# Patient Record
Sex: Male | Born: 1935
Health system: Southern US, Community
[De-identification: ages and names within clinical notes are randomized; demographics above are authoritative.]

## PROBLEM LIST (undated history)

## (undated) DIAGNOSIS — T8859XA Other complications of anesthesia, initial encounter: Secondary | ICD-10-CM

## (undated) DIAGNOSIS — R338 Other retention of urine: Secondary | ICD-10-CM

## (undated) DIAGNOSIS — G629 Polyneuropathy, unspecified: Secondary | ICD-10-CM

## (undated) DIAGNOSIS — N401 Enlarged prostate with lower urinary tract symptoms: Secondary | ICD-10-CM

## (undated) DIAGNOSIS — K259 Gastric ulcer, unspecified as acute or chronic, without hemorrhage or perforation: Secondary | ICD-10-CM

## (undated) DIAGNOSIS — C801 Malignant (primary) neoplasm, unspecified: Secondary | ICD-10-CM

## (undated) DIAGNOSIS — D649 Anemia, unspecified: Secondary | ICD-10-CM

## (undated) DIAGNOSIS — J449 Chronic obstructive pulmonary disease, unspecified: Secondary | ICD-10-CM

## (undated) DIAGNOSIS — I729 Aneurysm of unspecified site: Secondary | ICD-10-CM

## (undated) DIAGNOSIS — M1990 Unspecified osteoarthritis, unspecified site: Secondary | ICD-10-CM

## (undated) DIAGNOSIS — G2581 Restless legs syndrome: Secondary | ICD-10-CM

## (undated) DIAGNOSIS — D126 Benign neoplasm of colon, unspecified: Secondary | ICD-10-CM

## (undated) DIAGNOSIS — K222 Esophageal obstruction: Secondary | ICD-10-CM

## (undated) DIAGNOSIS — K219 Gastro-esophageal reflux disease without esophagitis: Secondary | ICD-10-CM

## (undated) DIAGNOSIS — K2270 Barrett's esophagus without dysplasia: Secondary | ICD-10-CM

## (undated) DIAGNOSIS — D352 Benign neoplasm of pituitary gland: Secondary | ICD-10-CM

## (undated) DIAGNOSIS — F419 Anxiety disorder, unspecified: Secondary | ICD-10-CM

## (undated) DIAGNOSIS — K5790 Diverticulosis of intestine, part unspecified, without perforation or abscess without bleeding: Secondary | ICD-10-CM

## (undated) DIAGNOSIS — K449 Diaphragmatic hernia without obstruction or gangrene: Secondary | ICD-10-CM

## (undated) DIAGNOSIS — K922 Gastrointestinal hemorrhage, unspecified: Secondary | ICD-10-CM

## (undated) DIAGNOSIS — T4145XA Adverse effect of unspecified anesthetic, initial encounter: Secondary | ICD-10-CM

## (undated) DIAGNOSIS — E538 Deficiency of other specified B group vitamins: Secondary | ICD-10-CM

## (undated) DIAGNOSIS — F329 Major depressive disorder, single episode, unspecified: Secondary | ICD-10-CM

## (undated) DIAGNOSIS — I4891 Unspecified atrial fibrillation: Secondary | ICD-10-CM

## (undated) DIAGNOSIS — E039 Hypothyroidism, unspecified: Secondary | ICD-10-CM

## (undated) DIAGNOSIS — I2510 Atherosclerotic heart disease of native coronary artery without angina pectoris: Secondary | ICD-10-CM

## (undated) DIAGNOSIS — J45909 Unspecified asthma, uncomplicated: Secondary | ICD-10-CM

## (undated) DIAGNOSIS — F32A Depression, unspecified: Secondary | ICD-10-CM

## (undated) DIAGNOSIS — R269 Unspecified abnormalities of gait and mobility: Secondary | ICD-10-CM

## (undated) DIAGNOSIS — H269 Unspecified cataract: Secondary | ICD-10-CM

## (undated) DIAGNOSIS — M810 Age-related osteoporosis without current pathological fracture: Secondary | ICD-10-CM

## (undated) HISTORY — PX: PROSTATE SURGERY: SHX751

## (undated) HISTORY — DX: Major depressive disorder, single episode, unspecified: F32.9

## (undated) HISTORY — DX: Benign neoplasm of colon, unspecified: D12.6

## (undated) HISTORY — DX: Diaphragmatic hernia without obstruction or gangrene: K44.9

## (undated) HISTORY — DX: Aneurysm of unspecified site: I72.9

## (undated) HISTORY — DX: Atherosclerotic heart disease of native coronary artery without angina pectoris: I25.10

## (undated) HISTORY — PX: JOINT REPLACEMENT: SHX530

## (undated) HISTORY — DX: Gastric ulcer, unspecified as acute or chronic, without hemorrhage or perforation: K25.9

## (undated) HISTORY — PX: CORONARY ANGIOPLASTY: SHX604

## (undated) HISTORY — DX: Restless legs syndrome: G25.81

## (undated) HISTORY — DX: Age-related osteoporosis without current pathological fracture: M81.0

## (undated) HISTORY — DX: Depression, unspecified: F32.A

## (undated) HISTORY — DX: Barrett's esophagus without dysplasia: K22.70

## (undated) HISTORY — DX: Unspecified cataract: H26.9

## (undated) HISTORY — PX: COLONOSCOPY: SHX174

## (undated) HISTORY — DX: Deficiency of other specified B group vitamins: E53.8

## (undated) HISTORY — DX: Anxiety disorder, unspecified: F41.9

## (undated) HISTORY — DX: Benign neoplasm of pituitary gland: D35.2

## (undated) HISTORY — PX: OTHER SURGICAL HISTORY: SHX169

## (undated) HISTORY — PX: HERNIA REPAIR: SHX51

## (undated) HISTORY — DX: Unspecified asthma, uncomplicated: J45.909

## (undated) HISTORY — DX: Unspecified abnormalities of gait and mobility: R26.9

## (undated) HISTORY — PX: POLYPECTOMY: SHX149

## (undated) HISTORY — DX: Diverticulosis of intestine, part unspecified, without perforation or abscess without bleeding: K57.90

## (undated) HISTORY — PX: FOOT SURGERY: SHX648

## (undated) HISTORY — DX: Esophageal obstruction: K22.2

---

## 1998-05-30 ENCOUNTER — Encounter: Payer: Self-pay | Admitting: Emergency Medicine

## 1998-05-30 ENCOUNTER — Emergency Department (HOSPITAL_COMMUNITY): Admission: EM | Admit: 1998-05-30 | Discharge: 1998-05-30 | Payer: Self-pay | Admitting: Emergency Medicine

## 2000-06-28 ENCOUNTER — Encounter: Payer: Self-pay | Admitting: Orthopedic Surgery

## 2000-06-28 ENCOUNTER — Inpatient Hospital Stay (HOSPITAL_COMMUNITY): Admission: RE | Admit: 2000-06-28 | Discharge: 2000-07-01 | Payer: Self-pay | Admitting: Orthopedic Surgery

## 2000-08-09 ENCOUNTER — Ambulatory Visit (HOSPITAL_COMMUNITY): Admission: RE | Admit: 2000-08-09 | Discharge: 2000-08-09 | Payer: Self-pay | Admitting: Orthopedic Surgery

## 2001-10-17 ENCOUNTER — Ambulatory Visit (HOSPITAL_COMMUNITY): Admission: RE | Admit: 2001-10-17 | Discharge: 2001-10-17 | Payer: Self-pay | Admitting: Family Medicine

## 2001-10-17 ENCOUNTER — Encounter: Payer: Self-pay | Admitting: Family Medicine

## 2001-10-19 ENCOUNTER — Encounter: Payer: Self-pay | Admitting: Family Medicine

## 2001-10-19 ENCOUNTER — Ambulatory Visit (HOSPITAL_COMMUNITY): Admission: RE | Admit: 2001-10-19 | Discharge: 2001-10-19 | Payer: Self-pay | Admitting: Family Medicine

## 2002-08-21 ENCOUNTER — Encounter: Payer: Self-pay | Admitting: Gastroenterology

## 2002-08-21 ENCOUNTER — Ambulatory Visit (HOSPITAL_COMMUNITY): Admission: RE | Admit: 2002-08-21 | Discharge: 2002-08-21 | Payer: Self-pay | Admitting: Gastroenterology

## 2004-06-03 ENCOUNTER — Emergency Department (HOSPITAL_COMMUNITY): Admission: EM | Admit: 2004-06-03 | Discharge: 2004-06-03 | Payer: Self-pay | Admitting: Emergency Medicine

## 2004-06-17 ENCOUNTER — Ambulatory Visit: Payer: Self-pay | Admitting: Cardiology

## 2005-01-13 ENCOUNTER — Ambulatory Visit: Payer: Self-pay | Admitting: Gastroenterology

## 2005-03-02 ENCOUNTER — Ambulatory Visit: Payer: Self-pay | Admitting: Cardiology

## 2005-03-04 ENCOUNTER — Ambulatory Visit: Payer: Self-pay

## 2005-03-16 ENCOUNTER — Ambulatory Visit (HOSPITAL_COMMUNITY): Admission: RE | Admit: 2005-03-16 | Discharge: 2005-03-17 | Payer: Self-pay | Admitting: General Surgery

## 2005-03-16 ENCOUNTER — Encounter (INDEPENDENT_AMBULATORY_CARE_PROVIDER_SITE_OTHER): Payer: Self-pay | Admitting: Specialist

## 2005-05-24 ENCOUNTER — Ambulatory Visit: Payer: Self-pay | Admitting: Gastroenterology

## 2005-05-29 LAB — HM COLONOSCOPY

## 2005-05-30 ENCOUNTER — Encounter (INDEPENDENT_AMBULATORY_CARE_PROVIDER_SITE_OTHER): Payer: Self-pay | Admitting: *Deleted

## 2005-05-30 ENCOUNTER — Ambulatory Visit: Payer: Self-pay | Admitting: Gastroenterology

## 2005-07-07 ENCOUNTER — Ambulatory Visit: Payer: Self-pay | Admitting: Gastroenterology

## 2005-08-03 ENCOUNTER — Emergency Department (HOSPITAL_COMMUNITY): Admission: EM | Admit: 2005-08-03 | Discharge: 2005-08-03 | Payer: Self-pay | Admitting: Emergency Medicine

## 2005-08-04 ENCOUNTER — Inpatient Hospital Stay (HOSPITAL_COMMUNITY): Admission: EM | Admit: 2005-08-04 | Discharge: 2005-08-05 | Payer: Self-pay | Admitting: Emergency Medicine

## 2005-08-09 ENCOUNTER — Ambulatory Visit: Payer: Self-pay | Admitting: Internal Medicine

## 2006-02-28 HISTORY — PX: ABDOMINAL HERNIA REPAIR: SHX539

## 2006-03-08 ENCOUNTER — Ambulatory Visit: Payer: Self-pay | Admitting: Cardiology

## 2006-03-20 ENCOUNTER — Ambulatory Visit: Payer: Self-pay

## 2006-03-31 DIAGNOSIS — I2510 Atherosclerotic heart disease of native coronary artery without angina pectoris: Secondary | ICD-10-CM

## 2006-03-31 HISTORY — PX: CORONARY STENT PLACEMENT: SHX1402

## 2006-03-31 HISTORY — DX: Atherosclerotic heart disease of native coronary artery without angina pectoris: I25.10

## 2006-04-25 ENCOUNTER — Ambulatory Visit: Payer: Self-pay | Admitting: Cardiology

## 2006-04-25 ENCOUNTER — Inpatient Hospital Stay (HOSPITAL_COMMUNITY): Admission: EM | Admit: 2006-04-25 | Discharge: 2006-04-26 | Payer: Self-pay | Admitting: Emergency Medicine

## 2006-05-07 ENCOUNTER — Inpatient Hospital Stay (HOSPITAL_COMMUNITY): Admission: EM | Admit: 2006-05-07 | Discharge: 2006-05-08 | Payer: Self-pay | Admitting: Emergency Medicine

## 2006-05-07 ENCOUNTER — Ambulatory Visit: Payer: Self-pay | Admitting: Cardiology

## 2006-05-16 ENCOUNTER — Ambulatory Visit: Payer: Self-pay | Admitting: Gastroenterology

## 2006-05-17 ENCOUNTER — Ambulatory Visit: Payer: Self-pay | Admitting: Gastroenterology

## 2006-05-18 ENCOUNTER — Ambulatory Visit: Payer: Self-pay | Admitting: Cardiology

## 2006-05-19 ENCOUNTER — Ambulatory Visit: Admission: RE | Admit: 2006-05-19 | Discharge: 2006-05-19 | Payer: Self-pay | Admitting: Gastroenterology

## 2006-07-12 ENCOUNTER — Ambulatory Visit: Payer: Self-pay | Admitting: Cardiology

## 2006-09-05 ENCOUNTER — Ambulatory Visit: Payer: Self-pay | Admitting: Internal Medicine

## 2006-09-05 LAB — CONVERTED CEMR LAB
Basophils Absolute: 0.1 10*3/uL (ref 0.0–0.1)
Basophils Relative: 0.9 % (ref 0.0–1.0)
Eosinophils Absolute: 0.1 10*3/uL (ref 0.0–0.6)
Eosinophils Relative: 1.7 % (ref 0.0–5.0)
HCT: 36.2 % — ABNORMAL LOW (ref 39.0–52.0)
Hemoglobin: 12.3 g/dL — ABNORMAL LOW (ref 13.0–17.0)
Lymphocytes Relative: 21.6 % (ref 12.0–46.0)
MCHC: 34.1 g/dL (ref 30.0–36.0)
MCV: 94.5 fL (ref 78.0–100.0)
Monocytes Absolute: 0.7 10*3/uL (ref 0.2–0.7)
Monocytes Relative: 12.1 % — ABNORMAL HIGH (ref 3.0–11.0)
Neutro Abs: 3.9 10*3/uL (ref 1.4–7.7)
Neutrophils Relative %: 63.7 % (ref 43.0–77.0)
Platelets: 197 10*3/uL (ref 150–400)
RBC: 3.82 M/uL — ABNORMAL LOW (ref 4.22–5.81)
RDW: 13.3 % (ref 11.5–14.6)
WBC: 6.1 10*3/uL (ref 4.5–10.5)

## 2006-09-27 ENCOUNTER — Ambulatory Visit: Payer: Self-pay | Admitting: Internal Medicine

## 2006-10-11 ENCOUNTER — Ambulatory Visit: Payer: Self-pay | Admitting: Cardiology

## 2006-11-06 ENCOUNTER — Ambulatory Visit: Payer: Self-pay | Admitting: Internal Medicine

## 2007-01-18 ENCOUNTER — Ambulatory Visit: Payer: Self-pay | Admitting: Cardiology

## 2007-01-22 ENCOUNTER — Ambulatory Visit: Payer: Self-pay | Admitting: Cardiology

## 2007-01-22 LAB — CONVERTED CEMR LAB
BUN: 10 mg/dL (ref 6–23)
Basophils Absolute: 0.1 10*3/uL (ref 0.0–0.1)
Basophils Relative: 1.1 % — ABNORMAL HIGH (ref 0.0–1.0)
CO2: 27 meq/L (ref 19–32)
Calcium: 9 mg/dL (ref 8.4–10.5)
Chloride: 106 meq/L (ref 96–112)
Creatinine, Ser: 1 mg/dL (ref 0.4–1.5)
Eosinophils Absolute: 0.1 10*3/uL (ref 0.0–0.6)
Eosinophils Relative: 1.9 % (ref 0.0–5.0)
GFR calc Af Amer: 94 mL/min
GFR calc non Af Amer: 78 mL/min
Glucose, Bld: 157 mg/dL — ABNORMAL HIGH (ref 70–99)
HCT: 36.1 % — ABNORMAL LOW (ref 39.0–52.0)
Hemoglobin: 12.3 g/dL — ABNORMAL LOW (ref 13.0–17.0)
INR: 0.9 (ref 0.8–1.0)
Lymphocytes Relative: 21.9 % (ref 12.0–46.0)
MCHC: 34.1 g/dL (ref 30.0–36.0)
MCV: 94.5 fL (ref 78.0–100.0)
Monocytes Absolute: 0.5 10*3/uL (ref 0.2–0.7)
Monocytes Relative: 7.6 % (ref 3.0–11.0)
Neutro Abs: 4.5 10*3/uL (ref 1.4–7.7)
Neutrophils Relative %: 67.5 % (ref 43.0–77.0)
Platelets: 204 10*3/uL (ref 150–400)
Potassium: 3.9 meq/L (ref 3.5–5.1)
Prothrombin Time: 11.2 s (ref 10.9–13.3)
RBC: 3.82 M/uL — ABNORMAL LOW (ref 4.22–5.81)
RDW: 13.6 % (ref 11.5–14.6)
Sodium: 138 meq/L (ref 135–145)
WBC: 6.7 10*3/uL (ref 4.5–10.5)
aPTT: 31.4 s — ABNORMAL HIGH (ref 21.7–29.8)

## 2007-01-29 ENCOUNTER — Ambulatory Visit: Payer: Self-pay | Admitting: Internal Medicine

## 2007-01-29 ENCOUNTER — Inpatient Hospital Stay (HOSPITAL_BASED_OUTPATIENT_CLINIC_OR_DEPARTMENT_OTHER): Admission: RE | Admit: 2007-01-29 | Discharge: 2007-01-29 | Payer: Self-pay | Admitting: Cardiology

## 2007-01-29 HISTORY — PX: CARDIAC CATHETERIZATION: SHX172

## 2007-02-05 ENCOUNTER — Telehealth (INDEPENDENT_AMBULATORY_CARE_PROVIDER_SITE_OTHER): Payer: Self-pay | Admitting: *Deleted

## 2007-02-15 ENCOUNTER — Ambulatory Visit: Payer: Self-pay | Admitting: Cardiology

## 2007-05-10 ENCOUNTER — Encounter: Payer: Self-pay | Admitting: Internal Medicine

## 2007-05-16 ENCOUNTER — Ambulatory Visit: Payer: Self-pay | Admitting: Cardiology

## 2007-05-16 ENCOUNTER — Encounter: Payer: Self-pay | Admitting: Internal Medicine

## 2007-05-16 ENCOUNTER — Ambulatory Visit (HOSPITAL_COMMUNITY): Admission: RE | Admit: 2007-05-16 | Discharge: 2007-05-16 | Payer: Self-pay | Admitting: Family Medicine

## 2007-05-18 ENCOUNTER — Emergency Department (HOSPITAL_COMMUNITY): Admission: EM | Admit: 2007-05-18 | Discharge: 2007-05-18 | Payer: Self-pay | Admitting: Emergency Medicine

## 2007-05-22 ENCOUNTER — Encounter: Payer: Self-pay | Admitting: Endocrinology

## 2007-06-05 ENCOUNTER — Ambulatory Visit: Payer: Self-pay | Admitting: Gastroenterology

## 2007-06-07 ENCOUNTER — Encounter: Payer: Self-pay | Admitting: Endocrinology

## 2007-06-14 ENCOUNTER — Encounter: Admission: RE | Admit: 2007-06-14 | Discharge: 2007-06-14 | Payer: Self-pay | Admitting: Neurosurgery

## 2007-06-22 DIAGNOSIS — E785 Hyperlipidemia, unspecified: Secondary | ICD-10-CM | POA: Insufficient documentation

## 2007-06-22 DIAGNOSIS — J449 Chronic obstructive pulmonary disease, unspecified: Secondary | ICD-10-CM | POA: Insufficient documentation

## 2007-06-22 DIAGNOSIS — I2510 Atherosclerotic heart disease of native coronary artery without angina pectoris: Secondary | ICD-10-CM | POA: Insufficient documentation

## 2007-06-22 DIAGNOSIS — J4489 Other specified chronic obstructive pulmonary disease: Secondary | ICD-10-CM | POA: Insufficient documentation

## 2007-06-25 ENCOUNTER — Ambulatory Visit: Payer: Self-pay | Admitting: Endocrinology

## 2007-06-25 DIAGNOSIS — E039 Hypothyroidism, unspecified: Secondary | ICD-10-CM | POA: Insufficient documentation

## 2007-06-25 DIAGNOSIS — D353 Benign neoplasm of craniopharyngeal duct: Secondary | ICD-10-CM

## 2007-06-25 DIAGNOSIS — D352 Benign neoplasm of pituitary gland: Secondary | ICD-10-CM | POA: Insufficient documentation

## 2007-07-04 ENCOUNTER — Encounter: Payer: Self-pay | Admitting: Endocrinology

## 2007-07-05 ENCOUNTER — Encounter: Payer: Self-pay | Admitting: Gastroenterology

## 2007-07-05 ENCOUNTER — Encounter: Payer: Self-pay | Admitting: Internal Medicine

## 2007-07-11 ENCOUNTER — Encounter: Admission: RE | Admit: 2007-07-11 | Discharge: 2007-07-11 | Payer: Self-pay | Admitting: Surgery

## 2007-07-12 ENCOUNTER — Ambulatory Visit: Payer: Self-pay

## 2007-07-19 ENCOUNTER — Telehealth (INDEPENDENT_AMBULATORY_CARE_PROVIDER_SITE_OTHER): Payer: Self-pay | Admitting: *Deleted

## 2007-07-25 ENCOUNTER — Ambulatory Visit: Payer: Self-pay | Admitting: Internal Medicine

## 2007-07-26 ENCOUNTER — Ambulatory Visit: Payer: Self-pay | Admitting: Endocrinology

## 2007-07-26 LAB — CONVERTED CEMR LAB
Cortisol, Plasma: 1.5 ug/dL
Prolactin: 35.3 ng/mL
TSH: 1.38 microintl units/mL (ref 0.35–5.50)

## 2007-07-30 ENCOUNTER — Telehealth: Payer: Self-pay | Admitting: Endocrinology

## 2007-08-01 ENCOUNTER — Ambulatory Visit: Payer: Self-pay | Admitting: Cardiology

## 2007-08-06 ENCOUNTER — Telehealth: Payer: Self-pay | Admitting: Gastroenterology

## 2007-08-15 ENCOUNTER — Ambulatory Visit: Payer: Self-pay | Admitting: Internal Medicine

## 2007-08-15 DIAGNOSIS — R0602 Shortness of breath: Secondary | ICD-10-CM | POA: Insufficient documentation

## 2007-08-21 ENCOUNTER — Telehealth: Payer: Self-pay | Admitting: Internal Medicine

## 2007-09-04 ENCOUNTER — Ambulatory Visit: Admission: RE | Admit: 2007-09-04 | Discharge: 2007-09-04 | Payer: Self-pay | Admitting: Surgery

## 2007-09-10 ENCOUNTER — Ambulatory Visit: Payer: Self-pay | Admitting: Cardiology

## 2007-09-10 ENCOUNTER — Inpatient Hospital Stay (HOSPITAL_COMMUNITY): Admission: EM | Admit: 2007-09-10 | Discharge: 2007-09-12 | Payer: Self-pay | Admitting: Emergency Medicine

## 2007-09-10 ENCOUNTER — Ambulatory Visit: Payer: Self-pay | Admitting: Internal Medicine

## 2007-09-11 ENCOUNTER — Encounter: Payer: Self-pay | Admitting: Cardiology

## 2007-09-14 ENCOUNTER — Ambulatory Visit: Payer: Self-pay | Admitting: Cardiology

## 2007-10-15 ENCOUNTER — Telehealth: Payer: Self-pay | Admitting: Gastroenterology

## 2007-10-16 ENCOUNTER — Ambulatory Visit: Payer: Self-pay | Admitting: Cardiology

## 2007-10-24 ENCOUNTER — Ambulatory Visit: Payer: Self-pay | Admitting: Endocrinology

## 2007-10-24 DIAGNOSIS — I9589 Other hypotension: Secondary | ICD-10-CM | POA: Insufficient documentation

## 2007-10-24 DIAGNOSIS — E221 Hyperprolactinemia: Secondary | ICD-10-CM | POA: Insufficient documentation

## 2007-10-24 LAB — CONVERTED CEMR LAB
BUN: 10 mg/dL (ref 6–23)
CO2: 26 meq/L (ref 19–32)
Calcium: 8.5 mg/dL (ref 8.4–10.5)
Chloride: 114 meq/L — ABNORMAL HIGH (ref 96–112)
Cortisol, Plasma: 24 ug/dL
Cortisol, Plasma: 8.1 ug/dL
Creatinine, Ser: 1 mg/dL (ref 0.4–1.5)
Free T4: 0.9 ng/dL (ref 0.6–1.6)
GFR calc Af Amer: 94 mL/min
GFR calc non Af Amer: 78 mL/min
Glucose, Bld: 108 mg/dL — ABNORMAL HIGH (ref 70–99)
Potassium: 3.8 meq/L (ref 3.5–5.1)
Prolactin: 10.3 ng/mL
Sodium: 141 meq/L (ref 135–145)
TSH: 1.81 microintl units/mL (ref 0.35–5.50)

## 2007-10-29 ENCOUNTER — Encounter: Admission: RE | Admit: 2007-10-29 | Discharge: 2007-10-29 | Payer: Self-pay | Admitting: Endocrinology

## 2007-11-08 ENCOUNTER — Encounter: Admission: RE | Admit: 2007-11-08 | Discharge: 2008-01-09 | Payer: Self-pay | Admitting: Family Medicine

## 2007-11-22 ENCOUNTER — Ambulatory Visit: Payer: Self-pay | Admitting: Internal Medicine

## 2007-11-23 ENCOUNTER — Encounter: Payer: Self-pay | Admitting: Internal Medicine

## 2007-11-23 ENCOUNTER — Encounter: Payer: Self-pay | Admitting: Endocrinology

## 2007-11-27 ENCOUNTER — Encounter: Admission: RE | Admit: 2007-11-27 | Discharge: 2007-11-27 | Payer: Self-pay | Admitting: Family Medicine

## 2008-01-04 ENCOUNTER — Encounter (INDEPENDENT_AMBULATORY_CARE_PROVIDER_SITE_OTHER): Payer: Self-pay | Admitting: Surgery

## 2008-01-04 ENCOUNTER — Inpatient Hospital Stay (HOSPITAL_COMMUNITY): Admission: RE | Admit: 2008-01-04 | Discharge: 2008-01-07 | Payer: Self-pay | Admitting: Surgery

## 2008-01-04 HISTORY — PX: HIATAL HERNIA REPAIR: SHX195

## 2008-01-05 ENCOUNTER — Encounter (INDEPENDENT_AMBULATORY_CARE_PROVIDER_SITE_OTHER): Payer: Self-pay | Admitting: *Deleted

## 2008-01-08 ENCOUNTER — Inpatient Hospital Stay (HOSPITAL_COMMUNITY): Admission: EM | Admit: 2008-01-08 | Discharge: 2008-01-10 | Payer: Self-pay | Admitting: *Deleted

## 2008-01-31 ENCOUNTER — Encounter: Payer: Self-pay | Admitting: Gastroenterology

## 2008-02-06 ENCOUNTER — Ambulatory Visit: Payer: Self-pay | Admitting: Cardiology

## 2008-03-21 ENCOUNTER — Encounter: Admission: RE | Admit: 2008-03-21 | Discharge: 2008-03-21 | Payer: Self-pay | Admitting: Neurosurgery

## 2008-04-17 ENCOUNTER — Encounter: Payer: Self-pay | Admitting: Gastroenterology

## 2008-04-17 ENCOUNTER — Encounter: Payer: Self-pay | Admitting: Internal Medicine

## 2008-05-19 ENCOUNTER — Ambulatory Visit: Payer: Self-pay | Admitting: Internal Medicine

## 2008-07-02 ENCOUNTER — Telehealth: Payer: Self-pay | Admitting: Endocrinology

## 2008-07-17 DIAGNOSIS — D126 Benign neoplasm of colon, unspecified: Secondary | ICD-10-CM

## 2008-07-17 DIAGNOSIS — N400 Enlarged prostate without lower urinary tract symptoms: Secondary | ICD-10-CM | POA: Insufficient documentation

## 2008-07-17 DIAGNOSIS — D649 Anemia, unspecified: Secondary | ICD-10-CM | POA: Insufficient documentation

## 2008-07-17 DIAGNOSIS — K2100 Gastro-esophageal reflux disease with esophagitis, without bleeding: Secondary | ICD-10-CM | POA: Insufficient documentation

## 2008-07-17 DIAGNOSIS — M1990 Unspecified osteoarthritis, unspecified site: Secondary | ICD-10-CM | POA: Insufficient documentation

## 2008-07-17 DIAGNOSIS — K5730 Diverticulosis of large intestine without perforation or abscess without bleeding: Secondary | ICD-10-CM | POA: Insufficient documentation

## 2008-07-17 DIAGNOSIS — M810 Age-related osteoporosis without current pathological fracture: Secondary | ICD-10-CM | POA: Insufficient documentation

## 2008-07-17 DIAGNOSIS — I959 Hypotension, unspecified: Secondary | ICD-10-CM | POA: Insufficient documentation

## 2008-07-17 DIAGNOSIS — D539 Nutritional anemia, unspecified: Secondary | ICD-10-CM | POA: Insufficient documentation

## 2008-07-17 HISTORY — DX: Benign neoplasm of colon, unspecified: D12.6

## 2008-08-05 ENCOUNTER — Telehealth: Payer: Self-pay | Admitting: Endocrinology

## 2008-08-20 ENCOUNTER — Ambulatory Visit: Payer: Self-pay | Admitting: Cardiology

## 2008-08-21 ENCOUNTER — Encounter: Payer: Self-pay | Admitting: Endocrinology

## 2008-08-21 ENCOUNTER — Encounter: Payer: Self-pay | Admitting: Internal Medicine

## 2008-09-05 ENCOUNTER — Telehealth: Payer: Self-pay | Admitting: Endocrinology

## 2008-09-06 ENCOUNTER — Encounter: Admission: RE | Admit: 2008-09-06 | Discharge: 2008-09-06 | Payer: Self-pay | Admitting: Neurosurgery

## 2008-09-11 ENCOUNTER — Ambulatory Visit: Payer: Self-pay | Admitting: Endocrinology

## 2008-09-12 LAB — CONVERTED CEMR LAB
Free T4: 0.9 ng/dL (ref 0.6–1.6)
Prolactin: 10.8 ng/mL
TSH: 1.61 microintl units/mL (ref 0.35–5.50)

## 2008-10-06 ENCOUNTER — Telehealth: Payer: Self-pay | Admitting: Endocrinology

## 2008-10-22 ENCOUNTER — Telehealth: Payer: Self-pay | Admitting: Internal Medicine

## 2008-12-22 ENCOUNTER — Telehealth: Payer: Self-pay | Admitting: Internal Medicine

## 2009-01-01 ENCOUNTER — Encounter: Payer: Self-pay | Admitting: Endocrinology

## 2009-03-03 ENCOUNTER — Telehealth: Payer: Self-pay | Admitting: Endocrinology

## 2009-06-25 ENCOUNTER — Ambulatory Visit: Payer: Self-pay | Admitting: Internal Medicine

## 2009-08-07 ENCOUNTER — Ambulatory Visit: Payer: Self-pay | Admitting: Cardiology

## 2009-08-07 ENCOUNTER — Inpatient Hospital Stay (HOSPITAL_COMMUNITY): Admission: EM | Admit: 2009-08-07 | Discharge: 2009-08-15 | Payer: Self-pay | Admitting: Emergency Medicine

## 2009-08-07 ENCOUNTER — Ambulatory Visit: Payer: Self-pay | Admitting: Gastroenterology

## 2009-08-08 ENCOUNTER — Encounter: Payer: Self-pay | Admitting: Gastroenterology

## 2009-08-09 ENCOUNTER — Encounter: Payer: Self-pay | Admitting: Gastroenterology

## 2009-08-09 DIAGNOSIS — K922 Gastrointestinal hemorrhage, unspecified: Secondary | ICD-10-CM

## 2009-08-09 HISTORY — DX: Gastrointestinal hemorrhage, unspecified: K92.2

## 2009-08-10 ENCOUNTER — Encounter: Payer: Self-pay | Admitting: Cardiology

## 2009-08-10 ENCOUNTER — Encounter: Payer: Self-pay | Admitting: Internal Medicine

## 2009-08-14 ENCOUNTER — Encounter: Payer: Self-pay | Admitting: Internal Medicine

## 2009-08-25 ENCOUNTER — Ambulatory Visit: Payer: Self-pay | Admitting: Gastroenterology

## 2009-08-25 DIAGNOSIS — K219 Gastro-esophageal reflux disease without esophagitis: Secondary | ICD-10-CM | POA: Insufficient documentation

## 2009-08-25 DIAGNOSIS — K921 Melena: Secondary | ICD-10-CM | POA: Insufficient documentation

## 2009-08-26 LAB — CONVERTED CEMR LAB
Basophils Absolute: 0 10*3/uL (ref 0.0–0.1)
Basophils Relative: 0.7 % (ref 0.0–3.0)
Eosinophils Absolute: 0.1 10*3/uL (ref 0.0–0.7)
Eosinophils Relative: 2 % (ref 0.0–5.0)
Ferritin: 42.8 ng/mL (ref 22.0–322.0)
Folate: 7.9 ng/mL
HCT: 31.9 % — ABNORMAL LOW (ref 39.0–52.0)
Hemoglobin: 10.7 g/dL — ABNORMAL LOW (ref 13.0–17.0)
Iron: 72 ug/dL (ref 42–165)
Lymphocytes Relative: 21.3 % (ref 12.0–46.0)
Lymphs Abs: 1.6 10*3/uL (ref 0.7–4.0)
MCHC: 33.7 g/dL (ref 30.0–36.0)
MCV: 90.7 fL (ref 78.0–100.0)
Monocytes Absolute: 0.9 10*3/uL (ref 0.1–1.0)
Monocytes Relative: 12.4 % — ABNORMAL HIGH (ref 3.0–12.0)
Neutro Abs: 4.7 10*3/uL (ref 1.4–7.7)
Neutrophils Relative %: 63.6 % (ref 43.0–77.0)
Platelets: 313 10*3/uL (ref 150.0–400.0)
RBC: 3.51 M/uL — ABNORMAL LOW (ref 4.22–5.81)
RDW: 18.2 % — ABNORMAL HIGH (ref 11.5–14.6)
Saturation Ratios: 22 % (ref 20.0–50.0)
Transferrin: 233.3 mg/dL (ref 212.0–360.0)
Vitamin B-12: 495 pg/mL (ref 211–911)
WBC: 7.4 10*3/uL (ref 4.5–10.5)

## 2009-09-01 ENCOUNTER — Encounter: Payer: Self-pay | Admitting: Gastroenterology

## 2009-09-01 ENCOUNTER — Telehealth: Payer: Self-pay | Admitting: Endocrinology

## 2009-09-01 ENCOUNTER — Encounter: Payer: Self-pay | Admitting: Cardiology

## 2009-09-01 ENCOUNTER — Telehealth: Payer: Self-pay | Admitting: Gastroenterology

## 2009-09-04 ENCOUNTER — Telehealth: Payer: Self-pay | Admitting: Endocrinology

## 2009-09-15 ENCOUNTER — Inpatient Hospital Stay (HOSPITAL_COMMUNITY): Admission: AD | Admit: 2009-09-15 | Discharge: 2009-09-19 | Payer: Self-pay | Admitting: Endocrinology

## 2009-09-15 ENCOUNTER — Ambulatory Visit: Payer: Self-pay | Admitting: Endocrinology

## 2009-09-15 DIAGNOSIS — R509 Fever, unspecified: Secondary | ICD-10-CM | POA: Insufficient documentation

## 2009-09-22 ENCOUNTER — Encounter: Payer: Self-pay | Admitting: Cardiology

## 2009-10-07 ENCOUNTER — Ambulatory Visit: Payer: Self-pay | Admitting: Cardiology

## 2009-10-08 ENCOUNTER — Encounter: Admission: RE | Admit: 2009-10-08 | Discharge: 2009-10-08 | Payer: Self-pay | Admitting: Neurosurgery

## 2009-10-25 ENCOUNTER — Ambulatory Visit: Payer: Self-pay | Admitting: Gastroenterology

## 2009-11-09 ENCOUNTER — Telehealth: Payer: Self-pay | Admitting: Endocrinology

## 2009-11-09 ENCOUNTER — Encounter: Payer: Self-pay | Admitting: Gastroenterology

## 2009-11-17 ENCOUNTER — Ambulatory Visit: Payer: Self-pay | Admitting: Gastroenterology

## 2009-11-17 DIAGNOSIS — K922 Gastrointestinal hemorrhage, unspecified: Secondary | ICD-10-CM | POA: Insufficient documentation

## 2009-12-07 ENCOUNTER — Encounter: Payer: Self-pay | Admitting: Gastroenterology

## 2009-12-07 ENCOUNTER — Encounter: Payer: Self-pay | Admitting: Cardiology

## 2009-12-14 ENCOUNTER — Telehealth: Payer: Self-pay | Admitting: Endocrinology

## 2009-12-15 ENCOUNTER — Ambulatory Visit: Payer: Self-pay | Admitting: Endocrinology

## 2009-12-15 LAB — CONVERTED CEMR LAB: Prolactin: 10.8 ng/mL (ref 2.1–17.1)

## 2009-12-29 LAB — HM DEXA SCAN

## 2010-01-20 ENCOUNTER — Ambulatory Visit: Payer: Self-pay | Admitting: Cardiology

## 2010-02-04 ENCOUNTER — Inpatient Hospital Stay (HOSPITAL_COMMUNITY): Admission: EM | Admit: 2010-02-04 | Discharge: 2009-10-30 | Payer: Self-pay | Admitting: Emergency Medicine

## 2010-03-22 ENCOUNTER — Encounter: Payer: Self-pay | Admitting: Family Medicine

## 2010-03-30 NOTE — Procedures (Signed)
Summary: Upper Endoscopy  Patient: [name] Note: All result statuses are Final unless otherwise noted.  Tests: (1) Upper Endoscopy (EGD)   EGD Upper Endoscopy       DONE     [facility]     [address]     [place], [place]  [address]           ENDOSCOPY PROCEDURE REPORT           PATIENT:  [name], [name]  MR#:  [id number]     BIRTHDATE:  25-Sep-1932  GENDER:     ENDOSCOPIST:  Rachael Fee, MD     PROCEDURE DATE:  10/25/2009     PROCEDURE:  EGD for control of bleeding     ASA CLASS:  Class III     INDICATIONS:  hematemsis, recent 7 unit UGI bleed (June) from     distal esophagus ulcer though possibly due to Alendronate     MEDICATIONS:  Fentanyl 50 mcg IV, Versed 5 mg IV     TOPICAL ANESTHETIC:  Cetacaine Spray     DESCRIPTION OF PROCEDURE:   After the risks benefits and     alternatives of the procedure were thoroughly explained, informed     consent was obtained.  The  endoscope was introduced through the     mouth and advanced to the second portion of the duodenum, without     limitations.  The instrument was slowly withdrawn as the mucosa     was fully examined.           <<PROCEDUREIMAGES>>           Just proximal to GE junction there was an oozing, visible vessel.     The previously noted ulcer in distal esophagus has healed. The     vessel was treated with placement of two endoclips (see image2,     image3, and image4). There was a large amount of fresh blood and     blood clot in his stomach    Retroflexed views revealed not done.     The scope was then withdrawn from the patient and the procedure     completed.     COMPLICATIONS:  None           ENDOSCOPIC IMPRESSION:     Oozing visible vessel just above GE junction.  Previously noted     ulcer has healed, this vessel was treated with two endoclips.           RECOMMENDATIONS:     BID PPI IV for now, then PO for at least two more months, then     probably OK to decrease to once  daily)     Observe in ICU for rebleeding           _____________________________     Rachael Fee, MD           cc: Sheryn Bison, MD           n.     eSIGNED:   Rachael Fee at 10/25/2009 03:44 PM           Biagio Borg, [id number]  Note: An exclamation mark (!) indicates a result that was not dispersed into the flowsheet. Document Creation Date: 10/25/2009 4:16 PM _______________________________________________________________________  (1) Order result status: Final Collection or observation date-time: 10/25/2009 15:37 Requested date-time:  Receipt date-time:  Reported date-time:  Referring Physician:   Ordering Physician: Rob Bunting [phone]) Specimen Source:  Source: Launa Grill Order Number: [id number] Lab site:

## 2010-03-30 NOTE — Procedures (Signed)
Summary: Upper Endoscopy  Patient: [name] Note: All result statuses are Final unless otherwise noted.  Tests: (1) Upper Endoscopy (EGD)   EGD Upper Endoscopy       DONE      [facility]     [address]     [place], [place]  [address]           ENDOSCOPY PROCEDURE REPORT           PATIENT:  [name], [name]  MR#:  [id number]     BIRTHDATE:  05/05/1932, 76 yrs. old  GENDER:  male           ENDOSCOPIST:  Barbette Hair. Arlyce Dice, MD     Referred by:           PROCEDURE DATE:  08/08/2009     PROCEDURE:  EGD with biopsy     ASA CLASS:  Class II     INDICATIONS:  melena           MEDICATIONS:   Fentanyl 25 mcg IV, Versed 3 mg IV     TOPICAL ANESTHETIC:  Cetacaine Spray           DESCRIPTION OF PROCEDURE:   After the risks benefits and     alternatives of the procedure were thoroughly explained, informed     consent was obtained.  The EG-[id number] ([id number]) endoscope was     introduced through the mouth and advanced to the third portion of     the duodenum, without limitations.  The instrument was slowly     withdrawn as the mucosa was fully examined.     <<PROCEDUREIMAGES>>           A stricture was found at the gastroesophageal junction (see     image001). Early esophageal stricture  Esophagitis was found at     the gastroesophageal junction. Erosive esohagitis at GE junction.     No fresh or old blood. There was prominent mucosa on the gastric     side that was biopsied (see image007, image006, and image008).     other findings in the antrum (see image005). pancreatic rest     Otherwise the examination was normal.    Retroflexed views revealed     no abnormalities.    The scope was then withdrawn from the patient     and the procedure completed.           COMPLICATIONS:  None           ENDOSCOPIC IMPRESSION:     1) Stricture at the gastroesophageal junction     2) Esophagitis at the gastroesophageal junction     3) Other findings in the antrum - pancreatic  rest     4) Otherwise normal examination           GI bleeding probably secondary to erosive esophagitis     RECOMMENDATIONS:     1) continue PPI; OK to continue plavix     2) Await biopsy results     3) if GI bleeding persists, t/c colonoscopy           REPEAT EXAM:  No           ______________________________     Barbette Hair. Arlyce Dice, MD           CC:  Rollene Rotunda, MD           n.     Rosalie Doctor:   Barbette Hair. Roxene Alviar at 08/08/2009  09:58 AM           [place], [id number]  Note: An exclamation mark (!) indicates a result that was not dispersed into the flowsheet. Document Creation Date: 08/08/2009 9:59 AM _______________________________________________________________________  (1) Order result status: Final Collection or observation date-time: 08/08/2009 09:53 Requested date-time:  Receipt date-time:  Reported date-time:  Referring Physician:   Ordering Physician: Melvia Heaps [phone]) Specimen Source:  Source: Launa Grill Order Number: [id number] Lab site:

## 2010-03-30 NOTE — Assessment & Plan Note (Signed)
Summary: [facility]  Medications Added MUCINEX 600 MG XR12H-TAB (GUAIFENESIN) 1 by mouth daily      Allergies Added:   Visit Type:  Follow-up Primary Provider:  Rudi Heap, MD  CC:  CAD.  History of Present Illness: The patient presents for evaluation of his known coronary disease. He's been off of Plavix secondary to bleeding birth related to a Mallory-Weiss tear and since I saw him a visible esophageal vessel which needed to be clipped. He is slowly recovering from all of this. He is participating in rehabilitation. He has occasional episodes of feeling unsteady on his feet. He is not describing any chest pressure, neck or arm discomfort. He is not noticing any palpitations. He does have some more anxiety and insomnia and thinks he is a little more confused during the day then he has been previously.  Current Medications (verified): 1)  Singulair 10 Mg  Tabs (Montelukast Sodium) .... Take 1 By Mouth Once Daily As Needed 2)  Pravachol 40 Mg  Tabs (Pravastatin Sodium) .... Take 1 By Mouth Qd 3)  Nitroglycerin 0.4 Mg  Subl (Nitroglycerin) .... Use Prn 4)  Aspirin 81 Mg  Tbec (Aspirin) .... Take 1 By Mouth Qd 5)  Synthroid 50 Mcg  Tabs (Levothyroxine Sodium) .... Qd 6)  Proventil Hfa 108 (90 Base) Mcg/act Aers (Albuterol Sulfate) .... 2 Puffs Four Times A Day As Needed 7)  Fish Oil 1200 Mg Caps (Omega-3 Fatty Acids) .... Take 1 By Mouth Once Daily 8)  Vitamin D 1000 Unit Tabs (Cholecalciferol) .... Take 2 By Mouth Once Daily 9)  Meclizine Hcl 25 Mg Tabs (Meclizine Hcl) .Marland Kitchen.. 1 Two Times A Day As Needed Vertigo 10)  Docusate Sodium 100 Mg Caps (Docusate Sodium) .... Take 1 By Mouth Once Daily 11)  Pantoprazole Sodium 40 Mg Tbec (Pantoprazole Sodium) .... Take One By Mouth Once Daily 12)  Iron 325 (65 Fe) Mg Tabs (Ferrous Sulfate) .... Take One By Mouth Two Times A Day 13)  Multivitamins   Tabs (Multiple Vitamin) .Marland Kitchen.. 1 By Mouth Daily 14)  Bromocriptine Mesylate 2.5 Mg Tabs  (Bromocriptine Mesylate) .Marland Kitchen.. 1 Tab At Bedtime 15)  Mucinex 600 Mg Xr12h-Tab (Guaifenesin) .Marland Kitchen.. 1 By Mouth Daily  Allergies (verified): 1)  ! Pcn  Past History:  Past Medical History: HYPOTENSION (ICD-458.9) DYSPNEA (ICD-786.05) HYPERLIPIDEMIA (ICD-272.4) CORONARY ARTERY DISEASE (ICD-414.00) BENIGN PROSTATIC HYPERTROPHY, HX OF (ICD-V13.8) DIVERTICULOSIS, MILD (ICD-562.10) OSTEOPOROSIS (ICD-733.00) DEGENERATIVE JOINT DISEASE (ICD-715.90) ESOPHAGITIS (ICD-530.10) ANEMIA (ICD-285.9) (GI bleed, MW tear and clipping of visible vessel 2011) COLONIC POLYPS (ICD-211.3) HYPERPROLACTINEMIA (ICD-253.1) OTHER SPECIFIED HYPOTENSION (ICD-458.8) HYPOTHYROIDISM (ICD-244.9) PITUITARY ADENOMA (ICD-227.3) COPD (ICD-496) Bonchiectasis RLL- CT 2009  Past Surgical History: Reviewed history from 10/07/2009 and no changes required. Inguinal herniorrhaphy PTCA/stent LEFT & RIGHT FOOT RECONSTRUCTION Hiatal hernia repair 2009  Review of Systems       As stated in the HPI and negative for all other systems.   Vital Signs:  Patient profile:   77 year old male Height:      67 inches Weight:      169 pounds BMI:     26.56 Pulse rate:   67 / minute Resp:     16 per minute BP sitting:   98 / 62  (right arm)  Vitals Entered By: [name] Coy, CNA (January 20, 2010 1:08 PM)  Physical Exam  General:  Well developed, well nourished, in no acute distress. Head:  Normocephalic and atraumatic. Eyes:  PERRLA/EOM intact; conjunctiva and lids normal. Neck:  Neck supple,  no JVD. No masses, thyromegaly or abnormal cervical nodes. Chest Wall:  no deformities Lungs:  Clear bilaterally to auscultation and percussion. Abdomen:  Bowel sounds positive; abdomen soft and non-tender without masses, organomegaly, or hernias noted. No hepatosplenomegaly. Msk:  Back normal, normal gait. Muscle strength and tone normal. Extremities:  No clubbing or cyanosis. Neurologic:  Alert and oriented x 3. Skin:  Intact  without lesions or rashes. Cervical Nodes:  no significant adenopathy Psych:  Normal affect.   Detailed Cardiovascular Exam  Neck    Carotids: Carotids full and equal bilaterally without bruits.      Neck Veins: Normal, no JVD.    Heart    Inspection: no deformities or lifts noted.      Palpation: normal PMI with no thrills palpable.      Auscultation: regular rate and rhythm, S1, S2 without murmurs, rubs, gallops, or clicks.    Vascular    Abdominal Aorta: no palpable masses, pulsations, or audible bruits.      Femoral Pulses: normal femoral pulses bilaterally.      Pedal Pulses: normal pedal pulses bilaterally.      Radial Pulses: normal radial pulses bilaterally.      Peripheral Circulation: no clubbing, cyanosis, or edema noted with normal capillary refill.     EKG  Procedure date:  01/20/2010  Findings:      Sinus rhythm, rate 68, axis within normal limits, intervals within normal limits, no acute ST-T wave changes  Impression & Recommendations:  Problem # 1:  CORONARY ARTERY DISEASE (ICD-414.00) He is having no ongoing symptoms. I think it is most prudent to continue to hold Plavix given his recent GI bleeds. Orders: EKG w/ Interpretation (93000)  Problem # 2:  HYPOTENSION (ICD-458.9) He continues to have a low blood pressure but no overt for this. At this point no change in therapy is indicated.  Problem # 3:  HYPERLIPIDEMIA (ICD-272.4) Per Dr. Christell Constant with a goal LDL less than 100 and HDL greater than 40.  Patient Instructions: 1)  Your physician recommends that you schedule a follow-up appointment in: 6 month with Dr Antoine Poche in [place] 2)  Your physician recommends that you continue on your current medications as directed. Please refer to the Current Medication list given to you today.

## 2010-03-30 NOTE — Progress Notes (Signed)
  Phone Note Refill Request Message from:  Fax from Pharmacy on August 05, 2008 8:42 AM  Refills Requested: Medication #1:  SYNTHROID 50 MCG  TABS qd      Prescriptions: SYNTHROID 50 MCG  TABS (LEVOTHYROXINE SODIUM) qd  #30 x 0   Entered and Authorized by:   Ami Bullins CMA   Signed by:   Ami Bullins CMA on 08/05/2008   Method used:   Faxed to ...       [facility]       [address]       [place], [place]  [address]       Ph: [phone] or [phone]       Fax: [phone]   RxID:   [id number]

## 2010-03-30 NOTE — Assessment & Plan Note (Signed)
Summary: 2 MTH FU-$50-STC   Vital Signs:  Patient Profile:   77 Years Old Male Weight:      170.6 pounds Temp:     97.0 degrees F oral Pulse rate:   76 / minute BP sitting:   124 / 72  (right arm) Cuff size:   regular  Pt. in pain?   no  Vitals Entered By: Orlan Leavens (October 24, 2007 9:28 AM)                  PCP:  d Hayden Rasmussen  Chief Complaint:  2 MONTH FOLLOW-UP.  History of Present Illness: pt says he sometimes takes only 1/2 of his synthroid, due to nausea. takes parlodel as rx'ed. has been noted by dr hochrein to have hypotension.  his diary at home shows systolic to be 90-113.  pt says this has been present x 40 years, but dr hochrein wants to exclude endocrine cause.     Current Allergies: No known allergies   Past Medical History:    Reviewed history from 07/26/2007 and no changes required:       upcoming esophageal surgery       HYPOTHYROIDISM (ICD-244.9)       PITUITARY ADENOMA (ICD-227.3)       HYPERLIPIDEMIA (ICD-272.4)       CORONARY ARTERY DISEASE (ICD-414.00)       COPD (ICD-496)                   Review of Systems  The patient denies syncope.         has slight dizziness   Physical Exam  General:     well developed, well nourished, in no acute distress Msk:     gait is normal Additional Exam:     cortisol increased from 8 to 24, with cosyntropin  tsh, free t4, and prolactin are all normal    Impression & Recommendations:  Problem # 1:  HYPERPROLACTINEMIA (ICD-253.1)  Problem # 2:  OTHER SPECIFIED HYPOTENSION (ICD-458.8) endocrine cause is excluded  Problem # 3:  PITUITARY ADENOMA (ICD-227.3) ? has this resolved with normalization of the prolactin  Other Orders: TLB-TSH (Thyroid Stimulating Hormone) (84443-TSH) TLB-T4 (Thyrox), Free [id number])   Patient Instructions: 1)  i told pt that his nausea is most likely due to parlodel, not synthroid. 2)  please make every effort to take both meds 3)  recheck mri 4)   ret 6 mos   ]  Medication Administration  Injection # 1:    Medication: Cosyntropin Inj.    Diagnosis: OTHER SPECIFIED HYPOTENSION (ICD-458.8)    Route: IM    Site: R deltoid    Exp Date: 04/2009    Lot #: [id number]    Mfr: amphastar    Patient tolerated injection without complications    Given by: Orlan Leavens (October 24, 2007 10:38 AM)  Orders Added: 1)  TLB-Cortisol [82533-CORT] 2)  Cosyntropin Inj. [J0835] 3)  Admin of Therapeutic Inj  intramuscular or subcutaneous [96372] 4)  TLB-BMP (Basic Metabolic Panel-BMET) [80048-METABOL] 5)  TLB-TSH (Thyroid Stimulating Hormone) [84443-TSH] 6)  TLB-T4 (Thyrox), Free [14782-NF6O] 7)  TLB-Prolactin [84146-PROL] 8)  Radiology Referral [Radiology] 9)  Est. Patient Level IV [13086]

## 2010-03-30 NOTE — Consult Note (Signed)
Summary: [facility]  [facility]   Imported By: Briant Cedar 07/18/2007 10:24:28  _____________________________________________________________________  External Attachment:    Type:   Image     Comment:   External Document

## 2010-03-30 NOTE — Progress Notes (Signed)
  Phone Note Refill Request Message from:  Fax from Pharmacy on September 05, 2008 10:11 AM  Initial call taken by: Ami Bullins CMA,  September 05, 2008 10:11 AM  Follow-up for Phone Call        Do you want to refill this pt's Levothyroxine. Please advise Follow-up by: Ami Bullins CMA,  September 05, 2008 10:12 AM  Additional Follow-up for Phone Call Additional follow up Details #1::        refill x 1 please advise pt to make appt Additional Follow-up by: Minus Breeding MD,  September 05, 2008 10:23 AM      Prescriptions: SYNTHROID 50 MCG  TABS (LEVOTHYROXINE SODIUM) qd  #30 x 0   Entered and Authorized by:   Ami Bullins CMA   Signed by:   Ami Bullins CMA on 09/05/2008   Method used:   Faxed to ...       Hospital doctor (retail)       [address]       [place], [place]  [address]       Ph: [phone] or [phone]       Fax: [phone]   RxID:   [id number]

## 2010-03-30 NOTE — Letter (Signed)
Summary: [facility]   Imported By: Esmeralda Links D'jimraou 05/02/2008 15:00:52  _____________________________________________________________________  External Attachment:    Type:   Image     Comment:   External Document

## 2010-03-30 NOTE — Progress Notes (Signed)
  Phone Note Refill Request Message from:  Fax from Pharmacy on October 06, 2008 11:11 AM  Refills Requested: Medication #1:  SYNTHROID 50 MCG  TABS qd Initial call taken by: Ami Bullins CMA,  October 06, 2008 11:11 AM    Prescriptions: SYNTHROID 50 MCG  TABS (LEVOTHYROXINE SODIUM) qd  #30 x 11   Entered and Authorized by:   Ami Bullins CMA   Signed by:   Ami Bullins CMA on 10/06/2008   Method used:   Faxed to ...       Hospital doctor (retail)       [address]       [place], [place]  [address]       Ph: [phone] or [phone]       Fax: [phone]   RxID:   [id number] SYNTHROID 50 MCG  TABS (LEVOTHYROXINE SODIUM) qd  #30 x 11   Entered and Authorized by:   Ami Bullins CMA   Signed by:   Bill Salinas CMA on 10/06/2008   Method used:   Print then Give to Patient   RxID:   [id number]

## 2010-03-30 NOTE — Assessment & Plan Note (Signed)
Summary: FU PER [name]  STC   Vital Signs:  Patient profile:   77 year old male Height:      67 inches (170.18 cm) Weight:      167.25 pounds (76.02 kg) BMI:     26.29 O2 Sat:      94 % on Room air Temp:     97.0 degrees F (36.11 degrees C) oral Pulse rate:   73 / minute BP sitting:   112 / 64  (left arm) Cuff size:   regular  Vitals Entered By: Brenton Grills MA (December 15, 2009 1:50 PM)  O2 Flow:  Room air CC: Follow-up visit/aj Is Patient Diabetic? No   Primary Provider:  Rudi Heap, MD  CC:  Follow-up visit/aj.  History of Present Illness: the status of at least 3 ongoing medical problems is addressed today: pituitary adenoma:  no headache hyperprolactinemia:  he reports intermittent lightheadedness, and assoc nausea. hypothyroidism:  denies weight gain  Current Medications (verified): 1)  Singulair 10 Mg  Tabs (Montelukast Sodium) .... Take 1 By Mouth Once Daily As Needed 2)  Pravachol 40 Mg  Tabs (Pravastatin Sodium) .... Take 1 By Mouth Qd 3)  Nitroglycerin 0.4 Mg  Subl (Nitroglycerin) .... Use Prn 4)  Aspirin 81 Mg  Tbec (Aspirin) .... Take 1 By Mouth Qd 5)  Synthroid 50 Mcg  Tabs (Levothyroxine Sodium) .... Qd 6)  Parlodel 5 Mg  Caps (Bromocriptine Mesylate) .... Qhs 7)  Proventil Hfa 108 (90 Base) Mcg/act Aers (Albuterol Sulfate) .... 2 Puffs Four Times A Day As Needed 8)  Fish Oil 1200 Mg Caps (Omega-3 Fatty Acids) .... Take 1 By Mouth Once Daily 9)  Vitamin D 1000 Unit Tabs (Cholecalciferol) .... Take 2 By Mouth Once Daily 10)  Meclizine Hcl 25 Mg Tabs (Meclizine Hcl) .Marland Kitchen.. 1 Two Times A Day As Needed Vertigo 11)  Docusate Sodium 100 Mg Caps (Docusate Sodium) .... Take 1 By Mouth Once Daily 12)  Pantoprazole Sodium 40 Mg Tbec (Pantoprazole Sodium) .... Take One By Mouth Once Daily 13)  Iron 325 (65 Fe) Mg Tabs (Ferrous Sulfate) .... Take One By Mouth Two Times A Day 14)  Multivitamins   Tabs (Multiple Vitamin) .Marland Kitchen.. 1 By Mouth Daily  Allergies  (verified): 1)  ! Pcn  Past History:  Past Medical History: HYPOTENSION (ICD-458.9) DYSPNEA (ICD-786.05) HYPERLIPIDEMIA (ICD-272.4) CORONARY ARTERY DISEASE (ICD-414.00) BENIGN PROSTATIC HYPERTROPHY, HX OF (ICD-V13.8) DIVERTICULOSIS, MILD (ICD-562.10) OSTEOPOROSIS (ICD-733.00) DEGENERATIVE JOINT DISEASE (ICD-715.90) ESOPHAGITIS (ICD-530.10) ANEMIA (ICD-285.9) COLONIC POLYPS (ICD-211.3) HYPERPROLACTINEMIA (ICD-253.1) OTHER SPECIFIED HYPOTENSION (ICD-458.8) HYPOTHYROIDISM (ICD-244.9) PITUITARY ADENOMA (ICD-227.3) COPD (ICD-496) Bonchiectasis RLL- CT 2009  Social History: Reviewed history from 08/08/2007 and no changes required. married 2 children retiired never smoked no etoh  Physical Exam  General:  normal appearance.   Neck:  Supple without thyroid enlargement or tenderness.  Msk:  gait is normal and steady Additional Exam:  outside test results are reviewed:  mri brain (aug, 2011) Pituitary tumor on the left is stable and is most compatible with a macroadenoma.  It measures 16 x 11 mm.  No new findings.  tft (oct,2011)=normal  today: Prolactin                 10.8 ng/mL     Impression & Recommendations:  Problem # 1:  HYPERPROLACTINEMIA (ICD-253.1) prob due to stalk compression, as the size of the tumpr is out of proportion to the elev prolactin.    Problem # 2:  PITUITARY ADENOMA (ICD-227.3) Assessment: Unchanged  Problem # 3:  HYPOTHYROIDISM (ICD-244.9) well-replaced  Problem # 4:  dizziness and nausea, prob due to parlodel  Medications Added to Medication List This Visit: 1)  Bromocriptine Mesylate 2.5 Mg Tabs (Bromocriptine mesylate) .Marland Kitchen.. 1 tab at bedtime  Other Orders: T-Prolactin (62703-50093) Est. Patient Level IV (81829)  Patient Instructions: 1)  same levothyroxine 2)  Please schedule a follow-up appointment in 1 year. 3)  blood tests are being ordered for you today.  please call [phone] to hear your test results. 4)  (update: i  left message on phone-tree:  reduce parlodel to 2.5 mg at bedtime). Prescriptions: BROMOCRIPTINE MESYLATE 2.5 MG TABS (BROMOCRIPTINE MESYLATE) 1 tab at bedtime  #30 x 11   Entered and Authorized by:   Minus Breeding MD   Signed by:   Minus Breeding MD on 12/16/2009   Method used:   Faxed to ...       Hospital doctor (retail)       [address]       [place], [place]  [address]       Ph: [phone] or [phone]       Fax: [phone]   RxID:   [id number]    Orders Added: 1)  T-Prolactin [45809-98338] 2)  Est. Patient Level IV [25053]

## 2010-03-30 NOTE — Consult Note (Signed)
Summary: [facility] Note/[facility]  [facility]   Imported By: Stephannie Li 09/19/2007 15:58:27  _____________________________________________________________________  External Attachment:    Type:   Image     Comment:   External Document

## 2010-03-30 NOTE — Assessment & Plan Note (Signed)
Summary: surgery clearance/lmr   Visit Type:  Follow-up PCP:  d [name]  Chief Complaint:  surgery clearance.  History of Present Illness: Current Problems:  HYPOTHYROIDISM (ICD-244.9) PITUITARY ADENOMA (ICD-227.3) HYPERLIPIDEMIA (ICD-272.4) CORONARY ARTERY DISEASE (ICD-414.00) COPD (ICD-496)  [name] returns for follow-up of his history of asthma/COPD.  He is a never smoker.  He is being evaluated by Dr. Luretha Murphy for possible surgical repair of a large hiatal hernia.  He had had a left pleural effusion, which resolved.  He had a stent placed by Dr. Antoine Poche in February for coronary disease. Pituitary adenoma is being treated medically by Dr. Everardo All and followed for neurosurgery by Dr. Channing Mutters.  He has left parasternal angina, which is stable and also some sternal esophageal pain.  Sometimes he has difficulty telling these apart.Marland Kitchen  He notices dyspnea on stairs and steps.  In recent weeks he has awakened a few times feeling short of breath and gasping.  His wife says he does not snore much.  He has a tank of oxygen at home that somebody gave him.  Every day he expects to cough a little phlegm.  This did not change much after he was given a Z-Pak.  Pulmonary function testing in July of 2008 showed mild obstruction without response to bronchodilator.  FEV1/FVC was 0.63.  Marland Kitchen  There was hyperinflation and air trapping.  Diffusion was mildly reduced at 79% of predicted.  Chest x-ray on March 12 at [facility] showed COPD with no active disease.  Heart was at the upper limit of normal.  A chest CT scan with contrast on March 18 showed mild bronchiectatic change at the right lung base with some mucus plugging, a moderately large hiatal hernia, atherosclerosis.       Current Allergies (reviewed today): No known allergies   Past Medical History:    Reviewed history from 06/25/2007 and no changes required:       COPD       Coronary artery disease  Hyperlipidemia       upcoming esophageal surgery       Pituitary adenoma  Past Surgical History:    Reviewed history from 06/22/2007 and no changes required:       Inguinal herniorrhaphy       PTCA/stent       LEFT & RIGHT FOOT RECONSTRUCTION     Review of Systems      See HPI   Vital Signs:  Patient Profile:   77 Years Old Male Weight:      178.50 pounds O2 Sat:      97 % O2 treatment:    Room Air Pulse rate:   80 / minute BP sitting:   120 / 72  (left arm) Cuff size:   regular  Vitals Entered By: Reynaldo Minium CMA (Jul 25, 2007 9:36 AM)             Comments Medications reviewed with patient Reynaldo Minium CMA  Jul 25, 2007 9:36 AM      Physical Exam  General:     normal appearance and healthy appearing.   Eyes:     PERRLA/EOM intact; conjunctiva and sclera clear Ears:     TMs intact and clear with normal canals Nose:     no deformity, discharge, inflammation, or lesions Mouth:     no deformity or lesions Neck:     no JVD.   Lungs:     breath sounds are coarser in the right lower  lobe, but without rhonchi, dullness or wheeze. Heart:     regular rate and rhythm, S1, S2 without murmurs, rubs, gallops, or clicks Extremities:     no clubbing, cyanosis, edema, or deformity noted Cervical Nodes:     no significant adenopathy Axillary Nodes:     no significant adenopathy     Problem # 1:  COPD (ICD-496)  Mild exertional dyspnea.  Pulmonary function testing in July of 2008 had shown an FEV1/FVC of 0.63.Marland Kitchen  Chest symptoms include discomforts attributed to angina and to his hernia.Some coarseness of breath sounds in the right base is consistent with bronchiectasis on CT scan without active process.  I will try to update his pulmonary function tests.  Meanwhile, from a pulmonary standpoint,  he is stable for necessary surgery with general anesthesia.  The following medications were removed from the medication list:    Ipratropium-albuterol 0.5-2.5 (3) Mg/44ml  Soln (Ipratropium-albuterol) ..... Use prn    Dexamethasone 1 Mg Tabs (Dexamethasone) .Marland Kitchen... 1 dose at 10 pm the night before blood test  His updated medication list for this problem includes:    Spiriva Handihaler 18 Mcg Caps (Tiotropium bromide monohydrate) ..... Use as directed    Singulair 10 Mg Tabs (Montelukast sodium) .Marland Kitchen... Take 1 by mouth qd    Patient Instructions: 1)  Please schedule a follow-up appointment in 4 months. 2)  Schedule  PFT   ]

## 2010-03-30 NOTE — Progress Notes (Signed)
Summary: REFLUX   Phone Note Call from Patient Call back at Home Phone [phone]   Call For: DR Krisi Azua Reason for Call: Talk to Nurse Summary of Call: HIATEL HERNIA REPAIR 09-12-07. IN MEANTIME HAVING A LOT OF REFLUX AND WONDERS IF THERE IS ANY ADVICE WE CAN OFFER TO HELP UNTIL SURGERY. CHART REQ FROM CF Initial call taken by: Leanor Kail [facility],  August 06, 2007 9:07 AM  Follow-up for Phone Call        alot of regurgitation everytime he eats, and he stopped carafate, btu has since restarted it. and noticed a difference since restarting. went over the reflux diet and will mail them to the pt. Follow-up by: Harlow Mares CMA,  August 06, 2007 9:34 AM

## 2010-03-30 NOTE — Assessment & Plan Note (Signed)
Summary: FU Natale Milch   Vital Signs:  Patient profile:   77 year old male Height:      67 inches (170.18 cm) Weight:      170 pounds (77.27 kg) BMI:     26.72 O2 Sat:      96 % on Room air Temp:     98.6 degrees F (37.00 degrees C) oral Pulse rate:   101 / minute BP sitting:   128 / 70  (left arm) Cuff size:   regular  Vitals Entered By: Brenton Grills MA (September 15, 2009 10:19 AM)  O2 Flow:  Room air CC: F/U appt/pt states he had a fever last night around 101.4/aj   Primary Provider:  Rudi Heap, MD  CC:  F/U appt/pt states he had a fever last night around 101.4/aj.  History of Present Illness: pt states few days of slight prod-quality cough, and now has associated fever x 1 day.  he has seen [place] and [place] office within the past few days, but the fever is new since then. he was found on surveillance to have mrsa. he was in the hospital few weeks ago with uti.  Current Medications (verified): 1)  Singulair 10 Mg  Tabs (Montelukast Sodium) .... Take 1 By Mouth Once Daily As Needed 2)  Plavix 75 Mg  Tabs (Clopidogrel Bisulfate) .Marland Kitchen.. 1 Once Daily 3)  Pravachol 40 Mg  Tabs (Pravastatin Sodium) .... Take 1 By Mouth Qd 4)  Hyoscyamine Sulfate 0.125 Mg  Tabs (Hyoscyamine Sulfate) .... Use Prn 5)  Nitroglycerin 0.4 Mg  Subl (Nitroglycerin) .... Use Prn 6)  Aspirin 81 Mg  Tbec (Aspirin) .... Take 1 By Mouth Qd 7)  Synthroid 50 Mcg  Tabs (Levothyroxine Sodium) .... Qd 8)  Parlodel 5 Mg  Caps (Bromocriptine Mesylate) .... Qhs 9)  Proventil Hfa 108 (90 Base) Mcg/act Aers (Albuterol Sulfate) .... 2 Puffs Four Times A Day As Needed 10)  Fish Oil 1200 Mg Caps (Omega-3 Fatty Acids) .... Take 1 By Mouth Once Daily 11)  Vitamin D 1000 Unit Tabs (Cholecalciferol) .... Take 2 By Mouth Once Daily 12)  Meclizine Hcl 25 Mg Tabs (Meclizine Hcl) .Marland Kitchen.. 1 Two Times A Day As Needed Vertigo 13)  Docusate Sodium 100 Mg Caps (Docusate Sodium) .... Take 1 By Mouth Once Daily 14)  Pantoprazole Sodium 40  Mg Tbec (Pantoprazole Sodium) .... Take 1 Tablet Qd  Allergies (verified): 1)  ! Pcn  Past History:  Past Medical History: Last updated: 07/17/2008 HYPOTENSION (ICD-458.9) DYSPNEA (ICD-786.05) HYPERLIPIDEMIA (ICD-272.4) CORONARY ARTERY DISEASE (ICD-414.00) BENIGN PROSTATIC HYPERTROPHY, HX OF (ICD-V13.8) DIVERTICULOSIS, MILD (ICD-562.10) OSTEOPOROSIS (ICD-733.00) DEGENERATIVE JOINT DISEASE (ICD-715.90) ESOPHAGITIS (ICD-530.10) ANEMIA (ICD-285.9) COLONIC POLYPS (ICD-211.3) HYPERPROLACTINEMIA (ICD-253.1) OTHER SPECIFIED HYPOTENSION (ICD-458.8) HYPOTHYROIDISM (ICD-244.9) PITUITARY ADENOMA (ICD-227.3) COPD (ICD-496) Bonchiectasis RLL- CT 2009  Review of Systems  The patient denies syncope, dyspnea on exertion, abdominal pain, melena, hematochezia, severe indigestion/heartburn, and hematuria.         he had n/v x 2 yesterday.  denies earache.  Physical Exam  General:  no distress  Head:  head: no deformity eyes: no periorbital swelling, no proptosis external nose and ears are normal mouth: no lesion seen Ears:  TM's intact and clear with normal canals with grossly normal hearing.   Neck:  supple Lungs:  Clear to auscultation bilaterally. Normal respiratory effort.  Heart:  Regular rate and rhythm without murmurs or gallops noted. Normal S1,S2.   Abdomen:  abdomen is soft, nontender.  no hepatosplenomegaly.   not  distended.  no hernia  Msk:  muscle bulk and strength are grossly normal.  no obvious joint swelling.  gait is unsteady  Extremities:  1+ right pedal edema and 1+ left pedal edema.   Neurologic:  cn 2-12 grossly intact.   readily moves all 4's.   sensation is intact to touch on all 4's Skin:  normal texture and temp.  no rash.  not diaphoretic  Cervical Nodes:  No significant adenopathy.  Psych:  Alert and cooperative; normal mood and affect; normal attention span and concentration.     Impression & Recommendations:  Problem # 1:  FEVER UNSPECIFIED  (ICD-780.60) Assessment New uncertain etiology  Problem # 2:  cough ? related to #1  Problem # 3:  h/o mrsa pt says noted on surveillance 1 month ago  Problem # 4:  CORONARY ARTERY DISEASE (ICD-414.00) no evidence this is active now  Medications Added to Medication List This Visit: 1)  Singulair 10 Mg Tabs (Montelukast sodium) .... Take 1 by mouth once daily as needed  Other Orders: Est. Patient Level IV (81191)  Patient Instructions: 1)  admit telemetry 2)  labs, including blood cultures 3)  avelox for now. 4)  we discussed code status.  pt requests full code, but would not want to be started or maintained on artificial life-support measures if there was not a reasonable chance of recovery.

## 2010-03-30 NOTE — Assessment & Plan Note (Signed)
Summary: NEW ENDO CONSULT/DR ROY/MEDICARE AARP /PROLACTINOMA/NWS   Vital Signs:  Patient Profile:   77 Years Old Male Weight:      178.0 pounds Temp:     97.1 degrees F oral Pulse rate:   88 / minute BP sitting:   118 / 65  (right arm) Cuff size:   regular  Vitals Entered By: Orlan Leavens (June 25, 2007 9:26 AM)                 PCP:  d Hayden Rasmussen  Chief Complaint:  NEW ENDO/REFERRED BY DR ROY/ PROLACTINOMA.  History of Present Illness: was recently eval for neck pain, with mri of c-spine.  was incidentally noted to have pituitary mass.  feels well except for few years of mild intermittent dizziness, with no assoc sxs. was also noted by dr Christell Constant to have elev tsh    Current Allergies: No known allergies   Past Medical History:    Reviewed history from 06/22/2007 and no changes required:       COPD       Coronary artery disease       Hyperlipidemia       upcoming esophageal surgery   Family History:    Reviewed history and no changes required:       neg for pituitary dz  Social History:    Reviewed history and no changes required:       married       retiired    Review of Systems  The patient denies syncope, unusual weight change, vision loss, decreased hearing, and fever.         denies headache, gynecomastia, galactorrhea, polyuria, erectile dysfunction   Physical Exam  General:     well developed, well nourished, in no acute distress Head:     normocephalic  Eyes:     no gross abnormality of the eyes.  no periorbital swelling, and no scleral icterus  Ears:     external ears are normal  Nose:     no gross abnormality of the nose  Neck:     no masses, thyromegaly, or abnormal cervical nodes Breasts:     no gynecomastia.  no galactorrhea Lungs:     clear to auscultation.  no respiratory distress  Heart:     regular rate and rhythm, S1, S2 without murmurs, rubs, gallops, or clicks Abdomen:     abdomen soft and non-tender without  masses, organomegaly, or hernias noted  no striae. Genitalia:     testes slightly small and soft Msk:     no acromegalic features Neurologic:     no focal deficits, CN II-XII grossly intact Skin:     normal hair distribution Psych:     alert and cooperative; normal mood and affect; normal attention span and concentration Additional Exam:     Perform Date: 16Apr09 MRI HEAD WITHOUT AND WITH CONTRAST   1.  Inferiorly-directed sellar mass appears be based within the   pituitary.  This measures maximally 11.3 mm.  It is most compatible   with a pituitary microadenoma.   2.  Mild generalized atrophy, likely within normal limits for age  fsh=2.6 lh=3.1 tsh=5.3 prolactin=139.7    Impression & Recommendations:  Problem # 1:  PITUITARY ADENOMA (ICD-227.3) (prolactin-producing) His updated medication list for this problem includes:    Dexamethasone 1 Mg Tabs (Dexamethasone) .Marland Kitchen... 1 dose at 10 pm the night before blood test    Synthroid 50 Mcg Tabs (Levothyroxine sodium) ..... Qd  Orders: Consultation Level IV (57846)   Problem # 2:  dizziness, very unlikely related to #1  Problem # 3:  HYPOTHYROIDISM (ICD-244.9) mild--this is not abnormal enough to significantly affect #1 His updated medication list for this problem includes:    Synthroid 50 Mcg Tabs (Levothyroxine sodium) ..... Qd   Medications Added to Medication List This Visit: 1)  Nitroglycerin 0.4 Mg Subl (Nitroglycerin) .... Use prn 2)  Carafate 1 Gm/9ml Susp (Sucralfate) .... Take 2 teaspoon qid 3)  Aspirin 81 Mg Tbec (Aspirin) .... Take 1 by mouth qd 4)  Dexamethasone 1 Mg Tabs (Dexamethasone) .Marland Kitchen.. 1 dose at 10 pm the night before blood test 5)  Synthroid 50 Mcg Tabs (Levothyroxine sodium) .... Qd 6)  Parlodel 2.5 Mg Tabs (Bromocriptine mesylate) .... Qhs   Patient Instructions: 1)  synthroid 50/d 2)  parlodel 2.5 qhs 3)  overnight dex test and other labs, upon ret 30d 4)  no contraindication to upcoming  esophageal surgery 5)  cc drs roy, moore, patterson, martin    Prescriptions: SYNTHROID 50 MCG  TABS (LEVOTHYROXINE SODIUM) qd  #30 x 11   Entered and Authorized by:   Minus Breeding MD   Signed by:   Minus Breeding MD on 06/25/2007   Method used:   Print then Give to Patient   RxID:   [id number] PARLODEL 2.5 MG  TABS (BROMOCRIPTINE MESYLATE) qhs  #30 x 11   Entered and Authorized by:   Minus Breeding MD   Signed by:   Minus Breeding MD on 06/25/2007   Method used:   Print then Give to Patient   RxID:   [id number] DEXAMETHASONE 1 MG  TABS (DEXAMETHASONE) 1 dose at 10 pm the night before blood test  #1 x 0   Entered and Authorized by:   Minus Breeding MD   Signed by:   Minus Breeding MD on 06/25/2007   Method used:   Print then Give to Patient   RxID:   [id number]  ]  Appended Document: NEW ENDO CONSULT/DR ROY/MEDICARE AARP /PROLACTINOMA/NWS FAXED NOTES TO DR ROY @ [id number], DR MOORE @ [phone], DR PATTERSON @ [phone]/LMB

## 2010-03-30 NOTE — Progress Notes (Signed)
  Phone Note Refill Request Message from:  Fax from Pharmacy on March 03, 2009 9:35 AM  Refills Requested: Medication #1:  PARLODEL 5 MG  CAPS qhs   Dosage confirmed as above?Dosage Confirmed Initial call taken by: Josph Macho CMA,  March 03, 2009 9:35 AM    Prescriptions: PARLODEL 5 MG  CAPS (BROMOCRIPTINE MESYLATE) qhs  #30 x 4   Entered by:   Josph Macho CMA   Authorized by:   Minus Breeding MD   Signed by:   Josph Macho CMA on 03/03/2009   Method used:   Faxed to ...       [facility]       [address]       [place], [place]  [address]       Ph: [phone] or [phone]       Fax: [phone]   RxID:   [id number]

## 2010-03-30 NOTE — Assessment & Plan Note (Signed)
Summary: hosp f/u bleeding esophageal ulcer...em    History of Present Illness Visit Type: Follow-up Visit Primary GI MD: Sheryn Bison MD FACP FAGA Primary Provider: Rudi Heap, MD Chief Complaint: Post hospital esophageal bleed, patient feeling better and has not  noticed any blood History of Present Illness:   Patient and acute upper GI bleed from esophageal ulceration. He had an endoscopic clipping of a visible vessel by Dr. Arlyce Dice. Followup endoscopy showed uneventful healing of his ulceration. He had a massive upper GI bleed associated with aspirin Plavix use. It was felt that his esophageal ulceration was related to Actonel which he uses weekly for the last several years. He is status post fundoplication surgery a year ago for chronic GERD. He has had no acid reflux symptoms or dysphagia before his recent hospitalization.  He currently is fairly asymptomatic and denies any cardiovascular, pulmonary, or gastrointestinal symptoms. He's been off of Plavix for several weeks and is using Protonix 40 mg twice a day. He does take aspirin 81 mg a day. Since his hospitalization, he has had mild constipation which is relieved with MiraLax. He is on oral iron replacement therapy.   GI Review of Systems    Reports bloating.      Denies abdominal pain, acid reflux, belching, chest pain, dysphagia with liquids, dysphagia with solids, heartburn, loss of appetite, nausea, vomiting, vomiting blood, weight loss, and  weight gain.      Reports change in bowel habits and  constipation.     Denies anal fissure, black tarry stools, diarrhea, diverticulosis, fecal incontinence, heme positive stool, hemorrhoids, irritable bowel syndrome, jaundice, light color stool, liver problems, rectal bleeding, and  rectal pain.    Current Medications (verified): 1)  Singulair 10 Mg  Tabs (Montelukast Sodium) .... Take 1 By Mouth Qd 2)  Plavix 75 Mg  Tabs (Clopidogrel Bisulfate) .... Hold 3)  Pravachol 40 Mg  Tabs  (Pravastatin Sodium) .... Take 1 By Mouth Qd 4)  Hyoscyamine Sulfate 0.125 Mg  Tabs (Hyoscyamine Sulfate) .... Use Prn 5)  Nitroglycerin 0.4 Mg  Subl (Nitroglycerin) .... Use Prn 6)  Aspirin 81 Mg  Tbec (Aspirin) .... Take 1 By Mouth Qd 7)  Synthroid 50 Mcg  Tabs (Levothyroxine Sodium) .... Qd 8)  Parlodel 5 Mg  Caps (Bromocriptine Mesylate) .... Qhs 9)  Proventil Hfa 108 (90 Base) Mcg/act Aers (Albuterol Sulfate) .... 2 Puffs Four Times A Day As Needed 10)  Fish Oil 1200 Mg Caps (Omega-3 Fatty Acids) .... Take 1 By Mouth Once Daily 11)  Vitamin D 1000 Unit Tabs (Cholecalciferol) .... Take 2 By Mouth Once Daily 12)  Meclizine Hcl 25 Mg Tabs (Meclizine Hcl) .Marland Kitchen.. 1 Two Times A Day As Needed Vertigo 13)  Docusate Sodium 100 Mg Caps (Docusate Sodium) .... Take 1 By Mouth Once Daily 14)  Pantoprazole Sodium 40 Mg Tbec (Pantoprazole Sodium) .... Take 1 Tablet By Mouth Two Times A Day  Allergies (verified): 1)  ! Pcn  Past History:  Past medical, surgical, family and social histories (including risk factors) reviewed for relevance to current acute and chronic problems.  Past Medical History: Reviewed history from 07/17/2008 and no changes required. HYPOTENSION (ICD-458.9) DYSPNEA (ICD-786.05) HYPERLIPIDEMIA (ICD-272.4) CORONARY ARTERY DISEASE (ICD-414.00) BENIGN PROSTATIC HYPERTROPHY, HX OF (ICD-V13.8) DIVERTICULOSIS, MILD (ICD-562.10) OSTEOPOROSIS (ICD-733.00) DEGENERATIVE JOINT DISEASE (ICD-715.90) ESOPHAGITIS (ICD-530.10) ANEMIA (ICD-285.9) COLONIC POLYPS (ICD-211.3) HYPERPROLACTINEMIA (ICD-253.1) OTHER SPECIFIED HYPOTENSION (ICD-458.8) HYPOTHYROIDISM (ICD-244.9) PITUITARY ADENOMA (ICD-227.3) COPD (ICD-496) Bonchiectasis RLL- CT 2009  Past Surgical History: Reviewed history from 05/19/2008 and  no changes required. Inguinal herniorrhaphy PTCA/stent LEFT & RIGHT FOOT RECONSTRUCTION hiatal hernia repair 2009  Family History: Reviewed history from 08/08/2007 and no changes  required. neg for pituitary dz mother died age 15 from phlebitis father died age 31 from heart failure 8  siblings  alive age 12,80,89,92 1 died age 7 heart failure 3 died age 42,76,90 from heart problems  Social History: Reviewed history from 08/08/2007 and no changes required. married 2 children retiired never smoked no etoh  Review of Systems       The patient complains of arthritis/joint pain, swelling of feet/legs, and urination changes/pain.  The patient denies allergy/sinus, anemia, anxiety-new, back pain, blood in urine, breast changes/lumps, change in vision, confusion, cough, coughing up blood, depression-new, fainting, fatigue, fever, headaches-new, hearing problems, heart murmur, heart rhythm changes, itching, menstrual pain, muscle pains/cramps, night sweats, nosebleeds, pregnancy symptoms, shortness of breath, skin rash, sleeping problems, sore throat, swollen lymph glands, thirst - excessive , urination - excessive , urine leakage, vision changes, and voice change.    Vital Signs:  Patient profile:   77 year old male Height:      67 inches Weight:      166.25 pounds BMI:     26.13 Pulse rate:   64 / minute Pulse rhythm:   regular BP sitting:   104 / 70  (left arm) Cuff size:   regular  Vitals Entered By: June McMurray CMA Duncan Dull) (August 25, 2009 3:36 PM)  Physical Exam  General:  Well developed, well nourished, no acute distress.healthy appearing.   Head:  Normocephalic and atraumatic. Eyes:  PERRLA, no icterus.exam deferred to patient's ophthalmologist.   Lungs:  Clear throughout to auscultation. Heart:  Regular rate and rhythm; no murmurs, rubs,  or bruits.irregular rhythm: and heart murmur systolic:.   Abdomen:  Soft, nontender and nondistended. No masses, hepatosplenomegaly or hernias noted. Normal bowel sounds. Extremities:  No clubbing, cyanosis, edema or deformities noted. Neurologic:  Alert and  oriented x4;  grossly normal neurologically. Psych:   Alert and cooperative. Normal mood and affect.   Impression & Recommendations:  Problem # 1:  BLOOD IN STOOL-MELENA (ICD-578.1) Assessment Improved I agree that Actonel most likely causes esophageal ulceration, and we will continue to hold this medication. He is to continue on Protonix 40 mg a day for another month, CBC and anemia profile ordered today. His hemoglobin is stable, I will restart his Plavix therapy per his previous cardiac stenting. He has no symptoms of hypovolemia or active GI bleeding at this time. As mentioned previously, is status post fundoplication surgery per Dr. Luretha Murphy. He has no acid reflux or dysphagia problems at this time.  Problem # 2:  ESOPHAGEAL REFLUX (ICD-530.81) Assessment: Improved Continue Protonix 40 mg a day for another month empirically. Orders: TLB-CBC Platelet - w/Differential (85025-CBCD) TLB-B12, Serum-Total ONLY (45409-W11) TLB-Ferritin (82728-FER) TLB-Folic Acid (Folate) (82746-FOL) TLB-IBC Pnl (Iron/FE;Transferrin) (83550-IBC)  Problem # 3:  CORONARY ARTERY DISEASE (ICD-414.00) Assessment: Unchanged Continue multiple cardiac medications per Dr. Rudi Heap.  Problem # 4:  DIVERTICULOSIS, MILD (ICD-562.10) Assessment: Comment Only  Problem # 5:  ANEMIA (ICD-285.9) Assessment: Improved Continue iron therapy with adjustments as per his clinical course. TLB-CBC Platelet - w/Differential (85025-CBCD) TLB-B12, Serum-Total ONLY (91478-G95) TLB-Ferritin (82728-FER) TLB-Folic Acid (Folate) (82746-FOL) TLB-IBC Pnl (Iron/FE;Transferrin) (83550-IBC)  Patient Instructions: 1)  Please go to the basement for lab work. 2)  Please continue current medications.  3)  The medication list was reviewed and reconciled.  All changed / newly prescribed  medications were explained.  A complete medication list was provided to the patient / caregiver. 4)  Copy sent to : Dr. Rudi Heap 5)  Please continue current medications.

## 2010-03-30 NOTE — Assessment & Plan Note (Signed)
Summary: follow up/ mbw   PCP:  d Hayden Rasmussen  Chief Complaint:  follow up visit .  History of Present Illness: 77 year old man returning for follow-up of COPD.  Complicating problems of coronary disease and pituitary adenoma.  3/09-CT bronchiectasis right lower lobe. Hospitalized in July for hypotension and medications were changed.  Had to put off hiatal hernia surgery, now scheduled for November 6. often wakes at night with phlegm in his throat.  Mucus is usually gray, sometimes a tiny speck of blood.  Notices a band like discomfort around the lower rib cage or upper abdomen.  MRI of the lower spine is pending.     Prior Medications Reviewed Using: List Brought by Patient  Updated Prior Medication List: NEXIUM 40 MG CPDR (ESOMEPRAZOLE MAGNESIUM) Take 1 capsule by mouth twice a day SPIRIVA HANDIHALER 18 MCG  CAPS (TIOTROPIUM BROMIDE MONOHYDRATE) USE AS DIRECTED SINGULAIR 10 MG  TABS (MONTELUKAST SODIUM) TAKE 1 by mouth QD PLAVIX 75 MG  TABS (CLOPIDOGREL BISULFATE) TAKE 1 by mouth QD PRAVACHOL 40 MG  TABS (PRAVASTATIN SODIUM) TAKE 1 by mouth QD ACTONEL 150 MG  TABS (RISEDRONATE SODIUM) TAKE 1 by mouth Q MONTH HYOSCYAMINE SULFATE 0.125 MG  TABS (HYOSCYAMINE SULFATE) USE PRN NITROGLYCERIN 0.4 MG  SUBL (NITROGLYCERIN) USE PRN ASPIRIN 81 MG  TBEC (ASPIRIN) TAKE 1 by mouth QD SYNTHROID 50 MCG  TABS (LEVOTHYROXINE SODIUM) qd PARLODEL 5 MG  CAPS (BROMOCRIPTINE MESYLATE) qhs OMEPRAZOLE 20 MG  CPDR (OMEPRAZOLE) one by mouth two times a day  Current Allergies (reviewed today): No known allergies   Past Medical History:    Reviewed history from 07/26/2007 and no changes required:       upcoming esophageal surgery- hiatal hernia 2009       HYPOTHYROIDISM (ICD-244.9a       PITUITARY ADENOMA (ICD-227.3)       HYPERLIPIDEMIA (ICD-272.4)       CORONARY ARTERY DISEASE (ICD-414.00)       COPD (ICD-496)       Bonchiectasis RLL- CT 2009                Past Surgical History:  Reviewed history from 06/22/2007 and no changes required:       Inguinal herniorrhaphy       PTCA/stent       LEFT & RIGHT FOOT RECONSTRUCTION   Social History:    Reviewed history from 08/08/2007 and no changes required:       married       2 children       retiired       never smoked       no etoh    Review of Systems      See HPI   Vital Signs:  Patient Profile:   77 Years Old Male Weight:      170 pounds O2 Sat:      98 % O2 treatment:    Room Air Pulse rate:   85 / minute BP sitting:   100 / 60  (left arm) Cuff size:   regular  Vitals Entered By: [name]             Comments Medications reviewed with patient [name])  November 22, 2007 9:21 AM      Physical Exam  Mouth:     white exudate.   Additional Exam:     General: A/Ox3; pleasant and cooperative, NAD, SKIN: no rash, lesions NODES: no lymphadenopathy HEENT: [place]/AT, EOM- WNL, Conjuctivae-  clear, PERRLA, TM-WNL, Nose- clear, Throat- clear and wnl NECK: Supple w/ fair ROM, JVD- none, normal carotid impulses w/o bruits Thyroid- normal to palpation CHEST: Clear to P&A, very clear, talkative HEART: RRR, no m/g/r heard ABDOMEN: Soft and nl; nml bowel sounds; no organomegaly or masses noted AVW:UJWJ, nl pulses, no edema  NEURO: Grossly intact to observation         Problem # 1:  COPD (ICD-496) COPD with bronchiectasis, currently stable. Continue reflux precautions. His updated medication list for this problem includes:    Spiriva Handihaler 18 Mcg Caps (Tiotropium bromide monohydrate) ..... Use as directed    Singulair 10 Mg Tabs (Montelukast sodium) .Marland Kitchen... Take 1 by mouth qd    Proventil Hfa 108 (90 Base) Mcg/act Aers (Albuterol sulfate) .Marland Kitchen... 2 puffs four times a day as needed   Medications Added to Medication List This Visit: 1)  Proventil Hfa 108 (90 Base) Mcg/act Aers (Albuterol sulfate) .... 2 puffs four times a day as needed  Complete Medication List: 1)   Nexium 40 Mg Cpdr (Esomeprazole magnesium) .... Take 1 capsule by mouth twice a day 2)  Spiriva Handihaler 18 Mcg Caps (Tiotropium bromide monohydrate) .... Use as directed 3)  Singulair 10 Mg Tabs (Montelukast sodium) .... Take 1 by mouth qd 4)  Plavix 75 Mg Tabs (Clopidogrel bisulfate) .... Take 1 by mouth qd 5)  Pravachol 40 Mg Tabs (Pravastatin sodium) .... Take 1 by mouth qd 6)  Actonel 150 Mg Tabs (Risedronate sodium) .... Take 1 by mouth q month 7)  Hyoscyamine Sulfate 0.125 Mg Tabs (Hyoscyamine sulfate) .... Use prn 8)  Nitroglycerin 0.4 Mg Subl (Nitroglycerin) .... Use prn 9)  Aspirin 81 Mg Tbec (Aspirin) .... Take 1 by mouth qd 10)  Synthroid 50 Mcg Tabs (Levothyroxine sodium) .... Qd 11)  Parlodel 5 Mg Caps (Bromocriptine mesylate) .... Qhs 12)  Omeprazole 20 Mg Cpdr (Omeprazole) .... One by mouth two times a day 13)  Proventil Hfa 108 (90 Base) Mcg/act Aers (Albuterol sulfate) .... 2 puffs four times a day as needed   Patient Instructions: 1)  Please schedule a follow-up appointment in 6 months. 2)  Flu vax   ]   Flu Vaccine Consent Questions     Do you have a history of severe allergic reactions to this vaccine? no    Any prior history of allergic reactions to egg and/or gelatin? no    Do you have a sensitivity to the preservative Thimersol? no    Do you have a past history of Guillan-Barre Syndrome? no    Do you currently have an acute febrile illness? no    Have you ever had a severe reaction to latex? no    Vaccine information given and explained to patient? yes    Are you currently pregnant? no    Lot Number:[id number]   Site Given  Left Deltoid IM   Given by Sheral Flow CMA  November 22, 2007 10:05 AM

## 2010-03-30 NOTE — Procedures (Signed)
Summary: Upper Endoscopy  Patient: [name] Note: All result statuses are Final unless otherwise noted.  Tests: (1) Upper Endoscopy (EGD)   EGD Upper Endoscopy       DONE     [facility]     [address]     [place], [place]  [address]           ENDOSCOPY PROCEDURE REPORT           PATIENT:  [name], [name]  MR#:  [id number]     BIRTHDATE:  02/21/1933, 76 yrs. old  GENDER:  male           ENDOSCOPIST:  Barbette Hair. Arlyce Dice, MD     Referred by:           PROCEDURE DATE:  08/09/2009     PROCEDURE:  EGD for control of bleeding     ASA CLASS:  Class IV     INDICATIONS:  melena           MEDICATIONS:   Fentanyl 25 mcg IV, Versed 3.5 mg IV     TOPICAL ANESTHETIC:  Cetacaine Spray           DESCRIPTION OF PROCEDURE:   After the risks benefits and     alternatives of the procedure were thoroughly explained, informed     consent was obtained.  The  endoscope was introduced through the     mouth and advanced to the third portion of the duodenum, without     limitations.  The instrument was slowly withdrawn as the mucosa     was fully examined.     <<PROCEDUREIMAGES>>           An ulcer was found in the distal esophagus. Ulcer in area of     previous erosion just proximal to GE junction with actively     bleeding visible vessel.     Vessel was initially clipped with 2 endoclips which subsequently     fell off. 10cc 1:10,000 epinephrine was injected into base of     ulcer and surrounding area with hemostasis achieved. 2 clips were     then placed on ulcer (see image1, image4, and image5).  A     stricture was found at the gastroesophageal junction.  Blood was     found in the body and the antrum of the stomach. Large amount of     old blood and clot pooled along greater curvature.    Retroflexed     views revealed no abnormalities.    The scope was then withdrawn     from the patient and the procedure completed.           COMPLICATIONS:  None        ENDOSCOPIC IMPRESSION:Active bleeding from visible vessel, distal     esophageal ulcer, successfully treated with epinephrine and     endoclips     Esophageal     stricture           RECOMMENDATIONS:PPI drip           REPEAT EXAM:  No           ______________________________     Barbette Hair. Arlyce Dice, MD           CC:  Rollene Rotunda, MD           n.     Rosalie Doctor:   Barbette Hair. Eliseo Withers at 08/09/2009 10:26 PM  [place], [place], [phone]  Note: An exclamation mark (!) indicates a result that was not dispersed into the flowsheet. Document Creation Date: 08/09/2009 10:28 PM _______________________________________________________________________  (1) Order result status: Final Collection or observation date-time: 08/09/2009 22:12 Requested date-time:  Receipt date-time:  Reported date-time:  Referring Physician:   Ordering Physician: Melvia Heaps [phone]) Specimen Source:  Source: Launa Grill Order Number: [id number] Lab site:

## 2010-03-30 NOTE — Assessment & Plan Note (Signed)
Summary: PER PT 1 MTH FU-$50-STC   Vital Signs:  Patient Profile:   77 Years Old Male Weight:      176.8 pounds Temp:     97.2 degrees F oral Pulse rate:   83 / minute BP sitting:   104 / 70  (left arm) Cuff size:   regular  Vitals Entered By: Orlan Leavens (Jul 26, 2007 8:02 AM)                 PCP:  d Hayden Rasmussen  Chief Complaint:  1 month follow-up.  History of Present Illness: feels no different on synthroid 50/d on parlodel 2.5/d, has no dizziness or vomiting     Current Allergies: No known allergies   Past Medical History:    Reviewed history from 06/25/2007 and no changes required:       upcoming esophageal surgery       HYPOTHYROIDISM (ICD-244.9)       PITUITARY ADENOMA (ICD-227.3)       HYPERLIPIDEMIA (ICD-272.4)       CORONARY ARTERY DISEASE (ICD-414.00)       COPD (ICD-496)                   Review of Systems  The patient denies syncope.     Physical Exam  General:     well developed, well nourished, in no acute distress Neck:     no masses, thyromegaly, or abnormal cervical nodes Msk:     no deformity or scoliosis noted with normal posture and gait Additional Exam:     PROLACTIN                 35.3 ng/mL CORTISOL                  1.5 ug/dL FastTSH                   1.38 uIU/mL         Impression & Recommendations:  Problem # 1:  HYPOTHYROIDISM (ICD-244.9)  His updated medication list for this problem includes:    Synthroid 50 Mcg Tabs (Levothyroxine sodium) ..... Qd  Orders: TLB-TSH (Thyroid Stimulating Hormone) (84443-TSH) Est. Patient Level III (91478)   Problem # 2:  PITUITARY ADENOMA (ICD-227.3) probably prolactin-producing The following medications were removed from the medication list:    Dexamethasone 1 Mg Tabs (Dexamethasone) .Marland Kitchen... 1 tablet, at 10 pm, the night before blood test  His updated medication list for this problem includes:    Synthroid 50 Mcg Tabs (Levothyroxine sodium) .....  Qd  Orders: TLB-Cortisol (82533-CORT) TLB-Prolactin (84146-PROL)   Medications Added to Medication List This Visit: 1)  Dexamethasone 1 Mg Tabs (Dexamethasone) .Marland Kitchen.. 1 tablet, at 10 pm, the night before blood test 2)  Parlodel 5 Mg Caps (Bromocriptine mesylate) .... Qhs   Patient Instructions: 1)  this does not contraindicate esophageal surgery 2)  cc dr m Daphine Deutscher 3)  increase parlodel to 5/d 4)  ret 3 mos   Prescriptions: DEXAMETHASONE 1 MG  TABS (DEXAMETHASONE) 1 tablet, at 10 PM, the night before blood test  #1 x 0   Entered and Authorized by:   Minus Breeding MD   Signed by:   Minus Breeding MD on 07/26/2007   Method used:   Print then Give to Patient   RxID:   [id number]  ]  Appended Document: PER PT 1 MTH FU-$50-STC        Current Allergies: No known allergies  Family History:    neg for pituitary dz    mother died age 51 from phlebitis    father died age 79 from heart failure    8  siblings     alive age 71,80,89,92    1 died age 26 heart failure    3 died age 33,76,90 from heart problems  Social History:    married    2 children    retiired    never smoked    no etoh         ]

## 2010-03-30 NOTE — Assessment & Plan Note (Signed)
Summary: HOSP FU/YF    History of Present Illness Visit Type: Follow-up Visit Primary GI MD: Sheryn Bison MD FACP FAGA Primary Provider: Rudi Heap, MD Chief Complaint: Patient here to follow up after GI bleed, post EGD. Pt states that he is feeling better but still passing hard stools and denies any other GI complaints  History of Present Illness:   This Patient Was Rehospitalized for an exacerbation of his COPD. After inhaler session, he had nausea and vomiting and hematemesis and had an endoscopic confirmed Mallory-Weiss tear that required Endo Clip performance by Dr. Christella Hartigan. He currently is off Plavix for his cardiovascular issues and is on twice a day pantoprazole. He is 2 years status post fundoplication. Recent hemoglobin was 10.8 and Dr. Dorinda Hill  Moore's office, and the patient is on oral iron therapy and is to start aspirin in mid-October. He currently denies reflux symptoms, dysphagia, abdominal pain, melena, or any cardiopulmonary symptoms.   GI Review of Systems      Denies abdominal pain, acid reflux, belching, bloating, chest pain, dysphagia with liquids, dysphagia with solids, heartburn, loss of appetite, nausea, vomiting, vomiting blood, weight loss, and  weight gain.        Denies anal fissure, black tarry stools, change in bowel habit, constipation, diarrhea, diverticulosis, fecal incontinence, heme positive stool, hemorrhoids, irritable bowel syndrome, jaundice, light color stool, liver problems, rectal bleeding, and  rectal pain.    Current Medications (verified): 1)  Singulair 10 Mg  Tabs (Montelukast Sodium) .... Take 1 By Mouth Once Daily As Needed 2)  Pravachol 40 Mg  Tabs (Pravastatin Sodium) .... Take 1 By Mouth Qd 3)  Nitroglycerin 0.4 Mg  Subl (Nitroglycerin) .... Use Prn 4)  Aspirin 81 Mg  Tbec (Aspirin) .... Take 1 By Mouth Qd 5)  Synthroid 50 Mcg  Tabs (Levothyroxine Sodium) .... Qd 6)  Parlodel 5 Mg  Caps (Bromocriptine Mesylate) .... Qhs 7)  Proventil  Hfa 108 (90 Base) Mcg/act Aers (Albuterol Sulfate) .... 2 Puffs Four Times A Day As Needed 8)  Fish Oil 1200 Mg Caps (Omega-3 Fatty Acids) .... Take 1 By Mouth Once Daily 9)  Vitamin D 1000 Unit Tabs (Cholecalciferol) .... Take 2 By Mouth Once Daily 10)  Meclizine Hcl 25 Mg Tabs (Meclizine Hcl) .Marland Kitchen.. 1 Two Times A Day As Needed Vertigo 11)  Docusate Sodium 100 Mg Caps (Docusate Sodium) .... Take 1 By Mouth Once Daily 12)  Pantoprazole Sodium 40 Mg Tbec (Pantoprazole Sodium) .... Take 1 Tablet Two Times A Day 13)  Iron 325 (65 Fe) Mg Tabs (Ferrous Sulfate) .... Take One By Mouth Two Times A Day 14)  Multivitamins   Tabs (Multiple Vitamin) .Marland Kitchen.. 1 By Mouth Daily  Allergies (verified): 1)  ! Pcn  Past History:  Past medical, surgical, family and social histories (including risk factors) reviewed for relevance to current acute and chronic problems.  Past Medical History: Reviewed history from 07/17/2008 and no changes required. HYPOTENSION (ICD-458.9) DYSPNEA (ICD-786.05) HYPERLIPIDEMIA (ICD-272.4) CORONARY ARTERY DISEASE (ICD-414.00) BENIGN PROSTATIC HYPERTROPHY, HX OF (ICD-V13.8) DIVERTICULOSIS, MILD (ICD-562.10) OSTEOPOROSIS (ICD-733.00) DEGENERATIVE JOINT DISEASE (ICD-715.90) ESOPHAGITIS (ICD-530.10) ANEMIA (ICD-285.9) COLONIC POLYPS (ICD-211.3) HYPERPROLACTINEMIA (ICD-253.1) OTHER SPECIFIED HYPOTENSION (ICD-458.8) HYPOTHYROIDISM (ICD-244.9) PITUITARY ADENOMA (ICD-227.3) COPD (ICD-496) Bonchiectasis RLL- CT 2009  Past Surgical History: Reviewed history from 10/07/2009 and no changes required. Inguinal herniorrhaphy PTCA/stent LEFT & RIGHT FOOT RECONSTRUCTION Hiatal hernia repair 2009  Family History: Reviewed history from 08/08/2007 and no changes required. neg for pituitary dz mother died age 44 from phlebitis father  died age 29 from heart failure 8  siblings  alive age 65,80,89,92 1 died age 63 heart failure 3 died age 68,76,90 from heart problems  Social  History: Reviewed history from 08/08/2007 and no changes required. married 2 children retiired never smoked no etoh  Review of Systems       The patient complains of arthritis/joint pain, cough, fatigue, muscle pains/cramps, and voice change.  The patient denies allergy/sinus, anemia, anxiety-new, back pain, blood in urine, breast changes/lumps, change in vision, confusion, coughing up blood, depression-new, fainting, fever, headaches-new, hearing problems, heart murmur, heart rhythm changes, itching, night sweats, nosebleeds, shortness of breath, skin rash, sleeping problems, sore throat, swelling of feet/legs, swollen lymph glands, thirst - excessive, urination - excessive, urination changes/pain, urine leakage, and vision changes.    Vital Signs:  Patient profile:   77 year old male Height:      67 inches Weight:      168.4 pounds BMI:     26.47 Pulse rate:   80 / minute Pulse rhythm:   regular BP sitting:   110 / 62  (left arm) Cuff size:   regular  Vitals Entered By: Harlow Mares CMA Duncan Dull) (November 17, 2009 11:37 AM)  Physical Exam  General:  Well developed, well nourished, no acute distress.healthy appearing.   Head:  Normocephalic and atraumatic. Eyes:  PERRLA, no icterus.exam deferred to patient's ophthalmologist.   Lungs:  Clear throughout to auscultation. Heart:  Regular rate and rhythm; no murmurs, rubs,  or bruits. Abdomen:  Soft, nontender and nondistended. No masses, hepatosplenomegaly or hernias noted. Normal bowel sounds. Psych:  Alert and cooperative. Normal mood and affect.   Impression & Recommendations:  Problem # 1:  GI BLEED (ICD-578.9) Assessment Improved continue daily pantoprazole with p.r.n. GI followup as needed. I see no need at this time for repeat endoscopic exam. His CBC and iron levels are followed by primary care. Cardiology apparently has elected not to restart Plavix but to leave this patient on regular low-dose aspirin  therapy.  Problem # 2:  BLOOD IN STOOL-MELENA (ICD-578.1) Assessment: Improved  Problem # 3:  CORONARY ARTERY DISEASE (ICD-414.00) Assessment: Unchanged  Problem # 4:  COPD (ICD-496) Assessment: Unchanged  Patient Instructions: 1)  Copy sent to : Rudi Heap, MD 2)  Decrease you Protonix to once a day. 3)  Please continue current medications.  4)  Please schedule a follow-up appointment as needed.  5)  The medication list was reviewed and reconciled.  All changed / newly prescribed medications were explained.  A complete medication list was provided to the patient / caregiver.

## 2010-03-30 NOTE — Assessment & Plan Note (Signed)
Summary: FU---$50--STC   Vital Signs:  Patient profile:   77 year old male Height:      67 inches Weight:      160 pounds BMI:     25.15 Temp:     97.0 degrees F oral Pulse rate:   77 / minute BP sitting:   112 / 58  (left arm) Cuff size:   regular  Vitals Entered By: Bill Salinas CMA (September 11, 2008 12:59 PM) CC: follow-up visit/ pt is also taking VD 1000 units daily/ ab   Primary Provider:  d Hayden Rasmussen  CC:  follow-up visit/ pt is also taking VD 1000 units daily/ ab.  History of Present Illness: pt states he feels well in general.  specifically, he denies nausea.  he has slight dizziness. he takes synthroid as rx'ed. he denies headache or loss of vision.  Current Medications (verified): 1)  Singulair 10 Mg  Tabs (Montelukast Sodium) .... Take 1 By Mouth Qd 2)  Plavix 75 Mg  Tabs (Clopidogrel Bisulfate) .... Take 1 By Mouth Qd 3)  Pravachol 40 Mg  Tabs (Pravastatin Sodium) .... Take 1 By Mouth Qd 4)  Actonel 150 Mg  Tabs (Risedronate Sodium) .... Take 1 By Mouth Q Month 5)  Hyoscyamine Sulfate 0.125 Mg  Tabs (Hyoscyamine Sulfate) .... Use Prn 6)  Nitroglycerin 0.4 Mg  Subl (Nitroglycerin) .... Use Prn 7)  Aspirin 81 Mg  Tbec (Aspirin) .... Take 1 By Mouth Qd 8)  Synthroid 50 Mcg  Tabs (Levothyroxine Sodium) .... Qd 9)  Parlodel 5 Mg  Caps (Bromocriptine Mesylate) .... Qhs 10)  Proventil Hfa 108 (90 Base) Mcg/act Aers (Albuterol Sulfate) .... 2 Puffs Four Times A Day As Needed  Allergies (verified): 1)  ! Pcn  Past History:  Past Medical History: Last updated: 05/19/2008 HYPOTHYROIDISM (ICD-244.9a PITUITARY ADENOMA (ICD-227.3) treated wih bromocriptine and monitiored HYPERLIPIDEMIA (ICD-272.4) CORONARY ARTERY DISEASE (ICD-414.00)-stents COPD (ICD-496) Bonchiectasis RLL- CT 2009  Review of Systems  The patient denies weight loss and weight gain.    Physical Exam  General:  normal appearance.   Neck:  no masses, thyromegaly, or abnormal cervical  nodes Additional Exam:  MRI HEAD WITHOUT AND WITH CONTRAST Pituitary macroadenoma the left is unchanged measuring 12 x 16 mm.  FastTSH                   1.61 uIU/mL                 0.35-5.50 Prolactin                 10.8 ng/mL  Free T4                   0.9 ng/dL    Impression & Recommendations:  Problem # 1:  HYPERPROLACTINEMIA (ICD-253.1) well-controlled  Problem # 2:  HYPOTHYROIDISM (ICD-244.9) well-replaced  Problem # 3:  PITUITARY ADENOMA (ICD-227.3) causing #1 by stalk compression, if they are related at all.  Other Orders: TLB-TSH (Thyroid Stimulating Hormone) (84443-TSH) TLB-Prolactin (84146-PROL) TLB-T4 (Thyrox), Free [phone]) Est. Patient Level IV (11914)  Patient Instructions: 1)  return 1 year 2)  same medications 3)  tests are being ordered for you today.  a few days after the test(s), please call [phone] to hear your test results.  this is very important to do because the results may change the instructions you see here

## 2010-03-30 NOTE — Progress Notes (Signed)
Summary: NEED RESULTS of PFT  Phone Note Call from Patient Call back at [phone]   Caller: Patient Call For: [name] Summary of Call: CALLING FOR RESULTS OF PFT Initial call taken by: Rickard Patience,  August 21, 2007 8:43 AM  Follow-up for Phone Call        pt requesting pft results. please advise.  Cyndia Diver LPN  August 21, 2007 10:26 AM   Additional Follow-up for Phone Call Additional follow up Details #1::        PFT discussed with him.Copies sent to Dr. Daphine Deutscher. Additional Follow-up by: Waymon Budge MD,  August 22, 2007 4:20 PM

## 2010-03-30 NOTE — Progress Notes (Signed)
Summary: please fax lab orders   Phone Note Call from Patient Call back at Home Phone [phone]   Caller: Patient Call For: Jarold Motto Reason for Call: Talk to Nurse Summary of Call: Patient wants to know if we can fax lab orders to [facility]. Dr Christell Constant so that he can have it done there for his repeat labs [phone] Initial call taken by: Tawni Levy,  September 01, 2009 11:05 AM  Follow-up for Phone Call        Order faxed to Dr. Kathi Der office.  Pt notified.  Will have cbc drawn last week of July. Follow-up by: Ashok Cordia RN,  September 02, 2009 11:09 AM

## 2010-03-30 NOTE — Letter (Signed)
Summary: Hiatal Hernia/[facility]  Hiatal Hernia/[facility]   Imported By: Sherian Rein 05/07/2008 14:45:19  _____________________________________________________________________  External Attachment:    Type:   Image     Comment:   External Document

## 2010-03-30 NOTE — Procedures (Signed)
Summary: Colonoscopy   Colonoscopy  Procedure date:  05/30/2005  Findings:      Location:  [facility].    Procedures Next Due Date:    Colonoscopy: 05/2010 Patient Name: Tremayne, Sheldon MRN: [id number] Procedure Procedures: Colonoscopy CPT: 40981.  Personnel: Endoscopist: Vania Rea. Jarold Motto, MD.  Exam Location: Exam performed in Outpatient Clinic. Outpatient  Patient Consent: Procedure, Alternatives, Risks and Benefits discussed, consent obtained, from patient. Consent was obtained by the RN.  Indications Symptoms: Hematochezia.  Surveillance of: Adenomatous Polyp(s).  History  Current Medications: Patient is not currently taking Coumadin.  Pre-Exam Physical: Performed May 30, 2005. Cardio-pulmonary exam, Rectal exam, Abdominal exam, Extremity exam, Mental status exam WNL.  Comments: Pt. history reviewed/updated, physical exam performed prior to initiation of sedation?yes Exam Exam: Extent of exam reached: Cecum, extent intended: Cecum.  The cecum was identified by appendiceal orifice and IC valve. Patient position: on left side. Duration of exam: 20 minutes. Colon retroflexion performed. Images taken. ASA Classification: II. Tolerance: excellent.  Monitoring: Pulse and BP monitoring, Oximetry used. Supplemental O2 given. at 2 Liters.  Colon Prep Used Golytely for colon prep. Prep results: good.  Sedation Meds: Patient assessed and found to be appropriate for moderate (conscious) sedation. Fentanyl 50 mcg. given IV. Versed 7 mg. given IV.  Instrument(s): CF 140L. Serial [id number].  Findings - DIVERTICULOSIS: Descending Colon to Sigmoid Colon. Not bleeding. ICD9: Diverticulosis, Colon: 562.10.  - MULTIPLE POLYPS: Cecum to Hepatic Flexure. minimum size 2 mm, maximum size 3 mm. Procedure:  snare with cautery, removed, Polyp not retrieved, ICD9: Colon Polyps: 211.3. Comments: One cold biopsy sent foe exam.  - NORMAL EXAM: Cecum to Rectum. Not  Seen: AVM's. Tumors. Melanosis. Crohn's. Hemorrhoids.   Assessment  Diagnoses: 211.3: Colon Polyps.  562.10: Diverticulosis, Colon.   Events  Unplanned Interventions: No intervention was required.  Plans  Post Exam Instructions: No aspirin or non-steroidal containing medications: 2 weeks.  Medication Plan: Await pathology. Referring provider to order medications.  Patient Education: Patient given standard instructions for: Diverticulosis. Patient instructed to get routine colonoscopy every 5 years.  Disposition: After procedure patient sent to recovery. After recovery patient sent home.  Scheduling/Referral: Await pathology to schedule patient. EGD, to [facility]. Jarold Motto, MD, on May 30, 2005.   RUT= NEG   CC: Rudi Heap, MD  This report was created from the original endoscopy report, which was reviewed and signed by the above listed endoscopist.

## 2010-03-30 NOTE — Progress Notes (Signed)
Summary: rx refill req  Phone Note Refill Request Message from:  Fax from Pharmacy on November 09, 2009 1:19 PM  Refills Requested: Medication #1:  PARLODEL 5 MG  CAPS qhs   Dosage confirmed as above?Dosage Confirmed   Last Refilled: 10/05/2009  Method Requested: Fax to Local Pharmacy Initial call taken by: Brenton Grills MA,  November 09, 2009 1:20 PM    Prescriptions: PARLODEL 5 MG  CAPS (BROMOCRIPTINE MESYLATE) qhs  #30 x 2   Entered by:   Brenton Grills MA   Authorized by:   Minus Breeding MD   Signed by:   Brenton Grills MA on 11/09/2009   Method used:   Faxed to ...       Hospital doctor (retail)       [address]       [place], [place]  [address]       Ph: [phone] or [phone]       Fax: [phone]   RxID:   [id number]

## 2010-03-30 NOTE — Procedures (Signed)
Summary: Upper Endoscopy  Patient: [name] Note: All result statuses are Final unless otherwise noted.  Tests: (1) Upper Endoscopy (EGD)   EGD Upper Endoscopy       DONE     [facility]     [address]     [place], [place]  [address]           ENDOSCOPY PROCEDURE REPORT           PATIENT:  [name], [name]  MR#:  [id number]     BIRTHDATE:  May 07, 1932, 76 yrs. old  GENDER:  male           ENDOSCOPIST:  Iva Boop, MD, [facility]           PROCEDURE DATE:  08/14/2009     PROCEDURE:  EGD, diagnostic     ASA CLASS:  Class III     INDICATIONS:  follow-up of esophageal ulcer with prior bleeding     and ? of recurrent bleeding           MEDICATIONS:   Fentanyl 25 mcg IV, Versed 4 mg IV     TOPICAL ANESTHETIC:           DESCRIPTION OF PROCEDURE:   After the risks benefits and     alternatives of the procedure were thoroughly explained, informed     consent was obtained.  The EG-2990i ([id number]) endoscope was     introduced through the mouth and advanced to the second portion of     the duodenum, without limitations.  The instrument was slowly     withdrawn as the mucosa was fully examined.     <<PROCEDUREIMAGES>>           An ulcer was found in the distal esophagus. small ulcer with clip,     clean-based and not bleeding. Findings also demonstrate prior     fundoplication with small hiatal hernia effect.  Otherwise the     examination was normal.    Retroflexed views also revealed prior     fundoplication.    The scope was then withdrawn from the patient     and the procedure completed. Rectal exam showed dark brown stool,     no melena.           COMPLICATIONS:  None           ENDOSCOPIC IMPRESSION:     1) Ulcer in the distal esophagus - clipped and healing. Do not     think this is still bleeding.     2) S/p fundoplication     3) Otherwise normal examination     RECOMMENDATIONS:     Do not restart Alendronate as I think it may have caused the  ulcer.     Should be able to restart Plavix in 1-2 weeks.     Restart ASA now due to prior drug-eluting stent.     Stay on bid PPI until follow-up with GI.     Transfuse again to improve Hgb given cardiac problems.           REPEAT EXAM:  In for as needed.           Iva Boop, MD, [facility]           CC:  Rudi Heap, MD           n.     Rosalie Doctor:   Iva Boop at 08/14/2009 02:30 PM  [place], [place], [id number]  Note: An exclamation mark (!) indicates a result that was not dispersed into the flowsheet. Document Creation Date: 08/14/2009 2:31 PM _______________________________________________________________________  (1) Order result status: Final Collection or observation date-time: 08/14/2009 14:10 Requested date-time:  Receipt date-time:  Reported date-time:  Referring Physician:   Ordering Physician: Stan Head [phone]) Specimen Source:  Source: Launa Grill Order Number: [id number] Lab site:

## 2010-03-30 NOTE — Progress Notes (Signed)
Summary: synthroid  Phone Note Refill Request Message from:  Fax from Pharmacy on November 09, 2009 4:13 PM  Refills Requested: Medication #1:  SYNTHROID 50 MCG  TABS qd   Dosage confirmed as above?Dosage Confirmed   Last Refilled: 09/28/2009  Method Requested: Fax to Local Pharmacy Initial call taken by: Brenton Grills MA,  November 09, 2009 4:14 PM    Prescriptions: SYNTHROID 50 MCG  TABS (LEVOTHYROXINE SODIUM) qd  #30 x 3   Entered by:   Brenton Grills MA   Authorized by:   Minus Breeding MD   Signed by:   Brenton Grills MA on 11/09/2009   Method used:   Faxed to ...       Hospital doctor (retail)       [address]       [place], [place]  [address]       Ph: [phone] or [phone]       Fax: [phone]   RxID:   [id number]

## 2010-03-30 NOTE — Progress Notes (Signed)
Summary:  parlodel  ---- Converted from flag ---- ---- 07/27/2007 4:39 PM, Minus Breeding MD wrote: please fax rx for parlodel to pharmacy #30 and 11 rf's ------------------------------  Called medication into [facility] @ [phone] spoke with Stew/LMB

## 2010-03-30 NOTE — Letter (Signed)
Summary: Hiatal hernia repair/[facility]  Hiatal hernia repair/[facility]   Imported By: Lester [place] Draft 02/15/2008 11:20:14  _____________________________________________________________________  External Attachment:    Type:   Image     Comment:   External Document

## 2010-03-30 NOTE — Assessment & Plan Note (Signed)
Summary: [facility]  Medications Added FERRETTS 325 (106 FE) MG TABS (FERROUS FUMARATE) 2 by mouth daily MULTIVITAMINS   TABS (MULTIPLE VITAMIN) 1 by mouth daily      Allergies Added:   Visit Type:  Follow-up Primary Provider:  Rudi Heap, MD  CC:  CAD.  History of Present Illness: The patient presents for followup. Since I last saw him he has been hospitalized with an apparent pneumonia. He had no cardiac complications during hospitalization. Since going home he spell a week. He suffered from continued vertigo area and he has yet to restart rehabilitation which he was doing prior to his recent hospitalizations. He is not describing any acute cardiovascular symptoms such as chest pain, neck or arm discomfort. He is not describing any palpitations, presyncope or syncope. He's had no new shortness of breath, PND or orthopnea. He denies any fevers chills or cough.  Current Medications (verified): 1)  Singulair 10 Mg  Tabs (Montelukast Sodium) .... Take 1 By Mouth Once Daily As Needed 2)  Plavix 75 Mg  Tabs (Clopidogrel Bisulfate) .Marland Kitchen.. 1 Once Daily 3)  Pravachol 40 Mg  Tabs (Pravastatin Sodium) .... Take 1 By Mouth Qd 4)  Nitroglycerin 0.4 Mg  Subl (Nitroglycerin) .... Use Prn 5)  Aspirin 81 Mg  Tbec (Aspirin) .... Take 1 By Mouth Qd 6)  Synthroid 50 Mcg  Tabs (Levothyroxine Sodium) .... Qd 7)  Parlodel 5 Mg  Caps (Bromocriptine Mesylate) .... Qhs 8)  Proventil Hfa 108 (90 Base) Mcg/act Aers (Albuterol Sulfate) .... 2 Puffs Four Times A Day As Needed 9)  Fish Oil 1200 Mg Caps (Omega-3 Fatty Acids) .... Take 1 By Mouth Once Daily 10)  Vitamin D 1000 Unit Tabs (Cholecalciferol) .... Take 2 By Mouth Once Daily 11)  Meclizine Hcl 25 Mg Tabs (Meclizine Hcl) .Marland Kitchen.. 1 Two Times A Day As Needed Vertigo 12)  Docusate Sodium 100 Mg Caps (Docusate Sodium) .... Take 1 By Mouth Once Daily 13)  Pantoprazole Sodium 40 Mg Tbec (Pantoprazole Sodium) .... Take 1 Tablet Qd 14)  Ferretts 325 (106 Fe)  Mg Tabs (Ferrous Fumarate) .... 2 By Mouth Daily 15)  Multivitamins   Tabs (Multiple Vitamin) .Marland Kitchen.. 1 By Mouth Daily  Allergies (verified): 1)  ! Pcn  Past History:  Past Medical History: Reviewed history from 07/17/2008 and no changes required. HYPOTENSION (ICD-458.9) DYSPNEA (ICD-786.05) HYPERLIPIDEMIA (ICD-272.4) CORONARY ARTERY DISEASE (ICD-414.00) BENIGN PROSTATIC HYPERTROPHY, HX OF (ICD-V13.8) DIVERTICULOSIS, MILD (ICD-562.10) OSTEOPOROSIS (ICD-733.00) DEGENERATIVE JOINT DISEASE (ICD-715.90) ESOPHAGITIS (ICD-530.10) ANEMIA (ICD-285.9) COLONIC POLYPS (ICD-211.3) HYPERPROLACTINEMIA (ICD-253.1) OTHER SPECIFIED HYPOTENSION (ICD-458.8) HYPOTHYROIDISM (ICD-244.9) PITUITARY ADENOMA (ICD-227.3) COPD (ICD-496) Bonchiectasis RLL- CT 2009  Past Surgical History: Inguinal herniorrhaphy PTCA/stent LEFT & RIGHT FOOT RECONSTRUCTION Hiatal hernia repair 2009  Review of Systems       Vertigo.  Otherwise as stated in the history of present illness negative for all other systems.  Vital Signs:  Patient profile:   77 year old male Height:      67 inches Weight:      163 pounds Pulse rate:   79 / minute Resp:     16 per minute BP sitting:   116 / 78  (right arm)  Vitals Entered By: [name] Coy, CNA (October 07, 2009 1:40 PM)  Physical Exam  General:  Well developed, well nourished, in no acute distress. Head:  normocephalic and atraumatic Eyes:  PERRLA/EOM intact; conjunctiva and lids normal. Neck:  Neck supple, no JVD. No masses, thyromegaly or abnormal cervical nodes. Chest Wall:  no  deformities or breast masses noted Lungs:  Clear bilaterally to auscultation and percussion. Heart:  Non-displaced PMI, chest non-tender; regular rate and rhythm, S1, S2 without murmurs, rubs or gallops. Carotid upstroke normal, no bruit. Normal abdominal aortic size, no bruits. Femorals normal pulses, no bruits. Pedals normal pulses. No edema, no varicosities. Abdomen:  Bowel sounds positive;  abdomen soft and non-tender without masses, organomegaly, or hernias noted. No hepatosplenomegaly. Msk:  Back normal, normal gait. Muscle strength and tone normal. Extremities:  No clubbing or cyanosis. Neurologic:  Alert and oriented x 3. Psych:  Normal affect.   EKG  Procedure date:  10/07/2009  Findings:      Sinus rhythm, rate 96 axis within normal limits, intervals within normal limits, no acute ST-T wave changes.  Impression & Recommendations:  Problem # 1:  CORONARY ARTERY DISEASE (ICD-414.00) The patient has had no new symptoms. He will continue with risk reduction. No further testing is planned at this point. Orders: EKG w/ Interpretation (93000)  Problem # 2:  HYPOTENSION (ICD-458.9) His blood pressure seems to be stable. He will continue the meds as listed.  Problem # 3:  HYPERLIPIDEMIA (ICD-272.4) I will defer to his primary MD. His updated medication list for this problem includes:    Pravachol 40 Mg Tabs (Pravastatin sodium) .Marland Kitchen... Take 1 by mouth qd  Patient Instructions: 1)  Your physician recommends that you schedule a follow-up appointment in: 3 months with Dr Antoine Poche in [place] 2)  Your physician recommends that you continue on your current medications as directed. Please refer to the Current Medication list given to you today.

## 2010-03-30 NOTE — Progress Notes (Signed)
Summary: OV due  Phone Note Outgoing Call   Call placed by: Brenton Grills MA,  September 04, 2009 3:59 PM Details for Reason: OV due Details of Action Taken: P Summary of Call: per MD, pt due for FU OV, spoke with pt's spouse to schedule appt  Follow-up for Phone Call        Appointment scheduled 09/15/09 10:15am Follow-up by: Brenton Grills MA,  September 04, 2009 4:02 PM

## 2010-03-30 NOTE — Progress Notes (Signed)
Summary: SAMPLES  Medications Added NEXIUM 40 MG CPDR (ESOMEPRAZOLE MAGNESIUM) Take 1 capsule by mouth twice a day       Phone Note Call from Patient   Caller: Patient Call For: YOUNG Summary of Call: PT WANTS SAMPLES OF SPIRIVA OR ADVAIR. CHART READS TO CHART CONTROL 02/05/07 PATIENT'S CHART HAS BEEN REQUESTED Initial call taken by: Tivis Ringer,  February 05, 2007 2:59 PM  Follow-up for Phone Call        Pt aware that we do not provide samples unless pt is here for an ov.  Offered to call in rx, pt declined, already has rx Follow-up by: Vernie Murders,  February 05, 2007 4:25 PM    New/Updated Medications: NEXIUM 40 MG CPDR (ESOMEPRAZOLE MAGNESIUM) Take 1 capsule by mouth twice a day

## 2010-03-30 NOTE — Progress Notes (Signed)
  Phone Note Refill Request Message from:  Fax from Pharmacy on Jul 02, 2008 1:29 PM  Refills Requested: Medication #1:  SYNTHROID 50 MCG  TABS qd Initial call taken by: Ami Bullins CMA,  Jul 02, 2008 1:30 PM      Prescriptions: SYNTHROID 50 MCG  TABS (LEVOTHYROXINE SODIUM) qd  #30 x 0   Entered by:   Ami Bullins CMA   Authorized by:   Minus Breeding MD   Signed by:   Bill Salinas CMA on 07/02/2008   Method used:   Faxed to ...       Hospital doctor (retail)       [address]       [place], [place]  [address]       Ph: [phone] or [phone]       Fax: [phone]   RxID:   [id number]

## 2010-03-30 NOTE — Progress Notes (Signed)
Summary: samples   Phone Note Call from Patient Call back at Home Phone [phone]   Caller: Patient Call For: Cael Worth Reason for Call: Talk to Nurse Details for Reason: samples Summary of Call: pt was sch for HIATAL HERNIA but pt had to CX because he was in ICU. pt is in the donut hole with his ins. Would like to know if there is any samples of  NEXIUM or  KAPIDEX samples on either one would be great Initial call taken by: Guadlupe Spanish [facility],  October 15, 2007 11:37 AM  Follow-up for Phone Call        nexium samples up front, pt to pick up. he will resch his hiatal hernia surgery when cleared by Dr. [name], Dr. Antoine Poche. Follow-up by: Harlow Mares CMA,  October 15, 2007 1:10 PM

## 2010-03-30 NOTE — Assessment & Plan Note (Signed)
Summary: 1 YEAR//MBW   Primary Provider/Referring Provider:  d Hayden Rasmussen  CC:  Yearly follow up visit.  History of Present Illness: 11/22/07-  77 year old man returning for follow-up of COPD.  Complicating problems of coronary disease and pituitary adenoma.  3/09-CT bronchiectasis right lower lobe. Hospitalized in July for hypotension and medications were changed.  Had to put off hiatal hernia surgery, now scheduled for November 6. often wakes at night with phlegm in his throat.  Mucus is usually gray, sometimes a tiny speck of blood.  Notices a band like discomfort around the lower rib cage or upper abdomen.  MRI of the lower spine is pending.  05/19/08- COPD, Dyspnea Successful lap hiatal hernia repair. Sees much improvement in lungs with little need for respiratory meds. Wants refill singulair in case needed. Does have a Proventil HFA but wants to keep that available as well.  Some morning phlegm, but much less. No routine wheeze. Some chest congestion if he leans over much. Residual mild hoarseness since the surgery.  June 25, 2009- COPD, dyspnea......................................Marland Kitchenwife here Breathing continues much better since hiatal hernia repair. No longer as sensitive to cool air. Muggy heat still bothers. He still notes cough and wheeze, but also better than it used to be. Having some vertigo with position change.. Denies major respiratory or heart problems, but notes occasional substernal "feeling" with exertion.    Current Medications (verified): 1)  Singulair 10 Mg  Tabs (Montelukast Sodium) .... Take 1 By Mouth Qd 2)  Plavix 75 Mg  Tabs (Clopidogrel Bisulfate) .... Take 1 By Mouth Qd 3)  Pravachol 40 Mg  Tabs (Pravastatin Sodium) .... Take 1 By Mouth Qd 4)  Actonel 150 Mg  Tabs (Risedronate Sodium) .... Take 1 By Mouth Q Month 5)  Hyoscyamine Sulfate 0.125 Mg  Tabs (Hyoscyamine Sulfate) .... Use Prn 6)  Nitroglycerin 0.4 Mg  Subl (Nitroglycerin) .... Use Prn 7)   Aspirin 81 Mg  Tbec (Aspirin) .... Take 1 By Mouth Qd 8)  Synthroid 50 Mcg  Tabs (Levothyroxine Sodium) .... Qd 9)  Parlodel 5 Mg  Caps (Bromocriptine Mesylate) .... Qhs 10)  Proventil Hfa 108 (90 Base) Mcg/act Aers (Albuterol Sulfate) .... 2 Puffs Four Times A Day As Needed 11)  Fish Oil 1200 Mg Caps (Omega-3 Fatty Acids) .... Take 1 By Mouth Once Daily 12)  Vitamin D 1000 Unit Tabs (Cholecalciferol) .... Take 2 By Mouth Once Daily  Allergies (verified): 1)  ! Pcn  Past History:  Past Medical History: Last updated: 07/17/2008 HYPOTENSION (ICD-458.9) DYSPNEA (ICD-786.05) HYPERLIPIDEMIA (ICD-272.4) CORONARY ARTERY DISEASE (ICD-414.00) BENIGN PROSTATIC HYPERTROPHY, HX OF (ICD-V13.8) DIVERTICULOSIS, MILD (ICD-562.10) OSTEOPOROSIS (ICD-733.00) DEGENERATIVE JOINT DISEASE (ICD-715.90) ESOPHAGITIS (ICD-530.10) ANEMIA (ICD-285.9) COLONIC POLYPS (ICD-211.3) HYPERPROLACTINEMIA (ICD-253.1) OTHER SPECIFIED HYPOTENSION (ICD-458.8) HYPOTHYROIDISM (ICD-244.9) PITUITARY ADENOMA (ICD-227.3) COPD (ICD-496) Bonchiectasis RLL- CT 2009  Past Surgical History: Last updated: 05/19/2008 Inguinal herniorrhaphy PTCA/stent LEFT & RIGHT FOOT RECONSTRUCTION hiatal hernia repair 2009  Family History: Last updated: 08/08/2007 neg for pituitary dz mother died age 67 from phlebitis father died age 64 from heart failure 8  siblings  alive age 33,80,89,92 1 died age 2 heart failure 3 died age 33,76,90 from heart problems  Social History: Last updated: 08/08/2007 married 2 children retiired never smoked no etoh  Risk Factors: Smoking Status: never (06/22/2007)  Review of Systems      See HPI       The patient complains of dyspnea on exertion.  The patient denies anorexia, fever, weight loss, weight gain, vision loss, decreased  hearing, hoarseness, chest pain, syncope, peripheral edema, prolonged cough, headaches, hemoptysis, abdominal pain, and severe indigestion/heartburn.    Vital  Signs:  Patient profile:   77 year old male Height:      67 inches Weight:      169.13 pounds BMI:     26.59 O2 Sat:      99 % on Room air Pulse rate:   72 / minute BP sitting:   120 / 78  (left arm) Cuff size:   regular  Vitals Entered By: Reynaldo Minium CMA (June 25, 2009 9:56 AM)  O2 Flow:  Room air  Physical Exam  Additional Exam:  General: A/Ox3; pleasant and cooperative, NAD, SKIN: no rash, lesions NODES: no lymphadenopathy HEENT: [place]/AT, EOM- WNL, Conjuctivae- clear, PERRLA, TM-WNL, Nose- clear, Throat- clear and wnl, Mallampati  II NECK: Supple w/ fair ROM, JVD- none, normal carotid impulses w/o bruits  CHEST: Clear with quiet breathing HEART: RRR, no m/g/r heard ABDOMEN:  YTK:ZSWF, nl pulses, no edema  NEURO: Grossly intact to observation, no nystagmus      Impression & Recommendations:  Problem # 1:  COPD (ICD-496) Clinically stable. We discussed indication for PFT. We Mindy't need to change meds. He feels Singulair helps enough to use, especially in pollen season.  Problem # 2:  CORONARY ARTERY DISEASE (ICD-414.00) We talked ab out his substernal "feeling"- very vague and nonspecific. His updated medication list for this problem includes:    Plavix 75 Mg Tabs (Clopidogrel bisulfate) .Marland Kitchen... Take 1 by mouth qd    Nitroglycerin 0.4 Mg Subl (Nitroglycerin) ..... Use prn    Aspirin 81 Mg Tbec (Aspirin) .Marland Kitchen... Take 1 by mouth qd  Medications Added to Medication List This Visit: 1)  Fish Oil 1200 Mg Caps (Omega-3 fatty acids) .... Take 1 by mouth once daily 2)  Vitamin D 1000 Unit Tabs (Cholecalciferol) .... Take 2 by mouth once daily 3)  Meclizine Hcl 25 Mg Tabs (Meclizine hcl) .Marland Kitchen.. 1 two times a day as needed vertigo  Other Orders: Est. Patient Level III (09323)  Patient Instructions: 1)  Please schedule a follow-up appointment in 1 year. 2)  continue present meds

## 2010-03-30 NOTE — Progress Notes (Signed)
Summary: surgery clearance  Phone Note Call from Patient   Caller: Patient Call For: young Summary of Call: pt scheduled to have surgery 7/15. req surgery clearance.(surgery will be w/ dr Baxter Kail [facility]). please call pt when this has been done.  Initial call taken by: Tivis Ringer,  Jul 19, 2007 2:22 PM  Follow-up for Phone Call        ov with cdy on 5-27 at 9:40 to get surgery clearance. Follow-up by: Vernie Murders,  Jul 19, 2007 2:37 PM

## 2010-03-30 NOTE — Progress Notes (Signed)
  Phone Note Refill Request Message from:  Fax from Pharmacy on December 22, 2008 3:06 PM  Refills Requested: Medication #1:  PARLODEL 5 MG  CAPS qhs Initial call taken by: Robin ewing CMA,  December 22, 2008 3:06 PM    Prescriptions: PARLODEL 5 MG  CAPS (BROMOCRIPTINE MESYLATE) qhs  #30 x 1   Entered by:   Ami Bullins CMA   Authorized by:   Corwin Levins MD   Signed by:   Bill Salinas CMA on 12/22/2008   Method used:   Faxed to ...       Hospital doctor (retail)       [address]       [place], [place]  [address]       Ph: [phone] or [phone]       Fax: [phone]   RxID:   [id number]

## 2010-03-30 NOTE — Progress Notes (Signed)
Summary: OV due  ---- Converted from flag ---- ---- 12/13/2009 12:25 PM, Minus Breeding MD wrote: [name] is due ------------------------------  Phone Note Outgoing Call   Call placed by: Brenton Grills MA,  December 14, 2009 11:31 AM Call placed to: Patient Summary of Call: Per Flag from MD, OV is due.  Follow-up for Phone Call        Appointment scheduled 12/15/2009 2:00pm Follow-up by: Brenton Grills MA,  December 14, 2009 11:31 AM

## 2010-03-30 NOTE — Miscellaneous (Signed)
Summary: Orders Update pft charges  Clinical Lists Changes  Orders: Added new Service order of Carbon Monoxide diffusing w/capacity (94720) - Signed Added new Service order of Lung Volumes (94240) - Signed Added new Service order of Spirometry (Pre & Post) (94060) - Signed 

## 2010-03-30 NOTE — Miscellaneous (Signed)
  Clinical Lists Changes  Observations: Added new observation of CT SCAN: Satisfactory opacification of the pulmonary arteries   noted, and there is no evidence of pulmonary emboli. No evidence of   thoracic aortic aneurysm or dissection.  No evidence of hilar   mediastinal masses.  No adenopathy identified elsewhere in the   thorax.  A small hiatal hernia again noted.    Right lower lobe airspace disease is seen as well as fluid and   right lower lobe bronchi.  This is consistent with pneumonia.   There is mild atelectasis in the left lower lobe.  No evidence of   pleural effusion.    Review of the MIP images confirms the above findings.    IMPRESSION:    1.  No evidence of pulmonary embolism.   2.  Right lower lobe consolidation, consistent with pneumonia. Post-   treatment  radiographic followup recommended to confirm resolution.  (09/15/2009 10:53)      CT Scan  Procedure date:  09/15/2009  Findings:      Satisfactory opacification of the pulmonary arteries   noted, and there is no evidence of pulmonary emboli. No evidence of   thoracic aortic aneurysm or dissection.  No evidence of hilar   mediastinal masses.  No adenopathy identified elsewhere in the   thorax.  A small hiatal hernia again noted.    Right lower lobe airspace disease is seen as well as fluid and   right lower lobe bronchi.  This is consistent with pneumonia.   There is mild atelectasis in the left lower lobe.  No evidence of   pleural effusion.    Review of the MIP images confirms the above findings.    IMPRESSION:    1.  No evidence of pulmonary embolism.   2.  Right lower lobe consolidation, consistent with pneumonia. Post-   treatment  radiographic followup recommended to confirm resolution.

## 2010-03-30 NOTE — Progress Notes (Signed)
  Phone Note Refill Request Message from:  Pharmacy on October 22, 2008 2:48 PM  Refills Requested: Medication #1:  PARLODEL 5 MG  CAPS qhs Initial call taken by: Ami Bullins CMA,  October 22, 2008 2:49 PM  Follow-up for Phone Call        can this be refilled? Please Advise Follow-up by: Ami Bullins CMA,  October 22, 2008 2:49 PM  Additional Follow-up for Phone Call Additional follow up Details #1::        done hardcopy to LIM side B - dahlia  Additional Follow-up by: Corwin Levins MD,  October 22, 2008 6:15 PM    Additional Follow-up for Phone Call Additional follow up Details #2::    called pt and made aware ready for pick up Follow-up by: Windell Norfolk,  October 23, 2008 10:42 AM  Prescriptions: PARLODEL 5 MG  CAPS (BROMOCRIPTINE MESYLATE) qhs  #30 x 1   Entered and Authorized by:   Bill Salinas CMA   Signed by:   Corwin Levins MD on 10/22/2008   Method used:   Print then Give to Patient   RxID:   [id number]

## 2010-03-30 NOTE — Assessment & Plan Note (Signed)
Summary: 6 min walk   Nurse Visit   Vital Signs:  Patient Profile:   77 Years Old Male Pulse rate:   74 / minute BP sitting:   96 / 66                 Prior Medications: NEXIUM 40 MG CPDR (ESOMEPRAZOLE MAGNESIUM) Take 1 capsule by mouth twice a day SPIRIVA HANDIHALER 18 MCG  CAPS (TIOTROPIUM BROMIDE MONOHYDRATE) USE AS DIRECTED SINGULAIR 10 MG  TABS (MONTELUKAST SODIUM) TAKE 1 by mouth QD PLAVIX 75 MG  TABS (CLOPIDOGREL BISULFATE) TAKE 1 by mouth QD PRAVACHOL 40 MG  TABS (PRAVASTATIN SODIUM) TAKE 1 by mouth QD DOXAZOSIN MESYLATE 8 MG  TABS (DOXAZOSIN MESYLATE) TAKE 1 by mouth QD ACTONEL 150 MG  TABS (RISEDRONATE SODIUM) TAKE 1 by mouth Q MONTH ISOSORBIDE DINITRATE 30 MG  TABS (ISOSORBIDE DINITRATE) TAKE 1 by mouth by mouth QD HYOSCYAMINE SULFATE 0.125 MG  TABS (HYOSCYAMINE SULFATE) USE PRN NITROGLYCERIN 0.4 MG  SUBL (NITROGLYCERIN) USE PRN CARAFATE 1 GM/10ML  SUSP (SUCRALFATE) TAKE 2 TEASPOON QID ASPIRIN 81 MG  TBEC (ASPIRIN) TAKE 1 by mouth QD SYNTHROID 50 MCG  TABS (LEVOTHYROXINE SODIUM) qd PARLODEL 5 MG  CAPS (BROMOCRIPTINE MESYLATE) qhs Current Allergies: No known allergies     Orders Added: 1)  Pulmonary Stress (6 min walk) [place]    ]  Six Minute Walk Test Medications taken before test(dose and time): bromocritine 5mg , spiriva, nexium 40mg , mucinex, isosorbide 15mg  at 0745 Supplemental oxygen during the test: No  Lap counter(place a tick mark inside a square for each lap completed) lap 1 complete  lap 2 complete   lap 3 complete   lap 4 complete  lap 5 complete  lap 6 complete  lap 7 complete   lap 8 complete    Baseline  BP sitting: 96/ 66 Heart rate: 74 Dyspnea ( Borg scale) 1 Fatigue (Borg scale) 1 SPO2 97  End Of Test  BP sitting: 108/ 62 Heart rate: 92 Dyspnea ( Borg scale) 3 Fatigue (Borg scale) 2 SPO2 97  2 Minutes post  BP sitting: 122/ 66 Heart rate: 78 SPO2 98  Stopped or paused before six minutes? No Other symptoms at  end of exercise: Dizziness  Interpretation: Number of laps  8 X 48 meters =   384 meters+ final partial lap: 13 meters =    397 meters   Total distance walked in six minutes: 397 meters  Tech ID: Boone Master CNA (August 15, 2007 2:11 PM) Tech Comments pt completed test with no rest breaks and c/o slight dizziness, resolved with rest.

## 2010-03-30 NOTE — Assessment & Plan Note (Signed)
Summary: FOLLOW UP/ MBW   Primary Provider/Referring Provider:  d Hayden Rasmussen  CC:  follow up visit-"breathing good as ever"; not using inhalers since ?Hital hernia repair 10-09; not as many problems with congestion since either. Pt states he feels fine without using any inhalers. .  History of Present Illness: Current Problems:  HYPERPROLACTINEMIA (ICD-253.1) OTHER SPECIFIED HYPOTENSION (ICD-458.8) DYSPNEA (ICD-786.05) HYPOTHYROIDISM (ICD-244.9) PITUITARY ADENOMA (ICD-227.3) HYPERLIPIDEMIA (ICD-272.4) CORONARY ARTERY DISEASE (ICD-414.00) COPD (ICD-496)   11/21/08-  49 year old man returning for follow-up of COPD.  Complicating problems of coronary disease and pituitary adenoma.  3/09-CT bronchiectasis right lower lobe. Hospitalized in July for hypotension and medications were changed.  Had to put off hiatal hernia surgery, now scheduled for November 6. often wakes at night with phlegm in his throat.  Mucus is usually gray, sometimes a tiny speck of blood.  Notices a band like discomfort around the lower rib cage or upper abdomen.  MRI of the lower spine is pending.  05/19/08- COPD, Dyspnea Successful lap hiatal hernia repair. Sees much improvement in lungs with little need for respiratory meds. Wants refill singulair in case needed. Does have a Proventil HFA but wants to keep that available as well.  Some morning phlegm, but much less. No routine wheeze. Some chest congestion if he leans over much. Residual mild hoarseness since the surgery.    Problems Prior to Update: 1)  Hyperprolactinemia  (ICD-253.1) 2)  Other Specified Hypotension  (ICD-458.8) 3)  Dyspnea  (ICD-786.05) 4)  Hypothyroidism  (ICD-244.9) 5)  Pituitary Adenoma  (ICD-227.3) 6)  Hyperlipidemia  (ICD-272.4) 7)  Coronary Artery Disease  (ICD-414.00) 8)  COPD  (ICD-496)  Current Medications (verified): 1)  Singulair 10 Mg  Tabs (Montelukast Sodium) .... Take 1 By Mouth Qd 2)  Plavix 75 Mg  Tabs (Clopidogrel  Bisulfate) .... Take 1 By Mouth Qd 3)  Pravachol 40 Mg  Tabs (Pravastatin Sodium) .... Take 1 By Mouth Qd 4)  Actonel 150 Mg  Tabs (Risedronate Sodium) .... Take 1 By Mouth Q Month 5)  Hyoscyamine Sulfate 0.125 Mg  Tabs (Hyoscyamine Sulfate) .... Use Prn 6)  Nitroglycerin 0.4 Mg  Subl (Nitroglycerin) .... Use Prn 7)  Aspirin 81 Mg  Tbec (Aspirin) .... Take 1 By Mouth Qd 8)  Synthroid 50 Mcg  Tabs (Levothyroxine Sodium) .... Qd 9)  Parlodel 5 Mg  Caps (Bromocriptine Mesylate) .... Qhs 10)  Proventil Hfa 108 (90 Base) Mcg/act Aers (Albuterol Sulfate) .... 2 Puffs Four Times A Day As Needed  Allergies (verified): 1)  ! Pcn  Past History:  Family History:    neg for pituitary dz    mother died age 74 from phlebitis    father died age 60 from heart failure    8  siblings     alive age 58,80,89,92    1 died age 5 heart failure    3 died age 23,76,90 from heart problems     (08/08/2007)  Social History:    married    2 children    retiired    never smoked    no etoh (08/08/2007)  Risk Factors:    Alcohol Use: N/A    >5 drinks/d w/in last 3 months: N/A    Caffeine Use: N/A    Diet: N/A    Exercise: N/A  Risk Factors:    Smoking Status: never (06/22/2007)    Packs/Day: N/A    Cigars/wk: N/A    Pipe Use/wk: N/A    Cans of tobacco/wk: N/A  Passive Smoke Exposure: N/A  Past Medical History:    HYPOTHYROIDISM (ICD-244.9a    PITUITARY ADENOMA (ICD-227.3) treated wih bromocriptine and monitiored    HYPERLIPIDEMIA (ICD-272.4)    CORONARY ARTERY DISEASE (ICD-414.00)-stents    COPD (ICD-496)    Bonchiectasis RLL- CT 2009  Past Surgical History:    Inguinal herniorrhaphy    PTCA/stent    LEFT & RIGHT FOOT RECONSTRUCTION    hiatal hernia repair 2009  Social History:    Reviewed history from 08/08/2007 and no changes required:       married       2 children       retiired       never smoked       no etoh  Review of Systems      See HPI       The patient complains  of shortness of breath with activity.  The patient denies productive cough, coughing up blood, chest pain, irregular heartbeats, acid heartburn, indigestion, loss of appetite, weight change, abdominal pain, difficulty swallowing, sore throat, headaches, nasal congestion/difficulty breathing through nose, itching, depression, joint stiffness or pain, rash, change in color of mucus, and fever.    Vital Signs:  Patient profile:   77 year old male Height:      67 inches Weight:      158.38 pounds BMI:     24.90 Pulse rate:   60 / minute BP sitting:   108 / 56  (left arm) Cuff size:   regular  Vitals Entered By: Reynaldo Minium CMA (May 19, 2008 11:08 AM)  O2 Sat on room air at rest %:  97 CC: follow up visit-"breathing good as ever"; not using inhalers since ?Hital hernia repair 10-09; not as many problems with congestion since either. Pt states he feels fine without using any inhalers.  Comments Medications reviewed with patient Reynaldo Minium CMA  May 19, 2008 11:08 AM    Physical Exam  Additional Exam:  General: A/Ox3; pleasant and cooperative, NAD, SKIN: no rash, lesions NODES: no lymphadenopathy HEENT: [place]/AT, EOM- WNL, Conjuctivae- clear, PERRLA, TM-WNL, Nose- clear, Throat- clear and wnl NECK: Supple w/ fair ROM, JVD- none, normal carotid impulses w/o bruits  CHEST: Clear with quiet breathing but rattling congestion with laughter HEART: RRR, no m/g/r heard ABDOMEN:  AVW:UJWJ, nl pulses, no edema  NEURO: Grossly intact to observation      Pulmonary Function Test Height (in.): 67 Gender: Male  Impression & Recommendations:  Problem # 1:  COPD (ICD-496) COPD with chronic bronchits. Has symptomatically improved as reflux/ aspiration, and the bulk of his hiatal hernia, have been resolved by his hernia repair surgery.Will will keep singulair and Proventil available.  Complete Medication List: 1)  Singulair 10 Mg Tabs (Montelukast sodium) .... Take 1 by mouth qd 2)  Plavix  75 Mg Tabs (Clopidogrel bisulfate) .... Take 1 by mouth qd 3)  Pravachol 40 Mg Tabs (Pravastatin sodium) .... Take 1 by mouth qd 4)  Actonel 150 Mg Tabs (Risedronate sodium) .... Take 1 by mouth q month 5)  Hyoscyamine Sulfate 0.125 Mg Tabs (Hyoscyamine sulfate) .... Use prn 6)  Nitroglycerin 0.4 Mg Subl (Nitroglycerin) .... Use prn 7)  Aspirin 81 Mg Tbec (Aspirin) .... Take 1 by mouth qd 8)  Synthroid 50 Mcg Tabs (Levothyroxine sodium) .... Qd 9)  Parlodel 5 Mg Caps (Bromocriptine mesylate) .... Qhs 10)  Proventil Hfa 108 (90 Base) Mcg/act Aers (Albuterol sulfate) .... 2 puffs four times a day as needed  Other Orders: Est. Patient Level III ([address])  Patient Instructions: 1)  Please schedule a follow-up appointment in 1 year. 2)  Scripts for Proventil and for Singulair 3)  Please call as needed 4)  I hope your wife does well. Prescriptions: PROVENTIL HFA 108 (90 BASE) MCG/ACT AERS (ALBUTEROL SULFATE) 2 puffs four times a day as needed  #1 x prn   Entered and Authorized by:   Waymon Budge MD   Signed by:   Waymon Budge MD on 05/19/2008   Method used:   Print then Give to Patient   RxID:   [id number] SINGULAIR 10 MG  TABS (MONTELUKAST SODIUM) TAKE 1 by mouth QD  #30 x prn   Entered and Authorized by:   Waymon Budge MD   Signed by:   Waymon Budge MD on 05/19/2008   Method used:   Print then Give to Patient   RxID:   [id number]

## 2010-03-30 NOTE — Progress Notes (Signed)
  Phone Note Refill Request Message from:  Fax from Pharmacy  Refills Requested: Medication #1:  PARLODEL 5 MG  CAPS qhs   Dosage confirmed as above?Dosage Confirmed    Prescriptions: PARLODEL 5 MG  CAPS (BROMOCRIPTINE MESYLATE) qhs  #30 x 1   Entered by:   Brenton Grills MA   Authorized by:   Minus Breeding MD   Signed by:   Brenton Grills MA on 09/01/2009   Method used:   Faxed to ...       [facility]       [address]       [place], [place]  [address]       Ph: [phone] or [phone]       Fax: [phone]   RxID:   [id number]

## 2010-03-30 NOTE — Consult Note (Signed)
Summary: [facility]  [facility]   Imported By: Maryln Gottron 07/03/2007 14:55:47  _____________________________________________________________________  External Attachment:    Type:   Image     Comment:   External Document

## 2010-03-30 NOTE — Consult Note (Signed)
Summary: Consultation Report/[name]  Consultation Report/[name]   Imported By: Lester [place] 07/27/2007 11:42:03  _____________________________________________________________________  External Attachment:    Type:   Image     Comment:   External Document

## 2010-05-07 ENCOUNTER — Encounter: Payer: Self-pay | Admitting: Gastroenterology

## 2010-05-13 LAB — BASIC METABOLIC PANEL
BUN: 7 mg/dL (ref 6–23)
CO2: 24 mEq/L (ref 19–32)
Calcium: 8.2 mg/dL — ABNORMAL LOW (ref 8.4–10.5)
Chloride: 110 mEq/L (ref 96–112)
Creatinine, Ser: 0.85 mg/dL (ref 0.4–1.5)
GFR calc Af Amer: >60 mL/min (ref >60)
GFR calc non Af Amer: >60 mL/min (ref >60)
Glucose, Bld: 95 mg/dL (ref 70–99)
Potassium: 3.8 mEq/L (ref 3.5–5.1)
Sodium: 139 mEq/L (ref 135–145)

## 2010-05-13 LAB — CBC
HCT: 26.8 % — ABNORMAL LOW (ref 39.0–52.0)
Hemoglobin: 9.1 g/dL — ABNORMAL LOW (ref 13.0–17.0)
MCH: 30.2 pg (ref 26.0–34.0)
MCHC: 34 g/dL (ref 30.0–36.0)
MCV: 89 fL (ref 78.0–100.0)
Platelets: 175 10*3/uL (ref 150–400)
RBC: 3.01 MIL/uL — ABNORMAL LOW (ref 4.22–5.81)
RDW: 15.8 % — ABNORMAL HIGH (ref 11.5–15.5)
WBC: 9 10*3/uL (ref 4.0–10.5)

## 2010-05-13 LAB — VANCOMYCIN, TROUGH: Vancomycin Tr: 17 ug/mL (ref 10.0–20.0)

## 2010-05-14 LAB — CULTURE, BLOOD (ROUTINE X 2)
Culture: NO GROWTH
Culture: NO GROWTH

## 2010-05-14 LAB — CBC
HCT: 22.7 % — ABNORMAL LOW (ref 39.0–52.0)
HCT: 23.6 % — ABNORMAL LOW (ref 39.0–52.0)
HCT: 25.7 % — ABNORMAL LOW (ref 39.0–52.0)
HCT: 26.1 % — ABNORMAL LOW (ref 39.0–52.0)
HCT: 26.6 % — ABNORMAL LOW (ref 39.0–52.0)
HCT: 29.4 % — ABNORMAL LOW (ref 39.0–52.0)
HCT: 29.5 % — ABNORMAL LOW (ref 39.0–52.0)
HCT: 33.6 % — ABNORMAL LOW (ref 39.0–52.0)
Hemoglobin: 10.1 g/dL — ABNORMAL LOW (ref 13.0–17.0)
Hemoglobin: 11.3 g/dL — ABNORMAL LOW (ref 13.0–17.0)
Hemoglobin: 7.8 g/dL — ABNORMAL LOW (ref 13.0–17.0)
Hemoglobin: 8.1 g/dL — ABNORMAL LOW (ref 13.0–17.0)
Hemoglobin: 8.6 g/dL — ABNORMAL LOW (ref 13.0–17.0)
Hemoglobin: 9 g/dL — ABNORMAL LOW (ref 13.0–17.0)
Hemoglobin: 9.1 g/dL — ABNORMAL LOW (ref 13.0–17.0)
Hemoglobin: 9.8 g/dL — ABNORMAL LOW (ref 13.0–17.0)
MCH: 29.4 pg (ref 26.0–34.0)
MCH: 29.9 pg (ref 26.0–34.0)
MCH: 30.1 pg (ref 26.0–34.0)
MCH: 30.2 pg (ref 26.0–34.0)
MCH: 30.3 pg (ref 26.0–34.0)
MCH: 30.4 pg (ref 26.0–34.0)
MCH: 30.5 pg (ref 26.0–34.0)
MCH: 30.6 pg (ref 26.0–34.0)
MCHC: 33.2 g/dL (ref 30.0–36.0)
MCHC: 33.5 g/dL (ref 30.0–36.0)
MCHC: 33.6 g/dL (ref 30.0–36.0)
MCHC: 34.2 g/dL (ref 30.0–36.0)
MCHC: 34.3 g/dL (ref 30.0–36.0)
MCHC: 34.4 g/dL (ref 30.0–36.0)
MCHC: 34.4 g/dL (ref 30.0–36.0)
MCHC: 34.5 g/dL (ref 30.0–36.0)
MCV: 86.1 fL (ref 78.0–100.0)
MCV: 87.1 fL (ref 78.0–100.0)
MCV: 88 fL (ref 78.0–100.0)
MCV: 88.3 fL (ref 78.0–100.0)
MCV: 88.5 fL (ref 78.0–100.0)
MCV: 89.9 fL (ref 78.0–100.0)
MCV: 91.1 fL (ref 78.0–100.0)
MCV: 91.3 fL (ref 78.0–100.0)
Platelets: 119 10*3/uL — ABNORMAL LOW (ref 150–400)
Platelets: 130 10*3/uL — ABNORMAL LOW (ref 150–400)
Platelets: 137 10*3/uL — ABNORMAL LOW (ref 150–400)
Platelets: 139 10*3/uL — ABNORMAL LOW (ref 150–400)
Platelets: 146 10*3/uL — ABNORMAL LOW (ref 150–400)
Platelets: 186 10*3/uL (ref 150–400)
Platelets: 193 10*3/uL (ref 150–400)
Platelets: 207 10*3/uL (ref 150–400)
RBC: 2.57 MIL/uL — ABNORMAL LOW (ref 4.22–5.81)
RBC: 2.71 MIL/uL — ABNORMAL LOW (ref 4.22–5.81)
RBC: 2.86 MIL/uL — ABNORMAL LOW (ref 4.22–5.81)
RBC: 2.95 MIL/uL — ABNORMAL LOW (ref 4.22–5.81)
RBC: 3.09 MIL/uL — ABNORMAL LOW (ref 4.22–5.81)
RBC: 3.23 MIL/uL — ABNORMAL LOW (ref 4.22–5.81)
RBC: 3.34 MIL/uL — ABNORMAL LOW (ref 4.22–5.81)
RBC: 3.69 MIL/uL — ABNORMAL LOW (ref 4.22–5.81)
RDW: 15.8 % — ABNORMAL HIGH (ref 11.5–15.5)
RDW: 15.8 % — ABNORMAL HIGH (ref 11.5–15.5)
RDW: 15.9 % — ABNORMAL HIGH (ref 11.5–15.5)
RDW: 15.9 % — ABNORMAL HIGH (ref 11.5–15.5)
RDW: 16 % — ABNORMAL HIGH (ref 11.5–15.5)
RDW: 16.2 % — ABNORMAL HIGH (ref 11.5–15.5)
RDW: 16.3 % — ABNORMAL HIGH (ref 11.5–15.5)
RDW: 16.4 % — ABNORMAL HIGH (ref 11.5–15.5)
WBC: 10.3 10*3/uL (ref 4.0–10.5)
WBC: 11.3 10*3/uL — ABNORMAL HIGH (ref 4.0–10.5)
WBC: 11.7 10*3/uL — ABNORMAL HIGH (ref 4.0–10.5)
WBC: 12.2 10*3/uL — ABNORMAL HIGH (ref 4.0–10.5)
WBC: 13.5 10*3/uL — ABNORMAL HIGH (ref 4.0–10.5)
WBC: 7.7 10*3/uL (ref 4.0–10.5)
WBC: 8.3 10*3/uL (ref 4.0–10.5)
WBC: 9 10*3/uL (ref 4.0–10.5)

## 2010-05-14 LAB — CARDIAC PANEL(CRET KIN+CKTOT+MB+TROPI)
CK, MB: 1.8 ng/mL (ref 0.3–4.0)
CK, MB: 2.1 ng/mL (ref 0.3–4.0)
Relative Index: INVALID (ref 0.0–2.5)
Relative Index: INVALID (ref 0.0–2.5)
Total CK: 39 U/L (ref 7–232)
Total CK: 41 U/L (ref 7–232)
Troponin I: 0.01 ng/mL (ref 0.00–0.06)
Troponin I: 0.01 ng/mL (ref 0.00–0.06)

## 2010-05-14 LAB — BASIC METABOLIC PANEL
BUN: 11 mg/dL (ref 6–23)
BUN: 19 mg/dL (ref 6–23)
BUN: 37 mg/dL — ABNORMAL HIGH (ref 6–23)
CO2: 21 mEq/L (ref 19–32)
CO2: 22 mEq/L (ref 19–32)
CO2: 22 mEq/L (ref 19–32)
Calcium: 7.7 mg/dL — ABNORMAL LOW (ref 8.4–10.5)
Calcium: 8 mg/dL — ABNORMAL LOW (ref 8.4–10.5)
Calcium: 8.7 mg/dL (ref 8.4–10.5)
Chloride: 110 mEq/L (ref 96–112)
Chloride: 110 mEq/L (ref 96–112)
Chloride: 111 mEq/L (ref 96–112)
Creatinine, Ser: 0.83 mg/dL (ref 0.4–1.5)
Creatinine, Ser: 0.88 mg/dL (ref 0.4–1.5)
Creatinine, Ser: 0.9 mg/dL (ref 0.4–1.5)
GFR calc Af Amer: >60 mL/min (ref >60)
GFR calc Af Amer: >60 mL/min (ref >60)
GFR calc Af Amer: >60 mL/min (ref >60)
GFR calc non Af Amer: >60 mL/min (ref >60)
GFR calc non Af Amer: >60 mL/min (ref >60)
GFR calc non Af Amer: >60 mL/min (ref >60)
Glucose, Bld: 84 mg/dL (ref 70–99)
Glucose, Bld: 93 mg/dL (ref 70–99)
Glucose, Bld: 98 mg/dL (ref 70–99)
Potassium: 3.5 mEq/L (ref 3.5–5.1)
Potassium: 3.8 mEq/L (ref 3.5–5.1)
Potassium: 4.5 mEq/L (ref 3.5–5.1)
Sodium: 134 mEq/L — ABNORMAL LOW (ref 135–145)
Sodium: 135 mEq/L (ref 135–145)
Sodium: 138 mEq/L (ref 135–145)

## 2010-05-14 LAB — URINALYSIS, ROUTINE W REFLEX MICROSCOPIC
Bilirubin Urine: NEGATIVE
Bilirubin Urine: NEGATIVE
Glucose, UA: NEGATIVE mg/dL
Glucose, UA: NEGATIVE mg/dL
Hgb urine dipstick: NEGATIVE
Hgb urine dipstick: NEGATIVE
Ketones, ur: 15 mg/dL — AB
Ketones, ur: 15 mg/dL — AB
Nitrite: NEGATIVE
Nitrite: NEGATIVE
Protein, ur: NEGATIVE mg/dL
Protein, ur: NEGATIVE mg/dL
Specific Gravity, Urine: 1.02 (ref 1.005–1.030)
Specific Gravity, Urine: 1.031 — ABNORMAL HIGH (ref 1.005–1.030)
Urobilinogen, UA: 0.2 mg/dL (ref 0.0–1.0)
Urobilinogen, UA: 0.2 mg/dL (ref 0.0–1.0)
pH: 5.5 (ref 5.0–8.0)
pH: 6 (ref 5.0–8.0)

## 2010-05-14 LAB — CROSSMATCH
ABO/RH(D): O POS
Antibody Screen: NEGATIVE

## 2010-05-14 LAB — COMPREHENSIVE METABOLIC PANEL
ALT: 13 U/L (ref 0–53)
AST: 24 U/L (ref 0–37)
Albumin: 2.7 g/dL — ABNORMAL LOW (ref 3.5–5.2)
Alkaline Phosphatase: 42 U/L (ref 39–117)
BUN: 48 mg/dL — ABNORMAL HIGH (ref 6–23)
CO2: 18 mEq/L — ABNORMAL LOW (ref 19–32)
Calcium: 8.1 mg/dL — ABNORMAL LOW (ref 8.4–10.5)
Chloride: 116 mEq/L — ABNORMAL HIGH (ref 96–112)
Creatinine, Ser: 0.98 mg/dL (ref 0.4–1.5)
GFR calc Af Amer: >60 mL/min (ref >60)
GFR calc non Af Amer: >60 mL/min (ref >60)
Glucose, Bld: 168 mg/dL — ABNORMAL HIGH (ref 70–99)
Potassium: 4.5 mEq/L (ref 3.5–5.1)
Sodium: 139 mEq/L (ref 135–145)
Total Bilirubin: 0.3 mg/dL (ref 0.3–1.2)
Total Protein: 5.3 g/dL — ABNORMAL LOW (ref 6.0–8.3)

## 2010-05-14 LAB — HEPATIC FUNCTION PANEL
ALT: 16 U/L (ref 0–53)
AST: 25 U/L (ref 0–37)
Albumin: 3 g/dL — ABNORMAL LOW (ref 3.5–5.2)
Alkaline Phosphatase: 55 U/L (ref 39–117)
Bilirubin, Direct: <0.1 mg/dL (ref 0.0–0.3)
Total Bilirubin: 0.5 mg/dL (ref 0.3–1.2)
Total Protein: 5.7 g/dL — ABNORMAL LOW (ref 6.0–8.3)

## 2010-05-14 LAB — LIPASE, BLOOD: Lipase: 25 U/L (ref 11–59)

## 2010-05-14 LAB — DIFFERENTIAL
Basophils Absolute: 0.1 10*3/uL (ref 0.0–0.1)
Basophils Absolute: 0.1 10*3/uL (ref 0.0–0.1)
Basophils Absolute: 0.1 10*3/uL (ref 0.0–0.1)
Basophils Absolute: 0.1 10*3/uL (ref 0.0–0.1)
Basophils Relative: 1 % (ref 0–1)
Basophils Relative: 1 % (ref 0–1)
Basophils Relative: 1 % (ref 0–1)
Basophils Relative: 1 % (ref 0–1)
Eosinophils Absolute: 0.1 10*3/uL (ref 0.0–0.7)
Eosinophils Absolute: 0.1 10*3/uL (ref 0.0–0.7)
Eosinophils Absolute: 0.4 10*3/uL (ref 0.0–0.7)
Eosinophils Absolute: 0.4 10*3/uL (ref 0.0–0.7)
Eosinophils Relative: 1 % (ref 0–5)
Eosinophils Relative: 1 % (ref 0–5)
Eosinophils Relative: 4 % (ref 0–5)
Eosinophils Relative: 5 % (ref 0–5)
Lymphocytes Relative: 11 % — ABNORMAL LOW (ref 12–46)
Lymphocytes Relative: 13 % (ref 12–46)
Lymphocytes Relative: 14 % (ref 12–46)
Lymphocytes Relative: 22 % (ref 12–46)
Lymphs Abs: 1.1 10*3/uL (ref 0.7–4.0)
Lymphs Abs: 1.2 10*3/uL (ref 0.7–4.0)
Lymphs Abs: 1.5 10*3/uL (ref 0.7–4.0)
Lymphs Abs: 1.7 10*3/uL (ref 0.7–4.0)
Monocytes Absolute: 0.9 10*3/uL (ref 0.1–1.0)
Monocytes Absolute: 0.9 10*3/uL (ref 0.1–1.0)
Monocytes Absolute: 1.1 10*3/uL — ABNORMAL HIGH (ref 0.1–1.0)
Monocytes Absolute: 1.2 10*3/uL — ABNORMAL HIGH (ref 0.1–1.0)
Monocytes Relative: 10 % (ref 3–12)
Monocytes Relative: 10 % (ref 3–12)
Monocytes Relative: 11 % (ref 3–12)
Monocytes Relative: 12 % (ref 3–12)
Neutro Abs: 5 10*3/uL (ref 1.7–7.7)
Neutro Abs: 5.7 10*3/uL (ref 1.7–7.7)
Neutro Abs: 7.7 10*3/uL (ref 1.7–7.7)
Neutro Abs: 9.3 10*3/uL — ABNORMAL HIGH (ref 1.7–7.7)
Neutrophils Relative %: 64 % (ref 43–77)
Neutrophils Relative %: 69 % (ref 43–77)
Neutrophils Relative %: 74 % (ref 43–77)
Neutrophils Relative %: 76 % (ref 43–77)

## 2010-05-14 LAB — CK TOTAL AND CKMB (NOT AT ARMC)
CK, MB: 2.1 ng/mL (ref 0.3–4.0)
Relative Index: INVALID (ref 0.0–2.5)
Total CK: 50 U/L (ref 7–232)

## 2010-05-14 LAB — HEMOGLOBIN AND HEMATOCRIT, BLOOD
HCT: 29.6 % — ABNORMAL LOW (ref 39.0–52.0)
HCT: 30.5 % — ABNORMAL LOW (ref 39.0–52.0)
Hemoglobin: 10 g/dL — ABNORMAL LOW (ref 13.0–17.0)
Hemoglobin: 10.3 g/dL — ABNORMAL LOW (ref 13.0–17.0)

## 2010-05-14 LAB — FERRITIN: Ferritin: 32 ng/mL (ref 22–322)

## 2010-05-14 LAB — TROPONIN I: Troponin I: 0.01 ng/mL (ref 0.00–0.06)

## 2010-05-14 LAB — POCT I-STAT, CHEM 8
BUN: 43 mg/dL — ABNORMAL HIGH (ref 6–23)
Calcium, Ion: 1.12 mmol/L (ref 1.12–1.32)
Chloride: 111 mEq/L (ref 96–112)
Creatinine, Ser: 0.7 mg/dL (ref 0.4–1.5)
Glucose, Bld: 99 mg/dL (ref 70–99)
HCT: 29 % — ABNORMAL LOW (ref 39.0–52.0)
Hemoglobin: 9.9 g/dL — ABNORMAL LOW (ref 13.0–17.0)
Potassium: 4.1 mEq/L (ref 3.5–5.1)
Sodium: 140 mEq/L (ref 135–145)
TCO2: 19 mmol/L (ref 0–100)

## 2010-05-14 LAB — POCT CARDIAC MARKERS
CKMB, poc: <1 ng/mL — ABNORMAL LOW (ref 1.0–8.0)
Myoglobin, poc: 40.7 ng/mL (ref 12–200)
Troponin i, poc: <0.05 ng/mL (ref 0.00–0.09)

## 2010-05-14 LAB — IRON AND TIBC
Iron: 107 ug/dL (ref 42–135)
Saturation Ratios: 42 % (ref 20–55)
TIBC: 253 ug/dL (ref 215–435)
UIBC: 146 ug/dL

## 2010-05-14 LAB — RETICULOCYTES
RBC.: 3.16 MIL/uL — ABNORMAL LOW (ref 4.22–5.81)
Retic Count, Absolute: 22.1 10*3/uL (ref 19.0–186.0)
Retic Ct Pct: 0.7 % (ref 0.4–3.1)

## 2010-05-14 LAB — MRSA PCR SCREENING: MRSA by PCR: NEGATIVE

## 2010-05-14 LAB — FOLATE: Folate: 13.6 ng/mL

## 2010-05-14 LAB — MAGNESIUM
Magnesium: 1.6 mg/dL (ref 1.5–2.5)
Magnesium: 2 mg/dL (ref 1.5–2.5)

## 2010-05-14 LAB — PREPARE RBC (CROSSMATCH)

## 2010-05-14 LAB — TSH: TSH: 1.078 u[IU]/mL (ref 0.350–4.500)

## 2010-05-14 LAB — VITAMIN B12: Vitamin B-12: 366 pg/mL (ref 211–911)

## 2010-05-14 LAB — PHOSPHORUS: Phosphorus: 3 mg/dL (ref 2.3–4.6)

## 2010-05-15 LAB — BASIC METABOLIC PANEL
BUN: 10 mg/dL (ref 6–23)
BUN: 7 mg/dL (ref 6–23)
BUN: 7 mg/dL (ref 6–23)
BUN: 9 mg/dL (ref 6–23)
CO2: 20 mEq/L (ref 19–32)
CO2: 21 mEq/L (ref 19–32)
CO2: 22 mEq/L (ref 19–32)
CO2: 24 mEq/L (ref 19–32)
Calcium: 7.3 mg/dL — ABNORMAL LOW (ref 8.4–10.5)
Calcium: 7.8 mg/dL — ABNORMAL LOW (ref 8.4–10.5)
Calcium: 8.1 mg/dL — ABNORMAL LOW (ref 8.4–10.5)
Calcium: 8.2 mg/dL — ABNORMAL LOW (ref 8.4–10.5)
Chloride: 111 mEq/L (ref 96–112)
Chloride: 111 mEq/L (ref 96–112)
Chloride: 112 mEq/L (ref 96–112)
Chloride: 112 mEq/L (ref 96–112)
Creatinine, Ser: 0.79 mg/dL (ref 0.4–1.5)
Creatinine, Ser: 0.88 mg/dL (ref 0.4–1.5)
Creatinine, Ser: 0.92 mg/dL (ref 0.4–1.5)
Creatinine, Ser: 0.96 mg/dL (ref 0.4–1.5)
GFR calc Af Amer: >60 mL/min (ref >60)
GFR calc Af Amer: >60 mL/min (ref >60)
GFR calc Af Amer: >60 mL/min (ref >60)
GFR calc Af Amer: >60 mL/min (ref >60)
GFR calc non Af Amer: >60 mL/min (ref >60)
GFR calc non Af Amer: >60 mL/min (ref >60)
GFR calc non Af Amer: >60 mL/min (ref >60)
GFR calc non Af Amer: >60 mL/min (ref >60)
Glucose, Bld: 89 mg/dL (ref 70–99)
Glucose, Bld: 91 mg/dL (ref 70–99)
Glucose, Bld: 92 mg/dL (ref 70–99)
Glucose, Bld: 96 mg/dL (ref 70–99)
Potassium: 3.5 mEq/L (ref 3.5–5.1)
Potassium: 3.7 mEq/L (ref 3.5–5.1)
Potassium: 3.7 mEq/L (ref 3.5–5.1)
Potassium: 3.8 mEq/L (ref 3.5–5.1)
Sodium: 136 mEq/L (ref 135–145)
Sodium: 137 mEq/L (ref 135–145)
Sodium: 138 mEq/L (ref 135–145)
Sodium: 140 mEq/L (ref 135–145)

## 2010-05-15 LAB — DIFFERENTIAL
Basophils Absolute: 0 10*3/uL (ref 0.0–0.1)
Basophils Relative: 1 % (ref 0–1)
Eosinophils Absolute: 0.3 10*3/uL (ref 0.0–0.7)
Eosinophils Relative: 4 % (ref 0–5)
Lymphocytes Relative: 14 % (ref 12–46)
Lymphs Abs: 1 10*3/uL (ref 0.7–4.0)
Monocytes Absolute: 0.7 10*3/uL (ref 0.1–1.0)
Monocytes Relative: 10 % (ref 3–12)
Neutro Abs: 5.1 10*3/uL (ref 1.7–7.7)
Neutrophils Relative %: 71 % (ref 43–77)

## 2010-05-15 LAB — URINE CULTURE
Colony Count: NO GROWTH
Culture: NO GROWTH
Special Requests: NEGATIVE

## 2010-05-15 LAB — CBC
HCT: 28.5 % — ABNORMAL LOW (ref 39.0–52.0)
HCT: 29 % — ABNORMAL LOW (ref 39.0–52.0)
HCT: 29.8 % — ABNORMAL LOW (ref 39.0–52.0)
HCT: 30.1 % — ABNORMAL LOW (ref 39.0–52.0)
HCT: 33.6 % — ABNORMAL LOW (ref 39.0–52.0)
Hemoglobin: 10 g/dL — ABNORMAL LOW (ref 13.0–17.0)
Hemoglobin: 10.3 g/dL — ABNORMAL LOW (ref 13.0–17.0)
Hemoglobin: 11.3 g/dL — ABNORMAL LOW (ref 13.0–17.0)
Hemoglobin: 9.7 g/dL — ABNORMAL LOW (ref 13.0–17.0)
Hemoglobin: 9.8 g/dL — ABNORMAL LOW (ref 13.0–17.0)
MCH: 30.8 pg (ref 26.0–34.0)
MCH: 30.9 pg (ref 26.0–34.0)
MCH: 31 pg (ref 26.0–34.0)
MCH: 31.3 pg (ref 26.0–34.0)
MCH: 31.5 pg (ref 26.0–34.0)
MCHC: 33.7 g/dL (ref 30.0–36.0)
MCHC: 33.7 g/dL (ref 30.0–36.0)
MCHC: 33.7 g/dL (ref 30.0–36.0)
MCHC: 34.1 g/dL (ref 30.0–36.0)
MCHC: 34.2 g/dL (ref 30.0–36.0)
MCV: 90.2 fL (ref 78.0–100.0)
MCV: 91.4 fL (ref 78.0–100.0)
MCV: 91.9 fL (ref 78.0–100.0)
MCV: 92.3 fL (ref 78.0–100.0)
MCV: 92.9 fL (ref 78.0–100.0)
Platelets: 151 10*3/uL (ref 150–400)
Platelets: 155 10*3/uL (ref 150–400)
Platelets: 170 10*3/uL (ref 150–400)
Platelets: 184 10*3/uL (ref 150–400)
Platelets: 198 10*3/uL (ref 150–400)
RBC: 3.09 MIL/uL — ABNORMAL LOW (ref 4.22–5.81)
RBC: 3.13 MIL/uL — ABNORMAL LOW (ref 4.22–5.81)
RBC: 3.24 MIL/uL — ABNORMAL LOW (ref 4.22–5.81)
RBC: 3.34 MIL/uL — ABNORMAL LOW (ref 4.22–5.81)
RBC: 3.68 MIL/uL — ABNORMAL LOW (ref 4.22–5.81)
RDW: 18.1 % — ABNORMAL HIGH (ref 11.5–15.5)
RDW: 18.2 % — ABNORMAL HIGH (ref 11.5–15.5)
RDW: 18.2 % — ABNORMAL HIGH (ref 11.5–15.5)
RDW: 18.4 % — ABNORMAL HIGH (ref 11.5–15.5)
RDW: 18.4 % — ABNORMAL HIGH (ref 11.5–15.5)
WBC: 12.6 10*3/uL — ABNORMAL HIGH (ref 4.0–10.5)
WBC: 6.8 10*3/uL (ref 4.0–10.5)
WBC: 7.1 10*3/uL (ref 4.0–10.5)
WBC: 7.3 10*3/uL (ref 4.0–10.5)
WBC: 9.5 10*3/uL (ref 4.0–10.5)

## 2010-05-15 LAB — URINALYSIS, ROUTINE W REFLEX MICROSCOPIC
Bilirubin Urine: NEGATIVE
Glucose, UA: NEGATIVE mg/dL
Hgb urine dipstick: NEGATIVE
Nitrite: NEGATIVE
Protein, ur: NEGATIVE mg/dL
Specific Gravity, Urine: 1.02 (ref 1.005–1.030)
Urobilinogen, UA: 1 mg/dL (ref 0.0–1.0)
pH: 8 (ref 5.0–8.0)

## 2010-05-15 LAB — D-DIMER, QUANTITATIVE: D-Dimer, Quant: 0.7 ug/mL-FEU — ABNORMAL HIGH (ref 0.00–0.48)

## 2010-05-15 LAB — CULTURE, BLOOD (ROUTINE X 2)
Culture: NO GROWTH
Culture: NO GROWTH

## 2010-05-15 LAB — COMPREHENSIVE METABOLIC PANEL
ALT: 13 U/L (ref 0–53)
AST: 24 U/L (ref 0–37)
Albumin: 3.3 g/dL — ABNORMAL LOW (ref 3.5–5.2)
Alkaline Phosphatase: 67 U/L (ref 39–117)
BUN: 9 mg/dL (ref 6–23)
CO2: 18 mEq/L — ABNORMAL LOW (ref 19–32)
Calcium: 8.5 mg/dL (ref 8.4–10.5)
Chloride: 103 mEq/L (ref 96–112)
Creatinine, Ser: 1.04 mg/dL (ref 0.4–1.5)
GFR calc Af Amer: >60 mL/min (ref >60)
GFR calc non Af Amer: >60 mL/min (ref >60)
Glucose, Bld: 94 mg/dL (ref 70–99)
Potassium: 3.7 mEq/L (ref 3.5–5.1)
Sodium: 134 mEq/L — ABNORMAL LOW (ref 135–145)
Total Bilirubin: 0.9 mg/dL (ref 0.3–1.2)
Total Protein: 6.6 g/dL (ref 6.0–8.3)

## 2010-05-15 LAB — PROCALCITONIN: Procalcitonin: <0.5 ng/mL

## 2010-05-15 LAB — EXPECTORATED SPUTUM ASSESSMENT W REFEX TO RESP CULTURE

## 2010-05-15 LAB — HEMOCCULT GUIAC POC 1CARD (OFFICE): Fecal Occult Bld: NEGATIVE

## 2010-05-15 LAB — EXPECTORATED SPUTUM ASSESSMENT W GRAM STAIN, RFLX TO RESP C

## 2010-05-15 LAB — MRSA PCR SCREENING: MRSA by PCR: NEGATIVE

## 2010-05-15 LAB — LACTIC ACID, PLASMA: Lactic Acid, Venous: 1.2 mmol/L (ref 0.5–2.2)

## 2010-05-15 LAB — TSH: TSH: 1.484 u[IU]/mL (ref 0.350–4.500)

## 2010-05-16 LAB — CBC
HCT: 22.6 % — ABNORMAL LOW (ref 39.0–52.0)
HCT: 22.9 % — ABNORMAL LOW (ref 39.0–52.0)
HCT: 22.9 % — ABNORMAL LOW (ref 39.0–52.0)
HCT: 23.6 % — ABNORMAL LOW (ref 39.0–52.0)
HCT: 27.7 % — ABNORMAL LOW (ref 39.0–52.0)
Hemoglobin: 7.8 g/dL — ABNORMAL LOW (ref 13.0–17.0)
Hemoglobin: 7.9 g/dL — ABNORMAL LOW (ref 13.0–17.0)
Hemoglobin: 8 g/dL — ABNORMAL LOW (ref 13.0–17.0)
Hemoglobin: 8 g/dL — ABNORMAL LOW (ref 13.0–17.0)
Hemoglobin: 9.6 g/dL — ABNORMAL LOW (ref 13.0–17.0)
MCHC: 33.8 g/dL (ref 30.0–36.0)
MCHC: 34.4 g/dL (ref 30.0–36.0)
MCHC: 34.7 g/dL (ref 30.0–36.0)
MCHC: 34.8 g/dL (ref 30.0–36.0)
MCHC: 34.8 g/dL (ref 30.0–36.0)
MCV: 93.1 fL (ref 78.0–100.0)
MCV: 93.1 fL (ref 78.0–100.0)
MCV: 94.1 fL (ref 78.0–100.0)
MCV: 94.7 fL (ref 78.0–100.0)
MCV: 94.8 fL (ref 78.0–100.0)
Platelets: 137 10*3/uL — ABNORMAL LOW (ref 150–400)
Platelets: 145 10*3/uL — ABNORMAL LOW (ref 150–400)
Platelets: 153 10*3/uL (ref 150–400)
Platelets: 189 10*3/uL (ref 150–400)
Platelets: 200 10*3/uL (ref 150–400)
RBC: 2.39 MIL/uL — ABNORMAL LOW (ref 4.22–5.81)
RBC: 2.43 MIL/uL — ABNORMAL LOW (ref 4.22–5.81)
RBC: 2.47 MIL/uL — ABNORMAL LOW (ref 4.22–5.81)
RBC: 2.49 MIL/uL — ABNORMAL LOW (ref 4.22–5.81)
RBC: 2.97 MIL/uL — ABNORMAL LOW (ref 4.22–5.81)
RDW: 17.6 % — ABNORMAL HIGH (ref 11.5–15.5)
RDW: 17.7 % — ABNORMAL HIGH (ref 11.5–15.5)
RDW: 17.7 % — ABNORMAL HIGH (ref 11.5–15.5)
RDW: 18.4 % — ABNORMAL HIGH (ref 11.5–15.5)
RDW: 19.1 % — ABNORMAL HIGH (ref 11.5–15.5)
WBC: 12.7 10*3/uL — ABNORMAL HIGH (ref 4.0–10.5)
WBC: 12.9 10*3/uL — ABNORMAL HIGH (ref 4.0–10.5)
WBC: 8.5 10*3/uL (ref 4.0–10.5)
WBC: 8.9 10*3/uL (ref 4.0–10.5)
WBC: 9.7 10*3/uL (ref 4.0–10.5)

## 2010-05-16 LAB — BASIC METABOLIC PANEL
BUN: 10 mg/dL (ref 6–23)
BUN: 14 mg/dL (ref 6–23)
CO2: 24 mEq/L (ref 19–32)
CO2: 26 mEq/L (ref 19–32)
Calcium: 7.4 mg/dL — ABNORMAL LOW (ref 8.4–10.5)
Calcium: 7.7 mg/dL — ABNORMAL LOW (ref 8.4–10.5)
Chloride: 106 mEq/L (ref 96–112)
Chloride: 108 mEq/L (ref 96–112)
Creatinine, Ser: 0.87 mg/dL (ref 0.4–1.5)
Creatinine, Ser: 0.97 mg/dL (ref 0.4–1.5)
GFR calc Af Amer: >60 mL/min (ref >60)
GFR calc Af Amer: >60 mL/min (ref >60)
GFR calc non Af Amer: >60 mL/min (ref >60)
GFR calc non Af Amer: >60 mL/min (ref >60)
Glucose, Bld: 95 mg/dL (ref 70–99)
Glucose, Bld: 98 mg/dL (ref 70–99)
Potassium: 3.3 mEq/L — ABNORMAL LOW (ref 3.5–5.1)
Potassium: 3.6 mEq/L (ref 3.5–5.1)
Sodium: 135 mEq/L (ref 135–145)
Sodium: 137 mEq/L (ref 135–145)

## 2010-05-16 LAB — PREPARE RBC (CROSSMATCH)

## 2010-05-16 LAB — HEMOGLOBIN: Hemoglobin: 8.8 g/dL — ABNORMAL LOW (ref 13.0–17.0)

## 2010-05-17 LAB — DIFFERENTIAL
Basophils Absolute: 0 10*3/uL (ref 0.0–0.1)
Basophils Absolute: 0 10*3/uL (ref 0.0–0.1)
Basophils Absolute: 0.1 10*3/uL (ref 0.0–0.1)
Basophils Relative: 0 % (ref 0–1)
Basophils Relative: 1 % (ref 0–1)
Basophils Relative: 1 % (ref 0–1)
Eosinophils Absolute: 0 10*3/uL (ref 0.0–0.7)
Eosinophils Absolute: 0.1 10*3/uL (ref 0.0–0.7)
Eosinophils Absolute: 0.2 10*3/uL (ref 0.0–0.7)
Eosinophils Relative: 0 % (ref 0–5)
Eosinophils Relative: 1 % (ref 0–5)
Eosinophils Relative: 3 % (ref 0–5)
Lymphocytes Relative: 11 % — ABNORMAL LOW (ref 12–46)
Lymphocytes Relative: 29 % (ref 12–46)
Lymphocytes Relative: 5 % — ABNORMAL LOW (ref 12–46)
Lymphs Abs: 0.9 10*3/uL (ref 0.7–4.0)
Lymphs Abs: 0.9 10*3/uL (ref 0.7–4.0)
Lymphs Abs: 1.9 10*3/uL (ref 0.7–4.0)
Monocytes Absolute: 0.7 10*3/uL (ref 0.1–1.0)
Monocytes Absolute: 0.8 10*3/uL (ref 0.1–1.0)
Monocytes Absolute: 1.3 10*3/uL — ABNORMAL HIGH (ref 0.1–1.0)
Monocytes Relative: 11 % (ref 3–12)
Monocytes Relative: 7 % (ref 3–12)
Monocytes Relative: 9 % (ref 3–12)
Neutro Abs: 15.1 10*3/uL — ABNORMAL HIGH (ref 1.7–7.7)
Neutro Abs: 3.8 10*3/uL (ref 1.7–7.7)
Neutro Abs: 6.8 10*3/uL (ref 1.7–7.7)
Neutrophils Relative %: 56 % (ref 43–77)
Neutrophils Relative %: 79 % — ABNORMAL HIGH (ref 43–77)
Neutrophils Relative %: 87 % — ABNORMAL HIGH (ref 43–77)

## 2010-05-17 LAB — CARDIAC PANEL(CRET KIN+CKTOT+MB+TROPI)
CK, MB: 1.3 ng/mL (ref 0.3–4.0)
CK, MB: 1.4 ng/mL (ref 0.3–4.0)
CK, MB: 1.5 ng/mL (ref 0.3–4.0)
Relative Index: INVALID (ref 0.0–2.5)
Relative Index: INVALID (ref 0.0–2.5)
Relative Index: INVALID (ref 0.0–2.5)
Total CK: 54 U/L (ref 7–232)
Total CK: 66 U/L (ref 7–232)
Total CK: 68 U/L (ref 7–232)
Troponin I: 0.01 ng/mL (ref 0.00–0.06)
Troponin I: <0.01 ng/mL (ref 0.00–0.06)
Troponin I: <0.01 ng/mL (ref 0.00–0.06)

## 2010-05-17 LAB — CBC
HCT: 20.9 % — ABNORMAL LOW (ref 39.0–52.0)
HCT: 21.5 % — ABNORMAL LOW (ref 39.0–52.0)
HCT: 21.7 % — ABNORMAL LOW (ref 39.0–52.0)
HCT: 23.3 % — ABNORMAL LOW (ref 39.0–52.0)
HCT: 25 % — ABNORMAL LOW (ref 39.0–52.0)
HCT: 25.3 % — ABNORMAL LOW (ref 39.0–52.0)
HCT: 25.4 % — ABNORMAL LOW (ref 39.0–52.0)
HCT: 26.4 % — ABNORMAL LOW (ref 39.0–52.0)
HCT: 26.5 % — ABNORMAL LOW (ref 39.0–52.0)
HCT: 27 % — ABNORMAL LOW (ref 39.0–52.0)
HCT: 29.9 % — ABNORMAL LOW (ref 39.0–52.0)
HCT: 30.2 % — ABNORMAL LOW (ref 39.0–52.0)
Hemoglobin: 10.1 g/dL — ABNORMAL LOW (ref 13.0–17.0)
Hemoglobin: 10.3 g/dL — ABNORMAL LOW (ref 13.0–17.0)
Hemoglobin: 7.1 g/dL — ABNORMAL LOW (ref 13.0–17.0)
Hemoglobin: 7.5 g/dL — ABNORMAL LOW (ref 13.0–17.0)
Hemoglobin: 7.6 g/dL — ABNORMAL LOW (ref 13.0–17.0)
Hemoglobin: 8.1 g/dL — ABNORMAL LOW (ref 13.0–17.0)
Hemoglobin: 8.6 g/dL — ABNORMAL LOW (ref 13.0–17.0)
Hemoglobin: 8.6 g/dL — ABNORMAL LOW (ref 13.0–17.0)
Hemoglobin: 8.8 g/dL — ABNORMAL LOW (ref 13.0–17.0)
Hemoglobin: 9 g/dL — ABNORMAL LOW (ref 13.0–17.0)
Hemoglobin: 9.2 g/dL — ABNORMAL LOW (ref 13.0–17.0)
Hemoglobin: 9.2 g/dL — ABNORMAL LOW (ref 13.0–17.0)
MCHC: 34 g/dL (ref 30.0–36.0)
MCHC: 34 g/dL (ref 30.0–36.0)
MCHC: 34.1 g/dL (ref 30.0–36.0)
MCHC: 34.1 g/dL (ref 30.0–36.0)
MCHC: 34.1 g/dL (ref 30.0–36.0)
MCHC: 34.2 g/dL (ref 30.0–36.0)
MCHC: 34.3 g/dL (ref 30.0–36.0)
MCHC: 34.5 g/dL (ref 30.0–36.0)
MCHC: 34.5 g/dL (ref 30.0–36.0)
MCHC: 34.6 g/dL (ref 30.0–36.0)
MCHC: 34.8 g/dL (ref 30.0–36.0)
MCHC: 35.4 g/dL (ref 30.0–36.0)
MCV: 92 fL (ref 78.0–100.0)
MCV: 94.1 fL (ref 78.0–100.0)
MCV: 94.6 fL (ref 78.0–100.0)
MCV: 94.7 fL (ref 78.0–100.0)
MCV: 94.9 fL (ref 78.0–100.0)
MCV: 95.4 fL (ref 78.0–100.0)
MCV: 95.9 fL (ref 78.0–100.0)
MCV: 95.9 fL (ref 78.0–100.0)
MCV: 96 fL (ref 78.0–100.0)
MCV: 96.4 fL (ref 78.0–100.0)
MCV: 96.7 fL (ref 78.0–100.0)
MCV: 96.8 fL (ref 78.0–100.0)
Platelets: 141 10*3/uL — ABNORMAL LOW (ref 150–400)
Platelets: 144 10*3/uL — ABNORMAL LOW (ref 150–400)
Platelets: 154 10*3/uL (ref 150–400)
Platelets: 159 10*3/uL (ref 150–400)
Platelets: 159 10*3/uL (ref 150–400)
Platelets: 162 10*3/uL (ref 150–400)
Platelets: 166 10*3/uL (ref 150–400)
Platelets: 170 10*3/uL (ref 150–400)
Platelets: 173 10*3/uL (ref 150–400)
Platelets: 173 10*3/uL (ref 150–400)
Platelets: 174 10*3/uL (ref 150–400)
Platelets: 188 10*3/uL (ref 150–400)
RBC: 2.18 MIL/uL — ABNORMAL LOW (ref 4.22–5.81)
RBC: 2.29 MIL/uL — ABNORMAL LOW (ref 4.22–5.81)
RBC: 2.34 MIL/uL — ABNORMAL LOW (ref 4.22–5.81)
RBC: 2.46 MIL/uL — ABNORMAL LOW (ref 4.22–5.81)
RBC: 2.61 MIL/uL — ABNORMAL LOW (ref 4.22–5.81)
RBC: 2.63 MIL/uL — ABNORMAL LOW (ref 4.22–5.81)
RBC: 2.65 MIL/uL — ABNORMAL LOW (ref 4.22–5.81)
RBC: 2.75 MIL/uL — ABNORMAL LOW (ref 4.22–5.81)
RBC: 2.81 MIL/uL — ABNORMAL LOW (ref 4.22–5.81)
RBC: 2.85 MIL/uL — ABNORMAL LOW (ref 4.22–5.81)
RBC: 3.08 MIL/uL — ABNORMAL LOW (ref 4.22–5.81)
RBC: 3.15 MIL/uL — ABNORMAL LOW (ref 4.22–5.81)
RDW: 14.3 % (ref 11.5–15.5)
RDW: 14.3 % (ref 11.5–15.5)
RDW: 14.4 % (ref 11.5–15.5)
RDW: 14.5 % (ref 11.5–15.5)
RDW: 14.6 % (ref 11.5–15.5)
RDW: 14.6 % (ref 11.5–15.5)
RDW: 14.7 % (ref 11.5–15.5)
RDW: 14.8 % (ref 11.5–15.5)
RDW: 17.8 % — ABNORMAL HIGH (ref 11.5–15.5)
RDW: 18.5 % — ABNORMAL HIGH (ref 11.5–15.5)
RDW: 18.5 % — ABNORMAL HIGH (ref 11.5–15.5)
RDW: 18.6 % — ABNORMAL HIGH (ref 11.5–15.5)
WBC: 13.8 10*3/uL — ABNORMAL HIGH (ref 4.0–10.5)
WBC: 17.3 10*3/uL — ABNORMAL HIGH (ref 4.0–10.5)
WBC: 17.6 10*3/uL — ABNORMAL HIGH (ref 4.0–10.5)
WBC: 17.9 10*3/uL — ABNORMAL HIGH (ref 4.0–10.5)
WBC: 19.1 10*3/uL — ABNORMAL HIGH (ref 4.0–10.5)
WBC: 5.7 10*3/uL (ref 4.0–10.5)
WBC: 6.2 10*3/uL (ref 4.0–10.5)
WBC: 6.7 10*3/uL (ref 4.0–10.5)
WBC: 7.6 10*3/uL (ref 4.0–10.5)
WBC: 8.5 10*3/uL (ref 4.0–10.5)
WBC: 8.8 10*3/uL (ref 4.0–10.5)
WBC: 8.8 10*3/uL (ref 4.0–10.5)

## 2010-05-17 LAB — COMPREHENSIVE METABOLIC PANEL
ALT: 12 U/L (ref 0–53)
ALT: 12 U/L (ref 0–53)
AST: 18 U/L (ref 0–37)
AST: 20 U/L (ref 0–37)
Albumin: 2.2 g/dL — ABNORMAL LOW (ref 3.5–5.2)
Albumin: 2.7 g/dL — ABNORMAL LOW (ref 3.5–5.2)
Alkaline Phosphatase: 48 U/L (ref 39–117)
Alkaline Phosphatase: 51 U/L (ref 39–117)
BUN: 25 mg/dL — ABNORMAL HIGH (ref 6–23)
BUN: 33 mg/dL — ABNORMAL HIGH (ref 6–23)
CO2: 19 mEq/L (ref 19–32)
CO2: 25 mEq/L (ref 19–32)
Calcium: 6.7 mg/dL — ABNORMAL LOW (ref 8.4–10.5)
Calcium: 8.2 mg/dL — ABNORMAL LOW (ref 8.4–10.5)
Chloride: 111 mEq/L (ref 96–112)
Chloride: 113 mEq/L — ABNORMAL HIGH (ref 96–112)
Creatinine, Ser: 0.89 mg/dL (ref 0.4–1.5)
Creatinine, Ser: 1.01 mg/dL (ref 0.4–1.5)
GFR calc Af Amer: >60 mL/min (ref >60)
GFR calc Af Amer: >60 mL/min (ref >60)
GFR calc non Af Amer: >60 mL/min (ref >60)
GFR calc non Af Amer: >60 mL/min (ref >60)
Glucose, Bld: 148 mg/dL — ABNORMAL HIGH (ref 70–99)
Glucose, Bld: 83 mg/dL (ref 70–99)
Potassium: 3.8 mEq/L (ref 3.5–5.1)
Potassium: 3.9 mEq/L (ref 3.5–5.1)
Sodium: 136 mEq/L (ref 135–145)
Sodium: 141 mEq/L (ref 135–145)
Total Bilirubin: 0.3 mg/dL (ref 0.3–1.2)
Total Bilirubin: 0.5 mg/dL (ref 0.3–1.2)
Total Protein: 4.3 g/dL — ABNORMAL LOW (ref 6.0–8.3)
Total Protein: 5.2 g/dL — ABNORMAL LOW (ref 6.0–8.3)

## 2010-05-17 LAB — LIPID PANEL
Cholesterol: 102 mg/dL (ref 0–200)
HDL: 33 mg/dL — ABNORMAL LOW (ref >39)
LDL Cholesterol: 53 mg/dL (ref 0–99)
Total CHOL/HDL Ratio: 3.1 RATIO
Triglycerides: 80 mg/dL (ref <150)
VLDL: 16 mg/dL (ref 0–40)

## 2010-05-17 LAB — URINALYSIS, ROUTINE W REFLEX MICROSCOPIC
Bilirubin Urine: NEGATIVE
Glucose, UA: NEGATIVE mg/dL
Hgb urine dipstick: NEGATIVE
Ketones, ur: NEGATIVE mg/dL
Nitrite: NEGATIVE
Protein, ur: NEGATIVE mg/dL
Specific Gravity, Urine: 1.028 (ref 1.005–1.030)
Urobilinogen, UA: 1 mg/dL (ref 0.0–1.0)
pH: 6 (ref 5.0–8.0)

## 2010-05-17 LAB — CROSSMATCH
ABO/RH(D): O POS
Antibody Screen: NEGATIVE

## 2010-05-17 LAB — BASIC METABOLIC PANEL
BUN: 20 mg/dL (ref 6–23)
BUN: 26 mg/dL — ABNORMAL HIGH (ref 6–23)
BUN: 27 mg/dL — ABNORMAL HIGH (ref 6–23)
CO2: 22 mEq/L (ref 19–32)
CO2: 24 mEq/L (ref 19–32)
CO2: 28 mEq/L (ref 19–32)
Calcium: 7.8 mg/dL — ABNORMAL LOW (ref 8.4–10.5)
Calcium: 8.2 mg/dL — ABNORMAL LOW (ref 8.4–10.5)
Calcium: 8.5 mg/dL (ref 8.4–10.5)
Chloride: 106 mEq/L (ref 96–112)
Chloride: 110 mEq/L (ref 96–112)
Chloride: 112 mEq/L (ref 96–112)
Creatinine, Ser: 0.92 mg/dL (ref 0.4–1.5)
Creatinine, Ser: 0.97 mg/dL (ref 0.4–1.5)
Creatinine, Ser: 0.99 mg/dL (ref 0.4–1.5)
GFR calc Af Amer: >60 mL/min (ref >60)
GFR calc Af Amer: >60 mL/min (ref >60)
GFR calc Af Amer: >60 mL/min (ref >60)
GFR calc non Af Amer: >60 mL/min (ref >60)
GFR calc non Af Amer: >60 mL/min (ref >60)
GFR calc non Af Amer: >60 mL/min (ref >60)
Glucose, Bld: 106 mg/dL — ABNORMAL HIGH (ref 70–99)
Glucose, Bld: 121 mg/dL — ABNORMAL HIGH (ref 70–99)
Glucose, Bld: 99 mg/dL (ref 70–99)
Potassium: 3.8 mEq/L (ref 3.5–5.1)
Potassium: 4 mEq/L (ref 3.5–5.1)
Potassium: 4.2 mEq/L (ref 3.5–5.1)
Sodium: 137 mEq/L (ref 135–145)
Sodium: 137 mEq/L (ref 135–145)
Sodium: 139 mEq/L (ref 135–145)

## 2010-05-17 LAB — PROTIME-INR
INR: 1.02 (ref 0.00–1.49)
INR: 1.26 (ref 0.00–1.49)
Prothrombin Time: 13.3 seconds (ref 11.6–15.2)
Prothrombin Time: 15.7 seconds — ABNORMAL HIGH (ref 11.6–15.2)

## 2010-05-17 LAB — TYPE AND SCREEN
ABO/RH(D): O POS
Antibody Screen: NEGATIVE

## 2010-05-17 LAB — TSH: TSH: 1.6 u[IU]/mL (ref 0.350–4.500)

## 2010-05-17 LAB — HEMOCCULT GUIAC POC 1CARD (OFFICE): Fecal Occult Bld: POSITIVE

## 2010-05-17 LAB — URINALYSIS, MICROSCOPIC ONLY
Bilirubin Urine: NEGATIVE
Glucose, UA: NEGATIVE mg/dL
Ketones, ur: NEGATIVE mg/dL
Leukocytes, UA: NEGATIVE
Nitrite: NEGATIVE
Protein, ur: NEGATIVE mg/dL
Specific Gravity, Urine: 1.015 (ref 1.005–1.030)
Urobilinogen, UA: 1 mg/dL (ref 0.0–1.0)
pH: 5 (ref 5.0–8.0)

## 2010-05-17 LAB — LACTIC ACID, PLASMA: Lactic Acid, Venous: 4.4 mmol/L — ABNORMAL HIGH (ref 0.5–2.2)

## 2010-05-17 LAB — POCT CARDIAC MARKERS
CKMB, poc: <1 ng/mL — ABNORMAL LOW (ref 1.0–8.0)
Myoglobin, poc: 53 ng/mL (ref 12–200)
Troponin i, poc: <0.05 ng/mL (ref 0.00–0.09)

## 2010-05-17 LAB — PREPARE RBC (CROSSMATCH)

## 2010-05-17 LAB — APTT: aPTT: 28 seconds (ref 24–37)

## 2010-05-17 LAB — GLUCOSE, CAPILLARY: Glucose-Capillary: 201 mg/dL — ABNORMAL HIGH (ref 70–99)

## 2010-05-17 LAB — ABO/RH: ABO/RH(D): O POS

## 2010-05-17 LAB — CORTISOL: Cortisol, Plasma: 17.6 ug/dL

## 2010-05-17 LAB — MRSA PCR SCREENING: MRSA by PCR: POSITIVE — AB

## 2010-05-24 ENCOUNTER — Encounter: Payer: Self-pay | Admitting: Endocrinology

## 2010-06-21 ENCOUNTER — Emergency Department (HOSPITAL_COMMUNITY): Payer: Medicare Other

## 2010-06-21 ENCOUNTER — Inpatient Hospital Stay (HOSPITAL_COMMUNITY)
Admission: EM | Admit: 2010-06-21 | Discharge: 2010-06-25 | DRG: 195 | Disposition: A | Discharge status: Home Health | Payer: Medicare Other | Attending: Internal Medicine | Admitting: Internal Medicine

## 2010-06-21 DIAGNOSIS — K219 Gastro-esophageal reflux disease without esophagitis: Secondary | ICD-10-CM | POA: Diagnosis present

## 2010-06-21 DIAGNOSIS — J4489 Other specified chronic obstructive pulmonary disease: Secondary | ICD-10-CM | POA: Diagnosis present

## 2010-06-21 DIAGNOSIS — E039 Hypothyroidism, unspecified: Secondary | ICD-10-CM | POA: Diagnosis present

## 2010-06-21 DIAGNOSIS — Z9861 Coronary angioplasty status: Secondary | ICD-10-CM

## 2010-06-21 DIAGNOSIS — D649 Anemia, unspecified: Secondary | ICD-10-CM | POA: Diagnosis present

## 2010-06-21 DIAGNOSIS — I2510 Atherosclerotic heart disease of native coronary artery without angina pectoris: Secondary | ICD-10-CM | POA: Diagnosis present

## 2010-06-21 DIAGNOSIS — Z79899 Other long term (current) drug therapy: Secondary | ICD-10-CM

## 2010-06-21 DIAGNOSIS — D497 Neoplasm of unspecified behavior of endocrine glands and other parts of nervous system: Secondary | ICD-10-CM | POA: Diagnosis present

## 2010-06-21 DIAGNOSIS — N400 Enlarged prostate without lower urinary tract symptoms: Secondary | ICD-10-CM | POA: Diagnosis present

## 2010-06-21 DIAGNOSIS — D72829 Elevated white blood cell count, unspecified: Secondary | ICD-10-CM | POA: Diagnosis present

## 2010-06-21 DIAGNOSIS — T380X5A Adverse effect of glucocorticoids and synthetic analogues, initial encounter: Secondary | ICD-10-CM | POA: Diagnosis present

## 2010-06-21 DIAGNOSIS — J449 Chronic obstructive pulmonary disease, unspecified: Secondary | ICD-10-CM | POA: Diagnosis present

## 2010-06-21 DIAGNOSIS — K5900 Constipation, unspecified: Secondary | ICD-10-CM | POA: Diagnosis present

## 2010-06-21 DIAGNOSIS — Z7982 Long term (current) use of aspirin: Secondary | ICD-10-CM

## 2010-06-21 DIAGNOSIS — J189 Pneumonia, unspecified organism: Principal | ICD-10-CM | POA: Diagnosis present

## 2010-06-21 LAB — URINALYSIS, ROUTINE W REFLEX MICROSCOPIC
Bilirubin Urine: NEGATIVE
Glucose, UA: NEGATIVE mg/dL
Ketones, ur: NEGATIVE mg/dL
Leukocytes, UA: NEGATIVE
Nitrite: NEGATIVE
Protein, ur: NEGATIVE mg/dL
Specific Gravity, Urine: 1.018 (ref 1.005–1.030)
Urobilinogen, UA: 1 mg/dL (ref 0.0–1.0)
pH: 5.5 (ref 5.0–8.0)

## 2010-06-21 LAB — POCT I-STAT, CHEM 8
BUN: 9 mg/dL (ref 6–23)
Calcium, Ion: 1.13 mmol/L (ref 1.12–1.32)
Chloride: 104 mEq/L (ref 96–112)
Creatinine, Ser: 1.1 mg/dL (ref 0.4–1.5)
Glucose, Bld: 103 mg/dL — ABNORMAL HIGH (ref 70–99)
HCT: 37 % — ABNORMAL LOW (ref 39.0–52.0)
Hemoglobin: 12.6 g/dL — ABNORMAL LOW (ref 13.0–17.0)
Potassium: 3.8 mEq/L (ref 3.5–5.1)
Sodium: 139 mEq/L (ref 135–145)
TCO2: 26 mmol/L (ref 0–100)

## 2010-06-21 LAB — URINE MICROSCOPIC-ADD ON

## 2010-06-21 LAB — MRSA PCR SCREENING: MRSA by PCR: NEGATIVE

## 2010-06-22 LAB — CBC
HCT: 33.9 % — ABNORMAL LOW (ref 39.0–52.0)
Hemoglobin: 11.6 g/dL — ABNORMAL LOW (ref 13.0–17.0)
MCH: 32 pg (ref 26.0–34.0)
MCHC: 34.2 g/dL (ref 30.0–36.0)
MCV: 93.4 fL (ref 78.0–100.0)
Platelets: 201 10*3/uL (ref 150–400)
RBC: 3.63 MIL/uL — ABNORMAL LOW (ref 4.22–5.81)
RDW: 13.7 % (ref 11.5–15.5)
WBC: 17.1 10*3/uL — ABNORMAL HIGH (ref 4.0–10.5)

## 2010-06-22 LAB — URINE CULTURE
Colony Count: NO GROWTH
Culture  Setup Time: 201204232030
Culture: NO GROWTH

## 2010-06-22 LAB — LEGIONELLA ANTIGEN, URINE: Legionella Antigen, Urine: NEGATIVE

## 2010-06-22 LAB — INFLUENZA PANEL BY PCR (TYPE A & B)
H1N1 flu by pcr: NOT DETECTED
Influenza A By PCR: NEGATIVE
Influenza B By PCR: NEGATIVE

## 2010-06-22 LAB — BASIC METABOLIC PANEL
BUN: 9 mg/dL (ref 6–23)
CO2: 24 mEq/L (ref 19–32)
Calcium: 8.5 mg/dL (ref 8.4–10.5)
Chloride: 105 mEq/L (ref 96–112)
Creatinine, Ser: 0.98 mg/dL (ref 0.4–1.5)
GFR calc Af Amer: >60 mL/min (ref >60)
GFR calc non Af Amer: >60 mL/min (ref >60)
Glucose, Bld: 132 mg/dL — ABNORMAL HIGH (ref 70–99)
Potassium: 3.6 mEq/L (ref 3.5–5.1)
Sodium: 136 mEq/L (ref 135–145)

## 2010-06-22 LAB — GLUCOSE, CAPILLARY: Glucose-Capillary: 92 mg/dL (ref 70–99)

## 2010-06-23 ENCOUNTER — Encounter: Payer: Self-pay | Admitting: Family Medicine

## 2010-06-23 ENCOUNTER — Inpatient Hospital Stay (HOSPITAL_COMMUNITY): Payer: Medicare Other

## 2010-06-23 LAB — BASIC METABOLIC PANEL
BUN: 11 mg/dL (ref 6–23)
CO2: 22 mEq/L (ref 19–32)
Calcium: 8.8 mg/dL (ref 8.4–10.5)
Chloride: 103 mEq/L (ref 96–112)
Creatinine, Ser: 0.93 mg/dL (ref 0.4–1.5)
GFR calc Af Amer: >60 mL/min (ref >60)
GFR calc non Af Amer: >60 mL/min (ref >60)
Glucose, Bld: 151 mg/dL — ABNORMAL HIGH (ref 70–99)
Potassium: 4.1 mEq/L (ref 3.5–5.1)
Sodium: 135 mEq/L (ref 135–145)

## 2010-06-23 LAB — CBC
HCT: 33.3 % — ABNORMAL LOW (ref 39.0–52.0)
Hemoglobin: 11.2 g/dL — ABNORMAL LOW (ref 13.0–17.0)
MCH: 31.1 pg (ref 26.0–34.0)
MCHC: 33.6 g/dL (ref 30.0–36.0)
MCV: 92.5 fL (ref 78.0–100.0)
Platelets: 215 10*3/uL (ref 150–400)
RBC: 3.6 MIL/uL — ABNORMAL LOW (ref 4.22–5.81)
RDW: 13.5 % (ref 11.5–15.5)
WBC: 14.5 10*3/uL — ABNORMAL HIGH (ref 4.0–10.5)

## 2010-06-24 LAB — BASIC METABOLIC PANEL
BUN: 12 mg/dL (ref 6–23)
CO2: 24 mEq/L (ref 19–32)
Calcium: 9 mg/dL (ref 8.4–10.5)
Chloride: 104 mEq/L (ref 96–112)
Creatinine, Ser: 0.99 mg/dL (ref 0.4–1.5)
GFR calc Af Amer: >60 mL/min (ref >60)
GFR calc non Af Amer: >60 mL/min (ref >60)
Glucose, Bld: 104 mg/dL — ABNORMAL HIGH (ref 70–99)
Potassium: 4 mEq/L (ref 3.5–5.1)
Sodium: 139 mEq/L (ref 135–145)

## 2010-06-24 LAB — CBC
HCT: 31.3 % — ABNORMAL LOW (ref 39.0–52.0)
Hemoglobin: 10.8 g/dL — ABNORMAL LOW (ref 13.0–17.0)
MCH: 32 pg (ref 26.0–34.0)
MCHC: 34.5 g/dL (ref 30.0–36.0)
MCV: 92.6 fL (ref 78.0–100.0)
Platelets: 211 10*3/uL (ref 150–400)
RBC: 3.38 MIL/uL — ABNORMAL LOW (ref 4.22–5.81)
RDW: 13.5 % (ref 11.5–15.5)
WBC: 18.1 10*3/uL — ABNORMAL HIGH (ref 4.0–10.5)

## 2010-06-30 NOTE — H&P (Signed)
[name], [name] NO.:  [id number]  MEDICAL RECORD NO.:  [id number]           PATIENT TYPE:  E  LOCATION:  [facility]                         FACILITY:  [facility]  PHYSICIAN:  Brendia Sacks, MD    DATE OF BIRTH:  1933-01-19  DATE OF ADMISSION:  06/21/2010 DATE OF DISCHARGE:                             HISTORY & PHYSICAL   REFERRING PHYSICIAN:  Zadie Rhine, MD  PRIMARY CARE PHYSICIAN:  [name], MD  PRIMARY PULMONOLOGIST:  Rennis Chris. [name], MD, FCCP, FACP  CHIEF COMPLAINT:  Shortness of breath.  HISTORY OF PRESENT ILLNESS:  This is a 77 year old male who presents to the emergency room today with fever, increasing shortness of breath, and productive cough.  The patient first noticed symptoms 5 days ago when he became quite short of breath after splitting wood in his yard.  Of note he normally does get quite short of breath with exertion but this was worse than usual and taken several hours to recuperate.  He had no pain. Over the next couple of days, he continued to have nonspecific symptoms but yesterday developed a fever up to 102.2, increasing shortness of breath, and a productive cough.  He saw his primary care physician in the office today where he had chest x-ray done which showed bilateral pneumonia and he was noted to have a leukocytosis and he was sent to the emergency room for further evaluation and consideration of admission.  In the emergency room, he is afebrile but is noted to be short of breath.  His chest x-ray suggest developing pneumonia.  He has been referred for admission.  The patient has a history of pneumonia.  He has had multiple bouts approximately within the last year.  REVIEW OF SYSTEMS:  Negative for changes to his vision, sore throat, rash, muscle aches, chest pain, nausea, vomiting, abdominal pain.  He did have one loose stool yesterday which is out of character for him. He had urinary frequency last night which is  not usual for him.  PAST MEDICAL HISTORY: 1. Severe GI bleed secondary to an esophageal erosion requiring 7     units of packed red blood cells.  This occurred in June 2011. 2. Coronary artery disease with stent placement.  No history of MI. 3. Hypothyroidism. 4. Pituitary tumor.  He is currently on bromocriptine. 5. GERD. 6. COPD. 7. BPH.  PAST SURGICAL HISTORY: 1. Nissen fundoplication. 2. Abdominal hernia repair. 3. .Bilateral feet surgery.  ALLERGIES:  PENICILLIN, ZETIA, and ZOCOR.  He currently apparently is on pravastatin.  Penicillin simply caused him to become lightheaded but he had no rash, hives, or anaphylaxis.  FAMILY HISTORY:  Brother died of an aortic aneurysm, father died of heart failure, and sister died of heart failure.  MEDICATIONS:  Full medical reconciliation is pending but include: 1. Spiriva 1 inhalation daily. 2. Symbicort 250/50, 2 puffs b.i.d. 3. Albuterol rescue inhaler. 4. Oxygen as needed. 5. Aspirin 8l mg p.o. daily. 6. Levothyroxine 50 mcg p.o. daily. 7. Mucinex. 8. Singulair. 9. Bromocriptine 25 mg p.o. daily. 10.Protonix 40 mg p.o. daily.  PHYSICAL EXAMINATION:  VITAL SIGNS:  Blood pressure 132/80, pulse 102, respirations reviewed, temperature is 98.0, sat 96%. GENERAL:  Well-developed, well-nourished man who appears to be mildly to moderately ill. HEENT:  Head appears to be normal.  Eyes: Sclerae clear.  He wears glasses.  Pupils equal, round, and reactive to light.  Lids, irises, and conjunctivae appear unremarkable.  ENT: Hearing is grossly normal. Lips, gums, and tongue appear unremarkable.  Dentition is in fair repair. NECK:  Supple.  No lymphadenopathy or masses.  No thyromegaly. CHEST:  Has few wheezes, decreased breath sounds.  No frank rhonchi are heard.  He is mildly tachypneic and dyspneic.  He is able to speak in full sentences but does need to take a break in between more than few sentences.  He is in no acute distress at  this point. CARDIOVASCULAR:  Tachycardic, regular rhythm.  No murmur, rub, or gallop.  No lower extremity edema. ABDOMEN:  Soft, nontender, nondistended.  No masses are appreciated. SKIN:  Normal without rash or induration.  It is nontender to palpation. EXTREMITIES:  Strength in the upper and lower extremities appears to be grossly normal and symmetric. PSYCHIATRIC:  Grossly normal affect.  Speech is fluent and appropriate, grossly normal mood.  IMAGING:  Chest x-ray developing airspace disease versus atelectasis.  PERTINENT LABORATORY STUDIES:  Basic I-STAT panel is essentially unremarkable.  Hemoglobin/hematocrit here noted to be 12.6 and 37. Review of the blood work sent from Dr. Kathi Der office is notable for white blood cell count of 12.4, hemoglobin 12.6 overnight study, and platelet count 212,000.  Assays dated today.  ASSESSMENT/PLAN:  A 77 year old male who presents with cough, fever, and shortness of breath. 1. Community-acquired pneumonia.  Based on the patient's history,     clinical presentation, and exam, this appears to be community-     acquired pneumonia.  He will be admitted to the medical floor as he    is nontoxic at this point but he given his overall clinical     appearance, he would benefit from IV antibiotics, we will place him     on Levaquin. 2. History of chronic obstructive pulmonary disease with intermittent     oxygen use.  Continue oxygen supplementation as needed.  We will     continue his Symbicort and nebulizer treatments at this time.     Consider restarting Spiriva in 1-2 days.  I do not think this     represents a chronic obstructive pulmonary disease exacerbation at     this point. 3. Hypothyroidism.  This appears to be stable.  Continue Synthroid. 4. History of pituitary tumor.  He will continue bromocriptine. 5. History of GERD.  Continue Protonix. 6. History of coronary artery disease.  This appears to be stable.     Continue aspirin.  He is  presumably not on a beta blocker secondary     to his chronic obstructive pulmonary disease.  His last     echocardiogram on files from July 2009 at which point he had a left     ventricular ejection fraction estimated at 60%.  Some diastolic     function was thought to be possible. 7. Rather the patient wishes to be full code.     Brendia Sacks, MD     DG/MEDQ  D:  06/21/2010  T:  06/21/2010  Job:  [id number]  cc:   [name], M.D. [place] D. [name], MD, [facility], FACP  Electronically Signed by Brendia Sacks  on 06/30/2010 10:00:21 PM

## 2010-07-05 ENCOUNTER — Ambulatory Visit (INDEPENDENT_AMBULATORY_CARE_PROVIDER_SITE_OTHER): Payer: Medicare Other | Admitting: Internal Medicine

## 2010-07-05 ENCOUNTER — Encounter: Payer: Self-pay | Admitting: Internal Medicine

## 2010-07-05 VITALS — BP 100/68 | HR 63 | Temp 97.1°F | Ht 67.0 in | Wt 167.0 lb

## 2010-07-05 DIAGNOSIS — J449 Chronic obstructive pulmonary disease, unspecified: Secondary | ICD-10-CM

## 2010-07-05 MED ORDER — FAMOTIDINE 20 MG PO TABS: ORAL_TABLET | ORAL | Status: DC

## 2010-07-05 MED ORDER — BUDESONIDE-FORMOTEROL FUMARATE 160-4.5 MCG/ACT IN AERO: INHALATION_SPRAY | RESPIRATORY_TRACT | Status: DC

## 2010-07-05 MED ORDER — PANTOPRAZOLE SODIUM 40 MG PO TBEC: 40.0000 mg | DELAYED_RELEASE_TABLET | Freq: Every day | ORAL | Status: DC

## 2010-07-05 NOTE — Assessment & Plan Note (Signed)
The differential diagnosis of difficult to control airways disorders is extensive with no quick and easy answers but easy to remember because it consists of 12 A's,  Two Bs and one C: 1. Adherence is always the initial "prime suspect" and is a multilayered concern that requires a "trust but verify" approach in every patient - starting with knowing how to use medications, especially inhalers, correctly, keeping up with refills and understanding the fundamental difference between maintenance and prns vs those medications only taken for a very short course and then stopped and not refilled.  The proper method of use, as well as anticipated side effects, of this metered-dose inhaler are discussed and demonstrated to the patient. Improved to 50% 2. Acid reflux disease, with the greater proportion of pulmonary patients with no overt heartburn symptoms, and no easy way to treat non-acid reflux  Rec add Pepcid at bedtime and reviewed diet 3. Ace inhibitor use,  >not relevant here  4. Active sinus dz, best addressed by a sinus ct > next step 5. Active smoking,  Usually sureptitious in this setting> >not relevant here  6. Allergic diseases, usually with a hx dating back to childhood and more of a challenge in younger patients 7. Aspiration, a perennial problem in the elderly or other patients at risk > high risk here so watch diet as per ST recs and no fish oil. 8. 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 Alpha one Antitrypsin deficiency, a must screen in patients with chronic airflow obstruction syndromes out of proportion to smoking history. 10. Adverse effect of inhalers, especially DPI's and especially with poor inhaler technique 11 Anxiety, always a diagnosis of exclusion 12 Acquaintance of or Actual healthcare provider ( which can be very challenging) Two B's Bronchiectasis and Beta blocker effects > reported on previous ct but not on most recent;  Not on eyedrops  or other BB One C Congestive heart failure, nicely ruled out now with BNP level of < 100 when symptomatic

## 2010-07-05 NOTE — Patient Instructions (Signed)
Continue protonix 40 mg  Take 30-60 min before first meal of the day and Pepcid 20 mg at bedtime   GERD (REFLUX)  is an extremely common cause of respiratory symptoms, many times with no significant heartburn at all.    It can be treated with medication, but also with lifestyle changes including avoidance of late meals, excessive alcohol, smoking cessation, and avoid fatty foods, chocolate, peppermint, colas, red wine, and acidic juices such as orange juice.  NO MINT OR MENTHOL PRODUCTS SO NO COUGH DROPS  USE SUGARLESS CANDY INSTEAD (jolley ranchers or Stover's)  NO OIL BASED VITAMINS (No fish oil)  Try symbicort 160 Take 2 puffs first thing in am and then another 2 puffs about 12 hours later (ok to use the lower strength until used up)  Work on inhaler technique:  relax and gently blow all the way out then take a nice smooth deep breath back in, triggering the inhaler at same time you start breathing in.  Hold for up to 5 seconds if you can.  Rinse and gargle with water when done   If your mouth or throat starts to bother you,   I suggest you time the inhaler to your dental care and after using the inhaler(s) brush teeth and tongue with a baking soda containing toothpaste and when you rinse this out, gargle with it first to see if this helps your mouth and throat.     For cough use mucinex 600 mg 2 every 12 hours if needed   Call if your condition worsens call right away and let us know   Otherwise we will see you in a month here.

## 2010-07-05 NOTE — Progress Notes (Signed)
  Subjective:    Patient ID: [name], male    DOB: Jun 08, 1932, 77 y.o.   MRN: [id number]  HPI  5 yowm quit smoking completely around 1980 (relatively light) with cough and short of breath eval by  Dr [name]  Then [name] dx of copd/ bronchiectasis, no pft's on file.   07/05/2010 ov cc recurrent pna sine June 2011 cc not back to baseline in terms of activities he enjoyed in May, for example  Could do some yarwork then and rarely  Needed saba and no need for any maint rx,   but not now on symbicort and spriva for sob and still frequent tightness generalized front more than back,  Equal both sides  low grade fever and mucus gets thick and yellow> admit to [name] April 24-27 by [facility] dx of ? Pna.   No sob at rest.  Sleeping ok without nocturnal  or early am exac of resp c/o's or need for noct saba.   Pt denies any significant sore throat, dysphagia, itching, sneezing,  nasal congestion or excess/ purulent secretions,  fever, chills, sweats, unintended wt loss, pleuritic or exertional cp, hempoptysis, orthopnea pnd or leg swelling.    Also denies any obvious fluctuation of symptoms with weather or environmental changes or other aggravating or alleviating factors.   Past Medical History:   HYPOTENSION (ICD-458.9)  DYSPNEA (ICD-786.05)  HYPERLIPIDEMIA (ICD-272.4)  CORONARY ARTERY DISEASE (ICD-414.00)  BENIGN PROSTATIC HYPERTROPHY, HX OF (ICD-V13.8)  DIVERTICULOSIS, MILD (ICD-562.10)  OSTEOPOROSIS (ICD-733.00)  DEGENERATIVE JOINT DISEASE (ICD-715.90)  ESOPHAGITIS (ICD-530.10)  ANEMIA (ICD-285.9)  COLONIC POLYPS (ICD-211.3)  HYPERPROLACTINEMIA (ICD-253.1)  OTHER SPECIFIED HYPOTENSION (ICD-458.8)  HYPOTHYROIDISM (ICD-244.9)  PITUITARY ADENOMA (ICD-227.3)  COPD (ICD-496)  Bonchiectasis RLL- CT 2009   Past Surgical History:  Inguinal herniorrhaphy  PTCA/stent  LEFT & RIGHT FOOT RECONSTRUCTION  hiatal hernia repair 2009   Family History:   neg for pituitary dz  mother died age 28 from  phlebitis  father died age 52 from heart failure  8 siblings  alive age 16,80,89,92  1 died age 20 heart failure  3 died age 9,76,90 from heart problems   Social History:  married  2 children  retiired  never smoked  no etoh     Review of Systems     Objective:   Physical Exam Pleasant mod hoarse amb wm nad wt 167 07/05/2010  HEENT mild turbinate edema.  Oropharynx no thrush or excess pnd or cobblestoning.  No JVD or cervical adenopathy. Mild accessory muscle hypertrophy. Trachea midline, nl thryroid. Chest was hyperinflated by percussion with diminished breath sounds and moderate increased exp time with bilteral sonorous rhonchi. Hoover sign positive at mid inspiration. Regular rate and rhythm without murmur gallop or rub or increase P2 or edema.  Abd: no hsm, nl excursion. Ext warm without cyanosis or clubbing.       Ct 10/25/2010 No evidence of pulmonary embolus.  4.4 cm ascending thoracic aortic aneurysm.  Debris/mucus plugging the right lower lobe bronchi with associated  airspace disease in the right lower lobe.   Assessment & Plan:

## 2010-07-11 NOTE — Discharge Summary (Signed)
[name], [name]               ACCOUNT NO.:  [id number]  MEDICAL RECORD NO.:  [id number]           PATIENT TYPE:  I  LOCATION:  5506                         FACILITY:  [facility]  PHYSICIAN:  Osvaldo Shipper, MD     DATE OF BIRTH:  Jul 04, 1932  DATE OF ADMISSION:  06/21/2010 DATE OF DISCHARGE:  06/25/2010                              DISCHARGE SUMMARY   PRIMARY CARE PHYSICIAN:  [name], MD in [place].  No consultations obtained during this admission.  Imaging studies done include chest x-ray on June 21, 2010, which showed bibasilar atelectasis versus developing airspace disease.  He also had a swallow function, which did not show any dysphagia.  DISCHARGE DIAGNOSES: 1. Community-acquired pneumonia, improving. 2. History of chronic obstructive pulmonary disease, stable, on     tapering dose of steroids. 3. History of gastroesophageal reflux disease, on proton pump     inhibitor. 4. History of pituitary tumor, on bromocriptine. 5. Constipation, on laxatives. 6. Hypothyroidism, on Synthroid.  BRIEF HOSPITAL COURSE:  Briefly, this is a 77 year old Caucasian male who presented to the hospital with complaints of shortness of breath. He was found to have x-ray suggestive of pneumonia.  The patient was put on antibiotics and was admitted to the hospital.  He has done well in the last 2-3 days.  He has not required any oxygen.  His breathing is better.  He has been ambulating with no difficulties.  His Levaquin was changed over to p.o.  Swallow evaluation was done because of concern for aspiration and no aspiration was detected.  He has been seen by GI in the past and has had endoscopy because he does get a feeling sometimes that a pill get stuck in his esophagus.  I have asked him to follow up with Dr. Jarold Motto as needed.  He also tells me that this is his fourth episode of pneumonia over the last 1 year.  Reason for this is not entirely clear.  He is followed by Dr.  Jetty Duhamel and I have encouraged him to follow up with him once again.  He had leukocytosis during this admission, which is probably from steroids.  He has not had any fevers.  Rest of his medical issues are quite stable.  He was started on tapering doses of steroids, which will be continued in the home.  He has had constipation.  He was given a suppository this morning and is waiting to have a bowel movement.  I have told him that he can take his MiraLax twice daily until he has a bowel movement.  I have also given a prescription for Fleet Enema.  On the day of discharge, the patient is feeling well.  He denies any nausea/vomiting.  He is passing gas from below, but just has not had a bowel movement.  Denies any abdominal pain.  His breathing is much better.  His vital signs show that he is afebrile.  His heart rate is 92, respiratory rate is 18, blood pressure 119/74, he is saturating 94% on room air.  His lungs are clear to auscultation bilaterally.  No wheezing, rales, or rhonchi.  His abdomen is soft.  Overall, the patient is stable for discharge.  DISCHARGE MEDICATIONS: 1. Fleet Enema daily as needed for constipation. 2. Mucinex 2 tablets p.o. b.i.d. 3. Levaquin 750 mg p.o. daily for 4 more days. 4. Prednisone taper. 5. MiraLax 17 g p.o. b.i.d. as needed for constipation. 6. Albuterol inhaler 2 puffs every 4 hours as needed for shortness of     breath. 7. Aspirin 81 mg p.o. daily at bedtime. 8. Acetaminophen 325 mg every 4 hours as needed for aches and pains. 9. Bromocriptine 5 mg daily at bedtime. 10.Clear Eyes eyedrops over the counter 4 times daily as needed. 11.Docusate 100 mg over the counter daily at bedtime. 12.Ferrous sulfate 40 mg p.o. daily. 13.Levothyroxine 50 mcg p.o. daily. 14.Multivitamins 1 tablet p.o. daily. 15.Meclizine 25 mg p.o. b.i.d. as needed for dizziness. 16.Nitroglycerin sublingually as needed for chest pain. 17.Omega-3 one capsule  daily. 18.Pravachol 40 mg daily at bedtime. 19.Protonix 40 mg p.o. daily. 20.Singulair 10 mg p.o. daily as needed for allergies. 21.Spiriva 18 mcg inhaled daily. 22.Symbicort 80/4.5 two puffs inhaled b.i.d. 23.Vitamin D3 over the counter 1 tablet daily.  FOLLOWUP:  With Dr. Rudi Heap in 1 week, with his other specialists as discussed earlier.  DIET:  Heart-healthy.  PHYSICAL ACTIVITY:  As before.  PT at home will be provided by home health.  TOTAL TIME ON THIS DISCHARGE ENCOUNTER:  35 minutes.     Osvaldo Shipper, MD     GK/MEDQ  D:  06/25/2010  T:  06/26/2010  Job:  [id number]  cc:   [name], M.D. Clinton D. [name], MD, [facility], FACP  Electronically Signed by Osvaldo Shipper MD on 07/11/2010 09:54:17 PM

## 2010-07-13 NOTE — Assessment & Plan Note (Signed)
[facility]                            CARDIOLOGY OFFICE NOTE   QUINLAN, VOLLMER                        MRN:          [id number]  DATE:08/20/2008                            DOB:          1932-03-13    PRIMARY CARE PHYSICIAN:  [name], MD   REASON FOR PRESENTATION:  Evaluate the patient with coronary artery  disease.   HISTORY OF PRESENT ILLNESS:  The patient presents for followup of the  above.  Since I last saw him, he has done well.  He has been active  doing such things as working in his yard and [employer].  With this,  he denies any chest discomfort, neck or arm discomfort.  He has had no  palpitations, presyncope or syncope.  He denies any PND or orthopnea.   He has had no problems with low blood pressure which was a problem  recently.   PAST MEDICAL HISTORY:  Coronary artery disease (Taxus stenting to his  LAD in 2008.  He has nonobstructive disease in 3 vessels.  He has a  posterolateral with 90% stenosis, but this is a small vessel managed  medically.  Last stress perfusion study was May 2009 demonstrating an EF  of 68% with no ischemia or infarct), diverticulosis with recurrent GI  bleeding, chronic obstructive pulmonary disease, benign prostatic  hypertrophy, anemia, colonic polyps, esophagitis, prostatic hypertrophy,  degenerative joint disease, osteoporosis, history of pituitary tumor,  hypothyroidism, and hiatal hernia.   ALLERGIES:  PENICILLIN.   MEDICATIONS:  1. Calcium.  2. Bromocriptine 5 mg daily.  3. Vitamin D.  4. Multivitamin.  5. Levothyroxine 50 mcg daily.  6. Fish oil.  7. Actonel 150 mg monthly.  8. Aspirin 81 mg daily.  9. Pravastatin 40 mg daily.  10.Plavix 75 mg daily.  11.Mucinex.   REVIEW OF SYSTEMS:  As stated in the HPI and otherwise negative for  other systems.   PHYSICAL EXAMINATION:  GENERAL:  The patient is in no distress.  VITAL SIGNS:  Blood pressure 106/64, heart rate 62 and  regular, weight  158 pounds, body mass index 24.  HEENT:  Eyelids are unremarkable, pupils are equal, round and reactive  to light, fundi not visualized, oral mucosa unremarkable.  NECK:  No jugular venous distention at 45 degrees.  Carotid upstroke  brisk and symmetric.  No bruits, no thyromegaly.  LYMPHATICS:  No adenopathy.  LUNGS:  Clear to auscultation bilaterally.  CHEST:  Unremarkable.  HEART:  PMI not displaced or sustained, S1 and S2 within normal limits,  no S3, no S4, no clicks, no rubs, no murmurs.  ABDOMEN:  Flat, positive  bowel sounds.  Normal in frequency and pitch.  No bruits, no rebound, no  guarding, no midline pulsatile mass, no organomegaly.  SKIN:  No rashes, no nodules.  EXTREMITIES:  2+ pulse, no edema.   EKG; sinus rhythm, rate 63, axis within normal limits, intervals within  normal limits, no acute ST-T wave changes.   ASSESSMENT AND PLAN:  1. Coronary artery disease.  The patient is having no new symptoms.  No further cardiovascular testing is suggested.  He will continue      with the medications as listed.  2. Hypotension.  He is not having any problems with this.  He is      tolerating the medication regimen as listed and will continue this.      3.  Dyslipidemia.  I reviewed the lipids done in Dr. Kathi Der      office.  He has excellent control of this and management.  He will      continue on the meds as listed.  3. Followup.  I will see him back in 1 year or sooner if needed.      Rollene Rotunda, MD, [facility]  Electronically Signed    JH/MedQ  DD: 08/20/2008  DT: 08/21/2008  Job #: [id number]   cc:   [name], M.D.

## 2010-07-13 NOTE — Cardiovascular Report (Signed)
NAME:  [name], [name]               ACCOUNT NO.:  [id number]   MEDICAL RECORD NO.:  [id number]          PATIENT TYPE:  OIB   LOCATION:  NA                           FACILITY:  [facility]   PHYSICIAN:  Bevelyn Buckles. Bensimhon, MDDATE OF BIRTH:  1932-08-17   DATE OF PROCEDURE:  01/29/2007  DATE OF DISCHARGE:                            CARDIAC CATHETERIZATION   CARDIOLOGIST:  Rollene Rotunda, MD, [facility]   PRIMARY CARE PHYSICIAN:  [name], M.D.   INTERVAL HISTORY:  Mr. Gwyn is a delightful 77 year old male with a  history of coronary artery disease, hypertension and hyperlipidemia.  He  underwent stenting of his LAD by Dr. Excell Seltzer in August 2008.  He has  known right coronary artery disease, but this is a small vessel and has  been treated medically.  He presents today for follow-up catheterization  as part of the Taxus Perseus research protocol.   PROCEDURES PERFORMED:  1. Selective coronary angiography.  2. Left heart catheterization   DESCRIPTION OF PROCEDURE:  The risks and indication of the procedure  were explained.  Consent was signed and placed on the chart.  The right  groin area was prepped and draped in routine sterile fashion,  anesthetized with 1% local lidocaine.  A JL-5 was used to inject the  left coronary system and 3-D RC was used to inject the right coronary  system as well as cross the aortic valve.  The first 2 pictures were pre-  specified angles by the study protocol.  He was also given nitroglycerin  as part of this protocol.  The remainder of the angles were done to  assess for clinical indications.   1. Central aortic pressure 117/67 with mean of 88.  2. LV 106/4 with an EDP of 8.  3. There was no aortic stenosis.   LEFT MAIN:  Short and was heavily calcified.  There was a 30% ostial  stenosis.   LEFT ANTERIOR DESCENDING ARTERY:  A long vessel coursing to the apex.  It gave off 2 moderate-sized diagonals in the proximal to mid portion.  In the very  proximal LAD there was a 40-50% smooth plaque.  This area  was calcified.  After that there was stent in the proximal portion,  which was widely patent.  There was some mild to moderate ostial disease  in the second diagonal.  Otherwise, the LAD was free of significant  stenosis.   LEFT CIRCUMFLEX.  Appeared to be a large codominant vessel.  It gave off  a small ramus branch, a large OM1 and a large PDA.  There was 80% lesion  in the proximal portion of the circumflex, a 60% lesion in the  midportion of the OM; the PDA was free of critical disease.   RIGHT CORONARY ARTERY:  A small codominant vessel.  It gave off a small  PDA and a very tiny posterolateral.  In the proximal portion of the RCA  there was a 50-60% lesion, in the midportion there was a 60-70% lesion,  in the ostium of the PDA there was a 90% lesion.  This was an  approximately 1.5-2.0 mm vessel which pumped up slightly with  nitroglycerin.   ASSESSMENT:  1. Two-vessel coronary artery disease with widely patent left anterior      descending stent.  2. The right coronary artery was small with a high-grade posterior      descending lesion; which I suspect is too small of a vessel to do      anything about percutaneously.  I do wonder about the lesion in the      midsection of the right coronary artery.   PLAN/DISCUSSION:  I will review his catheterization films with Dr.  Excell Seltzer and Dr. Juanda Chance.  I will continue medical management for now.      Bevelyn Buckles. Bensimhon, MD  Electronically Signed     DRB/MEDQ  D:  01/29/2007  T:  01/29/2007  Job:  [id number]   cc:   Rollene Rotunda, MD, [facility]  [name], M.D.

## 2010-07-13 NOTE — Discharge Summary (Signed)
NAMEZORAWAR, STROLLO               ACCOUNT NO.:  [id number]   MEDICAL RECORD NO.:  [id number]          PATIENT TYPE:  INP   LOCATION:  2019                         FACILITY:  [facility]   PHYSICIAN:  Madolyn Frieze. Jens Som, MD, FACCDATE OF BIRTH:  [birth year]   DATE OF ADMISSION:  09/10/2007  DATE OF DISCHARGE:  09/12/2007                         DISCHARGE SUMMARY - REFERRING   DISCHARGE DIAGNOSES:  1. Hypotension related to medications.  2. History of pituitary tumor but with normal cortisol levels.  3. History as listed below in Past Medical History.   DISCHARGING PHYSICIAN:  Dr. Jens Som.   PHYSICIANS:  Dr. Antoine Poche, his primary cardiologist; Dr. Everardo All, his  endocrinologist; and Dr. Christell Constant with [facility],  his primary care physician.   SUMMARY OF HISTORY:  Mr. Holzmann is a 77 year old male who presented  to the emergency room with presyncope.  He states that he has noticed  his blood pressure has been in the 80s.  At the barbershop he felt weak  and short of breath, shaky, pale, and diaphoretic.  He felt like he was  blacking out, but he did not lose consciousness.  His wife gave him a  sublingual nitroglycerin and oxygen.  EMS was called and after about a  liter and a half of fluid his blood pressure was again still variable  between 85 and 107 systolically.  He is admitted for further evaluation.   PAST MEDICAL HISTORY:  Notable for coronary artery disease with drug-  eluting stent in the __________ study, pituitary tumor, preserved LV  function, hypertension, hyperlipidemia, DJD, osteoporosis, COPD, anemia,  BPH, hypothyroidism, GERD, colon polyps, diverticular bleed remotely.   LABORATORIES:  Chest x-ray on the 13th did not show any active disease.  Admission weight was 78.8 kg.  At the time of discharge his blood  pressure was 99/64.  Orthostatic blood pressures at the time of  discharge showed a lying blood pressure 93/58, sitting 107/66, standing  90/64, heart rate lying 54, sitting 58, standing 72.  Admission CBC  showed H&H of 10.6 and 31.13, normal indices, platelets 175, WBC 7.3.  Prior to discharge H&H was 11.3 and 33.2, normal indices, platelets 189,  WBC 6.3.  PTT 29, PT 140, PT 14.7.  Sodium 140, potassium 3.8, BUN 10,  creatinine 1.02, glucose 80.  Normal LFTs.  CK-MBs, relative indexes,  and troponins were within normal limits x3.  TSH was 1.674 with a free  T4 1.14 and free T3 of 2.6.  Cortisol levels on the 13th were 24.4 at  1433 and 4.2 at 1854 and on the 14th at 7:16 11.3.   Echocardiogram on the 14th showed EF of 60%, no evidence of wall motion  abnormalities, right ventricular enlargement.  EKG showed normal sinus  rhythm.  It was reported in the emergency room that he has some  junctional bradycardia.   HOSPITAL COURSE:  The patient was admitted by Theodore Demark, PA-C and  Dr. Bonnee Quin.  [place] medicine consult was also obtained given the  degree of his hypertension, hypotension, and history of pituitary tumor.  By the 14th he was feeling  better.  Blood pressure had improved to  92/50.  Dr. Jens Som noted the transit junctional rhythm on telemetry,  but felt that his symptoms were more compatible with hypotension.  Case  manager assisted with potential discharge needs.  Cortisol levels were  obtained and were essentially unremarkable.  Echocardiogram was  performed and also unremarkable.  By the 15th Dr. Jens Som and internal  medicine felt that he could be discharged home.  His Cardura and Imdur  were discontinued.  Dr. Jens Som prescribed Flomax to take the place of  Cardura.   DISPOSITION:  The patient is discharged home on September 12, 2007.  Asked to  maintain low-sodium, heart-healthy diet.  Wound care is not applicable.  Activities are not restricted.  He was advised not to take his Imdur  (isosorbide) and Cardura (doxazosin).   NEW MEDICATIONS:  1. Flomax 0.4 mg p.o. daily.  He was advised to  continue Levothyroxine 50 mcg daily.  Spiriva 18 mcg daily.  Mucinex 600 mg b.i.d.  Nexium 40 mg or Capadex 60 mg in the morning and Nexium 40 mg in the  evening.  Aspirin 81 mg daily.  Actonel 150 mg monthly.  Bromocriptine 5 mg q.h.s.  Plavix 75 mg daily.  Advair and albuterol as previously.  Nitroglycerin as needed.  He was advised to continue his Citrucel, fish oil, iron, vitamin B12,  hyoscyamine, and Pravachol as previously.   He was asked to bring all medicine bottles and a blood pressure diary to  all appointments for follow-up.  Our office will call him with an event  monitor for further evaluation of any junctional rhythms.  He was asked  to follow up with Dr. Christell Constant in regards to his prostate and also  switching his Cardura to Flomax.  He will keep his appointment as  scheduled with Dr. Everardo All and will see Dr. Antoine Poche after the event  monitor on October 16, 2007 at 1:45.   Discharge time approximately 50 minutes.      Joellyn Rued, PA-C      Madolyn Frieze Jens Som, MD, [facility]  Electronically Signed    EW/MEDQ  D:  09/12/2007  T:  09/12/2007  Job:  [id number]   cc:   Rollene Rotunda, MD, [facility]  Sean A. Everardo All, MD  Christell Constant, M.D.

## 2010-07-13 NOTE — Assessment & Plan Note (Signed)
[facility]                             PULMONARY OFFICE NOTE   EDDISON, SEARLS                        MRN:          [id number]  DATE:11/06/2006                            DOB:          [birth year]    PROBLEM:  1. Asthma with chronic obstructive pulmonary disease.  2. Esophageal reflux.  3. Coronary disease.  4. Dyslipidemia.  5. Hypertension.   HISTORY:  Since last year chest discomfort was evaluated by Dr.  Antoine Poche, wife followed up on a previous question by noting that he does  little snoring and has little evident apnea.  He had had a small left  pleural effusion on chest x-ray in March when he was being evaluated for  chest pain.  He feels he can always cough up a little phlegm, usually  gray in color, especially if he bends over.  He notices wheeze in the  morning before he gets up, Advair seems to trigger cough. He feels  [place] his home near  [place].   MEDICATIONS:  Are charted and reviewed without significant change noted.   OBJECTIVE:  Weight 174 pounds, blood pressure 122/64, pulse 67, room air  saturation 97%.  There is a little rough upper airway quality and throat  clearing with cough but no rales or rhonchi.  Heart sounds are regular  without murmur.  There is no dullness or pleural rub.   Pulmonary function tests from July 30 showed mild obstructive disease,  mainly in small airways where there was insignificant response to  bronchodilator.  He showed hyperinflation with air trapping/increased  residual volume.  Diffusion was loudly reduced.   IMPRESSION:  Asthmatic bronchitis/chronic obstructive pulmonary disease.   PLAN:  1. Try off of Mucinex.  2. Chest x-ray to followup previous left effusion.  3. Try sample Spiriva once daily.  4. Schedule return 3 months, earlier p.r.n.     Clinton D. [name], MD, [facility], FACP  Electronically Signed    CDY/MedQ  DD: 11/06/2006   DT: 11/07/2006  Job #: [id number]   cc:   [name], M.D.

## 2010-07-13 NOTE — Assessment & Plan Note (Signed)
[facility]                             PULMONARY OFFICE NOTE   [name], [name]                     MRN:          [id number]  DATE:09/05/2006                            DOB:          15-Nov-1932    PULMONARY CONSULT   CARDIOLOGIST:  Dr. Antoine Poche.   PROBLEM:  A 77 year old gentleman referred through the courtesy of Dr.  Christell Constant in pulmonary consultation because of asthma and COPD. I had worked  with this gentleman at the old office many years ago and more recently  he had worked with the late Dr. Lowanda Foster.  He has had a history of  chronic lung disease attributed to chronic fixed asthma. He notices  shortness of breath with temperature extremes, strong odors, seasonal  pollen.  He notices cough and phlegm especially with exertion and easy  exertional dyspnea on a long term basis without any dramatic change  recently.  He expects to cough phlegm, usually gray or yellow and never  bloody.  He says he can ride his exercycle at 20 mph for 10 minutes and  he only very occasionally uses his albuterol rescue inhaler.  He wears a  dust mask doing yard work and had tried Advair in the past.   MEDICATION:  1. Aspirin 81 mg.  2. Plavix 75 mg.  3. Advair 250/50.  4. Pravastatin 40 mg.  5. Doxazosin 4 mg.  6. Nexium 40 mg.  7. Guaifenex 600 mg b.i.d.  8. Actonel.  9. Citracal with vitamin D.  10.Fish oil.  11.Vitamin B12.  12.P.r.n. use of an albuterol inhaler.  13.Occasional use of Singulair.  14.He has nitroglycerin available.   DRUG INTOLERANT:  PENICILLIN.   REVIEW OF SYSTEMS:  He will have to leave a restaurant if there are  smokers because it tightens him.  Productive morning cough, low back  pains, acid indigestion. He has noted easy ecchymotic bruising of his  thighs taking aspirin and Plavix.  Wife tells him he snores and stops  breathing.  She is concerned about sleep apnea.   PAST HISTORY:  Coronary artery disease with stent and  ejection fraction  60%, no myocardial infarction.  Asthma with chronic obstructive  pulmonary disease, anemia, diverticular bleed in 2006, chronic polyps,  esophageal reflux, prostatic hypertrophy, degenerative joint disease,  osteoporosis, hiatal hernia.  He has had at least one pneumonia and has  had Pneumococcal vaccine once. He does get flu vaccine.  Foot surgeries  and limited comfort with walking because of foot pain.   SOCIAL HISTORY:  Never smoked, no alcohol, married. 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   FAMILY HISTORY:  Sister with asthma, several with heart disease.   OBJECTIVE:  Weight 173 pounds.  BP 126/64.  Pulse 70.  Room air  saturation 97%, 5 feet 7 inches tall. Medium build.  SKIN:  Ecchymoses on both thighs.  Palate spacing 2/4, has own teeth, voice quality is normal.  No stridor, no neck vein distention.  No peripheral edema, cyanosis or clubbing.  Breath sounds are quiet with no  rales, rhonchi, dullness or wheeze.  HEART:  Sounds are regular with no murmur heard.   He mentions that best peak flow is around 350.   IMPRESSION:  1. Asthma with chronic obstructive pulmonary disease, probably on the      basis of chronic fixed asthma.  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  2. Esophageal reflux/gastroesophageal reflux disease.  3. Coronary artery disease.   RADIOLOGY:  I have got a report from April 05, 2006. Portable chest  describing bibasilar scarring or atelectasis and small left effusion at  that time.   PLAN:  1. CBC is done to rule out anemia as a basis for dyspnea since he has      had a history of prior GI bleed.  2. Reflux precautions were reemphasized.  3. He will continue present meds.  4. We discussed the possibility of a sleep study because he has      symptoms suggestive of sleep apnea and his wife has expressed some      concern.  He was reserved  and noncommittal about this, so I gave      him a patient education booklet on sleep apnea for him      to review.  5. Schedule return in 1 month, earlier p.r.n.  I appreciate the chance      to see him.     Clinton D. [name], MD, [facility], FACP  Electronically Signed    CDY/MedQ  DD: 09/07/2006  DT: 09/08/2006  Job #: [id number]   cc:   [name], M.D.

## 2010-07-13 NOTE — Consult Note (Signed)
[name], ZOLL               ACCOUNT NO.:  [id number]   MEDICAL RECORD NO.:  [id number]          PATIENT TYPE:  INP   LOCATION:  2912                         FACILITY:  [facility]   PHYSICIAN:  Valerie A. Felicity Coyer, MDDATE OF BIRTH:  [birth year]   DATE OF CONSULTATION:  09/10/2007  DATE OF DISCHARGE:                                 CONSULTATION   PRIMARY CARE PHYSICIAN:  [name], MD   ENDOCRINOLOGIST:  Cleophas Dunker Everardo All, MD   NEUROSURGEONPayton Doughty, MD   CARDIOLOGIST:  Rollene Rotunda, MD, [facility]   PULMONARY:  Rennis Chris. [name], MD, [facility], FACP   SURGERY:  Dr. Daphine Deutscher.   I have been requested by Dr. Riley Kill of [facility] Cardiology to evaluate  [name] today who is being admitted for hypotension in the setting  of known pituitary adenoma.  [name] is a 77 year old pleasant  white gentleman with multiple chronic medical issues including coronary  artery disease status post stent to the LAD in February 2008 as well as  COPD.  Earlier this spring, he had an incidentally-diagnosed pituitary  adenoma on cervical MRI, which was undergoing evaluation by Dr. Channing Mutters of  Neurosurgery.  Outpatient workup has shown this pituitary tumor to be  prolactin stimulating, consistent with a prolactinoma, and no other  clear endocrine deficiencies related to this tumor have been noted with  laboratory workup.  However, today while he was having a haircut at 8:30  this morning sitting in the barber's chair he developed sudden onset of  diaphoresis, shaking, and tremor associated with nausea and a dizzy  feeling but had no chest pain.  He took a nitroglycerin and said he had  progression of his same symptoms without relief and therefore called  911.  He had no loss of consciousness and no previous symptoms of near-  syncope, lightheadedness, dizziness, chest pain, nausea, or vomiting.  Due to his history of coronary artery disease, he was transferred to  [facility] where he has been evaluated  and admitted by [facility]iology.  No other headache, no vision changes, no falls, currently feels well,  and accordingly jokes he should be going home rather than being  admitted.  He also notes his outpatient physicians at this time have  opted for medical management of this pituitary adenoma given his other  medical illnesses and no benefit to removal of the tumor in the absence  of other anatomic deficiencies.   Past medical history is significant for:  1. Pituitary adenoma diagnosed by MRI, April 2009, as noted above,      associated with elevated prolactin level and mildly depressed TSH.  2. Dyslipidemia.  3. History of coronary artery disease status post stent to the LAD in      February 2008.  4. COPD, uses oxygen, nasal cannula, p.r.n. under the direction of Dr.      Jetty Duhamel.  5. History of GERD with esophagitis and hiatal hernia, was to have      undergone hiatal hernia repair electively, September 14, 2007, by Dr.      Daphine Deutscher.  6.  Anemia.  7. BPH.  8. History of lower GI bleed related to diverticulosis.   PAST SURGICAL HISTORY:  Bilateral foot reconstruction due to falling  arches.  He has had abdominal hernia surgery repair and a Taxus stent to  the LAD in February 2008 as noted.   FAMILY HISTORY:  Mother died at age 43 due to phlebitis following  childbirth, and father died at age 44 due to issues related to  congestive heart failure.  He also has had 3 sisters who passed from  heart trouble and a brother due to an MI and one sister due to stroke.   SOCIAL HISTORY:  He is married.  He is retired from Technical sales engineer.  He does not smoke, does not drink, and does not use illicit  drugs.  He has two children alive and well.   MEDICATIONS:  Per Centricity include:  1. Nexium 40 mg b.i.d.  2. Spiriva 18 mcg inhaler daily.  3. Singulair 10 mg daily.  4. Plavix 75 mg daily.  5. Pravachol 40 mg daily.  6. Doxazosin 8 mg daily.  7. Actonel 150 mg monthly.   8. Imdur 30 mg daily.  9. Hyoscyamine 0.125 mg p.r.n. spasm.  10.Nitroglycerin sublingual p.r.n.  11.Carafate 1 g q.i.d.  12.Aspirin 81 mg daily.  13.Synthroid 50 mcg daily.  14.Parlodel 5 mg nightly, begun Jul 26, 2007, increased from 2.5 mg      priorly prescribed in April.   ALLERGIES:  PENICILLIN.   REVIEW OF SYSTEMS:  CONSTITUTIONAL:  Positive for diaphoresis.  CARDIOVASCULAR:  Denies chest pain.  RESPIRATORY:  Previous shortness of  breath this morning which is currently resolved.  No cough or sputum.  ENT:  Shows no sore throat or difficulty swallowing.  GI:  Positive  nausea.  Positive reflux.  No current abdominal pain; chest as noted  above.  Last bowel movement this morning reportedly normal.  NEUROLOGICAL:  Denies headache, denies numbness, or vision changes.  SKIN:  No rashes, no lesions.  HEMATOLOGIC:  He does note a bruise to  the right inner thigh.  PSYCHIATRIC:  No history of depression or  anxiety.  Please see HPI above for other details.   PHYSICAL EXAMINATION:  VITAL SIGNS:  Blood pressure 95/55, pulse of 73,  respirations 18, temperature of 98.9, and oxygen saturations 98% on 2 L  nasal cannula.  GENERAL:  He is an awake, alert, pleasant, elderly white gentleman whose  appearance is appropriate to stated age and is in no acute distress.  EYES:  PERRL.  Sclerae are clear.  There is no conjunctival injection or  drainage.  ENT:  Shows moist mucous membranes and grossly normal hearing.  Oropharynx is clear.  NECK:  Supple.  There are no masses and no thyroid nodules or  appreciable thyromegaly.  CARDIOVASCULAR:  He has normal S1 and S2, regular rate and rhythm.  There is no bilateral lower extremity edema.  No appreciable JVD.  LUNGS:  A few coarse upper rhonchi bilaterally, anterior breath sounds  with no increased work of breathing or distress.  GI:  Soft abdomen which is nontender and nondistended.  Good bowel  sounds throughout.  No masses.  No appreciable  hepatosplenomegaly.  PSYCH:  Awake, alert and oriented x3 with a pleasant mood and normal  affect.  SKIN:  Shows no rashes.  Normal hair distribution in the legs.  No  ulcerations appreciated.  NEUROLOGICAL:  He is moving all extremities spontaneously with cranial  nerves II-XII symmetrically intact.  Speech is clear.  Gait not tested.   DATA:  Laboratory data reviewed from the ER and outpatient records show  a prolactin level of 35.3 one month after taking bromocriptine, previous  prolactin level of 139; also on Jul 26, 2007, showed cortisol of 1.5  following the night prior a single dose of dexamethasone 10 mg per EMR  records; TSH normal at 1.38, previously recorded at 5.3 in April; also  in April normal [facility] and [facility] levels.  Today, CBC shows hemoglobin of 10.6  with a normal white count of 7.3 and normal platelets of 175.  Point-of-  care cardiac enzymes are negative x1.  Coags normal with INR equivalent  of 1.1.  Basic metabolic panel also with normal electrolytes,  specifically normal sodium of 141, normal potassium of 4.1, normal BUN  of 11, creatinine of 1.1, and glucose of 90.  Chest x-ray shows no  active disease.  EKG reviewed, no acute ischemic changes.   ASSESSMENT/PLAN:  1. Hypotension with near syncopal symptoms.  The patient is currently      admitted by Cardiology to rule out cardiac causes.  We have been      asked to consult to rule out adrenal insufficiency in the setting      of known pituitary tumor.  The patient has no symptoms of sepsis or      shock, and I doubt adrenal insufficiency given previous normal      evaluation of adrenal function reviewed by phone with his      endocrinologist, Dr. Everardo All, following the dexamethasone      administration.  The patient also has normal sodium, normal      potassium, normal BUN and creatinine, as well as normal calcium and      no hypoglycemia.  He has had no previous nausea or vomiting      symptoms prior to today's  onset, for which he is currently being      admitted.  We will, however, check a random cortisol level as well      as fasting in the a.m. and consider performing an ACTH stimulation      test if cortisol levels are abnormally low.  At this time, there is      no clinical indication for stress steroids given low clinical      suspicion for adrenal crisis.  We will keep a differential and      follow with treatment as needed.  Suspect hypotension may have been      exacerbated by nitroglycerin administered by the patient prior to      admission for the setting and concern of possible anginal symptoms.  2. History of coronary artery disease.  Management is as per      Cardiology, the admitting team.  We will continue aspirin and      Plavix, monitor in Step-down Unit as noted, with further workup to      be determined depending on cardiac enzymes and telemetry and next      24 hours of results.  3. History of chronic obstructive pulmonary disease, oxygen p.r.n.,      currently stable, status post preoperative clearance by his      pulmonologist, Dr. [name].  We will continue his Spiriva and      Singulair without acute exacerbation noted.  4. History of anemia of chronic disease.  Hemoglobin appears stable,      with previous evaluation, is  likely related in part to chronic      gastrointestinal loss, monitor while hospitalized for any evidence      of acute blood loss.  5. History of gastroesophageal reflux disease with esophagitis and      hiatal hernia.  Continue the patient's proton pump inhibitor twice      daily and Carafate but expect surgical delay for hiatal hernia      repair until Cardiac clears.  6. Dyslipidemia.  Continue home statin.  7. Pituitary adenoma.  Note normal TSH, which decreased after empiric      administration of Synthroid as prescribed by endocrinologist, Dr.      Everardo All.  Thyroid function tests have been checked and are pending      at this time.  We will plan  to continue home dose of Synthroid as      he has been on for the last 3 months.  Recheck      prolactin level as his Parlodel was increased last at the end of      May, and we will recheck the levels for further dosing but continue      same at this time.  Please see problem #1 for further details.   I appreciate this consult and referral and see orders regarding further  details.      Valerie A. Felicity Coyer, MD  Electronically Signed     VAL/MEDQ  D:  09/10/2007  T:  09/11/2007  Job:  [id number]

## 2010-07-13 NOTE — Assessment & Plan Note (Signed)
[facility]                            CARDIOLOGY OFFICE NOTE   [name], [name]                        MRN:          [id number]  DATE:02/15/2007                            DOB:          [birth year]    PRIMARY:  Dr. Christell Constant.   REASON FOR PRESENTATION:  Evaluate the patient with coronary artery  disease status post cardiac catheterization.   HISTORY OF PRESENT ILLNESS:  The patient is 77 years old.  He was  recently cathed with followup in the PERSEUS trial.  The results of that  catheterization are described below.  He has done well.  He has had no  chest discomfort, neck or arm discomfort.  He has had no palpitations,  pre-syncope, or syncope.  He had some bruising with his leg, but this  has all resolved.   PAST MEDICAL HISTORY:  Coronary artery disease (status post stenting of  his LAD by Dr. Excell Seltzer in 2008.  Catheterization earlier this month  demonstrated LAD had widely patent stent.  The right coronary artery had  a high-grade PDA lesion, which was a small vessel).  Well-preserved  ejection fraction.  Asthma.  Chronic obstructive pulmonary disease.  Anemia.  Diverticular bleed June 2006.  Colonic polyps.  Gastroesophageal reflux disease.  Esophagitis.  Prostatic hypertrophy.  Degenerative joint disease.  Osteoporosis.   ALLERGIES:  PENICILLIN.   MEDICATIONS:  1. Aspirin 81 mg daily.  2. Plavix 75 mg daily.  3. Advair 250/50 daily.  4. Pravastatin 40 mg daily.  5. Doxazosin 4 mg daily.  6. Nexium 40 mg b.i.d.  7. Guaifenex 600 mg b.i.d.  8. Actonel.  9. Citrucel.  10.Multivitamin.  11.Fish oil.   REVIEW OF SYSTEMS:  As stated in the HPI and otherwise negative for  other systems.   PHYSICAL EXAMINATION:  The patient is in no distress.  Blood pressure 118/72, heart rate 90 and regular.  HEENT:  Eyelids unremarkable.  Pupils are equal, round, and reactive to  light and accommodation.  Fundi are not visualized.  NECK:  No jugular  venous distension at 45 degrees, carotid upstroke  brisk and symmetric, no bruits, thyromegaly.  LYMPHATICS:  No adenopathy.  LUNGS:  Clear to auscultation bilaterally.  CHEST:  Unremarkable.  HEART:  PMI not displaced or sustained, S1 and S2 within normal limits,  no S3, no S4, no clicks, rubs, murmurs.  ABDOMEN:  Flat, positive bowel sounds, normal in frequency and pitch, no  bruits, rebound, guarding.  No midline pulsatile mass, hepatomegaly,  splenomegaly.  SKIN:  No rashes, no nodules.  EXTREMITIES:  With 2+ pulses throughout, no edema.   EKG shows sinus rhythm, rate 77, axis within normal limits.  RSR prime  V1 and V2.  No acute ST-T wave change.   ASSESSMENT AND PLAN:  1. Coronary artery disease.  The patient had his catheterization as      small vessel disease and nonobstructive large vessel disease.  At      this point, we are going to continue to manage him medically.  He      is in the  TAXUS PERSEUS study.  He will be followed in this trial.  2. Dyslipidemia.  He is having his lipid profile followed by Dr. Christell Constant      with a goal LDL less than 100 and HDL greater than 40.  3. Hypertension.  Blood pressure is well controlled and he will      continue the medications as listed.  4. Followup.  Will see him back in 6 months or sooner if needed.     Rollene Rotunda, MD, [facility]  Electronically Signed    JH/MedQ  DD: 02/15/2007  DT: 02/15/2007  Job #: [id number]   cc:   [name], M.D.

## 2010-07-13 NOTE — Assessment & Plan Note (Signed)
[facility]ALTHCARE                            CARDIOLOGY OFFICE NOTE   [name], [name]                        MRN:          [id number]  DATE:10/11/2006                            DOB:          15-Jan-1933    PRIMARY CARE PHYSICIAN:  Dr. Rudi Heap.   REASON FOR PRESENTATION:  Evaluate patient with coronary disease and  chest discomfort.   HISTORY OF PRESENT ILLNESS:  Patient presents for evaluation of chest  discomfort that he had about 6 weeks ago. This was not a scheduled  followup. However, he had an episode of discomfort and called EMS. They  checked him out and found his vital signs to be ok. EKGs x2 apparently  did not demonstrate any acute changes. He did not actually take  sublingual nitroglycerine. However, the pain dissipated after a couple  of hours. He is not sure whether this is reflux or related to his  coronary disease. He can not tell the difference. It is the same as the  pain he had back in March when he had catheterization demonstrating no  new high grade lesions requiring intervention. He has since not had any  discomfort. He continues to do yard work. He does get fatigued. However,  he also does not sleep well at night. He has not had any new shortness  of breath, though he has some chronic dyspnea with moderate exertion. He  is not having PND or orthopnea. He has not been having palpitations, pre  syncope, or syncope. He has been seen by Dr. [name] recently and is  undergoing evaluation for his dyspnea.   PAST MEDICAL HISTORY:  Coronary artery disease (catheterization February  2008, demonstrated LAD 85% mid stenosis, the right coronary artery at  70% to 80% stenosis in the small caliber PDA. The ejection fraction was  60%. He had a TAXUS stent in LAD. Catheterization again in March  demonstrated the stent to be widely patent. He had a well preserved  ejection fraction.), asthma, chronic obstructive pulmonary disease,  anemia,  diverticula bleed June 2006, colonic polyps, gastroesophageal  reflux disease/esophagitis, prostatic hypertrophy, degenerative joint  disease, osteoporosis.   ALLERGIES:  PENICILLIN.   MEDICATIONS:  1. Aspirin 81 mg daily.  2. Plavix 75 mg daily.  3. Advair 250/50 daily.  4. Pravastatin 40 mg daily.  5. __________ mg daily.  6. Nexium 40 mg b.i.d.  7. Guaifenesin.  8. Actonel.  9. Citrucel.  10.Multivitamin.  11.Fish oil.  12.B12.   REVIEW OF SYSTEMS:  As stated in the HPI and otherwise negative for  other systems.   PHYSICAL EXAMINATION:  Patient is in no distress. Blood pressure 110/68,  heart rate 74 and regular.  HEENT: Eye lids unremarkable, pupils equal, round, and reactive to  light. Fundi not visualized. Oral mucosa unremarkable.  NECK: No jugular venous distension at 45 degrees. Carotid upstroke brisk  and symmetric. No bruits or thyromegaly.  LYMPHATICS: No cervical, axillary, inguinal adenopathy.  LUNGS: Clear to auscultation bilaterally.  BACK: No costovertebral angle tenderness.  CHEST: Unremarkable.  HEART: PMI not displaced or sustained. S1 and S2  within normal limits.  No S3. No S4, clicks, rubs, murmurs.  ABDOMEN: Flat, positive bowel sounds, normal to frequency and pitch. No  bruits, rebound, guarding. No midline pulsatile mass. No hepatomegaly or  splenomegaly.  SKIN: No rashes. No nodules.  EXTREMITIES: 2 + pulses throughout. No edema.  NEURO: Oriented to person, place, and time. Cranial nerves II-XII  grossly intact. Motor grossly intact.   EKG: Sinus rhythm, rate 74, axis within normal limits, intervals within  normal limits. Early transition lead V2. No acute ST-T wave changes.   ASSESSMENT/PLAN:  1. Chest discomfort, the patient's chest discomfort was similar to the      pain he had at the time of his catheterization in March when he had      a patent stent and only small vessel disease. He has not had any of      that discomfort since then.  At this point I do not think further      invasive or non evasive testing would be helpful. We did discuss      the fact that he should take nitroglycerine, which for some reason      he did not do when he had chest pain before. He has had meds      prescribed by Dr. Jarold Motto for possible gastrointestinal      complaints. He should take this as well. He is going to try this if      he has another episode. He also knows that he should call 911 with      any persistent discomfort or recurrent discomfort. He will continue      with secondary risk reduction. I will also continue the Plavix      indefinitely.  2. Dyslipidemia per Dr. Christell Constant. He did have a little bit of leg      discomfort but I encouraged him to continue the pravastatin. The      goal being an LDL less than 100 and an HDL in the 40s.  3. Hypertension, blood pressure is well controlled and he will      continue the medications as listed.  4. Follow up, I will see him back in about 6 months or sooner if he      has any problems.    Rollene Rotunda, MD, [facility]  Electronically Signed   JH/MedQ  DD: 10/11/2006  DT: 10/12/2006  Job #: [id number]   cc:   [name], M.D.

## 2010-07-13 NOTE — Assessment & Plan Note (Signed)
[facility]                            CARDIOLOGY OFFICE NOTE   [name], [name]                        MRN:          [id number]  DATE:07/12/2006                            DOB:          1932-08-13    PRIMARY CARE PHYSICIAN:  Dr. Rudi Heap.   REASON FOR PRESENTATION:  A patient with coronary disease and chest  pain.   HISTORY OF PRESENT ILLNESS:  This patient has coronary disease as  described below.  The last visit he was having some chest discomfort.  However, he has now completed a gastrointestinal workup.  He is said to  have severe reflux and esophagitis.  Since I last talked to him, he has  had no further chest discomfort.  He has had no neck or arm discomfort.  He has had no palpitation, pre-syncope or syncope.  He exercises, and he  brought a list of his speeds on his exercise bike and his heart rates.  Based on this, I have given him specific instructions about how high to  raise his heart rate and at what level he should pedal.   PAST MEDICAL HISTORY:  Coronary artery disease (catheterization in  February with a left anterior descending 85% mid-stenosis, the right  coronary artery at 70 to 80% stenosis, in the posterior descending  artery there a small caliber vessel.  The ejection fraction was 60%.  The LAD had Taxus stent.  He had recatheterization in March and had a  patent stent), well-preserved ejection fraction, asthma, chronic  obstructive pulmonary disease, anemia, diverticular bleed June 2006,  chronic polyps, gastroesophageal reflux disease/esophagitis, prostatic  hypertrophy, degenerative joint disease, osteoporosis, hiatal hernia.   ALLERGIES:  PENICILLIN.   CURRENT MEDICATIONS:  1. Aspirin 325 mg daily.  2. Plavix 75 mg daily.  3. Advair 250/50 daily.  4. Prevacid 40 mg daily.  5. Doxazosin 4 mg daily.  6. Nexium 40 mg b.i.d.  7. Guaifenesin.  8. Actonel.  9. Citracal.  10.Fish oil.  11.Multivitamin.  12.Iron.   REVIEW OF SYSTEMS:  As stated in the HPI otherwise negative for other  systems.   PHYSICAL EXAMINATION:  The patient is in no distress.  Blood pressure is  120/72, heart rate 57 and regular, weight 173 pounds, body mass index  27.  NECK:  No jugular venous distention, 45 degrees.  Carotid upstrokes  brisk and symmetric.  No bruits, no thyromegaly.  LUNGS:  Clear to  auscultation bilaterally.  HEART:  PMI not displaced or sustained.  S1, S2 within normal limits.  No S3, no S4, no clicks, no rubs, no murmurs.  ABDOMEN:  Flat, positive bowel sounds.  Normal in frequency and pitch,  no bruits, no rebound, no guarding, no midline pulsatile masses, no  organomegaly.  SKIN:  No rashes, no nodules.  EXTREMITIES:  2+ pulses, no edema.   ASSESSMENT AND PLAN:  1. Coronary disease.  He is having no new symptoms.  No further      cardiovascular testing is suggested.  We discussed secondary risk      reduction to include exercise as described  above.  He will remain      on his Plavix.  Because of his gastrointestinal problems, he can      reduce his aspirin to 81 mg a day.  2. Dyslipidemia per Dr. Christell Constant.  He will remain on the pravastatin.      The goal is an LDL of less than 100 and HDL in the 40s.  3. Chronic joint pain, osteoarthritis and osteopenia.  The patient      wants to have surgery on his foot.  We have discussed this.  He      will wait at least a year after his intervention to consider this      elective surgery.  4. Followup.  Will see him back in six months or sooner if needed.     Rollene Rotunda, MD, [facility]     JH/MedQ  DD: 07/12/2006  DT: 07/12/2006  Job #: [id number]   cc:   [name], M.D.

## 2010-07-13 NOTE — H&P (Signed)
NAMEOSWALDO, CUETO               ACCOUNT NO.:  [id number]   MEDICAL RECORD NO.:  [id number]          PATIENT TYPE:  OBV   LOCATION:  1312                         FACILITY:  [facility]   PHYSICIAN:  Almond Lint, MD       DATE OF BIRTH:  1932-03-13   DATE OF ADMISSION:  01/08/2008  DATE OF DISCHARGE:                              HISTORY & PHYSICAL   REFERRING PHYSICIAN:  Chriss Driver, M.D., emergency department.   CHIEF COMPLAINT:  Nausea.   HISTORY OF PRESENT ILLNESS:  Mr. Nephew is a 77 year old male with  multiple medical problems who underwent hiatal hernia repair and Nissen  fundoplication on January 04, 2008.  He was discharged yesterday morning  and was doing quite well throughout the day yesterday until he awoke  about 12:30 a.m. with severe retching over and over again.  He also has  been complaining of gas pain with passing significantly more flatus than  normal.  He has been unable to go back to sleep and since coming to the  emergency department has had only one episode of retching.   He denies chest pain or shortness of breath.  His review of systems is  otherwise negative x11 systems.   PAST MEDICAL HISTORY:  1. COPD.  2. Coronary artery disease.  3. Reflux.  4. Hiatal hernia.  5. Hyperlipidemia.  6. Hypothyroidism.  7. Vertigo.  8. Pituitary tumor with normal cortisol levels.   PAST SURGICAL HISTORY:  1. Laparoscopic hiatal hernia and Nissen fundoplication on November 6,      as described above.  2. Bilateral foot reconstructive surgery.  3. Lower abdominal hernia surgery by Dr. Zachery Dakins.   FAMILY HISTORY:  Coronary artery disease.   SOCIAL HISTORY:  He is accompanied by his spouse with whom he lives.   DRUG ALLERGIES:  PENICILLIN.   MEDICATIONS:  1. Aspirin.  2. Cardura.  3. Isosorbide mononitrate.  4. Omeprazole.  5. Plavix.  6. Spiriva.  7. Levothyroxine.  8. Pravastatin.  9. Albuterol.  10.Vitamin B12.  11.Calcium.  12.Actonel.  13.Hyoscyamine.  14.Oxygen.   PHYSICAL EXAMINATION:  Temperature 98, pulse 84, blood pressure 90/53,  respiratory rate 18, 96% on room air.  GENERAL:  He is alert and oriented x3.  Wearing his oxygen in no acute  distress.  HEENT:  Normocephalic and atraumatic.  CHEST:  Lungs are clear to auscultation bilaterally.  No wheezes, rales,  or rhonchi.  HEART:  Normal S1 and S2.  Regular rate and rhythm.  ABDOMEN:  Abdomen is slightly distended.  He has minimal tenderness near  several of his incisions, otherwise nontender.  There is no rebound or  guarding.  There is no erythema or drainage from any of his wounds.  EXTREMITIES:  Warm and well perfused.  SKIN:  No rashes.   LABS:  White count is 7.5, hematocrit 36.1.  Coags are normal.  Electrolytes are essentially within normal limits with a slightly  elevated glucose and slightly decreased CO2 at 21.   Plain films demonstrated a liver with free air over the right liver and  air throughout the colon.  IMPRESSION:  Mr. Hackman is a 77 year old male postoperative from a  hiatal hernia repair and Nissen with significant retching.  His retching  has significantly improved, but given the severity of it, we will admit  him for observation.  He will need aggressive antiemetics.  We will give  him a suppository to have a bowel movement.  Also, we will make sure  that he is hydrated.  He will receive pain control with oxycodone  elixir.  I will discuss the patient with Dr. Daphine Deutscher in the morning to  see if he wants to evaluate the integrity of the wrap with further  imaging.      Almond Lint, MD  Electronically Signed     FB/MEDQ  D:  01/08/2008  T:  01/08/2008  Job:  [id number]

## 2010-07-13 NOTE — Assessment & Plan Note (Signed)
[facility]                            CARDIOLOGY OFFICE NOTE   COLVIN, BLATT                        MRN:          [id number]  DATE:02/06/2008                            DOB:          10/23/1932    PRIMARY CARE PHYSICIAN:  [name], M.D.   REASON FOR PRESENTATION:  This patient with coronary artery disease and  hypotension.   HISTORY OF PRESENT ILLNESS:  The patient presents for followup of the  above.  Since I last saw him, he has done relatively well.  He actually  had hiatal hernia surgery by Dr. Daphine Deutscher and had no significant problems  with this.  He has lost a lot of weight because of the discomfort before  and limitations afterwards.  However, he is starting to eat regular food  now.  He is still not back to full strength.  However, he has had no  substernal chest pressure, neck or arm discomfort.  He has had no  palpitations, presyncope, or syncope.  Denies any PND or orthopnea.  He  is starting to get back to little exercising.  He has had no problems  with low blood pressure as far as he knows.  He stopped taking his blood  pressure as frequently as he was.   PAST MEDICAL HISTORY:  1. Coronary artery disease (see the October 16, 2007, note for      details).  2. Well-preserved ejection fraction.  3. Hypotension.  4. Diverticulosis.  5. Recurrent GI bleeding.  6. Chronic obstructive pulmonary disease.  7. Benign prostatic hypertrophy.  8. Anemia.  9. Chronic polyps.  10.Esophagitis.  11.Degenerative joint disease.  12.Osteoporosis.  13.History of pituitary tumor.  14.Hypothyroidism.  15.Hiatal hernia, now status post repair.   ALLERGIES:  PENICILLIN.   MEDICATIONS:  1. Bromocriptine 5 mg daily.  2. Vitamin D.  3. Multivitamin.  4. Levothyroxine 50 mcg daily.  5. Fish oil.  6. Actonel 150 mg monthly.  7. Aspirin 81 mg daily.  8. Prilosec 40 mg daily.  9. Plavix 75 mg daily.  10.Mucinex.  11.Spiriva.   REVIEW  OF SYSTEMS:  As states in the HPI and otherwise negative for  other systems.   PHYSICAL EXAMINATION:  GENERAL:  The patient is in no distress.  VITAL SIGNS:  Blood pressure 96/56, heart rate 56 and regular, weight  156 pounds, body mass index 24.  NECK:  No jugular venous distension at 45 degrees, carotid upstroke  brisk and symmetrical.  No bruits.  No thyromegaly.  LYMPHATICS:  No cervical, axillary, or inguinal adenopathy.  LUNGS:  Clear to auscultation bilaterally.  CHEST:  Unremarkable.  HEART:  PMI not displaced or sustained.  S1 and S2 are within normal  limits.  No S3, no S4.  No clicks, no rubs, no murmurs.  ABDOMEN:  Flat, positive bowel sounds.  Normal in frequency and pitch.  No bruits, no rebound, no guarding.  No midline pulsatile mass.  No  organomegaly.  EXTREMITIES:  2+ pulses, no edema.   EKG, sinus rhythm, rate 56, axis within normal limits, intervals within  normal limits, no acute ST-T wave changes.   ASSESSMENT AND PLAN:  1. Hypotension.  The patient is not particular bothered by this.  His      blood pressure today in the 90s.  As long as he is not symptomatic      with this, I suggest no change to his medical regimen.  2. Coronary artery disease.  He will continue secondary risk      reduction.  3. Followup.  I will see him back in about 6 months given the low      blood pressure.     Rollene Rotunda, MD, [facility]  Electronically Signed    JH/MedQ  DD: 02/06/2008  DT: 02/07/2008  Job #: [id number]   cc:   [name], M.D.

## 2010-07-13 NOTE — Assessment & Plan Note (Signed)
[facility]                            CARDIOLOGY OFFICE NOTE   TORRENCE, HAMMACK                        MRN:          [id number]  DATE:01/18/2007                            DOB:          [birth year]    PRIMARY CARE PHYSICIAN:  Dr. Rudi Heap   REASON FOR PRESENTATION:  Evaluation patient with coronary disease.   HISTORY OF PRESENT ILLNESS:  The patient presents for followup.  He is  being followed in the research trial (Taxus Peruses).  He has been doing  well since I last saw him.  He has been exercising in [place].  He  does walking and does not develop any chest pain, neck or arm  discomfort.  He has had no palpitations, presyncope or syncope.  He has  had no PND or orthopnea.   He did have one episode of burning chest discomfort about 3-4 weeks ago.  This was similar to what was described in the August note and is  indistinguishable from his GI complaints.  He has not otherwise been  bothered by this.   PAST MEDICAL HISTORY:  1. Coronary artery disease (see the 10/11/2006 note for details.  He      had a Taxus stent to the LAD. Catheterization in March demonstrated      the percent to be widely patent.  Well preserved ejection      fraction).  2. Asthma.  3. Chronic obstructive pulmonary disease.  4. Anemia.  5. Diverticular bleed June, 2006, colonic polyps, gastroesophageal      reflux disease/esophagitis.  6. Prostatic hypertrophy.  7. Degenerative joint disease, osteoporosis.   ALLERGIES:  PENICILLIN.   CURRENT MEDICATIONS:  Aspirin 81 mg daily, Plavix 75 mg daily, Advair  250/50 daily, Pravastatin 40 mg daily, doxazosin 4 mg daily, Nexium 40  mg b.i.d., Guaifenex, Actonel, Citrucel, multivitamins, B-12.   REVIEW OF SYSTEMS:  As stated in the HPI and otherwise negative for  other systems.   PHYSICAL EXAMINATION:  The patient is in no distress.  Blood pressure  120/78, heart rate 82 and regular. Weight 175 pounds, body mass  index  27.  HEENT:  Eyes unremarkable, pupils are equal, round and reactive to  light, fundi not visualized.  NECK:  No jugular venous distension at 45 degrees, carotid upstroke  brisk and symmetric, no bruits, thyromegaly.  LUNGS:  Clear to auscultation bilaterally.  CHEST:  Unremarkable.  HEART:  The PMI is not displaced or sustained, S1 and S2 within normal  limits. No S3, no S4, no clicks, rubs, no murmurs.  ABDOMEN:  Flat, positive bowel sounds, normal in frequency and pitch, no  bruits, rebound, guarding.  No midline pulsatile mass, no hepatomegaly.  SKIN:  No rashes, no nodules.  EXTREMITIES:  Two + pulses, no edema.  NEURO:  Grossly intact.   EKG:  Sinus rhythm, rate 82, axis within normal limits, early transition  lead V2, no acute ST, T wave changes.   ASSESSMENT AND PLAN:  1. Coronary disease.  The patient is having no ongoing symptoms.  No  further cardiovascular testing is suggested. Continue with risk      reduction.  2. Dyslipidemia per Dr. Christell Constant. Goal:  LDL less than 100 and HDL      greater than 40.  3. Hypertension. Blood pressure is well controlled and he will      continue the medications as listed.  4. Followup - I will see him back in 6 months or sooner if needed.     Rollene Rotunda, MD, [facility]  Electronically Signed    JH/MedQ  DD: 01/18/2007  DT: 01/18/2007  Job #: [id number]   cc:   [name], M.D.

## 2010-07-13 NOTE — Discharge Summary (Signed)
NAMEJACADEN, FORBUSH               ACCOUNT NO.:  [id number]   MEDICAL RECORD NO.:  [id number]          PATIENT TYPE:  INP   LOCATION:  1539                         FACILITY:  [facility]   PHYSICIAN:  Thornton Park. Daphine Deutscher, MD  DATE OF BIRTH:  [birth year]   DATE OF ADMISSION:  01/04/2008  DATE OF DISCHARGE:  01/07/2008                               DISCHARGE SUMMARY   ADMITTING DIAGNOSIS:  Large type 3 mixed hiatal hernia.   DISCHARGE DIAGNOSIS:  Large type 3 mixed hiatal hernia.   PROCEDURE:  Laparoscopic takedown of hiatal hernia, repair of diaphragm,  Nissen fundoplication over #56 lighted bougie with endoscopy and  excision of apparent GIST of fundus.   DESCRIPTION OF PROCEDURE:  Raheim Beutler is a 77 year old white male who  came in with the above-mentioned diagnosis.  He underwent this procedure  on Friday, January 04, 2008, from which he did well.  He had an upper GI  which showed a good intact wrap and no evidence of leak.  He was  advanced to clear liquids and was voiding fine with stable vital signs  and lab.  He is ready for discharge and was given dietary instruction.  He will be followed in the office in 3-4 weeks.  The pathology report on  the GIST tumor is still pending and will be managed on an outpatient  basis.   DISCHARGE MEDICATIONS:  Lortab elixir and Phenergan suppositories.      Thornton Park Daphine Deutscher, MD  Electronically Signed     MBM/MEDQ  D:  01/07/2008  T:  01/07/2008  Job:  [id number]   cc:   [name], M.D.  Fax: [phone]   Vania Rea. Jarold Motto, MD, FACG, FACP, FAGA  [address]  [place]  [place] [address]   Clinton D. [name], MD, FCCP, FACP  [facility]  [address]  [place]  [place] [address]

## 2010-07-13 NOTE — Assessment & Plan Note (Signed)
[facility]                            CARDIOLOGY OFFICE NOTE   [name], [name]                        MRN:          [id number]  DATE:10/16/2007                            DOB:          1933/01/09    PRIMARY CARE PHYSICIAN:  [name], MD.   REASON FOR PRESENTATION:  Evaluate the patient with recent  hospitalization for dizziness and hypotension.   HISTORY OF PRESENT ILLNESS:  The patient was hospitalized on September 10, 2007, with an episode of dizziness.  When he presented, he was found to  be somewhat hypotensive.  His initial blood pressure was 79/51.  During  that hospitalization, he did have an echocardiogram, which demonstrated  an EF of 60%.  There were no other significant abnormalities.  He ruled  out for a myocardial infarction.  He did have his cortisol checked,  because of his pituitary problems.  This was within normal limits as was  his thyroid profile.  Prolactin level was normal.  The patient had his  Imdur held.  He was also taken off of his doxazosin for benign prostatic  hypertrophy.   Since going home, he has worn an event monitor.  There were several  transmissions.  I am not sure, how many of these were symptomatic.  However, all of them were sinus rhythm.  There were no arrhythmias  noted.   The patient now returns for followup.  He has had no further severe  episodes.  He has had no presyncope or syncope.  He has had a couple of  light episodes of dizziness.  He is not describing any orthostatic  symptoms.  He has not been having any chest discomfort, neck or arm  discomfort.  He has not been having any PND or orthopnea.  He has  remained relatively active.   PAST MEDICAL HISTORY:  Coronary artery disease (See the August 01, 2007,  note for details.  Status post Taxus stenting to his LAD in 2008.  He  has nonobstructive disease in 3 vessels.  He does have a posterolateral  with 90% stenosis but it was a small vessel  managed medically.  This  catheterization was in December 2008.  He did have a followup stress  perfusion study in May 2009 demonstrating an EF of 68%, no ischemia or  infarct).  Diverticulosis with recurrent GI bleeding, chronic  obstructive pulmonary disease, benign prostatic hypertrophy, anemia,  colonic polyps, esophagitis, prostatic hypertrophy, degenerative joint  disease, osteoporosis, history of a pituitary tumor, hypothyroidism, and  hiatal hernia.   ALLERGIES:  PENICILLIN.   MEDICATIONS:  1. Vitamin D.  2. Multivitamin.  3. Levothyroxine 50 mcg daily, (he is not taking the full dose every      day).  4. Bromocriptine 2.5 mg daily.  5. Fish oil.  6. Actonel.  7. Aspirin 81 mg daily.  8. Prevacid 40 mg daily.  9. Plavix 75 mg daily.  10.Nexium 40 mg every other day.  11.Mucinex.  12.Spiriva 18 mcg daily.   REVIEW OF SYSTEMS:  As stated in the HPI and otherwise negative  for  other systems.   PHYSICAL EXAMINATION:  GENERAL:  The patient is in no distress.  VITAL SIGNS:  Blood pressure 110/66, heart rate 71 and regular, weight  168 pounds, and body mass index is 25.  HEENT:  Eyelids are unremarkable; pupils equal, round, and reactive to  light; fundi not visualized; oral mucosa unremarkable.  NECK:  No jugular venous distention at 45 degrees; carotid upstroke  brisk and symmetrical; no bruits, no thyromegaly.  LYMPHATICS:  No cervical, axillary, or inguinal adenopathy.  LUNGS:  Clear to auscultation bilaterally.  BACK:  No costovertebral angle tenderness.  CHEST:  Unremarkable.  HEART:  PMI not displaced or sustained; S1 and S2 within normal limits;  no S3, no S4; no clicks, no rubs, no murmurs.  ABDOMEN:  Flat; positive bowel sounds; normal in frequency and pitch; no  bruits, no rebound, no guarding or midline pulsatile mass; no  hepatomegaly, no splenomegaly.  SKIN:  No rashes, no nodules.  EXTREMITIES:  Pulses 2+ throughout; no edema, cyanosis, or clubbing.   NEURO:  Oriented to person, place, and time; cranial nerves II-XII  grossly intact; motor grossly intact.   ASSESSMENT AND PLAN:  1. Hypotension.  The patient had hypotension as described.  His blood      pressure had been creeping down.  He has been off of his nitrates      and doxazosin.  He does bring a blood pressure diary today and his      blood pressure is better than it was.  It is in the 100 range, but      it still dips down.  I did not see any 70s or 80s systolic.  He did      not have any obvious endocrinologic etiology to this.  However, I      have asked him to take the blood pressure diary to Dr. Everardo All who      was following him for his pituitary problems.  Those labs were okay      in the hospital, he may want to review these.  2. Coronary artery disease.  He does have some occasional discomfort      in his chest that he thinks it is similar to reflux.  He has had      nothing similar to previous angina.  He has had a catheterization      and now a stress perfusion study this year.  There has been no      evidence of active ischemia.  He will continue with secondary risk      reduction.  No further cardiovascular testing is suggested.  3. Hypothyroidism as above.  4. Dyslipidemia per Dr. Christell Constant with the goal LDL less than 100 and HDL      greater than 40.  5. Preoperative clearance.  The patient would be at acceptable risk      from a cardiovascular standpoint for laparoscopic hernia repair,      and we would like to get this rescheduled with Dr. Daphine Deutscher.  I think      there would be some risk of hypotension, and they may want to check      with Dr. Everardo All about any further suggestions.  This would need to      be watched carefully during surgery.  6. Followup.  I will see him back in about 4 months or sooner, if      needed.      Rollene Rotunda, MD, [facility]  Electronically Signed    JH/MedQ  DD: 10/16/2007  DT: 10/17/2007  Job #: [id number]   cc:   Gregary Signs A. Everardo All,  MD  Thornton Park Daphine Deutscher, MD

## 2010-07-13 NOTE — Op Note (Signed)
NAMEKEONDRICK, DILKS               ACCOUNT NO.:  [id number]   MEDICAL RECORD NO.:  [id number]          PATIENT TYPE:  INP   LOCATION:  0001                         FACILITY:  [facility]   PHYSICIAN:  Thornton Park. Daphine Deutscher, MD  DATE OF BIRTH:  [birth year]   DATE OF PROCEDURE:  01/04/2008  DATE OF DISCHARGE:                               OPERATIVE REPORT   PREOPERATIVE INDICATIONS:  [name] is a 77 year old gentleman  with a large hiatal hernia, managed medically for a while.  He had been  to the emergency room with chest pain and with a negative cardiac  workup.  He has pressure sensations and possibly some aspiration reflux  induced aspiration which maybe has activated his COPD.  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  Informed consent was obtained  regarding laparoscopic as well as open repair of hiatal hernia and  possibly with Nissen fundoplication or gastropexy.   PREOPERATIVE DIAGNOSES:  Type 3 mixed hiatal hernia.   POSTOPERATIVE DIAGNOSIS:  Type 3 mixed hiatal hernia with incidental  finding of a gist tumor along the fundus.   PROCEDURE:  Laparoscopic takedown of hiatal hernia, upper endoscopy (Dr.  Colin Benton), repair of hiatal hernia with three pledgeted sutures posteriorly  and a Nissen fundoplication performed over a #56 lighted bougie.  Excision of just tumor of the fundus.   SURGEON:  Dr. Luretha Murphy   ASSISTANT:  Dr. Colin Benton   ANESTHESIA:  General endotracheal.   ESTIMATED BLOOD LOSS:  Minimal.   DESCRIPTION OF PROCEDURE:  This 77 year old white male was taken to room  1.  He was prepped with chlorhexidine Chloraprep and draped in a sterile  fashion.  Access to the abdomen was gained to the left upper quadrant  using OptiVu 0 degree technique and this was done without difficulty.  Abdomen was insufflated.  Once entered, I put standard trocar placements  and focused my attention to the foregut.  With a Nathanson retractor  beneath the left lateral  segment,  I exposed a fairly large hiatus  with  a portion of the stomach above the diaphragm.  The hiatus was exposed.  We then incised the gastrohepatic window, took this up and took down  along the right crus of the sac and carried this anteriorly, and  resected the sac.  On the fundus side, I went ahead and started down on  the short gastrics and began going up and then went down a little bit  lower and, with serendipity, saw a little nodule that looked like an NG  tube displacing the fundus.  On further view, this was a mass and this  was appreciated and it was along the greater curvature.  We put a stitch  through this base and with a silk suture in place I held up and then  stapled beneath it with two applications of a Covidien 6 cm stapler,  using a blue load.  No bleeding was seen.  This was put in a bag and  brought out and sent for frozen section, which Dr. Luisa Hart felt this was  a gist tumor with adequate margin resection.   I then continued up the greater curvature, taking that down, and went up  into the chest and freeing the stomach and then I mobilized the distal  esophagus.  When I did that, Dr. Colin Benton then went up and endoscoped the  patient.  There were some insufflation problems just getting  more  pressure, but we got adequate pressure to see the esophagogastric  junction and this appeared to be below the diaphragm with a good margin.  The hiatus was then repaired posteriorly with interrupted sutures using  pledgets.  He had good tissue that I got purchase of and after three  such sutures there appeared to be good  closure of the hiatus  posteriorly.  The stomach was sufficiently mobile and we were able to  pass the lighted 56 bougie easily.  With that in place, the stomach  wrapped around easily and completely occupied the diaphragmatic opening.  I did not feel there was a patch needed or any further closure of the  diaphragm.   A Nissen fundoplication was then  performed with a 56 lighted bougie in  place with three sutures, the top and bottom sutures held in place with  tie knot and the middle suture tied.  I failed to mention in the hiatus  closure that the three sutures with pledgets were secured with tie  knots.  There was no evidence of bleeding.  Everything looked good.  I  injected some Tisseel beneath the wrap.  In doing the wrap, I got good  bites to try to obliterate the space anteriorly to prevent any kind of  slippage, but also inserted some Tisseel..  The wounds were inspected as  I withdrew the cystoscope.  I injected all port sites with Marcaine and  I closed the 10 and one 12  trocar sites with 0 Vicryl.  4-0 Vicryl was  used to approximate the skin, along with Benzoin and Steri-Strips.  The  patient tolerated procedure well and was taken to recovery room in  satisfactory condition.      Thornton Park Daphine Deutscher, MD  Electronically Signed     MBM/MEDQ  D:  01/04/2008  T:  01/04/2008  Job:  [id number]   cc:   Vania Rea. Jarold Motto, MD, FACG, FACP, FAGA  [address]  [place]  [place] [address]   Clinton D. [name], MD, FCCP, FACP  [facility]  [address]  [place]  [place] [address]   [name], M.D.  Fax: [phone]   Dr. Lindaann Pascal

## 2010-07-13 NOTE — H&P (Signed)
[name], [name]               ACCOUNT NO.:  [id number]   MEDICAL RECORD NO.:  [id number]          PATIENT TYPE:  INP   LOCATION:  2912                         FACILITY:  [facility]   PHYSICIAN:  [name]. Allen Kill, MD, FACCDATE OF BIRTH:  04-30-1932   DATE OF ADMISSION:  09/10/2007  DATE OF DISCHARGE:                              HISTORY & PHYSICAL   PRIMARY CARE PHYSICIAN:  [name], M.D. at [facility].   PRIMARY ENDOCRINOLOGIST:  [name]. Allen All, MD.   PRIMARY PULMONOLOGIST:  Allen Duhamel, MD, FCCP, FACP.   PRIMARY CARDIOLOGIST:  Allen Rotunda, MD, [facility].   CHIEF COMPLAINT:  Weakness and presyncope.   HISTORY OF PRESENT ILLNESS:  [name] is a 77 year old male with  known coronary artery disease.  He has been noted to have a systolic  blood pressures in the 80s recently, but has denied symptoms.  Today, he  was at the barber shop in his usual state of health and had sudden onset  of presyncope.  He denies dizziness.  He stated he felt weak and short  of breath.  He felt he was shaky and pale as well as diaphoretic.  He  did not have pain.  He felt like he was blacking out, but stated he did  not completely lose consciousness.  His wife gave him a sublingual  nitroglycerin and some O2.  EMS was called and they gave him IV fluids.  In the emergency room after about a liter and a half, his systolic blood  pressure is variable, between 85 systolic and 107 systolic.   Today, [name] had taken his usual morning medications, which  include Imdur 30 mg half a tablet, Mucinex, and Nexium as well as baby  aspirin, but had not eaten.  Of note, when he was seen by Dr. Antoine [name]  on August 01, 2007, his blood pressure was 88 systolic in the  anesthesiologist's office.  Last Thursday, his blood pressure was 80/56.  No medication changes were made because he was asymptomatic.  Currently,  he is resting comfortably and denies symptoms, although, he  does  complain of some dizziness when changing position.   PAST MEDICAL HISTORY:  1. Taxus DES as part of pursue study in February of 2008, to the LAD.  2. Pituitary tumor, approximately 12 mm and consistent with pituitary      microadenoma per the MRI.  3. Status post cardiac catheterization in December of 2008, showing      left main 30%, LAD 40 to 50%, stent patent, first diagonal mild-      moderate disease, circumflex 80%, OM 60%, RCA 60%, PDA 90%, and      medical therapy recommended.  4. History of preserved left ventricular function with an EF of 60%      and cath in February of 2008.  5. Hypertension.  6. Hyperlipidemia.  7. Family history of coronary artery disease.  8. Degenerative joint disease and osteoporosis.  9. COPD.  10.Anemia.  11.BPH.  12.Hypothyroidism.  13.Gastroesophageal reflux disease/hiatal hernia.  14.History of colon polyps.  15.History of  diverticular bleed remotely.   SURGICAL HISTORY:  He is status post cardiac catheterizations as well as  bilateral foot surgery, right hernia repair, EGD, and colonoscopy.   ALLERGIES:  PENICILLIN.   CURRENT MEDICATIONS:  1. Imdur 30 mg, 1 tablet daily.  2. Spiriva daily.  3. Mucinex 600 mg b.i.d.  4. Nexium 40 mg or Capadex 60 mg q.a.m. with Nexium 40 mg q.p.m.  5. Plavix 75 mg is on hold for planned Nissen fundoplication on September 12, 2007.  6. Aspirin 81 mg daily.  7. Actonel once a month.  8. Doxazosin 4 mg q.h.s.  9. Levothyroxine 50 mcg daily.  10.Bromocriptine 5 mg daily.   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  He used to smoke occasionally and  smokes a pipe, but quit 25 years ago.  He has no history of alcohol or  drug abuse.   FAMILY HISTORY:  His mother died at age 57 of childbirth complications  when the patient was 35-week-old.  His father died at age 74 with a  history of heart failure possibly coronary artery  disease and he has 1  brother with coronary artery disease.   REVIEW OF SYSTEMS:  His p.o. intake has been relatively poor lately  because of the reflux symptoms and symptoms from the hiatal hernia.  His  weight is decreased approximately 6-8 pounds in the last few weeks.  He  has chronic dyspnea on exertion, which he feels is slightly worse this  year.  He also has some daily coughing and wheezing, which is worse in  the evening.  The presyncope today with his first episode.  He has  chronic urinary frequency because of his prostate.  He also notes some  nocturia.  He denies dysuria, but states his urine has been a darker  color than usual lately.  He has occasional orthostatic dizziness, more,  during the night.  He had some chronic arthralgias, but these are not  worse than usual.  He has not noted any hematemesis or melena.  Full 14-  point review of systems is otherwise negative.   PHYSICAL EXAMINATION:  VITAL SIGNS:  Temperature is 96.9, blood pressure  initially was 79/51, now 95/55 with heart rate of 57, respiratory 20,  and O2 saturation 98% on 2 liters.  Lying blood pressure 87/86, heart  rate 52, sitting blood pressure 94/57, with a heart rate of 62 and  standing blood pressure of 95/55 with a heart rate of 73.  GENERAL:  He is a well-developed, well-nourished white male in no acute  distress at rest.  HEENT:  Normal.  NECK:  There is no lymphadenopathy, thyromegaly, bruit, or JVD noted.  CVA:  His heart is regular in rate and rhythm with an S1 and S2 and no  significant murmur, rub, or gallop is noted.  Distal pulses are intact  in [name] 4 extremities and no femoral bruits are appreciated.  LUNGS:  He has a few rhonchi and rales, but the rhonchi is clear with  cough.  SKIN:  No rashes or lesions are noted.  ABDOMEN:  Soft and nontender with active bowel sounds and no  hepatosplenomegaly.  EXTREMITIES:  There is no cyanosis, clubbing, or edema noted.  MUSCULOSKELETAL:   There is no joint deformity or effusions and no spine  or CVA tenderness.  NEUROLOGIC:  He is alert and oriented.  Cranial nerves  2 through 12  grossly intact.   Chest x-ray, no acute disease.   EKG, sinus rhythm rate 60 with no acute ischemic changes.   LABORATORY VALUES:  Hemoglobin 10.6, hematocrit 31.3, WBC 7.3, and  platelets 175, sodium 141, potassium 4.1, chloride 108, BUN 11,  creatinine 1.1, glucose 90, point-of-care markers negative x1, and INR  1.1.   IMPRESSION:  1. Hypotension:  This is possibly multifactorial.  Consideration is      given to the pituitary tumor as well as his history of      hypothyroidism in conjunction with the Imdur and doxazosin.  We      will admit him to the cardiac care unit for close monitoring.  We      will check a random serum cortisol level as well as a TSH and free      T3 and T4.  Dr. Felicity Coyer is on call for Dr. Everardo [name] and has been      requested to see him.  Further evaluation and treatment will depend      on his response to fluid hydration and the results of the above      testing.  2. History of coronary artery disease:  He is currently having no      ischemic symptoms and will be continued on aspirin.  We will follow      his symptoms closely while he is off the Imdur.  3. Anemia:  This is not a new problem.  His hemoglobin is      approximately what it was when it was checked in March of 2009.      Further evaluation of this will be up to primary care.  4. Hiatal hernia/gastroesophageal reflux disease:  Dr. Ermalene Searing office      has been made aware that the patient is being admitted to the      hospital and may not be appropriate for surgery in 2 days.  5. Chronic obstructive pulmonary disease:  Mr. Dorian will be      continued on his home medications.      Theodore Demark, PA-C      [name]. Allen Kill, MD, [facility]  Electronically Signed    RB/MEDQ  D:  09/10/2007  T:  09/11/2007  Job:  [id number]

## 2010-07-13 NOTE — Assessment & Plan Note (Signed)
HEALTHCARE                         GASTROENTEROLOGY OFFICE NOTE   [name], [name]                        MRN:          [id number]  DATE:06/05/2007                            DOB:          30-Aug-1932    [name] is a 77 year old white male that I followed for many years  with chronic GERD and known large hiatal hernia confirmed by recent  endoscopy and upper GI series in the last couple of years.  He has been  managed with twice a day PPI therapy but continues to have episodes of  reflux and esophageal spasm which responded in the past to p.r.n.  nitroglycerin and Levsin.   He has recently been in the emergency room with increase in chest pain,  has had negative cardiac evaluations.  He is referred for a repeat GI  evaluation.  He denies any hepatobiliary complaints, his appetite is  good and his weight is stable, and he had a negative abdominal  ultrasound last year.  He denies dysphagia, significant abdominal pain  or bowel irregularity, melena or hematochezia.  Most of his problems are  recurrent.  Left precordial chest pain several times a day without  typical reflux symptoms.   PAST MEDICAL HISTORY:  Remarkable for:  1. Diverticulosis with recurrent low GI bleeding.  2. Chronic obstructive pulmonary disease.  3. Nonobstructive coronary disease.  4. BPH.  5. Degenerative joint disease.   MEDICATIONS:  He is on a multitude of medication listed  and reviewed in  his chart which include:  1. Nexium 40 mg in the morning.  2. Kapidex 60 mg at night.  3. Aspirin 81 mg a day.  4. Monthly Actonel.  5. Also...  On a variety of inhalers and pulmonary medicines and also Isorbid 30 mg  a day, as well as bought a lot of vitamin and mineral supplementations.   Does have a history of PENICILLIN allergy.   LAB AND ER REVIEW:  [name] was recently in the emergency room at [facility] and had a CT of the head and chest which showed a 13 mm  pituitary adenoma.  He has an appointment to see Dr. Channing Mutters, in  neurosurgical consultation.  His wife relates that his TSH and LH levels  are elevated, although I do not have these labs for review.  Other x-  rays have shown moderate spondylosis and facet disease of the cervical  spine, COPD changes in his lungs, and also on CT scan a large hiatal  hernia and diffuse vascular atherogenesis.   PERTINENT LABORATORY DATA:  Shows normal CBC and metabolic profile.   EXAM:  He is a healthy-appearing white male in no distress appearing his  stated age.  He weighs  177 pounds.  Blood pressure is 106/70 and pulse  was 80 and regular.  I could not appreciate stigmata of chronic liver  disease.  CHEST:  Clear and I could not appreciate wheezes or rhonchi.  He was in  a regular rhythm without murmurs, gallops or rubs.  ABDOMEN:  Show no organomegaly, masses or tenderness.  Bowel sounds were  normal.  Mental status was clear.  Rectal exam deferred.  PERIPHERAL:  Extremities were unremarkable.   ASSESSMENT:  1. Chest pain probably related to large hiatal hernia with perhaps an      element of mechanical twisting or obstruction in his hiatal hernia.      This was confirmed by CT scan, endoscopy, and also barium swallow      performed on May 19, 2006, at [facility].  2. Constipation, predominate irritable bowel syndrome and      diverticulosis.  3. Coronary artery disease with previous coronary artery stents.  4. Rather marked chronic obstructive lung disease with asthmatic      bronchitis.  5. History of benign prostatic hypertrophy.  6. History of degenerative arthritis.   RECOMMENDATIONS:  1. Reflux regime reviewed, will continue twice a day PPI therapy with      p.r.n. Carafate suspension use.  2. Patient has neurosurgical work up pending.  3. Despitehis multiple medical problems, I think he probably will need      hiatal hernia repair and this can be performed laparoscopically  by      Dr. Daphine Deutscher.  4. Continue  with other multiple  medications per multiple physicians.     Vania Rea. Jarold Motto, MD, [facility], FAGA  Electronically Signed    DRP/MedQ  DD: 06/05/2007  DT: 06/05/2007  Job #: [id number]   cc:   Payton Doughty, M.D.  Thornton Park Daphine Deutscher, MD  [name], M.D.  Rollene Rotunda, MD, [facility]

## 2010-07-13 NOTE — Assessment & Plan Note (Signed)
[facility] HEALTHCARE                            CARDIOLOGY OFFICE NOTE   CREGG, JUTTE                        MRN:          [id number]  DATE:05/16/2007                            DOB:          August 05, 1932    PRIMARY CARE PHYSICIAN:  [name], M.D.   REASON FOR PRESENTATION:  Evaluate patient with coronary disease and  chest pain.   HISTORY OF PRESENT ILLNESS:  The patient presents for evaluation of  chest discomfort.  He was referred back by Dr. Christell Constant.  He has been  getting the discomfort now every several days.  It happens if he becomes  emotionally stressed.  He may have some left sternal discomfort.  It is  just a very small area in the substernal.  It is an aching discomfort.  It happened when he was upset about his daughter's water heater  recently.  He also has had little bit stinging discomfort in a small  area left of his sternum.  He is not sure that this is similar to his  previous angina.  Certainly not as severe as the burning he had at the  time of catheterization in the past.  He did have a follow-up  catheterization in December of 2008 that demonstrated a patent stent in  his LAD, but he did have small vessel disease.  He has had some  shortness of breath, for instance, once when he was recently using a  chainsaw.  Otherwise, he is able to do some things like exercise at  [facility] without necessarily bringing on symptoms.  He did one-time  have some chest discomfort recently while walking on a treadmill.  This  was very mild and it did not make him stop what he was doing.  One-time  he took a  hyoscyamine with this chest discomfort when he had some  emotional stress.  The discomfort went away.  He is not taking any  nitroglycerin.   PAST MEDICAL HISTORY:  1. Coronary artery disease (status post stenting of his LAD by Dr.      Excell Seltzer in 2008).  2. Catheterization in December 2008 demonstrated the LAD to be widely      patent.   He had a preserved ejection fraction.  He had a right      coronary artery with a high-grade PDA lesion and a small vessel.  3. Asthma.  4. Chronic obstructive pulmonary disease.  5. Anemia.  6. Diverticular bleed in June 2006.  7. Colonic polyps.  8. Gastroesophageal reflux disease.  9. Esophagitis.  10.Prostatic hypertrophy.  11.Degenerative joint disease.  12.Osteoporosis.   ALLERGIES:  PENICILLIN.   MEDICATIONS:  1. Aspirin 81 mg daily.  2. Plavix 75 mg daily.  3. Pravastatin 40 mg daily.  4. Doxazosin 4 mg daily.  5. Nexium 40 mg b.i.d.  6. Guaifenex 16 mg b.i.d.  7. Citracal.  8. Multivitamin.  9. Fish oil.  10.Spiriva.  11.Actonel.  12.Coenzyme Q.   REVIEW OF SYSTEMS:  As stated in the HPI and otherwise negative for  other systems.   PHYSICAL EXAMINATION:  GENERAL:  The patient is in no distress.  VITAL SIGNS:  Blood pressure 100/58, heart rate 72 and regular, weight  182 pounds, body mass index 29.  HEENT:  Eyelids are unremarkable.  Pupils equal, round, reactive to  light.  Fundi not visualized.  Oral mucosa unremarkable.  NECK:  No jugular distention at 45 degrees, carotid upstroke brisk and  symmetrical.  No bruits, thyromegaly.  LYMPHATICS:  No cervical, axillary, inguinal adenopathy.  LUNGS:  Clear to auscultation bilaterally.  BACK:  No costovertebral angle tenderness.  CHEST:  Unremarkable.  HEART:  PMI not displaced or sustained.  S1-S2 within normal limits.  No  S3-S4, no clicks, rubs, murmurs.  ABDOMEN:  Flat, positive bowel sounds normal in frequency and pitch, no  bruits, rebound, guarding or midline pulsatile mass, hepatomegaly.  SKIN:  No rash.  UPPER EXTREMITIES:  Pulses 2+, no edema.   STUDIES:  EKG sinus rhythm, rate 72, axis within normal limits, early  transition lead V2, intervals within normal limits, no acute ST-wave  changes.   ASSESSMENT/PLAN:  1. Chest pain.  The patient is having some chest pain.  It is unclear      whether  this is GI I or could be related to his coronaries.  If it      is cardiac, it is probably related to the small vessels, and I      would manage this medically.  I do not think further study with a      stress perfusion study or cardiac catheterization is warranted.  At      this point, I will add Imdur 30 mg daily.  He is to let me know if      he has any increasing symptoms.  He is also to see Dr. Eloise Harman to      evaluate possible GI etiologies.  2. COPD, asthma.  He is to have evaluation of this as directed by Dr.      Christell Constant as he has had some mild shortness of breath.  3. Dyslipidemia is followed by Dr. Christell Constant.  The goals:  LDL less than      100, HDL greater than 40.  4. Hypertension.  Blood pressure is well-controlled and he will      continue the medications as listed.  5. Degenerative joint disease.  Apparently, he is getting an MRI      because he is having some      neck discomfort.  6. Follow-up.  I will see him back in about 3 months or sooner if he      has any issues.     Rollene Rotunda, MD, [facility]  Electronically Signed    JH/MedQ  DD: 05/16/2007  DT: 05/16/2007  Job #: [id number]   cc:   [name], M.D.

## 2010-07-13 NOTE — Assessment & Plan Note (Signed)
[facility]                            CARDIOLOGY OFFICE NOTE   DEQUINCY, BORN                        MRN:          [id number]  DATE:08/01/2007                            DOB:          1932/08/10    PRIMARY CARE PHYSICIAN:  [name], M.D.   REASON FOR PRESENTATION:  Coronary disease and chest pain.   HISTORY OF PRESENT ILLNESS:  The patient returns for followup of the  above.  He is 77 years old.  He is actually being seen now by Dr. Wenda Low and is being considered for laparoscopic repair of hiatal hernia.  This is planned for July.  He does get discomfort in his chest and  feelings of  dysphasia.  He has also had other chest discomfort.  He had  an episode of chest discomfort after weeding.  This was substernal and  similar to previous angina.  This was about 3 weeks ago.  He actually  called the paramedics.  He wore some oxygen when this happened.  Since  that time he has had a stress perfusion study in our office that  demonstrated an EF of 68% with no NSAID, no evidence of ischemia or  infarct.  Since that time the patient has had no severe chest  discomfort.  He sometimes gets short of breath at rest.  He thinks it is  related to taking his half dose of Imdur.  He does not, however,  describe PND or orthopnea.  He denies any  palpitation, presyncope or  syncope.  His chest pain is not reproducible but has been somewhat  sporadic.  He is able to do some activity without bringing on any of his  symptoms.   PAST MEDICAL HISTORY:  1. Coronary artery disease.  Taxus stenting to his LAD in February      2008.  Most recent catheterization, December 2008, demonstrated      left main 30% stenosis.  The LAD had 40-50% stenosis in the      proximal portion, was a stent following this which was widely      patent.  The circumflex had an 80% proximal stenosis.  There was a      60% lesion in the mid portion of the OM.  PDA was free of  critical      disease.  The right coronary artery was nondominant.  There was      proximal 56% stenosis and mid 60-70% stenosis.  The  posterolateral      had 90% stenosis.  This was all felt to be small vessel and was      managed medically.  2. Diverticulosis with recurrent lower GI bleeding.  3. Chronic obstructive pulmonary disease.  4. Benign prostatic hypertrophy.  5. Anemia.  6. Colonic polyps.  7. Esophagitis.  8. Prostatic hypertrophy.  9. Degenerative joint disease.  10.Osteoporosis.  11.Pituitary chemotherapy being followed by Dr. Everardo All.  12.Hypothyroidism.   ALLERGIES:  PENICILLIN.   MEDICATIONS:  1. Spiriva 18 mcg daily.  2. Mucinex.  3. Nexium 40 mg every other day.  4. Plavix 75 mg daily.  5. Pravastatin 40 mg daily.  6. Aspirin 81 mg daily.  7. Actonel.  8. Doxazosin.  9. Isosorbide 15 mg daily.  10.Citracal.  11.Fish oil.  12.Vitamin D.  13.Coenzyme Q.  14.Kapidex 60 mg every other day.  15.Bromocriptine.  16  Levothyroxine 50 mcg daily.   REVIEW OF SYSTEMS:  Review of systems as stated in HPI; otherwise for  other systems.   PHYSICAL EXAMINATION:  GENERAL:  The patient is in no distress.  VITAL SIGNS:  Blood pressure 88/70, heart rate 69 and irregular, weight  178 pounds, body mass index 27.  HEENT:  Pupils equal and reactive.  Fundi not visualized.  Oral mucosa  normal.  NECK:  No jugular distention at 45 degrees.  Carotid upstroke brisk and  symmetrical.  No bruits.  No thyromegaly.  LYMPHATICS:  No cervical, axillary, inguinal adenopathy.  LUNGS:  Clear to auscultation bilaterally.  BACK:  No costovertebral mass.  CHEST:  Normal.  HEART:  PMI not displaced or sustained.  S1 and S2 within normal limits.  No S3, S4.  No clicks, rubs, murmurs.  ABDOMEN:  Flat.  Positive bowel sounds, normal in frequency and pitch.  No bruits, rebound or guarding or midline pulsatile mass.  No  hepatomegaly.  No splenomegaly.  SKIN:  No rashes, no nodules.   EXTREMITIES:  2+ pulses throughout.  No edema, cyanosis or clubbing.  NEURO:  Oriented to place, time.  Cranial nerves II-XII grossly intact.  Motor grossly intact.   The EKG shows sinus rhythm, rate 69, axis within normal limits, early  transition lead V2, no acute ST-wave changes.   ASSESSMENT/PLAN:  1. Coronary disease.  The patient has coronary disease as described.      Small-vessel disease.  We are managing him medically.  He had a      negative stress perfusion study last month.  He may be getting some      angina.  However, it is difficult to sort out how many of his      symptoms may be related to his GI problems as well.  For now he is      going to continue the medicines as listed and take sublingual      nitroglycerin if he has any significant chest discomfort.  He is at      acceptable risk for the planned procedure as described below.  2. Preoperative clearance.  The patient will be acceptable risk for      surgery based on ACC/AHA guidelines.  No further cardiovascular      testing is suggested.  He should the medications as listed.  He can      discontinue his Plavix 45 days prior to this.  I would like him to      restart it when possible after surgery.  I would like him to      continue at least an 81 mg aspirin through surgery unless this is      absolutely contraindicated.  3. Dyslipidemia.  The patient will continue with medications as      listed.  His lipids are followed by Dr. Christell Constant.  Goals are LDL less      than 100 and HDL greater than 40.  4. Hypotension.  His blood pressure is running a little low which has      been unusual.  Will keep an eye on this.  He is really not on any  blood pressure medications, and I do not know whether this could be      related to his pituitary problems.  He is not particularly      symptomatic from this at this point.  The scope above under on      physical exam put make sure that the EKG.     Rollene Rotunda, MD, [facility]   Electronically Signed    JH/MedQ  DD: 08/01/2007  DT: 08/01/2007  Job #: [id number]   cc:   [name], M.D.  [place] Daphine Deutscher, MD

## 2010-07-16 NOTE — Op Note (Signed)
[name], [name]               ACCOUNT NO.:  [id number]   MEDICAL RECORD NO.:  [id number]          PATIENT TYPE:  AMB   LOCATION:  DAY                          FACILITY:  [facility]   PHYSICIAN:  Anselm Pancoast. Weatherly, M.D.DATE OF BIRTH:  November 11, 1932   DATE OF PROCEDURE:  03/16/2005  DATE OF DISCHARGE:                                 OPERATIVE REPORT   PREOPERATIVE DIAGNOSES:  Right inguinal hernia.   POSTOPERATIVE DIAGNOSES:  Right inguinal hernia, indirect.   OPERATION:  Right inguinal herniorrhaphy with mesh.   ANESTHESIA:  Local anesthesia with sedation.   HISTORY:  [name] is a 77 year old male who was referred to me by the  [facility] group where he had seen one of Dr. Roe Coombs Moore's  associates, Lorin Picket Long actually had seen him, and he had a right inguinal  hernia that was quite symptomatic after he was hanging some doors. He does  odd carpenter jobs and on exam, the bulge is not that large unless he would  stand up and strain real hard and then it would prolapse out a hernia that  very likely is indirect. He is 25 and has a history of difficulty voiding  and is on medications for __________ .  I recommended that we repair his  hernia with a local and sedation but keep him overnight since he lives in  [place]  if he were having difficulty voiding and the difficult trip back.  He is here today for the planned procedure. His hernia is not out today, it  was marked. He said yesterday he was quite symptomatic, but then as long as  he kind of lays down and does not do anything it is not bothersome. His  laboratory studies were normal preoperatively with a white count of 6100 and  his  BUN is 11 with a creatinine __________ .  The patient was taken to the  operative suite. The right groin was shaved and prepped with Betadine  solution and then draped in a sterile manner. The incision area was  filtrated with a mixture of 0.5% Xylocaine with 0.25 Marcaine with  adrenalin, all total about 40 mL of solution used. The ilioinguinal nerve  area was anesthetized with about 10 mL using a  floor 22 gauge needle. Sharp  dissection in the skin and subcutaneous tissue, he has got a little bit of  excessive fatty tissue and then the superficial veins were clamped, divided  and ligated with interrupted sutures of 4-0 Vicryl. The external oblique  aponeurosis was opened, the ilioinguinal nerve was identified and protected  and the cord structures were elevated with a Penrose. He has got a kind of a  weakened floor but this is obviously an indirect hernia and I dissected out  a hernia sac about 4 inches in length that is fairly thin, recent onset and  divided it after a high sac ligation with 2-0 Vicryl and placed a second tie  just distal. The patient has a lot of kind of fatty tissue that was  prolapsed down out and this was reduced back up into the  abdomen and then  the floor was closed with a running 2-0 Prolene sort of a modified  __________  taking the two ends back and this reinforces the internal ring.  Next a piece of mesh shaped like a sail slit laterally was used to reinforce  the floor. The iliohypogajstric nerves were elevated and the mesh was placed  under it and hopefully did not have any excessive tension with the mesh as  they kind of go a little bit more superiorly. Starting at the symphysis  pubis suturing the mesh with a running 2-0 Prolene and then the two tails  sutured together laterally. The internal ring was checked and did not appear  to be tight where the mesh goes around the internal ring and then the cord  structures were placed back into their normal position. The nerves were  lying comfortably and the external oblique was closed with a running 3-0  Vicryl, Scarpa's fascia closed with interrupted 3-0 Vicryl, 4-0 Dexon  subcuticular, Benzoin and Steri-Strips on the skin. The patient was awake at  the  completion of surgery and will  spend the night at the short stay and  hopefully be able to be released in the morning. I will see him before he is  released in the morning and hopefully he will not have to be catheterized.  If he would have difficulty voiding will probably just leave a Foley and  take  it out in about 3 days.           ______________________________  Anselm Pancoast. Zachery Dakins, M.D.     WJW/MEDQ  D:  03/16/2005  T:  03/16/2005  Job:  [id number]   cc:   [name], M.D.  Fax: [phone]

## 2010-07-16 NOTE — H&P (Signed)
[name], CONELY NO.:  [id number]   MEDICAL RECORD NO.:  [id number]          PATIENT TYPE:  EMS   LOCATION:  MAJO                         FACILITY:  [facility]   PHYSICIAN:  Salvadore Farber, MD  DATE OF BIRTH:  02/13/1933   DATE OF ADMISSION:  04/25/2006  DATE OF DISCHARGE:                              HISTORY & PHYSICAL   PRIMARY CARE PHYSICIAN:  [name], M.D. in [place], [place].   CARDIOLOGIST:  Rollene Rotunda, MD, [facility]   CHIEF COMPLAINT:  Chest pain.   HISTORY OF PRESENT ILLNESS:  [name] is a 77 year old male with a  remote history of cardiac catheterization that showed nonobstructive  disease.  He was recently seen in routine followup by Dr. Antoine Poche but  mentioned some chest pain.  For this he had an adenosine Myoview which  showed no ischemia and an EF of 67%.  He was seen by Dr. Christell Constant as well  after this.  He had two episodes of chest pain that woke him in the  night.  They each resolved in about 15 minutes without any medical  therapy.  The third episode of chest pain woke him at approximately 3  o'clock this morning.  He had been told by Dr. Christell Constant to call 9-1-1 and  go to the emergency room if he had recurrent symptoms, so he did.  His  chest pain lasted about 45 minutes.  It resolved by the time EMS  arrived, so no nitroglycerin was given.  He did not try any medications  except aspirin.  He also reports exertional chest pain which will  started in about 2 minutes on his exercise bike or while walking to the  mailbox.  He notes that this has started in the last few weeks.  It  resolves with rest.  He is currently symptom-free.   PAST MEDICAL HISTORY:  1. Status post cardiac catheterization in 1989 with no significant      disease.  2. Status post Myoview in 2004 that showed a possible reversible      inferior defect but was a low risk study with an EF of 61%.  3. Asthma/COPD/chronic bronchitis.  4. History of anemia.  5. History of diverticular bleed in June of 2007.  6. History of colon polyps.  7. History of gastroesophageal reflux disease symptoms.  8. History of BPH.  9. History of DJD.  10.History of osteoporosis.  11.Family history of coronary artery disease.   SURGICAL HISTORY:  1. Status post remote cardiac catheterization.  2. Colonoscopy.  3. EGD.  4. Right foot surgery x2.  5. Left foot surgery.  6. Right inguinal hernia repair.   ALLERGIES:  PENICILLIN.   CURRENT MEDICATIONS:  1. Advair 250/50, one puff daily.  2. Albuterol MDI p.r.n. (uses rarely).  3. Singulair p.r.n.  4. Guaifenex 600 mg b.i.d.  5. Doxazosin 4 mg a day.  6. Actonel every week.  7. Nexium 40 mg or Prilosec 20 mg b.i.d.  8. Citracal plus D daily.  9. Fish oil 1000 mg daily.  10.Aspirin 81 mg a day.  11.Multivitamin and iron supplement daily.   SOCIAL HISTORY:  He lives in [place] with his wife.  He is the  retired Paramedic for [employer].  He has no history of EtOH or  drug abuse.  He has smoked a pipe rarely in the remote past but has not  used tobacco in greater than 25 years.   FAMILY HISTORY:  His mother died at age 69 from childbirth complications  when the patient was 74 weeks old.  His father died at age 49 with a  history of congestive heart failure and described hardening of the  arteries.  He has one brother that is alive but had coronary artery  disease first diagnosed in his 5s, and he had one brother that died of  sudden death while horseback riding.  The patient was told it was an  aneurysm but no autopsy was done.  He had two sisters that died of heart  failure, but they were both in their 87s.   REVIEW OF SYSTEMS:  He has chronic dyspnea on exertion but does not feel  this is changed recently.  He coughs occasionally and occasionally  coughs up a whitish sputum.  He rarely wheezes.  He has chronic  arthralgias and joint pains and got a cortisone shot and is left  foot  yesterday.  His chest pain at times has been felt to be reflux, and  because of this, he is on the b.i.d. proton pump inhibitors.  He has no  recent fevers or chills and no melena since June.  Review of systems is  otherwise negative.   PHYSICAL EXAMINATION:  VITAL SIGNS:  Temperature is 97.5, blood pressure  118/74, pulse 62, respiratory rate 16, O2 saturation 99% on room air.  GENERAL:  He is a well-developed elderly white male in no acute  distress.  HEENT:  Normocephalic and atraumatic with extraocular movements intact.  Sclera clear.  Nares without discharge.  NECK:  There is no lymphadenopathy, thyromegaly or JVD noted.  CV:  His heart is regular in rate and rhythm with an S1 and S2, and no  significant murmur, rub or gallop is appreciated.  His distal pulses are  2+ in all four extremities, and no femoral bruits are noted.  LUNGS:  Essentially clear to auscultation bilaterally.  SKIN:  No rashes or lesions are noted.  ABDOMEN:  Soft and nontender with active bowel sounds.  EXTREMITIES:  There is no cyanosis, clubbing or edema noted.  MUSCULOSKELETAL:  There is no joint deformity or effusions and no spine  or CVA tenderness.  NEUROLOGIC:  He is alert and oriented.  Cranial nerves II-XII grossly  intact.   DIAGNOSTIC DATA:  Chest x-ray:  He has an elevated right hemidiaphragm  and a minimal left effusion but no acute disease.   EKG is sinus rhythm, rate 64, with no acute ischemic changes, but his T  waves in lead III and aVF were inverted in an ECG dated April of 2006  and are now upright.   LABORATORY VALUES:  INR is 1.0, PTT 32.  Hemoglobin 12.2, hematocrit  35.9, WBC 6.5, platelets 200.  Sodium 139, potassium 3.7, chloride 110,  BUN 13, creatinine 0.9, glucose 91.  CK-MB 129/2.5 with a troponin I of  0.01.   IMPRESSION:  Chest pain.  Symptoms are concerning for unstable anginal  pain.  He will be admitted to the hospital.  We will cycle cardiac enzymes.  He will  be continued on  aspirin with the addition of heparin  and nitroglycerin to be used if he has recurrent symptoms.  Cardiac  catheterization will be performed.  Further evaluation and treatment  will depend on the results of the catheterization.  He will be continued  on twice a day Nexium for his reflux symptoms and stools will be  guaiac'd.  He will also be continued on Advair but will increase it to  twice a day for better control of his symptoms.      Theodore Demark, PA-C      Salvadore Farber, MD  Electronically Signed    RB/MEDQ  D:  04/25/2006  T:  04/25/2006  Job:  [id number]   cc:   [name], M.D.

## 2010-07-16 NOTE — Discharge Summary (Signed)
[place]  Patient:    [name], [name]                      MRN: [id number] Adm. Date:  [id number] Disc. Date: [id number] Attending:  Marlowe Kays Page Dictator:   Ralene Bathe, P.A.                           Discharge Summary  ADMISSION DIAGNOSIS: 1. Painful, fixed plantar deformity, right ankle and foot. 2. Chronic obstructive pulmonary disease. 3. Chronic bronchitis.  DISCHARGE DIAGNOSES: 1. Painful, fixed plantar deformity, right ankle and foot. 2. Chronic obstructive pulmonary disease. 3. Chronic bronchitis. 4. Status post triple arthrodesis, right foot and ankle. 5. Postoperative urinary retention.  OPERATIONS:  Triple arthrodesis, right foot and ankle, with autograft bone graft and allograft.  SURGEON:  Illene Labrador. [place], M.D.  ASSISTANT:  Dorie Rank, P.A.-C.  ANESTHESIA:  Spinal.  DRAINS:  Two drains placed intraoperatively.  CONSULTS:  None.  BRIEF HISTORY:  Mr. Tangeman is a 77 year old male who has had painful deformity to the right foot and ankle.  It is significantly affecting his activities of daily living and having significant pain.  At this time, it is felt that ankle/foot triple arthrodesis fusion was indicated.  Risks and benefits were discussed with the patient and he was in agreement to proceed.  HOSPITAL COURSE:  The patient was admitted and underwent the above-named procedure and tolerated this well.  All appropriate IV antibiotics and analgesics were provided.  A PCA was utilized postoperatively and he was able to be weaned to p.o. by postoperative day #2.  He did have some urinary retention requiring Foley placement.  He had a trial of Urecholine and by postoperative day #3, was able to void on his own.  Drains that were placed intraoperatively, were removed on postoperative day #2, Jun 30, 2000.  I changed his splint this time, myself and Orthotec, Penrose drains were removed, pins and wound looked  good at that time.  On Jul 01, 2000, the patient was doing extremely well.  He was voiding without difficulty.  The pain was controlled on p.o. analgesics and he was afebrile and his splint was dry, and he was neurovascularly intact.  At this time, he was stable for discharge to home.  LABORATORY DATA:  Shows hemoglobin 11.9 preoperatively.  Routine chemistries preoperatively were within normal limits.  Urinalysis normal.  X-rays show postoperative ankle fusion in good alignment.  Chest x-ray and EKG was not found that the time of dictation.  CONDITION ON DISCHARGE:  Stable, improved.  DISCHARGE MEDICATIONS/PLANS:  The patient is being discharged to home, nonweightbearing to the operative extremity.  Follow-up in our office two weeks postoperatively.  Call for a time.  Keep ankle elevated, splint clean and dry.  Prescriptions are given for Mepergan Fortis 1-2 q.4-6h. p.r.n. for severe pain, or Darvocet-N 100 one q.4-6h. for mild to moderate pain.  Call for any significant problems.  Resume home medications, home diet. DD: 08/22/00 TD:  08/22/00 Job: [id number]

## 2010-07-16 NOTE — Op Note (Signed)
[facility]  Patient:    [name], [name]                      MRN: [id number] Proc. Date: 06/28/00 Adm. Date:  [id number] Attending:  Marlowe Kays Page                           Operative Report  PREOPERATIVE DIAGNOSIS:  Painful planovalgus deformity, right foot and ankle.  POSTOPERATIVE DIAGNOSIS:  Painful planovalgus deformity, right foot and ankle.  OPERATION PERFORMED:  Triple arthrodesis, right foot and ankle.  SURGEON:  Dr. Simonne Come.  SURGEON:  Dorie Rank, P.A.-C.  ANESTHESIA:  General.  PATHOLOGY AND JUSTIFICATION FOR PROCEDURE:  He had a painful valgus foot with weightbearing. Standing x-ray demonstrated basically no opening up of the tibiotalar joint. He had significant arthritic changes at the talonavicular joint and it was felt that triple arthrodesis was indicated to solve his problem.  DESCRIPTION OF PROCEDURE:  Prophylactic antibiotics, satisfactory general anesthesia, pneumatic tourniquet, right lower leg from mid calf to toes to prepped with Duraprep and draped in a sterile field. I started out with a sinus tarsi incision and released the origin of the extensor brevis muscles and reflected them distally. The sinus tarsi was cleared of soft tissue. There was a good bit of fibrotic material present in the subtalar joint and the calcaneal cuboid joint. The calcaneal cuboid joint was identified with the spinal needle and the C-arm confirming what appeared to be the anatomic situation at surgery. I then removed the prominent vertical portion of the os calcis with an osteotome. All bone was saved for use later. I then went to the calcaneal cuboid joint where the cartilaginous endpoints were removed with osteotome, curette and rongeur. I then went down to the subtalar joint and removed all articular cartilage. There was very little motion in the subtalar joint. He had a significant deformity of the talonavicular joint with  the navicula curved around the head of the talus with a good bit of bony overgrowth and this necessitated my making a second incision medially and once again in utilized the C-arm to tentatively identify the talonavicular joint. Anatomically, I felt that we confirmed the location and after opening the joint with a 15 knife blade and removing fibrous tissue with a small rongeur, I used the osteotome and curettes once again to remove the hooked portion of the navicula around the head of the talus and the distal portion of the head of the talus. This gave me a view of the extremely concave articular surface of the navicula which I then removed in a squared off fashion with the same instruments until I could get a good apposition against the squared off portion of the talus. I then returned to the subtalar joint and removed all the remaining articular cartilage. We then moved the foot in what I felt was the most desirable corrected position and placed two Steinmann pins under direct visualization medially and laterally into the talus. We used C-arm to monitor pin placement. This could leave a moderate gap at the talonavicular joint which I then tacked meticulously with local autogenous bone that we had saved during the case. I also impacted a little of the same at the calcaneocuboid joint. The subtalar joint, I packed slightly with the remaining autogenous bone and the rest with bank ______ cancellous bone which I tightly impacted with the impactor. I then closed the medial wound  over a 1/4 Penrose drain with interrupted 3-0 Vicryl and subcutaneous tissue and interrupted 4-0 nylon on the skin. Laterally the same closure. At this point, I released the tourniquet with 2 hours and 28 minutes having elapsed which gave me time to get into the bone graft laterally with optimal visualization. A 3-0 Vicryl sutures were then placed on the tips of the Penrose drains for easier retrieval later. The pins  were bent and cut. Betadine Adaptic and a voluminous dry sterile dressing and short leg splint cast was applied. He was given 30 mg of Toradol IV. The patient tolerated the procedure well and was taken to the recovery room in satisfactory condition with no known complications. DD:  06/28/00 TD:  06/29/00 Job: [id number]

## 2010-07-16 NOTE — Cardiovascular Report (Signed)
[name], [name]               ACCOUNT NO.:  [id number]   MEDICAL RECORD NO.:  [id number]          PATIENT TYPE:  INP   LOCATION:  2807                         FACILITY:  [facility]   PHYSICIAN:  Bruce R. Juanda Chance, MD, FACCDATE OF BIRTH:  Feb 09, 1933   DATE OF PROCEDURE:  04/25/2006  DATE OF DISCHARGE:                            CARDIAC CATHETERIZATION   HISTORY:  [name] is a 77 year old and has no prior history of  known heart disease.  He was evaluated for chest pain with a stress test  in January which showed no ischemia.  He was admitted early this morning  with chest pain consistent with unstable angina and scheduled for  evaluation angiography.   PROCEDURE:  The procedure was performed by femoral artery with arterial  sheath and 6-French preformed coronary catheters.  A front wall arterial  puncture was performed and Omnipaque contrast was used.  At completion  of diagnostic study, we made decision proceed with intervention on the  lesion in the proximal LAD.   The patient was given Angiomax bolus and infusion.  He had previously  been given aspirin.  He was given 600 mg of Plavix and 20 mg of Pepcid.  He was enrolled in the Perseus Trial which randomizes him to the Taxus  Express stent or the next generation Taxus stent.   We used a 6-French Q-4 guiding catheter with side holes and a Prowater  wire.  We crossed the lesion with the wire without difficulty.  We  predilated the lesion with a 2.5 x 20-mm Maverick performing one  inflation up to 8 atmospheres for 30 seconds.  We then deployed a 3.0 x  20 mm TAXUS Perseus stent deploying this with one inflation of 14  atmospheres for 30 seconds.  We then post dilated the stent with a 3.25  x 15 mm Quantum Maverick performing two inflations up to 16 atmospheres  for 30 seconds.  We then dilated the very proximal aspect of the stent  with a 3.5 x 8-mm Quantum Maverick performing one inflation up to 14  atmospheres for 30  seconds.  There was slight slow flow down the vessel,  and we gave 600 mg of intracoronary verapamil and nitroglycerin and then  performed final diagnostic studies through the guiding catheter.   At the end of the procedure, the patient developed shivering but had no  rash and no fever.  This resolved shortly after we took him off the  table.   RESULTS:  Left main coronary artery was free of significant disease.   Left anterior descending artery gave rise to a moderately large and two  small diagonal branches and two septal perforators.  After the second  diagonal branch, there was a 95% stenosis which was focal but there was  segmental disease over about 18 mm.  The distal vessel was free of major  obstruction.   The circumflex artery gave rise to a ramus branch, a small marginal  branch, a larger marginal branch and posterolateral branch.  These  vessels were free of significant disease.   The right coronary artery was a  small vessel that gave rise to a small  posterior branch and small posterolateral branch.  There was 70-80%  narrowing in the very proximal portion of the posterior descending  branch.  This with a small caliber vessel that appeared to be only about  1.5 mm.   The left ventriculogram performed the RAO projection showed good wall  motion with no areas of hypokinesis.  The estimated ejection fraction of  60%.   Following stenting of the lesion in the proximal LAD stenosis improved  from 95% to 0%.   The aortic pressure was 133/69 with mean of 95 and left ventricle  pressure 133/7.   CONCLUSION:  1. Coronary artery disease with 95% stenosis in the proximal LAD, no      significant obstruction of circumflex artery, 70-80% stenosis in a      small posterior descending branch of the right coronary artery and      normal left ventricular function with an estimated fraction of 60%.  2. Successful PCI of the lesion in the proximal LAD using a TAXUS      Perseus  stent with improvement in narrowing from 95% to 0%.   DISPOSITION:  The patient to return __________  for further observation.  As part of Perseus Trial, will plan follow-up angiography at 9-12  months.  If the patient remains stable, he may be a candidate for  discharge in the morning.      Bruce Elvera Lennox Juanda Chance, MD, [facility]  Electronically Signed     BRB/MEDQ  D:  04/25/2006  T:  04/26/2006  Job:  [id number]   cc:   [name], M.D.  Rollene Rotunda, MD, [facility] [place] Rotan, Fayrene Fearing  Cardiopulmonary Laboratory

## 2010-07-16 NOTE — Cardiovascular Report (Signed)
NAMEVINCENT, STREATER               ACCOUNT NO.:  [id number]   MEDICAL RECORD NO.:  [id number]          PATIENT TYPE:  INP   LOCATION:  2021                         FACILITY:  [facility]   PHYSICIAN:  Veverly Fells. Excell Seltzer, MD  DATE OF BIRTH:  [birth year]   DATE OF PROCEDURE:  05/08/2006  DATE OF DISCHARGE:                            CARDIAC CATHETERIZATION   PROCEDURE:  Selective coronary angiography.   INDICATIONS:  [name] is a very nice 77 year old gentleman who  underwent recent percutaneous coronary intervention with a drug-eluting  stent in the proximal LAD on April 25, 2006.  At that point, he  presented with unstable angina and was found to have very high-grade LAD  disease.  He was treated successfully with a drug-eluting stent in the  Perseus trial.  He had an uneventful post procedure course, until the  day of admission when he developed substernal chest pain that he was  unable to distinguish from his anginal equivalent.  He was referred for  repeat cardiac catheterization in the setting of his concerning  symptoms.   PROCEDURAL DETAILS:  The risks and indications of the procedure were  explained to the patient.  Informed consent was obtained.  The right  groin was prepped, draped and anesthetized with 1% lidocaine.  Using  modified Seldinger technique, a 6-French sheath was placed in the right  femoral artery without difficulty.  Selective coronary angiography was  performed using standard 6-French preformed Judkins' catheters.  All  catheter exchanges were performed over a guidewire.  At the conclusion  of the case, the patient was transferred to the holding area in stable  condition.   FINDINGS:  The left main coronary artery is free of significant  angiographic disease.  There is minor nonobstructive plaque present.  The left main trifurcates into the LAD, a small intermediate branch in  the left circumflex.   The LAD is a large-caliber vessel that courses down  to the left  ventricular apex.  There are two septal perforator branches and two  medium-caliber diagonal branches in the proximal portion that vessel.  There is a widely patent stent present in the proximal LAD, without any  evidence of restenosis, and there is no significant disease on either  edge of the stent.  The second diagonal branch has minor ostial disease,  as it arises from the p.o. mid-portion of the stent.  There is TIMI III  flow in that vessel, and the degree stenosis is clearly less than 50%.   The ramus intermedius is a small-caliber vessel that has no significant  angiographic disease.   The left circumflex is a large caliber in its proximal portion.  It  gives off a first OM branch and a left posterolateral branch.  There is  no significant disease in the left circumflex system.   The right coronary artery is small.  It gives off a small posterior  descending branch.  The distal right coronary artery has a 70% lesion,  and it is a small vessel through that region.  There is no other  significant disease in the right coronary artery.  CONCLUSIONS:  A two-vessel coronary artery disease with a widely patent  stent in the LAD and stable disease in a small right coronary artery.   RECOMMENDATIONS:  I suspect Mr. Delrossi's pain was a non-cardiac  related.  He should continue on medical therapy.  He appears to have had  a very good result from his stenting procedure of the LAD, with no  problems seen on this study.  We will plan on same-day discharge after  he recovers.      Veverly Fells. Excell Seltzer, MD  Electronically Signed     MDC/MEDQ  D:  05/08/2006  T:  05/08/2006  Job:  [id number]   cc:   Rollene Rotunda, MD, [facility]  [name], M.D.

## 2010-07-16 NOTE — Discharge Summary (Signed)
[name], [name]               ACCOUNT NO.:  [id number]   MEDICAL RECORD NO.:  [id number]          PATIENT TYPE:  INP   LOCATION:  1312                         FACILITY:  [facility]   PHYSICIAN:  Thornton Park. Daphine Deutscher, MD  DATE OF BIRTH:  1932/08/29   DATE OF ADMISSION:  01/08/2008  DATE OF DISCHARGE:  01/10/2008                               DISCHARGE SUMMARY   ADMITTING DIAGNOSIS:  Retching and vomiting after Nissen fundoplication.   HISTORY:  [name] was discharged home on after recent lap Nissen  done on September 12, 2007.  He bounced back after he took some bromocriptine  and had nausea and retching.  He was seen in the emergency room by Dr.  Donell Beers, who admitted him to be on the safe side.  A Gastrografin swallow  looked satisfactory with no obstruction or leak, or disruption of his  Nissen fundoplication.  He was started on a diet of liquids which he  tolerated.  As soon as he got confidence, he was ready for discharge on  January 10, 2008.  He is doing well.  Follow up in the office in 2 to 3  weeks.      Thornton Park Daphine Deutscher, MD  Electronically Signed     MBM/MEDQ  D:  02/07/2008  T:  02/07/2008  Job:  [id number]

## 2010-07-16 NOTE — Discharge Summary (Signed)
NAMEDOLORES, MCGOVERN               ACCOUNT NO.:  [id number]   MEDICAL RECORD NO.:  [id number]          PATIENT TYPE:  INP   LOCATION:  6526                         FACILITY:  [facility]   PHYSICIAN:  Rollene Rotunda, MD, FACCDATE OF BIRTH:  November 27, 1932   DATE OF ADMISSION:  04/25/2006  DATE OF DISCHARGE:  04/26/2006                         DISCHARGE SUMMARY - REFERRING   DISCHARGE DIAGNOSES:  1. Acute coronary syndrome, false negative Adenosine Myoview.  2. Coronary artery disease status post drug eluting stent to the left      anterior descending.  3. Perseus trial.  4. History as noted below.   PROCEDURES PERFORMED:  Cardiac catheterization with Perseus Taxus  stenting to the proximal left anterior descending by Dr. Juanda Chance on  04/25/06.   SUMMARY OF HISTORY:  This is a 77 year old white male who has had  nocturnal episodes of chest discomfort that resolve in approximately 15  minutes without medical therapy.  A third episode awoke him at 3 o'clock  on the morning of admission.  He was told by Dr. Christell Constant to call 911 for  reoccurring symptoms thus he did.  This episode lasted approximately 45  minutes rather than the usual 15 minutes.  At time EMS arrived his  discomfort had resolved.  He did take some aspirin.  He also reports  exertional atypical chest discomfort that starts about two minutes into  his exercise bike or walking.  He recently had an Adenosine Myoview that  was negative for ischemia, showed ejection fraction of 67%.   PAST MEDICAL HISTORY:  Notable for catheterization in 1989 without  significant disease.  Myoview in 2004 showed an ejection fraction of  61%, possible reversible inferior defect that felt to be low risk.  Asthma, chronic obstructive pulmonary disease, chronic bronchitis,  anemia, diverticular bleed in June of 2007, colon polyps, GERD, benign  prostatic hypertrophy, degenerative joint disease, osteoporosis.   ALLERGIES:  LIPITOR intolerance secondary to  myalgias.  PENICILLIN  allergy.   LABORATORY DATA:  Chest x-ray on the 26th showed elevation along the  medial aspect of the left hemidiaphragm and probable left pleural  effusion.  Admission weight was 167 pounds.  Admission hemoglobin and  hematocrit was 12.2 and 35.9, normal indices, platelets 200, white blood  cells 6.5.  PTT 32, PT 13.2.  CK, MB and troponin I within normal limits  x4.  I-stat showed a sodium of 139, potassium 3.7, BUN 13, creatinine  0.9, glucose 110.  Prior to discharge on the 27th, sodium was 137,  potassium 3.8. BUN 10, creatinine 0.9, normal LFTs.  Fasting lipids on  the 27th showed a total cholesterol 165, triglycerides 56, HDL 43, LDL  111.  TSH was 1.978.  EKG showed sinus bradycardia, normal axis,  nonspecific ST-T wave changes.   HOSPITAL COURSE:  The patient was admitted to [facility] and  continued on his home medications.  Cardiac catheterization was  performed on 04/25/06 by Dr. Juanda Chance to further evaluate his continued  symptoms despite negative stress test.  Catheterization showed a 95%  proximal left anterior descending, a 70 to 80% PLA that was  noted to be  very small, ejection fraction 60%.  The patient was placed in a Perseus  study utilizing a Taxus stent, reduced the LAD lesion from 95% to 0%  without difficulty.  Post catheterization Dr. Juanda Chance noted a shivering  reaction at the end of the procedure however he did not have any fever  or progressive reaction to medications or possible contrast allergy.  By  the 27th he was ambulating without difficulty.  After being evaluated by  Dr. Antoine Poche it was felt that the patient could be discharged home.  He  was placed on Pravastatin for his hyperlipidemia.   DISPOSITION:  Wound care and activities are guided per discharge  supplemental sheet.  He will be called in approximately nine months in  regards to the Perseus study.  He will need to take aspirin 325 mg daily  and Plavix 75 mg daily  for at least 12 months given the Perseus four  course trial.  New prescriptions include nitroglycerin 0.4 as needed,  Pravastatin 40 mg nightly.  He was asked to continue Advair 250/50  daily, Albuterol MDI p.r.n., Singulair p.r.n., Guaifenex 600 mg b.i.d.,  Doxazosin 4 mg daily, atenolol weekly, Nexium 40 mg or Prilosec 20 mg  b.i.d., Citracal plus D daily, fish oil 1000 mg daily, multivitamin with  iron daily.  He will follow-up with Dr. Antoine Poche in [place] on  05/09/06 at 9:15.  There are not any [name] appointments available until  late April or early May.  He is asked to follow-up with Dr. Christell Constant as  needed.  In approximately six to eight weeks he will need blood work for  fasting lipids and LFTs since Pravastatin was started.   Discharge time was 40 minutes.      Joellyn Rued, PA-C      Rollene Rotunda, MD, [facility]  Electronically Signed    EW/MEDQ  D:  04/26/2006  T:  04/26/2006  Job:  [id number]   cc:   [name], M.D.

## 2010-07-16 NOTE — Assessment & Plan Note (Signed)
[facility]                            CARDIOLOGY OFFICE NOTE   KERNEY, HOPFENSPERGER                        MRN:          [id number]  DATE:05/18/2006                            DOB:          1932/05/29    REASON FOR PRESENTATION:  Evaluate patient with chest pain and recent  stenting.   HISTORY OF PRESENT ILLNESS:  The patient was initially admitted in  February with chest pain and unstable angina.  He subsequently underwent  a cardiac catheterization demonstrating that the LAD had 85% mid  stenosis.  The right coronary artery had a 70% to 80% narrowing in the  proximal portion of the PDA, which was a small caliber vessel.  The EF  was 60%.  He had stenting of the LAD with a TAXUS stent.  He did well  with this, but had recurrent chest discomfort on March 8th.  He was  admitted.  He had a cardiac catheterization demonstrating a widely  patent stent, and no new disease.  He was then referred to  Gastroenterology.  He has seen them, and has had an EGD, and apparently  is to have a barium swallow tomorrow.  He has had none of the chest  discomfort that he had prompting his admissions.  He was given a  prescription for Levsin if he does have any recurrent chest discomfort.  He is a little bit leery of taking any nitroglycerin because he had  blood pressure drop with that.  He denies any new shortness of breath,  and has had no PND or orthopnea.  He has had no palpitations,  presyncope, or syncope.   PAST MEDICAL HISTORY:  1. Coronary artery disease as described.  2. Asthma.  3. Chronic obstructive pulmonary disease.  4. Anemia.  5. Diverticular bleed June 2007.  6. Chronic polyps.  7. Gastroesophageal reflux disease.  8. Benign prostatic hypertrophy.  9. Degenerative joint disease.  10.Osteoporosis.  11.Hiatal hernia.   ALLERGIES:  PENICILLIN.   MEDICATIONS:  1. Advair 250/50 b.i.d.  2. Singulair 10 daily.  3. Mucinex 600 mg b.i.d.  4.  Doxazosin.  5. Actonel 35 mg weekly.  6. Nexium.  7. Citracal.  8. Fish oil.  9. Plavix 75 mg daily.  10.Pravastatin 40 mg q.p.m.  11.Aspirin 325 mg daily.   REVIEW OF SYSTEMS:  As stated in the HPI, and otherwise negative for  other systems.   PHYSICAL EXAMINATION:  Patient is in no distress.  Blood pressure 136/76.  Heart rate is 63 and regular.  Weight 173  pounds.  Body mass index 27.  HEENT:  Eyes unremarkable.  Pupils equal, round, and reactive to light.  Fundi not visualized. Oral mucosa unremarkable.  NECK:  No jugular venous distention.  Wave form within normal limits.  Carotid upstroke brisk and symmetric.  No bruits.  No thyromegaly.  LYMPHATICS:  No cervical, axillary, or inguinal adenopathy.  LUNGS:  Clear to auscultation bilaterally.  BACK:  No costovertebral angle tenderness.  CHEST:  Unremarkable.  HEART:  PMI not displaced or sustained.  S1 and S2 within normal limits.  No S3.  No S4.  No clicks.  No rubs.  No murmurs.  ABDOMEN:  Flat.  Positive bowel sounds.  Normal in frequency and pitch.  No bruits.  No rebound.  No guarding.  No midline pulsatile mass.  No  organomegaly.  SKIN:  No rashes.  No nodules.  EXTREMITIES:  Two plus pulses.  No edema.  EKG:  Sinus rhythm.  Rate 63.  Axis within normal limits.  Intervals  within normal limits.  No acute ST wave changes.   1. Coronary artery disease.  Patient is having no symptoms.  He will      continue with secondary risk reduction.  He is going to take the      Levsin if he has any chest discomfort.  He can also take 1/2 of his      sublingual nitroglycerin if the Levsin does not work.  With any      symptoms that do not go away easily, he should call 911.  He will      continue his Plavix, and this should not be discontinued for at      least a year, and not without my review.  2. We will see him back in about 2 months in [place].     Rollene Rotunda, MD, [facility]  Electronically Signed    JH/MedQ  DD:  05/18/2006  DT: 05/18/2006  Job #: [id number]   cc:   [name], M.D.

## 2010-07-16 NOTE — H&P (Signed)
[facility]  Patient:    [name], [name]                     MRN: [id number] Adm. Date:  06/28/00 Attending:  Fayrene Fearing P. Aplington, M.D. Dictator:   Druscilla Brownie. Shela Nevin, P.A. CC:         Monica Becton, M.D., [facility], [place], [place] [address]                         History and Physical  DATE OF BIRTH: May 09, 1932  CHIEF COMPLAINT: "Pain and problems in my right foot and ankle."  HISTORY OF PRESENT ILLNESS: This patient is a 77 year old white male who has been seen by [place] for continuing problems concerning his right foot.  The main problem is with his right foot when he moves or tries to turn.  He has difficulty now weightbearing on this foot, specifically when climbing stairs or stepping out of the shower.  His overall activity is markedly diminished due to the pain in his right foot and ankle.  He has had injections in this foot with cortisone as well as trying corrective shoes.  Unfortunately, this has not given him any lasting relief in his pain and discomfort.  It was decided the patient needed surgical intervention at this time and he is being admitted for triple arthrodesis of the right ankle and foot and possible right iliac crest bone graft.  The patient has had a fusion of his left foot in 1994 by Dr. Sherry Ruffing, a podiatrist in [place], [place].  He had some relief with that but his main problem is his right foot.  PAST MEDICAL HISTORY:  1. Chronic bronchitis and COPD.  2. Some recent anemia.  CURRENT MEDICATIONS:  1. Azmacort inhaler and other inhalers.  2. Lantex.  3. Foradil.  PAST SURGICAL HISTORY: Left foot reconstruction in 1994.  ALLERGIES: No known drug allergies.  SOCIAL HISTORY: The patient neither smokes nor drinks.  He is married.  FAMILY HISTORY: Noncontributory.  REVIEW OF SYSTEMS: CNS: No seizure disorder, paralysis, or double vision. RESPIRATORY: The patient has  shortness of breath, most specifically when he is leaning over or if he rests his chest against anything to work at arms length. No hemoptysis.  CARDIOVASCULAR: No chest pain, no angina, no orthopnea. GASTROINTESTINAL: No nausea, vomiting, melena, or bloody stools. GENITOURINARY: No discharge, dysuria, or hematuria.  MUSCULOSKELETAL: Primarily as in present illness.  PHYSICAL EXAMINATION:  GENERAL: Alert, cooperative, and friendly 77 year old white male, who is accompanied by his wife.  VITAL SIGNS: Blood pressure 120/78, pulse 70, respirations 12.  HEENT: Normoactive.  PERRLA.  Oropharynx clear.  CHEST: Clear to auscultation.  No rhonchi, no rales, no wheezes.  HEART: Regular rate and rhythm.  No murmurs heard.  ABDOMEN: Soft, nontender.  Liver and spleen not felt.  GU/RECTAL: Examinations not done, not pertinent to present illness.  EXTREMITIES: The patient has a fixed valgus deformity of the left ankle and foot.  He is unable to move the ankle and foot.  He has pain with range of motion with the ankle, but pain is primarily in the forefoot area.  ADMISSION DIAGNOSES:  1. Painful fixed plano deformity, right ankle and foot.  2. Chronic obstructive pulmonary disease.  3. Chronic bronchitis.  4. Recent anemia.  PLAN: The patient will undergo triple arthrodesis of the right foot and ankle with possible right iliac crest bone  graft.  The patient has been seen preoperatively by Dr. [name] of [facility] and has been cleared for the surgery.  He has had pulmonary work-up. DD:  06/21/00 TD:  06/22/00 Job: [id number]

## 2010-07-16 NOTE — H&P (Signed)
NAMEBECKAM, [name]               ACCOUNT NO.:  [id number]   MEDICAL RECORD NO.:  [id number]          PATIENT TYPE:  INP   LOCATION:  2021                         FACILITY:  [facility]   PHYSICIAN:  Reginia Forts, MD     DATE OF BIRTH:  [birth year]   DATE OF ADMISSION:  05/06/2006  DATE OF DISCHARGE:                              HISTORY & PHYSICAL   CHIEF COMPLAINT:  Chest pain.   HISTORY OF PRESENT ILLNESS:  [name] is a 77 year old Caucasian  male with a history of coronary artery disease status post PCTI to the  LAD on April 25, 2006.  He presents with substernal chest pain and  upper epigastric pain.  The patient was eating supper and developed  chest pressure with no associated shortness of breath or diaphoresis.  Is unable to discern a difference in the chest pain quality tonight  versus his chest pain one week ago.  He states that the chest pain was  much more tense this time, and subsequently he took one nitroglycerin  which dropped his blood pressure.  EMS was called.  Upon arrival, they  noted bulging in the epigastric region which upon palpation was quite  tender and exacerbated his pain.  The patient was taken to the emergency  room where the chest pain was relieved with 1 mg of morphine IV.  The  patient is currently chest pain free.  He denies any orthopnea or  paroxysmal nocturnal dyspnea.   PAST MEDICAL HISTORY:  1. Coronary artery disease.  Cardiac catheterization in 1999      demonstrated no significant disease.      a.     Myoview in 2004 showed an EF of 61% with possible reversible       defect inferiorly and felt to be low risk.      b.     Nuclear stress test in January 2008 was negative for       reducible ischemia.      c.     Cardiac catheterization April 25, 2006 demonstrated 95%       proximal LAD lesion.  The patient was enrolled in the Perseus       trial study and received a Taxus stent.  A small PLA with 70 to       80% lesion was noted.   Ejection fraction was 60%.  2. Asthma.  3. COPD.  4. Chronic bronchitis.  5. Anemia.  6. Diverticular bleed in June 2007.  7. Colonic polyps.  8. GERD.  9. Benign prostatic hypertrophy.  10.Degenerative joint disease.  11.Osteoporosis.  12.Hiatal hernia.   PAST SURGICAL HISTORY:  1. Right inguinal hernia repair.  2. Left foot surgery.  3. Right foot surgery x2.   ALLERGIES:  1. PENICILLIN.  2. POSSIBLE CONTRAST ALLERGY.  3. LIPITOR INTOLERANCE DUE TO MYALGIAS.   MEDICATIONS:  1. Aspirin 325 mg once a day.  2. Plavix 75 mg once a day.  3. Nitroglycerin sublingual as needed.  4. Pravastatin 40 mg at night.  5. Advair 250/50 mg 1 puff a day.  6. Albuterol MDI  p.r.n.  7. Singulair p.r.n.  8. Guaifenex 600 mg twice a day.  9. Doxazosin 4 mg once a day.  10.Atenolol weekly.  11.Nexium 40 mg twice a day.  12.Citracal plus vitamin D.  13.Fish oil 1000 mg daily.  14.Multivitamins plus iron once a day.   SOCIAL HISTORY:  The patient lives in [place] with his wife.  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  Denies any alcohol or drug use.   Allen HISTORY:  Notable for a mother who passed away at age 18  secondary to complications of childbirth when the patient was 34  old.  Father expired at age 58 from CHF and myocardial infarction.  One  brother had coronary disease in his 59s, one brother had a sudden death  while horseback riding.  Two sisters expired in their 90s secondary to  congestive heart failure.   REVIEW OF SYSTEMS:  Notable for chronic dyspnea on exertion and chronic  foot pain.  The rest of the full review of systems was reviewed and  negative.   PHYSICAL EXAMINATION:  Temperature 97.2, pulse 112/72, respiratory rate  62, blood pressure 18.  In general, the patient is awake, alert, and oriented x3, in no acute  distress.  HEENT:  Normocephalic and atraumatic.  Pupils equal and  round and  reactive to light.  Extraocular movements are intact.  NECK:  No JVD, no carotid bruits.  CARDIOVASCULAR:  Regular rhythm, normal rate, no murmurs, rubs, or  gallops.  LUNGS:  Clear to auscultation bilaterally with some upper airway noises,  no wheezing.  ABDOMEN:  Positive bowel sounds, soft, nontender, nondistended.  EXTREMITIES:  No cyanosis, clubbing, or edema, no bruits.  2+ distal  pulses and 2+ femoral pulses.  MUSCULOSKELETAL:  No joint deformity, no effusions.  NEUROLOGIC:  Cranial Nerves II-XII grossly intact, no focal  musculoskeletal or sensory deficits.   Chest x-ray demonstrates bibasilar scarring, no atelectasis, small left  effusion.   EKG demonstrates normal sinus rhythm with a heart rate of 62, normal  axis, and no ST, T wave changes.   Potassium 3.9, BUN 18, creatinine is 1.1, troponin is less than 0.05, CK  is 181, MB of less than 1.  Hemoglobin is 12.9.   ASSESSMENT AND PLAN:  This is a 77 year old Caucasian male with recent  PTCI to the LAD one week ago now with recurrent chest pain.  1. Chest pain. We will rule the patient out for myocardial infarction      and heparinized.  The symptoms are more suggestive of a hiatal      hernia.  We will continue the patient's home medications.  2. Hypertension.  The patient has gotten stable on his home      medications.   DISPOSITION:  We will rule the patient out and determine if any further  testing is required.  The patient advised to continue his Nexium b.i.d.      Reginia Forts, MD  Electronically Signed     RA/MEDQ  D:  05/07/2006  T:  05/07/2006  Job:  [id number]

## 2010-07-16 NOTE — Op Note (Signed)
[facility]  Patient:    [name], REASONS                      MRN: [id number] Proc. Date: 08/09/00 Adm. Date:  [id number] Attending:  Marlowe Kays Page                           Operative Report  PREOPERATIVE DIAGNOSIS:  Status post triple arthrodesis right foot and ankle with two retained Steinmann pins.  POSTOPERATIVE DIAGNOSIS:  Status post triple arthrodesis right foot and ankle with two retained Steinmann pins.  OPERATION:  Removal of two Steinmann pins right foot and ankle with application of short leg fiberglass cast.  SURGEON:  James P. Aplington, M.D.  ASSISTANT:  Nurse.  ANESTHESIA:  Spinal.  PATHOLOGY AND JUSTIFICATION FOR PROCEDURE:  Original surgery was roughly six weeks ago on Jun 28, 2000.  He has had one postop follow-up visit at which time I placed him in his first short leg fiberglass cast, and x-rays looked good. He is here at this time for the above-mentioned surgery and beginning weightbearing.  DESCRIPTION OF PROCEDURE:  Satisfactory spinal anesthesia.  The cast was then bivalved and removed.  The two pins demonstrated no evidence for infection. They were removed with pliers and Bacitracin ointment was placed over the pin tract sites followed by sterile 4 x 4s, then placed him in a well-molded short leg fiberglass cast.  He tolerated the procedure well and was taken to the recovery room in satisfactory condition with no known complications. DD:  08/09/00 TD:  08/09/00 Job: [id number]

## 2010-07-16 NOTE — Discharge Summary (Signed)
NAMEVASHON, [name]               ACCOUNT NO.:  [id number]   MEDICAL RECORD NO.:  [id number]          PATIENT TYPE:  INP   LOCATION:  2852                         FACILITY:  [facility]   PHYSICIAN:  Veverly Fells. Excell Seltzer, MD  DATE OF BIRTH:  30-Sep-1932   DATE OF ADMISSION:  05/06/2006  DATE OF DISCHARGE:  05/08/2006                               DISCHARGE SUMMARY   PRIMARY CARDIOLOGIST:  Dr. Rollene Rotunda.   PRIMARY CARE PHYSICIAN:  [name], M.D.   REASON FOR ADMISSION:  Chest pain.   DISCHARGE DIAGNOSES:  1. Noncardiac chest pain.      a.     Cardiac catheterization this admission with stable anatomy -       please see below.  2. Coronary artery disease.      a.     Status post Taxus drug-eluting stent (Perseus trial) to the       proximal left anterior descending April 25, 2006.  3. Cardiac catheterization this admission revealing patent stent in      the left anterior descending and a 70% distal right coronary artery      stenosis.  4. History of nonischemic nuclear study January 2008.  5. Good left ventricular function.  6. Asthma.  7. Chronic obstructive pulmonary disease.  8. History of anemia.  9. History of diverticular bleed June 2007.  10.History of colon polyps.  11.Gastroesophageal reflux disease.  12.Benign prostatic hypertrophy.  13.Degenerative joint disease.  14.Osteoporosis.  15.Hiatal hernia.   PROCEDURES PERFORMED THIS ADMISSION:  1. Cardiac catheterization by Dr. Tonny Bollman May 08, 2006.      Please see this dictated note for complete details.  Briefly, the      proximal LAD was widely patent.  A second diagonal branch with      minor ostial disease clearly less than 50%.  Ramus intermedius free      of angiographic disease.  The circumflex with no significant      disease.  RCA with a 70% lesion in the distal vessel.   HISTORY:  [name] is a 77 year old male patient with a history of  coronary disease status post Taxus drug-eluting  stent April 25, 2006  who presented to the emergency room with substernal chest pain and upper  epigastric pain.  This developed after eating supper without associated  shortness of breath or diaphoresis.  He was unable to discern the  difference in the chest pain quality between his current symptoms and  the chest pain he had prior to his intervention.  He was admitted for  further evaluation and treatment.  He was maintained on aspirin, Plavix,  and heparin therapy.  He ruled out for myocardial infarction by enzymes.  Dr. Daleen Squibb saw the patient in followup and had a long discussion with him  and his wife.  They eventually agreed to proceed with re-look cardiac  catheterization.  The patient was set up for cardiac catheterization the  morning of May 08, 2006.  This was done by Dr. Excell Seltzer.  The patient  tolerated the procedure well without any complications.  Given  the  findings on cardiac catheterization with stable anatomy in the RCA and a  widely patent stent in the LAD, Dr. Excell Seltzer felt that medical therapy  could be continued.  He felt that the patient was stable enough for  discharge to home as long as his groin remained stable post  catheterization.   LABORATORY AND ANCILLARY DATA:  White count 6100, hemoglobin 11.6,  hematocrit 34, platelet count 216,000.  His hemoglobin is stable with  that obtained on February 27 at 11.5.  This admission, his INR was 1.1,  sodium 137, potassium 3.8, glucose 117, BUN 14, creatinine 1.01, calcium  8.5, CK-MB negative x2, troponin-I negative x2.   Chest x-ray:  Bibasilar scarring, atelectasis, possible small left  effusion.   DISCHARGE MEDICATIONS:  1. Aspirin 325 mg daily.  2. Plavix 75 mg daily.  3. Advair 250/50 daily.  4. Albuterol as needed.  5. Singulair 10 mg daily as needed.  6. Pravastatin 40 mg q.h.s.  7. Doxazosin 4 mg daily.  8. Nexium 40 mg daily.  9. Guaifenex 60 mg b.i.d.  10.Actonel weekly.  11.Citracal Plus B as  directed.  12.Fish oil 1000 mg daily.  13.Multivitamin as taken previously.  14.Nitroglycerin p.r.n. chest pain.   He may return to work on or after May 15, 2006.   ACTIVITY:  He is to increase his activity slowly.  No lifting, driving,  or sexual activity for the next three days.   DIET:  Low-fat, low-sodium diet.   WOUND CARE:  He should call office in [facility] for any groin swelling,  bleeding, or bruising , or fever.   FOLLOWUP:  1. He had been arranged to see Dr. Antoine Poche tomorrow, May 09, 2006.      Since he is being discharged from the hospital today, we have      changed that to May 18, 2006 at 9: 15 a.m. in our [place]      office.  Again, there are no appointments available until April or      May in the [place] office.  Mr. Broyles will be followed up in      the [place] office in the future.  2. The patient has been advised to see Dr. Christell Constant in the next one to      two weeks.  He has been advised to call for an appointment.  He      should follow up regarding his noncardiac chest pain to rule out      other causes.   TOTAL PHYSICIAN CARE TIME:  Greater than 30 minutes on this discharge.      Tereso Newcomer, PA-C      Veverly Fells. Excell Seltzer, MD  Electronically Signed    SW/MEDQ  D:  05/08/2006  T:  05/08/2006  Job:  [id number]

## 2010-07-16 NOTE — Assessment & Plan Note (Signed)
[facility]                            CARDIOLOGY OFFICE NOTE   PACO, CISLO                        MRN:          [id number]  DATE:03/08/2006                            DOB:          1932-05-14    PRIMARY CARE PHYSICIAN:  [name], M.D.   REASON FOR PRESENTATION:  Evaluate patient with chest pain.   HISTORY OF PRESENT ILLNESS:  The patient presents for followup.  He has  had chest discomfort that is different from his previous GI pain.  He  says it is slightly more intense.  It is in a different position but is  still substernal.  He feels like somebody is standing on him. This has  happened at rest.  He has had some of this while unloading the  dishwasher recently.  It lasted for several minutes.  There was no  radiation to his jaw or to his arms.  There was no associated nausea,  vomiting, or diaphoresis.  He has not had any of the palpitations that  bothered him previously, and he has had no PND or orthopnea.  He did get  short of breath recently while using a chain saw, but has not noticed  any resting shortness of breath, no PND or orthopnea.  He was, however,  concerned with these episodes because they are different from previous  and again different from his GI complaints.   PAST MEDICAL HISTORY:  1. Chronic obstructive pulmonary disease.  2. Nonobstructive coronary artery disease.  3. Benign prostatic hypertrophy.  4. Asthma.  5. Arthritis.  6. Osteoporosis.  7. Left and right foot surgeries.   ALLERGIES:  GENERAL ANESTHESIA  and PENICILLIN.   MEDICATIONS:  Advair, Singulair, Mucinex, doxazosin, Actonel, Nexium,  Citracal, fish oil, aspirin, multivitamins, iron.   REVIEW OF SYSTEMS:  As stated in the HPI, otherwise negative for other  systems.   PHYSICAL EXAMINATION:  VITAL SIGNS: Blood pressure 112/66, heart rate 67  and regular. Weight 177 pounds, body mass index 27.  HEENT:  Eyelids unremarkable.  Pupils equal,  round, and reactive to  light.  Fundi not visualized.  Oral mucosa unremarkable.  NECK: No jugular venous distention.  Waveform within normal limits.  Carotid upstrokes brisk and symmetric.  No bruits, thyromegaly.  LYMPHATICS: No cervical, axillary, or inguinal adenopathy.  LUNGS: Clear to auscultation bilaterally.  BACK: No costovertebral angle tenderness.  CHEST: Unremarkable.  HEART: PMI not displaced or sustained.  S1 and S2 within normal limits.  No S3, no S4, no murmurs.  ABDOMEN: Soft, positive bowel sounds normal in frequency and pitch. No  bruits, rebound, guarding, midline pulsatile mass, hepatomegaly, or  splenomegaly.  SKIN: No rashes, no nodules.  EXTREMITIES: 2+ pulses throughout.  No edema, clubbing, or cyanosis.  NEUROLOGIC: Oriented to person, place, and time.  Cranial nerves II-XII  grossly intact.  Motor grossly intact.   EKG: Sinus rhythm, rate 67, axis within normal limits, intervals within  normal limits, no acute ST-T wave changes.   ASSESSMENT AND PLAN:  1. Chest discomfort: The patient's chest discomfort is different from  previous.  There are some symptoms consistent with unstable angina.      Given this the pretest probability of obstructive coronary disease      is somewhat moderate.  He needs stress perfusion study.  He would      not be able to walk on a treadmill because of foot problems.      Therefore, he will have an adenosine Cardiolite.  Despite his      asthma, he has tolerated these in the past without problems.  2. Risk reduction per Dr. Christell Constant.  3. Followup:  I will see him back in one year or sooner based on the      results of the stress perfusion study.     Rollene Rotunda, MD, [facility]  Electronically Signed    JH/MedQ  DD: 03/08/2006  DT: 03/08/2006  Job #: [id number]   cc:   [name], M.D.

## 2010-07-16 NOTE — Assessment & Plan Note (Signed)
[name]                         GASTROENTEROLOGY OFFICE NOTE   DESI, ROWE                        MRN:          [id number]  DATE:05/16/2006                            DOB:          1932/05/29    [name] is an 77 year old male that has known coronary artery  disease, who had recent stent placement by Dr. Juanda Chance.  Postoperatively  he had a recurrence of his pain and was recently recatheterized and was  found that his stents were functioning normally.  This was all done on  April 25, 2006.  The patient describes the recent episode as rather  severe, sharp, subxyphoid pain with mild nausea, but no emesis or  specific hepatobiliary complaints.  He was on omeprazole 20 mg a day, is  now taking Nexium 40 mg in the morning and omeprazole 20 mg at bedtime.  Had resolution of his symptoms.  Denies dysphagia or odynophagia, but  does have a history of peptic strictures of his esophagus with grade C  erosive esophagitis in April 2007 at the time of endoscopy.  I can not  see where he has had previous ultrasound exam.   He currently is doing well and denies any cardiovascular or pulmonary  complaints.  Appetite is good and his weight is stable.   He is on a variety of different medications listed and reviewed in his  chart, they include:  1. Plavix 75 mg a day.  2. Aspirin 325 mg a day.   HISTORY OF MEDICAL PROBLEMS:  Include COPD, BPH, asthma, degenerative  arthritis, and osteoporosis.  He denies use or abuse of NSAIDs or  Celebrex at this time.  IN THE PAST, HAS HAD REACTION TO PENICILLIN.   EXAMINATION:  Shows him to be awake and alert, in no acute distress.  Can not appreciate stigmata of chronic liver disease.  His chest is  entirely clear to percussion and auscultation.  He appears to be in a  regular rhythm without murmurs, gallops, or rubs at this time.  I can  not appreciate hepatosplenomegaly, abdominal masses, or tenderness.  Bowel sounds were normal.   ASSESSMENT:  I am concerned that Mr. Hence may have an element of a  paraesophageal hernia per the rather acute and severe nature of his  pain.  Other consideration would be an atypical presentation of  cholelithiasis.  As mentioned previously, he does have a large hiatal  hernia with known erosive esophagitis.   RECOMMENDATIONS:  1. Continue Nexium 40 mg twice a day 30 minutes before meals.  2. Outpatient ultrasound and endoscopic exam.  3. Continue other multiple medications as listed in his chart.     Vania Rea. Jarold Motto, MD, [facility], FAGA  Electronically Signed    DRP/MedQ  DD: 05/16/2006  DT: 05/16/2006  Job #: [id number]   cc:   Everardo Beals. Juanda Chance, MD, [facility]  [name], M.D.  Dr. ___

## 2010-07-16 NOTE — H&P (Signed)
NAMEANES, RIGEL               ACCOUNT NO.:  [id number]   MEDICAL RECORD NO.:  [id number]          PATIENT TYPE:  INP   LOCATION:  0104                         FACILITY:  [facility]   PHYSICIAN:  Wilhemina Bonito. Marina Goodell, M.D. [facility] OF BIRTH:  1932/11/16   DATE OF ADMISSION:  08/04/2005  DATE OF DISCHARGE:                                HISTORY & PHYSICAL   CHIEF COMPLAINT:  Rectal bleeding.   HISTORY:  [name] is a pleasant 77 year old white male seen in the  emergency room yesterday, August 03, 2005, with rectal bleeding.  He was felt  to be having low-grade diverticular bleeding at that time.  His hemoglobin  was 12.3.  His stool was brown and heme-positive.  He was allowed discharge  to home to rest and instructed to return for recurrent bleeding.  He did  have a bowel movement at about 11 o'clock last night, which was bloody but a  small amount, and then again this morning had another bowel movement which  he describes as bloody and smaller in amount than his initial bleed  yesterday.  He says he feels a little bit lightheaded but otherwise has no  complaints.  He has no abdominal pain or cramping.  No nausea, vomiting,  etc.  He is seen in the emergency room and admitted at this time with a  diverticular bleed for supportive management.   The patient did have colonoscopy June 24, 2005, with left colon tics and  had multiple small polyps.  EGD at that same time showed a 6 cm hiatal  hernia and gastritis.   Laboratory studies were pending at this time.   MEDICATIONS:  1.  Mucinex 600 mg two p.o. b.i.d.  2.  Cardura 4 mg one p.o. daily.  3.  Advair 250/50 mcg twice daily.  4.  Albuterol inhaler p.r.n..  5.  Doxazosin 4 mg daily.  6.  Aspirin 81 mg daily.  7.  Currently holding Caltrate supplement daily and Actonel 35 mg weekly.   ALLERGIES:  PENICILLIN.   PAST HISTORY:  Pertinent for COPD, asthma, coronary artery disease, BPH,  arthritis, osteoporosis and colon polyps and  diverticulosis.   FAMILY HISTORY:  Noncontributory.   SOCIAL HISTORY:  The patient is married, retired, no tobacco, no EtOH.   REVIEW OF SYSTEMS:  CARDIOVASCULAR, PULMONARY:  GENITOURINARY: Negative.  NEUROLOGIC:  Negative.  GASTROINTESTINAL: As above.   PHYSICAL EXAMINATION:  GENERAL:  A well-developed white male in no acute  distress.  VITAL SIGNS:  blood pressure 120/74, pulse is 62, respirations 20,  saturation is 96 on room air, temperature 97.3.  HEENT: Nontraumatic, normocephalic.  EOMI, PERRLA, Sclerae anicteric.  CARDIOVASCULAR:  Regular rate and rhythm with S1 and S2, no murmur, rub or  gallop.  PULMONARY:  Clear to A&P.  ABDOMEN:  Soft and nontender.  There is no mass or hepatosplenomegaly.  Bowel sounds are active.  RECTAL:  Rectal exam is not repeated.  Stool was brown and mucoid and 4+  positive for blood yesterday.  EXTREMITIES:  Without clubbing, cyanosis or edema.   IMPRESSION:  1.  A  77 year old white male with a diverticular bleed, slow, persistent.  2.  History of colon polyps.  3.  Chronic obstructive pulmonary disease and asthma.  4.  Nonobstructive coronary artery disease.  5.  Benign prostatic hypertrophy.  6.  Gastroesophageal reflux disease.  7.  Degenerative joint disease.   PLAN:  The patient is admitted to the service of Dr. Yancey Flemings for  hydration, bowel rest, H&H, transfusions as indicated.  Will hold his  aspirin and hypertensives for the time being and will plan to manage him  conservatively given very recent colonoscopy.      Amy Esterwood, P.A.-C. [facility]    ______________________________  Wilhemina Bonito. Marina Goodell, M.D. [facility]    AE/MEDQ  D:  08/04/2005  T:  08/04/2005  Job:  [id number]

## 2010-07-16 NOTE — Discharge Summary (Signed)
[name], [name]               ACCOUNT NO.:  [id number]   MEDICAL RECORD NO.:  [id number]          PATIENT TYPE:  INP   LOCATION:  1609                         FACILITY:  [facility]   PHYSICIAN:  Wilhemina Bonito. Marina Goodell, M.D. [facility] OF BIRTH:  November 10, 1932   DATE OF ADMISSION:  08/04/2005  DATE OF DISCHARGE:  08/05/2005                                 DISCHARGE SUMMARY   ADMITTING DIAGNOSES:  73.  77 year old white male with a diverticular bleed with hematochezia.  2.  History of colon polyps and diverticulosis.  3.  Chronic obstructive pulmonary disease.  4.  Asthma.  5.  Nonobstructive coronary artery disease.  6.  Benign prostatic hypertrophy.  7.  Gastroesophageal reflux disease.  8.  Degenerative joint disease.   DISCHARGE DIAGNOSES:  1.  Resolved slow stuttering diverticular bleed.  2.  Mild anemia secondary to above.  3.  Other diagnoses as listed above.   CONSULTATIONS:  None.   PROCEDURES:  None.   BRIEF HISTORY:  [name] is a very nice 77 year old white male known to  Dr. Jarold Motto, primary patient of Dr. Vernon Prey, who was seen in the  emergency room yesterday, August 03, 2005, with rectal bleeding.  He was felt  to be having a very low grade diverticular bleed at that time and had a  hemoglobin of 12.3.  Stool was noted to be brownish mucoid in appearance and  not grossly bloody though heme-positive. He was allowed discharge to home to  rest with plans for outpatient follow-up and a follow-up H&H.  He was asked  to return to the emergency room for evidence of recurrent bleeding.  He  apparently had a bowel movement last evening about 11:00 p.m. which was  bloody though a small amount and then this morning had a second bowel  movements, again bloody but small in volume.  He says he feels a little bit  light headed.  He has no complaints of pain, cramping, nausea, vomiting,  etc. He came back to the emergency room, was seen and evaluated, found to be  hemodynamically  stable, and admitted for observation with a slow  diverticular hemorrhage for supportive management.   LABORATORY STUDIES:  On admission, hemoglobin 12.6, hematocrit of 37.2, WBC  of 6.7, platelets of 277.  On June 6, protime was 13, INR of 1.  Electrolytes within normal limits.  Glucose 105.  Liver function studies  normal.  Albumin of 3.2.  Serial H&H were obtained, on June 7, hemoglobin  was 12.3, hematocrit of 36.3, and follow-up on June 8, hemoglobin 11.6,  hematocrit of 34.2.   HOSPITAL COURSE:  The patient was admitted to the service of Dr. Yancey Flemings.  He was placed at bowel rest and on IV fluids and serial H&H.  Fortunately,  he remained quite stable and did not manifest any further bleeding after  admission to the hospital.  The following morning, he was feeling well.  We  advanced his diet and he had one bowel movement which was normal appearing  without any obvious heme.  It was felt that his bleed had resolved  and he  was allowed discharge to home that afternoon with plans to follow up with a  hemoglobin and hematocrit through Dr. Kathi Der office within the next one  week and to follow up with GI on a p.r.n. basis.  He was advised to follow a  full liquid diet for 24 hours and then advance to regular thereafter.  He is  to limit his activities over the next week with no strenuous exercise or  heavy lifting.  He was asked to hold his aspirin for the next two weeks and  the use Tylenol as needed for pain.  Other medications were the same as on  admission including Mucinex 600, 2 p.o. b.i.d., Cardura 4 mg daily, Advair  250/50 b.i.d., albuterol inhalers p.r.n., and Caltrate supplements daily,  Actonel 35 mg weekly.   CONDITION ON DISCHARGE:  Stable and improved.      Amy Esterwood, P.A.-C. [facility]    ______________________________  Wilhemina Bonito. Marina Goodell, M.D. [facility]    AE/MEDQ  D:  08/05/2005  T:  08/06/2005  Job:  [id number]   cc:   [name], M.D.  Fax: [phone]

## 2010-07-22 ENCOUNTER — Emergency Department (HOSPITAL_COMMUNITY): Payer: Medicare Other

## 2010-07-22 ENCOUNTER — Emergency Department (HOSPITAL_COMMUNITY)
Admission: EM | Admit: 2010-07-22 | Discharge: 2010-07-22 | Disposition: A | Discharge status: Home / Self Care | Payer: Medicare Other | Attending: Emergency Medicine | Admitting: Emergency Medicine

## 2010-07-22 DIAGNOSIS — J449 Chronic obstructive pulmonary disease, unspecified: Secondary | ICD-10-CM | POA: Insufficient documentation

## 2010-07-22 DIAGNOSIS — I2510 Atherosclerotic heart disease of native coronary artery without angina pectoris: Secondary | ICD-10-CM | POA: Insufficient documentation

## 2010-07-22 DIAGNOSIS — N289 Disorder of kidney and ureter, unspecified: Secondary | ICD-10-CM | POA: Insufficient documentation

## 2010-07-22 DIAGNOSIS — I209 Angina pectoris, unspecified: Secondary | ICD-10-CM | POA: Insufficient documentation

## 2010-07-22 DIAGNOSIS — Y92009 Unspecified place in unspecified non-institutional (private) residence as the place of occurrence of the external cause: Secondary | ICD-10-CM | POA: Insufficient documentation

## 2010-07-22 DIAGNOSIS — R079 Chest pain, unspecified: Secondary | ICD-10-CM | POA: Insufficient documentation

## 2010-07-22 DIAGNOSIS — S0083XA Contusion of other part of head, initial encounter: Secondary | ICD-10-CM | POA: Insufficient documentation

## 2010-07-22 DIAGNOSIS — S20219A Contusion of unspecified front wall of thorax, initial encounter: Secondary | ICD-10-CM | POA: Insufficient documentation

## 2010-07-22 DIAGNOSIS — S0003XA Contusion of scalp, initial encounter: Secondary | ICD-10-CM | POA: Insufficient documentation

## 2010-07-22 DIAGNOSIS — J4489 Other specified chronic obstructive pulmonary disease: Secondary | ICD-10-CM | POA: Insufficient documentation

## 2010-07-22 DIAGNOSIS — IMO0002 Reserved for concepts with insufficient information to code with codable children: Secondary | ICD-10-CM | POA: Insufficient documentation

## 2010-07-22 DIAGNOSIS — K4090 Unilateral inguinal hernia, without obstruction or gangrene, not specified as recurrent: Secondary | ICD-10-CM | POA: Insufficient documentation

## 2010-07-22 DIAGNOSIS — S2239XA Fracture of one rib, unspecified side, initial encounter for closed fracture: Secondary | ICD-10-CM | POA: Insufficient documentation

## 2010-07-22 DIAGNOSIS — W208XXA Other cause of strike by thrown, projected or falling object, initial encounter: Secondary | ICD-10-CM | POA: Insufficient documentation

## 2010-07-22 LAB — DIFFERENTIAL
Basophils Absolute: 0.1 10*3/uL (ref 0.0–0.1)
Basophils Relative: 1 % (ref 0–1)
Eosinophils Absolute: 0.1 10*3/uL (ref 0.0–0.7)
Eosinophils Relative: 1 % (ref 0–5)
Lymphocytes Relative: 9 % — ABNORMAL LOW (ref 12–46)
Lymphs Abs: 0.8 10*3/uL (ref 0.7–4.0)
Monocytes Absolute: 0.9 10*3/uL (ref 0.1–1.0)
Monocytes Relative: 10 % (ref 3–12)
Neutro Abs: 7.3 10*3/uL (ref 1.7–7.7)
Neutrophils Relative %: 79 % — ABNORMAL HIGH (ref 43–77)

## 2010-07-22 LAB — CBC
HCT: 39.1 % (ref 39.0–52.0)
Hemoglobin: 13.2 g/dL (ref 13.0–17.0)
MCH: 31.6 pg (ref 26.0–34.0)
MCHC: 33.8 g/dL (ref 30.0–36.0)
MCV: 93.5 fL (ref 78.0–100.0)
Platelets: 195 10*3/uL (ref 150–400)
RBC: 4.18 MIL/uL — ABNORMAL LOW (ref 4.22–5.81)
RDW: 14.1 % (ref 11.5–15.5)
WBC: 9.2 10*3/uL (ref 4.0–10.5)

## 2010-07-22 LAB — APTT: aPTT: 34 seconds (ref 24–37)

## 2010-07-22 LAB — BASIC METABOLIC PANEL
BUN: 13 mg/dL (ref 6–23)
CO2: 25 mEq/L (ref 19–32)
Calcium: 9.4 mg/dL (ref 8.4–10.5)
Chloride: 103 mEq/L (ref 96–112)
Creatinine, Ser: 0.86 mg/dL (ref 0.4–1.5)
GFR calc Af Amer: >60 mL/min (ref >60)
GFR calc non Af Amer: >60 mL/min (ref >60)
Glucose, Bld: 92 mg/dL (ref 70–99)
Potassium: 4.1 mEq/L (ref 3.5–5.1)
Sodium: 138 mEq/L (ref 135–145)

## 2010-07-22 LAB — PROTIME-INR
INR: 0.96 (ref 0.00–1.49)
Prothrombin Time: 13 seconds (ref 11.6–15.2)

## 2010-07-22 MED ORDER — IOHEXOL 300 MG/ML  SOLN
100.0000 mL | Freq: Once | INTRAMUSCULAR | Status: AC | PRN
  Administered 2010-07-22: 100 mL via INTRAVENOUS

## 2010-07-23 ENCOUNTER — Telehealth: Payer: Self-pay | Admitting: Internal Medicine

## 2010-07-23 NOTE — Telephone Encounter (Signed)
Spoke with pt. 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 He had cxr and was advised had several cracked ribs. I advised that I will let MW know this, and that as long as he is feeling up to it, he should try his best to keep appt 08/24/10. Pt states that he intends to keep appt. Will forward to MW as Lorain Childes.

## 2010-07-24 NOTE — Telephone Encounter (Signed)
aware

## 2010-07-28 ENCOUNTER — Encounter: Payer: Self-pay | Admitting: Cardiology

## 2010-07-28 ENCOUNTER — Ambulatory Visit (INDEPENDENT_AMBULATORY_CARE_PROVIDER_SITE_OTHER): Payer: Medicare Other | Admitting: Cardiology

## 2010-07-28 DIAGNOSIS — E785 Hyperlipidemia, unspecified: Secondary | ICD-10-CM

## 2010-07-28 DIAGNOSIS — I2510 Atherosclerotic heart disease of native coronary artery without angina pectoris: Secondary | ICD-10-CM

## 2010-07-28 DIAGNOSIS — K922 Gastrointestinal hemorrhage, unspecified: Secondary | ICD-10-CM

## 2010-07-28 NOTE — Assessment & Plan Note (Signed)
He has not had a catheterization or stress testing since 2008. I will wait for him to recover from his recent rib fractures but would plan a stress test after the next 6 month visit. Until then he will continue with risk reduction.

## 2010-07-28 NOTE — Progress Notes (Signed)
HPI The patient presents for followup of his known coronary disease. When I last saw him he had had some bleeding from a Mallory-Weiss tear and Plavix and stopped. He has had no further problems with this. He continues to have no cardiovascular symptoms and he denies any chest pressure, neck or arm discomfort. He has had no new shortness of breath, PND or orthopnea. He has had no palpitations, presyncope or syncope. He has had recurrent pneumonias and unfortunately an accident with rib fractures.  Allergies  Allergen Reactions  . Penicillins   . Zetia (Ezetimibe)   . Zocor (Simvastatin) Other (See Comments)    h/a    Current Outpatient Prescriptions  Medication Sig Dispense Refill  . aspirin 81 MG tablet Take 81 mg by mouth daily.        . B Complex-C-Folic Acid (MULTIVITAMIN, STRESS FORMULA) tablet Take 1 tablet by mouth daily.        . bromocriptine (PARLODEL) 2.5 MG tablet Take 2.5 mg by mouth daily.        . budesonide-formoterol (SYMBICORT) 160-4.5 MCG/ACT inhaler Take 2 puffs first thing in am and then another 2 puffs about 12 hours later.     1 Inhaler  12  . famotidine (PEPCID) 20 MG tablet One at bedtime  30 tablet  11  . guaiFENesin (MUCINEX) 600 MG 12 hr tablet Take 1,200 mg by mouth 2 (two) times daily.        . hyoscyamine (LEVSIN SL) 0.125 MG SL tablet Place 0.125 mg under the tongue every 4 (four) hours as needed.        . iron polysaccharides (NIFEREX 60) 40-20 MG capsule Take 1 capsule by mouth daily with breakfast.        . levothyroxine (SYNTHROID, LEVOTHROID) 50 MCG tablet Take 50 mcg by mouth daily.        . montelukast (SINGULAIR) 10 MG tablet Take 10 mg by mouth daily as needed.        . nitroGLYCERIN (NITROSTAT) 0.4 MG SL tablet Place 0.4 mg under the tongue every 5 (five) minutes as needed.        . pantoprazole (PROTONIX) 40 MG tablet Take 1 tablet (40 mg total) by mouth daily.  30 tablet  2  . pravastatin (PRAVACHOL) 40 MG tablet Take 40 mg by mouth daily.          Marland Kitchen tiotropium (SPIRIVA) 18 MCG inhalation capsule Place 18 mcg into inhaler and inhale daily.          Past Medical History  Diagnosis Date  . Emphysema   . CAD (coronary artery disease)     left main had  a 20% stenosis.  Left anterior descending had 40-50% proximal   stenosis.  Left circumflex had an 80% proximal stenosis.  The  first obtuse marginal artery had a 60% mid stenosis.  The  right  coronary artery  had  a 50-60% proximal stenosis with a 60-70%  mid stenosis  The patient was managed medically.     Past Surgical History  Procedure Date  . Coronary stent placement   . Hiatal hernia repair   . Foot surgery     bilateral foot reconstruciton  . Abdominal hernia repair     ROS: Balance problems, recent pneumonias, fractured ribs.  Otherwise as stated in the HPI and negative for all other systems.  PHYSICAL EXAM BP 122/72  Pulse 76  Resp 18  Ht 5\' 7"  (1.702 m)  Wt 173 lb (78.472  kg)  BMI 27.10 kg/m2 GENERAL:  Well appearing HEENT:  Pupils equal round and reactive, fundi not visualized, oral mucosa unremarkable NECK:  No jugular venous distention, waveform within normal limits, carotid upstroke brisk and symmetric, no bruits, no thyromegaly LYMPHATICS:  No cervical, inguinal adenopathy LUNGS:  Clear to auscultation bilaterally BACK:  No CVA tenderness CHEST:  Unremarkable HEART:  PMI not displaced or sustained,S1 and S2 within normal limits, no S3, no S4, no clicks, no rubs, no murmurs ABD:  Flat, positive bowel sounds normal in frequency in pitch, no bruits, no rebound, no guarding, no midline pulsatile mass, no hepatomegaly, no splenomegaly EXT:  2 plus pulses upper, decreased DP/PT bilateral, mild edema, no cyanosis no clubbing SKIN:  No rashes no nodules, bruises from recent injury. NEURO:  Cranial nerves II through XII grossly intact, motor grossly intact throughout PSYCH:  Cognitively intact, oriented to person place and time   EKG:  Sinus rhythm, rate 84, axis  within normal limits, intervals within normal limits, no acute ST-T wave changes.  (06/21/10)   ASSESSMENT AND PLAN

## 2010-07-28 NOTE — Patient Instructions (Signed)
Please continue current medications Follow up with Dr Rollene Rotunda in [place] in 6 months

## 2010-07-28 NOTE — Assessment & Plan Note (Signed)
I will defer to Monica Becton, MD who watches this closely.

## 2010-07-29 ENCOUNTER — Encounter: Payer: Self-pay | Admitting: Cardiology

## 2010-08-24 ENCOUNTER — Encounter: Payer: Self-pay | Admitting: Internal Medicine

## 2010-08-24 ENCOUNTER — Ambulatory Visit (INDEPENDENT_AMBULATORY_CARE_PROVIDER_SITE_OTHER): Payer: Medicare Other | Admitting: Internal Medicine

## 2010-08-24 VITALS — BP 120/70 | HR 72 | Temp 97.6°F | Ht 67.0 in | Wt 168.0 lb

## 2010-08-24 DIAGNOSIS — J449 Chronic obstructive pulmonary disease, unspecified: Secondary | ICD-10-CM | POA: Insufficient documentation

## 2010-08-24 MED ORDER — BUDESONIDE-FORMOTEROL FUMARATE 160-4.5 MCG/ACT IN AERO: INHALATION_SPRAY | RESPIRATORY_TRACT | Status: DC

## 2010-08-24 MED ORDER — DOXYCYCLINE HYCLATE 100 MG PO TABS: ORAL_TABLET | ORAL | Status: DC

## 2010-08-24 NOTE — Assessment & Plan Note (Signed)
Doing much better on symbicort 160  2 bid and has mastered hfa.  Given pft's are more c/w either very mild copd or copd/ab he probably does not need spiriva at this point    Each maintenance medication was reviewed in detail including most importantly the difference between maintenance and as needed and under what circumstances the prns are to be used.  Please see instructions for details which were reviewed in writing and the patient given a copy.  The proper method of use, as well as anticipated side effects, of this metered-dose inhaler are discussed and demonstrated to the patient. Improved to 95% with coaching.   See instructions for specific recommendations which were reviewed directly with the patient who was given a copy with highlighter outlining the key components.

## 2010-08-24 NOTE — Progress Notes (Signed)
Subjective:    Patient ID: [name], male    DOB: 09-Jun-1932, 77 y.o.   MRN: [id number]  HPI  85 yowm quit smoking completely around 1980 (relatively light) with cough and short of breath eval by  Dr Sheppard Penton  Then [name] dx of copd/ bronchiectasis, no pft's on file.   07/05/2010 ov cc recurrent pna sine June 2011 cc not back to baseline in terms of activities he enjoyed in May, for example  Could do some yarwork then and rarely  Needed saba and no need for any maint rx,   but on  spriva for sob and still frequent tightness generalized front more than back,  Equal both sides  low grade fever and mucus gets thick and yellow> admit to [facility] April 24-27 by [facility] dx of ? Pna.   No sob at rest.   Continue protonix 40 mg  Take 30-60 min before first meal of the day and Pepcid 20 mg at bedtime   GERD (REFLUX) diet  Try symbicort 160 Take 2 puffs bid and work on hfa technique  08/24/2010 ov/Lancer Thurner/[name] cc breathing and cough better despite L rib fx 07/22/10. Main concern is how to prevent future exacerbations.  No limiting sob but not as active since quit smoking. Doing so much better    Sleeping ok without nocturnal  or early am exac of resp c/o's or need for noct saba. Pt denies any significant sore throat, dysphagia, itching, sneezing,  nasal congestion or excess/ purulent secretions,  fever, chills, sweats, unintended wt loss, pleuritic or exertional cp, hempoptysis, orthopnea pnd or leg swelling.    Also denies any obvious fluctuation of symptoms with weather or environmental changes or other aggravating or alleviating factors.       Past Medical History:  HYPERLIPIDEMIA (ICD-272.4)  CORONARY ARTERY DISEASE (ICD-414.00)  BENIGN PROSTATIC HYPERTROPHY, HX OF (ICD-V13.8)  DIVERTICULOSIS, MILD (ICD-562.10)  OSTEOPOROSIS (ICD-733.00)      - Took alendronate x 3 years, stopped due to GI effects DEGENERATIVE JOINT DISEASE (ICD-715.90)  ESOPHAGITIS (ICD-530.10)  ANEMIA (ICD-285.9)  COLONIC  POLYPS (ICD-211.3)  HYPERPROLACTINEMIA (ICD-253.1)  HYPOTHYROIDISM (ICD-244.9)  PITUITARY ADENOMA (ICD-227.3)  COPD (ICD-496)     - 08/24/2010 95% HFA ? Bonchiectasis RLL- CT 2009 > not verified on CT 07/22/10   Past Surgical History:  Inguinal herniorrhaphy  PTCA/stent  LEFT & RIGHT FOOT RECONSTRUCTION  hiatal hernia repair 2009   Family History:   neg for pituitary dz  mother died age 8 from phlebitis  father died age 33 from heart failure  8 siblings  alive age 15,80,89,92  1 died age 25 heart failure  3 died age 63,76,90 from heart problems   Social History:  married  2 children  retiired  never smoked  no etoh     Review of Systems     Objective:   Physical Exam Pleasant amb wm nad wt 167 07/05/2010  > 168 08/24/2010  HEENT mild turbinate edema.  Oropharynx no thrush or excess pnd or cobblestoning.  No JVD or cervical adenopathy. Mild accessory muscle hypertrophy. Trachea midline, nl thryroid. Chest was hyperinflated by percussion with diminished breath sounds and moderate increased exp time with bilteral sonorous rhonchi. Hoover sign positive at mid inspiration. Regular rate and rhythm without murmur gallop or rub or increase P2 or edema.  Abd: no hsm, nl excursion. Ext warm without cyanosis or clubbing.        CT Chest  07/22/10  1. Nondisplaced fracture of the left seventh rib.  No pleural  effusion or pneumothorax.  2. No evidence of mediastinal injury.  3. Chronic lung disease with possible mildly increased  peribronchial inflammation or atelectasis in both lower lobes.   Assessment & Plan:

## 2010-08-24 NOTE — Patient Instructions (Addendum)
Ok to stop spiriva unless it reduces your desired activity performance (it's like high octane fuel for your lungs so your can be all you can be)  Continue symbicort 160 Take 2 puffs first thing in am and then another 2 puffs about 12 hours later.    In event of recurrent signs of infection:  Change in sputum, fever, cough, short of breath or sore throat go ahead and take a course of doxycycline - if this doesn't help we need to see you in [place] (Lannis Lichtenwalner/ [name])    If you are satisfied with your treatment plan let your doctor know and he/she can either refill your medications or you can return here when your prescription runs out.     If in any way you are not 100% satisfied,  please tell us.  If 100% better, tell your friends!

## 2010-08-30 ENCOUNTER — Encounter: Payer: Self-pay | Admitting: Cardiology

## 2010-11-22 LAB — CBC
HCT: 31.6 — ABNORMAL LOW
Hemoglobin: 10.8 — ABNORMAL LOW
MCHC: 34.1
MCV: 92.7
Platelets: 194
RBC: 3.41 — ABNORMAL LOW
RDW: 14.7
WBC: 6.8

## 2010-11-22 LAB — COMPREHENSIVE METABOLIC PANEL
ALT: 16
AST: 26
Albumin: 2.8 — ABNORMAL LOW
Alkaline Phosphatase: 66
BUN: 11
CO2: 26
Calcium: 8.4
Chloride: 107
Creatinine, Ser: 0.95
GFR calc Af Amer: >60
GFR calc non Af Amer: >60
Glucose, Bld: 89
Potassium: 3.8
Sodium: 139
Total Bilirubin: 0.5
Total Protein: 5.4 — ABNORMAL LOW

## 2010-11-22 LAB — URINALYSIS, ROUTINE W REFLEX MICROSCOPIC
Bilirubin Urine: NEGATIVE
Glucose, UA: NEGATIVE
Hgb urine dipstick: NEGATIVE
Ketones, ur: NEGATIVE
Nitrite: NEGATIVE
Protein, ur: NEGATIVE
Specific Gravity, Urine: 1.007
Urobilinogen, UA: 0.2
pH: 7

## 2010-11-22 LAB — POCT CARDIAC MARKERS
CKMB, poc: <1 — ABNORMAL LOW
Myoglobin, poc: 72.6
Operator id: 133351
Troponin i, poc: <0.05

## 2010-11-22 LAB — DIFFERENTIAL
Basophils Absolute: 0.1
Basophils Relative: 1
Eosinophils Absolute: 0.2
Eosinophils Relative: 3
Lymphocytes Relative: 27
Lymphs Abs: 1.8
Monocytes Absolute: 0.8
Monocytes Relative: 12
Neutro Abs: 4
Neutrophils Relative %: 58

## 2010-11-22 LAB — LIPASE, BLOOD: Lipase: 20

## 2010-11-24 ENCOUNTER — Encounter: Payer: Self-pay | Admitting: Gastroenterology

## 2010-11-25 LAB — DIFFERENTIAL
Basophils Absolute: 0
Basophils Relative: 0
Eosinophils Absolute: 0.1
Eosinophils Relative: 1
Lymphocytes Relative: 13
Lymphs Abs: 1
Monocytes Absolute: 0.6
Monocytes Relative: 8
Neutro Abs: 5.8
Neutrophils Relative %: 78 — ABNORMAL HIGH

## 2010-11-25 LAB — POCT CARDIAC MARKERS
CKMB, poc: <1 — ABNORMAL LOW
Myoglobin, poc: 68.4
Operator id: 272551
Troponin i, poc: <0.05

## 2010-11-25 LAB — COMPREHENSIVE METABOLIC PANEL
ALT: 15
AST: 16
Albumin: 2.8 — ABNORMAL LOW
Alkaline Phosphatase: 50
BUN: 10
CO2: 22
Calcium: 8 — ABNORMAL LOW
Chloride: 113 — ABNORMAL HIGH
Creatinine, Ser: 1.02
GFR calc Af Amer: >60
GFR calc non Af Amer: >60
Glucose, Bld: 80
Potassium: 3.8
Sodium: 140
Total Bilirubin: 0.8
Total Protein: 5.3 — ABNORMAL LOW

## 2010-11-25 LAB — CORTISOL
Cortisol, Plasma: 11.3
Cortisol, Plasma: 24.4
Cortisol, Plasma: 4.2

## 2010-11-25 LAB — TROPONIN I: Troponin I: <0.01

## 2010-11-25 LAB — CBC
HCT: 31.3 — ABNORMAL LOW
HCT: 31.5 — ABNORMAL LOW
Hemoglobin: 10.6 — ABNORMAL LOW
Hemoglobin: 10.6 — ABNORMAL LOW
MCHC: 33.6
MCHC: 33.8
MCV: 92.3
MCV: 93.3
Platelets: 175
Platelets: 180
RBC: 3.38 — ABNORMAL LOW
RBC: 3.39 — ABNORMAL LOW
RDW: 14.7
RDW: 14.8
WBC: 7.3
WBC: 7.4

## 2010-11-25 LAB — POCT I-STAT, CHEM 8
BUN: 11
Calcium, Ion: 1.12
Chloride: 108
Creatinine, Ser: 1.1
Glucose, Bld: 90
HCT: 32 — ABNORMAL LOW
Hemoglobin: 10.9 — ABNORMAL LOW
Potassium: 4.1
Sodium: 141
TCO2: 20

## 2010-11-25 LAB — CK TOTAL AND CKMB (NOT AT ARMC)
CK, MB: 1
Relative Index: INVALID
Total CK: 64

## 2010-11-25 LAB — BASIC METABOLIC PANEL
BUN: 9
CO2: 25
Calcium: 8.9
Chloride: 107
Creatinine, Ser: 0.95
GFR calc Af Amer: >60
GFR calc non Af Amer: >60
Glucose, Bld: 92
Potassium: 3.9
Sodium: 137

## 2010-11-25 LAB — CARDIAC PANEL(CRET KIN+CKTOT+MB+TROPI)
CK, MB: 1.4
CK, MB: 1.4
Relative Index: INVALID
Relative Index: INVALID
Total CK: 69
Total CK: 80
Troponin I: <0.01
Troponin I: <0.01

## 2010-11-25 LAB — PROTIME-INR
INR: 1.1
Prothrombin Time: 14.7

## 2010-11-25 LAB — TSH: TSH: 1.674

## 2010-11-25 LAB — APTT: aPTT: 29

## 2010-11-25 LAB — HEMOGLOBIN AND HEMATOCRIT, BLOOD
HCT: 35.5 — ABNORMAL LOW
Hemoglobin: 11.9 — ABNORMAL LOW

## 2010-11-25 LAB — T4, FREE: Free T4: 1.14

## 2010-11-25 LAB — T3, FREE: T3, Free: 2.6 (ref 2.3–4.2)

## 2010-11-26 LAB — BASIC METABOLIC PANEL
BUN: 10
CO2: 25
Calcium: 8.4
Chloride: 113 — ABNORMAL HIGH
Creatinine, Ser: 1.15
GFR calc Af Amer: >60
GFR calc non Af Amer: >60
Glucose, Bld: 89
Potassium: 3.9
Sodium: 142

## 2010-11-26 LAB — CBC
HCT: 33.2 — ABNORMAL LOW
Hemoglobin: 11.3 — ABNORMAL LOW
MCHC: 33.9
MCV: 93.2
Platelets: 189
RBC: 3.57 — ABNORMAL LOW
RDW: 14.7
WBC: 6.3

## 2010-11-26 LAB — PROLACTIN: Prolactin: 4.3 (ref 2.1–17.1)

## 2010-11-30 LAB — CBC
HCT: 32.4 — ABNORMAL LOW
HCT: 35.9 — ABNORMAL LOW
HCT: 36.1 — ABNORMAL LOW
HCT: 36.8 — ABNORMAL LOW
HCT: 37.1 — ABNORMAL LOW
Hemoglobin: 10.9 — ABNORMAL LOW
Hemoglobin: 12.1 — ABNORMAL LOW
Hemoglobin: 12.1 — ABNORMAL LOW
Hemoglobin: 12.3 — ABNORMAL LOW
Hemoglobin: 12.5 — ABNORMAL LOW
MCHC: 33.4
MCHC: 33.4
MCHC: 33.6
MCHC: 33.7
MCHC: 33.8
MCV: 93.2
MCV: 93.4
MCV: 94
MCV: 94.1
MCV: 94.4
Platelets: 184
Platelets: 187
Platelets: 196
Platelets: 198
Platelets: 211
RBC: 3.47 — ABNORMAL LOW
RBC: 3.82 — ABNORMAL LOW
RBC: 3.84 — ABNORMAL LOW
RBC: 3.93 — ABNORMAL LOW
RBC: 3.95 — ABNORMAL LOW
RDW: 14.5
RDW: 14.7
RDW: 14.8
RDW: 15
RDW: 15
WBC: 10.5
WBC: 11.7 — ABNORMAL HIGH
WBC: 6.2
WBC: 8.2
WBC: 8.4

## 2010-11-30 LAB — BASIC METABOLIC PANEL
BUN: 10
BUN: 9
CO2: 21
CO2: 26
Calcium: 7.9 — ABNORMAL LOW
Calcium: 8.8
Chloride: 108
Chloride: 109
Creatinine, Ser: 0.82
Creatinine, Ser: 0.99
GFR calc Af Amer: >60
GFR calc Af Amer: >60
GFR calc non Af Amer: >60
GFR calc non Af Amer: >60
Glucose, Bld: 111 — ABNORMAL HIGH
Glucose, Bld: 98
Potassium: 4
Potassium: 4
Sodium: 137
Sodium: 140

## 2010-11-30 LAB — DIFFERENTIAL
Basophils Absolute: 0
Basophils Absolute: 0.1
Basophils Relative: 1
Basophils Relative: 1
Eosinophils Absolute: 0.2
Eosinophils Absolute: 0.2
Eosinophils Relative: 2
Eosinophils Relative: 2
Lymphocytes Relative: 15
Lymphocytes Relative: 8 — ABNORMAL LOW
Lymphs Abs: 0.8
Lymphs Abs: 1.3
Monocytes Absolute: 0.9
Monocytes Absolute: 1
Monocytes Relative: 12
Monocytes Relative: 8
Neutro Abs: 5.9
Neutro Abs: 8.6 — ABNORMAL HIGH
Neutrophils Relative %: 70
Neutrophils Relative %: 82 — ABNORMAL HIGH

## 2010-11-30 LAB — URINALYSIS, ROUTINE W REFLEX MICROSCOPIC
Glucose, UA: NEGATIVE
Ketones, ur: >80 — AB
Leukocytes, UA: NEGATIVE
Nitrite: NEGATIVE
Protein, ur: 30 — AB
Specific Gravity, Urine: 1.03
Urobilinogen, UA: 2 — ABNORMAL HIGH
pH: 6.5

## 2010-11-30 LAB — COMPREHENSIVE METABOLIC PANEL
ALT: 62 — ABNORMAL HIGH
AST: 42 — ABNORMAL HIGH
Albumin: 2.8 — ABNORMAL LOW
Alkaline Phosphatase: 69
BUN: 7
CO2: 21
Calcium: 8.5
Chloride: 108
Creatinine, Ser: 0.93
GFR calc Af Amer: >60
GFR calc non Af Amer: >60
Glucose, Bld: 128 — ABNORMAL HIGH
Potassium: 3.7
Sodium: 138
Total Bilirubin: 1.2
Total Protein: 6.1

## 2010-11-30 LAB — URINE MICROSCOPIC-ADD ON

## 2010-11-30 LAB — LIPASE, BLOOD: Lipase: 11

## 2010-11-30 LAB — APTT: aPTT: 37

## 2010-11-30 LAB — MAGNESIUM: Magnesium: 2.1

## 2010-11-30 LAB — PROTIME-INR
INR: 1.1
Prothrombin Time: 14.5

## 2010-11-30 LAB — PHOSPHORUS: Phosphorus: 3.2

## 2010-12-06 LAB — POCT I-STAT GLUCOSE
Glucose, Bld: 86
Operator id: 133881

## 2010-12-20 ENCOUNTER — Encounter: Payer: Self-pay | Admitting: Gastroenterology

## 2010-12-20 ENCOUNTER — Ambulatory Visit (AMBULATORY_SURGERY_CENTER): Payer: Medicare Other

## 2010-12-20 VITALS — Ht 67.0 in | Wt 160.0 lb

## 2010-12-20 DIAGNOSIS — Z1211 Encounter for screening for malignant neoplasm of colon: Secondary | ICD-10-CM

## 2010-12-20 MED ORDER — PEG-KCL-NACL-NASULF-NA ASC-C 100 G PO SOLR: 1.0000 | Freq: Once | ORAL | Status: DC

## 2010-12-21 ENCOUNTER — Ambulatory Visit: Payer: Medicare Other | Attending: Orthopedic Surgery | Admitting: Physical Therapy

## 2010-12-21 DIAGNOSIS — M25659 Stiffness of unspecified hip, not elsewhere classified: Secondary | ICD-10-CM | POA: Insufficient documentation

## 2010-12-21 DIAGNOSIS — R5381 Other malaise: Secondary | ICD-10-CM | POA: Insufficient documentation

## 2010-12-21 DIAGNOSIS — IMO0001 Reserved for inherently not codable concepts without codable children: Secondary | ICD-10-CM | POA: Insufficient documentation

## 2010-12-21 DIAGNOSIS — M25559 Pain in unspecified hip: Secondary | ICD-10-CM | POA: Insufficient documentation

## 2010-12-23 ENCOUNTER — Other Ambulatory Visit: Payer: Self-pay | Admitting: Endocrinology

## 2010-12-24 ENCOUNTER — Ambulatory Visit: Payer: Medicare Other | Admitting: *Deleted

## 2011-01-03 ENCOUNTER — Encounter: Payer: Self-pay | Admitting: Gastroenterology

## 2011-01-03 ENCOUNTER — Ambulatory Visit (AMBULATORY_SURGERY_CENTER): Payer: Medicare Other | Admitting: Gastroenterology

## 2011-01-03 DIAGNOSIS — K5730 Diverticulosis of large intestine without perforation or abscess without bleeding: Secondary | ICD-10-CM

## 2011-01-03 DIAGNOSIS — Z1211 Encounter for screening for malignant neoplasm of colon: Secondary | ICD-10-CM

## 2011-01-03 DIAGNOSIS — Z860100 Personal history of colon polyps, unspecified: Secondary | ICD-10-CM

## 2011-01-03 MED ORDER — SODIUM CHLORIDE 0.9 % IV SOLN: 500.0000 mL | INTRAVENOUS | Status: DC

## 2011-01-03 NOTE — Patient Instructions (Signed)
Please refer to the blue and neon green sheets for instructions regarding diet and activity for the rest of today.  Handouts on diverticulosis and a high fiber diet given. Resume previous medications. 

## 2011-01-04 ENCOUNTER — Telehealth: Payer: Self-pay

## 2011-01-04 NOTE — Telephone Encounter (Signed)

## 2011-03-15 ENCOUNTER — Other Ambulatory Visit: Payer: Self-pay | Admitting: Endocrinology

## 2011-03-23 ENCOUNTER — Ambulatory Visit: Payer: Medicare Other | Attending: Family Medicine | Admitting: Physical Therapy

## 2011-03-23 DIAGNOSIS — IMO0001 Reserved for inherently not codable concepts without codable children: Secondary | ICD-10-CM | POA: Insufficient documentation

## 2011-03-23 DIAGNOSIS — R42 Dizziness and giddiness: Secondary | ICD-10-CM | POA: Insufficient documentation

## 2011-03-23 DIAGNOSIS — R5381 Other malaise: Secondary | ICD-10-CM | POA: Insufficient documentation

## 2011-03-23 DIAGNOSIS — M25559 Pain in unspecified hip: Secondary | ICD-10-CM | POA: Insufficient documentation

## 2011-03-24 ENCOUNTER — Ambulatory Visit: Payer: Medicare Other | Admitting: Physical Therapy

## 2011-03-28 ENCOUNTER — Ambulatory Visit: Payer: Medicare Other | Admitting: Physical Therapy

## 2011-03-31 ENCOUNTER — Ambulatory Visit: Payer: Medicare Other | Admitting: Physical Therapy

## 2011-04-04 ENCOUNTER — Ambulatory Visit: Payer: Medicare Other | Attending: Family Medicine | Admitting: Physical Therapy

## 2011-04-04 DIAGNOSIS — M25559 Pain in unspecified hip: Secondary | ICD-10-CM | POA: Insufficient documentation

## 2011-04-04 DIAGNOSIS — IMO0001 Reserved for inherently not codable concepts without codable children: Secondary | ICD-10-CM | POA: Insufficient documentation

## 2011-04-04 DIAGNOSIS — R5381 Other malaise: Secondary | ICD-10-CM | POA: Insufficient documentation

## 2011-04-04 DIAGNOSIS — R42 Dizziness and giddiness: Secondary | ICD-10-CM | POA: Insufficient documentation

## 2011-04-05 ENCOUNTER — Encounter: Payer: Self-pay | Admitting: Internal Medicine

## 2011-04-05 ENCOUNTER — Ambulatory Visit: Payer: Medicare Other | Admitting: Internal Medicine

## 2011-04-05 ENCOUNTER — Ambulatory Visit (INDEPENDENT_AMBULATORY_CARE_PROVIDER_SITE_OTHER): Payer: Medicare Other | Admitting: Internal Medicine

## 2011-04-05 VITALS — BP 112/80 | HR 67 | Temp 97.1°F | Ht 67.0 in | Wt 174.0 lb

## 2011-04-05 DIAGNOSIS — J449 Chronic obstructive pulmonary disease, unspecified: Secondary | ICD-10-CM

## 2011-04-05 MED ORDER — ALBUTEROL SULFATE HFA 108 (90 BASE) MCG/ACT IN AERS: 2.0000 | INHALATION_SPRAY | Freq: Four times a day (QID) | RESPIRATORY_TRACT | Status: DC | PRN

## 2011-04-05 NOTE — Progress Notes (Signed)
Subjective:    Patient ID: [name], male    DOB: 1932/10/22   MRN: [id number]    Brief patient profile:  78 yowm quit smoking completely around 1980 (relatively light) with cough and short of breath eval by  Dr Sheppard Penton  Then [name] dx of copd/ cb  pft's showed fev1 116% 02/2010 though ratio 66 c/w GOLD I severity.   07/05/2010 ov cc recurrent pna sine June 2011 cc not back to baseline in terms of activities he enjoyed in May, for example  Could do some yardwork  and rarely  Needed saba and no need for any maint rx,   but on  spriva for sob and still frequent tightness generalized front more than back,  Equal both sides  low grade fever and mucus gets thick and yellow> admit to [facility] April 24-27 by [facility] dx of ? Pna.   No sob at rest.   Continue protonix 40 mg  Take 30-60 min before first meal of the day and Pepcid 20 mg at bedtime  GERD (REFLUX) diet  Try symbicort 160 Take 2 puffs bid and work on hfa technique  08/24/2010 ov/[name]/[name] cc breathing and cough better despite L rib fx 07/22/10. Main concern is how to prevent future exacerbations.  No limiting sob but not as active since quit smoking. Doing so much better overall rec  Ok to stop spiriva unless it reduces your desired activity performance (it's like high octane fuel for your lungs so your can be all you can be)  Continue symbicort 160 Take 2 puffs first thing in am and then another 2 puffs about 12 hours later.    In event of recurrent signs of infection:  Change in sputum, fever, cough, short of breath or sore throat go ahead and take a course of doxycycline - if this doesn't help we need to see you in [place] ([name]/ [name]) F/u prn   04/05/2011 f/u ov/[name] reports one use of doxy cc breathing/ coughing "the best it's been in years" on ppi qam and prn pepcid at hs and symbicort 160 2 qam and 2qhs prn and no need for albuterol.   Sleeping ok without nocturnal  or early am exac of resp c/o's or need for noct saba. Pt  denies any significant sore throat, dysphagia, itching, sneezing,  nasal congestion or excess/ purulent secretions,  fever, chills, sweats, unintended wt loss, pleuritic or exertional cp, hempoptysis, orthopnea pnd or leg swelling.    Also denies any obvious fluctuation of symptoms with weather or environmental changes or other aggravating or alleviating factors.      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 Bonchiectasis RLL- CT 2009 > not verified on CT 07/22/10   Past Surgical History:  Inguinal herniorrhaphy  PTCA/stent  LEFT & RIGHT FOOT RECONSTRUCTION  hiatal hernia repair 2009   Family History:  neg for pituitary dz  mother died age 22 from phlebitis  father died age 40 from heart failure  8 siblings  alive age 10,80,89,92  1 died age 22 heart failure  3 died age 60,76,90 from  heart problems    Social History:  married  2 children  retiired  never smoked  no etoh         Objective:   Physical Exam Pleasant amb wm nad wt 167 07/05/2010  > 168 08/24/2010 > 04/05/2011 174 HEENT mild turbinate edema.  Oropharynx no thrush or excess pnd or cobblestoning.  No JVD or cervical adenopathy. Mild accessory muscle hypertrophy. Trachea midline, nl thryroid. Chest was hyperinflated by percussion with diminished breath sounds and moderate increased exp time with bilteral sonorous rhonchi. Hoover sign positive at mid inspiration. Regular rate and rhythm without murmur gallop or rub or increase P2 or edema.  Abd: no hsm, nl  excursion. Ext warm without cyanosis or clubbing.        CT Chest  07/22/10  1. Nondisplaced fracture of the left seventh rib. No pleural  effusion or pneumothorax.  2. No evidence of mediastinal injury.  3. Chronic lung disease with possible mildly increased  peribronchial inflammation or atelectasis in both lower lobes.   Assessment & Plan:

## 2011-04-05 NOTE — Patient Instructions (Signed)
No change in medications needed   If you are satisfied with your treatment plan let your doctor know and he/she can either refill your medications or you can return here when your prescription runs out.     If in any way you are not 100% satisfied,  please tell us.  If 100% better, tell your friends!   Pulmonary follow up can be as needed

## 2011-04-05 NOTE — Assessment & Plan Note (Signed)
-   Spirometry 03/30/2010 FEV1  2.86 (116%) ratio 66     - HFA 95% 08/24/2010   So he has very minimal copd with CB features but no bronchiectasis on ct 07/18/10 and much improved on symbicort used to stabilize airways reactivity and promote better mucociliary function.    Each maintenance medication was reviewed in detail including most importantly the difference between maintenance and as needed and under what circumstances the prns are to be used.  Please see instructions for details which were reviewed in writing and the patient given a copy.

## 2011-04-08 ENCOUNTER — Encounter: Payer: Medicare Other | Admitting: *Deleted

## 2011-04-11 ENCOUNTER — Ambulatory Visit: Payer: Medicare Other | Admitting: Physical Therapy

## 2011-04-15 ENCOUNTER — Ambulatory Visit: Payer: Medicare Other | Admitting: Physical Therapy

## 2011-04-18 ENCOUNTER — Ambulatory Visit: Payer: Medicare Other | Admitting: Physical Therapy

## 2011-04-19 ENCOUNTER — Other Ambulatory Visit: Payer: Self-pay | Admitting: Endocrinology

## 2011-04-22 ENCOUNTER — Ambulatory Visit: Payer: Medicare Other | Admitting: Physical Therapy

## 2011-05-25 ENCOUNTER — Other Ambulatory Visit: Payer: Self-pay | Admitting: Endocrinology

## 2011-07-11 ENCOUNTER — Encounter (HOSPITAL_COMMUNITY): Payer: Self-pay | Admitting: Pharmacy Technician

## 2011-07-12 ENCOUNTER — Ambulatory Visit (INDEPENDENT_AMBULATORY_CARE_PROVIDER_SITE_OTHER): Payer: Medicare Other | Admitting: Internal Medicine

## 2011-07-12 ENCOUNTER — Encounter: Payer: Self-pay | Admitting: Internal Medicine

## 2011-07-12 VITALS — BP 104/64 | HR 62 | Temp 97.6°F | Ht 66.0 in | Wt 176.2 lb

## 2011-07-12 DIAGNOSIS — J449 Chronic obstructive pulmonary disease, unspecified: Secondary | ICD-10-CM

## 2011-07-12 NOTE — Progress Notes (Signed)
Subjective:    Patient ID: [name], male    DOB: 08/13/1932   MRN: [id number]    Brief patient profile:  78 yowm quit smoking completely around 1980 (relatively light) with cough and short of breath eval by  [name]  Then [name] dx of copd/ cb  pft's showed fev1 116% 02/2010 though ratio 66 c/w GOLD I severity.   07/05/2010 ov cc recurrent pna sine June 2011 cc not back to baseline in terms of activities he enjoyed in May, for example  Could do some yardwork  and rarely  Needed saba and no need for any maint rx,   but on  spriva for sob and still frequent tightness generalized front more than back,  Equal both sides  low grade fever and mucus gets thick and yellow> admit to [facility] April 24-27 by [facility] dx of ? Pna.   No sob at rest.   Continue protonix 40 mg  Take 30-60 min before first meal of the day and Pepcid 20 mg at bedtime GERD (REFLUX) diet Try symbicort 160 Take 2 puffs bid and work on hfa technique  08/24/2010 ov/[name]/[name] cc breathing and cough better despite L rib fx 07/22/10. Main concern is how to prevent future exacerbations.  No limiting sob but not as active since quit smoking. Doing so much better overall rec  Ok to stop spiriva unless it reduces your desired activity performance (it's like high octane fuel for your lungs so your can be all you can be) Continue symbicort 160 Take 2 puffs first thing in am and then another 2 puffs about 12 hours later.    In event of recurrent signs of infection:  Change in sputum, fever, cough, short of breath or sore throat go ahead and take a course of doxycycline - if this doesn't help we need to see you in [name] ([name]/ [name]) F/u prn   04/05/2011 f/u ov/[name] reports one use of doxy cc breathing/ coughing "the best it's been in years" on ppi qam and prn pepcid at hs and symbicort 160 2 qam and 2qhs prn and no need for albuterol. rec No change in rx F/u prn   07/12/2011 f/u ov/[name] cc dyspnea and cough "the best they've been in  years"  here for preop eval for L hip on 23rd May by [name],  used 2 rounds of abx since last ov for purulent sputum which cleared  and symbicort 2 bid and one mucinex and generally sleeping well,  But since pollen season increased sub wheezing so started singulair > resolved. No daytime need for saba at all at this point  Sleeping ok without nocturnal  or early am exacerbation  of respiratory  c/o's or need for noct saba. Also denies any obvious fluctuation of symptoms with weather or environmental changes or other aggravating or alleviating factors except as outlined above.  ROS  At present neg for  any significant sore throat, dysphagia, dental problems, itching, sneezing,  nasal congestion or excess/ purulent secretions, ear ache,   fever, chills, sweats, unintended wt loss, pleuritic or exertional cp, hemoptysis, palpitations, orthopnea pnd or leg swelling.  Also denies presyncope, palpitations, heartburn, abdominal pain, anorexia, nausea, vomiting, diarrhea  or change in bowel or urinary habits, change in stools or urine, dysuria,hematuria,  rash, arthralgias, visual complaints, headache, numbness weakness or ataxia or problems with walking or coordination. No noted change in mood/affect or memory.         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 Bonchiectasis RLL- CT 2009 > not verified on CT 07/22/10   Past Surgical History:  Inguinal herniorrhaphy  PTCA/stent  LEFT & RIGHT FOOT RECONSTRUCTION  hiatal hernia repair 2009    Family History:  neg for pituitary dz  mother died age 76 from phlebitis  father died age 75 from heart failure  8 siblings  alive age 71,80,89,92  1 died age 72 heart failure  3 died age 75,76,90 from heart problems    Social History:  married  2 children  retiired  never smoked  no etoh         Objective:   Physical Exam [facility] nad wt 167 07/05/2010  > 168 08/24/2010 > 04/05/2011 174> 07/12/2011  176 HEENT mild turbinate edema.  Oropharynx no thrush or excess pnd or cobblestoning.  No JVD or cervical adenopathy. Min accessory muscle hypertrophy. Trachea midline, nl thryroid. Chest was min hyperinflated by percussion with diminished breath sounds and moderate increased exp time with min exp bilteral sonorous rhonchi. Hoover sign positive at end inspiration. Regular rate and rhythm without murmur gallop or rub or increase P2 or edema.  Abd: no hsm, nl excursion. Ext warm without cyanosis or clubbing.        CT Chest  07/22/10  1. Nondisplaced fracture of the left seventh rib. No pleural  effusion or pneumothorax.  2. No evidence of mediastinal injury.  3. Chronic lung disease with possible mildly increased  peribronchial inflammation or atelectasis in both lower lobes.   Assessment & Plan:

## 2011-07-12 NOTE — Patient Instructions (Signed)
You are cleared for surgery - we will see you post operatively as needed  Between now and then be sure to continue the singulair each day and Pepcid each night.

## 2011-07-12 NOTE — Assessment & Plan Note (Signed)
-   Spirometry 03/30/2010 FEV1  2.86 (116%) ratio 66     - HFA 95% 07/12/2011   GOLD I copd with freq flares of bronchitis controlled well with symbicort and prn singulair during his allergy season with no need for saba  The proper method of use, as well as anticipated side effects, of a metered-dose inhaler are discussed and demonstrated to the patient. Improved effectiveness after extensive coaching during this visit to a level of approximately  95%  Discussed in detail all the  indications, usual  risks and alternatives  relative to the benefits with patient who agrees to proceed with hip surgery with a relatively low risk of complications assuming no flare in symptoms between now and then > should use singulair and hs pepcid consistently though in interim.  Key is early mobilization post op with min sedation and ok to use duoneb qid periop, pccm svc to see prn

## 2011-07-13 ENCOUNTER — Ambulatory Visit (INDEPENDENT_AMBULATORY_CARE_PROVIDER_SITE_OTHER): Payer: Medicare Other | Admitting: Physician Assistant

## 2011-07-13 ENCOUNTER — Telehealth: Payer: Self-pay | Admitting: Gastroenterology

## 2011-07-13 ENCOUNTER — Encounter: Payer: Self-pay | Admitting: Physician Assistant

## 2011-07-13 ENCOUNTER — Ambulatory Visit: Payer: Medicare Other | Admitting: Physician Assistant

## 2011-07-13 VITALS — BP 122/76 | HR 66 | Ht 66.0 in | Wt 174.0 lb

## 2011-07-13 DIAGNOSIS — J449 Chronic obstructive pulmonary disease, unspecified: Secondary | ICD-10-CM

## 2011-07-13 DIAGNOSIS — E785 Hyperlipidemia, unspecified: Secondary | ICD-10-CM

## 2011-07-13 DIAGNOSIS — Z01818 Encounter for other preprocedural examination: Secondary | ICD-10-CM

## 2011-07-13 DIAGNOSIS — I2510 Atherosclerotic heart disease of native coronary artery without angina pectoris: Secondary | ICD-10-CM

## 2011-07-13 NOTE — Progress Notes (Signed)
HPI:  This is a 78 year old white male patient who is here today for cardiac clearance prior to undergoing left hip replacement by Dr. Vernie Murders on 07/21/11. He has a history of coronary artery disease status post drug-eluting stent to the LAD in 2008. Followup catheter a year later as part of the study showed widely patent stent. The RCA had a high grade PDA lesion which was a small vessel. He had a well-preserved ejection fraction. He developed to GI bleed that resulted in stopping his Plavix and Actonel. A. Dr. Antoine Poche in May 2012 at which time he wanted to do a stress test but held off because the patient had broken 5 ribs.  The patient states he's had 3 episodes of mild chest pressure that occurred at rest and eased very quickly spontaneously. These episodes occurred a week apart and at rest. They did feel similar to when he had his angina after his stent placement. He has chronic dyspnea on exertion secondary to COPD. He has not had exertional chest pressure. He denies dizziness, palpitations, or presyncope.The last episode occurred 3 weeks ago. It's hard for him to distinguish between his hiatal hernia and angina.   Allergies:  -- Penicillins   -- Zetia (Ezetimibe)   -- Zocor (Simvastatin) -- Other (See Comments)   --  h/a  Current Outpatient Prescriptions on File Prior to Visit: albuterol (PROAIR HFA) 108 (90 BASE) MCG/ACT inhaler, Inhale 2 puffs into the lungs every 6 (six) hours as needed for wheezing., Disp: , Rfl:  aspirin 81 MG tablet, Take 81 mg by mouth at bedtime. , Disp: , Rfl:  bromocriptine (PARLODEL) 2.5 MG tablet, Take 2.5 mg by mouth at bedtime., Disp: , Rfl:  budesonide-formoterol (SYMBICORT) 160-4.5 MCG/ACT inhaler, Take 2 puffs first thing in am and then another 2 puffs about 12 hours later. , Disp: 1 Inhaler, Rfl: 12 cholecalciferol (VITAMIN D) 1000 UNITS tablet, Take 1,000 Units by mouth 2 (two) times daily., Disp: , Rfl:  famotidine (PEPCID) 20 MG tablet, Take 20  mg by mouth at bedtime as needed. One at bedtime, Disp: , Rfl:  guaiFENesin (MUCINEX) 600 MG 12 hr tablet, Take 600 mg by mouth. QD-BID PRN, Disp: , Rfl:  hyoscyamine (LEVSIN SL) 0.125 MG SL tablet, Place 0.125 mg under the tongue every 4 (four) hours as needed. For stomach cramps, Disp: , Rfl:  iron polysaccharides (NIFEREX 60) 40-20 MG capsule, Take 1 capsule by mouth daily with breakfast.  , Disp: , Rfl:  levothyroxine (SYNTHROID, LEVOTHROID) 50 MCG tablet, Take 50 mcg by mouth daily with breakfast., Disp: , Rfl:  montelukast (SINGULAIR) 10 MG tablet, Take 10 mg by mouth daily as needed. For allergies, Disp: , Rfl:  Multiple Vitamin (MULITIVITAMIN WITH MINERALS) TABS, Take 1 tablet by mouth every evening., Disp: , Rfl:  nitroGLYCERIN (NITROSTAT) 0.4 MG SL tablet, Place 0.4 mg under the tongue every 5 (five) minutes as needed.  , Disp: , Rfl:  pantoprazole (PROTONIX) 40 MG tablet, Take 40 mg by mouth daily with breakfast., Disp: , Rfl:  pravastatin (PRAVACHOL) 40 MG tablet, Take 20 mg by mouth daily. , Disp: , Rfl:     Past Medical History:   Emphysema                                                    CAD (coronary artery  disease)                                  Comment:left main had  a 20% stenosis.  Left anterior               descending had 40-50% proximal   stenosis.                Left circumflex had an 80% proximal stenosis.                The  first obtuse marginal artery had a 60% mid              stenosis.  The  right  coronary artery  had  a               50-60% proximal stenosis with a 60-70%  mid               stenosis  The patient was managed medically.   Past Surgical History:   CORONARY STENT PLACEMENT                                     HIATAL HERNIA REPAIR                                         FOOT SURGERY                                                   Comment:bilateral foot reconstruciton   ABDOMINAL HERNIA REPAIR                                     Review of  patient's family history indicates:   Heart disease                  Sister                   Heart failure                  Father                   Colon cancer                   Neg Hx                   Social History   Marital Status: Married             Spouse Name:                      Years of Education:                 Number of children:             Occupational History Occupation          Psychiatric nurse  retired                                   Social History Main Topics   Smoking Status: Never Smoker                     Smokeless Status: Never Used                       Alcohol Use: No             Drug Use: No             Sexual Activity: Not on file        Other Topics            Concern   None on file  Social History Narrative   None on file    ROS: See HPI Eyes: Negative Ears:Negative for hearing loss, tinnitus Cardiovascular: Negative for palpitations,irregular heartbeat, near-syncope, orthopnea, paroxysmal nocturnal dyspnea and syncope,edema, claudication, cyanosis,.  Respiratory:   Chronic dyspnea on exertion secondary COPD,Negative for cough, hemoptysis, sleep disturbances due to breathing, sputum production and wheezing.   Endocrine: Negative for cold intolerance and heat intolerance.  Hematologic/Lymphatic: Negative for adenopathy and bleeding problem. Does not bruise/bleed easily.  Musculoskeletal: left hip pain awaiting surgery.   Gastrointestinal:history of Mallory-Weiss tear and bleed, history of hilar hernia and indigestion Negative for nausea, vomiting, abdominal pain, diarrhea, constipation.   Neurological: Patient is frequently off balance and has a tendency to fall. He denies dizziness.  Allergic/Immunologic: Negative for environmental allergies.   PHYSICAL EXAM: Well-nournished, in no acute distress. Neck: No JVD, HJR, Bruit, or thyroid enlargement  Lungs: No tachypnea, clear without wheezing, rales, or  rhonchi  Cardiovascular: RRR, PMI not displaced,positive S4-1 2/6 systolic murmur at the left sternal border, no bruit, thrill, or heave.  Abdomen: BS normal. Soft without organomegaly, masses, lesions or tenderness.  Extremities: without cyanosis, clubbing or edema. Good distal pulses bilateral  SKin: Warm, no lesions or rashes   Musculoskeletal: No deformities  Neuro: no focal signs  BP 122/76  Pulse 66  Ht 5\' 6"  (1.676 m)  Wt 174 lb (78.926 kg)  BMI 28.08 kg/m2   ZOX:WRUEAV sinus rhythm,, Normal EKG

## 2011-07-13 NOTE — Assessment & Plan Note (Signed)
Followed by Dr. Sherene Sires. Patient to have spinal anesthesia.

## 2011-07-13 NOTE — Telephone Encounter (Signed)
[place], [place] with Dr Charlann Boxer called to discuss pt's surgery.  Pt is having a TOTAL HIP ARTHROPLASTY on 07/21/11. Pt had a serious GI Bleed d/t ulcer in 2011. Also hx of Fundoplication in 2009 and diverticulosis. Matt wants your input on anticoagulation post arthroplasty d/t GI hx, please. 1) Xarelto for 2 weeks 2) ASA for 4 weeks 3) Lovenox for 2 weeks. Please advise thanks.

## 2011-07-13 NOTE — Patient Instructions (Addendum)
Your physician has requested that you have a lexiscan myoview. For further information please visit [email or web address] Please follow instruction sheet, as given.  Your physician recommends that you continue on your current medications as directed. Please refer to the Current Medication list given to you today.  Your physician recommends that you schedule a follow-up appointment in: 4 MONTHS with Dr Antoine Poche

## 2011-07-13 NOTE — Telephone Encounter (Signed)
He needs to remain on Protonix indefinitely. Anticoagulation protocol per his cardiology her primary care physicians. His [place] that he does not have any GI bleeding at this time.

## 2011-07-13 NOTE — Assessment & Plan Note (Signed)
Patient has history of coronary artery disease status post drug-eluting stent to the LAD in 2008 with residual high grade PDA lesion which was a small vessel. Followup One year later showed patent stent. Normal LV function. Patient had 3 episodes of chest pressure in the past 2 months that occurred at rest. He said it was similar to when he had his angina in the past. It is difficult for him to discern between angina and his hiatal hernia. For this reason we will schedule him for a Lexiscan before clearing him for surgery.

## 2011-07-13 NOTE — Assessment & Plan Note (Signed)
Followed by Dr. Moore. 

## 2011-07-13 NOTE — Assessment & Plan Note (Signed)
Patient has history of coronary artery disease status post drug-eluting stent to the LAD in 2008 with residual high grade PDA lesion which was a small vessel. Followup One year later showed patent stent. Normal LV function. Patient had 3 episodes of chest pressure in the past 2 months that occurred at rest. He said it was similar to when he had his angina in the past. It is difficult for him to discern between angina and his hiatal hernia. For this reason we will schedule him for a Lexiscan before clearing him for surgery. 

## 2011-07-14 ENCOUNTER — Ambulatory Visit (HOSPITAL_COMMUNITY): Payer: Medicare Other | Attending: Cardiology | Admitting: Radiology

## 2011-07-14 ENCOUNTER — Other Ambulatory Visit: Payer: Self-pay | Admitting: Endocrinology

## 2011-07-14 VITALS — BP 126/78 | HR 63 | Ht 66.0 in | Wt 170.0 lb

## 2011-07-14 DIAGNOSIS — Z01810 Encounter for preprocedural cardiovascular examination: Secondary | ICD-10-CM | POA: Insufficient documentation

## 2011-07-14 DIAGNOSIS — R0989 Other specified symptoms and signs involving the circulatory and respiratory systems: Secondary | ICD-10-CM | POA: Insufficient documentation

## 2011-07-14 DIAGNOSIS — R079 Chest pain, unspecified: Secondary | ICD-10-CM

## 2011-07-14 DIAGNOSIS — Z8249 Family history of ischemic heart disease and other diseases of the circulatory system: Secondary | ICD-10-CM | POA: Insufficient documentation

## 2011-07-14 DIAGNOSIS — R0789 Other chest pain: Secondary | ICD-10-CM | POA: Insufficient documentation

## 2011-07-14 DIAGNOSIS — E785 Hyperlipidemia, unspecified: Secondary | ICD-10-CM | POA: Insufficient documentation

## 2011-07-14 DIAGNOSIS — R0602 Shortness of breath: Secondary | ICD-10-CM

## 2011-07-14 DIAGNOSIS — I2510 Atherosclerotic heart disease of native coronary artery without angina pectoris: Secondary | ICD-10-CM

## 2011-07-14 DIAGNOSIS — R0609 Other forms of dyspnea: Secondary | ICD-10-CM | POA: Insufficient documentation

## 2011-07-14 MED ORDER — TECHNETIUM TC 99M TETROFOSMIN IV KIT
32.8000 | PACK | Freq: Once | INTRAVENOUS | Status: AC | PRN
  Administered 2011-07-14: 32.8 via INTRAVENOUS

## 2011-07-14 MED ORDER — TECHNETIUM TC 99M TETROFOSMIN IV KIT
10.8000 | PACK | Freq: Once | INTRAVENOUS | Status: AC | PRN
  Administered 2011-07-14: 11 via INTRAVENOUS

## 2011-07-14 MED ORDER — REGADENOSON 0.4 MG/5ML IV SOLN
0.4000 mg | Freq: Once | INTRAVENOUS | Status: AC
  Administered 2011-07-14: 0.4 mg via INTRAVENOUS

## 2011-07-14 NOTE — Progress Notes (Signed)
[facility] SITE 3 NUCLEAR MED [address] [place] [address] [phone]  Cardiology Nuclear Med Study  [name] is a 78 y.o. male     MRN : [id number]     DOB: [birth year]  Procedure Date: 07/14/2011  Nuclear Med Background Indication for Stress Test:  Evaluation for Ischemia and Pending Clearance for (L) THR by Dr. Durene Romans on 07/21/11 History:  1/08 MWU:XLKGMW, EF=67%; 3/08 Stent-LAD with residual; '09 Echo:EF=60% Cardiac Risk Factors: Family History - CAD and Lipids  Symptoms:  Chest Pressure.  (last date of chest discomfort was about 2-3 weeks ago) and DOE   Nuclear Pre-Procedure Caffeine/Decaff Intake:  None NPO After: 630pm   Lungs:  Minimal rhonchi, no wheezing. O2 Sat: 98% on room air. IV 0.9% NS with Angio Cath:  20g  IV Site: R Antecubital  IV Started by:  Bonnita Levan, RN  Chest Size (in):  42 Cup Size: n/a  Height: 5\' 6"  (1.676 m)  Weight:  170 lb (77.111 kg)  BMI:  Body mass index is 27.44 kg/(m^2). Tech Comments:  N/A    Nuclear Med Study 1 or 2 day study: 1 day  Stress Test Type:  Lexiscan  Reading MD: Marca Ancona, MD  Order Authorizing Provider:  Rollene Rotunda, MD  Resting Radionuclide: Technetium 29m Tetrofosmin  Resting Radionuclide Dose: 10.8 mCi   Stress Radionuclide:  Technetium 57m Tetrofosmin  Stress Radionuclide Dose: 32.8 mCi           Stress Protocol Rest HR: 63 Stress HR: 92  Rest BP: 126/78 Stress BP: 127/92  Exercise Time (min): n/a METS: n/a   Predicted Max HR: 144 bpm % Max HR: 63.89 bpm Rate Pressure Product: 10272   Dose of Adenosine (mg):  n/a Dose of Lexiscan: 0.4 mg  Dose of Atropine (mg): n/a Dose of Dobutamine: n/a mcg/kg/min (at max HR)  Stress Test Technologist: Smiley Houseman, CMA-N  Nuclear Technologist:  Domenic Polite, CNMT     Rest Procedure:  Myocardial perfusion imaging was performed at rest 45 minutes following the intravenous administration of Technetium 91m Tetrofosmin.  Rest  ECG: Small inferior Q-wave, occasional PAC.  Stress Procedure:  The patient received IV Lexiscan 0.4 mg over 15-seconds.  Technetium 65m Tetrofosmin injected at 30-seconds.  There were no significant changes with Lexiscan bolus.  There were nonspecific T-wave changes late in recovery.  Quantitative spect images were obtained after a 45 minute delay.  Stress ECG: No significant change from baseline ECG  QPS Raw Data Images:  Normal; no motion artifact; normal heart/lung ratio. Stress Images:  Normal homogeneous uptake in all areas of the myocardium. Rest Images:  Normal homogeneous uptake in all areas of the myocardium. Subtraction (SDS):  There is no evidence of scar or ischemia. Transient Ischemic Dilatation (Normal <1.22):  0.98 Lung/Heart Ratio (Normal <0.45):  0.25  Quantitative Gated Spect Images QGS EDV:  69 ml QGS ESV:  19 ml  Impression Exercise Capacity:  Lexiscan with no exercise. BP Response:  Normal blood pressure response. Clinical Symptoms:  Lightheaded. ECG Impression:  No significant ST segment change suggestive of ischemia. Comparison with Prior Nuclear Study: No significant change from previous study  Overall Impression:  Normal stress nuclear study.  LV Ejection Fraction: 72%.  LV Wall Motion:  NL LV Function; NL Wall Motion  [employer]

## 2011-07-15 NOTE — H&P (Signed)
[name], NIN               ACCOUNT NO.:  [id number]  MEDICAL RECORD NO.:  [id number]  LOCATION:  [id number]                     FACILITY:  [facility]  PHYSICIAN:  Madlyn Frankel. Charlann Boxer, M.D.  DATE OF BIRTH:  1932-04-29  DATE OF ADMISSION:  07/14/2011 DATE OF DISCHARGE:                             HISTORY & PHYSICAL   ADMITTING DIAGNOSIS:  End-stage osteoarthritis, left hip.  HISTORY OF PRESENT ILLNESS:  This is a 78 year old gentleman with a history of end-stage osteoarthritis of the left hip, who is now scheduled for total hip arthroplasty by anterior approach to the left hip.  Surgery risks, benefits, and aftercare were discussed in detail with the patient.  Questions invited and answered.  Note that his medical doctor is Dr. Rudi Heap, his cardiologist is Dr. Antoine Poche, his pulmonologist is Dr. Sherene Sires, and his GI doctor is Dr. Jarold Motto.  His past medical history is complicated by a stent placement in the heart with history of MI; also with COPD and chronic breathing difficulties; and history of GI bleed, requiring transfusions.  We will check with Dr. Jarold Motto to see which DVT prophylaxis would be most appropriate with his history of GI bleed.  He is unsure whether he will be able to go home after surgery as his wife is disabled.  He will probably wind up going for a short stay at a nursing facility.  He is not given his home medications today.  He is not a candidate for tranexamic acid or dexamethasone, and will not receive either at surgery.  PAST MEDICAL HISTORY:  Drug allergy to PENICILLIN with dizziness, lightheadedness, and syncope; and OXYCODONE with nausea and vomiting.  CURRENT MEDICATIONS: 1. Baby aspirin 81 mg daily. 2. Bromocriptine 2.5 mg q.p.m. 3. Doxycycline 100 mg p.r.n. pulmonary infection. 4. Iron 65 mg q.a.m. 5. Levothyroxine 50 mg q.a.m. 6. Mucinex 600 mg b.i.d. 7. Multivitamin daily. 8. Pantoprazole 40 mg q.a.m. 9. Nitroglycerin 0.4 mg  p.r.n. 10.Hyoscyamine p.r.n. 11.Pravastatin 20 mg q.p.m. 12.Singulair 10 mg p.r.n. 13.Symbicort 160/4.5 two puffs q.a.m. and p.m. 14.Vitamin D 3000 units q.a.m. and p.m.  PREVIOUS SURGERIES:  Include reconstructive foot and ankle surgery, abdominal hernia repair, hiatal hernia repair, cataracts both eyes, and surgery for GI bleed.  SERIOUS MEDICAL ILLNESSES:  Include vertigo, anxiety, depression, COPD, asthma, coronary artery disease with heart stents, hiatal hernia with previous surgery, reflux disease and Barrett esophagitis, BPH, and hypothyroidism.  FAMILY HISTORY:  Positive for congestive failure, phlebitis, aneurysms, and heart disease.  SOCIAL HISTORY:  The patient is married.  He is retired.  He does not smoke and does not drink, and again is probably going to go to a nursing facility postoperatively for a short stay.  REVIEW OF SYSTEMS:  CENTRAL NERVOUS SYSTEM:  Positive for fatigue and minor memory loss.  PULMONARY:  Positive for exertional shortness of breath, cough, and wheezing with COPD.  CARDIOVASCULAR:  Positive for history of coronary artery disease with stents.  GI:  Positive for constipation and heartburn.  GU:  Positive for BPH and positive nocturia.  MUSCULOSKELETAL:  Positive as in HPI.  PHYSICAL EXAMINATION:  GENERAL:  This is a well-developed, well- nourished gentleman, in no acute distress. VITAL SIGNS:  Blood pressure 135/84,  pulse 74 and regular, respirations 16. HEENT:  Head normocephalic.  Nose patent.  Ears patent.  Pupils equal, round, and reactive to light.  Throat without injection. NECK:  Supple without adenopathy.  Carotids 2+ without bruits. CHEST:  Clear to auscultation.  No rales or rhonchi.  Respirations 16. HEART:  Regular rate and rhythm at 74 beats per minute without murmur. ABDOMEN:  Soft with active bowel sounds.  No masses or organomegaly. NEUROLOGIC:  The patient alert and oriented to time, place, and person. Cranial nerves II  through XII grossly intact. EXTREMITIES:  Shows the left hip with a 5 degree flexion contracture, further flexion at 90 degrees, external rotation of 25 degrees, and internal rotation to 0; all with pain.  Sensation and circulation are intact.  ASSESSMENT:  End-stage osteoarthritis, left hip.  PLAN:  Total hip arthroplasty by anterior approach to left hip pending cardiology and pulmonology clearances.     Jaquelyn Bitter. Tamikia Chowning, P.A.   ______________________________ Madlyn Frankel Charlann Boxer, M.D.    SJC/MEDQ  D:  07/13/2011  T:  07/15/2011  Job:  [id number]

## 2011-07-18 ENCOUNTER — Encounter (HOSPITAL_COMMUNITY)
Admission: RE | Admit: 2011-07-18 | Discharge: 2011-07-18 | Disposition: A | Discharge status: Home / Self Care | Payer: Medicare Other | Source: Ambulatory Visit | Attending: Orthopedic Surgery | Admitting: Orthopedic Surgery

## 2011-07-18 ENCOUNTER — Encounter (HOSPITAL_COMMUNITY): Payer: Self-pay

## 2011-07-18 ENCOUNTER — Telehealth: Payer: Self-pay | Admitting: Cardiology

## 2011-07-18 HISTORY — DX: Hypothyroidism, unspecified: E03.9

## 2011-07-18 HISTORY — DX: Anemia, unspecified: D64.9

## 2011-07-18 HISTORY — DX: Unspecified osteoarthritis, unspecified site: M19.90

## 2011-07-18 HISTORY — DX: Gastro-esophageal reflux disease without esophagitis: K21.9

## 2011-07-18 HISTORY — DX: Other complications of anesthesia, initial encounter: T88.59XA

## 2011-07-18 HISTORY — DX: Chronic obstructive pulmonary disease, unspecified: J44.9

## 2011-07-18 HISTORY — DX: Adverse effect of unspecified anesthetic, initial encounter: T41.45XA

## 2011-07-18 LAB — DIFFERENTIAL
Basophils Absolute: 0.1 10*3/uL (ref 0.0–0.1)
Basophils Relative: 1 % (ref 0–1)
Eosinophils Absolute: 0.1 10*3/uL (ref 0.0–0.7)
Eosinophils Relative: 2 % (ref 0–5)
Lymphocytes Relative: 21 % (ref 12–46)
Lymphs Abs: 1.4 10*3/uL (ref 0.7–4.0)
Monocytes Absolute: 0.9 10*3/uL (ref 0.1–1.0)
Monocytes Relative: 14 % — ABNORMAL HIGH (ref 3–12)
Neutro Abs: 4.3 10*3/uL (ref 1.7–7.7)
Neutrophils Relative %: 63 % (ref 43–77)

## 2011-07-18 LAB — CBC
HCT: 39.2 % (ref 39.0–52.0)
Hemoglobin: 13.1 g/dL (ref 13.0–17.0)
MCH: 31.1 pg (ref 26.0–34.0)
MCHC: 33.4 g/dL (ref 30.0–36.0)
MCV: 93.1 fL (ref 78.0–100.0)
Platelets: 236 10*3/uL (ref 150–400)
RBC: 4.21 MIL/uL — ABNORMAL LOW (ref 4.22–5.81)
RDW: 14 % (ref 11.5–15.5)
WBC: 6.8 10*3/uL (ref 4.0–10.5)

## 2011-07-18 LAB — PROTIME-INR
INR: 0.97 (ref 0.00–1.49)
Prothrombin Time: 13.1 seconds (ref 11.6–15.2)

## 2011-07-18 LAB — URINALYSIS, ROUTINE W REFLEX MICROSCOPIC
Bilirubin Urine: NEGATIVE
Glucose, UA: NEGATIVE mg/dL
Hgb urine dipstick: NEGATIVE
Ketones, ur: NEGATIVE mg/dL
Leukocytes, UA: NEGATIVE
Nitrite: NEGATIVE
Protein, ur: NEGATIVE mg/dL
Specific Gravity, Urine: 1.014 (ref 1.005–1.030)
Urobilinogen, UA: 0.2 mg/dL (ref 0.0–1.0)
pH: 6 (ref 5.0–8.0)

## 2011-07-18 LAB — COMPREHENSIVE METABOLIC PANEL
ALT: 13 U/L (ref 0–53)
AST: 20 U/L (ref 0–37)
Albumin: 3.5 g/dL (ref 3.5–5.2)
Alkaline Phosphatase: 88 U/L (ref 39–117)
BUN: 12 mg/dL (ref 6–23)
CO2: 27 mEq/L (ref 19–32)
Calcium: 9.2 mg/dL (ref 8.4–10.5)
Chloride: 106 mEq/L (ref 96–112)
Creatinine, Ser: 0.93 mg/dL (ref 0.50–1.35)
GFR calc Af Amer: >90 mL/min (ref >90)
GFR calc non Af Amer: 80 mL/min — ABNORMAL LOW (ref >90)
Glucose, Bld: 82 mg/dL (ref 70–99)
Potassium: 4 mEq/L (ref 3.5–5.1)
Sodium: 140 mEq/L (ref 135–145)
Total Bilirubin: 0.3 mg/dL (ref 0.3–1.2)
Total Protein: 6.7 g/dL (ref 6.0–8.3)

## 2011-07-18 LAB — SURGICAL PCR SCREEN
MRSA, PCR: NEGATIVE
Staphylococcus aureus: NEGATIVE

## 2011-07-18 LAB — APTT: aPTT: 37 seconds (ref 24–37)

## 2011-07-18 NOTE — Telephone Encounter (Signed)
Pt has been cleared s/p normal nuc study.  Results and clearance forwarded to Dr Charlann Boxer and Dr Christell Constant.

## 2011-07-18 NOTE — Telephone Encounter (Signed)
New problem:  [er Clydie Braun calling, checking on status of cardiac clearance surgery on 5/23- hip replacement.

## 2011-07-18 NOTE — Patient Instructions (Addendum)
20 [name]  07/18/2011   Your procedure is scheduled on:  07-21-2011  Report to [facility] at 0530  AM.  Call this number if you have problems the morning of surgery: [phone]   Remember:   Do not eat food or drink liquids:After Midnight.  .  Take these medicines the morning of surgery with A SIP OF WATER: albuterol inhaler if needed and bring inhaler, symbicort, levothryoxine, pantaprazole  Do not wear jewelry or make up.  Do not wear lotions, powders, or perfumes.Do not wear deodorant.    Do not bring valuables to the hospital.  Contacts, dentures or bridgework may not be worn into surgery.  Leave suitcase in the car. After surgery it may be brought to your room.  For patients admitted to the hospital, checkout time is 11:00 AM the day of discharge.     Special Instructions: CHG Shower Use Special Wash: 1/2 bottle night before surgery and 1/2 bottle morning of surgery.neck down avoid private area   Please read over the following fact sheets that you were given: MRSA Information, blood fact sheet , in centive spirimeter fact sheet, singulair if needed  Cain S[facility] pre op nurse phone number [phone], call if needed

## 2011-07-18 NOTE — Telephone Encounter (Signed)
Pt had recent normal nuclear study.  Surgical clearance paperwork given to Dr Antoine Poche to sign off on.

## 2011-07-18 NOTE — Pre-Procedure Instructions (Signed)
lov note dr wert  Pulmonary clearance 07-12-2011 epic ekg 07-13-2011 epic Stress test 07-14-2011 epic Chest ct 07-22-2010 epic

## 2011-07-19 ENCOUNTER — Ambulatory Visit: Payer: Medicare Other | Admitting: Internal Medicine

## 2011-07-19 NOTE — Pre-Procedure Instructions (Signed)
Cardiac clearance note dr Antoine Poche 07-18-2011 on chart

## 2011-07-21 ENCOUNTER — Encounter (HOSPITAL_COMMUNITY)
Admission: RE | Disposition: A | Discharge status: Skilled Nursing Facility | Payer: Self-pay | Source: Ambulatory Visit | Attending: Orthopedic Surgery

## 2011-07-21 ENCOUNTER — Ambulatory Visit (HOSPITAL_COMMUNITY): Payer: Medicare Other

## 2011-07-21 ENCOUNTER — Ambulatory Visit (HOSPITAL_COMMUNITY): Payer: Medicare Other | Admitting: Anesthesiology

## 2011-07-21 ENCOUNTER — Encounter (HOSPITAL_COMMUNITY): Payer: Self-pay | Admitting: *Deleted

## 2011-07-21 ENCOUNTER — Encounter (HOSPITAL_COMMUNITY): Payer: Self-pay | Admitting: Anesthesiology

## 2011-07-21 ENCOUNTER — Inpatient Hospital Stay (HOSPITAL_COMMUNITY)
Admission: RE | Admit: 2011-07-21 | Discharge: 2011-07-26 | DRG: 470 | Disposition: A | Discharge status: Skilled Nursing Facility | Payer: Medicare Other | Source: Ambulatory Visit | Attending: Orthopedic Surgery | Admitting: Orthopedic Surgery

## 2011-07-21 DIAGNOSIS — F3289 Other specified depressive episodes: Secondary | ICD-10-CM | POA: Diagnosis present

## 2011-07-21 DIAGNOSIS — R339 Retention of urine, unspecified: Secondary | ICD-10-CM | POA: Diagnosis not present

## 2011-07-21 DIAGNOSIS — M169 Osteoarthritis of hip, unspecified: Principal | ICD-10-CM | POA: Diagnosis present

## 2011-07-21 DIAGNOSIS — F329 Major depressive disorder, single episode, unspecified: Secondary | ICD-10-CM | POA: Diagnosis present

## 2011-07-21 DIAGNOSIS — E039 Hypothyroidism, unspecified: Secondary | ICD-10-CM | POA: Diagnosis present

## 2011-07-21 DIAGNOSIS — Z96649 Presence of unspecified artificial hip joint: Secondary | ICD-10-CM

## 2011-07-21 DIAGNOSIS — F411 Generalized anxiety disorder: Secondary | ICD-10-CM | POA: Diagnosis present

## 2011-07-21 DIAGNOSIS — K219 Gastro-esophageal reflux disease without esophagitis: Secondary | ICD-10-CM | POA: Diagnosis present

## 2011-07-21 DIAGNOSIS — J4489 Other specified chronic obstructive pulmonary disease: Secondary | ICD-10-CM | POA: Diagnosis present

## 2011-07-21 DIAGNOSIS — N400 Enlarged prostate without lower urinary tract symptoms: Secondary | ICD-10-CM | POA: Diagnosis present

## 2011-07-21 DIAGNOSIS — I2510 Atherosclerotic heart disease of native coronary artery without angina pectoris: Secondary | ICD-10-CM | POA: Diagnosis present

## 2011-07-21 DIAGNOSIS — R42 Dizziness and giddiness: Secondary | ICD-10-CM | POA: Diagnosis not present

## 2011-07-21 DIAGNOSIS — J449 Chronic obstructive pulmonary disease, unspecified: Secondary | ICD-10-CM | POA: Diagnosis present

## 2011-07-21 DIAGNOSIS — M1610 Unilateral primary osteoarthritis, unspecified hip: Principal | ICD-10-CM | POA: Diagnosis present

## 2011-07-21 DIAGNOSIS — Z9861 Coronary angioplasty status: Secondary | ICD-10-CM

## 2011-07-21 DIAGNOSIS — E871 Hypo-osmolality and hyponatremia: Secondary | ICD-10-CM | POA: Diagnosis not present

## 2011-07-21 HISTORY — PX: TOTAL HIP ARTHROPLASTY: SHX124

## 2011-07-21 SURGERY — ARTHROPLASTY, HIP, TOTAL, ANTERIOR APPROACH
Anesthesia: Spinal | Site: Hip | Laterality: Left | Wound class: Clean

## 2011-07-21 MED ORDER — SODIUM CHLORIDE 0.9 % IV SOLN
100.0000 mL/h | INTRAVENOUS | Status: AC
  Administered 2011-07-21 – 2011-07-22 (×2): 100 mL/h via INTRAVENOUS
  Filled 2011-07-21 (×8): qty 1000

## 2011-07-21 MED ORDER — RIVAROXABAN 10 MG PO TABS
10.0000 mg | ORAL_TABLET | Freq: Every day | ORAL | Status: DC
  Administered 2011-07-22 – 2011-07-26 (×5): 10 mg via ORAL
  Filled 2011-07-21 (×5): qty 1

## 2011-07-21 MED ORDER — WARFARIN SODIUM 7.5 MG PO TABS
7.5000 mg | ORAL_TABLET | Freq: Once | ORAL | Status: DC
  Filled 2011-07-21: qty 1

## 2011-07-21 MED ORDER — LACTATED RINGERS IV SOLN: INTRAVENOUS | Status: DC

## 2011-07-21 MED ORDER — ZOLPIDEM TARTRATE 5 MG PO TABS: 5.0000 mg | ORAL_TABLET | Freq: Every evening | ORAL | Status: DC | PRN

## 2011-07-21 MED ORDER — HETASTARCH-ELECTROLYTES 6 % IV SOLN
INTRAVENOUS | Status: DC | PRN
  Administered 2011-07-21: 08:00:00 via INTRAVENOUS

## 2011-07-21 MED ORDER — GUAIFENESIN ER 600 MG PO TB12
600.0000 mg | ORAL_TABLET | Freq: Two times a day (BID) | ORAL | Status: DC | PRN
  Filled 2011-07-21: qty 1

## 2011-07-21 MED ORDER — MONTELUKAST SODIUM 10 MG PO TABS
10.0000 mg | ORAL_TABLET | Freq: Every day | ORAL | Status: DC | PRN
  Filled 2011-07-21: qty 1

## 2011-07-21 MED ORDER — ACETAMINOPHEN 650 MG RE SUPP: 650.0000 mg | Freq: Four times a day (QID) | RECTAL | Status: DC | PRN

## 2011-07-21 MED ORDER — BISACODYL 5 MG PO TBEC
5.0000 mg | DELAYED_RELEASE_TABLET | Freq: Every day | ORAL | Status: DC | PRN
  Administered 2011-07-25: 5 mg via ORAL
  Filled 2011-07-21: qty 1

## 2011-07-21 MED ORDER — WARFARIN - PHARMACIST DOSING INPATIENT: Freq: Every day | Status: DC

## 2011-07-21 MED ORDER — PHENOL 1.4 % MT LIQD
1.0000 | OROMUCOSAL | Status: DC | PRN
  Filled 2011-07-21: qty 177

## 2011-07-21 MED ORDER — FAMOTIDINE 20 MG PO TABS
20.0000 mg | ORAL_TABLET | Freq: Every evening | ORAL | Status: DC | PRN
  Administered 2011-07-21: 20 mg via ORAL
  Filled 2011-07-21 (×2): qty 1

## 2011-07-21 MED ORDER — BUDESONIDE-FORMOTEROL FUMARATE 160-4.5 MCG/ACT IN AERO
2.0000 | INHALATION_SPRAY | Freq: Two times a day (BID) | RESPIRATORY_TRACT | Status: DC
  Administered 2011-07-21 – 2011-07-26 (×10): 2 via RESPIRATORY_TRACT
  Filled 2011-07-21: qty 6

## 2011-07-21 MED ORDER — CLINDAMYCIN PHOSPHATE 600 MG/50ML IV SOLN
600.0000 mg | Freq: Four times a day (QID) | INTRAVENOUS | Status: AC
  Administered 2011-07-21 – 2011-07-22 (×3): 600 mg via INTRAVENOUS
  Filled 2011-07-21 (×3): qty 50

## 2011-07-21 MED ORDER — FLEET ENEMA 7-19 GM/118ML RE ENEM: 1.0000 | ENEMA | Freq: Once | RECTAL | Status: AC | PRN

## 2011-07-21 MED ORDER — 0.9 % SODIUM CHLORIDE (POUR BTL) OPTIME
TOPICAL | Status: DC | PRN
  Administered 2011-07-21: 1000 mL

## 2011-07-21 MED ORDER — FERROUS SULFATE 325 (65 FE) MG PO TABS
325.0000 mg | ORAL_TABLET | Freq: Three times a day (TID) | ORAL | Status: DC
  Administered 2011-07-21 – 2011-07-26 (×13): 325 mg via ORAL
  Filled 2011-07-21 (×17): qty 1

## 2011-07-21 MED ORDER — LEVOTHYROXINE SODIUM 50 MCG PO TABS
50.0000 ug | ORAL_TABLET | Freq: Every day | ORAL | Status: DC
  Administered 2011-07-22 – 2011-07-26 (×5): 50 ug via ORAL
  Filled 2011-07-21 (×6): qty 1

## 2011-07-21 MED ORDER — DOCUSATE SODIUM 100 MG PO CAPS
100.0000 mg | ORAL_CAPSULE | Freq: Two times a day (BID) | ORAL | Status: DC
  Administered 2011-07-21 – 2011-07-26 (×10): 100 mg via ORAL

## 2011-07-21 MED ORDER — CLINDAMYCIN PHOSPHATE 600 MG/50ML IV SOLN
600.0000 mg | INTRAVENOUS | Status: AC
  Administered 2011-07-21: 600 mg via INTRAVENOUS

## 2011-07-21 MED ORDER — PANTOPRAZOLE SODIUM 40 MG PO TBEC
40.0000 mg | DELAYED_RELEASE_TABLET | Freq: Every day | ORAL | Status: DC
  Administered 2011-07-22 – 2011-07-25 (×4): 40 mg via ORAL
  Filled 2011-07-21 (×5): qty 1

## 2011-07-21 MED ORDER — ALUM & MAG HYDROXIDE-SIMETH 200-200-20 MG/5ML PO SUSP: 30.0000 mL | ORAL | Status: DC | PRN

## 2011-07-21 MED ORDER — WARFARIN VIDEO: Freq: Once | Status: DC

## 2011-07-21 MED ORDER — PROPOFOL 10 MG/ML IV BOLUS
INTRAVENOUS | Status: DC | PRN
  Administered 2011-07-21: 20 mg via INTRAVENOUS

## 2011-07-21 MED ORDER — METHOCARBAMOL 100 MG/ML IJ SOLN
500.0000 mg | Freq: Four times a day (QID) | INTRAVENOUS | Status: DC | PRN
  Administered 2011-07-21: 500 mg via INTRAVENOUS
  Filled 2011-07-21 (×2): qty 5

## 2011-07-21 MED ORDER — METOCLOPRAMIDE HCL 5 MG/ML IJ SOLN: 5.0000 mg | Freq: Three times a day (TID) | INTRAMUSCULAR | Status: DC | PRN

## 2011-07-21 MED ORDER — ONDANSETRON HCL 4 MG/2ML IJ SOLN
4.0000 mg | Freq: Four times a day (QID) | INTRAMUSCULAR | Status: DC | PRN
  Administered 2011-07-21 – 2011-07-23 (×3): 4 mg via INTRAVENOUS
  Filled 2011-07-21 (×3): qty 2

## 2011-07-21 MED ORDER — NITROGLYCERIN 0.4 MG SL SUBL: 0.4000 mg | SUBLINGUAL_TABLET | SUBLINGUAL | Status: DC | PRN

## 2011-07-21 MED ORDER — ACETAMINOPHEN 325 MG PO TABS
650.0000 mg | ORAL_TABLET | Freq: Four times a day (QID) | ORAL | Status: DC | PRN
  Filled 2011-07-21: qty 2

## 2011-07-21 MED ORDER — LACTATED RINGERS IV SOLN
INTRAVENOUS | Status: DC | PRN
  Administered 2011-07-21 (×2): via INTRAVENOUS

## 2011-07-21 MED ORDER — PROPOFOL 10 MG/ML IV EMUL
INTRAVENOUS | Status: DC | PRN
  Administered 2011-07-21: 140 ug/kg/min via INTRAVENOUS

## 2011-07-21 MED ORDER — HYDROMORPHONE HCL PF 1 MG/ML IJ SOLN
0.5000 mg | INTRAMUSCULAR | Status: DC | PRN
  Administered 2011-07-21 (×2): 0.5 mg via INTRAVENOUS
  Filled 2011-07-21 (×3): qty 1

## 2011-07-21 MED ORDER — BUPIVACAINE IN DEXTROSE 0.75-8.25 % IT SOLN
INTRATHECAL | Status: DC | PRN
  Administered 2011-07-21: 10 mg via INTRATHECAL

## 2011-07-21 MED ORDER — MIDAZOLAM HCL 5 MG/5ML IJ SOLN
INTRAMUSCULAR | Status: DC | PRN
  Administered 2011-07-21: 0.5 mg via INTRAVENOUS

## 2011-07-21 MED ORDER — DIPHENHYDRAMINE HCL 25 MG PO CAPS
25.0000 mg | ORAL_CAPSULE | Freq: Four times a day (QID) | ORAL | Status: DC | PRN
  Filled 2011-07-21: qty 1

## 2011-07-21 MED ORDER — MENTHOL 3 MG MT LOZG
1.0000 | LOZENGE | OROMUCOSAL | Status: DC | PRN
  Filled 2011-07-21: qty 9

## 2011-07-21 MED ORDER — POLYETHYLENE GLYCOL 3350 17 G PO PACK
17.0000 g | PACK | Freq: Two times a day (BID) | ORAL | Status: DC
  Administered 2011-07-21 – 2011-07-26 (×9): 17 g via ORAL

## 2011-07-21 MED ORDER — METOCLOPRAMIDE HCL 10 MG PO TABS
5.0000 mg | ORAL_TABLET | Freq: Three times a day (TID) | ORAL | Status: DC | PRN
  Filled 2011-07-21: qty 1

## 2011-07-21 MED ORDER — HYOSCYAMINE SULFATE 0.125 MG SL SUBL
0.1250 mg | SUBLINGUAL_TABLET | SUBLINGUAL | Status: DC | PRN
  Filled 2011-07-21: qty 1

## 2011-07-21 MED ORDER — HYDROMORPHONE HCL PF 1 MG/ML IJ SOLN: 0.2500 mg | INTRAMUSCULAR | Status: DC | PRN

## 2011-07-21 MED ORDER — NON FORMULARY: 40.0000 mg | Freq: Every day | Status: DC

## 2011-07-21 MED ORDER — ONDANSETRON HCL 4 MG PO TABS
4.0000 mg | ORAL_TABLET | Freq: Four times a day (QID) | ORAL | Status: DC | PRN
  Administered 2011-07-22 – 2011-07-26 (×5): 4 mg via ORAL
  Filled 2011-07-21 (×5): qty 1

## 2011-07-21 MED ORDER — ENOXAPARIN SODIUM 40 MG/0.4ML ~~LOC~~ SOLN
40.0000 mg | SUBCUTANEOUS | Status: DC
  Filled 2011-07-21: qty 0.4

## 2011-07-21 MED ORDER — TRAMADOL HCL 50 MG PO TABS
50.0000 mg | ORAL_TABLET | Freq: Four times a day (QID) | ORAL | Status: DC
  Administered 2011-07-21: 50 mg via ORAL
  Administered 2011-07-21 – 2011-07-22 (×2): 100 mg via ORAL
  Filled 2011-07-21 (×6): qty 2
  Filled 2011-07-21: qty 1

## 2011-07-21 MED ORDER — BROMOCRIPTINE MESYLATE 2.5 MG PO TABS
2.5000 mg | ORAL_TABLET | Freq: Every day | ORAL | Status: DC
  Administered 2011-07-21 – 2011-07-25 (×5): 2.5 mg via ORAL
  Filled 2011-07-21 (×6): qty 1

## 2011-07-21 MED ORDER — ATORVASTATIN CALCIUM 10 MG PO TABS
10.0000 mg | ORAL_TABLET | Freq: Every day | ORAL | Status: DC
  Administered 2011-07-21 – 2011-07-25 (×5): 10 mg via ORAL
  Filled 2011-07-21 (×6): qty 1

## 2011-07-21 MED ORDER — METHOCARBAMOL 500 MG PO TABS
500.0000 mg | ORAL_TABLET | Freq: Four times a day (QID) | ORAL | Status: DC | PRN
  Administered 2011-07-22 – 2011-07-26 (×6): 500 mg via ORAL
  Filled 2011-07-21 (×7): qty 1

## 2011-07-21 MED ORDER — ALBUTEROL SULFATE HFA 108 (90 BASE) MCG/ACT IN AERS
2.0000 | INHALATION_SPRAY | Freq: Four times a day (QID) | RESPIRATORY_TRACT | Status: DC | PRN
  Filled 2011-07-21: qty 6.7

## 2011-07-21 MED ORDER — COUMADIN BOOK
Freq: Once | Status: AC
  Administered 2011-07-21: 19:00:00
  Filled 2011-07-21: qty 1

## 2011-07-21 MED ORDER — EPHEDRINE SULFATE 50 MG/ML IJ SOLN
INTRAMUSCULAR | Status: DC | PRN
  Administered 2011-07-21 (×2): 5 mg via INTRAVENOUS

## 2011-07-21 MED ORDER — WARFARIN SODIUM 7.5 MG PO TABS: 7.5000 mg | ORAL_TABLET | Freq: Once | ORAL | Status: DC

## 2011-07-21 SURGICAL SUPPLY — 40 items
BAG ZIPLOCK 12X15 (MISCELLANEOUS) ×4 IMPLANT
BLADE SAW SGTL 18X1.27X75 (BLADE) ×2 IMPLANT
CELLS DAT CNTRL 66122 CELL SVR (MISCELLANEOUS) ×1 IMPLANT
CLOTH BEACON ORANGE TIMEOUT ST (SAFETY) ×2 IMPLANT
DERMABOND ADVANCED (GAUZE/BANDAGES/DRESSINGS) ×1
DERMABOND ADVANCED .7 DNX12 (GAUZE/BANDAGES/DRESSINGS) ×1 IMPLANT
DRAPE C-ARM 42X72 X-RAY (DRAPES) ×2 IMPLANT
DRAPE STERI IOBAN 125X83 (DRAPES) ×2 IMPLANT
DRAPE U-SHAPE 47X51 STRL (DRAPES) ×6 IMPLANT
DRSG AQUACEL AG ADV 3.5X10 (GAUZE/BANDAGES/DRESSINGS) ×2 IMPLANT
DRSG TEGADERM 4X4.75 (GAUZE/BANDAGES/DRESSINGS) ×2 IMPLANT
DURAPREP 26ML APPLICATOR (WOUND CARE) ×2 IMPLANT
ELECT BLADE TIP CTD 4 INCH (ELECTRODE) ×2 IMPLANT
ELECT REM PT RETURN 9FT ADLT (ELECTROSURGICAL) ×2
ELECTRODE REM PT RTRN 9FT ADLT (ELECTROSURGICAL) ×1 IMPLANT
EVACUATOR 1/8 PVC DRAIN (DRAIN) ×2 IMPLANT
FACESHIELD LNG OPTICON STERILE (SAFETY) ×8 IMPLANT
GAUZE SPONGE 2X2 8PLY STRL LF (GAUZE/BANDAGES/DRESSINGS) ×1 IMPLANT
GLOVE BIOGEL PI IND STRL 7.5 (GLOVE) ×1 IMPLANT
GLOVE BIOGEL PI IND STRL 8 (GLOVE) ×1 IMPLANT
GLOVE BIOGEL PI INDICATOR 7.5 (GLOVE) ×1
GLOVE BIOGEL PI INDICATOR 8 (GLOVE) ×1
GLOVE ECLIPSE 8.0 STRL XLNG CF (GLOVE) ×2 IMPLANT
GLOVE ORTHO TXT STRL SZ7.5 (GLOVE) ×4 IMPLANT
GOWN BRE IMP PREV XXLGXLNG (GOWN DISPOSABLE) ×4 IMPLANT
GOWN STRL NON-REIN LRG LVL3 (GOWN DISPOSABLE) ×2 IMPLANT
KIT BASIN OR (CUSTOM PROCEDURE TRAY) ×2 IMPLANT
PACK TOTAL JOINT (CUSTOM PROCEDURE TRAY) ×2 IMPLANT
PADDING CAST COTTON 6X4 STRL (CAST SUPPLIES) ×2 IMPLANT
RTRCTR WOUND ALEXIS 18CM MED (MISCELLANEOUS) ×2
SPONGE GAUZE 2X2 STER 10/PKG (GAUZE/BANDAGES/DRESSINGS) ×1
SUCTION FRAZIER 12FR DISP (SUCTIONS) ×2 IMPLANT
SUT MNCRL AB 4-0 PS2 18 (SUTURE) ×2 IMPLANT
SUT V-LOC BARB 180 2/0GR9 GS23 (SUTURE) ×2
SUT VIC AB 1 CT1 36 (SUTURE) ×8 IMPLANT
SUT VIC AB 2-0 CT1 27 (SUTURE) ×2
SUT VIC AB 2-0 CT1 TAPERPNT 27 (SUTURE) ×2 IMPLANT
SUTURE V-LC BRB 180 2/0GR9GS23 (SUTURE) ×1 IMPLANT
TOWEL OR 17X26 10 PK STRL BLUE (TOWEL DISPOSABLE) ×4 IMPLANT
TRAY FOLEY CATH 14FRSI W/METER (CATHETERS) ×2 IMPLANT

## 2011-07-21 NOTE — Plan of Care (Signed)
Problem: Consults Goal: Diagnosis- Total Joint Replacement Outcome: Completed/Met Date Met:  07/21/11 Primary Total Hip

## 2011-07-21 NOTE — Progress Notes (Addendum)
ANTICOAGULATION CONSULT NOTE - Initial Consult  Pharmacy Consult for: Coumadin Indication: VTE prophylaxis s/p L THA  Allergies  Allergen Reactions  . Lortab (Hydrocodone-Acetaminophen) Nausea And Vomiting  . Penicillins Other (See Comments)    Light headed  . Zetia (Ezetimibe)     Muscle weakness  . Zocor (Simvastatin) Other (See Comments)    Nausea, muscle weakness    Patient Measurements:   Vital Signs: Temp: 97.5 F (36.4 C) (05/23 1121) BP: 109/71 mmHg (05/23 1121) Pulse Rate: 67  (05/23 1121)  Labs: No results found for this basename: HGB:2,HCT:3,PLT:3,APTT:3,LABPROT:3,INR:3,HEPARINUNFRC:3,CREATININE:3,CKTOTAL:3,CKMB:3,TROPONINI:3 in the last 72 hours  The CrCl is unknown because both a height and weight (above a minimum accepted value) are required for this calculation.   Medical History: Past Medical History  Diagnosis Date  . Emphysema   . CAD (coronary artery disease)     left main had  a 20% stenosis.  Left anterior descending had 40-50% proximal   stenosis.  Left circumflex had an 80% proximal stenosis.  The  first obtuse marginal artery had a 60% mid stenosis.  The  right  coronary artery  had  a 50-60% proximal stenosis with a 60-70%  mid stenosis  The patient was managed medically.   Marland Kitchen COPD (chronic obstructive pulmonary disease)   . Hypothyroidism   . Anemia   . Blood transfusion june 2011, 5 units given    july 2011 some units given  . Enlarged prostate     elevated psa recently  . GERD (gastroesophageal reflux disease)   . Arthritis   . Complication of anesthesia     likes spinal due to copd  . Dizziness     occasional    Medications:  Scheduled:    . atorvastatin  10 mg Oral QHS  . bromocriptine  2.5 mg Oral QHS  . budesonide-formoterol  2 puff Inhalation BID  . clindamycin (CLEOCIN) IV  600 mg Intravenous 60 min Pre-Op  . clindamycin (CLEOCIN) IV  600 mg Intravenous Q6H  . docusate sodium  100 mg Oral BID  . enoxaparin  40 mg  Subcutaneous Q24H  . ferrous sulfate  325 mg Oral TID PC  . levothyroxine  50 mcg Oral QAC breakfast  . pantoprazole  40 mg Oral Q1200  . polyethylene glycol  17 g Oral BID  . traMADol  50-100 mg Oral Q6H  . DISCONTD: NON FORMULARY 40 mg  40 mg Oral Daily   Infusions:    . lactated ringers    . sodium chloride 0.9 % 1,000 mL with potassium chloride 10 mEq infusion     PRN: acetaminophen, acetaminophen, albuterol, alum & mag hydroxide-simeth, bisacodyl, diphenhydrAMINE, famotidine, guaiFENesin, HYDROmorphone (DILAUDID) injection, hyoscyamine, menthol-cetylpyridinium, methocarbamol (ROBAXIN) IV, methocarbamol, metoCLOPramide (REGLAN) injection, metoCLOPramide, montelukast, nitroGLYCERIN, ondansetron (ZOFRAN) IV, ondansetron, phenol, sodium phosphate, zolpidem, DISCONTD: 0.9 % irrigation (POUR BTL) DISCONTD:  HYDROmorphone (DILAUDID) injection  Assessment:  78 yo M starting coumadin for vte prophylaxis s/p L THA  Baseline INR/CBC WNL  Also on lovenox 40 mg daily until INR > /= 1.8  Goal of Therapy:  INR 2-3 Monitor platelets by anticoagulation protocol: Yes   Plan:  1.) Coumadin 7.5 mg po x 1 at 2000 2.) Dailly PT/INR 3.) Coumadin book/video/education 4.) F/U D/c lovenox when INR therapeutic   Chesley Veasey, Loma Messing PharmD 12:54 PM 07/21/2011

## 2011-07-21 NOTE — H&P (View-Only) (Signed)
NAME:  [name], [name]               ACCOUNT NO.:  [id number]  MEDICAL RECORD NO.:  [id number]  LOCATION:  [id number]                     FACILITY:  [facility]  PHYSICIAN:  Matthew D. Olin, M.D.  DATE OF BIRTH:  12/26/1932  DATE OF ADMISSION:  07/14/2011 DATE OF DISCHARGE:                             HISTORY & PHYSICAL   ADMITTING DIAGNOSIS:  End-stage osteoarthritis, left hip.  HISTORY OF PRESENT ILLNESS:  This is a 78-year-old gentleman with a history of end-stage osteoarthritis of the left hip, who is now scheduled for total hip arthroplasty by anterior approach to the left hip.  Surgery risks, benefits, and aftercare were discussed in detail with the patient.  Questions invited and answered.  Note that his medical doctor is Dr. Donald Moore, his cardiologist is Dr. Hochrein, his pulmonologist is Dr. Wert, and his GI doctor is Dr. Patterson.  His past medical history is complicated by a stent placement in the heart with history of MI; also with COPD and chronic breathing difficulties; and history of GI bleed, requiring transfusions.  We will check with Dr. Patterson to see which DVT prophylaxis would be most appropriate with his history of GI bleed.  He is unsure whether he will be able to go home after surgery as his wife is disabled.  He will probably wind up going for a short stay at a nursing facility.  He is not given his home medications today.  He is not a candidate for tranexamic acid or dexamethasone, and will not receive either at surgery.  PAST MEDICAL HISTORY:  Drug allergy to PENICILLIN with dizziness, lightheadedness, and syncope; and OXYCODONE with nausea and vomiting.  CURRENT MEDICATIONS: 1. Baby aspirin 81 mg daily. 2. Bromocriptine 2.5 mg q.p.m. 3. Doxycycline 100 mg p.r.n. pulmonary infection. 4. Iron 65 mg q.a.m. 5. Levothyroxine 50 mg q.a.m. 6. Mucinex 600 mg b.i.d. 7. Multivitamin daily. 8. Pantoprazole 40 mg q.a.m. 9. Nitroglycerin 0.4 mg  p.r.n. 10.Hyoscyamine p.r.n. 11.Pravastatin 20 mg q.p.m. 12.Singulair 10 mg p.r.n. 13.Symbicort 160/4.5 two puffs q.a.m. and p.m. 14.Vitamin D 3000 units q.a.m. and p.m.  PREVIOUS SURGERIES:  Include reconstructive foot and ankle surgery, abdominal hernia repair, hiatal hernia repair, cataracts both eyes, and surgery for GI bleed.  SERIOUS MEDICAL ILLNESSES:  Include vertigo, anxiety, depression, COPD, asthma, coronary artery disease with heart stents, hiatal hernia with previous surgery, reflux disease and Barrett esophagitis, BPH, and hypothyroidism.  FAMILY HISTORY:  Positive for congestive failure, phlebitis, aneurysms, and heart disease.  SOCIAL HISTORY:  The patient is married.  He is retired.  He does not smoke and does not drink, and again is probably going to go to a nursing facility postoperatively for a short stay.  REVIEW OF SYSTEMS:  CENTRAL NERVOUS SYSTEM:  Positive for fatigue and minor memory loss.  PULMONARY:  Positive for exertional shortness of breath, cough, and wheezing with COPD.  CARDIOVASCULAR:  Positive for history of coronary artery disease with stents.  GI:  Positive for constipation and heartburn.  GU:  Positive for BPH and positive nocturia.  MUSCULOSKELETAL:  Positive as in HPI.  PHYSICAL EXAMINATION:  GENERAL:  This is a well-developed, well- nourished gentleman, in no acute distress. VITAL SIGNS:  Blood pressure 135/84,   pulse 74 and regular, respirations 16. HEENT:  Head normocephalic.  Nose patent.  Ears patent.  Pupils equal, round, and reactive to light.  Throat without injection. NECK:  Supple without adenopathy.  Carotids 2+ without bruits. CHEST:  Clear to auscultation.  No rales or rhonchi.  Respirations 16. HEART:  Regular rate and rhythm at 74 beats per minute without murmur. ABDOMEN:  Soft with active bowel sounds.  No masses or organomegaly. NEUROLOGIC:  The patient alert and oriented to time, place, and person. Cranial nerves II  through XII grossly intact. EXTREMITIES:  Shows the left hip with a 5 degree flexion contracture, further flexion at 90 degrees, external rotation of 25 degrees, and internal rotation to 0; all with pain.  Sensation and circulation are intact.  ASSESSMENT:  End-stage osteoarthritis, left hip.  PLAN:  Total hip arthroplasty by anterior approach to left hip pending cardiology and pulmonology clearances.     Saleemah Mollenhauer J. Dawn Convery, P.A.   ______________________________ Matthew D. Olin, M.D.    SJC/MEDQ  D:  07/13/2011  T:  07/15/2011  Job:  [id number] 

## 2011-07-21 NOTE — Anesthesia Postprocedure Evaluation (Signed)
  Anesthesia Post-op Note  Patient: [name]  Procedure(s) Performed: Procedure(s) (LRB): TOTAL HIP ARTHROPLASTY ANTERIOR APPROACH (Left)  Patient Location: PACU  Anesthesia Type: Spinal  Level of Consciousness: oriented and sedated  Airway and Oxygen Therapy: Patient Spontanous Breathing and Patient connected to nasal cannula oxygen  Post-op Pain: none  Post-op Assessment: Post-op Vital signs reviewed, Patient's Cardiovascular Status Stable, Respiratory Function Stable and Patent Airway  Post-op Vital Signs: stable  Complications: No apparent anesthesia complications

## 2011-07-21 NOTE — Anesthesia Preprocedure Evaluation (Signed)
Anesthesia Evaluation  Patient identified by MRN, date of birth, ID band Patient awake    Reviewed: Allergy & Precautions, H&P , NPO status , Patient's Chart, lab work & pertinent test results, reviewed documented beta blocker date and time   Airway Mallampati: II TM Distance: >3 FB Neck ROM: Full    Dental  (+) Teeth Intact and Dental Advisory Given   Pulmonary COPD COPD inhaler,  breath sounds clear to auscultation        Cardiovascular + CAD Rhythm:Regular Rate:Normal  CAD, s/p PTCA w/ stent 2008 Currently asymptomatic Given CV clearance   Neuro/Psych negative neurological ROS  negative psych ROS   GI/Hepatic negative GI ROS, Neg liver ROS, GERD-  Medicated,  Endo/Other  Hypothyroidism Thyroid replacement  Renal/GU negative Renal ROS  negative genitourinary   Musculoskeletal   Abdominal   Peds negative pediatric ROS (+)  Hematology negative hematology ROS (+)   Anesthesia Other Findings   Reproductive/Obstetrics negative OB ROS                           Anesthesia Physical Anesthesia Plan  ASA: III  Anesthesia Plan: Spinal   Post-op Pain Management:    Induction: Intravenous  Airway Management Planned: Mask  Additional Equipment:   Intra-op Plan:   Post-operative Plan:   Informed Consent: I have reviewed the patients History and Physical, chart, labs and discussed the procedure including the risks, benefits and alternatives for the proposed anesthesia with the patient or authorized representative who has indicated his/her understanding and acceptance.   Dental advisory given  Plan Discussed with: CRNA and Surgeon  Anesthesia Plan Comments:         Anesthesia Quick Evaluation

## 2011-07-21 NOTE — Progress Notes (Signed)
Utilization review completed.  

## 2011-07-21 NOTE — Transfer of Care (Signed)
Immediate Anesthesia Transfer of Care Note  Patient: [name]  Procedure(s) Performed: Procedure(s) (LRB): TOTAL HIP ARTHROPLASTY ANTERIOR APPROACH (Left)  Patient Location: PACU  Anesthesia Type: Spinal  Level of Consciousness: awake, alert , oriented and patient cooperative  Airway & Oxygen Therapy: Patient Spontanous Breathing and Patient connected to face mask oxygen  Post-op Assessment: Report given to PACU RN and Post -op Vital signs reviewed and stable  Post vital signs: Reviewed and stable  Complications: No apparent anesthesia complications

## 2011-07-21 NOTE — Anesthesia Procedure Notes (Signed)
Spinal  Patient location during procedure: OR Start time: 07/21/2011 7:47 AM End time: 07/21/2011 7:57 AM Staffing Anesthesiologist: Lestine Box B Performed by: anesthesiologist  Preanesthetic Checklist Completed: patient identified, site marked, surgical consent, pre-op evaluation, timeout performed, IV checked, risks and benefits discussed and monitors and equipment checked Spinal Block Patient position: sitting Prep: Betadine and site prepped and draped Patient monitoring: heart rate, cardiac monitor, continuous pulse ox and blood pressure Approach: right paramedian Location: L3-4 Injection technique: single-shot Needle Needle type: Quincke  Needle gauge: 25 G Needle length: 10 cm Needle insertion depth: 4 cm Assessment Sensory level: T6 Additional Notes [phone]

## 2011-07-21 NOTE — Interval H&P Note (Signed)
History and Physical Interval Note:  07/21/2011 7:37 AM  [name]  has presented today for surgery, with the diagnosis of left hip osteoarthritis  The various methods of treatment have been discussed with the patient and family. After consideration of risks, benefits and other options for treatment, the patient has consented to  Procedure(s) (LRB):LEFT TOTAL HIP ARTHROPLASTY ANTERIOR APPROACH (Left) as a surgical intervention .  The patients' history has been reviewed, patient examined, no change in status, stable for surgery.  I have reviewed the patients' chart and labs.  Questions were answered to the patient's satisfaction.     [name]

## 2011-07-21 NOTE — Op Note (Signed)
NAME:  [name]                ACCOUNT NO.: [id number]      MEDICAL RECORD NO.: [id number]      FACILITY: [facility]      PHYSICIAN:  Durene Romans D  DATE OF BIRTH:  02-15-1933     DATE OF PROCEDURE:  07/21/2011                                 OPERATIVE REPORT         PREOPERATIVE DIAGNOSIS: Left  hip osteoarthritis.      POSTOPERATIVE DIAGNOSIS:  Left hip osteoarthritis.      PROCEDURE:  Left total hip replacement through an anterior approach   utilizing DePuy THR system, component size 54mm pinnacle cup, a size 36+4 neutral   Altrex liner, a size 7Hi Tri Lock stem with a 36+5 metal   ball.      SURGEON:  Madlyn Frankel. Charlann Boxer, M.D.      ASSISTANT:  Lanney Gins, PA      ANESTHESIA:  Spinal.      SPECIMENS:  None.      COMPLICATIONS:  None.      BLOOD LOSS:  300 cc     DRAINS:  One Hemovac.      INDICATION OF THE PROCEDURE:  [name] is a 78 y.o. male who had   presented to office for evaluation of left hip pain.  Radiographs revealed   progressive degenerative changes with bone-on-bone   articulation to the  hip joint.  The patient had painful limited range of   motion significantly affecting their overall quality of life.  The patient was failing to    respond to conservative measures, and at this point was ready   to proceed with more definitive measures.  The patient has noted progressive   degenerative changes in his hip, progressive problems and dysfunction   with regarding the hip prior to surgery.  Consent was obtained for   benefit of pain relief.  Specific risk of infection, DVT, component   failure, dislocation, need for revision surgery, as well discussion of   the anterior versus posterior approach were reviewed.  Consent was   obtained for benefit of anterior pain relief through an anterior   approach.      PROCEDURE IN DETAIL:  The patient was brought to operative theater.   Once adequate anesthesia, preoperative antibiotics, 600mg  Clindamycin  administered.   The patient was positioned supine on the OSI Hanna table.  Once adequate   padding of boney process was carried out, we had predraped out the hip, and  used fluoroscopy to confirm orientation of the pelvis and position.      The left hip was then prepped and draped from proximal iliac crest to   mid thigh with shower curtain technique.      Time-out was performed identifying the patient, planned procedure, and   extremity.     An incision was then made 2 cm distal and lateral to the   anterior superior iliac spine extending over the orientation of the   tensor fascia lata muscle and sharp dissection was carried down to the   fascia of the muscle and protractor placed in the soft tissues.      The fascia was then incised.  The muscle belly was identified and swept   laterally and retractor  placed along the superior neck.  Following   cauterization of the circumflex vessels and removing some pericapsular   fat, a second cobra retractor was placed on the inferior neck.  A third   retractor was placed on the anterior acetabulum after elevating the   anterior rectus.  A L-capsulotomy was along the line of the   superior neck to the trochanteric fossa, then extended proximally and   distally.  Tag sutures were placed and the retractors were then placed   intracapsular.  We then identified the trochanteric fossa and   orientation of my neck cut, confirmed this radiographically   and then made a neck osteotomy with the femur on traction.  The femoral   head was removed without difficulty or complication.  Traction was let   off and retractors were placed posterior and anterior around the   acetabulum.      The labrum and foveal tissue were debrided.  I began reaming with a 49mm   reamer and reamed up to 53mm reamer with good bony bed preparation and a 54   cup was chosen.  The final 54mm Pinnacle cup was then impacted under fluoroscopy  to confirm the depth of penetration and  orientation with respect to   abduction.  A screw was placed followed by the hole eliminator.  The final   36+4 Altrex liner was impacted with good visualized rim fit.  The cup was positioned anatomically within the acetabular portion of the pelvis.      At this point, the femur was rolled at 80 degrees.  Further capsule was   released off the inferior aspect of the femoral neck.  I then   released the superior capsule proximally.  The hook was placed laterally   along the femur and elevated manually and held in position with the bed   hook.  The leg was then extended and adducted with the leg rolled to 100   degrees of external rotation.  Once the proximal femur was fully   exposed, I used a box osteotome to set orientation.  I then began   broaching with the starting chili pepper broach and passed this by hand and then broached up to 7.  With the 7 broach in place I chose a high offset neck and did a trial reduction.  The offset was appropriate, leg lengths   appeared to be equal, confirmed radiographically.   Given these findings, I went ahead and dislocated the hip, repositioned all   retractors and positioned the right hip in the extended and abducted position.  The final 7 Hi Tri Lock stem was   chosen and it was impacted down to the level of neck cut.  Based on this   and the trial reduction, a 36+5 metal ball was chosen and   impacted onto a clean and dry trunnion, and the hip was reduced.  The   hip had been irrigated throughout the case again at this point.  I did   reapproximate the superior capsular leaflet to the anterior leaflet   using #1 Vicryl, placed a medium Hemovac drain deep.  The fascia of the   tensor fascia lata muscle was then reapproximated using #1 Vicryl.  The   remaining wound was closed with 2-0 Vicryl and running 4-0 Monocryl.   The hip was cleaned, dried, and dressed sterilely using Dermabond and   Aquacel dressing.  Drain site dressed separately.  She was  then brought   to  recovery room in stable condition tolerating the procedure well.    Lanney Gins, PA-C was present for the entirety of the case involved from   preoperative positioning, perioperative retractor management, general   facilitation of the case, as well as primary wound closure as assistant.            Madlyn Frankel Charlann Boxer, M.D.            MDO/MEDQ  D:  12/21/2010  T:  12/21/2010  Job:  [id number]      Electronically Signed by Durene Romans M.D. on 12/27/2010 09:15:38 AM

## 2011-07-22 ENCOUNTER — Encounter (HOSPITAL_COMMUNITY): Payer: Self-pay | Admitting: Orthopedic Surgery

## 2011-07-22 LAB — CBC
HCT: 27 % — ABNORMAL LOW (ref 39.0–52.0)
Hemoglobin: 9.1 g/dL — ABNORMAL LOW (ref 13.0–17.0)
MCH: 31.5 pg (ref 26.0–34.0)
MCHC: 33.7 g/dL (ref 30.0–36.0)
MCV: 93.4 fL (ref 78.0–100.0)
Platelets: 159 10*3/uL (ref 150–400)
RBC: 2.89 MIL/uL — ABNORMAL LOW (ref 4.22–5.81)
RDW: 13.9 % (ref 11.5–15.5)
WBC: 10 10*3/uL (ref 4.0–10.5)

## 2011-07-22 LAB — PROTIME-INR
INR: 1.21 (ref 0.00–1.49)
Prothrombin Time: 15.6 seconds — ABNORMAL HIGH (ref 11.6–15.2)

## 2011-07-22 LAB — BASIC METABOLIC PANEL
BUN: 12 mg/dL (ref 6–23)
CO2: 23 mEq/L (ref 19–32)
Calcium: 7.9 mg/dL — ABNORMAL LOW (ref 8.4–10.5)
Chloride: 103 mEq/L (ref 96–112)
Creatinine, Ser: 0.82 mg/dL (ref 0.50–1.35)
GFR calc Af Amer: >90 mL/min (ref >90)
GFR calc non Af Amer: 84 mL/min — ABNORMAL LOW (ref >90)
Glucose, Bld: 125 mg/dL — ABNORMAL HIGH (ref 70–99)
Potassium: 4 mEq/L (ref 3.5–5.1)
Sodium: 134 mEq/L — ABNORMAL LOW (ref 135–145)

## 2011-07-22 MED ORDER — ASPIRIN 81 MG PO TABS: 81.0000 mg | ORAL_TABLET | Freq: Every day | ORAL | Status: DC

## 2011-07-22 MED ORDER — FERROUS SULFATE 325 (65 FE) MG PO TABS: 325.0000 mg | ORAL_TABLET | Freq: Three times a day (TID) | ORAL | Status: DC

## 2011-07-22 MED ORDER — TRAMADOL HCL 50 MG PO TABS
50.0000 mg | ORAL_TABLET | Freq: Four times a day (QID) | ORAL | Status: DC | PRN
  Administered 2011-07-22 – 2011-07-26 (×10): 100 mg via ORAL
  Filled 2011-07-22 (×9): qty 2

## 2011-07-22 MED ORDER — ACETAMINOPHEN 325 MG PO TABS: 650.0000 mg | ORAL_TABLET | Freq: Four times a day (QID) | ORAL | Status: AC | PRN

## 2011-07-22 MED ORDER — RIVAROXABAN 10 MG PO TABS: 10.0000 mg | ORAL_TABLET | Freq: Every day | ORAL | Status: DC

## 2011-07-22 MED ORDER — DIPHENHYDRAMINE HCL 25 MG PO CAPS: 25.0000 mg | ORAL_CAPSULE | Freq: Four times a day (QID) | ORAL | Status: DC | PRN

## 2011-07-22 MED ORDER — POLYETHYLENE GLYCOL 3350 17 G PO PACK: 17.0000 g | PACK | Freq: Two times a day (BID) | ORAL | Status: AC

## 2011-07-22 MED ORDER — METHOCARBAMOL 500 MG PO TABS: 500.0000 mg | ORAL_TABLET | Freq: Four times a day (QID) | ORAL | Status: AC | PRN

## 2011-07-22 MED ORDER — DSS 100 MG PO CAPS: 100.0000 mg | ORAL_CAPSULE | Freq: Two times a day (BID) | ORAL | Status: AC

## 2011-07-22 MED ORDER — METHYLPREDNISOLONE ACETATE 80 MG/ML IJ SUSP
80.0000 mg | Freq: Once | INTRAMUSCULAR | Status: DC
  Filled 2011-07-22: qty 1

## 2011-07-22 MED ORDER — TRAMADOL HCL 50 MG PO TABS: 50.0000 mg | ORAL_TABLET | Freq: Four times a day (QID) | ORAL | Status: AC | PRN

## 2011-07-22 MED ORDER — LIDOCAINE HCL (PF) 1 % IJ SOLN
5.0000 mL | Freq: Once | INTRAMUSCULAR | Status: DC
  Filled 2011-07-22: qty 5

## 2011-07-22 NOTE — Evaluation (Signed)
Occupational Therapy Evaluation Patient Details Name: BUCKY GRIGG MRN: [id number] DOB: 05-Apr-1932 Today's Date: 07/22/2011 Time: 4098-1191 OT Time Calculation (min): 25 min  OT Assessment / Plan / Recommendation Clinical Impression  Pt is a 78 year old man recovering from L THA.  Pt also has hx of extensive surgery to feet and has experienced L knee pain this hospitalization.  Pt has minimal physical assistance at home and will need to be at a supervision to mod I level.  Will follow acutely.  No equipment needs.  Recommend HHOT vs SNF depending on progress.    OT Assessment  Patient needs continued OT Services    Follow Up Recommendations  Home health OT;Supervision/Assistance - 24 hour    Barriers to Discharge Decreased caregiver support    Equipment Recommendations  None recommended by OT    Recommendations for Other Services    Frequency  Min 2X/week    Precautions / Restrictions Precautions Precautions: Fall Restrictions Weight Bearing Restrictions: No Other Position/Activity Restrictions: WBAT   Pertinent Vitals/Pain     ADL  Eating/Feeding: Performed;Independent Where Assessed - Eating/Feeding: Chair Grooming: Simulated;Set up Where Assessed - Grooming: Unsupported sitting Upper Body Bathing: Simulated;Set up Where Assessed - Upper Body Bathing: Unsupported sitting Lower Body Bathing: Simulated;Maximal assistance Where Assessed - Lower Body Bathing: Unsupported standing;Supported sit to stand Upper Body Dressing: Simulated;Set up Where Assessed - Upper Body Dressing: Unsupported sitting Lower Body Dressing: Performed;Maximal assistance Where Assessed - Lower Body Dressing: Unsupported sitting;Sopported sit to stand Toilet Transfer: Simulated;Moderate assistance Toilet Transfer Method: Stand pivot;Other (comment) (with RW) Equipment Used: Reacher;Long-handled sponge;Long-handled shoe horn;Sock aid;Rolling walker;Gait belt Transfers/Ambulation Related to ADLs:  Did not ambulate with pt in absence of second person. ADL Comments: Pt with pain in L hip and knee.  Had cortisone injection to L knee and notes improvement in pain.  Unable to access feet for ADL.    OT Diagnosis: Generalized weakness;Acute pain  OT Problem List: Decreased strength;Impaired balance (sitting and/or standing);Decreased knowledge of use of DME or AE;Pain OT Treatment Interventions: Self-care/ADL training;DME and/or AE instruction;Patient/family education   OT Goals Acute Rehab OT Goals OT Goal Formulation: With patient Potential to Achieve Goals: Good ADL Goals Pt Will Perform Grooming: with supervision;Standing at sink;Unsupported ADL Goal: Grooming - Progress: Goal set today Pt Will Perform Lower Body Bathing: with supervision;with adaptive equipment;Sit to stand from bed ADL Goal: Lower Body Bathing - Progress: Goal set today Pt Will Perform Lower Body Dressing: with supervision;with adaptive equipment;Sit to stand from bed ADL Goal: Lower Body Dressing - Progress: Goal set today Pt Will Transfer to Toilet: with supervision;3-in-1;Other (comment);Ambulation (over toilet) ADL Goal: Toilet Transfer - Progress: Goal set today Pt Will Perform Tub/Shower Transfer: Shower transfer;Grab bars;Shower seat with back;Ambulation;with supervision ADL Goal: Tub/Shower Transfer - Progress: Goal set today  Visit Information  Last OT Received On: 07/22/11 Assistance Needed: +2    Subjective Data  Subjective: "My wife has a bad back." Patient Stated Goal: Home as independent as possible.   Prior Functioning  Home Living Lives With: Spouse Available Help at Discharge: Family Type of Home: House Home Access: Ramped entrance Home Layout: One level Bathroom Shower/Tub: Other (comment) (walk in tub/shower combo with built in seat) Bathroom Toilet: Standard Bathroom Accessibility: Yes How Accessible: Accessible via walker Home Adaptive Equipment: Walker - rolling;Bedside  commode/3-in-1;Grab bars in shower;Long-handled shoehorn;Long-handled sponge;Reacher;Other (comment);Sock aid (built in Information systems manager) Prior Function Level of Independence: Independent Able to Take Stairs?: Yes Driving: Yes Vocation: Retired Musician:  No difficulties Dominant Hand: Right    Cognition  Overall Cognitive Status: Appears within functional limits for tasks assessed/performed Arousal/Alertness: Awake/alert Orientation Level: Appears intact for tasks assessed Behavior During Session: [facility] for tasks performed Cognition - Other Comments: pt not always as descriptive about home situation and equipment as wife     Extremity/Trunk Assessment Right Upper Extremity Assessment RUE ROM/Strength/Tone: Within functional levels Left Upper Extremity Assessment LUE ROM/Strength/Tone: Within functional levels   Mobility Bed Mobility Bed Mobility: Supine to Sit Supine to Sit: 3: Mod assist Details for Bed Mobility Assistance: pt already in chair upon OTs arrival Transfers Sit to Stand: 3: Mod assist;With upper extremity assist;From chair/3-in-1 Stand to Sit: 4: Min assist;With armrests;To chair/3-in-1 Details for Transfer Assistance: verbal cues for technique/hand placement   Exercise    Balance    End of Session OT - End of Session Activity Tolerance: Patient tolerated treatment well Patient left: in chair;with call bell/phone within reach;with family/visitor present   Evern Bio 07/22/2011, 3:01 PM [phone]

## 2011-07-22 NOTE — Progress Notes (Signed)
1645  Patient foley d/c 1010 has not voided yet, bladder scan shows 257, no distention, encouraged patient to drink fluids, will continue to monitor.  Sharrell Ku RN

## 2011-07-22 NOTE — Progress Notes (Signed)
Subjective: 1 Day Post-Op Procedure(s) (LRB): TOTAL HIP ARTHROPLASTY ANTERIOR APPROACH (Left)   Patient reports pain as moderate. C/O left knee pain more than hip pain. He states that he has known OA in the left knee and believes that it is wearing out. Otherwise no events throughout the night.  Objective:   VITALS:   Filed Vitals:   07/22/11 0515  BP: 101/62  Pulse: 75  Temp: 98.3 F (36.8 C)  Resp: 16    Neurovascular intact Dorsiflexion/Plantar flexion intact Incision: dressing C/D/I No cellulitis present Compartment soft  LABS  Basename 07/22/11 0410  HGB 9.1*  HCT 27.0*  WBC 10.0  PLT 159     Basename 07/22/11 0410  NA 134*  K 4.0  BUN 12  CREATININE 0.82  GLUCOSE 125*     Assessment/Plan: 1 Day Post-Op Procedure(s) (LRB): TOTAL HIP ARTHROPLASTY ANTERIOR APPROACH (Left)   HV drain d/c'ed Foley cath d/c'ed Advance diet Up with therapy D/C IV fluids Discharge home with home health tomorrow, if he continues to do well with PT.  Various options are discussed with the patient. Risks, benefits and expectations were discussed with the patient. Patient understand the risks, benefits and expectations and wishes to proceed with a left knee steroid injection.  After cleaning the area and injection of 80mg  Depo-Medrol and 5 cc of lidocaine preformed under sterile technique. This was done without difficulty and tolerated well. We will ice the left knee to help with pain and inflammation  [name]. Terral Cooks   PAC  07/22/2011, 9:36 AM

## 2011-07-22 NOTE — Progress Notes (Signed)
Clinical Social Work Department CLINICAL SOCIAL WORK PLACEMENT NOTE 07/22/2011  Patient:  [name], [name]  Account Number:  [id number] Admit date:  07/21/2011  Clinical Social Worker:  Cori Razor, LCSW  Date/time:  07/22/2011 02:21 PM  Clinical Social Work is seeking post-discharge placement for this patient at the following level of care:   SKILLED NURSING   (*CSW will update this form in Epic as items are completed)   07/22/2011  Patient/family provided with [facility] Department of Clinical Social Work's list of facilities offering this level of care within the geographic area requested by the patient (or if unable, by the patient's family).  07/22/2011  Patient/family informed of their freedom to choose among providers that offer the needed level of care, that participate in Medicare, Medicaid or managed care program needed by the patient, have an available bed and are willing to accept the patient.  07/22/2011  Patient/family informed of [facility]' ownership interest in [facility], as well as of the fact that they are under no obligation to receive care at this facility.  PASARR submitted to EDS on 07/22/2011 PASARR number received from EDS on   FL2 transmitted to all facilities in geographic area requested by pt/family on   FL2 transmitted to all facilities within larger geographic area on   Patient informed that his/her managed care company has contracts with or will negotiate with  certain facilities, including the following:     Patient/family informed of bed offers received:   Patient chooses bed at  Physician recommends and patient chooses bed at    Patient to be transferred to  on   Patient to be transferred to facility by   The following physician request were entered in Epic:   Additional Comments:  Cori Razor  LCSW  [phone]

## 2011-07-22 NOTE — Progress Notes (Signed)
Patient has still yet to void. Shows no s/sx of distention or feeling uncomfortable. I&O cath done and 200cc of urine drained from bladder. Encouraged patient to continue to drinks plenty of fluids. Will continue to monitor patient.

## 2011-07-22 NOTE — Progress Notes (Signed)
Physical Therapy Treatment Patient Details Name: [name] MRN: [id number] DOB: [birth year] Today's Date: 07/22/2011 Time: 1191-4782 PT Time Calculation (min): 20 min  PT Assessment / Plan / Recommendation Comments on Treatment Session  Decreased c/o dizziness with BP supine 118/74; sit 119/69; and stand 121/73    Follow Up Recommendations  Home health PT;Skilled nursing facility    Barriers to Discharge        Equipment Recommendations  None recommended by OT    Recommendations for Other Services OT consult  Frequency 7X/week   Plan Discharge plan remains appropriate    Precautions / Restrictions Precautions Precautions: Fall Restrictions Weight Bearing Restrictions: No Other Position/Activity Restrictions: WBAT   Pertinent Vitals/Pain     Mobility  Bed Mobility Bed Mobility: Sit to Supine Supine to Sit: 3: Mod assist Sit to Supine: 3: Mod assist Details for Bed Mobility Assistance: Cues for sequence and for use of R LE to self assist Transfers Transfers: Sit to Stand;Stand to Sit Sit to Stand: 3: Mod assist;With upper extremity assist;From chair/3-in-1 Stand to Sit: 4: Min assist;To chair/3-in-1;To bed;With upper extremity assist Details for Transfer Assistance: verbal cues for technique/hand placement Ambulation/Gait Ambulation/Gait Assistance: 3: Mod assist Ambulation/Gait: Patient Percentage: 70% Ambulation Distance (Feet): 5 Feet Assistive device: Rolling walker Ambulation/Gait Assistance Details: cues for posture, position from RW, sequence  Gait Pattern: Step-to pattern    Exercises     PT Diagnosis:    PT Problem List:   PT Treatment Interventions:     PT Goals Acute Rehab PT Goals PT Goal Formulation: With patient Time For Goal Achievement: 07/26/11 Potential to Achieve Goals: Good Pt will go Supine/Side to Sit: with supervision PT Goal: Supine/Side to Sit - Progress: Progressing toward goal Pt will go Sit to Supine/Side: with  supervision PT Goal: Sit to Supine/Side - Progress: Progressing toward goal Pt will go Sit to Stand: with supervision PT Goal: Sit to Stand - Progress: Progressing toward goal Pt will go Stand to Sit: with supervision PT Goal: Stand to Sit - Progress: Progressing toward goal Pt will Ambulate: 51 - 150 feet;with supervision;with rolling walker PT Goal: Ambulate - Progress: Progressing toward goal  Visit Information  Last PT Received On: 07/22/11 Assistance Needed: +1    Subjective Data  Subjective: My knee feels better since Dr Charlann Boxer gave me a shot earlier Patient Stated Goal: Resume previous lifestyle with decreased pain   Cognition  Overall Cognitive Status: Appears within functional limits for tasks assessed/performed Arousal/Alertness: Awake/alert Orientation Level: Appears intact for tasks assessed Behavior During Session: [facility] for tasks performed Cognition - Other Comments: pt not always as descriptive about home situation and equipment as wife     Balance     End of Session PT - End of Session Equipment Utilized During Treatment: Gait belt Activity Tolerance: Patient limited by fatigue Patient left: in bed;with call bell/phone within reach;with family/visitor present Nurse Communication: Mobility status    [name] 07/22/2011, 4:06 PM

## 2011-07-22 NOTE — Progress Notes (Signed)
Physical Therapy Treatment Patient Details Name: [name] MRN: [id number] DOB: 1932-04-17 Today's Date: 07/22/2011 Time: 7829-5621 PT Time Calculation (min): 24 min  PT Assessment / Plan / Recommendation Comments on Treatment Session  Decreased c/o dizziness with BP supine 118/74; sit 119/69; and stand 121/73    Follow Up Recommendations  Home health PT;Skilled nursing facility    Barriers to Discharge        Equipment Recommendations  None recommended by PT    Recommendations for Other Services OT consult  Frequency 7X/week   Plan Discharge plan remains appropriate    Precautions / Restrictions Restrictions Other Position/Activity Restrictions: WBAT   Pertinent Vitals/Pain 7/10 hip pain with activity - RN aware    Mobility  Bed Mobility Bed Mobility: Supine to Sit Supine to Sit: 3: Mod assist Details for Bed Mobility Assistance: cues for sequence and to self-assist with UEs and R LE Transfers Transfers: Sit to Stand;Stand to Sit Sit to Stand: 3: Mod assist Stand to Sit: 3: Mod assist Details for Transfer Assistance: cues for use of UEs and for LE management Ambulation/Gait Ambulation/Gait Assistance: 1: +2 Total assist Ambulation/Gait: Patient Percentage: 70% Ambulation Distance (Feet): 15 Feet Assistive device: Rolling walker Ambulation/Gait Assistance Details: cues for posture, sequence, position from RW, Gait Pattern: Step-to pattern    Exercises     PT Diagnosis:    PT Problem List:   PT Treatment Interventions:     PT Goals Acute Rehab PT Goals PT Goal Formulation: With patient Time For Goal Achievement: 07/26/11 Potential to Achieve Goals: Good Pt will go Supine/Side to Sit: with supervision PT Goal: Supine/Side to Sit - Progress: Progressing toward goal Pt will go Sit to Supine/Side: with supervision PT Goal: Sit to Supine/Side - Progress: Progressing toward goal Pt will go Sit to Stand: with supervision PT Goal: Sit to Stand - Progress:  Progressing toward goal Pt will go Stand to Sit: with supervision PT Goal: Stand to Sit - Progress: Progressing toward goal Pt will Ambulate: 51 - 150 feet;with supervision;with rolling walker PT Goal: Ambulate - Progress: Progressing toward goal  Visit Information  Last PT Received On: 07/22/11 Assistance Needed: +2    Subjective Data  Subjective: My knee feels better since Dr Charlann Boxer gave me a shot earlier Patient Stated Goal: Resume previous lifestyle with decreased pain   Cognition  Overall Cognitive Status: Appears within functional limits for tasks assessed/performed Arousal/Alertness: Awake/alert Orientation Level: Appears intact for tasks assessed Behavior During Session: [facility] for tasks performed    Balance     End of Session PT - End of Session Patient left: in chair;with call bell/phone within reach;with family/visitor present Nurse Communication: Mobility status    [name] 07/22/2011, 2:09 PM

## 2011-07-22 NOTE — Progress Notes (Signed)
Clinical Social Work Department BRIEF PSYCHOSOCIAL ASSESSMENT 07/22/2011  Patient:  [name], [name]     Account Number:  [id number]     Admit date:  07/21/2011  Clinical Social Worker:  Candie Chroman  Date/Time:  07/22/2011 02:07 PM  Referred by:  Physician  Date Referred:  07/22/2011 Referred for  SNF Placement   Other Referral:   Interview type:  Patient Other interview type:    PSYCHOSOCIAL DATA Living Status:  WIFE Admitted from facility:   Level of care:   Primary support name:  Lysbeth Galas Primary support relationship to patient:  SPOUSE Degree of support available:    CURRENT CONCERNS Current Concerns  Post-Acute Placement   Other Concerns:    SOCIAL WORK ASSESSMENT / PLAN Pt is a 78 yr old gentleman living at home prior to hospitalization. Met with pt/spouse to assist with d/c planning. Pt is willing to consider ST SNF placement. Search has been [place] in [place]. Bed offers will be provided as received.   Assessment/plan status:  Psychosocial Support/Ongoing Assessment of Needs Other assessment/ plan:   Information/referral to community resources:    PATIENT'S/FAMILY'S RESPONSE TO PLAN OF CARE: Pt would like to go home but will consider ST rehab if needed.     Cori Razor  LCSW  [phone]

## 2011-07-22 NOTE — Evaluation (Signed)
Physical Therapy Evaluation Patient Details Name: [name] MRN: [id number] DOB: 06-06-1932 Today's Date: 07/22/2011 Time: 0812-0850 PT Time Calculation (min): 38 min  PT Assessment / Plan / Recommendation Clinical Impression  Pt with L THR presents with decreased L LE strength/ROM, nausea, dizziness and limited functional mobility    PT Assessment  Patient needs continued PT services    Follow Up Recommendations  Home health PT;Skilled nursing facility (dependent of acute stay progress and level of assist at home)    Barriers to Discharge Decreased caregiver support      lEquipment Recommendations  None recommended by PT    Recommendations for Other Services OT consult   Frequency 7X/week    Precautions / Restrictions Restrictions Weight Bearing Restrictions: No Other Position/Activity Restrictions: WBAT   Pertinent Vitals/Pain 4/10      Mobility  Bed Mobility Bed Mobility: Supine to Sit Supine to Sit: 3: Mod assist Details for Bed Mobility Assistance: cues for sequence and to self-assist with UEs and R LE Transfers Transfers: Sit to Stand;Stand to Sit Sit to Stand: 3: Mod assist Stand to Sit: 3: Mod assist Details for Transfer Assistance: cues for use of UEs and for LE management Ambulation/Gait Ambulation/Gait Assistance: 1: +2 Total assist Ambulation/Gait: Patient Percentage: 70% Ambulation Distance (Feet): 5 Feet Assistive device: Rolling walker Ambulation/Gait Assistance Details: cues for posture, sequence, and position from RW - ltd by c/o nausea/daizziness Gait Pattern: Step-to pattern    Exercises Total Joint Exercises Ankle Circles/Pumps: AROM;10 reps;Supine;Both Quad Sets: AROM;10 reps;Supine;Both Heel Slides: AAROM;10 reps;Left;Supine Hip ABduction/ADduction: AAROM;10 reps;Left;Supine   PT Diagnosis: Difficulty walking  PT Problem List: Decreased strength;Decreased range of motion;Decreased mobility;Decreased knowledge of use of DME;Pain PT  Treatment Interventions: DME instruction;Gait training;Stair training;Functional mobility training;Therapeutic activities;Therapeutic exercise;Patient/family education   PT Goals Acute Rehab PT Goals PT Goal Formulation: With patient Time For Goal Achievement: 07/26/11 Potential to Achieve Goals: Good Pt will go Supine/Side to Sit: with supervision PT Goal: Supine/Side to Sit - Progress: Goal set today Pt will go Sit to Supine/Side: with supervision PT Goal: Sit to Supine/Side - Progress: Goal set today Pt will go Sit to Stand: with supervision PT Goal: Sit to Stand - Progress: Goal set today Pt will go Stand to Sit: with supervision PT Goal: Stand to Sit - Progress: Goal set today Pt will Ambulate: 51 - 150 feet;with supervision;with rolling walker PT Goal: Ambulate - Progress: Goal set today  Visit Information  Last PT Received On: 07/22/11 Assistance Needed: +2    Subjective Data  Subjective: My wife uses a RW and has many health issues so can not help me much but I still hope to go home Patient Stated Goal: Resume previous lifestyle with decreased pain   Prior Functioning  Home Living Lives With: Spouse Available Help at Discharge: Family Type of Home: House Home Access: Ramped entrance Home Layout: One level Home Adaptive Equipment: Walker - rolling Prior Function Level of Independence: Independent Able to Take Stairs?: Yes Driving: Yes Communication Communication: No difficulties    Cognition  Overall Cognitive Status: Appears within functional limits for tasks assessed/performed Arousal/Alertness: Awake/alert Orientation Level: Appears intact for tasks assessed Behavior During Session: [facility] for tasks performed    Extremity/Trunk Assessment Right Upper Extremity Assessment RUE ROM/Strength/Tone: Within functional levels Left Upper Extremity Assessment LUE ROM/Strength/Tone: Within functional levels Right Lower Extremity Assessment RLE ROM/Strength/Tone: Within  functional levels Left Lower Extremity Assessment LLE ROM/Strength/Tone: Deficits LLE ROM/Strength/Tone Deficits: limited to 75 * hip flex and 15* abd  2* discomfort   Balance    End of Session PT - End of Session Equipment Utilized During Treatment: Gait belt Activity Tolerance: Other (comment) (ltd by nausea) Patient left: in chair;with call bell/phone within reach;with family/visitor present Nurse Communication: Mobility status   [name] 07/22/2011, 1:00 PM

## 2011-07-23 LAB — CBC
HCT: 24.5 % — ABNORMAL LOW (ref 39.0–52.0)
Hemoglobin: 8.3 g/dL — ABNORMAL LOW (ref 13.0–17.0)
MCH: 31.3 pg (ref 26.0–34.0)
MCHC: 33.9 g/dL (ref 30.0–36.0)
MCV: 92.5 fL (ref 78.0–100.0)
Platelets: 165 10*3/uL (ref 150–400)
RBC: 2.65 MIL/uL — ABNORMAL LOW (ref 4.22–5.81)
RDW: 14.1 % (ref 11.5–15.5)
WBC: 17.2 10*3/uL — ABNORMAL HIGH (ref 4.0–10.5)

## 2011-07-23 LAB — URINALYSIS, ROUTINE W REFLEX MICROSCOPIC
Bilirubin Urine: NEGATIVE
Glucose, UA: NEGATIVE mg/dL
Ketones, ur: NEGATIVE mg/dL
Leukocytes, UA: NEGATIVE
Nitrite: NEGATIVE
Protein, ur: NEGATIVE mg/dL
Specific Gravity, Urine: 1.014 (ref 1.005–1.030)
Urobilinogen, UA: 1 mg/dL (ref 0.0–1.0)
pH: 6 (ref 5.0–8.0)

## 2011-07-23 LAB — BASIC METABOLIC PANEL
BUN: 12 mg/dL (ref 6–23)
CO2: 21 mEq/L (ref 19–32)
Calcium: 7.9 mg/dL — ABNORMAL LOW (ref 8.4–10.5)
Chloride: 97 mEq/L (ref 96–112)
Creatinine, Ser: 0.9 mg/dL (ref 0.50–1.35)
GFR calc Af Amer: >90 mL/min (ref >90)
GFR calc non Af Amer: 81 mL/min — ABNORMAL LOW (ref >90)
Glucose, Bld: 135 mg/dL — ABNORMAL HIGH (ref 70–99)
Potassium: 4.2 mEq/L (ref 3.5–5.1)
Sodium: 128 mEq/L — ABNORMAL LOW (ref 135–145)

## 2011-07-23 LAB — URINE MICROSCOPIC-ADD ON

## 2011-07-23 LAB — PROTIME-INR
INR: 1.34 (ref 0.00–1.49)
Prothrombin Time: 16.8 seconds — ABNORMAL HIGH (ref 11.6–15.2)

## 2011-07-23 MED ORDER — SODIUM CHLORIDE 0.9 % IV SOLN
INTRAVENOUS | Status: DC
  Administered 2011-07-23: 1000 mL via INTRAVENOUS
  Administered 2011-07-23 – 2011-07-25 (×4): via INTRAVENOUS

## 2011-07-23 NOTE — Progress Notes (Signed)
Physical Therapy Treatment Patient Details Name: [name] MRN: [id number] DOB: [birth year] Today's Date: 07/23/2011 Time: 1203-1223 PT Time Calculation (min): 20 min  PT Assessment / Plan / Recommendation Comments on Treatment Session  BP 130/77 with c/o dizziness and nausea    Follow Up Recommendations  Home health PT;Skilled nursing facility    Barriers to Discharge        Equipment Recommendations  None recommended by OT    Recommendations for Other Services OT consult  Frequency 7X/week   Plan Discharge plan remains appropriate    Precautions / Restrictions Precautions Precautions: Fall Restrictions Weight Bearing Restrictions: No Other Position/Activity Restrictions: WBAT   Pertinent Vitals/Pain 4/10    Mobility  Bed Mobility Bed Mobility: Supine to Sit Supine to Sit: 4: Min assist Details for Bed Mobility Assistance: Cues for sequence and for use of R LE to self assist Transfers Transfers: Sit to Stand;Stand to Sit Sit to Stand: 4: Min assist Stand to Sit: 4: Min assist Details for Transfer Assistance: verbal cues for technique/hand placement Ambulation/Gait Ambulation/Gait Assistance: 4: Min assist;2: Max Environmental consultant (Feet): 21 Feet Assistive device: Rolling walker Ambulation/Gait Assistance Details: cues for position from RW and posture; several short rests 2* nausea Gait Pattern: Step-to pattern    Exercises     PT Diagnosis:    PT Problem List:   PT Treatment Interventions:     PT Goals Acute Rehab PT Goals PT Goal Formulation: With patient Time For Goal Achievement: 07/26/11 Potential to Achieve Goals: Good Pt will go Supine/Side to Sit: with supervision PT Goal: Supine/Side to Sit - Progress: Progressing toward goal Pt will go Sit to Supine/Side: with supervision PT Goal: Sit to Supine/Side - Progress: Progressing toward goal Pt will go Sit to Stand: with supervision PT Goal: Sit to Stand - Progress: Progressing toward  goal Pt will go Stand to Sit: with supervision PT Goal: Stand to Sit - Progress: Progressing toward goal Pt will Ambulate: 51 - 150 feet;with supervision;with rolling walker PT Goal: Ambulate - Progress: Progressing toward goal  Visit Information  Last PT Received On: 07/23/11 Assistance Needed:  (for saftey 2* pt dizziness/nausea with OOB activity) PT/OT Co-Evaluation/Treatment: Yes    Subjective Data  Subjective: I think I need to go to rehab  Patient Stated Goal: Resume previous lifestyle with decreased pain   Cognition  Overall Cognitive Status: Appears within functional limits for tasks assessed/performed Arousal/Alertness: Awake/alert Orientation Level: Appears intact for tasks assessed Behavior During Session: [facility] for tasks performed    Balance     End of Session PT - End of Session Activity Tolerance: Other (comment) (ltd by nausea) Patient left: in chair;with call bell/phone within reach Nurse Communication: Mobility status    Andrzej Scully 07/23/2011, 1:22 PM

## 2011-07-23 NOTE — Progress Notes (Signed)
Patient has yet to void. Bladder scan done at 0313 and showed  336cc. Patient stated that he felt like he needed to urinate and attempted to go by himself. Patient was unsuccessful and second I&O cath done with 250cc of urine produced. Will pass on in report  to notify rounding MD of patient's issue, since he is possibly scheduled for D/C today.

## 2011-07-23 NOTE — Progress Notes (Signed)
Occupational Therapy Treatment Patient Details Name: [name] MRN: [id number] DOB: [birth year] Today's Date: 07/23/2011 Time: 4098-1191 OT Time Calculation (min): 26 min  OT Assessment / Plan / Recommendation Comments on Treatment Session  Pt limited by nausea and dizziness but is still progressing with functional mobility    Follow Up Recommendations  Skilled nursing facility--pt states he is now planning SNF as wife cant help at home.   Barriers to Discharge       Equipment Recommendations  None recommended by OT    Recommendations for Other Services    Frequency Min 2X/week   Plan      Precautions / Restrictions Precautions Precautions: Fall Restrictions Weight Bearing Restrictions: No Other Position/Activity Restrictions: WBAT        ADL  Toilet Transfer: Simulated;Minimal assistance;Other (comment) (2nd person with chair for safety due to nausea and dizziness) Toilet Transfer Method: Other (comment) (see PT ambulation note. Then chair pulled up behind pt) ADL Comments: pt limited by nausea and dizziness. Dizziness improved after sitting EOB but the nausea persisted and got worse as he continued to move. Chair pulled up after ambulation. Didnt practice hands on with AE but demonstrated/reviewed AE further with pt.     OT Diagnosis:    OT Problem List:   OT Treatment Interventions:     OT Goals ADL Goals ADL Goal: Toilet Transfer - Progress: Other (comment) (progressing with level of assist and distance but limited overall due to nausea and dizziness)  Visit Information  Last OT Received On: 07/23/11 Assistance Needed: +2 (+2 for safety due to dizziness/nausea) PT/OT Co-Evaluation/Treatment: Yes    Subjective Data  Subjective: my wife cant help me at home Patient Stated Goal: states he wants to go to rehab so he can be more independent   Prior Functioning       Cognition  Overall Cognitive Status: Appears within functional limits for tasks  assessed/performed Arousal/Alertness: Awake/alert Orientation Level: Appears intact for tasks assessed Behavior During Session: [facility] for tasks performed    Mobility Bed Mobility Bed Mobility: Supine to Sit Supine to Sit: 4: Min assist Details for Bed Mobility Assistance: Cues for sequence and for use of R LE to self assist Transfers Sit to Stand: 4: Min assist Stand to Sit: 4: Min assist Details for Transfer Assistance: verbal cues for technique/hand placement   Exercises    Balance    End of Session OT - End of Session Activity Tolerance: Patient limited by pain;Other (comment) (nausea and dizziness) Patient left: in chair;with call bell/phone within reach   Lennox Laity [phone] 07/23/2011, 1:40 PM

## 2011-07-23 NOTE — Progress Notes (Signed)
Subjective: 2 Days Post-Op Procedure(s) (LRB): TOTAL HIP ARTHROPLASTY ANTERIOR APPROACH (Left) Patient reports pain as mild.  Difficulty urinating.  Straight cath x 2.  No N/V.  Objective: Vital signs in last 24 hours: Temp:  [97.8 F (36.6 C)-98.9 F (37.2 C)] 98.9 F (37.2 C) (05/25 0458) Pulse Rate:  [89-100] 96  (05/25 0458) Resp:  [16-18] 16  (05/25 0458) BP: (110-122)/(69-74) 122/72 mmHg (05/25 0458) SpO2:  [91 %-98 %] 91 % (05/25 0940) FiO2 (%):  [21 %] 21 % (05/24 2115)  Intake/Output from previous day: 05/24 0701 - 05/25 0700 In: 1096.7 [P.O.:720; I.V.:376.7] Out: 1000 [Urine:1000] Intake/Output this shift: Total I/O In: 240 [P.O.:240] Out: -    Basename 07/23/11 0438 07/22/11 0410  HGB 8.3* 9.1*    Basename 07/23/11 0438 07/22/11 0410  WBC 17.2* 10.0  RBC 2.65* 2.89*  HCT 24.5* 27.0*  PLT 165 159    Basename 07/23/11 0438 07/22/11 0410  NA 128* 134*  K 4.2 4.0  CL 97 103  CO2 21 23  BUN 12 12  CREATININE 0.90 0.82  GLUCOSE 135* 125*  CALCIUM 7.9* 7.9*    Basename 07/23/11 0438 07/22/11 0410  LABPT -- --  INR 1.34 1.21    Wound dressed and dry.  Assessment/Plan: 2 Days Post-Op Procedure(s) (LRB): TOTAL HIP ARTHROPLASTY ANTERIOR APPROACH (Left) Continue PT.  Will send UA and place foley.  Increase fluids and check labs again tomorrow.  Toni Arthurs 07/23/2011, 10:06 AM

## 2011-07-23 NOTE — Progress Notes (Signed)
Physical Therapy Treatment Patient Details Name: [name] MRN: [id number] DOB: [birth year] Today's Date: 07/23/2011 Time: 7829-5621 PT Time Calculation (min): 22 min  PT Assessment / Plan / Recommendation Comments on Treatment Session  BP 130/77 with c/o dizziness and nausea    Follow Up Recommendations  Home health PT;Skilled nursing facility    Barriers to Discharge        Equipment Recommendations  None recommended by OT    Recommendations for Other Services OT consult  Frequency 7X/week   Plan Discharge plan remains appropriate    Precautions / Restrictions Precautions Precautions: Fall Restrictions Weight Bearing Restrictions: No Other Position/Activity Restrictions: WBAT   Pertinent Vitals/Pain 3/10    Mobility  Bed Mobility Bed Mobility: Sit to Supine Supine to Sit: 4: Min assist Sit to Supine: 4: Min assist Details for Bed Mobility Assistance: Cues for sequence and for use of R LE to self assist Transfers Transfers: Sit to Stand;Stand to Sit Sit to Stand: 4: Min assist Stand to Sit: 4: Min assist Details for Transfer Assistance: verbal cues for technique/hand placement Ambulation/Gait Ambulation/Gait Assistance: 4: Min assist;2: Max Environmental consultant (Feet): 21 Feet Assistive device: Rolling walker Ambulation/Gait Assistance Details: cues for position from RW and posture; several short rests 2* nausea Gait Pattern: Step-to pattern    Exercises Total Joint Exercises Ankle Circles/Pumps: AROM;20 reps;Supine;Both Quad Sets: AROM;20 reps;Both;Supine Gluteal Sets: AROM;10 reps;Supine;Both Heel Slides: AAROM;20 reps;Left;Supine Hip ABduction/ADduction: AAROM;20 reps;Supine;Left   PT Diagnosis:    PT Problem List:   PT Treatment Interventions:     PT Goals Acute Rehab PT Goals PT Goal Formulation: With patient Time For Goal Achievement: 07/26/11 Potential to Achieve Goals: Good Pt will go Supine/Side to Sit: with supervision PT Goal:  Supine/Side to Sit - Progress: Progressing toward goal Pt will go Sit to Supine/Side: with supervision PT Goal: Sit to Supine/Side - Progress: Progressing toward goal Pt will go Sit to Stand: with supervision PT Goal: Sit to Stand - Progress: Progressing toward goal Pt will go Stand to Sit: with supervision PT Goal: Stand to Sit - Progress: Progressing toward goal Pt will Ambulate: 51 - 150 feet;with supervision;with rolling walker PT Goal: Ambulate - Progress: Progressing toward goal  Visit Information  Last PT Received On: 07/23/11 Assistance Needed: +1 PT/OT Co-Evaluation/Treatment: Yes    Subjective Data  Subjective: I'm still getting nauseous when I move around Patient Stated Goal: Resume previous lifestyle with decreased pain   Cognition  Overall Cognitive Status: Appears within functional limits for tasks assessed/performed Arousal/Alertness: Awake/alert Orientation Level: Appears intact for tasks assessed Behavior During Session: [facility] for tasks performed    Balance     End of Session PT - End of Session Activity Tolerance: Other (comment) (pt limited by nausea with OOB activity) Patient left: in bed;with call bell/phone within reach;with family/visitor present Nurse Communication: Mobility status    [name] 07/23/2011, 2:41 PM

## 2011-07-24 LAB — CBC
HCT: 19.1 % — ABNORMAL LOW (ref 39.0–52.0)
Hemoglobin: 6.4 g/dL — CL (ref 13.0–17.0)
MCH: 31.2 pg (ref 26.0–34.0)
MCHC: 33.5 g/dL (ref 30.0–36.0)
MCV: 93.2 fL (ref 78.0–100.0)
Platelets: 137 10*3/uL — ABNORMAL LOW (ref 150–400)
RBC: 2.05 MIL/uL — ABNORMAL LOW (ref 4.22–5.81)
RDW: 14.2 % (ref 11.5–15.5)
WBC: 10.7 10*3/uL — ABNORMAL HIGH (ref 4.0–10.5)

## 2011-07-24 LAB — BASIC METABOLIC PANEL
BUN: 10 mg/dL (ref 6–23)
CO2: 23 mEq/L (ref 19–32)
Calcium: 7.1 mg/dL — ABNORMAL LOW (ref 8.4–10.5)
Chloride: 102 mEq/L (ref 96–112)
Creatinine, Ser: 0.83 mg/dL (ref 0.50–1.35)
GFR calc Af Amer: >90 mL/min (ref >90)
GFR calc non Af Amer: 83 mL/min — ABNORMAL LOW (ref >90)
Glucose, Bld: 97 mg/dL (ref 70–99)
Potassium: 4.1 mEq/L (ref 3.5–5.1)
Sodium: 129 mEq/L — ABNORMAL LOW (ref 135–145)

## 2011-07-24 LAB — PROTIME-INR
INR: 1.51 — ABNORMAL HIGH (ref 0.00–1.49)
Prothrombin Time: 18.5 seconds — ABNORMAL HIGH (ref 11.6–15.2)

## 2011-07-24 LAB — PREPARE RBC (CROSSMATCH)

## 2011-07-24 MED ORDER — DIPHENHYDRAMINE HCL 25 MG PO CAPS
25.0000 mg | ORAL_CAPSULE | Freq: Once | ORAL | Status: AC
  Administered 2011-07-24: 25 mg via ORAL

## 2011-07-24 MED ORDER — ACETAMINOPHEN 325 MG PO TABS
650.0000 mg | ORAL_TABLET | ORAL | Status: AC
  Administered 2011-07-24: 650 mg via ORAL

## 2011-07-24 NOTE — Progress Notes (Signed)
CSW spoke with Pt about SNF placement. Pt stated her would like to go to [facility].   [facility] did give a bed offer.  CSW confirmed Pt could be discharged on Mon. If medically stable.   CSW notified nurse of SNF acceptance.   CSW notified Pt of confirmation and called facility back to confirm.   Pt awaiting d/c plan.  No further needs at this time.  CSW weekday to f/u.  Leron Croak, LCSWA [employer] [phone]

## 2011-07-24 NOTE — Progress Notes (Signed)
Subjective: 3 Days Post-Op Procedure(s) (LRB): TOTAL HIP ARTHROPLASTY ANTERIOR APPROACH (Left) Patient reports pain as mild.  Reports no chest pain or SOB. Positive for lightheadedness. Tolerating diet well. Denies nosebleeds or reflux.    Objective: Vital signs in last 24 hours: Temp:  [97.7 F (36.5 C)-99.2 F (37.3 C)] 98.8 F (37.1 C) (05/26 0510) Pulse Rate:  [100-105] 100  (05/26 0510) Resp:  [17-20] 17  (05/26 0510) BP: (101-141)/(58-86) 101/58 mmHg (05/26 0510) SpO2:  [91 %-97 %] 92 % (05/26 0510)  Intake/Output from previous day: 05/25 0701 - 05/26 0700 In: 3196.3 [P.O.:840; I.V.:2356.3] Out: 1300 [Urine:1300] Intake/Output this shift:     Basename 07/24/11 0409 07/23/11 0438 07/22/11 0410  HGB 6.4* 8.3* 9.1*    Basename 07/24/11 0409 07/23/11 0438  WBC 10.7* 17.2*  RBC 2.05* 2.65*  HCT 19.1* 24.5*  PLT 137* 165    Basename 07/24/11 0409 07/23/11 0438  NA 129* 128*  K 4.1 4.2  CL 102 97  CO2 23 21  BUN 10 12  CREATININE 0.83 0.90  GLUCOSE 97 135*  CALCIUM 7.1* 7.9*    Basename 07/24/11 0409 07/23/11 0438  LABPT -- --  INR 1.51* 1.34    Neurovascular intact Intact pulses distally Dorsiflexion/Plantar flexion intact Compartment soft left thigh and lower leg Dressing clean dry and intact  Assessment/Plan: 3 Days Post-Op Procedure(s) (LRB): TOTAL HIP ARTHROPLASTY ANTERIOR APPROACH (Left) Continue foley due to blood transfusion; will continue until blood transfusion complete D/c after 2 units complete OOB this afternoon (after 2 units blood ) with PT Possible d/c to SNF Monday Will check labs in AM  Harout Scheurich W. 07/24/2011, 8:52 AM

## 2011-07-24 NOTE — Progress Notes (Signed)
CRITICAL VALUE ALERT  Critical value received:  6.4 Hgb  Date of notification:  07/24/11  Time of notification:  0510  Critical value read back:yes  Nurse who received alert:  Shari Prows   MD notified (1st page):  Dr. Victorino Dike paged several times 0530, 0550 ,0605, 0616, 0623 with no answer  Time of first page:   MD notified (2nd page):  Time of second page:  Responding MD:  Hewit   Time MD responded:  0630    New order for 2 Units PRBCs to be transfused.

## 2011-07-24 NOTE — Progress Notes (Signed)
Physical Therapy Treatment Patient Details Name: NORI POLAND MRN: [id number] DOB: 1932/04/07 Today's Date: 07/24/2011 Time: 0102-7253 PT Time Calculation (min): 16 min  PT Assessment / Plan / Recommendation Comments on Treatment Session       Follow Up Recommendations  Home health PT;Skilled nursing facility    Barriers to Discharge        Equipment Recommendations  None recommended by OT    Recommendations for Other Services OT consult  Frequency 7X/week   Plan Discharge plan remains appropriate    Precautions / Restrictions Precautions Precautions: Fall Restrictions Weight Bearing Restrictions: No Other Position/Activity Restrictions: WBAT   Pertinent Vitals/Pain 1/10 at rest; premedicated; ice pack supplied    Mobility  Bed Mobility Bed Mobility:  (OOB deferred 2* Hgb@6 .4 with blood transfusion being initiat)    Exercises Total Joint Exercises Ankle Circles/Pumps: AROM;20 reps;Supine;Both Gluteal Sets: AROM;10 reps;Supine;Both Short Arc Quad: AAROM;Both;20 reps;Supine Heel Slides: AAROM;20 reps;Left;Supine Hip ABduction/ADduction: AAROM;20 reps;Supine;Left   PT Diagnosis:    PT Problem List:   PT Treatment Interventions:     PT Goals Acute Rehab PT Goals PT Goal Formulation: With patient Time For Goal Achievement: 07/26/11 Potential to Achieve Goals: Good Pt will go Supine/Side to Sit: with supervision  Visit Information  Last PT Received On: 07/24/11 Assistance Needed: +1 PT/OT Co-Evaluation/Treatment: Yes    Subjective Data  Subjective: I feel better than they say I am doing - they're going to give me blood Patient Stated Goal: Resume previous lifestyle with decreased pain   Cognition  Overall Cognitive Status: Appears within functional limits for tasks assessed/performed Arousal/Alertness: Awake/alert Orientation Level: Appears intact for tasks assessed Behavior During Session: [facility] for tasks performed    Balance     End of Session PT -  End of Session Activity Tolerance: Patient tolerated treatment well Patient left: in bed;with call bell/phone within reach;with nursing in room Nurse Communication: Mobility status    Saliyah Gillin 07/24/2011, 11:02 AM

## 2011-07-24 NOTE — Progress Notes (Signed)
Physical Therapy Treatment Patient Details Name: [name] MRN: [id number] DOB: May 15, 1932 Today's Date: 07/24/2011 Time: 0865-7846 PT Time Calculation (min): 20 min  PT Assessment / Plan / Recommendation Comments on Treatment Session  Marked improvement in activity tolerance following one unit RBC    Follow Up Recommendations  Home health PT;Skilled nursing facility    Barriers to Discharge        Equipment Recommendations  None recommended by OT    Recommendations for Other Services OT consult  Frequency 7X/week   Plan Discharge plan remains appropriate    Precautions / Restrictions Precautions Precautions: Fall Restrictions Weight Bearing Restrictions: No Other Position/Activity Restrictions: WBAT   Pertinent Vitals/Pain Min c/o pain    Mobility  Bed Mobility Bed Mobility: Supine to Sit Sit to Supine: 4: Min assist Details for Bed Mobility Assistance: Cues for sequence and for use of R LE to self assist Transfers Transfers: Sit to Stand;Stand to Sit Sit to Stand: 4: Min assist Stand to Sit: 4: Min assist Details for Transfer Assistance: verbal cues for technique/hand placement Ambulation/Gait Ambulation/Gait Assistance: 4: Min assist Ambulation Distance (Feet): 37 Feet Assistive device: Rolling walker Ambulation/Gait Assistance Details: min cues for sequence, stride length  Gait Pattern: Step-to pattern    Exercises     PT Diagnosis:    PT Problem List:   PT Treatment Interventions:     PT Goals Acute Rehab PT Goals PT Goal Formulation: With patient Time For Goal Achievement: 07/26/11 Potential to Achieve Goals: Good Pt will go Supine/Side to Sit: with supervision PT Goal: Supine/Side to Sit - Progress: Progressing toward goal Pt will go Sit to Supine/Side: with supervision PT Goal: Sit to Supine/Side - Progress: Progressing toward goal Pt will go Sit to Stand: with supervision PT Goal: Sit to Stand - Progress: Progressing toward goal Pt will  go Stand to Sit: with supervision PT Goal: Stand to Sit - Progress: Progressing toward goal Pt will Ambulate: 51 - 150 feet;with supervision;with rolling walker PT Goal: Ambulate - Progress: Progressing toward goal  Visit Information  Last PT Received On: 07/24/11 Assistance Needed: +1    Subjective Data  Subjective: I feel less nauseous than I have (on walking) Patient Stated Goal: Resume previous lifestyle with decreased pain   Cognition  Overall Cognitive Status: Appears within functional limits for tasks assessed/performed Arousal/Alertness: Awake/alert Orientation Level: Appears intact for tasks assessed Behavior During Session: [facility] for tasks performed    Balance     End of Session PT - End of Session Activity Tolerance: Patient tolerated treatment well Patient left: in chair;with call bell/phone within reach;with family/visitor present Nurse Communication: Mobility status    Ardis Fullwood 07/24/2011, 1:04 PM

## 2011-07-25 LAB — PROTIME-INR
INR: 1.19 (ref 0.00–1.49)
Prothrombin Time: 15.4 seconds — ABNORMAL HIGH (ref 11.6–15.2)

## 2011-07-25 LAB — DIFFERENTIAL
Basophils Absolute: 0.1 10*3/uL (ref 0.0–0.1)
Basophils Relative: 1 % (ref 0–1)
Eosinophils Absolute: 0.1 10*3/uL (ref 0.0–0.7)
Eosinophils Relative: 1 % (ref 0–5)
Lymphocytes Relative: 8 % — ABNORMAL LOW (ref 12–46)
Lymphs Abs: 1 10*3/uL (ref 0.7–4.0)
Monocytes Absolute: 1.7 10*3/uL — ABNORMAL HIGH (ref 0.1–1.0)
Monocytes Relative: 14 % — ABNORMAL HIGH (ref 3–12)
Neutro Abs: 9.2 10*3/uL — ABNORMAL HIGH (ref 1.7–7.7)
Neutrophils Relative %: 76 % (ref 43–77)

## 2011-07-25 LAB — CBC
HCT: 23.8 % — ABNORMAL LOW (ref 39.0–52.0)
Hemoglobin: 8.3 g/dL — ABNORMAL LOW (ref 13.0–17.0)
MCH: 31.2 pg (ref 26.0–34.0)
MCHC: 34.9 g/dL (ref 30.0–36.0)
MCV: 89.5 fL (ref 78.0–100.0)
Platelets: 163 10*3/uL (ref 150–400)
RBC: 2.66 MIL/uL — ABNORMAL LOW (ref 4.22–5.81)
RDW: 16.1 % — ABNORMAL HIGH (ref 11.5–15.5)
WBC: 12.1 10*3/uL — ABNORMAL HIGH (ref 4.0–10.5)

## 2011-07-25 LAB — TYPE AND SCREEN
ABO/RH(D): O POS
Antibody Screen: NEGATIVE
Unit division: 0
Unit division: 0

## 2011-07-25 MED ORDER — DOXYCYCLINE HYCLATE 50 MG PO CAPS
50.0000 mg | ORAL_CAPSULE | Freq: Two times a day (BID) | ORAL | Status: DC
  Filled 2011-07-25 (×2): qty 1

## 2011-07-25 MED ORDER — BISACODYL 10 MG RE SUPP
10.0000 mg | Freq: Every day | RECTAL | Status: DC | PRN
  Administered 2011-07-25: 10 mg via RECTAL
  Filled 2011-07-25: qty 1

## 2011-07-25 MED ORDER — DOXYCYCLINE HYCLATE 100 MG PO TABS
100.0000 mg | ORAL_TABLET | Freq: Two times a day (BID) | ORAL | Status: DC
  Administered 2011-07-25 – 2011-07-26 (×3): 100 mg via ORAL
  Filled 2011-07-25 (×4): qty 1

## 2011-07-25 NOTE — Plan of Care (Signed)
Problem: Discharge Progression Outcomes Goal: Barriers To Progression Addressed/Resolved Incision to l hip , reddened, l upper and lower leg edematous. serous drainage to l hip pt started on doxycycline

## 2011-07-25 NOTE — Progress Notes (Signed)
Subjective: 4 Days Post-Op Procedure(s) (LRB): TOTAL HIP ARTHROPLASTY ANTERIOR APPROACH (Left) Patient reports pain as mild.  Denies any dizzines chest pain, SOB. Reports swelling of penis today but no dysuria. Voiding well. Positive flatus.  Objective: Vital signs in last 24 hours: Temp:  [98 F (36.7 C)-99.1 F (37.3 C)] 99.1 F (37.3 C) (05/27 0501) Pulse Rate:  [85-98] 96  (05/27 0501) Resp:  [16-24] 16  (05/27 0501) BP: (93-117)/(57-78) 108/65 mmHg (05/27 0501) SpO2:  [84 %-95 %] 91 % (05/27 0507)  Intake/Output from previous day: 05/26 0701 - 05/27 0700 In: 3077.5 [P.O.:600; I.V.:1850; Blood:627.5] Out: 775 [Urine:775] Intake/Output this shift: Total I/O In: 240 [P.O.:240] Out: 200 [Urine:200]   Basename 07/25/11 0410 07/24/11 0409 07/23/11 0438  HGB 8.3* 6.4* 8.3*    Basename 07/25/11 0410 07/24/11 0409  WBC 12.1* 10.7*  RBC 2.66* 2.05*  HCT 23.8* 19.1*  PLT 163 137*    Basename 07/24/11 0409 07/23/11 0438  NA 129* 128*  K 4.1 4.2  CL 102 97  CO2 23 21  BUN 10 12  CREATININE 0.83 0.90  GLUCOSE 97 135*  CALCIUM 7.1* 7.9*    Basename 07/25/11 0410 07/24/11 0409  LABPT -- --  INR 1.19 1.51*   Left hip slight cellulitis Incision site with scant serous drainage. Tape blisters left hip Compartment soft left hip and calf. Hip slight tenderness left calf non tender. Left foot NVI Penis no abnormal warmth no cellulitis , yet has slight edema.    Assessment/Plan: 4 Days Post-Op Procedure(s) (LRB): TOTAL HIP ARTHROPLASTY ANTERIOR APPROACH (Left) HGB improve s/p 2 units of PRBC no symptoms of ABLA secondary to surgery Hyponatremia encourage patient to drink sodas KVO IV fluids Will start patient on antibiotic for slight erythema of left hip Will hold d/c to SNF   Chelise Hanger W. 07/25/2011, 10:42 AM

## 2011-07-25 NOTE — Progress Notes (Signed)
Pt not ready for discharge today.  Plan for discharge to [facility] when medically stable.    Fleet Contras (coverage for [facility]) [phone]

## 2011-07-25 NOTE — Progress Notes (Signed)
Physical Therapy Treatment Patient Details Name: [name] MRN: [id number] DOB: May 27, 1932 Today's Date: 07/25/2011 Time: 1206-1223 PT Time Calculation (min): 17 min  PT Assessment / Plan / Recommendation Comments on Treatment Session  Continued improvement in activity tolerance    Follow Up Recommendations  Home health PT;Skilled nursing facility    Barriers to Discharge        Equipment Recommendations  None recommended by OT    Recommendations for Other Services OT consult  Frequency 7X/week   Plan Discharge plan remains appropriate    Precautions / Restrictions Precautions Precautions: Fall Restrictions Weight Bearing Restrictions: No Other Position/Activity Restrictions: WBAT   Pertinent Vitals/Pain 4/10    Mobility  Bed Mobility Bed Mobility: Sit to Supine Sit to Supine: 4: Min assist Details for Bed Mobility Assistance: Cues for sequence and for use of R LE to self assist Transfers Transfers: Sit to Stand;Stand to Sit Sit to Stand: 4: Min assist Stand to Sit: 4: Min assist Ambulation/Gait Ambulation/Gait Assistance: 4: Min assist Ambulation/Gait: Patient Percentage: 70% Ambulation Distance (Feet): 54 Feet Assistive device: Rolling walker Ambulation/Gait Assistance Details: cues for posture, position from RW and stride length Gait Pattern: Step-to pattern    Exercises     PT Diagnosis:    PT Problem List:   PT Treatment Interventions:     PT Goals Acute Rehab PT Goals Time For Goal Achievement: 07/26/11 Potential to Achieve Goals: Good Pt will go Supine/Side to Sit: with supervision PT Goal: Supine/Side to Sit - Progress: Progressing toward goal Pt will go Sit to Supine/Side: with supervision PT Goal: Sit to Supine/Side - Progress: Progressing toward goal Pt will go Sit to Stand: with supervision PT Goal: Sit to Stand - Progress: Progressing toward goal Pt will go Stand to Sit: with supervision PT Goal: Stand to Sit - Progress: Progressing  toward goal Pt will Ambulate: 51 - 150 feet;with supervision;with rolling walker PT Goal: Ambulate - Progress: Progressing toward goal  Visit Information  Last PT Received On: 07/25/11 Assistance Needed: +1    Subjective Data  Subjective: C/o dizziness on return to bed Patient Stated Goal: Resume previous lifestyle with decreased pain   Cognition  Overall Cognitive Status: Appears within functional limits for tasks assessed/performed Arousal/Alertness: Awake/alert Orientation Level: Appears intact for tasks assessed Behavior During Session: [facility] for tasks performed    Balance     End of Session PT - End of Session Equipment Utilized During Treatment: Gait belt Activity Tolerance: Patient tolerated treatment well Patient left: with call bell/phone within reach;in bed Nurse Communication: Mobility status    [name] 07/25/2011, 12:50 PM

## 2011-07-25 NOTE — Progress Notes (Signed)
Physical Therapy Treatment Patient Details Name: [name] MRN: [id number] DOB: August 21, 1932 Today's Date: 07/25/2011 Time: 7829-5621 PT Time Calculation (min): 26 min  PT Assessment / Plan / Recommendation Comments on Treatment Session  Continued improvement in activity tolerance    Follow Up Recommendations  Home health PT;Skilled nursing facility    Barriers to Discharge        Equipment Recommendations  None recommended by OT    Recommendations for Other Services OT consult  Frequency 7X/week   Plan Discharge plan remains appropriate    Precautions / Restrictions Precautions Precautions: Fall Restrictions Weight Bearing Restrictions: No Other Position/Activity Restrictions: WBAT   Pertinent Vitals/Pain 5/10; premedicated; ice pack provided    Mobility  Bed Mobility Bed Mobility: Supine to Sit Supine to Sit: 5: Supervision Transfers Transfers: Sit to Stand;Stand to Sit Sit to Stand: 4: Min assist Stand to Sit: 4: Min assist Details for Transfer Assistance: verbal cues for technique/hand placement Ambulation/Gait Ambulation/Gait Assistance: 4: Min Environmental consultant (Feet): 45 Feet Assistive device: Rolling walker Ambulation/Gait Assistance Details: cues for posture and position from RW Gait Pattern: Step-to pattern    Exercises Total Joint Exercises Ankle Circles/Pumps: AROM;20 reps;Supine;Both Quad Sets: AROM;20 reps;Both;Supine Gluteal Sets: AROM;Supine;Both;15 reps [name];Both;20 reps;Supine Heel Slides: AAROM;20 reps;Left;Supine   PT Diagnosis:    PT Problem List:   PT Treatment Interventions:     PT Goals Acute Rehab PT Goals PT Goal Formulation: With patient Time For Goal Achievement: 07/26/11 Potential to Achieve Goals: Good Pt will go Supine/Side to Sit: with supervision PT Goal: Supine/Side to Sit - Progress: Progressing toward goal Pt will go Sit to Supine/Side: with supervision PT Goal: Sit to Supine/Side -  Progress: Progressing toward goal Pt will go Sit to Stand: with supervision PT Goal: Sit to Stand - Progress: Progressing toward goal Pt will go Stand to Sit: with supervision PT Goal: Stand to Sit - Progress: Progressing toward goal Pt will Ambulate: 51 - 150 feet;with supervision;with rolling walker PT Goal: Ambulate - Progress: Progressing toward goal  Visit Information  Last PT Received On: 07/25/11 Assistance Needed: +1    Subjective Data  Subjective: I'm doing better Patient Stated Goal: Resume previous lifestyle with decreased pain   Cognition  Overall Cognitive Status: Appears within functional limits for tasks assessed/performed Arousal/Alertness: Awake/alert Orientation Level: Appears intact for tasks assessed Behavior During Session: [facility] for tasks performed    Balance     End of Session PT - End of Session Equipment Utilized During Treatment: Gait belt Activity Tolerance: Patient tolerated treatment well Patient left: in chair;with call bell/phone within reach;with family/visitor present Nurse Communication: Mobility status    [name] 07/25/2011, 10:24 AM

## 2011-07-26 LAB — PROTIME-INR
INR: 1.26 (ref 0.00–1.49)
Prothrombin Time: 16.1 seconds — ABNORMAL HIGH (ref 11.6–15.2)

## 2011-07-26 MED ORDER — DOXYCYCLINE HYCLATE 100 MG PO TABS: 100.0000 mg | ORAL_TABLET | Freq: Two times a day (BID) | ORAL | Status: AC

## 2011-07-26 NOTE — Progress Notes (Signed)
Clinical Social Work Department CLINICAL SOCIAL WORK PLACEMENT NOTE 07/26/2011  Patient:  [name], [name]  Account Number:  [id number] Admit date:  07/21/2011  Clinical Social Worker:  Cori Razor, LCSW  Date/time:  07/22/2011 02:21 PM  Clinical Social Work is seeking post-discharge placement for this patient at the following level of care:   SKILLED NURSING   (*CSW will update this form in Epic as items are completed)   07/22/2011  Patient/family provided with [facility] Department of Clinical Social Work's list of facilities offering this level of care within the geographic area requested by the patient (or if unable, by the patient's family).  07/22/2011  Patient/family informed of their freedom to choose among providers that offer the needed level of care, that participate in Medicare, Medicaid or managed care program needed by the patient, have an available bed and are willing to accept the patient.  07/22/2011  Patient/family informed of [facility]' ownership interest in [facility], as well as of the fact that they are under no obligation to receive care at this facility.  PASARR submitted to EDS on 07/22/2011 PASARR number received from [employer]DS on 07/22/2011  FL2 transmitted to all facilities in geographic area requested by pt/family on  07/22/2011 FL2 transmitted to all facilities within larger geographic area on   Patient informed that his/her managed care company has contracts with or will negotiate with  certain facilities, including the following:     Patient/family informed of bed offers received:  07/22/2011 Patient chooses bed at [place], [facility] Physician recommends and patient chooses bed at    Patient to be transferred to [place], [facility] on  07/26/2011 Patient to be transferred to facility by [facility]  The following physician request were entered in Epic:   Additional Comments:  Cori Razor  LCSW  [phone]

## 2011-07-26 NOTE — Discharge Summary (Signed)
Physician Discharge Summary  Patient ID: [name] MRN: [id number] DOB/AGE: 04/24/1932 78 y.o.  Admit date: 07/21/2011 Discharge date: 07/26/2011  Procedures:  Procedure(s) (LRB): TOTAL HIP ARTHROPLASTY ANTERIOR APPROACH (Left)  Attending Physician:  Dr. Durene Romans   Admission Diagnoses: End-stage osteoarthritis, left hip  Discharge Diagnoses:  Principal Problem:  *S/P left THA, AA Vertigo  Anxiety  Depression  COPD  Asthma  Coronary artery disease with heart stents  Hiatal hernia with previous surgery  Reflux disease  Barrett esophagitis  BPH  Hypothyroidism   HPI: This is a 78 year old gentleman with a history of end-stage osteoarthritis of the left hip, who is now scheduled for total hip arthroplasty by anterior approach to the left hip. Surgery risks, benefits, and aftercare were discussed in detail with the patient. Questions invited and answered. Note that his medical doctor is Dr. Rudi Heap, his cardiologist is Dr. Antoine Poche, his pulmonologist is Dr. Sherene Sires, and his GI doctor is Dr. Jarold Motto. His past medical history is complicated by a stent placement in the heart with history of MI; also with COPD and chronic breathing difficulties; and history of GI bleed, requiring transfusions. We will check with Dr. Jarold Motto to see which DVT prophylaxis would be most appropriate with his history of GI bleed. He is unsure whether he will be able to go home after surgery as his wife is disabled. He will probably wind up going for a short stay at a nursing facility. He is not given his home medications today. He is not a candidate for tranexamic acid or dexamethasone, and will not receive either at surgery.   PCP: Rudi Heap, MD, MD   Discharged Condition: good  Hospital Course:  Patient underwent the above stated procedure on 07/21/2011. Patient tolerated the procedure well and brought to the recovery room in good condition and subsequently to the floor.  POD #1  BP:  101/62 ; Pulse: 75 ; Temp: 98.3 F (36.8 C) ; Resp: 16  Pt's foley was removed, as well as the hemovac drain removed. IV was changed to a saline lock.  Patient reports pain as moderate. C/O left knee pain more than hip pain. He states that he has known OA in the left knee and believes that it is wearing out. Otherwise no events throughout the night.  Neurovascular intact, dorsiflexion/plantar flexion intact, incision: dressing C/D/I, no cellulitis present and compartment soft.   LABS  Basename  07/22/11 0410   HGB  9.1   HCT  27.0     POD #2  BP: 122/72 ; Pulse: 96 ; Temp: 98.9 F (37.2 C) ; Resp: 16  Patient reports pain as mild. Difficulty urinating. Straight cath x 2. No N/V. Wound dressed and dry.  Continue PT. Will send UA and place foley. Increase fluids and check labs again tomorrow.   LABS  Basename  07/23/11 0438   HGB  8.3   HCT  24.5    POD #3  BP: 101/58 ; Pulse: 100 ; Temp: 98.8 F (37.1 C) ; Resp: 17  Patient reports pain as mild. Reports no chest pain or SOB. Positive for lightheadedness. Tolerating diet well. Denies nosebleeds or reflux.  Neurovascular intact, dorsiflexion/plantar flexion intact, incision: dressing C/D/I, no cellulitis present and compartment soft.   LABS  Basename  07/24/11 0409   HGB  6.4   HCT  19.1    POD #4  BP: 101/58 ; Pulse: 100 ; Temp: 98.8 F (37.1 C) ; Resp: 17  Patient reports pain as  mild. Denies any dizzines chest pain, SOB. Reports swelling of penis today but no dysuria. Voiding well. Positive flatus.  Left hip slight cellulitis, incision site with scant serous drainage. Tape blisters left hip. Compartment soft left hip and calf. Hip slight tenderness left calf non tender. Left foot NVI. Penis no abnormal warmth no cellulitis , yet has slight edema.   LABS  Basename  07/25/11 0410   HGB  8.3   HCT  23.8    POD #5  BP: 112/64 ; Pulse: 97 ; Temp: 98.6 F (37 C) ; Resp: 21  Patient reports pain as mild, pain well controlled. Do  to some redness around the incision he was placed on doxycycline. Some blisters, 2 posterior and 1 anterior approximately 1 inch from the incision. Currently have non stick dressing on these. Per the nurse redness in the leg has gone down as well as the swelling. He had a bowel movement and has been urinating without difficulty, after having a few in and out caths during the weekend. He feels that he has greatly improved and ready to be discharged.  Neurovascular intact, dorsiflexion/plantar flexion intact, incision: dressing C/D/I, no cellulitis present and compartment soft.   LABS   No new labs    Discharge Exam:  General appearance: alert, cooperative and no distress  Extremities: Homans sign is negative, no sign of DVT, no tenderness in the calves or thighs and no ulcers, gangrene or trophic changes Some blisters around the incision.   Disposition: Skilled Nursing Facility with follow up in 2 weeks  Follow-up Information    Follow up with OLIN,Thatiana Renbarger D in 2 weeks.   Contact information:   [facility] [address], [address] [place] [address] [phone]          Discharge Orders    Future Orders Please Complete By Expires   Diet - low sodium heart healthy      Call MD / Call 911      Comments:   If you experience chest pain or shortness of breath, CALL 911 and be transported to the hospital emergency room.  If you develope a fever above 101 F, pus (white drainage) or increased drainage or redness at the wound, or calf pain, call your surgeon's office.   Constipation Prevention      Comments:   Drink plenty of fluids.  Prune juice may be helpful.  You may use a stool softener, such as Colace (over the counter) 100 mg twice a day.  Use MiraLax (over the counter) for constipation as needed.   Increase activity slowly as tolerated      Driving restrictions      Comments:   No driving for 4 weeks   TED hose      Comments:   Use stockings  (TED hose) for 2 weeks on both leg(s).  You may remove them at night for sleeping.   Change dressing      Comments:   Daily dressing changes with 4x4 guaze and tape. Keep the area dry and clean.  Xeroform dressing for blistered areas.   Discharge instructions      Comments:   Daily dressing changes with 4x4 gauze and tape. Keep the area dry and clean until follow up. Xeroform dressing for blistered areas.  Follow up in 2 weeks at [facility]. Call with any questions or concerns.        Discharge Medication List as of 07/26/2011  8:22 AM  START taking these medications   Details  acetaminophen (TYLENOL) 325 MG tablet Take 2 tablets (650 mg total) by mouth every 6 (six) hours as needed (or Fever >/= 101)., Starting 07/22/2011, Until Sat 07/21/12, No Print    diphenhydrAMINE (BENADRYL) 25 mg capsule Take 1 capsule (25 mg total) by mouth every 6 (six) hours as needed for itching, allergies or sleep., Starting 07/22/2011, Until Mon 08/01/11, No Print    docusate sodium 100 MG CAPS Take 100 mg by mouth 2 (two) times daily., Starting 07/22/2011, Until Mon 08/01/11, No Print    doxycycline (VIBRA-TABS) 100 MG tablet Take 1 tablet (100 mg total) by mouth every 12 (twelve) hours., Starting 07/26/2011, Until Fri 08/05/11, No Print    ferrous sulfate 325 (65 FE) MG tablet Take 1 tablet (325 mg total) by mouth 3 (three) times daily after meals., Starting 07/22/2011, Until Sat 07/21/12, No Print    methocarbamol (ROBAXIN) 500 MG tablet Take 1 tablet (500 mg total) by mouth every 6 (six) hours as needed (muscle spasms)., Starting 07/22/2011, Until Mon 08/01/11, Print    polyethylene glycol (MIRALAX / GLYCOLAX) packet Take 17 g by mouth 2 (two) times daily., Starting 07/22/2011, Until Mon 07/25/11, No Print    rivaroxaban (XARELTO) 10 MG TABS tablet Take 1 tablet (10 mg total) by mouth daily., Starting 07/22/2011, Until Discontinued, Print    traMADol (ULTRAM) 50 MG tablet Take 1-2 tablets (50-100 mg  total) by mouth every 6 (six) hours as needed for pain., Starting 07/22/2011, Until Mon 08/01/11, Print      CONTINUE these medications which have CHANGED   Details  aspirin 81 MG tablet Take 1 tablet (81 mg total) by mouth at bedtime., Starting 07/22/2011, Until Discontinued, No Print      CONTINUE these medications which have NOT CHANGED   Details  albuterol (PROAIR HFA) 108 (90 BASE) MCG/ACT inhaler Inhale 2 puffs into the lungs every 6 (six) hours as needed for wheezing., Starting 04/05/2011, Until Wed 04/04/12, No Print    budesonide-formoterol (SYMBICORT) 160-4.5 MCG/ACT inhaler Take 2 puffs first thing in am and then another 2 puffs about 12 hours later.   , Normal    cholecalciferol (VITAMIN D) 1000 UNITS tablet Take 1,000 Units by mouth 2 (two) times daily., Until Discontinued, Historical Med    famotidine (PEPCID) 20 MG tablet Take 20 mg by mouth at bedtime as needed. One at bedtime, Starting 07/05/2010, Until Thu 08/11/11, Historical Med    guaiFENesin (MUCINEX) 600 MG 12 hr tablet Take 600 mg by mouth. QD-BID PRN, Until Discontinued, Historical Med    hyoscyamine (LEVSIN SL) 0.125 MG SL tablet Place 0.125 mg under the tongue every 4 (four) hours as needed. For stomach cramps, Until Discontinued, Historical Med    montelukast (SINGULAIR) 10 MG tablet Take 10 mg by mouth daily as needed. For allergies, Until Discontinued, Historical Med    Multiple Vitamin (MULITIVITAMIN WITH MINERALS) TABS Take 1 tablet by mouth every evening., Until Discontinued, Historical Med    nitroGLYCERIN (NITROSTAT) 0.4 MG SL tablet Place 0.4 mg under the tongue every 5 (five) minutes as needed.  , Until Discontinued, Historical Med    pantoprazole (PROTONIX) 40 MG tablet Take 40 mg by mouth daily with breakfast., Starting 07/05/2010, Until Thu 08/11/11, Historical Med    PARLODEL 2.5 MG tablet TAKE ONE TABLET AT BEDTIME, Normal    pravastatin (PRAVACHOL) 40 MG tablet Take 20 mg by mouth at bedtime. , Until  Discontinued, Historical Med    SYNTHROID  50 MCG tablet TAKE 1 TABLET DAILY, Normal      STOP taking these medications     doxycycline (DORYX) 100 MG DR capsule Comments:  Reason for Stopping:       iron polysaccharides (NIFEREX 60) 40-20 MG capsule Comments:  Reason for Stopping:            Signed:  Anastasio Auerbach. Cynthya Yam PAC   07/26/2011, 7:39 AM

## 2011-07-26 NOTE — Progress Notes (Signed)
Patient OOB in reclining chair at this time. No c/o pain voiced and he is aware that he will be discharging to [facility] SNF today. Report called in and given to [facility] Byrd Hesselbach. Patient has been given discharge instructions and education, awaiting arrival of transportation.

## 2011-07-26 NOTE — Progress Notes (Signed)
Subjective: 5 Days Post-Op Procedure(s) (LRB): TOTAL HIP ARTHROPLASTY ANTERIOR APPROACH (Left)   Patient reports pain as mild, pain well controlled. Do to some redness around the incision he was placed on doxycycline. Some blisters, 2 posterior and 1 anterior approximately 1 inch from the incision. Currently have non stick dressing on these. Per the nurse redness in the leg has gone down as well as the swelling. He had a bowel movement and has been urinating without difficulty, after having a few in and out caths during the weekend. He feels that he has greatly improved and ready to be discharged.  Objective:   VITALS:   Filed Vitals:   07/26/11 0625  BP: 112/64  Pulse: 97  Temp: 98.6 F (37 C)  Resp: 21    Neurovascular intact Dorsiflexion/Plantar flexion intact Incision: scant drainage No cellulitis present Compartment soft  LABS  Basename 07/25/11 0410 07/24/11 0409  HGB 8.3* 6.4*  HCT 23.8* 19.1*  WBC 12.1* 10.7*  PLT 163 137*     Basename 07/24/11 0409  NA 129*  K 4.1  BUN 10  CREATININE 0.83  GLUCOSE 97     Assessment/Plan: 5 Days Post-Op Procedure(s) (LRB): TOTAL HIP ARTHROPLASTY ANTERIOR APPROACH (Left)   Up with therapy Discharge to SNF Follow up in 2 weeks at [facility].  Follow-up Information    Follow up with OLIN,Nyra Anspaugh D in 2 weeks.   Contact information:   [facility] [address], [address] [place] [address] [phone]          Anastasio Auerbach. Makaia Rappa   PAC  07/26/2011, 7:28 AM

## 2011-07-26 NOTE — Care Management Note (Signed)
    Page 1 of 2   07/26/2011     12:16:19 PM   CARE MANAGEMENT NOTE 07/26/2011  Patient:  ESPN, ZEMAN   Account Number:  [id number]  Date Initiated:  07/22/2011  Documentation initiated by:  Colleen Can  Subjective/Objective Assessment:   dx left hip osteoarthritis; anterior hip replacment     Action/Plan:   Home vs SNF  Pt wants home but is consudering SNF  comncurrently requiring 2+ ASSIST  CM WILL FOLLOW PROGRESS   Anticipated DC Date:  07/24/2011   Anticipated DC Plan:  SKILLED NURSING FACILITY  In-house referral  Clinical Social Worker      DC Planning Services  CM consult      [facility] Choice  NA   Choice offered to / List presented to:  NA   DME arranged  NA      DME agency  NA     HH arranged  NA      HH agency  NA   Status of service:  Completed, signed off Medicare Important Message given?  NO (If response is "NO", the following Medicare IM given date fields will be blank) Date Medicare IM given:   Date Additional Medicare IM given:    Discharge Disposition:  SKILLED NURSING FACILITY  Per UR Regulation:    If discussed at Long Length of Stay Meetings, dates discussed:    Comments:

## 2011-07-26 NOTE — Progress Notes (Signed)
Discharge summary sent to payer through [facility]  

## 2011-07-26 NOTE — Progress Notes (Signed)
Physician Discharge Summary  Patient ID: [name] MRN: [id number] DOB/AGE: 04/24/1932 78 y.o.  Admit date: 07/21/2011 Discharge date: 07/26/2011  Procedures:  Procedure(s) (LRB): TOTAL HIP ARTHROPLASTY ANTERIOR APPROACH (Left)  Attending Physician:  Dr. Durene [name]   Admission Diagnoses: End-stage osteoarthritis, left hip  Discharge Diagnoses:  Principal Problem:  *S/P left THA, AA Vertigo Anxiety Depression COPD Asthma Coronary artery disease with heart stents Hiatal hernia with previous surgery Reflux disease Barrett esophagitis BPH Hypothyroidism   HPI: This is a 78 year old gentleman with a history of end-stage osteoarthritis of the left hip, who is now scheduled for total hip arthroplasty by anterior approach to the left hip. Surgery risks, benefits, and aftercare were discussed in detail with the patient. Questions invited and answered. Note that his medical doctor is Dr. Rudi [name], his cardiologist is Dr. Antoine [name], his pulmonologist is Dr. Sherene [name], and his GI doctor is Dr. Jarold [name]. His past medical history is complicated by a stent placement in the heart with history of MI; also with COPD and chronic breathing difficulties; and history of GI bleed, requiring transfusions. We will check with Dr. Jarold [name] to see which DVT prophylaxis would be most appropriate with his history of GI bleed. He is unsure whether he will be able to go home after surgery as his wife is disabled. He will probably wind up going for a short stay at a nursing facility. He is not given his home medications today. He is not a candidate for tranexamic acid or dexamethasone, and will not receive either at surgery.  PCP: Allen Heap, MD, MD   Discharged Condition: good  Hospital Course:  Patient underwent the above stated procedure on 07/21/2011. Patient tolerated the procedure well and brought to the recovery room in good condition and subsequently to the floor.  POD #1 BP: 101/62 ; Pulse:  75 ; Temp: 98.3 F (36.8 C) ; Resp: 16  Pt's foley was removed, as well as the hemovac drain removed. IV was changed to a saline lock. Patient reports pain as moderate. C/O left knee pain more than hip pain. He states that he has known OA in the left knee and believes that it is wearing out. Otherwise no events throughout the night. Neurovascular intact, dorsiflexion/plantar flexion intact, incision: dressing C/[name]/I, no cellulitis present and compartment soft.   LABS  Basename  07/22/11 0410   HGB  9.1  HCT  27.0   POD #2  BP: 122/72 ; Pulse: 96 ; Temp: 98.9 F (37.2 C) ; Resp: 16  Patient reports pain as mild. Difficulty urinating. Straight cath x 2. No N/V. Wound dressed and dry. Continue PT. Will send UA and place foley. Increase fluids and check labs again tomorrow.  LABS  Basename  07/23/11 0438  HGB  8.3  HCT  24.5   POD #3  BP: 101/58 ; Pulse: 100 ; Temp: 98.8 F (37.1 C) ; Resp: 17 Patient reports pain as mild. Reports no chest pain or SOB. Positive for lightheadedness. Tolerating diet well. Denies nosebleeds or reflux. Neurovascular intact, dorsiflexion/plantar flexion intact, incision: dressing C/[name]/I, no cellulitis present and compartment soft.  LABS  Basename  07/24/11 0409  HGB  6.4  HCT  19.1   POD #4  BP: 101/58 ; Pulse: 100 ; Temp: 98.8 F (37.1 C) ; Resp: 17 Patient reports pain as mild. Denies any dizzines chest pain, SOB. Reports swelling of penis today but no dysuria. Voiding well. Positive flatus. Left hip slight cellulitis, incision site with scant serous drainage.  Tape blisters left hip. Compartment soft left hip and calf. Hip slight tenderness left calf non tender. Left foot NVI. Penis no abnormal warmth no cellulitis , yet has slight edema.  LABS  Basename  07/25/11 0410  HGB  8.3  HCT  23.8   POD #5  BP: 112/64 ; Pulse: 97 ; Temp: 98.6 F (37 C) ; Resp: 21  Patient reports pain as mild, pain well controlled. Do to some redness around the incision he  was placed on doxycycline. Some blisters, 2 posterior and 1 anterior approximately 1 inch from the incision. Currently have non stick dressing on these. Per the nurse redness in the leg has gone down as well as the swelling. He had a bowel movement and has been urinating without difficulty, after having a few in and out caths during the weekend. He feels that he has greatly improved and ready to be discharged. Neurovascular intact, dorsiflexion/plantar flexion intact, incision: dressing C/[name]/I, no cellulitis present and compartment soft.   LABS   No new labs   Discharge Exam: General appearance: alert, cooperative and no distress Extremities: Homans sign is negative, no sign of DVT, no tenderness in the calves or thighs and no ulcers, gangrene or trophic changes Some blisters around the incision.  Disposition: SNF  with follow up in 2 weeks  Follow-up Information    Follow up with OLIN,[name] in 2 weeks.   Contact information:   [facility] [address] [place] [address] [phone]          Discharge Orders    Future Orders Please Complete By Expires   Diet - low sodium heart healthy      Call MD / Call 911      Comments:   If you experience chest pain or shortness of breath, CALL 911 and be transported to the hospital emergency room.  If you develope a fever above 101 F, pus (white drainage) or increased drainage or redness at the wound, or calf pain, call your surgeon's office.   Constipation Prevention      Comments:   Drink plenty of fluids.  Prune juice may be helpful.  You may use a stool softener, such as Colace (over the counter) 100 mg twice a day.  Use MiraLax (over the counter) for constipation as needed.   Increase activity slowly as tolerated      Driving restrictions      Comments:   No driving for 4 weeks   TED hose      Comments:   Use stockings (TED hose) for 2 weeks on both leg(s).  You may remove them at  night for sleeping.   Change dressing      Comments:   Daily dressing changes with 4x4 guaze and tape. Keep the area dry and clean.  Xeroform dressing for blistered areas.   Discharge instructions      Comments:   Daily dressing changes with 4x4 gauze and tape. Keep the area dry and clean until follow up. Xeroform dressing for blistered areas.  Follow up in 2 weeks at [facility]. Call with any questions or concerns.        Current Discharge Medication List    START taking these medications   Details  acetaminophen (TYLENOL) 325 MG tablet Take 2 tablets (650 mg total) by mouth every 6 (six) hours as needed (or Fever >/= 101).    diphenhydrAMINE (BENADRYL) 25 mg capsule Take 1 capsule (25 mg  total) by mouth every 6 (six) hours as needed for itching, allergies or sleep.    docusate sodium 100 MG CAPS Take 100 mg by mouth 2 (two) times daily.    doxycycline (VIBRA-TABS) 100 MG tablet Take 1 tablet (100 mg total) by mouth every 12 (twelve) hours. Qty: 28 tablet, Refills: 0    ferrous sulfate 325 (65 FE) MG tablet Take 1 tablet (325 mg total) by mouth 3 (three) times daily after meals.    methocarbamol (ROBAXIN) 500 MG tablet Take 1 tablet (500 mg total) by mouth every 6 (six) hours as needed (muscle spasms). Qty: 50 tablet, Refills: 0    polyethylene glycol (MIRALAX / GLYCOLAX) packet Take 17 g by mouth 2 (two) times daily.    rivaroxaban (XARELTO) 10 MG TABS tablet Take 1 tablet (10 mg total) by mouth daily. Qty: 12 tablet, Refills: 0    traMADol (ULTRAM) 50 MG tablet Take 1-2 tablets (50-100 mg total) by mouth every 6 (six) hours as needed for pain. Qty: 100 tablet, Refills: 0      CONTINUE these medications which have CHANGED   Details  aspirin 81 MG tablet Take 1 tablet (81 mg total) by mouth at bedtime. Qty: 30 tablet   Comments: Resume after finishing the Xarelto      CONTINUE these medications which have NOT CHANGED   Details  albuterol (PROAIR HFA)  108 (90 BASE) MCG/ACT inhaler Inhale 2 puffs into the lungs every 6 (six) hours as needed for wheezing.    budesonide-formoterol (SYMBICORT) 160-4.5 MCG/ACT inhaler Take 2 puffs first thing in am and then another 2 puffs about 12 hours later.    Qty: 1 Inhaler, Refills: 12    cholecalciferol (VITAMIN [name]) 1000 UNITS tablet Take 1,000 Units by mouth 2 (two) times daily.    famotidine (PEPCID) 20 MG tablet Take 20 mg by mouth at bedtime as needed. One at bedtime    guaiFENesin (MUCINEX) 600 MG 12 hr tablet Take 600 mg by mouth. QD-BID PRN    hyoscyamine (LEVSIN SL) 0.125 MG SL tablet Place 0.125 mg under the tongue every 4 (four) hours as needed. For stomach cramps    montelukast (SINGULAIR) 10 MG tablet Take 10 mg by mouth daily as needed. For allergies    Multiple Vitamin (MULITIVITAMIN WITH MINERALS) TABS Take 1 tablet by mouth every evening.    nitroGLYCERIN (NITROSTAT) 0.4 MG SL tablet Place 0.4 mg under the tongue every 5 (five) minutes as needed.      pantoprazole (PROTONIX) 40 MG tablet Take 40 mg by mouth daily with breakfast.    PARLODEL 2.5 MG tablet TAKE ONE TABLET AT BEDTIME Qty: 30 each, Refills: 0   Comments: Pt needs F/U OV with MD for additional refills.    pravastatin (PRAVACHOL) 40 MG tablet Take 20 mg by mouth at bedtime.     SYNTHROID 50 MCG tablet TAKE 1 TABLET DAILY Qty: 30 each, Refills: 0   Comments: Pt needs F/U OV with MD for additional refills.      STOP taking these medications     doxycycline (DORYX) 100 MG DR capsule Comments:  Reason for Stopping:       iron polysaccharides (NIFEREX 60) 40-20 MG capsule Comments:  Reason for Stopping:            Signed: Anastasio Auerbach. Temiloluwa Recchia   PAC  07/26/2011, 7:39 AM

## 2011-07-26 NOTE — Discharge Instructions (Signed)
Total Hip Replacement  Care After  Please read the instructions outlined below. Refer to these instructions for the next few weeks. These discharge instructions provide you with general information on caring for yourself after surgery. Your caregiver may also give you more detailed instructions. If you have any problems or questions after discharge, please call your caregiver.  HOME CARE INSTRUCTIONS     It will be normal to be sore for a couple weeks following surgery. See your caregiver if this seems to be getting worse rather than better.    Only take over-the-counter or prescription medicines for pain, discomfort, or fever as directed by your caregiver.    Use showers for bathing, until seen or as instructed.    Change dressings if necessary or as directed.    You may resume normal diet and activities as directed or allowed.    Avoid lifting until you are instructed otherwise.    Make an appointment to see your caregiver for stitch or staple removal when instructed.    You may use ice on your incision for 15 to 20 minutes 3 to 4 times per day for the first two days.    Use a raised toilet seat and avoid sitting in low chairs as instructed.    Use crutches or a walker as instructed.   SEEK MEDICAL CARE IF:   There is redness, swelling, or increasing pain or tenderness in the wound.    There is pus or unusual drainage coming from the wound.    There is a foul smell coming from the wound or dressing.    The wound breaks open (the edges do not stay together) after sutures or staples have been removed.    You have any other questions or concerns.   SEEK IMMEDIATE MEDICAL CARE IF:     You have a fever.    You develop swelling of your calf or leg or there is shortness of breath, difficulty breathing, or chest pain.    You develop a rash.    You develop any reaction or side effects to medications given.    You develop increasing pain in your hip.   MAKE SURE YOU:   Understand these instructions.     Will watch your condition.    Will get help right away if you are not doing well or get worse.   Document Released: 09/03/2004 Document Revised: 02/03/2011 Document Reviewed: 12/17/2008  [employer] Patient Information 2012 [employer].

## 2011-08-22 ENCOUNTER — Other Ambulatory Visit: Payer: Self-pay | Admitting: Endocrinology

## 2011-08-24 ENCOUNTER — Other Ambulatory Visit: Payer: Self-pay | Admitting: Endocrinology

## 2011-09-08 ENCOUNTER — Other Ambulatory Visit: Payer: Self-pay | Admitting: Internal Medicine

## 2011-09-08 ENCOUNTER — Other Ambulatory Visit: Payer: Self-pay | Admitting: Endocrinology

## 2011-09-09 ENCOUNTER — Other Ambulatory Visit: Payer: Self-pay

## 2011-09-09 MED ORDER — LEVOTHYROXINE SODIUM 50 MCG PO TABS: 50.0000 ug | ORAL_TABLET | Freq: Every day | ORAL | Status: DC

## 2011-09-12 ENCOUNTER — Telehealth: Payer: Self-pay | Admitting: Internal Medicine

## 2011-09-12 ENCOUNTER — Other Ambulatory Visit: Payer: Self-pay | Admitting: Internal Medicine

## 2011-09-12 MED ORDER — DOXYCYCLINE HYCLATE 100 MG PO TABS: 100.0000 mg | ORAL_TABLET | Freq: Two times a day (BID) | ORAL | Status: DC

## 2011-09-12 NOTE — Telephone Encounter (Signed)
Rx for doxy was sent- accidentally sent without any rf so I called back and spoke with pharmacist and gave verbal order for the 5 rf. Spoke with pt and notified that the rx was sent. He verbalized understanding and states nothing further needed.

## 2011-09-12 NOTE — Telephone Encounter (Signed)
Spoke with pt. He states that when he first saw MW he was given rx for doxy to have on hand prn and he had to use last fill a few wks ago. He states would like to have another rx for this called in to keep on hand. Please advise thanks!

## 2011-09-12 NOTE — Telephone Encounter (Signed)
Ok to refill x 5   @ 100 mg bid x 10d each

## 2011-11-01 ENCOUNTER — Other Ambulatory Visit: Payer: Self-pay | Admitting: Endocrinology

## 2011-11-07 ENCOUNTER — Ambulatory Visit (INDEPENDENT_AMBULATORY_CARE_PROVIDER_SITE_OTHER): Payer: Medicare Other | Admitting: Endocrinology

## 2011-11-07 ENCOUNTER — Encounter: Payer: Self-pay | Admitting: Endocrinology

## 2011-11-07 ENCOUNTER — Other Ambulatory Visit: Payer: Medicare Other

## 2011-11-07 VITALS — BP 122/72 | HR 71 | Temp 96.6°F | Ht 67.0 in | Wt 165.0 lb

## 2011-11-07 DIAGNOSIS — E229 Hyperfunction of pituitary gland, unspecified: Secondary | ICD-10-CM

## 2011-11-07 LAB — PROLACTIN: Prolactin: 179.2 ng/mL — ABNORMAL HIGH (ref 2.1–17.1)

## 2011-11-07 NOTE — Progress Notes (Signed)
Subjective:    Patient ID: [name], male    DOB: 02-22-1933, 78 y.o.   MRN: [id number]  HPI the status of at least 3 ongoing medical problems is addressed today: pituitary macroadenoma (found incidentally on mri of the c-spine in 2009; he sees Dr Channing Mutters for this, and will have a f/u mri soon):  no headache.   hyperprolactinemia:  he still reports intermittent lightheadedness, despite reduction of the parlodel in 2011.  assoc nausea has resolved since then. He says he often takes just 1/2 of a parlodel qhs    hypothyroidism:  denies weight change.   Past Medical History  Diagnosis Date  . Emphysema   . CAD (coronary artery disease)     left main had  a 20% stenosis.  Left anterior descending had 40-50% proximal   stenosis.  Left circumflex had an 80% proximal stenosis.  The  first obtuse marginal artery had a 60% mid stenosis.  The  right  coronary artery  had  a 50-60% proximal stenosis with a 60-70%  mid stenosis  The patient was managed medically.   Marland Kitchen COPD (chronic obstructive pulmonary disease)   . Hypothyroidism   . Anemia   . Blood transfusion june 2011, 5 units given    july 2011 some units given  . Enlarged prostate     elevated psa recently  . GERD (gastroesophageal reflux disease)   . Arthritis   . Complication of anesthesia     likes spinal due to copd  . Dizziness     occasional    Past Surgical History  Procedure Date  . Coronary stent placement 5 yrs ago  . Hiatal hernia repair 01-04-2008  . Foot surgery 1994 left, 2002 right foot    bilateral foot reconstruciton  . Abdominal hernia repair  2008  . Cataract extraction both eyes   . Total hip arthroplasty 07/21/2011    Procedure: TOTAL HIP ARTHROPLASTY ANTERIOR APPROACH;  Surgeon: [name], MD;  Location: [facility];  Service: Orthopedics;  Laterality: Left;    History   Social History  . Marital Status: Married    Spouse Name: N/A    Number of Children: N/A  . Years of Education: N/A   Occupational  History  . retired    Social History Main Topics  . Smoking status: Never Smoker   . Smokeless tobacco: Never Used  . Alcohol Use: No  . Drug Use: Yes    Special: Codeine  . Sexually Active: Not on file   Other Topics Concern  . Not on file   Social History Narrative  . No narrative on file    Current Outpatient Prescriptions on File Prior to Visit  Medication Sig Dispense Refill  . acetaminophen (TYLENOL) 325 MG tablet Take 2 tablets (650 mg total) by mouth every 6 (six) hours as needed (or Fever >/= 101).      Marland Kitchen albuterol (PROAIR HFA) 108 (90 BASE) MCG/ACT inhaler Inhale 2 puffs into the lungs every 6 (six) hours as needed for wheezing.      Marland Kitchen aspirin 81 MG tablet Take 1 tablet (81 mg total) by mouth at bedtime.  30 tablet    . budesonide-formoterol (SYMBICORT) 160-4.5 MCG/ACT inhaler Take 2 puffs first thing in am and then another 2 puffs about 12 hours later.     1 Inhaler  12  . cholecalciferol (VITAMIN D) 1000 UNITS tablet Take 1,000 Units by mouth 2 (two) times daily.      Marland Kitchen  ferrous sulfate 325 (65 FE) MG tablet Take 1 tablet (325 mg total) by mouth 3 (three) times daily after meals.      Marland Kitchen guaiFENesin (MUCINEX) 600 MG 12 hr tablet Take 600 mg by mouth. QD-BID PRN      . hyoscyamine (LEVSIN SL) 0.125 MG SL tablet Place 0.125 mg under the tongue every 4 (four) hours as needed. For stomach cramps      . levothyroxine (SYNTHROID) 50 MCG tablet Take 1 tablet (50 mcg total) by mouth daily.  30 tablet  0  . montelukast (SINGULAIR) 10 MG tablet Take 10 mg by mouth daily as needed. For allergies      . Multiple Vitamin (MULITIVITAMIN WITH MINERALS) TABS Take 1 tablet by mouth every evening.      . pravastatin (PRAVACHOL) 40 MG tablet Take 20 mg by mouth at bedtime.       . nitroGLYCERIN (NITROSTAT) 0.4 MG SL tablet Place 1 tablet (0.4 mg total) under the tongue every 5 (five) minutes as needed.  25 tablet  11  . pantoprazole (PROTONIX) 40 MG tablet Take 40 mg by mouth daily with  breakfast.      . PARLODEL 2.5 MG tablet TAKE ONE TABLET AT BEDTIME  30 each  5    Allergies  Allergen Reactions  . Lortab (Hydrocodone-Acetaminophen) Nausea And Vomiting  . Penicillins Other (See Comments)    Light headed  . Zetia (Ezetimibe)     Muscle weakness  . Zocor (Simvastatin) Other (See Comments)    Nausea, muscle weakness    Family History  Problem Relation Age of Onset  . Heart disease Sister   . Heart failure Father   . Colon cancer Neg Hx     BP 122/72  Pulse 71  Temp 96.6 F (35.9 C) (Oral)  Ht 5\' 7"  (1.702 m)  Wt 165 lb (74.844 kg)  BMI 25.84 kg/m2  SpO2 95%  Review of Systems Denies LOC and visual loss.      Objective:   Physical Exam VITAL SIGNS:  See vs page GENERAL: no distress Gait is slow and unsteady BREASTS: no gynecomastia   (i reviewed TFT done at dr moore's office in oct, 2012--normal)    Assessment & Plan:  Hypothyroidism, well-replaced Hyperprolactinemia, worse Pituitary adenoma, followed by neurosurg Dizziness.   This limits parlodel rx

## 2011-11-07 NOTE — Patient Instructions (Addendum)
blood tests are being requested for you today.  You will receive a letter with results. You can continue having annual thyroid blood tests with dr Christell Constant. it is critically important to prevent falling down (keep floor areas well-lit, dry, and free of loose objects.  If you have a cane, walker, or wheelchair, you should use it, even for short trips around the house.  Also, try not to rush).  Please return in 1 year. (update: pt is advised to continue same parlodel, pending the MRI)

## 2011-11-08 ENCOUNTER — Encounter: Payer: Self-pay | Admitting: Endocrinology

## 2011-11-08 ENCOUNTER — Other Ambulatory Visit: Payer: Self-pay | Admitting: Endocrinology

## 2011-11-09 ENCOUNTER — Encounter: Payer: Self-pay | Admitting: Cardiology

## 2011-11-09 ENCOUNTER — Ambulatory Visit (INDEPENDENT_AMBULATORY_CARE_PROVIDER_SITE_OTHER): Payer: Medicare Other | Admitting: Cardiology

## 2011-11-09 VITALS — BP 100/60 | HR 63 | Ht 67.0 in | Wt 166.0 lb

## 2011-11-09 DIAGNOSIS — I9589 Other hypotension: Secondary | ICD-10-CM

## 2011-11-09 DIAGNOSIS — E785 Hyperlipidemia, unspecified: Secondary | ICD-10-CM

## 2011-11-09 DIAGNOSIS — I2510 Atherosclerotic heart disease of native coronary artery without angina pectoris: Secondary | ICD-10-CM

## 2011-11-09 DIAGNOSIS — I959 Hypotension, unspecified: Secondary | ICD-10-CM

## 2011-11-09 MED ORDER — NITROGLYCERIN 0.4 MG SL SUBL: 0.4000 mg | SUBLINGUAL_TABLET | SUBLINGUAL | Status: DC | PRN

## 2011-11-09 NOTE — Patient Instructions (Addendum)
The current medical regimen is effective;  continue present plan and medications.  Follow up in 1 year with Dr Hochrein.  You will receive a letter in the mail 2 months before you are due.  Please call us when you receive this letter to schedule your follow up appointment.  

## 2011-11-09 NOTE — Progress Notes (Signed)
HPI The patient presents for followup of his known coronary disease. Since I last saw him he had LAD arthroplasty. This was done at your stress perfusion study demonstrating no evidence of ischemia or infarct.  Since the procedure he has finished physical therapy. He spent some time in rehabilitation. He has some occasional fleeting chest pain but this has been stable. He's not having any chest pressure, neck or arm discomfort. He's had no new palpitations, presyncope or syncope. He has had no PND or orthopnea. He is fatigue and disappointed with this level of recovery. He has had some very mild lightheadedness occasionally but is not describing orthostasis.  Allergies  Allergen Reactions  . Lortab (Hydrocodone-Acetaminophen) Nausea And Vomiting  . Penicillins Other (See Comments)    Light headed  . Zetia (Ezetimibe)     Muscle weakness  . Zocor (Simvastatin) Other (See Comments)    Nausea, muscle weakness    Current Outpatient Prescriptions  Medication Sig Dispense Refill  . acetaminophen (TYLENOL) 325 MG tablet Take 2 tablets (650 mg total) by mouth every 6 (six) hours as needed (or Fever >/= 101).      Marland Kitchen albuterol (PROAIR HFA) 108 (90 BASE) MCG/ACT inhaler Inhale 2 puffs into the lungs every 6 (six) hours as needed for wheezing.      Marland Kitchen aspirin 81 MG tablet Take 1 tablet (81 mg total) by mouth at bedtime.  30 tablet    . budesonide-formoterol (SYMBICORT) 160-4.5 MCG/ACT inhaler Take 2 puffs first thing in am and then another 2 puffs about 12 hours later.     1 Inhaler  12  . cholecalciferol (VITAMIN D) 1000 UNITS tablet Take 1,000 Units by mouth 2 (two) times daily.      Marland Kitchen doxycycline (VIBRA-TABS) 100 MG tablet Take 100 mg by mouth as needed.      . ferrous sulfate 325 (65 FE) MG tablet Take 1 tablet (325 mg total) by mouth 3 (three) times daily after meals.      Marland Kitchen guaiFENesin (MUCINEX) 600 MG 12 hr tablet Take 600 mg by mouth. QD-BID PRN      . hyoscyamine (LEVSIN SL) 0.125 MG SL  tablet Place 0.125 mg under the tongue every 4 (four) hours as needed. For stomach cramps      . levothyroxine (SYNTHROID) 50 MCG tablet Take 1 tablet (50 mcg total) by mouth daily.  30 tablet  0  . montelukast (SINGULAIR) 10 MG tablet Take 10 mg by mouth daily as needed. For allergies      . Multiple Vitamin (MULITIVITAMIN WITH MINERALS) TABS Take 1 tablet by mouth every evening.      . nitroGLYCERIN (NITROSTAT) 0.4 MG SL tablet Place 0.4 mg under the tongue every 5 (five) minutes as needed.        . pantoprazole (PROTONIX) 40 MG tablet Take 40 mg by mouth daily with breakfast.      . PARLODEL 2.5 MG tablet TAKE ONE TABLET AT BEDTIME  30 each  5  . pravastatin (PRAVACHOL) 40 MG tablet Take 20 mg by mouth at bedtime.       . rivaroxaban (XARELTO) 10 MG TABS tablet Take 1 tablet (10 mg total) by mouth daily.  12 tablet  0    Past Medical History  Diagnosis Date  . Emphysema   . CAD (coronary artery disease)     left main had  a 20% stenosis.  Left anterior descending had 40-50% proximal   stenosis.  Left circumflex had an  80% proximal stenosis.  The  first obtuse marginal artery had a 60% mid stenosis.  The  right  coronary artery  had  a 50-60% proximal stenosis with a 60-70%  mid stenosis  The patient was managed medically.   Marland Kitchen COPD (chronic obstructive pulmonary disease)   . Hypothyroidism   . Anemia   . Blood transfusion june 2011, 5 units given    july 2011 some units given  . Enlarged prostate     elevated psa recently  . GERD (gastroesophageal reflux disease)   . Arthritis   . Complication of anesthesia     likes spinal due to copd  . Dizziness     occasional    Past Surgical History  Procedure Date  . Coronary stent placement 5 yrs ago  . Hiatal hernia repair 01-04-2008  . Foot surgery 1994 left, 2002 right foot    bilateral foot reconstruciton  . Abdominal hernia repair  2008  . Cataract extraction both eyes   . Total hip arthroplasty 07/21/2011    Procedure: TOTAL HIP  ARTHROPLASTY ANTERIOR APPROACH;  Surgeon: [name], MD;  Location: [facility];  Service: Orthopedics;  Laterality: Left;    ROS: Balance problems, recent pneumonias, fractured ribs.  Otherwise as stated in the HPI and negative for all other systems.  PHYSICAL EXAM BP 100/60  Pulse 63  Ht 5\' 7"  (1.702 m)  Wt 166 lb (75.297 kg)  BMI 26.00 kg/m2 GENERAL:  Well appearing HEENT:  Pupils equal round and reactive, fundi not visualized, oral mucosa unremarkable NECK:  No jugular venous distention, waveform within normal limits, carotid upstroke brisk and symmetric, no bruits, no thyromegaly LYMPHATICS:  No cervical, inguinal adenopathy LUNGS:  Clear to auscultation bilaterally BACK:  No CVA tenderness CHEST:  Unremarkable HEART:  PMI not displaced or sustained,S1 and S2 within normal limits, no S3, no S4, no clicks, no rubs, no murmurs ABD:  Flat, positive bowel sounds normal in frequency in pitch, no bruits, no rebound, no guarding, no midline pulsatile mass, no hepatomegaly, no splenomegaly EXT:  2 plus pulses upper, decreased DP/PT bilateral, mild edema, no cyanosis no clubbing   EKG:  Sinus rhythm, rate 63, axis within normal limits, intervals within normal limits, no acute ST-T wave changes.  11/09/2011   ASSESSMENT AND PLAN  CORONARY ARTERY DISEASE -  The patient has no new sypmtoms.  No further cardiovascular testing is indicated.  We will continue with aggressive risk reduction and meds as listed.  HYPERLIPIDEMIA -  I will defer to Monica Becton, MD who watches this closely.  FATIGUE - He is disappointed with his recovery from previous surgeries. However, I Zamier't see any obvious reversible cause for this. He's not anemic. For followup of his thyroid to Dr. Christell Constant.

## 2011-11-10 ENCOUNTER — Telehealth: Payer: Self-pay | Admitting: *Deleted

## 2011-11-10 NOTE — Telephone Encounter (Signed)
Called pt to inform of lab results, pt informed (letter also mailed to pt). 

## 2011-11-14 ENCOUNTER — Other Ambulatory Visit: Payer: Self-pay | Admitting: Endocrinology

## 2011-12-12 ENCOUNTER — Other Ambulatory Visit: Payer: Self-pay | Admitting: Internal Medicine

## 2012-03-15 ENCOUNTER — Other Ambulatory Visit: Payer: Self-pay | Admitting: Dermatology

## 2012-05-01 ENCOUNTER — Encounter: Payer: Self-pay | Admitting: Cardiology

## 2012-05-08 ENCOUNTER — Other Ambulatory Visit: Payer: Self-pay | Admitting: Dermatology

## 2012-05-10 ENCOUNTER — Telehealth: Payer: Self-pay | Admitting: Gastroenterology

## 2012-05-10 NOTE — Telephone Encounter (Signed)
Rec'd from [facility] forward 1 page to Dr.Patterson

## 2012-05-17 ENCOUNTER — Telehealth: Payer: Self-pay | Admitting: Family Medicine

## 2012-05-17 NOTE — Telephone Encounter (Signed)
Wants hemocult results

## 2012-05-17 NOTE — Telephone Encounter (Signed)
Pt notified of results

## 2012-07-31 ENCOUNTER — Other Ambulatory Visit (HOSPITAL_COMMUNITY): Payer: Self-pay | Admitting: Orthopaedic Surgery

## 2012-08-07 ENCOUNTER — Ambulatory Visit: Payer: Medicare Other | Admitting: Cardiology

## 2012-08-09 ENCOUNTER — Telehealth: Payer: Self-pay | Admitting: Family Medicine

## 2012-08-09 ENCOUNTER — Encounter: Payer: Self-pay | Admitting: *Deleted

## 2012-08-20 NOTE — Telephone Encounter (Signed)
appt rescheduled.

## 2012-08-22 ENCOUNTER — Encounter: Payer: Self-pay | Admitting: Cardiology

## 2012-08-22 ENCOUNTER — Ambulatory Visit (INDEPENDENT_AMBULATORY_CARE_PROVIDER_SITE_OTHER): Payer: Medicare Other | Admitting: Cardiology

## 2012-08-22 VITALS — BP 114/67 | HR 68 | Ht 67.0 in | Wt 169.6 lb

## 2012-08-22 DIAGNOSIS — I2510 Atherosclerotic heart disease of native coronary artery without angina pectoris: Secondary | ICD-10-CM

## 2012-08-22 NOTE — Patient Instructions (Addendum)
The current medical regimen is effective;  continue present plan and medications.  Follow up in 1 year with Dr Hochrein.  You will receive a letter in the mail 2 months before you are due.  Please call us when you receive this letter to schedule your follow up appointment.  

## 2012-08-22 NOTE — Progress Notes (Signed)
HPI The patient presents for followup of his known coronary disease. He is going to have right hip surgery in July.  Last year he had left hip surgery. It took him a while to recover from this. Prior to that I have seen him in he had a negative stress perfusion study. He had no cardiac complications with that procedure. Since then he's had no new symptoms. He does occasionally get some chest discomfort but it is not with activity and it is a stable pattern unchanged from symptoms he described with a stress test last year. He denies any palpitations, presyncope or syncope. He has had no PND or orthopnea. He has had no weight gain or edema.  Allergies  Allergen Reactions  . Lortab (Hydrocodone-Acetaminophen) Nausea And Vomiting  . Penicillins Other (See Comments)    Light headed  . Zetia (Ezetimibe)     Muscle weakness  . Zocor (Simvastatin) Other (See Comments)    Nausea, muscle weakness    Current Outpatient Prescriptions  Medication Sig Dispense Refill  . aspirin 81 MG tablet Take 1 tablet (81 mg total) by mouth at bedtime.  30 tablet    . cholecalciferol (VITAMIN D) 1000 UNITS tablet Take 1,000 Units by mouth 2 (two) times daily.      Marland Kitchen doxycycline (VIBRA-TABS) 100 MG tablet Take 100 mg by mouth as needed.      . ferrous sulfate 325 (65 FE) MG tablet Take 1 tablet (325 mg total) by mouth 3 (three) times daily after meals.      Marland Kitchen guaiFENesin (MUCINEX) 600 MG 12 hr tablet Take 600 mg by mouth. QD-BID PRN      . hyoscyamine (LEVSIN SL) 0.125 MG SL tablet Place 0.125 mg under the tongue every 4 (four) hours as needed. For stomach cramps      . montelukast (SINGULAIR) 10 MG tablet Take 10 mg by mouth daily as needed. For allergies      . Multiple Vitamin (MULITIVITAMIN WITH MINERALS) TABS Take 1 tablet by mouth every evening.      . nitroGLYCERIN (NITROSTAT) 0.4 MG SL tablet Place 1 tablet (0.4 mg total) under the tongue every 5 (five) minutes as needed.  25 tablet  11  . PARLODEL 2.5 MG  tablet TAKE ONE TABLET AT BEDTIME  30 each  5  . pravastatin (PRAVACHOL) 40 MG tablet Take 20 mg by mouth at bedtime.       . SYMBICORT 160-4.5 MCG/ACT inhaler 2 PUFFS EVERY 12 HOURS AS DIRECTED  10.2 g  5  . SYNTHROID 50 MCG tablet TAKE 1 TABLET DAILY  30 tablet  5  . albuterol (PROAIR HFA) 108 (90 BASE) MCG/ACT inhaler Inhale 2 puffs into the lungs every 6 (six) hours as needed for wheezing.      . pantoprazole (PROTONIX) 40 MG tablet Take 40 mg by mouth daily with breakfast.       No current facility-administered medications for this visit.    Past Medical History  Diagnosis Date  . Emphysema   . CAD (coronary artery disease)     left main had  a 20% stenosis.  Left anterior descending had 40-50% proximal   stenosis.  Left circumflex had an 80% proximal stenosis.  The  first obtuse marginal artery had a 60% mid stenosis.  The  right  coronary artery  had  a 50-60% proximal stenosis with a 60-70%  mid stenosis  The patient was managed medically.   Marland Kitchen COPD (chronic obstructive pulmonary disease)   .  Hypothyroidism   . Anemia   . Blood transfusion june 2011, 5 units given    july 2011 some units given  . Enlarged prostate     elevated psa recently  . GERD (gastroesophageal reflux disease)   . Arthritis   . Complication of anesthesia     likes spinal due to copd  . Dizziness     occasional    Past Surgical History  Procedure Laterality Date  . Coronary stent placement  5 yrs ago  . Hiatal hernia repair  01-04-2008  . Foot surgery  1994 left, 2002 right foot    bilateral foot reconstruciton  . Abdominal hernia repair   2008  . Cataract extraction both eyes    . Total hip arthroplasty  07/21/2011    Procedure: TOTAL HIP ARTHROPLASTY ANTERIOR APPROACH;  Surgeon: [name], MD;  Location: [facility];  Service: Orthopedics;  Laterality: Left;    ROS: Otherwise as stated in the HPI and negative for all other systems.  PHYSICAL EXAM BP 114/67  Pulse 68  Ht 5\' 7"  (1.702 m)  Wt  169 lb 9.6 oz (76.93 kg)  BMI 26.56 kg/m2 GENERAL:  Well appearing HEENT:  Pupils equal round and reactive, fundi not visualized, oral mucosa unremarkable NECK:  No jugular venous distention, waveform within normal limits, carotid upstroke brisk and symmetric, no bruits, no thyromegaly LYMPHATICS:  No cervical, inguinal adenopathy LUNGS:  Clear to auscultation bilaterally BACK:  No CVA tenderness CHEST:  Unremarkable HEART:  PMI not displaced or sustained,S1 and S2 within normal limits, no S3, no S4, no clicks, no rubs, no murmurs ABD:  Flat, positive bowel sounds normal in frequency in pitch, no bruits, no rebound, no guarding, no midline pulsatile mass, no hepatomegaly, no splenomegaly EXT:  2 plus pulses upper, decreased DP/PT bilateral, mild edema, no cyanosis no clubbing   ASSESSMENT AND PLAN  PREOP - The patient has had no new symptoms. Therefore, based on ACC/AHA guidelines, the patient would be at acceptable risk for the planned procedure without further cardiovascular testing.  CORONARY ARTERY DISEASE -  The patient has no new sypmtoms.  No further cardiovascular testing is indicated.  We will continue with aggressive risk reduction and meds as listed.  HYPERLIPIDEMIA -  This is followed by Monica Becton, MD who watches this closely.

## 2012-08-28 ENCOUNTER — Encounter (HOSPITAL_COMMUNITY): Payer: Self-pay | Admitting: Pharmacy Technician

## 2012-09-03 ENCOUNTER — Ambulatory Visit: Payer: Self-pay | Admitting: Family Medicine

## 2012-09-03 ENCOUNTER — Ambulatory Visit (HOSPITAL_COMMUNITY)
Admission: RE | Admit: 2012-09-03 | Discharge: 2012-09-03 | Disposition: A | Discharge status: Home / Self Care | Payer: Medicare Other | Source: Ambulatory Visit | Attending: Orthopaedic Surgery | Admitting: Orthopaedic Surgery

## 2012-09-03 ENCOUNTER — Encounter (HOSPITAL_COMMUNITY)
Admission: RE | Admit: 2012-09-03 | Discharge: 2012-09-03 | Disposition: A | Discharge status: Home / Self Care | Payer: Medicare Other | Source: Ambulatory Visit | Attending: Orthopaedic Surgery | Admitting: Orthopaedic Surgery

## 2012-09-03 ENCOUNTER — Encounter (HOSPITAL_COMMUNITY): Payer: Self-pay

## 2012-09-03 DIAGNOSIS — Z01818 Encounter for other preprocedural examination: Secondary | ICD-10-CM | POA: Insufficient documentation

## 2012-09-03 DIAGNOSIS — Z01812 Encounter for preprocedural laboratory examination: Secondary | ICD-10-CM | POA: Insufficient documentation

## 2012-09-03 DIAGNOSIS — Z01810 Encounter for preprocedural cardiovascular examination: Secondary | ICD-10-CM | POA: Insufficient documentation

## 2012-09-03 DIAGNOSIS — J438 Other emphysema: Secondary | ICD-10-CM | POA: Insufficient documentation

## 2012-09-03 DIAGNOSIS — Z4789 Encounter for other orthopedic aftercare: Secondary | ICD-10-CM | POA: Insufficient documentation

## 2012-09-03 LAB — CBC
HCT: 38.2 % — ABNORMAL LOW (ref 39.0–52.0)
Hemoglobin: 13.1 g/dL (ref 13.0–17.0)
MCH: 32.1 pg (ref 26.0–34.0)
MCHC: 34.3 g/dL (ref 30.0–36.0)
MCV: 93.6 fL (ref 78.0–100.0)
Platelets: 227 10*3/uL (ref 150–400)
RBC: 4.08 MIL/uL — ABNORMAL LOW (ref 4.22–5.81)
RDW: 14.3 % (ref 11.5–15.5)
WBC: 7 10*3/uL (ref 4.0–10.5)

## 2012-09-03 LAB — URINALYSIS, ROUTINE W REFLEX MICROSCOPIC
Bilirubin Urine: NEGATIVE
Glucose, UA: NEGATIVE mg/dL
Hgb urine dipstick: NEGATIVE
Ketones, ur: NEGATIVE mg/dL
Leukocytes, UA: NEGATIVE
Nitrite: NEGATIVE
Protein, ur: NEGATIVE mg/dL
Specific Gravity, Urine: 1.014 (ref 1.005–1.030)
Urobilinogen, UA: 0.2 mg/dL (ref 0.0–1.0)
pH: 5 (ref 5.0–8.0)

## 2012-09-03 LAB — BASIC METABOLIC PANEL
BUN: 13 mg/dL (ref 6–23)
CO2: 26 mEq/L (ref 19–32)
Calcium: 9.3 mg/dL (ref 8.4–10.5)
Chloride: 105 mEq/L (ref 96–112)
Creatinine, Ser: 0.93 mg/dL (ref 0.50–1.35)
GFR calc Af Amer: >90 mL/min (ref >90)
GFR calc non Af Amer: 79 mL/min — ABNORMAL LOW (ref >90)
Glucose, Bld: 98 mg/dL (ref 70–99)
Potassium: 3.9 mEq/L (ref 3.5–5.1)
Sodium: 139 mEq/L (ref 135–145)

## 2012-09-03 LAB — PROTIME-INR
INR: 0.95 (ref 0.00–1.49)
Prothrombin Time: 12.5 seconds (ref 11.6–15.2)

## 2012-09-03 LAB — APTT: aPTT: 34 seconds (ref 24–37)

## 2012-09-03 LAB — SURGICAL PCR SCREEN
MRSA, PCR: NEGATIVE
Staphylococcus aureus: NEGATIVE

## 2012-09-03 NOTE — Progress Notes (Signed)
09/03/12 1124  OBSTRUCTIVE SLEEP APNEA  Have you ever been diagnosed with sleep apnea through a sleep study? No  Do you snore loudly (loud enough to be heard through closed doors)?  1  Do you often feel tired, fatigued, or sleepy during the daytime? 1  Has anyone observed you stop breathing during your sleep? 1  Do you have, or are you being treated for high blood pressure? 0  BMI more than 35 kg/m2? 0  Age over 77 years old? 1  Neck circumference greater than 40 cm/18 inches? 0  Gender: 1  Obstructive Sleep Apnea Score 5  Score 4 or greater  Results sent to PCP

## 2012-09-03 NOTE — Patient Instructions (Signed)
20      Your procedure is scheduled on:   Friday 09/07/2012  Report to [facility] at  12 noon.  Call this number if you have problems the morning of surgery: [phone]   Remember:             IF YOU USE CPAP,BRING MASK AND TUBING AM OF SURGERY!   Do not eat food AFTER MIDNIGHT! MAY HAVE CLEAR LIQUIDS FROM MIDNIGHT UP UNTIL 0825 AM THEN NOTHING UNTIL AFTER SURGERY!  Take these medicines the morning of surgery with A SIP OF WATER: Symbicort inhaler,Pantoprazole   Do not bring valuables to the hospital. [facility] IS NOT RESPONSIBLE  FOR ANY BELONGINGS OR VALUABLES.  Wynelle Fanny suitcase in the car. After surgery it may be brought to your room.  For patients admitted to the hospital, checkout time is 11:00 AM the day of              Discharge.    DO NOT WEAR JEWELRY , MAKE-UP, LOTIONS,POWDERS,PERFUMES!             WOMEN -DO NOT SHAVE LEGS OR UNDERARMS 12 HRS. BEFORE SURGERY!               MEN MAY SHAVE AS USUAL!             CONTACTS,DENTURES OR BRIDGEWORK, FALSE EYELASHES MAY NOT BE WORN INTO SURGERY!                                           Patients discharged the day of surgery will not be allowed to drive home.If going home the same day of surgery, must have someone stay with you first 24 hrs.at home and arrange for someone to drive you home from the Hospital.                        YOUR DRIVER IS: Emelda Fear -spouse when discharged   Special Instructions:             Please read over the following fact sheets that you were given:             1. [facility] PREPARING FOR SURGERY SHEET              2.MRSA INFORMATION              3.[facility]                                        Telford Nab.Terryn Rosenkranz,RN,BSN     [phone]                FAILURE TO FOLLOW THESE INSTRUCTIONS MAY RESULT IN CANCELLATION OF YOUR SURGERY!               Patient Signature:___________________________

## 2012-09-03 NOTE — Progress Notes (Signed)
Chest xray fro 08/06/2010 from [facility] Medicine and office note from 05/01/2012 all on chart.

## 2012-09-07 ENCOUNTER — Inpatient Hospital Stay (HOSPITAL_COMMUNITY): Payer: Medicare Other

## 2012-09-07 ENCOUNTER — Inpatient Hospital Stay (HOSPITAL_COMMUNITY): Payer: Medicare Other | Admitting: Anesthesiology

## 2012-09-07 ENCOUNTER — Encounter (HOSPITAL_COMMUNITY): Payer: Self-pay | Admitting: Anesthesiology

## 2012-09-07 ENCOUNTER — Encounter (HOSPITAL_COMMUNITY)
Admission: RE | Disposition: A | Discharge status: Home Health | Payer: Self-pay | Source: Ambulatory Visit | Attending: Orthopaedic Surgery

## 2012-09-07 ENCOUNTER — Inpatient Hospital Stay (HOSPITAL_COMMUNITY)
Admission: RE | Admit: 2012-09-07 | Discharge: 2012-09-11 | DRG: 470 | Disposition: A | Discharge status: Home Health | Payer: Medicare Other | Source: Ambulatory Visit | Attending: Orthopaedic Surgery | Admitting: Orthopaedic Surgery

## 2012-09-07 ENCOUNTER — Encounter (HOSPITAL_COMMUNITY): Payer: Self-pay

## 2012-09-07 DIAGNOSIS — I2510 Atherosclerotic heart disease of native coronary artery without angina pectoris: Secondary | ICD-10-CM | POA: Diagnosis present

## 2012-09-07 DIAGNOSIS — E039 Hypothyroidism, unspecified: Secondary | ICD-10-CM | POA: Diagnosis present

## 2012-09-07 DIAGNOSIS — K219 Gastro-esophageal reflux disease without esophagitis: Secondary | ICD-10-CM | POA: Diagnosis present

## 2012-09-07 DIAGNOSIS — M169 Osteoarthritis of hip, unspecified: Principal | ICD-10-CM | POA: Diagnosis present

## 2012-09-07 DIAGNOSIS — J438 Other emphysema: Secondary | ICD-10-CM | POA: Diagnosis present

## 2012-09-07 DIAGNOSIS — M1610 Unilateral primary osteoarthritis, unspecified hip: Principal | ICD-10-CM | POA: Diagnosis present

## 2012-09-07 DIAGNOSIS — D649 Anemia, unspecified: Secondary | ICD-10-CM | POA: Diagnosis not present

## 2012-09-07 DIAGNOSIS — N4889 Other specified disorders of penis: Secondary | ICD-10-CM | POA: Diagnosis not present

## 2012-09-07 DIAGNOSIS — R339 Retention of urine, unspecified: Secondary | ICD-10-CM | POA: Diagnosis not present

## 2012-09-07 HISTORY — PX: TOTAL HIP ARTHROPLASTY: SHX124

## 2012-09-07 LAB — TYPE AND SCREEN
ABO/RH(D): O POS
Antibody Screen: POSITIVE
DAT, IgG: NEGATIVE
PT AG Type: NEGATIVE

## 2012-09-07 SURGERY — ARTHROPLASTY, HIP, TOTAL, ANTERIOR APPROACH
Anesthesia: Spinal | Site: Hip | Laterality: Right | Wound class: Clean

## 2012-09-07 MED ORDER — ONDANSETRON HCL 4 MG PO TABS
4.0000 mg | ORAL_TABLET | Freq: Four times a day (QID) | ORAL | Status: DC | PRN
  Administered 2012-09-10: 4 mg via ORAL
  Filled 2012-09-07: qty 1

## 2012-09-07 MED ORDER — ASPIRIN EC 325 MG PO TBEC
325.0000 mg | DELAYED_RELEASE_TABLET | Freq: Two times a day (BID) | ORAL | Status: DC
  Administered 2012-09-08 – 2012-09-11 (×7): 325 mg via ORAL
  Filled 2012-09-07 (×9): qty 1

## 2012-09-07 MED ORDER — 0.9 % SODIUM CHLORIDE (POUR BTL) OPTIME
TOPICAL | Status: DC | PRN
  Administered 2012-09-07: 1000 mL

## 2012-09-07 MED ORDER — LACTATED RINGERS IV SOLN
INTRAVENOUS | Status: DC
  Administered 2012-09-07: 1000 mL via INTRAVENOUS
  Administered 2012-09-07 (×2): via INTRAVENOUS

## 2012-09-07 MED ORDER — POLYETHYLENE GLYCOL 3350 17 G PO PACK: 17.0000 g | PACK | Freq: Every day | ORAL | Status: DC | PRN

## 2012-09-07 MED ORDER — PHENOL 1.4 % MT LIQD
1.0000 | OROMUCOSAL | Status: DC | PRN
  Filled 2012-09-07: qty 177

## 2012-09-07 MED ORDER — PROPOFOL INFUSION 10 MG/ML OPTIME
INTRAVENOUS | Status: DC | PRN
  Administered 2012-09-07: 100 ug/kg/min via INTRAVENOUS

## 2012-09-07 MED ORDER — ACETAMINOPHEN 325 MG PO TABS: 650.0000 mg | ORAL_TABLET | Freq: Four times a day (QID) | ORAL | Status: DC | PRN

## 2012-09-07 MED ORDER — FENTANYL CITRATE 0.05 MG/ML IJ SOLN
INTRAMUSCULAR | Status: DC | PRN
  Administered 2012-09-07: 50 ug via INTRAVENOUS
  Administered 2012-09-07 (×2): 25 ug via INTRAVENOUS

## 2012-09-07 MED ORDER — GLYCOPYRROLATE 0.2 MG/ML IJ SOLN
INTRAMUSCULAR | Status: DC | PRN
  Administered 2012-09-07: 0.2 mg via INTRAVENOUS

## 2012-09-07 MED ORDER — METOCLOPRAMIDE HCL 10 MG PO TABS
5.0000 mg | ORAL_TABLET | Freq: Three times a day (TID) | ORAL | Status: DC | PRN
  Administered 2012-09-10: 10 mg via ORAL
  Filled 2012-09-07: qty 1

## 2012-09-07 MED ORDER — METOCLOPRAMIDE HCL 5 MG/ML IJ SOLN
5.0000 mg | Freq: Three times a day (TID) | INTRAMUSCULAR | Status: DC | PRN
  Administered 2012-09-07: 10 mg via INTRAVENOUS
  Filled 2012-09-07: qty 2

## 2012-09-07 MED ORDER — FERROUS SULFATE 325 (65 FE) MG PO TABS
325.0000 mg | ORAL_TABLET | Freq: Three times a day (TID) | ORAL | Status: DC
  Administered 2012-09-08 – 2012-09-11 (×10): 325 mg via ORAL
  Filled 2012-09-07 (×14): qty 1

## 2012-09-07 MED ORDER — ACETAMINOPHEN 650 MG RE SUPP: 650.0000 mg | Freq: Four times a day (QID) | RECTAL | Status: DC | PRN

## 2012-09-07 MED ORDER — LEVOTHYROXINE SODIUM 50 MCG PO TABS
50.0000 ug | ORAL_TABLET | Freq: Every day | ORAL | Status: DC
  Administered 2012-09-08 – 2012-09-10 (×3): 50 ug via ORAL
  Filled 2012-09-07 (×5): qty 1

## 2012-09-07 MED ORDER — PANTOPRAZOLE SODIUM 40 MG PO TBEC
40.0000 mg | DELAYED_RELEASE_TABLET | Freq: Every day | ORAL | Status: DC
  Administered 2012-09-08 – 2012-09-11 (×4): 40 mg via ORAL
  Filled 2012-09-07 (×7): qty 1

## 2012-09-07 MED ORDER — LIDOCAINE HCL (CARDIAC) 20 MG/ML IV SOLN
INTRAVENOUS | Status: DC | PRN
  Administered 2012-09-07: 50 mg via INTRAVENOUS

## 2012-09-07 MED ORDER — DEXTROSE 5 % IV SOLN
500.0000 mg | Freq: Four times a day (QID) | INTRAVENOUS | Status: DC | PRN
  Administered 2012-09-08: 500 mg via INTRAVENOUS
  Filled 2012-09-07 (×2): qty 5

## 2012-09-07 MED ORDER — CLINDAMYCIN PHOSPHATE 600 MG/50ML IV SOLN
600.0000 mg | Freq: Four times a day (QID) | INTRAVENOUS | Status: AC
  Administered 2012-09-07 – 2012-09-08 (×2): 600 mg via INTRAVENOUS
  Filled 2012-09-07 (×2): qty 50

## 2012-09-07 MED ORDER — PROPOFOL 10 MG/ML IV BOLUS
INTRAVENOUS | Status: DC | PRN
  Administered 2012-09-07 (×2): 30 mg via INTRAVENOUS

## 2012-09-07 MED ORDER — PHENYLEPHRINE HCL 10 MG/ML IJ SOLN
10.0000 mg | INTRAVENOUS | Status: DC | PRN
  Administered 2012-09-07: 25 ug/min via INTRAVENOUS

## 2012-09-07 MED ORDER — TRAMADOL HCL 50 MG PO TABS: 100.0000 mg | ORAL_TABLET | Freq: Four times a day (QID) | ORAL | Status: DC | PRN

## 2012-09-07 MED ORDER — ZOLPIDEM TARTRATE 5 MG PO TABS: 5.0000 mg | ORAL_TABLET | Freq: Every evening | ORAL | Status: DC | PRN

## 2012-09-07 MED ORDER — PHENYLEPHRINE HCL 10 MG/ML IJ SOLN
INTRAMUSCULAR | Status: DC | PRN
  Administered 2012-09-07 (×5): 80 ug via INTRAVENOUS

## 2012-09-07 MED ORDER — ONDANSETRON HCL 4 MG/2ML IJ SOLN
4.0000 mg | Freq: Four times a day (QID) | INTRAMUSCULAR | Status: DC | PRN
  Administered 2012-09-08 (×2): 4 mg via INTRAVENOUS
  Filled 2012-09-07 (×2): qty 2

## 2012-09-07 MED ORDER — ADULT MULTIVITAMIN W/MINERALS CH
1.0000 | ORAL_TABLET | Freq: Every evening | ORAL | Status: DC
  Administered 2012-09-08 – 2012-09-10 (×3): 1 via ORAL
  Filled 2012-09-07 (×5): qty 1

## 2012-09-07 MED ORDER — DOCUSATE SODIUM 100 MG PO CAPS
100.0000 mg | ORAL_CAPSULE | Freq: Two times a day (BID) | ORAL | Status: DC
  Administered 2012-09-08 – 2012-09-11 (×7): 100 mg via ORAL

## 2012-09-07 MED ORDER — MEPERIDINE HCL 50 MG/ML IJ SOLN: 6.2500 mg | INTRAMUSCULAR | Status: DC | PRN

## 2012-09-07 MED ORDER — BROMOCRIPTINE MESYLATE 2.5 MG PO TABS
2.5000 mg | ORAL_TABLET | Freq: Every day | ORAL | Status: DC
Start: 2012-09-07 — End: 2012-09-11
  Administered 2012-09-08 – 2012-09-10 (×3): 2.5 mg via ORAL
  Filled 2012-09-07: qty 1

## 2012-09-07 MED ORDER — HYDROMORPHONE HCL PF 1 MG/ML IJ SOLN
1.0000 mg | INTRAMUSCULAR | Status: DC | PRN
  Administered 2012-09-07 – 2012-09-08 (×3): 1 mg via INTRAVENOUS
  Filled 2012-09-07 (×3): qty 1

## 2012-09-07 MED ORDER — VANCOMYCIN HCL IN DEXTROSE 1-5 GM/200ML-% IV SOLN
1000.0000 mg | INTRAVENOUS | Status: AC
  Administered 2012-09-07: 1000 mg via INTRAVENOUS

## 2012-09-07 MED ORDER — BUDESONIDE-FORMOTEROL FUMARATE 160-4.5 MCG/ACT IN AERO
2.0000 | INHALATION_SPRAY | Freq: Two times a day (BID) | RESPIRATORY_TRACT | Status: DC
  Administered 2012-09-07 – 2012-09-11 (×8): 2 via RESPIRATORY_TRACT
  Filled 2012-09-07: qty 6

## 2012-09-07 MED ORDER — DIPHENHYDRAMINE HCL 12.5 MG/5ML PO ELIX: 12.5000 mg | ORAL_SOLUTION | ORAL | Status: DC | PRN

## 2012-09-07 MED ORDER — ALBUTEROL SULFATE HFA 108 (90 BASE) MCG/ACT IN AERS
2.0000 | INHALATION_SPRAY | Freq: Four times a day (QID) | RESPIRATORY_TRACT | Status: DC | PRN
  Filled 2012-09-07: qty 6.7

## 2012-09-07 MED ORDER — BISACODYL 5 MG PO TBEC: 5.0000 mg | DELAYED_RELEASE_TABLET | Freq: Every day | ORAL | Status: DC | PRN

## 2012-09-07 MED ORDER — BUPIVACAINE HCL (PF) 0.5 % IJ SOLN
INTRAMUSCULAR | Status: AC
  Filled 2012-09-07: qty 30

## 2012-09-07 MED ORDER — FENTANYL CITRATE 0.05 MG/ML IJ SOLN: 25.0000 ug | INTRAMUSCULAR | Status: DC | PRN

## 2012-09-07 MED ORDER — OXYCODONE HCL 5 MG PO TABS
5.0000 mg | ORAL_TABLET | ORAL | Status: DC | PRN
  Administered 2012-09-08 – 2012-09-10 (×9): 10 mg via ORAL
  Administered 2012-09-11: 5 mg via ORAL
  Administered 2012-09-11: 10 mg via ORAL
  Filled 2012-09-07 (×6): qty 2
  Filled 2012-09-07: qty 1
  Filled 2012-09-07 (×5): qty 2
  Filled 2012-09-07: qty 1

## 2012-09-07 MED ORDER — PROMETHAZINE HCL 25 MG/ML IJ SOLN: 6.2500 mg | INTRAMUSCULAR | Status: DC | PRN

## 2012-09-07 MED ORDER — BUPIVACAINE IN DEXTROSE 0.75-8.25 % IT SOLN
INTRATHECAL | Status: DC | PRN
  Administered 2012-09-07: 3 mL via INTRATHECAL

## 2012-09-07 MED ORDER — METHOCARBAMOL 500 MG PO TABS
500.0000 mg | ORAL_TABLET | Freq: Four times a day (QID) | ORAL | Status: DC | PRN
  Administered 2012-09-09 – 2012-09-10 (×3): 500 mg via ORAL
  Filled 2012-09-07 (×5): qty 1

## 2012-09-07 MED ORDER — MENTHOL 3 MG MT LOZG
1.0000 | LOZENGE | OROMUCOSAL | Status: DC | PRN
  Filled 2012-09-07 (×4): qty 9

## 2012-09-07 MED ORDER — ALBUTEROL SULFATE HFA 108 (90 BASE) MCG/ACT IN AERS
INHALATION_SPRAY | RESPIRATORY_TRACT | Status: AC
  Filled 2012-09-07: qty 6.7

## 2012-09-07 MED ORDER — BROMOCRIPTINE MESYLATE 2.5 MG PO TABS
2.5000 mg | ORAL_TABLET | Freq: Every day | ORAL | Status: DC
  Filled 2012-09-07: qty 1

## 2012-09-07 MED ORDER — TRANEXAMIC ACID 100 MG/ML IV SOLN
1000.0000 mg | INTRAVENOUS | Status: DC
  Filled 2012-09-07: qty 10

## 2012-09-07 MED ORDER — NITROGLYCERIN 0.4 MG SL SUBL: 0.4000 mg | SUBLINGUAL_TABLET | SUBLINGUAL | Status: DC | PRN

## 2012-09-07 MED ORDER — SODIUM CHLORIDE 0.9 % IV SOLN
INTRAVENOUS | Status: DC
  Administered 2012-09-07 – 2012-09-08 (×2): via INTRAVENOUS

## 2012-09-07 SURGICAL SUPPLY — 37 items
BAG ZIPLOCK 12X15 (MISCELLANEOUS) ×4 IMPLANT
BLADE SAW SGTL 18X1.27X75 (BLADE) ×2 IMPLANT
CAPT HIP PF MOP ×2 IMPLANT
CELLS DAT CNTRL 66122 CELL SVR (MISCELLANEOUS) ×1 IMPLANT
CLOTH BEACON ORANGE TIMEOUT ST (SAFETY) ×2 IMPLANT
DERMABOND ADVANCED (GAUZE/BANDAGES/DRESSINGS)
DERMABOND ADVANCED .7 DNX12 (GAUZE/BANDAGES/DRESSINGS) IMPLANT
DRAPE C-ARM 42X120 X-RAY (DRAPES) ×2 IMPLANT
DRAPE STERI IOBAN 125X83 (DRAPES) ×2 IMPLANT
DRAPE U-SHAPE 47X51 STRL (DRAPES) ×6 IMPLANT
DRSG AQUACEL AG ADV 3.5X10 (GAUZE/BANDAGES/DRESSINGS) IMPLANT
DURAPREP 26ML APPLICATOR (WOUND CARE) ×2 IMPLANT
ELECT BLADE TIP CTD 4 INCH (ELECTRODE) ×2 IMPLANT
ELECT REM PT RETURN 9FT ADLT (ELECTROSURGICAL) ×2
ELECTRODE REM PT RTRN 9FT ADLT (ELECTROSURGICAL) ×1 IMPLANT
FACESHIELD LNG OPTICON STERILE (SAFETY) ×8 IMPLANT
GLOVE BIO SURGEON STRL SZ7.5 (GLOVE) ×2 IMPLANT
GLOVE BIOGEL PI IND STRL 8 (GLOVE) ×2 IMPLANT
GLOVE BIOGEL PI INDICATOR 8 (GLOVE) ×2
GLOVE ECLIPSE 8.0 STRL XLNG CF (GLOVE) ×4 IMPLANT
GOWN STRL REIN XL XLG (GOWN DISPOSABLE) ×4 IMPLANT
HANDPIECE INTERPULSE COAX TIP (DISPOSABLE) ×1
KIT BASIN OR (CUSTOM PROCEDURE TRAY) ×2 IMPLANT
PACK TOTAL JOINT (CUSTOM PROCEDURE TRAY) ×2 IMPLANT
PADDING CAST COTTON 6X4 STRL (CAST SUPPLIES) IMPLANT
RTRCTR WOUND ALEXIS 18CM MED (MISCELLANEOUS) ×2
SET HNDPC FAN SPRY TIP SCT (DISPOSABLE) ×1 IMPLANT
SUT ETHIBOND NAB CT1 #1 30IN (SUTURE) ×2 IMPLANT
SUT ETHILON 3 0 PS 1 (SUTURE) ×2 IMPLANT
SUT MNCRL AB 4-0 PS2 18 (SUTURE) ×2 IMPLANT
SUT VIC AB 0 CT1 36 (SUTURE) ×2 IMPLANT
SUT VIC AB 1 CT1 36 (SUTURE) ×4 IMPLANT
SUT VIC AB 2-0 CT1 27 (SUTURE) ×2
SUT VIC AB 2-0 CT1 TAPERPNT 27 (SUTURE) ×2 IMPLANT
TOWEL OR 17X26 10 PK STRL BLUE (TOWEL DISPOSABLE) ×2 IMPLANT
TOWEL OR NON WOVEN STRL DISP B (DISPOSABLE) ×2 IMPLANT
TRAY FOLEY CATH 14FRSI W/METER (CATHETERS) ×2 IMPLANT

## 2012-09-07 NOTE — Anesthesia Procedure Notes (Addendum)
Spinal   Spinal  Patient location during procedure: OR Staffing Anesthesiologist: Phillips Grout Performed by: anesthesiologist  Preanesthetic Checklist Completed: patient identified, site marked, surgical consent, pre-op evaluation, timeout performed, IV checked, risks and benefits discussed and monitors and equipment checked Spinal Block Patient position: sitting Prep: Betadine Patient monitoring: heart rate, continuous pulse ox and blood pressure Approach: right paramedian Location: L2-3 Injection technique: single-shot Needle Needle type: Spinocan  Needle gauge: 22 G Needle length: 9 cm Additional Notes Expiration date of kit checked and confirmed. Patient tolerated procedure well, without complications.

## 2012-09-07 NOTE — Anesthesia Preprocedure Evaluation (Signed)
Anesthesia Evaluation  Patient identified by MRN, date of birth, ID band Patient awake    Reviewed: Allergy & Precautions, H&P , NPO status , Patient's Chart, lab work & pertinent test results, reviewed documented beta blocker date and time   Airway Mallampati: II TM Distance: >3 FB Neck ROM: Full    Dental  (+) Teeth Intact and Dental Advisory Given   Pulmonary COPD COPD inhaler,  breath sounds clear to auscultation        Cardiovascular + CAD Rhythm:Regular Rate:Normal  CAD, s/p PTCA w/ stent 2008 Currently asymptomatic left main had  a 20% stenosis.  Left anterior descending had 40-50% proximal   stenosis.  Left circumflex had an 80% proximal stenosis.  The  first obtuse marginal artery had a 60% mid stenosis.  The  right  coronary artery  had  a 50-60% proximal stenosis with a 60-70%  mid stenosis  The patient was managed medically.     Neuro/Psych negative neurological ROS  negative psych ROS   GI/Hepatic negative GI ROS, Neg liver ROS, GERD-  Medicated,  Endo/Other  Hypothyroidism Thyroid replacement  Renal/GU negative Renal ROS  negative genitourinary   Musculoskeletal   Abdominal   Peds negative pediatric ROS (+)  Hematology negative hematology ROS (+)   Anesthesia Other Findings   Reproductive/Obstetrics negative OB ROS                           Anesthesia Physical  Anesthesia Plan  ASA: III  Anesthesia Plan: Spinal   Post-op Pain Management:    Induction: Intravenous  Airway Management Planned: Mask  Additional Equipment:   Intra-op Plan:   Post-operative Plan:   Informed Consent: I have reviewed the patients History and Physical, chart, labs and discussed the procedure including the risks, benefits and alternatives for the proposed anesthesia with the patient or authorized representative who has indicated his/her understanding and acceptance.   Dental advisory  given  Plan Discussed with: CRNA and Surgeon  Anesthesia Plan Comments:         Anesthesia Quick Evaluation

## 2012-09-07 NOTE — Anesthesia Postprocedure Evaluation (Signed)
  Anesthesia Post-op Note  Patient: [name]  Procedure(s) Performed: Procedure(s) (LRB): RIGHT TOTAL HIP ARTHROPLASTY ANTERIOR APPROACH (Right)  Patient Location: PACU  Anesthesia Type: Spinal  Level of Consciousness: awake and alert   Airway and Oxygen Therapy: Patient Spontanous Breathing  Post-op Pain: mild  Post-op Assessment: Post-op Vital signs reviewed, Patient's Cardiovascular Status Stable, Respiratory Function Stable, Patent Airway and No signs of Nausea or vomiting  Last Vitals:  Filed Vitals:   09/07/12 1815  BP: 111/64  Pulse: 73  Temp:   Resp: 18    Post-op Vital Signs: stable   Complications: No apparent anesthesia complications

## 2012-09-07 NOTE — Brief Op Note (Signed)
09/07/2012  5:33 PM  PATIENT:  [name]  79 y.o. male  PRE-OPERATIVE DIAGNOSIS:  Severe osteoarthritis right hip  POST-OPERATIVE DIAGNOSIS:  Severe osteoarthritis right hip  PROCEDURE:  Procedure(s): RIGHT TOTAL HIP ARTHROPLASTY ANTERIOR APPROACH (Right)  SURGEON:  Surgeon(s) and Role:    * Kathryne Hitch, MD - Primary  PHYSICIAN ASSISTANT: Rexene Edison, PA-C   ANESTHESIA:   spinal  EBL:  Total I/O In: 2000 [I.V.:2000] Out: 725 [Urine:300; Blood:425]  BLOOD ADMINISTERED:none  DRAINS: none   LOCAL MEDICATIONS USED:  NONE  SPECIMEN:  No Specimen  DISPOSITION OF SPECIMEN:  N/A  COUNTS:  YES  TOURNIQUET:  * No tourniquets in log *  DICTATION: .Other Dictation: Dictation Number [id number]  PLAN OF CARE: Admit to inpatient   PATIENT DISPOSITION:  PACU - hemodynamically stable.   Delay start of Pharmacological VTE agent (>24hrs) due to surgical blood loss or risk of bleeding: no

## 2012-09-07 NOTE — Transfer of Care (Signed)
Immediate Anesthesia Transfer of Care Note  Patient: [name]  Procedure(s) Performed: Procedure(s): RIGHT TOTAL HIP ARTHROPLASTY ANTERIOR APPROACH (Right)  Patient Location: PACU  Anesthesia Type:Spinal  Level of Consciousness: awake, alert , oriented and patient cooperative  Airway & Oxygen Therapy: Patient Spontanous Breathing and Patient connected to face mask oxygen  Post-op Assessment: Report given to PACU RN and Post -op Vital signs reviewed and stable  Post vital signs: stable  Complications: No apparent anesthesia complications spinal level  T10

## 2012-09-07 NOTE — Plan of Care (Signed)
Problem: Consults Goal: Diagnosis- Total Joint Replacement Right anterior total hip     

## 2012-09-07 NOTE — H&P (Signed)
TOTAL HIP ADMISSION H&P  Patient is admitted for right total hip arthroplasty.  Subjective:  Chief Complaint: right hip pain  HPI: [name], 79 y.o. male, has a history of pain and functional disability in the right hip(s) due to arthritis and patient has failed non-surgical conservative treatments for greater than 12 weeks to include NSAID's and/or analgesics, use of assistive devices and activity modification.  Onset of symptoms was gradual starting 2 years ago with gradually worsening course since that time.The patient noted no past surgery on the right hip(s).  Patient currently rates pain in the right hip at 8 out of 10 with activity. Patient has night pain, worsening of pain with activity and weight bearing, trendelenberg gait, pain that interfers with activities of daily living and pain with passive range of motion. Patient has evidence of subchondral cysts, subchondral sclerosis, periarticular osteophytes and joint space narrowing by imaging studies. This condition presents safety issues increasing the risk of falls..  There is no current active infection.  Patient Active Problem List   Diagnosis Date Noted  . Arthritis pain of hip 09/07/2012  . S/P left THA, AA 07/21/2011  . Preoperative clearance 07/13/2011  . Special screening for malignant neoplasms, colon 01/03/2011  . Personal history of colonic polyps 01/03/2011  . COPD (chronic obstructive pulmonary disease) 08/24/2010  . GI BLEED 11/17/2009  . ESOPHAGEAL REFLUX 08/25/2009  . BLOOD IN STOOL-MELENA 08/25/2009  . COLONIC POLYPS 07/17/2008  . ANEMIA 07/17/2008  . HYPOTENSION 07/17/2008  . ESOPHAGITIS 07/17/2008  . DIVERTICULOSIS, MILD 07/17/2008  . DEGENERATIVE JOINT DISEASE 07/17/2008  . OSTEOPOROSIS 07/17/2008  . BENIGN PROSTATIC HYPERTROPHY, HX OF 07/17/2008  . HYPERPROLACTINEMIA 10/24/2007  . OTHER SPECIFIED HYPOTENSION 10/24/2007  . PITUITARY ADENOMA 06/25/2007  . HYPOTHYROIDISM 06/25/2007  . HYPERLIPIDEMIA  06/22/2007  . CORONARY ARTERY DISEASE 06/22/2007   Past Medical History  Diagnosis Date  . Emphysema   . CAD (coronary artery disease)     left main had  a 20% stenosis.  Left anterior descending had 40-50% proximal   stenosis.  Left circumflex had an 80% proximal stenosis.  The  first obtuse marginal artery had a 60% mid stenosis.  The  right  coronary artery  had  a 50-60% proximal stenosis with a 60-70%  mid stenosis  The patient was managed medically.   Marland Kitchen COPD (chronic obstructive pulmonary disease)   . Hypothyroidism   . Anemia   . Blood transfusion june 2011, 5 units given    july 2011 some units given  . Enlarged prostate     elevated psa recently  . GERD (gastroesophageal reflux disease)   . Arthritis   . Complication of anesthesia     likes spinal due to copd  . Dizziness     occasional  . Shortness of breath     with exertion    Past Surgical History  Procedure Laterality Date  . Coronary stent placement  5 yrs ago  . Hiatal hernia repair  01-04-2008  . Foot surgery  1994 left, 2002 right foot    bilateral foot reconstruciton  . Abdominal hernia repair   2008  . Cataract extraction both eyes    . Total hip arthroplasty  07/21/2011    Procedure: TOTAL HIP ARTHROPLASTY ANTERIOR APPROACH;  Surgeon: [name], MD;  Location: [facility];  Service: Orthopedics;  Laterality: Left;  . Coronary angioplasty      Prescriptions prior to admission  Medication Sig Dispense Refill  . aspirin EC 81  MG tablet Take 81 mg by mouth daily.      . bromocriptine (PARLODEL) 2.5 MG tablet Take 2.5 mg by mouth at bedtime.      . budesonide-formoterol (SYMBICORT) 160-4.5 MCG/ACT inhaler Inhale 2 puffs into the lungs 2 (two) times daily.      . cholecalciferol (VITAMIN D) 1000 UNITS tablet Take 1,000 Units by mouth 2 (two) times daily.      . ferrous sulfate 325 (65 FE) MG tablet Take 325 mg by mouth daily with breakfast.      . guaiFENesin (MUCINEX) 600 MG 12 hr tablet Take 600 mg by mouth  daily.       . hyoscyamine (LEVSIN SL) 0.125 MG SL tablet Place 0.125 mg under the tongue every 4 (four) hours as needed. For stomach cramps      . levothyroxine (SYNTHROID, LEVOTHROID) 50 MCG tablet Take 50 mcg by mouth at bedtime.      . montelukast (SINGULAIR) 10 MG tablet Take 10 mg by mouth daily as needed. For allergies      . Multiple Vitamin (MULITIVITAMIN WITH MINERALS) TABS Take 1 tablet by mouth every evening.      . pantoprazole (PROTONIX) 40 MG tablet Take 40 mg by mouth daily with breakfast.      . albuterol (PROAIR HFA) 108 (90 BASE) MCG/ACT inhaler Inhale 2 puffs into the lungs every 6 (six) hours as needed for wheezing.      Marland Kitchen doxycycline (VIBRA-TABS) 100 MG tablet Take 100 mg by mouth as needed.      . methocarbamol (ROBAXIN) 500 MG tablet Take 500 mg by mouth 4 (four) times daily as needed (pain).      . nitroGLYCERIN (NITROSTAT) 0.4 MG SL tablet Place 1 tablet (0.4 mg total) under the tongue every 5 (five) minutes as needed.  25 tablet  11  . pravastatin (PRAVACHOL) 40 MG tablet Take 20 mg by mouth every other day.       . traMADol (ULTRAM) 50 MG tablet Take 50 mg by mouth every 6 (six) hours as needed for pain.       Allergies  Allergen Reactions  . Lortab (Hydrocodone-Acetaminophen) Nausea And Vomiting  . Penicillins Other (See Comments)    Light headed  . Zetia (Ezetimibe)     Muscle weakness  . Zocor (Simvastatin) Other (See Comments)    Nausea, muscle weakness    History  Substance Use Topics  . Smoking status: Never Smoker   . Smokeless tobacco: Never Used  . Alcohol Use: No    Family History  Problem Relation Age of Onset  . Heart disease Sister   . Heart failure Father   . Colon cancer Neg Hx      Review of Systems  Musculoskeletal: Positive for joint pain.  All other systems reviewed and are negative.    Objective:  Physical Exam  Constitutional: He is oriented to person, place, and time. He appears well-developed and well-nourished.  HENT:   Head: Normocephalic and atraumatic.  Eyes: EOM are normal. Pupils are equal, round, and reactive to light.  Neck: Normal range of motion. Neck supple.  Cardiovascular: Normal rate and regular rhythm.   Respiratory: Effort normal and breath sounds normal.  GI: Soft. Bowel sounds are normal.  Musculoskeletal:       Right hip: He exhibits decreased strength and bony tenderness.  Neurological: He is alert and oriented to person, place, and time.  Skin: Skin is warm and dry.  Psychiatric: He has a  normal mood and affect.    Vital signs in last 24 hours: Temp:  [97.5 F (36.4 C)] 97.5 F (36.4 C) (07/11 1104) Pulse Rate:  [55] 55 (07/11 1104) Resp:  [18] 18 (07/11 1104) BP: (126)/(78) 126/78 mmHg (07/11 1104) SpO2:  [99 %] 99 % (07/11 1104)  Labs:   Estimated body mass index is 25.99 kg/(m^2) as calculated from the following:   Height as of 09/03/12: 5\' 7"  (1.702 m).   Weight as of 11/09/11: 75.297 kg (166 lb).   Imaging Review Plain radiographs demonstrate severe degenerative joint disease of the right hip(s). The bone quality appears to be good for age and reported activity level.  Assessment/Plan:  End stage arthritis, right hip(s)  The patient history, physical examination, clinical judgement of the provider and imaging studies are consistent with end stage degenerative joint disease of the right hip(s) and total hip arthroplasty is deemed medically necessary. The treatment options including medical management, injection therapy, arthroscopy and arthroplasty were discussed at length. The risks and benefits of total hip arthroplasty were presented and reviewed. The risks due to aseptic loosening, infection, stiffness, dislocation/subluxation,  thromboembolic complications and other imponderables were discussed.  The patient acknowledged the explanation, agreed to proceed with the plan and consent was signed. Patient is being admitted for inpatient treatment for surgery, pain control,  PT, OT, prophylactic antibiotics, VTE prophylaxis, progressive ambulation and ADL's and discharge planning.The patient is planning to be discharged home with home health services

## 2012-09-08 LAB — CBC
HCT: 28.6 % — ABNORMAL LOW (ref 39.0–52.0)
Hemoglobin: 9.7 g/dL — ABNORMAL LOW (ref 13.0–17.0)
MCH: 31.9 pg (ref 26.0–34.0)
MCHC: 33.9 g/dL (ref 30.0–36.0)
MCV: 94.1 fL (ref 78.0–100.0)
Platelets: 179 10*3/uL (ref 150–400)
RBC: 3.04 MIL/uL — ABNORMAL LOW (ref 4.22–5.81)
RDW: 14.3 % (ref 11.5–15.5)
WBC: 13.1 10*3/uL — ABNORMAL HIGH (ref 4.0–10.5)

## 2012-09-08 LAB — BASIC METABOLIC PANEL
BUN: 11 mg/dL (ref 6–23)
CO2: 25 mEq/L (ref 19–32)
Calcium: 8.1 mg/dL — ABNORMAL LOW (ref 8.4–10.5)
Chloride: 102 mEq/L (ref 96–112)
Creatinine, Ser: 0.93 mg/dL (ref 0.50–1.35)
GFR calc Af Amer: >90 mL/min (ref >90)
GFR calc non Af Amer: 78 mL/min — ABNORMAL LOW (ref >90)
Glucose, Bld: 173 mg/dL — ABNORMAL HIGH (ref 70–99)
Potassium: 4.3 mEq/L (ref 3.5–5.1)
Sodium: 135 mEq/L (ref 135–145)

## 2012-09-08 NOTE — Progress Notes (Addendum)
Subjective: 1 Day Post-Op Procedure(s) (LRB): RIGHT TOTAL HIP ARTHROPLASTY ANTERIOR APPROACH (Right) Patient reports pain as mild.    Objective: Vital signs in last 24 hours: Temp:  [97.4 F (36.3 C)-98.7 F (37.1 C)] 98.6 F (37 C) (07/12 0511) Pulse Rate:  [55-96] 91 (07/12 0511) Resp:  [16-20] 20 (07/12 0758) BP: (95-126)/(59-79) 103/69 mmHg (07/12 0511) SpO2:  [95 %-100 %] 95 % (07/12 0904) Weight:  [71.668 kg (158 lb)] 71.668 kg (158 lb) (07/11 2047)  Intake/Output from previous day: 07/11 0701 - 07/12 0700 In: 3900 [I.V.:3750; IV Piggyback:150] Out: 1300 [Urine:875; Blood:425] Intake/Output this shift: Total I/O In: 100 [P.O.:100] Out: -    Recent Labs  09/08/12 0521  HGB 9.7*    Recent Labs  09/08/12 0521  WBC 13.1*  RBC 3.04*  HCT 28.6*  PLT 179    Recent Labs  09/08/12 0521  NA 135  K 4.3  CL 102  CO2 25  BUN 11  CREATININE 0.93  GLUCOSE 173*  CALCIUM 8.1*   No results found for this basename: LABPT, INR,  in the last 72 hours  Neurologically intact  Assessment/Plan: 1 Day Post-Op Procedure(s) (LRB): RIGHT TOTAL HIP ARTHROPLASTY ANTERIOR APPROACH (Right) Up with therapy  Had NandV after pain meds.   Tiauna Whisnant C 09/08/2012, 9:16 AM

## 2012-09-08 NOTE — Evaluation (Signed)
Physical Therapy Evaluation Patient Details Name: [name] MRN: [id number] DOB: 1933/01/04 Today's Date: 09/08/2012 Time: 4098-1191 PT Time Calculation (min): 35 min  PT Assessment / Plan / Recommendation History of Present Illness     Clinical Impression  Pt s/p R THR presents with decreased R LE strength/ROM, post op pain and premorbid ambulatory  instability limiting functional mobility.  Pt plans d/c home with HHPT follow up and assist of three grandchildren.    PT Assessment  Patient needs continued PT services    Follow Up Recommendations  Home health PT    Does the patient have the potential to tolerate intense rehabilitation      Barriers to Discharge        Equipment Recommendations  None recommended by PT    Recommendations for Other Services OT consult   Frequency 7X/week    Precautions / Restrictions Precautions Precautions: Fall Precaution Comments: Pt with hx of falls at home.  States his feet Caide't keep up with his mind Restrictions Weight Bearing Restrictions: No Other Position/Activity Restrictions: WBAT   Pertinent Vitals/Pain 3/10; premed, ice pack provided      Mobility  Bed Mobility Bed Mobility: Supine to Sit Supine to Sit: 3: Mod assist Details for Bed Mobility Assistance: cues for sequence and roll to L side - assist with R LE and to bring trunk upright Transfers Transfers: Sit to Stand;Stand to Sit Sit to Stand: 1: +2 Total assist Sit to Stand: Patient Percentage: 70% Stand to Sit: 1: +2 Total assist;To chair/3-in-1;With armrests Stand to Sit: Patient Percentage: 70% Details for Transfer Assistance: cues for LE management and use of UEs to self assist Ambulation/Gait Ambulation/Gait Assistance: 1: +2 Total assist Ambulation/Gait: Patient Percentage: 70% Ambulation Distance (Feet): 9 Feet Assistive device: Rolling walker Ambulation/Gait Assistance Details: cues for posture, sequence, position from RW and stride length Gait  Pattern: Step-to pattern;Decreased step length - right;Decreased step length - left General Gait Details: ltd by onset of nausea    Exercises Total Joint Exercises Ankle Circles/Pumps: AROM;Supine;Both;15 reps Quad Sets: AROM;Both;10 reps;Supine Heel Slides: AAROM;15 reps;Supine;Right Hip ABduction/ADduction: AAROM;Right;15 reps;Supine   PT Diagnosis: Difficulty walking  PT Problem List: Decreased strength;Decreased range of motion;Decreased activity tolerance;Decreased balance;Decreased mobility;Decreased knowledge of use of DME;Pain;Obesity PT Treatment Interventions: DME instruction;Gait training;Functional mobility training;Therapeutic activities;Therapeutic exercise;Patient/family education     PT Goals(Current goals can be found in the care plan section) Acute Rehab PT Goals Patient Stated Goal: Home to resume previous lifestyle with decreased pain PT Goal Formulation: With patient Potential to Achieve Goals: Good  Visit Information  Last PT Received On: 09/08/12 Assistance Needed: +2       Prior Functioning  Home Living Family/patient expects to be discharged to:: Private residence Living Arrangements: Spouse/significant other Available Help at Discharge: Family Type of Home: House Home Access: Ramped entrance Home Layout: One level Home Equipment: Environmental consultant - 2 wheels Prior Function Level of Independence: Independent with assistive device(s) Comments: Pt with hx of falls Communication Communication: No difficulties    Cognition  Cognition Arousal/Alertness: Awake/alert Behavior During Therapy: WFL for tasks assessed/performed Overall Cognitive Status: Within Functional Limits for tasks assessed    Extremity/Trunk Assessment Upper Extremity Assessment Upper Extremity Assessment: Overall WFL for tasks assessed Lower Extremity Assessment Lower Extremity Assessment: RLE deficits/detail RLE Deficits / Details: hip strength 2+/5 with AAROM at hip to 90 flex and 20  abd   Balance    End of Session PT - End of Session Equipment Utilized During Treatment: Gait belt  Activity Tolerance: Patient tolerated treatment well Patient left: in chair;with call bell/phone within reach;with family/visitor present Nurse Communication: Mobility status  GP     Raphel Stickles 09/08/2012, 2:20 PM

## 2012-09-08 NOTE — Progress Notes (Signed)
Physical Therapy Treatment Patient Details Name: KUTTER SCHNEPF MRN: [id number] DOB: 10-27-1932 Today's Date: 09/08/2012 Time: 4098-1191 PT Time Calculation (min): 24 min  PT Assessment / Plan / Recommendation  PT Comments     Follow Up Recommendations  Home health PT     Does the patient have the potential to tolerate intense rehabilitation     Barriers to Discharge        Equipment Recommendations  None recommended by PT    Recommendations for Other Services OT consult  Frequency 7X/week   Progress towards PT Goals Progress towards PT goals: Progressing toward goals  Plan Current plan remains appropriate    Precautions / Restrictions Precautions Precautions: Fall Precaution Comments: Pt with hx of falls at home.  States his feet Verdie't keep up with his mind Restrictions Weight Bearing Restrictions: No Other Position/Activity Restrictions: WBAT   Pertinent Vitals/Pain 4/10; premed; RN providing MEds for nausea    Mobility  Bed Mobility Bed Mobility: Not assessed Sit to Supine: 3: Mod assist Details for Bed Mobility Assistance: cues for sequence and use of L LE to self assist Transfers Transfers: Sit to Stand;Stand to Sit Sit to Stand: 4: Min assist;With upper extremity assist;From chair/3-in-1 Stand to Sit: 4: Min assist;With upper extremity assist;To chair/3-in-1 Details for Transfer Assistance: cues for hand placement Ambulation/Gait Ambulation/Gait Assistance: 4: Min assist;3: Mod assist Ambulation Distance (Feet): 27 Feet Assistive device: Rolling walker Ambulation/Gait Assistance Details: cues for posture, stride length, position from RW Gait Pattern: Step-to pattern;Decreased step length - right;Decreased step length - left General Gait Details: ltd by onset of nausea    Exercises     PT Diagnosis:    PT Problem List:   PT Treatment Interventions:     PT Goals (current goals can now be found in the care plan section) Acute Rehab PT Goals Patient  Stated Goal: Return home and regain independence PT Goal Formulation: With patient Potential to Achieve Goals: Good  Visit Information  Last PT Received On: 09/08/12 Assistance Needed: +1 History of Present Illness: 79 y.o. male with h/o Lt. anterior THA, admitted for  Rt. THA direct anterior approach    Subjective Data  Subjective: I think that was far enough, I am getting nauseous Patient Stated Goal: Return home and regain independence   Cognition  Cognition Arousal/Alertness: Awake/alert Behavior During Therapy: WFL for tasks assessed/performed Overall Cognitive Status: Within Functional Limits for tasks assessed    Balance     End of Session PT - End of Session Equipment Utilized During Treatment: Gait belt Activity Tolerance: Other (comment) (N&V) Patient left: in chair;with call bell/phone within reach;with family/visitor present Nurse Communication: Mobility status;Other (comment) (nausea)   GP     Jamelle Goldston 09/08/2012, 4:52 PM

## 2012-09-08 NOTE — Evaluation (Signed)
Occupational Therapy Evaluation Patient Details Name: [name] MRN: [id number] DOB: [birth year] Today's Date: 09/08/2012 Time: 4098-1191 OT Time Calculation (min): 40 min  OT Assessment / Plan / Recommendation History of present illness 79 y.o. male with h/o Lt. anterior THA, admitted for  Rt. THA direct anterior approach   Clinical Impression   Pt is s/p THA resulting in the deficits listed below (see OT Problem List). 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  Pt will benefit from skilled OT to increase their safety and independence with ADL and functional mobility for ADL to facilitate discharge to venue listed below.  Pt will likely need to use AE to allow him to regain independence with BADLs     OT Assessment  Patient needs continued OT Services    Follow Up Recommendations  Home health OT;Supervision/Assistance - 24 hour    Barriers to Discharge      Equipment Recommendations  None recommended by OT    Recommendations for Other Services    Frequency  Min 2X/week    Precautions / Restrictions Precautions Precautions: Fall Precaution Comments: Pt with hx of falls at home.  States his feet Yassine't keep up with his mind Restrictions Weight Bearing Restrictions: No Other Position/Activity Restrictions: WBAT   Pertinent Vitals/Pain     ADL  Eating/Feeding: Independent Where Assessed - Eating/Feeding: Chair Grooming: Wash/dry hands;Wash/dry face;Teeth care;Brushing hair;Set up Where Assessed - Grooming: Supported sitting Upper Body Bathing: Set up Where Assessed - Upper Body Bathing: Supported sitting Lower Body Bathing: Moderate assistance Where Assessed - Lower Body Bathing: Supported sit to stand Upper Body Dressing: Set up Where Assessed - Upper Body Dressing: Unsupported sitting Lower Body Dressing: Maximal assistance Where Assessed - Lower Body Dressing:  Supported sit to stand Toilet Transfer: Minimal assistance Toilet Transfer Method: Sit to stand;Stand pivot Toilet Transfer Equipment: Raised toilet seat with arms (or 3-in-1 over toilet);Bedside commode Toileting - Clothing Manipulation and Hygiene: Moderate assistance Where Assessed - Toileting Clothing Manipulation and Hygiene: Standing Equipment Used: Rolling walker Transfers/Ambulation Related to ADLs: min a ADL Comments: Pt unable to access feet, only able to reach just past knees.  Anticipate that pt may need AE for LB ADLs - he reports he has at home    OT Diagnosis: Generalized weakness;Acute pain  OT Problem List: Decreased strength;Decreased activity tolerance;Decreased knowledge of use of DME or AE;Pain OT Treatment Interventions: Self-care/ADL training;DME and/or AE instruction;Therapeutic activities;Patient/family education   OT Goals(Current goals can be found in the care plan section) Acute Rehab OT Goals Patient Stated Goal: Return home and regain independence OT Goal Formulation: With patient/family Time For Goal Achievement: 09/15/12 Potential to Achieve Goals: Good ADL Goals Pt Will Perform Grooming: with supervision;standing Pt Will Perform Lower Body Bathing: with supervision;sit to/from stand;with adaptive equipment Pt Will Perform Lower Body Dressing: with supervision;sit to/from stand;with adaptive equipment Pt Will Transfer to Toilet: with supervision;regular height toilet;bedside commode;ambulating Pt Will Perform Toileting - Clothing Manipulation and hygiene: with supervision;sit to/from stand Pt Will Perform Tub/Shower Transfer: Tub transfer;Shower transfer;with supervision;rolling walker;shower seat;ambulating  Visit Information  Last OT Received On: 09/08/12 Assistance Needed: +1 History of Present Illness: 79 y.o. male with h/o Lt. anterior THA, admitted for  Rt. 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  However, he has 3 grandsons that will assist at needed Prior Function Level of Independence: Independent with assistive device(s) Comments: Pt with hx of falls - the last was 3 wks ago, and 2 not long before that Communication Communication: No difficulties         Vision/Perception     Cognition  Cognition Arousal/Alertness: Awake/alert Behavior During Therapy: WFL for tasks assessed/performed Overall Cognitive Status: Within Functional Limits for tasks assessed    Extremity/Trunk Assessment Upper Extremity Assessment Upper Extremity Assessment: Overall WFL for tasks assessed Lower Extremity Assessment Lower Extremity Assessment: Defer to PT evaluation RLE Deficits / Details: hip strength 2+/5 with AAROM at hip to 90 flex and 20 abd     Mobility Bed Mobility Bed Mobility: Not assessed Supine to Sit: 3: Mod assist Details for Bed Mobility Assistance: cues for sequence and roll to L side - assist with R LE and to bring trunk upright Transfers Transfers: Sit to Stand;Stand to Sit Sit to Stand: 4: Min assist;With upper extremity assist;From chair/3-in-1 Sit to Stand: Patient Percentage: 70% Stand to Sit: 4: Min assist;With upper extremity assist;To chair/3-in-1 Stand to Sit: Patient Percentage: 70% Details for Transfer Assistance: cues for hand placement     Exercise Total Joint Exercises Ankle Circles/Pumps: AROM;Supine;Both;15 reps Quad Sets: AROM;Both;10 reps;Supine Heel Slides: AAROM;15 reps;Supine;Right Hip ABduction/ADduction: AAROM;Right;15 reps;Supine   Balance     End of  Session OT - End of Session Equipment Utilized During Treatment: Rolling walker Activity Tolerance: Patient tolerated treatment well Patient left: in chair;with call bell/phone within reach;with family/visitor present  GO     Latreshia Beauchaine, Ursula Alert M 09/08/2012, 3:32 PM

## 2012-09-08 NOTE — Op Note (Signed)
NAMEBRYLER, [name] NO.:  [id number]  MEDICAL RECORD NO.:  [id number]  LOCATION:  1602                         FACILITY:  [facility]  PHYSICIAN:  Vanita Panda. Magnus Ivan, M.D.DATE OF BIRTH:  20-Aug-1932  DATE OF PROCEDURE:  09/07/2012 DATE OF DISCHARGE:                              OPERATIVE REPORT   PREOPERATIVE DIAGNOSES:  Severe end-stage arthritis and degenerative joint disease, right hip.  POSTOPERATIVE DIAGNOSES:  Severe end-stage arthritis and degenerative joint disease, right hip.  PROCEDURE:  Right total hip arthroplasty direct anterior approach.  IMPLANTS:  DePuy Sector Gription acetabular component size 34, size 36+ 4 neutral polyethylene liner, size 7 Tri-Lock femoral component with high offset, size 36- 2 metal hip ball.  SURGEON:  Vanita Panda. Magnus Ivan, M.D.  ASSISTANT:  Richardean Canal, P.A.  ANESTHESIA:  Spinal.  ANTIBIOTICS:  1 g of vancomycin.  BLOOD LOSS:  500 mL.  COMPLICATIONS:  None.  INDICATIONS:  [name] is a 79 year old gentleman with severe end- stage arthritis involving his right hip.  A little over a year ago, he underwent a successful left total hip arthroplasty, now presents to have the right one done.  He understands fully the risks and benefits of surgery.  He has daily pain with severe end-stage arthritis of his hip with decreased mobility.  Given the pain he is having, he wishes to proceed with a direct anterior hip replacement.  PROCEDURE DESCRIPTION:  After informed consent was obtained, appropriate right hip was marked.  He was brought to the operating room and set up on a stretcher so that a spinal anesthesia could be obtained.  He was then laid in a supine position.  A Foley catheter was placed and then he had traction boots applied to them.  He was next placed supine on the HANA fracture table with the perineal post in place and both legs in inline skeletal traction.  We prepped his operative right hip  with DuraPrep and sterile drapes.  Time-out was called, he was identified as the correct patient, correct right hip.  We then made an incision posterior and inferior to the anterior superior iliac spine and carried this obliquely down the leg.  I dissected down to the tensor fascia lata muscle and then the tensor fascia was divided longitudinally.  I then proceeded with a direct anterior approach to the hip.  A Cobra retractor was placed around the lateral neck and up underneath the rectus femoris, a medial cobra retractor was placed.  I cauterized the lateral femoral circumflex vessels.  I then opened the hip capsule in a L-type format and found a large hip effusion.  I then placed the Cobra retractors within the hip capsule.  I made my femoral neck cut just proximal to the lesser trochanter using an oscillating saw and completed this with an osteotome.  I used a corkscrew guide to remove the femoral head, and then passed off the femoral head.  I cleaned the acetabulum debris and placed a bent Hohmann medially and a Cobra retractor laterally and cleaned remnants of acetabular labrum.  I then began reaming from a size 42 reamer in 2 mm increments all the way up to size 54  with all reamers placed under direct visualization and last 2 reamers also placed under direct fluoroscopy so we obtained our depth at reamer inclination and anteversion.  Once I was pleased with this, I placed a real DePuy Sector Gription acetabular component size 54, and the real 36+ 4 neutral polyethylene liner.  Attention was then turned to the femur.  With the leg externally rotated to 90 degrees, extended and adducted, I used a medial retractor medially and Hohmann retractor behind the greater trochanter and released the lateral joint capsule.  I then used a box cutting osteotome down the femoral canal and a rongeur to lateralize.  I began broaching using the trial like broaching system from a size 0 up to a size  7.  The size 7 was a tight fit.  It was loose at first and I tried an 8, but then it became a tight fit when I was countersunk and used the calcar planer.  I then trialed the offset neck with a 36- 2 hip ball because it looked long, reduced this in acetabulum, it was stable with internal rotation with minimal shuck and his leg-lengths were measured and near equal under direct fluoroscopy.  I then dislocated the hip and removed the trial components.  We placed the real Tri-Lock size 7 with high offset followed by the real 36- 2 metal hip ball and reduced it back into the acetabulum stable.  We then copiously irrigated the soft tissue with normal saline solution using pulsatile lavage, and cauterized the bleeding vessels with electrocautery.  We closed the hip capsule with interrupted #1 Ethibond suture followed by a running #1 Vicryl in the tensor fascia, 0-Vicryl in the deep tissue, 2-0 Vicryl in the subcutaneous tissue, 4-0 Monocryl subcuticular stitch, and Dermabond on the skin.  A well-padded sterile dressing was applied.  He was then taken off the HANA table in the recovery room in stable condition.  All final counts were correct.  There were no complications noted.  Of note, Allen Basque, PA-C was present during the entire case.  His presence was crucial for retraction, implant positioning, even traction on the table, and closure of the wound.     Vanita Panda. Magnus Ivan, M.D.     CYB/MEDQ  D:  09/07/2012  T:  09/08/2012  Job:  [id number]

## 2012-09-09 LAB — CBC
HCT: 22.5 % — ABNORMAL LOW (ref 39.0–52.0)
Hemoglobin: 7.9 g/dL — ABNORMAL LOW (ref 13.0–17.0)
MCH: 32.6 pg (ref 26.0–34.0)
MCHC: 35.1 g/dL (ref 30.0–36.0)
MCV: 93 fL (ref 78.0–100.0)
Platelets: 132 10*3/uL — ABNORMAL LOW (ref 150–400)
RBC: 2.42 MIL/uL — ABNORMAL LOW (ref 4.22–5.81)
RDW: 14.3 % (ref 11.5–15.5)
WBC: 10.2 10*3/uL (ref 4.0–10.5)

## 2012-09-09 LAB — PREPARE RBC (CROSSMATCH)

## 2012-09-09 MED ORDER — FUROSEMIDE 10 MG/ML IJ SOLN
20.0000 mg | Freq: Once | INTRAMUSCULAR | Status: DC
  Filled 2012-09-09: qty 2

## 2012-09-09 NOTE — Progress Notes (Signed)
Occupational Therapy Treatment Patient Details Name: TALEN POSER MRN: [id number] DOB: [birth year] Today's Date: 09/09/2012 Time: 4098-1191 OT Time Calculation (min): 23 min  OT Assessment / Plan / Recommendation  OT comments    Follow Up Recommendations  Home health OT;Supervision/Assistance - 24 hour    Barriers to Discharge       Equipment Recommendations  None recommended by OT    Recommendations for Other Services    Frequency Min 2X/week   Progress towards OT Goals    Plan Discharge plan remains appropriate    Precautions / Restrictions Precautions Precautions: Fall Precaution Comments: h/ falls at home Restrictions Weight Bearing Restrictions: No Other Position/Activity Restrictions: WBAT   Pertinent Vitals/Pain     ADL  Eating/Feeding: Independent Where Assessed - Eating/Feeding: Bed level Grooming: Teeth care;Set up Where Assessed - Grooming: Supine, head of bed up Lower Body Bathing: Simulated;Minimal assistance Where Assessed - Lower Body Bathing: Supported sit to stand Lower Body Dressing: Moderate assistance;Performed Where Assessed - Lower Body Dressing: Supported sit to stand Equipment Used: Long-handled shoe horn;Long-handled sponge;Reacher;Rolling walker;Sock aid Transfers/Ambulation Related to ADLs: Min A overall with bed mobility, sit to stand, side steps along EOB using RW. ADL Comments: Patient has AE at home. Practiced using sponge to simulate LB bathing sitting EOB. Doffed L sock with reacher and donned L sock with sock aid with min A overall. Pt's HR was 109-119 during tx.      OT Goals(current goals can now be found in the care plan section)    Visit Information  Last OT Received On: 09/09/12 Assistance Needed: +1    Cognition  Cognition Arousal/Alertness: Awake/alert Behavior During Therapy: WFL for tasks assessed/performed Overall Cognitive Status: Within Functional Limits for tasks assessed    Mobility  Bed Mobility Supine to  Sit: 4: Min assist Sit to Supine: 4: Min assist Details for Bed Mobility Assistance: cues for sequence and use of L LE to self assist    End of Session OT - End of Session Equipment Utilized During Treatment: Rolling walker Activity Tolerance: Patient tolerated treatment well Patient left: in bed;with call bell/phone within reach  GO     Latera Mclin A 09/09/2012, 9:17 AM

## 2012-09-09 NOTE — Progress Notes (Signed)
Subjective: 2 Days Post-Op Procedure(s) (LRB): RIGHT TOTAL HIP ARTHROPLASTY ANTERIOR APPROACH (Right) Patient reports pain as mild.    Objective: Vital signs in last 24 hours: Temp:  [97.6 F (36.4 C)-99.1 F (37.3 C)] 98.7 F (37.1 C) (07/13 0455) Pulse Rate:  [92-104] 92 (07/13 0455) Resp:  [16-20] 20 (07/13 0743) BP: (111-119)/(68-76) 119/76 mmHg (07/13 0455) SpO2:  [93 %-100 %] 96 % (07/13 0904)  Intake/Output from previous day: 07/12 0701 - 07/13 0700 In: 1966.3 [P.O.:460; I.V.:1506.3] Out: 800 [Urine:800] Intake/Output this shift:     Recent Labs  09/08/12 0521 09/09/12 0455  HGB 9.7* 7.9*    Recent Labs  09/08/12 0521 09/09/12 0455  WBC 13.1* 10.2  RBC 3.04* 2.42*  HCT 28.6* 22.5*  PLT 179 132*    Recent Labs  09/08/12 0521  NA 135  K 4.3  CL 102  CO2 25  BUN 11  CREATININE 0.93  GLUCOSE 173*  CALCIUM 8.1*   No results found for this basename: LABPT, INR,  in the last 72 hours  Neurologically intact  Assessment/Plan: 2 Days Post-Op Procedure(s) (LRB): RIGHT TOTAL HIP ARTHROPLASTY ANTERIOR APPROACH (Right) Up with therapy  ,   HAS POST OP URINARY RETENTION HAD TO BE CATHETERIZED YESTERDAY. NO BOWEL MOVEMENT YET. D/C iv  YATES,MARK C 09/09/2012, 9:22 AM

## 2012-09-09 NOTE — Progress Notes (Signed)
Physical Therapy Treatment Patient Details Name: [name] MRN: [id number] DOB: 12-15-1932 Today's Date: 09/09/2012 Time: 7829-5621 PT Time Calculation (min): 24 min  PT Assessment / Plan / Recommendation  PT Comments   Progressing well with mobility.   Follow Up Recommendations  Home health PT     Does the patient have the potential to tolerate intense rehabilitation     Barriers to Discharge        Equipment Recommendations  None recommended by PT    Recommendations for Other Services OT consult  Frequency 7X/week   Progress towards PT Goals Progress towards PT goals: Progressing toward goals  Plan Current plan remains appropriate    Precautions / Restrictions Precautions Precautions: Fall Precaution Comments: h/ falls at home Restrictions Weight Bearing Restrictions: No Other Position/Activity Restrictions: WBAT   Pertinent Vitals/Pain 5/10 L hip with activity. RN notified of need for pain meds    Mobility  Bed Mobility Bed Mobility: Sit to Supine Sit to Supine: 4: Min assist Details for Bed Mobility Assistance: assist for LEs onto bed.  Transfers Transfers: Sit to Stand;Stand to Sit Sit to Stand: 4: Min assist;From chair/3-in-1 Stand to Sit: 4: Min guard;To bed Details for Transfer Assistance: Assist to rise, stabilize.Cues for hand placement Ambulation/Gait Ambulation/Gait Assistance: 4: Min guard Ambulation Distance (Feet): 135 Feet Assistive device: Rolling walker Ambulation/Gait Assistance Details: slow gait speed. VCs posture, step length.  Gait Pattern: Step-to pattern;Narrow base of support;Trunk flexed;Decreased stride length    Exercises Total Joint Exercises Ankle Circles/Pumps: AROM;Both;10 reps;Supine Quad Sets: AROM;Both;10 reps;Supine Heel Slides: AAROM;Left;10 reps;Supine Hip ABduction/ADduction: AAROM;Left;10 reps;Supine   PT Diagnosis:    PT Problem List:   PT Treatment Interventions:     PT Goals (current goals can now be found  in the care plan section)    Visit Information  Last PT Received On: 09/09/12 Assistance Needed: +1 History of Present Illness: 79 y.o. male with h/o Lt. anterior THA, admitted for  Rt. THA direct anterior approach    Subjective Data      Cognition  Cognition Arousal/Alertness: Awake/alert Behavior During Therapy: WFL for tasks assessed/performed Overall Cognitive Status: Within Functional Limits for tasks assessed    Balance     End of Session PT - End of Session Activity Tolerance: Patient tolerated treatment well Patient left: in bed;with call bell/phone within reach   GP     [name], MPT Pager: [phone]

## 2012-09-09 NOTE — Progress Notes (Signed)
Pt attempted void. Unable to urinate but not in discomfort. Bladder scan= 157 mL. Encouraged pt to increase PO intake. IVF currently running at 75/hr. Will closely monitor and attempt again within 2 hours. Haddie Bruhl [facility] [employer], RN 1:10 AM 09/09/2012

## 2012-09-09 NOTE — Progress Notes (Signed)
Physical Therapy Treatment Patient Details Name: TREVONN HALLUM MRN: [id number] DOB: 1933-02-24 Today's Date: 09/09/2012 Time: 4098-1191 PT Time Calculation (min): 27 min  PT Assessment / Plan / Recommendation  PT Comments   Progressing with mobility.   Follow Up Recommendations  Home health PT     Does the patient have the potential to tolerate intense rehabilitation     Barriers to Discharge        Equipment Recommendations  None recommended by PT    Recommendations for Other Services OT consult  Frequency 7X/week   Progress towards PT Goals Progress towards PT goals: Progressing toward goals  Plan Current plan remains appropriate    Precautions / Restrictions Precautions Precautions: Fall Precaution Comments: h/ falls at home Restrictions Weight Bearing Restrictions: No Other Position/Activity Restrictions: WBAT   Pertinent Vitals/Pain 7/10 R hip    Mobility  Bed Mobility Bed Mobility: Supine to Sit Supine to Sit: 4: Min assist;HOB elevated;With rails Sit to Supine: 4: Min assist Details for Bed Mobility Assistance: cues for sequence and use of L LE to self assist. Increased time.  Transfers Transfers: Sit to Stand;Stand to Sit Sit to Stand: 4: Min assist;From bed;From elevated surface;With upper extremity assist Stand to Sit: 4: Min assist;To chair/3-in-1;With armrests;With upper extremity assist Details for Transfer Assistance: Assist to rise, stabilize, control descent. Cues for hand placement Ambulation/Gait Ambulation/Gait Assistance: 4: Min assist Ambulation Distance (Feet): 100 Feet Assistive device: Rolling walker Ambulation/Gait Assistance Details: Slow gait speed. VCs for step length, position fron RW, posture. 2 brief standing rest breaks due to dyspnea 2/4 Gait Pattern: Step-to pattern;Decreased step length - right;Decreased step length - left;Antalgic;Narrow base of support    Exercises     PT Diagnosis:    PT Problem List:   PT Treatment  Interventions:     PT Goals (current goals can now be found in the care plan section)    Visit Information  Last PT Received On: 09/09/12 Assistance Needed: +1 History of Present Illness: 79 y.o. male with h/o Lt. anterior THA, admitted for  Rt. THA direct anterior approach    Subjective Data      Cognition  Cognition Arousal/Alertness: Awake/alert Behavior During Therapy: WFL for tasks assessed/performed Overall Cognitive Status: Within Functional Limits for tasks assessed    Balance     End of Session PT - End of Session Activity Tolerance: Patient tolerated treatment well Patient left: in chair;with call bell/phone within reach   GP     [name], MPT Pager: [phone]

## 2012-09-09 NOTE — Progress Notes (Signed)
Pt unable to void. Bladder scanner showing negligible amounts, but pt feels the need to void and his bladder is distended. I&O cath = 400cc. Willmer Fellers, [employer]

## 2012-09-09 NOTE — Progress Notes (Signed)
Patient ID: [name], male   DOB: 03-11-1932, 79 y.o.   MRN: [id number] Will transfuse 2 units of blood due to low H&H combined with low urine output.

## 2012-09-10 LAB — TYPE AND SCREEN
ABO/RH(D): O POS
Antibody Screen: POSITIVE
Donor AG Type: NEGATIVE
Donor AG Type: NEGATIVE
Unit division: 0
Unit division: 0

## 2012-09-10 LAB — CBC
HCT: 28.1 % — ABNORMAL LOW (ref 39.0–52.0)
Hemoglobin: 9.7 g/dL — ABNORMAL LOW (ref 13.0–17.0)
MCH: 31 pg (ref 26.0–34.0)
MCHC: 34.5 g/dL (ref 30.0–36.0)
MCV: 89.8 fL (ref 78.0–100.0)
Platelets: 143 10*3/uL — ABNORMAL LOW (ref 150–400)
RBC: 3.13 MIL/uL — ABNORMAL LOW (ref 4.22–5.81)
RDW: 16 % — ABNORMAL HIGH (ref 11.5–15.5)
WBC: 13.7 10*3/uL — ABNORMAL HIGH (ref 4.0–10.5)

## 2012-09-10 MED ORDER — TAMSULOSIN HCL 0.4 MG PO CAPS
0.4000 mg | ORAL_CAPSULE | Freq: Every day | ORAL | Status: DC
  Administered 2012-09-10 – 2012-09-11 (×2): 0.4 mg via ORAL
  Filled 2012-09-10 (×2): qty 1

## 2012-09-10 MED ORDER — CIPROFLOXACIN HCL 500 MG PO TABS
500.0000 mg | ORAL_TABLET | Freq: Two times a day (BID) | ORAL | Status: DC
  Administered 2012-09-10 – 2012-09-11 (×3): 500 mg via ORAL
  Filled 2012-09-10 (×6): qty 1

## 2012-09-10 NOTE — Progress Notes (Signed)
Patient's IV began leaking blood, with approximately 40 mL remaining in bag. IV site could not be saved. Removed IV, tubing intact. Amyrie Illingworth [facility]der [employer], [place] 12:37 AM 09/10/2012

## 2012-09-10 NOTE — Progress Notes (Signed)
Physical Therapy Treatment Patient Details Name: [name] MRN: [id number] DOB: [birth year] Today's Date: 09/10/2012 Time: 4098-1191 PT Time Calculation (min): 20 min  PT Assessment / Plan / Recommendation  PT Comments   Progressing with mobility. Plan is for d/c home tomorrow.   Follow Up Recommendations  Home health PT     Does the patient have the potential to tolerate intense rehabilitation     Barriers to Discharge        Equipment Recommendations  None recommended by PT    Recommendations for Other Services OT consult  Frequency 7X/week   Progress towards PT Goals Progress towards PT goals: Progressing toward goals  Plan Current plan remains appropriate    Precautions / Restrictions Precautions Precautions: Fall Precaution Comments: h/ falls at home Restrictions Weight Bearing Restrictions: No Other Position/Activity Restrictions: WBAT   Pertinent Vitals/Pain 5/10 R hip. RN notified of need for pain meds    Mobility  Bed Mobility Bed Mobility: Supine to Sit;Sit to Supine Supine to Sit: 4: Min guard;HOB elevated;With rails Sit to Supine: 4: Min assist Details for Bed Mobility Assistance: assist for LEs onto bed.  Transfers Transfers: Sit to Stand;Stand to Sit Sit to Stand: 4: Min guard;From bed Stand to Sit: 4: Min guard;To bed Details for Transfer Assistance: min verbal cues for hand placement.  Ambulation/Gait Ambulation/Gait Assistance: 4: Min guard Ambulation Distance (Feet): 135 Feet Assistive device: Rolling walker Ambulation/Gait Assistance Details: slow gait speed. encouraged longer steps.  Gait Pattern: Step-to pattern;Decreased stride length;Decreased step length - right;Decreased step length - left;Trunk flexed;Narrow base of support    Exercises Total Joint Exercises Ankle Circles/Pumps: AROM;Both;15 reps;Supine Quad Sets: AROM;Both;10 reps;Supine Heel Slides: AAROM;Right;10 reps;Supine Hip ABduction/ADduction: AAROM;Right;10 reps;Supine    PT Diagnosis:    PT Problem List:   PT Treatment Interventions:     PT Goals (current goals can now be found in the care plan section)    Visit Information  Last PT Received On: 09/10/12 Assistance Needed: +1 History of Present Illness: 79 y.o. male with h/o Lt. anterior THA, admitted for  Rt. THA direct anterior approach    Subjective Data      Cognition  Cognition Arousal/Alertness: Awake/[name] Behavior During Therapy: WFL for tasks assessed/performed Overall Cognitive Status: Within Functional Limits for tasks assessed    Balance     End of Session PT - End of Session Activity Tolerance: Patient tolerated treatment well Patient left: in bed;with call bell/phone within reach Nurse Communication: Patient requests pain meds   GP     [name], MPT Pager: [phone]

## 2012-09-10 NOTE — Progress Notes (Signed)
Patient without spontaneous void. Bladder scan shows 249 mL at 0015. Pt with no discomfort, distension. Encouraged increased PO intake, and will continue to monitor and treat as necessary. [name] [place], [place] 09/10/2012 12:49 AM

## 2012-09-10 NOTE — Progress Notes (Signed)
Subjective: 3 Days Post-Op Procedure(s) (LRB): RIGHT TOTAL HIP ARTHROPLASTY ANTERIOR APPROACH (Right) Patient reports pain as mild.  Had to put in foley due to multiple attempts to in-and-out cath.  Now with some slight discomfort and swelling at penis.  WBC also up to 13.  Objective: Vital signs in last 24 hours: Temp:  [97.4 F (36.3 C)-99.2 F (37.3 C)] 99.2 F (37.3 C) (07/14 0505) Pulse Rate:  [90-108] 102 (07/14 0505) Resp:  [18-24] 20 (07/14 0505) BP: (105-145)/(53-82) 138/82 mmHg (07/14 0505) SpO2:  [94 %-99 %] 94 % (07/14 0505)  Intake/Output from previous day: 07/13 0701 - 07/14 0700 In: 1079.2 [P.O.:180; I.V.:250; Blood:649.2] Out: 750 [Urine:750] Intake/Output this shift:     Recent Labs  09/08/12 0521 09/09/12 0455 09/10/12 0447  HGB 9.7* 7.9* 9.7*    Recent Labs  09/09/12 0455 09/10/12 0447  WBC 10.2 13.7*  RBC 2.42* 3.13*  HCT 22.5* 28.1*  PLT 132* 143*    Recent Labs  09/08/12 0521  NA 135  K 4.3  CL 102  CO2 25  BUN 11  CREATININE 0.93  GLUCOSE 173*  CALCIUM 8.1*   No results found for this basename: LABPT, INR,  in the last 72 hours  Sensation intact distally Intact pulses distally Dorsiflexion/Plantar flexion intact Incision: no drainage Compartment soft  Assessment/Plan: 3 Days Post-Op Procedure(s) (LRB): RIGHT TOTAL HIP ARTHROPLASTY ANTERIOR APPROACH (Right) Up with therapy Plan for discharge tomorrow Start flomax Start Cipro due to multiple foley caths, penis swelling, and increase in [facility]  BLACKMAN,CHRISTOPHER Y 09/10/2012, 7:37 AM

## 2012-09-10 NOTE — Progress Notes (Signed)
Physical Therapy Treatment Patient Details Name: [name] MRN: [id number] DOB: Nov 02, 1932 Today's Date: 09/10/2012 Time: 1400-1430 PT Time Calculation (min): 30 min  PT Assessment / Plan / Recommendation  PT Comments   Progressing with mobility. Plan is for possible d/c home tomorrow   Follow Up Recommendations  Home health PT     Does the patient have the potential to tolerate intense rehabilitation     Barriers to Discharge        Equipment Recommendations  None recommended by PT    Recommendations for Other Services OT consult  Frequency 7X/week   Progress towards PT Goals Progress towards PT goals: Progressing toward goals  Plan Current plan remains appropriate    Precautions / Restrictions Precautions Precautions: Fall Precaution Comments: h/ falls at home Restrictions Weight Bearing Restrictions: No Other Position/Activity Restrictions: WBAT   Pertinent Vitals/Pain 5/10 R hip    Mobility  Bed Mobility Bed Mobility: Supine to Sit;Sit to Supine Supine to Sit: HOB elevated;With rails;4: Min assist Sit to Supine: 4: Min assist Details for Bed Mobility Assistance: assist for LEs off/onto bed.  Transfers Transfers: Sit to Stand;Stand to Sit Sit to Stand: 4: Min guard;From bed Stand to Sit: 4: Min guard;To bed Details for Transfer Assistance: min verbal cues for hand placement.  Ambulation/Gait Ambulation/Gait Assistance: 4: Min guard Ambulation Distance (Feet): 140 Feet Assistive device: Rolling walker Ambulation/Gait Assistance Details: slow gait speed.  Gait Pattern: Step-to pattern;Decreased stride length;Decreased step length - right;Decreased step length - left;Trunk flexed;Narrow base of support    Exercises Total Joint Exercises Ankle Circles/Pumps: AROM;Both;15 reps;Supine Quad Sets: AROM;Both;10 reps;Supine Heel Slides: AAROM;Right;10 reps;Supine Hip ABduction/ADduction: AAROM;Right;10 reps;Supine   PT Diagnosis:    PT Problem List:   PT  Treatment Interventions:     PT Goals (current goals can now be found in the care plan section)    Visit Information  Last PT Received On: 09/10/12 Assistance Needed: +1 History of Present Illness: 79 y.o. male with h/o Lt. anterior THA, admitted for  Rt. THA direct anterior approach    Subjective Data      Cognition  Cognition Arousal/Alertness: Awake/alert Behavior During Therapy: WFL for tasks assessed/performed Overall Cognitive Status: Within Functional Limits for tasks assessed    Balance     End of Session PT - End of Session Activity Tolerance: Patient tolerated treatment well Patient left: in bed;with call bell/phone within reach;with family/visitor present   GP     [name], MPT Pager: [phone]

## 2012-09-10 NOTE — Progress Notes (Signed)
Occupational Therapy Treatment Patient Details Name: [name] MRN: [id number] DOB: 1932/12/21 Today's Date: 09/10/2012 Time: 7829-5621 OT Time Calculation (min): 34 min  OT Assessment / Plan / Recommendation  OT comments  Pt limited by some nausea and pain this visit. Tolerated up for toilet transfer and then to recliner. Pt states grandchildren will rotate staying with pt to help PRN.   Follow Up Recommendations  Home health OT;Supervision/Assistance - 24 hour    Barriers to Discharge       Equipment Recommendations  None recommended by OT    Recommendations for Other Services    Frequency Min 2X/week   Progress towards OT Goals Progress towards OT goals: Progressing toward goals  Plan Discharge plan remains appropriate    Precautions / Restrictions Precautions Precautions: Fall Precaution Comments: h/ falls at home Restrictions Weight Bearing Restrictions: No Other Position/Activity Restrictions: WBAT   Pertinent Vitals/Pain 3/10 in R  Hip; reposition, ice    ADL  Toilet Transfer: Performed;Minimal assistance Toilet Transfer Method: Other (comment) (with walker into bathroom) Toilet Transfer Equipment: Raised toilet seat with arms (or 3-in-1 over toilet) Toileting - Clothing Manipulation and Hygiene: Simulated;Minimal assistance Where Assessed - Toileting Clothing Manipulation and Hygiene: Sit to stand from 3-in-1 or toilet Equipment Used: Rolling walker ADL Comments: Pt reports some nausea at start of session but as session progressed, stated pain was more limiting for him than nausea. Pt has all AE except LHS. Educated pt on where to obtain sponge if desired. Pt states he is familiar with how to use all AE and declines practicing with it right now due to discomfort. Repositioned in chair with ice to rest. Pt has a 3in1 and also a higher toilet but only a vanity beside. Discussed use of 3in1 initially for safety and pt states he can get grandchildren to set up 3in1  over toilet Pt does well with hand held supports on both sides of chair.     OT Diagnosis:    OT Problem List:   OT Treatment Interventions:     OT Goals(current goals can now be found in the care plan section) Acute Rehab OT Goals Patient Stated Goal: Return home and regain independence  Visit Information  Last OT Received On: 09/10/12 Assistance Needed: +1 History of Present Illness: 79 y.o. male with h/o Lt. anterior THA, admitted for  Rt. THA direct anterior approach    Subjective Data      Prior Functioning       Cognition  Cognition Arousal/Alertness: Awake/alert Behavior During Therapy: WFL for tasks assessed/performed Overall Cognitive Status: Within Functional Limits for tasks assessed    Mobility  Bed Mobility Supine to Sit: 4: Min assist;HOB elevated Transfers Transfers: Sit to Stand;Stand to Sit Sit to Stand: 4: Min guard;With upper extremity assist;From bed;From chair/3-in-1 Stand to Sit: With upper extremity assist;To chair/3-in-1;4: Min assist Details for Transfer Assistance: min verbal cues for hand placement. min assist to find potty chair armrest to sit.    Exercises      Balance     End of Session OT - End of Session Activity Tolerance: Patient tolerated treatment well Patient left: in chair;with call bell/phone within reach  GO     Lennox Laity [phone] 09/10/2012, 11:07 AM

## 2012-09-11 ENCOUNTER — Ambulatory Visit: Payer: Self-pay | Admitting: Family Medicine

## 2012-09-11 ENCOUNTER — Encounter (HOSPITAL_COMMUNITY): Payer: Self-pay | Admitting: Orthopaedic Surgery

## 2012-09-11 ENCOUNTER — Other Ambulatory Visit: Payer: Self-pay | Admitting: Endocrinology

## 2012-09-11 MED ORDER — TAMSULOSIN HCL 0.4 MG PO CAPS: 0.4000 mg | ORAL_CAPSULE | Freq: Every day | ORAL | Status: DC

## 2012-09-11 MED ORDER — ASPIRIN 325 MG PO TBEC: 325.0000 mg | DELAYED_RELEASE_TABLET | Freq: Two times a day (BID) | ORAL | Status: DC

## 2012-09-11 MED ORDER — TRAMADOL HCL 50 MG PO TABS: 100.0000 mg | ORAL_TABLET | Freq: Four times a day (QID) | ORAL | Status: DC | PRN

## 2012-09-11 MED ORDER — CIPROFLOXACIN HCL 500 MG PO TABS: 500.0000 mg | ORAL_TABLET | Freq: Two times a day (BID) | ORAL | Status: DC

## 2012-09-11 NOTE — Progress Notes (Signed)
Patient unable to void on own after 2nd attempt. C/o discomfort with abd distension. Bladder scan result 546cc. Po fluids have been pushed without effectiveness in voiding. Unable to insert 14 french foley catheter re: could not insert pass blockage. Has history of enlarged prostate. Dr. August Saucer returned page to insert coude foley.

## 2012-09-11 NOTE — Progress Notes (Signed)
Placed 41F Coude cath in patient without any problems.  Yellow urine returned.  Will continue to monitor patient.

## 2012-09-11 NOTE — Discharge Summary (Signed)
Patient ID: [name] MRN: [id number] DOB/AGE: [birth year] 79 y.o.  Admit date: 09/07/2012 Discharge date: 09/11/2012  Admission Diagnoses:  Principal Problem:   Arthritis pain of hip   Discharge Diagnoses:  Same  Past Medical History  Diagnosis Date  . Emphysema   . CAD (coronary artery disease)     left main had  a 20% stenosis.  Left anterior descending had 40-50% proximal   stenosis.  Left circumflex had an 80% proximal stenosis.  The  first obtuse marginal artery had a 60% mid stenosis.  The  right  coronary artery  had  a 50-60% proximal stenosis with a 60-70%  mid stenosis  The patient was managed medically.   Marland Kitchen COPD (chronic obstructive pulmonary disease)   . Hypothyroidism   . Anemia   . Blood transfusion june 2011, 5 units given    july 2011 some units given  . Enlarged prostate     elevated psa recently  . GERD (gastroesophageal reflux disease)   . Arthritis   . Complication of anesthesia     likes spinal due to copd  . Dizziness     occasional  . Shortness of breath     with exertion    Surgeries: Procedure(s): RIGHT TOTAL HIP ARTHROPLASTY ANTERIOR APPROACH on 09/07/2012   Consultants:    Discharged Condition: Improved  Hospital Course: SHIMSHON NARULA is an 79 y.o. male who was admitted 09/07/2012 for operative treatment ofArthritis pain of hip. Patient has severe unremitting pain that affects sleep, daily activities, and work/hobbies. After pre-op clearance the patient was taken to the operating room on 09/07/2012 and underwent  Procedure(s): RIGHT TOTAL HIP ARTHROPLASTY ANTERIOR APPROACH.    Patient was given perioperative antibiotics: Anti-infectives   Start     Dose/Rate Route Frequency Ordered Stop   09/11/12 0000  ciprofloxacin (CIPRO) 500 MG tablet     500 mg Oral 2 times daily 09/11/12 0616     09/10/12 0800  ciprofloxacin (CIPRO) tablet 500 mg     500 mg Oral 2 times daily 09/10/12 0736     09/07/12 2100  clindamycin (CLEOCIN) IVPB 600 mg      600 mg 100 mL/hr over 30 Minutes Intravenous Every 6 hours 09/07/12 1847 09/08/12 0300   09/07/12 1105  vancomycin (VANCOCIN) IVPB 1000 mg/200 mL premix     1,000 mg 200 mL/hr over 60 Minutes Intravenous On call to O.R. 09/07/12 1105 09/07/12 1458       Patient was given sequential compression devices, early ambulation, and chemoprophylaxis to prevent DVT.  Patient benefited maximally from hospital stay and there were no complications.    Recent vital signs: Patient Vitals for the past 24 hrs:  BP Temp Temp src Pulse Resp SpO2  09/11/12 0405 117/71 mmHg 98.7 F (37.1 C) Oral 87 20 92 %  09/10/12 2139 110/66 mmHg 97.3 F (36.3 C) Oral 96 18 96 %  09/10/12 2032 - - - - - 95 %  09/10/12 1600 - - - - 20 -  09/10/12 1400 111/56 mmHg 99.6 F (37.6 C) Oral 95 18 93 %  09/10/12 1121 - - - - 18 -  09/10/12 0809 - - - - - 94 %  09/10/12 0800 - - - - 18 -     Recent laboratory studies:  Recent Labs  09/09/12 0455 09/10/12 0447  WBC 10.2 13.7*  HGB 7.9* 9.7*  HCT 22.5* 28.1*  PLT 132* 143*     Discharge Medications:  Medication List    STOP taking these medications       doxycycline 100 MG tablet  Commonly known as:  VIBRA-TABS      TAKE these medications       albuterol 108 (90 BASE) MCG/ACT inhaler  Commonly known as:  PROAIR HFA  Inhale 2 puffs into the lungs every 6 (six) hours as needed for wheezing.     aspirin 325 MG EC tablet  Take 1 tablet (325 mg total) by mouth 2 (two) times daily after a meal.     bromocriptine 2.5 MG tablet  Commonly known as:  PARLODEL  Take 2.5 mg by mouth at bedtime.     budesonide-formoterol 160-4.5 MCG/ACT inhaler  Commonly known as:  SYMBICORT  Inhale 2 puffs into the lungs 2 (two) times daily.     cholecalciferol 1000 UNITS tablet  Commonly known as:  VITAMIN D  Take 1,000 Units by mouth 2 (two) times daily.     ciprofloxacin 500 MG tablet  Commonly known as:  CIPRO  Take 1 tablet (500 mg total) by mouth 2 (two)  times daily.     ferrous sulfate 325 (65 FE) MG tablet  Take 325 mg by mouth daily with breakfast.     guaiFENesin 600 MG 12 hr tablet  Commonly known as:  MUCINEX  Take 600 mg by mouth daily.     hyoscyamine 0.125 MG SL tablet  Commonly known as:  LEVSIN SL  Place 0.125 mg under the tongue every 4 (four) hours as needed. For stomach cramps     levothyroxine 50 MCG tablet  Commonly known as:  SYNTHROID, LEVOTHROID  Take 50 mcg by mouth at bedtime.     methocarbamol 500 MG tablet  Commonly known as:  ROBAXIN  Take 500 mg by mouth 4 (four) times daily as needed (pain).     montelukast 10 MG tablet  Commonly known as:  SINGULAIR  Take 10 mg by mouth daily as needed. For allergies     multivitamin with minerals Tabs  Take 1 tablet by mouth every evening.     nitroGLYCERIN 0.4 MG SL tablet  Commonly known as:  NITROSTAT  Place 1 tablet (0.4 mg total) under the tongue every 5 (five) minutes as needed.     pantoprazole 40 MG tablet  Commonly known as:  PROTONIX  Take 40 mg by mouth daily with breakfast.     pravastatin 40 MG tablet  Commonly known as:  PRAVACHOL  Take 20 mg by mouth every other day.     traMADol 50 MG tablet  Commonly known as:  ULTRAM  Take 2 tablets (100 mg total) by mouth every 6 (six) hours as needed.        Diagnostic Studies: Dg Chest 2 View  09/03/2012   *RADIOLOGY REPORT*  Clinical Data: Preop for right total hip arthroplasty.  CHEST - 2 VIEW  Comparison: Chest and rib films 04/25/2012.  Findings: The heart size is normal.  The lungs are clear.  Chronic elevation of the posterior left hemidiaphragm is again noted. Emphysematous changes are evident.  A remote superior endplate compression fracture at T12 is stable.  Remote left-sided rib fractures are again noted.  IMPRESSION:  1.  No acute cardiopulmonary disease or significant interval change. 2.  Emphysema.   Original Report Authenticated By: Marin Roberts, M.D.   Dg Hip Complete  Right  09/07/2012   *RADIOLOGY REPORT*  Clinical Data: Right hip replacement.  DG C-ARM 61-120 MIN -  [facility], RIGHT HIP - COMPLETE 2+ VIEW  Comparison: Single view of the pelvis 07/21/2011.  Findings: Single fluoroscopic spot view of the right hip is provided.  A new right total hip arthroplasty is in place. Surgical wound is noted.  The device appears located and no fracture is identified.  IMPRESSION: Right total hip replacement.   Original Report Authenticated By: Holley Dexter, M.D.   Dg Pelvis Portable  09/07/2012   *RADIOLOGY REPORT*  Clinical Data: Status post hip replacement.  PORTABLE PELVIS  Comparison: Single view of the pelvis 07/21/2011.  Findings: The patient has a new right total hip arthroplasty.  The device is located.  There is no fracture.  Gas in the soft tissues from surgery is noted.  Left hip replacement is again seen.  IMPRESSION: Right total hip replacement without complication.  No acute finding.   Original Report Authenticated By: Holley Dexter, M.D.   Dg C-arm 61-120 Min-no Report  09/07/2012   *RADIOLOGY REPORT*  Clinical Data: Right hip replacement.  DG C-ARM 61-120 MIN - [facility], RIGHT HIP - COMPLETE 2+ VIEW  Comparison: Single view of the pelvis 07/21/2011.  Findings: Single fluoroscopic spot view of the right hip is provided.  A new right total hip arthroplasty is in place. Surgical wound is noted.  The device appears located and no fracture is identified.  IMPRESSION: Right total hip replacement.   Original Report Authenticated By: Holley Dexter, M.D.    Disposition:  To home      Discharge Orders   Future Appointments Provider Department Dept Phone   09/26/2012 10:00 AM [name], MD [facility] FAMILY MEDICINE [phone]   11/12/2012 10:00 AM Romero Belling, MD [facility] PRIMARY CARE ENDOCRINOLOGY [phone]   Future Orders Complete By Expires     Call MD / Call 911  As directed     Comments:      If you experience chest pain or  shortness of breath, CALL 911 and be transported to the hospital emergency room.  If you develope a fever above 101 F, pus (white drainage) or increased drainage or redness at the wound, or calf pain, call your surgeon's office.    Constipation Prevention  As directed     Comments:      Drink plenty of fluids.  Prune juice may be helpful.  You may use a stool softener, such as Colace (over the counter) 100 mg twice a day.  Use MiraLax (over the counter) for constipation as needed.    Diet - low sodium heart healthy  As directed     Discharge instructions  As directed     Comments:      Increase your activities as comfort allows. Leave your current dressing in place on your right hip incision until this coming Friday. You can get your current dressing wet in the shower; then on Friday, remove your dressing and getting your actual incision wet daily. Expect right thigh, leg, and foot swelling.    Discharge patient  As directed     Discharge wound care:  As directed     Comments:      Keep dressing clean dry and intact until Friday then remove dressing and shower. Apply clean dressing after showering    Increase activity slowly as tolerated  As directed     Weight bearing as tolerated  As directed        Follow-up Information   Follow up with Kathryne Hitch, MD. Schedule an appointment as soon  as possible for a visit in 2 weeks.   Contact information:   [address] [place] [address] [phone]        Signed: Kathryne Hitch 09/11/2012, 7:26 AM

## 2012-09-11 NOTE — Progress Notes (Signed)
Utilization review completed.  

## 2012-09-11 NOTE — Progress Notes (Signed)
Physical Therapy Treatment Patient Details Name: [name] MRN: [id number] DOB: [birth year] Today's Date: 09/11/2012 Time: 1191-4782 PT Time Calculation (min): 24 min  PT Assessment / Plan / Recommendation  PT Comments   Continuing to progress well. Plan is for d/c home today. Completed all education. Ready for d/c home from PT standpoint  Follow Up Recommendations  Home health PT     Does the patient have the potential to tolerate intense rehabilitation     Barriers to Discharge        Equipment Recommendations  None recommended by PT    Recommendations for Other Services OT consult  Frequency 7X/week   Progress towards PT Goals Progress towards PT goals: Progressing toward goals  Plan Current plan remains appropriate    Precautions / Restrictions Precautions Precautions: Fall Precaution Comments: h/ falls at home Restrictions Weight Bearing Restrictions: No Other Position/Activity Restrictions: WBAT   Pertinent Vitals/Pain 5/10 R hip    Mobility  Bed Mobility Bed Mobility: Supine to Sit Supine to Sit: HOB elevated;With rails;4: Min guard Transfers Transfers: Sit to Stand;Stand to Sit Sit to Stand: 4: Min guard;From bed Stand to Sit: 4: Min guard;To chair/3-in-1;With armrests Ambulation/Gait Ambulation/Gait Assistance: 4: Min guard Ambulation Distance (Feet): 137 Feet Assistive device: Rolling walker Gait Pattern: Step-to pattern;Trunk flexed;Decreased stride length    Exercises Total Joint Exercises Ankle Circles/Pumps: AROM;Both;Supine Quad Sets: AROM;Both;Supine Heel Slides: AAROM;10 reps;Supine;Right Hip ABduction/ADduction: AAROM;Right;10 reps;Supine   PT Diagnosis:    PT Problem List:   PT Treatment Interventions:     PT Goals (current goals can now be found in the care plan section) Acute Rehab PT Goals Patient Stated Goal: Return home and regain independence  Visit Information  Last PT Received On: 09/11/12 Assistance Needed: +1 History  of Present Illness: 79 y.o. male with h/o Lt. anterior THA, admitted for  Rt. THA direct anterior approach    Subjective Data  Patient Stated Goal: Return home and regain independence   Cognition  Cognition Arousal/Alertness: Awake/alert Behavior During Therapy: WFL for tasks assessed/performed Overall Cognitive Status: Within Functional Limits for tasks assessed    Balance  Balance Balance Assessed: Yes Dynamic Standing Balance Dynamic Standing - Level of Assistance: 4: Min assist (min guard)  End of Session PT - End of Session Equipment Utilized During Treatment: Gait belt Activity Tolerance: Patient tolerated treatment well Patient left: in bed;with call bell/phone within reach;with family/visitor present   GP     [name], MPT Pager: [phone]

## 2012-09-11 NOTE — Progress Notes (Signed)
Subjective: 4 Days Post-Op Procedure(s) (LRB): RIGHT TOTAL HIP ARTHROPLASTY ANTERIOR APPROACH (Right) Patient reports pain as mild.  Still some problems with voiding.  Objective: Vital signs in last 24 hours: Temp:  [97.3 F (36.3 C)-99.6 F (37.6 C)] 98.7 F (37.1 C) (07/15 0405) Pulse Rate:  [87-96] 87 (07/15 0405) Resp:  [18-20] 20 (07/15 0405) BP: (110-117)/(56-71) 117/71 mmHg (07/15 0405) SpO2:  [92 %-96 %] 92 % (07/15 0405)  Intake/Output from previous day: 07/14 0701 - 07/15 0700 In: 720 [P.O.:720] Out: 1020 [Urine:1020] Intake/Output this shift:     Recent Labs  09/09/12 0455 09/10/12 0447  HGB 7.9* 9.7*    Recent Labs  09/09/12 0455 09/10/12 0447  WBC 10.2 13.7*  RBC 2.42* 3.13*  HCT 22.5* 28.1*  PLT 132* 143*   No results found for this basename: NA, K, CL, CO2, BUN, CREATININE, GLUCOSE, CALCIUM,  in the last 72 hours No results found for this basename: LABPT, INR,  in the last 72 hours  Sensation intact distally Intact pulses distally Dorsiflexion/Plantar flexion intact Incision: dressing C/D/I  Assessment/Plan: 4 Days Post-Op Procedure(s) (LRB): RIGHT TOTAL HIP ARTHROPLASTY ANTERIOR APPROACH (Right) Discharge home with home health  Kathryne Hitch 09/11/2012, 7:20 AM

## 2012-09-11 NOTE — Progress Notes (Signed)
Discharge teaching done with patients wife for catheter and foley care, patient discharged with coude to leg bag, and foley bag with instructions for sleep.   Sharrell Ku RN

## 2012-09-11 NOTE — Progress Notes (Signed)
Occupational Therapy Treatment Patient Details Name: [name] MRN: [id number] DOB: [birth year] Today's Date: 09/11/2012 Time: 6295-2841 OT Time Calculation (min): 17 min  OT Assessment / Plan / Recommendation  OT comments  Pt did well with AE practice. Will benefit from [facility] to progress ADL independence and for tub transfer training.   Follow Up Recommendations  Supervision/Assistance - 24 hour;Home health OT    Barriers to Discharge       Equipment Recommendations  None recommended by OT    Recommendations for Other Services    Frequency Min 2X/week   Progress towards OT Goals Progress towards OT goals: Progressing toward goals  Plan Discharge plan remains appropriate    Precautions / Restrictions Precautions Precautions: Fall Precaution Comments: h/ falls at home Restrictions Weight Bearing Restrictions: No Other Position/Activity Restrictions: WBAT        ADL  Upper Body Dressing:  (pt performed) Lower Body Dressing: Performed;Moderate assistance (see notes below) Where Assessed - Lower Body Dressing: Supported sit to stand ADL Comments: Pt's wife and grandson present for session. Lucila Maine will be able to assist at home with ADL. Pt practiced with AE for LB ADL. He doesnt have a long handle sponge or shoe horn but educated pt and family on where to obtain one. Discussed use of 3in1 over toilet for safety and gradually work toward higher  toilet as he gets stronger. Discussed use of RW at all times including to stand and pull up clothing. Pt did well with AE but was limited by catheter present when trying to thread pants over feet. He also had some difficulty getting R foot in shoe (may not have loosened velcro straps enough). Overall he feels comfortable with AE and states he just needs to practice more with it. Recommend HHOT to increase independence with ADL and address walk in tub transfer. Pt verbalizes understanding to wait for [facility] to assess before transferring into  shower on his own with family.      OT Diagnosis:    OT Problem List:   OT Treatment Interventions:     OT Goals(current goals can now be found in the care plan section) Acute Rehab OT Goals Patient Stated Goal: Return home and regain independence  Visit Information  Last OT Received On: 09/11/12 Assistance Needed: +1 History of Present Illness: 79 y.o. male with h/o Lt. anterior THA, admitted for  Rt. THA direct anterior approach    Subjective Data      Prior Functioning       Cognition  Cognition Arousal/Alertness: Awake/alert Behavior During Therapy: WFL for tasks assessed/performed Overall Cognitive Status: Within Functional Limits for tasks assessed    Mobility  Transfers Transfers: Sit to Stand;Stand to Sit Sit to Stand: 4: Min guard;With upper extremity assist;From chair/3-in-1 Stand to Sit: 4: Min guard;With upper extremity assist;To chair/3-in-1    Exercises      Balance Balance Balance Assessed: Yes Dynamic Standing Balance Dynamic Standing - Level of Assistance: 4: Min assist (min guard)   End of Session OT - End of Session Equipment Utilized During Treatment: Other (comment);Rolling walker (AE) Activity Tolerance: Patient tolerated treatment well Patient left: in chair;with call bell/phone within reach;with family/visitor present  GO     Lennox Laity [phone] 09/11/2012, 11:06 AM

## 2012-09-14 ENCOUNTER — Telehealth: Payer: Self-pay | Admitting: Internal Medicine

## 2012-09-14 MED ORDER — PREDNISONE 10 MG PO TABS: ORAL_TABLET | ORAL | Status: DC

## 2012-09-14 NOTE — Telephone Encounter (Signed)
Pt is aware of MW recs. Prednisone will be sent to his pharmacy. He will call back next week to set up ROV with MW.

## 2012-09-14 NOTE — Telephone Encounter (Signed)
cipro is strongler than than doxy, should not take both If not improving rec Prednisone 10 mg take  4 each am x 2 days,   2 each am x 2 days,  1 each am x 2 days and stop then ov

## 2012-09-14 NOTE — Telephone Encounter (Signed)
Last OV 07-11-11, COPD pt. I spoke with the pt and his spouse and the pt had hip surgery x 1 week ago so is unable to come in at this time. Pt states he ahs developed a productive cough with yellow phlegm, chest congestion, and wheezing. He has a Education officer, environmental that comes to his home sicne surgery and she states she heard the wheezing when she evaluated his lungs today. He is using an incentive spirometry. Pt states last night he realized that he had not been taking his protonix or mucinex since surgery so started back on both of those last night and has been able to cough up more congestion. The pt is currently taking Cipro for a UTI issue after surgery so he wanted to know would this be enough for his lung issues as well? He has a prescription on hand for doxycycline given to him by Dr. Sherene Sires to have on hand in case of an exacerbation. The pt wants to know should he continue the cipro, take doxy, take both? Please advise. Carron Curie, CMA Pt is aware he needs to make an appt as soon as he is able.  Allergies  Allergen Reactions  . Lortab (Hydrocodone-Acetaminophen) Nausea And Vomiting  . Penicillins Other (See Comments)    Light headed  . Zetia (Ezetimibe)     Muscle weakness  . Zocor (Simvastatin) Other (See Comments)    Nausea, muscle weakness

## 2012-09-17 ENCOUNTER — Other Ambulatory Visit (HOSPITAL_COMMUNITY): Payer: Self-pay | Admitting: Orthopaedic Surgery

## 2012-09-17 ENCOUNTER — Ambulatory Visit (HOSPITAL_COMMUNITY)
Admission: RE | Admit: 2012-09-17 | Discharge: 2012-09-17 | Disposition: A | Discharge status: Home / Self Care | Payer: Medicare Other | Source: Ambulatory Visit | Attending: Orthopaedic Surgery | Admitting: Orthopaedic Surgery

## 2012-09-17 DIAGNOSIS — J449 Chronic obstructive pulmonary disease, unspecified: Secondary | ICD-10-CM

## 2012-09-17 DIAGNOSIS — M7989 Other specified soft tissue disorders: Secondary | ICD-10-CM

## 2012-09-17 DIAGNOSIS — M25569 Pain in unspecified knee: Secondary | ICD-10-CM | POA: Insufficient documentation

## 2012-09-17 DIAGNOSIS — M25561 Pain in right knee: Secondary | ICD-10-CM

## 2012-09-17 DIAGNOSIS — J4489 Other specified chronic obstructive pulmonary disease: Secondary | ICD-10-CM | POA: Insufficient documentation

## 2012-09-17 DIAGNOSIS — D126 Benign neoplasm of colon, unspecified: Secondary | ICD-10-CM | POA: Insufficient documentation

## 2012-09-17 DIAGNOSIS — M79609 Pain in unspecified limb: Secondary | ICD-10-CM

## 2012-09-17 NOTE — Progress Notes (Signed)
Right lower extremity venous duplex completed.  Right:  No evidence of DVT, superficial thrombosis, or Baker's cyst.  Left:  Negative for DVT in the common femoral vein.  

## 2012-09-20 ENCOUNTER — Encounter (HOSPITAL_COMMUNITY): Payer: Self-pay | Admitting: *Deleted

## 2012-09-20 ENCOUNTER — Emergency Department (HOSPITAL_COMMUNITY)
Admission: EM | Admit: 2012-09-20 | Discharge: 2012-09-20 | Disposition: A | Discharge status: Home / Self Care | Payer: Medicare Other | Attending: Emergency Medicine | Admitting: Emergency Medicine

## 2012-09-20 ENCOUNTER — Emergency Department (HOSPITAL_COMMUNITY): Payer: Medicare Other

## 2012-09-20 DIAGNOSIS — Z79899 Other long term (current) drug therapy: Secondary | ICD-10-CM | POA: Insufficient documentation

## 2012-09-20 DIAGNOSIS — Z87448 Personal history of other diseases of urinary system: Secondary | ICD-10-CM | POA: Insufficient documentation

## 2012-09-20 DIAGNOSIS — E039 Hypothyroidism, unspecified: Secondary | ICD-10-CM | POA: Insufficient documentation

## 2012-09-20 DIAGNOSIS — R0789 Other chest pain: Secondary | ICD-10-CM | POA: Insufficient documentation

## 2012-09-20 DIAGNOSIS — Z880 Allergy status to penicillin: Secondary | ICD-10-CM | POA: Insufficient documentation

## 2012-09-20 DIAGNOSIS — R0600 Dyspnea, unspecified: Secondary | ICD-10-CM

## 2012-09-20 DIAGNOSIS — M129 Arthropathy, unspecified: Secondary | ICD-10-CM | POA: Insufficient documentation

## 2012-09-20 DIAGNOSIS — Z8709 Personal history of other diseases of the respiratory system: Secondary | ICD-10-CM | POA: Insufficient documentation

## 2012-09-20 DIAGNOSIS — Z9861 Coronary angioplasty status: Secondary | ICD-10-CM | POA: Insufficient documentation

## 2012-09-20 DIAGNOSIS — R059 Cough, unspecified: Secondary | ICD-10-CM | POA: Insufficient documentation

## 2012-09-20 DIAGNOSIS — I2510 Atherosclerotic heart disease of native coronary artery without angina pectoris: Secondary | ICD-10-CM | POA: Insufficient documentation

## 2012-09-20 DIAGNOSIS — Z9889 Other specified postprocedural states: Secondary | ICD-10-CM | POA: Insufficient documentation

## 2012-09-20 DIAGNOSIS — Z7982 Long term (current) use of aspirin: Secondary | ICD-10-CM | POA: Insufficient documentation

## 2012-09-20 DIAGNOSIS — K219 Gastro-esophageal reflux disease without esophagitis: Secondary | ICD-10-CM | POA: Insufficient documentation

## 2012-09-20 DIAGNOSIS — D649 Anemia, unspecified: Secondary | ICD-10-CM | POA: Insufficient documentation

## 2012-09-20 DIAGNOSIS — J441 Chronic obstructive pulmonary disease with (acute) exacerbation: Secondary | ICD-10-CM | POA: Insufficient documentation

## 2012-09-20 DIAGNOSIS — R05 Cough: Secondary | ICD-10-CM | POA: Insufficient documentation

## 2012-09-20 LAB — CBC
HCT: 33.1 % — ABNORMAL LOW (ref 39.0–52.0)
Hemoglobin: 10.8 g/dL — ABNORMAL LOW (ref 13.0–17.0)
MCH: 30.9 pg (ref 26.0–34.0)
MCHC: 32.6 g/dL (ref 30.0–36.0)
MCV: 94.6 fL (ref 78.0–100.0)
Platelets: 377 10*3/uL (ref 150–400)
RBC: 3.5 MIL/uL — ABNORMAL LOW (ref 4.22–5.81)
RDW: 16.6 % — ABNORMAL HIGH (ref 11.5–15.5)
WBC: 18.1 10*3/uL — ABNORMAL HIGH (ref 4.0–10.5)

## 2012-09-20 LAB — COMPREHENSIVE METABOLIC PANEL
ALT: 21 U/L (ref 0–53)
AST: 29 U/L (ref 0–37)
Albumin: 3 g/dL — ABNORMAL LOW (ref 3.5–5.2)
Alkaline Phosphatase: 96 U/L (ref 39–117)
BUN: 12 mg/dL (ref 6–23)
CO2: 25 mEq/L (ref 19–32)
Calcium: 8.8 mg/dL (ref 8.4–10.5)
Chloride: 100 mEq/L (ref 96–112)
Creatinine, Ser: 0.87 mg/dL (ref 0.50–1.35)
GFR calc Af Amer: >90 mL/min (ref >90)
GFR calc non Af Amer: 81 mL/min — ABNORMAL LOW (ref >90)
Glucose, Bld: 94 mg/dL (ref 70–99)
Potassium: 4 mEq/L (ref 3.5–5.1)
Sodium: 135 mEq/L (ref 135–145)
Total Bilirubin: 0.8 mg/dL (ref 0.3–1.2)
Total Protein: 6.4 g/dL (ref 6.0–8.3)

## 2012-09-20 LAB — URINALYSIS, ROUTINE W REFLEX MICROSCOPIC
Bilirubin Urine: NEGATIVE
Glucose, UA: NEGATIVE mg/dL
Hgb urine dipstick: NEGATIVE
Ketones, ur: NEGATIVE mg/dL
Leukocytes, UA: NEGATIVE
Nitrite: NEGATIVE
Protein, ur: NEGATIVE mg/dL
Specific Gravity, Urine: 1.015 (ref 1.005–1.030)
Urobilinogen, UA: 1 mg/dL (ref 0.0–1.0)
pH: 6.5 (ref 5.0–8.0)

## 2012-09-20 LAB — PRO B NATRIURETIC PEPTIDE: Pro B Natriuretic peptide (BNP): 567.3 pg/mL — ABNORMAL HIGH (ref 0–450)

## 2012-09-20 LAB — TROPONIN I: Troponin I: <0.3 ng/mL (ref <0.30)

## 2012-09-20 MED ORDER — IOHEXOL 350 MG/ML SOLN
100.0000 mL | Freq: Once | INTRAVENOUS | Status: AC | PRN
  Administered 2012-09-20: 100 mL via INTRAVENOUS

## 2012-09-20 NOTE — ED Notes (Signed)
Pt became short of breath while standing to urinate (pt insists on standing); Pt's HR elevated to 115, respirations increased to 30's and pulse-ox decreased to high 80's--> increased oxygen to 3L--> Dr. Gordy Savers.

## 2012-09-20 NOTE — ED Notes (Signed)
Dressed pt's wound after cleansing with NS.  Used Xeroform (as Dr. Eliberto Ivory PA instructed) on pt's wound covered with two 4x4's with curlex.

## 2012-09-20 NOTE — ED Provider Notes (Addendum)
CSN: [id number]     Arrival date & time 09/20/12  1437 History     First MD Initiated Contact with Patient 09/20/12 1529     Chief Complaint  Patient presents with  . Leg Swelling   (Consider location/radiation/quality/duration/timing/severity/associated sxs/prior Treatment) HPI Complains of shortness of breath andchest tightness onset approximately 10 days ago accompanied by bilateral leg swelling since he had right hip replacement July 11. Dyspnea and chest tightness with lying supine and improved with sitting upright. He presently feels well. Shortness of breath and chest is minimal at present. Symptoms accompanied by slight cough. Patient sent here from Dr. Eliberto Ivory office earlier today where he had a blister drained from his right calf. No other treatment prior to coming here. Past Medical History  Diagnosis Date  . Emphysema   . CAD (coronary artery disease)     left main had  a 20% stenosis.  Left anterior descending had 40-50% proximal   stenosis.  Left circumflex had an 80% proximal stenosis.  The  first obtuse marginal artery had a 60% mid stenosis.  The  right  coronary artery  had  a 50-60% proximal stenosis with a 60-70%  mid stenosis  The patient was managed medically.   Marland Kitchen COPD (chronic obstructive pulmonary disease)   . Hypothyroidism   . Anemia   . Blood transfusion june 2011, 5 units given    july 2011 some units given  . Enlarged prostate     elevated psa recently  . GERD (gastroesophageal reflux disease)   . Arthritis   . Complication of anesthesia     likes spinal due to copd  . Dizziness     occasional  . Shortness of breath     with exertion   Past Surgical History  Procedure Laterality Date  . Coronary stent placement  5 yrs ago  . Hiatal hernia repair  01-04-2008  . Foot surgery  1994 left, 2002 right foot    bilateral foot reconstruciton  . Abdominal hernia repair   2008  . Cataract extraction both eyes    . Total hip arthroplasty  07/21/2011     Procedure: TOTAL HIP ARTHROPLASTY ANTERIOR APPROACH;  Surgeon: [name], MD;  Location: [facility];  Service: Orthopedics;  Laterality: Left;  . Coronary angioplasty    . Total hip arthroplasty Right 09/07/2012    Procedure: RIGHT TOTAL HIP ARTHROPLASTY ANTERIOR APPROACH;  Surgeon: Kathryne Hitch, MD;  Location: [facility];  Service: Orthopedics;  Laterality: Right;   Family History  Problem Relation Age of Onset  . Heart disease Sister   . Heart failure Father   . Colon cancer Neg Hx    History  Substance Use Topics  . Smoking status: Never Smoker   . Smokeless tobacco: Never Used  . Alcohol Use: No    Review of Systems  Respiratory: Positive for cough, chest tightness and shortness of breath.   Cardiovascular: Positive for leg swelling.  Skin: Positive for wound.  All other systems reviewed and are negative.    Allergies  Lortab; Penicillins; Zetia; and Zocor  Home Medications   Current Outpatient Rx  Name  Route  Sig  Dispense  Refill  . albuterol (PROVENTIL HFA;VENTOLIN HFA) 108 (90 BASE) MCG/ACT inhaler   Inhalation   Inhale 2 puffs into the lungs every 6 (six) hours as needed for wheezing or shortness of breath.         Marland Kitchen aspirin EC 325 MG EC tablet   Oral  Take 1 tablet (325 mg total) by mouth 2 (two) times daily after a meal.   50 tablet   0   . bromocriptine (PARLODEL) 2.5 MG tablet   Oral   Take 2.5 mg by mouth at bedtime.         . budesonide-formoterol (SYMBICORT) 160-4.5 MCG/ACT inhaler   Inhalation   Inhale 2 puffs into the lungs 2 (two) times daily.         . cholecalciferol (VITAMIN D) 1000 UNITS tablet   Oral   Take 1,000 Units by mouth 2 (two) times daily.         . ferrous sulfate 325 (65 FE) MG tablet   Oral   Take 325 mg by mouth daily.          Marland Kitchen guaiFENesin (MUCINEX) 600 MG 12 hr tablet   Oral   Take 600 mg by mouth 2 (two) times daily.          Marland Kitchen levothyroxine (SYNTHROID, LEVOTHROID) 50 MCG tablet   Oral   Take  50 mcg by mouth every morning.          . methocarbamol (ROBAXIN) 500 MG tablet   Oral   Take 500 mg by mouth 4 (four) times daily as needed (pain).         . montelukast (SINGULAIR) 10 MG tablet   Oral   Take 10 mg by mouth daily as needed (for allergies).          . Multiple Vitamin (MULITIVITAMIN WITH MINERALS) TABS   Oral   Take 1 tablet by mouth every evening.         . nitroGLYCERIN (NITROSTAT) 0.4 MG SL tablet   Sublingual   Place 0.4 mg under the tongue every 5 (five) minutes as needed for chest pain.         . pantoprazole (PROTONIX) 40 MG tablet   Oral   Take 40 mg by mouth every morning.         . tamsulosin (FLOMAX) 0.4 MG CAPS   Oral   Take 1 capsule (0.4 mg total) by mouth daily.   10 capsule   0   . traMADol (ULTRAM) 50 MG tablet   Oral   Take 50 mg by mouth every 6 (six) hours as needed for pain.         . ciprofloxacin (CIPRO) 500 MG tablet   Oral   Take 1 tablet (500 mg total) by mouth 2 (two) times daily.   10 tablet   0    BP 143/68  Pulse 101  Temp(Src) 98.3 F (36.8 C) (Oral)  Resp 23  SpO2 99% Physical Exam  Nursing note and vitals reviewed. Constitutional: He is oriented to person, place, and time. He appears well-developed and well-nourished.  HENT:  Head: Normocephalic and atraumatic.  Eyes: Conjunctivae are normal. Pupils are equal, round, and reactive to light.  Neck: Neck supple. No tracheal deviation present. No thyromegaly present.  Mild JVD  Cardiovascular: Normal rate and regular rhythm.   No murmur heard. Pulmonary/Chest: Effort normal.  Diffuse rhonchi. Speaks in paragraphs no respiratory distress  Abdominal: Soft. Bowel sounds are normal. He exhibits no distension. There is no tenderness.  Musculoskeletal: Normal range of motion. He exhibits edema. He exhibits no tenderness.  Right lower extremity there is a baseball size flattened ecchymosis at the mid calf. Stapled well-appearing surgical and hip.  Ecchymotic at the right buttoc and lower back Bilateral lower extremities with 3+ pretibial  pitting edema.  Lymphadenopathy:    He has no cervical adenopathy.  Neurological: He is alert and oriented to person, place, and time. Coordination normal.  Skin: Skin is warm and dry. No rash noted.  Psychiatric: He has a normal mood and affect.    ED Course   Procedures (including critical care time)  Labs Reviewed  CBC - Abnormal; Notable for the following:    WBC 18.1 (*)    RBC 3.50 (*)    Hemoglobin 10.8 (*)    HCT 33.1 (*)    RDW 16.6 (*)    All other components within normal limits  PRO B NATRIURETIC PEPTIDE  TROPONIN I  COMPREHENSIVE METABOLIC PANEL  URINALYSIS, ROUTINE W REFLEX MICROSCOPIC   No results found. No diagnosis found.  Date: 09/20/2012  Rate: 90  Rhythm: normal sinus rhythm  QRS Axis: normal  Intervals: normal  ST/T Wave abnormalities: nonspecific ST/T changes  Conduction Disutrbances:none  Narrative Interpretation:   Old EKG Reviewed: Rate increased from 09/03/2012 otherwise no significant change interpreted by me 7:30 PM patient is resting comfortably. No respiratory distress. He feels radial home.  Results for orders placed during the hospital encounter of 09/20/12  PRO B NATRIURETIC PEPTIDE      Result Value Range   Pro B Natriuretic peptide (BNP) 567.3 (*) 0 - 450 pg/mL  TROPONIN I      Result Value Range   Troponin I <0.30  <0.30 ng/mL  CBC      Result Value Range   WBC 18.1 (*) 4.0 - 10.5 K/uL   RBC 3.50 (*) 4.22 - 5.81 MIL/uL   Hemoglobin 10.8 (*) 13.0 - 17.0 g/dL   HCT 16.1 (*) 09.6 - 04.5 %   MCV 94.6  78.0 - 100.0 fL   MCH 30.9  26.0 - 34.0 pg   MCHC 32.6  30.0 - 36.0 g/dL   RDW 40.9 (*) 81.1 - 91.4 %   Platelets 377  150 - 400 K/uL  COMPREHENSIVE METABOLIC PANEL      Result Value Range   Sodium 135  135 - 145 mEq/L   Potassium 4.0  3.5 - 5.1 mEq/L   Chloride 100  96 - 112 mEq/L   CO2 25  19 - 32 mEq/L   Glucose, Bld 94  70 - 99  mg/dL   BUN 12  6 - 23 mg/dL   Creatinine, Ser 7.82  0.50 - 1.35 mg/dL   Calcium 8.8  8.4 - 95.6 mg/dL   Total Protein 6.4  6.0 - 8.3 g/dL   Albumin 3.0 (*) 3.5 - 5.2 g/dL   AST 29  0 - 37 U/L   ALT 21  0 - 53 U/L   Alkaline Phosphatase 96  39 - 117 U/L   Total Bilirubin 0.8  0.3 - 1.2 mg/dL   GFR calc non Af Amer 81 (*) >90 mL/min   GFR calc Af Amer >90  >90 mL/min  URINALYSIS, ROUTINE W REFLEX MICROSCOPIC      Result Value Range   Color, Urine YELLOW  YELLOW   APPearance CLEAR  CLEAR   Specific Gravity, Urine 1.015  1.005 - 1.030   pH 6.5  5.0 - 8.0   Glucose, UA NEGATIVE  NEGATIVE mg/dL   Hgb urine dipstick NEGATIVE  NEGATIVE   Bilirubin Urine NEGATIVE  NEGATIVE   Ketones, ur NEGATIVE  NEGATIVE mg/dL   Protein, ur NEGATIVE  NEGATIVE mg/dL   Urobilinogen, UA 1.0  0.0 - 1.0 mg/dL  Nitrite NEGATIVE  NEGATIVE   Leukocytes, UA NEGATIVE  NEGATIVE   Dg Chest 2 View  09/20/2012   *RADIOLOGY REPORT*  Clinical Data: Leg swelling  CHEST - 2 VIEW  Comparison: 09/03/2012  Findings: Heart size is normal.  There is no pleural effusion or edema.  There is asymmetric elevation of the left hemidiaphragm. Coarsened interstitial markings are noted bilaterally and appear similar to previous exam.  There are chronic left posterior rib deformities which appears stable from previous exam.  IMPRESSION:  1.  No acute cardiopulmonary abnormalities. 2.  Chronic interstitial coarsening is similar to previous exam.   Original Report Authenticated By: Signa Kell, M.D.   Dg Chest 2 View  09/03/2012   *RADIOLOGY REPORT*  Clinical Data: Preop for right total hip arthroplasty.  CHEST - 2 VIEW  Comparison: Chest and rib films 04/25/2012.  Findings: The heart size is normal.  The lungs are clear.  Chronic elevation of the posterior left hemidiaphragm is again noted. Emphysematous changes are evident.  A remote superior endplate compression fracture at T12 is stable.  Remote left-sided rib fractures are again noted.   IMPRESSION:  1.  No acute cardiopulmonary disease or significant interval change. 2.  Emphysema.   Original Report Authenticated By: Marin Roberts, M.D.   Dg Hip Complete Right  09/07/2012   *RADIOLOGY REPORT*  Clinical Data: Right hip replacement.  DG C-ARM 61-120 MIN - [facility]PT [facility], RIGHT HIP - COMPLETE 2+ VIEW  Comparison: Single view of the pelvis 07/21/2011.  Findings: Single fluoroscopic spot view of the right hip is provided.  A new right total hip arthroplasty is in place. Surgical wound is noted.  The device appears located and no fracture is identified.  IMPRESSION: Right total hip replacement.   Original Report Authenticated By: Holley Dexter, M.D.   Ct Angio Chest Pe W/cm &/or Wo Cm  09/20/2012   *RADIOLOGY REPORT*  Clinical Data: Right hip surgery (09/07/2012), now with chest tightness and lower extremity swelling, evaluate for pulmonary embolism  CT ANGIOGRAPHY CHEST  Technique:  Multidetector CT imaging of the chest using the standard protocol during bolus administration of intravenous contrast. Multiplanar reconstructed images including MIPs were obtained and reviewed to evaluate the vascular anatomy.  Contrast: OMNIPAQUE IOHEXOL 350 MG/ML SOLN  Comparison: Chest CT - 07/22/2010; Chest radiograph - 09/20/2012  Vascular Findings:  There is suboptimal opacification of the pulmonary arterial system with the main pulmonary artery measuring 206 HU.  There are no discrete filling defects within the bilateral pulmonary arterial tree to the level of the bilateral segmental pulmonary arteries. Evaluation the distal subsegmental pulmonary arteries is degraded secondary to suboptimal vessel opacification.  Normal caliber of the main pulmonary artery.  Normal heart size.  Coronary artery calcifications.  No pericardial effusion.  There is grossly unchanged mild fusiform aneurysmal dilatation of the ascending thoracic aorta measuring approximately 45 mm in greatest oblique short axis axial  dimension (image 44, series four).  The aorta appears to taper to a near normal caliber at the level of the aortic arch.  Conventional configuration of the aortic arch.  Scattered atherosclerotic plaque within a slightly tortuous but normal caliber descending thoracic aorta.  No definite thoracic aortic dissection or periaortic stranding.  -------------------------------------------------------  Nonvascular findings:  There has been interval worsening of previously noted bronchial wall thickening and mucoid impaction within multiple basilar segmental and subsegmental bronchi in the bilateral lower lobes. These findings are associated with scattered tree by nodular opacities, most conspicuous in the left  lower lobe.  There is minimal subsegmental atelectasis within the medial aspect the right lower lobe.  The remaining central airways are patent.  No pleural effusion or pneumothorax. No definite evidence of edema. No mediastinal, hilar or axillary lymphadenopathy.  Early arterial phase evaluation of the upper abdomen demonstrates postsurgical change of the gastric fundus.  No acute or aggressive osseous abnormalities.  Old/healed left sided posterior rib fractures.  IMPRESSION:  1.  Negative for pulmonary embolism to the level of the bilateral subsegmental pulmonary arteries. 2.  Grossly unchanged fusiform aneurysmal dilatation of the ascending thoracic aorta measuring approximately 45 mm in diameter, similar to the 06/2010 examination. 3.  Coronary artery calcifications. 4.  Interval worsening of previously noted bilateral lower lobe bronchial wall thickening and mucoid impaction worrisome for continued aspiration.  Further evaluation with formal speech swallow examination may be performed as clinically indicated.   Original Report Authenticated By: Tacey Ruiz, MD   Dg Pelvis Portable  09/07/2012   *RADIOLOGY REPORT*  Clinical Data: Status post hip replacement.  PORTABLE PELVIS  Comparison: Single view of the  pelvis 07/21/2011.  Findings: The patient has a new right total hip arthroplasty.  The device is located.  There is no fracture.  Gas in the soft tissues from surgery is noted.  Left hip replacement is again seen.  IMPRESSION: Right total hip replacement without complication.  No acute finding.   Original Report Authenticated By: Holley Dexter, M.D.   Dg C-arm 61-120 Min-no Report  09/07/2012   *RADIOLOGY REPORT*  Clinical Data: Right hip replacement.  DG C-ARM 61-120 MIN - [facility], RIGHT HIP - COMPLETE 2+ VIEW  Comparison: Single view of the pelvis 07/21/2011.  Findings: Single fluoroscopic spot view of the right hip is provided.  A new right total hip arthroplasty is in place. Surgical wound is noted.  The device appears located and no fracture is identified.  IMPRESSION: Right total hip replacement.   Original Report Authenticated By: Holley Dexter, M.D.    MDM  No evidence of pulmonary embolism or congestive heart failure. Patient encouraged to use albuterol inhaler 2 puffs every 4 hours as needed for shortness of breath. Return if a more than 4 hours. He has a plan with Dr.Moore on 09/26/2012. He should request a swallowing study to rule out chronic aspiration.  Dx dyspnea  Doug Sou, MD 09/20/12 8119  Doug Sou, MD 09/23/12 1352

## 2012-09-20 NOTE — ED Notes (Signed)
Patient transported to CT 

## 2012-09-20 NOTE — ED Notes (Signed)
Patient transported to X-ray 

## 2012-09-20 NOTE — ED Notes (Signed)
Pt had right hip replacement surgery July 11. Has since developed chest tightness, bilateral leg edema, has gained weight since surgery, and has drained abscess to right calf which is wrapped in dressing. Pt sent from pcp to be evaluated for possible CHF

## 2012-09-20 NOTE — ED Notes (Signed)
Dr. Shela Commons requested removal of leg bandage to see wound.  Did as instructed and placed sterile gauze over wound so that Hospitalist can view as well if needed.  Will rebandage afterwards.

## 2012-09-24 ENCOUNTER — Encounter: Payer: Self-pay | Admitting: Internal Medicine

## 2012-09-24 ENCOUNTER — Ambulatory Visit (INDEPENDENT_AMBULATORY_CARE_PROVIDER_SITE_OTHER): Payer: Medicare Other | Admitting: Internal Medicine

## 2012-09-24 VITALS — BP 122/80 | HR 112 | Temp 97.8°F | Ht 66.0 in | Wt 169.0 lb

## 2012-09-24 DIAGNOSIS — J449 Chronic obstructive pulmonary disease, unspecified: Secondary | ICD-10-CM

## 2012-09-24 NOTE — Progress Notes (Signed)
Subjective:    Patient ID: [name], male    DOB: [birth year]   MRN: [id number]    Brief patient profile:  80   yowm quit smoking completely around 1980 (relatively light) with cough and short of breath eval by  Dr Sheppard Penton  Then [name] dx of copd/ cb  pft's showed fev1 116% 02/2010 though ratio 66 c/w GOLD I severity.   07/05/2010 ov cc recurrent pna's since June 2011 cc not back to baseline in terms of activities he enjoyed in May, for example  Could do some yardwork  and rarely  Needed saba and no need for any maint rx,   but on  spriva for sob and still frequent tightness generalized front more than back,  Equal both sides  low grade fever and mucus gets thick and yellow> admit to [facility] April 24-27 by [facility] dx of ? Pna.   No sob at rest.   Continue protonix 40 mg  Take 30-60 min before first meal of the day and Pepcid 20 mg at bedtime GERD (REFLUX) diet Try symbicort 160 Take 2 puffs bid and work on hfa technique  08/24/2010 ov/[facility]/[name] cc breathing and cough better despite L rib fx 07/22/10. Main concern is how to prevent future exacerbations.  No limiting sob but not as active since quit smoking. Doing so much better overall rec  Ok to stop spiriva unless it reduces your desired activity performance (it's like high octane fuel for your lungs so your can be all you can be) Continue symbicort 160 Take 2 puffs first thing in am and then another 2 puffs about 12 hours later.    In event of recurrent signs of infection:  Change in sputum, fever, cough, short of breath or sore throat go ahead and take a course of doxycycline - if this doesn't help we need to see you in [place] ([facility]/ [name]) F/u prn   04/05/2011 f/u ov/[facility] reports one use of doxy cc breathing/ coughing "the best it's been in years" on ppi qam and prn pepcid at hs and symbicort 160 2 qam and 2qhs prn and no need for albuterol. rec No change in rx F/u prn   07/12/2011 f/u ov/[facility] cc dyspnea and cough "the best they've  been in years"  here for preop eval for L hip on 23rd May by Charlann Boxer,  used 2 rounds of abx since last ov for purulent sputum which cleared  and symbicort 2 bid and one mucinex and generally sleeping well,  But since pollen season increased sub wheezing so started singulair > resolved.  rec Ok for surgery  Between now and then be sure to continue the singulair each day and Pepcid each night.    09/24/2012 f/u ov/[facility] doxy 4/10  Chief Complaint  Patient presents with  . Follow-up    Pt states that overall his breathing is doing okay- he had hip replaced-rt on 09/07/12 and afterwards had some respiratory symptoms that are now clearing up taking doxycycline.Still has occ prod cough with light yellow sputum.    no singulair or pepcid at present, not on symbicort either, misunderstood instructions/ contingencies Was takking albuterol up to every 4 hours and now down to twice daily with relief of cough and sob   No obvious daytime variabilty or assoc   chest tightness, subjective wheeze overt sinus or hb symptoms. No unusual exp hx or h/o childhood pna/ asthma or knowledge of premature birth.    Sleeping ok without nocturnal  or  early am exacerbation  of respiratory  c/o's or need for noct saba. Also denies any obvious fluctuation of symptoms with weather or environmental changes or other aggravating or alleviating factors except as outlined above.  Current Medications, Allergies, Past Medical History, Past Surgical History, Family History, and Social History were reviewed in [employer] record.  ROS  The following are not active complaints unless bolded sore throat, dysphagia, dental problems, itching, sneezing,  nasal congestion or excess/ purulent secretions, ear ache,   fever, chills, sweats, unintended wt loss, pleuritic or exertional cp, hemoptysis,  orthopnea pnd or leg swelling, presyncope, palpitations, heartburn, abdominal pain, anorexia, nausea, vomiting, diarrhea  or  change in bowel or urinary habits, change in stools or urine, dysuria,hematuria,  rash, arthralgias, visual complaints, headache, numbness weakness or ataxia or problems with walking  Improving s/p hip surgery, or coordination,  change in mood/affect or memory.             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 Bonchiectasis RLL- CT 2009 > not verified on CT 07/22/10   Past Surgical History:  Inguinal herniorrhaphy  PTCA/stent  LEFT & RIGHT FOOT RECONSTRUCTION  hiatal hernia repair 2009   Family History:  neg for pituitary dz  mother died age 25 from phlebitis  father died age 40 from heart failure  8 siblings  alive age 44,80,89,92  1 died age 43 heart failure  3 died age 17,76,90 from heart problems    Social History:  married  2 children  retiired  never smoked  no etoh         Objective:   Physical Exam [place] amb wm nad wt 167 07/05/2010  > 168 08/24/2010 > 04/05/2011 174> 07/12/2011  176> 09/24/2012  169  HEENT mild turbinate edema.  Oropharynx no thrush or excess pnd or cobblestoning.  No JVD or cervical adenopathy. Min accessory muscle hypertrophy. Trachea midline, nl thryroid. Chest was min hyperinflated by percussion with diminished breath sounds and moderate increased exp time with min exp bilteral sonorous rhonchi. Hoover sign positive at end inspiration. Regular rate and rhythm without murmur gallop or rub or increase P2 or edema.  Abd: no hsm, nl excursion. Ext warm without cyanosis or clubbing.         CTa chest 09/20/12  1. Negative for pulmonary embolism to the level of the bilateral  subsegmental pulmonary arteries.  2. Grossly unchanged fusiform aneurysmal dilatation of the  ascending thoracic aorta measuring approximately 45 mm in diameter,  similar to the 06/2010 examination.  3. Coronary artery calcifications.  4. Interval worsening of previously noted bilateral lower lobe  bronchial wall thickening and mucoid impaction worrisome for  continued aspiration. Further evaluation with formal speech  swallow examination may be performed as clinically indicated. .   Assessment & Plan:

## 2012-09-24 NOTE — Patient Instructions (Addendum)
Plan A = automatic = symbicort 160 2puffs each am and protonix 40 mg Take 30-60 min before first meal of the day    For cough and congestion, mucinex dose = 1200 mg every 12 hours and add pain killer to the mucinex if needed  For nasty mucus > doxy x 10 days  For any respiratory symptom flare, take max dose of symbiocort 160 Take 2 puffs first thing in am and then another 2 puffs about 12 hours later.    and singulair and pepcid 20 mg at bedtime.   Only use your albuterol as a rescue medication to be used if you can't catch your breath by resting or doing a relaxed purse lip breathing pattern. The less you use it, the better it will work when you need it. Ok to use up to 2 puffs every 4 hours

## 2012-09-25 NOTE — Assessment & Plan Note (Addendum)
-   Spirometry 03/30/2010 FEV1  2.86 (116%) ratio 66    DDX of  difficult airways managment all start with A and  include Adherence, Ace Inhibitors, Acid Reflux, Active Sinus Disease, Alpha 1 Antitripsin deficiency, Anxiety masquerading as Airways dz,  ABPA,  allergy(esp in young), Aspiration (esp in elderly), Adverse effects of DPI,  Active smokers, plus two Bs  = Bronchiectasis and Beta blocker use..and one C= CHF  Adherence is always the initial "prime suspect" and is a multilayered concern that requires a "trust but verify" approach in every patient - starting with knowing how to use medications, especially inhalers, correctly, keeping up with refills and understanding the fundamental difference between maintenance and prns vs those medications only taken for a very short course and then stopped and not refilled. The proper method of use, as well as anticipated side effects, of a metered-dose inhaler are discussed and demonstrated to the patient. Improved effectiveness after extensive coaching during this visit to a level of approximately  95%   I had an extended discussion with the patient today lasting 15 to 20 minutes of a 25 minute visit on the following issues:  Because his dz is so mild he needs to understand how the difference between baseline maint recs and contingency planning    Each maintenance medication was reviewed in detail including most importantly the difference between maintenance and as needed and under what circumstances the prns are to be used.  Please see instructions for details which were reviewed in writing and the patient given a copy.

## 2012-09-26 ENCOUNTER — Encounter: Payer: Self-pay | Admitting: Family Medicine

## 2012-09-26 ENCOUNTER — Ambulatory Visit (INDEPENDENT_AMBULATORY_CARE_PROVIDER_SITE_OTHER): Payer: Medicare Other | Admitting: Family Medicine

## 2012-09-26 VITALS — BP 135/74 | HR 89 | Temp 97.2°F | Ht 66.5 in | Wt 168.0 lb

## 2012-09-26 DIAGNOSIS — E559 Vitamin D deficiency, unspecified: Secondary | ICD-10-CM

## 2012-09-26 DIAGNOSIS — I959 Hypotension, unspecified: Secondary | ICD-10-CM

## 2012-09-26 DIAGNOSIS — E785 Hyperlipidemia, unspecified: Secondary | ICD-10-CM

## 2012-09-26 DIAGNOSIS — R5383 Other fatigue: Secondary | ICD-10-CM

## 2012-09-26 DIAGNOSIS — J4489 Other specified chronic obstructive pulmonary disease: Secondary | ICD-10-CM

## 2012-09-26 DIAGNOSIS — K219 Gastro-esophageal reflux disease without esophagitis: Secondary | ICD-10-CM

## 2012-09-26 DIAGNOSIS — D353 Benign neoplasm of craniopharyngeal duct: Secondary | ICD-10-CM

## 2012-09-26 DIAGNOSIS — D649 Anemia, unspecified: Secondary | ICD-10-CM

## 2012-09-26 DIAGNOSIS — I7090 Unspecified atherosclerosis: Secondary | ICD-10-CM

## 2012-09-26 DIAGNOSIS — D352 Benign neoplasm of pituitary gland: Secondary | ICD-10-CM

## 2012-09-26 DIAGNOSIS — N400 Enlarged prostate without lower urinary tract symptoms: Secondary | ICD-10-CM

## 2012-09-26 DIAGNOSIS — R5381 Other malaise: Secondary | ICD-10-CM

## 2012-09-26 DIAGNOSIS — Z96649 Presence of unspecified artificial hip joint: Secondary | ICD-10-CM

## 2012-09-26 DIAGNOSIS — J449 Chronic obstructive pulmonary disease, unspecified: Secondary | ICD-10-CM

## 2012-09-26 DIAGNOSIS — Z87898 Personal history of other specified conditions: Secondary | ICD-10-CM

## 2012-09-26 DIAGNOSIS — I2510 Atherosclerotic heart disease of native coronary artery without angina pectoris: Secondary | ICD-10-CM

## 2012-09-26 DIAGNOSIS — E039 Hypothyroidism, unspecified: Secondary | ICD-10-CM

## 2012-09-26 LAB — POCT CBC
Granulocyte percent: 82.9 %G — AB (ref 37–80)
HCT, POC: 31.4 % — AB (ref 43.5–53.7)
Hemoglobin: 10.5 g/dL — AB (ref 14.1–18.1)
Lymph, poc: 1.1 (ref 0.6–3.4)
MCH, POC: 31.4 pg — AB (ref 27–31.2)
MCHC: 33.6 g/dL (ref 31.8–35.4)
MCV: 93.5 fL (ref 80–97)
MPV: 7.2 fL (ref 0–99.8)
POC Granulocyte: 8.9 — AB (ref 2–6.9)
POC LYMPH PERCENT: 10.4 %L (ref 10–50)
Platelet Count, POC: 304 10*3/uL (ref 142–424)
RBC: 3.4 M/uL — AB (ref 4.69–6.13)
RDW, POC: 16.8 %
WBC: 10.7 10*3/uL — AB (ref 4.6–10.2)

## 2012-09-26 NOTE — Progress Notes (Signed)
Subjective:    Patient ID: [name], male    DOB: [birth year], 79 y.o.   MRN: [id number]  HPI Patient returns to clinic today for followup of chronic medical problems. He is status post right hip replacement. He has had a recent visit with his pulmonologist. Please see this note in the record. He has a diagnosis of COPD. He also has a history of reflux esophagitis. His chronic medical conditions include hyperlipidemia hypothyroidism BPH and anemia. Complications from the surgery were urinary retention secondary to BPH, constipation and increased respiratory congestion. He is on Flomax from the urologist. He also has a followup visit scheduled with him.   Review of Systems  Constitutional: Positive for fatigue.  HENT: Positive for congestion and postnasal drip. Negative for ear pain and sore throat.   Eyes: Negative.   Respiratory: Positive for cough (prod), choking, shortness of breath and wheezing.   Cardiovascular: Positive for leg swelling. Negative for chest pain and palpitations.  Gastrointestinal: Positive for nausea and constipation. Negative for abdominal pain and blood in stool.  Endocrine: Negative.   Genitourinary: Positive for frequency. Negative for dysuria.  Musculoskeletal: Negative for myalgias, back pain and arthralgias.  Skin: Positive for wound (R lower leg, seeing Dr Rayburn Ma).  Allergic/Immunologic: Positive for environmental allergies.  Neurological: Positive for dizziness and weakness. Negative for headaches.  Psychiatric/Behavioral: Positive for sleep disturbance. The patient is nervous/anxious (increased due to health issues).        Objective:   Physical Exam BP 135/74  Pulse 89  Temp(Src) 97.2 F (36.2 C)  Ht 5' 6.5" (1.689 m)  Wt 168 lb (76.204 kg)  BMI 26.71 kg/m2  The patient appeared somewhat stressed following his recent surgery and complications but normally developed, alert and oriented to time and place. Speech, behavior and judgement appear  normal. Vital signs as documented.  Head exam is unremarkable. No scleral icterus or pallor noted. Nasal passages were clear with no sign of any bleed. Mouth and throat were normal . Neck is without jugular venous distension, thyromegally, or carotid bruits. Carotid upstrokes are brisk bilaterally. No cervical adenopathy. Lungs are clear anteriorly and posteriorly to auscultation. Normal respiratory effort. With deep breathing there was a dry cough. There were no wheezes today. Cardiac exam reveals regular rate and rhythm at 96 per minute. First and second heart sounds normal.  No murmurs, rubs or gallops.  Abdominal exam reveals normal bowl sounds, no masses, no organomegaly and no aortic enlargement. No inguinal adenopathy. Extremities have edema especially on the right side. This is wrapped because of a blister or ulcer that developed after the surgery. This is being followed by home health. There is some bruising around the right knee. The dressing is in place over the incision scar of the right hip. He has fairly good mobility considering the recent surgery. Skin without pallor or jaundice.  Warm and dry, without rash. Neurologic exam reveals normal deep tendon reflexes and normal sensation.  Labs will be drawn today.        Assessment & Plan:  1. Hyperlipemia - Hepatic function panel; Standing - NMR, lipoprofile; Standing  2. Hypothyroid  3. ASCVD (arteriosclerotic cardiovascular disease)  4. GERD (gastroesophageal reflux disease)  5. COPD (chronic obstructive pulmonary disease)GOLD I  6. BPH (benign prostatic hypertrophy)  7. Vitamin D deficiency - Vitamin D 25 hydroxy; Standing  8. Anemia - POCT CBC; Standing  9. Fatigue - BMP8+EGFR; Standing  10. HYPOTENSION  11. S/P left and right THA, AA  12. PITUITARY ADENOMA  13. BENIGN PROSTATIC HYPERTROPHY, HX OF  Patient Instructions  Fall precautions discussed Continue current meds and therapeutic lifestyle  changes  Be patient with the process of recovery Drink plenty of fluids Use respiratory medicines as directed by the pulmonologist Practice deep breathing  Nyra Capes MD

## 2012-09-26 NOTE — Addendum Note (Signed)
Addended by: Prescott Gum on: 09/26/2012 11:36 AM   Modules accepted: Orders

## 2012-09-26 NOTE — Patient Instructions (Addendum)
Fall precautions discussed Continue current meds and therapeutic lifestyle changes 

## 2012-10-02 ENCOUNTER — Other Ambulatory Visit: Payer: Self-pay | Admitting: Endocrinology

## 2012-10-02 ENCOUNTER — Other Ambulatory Visit: Payer: Self-pay | Admitting: *Deleted

## 2012-10-03 ENCOUNTER — Telehealth: Payer: Self-pay | Admitting: Family Medicine

## 2012-10-03 LAB — BMP8+EGFR
BUN/Creatinine Ratio: 14 (ref 10–22)
BUN: 12 mg/dL (ref 8–27)
CO2: 22 mmol/L (ref 18–29)
Calcium: 8.7 mg/dL (ref 8.6–10.2)
Chloride: 101 mmol/L (ref 97–108)
Creatinine, Ser: 0.87 mg/dL (ref 0.76–1.27)
GFR calc Af Amer: 96 mL/min/{1.73_m2} (ref >59)
GFR calc non Af Amer: 83 mL/min/{1.73_m2} (ref >59)
Glucose: 85 mg/dL (ref 65–99)
Potassium: 4.8 mmol/L (ref 3.5–5.2)
Sodium: 135 mmol/L (ref 134–144)

## 2012-10-03 LAB — NMR, LIPOPROFILE
Cholesterol: 195 mg/dL (ref <200)
HDL Cholesterol by NMR: 57 mg/dL (ref >=40)
HDL Particle Number: 26.5 umol/L — ABNORMAL LOW (ref >=30.5)
LDL Particle Number: 1310 nmol/L — ABNORMAL HIGH (ref <1000)
LDL Size: 21 nm (ref >20.5)
LDLC SERPL CALC-MCNC: 120 mg/dL — ABNORMAL HIGH (ref <100)
LP-IR Score: <25 (ref <=45)
Small LDL Particle Number: 137 nmol/L (ref <=527)
Triglycerides by NMR: 88 mg/dL (ref <150)

## 2012-10-03 LAB — HEPATIC FUNCTION PANEL
ALT: 12 IU/L (ref 0–44)
AST: 17 IU/L (ref 0–40)
Albumin: 3.8 g/dL (ref 3.5–4.8)
Alkaline Phosphatase: 121 IU/L — ABNORMAL HIGH (ref 39–117)
Bilirubin, Direct: 0.23 mg/dL (ref 0.00–0.40)
Total Bilirubin: 0.8 mg/dL (ref 0.0–1.2)
Total Protein: 6.4 g/dL (ref 6.0–8.5)

## 2012-10-03 LAB — VITAMIN D 25 HYDROXY (VIT D DEFICIENCY, FRACTURES): Vit D, 25-Hydroxy: 26.1 ng/mL — ABNORMAL LOW (ref 30.0–100.0)

## 2012-10-04 ENCOUNTER — Other Ambulatory Visit: Payer: Self-pay | Admitting: Endocrinology

## 2012-10-04 ENCOUNTER — Other Ambulatory Visit: Payer: Self-pay | Admitting: *Deleted

## 2012-10-04 MED ORDER — LEVOTHYROXINE SODIUM 50 MCG PO TABS: 50.0000 ug | ORAL_TABLET | Freq: Every morning | ORAL | Status: DC

## 2012-10-09 ENCOUNTER — Ambulatory Visit (INDEPENDENT_AMBULATORY_CARE_PROVIDER_SITE_OTHER): Payer: Medicare Other

## 2012-10-09 ENCOUNTER — Ambulatory Visit (INDEPENDENT_AMBULATORY_CARE_PROVIDER_SITE_OTHER): Payer: Medicare Other | Admitting: Family Medicine

## 2012-10-09 VITALS — BP 135/80 | HR 101 | Temp 97.1°F

## 2012-10-09 DIAGNOSIS — R509 Fever, unspecified: Secondary | ICD-10-CM

## 2012-10-09 DIAGNOSIS — R05 Cough: Secondary | ICD-10-CM

## 2012-10-09 DIAGNOSIS — R0602 Shortness of breath: Secondary | ICD-10-CM

## 2012-10-09 DIAGNOSIS — R059 Cough, unspecified: Secondary | ICD-10-CM

## 2012-10-09 LAB — POCT CBC
Granulocyte percent: 67 %G (ref 37–80)
HCT, POC: 33.7 % — AB (ref 43.5–53.7)
Hemoglobin: 11.3 g/dL — AB (ref 14.1–18.1)
Lymph, poc: 1.3 (ref 0.6–3.4)
MCH, POC: 31.5 pg — AB (ref 27–31.2)
MCHC: 33.4 g/dL (ref 31.8–35.4)
MCV: 94.4 fL (ref 80–97)
MPV: 7 fL (ref 0–99.8)
POC Granulocyte: 4.5 (ref 2–6.9)
POC LYMPH PERCENT: 19.6 %L (ref 10–50)
Platelet Count, POC: 263 10*3/uL (ref 142–424)
RBC: 3.6 M/uL — AB (ref 4.69–6.13)
RDW, POC: 16.2 %
WBC: 6.7 10*3/uL (ref 4.6–10.2)

## 2012-10-09 MED ORDER — LEVOFLOXACIN 500 MG PO TABS: 500.0000 mg | ORAL_TABLET | Freq: Every day | ORAL | Status: DC

## 2012-10-09 MED ORDER — PREDNISONE 50 MG PO TABS: ORAL_TABLET | ORAL | Status: DC

## 2012-10-09 NOTE — Progress Notes (Signed)
  Subjective:    Patient ID: [name], male    DOB: May 21, 1932, 79 y.o.   MRN: [id number]  HPI Presents today with chief complaint recurrent cough. Patient states that he had a right hip replacement about 5 weeks ago. Patient states that since this point he's had persistent cough and shortness of breath. Patient with a baseline history of COPD. Has taken doxycycline for symptoms with minimal improvement. Patient is also been evaluated in the ER for symptoms. Has been ruled out for heart failure as well as PE as a source of symptoms. Has had some subjective fevers at home as well as productive cough and wheezing. Cough does seem to be worse with eating at times. Baseline history of GERD status post Nissen. No CP currently.    Review of Systems  All other systems reviewed and are negative.       Objective:   Physical Exam  Constitutional:  In wheelchair, NAD  HENT:  Head: Normocephalic and atraumatic.  Eyes: Conjunctivae are normal. Pupils are equal, round, and reactive to light.  Neck: Normal range of motion. Neck supple.  Cardiovascular: Normal rate and regular rhythm.   Pulmonary/Chest: Effort normal.  Diffuse faint rales and wheezes  Abdominal: Soft. Bowel sounds are normal.  Musculoskeletal: Normal range of motion.  Neurological: He is alert.  Skin: Skin is warm.    [facility] reading (PRIMARY) by  Dr. Alvester Morin  Preminarily negative for any focal infiltrate, though with mild emphysematous changes.                                          Assessment & Plan:  Cough - Plan: POCT CBC, DG Chest 2 View, predniSONE (DELTASONE) 50 MG tablet, levofloxacin (LEVAQUIN) 500 MG tablet  Fever, unspecified - Plan: POCT CBC, DG Chest 2 View  SOB (shortness of breath) - Plan: POCT CBC, DG Chest 2 View, predniSONE (DELTASONE) 50 MG tablet, levofloxacin (LEVAQUIN) 500 MG tablet   Suspect this is a subacute COPD exacerbation. Will place patient on course of prednisone and  Levaquin. Plan for followup with pulmonology next 1-2 days. Discussed with family that if symptoms worsen to go to the ER for further evaluation. Family would like to avoid  hospital if at all possible. Again discussed respiratory red flags. Followup as needed.

## 2012-10-10 ENCOUNTER — Telehealth: Payer: Self-pay | Admitting: Internal Medicine

## 2012-10-10 NOTE — Telephone Encounter (Signed)
I spoke with [name]. He saw his PCP yesterday for cough w/ yellow phlem, chest congestion. He was started on prednisone 50 mg QD x 5 days and Levaquin 500 mg QD x 7 days. He had a CXR/labs as well (all in [facility]). [name] stated he started this yesterday and could tell he was feeling some better. He stated he feels like he is on the right track right now with treatment. He stated he will call us for appt if he does not get better. This is FYI for MW.

## 2012-10-12 NOTE — Telephone Encounter (Signed)
Please advise if msg can be closed, thank yoU! 

## 2012-10-16 ENCOUNTER — Telehealth: Payer: Self-pay | Admitting: Family Medicine

## 2012-10-16 NOTE — Telephone Encounter (Signed)
Called about labs.

## 2012-10-18 ENCOUNTER — Other Ambulatory Visit: Payer: Self-pay | Admitting: *Deleted

## 2012-10-18 DIAGNOSIS — R2681 Unsteadiness on feet: Secondary | ICD-10-CM

## 2012-10-22 ENCOUNTER — Ambulatory Visit: Payer: Medicare Other | Attending: Orthopaedic Surgery | Admitting: Physical Therapy

## 2012-10-22 DIAGNOSIS — M25559 Pain in unspecified hip: Secondary | ICD-10-CM | POA: Insufficient documentation

## 2012-10-22 DIAGNOSIS — IMO0001 Reserved for inherently not codable concepts without codable children: Secondary | ICD-10-CM | POA: Insufficient documentation

## 2012-10-22 DIAGNOSIS — R269 Unspecified abnormalities of gait and mobility: Secondary | ICD-10-CM | POA: Insufficient documentation

## 2012-10-22 DIAGNOSIS — R5381 Other malaise: Secondary | ICD-10-CM | POA: Insufficient documentation

## 2012-10-23 ENCOUNTER — Telehealth: Payer: Self-pay | Admitting: *Deleted

## 2012-10-23 NOTE — Telephone Encounter (Signed)
Pt still having some congestion Wants refill on antibiotic (Levaquin) and Prednisone pls advise

## 2012-10-23 NOTE — Telephone Encounter (Signed)
Pt started Doxycycline yesterday He is feeling some better today He will call back on Thursday AM if not a lot better

## 2012-10-23 NOTE — Telephone Encounter (Signed)
I need more detail about the congestion. Is it head congestion, chest congestion is he running a fever, is there purulent sputum?? We did the patient finished the Levaquin?

## 2012-10-24 ENCOUNTER — Telehealth: Payer: Self-pay | Admitting: Family Medicine

## 2012-10-24 NOTE — Telephone Encounter (Signed)
Appointment made for tomorrow to follow-up on congestion

## 2012-10-25 ENCOUNTER — Encounter: Payer: Self-pay | Admitting: Family Medicine

## 2012-10-25 ENCOUNTER — Ambulatory Visit (INDEPENDENT_AMBULATORY_CARE_PROVIDER_SITE_OTHER): Payer: Medicare Other | Admitting: Family Medicine

## 2012-10-25 ENCOUNTER — Other Ambulatory Visit: Payer: Self-pay | Admitting: Family Medicine

## 2012-10-25 ENCOUNTER — Ambulatory Visit: Payer: Medicare Other | Admitting: *Deleted

## 2012-10-25 ENCOUNTER — Ambulatory Visit (INDEPENDENT_AMBULATORY_CARE_PROVIDER_SITE_OTHER): Payer: Medicare Other

## 2012-10-25 VITALS — BP 110/62 | HR 105 | Temp 97.3°F | Ht 66.5 in | Wt 159.0 lb

## 2012-10-25 DIAGNOSIS — S7000XA Contusion of unspecified hip, initial encounter: Secondary | ICD-10-CM

## 2012-10-25 DIAGNOSIS — R0989 Other specified symptoms and signs involving the circulatory and respiratory systems: Secondary | ICD-10-CM

## 2012-10-25 DIAGNOSIS — J449 Chronic obstructive pulmonary disease, unspecified: Secondary | ICD-10-CM

## 2012-10-25 DIAGNOSIS — S7001XA Contusion of right hip, initial encounter: Secondary | ICD-10-CM

## 2012-10-25 LAB — POCT CBC
Granulocyte percent: 73.5 %G (ref 37–80)
HCT, POC: 35.7 % — AB (ref 43.5–53.7)
Hemoglobin: 11.7 g/dL — AB (ref 14.1–18.1)
Lymph, poc: 1.4 (ref 0.6–3.4)
MCH, POC: 30.8 pg (ref 27–31.2)
MCHC: 32.9 g/dL (ref 31.8–35.4)
MCV: 93.7 fL (ref 80–97)
MPV: 7.6 fL (ref 0–99.8)
POC Granulocyte: 6.3 (ref 2–6.9)
POC LYMPH PERCENT: 16.4 %L (ref 10–50)
Platelet Count, POC: 212 10*3/uL (ref 142–424)
RBC: 3.8 M/uL — AB (ref 4.69–6.13)
RDW, POC: 16 %
WBC: 8.6 10*3/uL (ref 4.6–10.2)

## 2012-10-25 MED ORDER — METHYLPREDNISOLONE ACETATE 80 MG/ML IJ SUSP
40.0000 mg | Freq: Once | INTRAMUSCULAR | Status: AC
  Administered 2012-10-25: 40 mg via INTRAMUSCULAR

## 2012-10-25 NOTE — Telephone Encounter (Signed)
Called to f/u on patient and he has an appointment for today.

## 2012-10-25 NOTE — Patient Instructions (Addendum)
Continue inhalers of Symbicort and albuterol BE sure and rinse mouth after use of Symbicort Drink plenty of fluids Take Mucinex twice daily Take medication as directed We will arrange an appointment with Dr. Sherene Sires in about 10 days for followup

## 2012-10-25 NOTE — Progress Notes (Signed)
  Subjective:    Patient ID: [name], male    DOB: 09-07-1932, 79 y.o.   MRN: [id number]  HPI Patient continues to have episodes of cough and congestion. He was on Cipro and discharged from the hospital with a round of prednisone. More recently he took a round of Levaquin with prednisone. 3 days ago he started some doxycycline at home. He has had less sputum production over the last 3 days in the color is no longer yellow but clearer.   Review of Systems  Constitutional: Positive for fatigue. Negative for fever, activity change and appetite change.  HENT: Positive for congestion and voice change. Negative for sinus pressure.   Eyes: Negative for pain, redness and itching.  Respiratory: Positive for cough (occasionally, yellow) and shortness of breath.   Cardiovascular: Negative for chest pain, palpitations and leg swelling.  Gastrointestinal: Negative for nausea, vomiting, diarrhea, constipation and blood in stool.  Genitourinary: Negative for dysuria, urgency, frequency and hematuria.  Neurological: Negative for dizziness, tremors, numbness and headaches.  Psychiatric/Behavioral: Positive for decreased concentration ("vaugueness", intermitently). Negative for confusion and sleep disturbance. The patient is not nervous/anxious.        Objective:   Physical Exam BP 110/62  Pulse 105  Temp(Src) 97.3 F (36.3 C) (Oral)  Ht 5' 6.5" (1.689 m)  Wt 159 lb (72.122 kg)  BMI 25.28 kg/m2  The patient appeared well nourished and normally developed considering he is status post right hip replacement, alert and oriented to time and place. Speech, behavior and judgement appear normal. Vital signs as documented.  Head exam is unremarkable. No scleral icterus or pallor noted. Ears nose and throat were  Neck is without jugular venous distension, thyromegally, or carotid bruits. Carotid upstrokes are brisk bilaterally. No cervical adenopathy. Lungs are clear anteriorly and posteriorly to  auscultation. Normal respiratory effort. There were no wheezes or rales. Cardiac exam reveals regular rate and rhythm at 96 per minute. First and second heart sounds normal.  No murmurs, rubs or gallops.  Extremities are nonedematous. The right hip appears to have a hematoma. There 2 areas of redness. Maybe slight warmth over this hematoma . Skin without pallor or jaundice.  Warm and dry, without rash. There is a healing circular area of skin where there was previously a bullous vesical. This is over the right lower leg.        [facility] reading (PRIMARY) by  Dr. Graceanna Theissen::CXR --degenerative changes of L-spine no significant lung findings                                    Assessment & Plan:  1. Chest congestion - POCT CBC - DG Chest 2 View -Take doxycycline twice daily for a total of 10 days -Depo-Medrol 40 mg IM -Prednisone 10 taper #20 8 days  2. COPD mixed type -Continue Symbicort and albuterol as doing  3 Hematoma of right hip, initial encounter  Patient Instructions  Continue inhalers of Symbicort and albuterol BE sure and rinse mouth after use of Symbicort Drink plenty of fluids Take Mucinex twice daily Take medication as directed We will arrange an appointment with Dr. Sherene Sires in about 10 days for followup   Nyra Capes MD

## 2012-10-30 ENCOUNTER — Ambulatory Visit: Payer: Medicare Other | Attending: Orthopaedic Surgery | Admitting: Physical Therapy

## 2012-10-30 DIAGNOSIS — R5381 Other malaise: Secondary | ICD-10-CM | POA: Insufficient documentation

## 2012-10-30 DIAGNOSIS — IMO0001 Reserved for inherently not codable concepts without codable children: Secondary | ICD-10-CM | POA: Insufficient documentation

## 2012-10-30 DIAGNOSIS — M25559 Pain in unspecified hip: Secondary | ICD-10-CM | POA: Insufficient documentation

## 2012-10-30 DIAGNOSIS — R269 Unspecified abnormalities of gait and mobility: Secondary | ICD-10-CM | POA: Insufficient documentation

## 2012-11-01 ENCOUNTER — Ambulatory Visit: Payer: Medicare Other | Admitting: Physical Therapy

## 2012-11-12 ENCOUNTER — Encounter: Payer: Self-pay | Admitting: Endocrinology

## 2012-11-12 ENCOUNTER — Ambulatory Visit (INDEPENDENT_AMBULATORY_CARE_PROVIDER_SITE_OTHER): Payer: Medicare Other | Admitting: Endocrinology

## 2012-11-12 VITALS — BP 128/78 | HR 78 | Ht 67.0 in | Wt 160.0 lb

## 2012-11-12 DIAGNOSIS — E039 Hypothyroidism, unspecified: Secondary | ICD-10-CM

## 2012-11-12 DIAGNOSIS — E229 Hyperfunction of pituitary gland, unspecified: Secondary | ICD-10-CM

## 2012-11-12 LAB — TSH: TSH: 2.33 u[IU]/mL (ref 0.35–5.50)

## 2012-11-12 LAB — T4, FREE: Free T4: 0.84 ng/dL (ref 0.60–1.60)

## 2012-11-12 NOTE — Progress Notes (Signed)
Subjective:    Patient ID: [name], male    DOB: 03-13-1932, 79 y.o.   MRN: [id number]  HPI the status of at least 3 ongoing medical problems is addressed today: pituitary macroadenoma (found incidentally on mri of the c-spine in 2009; he sees Dr Channing Mutters for this--he has f/u appt there in 2 days):  no headache.   hyperprolactinemia:  he still reports intermittent lightheadedness, despite reduction of the parlodel in 2011.  assoc nausea has resolved since then. He says he often takes just 1/2 of a parlodel qhs    hypothyroidism:  denies weight change.   Past Medical History  Diagnosis Date  . Emphysema   . CAD (coronary artery disease)     left main had  a 20% stenosis.  Left anterior descending had 40-50% proximal   stenosis.  Left circumflex had an 80% proximal stenosis.  The  first obtuse marginal artery had a 60% mid stenosis.  The  right  coronary artery  had  a 50-60% proximal stenosis with a 60-70%  mid stenosis  The patient was managed medically.   Marland Kitchen COPD (chronic obstructive pulmonary disease)   . Hypothyroidism   . Anemia   . Blood transfusion june 2011, 5 units given    july 2011 some units given  . Enlarged prostate     elevated psa recently  . GERD (gastroesophageal reflux disease)   . Arthritis   . Complication of anesthesia     likes spinal due to copd  . Dizziness     occasional  . Shortness of breath     with exertion    Past Surgical History  Procedure Laterality Date  . Coronary stent placement  5 yrs ago  . Hiatal hernia repair  01-04-2008  . Foot surgery  1994 left, 2002 right foot    bilateral foot reconstruciton  . Abdominal hernia repair   2008  . Cataract extraction both eyes    . Total hip arthroplasty  07/21/2011    Procedure: TOTAL HIP ARTHROPLASTY ANTERIOR APPROACH;  Surgeon: [name], MD;  Location: [facility];  Service: Orthopedics;  Laterality: Left;  . Coronary angioplasty    . Total hip arthroplasty Right 09/07/2012    Procedure: RIGHT  TOTAL HIP ARTHROPLASTY ANTERIOR APPROACH;  Surgeon: Kathryne Hitch, MD;  Location: [facility];  Service: Orthopedics;  Laterality: Right;    History   Social History  . Marital Status: Married    Spouse Name: N/A    Number of Children: N/A  . Years of Education: N/A   Occupational History  . retired    Social History Main Topics  . Smoking status: Never Smoker   . Smokeless tobacco: Never Used  . Alcohol Use: No  . Drug Use: Yes    Special: Codeine  . Sexual Activity: Not on file   Other Topics Concern  . Not on file   Social History Narrative  . No narrative on file    Current Outpatient Prescriptions on File Prior to Visit  Medication Sig Dispense Refill  . albuterol (PROVENTIL HFA;VENTOLIN HFA) 108 (90 BASE) MCG/ACT inhaler Inhale 2 puffs into the lungs every 6 (six) hours as needed for wheezing or shortness of breath.      Marland Kitchen aspirin EC 81 MG tablet Take 81 mg by mouth daily.      . bromocriptine (PARLODEL) 2.5 MG tablet Take 2.5 mg by mouth at bedtime.      . budesonide-formoterol (SYMBICORT) 160-4.5 MCG/ACT  inhaler Inhale 2 puffs into the lungs 2 (two) times daily.      . cholecalciferol (VITAMIN D) 1000 UNITS tablet Take 1,000 Units by mouth 2 (two) times daily.      Marland Kitchen doxycycline (VIBRA-TABS) 100 MG tablet TAKE  (1)  TABLET TWICE A DAY.  20 tablet  0  . ferrous sulfate 325 (65 FE) MG tablet Take 325 mg by mouth daily.       Marland Kitchen guaiFENesin (MUCINEX) 600 MG 12 hr tablet Take 600 mg by mouth 2 (two) times daily.       Marland Kitchen levothyroxine (SYNTHROID, LEVOTHROID) 50 MCG tablet TAKE 1 TABLET DAILY  30 tablet  1  . levothyroxine (SYNTHROID, LEVOTHROID) 50 MCG tablet Take 1 tablet (50 mcg total) by mouth every morning.  30 tablet  1  . methocarbamol (ROBAXIN) 500 MG tablet Take 500 mg by mouth 4 (four) times daily as needed (pain).      . montelukast (SINGULAIR) 10 MG tablet Take 10 mg by mouth daily as needed (for allergies).       . nitroGLYCERIN (NITROSTAT) 0.4 MG SL  tablet Place 0.4 mg under the tongue every 5 (five) minutes as needed for chest pain.      . pantoprazole (PROTONIX) 40 MG tablet Take 40 mg by mouth every morning.      . tamsulosin (FLOMAX) 0.4 MG CAPS Take 1 capsule (0.4 mg total) by mouth daily.  10 capsule  0  . traMADol (ULTRAM) 50 MG tablet Take 50 mg by mouth every 6 (six) hours as needed for pain.       No current facility-administered medications on file prior to visit.   Allergies  Allergen Reactions  . Lortab [Hydrocodone-Acetaminophen] Nausea And Vomiting  . Penicillins Other (See Comments)    Light headed  . Zetia [Ezetimibe]     Muscle weakness  . Zocor [Simvastatin] Other (See Comments)    Nausea, muscle weakness   Family History  Problem Relation Age of Onset  . Heart disease Sister   . Heart failure Father   . Colon cancer Neg Hx    BP 128/78  Pulse 78  Ht 5\' 7"  (1.702 m)  Wt 160 lb (72.576 kg)  BMI 25.05 kg/m2  SpO2 97%  Review of Systems Denies LOC and visual loss.    Objective:   Physical Exam VITAL SIGNS:  See vs page GENERAL: no distress Gait: slow but steady, with a walker.  NECK: There is no palpable thyroid enlargement.  No thyroid nodule is palpable.  No palpable lymphadenopathy at the anterior neck.   outside test results are reviewed (MRI, 2013) 12x14x9 mm left sided pituitary adenoma Lab Results  Component Value Date   TSH 2.33 11/12/2012      Assessment & Plan:  Hypothyroidism, well-replaced. Hyperprolactinemia, on rx.   Pituitary adenoma, followed by neurosurg Dizziness.  There are several possible causes, including parlodel.  The risks and benefits of this med have to be weighed.

## 2012-11-12 NOTE — Patient Instructions (Addendum)
blood tests are being requested for you today.  We'll contact you with results. Please return in 1 year. Please sign a release of information, for your last MRI report.

## 2012-11-13 LAB — PROLACTIN: Prolactin: 59.4 ng/mL — ABNORMAL HIGH (ref 2.1–17.1)

## 2012-11-13 NOTE — Addendum Note (Signed)
Addended by: Gwenith Daily on: 11/13/2012 04:54 PM   Modules accepted: Orders

## 2012-11-29 ENCOUNTER — Telehealth: Payer: Self-pay

## 2012-11-29 NOTE — Telephone Encounter (Signed)
FYI pt left voicemail to let you know he stopped taking Bromocriptine and he feels better

## 2012-11-29 NOTE — Telephone Encounter (Signed)
Noted, thank you

## 2012-12-03 ENCOUNTER — Ambulatory Visit (INDEPENDENT_AMBULATORY_CARE_PROVIDER_SITE_OTHER): Payer: Medicare Other

## 2012-12-03 DIAGNOSIS — Z23 Encounter for immunization: Secondary | ICD-10-CM

## 2012-12-27 ENCOUNTER — Ambulatory Visit: Payer: Medicare Other | Admitting: Family Medicine

## 2012-12-28 ENCOUNTER — Ambulatory Visit (INDEPENDENT_AMBULATORY_CARE_PROVIDER_SITE_OTHER): Payer: Medicare Other | Admitting: Urology

## 2012-12-28 DIAGNOSIS — N401 Enlarged prostate with lower urinary tract symptoms: Secondary | ICD-10-CM

## 2012-12-28 DIAGNOSIS — N138 Other obstructive and reflux uropathy: Secondary | ICD-10-CM

## 2012-12-31 ENCOUNTER — Encounter: Payer: Self-pay | Admitting: Family Medicine

## 2012-12-31 ENCOUNTER — Ambulatory Visit (INDEPENDENT_AMBULATORY_CARE_PROVIDER_SITE_OTHER): Payer: Medicare Other | Admitting: Family Medicine

## 2012-12-31 VITALS — BP 132/81 | HR 75 | Temp 97.0°F | Ht 67.0 in | Wt 167.0 lb

## 2012-12-31 DIAGNOSIS — J209 Acute bronchitis, unspecified: Secondary | ICD-10-CM

## 2012-12-31 DIAGNOSIS — J449 Chronic obstructive pulmonary disease, unspecified: Secondary | ICD-10-CM

## 2012-12-31 DIAGNOSIS — R0989 Other specified symptoms and signs involving the circulatory and respiratory systems: Secondary | ICD-10-CM

## 2012-12-31 DIAGNOSIS — I959 Hypotension, unspecified: Secondary | ICD-10-CM

## 2012-12-31 DIAGNOSIS — I2510 Atherosclerotic heart disease of native coronary artery without angina pectoris: Secondary | ICD-10-CM

## 2012-12-31 DIAGNOSIS — Z87898 Personal history of other specified conditions: Secondary | ICD-10-CM

## 2012-12-31 DIAGNOSIS — R05 Cough: Secondary | ICD-10-CM

## 2012-12-31 DIAGNOSIS — E559 Vitamin D deficiency, unspecified: Secondary | ICD-10-CM

## 2012-12-31 DIAGNOSIS — E785 Hyperlipidemia, unspecified: Secondary | ICD-10-CM

## 2012-12-31 DIAGNOSIS — R059 Cough, unspecified: Secondary | ICD-10-CM

## 2012-12-31 DIAGNOSIS — N400 Enlarged prostate without lower urinary tract symptoms: Secondary | ICD-10-CM

## 2012-12-31 DIAGNOSIS — E039 Hypothyroidism, unspecified: Secondary | ICD-10-CM

## 2012-12-31 LAB — POCT CBC
Granulocyte percent: 78 %G (ref 37–80)
HCT, POC: 42.3 % — AB (ref 43.5–53.7)
Hemoglobin: 13.6 g/dL — AB (ref 14.1–18.1)
Lymph, poc: 1.5 (ref 0.6–3.4)
MCH, POC: 29.8 pg (ref 27–31.2)
MCHC: 32.2 g/dL (ref 31.8–35.4)
MCV: 92.6 fL (ref 80–97)
MPV: 7.7 fL (ref 0–99.8)
POC Granulocyte: 7.6 — AB (ref 2–6.9)
POC LYMPH PERCENT: 15.7 %L (ref 10–50)
Platelet Count, POC: 215 10*3/uL (ref 142–424)
RBC: 4.6 M/uL — AB (ref 4.69–6.13)
RDW, POC: 15.5 %
WBC: 9.8 10*3/uL (ref 4.6–10.2)

## 2012-12-31 LAB — POCT URINALYSIS DIPSTICK
Bilirubin, UA: NEGATIVE
Glucose, UA: NEGATIVE
Ketones, UA: NEGATIVE
Leukocytes, UA: NEGATIVE
Nitrite, UA: NEGATIVE
Protein, UA: 100
Spec Grav, UA: 1.01
Urobilinogen, UA: NEGATIVE
pH, UA: 7

## 2012-12-31 LAB — POCT UA - MICROSCOPIC ONLY
Casts, Ur, LPF, POC: NEGATIVE
Crystals, Ur, HPF, POC: NEGATIVE
Mucus, UA: NEGATIVE
Yeast, UA: NEGATIVE

## 2012-12-31 NOTE — Patient Instructions (Addendum)
Continue current medications. Continue good therapeutic lifestyle changes.  Fall precautions discussed with patient. Follow up as planned and earlier as needed. Return to clinic in a couple weeks and get Prevnar Return to clinic in 2 weeks for followup Increased Mucinex 2 twice daily with a large glass of water

## 2012-12-31 NOTE — Addendum Note (Signed)
Addended by: Prescott Gum on: 12/31/2012 09:05 AM   Modules accepted: Orders

## 2012-12-31 NOTE — Progress Notes (Signed)
Subjective:    Patient ID: [name], male    DOB: [birth year], 79 y.o.   MRN: [id number]  HPI Pt here for follow up and management of chronic medical problems. Patient comes in today for routine followup. As of note he had a right hip replacement this summer. Today he complains of increased chest congestion and yellow sputum. He is followed regularly by the endocrinologist and the pulmonologist. Patient started a prescription for doxycycline 100 twice daily 4 days ago. He is only taking one Mucinex daily.     Patient Active Problem List   Diagnosis Date Noted  . S/P left and ritght THA, AA 07/21/2011  . Special screening for malignant neoplasms, colon 01/03/2011  . Personal history of colonic polyps 01/03/2011  . COPD GOLD I 08/24/2010  . GI BLEED 11/17/2009  . ESOPHAGEAL REFLUX 08/25/2009  . BLOOD IN STOOL-MELENA 08/25/2009  . COLONIC POLYPS 07/17/2008  . ANEMIA 07/17/2008  . HYPOTENSION 07/17/2008  . ESOPHAGITIS 07/17/2008  . DIVERTICULOSIS, MILD 07/17/2008  . DEGENERATIVE JOINT DISEASE 07/17/2008  . OSTEOPOROSIS 07/17/2008  . BENIGN PROSTATIC HYPERTROPHY, HX OF 07/17/2008  . HYPERPROLACTINEMIA 10/24/2007  . PITUITARY ADENOMA 06/25/2007  . HYPOTHYROIDISM 06/25/2007  . HYPERLIPIDEMIA 06/22/2007  . CORONARY ARTERY DISEASE 06/22/2007   Outpatient Encounter Prescriptions as of 12/31/2012  Medication Sig  . albuterol (PROVENTIL HFA;VENTOLIN HFA) 108 (90 BASE) MCG/ACT inhaler Inhale 2 puffs into the lungs every 6 (six) hours as needed for wheezing or shortness of breath.  Marland Kitchen aspirin EC 81 MG tablet Take 81 mg by mouth daily.  . budesonide-formoterol (SYMBICORT) 160-4.5 MCG/ACT inhaler Inhale 2 puffs into the lungs 2 (two) times daily.  . cholecalciferol (VITAMIN D) 1000 UNITS tablet Take 1,000 Units by mouth 2 (two) times daily.  Marland Kitchen doxycycline (VIBRA-TABS) 100 MG tablet TAKE  (1)  TABLET TWICE A DAY.  . ferrous sulfate 325 (65 FE) MG tablet Take 325 mg by mouth daily.   Marland Kitchen  guaiFENesin (MUCINEX) 600 MG 12 hr tablet Take 600 mg by mouth 2 (two) times daily.   Marland Kitchen levothyroxine (SYNTHROID, LEVOTHROID) 50 MCG tablet TAKE 1 TABLET DAILY  . montelukast (SINGULAIR) 10 MG tablet Take 10 mg by mouth daily as needed (for allergies).   . nitroGLYCERIN (NITROSTAT) 0.4 MG SL tablet Place 0.4 mg under the tongue every 5 (five) minutes as needed for chest pain.  . pantoprazole (PROTONIX) 40 MG tablet Take 40 mg by mouth every morning.  . tamsulosin (FLOMAX) 0.4 MG CAPS Take 1 capsule (0.4 mg total) by mouth daily.  . traMADol (ULTRAM) 50 MG tablet Take 50 mg by mouth every 6 (six) hours as needed for pain.  . [DISCONTINUED] levothyroxine (SYNTHROID, LEVOTHROID) 50 MCG tablet Take 1 tablet (50 mcg total) by mouth every morning.  . [DISCONTINUED] methocarbamol (ROBAXIN) 500 MG tablet Take 500 mg by mouth 4 (four) times daily as needed (pain).    Review of Systems  Constitutional: Negative.  Negative for fever.  HENT: Positive for congestion and postnasal drip.   Eyes: Negative.   Respiratory: Positive for cough (yellow). Negative for shortness of breath and wheezing.   Cardiovascular: Negative.   Gastrointestinal: Negative.   Endocrine: Negative.   Genitourinary: Negative.   Musculoskeletal: Negative.   Skin: Negative.   Allergic/Immunologic: Negative.   Neurological: Positive for light-headedness (with quick movements).  Hematological: Negative.   Psychiatric/Behavioral: Negative.        Objective:   Physical Exam  Nursing note and vitals reviewed. Constitutional: He is oriented  to person, place, and time. He appears well-developed and well-nourished.  For his age and his color is good  HENT:  Head: Normocephalic and atraumatic.  Right Ear: External ear normal.  Left Ear: External ear normal.  Mouth/Throat: Oropharynx is clear and moist. No oropharyngeal exudate.  Slight nasal congestion bilateral  Eyes: Conjunctivae and EOM are normal. Pupils are equal, round,  and reactive to light. Right eye exhibits no discharge. Left eye exhibits no discharge. No scleral icterus.  Neck: Normal range of motion. Neck supple. No tracheal deviation present. No thyromegaly present.  The bruits in the neck  Cardiovascular: Normal rate, regular rhythm, normal heart sounds and intact distal pulses.  Exam reveals no gallop and no friction rub.   No murmur heard. At 72 per minute  Pulmonary/Chest: Effort normal and breath sounds normal. No respiratory distress. He has no wheezes. He has no rales. He exhibits no tenderness.  Slight congestion with coughing  Abdominal: Soft. Bowel sounds are normal. He exhibits no mass. There is no tenderness. There is no rebound and no guarding.  Genitourinary: Rectum normal and penis normal.  Prostate is slightly enlarged left greater than right. There were no rectal masses. Testicles were normal and there is no inguinal hernia.  Musculoskeletal: Normal range of motion. He exhibits no edema and no tenderness.  Lymphadenopathy:    He has no cervical adenopathy.  Neurological: He is alert and oriented to person, place, and time. He has normal reflexes. No cranial nerve deficit.  Skin: Skin is warm and dry. No rash noted. No erythema. No pallor.  Skin color is good.  Psychiatric: He has a normal mood and affect. His behavior is normal. Judgment and thought content normal.   BP 132/81  Pulse 75  Temp(Src) 97 F (36.1 C) (Oral)  Ht 5\' 7"  (1.702 m)  Wt 167 lb (75.751 kg)  BMI 26.15 kg/m2        Assessment & Plan:   1. Acute bronchitis   2. COPD GOLD I   3. CORONARY ARTERY DISEASE   4. BENIGN PROSTATIC HYPERTROPHY, HX OF   5. HYPERLIPIDEMIA   6. HYPOTHYROIDISM   7. HYPOTENSION   8. Vitamin D deficiency   9. BPH (benign prostatic hypertrophy)   10. Cough   11. Chest congestion    Orders Placed This Encounter  Procedures  . NMR, lipoprofile  . BMP8+EGFR  . Hepatic function panel  . Vit D  25 hydroxy (rtn osteoporosis  monitoring)  . PSA, total and free  . POCT CBC  . POCT UA - Microscopic Only  . POCT urinalysis dipstick   No orders of the defined types were placed in this encounter.   Patient Instructions  Continue current medications. Continue good therapeutic lifestyle changes.  Fall precautions discussed with patient. Follow up as planned and earlier as needed. Return to clinic in a couple weeks and get Prevnar Return to clinic in 2 weeks for followup Increased Mucinex 2 twice daily with a large glass of water     Nyra Capes MD

## 2013-01-02 LAB — HEPATIC FUNCTION PANEL
ALT: 15 IU/L (ref 0–44)
AST: 24 IU/L (ref 0–40)
Albumin: 3.8 g/dL (ref 3.5–4.8)
Alkaline Phosphatase: 98 IU/L (ref 39–117)
Bilirubin, Direct: 0.09 mg/dL (ref 0.00–0.40)
Total Bilirubin: 0.4 mg/dL (ref 0.0–1.2)
Total Protein: 6.6 g/dL (ref 6.0–8.5)

## 2013-01-02 LAB — BMP8+EGFR
BUN/Creatinine Ratio: 20 (ref 10–22)
BUN: 17 mg/dL (ref 8–27)
CO2: 21 mmol/L (ref 18–29)
Calcium: 9 mg/dL (ref 8.6–10.2)
Chloride: 102 mmol/L (ref 97–108)
Creatinine, Ser: 0.86 mg/dL (ref 0.76–1.27)
GFR calc Af Amer: 96 mL/min/{1.73_m2} (ref >59)
GFR calc non Af Amer: 83 mL/min/{1.73_m2} (ref >59)
Glucose: 86 mg/dL (ref 65–99)
Potassium: 4.4 mmol/L (ref 3.5–5.2)
Sodium: 138 mmol/L (ref 134–144)

## 2013-01-02 LAB — NMR, LIPOPROFILE
Cholesterol: 195 mg/dL (ref <200)
HDL Cholesterol by NMR: 55 mg/dL (ref >=40)
HDL Particle Number: 29.3 umol/L — ABNORMAL LOW (ref >=30.5)
LDL Particle Number: 1683 nmol/L — ABNORMAL HIGH (ref <1000)
LDL Size: 21.4 nm (ref >20.5)
LDLC SERPL CALC-MCNC: 123 mg/dL — ABNORMAL HIGH (ref <100)
LP-IR Score: <25 (ref <=45)
Small LDL Particle Number: 141 nmol/L (ref <=527)
Triglycerides by NMR: 85 mg/dL (ref <150)

## 2013-01-02 LAB — URINE CULTURE: Organism ID, Bacteria: NO GROWTH

## 2013-01-02 LAB — PSA, TOTAL AND FREE
PSA, Free Pct: 12.3 %
PSA, Free: 0.64 ng/mL
PSA: 5.2 ng/mL — ABNORMAL HIGH (ref 0.0–4.0)

## 2013-01-02 LAB — VITAMIN D 25 HYDROXY (VIT D DEFICIENCY, FRACTURES): Vit D, 25-Hydroxy: 32.8 ng/mL (ref 30.0–100.0)

## 2013-01-03 ENCOUNTER — Other Ambulatory Visit: Payer: Self-pay | Admitting: Family Medicine

## 2013-01-04 NOTE — Telephone Encounter (Signed)
Patient last seen 12/31/12  DWM

## 2013-01-09 ENCOUNTER — Ambulatory Visit (INDEPENDENT_AMBULATORY_CARE_PROVIDER_SITE_OTHER): Payer: Medicare Other | Admitting: Family Medicine

## 2013-01-09 ENCOUNTER — Encounter: Payer: Self-pay | Admitting: Family Medicine

## 2013-01-09 VITALS — BP 137/85 | HR 60 | Temp 97.8°F | Ht 67.0 in | Wt 168.0 lb

## 2013-01-09 DIAGNOSIS — R0989 Other specified symptoms and signs involving the circulatory and respiratory systems: Secondary | ICD-10-CM

## 2013-01-09 DIAGNOSIS — R972 Elevated prostate specific antigen [PSA]: Secondary | ICD-10-CM

## 2013-01-09 DIAGNOSIS — N400 Enlarged prostate without lower urinary tract symptoms: Secondary | ICD-10-CM

## 2013-01-09 DIAGNOSIS — Z23 Encounter for immunization: Secondary | ICD-10-CM

## 2013-01-09 DIAGNOSIS — J209 Acute bronchitis, unspecified: Secondary | ICD-10-CM

## 2013-01-09 NOTE — Progress Notes (Signed)
Subjective:    Patient ID: [name], male    DOB: [birth year], 79 y.o.   MRN: [id number]  HPI Patient here today for 10 day recheck of recent cough. Patient comes in today for recheck of cough and congestion. He is feeling better though still having some sputum production. As of note the patient also had an elevated PSA and a recent visit with Dr. Annabell Howells, and it may be unaware of his elevated PSA. It will be discussed with the patient today to come back in 2 -3 weeks and repeat the PSA.     Patient Active Problem List   Diagnosis Date Noted  . S/P left and ritght THA, AA 07/21/2011  . Special screening for malignant neoplasms, colon 01/03/2011  . Personal history of colonic polyps 01/03/2011  . COPD GOLD I 08/24/2010  . GI BLEED 11/17/2009  . ESOPHAGEAL REFLUX 08/25/2009  . BLOOD IN STOOL-MELENA 08/25/2009  . COLONIC POLYPS 07/17/2008  . ANEMIA 07/17/2008  . HYPOTENSION 07/17/2008  . ESOPHAGITIS 07/17/2008  . DIVERTICULOSIS, MILD 07/17/2008  . DEGENERATIVE JOINT DISEASE 07/17/2008  . OSTEOPOROSIS 07/17/2008  . BENIGN PROSTATIC HYPERTROPHY, HX OF 07/17/2008  . HYPERPROLACTINEMIA 10/24/2007  . PITUITARY ADENOMA 06/25/2007  . HYPOTHYROIDISM 06/25/2007  . HYPERLIPIDEMIA 06/22/2007  . CORONARY ARTERY DISEASE 06/22/2007   Outpatient Encounter Prescriptions as of 01/09/2013  Medication Sig  . albuterol (PROVENTIL HFA;VENTOLIN HFA) 108 (90 BASE) MCG/ACT inhaler Inhale 2 puffs into the lungs every 6 (six) hours as needed for wheezing or shortness of breath.  Marland Kitchen aspirin EC 81 MG tablet Take 81 mg by mouth daily.  . budesonide-formoterol (SYMBICORT) 160-4.5 MCG/ACT inhaler Inhale 2 puffs into the lungs 2 (two) times daily.  . cholecalciferol (VITAMIN D) 1000 UNITS tablet Take 1,000 Units by mouth 2 (two) times daily.  . ferrous sulfate 325 (65 FE) MG tablet Take 325 mg by mouth daily.   Marland Kitchen guaiFENesin (MUCINEX) 600 MG 12 hr tablet Take 600 mg by mouth 2 (two) times daily.   Marland Kitchen  levothyroxine (SYNTHROID, LEVOTHROID) 50 MCG tablet TAKE 1 TABLET DAILY  . montelukast (SINGULAIR) 10 MG tablet Take 10 mg by mouth daily as needed (for allergies).   . nitroGLYCERIN (NITROSTAT) 0.4 MG SL tablet Place 0.4 mg under the tongue every 5 (five) minutes as needed for chest pain.  . pantoprazole (PROTONIX) 40 MG tablet Take 40 mg by mouth every morning.  . tamsulosin (FLOMAX) 0.4 MG CAPS Take 1 capsule (0.4 mg total) by mouth daily.  . traMADol (ULTRAM) 50 MG tablet Take 50 mg by mouth every 6 (six) hours as needed for pain.  . [DISCONTINUED] doxycycline (VIBRA-TABS) 100 MG tablet TAKE  (1)  TABLET TWICE A DAY.    Review of Systems  Constitutional: Negative.   HENT: Positive for congestion (yellow-tint) and postnasal drip.   Eyes: Negative.   Respiratory: Positive for cough (much better/ still some yellow-tint congesetion).   Cardiovascular: Negative.   Gastrointestinal: Negative.   Endocrine: Negative.   Genitourinary: Negative.   Musculoskeletal: Negative.   Skin: Negative.   Allergic/Immunologic: Negative.   Neurological: Negative.   Hematological: Negative.   Psychiatric/Behavioral: Negative.        Objective:   Physical Exam  Nursing note and vitals reviewed. Constitutional: He is oriented to person, place, and time. He appears well-developed and well-nourished. No distress.  HENT:  Head: Normocephalic.  Right Ear: External ear normal.  Left Ear: External ear normal.  Nose: Nose normal.  Mouth/Throat: Oropharynx is clear and  moist. No oropharyngeal exudate.  Slight nasal congestion  Eyes: Conjunctivae are normal. Right eye exhibits no discharge. Left eye exhibits no discharge. No scleral icterus.  Neck: Normal range of motion. Neck supple. No thyromegaly present.  No carotid bruits  Cardiovascular: Normal rate, regular rhythm and normal heart sounds.   No murmur heard. At 72 per minute  Pulmonary/Chest: Effort normal. No respiratory distress. He has no  wheezes. He has no rales.  Minimal congestion bilaterally with deep breathing  Abdominal: Soft. Bowel sounds are normal. He exhibits no mass. There is no tenderness. There is no guarding.  Musculoskeletal: Normal range of motion. He exhibits no edema.  Neurological: He is alert and oriented to person, place, and time. He has normal reflexes.  Skin: Skin is warm and dry. No rash noted.  Psychiatric: He has a normal mood and affect. His behavior is normal. Judgment and thought content normal.   BP 137/85  Pulse 60  Temp(Src) 97.8 F (36.6 C) (Oral)  Ht 5\' 7"  (1.702 m)  Wt 168 lb (76.204 kg)  BMI 26.31 kg/m2        Assessment & Plan:   1. Chest congestion   2. Need for prophylactic vaccination against Streptococcus pneumoniae (pneumococcus)   3. Acute bronchitis   4. Elevated PSA   5. BPH (benign prostatic hypertrophy)    Orders Placed This Encounter  Procedures  . Pneumococcal conjugate vaccine 13-valent  . PSA, total and free    Standing Status: Future     Number of Occurrences:      Standing Expiration Date: 01/09/2014   Patient Instructions  Continue current medications. Continue good therapeutic lifestyle changes which include good diet and exercise. Fall precautions discussed with patient.  Pneumococcal Vaccine, Polyvalent suspension for injection What is this medicine? PNEUMOCOCCAL VACCINE, POLYVALENT (NEU mo KOK al vak SEEN, pol ee VEY luhnt) is a vaccine to prevent pneumococcus bacteria infection. These bacteria are a major cause of ear infections, 'Strep throat' infections, and serious pneumonia, meningitis, or blood infections worldwide. These vaccines help the body to produce antibodies (protective substances) that help your body defend against these bacteria. This vaccine is recommended for infants and young children. This vaccine will not treat an infection. This medicine may be used for other purposes; ask your health care provider or pharmacist if you have  questions. COMMON BRAND NAME(S): Prevnar 13 , Prevnar What should I tell my health care provider before I take this medicine? They need to know if you have any of these conditions: -bleeding problems -fever -immune system problems -low platelet count in the blood -seizures -an unusual or allergic reaction to pneumococcal vaccine, diphtheria toxoid, other vaccines, latex, other medicines, foods, dyes, or preservatives -pregnant or trying to get pregnant -breast-feeding How should I use this medicine? This vaccine is for injection into a muscle. It is given by a health care professional. A copy of Vaccine Information Statements will be given before each vaccination. Read this sheet carefully each time. The sheet may change frequently. Talk to your pediatrician regarding the use of this medicine in children. While this drug may be prescribed for children as young as 67 weeks old for selected conditions, precautions do apply. Overdosage: If you think you have taken too much of this medicine contact a poison control center or emergency room at once. NOTE: This medicine is only for you. Do not share this medicine with others. What if I miss a dose? It is important not to miss your dose. Call  your doctor or health care professional if you are unable to keep an appointment. What may interact with this medicine? -medicines for cancer chemotherapy -medicines that suppress your immune function -medicines that treat or prevent blood clots like warfarin, enoxaparin, and dalteparin -steroid medicines like prednisone or cortisone This list may not describe all possible interactions. Give your health care provider a list of all the medicines, herbs, non-prescription drugs, or dietary supplements you use. Also tell them if you smoke, drink alcohol, or use illegal drugs. Some items may interact with your medicine. What should I watch for while using this medicine? Mild fever and pain should go away in 3 days  or less. Report any unusual symptoms to your doctor or health care professional. What side effects may I notice from receiving this medicine? Side effects that you should report to your doctor or health care professional as soon as possible: -allergic reactions like skin rash, itching or hives, swelling of the face, lips, or tongue -breathing problems -confused -fever over 102 degrees F -pain, tingling, numbness in the hands or feet -seizures -unusual bleeding or bruising -unusual muscle weakness Side effects that usually do not require medical attention (report to your doctor or health care professional if they continue or are bothersome): -aches and pains -diarrhea -fever of 102 degrees F or less -headache -irritable -loss of appetite -pain, tender at site where injected -trouble sleeping This list may not describe all possible side effects. Call your doctor for medical advice about side effects. You may report side effects to FDA at 1-800-FDA-1088. Where should I keep my medicine? This does not apply. This vaccine is given in a clinic, pharmacy, doctor's office, or other health care setting and will not be stored at home. NOTE: This sheet is a summary. It may not cover all possible information. If you have questions about this medicine, talk to your doctor, pharmacist, or health care provider.  2014, Elsevier/Gold Standard. (2008-04-29 10:17:22)  Continue current medications Continue inhalers Continue to drink plenty of fluids and avoid environmental irritations Ghazi't put yourself at risk for falling Continue to use the cool mist humidifier Drink plenty of fluids Use Mucinex maximum strength, blue and white in color, over-the-counter one twice daily for cough and congestion Practice taking good deep breaths Return to clinic in 2-3 weeks and get PSA repeated Use saline nose spray or consider using the Nettie pot for irrigation of the sinuses    Nyra Capes MD

## 2013-01-09 NOTE — Patient Instructions (Addendum)
Continue current medications. Continue good therapeutic lifestyle changes which include good diet and exercise. Fall precautions discussed with patient.  Pneumococcal Vaccine, Polyvalent suspension for injection What is this medicine? PNEUMOCOCCAL VACCINE, POLYVALENT (NEU mo KOK al vak SEEN, pol ee VEY luhnt) is a vaccine to prevent pneumococcus bacteria infection. These bacteria are a major cause of ear infections, 'Strep throat' infections, and serious pneumonia, meningitis, or blood infections worldwide. These vaccines help the body to produce antibodies (protective substances) that help your body defend against these bacteria. This vaccine is recommended for infants and young children. This vaccine will not treat an infection. This medicine may be used for other purposes; ask your health care provider or pharmacist if you have questions. COMMON BRAND NAME(S): Prevnar 13 , Prevnar What should I tell my health care provider before I take this medicine? They need to know if you have any of these conditions: -bleeding problems -fever -immune system problems -low platelet count in the blood -seizures -an unusual or allergic reaction to pneumococcal vaccine, diphtheria toxoid, other vaccines, latex, other medicines, foods, dyes, or preservatives -pregnant or trying to get pregnant -breast-feeding How should I use this medicine? This vaccine is for injection into a muscle. It is given by a health care professional. A copy of Vaccine Information Statements will be given before each vaccination. Read this sheet carefully each time. The sheet may change frequently. Talk to your pediatrician regarding the use of this medicine in children. While this drug may be prescribed for children as young as 37 weeks old for selected conditions, precautions do apply. Overdosage: If you think you have taken too much of this medicine contact a poison control center or emergency room at once. NOTE: This medicine is  only for you. Do not share this medicine with others. What if I miss a dose? It is important not to miss your dose. Call your doctor or health care professional if you are unable to keep an appointment. What may interact with this medicine? -medicines for cancer chemotherapy -medicines that suppress your immune function -medicines that treat or prevent blood clots like warfarin, enoxaparin, and dalteparin -steroid medicines like prednisone or cortisone This list may not describe all possible interactions. Give your health care provider a list of all the medicines, herbs, non-prescription drugs, or dietary supplements you use. Also tell them if you smoke, drink alcohol, or use illegal drugs. Some items may interact with your medicine. What should I watch for while using this medicine? Mild fever and pain should go away in 3 days or less. Report any unusual symptoms to your doctor or health care professional. What side effects may I notice from receiving this medicine? Side effects that you should report to your doctor or health care professional as soon as possible: -allergic reactions like skin rash, itching or hives, swelling of the face, lips, or tongue -breathing problems -confused -fever over 102 degrees F -pain, tingling, numbness in the hands or feet -seizures -unusual bleeding or bruising -unusual muscle weakness Side effects that usually do not require medical attention (report to your doctor or health care professional if they continue or are bothersome): -aches and pains -diarrhea -fever of 102 degrees F or less -headache -irritable -loss of appetite -pain, tender at site where injected -trouble sleeping This list may not describe all possible side effects. Call your doctor for medical advice about side effects. You may report side effects to FDA at 1-800-FDA-1088. Where should I keep my medicine? This does not apply. This  vaccine is given in a clinic, pharmacy, doctor's  office, or other health care setting and will not be stored at home. NOTE: This sheet is a summary. It may not cover all possible information. If you have questions about this medicine, talk to your doctor, pharmacist, or health care provider.  2014, [employer]vier/Gold Standard. (2008-04-29 10:17:22)  Continue current medications Continue inhalers Continue to drink plenty of fluids and avoid environmental irritations Zahki't put yourself at risk for falling Continue to use the cool mist humidifier Drink plenty of fluids Use Mucinex maximum strength, blue and white in color, over-the-counter one twice daily for cough and congestion Practice taking good deep breaths Return to clinic in 2-3 weeks and get PSA repeated Use saline nose spray or consider using the Nettie pot for irrigation of the sinuses

## 2013-01-10 ENCOUNTER — Other Ambulatory Visit: Payer: Self-pay | Admitting: Endocrinology

## 2013-02-04 ENCOUNTER — Encounter: Payer: Self-pay | Admitting: Endocrinology

## 2013-02-05 ENCOUNTER — Encounter: Payer: Self-pay | Admitting: Endocrinology

## 2013-02-07 ENCOUNTER — Encounter: Payer: Self-pay | Admitting: Endocrinology

## 2013-02-07 ENCOUNTER — Other Ambulatory Visit (INDEPENDENT_AMBULATORY_CARE_PROVIDER_SITE_OTHER): Payer: Medicare Other

## 2013-02-07 DIAGNOSIS — R972 Elevated prostate specific antigen [PSA]: Secondary | ICD-10-CM

## 2013-02-07 NOTE — Progress Notes (Signed)
Pt came in for labs only 

## 2013-02-08 LAB — PSA, TOTAL AND FREE
PSA, Free Pct: 12.6 %
PSA, Free: 0.67 ng/mL
PSA: 5.3 ng/mL — ABNORMAL HIGH (ref 0.0–4.0)

## 2013-02-19 ENCOUNTER — Other Ambulatory Visit: Payer: Self-pay | Admitting: Internal Medicine

## 2013-02-25 ENCOUNTER — Encounter: Payer: Self-pay | Admitting: Internal Medicine

## 2013-02-25 ENCOUNTER — Ambulatory Visit (INDEPENDENT_AMBULATORY_CARE_PROVIDER_SITE_OTHER): Payer: Medicare Other | Admitting: Internal Medicine

## 2013-02-25 VITALS — BP 134/68 | HR 72 | Temp 97.4°F | Ht 67.0 in | Wt 175.0 lb

## 2013-02-25 DIAGNOSIS — J449 Chronic obstructive pulmonary disease, unspecified: Secondary | ICD-10-CM

## 2013-02-25 NOTE — Patient Instructions (Addendum)
Whenever you aren't doing 100% better take symbicort 160 Take 2 puffs first thing in am and then another 2 puffs about 12 hours later.  And use the pepcid 20 mg one at a time and if that's not working, add back singulair   If you are satisfied with your treatment plan let your doctor know and he/she can either refill your medications or you can return here when your prescription runs out.     If in any way you are not 100% satisfied,  please tell us.  If 100% better, tell your friends!

## 2013-02-25 NOTE — Progress Notes (Signed)
Subjective:    Patient ID: [name], male    DOB: Mar 18, 1932   MRN: [id number]    Brief patient profile:  79   yowm quit smoking completely around 1980 (relatively light) with cough and short of breath eval by  Dr Sheppard Penton  Then [name] dx of copd/ cb  pft's showed fev1 116% 02/2010 though ratio 66 c/w GOLD I severity.   History of Present Illness  07/05/2010 ov cc recurrent pna's since June 2011 cc not back to baseline in terms of activities he enjoyed in May, for example  Could do some yardwork  and rarely  Needed saba and no need for any maint rx,   but on  spriva for sob and still frequent tightness generalized front more than back,  Equal both sides  low grade fever and mucus gets thick and yellow> admit to [facility] April 24-27 by [facility] dx of ? Pna.   No sob at rest.   Continue protonix 40 mg  Take 30-60 min before first meal of the day and Pepcid 20 mg at bedtime GERD (REFLUX) diet Try symbicort 160 Take 2 puffs bid and work on hfa technique  08/24/2010 ov/[name] cc breathing and cough better despite L rib fx 07/22/10. Main concern is how to prevent future exacerbations.  No limiting sob but not as active since quit smoking. Doing so much better overall rec  Ok to stop spiriva unless it reduces your desired activity performance (it's like high octane fuel for your lungs so your can be all you can be) Continue symbicort 160 Take 2 puffs first thing in am and then another 2 puffs about 12 hours later.    In event of recurrent signs of infection:  Change in sputum, fever, cough, short of breath or sore throat go ahead and take a course of doxycycline - if this doesn't help we need to see you in [place] ([facility]/ [name]) F/u prn       07/12/2011 f/u ov/[facility] cc dyspnea and cough "the best they've been in years"  here for preop eval for L hip on 23rd May by Charlann Boxer,  used 2 rounds of abx since last ov for purulent sputum which cleared  and symbicort 2 bid and one mucinex and generally sleeping  well,  But since pollen season increased sub wheezing so started singulair > resolved.  rec Ok for surgery  Between now and then be sure to continue the singulair each day and Pepcid each night.    09/24/2012 f/u ov/[facility] doxy 4/10  Chief Complaint  Patient presents with  . Follow-up    Pt states that overall his breathing is doing okay- he had hip replaced-rt on 09/07/12 and afterwards had some respiratory symptoms that are now clearing up taking doxycycline.Still has occ prod cough with light yellow sputum.    no singulair or pepcid at present, not on symbicort either, misunderstood instructions/ contingencies Was taking albuterol up to every 4 hours and now down to twice daily with relief of cough and sob  rec Plan A = automatic = symbicort 160 2puffs each am and protonix 40 mg Take 30-60 min before first meal of the day  For cough and congestion, mucinex dose = 1200 mg every 12 hours and add pain killer to the mucinex if needed For nasty mucus > doxy x 10 days For any respiratory symptom flare, take max dose of symbiocort 160 Take 2 puffs first thing in am and then another 2 puffs about 12  hours later.  and singulair and pepcid 20 mg at bedtime.  Only use your albuterol as a rescue medication to be used if you can't catch your breath      02/25/2013 f/u ov/[facility] re: copd GOLD I  Chief Complaint  Patient presents with  . Follow-up    Pt states his breathing is stable.  Pt c/o chest congestion when laying down, SOB with activity and bending over.   No need for saba as long as taking symbiocort and only using it in am  Congestion at hs not using either symbicort or pepcid as part of the action plan as prev recommended No discolored sputum. Activities not limited by sob  No obvious daytime variabilty or assoc   chest tightness, subjective wheeze overt sinus or hb symptoms. No unusual exp hx or h/o childhood pna/ asthma or knowledge of premature birth.    Sleeping ok without nocturnal   or early am exacerbation  of respiratory  c/o's or need for noct saba. Also denies any obvious fluctuation of symptoms with weather or environmental changes or other aggravating or alleviating factors except as outlined above.  Current Medications, Allergies, Past Medical History, Past Surgical History, Family History, and Social History were reviewed in [employer] record.  ROS  The following are not active complaints unless bolded sore throat, dysphagia, dental problems, itching, sneezing,  nasal congestion or excess/ purulent secretions, ear ache,   fever, chills, sweats, unintended wt loss, pleuritic or exertional cp, hemoptysis,  orthopnea pnd or leg swelling, presyncope, palpitations, heartburn, abdominal pain, anorexia, nausea, vomiting, diarrhea  or change in bowel or urinary habits, change in stools or urine, dysuria,hematuria,  rash, arthralgias, visual complaints, headache, numbness weakness or ataxia or problems with walking  Improving s/p hip surgery, or coordination,  change in mood/affect or memory.             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 Bonchiectasis RLL- CT 2009 > not verified on CT 07/22/10   Past Surgical History:  Inguinal herniorrhaphy  PTCA/stent  LEFT & RIGHT FOOT RECONSTRUCTION  hiatal hernia repair 2009   Family History:  neg for pituitary dz  mother died age 11 from phlebitis  father died age 45 from heart failure  8 siblings  alive age 64,80,89,92  1 died  age 62 heart failure  3 died age 53,76,90 from heart problems    Social History:  married  2 children  retiired  never smoked  no etoh         Objective:   Physical Exam Pleasant amb wm nad wt 167 07/05/2010  > 168 08/24/2010 > 04/05/2011 174> 07/12/2011  176> 09/24/2012  169 > 02/25/2013  175  HEENT mild turbinate edema.  Oropharynx no thrush or excess pnd or cobblestoning.  No JVD or cervical adenopathy. Min accessory muscle hypertrophy. Trachea midline, nl thryroid. Chest was min hyperinflated by percussion with diminished breath sounds and moderate increased exp time with min exp bilteral sonorous rhonchi. Hoover sign positive at end inspiration. Regular rate and rhythm without murmur gallop or rub  or increase P2 or edema.  Abd: no hsm, nl excursion. Ext warm without cyanosis or clubbing.        CTa chest 09/20/12  1. Negative for pulmonary embolism to the level of the bilateral  subsegmental pulmonary arteries.  2. Grossly unchanged fusiform aneurysmal dilatation of the  ascending thoracic aorta measuring approximately 45 mm in diameter,  similar to the 06/2010 examination.  3. Coronary artery calcifications.  4. Interval worsening of previously noted bilateral lower lobe  bronchial wall thickening and mucoid impaction worrisome for  continued aspiration. Further evaluation with formal speech  swallow examination may be performed as clinically indicated. .   Assessment & Plan:

## 2013-02-26 ENCOUNTER — Other Ambulatory Visit: Payer: Self-pay | Admitting: Internal Medicine

## 2013-02-26 NOTE — Assessment & Plan Note (Addendum)
-   Spirometry 03/30/2010 FEV1  2.86 (116%) ratio 66   Noct symptoms suggest poor control.  DDX of  difficult airways managment all start with A and  include Adherence, Ace Inhibitors, Acid Reflux, Active Sinus Disease, Alpha 1 Antitripsin deficiency, Anxiety masquerading as Airways dz,  ABPA,  allergy(esp in young), Aspiration (esp in elderly), Adverse effects of DPI,  Active smokers, plus two Bs  = Bronchiectasis and Beta blocker use..and one C= CHF   Adherence is always the initial "prime suspect" and is a multilayered concern that requires a "trust but verify" approach in every patient - starting with knowing how to use medications, especially inhalers, correctly, keeping up with refills and understanding the fundamental difference between maintenance and prns vs those medications only taken for a very short course and then stopped and not refilled.  - The proper method of use, as well as anticipated side effects, of a metered-dose inhaler are discussed and demonstrated to the patient. Improved effectiveness after extensive coaching during this visit to a level of approximately  95% from baseline of 75% - needs to remember to use max dose of symbicort for any resp flare (noct cough would count)  ? Acid (or non-acid) GERD > always difficult to exclude as up to 75% of pts in some series report no assoc GI/ Heartburn symptoms> rec noct    acid suppression and diet restrictions/ reviewed and instructions given in writing.

## 2013-03-01 ENCOUNTER — Telehealth: Payer: Self-pay | Admitting: *Deleted

## 2013-03-01 NOTE — Telephone Encounter (Signed)
error 

## 2013-03-02 ENCOUNTER — Encounter: Payer: Self-pay | Admitting: Family Medicine

## 2013-03-21 ENCOUNTER — Ambulatory Visit (INDEPENDENT_AMBULATORY_CARE_PROVIDER_SITE_OTHER): Payer: Medicare Other | Admitting: Family Medicine

## 2013-03-21 ENCOUNTER — Encounter: Payer: Self-pay | Admitting: Family Medicine

## 2013-03-21 VITALS — BP 111/71 | HR 97 | Temp 98.4°F | Ht 67.0 in | Wt 172.0 lb

## 2013-03-21 DIAGNOSIS — N508 Other specified disorders of male genital organs: Secondary | ICD-10-CM

## 2013-03-21 DIAGNOSIS — N5089 Other specified disorders of the male genital organs: Secondary | ICD-10-CM

## 2013-03-21 MED ORDER — CIPROFLOXACIN HCL 250 MG PO TABS: 250.0000 mg | ORAL_TABLET | Freq: Two times a day (BID) | ORAL | Status: DC

## 2013-03-21 NOTE — Patient Instructions (Signed)
Scrotal Masses  Scrotal swelling is common in men of all ages. Common types of testicular masses include:    Hydrocele. The most common benign testicular mass in an adult. Hydroceles are generally soft and painless collections of fluid in the scrotal sac. These can rapidly change size as the fluid enters or leaves. Hydroceles can be associated with an underlying cancer of the testicle.   Spermatoceles. Generally soft and painless cyst-like masses in the scrotum that contain fluid, usually above the testicle. They can rapidly change size as the fluid enters or leaves. They are more prominent while standing or exercising. Sometimes, spermatoceles may cause a sensation of heaviness or a dull ache.   Orchitis. Inflammation of the testicle. It is painful and may be associated with a fever or symptoms of a urinary tract infection, including frequent and painful urination. It is common in males who have the mumps.   Varicocele. An enlargement of the veins that drain the testicles. Varicoceles usually occur on the left side of the scrotum. This condition can increase the risk of infertility. Varicocele is sometimes more prominent while standing or exercising. Sometimes, varicoceles may cause a sensation of heaviness or a dull ache.   Inguinal hernia. A bulge caused by a portion of intestine protruding into the scrotum through a weak area in the abdominal muscles. Hernias may or may not be painful. They are soft and usually enlarge with coughing or straining.   Torsion of the testis. This can cause a testicular mass that develops quickly and is associated with tenderness or fever, or both. It is caused by a twisting of the testicle within the sac. It also reduces the blood supply and can destroy the testis if not treated quickly with surgery.   Epididymitis. Inflammation of the epididymis (a structure attached above and behind the testicle), usually caused by a urinary tract infection or a sexually transmitted  infection. This generally shows up as testicular discomfort and swelling and may include pain during urination. It is frequently associated with a testicle infection.   Testicular appendages. Remnants of tissue on the testis present since birth. A testicular appendage can twist on its blood supply and cause pain. In most cases, this is seen as a blue dot on the scrotum.   Hematocele. A collection of blood between the layers of the sac inside the scrotum. It usually is caused by trauma to the scrotum.   Sebaceous cysts. These can be a swelling in the skin of the scrotum and are usually painless.   Cancer (carcinoma) of the skin of the scrotum. It can cause scrotal swelling, but this is rare.  Document Released: 08/21/2002 Document Revised: 10/17/2012 Document Reviewed: 08/06/2012  [employer] Patient Information 2014 [employer].

## 2013-03-21 NOTE — Progress Notes (Signed)
   Subjective:    Patient ID: [name], male    DOB: September 24, 1932, 80 y.o.   MRN: [id number]  HPI This 80 y.o. male presents for evaluation of left testicular mass and pain. He has had this for a week now.   Review of Systems C/o left testicular mass and pain No chest pain, SOB, HA, dizziness, vision change, N/V, diarrhea, constipation, dysuria, urinary urgency or frequency, myalgias, arthralgias or rash.     Objective:   Physical Exam Vital signs noted  Well developed well nourished male.  HEENT - Head atraumatic Normocephalic                Eyes - PERRLA, Conjuctiva - clear Sclera- Clear EOMI                Ears - EAC's Wnl TM's Wnl Gross Hearing WNL Respiratory - Lungs CTA bilateral Cardiac - RRR S1 and S2 without murmur GU - Left testicle with mass measuring approx 2 cm and tender.       Assessment & Plan:  Testicular mass - Plan: ciprofloxacin (CIPRO) 250 MG tablet, US Scrotum Cipro 250mg  one po bid x 14 days. Wear jock strap  Lysbeth Penner FNP

## 2013-03-22 ENCOUNTER — Telehealth: Payer: Self-pay | Admitting: Family Medicine

## 2013-03-25 ENCOUNTER — Telehealth: Payer: Self-pay | Admitting: Family Medicine

## 2013-03-25 ENCOUNTER — Other Ambulatory Visit: Payer: Self-pay | Admitting: Family Medicine

## 2013-03-25 ENCOUNTER — Encounter: Payer: Self-pay | Admitting: Family Medicine

## 2013-03-25 DIAGNOSIS — N5089 Other specified disorders of the male genital organs: Secondary | ICD-10-CM

## 2013-03-25 NOTE — Telephone Encounter (Signed)
Patient got in touch with bill through mychart and had this taken care of

## 2013-03-25 NOTE — Telephone Encounter (Signed)
Patient got in touch with mychart and talked to bill

## 2013-03-26 ENCOUNTER — Encounter: Payer: Self-pay | Admitting: Family Medicine

## 2013-03-28 ENCOUNTER — Other Ambulatory Visit: Payer: Self-pay | Admitting: Family Medicine

## 2013-03-31 ENCOUNTER — Encounter: Payer: Self-pay | Admitting: Family Medicine

## 2013-04-18 ENCOUNTER — Emergency Department (HOSPITAL_COMMUNITY): Payer: Medicare Other

## 2013-04-18 ENCOUNTER — Encounter (HOSPITAL_COMMUNITY): Payer: Self-pay | Admitting: Emergency Medicine

## 2013-04-18 ENCOUNTER — Inpatient Hospital Stay (HOSPITAL_COMMUNITY)
Admission: EM | Admit: 2013-04-18 | Discharge: 2013-04-23 | DRG: 380 | Disposition: A | Discharge status: Home / Self Care | Payer: Medicare Other | Attending: Internal Medicine | Admitting: Internal Medicine

## 2013-04-18 DIAGNOSIS — D126 Benign neoplasm of colon, unspecified: Secondary | ICD-10-CM

## 2013-04-18 DIAGNOSIS — Z8249 Family history of ischemic heart disease and other diseases of the circulatory system: Secondary | ICD-10-CM

## 2013-04-18 DIAGNOSIS — Z8719 Personal history of other diseases of the digestive system: Secondary | ICD-10-CM | POA: Diagnosis present

## 2013-04-18 DIAGNOSIS — K2211 Ulcer of esophagus with bleeding: Secondary | ICD-10-CM

## 2013-04-18 DIAGNOSIS — Z79899 Other long term (current) drug therapy: Secondary | ICD-10-CM

## 2013-04-18 DIAGNOSIS — D72829 Elevated white blood cell count, unspecified: Secondary | ICD-10-CM | POA: Diagnosis present

## 2013-04-18 DIAGNOSIS — K449 Diaphragmatic hernia without obstruction or gangrene: Secondary | ICD-10-CM | POA: Diagnosis present

## 2013-04-18 DIAGNOSIS — D539 Nutritional anemia, unspecified: Secondary | ICD-10-CM | POA: Diagnosis present

## 2013-04-18 DIAGNOSIS — Z96649 Presence of unspecified artificial hip joint: Secondary | ICD-10-CM

## 2013-04-18 DIAGNOSIS — Z860100 Personal history of colon polyps, unspecified: Secondary | ICD-10-CM

## 2013-04-18 DIAGNOSIS — Z880 Allergy status to penicillin: Secondary | ICD-10-CM

## 2013-04-18 DIAGNOSIS — E039 Hypothyroidism, unspecified: Secondary | ICD-10-CM | POA: Diagnosis present

## 2013-04-18 DIAGNOSIS — D649 Anemia, unspecified: Secondary | ICD-10-CM

## 2013-04-18 DIAGNOSIS — R0600 Dyspnea, unspecified: Secondary | ICD-10-CM

## 2013-04-18 DIAGNOSIS — Z7982 Long term (current) use of aspirin: Secondary | ICD-10-CM

## 2013-04-18 DIAGNOSIS — D62 Acute posthemorrhagic anemia: Secondary | ICD-10-CM | POA: Diagnosis present

## 2013-04-18 DIAGNOSIS — J438 Other emphysema: Secondary | ICD-10-CM | POA: Diagnosis present

## 2013-04-18 DIAGNOSIS — N5089 Other specified disorders of the male genital organs: Secondary | ICD-10-CM | POA: Diagnosis present

## 2013-04-18 DIAGNOSIS — K921 Melena: Secondary | ICD-10-CM

## 2013-04-18 DIAGNOSIS — D352 Benign neoplasm of pituitary gland: Secondary | ICD-10-CM

## 2013-04-18 DIAGNOSIS — I2510 Atherosclerotic heart disease of native coronary artery without angina pectoris: Secondary | ICD-10-CM | POA: Diagnosis present

## 2013-04-18 DIAGNOSIS — I959 Hypotension, unspecified: Secondary | ICD-10-CM | POA: Diagnosis present

## 2013-04-18 DIAGNOSIS — K219 Gastro-esophageal reflux disease without esophagitis: Secondary | ICD-10-CM | POA: Diagnosis present

## 2013-04-18 DIAGNOSIS — Z888 Allergy status to other drugs, medicaments and biological substances status: Secondary | ICD-10-CM

## 2013-04-18 DIAGNOSIS — M129 Arthropathy, unspecified: Secondary | ICD-10-CM | POA: Diagnosis present

## 2013-04-18 DIAGNOSIS — K922 Gastrointestinal hemorrhage, unspecified: Secondary | ICD-10-CM | POA: Diagnosis present

## 2013-04-18 DIAGNOSIS — D353 Benign neoplasm of craniopharyngeal duct: Secondary | ICD-10-CM

## 2013-04-18 DIAGNOSIS — J9600 Acute respiratory failure, unspecified whether with hypoxia or hypercapnia: Secondary | ICD-10-CM | POA: Diagnosis present

## 2013-04-18 DIAGNOSIS — R0609 Other forms of dyspnea: Secondary | ICD-10-CM

## 2013-04-18 LAB — URINALYSIS, ROUTINE W REFLEX MICROSCOPIC
Bilirubin Urine: NEGATIVE
Glucose, UA: NEGATIVE mg/dL
Ketones, ur: NEGATIVE mg/dL
Leukocytes, UA: NEGATIVE
Nitrite: NEGATIVE
Protein, ur: NEGATIVE mg/dL
Specific Gravity, Urine: 1.022 (ref 1.005–1.030)
Urobilinogen, UA: 0.2 mg/dL (ref 0.0–1.0)
pH: 5 (ref 5.0–8.0)

## 2013-04-18 LAB — CBC WITH DIFFERENTIAL/PLATELET
Basophils Absolute: 0.1 10*3/uL (ref 0.0–0.1)
Basophils Relative: 1 % (ref 0–1)
Eosinophils Absolute: 0 10*3/uL (ref 0.0–0.7)
Eosinophils Relative: 0 % (ref 0–5)
HCT: 23.4 % — ABNORMAL LOW (ref 39.0–52.0)
Hemoglobin: 7.6 g/dL — ABNORMAL LOW (ref 13.0–17.0)
Lymphocytes Relative: 8 % — ABNORMAL LOW (ref 12–46)
Lymphs Abs: 0.9 10*3/uL (ref 0.7–4.0)
MCH: 31.7 pg (ref 26.0–34.0)
MCHC: 32.5 g/dL (ref 30.0–36.0)
MCV: 97.5 fL (ref 78.0–100.0)
Monocytes Absolute: 1.2 10*3/uL — ABNORMAL HIGH (ref 0.1–1.0)
Monocytes Relative: 10 % (ref 3–12)
Neutro Abs: 9.5 10*3/uL — ABNORMAL HIGH (ref 1.7–7.7)
Neutrophils Relative %: 81 % — ABNORMAL HIGH (ref 43–77)
Platelets: 248 10*3/uL (ref 150–400)
RBC: 2.4 MIL/uL — ABNORMAL LOW (ref 4.22–5.81)
RDW: 16.2 % — ABNORMAL HIGH (ref 11.5–15.5)
WBC: 11.7 10*3/uL — ABNORMAL HIGH (ref 4.0–10.5)

## 2013-04-18 LAB — BASIC METABOLIC PANEL
BUN: 36 mg/dL — ABNORMAL HIGH (ref 6–23)
CO2: 21 mEq/L (ref 19–32)
Calcium: 8.2 mg/dL — ABNORMAL LOW (ref 8.4–10.5)
Chloride: 105 mEq/L (ref 96–112)
Creatinine, Ser: 0.82 mg/dL (ref 0.50–1.35)
GFR calc Af Amer: >90 mL/min (ref >90)
GFR calc non Af Amer: 83 mL/min — ABNORMAL LOW (ref >90)
Glucose, Bld: 128 mg/dL — ABNORMAL HIGH (ref 70–99)
Potassium: 5 mEq/L (ref 3.7–5.3)
Sodium: 137 mEq/L (ref 137–147)

## 2013-04-18 LAB — URINE MICROSCOPIC-ADD ON

## 2013-04-18 LAB — POC OCCULT BLOOD, ED: Fecal Occult Bld: NEGATIVE

## 2013-04-18 LAB — OCCULT BLOOD X 1 CARD TO LAB, STOOL: Fecal Occult Bld: POSITIVE — AB

## 2013-04-18 LAB — PREPARE RBC (CROSSMATCH)

## 2013-04-18 LAB — I-STAT TROPONIN, ED: Troponin i, poc: 0 ng/mL (ref 0.00–0.08)

## 2013-04-18 LAB — APTT: aPTT: 33 seconds (ref 24–37)

## 2013-04-18 LAB — PRO B NATRIURETIC PEPTIDE: Pro B Natriuretic peptide (BNP): 261.9 pg/mL (ref 0–450)

## 2013-04-18 LAB — PROTIME-INR
INR: 1.2 (ref 0.00–1.49)
Prothrombin Time: 14.9 seconds (ref 11.6–15.2)

## 2013-04-18 LAB — MRSA PCR SCREENING: MRSA by PCR: NEGATIVE

## 2013-04-18 MED ORDER — IPRATROPIUM-ALBUTEROL 0.5-2.5 (3) MG/3ML IN SOLN
3.0000 mL | Freq: Once | RESPIRATORY_TRACT | Status: AC
  Administered 2013-04-18: 3 mL via RESPIRATORY_TRACT
  Filled 2013-04-18: qty 3

## 2013-04-18 MED ORDER — ASPIRIN 81 MG PO CHEW
324.0000 mg | CHEWABLE_TABLET | Freq: Once | ORAL | Status: AC
  Administered 2013-04-18: 324 mg via ORAL
  Filled 2013-04-18: qty 4

## 2013-04-18 MED ORDER — MONTELUKAST SODIUM 10 MG PO TABS
10.0000 mg | ORAL_TABLET | Freq: Every day | ORAL | Status: DC | PRN
  Filled 2013-04-18: qty 1

## 2013-04-18 MED ORDER — SODIUM CHLORIDE 0.9 % IV BOLUS (SEPSIS)
500.0000 mL | Freq: Once | INTRAVENOUS | Status: AC
  Administered 2013-04-18: 500 mL via INTRAVENOUS

## 2013-04-18 MED ORDER — LEVOTHYROXINE SODIUM 50 MCG PO TABS
50.0000 ug | ORAL_TABLET | Freq: Every day | ORAL | Status: DC
  Administered 2013-04-19 – 2013-04-23 (×5): 50 ug via ORAL
  Filled 2013-04-18 (×6): qty 1

## 2013-04-18 MED ORDER — FERROUS SULFATE 325 (65 FE) MG PO TABS
325.0000 mg | ORAL_TABLET | Freq: Every day | ORAL | Status: DC
  Administered 2013-04-18 – 2013-04-23 (×6): 325 mg via ORAL
  Filled 2013-04-18 (×6): qty 1

## 2013-04-18 MED ORDER — BROMOCRIPTINE MESYLATE 2.5 MG PO TABS
2.5000 mg | ORAL_TABLET | Freq: Every day | ORAL | Status: DC
  Administered 2013-04-18 – 2013-04-22 (×5): 2.5 mg via ORAL
  Filled 2013-04-18 (×6): qty 1

## 2013-04-18 MED ORDER — BUDESONIDE-FORMOTEROL FUMARATE 160-4.5 MCG/ACT IN AERO
2.0000 | INHALATION_SPRAY | Freq: Two times a day (BID) | RESPIRATORY_TRACT | Status: DC
  Administered 2013-04-18 – 2013-04-23 (×9): 2 via RESPIRATORY_TRACT
  Filled 2013-04-18: qty 6

## 2013-04-18 MED ORDER — TRAMADOL HCL 50 MG PO TABS
50.0000 mg | ORAL_TABLET | Freq: Four times a day (QID) | ORAL | Status: DC | PRN
  Filled 2013-04-18: qty 1

## 2013-04-18 MED ORDER — PANTOPRAZOLE SODIUM 40 MG IV SOLR
40.0000 mg | Freq: Two times a day (BID) | INTRAVENOUS | Status: DC
  Administered 2013-04-18 – 2013-04-23 (×10): 40 mg via INTRAVENOUS
  Filled 2013-04-18 (×11): qty 40

## 2013-04-18 MED ORDER — MAGNESIUM OXIDE 400 (241.3 MG) MG PO TABS
400.0000 mg | ORAL_TABLET | Freq: Every day | ORAL | Status: DC
  Administered 2013-04-19 – 2013-04-23 (×5): 400 mg via ORAL
  Filled 2013-04-18 (×5): qty 1

## 2013-04-18 MED ORDER — TAMSULOSIN HCL 0.4 MG PO CAPS
0.4000 mg | ORAL_CAPSULE | Freq: Every day | ORAL | Status: DC
  Administered 2013-04-18 – 2013-04-23 (×6): 0.4 mg via ORAL
  Filled 2013-04-18 (×6): qty 1

## 2013-04-18 MED ORDER — SODIUM CHLORIDE 0.9 % IJ SOLN
3.0000 mL | Freq: Two times a day (BID) | INTRAMUSCULAR | Status: DC
  Administered 2013-04-19 – 2013-04-23 (×8): 3 mL via INTRAVENOUS

## 2013-04-18 MED ORDER — ONDANSETRON HCL 4 MG/2ML IJ SOLN: 4.0000 mg | Freq: Four times a day (QID) | INTRAMUSCULAR | Status: DC | PRN

## 2013-04-18 MED ORDER — NAPHAZOLINE HCL 0.1 % OP SOLN
1.0000 [drp] | Freq: Four times a day (QID) | OPHTHALMIC | Status: DC | PRN
  Filled 2013-04-18: qty 15

## 2013-04-18 MED ORDER — SODIUM CHLORIDE 0.9 % IV BOLUS (SEPSIS)
1000.0000 mL | Freq: Once | INTRAVENOUS | Status: AC
  Administered 2013-04-18: 1000 mL via INTRAVENOUS

## 2013-04-18 MED ORDER — NITROGLYCERIN 0.4 MG SL SUBL: 0.4000 mg | SUBLINGUAL_TABLET | SUBLINGUAL | Status: DC | PRN

## 2013-04-18 MED ORDER — ONDANSETRON HCL 4 MG PO TABS
4.0000 mg | ORAL_TABLET | Freq: Four times a day (QID) | ORAL | Status: DC | PRN
  Administered 2013-04-18: 4 mg via ORAL
  Filled 2013-04-18: qty 1

## 2013-04-18 NOTE — ED Provider Notes (Signed)
TIME SEEN: 2:05 PM  CHIEF COMPLAINT: Shortness of breath, nausea  HPI: Patient is a 80 year old male with a history of COPD, coronary artery disease status post stent, chronic anemia on iron, hypothyroidism who presents to the emergency department with shortness of breath with exertion that started this morning and nausea. He denies any chest pain or chest discomfort. He is also had some dizziness. Patient denies any wheezing, cough or fever. He denies any prior history of PE or DVT.  He reports that his symptoms were similar to his prior symptoms of coronary artery disease. He is also concerned about pulmonary embolus as he has had intermittent leg swelling in the past but has had negative CT of his chest and lower extremity Dopplers.  PCP is Dr. Laurance Flatten with [facility]ologist is Dr. Percival Spanish  ROS: See HPI Constitutional: no fever  Eyes: no drainage  ENT: no runny nose   Cardiovascular:  no chest pain  Resp: SOB  GI: no vomiting GU: no dysuria Integumentary: no rash  Allergy: no hives  Musculoskeletal: no leg swelling  Neurological: no slurred speech ROS otherwise negative  PAST MEDICAL HISTORY/PAST SURGICAL HISTORY:  Past Medical History  Diagnosis Date  . Emphysema   . CAD (coronary artery disease)     left main had  a 20% stenosis.  Left anterior descending had 40-50% proximal   stenosis.  Left circumflex had an 80% proximal stenosis.  The  first obtuse marginal artery had a 60% mid stenosis.  The  right  coronary artery  had  a 50-60% proximal stenosis with a 60-70%  mid stenosis  The patient was managed medically.   Marland Kitchen COPD (chronic obstructive pulmonary disease)   . Hypothyroidism   . Anemia   . Blood transfusion june 2011, 5 units given    july 2011 some units given  . Enlarged prostate     elevated psa recently  . GERD (gastroesophageal reflux disease)   . Arthritis   . Complication of anesthesia     likes spinal due to copd  . Dizziness     occasional   . Shortness of breath     with exertion    MEDICATIONS:  Prior to Admission medications   Medication Sig Start Date End Date Taking? Authorizing Provider  albuterol (PROVENTIL HFA;VENTOLIN HFA) 108 (90 BASE) MCG/ACT inhaler Inhale 2 puffs into the lungs every 6 (six) hours as needed for wheezing or shortness of breath.    Historical Provider, MD  aspirin EC 81 MG tablet Take 81 mg by mouth daily.    Historical Provider, MD  bromocriptine (PARLODEL) 2.5 MG tablet 1 po qhs    Historical Provider, MD  budesonide-formoterol (SYMBICORT) 160-4.5 MCG/ACT inhaler Inhale 2 puffs into the lungs 2 (two) times daily.    Historical Provider, MD  cholecalciferol (VITAMIN D) 1000 UNITS tablet Take 1,000 Units by mouth 2 (two) times daily.    Historical Provider, MD  ciprofloxacin (CIPRO) 250 MG tablet Take 1 tablet (250 mg total) by mouth 2 (two) times daily. 03/21/13   Lysbeth Penner, FNP  ferrous sulfate 325 (65 FE) MG tablet Take 325 mg by mouth daily.     Historical Provider, MD  guaiFENesin (MUCINEX) 600 MG 12 hr tablet Take 600 mg by mouth 2 (two) times daily.     Historical Provider, MD  levothyroxine (SYNTHROID, LEVOTHROID) 50 MCG tablet TAKE 1 TABLET DAILY 10/04/12   Renato Shin, MD  montelukast (SINGULAIR) 10 MG tablet Take 10 mg  by mouth daily as needed (for allergies).     Historical Provider, MD  nitroGLYCERIN (NITROSTAT) 0.4 MG SL tablet Place 0.4 mg under the tongue every 5 (five) minutes as needed for chest pain.    Historical Provider, MD  pantoprazole (PROTONIX) 40 MG tablet TAKE  (1)  TABLET TWICE A DAY. 03/28/13   Chipper Herb, MD  SYMBICORT 160-4.5 MCG/ACT inhaler 2 PUFFS EVERY 12 HOURS AS DIRECTED 02/26/13   Tanda Rockers, MD  tamsulosin (FLOMAX) 0.4 MG CAPS Take 1 capsule (0.4 mg total) by mouth daily. 09/11/12   Mcarthur Rossetti, MD  traMADol (ULTRAM) 50 MG tablet Take 50 mg by mouth every 6 (six) hours as needed for pain.    Historical Provider, MD    ALLERGIES:  Allergies   Allergen Reactions  . Lortab [Hydrocodone-Acetaminophen] Nausea And Vomiting  . Penicillins Other (See Comments)    Light headed  . Zetia [Ezetimibe]     Muscle weakness  . Zocor [Simvastatin] Other (See Comments)    Nausea, muscle weakness    SOCIAL HISTORY:  History  Substance Use Topics  . Smoking status: Never Smoker   . Smokeless tobacco: Never Used  . Alcohol Use: No    FAMILY HISTORY: Family History  Problem Relation Age of Onset  . Heart disease Sister   . Heart failure Father   . Colon cancer Neg Hx     EXAM: BP 113/67  Pulse 114  Temp(Src) 97.6 F (36.4 C) (Oral)  Resp 30  SpO2 95% CONSTITUTIONAL: Alert and oriented and responds appropriately to questions. Well-appearing; well-nourished, elderly HEAD: Normocephalic EYES: Conjunctivae clear, PERRL ENT: normal nose; no rhinorrhea; moist mucous membranes; pharynx without lesions noted NECK: Supple, no meningismus, no LAD  CARD: Regular and tachycardic; S1 and S2 appreciated; no murmurs, no clicks, no rubs, no gallops RESP: Normal chest excursion without splinting; patient is tachypneic, breath sounds clear and equal bilaterally; no wheezes, no rhonchi, no rales, slightly diminished at his bases bilaterally ABD/GI: Normal bowel sounds; non-distended; soft, non-tender, no rebound, no guarding RECTAL:  Normal tone, no gross blood, stool is dark but this is chronic per patient due to taking iron chronically BACK:  The back appears normal and is non-tender to palpation, there is no CVA tenderness EXT: Normal ROM in all joints; non-tender to palpation; no edema; normal capillary refill; no cyanosis    SKIN: Normal color for age and race; warm NEURO: Moves all extremities equally PSYCH: The patient's mood and manner are appropriate. Grooming and personal hygiene are appropriate.  MEDICAL DECISION MAKING: Patient here with tachycardia, tachypnea and shortness of breath with exertion. Per EMS, patient was initially  mildly hypoxic. He is now on room air the emergency department with sats of 95-97%. He is however tachycardic and tachypneic. He does have slightly diminished breath sounds at his bases but no wheezing or rhonchi or rales appreciated. Concern for possible pulmonary embolus, pneumonia, ACS. We'll obtain cardiac labs, chest x-ray and CT of his chest. Will give duo neb. Patient will need admission.  ED PROGRESS: Patient with episode of hypotension. His hemoglobin is 7.6. His blood pressure is improved after IV fluids. Have consented for blood transfusion. Will give 3 units pRBCs. His abdominal exam is benign. He reports he has had 2 episodes of GI bleed in the past and is followed by [facility].  Last colonoscopy was 2-3 years ago.  Given we have a cause for his symptoms, I feel we can hold on CT  chest at this time. His Hemoccult is negative. Patient will need admission. Will discuss with hospitalist.   EKG Interpretation    Date/Time:  Thursday April 18 2013 13:58:53 EST Ventricular Rate:  112 PR Interval:  134 QRS Duration: 85 QT Interval:  334 QTC Calculation: 456 R Axis:   59 Text Interpretation:  Sinus tachycardia RSR' in V1 or V2, right VCD or RVH No significant change since last tracing Confirmed by WARD  DO, KRISTEN ([id number]) on 04/18/2013 2:04:24 PM               [place], DO 04/19/13 1418

## 2013-04-18 NOTE — H&P (Signed)
[facility] History and Physical  [name] NOM:[id number] DOB: 08-16-1932 DOA: 04/18/2013  Referring physician: ED physician PCP: Redge Gainer, MD   Chief Complaint: shortness of breath   HPI:  80 year old male with a history of COPD, coronary artery disease status post stenting, chronic anemia on iron, hypothyroidism who presented to the emergency department with main concern of several days duration of progressively worsening shortness of breath, mostly exertional but now present at rest as well, associated with nausea, poor oral intake, malaise, dizziness. He denies chest pain, no fevers, chills, no abdominal or urinary concerns, no recent sicknesses/hospitalizations, no sick contacts/exposures, no focal neurological symptoms.  In ED, pt stable but found to have initial oxygen saturation in high 80's which quickle improved to mid 90's with oxygen. Hg noted to be 7.6. [facility] asked to admit to telemetry bed for further evaluation. 3 U of PRBC ordered in ED for transfusion.   Assessment and Plan: Active Problems: Acute hypoxic respiratory failure - most likely secondary to acute blood loss anemia of unclear etiology - will admit to telemetry unit - check FOBT, anemia panel - proceed with transfusion and consider GI consult based on the above test results - CBC in AM Acute blood loss anemia  - unclear etiology - check FOBT, anemia panel - last colonoscopy by Dr. Philip Aspen in 2012 with diverticulosis and otherwise normal colonoscopy - hold all heparin products due to high risk bleed - place on SCD's for DVT prophylaxis  Hypotension - secondary to acute blood loss anemia - proceed with blood transfusion and monitor on telemetry  Leukocytosis - appears to be secondary to stress reaction - no signs of an infectious etiology - will ask for UA - CXR unremarkable for acute events - CBC in AM   Hypothyroidism - will check TSH and continue home regimen with synthroid   Code  Status: Full Family Communication: Pt at bedside Disposition Plan: Admit to telemetry bed   Review of Systems:  Constitutional: Negative for fever, chills. Negative for diaphoresis.  HENT: Negative for hearing loss, ear pain, nosebleeds, congestion, sore throat, neck pain, tinnitus and ear discharge.   Eyes: Negative for blurred vision, double vision, photophobia, pain, discharge and redness.  Respiratory: Negative for wheezing and stridor.   Cardiovascular: Negative for chest pain, palpitations, orthopnea, claudication.  Gastrointestinal: Negative for heartburn, constipation, blood in stool and melena.  Genitourinary: Negative for dysuria, urgency, frequency, hematuria and flank pain.  Musculoskeletal: Negative for myalgias, back pain, joint pain and falls.  Skin: Negative for itching and rash.  Neurological: Negative for tingling, tremors, sensory change, speech change, focal weakness, loss of consciousness and headaches.  Endo/Heme/Allergies: Negative for environmental allergies and polydipsia. Does not bruise/bleed easily.  Psychiatric/Behavioral: Negative for suicidal ideas. The patient is not nervous/anxious.      Past Medical History  Diagnosis Date  . Emphysema   . CAD (coronary artery disease)     left main had  a 20% stenosis.  Left anterior descending had 40-50% proximal   stenosis.  Left circumflex had an 80% proximal stenosis.  The  first obtuse marginal artery had a 60% mid stenosis.  The  right  coronary artery  had  a 50-60% proximal stenosis with a 60-70%  mid stenosis  The patient was managed medically.   Marland Kitchen COPD (chronic obstructive pulmonary disease)   . Hypothyroidism   . Anemia   . Blood transfusion june 2011, 5 units given    july 2011 some units given  .  Enlarged prostate     elevated psa recently  . GERD (gastroesophageal reflux disease)   . Arthritis   . Complication of anesthesia     likes spinal due to copd  . Dizziness     occasional  . Shortness of  breath     with exertion  . Mass, scrotum     L scrotum - Sees Dr. Jeffie Pollock    Past Surgical History  Procedure Laterality Date  . Coronary stent placement  5 yrs ago  . Hiatal hernia repair  01-04-2008  . Foot surgery  1994 left, 2002 right foot    bilateral foot reconstruciton  . Abdominal hernia repair   2008  . Cataract extraction both eyes    . Total hip arthroplasty  07/21/2011    Procedure: TOTAL HIP ARTHROPLASTY ANTERIOR APPROACH;  Surgeon: Mauri Pole, MD;  Location: [facility];  Service: Orthopedics;  Laterality: Left;  . Coronary angioplasty    . Total hip arthroplasty Right 09/07/2012    Procedure: RIGHT TOTAL HIP ARTHROPLASTY ANTERIOR APPROACH;  Surgeon: Mcarthur Rossetti, MD;  Location: [facility];  Service: Orthopedics;  Laterality: Right;  . Foot surgery      reconstruction of both feet    Social History:  reports that he has never smoked. He has never used smokeless tobacco. He reports that he uses illicit drugs (Codeine). He reports that he does not drink alcohol.  Allergies  Allergen Reactions  . Lortab [Hydrocodone-Acetaminophen] Nausea And Vomiting  . Penicillins Other (See Comments)    Light headed  . Zetia [Ezetimibe] Other (See Comments)    Muscle weakness  . Zocor [Simvastatin] Nausea Only and Other (See Comments)    muscle weakness    Family History  Problem Relation Age of Onset  . Heart disease Sister   . Heart failure Father   . Colon cancer Neg Hx     Prior to Admission medications   Medication Sig Start Date End Date Taking? Authorizing Provider  albuterol (PROVENTIL HFA;VENTOLIN HFA) 108 (90 BASE) MCG/ACT inhaler Inhale 2 puffs into the lungs every 6 (six) hours as needed for wheezing or shortness of breath.   Yes Historical Provider, MD  aspirin EC 81 MG tablet Take 81 mg by mouth at bedtime.    Yes Historical Provider, MD  bromocriptine (PARLODEL) 2.5 MG tablet Take 2.5 mg by mouth at bedtime.    Yes Historical Provider, MD   budesonide-formoterol (SYMBICORT) 160-4.5 MCG/ACT inhaler Inhale 2 puffs into the lungs 2 (two) times daily.   Yes Historical Provider, MD  cholecalciferol (VITAMIN D) 1000 UNITS tablet Take 1,000 Units by mouth 2 (two) times daily.   Yes Historical Provider, MD  ferrous sulfate 325 (65 FE) MG tablet Take 325 mg by mouth daily.    Yes Historical Provider, MD  guaiFENesin (MUCINEX) 600 MG 12 hr tablet Take 600 mg by mouth 2 (two) times daily.    Yes Historical Provider, MD  levothyroxine (SYNTHROID, LEVOTHROID) 50 MCG tablet Take 50 mcg by mouth daily before breakfast.   Yes Historical Provider, MD  magnesium oxide (MAG-OX) 400 MG tablet Take 400 mg by mouth daily.   Yes Historical Provider, MD  montelukast (SINGULAIR) 10 MG tablet Take 10 mg by mouth daily as needed (for allergies).    Yes Historical Provider, MD  nitroGLYCERIN (NITROSTAT) 0.4 MG SL tablet Place 0.4 mg under the tongue every 5 (five) minutes as needed for chest pain.   Yes Historical Provider, MD  pantoprazole ([place])  40 MG tablet Take 40 mg by mouth daily.   Yes Historical Provider, MD  tamsulosin (FLOMAX) 0.4 MG CAPS Take 1 capsule (0.4 mg total) by mouth daily. 09/11/12  Yes Mcarthur Rossetti, MD  tetrahydrozoline (VISINE) 0.05 % ophthalmic solution Place 1 drop into both eyes as needed (For dry eyes).   Yes Historical Provider, MD  traMADol (ULTRAM) 50 MG tablet Take 50 mg by mouth every 6 (six) hours as needed for pain.   Yes Historical Provider, MD  vitamin E (VITAMIN E) 400 UNIT capsule Take 400 Units by mouth daily.   Yes Historical Provider, MD    Physical Exam: Filed Vitals:   04/18/13 1355 04/18/13 1359 04/18/13 1516  BP:  113/67 84/49  Pulse:  114 101  Temp:  97.6 F (36.4 C)   TempSrc:  Oral   Resp:  30 31  SpO2: 99% 95% 97%    Physical Exam  Constitutional: Appears well-developed and well-nourished. No distress.  HENT: Normocephalic. External right and left ear normal. Oropharynx is clear and  moist.  Eyes: Conjunctivae and EOM are normal. PERRLA, no scleral icterus.  Neck: Normal ROM. Neck supple. No JVD. No tracheal deviation. No thyromegaly.  CVS: RRR, S1/S2 +, no murmurs, no gallops, no carotid bruit.  Pulmonary: Effort and breath sounds normal, no stridor, rhonchi, wheezes, rales.  Abdominal: Soft. BS +,  no distension, tenderness, rebound or guarding.  Musculoskeletal: Normal range of motion. No edema and no tenderness.  Lymphadenopathy: No lymphadenopathy noted, cervical, inguinal. Neuro: Alert. Normal reflexes, muscle tone coordination. No cranial nerve deficit. Skin: Skin is warm and dry. No rash noted. Not diaphoretic. No erythema. No pallor.  Psychiatric: Normal mood and affect. Behavior, judgment, thought content normal.   Labs on Admission:  Basic Metabolic Panel:  Recent Labs Lab 04/18/13 1421  NA 137  K 5.0  CL 105  CO2 21  GLUCOSE 128*  BUN 36*  CREATININE 0.82  CALCIUM 8.2*   CBC:  Recent Labs Lab 04/18/13 1421  WBC 11.7*  NEUTROABS 9.5*  HGB 7.6*  HCT 23.4*  MCV 97.5  PLT 248   Radiological Exams on Admission: Dg Chest Port 1 View   04/18/2013   No active disease.     EKG: Normal sinus rhythm, no ST/T wave changes  Faye Ramsay, MD  [facility] Pager [phone]  If 7PM-7AM, please contact night-coverage [email or web address] Password [facility] 04/18/2013, 4:18 PM

## 2013-04-18 NOTE — ED Notes (Signed)
Bed: WA08 Expected date:  Expected time:  Means of arrival:  Comments: EMS- SOB, Hx of COPD

## 2013-04-18 NOTE — Progress Notes (Signed)
Utilization Review completed.  Caedmon Louque RN CM  

## 2013-04-18 NOTE — ED Notes (Signed)
Per [facility], Pt, from home, c/o SOB w/ exertion and slight dizziness starting this morning.  Denies pain.  Hx of COPD and emphysema.  Pt was 93% RA and 99% 4L .

## 2013-04-19 ENCOUNTER — Inpatient Hospital Stay (HOSPITAL_COMMUNITY): Payer: Medicare Other

## 2013-04-19 DIAGNOSIS — J9600 Acute respiratory failure, unspecified whether with hypoxia or hypercapnia: Secondary | ICD-10-CM

## 2013-04-19 LAB — CBC
HCT: 26.2 % — ABNORMAL LOW (ref 39.0–52.0)
Hemoglobin: 8.7 g/dL — ABNORMAL LOW (ref 13.0–17.0)
MCH: 30.5 pg (ref 26.0–34.0)
MCHC: 33.2 g/dL (ref 30.0–36.0)
MCV: 91.9 fL (ref 78.0–100.0)
Platelets: 162 10*3/uL (ref 150–400)
RBC: 2.85 MIL/uL — ABNORMAL LOW (ref 4.22–5.81)
RDW: 17.4 % — ABNORMAL HIGH (ref 11.5–15.5)
WBC: 9.7 10*3/uL (ref 4.0–10.5)

## 2013-04-19 LAB — BASIC METABOLIC PANEL
BUN: 24 mg/dL — ABNORMAL HIGH (ref 6–23)
CO2: 21 mEq/L (ref 19–32)
Calcium: 8.2 mg/dL — ABNORMAL LOW (ref 8.4–10.5)
Chloride: 107 mEq/L (ref 96–112)
Creatinine, Ser: 0.88 mg/dL (ref 0.50–1.35)
GFR calc Af Amer: >90 mL/min (ref >90)
GFR calc non Af Amer: 80 mL/min — ABNORMAL LOW (ref >90)
Glucose, Bld: 97 mg/dL (ref 70–99)
Potassium: 4 mEq/L (ref 3.7–5.3)
Sodium: 139 mEq/L (ref 137–147)

## 2013-04-19 LAB — IRON AND TIBC
Iron: 71 ug/dL (ref 42–135)
Saturation Ratios: 32 % (ref 20–55)
TIBC: 224 ug/dL (ref 215–435)
UIBC: 153 ug/dL (ref 125–400)

## 2013-04-19 LAB — FERRITIN: Ferritin: 68 ng/mL (ref 22–322)

## 2013-04-19 LAB — VITAMIN B12: Vitamin B-12: 239 pg/mL (ref 211–911)

## 2013-04-19 LAB — FOLATE: Folate: 16.7 ng/mL

## 2013-04-19 LAB — RETICULOCYTES
RBC.: 2.86 MIL/uL — ABNORMAL LOW (ref 4.22–5.81)
Retic Count, Absolute: 80.1 10*3/uL (ref 19.0–186.0)
Retic Ct Pct: 2.8 % (ref 0.4–3.1)

## 2013-04-19 LAB — TSH: TSH: 6.788 u[IU]/mL — ABNORMAL HIGH (ref 0.350–4.500)

## 2013-04-19 NOTE — Consult Note (Signed)
GI Attending Note   Chart was reviewed and patient was examined. X-rays and lab were reviewed.    I agree with management and plans.  Sandy Salaam. Deatra Ina, M.D., [facility] Cell [phone]

## 2013-04-19 NOTE — Consult Note (Signed)
Chart the and patient examined.  Full note to follow.  Patient appears to have an upper GI bleed as evidence by elevated BUN, Hemoccult positive stool, recently having passed a stool  with bright red blood, and anemia.  Dyspnea on exertion and shortness of breath are reflective of worsening anemia.  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  I suspect that he has recurrent peptic disease with bleeding. Plan to proceed with upper endoscopy in a.m (he ate lunch).  Recommend twice a day PPI therapy and maintain blood counts between 8 and 9.

## 2013-04-19 NOTE — Progress Notes (Signed)
[facility] PROGRESS NOTE  [name] ZOX:[id number] DOB: October 07, 1932 DOA: 04/18/2013 PCP: Redge Gainer, MD Brief HPI: 80 year old male with a history of COPD, coronary artery disease status post stenting, chronic anemia on iron, hypothyroidism who presented to the emergency department with main concern of several days duration of progressively worsening shortness of breath, mostly exertional but now present at rest as well, associated with nausea, poor oral intake, malaise, dizziness. He denies chest pain, no fevers, chills, no abdominal or urinary concerns, no recent sicknesses/hospitalizations, no sick contacts/exposures, no focal neurological symptoms.  In ED, pt stable but found to have initial oxygen saturation in high 80's which quickle improved to mid 90's with oxygen. Hg noted to be 7.6. [facility] asked to admit to telemetry bed for further evaluation. 3 U of PRBC ordered in ED for transfusion.  Assessment/Plan: Shortness of breath on exertion:  - most likely secondary to acute blood loss anemia of unclear etiology  - proceed with transfusion and consider GI consult based on the above test results  - CBC in AM  Acute blood loss anemia  - has a h/o esophageal ruupture 4 years ago.  - s/p 3units of prbc transfusion,. - GI consulted.  - last colonoscopy by Dr. Philip Aspen in 2012 with diverticulosis and otherwise normal colonoscopy  - hold all heparin products due to high risk bleed  - place on SCD's for DVT prophylaxis  Hypotension  - secondary to acute blood loss anemia  - proceed with blood transfusion and monitor on telemetry  Leukocytosis  Probably reactive. Resolved.   Hypothyroidism  - will check TSH and continue home regimen with synthroid   Code Status: Full  Family Communication: Pt at bedside  Disposition Plan: Admit to telemetry bed     Consultants:  GI consult from [place].   Procedures:  3 units of prbc  transfusion  Antibiotics:  none  HPI/Subjective: Comfortable.  No sob.  Pain and discomfort in the scrotum   Objective: Filed Vitals:   04/19/13 0607  BP: 101/49  Pulse: 73  Temp: 98.3 F (36.8 C)  Resp: 18    Intake/Output Summary (Last 24 hours) at 04/19/13 1205 Last data filed at 04/19/13 1000  Gross per 24 hour  Intake   1450 ml  Output   1776 ml  Net   -326 ml   Filed Weights   04/18/13 1910 04/19/13 0645  Weight: 78.5 kg (173 lb 1 oz) 78.4 kg (172 lb 13.5 oz)    Exam:   General:  Alert afebrile comforatable  Cardiovascular: s1s2  Respiratory: ctab  Abdomen: soft NT nd bs+  Musculoskeletal: trace pedal edema.   Data Reviewed: Basic Metabolic Panel:  Recent Labs Lab 04/18/13 1421 04/19/13 1015  NA 137 139  K 5.0 4.0  CL 105 107  CO2 21 21  GLUCOSE 128* 97  BUN 36* 24*  CREATININE 0.82 0.88  CALCIUM 8.2* 8.2*   Liver Function Tests: No results found for this basename: AST, ALT, ALKPHOS, BILITOT, PROT, ALBUMIN,  in the last 168 hours No results found for this basename: LIPASE, AMYLASE,  in the last 168 hours No results found for this basename: AMMONIA,  in the last 168 hours CBC:  Recent Labs Lab 04/18/13 1421 04/19/13 1015  WBC 11.7* 9.7  NEUTROABS 9.5*  --   HGB 7.6* 8.7*  HCT 23.4* 26.2*  MCV 97.5 91.9  PLT 248 162   Cardiac Enzymes: No results found for this basename: CKTOTAL, CKMB, CKMBINDEX, TROPONINI,  in  the last 168 hours BNP (last 3 results)  Recent Labs  09/20/12 1500 04/18/13 1421  PROBNP 567.3* 261.9   CBG: No results found for this basename: GLUCAP,  in the last 168 hours  Recent Results (from the past 240 hour(s))  MRSA PCR SCREENING     Status: None   Collection Time    04/18/13  8:26 PM      Result Value Ref Range Status   MRSA by PCR NEGATIVE  NEGATIVE Final   Comment:            The GeneXpert MRSA Assay (FDA     approved for NASAL specimens     only), is one component of a     comprehensive MRSA  colonization     surveillance program. It is not     intended to diagnose MRSA     infection nor to guide or     monitor treatment for     MRSA infections.     Studies: Dg Chest Port 1 View  04/18/2013   CLINICAL DATA:  Shortness of breath.  Weakness.  COPD.  EXAM: PORTABLE CHEST - 1 VIEW  COMPARISON:  10/25/2012  FINDINGS: The heart size and mediastinal contours are within normal limits. Both lungs are clear. No evidence of pneumothorax or pleural effusion. Several bold left rib fracture deformities noted.  IMPRESSION: No active disease.   Electronically Signed   By: Earle Gell M.D.   On: 04/18/2013 16:04    Scheduled Meds: . bromocriptine  2.5 mg Oral QHS  . budesonide-formoterol  2 puff Inhalation BID  . ferrous sulfate  325 mg Oral Daily  . levothyroxine  50 mcg Oral QAC breakfast  . magnesium oxide  400 mg Oral Daily  . pantoprazole (PROTONIX) IV  40 mg Intravenous Q12H  . sodium chloride  3 mL Intravenous Q12H  . tamsulosin  0.4 mg Oral Daily   Continuous Infusions:   Active Problems:   Acute respiratory failure    Time spent: 35 min    [facility] Pager [phone] 7PM-7AM, please contact night-coverage at [email or web address], password [facility] 04/19/2013, 12:05 PM  LOS: 1 day

## 2013-04-19 NOTE — Consult Note (Signed)
Consultation  Referring Provider: [facility]   Primary Care Physician:  Redge Gainer, MD Primary Gastroenterologist: Verl Blalock, MD        Reason for Consultation:   Anemia, heme positive stool          HPI:   [name] is a 80 y.o. male with multiple medical problems. His GI history of pertinent for a Mallory-Weiss tear, GERD / fundoplication, upper GI bleed secondary to esophageal ulcer with visible vessel (2011), diverticulosis, and adenomatous colon polyps. Patient last seen by [place] in 2012.  [name] presented to ED yesterday with SOB / dizziness. Hgb was 7.6. Difficult to know baseline hgb as it has bounced around drastically over the last   Few years. After 3 units of blood hgb only up to 8.7 however. Stools dark on iron but he just had a BM with visible blood (first one he has seen). Patient takes 1/2 Protonix daily at home why has adequately controlled "indigestion". Over the last few weeks he has noticed mid upper abdominal discomfort despite PPI. No nausea. He takes a baby asa, no other NSAIDS.   Past Medical History  Diagnosis Date  . Emphysema   . CAD (coronary artery disease)     left main had  a 20% stenosis.  Left anterior descending had 40-50% proximal   stenosis.  Left circumflex had an 80% proximal stenosis.  The  first obtuse marginal artery had a 60% mid stenosis.  The  right  coronary artery  had  a 50-60% proximal stenosis with a 60-70%  mid stenosis  The patient was managed medically.   Marland Kitchen COPD (chronic obstructive pulmonary disease)   . Hypothyroidism   . Anemia   . Blood transfusion june 2011, 5 units given    july 2011 some units given  . Enlarged prostate     elevated psa recently  . GERD (gastroesophageal reflux disease)   . Arthritis   . Complication of anesthesia     likes spinal due to copd  . Dizziness     occasional  . Shortness of breath     with exertion  . Mass, scrotum     L scrotum - Sees Dr. Jeffie Pollock    Past Surgical  History  Procedure Laterality Date  . Coronary stent placement  5 yrs ago  . Hiatal hernia repair  01-04-2008  . Foot surgery  1994 left, 2002 right foot    bilateral foot reconstruciton  . Abdominal hernia repair   2008  . Cataract extraction both eyes    . Total hip arthroplasty  07/21/2011    Procedure: TOTAL HIP ARTHROPLASTY ANTERIOR APPROACH;  Surgeon: Mauri Pole, MD;  Location: [facility];  Service: Orthopedics;  Laterality: Left;  . Coronary angioplasty    . Total hip arthroplasty Right 09/07/2012    Procedure: RIGHT TOTAL HIP ARTHROPLASTY ANTERIOR APPROACH;  Surgeon: Mcarthur Rossetti, MD;  Location: [facility];  Service: Orthopedics;  Laterality: Right;  . Foot surgery      reconstruction of both feet    Family History  Problem Relation Age of Onset  . Heart disease Sister   . Heart failure Father   . Colon cancer Neg Hx      History  Substance Use Topics  . Smoking status: Never Smoker   . Smokeless tobacco: Never Used  . Alcohol Use: No    Prior to Admission medications   Medication Sig Start Date End Date Taking? Authorizing  Provider  albuterol (PROVENTIL HFA;VENTOLIN HFA) 108 (90 BASE) MCG/ACT inhaler Inhale 2 puffs into the lungs every 6 (six) hours as needed for wheezing or shortness of breath.   Yes Historical Provider, MD  aspirin EC 81 MG tablet Take 81 mg by mouth at bedtime.    Yes Historical Provider, MD  bromocriptine (PARLODEL) 2.5 MG tablet Take 2.5 mg by mouth at bedtime.    Yes Historical Provider, MD  budesonide-formoterol (SYMBICORT) 160-4.5 MCG/ACT inhaler Inhale 2 puffs into the lungs 2 (two) times daily.   Yes Historical Provider, MD  cholecalciferol (VITAMIN D) 1000 UNITS tablet Take 1,000 Units by mouth 2 (two) times daily.   Yes Historical Provider, MD  ferrous sulfate 325 (65 FE) MG tablet Take 325 mg by mouth daily.    Yes Historical Provider, MD  guaiFENesin (MUCINEX) 600 MG 12 hr tablet Take 600 mg by mouth 2 (two) times daily.    Yes  Historical Provider, MD  levothyroxine (SYNTHROID, LEVOTHROID) 50 MCG tablet Take 50 mcg by mouth daily before breakfast.   Yes Historical Provider, MD  magnesium oxide (MAG-OX) 400 MG tablet Take 400 mg by mouth daily.   Yes Historical Provider, MD  montelukast (SINGULAIR) 10 MG tablet Take 10 mg by mouth daily as needed (for allergies).    Yes Historical Provider, MD  nitroGLYCERIN (NITROSTAT) 0.4 MG SL tablet Place 0.4 mg under the tongue every 5 (five) minutes as needed for chest pain.   Yes Historical Provider, MD  pantoprazole (PROTONIX) 40 MG tablet Take 40 mg by mouth daily.   Yes Historical Provider, MD  tamsulosin (FLOMAX) 0.4 MG CAPS Take 1 capsule (0.4 mg total) by mouth daily. 09/11/12  Yes Kathryne Hitch, MD  tetrahydrozoline (VISINE) 0.05 % ophthalmic solution Place 1 drop into both eyes as needed (For dry eyes).   Yes Historical Provider, MD  traMADol (ULTRAM) 50 MG tablet Take 50 mg by mouth every 6 (six) hours as needed for pain.   Yes Historical Provider, MD  vitamin E (VITAMIN E) 400 UNIT capsule Take 400 Units by mouth daily.   Yes Historical Provider, MD    Current Facility-Administered Medications  Medication Dose Route Frequency Provider Last Rate Last Dose  . bromocriptine (PARLODEL) tablet 2.5 mg  2.5 mg Oral QHS Dorothea Ogle, MD   2.5 mg at 04/18/13 2141  . budesonide-formoterol (SYMBICORT) 160-4.5 MCG/ACT inhaler 2 puff  2 puff Inhalation BID Dorothea Ogle, MD   2 puff at 04/19/13 0914  . ferrous sulfate tablet 325 mg  325 mg Oral Daily Dorothea Ogle, MD   325 mg at 04/19/13 1003  . levothyroxine (SYNTHROID, LEVOTHROID) tablet 50 mcg  50 mcg Oral QAC breakfast Dorothea Ogle, MD   50 mcg at 04/19/13 1003  . magnesium oxide (MAG-OX) tablet 400 mg  400 mg Oral Daily Dorothea Ogle, MD   400 mg at 04/19/13 1003  . montelukast (SINGULAIR) tablet 10 mg  10 mg Oral Daily PRN Dorothea Ogle, MD      . naphazoline (NAPHCON) 0.1 % ophthalmic solution 1 drop  1 drop Both  Eyes QID PRN Dorothea Ogle, MD      . nitroGLYCERIN (NITROSTAT) SL tablet 0.4 mg  0.4 mg Sublingual Q5 min PRN Dorothea Ogle, MD      . ondansetron [facility]) tablet 4 mg  4 mg Oral Q6H PRN Dorothea Ogle, MD   4 mg at 04/18/13 2337   Or  .  ondansetron (ZOFRAN) injection 4 mg  4 mg Intravenous Q6H PRN Theodis Blaze, MD      . pantoprazole (PROTONIX) injection 40 mg  40 mg Intravenous Q12H Theodis Blaze, MD   40 mg at 04/19/13 1003  . sodium chloride 0.9 % injection 3 mL  3 mL Intravenous Q12H Theodis Blaze, MD      . tamsulosin [facility]) capsule 0.4 mg  0.4 mg Oral Daily Theodis Blaze, MD   0.4 mg at 04/19/13 1003  . traMADol (ULTRAM) tablet 50 mg  50 mg Oral Q6H PRN Theodis Blaze, MD        Allergies as of 04/18/2013 - Review Complete 04/18/2013  Allergen Reaction Noted  . Lortab [hydrocodone-acetaminophen] Nausea And Vomiting 07/18/2011  . Penicillins Other (See Comments)   . Zetia [ezetimibe] Other (See Comments) 06/23/2010  . Zocor [simvastatin] Nausea Only and Other (See Comments) 06/23/2010   Review of Systems:    Positive for scrotal swelling. Positive for lower extremity swelling. All other systems reviewed and negative except where noted in HPI.   Physical Exam:  Vital signs in last 24 hours: Temp:  [97.6 F (36.4 C)-98.5 F (36.9 C)] 98.3 F (36.8 C) (02/20 0607) Pulse Rate:  [73-117] 73 (02/20 0607) Resp:  [18-31] 18 (02/20 0607) BP: (84-126)/(47-67) 101/49 mmHg (02/20 0607) SpO2:  [95 %-100 %] 98 % (02/20 0914) Weight:  [172 lb 13.5 oz (78.4 kg)-173 lb 1 oz (78.5 kg)] 172 lb 13.5 oz (78.4 kg) (02/20 0645) Last BM Date: 04/18/13 General:   Pleasant white male in NAD Head:  Normocephalic and atraumatic. Eyes:   No icterus.   Conjunctiva pink. Ears:  Normal auditory acuity. Neck:  Supple; no masses felt Lungs:  Respirations even and unlabored. Lungs clear to auscultation bilaterally.   No wheezes, crackles, or rhonchi.  Heart:  Regular rate and rhythm. Abdomen:  Soft,  nondistended, nontender. Normal bowel sounds. No appreciable masses or hepatomegaly.   Msk:  Symmetrical without gross deformities.  Extremities:  Without edema. Neurologic:  Alert and  oriented x4;  grossly normal neurologically. Skin:  Intact without significant lesions or rashes. Cervical Nodes:  No significant cervical adenopathy. Psych:  Alert and cooperative. Normal affect.  LAB RESULTS:  Recent Labs  04/18/13 1421 04/19/13 1015  WBC 11.7* 9.7  HGB 7.6* 8.7*  HCT 23.4* 26.2*  PLT 248 162   BMET  Recent Labs  04/18/13 1421 04/19/13 1015  NA 137 139  K 5.0 4.0  CL 105 107  CO2 21 21  GLUCOSE 128* 97  BUN 36* 24*  CREATININE 0.82 0.88  CALCIUM 8.2* 8.2*   PT/INR  Recent Labs  04/18/13 1605  LABPROT 14.9  INR 1.20    STUDIES: Dg Chest Port 1 View  04/18/2013   CLINICAL DATA:  Shortness of breath.  Weakness.  COPD.  EXAM: PORTABLE CHEST - 1 VIEW  COMPARISON:  10/25/2012  FINDINGS: The heart size and mediastinal contours are within normal limits. Both lungs are clear. No evidence of pneumothorax or pleural effusion. Several bold left rib fracture deformities noted.  IMPRESSION: No active disease.   Electronically Signed   By: Earle Gell M.D.   On: 04/18/2013 16:04     PREVIOUS ENDOSCOPIES:            Surveillance colonoscopy Nov 2012. Findings: diverticulosis. See HPI regarding  EGDs   Impression / Plan:   78. 80 year old male with GIB / Normocytic anemia. Stools dark on  iron at home but just had BM with visible blood (first one). BUN elevated.Rule out erosive disease, PUD. For further evaluation patient will be scheduled for am EGD.  Continue BID PPI.   2. Hailey anemia. Difficult to know baseline given wide fluctuations of the years. Presenting hgb 7.6, now only 8.7 post 3 units of blood. Continue to monitor hgb, transfuse additional blood for hgb less than 8.   3. Hx of adenomatous colon polyps, up to date on screening.   4. Scrotal swelling, going to  ultrasound today  Thanks   LOS: 1 day   Tye Savoy  04/19/2013, 1:32 PM

## 2013-04-20 ENCOUNTER — Encounter (HOSPITAL_COMMUNITY)
Admission: EM | Disposition: A | Discharge status: Home / Self Care | Payer: Self-pay | Source: Home / Self Care | Attending: Internal Medicine

## 2013-04-20 ENCOUNTER — Encounter (HOSPITAL_COMMUNITY): Payer: Self-pay | Admitting: *Deleted

## 2013-04-20 DIAGNOSIS — K2211 Ulcer of esophagus with bleeding: Principal | ICD-10-CM

## 2013-04-20 DIAGNOSIS — D649 Anemia, unspecified: Secondary | ICD-10-CM

## 2013-04-20 DIAGNOSIS — Z8719 Personal history of other diseases of the digestive system: Secondary | ICD-10-CM | POA: Diagnosis present

## 2013-04-20 HISTORY — PX: ESOPHAGOGASTRODUODENOSCOPY: SHX5428

## 2013-04-20 LAB — TYPE AND SCREEN
ABO/RH(D): O POS
Antibody Screen: POSITIVE
DAT, IgG: NEGATIVE
Donor AG Type: NEGATIVE
Donor AG Type: NEGATIVE
Donor AG Type: NEGATIVE
Unit division: 0
Unit division: 0
Unit division: 0

## 2013-04-20 LAB — HEMOGLOBIN AND HEMATOCRIT, BLOOD
HCT: 25.9 % — ABNORMAL LOW (ref 39.0–52.0)
Hemoglobin: 8.8 g/dL — ABNORMAL LOW (ref 13.0–17.0)

## 2013-04-20 SURGERY — EGD (ESOPHAGOGASTRODUODENOSCOPY)
Anesthesia: Moderate Sedation

## 2013-04-20 MED ORDER — DIPHENHYDRAMINE HCL 50 MG/ML IJ SOLN
INTRAMUSCULAR | Status: AC
  Filled 2013-04-20: qty 1

## 2013-04-20 MED ORDER — MIDAZOLAM HCL 10 MG/2ML IJ SOLN
INTRAMUSCULAR | Status: DC | PRN
  Administered 2013-04-20 (×2): 2 mg via INTRAVENOUS

## 2013-04-20 MED ORDER — BUTAMBEN-TETRACAINE-BENZOCAINE 2-2-14 % EX AERO
INHALATION_SPRAY | CUTANEOUS | Status: DC | PRN
  Administered 2013-04-20: 2 via TOPICAL

## 2013-04-20 MED ORDER — SODIUM CHLORIDE 0.9 % IV SOLN
INTRAVENOUS | Status: DC
  Administered 2013-04-20: 500 mL via INTRAVENOUS

## 2013-04-20 MED ORDER — FENTANYL CITRATE 0.05 MG/ML IJ SOLN
INTRAMUSCULAR | Status: AC
  Filled 2013-04-20: qty 2

## 2013-04-20 MED ORDER — FENTANYL CITRATE 0.05 MG/ML IJ SOLN
INTRAMUSCULAR | Status: DC | PRN
  Administered 2013-04-20 (×2): 25 ug via INTRAVENOUS

## 2013-04-20 MED ORDER — MIDAZOLAM HCL 10 MG/2ML IJ SOLN
INTRAMUSCULAR | Status: AC
  Filled 2013-04-20: qty 2

## 2013-04-20 NOTE — Progress Notes (Signed)
PT NOTE  Attempted PT eval. Pt out of room for procedure. Will attempt to check back later if schedule permits. Thanks. Weston Anna, PT [phone]

## 2013-04-20 NOTE — Op Note (Signed)
[facility] [place], [address]   ENDOSCOPY PROCEDURE REPORT  PATIENT: [name], [name]  MR#: [id number] BIRTHDATE: [birth year] , 80  yrs. old GENDER: Male ENDOSCOPIST: Inda Castle, MD REFERRED BY:  Redge Gainer, M.D. PROCEDURE DATE:  04/20/2013 PROCEDURE:  EGD, diagnostic ASA CLASS:     Class III INDICATIONS: MEDICATIONS: These medications were titrated to patient response per physician's verbal order, Versed 4 mg IV, and Fentanyl 50 mcg IV TOPICAL ANESTHETIC: Cetacaine Spray  DESCRIPTION OF PROCEDURE: After the risks benefits and alternatives of the procedure were thoroughly explained, informed consent was obtained.  The Pentax Gastroscope [id number] endoscope was introduced through the mouth and advanced to the third portion of the duodenum. Without limitations.  The instrument was slowly withdrawn as the mucosa was fully examined.      At the GE junction there was a large clean-based ulcer that was partially circumferential.  There was slight oozing at the fringes of the ulcer.  Surrounding mucosa was erythematous but otherwise normal. A 3 cm sliding hiatal hernia was present.   The remainder of the upper endoscopy exam was otherwise normal.  Retroflexed views revealed no abnormalities.     The scope was then withdrawn from the patient and the procedure completed.  COMPLICATIONS: There were no complications. ENDOSCOPIC IMPRESSION: 1.   acute esophageal ulcer  RECOMMENDATIONS: twice a day PPI therapy for 2 weeks and then daily REPEAT EXAM:  eSigned:  Inda Castle, MD 04/20/2013 9:46 AM   CC:

## 2013-04-20 NOTE — H&P (View-Only) (Signed)
Chart the and patient examined.  Full note to follow.  Patient appears to have an upper GI bleed as evidence by elevated BUN, Hemoccult positive stool, recently having passed a stool  with bright red blood, and anemia.  Dyspnea on exertion and shortness of breath are reflective of worsening anemia.  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  I suspect that he has recurrent peptic disease with bleeding. Plan to proceed with upper endoscopy in a.m (he ate lunch).  Recommend twice a day PPI therapy and maintain blood counts between 8 and 9. 

## 2013-04-20 NOTE — Progress Notes (Signed)
Upper endoscopy demonstrated a large ulcer at the GE junction. Patient will require maintenance PPI therapy, full-strength, after 2 weeks of twice a day Protonix.  He should avoid NSAIDs.

## 2013-04-20 NOTE — Interval H&P Note (Signed)
History and Physical Interval Note:  04/20/2013 9:24 AM  [name]  has presented today for surgery, with the diagnosis of GI bleed  The various methods of treatment have been discussed with the patient and family. After consideration of risks, benefits and other options for treatment, the patient has consented to  Procedure(s): ESOPHAGOGASTRODUODENOSCOPY (EGD) (N/A) as a surgical intervention .  The patient's history has been reviewed, patient examined, no change in status, stable for surgery.  I have reviewed the patient's chart and labs.  Questions were answered to the patient's satisfaction.    The recent H&P (dated *04/19/13**) was reviewed, the patient was examined and there is no change in the patients condition since that H&P was completed.   Erskine Emery  04/20/2013, 9:24 AM    Erskine Emery

## 2013-04-20 NOTE — Progress Notes (Signed)
[facility] PROGRESS NOTE  [name] STM:[id number] DOB: Sep 02, 1932 DOA: 04/18/2013 PCP: Redge Gainer, MD Brief HPI: 80 year old male with a history of COPD, coronary artery disease status post stenting, chronic anemia on iron, hypothyroidism who presented to the emergency department with main concern of several days duration of progressively worsening shortness of breath, mostly exertional but now present at rest as well, associated with nausea, poor oral intake, malaise, dizziness. He denies chest pain, no fevers, chills, no abdominal or urinary concerns, no recent sicknesses/hospitalizations, no sick contacts/exposures, no focal neurological symptoms.  In ED, pt stable but found to have initial oxygen saturation in high 80's which quickle improved to mid 90's with oxygen. Hg noted to be 7.6. [facility] asked to admit to telemetry bed for further evaluation. 3 U of PRBC ordered in ED for transfusion.  Assessment/Plan: Shortness of breath on exertion:  - most likely secondary to acute blood loss anemia. - CBC in AM  Acute blood loss anemia  - has a h/o esophageal ruupture 4 years ago.  - s/p 3units of prbc transfusion,. - GI consulted. And he underwent EGD on 2/21, he was found to have ulcer,recommended sPPI Twice a day for 2 weeks followed by one week.  - last colonoscopy by Dr. Philip Aspen in 2012 with diverticulosis and otherwise normal colonoscopy  - hold all heparin products due to high risk bleed  - place on SCD's for DVT prophylaxis  Hypotension  - secondary to acute blood loss anemia  - proceed with blood transfusion and monitor on telemetry  Leukocytosis  Probably reactive. Resolved.   Hypothyroidism  - will check TSH and continue home regimen with synthroid   Code Status: Full  Family Communication: Pt at bedside  Disposition Plan: Admit to telemetry bed     Consultants:  GI consult from [facility].   Procedures:  3 units of prbc  transfusion  Antibiotics:  none  HPI/Subjective: Comfortable.  No sob.  Pain and discomfort in the scrotum  US SCROTUM ORDERED.   Objective: Filed Vitals:   04/20/13 1254  BP: 121/64  Pulse: 63  Temp: 97.3 F (36.3 C)  Resp: 20    Intake/Output Summary (Last 24 hours) at 04/20/13 1910 Last data filed at 04/20/13 1020  Gross per 24 hour  Intake    100 ml  Output    900 ml  Net   -800 ml   Filed Weights   04/18/13 1910 04/19/13 0645 04/20/13 0520  Weight: 78.5 kg (173 lb 1 oz) 78.4 kg (172 lb 13.5 oz) 75.025 kg (165 lb 6.4 oz)    Exam:   General:  Alert afebrile comforatable  Cardiovascular: s1s2  Respiratory: ctab  Abdomen: soft NT nd bs+  Musculoskeletal: trace pedal edema.   Data Reviewed: Basic Metabolic Panel:  Recent Labs Lab 04/18/13 1421 04/19/13 1015  NA 137 139  K 5.0 4.0  CL 105 107  CO2 21 21  GLUCOSE 128* 97  BUN 36* 24*  CREATININE 0.82 0.88  CALCIUM 8.2* 8.2*   Liver Function Tests: No results found for this basename: AST, ALT, ALKPHOS, BILITOT, PROT, ALBUMIN,  in the last 168 hours No results found for this basename: LIPASE, AMYLASE,  in the last 168 hours No results found for this basename: AMMONIA,  in the last 168 hours CBC:  Recent Labs Lab 04/18/13 1421 04/19/13 1015 04/20/13 1117  WBC 11.7* 9.7  --   NEUTROABS 9.5*  --   --   HGB 7.6* 8.7* 8.8*  HCT 23.4* 26.2* 25.9*  MCV 97.5 91.9  --   PLT 248 162  --    Cardiac Enzymes: No results found for this basename: CKTOTAL, CKMB, CKMBINDEX, TROPONINI,  in the last 168 hours BNP (last 3 results)  Recent Labs  09/20/12 1500 04/18/13 1421  PROBNP 567.3* 261.9   CBG: No results found for this basename: GLUCAP,  in the last 168 hours  Recent Results (from the past 240 hour(s))  MRSA PCR SCREENING     Status: None   Collection Time    04/18/13  8:26 PM      Result Value Ref Range Status   MRSA by PCR NEGATIVE  NEGATIVE Final   Comment:            The GeneXpert  MRSA Assay (FDA     approved for NASAL specimens     only), is one component of a     comprehensive MRSA colonization     surveillance program. It is not     intended to diagnose MRSA     infection nor to guide or     monitor treatment for     MRSA infections.     Studies: US Scrotum  04/19/2013   CLINICAL DATA:  Possible scrotal mass.  EXAM: ULTRASOUND OF SCROTUM  TECHNIQUE: Complete ultrasound examination of the testicles, epididymis, and other scrotal structures was performed.  COMPARISON:  None.  FINDINGS: Right testicle  Measurements: Approximately 4.0 x 2.7 x 3.0 cm. Normal parenchymal echotexture without mass or microlithiasis. Normal color Doppler flow without evidence of hyperemia.  Left testicle  Measurements: Approximately 4.3 x 1.7 x 2.8 cm. Normal parenchymal echotexture without mass or microlithiasis. Normal color Doppler flow without evidence of hyperemia.  Right epididymis: Normal in size containing an approximate 0.9 x 1.2 x 1.0 cm cyst or spermatocele. No evidence of hyperemia on color Doppler evaluation.  Left epididymis: Approximate 2.0 x 1.4 x 1.3 cm hypoechoic mass with internal color Doppler flow adjacent to or arising from the right epididymal head. No evidence of hyperemia involving the epididymis on color Doppler evaluation.  Hydrocele:  Small bilateral hydroceles.  Varicocele:  Left varicocele.  IMPRESSION: 1. Approximate 2 cm mass arising from or adjacent to the right epididymal head. Differential diagnosis might include a fibrous pseudotumor related to prior inflammatory disease, an adenomatoid tumor, or a granulomatous mass which can be seen in patients with sarcoidosis. Urology consultation may be helpful. 2. Normal-appearing testicles. 3. Approximate 1 cm cyst or spermatocele in the right epididymal head. 4. Left varicocele.  Small bilateral hydroceles.   Electronically Signed   By: Evangeline Dakin M.D.   On: 04/19/2013 15:06    Scheduled Meds: . bromocriptine  2.5  mg Oral QHS  . budesonide-formoterol  2 puff Inhalation BID  . ferrous sulfate  325 mg Oral Daily  . levothyroxine  50 mcg Oral QAC breakfast  . magnesium oxide  400 mg Oral Daily  . pantoprazole (PROTONIX) IV  40 mg Intravenous Q12H  . sodium chloride  3 mL Intravenous Q12H  . tamsulosin  0.4 mg Oral Daily   Continuous Infusions:   Active Problems:   Acute respiratory failure   Esophageal ulcer with bleeding    Time spent: 35 min    [facility] Pager [phone] 7PM-7AM, please contact night-coverage at [email or web address], password [facility] 04/20/2013, 7:10 PM  LOS: 2 days

## 2013-04-20 NOTE — Evaluation (Signed)
Physical Therapy Evaluation Patient Details Name: [name] MRN: [id number] DOB: 09-Aug-1932 Today's Date: 04/20/2013 Time: 5277-8242 PT Time Calculation (min): 16 min  PT Assessment / Plan / Recommendation History of Present Illness  80 yo male admitted with UGIB, acute resp failure, scrotal swelling. Hx of bil THA   Clinical Impression  On eval, pt required supervision level assist for mobility-able to ambulate ~150 feet with walker. No LOB. Tolerated activity well. No follow up PT needs at this time.     PT Assessment  Patient needs continued PT services    Follow Up Recommendations  No PT follow up    Does the patient have the potential to tolerate intense rehabilitation      Barriers to Discharge        Equipment Recommendations  None recommended by PT    Recommendations for Other Services     Frequency Min 3X/week    Precautions / Restrictions Precautions Precautions: None Restrictions Weight Bearing Restrictions: No   Pertinent Vitals/Pain No c/o pain      Mobility  Bed Mobility Overal bed mobility: Modified Independent Transfers Overall transfer level: Modified independent Ambulation/Gait Ambulation/Gait assistance: Supervision Ambulation Distance (Feet): 150 Feet Assistive device: Rolling walker (2 wheeled) Gait Pattern/deviations: Decreased step length - left;Decreased step length - right;Trunk flexed;Decreased stride length General Gait Details: slow gait speed. No LOB.     Exercises     PT Diagnosis: Generalized weakness  PT Problem List: Decreased mobility PT Treatment Interventions: DME instruction;Gait training;Functional mobility training;Therapeutic exercise;Therapeutic activities;Patient/family education     PT Goals(Current goals can be found in the care plan section) Acute Rehab PT Goals Patient Stated Goal: home soon PT Goal Formulation: With patient/family Time For Goal Achievement: 05/04/13 Potential to Achieve Goals:  Good  Visit Information  Last PT Received On: 04/20/13 Assistance Needed: +1 History of Present Illness: 80 yo male admitted with UGIB, acute resp failure, scrotal swelling. Hx of bil THA        Prior Functioning  Home Living Family/patient expects to be discharged to:: Private residence Living Arrangements: Spouse/significant other Available Help at Discharge: Family Type of Home: House Home Access: [place]: One [place]: Environmental consultant - 2 wheels;Cane - single point;Grab bars - tub/shower;Adaptive equipment Additional Comments: Pt informs OT that he assists wife with IADLs, and needs to be as independent at discharge.  However, he has 3 grandsons that will assist at needed Prior Function Level of Independence: Independent with assistive device(s) Comments: Pt with hx of falls - the last was 3 wks ago, and 2 not long before that Communication Communication: No difficulties    Cognition  Cognition Arousal/Alertness: Awake/alert Behavior During Therapy: WFL for tasks assessed/performed Overall Cognitive Status: Within Functional Limits for tasks assessed    Extremity/Trunk Assessment Upper Extremity Assessment Upper Extremity Assessment: Overall WFL for tasks assessed Lower Extremity Assessment Lower Extremity Assessment: Generalized weakness   Balance    End of Session PT - End of Session Equipment Utilized During Treatment: Gait belt Activity Tolerance: Patient tolerated treatment well Patient left: in bed;with call bell/phone within reach;with bed alarm set;with family/visitor present  GP     Weston Anna, MPT Pager: [phone]

## 2013-04-21 LAB — CBC
HCT: 24.9 % — ABNORMAL LOW (ref 39.0–52.0)
Hemoglobin: 8.2 g/dL — ABNORMAL LOW (ref 13.0–17.0)
MCH: 30.8 pg (ref 26.0–34.0)
MCHC: 32.9 g/dL (ref 30.0–36.0)
MCV: 93.6 fL (ref 78.0–100.0)
Platelets: 158 10*3/uL (ref 150–400)
RBC: 2.66 MIL/uL — ABNORMAL LOW (ref 4.22–5.81)
RDW: 17.3 % — ABNORMAL HIGH (ref 11.5–15.5)
WBC: 7.2 10*3/uL (ref 4.0–10.5)

## 2013-04-21 LAB — BASIC METABOLIC PANEL
BUN: 11 mg/dL (ref 6–23)
CO2: 23 mEq/L (ref 19–32)
Calcium: 8.1 mg/dL — ABNORMAL LOW (ref 8.4–10.5)
Chloride: 107 mEq/L (ref 96–112)
Creatinine, Ser: 0.87 mg/dL (ref 0.50–1.35)
GFR calc Af Amer: >90 mL/min (ref >90)
GFR calc non Af Amer: 81 mL/min — ABNORMAL LOW (ref >90)
Glucose, Bld: 90 mg/dL (ref 70–99)
Potassium: 3.9 mEq/L (ref 3.7–5.3)
Sodium: 141 mEq/L (ref 137–147)

## 2013-04-21 LAB — T4, FREE: Free T4: 0.88 ng/dL (ref 0.80–1.80)

## 2013-04-21 NOTE — Progress Notes (Signed)
Progress Note   Subjective  *Passed a blood-tinged stool yesterday.*So far no stools today.*   Objective  Vital signs in last 24 hours: Temp:  [97.3 F (36.3 C)-97.8 F (36.6 C)] 97.8 F (36.6 C) (02/22 0349) Pulse Rate:  [63-79] 67 (02/22 0349) Resp:  [18-20] 20 (02/22 0349) BP: (100-121)/(48-65) 101/48 mmHg (02/22 0349) SpO2:  [96 %-100 %] 98 % (02/22 0349) Weight:  [164 lb 3.9 oz (74.5 kg)] 164 lb 3.9 oz (74.5 kg) (02/22 0349) Last BM Date: 04/20/13 General:   Alert,  Well-developed,  white male in NAD Heart:  Regular rate and rhythm; no murmurs Abdomen:  Soft, nontender and nondistended. Normal bowel sounds, without guarding, and without rebound.   Extremities:  Without edema. Neurologic:  Alert and  oriented x4;  grossly normal neurologically. Psych:  Alert and cooperative. Normal mood and affect.  Intake/Output from previous day: 02/21 0701 - 02/22 0700 In: 100 [I.V.:100] Out: 250 [Urine:250] Intake/Output this shift: Total I/O In: 200 [P.O.:200] Out: 250 [Urine:250]  Lab Results:  Recent Labs  04/18/13 1421 04/19/13 1015 04/20/13 1117 04/21/13 0406  WBC 11.7* 9.7  --  7.2  HGB 7.6* 8.7* 8.8* 8.2*  HCT 23.4* 26.2* 25.9* 24.9*  PLT 248 162  --  158   BMET  Recent Labs  04/18/13 1421 04/19/13 1015 04/21/13 0406  NA 137 139 141  K 5.0 4.0 3.9  CL 105 107 107  CO2 21 21 23   GLUCOSE 128* 97 90  BUN 36* 24* 11  CREATININE 0.82 0.88 0.87  CALCIUM 8.2* 8.2* 8.1*   LFT No results found for this basename: PROT, ALBUMIN, AST, ALT, ALKPHOS, BILITOT, BILIDIR, IBILI,  in the last 72 hours PT/INR  Recent Labs  04/18/13 1605  LABPROT 14.9  INR 1.20   Hepatitis Panel No results found for this basename: HEPBSAG, HCVAB, HEPAIGM, HEPBIGM,  in the last 72 hours  Studies/Results: US Scrotum  04/19/2013   CLINICAL DATA:  Possible scrotal mass.  EXAM: ULTRASOUND OF SCROTUM  TECHNIQUE: Complete ultrasound examination of the testicles, epididymis,  and other scrotal structures was performed.  COMPARISON:  None.  FINDINGS: Right testicle  Measurements: Approximately 4.0 x 2.7 x 3.0 cm. Normal parenchymal echotexture without mass or microlithiasis. Normal color Doppler flow without evidence of hyperemia.  Left testicle  Measurements: Approximately 4.3 x 1.7 x 2.8 cm. Normal parenchymal echotexture without mass or microlithiasis. Normal color Doppler flow without evidence of hyperemia.  Right epididymis: Normal in size containing an approximate 0.9 x 1.2 x 1.0 cm cyst or spermatocele. No evidence of hyperemia on color Doppler evaluation.  Left epididymis: Approximate 2.0 x 1.4 x 1.3 cm hypoechoic mass with internal color Doppler flow adjacent to or arising from the right epididymal head. No evidence of hyperemia involving the epididymis on color Doppler evaluation.  Hydrocele:  Small bilateral hydroceles.  Varicocele:  Left varicocele.  IMPRESSION: 1. Approximate 2 cm mass arising from or adjacent to the right epididymal head. Differential diagnosis might include a fibrous pseudotumor related to prior inflammatory disease, an adenomatoid tumor, or a granulomatous mass which can be seen in patients with sarcoidosis. Urology consultation may be helpful. 2. Normal-appearing testicles. 3. Approximate 1 cm cyst or spermatocele in the right epididymal head. 4. Left varicocele.  Small bilateral hydroceles.   Electronically Signed   By: Evangeline Dakin M.D.   On: 04/19/2013 15:06      Assessment & Plan  1.  acute upper GI bleeding secondary to  esophageal ulcer.  Although he sees small amounts of reddish blood with stools I Akshith't think he is actively bleeding.  Plan to continue high-dose PPI therapy.  2.  anemia. Difficult to know baseline given wide fluctuations of the years. Presenting hgb 7.6, now only 8.7 post 3 units of blood. Continue to monitor hgb, transfuse additional blood for hgb less than 8.  3. Hx of adenomatous colon polyps, up to date on  screening.  ** Active Problems:   HYPOTHYROIDISM   ANEMIA   GI BLEED   Acute respiratory failure   Esophageal ulcer with bleeding     LOS: 3 days   Erskine Emery  04/21/2013, 10:25 AM

## 2013-04-21 NOTE — Progress Notes (Signed)
[facility] PROGRESS NOTE  [name] PJA:[id number] DOB: Jul 28, 1932 DOA: 04/18/2013 PCP: Redge Gainer, MD Brief HPI: 80 year old male with a history of COPD, coronary artery disease status post stenting, chronic anemia on iron, hypothyroidism who presented to the emergency department with main concern of several days duration of progressively worsening shortness of breath, mostly exertional but now present at rest as well, associated with nausea, poor oral intake, malaise, dizziness. He denies chest pain, no fevers, chills, no abdominal or urinary concerns, no recent sicknesses/hospitalizations, no sick contacts/exposures, no focal neurological symptoms.  In ED, pt stable but found to have initial oxygen saturation in high 80's which quickle improved to mid 90's with oxygen. Hg noted to be 7.6. [facility] asked to admit to telemetry bed for further evaluation. 3 U of PRBC ordered in ED for transfusion.  Assessment/Plan: Shortness of breath on exertion:  - most likely secondary to acute blood loss anemia. - CBC in AM  Acute blood loss anemia  - has a h/o esophageal ruupture 4 years ago.  - s/p 3units of prbc transfusion,. - GI consulted. And he underwent EGD on 2/21, he was found to have ulcer,recommended sPPI Twice a day for 2 weeks followed by one week.  - last colonoscopy by Dr. Philip Aspen in 2012 with diverticulosis and otherwise normal colonoscopy  - hold all heparin products due to high risk bleed  - place on SCD's for DVT prophylaxis  Hypotension  - secondary to acute blood loss anemia  - resolved with blood transfusions.  Leukocytosis  Probably reactive. Resolved.   Hypothyroidism  -  continue home regimen with synthroid   Code Status: Full  Family Communication: Pt at bedside  Disposition Plan: Admit to telemetry bed     Consultants:  GI consult from [place].   Procedures:  3 units of prbc transfusion  Antibiotics:  none  HPI/Subjective: Comfortable.  No  sob.  Pain and discomfort in the scrotum  US SCROTUM ORDERED.   Objective: Filed Vitals:   04/21/13 1345  BP: 103/54  Pulse: 97  Temp: 97.4 F (36.3 C)  Resp: 18    Intake/Output Summary (Last 24 hours) at 04/21/13 1612 Last data filed at 04/21/13 0813  Gross per 24 hour  Intake    200 ml  Output    500 ml  Net   -300 ml   Filed Weights   04/19/13 0645 04/20/13 0520 04/21/13 0349  Weight: 78.4 kg (172 lb 13.5 oz) 75.025 kg (165 lb 6.4 oz) 74.5 kg (164 lb 3.9 oz)    Exam:   General:  Alert afebrile comforatable  Cardiovascular: s1s2  Respiratory: ctab  Abdomen: soft NT nd bs+  Musculoskeletal: trace pedal edema.   Data Reviewed: Basic Metabolic Panel:  Recent Labs Lab 04/18/13 1421 04/19/13 1015 04/21/13 0406  NA 137 139 141  K 5.0 4.0 3.9  CL 105 107 107  CO2 21 21 23   GLUCOSE 128* 97 90  BUN 36* 24* 11  CREATININE 0.82 0.88 0.87  CALCIUM 8.2* 8.2* 8.1*   Liver Function Tests: No results found for this basename: AST, ALT, ALKPHOS, BILITOT, PROT, ALBUMIN,  in the last 168 hours No results found for this basename: LIPASE, AMYLASE,  in the last 168 hours No results found for this basename: AMMONIA,  in the last 168 hours CBC:  Recent Labs Lab 04/18/13 1421 04/19/13 1015 04/20/13 1117 04/21/13 0406  WBC 11.7* 9.7  --  7.2  NEUTROABS 9.5*  --   --   --  HGB 7.6* 8.7* 8.8* 8.2*  HCT 23.4* 26.2* 25.9* 24.9*  MCV 97.5 91.9  --  93.6  PLT 248 162  --  158   Cardiac Enzymes: No results found for this basename: CKTOTAL, CKMB, CKMBINDEX, TROPONINI,  in the last 168 hours BNP (last 3 results)  Recent Labs  09/20/12 1500 04/18/13 1421  PROBNP 567.3* 261.9   CBG: No results found for this basename: GLUCAP,  in the last 168 hours  Recent Results (from the past 240 hour(s))  MRSA PCR SCREENING     Status: None   Collection Time    04/18/13  8:26 PM      Result Value Ref Range Status   MRSA by PCR NEGATIVE  NEGATIVE Final   Comment:             The GeneXpert MRSA Assay (FDA     approved for NASAL specimens     only), is one component of a     comprehensive MRSA colonization     surveillance program. It is not     intended to diagnose MRSA     infection nor to guide or     monitor treatment for     MRSA infections.     Studies: No results found.  Scheduled Meds: . bromocriptine  2.5 mg Oral QHS  . budesonide-formoterol  2 puff Inhalation BID  . ferrous sulfate  325 mg Oral Daily  . levothyroxine  50 mcg Oral QAC breakfast  . magnesium oxide  400 mg Oral Daily  . pantoprazole (PROTONIX) IV  40 mg Intravenous Q12H  . sodium chloride  3 mL Intravenous Q12H  . tamsulosin  0.4 mg Oral Daily   Continuous Infusions:   Active Problems:   HYPOTHYROIDISM   ANEMIA   GI BLEED   Acute respiratory failure   Esophageal ulcer with bleeding    Time spent: 35 min    [facility] Pager [phone] 7PM-7AM, please contact night-coverage at [email or web address], password [facility] 04/21/2013, 4:12 PM  LOS: 3 days

## 2013-04-22 ENCOUNTER — Encounter (HOSPITAL_COMMUNITY): Payer: Self-pay | Admitting: Gastroenterology

## 2013-04-22 ENCOUNTER — Ambulatory Visit: Payer: Medicare Other | Admitting: Family Medicine

## 2013-04-22 DIAGNOSIS — K921 Melena: Secondary | ICD-10-CM

## 2013-04-22 LAB — CBC
HCT: 25 % — ABNORMAL LOW (ref 39.0–52.0)
Hemoglobin: 8.1 g/dL — ABNORMAL LOW (ref 13.0–17.0)
MCH: 30.6 pg (ref 26.0–34.0)
MCHC: 32.4 g/dL (ref 30.0–36.0)
MCV: 94.3 fL (ref 78.0–100.0)
Platelets: 180 10*3/uL (ref 150–400)
RBC: 2.65 MIL/uL — ABNORMAL LOW (ref 4.22–5.81)
RDW: 17.1 % — ABNORMAL HIGH (ref 11.5–15.5)
WBC: 7.8 10*3/uL (ref 4.0–10.5)

## 2013-04-22 LAB — PREPARE RBC (CROSSMATCH)

## 2013-04-22 MED ORDER — POLYETHYLENE GLYCOL 3350 17 G PO PACK: 17.0000 g | PACK | Freq: Every day | ORAL | Status: DC

## 2013-04-22 MED ORDER — PANTOPRAZOLE SODIUM 40 MG PO TBEC: 40.0000 mg | DELAYED_RELEASE_TABLET | Freq: Two times a day (BID) | ORAL | Status: DC

## 2013-04-22 MED ORDER — PANTOPRAZOLE SODIUM 40 MG PO TBEC: 40.0000 mg | DELAYED_RELEASE_TABLET | Freq: Every day | ORAL | Status: DC

## 2013-04-22 MED ORDER — POLYETHYLENE GLYCOL 3350 17 G PO PACK
17.0000 g | PACK | Freq: Every day | ORAL | Status: DC
  Administered 2013-04-22 – 2013-04-23 (×2): 17 g via ORAL
  Filled 2013-04-22 (×2): qty 1

## 2013-04-22 NOTE — Discharge Summary (Addendum)
Physician Discharge Summary  [name] [id number] DOB: 21-Mar-1932 DOA: 04/18/2013  PCP: Redge Gainer, MD  Admit date: 04/18/2013 Discharge date: 04/22/2013  Time spent: 30 minutes  Recommendations for Outpatient Follow-up:  1. Follow up with gi as recommeded 2. Follow up with urology tomorrow as scheduled 3. follo wup with PCP in one week.   Discharge Diagnoses:  Active Problems:   HYPOTHYROIDISM   ANEMIA   GI BLEED   Acute respiratory failure   Esophageal ulcer with bleeding   Discharge Condition: improved.   Diet recommendation: regular  Filed Weights   04/20/13 0520 04/21/13 0349 04/22/13 0629  Weight: 75.025 kg (165 lb 6.4 oz) 74.5 kg (164 lb 3.9 oz) 74.7 kg (164 lb 10.9 oz)    History of present illness:  80 year old male with a history of COPD, coronary artery disease status post stenting, chronic anemia on iron, hypothyroidism who presented to the emergency department with main concern of several days duration of progressively worsening shortness of breath, mostly exertional but now present at rest as well, associated with nausea, poor oral intake, malaise, dizziness. He denies chest pain, no fevers, chills, no abdominal or urinary concerns, no recent sicknesses/hospitalizations, no sick contacts/exposures, no focal neurological symptoms.  In ED, pt stable but found to have initial oxygen saturation in high 80's which quickle improved to mid 90's with oxygen. Hg noted to be 7.6. TRH asked to admit to telemetry bed for further evaluation. 3 U of PRBC ordered in ED for transfusion. Gi consulted and he underwent EGD was found to have ulcer and was started on PPI.    Hospital Course:  Shortness of breath on exertion:  - most likely secondary to acute blood loss anemia.  Resolved with blood transfusion.  Acute blood loss anemia  - has a h/o esophageal ruupture 4 years ago.  - s/p 3units of prbc transfusion,.  - GI consulted. And he underwent EGD on 2/21, he was  found to have ulcer,recommended sPPI Twice a day for 6 weeks followed by one week.  - last colonoscopy by Dr. Philip Aspen in 2012 with diverticulosis and otherwise normal colonoscopy  - outpatient follow up with GI as recommended.  Hypotension  - secondary to acute blood loss anemia  - resolved with blood transfusions.  Leukocytosis  Probably reactive. Resolved.  Hypothyroidism  - continue home regimen with synthroid   Scrotal discomfort: US of the scrotum ordered and an outpatient appointment for tomorrow scheduled with Dr Jeffie Pollock.    Consultants:  GI consult from [place].  Procedures:  3 units of prbc transfusion   Discharge Exam: Filed Vitals:   04/22/13 0629  BP: 105/59  Pulse: 73  Temp: 98.2 F (36.8 C)  Resp: 18     General: Alert afebrile comforatable Cardiovascular: s1s2 Respiratory: ctab Abdomen: soft NT nd bs+ Musculoskeletal: trace pedal edema.   Discharge Instructions     Medication List    STOP taking these medications       aspirin EC 81 MG tablet      TAKE these medications       albuterol 108 (90 BASE) MCG/ACT inhaler  Commonly known as:  PROVENTIL HFA;VENTOLIN HFA  Inhale 2 puffs into the lungs every 6 (six) hours as needed for wheezing or shortness of breath.     bromocriptine 2.5 MG tablet  Commonly known as:  PARLODEL  Take 2.5 mg by mouth at bedtime.     budesonide-formoterol 160-4.5 MCG/ACT inhaler  Commonly known as:  SYMBICORT  Inhale  2 puffs into the lungs 2 (two) times daily.     cholecalciferol 1000 UNITS tablet  Commonly known as:  VITAMIN D  Take 1,000 Units by mouth 2 (two) times daily.     ferrous sulfate 325 (65 FE) MG tablet  Take 325 mg by mouth daily.     guaiFENesin 600 MG 12 hr tablet  Commonly known as:  MUCINEX  Take 600 mg by mouth 2 (two) times daily.     levothyroxine 50 MCG tablet  Commonly known as:  SYNTHROID, LEVOTHROID  Take 50 mcg by mouth daily before breakfast.     magnesium oxide 400 MG tablet   Commonly known as:  MAG-OX  Take 400 mg by mouth daily.     montelukast 10 MG tablet  Commonly known as:  SINGULAIR  Take 10 mg by mouth daily as needed (for allergies).     nitroGLYCERIN 0.4 MG SL tablet  Commonly known as:  NITROSTAT  Place 0.4 mg under the tongue every 5 (five) minutes as needed for chest pain.     pantoprazole 40 MG tablet  Commonly known as:  PROTONIX  Take 1 tablet (40 mg total) by mouth 2 (two) times daily.     pantoprazole 40 MG tablet  Commonly known as:  PROTONIX  Take 1 tablet (40 mg total) by mouth daily.  Start taking on:  05/07/2013     tamsulosin 0.4 MG Caps capsule  Commonly known as:  FLOMAX  Take 1 capsule (0.4 mg total) by mouth daily.     traMADol 50 MG tablet  Commonly known as:  ULTRAM  Take 50 mg by mouth every 6 (six) hours as needed for pain.     VISINE 0.05 % ophthalmic solution  Generic drug:  tetrahydrozoline  Place 1 drop into both eyes as needed (For dry eyes).     vitamin E 400 UNIT capsule  Generic drug:  vitamin E  Take 400 Units by mouth daily.       Allergies  Allergen Reactions  . Lortab [Hydrocodone-Acetaminophen] Nausea And Vomiting  . Penicillins Other (See Comments)    Light headed  . Zetia [Ezetimibe] Other (See Comments)    Muscle weakness  . Zocor [Simvastatin] Nausea Only and Other (See Comments)    muscle weakness      The results of significant diagnostics from this hospitalization (including imaging, microbiology, ancillary and laboratory) are listed below for reference.    Significant Diagnostic Studies: US Scrotum  04/19/2013   CLINICAL DATA:  Possible scrotal mass.  EXAM: ULTRASOUND OF SCROTUM  TECHNIQUE: Complete ultrasound examination of the testicles, epididymis, and other scrotal structures was performed.  COMPARISON:  None.  FINDINGS: Right testicle  Measurements: Approximately 4.0 x 2.7 x 3.0 cm. Normal parenchymal echotexture without mass or microlithiasis. Normal color Doppler flow  without evidence of hyperemia.  Left testicle  Measurements: Approximately 4.3 x 1.7 x 2.8 cm. Normal parenchymal echotexture without mass or microlithiasis. Normal color Doppler flow without evidence of hyperemia.  Right epididymis: Normal in size containing an approximate 0.9 x 1.2 x 1.0 cm cyst or spermatocele. No evidence of hyperemia on color Doppler evaluation.  Left epididymis: Approximate 2.0 x 1.4 x 1.3 cm hypoechoic mass with internal color Doppler flow adjacent to or arising from the right epididymal head. No evidence of hyperemia involving the epididymis on color Doppler evaluation.  Hydrocele:  Small bilateral hydroceles.  Varicocele:  Left varicocele.  IMPRESSION: 1. Approximate 2 cm mass arising from or  adjacent to the right epididymal head. Differential diagnosis might include a fibrous pseudotumor related to prior inflammatory disease, an adenomatoid tumor, or a granulomatous mass which can be seen in patients with sarcoidosis. Urology consultation may be helpful. 2. Normal-appearing testicles. 3. Approximate 1 cm cyst or spermatocele in the right epididymal head. 4. Left varicocele.  Small bilateral hydroceles.   Electronically Signed   By: Evangeline Dakin M.D.   On: 04/19/2013 15:06   Dg Chest Port 1 View  04/18/2013   CLINICAL DATA:  Shortness of breath.  Weakness.  COPD.  EXAM: PORTABLE CHEST - 1 VIEW  COMPARISON:  10/25/2012  FINDINGS: The heart size and mediastinal contours are within normal limits. Both lungs are clear. No evidence of pneumothorax or pleural effusion. Several bold left rib fracture deformities noted.  IMPRESSION: No active disease.   Electronically Signed   By: Earle Gell M.D.   On: 04/18/2013 16:04    Microbiology: Recent Results (from the past 240 hour(s))  MRSA PCR SCREENING     Status: None   Collection Time    04/18/13  8:26 PM      Result Value Ref Range Status   MRSA by PCR NEGATIVE  NEGATIVE Final   Comment:            The GeneXpert MRSA Assay (FDA      approved for NASAL specimens     only), is one component of a     comprehensive MRSA colonization     surveillance program. It is not     intended to diagnose MRSA     infection nor to guide or     monitor treatment for     MRSA infections.     Labs: Basic Metabolic Panel:  Recent Labs Lab 04/18/13 1421 04/19/13 1015 04/21/13 0406  NA 137 139 141  K 5.0 4.0 3.9  CL 105 107 107  CO2 21 21 23   GLUCOSE 128* 97 90  BUN 36* 24* 11  CREATININE 0.82 0.88 0.87  CALCIUM 8.2* 8.2* 8.1*   Liver Function Tests: No results found for this basename: AST, ALT, ALKPHOS, BILITOT, PROT, ALBUMIN,  in the last 168 hours No results found for this basename: LIPASE, AMYLASE,  in the last 168 hours No results found for this basename: AMMONIA,  in the last 168 hours CBC:  Recent Labs Lab 04/18/13 1421 04/19/13 1015 04/20/13 1117 04/21/13 0406 04/22/13 0424  WBC 11.7* 9.7  --  7.2 7.8  NEUTROABS 9.5*  --   --   --   --   HGB 7.6* 8.7* 8.8* 8.2* 8.1*  HCT 23.4* 26.2* 25.9* 24.9* 25.0*  MCV 97.5 91.9  --  93.6 94.3  PLT 248 162  --  158 180   Cardiac Enzymes: No results found for this basename: CKTOTAL, CKMB, CKMBINDEX, TROPONINI,  in the last 168 hours BNP: BNP (last 3 results)  Recent Labs  09/20/12 1500 04/18/13 1421  PROBNP 567.3* 261.9   CBG: No results found for this basename: GLUCAP,  in the last 168 hours     Signed:  Josey Dettmann  [facility] 04/22/2013, 8:47 AM

## 2013-04-22 NOTE — Progress Notes (Signed)
[facility] Progress Note  Subjective:  Had a BM last night with some maroon colored blood mixed with stool.  No BM yet today; he is asking for something to help him move his bowels.  Eating a regular diet.  He wants the bed alarm to be turned off so he can transfer from bed to chair on his own.  Objective:  Vital signs in last 24 hours: Temp:  [97.4 F (36.3 C)-98.2 F (36.8 C)] 98.2 F (36.8 C) (02/23 0629) Pulse Rate:  [73-97] 73 (02/23 0629) Resp:  [18] 18 (02/23 0629) BP: (103-132)/(54-70) 105/59 mmHg (02/23 0629) SpO2:  [94 %-97 %] 94 % (02/23 0629) Weight:  [164 lb 10.9 oz (74.7 kg)] 164 lb 10.9 oz (74.7 kg) (02/23 0629) Last BM Date: 04/21/13 General:  Alert, elderly white male, in NAD Heart:  Regular rate and rhythm; no murmurs Pulm:  CTAB.  No W/R/R. Abdomen:  Soft, non-distended. Normal bowel sounds.  Non-tender. Extremities:  Without edema. Neurologic:  Alert and  oriented x4;  grossly normal neurologically. Psych:  Alert and cooperative. Normal mood and affect.  Intake/Output from previous day: 02/22 0701 - 02/23 0700 In: 563 [P.O.:560; I.V.:3] Out: 800 [Urine:800] Intake/Output this shift: Total I/O In: 240 [P.O.:240] Out: 175 [Urine:175]  Lab Results:  Recent Labs  04/19/13 1015 04/20/13 1117 04/21/13 0406 04/22/13 0424  WBC 9.7  --  7.2 7.8  HGB 8.7* 8.8* 8.2* 8.1*  HCT 26.2* 25.9* 24.9* 25.0*  PLT 162  --  158 180   BMET  Recent Labs  04/19/13 1015 04/21/13 0406  NA 139 141  K 4.0 3.9  CL 107 107  CO2 21 23  GLUCOSE 97 90  BUN 24* 11  CREATININE 0.88 0.87  CALCIUM 8.2* 8.1*   Assessment / Plan: 1.  Esophageal ulcer with clean base at the GE junction as seen on EGD 2/21. 2.  Acute upper GI bleeding secondary to #1.   3.  Normocytic anemia. Difficult to know baseline given wide fluctuations over the years. Presenting hgb 7.6 grams, now only 8.1 grams post 3 units of PRBC's. 4. Hx of adenomatous colon polyps, up to date on  screening.   -Monitor Hgb; transfuse for Hgb <8 grams.  May want to give another unit prior to discharge. -Avoid NSAID's. -Continue Pantoprazole 40 mg BID for 8 weeks then once daily indefinitely. -Will give dose of Miralax today.    LOS: 4 days   ZEHR, JESSICA D.  04/22/2013, 8:10 AM  Pager number [phone]    Attending physician's note   I have taken an interval history, reviewed the chart and examined the patient. I agree with the Advanced Practitioner's note, impression and recommendations. PPI BID for 8 weeks then PPI daily forever for GERD, esophageal ulcer. Avoid NSAIDs. Monitor Hb/Hct for another 1-2 days. GI available if needed. GI signing off.   Pricilla Riffle. Fuller Plan, MD [facility]

## 2013-04-22 NOTE — Progress Notes (Signed)
PT Cancellation Note  Patient Details Name: [name] MRN: [id number] DOB: 1932-10-31   Cancelled Treatment:    Reason Eval/Treat Not Completed: Pt receiving blood. Family reports pt walked around unit 2 laps earlier. Will check back another day.    Weston Anna, MPT Pager: [phone]

## 2013-04-22 NOTE — Progress Notes (Signed)
[facility] PROGRESS NOTE  [name] ZOX:[id number] DOB: [birth year] DOA: 04/18/2013 PCP: Redge Gainer, MD Brief HPI: 80 year old male with a history of COPD, coronary artery disease status post stenting, chronic anemia on iron, hypothyroidism who presented to the emergency department with main concern of several days duration of progressively worsening shortness of breath, mostly exertional but now present at rest as well, associated with nausea, poor oral intake, malaise, dizziness. He denies chest pain, no fevers, chills, no abdominal or urinary concerns, no recent sicknesses/hospitalizations, no sick contacts/exposures, no focal neurological symptoms.  In ED, pt stable but found to have initial oxygen saturation in high 80's which quickle improved to mid 90's with oxygen. Hg noted to be 7.6. [facility] asked to admit to telemetry bed for further evaluation. 3 U of PRBC ordered in ED for transfusion.  Assessment/Plan: Shortness of breath on exertion:  - most likely secondary to acute blood loss anemia. - CBC in AM  Acute blood loss anemia  - has a h/o esophageal ruupture 4 years ago.  - s/p 3units of prbc transfusion,. - GI consulted. And he underwent EGD on 2/21, he was found to have ulcer,recommended sPPI Twice a day for 2 weeks followed by one week.  - last colonoscopy by Dr. Philip Aspen in 2012 with diverticulosis and otherwise normal colonoscopy  - hold all heparin products due to high risk bleed  - place on SCD's for DVT prophylaxis  Hypotension  - secondary to acute blood loss anemia  - resolved with blood transfusions.  Leukocytosis  Probably reactive. Resolved.   Hypothyroidism  -  continue home regimen with synthroid   Code Status: Full  Family Communication: Pt at bedside  Disposition Plan: Admit to telemetry bed     Consultants:  GI consult from [place].   Procedures:  3 units of prbc transfusion  Antibiotics:  none  HPI/Subjective: Comfortable.  No  sob.  Pain and discomfort in the scrotum  US SCROTUM ORDERED.   Objective: Filed Vitals:   04/22/13 1620  BP: 125/68  Pulse: 80  Temp: 97.7 F (36.5 C)  Resp: 16    Intake/Output Summary (Last 24 hours) at 04/22/13 1836 Last data filed at 04/22/13 1620  Gross per 24 hour  Intake    956 ml  Output    725 ml  Net    231 ml   Filed Weights   04/20/13 0520 04/21/13 0349 04/22/13 0629  Weight: 75.025 kg (165 lb 6.4 oz) 74.5 kg (164 lb 3.9 oz) 74.7 kg (164 lb 10.9 oz)    Exam:   General:  Alert afebrile comforatable  Cardiovascular: s1s2  Respiratory: ctab  Abdomen: soft NT nd bs+  Musculoskeletal: trace pedal edema.   Data Reviewed: Basic Metabolic Panel:  Recent Labs Lab 04/18/13 1421 04/19/13 1015 04/21/13 0406  NA 137 139 141  K 5.0 4.0 3.9  CL 105 107 107  CO2 21 21 23   GLUCOSE 128* 97 90  BUN 36* 24* 11  CREATININE 0.82 0.88 0.87  CALCIUM 8.2* 8.2* 8.1*   Liver Function Tests: No results found for this basename: AST, ALT, ALKPHOS, BILITOT, PROT, ALBUMIN,  in the last 168 hours No results found for this basename: LIPASE, AMYLASE,  in the last 168 hours No results found for this basename: AMMONIA,  in the last 168 hours CBC:  Recent Labs Lab 04/18/13 1421 04/19/13 1015 04/20/13 1117 04/21/13 0406 04/22/13 0424  WBC 11.7* 9.7  --  7.2 7.8  NEUTROABS 9.5*  --   --   --   --  HGB 7.6* 8.7* 8.8* 8.2* 8.1*  HCT 23.4* 26.2* 25.9* 24.9* 25.0*  MCV 97.5 91.9  --  93.6 94.3  PLT 248 162  --  158 180   Cardiac Enzymes: No results found for this basename: CKTOTAL, CKMB, CKMBINDEX, TROPONINI,  in the last 168 hours BNP (last 3 results)  Recent Labs  09/20/12 1500 04/18/13 1421  PROBNP 567.3* 261.9   CBG: No results found for this basename: GLUCAP,  in the last 168 hours  Recent Results (from the past 240 hour(s))  MRSA PCR SCREENING     Status: None   Collection Time    04/18/13  8:26 PM      Result Value Ref Range Status   MRSA by PCR  NEGATIVE  NEGATIVE Final   Comment:            The GeneXpert MRSA Assay (FDA     approved for NASAL specimens     only), is one component of a     comprehensive MRSA colonization     surveillance program. It is not     intended to diagnose MRSA     infection nor to guide or     monitor treatment for     MRSA infections.     Studies: No results found.  Scheduled Meds: . bromocriptine  2.5 mg Oral QHS  . budesonide-formoterol  2 puff Inhalation BID  . ferrous sulfate  325 mg Oral Daily  . levothyroxine  50 mcg Oral QAC breakfast  . magnesium oxide  400 mg Oral Daily  . pantoprazole (PROTONIX) IV  40 mg Intravenous Q12H  . polyethylene glycol  17 g Oral Daily  . sodium chloride  3 mL Intravenous Q12H  . tamsulosin  0.4 mg Oral Daily   Continuous Infusions:   Active Problems:   HYPOTHYROIDISM   ANEMIA   GI BLEED   Acute respiratory failure   Esophageal ulcer with bleeding    Time spent: 35 min    [facility] Pager [phone] 7PM-7AM, please contact night-coverage at [email or web address], password [facility] 04/22/2013, 6:36 PM  LOS: 4 days

## 2013-04-23 ENCOUNTER — Encounter: Payer: Self-pay | Admitting: Family Medicine

## 2013-04-23 LAB — TYPE AND SCREEN
ABO/RH(D): O POS
Antibody Screen: POSITIVE
DAT, IgG: NEGATIVE
Donor AG Type: NEGATIVE
Unit division: 0

## 2013-04-23 LAB — HEMOGLOBIN AND HEMATOCRIT, BLOOD
HCT: 31.3 % — ABNORMAL LOW (ref 39.0–52.0)
Hemoglobin: 10.4 g/dL — ABNORMAL LOW (ref 13.0–17.0)

## 2013-04-23 MED ORDER — MAGNESIUM HYDROXIDE 400 MG/5ML PO SUSP
30.0000 mL | Freq: Once | ORAL | Status: AC
  Administered 2013-04-23: 30 mL via ORAL
  Filled 2013-04-23: qty 30

## 2013-04-24 NOTE — Telephone Encounter (Signed)
appt scheduled with dr. Laurance Flatten at 2:30

## 2013-04-29 ENCOUNTER — Encounter: Payer: Self-pay | Admitting: Family Medicine

## 2013-04-29 ENCOUNTER — Ambulatory Visit (INDEPENDENT_AMBULATORY_CARE_PROVIDER_SITE_OTHER): Payer: Medicare Other | Admitting: Family Medicine

## 2013-04-29 VITALS — BP 131/79 | HR 78 | Temp 96.6°F | Ht 67.0 in | Wt 172.0 lb

## 2013-04-29 DIAGNOSIS — R531 Weakness: Secondary | ICD-10-CM

## 2013-04-29 DIAGNOSIS — D62 Acute posthemorrhagic anemia: Secondary | ICD-10-CM

## 2013-04-29 DIAGNOSIS — R0602 Shortness of breath: Secondary | ICD-10-CM

## 2013-04-29 DIAGNOSIS — R5383 Other fatigue: Secondary | ICD-10-CM

## 2013-04-29 DIAGNOSIS — R5381 Other malaise: Secondary | ICD-10-CM

## 2013-04-29 DIAGNOSIS — Z8719 Personal history of other diseases of the digestive system: Secondary | ICD-10-CM

## 2013-04-29 LAB — POCT CBC
Granulocyte percent: 70.9 %G (ref 37–80)
HCT, POC: 36.4 % — AB (ref 43.5–53.7)
Hemoglobin: 11.4 g/dL — AB (ref 14.1–18.1)
Lymph, poc: 1.4 (ref 0.6–3.4)
MCH, POC: 29.2 pg (ref 27–31.2)
MCHC: 31.3 g/dL — AB (ref 31.8–35.4)
MCV: 93.4 fL (ref 80–97)
MPV: 7.6 fL (ref 0–99.8)
POC Granulocyte: 5 (ref 2–6.9)
POC LYMPH PERCENT: 20 %L (ref 10–50)
Platelet Count, POC: 326 10*3/uL (ref 142–424)
RBC: 3.9 M/uL — AB (ref 4.69–6.13)
RDW, POC: 17 %
WBC: 7.1 10*3/uL (ref 4.6–10.2)

## 2013-04-29 MED ORDER — TAMSULOSIN HCL 0.4 MG PO CAPS: 0.4000 mg | ORAL_CAPSULE | Freq: Every day | ORAL | Status: DC

## 2013-04-29 NOTE — Progress Notes (Signed)
Subjective:    Patient ID: [name], male    DOB: Jan 15, 1933, 80 y.o.   MRN: [id number]  HPI Patient here today for hospital follow up from Gi bleed. Patient comes in today for follow up of this acute GI bleed. He does complain of some dizziness and weakness and fatigue. According to the endoscopy note it was an esophageal ulcer. He will get a CBC BMP and LFTs today. Patient comes in to the visit today with his wife. He has a positive attitude his color is good and he is pleasant     Patient Active Problem List   Diagnosis Date Noted  . Esophageal ulcer with bleeding 04/20/2013  . Acute respiratory failure 04/18/2013  . S/P left and ritght THA, AA 07/21/2011  . Special screening for malignant neoplasms, colon 01/03/2011  . Personal history of colonic polyps 01/03/2011  . COPD GOLD I 08/24/2010  . GI BLEED 11/17/2009  . ESOPHAGEAL REFLUX 08/25/2009  . BLOOD IN STOOL-MELENA 08/25/2009  . COLONIC POLYPS 07/17/2008  . ANEMIA 07/17/2008  . HYPOTENSION 07/17/2008  . ESOPHAGITIS 07/17/2008  . DIVERTICULOSIS, MILD 07/17/2008  . DEGENERATIVE JOINT DISEASE 07/17/2008  . OSTEOPOROSIS 07/17/2008  . BENIGN PROSTATIC HYPERTROPHY, HX OF 07/17/2008  . HYPERPROLACTINEMIA 10/24/2007  . PITUITARY ADENOMA 06/25/2007  . HYPOTHYROIDISM 06/25/2007  . HYPERLIPIDEMIA 06/22/2007  . CORONARY ARTERY DISEASE 06/22/2007   Outpatient Encounter Prescriptions as of 04/29/2013  Medication Sig  . albuterol (PROVENTIL HFA;VENTOLIN HFA) 108 (90 BASE) MCG/ACT inhaler Inhale 2 puffs into the lungs every 6 (six) hours as needed for wheezing or shortness of breath.  . bromocriptine (PARLODEL) 2.5 MG tablet Take 2.5 mg by mouth at bedtime.   . budesonide-formoterol (SYMBICORT) 160-4.5 MCG/ACT inhaler Inhale 2 puffs into the lungs 2 (two) times daily.  . cholecalciferol (VITAMIN D) 1000 UNITS tablet Take 1,000 Units by mouth 2 (two) times daily.  . ferrous sulfate 325 (65 FE) MG tablet Take 325 mg by mouth  daily.   Marland Kitchen guaiFENesin (MUCINEX) 600 MG 12 hr tablet Take 600 mg by mouth 2 (two) times daily.   Marland Kitchen levothyroxine (SYNTHROID, LEVOTHROID) 50 MCG tablet Take 50 mcg by mouth daily before breakfast.  . magnesium oxide (MAG-OX) 400 MG tablet Take 400 mg by mouth daily.  . montelukast (SINGULAIR) 10 MG tablet Take 10 mg by mouth daily as needed (for allergies).   . nitroGLYCERIN (NITROSTAT) 0.4 MG SL tablet Place 0.4 mg under the tongue every 5 (five) minutes as needed for chest pain.  . pantoprazole (PROTONIX) 40 MG tablet Take 1 tablet (40 mg total) by mouth 2 (two) times daily.  Derrill Memo ON 06/21/2013] pantoprazole (PROTONIX) 40 MG tablet Take 1 tablet (40 mg total) by mouth daily.  . polyethylene glycol (MIRALAX / GLYCOLAX) packet Take 17 g by mouth daily.  . tamsulosin (FLOMAX) 0.4 MG CAPS Take 1 capsule (0.4 mg total) by mouth daily.  Marland Kitchen tetrahydrozoline (VISINE) 0.05 % ophthalmic solution Place 1 drop into both eyes as needed (For dry eyes).  . traMADol (ULTRAM) 50 MG tablet Take 50 mg by mouth every 6 (six) hours as needed for pain.  . vitamin E (VITAMIN E) 400 UNIT capsule Take 400 Units by mouth daily.    Review of Systems  Constitutional: Positive for fatigue.  HENT: Negative.   Eyes: Negative.   Respiratory: Negative.   Cardiovascular: Negative.   Gastrointestinal: Negative.   Endocrine: Negative.   Genitourinary: Negative.   Musculoskeletal: Negative.   Skin: Negative.  Allergic/Immunologic: Negative.   Neurological: Positive for weakness and light-headedness.  Hematological: Negative.   Psychiatric/Behavioral: Negative.        Objective:   Physical Exam  Nursing note and vitals reviewed. Constitutional: He is oriented to person, place, and time. He appears well-developed and well-nourished. No distress.  HENT:  Head: Normocephalic and atraumatic.  Right Ear: External ear normal.  Left Ear: External ear normal.  Mouth/Throat: Oropharynx is clear and moist. No  oropharyngeal exudate.  Eyes: Conjunctivae and EOM are normal. Pupils are equal, round, and reactive to light. Right eye exhibits no discharge. Left eye exhibits no discharge. No scleral icterus.  Neck: Normal range of motion. Neck supple. No thyromegaly present.  Cardiovascular: Normal rate, regular rhythm and normal heart sounds.  Exam reveals no gallop and no friction rub.   No murmur heard. 72 per minute  Pulmonary/Chest: Effort normal and breath sounds normal. No respiratory distress. He has no wheezes. He has no rales.  Abdominal: Soft. Bowel sounds are normal. He exhibits no mass. There is no tenderness. There is no rebound and no guarding.  Musculoskeletal: Normal range of motion. He exhibits no edema and no tenderness.  A callus was noted on the medial aspect of the left heel  Lymphadenopathy:    He has no cervical adenopathy.  Neurological: He is alert and oriented to person, place, and time. He has normal reflexes. No cranial nerve deficit.  Skin: Skin is warm and dry. No rash noted. No erythema. No pallor.  Psychiatric: He has a normal mood and affect. His behavior is normal. Judgment and thought content normal.   BP 131/79  Pulse 78  Temp(Src) 96.6 F (35.9 C) (Oral)  Ht $R'5\' 7"'gw$  (1.702 m)  Wt 172 lb (78.019 kg)  BMI 26.93 kg/m2 Results for orders placed in visit on 04/29/13  POCT CBC      Result Value Ref Range   WBC 7.1  4.6 - 10.2 K/uL   Lymph, poc 1.4  0.6 - 3.4   POC LYMPH PERCENT 20.0  10 - 50 %L   POC Granulocyte 5.0  2 - 6.9   Granulocyte percent 70.9  37 - 80 %G   RBC 3.9 (*) 4.69 - 6.13 M/uL   Hemoglobin 11.4 (*) 14.1 - 18.1 g/dL   HCT, POC 36.4 (*) 43.5 - 53.7 %   MCV 93.4  80 - 97 fL   MCH, POC 29.2  27 - 31.2 pg   MCHC 31.3 (*) 31.8 - 35.4 g/dL   RDW, POC 17.0     Platelet Count, POC 326.0  142 - 424 K/uL   MPV 7.6  0 - 99.8 fL          Assessment & Plan:  1. H/O: GI bleed - POCT CBC - BMP8+EGFR - Hepatic function panel  2. Acute blood loss  anemia  3. Shortness of breath  4. Weakness Patient Instructions  Remain off of aspirin Continue Protonix twice daily Follow up with gastroenterologist as planned Repeat CBC in 2 weeks   Arrie Senate MD

## 2013-04-29 NOTE — Patient Instructions (Signed)
Remain off of aspirin Continue Protonix twice daily Follow up with gastroenterologist as planned Repeat CBC in 2 weeks

## 2013-04-30 ENCOUNTER — Encounter: Payer: Self-pay | Admitting: Internal Medicine

## 2013-04-30 ENCOUNTER — Encounter: Payer: Self-pay | Admitting: Family Medicine

## 2013-04-30 ENCOUNTER — Other Ambulatory Visit: Payer: Self-pay | Admitting: Endocrinology

## 2013-04-30 LAB — HEPATIC FUNCTION PANEL
ALT: 10 IU/L (ref 0–44)
AST: 20 IU/L (ref 0–40)
Albumin: 3.7 g/dL (ref 3.5–4.8)
Alkaline Phosphatase: 98 IU/L (ref 39–117)
Bilirubin, Direct: 0.11 mg/dL (ref 0.00–0.40)
Total Bilirubin: 0.3 mg/dL (ref 0.0–1.2)
Total Protein: 6.3 g/dL (ref 6.0–8.5)

## 2013-04-30 LAB — BMP8+EGFR
BUN/Creatinine Ratio: 12 (ref 10–22)
BUN: 11 mg/dL (ref 8–27)
CO2: 21 mmol/L (ref 18–29)
Calcium: 8.9 mg/dL (ref 8.6–10.2)
Chloride: 105 mmol/L (ref 97–108)
Creatinine, Ser: 0.95 mg/dL (ref 0.76–1.27)
GFR calc Af Amer: 88 mL/min/{1.73_m2} (ref >59)
GFR calc non Af Amer: 76 mL/min/{1.73_m2} (ref >59)
Glucose: 97 mg/dL (ref 65–99)
Potassium: 4.3 mmol/L (ref 3.5–5.2)
Sodium: 141 mmol/L (ref 134–144)

## 2013-04-30 NOTE — Addendum Note (Signed)
Addended by: Zannie Cove on: 04/30/2013 10:01 AM   Modules accepted: Orders

## 2013-05-09 ENCOUNTER — Encounter: Payer: Self-pay | Admitting: Family Medicine

## 2013-05-10 ENCOUNTER — Encounter: Payer: Self-pay | Admitting: *Deleted

## 2013-05-13 ENCOUNTER — Other Ambulatory Visit (INDEPENDENT_AMBULATORY_CARE_PROVIDER_SITE_OTHER): Payer: Medicare Other

## 2013-05-13 DIAGNOSIS — D62 Acute posthemorrhagic anemia: Secondary | ICD-10-CM

## 2013-05-13 LAB — POCT CBC
Granulocyte percent: 64.3 %G (ref 37–80)
HCT, POC: 37.9 % — AB (ref 43.5–53.7)
Hemoglobin: 11.6 g/dL — AB (ref 14.1–18.1)
Lymph, poc: 1.5 (ref 0.6–3.4)
MCH, POC: 28.4 pg (ref 27–31.2)
MCHC: 30.6 g/dL — AB (ref 31.8–35.4)
MCV: 92.8 fL (ref 80–97)
MPV: 7.5 fL (ref 0–99.8)
POC Granulocyte: 4.4 (ref 2–6.9)
POC LYMPH PERCENT: 22.1 %L (ref 10–50)
Platelet Count, POC: 249 10*3/uL (ref 142–424)
RBC: 4.1 M/uL — AB (ref 4.69–6.13)
RDW, POC: 15.9 %
WBC: 6.9 10*3/uL (ref 4.6–10.2)

## 2013-05-13 NOTE — Progress Notes (Signed)
Pt came in for labs only 

## 2013-05-28 ENCOUNTER — Encounter: Payer: Self-pay | Admitting: Family Medicine

## 2013-05-28 ENCOUNTER — Ambulatory Visit (INDEPENDENT_AMBULATORY_CARE_PROVIDER_SITE_OTHER): Payer: Medicare Other | Admitting: Family Medicine

## 2013-05-28 VITALS — BP 140/80 | HR 63 | Temp 96.5°F | Ht 67.0 in | Wt 174.0 lb

## 2013-05-28 DIAGNOSIS — Z862 Personal history of diseases of the blood and blood-forming organs and certain disorders involving the immune mechanism: Secondary | ICD-10-CM

## 2013-05-28 DIAGNOSIS — I7090 Unspecified atherosclerosis: Secondary | ICD-10-CM

## 2013-05-28 DIAGNOSIS — I2510 Atherosclerotic heart disease of native coronary artery without angina pectoris: Secondary | ICD-10-CM

## 2013-05-28 DIAGNOSIS — J449 Chronic obstructive pulmonary disease, unspecified: Secondary | ICD-10-CM

## 2013-05-28 DIAGNOSIS — K219 Gastro-esophageal reflux disease without esophagitis: Secondary | ICD-10-CM

## 2013-05-28 DIAGNOSIS — Z87898 Personal history of other specified conditions: Secondary | ICD-10-CM

## 2013-05-28 DIAGNOSIS — D649 Anemia, unspecified: Secondary | ICD-10-CM

## 2013-05-28 DIAGNOSIS — Z8639 Personal history of other endocrine, nutritional and metabolic disease: Secondary | ICD-10-CM

## 2013-05-28 LAB — POCT CBC
Granulocyte percent: 68.8 %G (ref 37–80)
HCT, POC: 38.6 % — AB (ref 43.5–53.7)
Hemoglobin: 12 g/dL — AB (ref 14.1–18.1)
Lymph, poc: 1.5 (ref 0.6–3.4)
MCH, POC: 28.9 pg (ref 27–31.2)
MCHC: 31.2 g/dL — AB (ref 31.8–35.4)
MCV: 92.8 fL (ref 80–97)
MPV: 7.4 fL (ref 0–99.8)
POC Granulocyte: 4.8 (ref 2–6.9)
POC LYMPH PERCENT: 20.9 %L (ref 10–50)
Platelet Count, POC: 240 10*3/uL (ref 142–424)
RBC: 4.15 M/uL — AB (ref 4.69–6.13)
RDW, POC: 15.9 %
WBC: 7 10*3/uL (ref 4.6–10.2)

## 2013-05-28 NOTE — Patient Instructions (Addendum)
Continue iron daily Continue protonix twice daily Discuss these medications with the gastroenterologist Keep appointment with the urologist and the neurosurgeon

## 2013-05-28 NOTE — Progress Notes (Signed)
Subjective:    Patient ID: [name], male    DOB: September 29, 1932, 80 y.o.   MRN: [id number]  HPI Patient here today for 4 week follow up on anemia, SOB, and weakness. He is alert and cooperative and feeling better. He has visits scheduled with the gastroenterologist and the neurosurgeon.      Patient Active Problem List   Diagnosis Date Noted  . Esophageal ulcer with bleeding 04/20/2013  . Acute respiratory failure 04/18/2013  . S/P left and ritght THA, AA 07/21/2011  . Special screening for malignant neoplasms, colon 01/03/2011  . Personal history of colonic polyps 01/03/2011  . COPD GOLD I 08/24/2010  . GI BLEED 11/17/2009  . ESOPHAGEAL REFLUX 08/25/2009  . BLOOD IN STOOL-MELENA 08/25/2009  . COLONIC POLYPS 07/17/2008  . ANEMIA 07/17/2008  . HYPOTENSION 07/17/2008  . ESOPHAGITIS 07/17/2008  . DIVERTICULOSIS, MILD 07/17/2008  . DEGENERATIVE JOINT DISEASE 07/17/2008  . OSTEOPOROSIS 07/17/2008  . BENIGN PROSTATIC HYPERTROPHY, HX OF 07/17/2008  . HYPERPROLACTINEMIA 10/24/2007  . PITUITARY ADENOMA 06/25/2007  . HYPOTHYROIDISM 06/25/2007  . HYPERLIPIDEMIA 06/22/2007  . CORONARY ARTERY DISEASE 06/22/2007   Outpatient Encounter Prescriptions as of 05/28/2013  Medication Sig  . albuterol (PROVENTIL HFA;VENTOLIN HFA) 108 (90 BASE) MCG/ACT inhaler Inhale 2 puffs into the lungs every 6 (six) hours as needed for wheezing or shortness of breath.  . bromocriptine (PARLODEL) 2.5 MG tablet TAKE ONE TABLET AT BEDTIME  . budesonide-formoterol (SYMBICORT) 160-4.5 MCG/ACT inhaler Inhale 2 puffs into the lungs 2 (two) times daily.  . cholecalciferol (VITAMIN D) 1000 UNITS tablet Take 1,000 Units by mouth 2 (two) times daily.  . ferrous sulfate 325 (65 FE) MG tablet Take 325 mg by mouth daily.   Marland Kitchen guaiFENesin (MUCINEX) 600 MG 12 hr tablet Take 600 mg by mouth 2 (two) times daily.   Marland Kitchen levothyroxine (SYNTHROID, LEVOTHROID) 50 MCG tablet Take 50 mcg by mouth daily before breakfast.  .  magnesium oxide (MAG-OX) 400 MG tablet Take 400 mg by mouth daily.  . montelukast (SINGULAIR) 10 MG tablet Take 10 mg by mouth daily as needed (for allergies).   . nitroGLYCERIN (NITROSTAT) 0.4 MG SL tablet Place 0.4 mg under the tongue every 5 (five) minutes as needed for chest pain.  Derrill Memo ON 06/21/2013] pantoprazole (PROTONIX) 40 MG tablet Take 1 tablet (40 mg total) by mouth daily.  . polyethylene glycol (MIRALAX / GLYCOLAX) packet Take 17 g by mouth daily.  . tamsulosin (FLOMAX) 0.4 MG CAPS capsule Take 1 capsule (0.4 mg total) by mouth daily.  Marland Kitchen tetrahydrozoline (VISINE) 0.05 % ophthalmic solution Place 1 drop into both eyes as needed (For dry eyes).  . traMADol (ULTRAM) 50 MG tablet Take 50 mg by mouth every 6 (six) hours as needed for pain.  . vitamin E (VITAMIN E) 400 UNIT capsule Take 400 Units by mouth daily.  . [DISCONTINUED] bromocriptine (PARLODEL) 2.5 MG tablet Take 2.5 mg by mouth at bedtime.   . [DISCONTINUED] pantoprazole (PROTONIX) 40 MG tablet Take 1 tablet (40 mg total) by mouth 2 (two) times daily.    Review of Systems  Constitutional: Negative.   HENT: Negative.   Eyes: Negative.   Respiratory: Negative.   Cardiovascular: Negative.   Gastrointestinal: Negative.   Endocrine: Negative.   Genitourinary: Negative.   Musculoskeletal: Negative.        Off/on tingling and redness in left hand and wrist  Skin: Negative.   Allergic/Immunologic: Negative.   Neurological: Negative.   Hematological: Negative.  Psychiatric/Behavioral: Negative.        Objective:   Physical Exam  Nursing note and vitals reviewed. Constitutional: He is oriented to person, place, and time. He appears well-developed and well-nourished. No distress.  HENT:  Head: Normocephalic and atraumatic.  Right Ear: External ear normal.  Left Ear: External ear normal.  Nose: Nose normal.  Mouth/Throat: Oropharynx is clear and moist. No oropharyngeal exudate.  Eyes: Conjunctivae and EOM are  normal. Pupils are equal, round, and reactive to light. Right eye exhibits no discharge. Left eye exhibits no discharge. No scleral icterus.  Neck: Normal range of motion. Neck supple. No thyromegaly present.  Cardiovascular: Normal rate, regular rhythm and normal heart sounds.   No murmur heard. At 72 per minute  Pulmonary/Chest: Effort normal and breath sounds normal. No respiratory distress. He has no wheezes. He has no rales. He exhibits no tenderness.  Abdominal: Soft. Bowel sounds are normal. He exhibits no mass. There is no tenderness. There is no rebound and no guarding.  Genitourinary: Penis normal.  Left supero-testicular mass that has been followed by the urologist continues to get smaller per patient  Musculoskeletal: Normal range of motion. He exhibits no edema.  Lymphadenopathy:    He has no cervical adenopathy.  Neurological: He is alert and oriented to person, place, and time.  Skin: Skin is warm and dry. No rash noted.  Psychiatric: He has a normal mood and affect. His behavior is normal. Judgment and thought content normal.   BP 140/80  Pulse 63  Temp(Src) 96.5 F (35.8 C) (Oral)  Ht 5\' 7"  (1.702 m)  Wt 174 lb (78.926 kg)  BMI 27.25 kg/m2 Results for orders placed in visit on 05/28/13  POCT CBC      Result Value Ref Range   WBC 7.0  4.6 - 10.2 K/uL   Lymph, poc 1.5  0.6 - 3.4   POC LYMPH PERCENT 20.9  10 - 50 %L   POC Granulocyte 4.8  2 - 6.9   Granulocyte percent 68.8  37 - 80 %G   RBC 4.15 (*) 4.69 - 6.13 M/uL   Hemoglobin 12.0 (*) 14.1 - 18.1 g/dL   HCT, POC 38.6 (*) 43.5 - 53.7 %   MCV 92.8  80 - 97 fL   MCH, POC 28.9  27 - 31.2 pg   MCHC 31.2 (*) 31.8 - 35.4 g/dL   RDW, POC 15.9     Platelet Count, POC 240.0  142 - 424 K/uL   MPV 7.4  0 - 99.8 fL           Assessment & Plan:  1. Anemia - POCT CBC  2. History of pituitary tumor  3. ASCVD (arteriosclerotic cardiovascular disease)  4. GERD (gastroesophageal reflux disease)  5. COPD (chronic  obstructive pulmonary disease) Patient Instructions  Continue iron daily Continue protonix twice daily Discuss these medications with the gastroenterologist Keep appointment with the urologist and the neurosurgeon   Arrie Senate MD

## 2013-05-30 ENCOUNTER — Encounter: Payer: Self-pay | Admitting: Family Medicine

## 2013-05-31 ENCOUNTER — Encounter: Payer: Self-pay | Admitting: Internal Medicine

## 2013-05-31 ENCOUNTER — Encounter: Payer: Self-pay | Admitting: Endocrinology

## 2013-06-04 ENCOUNTER — Other Ambulatory Visit: Payer: Self-pay | Admitting: Family Medicine

## 2013-06-07 ENCOUNTER — Ambulatory Visit: Payer: Medicare Other | Admitting: Internal Medicine

## 2013-06-07 ENCOUNTER — Ambulatory Visit (INDEPENDENT_AMBULATORY_CARE_PROVIDER_SITE_OTHER): Payer: Medicare Other | Admitting: Neurology

## 2013-06-07 ENCOUNTER — Encounter: Payer: Self-pay | Admitting: Neurology

## 2013-06-07 VITALS — BP 121/73 | HR 67 | Ht 66.5 in | Wt 174.0 lb

## 2013-06-07 DIAGNOSIS — R269 Unspecified abnormalities of gait and mobility: Secondary | ICD-10-CM

## 2013-06-07 DIAGNOSIS — D353 Benign neoplasm of craniopharyngeal duct: Principal | ICD-10-CM

## 2013-06-07 DIAGNOSIS — D352 Benign neoplasm of pituitary gland: Secondary | ICD-10-CM

## 2013-06-07 HISTORY — DX: Unspecified abnormalities of gait and mobility: R26.9

## 2013-06-07 NOTE — Progress Notes (Signed)
Reason for visit: Gait disorder  [name] is a 80 y.o. male  History of present illness:  [name] is a 80 year old right-handed white male with a history of a pituitary macroadenoma that was found incidentally following a MRI evaluation of the cervical spine. The patient has slightly elevated prolactin levels, and he has been on low-dose Parlodel as a treatment for this. The patient indicates that he has had some issues with balance that have been present for 3-4 years. The patient been using a cane for that period of time. The patient indicates that the walking has gradually worsened over time, and serial evaluations of the brain had shown a mild enlargement of the ventricular space. The patient reports a history of vertigo, but he denies any issues with this at this time. The patient may have some occasional nausea. The patient has noted that he is shuffling his feet, and he has difficulty with turns, and he has better balance with walking when he is going straight. The patient has fallen on occasion, usually inside the house when he is not using his cane. The memory has also been failing slightly over the last several years. The patient benign prostate enlargement, and he is on Flomax for this. The patient has had some occasional urinary incontinence without sensation of the bladder. The patient has had some neck and low back discomfort, and he has some numbness into the left hand. The patient has restless leg syndrome in both legs that may wake him up at night with jerking and some tingly sensations going down both legs. The patient does have some right shoulder discomfort. He has had bilateral total hip replacements in the past. The patient has been seen by Dr. Carloyn Manner, and some concerns for normal pressure hydrocephalus have come up, and the patient is referred to this office for further evaluation. The actual scan is not available to me on disc.  Past Medical History  Diagnosis Date    . Emphysema   . CAD (coronary artery disease)     left main had  a 20% stenosis.  Left anterior descending had 40-50% proximal   stenosis.  Left circumflex had an 80% proximal stenosis.  The  first obtuse marginal artery had a 60% mid stenosis.  The  right  coronary artery  had  a 50-60% proximal stenosis with a 60-70%  mid stenosis  The patient was managed medically.   Marland Kitchen COPD (chronic obstructive pulmonary disease)   . Hypothyroidism   . Anemia   . Blood transfusion june 2011, 5 units given    july 2011 some units given  . Enlarged prostate     elevated psa recently  . GERD (gastroesophageal reflux disease)   . Arthritis   . Complication of anesthesia     likes spinal due to copd  . Dizziness     occasional  . Shortness of breath     with exertion  . Mass, scrotum     L scrotum - Sees Dr. Jeffie Pollock  . Abnormality of gait 06/07/2013  . Pituitary macroadenoma   . Restless legs syndrome (RLS)     Past Surgical History  Procedure Laterality Date  . Coronary stent placement  5 yrs ago  . Hiatal hernia repair  01-04-2008  . Foot surgery  1994 left, 2002 right foot    bilateral foot reconstruciton  . Abdominal hernia repair   2008  . Cataract extraction both eyes    . Total hip  arthroplasty  07/21/2011    Procedure: TOTAL HIP ARTHROPLASTY ANTERIOR APPROACH;  Surgeon: Mauri Pole, MD;  Location: [facility];  Service: Orthopedics;  Laterality: Left;  . Coronary angioplasty    . Total hip arthroplasty Right 09/07/2012    Procedure: RIGHT TOTAL HIP ARTHROPLASTY ANTERIOR APPROACH;  Surgeon: Mcarthur Rossetti, MD;  Location: [facility];  Service: Orthopedics;  Laterality: Right;  . Foot surgery      reconstruction of both feet  . Esophagogastroduodenoscopy N/A 04/20/2013    Procedure: ESOPHAGOGASTRODUODENOSCOPY (EGD);  Surgeon: Inda Castle, MD;  Location: [facility];  Service: Endoscopy;  Laterality: N/A;    Family History  Problem Relation Age of Onset  . Heart disease Sister   .  Heart failure Father   . Colon cancer Neg Hx     Social history:  reports that he has never smoked. He has never used smokeless tobacco. He reports that he does not drink alcohol or use illicit drugs.  Medications:  Current Outpatient Prescriptions on File Prior to Visit  Medication Sig Dispense Refill  . albuterol (PROVENTIL HFA;VENTOLIN HFA) 108 (90 BASE) MCG/ACT inhaler Inhale 2 puffs into the lungs every 6 (six) hours as needed for wheezing or shortness of breath.      . bromocriptine (PARLODEL) 2.5 MG tablet TAKE ONE TABLET AT BEDTIME  30 tablet  3  . budesonide-formoterol (SYMBICORT) 160-4.5 MCG/ACT inhaler Inhale 2 puffs into the lungs 2 (two) times daily.      . cholecalciferol (VITAMIN D) 1000 UNITS tablet Take 1,000 Units by mouth 2 (two) times daily.      . ferrous sulfate 325 (65 FE) MG tablet Take 325 mg by mouth daily.       Marland Kitchen guaiFENesin (MUCINEX) 600 MG 12 hr tablet Take 600 mg by mouth 2 (two) times daily.       Marland Kitchen levothyroxine (SYNTHROID, LEVOTHROID) 50 MCG tablet Take 50 mcg by mouth daily before breakfast.      . magnesium oxide (MAG-OX) 400 MG tablet Take 400 mg by mouth daily.      . montelukast (SINGULAIR) 10 MG tablet TAKE 1 TABLET DAILY  30 tablet  3  . nitroGLYCERIN (NITROSTAT) 0.4 MG SL tablet Place 0.4 mg under the tongue every 5 (five) minutes as needed for chest pain.      Derrill Memo ON 06/21/2013] pantoprazole (PROTONIX) 40 MG tablet Take 1 tablet (40 mg total) by mouth daily.  30 tablet  1  . polyethylene glycol (MIRALAX / GLYCOLAX) packet Take 17 g by mouth daily.  14 each  0  . tamsulosin (FLOMAX) 0.4 MG CAPS capsule Take 1 capsule (0.4 mg total) by mouth daily.  30 capsule  6  . tetrahydrozoline (VISINE) 0.05 % ophthalmic solution Place 1 drop into both eyes as needed (For dry eyes).      . traMADol (ULTRAM) 50 MG tablet Take 50 mg by mouth every 6 (six) hours as needed for pain.      . vitamin E (VITAMIN E) 400 UNIT capsule Take 400 Units by mouth daily.         No current facility-administered medications on file prior to visit.      Allergies  Allergen Reactions  . Lortab [Hydrocodone-Acetaminophen] Nausea And Vomiting  . Penicillins Other (See Comments)    Light headed  . Zetia [Ezetimibe] Other (See Comments)    Muscle weakness  . Zocor [Simvastatin] Nausea Only and Other (See Comments)    muscle weakness  ROS:  Out of a complete 14 system review of symptoms, the patient complains only of the following symptoms, and all other reviewed systems are negative.  Fatigue Swelling in the legs Ringing in the ears, stinging sensations Shortness of breath, cough, wheezing, snoring Constipation Urination problems, incontinence Anemia, easy bruising, easy bleeding Joint pain, joint swelling, muscle cramps Memory loss, confusion, numbness, weakness, dizziness Depression, anxiety, decreased energy Snoring, restless legs  Blood pressure 121/73, pulse 67, height 5' 6.5" (1.689 m), weight 174 lb (78.926 kg).  Physical Exam  General: The patient is alert and cooperative at the time of the examination.  Eyes: Pupils are equal, round, and reactive to light. Discs are flat bilaterally.  Neck: The neck is supple, no carotid bruits are noted.  Respiratory: The respiratory examination is clear.  Cardiovascular: The cardiovascular examination reveals a regular rate and rhythm, no obvious murmurs or rubs are noted.  Skin: Extremities are without significant edema.  Neurologic Exam  Mental status: The Mini-Mental status examination done today shows a total score 29/30.  Cranial nerves: Facial symmetry is present. There is good sensation of the face to pinprick and soft touch bilaterally. The strength of the facial muscles and the muscles to head turning and shoulder shrug are normal bilaterally. Speech is well enunciated, no aphasia or dysarthria is noted. Extraocular movements are full. Visual fields are full. The tongue is midline, and  the patient has symmetric elevation of the soft palate. No obvious hearing deficits are noted.  Motor: The motor testing reveals 5 over 5 strength of all 4 extremities. Good symmetric motor tone is noted throughout.  Sensory: Sensory testing is intact to pinprick, soft touch, vibration sensation, and position sense on all 4 extremities. No definite stocking pattern pinprick sensory deficit is noted on the right, present on the left two thirds the way up the leg below the knee. No evidence of extinction is noted.  Coordination: Cerebellar testing reveals good finger-nose-finger and heel-to-shin bilaterally.  Gait and station: Gait is associated with short shuffling steps. Tandem gait is unsteady, but the patient is able to perform this. Romberg is negative. No drift is seen. A timed walking test with six 20 foot laps was performed in 56 seconds.  Reflexes: Deep tendon reflexes are symmetric, but are brisk bilaterally. Reflexes in the lower extremities are much more brisk than the arms, with crossed adductor reflexes seen. Toes are downgoing bilaterally.   Assessment/Plan:  One. Gait disorder  2. Mild memory disorder  3. Urinary incontinence  The patient appears to have a gait disorder associated with very brisk reflexes on all fours, particularly the lower extremities. The patient will need to be evaluated for a cervical myelopathy, and MRI of the cervical spine will be done. I will try to obtain the discs of the CT and MRI brain evaluations that have been done previously and reviewed these studies. If no spinal cord compression is noted, the patient may be considered for lumbar puncture and he will be reevaluated for any benefit from the spinal fluid removal. The patient will followup through this office in the future at some point. Blood work will be done today.  Jill Alexanders MD 06/08/2013 1:23 PM  [facility] [address] [place], [place]  [address]  Phone [phone] Fax [phone]

## 2013-06-07 NOTE — Patient Instructions (Signed)

## 2013-06-08 ENCOUNTER — Encounter: Payer: Self-pay | Admitting: Family Medicine

## 2013-06-10 ENCOUNTER — Telehealth: Payer: Self-pay | Admitting: Neurology

## 2013-06-10 NOTE — Telephone Encounter (Signed)
I called the patient, left a message, and then called the wife. The methylmalonic acid level is elevated, will have the patient come in for a vitamin B12 injection, and then he is to go to the oral tablets, 1000 mcg daily.

## 2013-06-11 ENCOUNTER — Telehealth: Payer: Self-pay | Admitting: Neurology

## 2013-06-11 NOTE — Telephone Encounter (Signed)
Patient calling to state that Dr. Jannifer Franklin said that a nurse would call him to get scheduled for a B12 shot and he was following up on this. Please call and advise patient.

## 2013-06-11 NOTE — Telephone Encounter (Signed)
Spoke to patient and he will come on 06-13-13 for B12 injection.

## 2013-06-11 NOTE — Telephone Encounter (Signed)
I have received the disc for the MRI of the brain. This does show some mild cortical atrophy, and some enlargement of the lateral ventricles and rounding of the ventricles, possibly a mild increase over the last 2 years. We will check MRI evaluation of the cervical spine, and if this is unremarkable, consider lumbar puncture evaluation.

## 2013-06-11 NOTE — Telephone Encounter (Signed)
Gave information to Dr. Tobey Grim nurse Butch Penny, RN

## 2013-06-12 LAB — RPR: RPR: NONREACTIVE

## 2013-06-12 LAB — COPPER, SERUM: Copper: 96 ug/dL (ref 72–166)

## 2013-06-12 LAB — METHYLMALONIC ACID, SERUM: Methylmalonic Acid: 576 nmol/L — ABNORMAL HIGH (ref 0–378)

## 2013-06-13 ENCOUNTER — Ambulatory Visit (INDEPENDENT_AMBULATORY_CARE_PROVIDER_SITE_OTHER): Payer: Medicare Other | Admitting: Neurology

## 2013-06-13 DIAGNOSIS — E538 Deficiency of other specified B group vitamins: Secondary | ICD-10-CM

## 2013-06-13 MED ORDER — CYANOCOBALAMIN 1000 MCG/ML IJ SOLN: 1000.0000 ug | Freq: Once | INTRAMUSCULAR | Status: DC

## 2013-06-13 NOTE — Progress Notes (Signed)
Patient here for B12 injection.  Under aseptic technique Cyanocobalamin 1067mcg/1ml given IM in right deltoid.  Tolerated well.  Band-Aid applied.

## 2013-06-13 NOTE — Patient Instructions (Signed)
Patient will start on oral B12 1016mcg.

## 2013-06-15 ENCOUNTER — Ambulatory Visit
Admission: RE | Admit: 2013-06-15 | Discharge: 2013-06-15 | Disposition: A | Discharge status: Home / Self Care | Payer: Medicare Other | Source: Ambulatory Visit | Attending: Neurology | Admitting: Neurology

## 2013-06-15 DIAGNOSIS — R269 Unspecified abnormalities of gait and mobility: Secondary | ICD-10-CM

## 2013-06-15 DIAGNOSIS — D352 Benign neoplasm of pituitary gland: Secondary | ICD-10-CM

## 2013-06-15 DIAGNOSIS — D353 Benign neoplasm of craniopharyngeal duct: Principal | ICD-10-CM

## 2013-06-17 ENCOUNTER — Telehealth: Payer: Self-pay | Admitting: Neurology

## 2013-06-17 DIAGNOSIS — G912 (Idiopathic) normal pressure hydrocephalus: Secondary | ICD-10-CM

## 2013-06-17 DIAGNOSIS — R269 Unspecified abnormalities of gait and mobility: Secondary | ICD-10-CM

## 2013-06-17 NOTE — Telephone Encounter (Signed)
I called the patient. The MRI the cervical spine does not show evidence of spinal cord compression. I discussed the possibility of performing lumbar puncture and reevaluated walking afterwards. I'll try to get this set up to eliminate the possibility of normal pressure hydrocephalus.   MRI cervical spine 06/17/2013:  IMPRESSION: Abnormal MR scan cervical spine showing prominent changes of spondylosis with disc osteophyte protrusions throughout but without significant compression.

## 2013-06-18 ENCOUNTER — Telehealth: Payer: Self-pay | Admitting: *Deleted

## 2013-06-18 NOTE — Telephone Encounter (Signed)
Patient was informed that he is to call our office and let us know when lp is scheduled. Dr. Jannifer Franklin needs to schedule quick visit same day.

## 2013-06-20 ENCOUNTER — Encounter: Payer: Self-pay | Admitting: Neurology

## 2013-06-20 ENCOUNTER — Other Ambulatory Visit: Payer: Self-pay | Admitting: Neurology

## 2013-06-20 DIAGNOSIS — G932 Benign intracranial hypertension: Secondary | ICD-10-CM

## 2013-06-20 NOTE — Telephone Encounter (Signed)
Patient calling back to state that his LP at [facility] has been scheduled for 07/25/13 at 10:15 AM.

## 2013-06-20 NOTE — Telephone Encounter (Signed)
I see that appt is 06-25-13 at 1030.  Needs appt  after this LP to evaluate gait. (same day after he has done).

## 2013-06-25 ENCOUNTER — Encounter: Payer: Self-pay | Admitting: Neurology

## 2013-06-25 ENCOUNTER — Other Ambulatory Visit: Payer: Self-pay | Admitting: Neurology

## 2013-06-25 ENCOUNTER — Ambulatory Visit (INDEPENDENT_AMBULATORY_CARE_PROVIDER_SITE_OTHER): Payer: Medicare Other | Admitting: Neurology

## 2013-06-25 ENCOUNTER — Ambulatory Visit
Admission: RE | Admit: 2013-06-25 | Discharge: 2013-06-25 | Disposition: A | Discharge status: Home / Self Care | Payer: Medicare Other | Source: Ambulatory Visit | Attending: Neurology | Admitting: Neurology

## 2013-06-25 VITALS — BP 141/88 | HR 78 | Wt 174.0 lb

## 2013-06-25 VITALS — BP 126/72 | HR 66

## 2013-06-25 DIAGNOSIS — D352 Benign neoplasm of pituitary gland: Secondary | ICD-10-CM

## 2013-06-25 DIAGNOSIS — D353 Benign neoplasm of craniopharyngeal duct: Secondary | ICD-10-CM

## 2013-06-25 DIAGNOSIS — R269 Unspecified abnormalities of gait and mobility: Secondary | ICD-10-CM

## 2013-06-25 DIAGNOSIS — G932 Benign intracranial hypertension: Secondary | ICD-10-CM

## 2013-06-25 NOTE — Discharge Instructions (Signed)
Lumbar Puncture Discharge Instructions ° °1. Go home and rest quietly for the next 24 hours.  It is important to lie flat for the next 24 hours.  Get up only to go to the restroom.  You may lie in the bed or on a couch on your back, your stomach, your left side or your right side.  You may have one pillow under your head.  You may have pillows between your knees while you are on your side or under your knees while you are on your back. ° °2. DO NOT drive today.  Recline the seat as far back as it will go, while still wearing your seat belt, on the way home. ° °3. You may get up to go to the bathroom as needed.  You may sit up for 10 minutes to eat.  You may resume your normal diet and medications unless otherwise indicated.  Drink lots of extra fluids today and tomorrow. ° °4. The incidence of headache, nausea, or vomiting is about 5% (one in 20 patients).  If you develop a headache, lie flat and drink plenty of fluids until the headache goes away.  Caffeinated beverages may be helpful.  If you develop severe nausea and vomiting or a headache that does not go away with flat bed rest, call [phone]. ° °5. You may resume normal activities after your 24 hours of bed rest is over; however, do not exert yourself strongly or do any heavy lifting tomorrow. ° °6. Call your physician for a follow-up appointment.  ° °7. If you have any questions or if complications develop after you arrive home, please call [phone]. ° °Discharge instructions have been explained to the patient.  The patient, or the person responsible for the patient, fully understands these instructions. °

## 2013-06-25 NOTE — Progress Notes (Signed)
Reason for visit: Gait disorder  [name] is an 80 y.o. male  History of present illness:  [name] is a 80 year old right-handed white male with a history of a gait disorder that has been present for the last 4 or 5 years. The patient has been noted to have pituitary tumor, but also has some ventriculomegaly. The patient reports a history of several concussions that have occurred in the past. The patient has undergone a lumbar puncture today, and he returns following this evaluation. The opening pressure was 11 cm. Spinal fluid analysis is pending. The patient comes in for reevaluation following the lumbar puncture. The patient was found to have a low normal vitamin B12 level, but the methylmalonic acid level was elevated. The patient was given a B12 injection, and he is now on oral B12 supplementation. The patient indicates that he feels better following this.  Past Medical History  Diagnosis Date  . Emphysema   . CAD (coronary artery disease)     left main had  a 20% stenosis.  Left anterior descending had 40-50% proximal   stenosis.  Left circumflex had an 80% proximal stenosis.  The  first obtuse marginal artery had a 60% mid stenosis.  The  right  coronary artery  had  a 50-60% proximal stenosis with a 60-70%  mid stenosis  The patient was managed medically.   Marland Kitchen COPD (chronic obstructive pulmonary disease)   . Hypothyroidism   . Anemia   . Blood transfusion june 2011, 5 units given    july 2011 some units given  . Enlarged prostate     elevated psa recently  . GERD (gastroesophageal reflux disease)   . Arthritis   . Complication of anesthesia     likes spinal due to copd  . Dizziness     occasional  . Shortness of breath     with exertion  . Mass, scrotum     L scrotum - Sees Dr. Jeffie Pollock  . Abnormality of gait 06/07/2013  . Pituitary macroadenoma   . Restless legs syndrome (RLS)     Past Surgical History  Procedure Laterality Date  . Coronary stent placement   5 yrs ago  . Hiatal hernia repair  01-04-2008  . Foot surgery  1994 left, 2002 right foot    bilateral foot reconstruciton  . Abdominal hernia repair   2008  . Cataract extraction both eyes    . Total hip arthroplasty  07/21/2011    Procedure: TOTAL HIP ARTHROPLASTY ANTERIOR APPROACH;  Surgeon: Mauri Pole, MD;  Location: [facility];  Service: Orthopedics;  Laterality: Left;  . Coronary angioplasty    . Total hip arthroplasty Right 09/07/2012    Procedure: RIGHT TOTAL HIP ARTHROPLASTY ANTERIOR APPROACH;  Surgeon: Mcarthur Rossetti, MD;  Location: [facility];  Service: Orthopedics;  Laterality: Right;  . Foot surgery      reconstruction of both feet  . Esophagogastroduodenoscopy N/A 04/20/2013    Procedure: ESOPHAGOGASTRODUODENOSCOPY (EGD);  Surgeon: Inda Castle, MD;  Location: [facility];  Service: Endoscopy;  Laterality: N/A;    Family History  Problem Relation Age of Onset  . Heart disease Sister   . Heart failure Father   . Colon cancer Neg Hx     Social history:  reports that he has never smoked. He has never used smokeless tobacco. He reports that he does not drink alcohol or use illicit drugs.    Allergies  Allergen Reactions  . Lortab [Hydrocodone-Acetaminophen]  Nausea And Vomiting  . Penicillins Other (See Comments)    Light headed  . Zetia [Ezetimibe] Other (See Comments)    Muscle weakness  . Zocor [Simvastatin] Nausea Only and Other (See Comments)    muscle weakness    Medications:  Current Outpatient Prescriptions on File Prior to Visit  Medication Sig Dispense Refill  . albuterol (PROVENTIL HFA;VENTOLIN HFA) 108 (90 BASE) MCG/ACT inhaler Inhale 2 puffs into the lungs every 6 (six) hours as needed for wheezing or shortness of breath.      . bromocriptine (PARLODEL) 2.5 MG tablet TAKE ONE TABLET AT BEDTIME  30 tablet  3  . budesonide-formoterol (SYMBICORT) 160-4.5 MCG/ACT inhaler Inhale 2 puffs into the lungs 2 (two) times daily.      . cholecalciferol  (VITAMIN D) 1000 UNITS tablet Take 1,000 Units by mouth 2 (two) times daily.      . ferrous sulfate 325 (65 FE) MG tablet Take 325 mg by mouth daily.       Marland Kitchen guaiFENesin (MUCINEX) 600 MG 12 hr tablet Take 600 mg by mouth 2 (two) times daily.       Marland Kitchen levothyroxine (SYNTHROID, LEVOTHROID) 50 MCG tablet Take 50 mcg by mouth daily before breakfast.      . magnesium oxide (MAG-OX) 400 MG tablet Take 400 mg by mouth daily.      . montelukast (SINGULAIR) 10 MG tablet TAKE 1 TABLET DAILY  30 tablet  3  . nitroGLYCERIN (NITROSTAT) 0.4 MG SL tablet Place 0.4 mg under the tongue every 5 (five) minutes as needed for chest pain.      . pantoprazole (PROTONIX) 40 MG tablet Take 1 tablet (40 mg total) by mouth daily.  30 tablet  1  . polyethylene glycol (MIRALAX / GLYCOLAX) packet Take 17 g by mouth daily.  14 each  0  . tamsulosin (FLOMAX) 0.4 MG CAPS capsule Take 1 capsule (0.4 mg total) by mouth daily.  30 capsule  6  . tetrahydrozoline (VISINE) 0.05 % ophthalmic solution Place 1 drop into both eyes as needed (For dry eyes).      . traMADol (ULTRAM) 50 MG tablet Take 50 mg by mouth every 6 (six) hours as needed for pain.      . vitamin B-12 (CYANOCOBALAMIN) 1000 MCG tablet Take 1,000 mcg by mouth daily.      . vitamin E (VITAMIN E) 400 UNIT capsule Take 400 Units by mouth daily.       Current Facility-Administered Medications on File Prior to Visit  Medication Dose Route Frequency Provider Last Rate Last Dose  . cyanocobalamin ((VITAMIN B-12)) injection 1,000 mcg  1,000 mcg Intramuscular Once Kathrynn Ducking, MD        ROS:  Out of a complete 14 system review of symptoms, the patient complains only of the following symptoms, and all other reviewed systems are negative.  Gait disorder  Blood pressure 141/88, pulse 78, weight 174 lb (78.926 kg).  Physical Exam  General: The patient is alert and cooperative at the time of the examination.  Skin: No significant peripheral edema is  noted.   Neurologic Exam  Mental status: The patient is oriented x 3. Mini-Mental status examination done today shows a total score of 30/30.  Cranial nerves: Facial symmetry is present. Speech reveals no aphasia or dysarthria. Speech appears to be slightly dysphonic. Extraocular movements are full. Visual fields are full. Lid lag is noted. Good animation of the face is seen.  Motor: The patient has good  strength in all 4 extremities.  Sensory examination: Soft touch sensation is symmetric on the face, arms, legs.  Coordination: The patient has good finger-nose-finger and heel-to-shin bilaterally.  Gait and station: The patient has a shuffling type gait, the patient is slightly stooped. The patient has arm swing bilaterally, but is somewhat decreased bilaterally. A timed walk test of 20 feet, 6 laps, was performed in 45 seconds.  Reflexes: Deep tendon reflexes are symmetric.   Assessment/Plan:  1. Gait disorder, possible normal pressure hydrocephalus  The patient does have some features of parkinsonism on examination. The patient has good facial animation, but he does have bilateral lid lag, mild dysphonia, and slight decrease in arm swing with a shuffling gait. The patient does have a stooped posture. However, the patient has undergone a lumbar puncture, and it appears that he has improved with his ability to ambulate. The patient went from taking 56 seconds to walk 6 laps of 20 feet to taking 45 seconds. The Mini-Mental status examination and 29/30 to 30/30. The patient will contact me after a 3 or 4 days to go over his progress. We may consider a referral back to Dr. Carloyn Manner in the future.  Jill Alexanders MD 06/25/2013 6:48 PM  [facility] [address] [place], [place] [address]  Phone [phone] Fax [phone]

## 2013-06-25 NOTE — Patient Instructions (Signed)

## 2013-06-25 NOTE — Progress Notes (Signed)
Discharge instructions explained to pt. Blood drawn to be sent with spinal fluid from right [facility], site unremarkable and pt tolerated well.

## 2013-06-26 ENCOUNTER — Encounter: Payer: Self-pay | Admitting: Neurology

## 2013-06-27 ENCOUNTER — Encounter: Payer: Self-pay | Admitting: Neurology

## 2013-06-27 LAB — CSF PANEL 1
Glucose, CSF: 51 mg/dL (ref 43–76)
RBC Count, CSF: 0 cu mm
Total Protein, CSF: 43 mg/dL (ref 15–45)
Tube #: 4
VDRL Quant, CSF: NONREACTIVE
WBC, CSF: 0 cu mm (ref 0–5)

## 2013-06-27 LAB — ANGIOTENSIN CONVERTING ENZYME, CSF: ACE, CSF: 4 U/L (ref <=15)

## 2013-06-28 ENCOUNTER — Telehealth: Payer: Self-pay | Admitting: Neurology

## 2013-06-28 DIAGNOSIS — G912 (Idiopathic) normal pressure hydrocephalus: Secondary | ICD-10-CM

## 2013-06-28 NOTE — Telephone Encounter (Signed)
I called patient. He indicates that he seemed to improve initially with the lumbar puncture, but within the last day or 2, his ability to ambulate has declined. It appears that the lumbar puncture did improve his functional ability. We will consider a neurosurgical referral. I am awaiting the discs for the scans that were done previously. [name] indicates that Dr. Carloyn Manner does not perform the VP shunt procedures.

## 2013-07-01 ENCOUNTER — Encounter: Payer: Self-pay | Admitting: Neurology

## 2013-07-03 ENCOUNTER — Telehealth: Payer: Self-pay | Admitting: Neurology

## 2013-07-03 ENCOUNTER — Encounter: Payer: Self-pay | Admitting: Neurology

## 2013-07-03 LAB — B. BURGDORFI ANTIBODIES, CSF: Lyme Ab: NEGATIVE

## 2013-07-03 NOTE — Telephone Encounter (Signed)
I called patient. I have reviewed the MRI the brain, and I do not believe that the findings are consistent with normal pressure hydrocephalus. The patient does have some atrophy, and slight enlargement of the ventricles associated with this, but the ventricular size is consistent with the degree of atrophy. On clinical examination, there have been some features of parkinsonism, apply to follow the patient for several months before pursuing a neurosurgical consultation. We will call and cancel the neurosurgery referral.

## 2013-07-04 ENCOUNTER — Encounter: Payer: Self-pay | Admitting: Family Medicine

## 2013-07-04 ENCOUNTER — Encounter: Payer: Self-pay | Admitting: Neurology

## 2013-07-04 ENCOUNTER — Ambulatory Visit (INDEPENDENT_AMBULATORY_CARE_PROVIDER_SITE_OTHER): Payer: Medicare Other | Admitting: Family Medicine

## 2013-07-04 VITALS — BP 113/72 | HR 71 | Temp 97.6°F | Ht 67.0 in | Wt 176.0 lb

## 2013-07-04 DIAGNOSIS — Z8639 Personal history of other endocrine, nutritional and metabolic disease: Secondary | ICD-10-CM

## 2013-07-04 DIAGNOSIS — Z862 Personal history of diseases of the blood and blood-forming organs and certain disorders involving the immune mechanism: Secondary | ICD-10-CM

## 2013-07-04 DIAGNOSIS — Z87898 Personal history of other specified conditions: Secondary | ICD-10-CM

## 2013-07-04 DIAGNOSIS — Z8719 Personal history of other diseases of the digestive system: Secondary | ICD-10-CM

## 2013-07-04 DIAGNOSIS — G912 (Idiopathic) normal pressure hydrocephalus: Secondary | ICD-10-CM

## 2013-07-04 DIAGNOSIS — D649 Anemia, unspecified: Secondary | ICD-10-CM

## 2013-07-04 LAB — POCT CBC
Granulocyte percent: 72.4 %G (ref 37–80)
HCT, POC: 40.9 % — AB (ref 43.5–53.7)
Hemoglobin: 13.2 g/dL — AB (ref 14.1–18.1)
Lymph, poc: 1.2 (ref 0.6–3.4)
MCH, POC: 29.8 pg (ref 27–31.2)
MCHC: 32.3 g/dL (ref 31.8–35.4)
MCV: 92.3 fL (ref 80–97)
MPV: 7.6 fL (ref 0–99.8)
POC Granulocyte: 5.6 (ref 2–6.9)
POC LYMPH PERCENT: 15.6 %L (ref 10–50)
Platelet Count, POC: 216 10*3/uL (ref 142–424)
RBC: 4.4 M/uL — AB (ref 4.69–6.13)
RDW, POC: 15.2 %
WBC: 7.7 10*3/uL (ref 4.6–10.2)

## 2013-07-04 NOTE — Progress Notes (Signed)
Subjective:    Patient ID: [name], male    DOB: Feb 04, 1933, 80 y.o.   MRN: [id number]  HPI Patient here today for 5 week recheck of SOB and anemia. He is accompanied today by his wife. The patient is doing well. He has more energy and is less short of breath. His PSA today is 95%. The patient is in good spirits. He has been followed by Dr. Calvert Cantor for a pituitary tumor and is taking bromocriptine for this. He was subsequently referred to Dr. Jannifer Franklin thinking that he might have normal pressure hydrocephalus. This condition has continued to be followed by Dr. Jannifer Franklin. He may need a shot but that decision has not been made at this time. He also has borderline low B12 levels and is taking the 12 pills over-the-counter.        Patient Active Problem List   Diagnosis Date Noted  . Abnormality of gait 06/07/2013  . Esophageal ulcer with bleeding 04/20/2013  . Acute respiratory failure 04/18/2013  . S/P left and ritght THA, AA 07/21/2011  . Special screening for malignant neoplasms, colon 01/03/2011  . Personal history of colonic polyps 01/03/2011  . COPD GOLD I 08/24/2010  . GI BLEED 11/17/2009  . ESOPHAGEAL REFLUX 08/25/2009  . BLOOD IN STOOL-MELENA 08/25/2009  . COLONIC POLYPS 07/17/2008  . ANEMIA 07/17/2008  . HYPOTENSION 07/17/2008  . ESOPHAGITIS 07/17/2008  . DIVERTICULOSIS, MILD 07/17/2008  . DEGENERATIVE JOINT DISEASE 07/17/2008  . OSTEOPOROSIS 07/17/2008  . BENIGN PROSTATIC HYPERTROPHY, HX OF 07/17/2008  . HYPERPROLACTINEMIA 10/24/2007  . PITUITARY ADENOMA 06/25/2007  . HYPOTHYROIDISM 06/25/2007  . HYPERLIPIDEMIA 06/22/2007  . CORONARY ARTERY DISEASE 06/22/2007   Outpatient Encounter Prescriptions as of 07/04/2013  Medication Sig  . albuterol (PROVENTIL HFA;VENTOLIN HFA) 108 (90 BASE) MCG/ACT inhaler Inhale 2 puffs into the lungs every 6 (six) hours as needed for wheezing or shortness of breath.  . bromocriptine (PARLODEL) 2.5 MG tablet TAKE ONE TABLET AT BEDTIME  .  budesonide-formoterol (SYMBICORT) 160-4.5 MCG/ACT inhaler Inhale 2 puffs into the lungs 2 (two) times daily.  . cholecalciferol (VITAMIN D) 1000 UNITS tablet Take 1,000 Units by mouth 2 (two) times daily.  . ferrous sulfate 325 (65 FE) MG tablet Take 325 mg by mouth daily.   Marland Kitchen guaiFENesin (MUCINEX) 600 MG 12 hr tablet Take 600 mg by mouth 2 (two) times daily.   Marland Kitchen levothyroxine (SYNTHROID, LEVOTHROID) 50 MCG tablet Take 50 mcg by mouth daily before breakfast.  . magnesium oxide (MAG-OX) 400 MG tablet Take 400 mg by mouth daily.  . montelukast (SINGULAIR) 10 MG tablet TAKE 1 TABLET DAILY  . nitroGLYCERIN (NITROSTAT) 0.4 MG SL tablet Place 0.4 mg under the tongue every 5 (five) minutes as needed for chest pain.  . pantoprazole (PROTONIX) 40 MG tablet Take 1 tablet (40 mg total) by mouth daily.  . polyethylene glycol (MIRALAX / GLYCOLAX) packet Take 17 g by mouth daily.  . tamsulosin (FLOMAX) 0.4 MG CAPS capsule Take 1 capsule (0.4 mg total) by mouth daily.  Marland Kitchen tetrahydrozoline (VISINE) 0.05 % ophthalmic solution Place 1 drop into both eyes as needed (For dry eyes).  . traMADol (ULTRAM) 50 MG tablet Take 50 mg by mouth every 6 (six) hours as needed for pain.  . vitamin B-12 (CYANOCOBALAMIN) 1000 MCG tablet Take 1,000 mcg by mouth daily.  . vitamin E (VITAMIN E) 400 UNIT capsule Take 400 Units by mouth daily.    Review of Systems  Constitutional: Negative.  Fatigue: more energy.  HENT: Negative.   Eyes: Negative.   Respiratory: Negative.  Shortness of breath: better.   Cardiovascular: Negative.   Gastrointestinal: Negative.   Endocrine: Negative.   Genitourinary: Negative.   Musculoskeletal: Negative.   Skin: Negative.   Allergic/Immunologic: Negative.   Neurological: Negative.   Hematological: Negative.   Psychiatric/Behavioral: Negative.        Objective:   Physical Exam  Nursing note and vitals reviewed. Constitutional: He is oriented to person, place, and time. He appears  well-developed and well-nourished. No distress.  HENT:  Head: Normocephalic and atraumatic.  Right Ear: External ear normal.  Left Ear: External ear normal.  Nose: Nose normal.  Mouth/Throat: Oropharynx is clear and moist. No oropharyngeal exudate.  Eyes: Conjunctivae and EOM are normal. Pupils are equal, round, and reactive to light. Right eye exhibits no discharge. Left eye exhibits no discharge. No scleral icterus.  Neck: Normal range of motion. Neck supple. No thyromegaly present.  No carotid bruits  Cardiovascular: Normal rate, regular rhythm, normal heart sounds and intact distal pulses.  Exam reveals no gallop and no friction rub.   No murmur heard. At 72 per minute  Pulmonary/Chest: Effort normal and breath sounds normal. No respiratory distress. He has no wheezes. He has no rales. He exhibits no tenderness.  Abdominal: Soft. Bowel sounds are normal. He exhibits no mass. There is no tenderness. There is no rebound and no guarding.  Musculoskeletal: Normal range of motion. He exhibits no edema and no tenderness.  Lymphadenopathy:    He has no cervical adenopathy.  Neurological: He is alert and oriented to person, place, and time. He has normal reflexes. No cranial nerve deficit.  Skin: Skin is warm and dry. No rash noted.  Psychiatric: He has a normal mood and affect. His behavior is normal. Judgment and thought content normal.   BP 113/72  Pulse 71  Temp(Src) 97.6 F (36.4 C) (Oral)  Ht 5\' 7"  (1.702 m)  Wt 176 lb (79.833 kg)  BMI 27.56 kg/m2 Results for orders placed in visit on 07/04/13  POCT CBC      Result Value Ref Range   WBC 7.7  4.6 - 10.2 K/uL   Lymph, poc 1.2  0.6 - 3.4   POC LYMPH PERCENT 15.6  10 - 50 %L   POC Granulocyte 5.6  2 - 6.9   Granulocyte percent 72.4  37 - 80 %G   RBC 4.4 (*) 4.69 - 6.13 M/uL   Hemoglobin 13.2 (*) 14.1 - 18.1 g/dL   HCT, POC 40.9 (*) 43.5 - 53.7 %   MCV 92.3  80 - 97 fL   MCH, POC 29.8  27 - 31.2 pg   MCHC 32.3  31.8 - 35.4 g/dL    RDW, POC 15.2     Platelet Count, POC 216.0  142 - 424 K/uL   MPV 7.6  0 - 99.8 fL   The patient was made aware of the CBC results before he left the office      Assessment & Plan:  1. Anemia - POCT CBC  2. H/O: pituitary tumor  3. Normal pressure hydrocephalus -Continued followup with neurology  4. History of GI bleed Patient Instructions  Continue to exercise as much as possible. Do not climb Keep followup visit with Dr. Jannifer Franklin We will call you with the results of the lab work from today as soon as it is available.    Arrie Senate MD

## 2013-07-04 NOTE — Patient Instructions (Signed)
Continue to exercise as much as possible. Do not climb Keep followup visit with Dr. Jannifer Franklin We will call you with the results of the lab work from today as soon as it is available.

## 2013-07-15 ENCOUNTER — Encounter: Payer: Self-pay | Admitting: Endocrinology

## 2013-07-16 ENCOUNTER — Encounter: Payer: Self-pay | Admitting: Family Medicine

## 2013-07-17 ENCOUNTER — Encounter: Payer: Self-pay | Admitting: Internal Medicine

## 2013-07-19 ENCOUNTER — Encounter: Payer: Self-pay | Admitting: Internal Medicine

## 2013-07-19 ENCOUNTER — Other Ambulatory Visit: Payer: Self-pay | Admitting: Family Medicine

## 2013-07-19 ENCOUNTER — Ambulatory Visit (INDEPENDENT_AMBULATORY_CARE_PROVIDER_SITE_OTHER): Payer: Medicare Other | Admitting: Internal Medicine

## 2013-07-19 VITALS — BP 126/62 | HR 64 | Ht 67.0 in | Wt 176.0 lb

## 2013-07-19 DIAGNOSIS — K2210 Ulcer of esophagus without bleeding: Secondary | ICD-10-CM

## 2013-07-19 DIAGNOSIS — K219 Gastro-esophageal reflux disease without esophagitis: Secondary | ICD-10-CM

## 2013-07-19 DIAGNOSIS — K5900 Constipation, unspecified: Secondary | ICD-10-CM

## 2013-07-19 DIAGNOSIS — E538 Deficiency of other specified B group vitamins: Secondary | ICD-10-CM

## 2013-07-19 DIAGNOSIS — D649 Anemia, unspecified: Secondary | ICD-10-CM

## 2013-07-19 MED ORDER — PANTOPRAZOLE SODIUM 40 MG PO TBEC: 40.0000 mg | DELAYED_RELEASE_TABLET | Freq: Every day | ORAL | Status: DC

## 2013-07-19 MED ORDER — CYANOCOBALAMIN 500 MCG/0.1ML NA SOLN: 1.0000 ug | NASAL | Status: DC

## 2013-07-19 NOTE — Patient Instructions (Signed)
We have sent the following medications to your pharmacy for you to pick up at your convenience: take protonix daily, nascobal 1 spray in a nostral weekly. Continue taking oral iron  You can take famotidine 20 mg at night as needed for break though heartburn, this can be purchased over the counter Discontinue taking a stool softner  Follow up in 3 months

## 2013-07-19 NOTE — Progress Notes (Signed)
Patient ID: [name], male   DOB: 26-Dec-1932, 80 y.o.   MRN: [id number] HPI: [name] is a 80 yo male with PMH of esophageal ulcer seen on several occasions over the last several years with history of upper GI bleed, history of CAD, COPD, pituitary tumor on bromocriptine, and fairly recent diagnosis of normal pressure hydrocephalus who is seen in hospital followup. He is here today with his wife. He was previously followed by Dr. Verl Blalock prior to his retirement and this is his first visit with me. He was hospitalized in February 2015 with melena and acute anemia. Upper endoscopy was performed on 04/20/2013 by Dr. Deatra Ina which revealed a 3 cm hiatus hernia and the GE junction esophageal ulceration. The exam was otherwise normal. The patient has had a prior Nissen fundoplication approximately 2008. He recently had hemoglobin checked by his primary care and this was 13.2 on 07/04/2013. MCV 92.3. He has a history of upper GI bleed in May and June of 2011 and upper endoscopy was performed at that time by Dr. Leroy Kennedy and Dr. Ardis Hughs. The esophageal ulceration was present at that time and in May he had a visible vessel which was oozing requiring clipping. There was hypothesis that the ulcer might be secondary to bisphosphonate therapy. Bisphosphonate therapy has since been discontinued.  Recently he reports that he is feeling better. His energy level has improved. He has not seen black or tarry stools. His stools have remained dark as he is on iron replacement therapy. He does take pantoprazole 40 mg twice daily and he questions today whether or not this medication could be worsening history osteoporosis or put him at risk for infection. He denies breakthrough heartburn or reflux symptoms on this medication. He denies dysphagia or odynophagia. No weight loss.  Past Medical History  Diagnosis Date  . Emphysema   . CAD (coronary artery disease)     left main had  a 20% stenosis.  Left anterior  descending had 40-50% proximal   stenosis.  Left circumflex had an 80% proximal stenosis.  The  first obtuse marginal artery had a 60% mid stenosis.  The  right  coronary artery  had  a 50-60% proximal stenosis with a 60-70%  mid stenosis  The patient was managed medically.   Marland Kitchen COPD (chronic obstructive pulmonary disease)   . Hypothyroidism   . Anemia   . Blood transfusion june 2011, 5 units given    july 2011 some units given  . Enlarged prostate     elevated psa recently  . GERD (gastroesophageal reflux disease)   . Arthritis   . Complication of anesthesia     likes spinal due to copd  . Dizziness     occasional  . Shortness of breath     with exertion  . Mass, scrotum     L scrotum - Sees Dr. Jeffie Pollock  . Abnormality of gait 06/07/2013  . Pituitary macroadenoma   . Restless legs syndrome (RLS)     Past Surgical History  Procedure Laterality Date  . Coronary stent placement  5 yrs ago  . Hiatal hernia repair  01-04-2008  . Foot surgery  1994 left, 2002 right foot    bilateral foot reconstruciton  . Abdominal hernia repair   2008  . Cataract extraction both eyes    . Total hip arthroplasty  07/21/2011    Procedure: TOTAL HIP ARTHROPLASTY ANTERIOR APPROACH;  Surgeon: Mauri Pole, MD;  Location: [facility];  Service: Orthopedics;  Laterality: Left;  . Coronary angioplasty    . Total hip arthroplasty Right 09/07/2012    Procedure: RIGHT TOTAL HIP ARTHROPLASTY ANTERIOR APPROACH;  Surgeon: Mcarthur Rossetti, MD;  Location: [facility];  Service: Orthopedics;  Laterality: Right;  . Foot surgery      reconstruction of both feet  . Esophagogastroduodenoscopy N/A 04/20/2013    Procedure: ESOPHAGOGASTRODUODENOSCOPY (EGD);  Surgeon: Inda Castle, MD;  Location: [facility];  Service: Endoscopy;  Laterality: N/A;    Current Outpatient Prescriptions  Medication Sig Dispense Refill  . albuterol (PROVENTIL HFA;VENTOLIN HFA) 108 (90 BASE) MCG/ACT inhaler Inhale 2 puffs into the lungs every 6  (six) hours as needed for wheezing or shortness of breath.      . bromocriptine (PARLODEL) 2.5 MG tablet TAKE ONE TABLET AT BEDTIME  30 tablet  3  . budesonide-formoterol (SYMBICORT) 160-4.5 MCG/ACT inhaler Inhale 2 puffs into the lungs 2 (two) times daily.      . cholecalciferol (VITAMIN D) 1000 UNITS tablet Take 1,000 Units by mouth 2 (two) times daily.      . ferrous sulfate 325 (65 FE) MG tablet Take 325 mg by mouth daily.       Marland Kitchen guaiFENesin (MUCINEX) 600 MG 12 hr tablet Take 600 mg by mouth 2 (two) times daily.       Marland Kitchen levothyroxine (SYNTHROID, LEVOTHROID) 50 MCG tablet Take 50 mcg by mouth daily before breakfast.      . magnesium oxide (MAG-OX) 400 MG tablet Take 400 mg by mouth daily.      . montelukast (SINGULAIR) 10 MG tablet TAKE 1 TABLET DAILY  30 tablet  3  . nitroGLYCERIN (NITROSTAT) 0.4 MG SL tablet Place 0.4 mg under the tongue every 5 (five) minutes as needed for chest pain.      . pantoprazole (PROTONIX) 40 MG tablet Take 1 tablet (40 mg total) by mouth daily.  30 tablet  3  . polyethylene glycol (MIRALAX / GLYCOLAX) packet Take 17 g by mouth daily.  14 each  0  . tamsulosin (FLOMAX) 0.4 MG CAPS capsule Take 1 capsule (0.4 mg total) by mouth daily.  30 capsule  6  . tetrahydrozoline (VISINE) 0.05 % ophthalmic solution Place 1 drop into both eyes as needed (For dry eyes).      . traMADol (ULTRAM) 50 MG tablet Take 50 mg by mouth every 6 (six) hours as needed for pain.      . vitamin E (VITAMIN E) 400 UNIT capsule Take 400 Units by mouth daily.      . Cyanocobalamin (NASCOBAL) 500 MCG/0.1ML SOLN Place 0.01 mLs (50 mcg total) into the nose once a week.  1 Bottle  2   Current Facility-Administered Medications  Medication Dose Route Frequency Provider Last Rate Last Dose  . cyanocobalamin ((VITAMIN B-12)) injection 1,000 mcg  1,000 mcg Intramuscular Once Kathrynn Ducking, MD        Allergies  Allergen Reactions  . Lortab [Hydrocodone-Acetaminophen] Nausea And Vomiting  .  Penicillins Other (See Comments)    Light headed  . Zetia [Ezetimibe] Other (See Comments)    Muscle weakness  . Zocor [Simvastatin] Nausea Only and Other (See Comments)    muscle weakness    Family History  Problem Relation Age of Onset  . Heart disease Sister   . Heart failure Father   . Colon cancer Neg Hx     History  Substance Use Topics  . Smoking status: Never Smoker   . Smokeless tobacco: Never  Used  . Alcohol Use: No    ROS: As per history of present illness, otherwise negative  BP 126/62  Pulse 64  Ht 5\' 7"  (1.702 m)  Wt 176 lb (79.833 kg)  BMI 27.56 kg/m2 Constitutional: Well-developed and well-nourished. No distress. HEENT: Normocephalic and atraumatic. Oropharynx is clear and moist. No oropharyngeal exudate. Conjunctivae are normal.  No scleral icterus. Neck: Neck supple. Trachea midline. Cardiovascular: Normal rate, regular rhythm and intact distal pulses.  Pulmonary/chest: Effort normal and distant breath sounds normal. No wheezing, rales or rhonchi. Abdominal: Soft, nontender, nondistended. Bowel sounds active throughout.  Extremities: no clubbing, cyanosis, or edema Neurological: Alert and oriented to person place and time. Skin: Skin is warm and dry. No rashes noted. Psychiatric: Normal mood and affect. Behavior is normal.  RELEVANT LABS AND IMAGING: CBC    Component Value Date/Time   WBC 7.7 07/04/2013 1205   WBC 7.8 04/22/2013 0424   RBC 4.4* 07/04/2013 1205   RBC 2.65* 04/22/2013 0424   RBC 2.86* 04/19/2013 1015   HGB 13.2* 07/04/2013 1205   HGB 10.4* 04/23/2013 1004   HCT 40.9* 07/04/2013 1205   HCT 31.3* 04/23/2013 1004   PLT 180 04/22/2013 0424   MCV 92.3 07/04/2013 1205   MCV 94.3 04/22/2013 0424   MCH 29.8 07/04/2013 1205   MCH 30.6 04/22/2013 0424   MCHC 32.3 07/04/2013 1205   MCHC 32.4 04/22/2013 0424   RDW 17.1* 04/22/2013 0424   LYMPHSABS 0.9 04/18/2013 1421   MONOABS 1.2* 04/18/2013 1421   EOSABS 0.0 04/18/2013 1421   BASOSABS 0.1 04/18/2013 1421     CMP     Component Value Date/Time   NA 141 04/29/2013 1443   NA 141 04/21/2013 0406   K 4.3 04/29/2013 1443   CL 105 04/29/2013 1443   CO2 21 04/29/2013 1443   GLUCOSE 97 04/29/2013 1443   GLUCOSE 90 04/21/2013 0406   BUN 11 04/29/2013 1443   BUN 11 04/21/2013 0406   CREATININE 0.95 04/29/2013 1443   CALCIUM 8.9 04/29/2013 1443   PROT 6.3 04/29/2013 1443   PROT 6.4 09/20/2012 1500   ALBUMIN 3.0* 09/20/2012 1500   AST 20 04/29/2013 1443   ALT 10 04/29/2013 1443   ALKPHOS 98 04/29/2013 1443   BILITOT 0.3 04/29/2013 1443   GFRNONAA 76 04/29/2013 1443   GFRAA 88 04/29/2013 1443   Colonoscopy November 2012, Dr. Sharlett Iles -- diverticulosis without polyps. History of adenoma.  ASSESSMENT/PLAN: 80 yo male with PMH of esophageal ulcer seen on several occasions over the last several years with history of upper GI bleed, history of CAD, COPD, pituitary tumor on bromocriptine, and fairly recent diagnosis of normal pressure hydrocephalus who is seen in hospital followup.  1.  Distal esophageal ulceration/hx of GERD/hx of Nissen/upper GI bleeding with anemia -- fortunately he's had no further evidence of bleeding since hospitalization in February 2015. His hemoglobin has improved nicely with iron replacement therapy. My concern is that this ulcer is chronic and nonhealing and bleeds intermittently. He has been seen both in 2011 at EGD and recently in 2015.  There has been no evidence of malignancy by its appearance at any time which is reassuring. I question whether this ulcer could be chronic and nonhealing due to his history of Nissen fundoplication and possibly compromised blood flow to this area. I have discussed repeating the upper endoscopy with him to check for healing but he would like to hold off if at all possible. With this in mind I would  like to see him back in 3 months time to see how he is feeling, ensure he has had no further bleeding, monitor his blood counts, and rediscuss endoscopy. Regarding his PPI, I do feel  it is okay to reduce pantoprazole to 40 mg once daily. Given his chronic ulceration history of bleeding I'm hesitant to stop PPI altogether. He can use famotidine 20 mg in the evenings as needed for breakthrough heartburn symptoms. He has osteoporosis and we discussed PPIs and osteoporosis. He has been exposed to a bisphosphonates and has received what sounds like zoledronic acid so hopefully his fracture risk is as controlled as possible. He understands and agrees with this plan. He asked about possible revision of Nissen fundoplication, but understands that this would be a surgery and may be more involved. I would want to document truly that the ulcer is nonhealing for any consideration of surgery. We can discuss this again when he returns to clinic.   2.  B12 def -- I recommended IM B12 monthly rather than oral B12. Nasal B12 is also an option if not too expensive.  3.  Constipation -- we'll start MiraLax 17 g daily. He likely will be oh to stop stool softener once she is established on this therapy. Constipation likely exacerbated by oral iron.  4.  Anemia -- monitor hemoglobin closely and continue oral iron replacement

## 2013-07-21 ENCOUNTER — Encounter: Payer: Self-pay | Admitting: Internal Medicine

## 2013-07-21 ENCOUNTER — Encounter: Payer: Self-pay | Admitting: Family Medicine

## 2013-07-23 ENCOUNTER — Telehealth: Payer: Self-pay | Admitting: Gastroenterology

## 2013-07-23 NOTE — Telephone Encounter (Signed)
Message copied by Annabell Sabal on Tue Jul 23, 2013  8:53 AM ------      Message from: Jerene Bears      Created: Fri Jul 19, 2013  4:37 PM       pls let pt know and proceed with monthly IM B12      ----- Message -----         From: Annabell Sabal, CMA         Sent: 07/19/2013   2:21 PM           To: Jerene Bears, MD            Pt's insurance will not cover Nascobal, and looks like injection will be accepted.       Please advise      rgds       ------

## 2013-07-24 ENCOUNTER — Telehealth: Payer: Self-pay | Admitting: Gastroenterology

## 2013-07-24 NOTE — Telephone Encounter (Signed)
Message copied by Annabell Sabal on Wed Jul 24, 2013  8:19 AM ------      Message from: Jerene Bears      Created: Tue Jul 23, 2013  3:10 PM       He can get B12 IM through Dr.Moore his PCP, this will likely be easier for him      JMP      ----- Message -----         From: Annabell Sabal, CMA         Sent: 07/23/2013   8:53 AM           To: Jerene Bears, MD            When would you like him to start? I will set up his appointment when I talk to him.       ----- Message -----         From: Jerene Bears, MD         Sent: 07/19/2013   4:37 PM           To: Yani Lal A Scarlet Abad, CMA            pls let pt know and proceed with monthly IM B12      ----- Message -----         From: Annabell Sabal, CMA         Sent: 07/19/2013   2:21 PM           To: Jerene Bears, MD            Pt's insurance will not cover Nascobal, and looks like injection will be accepted.       Please advise      rgds                   ------

## 2013-07-24 NOTE — Telephone Encounter (Signed)
Spoke to pt. Let him know the nasal B-12 (Nascobal) will not be covered by his insurance, but the injections will be and he can set those up with his PCP. Pt verbalized understanding and said he had a feeling that may be the case and laready sent a message to his PCP.

## 2013-07-29 ENCOUNTER — Encounter: Payer: Self-pay | Admitting: Family Medicine

## 2013-08-27 ENCOUNTER — Encounter: Payer: Self-pay | Admitting: Family Medicine

## 2013-08-27 ENCOUNTER — Ambulatory Visit (INDEPENDENT_AMBULATORY_CARE_PROVIDER_SITE_OTHER): Payer: Medicare Other | Admitting: Family Medicine

## 2013-08-27 VITALS — BP 96/59 | HR 72 | Temp 96.2°F | Ht 67.0 in | Wt 174.0 lb

## 2013-08-27 DIAGNOSIS — E559 Vitamin D deficiency, unspecified: Secondary | ICD-10-CM

## 2013-08-27 DIAGNOSIS — Z87898 Personal history of other specified conditions: Secondary | ICD-10-CM

## 2013-08-27 DIAGNOSIS — D649 Anemia, unspecified: Secondary | ICD-10-CM

## 2013-08-27 DIAGNOSIS — E039 Hypothyroidism, unspecified: Secondary | ICD-10-CM

## 2013-08-27 DIAGNOSIS — E539 Vitamin B deficiency, unspecified: Secondary | ICD-10-CM

## 2013-08-27 DIAGNOSIS — R5383 Other fatigue: Secondary | ICD-10-CM

## 2013-08-27 DIAGNOSIS — I2510 Atherosclerotic heart disease of native coronary artery without angina pectoris: Secondary | ICD-10-CM

## 2013-08-27 DIAGNOSIS — N3941 Urge incontinence: Secondary | ICD-10-CM

## 2013-08-27 DIAGNOSIS — R5381 Other malaise: Secondary | ICD-10-CM

## 2013-08-27 DIAGNOSIS — E785 Hyperlipidemia, unspecified: Secondary | ICD-10-CM

## 2013-08-27 NOTE — Progress Notes (Signed)
Subjective:    Patient ID: [name], male    DOB: [birth year], 80 y.o.   MRN: [id number]  HPI Pt here for follow up and management of chronic medical problems. The patient does complain of some fatigue and off balance feeling. He also complains of some congestion in the evening. The patient does complain of some slight urinary incontinence especially at night time described as just a few dribbles. He has seen a gastroenterologist and does have B12 deficiency.        Patient Active Problem List   Diagnosis Date Noted  . Abnormality of gait 06/07/2013  . Esophageal ulcer with bleeding 04/20/2013  . Acute respiratory failure 04/18/2013  . S/P left and ritght THA, AA 07/21/2011  . Special screening for malignant neoplasms, colon 01/03/2011  . Personal history of colonic polyps 01/03/2011  . COPD GOLD I 08/24/2010  . GI BLEED 11/17/2009  . ESOPHAGEAL REFLUX 08/25/2009  . BLOOD IN STOOL-MELENA 08/25/2009  . COLONIC POLYPS 07/17/2008  . ANEMIA 07/17/2008  . HYPOTENSION 07/17/2008  . ESOPHAGITIS 07/17/2008  . DIVERTICULOSIS, MILD 07/17/2008  . DEGENERATIVE JOINT DISEASE 07/17/2008  . OSTEOPOROSIS 07/17/2008  . BENIGN PROSTATIC HYPERTROPHY, HX OF 07/17/2008  . HYPERPROLACTINEMIA 10/24/2007  . PITUITARY ADENOMA 06/25/2007  . HYPOTHYROIDISM 06/25/2007  . HYPERLIPIDEMIA 06/22/2007  . CORONARY ARTERY DISEASE 06/22/2007   Outpatient Encounter Prescriptions as of 08/27/2013  Medication Sig  . albuterol (PROVENTIL HFA;VENTOLIN HFA) 108 (90 BASE) MCG/ACT inhaler Inhale 2 puffs into the lungs every 6 (six) hours as needed for wheezing or shortness of breath.  . bromocriptine (PARLODEL) 2.5 MG tablet TAKE ONE TABLET AT BEDTIME  . budesonide-formoterol (SYMBICORT) 160-4.5 MCG/ACT inhaler Inhale 2 puffs into the lungs 2 (two) times daily.  . cholecalciferol (VITAMIN D) 1000 UNITS tablet Take 1,000 Units by mouth 2 (two) times daily.  . Cyanocobalamin (NASCOBAL) 500 MCG/0.1ML SOLN Place  0.01 mLs (50 mcg total) into the nose once a week.  . ferrous sulfate 325 (65 FE) MG tablet Take 325 mg by mouth daily.   Marland Kitchen guaiFENesin (MUCINEX) 600 MG 12 hr tablet Take 600 mg by mouth 2 (two) times daily.   Marland Kitchen levothyroxine (SYNTHROID, LEVOTHROID) 50 MCG tablet Take 50 mcg by mouth daily before breakfast.  . magnesium oxide (MAG-OX) 400 MG tablet Take 400 mg by mouth daily.  . montelukast (SINGULAIR) 10 MG tablet TAKE 1 TABLET DAILY  . nitroGLYCERIN (NITROSTAT) 0.4 MG SL tablet Place 0.4 mg under the tongue every 5 (five) minutes as needed for chest pain.  . pantoprazole (PROTONIX) 40 MG tablet Take 1 tablet (40 mg total) by mouth daily.  . polyethylene glycol (MIRALAX / GLYCOLAX) packet Take 17 g by mouth daily.  . tamsulosin (FLOMAX) 0.4 MG CAPS capsule Take 1 capsule (0.4 mg total) by mouth daily.  Marland Kitchen tetrahydrozoline (VISINE) 0.05 % ophthalmic solution Place 1 drop into both eyes as needed (For dry eyes).  . vitamin E (VITAMIN E) 400 UNIT capsule Take 400 Units by mouth daily.  . traMADol (ULTRAM) 50 MG tablet Take 50 mg by mouth every 6 (six) hours as needed for pain.  . [DISCONTINUED] pantoprazole (PROTONIX) 40 MG tablet TAKE (1) TABLET TWICE A DAY.    Review of Systems  Constitutional: Positive for fatigue.  HENT: Positive for congestion (during the night).   Eyes: Negative.   Respiratory: Negative.   Cardiovascular: Negative.   Gastrointestinal: Negative.   Endocrine: Negative.   Genitourinary: Negative.   Musculoskeletal: Negative.   Skin:  Negative.   Allergic/Immunologic: Negative.   Neurological: Negative.        "Off- balance"- is on-going  Hematological: Negative.   Psychiatric/Behavioral: Negative.        Objective:   Physical Exam  Nursing note and vitals reviewed. Constitutional: He is oriented to person, place, and time. He appears well-developed and well-nourished. No distress.  HENT:  Head: Normocephalic and atraumatic.  Right Ear: External ear normal.    Left Ear: External ear normal.  Mouth/Throat: Oropharynx is clear and moist. No oropharyngeal exudate.  Minimal nasal congestion bilateral  Eyes: Conjunctivae and EOM are normal. Pupils are equal, round, and reactive to light. Right eye exhibits no discharge. Left eye exhibits no discharge. No scleral icterus.  Neck: Normal range of motion. Neck supple. No thyromegaly present.  Cardiovascular: Normal rate, regular rhythm, normal heart sounds and intact distal pulses.   No murmur heard. At 72 per minute  Pulmonary/Chest: Effort normal and breath sounds normal. No respiratory distress. He has no wheezes. He has no rales.  Minimal congestion with deep breathing, cleared with coughing.  Abdominal: Soft. Bowel sounds are normal. He exhibits no mass. There is no tenderness. There is no rebound and no guarding.  Genitourinary: Rectum normal.  Musculoskeletal: Normal range of motion. He exhibits no edema.  Lymphadenopathy:    He has no cervical adenopathy.  Neurological: He is alert and oriented to person, place, and time. He has normal reflexes. No cranial nerve deficit.  Reflexes lower extremity. Brisk bilaterally  Skin: Skin is warm and dry. No rash noted. No erythema. No pallor.  Psychiatric: He has a normal mood and affect. His behavior is normal. Judgment and thought content normal.   BP 96/59  Pulse 72  Temp(Src) 96.2 F (35.7 C) (Oral)  Ht _0  (1.702 m)  Wt 174 lb (78.926 kg)  BMI 27.25 kg/m2        Assessment & Plan:  1. CORONARY ARTERY DISEASE - POCT CBC; Future - BMP8+EGFR; Future - Hepatic function panel; Future  2. HYPERLIPIDEMIA - POCT CBC; Future - NMR, lipoprofile; Future  3. HYPOTHYROIDISM - POCT CBC; Future - Thyroid Panel With TSH; Future  4. ANEMIA - POCT CBC; Future  5. BENIGN PROSTATIC HYPERTROPHY, HX OF - POCT CBC; Future  6. Vitamin D deficiency - Vit D  25 hydroxy (rtn osteoporosis monitoring); Future  7. Vitamin B deficiency  8. Urge  incontinence of urine  9. Other fatigue  Patient Instructions                       Medicare Annual Wellness Visit  [facility] and the medical providers at [facility] strive to bring you the best medical care.  In doing so we not only want to address your current medical conditions and concerns but also to detect new conditions early and prevent illness, disease and health-related problems.    Medicare offers a yearly Wellness Visit which allows our clinical staff to assess your need for preventative services including immunizations, lifestyle education, counseling to decrease risk of preventable diseases and screening for fall risk and other medical concerns.    This visit is provided free of charge (no copay) for all Medicare recipients. The clinical pharmacists at [facility] have begun to conduct these Wellness Visits which will also include a thorough review of all your medications.    As you primary medical provider recommend that you make an appointment for your Annual Wellness  Visit if you have not done so already this year.  You may set up this appointment before you leave today or you may call back ([phone]) and schedule an appointment.  Please make sure when you call that you mention that you are scheduling your Annual Wellness Visit with the clinical pharmacist so that the appointment may be made for the proper length of time.     Continue current medications. Continue good therapeutic lifestyle changes which include good diet and exercise. Fall precautions discussed with patient. If an FOBT was given today- please return it to our front desk. If you are over 53 years old - you may need Prevnar 23 or the adult Pneumonia vaccine.  Drink plenty of water Monitor blood pressures more closely Take your Mucinex regularly   Arrie Senate MD

## 2013-08-27 NOTE — Patient Instructions (Addendum)
Medicare Annual Wellness Visit  [facility] and the medical providers at [facility] strive to bring you the best medical care.  In doing so we not only want to address your current medical conditions and concerns but also to detect new conditions early and prevent illness, disease and health-related problems.    Medicare offers a yearly Wellness Visit which allows our clinical staff to assess your need for preventative services including immunizations, lifestyle education, counseling to decrease risk of preventable diseases and screening for fall risk and other medical concerns.    This visit is provided free of charge (no copay) for all Medicare recipients. The clinical pharmacists at [facility] have begun to conduct these Wellness Visits which will also include a thorough review of all your medications.    As you primary medical provider recommend that you make an appointment for your Annual Wellness Visit if you have not done so already this year.  You may set up this appointment before you leave today or you may call back ([phone]) and schedule an appointment.  Please make sure when you call that you mention that you are scheduling your Annual Wellness Visit with the clinical pharmacist so that the appointment may be made for the proper length of time.     Continue current medications. Continue good therapeutic lifestyle changes which include good diet and exercise. Fall precautions discussed with patient. If an FOBT was given today- please return it to our front desk. If you are over 58 years old - you may need Prevnar 29 or the adult Pneumonia vaccine.  Drink plenty of water Monitor blood pressures more closely Take your Mucinex regularly

## 2013-08-27 NOTE — Addendum Note (Signed)
Addended by: Zannie Cove on: 08/27/2013 10:11 AM   Modules accepted: Orders

## 2013-08-29 ENCOUNTER — Other Ambulatory Visit (INDEPENDENT_AMBULATORY_CARE_PROVIDER_SITE_OTHER): Payer: Medicare Other

## 2013-08-29 DIAGNOSIS — D649 Anemia, unspecified: Secondary | ICD-10-CM

## 2013-08-29 DIAGNOSIS — E039 Hypothyroidism, unspecified: Secondary | ICD-10-CM

## 2013-08-29 DIAGNOSIS — R5383 Other fatigue: Secondary | ICD-10-CM

## 2013-08-29 DIAGNOSIS — E539 Vitamin B deficiency, unspecified: Secondary | ICD-10-CM

## 2013-08-29 DIAGNOSIS — E559 Vitamin D deficiency, unspecified: Secondary | ICD-10-CM

## 2013-08-29 DIAGNOSIS — N3941 Urge incontinence: Secondary | ICD-10-CM

## 2013-08-29 DIAGNOSIS — Z87898 Personal history of other specified conditions: Secondary | ICD-10-CM

## 2013-08-29 DIAGNOSIS — E785 Hyperlipidemia, unspecified: Secondary | ICD-10-CM

## 2013-08-29 LAB — POCT CBC
Granulocyte percent: 72.1 %G (ref 37–80)
HCT, POC: 40.6 % — AB (ref 43.5–53.7)
Hemoglobin: 13.3 g/dL — AB (ref 14.1–18.1)
Lymph, poc: 1.4 (ref 0.6–3.4)
MCH, POC: 30.1 pg (ref 27–31.2)
MCHC: 32.7 g/dL (ref 31.8–35.4)
MCV: 91.9 fL (ref 80–97)
MPV: 7.7 fL (ref 0–99.8)
POC Granulocyte: 5.7 (ref 2–6.9)
POC LYMPH PERCENT: 17.6 %L (ref 10–50)
Platelet Count, POC: 186 10*3/uL (ref 142–424)
RBC: 4.4 M/uL — AB (ref 4.69–6.13)
RDW, POC: 15.5 %
WBC: 7.9 10*3/uL (ref 4.6–10.2)

## 2013-09-01 LAB — THYROID PANEL WITH TSH
Free Thyroxine Index: 2.1 (ref 1.2–4.9)
T3 Uptake Ratio: 31 % (ref 24–39)
T4, Total: 6.8 ug/dL (ref 4.5–12.0)
TSH: 3.31 u[IU]/mL (ref 0.450–4.500)

## 2013-09-01 LAB — BMP8+EGFR
BUN/Creatinine Ratio: 12 (ref 10–22)
BUN: 12 mg/dL (ref 8–27)
CO2: 22 mmol/L (ref 18–29)
Calcium: 9.2 mg/dL (ref 8.6–10.2)
Chloride: 105 mmol/L (ref 97–108)
Creatinine, Ser: 1.03 mg/dL (ref 0.76–1.27)
GFR calc Af Amer: 80 mL/min/{1.73_m2} (ref >59)
GFR calc non Af Amer: 69 mL/min/{1.73_m2} (ref >59)
Glucose: 81 mg/dL (ref 65–99)
Potassium: 4.5 mmol/L (ref 3.5–5.2)
Sodium: 140 mmol/L (ref 134–144)

## 2013-09-01 LAB — NMR, LIPOPROFILE
Cholesterol: 187 mg/dL (ref 100–199)
HDL Cholesterol by NMR: 52 mg/dL (ref >39)
HDL Particle Number: 28.8 umol/L — ABNORMAL LOW (ref >=30.5)
LDL Particle Number: 1221 nmol/L — ABNORMAL HIGH (ref <1000)
LDL Size: 21.8 nm (ref >20.5)
LDLC SERPL CALC-MCNC: 121 mg/dL — ABNORMAL HIGH (ref 0–99)
LP-IR Score: <25 (ref <=45)
Small LDL Particle Number: 213 nmol/L (ref <=527)
Triglycerides by NMR: 71 mg/dL (ref 0–149)

## 2013-09-01 LAB — HEPATIC FUNCTION PANEL
ALT: 11 IU/L (ref 0–44)
AST: 21 IU/L (ref 0–40)
Albumin: 3.9 g/dL (ref 3.5–4.8)
Alkaline Phosphatase: 85 IU/L (ref 39–117)
Bilirubin, Direct: 0.15 mg/dL (ref 0.00–0.40)
Total Bilirubin: 0.6 mg/dL (ref 0.0–1.2)
Total Protein: 6.7 g/dL (ref 6.0–8.5)

## 2013-09-01 LAB — TESTOSTERONE,FREE AND TOTAL
Testosterone, Free: 2.8 pg/mL — ABNORMAL LOW (ref 6.6–18.1)
Testosterone: 317 ng/dL — ABNORMAL LOW (ref 348–1197)

## 2013-09-01 LAB — VITAMIN B12: Vitamin B-12: 604 pg/mL (ref 211–946)

## 2013-09-01 LAB — VITAMIN D 25 HYDROXY (VIT D DEFICIENCY, FRACTURES): Vit D, 25-Hydroxy: 31.5 ng/mL (ref 30.0–100.0)

## 2013-09-10 ENCOUNTER — Other Ambulatory Visit: Payer: Self-pay | Admitting: Endocrinology

## 2013-09-11 ENCOUNTER — Encounter: Payer: Self-pay | Admitting: Family Medicine

## 2013-09-11 ENCOUNTER — Ambulatory Visit (INDEPENDENT_AMBULATORY_CARE_PROVIDER_SITE_OTHER): Payer: Medicare Other | Admitting: Cardiology

## 2013-09-11 ENCOUNTER — Encounter: Payer: Self-pay | Admitting: Cardiology

## 2013-09-11 VITALS — BP 116/69 | HR 88 | Ht 67.0 in | Wt 170.0 lb

## 2013-09-11 DIAGNOSIS — I2510 Atherosclerotic heart disease of native coronary artery without angina pectoris: Secondary | ICD-10-CM

## 2013-09-11 NOTE — Patient Instructions (Signed)
The current medical regimen is effective;  continue present plan and medications.  Follow up in 1 year with Dr Hochrein.  You will receive a letter in the mail 2 months before you are due.  Please call us when you receive this letter to schedule your follow up appointment.  

## 2013-09-11 NOTE — Progress Notes (Signed)
HPI The patient presents for followup of his known coronary disease. He had a negative stress perfusion study prior to hip surgery in 2013. Since then he's had no new symptoms. The patient denies any new symptoms such as chest discomfort, neck or arm discomfort. There has been no new shortness of breath, PND or orthopnea. There have been no reported palpitations, presyncope or syncope.  He tries to remain active but is limited by some unsteadiness.  He has had a recent GI bleeding and I reviewed these records. There were no cardiac events even though his hemoglobin 7. He also is being followed for a pituitary tumor.  Allergies  Allergen Reactions  . Lortab [Hydrocodone-Acetaminophen] Nausea And Vomiting  . Penicillins Other (See Comments)    Light headed  . Zetia [Ezetimibe] Other (See Comments)    Muscle weakness  . Zocor [Simvastatin] Nausea Only and Other (See Comments)    muscle weakness    Current Outpatient Prescriptions  Medication Sig Dispense Refill  . albuterol (PROVENTIL HFA;VENTOLIN HFA) 108 (90 BASE) MCG/ACT inhaler Inhale 2 puffs into the lungs every 6 (six) hours as needed for wheezing or shortness of breath.      Marland Kitchen aspirin EC 81 MG tablet Take 81 mg by mouth daily.      . bromocriptine (PARLODEL) 2.5 MG tablet TAKE ONE TABLET AT BEDTIME  30 tablet  3  . budesonide-formoterol (SYMBICORT) 160-4.5 MCG/ACT inhaler Inhale 2 puffs into the lungs 2 (two) times daily.      . cholecalciferol (VITAMIN D) 1000 UNITS tablet Take 1,000 Units by mouth 2 (two) times daily.      . Cyanocobalamin (NASCOBAL) 500 MCG/0.1ML SOLN Place 0.01 mLs (50 mcg total) into the nose once a week.  1 Bottle  2  . ferrous sulfate 325 (65 FE) MG tablet Take 325 mg by mouth daily.       Marland Kitchen levothyroxine (SYNTHROID, LEVOTHROID) 50 MCG tablet TAKE 1 TABLET IN THE MORNING  30 tablet  3  . magnesium oxide (MAG-OX) 400 MG tablet Take 400 mg by mouth daily.      . montelukast (SINGULAIR) 10 MG tablet TAKE 1 TABLET  DAILY  30 tablet  3  . nitroGLYCERIN (NITROSTAT) 0.4 MG SL tablet Place 0.4 mg under the tongue every 5 (five) minutes as needed for chest pain.      . pantoprazole (PROTONIX) 40 MG tablet Take 1 tablet (40 mg total) by mouth daily.  30 tablet  3  . polyethylene glycol (MIRALAX / GLYCOLAX) packet Take 17 g by mouth daily.  14 each  0  . pravastatin (PRAVACHOL) 20 MG tablet Take 20 mg by mouth daily.      . tamsulosin (FLOMAX) 0.4 MG CAPS capsule Take 1 capsule (0.4 mg total) by mouth daily.  30 capsule  6  . tetrahydrozoline (VISINE) 0.05 % ophthalmic solution Place 1 drop into both eyes as needed (For dry eyes).      . traMADol (ULTRAM) 50 MG tablet Take 50 mg by mouth every 6 (six) hours as needed for pain.      . vitamin E (VITAMIN E) 400 UNIT capsule Take 400 Units by mouth daily.      Marland Kitchen guaiFENesin (MUCINEX) 600 MG 12 hr tablet Take 600 mg by mouth 2 (two) times daily.        Current Facility-Administered Medications  Medication Dose Route Frequency Provider Last Rate Last Dose  . cyanocobalamin ((VITAMIN B-12)) injection 1,000 mcg  1,000 mcg Intramuscular  Once Kathrynn Ducking, MD        Past Medical History  Diagnosis Date  . Emphysema   . CAD (coronary artery disease)     left main had  a 20% stenosis.  Left anterior descending had 40-50% proximal   stenosis.  Left circumflex had an 80% proximal stenosis.  The  first obtuse marginal artery had a 60% mid stenosis.  The  right  coronary artery  had  a 50-60% proximal stenosis with a 60-70%  mid stenosis  The patient was managed medically.   Marland Kitchen COPD (chronic obstructive pulmonary disease)   . Hypothyroidism   . Anemia   . Blood transfusion june 2011, 5 units given    july 2011 some units given  . Enlarged prostate     elevated psa recently  . GERD (gastroesophageal reflux disease)   . Arthritis   . Complication of anesthesia     likes spinal due to copd  . Dizziness     occasional  . Shortness of breath     with exertion  .  Mass, scrotum     L scrotum - Sees Dr. Jeffie Pollock  . Abnormality of gait 06/07/2013  . Pituitary macroadenoma   . Restless legs syndrome (RLS)     Past Surgical History  Procedure Laterality Date  . Coronary stent placement  5 yrs ago  . Hiatal hernia repair  01-04-2008  . Foot surgery  1994 left, 2002 right foot    bilateral foot reconstruciton  . Abdominal hernia repair   2008  . Cataract extraction both eyes    . Total hip arthroplasty  07/21/2011    Procedure: TOTAL HIP ARTHROPLASTY ANTERIOR APPROACH;  Surgeon: Mauri Pole, MD;  Location: [facility];  Service: Orthopedics;  Laterality: Left;  . Coronary angioplasty    . Total hip arthroplasty Right 09/07/2012    Procedure: RIGHT TOTAL HIP ARTHROPLASTY ANTERIOR APPROACH;  Surgeon: Mcarthur Rossetti, MD;  Location: [facility];  Service: Orthopedics;  Laterality: Right;  . Foot surgery      reconstruction of both feet  . Esophagogastroduodenoscopy N/A 04/20/2013    Procedure: ESOPHAGOGASTRODUODENOSCOPY (EGD);  Surgeon: Inda Castle, MD;  Location: [facility];  Service: Endoscopy;  Laterality: N/A;    ROS: Otherwise as stated in the HPI and negative for all other systems.  PHYSICAL EXAM BP 116/69  Pulse 88  Ht 5\' 7"  (1.702 m)  Wt 170 lb (77.111 kg)  BMI 26.62 kg/m2 GENERAL:  Well appearing HEENT:  Pupils equal round and reactive, fundi not visualized, oral mucosa unremarkable NECK:  No jugular venous distention, waveform within normal limits, carotid upstroke brisk and symmetric, no bruits, no thyromegaly LYMPHATICS:  No cervical, inguinal adenopathy LUNGS:  Clear to auscultation bilaterally BACK:  No CVA tenderness CHEST:  Unremarkable HEART:  PMI not displaced or sustained,S1 and S2 within normal limits, no S3, no S4, no clicks, no rubs, no murmurs ABD:  Flat, positive bowel sounds normal in frequency in pitch, no bruits, no rebound, no guarding, no midline pulsatile mass, no hepatomegaly, no splenomegaly EXT:  2 plus pulses  upper, decreased DP/PT bilateral, mild edema, no cyanosis no clubbing  EKG:  Sinus rhythm, incomplete right bundle branch block, rate 73, axis within normal limits, intervals within normal limits, no acute ST-T wave changes. 09/11/2013  ASSESSMENT AND PLAN  CORONARY ARTERY DISEASE -  The patient has no new sypmtoms.  No further cardiovascular testing is indicated.  We will continue with aggressive  risk reduction and meds as listed.  HYPERLIPIDEMIA -  This is followed by Clois Dupes, MD who watches this closely.

## 2013-09-12 ENCOUNTER — Encounter: Payer: Self-pay | Admitting: Family Medicine

## 2013-09-13 ENCOUNTER — Other Ambulatory Visit: Payer: Self-pay | Admitting: Pharmacist

## 2013-09-13 DIAGNOSIS — M810 Age-related osteoporosis without current pathological fracture: Secondary | ICD-10-CM

## 2013-09-14 ENCOUNTER — Encounter: Payer: Self-pay | Admitting: Neurology

## 2013-09-14 ENCOUNTER — Encounter: Payer: Self-pay | Admitting: Family Medicine

## 2013-09-16 ENCOUNTER — Encounter: Payer: Self-pay | Admitting: Family Medicine

## 2013-09-16 ENCOUNTER — Other Ambulatory Visit (INDEPENDENT_AMBULATORY_CARE_PROVIDER_SITE_OTHER): Payer: Medicare Other

## 2013-09-16 DIAGNOSIS — R7989 Other specified abnormal findings of blood chemistry: Secondary | ICD-10-CM

## 2013-09-16 DIAGNOSIS — E291 Testicular hypofunction: Secondary | ICD-10-CM

## 2013-09-16 NOTE — Progress Notes (Signed)
Pt came in for lab  only 

## 2013-09-17 LAB — TESTOSTERONE,FREE AND TOTAL
Testosterone, Free: 6.2 pg/mL — ABNORMAL LOW (ref 6.6–18.1)
Testosterone: 307 ng/dL — ABNORMAL LOW (ref 348–1197)

## 2013-09-28 ENCOUNTER — Encounter: Payer: Self-pay | Admitting: Family Medicine

## 2013-10-02 ENCOUNTER — Ambulatory Visit (INDEPENDENT_AMBULATORY_CARE_PROVIDER_SITE_OTHER): Payer: Medicare Other

## 2013-10-02 ENCOUNTER — Ambulatory Visit (INDEPENDENT_AMBULATORY_CARE_PROVIDER_SITE_OTHER): Payer: Medicare Other | Admitting: Pharmacist

## 2013-10-02 ENCOUNTER — Encounter: Payer: Self-pay | Admitting: Pharmacist

## 2013-10-02 VITALS — Ht 66.5 in | Wt 171.0 lb

## 2013-10-02 DIAGNOSIS — M8080XD Other osteoporosis with current pathological fracture, unspecified site, subsequent encounter for fracture with routine healing: Secondary | ICD-10-CM

## 2013-10-02 DIAGNOSIS — M810 Age-related osteoporosis without current pathological fracture: Secondary | ICD-10-CM

## 2013-10-02 NOTE — Progress Notes (Signed)
Patient ID: [name], male   DOB: 1933/01/09, 80 y.o.   MRN: [id number] [facility] Current Height: Height: 5' 6.5" (168.9 cm)      Current Weight: Weight: 171 lb (77.565 kg)       Ethnicity:Caucasian    HPI: Patient with history of osteoporosis in both hips, though due to bilateral hip replacement we are unable to complete DEXA of hips today but past values show T-Scores less than -2.5.  Back Pain?  Yes       Kyphosis?  Yes Prior fracture?  Yes - ribs multiple times Med(s) for Osteoporosis/Osteopenia:  Reclast - last was 12/15/2011 Med(s) previously tried for Osteoporosis/Osteopenia:  actonel - discontinued during a hospitalization in 2013 doe to gastrointestinal bleed                                                             PMH: Steroid Use?  No Thyroid med?  Yes History of cancer?  No History of digestive disorders (ie Crohn's)?  Yes Current or previous eating disorders?  No Last Vitamin D Result:  31.5 Last GFR Result:  53   FH/SH: Family history of osteoporosis?  No Parent with history of hip fracture?  No Family history of breast cancer?  No Exercise?  Yes - lifts weights some at home  Smoking?  No Alcohol?  No    Calcium Assessment Calcium Intake  # of servings/day  Calcium mg  Milk (8 oz) 1  x  300  = 300mg   Yogurt (4 oz) 0 x  200 = 0  Cheese (1 oz) 1 x  200 = 200mg   Other Calcium sources   250mg   Ca supplement 0 = 0   Estimated calcium intake per day 750mg     DEXA Results Date of Test T-Score for AP Spine L1-L4 T-Score for Total Left Hip T-Score for Total Right Hip  10/02/2013 -1.3 -- --  01/20/2010 -1.7 -2.0 -2.0  11/28/2007 -1.7 -2.1 -2.1       **T-Score for neck of left hip in 2003 was -2.9  FRAX 10 year estimate: Total FX risk:  14%(consider medication if >/= 20%) Hip FX risk:  5%  (consider medication if >/= 3%)  Assessment: Osteoporosis of hip / osteopenia in spine  Recommendations: 1.  Restart Reclast inject yearly - will get  BMET adn then refer to [facility] for infusion 2.  recommend calcium 1200mg  daily through supplementation or diet.  3.  recommend weight bearing exercise - 30 minutes at least 4 days per week.   4.  Counseled and educated about fall risk and prevention.  Recheck DEXA:  2 years  Time spent counseling patient:  20 minutes  Cherre Robins, PharmD, CPP

## 2013-10-03 ENCOUNTER — Encounter: Payer: Self-pay | Admitting: Pharmacist

## 2013-10-03 ENCOUNTER — Encounter: Payer: Self-pay | Admitting: Family Medicine

## 2013-10-03 LAB — BMP8+EGFR
BUN/Creatinine Ratio: 14 (ref 10–22)
BUN: 14 mg/dL (ref 8–27)
CO2: 21 mmol/L (ref 18–29)
Calcium: 8.7 mg/dL (ref 8.6–10.2)
Chloride: 100 mmol/L (ref 97–108)
Creatinine, Ser: 1.02 mg/dL (ref 0.76–1.27)
GFR calc Af Amer: 80 mL/min/{1.73_m2} (ref >59)
GFR calc non Af Amer: 70 mL/min/{1.73_m2} (ref >59)
Glucose: 91 mg/dL (ref 65–99)
Potassium: 4.4 mmol/L (ref 3.5–5.2)
Sodium: 136 mmol/L (ref 134–144)

## 2013-10-03 LAB — PHOSPHORUS: Phosphorus: 3.6 mg/dL (ref 2.5–4.5)

## 2013-10-03 LAB — MAGNESIUM: Magnesium: 2.1 mg/dL (ref 1.6–2.6)

## 2013-10-10 ENCOUNTER — Encounter: Payer: Self-pay | Admitting: Family Medicine

## 2013-10-10 NOTE — Discharge Instructions (Signed)

## 2013-10-13 ENCOUNTER — Encounter: Payer: Self-pay | Admitting: Family Medicine

## 2013-10-14 ENCOUNTER — Encounter: Payer: Self-pay | Admitting: Family Medicine

## 2013-10-15 ENCOUNTER — Encounter (HOSPITAL_COMMUNITY)
Admission: RE | Admit: 2013-10-15 | Discharge: 2013-10-15 | Disposition: A | Discharge status: Home / Self Care | Payer: Medicare Other | Source: Ambulatory Visit | Attending: Family Medicine | Admitting: Family Medicine

## 2013-10-15 DIAGNOSIS — M810 Age-related osteoporosis without current pathological fracture: Secondary | ICD-10-CM | POA: Diagnosis present

## 2013-10-15 MED ORDER — SODIUM CHLORIDE 0.9 % IV SOLN
INTRAVENOUS | Status: DC
  Administered 2013-10-15: 200 mL via INTRAVENOUS

## 2013-10-15 MED ORDER — ZOLEDRONIC ACID 5 MG/100ML IV SOLN
5.0000 mg | Freq: Once | INTRAVENOUS | Status: AC
  Administered 2013-10-15: 5 mg via INTRAVENOUS
  Filled 2013-10-15: qty 100

## 2013-10-16 ENCOUNTER — Encounter: Payer: Self-pay | Admitting: Gastroenterology

## 2013-10-18 ENCOUNTER — Other Ambulatory Visit: Payer: Self-pay | Admitting: Endocrinology

## 2013-10-21 ENCOUNTER — Other Ambulatory Visit: Payer: Self-pay

## 2013-10-28 ENCOUNTER — Ambulatory Visit (INDEPENDENT_AMBULATORY_CARE_PROVIDER_SITE_OTHER): Payer: Medicare Other | Admitting: Neurology

## 2013-10-28 ENCOUNTER — Encounter: Payer: Self-pay | Admitting: Neurology

## 2013-10-28 VITALS — BP 124/73 | HR 73 | Wt 177.5 lb

## 2013-10-28 DIAGNOSIS — R269 Unspecified abnormalities of gait and mobility: Secondary | ICD-10-CM

## 2013-10-28 NOTE — Patient Instructions (Signed)

## 2013-10-28 NOTE — Progress Notes (Signed)
Reason for visit: Gait disorder  [name] is an 80 y.o. male  History of present illness:  [name] is a 80 year old right-handed white male with a history of a gait disorder. The patient has had some mild ventriculomegaly, but upon review of the MRI of the brain, it appeared to be consistent with the degree of atrophy that he has. He does have a pituitary macroadenoma. The patient has undergone a lumbar puncture, and it appeared that he may have gained some benefit with his speed of walking following the procedure. The patient has good and bad days with walking, and he may have episodes of cognitive clouding. Over the last 2 or 3 weeks, he believes that his walking and his cognitive abilities have significantly improved. He denies any recent falls. He may use a cane for ambulation. The patient returns to this office for further evaluation.  Past Medical History  Diagnosis Date  . Emphysema   . CAD (coronary artery disease)     left main had  a 20% stenosis.  Left anterior descending had 40-50% proximal   stenosis.  Left circumflex had an 80% proximal stenosis.  The  first obtuse marginal artery had a 60% mid stenosis.  The  right  coronary artery  had  a 50-60% proximal stenosis with a 60-70%  mid stenosis  The patient was managed medically.   Marland Kitchen COPD (chronic obstructive pulmonary disease)   . Hypothyroidism   . Anemia   . Blood transfusion june 2011, 5 units given    july 2011 some units given  . Enlarged prostate     elevated psa recently  . GERD (gastroesophageal reflux disease)   . Arthritis   . Complication of anesthesia     likes spinal due to copd  . Dizziness     occasional  . Shortness of breath     with exertion  . Mass, scrotum     L scrotum - Sees Dr. Jeffie Pollock  . Abnormality of gait 06/07/2013  . Pituitary macroadenoma   . Restless legs syndrome (RLS)     Past Surgical History  Procedure Laterality Date  . Coronary stent placement  5 yrs ago  . Hiatal  hernia repair  01-04-2008  . Foot surgery  1994 left, 2002 right foot    bilateral foot reconstruciton  . Abdominal hernia repair   2008  . Cataract extraction both eyes    . Total hip arthroplasty  07/21/2011    Procedure: TOTAL HIP ARTHROPLASTY ANTERIOR APPROACH;  Surgeon: Mauri Pole, MD;  Location: [facility];  Service: Orthopedics;  Laterality: Left;  . Coronary angioplasty    . Total hip arthroplasty Right 09/07/2012    Procedure: RIGHT TOTAL HIP ARTHROPLASTY ANTERIOR APPROACH;  Surgeon: Mcarthur Rossetti, MD;  Location: [facility];  Service: Orthopedics;  Laterality: Right;  . Foot surgery      reconstruction of both feet  . Esophagogastroduodenoscopy N/A 04/20/2013    Procedure: ESOPHAGOGASTRODUODENOSCOPY (EGD);  Surgeon: Inda Castle, MD;  Location: [facility];  Service: Endoscopy;  Laterality: N/A;    Family History  Problem Relation Age of Onset  . Heart disease Sister   . Heart failure Father   . Colon cancer Neg Hx     Social history:  reports that he has never smoked. He has never used smokeless tobacco. He reports that he does not drink alcohol or use illicit drugs.    Allergies  Allergen Reactions  . Lortab [Hydrocodone-Acetaminophen]  Nausea And Vomiting  . Penicillins Other (See Comments)    Light headed  . Zetia [Ezetimibe] Other (See Comments)    Muscle weakness  . Zocor [Simvastatin] Nausea Only and Other (See Comments)    muscle weakness    Medications:  Current Outpatient Prescriptions on File Prior to Visit  Medication Sig Dispense Refill  . albuterol (PROVENTIL HFA;VENTOLIN HFA) 108 (90 BASE) MCG/ACT inhaler Inhale 2 puffs into the lungs every 6 (six) hours as needed for wheezing or shortness of breath.      Marland Kitchen aspirin EC 81 MG tablet Take 81 mg by mouth daily.      . bromocriptine (PARLODEL) 2.5 MG tablet TAKE ONE TABLET AT BEDTIME  30 tablet  0  . budesonide-formoterol (SYMBICORT) 160-4.5 MCG/ACT inhaler Inhale 2 puffs into the lungs 2 (two) times  daily.      . calcium carbonate (OS-CAL) 600 MG TABS tablet Take 600 mg by mouth 2 (two) times daily with a meal.      . cholecalciferol (VITAMIN D) 1000 UNITS tablet Take 1,000 Units by mouth 2 (two) times daily.      . ferrous sulfate 325 (65 FE) MG tablet Take 325 mg by mouth daily.       Marland Kitchen guaiFENesin (MUCINEX) 600 MG 12 hr tablet Take 600 mg by mouth 2 (two) times daily.       Marland Kitchen levothyroxine (SYNTHROID, LEVOTHROID) 50 MCG tablet TAKE 1 TABLET IN THE MORNING  30 tablet  3  . magnesium oxide (MAG-OX) 400 MG tablet Take 400 mg by mouth daily.      . montelukast (SINGULAIR) 10 MG tablet TAKE 1 TABLET DAILY  30 tablet  3  . nitroGLYCERIN (NITROSTAT) 0.4 MG SL tablet Place 0.4 mg under the tongue every 5 (five) minutes as needed for chest pain.      . pantoprazole (PROTONIX) 40 MG tablet Take 1 tablet (40 mg total) by mouth daily.  30 tablet  3  . polyethylene glycol (MIRALAX / GLYCOLAX) packet Take 17 g by mouth daily.  14 each  0  . pravastatin (PRAVACHOL) 20 MG tablet Take 20 mg by mouth daily.      . tamsulosin (FLOMAX) 0.4 MG CAPS capsule Take 1 capsule (0.4 mg total) by mouth daily.  30 capsule  6  . tetrahydrozoline (VISINE) 0.05 % ophthalmic solution Place 1 drop into both eyes as needed (For dry eyes).      . traMADol (ULTRAM) 50 MG tablet Take 50 mg by mouth every 6 (six) hours as needed for pain.      . vitamin E (VITAMIN E) 400 UNIT capsule Take 400 Units by mouth daily.       Current Facility-Administered Medications on File Prior to Visit  Medication Dose Route Frequency Provider Last Rate Last Dose  . cyanocobalamin ((VITAMIN B-12)) injection 1,000 mcg  1,000 mcg Intramuscular Once Kathrynn Ducking, MD        ROS:  Out of a complete 14 system review of symptoms, the patient complains only of the following symptoms, and all other reviewed systems are negative.  Ringing in the ears Shortness of breath Frequent waking Urinary difficulty Back pain, walking  difficulties Bruising easily Memory loss, numbness, weakness Depression, anxiety  Blood pressure 124/73, pulse 73, weight 177 lb 8 oz (80.513 kg).  Physical Exam  General: The patient is alert and cooperative at the time of the examination.  Skin: No significant peripheral edema is noted.   Neurologic Exam  Mental status: The patient is oriented x 3.  Cranial nerves: Facial symmetry is present. Speech is normal, no aphasia or dysarthria is noted. Extraocular movements are full. Visual fields are full. Mid lag is seen. Good facial animation is noted.  Motor: The patient has good strength in all 4 extremities.  Sensory examination: Soft sensation is symmetric on the face, arms, and legs.  Coordination: The patient has good finger-nose-finger and heel-to-shin bilaterally.  Gait and station: The patient has the ability to stand with arms crossed from a seated position. Once up, the patient appears to take good stride, has good arm swing. Tandem gait is unsteady.  Romberg is negative. No drift is seen.  Reflexes: Deep tendon reflexes are symmetric.   Assessment/Plan:  One. Gait disorder  2. Pituitary macroadenoma  3. Mild vitamin B12 deficiency  The patient appears to be quite stable with his ability to walk. The patient does have some lid lag on clinical examination, but he has good animation of the face, and he does have bilateral arm swing with walking. There are no definite features of Parkinson's disease. The patient does have some variation in his functional level from day to day, but overall, he is doing well. We will continue to watch the patient on a conservative basis. He is to remain active, he indicates that he does exercise on a regular basis. He will followup in about 4 months.  Jill Alexanders MD 10/28/2013 7:27 PM  [facility] [address] [place],  [address]  Phone [phone] Fax [phone]

## 2013-10-31 ENCOUNTER — Telehealth: Payer: Self-pay | Admitting: *Deleted

## 2013-10-31 MED ORDER — DOXYCYCLINE HYCLATE 100 MG PO TABS: 100.0000 mg | ORAL_TABLET | Freq: Two times a day (BID) | ORAL | Status: DC

## 2013-10-31 NOTE — Telephone Encounter (Signed)
Doxycycline 100 mg twice daily with food for 2 weeks, #28 Please call patient and let him know

## 2013-10-31 NOTE — Telephone Encounter (Signed)
Pt states that he saw [facility] yesterday and that when he mentioned that he had had multiple tick bites - he recommended him to call PCP. He has had 1 bite on 10/03/13 and one bite on 10/24/13.  No fever, or other symptoms - other than redness at the bite site.  Do you feel he needs a round of Doxy?

## 2013-10-31 NOTE — Telephone Encounter (Signed)
Patient aware and rx sent into pharmacy 

## 2013-11-01 ENCOUNTER — Encounter: Payer: Self-pay | Admitting: Family Medicine

## 2013-11-05 ENCOUNTER — Telehealth: Payer: Self-pay | Admitting: *Deleted

## 2013-11-05 NOTE — Telephone Encounter (Signed)
Error

## 2013-11-06 ENCOUNTER — Encounter: Payer: Self-pay | Admitting: Endocrinology

## 2013-11-06 ENCOUNTER — Ambulatory Visit (INDEPENDENT_AMBULATORY_CARE_PROVIDER_SITE_OTHER): Payer: Medicare Other | Admitting: Endocrinology

## 2013-11-06 VITALS — BP 118/80 | HR 76 | Temp 97.7°F | Ht 66.5 in | Wt 177.0 lb

## 2013-11-06 DIAGNOSIS — D352 Benign neoplasm of pituitary gland: Secondary | ICD-10-CM

## 2013-11-06 DIAGNOSIS — D353 Benign neoplasm of craniopharyngeal duct: Principal | ICD-10-CM

## 2013-11-06 NOTE — Patient Instructions (Signed)
blood tests are being requested for you today.  We'll contact you with results.   Please return in 1 year.   

## 2013-11-06 NOTE — Progress Notes (Signed)
Subjective:    Patient ID: [name], male    DOB: June 27, 1932, 80 y.o.   MRN: [id number]  HPI the status of at least 3 ongoing medical problems is addressed today: pituitary macroadenoma (found incidentally on mri of the c-spine in 2009; he sees Dr Carloyn Manner for this--he has f/u appt there in 2 days):  no headache.   hyperprolactinemia:  he seldom has lightheadedness.  He says he often takes just 1/2 of a parlodel qhs    hypothyroidism:  denies weight change.   Past Medical History  Diagnosis Date  . Emphysema   . CAD (coronary artery disease)     left main had  a 20% stenosis.  Left anterior descending had 40-50% proximal   stenosis.  Left circumflex had an 80% proximal stenosis.  The  first obtuse marginal artery had a 60% mid stenosis.  The  right  coronary artery  had  a 50-60% proximal stenosis with a 60-70%  mid stenosis  The patient was managed medically.   Marland Kitchen COPD (chronic obstructive pulmonary disease)   . Hypothyroidism   . Anemia   . Blood transfusion june 2011, 5 units given    july 2011 some units given  . Enlarged prostate     elevated psa recently  . GERD (gastroesophageal reflux disease)   . Arthritis   . Complication of anesthesia     likes spinal due to copd  . Dizziness     occasional  . Shortness of breath     with exertion  . Mass, scrotum     L scrotum - Sees Dr. Jeffie Pollock  . Abnormality of gait 06/07/2013  . Pituitary macroadenoma   . Restless legs syndrome (RLS)     Past Surgical History  Procedure Laterality Date  . Coronary stent placement  5 yrs ago  . Hiatal hernia repair  01-04-2008  . Foot surgery  1994 left, 2002 right foot    bilateral foot reconstruciton  . Abdominal hernia repair   2008  . Cataract extraction both eyes    . Total hip arthroplasty  07/21/2011    Procedure: TOTAL HIP ARTHROPLASTY ANTERIOR APPROACH;  Surgeon: Mauri Pole, MD;  Location: [facility];  Service: Orthopedics;  Laterality: Left;  . Coronary angioplasty    . Total hip  arthroplasty Right 09/07/2012    Procedure: RIGHT TOTAL HIP ARTHROPLASTY ANTERIOR APPROACH;  Surgeon: Mcarthur Rossetti, MD;  Location: [facility];  Service: Orthopedics;  Laterality: Right;  . Foot surgery      reconstruction of both feet  . Esophagogastroduodenoscopy N/A 04/20/2013    Procedure: ESOPHAGOGASTRODUODENOSCOPY (EGD);  Surgeon: Inda Castle, MD;  Location: [facility];  Service: Endoscopy;  Laterality: N/A;    History   Social History  . Marital Status: Married    Spouse Name: N/A    Number of Children: 2  . Years of Education: hs   Occupational History  . retired    Social History Main Topics  . Smoking status: Never Smoker   . Smokeless tobacco: Never Used  . Alcohol Use: No  . Drug Use: No  . Sexual Activity: Not on file   Other Topics Concern  . Not on file   Social History Narrative  . No narrative on file    Current Outpatient Prescriptions on File Prior to Visit  Medication Sig Dispense Refill  . albuterol (PROVENTIL HFA;VENTOLIN HFA) 108 (90 BASE) MCG/ACT inhaler Inhale 2 puffs into the lungs every 6 (six)  hours as needed for wheezing or shortness of breath.      . budesonide-formoterol (SYMBICORT) 160-4.5 MCG/ACT inhaler Inhale 2 puffs into the lungs 2 (two) times daily.      . calcium carbonate (OS-CAL) 600 MG TABS tablet Take 600 mg by mouth 2 (two) times daily with a meal.      . cholecalciferol (VITAMIN D) 1000 UNITS tablet Take 1,000 Units by mouth 2 (two) times daily.      Marland Kitchen doxycycline (VIBRA-TABS) 100 MG tablet Take 1 tablet (100 mg total) by mouth 2 (two) times daily. Take with food  28 tablet  0  . ferrous sulfate 325 (65 FE) MG tablet Take 325 mg by mouth daily.       Marland Kitchen guaiFENesin (MUCINEX) 600 MG 12 hr tablet Take 600 mg by mouth 2 (two) times daily.       Marland Kitchen levothyroxine (SYNTHROID, LEVOTHROID) 50 MCG tablet TAKE 1 TABLET IN THE MORNING  30 tablet  3  . magnesium oxide (MAG-OX) 400 MG tablet Take 400 mg by mouth daily.      .  montelukast (SINGULAIR) 10 MG tablet TAKE 1 TABLET DAILY  30 tablet  3  . nitroGLYCERIN (NITROSTAT) 0.4 MG SL tablet Place 0.4 mg under the tongue every 5 (five) minutes as needed for chest pain.      . pantoprazole (PROTONIX) 40 MG tablet Take 1 tablet (40 mg total) by mouth daily.  30 tablet  3  . polyethylene glycol (MIRALAX / GLYCOLAX) packet Take 17 g by mouth daily.  14 each  0  . pravastatin (PRAVACHOL) 20 MG tablet Take 20 mg by mouth daily.      . tamsulosin (FLOMAX) 0.4 MG CAPS capsule Take 1 capsule (0.4 mg total) by mouth daily.  30 capsule  6  . tetrahydrozoline (VISINE) 0.05 % ophthalmic solution Place 1 drop into both eyes as needed (For dry eyes).      . traMADol (ULTRAM) 50 MG tablet Take 50 mg by mouth every 6 (six) hours as needed for pain.      . vitamin E (VITAMIN E) 400 UNIT capsule Take 400 Units by mouth daily.      Marland Kitchen aspirin EC 81 MG tablet Take 81 mg by mouth daily.       Current Facility-Administered Medications on File Prior to Visit  Medication Dose Route Frequency Provider Last Rate Last Dose  . cyanocobalamin ((VITAMIN B-12)) injection 1,000 mcg  1,000 mcg Intramuscular Once Kathrynn Ducking, MD        Allergies  Allergen Reactions  . Lortab [Hydrocodone-Acetaminophen] Nausea And Vomiting  . Penicillins Other (See Comments)    Light headed  . Zetia [Ezetimibe] Other (See Comments)    Muscle weakness  . Zocor [Simvastatin] Nausea Only and Other (See Comments)    muscle weakness    Family History  Problem Relation Age of Onset  . Heart disease Sister   . Heart failure Father   . Colon cancer Neg Hx     BP 118/80  Pulse 76  Temp(Src) 97.7 F (36.5 C) (Oral)  Ht 5' 6.5" (1.689 m)  Wt 177 lb (80.287 kg)  BMI 28.14 kg/m2  SpO2 91%  Review of Systems Denies n/v/weight change    Objective:   Physical Exam Vital signs: see vs page Gen: elderly, frail, no distress NECK: There is no palpable thyroid enlargement.  No thyroid nodule is palpable.  No  palpable lymphadenopathy at the anterior neck.  Lab Results  Component Value Date   TSH 3.310 08/29/2013   T4TOTAL 6.8 08/29/2013  prolactin=164  (i reviewed 2014 MRI report)    Assessment & Plan:  Pituitary adenoma, slightly enlarged, but the change is very slow.  Hyperprolactinemia, moderate exacerbation Dizziness, due to parlodel, in the past.  Despite this, he should try to increase the parlodel, due to the increasing size of the tumor. Hypothyroidism: well-replaced.  Patient is advised the following: Patient Instructions  blood tests are being requested for you today.  We'll contact you with results. Please return in 1 year.    (i have sent a prescription to your pharmacy, to double the parlodel)

## 2013-11-07 ENCOUNTER — Encounter: Payer: Self-pay | Admitting: Family Medicine

## 2013-11-07 LAB — PROLACTIN: Prolactin: 163.6 ng/mL — ABNORMAL HIGH (ref 2.1–17.1)

## 2013-11-07 MED ORDER — BROMOCRIPTINE MESYLATE 2.5 MG PO TABS: 2.5000 mg | ORAL_TABLET | Freq: Two times a day (BID) | ORAL | Status: DC

## 2013-11-25 ENCOUNTER — Ambulatory Visit (INDEPENDENT_AMBULATORY_CARE_PROVIDER_SITE_OTHER): Payer: Medicare Other

## 2013-11-25 DIAGNOSIS — Z23 Encounter for immunization: Secondary | ICD-10-CM

## 2013-11-29 ENCOUNTER — Encounter: Payer: Self-pay | Admitting: Endocrinology

## 2013-12-06 ENCOUNTER — Encounter: Payer: Self-pay | Admitting: Endocrinology

## 2013-12-09 ENCOUNTER — Encounter: Payer: Self-pay | Admitting: Internal Medicine

## 2013-12-18 ENCOUNTER — Other Ambulatory Visit: Payer: Medicare Other

## 2013-12-18 ENCOUNTER — Ambulatory Visit: Payer: Medicare Other

## 2013-12-19 ENCOUNTER — Ambulatory Visit (INDEPENDENT_AMBULATORY_CARE_PROVIDER_SITE_OTHER): Payer: Medicare Other | Admitting: Internal Medicine

## 2013-12-19 ENCOUNTER — Encounter: Payer: Self-pay | Admitting: Internal Medicine

## 2013-12-19 ENCOUNTER — Other Ambulatory Visit (INDEPENDENT_AMBULATORY_CARE_PROVIDER_SITE_OTHER): Payer: Medicare Other

## 2013-12-19 VITALS — BP 110/70 | HR 71 | Ht 67.0 in | Wt 177.2 lb

## 2013-12-19 DIAGNOSIS — D649 Anemia, unspecified: Secondary | ICD-10-CM

## 2013-12-19 DIAGNOSIS — K2210 Ulcer of esophagus without bleeding: Secondary | ICD-10-CM

## 2013-12-19 DIAGNOSIS — K449 Diaphragmatic hernia without obstruction or gangrene: Secondary | ICD-10-CM

## 2013-12-19 DIAGNOSIS — K219 Gastro-esophageal reflux disease without esophagitis: Secondary | ICD-10-CM

## 2013-12-19 LAB — CBC WITH DIFFERENTIAL/PLATELET
Basophils Absolute: 0.1 10*3/uL (ref 0.0–0.1)
Basophils Relative: 0.7 % (ref 0.0–3.0)
Eosinophils Absolute: 0.2 10*3/uL (ref 0.0–0.7)
Eosinophils Relative: 2.2 % (ref 0.0–5.0)
HCT: 39.6 % (ref 39.0–52.0)
Hemoglobin: 13.2 g/dL (ref 13.0–17.0)
Lymphocytes Relative: 15.2 % (ref 12.0–46.0)
Lymphs Abs: 1.2 10*3/uL (ref 0.7–4.0)
MCHC: 33.4 g/dL (ref 30.0–36.0)
MCV: 94.8 fl (ref 78.0–100.0)
Monocytes Absolute: 1.1 10*3/uL — ABNORMAL HIGH (ref 0.1–1.0)
Monocytes Relative: 14.1 % — ABNORMAL HIGH (ref 3.0–12.0)
Neutro Abs: 5.4 10*3/uL (ref 1.4–7.7)
Neutrophils Relative %: 67.8 % (ref 43.0–77.0)
Platelets: 204 10*3/uL (ref 150.0–400.0)
RBC: 4.18 Mil/uL — ABNORMAL LOW (ref 4.22–5.81)
RDW: 15 % (ref 11.5–15.5)
WBC: 8 10*3/uL (ref 4.0–10.5)

## 2013-12-19 MED ORDER — SUCRALFATE 1 GM/10ML PO SUSP: ORAL | Status: DC

## 2013-12-19 NOTE — Patient Instructions (Signed)
  Your physician has requested that you go to the basement for the following lab work before leaving today:  [place] have been scheduled for an endoscopy. Please follow written instructions given to you at your visit today. If you use inhalers (even only as needed), please bring them with you on the day of your procedure.  We have sent the following medications to your pharmacy for you to pick up at your convenience: Carafate  Continue taking your Pantoprazole

## 2013-12-19 NOTE — Progress Notes (Signed)
Subjective:    Patient ID: [name], male    DOB: [birth year], 80 y.o.   MRN: [id number]  HPI [name] is a 80 year old male with a past medical history of GERD with esophageal ulceration, history of upper GI bleed from esophageal ulceration, history of hiatal hernia status post repair with recurrent hiatal hernia, pituitary tumor on bromocriptine, normal pressure hydrocephalus, history of CAD, COPD who is seen in followup. He is here today with his wife. We'll exchange several e-mails and overall he is feeling well. He is still having days where he has more indigestion than others. He is taking pantoprazole 40 mg once daily and on some days he will take an additional 20 mg dose. He is also using a combination of apple cider vinegar with honey and water which she says helps with his indigestion. He is swallowing food and liquid without trouble though occasionally pills are slow to go down he says. His indigestion can lead to some epigastric discomfort but this is not a constant problem. Very occasionally he will feel a very focal tenderness to the left of his sternum just below his nipple. This comes and goes initially he wondered if this was angina but he has not used nitroglycerin for this pain. Overall he thinks his pain is better. He has recently increased his bromocriptine because of high prolactin levels. He is using MiraLax 17 g about every other day and this has resulted in much improved constipation. He denies rectal bleeding or melena. He is interested in getting his hemoglobin checked again today.  History of upper GI bleed in May and June of 2011 -- esophageal ulceration with visible vessel seen. last colonoscopy 01/03/2011, left-sided diverticulosis, no polyps or cancers.  His wife, also my patient, has been dealing with upper abdominal pain and nausea. We're working this up and she has an upper endoscopy scheduled to evaluate abnormal GI imaging. They are both concerned about  what might be found.  Review of Systems As per history of present illness, otherwise negative  Current Medications, Allergies, Past Medical History, Past Surgical History, Family History and Social History were reviewed in [employer] record.     Objective:   Physical Exam BP 110/70  Pulse 71  Ht 5\' 7"  (1.702 m)  Wt 177 lb 3.2 oz (80.377 kg)  BMI 27.75 kg/m2  SpO2 97% Constitutional: Well-developed and well-nourished. No distress.  HEENT: Normocephalic and atraumatic. Oropharynx is clear and moist. No oropharyngeal exudate. Conjunctivae are normal. No scleral icterus.  Neck: Neck supple. Trachea midline.  Cardiovascular: Normal rate, regular rhythm and intact distal pulses.  Pulmonary/chest: Effort normal and distant breath sounds normal. No wheezing, rales or rhonchi.  Abdominal: Soft, nontender, nondistended. Bowel sounds active throughout.  Extremities: no clubbing, cyanosis, or edema  Neurological: Alert and oriented to person place and time.  Skin: Skin is warm and dry. No rashes noted.  Psychiatric: Normal mood and affect. Behavior is normal.  CBC    Component Value Date/Time   WBC 8.0 12/19/2013 1136   WBC 7.9 08/29/2013 0919   RBC 4.18* 12/19/2013 1136   RBC 4.4* 08/29/2013 0919   RBC 2.86* 04/19/2013 1015   HGB 13.2 12/19/2013 1136   HGB 13.3* 08/29/2013 0919   HCT 39.6 12/19/2013 1136   HCT 40.6* 08/29/2013 0919   PLT 204.0 12/19/2013 1136   MCV 94.8 12/19/2013 1136   MCV 91.9 08/29/2013 0919   MCH 30.1 08/29/2013 0919   MCH 30.6 04/22/2013 0424  MCHC 33.4 12/19/2013 1136   MCHC 32.7 08/29/2013 0919   RDW 15.0 12/19/2013 1136   LYMPHSABS 1.2 12/19/2013 1136   MONOABS 1.1* 12/19/2013 1136   EOSABS 0.2 12/19/2013 1136   BASOSABS 0.1 12/19/2013 1136    Iron/TIBC/Ferritin/ %Sat    Component Value Date/Time   IRON 71 04/19/2013 1015   TIBC 224 04/19/2013 1015   FERRITIN 68 04/19/2013 1015   IRONPCTSAT 32 04/19/2013 1015        Assessment &  Plan:  80 year old male with a past medical history of GERD with esophageal ulceration, history of upper GI bleed from esophageal ulceration, history of hiatal hernia status post repair with recurrent hiatal hernia, pituitary tumor on bromocriptine, normal pressure hydrocephalus, history of CAD, COPD who is seen in followup.   1. GERD/ chronic lower esophageal ulcer/history of hiatus hernia status post repair with small recurrent hernia/history of upper GI bleed -- fortunately hemoglobin has been very stable. He is still having some indigestion and heartburn symptoms. This is a spike pantoprazole 40 mg daily. Given the chronicity of his ulcer, I have recommended repeating upper endoscopy to evaluate the lower esophagus. We discussed the risks and benefits of upper endoscopy and he is agreeable to proceed. He will continue pantoprazole 40 mg once daily. If the ulcer is still there we will discuss escalation of acid suppression therapy. I am adding liquid Carafate before meals and at bedtime which he can use on an as-needed basis.  2. Constipation -- improved with MiraLax. Continue MiraLax 17 g every other day  3. Anemia -- stable and no worse which is reassuring.  4. B12 deficiency -- continue monthly IM B12  5. Pituitary tumor -- closely followed by endocrine on bromocriptine  He has followup with Dr. Laurance [name] his PCP in the next several weeks.

## 2013-12-20 ENCOUNTER — Encounter: Payer: Self-pay | Admitting: Internal Medicine

## 2013-12-25 ENCOUNTER — Encounter: Payer: Self-pay | Admitting: Internal Medicine

## 2013-12-29 HISTORY — PX: UPPER GASTROINTESTINAL ENDOSCOPY: SHX188

## 2013-12-31 ENCOUNTER — Ambulatory Visit (INDEPENDENT_AMBULATORY_CARE_PROVIDER_SITE_OTHER): Payer: Medicare Other | Admitting: Family Medicine

## 2013-12-31 ENCOUNTER — Ambulatory Visit (INDEPENDENT_AMBULATORY_CARE_PROVIDER_SITE_OTHER): Payer: Medicare Other

## 2013-12-31 ENCOUNTER — Encounter: Payer: Self-pay | Admitting: Family Medicine

## 2013-12-31 VITALS — BP 106/69 | HR 63 | Temp 98.4°F | Ht 67.0 in | Wt 176.0 lb

## 2013-12-31 DIAGNOSIS — M542 Cervicalgia: Secondary | ICD-10-CM

## 2013-12-31 DIAGNOSIS — E785 Hyperlipidemia, unspecified: Secondary | ICD-10-CM

## 2013-12-31 DIAGNOSIS — E538 Deficiency of other specified B group vitamins: Secondary | ICD-10-CM

## 2013-12-31 DIAGNOSIS — E039 Hypothyroidism, unspecified: Secondary | ICD-10-CM

## 2013-12-31 DIAGNOSIS — K219 Gastro-esophageal reflux disease without esophagitis: Secondary | ICD-10-CM

## 2013-12-31 DIAGNOSIS — N400 Enlarged prostate without lower urinary tract symptoms: Secondary | ICD-10-CM

## 2013-12-31 DIAGNOSIS — E559 Vitamin D deficiency, unspecified: Secondary | ICD-10-CM

## 2013-12-31 DIAGNOSIS — G629 Polyneuropathy, unspecified: Secondary | ICD-10-CM

## 2013-12-31 DIAGNOSIS — I2510 Atherosclerotic heart disease of native coronary artery without angina pectoris: Secondary | ICD-10-CM

## 2013-12-31 LAB — POCT CBC
Granulocyte percent: 79 %G (ref 37–80)
HCT, POC: 39.2 % — AB (ref 43.5–53.7)
Hemoglobin: 13 g/dL — AB (ref 14.1–18.1)
Lymph, poc: 1.1 (ref 0.6–3.4)
MCH, POC: 31.3 pg — AB (ref 27–31.2)
MCHC: 33.1 g/dL (ref 31.8–35.4)
MCV: 94.7 fL (ref 80–97)
MPV: 8.6 fL (ref 0–99.8)
POC Granulocyte: 5.8 (ref 2–6.9)
POC LYMPH PERCENT: 15.3 %L (ref 10–50)
Platelet Count, POC: 198 10*3/uL (ref 142–424)
RBC: 4.1 M/uL — AB (ref 4.69–6.13)
RDW, POC: 14.7 %
WBC: 7.3 10*3/uL (ref 4.6–10.2)

## 2013-12-31 LAB — POCT UA - MICROSCOPIC ONLY
Bacteria, U Microscopic: NEGATIVE
Casts, Ur, LPF, POC: NEGATIVE
Crystals, Ur, HPF, POC: NEGATIVE
Mucus, UA: NEGATIVE
RBC, urine, microscopic: NEGATIVE
WBC, Ur, HPF, POC: NEGATIVE
Yeast, UA: NEGATIVE

## 2013-12-31 LAB — POCT URINALYSIS DIPSTICK
Bilirubin, UA: NEGATIVE
Blood, UA: NEGATIVE
Glucose, UA: NEGATIVE
Ketones, UA: NEGATIVE
Leukocytes, UA: NEGATIVE
Nitrite, UA: NEGATIVE
Protein, UA: NEGATIVE
Spec Grav, UA: 1.01
Urobilinogen, UA: NEGATIVE
pH, UA: 7.5

## 2013-12-31 NOTE — Progress Notes (Signed)
Subjective:    Patient ID: [name], male    DOB: Jan 24, 1933, 80 y.o.   MRN: [id number]  HPI Pt here for follow up and management of chronic medical problems. Please see the review of systems. The patient is complaining of left-sided headache and neck pain. Is also complaining of left shoulder pain and some right jaw pain. He doesn't need any medications refill. He is due to get lab work today. He has seen the urologist recently.        Patient Active Problem List   Diagnosis Date Noted  . Abnormality of gait 06/07/2013  . Esophageal ulcer with bleeding 04/20/2013  . Acute respiratory failure 04/18/2013  . S/P left and ritght THA, AA 07/21/2011  . Special screening for malignant neoplasms, colon 01/03/2011  . Personal history of colonic polyps 01/03/2011  . COPD GOLD I 08/24/2010  . GI BLEED 11/17/2009  . ESOPHAGEAL REFLUX 08/25/2009  . BLOOD IN STOOL-MELENA 08/25/2009  . COLONIC POLYPS 07/17/2008  . ANEMIA 07/17/2008  . HYPOTENSION 07/17/2008  . ESOPHAGITIS 07/17/2008  . DIVERTICULOSIS, MILD 07/17/2008  . DEGENERATIVE JOINT DISEASE 07/17/2008  . OSTEOPOROSIS 07/17/2008  . BENIGN PROSTATIC HYPERTROPHY, HX OF 07/17/2008  . HYPERPROLACTINEMIA 10/24/2007  . PITUITARY ADENOMA 06/25/2007  . HYPOTHYROIDISM 06/25/2007  . HYPERLIPIDEMIA 06/22/2007  . CORONARY ARTERY DISEASE 06/22/2007   Outpatient Encounter Prescriptions as of 12/31/2013  Medication Sig  . albuterol (PROVENTIL HFA;VENTOLIN HFA) 108 (90 BASE) MCG/ACT inhaler Inhale 2 puffs into the lungs every 6 (six) hours as needed for wheezing or shortness of breath.  . bromocriptine (PARLODEL) 2.5 MG tablet Take 1 tablet (2.5 mg total) by mouth 2 (two) times daily.  . budesonide-formoterol (SYMBICORT) 160-4.5 MCG/ACT inhaler Inhale 2 puffs into the lungs 2 (two) times daily.  . calcium carbonate (OS-CAL) 600 MG TABS tablet Take 600 mg by mouth 2 (two) times daily with a meal.  . cholecalciferol (VITAMIN D) 1000 UNITS  tablet Take 1,000 Units by mouth 2 (two) times daily.  . cyanocobalamin 100 MCG tablet Take 100 mcg by mouth daily.  . ferrous sulfate 325 (65 FE) MG tablet Take 325 mg by mouth daily.   Marland Kitchen guaiFENesin (MUCINEX) 600 MG 12 hr tablet Take 600 mg by mouth 2 (two) times daily.   Marland Kitchen levothyroxine (SYNTHROID, LEVOTHROID) 50 MCG tablet TAKE 1 TABLET IN THE MORNING  . magnesium oxide (MAG-OX) 400 MG tablet Take 400 mg by mouth daily.  . montelukast (SINGULAIR) 10 MG tablet TAKE 1 TABLET DAILY  . pantoprazole (PROTONIX) 40 MG tablet Take 1 tablet (40 mg total) by mouth daily.  . polyethylene glycol (MIRALAX / GLYCOLAX) packet Take 17 g by mouth daily.  . sucralfate (CARAFATE) 1 GM/10ML suspension Take one dose before meals and at bedtime (4 times a day)  . tamsulosin (FLOMAX) 0.4 MG CAPS capsule Take 1 capsule (0.4 mg total) by mouth daily.  Marland Kitchen tetrahydrozoline (VISINE) 0.05 % ophthalmic solution Place 1 drop into both eyes as needed (For dry eyes).  . traMADol (ULTRAM) 50 MG tablet Take 50 mg by mouth every 6 (six) hours as needed for pain.  . vitamin E (VITAMIN E) 400 UNIT capsule Take 400 Units by mouth daily.  . [DISCONTINUED] pravastatin (PRAVACHOL) 20 MG tablet Take 20 mg by mouth daily.  . nitroGLYCERIN (NITROSTAT) 0.4 MG SL tablet Place 0.4 mg under the tongue every 5 (five) minutes as needed for chest pain.  . [DISCONTINUED] doxycycline (VIBRA-TABS) 100 MG tablet Take 1 tablet (100 mg  total) by mouth 2 (two) times daily. Take with food  . [DISCONTINUED] cyanocobalamin ((VITAMIN B-12)) injection 1,000 mcg     Review of Systems  Constitutional: Negative.   HENT:       Right side jaw pain at times  Eyes: Negative.   Respiratory: Negative.   Cardiovascular: Negative.   Gastrointestinal: Negative.   Endocrine: Negative.   Genitourinary: Negative.   Musculoskeletal: Positive for arthralgias (left shoulder/ collar bone area pain).       Left side of neck pains at times  Skin: Negative.     Allergic/Immunologic: Negative.   Neurological: Positive for headaches (left sided HA's at times).  Hematological: Negative.   Psychiatric/Behavioral: Negative.        Objective:   Physical Exam  Constitutional: He is oriented to person, place, and time. He appears well-developed and well-nourished. No distress.  The patient is alert cooperative and in very with it intelligence Wise for his age.  HENT:  Head: Normocephalic and atraumatic.  Right Ear: External ear normal.  Left Ear: External ear normal.  Mouth/Throat: Oropharynx is clear and moist. No oropharyngeal exudate.  Nasal congestion bilaterally  Eyes: Conjunctivae and EOM are normal. Pupils are equal, round, and reactive to light. Right eye exhibits no discharge. Left eye exhibits no discharge. No scleral icterus.  Neck: Normal range of motion. Neck supple. No thyromegaly present.  No carotid bruits  Cardiovascular: Normal rate, regular rhythm, normal heart sounds and intact distal pulses.  Exam reveals no gallop and no friction rub.   No murmur heard. At 72/m  Pulmonary/Chest: Effort normal and breath sounds normal. No respiratory distress. He has no wheezes. He has no rales. He exhibits no tenderness.  Minimal congestion with coughing. No wheezing.No axillary adenopathy  Abdominal: Soft. Bowel sounds are normal. He exhibits no mass. There is no tenderness. There is no rebound and no guarding.  Genitourinary: Rectum normal and penis normal.  The prostate is minimally enlarged. There are no prostate masses. There is no rectal masses. There is no inguinal hernia palpated. The external genitalia were within normal limits.  Musculoskeletal: Normal range of motion. He exhibits no edema or tenderness.  Lymphadenopathy:    He has no cervical adenopathy.  Neurological: He is alert and oriented to person, place, and time. He has normal reflexes. No cranial nerve deficit.  Upper and lower extremity reflexes were brisk and active and  equal bilaterally  Skin: Skin is warm and dry. No rash noted. No erythema. No pallor.  Psychiatric: He has a normal mood and affect. His behavior is normal. Judgment and thought content normal.  Nursing note and vitals reviewed.  BP 106/69 mmHg  Pulse 63  Temp(Src) 98.4 F (36.9 C) (Oral)  Ht $R'5\' 7"'bf$  (1.702 m)  Wt 176 lb (79.833 kg)  BMI 27.56 kg/m2  [facility] reading (PRIMARY) by  Dr.Moore-C-spine-- significant degenerative changes in cervical spine with spurring                                       Assessment & Plan:  1. Vitamin D deficiency - POCT CBC - Vit D  25 hydroxy (rtn osteoporosis monitoring)  2. Gastroesophageal reflux disease, esophagitis presence not specified - POCT CBC  3. ASCVD (arteriosclerotic cardiovascular disease) - POCT CBC - BMP8+EGFR - Hepatic function panel  4. BPH (benign prostatic hypertrophy) - POCT CBC - PSA, total and free - POCT UA -  Microscopic Only - POCT urinalysis dipstick  5. Hyperlipemia - POCT CBC - BMP8+EGFR - Hepatic function panel - NMR, lipoprofile  6. Hypothyroidism, unspecified hypothyroidism type - POCT CBC  7. Vitamin B 12 deficiency - Vitamin B12  8. Neck pain, bilateral - DG Cervical Spine Complete; Future  9. Neuropathy   Patient Instructions                       Medicare Annual Wellness Visit  [facility] and the medical providers at [facility] strive to bring you the best medical care.  In doing so we not only want to address your current medical conditions and concerns but also to detect new conditions early and prevent illness, disease and health-related problems.    Medicare offers a yearly Wellness Visit which allows our clinical staff to assess your need for preventative services including immunizations, lifestyle education, counseling to decrease risk of preventable diseases and screening for fall risk and other medical concerns.    This visit is provided free of charge (no  copay) for all Medicare recipients. The clinical pharmacists at [facility] have begun to conduct these Wellness Visits which will also include a thorough review of all your medications.    As you primary medical provider recommend that you make an appointment for your Annual Wellness Visit if you have not done so already this year.  You may set up this appointment before you leave today or you may call back ([phone]) and schedule an appointment.  Please make sure when you call that you mention that you are scheduling your Annual Wellness Visit with the clinical pharmacist so that the appointment may be made for the proper length of time.     Continue current medications. Continue good therapeutic lifestyle changes which include good diet and exercise. Fall precautions discussed with patient. If an FOBT was given today- please return it to our front desk. If you are over 66 years old - you may need Prevnar 70 or the adult Pneumonia vaccine.  Flu Shots will be available at our office starting mid- September. Please call and schedule a FLU CLINIC APPOINTMENT.   Please use your head and making decisions about what you should do and shouldn't do, and Theodus't put yourself at risk of hurting your spine and Ewen't put herself at risk for falling Continue to drink plenty of fluids Take Tylenol as needed for pain Use traction for your neck as you have been doing Use warm wet compresses   Arrie Senate MD

## 2013-12-31 NOTE — Patient Instructions (Addendum)
Medicare Annual Wellness Visit  [facility] and the medical providers at [facility] strive to bring you the best medical care.  In doing so we not only want to address your current medical conditions and concerns but also to detect new conditions early and prevent illness, disease and health-related problems.    Medicare offers a yearly Wellness Visit which allows our clinical staff to assess your need for preventative services including immunizations, lifestyle education, counseling to decrease risk of preventable diseases and screening for fall risk and other medical concerns.    This visit is provided free of charge (no copay) for all Medicare recipients. The clinical pharmacists at [facility] have begun to conduct these Wellness Visits which will also include a thorough review of all your medications.    As you primary medical provider recommend that you make an appointment for your Annual Wellness Visit if you have not done so already this year.  You may set up this appointment before you leave today or you may call back ([phone]) and schedule an appointment.  Please make sure when you call that you mention that you are scheduling your Annual Wellness Visit with the clinical pharmacist so that the appointment may be made for the proper length of time.     Continue current medications. Continue good therapeutic lifestyle changes which include good diet and exercise. Fall precautions discussed with patient. If an FOBT was given today- please return it to our front desk. If you are over 36 years old - you may need Prevnar 31 or the adult Pneumonia vaccine.  Flu Shots will be available at our office starting mid- September. Please call and schedule a FLU CLINIC APPOINTMENT.   Please use your head and making decisions about what you should do and shouldn't do, and Legrande't put yourself at risk of hurting your spine and Blanca't put  herself at risk for falling Continue to drink plenty of fluids Take Tylenol as needed for pain Use traction for your neck as you have been doing Use warm wet compresses

## 2014-01-01 LAB — NMR, LIPOPROFILE
Cholesterol: 193 mg/dL (ref 100–199)
HDL Cholesterol by NMR: 48 mg/dL (ref >39)
HDL Particle Number: 26.2 umol/L — ABNORMAL LOW (ref >=30.5)
LDL Particle Number: 1320 nmol/L — ABNORMAL HIGH (ref <1000)
LDL Size: 21.6 nm (ref >20.5)
LDL-C: 128 mg/dL — ABNORMAL HIGH (ref 0–99)
LP-IR Score: 26 (ref <=45)
Small LDL Particle Number: 272 nmol/L (ref <=527)
Triglycerides by NMR: 84 mg/dL (ref 0–149)

## 2014-01-01 LAB — VITAMIN D 25 HYDROXY (VIT D DEFICIENCY, FRACTURES): Vit D, 25-Hydroxy: 34.6 ng/mL (ref 30.0–100.0)

## 2014-01-01 LAB — HEPATIC FUNCTION PANEL
ALT: 14 IU/L (ref 0–44)
AST: 21 IU/L (ref 0–40)
Albumin: 3.6 g/dL (ref 3.5–4.8)
Alkaline Phosphatase: 73 IU/L (ref 39–117)
Bilirubin, Direct: 0.12 mg/dL (ref 0.00–0.40)
Total Bilirubin: 0.5 mg/dL (ref 0.0–1.2)
Total Protein: 6.3 g/dL (ref 6.0–8.5)

## 2014-01-01 LAB — BMP8+EGFR
BUN/Creatinine Ratio: 9 — ABNORMAL LOW (ref 10–22)
BUN: 10 mg/dL (ref 8–27)
CO2: 23 mmol/L (ref 18–29)
Calcium: 8.7 mg/dL (ref 8.6–10.2)
Chloride: 104 mmol/L (ref 97–108)
Creatinine, Ser: 1.07 mg/dL (ref 0.76–1.27)
GFR calc Af Amer: 76 mL/min/{1.73_m2} (ref >59)
GFR calc non Af Amer: 66 mL/min/{1.73_m2} (ref >59)
Glucose: 91 mg/dL (ref 65–99)
Potassium: 4.2 mmol/L (ref 3.5–5.2)
Sodium: 140 mmol/L (ref 134–144)

## 2014-01-01 LAB — PSA, TOTAL AND FREE
PSA, Free Pct: 11.5 %
PSA, Free: 0.61 ng/mL
PSA: 5.3 ng/mL — ABNORMAL HIGH (ref 0.0–4.0)

## 2014-01-01 LAB — VITAMIN B12: Vitamin B-12: 1118 pg/mL — ABNORMAL HIGH (ref 211–946)

## 2014-01-02 ENCOUNTER — Encounter: Payer: Self-pay | Admitting: Family Medicine

## 2014-01-02 ENCOUNTER — Telehealth: Payer: Self-pay

## 2014-01-02 NOTE — Telephone Encounter (Signed)
-----   Message from Chipper Herb, MD sent at 12/31/2013  3:03 PM EST ----- As per radiology report---please give findings with impression--diffuse degenerative disc disease with multilevel neural foraminal narrowing

## 2014-01-02 NOTE — Telephone Encounter (Signed)
Pt aware of cervical spine x-ray results

## 2014-01-02 NOTE — Telephone Encounter (Signed)
[facility] to call x-ray

## 2014-01-15 ENCOUNTER — Encounter: Payer: Self-pay | Admitting: Internal Medicine

## 2014-01-27 ENCOUNTER — Ambulatory Visit (AMBULATORY_SURGERY_CENTER): Payer: Medicare Other | Admitting: Internal Medicine

## 2014-01-27 ENCOUNTER — Encounter: Payer: Self-pay | Admitting: Internal Medicine

## 2014-01-27 VITALS — BP 142/83 | HR 67 | Temp 96.7°F | Resp 27 | Ht 67.0 in | Wt 176.0 lb

## 2014-01-27 DIAGNOSIS — K2100 Gastro-esophageal reflux disease with esophagitis, without bleeding: Secondary | ICD-10-CM

## 2014-01-27 DIAGNOSIS — K222 Esophageal obstruction: Secondary | ICD-10-CM

## 2014-01-27 DIAGNOSIS — K219 Gastro-esophageal reflux disease without esophagitis: Secondary | ICD-10-CM

## 2014-01-27 MED ORDER — SODIUM CHLORIDE 0.9 % IV SOLN: 500.0000 mL | INTRAVENOUS | Status: DC

## 2014-01-27 MED ORDER — PANTOPRAZOLE SODIUM 40 MG PO TBEC: 40.0000 mg | DELAYED_RELEASE_TABLET | Freq: Two times a day (BID) | ORAL | Status: DC

## 2014-01-27 NOTE — Op Note (Signed)
[facility]  [address] [place], [id number]   ENDOSCOPY PROCEDURE REPORT  PATIENT: Allen, [name]  MR#: [id number] BIRTHDATE: 1932-10-06 , 81  yrs. old GENDER: male ENDOSCOPIST: Jerene Bears, MD PROCEDURE DATE:  01/27/2014 PROCEDURE:  EGD w/ biopsy ASA CLASS:     Class III INDICATIONS:  heartburn, dysphagia, and history of esophagitis with esophageal ulceration. MEDICATIONS: Monitored anesthesia care and Propofol 100 mg IV, lidocaine 100 mg IV TOPICAL ANESTHETIC: none  DESCRIPTION OF PROCEDURE: After the risks benefits and alternatives of the procedure were thoroughly explained, informed consent was obtained.  The [id number] endoscope was introduced through the mouth and advanced to the second portion of the duodenum , Without limitations.  The instrument was slowly withdrawn as the mucosa was fully examined.   ESOPHAGUS: There was a ulcerated stricture 34 cm from the incisors. This area was narrowed and required mild pressure to traverse with the standard adult upper endoscope. The stricture is sitting above a hiatal hernia.  There was a superficial mucosal tear caused by the scope passage.  Multiple biopsies of the stricture was performed using cold forceps to exclude dysplasia.  No definite Barrett's change seen.  STOMACH: A 4 cm hiatal hernia was noted.   A patch of erosive gastritis (inflammation) was found on the greater curvature of the gastric body.  Multiple biopsies were performed using cold forceps.   DUODENUM: The duodenal mucosa showed no abnormalities in the bulb and 2nd part of the duodenum.  Retroflexed views revealed hiatal hernia.     The scope was then withdrawn from the patient and the procedure completed.  COMPLICATIONS: There were no immediate complications.    ENDOSCOPIC IMPRESSION: 1.   There was a stricture with ulceration 34 cm from the incisors; multiple biopsies were performed 2.   4 cm hiatal hernia 3.    Erosive gastritis (inflammation) was found on the greater curvature of the gastric body; multiple biopsies were performed 4.   The duodenal mucosa showed no abnormalities in the bulb and 2nd part of the duodenum  RECOMMENDATIONS: 1.  Increase pantoprazole to 40 mg twice daily 2.  Await pathology results 3.  Liquid Carafate (sucralfate) 1 gram before meals and at bedtime 4.  Soft diet  eSigned:  Jerene Bears, MD 2014-01-27 [phone]    CC:The Patient and Redge Gainer, MD  PATIENT NAME:  Allen, [name] MR#: [id number]

## 2014-01-27 NOTE — Progress Notes (Signed)
Report to PACU, RN, vss, BBS= Clear.  

## 2014-01-27 NOTE — Progress Notes (Signed)
Called to room to assist during endoscopic procedure.  Patient ID and intended procedure confirmed with present staff. Received instructions for my participation in the procedure from the performing physician.  

## 2014-01-27 NOTE — Patient Instructions (Addendum)
Discharge instructions given. Handouts on hiatal hernia and gastritis. Biopsies taken. Resume medications. YOU HAD AN ENDOSCOPIC PROCEDURE TODAY AT [facility]: Refer to the procedure report that was given to you for any specific questions about what was found during the examination.  If the procedure report does not answer your questions, please call your gastroenterologist to clarify.  If you requested that your care partner not be given the details of your procedure findings, then the procedure report has been included in a sealed envelope for you to review at your convenience later.  YOU SHOULD EXPECT: Some feelings of bloating in the abdomen. Passage of more gas than usual.  Walking can help get rid of the air that was put into your GI tract during the procedure and reduce the bloating. If you had a lower endoscopy (such as a colonoscopy or flexible sigmoidoscopy) you may notice spotting of blood in your stool or on the toilet paper. If you underwent a bowel prep for your procedure, then you may not have a normal bowel movement for a few days.  DIET: Your first meal following the procedure should be a light meal and then it is ok to progress to your normal diet.  A half-sandwich or bowl of soup is an example of a good first meal.  Heavy or fried foods are harder to digest and may make you feel nauseous or bloated.  Likewise meals heavy in dairy and vegetables can cause extra gas to form and this can also increase the bloating.  Drink plenty of fluids but you should avoid alcoholic beverages for 24 hours.  ACTIVITY: Your care partner should take you home directly after the procedure.  You should plan to take it easy, moving slowly for the rest of the day.  You can resume normal activity the day after the procedure however you should NOT DRIVE or use heavy machinery for 24 hours (because of the sedation medicines used during the test).    SYMPTOMS TO REPORT IMMEDIATELY: A  gastroenterologist can be reached at any hour.  During normal business hours, 8:30 AM to 5:00 PM Monday through Friday, call [phone].  After hours and on weekends, please call the GI answering service at [phone] who will take a message and have the physician on call contact you.   Following upper endoscopy (EGD)  Vomiting of blood or coffee ground material  New chest pain or pain under the shoulder blades  Painful or persistently difficult swallowing  New shortness of breath  Fever of 100F or higher  Black, tarry-looking stools  FOLLOW UP: If any biopsies were taken you will be contacted by phone or by letter within the next 1-3 weeks.  Call your gastroenterologist if you have not heard about the biopsies in 3 weeks.  Our staff will call the home number listed on your records the next business day following your procedure to check on you and address any questions or concerns that you may have at that time regarding the information given to you following your procedure. This is a courtesy call and so if there is no answer at the home number and we have not heard from you through the emergency physician on call, we will assume that you have returned to your regular daily activities without incident.  SIGNATURES/CONFIDENTIALITY: You and/or your care partner have signed paperwork which will be entered into your electronic medical record.  These signatures attest to the fact that that the information above on  your After Visit Summary has been reviewed and is understood.  Full responsibility of the confidentiality of this discharge information lies with you and/or your care-partner.

## 2014-01-28 ENCOUNTER — Encounter: Payer: Self-pay | Admitting: Neurology

## 2014-01-28 ENCOUNTER — Telehealth: Payer: Self-pay | Admitting: *Deleted

## 2014-01-28 ENCOUNTER — Encounter: Payer: Self-pay | Admitting: Family Medicine

## 2014-01-28 NOTE — Telephone Encounter (Signed)
  Follow up Call-  Call back number 01/27/2014  Post procedure Call Back phone  # [phone] hm  Permission to leave phone message Yes     Patient questions:  Do you have a fever, pain , or abdominal swelling? No. Pain Score  0 *  Have you tolerated food without any problems? Yes.    Have you been able to return to your normal activities? Yes.    Do you have any questions about your discharge instructions: Diet   No. Medications  No. Follow up visit  No.  Do you have questions or concerns about your Care? Yes.    Actions: * If pain score is 4 or above: No action needed, pain <4.  Pt. States he had headache and chills las night.  Denies any fever.  He ate last night without any difficulty.  States he had a "coughing spell" last evening, no blood observed from this.  Advised I'd let Dr. Hilarie Fredrickson know.  Encouraged him to call us back if he develops fever, increased difficulty swallowing, or notes any blood with coughing.

## 2014-01-28 NOTE — Telephone Encounter (Signed)
Agree with your assessment. Please have him monitor for fever, chills, or coughing and if this is present he will need chest x-ray and be seen by [place] or PCP

## 2014-01-29 ENCOUNTER — Encounter: Payer: Self-pay | Admitting: Neurology

## 2014-01-29 NOTE — Telephone Encounter (Signed)
Spoke with pt.  He stated he has no problem relating to procedure 11/30.

## 2014-02-03 ENCOUNTER — Encounter: Payer: Self-pay | Admitting: Family Medicine

## 2014-02-03 ENCOUNTER — Ambulatory Visit (INDEPENDENT_AMBULATORY_CARE_PROVIDER_SITE_OTHER): Payer: Medicare Other | Admitting: Family Medicine

## 2014-02-03 ENCOUNTER — Encounter: Payer: Self-pay | Admitting: Internal Medicine

## 2014-02-03 VITALS — BP 108/66 | HR 65 | Temp 96.9°F | Ht 67.0 in | Wt 178.4 lb

## 2014-02-03 DIAGNOSIS — M79672 Pain in left foot: Secondary | ICD-10-CM

## 2014-02-03 DIAGNOSIS — M2142 Flat foot [pes planus] (acquired), left foot: Secondary | ICD-10-CM

## 2014-02-03 DIAGNOSIS — M79671 Pain in right foot: Secondary | ICD-10-CM

## 2014-02-03 DIAGNOSIS — M2141 Flat foot [pes planus] (acquired), right foot: Secondary | ICD-10-CM

## 2014-02-03 NOTE — Progress Notes (Signed)
Subjective:    Patient ID: [name], male    DOB: 01-Aug-1932, 80 y.o.   MRN: [id number]  HPI Pt is here today for evaluation for orthotics.  Pt had major reconstruction bilaterally, left foot in 1994 and right foot was done in 2004 by Dr Applington. He has had orthotics from the past and they're beginning to wear out. He is having increasing pain and more problems with walking because of the need for more orthotics.   Review of Systems Patient Active Problem List   Diagnosis Date Noted  . Neck pain, bilateral 12/31/2013  . Neuropathy 12/31/2013  . Vitamin B 12 deficiency 12/31/2013  . Abnormality of gait 06/07/2013  . Esophageal ulcer with bleeding 04/20/2013  . S/P left and ritght THA, AA 07/21/2011  . Special screening for malignant neoplasms, colon 01/03/2011  . Personal history of colonic polyps 01/03/2011  . COPD GOLD I 08/24/2010  . GI BLEED 11/17/2009  . ESOPHAGEAL REFLUX 08/25/2009  . BLOOD IN STOOL-MELENA 08/25/2009  . COLONIC POLYPS 07/17/2008  . ANEMIA 07/17/2008  . HYPOTENSION 07/17/2008  . ESOPHAGITIS 07/17/2008  . DIVERTICULOSIS, MILD 07/17/2008  . DEGENERATIVE JOINT DISEASE 07/17/2008  . OSTEOPOROSIS 07/17/2008  . BENIGN PROSTATIC HYPERTROPHY, HX OF 07/17/2008  . HYPERPROLACTINEMIA 10/24/2007  . PITUITARY ADENOMA 06/25/2007  . HYPOTHYROIDISM 06/25/2007  . HYPERLIPIDEMIA 06/22/2007  . CORONARY ARTERY DISEASE 06/22/2007   Outpatient Encounter Prescriptions as of 02/03/2014  Medication Sig  . albuterol (PROVENTIL HFA;VENTOLIN HFA) 108 (90 BASE) MCG/ACT inhaler Inhale 2 puffs into the lungs every 6 (six) hours as needed for wheezing or shortness of breath.  . bromocriptine (PARLODEL) 2.5 MG tablet Take 1 tablet (2.5 mg total) by mouth 2 (two) times daily.  . budesonide-formoterol (SYMBICORT) 160-4.5 MCG/ACT inhaler Inhale 2 puffs into the lungs 2 (two) times daily.  . calcium carbonate (OS-CAL) 600 MG TABS tablet Take 600 mg by mouth 2 (two) times daily  with a meal.  . cholecalciferol (VITAMIN D) 1000 UNITS tablet Take 1,000 Units by mouth 2 (two) times daily.  . cyanocobalamin 100 MCG tablet Take 100 mcg by mouth daily.  . ferrous sulfate 325 (65 FE) MG tablet Take 325 mg by mouth daily.   Marland Kitchen guaiFENesin (MUCINEX) 600 MG 12 hr tablet Take 600 mg by mouth 2 (two) times daily.   Marland Kitchen levothyroxine (SYNTHROID, LEVOTHROID) 50 MCG tablet TAKE 1 TABLET IN THE MORNING  . magnesium oxide (MAG-OX) 400 MG tablet Take 400 mg by mouth daily.  . montelukast (SINGULAIR) 10 MG tablet TAKE 1 TABLET DAILY  . nitroGLYCERIN (NITROSTAT) 0.4 MG SL tablet Place 0.4 mg under the tongue every 5 (five) minutes as needed for chest pain.  . pantoprazole (PROTONIX) 40 MG tablet Take 1 tablet (40 mg total) by mouth 2 (two) times daily before a meal.  . polyethylene glycol (MIRALAX / GLYCOLAX) packet Take 17 g by mouth daily.  . sucralfate (CARAFATE) 1 GM/10ML suspension Take one dose before meals and at bedtime (4 times a day)  . tamsulosin (FLOMAX) 0.4 MG CAPS capsule Take 1 capsule (0.4 mg total) by mouth daily.  Marland Kitchen tetrahydrozoline (VISINE) 0.05 % ophthalmic solution Place 1 drop into both eyes as needed (For dry eyes).  . traMADol (ULTRAM) 50 MG tablet Take 50 mg by mouth every 6 (six) hours as needed for pain.  . vitamin E (VITAMIN E) 400 UNIT capsule Take 400 Units by mouth daily.      Objective:   Physical Exam  Constitutional: He  is oriented to person, place, and time. He appears well-developed and well-nourished. No distress.  Musculoskeletal: Normal range of motion. He exhibits tenderness. He exhibits no edema.  There are various tender spaces on both feet with palpation. He has good pulses bilaterally and sensation is intact bilaterally. He has lost his arch support in both feet and has begun to develop a callus in the posterior plantar aspect of the left heel.  Neurological: He is alert and oriented to person, place, and time.  Skin: Skin is warm and dry. No  rash noted.  Psychiatric: He has a normal mood and affect. His behavior is normal. Judgment and thought content normal.  Nursing note and vitals reviewed.  BP 108/66 mmHg  Pulse 65  Temp(Src) 96.9 F (36.1 C) (Oral)  Ht 5\' 7"  (1.702 m)  Wt 178 lb 6.4 oz (80.922 kg)  BMI 27.93 kg/m2        Assessment & Plan:  1. Bilateral foot pain  2. Pes planus of both feet   Patient Instructions  New orthotics for both feet   Arrie Senate MD

## 2014-02-03 NOTE — Patient Instructions (Signed)
New orthotics for both feet

## 2014-02-04 ENCOUNTER — Encounter: Payer: Self-pay | Admitting: Internal Medicine

## 2014-02-07 ENCOUNTER — Encounter: Payer: Self-pay | Admitting: Internal Medicine

## 2014-02-07 ENCOUNTER — Other Ambulatory Visit: Payer: Self-pay

## 2014-02-07 MED ORDER — SUCRALFATE 1 G PO TABS: 1.0000 g | ORAL_TABLET | Freq: Three times a day (TID) | ORAL | Status: DC

## 2014-02-10 ENCOUNTER — Other Ambulatory Visit: Payer: Self-pay | Admitting: Endocrinology

## 2014-02-12 ENCOUNTER — Encounter: Payer: Self-pay | Admitting: Internal Medicine

## 2014-02-13 ENCOUNTER — Encounter: Payer: Self-pay | Admitting: Neurology

## 2014-02-13 ENCOUNTER — Encounter: Payer: Self-pay | Admitting: Internal Medicine

## 2014-02-13 ENCOUNTER — Encounter: Payer: Self-pay | Admitting: Family Medicine

## 2014-03-04 ENCOUNTER — Encounter: Payer: Self-pay | Admitting: Family Medicine

## 2014-03-04 ENCOUNTER — Encounter: Payer: Self-pay | Admitting: Neurology

## 2014-03-04 DIAGNOSIS — M25512 Pain in left shoulder: Secondary | ICD-10-CM

## 2014-03-05 ENCOUNTER — Other Ambulatory Visit (INDEPENDENT_AMBULATORY_CARE_PROVIDER_SITE_OTHER): Payer: Medicare Other

## 2014-03-05 DIAGNOSIS — M25512 Pain in left shoulder: Secondary | ICD-10-CM

## 2014-03-10 ENCOUNTER — Other Ambulatory Visit: Payer: Self-pay | Admitting: Family Medicine

## 2014-03-10 DIAGNOSIS — M542 Cervicalgia: Secondary | ICD-10-CM | POA: Diagnosis not present

## 2014-03-10 DIAGNOSIS — M25512 Pain in left shoulder: Secondary | ICD-10-CM | POA: Diagnosis not present

## 2014-03-11 ENCOUNTER — Encounter: Payer: Self-pay | Admitting: Neurology

## 2014-03-11 ENCOUNTER — Ambulatory Visit (INDEPENDENT_AMBULATORY_CARE_PROVIDER_SITE_OTHER): Payer: Medicare Other | Admitting: Neurology

## 2014-03-11 VITALS — BP 130/81 | HR 59 | Temp 97.0°F | Ht 67.0 in | Wt 176.0 lb

## 2014-03-11 DIAGNOSIS — D352 Benign neoplasm of pituitary gland: Secondary | ICD-10-CM | POA: Diagnosis not present

## 2014-03-11 DIAGNOSIS — R269 Unspecified abnormalities of gait and mobility: Secondary | ICD-10-CM

## 2014-03-11 DIAGNOSIS — D353 Benign neoplasm of craniopharyngeal duct: Secondary | ICD-10-CM

## 2014-03-11 NOTE — Progress Notes (Signed)
Reason for visit: gait disorder  [name] is an 81 y.o. male  History of present illness:  [name] is a 81 year old right-handed white male with a history of a gait disorder. The patient has a pituitary macroadenoma that is a prolactin secreting tumor. The patient is on bromocriptine for this. He continues to have good days and bad days with balance. If he is standing still, he indicates that his balance may be more difficult to maintain. He does fairly well when he walks. He does better when he looks at the ground when he walks. He uses a cane for ambulation. He has not had any falls since last seen. He believes that his cognitive status and his walking abilities have remained stable. He may have periods of time when he feels "cloudy headed". He generally feels better cognitively in the morning. He has had some crepitus in the neck, and x-rays of shown some cervical arthritis. He recently had an injection in the left shoulder for arthritis. The patient reports some intermittent numbness and tingling in the left hand, usually noticeable when driving a car. He returns to the office today for an evaluation. He was told that his prolactin level significant increased in September 2015. His bromocriptine dose has been increased, but he cannot tolerate a higher dose well because of some problems with drowsiness and cognitive side effects.  Past Medical History  Diagnosis Date  . Emphysema   . CAD (coronary artery disease)     left main had  a 20% stenosis.  Left anterior descending had 40-50% proximal   stenosis.  Left circumflex had an 80% proximal stenosis.  The  first obtuse marginal artery had a 60% mid stenosis.  The  right  coronary artery  had  a 50-60% proximal stenosis with a 60-70%  mid stenosis  The patient was managed medically.   Marland Kitchen COPD (chronic obstructive pulmonary disease)   . Hypothyroidism   . Anemia   . Blood transfusion june 2011, 5 units given    july 2011 some  units given  . Enlarged prostate     elevated psa recently  . GERD (gastroesophageal reflux disease)   . Arthritis   . Complication of anesthesia     likes spinal due to copd  . Dizziness     occasional  . Shortness of breath     with exertion  . Mass, scrotum     L scrotum - Sees Dr. Jeffie Pollock  . Abnormality of gait 06/07/2013  . Pituitary macroadenoma   . Restless legs syndrome (RLS)   . Blood transfusion without reported diagnosis   . Allergy   . Asthma   . Cataract     bilateral removal of cateracts  . Depression   . Emphysema of lung   . Oxygen deficiency     has o2 at home if needed    Past Surgical History  Procedure Laterality Date  . Coronary stent placement  5 yrs ago  . Hiatal hernia repair  01-04-2008  . Foot surgery  1994 left, 2002 right foot    bilateral foot reconstruciton  . Abdominal hernia repair   2008  . Cataract extraction both eyes    . Total hip arthroplasty  07/21/2011    Procedure: TOTAL HIP ARTHROPLASTY ANTERIOR APPROACH;  Surgeon: Mauri Pole, MD;  Location: [facility];  Service: Orthopedics;  Laterality: Left;  . Coronary angioplasty    . Total hip arthroplasty Right 09/07/2012  Procedure: RIGHT TOTAL HIP ARTHROPLASTY ANTERIOR APPROACH;  Surgeon: Mcarthur Rossetti, MD;  Location: [facility];  Service: Orthopedics;  Laterality: Right;  . Foot surgery      reconstruction of both feet  . Esophagogastroduodenoscopy N/A 04/20/2013    Procedure: ESOPHAGOGASTRODUODENOSCOPY (EGD);  Surgeon: Inda Castle, MD;  Location: [facility];  Service: Endoscopy;  Laterality: N/A;  . Colonoscopy    . Upper gastrointestinal endoscopy      Family History  Problem Relation Age of Onset  . Heart disease Sister   . Heart failure Father   . Colon cancer Neg Hx   . Esophageal cancer Neg Hx   . Rectal cancer Neg Hx   . Stomach cancer Neg Hx     Social history:  reports that he has never smoked. He has never used smokeless tobacco. He reports that he does not  drink alcohol or use illicit drugs.    Allergies  Allergen Reactions  . Lortab [Hydrocodone-Acetaminophen] Nausea And Vomiting  . Penicillins Other (See Comments)    Light headed  . Zetia [Ezetimibe] Other (See Comments)    Muscle weakness  . Zocor [Simvastatin] Nausea Only and Other (See Comments)    muscle weakness    Medications:  Current Outpatient Prescriptions on File Prior to Visit  Medication Sig Dispense Refill  . albuterol (PROVENTIL HFA;VENTOLIN HFA) 108 (90 BASE) MCG/ACT inhaler Inhale 2 puffs into the lungs every 6 (six) hours as needed for wheezing or shortness of breath.    . bromocriptine (PARLODEL) 2.5 MG tablet Take 1 tablet (2.5 mg total) by mouth 2 (two) times daily. 60 tablet 11  . budesonide-formoterol (SYMBICORT) 160-4.5 MCG/ACT inhaler Inhale 2 puffs into the lungs 2 (two) times daily.    . calcium carbonate (OS-CAL) 600 MG TABS tablet Take 600 mg by mouth 2 (two) times daily with a meal.    . cholecalciferol (VITAMIN D) 1000 UNITS tablet Take 1,000 Units by mouth 2 (two) times daily.    . cyanocobalamin 100 MCG tablet Take 100 mcg by mouth daily.    . ferrous sulfate 325 (65 FE) MG tablet Take 325 mg by mouth daily.     Marland Kitchen guaiFENesin (MUCINEX) 600 MG 12 hr tablet Take 600 mg by mouth 2 (two) times daily.     Marland Kitchen levothyroxine (SYNTHROID, LEVOTHROID) 50 MCG tablet TAKE 1 TABLET IN THE MORNING 30 tablet 2  . magnesium oxide (MAG-OX) 400 MG tablet Take 400 mg by mouth daily.    . montelukast (SINGULAIR) 10 MG tablet TAKE 1 TABLET DAILY 30 tablet 3  . nitroGLYCERIN (NITROSTAT) 0.4 MG SL tablet Place 0.4 mg under the tongue every 5 (five) minutes as needed for chest pain.    . pantoprazole (PROTONIX) 40 MG tablet Take 1 tablet (40 mg total) by mouth 2 (two) times daily before a meal. 60 tablet 3  . polyethylene glycol (MIRALAX / GLYCOLAX) packet Take 17 g by mouth daily. 14 each 0  . sucralfate (CARAFATE) 1 G tablet Take 1 tablet (1 g total) by mouth 4 (four) times  daily -  with meals and at bedtime. 120 tablet 2  . tamsulosin (FLOMAX) 0.4 MG CAPS capsule Take 1 capsule (0.4 mg total) by mouth daily. 30 capsule 6  . tetrahydrozoline (VISINE) 0.05 % ophthalmic solution Place 1 drop into both eyes as needed (For dry eyes).    . traMADol (ULTRAM) 50 MG tablet Take 50 mg by mouth every 6 (six) hours as needed for pain.    Marland Kitchen  vitamin E (VITAMIN E) 400 UNIT capsule Take 400 Units by mouth daily.    Marland Kitchen doxycycline (VIBRA-TABS) 100 MG tablet TAKE (1) TABLET TWICE A DAY. 20 tablet 1   No current facility-administered medications on file prior to visit.    ROS:  Out of a complete 14 system review of symptoms, the patient complains only of the following symptoms, and all other reviewed systems are negative.  Easy bruising Joint pain Memory loss, confusion, numbness, dizziness Depression, anxiety, decreased energy, disinterest in activities  Blood pressure 130/81, pulse 59, temperature 97 F (36.1 C), temperature source Oral, height 5\' 7"  (1.702 m), weight 176 lb (79.833 kg).  Physical Exam  General: The patient is alert and cooperative at the time of the examination.  Skin: No significant peripheral edema is noted.   Neurologic Exam  Mental status: The patient is oriented x 3.  Cranial nerves: Facial symmetry is present. Speech is normal, no aphasia or dysarthria is noted. Extraocular movements are full. Visual fields are full.  Motor: The patient has good strength in all 4 extremities.  Sensory examination: soft touch sensation is symmetric on the face, arms, and legs.  Coordination: The patient has good finger-nose-finger and heel-to-shin bilaterally. Tinel sign is positive at the wrists bilaterally.  Gait and station: The patient has a minimally wide-based gait, the patient walks well with good stride with a cane. The patient demonstrates good turns. Tandem gait is unsteady.Romberg is negative. No drift is seen.  Reflexes: Deep tendon reflexes  are symmetric, but reflexes in the lower extremities are quite brisk.   Assessment/Plan:  1. Gait disorder  2. Pituitary macroadenoma, prolactin secreting  The patient is stable neurologically at this point. He will be sent for MRI evaluation of the brain to reevaluate the size of the pituitary adenoma. The patient has had a significant increase in the prolactin level recently. He likely has left sided carpal tunnel syndrome, but the patient does not wish to pursue evaluation for this at this time.  Jill Alexanders MD 03/11/2014 8:27 PM  [facility] [address] [place], [place] [address]  Phone [phone] Fax [phone]

## 2014-03-11 NOTE — Patient Instructions (Signed)

## 2014-03-17 ENCOUNTER — Encounter: Payer: Self-pay | Admitting: Family Medicine

## 2014-03-17 NOTE — Telephone Encounter (Signed)
Spoke with pt regarding blistered area  Area is improving He will come in for appt if sxs worsen

## 2014-03-22 ENCOUNTER — Inpatient Hospital Stay: Admission: RE | Admit: 2014-03-22 | Payer: Self-pay | Source: Ambulatory Visit

## 2014-03-27 ENCOUNTER — Encounter: Payer: Self-pay | Admitting: Neurology

## 2014-03-27 ENCOUNTER — Ambulatory Visit
Admission: RE | Admit: 2014-03-27 | Discharge: 2014-03-27 | Disposition: A | Discharge status: Home / Self Care | Payer: Medicare Other | Source: Ambulatory Visit | Attending: Neurology | Admitting: Neurology

## 2014-03-27 DIAGNOSIS — D352 Benign neoplasm of pituitary gland: Secondary | ICD-10-CM | POA: Diagnosis not present

## 2014-03-27 DIAGNOSIS — D353 Benign neoplasm of craniopharyngeal duct: Secondary | ICD-10-CM

## 2014-03-27 DIAGNOSIS — R269 Unspecified abnormalities of gait and mobility: Secondary | ICD-10-CM

## 2014-03-27 MED ORDER — GADOBENATE DIMEGLUMINE 529 MG/ML IV SOLN
8.0000 mL | Freq: Once | INTRAVENOUS | Status: AC | PRN
  Administered 2014-03-27: 8 mL via INTRAVENOUS

## 2014-03-28 ENCOUNTER — Telehealth: Payer: Self-pay | Admitting: Neurology

## 2014-03-28 NOTE — Telephone Encounter (Signed)
I called the patient. MRI scan shows no increase in size of the pituitary macroadenoma. I discussed this with the patient.   MRI brain/pituitary gland 03/28/2014:  IMPRESSION:  Abnormal MRI brain and pituitary (with and without) demonstrating: 1. Slightly hypoenhancing pituitary mass, slightly extending towards the left side, measuring 14x17x67mm (APxtransxSI). The mass extends towards the sphenoid sinus, and slightly into the left cavernous sinus. 2. No acute findings.  3. No significant change from MRI on 11/28/12.

## 2014-03-29 ENCOUNTER — Inpatient Hospital Stay: Admission: RE | Admit: 2014-03-29 | Payer: Self-pay | Source: Ambulatory Visit

## 2014-03-30 ENCOUNTER — Encounter: Payer: Self-pay | Admitting: Neurology

## 2014-04-02 ENCOUNTER — Other Ambulatory Visit: Payer: Self-pay | Admitting: Internal Medicine

## 2014-04-04 ENCOUNTER — Other Ambulatory Visit: Payer: Self-pay | Admitting: *Deleted

## 2014-04-04 MED ORDER — BUDESONIDE-FORMOTEROL FUMARATE 160-4.5 MCG/ACT IN AERO: 2.0000 | INHALATION_SPRAY | Freq: Two times a day (BID) | RESPIRATORY_TRACT | Status: DC

## 2014-04-11 ENCOUNTER — Encounter: Payer: Self-pay | Admitting: Internal Medicine

## 2014-04-15 ENCOUNTER — Other Ambulatory Visit: Payer: Self-pay | Admitting: Family Medicine

## 2014-04-15 NOTE — Telephone Encounter (Signed)
Hi Mr. Brassell Thanks for your message Is Mrs. Vanatta using the Reglan (metoclopramide) on a scheduled basis? And if so what dose? Will request labs to be done close to your home and be in touch with results  Best, Berenice Primas, Please order CBC, CMP, lipase to be performed at [facility] in [place] if possible Thanks

## 2014-04-22 ENCOUNTER — Encounter: Payer: Self-pay | Admitting: Internal Medicine

## 2014-04-23 NOTE — Telephone Encounter (Signed)
Mr. Lassen, I think this warrants an office visit. I'm going to bring her into see one of our advanced practitioner's on a day when I am in the office due to the lack of availability to see me directly You should hear from my office soon  Vaughan Basta, please schedule her to see Amy on a day when I am in clinic

## 2014-04-23 NOTE — Telephone Encounter (Signed)
Just to confirm receipt of initial message to [facility]

## 2014-04-27 ENCOUNTER — Encounter: Payer: Self-pay | Admitting: Family Medicine

## 2014-05-05 DIAGNOSIS — M25512 Pain in left shoulder: Secondary | ICD-10-CM | POA: Diagnosis not present

## 2014-05-05 DIAGNOSIS — M542 Cervicalgia: Secondary | ICD-10-CM | POA: Diagnosis not present

## 2014-05-15 ENCOUNTER — Ambulatory Visit (INDEPENDENT_AMBULATORY_CARE_PROVIDER_SITE_OTHER): Payer: Medicare Other | Admitting: Family Medicine

## 2014-05-15 ENCOUNTER — Encounter: Payer: Self-pay | Admitting: Family Medicine

## 2014-05-15 VITALS — BP 138/85 | HR 67 | Temp 96.6°F | Ht 67.0 in | Wt 177.0 lb

## 2014-05-15 DIAGNOSIS — K219 Gastro-esophageal reflux disease without esophagitis: Secondary | ICD-10-CM

## 2014-05-15 DIAGNOSIS — N400 Enlarged prostate without lower urinary tract symptoms: Secondary | ICD-10-CM

## 2014-05-15 DIAGNOSIS — I2510 Atherosclerotic heart disease of native coronary artery without angina pectoris: Secondary | ICD-10-CM

## 2014-05-15 DIAGNOSIS — Z8639 Personal history of other endocrine, nutritional and metabolic disease: Secondary | ICD-10-CM | POA: Diagnosis not present

## 2014-05-15 DIAGNOSIS — E559 Vitamin D deficiency, unspecified: Secondary | ICD-10-CM | POA: Diagnosis not present

## 2014-05-15 DIAGNOSIS — E039 Hypothyroidism, unspecified: Secondary | ICD-10-CM | POA: Diagnosis not present

## 2014-05-15 DIAGNOSIS — E785 Hyperlipidemia, unspecified: Secondary | ICD-10-CM | POA: Diagnosis not present

## 2014-05-15 DIAGNOSIS — Z87898 Personal history of other specified conditions: Secondary | ICD-10-CM

## 2014-05-15 LAB — POCT CBC
Granulocyte percent: 73.8 %G (ref 37–80)
HCT, POC: 40.9 % — AB (ref 43.5–53.7)
Hemoglobin: 13 g/dL — AB (ref 14.1–18.1)
Lymph, poc: 1.3 (ref 0.6–3.4)
MCH, POC: 30.5 pg (ref 27–31.2)
MCHC: 31.8 g/dL (ref 31.8–35.4)
MCV: 95.9 fL (ref 80–97)
MPV: 8.3 fL (ref 0–99.8)
POC Granulocyte: 5 (ref 2–6.9)
POC LYMPH PERCENT: 18.8 %L (ref 10–50)
Platelet Count, POC: 206 10*3/uL (ref 142–424)
RBC: 4.27 M/uL — AB (ref 4.69–6.13)
RDW, POC: 14.3 %
WBC: 6.8 10*3/uL (ref 4.6–10.2)

## 2014-05-15 MED ORDER — TAMSULOSIN HCL 0.4 MG PO CAPS: ORAL_CAPSULE | ORAL | Status: DC

## 2014-05-15 MED ORDER — BROMOCRIPTINE MESYLATE 2.5 MG PO TABS: 2.5000 mg | ORAL_TABLET | Freq: Two times a day (BID) | ORAL | Status: DC

## 2014-05-15 NOTE — Patient Instructions (Addendum)
Medicare Annual Wellness Visit  [facility] and the medical providers at [facility] strive to bring you the best medical care.  In doing so we not only want to address your current medical conditions and concerns but also to detect new conditions early and prevent illness, disease and health-related problems.    Medicare offers a yearly Wellness Visit which allows our clinical staff to assess your need for preventative services including immunizations, lifestyle education, counseling to decrease risk of preventable diseases and screening for fall risk and other medical concerns.    This visit is provided free of charge (no copay) for all Medicare recipients. The clinical pharmacists at [facility] have begun to conduct these Wellness Visits which will also include a thorough review of all your medications.    As you primary medical provider recommend that you make an appointment for your Annual Wellness Visit if you have not done so already this year.  You may set up this appointment before you leave today or you may call back [phone]) and schedule an appointment.  Please make sure when you call that you mention that you are scheduling your Annual Wellness Visit with the clinical pharmacist so that the appointment may be made for the proper length of time.     Continue current medications. Continue good therapeutic lifestyle changes which include good diet and exercise. Fall precautions discussed with patient. If an FOBT was given today- please return it to our front desk. If you are over 89 years old - you may need Prevnar 41 or the adult Pneumonia vaccine.  Flu Shots are still available at our office. If you still haven't had one please call to set up a nurse visit to get one.   After your visit with [place] today you will receive a survey in the mail or online from [employer] regarding your care with [place]. Please take a moment to  fill this out. Your feedback is very important to [place] as you can help [place] better understand your patient needs as well as improve your experience and satisfaction. WE CARE ABOUT YOU!!!   The patient should continue to take his medicines regularly and to get out of the house as much as possible with his wife so that he does not go stir crazy in the house all the time. He is encouraged to talk to me if he feels like he needs an abdominal depressant. He needs to drink plenty of fluids and avoid caffeine and avoid NSAIDs He should follow-up with cardiology and gastroenterology as planned

## 2014-05-15 NOTE — Progress Notes (Signed)
Subjective:    Patient ID: [name], male    DOB: 1932-09-18, 81 y.o.   MRN: [id number]  HPI Pt here for follow up and management of chronic medical problems which includes hypothyroid and hyperlipidemia. He is taking medications regularly. The patient is doing well today. We talked about his depression and being a caregiver and he currently is doing fine with this and has to take one day at a time and does not wish to have any medication for this. He denies chest pain shortness of breath heartburn with medication and he denies any black tarry stools. He has not had any falls or injuries.         Patient Active Problem List   Diagnosis Date Noted  . Neck pain, bilateral 12/31/2013  . Neuropathy 12/31/2013  . Vitamin B 12 deficiency 12/31/2013  . Abnormality of gait 06/07/2013  . Esophageal ulcer with bleeding 04/20/2013  . S/P left and ritght THA, AA 07/21/2011  . Special screening for malignant neoplasms, colon 01/03/2011  . Personal history of colonic polyps 01/03/2011  . COPD GOLD I 08/24/2010  . GI BLEED 11/17/2009  . ESOPHAGEAL REFLUX 08/25/2009  . BLOOD IN STOOL-MELENA 08/25/2009  . COLONIC POLYPS 07/17/2008  . ANEMIA 07/17/2008  . HYPOTENSION 07/17/2008  . ESOPHAGITIS 07/17/2008  . DIVERTICULOSIS, MILD 07/17/2008  . DEGENERATIVE JOINT DISEASE 07/17/2008  . OSTEOPOROSIS 07/17/2008  . BENIGN PROSTATIC HYPERTROPHY, HX OF 07/17/2008  . HYPERPROLACTINEMIA 10/24/2007  . PITUITARY ADENOMA 06/25/2007  . HYPOTHYROIDISM 06/25/2007  . HYPERLIPIDEMIA 06/22/2007  . CORONARY ARTERY DISEASE 06/22/2007   Outpatient Encounter Prescriptions as of 05/15/2014  Medication Sig  . albuterol (PROVENTIL HFA;VENTOLIN HFA) 108 (90 BASE) MCG/ACT inhaler Inhale 2 puffs into the lungs every 6 (six) hours as needed for wheezing or shortness of breath.  . bromocriptine (PARLODEL) 2.5 MG tablet Take 1 tablet (2.5 mg total) by mouth 2 (two) times daily.  . budesonide-formoterol (SYMBICORT)  160-4.5 MCG/ACT inhaler Inhale 2 puffs into the lungs 2 (two) times daily.  . calcium carbonate (OS-CAL) 600 MG TABS tablet Take 600 mg by mouth 2 (two) times daily with a meal.  . cholecalciferol (VITAMIN D) 1000 UNITS tablet Take 1,000 Units by mouth 2 (two) times daily.  . cyanocobalamin 100 MCG tablet Take 100 mcg by mouth daily.  Marland Kitchen doxycycline (VIBRA-TABS) 100 MG tablet TAKE (1) TABLET TWICE A DAY.  . ferrous sulfate 325 (65 FE) MG tablet Take 325 mg by mouth daily.   Marland Kitchen guaiFENesin (MUCINEX) 600 MG 12 hr tablet Take 600 mg by mouth 2 (two) times daily.   Marland Kitchen levothyroxine (SYNTHROID, LEVOTHROID) 50 MCG tablet TAKE 1 TABLET IN THE MORNING  . magnesium oxide (MAG-OX) 400 MG tablet Take 400 mg by mouth daily.  . montelukast (SINGULAIR) 10 MG tablet TAKE 1 TABLET DAILY  . nitroGLYCERIN (NITROSTAT) 0.4 MG SL tablet Place 0.4 mg under the tongue every 5 (five) minutes as needed for chest pain.  . pantoprazole (PROTONIX) 40 MG tablet Take 1 tablet (40 mg total) by mouth 2 (two) times daily before a meal.  . polyethylene glycol (MIRALAX / GLYCOLAX) packet Take 17 g by mouth daily.  . sucralfate (CARAFATE) 1 G tablet Take 1 tablet (1 g total) by mouth 4 (four) times daily -  with meals and at bedtime.  . tamsulosin (FLOMAX) 0.4 MG CAPS capsule Take 1 capsule (0.4 mg total) by mouth daily.  Marland Kitchen tetrahydrozoline (VISINE) 0.05 % ophthalmic solution Place 1 drop into both eyes  as needed (For dry eyes).  . traMADol (ULTRAM) 50 MG tablet Take 50 mg by mouth every 6 (six) hours as needed for pain.  . vitamin E (VITAMIN E) 400 UNIT capsule Take 400 Units by mouth daily.    Review of Systems  Constitutional: Negative.   HENT: Negative.   Eyes: Negative.   Respiratory: Negative.   Cardiovascular: Negative.   Gastrointestinal: Negative.   Endocrine: Negative.   Genitourinary: Negative.   Musculoskeletal: Negative.   Skin: Negative.   Allergic/Immunologic: Negative.   Neurological: Negative.     Hematological: Negative.   Psychiatric/Behavioral: Negative.        Objective:   Physical Exam  Constitutional: He is oriented to person, place, and time. He appears well-developed and well-nourished. No distress.  HENT:  Head: Normocephalic and atraumatic.  Right Ear: External ear normal.  Left Ear: External ear normal.  Nose: Nose normal.  Mouth/Throat: Oropharynx is clear and moist. No oropharyngeal exudate.  Eyes: Conjunctivae and EOM are normal. Pupils are equal, round, and reactive to light. Right eye exhibits no discharge. Left eye exhibits no discharge. No scleral icterus.  Neck: Normal range of motion. Neck supple. No thyromegaly present.  Cardiovascular: Normal rate, regular rhythm, normal heart sounds and intact distal pulses.   No murmur heard. At 60/m  Pulmonary/Chest: Effort normal and breath sounds normal. No respiratory distress. He has no wheezes. He has no rales. He exhibits no tenderness.  Lungs are actually very clear today.  Abdominal: Soft. Bowel sounds are normal. He exhibits no mass. There is no tenderness. There is no rebound and no guarding.  Nontender without masses or organ enlargement  Musculoskeletal: Normal range of motion. He exhibits no edema or tenderness.  The patient has flat feet and this is secondary to multiple surgeries on his feet in the past.  Lymphadenopathy:    He has no cervical adenopathy.  Neurological: He is alert and oriented to person, place, and time. He has normal reflexes. No cranial nerve deficit.  Skin: Skin is warm and dry. No rash noted. No erythema. No pallor.  Psychiatric: He has a normal mood and affect. His behavior is normal. Judgment and thought content normal.  Nursing note and vitals reviewed.  BP 138/85 mmHg  Pulse 67  Temp(Src) 96.6 F (35.9 C) (Oral)  Ht $R'5\' 7"'oK$  (1.702 m)  Wt 177 lb (80.287 kg)  BMI 27.72 kg/m2        Assessment & Plan:   1. Vitamin D deficiency -The patient should continue with his  current vitamin D pending results of lab work being drawn today. - POCT CBC - Vit D  25 hydroxy (rtn osteoporosis monitoring)  2. Gastroesophageal reflux disease, esophagitis presence not specified -The patient should continue with regular follow-up with the gastroenterologist and on his current PPI therapy as directed - POCT CBC - Hepatic function panel  3. ASCVD (arteriosclerotic cardiovascular disease) -He should continue to follow-up with cardiology as needed - POCT CBC - BMP8+EGFR - Hepatic function panel - NMR, lipoprofile  4. BPH (benign prostatic hypertrophy) -Continue follow-up with urology - POCT CBC  5. Hyperlipemia -Continue current aggressive therapeutic lifestyle changes with diet and exercise - POCT CBC - NMR, lipoprofile  6. Hypothyroidism, unspecified hypothyroidism type -Any change in therapy will be secondary to results of lab work today - POCT CBC - Thyroid Panel With TSH -Follow-up with neurosurgery  7. H/O: pituitary tumor -Follow up with neurosurgery - Prolactin - POCT CBC  Meds ordered this encounter  Medications  . bromocriptine (PARLODEL) 2.5 MG tablet    Sig: Take 1 tablet (2.5 mg total) by mouth 2 (two) times daily.    Dispense:  60 tablet    Refill:  6  . tamsulosin (FLOMAX) 0.4 MG CAPS capsule    Sig: Take 1 capsule (0.4 mg total) by mouth daily.    Dispense:  30 capsule    Refill:  6   Patient Instructions                       Medicare Annual Wellness Visit  [facility] and the medical providers at [facility] strive to bring you the best medical care.  In doing so we not only want to address your current medical conditions and concerns but also to detect new conditions early and prevent illness, disease and health-related problems.    Medicare offers a yearly Wellness Visit which allows our clinical staff to assess your need for preventative services including immunizations, lifestyle education, counseling to  decrease risk of preventable diseases and screening for fall risk and other medical concerns.    This visit is provided free of charge (no copay) for all Medicare recipients. The clinical pharmacists at [facility] have begun to conduct these Wellness Visits which will also include a thorough review of all your medications.    As you primary medical provider recommend that you make an appointment for your Annual Wellness Visit if you have not done so already this year.  You may set up this appointment before you leave today or you may call back ([phone]) and schedule an appointment.  Please make sure when you call that you mention that you are scheduling your Annual Wellness Visit with the clinical pharmacist so that the appointment may be made for the proper length of time.     Continue current medications. Continue good therapeutic lifestyle changes which include good diet and exercise. Fall precautions discussed with patient. If an FOBT was given today- please return it to our front desk. If you are over 33 years old - you may need Prevnar 66 or the adult Pneumonia vaccine.  Flu Shots are still available at our office. If you still haven't had one please call to set up a nurse visit to get one.   After your visit with [place] today you will receive a survey in the mail or online from [employer] regarding your care with [place]. Please take a moment to fill this out. Your feedback is very important to [place] as you can help [place] better understand your patient needs as well as improve your experience and satisfaction. WE CARE ABOUT YOU!!!   The patient should continue to take his medicines regularly and to get out of the house as much as possible with his wife so that he does not go stir crazy in the house all the time. He is encouraged to talk to me if he feels like he needs an abdominal depressant. He needs to drink plenty of fluids and avoid caffeine and avoid NSAIDs He should  follow-up with cardiology and gastroenterology as planned   Arrie Senate MD

## 2014-05-16 LAB — BMP8+EGFR
BUN/Creatinine Ratio: 13 (ref 10–22)
BUN: 13 mg/dL (ref 8–27)
CO2: 22 mmol/L (ref 18–29)
Calcium: 8.7 mg/dL (ref 8.6–10.2)
Chloride: 108 mmol/L (ref 97–108)
Creatinine, Ser: 1 mg/dL (ref 0.76–1.27)
GFR calc Af Amer: 82 mL/min/{1.73_m2} (ref >59)
GFR calc non Af Amer: 71 mL/min/{1.73_m2} (ref >59)
Glucose: 85 mg/dL (ref 65–99)
Potassium: 4.6 mmol/L (ref 3.5–5.2)
Sodium: 143 mmol/L (ref 134–144)

## 2014-05-16 LAB — NMR, LIPOPROFILE
Cholesterol: 164 mg/dL (ref 100–199)
HDL Cholesterol by NMR: 54 mg/dL (ref >39)
HDL Particle Number: 31.9 umol/L (ref >=30.5)
LDL Particle Number: 1232 nmol/L — ABNORMAL HIGH (ref <1000)
LDL Size: 21.3 nm (ref >20.5)
LDL-C: 99 mg/dL (ref 0–99)
LP-IR Score: <25 (ref <=45)
Small LDL Particle Number: 348 nmol/L (ref <=527)
Triglycerides by NMR: 57 mg/dL (ref 0–149)

## 2014-05-16 LAB — HEPATIC FUNCTION PANEL
ALT: 15 IU/L (ref 0–44)
AST: 21 IU/L (ref 0–40)
Albumin: 3.9 g/dL (ref 3.5–4.8)
Alkaline Phosphatase: 77 IU/L (ref 39–117)
Bilirubin Total: 0.5 mg/dL (ref 0.0–1.2)
Bilirubin, Direct: 0.13 mg/dL (ref 0.00–0.40)
Total Protein: 6.3 g/dL (ref 6.0–8.5)

## 2014-05-16 LAB — THYROID PANEL WITH TSH
Free Thyroxine Index: 2.1 (ref 1.2–4.9)
T3 Uptake Ratio: 32 % (ref 24–39)
T4, Total: 6.6 ug/dL (ref 4.5–12.0)
TSH: 3.49 u[IU]/mL (ref 0.450–4.500)

## 2014-05-16 LAB — PROLACTIN: Prolactin: 84.3 ng/mL — ABNORMAL HIGH (ref 4.0–15.2)

## 2014-05-16 LAB — VITAMIN D 25 HYDROXY (VIT D DEFICIENCY, FRACTURES): Vit D, 25-Hydroxy: 36 ng/mL (ref 30.0–100.0)

## 2014-05-19 ENCOUNTER — Other Ambulatory Visit: Payer: Medicare Other

## 2014-05-19 ENCOUNTER — Ambulatory Visit: Payer: Medicare Other | Attending: Orthopaedic Surgery | Admitting: Physical Therapy

## 2014-05-19 DIAGNOSIS — M436 Torticollis: Secondary | ICD-10-CM | POA: Insufficient documentation

## 2014-05-19 DIAGNOSIS — Z1212 Encounter for screening for malignant neoplasm of rectum: Secondary | ICD-10-CM

## 2014-05-19 DIAGNOSIS — M542 Cervicalgia: Secondary | ICD-10-CM | POA: Diagnosis not present

## 2014-05-19 NOTE — Therapy (Signed)
[name] [place], [place], [address] Phone: [phone]   Fax:  [phone]  Physical Therapy Evaluation  Patient Details  Name: [name] MRN: [id number] Date of Birth: [birth year] Referring Provider:  Mcarthur Rossetti*  Encounter Date: 05/19/2014      PT End of Session - 05/19/14 1107    Visit Number 1   Number of Visits 12   PT Start Time 2542   PT Stop Time 1124   PT Time Calculation (min) 42 min   Activity Tolerance Patient tolerated treatment well   Behavior During Therapy [facility] for tasks assessed/performed      Past Medical History  Diagnosis Date  . Emphysema   . CAD (coronary artery disease)     left main had  a 20% stenosis.  Left anterior descending had 40-50% proximal   stenosis.  Left circumflex had an 80% proximal stenosis.  The  first obtuse marginal artery had a 60% mid stenosis.  The  right  coronary artery  had  a 50-60% proximal stenosis with a 60-70%  mid stenosis  The patient was managed medically.   Marland Kitchen COPD (chronic obstructive pulmonary disease)   . Hypothyroidism   . Anemia   . Blood transfusion june 2011, 5 units given    july 2011 some units given  . Enlarged prostate     elevated psa recently  . GERD (gastroesophageal reflux disease)   . Arthritis   . Complication of anesthesia     likes spinal due to copd  . Dizziness     occasional  . Shortness of breath     with exertion  . Mass, scrotum     L scrotum - Sees Dr. Jeffie Pollock  . Abnormality of gait 06/07/2013  . Pituitary macroadenoma   . Restless legs syndrome (RLS)   . Blood transfusion without reported diagnosis   . Allergy   . Asthma   . Cataract     bilateral removal of cateracts  . Depression   . Emphysema of lung   . Oxygen deficiency     has o2 at home if needed    Past Surgical History  Procedure Laterality Date  . Coronary stent placement  5 yrs ago  . Hiatal hernia repair  01-04-2008  . Foot surgery   1994 left, 2002 right foot    bilateral foot reconstruciton  . Abdominal hernia repair   2008  . Cataract extraction both eyes    . Total hip arthroplasty  07/21/2011    Procedure: TOTAL HIP ARTHROPLASTY ANTERIOR APPROACH;  Surgeon: Mauri Pole, MD;  Location: [facility];  Service: Orthopedics;  Laterality: Left;  . Coronary angioplasty    . Total hip arthroplasty Right 09/07/2012    Procedure: RIGHT TOTAL HIP ARTHROPLASTY ANTERIOR APPROACH;  Surgeon: Mcarthur Rossetti, MD;  Location: [facility];  Service: Orthopedics;  Laterality: Right;  . Foot surgery      reconstruction of both feet  . Esophagogastroduodenoscopy N/A 04/20/2013    Procedure: ESOPHAGOGASTRODUODENOSCOPY (EGD);  Surgeon: Inda Castle, MD;  Location: [facility];  Service: Endoscopy;  Laterality: N/A;  . Colonoscopy    . Upper gastrointestinal endoscopy      There were no vitals filed for this visit.  Visit Diagnosis:  Neck pain - Plan: PT plan of care cert/re-cert      Subjective Assessment - 05/19/14 1207    Patient Stated Goals Get out of pain and use left UE better.  [facility] PT Assessment - 06/18/2014 0001    Assessment   Medical Diagnosis Left sided neck pain/left shoulder pain.   Onset Date --  9 months.   Precautions   Precautions None   Balance Screen   Has the patient fallen in the past 6 months Yes   How many times? --  2----uses a cane now.   Has the patient had a decrease in activity level because of a fear of falling?  Yes   Is the patient reluctant to leave their home because of a fear of falling?  Yes   ROM / Strength   AROM / PROM / Strength AROM;Strength   AROM   Overall AROM Comments Bilateral active cervical rotation= 40 degrees and bilateral active shoulder range of motion is equal.   Strength   Overall Strength Comments Left shoulder ER strength= 4-/5.   Palpation   Palpation --  Tender left ACJ.  Pain "in" neck.   Special Tests    Special Tests --  UE DTR's normal.                    [facility] Adult PT Treatment/Exercise - 2014/06/18 0001    Modalities   Modalities Traction   Traction   Type of Traction Cervical   Min (lbs) 5   Max (lbs) 14   Hold Time 99   Rest Time 5   Time 15                  PT Short Term Goals - 2014-06-18 1117    PT SHORT TERM GOAL #1   Title Ind with initial HEP.   Time 1   Period Weeks   Status New           PT Long Term Goals - 2014/06/18 1118    PT LONG TERM GOAL #1   Title Ind with advanced HEP.   Time 6   Period Weeks   Status New   PT LONG TERM GOAL #2   Title Bilateral active cervical rotation= 60 degrees   Time 6   Period Weeks   Status New   PT LONG TERM GOAL #3   Title Perform ADL's with pain not > 3/10   Time 6   Period Weeks   Status New   PT LONG TERM GOAL #4   Title Left shoulder strength= 5/5.   Time 6   Period Weeks   Status New               Plan - 06/18/14 06/27/04    PT Next Visit Plan Int cervical traction @ 15# x 10-12 minutes; left shoulder ER strengthening.  STW/M.          G-Codes - 06-18-2014 1209-06-27    Functional Assessment Tool Used FOTO   Functional Limitation Mobility: Walking and moving around   Mobility: Walking and Moving Around Current Status [phone]) At least 40 percent but less than 60 percent impaired, limited or restricted   Mobility: Walking and Moving Around Goal Status [phone]) At least 20 percent but less than 40 percent impaired, limited or restricted       Problem List Patient Active Problem List   Diagnosis Date Noted  . Neck pain, bilateral 12/31/2013  . Neuropathy 12/31/2013  . Vitamin B 12 deficiency 12/31/2013  . Abnormality of gait 06/07/2013  . Esophageal ulcer with bleeding 04/20/2013  . S/P left and ritght THA, AA 07/21/2011  . Special screening for malignant neoplasms,  colon 01/03/2011  . Personal history of colonic polyps 01/03/2011  . COPD GOLD I 08/24/2010  . GI BLEED 11/17/2009  . ESOPHAGEAL REFLUX 08/25/2009  .  BLOOD IN STOOL-MELENA 08/25/2009  . COLONIC POLYPS 07/17/2008  . ANEMIA 07/17/2008  . HYPOTENSION 07/17/2008  . ESOPHAGITIS 07/17/2008  . DIVERTICULOSIS, MILD 07/17/2008  . DEGENERATIVE JOINT DISEASE 07/17/2008  . OSTEOPOROSIS 07/17/2008  . BENIGN PROSTATIC HYPERTROPHY, HX OF 07/17/2008  . HYPERPROLACTINEMIA 10/24/2007  . PITUITARY ADENOMA 06/25/2007  . HYPOTHYROIDISM 06/25/2007  . HYPERLIPIDEMIA 06/22/2007  . CORONARY ARTERY DISEASE 06/22/2007    APPLEGATE, [place]  MPT 05/19/2014, 12:17 PM  [facility] [address] [place], [place], [address] Phone: [phone]   Fax:  [phone]

## 2014-05-22 ENCOUNTER — Ambulatory Visit: Payer: Medicare Other | Admitting: Physical Therapy

## 2014-05-22 DIAGNOSIS — M436 Torticollis: Secondary | ICD-10-CM | POA: Diagnosis not present

## 2014-05-22 DIAGNOSIS — M542 Cervicalgia: Secondary | ICD-10-CM | POA: Diagnosis not present

## 2014-05-22 LAB — FECAL OCCULT BLOOD, IMMUNOCHEMICAL: Fecal Occult Bld: NEGATIVE

## 2014-05-22 NOTE — Patient Instructions (Signed)
Instructed patient in chin tucks and cervical extension exercise.

## 2014-05-22 NOTE — Therapy (Signed)
[name] [place], [place], [address] Phone: [phone]   Fax:  [phone]  Physical Therapy Treatment  Patient Details  Name: [name] MRN: [id number] Date of Birth: 12/20/1932 Referring Provider:  Chipper Herb, MD  Encounter Date: 05/22/2014      PT End of Session - 05/22/14 1725    Visit Number 2   PT Start Time 0450   PT Stop Time 0526   PT Time Calculation (min) 36 min      Past Medical History  Diagnosis Date  . Emphysema   . CAD (coronary artery disease)     left main had  a 20% stenosis.  Left anterior descending had 40-50% proximal   stenosis.  Left circumflex had an 80% proximal stenosis.  The  first obtuse marginal artery had a 60% mid stenosis.  The  right  coronary artery  had  a 50-60% proximal stenosis with a 60-70%  mid stenosis  The patient was managed medically.   Marland Kitchen COPD (chronic obstructive pulmonary disease)   . Hypothyroidism   . Anemia   . Blood transfusion june 2011, 5 units given    july 2011 some units given  . Enlarged prostate     elevated psa recently  . GERD (gastroesophageal reflux disease)   . Arthritis   . Complication of anesthesia     likes spinal due to copd  . Dizziness     occasional  . Shortness of breath     with exertion  . Mass, scrotum     L scrotum - Sees Dr. Jeffie Pollock  . Abnormality of gait 06/07/2013  . Pituitary macroadenoma   . Restless legs syndrome (RLS)   . Blood transfusion without reported diagnosis   . Allergy   . Asthma   . Cataract     bilateral removal of cateracts  . Depression   . Emphysema of lung   . Oxygen deficiency     has o2 at home if needed    Past Surgical History  Procedure Laterality Date  . Coronary stent placement  5 yrs ago  . Hiatal hernia repair  01-04-2008  . Foot surgery  1994 left, 2002 right foot    bilateral foot reconstruciton  . Abdominal hernia repair   2008  . Cataract extraction both eyes    . Total hip  arthroplasty  07/21/2011    Procedure: TOTAL HIP ARTHROPLASTY ANTERIOR APPROACH;  Surgeon: Mauri Pole, MD;  Location: [facility];  Service: Orthopedics;  Laterality: Left;  . Coronary angioplasty    . Total hip arthroplasty Right 09/07/2012    Procedure: RIGHT TOTAL HIP ARTHROPLASTY ANTERIOR APPROACH;  Surgeon: Mcarthur Rossetti, MD;  Location: [facility];  Service: Orthopedics;  Laterality: Right;  . Foot surgery      reconstruction of both feet  . Esophagogastroduodenoscopy N/A 04/20/2013    Procedure: ESOPHAGOGASTRODUODENOSCOPY (EGD);  Surgeon: Inda Castle, MD;  Location: [facility];  Service: Endoscopy;  Laterality: N/A;  . Colonoscopy    . Upper gastrointestinal endoscopy      There were no vitals filed for this visit.  Visit Diagnosis:  Neck pain  Stiff neck      Subjective Assessment - 05/22/14 1729    Symptoms No new complaints.  Did traction at home yesterday.   Patient Stated Goals Get out of pain and use left UE better.  [facility] Adult PT Treatment/Exercise - 05/22/14 0001    Traction   Type of Traction Cervical   Min (lbs) 5   Max (lbs) 16   Hold Time 99   Rest Time 5   Time 11   Manual Therapy   Manual Therapy Other (comment)   Other Manual Therapy In supine gentle cervical STW and cervical ROM 13 minutes.                PT Education - 05/22/14 1731    Education provided Yes   Person(s) Educated Patient   Methods Explanation;Demonstration   Comprehension Verbalized understanding          PT Short Term Goals - 05/19/14 1117    PT SHORT TERM GOAL #1   Title Ind with initial HEP.   Time 1   Period Weeks   Status New           PT Long Term Goals - 05/19/14 1118    PT LONG TERM GOAL #1   Title Ind with advanced HEP.   Time 6   Period Weeks   Status New   PT LONG TERM GOAL #2   Title Bilateral active cervical rotation= 60 degrees   Time 6   Period Weeks   Status New   PT LONG TERM GOAL #3    Title Perform ADL's with pain not > 3/10   Time 6   Period Weeks   Status New   PT LONG TERM GOAL #4   Title Left shoulder strength= 5/5.   Time 6   Period Weeks   Status New               Plan - 05/22/14 1732    PT Next Visit Plan Int cervical traction @ 18# x 10-12 minutes; left shoulder ER strengthening.  STW/M.   Consulted and Agree with Plan of Care Patient        Problem List Patient Active Problem List   Diagnosis Date Noted  . Neck pain, bilateral 12/31/2013  . Neuropathy 12/31/2013  . Vitamin B 12 deficiency 12/31/2013  . Abnormality of gait 06/07/2013  . Esophageal ulcer with bleeding 04/20/2013  . S/P left and ritght THA, AA 07/21/2011  . Special screening for malignant neoplasms, colon 01/03/2011  . Personal history of colonic polyps 01/03/2011  . COPD GOLD I 08/24/2010  . GI BLEED 11/17/2009  . ESOPHAGEAL REFLUX 08/25/2009  . BLOOD IN STOOL-MELENA 08/25/2009  . COLONIC POLYPS 07/17/2008  . ANEMIA 07/17/2008  . HYPOTENSION 07/17/2008  . ESOPHAGITIS 07/17/2008  . DIVERTICULOSIS, MILD 07/17/2008  . DEGENERATIVE JOINT DISEASE 07/17/2008  . OSTEOPOROSIS 07/17/2008  . BENIGN PROSTATIC HYPERTROPHY, HX OF 07/17/2008  . HYPERPROLACTINEMIA 10/24/2007  . PITUITARY ADENOMA 06/25/2007  . HYPOTHYROIDISM 06/25/2007  . HYPERLIPIDEMIA 06/22/2007  . CORONARY ARTERY DISEASE 06/22/2007    Arcenia Scarbro, [place] MPT 05/22/2014, 5:34 PM  [facility] [address] [place], [place], [address] Phone: [phone]   Fax:  [phone]

## 2014-05-26 ENCOUNTER — Other Ambulatory Visit: Payer: Self-pay | Admitting: Endocrinology

## 2014-05-28 ENCOUNTER — Ambulatory Visit: Payer: Medicare Other | Admitting: Physical Therapy

## 2014-05-28 ENCOUNTER — Encounter: Payer: Self-pay | Admitting: Physical Therapy

## 2014-05-28 DIAGNOSIS — M542 Cervicalgia: Secondary | ICD-10-CM

## 2014-05-28 DIAGNOSIS — M436 Torticollis: Secondary | ICD-10-CM

## 2014-05-28 NOTE — Therapy (Signed)
[name] [place], [place], [address] Phone: [phone]   Fax:  [phone]  Physical Therapy Treatment  Patient Details  Name: [name] MRN: [id number] Date of Birth: 04/02/1932 Referring Provider:  Chipper Herb, MD  Encounter Date: 05/28/2014      PT End of Session - 05/28/14 1139    Visit Number 3   Number of Visits 12   PT Start Time 2876   PT Stop Time 1155   PT Time Calculation (min) 37 min   Activity Tolerance Patient tolerated treatment well   Behavior During Therapy [facility] for tasks assessed/performed      Past Medical History  Diagnosis Date  . Emphysema   . CAD (coronary artery disease)     left main had  a 20% stenosis.  Left anterior descending had 40-50% proximal   stenosis.  Left circumflex had an 80% proximal stenosis.  The  first obtuse marginal artery had a 60% mid stenosis.  The  right  coronary artery  had  a 50-60% proximal stenosis with a 60-70%  mid stenosis  The patient was managed medically.   Marland Kitchen COPD (chronic obstructive pulmonary disease)   . Hypothyroidism   . Anemia   . Blood transfusion june 2011, 5 units given    july 2011 some units given  . Enlarged prostate     elevated psa recently  . GERD (gastroesophageal reflux disease)   . Arthritis   . Complication of anesthesia     likes spinal due to copd  . Dizziness     occasional  . Shortness of breath     with exertion  . Mass, scrotum     L scrotum - Sees Dr. Jeffie Pollock  . Abnormality of gait 06/07/2013  . Pituitary macroadenoma   . Restless legs syndrome (RLS)   . Blood transfusion without reported diagnosis   . Allergy   . Asthma   . Cataract     bilateral removal of cateracts  . Depression   . Emphysema of lung   . Oxygen deficiency     has o2 at home if needed    Past Surgical History  Procedure Laterality Date  . Coronary stent placement  5 yrs ago  . Hiatal hernia repair  01-04-2008  . Foot surgery  1994  left, 2002 right foot    bilateral foot reconstruciton  . Abdominal hernia repair   2008  . Cataract extraction both eyes    . Total hip arthroplasty  07/21/2011    Procedure: TOTAL HIP ARTHROPLASTY ANTERIOR APPROACH;  Surgeon: Mauri Pole, MD;  Location: [facility];  Service: Orthopedics;  Laterality: Left;  . Coronary angioplasty    . Total hip arthroplasty Right 09/07/2012    Procedure: RIGHT TOTAL HIP ARTHROPLASTY ANTERIOR APPROACH;  Surgeon: Mcarthur Rossetti, MD;  Location: [facility];  Service: Orthopedics;  Laterality: Right;  . Foot surgery      reconstruction of both feet  . Esophagogastroduodenoscopy N/A 04/20/2013    Procedure: ESOPHAGOGASTRODUODENOSCOPY (EGD);  Surgeon: Inda Castle, MD;  Location: [facility];  Service: Endoscopy;  Laterality: N/A;  . Colonoscopy    . Upper gastrointestinal endoscopy      There were no vitals filed for this visit.  Visit Diagnosis:  Neck pain  Stiff neck      Subjective Assessment - 05/28/14 1120    Symptoms did good after last tx   Currently in Pain? Yes   Pain Score  3    Pain Location Neck   Pain Orientation Left   Aggravating Factors  activity   Pain Relieving Factors rest                       [facility] Adult PT Treatment/Exercise - 05/28/14 0001    Traction   Type of Traction Cervical   Min (lbs) 5   Max (lbs) 18   Hold Time 99   Rest Time 5   Time 10   Manual Therapy   Manual Therapy Myofascial release   Myofascial Release gentle STW to c-cpine paraspinals                PT Education - 05/28/14 1125    Education provided Yes   Education Details HEP posture/ROM   Person(s) Educated Patient   Methods Explanation;Demonstration;Handout   Comprehension Verbalized understanding;Returned demonstration          PT Short Term Goals - 05/28/14 1143    PT SHORT TERM GOAL #1   Title Ind with initial HEP.   Time 1   Period Weeks   Status Achieved           PT Long Term Goals - 05/19/14  1118    PT LONG TERM GOAL #1   Title Ind with advanced HEP.   Time 6   Period Weeks   Status New   PT LONG TERM GOAL #2   Title Bilateral active cervical rotation= 60 degrees   Time 6   Period Weeks   Status New   PT LONG TERM GOAL #3   Title Perform ADL's with pain not > 3/10   Time 6   Period Weeks   Status New   PT LONG TERM GOAL #4   Title Left shoulder strength= 5/5.   Time 6   Period Weeks   Status New               Plan - 05/28/14 1140    Clinical Impression Statement pt tolerated tx well with no pain increase. gave pt HEP for posture and ROM today. pt has forward head posture and is aware of ex's to help improve symptoms.met STG #1 other golas ongoing.   PT Next Visit Plan Int cervical traction per MPT and left shoulder ER strengthening/STW   Consulted and Agree with Plan of Care Patient        Problem List Patient Active Problem List   Diagnosis Date Noted  . Neck pain, bilateral 12/31/2013  . Neuropathy 12/31/2013  . Vitamin B 12 deficiency 12/31/2013  . Abnormality of gait 06/07/2013  . Esophageal ulcer with bleeding 04/20/2013  . S/P left and ritght THA, AA 07/21/2011  . Special screening for malignant neoplasms, colon 01/03/2011  . Personal history of colonic polyps 01/03/2011  . COPD GOLD I 08/24/2010  . GI BLEED 11/17/2009  . ESOPHAGEAL REFLUX 08/25/2009  . BLOOD IN STOOL-MELENA 08/25/2009  . COLONIC POLYPS 07/17/2008  . ANEMIA 07/17/2008  . HYPOTENSION 07/17/2008  . ESOPHAGITIS 07/17/2008  . DIVERTICULOSIS, MILD 07/17/2008  . DEGENERATIVE JOINT DISEASE 07/17/2008  . OSTEOPOROSIS 07/17/2008  . BENIGN PROSTATIC HYPERTROPHY, HX OF 07/17/2008  . HYPERPROLACTINEMIA 10/24/2007  . PITUITARY ADENOMA 06/25/2007  . HYPOTHYROIDISM 06/25/2007  . HYPERLIPIDEMIA 06/22/2007  . CORONARY ARTERY DISEASE 06/22/2007    Phillips Climes, PTA 05/28/2014, 11:55 AM  [facility] [address] [place], [place], [address] Phone: [phone]   Fax:  [phone]

## 2014-05-28 NOTE — Patient Instructions (Signed)
AROM: Neck Rotation   Turn head slowly to look over one shoulder, then the other. Hold each position _10___ seconds. Repeat _5___ times per set. Do __2__ sets per session. Do _2-3___ sessions per day.  http://orth.exer.us/294   Copyright  VHI. All rights reserved.  AROM: Lateral Neck Flexion   Slowly tilt head toward one shoulder, then the other. Hold each position _10___ seconds. Repeat __5__ times per set. Do __2__ sets per session. Do __2-3__ sessions per day.  http://orth.exer.us/296   Copyright  VHI. All rights reserved.  Stretch Break - Chin Tuck   Looking straight forward, tuck chin and hold __10__ seconds. Relax and return to starting position. Repeat __5-10__ times every _3-4___ hours.  Copyright  VHI. All rights reserved.  Stretch Break - Chest and Shoulder Stretch   Maintaining erect posture, draw shoulders back while bringing elbows back and inward. Return to starting position. Repeat __10-20__ times every _3-4___ hours.  Copyright  VHI. All rights reserved.    

## 2014-05-30 ENCOUNTER — Encounter: Payer: Self-pay | Admitting: Physical Therapy

## 2014-05-30 ENCOUNTER — Ambulatory Visit: Payer: Medicare Other | Attending: Orthopaedic Surgery | Admitting: Physical Therapy

## 2014-05-30 DIAGNOSIS — M542 Cervicalgia: Secondary | ICD-10-CM | POA: Diagnosis not present

## 2014-05-30 DIAGNOSIS — M436 Torticollis: Secondary | ICD-10-CM | POA: Insufficient documentation

## 2014-05-30 NOTE — Therapy (Signed)
[name] [place], [place], [address] Phone: [phone]   Fax:  [phone]  Physical Therapy Treatment  Patient Details  Name: [name] MRN: [id number] Date of Birth: [birth year] Referring Provider:  Chipper Herb, MD  Encounter Date: 05/30/2014      PT End of Session - 05/30/14 1144    Visit Number 4   Number of Visits 12   PT Start Time 5465   PT Stop Time 1156   PT Time Calculation (min) 42 min   Activity Tolerance Patient tolerated treatment well   Behavior During Therapy [facility] for tasks assessed/performed      Past Medical History  Diagnosis Date  . Emphysema   . CAD (coronary artery disease)     left main had  a 20% stenosis.  Left anterior descending had 40-50% proximal   stenosis.  Left circumflex had an 80% proximal stenosis.  The  first obtuse marginal artery had a 60% mid stenosis.  The  right  coronary artery  had  a 50-60% proximal stenosis with a 60-70%  mid stenosis  The patient was managed medically.   Marland Kitchen COPD (chronic obstructive pulmonary disease)   . Hypothyroidism   . Anemia   . Blood transfusion june 2011, 5 units given    july 2011 some units given  . Enlarged prostate     elevated psa recently  . GERD (gastroesophageal reflux disease)   . Arthritis   . Complication of anesthesia     likes spinal due to copd  . Dizziness     occasional  . Shortness of breath     with exertion  . Mass, scrotum     L scrotum - Sees Dr. Jeffie Pollock  . Abnormality of gait 06/07/2013  . Pituitary macroadenoma   . Restless legs syndrome (RLS)   . Blood transfusion without reported diagnosis   . Allergy   . Asthma   . Cataract     bilateral removal of cateracts  . Depression   . Emphysema of lung   . Oxygen deficiency     has o2 at home if needed    Past Surgical History  Procedure Laterality Date  . Coronary stent placement  5 yrs ago  . Hiatal hernia repair  01-04-2008  . Foot surgery  1994  left, 2002 right foot    bilateral foot reconstruciton  . Abdominal hernia repair   2008  . Cataract extraction both eyes    . Total hip arthroplasty  07/21/2011    Procedure: TOTAL HIP ARTHROPLASTY ANTERIOR APPROACH;  Surgeon: Mauri Pole, MD;  Location: [facility];  Service: Orthopedics;  Laterality: Left;  . Coronary angioplasty    . Total hip arthroplasty Right 09/07/2012    Procedure: RIGHT TOTAL HIP ARTHROPLASTY ANTERIOR APPROACH;  Surgeon: Mcarthur Rossetti, MD;  Location: [facility];  Service: Orthopedics;  Laterality: Right;  . Foot surgery      reconstruction of both feet  . Esophagogastroduodenoscopy N/A 04/20/2013    Procedure: ESOPHAGOGASTRODUODENOSCOPY (EGD);  Surgeon: Inda Castle, MD;  Location: [facility];  Service: Endoscopy;  Laterality: N/A;  . Colonoscopy    . Upper gastrointestinal endoscopy      There were no vitals filed for this visit.  Visit Diagnosis:  Neck pain  Stiff neck      Subjective Assessment - 05/30/14 1123    Symptoms did good after last tx   Patient Stated Goals Get out of pain and  use left UE better.   Currently in Pain? Yes   Pain Score 2    Pain Location Neck   Pain Orientation Left   Pain Descriptors / Indicators Constant   Pain Type Chronic pain   Pain Onset More than a month ago   Aggravating Factors  activity   Pain Relieving Factors rest                       [facility] Adult PT Treatment/Exercise - 05/30/14 0001    Exercises   Exercises Shoulder   Shoulder Exercises: Supine   Theraband Level (Shoulder External Rotation) Level 1 (Yellow)  3x10   External Rotation Weight (lbs) 1# 3 x 10 with UE support   Traction   Type of Traction Cervical   Min (lbs) 5   Max (lbs) 18   Hold Time 99   Rest Time 5   Time 10   Manual Therapy   Manual Therapy Myofascial release   Myofascial Release gentle STW to c-cpine paraspinals                  PT Short Term Goals - 05/28/14 1143    PT SHORT TERM GOAL #1    Title Ind with initial HEP.   Time 1   Period Weeks   Status Achieved           PT Long Term Goals - 05/19/14 1118    PT LONG TERM GOAL #1   Title Ind with advanced HEP.   Time 6   Period Weeks   Status New   PT LONG TERM GOAL #2   Title Bilateral active cervical rotation= 60 degrees   Time 6   Period Weeks   Status New   PT LONG TERM GOAL #3   Title Perform ADL's with pain not > 3/10   Time 6   Period Weeks   Status New   PT LONG TERM GOAL #4   Title Left shoulder strength= 5/5.   Time 6   Period Weeks   Status New               Plan - 05/30/14 1146    Clinical Impression Statement pt tolerated tx well today with traction and left shoulder ER strength. goals ongoing.   PT Treatment/Interventions Electrical Stimulation;Traction;Manual techniques;Therapeutic exercise   PT Next Visit Plan Int cervical traction per MPT and left shoulder ER strengthening/STW   Consulted and Agree with Plan of Care Patient        Problem List Patient Active Problem List   Diagnosis Date Noted  . Neck pain, bilateral 12/31/2013  . Neuropathy 12/31/2013  . Vitamin B 12 deficiency 12/31/2013  . Abnormality of gait 06/07/2013  . Esophageal ulcer with bleeding 04/20/2013  . S/P left and ritght THA, AA 07/21/2011  . Special screening for malignant neoplasms, colon 01/03/2011  . Personal history of colonic polyps 01/03/2011  . COPD GOLD I 08/24/2010  . GI BLEED 11/17/2009  . ESOPHAGEAL REFLUX 08/25/2009  . BLOOD IN STOOL-MELENA 08/25/2009  . COLONIC POLYPS 07/17/2008  . ANEMIA 07/17/2008  . HYPOTENSION 07/17/2008  . ESOPHAGITIS 07/17/2008  . DIVERTICULOSIS, MILD 07/17/2008  . DEGENERATIVE JOINT DISEASE 07/17/2008  . OSTEOPOROSIS 07/17/2008  . BENIGN PROSTATIC HYPERTROPHY, HX OF 07/17/2008  . HYPERPROLACTINEMIA 10/24/2007  . PITUITARY ADENOMA 06/25/2007  . HYPOTHYROIDISM 06/25/2007  . HYPERLIPIDEMIA 06/22/2007  . CORONARY ARTERY DISEASE 06/22/2007    [name],  [name], PTA 05/30/2014, 11:56 AM  [name] [place], [place], [address] Phone: [phone]   Fax:  [phone]

## 2014-06-01 ENCOUNTER — Encounter: Payer: Self-pay | Admitting: Endocrinology

## 2014-06-03 ENCOUNTER — Ambulatory Visit: Payer: Medicare Other | Admitting: *Deleted

## 2014-06-03 ENCOUNTER — Encounter: Payer: Self-pay | Admitting: *Deleted

## 2014-06-03 DIAGNOSIS — M542 Cervicalgia: Secondary | ICD-10-CM | POA: Diagnosis not present

## 2014-06-03 DIAGNOSIS — M436 Torticollis: Secondary | ICD-10-CM | POA: Diagnosis not present

## 2014-06-03 NOTE — Therapy (Signed)
[name] [place], [place], [address] Phone: [phone]   Fax:  [phone]  Physical Therapy Treatment  Patient Details  Name: [name] MRN: [id number] Date of Birth: Feb 09, 1933 Referring Provider:  Chipper Herb, MD  Encounter Date: 06/03/2014      PT End of Session - 06/03/14 0853    Visit Number 5   Number of Visits 12   PT Start Time 0815   PT Stop Time 0905   PT Time Calculation (min) 50 min      Past Medical History  Diagnosis Date  . Emphysema   . CAD (coronary artery disease)     left main had  a 20% stenosis.  Left anterior descending had 40-50% proximal   stenosis.  Left circumflex had an 80% proximal stenosis.  The  first obtuse marginal artery had a 60% mid stenosis.  The  right  coronary artery  had  a 50-60% proximal stenosis with a 60-70%  mid stenosis  The patient was managed medically.   Marland Kitchen COPD (chronic obstructive pulmonary disease)   . Hypothyroidism   . Anemia   . Blood transfusion june 2011, 5 units given    july 2011 some units given  . Enlarged prostate     elevated psa recently  . GERD (gastroesophageal reflux disease)   . Arthritis   . Complication of anesthesia     likes spinal due to copd  . Dizziness     occasional  . Shortness of breath     with exertion  . Mass, scrotum     L scrotum - Sees Dr. Jeffie Pollock  . Abnormality of gait 06/07/2013  . Pituitary macroadenoma   . Restless legs syndrome (RLS)   . Blood transfusion without reported diagnosis   . Allergy   . Asthma   . Cataract     bilateral removal of cateracts  . Depression   . Emphysema of lung   . Oxygen deficiency     has o2 at home if needed    Past Surgical History  Procedure Laterality Date  . Coronary stent placement  5 yrs ago  . Hiatal hernia repair  01-04-2008  . Foot surgery  1994 left, 2002 right foot    bilateral foot reconstruciton  . Abdominal hernia repair   2008  . Cataract extraction both  eyes    . Total hip arthroplasty  07/21/2011    Procedure: TOTAL HIP ARTHROPLASTY ANTERIOR APPROACH;  Surgeon: Mauri Pole, MD;  Location: [facility];  Service: Orthopedics;  Laterality: Left;  . Coronary angioplasty    . Total hip arthroplasty Right 09/07/2012    Procedure: RIGHT TOTAL HIP ARTHROPLASTY ANTERIOR APPROACH;  Surgeon: Mcarthur Rossetti, MD;  Location: [facility];  Service: Orthopedics;  Laterality: Right;  . Foot surgery      reconstruction of both feet  . Esophagogastroduodenoscopy N/A 04/20/2013    Procedure: ESOPHAGOGASTRODUODENOSCOPY (EGD);  Surgeon: Inda Castle, MD;  Location: [facility];  Service: Endoscopy;  Laterality: N/A;  . Colonoscopy    . Upper gastrointestinal endoscopy      There were no vitals filed for this visit.  Visit Diagnosis:  Neck pain  Stiff neck      Subjective Assessment - 06/03/14 0818    Subjective LT shldr very sore after last Rx   Patient Stated Goals Get out of pain and use left UE better.   Currently in Pain? Yes   Pain Score 3  Pain Location Neck   Pain Orientation Left   Pain Descriptors / Indicators Constant   Pain Type Chronic pain   Pain Onset More than a month ago   Pain Frequency Constant   Aggravating Factors  activity   Pain Relieving Factors rest in sitting   Pain Score 3   Pain Location Shoulder   Pain Orientation Left   Pain Descriptors / Indicators Aching;Constant   Pain Type Chronic pain   Pain Frequency Constant                       [facility] Adult PT Treatment/Exercise - 06/03/14 0001    Traction   Type of Traction Cervical   Min (lbs) 5   Max (lbs) 18   Hold Time 99   Rest Time 5   Time 10   Manual Therapy   Manual Therapy Myofascial release   Myofascial Release IASTM to RT shldr,shldr blade area,Utrap, Levator and into cerv paras in sitting   Other Manual Therapy TPR to LT Utrap and Levator Scap with pt sitting                  PT Short Term Goals - 05/28/14 1143     PT SHORT TERM GOAL #1   Title Ind with initial HEP.   Time 1   Period Weeks   Status Achieved           PT Long Term Goals - 05/19/14 1118    PT LONG TERM GOAL #1   Title Ind with advanced HEP.   Time 6   Period Weeks   Status New   PT LONG TERM GOAL #2   Title Bilateral active cervical rotation= 60 degrees   Time 6   Period Weeks   Status New   PT LONG TERM GOAL #3   Title Perform ADL's with pain not > 3/10   Time 6   Period Weeks   Status New   PT LONG TERM GOAL #4   Title Left shoulder strength= 5/5.   Time 6   Period Weeks   Status New               Plan - 06/03/14 0102    Clinical Impression Statement Pt did good with todays Rx, but continues to have episodes of sharp pain in LT shldr with ADLs. NO shldr exs today due to pain.  FOTO done today   PT Treatment/Interventions Electrical Stimulation;Traction;Manual techniques;Therapeutic exercise   PT Next Visit Plan Int cervical traction per MPT and left shoulder ER strengthening/STW.  FOTO 10 visit        Problem List Patient Active Problem List   Diagnosis Date Noted  . Neck pain, bilateral 12/31/2013  . Neuropathy 12/31/2013  . Vitamin B 12 deficiency 12/31/2013  . Abnormality of gait 06/07/2013  . Esophageal ulcer with bleeding 04/20/2013  . S/P left and ritght THA, AA 07/21/2011  . Special screening for malignant neoplasms, colon 01/03/2011  . Personal history of colonic polyps 01/03/2011  . COPD GOLD I 08/24/2010  . GI BLEED 11/17/2009  . ESOPHAGEAL REFLUX 08/25/2009  . BLOOD IN STOOL-MELENA 08/25/2009  . COLONIC POLYPS 07/17/2008  . ANEMIA 07/17/2008  . HYPOTENSION 07/17/2008  . ESOPHAGITIS 07/17/2008  . DIVERTICULOSIS, MILD 07/17/2008  . DEGENERATIVE JOINT DISEASE 07/17/2008  . OSTEOPOROSIS 07/17/2008  . BENIGN PROSTATIC HYPERTROPHY, HX OF 07/17/2008  . HYPERPROLACTINEMIA 10/24/2007  . PITUITARY ADENOMA 06/25/2007  . HYPOTHYROIDISM 06/25/2007  . HYPERLIPIDEMIA  06/22/2007  .  CORONARY ARTERY DISEASE 06/22/2007    Azaiah Licciardi,CHRIS,PTA 06/03/2014, 9:08 AM  [name] [address] [place], [place], [address] Phone: [phone]   Fax:  [phone]

## 2014-06-06 ENCOUNTER — Encounter: Payer: Self-pay | Admitting: *Deleted

## 2014-06-06 ENCOUNTER — Ambulatory Visit: Payer: Medicare Other | Admitting: *Deleted

## 2014-06-06 ENCOUNTER — Other Ambulatory Visit: Payer: Self-pay | Admitting: *Deleted

## 2014-06-06 DIAGNOSIS — M436 Torticollis: Secondary | ICD-10-CM

## 2014-06-06 DIAGNOSIS — M542 Cervicalgia: Secondary | ICD-10-CM | POA: Diagnosis not present

## 2014-06-06 MED ORDER — BUDESONIDE-FORMOTEROL FUMARATE 160-4.5 MCG/ACT IN AERO: 2.0000 | INHALATION_SPRAY | Freq: Two times a day (BID) | RESPIRATORY_TRACT | Status: DC

## 2014-06-06 NOTE — Therapy (Signed)
[name] [place], [place], [address] Phone: [phone]   Fax:  [phone]  Physical Therapy Treatment  Patient Details  Name: [name] MRN: [id number] Date of Birth: [birth year] Referring Provider:  Chipper Herb, MD  Encounter Date: 06/06/2014      PT End of Session - 06/06/14 1233    Visit Number 6   Number of Visits 12   PT Start Time 5701   PT Stop Time 7793   PT Time Calculation (min) 49 min      Past Medical History  Diagnosis Date  . Emphysema   . CAD (coronary artery disease)     left main had  a 20% stenosis.  Left anterior descending had 40-50% proximal   stenosis.  Left circumflex had an 80% proximal stenosis.  The  first obtuse marginal artery had a 60% mid stenosis.  The  right  coronary artery  had  a 50-60% proximal stenosis with a 60-70%  mid stenosis  The patient was managed medically.   Marland Kitchen COPD (chronic obstructive pulmonary disease)   . Hypothyroidism   . Anemia   . Blood transfusion june 2011, 5 units given    july 2011 some units given  . Enlarged prostate     elevated psa recently  . GERD (gastroesophageal reflux disease)   . Arthritis   . Complication of anesthesia     likes spinal due to copd  . Dizziness     occasional  . Shortness of breath     with exertion  . Mass, scrotum     L scrotum - Sees Dr. Jeffie Pollock  . Abnormality of gait 06/07/2013  . Pituitary macroadenoma   . Restless legs syndrome (RLS)   . Blood transfusion without reported diagnosis   . Allergy   . Asthma   . Cataract     bilateral removal of cateracts  . Depression   . Emphysema of lung   . Oxygen deficiency     has o2 at home if needed    Past Surgical History  Procedure Laterality Date  . Coronary stent placement  5 yrs ago  . Hiatal hernia repair  01-04-2008  . Foot surgery  1994 left, 2002 right foot    bilateral foot reconstruciton  . Abdominal hernia repair   2008  . Cataract extraction both  eyes    . Total hip arthroplasty  07/21/2011    Procedure: TOTAL HIP ARTHROPLASTY ANTERIOR APPROACH;  Surgeon: Mauri Pole, MD;  Location: [facility];  Service: Orthopedics;  Laterality: Left;  . Coronary angioplasty    . Total hip arthroplasty Right 09/07/2012    Procedure: RIGHT TOTAL HIP ARTHROPLASTY ANTERIOR APPROACH;  Surgeon: Mcarthur Rossetti, MD;  Location: [facility];  Service: Orthopedics;  Laterality: Right;  . Foot surgery      reconstruction of both feet  . Esophagogastroduodenoscopy N/A 04/20/2013    Procedure: ESOPHAGOGASTRODUODENOSCOPY (EGD);  Surgeon: Inda Castle, MD;  Location: [facility];  Service: Endoscopy;  Laterality: N/A;  . Colonoscopy    . Upper gastrointestinal endoscopy      There were no vitals filed for this visit.  Visit Diagnosis:  Neck pain  Stiff neck      Subjective Assessment - 06/06/14 1130    Subjective LT shldr and neck are sore   Patient Stated Goals Get out of pain and use left UE better.   Currently in Pain? Yes   Pain Score 3  Pain Orientation Left   Pain Descriptors / Indicators Constant   Pain Type Chronic pain   Pain Onset More than a month ago   Pain Frequency Constant   Aggravating Factors  activity   Pain Relieving Factors rest in sitting            [facility] PT Assessment - 06/06/14 0001    AROM   Overall AROM Comments active cervical rotation= 40 degrees to LT and 45 degrees to RT and bilateral active shoulder range of motion is equal.                   [facility] Adult PT Treatment/Exercise - 06/06/14 0001    Posture/Postural Control   Posture Comments Reviewed standing and sitting postures   Exercises   Exercises Neck   Neck Exercises: Standing   Neck Retraction 10 reps;3 secs   Neck Exercises: Supine   Neck Retraction 10 reps;3 secs   Shoulder Exercises: Supine   Other Supine Exercises scapular retraction and Pec stretches for postue correction   Shoulder Exercises: Standing   Other Standing  Exercises Posture stretches for Ant. stretch and scapular depression and retraction   Other Standing Exercises ER isometrics LT shldr   Traction   Type of Traction Cervical   Min (lbs) 5   Max (lbs) 18   Hold Time 99   Rest Time 5   Time 15   Manual Therapy   Manual Therapy Myofascial release   Myofascial Release to Pec major and minor f/b passive stretching to both sides supine   Neck Exercises: Stretches   Chest Stretch 5 reps   Chest Stretch Limitations LT shldr pain                  PT Short Term Goals - 05/28/14 1143    PT SHORT TERM GOAL #1   Title Ind with initial HEP.   Time 1   Period Weeks   Status Achieved           PT Long Term Goals - 05/19/14 1118    PT LONG TERM GOAL #1   Title Ind with advanced HEP.   Time 6   Period Weeks   Status New   PT LONG TERM GOAL #2   Title Bilateral active cervical rotation= 60 degrees   Time 6   Period Weeks   Status New   PT LONG TERM GOAL #3   Title Perform ADL's with pain not > 3/10   Time 6   Period Weeks   Status New   PT LONG TERM GOAL #4   Title Left shoulder strength= 5/5.   Time 6   Period Weeks   Status New               Plan - 06/06/14 1233    Clinical Impression Statement Pt did fair today. Better understanding of postue and it's importance after today's session. Goals are ongoing at this time   PT Treatment/Interventions Electrical Stimulation;Traction;Manual techniques;Therapeutic exercise   PT Next Visit Plan Int cervical traction per MPT and left shoulder ER strengthening/STW.  posture stretches FOTO 10 visit        Problem List Patient Active Problem List   Diagnosis Date Noted  . Neck pain, bilateral 12/31/2013  . Neuropathy 12/31/2013  . Vitamin B 12 deficiency 12/31/2013  . Abnormality of gait 06/07/2013  . Esophageal ulcer with bleeding 04/20/2013  . S/P left and ritght THA, AA 07/21/2011  . Special screening  for malignant neoplasms, colon 01/03/2011  . Personal  history of colonic polyps 01/03/2011  . COPD GOLD I 08/24/2010  . GI BLEED 11/17/2009  . ESOPHAGEAL REFLUX 08/25/2009  . BLOOD IN STOOL-MELENA 08/25/2009  . COLONIC POLYPS 07/17/2008  . ANEMIA 07/17/2008  . HYPOTENSION 07/17/2008  . ESOPHAGITIS 07/17/2008  . DIVERTICULOSIS, MILD 07/17/2008  . DEGENERATIVE JOINT DISEASE 07/17/2008  . OSTEOPOROSIS 07/17/2008  . BENIGN PROSTATIC HYPERTROPHY, HX OF 07/17/2008  . HYPERPROLACTINEMIA 10/24/2007  . PITUITARY ADENOMA 06/25/2007  . HYPOTHYROIDISM 06/25/2007  . HYPERLIPIDEMIA 06/22/2007  . CORONARY ARTERY DISEASE 06/22/2007    RAMSEUR,CHRIS,PTA 06/06/2014, 12:40 PM  [name] [place], [place], [address] Phone: [phone]   Fax:  [phone]

## 2014-06-09 ENCOUNTER — Encounter: Payer: Self-pay | Admitting: Family Medicine

## 2014-06-12 ENCOUNTER — Ambulatory Visit: Payer: Medicare Other | Admitting: Physical Therapy

## 2014-06-12 DIAGNOSIS — M436 Torticollis: Secondary | ICD-10-CM | POA: Diagnosis not present

## 2014-06-12 DIAGNOSIS — M542 Cervicalgia: Secondary | ICD-10-CM

## 2014-06-12 NOTE — Therapy (Signed)
[name] [place], [place], [address] Phone: [phone]   Fax:  [phone]  Physical Therapy Treatment  Patient Details  Name: [name] MRN: [id number] Date of Birth: May 14, 1932 Referring Provider:  Chipper Herb, MD  Encounter Date: 06/12/2014      PT End of Session - 06/12/14 1142    Visit Number 7   Number of Visits 12   Date for PT Re-Evaluation 07/10/14   PT Start Time 1117   PT Stop Time 1151   PT Time Calculation (min) 34 min   Activity Tolerance Patient tolerated treatment well      Past Medical History  Diagnosis Date  . Emphysema   . CAD (coronary artery disease)     left main had  a 20% stenosis.  Left anterior descending had 40-50% proximal   stenosis.  Left circumflex had an 80% proximal stenosis.  The  first obtuse marginal artery had a 60% mid stenosis.  The  right  coronary artery  had  a 50-60% proximal stenosis with a 60-70%  mid stenosis  The patient was managed medically.   Marland Kitchen COPD (chronic obstructive pulmonary disease)   . Hypothyroidism   . Anemia   . Blood transfusion june 2011, 5 units given    july 2011 some units given  . Enlarged prostate     elevated psa recently  . GERD (gastroesophageal reflux disease)   . Arthritis   . Complication of anesthesia     likes spinal due to copd  . Dizziness     occasional  . Shortness of breath     with exertion  . Mass, scrotum     L scrotum - Sees Dr. Jeffie Pollock  . Abnormality of gait 06/07/2013  . Pituitary macroadenoma   . Restless legs syndrome (RLS)   . Blood transfusion without reported diagnosis   . Allergy   . Asthma   . Cataract     bilateral removal of cateracts  . Depression   . Emphysema of lung   . Oxygen deficiency     has o2 at home if needed    Past Surgical History  Procedure Laterality Date  . Coronary stent placement  5 yrs ago  . Hiatal hernia repair  01-04-2008  . Foot surgery  1994 left, 2002 right foot   bilateral foot reconstruciton  . Abdominal hernia repair   2008  . Cataract extraction both eyes    . Total hip arthroplasty  07/21/2011    Procedure: TOTAL HIP ARTHROPLASTY ANTERIOR APPROACH;  Surgeon: Mauri Pole, MD;  Location: [facility];  Service: Orthopedics;  Laterality: Left;  . Coronary angioplasty    . Total hip arthroplasty Right 09/07/2012    Procedure: RIGHT TOTAL HIP ARTHROPLASTY ANTERIOR APPROACH;  Surgeon: Mcarthur Rossetti, MD;  Location: [facility];  Service: Orthopedics;  Laterality: Right;  . Foot surgery      reconstruction of both feet  . Esophagogastroduodenoscopy N/A 04/20/2013    Procedure: ESOPHAGOGASTRODUODENOSCOPY (EGD);  Surgeon: Inda Castle, MD;  Location: [facility];  Service: Endoscopy;  Laterality: N/A;  . Colonoscopy    . Upper gastrointestinal endoscopy      There were no vitals filed for this visit.  Visit Diagnosis:  Neck pain  Stiff neck      Subjective Assessment - 06/12/14 1140    Subjective Neck is defintely doing better.   Pain Score 3    Pain Location Neck   Pain  Orientation Left   Pain Descriptors / Indicators Constant   Pain Type Chronic pain   Pain Onset More than a month ago   Pain Frequency Intermittent                       [facility] Adult PT Treatment/Exercise - 06/12/14 1141    Traction   Type of Traction Cervical   Min (lbs) 5   Max (lbs) 19   Hold Time 99   Rest Time 5   Time 12   Manual Therapy   Manual Therapy --  MFR and cervical ROM x 8 minutes.                  PT Short Term Goals - 05/28/14 1143    PT SHORT TERM GOAL #1   Title Ind with initial HEP.   Time 1   Period Weeks   Status Achieved           PT Long Term Goals - 05/19/14 1118    PT LONG TERM GOAL #1   Title Ind with advanced HEP.   Time 6   Period Weeks   Status New   PT LONG TERM GOAL #2   Title Bilateral active cervical rotation= 60 degrees   Time 6   Period Weeks   Status New   PT LONG TERM GOAL #3    Title Perform ADL's with pain not > 3/10   Time 6   Period Weeks   Status New   PT LONG TERM GOAL #4   Title Left shoulder strength= 5/5.   Time 6   Period Weeks   Status New               Problem List Patient Active Problem List   Diagnosis Date Noted  . Neck pain, bilateral 12/31/2013  . Neuropathy 12/31/2013  . Vitamin B 12 deficiency 12/31/2013  . Abnormality of gait 06/07/2013  . Esophageal ulcer with bleeding 04/20/2013  . S/P left and ritght THA, AA 07/21/2011  . Special screening for malignant neoplasms, colon 01/03/2011  . Personal history of colonic polyps 01/03/2011  . COPD GOLD I 08/24/2010  . GI BLEED 11/17/2009  . ESOPHAGEAL REFLUX 08/25/2009  . BLOOD IN STOOL-MELENA 08/25/2009  . COLONIC POLYPS 07/17/2008  . ANEMIA 07/17/2008  . HYPOTENSION 07/17/2008  . ESOPHAGITIS 07/17/2008  . DIVERTICULOSIS, MILD 07/17/2008  . DEGENERATIVE JOINT DISEASE 07/17/2008  . OSTEOPOROSIS 07/17/2008  . BENIGN PROSTATIC HYPERTROPHY, HX OF 07/17/2008  . HYPERPROLACTINEMIA 10/24/2007  . PITUITARY ADENOMA 06/25/2007  . HYPOTHYROIDISM 06/25/2007  . HYPERLIPIDEMIA 06/22/2007  . CORONARY ARTERY DISEASE 06/22/2007    APPLEGATE, [place] MPT 06/12/2014, 11:51 AM  [facility] [address] [place], [place], [address] Phone: [phone]   Fax:  [phone]

## 2014-06-19 ENCOUNTER — Ambulatory Visit: Payer: Medicare Other | Admitting: Physical Therapy

## 2014-06-19 DIAGNOSIS — M542 Cervicalgia: Secondary | ICD-10-CM | POA: Diagnosis not present

## 2014-06-19 DIAGNOSIS — M436 Torticollis: Secondary | ICD-10-CM | POA: Diagnosis not present

## 2014-06-19 NOTE — Therapy (Signed)
[name] [place], [place], [address] Phone: [phone]   Fax:  [phone]  Physical Therapy Treatment  Patient Details  Name: [name] MRN: [id number] Date of Birth: 08/16/1932 Referring Provider:  Chipper Herb, MD  Encounter Date: 06/19/2014      PT End of Session - 06/19/14 1527    Visit Number 8   Number of Visits 12   Date for PT Re-Evaluation 07/10/14   PT Start Time 0488   PT Stop Time 1607   PT Time Calculation (min) 52 min   Activity Tolerance Patient tolerated treatment well   Behavior During Therapy [facility] for tasks assessed/performed      Past Medical History  Diagnosis Date  . Emphysema   . CAD (coronary artery disease)     left main had  a 20% stenosis.  Left anterior descending had 40-50% proximal   stenosis.  Left circumflex had an 80% proximal stenosis.  The  first obtuse marginal artery had a 60% mid stenosis.  The  right  coronary artery  had  a 50-60% proximal stenosis with a 60-70%  mid stenosis  The patient was managed medically.   Marland Kitchen COPD (chronic obstructive pulmonary disease)   . Hypothyroidism   . Anemia   . Blood transfusion june 2011, 5 units given    july 2011 some units given  . Enlarged prostate     elevated psa recently  . GERD (gastroesophageal reflux disease)   . Arthritis   . Complication of anesthesia     likes spinal due to copd  . Dizziness     occasional  . Shortness of breath     with exertion  . Mass, scrotum     L scrotum - Sees Dr. Jeffie Pollock  . Abnormality of gait 06/07/2013  . Pituitary macroadenoma   . Restless legs syndrome (RLS)   . Blood transfusion without reported diagnosis   . Allergy   . Asthma   . Cataract     bilateral removal of cateracts  . Depression   . Emphysema of lung   . Oxygen deficiency     has o2 at home if needed    Past Surgical History  Procedure Laterality Date  . Coronary stent placement  5 yrs ago  . Hiatal hernia repair   01-04-2008  . Foot surgery  1994 left, 2002 right foot    bilateral foot reconstruciton  . Abdominal hernia repair   2008  . Cataract extraction both eyes    . Total hip arthroplasty  07/21/2011    Procedure: TOTAL HIP ARTHROPLASTY ANTERIOR APPROACH;  Surgeon: Mauri Pole, MD;  Location: [facility];  Service: Orthopedics;  Laterality: Left;  . Coronary angioplasty    . Total hip arthroplasty Right 09/07/2012    Procedure: RIGHT TOTAL HIP ARTHROPLASTY ANTERIOR APPROACH;  Surgeon: Mcarthur Rossetti, MD;  Location: [facility];  Service: Orthopedics;  Laterality: Right;  . Foot surgery      reconstruction of both feet  . Esophagogastroduodenoscopy N/A 04/20/2013    Procedure: ESOPHAGOGASTRODUODENOSCOPY (EGD);  Surgeon: Inda Castle, MD;  Location: [facility];  Service: Endoscopy;  Laterality: N/A;  . Colonoscopy    . Upper gastrointestinal endoscopy      There were no vitals filed for this visit.  Visit Diagnosis:  Neck pain  Stiff neck      Subjective Assessment - 06/19/14 1555    Subjective Patient states that he feels like he is  doing the best he has in a while. Would like to make today's visit his last based on his self reported improvement. Reaching and driving has become much easier.   Patient Stated Goals Get out of pain and use left UE better.   Currently in Pain? No/denies            [facility] PT Assessment - Jul 17, 2014 0001    Assessment   Medical Diagnosis Left sided neck pain/left shoulder pain.   AROM   Overall AROM Comments AROM bilateral cervical rotation= 40 deg   Strength   Overall Strength Within functional limits for tasks performed   Overall Strength Comments L shoulder flex 5/5, L shoulder abduct 5/5                     [facility] Adult PT Treatment/Exercise - Jul 17, 2014 0001    Modalities   Modalities Traction   Traction   Type of Traction Cervical   Min (lbs) 5   Max (lbs) 19   Hold Time 99   Rest Time 5   Time 12   Manual Therapy   Manual  Therapy Myofascial release   Myofascial Release STW to bilateral upper trapezius region to decrease tightness                  PT Short Term Goals - 07/17/14 1557    PT SHORT TERM GOAL #1   Title Ind with initial HEP.   Time 1   Period Weeks   Status Achieved           PT Long Term Goals - 07/17/2014 1520    PT LONG TERM GOAL #1   Title Ind with advanced HEP.   Time 6   Period Weeks   Status Not Met   PT LONG TERM GOAL #2   Title Bilateral active cervical rotation= 60 degrees   Time 6   Period Weeks   Status Not Met   PT LONG TERM GOAL #3   Title Perform ADL's with pain not > 3/10   Time 6   Period Weeks   Status Achieved   PT LONG TERM GOAL #4   Title Left shoulder strength= 5/5.   Time 6   Period Weeks   Status Achieved               Plan - Jul 17, 2014 1557    Clinical Impression Statement Patient did well today during today's treatment. Patient has achieved LT goal #4 and #5. Denied pain following traction. Tightness noted along bilateral upper trapezius. L shoulder flexion MMT 5/5 as well as L shoulder abduction MMT 5/5.   PT Treatment/Interventions Electrical Stimulation;Traction;Manual techniques;Therapeutic exercise   PT Next Visit Plan Communicate to MPT regarding patient's discharge   Consulted and Agree with Plan of Care Patient          G-Codes - 2014/07/17 1759    Functional Assessment Tool Used Clinical Judgement.   Functional Limitation Mobility: Walking and moving around   Mobility: Walking and Moving Around Current Status [phone]) At least 20 percent but less than 40 percent impaired, limited or restricted   Mobility: Walking and Moving Around Goal Status [phone]) At least 20 percent but less than 40 percent impaired, limited or restricted   Mobility: Walking and Moving Around Discharge Status [phone]) At least 20 percent but less than 40 percent impaired, limited or restricted      Problem List Patient Active Problem List    Diagnosis Date  Noted  . Neck pain, bilateral 12/31/2013  . Neuropathy 12/31/2013  . Vitamin B 12 deficiency 12/31/2013  . Abnormality of gait 06/07/2013  . Esophageal ulcer with bleeding 04/20/2013  . S/P left and ritght THA, AA 07/21/2011  . Special screening for malignant neoplasms, colon 01/03/2011  . Personal history of colonic polyps 01/03/2011  . COPD GOLD I 08/24/2010  . GI BLEED 11/17/2009  . ESOPHAGEAL REFLUX 08/25/2009  . BLOOD IN STOOL-MELENA 08/25/2009  . COLONIC POLYPS 07/17/2008  . ANEMIA 07/17/2008  . HYPOTENSION 07/17/2008  . ESOPHAGITIS 07/17/2008  . DIVERTICULOSIS, MILD 07/17/2008  . DEGENERATIVE JOINT DISEASE 07/17/2008  . OSTEOPOROSIS 07/17/2008  . BENIGN PROSTATIC HYPERTROPHY, HX OF 07/17/2008  . HYPERPROLACTINEMIA 10/24/2007  . PITUITARY ADENOMA 06/25/2007  . HYPOTHYROIDISM 06/25/2007  . HYPERLIPIDEMIA 06/22/2007  . CORONARY ARTERY DISEASE 06/22/2007    PHYSICAL THERAPY DISCHARGE SUMMARY  Visits from Start of Care: 8  Current functional level related to goals / functional outcomes: Please see above.   Remaining deficits: Limitation of cervical ROM but patient vey pleased overall.   Education / Equipment: HEP. Plan: Patient agrees to discharge.  Patient goals were partially met. Patient is being discharged due to being pleased with the current functional level.  ?????      APP[place], [place] MPT 06/19/2014, 6:01 PM  [facility] [address] [place], [place], [address] Phone: [phone]   Fax:  [phone]

## 2014-06-19 NOTE — Therapy (Signed)
[name] [place], [place], [address] Phone: [phone]   Fax:  [phone]  Physical Therapy Treatment  Patient Details  Name: [name] MRN: [id number] Date of Birth: 08/16/1932 Referring Provider:  Chipper Herb, MD  Encounter Date: 06/19/2014      PT End of Session - 06/19/14 1527    Visit Number 8   Number of Visits 12   Date for PT Re-Evaluation 07/10/14   PT Start Time 0488   PT Stop Time 1607   PT Time Calculation (min) 52 min   Activity Tolerance Patient tolerated treatment well   Behavior During Therapy [facility] for tasks assessed/performed      Past Medical History  Diagnosis Date  . Emphysema   . CAD (coronary artery disease)     left main had  a 20% stenosis.  Left anterior descending had 40-50% proximal   stenosis.  Left circumflex had an 80% proximal stenosis.  The  first obtuse marginal artery had a 60% mid stenosis.  The  right  coronary artery  had  a 50-60% proximal stenosis with a 60-70%  mid stenosis  The patient was managed medically.   Marland Kitchen COPD (chronic obstructive pulmonary disease)   . Hypothyroidism   . Anemia   . Blood transfusion june 2011, 5 units given    july 2011 some units given  . Enlarged prostate     elevated psa recently  . GERD (gastroesophageal reflux disease)   . Arthritis   . Complication of anesthesia     likes spinal due to copd  . Dizziness     occasional  . Shortness of breath     with exertion  . Mass, scrotum     L scrotum - Sees Dr. Jeffie Pollock  . Abnormality of gait 06/07/2013  . Pituitary macroadenoma   . Restless legs syndrome (RLS)   . Blood transfusion without reported diagnosis   . Allergy   . Asthma   . Cataract     bilateral removal of cateracts  . Depression   . Emphysema of lung   . Oxygen deficiency     has o2 at home if needed    Past Surgical History  Procedure Laterality Date  . Coronary stent placement  5 yrs ago  . Hiatal hernia repair   01-04-2008  . Foot surgery  1994 left, 2002 right foot    bilateral foot reconstruciton  . Abdominal hernia repair   2008  . Cataract extraction both eyes    . Total hip arthroplasty  07/21/2011    Procedure: TOTAL HIP ARTHROPLASTY ANTERIOR APPROACH;  Surgeon: Mauri Pole, MD;  Location: [facility];  Service: Orthopedics;  Laterality: Left;  . Coronary angioplasty    . Total hip arthroplasty Right 09/07/2012    Procedure: RIGHT TOTAL HIP ARTHROPLASTY ANTERIOR APPROACH;  Surgeon: Mcarthur Rossetti, MD;  Location: [facility];  Service: Orthopedics;  Laterality: Right;  . Foot surgery      reconstruction of both feet  . Esophagogastroduodenoscopy N/A 04/20/2013    Procedure: ESOPHAGOGASTRODUODENOSCOPY (EGD);  Surgeon: Inda Castle, MD;  Location: [facility];  Service: Endoscopy;  Laterality: N/A;  . Colonoscopy    . Upper gastrointestinal endoscopy      There were no vitals filed for this visit.  Visit Diagnosis:  Neck pain  Stiff neck      Subjective Assessment - 06/19/14 1555    Subjective Patient states that he feels like he is  doing the best he has in a while. Would like to make today's visit his last based on his self reported improvement. Reaching and driving has become much easier.   Patient Stated Goals Get out of pain and use left UE better.   Currently in Pain? No/denies            [facility] PT Assessment - 06/19/14 0001    Assessment   Medical Diagnosis Left sided neck pain/left shoulder pain.   AROM   Overall AROM Comments AROM bilateral cervical rotation= 40 deg   Strength   Overall Strength Within functional limits for tasks performed   Overall Strength Comments L shoulder flex 5/5, L shoulder abduct 5/5                     [facility] Adult PT Treatment/Exercise - 06/19/14 0001    Modalities   Modalities Traction   Traction   Type of Traction Cervical   Min (lbs) 5   Max (lbs) 19   Hold Time 99   Rest Time 5   Time 12   Manual Therapy   Manual  Therapy Myofascial release   Myofascial Release STW to bilateral upper trapezius region to decrease tightness                  PT Short Term Goals - 06/19/14 1557    PT SHORT TERM GOAL #1   Title Ind with initial HEP.   Time 1   Period Weeks   Status Achieved           PT Long Term Goals - 06/19/14 1520    PT LONG TERM GOAL #1   Title Ind with advanced HEP.   Time 6   Period Weeks   Status Not Met   PT LONG TERM GOAL #2   Title Bilateral active cervical rotation= 60 degrees   Time 6   Period Weeks   Status Not Met   PT LONG TERM GOAL #3   Title Perform ADL's with pain not > 3/10   Time 6   Period Weeks   Status Achieved   PT LONG TERM GOAL #4   Title Left shoulder strength= 5/5.   Time 6   Period Weeks   Status Achieved               Plan - 06/19/14 1557    Clinical Impression Statement Patient did well today during today's treatment. Patient has achieved LT goal #4 and #5. Denied pain following traction. Tightness noted along bilateral upper trapezius. L shoulder flexion MMT 5/5 as well as L shoulder abduction MMT 5/5.   PT Treatment/Interventions Electrical Stimulation;Traction;Manual techniques;Therapeutic exercise   PT Next Visit Plan Communicate to MPT regarding patient's discharge   Consulted and Agree with Plan of Care Patient        Problem List Patient Active Problem List   Diagnosis Date Noted  . Neck pain, bilateral 12/31/2013  . Neuropathy 12/31/2013  . Vitamin B 12 deficiency 12/31/2013  . Abnormality of gait 06/07/2013  . Esophageal ulcer with bleeding 04/20/2013  . S/P left and ritght THA, AA 07/21/2011  . Special screening for malignant neoplasms, colon 01/03/2011  . Personal history of colonic polyps 01/03/2011  . COPD GOLD I 08/24/2010  . GI BLEED 11/17/2009  . ESOPHAGEAL REFLUX 08/25/2009  . BLOOD IN STOOL-MELENA 08/25/2009  . COLONIC POLYPS 07/17/2008  . ANEMIA 07/17/2008  . HYPOTENSION 07/17/2008  . ESOPHAGITIS  07/17/2008  .  DIVERTICULOSIS, MILD 07/17/2008  . DEGENERATIVE JOINT DISEASE 07/17/2008  . OSTEOPOROSIS 07/17/2008  . BENIGN PROSTATIC HYPERTROPHY, HX OF 07/17/2008  . HYPERPROLACTINEMIA 10/24/2007  . PITUITARY ADENOMA 06/25/2007  . HYPOTHYROIDISM 06/25/2007  . HYPERLIPIDEMIA 06/22/2007  . CORONARY ARTERY DISEASE 06/22/2007    Wynelle Fanny, PTA 06/19/2014, 5:06 PM  [name] [address] [place], [place], [address] Phone: [phone]   Fax:  [phone]

## 2014-06-25 DIAGNOSIS — M542 Cervicalgia: Secondary | ICD-10-CM | POA: Diagnosis not present

## 2014-06-25 DIAGNOSIS — M25512 Pain in left shoulder: Secondary | ICD-10-CM | POA: Diagnosis not present

## 2014-07-04 DIAGNOSIS — H35443 Age-related reticular degeneration of retina, bilateral: Secondary | ICD-10-CM | POA: Diagnosis not present

## 2014-07-21 ENCOUNTER — Encounter: Payer: Self-pay | Admitting: Family Medicine

## 2014-07-24 DIAGNOSIS — M7061 Trochanteric bursitis, right hip: Secondary | ICD-10-CM | POA: Diagnosis not present

## 2014-07-24 DIAGNOSIS — M25551 Pain in right hip: Secondary | ICD-10-CM | POA: Diagnosis not present

## 2014-08-03 ENCOUNTER — Encounter: Payer: Self-pay | Admitting: Family Medicine

## 2014-09-02 ENCOUNTER — Encounter: Payer: Self-pay | Admitting: Internal Medicine

## 2014-09-02 ENCOUNTER — Other Ambulatory Visit: Payer: Self-pay | Admitting: Endocrinology

## 2014-09-07 ENCOUNTER — Encounter: Payer: Self-pay | Admitting: Family Medicine

## 2014-09-08 ENCOUNTER — Ambulatory Visit (INDEPENDENT_AMBULATORY_CARE_PROVIDER_SITE_OTHER): Payer: Medicare Other | Admitting: Family

## 2014-09-08 ENCOUNTER — Encounter: Payer: Self-pay | Admitting: Family

## 2014-09-08 VITALS — BP 118/80 | Temp 96.0°F | Ht 67.0 in | Wt 178.0 lb

## 2014-09-08 DIAGNOSIS — S41101A Unspecified open wound of right upper arm, initial encounter: Secondary | ICD-10-CM

## 2014-09-08 DIAGNOSIS — S41111A Laceration without foreign body of right upper arm, initial encounter: Secondary | ICD-10-CM

## 2014-09-08 NOTE — Patient Instructions (Signed)
Skin Tear Care  A skin tear is a wound in which the top layer of skin has peeled off. This is a common problem with aging because the skin becomes thinner and more fragile as a person gets older. In addition, some medicines, such as oral corticosteroids, can lead to skin thinning if taken for long periods of time.   A skin tear is often repaired with tape or skin adhesive strips. This keeps the skin that has been peeled off in contact with the healthier skin beneath. Depending on the location of the wound, a bandage (dressing) may be applied over the tape or skin adhesive strips. Sometimes, during the healing process, the skin turns black and dies. Even when this happens, the torn skin acts as a good dressing until the skin underneath gets healthier and repairs itself.  HOME CARE INSTRUCTIONS   · Change dressings once per day or as directed by your caregiver.  ¨ Gently clean the skin tear and the area around the tear using saline solution or mild soap and water.  ¨ Do not rub the injured skin dry. Let the area air dry.  ¨ Apply petroleum jelly or an antibiotic cream or ointment to keep the tear moist. This will help the wound heal. Do not allow a scab to form.  ¨ If the dressing sticks before the next dressing change, moisten it with warm soapy water and gently remove it.  · Protect the injured skin until it has healed.  · Only take over-the-counter or prescription medicines as directed by your caregiver.  · Take showers or baths using warm soapy water. Apply a new dressing after the shower or bath.  · Keep all follow-up appointments as directed by your caregiver.    SEEK IMMEDIATE MEDICAL CARE IF:   · You have redness, swelling, or increasing pain in the skin tear.  · You have pus coming from the skin tear.  · You have chills.  · You have a red streak that goes away from the skin tear.  · You have a bad smell coming from the tear or dressing.  · You have a fever or persistent symptoms for more than 2-3 days.  · You  have a fever and your symptoms suddenly get worse.  MAKE SURE YOU:  · Understand these instructions.  · Will watch this condition.  · Will get help right away if your child is not doing well or gets worse.  Document Released: 11/09/2000 Document Revised: 11/09/2011 Document Reviewed: 08/29/2011  Exit[employer]® Patient Information ©2015 [employer]. This information is not intended to replace advice given to you by your health care provider. Make sure you discuss any questions you have with your health care provider.

## 2014-09-08 NOTE — Progress Notes (Signed)
   Subjective:    Patient ID: [name], male    DOB: March 12, 1932, 81 y.o.   MRN: [id number]  HPI Pt presents to the office today for a skin tear. Pt states he hit it on Saturday morning on a "storm door". Pt states it "bleed a lot on Saturday", but has now improved. Pt states he was dressing it BID and applying a xeroform occlusive gauze and states it has helped a "great deal". Pt denies any aspirin or warfarin use. Pt denies any headache, palpitations, SOB, or edema at this time.     Review of Systems  Constitutional: Negative.   HENT: Negative.   Respiratory: Negative.   Cardiovascular: Negative.   Gastrointestinal: Negative.   Endocrine: Negative.   Genitourinary: Negative.   Musculoskeletal: Negative.   Neurological: Negative.   Hematological: Negative.   Psychiatric/Behavioral: Negative.   All other systems reviewed and are negative.      Objective:   Physical Exam  Constitutional: He is oriented to person, place, and time. He appears well-developed and well-nourished. No distress.  HENT:  Head: Normocephalic.  Eyes: Pupils are equal, round, and reactive to light. Right eye exhibits no discharge. Left eye exhibits no discharge.  Neck: Normal range of motion. Neck supple. No thyromegaly present.  Cardiovascular: Normal rate, regular rhythm, normal heart sounds and intact distal pulses.   No murmur heard. Pulmonary/Chest: Effort normal and breath sounds normal. No respiratory distress. He has no wheezes.  Abdominal: Soft. Bowel sounds are normal. He exhibits no distension. There is no tenderness.  Musculoskeletal: Normal range of motion. He exhibits no edema or tenderness.  Neurological: He is alert and oriented to person, place, and time. He has normal reflexes. No cranial nerve deficit.  Skin: Skin is warm and dry. No rash noted. No erythema.  Skin tear on right forearm with slight ecchymosis   Psychiatric: He has a normal mood and affect. His behavior is normal.  Judgment and thought content normal.  Vitals reviewed.   BP 118/80 mmHg  Temp(Src) 96 F (35.6 C) (Oral)  Ht 5\' 7"  (1.702 m)  Wt 178 lb (80.74 kg)  BMI 27.87 kg/m2       Assessment & Plan:  1. Skin tear of right upper arm without complication, initial encounter -Continue changing dressing BID -S/S of infection discussed -RTO as needed  Evelina Dun, FNP

## 2014-09-08 NOTE — Telephone Encounter (Signed)
Patient called and appointment given for today at 11:25 with [facility].

## 2014-09-16 ENCOUNTER — Ambulatory Visit (INDEPENDENT_AMBULATORY_CARE_PROVIDER_SITE_OTHER): Payer: Medicare Other | Admitting: Neurology

## 2014-09-16 ENCOUNTER — Encounter: Payer: Self-pay | Admitting: Neurology

## 2014-09-16 VITALS — BP 120/77 | HR 75 | Ht 67.0 in | Wt 177.4 lb

## 2014-09-16 DIAGNOSIS — D352 Benign neoplasm of pituitary gland: Secondary | ICD-10-CM | POA: Diagnosis not present

## 2014-09-16 DIAGNOSIS — R269 Unspecified abnormalities of gait and mobility: Secondary | ICD-10-CM | POA: Diagnosis not present

## 2014-09-16 DIAGNOSIS — D353 Benign neoplasm of craniopharyngeal duct: Secondary | ICD-10-CM

## 2014-09-16 NOTE — Patient Instructions (Signed)

## 2014-09-16 NOTE — Progress Notes (Signed)
Reason for visit: Gait disorder  [name] is an 81 y.o. male  History of present illness:  [name] is an 81 year old right-handed white male with a history of a prolactin secreting pituitary macroadenoma. The patient has a history of a chronic gait disorder, but he believes that this issue has been stable over time. The patient does not use any assistive device inside the house, he will use a cane outside the house. He has not had any recent falls. The patient does have some episodes of feeling "foggy headed" at times, but he is not sure that this may be related to his medications or not. The patient has had bilateral total hip replacements and he has had ankle surgeries bilaterally. The patient believes that this may be a factor in his balance. He will shuffle his feet at times, and he may stumble. He returns for further evaluation. A recent MRI of the brain and pituitary gland shows no enlargement of the tumor. His endocrinologist treats him with bromocriptine.  Past Medical History  Diagnosis Date  . Emphysema   . CAD (coronary artery disease)     left main had  a 20% stenosis.  Left anterior descending had 40-50% proximal   stenosis.  Left circumflex had an 80% proximal stenosis.  The  first obtuse marginal artery had a 60% mid stenosis.  The  right  coronary artery  had  a 50-60% proximal stenosis with a 60-70%  mid stenosis  The patient was managed medically.   Marland Kitchen COPD (chronic obstructive pulmonary disease)   . Hypothyroidism   . Anemia   . Blood transfusion june 2011, 5 units given    july 2011 some units given  . Enlarged prostate     elevated psa recently  . GERD (gastroesophageal reflux disease)   . Arthritis   . Complication of anesthesia     likes spinal due to copd  . Dizziness     occasional  . Shortness of breath     with exertion  . Mass, scrotum     L scrotum - Sees Dr. Jeffie Pollock  . Abnormality of gait 06/07/2013  . Pituitary macroadenoma   . Restless  legs syndrome (RLS)   . Blood transfusion without reported diagnosis   . Allergy   . Asthma   . Cataract     bilateral removal of cateracts  . Depression   . Emphysema of lung   . Oxygen deficiency     has o2 at home if needed    Past Surgical History  Procedure Laterality Date  . Coronary stent placement  5 yrs ago  . Hiatal hernia repair  01-04-2008  . Foot surgery  1994 left, 2002 right foot    bilateral foot reconstruciton  . Abdominal hernia repair   2008  . Cataract extraction both eyes    . Total hip arthroplasty  07/21/2011    Procedure: TOTAL HIP ARTHROPLASTY ANTERIOR APPROACH;  Surgeon: Mauri Pole, MD;  Location: [facility];  Service: Orthopedics;  Laterality: Left;  . Coronary angioplasty    . Total hip arthroplasty Right 09/07/2012    Procedure: RIGHT TOTAL HIP ARTHROPLASTY ANTERIOR APPROACH;  Surgeon: Mcarthur Rossetti, MD;  Location: [facility];  Service: Orthopedics;  Laterality: Right;  . Foot surgery      reconstruction of both feet  . Esophagogastroduodenoscopy N/A 04/20/2013    Procedure: ESOPHAGOGASTRODUODENOSCOPY (EGD);  Surgeon: Inda Castle, MD;  Location: [facility];  Service: Endoscopy;  Laterality: N/A;  . Colonoscopy    . Upper gastrointestinal endoscopy      Family History  Problem Relation Age of Onset  . Heart disease Sister   . Heart failure Father   . Colon cancer Neg Hx   . Esophageal cancer Neg Hx   . Rectal cancer Neg Hx   . Stomach cancer Neg Hx     Social history:  reports that he has never smoked. He has never used smokeless tobacco. He reports that he does not drink alcohol or use illicit drugs.    Allergies  Allergen Reactions  . Lortab [Hydrocodone-Acetaminophen] Nausea And Vomiting  . Penicillins Other (See Comments)    Light headed  . Zetia [Ezetimibe] Other (See Comments)    Muscle weakness  . Zocor [Simvastatin] Nausea Only and Other (See Comments)    muscle weakness    Medications:  Prior to Admission  medications   Medication Sig Start Date End Date Taking? Authorizing Provider  albuterol (PROVENTIL HFA;VENTOLIN HFA) 108 (90 BASE) MCG/ACT inhaler Inhale 2 puffs into the lungs every 6 (six) hours as needed for wheezing or shortness of breath.   Yes Historical Provider, MD  bromocriptine (PARLODEL) 2.5 MG tablet Take 1 tablet (2.5 mg total) by mouth 2 (two) times daily. 05/15/14  Yes Chipper Herb, MD  budesonide-formoterol [facility] 160-4.5 MCG/ACT inhaler Inhale 2 puffs into the lungs 2 (two) times daily. 06/06/14  Yes Chipper Herb, MD  calcium carbonate (OS-CAL) 600 MG TABS tablet Take 600 mg by mouth 2 (two) times daily with a meal.   Yes Historical Provider, MD  cholecalciferol (VITAMIN D) 1000 UNITS tablet Take 1,000 Units by mouth 2 (two) times daily.   Yes Historical Provider, MD  cyanocobalamin 100 MCG tablet Take 100 mcg by mouth daily.   Yes Historical Provider, MD  doxycycline (VIBRA-TABS) 100 MG tablet TAKE (1) TABLET TWICE A DAY. 03/11/14  Yes Chipper Herb, MD  ferrous sulfate 325 (65 FE) MG tablet Take 325 mg by mouth daily.    Yes Historical Provider, MD  guaiFENesin (MUCINEX) 600 MG 12 hr tablet Take 600 mg by mouth 2 (two) times daily.    Yes Historical Provider, MD  levothyroxine (SYNTHROID, LEVOTHROID) 50 MCG tablet TAKE 1 TABLET IN THE MORNING 09/02/14  Yes Renato Shin, MD  magnesium oxide (MAG-OX) 400 MG tablet Take 400 mg by mouth daily.   Yes Historical Provider, MD  montelukast (SINGULAIR) 10 MG tablet TAKE 1 TABLET DAILY   Yes Chipper Herb, MD  nitroGLYCERIN (NITROSTAT) 0.4 MG SL tablet Place 0.4 mg under the tongue every 5 (five) minutes as needed for chest pain.   Yes Historical Provider, MD  pantoprazole (PROTONIX) 40 MG tablet Take 1 tablet (40 mg total) by mouth 2 (two) times daily before a meal. 01/27/14  Yes Jerene Bears, MD  polyethylene glycol (MIRALAX / GLYCOLAX) packet Take 17 g by mouth daily. 04/22/13  Yes Hosie Poisson, MD  sucralfate (CARAFATE) 1 G tablet  Take 1 tablet (1 g total) by mouth 4 (four) times daily -  with meals and at bedtime. 02/07/14  Yes Jerene Bears, MD  tamsulosin (FLOMAX) 0.4 MG CAPS capsule Take 1 capsule (0.4 mg total) by mouth daily. 05/15/14  Yes Chipper Herb, MD  tetrahydrozoline (VISINE) 0.05 % ophthalmic solution Place 1 drop into both eyes as needed (For dry eyes).   Yes Historical Provider, MD  traMADol (ULTRAM) 50 MG tablet Take 50 mg by mouth  every 6 (six) hours as needed for pain.   Yes Historical Provider, MD  vitamin E (VITAMIN E) 400 UNIT capsule Take 400 Units by mouth daily.   Yes Historical Provider, MD    ROS:  Out of a complete 14 system review of symptoms, the patient complains only of the following symptoms, and all other reviewed systems are negative.  Fatigue Ringing in the ears Eye redness Wheezing, shortness of breath, chest tightness Cold intolerance, heat intolerance Constipation Restless legs Difficulty urinating Joint pain, back pain, muscle cramps, walking difficulty, neck pain, neck stiffness Bruising easily, dizziness, numbness, weakness Depression, anxiety  Blood pressure 120/77, pulse 75, height 5\' 7"  (1.702 m), weight 177 lb 6.4 oz (80.468 kg).  Physical Exam  General: The patient is alert and cooperative at the time of the examination.  Skin: No significant peripheral edema is noted.   Neurologic Exam  Mental status: The patient is alert and oriented x 3 at the time of the examination. The patient has apparent normal recent and remote memory, with an apparently normal attention span and concentration ability.   Cranial nerves: Facial symmetry is present. Speech is normal, no aphasia or dysarthria is noted. Extraocular movements are full. Visual fields are full.  Motor: The patient has good strength in all 4 extremities.  Sensory examination: Soft touch sensation is symmetric on the face, arms, and legs.  Coordination: The patient has good finger-nose-finger and  heel-to-shin bilaterally.  Gait and station: The patient has a slightly shuffling gait, good arm swing seen bilaterally. Posture slightly stooped. Tandem gait is slightly unsteady. Romberg is negative. No drift is seen.  Reflexes: Deep tendon reflexes are symmetric.   MRI brain/pituitary gland 03/28/2014:  IMPRESSION:  Abnormal MRI brain and pituitary (with and without) demonstrating: 1. Slightly hypoenhancing pituitary mass, slightly extending towards the left side, measuring 14x17x19mm (APxtransxSI). The mass extends towards the sphenoid sinus, and slightly into the left cavernous sinus. 2. No acute findings.  3. No significant change from MRI on 11/28/12.    Assessment/Plan:  1. Gait disorder  2. Prolactin secreting macroadenoma of pituitary gland  The patient seems to be relatively stable with his offering. If there is any decline, he may benefit from physical therapy. The patient will follow-up through this office on an as-needed basis at this time, but he will contact me if there are any new issues.  Jill Alexanders MD 09/16/2014 8:05 PM  [facility] [address] [place], [place] [address]  Phone [phone] Fax [phone]

## 2014-09-17 ENCOUNTER — Ambulatory Visit (INDEPENDENT_AMBULATORY_CARE_PROVIDER_SITE_OTHER): Payer: Medicare Other | Admitting: Cardiology

## 2014-09-17 ENCOUNTER — Encounter: Payer: Self-pay | Admitting: Cardiology

## 2014-09-17 VITALS — BP 98/58 | HR 65 | Ht 67.0 in | Wt 180.0 lb

## 2014-09-17 DIAGNOSIS — I2510 Atherosclerotic heart disease of native coronary artery without angina pectoris: Secondary | ICD-10-CM

## 2014-09-17 NOTE — Progress Notes (Signed)
HPI The patient presents for followup of his known coronary disease. He had a negative stress perfusion study prior to hip surgery in 2013. Since then he's had no new symptoms. The patient denies any new symptoms such as chest discomfort, neck or arm discomfort. There has been no new shortness of breath, PND or orthopnea. There have been no reported palpitations, presyncope or syncope.  He is limited by gait disturbance and dizziness related to treatment for a pituitary tumor in part.  When I last saw him he had had GI bleeding but he has had no further bleeding.  He has had some rare very slight blood that he coughs up but no fevers or chills.  Allergies  Allergen Reactions  . Lortab [Hydrocodone-Acetaminophen] Nausea And Vomiting  . Penicillins Other (See Comments)    Light headed  . Zetia [Ezetimibe] Other (See Comments)    Muscle weakness  . Zocor [Simvastatin] Nausea Only and Other (See Comments)    muscle weakness    Current Outpatient Prescriptions  Medication Sig Dispense Refill  . albuterol (PROVENTIL HFA;VENTOLIN HFA) 108 (90 BASE) MCG/ACT inhaler Inhale 2 puffs into the lungs every 6 (six) hours as needed for wheezing or shortness of breath.    . bromocriptine (PARLODEL) 2.5 MG tablet Take 1 tablet (2.5 mg total) by mouth 2 (two) times daily. 60 tablet 6  . budesonide-formoterol (SYMBICORT) 160-4.5 MCG/ACT inhaler Inhale 2 puffs into the lungs 2 (two) times daily. 1 Inhaler 2  . calcium carbonate (OS-CAL) 600 MG TABS tablet Take 600 mg by mouth 2 (two) times daily with a meal.    . cholecalciferol (VITAMIN D) 1000 UNITS tablet Take 1,000 Units by mouth 2 (two) times daily.    . cyanocobalamin 100 MCG tablet Take 100 mcg by mouth daily.    . ferrous sulfate 325 (65 FE) MG tablet Take 325 mg by mouth daily.     Marland Kitchen guaiFENesin (MUCINEX) 600 MG 12 hr tablet Take 600 mg by mouth 2 (two) times daily.     Marland Kitchen levothyroxine (SYNTHROID, LEVOTHROID) 50 MCG tablet TAKE 1 TABLET IN THE  MORNING 30 tablet 0  . magnesium oxide (MAG-OX) 400 MG tablet Take 400 mg by mouth daily.    . montelukast (SINGULAIR) 10 MG tablet TAKE 1 TABLET DAILY 30 tablet 3  . nitroGLYCERIN (NITROSTAT) 0.4 MG SL tablet Place 0.4 mg under the tongue every 5 (five) minutes as needed for chest pain.    . pantoprazole (PROTONIX) 40 MG tablet Take 1 tablet (40 mg total) by mouth 2 (two) times daily before a meal. 60 tablet 3  . polyethylene glycol (MIRALAX / GLYCOLAX) packet Take 17 g by mouth daily. 14 each 0  . sucralfate (CARAFATE) 1 G tablet Take 1 tablet (1 g total) by mouth 4 (four) times daily -  with meals and at bedtime. 120 tablet 2  . tamsulosin (FLOMAX) 0.4 MG CAPS capsule Take 1 capsule (0.4 mg total) by mouth daily. 30 capsule 6  . tetrahydrozoline (VISINE) 0.05 % ophthalmic solution Place 1 drop into both eyes as needed (For dry eyes).    . traMADol (ULTRAM) 50 MG tablet Take 50 mg by mouth every 6 (six) hours as needed for pain.    . vitamin E (VITAMIN E) 400 UNIT capsule Take 400 Units by mouth daily.     No current facility-administered medications for this visit.    Past Medical History  Diagnosis Date  . Emphysema   . CAD (coronary artery  disease)     left main had  a 20% stenosis.  Left anterior descending had 40-50% proximal   stenosis.  Left circumflex had an 80% proximal stenosis.  The  first obtuse marginal artery had a 60% mid stenosis.  The  right  coronary artery  had  a 50-60% proximal stenosis with a 60-70%  mid stenosis  The patient was managed medically.   Marland Kitchen COPD (chronic obstructive pulmonary disease)   . Hypothyroidism   . Anemia   . Blood transfusion june 2011, 5 units given    july 2011 some units given  . Enlarged prostate     elevated psa recently  . GERD (gastroesophageal reflux disease)   . Arthritis   . Complication of anesthesia     likes spinal due to copd  . Dizziness     occasional  . Mass, scrotum     L scrotum - Sees Dr. Jeffie Pollock  . Abnormality of gait  06/07/2013  . Pituitary macroadenoma   . Restless legs syndrome (RLS)   . Asthma   . Cataract     bilateral removal of cateracts  . Depression     Past Surgical History  Procedure Laterality Date  . Coronary stent placement  5 yrs ago  . Hiatal hernia repair  01-04-2008  . Foot surgery  1994 left, 2002 right foot    bilateral foot reconstruciton  . Abdominal hernia repair   2008  . Cataract extraction both eyes    . Total hip arthroplasty  07/21/2011    Procedure: TOTAL HIP ARTHROPLASTY ANTERIOR APPROACH;  Surgeon: Mauri Pole, MD;  Location: [facility];  Service: Orthopedics;  Laterality: Left;  . Coronary angioplasty    . Total hip arthroplasty Right 09/07/2012    Procedure: RIGHT TOTAL HIP ARTHROPLASTY ANTERIOR APPROACH;  Surgeon: Mcarthur Rossetti, MD;  Location: [facility];  Service: Orthopedics;  Laterality: Right;  . Foot surgery      reconstruction of both feet  . Esophagogastroduodenoscopy N/A 04/20/2013    Procedure: ESOPHAGOGASTRODUODENOSCOPY (EGD);  Surgeon: Inda Castle, MD;  Location: [facility];  Service: Endoscopy;  Laterality: N/A;  . Colonoscopy    . Upper gastrointestinal endoscopy      ROS: Positive for dizziness and balance problems.  Otherwise as stated in the HPI and negative for all other systems.  PHYSICAL EXAM BP 98/58 mmHg  Pulse 65  Ht 5\' 7"  (1.610 m)  Wt 180 lb (81.647 kg)  BMI 28.19 kg/m2 GENERAL:  Well appearing HEENT:  Pupils equal round and reactive, fundi not visualized, oral mucosa unremarkable NECK:  No jugular venous distention, waveform within normal limits, carotid upstroke brisk and symmetric, no bruits, no thyromegaly LYMPHATICS:  No cervical, inguinal adenopathy LUNGS:  Clear to auscultation bilaterally BACK:  No CVA tenderness CHEST:  Unremarkable HEART:  PMI not displaced or sustained,S1 and S2 within normal limits, no S3, no S4, no clicks, no rubs, no murmurs ABD:  Flat, positive bowel sounds normal in frequency in pitch, no  bruits, no rebound, no guarding, no midline pulsatile mass, no hepatomegaly, no splenomegaly EXT:  2 plus pulses upper, decreased DP/PT bilateral, mild edema, no cyanosis no clubbing  EKG:  Sinus rhythm, RSR' V1,  rate 65, axis within normal limits, intervals within normal limits, no acute ST-T wave changes. 09/17/2014  ASSESSMENT AND PLAN  CORONARY ARTERY DISEASE -  The patient has no new sypmtoms.  No further cardiovascular testing is indicated.  We will continue with aggressive risk  reduction and meds as listed.  HYPERLIPIDEMIA -  This is followed by Clois Dupes, MD who watches this closely.

## 2014-09-17 NOTE — Patient Instructions (Signed)
Medication Instructions:  Your physician recommends that you continue on your current medications as directed. Please refer to the Current Medication list given to you today.  Follow-Up: Follow up in 1 year with Dr. Percival Spanish in [place].  You will receive a letter in the mail 2 months before you are due.  Please call us when you receive this letter to schedule your follow up appointment.  Thank you for choosing [facility]!!

## 2014-09-22 ENCOUNTER — Other Ambulatory Visit: Payer: Self-pay | Admitting: Pharmacist

## 2014-09-22 ENCOUNTER — Telehealth: Payer: Self-pay | Admitting: Pharmacist

## 2014-09-22 DIAGNOSIS — M810 Age-related osteoporosis without current pathological fracture: Secondary | ICD-10-CM

## 2014-09-22 NOTE — Telephone Encounter (Signed)
Next reclast dose is due 10/16/2014 or after.  Notified patient that we need labs no more than 1 month prior to administration.  Orders placed and patient to come in this week or next week.

## 2014-09-23 ENCOUNTER — Other Ambulatory Visit (INDEPENDENT_AMBULATORY_CARE_PROVIDER_SITE_OTHER): Payer: Medicare Other

## 2014-09-23 DIAGNOSIS — M810 Age-related osteoporosis without current pathological fracture: Secondary | ICD-10-CM | POA: Diagnosis not present

## 2014-09-23 NOTE — Progress Notes (Signed)
Lab only 

## 2014-09-24 ENCOUNTER — Encounter: Payer: Self-pay | Admitting: Pharmacist

## 2014-09-24 LAB — BMP8+EGFR
BUN/Creatinine Ratio: 12 (ref 10–22)
BUN: 13 mg/dL (ref 8–27)
CO2: 22 mmol/L (ref 18–29)
Calcium: 9 mg/dL (ref 8.6–10.2)
Chloride: 103 mmol/L (ref 97–108)
Creatinine, Ser: 1.05 mg/dL (ref 0.76–1.27)
GFR calc Af Amer: 77 mL/min/{1.73_m2} (ref >59)
GFR calc non Af Amer: 67 mL/min/{1.73_m2} (ref >59)
Glucose: 85 mg/dL (ref 65–99)
Potassium: 4.3 mmol/L (ref 3.5–5.2)
Sodium: 139 mmol/L (ref 134–144)

## 2014-09-24 LAB — PHOSPHORUS: Phosphorus: 3.1 mg/dL (ref 2.5–4.5)

## 2014-09-24 LAB — MAGNESIUM: Magnesium: 2 mg/dL (ref 1.6–2.3)

## 2014-10-07 ENCOUNTER — Encounter: Payer: Self-pay | Admitting: Family Medicine

## 2014-10-07 ENCOUNTER — Ambulatory Visit (INDEPENDENT_AMBULATORY_CARE_PROVIDER_SITE_OTHER): Payer: Medicare Other | Admitting: Family Medicine

## 2014-10-07 VITALS — BP 103/64 | HR 70 | Temp 96.7°F | Ht 67.0 in | Wt 177.0 lb

## 2014-10-07 DIAGNOSIS — N400 Enlarged prostate without lower urinary tract symptoms: Secondary | ICD-10-CM

## 2014-10-07 DIAGNOSIS — E559 Vitamin D deficiency, unspecified: Secondary | ICD-10-CM | POA: Diagnosis not present

## 2014-10-07 DIAGNOSIS — I2510 Atherosclerotic heart disease of native coronary artery without angina pectoris: Secondary | ICD-10-CM | POA: Diagnosis not present

## 2014-10-07 DIAGNOSIS — Z87898 Personal history of other specified conditions: Secondary | ICD-10-CM

## 2014-10-07 DIAGNOSIS — Z8639 Personal history of other endocrine, nutritional and metabolic disease: Secondary | ICD-10-CM

## 2014-10-07 DIAGNOSIS — E039 Hypothyroidism, unspecified: Secondary | ICD-10-CM | POA: Diagnosis not present

## 2014-10-07 DIAGNOSIS — K219 Gastro-esophageal reflux disease without esophagitis: Secondary | ICD-10-CM | POA: Diagnosis not present

## 2014-10-07 DIAGNOSIS — E785 Hyperlipidemia, unspecified: Secondary | ICD-10-CM

## 2014-10-07 NOTE — Progress Notes (Signed)
Subjective:    Patient ID: [name], male    DOB: 1932-03-27, 81 y.o.   MRN: [id number]  HPI Pt here for follow up and management of chronic medical problems which includes hypothyroid, hyperlipidemia and GERD. He is taking medications regularly. The patient does complain of bilateral shoulder pain and stiffness. He has had this some in the past and has received injection for this in the past. He also complains of some infections in the penis and blistering in the groin and buttock. All of these appear to have resolved and improved with using antibiotic ointment. There is no active infection today. He denies any chest pain or shortness of breath. He has coughed up some blood in response to using some medications and he promptly stopped the medications and does use Carafate in addition to his Protonix. He is currently doing okay with no esophagitis heartburn or any kind of blood loss noted. He is passing his water but slowly.       Patient Active Problem List   Diagnosis Date Noted  . Neck pain, bilateral 12/31/2013  . Neuropathy 12/31/2013  . Vitamin B 12 deficiency 12/31/2013  . Abnormality of gait 06/07/2013  . Esophageal ulcer with bleeding 04/20/2013  . S/P left and ritght THA, AA 07/21/2011  . Special screening for malignant neoplasms, colon 01/03/2011  . Personal history of colonic polyps 01/03/2011  . COPD GOLD I 08/24/2010  . GI BLEED 11/17/2009  . ESOPHAGEAL REFLUX 08/25/2009  . BLOOD IN STOOL-MELENA 08/25/2009  . COLONIC POLYPS 07/17/2008  . ANEMIA 07/17/2008  . HYPOTENSION 07/17/2008  . ESOPHAGITIS 07/17/2008  . DIVERTICULOSIS, MILD 07/17/2008  . DEGENERATIVE JOINT DISEASE 07/17/2008  . OSTEOPOROSIS 07/17/2008  . BENIGN PROSTATIC HYPERTROPHY, HX OF 07/17/2008  . HYPERPROLACTINEMIA 10/24/2007  . PITUITARY ADENOMA 06/25/2007  . HYPOTHYROIDISM 06/25/2007  . HYPERLIPIDEMIA 06/22/2007  . Coronary atherosclerosis 06/22/2007   Outpatient Encounter Prescriptions as  of 10/07/2014  Medication Sig  . albuterol (PROVENTIL HFA;VENTOLIN HFA) 108 (90 BASE) MCG/ACT inhaler Inhale 2 puffs into the lungs every 6 (six) hours as needed for wheezing or shortness of breath.  . bromocriptine (PARLODEL) 2.5 MG tablet Take 1 tablet (2.5 mg total) by mouth 2 (two) times daily.  . budesonide-formoterol (SYMBICORT) 160-4.5 MCG/ACT inhaler Inhale 2 puffs into the lungs 2 (two) times daily.  . calcium carbonate (OS-CAL) 600 MG TABS tablet Take 600 mg by mouth 2 (two) times daily with a meal.  . cholecalciferol (VITAMIN D) 1000 UNITS tablet Take 1,000 Units by mouth 2 (two) times daily.  . cyanocobalamin 100 MCG tablet Take 100 mcg by mouth daily.  . ferrous sulfate 325 (65 FE) MG tablet Take 325 mg by mouth daily.   Marland Kitchen guaiFENesin (MUCINEX) 600 MG 12 hr tablet Take 600 mg by mouth 2 (two) times daily.   Marland Kitchen levothyroxine (SYNTHROID, LEVOTHROID) 50 MCG tablet TAKE 1 TABLET IN THE MORNING  . magnesium oxide (MAG-OX) 400 MG tablet Take 400 mg by mouth daily.  . montelukast (SINGULAIR) 10 MG tablet TAKE 1 TABLET DAILY  . nitroGLYCERIN (NITROSTAT) 0.4 MG SL tablet Place 0.4 mg under the tongue every 5 (five) minutes as needed for chest pain.  . pantoprazole (PROTONIX) 40 MG tablet Take 1 tablet (40 mg total) by mouth 2 (two) times daily before a meal.  . polyethylene glycol (MIRALAX / GLYCOLAX) packet Take 17 g by mouth daily.  . sucralfate (CARAFATE) 1 G tablet Take 1 tablet (1 g total) by mouth 4 (four) times  daily -  with meals and at bedtime.  . tamsulosin (FLOMAX) 0.4 MG CAPS capsule Take 1 capsule (0.4 mg total) by mouth daily.  Marland Kitchen tetrahydrozoline (VISINE) 0.05 % ophthalmic solution Place 1 drop into both eyes as needed (For dry eyes).  . traMADol (ULTRAM) 50 MG tablet Take 50 mg by mouth every 6 (six) hours as needed for pain.  . vitamin E (VITAMIN E) 400 UNIT capsule Take 400 Units by mouth daily.   No facility-administered encounter medications on file as of 10/07/2014.       Review of Systems  Constitutional: Negative.   HENT: Negative.   Eyes: Negative.   Respiratory: Negative.   Cardiovascular: Negative.   Gastrointestinal: Negative.   Endocrine: Negative.   Genitourinary: Positive for difficulty urinating (slower flow and some minor leakage at night) and genital sores (swelling and redness).  Musculoskeletal: Positive for arthralgias (bilateral shoulder pain and stiffness).  Skin: Negative.        Blister-like area in groin area Lesion - right hand/knuckle  Allergic/Immunologic: Negative.   Neurological: Negative.   Hematological: Negative.   Psychiatric/Behavioral: Negative.        Objective:   Physical Exam  Constitutional: He is oriented to person, place, and time. He appears well-developed and well-nourished. No distress.  HENT:  Head: Normocephalic and atraumatic.  Right Ear: External ear normal.  Left Ear: External ear normal.  Nose: Nose normal.  Mouth/Throat: Oropharynx is clear and moist. No oropharyngeal exudate.  Eyes: Conjunctivae and EOM are normal. Pupils are equal, round, and reactive to light. Right eye exhibits no discharge. Left eye exhibits no discharge. No scleral icterus.  Neck: Normal range of motion. Neck supple. No thyromegaly present.  Cardiovascular: Normal rate and regular rhythm.  Exam reveals friction rub.   No murmur heard. Distal pulses were difficult to palpate At 60/m  Pulmonary/Chest: Effort normal and breath sounds normal. No respiratory distress. He has no wheezes. He has no rales. He exhibits no tenderness.  The patient has some scattered respiratory congestion sounds. No rales.  Abdominal: Soft. Bowel sounds are normal. He exhibits no mass. There is no tenderness. There is no rebound and no guarding.  Genitourinary: Penis normal.  Musculoskeletal: Normal range of motion. He exhibits no edema or tenderness.  The patient has has planned to [place] and the pulses in his feet are difficult to palpate.   Lymphadenopathy:    He has no cervical adenopathy.  Neurological: He is alert and oriented to person, place, and time. He has normal reflexes. No cranial nerve deficit.  Skin: Skin is warm and dry. No rash noted.  Currently the areas on his right hip and right buttock appear to be healing. The foreskin and penis appear to be normal today.  Psychiatric: He has a normal mood and affect. His behavior is normal. Judgment and thought content normal.  Nursing note and vitals reviewed.  BP 103/64 mmHg  Pulse 70  Temp(Src) 96.7 F (35.9 C) (Oral)  Ht $R'5\' 7"'NZ$  (1.702 m)  Wt 177 lb (80.287 kg)  BMI 27.72 kg/m2        Assessment & Plan:  1. Vitamin D deficiency -Continue with current treatment pending results of lab work - CBC with Differential/Platelet - Vit D  25 hydroxy (rtn osteoporosis monitoring)  2. Gastroesophageal reflux disease, esophagitis presence not specified -The patient should continue with his Nexium and PRN use of Carafate and should avoid all medicines that may increase the chance of him having any GI bleeds. -  CBC with Differential/Platelet - Hepatic function panel  3. ASCVD (arteriosclerotic cardiovascular disease) -He is currently having no chest pain and doing well with his heart - CBC with Differential/Platelet - BMP8+EGFR - Lipid panel  4. BPH (benign prostatic hypertrophy) -He is having some problems voiding hesitancy and continues to take Flomax. - CBC with Differential/Platelet  5. Hyperlipemia -He should continue with his aggressive therapeutic lifestyle changes which include diet and exercise pending results of lab work - CBC with Differential/Platelet - BMP8+EGFR - Lipid panel  6. Hypothyroidism, unspecified hypothyroidism type -Continue with current thyroid dose of 50 g pending results of lab work - CBC with Differential/Platelet - Thyroid Panel With TSH  7. H/O: pituitary tumor -Follow up with neurosurgeon as planned - CBC with  Differential/Platelet - Prolactin  Patient Instructions                       Medicare Annual Wellness Visit  [facility] and the medical providers at [facility] strive to bring you the best medical care.  In doing so we not only want to address your current medical conditions and concerns but also to detect new conditions early and prevent illness, disease and health-related problems.    Medicare offers a yearly Wellness Visit which allows our clinical staff to assess your need for preventative services including immunizations, lifestyle education, counseling to decrease risk of preventable diseases and screening for fall risk and other medical concerns.    This visit is provided free of charge (no copay) for all Medicare recipients. The clinical pharmacists at [facility] have begun to conduct these Wellness Visits which will also include a thorough review of all your medications.    As you primary medical provider recommend that you make an appointment for your Annual Wellness Visit if you have not done so already this year.  You may set up this appointment before you leave today or you may call back ([phone]) and schedule an appointment.  Please make sure when you call that you mention that you are scheduling your Annual Wellness Visit with the clinical pharmacist so that the appointment may be made for the proper length of time.     Continue current medications. Continue good therapeutic lifestyle changes which include good diet and exercise. Fall precautions discussed with patient. If an FOBT was given today- please return it to our front desk. If you are over 48 years old - you may need Prevnar 81 or the adult Pneumonia vaccine.   After your visit with [place] today you will receive a survey in the mail or online from [employer] regarding your care with [place]. Please take a moment to fill this out. Your feedback is very important to [place] as you can  help [place] better understand your patient needs as well as improve your experience and satisfaction. WE CARE ABOUT YOU!!!   The patient should continue to use good hygiene with cleaning his buttocks and groin area with good antibacterial soap and water and rinse If the source continue to occur he should get back in touch with [place] and in the meantime he should use his prescription antibiotic ointment for these.   Arrie Senate MD

## 2014-10-07 NOTE — Patient Instructions (Addendum)
Medicare Annual Wellness Visit  [facility] and the medical providers at [facility] strive to bring you the best medical care.  In doing so we not only want to address your current medical conditions and concerns but also to detect new conditions early and prevent illness, disease and health-related problems.    Medicare offers a yearly Wellness Visit which allows our clinical staff to assess your need for preventative services including immunizations, lifestyle education, counseling to decrease risk of preventable diseases and screening for fall risk and other medical concerns.    This visit is provided free of charge (no copay) for all Medicare recipients. The clinical pharmacists at [facility] have begun to conduct these Wellness Visits which will also include a thorough review of all your medications.    As you primary medical provider recommend that you make an appointment for your Annual Wellness Visit if you have not done so already this year.  You may set up this appointment before you leave today or you may call back ([phone]) and schedule an appointment.  Please make sure when you call that you mention that you are scheduling your Annual Wellness Visit with the clinical pharmacist so that the appointment may be made for the proper length of time.     Continue current medications. Continue good therapeutic lifestyle changes which include good diet and exercise. Fall precautions discussed with patient. If an FOBT was given today- please return it to our front desk. If you are over 69 years old - you may need Prevnar 35 or the adult Pneumonia vaccine.   After your visit with [place] today you will receive a survey in the mail or online from [employer] regarding your care with [place]. Please take a moment to fill this out. Your feedback is very important to [place] as you can help [place] better understand your patient needs as well as  improve your experience and satisfaction. WE CARE ABOUT YOU!!!   The patient should continue to use good hygiene with cleaning his buttocks and groin area with good antibacterial soap and water and rinse If the source continue to occur he should get back in touch with [place] and in the meantime he should use his prescription antibiotic ointment for these.

## 2014-10-08 ENCOUNTER — Other Ambulatory Visit: Payer: Self-pay | Admitting: Endocrinology

## 2014-10-08 LAB — BMP8+EGFR
BUN/Creatinine Ratio: 12 (ref 10–22)
BUN: 12 mg/dL (ref 8–27)
CO2: 21 mmol/L (ref 18–29)
Calcium: 8.7 mg/dL (ref 8.6–10.2)
Chloride: 104 mmol/L (ref 97–108)
Creatinine, Ser: 0.97 mg/dL (ref 0.76–1.27)
GFR calc Af Amer: 85 mL/min/{1.73_m2} (ref >59)
GFR calc non Af Amer: 73 mL/min/{1.73_m2} (ref >59)
Glucose: 132 mg/dL — ABNORMAL HIGH (ref 65–99)
Potassium: 3.8 mmol/L (ref 3.5–5.2)
Sodium: 141 mmol/L (ref 134–144)

## 2014-10-08 LAB — CBC WITH DIFFERENTIAL/PLATELET
Basophils Absolute: 0.1 10*3/uL (ref 0.0–0.2)
Basos: 1 %
EOS (ABSOLUTE): 0.2 10*3/uL (ref 0.0–0.4)
Eos: 2 %
Hematocrit: 37.7 % (ref 37.5–51.0)
Hemoglobin: 13 g/dL (ref 12.6–17.7)
Immature Grans (Abs): 0.1 10*3/uL (ref 0.0–0.1)
Immature Granulocytes: 2 %
Lymphocytes Absolute: 1.1 10*3/uL (ref 0.7–3.1)
Lymphs: 14 %
MCH: 32.3 pg (ref 26.6–33.0)
MCHC: 34.5 g/dL (ref 31.5–35.7)
MCV: 94 fL (ref 79–97)
Monocytes Absolute: 1 10*3/uL — ABNORMAL HIGH (ref 0.1–0.9)
Monocytes: 12 %
Neutrophils Absolute: 5.6 10*3/uL (ref 1.4–7.0)
Neutrophils: 69 %
Platelets: 190 10*3/uL (ref 150–379)
RBC: 4.03 x10E6/uL — ABNORMAL LOW (ref 4.14–5.80)
RDW: 14.7 % (ref 12.3–15.4)
WBC: 8.1 10*3/uL (ref 3.4–10.8)

## 2014-10-08 LAB — LIPID PANEL
Chol/HDL Ratio: 3.9 ratio units (ref 0.0–5.0)
Cholesterol, Total: 189 mg/dL (ref 100–199)
HDL: 48 mg/dL (ref >39)
LDL Calculated: 127 mg/dL — ABNORMAL HIGH (ref 0–99)
Triglycerides: 70 mg/dL (ref 0–149)
VLDL Cholesterol Cal: 14 mg/dL (ref 5–40)

## 2014-10-08 LAB — HEPATIC FUNCTION PANEL
ALT: 11 IU/L (ref 0–44)
AST: 17 IU/L (ref 0–40)
Albumin: 3.8 g/dL (ref 3.5–4.7)
Alkaline Phosphatase: 72 IU/L (ref 39–117)
Bilirubin Total: 0.5 mg/dL (ref 0.0–1.2)
Bilirubin, Direct: 0.13 mg/dL (ref 0.00–0.40)
Total Protein: 6.2 g/dL (ref 6.0–8.5)

## 2014-10-08 LAB — THYROID PANEL WITH TSH
Free Thyroxine Index: 2.4 (ref 1.2–4.9)
T3 Uptake Ratio: 32 % (ref 24–39)
T4, Total: 7.4 ug/dL (ref 4.5–12.0)
TSH: 3.57 u[IU]/mL (ref 0.450–4.500)

## 2014-10-08 LAB — PROLACTIN: Prolactin: 119.8 ng/mL — ABNORMAL HIGH (ref 4.0–15.2)

## 2014-10-08 LAB — VITAMIN D 25 HYDROXY (VIT D DEFICIENCY, FRACTURES): Vit D, 25-Hydroxy: 33.7 ng/mL (ref 30.0–100.0)

## 2014-10-13 ENCOUNTER — Other Ambulatory Visit: Payer: Self-pay | Admitting: Internal Medicine

## 2014-10-23 ENCOUNTER — Encounter (HOSPITAL_COMMUNITY)
Admission: RE | Admit: 2014-10-23 | Discharge: 2014-10-23 | Disposition: A | Discharge status: Home / Self Care | Payer: Medicare Other | Source: Ambulatory Visit | Attending: Family Medicine | Admitting: Family Medicine

## 2014-10-23 ENCOUNTER — Encounter (HOSPITAL_COMMUNITY): Payer: Self-pay

## 2014-10-23 DIAGNOSIS — M810 Age-related osteoporosis without current pathological fracture: Secondary | ICD-10-CM | POA: Insufficient documentation

## 2014-10-23 MED ORDER — ZOLEDRONIC ACID 5 MG/100ML IV SOLN
5.0000 mg | Freq: Once | INTRAVENOUS | Status: AC
  Administered 2014-10-23: 5 mg via INTRAVENOUS

## 2014-10-23 MED ORDER — ZOLEDRONIC ACID 5 MG/100ML IV SOLN
INTRAVENOUS | Status: AC
  Filled 2014-10-23: qty 100

## 2014-10-23 MED ORDER — SODIUM CHLORIDE 0.9 % IV SOLN
Freq: Once | INTRAVENOUS | Status: AC
  Administered 2014-10-23: 11:00:00 via INTRAVENOUS

## 2014-10-31 DIAGNOSIS — D1801 Hemangioma of skin and subcutaneous tissue: Secondary | ICD-10-CM | POA: Diagnosis not present

## 2014-10-31 DIAGNOSIS — L821 Other seborrheic keratosis: Secondary | ICD-10-CM | POA: Diagnosis not present

## 2014-10-31 DIAGNOSIS — Z85828 Personal history of other malignant neoplasm of skin: Secondary | ICD-10-CM | POA: Diagnosis not present

## 2014-11-13 ENCOUNTER — Other Ambulatory Visit: Payer: Self-pay | Admitting: Endocrinology

## 2014-12-15 ENCOUNTER — Other Ambulatory Visit: Payer: Self-pay | Admitting: Endocrinology

## 2014-12-19 ENCOUNTER — Ambulatory Visit (INDEPENDENT_AMBULATORY_CARE_PROVIDER_SITE_OTHER): Payer: Medicare Other

## 2014-12-19 DIAGNOSIS — Z23 Encounter for immunization: Secondary | ICD-10-CM

## 2015-01-09 ENCOUNTER — Ambulatory Visit (INDEPENDENT_AMBULATORY_CARE_PROVIDER_SITE_OTHER): Payer: Medicare Other | Admitting: Urology

## 2015-01-09 DIAGNOSIS — N401 Enlarged prostate with lower urinary tract symptoms: Secondary | ICD-10-CM

## 2015-01-09 DIAGNOSIS — N3941 Urge incontinence: Secondary | ICD-10-CM

## 2015-01-09 DIAGNOSIS — R3912 Poor urinary stream: Secondary | ICD-10-CM

## 2015-01-15 ENCOUNTER — Encounter: Payer: Self-pay | Admitting: Cardiology

## 2015-01-15 ENCOUNTER — Other Ambulatory Visit: Payer: Self-pay | Admitting: Endocrinology

## 2015-01-16 NOTE — Telephone Encounter (Signed)
Pt requesting Rx refill but pt hasn't been here since 9/9/1. Pt last Rx refills 12/15/14

## 2015-02-03 ENCOUNTER — Encounter: Payer: Self-pay | Admitting: Family Medicine

## 2015-02-16 ENCOUNTER — Other Ambulatory Visit: Payer: Self-pay | Admitting: Endocrinology

## 2015-02-16 ENCOUNTER — Other Ambulatory Visit: Payer: Self-pay | Admitting: Family Medicine

## 2015-02-16 IMAGING — CR DG CHEST 2V
2 series · 2 of 2 positions shown · non-contrast
Comparison: 09/03/2012

CLINICAL DATA: Leg swelling

CHEST - 2 VIEW

[w chest pa]
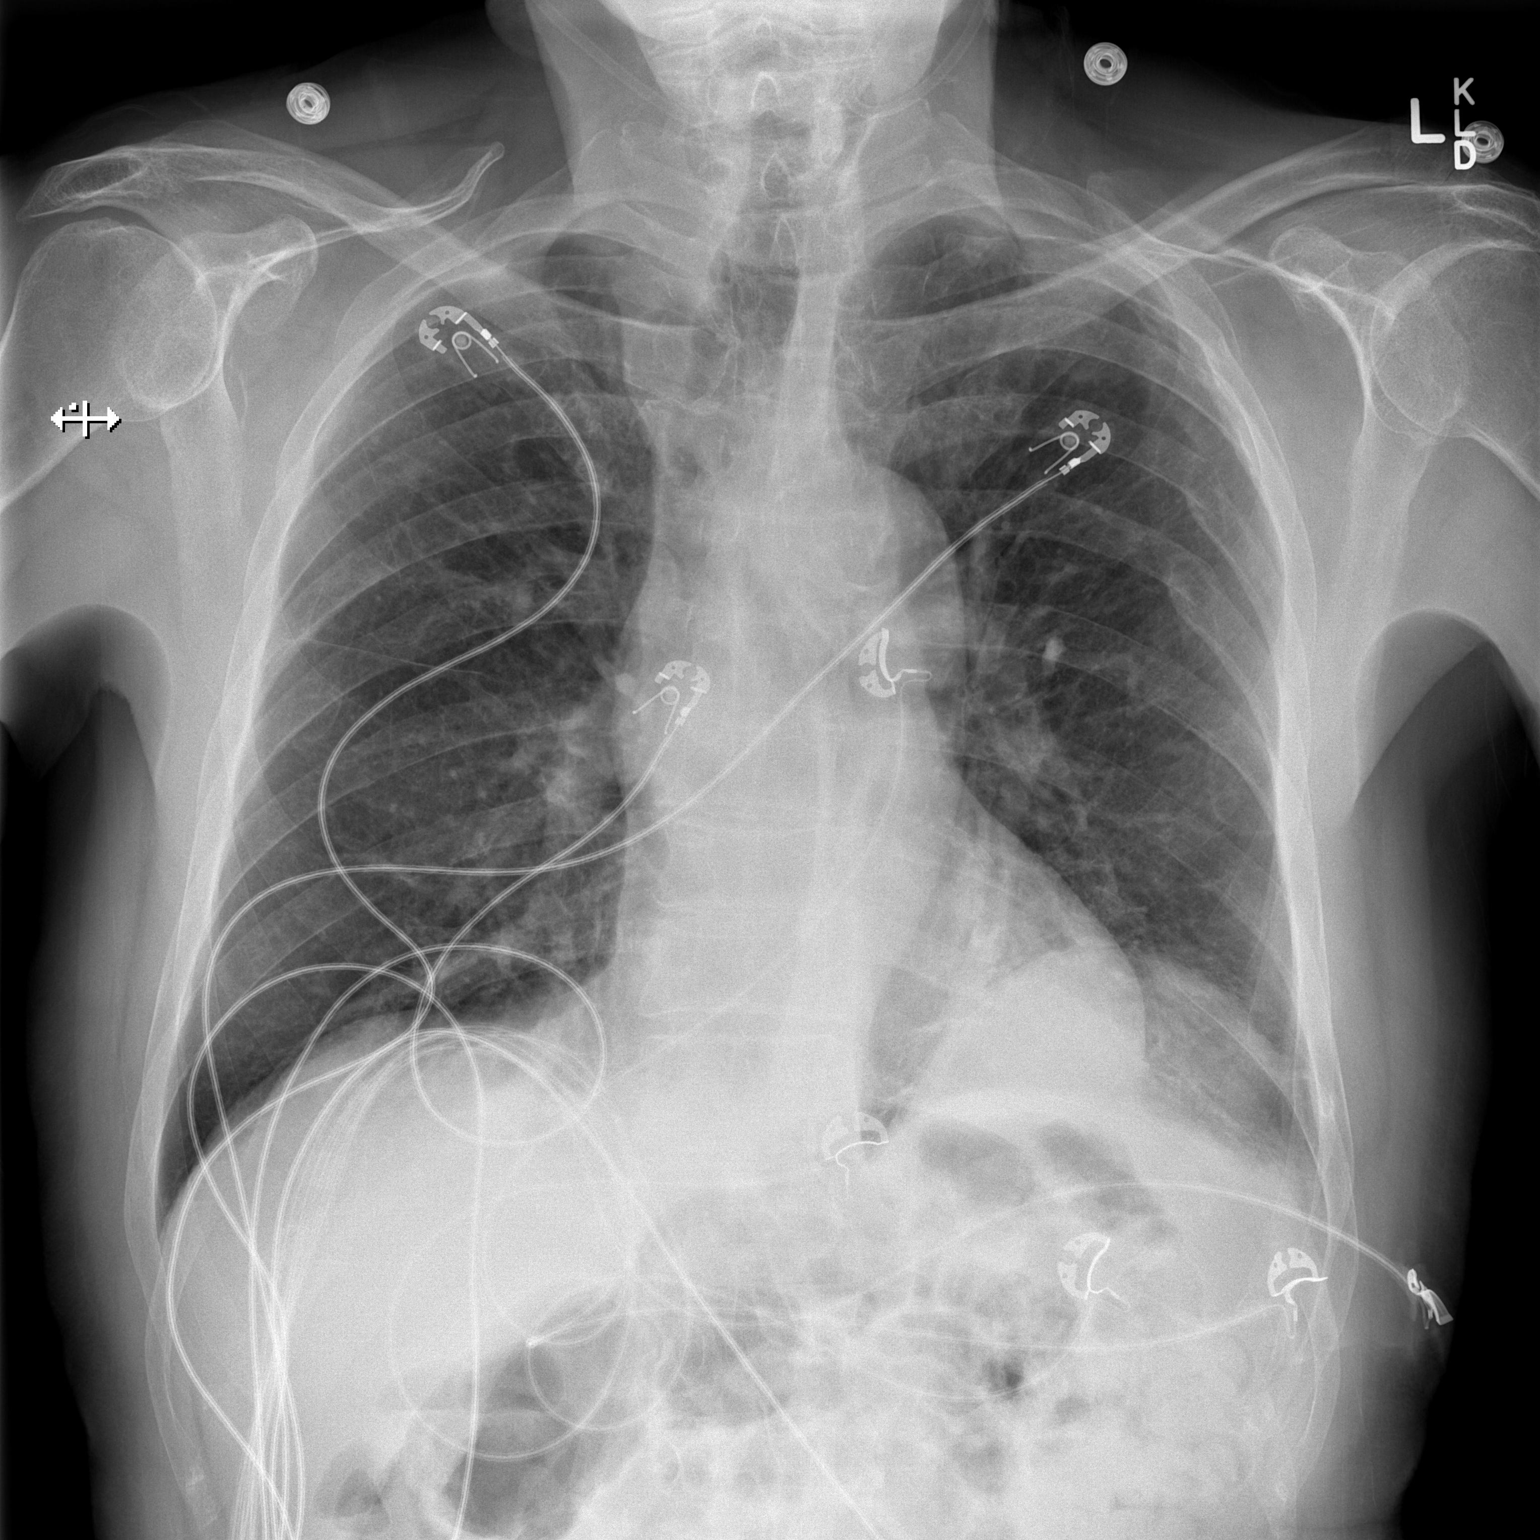

[w chest lat]
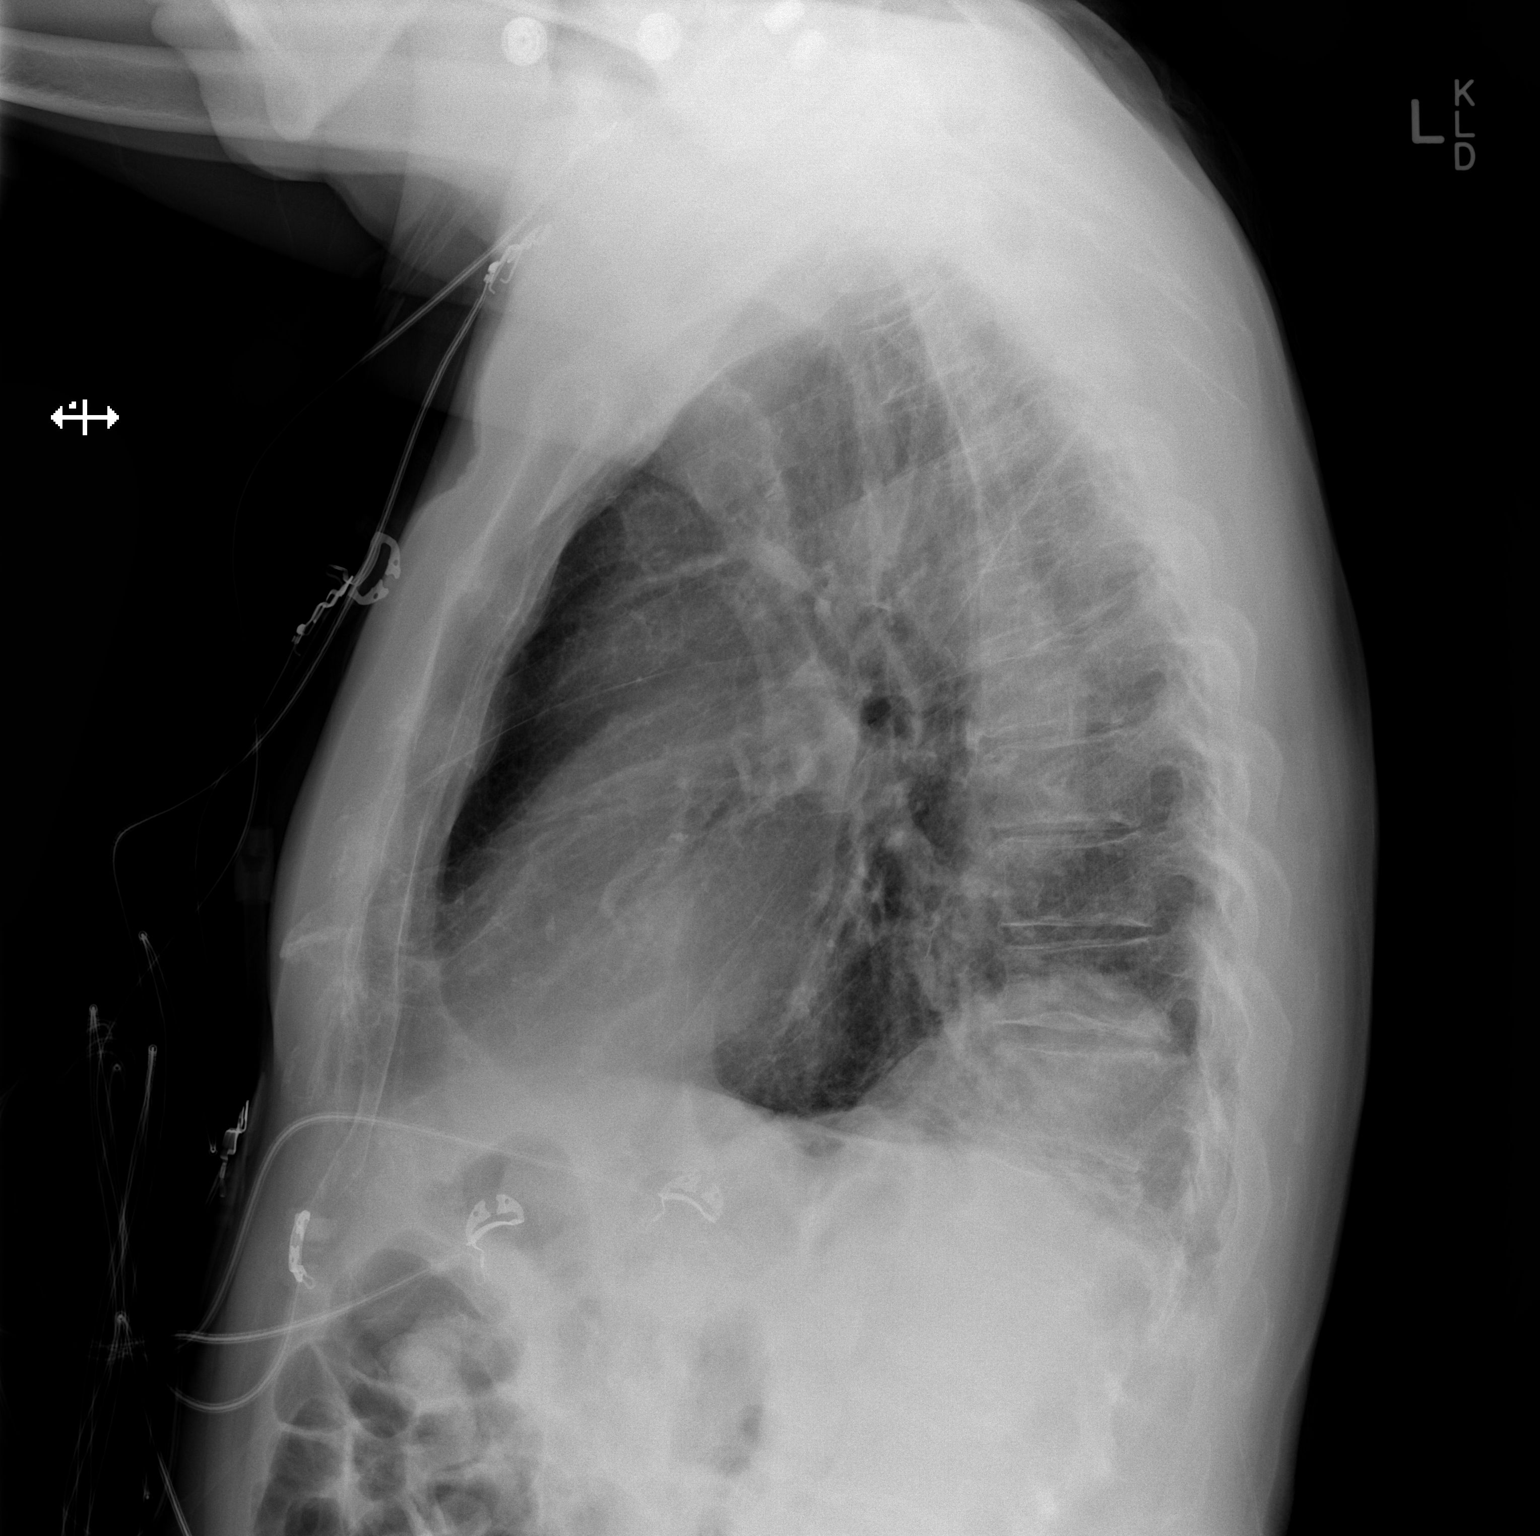

[2 of 2 positions shown; findings below may reference images not displayed]

FINDINGS: Heart size is normal.  There is no pleural effusion or
edema.  There is asymmetric elevation of the left hemidiaphragm.
Coarsened interstitial markings are noted bilaterally and appear
similar to previous exam.  There are chronic left posterior rib
deformities which appears stable from previous exam.
IMPRESSION: 1.  No acute cardiopulmonary abnormalities.
2.  Chronic interstitial coarsening is similar to previous exam.

## 2015-02-24 ENCOUNTER — Ambulatory Visit (INDEPENDENT_AMBULATORY_CARE_PROVIDER_SITE_OTHER): Payer: Medicare Other | Admitting: Family Medicine

## 2015-02-24 ENCOUNTER — Encounter: Payer: Self-pay | Admitting: Family Medicine

## 2015-02-24 VITALS — BP 94/58 | HR 75 | Temp 96.2°F | Ht 67.0 in | Wt 173.0 lb

## 2015-02-24 DIAGNOSIS — E785 Hyperlipidemia, unspecified: Secondary | ICD-10-CM | POA: Diagnosis not present

## 2015-02-24 DIAGNOSIS — K219 Gastro-esophageal reflux disease without esophagitis: Secondary | ICD-10-CM

## 2015-02-24 DIAGNOSIS — E038 Other specified hypothyroidism: Secondary | ICD-10-CM | POA: Diagnosis not present

## 2015-02-24 DIAGNOSIS — G629 Polyneuropathy, unspecified: Secondary | ICD-10-CM | POA: Diagnosis not present

## 2015-02-24 DIAGNOSIS — D353 Benign neoplasm of craniopharyngeal duct: Secondary | ICD-10-CM | POA: Diagnosis not present

## 2015-02-24 DIAGNOSIS — I2510 Atherosclerotic heart disease of native coronary artery without angina pectoris: Secondary | ICD-10-CM | POA: Diagnosis not present

## 2015-02-24 DIAGNOSIS — J449 Chronic obstructive pulmonary disease, unspecified: Secondary | ICD-10-CM

## 2015-02-24 DIAGNOSIS — E039 Hypothyroidism, unspecified: Secondary | ICD-10-CM | POA: Diagnosis not present

## 2015-02-24 DIAGNOSIS — E538 Deficiency of other specified B group vitamins: Secondary | ICD-10-CM

## 2015-02-24 DIAGNOSIS — E559 Vitamin D deficiency, unspecified: Secondary | ICD-10-CM | POA: Diagnosis not present

## 2015-02-24 DIAGNOSIS — D352 Benign neoplasm of pituitary gland: Secondary | ICD-10-CM

## 2015-02-24 DIAGNOSIS — N400 Enlarged prostate without lower urinary tract symptoms: Secondary | ICD-10-CM | POA: Diagnosis not present

## 2015-02-24 DIAGNOSIS — E034 Atrophy of thyroid (acquired): Secondary | ICD-10-CM

## 2015-02-24 NOTE — Progress Notes (Signed)
Subjective:    Patient ID: [name], male    DOB: Apr 09, 1932, 81 y.o.   MRN: [id number]  HPI Pt here for follow up and management of chronic medical problems which includes GERD, hypothyroid and hyperlipidemia. He is taking medications regularly. The patient comes with his wife today to the visit. He complains with a sore on his left heel and some general pruritic issues of his skin. The patient has been using some [place] soap. He actually has a callus on his left heel and he is returning to see the foot specialist who've fits his shoes soon to make some adjustments to the shoe. He denies any chest pain or shortness of breath anymore than usual. He has not seen any blood in the stool. He denies any heartburn or indigestion. He's passing his water without problems.      Patient Active Problem List   Diagnosis Date Noted  . Neck pain, bilateral 12/31/2013  . Neuropathy ([place]) 12/31/2013  . Vitamin B 12 deficiency 12/31/2013  . Abnormality of gait 06/07/2013  . Esophageal ulcer with bleeding 04/20/2013  . S/P left and ritght THA, AA 07/21/2011  . Special screening for malignant neoplasms, colon 01/03/2011  . Personal history of colonic polyps 01/03/2011  . COPD GOLD I 08/24/2010  . GI BLEED 11/17/2009  . ESOPHAGEAL REFLUX 08/25/2009  . BLOOD IN STOOL-MELENA 08/25/2009  . COLONIC POLYPS 07/17/2008  . ANEMIA 07/17/2008  . HYPOTENSION 07/17/2008  . ESOPHAGITIS 07/17/2008  . DIVERTICULOSIS, MILD 07/17/2008  . DEGENERATIVE JOINT DISEASE 07/17/2008  . OSTEOPOROSIS 07/17/2008  . BENIGN PROSTATIC HYPERTROPHY, HX OF 07/17/2008  . HYPERPROLACTINEMIA 10/24/2007  . PITUITARY ADENOMA 06/25/2007  . HYPOTHYROIDISM 06/25/2007  . HYPERLIPIDEMIA 06/22/2007  . Coronary atherosclerosis 06/22/2007   Outpatient Encounter Prescriptions as of 02/24/2015  Medication Sig  . albuterol (PROVENTIL HFA;VENTOLIN HFA) 108 (90 BASE) MCG/ACT inhaler Inhale 2 puffs into the lungs every 6 (six) hours  as needed for wheezing or shortness of breath.  . bromocriptine (PARLODEL) 2.5 MG tablet Take 1 tablet (2.5 mg total) by mouth 2 (two) times daily.  . budesonide-formoterol (SYMBICORT) 160-4.5 MCG/ACT inhaler Inhale 2 puffs into the lungs 2 (two) times daily.  . calcium carbonate (OS-CAL) 600 MG TABS tablet Take 600 mg by mouth 2 (two) times daily with a meal.  . cholecalciferol (VITAMIN D) 1000 UNITS tablet Take 1,000 Units by mouth 2 (two) times daily.  . cyanocobalamin 100 MCG tablet Take 100 mcg by mouth daily.  . ferrous sulfate 325 (65 FE) MG tablet Take 325 mg by mouth daily.   Marland Kitchen guaiFENesin (MUCINEX) 600 MG 12 hr tablet Take 600 mg by mouth 2 (two) times daily.   Marland Kitchen levothyroxine (SYNTHROID, LEVOTHROID) 50 MCG tablet TAKE 1 TABLET IN THE MORNING  . magnesium oxide (MAG-OX) 400 MG tablet Take 400 mg by mouth daily.  . montelukast (SINGULAIR) 10 MG tablet TAKE 1 TABLET DAILY  . nitroGLYCERIN (NITROSTAT) 0.4 MG SL tablet Place 0.4 mg under the tongue every 5 (five) minutes as needed for chest pain.  . pantoprazole (PROTONIX) 40 MG tablet Take 1 tablet (40 mg total) by mouth 2 (two) times daily before a meal .  . polyethylene glycol (MIRALAX / GLYCOLAX) packet Take 17 g by mouth daily.  . sucralfate (CARAFATE) 1 G tablet Take 1 tablet (1 g total) by mouth 4 (four) times daily -  with meals and at bedtime.  . SYMBICORT 160-4.5 MCG/ACT inhaler Inhale 2 puffs into the lungs 2 (  two) times daily.  . tamsulosin (FLOMAX) 0.4 MG CAPS capsule Take 1 capsule (0.4 mg total) by mouth daily.  Marland Kitchen tetrahydrozoline (VISINE) 0.05 % ophthalmic solution Place 1 drop into both eyes as needed (For dry eyes).  . traMADol (ULTRAM) 50 MG tablet Take 50 mg by mouth every 6 (six) hours as needed for pain.  . vitamin E (VITAMIN E) 400 UNIT capsule Take 400 Units by mouth daily.   No facility-administered encounter medications on file as of 02/24/2015.      Review of Systems  Constitutional: Negative.   HENT:  Negative.   Eyes: Negative.   Respiratory: Negative.   Cardiovascular: Negative.   Gastrointestinal: Negative.   Endocrine: Negative.   Genitourinary: Negative.   Musculoskeletal: Negative.   Skin: Negative.        Sore left foot  Skin itches  Allergic/Immunologic: Negative.   Neurological: Negative.   Hematological: Negative.   Psychiatric/Behavioral: Negative.        Objective:   Physical Exam  Constitutional: He is oriented to person, place, and time. He appears well-developed and well-nourished. No distress.  HENT:  Head: Normocephalic and atraumatic.  Right Ear: External ear normal.  Left Ear: External ear normal.  Mouth/Throat: Oropharynx is clear and moist. No oropharyngeal exudate.  Some nasal congestion bilaterally  Eyes: Conjunctivae and EOM are normal. Pupils are equal, round, and reactive to light. Right eye exhibits no discharge. Left eye exhibits no discharge. No scleral icterus.  Neck: Normal range of motion. Neck supple. No thyromegaly present.  No adenopathy  Cardiovascular: Normal rate, regular rhythm and normal heart sounds.   No murmur heard. Pulses were difficult to palpate in the right foot but present in the left foot The heart has a regular rate and rhythm at 72/m  Pulmonary/Chest: Effort normal and breath sounds normal. No respiratory distress. He has no wheezes. He has no rales. He exhibits no tenderness.  Clear anteriorly and posteriorly  Abdominal: Soft. Bowel sounds are normal. He exhibits no mass. There is no tenderness. There is no rebound and no guarding.  Abdomen was soft without masses or tenderness  Genitourinary:  The patient has an appointment with the urologist in January  Musculoskeletal: Normal range of motion. He exhibits no edema or tenderness.  He has bilateral foot deformities and this is the reason for the callus in the left heel. There is no redness or drainage or open skin in this area.  Lymphadenopathy:    He has no cervical  adenopathy.  Neurological: He is alert and oriented to person, place, and time.  Skin: Skin is warm and dry. No rash noted.  Psychiatric: He has a normal mood and affect. His behavior is normal. Judgment and thought content normal.  Nursing note and vitals reviewed.   BP 94/58 mmHg  Pulse 75  Temp(Src) 96.2 F (35.7 C) (Oral)  Ht 5\' 7"  (1.702 m)  Wt 173 lb (78.472 kg)  BMI 27.09 kg/m2      Assessment & Plan:  1. ASCVD (arteriosclerotic cardiovascular disease) -Continue with aggressive therapeutic lifestyle changes which includes diet and exercise as much as tolerated. - BMP8+EGFR; Future - CBC with Differential/Platelet; Future - Hepatic function panel; Future - NMR, lipoprofile; Future  2. Gastroesophageal reflux disease, esophagitis presence not specified -Continue with protonix and sucralfate and avoiding all NSAIDs - CBC with Differential/Platelet; Future - Hepatic function panel; Future  3. Vitamin D deficiency -Continue current treatment pending results of lab work - CBC with Differential/Platelet; Future -  VITAMIN D 25 Hydroxy (Vit-D Deficiency, Fractures); Future  4. BPH (benign prostatic hypertrophy) -Follow-up with Dr. Jeffie Pollock next month as planned - CBC with Differential/Platelet; Future  5. Hyperlipemia -Continue with diet and exercise as much as tolerated - BMP8+EGFR; Future - CBC with Differential/Platelet; Future - Hepatic function panel; Future - NMR, lipoprofile; Future  6. Hypothyroidism, unspecified hypothyroidism type -Continue with current treatment pending results of lab work - CBC with Differential/Platelet; Future  7. Vitamin B 12 deficiency -Continue B12 injections and by mouth B12.  8. PITUITARY ADENOMA -Follow-up with neurosurgery as planned  9. Neuropathy ([place]) -Make shoe adjustment as planned  10. Hypothyroidism due to acquired atrophy of thyroid -Continue current treatment pending results of lab work  67. COPD GOLD I -Continue  follow-up with pulmonology and inhalers as doing  12. BPH (benign prostatic hyperplasia) -Follow-up with urology as planned  Patient Instructions                       Medicare Annual Wellness Visit  [facility] and the medical providers at [facility] strive to bring you the best medical care.  In doing so we not only want to address your current medical conditions and concerns but also to detect new conditions early and prevent illness, disease and health-related problems.    Medicare offers a yearly Wellness Visit which allows our clinical staff to assess your need for preventative services including immunizations, lifestyle education, counseling to decrease risk of preventable diseases and screening for fall risk and other medical concerns.    This visit is provided free of charge (no copay) for all Medicare recipients. The clinical pharmacists at [facility] have begun to conduct these Wellness Visits which will also include a thorough review of all your medications.    As you primary medical provider recommend that you make an appointment for your Annual Wellness Visit if you have not done so already this year.  You may set up this appointment before you leave today or you may call back ([phone]) and schedule an appointment.  Please make sure when you call that you mention that you are scheduling your Annual Wellness Visit with the clinical pharmacist so that the appointment may be made for the proper length of time.     Continue current medications. Continue good therapeutic lifestyle changes which include good diet and exercise. Fall precautions discussed with patient. If an FOBT was given today- please return it to our front desk. If you are over 46 years old - you may need Prevnar 4 or the adult Pneumonia vaccine.  **Flu shots are available--- please call and schedule a FLU-CLINIC appointment**  After your visit with [place] today you will  receive a survey in the mail or online from [employer] regarding your care with [place]. Please take a moment to fill this out. Your feedback is very important to [place] as you can help [place] better understand your patient needs as well as improve your experience and satisfaction. WE CARE ABOUT YOU!!!    please discuss the callus on the left heel with your foot specialist when he returned to see him as planned  Stay as active as possible and be careful not to fall  Follow-up with orthopedic surgeon as planned 4 the shoulder   Arrie Senate MD

## 2015-02-24 NOTE — Patient Instructions (Addendum)
Medicare Annual Wellness Visit  [facility] and the medical providers at [facility] strive to bring you the best medical care.  In doing so we not only want to address your current medical conditions and concerns but also to detect new conditions early and prevent illness, disease and health-related problems.    Medicare offers a yearly Wellness Visit which allows our clinical staff to assess your need for preventative services including immunizations, lifestyle education, counseling to decrease risk of preventable diseases and screening for fall risk and other medical concerns.    This visit is provided free of charge (no copay) for all Medicare recipients. The clinical pharmacists at [facility] have begun to conduct these Wellness Visits which will also include a thorough review of all your medications.    As you primary medical provider recommend that you make an appointment for your Annual Wellness Visit if you have not done so already this year.  You may set up this appointment before you leave today or you may call back [phone] and schedule an appointment.  Please make sure when you call that you mention that you are scheduling your Annual Wellness Visit with the clinical pharmacist so that the appointment may be made for the proper length of time.     Continue current medications. Continue good therapeutic lifestyle changes which include good diet and exercise. Fall precautions discussed with patient. If an FOBT was given today- please return it to our front desk. If you are over 40 years old - you may need Prevnar 32 or the adult Pneumonia vaccine.  **Flu shots are available--- please call and schedule a FLU-CLINIC appointment**  After your visit with [place] today you will receive a survey in the mail or online from [employer] regarding your care with [place]. Please take a moment to fill this out. Your feedback is very  important to [place] as you can help [place] better understand your patient needs as well as improve your experience and satisfaction. WE CARE ABOUT YOU!!!    please discuss the callus on the left heel with your foot specialist when he returned to see him as planned  Stay as active as possible and be careful not to fall  Follow-up with orthopedic surgeon as planned 4 the shoulder

## 2015-02-25 ENCOUNTER — Encounter: Payer: Self-pay | Admitting: Endocrinology

## 2015-02-27 ENCOUNTER — Other Ambulatory Visit: Payer: Self-pay | Admitting: *Deleted

## 2015-02-27 MED ORDER — LEVOTHYROXINE SODIUM 50 MCG PO TABS: 50.0000 ug | ORAL_TABLET | Freq: Every morning | ORAL | Status: DC

## 2015-02-27 NOTE — Telephone Encounter (Signed)
Called pt and advised him per Dr Cordelia Pen note to let pt know that we sent a refill for his levothyroxine to his pharmacy. Pt understood.

## 2015-03-05 ENCOUNTER — Ambulatory Visit (INDEPENDENT_AMBULATORY_CARE_PROVIDER_SITE_OTHER): Payer: Medicare Other | Admitting: Endocrinology

## 2015-03-05 ENCOUNTER — Encounter: Payer: Self-pay | Admitting: Endocrinology

## 2015-03-05 VITALS — BP 120/70 | HR 67 | Temp 97.8°F | Ht 67.0 in | Wt 169.0 lb

## 2015-03-05 DIAGNOSIS — D353 Benign neoplasm of craniopharyngeal duct: Secondary | ICD-10-CM | POA: Diagnosis not present

## 2015-03-05 DIAGNOSIS — E221 Hyperprolactinemia: Secondary | ICD-10-CM | POA: Diagnosis not present

## 2015-03-05 DIAGNOSIS — D352 Benign neoplasm of pituitary gland: Secondary | ICD-10-CM | POA: Diagnosis not present

## 2015-03-05 LAB — BASIC METABOLIC PANEL
BUN: 13 mg/dL (ref 6–23)
CO2: 27 mEq/L (ref 19–32)
Calcium: 9.3 mg/dL (ref 8.4–10.5)
Chloride: 107 mEq/L (ref 96–112)
Creatinine, Ser: 0.93 mg/dL (ref 0.40–1.50)
GFR: 82.99 mL/min (ref >60.00)
Glucose, Bld: 91 mg/dL (ref 70–99)
Potassium: 4.2 mEq/L (ref 3.5–5.1)
Sodium: 140 mEq/L (ref 135–145)

## 2015-03-05 NOTE — Patient Instructions (Signed)
blood tests are requested for you today.  We'll let you know about the results. Let's recheck the MRI.  you will receive a phone call, about a day and time for an appointment. Please continue the same medications. Please return in 1 year.

## 2015-03-05 NOTE — Progress Notes (Signed)
Subjective:    Patient ID: [name], male    DOB: 1932-03-28, 82 y.o.   MRN: [id number]  HPI the status of at least 3 ongoing medical problems is addressed today: pituitary macroadenoma (found incidentally on MRI of the c-spine in 2009; he sees Dr Carloyn Manner for this):  no headache.  He requests to recheck MRI.   hyperprolactinemia: (dx'ed 2009; he was rx'ed parlodel, but dosage has been limited by dizziness) he still has intermittent lightheadedness, so he still often takes just 1/2 of a parlodel qhs   hypothyroidism:  denies weight change.   Past Medical History  Diagnosis Date  . Emphysema   . CAD (coronary artery disease)     left main had  a 20% stenosis.  Left anterior descending had 40-50% proximal   stenosis.  Left circumflex had an 80% proximal stenosis.  The  first obtuse marginal artery had a 60% mid stenosis.  The  right  coronary artery  had  a 50-60% proximal stenosis with a 60-70%  mid stenosis  The patient was managed medically.   Marland Kitchen COPD (chronic obstructive pulmonary disease) ([place])   . Hypothyroidism   . Anemia   . Blood transfusion june 2011, 5 units given    july 2011 some units given  . Enlarged prostate     elevated psa recently  . GERD (gastroesophageal reflux disease)   . Arthritis   . Complication of anesthesia     likes spinal due to copd  . Dizziness     occasional  . Mass, scrotum     L scrotum - Sees Dr. Jeffie Pollock  . Abnormality of gait 06/07/2013  . Pituitary macroadenoma ([place])   . Restless legs syndrome (RLS)   . Asthma   . Cataract     bilateral removal of cateracts  . Depression     Past Surgical History  Procedure Laterality Date  . Coronary stent placement  5 yrs ago  . Hiatal hernia repair  01-04-2008  . Foot surgery  1994 left, 2002 right foot    bilateral foot reconstruciton  . Abdominal hernia repair   2008  . Cataract extraction both eyes    . Total hip arthroplasty  07/21/2011    Procedure: TOTAL HIP ARTHROPLASTY ANTERIOR APPROACH;   Surgeon: Mauri Pole, MD;  Location: [facility];  Service: Orthopedics;  Laterality: Left;  . Coronary angioplasty    . Total hip arthroplasty Right 09/07/2012    Procedure: RIGHT TOTAL HIP ARTHROPLASTY ANTERIOR APPROACH;  Surgeon: Mcarthur Rossetti, MD;  Location: [facility];  Service: Orthopedics;  Laterality: Right;  . Foot surgery      reconstruction of both feet  . Esophagogastroduodenoscopy N/A 04/20/2013    Procedure: ESOPHAGOGASTRODUODENOSCOPY (EGD);  Surgeon: Inda Castle, MD;  Location: [facility];  Service: Endoscopy;  Laterality: N/A;  . Colonoscopy    . Upper gastrointestinal endoscopy      Social History   Social History  . Marital Status: Married    Spouse Name: N/A  . Number of Children: 2  . Years of Education: hs   Occupational History  . retired    Social History Main Topics  . Smoking status: Never Smoker   . Smokeless tobacco: Never Used  . Alcohol Use: No  . Drug Use: No  . Sexual Activity: Not on file   Other Topics Concern  . Not on file   Social History Narrative   Patient drinks caffeine a few times a week.  Patient is right handed.    Current Outpatient Prescriptions on File Prior to Visit  Medication Sig Dispense Refill  . albuterol (PROVENTIL HFA;VENTOLIN HFA) 108 (90 BASE) MCG/ACT inhaler Inhale 2 puffs into the lungs every 6 (six) hours as needed for wheezing or shortness of breath.    . bromocriptine (PARLODEL) 2.5 MG tablet Take 1 tablet (2.5 mg total) by mouth 2 (two) times daily. 60 tablet 6  . calcium carbonate (OS-CAL) 600 MG TABS tablet Take 600 mg by mouth 2 (two) times daily with a meal.    . cholecalciferol (VITAMIN D) 1000 UNITS tablet Take 1,000 Units by mouth 2 (two) times daily.    . cyanocobalamin 100 MCG tablet Take 100 mcg by mouth daily.    . ferrous sulfate 325 (65 FE) MG tablet Take 325 mg by mouth daily.     Marland Kitchen guaiFENesin (MUCINEX) 600 MG 12 hr tablet Take 600 mg by mouth 2 (two) times daily.     Marland Kitchen levothyroxine  (SYNTHROID, LEVOTHROID) 50 MCG tablet Take 1 tablet (50 mcg total) by mouth every morning. 30 tablet 0  . magnesium oxide (MAG-OX) 400 MG tablet Take 400 mg by mouth daily.    . montelukast (SINGULAIR) 10 MG tablet TAKE 1 TABLET DAILY 30 tablet 3  . nitroGLYCERIN (NITROSTAT) 0.4 MG SL tablet Place 0.4 mg under the tongue every 5 (five) minutes as needed for chest pain.    . pantoprazole (PROTONIX) 40 MG tablet Take 1 tablet (40 mg total) by mouth 2 (two) times daily before a meal . 60 tablet 3  . polyethylene glycol (MIRALAX / GLYCOLAX) packet Take 17 g by mouth daily. 14 each 0  . sucralfate (CARAFATE) 1 G tablet Take 1 tablet (1 g total) by mouth 4 (four) times daily -  with meals and at bedtime. 120 tablet 2  . SYMBICORT 160-4.5 MCG/ACT inhaler Inhale 2 puffs into the lungs 2 (two) times daily. 10.2 g 1  . tetrahydrozoline (VISINE) 0.05 % ophthalmic solution Place 1 drop into both eyes as needed (For dry eyes).    . traMADol (ULTRAM) 50 MG tablet Take 50 mg by mouth every 6 (six) hours as needed for pain.    . vitamin E (VITAMIN E) 400 UNIT capsule Take 400 Units by mouth daily.    . tamsulosin (FLOMAX) 0.4 MG CAPS capsule Take 1 capsule (0.4 mg total) by mouth daily. (Patient not taking: Reported on 03/05/2015) 30 capsule 6   No current facility-administered medications on file prior to visit.    Allergies  Allergen Reactions  . Lortab [Hydrocodone-Acetaminophen] Nausea And Vomiting  . Penicillins Other (See Comments)    Light headed  . Zetia [Ezetimibe] Other (See Comments)    Muscle weakness  . Zocor [Simvastatin] Nausea Only and Other (See Comments)    muscle weakness    Family History  Problem Relation Age of Onset  . Heart disease Sister   . Heart failure Father   . Colon cancer Neg Hx   . Esophageal cancer Neg Hx   . Rectal cancer Neg Hx   . Stomach cancer Neg Hx     BP 120/70 mmHg  Pulse 67  Temp(Src) 97.8 F (36.6 C) (Oral)  Ht 5\' 7"  (1.702 m)  Wt 169 lb (76.658  kg)  BMI 26.46 kg/m2  SpO2 94%  Review of Systems Denies LOC and visual loss.      Objective:   Physical Exam VITAL SIGNS:  See vs page GENERAL: no distress  NECK: There is no palpable thyroid enlargement.  No thyroid nodule is palpable.  No palpable lymphadenopathy at the anterior neck. Gait: slow but steady, with a cane.  Lab Results  Component Value Date   TSH 3.570 10/07/2014   T4TOTAL 7.4 10/07/2014   MRI (2016): No significant change  Prolactin=36    Assessment & Plan:  Hyperprolactinemia, therapy limited by dizziness, but he doesn't need any lower testosterone. Pituitary adenoma: given stability over time and his advanced age, the is questionable need for f/u, but he requests Hypothyroidism: well-replaced.   Patient is advised the following: Patient Instructions  blood tests are requested for you today.  We'll let you know about the results. Let's recheck the MRI.  you will receive a phone call, about a day and time for an appointment. Please continue the same medications. Please return in 1 year.   addendum: Please continue the same medications.

## 2015-03-06 LAB — PROLACTIN: Prolactin: 35.9 ng/mL — ABNORMAL HIGH (ref 2.1–17.1)

## 2015-03-10 ENCOUNTER — Telehealth: Payer: Self-pay | Admitting: Endocrinology

## 2015-03-10 ENCOUNTER — Encounter: Payer: Self-pay | Admitting: Endocrinology

## 2015-03-10 NOTE — Telephone Encounter (Signed)
I contacted the pt and advised of note below. Pt verbalized understanding and stated he would cancel the appointment.

## 2015-03-10 NOTE — Telephone Encounter (Signed)
Patient Name: [name] Gender: Male DOB: May 06, 1932 Age: 82 Y 83 M 30 D Return Phone Number: [phone] (Primary) Address: City/State/Zip: [place] Client [facility] Night - Client Client Site [facility] Physician Renato Shin Contact Type Call [place] Name same [place] Phone Number same Relationship To Patient Self Is this call to report lab results? No Call Type General Information Initial Comment caller states he wants to know if he still needs to have a brain scan done since labs look good General Information Type Message Only

## 2015-03-10 NOTE — Telephone Encounter (Signed)
See note below and please advise, Thanks! 

## 2015-03-10 NOTE — Telephone Encounter (Signed)
i think you are ok skipping it. Please cancel if you want to skip it

## 2015-03-13 ENCOUNTER — Inpatient Hospital Stay: Admission: RE | Admit: 2015-03-13 | Payer: Medicare Other | Source: Ambulatory Visit

## 2015-03-27 NOTE — Telephone Encounter (Signed)
error 

## 2015-03-30 ENCOUNTER — Other Ambulatory Visit: Payer: Self-pay | Admitting: Endocrinology

## 2015-03-31 ENCOUNTER — Telehealth: Payer: Self-pay | Admitting: Family Medicine

## 2015-04-01 ENCOUNTER — Other Ambulatory Visit: Payer: Medicare Other

## 2015-04-01 DIAGNOSIS — G320 Subacute combined degeneration of spinal cord in diseases classified elsewhere: Secondary | ICD-10-CM | POA: Diagnosis not present

## 2015-04-01 DIAGNOSIS — M1990 Unspecified osteoarthritis, unspecified site: Secondary | ICD-10-CM

## 2015-04-01 DIAGNOSIS — E538 Deficiency of other specified B group vitamins: Secondary | ICD-10-CM | POA: Diagnosis not present

## 2015-04-01 DIAGNOSIS — N400 Enlarged prostate without lower urinary tract symptoms: Secondary | ICD-10-CM | POA: Diagnosis not present

## 2015-04-02 LAB — BMP8+EGFR
BUN/Creatinine Ratio: 16 (ref 10–22)
BUN: 15 mg/dL (ref 8–27)
CO2: 21 mmol/L (ref 18–29)
Calcium: 8.8 mg/dL (ref 8.6–10.2)
Chloride: 104 mmol/L (ref 96–106)
Creatinine, Ser: 0.94 mg/dL (ref 0.76–1.27)
GFR calc Af Amer: 88 mL/min/{1.73_m2} (ref >59)
GFR calc non Af Amer: 76 mL/min/{1.73_m2} (ref >59)
Glucose: 91 mg/dL (ref 65–99)
Potassium: 4.3 mmol/L (ref 3.5–5.2)
Sodium: 140 mmol/L (ref 134–144)

## 2015-04-02 LAB — NMR, LIPOPROFILE
Cholesterol: 182 mg/dL (ref 100–199)
HDL Cholesterol by NMR: 56 mg/dL (ref >39)
HDL Particle Number: 26.9 umol/L — ABNORMAL LOW (ref >=30.5)
LDL Particle Number: 1203 nmol/L — ABNORMAL HIGH (ref <1000)
LDL Size: 21.6 nm (ref >20.5)
LDL-C: 112 mg/dL — ABNORMAL HIGH (ref 0–99)
LP-IR Score: <25 (ref <=45)
Small LDL Particle Number: 278 nmol/L (ref <=527)
Triglycerides by NMR: 71 mg/dL (ref 0–149)

## 2015-04-02 LAB — CBC WITH DIFFERENTIAL/PLATELET
Basophils Absolute: 0.1 10*3/uL (ref 0.0–0.2)
Basos: 2 %
EOS (ABSOLUTE): 0.1 10*3/uL (ref 0.0–0.4)
Eos: 2 %
Hematocrit: 38.1 % (ref 37.5–51.0)
Hemoglobin: 12.5 g/dL — ABNORMAL LOW (ref 12.6–17.7)
Immature Grans (Abs): 0.1 10*3/uL (ref 0.0–0.1)
Immature Granulocytes: 1 %
Lymphocytes Absolute: 1.1 10*3/uL (ref 0.7–3.1)
Lymphs: 13 %
MCH: 31.3 pg (ref 26.6–33.0)
MCHC: 32.8 g/dL (ref 31.5–35.7)
MCV: 95 fL (ref 79–97)
Monocytes Absolute: 0.9 10*3/uL (ref 0.1–0.9)
Monocytes: 11 %
Neutrophils Absolute: 5.8 10*3/uL (ref 1.4–7.0)
Neutrophils: 71 %
Platelets: 218 10*3/uL (ref 150–379)
RBC: 4 x10E6/uL — ABNORMAL LOW (ref 4.14–5.80)
RDW: 14.9 % (ref 12.3–15.4)
WBC: 8 10*3/uL (ref 3.4–10.8)

## 2015-04-02 LAB — HEPATIC FUNCTION PANEL
ALT: 13 IU/L (ref 0–44)
AST: 28 IU/L (ref 0–40)
Albumin: 4 g/dL (ref 3.5–4.7)
Alkaline Phosphatase: 76 IU/L (ref 39–117)
Bilirubin Total: 0.6 mg/dL (ref 0.0–1.2)
Bilirubin, Direct: 0.16 mg/dL (ref 0.00–0.40)
Total Protein: 6.5 g/dL (ref 6.0–8.5)

## 2015-04-02 LAB — VITAMIN D 25 HYDROXY (VIT D DEFICIENCY, FRACTURES): Vit D, 25-Hydroxy: 35.1 ng/mL (ref 30.0–100.0)

## 2015-04-04 ENCOUNTER — Other Ambulatory Visit: Payer: Self-pay | Admitting: Family Medicine

## 2015-04-21 DIAGNOSIS — Z029 Encounter for administrative examinations, unspecified: Secondary | ICD-10-CM

## 2015-05-01 ENCOUNTER — Ambulatory Visit (INDEPENDENT_AMBULATORY_CARE_PROVIDER_SITE_OTHER): Payer: Medicare Other | Admitting: Urology

## 2015-05-01 DIAGNOSIS — R3912 Poor urinary stream: Secondary | ICD-10-CM | POA: Diagnosis not present

## 2015-05-01 DIAGNOSIS — R970 Elevated carcinoembryonic antigen [CEA]: Secondary | ICD-10-CM | POA: Diagnosis not present

## 2015-05-01 DIAGNOSIS — N3941 Urge incontinence: Secondary | ICD-10-CM | POA: Diagnosis not present

## 2015-05-01 DIAGNOSIS — R351 Nocturia: Secondary | ICD-10-CM | POA: Diagnosis not present

## 2015-05-01 DIAGNOSIS — N401 Enlarged prostate with lower urinary tract symptoms: Secondary | ICD-10-CM

## 2015-05-04 ENCOUNTER — Other Ambulatory Visit: Payer: Self-pay | Admitting: Endocrinology

## 2015-05-04 ENCOUNTER — Telehealth: Payer: Self-pay | Admitting: Internal Medicine

## 2015-05-04 ENCOUNTER — Encounter: Payer: Self-pay | Admitting: Family Medicine

## 2015-05-04 ENCOUNTER — Other Ambulatory Visit: Payer: Self-pay | Admitting: Family Medicine

## 2015-05-04 ENCOUNTER — Other Ambulatory Visit: Payer: Self-pay | Admitting: Internal Medicine

## 2015-05-04 MED ORDER — SUCRALFATE 1 G PO TABS: 1.0000 g | ORAL_TABLET | Freq: Three times a day (TID) | ORAL | Status: DC

## 2015-05-04 NOTE — Telephone Encounter (Signed)
We have not seen patient for a visit since 11/2013 and he has not needed sucralfate refilled in a while so we wanted to confirm that he was still doing okay. Patient states that he is actually doing better than he has in a while as far as his reflux is concerned. He takes pantoprazole in the morning and sucralfate around dinner. I will send refills to the pharmacy. Advised we should probably see him in 6 months or so in office for check up. He verbalizes understanding.

## 2015-05-05 ENCOUNTER — Telehealth: Payer: Self-pay | Admitting: *Deleted

## 2015-05-05 MED ORDER — DOXYCYCLINE HYCLATE 100 MG PO TABS: 100.0000 mg | ORAL_TABLET | Freq: Two times a day (BID) | ORAL | Status: DC

## 2015-05-05 NOTE — Telephone Encounter (Signed)
Dr Jeffie Pollock is requesting clearance for pt to have UROLIFT. They are also asking if aspirin can be held for the procedure. Will forward for dr hochrein's review

## 2015-05-06 NOTE — Telephone Encounter (Signed)
OK to have surgery.  OK to hold ASA.

## 2015-05-07 NOTE — Telephone Encounter (Signed)
Message routed to Dr. Jeffie Pollock thru [facility]

## 2015-05-08 ENCOUNTER — Other Ambulatory Visit: Payer: Self-pay | Admitting: Urology

## 2015-05-12 ENCOUNTER — Other Ambulatory Visit: Payer: Self-pay | Admitting: Urology

## 2015-05-12 ENCOUNTER — Encounter: Payer: Self-pay | Admitting: Family Medicine

## 2015-05-21 ENCOUNTER — Encounter: Payer: Self-pay | Admitting: Family Medicine

## 2015-05-28 ENCOUNTER — Encounter (HOSPITAL_COMMUNITY)
Admission: RE | Admit: 2015-05-28 | Discharge: 2015-05-28 | Disposition: A | Discharge status: Home / Self Care | Payer: Medicare Other | Source: Ambulatory Visit | Attending: Urology | Admitting: Urology

## 2015-05-28 ENCOUNTER — Encounter (HOSPITAL_COMMUNITY): Payer: Self-pay

## 2015-05-28 DIAGNOSIS — N401 Enlarged prostate with lower urinary tract symptoms: Secondary | ICD-10-CM | POA: Diagnosis not present

## 2015-05-28 DIAGNOSIS — N138 Other obstructive and reflux uropathy: Secondary | ICD-10-CM | POA: Diagnosis not present

## 2015-05-28 DIAGNOSIS — Z01812 Encounter for preprocedural laboratory examination: Secondary | ICD-10-CM | POA: Diagnosis not present

## 2015-05-28 HISTORY — DX: Other retention of urine: R33.8

## 2015-05-28 HISTORY — DX: Benign prostatic hyperplasia with lower urinary tract symptoms: N40.1

## 2015-05-28 LAB — CBC
HCT: 39.7 % (ref 39.0–52.0)
Hemoglobin: 13.3 g/dL (ref 13.0–17.0)
MCH: 31.5 pg (ref 26.0–34.0)
MCHC: 33.5 g/dL (ref 30.0–36.0)
MCV: 94.1 fL (ref 78.0–100.0)
Platelets: 218 10*3/uL (ref 150–400)
RBC: 4.22 MIL/uL (ref 4.22–5.81)
RDW: 14.3 % (ref 11.5–15.5)
WBC: 7.5 10*3/uL (ref 4.0–10.5)

## 2015-05-28 LAB — SURGICAL PCR SCREEN
MRSA, PCR: NEGATIVE
Staphylococcus aureus: NEGATIVE

## 2015-05-28 LAB — BASIC METABOLIC PANEL
Anion gap: 9 (ref 5–15)
BUN: 19 mg/dL (ref 6–20)
CO2: 23 mmol/L (ref 22–32)
Calcium: 9.5 mg/dL (ref 8.9–10.3)
Chloride: 108 mmol/L (ref 101–111)
Creatinine, Ser: 0.83 mg/dL (ref 0.61–1.24)
GFR calc Af Amer: >60 mL/min (ref >60)
GFR calc non Af Amer: >60 mL/min (ref >60)
Glucose, Bld: 94 mg/dL (ref 65–99)
Potassium: 4 mmol/L (ref 3.5–5.1)
Sodium: 140 mmol/L (ref 135–145)

## 2015-05-28 NOTE — Patient Instructions (Addendum)
[name]  05/28/2015   Your procedure is scheduled on: 06-01-15  Report to [facility] Main  Entrance take [facility]  elevators to 3rd floor to  [place] at   [place] AM.  Call this number if you have problems the morning of surgery [phone]   Remember: ONLY 1 PERSON MAY GO WITH YOU TO SHORT STAY TO GET  READY MORNING OF [place].  Do not eat food or drink liquids :After Midnight.     Take these medicines the morning of surgery with A SIP OF WATER: Singulair. Levothyroxine. Guaifenesin. (Bromocriptine)Parlodel.Pantoprazole. Use/Bring Inhalers. DO NOT TAKE ANY DIABETIC MEDICATIONS DAY OF YOUR SURGERY                               You may not have any metal on your body including hair pins and              piercings  Do not wear jewelry, make-up, lotions, powders or perfumes, deodorant             Do not wear nail polish.  Do not shave  48 hours prior to surgery.              Men may shave face and neck.   Do not bring valuables to the hospital. [place].  Contacts, dentures or bridgework may not be worn into surgery.  Leave suitcase in the car. After surgery it may be brought to your room.     Patients discharged the day of surgery will not be allowed to drive home.  Name and phone number of your driver:William Malick [place]- son [phone]   Special Instructions: N/A              Please read over the following fact sheets you were given: _____________________________________________________________________             [facility] - Preparing for Surgery Before surgery, you can play an important role.  Because skin is not sterile, your skin needs to be as free of germs as possible.  You can reduce the number of germs on your skin by washing with CHG (chlorahexidine gluconate) soap before surgery.  CHG is an antiseptic cleaner which kills germs and bonds with the skin to continue killing  germs even after washing. Please DO NOT use if you have an allergy to CHG or antibacterial soaps.  If your skin becomes reddened/irritated stop using the CHG and inform your nurse when you arrive at Short Stay. Do not shave (including legs and underarms) for at least 48 hours prior to the first CHG shower.  You may shave your face/neck. Please follow these instructions carefully:  1.  Shower with CHG Soap the night before surgery and the  morning of Surgery.  2.  If you choose to wash your hair, wash your hair first as usual with your  normal  shampoo.  3.  After you shampoo, rinse your hair and body thoroughly to remove the  shampoo.                           4.  Use CHG as you would any other liquid soap.  You  can apply chg directly  to the skin and wash                       Gently with a scrungie or clean washcloth.  5.  Apply the CHG Soap to your body ONLY FROM THE NECK DOWN.   Do not use on face/ open                           Wound or open sores. Avoid contact with eyes, ears mouth and genitals (private parts).                       Wash face,  Genitals (private parts) with your normal soap.             6.  Wash thoroughly, paying special attention to the area where your surgery  will be performed.  7.  Thoroughly rinse your body with warm water from the neck down.  8.  DO NOT shower/wash with your normal soap after using and rinsing off  the CHG Soap.                9.  Pat yourself dry with a clean towel.            10.  Wear clean pajamas.            11.  Place clean sheets on your bed the night of your first shower and do not  sleep with pets. Day of Surgery : Do not apply any lotions/deodorants the morning of surgery.  Please wear clean clothes to the hospital/surgery center.  FAILURE TO FOLLOW THESE INSTRUCTIONS MAY RESULT IN THE CANCELLATION OF YOUR SURGERY PATIENT SIGNATURE_________________________________  NURSE  SIGNATURE__________________________________  ________________________________________________________________________

## 2015-05-28 NOTE — Pre-Procedure Instructions (Signed)
EKG 7'16 Epic.

## 2015-05-31 NOTE — Anesthesia Preprocedure Evaluation (Addendum)
Anesthesia Evaluation  Patient identified by MRN, date of birth, ID band Patient awake    Reviewed: Allergy & Precautions, H&P , NPO status , Patient's Chart, lab work & pertinent test results, reviewed documented beta blocker date and time   Airway Mallampati: II  TM Distance: >3 FB Neck ROM: Full    Dental  (+) Teeth Intact, Dental Advisory Given   Pulmonary asthma , pneumonia, COPD,  COPD inhaler,    breath sounds clear to auscultation       Cardiovascular + CAD   Rhythm:Regular Rate:Normal  CAD, s/p PTCA w/ stent 2008 Currently asymptomatic left main had  a 20% stenosis.  Left anterior descending had 40-50% proximal   stenosis.  Left circumflex had an 80% proximal stenosis.  The  first obtuse marginal artery had a 60% mid stenosis.  The  right  coronary artery  had  a 50-60% proximal stenosis with a 60-70%  mid stenosis  The patient was managed medically.    Neuro/Psych PSYCHIATRIC DISORDERS negative neurological ROS     GI/Hepatic Neg liver ROS, PUD, GERD  Medicated,  Endo/Other  Hypothyroidism Thyroid replacement  Renal/GU negative Renal ROS  negative genitourinary   Musculoskeletal  (+) Arthritis , THR-left   Abdominal   Peds  Hematology negative hematology ROS (+) anemia ,   Anesthesia Other Findings   Reproductive/Obstetrics negative OB ROS                             Anesthesia Physical  Anesthesia Plan  ASA: III  Anesthesia Plan: General   Post-op Pain Management:    Induction: Intravenous  Airway Management Planned: LMA  Additional Equipment:   Intra-op Plan:   Post-operative Plan: Extubation in OR  Informed Consent: I have reviewed the patients History and Physical, chart, labs and discussed the procedure including the risks, benefits and alternatives for the proposed anesthesia with the patient or authorized representative who has indicated his/her understanding  and acceptance.   Dental advisory given  Plan Discussed with: CRNA  Anesthesia Plan Comments:         Anesthesia Quick Evaluation

## 2015-06-01 ENCOUNTER — Ambulatory Visit (HOSPITAL_COMMUNITY)
Admission: RE | Admit: 2015-06-01 | Discharge: 2015-06-01 | Disposition: A | Discharge status: Home / Self Care | Payer: Medicare Other | Source: Ambulatory Visit | Attending: Urology | Admitting: Urology

## 2015-06-01 ENCOUNTER — Ambulatory Visit (HOSPITAL_COMMUNITY): Payer: Medicare Other | Admitting: Anesthesiology

## 2015-06-01 ENCOUNTER — Encounter (HOSPITAL_COMMUNITY)
Admission: RE | Disposition: A | Discharge status: Home / Self Care | Payer: Self-pay | Source: Ambulatory Visit | Attending: Urology

## 2015-06-01 ENCOUNTER — Encounter (HOSPITAL_COMMUNITY): Payer: Self-pay | Admitting: *Deleted

## 2015-06-01 DIAGNOSIS — R3915 Urgency of urination: Secondary | ICD-10-CM | POA: Diagnosis not present

## 2015-06-01 DIAGNOSIS — N3942 Incontinence without sensory awareness: Secondary | ICD-10-CM | POA: Diagnosis not present

## 2015-06-01 DIAGNOSIS — N320 Bladder-neck obstruction: Secondary | ICD-10-CM | POA: Diagnosis not present

## 2015-06-01 DIAGNOSIS — N401 Enlarged prostate with lower urinary tract symptoms: Secondary | ICD-10-CM | POA: Insufficient documentation

## 2015-06-01 DIAGNOSIS — K210 Gastro-esophageal reflux disease with esophagitis: Secondary | ICD-10-CM | POA: Insufficient documentation

## 2015-06-01 DIAGNOSIS — R338 Other retention of urine: Secondary | ICD-10-CM | POA: Insufficient documentation

## 2015-06-01 DIAGNOSIS — M1990 Unspecified osteoarthritis, unspecified site: Secondary | ICD-10-CM | POA: Insufficient documentation

## 2015-06-01 DIAGNOSIS — N138 Other obstructive and reflux uropathy: Secondary | ICD-10-CM | POA: Diagnosis not present

## 2015-06-01 DIAGNOSIS — Z79899 Other long term (current) drug therapy: Secondary | ICD-10-CM | POA: Diagnosis not present

## 2015-06-01 DIAGNOSIS — Z7951 Long term (current) use of inhaled steroids: Secondary | ICD-10-CM | POA: Diagnosis not present

## 2015-06-01 DIAGNOSIS — Z96649 Presence of unspecified artificial hip joint: Secondary | ICD-10-CM | POA: Diagnosis not present

## 2015-06-01 DIAGNOSIS — R3912 Poor urinary stream: Secondary | ICD-10-CM | POA: Diagnosis not present

## 2015-06-01 DIAGNOSIS — J449 Chronic obstructive pulmonary disease, unspecified: Secondary | ICD-10-CM | POA: Diagnosis not present

## 2015-06-01 DIAGNOSIS — R351 Nocturia: Secondary | ICD-10-CM | POA: Diagnosis not present

## 2015-06-01 DIAGNOSIS — D649 Anemia, unspecified: Secondary | ICD-10-CM | POA: Diagnosis not present

## 2015-06-01 DIAGNOSIS — E785 Hyperlipidemia, unspecified: Secondary | ICD-10-CM | POA: Insufficient documentation

## 2015-06-01 DIAGNOSIS — I2510 Atherosclerotic heart disease of native coronary artery without angina pectoris: Secondary | ICD-10-CM | POA: Diagnosis not present

## 2015-06-01 DIAGNOSIS — Z955 Presence of coronary angioplasty implant and graft: Secondary | ICD-10-CM | POA: Insufficient documentation

## 2015-06-01 DIAGNOSIS — J439 Emphysema, unspecified: Secondary | ICD-10-CM | POA: Diagnosis not present

## 2015-06-01 DIAGNOSIS — R350 Frequency of micturition: Secondary | ICD-10-CM | POA: Insufficient documentation

## 2015-06-01 DIAGNOSIS — R32 Unspecified urinary incontinence: Secondary | ICD-10-CM | POA: Diagnosis present

## 2015-06-01 HISTORY — PX: CYSTOSCOPY WITH INSERTION OF UROLIFT: SHX6678

## 2015-06-01 SURGERY — CYSTOSCOPY WITH INSERTION OF UROLIFT
Anesthesia: General

## 2015-06-01 MED ORDER — FENTANYL CITRATE (PF) 100 MCG/2ML IJ SOLN: 25.0000 ug | INTRAMUSCULAR | Status: DC | PRN

## 2015-06-01 MED ORDER — SODIUM CHLORIDE 0.9 % IV SOLN: 250.0000 mL | INTRAVENOUS | Status: DC | PRN

## 2015-06-01 MED ORDER — 0.9 % SODIUM CHLORIDE (POUR BTL) OPTIME
TOPICAL | Status: DC | PRN
  Administered 2015-06-01: 1000 mL

## 2015-06-01 MED ORDER — SODIUM CHLORIDE 0.9% FLUSH: 3.0000 mL | INTRAVENOUS | Status: DC | PRN

## 2015-06-01 MED ORDER — ONDANSETRON HCL 4 MG/2ML IJ SOLN
INTRAMUSCULAR | Status: AC
  Filled 2015-06-01: qty 2

## 2015-06-01 MED ORDER — SODIUM CHLORIDE 0.9 % IR SOLN
Status: DC | PRN
  Administered 2015-06-01: 3000 mL

## 2015-06-01 MED ORDER — LIDOCAINE HCL (CARDIAC) 20 MG/ML IV SOLN
INTRAVENOUS | Status: AC
  Filled 2015-06-01: qty 5

## 2015-06-01 MED ORDER — SODIUM CHLORIDE 0.9% FLUSH: 3.0000 mL | Freq: Two times a day (BID) | INTRAVENOUS | Status: DC

## 2015-06-01 MED ORDER — CIPROFLOXACIN IN D5W 400 MG/200ML IV SOLN
400.0000 mg | Freq: Two times a day (BID) | INTRAVENOUS | Status: DC
  Administered 2015-06-01: 400 mg via INTRAVENOUS

## 2015-06-01 MED ORDER — PROPOFOL 10 MG/ML IV BOLUS
INTRAVENOUS | Status: AC
  Filled 2015-06-01: qty 20

## 2015-06-01 MED ORDER — TRAMADOL-ACETAMINOPHEN 37.5-325 MG PO TABS: 1.0000 | ORAL_TABLET | Freq: Four times a day (QID) | ORAL | Status: DC | PRN

## 2015-06-01 MED ORDER — ONDANSETRON HCL 4 MG/2ML IJ SOLN
INTRAMUSCULAR | Status: DC | PRN
  Administered 2015-06-01: 4 mg via INTRAVENOUS

## 2015-06-01 MED ORDER — LACTATED RINGERS IV SOLN
INTRAVENOUS | Status: DC | PRN
  Administered 2015-06-01: 07:00:00 via INTRAVENOUS

## 2015-06-01 MED ORDER — FENTANYL CITRATE (PF) 100 MCG/2ML IJ SOLN
INTRAMUSCULAR | Status: AC
  Filled 2015-06-01: qty 2

## 2015-06-01 MED ORDER — PROPOFOL 10 MG/ML IV BOLUS
INTRAVENOUS | Status: DC | PRN
  Administered 2015-06-01: 20 mg via INTRAVENOUS
  Administered 2015-06-01: 30 mg via INTRAVENOUS

## 2015-06-01 MED ORDER — LACTATED RINGERS IV SOLN: INTRAVENOUS | Status: DC

## 2015-06-01 MED ORDER — STERILE WATER FOR IRRIGATION IR SOLN
Status: DC | PRN
  Administered 2015-06-01: 30 mL

## 2015-06-01 MED ORDER — CIPROFLOXACIN IN D5W 400 MG/200ML IV SOLN
INTRAVENOUS | Status: AC
  Filled 2015-06-01: qty 200

## 2015-06-01 MED ORDER — ACETAMINOPHEN 650 MG RE SUPP
650.0000 mg | RECTAL | Status: DC | PRN
  Filled 2015-06-01: qty 1

## 2015-06-01 MED ORDER — MEPERIDINE HCL 50 MG/ML IJ SOLN: 6.2500 mg | INTRAMUSCULAR | Status: DC | PRN

## 2015-06-01 MED ORDER — DEXAMETHASONE SODIUM PHOSPHATE 10 MG/ML IJ SOLN
INTRAMUSCULAR | Status: AC
  Filled 2015-06-01: qty 1

## 2015-06-01 MED ORDER — TRAMADOL-ACETAMINOPHEN 37.5-325 MG PO TABS
1.0000 | ORAL_TABLET | Freq: Four times a day (QID) | ORAL | Status: DC | PRN
  Administered 2015-06-01: 1 via ORAL
  Filled 2015-06-01: qty 1

## 2015-06-01 MED ORDER — CIPROFLOXACIN HCL 250 MG PO TABS: 250.0000 mg | ORAL_TABLET | Freq: Two times a day (BID) | ORAL | Status: DC

## 2015-06-01 MED ORDER — BUPIVACAINE IN DEXTROSE 0.75-8.25 % IT SOLN
INTRATHECAL | Status: DC | PRN
  Administered 2015-06-01: 1.5 mL via INTRATHECAL

## 2015-06-01 MED ORDER — ACETAMINOPHEN 325 MG PO TABS: 650.0000 mg | ORAL_TABLET | ORAL | Status: DC | PRN

## 2015-06-01 SURGICAL SUPPLY — 10 items
BAG URO CATCHER STRL LF (MISCELLANEOUS) ×2 IMPLANT
CATH FOLEY 2WAY SLVR  5CC 16FR (CATHETERS) ×1
CATH FOLEY 2WAY SLVR 5CC 16FR (CATHETERS) ×1 IMPLANT
CLOTH BEACON ORANGE TIMEOUT ST (SAFETY) ×2 IMPLANT
GLOVE SURG SS PI 8.0 STRL IVOR (GLOVE) ×2 IMPLANT
GOWN STRL REUS W/TWL XL LVL3 (GOWN DISPOSABLE) ×2 IMPLANT
MANIFOLD NEPTUNE II (INSTRUMENTS) ×2 IMPLANT
PACK CYSTO (CUSTOM PROCEDURE TRAY) ×2 IMPLANT
SYSTEM UROLIFT (Male Continence) ×8 IMPLANT
TUBING CONNECTING 10 (TUBING) ×2 IMPLANT

## 2015-06-01 NOTE — Brief Op Note (Signed)
06/01/2015  8:29 AM  PATIENT:  [name]  82 y.o. male  PRE-OPERATIVE DIAGNOSIS:  BPH with Bladder Outlet Obstruction  POST-OPERATIVE DIAGNOSIS:  BPH with Bladder Outlet Obstruction  PROCEDURE:  Procedure(s): CYSTOSCOPY WITH INSERTION OF UROLIFT (N/A) 4 devices  SURGEON:  Surgeon(s) and Role:    * Irine Seal, MD - Primary  PHYSICIAN ASSISTANT:   ASSISTANTS: none   ANESTHESIA:   spinal  EBL:  Total I/O In: 400 [I.V.:400] Out: 10 [Blood:10]  BLOOD ADMINISTERED:none  DRAINS: Urinary Catheter (Foley)   LOCAL MEDICATIONS USED:  NONE  SPECIMEN:  No Specimen  DISPOSITION OF SPECIMEN:  N/A  COUNTS:  YES  TOURNIQUET:  * No tourniquets in log *  DICTATION: .Other Dictation: Dictation Number [id number]  PLAN OF CARE: Discharge to home after PACU  PATIENT DISPOSITION:  PACU - hemodynamically stable.   Delay start of Pharmacological VTE agent (>24hrs) due to surgical blood loss or risk of bleeding: not applicable

## 2015-06-01 NOTE — Discharge Instructions (Addendum)
CYSTOSCOPY HOME CARE INSTRUCTIONS  Activity: Rest for the remainder of the day.  Do not drive or operate equipment today.  Increase activity as tolerated for the next week . Only resume normal activities after one week if tolerated, based on pain. Let pain be your guide. If something hurts Benedetto't continue the activity.  Meals: Drink plenty of liquids and eat light foods such as gelatin or soup this evening.  You may return to a normal meal plan tomorrow.  Return to Work: You may return to work in one to two days or as instructed by your physician.  Special Instructions / Symptoms: Call your physician if any of these symptoms occur:   -persistent or heavy bleeding  -bleeding which continues after first few urination  -large blood clots that are difficult to pass  -urine stream diminishes or stops completely  -fever equal to or higher than 101 degrees Farenheit.  -cloudy urine with a strong, foul odor  -severe pain  Females should always wipe from front to back after elimination.  You may feel some burning pain when you urinate.  This should disappear with time.  Applying moist heat to the lower abdomen or a hot tub bath may help relieve the pain. \  Avoid lifting more than about 10 lbs for the next week.    Patient Signature:  ________________________________________________________  Nurse's Signature:  ________________________________________________________    General Anesthesia, Adult, Care After Refer to this sheet in the next few weeks. These instructions provide you with information on caring for yourself after your procedure. Your health care provider may also give you more specific instructions. Your treatment has been planned according to current medical practices, but problems sometimes occur. Call your health care provider if you have any problems or questions after your procedure. WHAT TO EXPECT AFTER THE PROCEDURE After the procedure, it is typical to  experience:  Sleepiness.  Nausea and vomiting. HOME CARE INSTRUCTIONS  For the first 24 hours after general anesthesia:  Have a responsible person with you.  Do not drive a car. If you are alone, do not take public transportation.  Do not drink alcohol.  Do not take medicine that has not been prescribed by your health care provider.  Do not sign important papers or make important decisions.  You may resume a normal diet and activities as directed by your health care provider.  Change bandages (dressings) as directed.  If you have questions or problems that seem related to general anesthesia, call the hospital and ask for the anesthetist or anesthesiologist on call. SEEK MEDICAL CARE IF:  You have nausea and vomiting that continue the day after anesthesia.  You develop a rash. SEEK IMMEDIATE MEDICAL CARE IF:   You have difficulty breathing.  You have chest pain.  You have any allergic problems.   This information is not intended to replace advice given to you by your health care provider. Make sure you discuss any questions you have with your health care provider.   Document Released: 05/23/2000 Document Revised: 03/07/2014 Document Reviewed: 06/15/2011 Elsevier Interactive Patient Education [employer].

## 2015-06-01 NOTE — H&P (Signed)
Chief Complaint  I have some dampness   Active Problems  1. Benign prostatic hyperplasia with urinary obstruction (N40.1,N13.8)  2. Elevated prostate specific antigen (PSA) (R97.20)  3. History of epididymitis (Z87.438)  4. Intermittent urinary stream (R39.13)  5. Nocturia (R35.1)  6. Urge incontinence of urine (N39.41)  7. Weak urinary stream (R39.12)  History of Present Illness   [name] returns today in f/u.  He has some urge urinary leakage and has some frequency.   He can have some insensible incontinence.  He has nocturia 2-3x usually but he has the rare night with 1x.  He has no dysuria or hematuria.  He was switched from tamsulosin to alfuzosin at his last visit but didn't notice any improvement with that change.  He has a history of  BPH with prior retention.  He has a reduced stream with intermittency.  He feels like he does empty.  He is on bromocriptine now for his pituitary tumor.  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   Past Medical History  1. History of Arthritis  2. History of Benign prostatic hypertrophy without lower urinary tract symptoms (N40.0)  3. History of Chronic obstructive pulmonary disease (J44.9)  4. History of Coronary Artery Disease  5. History of Diverticulosis (K57.90)  6. History of Emphysema  7. History of anemia (Z86.2)  8. History of epididymitis (Z87.438)  9. History of esophageal reflux (Z87.19)  10. History of esophagitis (Z87.19)  11. History of gastrointestinal hemorrhage (Z87.19)  12. History of hyperlipidemia (Z86.39)  13. History of hyperprolactinemia (Z86.39)  14. History of hypotension (Z86.79)  15. History of peptic ulcer (Z87.11)  16. History of Osteoporosis (M81.0)  17. History of Urinary retention (R33.9)  Surgical History  1. History of Cath Stent Placement  2. History of Foot Surgery Right  3. History of Hernia Repair  4. History of Hernia Repair  5. History of Total Hip Replacement  Current  Meds  1. Albuterol Sulfate NEBU;  Therapy: (Recorded:17Jul2014) to Recorded  2. Alfuzosin HCl ER 10 MG Oral Tablet Extended Release 24 Hour; TAKE 1 TABLET EACH  DAY;  Therapy: [id number] to (Last Rx:11Nov2016)  Requested for: [id number] Ordered  3. Bromocriptine Mesylate 2.5 MG Oral Tablet;  Therapy: (Recorded:17Jul2014) to Recorded  4. Calcarb 600 TABS;  Therapy: (Recorded:11Nov2016) to Recorded  5. Carafate 1 GM Oral Tablet;  Therapy: (Recorded:11Nov2016) to Recorded  6. Daily Multiple Vitamins TABS;  Therapy: (Recorded:17Jul2014) to Recorded  7. Ferrous Sulfate 325 (65 Fe) MG Oral Tablet;  Therapy: (Recorded:17Jul2014) to Recorded  8. GuaiFENesin TABS;  Therapy: (Recorded:17Jul2014) to Recorded  9. Hyoscyamine Sulfate 0.125 MG Oral Tablet Dispersible;  Therapy: (Recorded:17Jul2014) to Recorded  10. Levothyroxine Sodium 50 MCG Oral Tablet;   Therapy: (Recorded:17Jul2014) to Recorded  11. Mag-Ox 400 TABS;   Therapy: (Recorded:11Nov2016) to Recorded  12. Methocarbamol 500 MG Oral Tablet;   Therapy: (Recorded:17Jul2014) to Recorded  13. MiraLax Oral Packet;   Therapy: (Recorded:11Nov2016) to Recorded  14. Montelukast Sodium 10 MG Oral Tablet;   Therapy: (Recorded:17Jul2014) to Recorded  15. Nitroglycerin 0.4 MG/HR Transdermal Patch 24 Hour;   Therapy: (Recorded:17Jul2014) to Recorded  16. Pantoprazole Sodium 40 MG Oral Tablet Delayed Release;   Therapy: (Recorded:17Jul2014) to Recorded  17. Pravastatin Sodium 40 MG Oral Tablet;   Therapy: (Recorded:17Jul2014) to Recorded  18. Symbicort 160-4.5 MCG/ACT Inhalation Aerosol;   Therapy: (Recorded:17Jul2014) to Recorded  19. TraMADol HCl - 50 MG Oral Tablet;   Therapy: (Recorded:17Jul2014) to Recorded  20. Visine 0.05 % Ophthalmic Solution;   Therapy: (Recorded:11Nov2016) to Recorded  21. Vitamin D TABS;   Therapy: (Recorded:17Jul2014) to Recorded  22. Vitamin E 100 UNIT Oral Capsule;   Therapy: (Recorded:11Nov2016) to  Recorded  Allergies  1. Lortab TABS  2. Penicillins  3. Zocor TABS  4. Zetia TABS  Family History  1. Family history of Death In The Family Father  2. Family history of Death In The Family Mother  3. Family history of Family Health Status Number Of Children  Social History  1. Denied: History of Alcohol Use (History)  2. Denied: History of Caffeine Use  3. Marital History - Currently Married  4. Never smoker  5. Occupation: Retired  24. Denied: History of Tobacco Use   Past and social history reviewed and updated.   Review of Systems  Genitourinary: feelings of urinary urgency, nocturia and incontinence.  Gastrointestinal: no diarrhea and no constipation.  Constitutional: no recent weight loss.  Cardiovascular: no leg swelling.  Neurological: no dizziness.    Vitals Vital Signs [Data Includes: Last 1 Day]  Recorded: [id number] 02:57PM  Blood Pressure: 108 / 66 Temperature: 97.4 F Heart Rate: 61  Results/Data  Flow Rate: Voided 32 ml. A peak flow rate of 62ml/s and mean flow rate of 13ml/s. Selected Results  UA With REFLEX [id number] 03:15PM [name]  [id number]   Test Name Result Flag Reference  COLOR YELLOW  YELLOW  ** PLEASE NOTE CHANGE IN UNIT OF MEASURE AND REFERENCE RANGE(S). **  APPEARANCE CLEAR  CLEAR  SPECIFIC GRAVITY 1.020  1.001-1.035  pH 5.5  5.0-8.0  GLUCOSE NEGATIVE  NEGATIVE  BILIRUBIN NEGATIVE  NEGATIVE  KETONE NEGATIVE  NEGATIVE  BLOOD NEGATIVE  NEGATIVE  PROTEIN NEGATIVE  NEGATIVE  NITRITE NEGATIVE  NEGATIVE  LEUKOCYTE ESTERASE NEGATIVE  NEGATIVE   Procedure  Procedure: Cystoscopy   Indication: Lower Urinary Tract Symptoms.  Informed Consent: Risks, benefits, and potential adverse events were discussed and informed consent was obtained from the patient.  Prep: The patient was prepped with betadine.  Procedure Note:  Urethral meatus:. No abnormalities.  Anterior urethra: No abnormalities.  Prostatic urethra: No abnormalities .  Estimated length was 2 cm. There was visual obstruction of the prostatic urethra. The lateral prostatic lobes were enlarged.  Bladder: Visulization was clear. The ureteral orifices were in the normal anatomic position bilaterally and had clear efflux of urine. A systematic survey of the bladder demonstrated no bladder tumors or stones. The mucosa was smooth without abnormalities. Examination of the bladder demonstrated mild trabeculation. The patient tolerated the procedure well.  Complications: None.    Assessment  1. Benign prostatic hyperplasia with urinary obstruction (N40.1,N13.8)  2. Urge incontinence of urine (N39.41)  3. Weak urinary stream (R39.12)  4. Nocturia (R35.1)   He has BPH with BOO with a very weak stream and UUI.  He has failed medical therapy and needs surgical intervention.   Plan Elevated prostate specific antigen (PSA)   1. UA With REFLEX; [Do Not Release]; Status:Hold For - [employer];  Requested [id number];

## 2015-06-01 NOTE — Anesthesia Procedure Notes (Signed)
Spinal Patient location during procedure: OR Staffing Anesthesiologist: ,  Performed by: anesthesiologist  Preanesthetic Checklist Completed: patient identified, site marked, surgical consent, pre-op evaluation, timeout performed, IV checked, risks and benefits discussed and monitors and equipment checked Spinal Block Patient position: sitting Prep: ChloraPrep Patient monitoring: heart rate, continuous pulse ox and blood pressure Approach: right paramedian Location: L4-5 Injection technique: single-shot Needle Needle type: Spinocan  Needle gauge: 22 G Needle length: 9 cm Additional Notes Expiration date of kit checked and confirmed. Patient tolerated procedure well, without complications.     

## 2015-06-01 NOTE — Transfer of Care (Signed)
Immediate Anesthesia Transfer of Care Note  Patient: [name]  Procedure(s) Performed: Procedure(s): CYSTOSCOPY WITH INSERTION OF UROLIFT (N/A)  Patient Location: PACU  Anesthesia Type:Regional  Level of Consciousness: awake, alert , oriented and patient cooperative  Airway & Oxygen Therapy: Patient Spontanous Breathing  Post-op Assessment: Report given to RN and Post -op Vital signs reviewed and stable  Post vital signs: Reviewed and stable  Last Vitals:  Filed Vitals:   06/01/15 0545  BP: 129/64  Pulse: 62  Temp: 36.4 C  Resp: 16    Complications: No apparent anesthesia complications

## 2015-06-01 NOTE — Anesthesia Postprocedure Evaluation (Signed)
Anesthesia Post Note  Patient: [name]  Procedure(s) Performed: Procedure(s) (LRB): CYSTOSCOPY WITH INSERTION OF UROLIFT x4 (N/A)  Patient location during evaluation: PACU Anesthesia Type: Spinal and MAC Level of consciousness: awake and alert Pain management: pain level controlled Vital Signs Assessment: post-procedure vital signs reviewed and stable Respiratory status: spontaneous breathing and respiratory function stable Cardiovascular status: blood pressure returned to baseline and stable Postop Assessment: spinal receding Anesthetic complications: no    Last Vitals:  Filed Vitals:   06/01/15 1259 06/01/15 1404  BP: 123/61 143/77  Pulse:  68  Temp:  36.3 C  Resp:  16    Last Pain:  Filed Vitals:   06/01/15 1405  PainSc: [place]

## 2015-06-01 NOTE — Progress Notes (Signed)
Pt Not able to void after several attempts. Bladder scan done 558ml. Notified DR Jeffie Pollock, received orders to d/c home with foley cath & return to office tomorrow for foley removal.

## 2015-06-02 ENCOUNTER — Ambulatory Visit (INDEPENDENT_AMBULATORY_CARE_PROVIDER_SITE_OTHER): Payer: Medicare Other | Admitting: Urology

## 2015-06-02 DIAGNOSIS — N401 Enlarged prostate with lower urinary tract symptoms: Secondary | ICD-10-CM

## 2015-06-02 DIAGNOSIS — N3941 Urge incontinence: Secondary | ICD-10-CM

## 2015-06-02 DIAGNOSIS — R351 Nocturia: Secondary | ICD-10-CM

## 2015-06-02 DIAGNOSIS — R3912 Poor urinary stream: Secondary | ICD-10-CM

## 2015-06-02 NOTE — Op Note (Deleted)
[name], [name]               ACCOUNT NO.:  [id number]  MEDICAL RECORD NO.:  [id number]  LOCATION:                                 FACILITY:  PHYSICIAN:  Marshall Cork. Jeffie Pollock, M.D.    DATE OF BIRTH:  [birth year]  DATE OF PROCEDURE:  06/01/2015 DATE OF DISCHARGE:  05/28/2015                              OPERATIVE REPORT   PROCEDURE:  Cystoscopy with implantation of UroLift devices #4.  PREOPERATIVE DIAGNOSIS:  Benign prostatic hypertrophy with bladder outlet obstruction.  POSTOPERATIVE DIAGNOSIS:  Benign prostatic hypertrophy with bladder outlet obstruction.  SURGEON:  Marshall Cork. Jeffie Pollock, M.D.  ANESTHESIA:  General.  SPECIMEN:  None.  DRAINS:  A 16-French Foley catheter.  BLOOD LOSS:  Minimal.  COMPLICATIONS:  None.  INDICATIONS:  [name] is an 82 year old white male with BPH and bladder outlet obstruction.  He has failed medical therapy and has elected UroLift for definitive treatment.  FINDINGS OF PROCEDURE:  He was taken to the operating room where a spinal anesthetic was induced.  He was given Cipro.  He was placed in lithotomy position and was fitted with PAS hose.  His perineum and genitalia were prepped with chlorhexidine, and he was draped in usual sterile fashion.  Cystoscopy was performed using the UroLift scope and 0 degree lens. Examination revealed a normal urethra.  The external sphincter was intact.  Prostatic urethra was approximately 2 cm in length with lateral lobe enlargement. There was also little bit of bladder neck elevation. Inspection of bladder revealed mild-to-moderate trabeculation with no tumors, stones, or inflammation.  No cellules or diverticula were noted. Ureteral orifices were in their normal anatomic position effluxing clear urine.  After initial cystoscopy, the visual obturator was replaced with the first UroLift device.  This was turned to the 9 o'clock position and pulled back to the veru and then slightly advanced.  Pressure  was then applied to the right lateral lobe and the UroLift device was deployed.  The second UroLift device was then inserted and applied to the left lateral lobe at 3 o'clock and deployed in the mid prostatic urethra. After this, there was still some apparent obstruction closer to the bladder neck.  So a second level of UroLift your left device was applied between the mid urethra and the proximal urethra providing further patency to the prostatic urethra.  At this point, there was mild bleeding but the patient did have a spinal anesthetic.  So it was thought that a Foley catheter was indicated.  The scope was removed and a 16-French Foley catheter was inserted without difficulty.  The balloon was filled with 10 mL sterile fluid, and the catheter was placed to straight drainage.  The patient was moved to recovery room in stable condition.  The catheter will be maintained until his spinal anesthetic is resolved, and he will be discharged home after a voiding trial.     Marshall Cork. Jeffie Pollock, M.D.     JJW/MEDQ  D:  06/01/2015  T:  06/02/2015  Job:  [id number]

## 2015-06-02 NOTE — Op Note (Signed)
NAMECASE, LAMMERS               ACCOUNT NO.:  [id number]  MEDICAL RECORD NO.:  [id number]  LOCATION:                                 FACILITY:  PHYSICIAN:  Marshall Cork. Jeffie Pollock, M.D.    DATE OF BIRTH:  03-26-1932  DATE OF PROCEDURE:  06/01/2015 DATE OF DISCHARGE:  05/28/2015                              OPERATIVE REPORT   PROCEDURE:  Cystoscopy with implantation of UroLift devices #4.  PREOPERATIVE DIAGNOSIS:  Benign prostatic hypertrophy with bladder outlet obstruction.  POSTOPERATIVE DIAGNOSIS:  Benign prostatic hypertrophy with bladder outlet obstruction.  SURGEON:  Marshall Cork. Jeffie Pollock, M.D.  ANESTHESIA:  General.  SPECIMEN:  None.  DRAINS:  A 16-French Foley catheter.  BLOOD LOSS:  Minimal.  COMPLICATIONS:  None.  INDICATIONS:  [name] is an 82 year old white male with BPH and bladder outlet obstruction.  He has failed medical therapy and has elected UroLift for definitive treatment.  FINDINGS OF PROCEDURE:  He was taken to the operating room where a spinal anesthetic was induced.  He was given Cipro.  He was placed in lithotomy position and was fitted with PAS hose.  His perineum and genitalia were prepped with chlorhexidine, and he was draped in usual sterile fashion.  Cystoscopy was performed using the UroLift scope and 0 degree lens. Examination revealed a normal urethra.  The external sphincter was intact.  Prostatic urethra was approximately 2 cm in length with lateral lobe enlargement. There was also little bit of bladder neck elevation. Inspection of bladder revealed mild-to-moderate trabeculation with no tumors, stones, or inflammation.  No cellules or diverticula were noted. Ureteral orifices were in their normal anatomic position effluxing clear urine.  After initial cystoscopy, the visual obturator was replaced with the first UroLift device.  This was turned to the 9 o'clock position and pulled back to the veru and then slightly advanced.  Pressure  was then applied to the right lateral lobe and the UroLift device was deployed.  The second UroLift device was then inserted and applied to the left lateral lobe at 3 o'clock and deployed in the mid prostatic urethra. After this, there was still some apparent obstruction closer to the bladder neck.  So a second level of UroLift your left device was applied between the mid urethra and the proximal urethra providing further patency to the prostatic urethra.  At this point, there was mild bleeding but the patient did have a spinal anesthetic.  So it was thought that a Foley catheter was indicated.  The scope was removed and a 16-French Foley catheter was inserted without difficulty.  The balloon was filled with 10 mL sterile fluid, and the catheter was placed to straight drainage.  The patient was moved to recovery room in stable condition.  The catheter will be maintained until his spinal anesthetic is resolved, and he will be discharged home after a voiding trial.     Marshall Cork. Jeffie Pollock, M.D.     JJW/MEDQ  D:  06/01/2015  T:  06/02/2015  Job:  [id number]

## 2015-06-03 ENCOUNTER — Other Ambulatory Visit: Payer: Self-pay | Admitting: Endocrinology

## 2015-06-05 ENCOUNTER — Emergency Department (HOSPITAL_COMMUNITY)
Admission: EM | Admit: 2015-06-05 | Discharge: 2015-06-05 | Disposition: A | Discharge status: Home / Self Care | Payer: Medicare Other | Attending: Emergency Medicine | Admitting: Emergency Medicine

## 2015-06-05 ENCOUNTER — Encounter (HOSPITAL_COMMUNITY): Payer: Self-pay | Admitting: Emergency Medicine

## 2015-06-05 DIAGNOSIS — F329 Major depressive disorder, single episode, unspecified: Secondary | ICD-10-CM | POA: Diagnosis not present

## 2015-06-05 DIAGNOSIS — I2510 Atherosclerotic heart disease of native coronary artery without angina pectoris: Secondary | ICD-10-CM | POA: Insufficient documentation

## 2015-06-05 DIAGNOSIS — Z79899 Other long term (current) drug therapy: Secondary | ICD-10-CM | POA: Diagnosis not present

## 2015-06-05 DIAGNOSIS — R339 Retention of urine, unspecified: Secondary | ICD-10-CM | POA: Diagnosis not present

## 2015-06-05 DIAGNOSIS — M1990 Unspecified osteoarthritis, unspecified site: Secondary | ICD-10-CM | POA: Diagnosis not present

## 2015-06-05 DIAGNOSIS — R319 Hematuria, unspecified: Secondary | ICD-10-CM | POA: Diagnosis present

## 2015-06-05 DIAGNOSIS — J45909 Unspecified asthma, uncomplicated: Secondary | ICD-10-CM | POA: Insufficient documentation

## 2015-06-05 DIAGNOSIS — J449 Chronic obstructive pulmonary disease, unspecified: Secondary | ICD-10-CM | POA: Insufficient documentation

## 2015-06-05 DIAGNOSIS — E039 Hypothyroidism, unspecified: Secondary | ICD-10-CM | POA: Insufficient documentation

## 2015-06-05 LAB — URINALYSIS, ROUTINE W REFLEX MICROSCOPIC
Glucose, UA: 250 mg/dL — AB
Ketones, ur: 40 mg/dL — AB
Nitrite: POSITIVE — AB
Protein, ur: >300 mg/dL — AB
Specific Gravity, Urine: 1.01 (ref 1.005–1.030)
pH: 6.5 (ref 5.0–8.0)

## 2015-06-05 LAB — URINE MICROSCOPIC-ADD ON

## 2015-06-05 MED ORDER — CIPROFLOXACIN HCL 250 MG PO TABS: ORAL_TABLET | ORAL | Status: DC

## 2015-06-05 NOTE — ED Provider Notes (Signed)
CSN: [id number]     Arrival date & time 06/05/15  1559 History   First MD Initiated Contact with Patient 06/05/15 1705     Chief Complaint  Patient presents with  . Hematuria     (Consider location/radiation/quality/duration/timing/severity/associated sxs/prior Treatment) Patient is a 82 y.o. male presenting with hematuria. The history is provided by the patient (Patient states that he had a recent procedure on his prostate had a Foley taken out Tuesday and now he is having difficulty urinating).  Hematuria This is a recurrent problem. The current episode started 12 to 24 hours ago. The problem occurs constantly. The problem has not changed since onset.Associated symptoms include abdominal pain. Pertinent negatives include no chest pain and no headaches. Nothing aggravates the symptoms. Nothing relieves the symptoms.    Past Medical History  Diagnosis Date  . Hypothyroidism   . Anemia   . Blood transfusion june 2011, 5 units given    july 2011 some units given  . Enlarged prostate     elevated psa recently  . GERD (gastroesophageal reflux disease)   . Arthritis   . Dizziness     occasional  . Mass, scrotum     L scrotum - Sees Dr. Jeffie Pollock  . Abnormality of gait 06/07/2013  . Pituitary macroadenoma ([place])   . Restless legs syndrome (RLS)   . Asthma   . Cataract     bilateral removal of cateracts  . Depression   . Complication of anesthesia      no issues,but pt prefers spinal due to Pulmonary problems  . CAD (coronary artery disease)     left main had  a 20% stenosis.  Left anterior descending had 40-50% proximal   stenosis.  Left circumflex had an 80% proximal stenosis.  The  first obtuse marginal artery had a 60% mid stenosis.  The  right  coronary artery  had  a 50-60% proximal stenosis with a 60-70%  mid stenosis  The patient was managed medically. -Dr. Percival Spanish follows -last visit 7'16.   . Pneumonia     No recent.  . Emphysema   . COPD (chronic obstructive pulmonary  disease) ([place])     oxygen  on standby in home.  . Enlarged prostate with urinary retention   . Transfusion history     history 4 yrs ago.    Past Surgical History  Procedure Laterality Date  . Coronary stent placement  5 yrs ago  . Hiatal hernia repair  01-04-2008  . Foot surgery  1994 left, 2002 right foot    bilateral foot reconstruciton  . Abdominal hernia repair   2008  . Cataract extraction both eyes    . Total hip arthroplasty  07/21/2011    Procedure: TOTAL HIP ARTHROPLASTY ANTERIOR APPROACH;  Surgeon: Mauri Pole, MD;  Location: [facility];  Service: Orthopedics;  Laterality: Left;  . Coronary angioplasty    . Total hip arthroplasty Right 09/07/2012    Procedure: RIGHT TOTAL HIP ARTHROPLASTY ANTERIOR APPROACH;  Surgeon: Mcarthur Rossetti, MD;  Location: [facility];  Service: Orthopedics;  Laterality: Right;  . Foot surgery      reconstruction of both feet- no retained hardware.  . Esophagogastroduodenoscopy N/A 04/20/2013    Procedure: ESOPHAGOGASTRODUODENOSCOPY (EGD);  Surgeon: Inda Castle, MD;  Location: [facility];  Service: Endoscopy;  Laterality: N/A;  . Colonoscopy    . Upper gastrointestinal endoscopy    . Cystoscopy with insertion of urolift N/A 06/01/2015    Procedure: CYSTOSCOPY WITH INSERTION  OF UROLIFT x4;  Surgeon: Irine Seal, MD;  Location: [facility];  Service: Urology;  Laterality: N/A;   Family History  Problem Relation Age of Onset  . Heart disease Sister   . Heart failure Father   . Colon cancer Neg Hx   . Esophageal cancer Neg Hx   . Rectal cancer Neg Hx   . Stomach cancer Neg Hx    Social History  Substance Use Topics  . Smoking status: Never Smoker   . Smokeless tobacco: Never Used  . Alcohol Use: No    Review of Systems  Constitutional: Negative for appetite change and fatigue.  HENT: Negative for congestion, ear discharge and sinus pressure.   Eyes: Negative for discharge.  Respiratory: Negative for cough.   Cardiovascular: Negative for  chest pain.  Gastrointestinal: Positive for abdominal pain. Negative for diarrhea.  Genitourinary: Positive for hematuria. Negative for frequency.       Urinary retention  Musculoskeletal: Negative for back pain.  Skin: Negative for rash.  Neurological: Negative for seizures and headaches.  Psychiatric/Behavioral: Negative for hallucinations.      Allergies  Betadine; Lortab; Penicillins; Zetia; and Zocor  Home Medications   Prior to Admission medications   Medication Sig Start Date End Date Taking? Authorizing Provider  albuterol (PROVENTIL HFA;VENTOLIN HFA) 108 (90 BASE) MCG/ACT inhaler Inhale 2 puffs into the lungs every 6 (six) hours as needed for wheezing or shortness of breath.   Yes Historical Provider, MD  alfuzosin (UROXATRAL) 10 MG 24 hr tablet Take 10 mg by mouth daily with breakfast.   Yes Historical Provider, MD  bromocriptine (PARLODEL) 2.5 MG tablet TAKE  (1)  TABLET TWICE A DAY. 04/06/15  Yes Chipper Herb, MD  calcium carbonate (OS-CAL) 600 MG TABS tablet Take 600 mg by mouth 2 (two) times daily with a meal.   Yes Historical Provider, MD  cholecalciferol (VITAMIN D) 1000 UNITS tablet Take 1,000 Units by mouth 2 (two) times daily.   Yes Historical Provider, MD  cyanocobalamin 100 MCG tablet Take 100 mcg by mouth daily.   Yes Historical Provider, MD  ferrous sulfate 325 (65 FE) MG tablet Take 325 mg by mouth daily.    Yes Historical Provider, MD  guaiFENesin (MUCINEX) 600 MG 12 hr tablet Take 600 mg by mouth 2 (two) times daily.    Yes Historical Provider, MD  levothyroxine (SYNTHROID, LEVOTHROID) 50 MCG tablet TAKE 1 TABLET IN MORNING 06/03/15  Yes Renato Shin, MD  magnesium oxide (MAG-OX) 400 MG tablet Take 400 mg by mouth daily.   Yes Historical Provider, MD  mineral oil enema Place 1 enema rectally once as needed for mild constipation, moderate constipation or severe constipation.   Yes Historical Provider, MD  montelukast (SINGULAIR) 10 MG tablet TAKE 1 TABLET  DAILY Patient taking differently: TAKE 1 TABLET DAILY AS NEEDED FOR SEASONAL ALLERGIES   Yes Chipper Herb, MD  nitroGLYCERIN (NITROSTAT) 0.4 MG SL tablet Place 0.4 mg under the tongue every 5 (five) minutes as needed for chest pain.   Yes Historical Provider, MD  pantoprazole (PROTONIX) 40 MG tablet Take 1 tablet (40 mg total) by mouth 2 (two) times daily before a meal . 10/13/14  Yes Lafayette Dragon, MD  polyethylene glycol (MIRALAX / GLYCOLAX) packet Take 17 g by mouth daily. Patient taking differently: Take 17 g by mouth daily as needed for mild constipation.  04/22/13  Yes Hosie Poisson, MD  sucralfate (CARAFATE) 1 g tablet Take 1 tablet (1 g total)  by mouth 4 (four) times daily -  with meals and at bedtime. As needed Patient taking differently: Take 1 g by mouth at bedtime. *may take up to 4 times daily* *dissolves tablet* 05/04/15  Yes Jerene Bears, MD  SYMBICORT 160-4.5 MCG/ACT inhaler Inhale 2 puffs into the lungs 2 (two) times daily. 02/16/15  Yes Chipper Herb, MD  tetrahydrozoline (VISINE) 0.05 % ophthalmic solution Place 1 drop into both eyes as needed (For dry eyes).   Yes Historical Provider, MD  traMADol-acetaminophen (ULTRACET) 37.5-325 MG tablet Take 1 tablet by mouth every 6 (six) hours as needed. Patient taking differently: Take 1 tablet by mouth every 6 (six) hours as needed for moderate pain or severe pain.  06/01/15  Yes Irine Seal, MD  vitamin E (VITAMIN E) 400 UNIT capsule Take 400 Units by mouth daily.   Yes Historical Provider, MD  ciprofloxacin (CIPRO) 250 MG tablet One po bid x 7 days 06/05/15   Milton Ferguson, MD   BP 134/75 mmHg  Pulse 87  Temp(Src) 97.9 F (36.6 C) (Oral)  Resp 15  SpO2 99% Physical Exam  Constitutional: He is oriented to person, place, and time. He appears well-developed.  HENT:  Head: Normocephalic.  Eyes: Conjunctivae and EOM are normal. No scleral icterus.  Neck: Neck supple. No thyromegaly present.  Cardiovascular: Normal rate and regular rhythm.   Exam reveals no gallop and no friction rub.   No murmur heard. Pulmonary/Chest: No stridor. He has no wheezes. He has no rales. He exhibits no tenderness.  Abdominal: He exhibits no distension. There is tenderness. There is no rebound.  Mild distention  Musculoskeletal: Normal range of motion. He exhibits no edema.  Lymphadenopathy:    He has no cervical adenopathy.  Neurological: He is oriented to person, place, and time. He exhibits normal muscle tone. Coordination normal.  Skin: No rash noted. No erythema.  Psychiatric: He has a normal mood and affect. His behavior is normal.    ED Course  Procedures (including critical care time) Labs Review Labs Reviewed  URINALYSIS, ROUTINE W REFLEX MICROSCOPIC (NOT AT [facility]) - Abnormal; Notable for the following:    Color, Urine RED (*)    APPearance TURBID (*)    Glucose, UA 250 (*)    Hgb urine dipstick LARGE (*)    Bilirubin Urine MODERATE (*)    Ketones, ur 40 (*)    Protein, ur >300 (*)    Nitrite POSITIVE (*)    Leukocytes, UA LARGE (*)    All other components within normal limits  URINE MICROSCOPIC-ADD ON - Abnormal; Notable for the following:    Squamous Epithelial / LPF 0-5 (*)    Bacteria, UA FEW (*)    All other components within normal limits  URINE CULTURE    Imaging Review No results found. I have personally reviewed and evaluated these images and lab results as part of my medical decision-making.   EKG Interpretation None      MDM   Final diagnoses:  Urinary retention    Patient with urinary retention. Patient had a Foley placed and drained at least 800 mL of bloody urine. Urine is cultured patient will be put back on Cipro will follow-up with the urologist    Milton Ferguson, MD 06/05/15 [id number]

## 2015-06-05 NOTE — Discharge Instructions (Signed)
Follow-up with your urologist next week °

## 2015-06-05 NOTE — ED Notes (Signed)
PT states blood clots in urine and urinary retention that started back yesterday. PT states had procedure this past Monday and had catheter placed then removed on Tuesday. PT called urology and was told to come to ED for evaluation.

## 2015-06-08 DIAGNOSIS — N401 Enlarged prostate with lower urinary tract symptoms: Secondary | ICD-10-CM | POA: Diagnosis not present

## 2015-06-08 DIAGNOSIS — R3912 Poor urinary stream: Secondary | ICD-10-CM | POA: Diagnosis not present

## 2015-06-08 DIAGNOSIS — N138 Other obstructive and reflux uropathy: Secondary | ICD-10-CM | POA: Diagnosis not present

## 2015-06-08 LAB — URINE CULTURE: Special Requests: NORMAL

## 2015-06-10 ENCOUNTER — Inpatient Hospital Stay (HOSPITAL_COMMUNITY): Payer: Medicare Other | Admitting: Certified Registered Nurse Anesthetist

## 2015-06-10 ENCOUNTER — Encounter (HOSPITAL_COMMUNITY): Payer: Self-pay | Admitting: Oncology

## 2015-06-10 ENCOUNTER — Other Ambulatory Visit: Payer: Self-pay | Admitting: Urology

## 2015-06-10 ENCOUNTER — Encounter (HOSPITAL_COMMUNITY)
Admission: EM | Disposition: A | Discharge status: Home / Self Care | Payer: Self-pay | Source: Home / Self Care | Attending: Emergency Medicine

## 2015-06-10 ENCOUNTER — Observation Stay (HOSPITAL_COMMUNITY)
Admission: EM | Admit: 2015-06-10 | Discharge: 2015-06-11 | DRG: 666 | Disposition: A | Discharge status: Home / Self Care | Payer: Medicare Other | Attending: Urology | Admitting: Urology

## 2015-06-10 DIAGNOSIS — D352 Benign neoplasm of pituitary gland: Secondary | ICD-10-CM | POA: Diagnosis present

## 2015-06-10 DIAGNOSIS — Z79899 Other long term (current) drug therapy: Secondary | ICD-10-CM

## 2015-06-10 DIAGNOSIS — N3289 Other specified disorders of bladder: Secondary | ICD-10-CM | POA: Diagnosis present

## 2015-06-10 DIAGNOSIS — J45909 Unspecified asthma, uncomplicated: Secondary | ICD-10-CM | POA: Diagnosis present

## 2015-06-10 DIAGNOSIS — D62 Acute posthemorrhagic anemia: Secondary | ICD-10-CM | POA: Diagnosis not present

## 2015-06-10 DIAGNOSIS — I2510 Atherosclerotic heart disease of native coronary artery without angina pectoris: Secondary | ICD-10-CM | POA: Diagnosis present

## 2015-06-10 DIAGNOSIS — Z7952 Long term (current) use of systemic steroids: Secondary | ICD-10-CM

## 2015-06-10 DIAGNOSIS — R338 Other retention of urine: Secondary | ICD-10-CM | POA: Diagnosis not present

## 2015-06-10 DIAGNOSIS — Z880 Allergy status to penicillin: Secondary | ICD-10-CM | POA: Diagnosis not present

## 2015-06-10 DIAGNOSIS — G2581 Restless legs syndrome: Secondary | ICD-10-CM | POA: Diagnosis not present

## 2015-06-10 DIAGNOSIS — R319 Hematuria, unspecified: Secondary | ICD-10-CM | POA: Diagnosis present

## 2015-06-10 DIAGNOSIS — Z8249 Family history of ischemic heart disease and other diseases of the circulatory system: Secondary | ICD-10-CM | POA: Diagnosis not present

## 2015-06-10 DIAGNOSIS — Z888 Allergy status to other drugs, medicaments and biological substances status: Secondary | ICD-10-CM | POA: Diagnosis not present

## 2015-06-10 DIAGNOSIS — R310 Gross hematuria: Secondary | ICD-10-CM | POA: Diagnosis not present

## 2015-06-10 DIAGNOSIS — Z96643 Presence of artificial hip joint, bilateral: Secondary | ICD-10-CM | POA: Diagnosis present

## 2015-06-10 DIAGNOSIS — N138 Other obstructive and reflux uropathy: Secondary | ICD-10-CM | POA: Diagnosis not present

## 2015-06-10 DIAGNOSIS — Z951 Presence of aortocoronary bypass graft: Secondary | ICD-10-CM | POA: Diagnosis not present

## 2015-06-10 DIAGNOSIS — Z885 Allergy status to narcotic agent status: Secondary | ICD-10-CM | POA: Diagnosis not present

## 2015-06-10 DIAGNOSIS — N401 Enlarged prostate with lower urinary tract symptoms: Secondary | ICD-10-CM | POA: Diagnosis present

## 2015-06-10 DIAGNOSIS — F329 Major depressive disorder, single episode, unspecified: Secondary | ICD-10-CM | POA: Diagnosis present

## 2015-06-10 DIAGNOSIS — N029 Recurrent and persistent hematuria with unspecified morphologic changes: Secondary | ICD-10-CM | POA: Diagnosis present

## 2015-06-10 DIAGNOSIS — R339 Retention of urine, unspecified: Secondary | ICD-10-CM | POA: Diagnosis not present

## 2015-06-10 DIAGNOSIS — K219 Gastro-esophageal reflux disease without esophagitis: Secondary | ICD-10-CM | POA: Diagnosis present

## 2015-06-10 DIAGNOSIS — J449 Chronic obstructive pulmonary disease, unspecified: Secondary | ICD-10-CM | POA: Diagnosis present

## 2015-06-10 DIAGNOSIS — E039 Hypothyroidism, unspecified: Secondary | ICD-10-CM | POA: Diagnosis not present

## 2015-06-10 HISTORY — PX: CYSTOSCOPY: SHX5120

## 2015-06-10 LAB — CBC WITH DIFFERENTIAL/PLATELET
Basophils Absolute: 0.1 10*3/uL (ref 0.0–0.1)
Basophils Relative: 1 %
Eosinophils Absolute: 0.2 10*3/uL (ref 0.0–0.7)
Eosinophils Relative: 2 %
HCT: 34.1 % — ABNORMAL LOW (ref 39.0–52.0)
Hemoglobin: 11.5 g/dL — ABNORMAL LOW (ref 13.0–17.0)
Lymphocytes Relative: 11 %
Lymphs Abs: 1.1 10*3/uL (ref 0.7–4.0)
MCH: 31.1 pg (ref 26.0–34.0)
MCHC: 33.7 g/dL (ref 30.0–36.0)
MCV: 92.2 fL (ref 78.0–100.0)
Monocytes Absolute: 1.1 10*3/uL — ABNORMAL HIGH (ref 0.1–1.0)
Monocytes Relative: 11 %
Neutro Abs: 7.4 10*3/uL (ref 1.7–7.7)
Neutrophils Relative %: 75 %
Platelets: 213 10*3/uL (ref 150–400)
RBC: 3.7 MIL/uL — ABNORMAL LOW (ref 4.22–5.81)
RDW: 14.1 % (ref 11.5–15.5)
WBC: 9.9 10*3/uL (ref 4.0–10.5)

## 2015-06-10 LAB — URINALYSIS, ROUTINE W REFLEX MICROSCOPIC
Glucose, UA: NEGATIVE mg/dL
Ketones, ur: 40 mg/dL — AB
Nitrite: POSITIVE — AB
Protein, ur: 100 mg/dL — AB
Specific Gravity, Urine: 1.01 (ref 1.005–1.030)
pH: 5 (ref 5.0–8.0)

## 2015-06-10 LAB — COMPREHENSIVE METABOLIC PANEL
ALT: 15 U/L — ABNORMAL LOW (ref 17–63)
AST: 24 U/L (ref 15–41)
Albumin: 3.6 g/dL (ref 3.5–5.0)
Alkaline Phosphatase: 69 U/L (ref 38–126)
Anion gap: 8 (ref 5–15)
BUN: 14 mg/dL (ref 6–20)
CO2: 23 mmol/L (ref 22–32)
Calcium: 8.6 mg/dL — ABNORMAL LOW (ref 8.9–10.3)
Chloride: 108 mmol/L (ref 101–111)
Creatinine, Ser: 1.04 mg/dL (ref 0.61–1.24)
GFR calc Af Amer: >60 mL/min (ref >60)
GFR calc non Af Amer: >60 mL/min (ref >60)
Glucose, Bld: 97 mg/dL (ref 65–99)
Potassium: 4 mmol/L (ref 3.5–5.1)
Sodium: 139 mmol/L (ref 135–145)
Total Bilirubin: 0.7 mg/dL (ref 0.3–1.2)
Total Protein: 6.3 g/dL — ABNORMAL LOW (ref 6.5–8.1)

## 2015-06-10 LAB — URINE MICROSCOPIC-ADD ON

## 2015-06-10 SURGERY — CYSTOSCOPY
Anesthesia: Spinal | Site: Bladder

## 2015-06-10 MED ORDER — LACTATED RINGERS IV SOLN
INTRAVENOUS | Status: DC | PRN
  Administered 2015-06-10: 11:00:00 via INTRAVENOUS

## 2015-06-10 MED ORDER — PANTOPRAZOLE SODIUM 40 MG PO TBEC
40.0000 mg | DELAYED_RELEASE_TABLET | Freq: Two times a day (BID) | ORAL | Status: DC
  Administered 2015-06-10 – 2015-06-11 (×2): 40 mg via ORAL
  Filled 2015-06-10: qty 1

## 2015-06-10 MED ORDER — FERROUS SULFATE 325 (65 FE) MG PO TABS
325.0000 mg | ORAL_TABLET | Freq: Every day | ORAL | Status: DC
  Administered 2015-06-10 – 2015-06-11 (×2): 325 mg via ORAL
  Filled 2015-06-10 (×2): qty 1

## 2015-06-10 MED ORDER — SODIUM CHLORIDE 0.9 % IV SOLN
INTRAVENOUS | Status: DC
  Administered 2015-06-10: 08:00:00 via INTRAVENOUS
  Administered 2015-06-10: 1000 mL via INTRAVENOUS

## 2015-06-10 MED ORDER — ONDANSETRON HCL 4 MG/2ML IJ SOLN
INTRAMUSCULAR | Status: AC
  Filled 2015-06-10: qty 2

## 2015-06-10 MED ORDER — SUCRALFATE 1 G PO TABS
1.0000 g | ORAL_TABLET | Freq: Every day | ORAL | Status: DC
  Administered 2015-06-10: 1 g via ORAL
  Filled 2015-06-10 (×2): qty 1

## 2015-06-10 MED ORDER — BELLADONNA ALKALOIDS-OPIUM 16.2-60 MG RE SUPP
RECTAL | Status: AC
  Filled 2015-06-10: qty 1

## 2015-06-10 MED ORDER — METOCLOPRAMIDE HCL 5 MG/ML IJ SOLN
INTRAMUSCULAR | Status: AC
  Filled 2015-06-10: qty 2

## 2015-06-10 MED ORDER — HYDROMORPHONE HCL 1 MG/ML IJ SOLN
0.5000 mg | INTRAMUSCULAR | Status: DC | PRN
  Administered 2015-06-10 (×3): 1 mg via INTRAVENOUS
  Filled 2015-06-10 (×3): qty 1

## 2015-06-10 MED ORDER — ONDANSETRON HCL 4 MG/2ML IJ SOLN
4.0000 mg | Freq: Once | INTRAMUSCULAR | Status: AC
  Administered 2015-06-10: 4 mg via INTRAVENOUS
  Filled 2015-06-10: qty 2

## 2015-06-10 MED ORDER — SENNOSIDES-DOCUSATE SODIUM 8.6-50 MG PO TABS
1.0000 | ORAL_TABLET | Freq: Two times a day (BID) | ORAL | Status: DC
  Administered 2015-06-10 – 2015-06-11 (×3): 1 via ORAL
  Filled 2015-06-10 (×3): qty 1

## 2015-06-10 MED ORDER — FENTANYL CITRATE (PF) 100 MCG/2ML IJ SOLN
INTRAMUSCULAR | Status: DC | PRN
  Administered 2015-06-10: 12.5 ug via INTRAVENOUS
  Administered 2015-06-10: 25 ug via INTRAVENOUS
  Administered 2015-06-10: 12.5 ug via INTRAVENOUS

## 2015-06-10 MED ORDER — SULFAMETHOXAZOLE-TRIMETHOPRIM 800-160 MG PO TABS
1.0000 | ORAL_TABLET | Freq: Two times a day (BID) | ORAL | Status: DC
  Administered 2015-06-10 – 2015-06-11 (×3): 1 via ORAL
  Filled 2015-06-10 (×3): qty 1

## 2015-06-10 MED ORDER — PROPOFOL 10 MG/ML IV BOLUS
INTRAVENOUS | Status: AC
  Filled 2015-06-10: qty 20

## 2015-06-10 MED ORDER — TRAMADOL-ACETAMINOPHEN 37.5-325 MG PO TABS: 1.0000 | ORAL_TABLET | Freq: Four times a day (QID) | ORAL | Status: DC | PRN

## 2015-06-10 MED ORDER — MOMETASONE FURO-FORMOTEROL FUM 200-5 MCG/ACT IN AERO
2.0000 | INHALATION_SPRAY | Freq: Two times a day (BID) | RESPIRATORY_TRACT | Status: DC
  Administered 2015-06-10 – 2015-06-11 (×2): 2 via RESPIRATORY_TRACT
  Filled 2015-06-10: qty 8.8

## 2015-06-10 MED ORDER — ROCURONIUM BROMIDE 100 MG/10ML IV SOLN
INTRAVENOUS | Status: AC
  Filled 2015-06-10: qty 1

## 2015-06-10 MED ORDER — POLYETHYLENE GLYCOL 3350 17 G PO PACK: 17.0000 g | PACK | Freq: Every day | ORAL | Status: DC | PRN

## 2015-06-10 MED ORDER — LIDOCAINE HCL 2 % EX GEL
CUTANEOUS | Status: AC
  Filled 2015-06-10: qty 5

## 2015-06-10 MED ORDER — CIPROFLOXACIN IN D5W 400 MG/200ML IV SOLN
400.0000 mg | INTRAVENOUS | Status: AC
  Administered 2015-06-10: 400 mg via INTRAVENOUS

## 2015-06-10 MED ORDER — VITAMIN D 1000 UNITS PO TABS
1000.0000 [IU] | ORAL_TABLET | Freq: Two times a day (BID) | ORAL | Status: DC
  Administered 2015-06-10 – 2015-06-11 (×2): 1000 [IU] via ORAL
  Filled 2015-06-10 (×2): qty 1

## 2015-06-10 MED ORDER — PROPOFOL 10 MG/ML IV BOLUS
INTRAVENOUS | Status: DC | PRN
  Administered 2015-06-10: 20 mg via INTRAVENOUS
  Administered 2015-06-10: 60 mg via INTRAVENOUS
  Administered 2015-06-10: 20 mg via INTRAVENOUS

## 2015-06-10 MED ORDER — ACETAMINOPHEN 500 MG PO TABS
1000.0000 mg | ORAL_TABLET | Freq: Three times a day (TID) | ORAL | Status: AC
  Administered 2015-06-10 – 2015-06-11 (×3): 1000 mg via ORAL
  Filled 2015-06-10 (×4): qty 2

## 2015-06-10 MED ORDER — MAGNESIUM OXIDE 400 (241.3 MG) MG PO TABS
400.0000 mg | ORAL_TABLET | Freq: Every day | ORAL | Status: DC
  Administered 2015-06-10 – 2015-06-11 (×2): 400 mg via ORAL
  Filled 2015-06-10 (×2): qty 1

## 2015-06-10 MED ORDER — EPHEDRINE SULFATE 50 MG/ML IJ SOLN
INTRAMUSCULAR | Status: DC | PRN
  Administered 2015-06-10: 5 mg via INTRAVENOUS
  Administered 2015-06-10: 10 mg via INTRAVENOUS

## 2015-06-10 MED ORDER — BROMOCRIPTINE MESYLATE 2.5 MG PO TABS
2.5000 mg | ORAL_TABLET | Freq: Two times a day (BID) | ORAL | Status: DC
  Administered 2015-06-10 – 2015-06-11 (×2): 2.5 mg via ORAL
  Filled 2015-06-10 (×3): qty 1

## 2015-06-10 MED ORDER — MORPHINE SULFATE (PF) 2 MG/ML IV SOLN
2.0000 mg | Freq: Once | INTRAVENOUS | Status: AC
  Administered 2015-06-10: 2 mg via INTRAVENOUS
  Filled 2015-06-10: qty 1

## 2015-06-10 MED ORDER — LEVOTHYROXINE SODIUM 50 MCG PO TABS
50.0000 ug | ORAL_TABLET | Freq: Every day | ORAL | Status: DC
  Administered 2015-06-11: 50 ug via ORAL
  Filled 2015-06-10: qty 1

## 2015-06-10 MED ORDER — VITAMIN E 180 MG (400 UNIT) PO CAPS
400.0000 [IU] | ORAL_CAPSULE | Freq: Every day | ORAL | Status: DC
  Administered 2015-06-10 – 2015-06-11 (×2): 400 [IU] via ORAL
  Filled 2015-06-10 (×2): qty 1

## 2015-06-10 MED ORDER — VITAMIN B-12 100 MCG PO TABS
100.0000 ug | ORAL_TABLET | Freq: Every day | ORAL | Status: DC
  Administered 2015-06-10 – 2015-06-11 (×2): 100 ug via ORAL
  Filled 2015-06-10 (×2): qty 1

## 2015-06-10 MED ORDER — ALBUTEROL SULFATE (2.5 MG/3ML) 0.083% IN NEBU: 2.5000 mg | INHALATION_SOLUTION | Freq: Four times a day (QID) | RESPIRATORY_TRACT | Status: DC | PRN

## 2015-06-10 MED ORDER — NAPHAZOLINE-GLYCERIN 0.012-0.2 % OP SOLN: 1.0000 [drp] | Freq: Four times a day (QID) | OPHTHALMIC | Status: DC | PRN

## 2015-06-10 MED ORDER — MINERAL OIL RE ENEM: 1.0000 | ENEMA | Freq: Once | RECTAL | Status: DC | PRN

## 2015-06-10 MED ORDER — CIPROFLOXACIN IN D5W 400 MG/200ML IV SOLN
INTRAVENOUS | Status: AC
  Filled 2015-06-10: qty 200

## 2015-06-10 MED ORDER — GUAIFENESIN ER 600 MG PO TB12
600.0000 mg | ORAL_TABLET | Freq: Two times a day (BID) | ORAL | Status: DC
  Administered 2015-06-10 – 2015-06-11 (×2): 600 mg via ORAL
  Filled 2015-06-10 (×2): qty 1

## 2015-06-10 MED ORDER — FENTANYL CITRATE (PF) 100 MCG/2ML IJ SOLN
INTRAMUSCULAR | Status: AC
  Filled 2015-06-10: qty 2

## 2015-06-10 MED ORDER — ONDANSETRON HCL 4 MG/2ML IJ SOLN
INTRAMUSCULAR | Status: DC | PRN
  Administered 2015-06-10: 4 mg via INTRAVENOUS

## 2015-06-10 MED ORDER — ONDANSETRON HCL 4 MG/2ML IJ SOLN: 4.0000 mg | Freq: Once | INTRAMUSCULAR | Status: DC | PRN

## 2015-06-10 MED ORDER — LIDOCAINE HCL (CARDIAC) 20 MG/ML IV SOLN
INTRAVENOUS | Status: DC | PRN
  Administered 2015-06-10: 20 mg via INTRAVENOUS
  Administered 2015-06-10: 30 mg via INTRAVENOUS

## 2015-06-10 MED ORDER — MONTELUKAST SODIUM 10 MG PO TABS
10.0000 mg | ORAL_TABLET | Freq: Every day | ORAL | Status: DC
  Administered 2015-06-10 – 2015-06-11 (×2): 10 mg via ORAL
  Filled 2015-06-10 (×2): qty 1

## 2015-06-10 MED ORDER — NITROGLYCERIN 0.4 MG SL SUBL: 0.4000 mg | SUBLINGUAL_TABLET | SUBLINGUAL | Status: DC | PRN

## 2015-06-10 MED ORDER — ALFUZOSIN HCL ER 10 MG PO TB24
10.0000 mg | ORAL_TABLET | Freq: Every day | ORAL | Status: DC
  Administered 2015-06-11: 10 mg via ORAL
  Filled 2015-06-10 (×2): qty 1

## 2015-06-10 MED ORDER — METOCLOPRAMIDE HCL 5 MG/ML IJ SOLN
INTRAMUSCULAR | Status: DC | PRN
  Administered 2015-06-10: 10 mg via INTRAVENOUS

## 2015-06-10 MED ORDER — FENTANYL CITRATE (PF) 100 MCG/2ML IJ SOLN: 25.0000 ug | INTRAMUSCULAR | Status: DC | PRN

## 2015-06-10 MED ORDER — CALCIUM CARBONATE 1250 (500 CA) MG PO TABS
1250.0000 mg | ORAL_TABLET | Freq: Two times a day (BID) | ORAL | Status: DC
  Administered 2015-06-10 – 2015-06-11 (×2): 1250 mg via ORAL
  Filled 2015-06-10 (×2): qty 1

## 2015-06-10 MED ORDER — LIDOCAINE HCL (CARDIAC) 20 MG/ML IV SOLN
INTRAVENOUS | Status: AC
  Filled 2015-06-10: qty 5

## 2015-06-10 MED ORDER — OXYCODONE HCL 5 MG PO TABS: 5.0000 mg | ORAL_TABLET | ORAL | Status: DC | PRN

## 2015-06-10 SURGICAL SUPPLY — 17 items
BAG URINE DRAINAGE (UROLOGICAL SUPPLIES) ×3 IMPLANT
BAG URO CATCHER STRL LF (MISCELLANEOUS) ×3 IMPLANT
CATH FOLEY 3WAY 30CC 22FR (CATHETERS) ×3 IMPLANT
CATH URET 5FR 28IN OPEN ENDED (CATHETERS) IMPLANT
ELECT BIVAP BIPO 22/24 DONUT (ELECTROSURGICAL) ×3
ELECTRD BIVAP BIPO 22/24 DONUT (ELECTROSURGICAL) ×2 IMPLANT
GLOVE SURG SS PI 8.0 STRL IVOR (GLOVE) ×3 IMPLANT
GOWN STRL REUS W/TWL XL LVL3 (GOWN DISPOSABLE) ×3 IMPLANT
HOLDER FOLEY CATH W/STRAP (MISCELLANEOUS) ×3 IMPLANT
KIT ASPIRATION TUBING (SET/KITS/TRAYS/PACK) ×3 IMPLANT
LOOP CUT BIPOLAR 24F LRG (ELECTROSURGICAL) IMPLANT
MANIFOLD NEPTUNE II (INSTRUMENTS) ×3 IMPLANT
PACK CYSTO (CUSTOM PROCEDURE TRAY) ×3 IMPLANT
SUT ETHILON 3 0 PS 1 (SUTURE) IMPLANT
SYR 30ML LL (SYRINGE) ×3 IMPLANT
SYRINGE IRR TOOMEY STRL 70CC (SYRINGE) ×3 IMPLANT
TUBING CONNECTING 10 (TUBING) ×3 IMPLANT

## 2015-06-10 NOTE — ED Notes (Signed)
Urologist at bedside.

## 2015-06-10 NOTE — H&P (Signed)
[name] is an 82 y.o. male.    Chief Complaint: Recurrent Hematuria with Urinary Retention  HPI:   Recurrent Hematuria with Urinary Retention - s/p Urolift procedure 06/01/15. Some intermitant hematuria post-op with clot retention. Has most recently been catheter free and voiding with satisfaction but redeveloped some hematuria with small clots and retention again early AM. Most recent UCX non-clonal. No fevers.  In ER today he easily clears urine to very light pink with minimal catheter irrigation.   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 Denies any blood thinner use at present.   Today " [name] " is seen for ER eval and admission for above.   Past Medical History  Diagnosis Date  . Hypothyroidism   . Anemia   . Blood transfusion june 2011, 5 units given    july 2011 some units given  . Enlarged prostate     elevated psa recently  . GERD (gastroesophageal reflux disease)   . Arthritis   . Dizziness     occasional  . Mass, scrotum     L scrotum - Sees Dr. Jeffie [name]  . Abnormality of gait 06/07/2013  . Pituitary macroadenoma ([place])   . Restless legs syndrome (RLS)   . Asthma   . Cataract     bilateral removal of cateracts  . Depression   . Complication of anesthesia      no issues,but pt prefers spinal due to Pulmonary problems  . CAD (coronary artery disease)     left main had  a 20% stenosis.  Left anterior descending had 40-50% proximal   stenosis.  Left circumflex had an 80% proximal stenosis.  The  first obtuse marginal artery had a 60% mid stenosis.  The  right  coronary artery  had  a 50-60% proximal stenosis with a 60-70%  mid stenosis  The patient was managed medically. -Dr. Percival [name] follows -last visit 7'16.   . Pneumonia     No recent.  . Emphysema   . COPD (chronic obstructive pulmonary disease) ([place])     oxygen  on standby in home.  . Enlarged prostate with urinary retention   . Transfusion history     history 4 yrs ago.     Past Surgical History   Procedure Laterality Date  . Coronary stent placement  5 yrs ago  . Hiatal hernia repair  01-04-2008  . Foot surgery  1994 left, 2002 right foot    bilateral foot reconstruciton  . Abdominal hernia repair   2008  . Cataract extraction both eyes    . Total hip arthroplasty  07/21/2011    Procedure: TOTAL HIP ARTHROPLASTY ANTERIOR APPROACH;  Surgeon: Allen Pole, MD;  Location: [facility];  Service: Orthopedics;  Laterality: Left;  . Coronary angioplasty    . Total hip arthroplasty Right 09/07/2012    Procedure: RIGHT TOTAL HIP ARTHROPLASTY ANTERIOR APPROACH;  Surgeon: Allen Rossetti, MD;  Location: [facility];  Service: Orthopedics;  Laterality: Right;  . Foot surgery      reconstruction of both feet- no retained hardware.  . Esophagogastroduodenoscopy N/A 04/20/2013    Procedure: ESOPHAGOGASTRODUODENOSCOPY (EGD);  Surgeon: Allen Castle, MD;  Location: [facility];  Service: Endoscopy;  Laterality: N/A;  . Colonoscopy    . Upper gastrointestinal endoscopy    . Cystoscopy with insertion of urolift N/A 06/01/2015    Procedure: CYSTOSCOPY WITH INSERTION OF UROLIFT x4;  Surgeon: Allen Seal, MD;  Location: [facility];  Service: Urology;  Laterality: N/A;    Family History  Problem Relation Age of Onset  . Heart disease Sister   . Heart failure Father   . Colon cancer Neg Hx   . Esophageal cancer Neg Hx   . Rectal cancer Neg Hx   . Stomach cancer Neg Hx    Social History:  reports that he has never smoked. He has never used smokeless tobacco. He reports that he does not drink alcohol or use illicit drugs.  Allergies:  Allergies  Allergen Reactions  . Betadine [Povidone Iodine]     "Skin blisters"  . Lortab [Hydrocodone-Acetaminophen] Nausea And Vomiting  . Penicillins Other (See Comments)    Light headed Has patient had a PCN reaction causing immediate rash, facial/tongue/throat swelling, SOB or lightheadedness with hypotension: Yes Has patient had a PCN reaction causing severe rash  involving mucus membranes or skin necrosis: No Has patient had a PCN reaction that required hospitalization No Has patient had a PCN reaction occurring within the last 10 years: No If all of the above answers are "NO", then may proceed with Cephalosporin use.  Marland Kitchen Zetia [Ezetimibe] Other (See Comments)    Muscle weakness  . Zocor [Simvastatin] Nausea Only and Other (See Comments)    muscle weakness     (Not in a hospital admission)  Results for orders placed or performed during the hospital encounter of 06/10/15 (from the past 48 hour(s))  Urinalysis, Routine w reflex microscopic (not at [facility])     Status: Abnormal   Collection Time: 06/10/15  5:15 AM  Result Value Ref Range   Color, Urine RED (A) YELLOW    Comment: BIOCHEMICALS MAY BE AFFECTED BY COLOR   APPearance TURBID (A) CLEAR   Specific Gravity, Urine 1.010 1.005 - 1.030   pH 5.0 5.0 - 8.0   Glucose, UA NEGATIVE NEGATIVE mg/dL   Hgb urine dipstick LARGE (A) NEGATIVE   Bilirubin Urine LARGE (A) NEGATIVE   Ketones, ur 40 (A) NEGATIVE mg/dL   Protein, ur 100 (A) NEGATIVE mg/dL   Nitrite POSITIVE (A) NEGATIVE   Leukocytes, UA LARGE (A) NEGATIVE  Urine microscopic-add on     Status: Abnormal   Collection Time: 06/10/15  5:15 AM  Result Value Ref Range   Squamous Epithelial / LPF 0-5 (A) NONE SEEN   WBC, UA 6-30 0 - 5 WBC/hpf   RBC / HPF TOO NUMEROUS TO COUNT 0 - 5 RBC/hpf   Bacteria, UA MANY (A) NONE SEEN  CBC with Differential     Status: Abnormal   Collection Time: 06/10/15  5:25 AM  Result Value Ref Range   WBC 9.9 4.0 - 10.5 K/uL   RBC 3.70 (L) 4.22 - 5.81 MIL/uL   Hemoglobin 11.5 (L) 13.0 - 17.0 g/dL   HCT 34.1 (L) 39.0 - 52.0 %   MCV 92.2 78.0 - 100.0 fL   MCH 31.1 26.0 - 34.0 pg   MCHC 33.7 30.0 - 36.0 g/dL   RDW 14.1 11.5 - 15.5 %   Platelets 213 150 - 400 K/uL   Neutrophils Relative % 75 %   Neutro Abs 7.4 1.7 - 7.7 K/uL   Lymphocytes Relative 11 %   Lymphs Abs 1.1 0.7 - 4.0 K/uL   Monocytes Relative 11 %    Monocytes Absolute 1.1 (H) 0.1 - 1.0 K/uL   Eosinophils Relative 2 %   Eosinophils Absolute 0.2 0.0 - 0.7 K/uL   Basophils Relative 1 %   Basophils Absolute 0.1 0.0 - 0.1 K/uL  Comprehensive metabolic panel     Status: Abnormal   Collection Time: 06/10/15  5:25 AM  Result Value Ref Range   Sodium 139 135 - 145 mmol/L   Potassium 4.0 3.5 - 5.1 mmol/L   Chloride 108 101 - 111 mmol/L   CO2 23 22 - 32 mmol/L   Glucose, Bld 97 65 - 99 mg/dL   BUN 14 6 - 20 mg/dL   Creatinine, Ser 1.04 0.61 - 1.24 mg/dL   Calcium 8.6 (L) 8.9 - 10.3 mg/dL   Total Protein 6.3 (L) 6.5 - 8.1 g/dL   Albumin 3.6 3.5 - 5.0 g/dL   AST 24 15 - 41 U/L   ALT 15 (L) 17 - 63 U/L   Alkaline Phosphatase 69 38 - 126 U/L   Total Bilirubin 0.7 0.3 - 1.2 mg/dL   GFR calc non Af Amer >60 >60 mL/min   GFR calc Af Amer >60 >60 mL/min    Comment: (NOTE) The eGFR has been calculated using the CKD EPI equation. This calculation has not been validated in all clinical situations. eGFR's persistently <60 mL/min signify possible Chronic Kidney Disease.    Anion gap 8 5 - 15   No results found.  Review of Systems  Constitutional: Negative.  Negative for fever and chills.  HENT: Negative.   Eyes: Negative.   Respiratory: Negative.   Cardiovascular: Negative.   Gastrointestinal: Negative.  Negative for nausea and vomiting.  Genitourinary: Positive for frequency and hematuria. Negative for flank pain.  Skin: Negative.   Neurological: Negative.   Endo/Heme/Allergies: Negative.   Psychiatric/Behavioral: Negative.     Blood pressure 138/77, pulse 70, temperature 98.2 F (36.8 C), temperature source Oral, resp. rate 18, SpO2 98 %. Physical Exam  Constitutional: He appears well-developed and well-nourished.  Son at bedside  HENT:  Head: Normocephalic.  Eyes: Pupils are equal, round, and reactive to light.  Neck: Normal range of motion.  Cardiovascular: Normal rate.   Respiratory: Effort normal.  GI: Soft.   Non-obese  Genitourinary:  3 way catheter c/d/i with light red urine off irrigation.  Musculoskeletal: Normal range of motion.  Neurological: He is alert.  Skin: Skin is warm.  Psychiatric: He has a normal mood and affect. His behavior is normal. Judgment and thought content normal.    At bedside catheter hand-irrigated with 2L NS in 60cc aliquots. No large clots. Connected to NS irrigation at about 2 drops per second and efflux very, very light pink.   Assessment/Plan  Recurrent Hematuria with Urinary Retention - likely slow mucosal bleeding from prostatic fossa. Discussed with pt options of immediate OR intervention v. Trial of catheter traction / irrigation / conservative measures and we agree on latter for now as he has minimal clot on evaluation today.  Admit, 1/2 MIVF, NS bladder irrigation with moderate catheter traction, NPO for now.    Alexis Frock, MD 06/10/2015, 7:43 AM

## 2015-06-10 NOTE — ED Provider Notes (Signed)
CSN: [id number]     Arrival date & time 06/10/15  0214 History  By signing my name below, I, [name], attest that this documentation has been prepared under the direction and in the presence of Allen Greek, MD. Electronically Signed: Helane [name], ED Scribe. 06/10/2015. 3:31 AM.      Chief Complaint  Patient presents with  . Urinary Retention   The history is provided by the patient. No language interpreter was used.   HPI Comments: [name] is a 82 y.o. male who presents to the Emergency Department complaining of recurrent urinary retention onset 5 hours ago. Pt states he was last able to urinate 5 hours ago and reports associated feelings of pressure and fullness in the lower abdomen. He notes he has been having continued bleeding due to having prostate surgery 1 week ago. Since then, he has had his catheter changed multiple times, but is still continuing to bleed. He notes that his catheter is repeatedly clogged up by blood clots.   Past Medical History  Diagnosis Date  . Hypothyroidism   . Anemia   . Blood transfusion june 2011, 5 units given    july 2011 some units given  . Enlarged prostate     elevated psa recently  . GERD (gastroesophageal reflux disease)   . Arthritis   . Dizziness     occasional  . Mass, scrotum     L scrotum - Sees Dr. Jeffie Pollock  . Abnormality of gait 06/07/2013  . Pituitary macroadenoma ([place])   . Restless legs syndrome (RLS)   . Asthma   . Cataract     bilateral removal of cateracts  . Depression   . Complication of anesthesia      no issues,but pt prefers spinal due to Pulmonary problems  . CAD (coronary artery disease)     left main had  a 20% stenosis.  Left anterior descending had 40-50% proximal   stenosis.  Left circumflex had an 80% proximal stenosis.  The  first obtuse marginal artery had a 60% mid stenosis.  The  right  coronary artery  had  a 50-60% proximal stenosis with a 60-70%  mid stenosis  The patient was managed  medically. -Dr. Percival Spanish follows -last visit 7'16.   . Pneumonia     No recent.  . Emphysema   . COPD (chronic obstructive pulmonary disease) ([place])     oxygen  on standby in home.  . Enlarged prostate with urinary retention   . Transfusion history     history 4 yrs ago.    Past Surgical History  Procedure Laterality Date  . Coronary stent placement  5 yrs ago  . Hiatal hernia repair  01-04-2008  . Foot surgery  1994 left, 2002 right foot    bilateral foot reconstruciton  . Abdominal hernia repair   2008  . Cataract extraction both eyes    . Total hip arthroplasty  07/21/2011    Procedure: TOTAL HIP ARTHROPLASTY ANTERIOR APPROACH;  Surgeon: Mauri Pole, MD;  Location: [facility];  Service: Orthopedics;  Laterality: Left;  . Coronary angioplasty    . Total hip arthroplasty Right 09/07/2012    Procedure: RIGHT TOTAL HIP ARTHROPLASTY ANTERIOR APPROACH;  Surgeon: Mcarthur Rossetti, MD;  Location: [facility];  Service: Orthopedics;  Laterality: Right;  . Foot surgery      reconstruction of both feet- no retained hardware.  . Esophagogastroduodenoscopy N/A 04/20/2013    Procedure: ESOPHAGOGASTRODUODENOSCOPY (EGD);  Surgeon: Sandy Salaam  Deatra Ina, MD;  Location: [facility];  Service: Endoscopy;  Laterality: N/A;  . Colonoscopy    . Upper gastrointestinal endoscopy    . Cystoscopy with insertion of urolift N/A 06/01/2015    Procedure: CYSTOSCOPY WITH INSERTION OF UROLIFT x4;  Surgeon: Irine Seal, MD;  Location: [facility];  Service: Urology;  Laterality: N/A;   Family History  Problem Relation Age of Onset  . Heart disease Sister   . Heart failure Father   . Colon cancer Neg Hx   . Esophageal cancer Neg Hx   . Rectal cancer Neg Hx   . Stomach cancer Neg Hx    Social History  Substance Use Topics  . Smoking status: Never Smoker   . Smokeless tobacco: Never Used  . Alcohol Use: No    Review of Systems  Gastrointestinal: Positive for abdominal pain (pressure).  Genitourinary: Positive for  decreased urine volume and difficulty urinating.  All other systems reviewed and are negative.   Allergies  Betadine; Lortab; Penicillins; Zetia; and Zocor  Home Medications   Prior to Admission medications   Medication Sig Start Date End Date Taking? Authorizing Provider  albuterol (PROVENTIL HFA;VENTOLIN HFA) 108 (90 BASE) MCG/ACT inhaler Inhale 2 puffs into the lungs every 6 (six) hours as needed for wheezing or shortness of breath.   Yes Historical Provider, MD  alfuzosin (UROXATRAL) 10 MG 24 hr tablet Take 10 mg by mouth daily with breakfast.   Yes Historical Provider, MD  calcium carbonate (OS-CAL) 600 MG TABS tablet Take 600 mg by mouth 2 (two) times daily with a meal.   Yes Historical Provider, MD  cholecalciferol (VITAMIN D) 1000 UNITS tablet Take 1,000 Units by mouth 2 (two) times daily.   Yes Historical Provider, MD  cyanocobalamin 100 MCG tablet Take 100 mcg by mouth daily.   Yes Historical Provider, MD  ferrous sulfate 325 (65 FE) MG tablet Take 325 mg by mouth daily.    Yes Historical Provider, MD  guaiFENesin (MUCINEX) 600 MG 12 hr tablet Take 600 mg by mouth 2 (two) times daily.    Yes Historical Provider, MD  levothyroxine (SYNTHROID, LEVOTHROID) 50 MCG tablet TAKE 1 TABLET IN MORNING Patient taking differently: TAKE 50 MCG BY MOUTH EVER MORNING 06/03/15  Yes Renato Shin, MD  magnesium oxide (MAG-OX) 400 MG tablet Take 400 mg by mouth daily.   Yes Historical Provider, MD  mineral oil enema Place 1 enema rectally once as needed for mild constipation, moderate constipation or severe constipation.   Yes Historical Provider, MD  montelukast (SINGULAIR) 10 MG tablet TAKE 1 TABLET DAILY Patient taking differently: TAKE 1 TABLET DAILY AS NEEDED FOR SEASONAL ALLERGIES   Yes Chipper Herb, MD  nitroGLYCERIN (NITROSTAT) 0.4 MG SL tablet Place 0.4 mg under the tongue every 5 (five) minutes as needed for chest pain.   Yes Historical Provider, MD  pantoprazole (PROTONIX) 40 MG tablet  Take 1 tablet (40 mg total) by mouth 2 (two) times daily before a meal . 10/13/14  Yes Lafayette Dragon, MD  polyethylene glycol (MIRALAX / GLYCOLAX) packet Take 17 g by mouth daily. Patient taking differently: Take 17 g by mouth daily as needed for mild constipation.  04/22/13  Yes Hosie Poisson, MD  sucralfate (CARAFATE) 1 g tablet Take 1 tablet (1 g total) by mouth 4 (four) times daily -  with meals and at bedtime. As needed Patient taking differently: Take 1 g by mouth at bedtime. *may take up to 4 times daily* *dissolves  tablet* 05/04/15  Yes Jerene Bears, MD  SYMBICORT 160-4.5 MCG/ACT inhaler Inhale 2 puffs into the lungs 2 (two) times daily. 02/16/15  Yes Chipper Herb, MD  tetrahydrozoline (VISINE) 0.05 % ophthalmic solution Place 1 drop into both eyes as needed (For dry eyes).   Yes Historical Provider, MD  traMADol-acetaminophen (ULTRACET) 37.5-325 MG tablet Take 1 tablet by mouth every 6 (six) hours as needed. Patient taking differently: Take 1 tablet by mouth every 6 (six) hours as needed for moderate pain or severe pain.  06/01/15  Yes Irine Seal, MD  vitamin E (VITAMIN E) 400 UNIT capsule Take 400 Units by mouth daily.   Yes Historical Provider, MD  bromocriptine (PARLODEL) 2.5 MG tablet TAKE  (1)  TABLET TWICE A DAY. 04/06/15   Chipper Herb, MD  ciprofloxacin (CIPRO) 250 MG tablet One po bid x 7 days 06/05/15   Milton Ferguson, MD   BP 138/79 mmHg  Pulse 80  Temp(Src) 98 F (36.7 C) (Oral)  Resp 15  SpO2 95% Physical Exam  Constitutional: He is oriented to person, place, and time. He appears well-developed and well-nourished. No distress.  HENT:  Head: Normocephalic and atraumatic.  Right Ear: Hearing normal.  Left Ear: Hearing normal.  Nose: Nose normal.  Mouth/Throat: Oropharynx is clear and moist and mucous membranes are normal.  Eyes: Conjunctivae and EOM are normal. Pupils are equal, round, and reactive to light.  Neck: Normal range of motion. Neck supple.  Cardiovascular: Regular  rhythm, S1 normal and S2 normal.  Exam reveals no gallop and no friction rub.   No murmur heard. Pulmonary/Chest: Effort normal and breath sounds normal. No respiratory distress. He exhibits no tenderness.  Abdominal: Soft. Normal appearance and bowel sounds are normal. There is no hepatosplenomegaly. There is tenderness (mild, suprapubic). There is no rebound, no guarding, no tenderness at McBurney's point and negative Murphy's sign. No hernia.  Musculoskeletal: Normal range of motion.  Neurological: He is alert and oriented to person, place, and time. He has normal strength. No cranial nerve deficit or sensory deficit. Coordination normal. GCS eye subscore is 4. GCS verbal subscore is 5. GCS motor subscore is 6.  Skin: Skin is warm, dry and intact. No rash noted. No cyanosis.  Psychiatric: He has a normal mood and affect. His speech is normal and behavior is normal. Thought content normal.  Nursing note and vitals reviewed.   ED Course  Procedures  DIAGNOSTIC STUDIES: Oxygen Saturation is 95% on RA, adequate by my interpretation.    COORDINATION OF CARE: 3:09 AM - Discussed plans to change pt's catheter to a larger one to drain the blood clots, as well as to irrigate thoroughly. Pt advised of plan for treatment and pt agrees.  Labs Review Labs Reviewed  URINALYSIS, ROUTINE W REFLEX MICROSCOPIC (NOT AT [facility])    Imaging Review No results found. I have personally reviewed and evaluated these images and lab results as part of my medical decision-making.   EKG Interpretation None      MDM   Final diagnoses:  None  Urinary retention Hematuria  Patient presents to the emergency department for evaluation of urinary retention. Patient had cystoscopy with Urolift placement on April 3. He has required Foley catheter for hematuria since the procedure. Catheter has been removed and now he is presenting with urinary retention. Patient had a 24 [place] three-way Foley catheter placed in his  bladder was irrigated with 2 L of normal saline solution. Clots were evacuated from the  bladder. Patient continues to have bleeding. Discussed with Dr. Tresa Moore, will see patient in ER.  I personally performed the services described in this documentation, which was scribed in my presence. The recorded information has been reviewed and is accurate.   Allen Greek, MD 06/10/15 [phone]

## 2015-06-10 NOTE — Progress Notes (Signed)
Jessica on 4 W, notified pt will be returning to 1445 in 20 minutes

## 2015-06-10 NOTE — Op Note (Signed)
[name], [name]               ACCOUNT NO.:  [id number]  MEDICAL RECORD NO.:  [id number]  LOCATION:  [facility]                         FACILITY:  [facility]  PHYSICIAN:  Allen Cork. Jeffie [name], M.D.    DATE OF BIRTH:  27-May-1932  DATE OF PROCEDURE:  06/10/2015 DATE OF DISCHARGE:                              OPERATIVE REPORT   PROCEDURE: 1. Cystoscopy with evacuation of clots. 2. Transurethral electrovaporization of the prostate.  PREOPERATIVE DIAGNOSIS:  Gross hematuria with clot retention following urolith procedure.  POSTOPERATIVE DIAGNOSIS:  Gross hematuria with clot retention following urolith procedure.  SURGEON:  Allen Cork. Jeffie Pollock, MD  ANESTHESIA:  General.  SPECIMEN:  None.  DRAINS:  A 22-French, 3-way Foley catheter.  BLOOD LOSS:  Approximately 50 mL of old clot.  COMPLICATIONS:  None.  INDICATIONS:  [name] is an 82 year old, white male, who underwent urolith procedure several days ago.  He has had intermittent bleeding and retention since then and it was felt that he needed cystoscopy with fulguration of bleeders.  FINDINGS OF PROCEDURE:  He was given Cipro.  He was taken to the operating room where general anesthetic was induced after an attempted spinal was unsuccessful.  He was placed in lithotomy position and fitted with PAS hose.  His perineum and genitalia were prepped with Betadine solution and draped in usual sterile fashion.  Cystoscopy was performed using a 21-French scope and 30-degree lens. Examination revealed a normal urethra.  The external sphincter was intact.  The prostatic urethra was short with a patent lumen.  There was noted to be some bleeding from the left anterolateral prostate in the area of one of the urolith tacks at least that was an initial location of bleeding.  Inspection of bladder revealed large amount of clot in the bladder.  This was evacuated out and the bladder itself was rather erythematous from the catheterization and irrigation.   He had moderate- to-severe trabeculation with cellules.  Ureteral orifices were unremarkable.  The bladder was evacuated free of clots and the prostatic urethra was then inspected again a [place]bee electrode with water as the irrigant was used initially to attempt fulguration around the urolith tack.  However, he had more areas of bleeding occur as the fulguration progressed.  At this point, I switched to a 28-French continuous flow resectoscope sheath with an [place] handle button electrode.  The irrigant was changed to saline, 30 degree lens was used again.  At this point, a more generous fulguration of the prostate with some electrovaporization at the bladder neck to aid voiding was performed. The urolith clip was identified and it appeared to be leveling out into the lumen possibly causing mucosal injury, so this was cut loose using the button electrode and evacuated from the bladder; however it was lost in the ball of clot and the drape could not be retrieved.  Once this was done, additional fulguration was performed circumferentially because he tended to bleed easily at the margins of the fulguration.  However, once I had completely fulgurated the prostatic urethra with additional electrovaporization as needed, the prostatic urethra was widely patent and no active bleeding was noted. The scope was removed.  Pressure on the bladder  produced an excellent stream of clear irrigant.  A 22-French 3-way Foley catheter was inserted.  The balloon was filled with 30 mL of sterile fluid.  The catheter was irrigated with clear return and placed to continuous irrigation and straight drainage.  The patient was taken down from lithotomy position.  His anesthetic was reversed.  He was moved to recovery in stable condition.  There were no complications.     Allen Cork. Jeffie [name], M.D.     JJW/MEDQ  D:  06/10/2015  T:  06/10/2015  Job:  [id number]

## 2015-06-10 NOTE — Brief Op Note (Signed)
06/10/2015  1:21 PM  PATIENT:  [name]  82 y.o. male  PRE-OPERATIVE DIAGNOSIS:  bleeding  POST-OPERATIVE DIAGNOSIS:  bleeding  PROCEDURE:  Procedure(s): CYSTOSCOPY FULGRATION OF BLEEDING,  electovapor resection (N/A)  SURGEON:  Surgeon(s) and Role:    * Irine Seal, MD - Primary  PHYSICIAN ASSISTANT:   ASSISTANTS: none   ANESTHESIA:   general  EBL:  Total I/O In: 900 [I.V.:900] Out: 2925 [Urine:2900; Blood:25]  BLOOD ADMINISTERED:none  DRAINS: Urinary Catheter (Foley)   LOCAL MEDICATIONS USED:  NONE  SPECIMEN:  No Specimen  DISPOSITION OF SPECIMEN:  N/A  COUNTS:  YES  TOURNIQUET:  * No tourniquets in log *  DICTATION: .Other Dictation: Dictation Number [id number]  PLAN OF CARE: Admit for overnight observation  PATIENT DISPOSITION:  PACU - hemodynamically stable.   Delay start of Pharmacological VTE agent (>24hrs) due to surgical blood loss or risk of bleeding: not applicable

## 2015-06-10 NOTE — ED Notes (Signed)
Pt had prostate surgery 9 days ago has had hematuria since that time.  Pt has been unable to void since 2230 last night.

## 2015-06-10 NOTE — Transfer of Care (Signed)
Immediate Anesthesia Transfer of Care Note  Patient: [name]  Procedure(s) Performed: Procedure(s): CYSTOSCOPY FULGRATION OF BLEEDING,  electovapor resection (N/A)  Patient Location: PACU  Anesthesia Type:General  Level of Consciousness: Patient easily awoken, sedated, comfortable, cooperative, following commands, responds to stimulation.   Airway & Oxygen Therapy: Patient spontaneously breathing, ventilating well, oxygen via simple oxygen mask.  Post-op Assessment: Report given to PACU RN, vital signs reviewed and stable, moving all extremities.   Post vital signs: Reviewed and stable.  Complications: No apparent anesthesia complications

## 2015-06-10 NOTE — Progress Notes (Signed)
Patient ID: [name], male   DOB: [birth year], 82 y.o.   MRN: [id number]  Doing well post op with clear urine.   BP 126/64 mmHg  Pulse 72  Temp(Src) 97.6 F (36.4 C) (Oral)  Resp 17  Ht 5\' 7"  (1.702 m)  SpO2 97%  I will have the foley removed in the morning.

## 2015-06-10 NOTE — Anesthesia Procedure Notes (Signed)
Procedure Name: LMA Insertion Date/Time: 06/10/2015 12:34 PM Performed by: Deliah Boston Pre-anesthesia Checklist: Patient identified, Emergency Drugs available, Suction available and Patient being monitored Patient Re-evaluated:Patient Re-evaluated prior to inductionOxygen Delivery Method: Circle system utilized Preoxygenation: Pre-oxygenation with 100% oxygen Intubation Type: IV induction Ventilation: Mask ventilation without difficulty LMA: LMA inserted LMA Size: 4.0 Number of attempts: 1 Placement Confirmation: positive ETCO2 and breath sounds checked- equal and bilateral Tube secured with: Tape Dental Injury: Teeth and Oropharynx as per pre-operative assessment

## 2015-06-10 NOTE — Anesthesia Preprocedure Evaluation (Addendum)
Anesthesia Evaluation  Patient identified by MRN, date of birth, ID band Patient awake    Reviewed: Allergy & Precautions, H&P , NPO status , Patient's Chart, lab work & pertinent test results, reviewed documented beta blocker date and time   History of Anesthesia Complications (+) history of anesthetic complications (no issues, but patient prefers spinal due to pulmonary problems)  Airway Mallampati: II  TM Distance: >3 FB Neck ROM: Full    Dental  (+) Teeth Intact, Dental Advisory Given   Pulmonary asthma , pneumonia, COPD,  COPD inhaler,    breath sounds clear to auscultation       Cardiovascular + CAD and + Cardiac Stents   Rhythm:Regular Rate:Normal  CAD, s/p PTCA w/ stent 2008 Currently asymptomatic left main had  a 20% stenosis.  Left anterior descending had 40-50% proximal   stenosis.  Left circumflex had an 80% proximal stenosis.  The  first obtuse marginal artery had a 60% mid stenosis.  The  right  coronary artery  had  a 50-60% proximal stenosis with a 60-70%  mid stenosis  The patient was managed medically.    Neuro/Psych PSYCHIATRIC DISORDERS Depression Pituitary macroadenoma RLS negative neurological ROS     GI/Hepatic Neg liver ROS, PUD, GERD  Medicated,  Endo/Other  Hypothyroidism Thyroid replacement  Renal/GU negative Renal ROS  negative genitourinary   Musculoskeletal  (+) Arthritis , Osteoarthritis,  THR-left   Abdominal   Peds  Hematology  (+) Blood dyscrasia, anemia , Plt 213k   Anesthesia Other Findings   Reproductive/Obstetrics negative OB ROS                            Anesthesia Physical Anesthesia Plan  ASA: III  Anesthesia Plan: Spinal   Post-op Pain Management:    Induction:   Airway Management Planned: Natural Airway  Additional Equipment:   Intra-op Plan:   Post-operative Plan:   Informed Consent: I have reviewed the patients History and  Physical, chart, labs and discussed the procedure including the risks, benefits and alternatives for the proposed anesthesia with the patient or authorized representative who has indicated his/her understanding and acceptance.   Dental advisory given  Plan Discussed with: CRNA, Anesthesiologist and Surgeon  Anesthesia Plan Comments: (Discussed risks and benefits of and differences between spinal and general. Discussed risks of spinal including headache, backache, failure, bleeding, infection, and nerve damage. Patient consents to spinal. Questions answered. Coagulation studies and platelet count acceptable.)       Anesthesia Quick Evaluation

## 2015-06-10 NOTE — Progress Notes (Signed)
Received pt from ED, alert and oriented, CBI intact, red urine noted, oriented to unit, call light placed in reach

## 2015-06-10 NOTE — ED Notes (Signed)
08:58 Pt can go to floor.

## 2015-06-10 NOTE — Progress Notes (Signed)
Received pt from PACU, VSS, no distress noted, call light placed in reach

## 2015-06-11 DIAGNOSIS — N029 Recurrent and persistent hematuria with unspecified morphologic changes: Secondary | ICD-10-CM | POA: Diagnosis not present

## 2015-06-11 DIAGNOSIS — N401 Enlarged prostate with lower urinary tract symptoms: Secondary | ICD-10-CM | POA: Diagnosis not present

## 2015-06-11 DIAGNOSIS — N138 Other obstructive and reflux uropathy: Secondary | ICD-10-CM | POA: Diagnosis not present

## 2015-06-11 DIAGNOSIS — J449 Chronic obstructive pulmonary disease, unspecified: Secondary | ICD-10-CM | POA: Diagnosis not present

## 2015-06-11 DIAGNOSIS — Z8249 Family history of ischemic heart disease and other diseases of the circulatory system: Secondary | ICD-10-CM | POA: Diagnosis not present

## 2015-06-11 DIAGNOSIS — Z79899 Other long term (current) drug therapy: Secondary | ICD-10-CM | POA: Diagnosis not present

## 2015-06-11 DIAGNOSIS — R0789 Other chest pain: Secondary | ICD-10-CM | POA: Diagnosis not present

## 2015-06-11 DIAGNOSIS — D62 Acute posthemorrhagic anemia: Secondary | ICD-10-CM | POA: Diagnosis present

## 2015-06-11 DIAGNOSIS — K219 Gastro-esophageal reflux disease without esophagitis: Secondary | ICD-10-CM | POA: Diagnosis not present

## 2015-06-11 DIAGNOSIS — Z885 Allergy status to narcotic agent status: Secondary | ICD-10-CM | POA: Diagnosis not present

## 2015-06-11 DIAGNOSIS — Z7952 Long term (current) use of systemic steroids: Secondary | ICD-10-CM | POA: Diagnosis not present

## 2015-06-11 DIAGNOSIS — R338 Other retention of urine: Secondary | ICD-10-CM | POA: Diagnosis not present

## 2015-06-11 DIAGNOSIS — R0602 Shortness of breath: Secondary | ICD-10-CM | POA: Diagnosis not present

## 2015-06-11 DIAGNOSIS — E039 Hypothyroidism, unspecified: Secondary | ICD-10-CM | POA: Diagnosis not present

## 2015-06-11 DIAGNOSIS — D352 Benign neoplasm of pituitary gland: Secondary | ICD-10-CM | POA: Diagnosis not present

## 2015-06-11 DIAGNOSIS — J45909 Unspecified asthma, uncomplicated: Secondary | ICD-10-CM | POA: Diagnosis not present

## 2015-06-11 DIAGNOSIS — Z888 Allergy status to other drugs, medicaments and biological substances status: Secondary | ICD-10-CM | POA: Diagnosis not present

## 2015-06-11 DIAGNOSIS — N3289 Other specified disorders of bladder: Secondary | ICD-10-CM | POA: Diagnosis not present

## 2015-06-11 DIAGNOSIS — G2581 Restless legs syndrome: Secondary | ICD-10-CM | POA: Diagnosis not present

## 2015-06-11 DIAGNOSIS — Z880 Allergy status to penicillin: Secondary | ICD-10-CM | POA: Diagnosis not present

## 2015-06-11 DIAGNOSIS — I2510 Atherosclerotic heart disease of native coronary artery without angina pectoris: Secondary | ICD-10-CM | POA: Diagnosis not present

## 2015-06-11 DIAGNOSIS — Z96643 Presence of artificial hip joint, bilateral: Secondary | ICD-10-CM | POA: Diagnosis not present

## 2015-06-11 NOTE — Discharge Summary (Signed)
Physician Discharge Summary  Patient ID: [name] MRN: [id number] DOB/AGE: 80-Mar-1934 80 y.o.  Admit date: 06/10/2015 Discharge date: 06/11/2015  Admission Diagnoses:  Clot retention of urine  Discharge Diagnoses:  Principal Problem:   Clot retention of urine Active Problems:   Hematuria   BPH (benign prostatic hypertrophy) with urinary obstruction   Postoperative anemia due to acute blood loss   Past Medical History  Diagnosis Date  . Hypothyroidism   . Anemia   . Blood transfusion june 2011, 5 units given    july 2011 some units given  . Enlarged prostate     elevated psa recently  . GERD (gastroesophageal reflux disease)   . Arthritis   . Dizziness     occasional  . Mass, scrotum     L scrotum - Sees Dr. Jeffie Pollock  . Abnormality of gait 06/07/2013  . Pituitary macroadenoma ([place])   . Restless legs syndrome (RLS)   . Asthma   . Cataract     bilateral removal of cateracts  . Depression   . Complication of anesthesia      no issues,but pt prefers spinal due to Pulmonary problems  . CAD (coronary artery disease)     left main had  a 20% stenosis.  Left anterior descending had 40-50% proximal   stenosis.  Left circumflex had an 80% proximal stenosis.  The  first obtuse marginal artery had a 60% mid stenosis.  The  right  coronary artery  had  a 50-60% proximal stenosis with a 60-70%  mid stenosis  The patient was managed medically. -Dr. Percival Spanish follows -last visit 7'16.   . Pneumonia     No recent.  . Emphysema   . COPD (chronic obstructive pulmonary disease) ([place])     oxygen  on standby in home.  . Enlarged prostate with urinary retention   . Transfusion history     history 4 yrs ago.     Surgeries: Procedure(s): CYSTOSCOPY FULGRATION OF BLEEDING,  electovapor resection on 06/10/2015   Consultants (if any):    Discharged Condition: Improved  Hospital Course: [name] is an 82 y.o. male who was admitted 06/10/2015 with a diagnosis of Clot retention of  urine and went to the operating room on 06/10/2015 and underwent the above named procedures.  He had previously had a Urolift procedure and had recurrent hematuria with clot retention and required readmission yesterday for active bleeding.  He had a mild anemia at 11.2.  His urine remains clear after the procedure.  The foley was removed about 2 hours prior to now but he has no urge to void yet.   He will be discharged home when voiding.     He was given perioperative antibiotics:      Anti-infectives    Start     Dose/Rate Route Frequency Ordered Stop   06/10/15 1120  ciprofloxacin (CIPRO) IVPB 400 mg     400 mg 200 mL/hr over 60 Minutes Intravenous 60 min pre-op 06/10/15 1120 06/10/15 1223   06/10/15 1000  sulfamethoxazole-trimethoprim (BACTRIM DS,SEPTRA DS) 800-160 MG per tablet 1 tablet     1 tablet Oral Every 12 hours 06/10/15 0809      .  He was given sequential compression devices for DVT prophylaxis.  He benefited maximally from the hospital stay and there were no complications.    Recent vital signs:  Filed Vitals:   06/11/15 0326 06/11/15 0623  BP: 94/46 107/50  Pulse: 63 70  Temp: 98 F (  36.7 C) 97.6 F (36.4 C)  Resp: 18 18    Recent laboratory studies:  Lab Results  Component Value Date   HGB 11.5* 06/10/2015   HGB 13.3 05/28/2015   HGB 13.0* 05/15/2014   Lab Results  Component Value Date   WBC 9.9 06/10/2015   PLT 213 06/10/2015   Lab Results  Component Value Date   INR 1.20 04/18/2013   Lab Results  Component Value Date   NA 139 06/10/2015   K 4.0 06/10/2015   CL 108 06/10/2015   CO2 23 06/10/2015   BUN 14 06/10/2015   CREATININE 1.04 06/10/2015   GLUCOSE 97 06/10/2015    Discharge Medications:     Medication List    TAKE these medications        albuterol 108 (90 Base) MCG/ACT inhaler  Commonly known as:  PROVENTIL HFA;VENTOLIN HFA  Inhale 2 puffs into the lungs every 6 (six) hours as needed for wheezing or shortness of breath.      alfuzosin 10 MG 24 hr tablet  Commonly known as:  UROXATRAL  Take 10 mg by mouth daily with breakfast.     bromocriptine 2.5 MG tablet  Commonly known as:  PARLODEL  TAKE  (1)  TABLET TWICE A DAY.     calcium carbonate 600 MG Tabs tablet  Commonly known as:  OS-CAL  Take 600 mg by mouth 2 (two) times daily with a meal.     cholecalciferol 1000 units tablet  Commonly known as:  VITAMIN D  Take 1,000 Units by mouth 2 (two) times daily.     ciprofloxacin 250 MG tablet  Commonly known as:  CIPRO  One po bid x 7 days     cyanocobalamin 100 MCG tablet  Take 100 mcg by mouth daily.     ferrous sulfate 325 (65 FE) MG tablet  Take 325 mg by mouth daily.     guaiFENesin 600 MG 12 hr tablet  Commonly known as:  MUCINEX  Take 600 mg by mouth 2 (two) times daily.     levothyroxine 50 MCG tablet  Commonly known as:  SYNTHROID, LEVOTHROID  TAKE 1 TABLET IN MORNING     magnesium oxide 400 MG tablet  Commonly known as:  MAG-OX  Take 400 mg by mouth daily.     mineral oil enema  Place 1 enema rectally once as needed for mild constipation, moderate constipation or severe constipation.     montelukast 10 MG tablet  Commonly known as:  SINGULAIR  TAKE 1 TABLET DAILY     nitroGLYCERIN 0.4 MG SL tablet  Commonly known as:  NITROSTAT  Place 0.4 mg under the tongue every 5 (five) minutes as needed for chest pain.     pantoprazole 40 MG tablet  Commonly known as:  PROTONIX  Take 1 tablet (40 mg total) by mouth 2 (two) times daily before a meal .     polyethylene glycol packet  Commonly known as:  MIRALAX / GLYCOLAX  Take 17 g by mouth daily.     sucralfate 1 g tablet  Commonly known as:  CARAFATE  Take 1 tablet (1 g total) by mouth 4 (four) times daily -  with meals and at bedtime. As needed     SYMBICORT 160-4.5 MCG/ACT inhaler  Generic drug:  budesonide-formoterol  Inhale 2 puffs into the lungs 2 (two) times daily.     traMADol-acetaminophen 37.5-325 MG tablet  Commonly  known as:  ULTRACET  Take 1 tablet  by mouth every 6 (six) hours as needed.     VISINE 0.05 % ophthalmic solution  Generic drug:  tetrahydrozoline  Place 1 drop into both eyes as needed (For dry eyes).     vitamin E 400 UNIT capsule  Generic drug:  vitamin E  Take 400 Units by mouth daily.        Diagnostic Studies: No results found.  Disposition: 01-Home or Self Care  Discharge Instructions    Discontinue IV    Complete by:  As directed            Follow-up Information    Follow up with Malka So, MD On 06/19/2015.   Specialty:  Urology   Why:  as scheduled   Contact information:   [address] [place] [address] [phone]        Signed: Malka So 06/11/2015, 8:09 AM

## 2015-06-11 NOTE — Discharge Instructions (Signed)
Transurethral Resection of the Prostate, Care After °Refer to this sheet in the next few weeks. These instructions provide you with information on caring for yourself after your procedure. Your caregiver also may give you specific instructions. Your treatment has been planned according to current medical practices, but complications sometimes occur. Call your caregiver if you have any problems or questions after your procedure. °HOME CARE INSTRUCTIONS  °Recovery can take 4-6 weeks. Avoid alcohol, caffeinated drinks, and spicy foods for 2 weeks after your procedure. Drink enough fluids to keep your urine clear or pale yellow. Urinate as soon as you feel the urge to do so. Do not try to hold your urine for long periods of time. °During recovery you may experience pain caused by bladder spasms, which result in a very intense urge to urinate. Take all medicines as directed by your caregiver, including medicines for pain. Try to limit the amount of pain medicines you take because it can cause constipation. If you do become constipated, do not strain to move your bowels. Straining can increase bleeding. Constipation can be minimized by increasing the amount fluids and fiber in your diet. Your caregiver also may prescribe a stool softener. °Do not lift heavy objects (more than 5 lb [2.25 kg]) or perform exercises that cause you to strain for at least 1 month after your procedure. When sitting, you may want to sit in a soft chair or use a cushion. For the first 10 days after your procedure, avoid the following activities: °· Running. °· Strenuous work. °· Long walks. °· Riding in a car for extended periods. °· Sex. °SEEK MEDICAL CARE IF: °· You have difficulty urinating. °· You have blood in your urine that does not go away after you rest or increase your fluid intake. °· You have swelling in your penis or scrotum. °SEEK IMMEDIATE MEDICAL CARE IF:  °· You are suddenly unable to urinate. °· You notice blood clots in your  urine. °· You have chills. °· You have a fever. °· You have pain in your back or lower abdomen. °· You have pain or swelling in your legs. °MAKE SURE YOU:  °· Understand these instructions. °· Will watch your condition. °· Will get help right away if you are not doing well or get worse. °  °This information is not intended to replace advice given to you by your health care provider. Make sure you discuss any questions you have with your health care provider. °  °Document Released: 02/14/2005 Document Revised: 03/07/2014 Document Reviewed: 03/25/2011 °Elsevier Interactive Patient Education ©2016 [employer] ° °

## 2015-06-11 NOTE — Progress Notes (Signed)
Code 44 given to pt.

## 2015-06-12 ENCOUNTER — Encounter (HOSPITAL_COMMUNITY): Payer: Self-pay | Admitting: Emergency Medicine

## 2015-06-12 ENCOUNTER — Emergency Department (HOSPITAL_COMMUNITY): Payer: Medicare Other

## 2015-06-12 ENCOUNTER — Observation Stay (HOSPITAL_COMMUNITY)
Admission: EM | Admit: 2015-06-12 | Discharge: 2015-06-15 | Disposition: A | Discharge status: Home Health | Payer: Medicare Other | Attending: Internal Medicine | Admitting: Internal Medicine

## 2015-06-12 DIAGNOSIS — E034 Atrophy of thyroid (acquired): Secondary | ICD-10-CM

## 2015-06-12 DIAGNOSIS — I2510 Atherosclerotic heart disease of native coronary artery without angina pectoris: Secondary | ICD-10-CM | POA: Diagnosis not present

## 2015-06-12 DIAGNOSIS — R918 Other nonspecific abnormal finding of lung field: Secondary | ICD-10-CM | POA: Diagnosis not present

## 2015-06-12 DIAGNOSIS — Z955 Presence of coronary angioplasty implant and graft: Secondary | ICD-10-CM | POA: Insufficient documentation

## 2015-06-12 DIAGNOSIS — R002 Palpitations: Secondary | ICD-10-CM | POA: Diagnosis not present

## 2015-06-12 DIAGNOSIS — N401 Enlarged prostate with lower urinary tract symptoms: Secondary | ICD-10-CM | POA: Diagnosis not present

## 2015-06-12 DIAGNOSIS — J189 Pneumonia, unspecified organism: Secondary | ICD-10-CM | POA: Diagnosis not present

## 2015-06-12 DIAGNOSIS — D72829 Elevated white blood cell count, unspecified: Secondary | ICD-10-CM

## 2015-06-12 DIAGNOSIS — R262 Difficulty in walking, not elsewhere classified: Secondary | ICD-10-CM | POA: Insufficient documentation

## 2015-06-12 DIAGNOSIS — J439 Emphysema, unspecified: Secondary | ICD-10-CM | POA: Diagnosis not present

## 2015-06-12 DIAGNOSIS — D539 Nutritional anemia, unspecified: Secondary | ICD-10-CM | POA: Diagnosis present

## 2015-06-12 DIAGNOSIS — E876 Hypokalemia: Secondary | ICD-10-CM | POA: Diagnosis not present

## 2015-06-12 DIAGNOSIS — I4891 Unspecified atrial fibrillation: Secondary | ICD-10-CM | POA: Diagnosis not present

## 2015-06-12 DIAGNOSIS — I480 Paroxysmal atrial fibrillation: Principal | ICD-10-CM | POA: Diagnosis present

## 2015-06-12 DIAGNOSIS — R778 Other specified abnormalities of plasma proteins: Secondary | ICD-10-CM

## 2015-06-12 DIAGNOSIS — Z880 Allergy status to penicillin: Secondary | ICD-10-CM | POA: Diagnosis not present

## 2015-06-12 DIAGNOSIS — R319 Hematuria, unspecified: Secondary | ICD-10-CM | POA: Diagnosis not present

## 2015-06-12 DIAGNOSIS — E039 Hypothyroidism, unspecified: Secondary | ICD-10-CM | POA: Diagnosis not present

## 2015-06-12 DIAGNOSIS — E038 Other specified hypothyroidism: Secondary | ICD-10-CM

## 2015-06-12 DIAGNOSIS — D649 Anemia, unspecified: Secondary | ICD-10-CM | POA: Diagnosis not present

## 2015-06-12 DIAGNOSIS — J449 Chronic obstructive pulmonary disease, unspecified: Secondary | ICD-10-CM

## 2015-06-12 DIAGNOSIS — Z96643 Presence of artificial hip joint, bilateral: Secondary | ICD-10-CM | POA: Insufficient documentation

## 2015-06-12 DIAGNOSIS — R7989 Other specified abnormal findings of blood chemistry: Secondary | ICD-10-CM | POA: Diagnosis not present

## 2015-06-12 HISTORY — DX: Gastrointestinal hemorrhage, unspecified: K92.2

## 2015-06-12 LAB — CBC WITH DIFFERENTIAL/PLATELET
Basophils Absolute: 0.1 10*3/uL (ref 0.0–0.1)
Basophils Relative: 1 %
Eosinophils Absolute: 0.3 10*3/uL (ref 0.0–0.7)
Eosinophils Relative: 2 %
HCT: 33 % — ABNORMAL LOW (ref 39.0–52.0)
Hemoglobin: 10.9 g/dL — ABNORMAL LOW (ref 13.0–17.0)
Lymphocytes Relative: 7 %
Lymphs Abs: 1 10*3/uL (ref 0.7–4.0)
MCH: 30.6 pg (ref 26.0–34.0)
MCHC: 33 g/dL (ref 30.0–36.0)
MCV: 92.7 fL (ref 78.0–100.0)
Monocytes Absolute: 1.5 10*3/uL — ABNORMAL HIGH (ref 0.1–1.0)
Monocytes Relative: 10 %
Neutro Abs: 11.8 10*3/uL — ABNORMAL HIGH (ref 1.7–7.7)
Neutrophils Relative %: 80 %
Platelets: 191 10*3/uL (ref 150–400)
RBC: 3.56 MIL/uL — ABNORMAL LOW (ref 4.22–5.81)
RDW: 14.3 % (ref 11.5–15.5)
WBC: 14.7 10*3/uL — ABNORMAL HIGH (ref 4.0–10.5)

## 2015-06-12 LAB — URINALYSIS, ROUTINE W REFLEX MICROSCOPIC
Bilirubin Urine: NEGATIVE
Glucose, UA: NEGATIVE mg/dL
Ketones, ur: 15 mg/dL — AB
Leukocytes, UA: NEGATIVE
Nitrite: NEGATIVE
Protein, ur: NEGATIVE mg/dL
Specific Gravity, Urine: 1.007 (ref 1.005–1.030)
pH: 5.5 (ref 5.0–8.0)

## 2015-06-12 LAB — I-STAT CHEM 8, ED
BUN: 13 mg/dL (ref 6–20)
Calcium, Ion: 1.1 mmol/L — ABNORMAL LOW (ref 1.13–1.30)
Chloride: 105 mmol/L (ref 101–111)
Creatinine, Ser: 1.1 mg/dL (ref 0.61–1.24)
Glucose, Bld: 84 mg/dL (ref 65–99)
HCT: 35 % — ABNORMAL LOW (ref 39.0–52.0)
Hemoglobin: 11.9 g/dL — ABNORMAL LOW (ref 13.0–17.0)
Potassium: 3.4 mmol/L — ABNORMAL LOW (ref 3.5–5.1)
Sodium: 138 mmol/L (ref 135–145)
TCO2: 18 mmol/L (ref 0–100)

## 2015-06-12 LAB — TROPONIN I
Troponin I: 0.43 ng/mL — ABNORMAL HIGH (ref <0.031)
Troponin I: 0.52 ng/mL (ref <0.031)
Troponin I: 0.57 ng/mL (ref <0.031)

## 2015-06-12 LAB — BRAIN NATRIURETIC PEPTIDE: B Natriuretic Peptide: 224.9 pg/mL — ABNORMAL HIGH (ref 0.0–100.0)

## 2015-06-12 LAB — MRSA PCR SCREENING: MRSA by PCR: NEGATIVE

## 2015-06-12 LAB — LIPID PANEL
Cholesterol: 121 mg/dL (ref 0–200)
HDL: 42 mg/dL (ref >40)
LDL Cholesterol: 70 mg/dL (ref 0–99)
Total CHOL/HDL Ratio: 2.9 RATIO
Triglycerides: 45 mg/dL (ref <150)
VLDL: 9 mg/dL (ref 0–40)

## 2015-06-12 LAB — URINE MICROSCOPIC-ADD ON

## 2015-06-12 LAB — MAGNESIUM: Magnesium: 1.9 mg/dL (ref 1.7–2.4)

## 2015-06-12 LAB — I-STAT TROPONIN, ED: Troponin i, poc: 0 ng/mL (ref 0.00–0.08)

## 2015-06-12 LAB — TSH: TSH: 4.16 u[IU]/mL (ref 0.350–4.500)

## 2015-06-12 LAB — STREP PNEUMONIAE URINARY ANTIGEN: Strep Pneumo Urinary Antigen: NEGATIVE

## 2015-06-12 LAB — PROCALCITONIN: Procalcitonin: 0.99 ng/mL

## 2015-06-12 MED ORDER — MONTELUKAST SODIUM 10 MG PO TABS
10.0000 mg | ORAL_TABLET | Freq: Every day | ORAL | Status: DC
  Administered 2015-06-12 – 2015-06-15 (×4): 10 mg via ORAL
  Filled 2015-06-12 (×4): qty 1

## 2015-06-12 MED ORDER — PANTOPRAZOLE SODIUM 40 MG PO TBEC
40.0000 mg | DELAYED_RELEASE_TABLET | Freq: Two times a day (BID) | ORAL | Status: DC
  Administered 2015-06-12 – 2015-06-15 (×7): 40 mg via ORAL
  Filled 2015-06-12 (×7): qty 1

## 2015-06-12 MED ORDER — SUCRALFATE 1 G PO TABS
1.0000 g | ORAL_TABLET | Freq: Every day | ORAL | Status: DC
  Administered 2015-06-12 – 2015-06-14 (×3): 1 g via ORAL
  Filled 2015-06-12 (×3): qty 1

## 2015-06-12 MED ORDER — DILTIAZEM HCL 100 MG IV SOLR
INTRAVENOUS | Status: AC
  Administered 2015-06-12: 5 mg/h via INTRAVENOUS
  Filled 2015-06-12: qty 100

## 2015-06-12 MED ORDER — ASPIRIN EC 81 MG PO TBEC
81.0000 mg | DELAYED_RELEASE_TABLET | Freq: Every day | ORAL | Status: DC
Start: 2015-06-12 — End: 2015-06-15
  Administered 2015-06-13 – 2015-06-15 (×3): 81 mg via ORAL
  Filled 2015-06-12 (×4): qty 1

## 2015-06-12 MED ORDER — ENOXAPARIN SODIUM 80 MG/0.8ML ~~LOC~~ SOLN
1.0000 mg/kg | Freq: Two times a day (BID) | SUBCUTANEOUS | Status: DC
  Filled 2015-06-12: qty 0.8

## 2015-06-12 MED ORDER — VITAMIN D 1000 UNITS PO TABS
1000.0000 [IU] | ORAL_TABLET | Freq: Two times a day (BID) | ORAL | Status: DC
  Administered 2015-06-12 – 2015-06-15 (×7): 1000 [IU] via ORAL
  Filled 2015-06-12 (×8): qty 1

## 2015-06-12 MED ORDER — DILTIAZEM HCL 100 MG IV SOLR
INTRAVENOUS | Status: AC
  Filled 2015-06-12: qty 100

## 2015-06-12 MED ORDER — DILTIAZEM HCL 100 MG IV SOLR
5.0000 mg/h | Freq: Once | INTRAVENOUS | Status: AC
  Administered 2015-06-12: 15 mg/h via INTRAVENOUS

## 2015-06-12 MED ORDER — ONDANSETRON HCL 4 MG/2ML IJ SOLN
4.0000 mg | Freq: Four times a day (QID) | INTRAMUSCULAR | Status: DC | PRN
  Administered 2015-06-13: 4 mg via INTRAVENOUS
  Filled 2015-06-12: qty 2

## 2015-06-12 MED ORDER — DILTIAZEM HCL 30 MG PO TABS
30.0000 mg | ORAL_TABLET | Freq: Four times a day (QID) | ORAL | Status: DC
  Administered 2015-06-12 – 2015-06-13 (×4): 30 mg via ORAL
  Filled 2015-06-12 (×4): qty 1

## 2015-06-12 MED ORDER — GUAIFENESIN ER 600 MG PO TB12
600.0000 mg | ORAL_TABLET | Freq: Two times a day (BID) | ORAL | Status: DC
  Administered 2015-06-12 – 2015-06-15 (×7): 600 mg via ORAL
  Filled 2015-06-12 (×7): qty 1

## 2015-06-12 MED ORDER — CALCIUM CARBONATE 1250 (500 CA) MG PO TABS
1.0000 | ORAL_TABLET | Freq: Two times a day (BID) | ORAL | Status: DC
  Administered 2015-06-12 – 2015-06-15 (×7): 500 mg via ORAL
  Filled 2015-06-12 (×6): qty 1

## 2015-06-12 MED ORDER — BUDESONIDE 0.5 MG/2ML IN SUSP
0.5000 mg | Freq: Two times a day (BID) | RESPIRATORY_TRACT | Status: DC
Start: 2015-06-12 — End: 2015-06-15
  Administered 2015-06-12 – 2015-06-15 (×6): 0.5 mg via RESPIRATORY_TRACT
  Filled 2015-06-12 (×7): qty 2

## 2015-06-12 MED ORDER — DILTIAZEM HCL 100 MG IV SOLR: 5.0000 mg/h | Freq: Once | INTRAVENOUS | Status: DC

## 2015-06-12 MED ORDER — VITAMIN B-12 100 MCG PO TABS
100.0000 ug | ORAL_TABLET | Freq: Every day | ORAL | Status: DC
  Administered 2015-06-12 – 2015-06-15 (×4): 100 ug via ORAL
  Filled 2015-06-12 (×4): qty 1

## 2015-06-12 MED ORDER — LEVOFLOXACIN IN D5W 750 MG/150ML IV SOLN: 750.0000 mg | INTRAVENOUS | Status: DC

## 2015-06-12 MED ORDER — POTASSIUM CHLORIDE CRYS ER 20 MEQ PO TBCR
20.0000 meq | EXTENDED_RELEASE_TABLET | Freq: Once | ORAL | Status: AC
  Administered 2015-06-12: 20 meq via ORAL
  Filled 2015-06-12: qty 1

## 2015-06-12 MED ORDER — MAGNESIUM OXIDE 400 (241.3 MG) MG PO TABS
400.0000 mg | ORAL_TABLET | Freq: Every day | ORAL | Status: DC
  Administered 2015-06-12 – 2015-06-15 (×4): 400 mg via ORAL
  Filled 2015-06-12 (×4): qty 1

## 2015-06-12 MED ORDER — LEVOFLOXACIN IN D5W 750 MG/150ML IV SOLN
750.0000 mg | INTRAVENOUS | Status: DC
  Administered 2015-06-13 – 2015-06-14 (×2): 750 mg via INTRAVENOUS
  Filled 2015-06-12 (×2): qty 150

## 2015-06-12 MED ORDER — DOCUSATE SODIUM 100 MG PO CAPS
100.0000 mg | ORAL_CAPSULE | Freq: Two times a day (BID) | ORAL | Status: DC | PRN
  Administered 2015-06-13: 100 mg via ORAL
  Filled 2015-06-12: qty 1

## 2015-06-12 MED ORDER — NAPHAZOLINE-GLYCERIN 0.012-0.2 % OP SOLN
1.0000 [drp] | Freq: Four times a day (QID) | OPHTHALMIC | Status: DC | PRN
  Administered 2015-06-15: 1 [drp] via OPHTHALMIC
  Filled 2015-06-12 (×2): qty 15

## 2015-06-12 MED ORDER — LEVOTHYROXINE SODIUM 50 MCG PO TABS
50.0000 ug | ORAL_TABLET | Freq: Every day | ORAL | Status: DC
  Administered 2015-06-12 – 2015-06-15 (×4): 50 ug via ORAL
  Filled 2015-06-12 (×4): qty 1

## 2015-06-12 MED ORDER — CALCIUM CARBONATE 600 MG PO TABS: 600.0000 mg | ORAL_TABLET | Freq: Two times a day (BID) | ORAL | Status: DC

## 2015-06-12 MED ORDER — SODIUM CHLORIDE 0.9 % IV SOLN
Freq: Once | INTRAVENOUS | Status: AC
  Administered 2015-06-12: 01:00:00 via INTRAVENOUS

## 2015-06-12 MED ORDER — ACETAMINOPHEN 325 MG PO TABS: 650.0000 mg | ORAL_TABLET | ORAL | Status: DC | PRN

## 2015-06-12 MED ORDER — ARFORMOTEROL TARTRATE 15 MCG/2ML IN NEBU
15.0000 ug | INHALATION_SOLUTION | Freq: Two times a day (BID) | RESPIRATORY_TRACT | Status: DC
  Administered 2015-06-12 – 2015-06-15 (×6): 15 ug via RESPIRATORY_TRACT
  Filled 2015-06-12 (×7): qty 2

## 2015-06-12 MED ORDER — MINERAL OIL RE ENEM
1.0000 | ENEMA | Freq: Once | RECTAL | Status: DC | PRN
  Filled 2015-06-12: qty 1

## 2015-06-12 MED ORDER — POTASSIUM CHLORIDE CRYS ER 20 MEQ PO TBCR: 20.0000 meq | EXTENDED_RELEASE_TABLET | Freq: Once | ORAL | Status: DC

## 2015-06-12 MED ORDER — FERROUS SULFATE 325 (65 FE) MG PO TABS
325.0000 mg | ORAL_TABLET | Freq: Every day | ORAL | Status: DC
  Administered 2015-06-12 – 2015-06-15 (×4): 325 mg via ORAL
  Filled 2015-06-12 (×4): qty 1

## 2015-06-12 MED ORDER — IPRATROPIUM-ALBUTEROL 0.5-2.5 (3) MG/3ML IN SOLN
3.0000 mL | Freq: Three times a day (TID) | RESPIRATORY_TRACT | Status: DC
  Administered 2015-06-12 – 2015-06-13 (×4): 3 mL via RESPIRATORY_TRACT
  Filled 2015-06-12 (×4): qty 3

## 2015-06-12 MED ORDER — ALFUZOSIN HCL ER 10 MG PO TB24
10.0000 mg | ORAL_TABLET | Freq: Every day | ORAL | Status: DC
  Administered 2015-06-12 – 2015-06-15 (×4): 10 mg via ORAL
  Filled 2015-06-12 (×5): qty 1

## 2015-06-12 MED ORDER — BROMOCRIPTINE MESYLATE 2.5 MG PO TABS
2.5000 mg | ORAL_TABLET | Freq: Two times a day (BID) | ORAL | Status: DC
  Administered 2015-06-12 – 2015-06-15 (×7): 2.5 mg via ORAL
  Filled 2015-06-12 (×7): qty 1

## 2015-06-12 MED ORDER — POLYETHYLENE GLYCOL 3350 17 G PO PACK
17.0000 g | PACK | Freq: Every day | ORAL | Status: DC | PRN
  Administered 2015-06-13: 17 g via ORAL
  Filled 2015-06-12: qty 1

## 2015-06-12 MED ORDER — LEVOFLOXACIN IN D5W 750 MG/150ML IV SOLN
750.0000 mg | Freq: Once | INTRAVENOUS | Status: AC
  Administered 2015-06-12: 750 mg via INTRAVENOUS
  Filled 2015-06-12: qty 150

## 2015-06-12 NOTE — ED Notes (Signed)
Family at bedside. 

## 2015-06-12 NOTE — Progress Notes (Signed)
ANTICOAGULATION CONSULT NOTE - Initial Consult  Pharmacy Consult for Lovenox  Indication: atrial fibrillation  Patient Measurements: Height: 5\' 7"  (170.2 cm) Weight: 164 lb (74.39 kg) IBW/kg (Calculated) : 66.1  Vital Signs: Temp: 97.7 F (36.5 C) (04/14 0022) Temp Source: Oral (04/14 0022) BP: 112/62 mmHg (04/14 0345) Pulse Rate: 84 (04/14 0345)  Labs:  Recent Labs  06/10/15 0525 06/12/15 0033 06/12/15 0040  HGB 11.5* 10.9* 11.9*  HCT 34.1* 33.0* 35.0*  PLT 213 191  --   CREATININE 1.04  --  1.10    Estimated Creatinine Clearance: 50.1 mL/min (by C-G formula based on Cr of 1.1).   Medical History: Past Medical History  Diagnosis Date  . Hypothyroidism   . Anemia   . Blood transfusion june 2011, 5 units given    july 2011 some units given  . Enlarged prostate     elevated psa recently  . GERD (gastroesophageal reflux disease)   . Arthritis   . Dizziness     occasional  . Mass, scrotum     L scrotum - Sees Dr. Jeffie Pollock  . Abnormality of gait 06/07/2013  . Pituitary macroadenoma ([place])   . Restless legs syndrome (RLS)   . Asthma   . Cataract     bilateral removal of cateracts  . Depression   . Complication of anesthesia      no issues,but pt prefers spinal due to Pulmonary problems  . CAD (coronary artery disease)     left main had  a 20% stenosis.  Left anterior descending had 40-50% proximal   stenosis.  Left circumflex had an 80% proximal stenosis.  The  first obtuse marginal artery had a 60% mid stenosis.  The  right  coronary artery  had  a 50-60% proximal stenosis with a 60-70%  mid stenosis  The patient was managed medically. -Dr. Percival Spanish follows -last visit 7'16.   . Pneumonia     No recent.  . Emphysema   . COPD (chronic obstructive pulmonary disease) ([place])     oxygen  on standby in home.  . Enlarged prostate with urinary retention   . Transfusion history     history 4 yrs ago.    Assessment: 82 y/o M with recent discharge from [facility] after urology  procedure, pt now with new onset afib, Hgb 11.9, renal function good, other labs reviewed.   Goal of Therapy:  Monitor platelets by anticoagulation protocol: Yes   Plan:  -Lovenox 1 mg/kg subcutaneous q12h -Minimum q72h CBC while inpatient -Monitor closely for bleeding given history   Narda Bonds 06/12/2015,5:05 AM

## 2015-06-12 NOTE — Progress Notes (Signed)
Pt receiving cardiezem drip at 5 ml/hr, HR in 80's, Sinus rhythm upon shift change at 0700. Blood pressure sustaining 90"s/60"s for 4 hours. Dr Doyle Askew called and made aware. Received verbal order to hold drip until cardiologist evaluation. Stopped drip at 1100. Continue to monitor closely.

## 2015-06-12 NOTE — ED Notes (Signed)
Family made aware of bed assignment 

## 2015-06-12 NOTE — H&P (Signed)
[facility] History and Physical  ANNE MANEVAL [id number] DOB: 09/29/1932 DOA: 06/12/2015  Referring physician: ED PCP: Redge Gainer, MD   Chief Complaint: Fluttering  HPI:  [name] is a 82 year old male with past medical history of CAD s/p PCI, hypothyroidism, BPH, COPD/emphysema, and pituitary microadenoma; who presents after acute onset of feeling his heart fluttering around 10 PM last night only to go to bed. He reports being in and out of the hospital for urinary retention recently. On 4/3 underwent a Urolift procedure by Dr. Jeffie Pollock under spinal anesthesia. He was noted to have a urinary tract infection for which they started him on ciprofloxacin, but also had difficulty with urinary retention following the procedure and was sent home with a Foley catheter which was removed following day. However, he started to pass blood clots and was acutely obstructed for which he was seen at [facility] on 4/7 for which a Foley catheter had to be placed and irrigated. He reports going back and forth to the urologist office and then on 4/12 he had to undergo a repeat cystoscopy with spinal to determine the cause of his hematuria and correct the problem. He just was discharged from the hospital yesterday and then presented last night after feeling his heart fluttering. He notes that he's never been in atrial fibrillation before. He did experience some chills yesterday evening, but was also noted to have been on propofol as part of  the anesthesia. He denies having any fever, chills, nausea, vomiting, abdominal pain, hematuria, or sweats. He does report some increase in his symptoms of shortness of breath. He claims to have a chronic cough that may be slightly increased. His weight is somewhere around 162 -164 pounds on average.  Upon admission patient's heart rates were seen to be as high as 175 and initial EKG revealed A. fib with RVR. Patient was placed on a Cardizem drip in the ED and  [facility] consulted to admit.   Review of Systems  Constitutional: Negative for fever and chills.  HENT: Positive for hearing loss. Negative for tinnitus.   Eyes: Negative for photophobia and pain.  Respiratory: Positive for cough, sputum production and shortness of breath. Negative for wheezing.   Cardiovascular: Positive for palpitations. Negative for chest pain.  Gastrointestinal: Negative for nausea, vomiting, abdominal pain and blood in stool.  Genitourinary: Positive for hematuria.  Musculoskeletal: Positive for joint pain. Negative for falls.  Skin: Negative for itching and rash.  Neurological: Negative for speech change and focal weakness.  Endo/Heme/Allergies: Negative for polydipsia.  Psychiatric/Behavioral: Negative for suicidal ideas and substance abuse.        Past Medical History  Diagnosis Date  . Hypothyroidism   . Anemia   . Blood transfusion june 2011, 5 units given    july 2011 some units given  . Enlarged prostate     elevated psa recently  . GERD (gastroesophageal reflux disease)   . Arthritis   . Dizziness     occasional  . Mass, scrotum     L scrotum - Sees Dr. Jeffie Pollock  . Abnormality of gait 06/07/2013  . Pituitary macroadenoma ([place])   . Restless legs syndrome (RLS)   . Asthma   . Cataract     bilateral removal of cateracts  . Depression   . Complication of anesthesia      no issues,but pt prefers spinal due to Pulmonary problems  . CAD (coronary artery disease)     left main had  a  20% stenosis.  Left anterior descending had 40-50% proximal   stenosis.  Left circumflex had an 80% proximal stenosis.  The  first obtuse marginal artery had a 60% mid stenosis.  The  right  coronary artery  had  a 50-60% proximal stenosis with a 60-70%  mid stenosis  The patient was managed medically. -Dr. Percival Spanish follows -last visit 7'16.   . Pneumonia     No recent.  . Emphysema   . COPD (chronic obstructive pulmonary disease) ([place])     oxygen  on standby  in home.  . Enlarged prostate with urinary retention   . Transfusion history     history 4 yrs ago.      Past Surgical History  Procedure Laterality Date  . Coronary stent placement  5 yrs ago  . Hiatal hernia repair  01-04-2008  . Foot surgery  1994 left, 2002 right foot    bilateral foot reconstruciton  . Abdominal hernia repair   2008  . Cataract extraction both eyes    . Total hip arthroplasty  07/21/2011    Procedure: TOTAL HIP ARTHROPLASTY ANTERIOR APPROACH;  Surgeon: Mauri Pole, MD;  Location: [facility];  Service: Orthopedics;  Laterality: Left;  . Coronary angioplasty    . Total hip arthroplasty Right 09/07/2012    Procedure: RIGHT TOTAL HIP ARTHROPLASTY ANTERIOR APPROACH;  Surgeon: Mcarthur Rossetti, MD;  Location: [facility];  Service: Orthopedics;  Laterality: Right;  . Foot surgery      reconstruction of both feet- no retained hardware.  . Esophagogastroduodenoscopy N/A 04/20/2013    Procedure: ESOPHAGOGASTRODUODENOSCOPY (EGD);  Surgeon: Inda Castle, MD;  Location: [facility];  Service: Endoscopy;  Laterality: N/A;  . Colonoscopy    . Upper gastrointestinal endoscopy    . Cystoscopy with insertion of urolift N/A 06/01/2015    Procedure: CYSTOSCOPY WITH INSERTION OF UROLIFT x4;  Surgeon: Irine Seal, MD;  Location: [facility];  Service: Urology;  Laterality: N/A;  . Cystoscopy N/A 06/10/2015    Procedure: CYSTOSCOPY FULGRATION OF BLEEDING,  electovapor resection;  Surgeon: Irine Seal, MD;  Location: [facility];  Service: Urology;  Laterality: N/A;      Social History:  reports that he has never smoked. He has never used smokeless tobacco. He reports that he does not drink alcohol or use illicit drugs.   Allergies  Allergen Reactions  . Betadine [Povidone Iodine]     "Skin blisters"  . Lortab [Hydrocodone-Acetaminophen] Nausea And Vomiting  . Penicillins Other (See Comments)    Light headed Has patient had a PCN reaction causing immediate rash, facial/tongue/throat  swelling, SOB or lightheadedness with hypotension: Yes Has patient had a PCN reaction causing severe rash involving mucus membranes or skin necrosis: No Has patient had a PCN reaction that required hospitalization No Has patient had a PCN reaction occurring within the last 10 years: No If all of the above answers are "NO", then may proceed with Cephalosporin use.  Marland Kitchen Zetia [Ezetimibe] Other (See Comments)    Muscle weakness  . Zocor [Simvastatin] Nausea Only and Other (See Comments)    muscle weakness    Family History  Problem Relation Age of Onset  . Heart disease Sister   . Heart failure Father   . Colon cancer Neg Hx   . Esophageal cancer Neg Hx   . Rectal cancer Neg Hx   . Stomach cancer Neg Hx         Prior to Admission medications   Medication Sig Start  Date End Date Taking? Authorizing Provider  albuterol (PROVENTIL HFA;VENTOLIN HFA) 108 (90 BASE) MCG/ACT inhaler Inhale 2 puffs into the lungs every 6 (six) hours as needed for wheezing or shortness of breath.   Yes Historical Provider, MD  alfuzosin (UROXATRAL) 10 MG 24 hr tablet Take 10 mg by mouth daily with breakfast.   Yes Historical Provider, MD  bromocriptine (PARLODEL) 2.5 MG tablet TAKE  (1)  TABLET TWICE A DAY. Patient taking differently: TAKE 2.5 MG BY MOUTH TWICE A DAY. 04/06/15  Yes Chipper Herb, MD  calcium carbonate (OS-CAL) 600 MG TABS tablet Take 600 mg by mouth 2 (two) times daily with a meal.   Yes Historical Provider, MD  cholecalciferol (VITAMIN D) 1000 UNITS tablet Take 1,000 Units by mouth 2 (two) times daily.   Yes Historical Provider, MD  ciprofloxacin (CIPRO) 250 MG tablet One po bid x 7 days Patient taking differently: Take 250 mg by mouth 2 (two) times daily.  06/05/15  Yes Milton Ferguson, MD  cyanocobalamin 100 MCG tablet Take 100 mcg by mouth daily.   Yes Historical Provider, MD  Docusate Calcium (STOOL SOFTENER PO) Take 1 tablet by mouth 2 (two) times daily as needed (constipation).   Yes Historical  Provider, MD  ferrous sulfate 325 (65 FE) MG tablet Take 325 mg by mouth daily.    Yes Historical Provider, MD  guaiFENesin (MUCINEX) 600 MG 12 hr tablet Take 600 mg by mouth 2 (two) times daily.    Yes Historical Provider, MD  levothyroxine (SYNTHROID, LEVOTHROID) 50 MCG tablet TAKE 1 TABLET IN MORNING Patient taking differently: TAKE 50 MCG BY MOUTH EVER MORNING 06/03/15  Yes Renato Shin, MD  magnesium oxide (MAG-OX) 400 MG tablet Take 400 mg by mouth daily.   Yes Historical Provider, MD  mineral oil enema Place 1 enema rectally once as needed for mild constipation, moderate constipation or severe constipation.   Yes Historical Provider, MD  montelukast (SINGULAIR) 10 MG tablet TAKE 1 TABLET DAILY Patient taking differently: TAKE 1 TABLET DAILY AS NEEDED FOR SEASONAL ALLERGIES   Yes Chipper Herb, MD  nitroGLYCERIN (NITROSTAT) 0.4 MG SL tablet Place 0.4 mg under the tongue every 5 (five) minutes as needed for chest pain.   Yes Historical Provider, MD  pantoprazole (PROTONIX) 40 MG tablet Take 1 tablet (40 mg total) by mouth 2 (two) times daily before a meal . 10/13/14  Yes Lafayette Dragon, MD  polyethylene glycol (MIRALAX / GLYCOLAX) packet Take 17 g by mouth daily. Patient taking differently: Take 17 g by mouth daily as needed for mild constipation.  04/22/13  Yes Hosie Poisson, MD  PRESCRIPTION MEDICATION Place 1 application rectally daily as needed (constipation).   Yes Historical Provider, MD  sucralfate (CARAFATE) 1 g tablet Take 1 tablet (1 g total) by mouth 4 (four) times daily -  with meals and at bedtime. As needed Patient taking differently: Take 1 g by mouth at bedtime. *may take up to 4 times daily* *dissolves tablet* 05/04/15  Yes Jerene Bears, MD  SYMBICORT 160-4.5 MCG/ACT inhaler Inhale 2 puffs into the lungs 2 (two) times daily. 02/16/15  Yes Chipper Herb, MD  tetrahydrozoline (VISINE) 0.05 % ophthalmic solution Place 1 drop into both eyes as needed (For dry eyes).   Yes Historical  Provider, MD  traMADol-acetaminophen (ULTRACET) 37.5-325 MG tablet Take 1 tablet by mouth every 6 (six) hours as needed. Patient taking differently: Take 1 tablet by mouth every 6 (six) hours as needed  for moderate pain or severe pain.  06/01/15  Yes Irine Seal, MD  vitamin E (VITAMIN E) 400 UNIT capsule Take 400 Units by mouth daily.   Yes Historical Provider, MD     Physical Exam: Filed Vitals:   06/12/15 0300 06/12/15 0315 06/12/15 0330 06/12/15 0345  BP: 107/56 100/62 107/64 112/62  Pulse: 98 111 94 84  Temp:      TempSrc:      Resp: 26 32 30 14  Height:      Weight:      SpO2: 96% 98% 98% 96%     Constitutional: Vital signs reviewed. Patient is a elderly male who is well-developed and well-nourished in no acute distress and cooperative with exam. Alert and oriented x3.  Head: Normocephalic and atraumatic  Ear: TM normal bilaterally  Mouth: no erythema or exudates, MMM  Eyes: PERRL, EOMI, conjunctivae normal, No scleral icterus.  Neck: Supple, Trachea midline normal ROM, No JVD, mass, thyromegaly, or carotid bruit present.  Cardiovascular: Irregular irregular and intermittently tachycardic Pulmonary/Chest: Diminished breath sounds with rales noted near the left lung base, and mildly tachypneic Abdominal: Soft. Non-tender, non-distended, bowel sounds are normal, no masses, organomegaly, or guarding present.  GU: no CVA tenderness Musculoskeletal: No joint deformities, erythema, or stiffness, ROM full and no nontender Ext: Trace edema of the feet and no cyanosis, pulses palpable bilaterally (DP and PT)  Hematology: no cervical, inginal, or axillary adenopathy.  Neurological: A&O x3, Strenght is normal and symmetric bilaterally, cranial nerve II-XII are grossly intact, no focal motor deficit, sensory intact to light touch bilaterally.  Skin: Warm, dry and intact. No rash, cyanosis, or clubbing.  Psychiatric: Normal mood and affect. speech and behavior is normal. Judgment and thought  content normal. Cognition and memory are normal.      Data Review   Micro Results Recent Results (from the past 240 hour(s))  Urine culture     Status: None   Collection Time: 06/05/15 10:20 PM  Result Value Ref Range Status   Specimen Description URINE, CLEAN CATCH  Final   Special Requests Normal  Final   Culture MULTIPLE SPECIES PRESENT, SUGGEST RECOLLECTION  Final   Report Status 06/08/2015 FINAL  Final    Radiology Reports Dg Chest Portable 1 View  06/12/2015  CLINICAL DATA:  Acute onset of tachycardia.  Initial encounter. EXAM: PORTABLE CHEST 1 VIEW COMPARISON:  Chest radiograph performed 04/18/2013 FINDINGS: The lungs are well-aerated. Left basilar airspace opacity raises concern for pneumonia. There is no evidence of pleural effusion or pneumothorax. The cardiomediastinal silhouette is borderline enlarged. No acute osseous abnormalities are seen. IMPRESSION: Left basilar airspace opacity raises concern for pneumonia. Borderline cardiomegaly. Electronically Signed   By: Garald Balding M.D.   On: 06/12/2015 02:15     CBC  Recent Labs Lab 06/10/15 0525 06/12/15 0033 06/12/15 0040  WBC 9.9 14.7*  --   HGB 11.5* 10.9* 11.9*  HCT 34.1* 33.0* 35.0*  PLT 213 191  --   MCV 92.2 92.7  --   MCH 31.1 30.6  --   MCHC 33.7 33.0  --   RDW 14.1 14.3  --   LYMPHSABS 1.1 1.0  --   MONOABS 1.1* 1.5*  --   EOSABS 0.2 0.3  --   BASOSABS 0.1 0.1  --     Chemistries   Recent Labs Lab 06/10/15 0525 06/12/15 0040  NA 139 138  K 4.0 3.4*  CL 108 105  CO2 23  --   GLUCOSE  97 84  BUN 14 13  CREATININE 1.04 1.10  CALCIUM 8.6*  --   AST 24  --   ALT 15*  --   ALKPHOS 69  --   BILITOT 0.7  --    ------------------------------------------------------------------------------------------------------------------ estimated creatinine clearance is 50.1 mL/min (by C-G formula based on Cr of  1.1). ------------------------------------------------------------------------------------------------------------------ No results for input(s): HGBA1C in the last 72 hours. ------------------------------------------------------------------------------------------------------------------ No results for input(s): CHOL, HDL, LDLCALC, TRIG, CHOLHDL, LDLDIRECT in the last 72 hours. ------------------------------------------------------------------------------------------------------------------ No results for input(s): TSH, T4TOTAL, T3FREE, THYROIDAB in the last 72 hours.  Invalid input(s): FREET3 ------------------------------------------------------------------------------------------------------------------ No results for input(s): VITAMINB12, FOLATE, FERRITIN, TIBC, IRON, RETICCTPCT in the last 72 hours.  Coagulation profile No results for input(s): INR, PROTIME in the last 168 hours.  No results for input(s): DDIMER in the last 72 hours.  Cardiac Enzymes No results for input(s): CKMB, TROPONINI, MYOGLOBIN in the last 168 hours.  Invalid input(s): CK ------------------------------------------------------------------------------------------------------------------ Invalid input(s): POCBNP   CBG: No results for input(s): GLUCAP in the last 168 hours.     EKG: Independently reviewed. A. fib with RVR rate of 175 with global ST depressions.  Assessment/Plan A. fib with RVR: New onset. chadsvas score 4-5 - Admit to stepdown - Diltiazem drip  - check TSH, magnesium, trend troponins  - Replace electrolyte abnormalities - Echocardiogram - Lovenox per pharmacy order originally place, and then discontinued due to patient's recent history of hematuria with obstruction    Question of pneumonia: Patient states that the left lower lung is always mistaken for pneumonia. Given patient's recent surgical procedures there is possibility of aspiration and pneumonia.  - check pro calcitonin -  check sputum culture  - Empiric antibiotics Levaquin for now  Leukocytosis: WBC elevated at 14.7 on admission - Continue to monitor    Recent Urinary tract infection: Patient had been being treated with ciprofloxacin previously per his urologist for recent cystoscopies. Urine culture results from 4/7 showed multiple species. - Check UA   COPD without acute exacerbation: - Continuous pulse oximetry with nasal cannula oxygen as needed - DuoNeb's 3 times a day  - Budesonide and Brovana Nebs every 12 hours - Continue Mucinex  Anemia : Hemoglobin 10.9 on admission  - continue to monitor  Hypokalemia:  Potassium noted to be 3.4 on admission  - K-dur 20 mEq 1 dose now    Hypothyroidism - Check TSH - Continue levothyroxine  BPH s/p Uroloft procedure: Patient had reported urinary obstructions secondary to hematuria now resolved. - Continue alfuzoin   Code Status:   full Family Communication: bedside Disposition Plan: admit   Total time spent 55 minutes.Greater than 50% of this time was spent in counseling, explanation of diagnosis, planning of further management, and coordination of care  [facility] Pager [phone]  If 7PM-7AM, please contact night-coverage [email or web address] Password [facility] 06/12/2015, 4:23 AM

## 2015-06-12 NOTE — ED Provider Notes (Addendum)
CSN: [id number]     Arrival date & time 06/12/15  0019 History  By signing my name below, I, [name], attest that this documentation has been prepared under the direction and in the presence of Deari Sessler, MD . Electronically Signed: [name], Scribe. 06/12/2015. 1:41 AM.   No chief complaint on file.  Patient is a 82 y.o. male presenting with palpitations. The history is provided by the EMS personnel and a relative. The history is limited by the condition of the patient. No language interpreter was used.  Palpitations Palpitations quality:  Fast Onset quality:  Sudden Timing:  Constant Progression:  Unchanged Chronicity:  New Context: not anxiety   Relieved by:  Nothing Worsened by:  Nothing Associated symptoms: no hemoptysis   Risk factors: no stress   Level 5 Caveat due to pt condition HPI Comments:  [name] is a 82 y.o. male with PMHx of hypothyroidism, pituitary macroadenoma, CAD, and COPD who presents to the Emergency Department via EMS complaining of worsening shortness of breath onset tonight while laying down to go to bed. Pt has had multiple ED visits and procedures in the past 2 weeks. Pt had cystoscopy 4/3, was seen in the ED for retention 4/7, and had another cystoscopy 4/11. Family reports he had a flush catheter inserted and had bleeders cauterized two days ago under general anesthesia. Family reports pt had catheter removed yesterday morning by Dr. Jeffie Pollock and has urinated three times since. He was seen by Dr. Betsey Holiday in ED ~20 hours ago for fluid retention due to blood clots. Pt reports associated constipation. Pt denies h/o A-fib.  Past Medical History  Diagnosis Date  . Hypothyroidism   . Anemia   . Blood transfusion june 2011, 5 units given    july 2011 some units given  . Enlarged prostate     elevated psa recently  . GERD (gastroesophageal reflux disease)   . Arthritis   . Dizziness     occasional  . Mass, scrotum     L scrotum - Sees  Dr. Jeffie Pollock  . Abnormality of gait 06/07/2013  . Pituitary macroadenoma ([place])   . Restless legs syndrome (RLS)   . Asthma   . Cataract     bilateral removal of cateracts  . Depression   . Complication of anesthesia      no issues,but pt prefers spinal due to Pulmonary problems  . CAD (coronary artery disease)     left main had  a 20% stenosis.  Left anterior descending had 40-50% proximal   stenosis.  Left circumflex had an 80% proximal stenosis.  The  first obtuse marginal artery had a 60% mid stenosis.  The  right  coronary artery  had  a 50-60% proximal stenosis with a 60-70%  mid stenosis  The patient was managed medically. -Dr. Percival Spanish follows -last visit 7'16.   . Pneumonia     No recent.  . Emphysema   . COPD (chronic obstructive pulmonary disease) ([place])     oxygen  on standby in home.  . Enlarged prostate with urinary retention   . Transfusion history     history 4 yrs ago.    Past Surgical History  Procedure Laterality Date  . Coronary stent placement  5 yrs ago  . Hiatal hernia repair  01-04-2008  . Foot surgery  1994 left, 2002 right foot    bilateral foot reconstruciton  . Abdominal hernia repair   2008  . Cataract extraction both eyes    .  Total hip arthroplasty  07/21/2011    Procedure: TOTAL HIP ARTHROPLASTY ANTERIOR APPROACH;  Surgeon: Mauri Pole, MD;  Location: [facility];  Service: Orthopedics;  Laterality: Left;  . Coronary angioplasty    . Total hip arthroplasty Right 09/07/2012    Procedure: RIGHT TOTAL HIP ARTHROPLASTY ANTERIOR APPROACH;  Surgeon: Mcarthur Rossetti, MD;  Location: [facility];  Service: Orthopedics;  Laterality: Right;  . Foot surgery      reconstruction of both feet- no retained hardware.  . Esophagogastroduodenoscopy N/A 04/20/2013    Procedure: ESOPHAGOGASTRODUODENOSCOPY (EGD);  Surgeon: Inda Castle, MD;  Location: [facility];  Service: Endoscopy;  Laterality: N/A;  . Colonoscopy    . Upper gastrointestinal endoscopy    . Cystoscopy  with insertion of urolift N/A 06/01/2015    Procedure: CYSTOSCOPY WITH INSERTION OF UROLIFT x4;  Surgeon: Irine Seal, MD;  Location: [facility];  Service: Urology;  Laterality: N/A;  . Cystoscopy N/A 06/10/2015    Procedure: CYSTOSCOPY FULGRATION OF BLEEDING,  electovapor resection;  Surgeon: Irine Seal, MD;  Location: [facility];  Service: Urology;  Laterality: N/A;   Family History  Problem Relation Age of Onset  . Heart disease Sister   . Heart failure Father   . Colon cancer Neg Hx   . Esophageal cancer Neg Hx   . Rectal cancer Neg Hx   . Stomach cancer Neg Hx    Social History  Substance Use Topics  . Smoking status: Never Smoker   . Smokeless tobacco: Never Used  . Alcohol Use: No    Review of Systems  Unable to perform ROS: Acuity of condition  Respiratory: Negative for hemoptysis.   Cardiovascular: Positive for palpitations.   Allergies  Betadine; Lortab; Penicillins; Zetia; and Zocor  Home Medications   Prior to Admission medications   Medication Sig Start Date End Date Taking? Authorizing Provider  albuterol (PROVENTIL HFA;VENTOLIN HFA) 108 (90 BASE) MCG/ACT inhaler Inhale 2 puffs into the lungs every 6 (six) hours as needed for wheezing or shortness of breath.    Historical Provider, MD  alfuzosin (UROXATRAL) 10 MG 24 hr tablet Take 10 mg by mouth daily with breakfast.    Historical Provider, MD  bromocriptine (PARLODEL) 2.5 MG tablet TAKE  (1)  TABLET TWICE A DAY. Patient taking differently: TAKE 2.5 MG BY MOUTH TWICE A DAY. 04/06/15   Chipper Herb, MD  calcium carbonate (OS-CAL) 600 MG TABS tablet Take 600 mg by mouth 2 (two) times daily with a meal.    Historical Provider, MD  cholecalciferol (VITAMIN D) 1000 UNITS tablet Take 1,000 Units by mouth 2 (two) times daily.    Historical Provider, MD  ciprofloxacin (CIPRO) 250 MG tablet One po bid x 7 days Patient taking differently: Take 250 mg by mouth 2 (two) times daily.  06/05/15   Milton Ferguson, MD  cyanocobalamin 100 MCG  tablet Take 100 mcg by mouth daily.    Historical Provider, MD  ferrous sulfate 325 (65 FE) MG tablet Take 325 mg by mouth daily.     Historical Provider, MD  guaiFENesin (MUCINEX) 600 MG 12 hr tablet Take 600 mg by mouth 2 (two) times daily.     Historical Provider, MD  levothyroxine (SYNTHROID, LEVOTHROID) 50 MCG tablet TAKE 1 TABLET IN MORNING Patient taking differently: TAKE 50 MCG BY MOUTH EVER MORNING 06/03/15   Renato Shin, MD  magnesium oxide (MAG-OX) 400 MG tablet Take 400 mg by mouth daily.    Historical Provider, MD  mineral  oil enema Place 1 enema rectally once as needed for mild constipation, moderate constipation or severe constipation.    Historical Provider, MD  montelukast (SINGULAIR) 10 MG tablet TAKE 1 TABLET DAILY Patient taking differently: TAKE 1 TABLET DAILY AS NEEDED FOR SEASONAL ALLERGIES    Chipper Herb, MD  nitroGLYCERIN (NITROSTAT) 0.4 MG SL tablet Place 0.4 mg under the tongue every 5 (five) minutes as needed for chest pain.    Historical Provider, MD  pantoprazole (PROTONIX) 40 MG tablet Take 1 tablet (40 mg total) by mouth 2 (two) times daily before a meal . 10/13/14   Lafayette Dragon, MD  polyethylene glycol (MIRALAX / Floria Raveling) packet Take 17 g by mouth daily. Patient taking differently: Take 17 g by mouth daily as needed for mild constipation.  04/22/13   Hosie Poisson, MD  sucralfate (CARAFATE) 1 g tablet Take 1 tablet (1 g total) by mouth 4 (four) times daily -  with meals and at bedtime. As needed Patient taking differently: Take 1 g by mouth at bedtime. *may take up to 4 times daily* *dissolves tablet* 05/04/15   Jerene Bears, MD  SYMBICORT 160-4.5 MCG/ACT inhaler Inhale 2 puffs into the lungs 2 (two) times daily. 02/16/15   Chipper Herb, MD  tetrahydrozoline (VISINE) 0.05 % ophthalmic solution Place 1 drop into both eyes as needed (For dry eyes).    Historical Provider, MD  traMADol-acetaminophen (ULTRACET) 37.5-325 MG tablet Take 1 tablet by mouth every 6 (six)  hours as needed. Patient taking differently: Take 1 tablet by mouth every 6 (six) hours as needed for moderate pain or severe pain.  06/01/15   Irine Seal, MD  vitamin E (VITAMIN E) 400 UNIT capsule Take 400 Units by mouth daily.    Historical Provider, MD   BP 99/60 mmHg  Pulse 147  Temp(Src) 97.7 F (36.5 C) (Oral)  Resp 32  Ht 5\' 7"  (1.702 m)  Wt 164 lb (74.39 kg)  BMI 25.68 kg/m2  SpO2 98% Physical Exam  HENT:  Head: Normocephalic and atraumatic.  Mouth/Throat: Oropharynx is clear and moist and mucous membranes are normal. No oropharyngeal exudate.  Eyes: EOM are normal. Pupils are equal, round, and reactive to light.  Neck: Normal range of motion. Neck supple. Carotid bruit is not present.  Cardiovascular: An irregularly irregular rhythm present. Tachycardia present.   Pulses:      Dorsalis pedis pulses are 2+ on the right side, and 2+ on the left side.  Pulmonary/Chest: No stridor. He has no rales.  Diminished breath sounds BL  Abdominal: Soft. Bowel sounds are normal. There is no tenderness. There is no rebound and no guarding.  Musculoskeletal: Normal range of motion. He exhibits no tenderness.  edema of feet  Neurological: He is alert. He has normal reflexes.  Skin: Skin is warm and dry. He is not diaphoretic.  Psychiatric: He has a normal mood and affect.   ED Course  Procedures  DIAGNOSTIC STUDIES:  Oxygen Saturation is 91% on 2 L/min [place], low by my interpretation.    COORDINATION OF CARE:  12:23 AM Will start 4L/min [place]. Ordered stat portable chest x-ray. Will administer 500 bolus of saline then 15 mg Cardizem. Discussed treatment plan with pt and family at bedside and pt and family agreed to plan.  1:51 AM Will increase Cardizem drip.  Labs Review Labs Reviewed  CBC WITH DIFFERENTIAL/PLATELET - Abnormal; Notable for the following:    WBC 14.7 (*)    RBC 3.56 (*)  Hemoglobin 10.9 (*)    HCT 33.0 (*)    Neutro Abs 11.8 (*)    Monocytes Absolute 1.5 (*)     All other components within normal limits  I-STAT CHEM 8, ED - Abnormal; Notable for the following:    Potassium 3.4 (*)    Calcium, Ion 1.10 (*)    Hemoglobin 11.9 (*)    HCT 35.0 (*)    All other components within normal limits  URINALYSIS, ROUTINE W REFLEX MICROSCOPIC (NOT AT [facility])  I-STAT TROPOININ, ED    Imaging Review No results found. I have personally reviewed and evaluated these images and lab results as part of my medical decision-making.   EKG Interpretation   Date/Time:  Friday Shykeem Resurreccion 14 2017 00:22:47 EDT Ventricular Rate:  175 PR Interval:    QRS Duration: 87 QT Interval:  241 QTC Calculation: 411 R Axis:   88 Text Interpretation:  Atrial fibrillation with rapid V-rate Global ST  depression Confirmed by [facility]  MD, Aspasia Rude ([id number]) on 06/12/2015  12:35:24 AM      MDM   Final diagnoses:  None   Results for orders placed or performed during the hospital encounter of 06/12/15  CBC with Differential/Platelet  Result Value Ref Range   WBC 14.7 (H) 4.0 - 10.5 K/uL   RBC 3.56 (L) 4.22 - 5.81 MIL/uL   Hemoglobin 10.9 (L) 13.0 - 17.0 g/dL   HCT 33.0 (L) 39.0 - 52.0 %   MCV 92.7 78.0 - 100.0 fL   MCH 30.6 26.0 - 34.0 pg   MCHC 33.0 30.0 - 36.0 g/dL   RDW 14.3 11.5 - 15.5 %   Platelets 191 150 - 400 K/uL   Neutrophils Relative % 80 %   Neutro Abs 11.8 (H) 1.7 - 7.7 K/uL   Lymphocytes Relative 7 %   Lymphs Abs 1.0 0.7 - 4.0 K/uL   Monocytes Relative 10 %   Monocytes Absolute 1.5 (H) 0.1 - 1.0 K/uL   Eosinophils Relative 2 %   Eosinophils Absolute 0.3 0.0 - 0.7 K/uL   Basophils Relative 1 %   Basophils Absolute 0.1 0.0 - 0.1 K/uL  I-Stat Chem 8, ED  Result Value Ref Range   Sodium 138 135 - 145 mmol/L   Potassium 3.4 (L) 3.5 - 5.1 mmol/L   Chloride 105 101 - 111 mmol/L   BUN 13 6 - 20 mg/dL   Creatinine, Ser 1.10 0.61 - 1.24 mg/dL   Glucose, Bld 84 65 - 99 mg/dL   Calcium, Ion 1.10 (L) 1.13 - 1.30 mmol/L   TCO2 18 0 - 100 mmol/L   Hemoglobin 11.9  (L) 13.0 - 17.0 g/dL   HCT 35.0 (L) 39.0 - 52.0 %  I-stat troponin, ED  Result Value Ref Range   Troponin i, poc 0.00 0.00 - 0.08 ng/mL   Comment 3           No results found.  Filed Vitals:   06/12/15 0045 06/12/15 0100  BP: 98/70 99/60  Pulse: 77 147  Temp:    Resp: 32 32    Medications  dextrose 5 % with diltiazem (CARDIZEM) ADS Med (not administered)  levofloxacin (LEVAQUIN) IVPB 750 mg (not administered)  diltiazem (CARDIZEM) 100 mg in dextrose 5 % 100 mL (1 mg/mL) infusion (15 mg/hr Intravenous Rate/Dose Change 06/12/15 0151)  0.9 %  sodium chloride infusion ( Intravenous New Bag/Given 06/12/15 0033)    MDM Reviewed: previous chart, nursing note and vitals Reviewed previous: labs Interpretation: labs, ECG  and x-ray (normal troponin,  cephalization on CXR by me) Total time providing critical care: 75-105 minutes. This excludes time spent performing separately reportable procedures and services. Consults: admitting MD  CRITICAL CARE Performed by: Carlisle Beers Total critical care time: 90 minutes Critical care time was exclusive of separately billable procedures and treating other patients. Critical care was necessary to treat or prevent imminent or life-threatening deterioration. Critical care was time spent personally by me on the following activities: development of treatment plan with patient and/or surrogate as well as nursing, discussions with consultants, evaluation of patient's response to treatment, examination of patient, obtaining history from patient or surrogate, ordering and performing treatments and interventions, ordering and review of laboratory studies, ordering and review of radiographic studies, pulse oximetry and re-evaluation of patient's condition.   Will admit to inpatient for new onset AFIB  I personally performed the services described in this documentation, which was scribed in my presence. The recorded information has been reviewed and is  accurate.      Veatrice Kells, MD 06/12/15 [id number]  Veatrice Kells, MD 06/12/15 [phone]

## 2015-06-12 NOTE — ED Notes (Signed)
Bolus dose of 15 mg given per verbal order of dilatizem

## 2015-06-12 NOTE — Progress Notes (Addendum)
Pt seen and examined at bedside, admitted after midnight. Please see earlier admission note by Dr. Tamala Julian. Pt admitted for evaluation of sudden onset of fluttering. In ED, noted to be in a-fib with RVR, started on Cardizem drip, apparently determined not to be candidate for [facility] due to high risk bleed, hematuria. Cardiology consulted for assistance. CXR also worrisome for PNA, pt started on Levaquin.   Faye Ramsay, MD  [facility] Pager [phone]  If 7PM-7AM, please contact night-coverage [email or web address] Password [email or web address]

## 2015-06-12 NOTE — ED Notes (Signed)
Per [facility] pt called EMs for CP. Pt took 2 ASA prior to EMs arrival. HR in the 200s upon EMS arrival. SVT. EMS gave 6 adenosine and 12 adenosine with no conversion. EMS then gave 10 cardeziem and pt HR slowed to 140s. Upon arrival to [facility] pt HR in 170's. Uncontrolled Afib. Pt has no history. mnPt had cardiac stents placed approx 7 yrs ago.  Pt had bleed cartorized on his prostate last week and stretched his urethra.   20 LAC 90/56 original BP.   100/50 upon arrival to ED. EMS gave 500 ml bolus fluids   103/65 20R 152HR 98%RA

## 2015-06-12 NOTE — Progress Notes (Signed)
Patient arrived to nursing unit from ER.Patient alert and oriented x 4 . Denies pain shortness of breath pain or discomfort at present time.Remains on IV cardizem at 5mg /hr heart rate 70-80"s rhythm atrial fibrillation.No acute distress noted at present time.Will report off to oncoming nurse.

## 2015-06-12 NOTE — Consult Note (Signed)
CARDIOLOGY CONSULT NOTE   Patient ID: [name] MRN: [id number] DOB/AGE: 06-14-1932 82 y.o.  Admit date: 06/12/2015  Primary Physician   Redge Gainer, MD Primary Cardiologist   Dr Percival Spanish Reason for Consultation   Atrial fib  HPI:[name] is a 82 y.o. year old male with a history of remote LAD stent, cath 2008 managed medically, COPD, hypothyroid, anemia, GERD, BPH, pituitary macroadenoma, L scrotal mass, esoph ulcers causing him to be off ASA/Plavix.  He had several urologic procedures in April and was treated for urinary tract infection as well.  Admitted 04/12 with hematuria. He had a cystoscopy with fulguration of the bleeding and electovapor resection (placed during a previous procedure). He tolerated the procedure well and was having no bleeding issues when he went home on 04/13.  On the night of 04/13, he was resting and felt his heart start to flutter and speed up. He was aware of the rapid and irregular heart rate. He was short of breath. He had a cough but it was not productive.He had no chest pain.he came to the hospital and was in atrial fibrillation.his rate was rapid, as high as 175. He was placed on a Cardizem drip and admitted.  Overnight, he spontaneously converted to sinus rhythm. The Cardizem drip was discontinued. Since he converted to sinus rhythm, he no longer feels the fluttering. He has never had the fluttering before and has no history of presyncope or syncope except in the setting of severe anemia.  He feels better from a heart rhythm standpoint, but his shortness of breath is still worse than usual. He has not been wheezing and has not had fevers or productive cough. His chest x-rays consistent with a left lower lobe pneumonia and he is being treated for that.    Past Medical History  Diagnosis Date  . Hypothyroidism   . Anemia   . Blood transfusion june 2011, 5 units given    july 2011 some units given  . Enlarged prostate    elevated psa recently  . GERD (gastroesophageal reflux disease)   . Arthritis   . Dizziness     occasional  . Mass, scrotum     L scrotum - Sees Dr. Jeffie Pollock  . Abnormality of gait 06/07/2013  . Pituitary macroadenoma ([place])   . Restless legs syndrome (RLS)   . Asthma   . Cataract     bilateral removal of cateracts  . Depression   . Complication of anesthesia      no issues,but pt prefers spinal due to Pulmonary problems  . CAD (coronary artery disease) 03/2006    3.0 x 20 mm TAXUS Perseus DES to the LAD; 01/2007  L main 30%, oLAD 50%, pLAD stent ok, CFX 80%, OM 60%, pRCA 60%, mRCA 70%, oPDA 90%; med rx   . Pneumonia     No recent.  . Emphysema   . COPD (chronic obstructive pulmonary disease) ([place])     oxygen  on standby in home.  . Enlarged prostate with urinary retention   . Transfusion history     history 4 yrs ago.   Marland Kitchen UGIB (upper gastrointestinal bleed) 03/2013    EGD w/ large ulcer at GE junction  . UGIB (upper gastrointestinal bleed) 08/09/2009    At GE junction, rx w/ endoclips  . UGIB (upper gastrointestinal bleed) 09/2009    just above GE junction, rx  w/ 2 endoclips   Past Surgical History  Procedure Laterality Date  .  Coronary stent placement  03/2006    3.0 x 20 mm TAXUS Perseus DES to the LAD  . Hiatal hernia repair  01-04-2008  . Foot surgery  1994 left, 2002 right foot    bilateral foot reconstruciton  . Abdominal hernia repair   2008  . Cataract extraction both eyes    . Total hip arthroplasty  07/21/2011    Procedure: TOTAL HIP ARTHROPLASTY ANTERIOR APPROACH;  Surgeon: Mauri Pole, MD;  Location: [facility];  Service: Orthopedics;  Laterality: Left;  . Cardiac catheterization  01/2007    L main 30%, oLAD 50%,  pLAD stent ok, CFX 80%, OM 60%, pRCA 60%, mRCA 70%, oPDA 90%; med rx  . Total hip arthroplasty Right 09/07/2012    Procedure: RIGHT TOTAL HIP ARTHROPLASTY ANTERIOR APPROACH;  Surgeon: Mcarthur Rossetti, MD;  Location: [facility];  Service: Orthopedics;   Laterality: Right;  . Foot surgery      reconstruction of both feet- no retained hardware.  . Esophagogastroduodenoscopy N/A 04/20/2013    Procedure: ESOPHAGOGASTRODUODENOSCOPY (EGD);  Surgeon: Inda Castle, MD;  Location: [facility];  Service: Endoscopy;  Laterality: N/A;  . Colonoscopy    . Upper gastrointestinal endoscopy  12/2013    Dr Hilarie Fredrickson, gastritis  . Cystoscopy with insertion of urolift N/A 06/01/2015    Procedure: CYSTOSCOPY WITH INSERTION OF UROLIFT x4;  Surgeon: Irine Seal, MD;  Location: [facility];  Service: Urology;  Laterality: N/A;  . Cystoscopy N/A 06/10/2015    Procedure: CYSTOSCOPY FULGRATION OF BLEEDING,  electovapor resection;  Surgeon: Irine Seal, MD;  Location: [facility];  Service: Urology;  Laterality: N/A;    Allergies  Allergen Reactions  . Betadine [Povidone Iodine]     "Skin blisters"  . Lortab [Hydrocodone-Acetaminophen] Nausea And Vomiting  . Penicillins Other (See Comments)    Light headed Has patient had a PCN reaction causing immediate rash, facial/tongue/throat swelling, SOB or lightheadedness with hypotension: Yes Has patient had a PCN reaction causing severe rash involving mucus membranes or skin necrosis: No Has patient had a PCN reaction that required hospitalization No Has patient had a PCN reaction occurring within the last 10 years: No If all of the above answers are "NO", then may proceed with Cephalosporin use.  Marland Kitchen Zetia [Ezetimibe] Other (See Comments)    Muscle weakness  . Zocor [Simvastatin] Nausea Only and Other (See Comments)    muscle weakness    I have reviewed the patient's current medications . alfuzosin  10 mg Oral Q breakfast  . arformoterol  15 mcg Nebulization BID  . aspirin EC  81 mg Oral Daily  . bromocriptine  2.5 mg Oral BID  . budesonide (PULMICORT) nebulizer solution  0.5 mg Nebulization BID  . calcium carbonate  1 tablet Oral BID WC  . cholecalciferol  1,000 Units Oral BID  . ferrous sulfate  325 mg Oral Daily  .  guaiFENesin  600 mg Oral BID  . ipratropium-albuterol  3 mL Nebulization TID  . [START ON 06/13/2015] levofloxacin (LEVAQUIN) IV  750 mg Intravenous Q24H  . levothyroxine  50 mcg Oral QAC breakfast  . magnesium oxide  400 mg Oral Daily  . montelukast  10 mg Oral Daily  . pantoprazole  40 mg Oral BID  . sucralfate  1 g Oral QHS  . cyanocobalamin  100 mcg Oral Daily     acetaminophen, docusate sodium, mineral oil, naphazoline-glycerin, ondansetron (ZOFRAN) IV, polyethylene glycol  Medication Sig  albuterol (PROVENTIL HFA;VENTOLIN HFA) 108 (90 BASE) MCG/ACT  inhaler Inhale 2 puffs into the lungs every 6 (six) hours as needed for wheezing or shortness of breath.  alfuzosin (UROXATRAL) 10 MG 24 hr tablet Take 10 mg by mouth daily with breakfast.  bisacodyl (DULCOLAX) 10 MG suppository Place 10 mg rectally as needed for moderate constipation.  bromocriptine (PARLODEL) 2.5 MG tablet TAKE  (1)  TABLET TWICE A DAY. Patient taking differently: TAKE 2.5 MG BY MOUTH TWICE A DAY.  calcium carbonate (OS-CAL) 600 MG TABS tablet Take 600 mg by mouth 2 (two) times daily with a meal.  cholecalciferol (VITAMIN D) 1000 UNITS tablet Take 1,000 Units by mouth 2 (two) times daily.  ciprofloxacin (CIPRO) 250 MG tablet One po bid x 7 days Patient taking differently: Take 250 mg by mouth 2 (two) times daily.   cyanocobalamin 100 MCG tablet Take 100 mcg by mouth daily.  Docusate Calcium (STOOL SOFTENER PO) Take 1 tablet by mouth 2 (two) times daily as needed (constipation).  ferrous sulfate 325 (65 FE) MG tablet Take 325 mg by mouth daily.   guaiFENesin (MUCINEX) 600 MG 12 hr tablet Take 600 mg by mouth 2 (two) times daily.   levothyroxine (SYNTHROID, LEVOTHROID) 50 MCG tablet TAKE 1 TABLET IN MORNING Patient taking differently: TAKE 50 MCG BY MOUTH EVER MORNING  magnesium oxide (MAG-OX) 400 MG tablet Take 400 mg by mouth daily.  mineral oil enema Place 1 enema rectally once as needed for mild constipation,  moderate constipation or severe constipation.  montelukast (SINGULAIR) 10 MG tablet TAKE 1 TABLET DAILY Patient taking differently: TAKE 1 TABLET DAILY AS NEEDED FOR SEASONAL ALLERGIES  nitroGLYCERIN (NITROSTAT) 0.4 MG SL tablet Place 0.4 mg under the tongue every 5 (five) minutes as needed for chest pain.  pantoprazole (PROTONIX) 40 MG tablet Take 1 tablet (40 mg total) by mouth 2 (two) times daily before a meal .  polyethylene glycol (MIRALAX / GLYCOLAX) packet Take 17 g by mouth daily. Patient taking differently: Take 17 g by mouth daily as needed for mild constipation.   PRESCRIPTION MEDICATION Place 1 application rectally daily as needed (constipation).  sucralfate (CARAFATE) 1 g tablet Take 1 tablet (1 g total) by mouth 4 (four) times daily -  with meals and at bedtime. As needed Patient taking differently: Take 1 g by mouth at bedtime. *may take up to 4 times daily* *dissolves tablet*  SYMBICORT 160-4.5 MCG/ACT inhaler Inhale 2 puffs into the lungs 2 (two) times daily.  tetrahydrozoline (VISINE) 0.05 % ophthalmic solution Place 1 drop into both eyes as needed (For dry eyes).  traMADol-acetaminophen (ULTRACET) 37.5-325 MG tablet Take 1 tablet by mouth every 6 (six) hours as needed. Patient taking differently: Take 1 tablet by mouth every 6 (six) hours as needed for moderate pain or severe pain.   vitamin E (VITAMIN E) 400 UNIT capsule Take 400 Units by mouth daily.     Social History   Social History  . Marital Status: Married    Spouse Name: N/A  . Number of Children: 2  . Years of Education: hs   Occupational History  . retired    Social History Main Topics  . Smoking status: Never Smoker   . Smokeless tobacco: Never Used  . Alcohol Use: No  . Drug Use: No  . Sexual Activity: Not on file   Other Topics Concern  . Not on file   Social History Narrative   Patient drinks caffeine a few times a week.   Patient is right handed.  Family Status  Relation Status Death Age   . Sister Deceased   . Father Deceased 21    chf  . Mother Deceased 64    phlebitis  . Brother Deceased 69    CAD, aneurysm  . Sister Deceased   . Sister Deceased   . Sister Deceased   . Sister Deceased   . Sister Deceased   . Sister Alive   . Brother Alive    Family History  Problem Relation Age of Onset  . Heart disease Sister   . Heart failure Father   . Colon cancer Neg Hx   . Esophageal cancer Neg Hx   . Rectal cancer Neg Hx   . Stomach cancer Neg Hx      ROS:  Full 14 point review of systems complete and found to be negative unless listed above.  Physical Exam: Blood pressure 100/59, pulse 75, temperature 98.5 F (36.9 C), temperature source Oral, resp. rate 21, height 5\' 7"  (1.702 m), weight 171 lb 11.8 oz (77.9 kg), SpO2 100 %.  General: Well developed, well nourished, male in no acute distress Head: Eyes PERRLA, No xanthomas.   Normocephalic and atraumatic, oropharynx without edema or exudate. Dentition: poor Lungs: few rales bases, rhonchi noted, left greater than right Heart: HRRR S1 S2, no rub/gallop, no significant murmur. pulses are 2+ all 4 extrem.   Neck: No carotid bruits. No lymphadenopathy.  JVD minimal elevation. Abdomen: Bowel sounds present, abdomen soft and non-tender without masses or hernias noted. Msk:  No spine or cva tenderness. No weakness, no joint deformities or effusions. Extremities: No clubbing or cyanosis. no edema.  Neuro: Alert and oriented X 3. No focal deficits noted. Psych:  Good affect, responds appropriately Skin: No rashes or lesions noted.  Labs:   Lab Results  Component Value Date   WBC 14.7* 06/12/2015   HGB 11.9* 06/12/2015   HCT 35.0* 06/12/2015   MCV 92.7 06/12/2015   PLT 191 06/12/2015     Recent Labs Lab 06/10/15 0525 06/12/15 0040  NA 139 138  K 4.0 3.4*  CL 108 105  CO2 23  --   BUN 14 13  CREATININE 1.04 1.10  CALCIUM 8.6*  --   PROT 6.3*  --   BILITOT 0.7  --   ALKPHOS 69  --   ALT 15*  --     AST 24  --   GLUCOSE 97 84  ALBUMIN 3.6  --    MAGNESIUM  Date Value Ref Range Status  06/12/2015 1.9 1.7 - 2.4 mg/dL Final    Recent Labs  06/12/15 0528 06/12/15 0819 06/12/15 1056  TROPONINI 0.43* 0.52* 0.57*    Recent Labs  06/12/15 0037  TROPIPOC 0.00   B NATRIURETIC PEPTIDE  Date/Time Value Ref Range Status  06/12/2015 12:33 AM 224.9* 0.0 - 100.0 pg/mL Final   Lab Results  Component Value Date   CHOL 121 06/12/2015   HDL 42 06/12/2015   LDLCALC 70 06/12/2015   TRIG 45 06/12/2015   TSH  Date/Time Value Ref Range Status  06/12/2015 05:29 AM 4.160 0.350 - 4.500 uIU/mL Final  10/07/2014 09:19 AM 3.570 0.450 - 4.500 uIU/mL Final    Echo: ordered  ECG:  06/12/2015 Atrial fib, RVR, rate 175 Flat/downsloping ST depression inferolateral leads - improved when in SR  Myoview: 07/14/2011 Overall Impression: Normal stress nuclear study. LV Ejection Fraction: 72%. LV Wall Motion: NL LV Function; NL Wall Motion  Radiology:  Dg Chest Portable 1 View  06/12/2015  CLINICAL DATA:  Acute onset of tachycardia.  Initial encounter. EXAM: PORTABLE CHEST 1 VIEW COMPARISON:  Chest radiograph performed 04/18/2013 FINDINGS: The lungs are well-aerated. Left basilar airspace opacity raises concern for pneumonia. There is no evidence of pleural effusion or pneumothorax. The cardiomediastinal silhouette is borderline enlarged. No acute osseous abnormalities are seen. IMPRESSION: Left basilar airspace opacity raises concern for pneumonia. Borderline cardiomegaly. Electronically Signed   By: Garald Balding M.D.   On: 06/12/2015 02:15    ASSESSMENT AND PLAN:   The patient was seen today by Dr Radford Pax, the patient evaluated and the data reviewed.  Principal Problem:   Atrial fibrillation with RVR ([facility]) - pt spontaneously converted to SR - continue Cardizem as po rx - start at 30 mg q 6 hr and increase as tolerated - CHADS2VASC=4 (age x 2, HTN, CAD) but pt had been taken off ASA/Plavix  in the past due to UGIB, recently had hematuria - need Urology and GI to clear him before anticoagulation    Elevated troponin - no chest pain - known CAD but last cath 2008, last MV 2013 - could easily have had progression of disease, may need risk-stratification w/ MV, MD advise on doing as OP.  Otherwise, per IM Active Problems:   Hypothyroidism   ANEMIA   Hematuria   Pneumonia   Leukocytosis   Signed: Rosaria Ferries, PA-C 06/12/2015 2:10 PM Beeper [id number]  Co-Sign MD

## 2015-06-12 NOTE — Progress Notes (Signed)
Dr Doyle Askew paged and notified of second set troponin 0.52 . Pt denies chest discomfort, shortness of breath, an in no acute distress.

## 2015-06-13 DIAGNOSIS — R7989 Other specified abnormal findings of blood chemistry: Secondary | ICD-10-CM | POA: Diagnosis not present

## 2015-06-13 DIAGNOSIS — I4891 Unspecified atrial fibrillation: Secondary | ICD-10-CM | POA: Diagnosis not present

## 2015-06-13 DIAGNOSIS — J189 Pneumonia, unspecified organism: Secondary | ICD-10-CM

## 2015-06-13 DIAGNOSIS — R778 Other specified abnormalities of plasma proteins: Secondary | ICD-10-CM | POA: Insufficient documentation

## 2015-06-13 DIAGNOSIS — D649 Anemia, unspecified: Secondary | ICD-10-CM | POA: Diagnosis not present

## 2015-06-13 LAB — CBC
HCT: 27.8 % — ABNORMAL LOW (ref 39.0–52.0)
Hemoglobin: 9.6 g/dL — ABNORMAL LOW (ref 13.0–17.0)
MCH: 32.4 pg (ref 26.0–34.0)
MCHC: 34.5 g/dL (ref 30.0–36.0)
MCV: 93.9 fL (ref 78.0–100.0)
Platelets: 172 10*3/uL (ref 150–400)
RBC: 2.96 MIL/uL — ABNORMAL LOW (ref 4.22–5.81)
RDW: 14.6 % (ref 11.5–15.5)
WBC: 10.7 10*3/uL — ABNORMAL HIGH (ref 4.0–10.5)

## 2015-06-13 LAB — BASIC METABOLIC PANEL
Anion gap: 9 (ref 5–15)
BUN: 10 mg/dL (ref 6–20)
CO2: 21 mmol/L — ABNORMAL LOW (ref 22–32)
Calcium: 8.5 mg/dL — ABNORMAL LOW (ref 8.9–10.3)
Chloride: 109 mmol/L (ref 101–111)
Creatinine, Ser: 1 mg/dL (ref 0.61–1.24)
GFR calc Af Amer: >60 mL/min (ref >60)
GFR calc non Af Amer: >60 mL/min (ref >60)
Glucose, Bld: 93 mg/dL (ref 65–99)
Potassium: 3.7 mmol/L (ref 3.5–5.1)
Sodium: 139 mmol/L (ref 135–145)

## 2015-06-13 LAB — LEGIONELLA PNEUMOPHILA SEROGP 1 UR AG: L. pneumophila Serogp 1 Ur Ag: NEGATIVE

## 2015-06-13 LAB — EXPECTORATED SPUTUM ASSESSMENT W GRAM STAIN, RFLX TO RESP C

## 2015-06-13 LAB — EXPECTORATED SPUTUM ASSESSMENT W REFEX TO RESP CULTURE

## 2015-06-13 MED ORDER — IPRATROPIUM-ALBUTEROL 0.5-2.5 (3) MG/3ML IN SOLN: 3.0000 mL | Freq: Four times a day (QID) | RESPIRATORY_TRACT | Status: DC | PRN

## 2015-06-13 MED ORDER — DILTIAZEM HCL ER COATED BEADS 120 MG PO CP24
120.0000 mg | ORAL_CAPSULE | Freq: Every day | ORAL | Status: DC
  Administered 2015-06-13 – 2015-06-15 (×3): 120 mg via ORAL
  Filled 2015-06-13 (×5): qty 1

## 2015-06-13 NOTE — Progress Notes (Addendum)
Subjective:    Feels better. No further dyspnea. No CP.   Maintaining NSR. Feels nauseated.      Intake/Output Summary (Last 24 hours) at 06/13/15 1104 Last data filed at 06/13/15 0700  Gross per 24 hour  Intake    370 ml  Output    900 ml  Net   -530 ml    Current meds: . alfuzosin  10 mg Oral Q breakfast  . arformoterol  15 mcg Nebulization BID  . aspirin EC  81 mg Oral Daily  . bromocriptine  2.5 mg Oral BID  . budesonide (PULMICORT) nebulizer solution  0.5 mg Nebulization BID  . calcium carbonate  1 tablet Oral BID WC  . cholecalciferol  1,000 Units Oral BID  . diltiazem  30 mg Oral 4 times per day  . ferrous sulfate  325 mg Oral Daily  . guaiFENesin  600 mg Oral BID  . ipratropium-albuterol  3 mL Nebulization TID  . levofloxacin (LEVAQUIN) IV  750 mg Intravenous Q24H  . levothyroxine  50 mcg Oral QAC breakfast  . magnesium oxide  400 mg Oral Daily  . montelukast  10 mg Oral Daily  . pantoprazole  40 mg Oral BID  . sucralfate  1 g Oral QHS  . cyanocobalamin  100 mcg Oral Daily   Infusions:     Objective:  Blood pressure 115/73, pulse 77, temperature 98.6 F (37 C), temperature source Oral, resp. rate 32, height 5\' 7"  (1.702 m), weight 77.9 kg (171 lb 11.8 oz), SpO2 100 %. Weight change:   Physical Exam: General:  Elderly frail appearing. Sitting in chair HEENT: normal Neck: supple. JVP flat . Carotids 2+ bilat; no bruits. No lymphadenopathy or thryomegaly appreciated. Cor: PMI nondisplaced. Regular rate & rhythm. No rubs, gallops or murmurs. Lungs: decreased BS throughout no wheezing Abdomen: soft, nontender, nondistended. No hepatosplenomegaly. No bruits or masses. Good bowel sounds. Extremities: no cyanosis, clubbing, rash, edema Neuro: alert & orientedx3, cranial nerves grossly intact. moves all 4 extremities w/o difficulty. Affect pleasant  Telemetry: NSR 70-80s  Lab Results: Basic Metabolic Panel:  Recent Labs Lab 06/10/15 0525  06/12/15 0040 06/12/15 0528 06/13/15 0247  NA 139 138  --  139  K 4.0 3.4*  --  3.7  CL 108 105  --  109  CO2 23  --   --  21*  GLUCOSE 97 84  --  93  BUN 14 13  --  10  CREATININE 1.04 1.10  --  1.00  CALCIUM 8.6*  --   --  8.5*  MG  --   --  1.9  --    Liver Function Tests:  Recent Labs Lab 06/10/15 0525  AST 24  ALT 15*  ALKPHOS 69  BILITOT 0.7  PROT 6.3*  ALBUMIN 3.6   No results for input(s): LIPASE, AMYLASE in the last 168 hours. No results for input(s): AMMONIA in the last 168 hours. CBC:  Recent Labs Lab 06/10/15 0525 06/12/15 0033 06/12/15 0040 06/13/15 0247  WBC 9.9 14.7*  --  10.7*  NEUTROABS 7.4 11.8*  --   --   HGB 11.5* 10.9* 11.9* 9.6*  HCT 34.1* 33.0* 35.0* 27.8*  MCV 92.2 92.7  --  93.9  PLT 213 191  --  172   Cardiac Enzymes:  Recent Labs Lab 06/12/15 0528 06/12/15 0819 06/12/15 1056  TROPONINI 0.43* 0.52* 0.57*   BNP: Invalid input(s): POCBNP CBG: No results for input(s): GLUCAP in the last 168 hours. Microbiology:  Lab Results  Component Value Date   CULT PENDING 06/12/2015   CULT MULTIPLE SPECIES PRESENT, SUGGEST RECOLLECTION 06/05/2015   CULT NO GROWTH 06/21/2010   CULT NO GROWTH 5 DAYS 10/25/2009   CULT NO GROWTH 5 DAYS 10/24/2009    Recent Labs Lab 06/12/15 0528 06/13/15 0056  CULT PENDING  --   SDES BLOOD LEFT ARM SPUTUM    Imaging: Dg Chest Portable 1 View  06/12/2015  CLINICAL DATA:  Acute onset of tachycardia.  Initial encounter. EXAM: PORTABLE CHEST 1 VIEW COMPARISON:  Chest radiograph performed 04/18/2013 FINDINGS: The lungs are well-aerated. Left basilar airspace opacity raises concern for pneumonia. There is no evidence of pleural effusion or pneumothorax. The cardiomediastinal silhouette is borderline enlarged. No acute osseous abnormalities are seen. IMPRESSION: Left basilar airspace opacity raises concern for pneumonia. Borderline cardiomegaly. Electronically Signed   By: Garald Balding M.D.   On: 06/12/2015  02:15     ASSESSMENT/PLAN:  1. PAF with RVR (new onset) in setting of PNA - pt spontaneously converted to SR - has not been on b-blocker due to COPD. Continue low-dose Cardizem. If recurs would consider amio - CHADS2VASC=4 (age x 2, HTN, CAD) but pt had been taken off ASA/Plavix in the past due to UGIB, recently had hematuria - Doubt he will be candidate for [facility]. If considering, will need Urology and GI to clear him before anticoagulation.  - Await echo  2. Elevated troponin/CAD - possible demand ischemia. ST depression on ECG with AF/RVR - h/o remote LAD stent. Last cath 2008 with non-obstructive CAD - will proceed with Myoview tomorrow per Dr. Radford Pax. Given inability to take ASA or Plavix would only consider cath if Myoview extremely high-risk  3. PNA  -per primary  4. Anemia with h/o multiple GI/GU bleeds -per primary     LOS: 1 day    Glori Bickers, MD 06/13/2015, 11:04 AM

## 2015-06-13 NOTE — Progress Notes (Signed)
Patient ID: [name], male   DOB: 1932/05/20, 82 y.o.   MRN: [id number]    PROGRESS NOTE    KIWAN MULVANEY  [id number] DOB: 1932-09-20 DOA: 06/12/2015  PCP: Redge Gainer, MD   Outpatient Specialists: Dr. Jeffie Pollock - Urologist, Dr. Melvyn Novas - Pulmonologist   Brief Narrative:  82 y.o. year old male with a history of remote LAD stent, cath 2008 managed medically, COPD, hypothyroid, anemia, GERD, BPH, pituitary macroadenoma, L scrotal mass, esoph ulcers causing him to be off ASA/Plavix. Was admitted 04/12 with hematuria. He had a cystoscopy with fulguration of the bleeding and electovapor resection (placed during a previous procedure). He tolerated the procedure well and was having no bleeding issues, was discharged. When he arrived home he felt fluttering in the chest and came back to ED.   In ED, pt was in a-fib with HR up to 175. Pt was started on Cardizem drip and TRH asked to admit for further evaluation.   Assessment & Plan: PAF with RVR (new onset) in setting of PNA, CHADS2VASC = 4 - pt spontaneously converted to SR - no b-blocker due to COPD - Continue low-dose Cardizem - has been taken off ASA/Plavix in the past due to UGIB, recently had hematuria - ECHO pending - will speak with Dr. Jeffie Pollock about ? AC, also paged GI to discuss  Elevated troponin/CAD - possible demand ischemia. ST depression on ECG with AF/RVR - h/o remote LAD stent. Last cath 2008 with non-obstructive CAD - Myoview tomorrow, per cardiology, consider cath if Myoview extremely high-risk  LLL PNA, unknown pathogen - continue Levaquin day #2  Anemia with h/o multiple GI/GU bleeds - GI paged  COPD - stable respiratory status, continue home regimen   DVT prophylaxis: SCD's Code Status: Full  Family Communication: Patient and family at bedside  Disposition Plan: By 4/17  Consultants:  Cardiology  GI  Urology over the phone   Procedures:   None  Antimicrobials:   Levaquin 4/14  -->  Subjective: Denies chest pain or shortness of breath.  Objective: Filed Vitals:   06/13/15 0921 06/13/15 1000 06/13/15 1156 06/13/15 1200  BP:  135/61  107/65  Pulse:  86  88  Temp:   97.6 F (36.4 C)   TempSrc:   Oral   Resp:  30  26  Height:      Weight:      SpO2: 100% 97%  100%    Intake/Output Summary (Last 24 hours) at 06/13/15 1358 Last data filed at 06/13/15 0746  Gross per 24 hour  Intake    490 ml  Output    550 ml  Net    -60 ml   Filed Weights   06/12/15 0034 06/12/15 0644  Weight: 74.39 kg (164 lb) 77.9 kg (171 lb 11.8 oz)    Examination:  General exam: Appears calm and comfortable  Respiratory system: Clear to auscultation. Respiratory effort normal. Diminished breath sounds at bases  Cardiovascular system: S1 & S2 heard, RRR. No JVD, murmurs, rubs, gallops or clicks. No pedal edema. Gastrointestinal system: Abdomen is nondistended, soft and nontender.  Central nervous system: Alert and oriented. No focal neurological deficits. Extremities: Symmetric 5 x 5 power.  Data Reviewed: I have personally reviewed following labs and imaging studies  CBC:  Recent Labs Lab 06/10/15 0525 06/12/15 0033 06/12/15 0040 06/13/15 0247  WBC 9.9 14.7*  --  10.7*  NEUTROABS 7.4 11.8*  --   --   HGB 11.5* 10.9* 11.9* 9.6*  HCT  34.1* 33.0* 35.0* 27.8*  MCV 92.2 92.7  --  93.9  PLT 213 191  --  [id number]   Basic Metabolic Panel:  Recent Labs Lab 06/10/15 0525 06/12/15 0040 06/12/15 0528 06/13/15 0247  NA 139 138  --  139  K 4.0 3.4*  --  3.7  CL 108 105  --  109  CO2 23  --   --  21*  GLUCOSE 97 84  --  93  BUN 14 13  --  10  CREATININE 1.04 1.10  --  1.00  CALCIUM 8.6*  --   --  8.5*  MG  --   --  1.9  --    Liver Function Tests:  Recent Labs Lab 06/10/15 0525  AST 24  ALT 15*  ALKPHOS 69  BILITOT 0.7  PROT 6.3*  ALBUMIN 3.6   Cardiac Enzymes:  Recent Labs Lab 06/12/15 0528 06/12/15 0819 06/12/15 1056  TROPONINI 0.43* 0.52* 0.57*    Lipid Profile:  Recent Labs  06/12/15 0529  CHOL 121  HDL 42  LDLCALC 70  TRIG 45  CHOLHDL 2.9   Thyroid Function Tests:  Recent Labs  06/12/15 0529  TSH 4.160   Urine analysis:    Component Value Date/Time   COLORURINE YELLOW 06/12/2015 [place] 06/12/2015 0438   LABSPEC 1.007 06/12/2015 0438   PHURINE 5.5 06/12/2015 0438   GLUCOSEU NEGATIVE 06/12/2015 0438   HGBUR LARGE* 06/12/2015 0438   BILIRUBINUR NEGATIVE 06/12/2015 0438   BILIRUBINUR neg 12/31/2013 1022   KETONESUR 15* 06/12/2015 0438   PROTEINUR NEGATIVE 06/12/2015 0438   PROTEINUR neg 12/31/2013 1022   UROBILINOGEN negative 12/31/2013 1022   UROBILINOGEN 0.2 04/18/2013 2026   NITRITE NEGATIVE 06/12/2015 0438   NITRITE neg 12/31/2013 1022   LEUKOCYTESUR NEGATIVE 06/12/2015 0438   Recent Results (from the past 240 hour(s))  Urine culture     Status: None   Collection Time: 06/05/15 10:20 PM  Result Value Ref Range Status   Specimen Description URINE, CLEAN CATCH  Final   Special Requests Normal  Final   Report Status 06/08/2015 FINAL  Final  Culture, blood (routine x 2) Call MD if unable to obtain prior to antibiotics being given     Status: None (Preliminary result)   Collection Time: 06/12/15  5:28 AM  Result Value Ref Range Status   Specimen Description BLOOD LEFT ARM  Final   Culture PENDING  Incomplete   Report Status PENDING  Incomplete  MRSA PCR Screening     Status: None   Collection Time: 06/12/15  6:54 AM  Result Value Ref Range Status   MRSA by PCR NEGATIVE NEGATIVE Final  Culture, sputum-assessment     Status: None   Collection Time: 06/13/15 12:56 AM  Result Value Ref Range Status   Specimen Description SPUTUM  Final   Special Requests NONE  Final   Sputum evaluation   Final    THIS SPECIMEN IS ACCEPTABLE. RESPIRATORY CULTURE REPORT TO FOLLOW.   Report Status 06/13/2015 FINAL  Final    Radiology Studies: Dg Chest Portable 1 View 06/12/2015   Left basilar  airspace opacity raises concern for pneumonia. Borderline cardiomegaly.   Scheduled Meds: . alfuzosin  10 mg Oral Q breakfast  . arformoterol  15 mcg Nebulization BID  . aspirin EC  81 mg Oral Daily  . bromocriptine  2.5 mg Oral BID  . budesonide (PULMICORT) nebulizer solution  0.5 mg Nebulization BID  . calcium carbonate  1  tablet Oral BID WC  . cholecalciferol  1,000 Units Oral BID  . diltiazem  120 mg Oral Daily  . ferrous sulfate  325 mg Oral Daily  . guaiFENesin  600 mg Oral BID  . levofloxacin (LEVAQUIN) IV  750 mg Intravenous Q24H  . levothyroxine  50 mcg Oral QAC breakfast  . magnesium oxide  400 mg Oral Daily  . montelukast  10 mg Oral Daily  . pantoprazole  40 mg Oral BID  . sucralfate  1 g Oral QHS  . cyanocobalamin  100 mcg Oral Daily   Continuous Infusions:     LOS: 1 day    Time spent: 20 minutes  Faye Ramsay, MD [facility] Pager [phone]  If 7PM-7AM, please contact night-coverage [email or web address] Password [email or web address] 06/13/2015, 1:58 PM

## 2015-06-14 ENCOUNTER — Observation Stay (HOSPITAL_COMMUNITY): Payer: Medicare Other

## 2015-06-14 ENCOUNTER — Observation Stay (HOSPITAL_BASED_OUTPATIENT_CLINIC_OR_DEPARTMENT_OTHER): Payer: Medicare Other

## 2015-06-14 DIAGNOSIS — R7989 Other specified abnormal findings of blood chemistry: Secondary | ICD-10-CM | POA: Diagnosis not present

## 2015-06-14 DIAGNOSIS — I4891 Unspecified atrial fibrillation: Secondary | ICD-10-CM | POA: Diagnosis not present

## 2015-06-14 DIAGNOSIS — R079 Chest pain, unspecified: Secondary | ICD-10-CM | POA: Diagnosis not present

## 2015-06-14 DIAGNOSIS — D649 Anemia, unspecified: Secondary | ICD-10-CM | POA: Diagnosis not present

## 2015-06-14 LAB — BASIC METABOLIC PANEL
Anion gap: 8 (ref 5–15)
BUN: 8 mg/dL (ref 6–20)
CO2: 22 mmol/L (ref 22–32)
Calcium: 8.3 mg/dL — ABNORMAL LOW (ref 8.9–10.3)
Chloride: 109 mmol/L (ref 101–111)
Creatinine, Ser: 0.82 mg/dL (ref 0.61–1.24)
GFR calc Af Amer: >60 mL/min (ref >60)
GFR calc non Af Amer: >60 mL/min (ref >60)
Glucose, Bld: 91 mg/dL (ref 65–99)
Potassium: 3.8 mmol/L (ref 3.5–5.1)
Sodium: 139 mmol/L (ref 135–145)

## 2015-06-14 LAB — CBC
HCT: 27.9 % — ABNORMAL LOW (ref 39.0–52.0)
Hemoglobin: 9.2 g/dL — ABNORMAL LOW (ref 13.0–17.0)
MCH: 30.8 pg (ref 26.0–34.0)
MCHC: 33 g/dL (ref 30.0–36.0)
MCV: 93.3 fL (ref 78.0–100.0)
Platelets: 192 10*3/uL (ref 150–400)
RBC: 2.99 MIL/uL — ABNORMAL LOW (ref 4.22–5.81)
RDW: 14.5 % (ref 11.5–15.5)
WBC: 9.2 10*3/uL (ref 4.0–10.5)

## 2015-06-14 LAB — NM MYOCAR MULTI W/SPECT W/WALL MOTION / EF
Estimated workload: 1 METS
Exercise duration (min): 5 min
Exercise duration (sec): 16 s
MPHR: 140 {beats}/min
Peak HR: 106 {beats}/min
Percent HR: 75 %
Rest HR: 88 {beats}/min

## 2015-06-14 LAB — ECHOCARDIOGRAM COMPLETE
Height: 67 in
Weight: 2747.81 oz

## 2015-06-14 LAB — PROCALCITONIN: Procalcitonin: 0.27 ng/mL

## 2015-06-14 MED ORDER — DILTIAZEM HCL ER COATED BEADS 120 MG PO CP24: 120.0000 mg | ORAL_CAPSULE | Freq: Every day | ORAL | Status: DC

## 2015-06-14 MED ORDER — REGADENOSON 0.4 MG/5ML IV SOLN
0.4000 mg | Freq: Once | INTRAVENOUS | Status: AC
  Administered 2015-06-14: 0.4 mg via INTRAVENOUS
  Filled 2015-06-14: qty 5

## 2015-06-14 MED ORDER — TECHNETIUM TC 99M SESTAMIBI GENERIC - CARDIOLITE
30.0000 | Freq: Once | INTRAVENOUS | Status: AC | PRN
  Administered 2015-06-14: 30 via INTRAVENOUS

## 2015-06-14 MED ORDER — BISACODYL 10 MG RE SUPP
10.0000 mg | Freq: Once | RECTAL | Status: AC
  Administered 2015-06-14: 10 mg via RECTAL
  Filled 2015-06-14: qty 1

## 2015-06-14 MED ORDER — TECHNETIUM TC 99M SESTAMIBI GENERIC - CARDIOLITE
10.0000 | Freq: Once | INTRAVENOUS | Status: AC | PRN
  Administered 2015-06-14: 10 via INTRAVENOUS

## 2015-06-14 MED ORDER — LEVOFLOXACIN 750 MG PO TABS
750.0000 mg | ORAL_TABLET | Freq: Every day | ORAL | Status: DC
Start: 2015-06-15 — End: 2015-06-15

## 2015-06-14 MED ORDER — LEVOFLOXACIN 750 MG PO TABS
750.0000 mg | ORAL_TABLET | Freq: Every day | ORAL | Status: DC
  Administered 2015-06-15: 750 mg via ORAL
  Filled 2015-06-14: qty 1

## 2015-06-14 MED ORDER — REGADENOSON 0.4 MG/5ML IV SOLN
INTRAVENOUS | Status: AC
  Filled 2015-06-14: qty 5

## 2015-06-14 NOTE — Progress Notes (Signed)
Report called to [facility].  2200 meds given before transferred. Will continue to monitor. [name]

## 2015-06-14 NOTE — Progress Notes (Signed)
Echocardiogram 2D Echocardiogram has been performed.  [name] 06/14/2015, 12:47 PM

## 2015-06-14 NOTE — Progress Notes (Signed)
Patient presented for Lexiscan. Tolerated procedure well. Result to follow.   Sula Fetterly, PAC 

## 2015-06-14 NOTE — Progress Notes (Signed)
Patient Name: [name] Date of Encounter: 06/14/2015   SUBJECTIVE  Feeling well. No chest pain, sob or palpitations. Seen in nuc med.   CURRENT MEDS . alfuzosin  10 mg Oral Q breakfast  . arformoterol  15 mcg Nebulization BID  . aspirin EC  81 mg Oral Daily  . bromocriptine  2.5 mg Oral BID  . budesonide (PULMICORT) nebulizer solution  0.5 mg Nebulization BID  . calcium carbonate  1 tablet Oral BID WC  . cholecalciferol  1,000 Units Oral BID  . diltiazem  120 mg Oral Daily  . ferrous sulfate  325 mg Oral Daily  . guaiFENesin  600 mg Oral BID  . levofloxacin (LEVAQUIN) IV  750 mg Intravenous Q24H  . levothyroxine  50 mcg Oral QAC breakfast  . magnesium oxide  400 mg Oral Daily  . montelukast  10 mg Oral Daily  . pantoprazole  40 mg Oral BID  . regadenoson      . sucralfate  1 g Oral QHS  . cyanocobalamin  100 mcg Oral Daily    OBJECTIVE  Filed Vitals:   06/14/15 0907 06/14/15 0920 06/14/15 0922 06/14/15 0924  BP: 131/64 136/72 137/57 136/60  Pulse: 78 106 95 95  Temp:      TempSrc:      Resp: 24 35 36 21  Height:      Weight:      SpO2: 96% 97% 94% 91%    Intake/Output Summary (Last 24 hours) at 06/14/15 0926 Last data filed at 06/14/15 0547  Gross per 24 hour  Intake    870 ml  Output   1450 ml  Net   -580 ml   Filed Weights   06/12/15 0034 06/12/15 0644  Weight: 164 lb (74.39 kg) 171 lb 11.8 oz (77.9 kg)    PHYSICAL EXAM  General: Pleasant, frail male in  NAD. Sitting in chair.  Neuro: Alert and oriented X 3. Moves all extremities spontaneously. Psych: Normal affect. HEENT:  Normal  Neck: Supple without bruits or JVD. Lungs:  Resp regular and unlabored, diminished breath sound at base bilaterally.  Heart: RRR no s3, s4, or murmurs. Abdomen: Soft, non-tender, non-distended, BS + x 4.  Extremities: No clubbing, cyanosis or edema. DP/PT/Radials 2+ and equal bilaterally.  Accessory Clinical Findings  CBC  Recent Labs  06/12/15 0033   06/13/15 0247 06/14/15 0307  WBC 14.7*  --  10.7* 9.2  NEUTROABS 11.8*  --   --   --   HGB 10.9*  < > 9.6* 9.2*  HCT 33.0*  < > 27.8* 27.9*  MCV 92.7  --  93.9 93.3  PLT 191  --  172 192  < > = values in this interval not displayed. Basic Metabolic Panel  Recent Labs  06/12/15 0528 06/13/15 0247 06/14/15 0307  NA  --  139 139  K  --  3.7 3.8  CL  --  109 109  CO2  --  21* 22  GLUCOSE  --  93 91  BUN  --  10 8  CREATININE  --  1.00 0.82  CALCIUM  --  8.5* 8.3*  MG 1.9  --   --    Liver Function Tests No results for input(s): AST, ALT, ALKPHOS, BILITOT, PROT, ALBUMIN in the last 72 hours. No results for input(s): LIPASE, AMYLASE in the last 72 hours. Cardiac Enzymes  Recent Labs  06/12/15 0528 06/12/15 0819 06/12/15 1056  TROPONINI 0.43* 0.52* 0.57*  BNP Invalid input(s): POCBNP D-Dimer No results for input(s): DDIMER in the last 72 hours. Hemoglobin A1C No results for input(s): HGBA1C in the last 72 hours. Fasting Lipid Panel  Recent Labs  06/12/15 0529  CHOL 121  HDL 42  LDLCALC 70  TRIG 45  CHOLHDL 2.9   Thyroid Function Tests  Recent Labs  06/12/15 0529  TSH 4.160    TELE  Seen in nuc med  Radiology/Studies  Dg Chest Portable 1 View  06/12/2015  CLINICAL DATA:  Acute onset of tachycardia.  Initial encounter. EXAM: PORTABLE CHEST 1 VIEW COMPARISON:  Chest radiograph performed 04/18/2013 FINDINGS: The lungs are well-aerated. Left basilar airspace opacity raises concern for pneumonia. There is no evidence of pleural effusion or pneumothorax. The cardiomediastinal silhouette is borderline enlarged. No acute osseous abnormalities are seen. IMPRESSION: Left basilar airspace opacity raises concern for pneumonia. Borderline cardiomegaly. Electronically Signed   By: Garald Balding M.D.   On: 06/12/2015 02:15    ASSESSMENT AND PLAN Principal Problem:   Atrial fibrillation with RVR (HCC) Active Problems:   Hypothyroidism   ANEMIA   Hematuria    Pneumonia   Leukocytosis   Atrial fibrillation with rapid ventricular response (HCC)   Elevated troponin    Plan: in sinus rhythm. No chest pain or sob. Troponin of 0.42-->0.52-->0.57. Tolerated lexiscan well, results to follow.   Jarrett Soho PA-C Pager [phone]  Patient seen and examined with Vin Bhagat, PA-C. We discussed all aspects of the encounter. I agree with the assessment and plan as stated above.     ASSESSMENT/PLAN:  1. PAF with RVR (new onset) in setting of PNA  - pt spontaneously converted to SR  - has not been on b-blocker due to COPD. Continue low-dose Cardizem. If recurs would consider amio  - CHADS2VASC=4 (age x 2, HTN, CAD) but pt had been taken off ASA/Plavix in the past due to UGIB, recently had hematuria  - Doubt he will be candidate for [facility]. If considering, will need Urology and GI to clear him before anticoagulation.  - Await echo  2. Elevated troponin/CAD  - possible demand ischemia. ST depression on ECG with AF/RVR  - h/o remote LAD stent. Last cath 2008 with non-obstructive CAD  - had Myoview today. I reviewed images personally. Mild diaphragmatic attenuation. No high-grade ischemia. Manage medically particularly with inability to take ASA or Plavix. 3. PNA  -per primary  4. Anemia with h/o multiple GI/GU bleeds  -per primary    Cardiology will sign off. Please call with questions.   Bensimhon, Daniel,MD 11:12 AM

## 2015-06-14 NOTE — Progress Notes (Signed)
PHARMACIST - PHYSICIAN COMMUNICATION  DR:   Doyle Askew CONCERNING: IV to Oral Route Change Policy  RECOMMENDATION: This patient is receiving Levaquin by the intravenous route.  Based on criteria approved by the Pharmacy and Therapeutics Committee, the intravenous medication(s) is/are being converted to the equivalent oral dose form(s).   DESCRIPTION: These criteria include:  The patient is eating (either orally or via tube) and/or has been taking other orally administered medications for a least 24 hours  The patient has no evidence of active gastrointestinal bleeding or impaired GI absorption (gastrectomy, short bowel, patient on TNA or NPO).  If you have questions about this conversion, please contact the Pharmacy Department  []   [phone] )  [facility] []   [phone] )  [facility] [x]   [phone] )  [facility] []   [phone] )  [facility] []   [phone] )  [place]. Alford Highland, PharmD, [place] Clinical Staff Pharmacist Pager [phone]  Eilene Ghazi Colon, [facility] 06/14/2015 11:21 AM

## 2015-06-14 NOTE — Progress Notes (Signed)
Patient continent of one medium hard stool.  States feels "much better".  Patient anxious to get home to wife, but will need assistance once home.  Family has seen the need for this as well and this was discussed with them.  MD states will transfer patient to regular Med-Surg bed in lieu of pending discharge to home.

## 2015-06-14 NOTE — Progress Notes (Signed)
Pt ambulated in hall aprox 70 feet.  Tolerated well with front wheel walker.  Wife called and informed of pt being transferred to [address]. Will continue to monitor at this time. Saunders Revel T

## 2015-06-14 NOTE — Discharge Instructions (Signed)

## 2015-06-14 NOTE — Discharge Summary (Signed)
Physician Discharge Summary  [name] [id number] DOB: 1932/10/17 DOA: 06/12/2015  PCP: Redge Gainer, MD  Admit date: 06/12/2015 Discharge date: 06/15/2015  Recommendations for Outpatient Follow-up:  1. Pt will need to follow up with PCP in 2-3 weeks post discharge 2. Please obtain BMP to evaluate electrolytes and kidney function 3. Please also check CBC to evaluate Hg and Hct  Discharge Diagnoses:  Principal Problem:   Atrial fibrillation with RVR (HCC) Active Problems:   Hypothyroidism   ANEMIA   Hematuria   Pneumonia   Leukocytosis   Atrial fibrillation with rapid ventricular response (HCC)   Elevated troponin   Discharge Condition: Stable  Diet recommendation: Heart healthy diet discussed in details   Brief Narrative:  82 y.o. year old male with a history of remote LAD stent, cath 2008 managed medically, COPD, hypothyroid, anemia, GERD, BPH, pituitary macroadenoma, L scrotal mass, esoph ulcers causing him to be off ASA/Plavix. Was admitted 04/12 with hematuria. He had a cystoscopy with fulguration of the bleeding and electovapor resection (placed during a previous procedure). He tolerated the procedure well and was having no bleeding issues, was discharged. When he arrived home he felt fluttering in the chest and came back to ED.   In ED, pt was in a-fib with HR up to 175. Pt was started on Cardizem drip and TRH asked to admit for further evaluation.   Assessment & Plan: PAF with RVR (new onset) in setting of PNA, CHADS2VASC = 4 - pt spontaneously converted to SR - no b-blocker due to COPD - Continue low-dose Cardizem - has been taken off ASA/Plavix in the past due to UGIB, recently had hematuria - Myoview unremarkable, pt cleared for d/c from cardio standpoint, awaiting for PT  Elevated troponin/CAD - possible demand ischemia. ST depression on ECG with AF/RVR - h/o remote LAD stent. Last cath 2008 with non-obstructive CAD - Myoview unremarkable, no chest  pain   LLL PNA, unknown pathogen - continue Levaquin day #3  Anemia with h/o multiple GI/GU bleeds - pt not AC candidate per GI   COPD - stable respiratory status, continue home regimen   DVT prophylaxis: SCD's Code Status: Full  Family Communication: Patient and family at bedside  Disposition Plan: By 4/17  Consultants:  Cardiology  GI  Urology over the phone  Procedures:   None  Antimicrobials:   Levaquin 4/14 -->  Discharge Exam: Filed Vitals:   06/14/15 1200 06/14/15 1639  BP: 127/68 111/60  Pulse: 88 85  Temp: 98.4 F (36.9 C) 98.6 F (37 C)  Resp: 29 30   Filed Vitals:   06/14/15 0940 06/14/15 1001 06/14/15 1200 06/14/15 1639  BP:   127/68 111/60  Pulse:  84 88 85  Temp:   98.4 F (36.9 C) 98.6 F (37 C)  TempSrc:   Oral   Resp:  21 29 30   Height:      Weight:      SpO2: 92% 92% 95% 95%    General: Pt is alert, follows commands appropriately, not in acute distress Cardiovascular: Regular rate and rhythm, S1/S2 +, no murmurs, no rubs, no gallops Respiratory: Clear to auscultation bilaterally, no wheezing, no crackles, no rhonchi Abdominal: Soft, non tender, non distended, bowel sounds +, no guarding Extremities: no edema, no cyanosis, pulses palpable bilaterally DP and PT Neuro: Grossly nonfocal  Discharge Instructions     Medication List    TAKE these medications        albuterol 108 (90 Base) MCG/ACT inhaler  Commonly known as:  PROVENTIL HFA;VENTOLIN HFA  Inhale 2 puffs into the lungs every 6 (six) hours as needed for wheezing or shortness of breath.     alfuzosin 10 MG 24 hr tablet  Commonly known as:  UROXATRAL  Take 10 mg by mouth daily with breakfast.     bisacodyl 10 MG suppository  Commonly known as:  DULCOLAX  Place 10 mg rectally as needed for moderate constipation.     bromocriptine 2.5 MG tablet  Commonly known as:  PARLODEL  TAKE  (1)  TABLET TWICE A DAY.     calcium carbonate 600 MG Tabs tablet  Commonly  known as:  OS-CAL  Take 600 mg by mouth 2 (two) times daily with a meal.     cholecalciferol 1000 units tablet  Commonly known as:  VITAMIN D  Take 1,000 Units by mouth 2 (two) times daily.     ciprofloxacin 250 MG tablet  Commonly known as:  CIPRO  One po bid x 7 days     cyanocobalamin 100 MCG tablet  Take 100 mcg by mouth daily.     diltiazem 120 MG 24 hr capsule  Commonly known as:  CARDIZEM CD  Take 1 capsule (120 mg total) by mouth daily.     ferrous sulfate 325 (65 FE) MG tablet  Take 325 mg by mouth daily.     guaiFENesin 600 MG 12 hr tablet  Commonly known as:  MUCINEX  Take 600 mg by mouth 2 (two) times daily.     levofloxacin 750 MG tablet  Commonly known as:  LEVAQUIN  Take 1 tablet (750 mg total) by mouth daily.  Start taking on:  06/15/2015     levothyroxine 50 MCG tablet  Commonly known as:  SYNTHROID, LEVOTHROID  TAKE 1 TABLET IN MORNING     magnesium oxide 400 MG tablet  Commonly known as:  MAG-OX  Take 400 mg by mouth daily.     mineral oil enema  Place 1 enema rectally once as needed for mild constipation, moderate constipation or severe constipation.     montelukast 10 MG tablet  Commonly known as:  SINGULAIR  TAKE 1 TABLET DAILY     nitroGLYCERIN 0.4 MG SL tablet  Commonly known as:  NITROSTAT  Place 0.4 mg under the tongue every 5 (five) minutes as needed for chest pain.     pantoprazole 40 MG tablet  Commonly known as:  PROTONIX  Take 1 tablet (40 mg total) by mouth 2 (two) times daily before a meal .     polyethylene glycol packet  Commonly known as:  MIRALAX / GLYCOLAX  Take 17 g by mouth daily.     PRESCRIPTION MEDICATION  Place 1 application rectally daily as needed (constipation).     STOOL SOFTENER PO  Take 1 tablet by mouth 2 (two) times daily as needed (constipation).     sucralfate 1 g tablet  Commonly known as:  CARAFATE  Take 1 tablet (1 g total) by mouth 4 (four) times daily -  with meals and at bedtime. As needed      SYMBICORT 160-4.5 MCG/ACT inhaler  Generic drug:  budesonide-formoterol  Inhale 2 puffs into the lungs 2 (two) times daily.     traMADol-acetaminophen 37.5-325 MG tablet  Commonly known as:  ULTRACET  Take 1 tablet by mouth every 6 (six) hours as needed.     VISINE 0.05 % ophthalmic solution  Generic drug:  tetrahydrozoline  Place 1 drop into  both eyes as needed (For dry eyes).     vitamin E 400 UNIT capsule  Generic drug:  vitamin E  Take 400 Units by mouth daily.           Follow-up Information    Follow up with Redge Gainer, MD.   Specialty:  [facility]   Contact information:   [place] [address] [phone]        The results of significant diagnostics from this hospitalization (including imaging, microbiology, ancillary and laboratory) are listed below for reference.     Microbiology: Recent Results (from the past 240 hour(s))  Urine culture     Status: None   Collection Time: 06/05/15 10:20 PM  Result Value Ref Range Status   Specimen Description URINE, CLEAN CATCH  Final   Special Requests Normal  Final   Culture MULTIPLE SPECIES PRESENT, SUGGEST RECOLLECTION  Final   Report Status 06/08/2015 FINAL  Final  Culture, blood (routine x 2) Call MD if unable to obtain prior to antibiotics being given     Status: None (Preliminary result)   Collection Time: 06/12/15  5:15 AM  Result Value Ref Range Status   Specimen Description BLOOD RIGHT HAND  Final   Special Requests BOTTLES DRAWN AEROBIC AND ANAEROBIC 10ML  Final   Culture NO GROWTH 2 DAYS  Final   Report Status PENDING  Incomplete  Culture, blood (routine x 2) Call MD if unable to obtain prior to antibiotics being given     Status: None (Preliminary result)   Collection Time: 06/12/15  5:28 AM  Result Value Ref Range Status   Specimen Description BLOOD LEFT ARM  Final   Special Requests IN PEDIATRIC BOTTLE 3ML POF LEVAQUIN 750MG  IV  Final   Culture NO GROWTH 2 DAYS  Final    Report Status PENDING  Incomplete  MRSA PCR Screening     Status: None   Collection Time: 06/12/15  6:54 AM  Result Value Ref Range Status   MRSA by PCR NEGATIVE NEGATIVE Final    Comment:        The GeneXpert MRSA Assay (FDA approved for NASAL specimens only), is one component of a comprehensive MRSA colonization surveillance program. It is not intended to diagnose MRSA infection nor to guide or monitor treatment for MRSA infections.   Culture, sputum-assessment     Status: None   Collection Time: 06/13/15 12:56 AM  Result Value Ref Range Status   Specimen Description SPUTUM  Final   Special Requests NONE  Final   Sputum evaluation   Final    THIS SPECIMEN IS ACCEPTABLE. RESPIRATORY CULTURE REPORT TO FOLLOW.   Report Status 06/13/2015 FINAL  Final  Culture, respiratory (NON-Expectorated)     Status: None (Preliminary result)   Collection Time: 06/13/15 12:56 AM  Result Value Ref Range Status   Specimen Description EXPECTORATED SPUTUM  Final   Special Requests NONE  Final   Gram Stain   Final    ABUNDANT WBC PRESENT, PREDOMINANTLY PMN MODERATE SQUAMOUS EPITHELIAL CELLS PRESENT ABUNDANT GRAM POSITIVE COCCI IN PAIRS IN CLUSTERS MODERATE GRAM NEGATIVE RODS FEW GRAM POSITIVE RODS Performed at [facility]    Culture   Final    Culture reincubated for better growth Performed at [facility]    Report Status PENDING  Incomplete     Labs: Basic Metabolic Panel:  Recent Labs Lab 06/10/15 0525 06/12/15 0040 06/12/15 0528 06/13/15 0247 06/14/15 0307  NA 139 138  --  139 139  K 4.0 3.4*  --  3.7 3.8  CL 108 105  --  109 109  CO2 23  --   --  21* 22  GLUCOSE 97 84  --  93 91  BUN 14 13  --  10 8  CREATININE 1.04 1.10  --  1.00 0.82  CALCIUM 8.6*  --   --  8.5* 8.3*  MG  --   --  1.9  --   --    Liver Function Tests:  Recent Labs Lab 06/10/15 0525  AST 24  ALT 15*  ALKPHOS 69  BILITOT 0.7  PROT 6.3*  ALBUMIN 3.6   No results for input(s):  LIPASE, AMYLASE in the last 168 hours. No results for input(s): AMMONIA in the last 168 hours. CBC:  Recent Labs Lab 06/10/15 0525 06/12/15 0033 06/12/15 0040 06/13/15 0247 06/14/15 0307  WBC 9.9 14.7*  --  10.7* 9.2  NEUTROABS 7.4 11.8*  --   --   --   HGB 11.5* 10.9* 11.9* 9.6* 9.2*  HCT 34.1* 33.0* 35.0* 27.8* 27.9*  MCV 92.2 92.7  --  93.9 93.3  PLT 213 191  --  172 192   Cardiac Enzymes:  Recent Labs Lab 06/12/15 0528 06/12/15 0819 06/12/15 1056  TROPONINI 0.43* 0.52* 0.57*   BNP: BNP (last 3 results)  Recent Labs  06/12/15 0033  BNP 224.9*     SIGNED: Time coordinating discharge: 30 minutes  Faye Ramsay, MD  [facility] 06/14/2015, 7:23 PM Pager [phone]  If 7PM-7AM, please contact night-coverage [email or web address] Password [email or web address]

## 2015-06-15 ENCOUNTER — Other Ambulatory Visit: Payer: Self-pay | Admitting: *Deleted

## 2015-06-15 LAB — CULTURE, RESPIRATORY W GRAM STAIN: Culture: NORMAL

## 2015-06-15 MED ORDER — ARFORMOTEROL TARTRATE 15 MCG/2ML IN NEBU: 15.0000 ug | INHALATION_SOLUTION | Freq: Two times a day (BID) | RESPIRATORY_TRACT | Status: DC

## 2015-06-15 MED ORDER — DILTIAZEM HCL ER COATED BEADS 120 MG PO CP24: 120.0000 mg | ORAL_CAPSULE | Freq: Every day | ORAL | Status: DC

## 2015-06-15 MED ORDER — LEVOFLOXACIN 750 MG PO TABS: 750.0000 mg | ORAL_TABLET | Freq: Every day | ORAL | Status: DC

## 2015-06-15 MED ORDER — ASPIRIN 81 MG PO TBEC: 81.0000 mg | DELAYED_RELEASE_TABLET | Freq: Every day | ORAL | Status: DC

## 2015-06-15 NOTE — Progress Notes (Signed)
SATURATION QUALIFICATIONS: (This note is used to comply with regulatory documentation for home oxygen)  Patient Saturations on Room Air at Rest =98%  Patient Saturations on Room Air while Ambulating = 96%  Patient Saturations on 0 Liters of oxygen while Ambulating = 95-96%  Please briefly explain why patient needs home oxygen:

## 2015-06-15 NOTE — Discharge Summary (Addendum)
Physician Discharge Summary  [name] [id number] DOB: August 21, 1932 DOA: 06/12/2015  PCP: Redge Gainer, MD  Admit date: 06/12/2015 Discharge date: 06/15/2015  Recommendations for Outpatient Follow-up:  1. Pt will need to follow up with PCP in 1-2 weeks post discharge 2. Please ask staff to schedule appointment for pt for follow up  3. Please obtain BMP to evaluate electrolytes and kidney function 4. Please also check CBC to evaluate Hg and Hct 5. Complete Levaquin therapy for three more days post discharge  6. Please note that Myoview test was unremarkable, cardiology team has cleared pt for discharge 7. Please note that pt was determined not to be anticoagulation candidate, could not tolerate ASA or Plavix in the past due to hematuria and known hx of GI bleed, pt and family all aware and want to focus on conservative management for now 8. If pt develops a-fib again, anticoagulation issue can be readdressed with PCP, I have discussed with GI on call and it would be OK for pt take aspirin or plavix if needed but must remain on PPI, GI doctor recommended either asa or Plavix but not both, pt now agrees to try aspirin, he was advised if any bleeding occurs to notify PCP 9. Pt was started on Cardizem for rate control and has tolerated medication well while inpatient   Discharge Diagnoses:  Principal Problem:   Atrial fibrillation with RVR ([place]) Active Problems:   Hypothyroidism   ANEMIA   Hematuria   Pneumonia   Leukocytosis   Atrial fibrillation with rapid ventricular response (HCC)   Elevated troponin   Discharge Condition: Stable  Diet recommendation: Heart healthy diet discussed in details   Brief Narrative:  82 y.o. year old male with a history of remote LAD stent, cath 2008 managed medically, COPD, hypothyroid, anemia, GERD, BPH, pituitary macroadenoma, L scrotal mass, esoph ulcers causing him to be off ASA/Plavix. Was admitted 04/12 with hematuria. He had a cystoscopy  with fulguration of the bleeding and electovapor resection (placed during a previous procedure). He tolerated the procedure well and was having no bleeding issues, was discharged. When he arrived home he felt fluttering in the chest and came back to ED.   In ED, pt was in a-fib with HR up to 175. Pt was started on Cardizem drip and TRH asked to admit for further evaluation.   Assessment & Plan: PAF with RVR (new onset) in setting of PNA, CHADS2VASC = 4 - pt spontaneously converted to SR - no b-blocker due to COPD - Continue low-dose Cardizem upon discharge - has been taken off ASA/Plavix in the past due to UGIB, recently had hematuria - Myoview unremarkable, pt cleared for d/c from cardio standpoint - pt agrees to try aspirin for now   Elevated troponin/CAD - possible demand ischemia. ST depression on ECG with AF/RVR - h/o remote LAD stent. Last cath 2008 with non-obstructive CAD - Myoview unremarkable, no chest pain   LLL PNA, unknown pathogen - continue Levaquin for three more days post discharge   Anemia with h/o multiple GI/GU bleeds - per GI can use aspirin or Plavix but prefer not to be on both just one and must remain on PPI   COPD - stable respiratory status, continue home regimen   DVT prophylaxis: SCD's Code Status: Full  Family Communication: Patient and family at bedside  Disposition Plan: home with [facility] PT  Consultants:  Cardiology  Urology over the phone  GI over the phone   Procedures:   None  Antimicrobials:   Levaquin 4/14 --> three more days post discharge   Discharge Exam: Filed Vitals:   06/14/15 2209 06/15/15 0607  BP: 116/52 115/61  Pulse: 79 71  Temp: 97.8 F (36.6 C) 98 F (36.7 C)  Resp: 20 18   Filed Vitals:   06/14/15 1639 06/14/15 1935 06/14/15 2209 06/15/15 0607  BP: 111/60 116/63 116/52 115/61  Pulse: 85  79 71  Temp: 98.6 F (37 C) 98.4 F (36.9 C) 97.8 F (36.6 C) 98 F (36.7 C)  TempSrc:  Oral Oral Oral  Resp: 30 27  20 18   Height:   5\' 7"  (1.702 m)   Weight:   79.969 kg (176 lb 4.8 oz)   SpO2: 95% 93% 94% 93%    General: Pt is alert, follows commands appropriately, not in acute distress Cardiovascular: Regular rate and rhythm, no rubs, no gallops Respiratory: Clear to auscultation bilaterally, no wheezing, no crackles, no rhonchi Abdominal: Soft, non tender, non distended, bowel sounds +, no guarding Extremities: no edema, no cyanosis, pulses palpable bilaterally DP and PT Neuro: Grossly nonfocal  Discharge Instructions      Discharge Instructions    Diet - low sodium heart healthy    Complete by:  As directed      Increase activity slowly    Complete by:  As directed             Medication List    STOP taking these medications        ciprofloxacin 250 MG tablet  Commonly known as:  CIPRO      TAKE these medications        albuterol 108 (90 Base) MCG/ACT inhaler  Commonly known as:  PROVENTIL HFA;VENTOLIN HFA  Inhale 2 puffs into the lungs every 6 (six) hours as needed for wheezing or shortness of breath.     alfuzosin 10 MG 24 hr tablet  Commonly known as:  UROXATRAL  Take 10 mg by mouth daily with breakfast.     arformoterol 15 MCG/2ML Nebu  Commonly known as:  BROVANA  Take 2 mLs (15 mcg total) by nebulization 2 (two) times daily.     aspirin 81 MG EC tablet  Take 1 tablet (81 mg total) by mouth daily.     bisacodyl 10 MG suppository  Commonly known as:  DULCOLAX  Place 10 mg rectally as needed for moderate constipation.     bromocriptine 2.5 MG tablet  Commonly known as:  PARLODEL  TAKE  (1)  TABLET TWICE A DAY.     calcium carbonate 600 MG Tabs tablet  Commonly known as:  OS-CAL  Take 600 mg by mouth 2 (two) times daily with a meal.     cholecalciferol 1000 units tablet  Commonly known as:  VITAMIN D  Take 1,000 Units by mouth 2 (two) times daily.     cyanocobalamin 100 MCG tablet  Take 100 mcg by mouth daily.     diltiazem 120 MG 24 hr capsule   Commonly known as:  CARDIZEM CD  Take 1 capsule (120 mg total) by mouth daily.     ferrous sulfate 325 (65 FE) MG tablet  Take 325 mg by mouth daily.     guaiFENesin 600 MG 12 hr tablet  Commonly known as:  MUCINEX  Take 600 mg by mouth 2 (two) times daily.     levofloxacin 750 MG tablet  Commonly known as:  LEVAQUIN  Take 1 tablet (750 mg total) by mouth daily.  levothyroxine 50 MCG tablet  Commonly known as:  SYNTHROID, LEVOTHROID  TAKE 1 TABLET IN MORNING     magnesium oxide 400 MG tablet  Commonly known as:  MAG-OX  Take 400 mg by mouth daily.     mineral oil enema  Place 1 enema rectally once as needed for mild constipation, moderate constipation or severe constipation.     montelukast 10 MG tablet  Commonly known as:  SINGULAIR  TAKE 1 TABLET DAILY     nitroGLYCERIN 0.4 MG SL tablet  Commonly known as:  NITROSTAT  Place 0.4 mg under the tongue every 5 (five) minutes as needed for chest pain.     pantoprazole 40 MG tablet  Commonly known as:  PROTONIX  Take 1 tablet (40 mg total) by mouth 2 (two) times daily before a meal .     polyethylene glycol packet  Commonly known as:  MIRALAX / GLYCOLAX  Take 17 g by mouth daily.     PRESCRIPTION MEDICATION  Place 1 application rectally daily as needed (constipation).     STOOL SOFTENER PO  Take 1 tablet by mouth 2 (two) times daily as needed (constipation).     sucralfate 1 g tablet  Commonly known as:  CARAFATE  Take 1 tablet (1 g total) by mouth 4 (four) times daily -  with meals and at bedtime. As needed     SYMBICORT 160-4.5 MCG/ACT inhaler  Generic drug:  budesonide-formoterol  Inhale 2 puffs into the lungs 2 (two) times daily.     traMADol-acetaminophen 37.5-325 MG tablet  Commonly known as:  ULTRACET  Take 1 tablet by mouth every 6 (six) hours as needed.     VISINE 0.05 % ophthalmic solution  Generic drug:  tetrahydrozoline  Place 1 drop into both eyes as needed (For dry eyes).     vitamin E 400  UNIT capsule  Generic drug:  vitamin E  Take 400 Units by mouth daily.        Follow-up Information    Follow up with Redge Gainer, MD.   Specialty:  [facility] Medicine   Contact information:   [place] [address] [phone]       Call Faye Ramsay, MD.   Specialty:  Internal Medicine   Why:  As needed, call my cell phone [phone]   Contact information:   [address] [place] [address] [phone]        The results of significant diagnostics from this hospitalization (including imaging, microbiology, ancillary and laboratory) are listed below for reference.     Microbiology: Recent Results (from the past 240 hour(s))  Urine culture     Status: None   Collection Time: 06/05/15 10:20 PM  Result Value Ref Range Status   Specimen Description URINE, CLEAN CATCH  Final   Special Requests Normal  Final   Culture MULTIPLE SPECIES PRESENT, SUGGEST RECOLLECTION  Final   Report Status 06/08/2015 FINAL  Final  Culture, blood (routine x 2) Call MD if unable to obtain prior to antibiotics being given     Status: None (Preliminary result)   Collection Time: 06/12/15  5:15 AM  Result Value Ref Range Status   Specimen Description BLOOD RIGHT HAND  Final   Special Requests BOTTLES DRAWN AEROBIC AND ANAEROBIC 10ML  Final   Culture NO GROWTH 2 DAYS  Final   Report Status PENDING  Incomplete  Culture, blood (routine x 2) Call MD if unable to obtain prior to  antibiotics being given     Status: None (Preliminary result)   Collection Time: 06/12/15  5:28 AM  Result Value Ref Range Status   Specimen Description BLOOD LEFT ARM  Final   Special Requests IN PEDIATRIC BOTTLE 3ML POF LEVAQUIN 750MG  IV  Final   Culture NO GROWTH 2 DAYS  Final   Report Status PENDING  Incomplete  MRSA PCR Screening     Status: None   Collection Time: 06/12/15  6:54 AM  Result Value Ref Range Status   MRSA by PCR NEGATIVE NEGATIVE Final    Comment:         The GeneXpert MRSA Assay (FDA approved for NASAL specimens only), is one component of a comprehensive MRSA colonization surveillance program. It is not intended to diagnose MRSA infection nor to guide or monitor treatment for MRSA infections.   Culture, sputum-assessment     Status: None   Collection Time: 06/13/15 12:56 AM  Result Value Ref Range Status   Specimen Description SPUTUM  Final   Special Requests NONE  Final   Sputum evaluation   Final    THIS SPECIMEN IS ACCEPTABLE. RESPIRATORY CULTURE REPORT TO FOLLOW.   Report Status 06/13/2015 FINAL  Final  Culture, respiratory (NON-Expectorated)     Status: None (Preliminary result)   Collection Time: 06/13/15 12:56 AM  Result Value Ref Range Status   Specimen Description EXPECTORATED SPUTUM  Final   Special Requests NONE  Final   Gram Stain   Final    ABUNDANT WBC PRESENT, PREDOMINANTLY PMN MODERATE SQUAMOUS EPITHELIAL CELLS PRESENT ABUNDANT GRAM POSITIVE COCCI IN PAIRS IN CLUSTERS MODERATE GRAM NEGATIVE RODS FEW GRAM POSITIVE RODS Performed at [facility]    Culture   Final    Culture reincubated for better growth Performed at [facility]    Report Status PENDING  Incomplete     Labs: Basic Metabolic Panel:  Recent Labs Lab 06/10/15 0525 06/12/15 0040 06/12/15 0528 06/13/15 0247 06/14/15 0307  NA 139 138  --  139 139  K 4.0 3.4*  --  3.7 3.8  CL 108 105  --  109 109  CO2 23  --   --  21* 22  GLUCOSE 97 84  --  93 91  BUN 14 13  --  10 8  CREATININE 1.04 1.10  --  1.00 0.82  CALCIUM 8.6*  --   --  8.5* 8.3*  MG  --   --  1.9  --   --    Liver Function Tests:  Recent Labs Lab 06/10/15 0525  AST 24  ALT 15*  ALKPHOS 69  BILITOT 0.7  PROT 6.3*  ALBUMIN 3.6   No results for input(s): LIPASE, AMYLASE in the last 168 hours. No results for input(s): AMMONIA in the last 168 hours. CBC:  Recent Labs Lab 06/10/15 0525 06/12/15 0033 06/12/15 0040 06/13/15 0247 06/14/15 0307   WBC 9.9 14.7*  --  10.7* 9.2  NEUTROABS 7.4 11.8*  --   --   --   HGB 11.5* 10.9* 11.9* 9.6* 9.2*  HCT 34.1* 33.0* 35.0* 27.8* 27.9*  MCV 92.2 92.7  --  93.9 93.3  PLT 213 191  --  172 192   Cardiac Enzymes:  Recent Labs Lab 06/12/15 0528 06/12/15 0819 06/12/15 1056  TROPONINI 0.43* 0.52* 0.57*   BNP: BNP (last 3 results)  Recent Labs  06/12/15 0033  BNP 224.9*   SIGNED: Time coordinating discharge: 30 minutes  MAGICK-MYERS, ISKRA, MD  [facility] 06/15/2015, 9:54 AM Pager [phone]  If 7PM-7AM, please contact night-coverage [email or web address] Password [email or web address]

## 2015-06-15 NOTE — Care Management Note (Addendum)
Case Management Note  Patient Details  Name: GRECO PURSLEY MRN: [id number] Date of Birth: March 04, 1932  Subjective/Objective:                    Action/Plan:  Confirmed face sheet information with patient. Patient already has home oxygen through [facility] , needs portable tank to get home with. Ordered same through [facility] also ordered NEB machine through [facility]  Expected Discharge Date:                  Expected Discharge Plan:  [place]  In-House Referral:     Discharge planning Services  CM Consult  Post Acute Care Choice:  Home Health, Durable Medical Equipment Choice offered to:  Patient  DME Arranged:    DME Agency:     HH Arranged:  RN, PT, Nurse's Aide [place] Agency:     Status of Service:  Completed, signed off  Medicare Important Message Given:    Date Medicare IM Given:    Medicare IM give by:    Date Additional Medicare IM Given:    Additional Medicare Important Message give by:     If discussed at [place] of Stay Meetings, dates discussed:    Additional Comments:  Marilu Favre, RN 06/15/2015, 11:20 AM

## 2015-06-15 NOTE — Evaluation (Signed)
Physical Therapy Evaluation and Discharge Patient Details Name: [name] MRN: [id number] DOB: 12/27/1932 Today's Date: 06/15/2015   History of Present Illness  Pt is an 82 y/o male who presents with A-fib with RVR in the setting of PNA.   Clinical Impression  Patient evaluated by Physical Therapy with no further acute PT needs identified. All education has been completed and the patient has no further questions. At the time of PT eval pt was able to perform transfers and ambulation with modified independence and use of RW for support. Pt reports he was using a SPC PTA. See below for any follow-up Physical Therapy or equipment needs. PT is signing off. Thank you for this referral.     Follow Up Recommendations Home health PT;Supervision for mobility/OOB    Equipment Recommendations  None recommended by PT    Recommendations for Other Services       Precautions / Restrictions Precautions Precautions: Fall Restrictions Weight Bearing Restrictions: No      Mobility  Bed Mobility               General bed mobility comments: Pt was received walking out of the bathroom with RW.   Transfers Overall transfer level: Modified independent Equipment used: Rolling walker (2 wheeled) Transfers: Sit to/from Stand           General transfer comment: No assist required. No unsteadiness noted.   Ambulation/Gait Ambulation/Gait assistance: Modified independent (Device/Increase time) Ambulation Distance (Feet): 200 Feet Assistive device: Rolling walker (2 wheeled) Gait Pattern/deviations: Step-through pattern;Shuffle;Trunk flexed Gait velocity: Decreased Gait velocity interpretation: Below normal speed for age/gender General Gait Details: Pt ambulating well with RW although demonstrates a somewhat shuffled gait pattern with minimal floor clearance. Is able to make short term corrective changes but not able to maintain.   Stairs            Wheelchair Mobility     Modified Rankin (Stroke Patients Only)       Balance Overall balance assessment: Needs assistance Sitting-balance support: Feet supported;No upper extremity supported Sitting balance-Leahy Scale: Good     Standing balance support: Single extremity supported;During functional activity Standing balance-Leahy Scale: Fair Standing balance comment: Pt is able to perform dynamic standing activity with light touch of 1UE for support.                              Pertinent Vitals/Pain Pain Assessment: No/denies pain    Home Living Family/patient expects to be discharged to:: Private residence Living Arrangements: Spouse/significant other Available Help at Discharge: Family;Personal care attendant;Available 24 hours/day Type of Home: House Home Access: Ramped entrance     Home Layout: One level Home Equipment: Walker - 2 wheels;Cane - single point;Crutches;Shower seat - built in;Grab bars - tub/shower      Prior Function Level of Independence: Independent with assistive device(s)         Comments: Pt reports he has been using a cane at home recently. Does not drive but has family that takes him to the store.      Hand Dominance   Dominant Hand: Right    Extremity/Trunk Assessment   Upper Extremity Assessment: Defer to OT evaluation           Lower Extremity Assessment: Generalized weakness (Age-appropriate strength deficits)      Cervical / Trunk Assessment: Kyphotic (with forward head/rounded shoulders)  Communication   Communication: No difficulties  Cognition Arousal/Alertness: Awake/alert Behavior  During Therapy: WFL for tasks assessed/performed Overall Cognitive Status: Within Functional Limits for tasks assessed                      General Comments      Exercises        Assessment/Plan    PT Assessment All further PT needs can be met in the next venue of care  PT Diagnosis Difficulty walking   PT Problem List Decreased  strength;Decreased activity tolerance;Decreased balance  PT Treatment Interventions     PT Goals (Current goals can be found in the Care Plan section) Acute Rehab PT Goals Patient Stated Goal: Return home this morning PT Goal Formulation: All assessment and education complete, DC therapy    Frequency     Barriers to discharge        Co-evaluation               End of Session Equipment Utilized During Treatment: Gait belt Activity Tolerance: Patient tolerated treatment well Patient left: in chair;with call bell/phone within reach Nurse Communication: Mobility status    Functional Assessment Tool Used: Clinical judgement Functional Limitation: Mobility: Walking and moving around Mobility: Walking and Moving Around Current Status (N0037): At least 1 percent but less than 20 percent impaired, limited or restricted Mobility: Walking and Moving Around Goal Status [phone]): At least 1 percent but less than 20 percent impaired, limited or restricted Mobility: Walking and Moving Around Discharge Status [phone]): At least 1 percent but less than 20 percent impaired, limited or restricted    Time: 0750-0807 PT Time Calculation (min) (ACUTE ONLY): 17 min   Charges:   PT Evaluation $PT Eval Moderate Complexity: 1 Procedure     PT G Codes:   PT G-Codes **NOT FOR INPATIENT CLASS** Functional Assessment Tool Used: Clinical judgement Functional Limitation: Mobility: Walking and moving around Mobility: Walking and Moving Around Current Status (H0388): At least 1 percent but less than 20 percent impaired, limited or restricted Mobility: Walking and Moving Around Goal Status [phone]): At least 1 percent but less than 20 percent impaired, limited or restricted Mobility: Walking and Moving Around Discharge Status [phone]): At least 1 percent but less than 20 percent impaired, limited or restricted    Rolinda Roan 06/15/2015, 8:26 AM   Rolinda Roan, PT, DPT Acute Rehabilitation  Services Pager: [phone]

## 2015-06-15 NOTE — Progress Notes (Signed)
CBG 49 on hospital glucometer. Pt's insulin pump meter reads 72. Patient given a coke and snack, which brought his cbg up to 59 on his meter but only registered 46 on hospital meter. The patient does not trust the hospital meters and insists on using his own. He turned his pump basal rate down 30% to .27/hr and we will recheck his cbg after lunch.

## 2015-06-15 NOTE — Consult Note (Signed)
   [facility] CM Inpatient Consult   06/15/2015  [name] 1932/05/27 [id number]   Patient evaluated for long-term disease management services with [place] [facility] program. Martin Majestic to bedside to discuss and offer [place] [facility] services. Patient is agreeable and written consent signed. Explained to patient that he will receive post hospital transition of care calls and will be evaluated for monthly home visits. Confirmed Primary Care MD as Dr. Redge Gainer.  Confirmed best contact telephone number as [phone] (home) or [phone] (cell). Explained that [place] [facility] will not interfere or replace services provided by home health. [name] reports he will have [place]. He lives with wife. He states he has been the primary caregiver for his wife however. [name] reports his daughter is looking into private duty aide agencies for assistance for patient and wife at home. Discussed his history of COPD and also his recent prostate surgery and his new onset of AFIB with RVR. [name] states he can use the additional follow up as he has "had a rough two weeks."  He is supposed to be discharged today. Left [facility] Care Management packet and contact information at bedside. Will make inpatient RNCM aware that patient will be followed by [place] [facility] post hospital discharge.   Will request for patient to be assigned for transition of care. History of COPD.   Marthenia Rolling, MSN-Ed, RN,BSN [facility] [phone]

## 2015-06-16 DIAGNOSIS — I4891 Unspecified atrial fibrillation: Secondary | ICD-10-CM | POA: Diagnosis not present

## 2015-06-16 DIAGNOSIS — J449 Chronic obstructive pulmonary disease, unspecified: Secondary | ICD-10-CM | POA: Diagnosis not present

## 2015-06-16 DIAGNOSIS — I2510 Atherosclerotic heart disease of native coronary artery without angina pectoris: Secondary | ICD-10-CM | POA: Diagnosis not present

## 2015-06-16 DIAGNOSIS — Z96641 Presence of right artificial hip joint: Secondary | ICD-10-CM | POA: Diagnosis not present

## 2015-06-16 DIAGNOSIS — D649 Anemia, unspecified: Secondary | ICD-10-CM | POA: Diagnosis not present

## 2015-06-16 DIAGNOSIS — Z9981 Dependence on supplemental oxygen: Secondary | ICD-10-CM | POA: Diagnosis not present

## 2015-06-16 DIAGNOSIS — J189 Pneumonia, unspecified organism: Secondary | ICD-10-CM | POA: Diagnosis not present

## 2015-06-16 DIAGNOSIS — G2581 Restless legs syndrome: Secondary | ICD-10-CM | POA: Diagnosis not present

## 2015-06-16 DIAGNOSIS — K219 Gastro-esophageal reflux disease without esophagitis: Secondary | ICD-10-CM | POA: Diagnosis not present

## 2015-06-16 DIAGNOSIS — Z955 Presence of coronary angioplasty implant and graft: Secondary | ICD-10-CM | POA: Diagnosis not present

## 2015-06-16 DIAGNOSIS — E039 Hypothyroidism, unspecified: Secondary | ICD-10-CM | POA: Diagnosis not present

## 2015-06-16 NOTE — Anesthesia Postprocedure Evaluation (Signed)
Anesthesia Post Note  Patient: [name]  Procedure(s) Performed: Procedure(s) (LRB): CYSTOSCOPY FULGRATION OF BLEEDING,  electovapor resection (N/A)  Patient location during evaluation: PACU Anesthesia Type: General Level of consciousness: awake and alert Pain management: pain level controlled Vital Signs Assessment: post-procedure vital signs reviewed and stable Respiratory status: spontaneous breathing, nonlabored ventilation, respiratory function stable and patient connected to nasal cannula oxygen Cardiovascular status: blood pressure returned to baseline and stable Postop Assessment: no signs of nausea or vomiting Anesthetic complications: no                 Zenaida Deed

## 2015-06-17 ENCOUNTER — Encounter: Payer: Self-pay | Admitting: *Deleted

## 2015-06-17 ENCOUNTER — Encounter: Payer: Self-pay | Admitting: Cardiology

## 2015-06-17 ENCOUNTER — Other Ambulatory Visit: Payer: Self-pay | Admitting: *Deleted

## 2015-06-17 LAB — CULTURE, BLOOD (ROUTINE X 2)
Culture: NO GROWTH
Culture: NO GROWTH

## 2015-06-17 NOTE — Patient Outreach (Signed)
Transition of care call #1 - Pt has signed consent in the hosptial. He is doing well. He is getting home health [facility]. He will be getting PT and nursing. He lives with his wife who is in poor health. Their family have been supervising their care and assisting them. They are trying to work out an arrangement with a grand daughter that is a CNA for some additional assistance.  Pt has all his meds. Has his follow up appt scheduled with primary and cardiology. Pt also has pulmonologist, urologist and endocrinologist.  I have given pt my phone number and our 24 hour nurse line number for problems or questions.  I will call him again next week.  Deloria Lair [facility] [place] [phone]

## 2015-06-18 DIAGNOSIS — Z96641 Presence of right artificial hip joint: Secondary | ICD-10-CM | POA: Diagnosis not present

## 2015-06-18 DIAGNOSIS — D649 Anemia, unspecified: Secondary | ICD-10-CM | POA: Diagnosis not present

## 2015-06-18 DIAGNOSIS — G2581 Restless legs syndrome: Secondary | ICD-10-CM | POA: Diagnosis not present

## 2015-06-18 DIAGNOSIS — E039 Hypothyroidism, unspecified: Secondary | ICD-10-CM | POA: Diagnosis not present

## 2015-06-18 DIAGNOSIS — J189 Pneumonia, unspecified organism: Secondary | ICD-10-CM | POA: Diagnosis not present

## 2015-06-18 DIAGNOSIS — J449 Chronic obstructive pulmonary disease, unspecified: Secondary | ICD-10-CM | POA: Diagnosis not present

## 2015-06-18 DIAGNOSIS — I2510 Atherosclerotic heart disease of native coronary artery without angina pectoris: Secondary | ICD-10-CM | POA: Diagnosis not present

## 2015-06-18 DIAGNOSIS — Z955 Presence of coronary angioplasty implant and graft: Secondary | ICD-10-CM | POA: Diagnosis not present

## 2015-06-18 DIAGNOSIS — I4891 Unspecified atrial fibrillation: Secondary | ICD-10-CM | POA: Diagnosis not present

## 2015-06-18 DIAGNOSIS — K219 Gastro-esophageal reflux disease without esophagitis: Secondary | ICD-10-CM | POA: Diagnosis not present

## 2015-06-18 DIAGNOSIS — Z9981 Dependence on supplemental oxygen: Secondary | ICD-10-CM | POA: Diagnosis not present

## 2015-06-19 ENCOUNTER — Ambulatory Visit (INDEPENDENT_AMBULATORY_CARE_PROVIDER_SITE_OTHER): Payer: Self-pay | Admitting: Urology

## 2015-06-19 DIAGNOSIS — G2581 Restless legs syndrome: Secondary | ICD-10-CM | POA: Diagnosis not present

## 2015-06-19 DIAGNOSIS — N401 Enlarged prostate with lower urinary tract symptoms: Secondary | ICD-10-CM

## 2015-06-19 DIAGNOSIS — N3941 Urge incontinence: Secondary | ICD-10-CM

## 2015-06-19 DIAGNOSIS — Z96641 Presence of right artificial hip joint: Secondary | ICD-10-CM | POA: Diagnosis not present

## 2015-06-19 DIAGNOSIS — I4891 Unspecified atrial fibrillation: Secondary | ICD-10-CM | POA: Diagnosis not present

## 2015-06-19 DIAGNOSIS — I2510 Atherosclerotic heart disease of native coronary artery without angina pectoris: Secondary | ICD-10-CM | POA: Diagnosis not present

## 2015-06-19 DIAGNOSIS — J449 Chronic obstructive pulmonary disease, unspecified: Secondary | ICD-10-CM | POA: Diagnosis not present

## 2015-06-19 DIAGNOSIS — E039 Hypothyroidism, unspecified: Secondary | ICD-10-CM | POA: Diagnosis not present

## 2015-06-19 DIAGNOSIS — Z9981 Dependence on supplemental oxygen: Secondary | ICD-10-CM | POA: Diagnosis not present

## 2015-06-19 DIAGNOSIS — J189 Pneumonia, unspecified organism: Secondary | ICD-10-CM | POA: Diagnosis not present

## 2015-06-19 DIAGNOSIS — Z955 Presence of coronary angioplasty implant and graft: Secondary | ICD-10-CM | POA: Diagnosis not present

## 2015-06-19 DIAGNOSIS — K219 Gastro-esophageal reflux disease without esophagitis: Secondary | ICD-10-CM | POA: Diagnosis not present

## 2015-06-19 DIAGNOSIS — D649 Anemia, unspecified: Secondary | ICD-10-CM | POA: Diagnosis not present

## 2015-06-23 DIAGNOSIS — E039 Hypothyroidism, unspecified: Secondary | ICD-10-CM | POA: Diagnosis not present

## 2015-06-23 DIAGNOSIS — G2581 Restless legs syndrome: Secondary | ICD-10-CM | POA: Diagnosis not present

## 2015-06-23 DIAGNOSIS — Z9981 Dependence on supplemental oxygen: Secondary | ICD-10-CM | POA: Diagnosis not present

## 2015-06-23 DIAGNOSIS — Z955 Presence of coronary angioplasty implant and graft: Secondary | ICD-10-CM | POA: Diagnosis not present

## 2015-06-23 DIAGNOSIS — I4891 Unspecified atrial fibrillation: Secondary | ICD-10-CM | POA: Diagnosis not present

## 2015-06-23 DIAGNOSIS — J189 Pneumonia, unspecified organism: Secondary | ICD-10-CM | POA: Diagnosis not present

## 2015-06-23 DIAGNOSIS — J449 Chronic obstructive pulmonary disease, unspecified: Secondary | ICD-10-CM | POA: Diagnosis not present

## 2015-06-23 DIAGNOSIS — Z96641 Presence of right artificial hip joint: Secondary | ICD-10-CM | POA: Diagnosis not present

## 2015-06-23 DIAGNOSIS — D649 Anemia, unspecified: Secondary | ICD-10-CM | POA: Diagnosis not present

## 2015-06-23 DIAGNOSIS — K219 Gastro-esophageal reflux disease without esophagitis: Secondary | ICD-10-CM | POA: Diagnosis not present

## 2015-06-23 DIAGNOSIS — I2510 Atherosclerotic heart disease of native coronary artery without angina pectoris: Secondary | ICD-10-CM | POA: Diagnosis not present

## 2015-06-24 ENCOUNTER — Encounter: Payer: Self-pay | Admitting: *Deleted

## 2015-06-24 ENCOUNTER — Encounter: Payer: Self-pay | Admitting: Family Medicine

## 2015-06-24 ENCOUNTER — Other Ambulatory Visit: Payer: Self-pay | Admitting: *Deleted

## 2015-06-24 ENCOUNTER — Ambulatory Visit (INDEPENDENT_AMBULATORY_CARE_PROVIDER_SITE_OTHER): Payer: Medicare Other | Admitting: Family Medicine

## 2015-06-24 VITALS — BP 107/68 | HR 90 | Temp 97.6°F | Ht 67.0 in | Wt 164.0 lb

## 2015-06-24 DIAGNOSIS — N401 Enlarged prostate with lower urinary tract symptoms: Secondary | ICD-10-CM | POA: Diagnosis not present

## 2015-06-24 DIAGNOSIS — I4891 Unspecified atrial fibrillation: Secondary | ICD-10-CM | POA: Diagnosis not present

## 2015-06-24 DIAGNOSIS — D352 Benign neoplasm of pituitary gland: Secondary | ICD-10-CM

## 2015-06-24 DIAGNOSIS — E038 Other specified hypothyroidism: Secondary | ICD-10-CM | POA: Diagnosis not present

## 2015-06-24 DIAGNOSIS — E034 Atrophy of thyroid (acquired): Secondary | ICD-10-CM | POA: Diagnosis not present

## 2015-06-24 DIAGNOSIS — N138 Other obstructive and reflux uropathy: Secondary | ICD-10-CM

## 2015-06-24 DIAGNOSIS — K2211 Ulcer of esophagus with bleeding: Secondary | ICD-10-CM

## 2015-06-24 DIAGNOSIS — E538 Deficiency of other specified B group vitamins: Secondary | ICD-10-CM | POA: Diagnosis not present

## 2015-06-24 DIAGNOSIS — Z09 Encounter for follow-up examination after completed treatment for conditions other than malignant neoplasm: Secondary | ICD-10-CM | POA: Diagnosis not present

## 2015-06-24 DIAGNOSIS — D353 Benign neoplasm of craniopharyngeal duct: Secondary | ICD-10-CM

## 2015-06-24 DIAGNOSIS — J449 Chronic obstructive pulmonary disease, unspecified: Secondary | ICD-10-CM | POA: Diagnosis not present

## 2015-06-24 DIAGNOSIS — G629 Polyneuropathy, unspecified: Secondary | ICD-10-CM | POA: Diagnosis not present

## 2015-06-24 DIAGNOSIS — E785 Hyperlipidemia, unspecified: Secondary | ICD-10-CM | POA: Diagnosis not present

## 2015-06-24 NOTE — Patient Instructions (Signed)
Please continue to follow-up with cardiology and urology Continue current medicines as recommended by the cardiologist Try to get more rest if possible Continue to drink plenty of fluids Hopefully the stresses that you've been experiencing will settle down Continue to remain off a blood thinner as recommended by the cardiologist

## 2015-06-24 NOTE — Progress Notes (Signed)
Subjective:    Patient ID: [name], male    DOB: 02-11-1933, 82 y.o.   MRN: [id number]  HPI Pt here for hospital follow up from [facility]. He was admitted on 06/12/15 with Dx of a fib, COPD, and elevated troponin. The patient is doing well considering what he is been through recently. He's had a cystoscopy with the urologist with insertion of a urolift. He developed urinary retention and subsequent bleeding which required further surgery to stop the bleeding that occurred. Shortly after this he developed atrial fibrillation. He was not anticoagulated because of his strong history of GI bleeding. From his last hospitalization it was recommended that he have a BMP and a CBC for follow-up purposes. It was also suggested that if he develops further atrial fibrillation that the gastroenterologist felt that an aspirin or Plavix but not both would be okay to use in this patient. He would have to stay on the PPI indefinitely. No specific complaints today. The patient and his wife have a sitter that comes and stays with him during the day. He will get a CBC and a BMP today as a follow-up to the recent hospitalization and bleeding issues he had following the urological procedure. He specifically denies any chest pain or shortness of breath. He is passing his water well but having to wear a depends because he is incontinent a majority of the time. He does have a follow-up appointment with the urologist. He is having no signs of any rectal bleeding but does have dark bowel movements because he is taking iron. He has no problems with swallowing with heartburn indigestion nausea vomiting or diarrhea. Considering all he is been through he is doing well and has a positive attitude. In the midst of everything that has happened to him when he had the atrial fib he also has some pneumonia.    Patient Active Problem List   Diagnosis Date Noted  . Atrial fibrillation with rapid ventricular response ([place])   . Elevated  troponin   . Atrial fibrillation with RVR ([place]) 06/12/2015  . Pneumonia 06/12/2015  . Leukocytosis 06/12/2015  . Clot retention of urine 06/11/2015  . BPH (benign prostatic hypertrophy) with urinary obstruction 06/11/2015  . Postoperative anemia due to acute blood loss 06/11/2015  . Hematuria 06/10/2015  . Neck pain, bilateral 12/31/2013  . Neuropathy ([place]) 12/31/2013  . Vitamin B 12 deficiency 12/31/2013  . Abnormality of gait 06/07/2013  . Esophageal ulcer with bleeding 04/20/2013  . S/P left and ritght THA, AA 07/21/2011  . Special screening for malignant neoplasms, colon 01/03/2011  . Personal history of colonic polyps 01/03/2011  . COPD GOLD I 08/24/2010  . GI BLEED 11/17/2009  . ESOPHAGEAL REFLUX 08/25/2009  . BLOOD IN STOOL-MELENA 08/25/2009  . COLONIC POLYPS 07/17/2008  . ANEMIA 07/17/2008  . HYPOTENSION 07/17/2008  . ESOPHAGITIS 07/17/2008  . DIVERTICULOSIS, MILD 07/17/2008  . DEGENERATIVE JOINT DISEASE 07/17/2008  . OSTEOPOROSIS 07/17/2008  . BPH (benign prostatic hyperplasia) 07/17/2008  . Hyperprolactinemia ([place]) 10/24/2007  . PITUITARY ADENOMA 06/25/2007  . Hypothyroidism 06/25/2007  . Hyperlipidemia LDL goal <70 06/22/2007  . Coronary atherosclerosis 06/22/2007   Outpatient Encounter Prescriptions as of 06/24/2015  Medication Sig  . albuterol (PROVENTIL HFA;VENTOLIN HFA) 108 (90 BASE) MCG/ACT inhaler Inhale 2 puffs into the lungs every 6 (six) hours as needed for wheezing or shortness of breath.  . alfuzosin (UROXATRAL) 10 MG 24 hr tablet Take 10 mg by mouth daily with breakfast.  . arformoterol (BROVANA) 15  MCG/2ML NEBU Take 2 mLs (15 mcg total) by nebulization 2 (two) times daily.  . bisacodyl (DULCOLAX) 10 MG suppository Place 10 mg rectally as needed for moderate constipation.  . bromocriptine (PARLODEL) 2.5 MG tablet TAKE  (1)  TABLET TWICE A DAY. (Patient taking differently: TAKE 2.5 MG BY MOUTH TWICE A DAY.)  . calcium carbonate (OS-CAL) 600 MG TABS tablet  Take 600 mg by mouth 2 (two) times daily with a meal.  . cholecalciferol (VITAMIN D) 1000 UNITS tablet Take 1,000 Units by mouth 2 (two) times daily.  . cyanocobalamin 100 MCG tablet Take 100 mcg by mouth daily.  Marland Kitchen diltiazem (CARDIZEM CD) 120 MG 24 hr capsule Take 1 capsule (120 mg total) by mouth daily.  Mariane Baumgarten Calcium (STOOL SOFTENER PO) Take 1 tablet by mouth 2 (two) times daily as needed (constipation).  . ferrous sulfate 325 (65 FE) MG tablet Take 325 mg by mouth daily.   Marland Kitchen guaiFENesin (MUCINEX) 600 MG 12 hr tablet Take 600 mg by mouth 2 (two) times daily.   Marland Kitchen levothyroxine (SYNTHROID, LEVOTHROID) 50 MCG tablet TAKE 1 TABLET IN MORNING (Patient taking differently: TAKE 50 MCG BY MOUTH EVER MORNING)  . magnesium oxide (MAG-OX) 400 MG tablet Take 400 mg by mouth daily.  . montelukast (SINGULAIR) 10 MG tablet TAKE 1 TABLET DAILY (Patient taking differently: TAKE 1 TABLET DAILY AS NEEDED FOR SEASONAL ALLERGIES)  . pantoprazole (PROTONIX) 40 MG tablet Take 1 tablet (40 mg total) by mouth 2 (two) times daily before a meal .  . polyethylene glycol (MIRALAX / GLYCOLAX) packet Take 17 g by mouth daily. (Patient taking differently: Take 17 g by mouth daily as needed for mild constipation. )  . sucralfate (CARAFATE) 1 g tablet Take 1 tablet (1 g total) by mouth 4 (four) times daily -  with meals and at bedtime. As needed (Patient taking differently: Take 1 g by mouth at bedtime. *may take up to 4 times daily* *dissolves tablet*)  . SYMBICORT 160-4.5 MCG/ACT inhaler Inhale 2 puffs into the lungs 2 (two) times daily.  Marland Kitchen tetrahydrozoline (VISINE) 0.05 % ophthalmic solution Place 1 drop into both eyes as needed (For dry eyes).  . traMADol-acetaminophen (ULTRACET) 37.5-325 MG tablet Take 1 tablet by mouth every 6 (six) hours as needed. (Patient taking differently: Take 1 tablet by mouth every 6 (six) hours as needed for moderate pain or severe pain. )  . vitamin E (VITAMIN E) 400 UNIT capsule Take 400 Units  by mouth daily.  . [DISCONTINUED] aspirin EC 81 MG EC tablet Take 1 tablet (81 mg total) by mouth daily.  . nitroGLYCERIN (NITROSTAT) 0.4 MG SL tablet Place 0.4 mg under the tongue every 5 (five) minutes as needed for chest pain. Reported on 06/24/2015  . [DISCONTINUED] levofloxacin (LEVAQUIN) 750 MG tablet Take 1 tablet (750 mg total) by mouth daily.   No facility-administered encounter medications on file as of 06/24/2015.      Review of Systems  Constitutional: Negative.   HENT: Negative.   Eyes: Negative.   Respiratory: Negative.   Cardiovascular: Negative.   Gastrointestinal: Negative.   Endocrine: Negative.   Genitourinary: Negative.   Musculoskeletal: Negative.   Skin: Negative.   Allergic/Immunologic: Negative.   Neurological: Negative.   Hematological: Negative.   Psychiatric/Behavioral: Negative.        Objective:   Physical Exam  Constitutional: He is oriented to person, place, and time. He appears well-developed and well-nourished. No distress.  HENT:  Head: Normocephalic and atraumatic.  Right Ear: External ear normal.  Left Ear: External ear normal.  Nose: Nose normal.  Mouth/Throat: Oropharynx is clear and moist. No oropharyngeal exudate.  Eyes: Conjunctivae and EOM are normal. Pupils are equal, round, and reactive to light. Right eye exhibits no discharge. Left eye exhibits no discharge. No scleral icterus.  Neck: Normal range of motion. Neck supple. No thyromegaly present.  Cardiovascular: Normal rate, regular rhythm and normal heart sounds.   No murmur heard. Heart is regular at 72/m  Pulmonary/Chest: Effort normal and breath sounds normal. No respiratory distress. He has no wheezes. He has no rales. He exhibits no tenderness.  No rales or rhonchi with good breath sounds anteriorly and posteriorly  Abdominal: Soft. Bowel sounds are normal. He exhibits no mass. There is no tenderness. There is no rebound and no guarding.  Musculoskeletal: Normal range of  motion. He exhibits no edema.  Lymphadenopathy:    He has no cervical adenopathy.  Neurological: He is alert and oriented to person, place, and time.  Skin: Skin is warm and dry. No rash noted.  Psychiatric: He has a normal mood and affect. His behavior is normal. Judgment and thought content normal.  Nursing note and vitals reviewed.  BP 107/68 mmHg  Pulse 90  Temp(Src) 97.6 F (36.4 C) (Oral)  Ht _0  (1.702 m)  Wt 164 lb (74.39 kg)  BMI 25.68 kg/m2        Assessment & Plan:  1. Atrial fibrillation with RVR ([place]) -Continue to follow-up with cardiology. The heart has a normal sinus rhythm today with a good rate at 72/m  2. Atrial fibrillation with rapid ventricular response ([facility]) -The patient has a regular rate and rhythm today and he only gives one instance of maybe when his heart was irregular that only lasted for short period time since he was in the hospital. He continues to take the medicine that was prescribed for him.  3. BPH (benign prostatic hypertrophy) with urinary obstruction -Continue to follow-up with urology  4. Vitamin B 12 deficiency -Continue current treatment  5. Neuropathy ([place]) -The patient has a history of peripheral neuropathy mostly secondary to degenerative arthritic problems in his lower extremities.  6. Esophageal ulcer with bleeding -He has a history of GI bleeding from the past and it has been decided not to have him on any kind of anticoagulation including an aspirin. This is from the patient.  7. COPD GOLD I -His breathing is stable currently  8. Hospital discharge follow-up -He appears to be recovering well from his pneumonia atrophia and multiple urologic procedures. - BMP8+EGFR - CBC with Differential/Platelet  9. Hyperlipidemia LDL goal <70 -Continue current treatment  10. PITUITARY ADENOMA -Continue follow-up with neurosurgery as planned  11. Hypothyroidism due to acquired atrophy of thyroid -Continue current treatment  pending results of future lab work  Patient Instructions  Please continue to follow-up with cardiology and urology Continue current medicines as recommended by the cardiologist Try to get more rest if possible Continue to drink plenty of fluids Hopefully the stresses that you've been experiencing will settle down Continue to remain off a blood thinner as recommended by the cardiologist   Arrie Senate MD

## 2015-06-24 NOTE — Patient Outreach (Signed)
Transition of care call #2. Pt is doing very well. He denies any episodes of AFIB this week. He is urinating fine but does have some urinary incontinence which he does where a depends undergarmet. He is breathing better and using his nebs bid and symbicort daily. He is working with home health PT.   I have encouraged him to call me with any problems or questions.  Deloria Lair [facility] [phone]

## 2015-06-25 ENCOUNTER — Other Ambulatory Visit: Payer: Self-pay | Admitting: *Deleted

## 2015-06-25 DIAGNOSIS — Z9981 Dependence on supplemental oxygen: Secondary | ICD-10-CM | POA: Diagnosis not present

## 2015-06-25 DIAGNOSIS — G2581 Restless legs syndrome: Secondary | ICD-10-CM | POA: Diagnosis not present

## 2015-06-25 DIAGNOSIS — I2510 Atherosclerotic heart disease of native coronary artery without angina pectoris: Secondary | ICD-10-CM | POA: Diagnosis not present

## 2015-06-25 DIAGNOSIS — Z96641 Presence of right artificial hip joint: Secondary | ICD-10-CM | POA: Diagnosis not present

## 2015-06-25 DIAGNOSIS — J449 Chronic obstructive pulmonary disease, unspecified: Secondary | ICD-10-CM | POA: Diagnosis not present

## 2015-06-25 DIAGNOSIS — D649 Anemia, unspecified: Secondary | ICD-10-CM | POA: Diagnosis not present

## 2015-06-25 DIAGNOSIS — J189 Pneumonia, unspecified organism: Secondary | ICD-10-CM | POA: Diagnosis not present

## 2015-06-25 DIAGNOSIS — E039 Hypothyroidism, unspecified: Secondary | ICD-10-CM | POA: Diagnosis not present

## 2015-06-25 DIAGNOSIS — I4891 Unspecified atrial fibrillation: Secondary | ICD-10-CM | POA: Diagnosis not present

## 2015-06-25 DIAGNOSIS — Z955 Presence of coronary angioplasty implant and graft: Secondary | ICD-10-CM | POA: Diagnosis not present

## 2015-06-25 DIAGNOSIS — K219 Gastro-esophageal reflux disease without esophagitis: Secondary | ICD-10-CM | POA: Diagnosis not present

## 2015-06-25 LAB — CBC WITH DIFFERENTIAL/PLATELET
Basophils Absolute: 0.1 10*3/uL (ref 0.0–0.2)
Basos: 2 %
EOS (ABSOLUTE): 0.3 10*3/uL (ref 0.0–0.4)
Eos: 4 %
Hematocrit: 35.1 % — ABNORMAL LOW (ref 37.5–51.0)
Hemoglobin: 11.4 g/dL — ABNORMAL LOW (ref 12.6–17.7)
Immature Grans (Abs): 0.4 10*3/uL — ABNORMAL HIGH (ref 0.0–0.1)
Immature Granulocytes: 5 %
Lymphocytes Absolute: 1.3 10*3/uL (ref 0.7–3.1)
Lymphs: 17 %
MCH: 31.6 pg (ref 26.6–33.0)
MCHC: 32.5 g/dL (ref 31.5–35.7)
MCV: 97 fL (ref 79–97)
Monocytes Absolute: 1.1 10*3/uL — ABNORMAL HIGH (ref 0.1–0.9)
Monocytes: 14 %
Neutrophils Absolute: 4.8 10*3/uL (ref 1.4–7.0)
Neutrophils: 58 %
Platelets: 305 10*3/uL (ref 150–379)
RBC: 3.61 x10E6/uL — ABNORMAL LOW (ref 4.14–5.80)
RDW: 14.7 % (ref 12.3–15.4)
WBC: 8.1 10*3/uL (ref 3.4–10.8)

## 2015-06-25 LAB — BMP8+EGFR
BUN/Creatinine Ratio: 15 (ref 10–24)
BUN: 15 mg/dL (ref 8–27)
CO2: 21 mmol/L (ref 18–29)
Calcium: 8.7 mg/dL (ref 8.6–10.2)
Chloride: 101 mmol/L (ref 96–106)
Creatinine, Ser: 1.02 mg/dL (ref 0.76–1.27)
GFR calc Af Amer: 80 mL/min/{1.73_m2} (ref >59)
GFR calc non Af Amer: 69 mL/min/{1.73_m2} (ref >59)
Glucose: 91 mg/dL (ref 65–99)
Potassium: 4.4 mmol/L (ref 3.5–5.2)
Sodium: 140 mmol/L (ref 134–144)

## 2015-06-25 MED ORDER — PANTOPRAZOLE SODIUM 40 MG PO TBEC: DELAYED_RELEASE_TABLET | ORAL | Status: DC

## 2015-06-26 DIAGNOSIS — Z96641 Presence of right artificial hip joint: Secondary | ICD-10-CM | POA: Diagnosis not present

## 2015-06-26 DIAGNOSIS — I4891 Unspecified atrial fibrillation: Secondary | ICD-10-CM | POA: Diagnosis not present

## 2015-06-26 DIAGNOSIS — J189 Pneumonia, unspecified organism: Secondary | ICD-10-CM | POA: Diagnosis not present

## 2015-06-26 DIAGNOSIS — J449 Chronic obstructive pulmonary disease, unspecified: Secondary | ICD-10-CM | POA: Diagnosis not present

## 2015-06-26 DIAGNOSIS — I2510 Atherosclerotic heart disease of native coronary artery without angina pectoris: Secondary | ICD-10-CM | POA: Diagnosis not present

## 2015-06-26 DIAGNOSIS — E039 Hypothyroidism, unspecified: Secondary | ICD-10-CM | POA: Diagnosis not present

## 2015-06-26 DIAGNOSIS — Z955 Presence of coronary angioplasty implant and graft: Secondary | ICD-10-CM | POA: Diagnosis not present

## 2015-06-26 DIAGNOSIS — G2581 Restless legs syndrome: Secondary | ICD-10-CM | POA: Diagnosis not present

## 2015-06-26 DIAGNOSIS — D649 Anemia, unspecified: Secondary | ICD-10-CM | POA: Diagnosis not present

## 2015-06-26 DIAGNOSIS — Z9981 Dependence on supplemental oxygen: Secondary | ICD-10-CM | POA: Diagnosis not present

## 2015-06-26 DIAGNOSIS — K219 Gastro-esophageal reflux disease without esophagitis: Secondary | ICD-10-CM | POA: Diagnosis not present

## 2015-06-30 DIAGNOSIS — J189 Pneumonia, unspecified organism: Secondary | ICD-10-CM | POA: Diagnosis not present

## 2015-06-30 DIAGNOSIS — J449 Chronic obstructive pulmonary disease, unspecified: Secondary | ICD-10-CM | POA: Diagnosis not present

## 2015-06-30 DIAGNOSIS — G2581 Restless legs syndrome: Secondary | ICD-10-CM | POA: Diagnosis not present

## 2015-06-30 DIAGNOSIS — I4891 Unspecified atrial fibrillation: Secondary | ICD-10-CM | POA: Diagnosis not present

## 2015-06-30 DIAGNOSIS — E039 Hypothyroidism, unspecified: Secondary | ICD-10-CM | POA: Diagnosis not present

## 2015-06-30 DIAGNOSIS — K219 Gastro-esophageal reflux disease without esophagitis: Secondary | ICD-10-CM | POA: Diagnosis not present

## 2015-06-30 DIAGNOSIS — Z9981 Dependence on supplemental oxygen: Secondary | ICD-10-CM | POA: Diagnosis not present

## 2015-06-30 DIAGNOSIS — Z955 Presence of coronary angioplasty implant and graft: Secondary | ICD-10-CM | POA: Diagnosis not present

## 2015-06-30 DIAGNOSIS — Z96641 Presence of right artificial hip joint: Secondary | ICD-10-CM | POA: Diagnosis not present

## 2015-06-30 DIAGNOSIS — D649 Anemia, unspecified: Secondary | ICD-10-CM | POA: Diagnosis not present

## 2015-06-30 DIAGNOSIS — I2510 Atherosclerotic heart disease of native coronary artery without angina pectoris: Secondary | ICD-10-CM | POA: Diagnosis not present

## 2015-07-01 ENCOUNTER — Other Ambulatory Visit: Payer: Self-pay | Admitting: *Deleted

## 2015-07-02 DIAGNOSIS — Z96641 Presence of right artificial hip joint: Secondary | ICD-10-CM | POA: Diagnosis not present

## 2015-07-02 DIAGNOSIS — Z9981 Dependence on supplemental oxygen: Secondary | ICD-10-CM | POA: Diagnosis not present

## 2015-07-02 DIAGNOSIS — K219 Gastro-esophageal reflux disease without esophagitis: Secondary | ICD-10-CM | POA: Diagnosis not present

## 2015-07-02 DIAGNOSIS — I2510 Atherosclerotic heart disease of native coronary artery without angina pectoris: Secondary | ICD-10-CM | POA: Diagnosis not present

## 2015-07-02 DIAGNOSIS — E039 Hypothyroidism, unspecified: Secondary | ICD-10-CM | POA: Diagnosis not present

## 2015-07-02 DIAGNOSIS — D649 Anemia, unspecified: Secondary | ICD-10-CM | POA: Diagnosis not present

## 2015-07-02 DIAGNOSIS — G2581 Restless legs syndrome: Secondary | ICD-10-CM | POA: Diagnosis not present

## 2015-07-02 DIAGNOSIS — J449 Chronic obstructive pulmonary disease, unspecified: Secondary | ICD-10-CM | POA: Diagnosis not present

## 2015-07-02 DIAGNOSIS — J189 Pneumonia, unspecified organism: Secondary | ICD-10-CM | POA: Diagnosis not present

## 2015-07-02 DIAGNOSIS — I4891 Unspecified atrial fibrillation: Secondary | ICD-10-CM | POA: Diagnosis not present

## 2015-07-02 DIAGNOSIS — Z955 Presence of coronary angioplasty implant and graft: Secondary | ICD-10-CM | POA: Diagnosis not present

## 2015-07-06 ENCOUNTER — Other Ambulatory Visit: Payer: Self-pay | Admitting: Family Medicine

## 2015-07-06 ENCOUNTER — Other Ambulatory Visit: Payer: Self-pay | Admitting: Endocrinology

## 2015-07-06 DIAGNOSIS — H35443 Age-related reticular degeneration of retina, bilateral: Secondary | ICD-10-CM | POA: Diagnosis not present

## 2015-07-06 DIAGNOSIS — H04123 Dry eye syndrome of bilateral lacrimal glands: Secondary | ICD-10-CM | POA: Diagnosis not present

## 2015-07-06 DIAGNOSIS — H43813 Vitreous degeneration, bilateral: Secondary | ICD-10-CM | POA: Diagnosis not present

## 2015-07-06 DIAGNOSIS — Z961 Presence of intraocular lens: Secondary | ICD-10-CM | POA: Diagnosis not present

## 2015-07-08 ENCOUNTER — Other Ambulatory Visit: Payer: Self-pay | Admitting: *Deleted

## 2015-07-08 NOTE — Patient Outreach (Signed)
Transition of care call #4 - [name] is doing well. He is gradually building his strength back. He denies and chest pain. He has some occasional SOB and he uses his nebulizer which relieves this very well.   He shared today that his wife is not well and he of course is her primary care giver. I have advised him that he can call me for his issues and if he has questions regarding his wife I would certainly try to help in any way that I can.  I will be calling him one more time next week. I reminded him to call for medical assitance early if any problems arise.  Deloria Lair [facility] [phone]

## 2015-07-13 ENCOUNTER — Ambulatory Visit (INDEPENDENT_AMBULATORY_CARE_PROVIDER_SITE_OTHER): Payer: Medicare Other | Admitting: Family Medicine

## 2015-07-13 ENCOUNTER — Encounter: Payer: Self-pay | Admitting: Family Medicine

## 2015-07-13 VITALS — BP 113/68 | HR 67 | Temp 96.9°F | Ht 67.0 in | Wt 163.0 lb

## 2015-07-13 DIAGNOSIS — S60861A Insect bite (nonvenomous) of right wrist, initial encounter: Secondary | ICD-10-CM | POA: Diagnosis not present

## 2015-07-13 DIAGNOSIS — W57XXXA Bitten or stung by nonvenomous insect and other nonvenomous arthropods, initial encounter: Secondary | ICD-10-CM

## 2015-07-13 MED ORDER — DOXYCYCLINE HYCLATE 100 MG PO TABS: ORAL_TABLET | ORAL | Status: DC

## 2015-07-13 NOTE — Progress Notes (Signed)
Subjective:    Patient ID: [name], male    DOB: 1932/11/13, 82 y.o.   MRN: [id number]  HPI Patient here today for right wrist tick bite. The tick was removed yesterday am.  Patient has had multiple tick exposures over the last few weeks. Some of the bites have been what appears to be deer ticks but he brings in a tic today that he pulled on the dorsum of his right wrist that is a nap would tic. Unclear how long the tick was attached but it certainly has been off of him for less than 72 hours. There is some erythema at the bite site but no rashes.   Patient Active Problem List   Diagnosis Date Noted  . Atrial fibrillation with rapid ventricular response ([place])   . Elevated troponin   . Atrial fibrillation with RVR ([place]) 06/12/2015  . Pneumonia 06/12/2015  . Leukocytosis 06/12/2015  . Clot retention of urine 06/11/2015  . BPH (benign prostatic hypertrophy) with urinary obstruction 06/11/2015  . Postoperative anemia due to acute blood loss 06/11/2015  . Hematuria 06/10/2015  . Neck pain, bilateral 12/31/2013  . Neuropathy ([place]) 12/31/2013  . Vitamin B 12 deficiency 12/31/2013  . Abnormality of gait 06/07/2013  . Esophageal ulcer with bleeding 04/20/2013  . S/P left and ritght THA, AA 07/21/2011  . Special screening for malignant neoplasms, colon 01/03/2011  . Personal history of colonic polyps 01/03/2011  . COPD GOLD I 08/24/2010  . GI BLEED 11/17/2009  . ESOPHAGEAL REFLUX 08/25/2009  . BLOOD IN STOOL-MELENA 08/25/2009  . COLONIC POLYPS 07/17/2008  . ANEMIA 07/17/2008  . HYPOTENSION 07/17/2008  . ESOPHAGITIS 07/17/2008  . DIVERTICULOSIS, MILD 07/17/2008  . DEGENERATIVE JOINT DISEASE 07/17/2008  . OSTEOPOROSIS 07/17/2008  . BPH (benign prostatic hyperplasia) 07/17/2008  . Hyperprolactinemia ([place]) 10/24/2007  . PITUITARY ADENOMA 06/25/2007  . Hypothyroidism 06/25/2007  . Hyperlipidemia LDL goal <70 06/22/2007  . Coronary atherosclerosis 06/22/2007   Outpatient  Encounter Prescriptions as of 07/13/2015  Medication Sig  . albuterol (PROVENTIL HFA;VENTOLIN HFA) 108 (90 BASE) MCG/ACT inhaler Inhale 2 puffs into the lungs every 6 (six) hours as needed for wheezing or shortness of breath.  Marland Kitchen arformoterol (BROVANA) 15 MCG/2ML NEBU Take 2 mLs (15 mcg total) by nebulization 2 (two) times daily.  . bisacodyl (DULCOLAX) 10 MG suppository Place 10 mg rectally as needed for moderate constipation.  . bromocriptine (PARLODEL) 2.5 MG tablet TAKE  (1)  TABLET TWICE A DAY.  . calcium carbonate (OS-CAL) 600 MG TABS tablet Take 600 mg by mouth 2 (two) times daily with a meal.  . cholecalciferol (VITAMIN D) 1000 UNITS tablet Take 1,000 Units by mouth 2 (two) times daily.  . cyanocobalamin 100 MCG tablet Take 100 mcg by mouth daily.  Marland Kitchen diltiazem (CARDIZEM CD) 120 MG 24 hr capsule Take 1 capsule (120 mg total) by mouth daily.  Mariane Baumgarten Calcium (STOOL SOFTENER PO) Take 1 tablet by mouth 2 (two) times daily as needed (constipation).  . ferrous sulfate 325 (65 FE) MG tablet Take 325 mg by mouth daily.   Marland Kitchen guaiFENesin (MUCINEX) 600 MG 12 hr tablet Take 600 mg by mouth 2 (two) times daily.   Marland Kitchen levothyroxine (SYNTHROID, LEVOTHROID) 50 MCG tablet TAKE 1 TABLET IN MORNING  . magnesium oxide (MAG-OX) 400 MG tablet Take 400 mg by mouth daily.  . montelukast (SINGULAIR) 10 MG tablet TAKE 1 TABLET DAILY (Patient taking differently: TAKE 1 TABLET DAILY AS NEEDED FOR SEASONAL ALLERGIES)  . nitroGLYCERIN (  NITROSTAT) 0.4 MG SL tablet Place 0.4 mg under the tongue every 5 (five) minutes as needed for chest pain. Reported on 06/24/2015  . pantoprazole (PROTONIX) 40 MG tablet Take 1 tablet (40 mg total) by mouth 2 (two) times daily before a meal .  . polyethylene glycol (MIRALAX / GLYCOLAX) packet Take 17 g by mouth daily. (Patient taking differently: Take 17 g by mouth daily as needed for mild constipation. )  . sucralfate (CARAFATE) 1 g tablet Take 1 tablet (1 g total) by mouth 4 (four) times  daily -  with meals and at bedtime. As needed (Patient taking differently: Take 1 g by mouth at bedtime. *may take up to 4 times daily* *dissolves tablet*)  . SYMBICORT 160-4.5 MCG/ACT inhaler Inhale 2 puffs into the lungs 2 (two) times daily.  Marland Kitchen tetrahydrozoline (VISINE) 0.05 % ophthalmic solution Place 1 drop into both eyes as needed (For dry eyes).  . traMADol-acetaminophen (ULTRACET) 37.5-325 MG tablet Take 1 tablet by mouth every 6 (six) hours as needed. (Patient taking differently: Take 1 tablet by mouth every 6 (six) hours as needed for moderate pain or severe pain. )  . vitamin E (VITAMIN E) 400 UNIT capsule Take 400 Units by mouth daily.  . [DISCONTINUED] alfuzosin (UROXATRAL) 10 MG 24 hr tablet Take 10 mg by mouth daily with breakfast.   No facility-administered encounter medications on file as of 07/13/2015.      Review of Systems  Constitutional: Negative.   HENT: Negative.   Eyes: Negative.   Respiratory: Negative.   Cardiovascular: Negative.   Gastrointestinal: Negative.   Endocrine: Negative.   Genitourinary: Negative.   Musculoskeletal: Negative.   Skin: Negative.        Tick bite - pulled off yesterday am  Allergic/Immunologic: Negative.   Neurological: Negative.   Hematological: Negative.   Psychiatric/Behavioral: Negative.        Objective:   Physical Exam  Constitutional: He appears well-developed and well-nourished.  Skin:  Local erythematous reaction where take was attached. A tic does appear intact do not see any part of the tick left and the skin   BP 113/68 mmHg  Pulse 67  Temp(Src) 96.9 F (36.1 C) (Oral)  Ht 5\' 7"  (1.702 m)  Wt 163 lb (73.936 kg)  BMI 25.52 kg/m2        Assessment & Plan:  1. Tick bite Continue local care of bite area. Will give prophylactic antibiotic doxycycline 200 mg as a one-time dose. Discussed prevention with insect repellent before he goes outside  Wardell Honour MD

## 2015-07-15 ENCOUNTER — Encounter: Payer: Self-pay | Admitting: *Deleted

## 2015-07-15 ENCOUNTER — Other Ambulatory Visit: Payer: Self-pay | Admitting: *Deleted

## 2015-07-15 NOTE — Patient Outreach (Signed)
Transition of care call #5 - final. Pt is doing well. Denies any problematic cardiac sxs. He is much stronger. He will see his cardiology mid June. Pt does share with me his Advanced Directives decisions. I have encouraged him to follow his health maintenance recommendations. I have also encouraged him to call me with any furture questions or problems, I would be glad to be of assistance.  Deloria Lair [facility] [phone]

## 2015-07-17 DIAGNOSIS — H35443 Age-related reticular degeneration of retina, bilateral: Secondary | ICD-10-CM | POA: Diagnosis not present

## 2015-07-17 DIAGNOSIS — Z961 Presence of intraocular lens: Secondary | ICD-10-CM | POA: Diagnosis not present

## 2015-07-17 DIAGNOSIS — H43813 Vitreous degeneration, bilateral: Secondary | ICD-10-CM | POA: Diagnosis not present

## 2015-07-17 DIAGNOSIS — H04123 Dry eye syndrome of bilateral lacrimal glands: Secondary | ICD-10-CM | POA: Diagnosis not present

## 2015-07-21 ENCOUNTER — Encounter: Payer: Self-pay | Admitting: Family Medicine

## 2015-07-24 ENCOUNTER — Telehealth: Payer: Self-pay | Admitting: Cardiology

## 2015-07-24 NOTE — Telephone Encounter (Signed)
New Message  Pt requested to speak w/ RN- wanted to know if he can start back on med for his bladder control- even though he is also on Diltiazem. Please call back and discuss.

## 2015-07-24 NOTE — Telephone Encounter (Signed)
Returned call to pt. He said said he has been experiencing severe bladder leakage since stopping alfusozin on 06/26/15. Pt is concerned that he should not take it along with his diltiazem. Pt called Dr Ralene Muskrat office to see if he could start the medication back and they told him to call us to check with [place] first. It sounds like the pt was initially concerned about this interaction back in April and initiated the discontinuation of alfuzosin in April.  This information taken to Pharmacist, Erasmo Downer. She explained that the alfuzosin may potentiate the affect of the diltiazem by causing hypotensive episodes.  However, taken the pt is on a lower dose of diltiazem, he should be ok to take both medications. Please educate the pt on signs of hypotension.  Called pt back and gave him the pharmacist's recommendation and that he needed to call Dr Jeffie Pollock back for the Rx. Also, educated him on signs of hypotension and to call his doctor or call 911 if he has any symptoms of concern. Pt verbalized understanding.

## 2015-07-29 ENCOUNTER — Encounter (INDEPENDENT_AMBULATORY_CARE_PROVIDER_SITE_OTHER): Payer: Medicare Other | Admitting: Family Medicine

## 2015-07-29 DIAGNOSIS — J449 Chronic obstructive pulmonary disease, unspecified: Secondary | ICD-10-CM | POA: Diagnosis not present

## 2015-07-29 DIAGNOSIS — I2510 Atherosclerotic heart disease of native coronary artery without angina pectoris: Secondary | ICD-10-CM | POA: Diagnosis not present

## 2015-07-29 DIAGNOSIS — I4891 Unspecified atrial fibrillation: Secondary | ICD-10-CM

## 2015-07-29 DIAGNOSIS — J189 Pneumonia, unspecified organism: Secondary | ICD-10-CM | POA: Diagnosis not present

## 2015-08-03 ENCOUNTER — Other Ambulatory Visit: Payer: Self-pay | Admitting: Endocrinology

## 2015-08-11 ENCOUNTER — Other Ambulatory Visit: Payer: Self-pay | Admitting: *Deleted

## 2015-08-11 MED ORDER — DILTIAZEM HCL ER COATED BEADS 120 MG PO CP24: 120.0000 mg | ORAL_CAPSULE | Freq: Every day | ORAL | Status: DC

## 2015-08-19 ENCOUNTER — Ambulatory Visit (INDEPENDENT_AMBULATORY_CARE_PROVIDER_SITE_OTHER): Payer: Medicare Other | Admitting: Cardiology

## 2015-08-19 ENCOUNTER — Encounter: Payer: Self-pay | Admitting: Cardiology

## 2015-08-19 VITALS — BP 112/62 | HR 64 | Ht 67.0 in | Wt 166.0 lb

## 2015-08-19 DIAGNOSIS — I481 Persistent atrial fibrillation: Secondary | ICD-10-CM | POA: Diagnosis not present

## 2015-08-19 DIAGNOSIS — I4819 Other persistent atrial fibrillation: Secondary | ICD-10-CM

## 2015-08-19 NOTE — Progress Notes (Signed)
HPI The patient presents for followup of his known coronary disease. He had a negative stress perfusion study prior to hip surgery in 2013. Since then he's had a bladder surgery.  He was in the hospital in April and I reviewed these records.  He had PAF with RVR.  He converted to NSR.  He was not started on anticoagulation because of UGIB in the past and recent hematuria.  He did have an elevated troponin.  However, he had no evidence of ischemia on Lexiscan Myoview.    Since going home he's had a few palpitations but they seem to have improved and they were not this sustained arrhythmia that was his fibrillation. He cares for his wife who has multiple medical problems. He does have some mild shortness of breath with activities but this has been chronic. He's not describing resting shortness of breath, PND or orthopnea. He is not describing presyncope or syncope. He has no weight gain or edema.  Allergies  Allergen Reactions  . Betadine [Povidone Iodine]     "Skin blisters"  . Lortab [Hydrocodone-Acetaminophen] Nausea And Vomiting  . Penicillins Other (See Comments)    Light headed Has patient had a PCN reaction causing immediate rash, facial/tongue/throat swelling, SOB or lightheadedness with hypotension: Yes Has patient had a PCN reaction causing severe rash involving mucus membranes or skin necrosis: No Has patient had a PCN reaction that required hospitalization No Has patient had a PCN reaction occurring within the last 10 years: No If all of the above answers are "NO", then may proceed with Cephalosporin use.  Marland Kitchen Zetia [Ezetimibe] Other (See Comments)    Muscle weakness  . Zocor [Simvastatin] Nausea Only and Other (See Comments)    muscle weakness    Current Outpatient Prescriptions  Medication Sig Dispense Refill  . albuterol (PROVENTIL HFA;VENTOLIN HFA) 108 (90 BASE) MCG/ACT inhaler Inhale 2 puffs into the lungs every 6 (six) hours as needed for wheezing or shortness of breath.      . alfuzosin (UROXATRAL) 10 MG 24 hr tablet Take 10 mg by mouth daily with breakfast.     . arformoterol (BROVANA) 15 MCG/2ML NEBU Take 2 mLs (15 mcg total) by nebulization 2 (two) times daily. 120 mL 0  . bisacodyl (DULCOLAX) 10 MG suppository Place 10 mg rectally as needed for moderate constipation.    . bromocriptine (PARLODEL) 2.5 MG tablet TAKE  (1)  TABLET TWICE A DAY. 60 tablet 2  . calcium carbonate (OS-CAL) 600 MG TABS tablet Take 600 mg by mouth 2 (two) times daily with a meal.    . cholecalciferol (VITAMIN D) 1000 UNITS tablet Take 1,000 Units by mouth 2 (two) times daily.    . cyanocobalamin 100 MCG tablet Take 100 mcg by mouth daily.    Marland Kitchen diltiazem (CARDIZEM CD) 120 MG 24 hr capsule Take 1 capsule (120 mg total) by mouth daily. 30 capsule 1  . ferrous sulfate 325 (65 FE) MG tablet Take 325 mg by mouth daily.     Marland Kitchen guaiFENesin (MUCINEX) 600 MG 12 hr tablet Take 600 mg by mouth 2 (two) times daily.     Marland Kitchen levothyroxine (SYNTHROID, LEVOTHROID) 50 MCG tablet TAKE 1 TABLET IN MORNING 30 tablet 0  . magnesium oxide (MAG-OX) 400 MG tablet Take 400 mg by mouth daily.    . montelukast (SINGULAIR) 10 MG tablet TAKE 1 TABLET DAILY (Patient taking differently: TAKE 1 TABLET DAILY AS NEEDED FOR SEASONAL ALLERGIES) 30 tablet 3  . nitroGLYCERIN (NITROSTAT) 0.4  MG SL tablet Place 0.4 mg under the tongue every 5 (five) minutes as needed for chest pain. Reported on 06/24/2015    . pantoprazole (PROTONIX) 40 MG tablet Take 1 tablet (40 mg total) by mouth 2 (two) times daily before a meal . 60 tablet 3  . polyethylene glycol (MIRALAX / GLYCOLAX) packet Take 17 g by mouth daily. (Patient taking differently: Take 17 g by mouth daily as needed for mild constipation. ) 14 each 0  . sucralfate (CARAFATE) 1 g tablet Take 1 tablet (1 g total) by mouth 4 (four) times daily -  with meals and at bedtime. As needed (Patient taking differently: Take 1 g by mouth at bedtime. *may take up to 4 times daily* *dissolves  tablet*) 120 tablet 2  . SYMBICORT 160-4.5 MCG/ACT inhaler Inhale 2 puffs into the lungs 2 (two) times daily. 10.2 g 1  . traMADol-acetaminophen (ULTRACET) 37.5-325 MG tablet Take 1 tablet by mouth every 6 (six) hours as needed. (Patient taking differently: Take 1 tablet by mouth every 6 (six) hours as needed for moderate pain or severe pain. ) 12 tablet 0  . vitamin E (VITAMIN E) 400 UNIT capsule Take 400 Units by mouth daily.    Mariane Baumgarten Calcium (STOOL SOFTENER PO) Take 1 tablet by mouth 2 (two) times daily as needed (constipation).    Marland Kitchen doxycycline (VIBRA-TABS) 100 MG tablet Take 2 tablets as a one-time dose 2 tablet 0  . tetrahydrozoline (VISINE) 0.05 % ophthalmic solution Place 1 drop into both eyes as needed (For dry eyes).     No current facility-administered medications for this visit.    Past Medical History  Diagnosis Date  . Hypothyroidism   . Anemia   . Blood transfusion june 2011, 5 units given    july 2011 some units given  . Enlarged prostate     elevated psa recently  . GERD (gastroesophageal reflux disease)   . Arthritis   . Dizziness     occasional  . Mass, scrotum     L scrotum - Sees Dr. Jeffie Pollock  . Abnormality of gait 06/07/2013  . Pituitary macroadenoma ([place])   . Restless legs syndrome (RLS)   . Asthma   . Cataract     bilateral removal of cateracts  . Depression   . Complication of anesthesia      no issues,but pt prefers spinal due to Pulmonary problems  . CAD (coronary artery disease) 03/2006    3.0 x 20 mm TAXUS Perseus DES to the LAD; 01/2007  L main 30%, oLAD 50%, pLAD stent ok, CFX 80%, OM 60%, pRCA 60%, mRCA 70%, oPDA 90%; med rx   . Pneumonia     No recent.  . Emphysema   . COPD (chronic obstructive pulmonary disease) ([place])     oxygen  on standby in home.  . Enlarged prostate with urinary retention   . Transfusion history     history 4 yrs ago.   Marland Kitchen UGIB (upper gastrointestinal bleed) 03/2013    EGD w/ large ulcer at GE junction  . UGIB (upper  gastrointestinal bleed) 08/09/2009    At GE junction, rx w/ endoclips  . UGIB (upper gastrointestinal bleed) 09/2009    just above GE junction, rx  w/ 2 endoclips    Past Surgical History  Procedure Laterality Date  . Coronary stent placement  03/2006    3.0 x 20 mm TAXUS Perseus DES to the LAD  . Hiatal hernia repair  01-04-2008  .  Foot surgery  1994 left, 2002 right foot    bilateral foot reconstruciton  . Abdominal hernia repair   2008  . Cataract extraction both eyes    . Total hip arthroplasty  07/21/2011    Procedure: TOTAL HIP ARTHROPLASTY ANTERIOR APPROACH;  Surgeon: Mauri Pole, MD;  Location: [facility];  Service: Orthopedics;  Laterality: Left;  . Cardiac catheterization  01/2007    L main 30%, oLAD 50%,  pLAD stent ok, CFX 80%, OM 60%, pRCA 60%, mRCA 70%, oPDA 90%; med rx  . Total hip arthroplasty Right 09/07/2012    Procedure: RIGHT TOTAL HIP ARTHROPLASTY ANTERIOR APPROACH;  Surgeon: Mcarthur Rossetti, MD;  Location: [facility];  Service: Orthopedics;  Laterality: Right;  . Foot surgery      reconstruction of both feet- no retained hardware.  . Esophagogastroduodenoscopy N/A 04/20/2013    Procedure: ESOPHAGOGASTRODUODENOSCOPY (EGD);  Surgeon: Inda Castle, MD;  Location: [facility];  Service: Endoscopy;  Laterality: N/A;  . Colonoscopy    . Upper gastrointestinal endoscopy  12/2013    Dr Hilarie Fredrickson, gastritis  . Cystoscopy with insertion of urolift N/A 06/01/2015    Procedure: CYSTOSCOPY WITH INSERTION OF UROLIFT x4;  Surgeon: Irine Seal, MD;  Location: [facility];  Service: Urology;  Laterality: N/A;  . Cystoscopy N/A 06/10/2015    Procedure: CYSTOSCOPY FULGRATION OF BLEEDING,  electovapor resection;  Surgeon: Irine Seal, MD;  Location: [facility];  Service: Urology;  Laterality: N/A;    ROS: Positive for dizziness and balance problems, Constipation.  Otherwise as stated in the HPI and negative for all other systems.  PHYSICAL EXAM BP 112/62 mmHg  Pulse 64  Ht 5\' 7"  (1.702 m)   Wt 166 lb (75.297 kg)  BMI 25.99 kg/m2 GENERAL:  Well appearing, But getting slightly increasingly refill HEENT:  Pupils equal round and reactive, fundi not visualized, oral mucosa unremarkable NECK:  No jugular venous distention, waveform within normal limits, carotid upstroke brisk and symmetric, no bruits, no thyromegaly LYMPHATICS:  No cervical, inguinal adenopathy LUNGS:  Clear to auscultation bilaterally BACK:  No CVA tenderness CHEST:  Unremarkable HEART:  PMI not displaced or sustained,S1 and S2 within normal limits, no S3, no S4, no clicks, no rubs, no murmurs ABD:  Flat, positive bowel sounds normal in frequency in pitch, no bruits, no rebound, no guarding, no midline pulsatile mass, no hepatomegaly, no splenomegaly EXT:  2 plus pulses upper, decreased DP/PT bilateral, mild edema, no cyanosis no clubbing  EKG:  Sinus rhythm, RSR' V1,  rate 64, axis within normal limits, intervals within normal limits, no acute ST-T wave changes. 08/19/2015  ASSESSMENT AND PLAN  CORONARY ARTERY DISEASE -  The patient has no new sypmtoms.  No further cardiovascular testing is indicated.  We will continue with aggressive risk reduction and meds as listed.  ATRIAL FIB - He has not had any symptomatic recurrence of this and he was pretty clear that he knew when it had started. He is clearly high risk systemic anticoagulation other than what he is taking. He does have low blood pressures but tolerates the Cardizem and he will continue on this. Therefore, no change in therapy is indicated.  HYPERLIPIDEMIA -  This is followed by Clois Dupes, MD who watches this closely.  Hospital records reviewed from April.

## 2015-08-19 NOTE — Patient Instructions (Signed)
Medication Instructions:  The current medical regimen is effective;  continue present plan and medications.  Follow-Up: Follow up with Dr Percival Spanish in [place] in 3 months.  If you need a refill on your cardiac medications before your next appointment, please call your pharmacy.  Thank you for choosing [facility]!!

## 2015-08-20 NOTE — Addendum Note (Signed)
Addended by: Freada Bergeron on: 08/20/2015 05:12 PM   Modules accepted: Orders

## 2015-09-14 ENCOUNTER — Other Ambulatory Visit: Payer: Self-pay | Admitting: Endocrinology

## 2015-09-18 ENCOUNTER — Ambulatory Visit (INDEPENDENT_AMBULATORY_CARE_PROVIDER_SITE_OTHER): Payer: Medicare Other | Admitting: Urology

## 2015-09-18 DIAGNOSIS — N401 Enlarged prostate with lower urinary tract symptoms: Secondary | ICD-10-CM

## 2015-09-18 DIAGNOSIS — N320 Bladder-neck obstruction: Secondary | ICD-10-CM | POA: Diagnosis not present

## 2015-09-18 DIAGNOSIS — R351 Nocturia: Secondary | ICD-10-CM

## 2015-09-22 ENCOUNTER — Encounter: Payer: Self-pay | Admitting: Cardiology

## 2015-09-28 ENCOUNTER — Telehealth: Payer: Self-pay | Admitting: Internal Medicine

## 2015-09-28 NOTE — Telephone Encounter (Signed)
Patient Scheduled for an OV this Wed 09-30-15 2pm

## 2015-09-29 ENCOUNTER — Encounter: Payer: Self-pay | Admitting: Family Medicine

## 2015-09-29 ENCOUNTER — Ambulatory Visit (INDEPENDENT_AMBULATORY_CARE_PROVIDER_SITE_OTHER): Payer: Medicare Other | Admitting: Family Medicine

## 2015-09-29 VITALS — BP 103/63 | HR 64 | Temp 97.0°F | Ht 67.0 in | Wt 163.0 lb

## 2015-09-29 DIAGNOSIS — D72829 Elevated white blood cell count, unspecified: Secondary | ICD-10-CM | POA: Diagnosis not present

## 2015-09-29 DIAGNOSIS — N400 Enlarged prostate without lower urinary tract symptoms: Secondary | ICD-10-CM | POA: Diagnosis not present

## 2015-09-29 DIAGNOSIS — D649 Anemia, unspecified: Secondary | ICD-10-CM

## 2015-09-29 DIAGNOSIS — E785 Hyperlipidemia, unspecified: Secondary | ICD-10-CM | POA: Diagnosis not present

## 2015-09-29 DIAGNOSIS — E538 Deficiency of other specified B group vitamins: Secondary | ICD-10-CM

## 2015-09-29 DIAGNOSIS — I4891 Unspecified atrial fibrillation: Secondary | ICD-10-CM | POA: Diagnosis not present

## 2015-09-29 DIAGNOSIS — G629 Polyneuropathy, unspecified: Secondary | ICD-10-CM

## 2015-09-29 DIAGNOSIS — E038 Other specified hypothyroidism: Secondary | ICD-10-CM | POA: Diagnosis not present

## 2015-09-29 DIAGNOSIS — E034 Atrophy of thyroid (acquired): Secondary | ICD-10-CM

## 2015-09-29 NOTE — Patient Instructions (Addendum)
Continue current medications. Continue good therapeutic lifestyle changes which include good diet and exercise. Fall precautions discussed with patient. If an FOBT was given today- please return it to our front desk. If you are over 80 years old - you may need Prevnar 47 or the adult Pneumonia vaccine.   After your visit with [place] today you will receive a survey in the mail or online from [employer] regarding your care with [place]. Please take a moment to fill this out. Your feedback is very important to [place] as you can help [place] better understand your patient needs as well as improve your experience and satisfaction. WE CARE ABOUT YOU!!!                        Medicare Annual Wellness Visit  [facility] and the medical providers at [facility] strive to bring you the best medical care.  In doing so we not only want to address your current medical conditions and concerns but also to detect new conditions early and prevent illness, disease and health-related problems.    Medicare offers a yearly Wellness Visit which allows our clinical staff to assess your need for preventative services including immunizations, lifestyle education, counseling to decrease risk of preventable diseases and screening for fall risk and other medical concerns.    This visit is provided free of charge (no copay) for all Medicare recipients. The clinical pharmacists at [facility] have begun to conduct these Wellness Visits which will also include a thorough review of all your medications.    As you primary medical provider recommend that you make an appointment for your Annual Wellness Visit if you have not done so already this year.  You may set up this appointment before you leave today or you may call back [phone] and schedule an appointment.  Please make sure when you call that you mention that you are scheduling your Annual Wellness Visit with the clinical pharmacist so that the  appointment may be made for the proper length of time.    Follow-up with gastroenterology and cardiology and urology as planned The patient should remain careful and not put himself at risk for falling Stay well hydrated and drink plenty of fluids

## 2015-09-29 NOTE — Progress Notes (Addendum)
Subjective:    Patient ID: [name], male    DOB: June 09, 1932, 82 y.o.   MRN: [id number]  HPI   Patient is here today for followup of his chronic medical conditions including COPD, hyperlipidemia, hypothyroidism and anemia.The patient has a somewhat complex history. He has a history of atrial fib COPD coronary artery disease esophageal ulcer pituitary adenoma and hypothyroidism. He's had several surgeries recently on his prostate gland and is now planning for third surgery due to scar tissue and inability to empty easily. He has gotten approval for the surgery from his cardiologist with using spinal anesthesia. He has an appointment tomorrow with the gastroenterologist because he has had frequent GI bleeds and he is not been taking his PPI and Carafate as regularly as he should. He's been having some esophageal discomfort but no GI bleeding or black tarry stools. So he is going to try to take the Carafate more regularly and stay on the PPI. He will discuss all this with his gastroenterologist tomorrow when he visits with him. The prostate surgery is going to be done soon. He has no nausea vomiting but does have some constipation and is currently taking a stool softener and MiraLAX but still is constipated with this. He is alert and relating his history well to make. He is stressed because he has his wife at home and they have just recently decided not to pursue dialysis at her request because of declining kidney function. He is dealing with a lot.    Review of Systems  Constitutional: Positive for fatigue.  HENT: Negative.   Eyes: Negative.   Respiratory: Negative.   Cardiovascular: Negative.   Gastrointestinal: Positive for abdominal pain and constipation.  Endocrine: Negative.   Genitourinary: Positive for decreased urine volume, difficulty urinating and dysuria.  Musculoskeletal: Positive for gait problem.  Skin: Negative.        Callus left heel  Allergic/Immunologic: Negative.     Neurological: Positive for weakness.  Hematological: Negative.   Psychiatric/Behavioral: Negative.   All other systems reviewed and are negative.      Objective:   Physical Exam  Constitutional: He is oriented to person, place, and time. He appears well-developed and well-nourished. He appears distressed.  The patient is calm and alert.  HENT:  Head: Normocephalic and atraumatic.  Right Ear: External ear normal.  Left Ear: External ear normal.  Nose: Nose normal.  Mouth/Throat: Oropharynx is clear and moist. No oropharyngeal exudate.  Eyes: Conjunctivae and EOM are normal. Pupils are equal, round, and reactive to light. Right eye exhibits no discharge. Left eye exhibits no discharge. No scleral icterus.  Neck: Normal range of motion. Neck supple. No thyromegaly present.  Cardiovascular: Normal rate, regular rhythm and normal heart sounds.   No murmur heard. The heart has a regular rate and rhythm at 72/m  Pulmonary/Chest: Effort normal and breath sounds normal. No respiratory distress. He has no wheezes. He has no rales. He exhibits no tenderness.  Clear anteriorly and posteriorly. No wheezes or rhonchi. He continues to use his nebulizer treatment but only every other day but he continues on with the Symbicort as 2 puffs twice daily.  Abdominal: Soft. Bowel sounds are normal. He exhibits no mass. There is no tenderness. There is no rebound and no guarding.  Musculoskeletal: Normal range of motion. He exhibits no edema or tenderness.  Patient uses a cane for ambulation as very flat footed and has a callus on the heel of his left foot. He has  inserts in both shoes.  Lymphadenopathy:    He has no cervical adenopathy.  Neurological: He is alert and oriented to person, place, and time.  Skin: Skin is warm and dry. No rash noted.  Psychiatric: He has a normal mood and affect. His behavior is normal. Judgment and thought content normal.  Nursing note and vitals reviewed.    BP 103/63  (BP Location: Left Arm, Patient Position: Sitting, Cuff Size: Normal)   Pulse 64   Temp 97 F (36.1 C) (Oral)   Ht '5\' 7"'$  (1.702 m)   Wt 163 lb (73.9 kg)   BMI 25.53 kg/m            Assessment & Plan:  1. Hypothyroidism due to acquired atrophy of thyroid -Continue current treatment pending results of lab work - CBC with Differential/Platelet - BMP8+EGFR - Lipid panel - Hepatic function panel - VITAMIN D 25 Hydroxy (Vit-D Deficiency, Fractures) - PSA, total and free - Urinalysis, Complete - Thyroid Panel With TSH  2. BPH (benign prostatic hyperplasia) -Follow-up with urology as planned - CBC with Differential/Platelet - BMP8+EGFR - Lipid panel - Hepatic function panel - VITAMIN D 25 Hydroxy (Vit-D Deficiency, Fractures) - PSA, total and free - Urinalysis, Complete - Thyroid Panel With TSH  3. Hyperlipidemia LDL goal <70 -Continue current treatment with as aggressive therapeutic lifestyle changes as possible - CBC with Differential/Platelet - BMP8+EGFR - Lipid panel - Hepatic function panel - VITAMIN D 25 Hydroxy (Vit-D Deficiency, Fractures) - PSA, total and free - Urinalysis, Complete - Thyroid Panel With TSH  4. ANEMIA -Follow-up with gastroenterologist and stay on Carafate and proton pump inhibitor - CBC with Differential/Platelet - BMP8+EGFR - Lipid panel - Hepatic function panel - VITAMIN D 25 Hydroxy (Vit-D Deficiency, Fractures) - PSA, total and free - Urinalysis, Complete - Thyroid Panel With TSH  5. Leukocytosis - CBC with Differential/Platelet - BMP8+EGFR - Lipid panel - Hepatic function panel - VITAMIN D 25 Hydroxy (Vit-D Deficiency, Fractures) - PSA, total and free - Urinalysis, Complete - Thyroid Panel With TSH  6. Atrial fibrillation with RVR ([place]) -Follow-up with cardiology as planned  7. Vitamin B 12 deficiency -Check B12 level today  8. Neuropathy ([place]) -Be careful and not put himself at risk for falling and use cane around  the house and walker if necessary Patient Instructions  Continue current medications. Continue good therapeutic lifestyle changes which include good diet and exercise. Fall precautions discussed with patient. If an FOBT was given today- please return it to our front desk. If you are over 58 years old - you may need Prevnar 27 or the adult Pneumonia vaccine.   After your visit with [place] today you will receive a survey in the mail or online from [employer] regarding your care with [place]. Please take a moment to fill this out. Your feedback is very important to [place] as you can help [place] better understand your patient needs as well as improve your experience and satisfaction. WE CARE ABOUT YOU!!!                        Medicare Annual Wellness Visit  [facility] and the medical providers at [facility] strive to bring you the best medical care.  In doing so we not only want to address your current medical conditions and concerns but also to detect new conditions early and prevent illness, disease and health-related problems.    Medicare offers a yearly  Wellness Visit which allows our clinical staff to assess your need for preventative services including immunizations, lifestyle education, counseling to decrease risk of preventable diseases and screening for fall risk and other medical concerns.    This visit is provided free of charge (no copay) for all Medicare recipients. The clinical pharmacists at [facility] have begun to conduct these Wellness Visits which will also include a thorough review of all your medications.    As you primary medical provider recommend that you make an appointment for your Annual Wellness Visit if you have not done so already this year.  You may set up this appointment before you leave today or you may call back ([phone]) and schedule an appointment.  Please make sure when you call that you mention that you are scheduling your  Annual Wellness Visit with the clinical pharmacist so that the appointment may be made for the proper length of time.    Follow-up with gastroenterology and cardiology and urology as planned The patient should remain careful and not put himself at risk for falling Stay well hydrated and drink plenty of fluids   Arrie Senate MD

## 2015-09-30 ENCOUNTER — Encounter: Payer: Self-pay | Admitting: Internal Medicine

## 2015-09-30 ENCOUNTER — Telehealth: Payer: Self-pay | Admitting: Cardiology

## 2015-09-30 ENCOUNTER — Ambulatory Visit (INDEPENDENT_AMBULATORY_CARE_PROVIDER_SITE_OTHER): Payer: Medicare Other | Admitting: Internal Medicine

## 2015-09-30 VITALS — BP 130/70 | HR 76 | Ht 67.0 in | Wt 163.0 lb

## 2015-09-30 DIAGNOSIS — K5900 Constipation, unspecified: Secondary | ICD-10-CM

## 2015-09-30 DIAGNOSIS — K219 Gastro-esophageal reflux disease without esophagitis: Secondary | ICD-10-CM

## 2015-09-30 DIAGNOSIS — R1013 Epigastric pain: Secondary | ICD-10-CM

## 2015-09-30 DIAGNOSIS — K2270 Barrett's esophagus without dysplasia: Secondary | ICD-10-CM

## 2015-09-30 LAB — CBC WITH DIFFERENTIAL/PLATELET
Basophils Absolute: 0.1 10*3/uL (ref 0.0–0.2)
Basos: 1 %
EOS (ABSOLUTE): 0.3 10*3/uL (ref 0.0–0.4)
Eos: 4 %
Hematocrit: 38.1 % (ref 37.5–51.0)
Hemoglobin: 12.6 g/dL (ref 12.6–17.7)
Immature Grans (Abs): 0.2 10*3/uL — ABNORMAL HIGH (ref 0.0–0.1)
Immature Granulocytes: 3 %
Lymphocytes Absolute: 1.5 10*3/uL (ref 0.7–3.1)
Lymphs: 21 %
MCH: 31.5 pg (ref 26.6–33.0)
MCHC: 33.1 g/dL (ref 31.5–35.7)
MCV: 95 fL (ref 79–97)
Monocytes Absolute: 0.8 10*3/uL (ref 0.1–0.9)
Monocytes: 11 %
Neutrophils Absolute: 4.1 10*3/uL (ref 1.4–7.0)
Neutrophils: 60 %
Platelets: 218 10*3/uL (ref 150–379)
RBC: 4 x10E6/uL — ABNORMAL LOW (ref 4.14–5.80)
RDW: 14.8 % (ref 12.3–15.4)
WBC: 6.9 10*3/uL (ref 3.4–10.8)

## 2015-09-30 LAB — VITAMIN D 25 HYDROXY (VIT D DEFICIENCY, FRACTURES): Vit D, 25-Hydroxy: 39.9 ng/mL (ref 30.0–100.0)

## 2015-09-30 LAB — LIPID PANEL
Chol/HDL Ratio: 3.4 ratio units (ref 0.0–5.0)
Cholesterol, Total: 181 mg/dL (ref 100–199)
HDL: 54 mg/dL (ref >39)
LDL Calculated: 107 mg/dL — ABNORMAL HIGH (ref 0–99)
Triglycerides: 98 mg/dL (ref 0–149)
VLDL Cholesterol Cal: 20 mg/dL (ref 5–40)

## 2015-09-30 LAB — HEPATIC FUNCTION PANEL
ALT: 12 IU/L (ref 0–44)
AST: 22 IU/L (ref 0–40)
Albumin: 4 g/dL (ref 3.5–4.7)
Alkaline Phosphatase: 85 IU/L (ref 39–117)
Bilirubin Total: 0.2 mg/dL (ref 0.0–1.2)
Bilirubin, Direct: 0.08 mg/dL (ref 0.00–0.40)
Total Protein: 6.7 g/dL (ref 6.0–8.5)

## 2015-09-30 LAB — THYROID PANEL WITH TSH
Free Thyroxine Index: 1.9 (ref 1.2–4.9)
T3 Uptake Ratio: 28 % (ref 24–39)
T4, Total: 6.9 ug/dL (ref 4.5–12.0)
TSH: 2.58 u[IU]/mL (ref 0.450–4.500)

## 2015-09-30 LAB — BMP8+EGFR
BUN/Creatinine Ratio: 15 (ref 10–24)
BUN: 15 mg/dL (ref 8–27)
CO2: 24 mmol/L (ref 18–29)
Calcium: 8.9 mg/dL (ref 8.6–10.2)
Chloride: 103 mmol/L (ref 96–106)
Creatinine, Ser: 1.01 mg/dL (ref 0.76–1.27)
GFR calc Af Amer: 80 mL/min/{1.73_m2} (ref >59)
GFR calc non Af Amer: 69 mL/min/{1.73_m2} (ref >59)
Glucose: 128 mg/dL — ABNORMAL HIGH (ref 65–99)
Potassium: 4.2 mmol/L (ref 3.5–5.2)
Sodium: 140 mmol/L (ref 134–144)

## 2015-09-30 LAB — PSA, TOTAL AND FREE
PSA, Free Pct: 8.5 %
PSA, Free: 0.69 ng/mL
Prostate Specific Ag, Serum: 8.1 ng/mL — ABNORMAL HIGH (ref 0.0–4.0)

## 2015-09-30 MED ORDER — PANTOPRAZOLE SODIUM 40 MG PO TBEC: DELAYED_RELEASE_TABLET | ORAL | 3 refills | Status: DC

## 2015-09-30 MED ORDER — SUCRALFATE 1 G PO TABS: ORAL_TABLET | ORAL | 3 refills | Status: DC

## 2015-09-30 NOTE — Telephone Encounter (Signed)
The patient has no high risk findings. He had stress testing that was negative earlier this year.  He has had no new symptoms.  He is not going for a high risk study and the procedure is not high risk from a cardiovascular standpoint.  Therefore, based on ACC/AHA guidelines, the patient would be at acceptable risk for the planned procedure without further cardiovascular testing.

## 2015-09-30 NOTE — Telephone Encounter (Signed)
New message        Calling to see if we received clearance for prostate surgery from Dr Noland Fordyce office at [facility]?  Pt said Dr Percival Spanish said it would be ok.  [facility] was supposed to contact us.  Please call

## 2015-09-30 NOTE — Progress Notes (Signed)
Subjective:    Patient ID: [name], male    DOB: Oct 29, 1932, 82 y.o.   MRN: [id number]  HPI [name] is an 82 year old male with a history of GERD with esophageal ulceration, Barrett's esophagus, hiatal hernia status post repair with recurrence of hiatus hernia who is seen for follow-up. He is here today with his son-in-law. He also has a history of pituitary tumor on bromocriptine, normal pressure hydrocephalus, CAD and COPD. He reports that recently he has developed difficulty with swallowing pills and also intermittent epigastric pain with eating. This is associated with hiccups, belching and nausea. Despite this he reports a good appetite and denies early satiety. He is not vomiting. His epigastric pain has been episodic and without definitive trigger. He denies dysphagia to solids or liquids but does have trouble with pills. He had back down to pantoprazole once daily and had discontinued Carafate but recently since symptoms have returned he's now taking pantoprazole 40 mg twice a day before meals and Carafate tablets which he is making into a slurry twice a day. He's also noticed some increase in constipation. This is despite MiraLAX every other day and a stool softener. He's having a bowel movement about every 3 days. He's had issues with prostate and also had Urolift performed for BPH. Apparently this resulted in a bladder injury due to a staple which required repeat intervention.  Since his last office visit he had an upper endoscopy which was performed by me on 01/27/2014. This showed an ulcerated stricture 34 cm from the incisors. This was dilated with the scope in and biopsy. There is no definitive Barrett's seen endoscopically. There was a 4 cm hiatal hernia. Erosive gastritis in the gastric body and a normal examined duodenum. The esophageal stricture biopsy showed intestinal metaplasia with Barrett's esophagus. There was ulceration and fibrinopurulent material but no dysplasia or  cancer. Gastric biopsies were benign with chronic active gastritis. There was no H. pylori or metaplasia seen.  Review of Systems As per history of present illness, otherwise negative  Current Medications, Allergies, Past Medical History, Past Surgical History, Family History and Social History were reviewed in [employer] record.     Objective:   Physical Exam BP 130/70   Pulse 76   Ht 5\' 7"  (1.702 m)   Wt 163 lb (73.9 kg)   BMI 25.53 kg/m  Constitutional: Well-developed and well-nourished, elderly appearing. No distress. HEENT: Normocephalic and atraumatic. Oropharynx is clear and moist. No oropharyngeal exudate. Conjunctivae are normal.  No scleral icterus. Neck: Neck supple. Trachea midline. Cardiovascular: Normal rate, regular rhythm and intact distal pulses. No M/R/G Pulmonary/chest: Effort normal and breath sounds normal. No wheezing, rales or rhonchi. Abdominal: Soft, nontender, nondistended. Bowel sounds active throughout. There are no masses palpable. No hepatosplenomegaly. Extremities: no clubbing, cyanosis, or edema Lymphadenopathy: No cervical adenopathy noted. Neurological: Alert and oriented to person place and time. Skin: Skin is warm and dry. No rashes noted. Psychiatric: Normal mood and affect. Behavior is normal.   CBC yesterday showed normal hemoglobin at 12.6, hematocrit 38.1, MCV 95, platelet count 218, WBC 6.9     Component Value Date/Time   NA 140 09/29/2015 1453   K 4.2 09/29/2015 1453   CL 103 09/29/2015 1453   CO2 24 09/29/2015 1453   GLUCOSE 128 (H) 09/29/2015 1453   GLUCOSE 91 06/14/2015 0307   BUN 15 09/29/2015 1453   CREATININE 1.01 09/29/2015 1453   CALCIUM 8.9 09/29/2015 1453   PROT 6.7 09/29/2015  1453   ALBUMIN 4.0 09/29/2015 1453   AST 22 09/29/2015 1453   ALT 12 09/29/2015 1453   ALKPHOS 85 09/29/2015 1453   BILITOT 0.2 09/29/2015 1453   GFRNONAA 69 09/29/2015 1453   GFRAA 80 09/29/2015 1453         Assessment & Plan:  82 year old male with a history of GERD with esophageal ulceration, Barrett's esophagus, hiatal hernia status post repair with recurrence of hiatus hernia who is seen for follow-up.  1. GERD/epigastric pain/history of Barrett's esophagus and esophageal ulceration/history of gastritis -- his symptoms have worsened over the last several months. I agree with the pantoprazole 40 mg twice daily before meals. I also agree with reinitiation of Carafate slurry which can be used 2-4 times daily before meals and at bedtime. We reviewed the GERD diet. I recommended that we repeat the upper endoscopy given his history of stricture with esophagitis and Barrett's, mild dysphagia, epigastric pain. This will be performed in the outpatient hospital setting given his history of COPD with rare home oxygen use. We discussed the nature of the procedure including the risks, benefits and alternatives and he wishes to proceed. If symptoms worsen prior to the procedure he is asked to notify me. He voices understanding.  2. Constipation -- increase MiraLAX to 17 g daily. Continue stool softener for now. His stools to loose, back off stool softener before backing off MiraLAX. Notify me if not improving  His wife [name], who is also a patient, has become increasingly ill over the last few months. It was felt that she would need dialysis again but she and her family made the decision not to resume dialysis. He feels that she is nearing the end of her life and he continues to be her primary caregiver at home. I let him know that they are in my thoughts and prayers and to let her know that I am thinking of her.  25 minutes spent with the patient today. Greater than 50% was spent in counseling and coordination of care with the patient

## 2015-09-30 NOTE — Telephone Encounter (Signed)
Sent to Dr Alyson Ingles via [employer]

## 2015-09-30 NOTE — Patient Instructions (Signed)
You have been scheduled for an endoscopy. Please follow written instructions given to you at your visit today. If you use inhalers (even only as needed), please bring them with you on the day of your procedure. Your physician has requested that you go to www.startemmi.com and enter the access code given to you at your visit today. This web site gives a general overview about your procedure. However, you should still follow specific instructions given to you by our office regarding your preparation for the procedure.  We have sent the following medications to your pharmacy for you to pick up at your convenience: Pantoprazole twice daily Sucralfate- Take 1 tablet (dissolved in water) 2-4 times daily  Increase your miralax to 1 capful daily dosing.  Continue Stool softeners.  If you are age 80 or older, your body mass index should be between 23-30. Your Body mass index is 25.53 kg/m. If this is out of the aforementioned range listed, please consider follow up with your Primary Care Provider.  If you are age 70 or younger, your body mass index should be between 19-25. Your Body mass index is 25.53 kg/m. If this is out of the aformentioned range listed, please consider follow up with your Primary Care Provider.

## 2015-09-30 NOTE — Telephone Encounter (Signed)
Spoke with patient and advised clearance received  Request for surgical clearance:  1. What type of surgery is being performed? Prostate surgery   2. When is this surgery scheduled? TBD  3. Are there any medications that need to be held prior to surgery and how long?    4. Name of physician performing surgery? Dr Alyson Ingles   5. What is your office phone and fax number? Phone [phone] fax [phone]   Will forward to Dr Percival Spanish for review

## 2015-10-01 ENCOUNTER — Other Ambulatory Visit: Payer: Medicare Other

## 2015-10-01 ENCOUNTER — Encounter: Payer: Medicare Other | Admitting: Internal Medicine

## 2015-10-01 DIAGNOSIS — Z1211 Encounter for screening for malignant neoplasm of colon: Secondary | ICD-10-CM | POA: Diagnosis not present

## 2015-10-02 ENCOUNTER — Encounter (HOSPITAL_COMMUNITY): Payer: Self-pay | Admitting: *Deleted

## 2015-10-02 LAB — FECAL OCCULT BLOOD, IMMUNOCHEMICAL: Fecal Occult Bld: NEGATIVE

## 2015-10-02 NOTE — Progress Notes (Signed)
Spoke with dr Marcell Barlow made awre chest xray results 06-12-15, do not need to repeat chest xray results per dr Marcell Barlow anesthesia

## 2015-10-08 ENCOUNTER — Other Ambulatory Visit: Payer: Self-pay

## 2015-10-08 DIAGNOSIS — N320 Bladder-neck obstruction: Secondary | ICD-10-CM

## 2015-10-12 ENCOUNTER — Other Ambulatory Visit: Payer: Self-pay | Admitting: Endocrinology

## 2015-10-12 ENCOUNTER — Other Ambulatory Visit: Payer: Self-pay | Admitting: Family Medicine

## 2015-10-13 ENCOUNTER — Encounter (HOSPITAL_COMMUNITY)
Admission: RE | Disposition: A | Discharge status: Home / Self Care | Payer: Self-pay | Source: Ambulatory Visit | Attending: Internal Medicine

## 2015-10-13 ENCOUNTER — Ambulatory Visit (HOSPITAL_COMMUNITY): Payer: Medicare Other | Admitting: Certified Registered Nurse Anesthetist

## 2015-10-13 ENCOUNTER — Ambulatory Visit (HOSPITAL_COMMUNITY)
Admission: RE | Admit: 2015-10-13 | Discharge: 2015-10-13 | Disposition: A | Discharge status: Home / Self Care | Payer: Medicare Other | Source: Ambulatory Visit | Attending: Internal Medicine | Admitting: Internal Medicine

## 2015-10-13 ENCOUNTER — Encounter (HOSPITAL_COMMUNITY): Payer: Self-pay | Admitting: Anesthesiology

## 2015-10-13 DIAGNOSIS — Z79899 Other long term (current) drug therapy: Secondary | ICD-10-CM | POA: Insufficient documentation

## 2015-10-13 DIAGNOSIS — K5900 Constipation, unspecified: Secondary | ICD-10-CM | POA: Diagnosis not present

## 2015-10-13 DIAGNOSIS — I4891 Unspecified atrial fibrillation: Secondary | ICD-10-CM | POA: Insufficient documentation

## 2015-10-13 DIAGNOSIS — I2510 Atherosclerotic heart disease of native coronary artery without angina pectoris: Secondary | ICD-10-CM | POA: Diagnosis not present

## 2015-10-13 DIAGNOSIS — J45909 Unspecified asthma, uncomplicated: Secondary | ICD-10-CM | POA: Insufficient documentation

## 2015-10-13 DIAGNOSIS — D649 Anemia, unspecified: Secondary | ICD-10-CM | POA: Diagnosis not present

## 2015-10-13 DIAGNOSIS — K3189 Other diseases of stomach and duodenum: Secondary | ICD-10-CM | POA: Diagnosis not present

## 2015-10-13 DIAGNOSIS — K2940 Chronic atrophic gastritis without bleeding: Secondary | ICD-10-CM | POA: Insufficient documentation

## 2015-10-13 DIAGNOSIS — J449 Chronic obstructive pulmonary disease, unspecified: Secondary | ICD-10-CM | POA: Insufficient documentation

## 2015-10-13 DIAGNOSIS — R1013 Epigastric pain: Secondary | ICD-10-CM | POA: Diagnosis not present

## 2015-10-13 DIAGNOSIS — K219 Gastro-esophageal reflux disease without esophagitis: Secondary | ICD-10-CM | POA: Diagnosis not present

## 2015-10-13 DIAGNOSIS — Z96642 Presence of left artificial hip joint: Secondary | ICD-10-CM | POA: Diagnosis not present

## 2015-10-13 DIAGNOSIS — N400 Enlarged prostate without lower urinary tract symptoms: Secondary | ICD-10-CM | POA: Insufficient documentation

## 2015-10-13 DIAGNOSIS — K2960 Other gastritis without bleeding: Secondary | ICD-10-CM | POA: Diagnosis not present

## 2015-10-13 DIAGNOSIS — Z955 Presence of coronary angioplasty implant and graft: Secondary | ICD-10-CM | POA: Insufficient documentation

## 2015-10-13 DIAGNOSIS — K2950 Unspecified chronic gastritis without bleeding: Secondary | ICD-10-CM | POA: Diagnosis not present

## 2015-10-13 DIAGNOSIS — K222 Esophageal obstruction: Secondary | ICD-10-CM | POA: Insufficient documentation

## 2015-10-13 DIAGNOSIS — K449 Diaphragmatic hernia without obstruction or gangrene: Secondary | ICD-10-CM | POA: Diagnosis not present

## 2015-10-13 DIAGNOSIS — E039 Hypothyroidism, unspecified: Secondary | ICD-10-CM | POA: Insufficient documentation

## 2015-10-13 DIAGNOSIS — R1310 Dysphagia, unspecified: Secondary | ICD-10-CM

## 2015-10-13 HISTORY — PX: ESOPHAGOGASTRODUODENOSCOPY (EGD) WITH PROPOFOL: SHX5813

## 2015-10-13 HISTORY — DX: Unspecified atrial fibrillation: I48.91

## 2015-10-13 HISTORY — DX: Malignant (primary) neoplasm, unspecified: C80.1

## 2015-10-13 SURGERY — ESOPHAGOGASTRODUODENOSCOPY (EGD) WITH PROPOFOL
Anesthesia: Monitor Anesthesia Care

## 2015-10-13 MED ORDER — LACTATED RINGERS IV SOLN
INTRAVENOUS | Status: DC | PRN
  Administered 2015-10-13: 11:00:00 via INTRAVENOUS

## 2015-10-13 MED ORDER — PROPOFOL 10 MG/ML IV BOLUS
INTRAVENOUS | Status: AC
  Filled 2015-10-13: qty 20

## 2015-10-13 MED ORDER — PROPOFOL 10 MG/ML IV BOLUS
INTRAVENOUS | Status: DC | PRN
  Administered 2015-10-13: 10 mg via INTRAVENOUS
  Administered 2015-10-13 (×2): 20 mg via INTRAVENOUS
  Administered 2015-10-13: 10 mg via INTRAVENOUS
  Administered 2015-10-13: 20 mg via INTRAVENOUS
  Administered 2015-10-13: 10 mg via INTRAVENOUS
  Administered 2015-10-13: 20 mg via INTRAVENOUS
  Administered 2015-10-13: 10 mg via INTRAVENOUS

## 2015-10-13 MED ORDER — SODIUM CHLORIDE 0.9 % IV SOLN: INTRAVENOUS | Status: DC

## 2015-10-13 SURGICAL SUPPLY — 14 items

## 2015-10-13 NOTE — H&P (View-Only) (Signed)
Subjective:    Patient ID: [name], male    DOB: February 18, 1933, 82 y.o.   MRN: [id number]  HPI [name] is an 82 year old male with a history of GERD with esophageal ulceration, Barrett's esophagus, hiatal hernia status post repair with recurrence of hiatus hernia who is seen for follow-up. He is here today with his son-in-law. He also has a history of pituitary tumor on bromocriptine, normal pressure hydrocephalus, CAD and COPD. He reports that recently he has developed difficulty with swallowing pills and also intermittent epigastric pain with eating. This is associated with hiccups, belching and nausea. Despite this he reports a good appetite and denies early satiety. He is not vomiting. His epigastric pain has been episodic and without definitive trigger. He denies dysphagia to solids or liquids but does have trouble with pills. He had back down to pantoprazole once daily and had discontinued Carafate but recently since symptoms have returned he's now taking pantoprazole 40 mg twice a day before meals and Carafate tablets which he is making into a slurry twice a day. He's also noticed some increase in constipation. This is despite MiraLAX every other day and a stool softener. He's having a bowel movement about every 3 days. He's had issues with prostate and also had Urolift performed for BPH. Apparently this resulted in a bladder injury due to a staple which required repeat intervention.  Since his last office visit he had an upper endoscopy which was performed by me on 01/27/2014. This showed an ulcerated stricture 34 cm from the incisors. This was dilated with the scope in and biopsy. There is no definitive Barrett's seen endoscopically. There was a 4 cm hiatal hernia. Erosive gastritis in the gastric body and a normal examined duodenum. The esophageal stricture biopsy showed intestinal metaplasia with Barrett's esophagus. There was ulceration and fibrinopurulent material but no dysplasia or  cancer. Gastric biopsies were benign with chronic active gastritis. There was no H. pylori or metaplasia seen.  Review of Systems As per history of present illness, otherwise negative  Current Medications, Allergies, Past Medical History, Past Surgical History, Family History and Social History were reviewed in [employer] record.     Objective:   Physical Exam BP 130/70   Pulse 76   Ht 5\' 7"  (1.702 m)   Wt 163 lb (73.9 kg)   BMI 25.53 kg/m  Constitutional: Well-developed and well-nourished, elderly appearing. No distress. HEENT: Normocephalic and atraumatic. Oropharynx is clear and moist. No oropharyngeal exudate. Conjunctivae are normal.  No scleral icterus. Neck: Neck supple. Trachea midline. Cardiovascular: Normal rate, regular rhythm and intact distal pulses. No M/R/G Pulmonary/chest: Effort normal and breath sounds normal. No wheezing, rales or rhonchi. Abdominal: Soft, nontender, nondistended. Bowel sounds active throughout. There are no masses palpable. No hepatosplenomegaly. Extremities: no clubbing, cyanosis, or edema Lymphadenopathy: No cervical adenopathy noted. Neurological: Alert and oriented to person place and time. Skin: Skin is warm and dry. No rashes noted. Psychiatric: Normal mood and affect. Behavior is normal.   CBC yesterday showed normal hemoglobin at 12.6, hematocrit 38.1, MCV 95, platelet count 218, WBC 6.9     Component Value Date/Time   NA 140 09/29/2015 1453   K 4.2 09/29/2015 1453   CL 103 09/29/2015 1453   CO2 24 09/29/2015 1453   GLUCOSE 128 (H) 09/29/2015 1453   GLUCOSE 91 06/14/2015 0307   BUN 15 09/29/2015 1453   CREATININE 1.01 09/29/2015 1453   CALCIUM 8.9 09/29/2015 1453   PROT 6.7 09/29/2015  1453   ALBUMIN 4.0 09/29/2015 1453   AST 22 09/29/2015 1453   ALT 12 09/29/2015 1453   ALKPHOS 85 09/29/2015 1453   BILITOT 0.2 09/29/2015 1453   GFRNONAA 69 09/29/2015 1453   GFRAA 80 09/29/2015 1453         Assessment & Plan:  81 year old male with a history of GERD with esophageal ulceration, Barrett's esophagus, hiatal hernia status post repair with recurrence of hiatus hernia who is seen for follow-up.  1. GERD/epigastric pain/history of Barrett's esophagus and esophageal ulceration/history of gastritis -- his symptoms have worsened over the last several months. I agree with the pantoprazole 40 mg twice daily before meals. I also agree with reinitiation of Carafate slurry which can be used 2-4 times daily before meals and at bedtime. We reviewed the GERD diet. I recommended that we repeat the upper endoscopy given his history of stricture with esophagitis and Barrett's, mild dysphagia, epigastric pain. This will be performed in the outpatient hospital setting given his history of COPD with rare home oxygen use. We discussed the nature of the procedure including the risks, benefits and alternatives and he wishes to proceed. If symptoms worsen prior to the procedure he is asked to notify me. He voices understanding.  2. Constipation -- increase MiraLAX to 17 g daily. Continue stool softener for now. His stools to loose, back off stool softener before backing off MiraLAX. Notify me if not improving  His wife [name], who is also a patient, has become increasingly ill over the last few months. It was felt that she would need dialysis again but she and her family made the decision not to resume dialysis. He feels that she is nearing the end of her life and he continues to be her primary caregiver at home. I let him know that they are in my thoughts and prayers and to let her know that I am thinking of her.  25 minutes spent with the patient today. Greater than 50% was spent in counseling and coordination of care with the patient

## 2015-10-13 NOTE — Discharge Instructions (Signed)

## 2015-10-13 NOTE — Anesthesia Procedure Notes (Signed)
Procedure Name: MAC Date/Time: 10/13/2015 11:25 AM Performed by: West Pugh Pre-anesthesia Checklist: Patient identified, Timeout performed, Emergency Drugs available, Suction available and Patient being monitored Patient Re-evaluated:Patient Re-evaluated prior to inductionOxygen Delivery Method: Nasal cannula Placement Confirmation: positive ETCO2 and CO2 detector Dental Injury: Teeth and Oropharynx as per pre-operative assessment

## 2015-10-13 NOTE — Op Note (Signed)
[facility] [place] Patient Name: [name] Procedure Date: 10/13/2015 MRN: [id number] Attending MD: Jerene Bears , MD Date of Birth: May 27, 1932 CSN: [id number] Age: 82 Admit Type: Outpatient Procedure:                Upper GI endoscopy Indications:              Epigastric abdominal pain, Dysphagia for pills,                            Gastro-esophageal reflux disease, Barrett's                            esophagus Providers:                Lajuan Lines. Hilarie Fredrickson, MD, Carolynn Comment, RN, Ralene Bathe, Technician Referring MD:              Medicines:                Monitored Anesthesia Care Complications:            No immediate complications. Estimated Blood Loss:     Estimated blood loss was minimal. Procedure:                Pre-Anesthesia Assessment:                           - Prior to the procedure, a History and Physical                            was performed, and patient medications and                            allergies were reviewed. The patient's tolerance of                            previous anesthesia was also reviewed. The risks                            and benefits of the procedure and the sedation                            options and risks were discussed with the patient.                            All questions were answered, and informed consent                            was obtained. Prior Anticoagulants: The patient has                            taken no previous anticoagulant or antiplatelet                            agents. ASA Grade Assessment: III -  A patient with                            severe systemic disease. After reviewing the risks                            and benefits, the patient was deemed in                            satisfactory condition to undergo the procedure.                           After obtaining informed consent, the endoscope was                            passed under direct vision.  Throughout the                            procedure, the patient's blood pressure, pulse, and                            oxygen saturations were monitored continuously. The                            [id number]) scope was introduced through the                            mouth, and advanced to the second part of duodenum.                            The upper GI endoscopy was accomplished without                            difficulty. The patient tolerated the procedure                            well. Scope In: Scope Out: Findings:      One moderate (circumferential scarring or stenosis; an endoscope may       pass) benign-appearing, intrinsic stenosis was found 38 cm from the       incisors. This measured 1 cm (in length) and was traversed. After       passing the Pentax adult upper endoscope a superficial mucosal tear was       evident at the stricture. There was minor bleeding which stopped       spontaneously (and for this reason further dilation was not performed).       The Z-line is irregular without nodularity or evidence of mass at 38 cm       from the incisors (at stricture). The previously seen esophageal       ulceration has healed.      A 4 cm hiatal hernia was present (38 to 42 cm).      Normal mucosa was found in the entire esophagus.      Diffuse atrophic mucosa was found in the gastric body. The mucosa was       friable.  Biopsied as below.      Minimal inflammation characterized by erythema was found in the gastric       antrum. Multiple biopsies were obtained with cold forceps for histology       and Helicobacter pylori testing in a targeted manner in the gastric       body, at the incisura and in the gastric antrum.      The examined duodenum was normal. Impression:               - Benign-appearing esophageal stenosis dilated with                            scope passage.                           - Irregular Z-line at 38 cm without nodularity or                             mass.                           - 4 cm hiatal hernia.                           - Atropic gastritis. Biopsied.                           - Normal examined duodenum. Moderate Sedation:      N/A Recommendation:           - Patient has a contact number available for                            emergencies. The signs and symptoms of potential                            delayed complications were discussed with the                            patient. Return to normal activities tomorrow.                            Written discharge instructions were provided to the                            patient.                           - Resume previous diet. Smaller more frequent meals                            is recommended with reflux precautions. Hiatal                            hernia likely contributing to upper GI symptoms.                           - Continue present  medications, including                            pantoprazole 40 mg twice daily and Carafate                            (sucralfate) slurry 1 gram twice to four times                            daily (best used before meals and at bedtime).                           - Await pathology results.                           - Office follow-up with me to assess                            response/improvement. Procedure Code(s):        --- Professional ---                           [phone], Esophagogastroduodenoscopy, flexible,                            transoral; with biopsy, single or multiple Diagnosis Code(s):        --- Professional ---                           K22.2, Esophageal obstruction                           K44.9, Diaphragmatic hernia without obstruction or                            gangrene                           K22.70, Barrett's esophagus without dysplasia                           R10.13, Epigastric pain                           R13.10, Dysphagia, unspecified                           K21.9,  Gastro-esophageal reflux disease without                            esophagitis CPT copyright 2016 [employer]. All rights reserved. The codes documented in this report are preliminary and upon coder review may  be revised to meet current compliance requirements. Jerene Bears, MD 10/13/2015 11:58:44 AM This report has been signed electronically. Number of Addenda: 0

## 2015-10-13 NOTE — Anesthesia Postprocedure Evaluation (Signed)
Anesthesia Post Note  Patient: [name]  Procedure(s) Performed: Procedure(s) (LRB): ESOPHAGOGASTRODUODENOSCOPY (EGD) WITH PROPOFOL (N/A)  Patient location during evaluation: PACU Anesthesia Type: MAC Level of consciousness: awake and alert and oriented Pain management: pain level controlled Vital Signs Assessment: post-procedure vital signs reviewed and stable Respiratory status: spontaneous breathing, nonlabored ventilation and respiratory function stable Cardiovascular status: blood pressure returned to baseline and stable Postop Assessment: no signs of nausea or vomiting Anesthetic complications: no    Last Vitals:  Vitals:   10/13/15 1115 10/13/15 1152  BP: 119/62 (!) 91/55  Pulse: (!) 58 (!) 56  Resp: 12 (!) 21  Temp: 36.4 C     Last Pain: There were no vitals filed for this visit.               [name] A.

## 2015-10-13 NOTE — Interval H&P Note (Signed)
History and Physical Interval Note: Patient presents for EGD, possible dilation. Seen very recently in the office  No significant changes in symptoms. Abdominal pain, particularly in the epigastrium, worsens after eating. Feels best just before eating when he is most hungry.  Dysphagia is primarily for pills and he denies solid food or liquid dysphagia  The nature of the procedure, as well as the risks, benefits, and alternatives were carefully and thoroughly reviewed with the patient. Ample time for discussion and questions allowed. The patient understood, was satisfied, and agreed to proceed.     10/13/2015 10:59 AM  [name]  has presented today for surgery, with the diagnosis of epigastric pain, GERD/dns  The various methods of treatment have been discussed with the patient and family. After consideration of risks, benefits and other options for treatment, the patient has consented to  Procedure(s): ESOPHAGOGASTRODUODENOSCOPY (EGD) WITH PROPOFOL (N/A) as a surgical intervention .  The patient's history has been reviewed, patient examined, no change in status, stable for surgery.  I have reviewed the patient's chart and labs.  Questions were answered to the patient's satisfaction.     Katelen Luepke M

## 2015-10-13 NOTE — Transfer of Care (Signed)
Immediate Anesthesia Transfer of Care Note  Patient: [name]  Procedure(s) Performed: Procedure(s): ESOPHAGOGASTRODUODENOSCOPY (EGD) WITH PROPOFOL (N/A)  Patient Location: PACU  Anesthesia Type:MAC  Level of Consciousness:  sedated, patient cooperative and responds to stimulation  Airway & Oxygen Therapy:Patient Spontanous Breathing and Patient connected to face mask oxgen  Post-op Assessment:  Report given to PACU RN and Post -op Vital signs reviewed and stable  Post vital signs:  Reviewed and stable  Last Vitals:  Vitals:   10/13/15 1115  BP: 119/62  Pulse: (!) 58  Resp: 12  Temp: A999333 C    Complications: No apparent anesthesia complications

## 2015-10-13 NOTE — Anesthesia Preprocedure Evaluation (Signed)
Anesthesia Evaluation  Patient identified by MRN, date of birth, ID band Patient awake    Reviewed: Allergy & Precautions, H&P , NPO status , Patient's Chart, lab work & pertinent test results, reviewed documented beta blocker date and time   History of Anesthesia Complications (+) history of anesthetic complications (no issues, but patient prefers spinal due to pulmonary problems)  Airway Mallampati: II  TM Distance: >3 FB Neck ROM: Full    Dental  (+) Teeth Intact, Dental Advisory Given   Pulmonary asthma , pneumonia, COPD,  COPD inhaler,    breath sounds clear to auscultation       Cardiovascular + angina + CAD and + Cardiac Stents  + dysrhythmias Atrial Fibrillation  Rhythm:Regular Rate:Normal  CAD, s/p PTCA w/ stent 2008 Currently asymptomatic left main had  a 20% stenosis.  Left anterior descending had 40-50% proximal   stenosis.  Left circumflex had an 80% proximal stenosis.  The  first obtuse marginal artery had a 60% mid stenosis.  The  right  coronary artery  had  a 50-60% proximal stenosis with a 60-70%  mid stenosis  The patient was managed medically.   12 lead EKG 07/2015- NSR   Neuro/Psych PSYCHIATRIC DISORDERS Depression Pituitary macroadenoma RLS negative neurological ROS     GI/Hepatic Neg liver ROS, PUD, GERD  Medicated,Epigastric pain   Endo/Other  Hypothyroidism Thyroid replacement Hyperlipidemia  Renal/GU negative Renal ROS  negative genitourinary   Musculoskeletal  (+) Arthritis , Osteoarthritis,  THR-left   Abdominal   Peds  Hematology  (+) Blood dyscrasia, anemia , Plt 213k   Anesthesia Other Findings   Reproductive/Obstetrics                             Anesthesia Physical  Anesthesia Plan  ASA: III  Anesthesia Plan: MAC   Post-op Pain Management:    Induction: Intravenous  Airway Management Planned: Natural Airway, Nasal Cannula and Simple Face  Mask  Additional Equipment:   Intra-op Plan:   Post-operative Plan:   Informed Consent: I have reviewed the patients History and Physical, chart, labs and discussed the procedure including the risks, benefits and alternatives for the proposed anesthesia with the patient or authorized representative who has indicated his/her understanding and acceptance.     Plan Discussed with: CRNA, Anesthesiologist and Surgeon  Anesthesia Plan Comments:         Anesthesia Quick Evaluation

## 2015-10-15 ENCOUNTER — Encounter (HOSPITAL_COMMUNITY): Payer: Self-pay | Admitting: Internal Medicine

## 2015-10-15 ENCOUNTER — Encounter: Payer: Self-pay | Admitting: Internal Medicine

## 2015-10-21 NOTE — Patient Instructions (Signed)
[name]  10/21/2015     @PREFPERIOPPHARMACY @   Your procedure is scheduled on 11/04/2015.  Report to [facility] at 10:45 A.M.  Call this number if you have problems the morning of surgery:  [phone]   Remember:  Do not eat food or drink liquids after midnight.  Take these medicines the morning of surgery with A SIP OF WATER Albuterol inhaler and bring with you, Uroxatrol, Brovana, Parlodel, Cyanocbalamin  Cardizem, Synthroid, Singulair, Protonix, Carafate, Ultracet   Do not wear jewelry, make-up or nail polish.  Do not wear lotions, powders, or perfumes, or deoderant.  Do not shave 48 hours prior to surgery.  Men may shave face and neck.  Do not bring valuables to the hospital.  [facility] is not responsible for any belongings or valuables.  Contacts, dentures or bridgework may not be worn into surgery.  Leave your suitcase in the car.  After surgery it may be brought to your room.  For patients admitted to the hospital, discharge time will be determined by your treatment team.  Patients discharged the day of surgery will not be allowed to drive home.    Please read over the following fact sheets that you were given. Surgical Site Infection Prevention and Anesthesia Post-op Instructions    PATIENT INSTRUCTIONS POST-ANESTHESIA  IMMEDIATELY FOLLOWING SURGERY:  Do not drive or operate machinery for the first twenty four hours after surgery.  Do not make any important decisions for twenty four hours after surgery or while taking narcotic pain medications or sedatives.  If you develop intractable nausea and vomiting or a severe headache please notify your doctor immediately.  FOLLOW-UP:  Please make an appointment with your surgeon as instructed. You do not need to follow up with anesthesia unless specifically instructed to do so.  WOUND CARE INSTRUCTIONS (if applicable):  Keep a dry clean dressing on the anesthesia/puncture wound site if there is drainage.  Once the  wound has quit draining you may leave it open to air.  Generally you should leave the bandage intact for twenty four hours unless there is drainage.  If the epidural site drains for more than 36-48 hours please call the anesthesia department.  QUESTIONS?:  Please feel free to call your physician or the hospital operator if you have any questions, and they will be happy to assist you.      Transurethral Resection, Bladder Tumor A cancerous growth (tumor) can develop on the inside wall of the bladder. The bladder is the organ that holds urine. One way to remove the tumor is a procedure called a transurethral resection. The tumor is removed (resected) through the tube that carries urine from the bladder out of the body (urethra). No cuts (incisions) are made in the skin. Instead, the procedure is done through a thin telescope, called a resectoscope. Attached to it is a light and usually a tiny camera. The resectoscope is put into the urethra. In men, the urethra opens at the end of the penis. In women, it opens just above the vagina.  A transurethral resection is usually used to remove tumors that have not gotten too big or too deep. These are called Stage 0, Stage 1 or Stage 2 bladder cancers. LET YOUR CAREGIVER KNOW ABOUT:  On the day of the procedure, your caregivers will need to know the last time you had anything to eat or drink. This includes water, gum, and candy. In advance, make sure they know about:   Any allergies.  All medications you are taking, including:  Herbs, eyedrops, over-the-counter medications and creams.  Blood thinners (anticoagulants), aspirin or other drugs that could affect blood clotting.  Use of steroids (by mouth or as creams).  Previous problems with anesthetics, including local anesthetics.  Possibility of pregnancy, if this applies.  Any history of blood clots.  Any history of bleeding or other blood problems.  Previous surgery.  Smoking history.  Any  recent symptoms of colds or infections.  Other health problems. RISKS AND COMPLICATIONS This is usually a safe procedure. Every procedure has risks, though. For a transurethral resection, they include:  Infection. Antibiotic medication would need to be taken.  Bleeding.  Light bleeding may last for several days after the procedure.  If bleeding continues or is heavy, the bladder may need rinsing. Or, a new catheter might be put in for awhile.  Sometimes bed rest is needed.  Urination problems.  Pain and burning can occur when urinating. This usually goes away in a few days.  Scarring from the procedure can block the flow of urine.  Bladder damage.  It can be punctured or torn during removal of the tumor. If this happens, a catheter might be needed for longer. Antibiotics would be taken while the bladder heals.  Urine can leak through the hole or tear into the abdomen. If this happens, surgery may be needed to repair the bladder. BEFORE THE PROCEDURE   A medical evaluation will be done. This may include:  A physical examination.  Urine test. This is to make sure you do not have a urinary tract infection.  Blood tests.  A test that checks the heart's rhythm (electrocardiogram).  Talking with an anesthesiologist. This is the person who will be in charge of the medication (anesthesia) to keep you from feeling pain during the transurethral resection. You might be asleep during the procedure (general anesthesia) or numb from the waist down, but awake during the procedure (spinal anesthesia). Ask your surgeon what to expect.  The person who is having a transurethral resection needs to give what is called informed consent. This requires signing a legal paper that gives permission for the procedure. To give informed consent:  You must understand how the procedure is done and why.  You must be told all the risks and benefits of the procedure.  You must sign the consent. Sometimes  a legal guardian can do this.  Signing should be witnessed by a healthcare professional.  The day before the surgery, eat only a light dinner. Then, do not eat or drink anything for at least 8 hours before the surgery. Ask your caregiver if it is OK to take any needed medicines with a sip of water.  Arrive at least an hour before the surgery or whenever your surgeon recommends. This will give you time to check in and fill out any needed paperwork. PROCEDURE  The preparation:  You will change into a hospital gown.  A needle will be inserted in your arm. This is an intravenous access tube (IV). Medication will be able to flow directly into your body through this needle.  Small monitors will be put on your body. They are used to check your heart, blood pressure, and oxygen level.  You might be given medication that will help you relax (sedative).  You will be given a general anesthetic or spinal anesthesia.  The procedure:  Once you are asleep or numb from the waist down, your legs will be placed in stirrups.  The resectoscope will be passed through the urethra into the bladder.  Fluid will be passed through the resectoscope. This will fill the bladder with water.  The surgeon will examine the bladder through the scope. If the scope has a camera, it can take pictures from inside the bladder. They can be projected onto a TV screen.  The surgeon will use various tools to remove the tumor in small pieces. Sometimes a laser (a beam of light energy) is used. Other tools may use electric current.  A tube (catheter) will often be placed so that urine can drain into a bag outside the body. This process helps stop bleeding. This tube keeps blood clots from blocking the urethra.  The procedure usually takes 30 to 45 minutes. AFTER THE PROCEDURE   You will stay in a recovery area until the anesthesia has worn off. Your blood pressure and pulse will be checked every so often. Then you will be  taken to a hospital room.  You may continue to get fluids through the IV for awhile.  Some pain is normal. The catheter might be uncomfortable. Pain is usually not severe. If it is, ask for pain medicine.  Your urine may look bloody after a transurethral resection. This is normal.  If bleeding is heavy, a hospital caregiver may rinse out the bladder (irrigation) through the catheter.  Once the urine is clear, the catheter will be taken out.  You will need to stay in the hospital until you can urinate on your own.  Most people stay in the hospital for up to 4 days. PROGNOSIS   Transurethral resection is considered the best way to treat bladder tumors that are not too far along. For most people, the treatment is successful. Sometimes, though, more treatment is needed.  Bladder cancers can come back even after a successful procedure. Because of this, be sure to have a checkup with your caregiver every 3 to 6 months. If everything is OK for 3 years, you can reduce the checkups to once a year.   This information is not intended to replace advice given to you by your health care provider. Make sure you discuss any questions you have with your health care provider.   Document Released: 12/11/2008 Document Revised: 05/09/2011 Document Reviewed: 02/16/2009 Elsevier Interactive Patient Education [employer].

## 2015-10-23 ENCOUNTER — Encounter (HOSPITAL_COMMUNITY)
Admission: RE | Admit: 2015-10-23 | Discharge: 2015-10-23 | Disposition: A | Discharge status: Home / Self Care | Payer: Medicare Other | Source: Ambulatory Visit | Attending: Family Medicine | Admitting: Family Medicine

## 2015-10-23 ENCOUNTER — Encounter (HOSPITAL_COMMUNITY)
Admission: RE | Admit: 2015-10-23 | Discharge: 2015-10-23 | Disposition: A | Discharge status: Home / Self Care | Payer: Medicare Other | Source: Ambulatory Visit | Attending: Urology | Admitting: Urology

## 2015-10-23 ENCOUNTER — Encounter (HOSPITAL_COMMUNITY): Payer: Self-pay

## 2015-10-23 DIAGNOSIS — Z01812 Encounter for preprocedural laboratory examination: Secondary | ICD-10-CM | POA: Diagnosis not present

## 2015-10-23 DIAGNOSIS — M810 Age-related osteoporosis without current pathological fracture: Secondary | ICD-10-CM | POA: Insufficient documentation

## 2015-10-23 HISTORY — DX: Polyneuropathy, unspecified: G62.9

## 2015-10-23 LAB — CBC WITH DIFFERENTIAL/PLATELET
Basophils Absolute: 0.1 10*3/uL (ref 0.0–0.1)
Basophils Relative: 1 %
Eosinophils Absolute: 0.2 10*3/uL (ref 0.0–0.7)
Eosinophils Relative: 2 %
HCT: 37.7 % — ABNORMAL LOW (ref 39.0–52.0)
Hemoglobin: 12.6 g/dL — ABNORMAL LOW (ref 13.0–17.0)
Lymphocytes Relative: 16 %
Lymphs Abs: 1.1 10*3/uL (ref 0.7–4.0)
MCH: 31.3 pg (ref 26.0–34.0)
MCHC: 33.4 g/dL (ref 30.0–36.0)
MCV: 93.5 fL (ref 78.0–100.0)
Monocytes Absolute: 0.8 10*3/uL (ref 0.1–1.0)
Monocytes Relative: 12 %
Neutro Abs: 4.5 10*3/uL (ref 1.7–7.7)
Neutrophils Relative %: 69 %
Platelets: 180 10*3/uL (ref 150–400)
RBC: 4.03 MIL/uL — ABNORMAL LOW (ref 4.22–5.81)
RDW: 14.4 % (ref 11.5–15.5)
WBC: 6.5 10*3/uL (ref 4.0–10.5)

## 2015-10-23 LAB — BASIC METABOLIC PANEL
Anion gap: 6 (ref 5–15)
BUN: 15 mg/dL (ref 6–20)
CO2: 25 mmol/L (ref 22–32)
Calcium: 9 mg/dL (ref 8.9–10.3)
Chloride: 107 mmol/L (ref 101–111)
Creatinine, Ser: 1.02 mg/dL (ref 0.61–1.24)
GFR calc Af Amer: >60 mL/min (ref >60)
GFR calc non Af Amer: >60 mL/min (ref >60)
Glucose, Bld: 119 mg/dL — ABNORMAL HIGH (ref 65–99)
Potassium: 3.9 mmol/L (ref 3.5–5.1)
Sodium: 138 mmol/L (ref 135–145)

## 2015-10-23 MED ORDER — SODIUM CHLORIDE 0.9 % IV SOLN
INTRAVENOUS | Status: DC
  Administered 2015-10-23: 10:00:00 via INTRAVENOUS

## 2015-10-23 MED ORDER — ZOLEDRONIC ACID 5 MG/100ML IV SOLN
5.0000 mg | Freq: Once | INTRAVENOUS | Status: AC
  Administered 2015-10-23: 5 mg via INTRAVENOUS
  Filled 2015-10-23: qty 100

## 2015-10-23 NOTE — Pre-Procedure Instructions (Signed)
Patient given information to sign up for my chart at home. 

## 2015-10-27 ENCOUNTER — Encounter: Payer: Self-pay | Admitting: Cardiology

## 2015-10-28 ENCOUNTER — Other Ambulatory Visit: Payer: Self-pay | Admitting: Pharmacist

## 2015-10-28 ENCOUNTER — Other Ambulatory Visit: Payer: Self-pay | Admitting: Family Medicine

## 2015-10-28 DIAGNOSIS — M810 Age-related osteoporosis without current pathological fracture: Secondary | ICD-10-CM

## 2015-10-30 ENCOUNTER — Other Ambulatory Visit (HOSPITAL_COMMUNITY): Payer: Medicare Other

## 2015-11-04 ENCOUNTER — Ambulatory Visit (HOSPITAL_COMMUNITY): Payer: Medicare Other | Admitting: Anesthesiology

## 2015-11-04 ENCOUNTER — Encounter (HOSPITAL_COMMUNITY)
Admission: RE | Disposition: A | Discharge status: Home / Self Care | Payer: Self-pay | Source: Ambulatory Visit | Attending: Urology

## 2015-11-04 ENCOUNTER — Encounter (HOSPITAL_COMMUNITY): Payer: Self-pay | Admitting: *Deleted

## 2015-11-04 ENCOUNTER — Ambulatory Visit (HOSPITAL_COMMUNITY)
Admission: RE | Admit: 2015-11-04 | Discharge: 2015-11-04 | Disposition: A | Discharge status: Home / Self Care | Payer: Medicare Other | Source: Ambulatory Visit | Attending: Urology | Admitting: Urology

## 2015-11-04 DIAGNOSIS — Z96643 Presence of artificial hip joint, bilateral: Secondary | ICD-10-CM | POA: Insufficient documentation

## 2015-11-04 DIAGNOSIS — E039 Hypothyroidism, unspecified: Secondary | ICD-10-CM | POA: Diagnosis not present

## 2015-11-04 DIAGNOSIS — M1990 Unspecified osteoarthritis, unspecified site: Secondary | ICD-10-CM | POA: Insufficient documentation

## 2015-11-04 DIAGNOSIS — N320 Bladder-neck obstruction: Secondary | ICD-10-CM | POA: Diagnosis not present

## 2015-11-04 DIAGNOSIS — K219 Gastro-esophageal reflux disease without esophagitis: Secondary | ICD-10-CM | POA: Insufficient documentation

## 2015-11-04 DIAGNOSIS — N400 Enlarged prostate without lower urinary tract symptoms: Secondary | ICD-10-CM | POA: Diagnosis not present

## 2015-11-04 DIAGNOSIS — I4891 Unspecified atrial fibrillation: Secondary | ICD-10-CM | POA: Insufficient documentation

## 2015-11-04 DIAGNOSIS — I2510 Atherosclerotic heart disease of native coronary artery without angina pectoris: Secondary | ICD-10-CM | POA: Insufficient documentation

## 2015-11-04 DIAGNOSIS — C61 Malignant neoplasm of prostate: Secondary | ICD-10-CM | POA: Diagnosis not present

## 2015-11-04 DIAGNOSIS — J449 Chronic obstructive pulmonary disease, unspecified: Secondary | ICD-10-CM | POA: Insufficient documentation

## 2015-11-04 DIAGNOSIS — Z955 Presence of coronary angioplasty implant and graft: Secondary | ICD-10-CM | POA: Diagnosis not present

## 2015-11-04 DIAGNOSIS — N401 Enlarged prostate with lower urinary tract symptoms: Secondary | ICD-10-CM

## 2015-11-04 DIAGNOSIS — N138 Other obstructive and reflux uropathy: Secondary | ICD-10-CM

## 2015-11-04 DIAGNOSIS — G2581 Restless legs syndrome: Secondary | ICD-10-CM | POA: Diagnosis not present

## 2015-11-04 HISTORY — PX: TRANSURETHRAL RESECTION OF PROSTATE: SHX73

## 2015-11-04 HISTORY — PX: TRANSURETHRAL RESECTION OF BLADDER NECK: SHX6196

## 2015-11-04 SURGERY — TURP (TRANSURETHRAL RESECTION OF PROSTATE)
Anesthesia: Spinal

## 2015-11-04 MED ORDER — LACTATED RINGERS IV SOLN
INTRAVENOUS | Status: DC
  Administered 2015-11-04: 11:00:00 via INTRAVENOUS

## 2015-11-04 MED ORDER — CEFAZOLIN SODIUM-DEXTROSE 2-4 GM/100ML-% IV SOLN
INTRAVENOUS | Status: AC
  Filled 2015-11-04: qty 100

## 2015-11-04 MED ORDER — CHLORHEXIDINE GLUCONATE CLOTH 2 % EX PADS: 6.0000 | MEDICATED_PAD | Freq: Once | CUTANEOUS | Status: DC

## 2015-11-04 MED ORDER — MIDAZOLAM HCL 2 MG/2ML IJ SOLN
1.0000 mg | INTRAMUSCULAR | Status: DC | PRN
  Administered 2015-11-04: 2 mg via INTRAVENOUS

## 2015-11-04 MED ORDER — EPHEDRINE SULFATE 50 MG/ML IJ SOLN
INTRAMUSCULAR | Status: AC
  Filled 2015-11-04: qty 1

## 2015-11-04 MED ORDER — MIDAZOLAM HCL 5 MG/5ML IJ SOLN
INTRAMUSCULAR | Status: DC | PRN
  Administered 2015-11-04 (×2): 1 mg via INTRAVENOUS

## 2015-11-04 MED ORDER — HYDROMORPHONE HCL 1 MG/ML IJ SOLN: 0.2500 mg | INTRAMUSCULAR | Status: DC | PRN

## 2015-11-04 MED ORDER — SODIUM CHLORIDE 0.9 % IR SOLN
Status: DC | PRN
  Administered 2015-11-04 (×2): 3000 mL

## 2015-11-04 MED ORDER — SODIUM CHLORIDE 0.9 % IJ SOLN
INTRAMUSCULAR | Status: AC
  Filled 2015-11-04: qty 10

## 2015-11-04 MED ORDER — TRAMADOL-ACETAMINOPHEN 37.5-325 MG PO TABS: 1.0000 | ORAL_TABLET | Freq: Four times a day (QID) | ORAL | 0 refills | Status: DC | PRN

## 2015-11-04 MED ORDER — BUPIVACAINE HCL (PF) 0.75 % IJ SOLN
INTRAMUSCULAR | Status: DC | PRN
  Administered 2015-11-04: 8 mg

## 2015-11-04 MED ORDER — MIDAZOLAM HCL 2 MG/2ML IJ SOLN
INTRAMUSCULAR | Status: AC
  Filled 2015-11-04: qty 2

## 2015-11-04 MED ORDER — PHENYLEPHRINE 40 MCG/ML (10ML) SYRINGE FOR IV PUSH (FOR BLOOD PRESSURE SUPPORT)
PREFILLED_SYRINGE | INTRAVENOUS | Status: AC
  Filled 2015-11-04: qty 10

## 2015-11-04 MED ORDER — CEFAZOLIN SODIUM-DEXTROSE 2-3 GM-% IV SOLR
INTRAVENOUS | Status: DC | PRN
  Administered 2015-11-04: 2 g via INTRAVENOUS

## 2015-11-04 MED ORDER — PROPOFOL 500 MG/50ML IV EMUL
INTRAVENOUS | Status: DC | PRN
  Administered 2015-11-04: 50 ug/kg/min via INTRAVENOUS

## 2015-11-04 SURGICAL SUPPLY — 27 items
BAG DRAIN URO TABLE W/ADPT NS (DRAPE) ×2 IMPLANT
BAG HAMPER (MISCELLANEOUS) ×2 IMPLANT
BAG URINE DRAIN TURP 4L (OSTOMY) ×4 IMPLANT
BAG URINE DRAINAGE (UROLOGICAL SUPPLIES) IMPLANT
CATH FOLEY 2WAY SLVR  5CC 22FR (CATHETERS)
CATH FOLEY 2WAY SLVR 30CC 20FR (CATHETERS) IMPLANT
CATH FOLEY 2WAY SLVR 30CC 22FR (CATHETERS) ×2 IMPLANT
CATH FOLEY 2WAY SLVR 5CC 22FR (CATHETERS) IMPLANT
CATH FOLEY 3WAY 30CC 22F (CATHETERS) IMPLANT
CATH FOLEY LATEX FREE 22FR (CATHETERS)
CATH FOLEY LF 22FR (CATHETERS) IMPLANT
CLOTH BEACON ORANGE TIMEOUT ST (SAFETY) ×2 IMPLANT
ELECT LOOP 22F BIPOLAR SML (ELECTROSURGICAL)
ELECT REM PT RETURN 9FT ADLT (ELECTROSURGICAL) ×2
ELECTRODE LOOP 22F BIPOLAR SML (ELECTROSURGICAL) IMPLANT
ELECTRODE REM PT RTRN 9FT ADLT (ELECTROSURGICAL) ×1 IMPLANT
EVACUATOR MICROVAS BLADDER (UROLOGICAL SUPPLIES) IMPLANT
GLOVE BIO SURGEON STRL SZ8 (GLOVE) ×2 IMPLANT
GOWN STRL REUS W/ TWL LRG LVL3 (GOWN DISPOSABLE) ×1 IMPLANT
GOWN STRL REUS W/TWL LRG LVL3 (GOWN DISPOSABLE) ×1
KIT ROOM TURNOVER AP CYSTO (KITS) IMPLANT
LOOP CUT BIPOLAR 24F LRG (ELECTROSURGICAL) ×2 IMPLANT
PACK CYSTO (CUSTOM PROCEDURE TRAY) ×2 IMPLANT
PAD ARMBOARD 7.5X6 YLW CONV (MISCELLANEOUS) ×2 IMPLANT
PLUG CATH AND CAP STER (CATHETERS) ×2 IMPLANT
SYR 20CC LL (SYRINGE) IMPLANT
SYRINGE IRR TOOMEY STRL 70CC (SYRINGE) IMPLANT

## 2015-11-04 NOTE — Transfer of Care (Signed)
Immediate Anesthesia Transfer of Care Note  Patient: [name]  Procedure(s) Performed: Procedure(s): TRANSURETHRAL RESECTION OF THE PROSTATE (TURP); REMOVAL OF UROLIFT IMPLANTS X THREE (N/A) RESECTION OF BLADDER NECK (N/A)  Patient Location: PACU  Anesthesia Type:MAC and Spinal  Level of Consciousness: awake, alert , oriented and patient cooperative  Airway & Oxygen Therapy: Patient Spontanous Breathing and Patient connected to nasal cannula oxygen  Post-op Assessment: Report given to RN and Post -op Vital signs reviewed and stable  Post vital signs: Reviewed and stable  Last Vitals:  Vitals:   11/04/15 1140 11/04/15 1145  BP: 111/62 109/61  Pulse:    Resp: 16 14  Temp:      Last Pain:  Vitals:   11/04/15 1100  TempSrc: Oral      Patients Stated Pain Goal: 5 ([id number])  Complications: No apparent anesthesia complications

## 2015-11-04 NOTE — Discharge Instructions (Signed)
Cystoscopy, Care After Refer to this sheet in the next few weeks. These instructions provide you with information on caring for yourself after your procedure. Your caregiver may also give you more specific instructions. Your treatment has been planned according to current medical practices, but problems sometimes occur. Call your caregiver if you have any problems or questions after your procedure. HOME CARE INSTRUCTIONS  Things you can do to ease any discomfort after your procedure include:  Drinking enough water and fluids to keep your urine clear or pale yellow.  Taking a warm bath to relieve any burning feelings. SEEK IMMEDIATE MEDICAL CARE IF:   You have an increase in blood in your urine.  You notice blood clots in your urine.  You have difficulty passing urine.  You have the chills.  You have abdominal pain.  You have a fever or persistent symptoms for more than 2-3 days.  You have a fever and your symptoms suddenly get worse. MAKE SURE YOU:   Understand these instructions.  Will watch your condition.  Will get help right away if you are not doing well or get worse.   This information is not intended to replace advice given to you by your health care provider. Make sure you discuss any questions you have with your health care provider.   Document Released: 09/03/2004 Document Revised: 03/07/2014 Document Reviewed: 08/08/2011 Elsevier Interactive Patient Education 2016 Else[employer] Inc[employer]    PATIENT INSTRUCTIONS POST-ANESTHESIA  IMMEDIATELY FOLLOWING SURGERY:  Do not drive or operate machinery for the first twenty four hours after surgery.  Do not make any important decisions for twenty four hours after surgery or while taking narcotic pain medications or sedatives.  If you develop intractable nausea and vomiting or a severe headache please notify your doctor immediately.  FOLLOW-UP:  Please make an appointment with your surgeon as instructed. You do not need to follow up  with anesthesia unless specifically instructed to do so.  WOUND CARE INSTRUCTIONS (if applicable):  Keep a dry clean dressing on the anesthesia/puncture wound site if there is drainage.  Once the wound has quit draining you may leave it open to air.  Generally you should leave the bandage intact for twenty four hours unless there is drainage.  If the epidural site drains for more than 36-48 hours please call the anesthesia department.  QUESTIONS?:  Please feel free to call your physician or the hospital operator if you have any questions, and they will be happy to assist you.       Foley Catheter Care, Adult A Foley catheter is a soft, flexible tube. This tube is placed into your bladder to drain pee (urine). If you go home with this catheter in place, follow the instructions below. TAKING CARE OF THE CATHETER 1. Wash your hands with soap and water. 2. Put soap and water on a clean washcloth.  Clean the skin where the tube goes into your body.  Clean away from the tube site.  Never wipe toward the tube.  Clean the area using a circular motion.  Remove all the soap. Pat the area dry with a clean towel. For males, reposition the skin that covers the end of the penis (foreskin). 3. Attach the tube to your leg with tape or a leg strap. Do not stretch the tube tight. If you are using tape, remove any stickiness left behind by past tape you used. 4. Keep the drainage bag below your hips. Keep it off the floor. 5. Check your tube  during the day. Make sure it is working and draining. Make sure the tube does not curl, twist, or bend. 6. Do not pull on the tube or try to take it out. TAKING CARE OF THE DRAINAGE BAGS You will have a large overnight drainage bag and a small leg bag. You may wear the overnight bag any time. Never wear the small bag at night. Follow the directions below. Emptying the Drainage Bag Empty your drainage bag when it is  - full or at least 2-3 times a day. 1. Wash your  hands with soap and water. 2. Keep the drainage bag below your hips. 3. Hold the dirty bag over the toilet or clean container. 4. Open the pour spout at the bottom of the bag. Empty the pee into the toilet or container. Do not let the pour spout touch anything. 5. Clean the pour spout with a gauze pad or cotton ball that has rubbing alcohol on it. 6. Close the pour spout. 7. Attach the bag to your leg with tape or a leg strap. 8. Wash your hands well. Changing the Drainage Bag Change your bag once a month or sooner if it starts to smell or look dirty.  1. Wash your hands with soap and water. 2. Pinch the rubber tube so that pee does not spill out. 3. Disconnect the catheter tube from the drainage tube at the connection valve. Do not let the tubes touch anything. 4. Clean the end of the catheter tube with an alcohol wipe. Clean the end of a the drainage tube with a different alcohol wipe. 5. Connect the catheter tube to the drainage tube of the clean drainage bag. 6. Attach the new bag to the leg with tape or a leg strap. Avoid attaching the new bag too tightly. 7. Wash your hands well. Cleaning the Drainage Bag 1. Wash your hands with soap and water. 2. Wash the bag in warm, soapy water. 3. Rinse the bag with warm water. 4. Fill the bag with a mixture of white vinegar and water (1 cup vinegar to 1 quart warm water [.2 liter vinegar to 1 liter warm water]). Close the bag and soak it for 30 minutes in the solution. 5. Rinse the bag with warm water. 6. Hang the bag to dry with the pour spout open and hanging downward. 7. Store the clean bag (once it is dry) in a clean plastic bag. 8. Wash your hands well. PREVENT INFECTION  Wash your hands before and after touching your tube.  Take showers every day. Wash the skin where the tube enters your body. Do not take baths. Replace wet leg straps with dry ones, if this applies.  Do not use powders, sprays, or lotions on the genital area. Only use  creams, lotions, or ointments as told by your doctor.  For females, wipe from front to back after going to the bathroom.  Drink enough fluids to keep your pee clear or pale yellow unless you are told not to have too much fluid (fluid restriction).  Do not let the drainage bag or tubing touch or lie on the floor.  Wear cotton underwear to keep the area dry. GET HELP IF:  Your pee is cloudy or smells unusually bad.  Your tube becomes clogged.  You are not draining pee into the bag or your bladder feels full.  Your tube starts to leak. GET HELP RIGHT AWAY IF:  You have pain, puffiness (swelling), redness, or yellowish-white fluid (pus) where  the tube enters the body.  You have pain in the belly (abdomen), legs, lower back, or bladder.  You have a fever.  You see blood fill the tube, or your pee is pink or red.  You feel sick to your stomach (nauseous), throw up (vomit), or have chills.  Your tube gets pulled out. MAKE SURE YOU:   Understand these instructions.  Will watch your condition.  Will get help right away if you are not doing well or get worse.   This information is not intended to replace advice given to you by your health care provider. Make sure you discuss any questions you have with your health care provider.   Document Released: 06/11/2012 Document Revised: 03/07/2014 Document Reviewed: 06/11/2012 Elsevier Interactive Patient Education 2016 [employer].   Spinal Anesthesia and Epidural Anesthesia, Care After Refer to this sheet in the next few weeks. These instructions provide you with information about caring for yourself after your procedure. Your health care provider may also give you more specific instructions. Your treatment has been planned according to current medical practices, but problems sometimes occur. Call your health care provider if you have any problems or questions after your procedure. WHAT TO EXPECT AFTER THE PROCEDURE After your  procedure, it is typical to have the following:  Sleepiness.  Nausea and vomiting. HOME CARE INSTRUCTIONS  For the first 24 hours after anesthetic medicine:  Do not drive or operate heavy machinery.  Do not drink alcohol.  Do not make important decisions.  Have someone stay with you for at least 24-48 hours.  Drink enough fluid to keep your urine clear or pale yellow. SEEK MEDICAL CARE IF:  You have nausea and vomiting that continue the day after anesthetic medicine was given.  You develop a rash. SEEK IMMEDIATE MEDICAL CARE IF:   You have a fever.  You have a persistent or severe headache.  You develop blurred or double vision.  You develop dizziness or lightheadedness.  You faint.  You have weakness, numbness, or tingling in your arms or legs.  You have difficulty breathing.  You are unable to pass urine.   This information is not intended to replace advice given to you by your health care provider. Make sure you discuss any questions you have with your health care provider.   Document Released: 05/07/2003 Document Revised: 03/07/2014 Document Reviewed: 09/25/2013 Elsevier Interactive Patient Education [employer].

## 2015-11-04 NOTE — Anesthesia Procedure Notes (Signed)
Procedure Name: MAC Date/Time: 11/04/2015 11:57 AM Performed by: Andree Elk, AMY A Pre-anesthesia Checklist: Patient identified, Timeout performed, Emergency Drugs available, Suction available and Patient being monitored Oxygen Delivery Method: Simple face mask

## 2015-11-04 NOTE — Anesthesia Postprocedure Evaluation (Signed)
Anesthesia Post Note  Patient: [name]  Procedure(s) Performed: Procedure(s) (LRB): TRANSURETHRAL RESECTION OF THE PROSTATE (TURP); REMOVAL OF UROLIFT IMPLANTS X THREE (N/A) RESECTION OF BLADDER NECK (N/A)  Patient location during evaluation: PACU Anesthesia Type: Spinal Level of consciousness: awake and alert and oriented Pain management: pain level controlled Vital Signs Assessment: post-procedure vital signs reviewed and stable Respiratory status: spontaneous breathing and patient connected to nasal cannula oxygen Cardiovascular status: stable Postop Assessment: no signs of nausea or vomiting Anesthetic complications: no    Last Vitals:  Vitals:   11/04/15 1145 11/04/15 1300  BP: 109/61 102/60  Pulse:  (!) 55  Resp: 14 19  Temp:  36.4 C    Last Pain:  Vitals:   11/04/15 1100  TempSrc: Oral                 ADAMS, AMY A

## 2015-11-04 NOTE — Brief Op Note (Signed)
11/04/2015  12:46 PM  PATIENT:  [name]  82 y.o. male  PRE-OPERATIVE DIAGNOSIS:  bladder neck contracture  POST-OPERATIVE DIAGNOSIS:  bladder neck contracture  PROCEDURE:  Procedure(s): TRANSURETHRAL RESECTION OF THE PROSTATE (TURP) (N/A) RESECTION OF BLADDER NECK (N/A)  SURGEON:  Surgeon(s) and Role:    * Cleon Gustin, MD - Primary  PHYSICIAN ASSISTANT:   ASSISTANTS: none   ANESTHESIA:   spinal  EBL:  Total I/O In: 700 [I.V.:700] Out: -   BLOOD ADMINISTERED:none  DRAINS: Urinary Catheter (Foley)   LOCAL MEDICATIONS USED:  NONE  SPECIMEN:  Source of Specimen:  prostate chips, urolift implants x 3  DISPOSITION OF SPECIMEN:  PATHOLOGY  COUNTS:  YES  TOURNIQUET:  * No tourniquets in log *  DICTATION: .Note written in EPIC  PLAN OF CARE: Discharge to home after PACU  PATIENT DISPOSITION:  PACU - hemodynamically stable.   Delay start of Pharmacological VTE agent (>24hrs) due to surgical blood loss or risk of bleeding: not applicable

## 2015-11-04 NOTE — Anesthesia Preprocedure Evaluation (Signed)
Anesthesia Evaluation  Patient identified by MRN, date of birth, ID band Patient awake    Reviewed: Allergy & Precautions, H&P , NPO status , Patient's Chart, lab work & pertinent test results, reviewed documented beta blocker date and time   History of Anesthesia Complications (+) history of anesthetic complications (no issues, but patient prefers spinal due to pulmonary problems)  Airway Mallampati: II  TM Distance: >3 FB Neck ROM: Full    Dental  (+) Teeth Intact, Dental Advisory Given   Pulmonary asthma , pneumonia, COPD,  COPD inhaler,    breath sounds clear to auscultation       Cardiovascular (-) angina+ CAD and + Cardiac Stents   Rhythm:Regular Rate:Normal  CAD, s/p PTCA w/ stent 2008 Currently asymptomatic    Neuro/Psych PSYCHIATRIC DISORDERS Depression Pituitary macroadenoma RLS negative neurological ROS     GI/Hepatic Neg liver ROS, PUD, GERD  Medicated,  Endo/Other  Hypothyroidism Thyroid replacement  Renal/GU negative Renal ROS  negative genitourinary   Musculoskeletal  (+) Arthritis , Osteoarthritis,  THR-left   Abdominal   Peds  Hematology  (+) Blood dyscrasia, anemia , Plt 213k   Anesthesia Other Findings   Reproductive/Obstetrics negative OB ROS                            Anesthesia Physical Anesthesia Plan  ASA: III  Anesthesia Plan: Spinal   Post-op Pain Management:    Induction:   Airway Management Planned: Simple Face Mask  Additional Equipment:   Intra-op Plan:   Post-operative Plan:   Informed Consent: I have reviewed the patients History and Physical, chart, labs and discussed the procedure including the risks, benefits and alternatives for the proposed anesthesia with the patient or authorized representative who has indicated his/her understanding and acceptance.     Plan Discussed with:   Anesthesia Plan Comments:         Anesthesia  Quick Evaluation

## 2015-11-04 NOTE — Anesthesia Procedure Notes (Signed)
Spinal  Patient location during procedure: OR Start time: 11/04/2015 12:16 PM Staffing Anesthesiologist: Lerry Liner Resident/CRNA: Yasaman Kolek A Preanesthetic Checklist Completed: patient identified, site marked, surgical consent, pre-op evaluation, timeout performed, IV checked, risks and benefits discussed and monitors and equipment checked Spinal Block Patient position: left lateral decubitus Prep: PCMX 3.3% Patient monitoring: heart rate, cardiac monitor, continuous pulse ox and blood pressure Approach: left paramedian Location: L3-4 Injection technique: single-shot Needle Needle type: Spinocan  Needle gauge: 22 G Needle length: 9 cm Assessment Sensory level: T8 Additional Notes  ATTEMPTS:2 TRAY [id number] [place] DATE:07-28-2016

## 2015-11-04 NOTE — H&P (Signed)
Urology Admission H&P  Chief Complaint: urinary urgency  History of Present Illness: Mr [name] is a 82yo with a hx of BPh s/p Urolift which was complicated by gross hematuria requiring clot evacuation and fulgeration. He developed a bladder neck contracture and is here for resection of his bladder enck  Past Medical History:  Diagnosis Date  . Abnormality of gait 06/07/2013  . Anemia   . Anginal pain ([facility])    hx of   . Arthritis   . Asthma   . Atrial fibrillation ([place])   . Blood transfusion june 2011, 5 units given   july 2011 some units given  . CAD (coronary artery disease) 03/2006   3.0 x 20 mm TAXUS Perseus DES to the LAD; 01/2007  L main 30%, oLAD 50%, pLAD stent ok, CFX 80%, OM 60%, pRCA 60%, mRCA 70%, oPDA 90%; med rx   . Cancer ([place])    skin cancer   . Cataract    bilateral removal of cateracts  . Complication of anesthesia     no issues,but pt prefers spinal due to Pulmonary problems  . COPD (chronic obstructive pulmonary disease) ([place])    oxygen  on standby in home.  . Depression   . Dizziness    occasional  . Emphysema   . Enlarged prostate    elevated psa recently  . Enlarged prostate with urinary retention   . GERD (gastroesophageal reflux disease)   . Hypothyroidism   . Mass, scrotum    L scrotum - Sees Dr. Jeffie Pollock  . Neuropathy ([place])   . Pituitary macroadenoma ([place])   . Pneumonia    No recent.  Marland Kitchen Restless legs syndrome (RLS)   . Transfusion history    history 4 yrs ago.   Marland Kitchen UGIB (upper gastrointestinal bleed) 03/2013   EGD w/ large ulcer at GE junction  . UGIB (upper gastrointestinal bleed) 08/09/2009   At GE junction, rx w/ endoclips  . UGIB (upper gastrointestinal bleed) 09/2009   just above GE junction, rx  w/ 2 endoclips   Past Surgical History:  Procedure Laterality Date  . ABDOMINAL HERNIA REPAIR   2008  . CARDIAC CATHETERIZATION  01/2007   L main 30%, oLAD 50%,  pLAD stent ok, CFX 80%, OM 60%, pRCA 60%, mRCA 70%, oPDA 90%; med rx  .  cataract extraction both eyes    . COLONOSCOPY    . CORONARY STENT PLACEMENT  03/2006   3.0 x 20 mm TAXUS Perseus DES to the LAD  . CYSTOSCOPY N/A 06/10/2015   Procedure: CYSTOSCOPY FULGRATION OF BLEEDING,  electovapor resection;  Surgeon: Irine Seal, MD;  Location: [facility];  Service: Urology;  Laterality: N/A;  . CYSTOSCOPY WITH INSERTION OF UROLIFT N/A 06/01/2015   Procedure: CYSTOSCOPY WITH INSERTION OF UROLIFT x4;  Surgeon: Irine Seal, MD;  Location: [facility];  Service: Urology;  Laterality: N/A;  . ESOPHAGOGASTRODUODENOSCOPY N/A 04/20/2013   Procedure: ESOPHAGOGASTRODUODENOSCOPY (EGD);  Surgeon: Inda Castle, MD;  Location: [facility];  Service: Endoscopy;  Laterality: N/A;  . ESOPHAGOGASTRODUODENOSCOPY (EGD) WITH PROPOFOL N/A 10/13/2015   Procedure: ESOPHAGOGASTRODUODENOSCOPY (EGD) WITH PROPOFOL;  Surgeon: Jerene Bears, MD;  Location: [facility];  Service: Gastroenterology;  Laterality: N/A;  . FOOT SURGERY  1994 left, 2002 right foot   bilateral foot reconstruciton  . FOOT SURGERY     reconstruction of both feet- no retained hardware.  Marland Kitchen HIATAL HERNIA REPAIR  01-04-2008  . PROSTATE SURGERY     x 2  . TOTAL HIP  ARTHROPLASTY  07/21/2011   Procedure: TOTAL HIP ARTHROPLASTY ANTERIOR APPROACH;  Surgeon: Mauri Pole, MD;  Location: [facility];  Service: Orthopedics;  Laterality: Left;  . TOTAL HIP ARTHROPLASTY Right 09/07/2012   Procedure: RIGHT TOTAL HIP ARTHROPLASTY ANTERIOR APPROACH;  Surgeon: Mcarthur Rossetti, MD;  Location: [facility];  Service: Orthopedics;  Laterality: Right;  . UPPER GASTROINTESTINAL ENDOSCOPY  12/2013   Dr Hilarie Fredrickson, gastritis    Home Medications:  Prescriptions Prior to Admission  Medication Sig Dispense Refill Last Dose  . albuterol (PROVENTIL HFA;VENTOLIN HFA) 108 (90 BASE) MCG/ACT inhaler Inhale 2 puffs into the lungs every 6 (six) hours as needed for wheezing or shortness of breath.   Past Week at Unknown time  . alfuzosin (UROXATRAL) 10 MG 24 hr tablet Take  10 mg by mouth every evening.    11/03/2015 at Unknown time  . arformoterol (BROVANA) 15 MCG/2ML NEBU Take 2 mLs (15 mcg total) by nebulization 2 (two) times daily. 120 mL 0 Past Month at Unknown time  . bromocriptine (PARLODEL) 2.5 MG tablet TAKE  (1)  TABLET TWICE A DAY. 60 tablet 0 11/03/2015 at Unknown time  . calcium carbonate (OS-CAL) 600 MG TABS tablet Take 600 mg by mouth daily with breakfast.    11/03/2015 at Unknown time  . cholecalciferol (VITAMIN D) 1000 UNITS tablet Take 1,000 Units by mouth daily.    11/03/2015 at Unknown time  . cyanocobalamin 100 MCG tablet Take 100 mcg by mouth daily.   11/03/2015 at Unknown time  . diltiazem (CARDIZEM CD) 120 MG 24 hr capsule TAKE (1) CAPSULE DAILY 30 capsule 1 11/04/2015 at 0730  . Docusate Calcium (STOOL SOFTENER PO) Take 1 tablet by mouth 2 (two) times daily as needed (constipation).   11/03/2015 at Unknown time  . ferrous sulfate 325 (65 FE) MG tablet Take 325 mg by mouth daily.    11/03/2015 at Unknown time  . guaiFENesin (MUCINEX) 600 MG 12 hr tablet Take 600 mg by mouth 2 (two) times daily.    11/03/2015 at Unknown time  . levothyroxine (SYNTHROID, LEVOTHROID) 50 MCG tablet TAKE 1 TABLET IN MORNING 30 tablet 2 11/03/2015 at Unknown time  . magnesium oxide (MAG-OX) 400 MG tablet Take 400 mg by mouth every other day.    11/03/2015 at Unknown time  . OXYGEN Inhale 2 L/min into the lungs as needed.   Past Week at Unknown time  . pantoprazole (PROTONIX) 40 MG tablet Take 1 tablet (40 mg total) by mouth 2 (two) times daily before a meal . 60 tablet 3 11/04/2015 at 0730  . polyethylene glycol (MIRALAX / GLYCOLAX) packet Take 17 g by mouth daily.    11/03/2015 at Unknown time  . sucralfate (CARAFATE) 1 g tablet Take 1 tablet (dissolve tablet to make slurry) 2-4 times daily 120 tablet 3 11/04/2015 at 0730  . SYMBICORT 160-4.5 MCG/ACT inhaler Inhale 2 puffs into the lungs 2 (two) times daily. 10.2 g 1 11/04/2015 at 0730  . tetrahydrozoline (VISINE) 0.05 % ophthalmic solution Place  1 drop into both eyes as needed (For dry eyes).   Past Month at Unknown time  . vitamin E (VITAMIN E) 400 UNIT capsule Take 400 Units by mouth daily.   11/03/2015 at Unknown time  . bisacodyl (DULCOLAX) 10 MG suppository Place 10 mg rectally as needed for moderate constipation.   More than a month at Unknown time  . montelukast (SINGULAIR) 10 MG tablet Take 10 mg by mouth daily as needed (Allergies).  More than a month at Unknown time  . nitroGLYCERIN (NITROSTAT) 0.4 MG SL tablet Place 0.4 mg under the tongue every 5 (five) minutes as needed for chest pain. Reported on 06/24/2015   More than a month at Unknown time  . traMADol-acetaminophen (ULTRACET) 37.5-325 MG tablet Take 1 tablet by mouth every 6 (six) hours as needed. (Patient taking differently: Take 1 tablet by mouth every 6 (six) hours as needed for moderate pain or severe pain. ) 12 tablet 0 More than a month at Unknown time   Allergies:  Allergies  Allergen Reactions  . Betadine [Povidone Iodine]     "Skin blisters"  . Lortab [Hydrocodone-Acetaminophen] Nausea And Vomiting  . Penicillins Other (See Comments)    Light headed Has patient had a PCN reaction causing immediate rash, facial/tongue/throat swelling, SOB or lightheadedness with hypotension: Yes Has patient had a PCN reaction causing severe rash involving mucus membranes or skin necrosis: No Has patient had a PCN reaction that required hospitalization No Has patient had a PCN reaction occurring within the last 10 years: No If all of the above answers are "NO", then may proceed with Cephalosporin use.  Marland Kitchen Zetia [Ezetimibe] Other (See Comments)    Muscle weakness  . Zocor [Simvastatin] Nausea Only and Other (See Comments)    muscle weakness    Family History  Problem Relation Age of Onset  . Heart disease Sister   . Heart failure Father   . Colon cancer Neg Hx   . Esophageal cancer Neg Hx   . Rectal cancer Neg Hx   . Stomach cancer Neg Hx    Social History:  reports  that he has never smoked. He has never used smokeless tobacco. He reports that he does not drink alcohol or use drugs.  Review of Systems  Genitourinary: Positive for frequency and urgency.  All other systems reviewed and are negative.   Physical Exam:  Vital signs in last 24 hours: Temp:  [97.5 F (36.4 C)] 97.5 F (36.4 C) (09/06 1100) Pulse Rate:  [57] 57 (09/06 1100) Resp:  [14-22] 14 (09/06 1145) BP: (109-137)/(61-77) 109/61 (09/06 1145) SpO2:  [95 %-98 %] 98 % (09/06 1145) Weight:  [73.9 kg (163 lb)] 73.9 kg (163 lb) (09/06 1100) Physical Exam  Constitutional: He is oriented to person, place, and time. He appears well-developed and well-nourished.  HENT:  Head: Normocephalic and atraumatic.  Eyes: EOM are normal. Pupils are equal, round, and reactive to light.  Neck: Normal range of motion. No thyromegaly present.  Cardiovascular: Normal rate and regular rhythm.   Respiratory: Effort normal. No respiratory distress.  GI: Soft. He exhibits no distension.  Musculoskeletal: Normal range of motion. He exhibits no edema.  Neurological: He is oriented to person, place, and time.  Skin: Skin is warm and dry.  Psychiatric: He has a normal mood and affect. His behavior is normal. Judgment and thought content normal.    Laboratory Data:  No results found for this or any previous visit (from the past 24 hour(s)). No results found for this or any previous visit (from the past 240 hour(s)). Creatinine: No results for input(s): CREATININE in the last 168 hours. Baseline Creatinine: na  Impression/Assessment:  83yo with bladder neck contracture  Plan:  The risks/benefits/alternatives to bladder neck resection was explained to the patient and he understands and wishes to proceed with surgery  Nicolette Bang 11/04/2015, 11:52 AM

## 2015-11-09 ENCOUNTER — Other Ambulatory Visit: Payer: Self-pay | Admitting: Family Medicine

## 2015-11-10 NOTE — Progress Notes (Signed)
HPI The patient presents for followup of his known coronary disease. He had a negative stress perfusion study prior to hip surgery in 2013. Since then he's had a bladder surgery.  He was in the hospital in April and I reviewed these records.  He had PAF with RVR.  He converted to NSR.  He was not started on anticoagulation because of UGIB in the past and recent hematuria.  He did have an elevated troponin.  However, he had no evidence of ischemia on Lexiscan Myoview.  He returns for follow up.  Since I last saw him he's had no new cardiovascular complaints. He might feel occasional palpitations but really does relatively well with this. Unfortunately his wife's hospice. He denies any chest pressure, neck or arm discomfort. He's not shortness of breath, PND or orthopnea.  He is finishing up with some prostate therapy and actually had catheter removed today.  Allergies  Allergen Reactions  . Betadine [Povidone Iodine]     "Skin blisters"  . Lortab [Hydrocodone-Acetaminophen] Nausea And Vomiting  . Penicillins Other (See Comments)    Light headed Has patient had a PCN reaction causing immediate rash, facial/tongue/throat swelling, SOB or lightheadedness with hypotension: Yes Has patient had a PCN reaction causing severe rash involving mucus membranes or skin necrosis: No Has patient had a PCN reaction that required hospitalization No Has patient had a PCN reaction occurring within the last 10 years: No If all of the above answers are "NO", then may proceed with Cephalosporin use.  Marland Kitchen Zetia [Ezetimibe] Other (See Comments)    Muscle weakness  . Zocor [Simvastatin] Nausea Only and Other (See Comments)    muscle weakness    Current Outpatient Prescriptions  Medication Sig Dispense Refill  . albuterol (PROVENTIL HFA;VENTOLIN HFA) 108 (90 BASE) MCG/ACT inhaler Inhale 2 puffs into the lungs every 6 (six) hours as needed for wheezing or shortness of breath.    Marland Kitchen arformoterol (BROVANA) 15 MCG/2ML  NEBU Take 2 mLs (15 mcg total) by nebulization 2 (two) times daily. 120 mL 0  . bisacodyl (DULCOLAX) 10 MG suppository Place 10 mg rectally as needed for moderate constipation.    . bromocriptine (PARLODEL) 2.5 MG tablet TAKE  (1)  TABLET TWICE A DAY. 60 tablet 2  . calcium carbonate (OS-CAL) 600 MG TABS tablet Take 600 mg by mouth daily with breakfast.     . cholecalciferol (VITAMIN D) 1000 UNITS tablet Take 1,000 Units by mouth daily.     . cyanocobalamin 100 MCG tablet Take 100 mcg by mouth daily.    Marland Kitchen diltiazem (DILACOR XR) 120 MG 24 hr capsule Take 120 mg by mouth daily.    Mariane Baumgarten Calcium (STOOL SOFTENER PO) Take 1 tablet by mouth 2 (two) times daily as needed (constipation).    . ferrous sulfate 325 (65 FE) MG tablet Take 325 mg by mouth daily.     Marland Kitchen guaiFENesin (MUCINEX) 600 MG 12 hr tablet Take 600 mg by mouth 2 (two) times daily.     Marland Kitchen levothyroxine (SYNTHROID, LEVOTHROID) 50 MCG tablet TAKE 1 TABLET IN MORNING 30 tablet 2  . magnesium oxide (MAG-OX) 400 MG tablet Take 400 mg by mouth every other day.     . montelukast (SINGULAIR) 10 MG tablet Take 10 mg by mouth daily as needed (Allergies).    . nitroGLYCERIN (NITROSTAT) 0.4 MG SL tablet Place 0.4 mg under the tongue every 5 (five) minutes as needed for chest pain. Reported on 06/24/2015    .  pantoprazole (PROTONIX) 40 MG tablet Take 1 tablet (40 mg total) by mouth 2 (two) times daily before a meal . 60 tablet 3  . polyethylene glycol (MIRALAX / GLYCOLAX) packet Take 17 g by mouth daily.     . sucralfate (CARAFATE) 1 g tablet Take 1 tablet (dissolve tablet to make slurry) 2-4 times daily 120 tablet 3  . SYMBICORT 160-4.5 MCG/ACT inhaler Inhale 2 puffs into the lungs 2 (two) times daily. 10.2 g 1  . tetrahydrozoline (VISINE) 0.05 % ophthalmic solution Place 1 drop into both eyes as needed (For dry eyes).    . traMADol-acetaminophen (ULTRACET) 37.5-325 MG tablet Take 1 tablet by mouth every 6 (six) hours as needed. 30 tablet 0  .  OXYGEN Inhale 2 L/min into the lungs as needed.    . vitamin E (VITAMIN E) 400 UNIT capsule Take 400 Units by mouth daily.     No current facility-administered medications for this visit.     Past Medical History:  Diagnosis Date  . Abnormality of gait 06/07/2013  . Anemia   . Anginal pain (HCC)    hx of   . Arthritis   . Asthma   . Atrial fibrillation ([place])   . Blood transfusion june 2011, 5 units given   july 2011 some units given  . CAD (coronary artery disease) 03/2006   3.0 x 20 mm TAXUS Perseus DES to the LAD; 01/2007  L main 30%, oLAD 50%, pLAD stent ok, CFX 80%, OM 60%, pRCA 60%, mRCA 70%, oPDA 90%; med rx   . Cancer ([place])    skin cancer   . Cataract    bilateral removal of cateracts  . Complication of anesthesia     no issues,but pt prefers spinal due to Pulmonary problems  . COPD (chronic obstructive pulmonary disease) ([employer])    oxygen  on standby in home.  . Depression   . Dizziness    occasional  . Emphysema   . Enlarged prostate    elevated psa recently  . Enlarged prostate with urinary retention   . GERD (gastroesophageal reflux disease)   . Hypothyroidism   . Mass, scrotum    L scrotum - Sees Dr. Jeffie Pollock  . Neuropathy ([place])   . Pituitary macroadenoma ([place])   . Pneumonia    No recent.  Marland Kitchen Restless legs syndrome (RLS)   . Transfusion history    history 4 yrs ago.   Marland Kitchen UGIB (upper gastrointestinal bleed) 03/2013   EGD w/ large ulcer at GE junction  . UGIB (upper gastrointestinal bleed) 08/09/2009   At GE junction, rx w/ endoclips  . UGIB (upper gastrointestinal bleed) 09/2009   just above GE junction, rx  w/ 2 endoclips    Past Surgical History:  Procedure Laterality Date  . ABDOMINAL HERNIA REPAIR   2008  . CARDIAC CATHETERIZATION  01/2007   L main 30%, oLAD 50%,  pLAD stent ok, CFX 80%, OM 60%, pRCA 60%, mRCA 70%, oPDA 90%; med rx  . cataract extraction both eyes    . COLONOSCOPY    . CORONARY STENT PLACEMENT  03/2006   3.0 x 20 mm TAXUS Perseus  DES to the LAD  . CYSTOSCOPY N/A 06/10/2015   Procedure: CYSTOSCOPY FULGRATION OF BLEEDING,  electovapor resection;  Surgeon: Irine Seal, MD;  Location: [facility];  Service: Urology;  Laterality: N/A;  . CYSTOSCOPY WITH INSERTION OF UROLIFT N/A 06/01/2015   Procedure: CYSTOSCOPY WITH INSERTION OF UROLIFT x4;  Surgeon: Irine Seal, MD;  Location:  [facility];  Service: Urology;  Laterality: N/A;  . ESOPHAGOGASTRODUODENOSCOPY N/A 04/20/2013   Procedure: ESOPHAGOGASTRODUODENOSCOPY (EGD);  Surgeon: Inda Castle, MD;  Location: [facility];  Service: Endoscopy;  Laterality: N/A;  . ESOPHAGOGASTRODUODENOSCOPY (EGD) WITH PROPOFOL N/A 10/13/2015   Procedure: ESOPHAGOGASTRODUODENOSCOPY (EGD) WITH PROPOFOL;  Surgeon: Jerene Bears, MD;  Location: [facility];  Service: Gastroenterology;  Laterality: N/A;  . FOOT SURGERY  1994 left, 2002 right foot   bilateral foot reconstruciton  . FOOT SURGERY     reconstruction of both feet- no retained hardware.  Marland Kitchen HIATAL HERNIA REPAIR  01-04-2008  . PROSTATE SURGERY     x 2  . TOTAL HIP ARTHROPLASTY  07/21/2011   Procedure: TOTAL HIP ARTHROPLASTY ANTERIOR APPROACH;  Surgeon: Mauri Pole, MD;  Location: [facility];  Service: Orthopedics;  Laterality: Left;  . TOTAL HIP ARTHROPLASTY Right 09/07/2012   Procedure: RIGHT TOTAL HIP ARTHROPLASTY ANTERIOR APPROACH;  Surgeon: Mcarthur Rossetti, MD;  Location: [facility];  Service: Orthopedics;  Laterality: Right;  . UPPER GASTROINTESTINAL ENDOSCOPY  12/2013   Dr Hilarie Fredrickson, gastritis    ROS: Positive for dizziness and balance problems,neuropathy.  Otherwise as stated in the HPI and negative for all other systems.  PHYSICAL EXAM BP 100/60   Pulse 76   Ht 5\' 7"  (1.702 m)   Wt 161 lb (73 kg)   BMI 25.22 kg/m  GENERAL:  Well appearing, but frail HEENT:  Pupils equal round and reactive, fundi not visualized, oral mucosa unremarkable NECK:  No jugular venous distention, waveform within normal limits, carotid upstroke brisk and  symmetric, no bruits, no thyromegaly LUNGS:  Clear to auscultation bilaterally BACK:  No CVA tenderness CHEST:  Unremarkable HEART:  PMI not displaced or sustained,S1 and S2 within normal limits, no S3, no S4, no clicks, no rubs, no murmurs ABD:  Flat, positive bowel sounds normal in frequency in pitch, no bruits, no rebound, no guarding, no midline pulsatile mass, no hepatomegaly, no splenomegaly EXT:  2 plus pulses upper, decreased DP/PT bilateral, mild edema, no cyanosis no clubbing   ASSESSMENT AND PLAN  CORONARY ARTERY DISEASE -  The patient has no new sypmtoms.  No further cardiovascular testing is indicated.  We will continue with aggressive risk reduction and meds as listed.  ATRIAL FIB - He has not had any symptomatic recurrence of this.  He would not be an anticoagulation candidate.  No change in therapy is needed.     HYPERLIPIDEMIA -  This is followed by Clois Dupes, MD who watches this closely.

## 2015-11-11 ENCOUNTER — Ambulatory Visit (INDEPENDENT_AMBULATORY_CARE_PROVIDER_SITE_OTHER): Payer: Self-pay | Admitting: Urology

## 2015-11-11 ENCOUNTER — Encounter: Payer: Self-pay | Admitting: Cardiology

## 2015-11-11 ENCOUNTER — Ambulatory Visit (INDEPENDENT_AMBULATORY_CARE_PROVIDER_SITE_OTHER): Payer: Medicare Other | Admitting: Cardiology

## 2015-11-11 VITALS — BP 100/60 | HR 76 | Ht 67.0 in | Wt 161.0 lb

## 2015-11-11 DIAGNOSIS — I480 Paroxysmal atrial fibrillation: Secondary | ICD-10-CM | POA: Diagnosis not present

## 2015-11-11 DIAGNOSIS — Z9889 Other specified postprocedural states: Secondary | ICD-10-CM

## 2015-11-11 NOTE — Patient Instructions (Signed)
Medication Instructions:  The current medical regimen is effective;  continue present plan and medications.  Follow-Up: Follow up in 6 months with Dr. Hochrein.  You will receive a letter in the mail 2 months before you are due.  Please call us when you receive this letter to schedule your follow up appointment.  If you need a refill on your cardiac medications before your next appointment, please call your pharmacy.  Thank you for choosing [facility]!!       

## 2015-11-11 NOTE — Op Note (Signed)
Preoperative diagnosis: bladder neck contracture  Postoperative diagnosis: same  Procedure: 1. Resection of bladder neck 2. Transurethral resection of the prostate  Attending: Nicolette Bang  Anesthesia: General  Estimated blood loss: Minimal  Drains: 22 French foley  Specimens: 1. Prostate Chips  Antibiotics: Rocephin  Findings: Trilobar prostate enlargement. Ureteral orifices in normal anatomic location.   Indications: Patient is a 82 year old male with a history of BPH who developed a bladder neck contracture after Urolift.  After discussing treatment options, they decided proceed with transurethral resection of the bladder neck and prostate.  Procedure her in detail: The patient was brought to the operating room and a brief timeout was done to ensure correct patient, correct procedure, correct site.  General anesthesia was administered patient was placed in dorsal lithotomy position.  Their genitalia was then prepped and draped in usual sterile fashion.  A rigid 17 French cystoscope was passed in the urethra and we encountered a dense bladder neck contracture. Using the resectoscope we resected the bladder neck to gain access to the bladder.  Bladder was inspected and we noted no masses or lesions.  the ureteral orifices were in the normal orthotopic locations. We then turned our attention to the prostate resection. Using the bipolar resectoscope we resected the median lobe first from the bladder neck to the verumontanum. We then started at the 12 oclock position on the left lobe and resection to the 6 o'clock position from the bladder neck to the verumontanum. We then did the same resection of the right lobe. Once the resection was complete we then cauterized individual bleeders. We then removed the prostate chips and sent them for pathology.  We then re-inspected the prostatic fossa and found no residual bleeding.  the bladder was then drained, a 22 French foley was placed and this  concluded the procedure which was well tolerated by patient.  Complications: None  Condition: Stable, extubated, transferred to PACU  Plan: Patient  will be discharged home and followup in 5 days for foley catheter removal and pathology discussion.

## 2015-11-12 ENCOUNTER — Emergency Department (HOSPITAL_COMMUNITY)
Admission: EM | Admit: 2015-11-12 | Discharge: 2015-11-12 | Disposition: A | Discharge status: Home / Self Care | Payer: Medicare Other | Attending: Emergency Medicine | Admitting: Emergency Medicine

## 2015-11-12 ENCOUNTER — Encounter (HOSPITAL_COMMUNITY): Payer: Self-pay

## 2015-11-12 DIAGNOSIS — J45909 Unspecified asthma, uncomplicated: Secondary | ICD-10-CM | POA: Diagnosis not present

## 2015-11-12 DIAGNOSIS — E039 Hypothyroidism, unspecified: Secondary | ICD-10-CM | POA: Diagnosis not present

## 2015-11-12 DIAGNOSIS — Z79899 Other long term (current) drug therapy: Secondary | ICD-10-CM | POA: Insufficient documentation

## 2015-11-12 DIAGNOSIS — I2510 Atherosclerotic heart disease of native coronary artery without angina pectoris: Secondary | ICD-10-CM | POA: Insufficient documentation

## 2015-11-12 DIAGNOSIS — Z85828 Personal history of other malignant neoplasm of skin: Secondary | ICD-10-CM | POA: Diagnosis not present

## 2015-11-12 DIAGNOSIS — N390 Urinary tract infection, site not specified: Secondary | ICD-10-CM | POA: Diagnosis not present

## 2015-11-12 DIAGNOSIS — J449 Chronic obstructive pulmonary disease, unspecified: Secondary | ICD-10-CM | POA: Diagnosis not present

## 2015-11-12 DIAGNOSIS — R350 Frequency of micturition: Secondary | ICD-10-CM | POA: Diagnosis present

## 2015-11-12 LAB — URINALYSIS, ROUTINE W REFLEX MICROSCOPIC
Bilirubin Urine: NEGATIVE
Glucose, UA: NEGATIVE mg/dL
Ketones, ur: NEGATIVE mg/dL
Nitrite: POSITIVE — AB
Protein, ur: >300 mg/dL — AB
Specific Gravity, Urine: 1.02 (ref 1.005–1.030)
pH: 6.5 (ref 5.0–8.0)

## 2015-11-12 LAB — URINE MICROSCOPIC-ADD ON

## 2015-11-12 MED ORDER — CIPROFLOXACIN HCL 250 MG PO TABS
500.0000 mg | ORAL_TABLET | Freq: Once | ORAL | Status: AC
  Administered 2015-11-12: 500 mg via ORAL
  Filled 2015-11-12: qty 2

## 2015-11-12 MED ORDER — CIPROFLOXACIN HCL 500 MG PO TABS: 500.0000 mg | ORAL_TABLET | Freq: Two times a day (BID) | ORAL | 0 refills | Status: DC

## 2015-11-12 NOTE — ED Triage Notes (Signed)
I had prostate surgery 1 week ago, and my cath was removed yesterday after one week.  Started running a low grade fever today.

## 2015-11-12 NOTE — ED Notes (Signed)
Pt ambulated to restroom & returned to room w/ no complications. 

## 2015-11-12 NOTE — ED Notes (Signed)
Pt alert & oriented x4, stable gait. Patient given discharge instructions, paperwork & prescription(s). Patient  instructed to stop at the registration desk to finish any additional paperwork. Patient verbalized understanding. Pt left department w/ no further questions. 

## 2015-11-12 NOTE — ED Notes (Signed)
Pt had prostate surgery a week ago. Catheter was removed yesterday. Pt been having pain, increased frequency in urination. Pt says running a low grade fever at home 101.3. Tylenol was taking about 3 hours ago.

## 2015-11-12 NOTE — ED Triage Notes (Signed)
Increase frequency of urination, burning per family.

## 2015-11-12 NOTE — ED Provider Notes (Addendum)
[facility] DEPT Provider Note   CSN: [id number] Arrival date & time: 11/12/15  1953  By signing my name below, I, Dora Sims, attest that this documentation has been prepared under the direction and in the presence of physician practitioner, Virgel Manifold, MD. Electronically Signed: Dora Sims, Scribe. 11/12/2015. 10:02 PM.  History   Chief Complaint Chief Complaint  Patient presents with  . Post-op Problem    The history is provided by the patient. No language interpreter was used.     HPI Comments: [name] is a 82 y.o. male who presents to the Emergency Department complaining of increased urinary frequency and urgency s/p prostate surgery 1 week ago. Pt notes this is his third prostate surgery and he had his catheter removed yesterday after wearing it for one week. Pt states he started feeling nauseous this afternoon and measured a temperature of 101.4 this evening. He also notes some dysuria since the surgery. Pt denies vomiting, difficulty urinating, or any other associated symptoms.  Past Medical History:  Diagnosis Date  . Abnormality of gait 06/07/2013  . Anemia   . Anginal pain (HCC)    hx of   . Arthritis   . Asthma   . Atrial fibrillation ([place])   . Blood transfusion june 2011, 5 units given   july 2011 some units given  . CAD (coronary artery disease) 03/2006   3.0 x 20 mm TAXUS Perseus DES to the LAD; 01/2007  L main 30%, oLAD 50%, pLAD stent ok, CFX 80%, OM 60%, pRCA 60%, mRCA 70%, oPDA 90%; med rx   . Cancer ([place])    skin cancer   . Cataract    bilateral removal of cateracts  . Complication of anesthesia     no issues,but pt prefers spinal due to Pulmonary problems  . COPD (chronic obstructive pulmonary disease) ([place])    oxygen  on standby in home.  . Depression   . Dizziness    occasional  . Emphysema   . Enlarged prostate    elevated psa recently  . Enlarged prostate with urinary retention   . GERD (gastroesophageal reflux disease)     . Hypothyroidism   . Mass, scrotum    L scrotum - Sees Dr. Jeffie Pollock  . Neuropathy ([place])   . Pituitary macroadenoma ([place])   . Pneumonia    No recent.  Marland Kitchen Restless legs syndrome (RLS)   . Transfusion history    history 4 yrs ago.   Marland Kitchen UGIB (upper gastrointestinal bleed) 03/2013   EGD w/ large ulcer at GE junction  . UGIB (upper gastrointestinal bleed) 08/09/2009   At GE junction, rx w/ endoclips  . UGIB (upper gastrointestinal bleed) 09/2009   just above GE junction, rx  w/ 2 endoclips    Patient Active Problem List   Diagnosis Date Noted  . Abdominal pain, epigastric   . Dysphagia   . Atrial fibrillation with rapid ventricular response ([place])   . Elevated troponin   . Atrial fibrillation with RVR ([place]) 06/12/2015  . Pneumonia 06/12/2015  . Leukocytosis 06/12/2015  . Clot retention of urine 06/11/2015  . BPH (benign prostatic hypertrophy) with urinary obstruction 06/11/2015  . Postoperative anemia due to acute blood loss 06/11/2015  . Hematuria 06/10/2015  . Neck pain, bilateral 12/31/2013  . Neuropathy ([place]) 12/31/2013  . Vitamin B 12 deficiency 12/31/2013  . Abnormality of gait 06/07/2013  . Esophageal ulcer with bleeding 04/20/2013  . S/P left and ritght THA, AA 07/21/2011  . Special screening  for malignant neoplasms, colon 01/03/2011  . Personal history of colonic polyps 01/03/2011  . COPD GOLD I 08/24/2010  . GI BLEED 11/17/2009  . ESOPHAGEAL REFLUX 08/25/2009  . BLOOD IN STOOL-MELENA 08/25/2009  . COLONIC POLYPS 07/17/2008  . ANEMIA 07/17/2008  . HYPOTENSION 07/17/2008  . ESOPHAGITIS 07/17/2008  . DIVERTICULOSIS, MILD 07/17/2008  . DEGENERATIVE JOINT DISEASE 07/17/2008  . Osteoporosis 07/17/2008  . BPH (benign prostatic hyperplasia) 07/17/2008  . Hyperprolactinemia ([place]) 10/24/2007  . PITUITARY ADENOMA 06/25/2007  . Hypothyroidism 06/25/2007  . Hyperlipidemia LDL goal <70 06/22/2007  . Coronary atherosclerosis 06/22/2007    Past Surgical History:  Procedure  Laterality Date  . ABDOMINAL HERNIA REPAIR   2008  . CARDIAC CATHETERIZATION  01/2007   L main 30%, oLAD 50%,  pLAD stent ok, CFX 80%, OM 60%, pRCA 60%, mRCA 70%, oPDA 90%; med rx  . cataract extraction both eyes    . COLONOSCOPY    . CORONARY STENT PLACEMENT  03/2006   3.0 x 20 mm TAXUS Perseus DES to the LAD  . CYSTOSCOPY N/A 06/10/2015   Procedure: CYSTOSCOPY FULGRATION OF BLEEDING,  electovapor resection;  Surgeon: Irine Seal, MD;  Location: [facility];  Service: Urology;  Laterality: N/A;  . CYSTOSCOPY WITH INSERTION OF UROLIFT N/A 06/01/2015   Procedure: CYSTOSCOPY WITH INSERTION OF UROLIFT x4;  Surgeon: Irine Seal, MD;  Location: [facility];  Service: Urology;  Laterality: N/A;  . ESOPHAGOGASTRODUODENOSCOPY N/A 04/20/2013   Procedure: ESOPHAGOGASTRODUODENOSCOPY (EGD);  Surgeon: Inda Castle, MD;  Location: [facility];  Service: Endoscopy;  Laterality: N/A;  . ESOPHAGOGASTRODUODENOSCOPY (EGD) WITH PROPOFOL N/A 10/13/2015   Procedure: ESOPHAGOGASTRODUODENOSCOPY (EGD) WITH PROPOFOL;  Surgeon: Jerene Bears, MD;  Location: [facility];  Service: Gastroenterology;  Laterality: N/A;  . FOOT SURGERY  1994 left, 2002 right foot   bilateral foot reconstruciton  . FOOT SURGERY     reconstruction of both feet- no retained hardware.  Marland Kitchen HIATAL HERNIA REPAIR  01-04-2008  . PROSTATE SURGERY     x 2  . TOTAL HIP ARTHROPLASTY  07/21/2011   Procedure: TOTAL HIP ARTHROPLASTY ANTERIOR APPROACH;  Surgeon: Mauri Pole, MD;  Location: [facility];  Service: Orthopedics;  Laterality: Left;  . TOTAL HIP ARTHROPLASTY Right 09/07/2012   Procedure: RIGHT TOTAL HIP ARTHROPLASTY ANTERIOR APPROACH;  Surgeon: Mcarthur Rossetti, MD;  Location: [facility];  Service: Orthopedics;  Laterality: Right;  . UPPER GASTROINTESTINAL ENDOSCOPY  12/2013   Dr Hilarie Fredrickson, gastritis       Home Medications    Prior to Admission medications   Medication Sig Start Date End Date Taking? Authorizing Provider  albuterol (PROVENTIL  HFA;VENTOLIN HFA) 108 (90 BASE) MCG/ACT inhaler Inhale 2 puffs into the lungs every 6 (six) hours as needed for wheezing or shortness of breath.   Yes Historical Provider, MD  arformoterol (BROVANA) 15 MCG/2ML NEBU Take 15 mcg by nebulization 2 (two) times daily as needed (for shortness of breath).   Yes Historical Provider, MD  bisacodyl (DULCOLAX) 10 MG suppository Place 10 mg rectally as needed for moderate constipation.   Yes Historical Provider, MD  bromocriptine (PARLODEL) 2.5 MG tablet Take 2.5 mg by mouth 2 (two) times daily.   Yes Historical Provider, MD  budesonide-formoterol (SYMBICORT) 160-4.5 MCG/ACT inhaler Inhale 2 puffs into the lungs 2 (two) times daily.   Yes Historical Provider, MD  calcium carbonate (OSCAL) 1500 (600 Ca) MG TABS tablet Take 600 mg of elemental calcium by mouth daily with breakfast.   Yes Historical Provider,  MD  cholecalciferol (VITAMIN D) 1000 UNITS tablet Take 1,000 Units by mouth daily.    Yes Historical Provider, MD  diltiazem (DILACOR XR) 120 MG 24 hr capsule Take 120 mg by mouth daily.   Yes Historical Provider, MD  docusate sodium (COLACE) 100 MG capsule Take 100 mg by mouth daily.   Yes Historical Provider, MD  ferrous sulfate 325 (65 FE) MG tablet Take 325 mg by mouth daily with breakfast.    Yes Historical Provider, MD  guaiFENesin (MUCINEX) 600 MG 12 hr tablet Take 600 mg by mouth daily.    Yes Historical Provider, MD  levothyroxine (SYNTHROID, LEVOTHROID) 50 MCG tablet Take 50 mcg by mouth daily before breakfast.   Yes Historical Provider, MD  magnesium oxide (MAG-OX) 400 (241.3 Mg) MG tablet Take 400 mg by mouth every other day.   Yes Historical Provider, MD  montelukast (SINGULAIR) 10 MG tablet Take 10 mg by mouth at bedtime as needed (for allergies).    Yes Historical Provider, MD  naphazoline-pheniramine (NAPHCON-A) 0.025-0.3 % ophthalmic solution Place 1 drop into both eyes as needed for irritation.   Yes Historical Provider, MD  nitroGLYCERIN  (NITROSTAT) 0.4 MG SL tablet Place 0.4 mg under the tongue every 5 (five) minutes as needed for chest pain.    Yes Historical Provider, MD  pantoprazole (PROTONIX) 40 MG tablet Take 40 mg by mouth 2 (two) times daily before a meal.   Yes Historical Provider, MD  polyethylene glycol (MIRALAX / GLYCOLAX) packet Take 17 g by mouth daily.    Yes Historical Provider, MD  sucralfate (CARAFATE) 1 g tablet Take 1 g by mouth 4 (four) times daily as needed (for GI upset).   Yes Historical Provider, MD  traMADol-acetaminophen (ULTRACET) 37.5-325 MG tablet Take 1 tablet by mouth every 6 (six) hours as needed for moderate pain.   Yes Historical Provider, MD  vitamin B-12 (CYANOCOBALAMIN) 1000 MCG tablet Take 1,000 mcg by mouth daily.   Yes Historical Provider, MD  vitamin E (VITAMIN E) 400 UNIT capsule Take 400 Units by mouth daily.   Yes Historical Provider, MD    Family History Family History  Problem Relation Age of Onset  . Heart disease Sister   . Heart failure Father   . Colon cancer Neg Hx   . Esophageal cancer Neg Hx   . Rectal cancer Neg Hx   . Stomach cancer Neg Hx     Social History Social History  Substance Use Topics  . Smoking status: Never Smoker  . Smokeless tobacco: Never Used  . Alcohol use No     Allergies   Betadine [povidone iodine]; Lortab [hydrocodone-acetaminophen]; Penicillins; Zetia [ezetimibe]; and Zocor [simvastatin]   Review of Systems Review of Systems  Constitutional: Positive for fever.  Gastrointestinal: Positive for nausea. Negative for vomiting.  Genitourinary: Positive for dysuria, frequency and urgency. Negative for difficulty urinating.  All other systems reviewed and are negative.   Physical Exam Updated Vital Signs BP 121/64 (BP Location: Left Arm)   Pulse 96   Temp 98.5 F (36.9 C) (Oral)   Resp 18   Ht 5\' 7"  (1.702 m)   Wt 156 lb (70.8 kg)   SpO2 96%   BMI 24.43 kg/m   Physical Exam  Constitutional: He is oriented to person, place,  and time. He appears well-developed and well-nourished.  HENT:  Head: Normocephalic and atraumatic.  Eyes: EOM are normal.  Neck: Normal range of motion.  Cardiovascular: Normal rate, regular rhythm, normal heart sounds  and intact distal pulses.   Pulmonary/Chest: Effort normal and breath sounds normal. No respiratory distress.  Abdominal: Soft. He exhibits no distension. There is no tenderness.  Musculoskeletal: Normal range of motion.  Neurological: He is alert and oriented to person, place, and time.  Skin: Skin is warm and dry.  Psychiatric: He has a normal mood and affect. Judgment normal.  Nursing note and vitals reviewed.   ED Treatments / Results  Labs (all labs ordered are listed, but only abnormal results are displayed) Labs Reviewed  URINALYSIS, ROUTINE W REFLEX MICROSCOPIC (NOT AT [facility]) - Abnormal; Notable for the following:       Result Value   APPearance HAZY (*)    Hgb urine dipstick LARGE (*)    Protein, ur >300 (*)    Nitrite POSITIVE (*)    Leukocytes, UA SMALL (*)    All other components within normal limits  URINE MICROSCOPIC-ADD ON - Abnormal; Notable for the following:    Squamous Epithelial / LPF 0-5 (*)    Bacteria, UA MANY (*)    All other components within normal limits    EKG  EKG Interpretation None       Radiology No results found.  Procedures Procedures (including critical care time)  DIAGNOSTIC STUDIES: Oxygen Saturation is 96% on RA, adequate by my interpretation.    COORDINATION OF CARE: 10:05 PM Discussed treatment plan with pt at bedside and pt agreed to plan.  Medications Ordered in ED Medications - No data to display   Initial Impression / Assessment and Plan / ED Course  I have reviewed the triage vital signs and the nursing notes.  Pertinent labs & imaging results that were available during my care of the patient were reviewed by me and considered in my medical decision making (see chart for details).  Clinical Course     82 year old male with UTI after Foley catheter placement. The catheter is now removed. He has minimal tenderness on exam. He is actually very well appearing despite his fever and his age. I feel is appropriate for outpatient treatment. Strict return precautions were discussed.  Final Clinical Impressions(s) / ED Diagnoses   Final diagnoses:  UTI (lower urinary tract infection)    New Prescriptions New Prescriptions   No medications on file    I personally preformed the services scribed in my presence. The recorded information has been reviewed is accurate. Virgel Manifold, MD.    Virgel Manifold, MD 11/15/15 [id number]    Virgel Manifold, MD 11/15/15 [phone]

## 2015-11-13 ENCOUNTER — Encounter (HOSPITAL_COMMUNITY): Payer: Self-pay | Admitting: Urology

## 2015-11-15 LAB — URINE CULTURE: Culture: >=100000 — AB

## 2015-11-16 ENCOUNTER — Telehealth (HOSPITAL_BASED_OUTPATIENT_CLINIC_OR_DEPARTMENT_OTHER): Payer: Self-pay | Admitting: Emergency Medicine

## 2015-11-16 ENCOUNTER — Encounter: Payer: Self-pay | Admitting: Family Medicine

## 2015-11-16 NOTE — Telephone Encounter (Signed)
Post ED Visit - Positive Culture Follow-up  Culture report reviewed by antimicrobial stewardship pharmacist:  []  Elenor Quinones, Pharm.D. []  Heide Guile, Pharm.D., BCPS []  Parks Neptune, Pharm.D. []  Alycia Rossetti, Pharm.D., BCPS []  [place], Pharm.D., BCPS, AAHIVP []  Legrand Como, Pharm.D., BCPS, AAHIVP []  Milus Glazier, Pharm.D. []  Stephens November, [place].D. Dimitri Ped PharmD  Positive urine culture Treated with ciprofloxacin, organism sensitive to the same and no further patient follow-up is required at this time, symptom check, finish Cipro, return to ED or see PCP if no better, patient states much improved  Hazle Nordmann 11/16/2015, 12:35 PM

## 2015-12-02 ENCOUNTER — Other Ambulatory Visit: Payer: Self-pay | Admitting: *Deleted

## 2015-12-02 ENCOUNTER — Telehealth: Payer: Self-pay | Admitting: Family Medicine

## 2015-12-02 ENCOUNTER — Encounter: Payer: Self-pay | Admitting: *Deleted

## 2015-12-02 DIAGNOSIS — R300 Dysuria: Secondary | ICD-10-CM

## 2015-12-02 NOTE — Telephone Encounter (Signed)
Pt called and orders placed and he is in the flu appt.

## 2015-12-03 ENCOUNTER — Ambulatory Visit (INDEPENDENT_AMBULATORY_CARE_PROVIDER_SITE_OTHER): Payer: Medicare Other

## 2015-12-03 DIAGNOSIS — M810 Age-related osteoporosis without current pathological fracture: Secondary | ICD-10-CM | POA: Diagnosis not present

## 2015-12-03 DIAGNOSIS — Z23 Encounter for immunization: Secondary | ICD-10-CM | POA: Diagnosis not present

## 2015-12-10 ENCOUNTER — Encounter: Payer: Self-pay | Admitting: Family Medicine

## 2015-12-17 ENCOUNTER — Other Ambulatory Visit: Payer: Self-pay | Admitting: Family Medicine

## 2015-12-23 ENCOUNTER — Ambulatory Visit (INDEPENDENT_AMBULATORY_CARE_PROVIDER_SITE_OTHER): Payer: Medicare Other | Admitting: Internal Medicine

## 2015-12-23 ENCOUNTER — Encounter: Payer: Self-pay | Admitting: Internal Medicine

## 2015-12-23 VITALS — BP 108/60 | HR 72 | Ht 67.0 in | Wt 154.1 lb

## 2015-12-23 DIAGNOSIS — R1013 Epigastric pain: Secondary | ICD-10-CM | POA: Diagnosis not present

## 2015-12-23 DIAGNOSIS — K449 Diaphragmatic hernia without obstruction or gangrene: Secondary | ICD-10-CM

## 2015-12-23 DIAGNOSIS — K2940 Chronic atrophic gastritis without bleeding: Secondary | ICD-10-CM

## 2015-12-23 DIAGNOSIS — K219 Gastro-esophageal reflux disease without esophagitis: Secondary | ICD-10-CM | POA: Diagnosis not present

## 2015-12-23 DIAGNOSIS — K5900 Constipation, unspecified: Secondary | ICD-10-CM

## 2015-12-23 MED ORDER — PANTOPRAZOLE SODIUM 40 MG PO TBEC: 40.0000 mg | DELAYED_RELEASE_TABLET | Freq: Two times a day (BID) | ORAL | 11 refills | Status: DC

## 2015-12-23 MED ORDER — POLYETHYLENE GLYCOL 3350 17 G PO PACK: 17.0000 g | PACK | Freq: Every day | ORAL | 3 refills | Status: DC

## 2015-12-23 MED ORDER — SUCRALFATE 1 G PO TABS: 1.0000 g | ORAL_TABLET | Freq: Four times a day (QID) | ORAL | 3 refills | Status: DC | PRN

## 2015-12-23 NOTE — Patient Instructions (Signed)
We have sent Pantoprazole,carafate and Miralax refills to your pharmacy today  Follow up in 4 months

## 2015-12-24 NOTE — Progress Notes (Signed)
Subjective:    Patient ID: [name], male    DOB: 06/26/1932, 82 y.o.   MRN: [id number]  HPI [name] is an 82 year old male with a history of GERD with esophageal ulceration, Barrett's esophagus, hiatal hernia status post repair with recurrence who is here for follow-up. I last saw him on 09/30/2015 in the office and then on 10/13/2015 for upper endoscopy to evaluate epigastric pain, dysphagia for pills GERD and history of his Barrett's. Upper endoscopy showed benign appearing stenosis 38 cm from the incisors which was traversed with the adult upper endoscope. This caused a superficial mucosal tear at and evident stricture. Z line was irregular but there were no masses or nodularity. Previously seen esophageal ulcer had healed. A 4 cm recurrent hiatal hernia. Diffuse atrophic appearing gastritis was found which was biopsied and the duodenum appeared normal. These gastric biopsies showed intestinal metaplasia in the setting of chronic gastritis. No H. Pylori.  Unfortunately [name] lost his wife Inez Catalina last month. We discussed this at length. Mrs. Chesebro was my patient as well. At the end of her life she had renal failure and shows to not have recurrent dialysis. She died in hospice on 12/22/15. They were married 37 years. He was tearful when discussing this but knows that she is with God. He states he told her that it was "okay to go" and that "in God's time I will follow along".  From a reflux perspective he states he has been feeling much better. He's had less episodes of epigastric pain. Occasionally when eating he will fill epigastric bloating which eases off usually with position changes. He's been using pantoprazole initially twice a day but most recently only for dinner. He is using sucralfate which she is making into a slurry at least twice a day sometimes 3 times a day. He's been using MiraLAX every other day with Colace which is working well for mild  constipation.   Review of Systems  as per history of present illness, otherwise negative   Current Medications, Allergies, Past Medical History, Past Surgical History, Family History and Social History were reviewed in [employer] record.     Objective:   Physical Exam BP 108/60   Pulse 72   Ht 5\' 7"  (1.702 m)   Wt 154 lb 2 oz (69.9 kg)   BMI 24.14 kg/m  Constitutional: Well-developed and well-nourished. No distress. HEENT: Normocephalic and atraumatic. Conjunctivae are normal.  No scleral icterus. Neck: Neck supple. Trachea midline. Cardiovascular: Normal rate, regular rhythm and intact distal pulses.  Pulmonary/chest: Effort normal and breath sounds normal. No wheezing, rales or rhonchi. Abdominal: Soft, nontender, nondistended. Bowel sounds active throughout.  Extremities: no clubbing, cyanosis, or edema. Neurological: Alert and oriented to person place and time. Skin: Skin is warm and dry.  Psychiatric: Normal mood and affect. Behavior is normal.     Assessment & Plan:   82 year old male with a history of GERD with esophageal ulceration, Barrett's esophagus, hiatal hernia status post repair with recurrence who is here for follow-up.  1. GERD/epigastric pain/gastritis/esophageal spasm -- all of these issues are causing intermittent symptoms as described in the history of present illness. He is doing better with PPI and is weaned down to once per day. He will continue pantoprazole 40 mg once per day. I encouraged to continue sucralfate at least before breakfast and at bedtime but can use additional doses prior to other meals if needed. He is aware he can use 1 g 3  times a day before meals and at bedtime. If symptoms recur or become more frequent have recommended he return to twice a day before meals pantoprazole dosing. Continue anti-reflux diet, small more frequent meals and remaining upright after eating.  2. Constipation -- mild doing well with MiraLAX  and Colace.  4-6 month follow-up, sooner if necessary  25 minutes spent with the patient today. Greater than 50% was spent in counseling and coordination of care with the patient

## 2015-12-25 ENCOUNTER — Ambulatory Visit: Payer: Medicare Other | Admitting: Internal Medicine

## 2016-01-06 ENCOUNTER — Other Ambulatory Visit: Payer: Self-pay | Admitting: Family Medicine

## 2016-01-18 ENCOUNTER — Other Ambulatory Visit: Payer: Self-pay | Admitting: Endocrinology

## 2016-02-01 ENCOUNTER — Encounter: Payer: Self-pay | Admitting: Family Medicine

## 2016-02-01 ENCOUNTER — Ambulatory Visit (INDEPENDENT_AMBULATORY_CARE_PROVIDER_SITE_OTHER): Payer: Medicare Other | Admitting: Family Medicine

## 2016-02-01 ENCOUNTER — Other Ambulatory Visit: Payer: Self-pay | Admitting: Family Medicine

## 2016-02-01 VITALS — BP 127/76 | HR 71 | Temp 96.8°F | Ht 67.0 in | Wt 155.0 lb

## 2016-02-01 DIAGNOSIS — E559 Vitamin D deficiency, unspecified: Secondary | ICD-10-CM | POA: Diagnosis not present

## 2016-02-01 DIAGNOSIS — J449 Chronic obstructive pulmonary disease, unspecified: Secondary | ICD-10-CM

## 2016-02-01 DIAGNOSIS — E538 Deficiency of other specified B group vitamins: Secondary | ICD-10-CM | POA: Diagnosis not present

## 2016-02-01 DIAGNOSIS — I4891 Unspecified atrial fibrillation: Secondary | ICD-10-CM | POA: Diagnosis not present

## 2016-02-01 DIAGNOSIS — E034 Atrophy of thyroid (acquired): Secondary | ICD-10-CM

## 2016-02-01 DIAGNOSIS — I2510 Atherosclerotic heart disease of native coronary artery without angina pectoris: Secondary | ICD-10-CM | POA: Diagnosis not present

## 2016-02-01 DIAGNOSIS — K219 Gastro-esophageal reflux disease without esophagitis: Secondary | ICD-10-CM | POA: Diagnosis not present

## 2016-02-01 DIAGNOSIS — E785 Hyperlipidemia, unspecified: Secondary | ICD-10-CM | POA: Diagnosis not present

## 2016-02-01 DIAGNOSIS — N400 Enlarged prostate without lower urinary tract symptoms: Secondary | ICD-10-CM

## 2016-02-01 MED ORDER — DOXYCYCLINE HYCLATE 100 MG PO TABS: 100.0000 mg | ORAL_TABLET | Freq: Two times a day (BID) | ORAL | 1 refills | Status: DC

## 2016-02-01 NOTE — Patient Instructions (Addendum)
Medicare Annual Wellness Visit  [facility] and the medical providers at [facility] strive to bring you the best medical care.  In doing so we not only want to address your current medical conditions and concerns but also to detect new conditions early and prevent illness, disease and health-related problems.    Medicare offers a yearly Wellness Visit which allows our clinical staff to assess your need for preventative services including immunizations, lifestyle education, counseling to decrease risk of preventable diseases and screening for fall risk and other medical concerns.    This visit is provided free of charge (no copay) for all Medicare recipients. The clinical pharmacists at [facility] have begun to conduct these Wellness Visits which will also include a thorough review of all your medications.    As you primary medical provider recommend that you make an appointment for your Annual Wellness Visit if you have not done so already this year.  You may set up this appointment before you leave today or you may call back [phone]) and schedule an appointment.  Please make sure when you call that you mention that you are scheduling your Annual Wellness Visit with the clinical pharmacist so that the appointment may be made for the proper length of time.     Continue current medications. Continue good therapeutic lifestyle changes which include good diet and exercise. Fall precautions discussed with patient. If an FOBT was given today- please return it to our front desk. If you are over 77 years old - you may need Prevnar 21 or the adult Pneumonia vaccine.  **Flu shots are available--- please call and schedule a FLU-CLINIC appointment**  After your visit with [place] today you will receive a survey in the mail or online from [employer] regarding your care with [place]. Please take a moment to fill this out. Your feedback is very  important to [place] as you can help [place] better understand your patient needs as well as improve your experience and satisfaction. WE CARE ABOUT YOU!!!  Continue with physical therapy Continue follow-up with urology Always be careful and did not put yourself at risk for falling This winter drink plenty of fluids and stay well hydrated and keep the house as cool as possible

## 2016-02-01 NOTE — Progress Notes (Signed)
Subjective:    Patient ID: [name], male    DOB: [birth year], 82 y.o.   MRN: [id number]  HPI Pt here for follow up and management of chronic medical problems which includes hypothyroid and hyperlipidemia. He is taking medications regularly.The patient recently lost his wife of cause of end-stage renal disease diabetes and hypertension. He himself has a history of multiple medical problems and issues. He has a history of atrial fibrillation GI bleed with anemia coronary atherosclerosis, B12 deficiency and pituitary adenoma. Adenoma. He has severe COPD BPH and reflux.Marland Kitchen He is currently doing physical therapy. He also has indigestion. He is taking pantoprazole for this. He will get a traditional lab panel today. He has his rectal exams and prostate done by his urologist. He is requesting a prescription for doxycycline to keep on hand because of his COPD so we can get on top of any kind of infection that he may have. We had a long discussion regarding the loss of his wife and the intravenous that he is felt. One of his children gave him a dog that is 70 years old and is now his companion at home. He denies any chest pain or palpitations or pressure. He shortness of breath and COPD these seem to be under good control presently with his inhalers. He is doing physical therapy next-door and see some improvement with doing this. He denies any trouble with blood in the stool or black tarry bowel movements. He does have the indigestion he is taking the proton X twice a day and the Carafate 3-4 times daily. He still has some indigestion with this. He does not have any nausea vomiting or diarrhea. He's passing his water better and has better control and is followed regularly by Dr. Aline August for his bladder and prostate issues.    Patient Active Problem List   Diagnosis Date Noted  . Abdominal pain, epigastric   . Dysphagia   . Atrial fibrillation with rapid ventricular response ([place])   . Elevated troponin     . Atrial fibrillation with RVR ([place]) 06/12/2015  . Pneumonia 06/12/2015  . Leukocytosis 06/12/2015  . Clot retention of urine 06/11/2015  . BPH (benign prostatic hypertrophy) with urinary obstruction 06/11/2015  . Postoperative anemia due to acute blood loss 06/11/2015  . Hematuria 06/10/2015  . Neck pain, bilateral 12/31/2013  . Neuropathy ([place]) 12/31/2013  . Vitamin B 12 deficiency 12/31/2013  . Abnormality of gait 06/07/2013  . Esophageal ulcer with bleeding 04/20/2013  . S/P left and ritght THA, AA 07/21/2011  . Special screening for malignant neoplasms, colon 01/03/2011  . Personal history of colonic polyps 01/03/2011  . COPD GOLD I 08/24/2010  . GI BLEED 11/17/2009  . ESOPHAGEAL REFLUX 08/25/2009  . BLOOD IN STOOL-MELENA 08/25/2009  . COLONIC POLYPS 07/17/2008  . ANEMIA 07/17/2008  . HYPOTENSION 07/17/2008  . ESOPHAGITIS 07/17/2008  . DIVERTICULOSIS, MILD 07/17/2008  . DEGENERATIVE JOINT DISEASE 07/17/2008  . Osteoporosis 07/17/2008  . BPH (benign prostatic hyperplasia) 07/17/2008  . Hyperprolactinemia ([place]) 10/24/2007  . PITUITARY ADENOMA 06/25/2007  . Hypothyroidism 06/25/2007  . Hyperlipidemia LDL goal <70 06/22/2007  . Coronary atherosclerosis 06/22/2007   Outpatient Encounter Prescriptions as of 02/01/2016  Medication Sig  . albuterol (PROVENTIL HFA;VENTOLIN HFA) 108 (90 BASE) MCG/ACT inhaler Inhale 2 puffs into the lungs every 6 (six) hours as needed for wheezing or shortness of breath.  . bisacodyl (DULCOLAX) 10 MG suppository Place 10 mg rectally as needed for moderate constipation.  . bromocriptine (PARLODEL)  2.5 MG tablet Take 2.5 mg by mouth 2 (two) times daily.  . budesonide-formoterol (SYMBICORT) 160-4.5 MCG/ACT inhaler Inhale 2 puffs into the lungs 2 (two) times daily.  . calcium carbonate (OSCAL) 1500 (600 Ca) MG TABS tablet Take 600 mg of elemental calcium by mouth 2 (two) times daily with a meal.   . cholecalciferol (VITAMIN D) 1000 UNITS tablet Take  1,000 Units by mouth daily.   Marland Kitchen diltiazem (DILACOR XR) 120 MG 24 hr capsule Take 120 mg by mouth daily.  . ferrous sulfate 325 (65 FE) MG tablet Take 325 mg by mouth daily with breakfast.   . guaiFENesin (MUCINEX) 600 MG 12 hr tablet Take 600 mg by mouth daily.   Marland Kitchen levothyroxine (SYNTHROID, LEVOTHROID) 50 MCG tablet TAKE 1 TABLET IN MORNING  . magnesium oxide (MAG-OX) 400 (241.3 Mg) MG tablet Take 400 mg by mouth every other day.  . montelukast (SINGULAIR) 10 MG tablet Take 10 mg by mouth at bedtime as needed (for allergies).   . naphazoline-pheniramine (NAPHCON-A) 0.025-0.3 % ophthalmic solution Place 1 drop into both eyes as needed for irritation.  . pantoprazole (PROTONIX) 40 MG tablet Take 1 tablet (40 mg total) by mouth 2 (two) times daily before a meal.  . polyethylene glycol (MIRALAX / GLYCOLAX) packet Take 17 g by mouth daily.  . sucralfate (CARAFATE) 1 g tablet Take 1 tablet (1 g total) by mouth 4 (four) times daily as needed (for GI upset).  . traMADol-acetaminophen (ULTRACET) 37.5-325 MG tablet Take 1 tablet by mouth every 6 (six) hours as needed for moderate pain.  . vitamin B-12 (CYANOCOBALAMIN) 1000 MCG tablet Take 1,000 mcg by mouth daily.  . vitamin E (VITAMIN E) 400 UNIT capsule Take 400 Units by mouth daily.  . [DISCONTINUED] ciprofloxacin (CIPRO) 500 MG tablet Take 1 tablet (500 mg total) by mouth every 12 (twelve) hours. (Patient taking differently: Take 500 mg by mouth as needed. )  . nitroGLYCERIN (NITROSTAT) 0.4 MG SL tablet Place 0.4 mg under the tongue every 5 (five) minutes as needed for chest pain.    No facility-administered encounter medications on file as of 02/01/2016.         Review of Systems  Constitutional: Negative.   HENT: Negative.   Eyes: Negative.   Respiratory: Negative.   Cardiovascular: Negative.   Gastrointestinal: Negative.        Indigestion  Endocrine: Negative.   Genitourinary: Negative.   Musculoskeletal: Negative.   Skin: Negative.     Allergic/Immunologic: Negative.   Neurological: Negative.   Hematological: Negative.   Psychiatric/Behavioral: Negative.        Objective:   Physical Exam  Constitutional: He is oriented to person, place, and time. He appears well-developed and well-nourished. No distress.  The patient is pleasant and alert and is at peace with the loss of his wife who suffered much and her final days. He is trying to take care of himself and become more active and is improving as far as his bladder symptoms have been under better control recently.  HENT:  Head: Normocephalic and atraumatic.  Right Ear: External ear normal.  Left Ear: External ear normal.  Nose: Nose normal.  Mouth/Throat: Oropharynx is clear and moist. No oropharyngeal exudate.  Eyes: Conjunctivae and EOM are normal. Pupils are equal, round, and reactive to light. Right eye exhibits no discharge. Left eye exhibits no discharge. No scleral icterus.  Neck: Normal range of motion. Neck supple. No thyromegaly present.  No bruits or thyromegaly  Cardiovascular:  Normal rate and regular rhythm.   No murmur heard. Diminished pulses in the right lower extremity The heart has a regular rate and rhythm at 72/m  Pulmonary/Chest: Effort normal and breath sounds normal. No respiratory distress. He has no wheezes. He has no rales. He exhibits no tenderness.  No axillary adenopathy. Good breath sounds anteriorly and posteriorly.  Abdominal: Soft. Bowel sounds are normal. He exhibits no mass. There is no tenderness. There is no rebound and no guarding.  No bruits. No liver or spleen enlargement. No masses palpable. No inguinal adenopathy with good bilateral inguinal pulses  Genitourinary:  Genitourinary Comments: Follow-up with urology as planned  Musculoskeletal: Normal range of motion. He exhibits deformity. He exhibits no edema or tenderness.  Bilateral foot deformity with no arches.  Lymphadenopathy:    He has no cervical adenopathy.   Neurological: He is alert and oriented to person, place, and time. He has normal reflexes. No cranial nerve deficit.  Skin: Skin is warm and dry. No rash noted.  Psychiatric: He has a normal mood and affect. His behavior is normal. Judgment and thought content normal.  Nursing note and vitals reviewed.   BP 127/76 (BP Location: Left Arm)   Pulse 71   Temp (!) 96.8 F (36 C) (Oral)   Ht '5\' 7"'$  (1.702 m)   Wt 155 lb (70.3 kg)   BMI 24.28 kg/m        Assessment & Plan:  1. Hypothyroidism due to acquired atrophy of thyroid -Continue current treatment pending results of lab work - CBC with Differential/Platelet - Thyroid Panel With TSH  2. Hyperlipidemia LDL goal <70 -Continue current treatment and there. Lifestyle changes with exercise program - BMP8+EGFR - CBC with Differential/Platelet - Hepatic function panel - Lipid panel  3. Benign prostatic hyperplasia, unspecified whether lower urinary tract symptoms present -Continue follow-up with urology - CBC with Differential/Platelet  4. Atrial fibrillation with RVR ([facility]) -Continue current treatment and follow-up with cardiology - BMP8+EGFR - CBC with Differential/Platelet  5. Vitamin B 12 deficiency -Continue current treatment pending results of lab work - CBC with Differential/Platelet  6. ASCVD (arteriosclerotic cardiovascular disease) -Continue follow-up with cardiology as needed - CBC with Differential/Platelet - Lipid panel  7. Gastroesophageal reflux disease, esophagitis presence not specified -Continue with proton pump inhibitor therapy and Carafate and watching diet - CBC with Differential/Platelet - Hepatic function panel  8. Vitamin D deficiency -Continue current treatment pending results of lab work - CBC with Differential/Platelet - VITAMIN D 25 Hydroxy (Vit-D Deficiency, Fractures)  9. COPD -Continue breathing treatments as doing and avoid irritating environments  Meds ordered this encounter   Medications  . doxycycline (VIBRA-TABS) 100 MG tablet    Sig: Take 1 tablet (100 mg total) by mouth 2 (two) times daily. 1 po bid    Dispense:  60 tablet    Refill:  1   Patient Instructions                       Medicare Annual Wellness Visit  [place] and the medical providers at [facility] strive to bring you the best medical care.  In doing so we not only want to address your current medical conditions and concerns but also to detect new conditions early and prevent illness, disease and health-related problems.    Medicare offers a yearly Wellness Visit which allows our clinical staff to assess your need for preventative services including immunizations, lifestyle education, counseling to decrease  risk of preventable diseases and screening for fall risk and other medical concerns.    This visit is provided free of charge (no copay) for all Medicare recipients. The clinical pharmacists at [facility] have begun to conduct these Wellness Visits which will also include a thorough review of all your medications.    As you primary medical provider recommend that you make an appointment for your Annual Wellness Visit if you have not done so already this year.  You may set up this appointment before you leave today or you may call back ([phone]) and schedule an appointment.  Please make sure when you call that you mention that you are scheduling your Annual Wellness Visit with the clinical pharmacist so that the appointment may be made for the proper length of time.     Continue current medications. Continue good therapeutic lifestyle changes which include good diet and exercise. Fall precautions discussed with patient. If an FOBT was given today- please return it to our front desk. If you are over 61 years old - you may need Prevnar 50 or the adult Pneumonia vaccine.  **Flu shots are available--- please call and schedule a FLU-CLINIC  appointment**  After your visit with [place] today you will receive a survey in the mail or online from [employer] regarding your care with [place]. Please take a moment to fill this out. Your feedback is very important to [place] as you can help [place] better understand your patient needs as well as improve your experience and satisfaction. WE CARE ABOUT YOU!!!  Continue with physical therapy Continue follow-up with urology Always be careful and did not put yourself at risk for falling This winter drink plenty of fluids and stay well hydrated and keep the house as cool as possible   Arrie Senate MD

## 2016-02-02 LAB — HEPATIC FUNCTION PANEL
ALT: 13 IU/L (ref 0–44)
AST: 20 IU/L (ref 0–40)
Albumin: 3.9 g/dL (ref 3.5–4.7)
Alkaline Phosphatase: 90 IU/L (ref 39–117)
Bilirubin Total: 0.4 mg/dL (ref 0.0–1.2)
Bilirubin, Direct: 0.14 mg/dL (ref 0.00–0.40)
Total Protein: 6.5 g/dL (ref 6.0–8.5)

## 2016-02-02 LAB — CBC WITH DIFFERENTIAL/PLATELET
Basophils Absolute: 0.1 x10E3/uL (ref 0.0–0.2)
Basos: 1 %
EOS (ABSOLUTE): 0.3 x10E3/uL (ref 0.0–0.4)
Eos: 3 %
Hematocrit: 38 % (ref 37.5–51.0)
Hemoglobin: 12.6 g/dL — ABNORMAL LOW (ref 13.0–17.7)
Immature Grans (Abs): 0.3 x10E3/uL — ABNORMAL HIGH (ref 0.0–0.1)
Immature Granulocytes: 3 %
Lymphocytes Absolute: 1.4 x10E3/uL (ref 0.7–3.1)
Lymphs: 14 %
MCH: 31.6 pg (ref 26.6–33.0)
MCHC: 33.2 g/dL (ref 31.5–35.7)
MCV: 95 fL (ref 79–97)
Monocytes Absolute: 1.4 x10E3/uL — ABNORMAL HIGH (ref 0.1–0.9)
Monocytes: 14 %
Neutrophils Absolute: 6.6 x10E3/uL (ref 1.4–7.0)
Neutrophils: 65 %
Platelets: 263 x10E3/uL (ref 150–379)
RBC: 3.99 x10E6/uL — ABNORMAL LOW (ref 4.14–5.80)
RDW: 15.3 % (ref 12.3–15.4)
WBC: 10 x10E3/uL (ref 3.4–10.8)

## 2016-02-02 LAB — BMP8+EGFR
BUN/Creatinine Ratio: 18 (ref 10–24)
BUN: 15 mg/dL (ref 8–27)
CO2: 20 mmol/L (ref 18–29)
Calcium: 9 mg/dL (ref 8.6–10.2)
Chloride: 104 mmol/L (ref 96–106)
Creatinine, Ser: 0.85 mg/dL (ref 0.76–1.27)
GFR calc Af Amer: 94 mL/min/1.73
GFR calc non Af Amer: 82 mL/min/1.73
Glucose: 76 mg/dL (ref 65–99)
Potassium: 4.4 mmol/L (ref 3.5–5.2)
Sodium: 140 mmol/L (ref 134–144)

## 2016-02-02 LAB — LIPID PANEL
Chol/HDL Ratio: 3.2 ratio (ref 0.0–5.0)
Cholesterol, Total: 189 mg/dL (ref 100–199)
HDL: 59 mg/dL
LDL Calculated: 114 mg/dL — ABNORMAL HIGH (ref 0–99)
Triglycerides: 78 mg/dL (ref 0–149)
VLDL Cholesterol Cal: 16 mg/dL (ref 5–40)

## 2016-02-02 LAB — THYROID PANEL WITH TSH
Free Thyroxine Index: 1.7 (ref 1.2–4.9)
T3 Uptake Ratio: 28 % (ref 24–39)
T4, Total: 6.2 ug/dL (ref 4.5–12.0)
TSH: 3.12 u[IU]/mL (ref 0.450–4.500)

## 2016-02-02 LAB — VITAMIN D 25 HYDROXY (VIT D DEFICIENCY, FRACTURES): Vit D, 25-Hydroxy: 52.6 ng/mL (ref 30.0–100.0)

## 2016-02-10 ENCOUNTER — Ambulatory Visit (INDEPENDENT_AMBULATORY_CARE_PROVIDER_SITE_OTHER): Payer: Medicare Other | Admitting: Urology

## 2016-02-10 DIAGNOSIS — C61 Malignant neoplasm of prostate: Secondary | ICD-10-CM | POA: Diagnosis not present

## 2016-02-10 DIAGNOSIS — N401 Enlarged prostate with lower urinary tract symptoms: Secondary | ICD-10-CM

## 2016-02-10 DIAGNOSIS — R351 Nocturia: Secondary | ICD-10-CM | POA: Diagnosis not present

## 2016-02-17 ENCOUNTER — Other Ambulatory Visit: Payer: Self-pay | Admitting: Family Medicine

## 2016-02-17 ENCOUNTER — Other Ambulatory Visit: Payer: Self-pay | Admitting: Endocrinology

## 2016-02-25 ENCOUNTER — Other Ambulatory Visit: Payer: Self-pay | Admitting: Endocrinology

## 2016-02-26 ENCOUNTER — Ambulatory Visit (INDEPENDENT_AMBULATORY_CARE_PROVIDER_SITE_OTHER): Payer: Medicare Other | Admitting: Family Medicine

## 2016-02-26 ENCOUNTER — Ambulatory Visit (INDEPENDENT_AMBULATORY_CARE_PROVIDER_SITE_OTHER): Payer: Medicare Other

## 2016-02-26 ENCOUNTER — Encounter: Payer: Self-pay | Admitting: Family Medicine

## 2016-02-26 VITALS — BP 111/65 | HR 95 | Temp 97.0°F | Ht 67.0 in | Wt 155.0 lb

## 2016-02-26 DIAGNOSIS — R05 Cough: Secondary | ICD-10-CM

## 2016-02-26 DIAGNOSIS — J449 Chronic obstructive pulmonary disease, unspecified: Secondary | ICD-10-CM

## 2016-02-26 DIAGNOSIS — R059 Cough, unspecified: Secondary | ICD-10-CM

## 2016-02-26 DIAGNOSIS — J209 Acute bronchitis, unspecified: Secondary | ICD-10-CM | POA: Diagnosis not present

## 2016-02-26 LAB — CBC WITH DIFFERENTIAL/PLATELET
Basophils Absolute: 0.1 10*3/uL (ref 0.0–0.2)
Basos: 0 %
EOS (ABSOLUTE): 0.1 10*3/uL (ref 0.0–0.4)
Eos: 0 %
Hematocrit: 36 % — ABNORMAL LOW (ref 37.5–51.0)
Hemoglobin: 12.1 g/dL — ABNORMAL LOW (ref 13.0–17.7)
Immature Grans (Abs): 0.1 10*3/uL (ref 0.0–0.1)
Immature Granulocytes: 1 %
Lymphocytes Absolute: 1.2 10*3/uL (ref 0.7–3.1)
Lymphs: 7 %
MCH: 31.6 pg (ref 26.6–33.0)
MCHC: 33.6 g/dL (ref 31.5–35.7)
MCV: 94 fL (ref 79–97)
Monocytes Absolute: 2.2 10*3/uL — ABNORMAL HIGH (ref 0.1–0.9)
Monocytes: 14 %
Neutrophils Absolute: 12.4 10*3/uL — ABNORMAL HIGH (ref 1.4–7.0)
Neutrophils: 78 %
Platelets: 237 10*3/uL (ref 150–379)
RBC: 3.83 x10E6/uL — ABNORMAL LOW (ref 4.14–5.80)
RDW: 14.9 % (ref 12.3–15.4)
WBC: 16 10*3/uL — ABNORMAL HIGH (ref 3.4–10.8)

## 2016-02-26 MED ORDER — LEVOFLOXACIN 500 MG PO TABS: 500.0000 mg | ORAL_TABLET | Freq: Every day | ORAL | 0 refills | Status: DC

## 2016-02-26 MED ORDER — ALBUTEROL SULFATE HFA 108 (90 BASE) MCG/ACT IN AERS: 2.0000 | INHALATION_SPRAY | Freq: Four times a day (QID) | RESPIRATORY_TRACT | 11 refills | Status: DC | PRN

## 2016-02-26 NOTE — Progress Notes (Signed)
Subjective:    Patient ID: [name], male    DOB: [birth year], 82 y.o.   MRN: [id number]  HPI Patient here today for cough, congestion and fever that started 3 days ago. He is accompanied today by his son.The patient has had a low grade fever. He started doxycycline 3 days ago and has had 604. He is requesting a refill on his albuterol. Other than this congestion, the patient is stable otherwise. He is still mourning the loss of his wife who died before Thanksgiving. He says the sputum seems to return from yellow to more gray colored just recently. He is stable with his intake. He has no other symptoms and the cough and congestion.     Patient Active Problem List   Diagnosis Date Noted  . Abdominal pain, epigastric   . Dysphagia   . Atrial fibrillation with rapid ventricular response ([place])   . Elevated troponin   . Atrial fibrillation with RVR ([place]) 06/12/2015  . Pneumonia 06/12/2015  . Leukocytosis 06/12/2015  . Clot retention of urine 06/11/2015  . BPH (benign prostatic hypertrophy) with urinary obstruction 06/11/2015  . Postoperative anemia due to acute blood loss 06/11/2015  . Hematuria 06/10/2015  . Neck pain, bilateral 12/31/2013  . Neuropathy ([place]) 12/31/2013  . Vitamin B 12 deficiency 12/31/2013  . Abnormality of gait 06/07/2013  . Esophageal ulcer with bleeding 04/20/2013  . S/P left and ritght THA, AA 07/21/2011  . Special screening for malignant neoplasms, colon 01/03/2011  . Personal history of colonic polyps 01/03/2011  . COPD GOLD I 08/24/2010  . GI BLEED 11/17/2009  . ESOPHAGEAL REFLUX 08/25/2009  . BLOOD IN STOOL-MELENA 08/25/2009  . COLONIC POLYPS 07/17/2008  . ANEMIA 07/17/2008  . HYPOTENSION 07/17/2008  . ESOPHAGITIS 07/17/2008  . DIVERTICULOSIS, MILD 07/17/2008  . DEGENERATIVE JOINT DISEASE 07/17/2008  . Osteoporosis 07/17/2008  . BPH (benign prostatic hyperplasia) 07/17/2008  . Hyperprolactinemia ([place]) 10/24/2007  . PITUITARY ADENOMA 06/25/2007    . Hypothyroidism 06/25/2007  . Hyperlipidemia LDL goal <70 06/22/2007  . Coronary atherosclerosis 06/22/2007   Outpatient Encounter Prescriptions as of 02/26/2016  Medication Sig  . albuterol (PROVENTIL HFA;VENTOLIN HFA) 108 (90 BASE) MCG/ACT inhaler Inhale 2 puffs into the lungs every 6 (six) hours as needed for wheezing or shortness of breath.  Marland Kitchen arformoterol (BROVANA) 15 MCG/2ML NEBU Take 15 mcg by nebulization 2 (two) times daily as needed.  . bisacodyl (DULCOLAX) 10 MG suppository Place 10 mg rectally as needed for moderate constipation.  . bromocriptine (PARLODEL) 2.5 MG tablet Take 2.5 mg by mouth 2 (two) times daily.  . calcium carbonate (OSCAL) 1500 (600 Ca) MG TABS tablet Take 600 mg of elemental calcium by mouth 2 (two) times daily with a meal.   . cholecalciferol (VITAMIN D) 1000 UNITS tablet Take 1,000 Units by mouth daily.   Marland Kitchen diltiazem (DILACOR XR) 120 MG 24 hr capsule Take 120 mg by mouth daily.  Marland Kitchen doxycycline (VIBRA-TABS) 100 MG tablet Take 1 tablet (100 mg total) by mouth 2 (two) times daily. 1 po bid  . ferrous sulfate 325 (65 FE) MG tablet Take 325 mg by mouth daily with breakfast.   . guaiFENesin (MUCINEX) 600 MG 12 hr tablet Take 600 mg by mouth daily.   Marland Kitchen levothyroxine (SYNTHROID, LEVOTHROID) 50 MCG tablet TAKE 1 TABLET IN MORNING  . magnesium oxide (MAG-OX) 400 (241.3 Mg) MG tablet Take 400 mg by mouth every other day.  . montelukast (SINGULAIR) 10 MG tablet Take 10 mg by mouth  at bedtime as needed (for allergies).   . naphazoline-pheniramine (NAPHCON-A) 0.025-0.3 % ophthalmic solution Place 1 drop into both eyes as needed for irritation.  . nitroGLYCERIN (NITROSTAT) 0.4 MG SL tablet Place 0.4 mg under the tongue every 5 (five) minutes as needed for chest pain.   . pantoprazole (PROTONIX) 40 MG tablet Take 1 tablet (40 mg total) by mouth 2 (two) times daily before a meal.  . polyethylene glycol (MIRALAX / GLYCOLAX) packet Take 17 g by mouth daily.  . sucralfate  (CARAFATE) 1 g tablet Take 1 tablet (1 g total) by mouth 4 (four) times daily as needed (for GI upset).  . SYMBICORT 160-4.5 MCG/ACT inhaler Inhale 2 puffs into the lungs 2 (two) times daily.  . traMADol-acetaminophen (ULTRACET) 37.5-325 MG tablet Take 1 tablet by mouth every 6 (six) hours as needed for moderate pain.  . vitamin B-12 (CYANOCOBALAMIN) 1000 MCG tablet Take 1,000 mcg by mouth daily.  . vitamin E (VITAMIN E) 400 UNIT capsule Take 400 Units by mouth daily.  . [DISCONTINUED] diltiazem (CARDIZEM CD) 120 MG 24 hr capsule TAKE (1) CAPSULE DAILY   No facility-administered encounter medications on file as of 02/26/2016.       Review of Systems  Constitutional: Positive for fever (low grade).  HENT: Positive for congestion (yellow - grey).   Eyes: Negative.   Respiratory: Positive for cough.   Cardiovascular: Negative.   Gastrointestinal: Negative.   Endocrine: Negative.   Genitourinary: Negative.   Musculoskeletal: Negative.   Skin: Negative.   Allergic/Immunologic: Negative.   Neurological: Negative.   Hematological: Negative.   Psychiatric/Behavioral: Negative.        Objective:   Physical Exam  Constitutional: He is oriented to person, place, and time. He appears well-developed and well-nourished. He appears distressed.  The patient was tearful in talking about his wife who passed away recently.  HENT:  Head: Normocephalic and atraumatic.  Right Ear: External ear normal.  Left Ear: External ear normal.  Mouth/Throat: Oropharynx is clear and moist. No oropharyngeal exudate.  Slight nasal congestion  Eyes: Conjunctivae and EOM are normal. Pupils are equal, round, and reactive to light. Right eye exhibits no discharge. Left eye exhibits no discharge. No scleral icterus.  Neck: Normal range of motion. Neck supple. No thyromegaly present.  No adenopathy  Cardiovascular: Normal rate, regular rhythm and normal heart sounds.   No murmur heard. The heart was regular at  84/m  Pulmonary/Chest: Effort normal. No respiratory distress. He has no wheezes. He has no rales. He exhibits no tenderness.  Slightly decreased air movement but no rales or wheezes and the patient had a dry cough.  Abdominal: Soft. Bowel sounds are normal. He exhibits no mass. There is no tenderness. There is no rebound and no guarding.  Musculoskeletal: Normal range of motion. He exhibits no edema.  Lymphadenopathy:    He has no cervical adenopathy.  Neurological: He is alert and oriented to person, place, and time.  Skin: Skin is warm and dry. No rash noted.  Psychiatric: He has a normal mood and affect. His behavior is normal. Judgment and thought content normal.  Nursing note and vitals reviewed.  BP 111/65 (BP Location: Left Arm)   Pulse 95   Temp 97 F (36.1 C) (Oral)   Ht 5\' 7"  (1.702 m)   Wt 155 lb (70.3 kg)   BMI 24.28 kg/m         Assessment & Plan:  1. Cough -Take Mucinex maximum strength, blue and white  in color, plain 1 twice daily with a large glass of water for cough and congestion -Continue with regular use of inhalers - CBC with Differential/Platelet - DG Chest 2 View; Future  2. Acute bronchitis with bronchospasm -Continue with doxycycline 100 mg twice daily with food for at least 10-14 days  3. COPD GOLD I -Continue with inhalers, Mucinex, nasal saline, cool mist humidification, and drinking plenty of fluids and staying well-hydrated.  Patient Instructions  Take Mucinex maximum strength regularly twice daily for cough and congestion Continue with inhalers Drink plenty of fluids and stay well hydrated Take doxycycline twice daily with food for 10-14 days Call the office if there is any worsening of patient's condition We will call with lab work including the CBC and chest x-ray results as soon as they become available Use cool mist humidification and keep the house as cool as possible  Arrie Senate MD

## 2016-02-26 NOTE — Addendum Note (Signed)
Addended by: Zannie Cove on: 02/26/2016 11:40 AM   Modules accepted: Orders

## 2016-02-26 NOTE — Addendum Note (Signed)
Addended by: Zannie Cove on: 02/26/2016 10:36 AM   Modules accepted: Orders

## 2016-02-26 NOTE — Patient Instructions (Addendum)
Take Mucinex maximum strength regularly twice daily for cough and congestion Continue with inhalers Drink plenty of fluids and stay well hydrated Take doxycycline twice daily with food for 10-14 days Call the office if there is any worsening of patient's condition We will call with lab work including the CBC and chest x-ray results as soon as they become available Use cool mist humidification and keep the house as cool as possible

## 2016-03-01 ENCOUNTER — Encounter: Payer: Self-pay | Admitting: Family Medicine

## 2016-03-01 ENCOUNTER — Other Ambulatory Visit: Payer: Self-pay | Admitting: *Deleted

## 2016-03-01 MED ORDER — ARFORMOTEROL TARTRATE 15 MCG/2ML IN NEBU: 15.0000 ug | INHALATION_SOLUTION | Freq: Two times a day (BID) | RESPIRATORY_TRACT | 3 refills | Status: DC | PRN

## 2016-03-02 ENCOUNTER — Encounter: Payer: Self-pay | Admitting: Family Medicine

## 2016-03-03 ENCOUNTER — Encounter: Payer: Self-pay | Admitting: Family Medicine

## 2016-03-04 ENCOUNTER — Ambulatory Visit: Payer: Medicare Other | Admitting: Endocrinology

## 2016-03-22 ENCOUNTER — Encounter: Payer: Self-pay | Admitting: Family Medicine

## 2016-03-23 ENCOUNTER — Emergency Department (HOSPITAL_COMMUNITY)
Admission: EM | Admit: 2016-03-23 | Discharge: 2016-03-23 | Disposition: A | Discharge status: Home / Self Care | Payer: Medicare Other | Attending: Emergency Medicine | Admitting: Emergency Medicine

## 2016-03-23 ENCOUNTER — Telehealth: Payer: Self-pay | Admitting: Internal Medicine

## 2016-03-23 ENCOUNTER — Encounter (HOSPITAL_COMMUNITY): Payer: Self-pay | Admitting: Emergency Medicine

## 2016-03-23 DIAGNOSIS — Z955 Presence of coronary angioplasty implant and graft: Secondary | ICD-10-CM | POA: Diagnosis not present

## 2016-03-23 DIAGNOSIS — K210 Gastro-esophageal reflux disease with esophagitis: Secondary | ICD-10-CM

## 2016-03-23 DIAGNOSIS — Z96643 Presence of artificial hip joint, bilateral: Secondary | ICD-10-CM | POA: Insufficient documentation

## 2016-03-23 DIAGNOSIS — E039 Hypothyroidism, unspecified: Secondary | ICD-10-CM | POA: Diagnosis not present

## 2016-03-23 DIAGNOSIS — I2510 Atherosclerotic heart disease of native coronary artery without angina pectoris: Secondary | ICD-10-CM | POA: Insufficient documentation

## 2016-03-23 DIAGNOSIS — R1110 Vomiting, unspecified: Secondary | ICD-10-CM

## 2016-03-23 DIAGNOSIS — Z85828 Personal history of other malignant neoplasm of skin: Secondary | ICD-10-CM | POA: Insufficient documentation

## 2016-03-23 DIAGNOSIS — K222 Esophageal obstruction: Secondary | ICD-10-CM | POA: Diagnosis not present

## 2016-03-23 DIAGNOSIS — J449 Chronic obstructive pulmonary disease, unspecified: Secondary | ICD-10-CM | POA: Insufficient documentation

## 2016-03-23 DIAGNOSIS — J45909 Unspecified asthma, uncomplicated: Secondary | ICD-10-CM | POA: Diagnosis not present

## 2016-03-23 DIAGNOSIS — R1310 Dysphagia, unspecified: Secondary | ICD-10-CM | POA: Diagnosis not present

## 2016-03-23 NOTE — ED Provider Notes (Signed)
[facility] DEPT Provider Note   CSN: [id number] Arrival date & time: 03/23/16  1213     History   Chief Complaint Chief Complaint  Patient presents with  . Dysphagia    HPI [name] is a 83 y.o. male.  [name] is a 83 y.o. Male with a PMH significant for Barrett's esophagus, esophageal strictures with previous dilations, hiatal hernia, and Nissen procedure 4 years ago. This morning after breakfast, he developed an acute onset of sharp, constant, non-radiating epigastric pain that persisted for 45 minutes and was followed by vomiting. The pain was completely relieved by vomiting. Later, the pt attempted to drink water which caused the symptoms to return. Again, an episode of vomiting relieved the pain where he vomited up the cereal he ate. He reports he has had problems with food impactions before. Pt denies cp, SOB,  dyspnea, odynophagia, nausea,diarrhea, hematemesis, and hematochezia.     The history is provided by the patient.    Past Medical History:  Diagnosis Date  . Abnormality of gait 06/07/2013  . Anemia   . Anginal pain (HCC)    hx of   . Arthritis   . Asthma   . Atrial fibrillation ([place])   . Blood transfusion june 2011, 5 units given   july 2011 some units given  . CAD (coronary artery disease) 03/2006   3.0 x 20 mm TAXUS Perseus DES to the LAD; 01/2007  L main 30%, oLAD 50%, pLAD stent ok, CFX 80%, OM 60%, pRCA 60%, mRCA 70%, oPDA 90%; med rx   . Cancer ([place])    skin cancer   . Cataract    bilateral removal of cateracts  . Complication of anesthesia     no issues,but pt prefers spinal due to Pulmonary problems  . COPD (chronic obstructive pulmonary disease) ([place])    oxygen  on standby in home.  . Depression   . Diverticulosis   . Dizziness    occasional  . Emphysema   . Enlarged prostate    elevated psa recently  . Enlarged prostate with urinary retention   . Esophageal stenosis   . GERD (gastroesophageal reflux disease)   . Hiatal  hernia   . Hypothyroidism   . Mass, scrotum    L scrotum - Sees Dr. Jeffie Pollock  . Neuropathy ([place])   . Pituitary macroadenoma ([place])   . Pneumonia    No recent.  Marland Kitchen Restless legs syndrome (RLS)   . Transfusion history    history 4 yrs ago.   Marland Kitchen UGIB (upper gastrointestinal bleed) 03/2013   EGD w/ large ulcer at GE junction  . UGIB (upper gastrointestinal bleed) 08/09/2009   At GE junction, rx w/ endoclips  . UGIB (upper gastrointestinal bleed) 09/2009   just above GE junction, rx  w/ 2 endoclips    Patient Active Problem List   Diagnosis Date Noted  . Abdominal pain, epigastric   . Dysphagia   . Atrial fibrillation with rapid ventricular response ([place])   . Elevated troponin   . Atrial fibrillation with RVR ([place]) 06/12/2015  . Pneumonia 06/12/2015  . Leukocytosis 06/12/2015  . Clot retention of urine 06/11/2015  . BPH (benign prostatic hypertrophy) with urinary obstruction 06/11/2015  . Postoperative anemia due to acute blood loss 06/11/2015  . Hematuria 06/10/2015  . Neck pain, bilateral 12/31/2013  . Neuropathy ([place]) 12/31/2013  . Vitamin B 12 deficiency 12/31/2013  . Abnormality of gait 06/07/2013  . Esophageal ulcer with bleeding 04/20/2013  .  S/P left and ritght THA, AA 07/21/2011  . Special screening for malignant neoplasms, colon 01/03/2011  . Personal history of colonic polyps 01/03/2011  . COPD GOLD I 08/24/2010  . GI BLEED 11/17/2009  . ESOPHAGEAL REFLUX 08/25/2009  . BLOOD IN STOOL-MELENA 08/25/2009  . COLONIC POLYPS 07/17/2008  . ANEMIA 07/17/2008  . HYPOTENSION 07/17/2008  . ESOPHAGITIS 07/17/2008  . DIVERTICULOSIS, MILD 07/17/2008  . DEGENERATIVE JOINT DISEASE 07/17/2008  . Osteoporosis 07/17/2008  . BPH (benign prostatic hyperplasia) 07/17/2008  . Hyperprolactinemia ([place]) 10/24/2007  . PITUITARY ADENOMA 06/25/2007  . Hypothyroidism 06/25/2007  . Hyperlipidemia LDL goal <70 06/22/2007  . Coronary atherosclerosis 06/22/2007    Past Surgical History:    Procedure Laterality Date  . ABDOMINAL HERNIA REPAIR   2008  . CARDIAC CATHETERIZATION  01/2007   L main 30%, oLAD 50%,  pLAD stent ok, CFX 80%, OM 60%, pRCA 60%, mRCA 70%, oPDA 90%; med rx  . cataract extraction both eyes    . COLONOSCOPY    . CORONARY STENT PLACEMENT  03/2006   3.0 x 20 mm TAXUS Perseus DES to the LAD  . CYSTOSCOPY N/A 06/10/2015   Procedure: CYSTOSCOPY FULGRATION OF BLEEDING,  electovapor resection;  Surgeon: Irine Seal, MD;  Location: [facility];  Service: Urology;  Laterality: N/A;  . CYSTOSCOPY WITH INSERTION OF UROLIFT N/A 06/01/2015   Procedure: CYSTOSCOPY WITH INSERTION OF UROLIFT x4;  Surgeon: Irine Seal, MD;  Location: [facility];  Service: Urology;  Laterality: N/A;  . ESOPHAGOGASTRODUODENOSCOPY N/A 04/20/2013   Procedure: ESOPHAGOGASTRODUODENOSCOPY (EGD);  Surgeon: Inda Castle, MD;  Location: [facility];  Service: Endoscopy;  Laterality: N/A;  . ESOPHAGOGASTRODUODENOSCOPY (EGD) WITH PROPOFOL N/A 10/13/2015   Procedure: ESOPHAGOGASTRODUODENOSCOPY (EGD) WITH PROPOFOL;  Surgeon: Jerene Bears, MD;  Location: [facility];  Service: Gastroenterology;  Laterality: N/A;  . FOOT SURGERY  1994 left, 2002 right foot   bilateral foot reconstruciton  . FOOT SURGERY     reconstruction of both feet- no retained hardware.  Marland Kitchen HIATAL HERNIA REPAIR  01-04-2008  . PROSTATE SURGERY     x 2  . TOTAL HIP ARTHROPLASTY  07/21/2011   Procedure: TOTAL HIP ARTHROPLASTY ANTERIOR APPROACH;  Surgeon: Mauri Pole, MD;  Location: [facility];  Service: Orthopedics;  Laterality: Left;  . TOTAL HIP ARTHROPLASTY Right 09/07/2012   Procedure: RIGHT TOTAL HIP ARTHROPLASTY ANTERIOR APPROACH;  Surgeon: Mcarthur Rossetti, MD;  Location: [facility];  Service: Orthopedics;  Laterality: Right;  . TRANSURETHRAL RESECTION OF BLADDER NECK N/A 11/04/2015   Procedure: RESECTION OF BLADDER NECK;  Surgeon: Cleon Gustin, MD;  Location: [facility];  Service: Urology;  Laterality: N/A;  . TRANSURETHRAL RESECTION OF  PROSTATE N/A 11/04/2015   Procedure: TRANSURETHRAL RESECTION OF THE PROSTATE (TURP); REMOVAL OF UROLIFT IMPLANTS X THREE;  Surgeon: Cleon Gustin, MD;  Location: [facility];  Service: Urology;  Laterality: N/A;  . UPPER GASTROINTESTINAL ENDOSCOPY  12/2013   Dr Hilarie Fredrickson, gastritis       Home Medications    Prior to Admission medications   Medication Sig Start Date End Date Taking? Authorizing Provider  albuterol (PROVENTIL HFA;VENTOLIN HFA) 108 (90 Base) MCG/ACT inhaler Inhale 2 puffs into the lungs every 6 (six) hours as needed for wheezing or shortness of breath. 02/26/16  Yes Chipper Herb, MD  arformoterol (BROVANA) 15 MCG/2ML NEBU Take 2 mLs (15 mcg total) by nebulization 2 (two) times daily as needed. Patient taking differently: Take 15 mcg by nebulization 2 (two) times daily as  needed (shortness of breath).  03/01/16  Yes Chipper Herb, MD  bisacodyl (DULCOLAX) 10 MG suppository Place 10 mg rectally as needed for moderate constipation.   Yes Historical Provider, MD  bromocriptine (PARLODEL) 2.5 MG tablet Take 2.5 mg by mouth 2 (two) times daily.   Yes Historical Provider, MD  calcium carbonate (OSCAL) 1500 (600 Ca) MG TABS tablet Take 600 mg of elemental calcium by mouth 2 (two) times daily with a meal.    Yes Historical Provider, MD  cholecalciferol (VITAMIN D) 1000 UNITS tablet Take 1,000 Units by mouth daily.    Yes Historical Provider, MD  diltiazem (DILACOR XR) 120 MG 24 hr capsule Take 120 mg by mouth daily.   Yes Historical Provider, MD  ferrous sulfate 325 (65 FE) MG tablet Take 325 mg by mouth daily with breakfast.    Yes Historical Provider, MD  guaiFENesin (MUCINEX) 600 MG 12 hr tablet Take 600 mg by mouth 2 (two) times daily.    Yes Historical Provider, MD  levothyroxine (SYNTHROID, LEVOTHROID) 50 MCG tablet TAKE 1 TABLET IN MORNING Patient taking differently: TAKE 50mg  TABLET IN MORNING 02/25/16  Yes Renato Shin, MD  magnesium oxide (MAG-OX) 400 (241.3 Mg) MG tablet Take 400  mg by mouth every other day.   Yes Historical Provider, MD  montelukast (SINGULAIR) 10 MG tablet Take 10 mg by mouth at bedtime as needed (for allergies).    Yes Historical Provider, MD  naphazoline-pheniramine (NAPHCON-A) 0.025-0.3 % ophthalmic solution Place 1 drop into both eyes 2 (two) times daily as needed for irritation or allergies.    Yes Historical Provider, MD  nitroGLYCERIN (NITROSTAT) 0.4 MG SL tablet Place 0.4 mg under the tongue every 5 (five) minutes as needed for chest pain.    Yes Historical Provider, MD  pantoprazole (PROTONIX) 40 MG tablet Take 1 tablet (40 mg total) by mouth 2 (two) times daily before a meal. 12/23/15  Yes Jerene Bears, MD  polyethylene glycol (MIRALAX / GLYCOLAX) packet Take 17 g by mouth daily. Patient taking differently: Take 17 g by mouth daily as needed for moderate constipation.  12/23/15  Yes Jerene Bears, MD  sucralfate (CARAFATE) 1 g tablet Take 1 tablet (1 g total) by mouth 4 (four) times daily as needed (for GI upset). 12/23/15  Yes Jerene Bears, MD  SYMBICORT 160-4.5 MCG/ACT inhaler Inhale 2 puffs into the lungs 2 (two) times daily. 02/02/16  Yes Chipper Herb, MD  vitamin B-12 (CYANOCOBALAMIN) 1000 MCG tablet Take 1,000 mcg by mouth daily.   Yes Historical Provider, MD  vitamin E (VITAMIN E) 400 UNIT capsule Take 400 Units by mouth daily.   Yes Historical Provider, MD  doxycycline (VIBRA-TABS) 100 MG tablet Take 1 tablet (100 mg total) by mouth 2 (two) times daily. 1 po bid Patient not taking: Reported on 03/23/2016 02/01/16   Chipper Herb, MD  levofloxacin (LEVAQUIN) 500 MG tablet Take 1 tablet (500 mg total) by mouth daily. Patient not taking: Reported on 03/23/2016 02/26/16   Chipper Herb, MD    Family History Family History  Problem Relation Age of Onset  . Heart disease Sister   . Heart failure Father   . Colon cancer Neg Hx   . Esophageal cancer Neg Hx   . Rectal cancer Neg Hx   . Stomach cancer Neg Hx     Social History Social  History  Substance Use Topics  . Smoking status: Never Smoker  . Smokeless tobacco: Never Used  .  Alcohol use No     Allergies   Betadine [povidone iodine]; Lortab [hydrocodone-acetaminophen]; Penicillins; Zetia [ezetimibe]; and Zocor [simvastatin]   Review of Systems Review of Systems  Constitutional: Negative for appetite change, chills and fever.  HENT: Negative for congestion, drooling, sore throat and trouble swallowing.   Eyes: Negative for visual disturbance.  Respiratory: Negative for cough, chest tightness, shortness of breath and wheezing.   Cardiovascular: Negative for chest pain and palpitations.  Gastrointestinal: Positive for vomiting. Negative for abdominal distention, abdominal pain, blood in stool, diarrhea and nausea.  Genitourinary: Negative for dysuria.  Musculoskeletal: Negative for back pain and neck pain.  Skin: Negative for rash.  Neurological: Negative for headaches.  Hematological: Negative for adenopathy.     Physical Exam Updated Vital Signs BP 123/69 (BP Location: Left Arm)   Pulse (!) 56   Temp 97.7 F (36.5 C) (Oral)   Resp 16   Wt 68.5 kg   SpO2 99%   BMI 23.65 kg/m   Physical Exam  Constitutional: He is oriented to person, place, and time. He appears well-developed and well-nourished. No distress.  Pleasant, well-appearing.  HENT:  Head: Normocephalic and atraumatic.  Right Ear: External ear normal.  Left Ear: External ear normal.  Mouth/Throat: Oropharynx is clear and moist.  Oropharynx is clear. No stridor. No drooling.  Eyes: Conjunctivae are normal. Pupils are equal, round, and reactive to light. No scleral icterus.  Neck: Neck supple. No JVD present.  Cardiovascular: Normal rate, regular rhythm, normal heart sounds and intact distal pulses.  Exam reveals no gallop and no friction rub.   No murmur heard. Pulmonary/Chest: Effort normal and breath sounds normal. No stridor. No respiratory distress. He has no wheezes. He has no  rales. He exhibits no tenderness.  Lungs CTAB  Abdominal: Soft. He exhibits no distension and no mass. There is no tenderness. There is no guarding.  Edward Jolly is soft and nontender to palpation.  Musculoskeletal: He exhibits no edema.  Lymphadenopathy:    He has no cervical adenopathy.  Neurological: He is alert and oriented to person, place, and time.  Skin: Skin is warm and dry. Capillary refill takes less than 2 seconds. No rash noted. He is not diaphoretic. No erythema. No pallor.  Psychiatric: He has a normal mood and affect. His behavior is normal.  Nursing note and vitals reviewed.    ED Treatments / Results  Labs (all labs ordered are listed, but only abnormal results are displayed) Labs Reviewed - No data to display  EKG  EKG Interpretation None       Radiology No results found.  Procedures Procedures (including critical care time)  Medications Ordered in ED Medications - No data to display   Initial Impression / Assessment and Plan / ED Course  I have reviewed the triage vital signs and the nursing notes.  Pertinent labs & imaging results that were available during my care of the patient were reviewed by me and considered in my medical decision making (see chart for details).   This is a 83 y.o. Male with a PMH significant for Barrett's esophagus, esophageal strictures with previous dilations, hiatal hernia, and Nissen procedure 4 years ago. This morning after breakfast, he developed an acute onset of sharp, constant, non-radiating epigastric pain that persisted for 45 minutes and was followed by vomiting. The pain was completely relieved by vomiting. Later, the pt attempted to drink water which caused the symptoms to return. Again, an episode of vomiting relieved the pain where he  vomited up the cereal he ate. On exam the patient is nontoxic appearing and afebrile. Throat is clear. No stridor. No drooling. Abdomen is soft and nontender to palpation. Lungs are  clear to auscultation.  Throat the patient with some water which he tolerated without difficulty. I called and consulted with GI NP Nevin Bloodgood who reports she'll be by to see the patient. NP Nevin Bloodgood and gastrologist Dr. Henrene Pastor came by to see the patient. They feel that he is safe to be discharged and would like him to continue his outpatient medications as prescribed and he will follow-up tomorrow in office. Patient is tolerating by mouth without difficulty. He is in no vomiting in the emergency department. He feels ready for discharge and is comfortable with this plan. I advised the patient to follow-up with their primary care provider this week. I advised the patient to return to the emergency department with new or worsening symptoms or new concerns. The patient verbalized understanding and agreement with plan.    This patient was discussed with Dr. Ralene Bathe who agrees with assessment and plan.   Final Clinical Impressions(s) / ED Diagnoses   Final diagnoses:  Dysphagia, unspecified type    New Prescriptions Discharge Medication List as of 03/23/2016  4:34 PM       Waynetta Pean, PA-C 03/23/16 [place], MD 03/24/16 [phone]

## 2016-03-23 NOTE — Consult Note (Signed)
Referring Provider: ED - Corinna Capra, P.A Primary Care Physician:  Redge Gainer, MD Primary Gastroenterologist:   Zenovia Jarred,  MD  Reason for Consultation:  dysphagia  / ? Food impaction  ASSESSMENT AND PLAN:   1. 83 yo male with dysphagia and known esophageal stricture. There was concern for food impaction at home this am but patient is able to tolerate secretions as well as water. -Patient needs EGD with dilation but this should be okay to have done as outpatient. We discussed eating precautions to avoid impaction. Will contact his primary GI, Dr. Hilarie Fredrickson about scheduling the procedure as outpatient -increase PPI to BID for now.    2. Epigastric pain. Pain resolved after vomiting this am. This is chronic intermittent and of has been previously evaluated by Dr. Hilarie Fredrickson.  LFTs are normal.     3. Leukocytosis. Etiology? He was recently treated for PNA. Lungs sound ok. Denies fevers. No urinary sx.  -Can repeat CBC at time of outpatient EGD     HPI: [name] is a 83 y.o. male known to Dr. Hilarie Fredrickson for a history of GERD with esophogeal ulceration, Barrett's esophagus and hiatal hernia s/p repair approximately 6 years ago. He has chronic, intermittent epigastric discomfort and solid food dysphagia. Patient's last EGD was August of this year. There was a moderate stenosis at 39cm from incisors. Scope traversed the area though there was a minor tear where scope went through so dilation wasn't done due to risk of bleeding. He is on a daily PPI as well as sucralfate BID.   This am patient felt like am Carafate didn't go down well. He was recently treated for PNA. This am he coughed up a lot of thick phlegm. While eating breakfast he developed severe non-radiating epigastric pain and subsequently vomited a small amount of undigested eggs. Pain resolved after vomiting. Patient has a history of intermittent epigastric pain which he calls indigestion. Some SOB but only associated with the severe  pain. No chest pain. No acute EKG changes. Patient has intermittent solid food dysphagia. He is taking a daily PPI   Past Medical History:  Diagnosis Date  . Abnormality of gait 06/07/2013  . Anemia   . Anginal pain (HCC)    hx of   . Arthritis   . Asthma   . Atrial fibrillation ([place])   . Blood transfusion june 2011, 5 units given   july 2011 some units given  . CAD (coronary artery disease) 03/2006   3.0 x 20 mm TAXUS Perseus DES to the LAD; 01/2007  L main 30%, oLAD 50%, pLAD stent ok, CFX 80%, OM 60%, pRCA 60%, mRCA 70%, oPDA 90%; med rx   . Cancer ([place])    skin cancer   . Cataract    bilateral removal of cateracts  . Complication of anesthesia     no issues,but pt prefers spinal due to Pulmonary problems  . COPD (chronic obstructive pulmonary disease) ([place])    oxygen  on standby in home.  . Depression   . Diverticulosis   . Dizziness    occasional  . Emphysema   . Enlarged prostate    elevated psa recently  . Enlarged prostate with urinary retention   . Esophageal stenosis   . GERD (gastroesophageal reflux disease)   . Hiatal hernia   . Hypothyroidism   . Mass, scrotum    L scrotum - Sees Dr. Jeffie Pollock  . Neuropathy ([place])   . Pituitary macroadenoma ([place])   .  Pneumonia    No recent.  Marland Kitchen Restless legs syndrome (RLS)   . Transfusion history    history 4 yrs ago.   Marland Kitchen UGIB (upper gastrointestinal bleed) 03/2013   EGD w/ large ulcer at GE junction  . UGIB (upper gastrointestinal bleed) 08/09/2009   At GE junction, rx w/ endoclips  . UGIB (upper gastrointestinal bleed) 09/2009   just above GE junction, rx  w/ 2 endoclips    Past Surgical History:  Procedure Laterality Date  . ABDOMINAL HERNIA REPAIR   2008  . CARDIAC CATHETERIZATION  01/2007   L main 30%, oLAD 50%,  pLAD stent ok, CFX 80%, OM 60%, pRCA 60%, mRCA 70%, oPDA 90%; med rx  . cataract extraction both eyes    . COLONOSCOPY    . CORONARY STENT PLACEMENT  03/2006   3.0 x 20 mm TAXUS Perseus DES to the LAD    . CYSTOSCOPY N/A 06/10/2015   Procedure: CYSTOSCOPY FULGRATION OF BLEEDING,  electovapor resection;  Surgeon: Irine Seal, MD;  Location: [facility];  Service: Urology;  Laterality: N/A;  . CYSTOSCOPY WITH INSERTION OF UROLIFT N/A 06/01/2015   Procedure: CYSTOSCOPY WITH INSERTION OF UROLIFT x4;  Surgeon: Irine Seal, MD;  Location: [facility];  Service: Urology;  Laterality: N/A;  . ESOPHAGOGASTRODUODENOSCOPY N/A 04/20/2013   Procedure: ESOPHAGOGASTRODUODENOSCOPY (EGD);  Surgeon: Inda Castle, MD;  Location: [facility];  Service: Endoscopy;  Laterality: N/A;  . ESOPHAGOGASTRODUODENOSCOPY (EGD) WITH PROPOFOL N/A 10/13/2015   Procedure: ESOPHAGOGASTRODUODENOSCOPY (EGD) WITH PROPOFOL;  Surgeon: Jerene Bears, MD;  Location: [facility];  Service: Gastroenterology;  Laterality: N/A;  . FOOT SURGERY  1994 left, 2002 right foot   bilateral foot reconstruciton  . FOOT SURGERY     reconstruction of both feet- no retained hardware.  Marland Kitchen HIATAL HERNIA REPAIR  01-04-2008  . PROSTATE SURGERY     x 2  . TOTAL HIP ARTHROPLASTY  07/21/2011   Procedure: TOTAL HIP ARTHROPLASTY ANTERIOR APPROACH;  Surgeon: Mauri Pole, MD;  Location: [facility];  Service: Orthopedics;  Laterality: Left;  . TOTAL HIP ARTHROPLASTY Right 09/07/2012   Procedure: RIGHT TOTAL HIP ARTHROPLASTY ANTERIOR APPROACH;  Surgeon: Mcarthur Rossetti, MD;  Location: [facility];  Service: Orthopedics;  Laterality: Right;  . TRANSURETHRAL RESECTION OF BLADDER NECK N/A 11/04/2015   Procedure: RESECTION OF BLADDER NECK;  Surgeon: Cleon Gustin, MD;  Location: [facility];  Service: Urology;  Laterality: N/A;  . TRANSURETHRAL RESECTION OF PROSTATE N/A 11/04/2015   Procedure: TRANSURETHRAL RESECTION OF THE PROSTATE (TURP); REMOVAL OF UROLIFT IMPLANTS X THREE;  Surgeon: Cleon Gustin, MD;  Location: [facility];  Service: Urology;  Laterality: N/A;  . UPPER GASTROINTESTINAL ENDOSCOPY  12/2013   Dr Hilarie Fredrickson, gastritis    Prior to Admission medications   Medication  Sig Start Date End Date Taking? Authorizing Provider  albuterol (PROVENTIL HFA;VENTOLIN HFA) 108 (90 Base) MCG/ACT inhaler Inhale 2 puffs into the lungs every 6 (six) hours as needed for wheezing or shortness of breath. 02/26/16  Yes Chipper Herb, MD  arformoterol (BROVANA) 15 MCG/2ML NEBU Take 2 mLs (15 mcg total) by nebulization 2 (two) times daily as needed. Patient taking differently: Take 15 mcg by nebulization 2 (two) times daily as needed (shortness of breath).  03/01/16  Yes Chipper Herb, MD  bisacodyl (DULCOLAX) 10 MG suppository Place 10 mg rectally as needed for moderate constipation.   Yes Historical Provider, MD  bromocriptine (PARLODEL) 2.5 MG tablet Take 2.5 mg by mouth 2 (  two) times daily.   Yes Historical Provider, MD  calcium carbonate (OSCAL) 1500 (600 Ca) MG TABS tablet Take 600 mg of elemental calcium by mouth 2 (two) times daily with a meal.    Yes Historical Provider, MD  cholecalciferol (VITAMIN D) 1000 UNITS tablet Take 1,000 Units by mouth daily.    Yes Historical Provider, MD  diltiazem (DILACOR XR) 120 MG 24 hr capsule Take 120 mg by mouth daily.   Yes Historical Provider, MD  ferrous sulfate 325 (65 FE) MG tablet Take 325 mg by mouth daily with breakfast.    Yes Historical Provider, MD  guaiFENesin (MUCINEX) 600 MG 12 hr tablet Take 600 mg by mouth 2 (two) times daily.    Yes Historical Provider, MD  levothyroxine (SYNTHROID, LEVOTHROID) 50 MCG tablet TAKE 1 TABLET IN MORNING Patient taking differently: TAKE 50mg  TABLET IN MORNING 02/25/16  Yes Renato Shin, MD  magnesium oxide (MAG-OX) 400 (241.3 Mg) MG tablet Take 400 mg by mouth every other day.   Yes Historical Provider, MD  montelukast (SINGULAIR) 10 MG tablet Take 10 mg by mouth at bedtime as needed (for allergies).    Yes Historical Provider, MD  naphazoline-pheniramine (NAPHCON-A) 0.025-0.3 % ophthalmic solution Place 1 drop into both eyes 2 (two) times daily as needed for irritation or allergies.    Yes  Historical Provider, MD  nitroGLYCERIN (NITROSTAT) 0.4 MG SL tablet Place 0.4 mg under the tongue every 5 (five) minutes as needed for chest pain.    Yes Historical Provider, MD  pantoprazole (PROTONIX) 40 MG tablet Take 1 tablet (40 mg total) by mouth 2 (two) times daily before a meal. 12/23/15  Yes Jerene Bears, MD  polyethylene glycol (MIRALAX / GLYCOLAX) packet Take 17 g by mouth daily. Patient taking differently: Take 17 g by mouth daily as needed for moderate constipation.  12/23/15  Yes Jerene Bears, MD  sucralfate (CARAFATE) 1 g tablet Take 1 tablet (1 g total) by mouth 4 (four) times daily as needed (for GI upset). 12/23/15  Yes Jerene Bears, MD  SYMBICORT 160-4.5 MCG/ACT inhaler Inhale 2 puffs into the lungs 2 (two) times daily. 02/02/16  Yes Chipper Herb, MD  vitamin B-12 (CYANOCOBALAMIN) 1000 MCG tablet Take 1,000 mcg by mouth daily.   Yes Historical Provider, MD  vitamin E (VITAMIN E) 400 UNIT capsule Take 400 Units by mouth daily.   Yes Historical Provider, MD  doxycycline (VIBRA-TABS) 100 MG tablet Take 1 tablet (100 mg total) by mouth 2 (two) times daily. 1 po bid Patient not taking: Reported on 03/23/2016 02/01/16   Chipper Herb, MD  levofloxacin (LEVAQUIN) 500 MG tablet Take 1 tablet (500 mg total) by mouth daily. Patient not taking: Reported on 03/23/2016 02/26/16   Chipper Herb, MD    No current facility-administered medications for this encounter.    Current Outpatient Prescriptions  Medication Sig Dispense Refill  . albuterol (PROVENTIL HFA;VENTOLIN HFA) 108 (90 Base) MCG/ACT inhaler Inhale 2 puffs into the lungs every 6 (six) hours as needed for wheezing or shortness of breath. 18 g 11  . arformoterol (BROVANA) 15 MCG/2ML NEBU Take 2 mLs (15 mcg total) by nebulization 2 (two) times daily as needed. (Patient taking differently: Take 15 mcg by nebulization 2 (two) times daily as needed (shortness of breath). ) 120 mL 3  . bisacodyl (DULCOLAX) 10 MG suppository Place 10 mg  rectally as needed for moderate constipation.    . bromocriptine (PARLODEL) 2.5 MG tablet Take  2.5 mg by mouth 2 (two) times daily.    . calcium carbonate (OSCAL) 1500 (600 Ca) MG TABS tablet Take 600 mg of elemental calcium by mouth 2 (two) times daily with a meal.     . cholecalciferol (VITAMIN D) 1000 UNITS tablet Take 1,000 Units by mouth daily.     Marland Kitchen diltiazem (DILACOR XR) 120 MG 24 hr capsule Take 120 mg by mouth daily.    . ferrous sulfate 325 (65 FE) MG tablet Take 325 mg by mouth daily with breakfast.     . guaiFENesin (MUCINEX) 600 MG 12 hr tablet Take 600 mg by mouth 2 (two) times daily.     Marland Kitchen levothyroxine (SYNTHROID, LEVOTHROID) 50 MCG tablet TAKE 1 TABLET IN MORNING (Patient taking differently: TAKE 50mg  TABLET IN MORNING) 30 tablet 0  . magnesium oxide (MAG-OX) 400 (241.3 Mg) MG tablet Take 400 mg by mouth every other day.    . montelukast (SINGULAIR) 10 MG tablet Take 10 mg by mouth at bedtime as needed (for allergies).     . naphazoline-pheniramine (NAPHCON-A) 0.025-0.3 % ophthalmic solution Place 1 drop into both eyes 2 (two) times daily as needed for irritation or allergies.     . nitroGLYCERIN (NITROSTAT) 0.4 MG SL tablet Place 0.4 mg under the tongue every 5 (five) minutes as needed for chest pain.     . pantoprazole (PROTONIX) 40 MG tablet Take 1 tablet (40 mg total) by mouth 2 (two) times daily before a meal. 60 tablet 11  . polyethylene glycol (MIRALAX / GLYCOLAX) packet Take 17 g by mouth daily. (Patient taking differently: Take 17 g by mouth daily as needed for moderate constipation. ) 30 each 3  . sucralfate (CARAFATE) 1 g tablet Take 1 tablet (1 g total) by mouth 4 (four) times daily as needed (for GI upset). 120 tablet 3  . SYMBICORT 160-4.5 MCG/ACT inhaler Inhale 2 puffs into the lungs 2 (two) times daily. 10.2 g 5  . vitamin B-12 (CYANOCOBALAMIN) 1000 MCG tablet Take 1,000 mcg by mouth daily.    . vitamin E (VITAMIN E) 400 UNIT capsule Take 400 Units by mouth daily.     Marland Kitchen doxycycline (VIBRA-TABS) 100 MG tablet Take 1 tablet (100 mg total) by mouth 2 (two) times daily. 1 po bid (Patient not taking: Reported on 03/23/2016) 60 tablet 1  . levofloxacin (LEVAQUIN) 500 MG tablet Take 1 tablet (500 mg total) by mouth daily. (Patient not taking: Reported on 03/23/2016) 7 tablet 0    Allergies as of 03/23/2016 - Review Complete 03/23/2016  Allergen Reaction Noted  . Betadine [povidone iodine] Other (See Comments) 05/28/2015  . Lortab [hydrocodone-acetaminophen] Nausea And Vomiting 07/18/2011  . Penicillins Other (See Comments)   . Zetia [ezetimibe] Other (See Comments) 06/23/2010  . Zocor [simvastatin] Nausea Only and Other (See Comments) 06/23/2010    Family History  Problem Relation Age of Onset  . Heart disease Sister   . Heart failure Father   . Colon cancer Neg Hx   . Esophageal cancer Neg Hx   . Rectal cancer Neg Hx   . Stomach cancer Neg Hx     Social History   Social History  . Marital status: Married    Spouse name: N/A  . Number of children: 2  . Years of education: hs   Occupational History  . retired    Social History Main Topics  . Smoking status: Never Smoker  . Smokeless tobacco: Never Used  . Alcohol use No  .  Drug use: No  . Sexual activity: No   Other Topics Concern  . Not on file   Social History Narrative   Patient drinks caffeine a few times a week.   Patient is right handed.    Review of Systems: All systems reviewed and negative except where noted in HPI.  Physical Exam: Vital signs in last 24 hours: Temp:  [97.7 F (36.5 C)] 97.7 F (36.5 C) (01/24 1215) Pulse Rate:  [56-75] 56 (01/24 1539) Resp:  [16-18] 16 (01/24 1539) BP: (123)/(69-87) 123/69 (01/24 1539) SpO2:  [98 %-99 %] 99 % (01/24 1539) Weight:  [151 lb (68.5 kg)] 151 lb (68.5 kg) (01/24 1217)   General:   Alert,  well-developed, well-nourished, pleasant and cooperative in NAD Head:  Normocephalic and atraumatic. Eyes:  Sclera clear, no icterus.    Conjunctiva pink. Ears:  Normal auditory acuity. Nose:  No deformity, discharge,  or lesions. Mouth:  No deformity or lesions.   Neck:  Supple; no masses or thyromegaly. Lungs:  Clear throughout to auscultation.   No wheezes, crackles, or rhonchi. Heart:  Regular rate and rhythm, no edema  Abdomen:  Soft,nontender, BS active,nonpalp mass or hsm.   Rectal:  Deferred  Msk:  Symmetrical without gross deformities. . Pulses:  Normal pulses noted. Extremities:  Without clubbing or edema. Neurologic:  Alert and  oriented x4;  grossly normal neurologically. Skin:  Intact without significant lesions or rashes.. Psych:  Alert and cooperative. Normal mood and affect.  Tye Savoy, NP-C @  03/23/2016, 3:41 PM Pager number [phone]  GI ATTENDING  History, prior endoscopy reports reviewed. Patient personally seen and examined. Several family members in room. Agree with consultation note as outlined above. Not sure what happened with the patient earlier today. Atypical for food impaction by history. No difficulty with secretions. Drink liquids a few hours ago without issues. Drink 8 ounces of water with me witnessing without difficulty. Has no complaints. He does however have obvious intermittent problems with dysphagia. Tight peptic stricture based on last endoscopy report. He will require repeat upper endoscopy with esophageal dilation with Dr. Hilarie Fredrickson. We have made him aware of the patient and the recommendation. In the interim, I stressed he take his PPI twice daily without fail. I feel less strongly about Carafate. Until the elective endoscopy, he should stay on no more than full liquids. Okay to go home  [employer]. Geri Seminole., M.D. [facility] Division of Gastroenterology

## 2016-03-23 NOTE — Telephone Encounter (Signed)
Son and patient report that after eating cereal this am he developed considerable indigestion followed by hiccups then vomiting. He has attempted to drink water and he can't swallow it.  His son is advised that he needs eval in ED for possible food impaction. They verbalized understanding.

## 2016-03-23 NOTE — ED Triage Notes (Signed)
Per family member, states he was diagnosed with hiatal hernia a year ago-states unable to hold down fluids and food-states he was feeling fine last night-GI MD told him to come to ED for possible blockage

## 2016-03-24 ENCOUNTER — Telehealth: Payer: Self-pay | Admitting: Internal Medicine

## 2016-03-24 ENCOUNTER — Other Ambulatory Visit: Payer: Self-pay | Admitting: Internal Medicine

## 2016-03-24 DIAGNOSIS — K222 Esophageal obstruction: Secondary | ICD-10-CM | POA: Insufficient documentation

## 2016-03-24 DIAGNOSIS — R1310 Dysphagia, unspecified: Secondary | ICD-10-CM

## 2016-03-24 DIAGNOSIS — R1110 Vomiting, unspecified: Secondary | ICD-10-CM | POA: Insufficient documentation

## 2016-03-24 NOTE — Telephone Encounter (Signed)
Patient has been scheduled at [facility] on tomorrow, 03/25/16 @ 2 pm for endoscopy with dilation. Unfortunately, no MAC available so will have to be moderate sedation. I have spoken to patient to advise that he will need to go to [facility] admissions tomorrow at 12:30 pm for his 2 pm test. I have advised only clear liquids after midnight tonight and up until 4 hours prior to test (10 am). I have also advised ok for medications up until 10. I explained that he would need a care partner 23 or older to be present for his entire procedure as well as to drive him home since he will have sedation. Patient verbalizes understanding of all of the above.

## 2016-03-24 NOTE — Telephone Encounter (Signed)
===  View-only below this line===  ----- Message ----- From: Jerene Bears, MD Sent: 03/24/2016  12:03 PM To: Larina Bras, CMA Subject: Urgent EGD                                     Patient recently in the ER. Called last night with ongoing issues with dysphagia when he tried to eat crackers. This did slowly pass but he's been unable to eat anything solid since. Needs expedited endoscopy. I have discussed this with Dr. Henrene Pastor who can perform the procedure tomorrow at [facility] as an outpatient. Preferably with MAC but not required Please contact patient to arrange outpatient EGD with dilation tomorrow at [facility] with Dr. Henrene Pastor Patient will need to be informed of instructions and timing

## 2016-03-25 ENCOUNTER — Encounter (HOSPITAL_COMMUNITY)
Admission: RE | Disposition: A | Discharge status: Home / Self Care | Payer: Self-pay | Source: Ambulatory Visit | Attending: Internal Medicine

## 2016-03-25 ENCOUNTER — Encounter (HOSPITAL_COMMUNITY): Payer: Self-pay

## 2016-03-25 ENCOUNTER — Ambulatory Visit (HOSPITAL_COMMUNITY)
Admission: RE | Admit: 2016-03-25 | Discharge: 2016-03-25 | Disposition: A | Discharge status: Home / Self Care | Payer: Medicare Other | Source: Ambulatory Visit | Attending: Internal Medicine | Admitting: Internal Medicine

## 2016-03-25 DIAGNOSIS — Z955 Presence of coronary angioplasty implant and graft: Secondary | ICD-10-CM | POA: Diagnosis not present

## 2016-03-25 DIAGNOSIS — D649 Anemia, unspecified: Secondary | ICD-10-CM | POA: Diagnosis not present

## 2016-03-25 DIAGNOSIS — D72829 Elevated white blood cell count, unspecified: Secondary | ICD-10-CM | POA: Insufficient documentation

## 2016-03-25 DIAGNOSIS — K222 Esophageal obstruction: Secondary | ICD-10-CM | POA: Diagnosis not present

## 2016-03-25 DIAGNOSIS — K219 Gastro-esophageal reflux disease without esophagitis: Secondary | ICD-10-CM | POA: Insufficient documentation

## 2016-03-25 DIAGNOSIS — J439 Emphysema, unspecified: Secondary | ICD-10-CM | POA: Insufficient documentation

## 2016-03-25 DIAGNOSIS — Z79899 Other long term (current) drug therapy: Secondary | ICD-10-CM | POA: Diagnosis not present

## 2016-03-25 DIAGNOSIS — Z85828 Personal history of other malignant neoplasm of skin: Secondary | ICD-10-CM | POA: Insufficient documentation

## 2016-03-25 DIAGNOSIS — I2510 Atherosclerotic heart disease of native coronary artery without angina pectoris: Secondary | ICD-10-CM | POA: Insufficient documentation

## 2016-03-25 DIAGNOSIS — Z7951 Long term (current) use of inhaled steroids: Secondary | ICD-10-CM | POA: Diagnosis not present

## 2016-03-25 DIAGNOSIS — R1310 Dysphagia, unspecified: Secondary | ICD-10-CM | POA: Diagnosis not present

## 2016-03-25 DIAGNOSIS — E039 Hypothyroidism, unspecified: Secondary | ICD-10-CM | POA: Insufficient documentation

## 2016-03-25 DIAGNOSIS — K210 Gastro-esophageal reflux disease with esophagitis: Secondary | ICD-10-CM | POA: Diagnosis not present

## 2016-03-25 DIAGNOSIS — Z96643 Presence of artificial hip joint, bilateral: Secondary | ICD-10-CM | POA: Insufficient documentation

## 2016-03-25 HISTORY — PX: SAVORY DILATION: SHX5439

## 2016-03-25 HISTORY — PX: ESOPHAGOGASTRODUODENOSCOPY: SHX5428

## 2016-03-25 SURGERY — EGD (ESOPHAGOGASTRODUODENOSCOPY)
Anesthesia: Moderate Sedation

## 2016-03-25 MED ORDER — MIDAZOLAM HCL 10 MG/2ML IJ SOLN
INTRAMUSCULAR | Status: DC | PRN
  Administered 2016-03-25 (×3): 2 mg via INTRAVENOUS

## 2016-03-25 MED ORDER — SODIUM CHLORIDE 0.9 % IV SOLN: INTRAVENOUS | Status: DC

## 2016-03-25 MED ORDER — MIDAZOLAM HCL 5 MG/ML IJ SOLN
INTRAMUSCULAR | Status: AC
Start: 2016-03-25 — End: 2016-03-25
  Filled 2016-03-25: qty 2

## 2016-03-25 MED ORDER — LACTATED RINGERS IV SOLN
INTRAVENOUS | Status: DC
  Administered 2016-03-25: 1000 mL via INTRAVENOUS

## 2016-03-25 MED ORDER — FENTANYL CITRATE (PF) 100 MCG/2ML IJ SOLN
INTRAMUSCULAR | Status: AC
  Filled 2016-03-25: qty 2

## 2016-03-25 MED ORDER — FENTANYL CITRATE (PF) 100 MCG/2ML IJ SOLN
INTRAMUSCULAR | Status: DC | PRN
  Administered 2016-03-25 (×2): 25 ug via INTRAVENOUS

## 2016-03-25 NOTE — Interval H&P Note (Signed)
History and Physical Interval Note:  03/25/2016 2:01 PM  [name]  has presented today for surgery, with the diagnosis of dysphagia  The various methods of treatment have been discussed with the patient and family. After consideration of risks, benefits and other options for treatment, the patient has consented to  Procedure(s): ESOPHAGOGASTRODUODENOSCOPY (EGD) (N/A) SAVORY DILATION (N/A) as a surgical intervention .  The patient's history has been reviewed, patient examined, no change in status, stable for surgery.  I have reviewed the patient's chart and labs.  Questions were answered to the patient's satisfaction.     [name]

## 2016-03-25 NOTE — Op Note (Signed)
[facility] Patient Name: [name] Procedure Date: 03/25/2016 MRN: [id number] Attending MD: Docia Chuck. Henrene Pastor , MD Date of Birth: 09/05/1932 CSN: [id number] Age: 83 Admit Type: Outpatient Procedure:                Upper GI endoscopy, with balloon dilation of the                            esophagus Indications:              Dysphagia Providers:                Docia Chuck. Henrene Pastor, MD, Elmer Ramp. Tilden Dome, RN, Cletis Athens, Technician Referring MD:              Medicines:                Monitored Anesthesia Care Complications:            No immediate complications. Estimated Blood Loss:     Estimated blood loss: none. Procedure:                Pre-Anesthesia Assessment:                           - Prior to the procedure, a History and Physical                            was performed, and patient medications and                            allergies were reviewed. The patient's tolerance of                            previous anesthesia was also reviewed. The risks                            and benefits of the procedure and the sedation                            options and risks were discussed with the patient.                            All questions were answered, and informed consent                            was obtained. Prior Anticoagulants: The patient has                            taken no previous anticoagulant or antiplatelet                            agents. ASA Grade Assessment: III - A patient with  severe systemic disease. After reviewing the risks                            and benefits, the patient was deemed in                            satisfactory condition to undergo the procedure.                           After obtaining informed consent, the endoscope was                            passed under direct vision. Throughout the                            procedure, the patient's blood pressure, pulse,  and                            oxygen saturations were monitored continuously. The                            [id number]) scope was introduced through the                            mouth, and advanced to the second part of duodenum.                            The upper GI endoscopy was accomplished without                            difficulty. The patient tolerated the procedure                            well. Findings:      One moderate (circumferential scarring or stenosis; an endoscope may       pass, but snug) benign-appearing, intrinsic stenosis was found 38 cm       from the incisors. Mild esophagitis. This measured 1 cm (inner       diameter). A TTS dilator was passed through the scope. Dilation with a       12-13.5-15 mm balloon dilator was performed to 15 mm. The dilation site       was examined and showed moderate improvement in luminal narrowing. There       was mild local trauma.      The exam of the esophagus was otherwise normal.      The stomach was normal, except for sliding hiatal hernia..      The examined duodenum was normal.      The cardia and gastric fundus were normal on retroflexion. Impression:               - Esophageal stenoses. Dilated.                           - Normal stomach.                           -  Normal examined duodenum.                           - No specimens collected. Moderate Sedation:      Moderate (conscious) sedation was administered by the endoscopy nurse       and supervised by the endoscopist. The following parameters were       monitored: oxygen saturation, heart rate, blood pressure, and response       to care. Total physician intraservice time was 15 minutes. Recommendation:           1. Nothing by mouth for 2 hours, clear liquids for                            2 hours, then soft foods until a.m. Resume previous                            diet thereafter. Be careful as you should she food                            very  carefully and take small bites                           2. Continue omeprazole or pantoprazole twice daily                            indefinitely                           3. Recommend follow-up endoscopy with dilation with                            Dr. Hilarie Fredrickson in about 4 weeks. Hopefully the                            esophageal diameter can improve over time with more                            frequent serial dilations. Procedure Code(s):        --- Professional ---                           [phone], Esophagogastroduodenoscopy, flexible,                            transoral; with transendoscopic balloon dilation of                            esophagus (less than 30 mm diameter)                           99152, Moderate sedation services provided by the                            same physician or other qualified health care  professional performing the diagnostic or                            therapeutic service that the sedation supports,                            requiring the presence of an independent trained                            observer to assist in the monitoring of the                            patient's level of consciousness and physiological                            status; initial 15 minutes of intraservice time,                            patient age 16 years or older Diagnosis Code(s):        --- Professional ---                           K22.2, Esophageal obstruction                           R13.10, Dysphagia, unspecified CPT copyright 2016 American Medical Association. All rights reserved. The codes documented in this report are preliminary and upon coder review may  be revised to meet current compliance requirements. Docia Chuck. Henrene Pastor, MD 03/25/2016 2:38:57 PM This report has been signed electronically. Number of Addenda: 0

## 2016-03-25 NOTE — H&P (View-Only) (Signed)
Referring Provider: ED - Corinna Capra, P.A Primary Care Physician:  Redge Gainer, MD Primary Gastroenterologist:   Zenovia Jarred,  MD  Reason for Consultation:  dysphagia  / ? Food impaction  ASSESSMENT AND PLAN:   1. 83 yo male with dysphagia and known esophageal stricture. There was concern for food impaction at home this am but patient is able to tolerate secretions as well as water. -Patient needs EGD with dilation but this should be okay to have done as outpatient. We discussed eating precautions to avoid impaction. Will contact his primary GI, Dr. Hilarie Fredrickson about scheduling the procedure as outpatient -increase PPI to BID for now.    2. Epigastric pain. Pain resolved after vomiting this am. This is chronic intermittent and of has been previously evaluated by Dr. Hilarie Fredrickson.  LFTs are normal.     3. Leukocytosis. Etiology? He was recently treated for PNA. Lungs sound ok. Denies fevers. No urinary sx.  -Can repeat CBC at time of outpatient EGD     HPI: [name] is a 83 y.o. male known to Dr. Hilarie Fredrickson for a history of GERD with esophogeal ulceration, Barrett's esophagus and hiatal hernia s/p repair approximately 6 years ago. He has chronic, intermittent epigastric discomfort and solid food dysphagia. Patient's last EGD was August of this year. There was a moderate stenosis at 39cm from incisors. Scope traversed the area though there was a minor tear where scope went through so dilation wasn't done due to risk of bleeding. He is on a daily PPI as well as sucralfate BID.   This am patient felt like am Carafate didn't go down well. He was recently treated for PNA. This am he coughed up a lot of thick phlegm. While eating breakfast he developed severe non-radiating epigastric pain and subsequently vomited a small amount of undigested eggs. Pain resolved after vomiting. Patient has a history of intermittent epigastric pain which he calls indigestion. Some SOB but only associated with the severe  pain. No chest pain. No acute EKG changes. Patient has intermittent solid food dysphagia. He is taking a daily PPI   Past Medical History:  Diagnosis Date  . Abnormality of gait 06/07/2013  . Anemia   . Anginal pain (H[facility])    hx of   . Arthritis   . Asthma   . Atrial fibrillation ([place])   . Blood transfusion june 2011, 5 units given   july 2011 some units given  . CAD (coronary artery disease) 03/2006   3.0 x 20 mm TAXUS Perseus DES to the LAD; 01/2007  L main 30%, oLAD 50%, pLAD stent ok, CFX 80%, OM 60%, pRCA 60%, mRCA 70%, oPDA 90%; med rx   . Cancer ([place])    skin cancer   . Cataract    bilateral removal of cateracts  . Complication of anesthesia     no issues,but pt prefers spinal due to Pulmonary problems  . COPD (chronic obstructive pulmonary disease) ([place])    oxygen  on standby in home.  . Depression   . Diverticulosis   . Dizziness    occasional  . Emphysema   . Enlarged prostate    elevated psa recently  . Enlarged prostate with urinary retention   . Esophageal stenosis   . GERD (gastroesophageal reflux disease)   . Hiatal hernia   . Hypothyroidism   . Mass, scrotum    L scrotum - Sees Dr. Jeffie Pollock  . Neuropathy ([place])   . Pituitary macroadenoma ([place])   .  Pneumonia    No recent.  Marland Kitchen Restless legs syndrome (RLS)   . Transfusion history    history 4 yrs ago.   Marland Kitchen UGIB (upper gastrointestinal bleed) 03/2013   EGD w/ large ulcer at GE junction  . UGIB (upper gastrointestinal bleed) 08/09/2009   At GE junction, rx w/ endoclips  . UGIB (upper gastrointestinal bleed) 09/2009   just above GE junction, rx  w/ 2 endoclips    Past Surgical History:  Procedure Laterality Date  . ABDOMINAL HERNIA REPAIR   2008  . CARDIAC CATHETERIZATION  01/2007   L main 30%, oLAD 50%,  pLAD stent ok, CFX 80%, OM 60%, pRCA 60%, mRCA 70%, oPDA 90%; med rx  . cataract extraction both eyes    . COLONOSCOPY    . CORONARY STENT PLACEMENT  03/2006   3.0 x 20 mm TAXUS Perseus DES to the LAD    . CYSTOSCOPY N/A 06/10/2015   Procedure: CYSTOSCOPY FULGRATION OF BLEEDING,  electovapor resection;  Surgeon: Irine Seal, MD;  Location: [facility];  Service: Urology;  Laterality: N/A;  . CYSTOSCOPY WITH INSERTION OF UROLIFT N/A 06/01/2015   Procedure: CYSTOSCOPY WITH INSERTION OF UROLIFT x4;  Surgeon: Irine Seal, MD;  Location: [facility];  Service: Urology;  Laterality: N/A;  . ESOPHAGOGASTRODUODENOSCOPY N/A 04/20/2013   Procedure: ESOPHAGOGASTRODUODENOSCOPY (EGD);  Surgeon: Inda Castle, MD;  Location: [facility];  Service: Endoscopy;  Laterality: N/A;  . ESOPHAGOGASTRODUODENOSCOPY (EGD) WITH PROPOFOL N/A 10/13/2015   Procedure: ESOPHAGOGASTRODUODENOSCOPY (EGD) WITH PROPOFOL;  Surgeon: Jerene Bears, MD;  Location: [facility];  Service: Gastroenterology;  Laterality: N/A;  . FOOT SURGERY  1994 left, 2002 right foot   bilateral foot reconstruciton  . FOOT SURGERY     reconstruction of both feet- no retained hardware.  Marland Kitchen HIATAL HERNIA REPAIR  01-04-2008  . PROSTATE SURGERY     x 2  . TOTAL HIP ARTHROPLASTY  07/21/2011   Procedure: TOTAL HIP ARTHROPLASTY ANTERIOR APPROACH;  Surgeon: Mauri Pole, MD;  Location: [facility];  Service: Orthopedics;  Laterality: Left;  . TOTAL HIP ARTHROPLASTY Right 09/07/2012   Procedure: RIGHT TOTAL HIP ARTHROPLASTY ANTERIOR APPROACH;  Surgeon: Mcarthur Rossetti, MD;  Location: [facility];  Service: Orthopedics;  Laterality: Right;  . TRANSURETHRAL RESECTION OF BLADDER NECK N/A 11/04/2015   Procedure: RESECTION OF BLADDER NECK;  Surgeon: Cleon Gustin, MD;  Location: [facility];  Service: Urology;  Laterality: N/A;  . TRANSURETHRAL RESECTION OF PROSTATE N/A 11/04/2015   Procedure: TRANSURETHRAL RESECTION OF THE PROSTATE (TURP); REMOVAL OF UROLIFT IMPLANTS X THREE;  Surgeon: Cleon Gustin, MD;  Location: [facility];  Service: Urology;  Laterality: N/A;  . UPPER GASTROINTESTINAL ENDOSCOPY  12/2013   Dr Hilarie Fredrickson, gastritis    Prior to Admission medications   Medication  Sig Start Date End Date Taking? Authorizing Provider  albuterol (PROVENTIL HFA;VENTOLIN HFA) 108 (90 Base) MCG/ACT inhaler Inhale 2 puffs into the lungs every 6 (six) hours as needed for wheezing or shortness of breath. 02/26/16  Yes Chipper Herb, MD  arformoterol (BROVANA) 15 MCG/2ML NEBU Take 2 mLs (15 mcg total) by nebulization 2 (two) times daily as needed. Patient taking differently: Take 15 mcg by nebulization 2 (two) times daily as needed (shortness of breath).  03/01/16  Yes Chipper Herb, MD  bisacodyl (DULCOLAX) 10 MG suppository Place 10 mg rectally as needed for moderate constipation.   Yes Historical Provider, MD  bromocriptine (PARLODEL) 2.5 MG tablet Take 2.5 mg by mouth 2 (  two) times daily.   Yes Historical Provider, MD  calcium carbonate (OSCAL) 1500 (600 Ca) MG TABS tablet Take 600 mg of elemental calcium by mouth 2 (two) times daily with a meal.    Yes Historical Provider, MD  cholecalciferol (VITAMIN D) 1000 UNITS tablet Take 1,000 Units by mouth daily.    Yes Historical Provider, MD  diltiazem (DILACOR XR) 120 MG 24 hr capsule Take 120 mg by mouth daily.   Yes Historical Provider, MD  ferrous sulfate 325 (65 FE) MG tablet Take 325 mg by mouth daily with breakfast.    Yes Historical Provider, MD  guaiFENesin (MUCINEX) 600 MG 12 hr tablet Take 600 mg by mouth 2 (two) times daily.    Yes Historical Provider, MD  levothyroxine (SYNTHROID, LEVOTHROID) 50 MCG tablet TAKE 1 TABLET IN MORNING Patient taking differently: TAKE 50mg  TABLET IN MORNING 02/25/16  Yes Renato Shin, MD  magnesium oxide (MAG-OX) 400 (241.3 Mg) MG tablet Take 400 mg by mouth every other day.   Yes Historical Provider, MD  montelukast (SINGULAIR) 10 MG tablet Take 10 mg by mouth at bedtime as needed (for allergies).    Yes Historical Provider, MD  naphazoline-pheniramine (NAPHCON-A) 0.025-0.3 % ophthalmic solution Place 1 drop into both eyes 2 (two) times daily as needed for irritation or allergies.    Yes  Historical Provider, MD  nitroGLYCERIN (NITROSTAT) 0.4 MG SL tablet Place 0.4 mg under the tongue every 5 (five) minutes as needed for chest pain.    Yes Historical Provider, MD  pantoprazole (PROTONIX) 40 MG tablet Take 1 tablet (40 mg total) by mouth 2 (two) times daily before a meal. 12/23/15  Yes Jerene Bears, MD  polyethylene glycol (MIRALAX / GLYCOLAX) packet Take 17 g by mouth daily. Patient taking differently: Take 17 g by mouth daily as needed for moderate constipation.  12/23/15  Yes Jerene Bears, MD  sucralfate (CARAFATE) 1 g tablet Take 1 tablet (1 g total) by mouth 4 (four) times daily as needed (for GI upset). 12/23/15  Yes Jerene Bears, MD  SYMBICORT 160-4.5 MCG/ACT inhaler Inhale 2 puffs into the lungs 2 (two) times daily. 02/02/16  Yes Chipper Herb, MD  vitamin B-12 (CYANOCOBALAMIN) 1000 MCG tablet Take 1,000 mcg by mouth daily.   Yes Historical Provider, MD  vitamin E (VITAMIN E) 400 UNIT capsule Take 400 Units by mouth daily.   Yes Historical Provider, MD  doxycycline (VIBRA-TABS) 100 MG tablet Take 1 tablet (100 mg total) by mouth 2 (two) times daily. 1 po bid Patient not taking: Reported on 03/23/2016 02/01/16   Chipper Herb, MD  levofloxacin (LEVAQUIN) 500 MG tablet Take 1 tablet (500 mg total) by mouth daily. Patient not taking: Reported on 03/23/2016 02/26/16   Chipper Herb, MD    No current facility-administered medications for this encounter.    Current Outpatient Prescriptions  Medication Sig Dispense Refill  . albuterol (PROVENTIL HFA;VENTOLIN HFA) 108 (90 Base) MCG/ACT inhaler Inhale 2 puffs into the lungs every 6 (six) hours as needed for wheezing or shortness of breath. 18 g 11  . arformoterol (BROVANA) 15 MCG/2ML NEBU Take 2 mLs (15 mcg total) by nebulization 2 (two) times daily as needed. (Patient taking differently: Take 15 mcg by nebulization 2 (two) times daily as needed (shortness of breath). ) 120 mL 3  . bisacodyl (DULCOLAX) 10 MG suppository Place 10 mg  rectally as needed for moderate constipation.    . bromocriptine (PARLODEL) 2.5 MG tablet Take  2.5 mg by mouth 2 (two) times daily.    . calcium carbonate (OSCAL) 1500 (600 Ca) MG TABS tablet Take 600 mg of elemental calcium by mouth 2 (two) times daily with a meal.     . cholecalciferol (VITAMIN D) 1000 UNITS tablet Take 1,000 Units by mouth daily.     Marland Kitchen diltiazem (DILACOR XR) 120 MG 24 hr capsule Take 120 mg by mouth daily.    . ferrous sulfate 325 (65 FE) MG tablet Take 325 mg by mouth daily with breakfast.     . guaiFENesin (MUCINEX) 600 MG 12 hr tablet Take 600 mg by mouth 2 (two) times daily.     Marland Kitchen levothyroxine (SYNTHROID, LEVOTHROID) 50 MCG tablet TAKE 1 TABLET IN MORNING (Patient taking differently: TAKE 50mg  TABLET IN MORNING) 30 tablet 0  . magnesium oxide (MAG-OX) 400 (241.3 Mg) MG tablet Take 400 mg by mouth every other day.    . montelukast (SINGULAIR) 10 MG tablet Take 10 mg by mouth at bedtime as needed (for allergies).     . naphazoline-pheniramine (NAPHCON-A) 0.025-0.3 % ophthalmic solution Place 1 drop into both eyes 2 (two) times daily as needed for irritation or allergies.     . nitroGLYCERIN (NITROSTAT) 0.4 MG SL tablet Place 0.4 mg under the tongue every 5 (five) minutes as needed for chest pain.     . pantoprazole (PROTONIX) 40 MG tablet Take 1 tablet (40 mg total) by mouth 2 (two) times daily before a meal. 60 tablet 11  . polyethylene glycol (MIRALAX / GLYCOLAX) packet Take 17 g by mouth daily. (Patient taking differently: Take 17 g by mouth daily as needed for moderate constipation. ) 30 each 3  . sucralfate (CARAFATE) 1 g tablet Take 1 tablet (1 g total) by mouth 4 (four) times daily as needed (for GI upset). 120 tablet 3  . SYMBICORT 160-4.5 MCG/ACT inhaler Inhale 2 puffs into the lungs 2 (two) times daily. 10.2 g 5  . vitamin B-12 (CYANOCOBALAMIN) 1000 MCG tablet Take 1,000 mcg by mouth daily.    . vitamin E (VITAMIN E) 400 UNIT capsule Take 400 Units by mouth daily.     Marland Kitchen doxycycline (VIBRA-TABS) 100 MG tablet Take 1 tablet (100 mg total) by mouth 2 (two) times daily. 1 po bid (Patient not taking: Reported on 03/23/2016) 60 tablet 1  . levofloxacin (LEVAQUIN) 500 MG tablet Take 1 tablet (500 mg total) by mouth daily. (Patient not taking: Reported on 03/23/2016) 7 tablet 0    Allergies as of 03/23/2016 - Review Complete 03/23/2016  Allergen Reaction Noted  . Betadine [povidone iodine] Other (See Comments) 05/28/2015  . Lortab [hydrocodone-acetaminophen] Nausea And Vomiting 07/18/2011  . Penicillins Other (See Comments)   . Zetia [ezetimibe] Other (See Comments) 06/23/2010  . Zocor [simvastatin] Nausea Only and Other (See Comments) 06/23/2010    Family History  Problem Relation Age of Onset  . Heart disease Sister   . Heart failure Father   . Colon cancer Neg Hx   . Esophageal cancer Neg Hx   . Rectal cancer Neg Hx   . Stomach cancer Neg Hx     Social History   Social History  . Marital status: Married    Spouse name: N/A  . Number of children: 2  . Years of education: hs   Occupational History  . retired    Social History Main Topics  . Smoking status: Never Smoker  . Smokeless tobacco: Never Used  . Alcohol use No  .  Drug use: No  . Sexual activity: No   Other Topics Concern  . Not on file   Social History Narrative   Patient drinks caffeine a few times a week.   Patient is right handed.    Review of Systems: All systems reviewed and negative except where noted in HPI.  Physical Exam: Vital signs in last 24 hours: Temp:  [97.7 F (36.5 C)] 97.7 F (36.5 C) (01/24 1215) Pulse Rate:  [56-75] 56 (01/24 1539) Resp:  [16-18] 16 (01/24 1539) BP: (123)/(69-87) 123/69 (01/24 1539) SpO2:  [98 %-99 %] 99 % (01/24 1539) Weight:  [151 lb (68.5 kg)] 151 lb (68.5 kg) (01/24 1217)   General:   Alert,  well-developed, well-nourished, pleasant and cooperative in NAD Head:  Normocephalic and atraumatic. Eyes:  Sclera clear, no icterus.    Conjunctiva pink. Ears:  Normal auditory acuity. Nose:  No deformity, discharge,  or lesions. Mouth:  No deformity or lesions.   Neck:  Supple; no masses or thyromegaly. Lungs:  Clear throughout to auscultation.   No wheezes, crackles, or rhonchi. Heart:  Regular rate and rhythm, no edema  Abdomen:  Soft,nontender, BS active,nonpalp mass or hsm.   Rectal:  Deferred  Msk:  Symmetrical without gross deformities. . Pulses:  Normal pulses noted. Extremities:  Without clubbing or edema. Neurologic:  Alert and  oriented x4;  grossly normal neurologically. Skin:  Intact without significant lesions or rashes.. Psych:  Alert and cooperative. Normal mood and affect.  Tye Savoy, NP-C @  03/23/2016, 3:41 PM Pager number [phone]  GI ATTENDING  History, prior endoscopy reports reviewed. Patient personally seen and examined. Several family members in room. Agree with consultation note as outlined above. Not sure what happened with the patient earlier today. Atypical for food impaction by history. No difficulty with secretions. Drink liquids a few hours ago without issues. Drink 8 ounces of water with me witnessing without difficulty. Has no complaints. He does however have obvious intermittent problems with dysphagia. Tight peptic stricture based on last endoscopy report. He will require repeat upper endoscopy with esophageal dilation with Dr. Hilarie Fredrickson. We have made him aware of the patient and the recommendation. In the interim, I stressed he take his PPI twice daily without fail. I feel less strongly about Carafate. Until the elective endoscopy, he should stay on no more than full liquids. Okay to go home  [employer]. Geri Seminole., M.D. [facility] Division of Gastroenterology

## 2016-03-27 ENCOUNTER — Encounter: Payer: Self-pay | Admitting: Internal Medicine

## 2016-03-28 ENCOUNTER — Encounter (HOSPITAL_COMMUNITY): Payer: Self-pay | Admitting: Internal Medicine

## 2016-03-28 ENCOUNTER — Telehealth: Payer: Self-pay | Admitting: *Deleted

## 2016-03-28 ENCOUNTER — Ambulatory Visit (INDEPENDENT_AMBULATORY_CARE_PROVIDER_SITE_OTHER): Payer: Medicare Other | Admitting: Family Medicine

## 2016-03-28 ENCOUNTER — Ambulatory Visit (INDEPENDENT_AMBULATORY_CARE_PROVIDER_SITE_OTHER): Payer: Medicare Other

## 2016-03-28 VITALS — BP 107/60 | HR 70 | Temp 97.0°F | Ht 67.0 in | Wt 154.0 lb

## 2016-03-28 DIAGNOSIS — R938 Abnormal findings on diagnostic imaging of other specified body structures: Secondary | ICD-10-CM | POA: Diagnosis not present

## 2016-03-28 DIAGNOSIS — R05 Cough: Secondary | ICD-10-CM

## 2016-03-28 DIAGNOSIS — R059 Cough, unspecified: Secondary | ICD-10-CM

## 2016-03-28 DIAGNOSIS — J181 Lobar pneumonia, unspecified organism: Secondary | ICD-10-CM | POA: Diagnosis not present

## 2016-03-28 DIAGNOSIS — K222 Esophageal obstruction: Secondary | ICD-10-CM | POA: Diagnosis not present

## 2016-03-28 DIAGNOSIS — R918 Other nonspecific abnormal finding of lung field: Secondary | ICD-10-CM

## 2016-03-28 DIAGNOSIS — I4891 Unspecified atrial fibrillation: Secondary | ICD-10-CM | POA: Diagnosis not present

## 2016-03-28 DIAGNOSIS — J189 Pneumonia, unspecified organism: Secondary | ICD-10-CM

## 2016-03-28 DIAGNOSIS — K219 Gastro-esophageal reflux disease without esophagitis: Secondary | ICD-10-CM

## 2016-03-28 DIAGNOSIS — D492 Neoplasm of unspecified behavior of bone, soft tissue, and skin: Secondary | ICD-10-CM

## 2016-03-28 DIAGNOSIS — R9389 Abnormal findings on diagnostic imaging of other specified body structures: Secondary | ICD-10-CM

## 2016-03-28 NOTE — Patient Instructions (Addendum)
Continue to drink plenty of fluids and stay well hydrated Follow-up with urology as planned Follow-up with gastroenterology as planned for future dilatations Continue with proton pump inhibitors Follow-up with cardiology as planned The patient will call his dermatologist to have the skin lesion looked at on the left side of the nose We will arrange for him to have a CT scan of the chest to follow-up on the spiculated lesion that is present following the clearing of his pneumonia

## 2016-03-28 NOTE — Telephone Encounter (Signed)
-----   Message from Jerene Bears, MD sent at 03/27/2016  2:00 PM EST ----- Regarding: FW: follow up Needs EGD in 4 weeks for repeat dilation I think LEC is fine JMP  ----- Message ----- From: Irene Shipper, MD Sent: 03/25/2016   9:53 PM To: Jerene Bears, MD Subject: follow up                                      [name], Tight stricture dilated to 15 mm. See my procedure note. It might be best to dilate him multiple times over short intervals (I recommended repeat dilation in 4 weeks) as I believe that this focal ring-like stricture can slowly dilated up to an effective diameter with some durability. Thereafter, consider prophylactic dilation say every 6-12 months. I have had good success with this strategy in a few patients over the years. As you might expect, they eat much easier and avoid frequent food or pill impactions. Thanks. jp

## 2016-03-28 NOTE — Progress Notes (Signed)
Subjective:    Patient ID: JI FILA, male    DOB: 10-05-1932, 83 y.o.   MRN: [id number]  HPI Patient here today for follow up on recent pneumonia.The patient took the [place] and after few days started feeling much better with his pneumonia. He comes in today for a repeat chest x-ray for follow-up of the pneumonia. In the interim he ended up in the emergency room on January 24 because of swallowing and severe problems with swallowing. He vomited and then the swallowing got better but then he drank some water and had another episode. He was seen in the emergency room and subsequently had an endoscopy and dilatation by the gastroenterologist. Another endoscopy is planned for March 6. He is in good spirits today. The patient denies any chest pain or shortness of breath. He has no trouble with swallowing now since he is had the endoscopy and dilatation. As mentioned earlier he has more dilatations planned. He is not seeing any blood in the stool or had any black tarry bowel movements. He is passing his water well but wears a depends at home he seems to be recovering from all of the urological procedures.    Patient Active Problem List   Diagnosis Date Noted  . Esophageal stricture   . Non-intractable vomiting   . Abdominal pain, epigastric   . Dysphagia   . Atrial fibrillation with rapid ventricular response ([place])   . Elevated troponin   . Atrial fibrillation with RVR ([place]) 06/12/2015  . Pneumonia 06/12/2015  . Leukocytosis 06/12/2015  . Clot retention of urine 06/11/2015  . BPH (benign prostatic hypertrophy) with urinary obstruction 06/11/2015  . Postoperative anemia due to acute blood loss 06/11/2015  . Hematuria 06/10/2015  . Neck pain, bilateral 12/31/2013  . Neuropathy ([place]) 12/31/2013  . Vitamin B 12 deficiency 12/31/2013  . Abnormality of gait 06/07/2013  . Esophageal ulcer with bleeding 04/20/2013  . S/P left and ritght THA, AA 07/21/2011  . Special screening for malignant  neoplasms, colon 01/03/2011  . Personal history of colonic polyps 01/03/2011  . COPD GOLD I 08/24/2010  . GI BLEED 11/17/2009  . ESOPHAGEAL REFLUX 08/25/2009  . BLOOD IN STOOL-MELENA 08/25/2009  . COLONIC POLYPS 07/17/2008  . ANEMIA 07/17/2008  . HYPOTENSION 07/17/2008  . Gastroesophageal reflux disease with esophagitis 07/17/2008  . DIVERTICULOSIS, MILD 07/17/2008  . DEGENERATIVE JOINT DISEASE 07/17/2008  . Osteoporosis 07/17/2008  . BPH (benign prostatic hyperplasia) 07/17/2008  . Hyperprolactinemia ([place]) 10/24/2007  . PITUITARY ADENOMA 06/25/2007  . Hypothyroidism 06/25/2007  . Hyperlipidemia LDL goal <70 06/22/2007  . Coronary atherosclerosis 06/22/2007   Outpatient Encounter Prescriptions as of 03/28/2016  Medication Sig  . albuterol (PROVENTIL HFA;VENTOLIN HFA) 108 (90 Base) MCG/ACT inhaler Inhale 2 puffs into the lungs every 6 (six) hours as needed for wheezing or shortness of breath.  Marland Kitchen arformoterol (BROVANA) 15 MCG/2ML NEBU Take 2 mLs (15 mcg total) by nebulization 2 (two) times daily as needed. (Patient taking differently: Take 15 mcg by nebulization 2 (two) times daily as needed (shortness of breath). )  . bisacodyl (DULCOLAX) 10 MG suppository Place 10 mg rectally as needed for moderate constipation.  . bromocriptine (PARLODEL) 2.5 MG tablet Take 2.5 mg by mouth 2 (two) times daily.  . calcium carbonate (OSCAL) 1500 (600 Ca) MG TABS tablet Take 600 mg of elemental calcium by mouth 2 (two) times daily with a meal.   . cholecalciferol (VITAMIN D) 1000 UNITS tablet Take 1,000 Units by mouth daily.   Marland Kitchen  diltiazem (DILACOR XR) 120 MG 24 hr capsule Take 120 mg by mouth daily.  . ferrous sulfate 325 (65 FE) MG tablet Take 325 mg by mouth daily with breakfast.   . guaiFENesin (MUCINEX) 600 MG 12 hr tablet Take 600 mg by mouth 2 (two) times daily.   Marland Kitchen levothyroxine (SYNTHROID, LEVOTHROID) 50 MCG tablet TAKE 1 TABLET IN MORNING (Patient taking differently: TAKE 50mg  TABLET IN MORNING)    . magnesium oxide (MAG-OX) 400 (241.3 Mg) MG tablet Take 400 mg by mouth every other day.  . montelukast (SINGULAIR) 10 MG tablet Take 10 mg by mouth at bedtime as needed (for allergies).   . naphazoline-pheniramine (NAPHCON-A) 0.025-0.3 % ophthalmic solution Place 1 drop into both eyes 2 (two) times daily as needed for irritation or allergies.   . nitroGLYCERIN (NITROSTAT) 0.4 MG SL tablet Place 0.4 mg under the tongue every 5 (five) minutes as needed for chest pain.   . pantoprazole (PROTONIX) 40 MG tablet Take 1 tablet (40 mg total) by mouth 2 (two) times daily before a meal.  . polyethylene glycol (MIRALAX / GLYCOLAX) packet Take 17 g by mouth daily. (Patient taking differently: Take 17 g by mouth daily as needed for moderate constipation. )  . sucralfate (CARAFATE) 1 g tablet Take 1 tablet (1 g total) by mouth 4 (four) times daily as needed (for GI upset).  . SYMBICORT 160-4.5 MCG/ACT inhaler Inhale 2 puffs into the lungs 2 (two) times daily.  . vitamin B-12 (CYANOCOBALAMIN) 1000 MCG tablet Take 1,000 mcg by mouth daily.  . vitamin E (VITAMIN E) 400 UNIT capsule Take 400 Units by mouth daily.  . [DISCONTINUED] doxycycline (VIBRA-TABS) 100 MG tablet Take 1 tablet (100 mg total) by mouth 2 (two) times daily. 1 po bid  . [DISCONTINUED] levofloxacin (LEVAQUIN) 500 MG tablet Take 1 tablet (500 mg total) by mouth daily.   No facility-administered encounter medications on file as of 03/28/2016.      Review of Systems  Constitutional: Negative.   HENT: Negative.   Eyes: Negative.   Respiratory: Negative.   Cardiovascular: Negative.   Gastrointestinal: Negative.   Endocrine: Negative.   Genitourinary: Negative.   Musculoskeletal: Negative.   Skin: Negative.   Allergic/Immunologic: Negative.   Neurological: Negative.   Hematological: Negative.   Psychiatric/Behavioral: Negative.        Objective:   Physical Exam  Constitutional: He is oriented to person, place, and time. He appears  well-developed and well-nourished. No distress.  HENT:  Head: Normocephalic and atraumatic.  Right Ear: External ear normal.  Left Ear: External ear normal.  Mouth/Throat: Oropharynx is clear and moist. No oropharyngeal exudate.  There is a skin lesion on the left side of the nose, the bridge of the nose where the eyeglasses go over that could be a precancerous or cancerous lesion and the patient will arrange to go see the dermatologist that he usually sees.  Eyes: Conjunctivae and EOM are normal. Pupils are equal, round, and reactive to light. Right eye exhibits no discharge. Left eye exhibits no discharge. No scleral icterus.  Neck: Normal range of motion. Neck supple. No thyromegaly present.  No bruits thyromegaly or anterior cervical adenopathy  Cardiovascular: Normal rate, regular rhythm and normal heart sounds.   No murmur heard. Pulmonary/Chest: Effort normal and breath sounds normal. No respiratory distress. He has no wheezes. He has no rales. He exhibits no tenderness.  No axillary adenopathy and lungs are clear anteriorly and posteriorly  Abdominal: Soft. Bowel sounds are normal.  He exhibits no mass. There is no tenderness. There is no rebound and no guarding.  No liver or spleen enlargement. No bruits. No abdominal masses. No inguinal adenopathy.  Musculoskeletal: Normal range of motion. He exhibits no edema.  Lymphadenopathy:    He has no cervical adenopathy.  Neurological: He is alert and oriented to person, place, and time. He has normal reflexes. No cranial nerve deficit.  Skin: Skin is warm and dry. No rash noted.  Use a worrisome skin lesion on the left side of the nose and the patient will be scheduled to see the dermatologist. He plans to do this himself.  Psychiatric: He has a normal mood and affect. His behavior is normal. Judgment and thought content normal.  Nursing note and vitals reviewed.  BP 107/60 (BP Location: Left Arm)   Pulse 70   Temp 97 F (36.1 C) (Oral)    Ht 5\' 7"  (1.702 m)   Wt 154 lb (69.9 kg)   BMI 24.12 kg/m         Assessment & Plan:  1. Cough -This has improved. His breathing is better. The repeat chest x-ray is concerning for a possible malignancy where the pneumonia was located. He will be scheduled for a CT scan for this. - DG Chest 2 View; Future - CT Chest W Contrast; Future  2. Atrial fibrillation with RVR ([place]) -All up with cardiology as planned  3. Gastroesophageal reflux disease, esophagitis presence not specified -Follow-up with gastroenterology as planned  4. Esophageal stricture -Follow-up with gastroenterology for repeat endoscopy and dilatation  5. Pneumonia of right lower lobe due to infectious organism [facility]) -This appears to have resolved - CT Chest W Contrast; Future  6. Abnormal CXR -There is a spiculated lesion at the base of the right lung with the pneumonia was previously located. - CT Chest W Contrast; Future  7. Lung mass - CT Chest W Contrast; Future  8. Abnormal chest x-ray  9. Neoplasm of skin of face -The patient will make arrangements to see his dermatologist to follow-up on the skin lesion on the left side of his nose.  Patient Instructions  Continue to drink plenty of fluids and stay well hydrated Follow-up with urology as planned Follow-up with gastroenterology as planned for future dilatations Continue with proton pump inhibitors Follow-up with cardiology as planned The patient will call his dermatologist to have the skin lesion looked at on the left side of the nose We will arrange for him to have a CT scan of the chest to follow-up on the spiculated lesion that is present following the clearing of his pneumonia  Arrie Senate MD

## 2016-03-28 NOTE — Telephone Encounter (Signed)
I have spoken to patient and have scheduled his endoscopy in [place] on 05/03/16 @ 10 am. He is also scheduled for previsit on 04/26/16 @ 3:30 pm. He verbalizes understanding.

## 2016-03-29 ENCOUNTER — Encounter: Payer: Self-pay | Admitting: Family Medicine

## 2016-03-29 ENCOUNTER — Ambulatory Visit (HOSPITAL_COMMUNITY)
Admission: RE | Admit: 2016-03-29 | Discharge: 2016-03-29 | Disposition: A | Discharge status: Home / Self Care | Payer: Medicare Other | Source: Ambulatory Visit | Attending: Family Medicine | Admitting: Family Medicine

## 2016-03-29 DIAGNOSIS — I2510 Atherosclerotic heart disease of native coronary artery without angina pectoris: Secondary | ICD-10-CM | POA: Insufficient documentation

## 2016-03-29 DIAGNOSIS — R918 Other nonspecific abnormal finding of lung field: Secondary | ICD-10-CM

## 2016-03-29 DIAGNOSIS — R938 Abnormal findings on diagnostic imaging of other specified body structures: Secondary | ICD-10-CM | POA: Diagnosis not present

## 2016-03-29 DIAGNOSIS — J181 Lobar pneumonia, unspecified organism: Secondary | ICD-10-CM

## 2016-03-29 DIAGNOSIS — J189 Pneumonia, unspecified organism: Secondary | ICD-10-CM | POA: Diagnosis not present

## 2016-03-29 DIAGNOSIS — I700 Atherosclerosis of aorta: Secondary | ICD-10-CM | POA: Insufficient documentation

## 2016-03-29 DIAGNOSIS — R059 Cough, unspecified: Secondary | ICD-10-CM

## 2016-03-29 DIAGNOSIS — R9389 Abnormal findings on diagnostic imaging of other specified body structures: Secondary | ICD-10-CM

## 2016-03-29 DIAGNOSIS — R05 Cough: Secondary | ICD-10-CM | POA: Diagnosis not present

## 2016-03-29 DIAGNOSIS — I712 Thoracic aortic aneurysm, without rupture: Secondary | ICD-10-CM | POA: Insufficient documentation

## 2016-03-29 LAB — BMP8+EGFR
BUN/Creatinine Ratio: 9 — ABNORMAL LOW (ref 10–24)
BUN: 8 mg/dL (ref 8–27)
CO2: 21 mmol/L (ref 18–29)
Calcium: 8.3 mg/dL — ABNORMAL LOW (ref 8.6–10.2)
Chloride: 103 mmol/L (ref 96–106)
Creatinine, Ser: 0.88 mg/dL (ref 0.76–1.27)
GFR calc Af Amer: 93 mL/min/1.73
GFR calc non Af Amer: 81 mL/min/1.73
Glucose: 87 mg/dL (ref 65–99)
Potassium: 3.8 mmol/L (ref 3.5–5.2)
Sodium: 140 mmol/L (ref 134–144)

## 2016-03-29 MED ORDER — IOPAMIDOL (ISOVUE-300) INJECTION 61%
75.0000 mL | Freq: Once | INTRAVENOUS | Status: AC | PRN
  Administered 2016-03-29: 75 mL via INTRAVENOUS

## 2016-03-29 MED ORDER — IOPAMIDOL (ISOVUE-300) INJECTION 61%
INTRAVENOUS | Status: AC
  Filled 2016-03-29: qty 75

## 2016-03-29 MED ORDER — SODIUM CHLORIDE 0.9 % IJ SOLN
INTRAMUSCULAR | Status: AC
  Filled 2016-03-29: qty 50

## 2016-03-30 ENCOUNTER — Telehealth: Payer: Self-pay | Admitting: Family Medicine

## 2016-03-30 ENCOUNTER — Other Ambulatory Visit: Payer: Self-pay | Admitting: *Deleted

## 2016-03-30 ENCOUNTER — Telehealth: Payer: Self-pay | Admitting: Internal Medicine

## 2016-03-30 DIAGNOSIS — I712 Thoracic aortic aneurysm, without rupture: Secondary | ICD-10-CM

## 2016-03-30 DIAGNOSIS — D485 Neoplasm of uncertain behavior of skin: Secondary | ICD-10-CM | POA: Diagnosis not present

## 2016-03-30 DIAGNOSIS — I7121 Aneurysm of the ascending aorta, without rupture: Secondary | ICD-10-CM

## 2016-03-30 DIAGNOSIS — C44321 Squamous cell carcinoma of skin of nose: Secondary | ICD-10-CM | POA: Diagnosis not present

## 2016-03-30 DIAGNOSIS — L570 Actinic keratosis: Secondary | ICD-10-CM | POA: Diagnosis not present

## 2016-03-30 NOTE — Telephone Encounter (Signed)
Spoke with Dr. Tawanna Sat nurse, Roselyn Reef. Roselyn Reef states pt came in on 02-26-16, and was dx with PNA after a CXR. Pt returned to clinic for a 4 week f/u with a cxr prior. CXR then showed a mass, so Dr. Laurance Flatten ordered a CT. CT was performed yesterday. Dr. Laurance Flatten asked that MR review CT and give his opinion.  MR please advise. Thanks.

## 2016-03-30 NOTE — Telephone Encounter (Signed)
x

## 2016-03-30 NOTE — Telephone Encounter (Signed)
Pt aware and referral / xray aware

## 2016-03-30 NOTE — Telephone Encounter (Signed)
CT does not look like classic cancer. Briefly read the chart and with esophgeal issues this might be penumonia. Defintely recommend fu CT chest in 4-8 weeks as clinically indicated  Dr. Brand Males, M.D., [facility].C.P Pulmonary and Critical Care Medicine Staff Physician [facility] Pager: [phone], If no answer or between  15:00h - 7:00h: call [phone]  03/30/2016 1:58 PM     Dg Chest 2 View  Result Date: 03/28/2016 CLINICAL DATA:  Follow-up pneumonia EXAM: CHEST  2 VIEW COMPARISON:  02/06/2016 FINDINGS: There is a spiculated opacity at the lateral right lung base is better visualized today. Surrounding opacity and right pleural effusion have improved. Lungs are hyperaerated. Upper normal heart size. Chronic left rib deformities. IMPRESSION: Spiculated density at the right lung bases worrisome for malignancy. CT chest is recommended. Electronically Signed   By: Marybelle Killings M.D.   On: 03/28/2016 15:33   Ct Chest W Contrast  Result Date: 03/29/2016 CLINICAL DATA:  Right lower lobe pneumonia.  Abnormal chest x-ray. EXAM: CT CHEST WITH CONTRAST TECHNIQUE: Multidetector CT imaging of the chest was performed during intravenous contrast administration. CONTRAST:  34mL ISOVUE-300 IOPAMIDOL (ISOVUE-300) INJECTION 61% COMPARISON:  Chest x-ray 03/28/2016 FINDINGS: Cardiovascular: Heart is normal size. Mild aneurysmal dilatation of the ascending thoracic aorta, 4.2 cm. Atherosclerotic calcifications throughout the aorta and coronary arteries. Mediastinum/Nodes: No mediastinal, hilar, or axillary adenopathy. Small hiatal hernia. Lungs/Pleura: Airspace disease noted peripherally in the right lower lung most compatible with pneumonia. Mucous plugging noted within the airways to the right lower lobe and to a lesser extent left lower lobe. This corresponds to the abnormality seen on recent chest x-ray. No pleural effusion. Minimal patchy ground-glass opacity  posteriorly in the left lower lobe also may reflect an area of early pneumonia. Upper Abdomen: Imaging into the upper abdomen shows no acute findings. Musculoskeletal: Chest wall soft tissues are unremarkable. No acute bony abnormality or focal bone lesion. IMPRESSION: Areas of consolidation peripherally in the right lower lobe most compatible with pneumonia. Minimal patchy density noted posteriorly in the left lower lobe may also reflect early pneumonia. Mucous plugging noted in the right lower lobe airways. Coronary artery disease, aortic atherosclerosis. 4.2 cm ascending thoracic aortic aneurysm. Recommend annual imaging followup by CTA or MRA. This recommendation follows 2010 ACCF/AHA/AATS/ACR/ASA/SCA/SCAI/SIR/STS/SVM Guidelines for the Diagnosis and Management of Patients with Thoracic Aortic Disease. Circulation. 2010; [id number] Electronically Signed   By: Rolm Baptise M.D.   On: 03/29/2016 15:10

## 2016-03-30 NOTE — Telephone Encounter (Signed)
Spoke to [place], and gave her the message per MR. They are also in epic she was able to see his message as well there. Nothing further is needed at this time.

## 2016-03-30 NOTE — Telephone Encounter (Signed)
Please call the patient with this information and repeat CT as recommended by Dr. Chase Caller in 4-8 weeks

## 2016-03-30 NOTE — Telephone Encounter (Signed)
Referral fixed

## 2016-04-05 ENCOUNTER — Other Ambulatory Visit: Payer: Self-pay | Admitting: Family Medicine

## 2016-04-13 ENCOUNTER — Institutional Professional Consult (permissible substitution) (INDEPENDENT_AMBULATORY_CARE_PROVIDER_SITE_OTHER): Payer: Medicare Other | Admitting: Surgery

## 2016-04-13 VITALS — BP 120/74 | HR 61 | Resp 20 | Ht 67.0 in | Wt 152.0 lb

## 2016-04-13 DIAGNOSIS — I712 Thoracic aortic aneurysm, without rupture, unspecified: Secondary | ICD-10-CM

## 2016-04-15 ENCOUNTER — Encounter: Payer: Self-pay | Admitting: Family Medicine

## 2016-04-17 ENCOUNTER — Encounter: Payer: Self-pay | Admitting: Surgery

## 2016-04-17 NOTE — Progress Notes (Signed)
PCP is Redge Gainer, MD Referring Provider is Chipper Herb, MD  Chief Complaint  Patient presents with  . Thoracic Aortic Aneurysm    Surgical eval, Chest CT 03/29/16, ECHO 06/14/15    HPI:  The patient is an 83 year old gentleman with a history of atrial fibrillation and coronary artery disease who was treated for pneumonia last month. He had a follow up CXR showing a spiculated lesion at the right lung base where the pneumonia was. Therefore a chest CT scan was performed on 03/29/2016 showed an area of consolidation peripherally in the RLL consistent with pneumonia and corresponding to the lesion seen on CXR. This also showed an incidental 4.2 cm fusiform ascending aortic aneurysm. A CTA of the chest had been done previously on 09/20/2012 to rule out PE after hip surgery and this showed the aneurysm to measure 4.5 cm. He had 4 CT scans prior to that dating back to 04/2007 at which time it measured 4.4 cm.    Past Medical History:  Diagnosis Date  . Abnormality of gait 06/07/2013  . Anemia   . Anginal pain (HCC)    hx of   . Arthritis   . Asthma   . Atrial fibrillation ([place])   . Blood transfusion june 2011, 5 units given   july 2011 some units given  . CAD (coronary artery disease) 03/2006   3.0 x 20 mm TAXUS Perseus DES to the LAD; 01/2007  L main 30%, oLAD 50%, pLAD stent ok, CFX 80%, OM 60%, pRCA 60%, mRCA 70%, oPDA 90%; med rx   . Cancer ([place])    skin cancer   . Cataract    bilateral removal of cateracts  . Complication of anesthesia     no issues,but pt prefers spinal due to Pulmonary problems  . COPD (chronic obstructive pulmonary disease) ([place])    oxygen  on standby in home.  . Depression   . Diverticulosis   . Dizziness    occasional  . Emphysema   . Enlarged prostate    elevated psa recently  . Enlarged prostate with urinary retention   . Esophageal stenosis   . GERD (gastroesophageal reflux disease)   . Hiatal hernia   . Hypothyroidism   . Mass, scrotum      L scrotum - Sees Dr. Jeffie Pollock  . Neuropathy ([place])   . Pituitary macroadenoma ([place])   . Pneumonia    No recent.  Marland Kitchen Restless legs syndrome (RLS)   . Transfusion history    history 4 yrs ago.   Marland Kitchen UGIB (upper gastrointestinal bleed) 03/2013   EGD w/ large ulcer at GE junction  . UGIB (upper gastrointestinal bleed) 08/09/2009   At GE junction, rx w/ endoclips  . UGIB (upper gastrointestinal bleed) 09/2009   just above GE junction, rx  w/ 2 endoclips    Past Surgical History:  Procedure Laterality Date  . ABDOMINAL HERNIA REPAIR   2008  . CARDIAC CATHETERIZATION  01/2007   L main 30%, oLAD 50%,  pLAD stent ok, CFX 80%, OM 60%, pRCA 60%, mRCA 70%, oPDA 90%; med rx  . cataract extraction both eyes    . COLONOSCOPY    . CORONARY STENT PLACEMENT  03/2006   3.0 x 20 mm TAXUS Perseus DES to the LAD  . CYSTOSCOPY N/A 06/10/2015   Procedure: CYSTOSCOPY FULGRATION OF BLEEDING,  electovapor resection;  Surgeon: Irine Seal, MD;  Location: [facility];  Service: Urology;  Laterality: N/A;  . CYSTOSCOPY  WITH INSERTION OF UROLIFT N/A 06/01/2015   Procedure: CYSTOSCOPY WITH INSERTION OF UROLIFT x4;  Surgeon: Irine Seal, MD;  Location: [facility];  Service: Urology;  Laterality: N/A;  . ESOPHAGOGASTRODUODENOSCOPY N/A 04/20/2013   Procedure: ESOPHAGOGASTRODUODENOSCOPY (EGD);  Surgeon: Inda Castle, MD;  Location: [facility];  Service: Endoscopy;  Laterality: N/A;  . ESOPHAGOGASTRODUODENOSCOPY N/A 03/25/2016   Procedure: ESOPHAGOGASTRODUODENOSCOPY (EGD);  Surgeon: Irene Shipper, MD;  Location: [facility];  Service: Endoscopy;  Laterality: N/A;  . ESOPHAGOGASTRODUODENOSCOPY (EGD) WITH PROPOFOL N/A 10/13/2015   Procedure: ESOPHAGOGASTRODUODENOSCOPY (EGD) WITH PROPOFOL;  Surgeon: Jerene Bears, MD;  Location: [facility];  Service: Gastroenterology;  Laterality: N/A;  . FOOT SURGERY  1994 left, 2002 right foot   bilateral foot reconstruciton  . FOOT SURGERY     reconstruction of both feet- no retained hardware.   Marland Kitchen HIATAL HERNIA REPAIR  01-04-2008  . PROSTATE SURGERY     x 2  . SAVORY DILATION N/A 03/25/2016   Procedure: SAVORY DILATION;  Surgeon: Irene Shipper, MD;  Location: [facility];  Service: Endoscopy;  Laterality: N/A;  . TOTAL HIP ARTHROPLASTY  07/21/2011   Procedure: TOTAL HIP ARTHROPLASTY ANTERIOR APPROACH;  Surgeon: Mauri Pole, MD;  Location: [facility];  Service: Orthopedics;  Laterality: Left;  . TOTAL HIP ARTHROPLASTY Right 09/07/2012   Procedure: RIGHT TOTAL HIP ARTHROPLASTY ANTERIOR APPROACH;  Surgeon: Mcarthur Rossetti, MD;  Location: [facility];  Service: Orthopedics;  Laterality: Right;  . TRANSURETHRAL RESECTION OF BLADDER NECK N/A 11/04/2015   Procedure: RESECTION OF BLADDER NECK;  Surgeon: Cleon Gustin, MD;  Location: [facility];  Service: Urology;  Laterality: N/A;  . TRANSURETHRAL RESECTION OF PROSTATE N/A 11/04/2015   Procedure: TRANSURETHRAL RESECTION OF THE PROSTATE (TURP); REMOVAL OF UROLIFT IMPLANTS X THREE;  Surgeon: Cleon Gustin, MD;  Location: [facility];  Service: Urology;  Laterality: N/A;  . UPPER GASTROINTESTINAL ENDOSCOPY  12/2013   Dr Hilarie Fredrickson, gastritis    Family History  Problem Relation Age of Onset  . Heart disease Sister   . Heart failure Father   . Colon cancer Neg Hx   . Esophageal cancer Neg Hx   . Rectal cancer Neg Hx   . Stomach cancer Neg Hx     Social History Social History  Substance Use Topics  . Smoking status: Never Smoker  . Smokeless tobacco: Never Used  . Alcohol use No    Current Outpatient Prescriptions  Medication Sig Dispense Refill  . albuterol (PROVENTIL HFA;VENTOLIN HFA) 108 (90 Base) MCG/ACT inhaler Inhale 2 puffs into the lungs every 6 (six) hours as needed for wheezing or shortness of breath. 18 g 11  . arformoterol (BROVANA) 15 MCG/2ML NEBU Take 2 mLs (15 mcg total) by nebulization 2 (two) times daily as needed. (Patient taking differently: Take 15 mcg by nebulization 2 (two) times daily as needed (shortness of breath). )  120 mL 3  . bisacodyl (DULCOLAX) 10 MG suppository Place 10 mg rectally as needed for moderate constipation.    . bromocriptine (PARLODEL) 2.5 MG tablet TAKE  (1)  TABLET TWICE A DAY. 60 tablet 5  . calcium carbonate (OSCAL) 1500 (600 Ca) MG TABS tablet Take 600 mg of elemental calcium by mouth 2 (two) times daily with a meal.     . cholecalciferol (VITAMIN D) 1000 UNITS tablet Take 1,000 Units by mouth daily.     Marland Kitchen diltiazem (DILACOR XR) 120 MG 24 hr capsule Take 120 mg by mouth daily.    Marland Kitchen  ferrous sulfate 325 (65 FE) MG tablet Take 325 mg by mouth daily with breakfast.     . guaiFENesin (MUCINEX) 600 MG 12 hr tablet Take 600 mg by mouth 2 (two) times daily.     Marland Kitchen levothyroxine (SYNTHROID, LEVOTHROID) 50 MCG tablet TAKE 1 TABLET IN MORNING (Patient taking differently: TAKE 50mg  TABLET IN MORNING) 30 tablet 0  . magnesium oxide (MAG-OX) 400 (241.3 Mg) MG tablet Take 400 mg by mouth every other day.    . montelukast (SINGULAIR) 10 MG tablet Take 10 mg by mouth at bedtime as needed (for allergies).     . naphazoline-pheniramine (NAPHCON-A) 0.025-0.3 % ophthalmic solution Place 1 drop into both eyes 2 (two) times daily as needed for irritation or allergies.     . nitroGLYCERIN (NITROSTAT) 0.4 MG SL tablet Place 0.4 mg under the tongue every 5 (five) minutes as needed for chest pain.     . pantoprazole (PROTONIX) 40 MG tablet Take 1 tablet (40 mg total) by mouth 2 (two) times daily before a meal. 60 tablet 11  . polyethylene glycol (MIRALAX / GLYCOLAX) packet Take 17 g by mouth daily. (Patient taking differently: Take 17 g by mouth daily as needed for moderate constipation. ) 30 each 3  . sucralfate (CARAFATE) 1 g tablet Take 1 tablet (1 g total) by mouth 4 (four) times daily as needed (for GI upset). 120 tablet 3  . SYMBICORT 160-4.5 MCG/ACT inhaler Inhale 2 puffs into the lungs 2 (two) times daily. 10.2 g 5  . vitamin B-12 (CYANOCOBALAMIN) 1000 MCG tablet Take 1,000 mcg by mouth daily.    . vitamin E  (VITAMIN E) 400 UNIT capsule Take 400 Units by mouth daily.     No current facility-administered medications for this visit.     Allergies  Allergen Reactions  . Betadine [Povidone Iodine] Other (See Comments)    Reaction:  Blisters   . Lortab [Hydrocodone-Acetaminophen] Nausea And Vomiting  . Penicillins Other (See Comments)    Reaction:  Lightheadedness  Has patient had a PCN reaction causing immediate rash, facial/tongue/throat swelling, SOB or lightheadedness with hypotension: Yes Has patient had a PCN reaction causing severe rash involving mucus membranes or skin necrosis: No Has patient had a PCN reaction that required hospitalization No Has patient had a PCN reaction occurring within the last 10 years: No If all of the above answers are "NO", then may proceed with Cephalosporin use.  Marland Kitchen Zetia [Ezetimibe] Other (See Comments)    Reaction:  Muscle weakness   . Zocor [Simvastatin] Nausea Only and Other (See Comments)    Reaction:  Muscle weakness     Review of Systems  Constitutional: Negative.   HENT: Negative.   Eyes: Negative.   Respiratory: Positive for shortness of breath.        With exertion  Cardiovascular: Positive for palpitations.  Gastrointestinal: Positive for constipation.       Dysphagia s/p endoscopy last month.  Hiatal hernia and heart burn  Endocrine: Negative.   Genitourinary: Negative.   Musculoskeletal: Positive for gait problem.  Skin: Negative.   Allergic/Immunologic: Negative.   Neurological:       Neuropathy  Hematological: Bruises/bleeds easily.  Psychiatric/Behavioral: The patient is nervous/anxious.        Depression    BP 120/74   Pulse 61   Resp 20   Ht 5\' 7"  (1.702 m)   Wt 152 lb (68.9 kg)   SpO2 95% Comment: RA  BMI 23.81 kg/m  Physical Exam  Constitutional: He is oriented to person, place, and time. He appears well-developed and well-nourished. No distress.  HENT:  Head: Normocephalic and atraumatic.  Mouth/Throat:  Oropharynx is clear and moist.  Skin lesion over left nose that he getting checked out by a dermatologist  Eyes: EOM are normal. Pupils are equal, round, and reactive to light.  Neck: Normal range of motion. Neck supple. No JVD present. No thyromegaly present.  Cardiovascular: Normal rate, regular rhythm and normal heart sounds.   No murmur heard. Pulmonary/Chest: Effort normal and breath sounds normal. No respiratory distress. He has no wheezes.  Abdominal: Soft. Bowel sounds are normal. He exhibits no distension and no mass. There is no tenderness.  Musculoskeletal: Normal range of motion. He exhibits no edema.  Lymphadenopathy:    He has no cervical adenopathy.  Neurological: He is alert and oriented to person, place, and time.  Skin: Skin is warm and dry.  Psychiatric: He has a normal mood and affect.     Diagnostic Tests:  Study Result   CLINICAL DATA:  Right lower lobe pneumonia.  Abnormal chest x-ray.  EXAM: CT CHEST WITH CONTRAST  TECHNIQUE: Multidetector CT imaging of the chest was performed during intravenous contrast administration.  CONTRAST:  25mL ISOVUE-300 IOPAMIDOL (ISOVUE-300) INJECTION 61%  COMPARISON:  Chest x-ray 03/28/2016  FINDINGS: Cardiovascular: Heart is normal size. Mild aneurysmal dilatation of the ascending thoracic aorta, 4.2 cm. Atherosclerotic calcifications throughout the aorta and coronary arteries.  Mediastinum/Nodes: No mediastinal, hilar, or axillary adenopathy. Small hiatal hernia.  Lungs/Pleura: Airspace disease noted peripherally in the right lower lung most compatible with pneumonia. Mucous plugging noted within the airways to the right lower lobe and to a lesser extent left lower lobe. This corresponds to the abnormality seen on recent chest x-ray. No pleural effusion. Minimal patchy ground-glass opacity posteriorly in the left lower lobe also may reflect an area of early pneumonia.  Upper Abdomen: Imaging into the  upper abdomen shows no acute findings.  Musculoskeletal: Chest wall soft tissues are unremarkable. No acute bony abnormality or focal bone lesion.  IMPRESSION: Areas of consolidation peripherally in the right lower lobe most compatible with pneumonia. Minimal patchy density noted posteriorly in the left lower lobe may also reflect early pneumonia. Mucous plugging noted in the right lower lobe airways.  Coronary artery disease, aortic atherosclerosis.  4.2 cm ascending thoracic aortic aneurysm. Recommend annual imaging followup by CTA or MRA. This recommendation follows 2010 ACCF/AHA/AATS/ACR/ASA/SCA/SCAI/SIR/STS/SVM Guidelines for the Diagnosis and Management of Patients with Thoracic Aortic Disease. Circulation. 2010; 121SP:1689793   Electronically Signed   By: Rolm Baptise M.D.   On: 03/29/2016 15:10     Impression:  He has a 4.2 cm fusiform ascending aortic aneurysm that has been present on CT scans dating back to 2009 when it was measured at 4.4 cm. It has consistently been 4.4-4.5 cm on multiple prior scans over the years. This appears quite stable and is below the usual surgical threshold of 5.5 cm. His previous echo in 05/2015 showed a trileaflet aortic valve with no AI. This aneurysm can be followed yearly with CT without contrast but I think it is unlikely that it will enlarge enough to warrant surgery in this 83 year old patient. His BP is under good control. He is not on a beta blocker due to asthma. I reviewed the CT scan films with him and compared them to the prior films and answered his questions.   Plan:  I will plan to see him back  in one year with a CT scan of the chest without contrast.  Gaye Pollack, MD [facility] [phone]

## 2016-04-25 ENCOUNTER — Other Ambulatory Visit: Payer: Self-pay | Admitting: Endocrinology

## 2016-04-26 ENCOUNTER — Encounter: Payer: Self-pay | Admitting: Internal Medicine

## 2016-04-26 ENCOUNTER — Ambulatory Visit (AMBULATORY_SURGERY_CENTER): Payer: Self-pay

## 2016-04-26 VITALS — Ht 65.5 in | Wt 155.4 lb

## 2016-04-26 DIAGNOSIS — K222 Esophageal obstruction: Secondary | ICD-10-CM

## 2016-04-26 NOTE — Progress Notes (Signed)
No allergies to eggs or soy No diet meds No home oxygen "Been put to sleep so many times I dont get back up to my baseline with memory and such things"  Declined emmi

## 2016-05-03 ENCOUNTER — Encounter: Payer: Medicare Other | Admitting: Internal Medicine

## 2016-05-03 ENCOUNTER — Encounter: Payer: Self-pay | Admitting: Internal Medicine

## 2016-05-03 ENCOUNTER — Ambulatory Visit (AMBULATORY_SURGERY_CENTER): Payer: Medicare Other | Admitting: Internal Medicine

## 2016-05-03 VITALS — BP 115/65 | HR 58 | Temp 98.0°F | Resp 16 | Ht 65.0 in | Wt 155.0 lb

## 2016-05-03 DIAGNOSIS — R1319 Other dysphagia: Secondary | ICD-10-CM

## 2016-05-03 DIAGNOSIS — R1310 Dysphagia, unspecified: Secondary | ICD-10-CM

## 2016-05-03 DIAGNOSIS — K222 Esophageal obstruction: Secondary | ICD-10-CM

## 2016-05-03 MED ORDER — SODIUM CHLORIDE 0.9 % IV SOLN: 500.0000 mL | INTRAVENOUS | Status: DC

## 2016-05-03 NOTE — Op Note (Signed)
[facility] Patient Name: [name] Procedure Date: 05/03/2016 9:38 AM MRN: [id number] Endoscopist: Jerene Bears , MD Age: 83 Referring MD:  Date of Birth: 1933-02-09 Gender: Male Account #: [id number] Procedure:                Upper GI endoscopy Indications:              Dysphagia, For therapy of esophageal stenosis Medicines:                Monitored Anesthesia Care Procedure:                Pre-Anesthesia Assessment:                           - Prior to the procedure, a History and Physical                            was performed, and patient medications and                            allergies were reviewed. The patient's tolerance of                            previous anesthesia was also reviewed. The risks                            and benefits of the procedure and the sedation                            options and risks were discussed with the patient.                            All questions were answered, and informed consent                            was obtained. Prior Anticoagulants: The patient has                            taken no previous anticoagulant or antiplatelet                            agents. ASA Grade Assessment: III - A patient with                            severe systemic disease. After reviewing the risks                            and benefits, the patient was deemed in                            satisfactory condition to undergo the procedure.                           After obtaining informed consent, the endoscope was  passed under direct vision. Throughout the                            procedure, the patient's blood pressure, pulse, and                            oxygen saturations were monitored continuously. The                            Endoscope was introduced through the mouth, and                            advanced to the second part of duodenum. The upper                            GI endoscopy  was accomplished without difficulty.                            The patient tolerated the procedure well. Scope In: Scope Out: Findings:                 One moderate (circumferential scarring or stenosis;                            an endoscope may pass) benign-appearing, intrinsic                            stenosis was found 34 to 35 cm from the incisors.                            This measured 1 cm (in length) and was traversed. A                            TTS dilator was passed through the scope. Dilation                            with a 13.5-14.5-15.5 mm balloon dilator was                            performed to 15.5 mm. The dilation site was                            examined and showed moderate improvement in luminal                            narrowing. Estimated blood loss was minimal.                           A 5 cm hiatal hernia was present.                           Localized mild inflammation characterized by  erythema was found in the prepyloric region of the                            stomach.                           The examined duodenum was normal. Complications:            No immediate complications. Estimated Blood Loss:     Estimated blood loss was minimal. Impression:               - Benign-appearing esophageal stenosis. Dilated.                           - 5 cm hiatal hernia.                           - Mild non-bleeding gastritis.                           - Normal examined duodenum.                           - No specimens collected. Recommendation:           - Patient has a contact number available for                            emergencies. The signs and symptoms of potential                            delayed complications were discussed with the                            patient. Return to normal activities tomorrow.                            Written discharge instructions were provided to the                             patient.                           - Soft diet.                           - Continue present medications. Recommend                            pantoprazole 40 mg twice daily before breakfast and                            dinner. Carafate slurry 1g can be used before meals                            and at bedtime.                           -  Repeat upper endoscopy in 8 weeks for retreatment. Jerene Bears, MD 05/03/2016 10:11:51 AM This report has been signed electronically.

## 2016-05-03 NOTE — Progress Notes (Signed)
Pt's states no medical or surgical changes since previsit or office visit. 

## 2016-05-03 NOTE — Progress Notes (Signed)
Called to room to assist during endoscopic procedure.  Patient ID and intended procedure confirmed with present staff. Received instructions for my participation in the procedure from the performing physician.  

## 2016-05-03 NOTE — Progress Notes (Signed)
Report to PACU, RN, vss, BBS= Clear.  

## 2016-05-03 NOTE — Progress Notes (Signed)
January CT Scan of lungs showed a spot but could be residual of pneumonia. Repeat scan in 6 weeks from original scan in January.

## 2016-05-03 NOTE — Patient Instructions (Signed)
YOU HAD AN ENDOSCOPIC PROCEDURE TODAY AT [facility]:   Refer to the procedure report that was given to you for any specific questions about what was found during the examination.  If the procedure report does not answer your questions, please call your gastroenterologist to clarify.  If you requested that your care partner not be given the details of your procedure findings, then the procedure report has been included in a sealed envelope for you to review at your convenience later.  YOU SHOULD EXPECT: Some feelings of bloating in the abdomen. Passage of more gas than usual.  Walking can help get rid of the air that was put into your GI tract during the procedure and reduce the bloating. If you had a lower endoscopy (such as a colonoscopy or flexible sigmoidoscopy) you may notice spotting of blood in your stool or on the toilet paper. If you underwent a bowel prep for your procedure, you may not have a normal bowel movement for a few days.  Please Note:  You might notice some irritation and congestion in your nose or some drainage.  This is from the oxygen used during your procedure.  There is no need for concern and it should clear up in a day or so.  SYMPTOMS TO REPORT IMMEDIATELY:    Following upper endoscopy (EGD)  Vomiting of blood or coffee ground material  New chest pain or pain under the shoulder blades  Painful or persistently difficult swallowing  New shortness of breath  Fever of 100F or higher  Black, tarry-looking stools  For urgent or emergent issues, a gastroenterologist can be reached at any hour by calling [phone].   DIET:  We do recommend a small meal at first, but then you may proceed to your regular diet.  Drink plenty of fluids but you should avoid alcoholic beverages for 24 hours.  ACTIVITY:  You should plan to take it easy for the rest of today and you should NOT DRIVE or use heavy machinery until tomorrow (because of the sedation medicines used  during the test).    FOLLOW UP: Our staff will call the number listed on your records the next business day following your procedure to check on you and address any questions or concerns that you may have regarding the information given to you following your procedure. If we do not reach you, we will leave a message.  However, if you are feeling well and you are not experiencing any problems, there is no need to return our call.  We will assume that you have returned to your regular daily activities without incident.  If any biopsies were taken you will be contacted by phone or by letter within the next 1-3 weeks.  Please call us at [phone] if you have not heard about the biopsies in 3 weeks.    SIGNATURES/CONFIDENTIALITY: You and/or your care partner have signed paperwork which will be entered into your electronic medical record.  These signatures attest to the fact that that the information above on your After Visit Summary has been reviewed and is understood.  Full responsibility of the confidentiality of this discharge information lies with you and/or your care-partner  Stricture, gastritis, hiatal hernia information given. After dilation diet given with instructions. Recall endoscopy 8 weeks.  Please stick to mostly soft foods.  You may have some peanuts, but chew thoroughly before swallowing.  You Arash't have to puree foods, but make sure they are soft. Instructions per Dr.  Pyrtle.Marland Kitchen

## 2016-05-04 ENCOUNTER — Encounter: Payer: Self-pay | Admitting: Internal Medicine

## 2016-05-04 ENCOUNTER — Telehealth: Payer: Self-pay | Admitting: *Deleted

## 2016-05-04 ENCOUNTER — Encounter: Payer: Self-pay | Admitting: Family Medicine

## 2016-05-04 DIAGNOSIS — K2970 Gastritis, unspecified, without bleeding: Secondary | ICD-10-CM | POA: Insufficient documentation

## 2016-05-04 DIAGNOSIS — K449 Diaphragmatic hernia without obstruction or gangrene: Secondary | ICD-10-CM | POA: Insufficient documentation

## 2016-05-04 NOTE — Telephone Encounter (Signed)
  Follow up Call-  Call back number 05/03/2016 01/27/2014  Post procedure Call Back phone  # [phone] hm  Permission to leave phone message Yes Yes  Some recent data might be hidden     Patient questions:  Do you have a fever, pain , or abdominal swelling? No. Pain Score  0 *  Have you tolerated food without any problems? Yes.    Have you been able to return to your normal activities? Yes.    Do you have any questions about your discharge instructions: Diet   No. Medications  No. Follow up visit  No.  Do you have questions or concerns about your Care? No.  Actions: * If pain score is 4 or above: No action needed, pain <4.

## 2016-05-06 ENCOUNTER — Encounter: Payer: Self-pay | Admitting: Endocrinology

## 2016-05-06 ENCOUNTER — Ambulatory Visit (INDEPENDENT_AMBULATORY_CARE_PROVIDER_SITE_OTHER): Payer: Medicare Other | Admitting: Endocrinology

## 2016-05-06 VITALS — BP 122/86 | HR 65 | Ht 67.0 in | Wt 154.0 lb

## 2016-05-06 DIAGNOSIS — E221 Hyperprolactinemia: Secondary | ICD-10-CM

## 2016-05-06 LAB — T4, FREE: Free T4: 0.88 ng/dL (ref 0.60–1.60)

## 2016-05-06 LAB — TSH: TSH: 3.72 u[IU]/mL (ref 0.35–4.50)

## 2016-05-06 NOTE — Progress Notes (Signed)
Subjective:    Patient ID: [name], male    DOB: 13-Nov-1932, 83 y.o.   MRN: [id number]  HPI the status of at least 3 ongoing medical problems is addressed today: pituitary macroadenoma (found incidentally on MRI of the C-spine in 2009; he sees Dr Carloyn Manner for this; last MRI in 2016 was unchanged from 2014):  no headache.   hyperprolactinemia: (dx'ed 2009; he was rx'ed parlodel, but dosage has been limited by dizziness) lightheadedness is less recently.  He takes the full dosage.   hypothyroidism:  He has dry skin.  He takes synthroid as rx'ed.  Past Medical History:  Diagnosis Date  . Abnormality of gait 06/07/2013  . Anemia   . Anginal pain (HCC)    hx of   . Arthritis   . Asthma   . Atrial fibrillation ([place])   . Blood transfusion june 2011, 5 units given   july 2011 some units given  . CAD (coronary artery disease) 03/2006   3.0 x 20 mm TAXUS Perseus DES to the LAD; 01/2007  L main 30%, oLAD 50%, pLAD stent ok, CFX 80%, OM 60%, pRCA 60%, mRCA 70%, oPDA 90%; med rx   . Cancer ([place])    skin cancer   . Cataract    bilateral removal of cateracts  . Complication of anesthesia     no issues,but pt prefers spinal due to Pulmonary problems  . COPD (chronic obstructive pulmonary disease) ([place])    oxygen  on standby in home.  . Depression   . Diverticulosis   . Dizziness    occasional  . Emphysema   . Enlarged prostate    elevated psa recently  . Enlarged prostate with urinary retention   . Esophageal stenosis   . GERD (gastroesophageal reflux disease)   . Hiatal hernia   . Hypothyroidism   . Mass, scrotum    L scrotum - Sees Dr. Jeffie Pollock  . Neuropathy ([place])   . Pituitary macroadenoma ([place])   . Pneumonia    No recent.  Marland Kitchen Restless legs syndrome (RLS)   . Transfusion history    history 4 yrs ago.   Marland Kitchen UGIB (upper gastrointestinal bleed) 03/2013   EGD w/ large ulcer at GE junction  . UGIB (upper gastrointestinal bleed) 08/09/2009   At GE junction, rx w/ endoclips  . UGIB  (upper gastrointestinal bleed) 09/2009   just above GE junction, rx  w/ 2 endoclips    Past Surgical History:  Procedure Laterality Date  . ABDOMINAL HERNIA REPAIR   2008  . CARDIAC CATHETERIZATION  01/2007   L main 30%, oLAD 50%,  pLAD stent ok, CFX 80%, OM 60%, pRCA 60%, mRCA 70%, oPDA 90%; med rx  . cataract extraction both eyes    . COLONOSCOPY    . CORONARY STENT PLACEMENT  03/2006   3.0 x 20 mm TAXUS Perseus DES to the LAD  . CYSTOSCOPY N/A 06/10/2015   Procedure: CYSTOSCOPY FULGRATION OF BLEEDING,  electovapor resection;  Surgeon: Irine Seal, MD;  Location: [facility];  Service: Urology;  Laterality: N/A;  . CYSTOSCOPY WITH INSERTION OF UROLIFT N/A 06/01/2015   Procedure: CYSTOSCOPY WITH INSERTION OF UROLIFT x4;  Surgeon: Irine Seal, MD;  Location: [facility];  Service: Urology;  Laterality: N/A;  . ESOPHAGOGASTRODUODENOSCOPY N/A 04/20/2013   Procedure: ESOPHAGOGASTRODUODENOSCOPY (EGD);  Surgeon: Inda Castle, MD;  Location: [facility];  Service: Endoscopy;  Laterality: N/A;  . ESOPHAGOGASTRODUODENOSCOPY N/A 03/25/2016   Procedure: ESOPHAGOGASTRODUODENOSCOPY (EGD);  Surgeon: Docia Chuck  Henrene Pastor, MD;  Location: [facility];  Service: Endoscopy;  Laterality: N/A;  . ESOPHAGOGASTRODUODENOSCOPY (EGD) WITH PROPOFOL N/A 10/13/2015   Procedure: ESOPHAGOGASTRODUODENOSCOPY (EGD) WITH PROPOFOL;  Surgeon: Jerene Bears, MD;  Location: [facility];  Service: Gastroenterology;  Laterality: N/A;  . FOOT SURGERY  1994 left, 2002 right foot   bilateral foot reconstruciton  . FOOT SURGERY     reconstruction of both feet- no retained hardware.  Marland Kitchen HIATAL HERNIA REPAIR  01-04-2008  . PROSTATE SURGERY     x 2  . SAVORY DILATION N/A 03/25/2016   Procedure: SAVORY DILATION;  Surgeon: Irene Shipper, MD;  Location: [facility];  Service: Endoscopy;  Laterality: N/A;  . TOTAL HIP ARTHROPLASTY  07/21/2011   Procedure: TOTAL HIP ARTHROPLASTY ANTERIOR APPROACH;  Surgeon: Mauri Pole, MD;  Location: [facility];  Service:  Orthopedics;  Laterality: Left;  . TOTAL HIP ARTHROPLASTY Right 09/07/2012   Procedure: RIGHT TOTAL HIP ARTHROPLASTY ANTERIOR APPROACH;  Surgeon: Mcarthur Rossetti, MD;  Location: [facility];  Service: Orthopedics;  Laterality: Right;  . TRANSURETHRAL RESECTION OF BLADDER NECK N/A 11/04/2015   Procedure: RESECTION OF BLADDER NECK;  Surgeon: Cleon Gustin, MD;  Location: [facility];  Service: Urology;  Laterality: N/A;  . TRANSURETHRAL RESECTION OF PROSTATE N/A 11/04/2015   Procedure: TRANSURETHRAL RESECTION OF THE PROSTATE (TURP); REMOVAL OF UROLIFT IMPLANTS X THREE;  Surgeon: Cleon Gustin, MD;  Location: [facility];  Service: Urology;  Laterality: N/A;  . UPPER GASTROINTESTINAL ENDOSCOPY  12/2013   Dr Hilarie Fredrickson, gastritis    Social History   Social History  . Marital status: Widowed    Spouse name: N/A  . Number of children: 2  . Years of education: hs   Occupational History  . retired    Social History Main Topics  . Smoking status: Never Smoker  . Smokeless tobacco: Never Used  . Alcohol use No  . Drug use: No  . Sexual activity: No   Other Topics Concern  . Not on file   Social History Narrative   Patient drinks caffeine a few times a week.   Patient is right handed.    Current Outpatient Prescriptions on File Prior to Visit  Medication Sig Dispense Refill  . albuterol (PROVENTIL HFA;VENTOLIN HFA) 108 (90 Base) MCG/ACT inhaler Inhale 2 puffs into the lungs every 6 (six) hours as needed for wheezing or shortness of breath. 18 g 11  . arformoterol (BROVANA) 15 MCG/2ML NEBU Take 2 mLs (15 mcg total) by nebulization 2 (two) times daily as needed. (Patient taking differently: Take 15 mcg by nebulization 2 (two) times daily as needed (shortness of breath). ) 120 mL 3  . bisacodyl (DULCOLAX) 10 MG suppository Place 10 mg rectally as needed for moderate constipation.    . bromocriptine (PARLODEL) 2.5 MG tablet TAKE  (1)  TABLET TWICE A DAY. 60 tablet 5  . calcium carbonate (OSCAL)  1500 (600 Ca) MG TABS tablet Take 600 mg of elemental calcium by mouth 2 (two) times daily with a meal.     . cholecalciferol (VITAMIN D) 1000 UNITS tablet Take 1,000 Units by mouth daily.     Marland Kitchen diltiazem (DILACOR XR) 120 MG 24 hr capsule Take 120 mg by mouth daily.    . ferrous sulfate 325 (65 FE) MG tablet Take 325 mg by mouth daily with breakfast.     . guaiFENesin (MUCINEX) 600 MG 12 hr tablet Take 600 mg by mouth 2 (two) times daily.     Marland Kitchen  levothyroxine (SYNTHROID, LEVOTHROID) 50 MCG tablet TAKE 1 TABLET IN MORNING 30 tablet 0  . magnesium oxide (MAG-OX) 400 (241.3 Mg) MG tablet Take 400 mg by mouth every other day.    . montelukast (SINGULAIR) 10 MG tablet Take 10 mg by mouth at bedtime as needed (for allergies).     . naphazoline-pheniramine (NAPHCON-A) 0.025-0.3 % ophthalmic solution Place 1 drop into both eyes 2 (two) times daily as needed for irritation or allergies.     . nitroGLYCERIN (NITROSTAT) 0.4 MG SL tablet Place 0.4 mg under the tongue every 5 (five) minutes as needed for chest pain.     . pantoprazole (PROTONIX) 40 MG tablet Take 1 tablet (40 mg total) by mouth 2 (two) times daily before a meal. 60 tablet 11  . polyethylene glycol (MIRALAX / GLYCOLAX) packet Take 17 g by mouth daily. (Patient taking differently: Take 17 g by mouth daily as needed for moderate constipation. ) 30 each 3  . sucralfate (CARAFATE) 1 g tablet Take 1 tablet (1 g total) by mouth 4 (four) times daily as needed (for GI upset). 120 tablet 3  . SYMBICORT 160-4.5 MCG/ACT inhaler Inhale 2 puffs into the lungs 2 (two) times daily. 10.2 g 5  . vitamin B-12 (CYANOCOBALAMIN) 1000 MCG tablet Take 1,000 mcg by mouth daily.    . vitamin E (VITAMIN E) 400 UNIT capsule Take 400 Units by mouth daily.     Current Facility-Administered Medications on File Prior to Visit  Medication Dose Route Frequency Provider Last Rate Last Dose  . 0.9 %  sodium chloride infusion  500 mL Intravenous Continuous Jerene Bears, MD         Allergies  Allergen Reactions  . Betadine [Povidone Iodine] Other (See Comments)    Reaction:  Blisters   . Lortab [Hydrocodone-Acetaminophen] Nausea And Vomiting  . Penicillins Other (See Comments)    Reaction:  Lightheadedness  Has patient had a PCN reaction causing immediate rash, facial/tongue/throat swelling, SOB or lightheadedness with hypotension: Yes Has patient had a PCN reaction causing severe rash involving mucus membranes or skin necrosis: No Has patient had a PCN reaction that required hospitalization No Has patient had a PCN reaction occurring within the last 10 years: No If all of the above answers are "NO", then may proceed with Cephalosporin use.  Marland Kitchen Zetia [Ezetimibe] Other (See Comments)    Reaction:  Muscle weakness   . Zocor [Simvastatin] Nausea Only and Other (See Comments)    Reaction:  Muscle weakness     Family History  Problem Relation Age of Onset  . Heart disease Sister   . Heart failure Father   . Colon cancer Neg Hx   . Esophageal cancer Neg Hx   . Rectal cancer Neg Hx   . Stomach cancer Neg Hx     BP 122/86   Pulse 65   Ht 5\' 7"  (1.702 m)   Wt 154 lb (69.9 kg)   SpO2 97%   BMI 24.12 kg/m    Review of Systems He has lost a few lbs, due to recent pneumonia.  He has depression (widowed 2017).      Objective:   Physical Exam VITAL SIGNS:  See vs page GENERAL: no distress NECK: There is no palpable thyroid enlargement.  No thyroid nodule is palpable.  No palpable lymphadenopathy at the anterior neck.   Prolactin=35 Lab Results  Component Value Date   TSH 3.72 05/06/2016   T4TOTAL 6.2 02/01/2016  Assessment & Plan:  Hyperprolactinemia: this is the best control this pt should aim for, given h/o med intolerance. Please continue the same medication. Hypothyroidism: well-replaced. Please continue the same medication. Pituitary adenoma: in view of advanced age, he does not need f/u in my opinion (unless he develops clinical  indication such as headache or visual loss)

## 2016-05-06 NOTE — Patient Instructions (Signed)
blood tests are requested for you today.  We'll let you know about the results. Please continue the same medications. Please return in 1 year.

## 2016-05-07 LAB — PROLACTIN: Prolactin: 34.5 ng/mL — ABNORMAL HIGH (ref 2.0–18.0)

## 2016-05-09 ENCOUNTER — Encounter: Payer: Self-pay | Admitting: Family Medicine

## 2016-05-10 ENCOUNTER — Telehealth: Payer: Self-pay | Admitting: Internal Medicine

## 2016-05-10 ENCOUNTER — Encounter: Payer: Self-pay | Admitting: Family Medicine

## 2016-05-10 ENCOUNTER — Ambulatory Visit (INDEPENDENT_AMBULATORY_CARE_PROVIDER_SITE_OTHER): Payer: Medicare Other | Admitting: Family Medicine

## 2016-05-10 ENCOUNTER — Other Ambulatory Visit: Payer: Self-pay | Admitting: Family Medicine

## 2016-05-10 ENCOUNTER — Ambulatory Visit (INDEPENDENT_AMBULATORY_CARE_PROVIDER_SITE_OTHER): Payer: Medicare Other

## 2016-05-10 VITALS — BP 91/51 | HR 74 | Temp 97.0°F | Ht 67.0 in | Wt 155.0 lb

## 2016-05-10 DIAGNOSIS — J1000 Influenza due to other identified influenza virus with unspecified type of pneumonia: Secondary | ICD-10-CM

## 2016-05-10 DIAGNOSIS — R05 Cough: Secondary | ICD-10-CM

## 2016-05-10 DIAGNOSIS — R059 Cough, unspecified: Secondary | ICD-10-CM

## 2016-05-10 NOTE — Telephone Encounter (Signed)
Patient is returning phone call.  °

## 2016-05-10 NOTE — Progress Notes (Signed)
Subjective:  Patient ID: [name], male    DOB: 11/04/1932  Age: 83 y.o. MRN: [id number]  CC: Cough (pt here today c/o cough and is concerned he may be coming down with pneumonia)   HPI [name] presents for In today due to onset of cough 3 days ago. He described as a deep cough with heavy sputum that started out green. He has had some increase in his baseline shortness of breath. He does have a baseline of shortness of breath due to COPD. He brings in a list of temperatures taken over the last 3 days showing most of them in the normal range but one on the evening of March 11 that went to 100.2. Of note is that he was diagnosed 2 months ago with pneumonia and had it most of the month of January. He had some doxycycline leftover. So he started himself on that immediately. He says that he is now better. The sputum is no longer green it is become more of a light gray to clear. Of note is that he had a spot on his right lower lobe of his chest x-ray back in January CT was inconclusive and a follow-up CT is in the works.   History [name] has a past medical history of Abnormality of gait (06/07/2013); Anemia; Anginal pain ([place]); Arthritis; Asthma; Atrial fibrillation ([place]); Blood transfusion (june 2011, 5 units given); CAD (coronary artery disease) (03/2006); Cancer ([place]); Cataract; Complication of anesthesia; COPD (chronic obstructive pulmonary disease) ([place]); Depression; Diverticulosis; Dizziness; Emphysema; Enlarged prostate; Enlarged prostate with urinary retention; Esophageal stenosis; GERD (gastroesophageal reflux disease); Hiatal hernia; Hypothyroidism; Mass, scrotum; Neuropathy ([place]); Pituitary macroadenoma ([place]); Pneumonia; Restless legs syndrome (RLS); Transfusion history; UGIB (upper gastrointestinal bleed) (03/2013); UGIB (upper gastrointestinal bleed) (08/09/2009); and UGIB (upper gastrointestinal bleed) (09/2009).   He has a past surgical history that includes Coronary stent  placement (03/2006); Hiatal hernia repair (01-04-2008); Foot surgery (1994 left, 2002 right foot); Abdominal hernia repair ( 2008); cataract extraction both eyes; Total hip arthroplasty (07/21/2011); Cardiac catheterization (01/2007); Total hip arthroplasty (Right, 09/07/2012); Foot surgery; Esophagogastroduodenoscopy (N/A, 04/20/2013); Colonoscopy; Upper gastrointestinal endoscopy (12/2013); Cystoscopy with insertion of urolift (N/A, 06/01/2015); Cystoscopy (N/A, 06/10/2015); Prostate surgery; Esophagogastroduodenoscopy (egd) with propofol (N/A, 10/13/2015); Transurethral resection of prostate (N/A, 11/04/2015); Transurethral resection of bladder neck (N/A, 11/04/2015); Esophagogastroduodenoscopy (N/A, 03/25/2016); and Savory dilation (N/A, 03/25/2016).   His family history includes Heart disease in his sister; Heart failure in his father.He reports that he has never smoked. He has never used smokeless tobacco. He reports that he does not drink alcohol or use drugs.    ROS Review of Systems  Constitutional: Negative for chills, diaphoresis and fever.  HENT: Negative for rhinorrhea and sore throat.   Respiratory: Positive for cough and shortness of breath.   Cardiovascular: Negative for chest pain.  Gastrointestinal: Negative for abdominal pain.  Musculoskeletal: Negative for arthralgias and myalgias.  Skin: Negative for rash.  Neurological: Negative for weakness and headaches.    Objective:  BP (!) 91/51   Pulse 74   Temp 97 F (36.1 C) (Oral)   Ht 5\' 7"  (1.702 m)   Wt 155 lb (70.3 kg)   SpO2 97%   BMI 24.28 kg/m   BP Readings from Last 3 Encounters:  05/10/16 (!) 91/51  05/06/16 122/86  05/03/16 115/65    Wt Readings from Last 3 Encounters:  05/10/16 155 lb (70.3 kg)  05/06/16 154 lb (69.9 kg)  05/03/16 155 lb (70.3 kg)  Physical Exam  Constitutional: He is oriented to person, place, and time. He appears well-developed and well-nourished.  HENT:  Head: Normocephalic and  atraumatic.  Right Ear: Tympanic membrane and external ear normal. No decreased hearing is noted.  Left Ear: Tympanic membrane and external ear normal. No decreased hearing is noted.  Mouth/Throat: No oropharyngeal exudate or posterior oropharyngeal erythema.  Eyes: Pupils are equal, round, and reactive to light.  Neck: Normal range of motion. Neck supple.  Cardiovascular: Normal rate and regular rhythm.   No murmur heard. Pulmonary/Chest: Breath sounds normal. No respiratory distress.  Abdominal: Soft. Bowel sounds are normal. He exhibits no mass. There is no tenderness.  Neurological: He is alert and oriented to person, place, and time.  Skin: Skin is warm and dry.  Vitals reviewed.     Assessment & Plan:   [name] was seen today for cough.  Diagnoses and all orders for this visit:  Cough -     DG Chest 2 View; Future       I am having [name] maintain his guaiFENesin, cholecalciferol, ferrous sulfate, nitroGLYCERIN, vitamin E, bisacodyl, montelukast, diltiazem, calcium carbonate, vitamin B-12, magnesium oxide, naphazoline-pheniramine, sucralfate, pantoprazole, polyethylene glycol, SYMBICORT, albuterol, arformoterol, bromocriptine, and levothyroxine. We will continue to administer sodium chloride.  Allergies as of 05/10/2016      Reactions   Betadine [povidone Iodine] Other (See Comments)   Reaction:  Blisters    Lortab [hydrocodone-acetaminophen] Nausea And Vomiting   Penicillins Other (See Comments)   Reaction:  Lightheadedness  Has patient had a PCN reaction causing immediate rash, facial/tongue/throat swelling, SOB or lightheadedness with hypotension: Yes Has patient had a PCN reaction causing severe rash involving mucus membranes or skin necrosis: No Has patient had a PCN reaction that required hospitalization No Has patient had a PCN reaction occurring within the last 10 years: No If all of the above answers are "NO", then may proceed with Cephalosporin use.    Zetia [ezetimibe] Other (See Comments)   Reaction:  Muscle weakness    Zocor [simvastatin] Nausea Only, Other (See Comments)   Reaction:  Muscle weakness       Medication List       Accurate as of 05/10/16 11:59 PM. Always use your most recent med list.          albuterol 108 (90 Base) MCG/ACT inhaler Commonly known as:  PROVENTIL HFA;VENTOLIN HFA Inhale 2 puffs into the lungs every 6 (six) hours as needed for wheezing or shortness of breath.   arformoterol 15 MCG/2ML Nebu Commonly known as:  BROVANA Take 2 mLs (15 mcg total) by nebulization 2 (two) times daily as needed.   bisacodyl 10 MG suppository Commonly known as:  DULCOLAX Place 10 mg rectally as needed for moderate constipation.   bromocriptine 2.5 MG tablet Commonly known as:  PARLODEL TAKE  (1)  TABLET TWICE A DAY.   calcium carbonate 1500 (600 Ca) MG Tabs tablet Commonly known as:  OSCAL Take 600 mg of elemental calcium by mouth 2 (two) times daily with a meal.   cholecalciferol 1000 units tablet Commonly known as:  VITAMIN D Take 1,000 Units by mouth daily.   diltiazem 120 MG 24 hr capsule Commonly known as:  DILACOR XR Take 120 mg by mouth daily.   ferrous sulfate 325 (65 FE) MG tablet Take 325 mg by mouth daily with breakfast.   guaiFENesin 600 MG 12 hr tablet Commonly known as:  MUCINEX Take 600 mg by mouth 2 (two) times daily.  levothyroxine 50 MCG tablet Commonly known as:  SYNTHROID, LEVOTHROID TAKE 1 TABLET IN MORNING   magnesium oxide 400 (241.3 Mg) MG tablet Commonly known as:  MAG-OX Take 400 mg by mouth every other day.   montelukast 10 MG tablet Commonly known as:  SINGULAIR Take 10 mg by mouth at bedtime as needed (for allergies).   naphazoline-pheniramine 0.025-0.3 % ophthalmic solution Commonly known as:  NAPHCON-A Place 1 drop into both eyes 2 (two) times daily as needed for irritation or allergies.   nitroGLYCERIN 0.4 MG SL tablet Commonly known as:  NITROSTAT Place 0.4  mg under the tongue every 5 (five) minutes as needed for chest pain.   pantoprazole 40 MG tablet Commonly known as:  PROTONIX Take 1 tablet (40 mg total) by mouth 2 (two) times daily before a meal.   polyethylene glycol packet Commonly known as:  MIRALAX / GLYCOLAX Take 17 g by mouth daily.   sucralfate 1 g tablet Commonly known as:  CARAFATE Take 1 tablet (1 g total) by mouth 4 (four) times daily as needed (for GI upset).   SYMBICORT 160-4.5 MCG/ACT inhaler Generic drug:  budesonide-formoterol Inhale 2 puffs into the lungs 2 (two) times daily.   vitamin B-12 1000 MCG tablet Commonly known as:  CYANOCOBALAMIN Take 1,000 mcg by mouth daily.   vitamin E 400 UNIT capsule Generic drug:  vitamin E Take 400 Units by mouth daily.        Follow-up: No Follow-up on file.  Claretta Fraise, M.D.

## 2016-05-10 NOTE — Telephone Encounter (Signed)
Last ov 12.29.14 w/ MW MR did look at a CT per Dr. Timmothy Sours Moore's request 02/2016  LMOM TCB x1

## 2016-05-10 NOTE — Telephone Encounter (Signed)
lmtcb x1 for pt. 

## 2016-05-11 ENCOUNTER — Other Ambulatory Visit: Payer: Self-pay | Admitting: *Deleted

## 2016-05-11 ENCOUNTER — Ambulatory Visit (INDEPENDENT_AMBULATORY_CARE_PROVIDER_SITE_OTHER): Payer: Medicare Other

## 2016-05-11 ENCOUNTER — Encounter: Payer: Self-pay | Admitting: Family Medicine

## 2016-05-11 DIAGNOSIS — M542 Cervicalgia: Secondary | ICD-10-CM

## 2016-05-11 NOTE — Telephone Encounter (Signed)
Called and spoke to pt. Pt has concerns about his medication and has a cough. Advised pt that because it has been so long since he has been seen he will need an appt, pt verbalized understanding. Appt made with MW in 05/2016. Pt verbalized understanding and denied any further questions or concerns at this time.

## 2016-05-12 ENCOUNTER — Encounter: Payer: Self-pay | Admitting: Family Medicine

## 2016-05-12 ENCOUNTER — Other Ambulatory Visit: Payer: Self-pay | Admitting: *Deleted

## 2016-05-12 DIAGNOSIS — M5030 Other cervical disc degeneration, unspecified cervical region: Secondary | ICD-10-CM

## 2016-05-12 DIAGNOSIS — M542 Cervicalgia: Secondary | ICD-10-CM

## 2016-05-17 ENCOUNTER — Encounter: Payer: Self-pay | Admitting: Family Medicine

## 2016-05-18 ENCOUNTER — Encounter: Payer: Self-pay | Admitting: Family Medicine

## 2016-05-20 ENCOUNTER — Ambulatory Visit (HOSPITAL_COMMUNITY)
Admission: RE | Admit: 2016-05-20 | Discharge: 2016-05-20 | Disposition: A | Discharge status: Home / Self Care | Payer: Medicare Other | Source: Ambulatory Visit | Attending: Family Medicine | Admitting: Family Medicine

## 2016-05-20 DIAGNOSIS — J1000 Influenza due to other identified influenza virus with unspecified type of pneumonia: Secondary | ICD-10-CM

## 2016-05-20 DIAGNOSIS — I712 Thoracic aortic aneurysm, without rupture: Secondary | ICD-10-CM | POA: Insufficient documentation

## 2016-05-20 DIAGNOSIS — J09X1 Influenza due to identified novel influenza A virus with pneumonia: Secondary | ICD-10-CM | POA: Diagnosis present

## 2016-05-20 DIAGNOSIS — R918 Other nonspecific abnormal finding of lung field: Secondary | ICD-10-CM | POA: Insufficient documentation

## 2016-05-23 ENCOUNTER — Other Ambulatory Visit: Payer: Self-pay | Admitting: Endocrinology

## 2016-05-23 ENCOUNTER — Other Ambulatory Visit: Payer: Self-pay | Admitting: *Deleted

## 2016-05-23 DIAGNOSIS — R911 Solitary pulmonary nodule: Secondary | ICD-10-CM

## 2016-05-23 DIAGNOSIS — R9389 Abnormal findings on diagnostic imaging of other specified body structures: Secondary | ICD-10-CM

## 2016-05-23 DIAGNOSIS — I2510 Atherosclerotic heart disease of native coronary artery without angina pectoris: Secondary | ICD-10-CM

## 2016-05-23 DIAGNOSIS — I700 Atherosclerosis of aorta: Secondary | ICD-10-CM

## 2016-05-23 DIAGNOSIS — I2584 Coronary atherosclerosis due to calcified coronary lesion: Secondary | ICD-10-CM

## 2016-05-24 ENCOUNTER — Encounter: Payer: Self-pay | Admitting: Family Medicine

## 2016-05-24 DIAGNOSIS — G959 Disease of spinal cord, unspecified: Secondary | ICD-10-CM | POA: Diagnosis not present

## 2016-05-24 DIAGNOSIS — M542 Cervicalgia: Secondary | ICD-10-CM | POA: Diagnosis not present

## 2016-06-03 DIAGNOSIS — M542 Cervicalgia: Secondary | ICD-10-CM | POA: Diagnosis not present

## 2016-06-03 DIAGNOSIS — G959 Disease of spinal cord, unspecified: Secondary | ICD-10-CM | POA: Diagnosis not present

## 2016-06-07 ENCOUNTER — Encounter: Payer: Self-pay | Admitting: Family Medicine

## 2016-06-07 DIAGNOSIS — M542 Cervicalgia: Secondary | ICD-10-CM | POA: Diagnosis not present

## 2016-06-13 ENCOUNTER — Ambulatory Visit (INDEPENDENT_AMBULATORY_CARE_PROVIDER_SITE_OTHER): Payer: Medicare Other | Admitting: Internal Medicine

## 2016-06-13 ENCOUNTER — Ambulatory Visit (INDEPENDENT_AMBULATORY_CARE_PROVIDER_SITE_OTHER)
Admission: RE | Admit: 2016-06-13 | Discharge: 2016-06-13 | Disposition: A | Discharge status: Home / Self Care | Payer: Medicare Other | Source: Ambulatory Visit | Attending: Internal Medicine | Admitting: Internal Medicine

## 2016-06-13 ENCOUNTER — Encounter: Payer: Self-pay | Admitting: Internal Medicine

## 2016-06-13 VITALS — BP 110/70 | HR 66 | Ht 67.0 in | Wt 153.8 lb

## 2016-06-13 DIAGNOSIS — R9389 Abnormal findings on diagnostic imaging of other specified body structures: Secondary | ICD-10-CM

## 2016-06-13 DIAGNOSIS — J449 Chronic obstructive pulmonary disease, unspecified: Secondary | ICD-10-CM | POA: Diagnosis not present

## 2016-06-13 DIAGNOSIS — J189 Pneumonia, unspecified organism: Secondary | ICD-10-CM | POA: Diagnosis not present

## 2016-06-13 DIAGNOSIS — R938 Abnormal findings on diagnostic imaging of other specified body structures: Secondary | ICD-10-CM

## 2016-06-13 DIAGNOSIS — I719 Aortic aneurysm of unspecified site, without rupture: Secondary | ICD-10-CM | POA: Diagnosis not present

## 2016-06-13 MED ORDER — BUDESONIDE-FORMOTEROL FUMARATE 160-4.5 MCG/ACT IN AERO: 2.0000 | INHALATION_SPRAY | Freq: Two times a day (BID) | RESPIRATORY_TRACT | 0 refills | Status: DC

## 2016-06-13 MED ORDER — FLUTTER DEVI: 1.0000 | 0 refills | Status: DC | PRN

## 2016-06-13 NOTE — Progress Notes (Signed)
Subjective:    Patient ID: [name], male    DOB: [birth year]   MRN: [id number]    Brief patient profile:  83   yowm quit smoking completely around 1980 (relatively light) with cough and short of breath eval by  Dr Eliberto Ivory  Then [name] dx of copd/ cb  pft's showed fev1 116% 02/2010 though ratio 66 c/w GOLD I criteria    History of Present Illness  07/05/2010 ov cc recurrent pna's since June 2011 cc not back to baseline in terms of activities he enjoyed in May, for example  Could do some yardwork  and rarely  Needed saba and no need for any maint rx,   but on  spriva for sob and still frequent tightness generalized front more than back,  Equal both sides  low grade fever and mucus gets thick and yellow> admit to [facility] April 24-27 by [facility] dx of ? Pna.   No sob at rest.   Continue protonix 40 mg  Take 30-60 min before first meal of the day and Pepcid 20 mg at bedtime GERD (REFLUX) diet Try symbicort 160 Take 2 puffs bid and work on hfa technique  08/24/2010 ov/Wert/[name] cc breathing and cough better despite L rib fx 07/22/10. Main concern is how to prevent future exacerbations.  No limiting sob but not as active since quit smoking. Doing so much better overall rec  Ok to stop spiriva unless it reduces your desired activity performance (it's like high octane fuel for your lungs so your can be all you can be) Continue symbicort 160 Take 2 puffs first thing in am and then another 2 puffs about 12 hours later.    In event of recurrent signs of infection:  Change in sputum, fever, cough, short of breath or sore throat go ahead and take a course of doxycycline - if this doesn't help we need to see you in [place] (Wert/ [name]) F/u prn       07/12/2011 f/u ov/Wert cc dyspnea and cough "the best they've been in years"  here for preop eval for L hip on 23rd May by Alvan Dame,  used 2 rounds of abx since last ov for purulent sputum which cleared  and symbicort 2 bid and one mucinex and generally sleeping  well,  But since pollen season increased sub wheezing so started singulair > resolved.  rec Ok for surgery  Between now and then be sure to continue the singulair each day and Pepcid each night.    09/24/2012 f/u ov/Wert doxy 4/10  Chief Complaint  Patient presents with  . Follow-up    Pt states that overall his breathing is doing okay- he had hip replaced-rt on 09/07/12 and afterwards had some respiratory symptoms that are now clearing up taking doxycycline.Still has occ prod cough with light yellow sputum.    no singulair or pepcid at present, not on symbicort either, misunderstood instructions/ contingencies Was taking albuterol up to every 4 hours and now down to twice daily with relief of cough and sob  rec Plan A = automatic = symbicort 160 2puffs each am and protonix 40 mg Take 30-60 min before first meal of the day  For cough and congestion, mucinex dose = 1200 mg every 12 hours and add pain killer to the mucinex if needed For nasty mucus > doxy x 10 days For any respiratory symptom flare, take max dose of symbiocort 160 Take 2 puffs first thing in am and then another 2 puffs about  12 hours later.  and singulair and pepcid 20 mg at bedtime.  Only use your albuterol as a rescue medication to be used if you can't catch your breath       02/25/2013 f/u ov/Wert re: copd GOLD I  Chief Complaint  Patient presents with  . Follow-up    Pt states his breathing is stable.  Pt c/o chest congestion when laying down, SOB with activity and bending over.   No need for saba as long as taking symbiocort and only using it in am  Congestion at hs not using either symbicort or pepcid as part of the action plan as prev recommended No discolored sputum. Activities not limited by sob rec Whenever you aren't doing 100% better take symbicort 160 Take 2 puffs first thing in am and then another 2 puffs about 12 hours later.  And use the pepcid 20 mg one at a time and if that's not working, add back  singulair    06/13/2016  extended ov/Wert re: re establish re abn cxr  Chief Complaint  Patient presents with  . Pulm Consult    Former [facility] pt. Had a difficult round of PNA back in January 2018. Had 2 CTs performed and PCP noticed two suspicious spots on lungs. Was told to follow up with pulm as a precaution.   severe swallowing issues chronically and finally admitted Mar 25 2016 p food impaction with repeat EGD 05/03/16 still showing significant stricture requiring dilation but doing better now   Main symptom now is noct cough > gray mucus now sev hours p lying down but trending toward improvement  maint rx symbicort 160 2bid though looks like he was treated with brovana during admit and never taken off his med list   No obvious day to day or daytime variability or assoc excess/ purulent sputum or mucus plugs or hemoptysis or cp or chest tightness, subjective wheeze or overt sinus or hb symptoms. No unusual exp hx or h/o childhood pna/ asthma or knowledge of premature birth.  Sleeping ok without nocturnal  or early am exacerbation  of respiratory  c/o's or need for noct saba. Also denies any obvious fluctuation of symptoms with weather or environmental changes or other aggravating or alleviating factors except as outlined above   Current Medications, Allergies, Complete Past Medical History, Past Surgical History, Family History, and Social History were reviewed in [employer] record.  ROS  The following are not active complaints unless bolded sore throat, dysphagia, dental problems, itching, sneezing,  nasal congestion or excess/ purulent secretions, ear ache,   fever, chills, sweats, unintended wt loss, classically pleuritic or exertional cp,  orthopnea pnd or leg swelling, presyncope, palpitations, abdominal pain, anorexia, nausea, vomiting, diarrhea  or change in bowel or bladder habits, change in stools or urine, dysuria,hematuria,  rash, arthralgias, visual complaints,  headache, numbness, weakness or ataxia or problems with walking or coordination,  change in mood/affect or memory.                  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  Bonchiectasis RLL- CT 2009 > not verified on CT 07/22/10   Past Surgical History:  Inguinal herniorrhaphy  PTCA/stent  LEFT & RIGHT FOOT RECONSTRUCTION  hiatal hernia repair 2009   Family History:  neg for pituitary dz  mother died age 57 from phlebitis  father died age 73 from heart failure  8 siblings  alive age 66,80,89,92  1 died age 24 heart failure  3 died age 37,76,90 from heart problems    Social History:  married  2 children  retiired  never smoked  no etoh         Objective:   Physical Exam [place] amb wm nad wt 167 07/05/2010  > 168 08/24/2010 > 04/05/2011 174> 07/12/2011  176> 09/24/2012  169 > 02/25/2013  175   Wt Readings from Last 3 Encounters:  06/13/16 153 lb 12.8 oz (69.8 kg)  05/10/16 155 lb (70.3 kg)  05/06/16 154 lb (69.9 kg)    Vital signs reviewed  - Note on arrival 02 sats  96% on RA   HEENT: nl dentition, turbinates bilaterally, and oropharynx. Nl external ear canals without cough reflex   NECK :  without JVD/Nodes/TM/ nl carotid upstrokes bilaterally   LUNGS: no acc muscle use,  Nl contour chest with insp and exp rhonchi    CV:  RRR  no s3 or murmur or increase in P2, and no edema   ABD:  soft and nontender with nl inspiratory excursion in the supine position. No bruits or organomegaly  appreciated, bowel sounds nl  MS:  Nl gait/ ext warm without deformities, calf tenderness, cyanosis or clubbing No obvious joint restrictions   SKIN: warm and dry without lesions    NEURO:  alert, approp, nl sensorium with  no motor or cerebellar deficits apparent.       I personally reviewed images and agree with radiology impression as follows:  CT w/o contrast Chest  05/20/16 1. Some clearing of peripheral consolidation in the right lower lobe. There remains slight dilatation of right lower lobe bronchi with intraluminal material in the right lower lobe bronchi. There remains slightly nodular appearing infiltrates within the right lower lobe. Bronchoscopy may be considered for further evaluation as indicated. 2. Resolving inflammatory foci in the right middle lobe. 3. Stable 5 mm left lower lobe subpleural nodule. 4. Aneurysmal dilatation of the ascending aorta up to 4.4 cm.                  Assessment & Plan:

## 2016-06-13 NOTE — Patient Instructions (Addendum)
Plan A = Automatic = symbicort 160 Take 2 puffs first thing in am and then another 2 puffs about 12 hours later.     Work on maintaining   perfect inhaler technique:  relax and gently blow all the way out then take a nice smooth deep breath back in, triggering the inhaler at same time you start breathing in.  Hold for up to 5 seconds if you can. Blow out thru nose. Rinse and gargle with water when done      Plan B = Backup Only use your albuterol (PROAIR) as a rescue medication to be used if you can't catch your breath by resting or doing a relaxed purse lip breathing pattern.  - The less you use it, the better it will work when you need it. - Ok to use the inhaler up to 2 puffs  every 4 hours if you must but call for appointment if use goes up over your usual need - Minard't leave home without it !!  (think of it like the spare tire for your car)    Use the flutter valve as much as possible when coughing   Please remember to go to the x-ray department downstairs in the basement  for your tests - we will call you with the results when they are available.   If you are satisfied with your treatment plan,  let your doctor know and he/she can either refill your medications or you can return here when your prescription runs out.     If in any way you are not 100% satisfied,  please tell us.  If 100% better, tell your friends!  Pulmonary follow up is as needed

## 2016-06-13 NOTE — Progress Notes (Signed)
Spoke with pt and notified of results per Dr. Wert. Pt verbalized understanding and denied any questions. 

## 2016-06-14 ENCOUNTER — Encounter: Payer: Self-pay | Admitting: Internal Medicine

## 2016-06-14 NOTE — Telephone Encounter (Signed)
It's already in the symbicort (formoterol) so should not be taking brovana as well as would be duplication of same med

## 2016-06-14 NOTE — Telephone Encounter (Signed)
Please advise on patient e-mail Dr Melvyn Novas, I do not see this mentioned in last OV instructions, or I may be over looking it in the [facility] notes. Was the patient advised to Stop the Brovana?  ----- Message -----  From: [name]  Sent: 4/17/20189:37 AM EDT  To: [name], MD Subject: Non-Urgent Medical Question  At my OV with you yesterday morning I mentioned by experience relating to Arformoterol/Brovana nebulizer treatments of which seem to be detrimental. ThusI stopped the treatments. The treatments were started by the hospital staff back inApril. --As confirmation I believe you told me to remove Brovana from my med list, but I wish to be certain. Kind regards, [name]

## 2016-06-15 ENCOUNTER — Ambulatory Visit: Payer: Medicare Other | Admitting: Family Medicine

## 2016-06-15 NOTE — Assessment & Plan Note (Addendum)
Changes on CT scan are clearly related to chronic/recurrent aspiration which is related in turn to his esophageal stricture with no definite mass or concern for malignancy at this point. I think it would be reasonable to repeat a chest x-ray in 3 months and if there is convincing radiographic or clinical worsening I would be happy to see him back at any point here in the [place] office.  In the meantime to help him cough up material that he may have aspirated or causing concern with mucous plugging I recommended a combination of Mucinex and a flutter valve and instructed him how to use them appropriately.  Discussed in detail all the  indications, usual  risks and alternatives  relative to the benefits with patient who agrees to proceed with conservative f/u as outlined    Total time devoted to counseling  > 50 % of initial 45  min office visit:  review case/records/images  with pt/ discussion of options/alternatives/ personally creating written customized instructions  in presence of pt  then going over those specific  Instructions directly with the pt including how to use all of the meds but in particular covering each new medication in detail and the difference between the maintenance= "automatic" meds and the prns using an action plan format for the latter (If this problem/symptom => do that organization reading Left to right).  Please see AVS from this visit for a full list of these instructions which I personally wrote for this pt and  are unique to this visit.

## 2016-06-15 NOTE — Progress Notes (Signed)
This patient will need a chest x-ray in 3 months according to the note from Dr. Melvyn Novas. Please put a future order and for this and call the patient and let him know that this is what is requested by Dr. Melvyn Novas.

## 2016-06-15 NOTE — Assessment & Plan Note (Signed)
Followed in Pulmonary clinic/ [facility]/ Geralda Baumgardner prn    - Spirometry 03/30/2010 FEV1  2.86 (116%) ratio 66     - 06/13/2016  After extensive coaching HFA effectiveness =    75% from a baseline of 50%  He is back to baseline now despite suboptimal HFA technique and should do just fine on Symbicort 160 2bid as maint rx    I reviewed the Fletcher curve with the patient that basically indicates  if you quit smoking when your best day FEV1 is still well preserved (as was clearly  the case here)  it is highly unlikely you will progress to severe disease and informed the patient there was  no medication on the market that has proven to alter the curve/ its downward trajectory  or the likelihood of progression of their disease(unlike other chronic medical conditions such as atheroclerosis where we do think we can change the natural hx with risk reducing meds)    Therefore stopping smoking and maintaining abstinence is the most important aspect of care, not choice of inhalers or for that matter, doctors.    Pulmonary follow-up can therefore be when necessary.

## 2016-06-20 ENCOUNTER — Encounter: Payer: Self-pay | Admitting: Family Medicine

## 2016-06-20 ENCOUNTER — Encounter: Payer: Medicare Other | Admitting: Surgery

## 2016-06-20 ENCOUNTER — Ambulatory Visit (INDEPENDENT_AMBULATORY_CARE_PROVIDER_SITE_OTHER): Payer: Medicare Other | Admitting: Family Medicine

## 2016-06-20 VITALS — BP 102/57 | HR 57 | Temp 97.0°F | Ht 67.0 in | Wt 155.0 lb

## 2016-06-20 DIAGNOSIS — K222 Esophageal obstruction: Secondary | ICD-10-CM

## 2016-06-20 DIAGNOSIS — E538 Deficiency of other specified B group vitamins: Secondary | ICD-10-CM | POA: Diagnosis not present

## 2016-06-20 DIAGNOSIS — J42 Unspecified chronic bronchitis: Secondary | ICD-10-CM

## 2016-06-20 DIAGNOSIS — E559 Vitamin D deficiency, unspecified: Secondary | ICD-10-CM

## 2016-06-20 DIAGNOSIS — K219 Gastro-esophageal reflux disease without esophagitis: Secondary | ICD-10-CM | POA: Diagnosis not present

## 2016-06-20 DIAGNOSIS — E785 Hyperlipidemia, unspecified: Secondary | ICD-10-CM | POA: Diagnosis not present

## 2016-06-20 DIAGNOSIS — R938 Abnormal findings on diagnostic imaging of other specified body structures: Secondary | ICD-10-CM

## 2016-06-20 DIAGNOSIS — I712 Thoracic aortic aneurysm, without rupture: Secondary | ICD-10-CM | POA: Diagnosis not present

## 2016-06-20 DIAGNOSIS — I2510 Atherosclerotic heart disease of native coronary artery without angina pectoris: Secondary | ICD-10-CM

## 2016-06-20 DIAGNOSIS — R9389 Abnormal findings on diagnostic imaging of other specified body structures: Secondary | ICD-10-CM

## 2016-06-20 DIAGNOSIS — I7121 Aneurysm of the ascending aorta, without rupture: Secondary | ICD-10-CM

## 2016-06-20 DIAGNOSIS — E034 Atrophy of thyroid (acquired): Secondary | ICD-10-CM | POA: Diagnosis not present

## 2016-06-20 DIAGNOSIS — I4891 Unspecified atrial fibrillation: Secondary | ICD-10-CM

## 2016-06-20 NOTE — Patient Instructions (Addendum)
Medicare Annual Wellness Visit  [facility] and the medical providers at [facility] strive to bring you the best medical care.  In doing so we not only want to address your current medical conditions and concerns but also to detect new conditions early and prevent illness, disease and health-related problems.    Medicare offers a yearly Wellness Visit which allows our clinical staff to assess your need for preventative services including immunizations, lifestyle education, counseling to decrease risk of preventable diseases and screening for fall risk and other medical concerns.    This visit is provided free of charge (no copay) for all Medicare recipients. The clinical pharmacists at [facility] have begun to conduct these Wellness Visits which will also include a thorough review of all your medications.    As you primary medical provider recommend that you make an appointment for your Annual Wellness Visit if you have not done so already this year.  You may set up this appointment before you leave today or you may call back ([phone]) and schedule an appointment.  Please make sure when you call that you mention that you are scheduling your Annual Wellness Visit with the clinical pharmacist so that the appointment may be made for the proper length of time.     Continue current medications. Continue good therapeutic lifestyle changes which include good diet and exercise. Fall precautions discussed with patient. If an FOBT was given today- please return it to our front desk. If you are over 20 years old - you may need Prevnar 25 or the adult Pneumonia vaccine.  **Flu shots are available--- please call and schedule a FLU-CLINIC appointment**  After your visit with [place] today you will receive a survey in the mail or online from [employer] regarding your care with [place]. Please take a moment to fill this out. Your feedback is very  important to [place] as you can help [place] better understand your patient needs as well as improve your experience and satisfaction. WE CARE ABOUT YOU!!!   We will schedule you for an appointment with the cardiologist because of the chest discomfort you been having recently. The patient will call the podiatrist to have him look at the callus on his left heel to see if there are any other suggestions other than using an emery board at the present time The patient will follow-up with the vascular surgeon as planned a year from the previous visit We will get a CT scan of the chest again 6 months after the last one to follow-up on the pulmonary nodules and also on the aortic aneurysm The patient should follow-up with the gastroenterologist for his repeat endoscopy and stretching He should also follow-up with Dr. Maryjean Ka for that injection in his neck.

## 2016-06-20 NOTE — Progress Notes (Signed)
Subjective:    Patient ID: [name], male    DOB: 05-Sep-1932, 83 y.o.   MRN: [id number]  HPI Pt here for follow up and management of chronic medical problems which includes hyperlipidemia and hypothyroid. He is taking medication regularly.The patient is doing well overall. He has had several issues over the past several months for which she is being followed regularly by specialists. He is requesting a refill on the Symbicort. He is complaining of a callus on his left foot today and will get lab work today. He is scheduled for an endoscopy for an esophageal stricture and this is scheduled for May 8. He has a history of prolactinemia and is being followed by the endocrinologist for this. He has a thoracic aortic aneurysm and this is being followed regularly by Dr. Mohammed Kindle. His last chest CT without contrast was done on March 23 of this year. There was some clearing of the pulmonary congestion in the right lower lobe. There remains slight nodular appearing infiltrates within the right lower lobe. It says that bronchoscopy may be considered for further evaluation as indicated. This is from his CT scan. We will make sure the patient is aware of this. He is aware of the aneurysmal dilatation of the aorta. He has a stable 5 mm left lower lobe. We will discuss with him as I know he has seen a pulmonologist and make sure that Tylenol was just is aware of the abnormalities on his CT scan and that he has a follow-up with them. The patient currently does not have a return visit to see the pulmonologist he was told just to come back if he needed to see the pulmonologist. He does have a return visit to see the thoracic surgeon, Dr. Mohammed Kindle and that is in about 10 months or so. He is doing much better with his esophagus following the esophageal dilatation and stricture. He does have some discomfort in his chest which may last for as long as 20 minutes not associated with shortness of breath or nausea and not  necessarily associated with activity. Because of this we will schedule him to see the cardiologist on a visit when he comes to [place]. He otherwise denies any more shortness of breath than usual with his COPD. He's not had any blood in the stool or black tarry bowel movements and his swallowing is improved as noted. He's passing his water without problems. After reviewing the most recent CT scan, we will arrange for him to have another CT scan in about 6 months to follow-up on the pulmonary nodules and of course the aortic aneurysm will be assessed at that time also.     Patient Active Problem List   Diagnosis Date Noted  . Gastritis 05/04/2016  . Hiatal hernia 05/04/2016  . Abnormal chest x-ray 03/28/2016  . Esophageal stricture   . Non-intractable vomiting   . Abdominal pain, epigastric   . Dysphagia   . Atrial fibrillation with rapid ventricular response ([place])   . Elevated troponin   . Atrial fibrillation with RVR ([place]) 06/12/2015  . Pneumonia 06/12/2015  . Leukocytosis 06/12/2015  . Clot retention of urine 06/11/2015  . BPH (benign prostatic hypertrophy) with urinary obstruction 06/11/2015  . Postoperative anemia due to acute blood loss 06/11/2015  . Hematuria 06/10/2015  . Neck pain, bilateral 12/31/2013  . Neuropathy 12/31/2013  . Vitamin B 12 deficiency 12/31/2013  . Abnormality of gait 06/07/2013  . Esophageal ulcer with bleeding 04/20/2013  . S/P left  and ritght THA, AA 07/21/2011  . Special screening for malignant neoplasms, colon 01/03/2011  . Personal history of colonic polyps 01/03/2011  . COPD GOLD I 08/24/2010  . GI BLEED 11/17/2009  . ESOPHAGEAL REFLUX 08/25/2009  . BLOOD IN STOOL-MELENA 08/25/2009  . COLONIC POLYPS 07/17/2008  . ANEMIA 07/17/2008  . HYPOTENSION 07/17/2008  . Gastroesophageal reflux disease with esophagitis 07/17/2008  . DIVERTICULOSIS, MILD 07/17/2008  . DEGENERATIVE JOINT DISEASE 07/17/2008  . Osteoporosis 07/17/2008  . BPH (benign  prostatic hyperplasia) 07/17/2008  . Hyperprolactinemia ([place]) 10/24/2007  . PITUITARY ADENOMA 06/25/2007  . Hypothyroidism 06/25/2007  . Hyperlipidemia LDL goal <70 06/22/2007  . Coronary atherosclerosis 06/22/2007   Outpatient Encounter Prescriptions as of 06/20/2016  Medication Sig  . albuterol (PROVENTIL HFA;VENTOLIN HFA) 108 (90 Base) MCG/ACT inhaler Inhale 2 puffs into the lungs every 6 (six) hours as needed for wheezing or shortness of breath.  . bisacodyl (DULCOLAX) 10 MG suppository Place 10 mg rectally as needed for moderate constipation.  . bromocriptine (PARLODEL) 2.5 MG tablet TAKE  (1)  TABLET TWICE A DAY.  . budesonide-formoterol (SYMBICORT) 160-4.5 MCG/ACT inhaler Inhale 2 puffs into the lungs 2 (two) times daily.  . calcium carbonate (OSCAL) 1500 (600 Ca) MG TABS tablet Take 600 mg of elemental calcium by mouth 2 (two) times daily with a meal.   . cholecalciferol (VITAMIN D) 1000 UNITS tablet Take 1,000 Units by mouth daily.   Marland Kitchen diltiazem (DILACOR XR) 120 MG 24 hr capsule Take 120 mg by mouth daily.  . ferrous sulfate 325 (65 FE) MG tablet Take 325 mg by mouth daily with breakfast.   . guaiFENesin (MUCINEX) 600 MG 12 hr tablet Take 600 mg by mouth 2 (two) times daily.   Marland Kitchen levothyroxine (SYNTHROID, LEVOTHROID) 50 MCG tablet TAKE 1 TABLET IN MORNING  . magnesium oxide (MAG-OX) 400 (241.3 Mg) MG tablet Take 400 mg by mouth every other day.  . montelukast (SINGULAIR) 10 MG tablet Take 10 mg by mouth at bedtime as needed (for allergies).   . naphazoline-pheniramine (NAPHCON-A) 0.025-0.3 % ophthalmic solution Place 1 drop into both eyes 2 (two) times daily as needed for irritation or allergies.   . nitroGLYCERIN (NITROSTAT) 0.4 MG SL tablet Place 0.4 mg under the tongue every 5 (five) minutes as needed for chest pain.   . pantoprazole (PROTONIX) 40 MG tablet Take 1 tablet (40 mg total) by mouth 2 (two) times daily before a meal.  . polyethylene glycol (MIRALAX / GLYCOLAX) packet Take  17 g by mouth daily. (Patient taking differently: Take 17 g by mouth daily as needed for moderate constipation. )  . Respiratory Therapy Supplies (FLUTTER) DEVI 1 Device by Does not apply route as needed.  . sucralfate (CARAFATE) 1 g tablet Take 1 tablet (1 g total) by mouth 4 (four) times daily as needed (for GI upset).  . SYMBICORT 160-4.5 MCG/ACT inhaler Inhale 2 puffs into the lungs 2 (two) times daily.  . vitamin B-12 (CYANOCOBALAMIN) 1000 MCG tablet Take 1,000 mcg by mouth daily.  . vitamin E (VITAMIN E) 400 UNIT capsule Take 400 Units by mouth daily.  . [DISCONTINUED] arformoterol (BROVANA) 15 MCG/2ML NEBU Take 2 mLs (15 mcg total) by nebulization 2 (two) times daily as needed. (Patient taking differently: Take 15 mcg by nebulization 2 (two) times daily as needed (shortness of breath). )   Facility-Administered Encounter Medications as of 06/20/2016  Medication  . 0.9 %  sodium chloride infusion     Review of Systems  Constitutional:  Negative.   HENT: Negative.   Eyes: Negative.   Respiratory: Negative.   Cardiovascular: Negative.   Gastrointestinal: Negative.   Endocrine: Negative.   Genitourinary: Negative.   Musculoskeletal: Negative.   Skin: Negative.   Allergic/Immunologic: Negative.   Neurological: Negative.   Hematological: Negative.   Psychiatric/Behavioral: Negative.        Objective:   Physical Exam  Constitutional: He is oriented to person, place, and time. He appears well-developed and well-nourished. No distress.  The patient is alert and pleasant  HENT:  Head: Normocephalic and atraumatic.  Right Ear: External ear normal.  Left Ear: External ear normal.  Mouth/Throat: Oropharynx is clear and moist. No oropharyngeal exudate.  Slight nasal congestion  Eyes: Conjunctivae and EOM are normal. Pupils are equal, round, and reactive to light. Right eye exhibits no discharge. Left eye exhibits no discharge. No scleral icterus.  Neck: Normal range of motion. Neck  supple. No thyromegaly present.  No bruits thyromegaly or anterior cervical adenopathy  Cardiovascular: Normal rate, regular rhythm and normal heart sounds.   No murmur heard. The heart has a regular rate and rhythm at 60/m. Distal pulses were difficult to palpate.  Pulmonary/Chest: Effort normal. No respiratory distress. He has no wheezes. He has no rales. He exhibits no tenderness.  The patient has a dry but slightly congested cough. There were a few rhonchi with breathing. No wheezes.  Abdominal: Soft. Bowel sounds are normal. He exhibits no mass. There is no tenderness. There is no rebound and no guarding.  No abdominal tenderness masses or bruits or organ enlargement.  Musculoskeletal: Normal range of motion. He exhibits no edema.  Lymphadenopathy:    He has no cervical adenopathy.  Neurological: He is alert and oriented to person, place, and time. He has normal reflexes. No cranial nerve deficit.  Skin: Skin is warm and dry. No rash noted.  Psychiatric: He has a normal mood and affect. His behavior is normal. Judgment and thought content normal.  Nursing note and vitals reviewed.   BP (!) 102/57 (BP Location: Left Arm)   Pulse (!) 57   Temp 97 F (36.1 C) (Oral)   Ht '5\' 7"'$  (1.702 m)   Wt 155 lb (70.3 kg)   BMI 24.28 kg/m        Assessment & Plan:  1. Gastroesophageal reflux disease, esophagitis presence not specified -Patient will follow-up with gastroenterology as planned for an esophageal dilatation - CBC with Differential/Platelet - Hepatic function panel  2. Hypothyroidism due to acquired atrophy of thyroid -Continue current treatment pending results of lab work - CBC with Differential/Platelet - Thyroid Panel With TSH  3. Vitamin B 12 deficiency -Continue with current B12 treatment - CBC with Differential/Platelet  4. Vitamin D deficiency -Continue with vitamin D replacement pending results of lab work - CBC with Differential/Platelet - VITAMIN D 25 Hydroxy  (Vit-D Deficiency, Fractures)  5. ASCVD (arteriosclerotic cardiovascular disease) -The patient has had some recent chest discomfort that is probably not cardiac in nature but we will schedule him for a visit with the cardiologist for further evaluation - Lipid panel - BMP8+EGFR - CBC with Differential/Platelet  6. Hyperlipidemia LDL goal <70 -Continue with aggressive therapeutic lifestyle changes - Lipid panel - CBC with Differential/Platelet - Hepatic function panel  7. Thoracic ascending aortic aneurysm ([place]) -Follow-up with vascular surgery as planned  8. Atrial fibrillation with RVR ([place]) -The patient is in normal sinus rhythm today with a rate of about 60/m.  9. Abnormal chest x-ray -The  patient has a thoracic aortic aneurysm and he has pulmonary nodules. We will follow-up with a repeat chest CT in about 6 months hoping for stability with no further follow-ups with pulmonary.  10. Esophageal stricture -Follow-up with Dr. Elmo Putt as planned  28. Chronic bronchitis, unspecified chronic bronchitis type ([place]) -Continue to drink plenty of fluids and use inhalers as currently doing  Patient Instructions                       Medicare Annual Wellness Visit  [facility] and the medical providers at [facility] strive to bring you the best medical care.  In doing so we not only want to address your current medical conditions and concerns but also to detect new conditions early and prevent illness, disease and health-related problems.    Medicare offers a yearly Wellness Visit which allows our clinical staff to assess your need for preventative services including immunizations, lifestyle education, counseling to decrease risk of preventable diseases and screening for fall risk and other medical concerns.    This visit is provided free of charge (no copay) for all Medicare recipients. The clinical pharmacists at [facility] have begun to  conduct these Wellness Visits which will also include a thorough review of all your medications.    As you primary medical provider recommend that you make an appointment for your Annual Wellness Visit if you have not done so already this year.  You may set up this appointment before you leave today or you may call back ([phone]) and schedule an appointment.  Please make sure when you call that you mention that you are scheduling your Annual Wellness Visit with the clinical pharmacist so that the appointment may be made for the proper length of time.     Continue current medications. Continue good therapeutic lifestyle changes which include good diet and exercise. Fall precautions discussed with patient. If an FOBT was given today- please return it to our front desk. If you are over 74 years old - you may need Prevnar 69 or the adult Pneumonia vaccine.  **Flu shots are available--- please call and schedule a FLU-CLINIC appointment**  After your visit with [place] today you will receive a survey in the mail or online from [employer] regarding your care with [place]. Please take a moment to fill this out. Your feedback is very important to [place] as you can help [place] better understand your patient needs as well as improve your experience and satisfaction. WE CARE ABOUT YOU!!!   We will schedule you for an appointment with the cardiologist because of the chest discomfort you been having recently. The patient will call the podiatrist to have him look at the callus on his left heel to see if there are any other suggestions other than using an emery board at the present time The patient will follow-up with the vascular surgeon as planned a year from the previous visit We will get a CT scan of the chest again 6 months after the last one to follow-up on the pulmonary nodules and also on the aortic aneurysm The patient should follow-up with the gastroenterologist for his repeat endoscopy and stretching He should also  follow-up with Dr. Maryjean Ka for that injection in his neck.    Arrie Senate MD

## 2016-06-21 ENCOUNTER — Encounter: Payer: Self-pay | Admitting: Family Medicine

## 2016-06-21 LAB — THYROID PANEL WITH TSH
Free Thyroxine Index: 1.8 (ref 1.2–4.9)
T3 Uptake Ratio: 28 % (ref 24–39)
T4, Total: 6.5 ug/dL (ref 4.5–12.0)
TSH: 3.37 u[IU]/mL (ref 0.450–4.500)

## 2016-06-21 LAB — CBC WITH DIFFERENTIAL/PLATELET
Basophils Absolute: 0.1 10*3/uL (ref 0.0–0.2)
Basos: 2 %
EOS (ABSOLUTE): 0.2 10*3/uL (ref 0.0–0.4)
Eos: 2 %
Hematocrit: 36.7 % — ABNORMAL LOW (ref 37.5–51.0)
Hemoglobin: 11.9 g/dL — ABNORMAL LOW (ref 13.0–17.7)
Immature Grans (Abs): 0.2 10*3/uL — ABNORMAL HIGH (ref 0.0–0.1)
Immature Granulocytes: 2 %
Lymphocytes Absolute: 1.2 10*3/uL (ref 0.7–3.1)
Lymphs: 15 %
MCH: 30.7 pg (ref 26.6–33.0)
MCHC: 32.4 g/dL (ref 31.5–35.7)
MCV: 95 fL (ref 79–97)
Monocytes Absolute: 1.1 10*3/uL — ABNORMAL HIGH (ref 0.1–0.9)
Monocytes: 14 %
Neutrophils Absolute: 5.2 10*3/uL (ref 1.4–7.0)
Neutrophils: 65 %
Platelets: 226 10*3/uL (ref 150–379)
RBC: 3.88 x10E6/uL — ABNORMAL LOW (ref 4.14–5.80)
RDW: 15.7 % — ABNORMAL HIGH (ref 12.3–15.4)
WBC: 8 10*3/uL (ref 3.4–10.8)

## 2016-06-21 LAB — HEPATIC FUNCTION PANEL
ALT: 11 IU/L (ref 0–44)
AST: 21 IU/L (ref 0–40)
Albumin: 3.6 g/dL (ref 3.5–4.7)
Alkaline Phosphatase: 78 IU/L (ref 39–117)
Bilirubin Total: 0.5 mg/dL (ref 0.0–1.2)
Bilirubin, Direct: 0.11 mg/dL (ref 0.00–0.40)
Total Protein: 6.2 g/dL (ref 6.0–8.5)

## 2016-06-21 LAB — LIPID PANEL
Chol/HDL Ratio: 2.8 ratio (ref 0.0–5.0)
Cholesterol, Total: 160 mg/dL (ref 100–199)
HDL: 57 mg/dL (ref >39)
LDL Calculated: 90 mg/dL (ref 0–99)
Triglycerides: 67 mg/dL (ref 0–149)
VLDL Cholesterol Cal: 13 mg/dL (ref 5–40)

## 2016-06-21 LAB — BMP8+EGFR
BUN/Creatinine Ratio: 13 (ref 10–24)
BUN: 14 mg/dL (ref 8–27)
CO2: 24 mmol/L (ref 18–29)
Calcium: 8.6 mg/dL (ref 8.6–10.2)
Chloride: 105 mmol/L (ref 96–106)
Creatinine, Ser: 1.04 mg/dL (ref 0.76–1.27)
GFR calc Af Amer: 77 mL/min/{1.73_m2} (ref >59)
GFR calc non Af Amer: 67 mL/min/{1.73_m2} (ref >59)
Glucose: 83 mg/dL (ref 65–99)
Potassium: 4 mmol/L (ref 3.5–5.2)
Sodium: 142 mmol/L (ref 134–144)

## 2016-06-21 LAB — VITAMIN D 25 HYDROXY (VIT D DEFICIENCY, FRACTURES): Vit D, 25-Hydroxy: 34.1 ng/mL (ref 30.0–100.0)

## 2016-06-28 ENCOUNTER — Ambulatory Visit (AMBULATORY_SURGERY_CENTER): Payer: Self-pay

## 2016-06-28 ENCOUNTER — Encounter: Payer: Self-pay | Admitting: Internal Medicine

## 2016-06-28 VITALS — Ht 67.0 in | Wt 150.0 lb

## 2016-06-28 DIAGNOSIS — K222 Esophageal obstruction: Secondary | ICD-10-CM

## 2016-06-28 NOTE — Progress Notes (Signed)
No allergies to eggs or soy No past problems with anesthesia No diet meds no home oxygen  Declined emmi

## 2016-06-30 DIAGNOSIS — M542 Cervicalgia: Secondary | ICD-10-CM | POA: Diagnosis not present

## 2016-06-30 DIAGNOSIS — M47812 Spondylosis without myelopathy or radiculopathy, cervical region: Secondary | ICD-10-CM | POA: Diagnosis not present

## 2016-07-04 ENCOUNTER — Other Ambulatory Visit: Payer: Self-pay | Admitting: Endocrinology

## 2016-07-05 ENCOUNTER — Encounter: Payer: Self-pay | Admitting: Internal Medicine

## 2016-07-05 ENCOUNTER — Ambulatory Visit (AMBULATORY_SURGERY_CENTER): Payer: Medicare Other | Admitting: Internal Medicine

## 2016-07-05 VITALS — BP 125/75 | HR 60 | Temp 97.5°F | Resp 24 | Ht 67.0 in | Wt 150.0 lb

## 2016-07-05 DIAGNOSIS — K222 Esophageal obstruction: Secondary | ICD-10-CM

## 2016-07-05 MED ORDER — SODIUM CHLORIDE 0.9 % IV SOLN
500.0000 mL | INTRAVENOUS | Status: DC
Start: 2016-07-05 — End: 2017-01-02

## 2016-07-05 NOTE — Progress Notes (Signed)
Called to room to assist during endoscopic procedure.  Patient ID and intended procedure confirmed with present staff. Received instructions for my participation in the procedure from the performing physician.  

## 2016-07-05 NOTE — Progress Notes (Signed)
To PACU, VSS. Report to [email or web address] 

## 2016-07-05 NOTE — Patient Instructions (Addendum)
YOU HAD AN ENDOSCOPIC PROCEDURE TODAY AT [facility]:   Refer to the procedure report that was given to you for any specific questions about what was found during the examination.  If the procedure report does not answer your questions, please call your gastroenterologist to clarify.  If you requested that your care partner not be given the details of your procedure findings, then the procedure report has been included in a sealed envelope for you to review at your convenience later.  YOU SHOULD EXPECT: Some feelings of bloating in the abdomen. Passage of more gas than usual.  Walking can help get rid of the air that was put into your GI tract during the procedure and reduce the bloating. If you had a lower endoscopy (such as a colonoscopy or flexible sigmoidoscopy) you may notice spotting of blood in your stool or on the toilet paper. If you underwent a bowel prep for your procedure, you may not have a normal bowel movement for a few days.  Please Note:  You might notice some irritation and congestion in your nose or some drainage.  This is from the oxygen used during your procedure.  There is no need for concern and it should clear up in a day or so.  SYMPTOMS TO REPORT IMMEDIATELY:    Following upper endoscopy (EGD)  Vomiting of blood or coffee ground material  New chest pain or pain under the shoulder blades  Painful or persistently difficult swallowing  New shortness of breath  Fever of 100F or higher  Black, tarry-looking stools be  For urgent or emergent issues, a gastroenterologist can be reached at any hour by calling [phone].  Please read all handouts given to you by your recovery nurse. Continue current medications including twice daily pantoprazole and carafate 1gm before bedtime.   DIET:  Please follow post dilation diet sheet until the am then you may proceed to your regular diet.  Drink plenty of fluids but you should avoid alcoholic beverages for 24  hours.  ACTIVITY:  You should plan to take it easy for the rest of today and you should NOT DRIVE or use heavy machinery until tomorrow (because of the sedation medicines used during the test).    FOLLOW UP: Our staff will call the number listed on your records the next business day following your procedure to check on you and address any questions or concerns that you may have regarding the information given to you following your procedure. If we do not reach you, we will leave a message.  However, if you are feeling well and you are not experiencing any problems, there is no need to return our call.  We will assume that you have returned to your regular daily activities without incident.  If any biopsies were taken you will be contacted by phone or by letter within the next 1-3 weeks.  Please call us at [phone] if you have not heard about the biopsies in 3 weeks.    SIGNATURES/CONFIDENTIALITY: You and/or your care partner have signed paperwork which will be entered into your electronic medical record.  These signatures attest to the fact that that the information above on your After Visit Summary has been reviewed and is understood.  Full responsibility of the confidentiality of this discharge information lies with you and/or your care-partner.  Thank you for letting us take care of your healthcare needs today.

## 2016-07-05 NOTE — Op Note (Signed)
[facility] Patient Name: [name] Procedure Date: 07/05/2016 10:23 AM MRN: [id number] Endoscopist: Jerene Bears , MD Age: 83 Referring MD:  Date of Birth: 05/09/1932 Gender: Male Account #: [id number] Procedure:                Upper GI endoscopy Indications:              Follow-up of esophageal stricture, For therapy of                            esophageal stricture, last EGD March 2018, patient                            reports improvement since last dilation ("best it                            has been in a while") Medicines:                Monitored Anesthesia Care Procedure:                Pre-Anesthesia Assessment:                           - Prior to the procedure, a History and Physical                            was performed, and patient medications and                            allergies were reviewed. The patient's tolerance of                            previous anesthesia was also reviewed. The risks                            and benefits of the procedure and the sedation                            options and risks were discussed with the patient.                            All questions were answered, and informed consent                            was obtained. Prior Anticoagulants: The patient has                            taken no previous anticoagulant or antiplatelet                            agents. ASA Grade Assessment: III - A patient with                            severe systemic disease. After reviewing the risks  and benefits, the patient was deemed in                            satisfactory condition to undergo the procedure.                           After obtaining informed consent, the endoscope was                            passed under direct vision. Throughout the                            procedure, the patient's blood pressure, pulse, and                            oxygen saturations were monitored  continuously. The                            Endoscope was introduced through the mouth, and                            advanced to the second part of duodenum. The upper                            GI endoscopy was accomplished without difficulty.                            The patient tolerated the procedure well. Scope In: Scope Out: Findings:                 One moderate (circumferential scarring or stenosis;                            an endoscope may pass) benign-appearing, intrinsic                            stenosis was found 35 cm from the incisors. This                            measured 1 cm (in length) and was traversed. The                            stricture is stable and overall improved from                            earlier this year. A TTS dilator was passed through                            the scope. Dilation with a 13.5-14.5-15.5 mm                            balloon dilator was performed to 15.5 mm. The  dilation site was examined and showed mucosal rents                            in 3 locations around the stricture with                            self-limited heme and improvement in luminal                            narrowing. Estimated blood loss was minimal.                           A 5 cm hiatal hernia was present.                           The entire examined stomach was normal.                           The examined duodenum was normal. Complications:            No immediate complications. Estimated Blood Loss:     Estimated blood loss was minimal. Impression:               - Benign-appearing esophageal stenosis. Dilated to                            15.5 mm.                           - 5 cm hiatal hernia.                           - Normal stomach.                           - Normal examined duodenum.                           - No specimens collected. Recommendation:           - Patient has a contact number available for                             emergencies. The signs and symptoms of potential                            delayed complications were discussed with the                            patient. Return to normal activities tomorrow.                            Written discharge instructions were provided to the                            patient.                           -  Resume previous diet.                           - Continue present medications including twice                            daily pantoprazole and Carafate 1 g before meals                            and at bedtime as needed.                           - Office visit in 2-3 months.                           - Repeat EGD will likely be needed, but interval to                            be determined by symptoms. Will discuss at                            follow-up. Jerene Bears, MD 07/05/2016 10:45:40 AM This report has been signed electronically.

## 2016-07-06 ENCOUNTER — Telehealth: Payer: Self-pay

## 2016-07-06 DIAGNOSIS — H524 Presbyopia: Secondary | ICD-10-CM | POA: Diagnosis not present

## 2016-07-06 DIAGNOSIS — H04123 Dry eye syndrome of bilateral lacrimal glands: Secondary | ICD-10-CM | POA: Diagnosis not present

## 2016-07-06 DIAGNOSIS — H52223 Regular astigmatism, bilateral: Secondary | ICD-10-CM | POA: Diagnosis not present

## 2016-07-06 DIAGNOSIS — H5203 Hypermetropia, bilateral: Secondary | ICD-10-CM | POA: Diagnosis not present

## 2016-07-06 NOTE — Telephone Encounter (Signed)
  Follow up Call-  Call back number 07/05/2016 05/03/2016 01/27/2014  Post procedure Call Back phone  # [phone] hm  Permission to leave phone message Yes Yes Yes  Some recent data might be hidden     Patient questions:  Do you have a fever, pain , or abdominal swelling? No. Pain Score  0 *  Have you tolerated food without any problems? Yes.    Have you been able to return to your normal activities? Yes.    Do you have any questions about your discharge instructions: Diet   No. Medications  No. Follow up visit  No.  Do you have questions or concerns about your Care? No.  Actions: * If pain score is 4 or above: No action needed, pain <4. Pt verbalize can already tell a difference when swallowing larger pills.

## 2016-07-07 DIAGNOSIS — L821 Other seborrheic keratosis: Secondary | ICD-10-CM | POA: Diagnosis not present

## 2016-07-07 DIAGNOSIS — L814 Other melanin hyperpigmentation: Secondary | ICD-10-CM | POA: Diagnosis not present

## 2016-07-07 DIAGNOSIS — L570 Actinic keratosis: Secondary | ICD-10-CM | POA: Diagnosis not present

## 2016-07-07 DIAGNOSIS — L84 Corns and callosities: Secondary | ICD-10-CM | POA: Diagnosis not present

## 2016-07-07 DIAGNOSIS — Z85828 Personal history of other malignant neoplasm of skin: Secondary | ICD-10-CM | POA: Diagnosis not present

## 2016-07-18 ENCOUNTER — Encounter: Payer: Self-pay | Admitting: Internal Medicine

## 2016-07-20 ENCOUNTER — Encounter: Payer: Self-pay | Admitting: Family Medicine

## 2016-07-20 ENCOUNTER — Other Ambulatory Visit: Payer: Self-pay | Admitting: Endocrinology

## 2016-07-27 ENCOUNTER — Ambulatory Visit: Payer: Medicare Other | Admitting: Cardiology

## 2016-08-07 NOTE — Progress Notes (Signed)
HPI The patient presents for followup of his known coronary disease. He had a negative stress perfusion study prior to hip surgery in 2013. Since then he's had a bladder surgery.  He was in the hospital in April of last year with PAF.  He was not started on anticoagulation because of UGIB in the past and recent hematuria.  He did have an elevated troponin.  However, he had no evidence of ischemia on Lexiscan Myoview.  He returns for follow up.  He did have a CT recently and was noted to have mild increase in the size of his ascending aorta to 4.4 cm.  He saw Dr. Cyndia Bent and this will be followed.  He actually reports that he feels better than it has in a while. He wants to start getting back into some rehabilitation. He's starting to recover emotionally from his wife dying over a year ago.  Allergies  Allergen Reactions  . Betadine [Povidone Iodine] Other (See Comments)    Reaction:  Blisters   . Lortab [Hydrocodone-Acetaminophen] Nausea And Vomiting  . Penicillins Other (See Comments)    Reaction:  Lightheadedness  Has patient had a PCN reaction causing immediate rash, facial/tongue/throat swelling, SOB or lightheadedness with hypotension: Yes Has patient had a PCN reaction causing severe rash involving mucus membranes or skin necrosis: No Has patient had a PCN reaction that required hospitalization No Has patient had a PCN reaction occurring within the last 10 years: No If all of the above answers are "NO", then may proceed with Cephalosporin use.  Marland Kitchen Zetia [Ezetimibe] Other (See Comments)    Reaction:  Muscle weakness   . Zocor [Simvastatin] Nausea Only and Other (See Comments)    Reaction:  Muscle weakness     Current Outpatient Prescriptions  Medication Sig Dispense Refill  . albuterol (PROVENTIL HFA;VENTOLIN HFA) 108 (90 Base) MCG/ACT inhaler Inhale 2 puffs into the lungs every 6 (six) hours as needed for wheezing or shortness of breath. 18 g 11  . bisacodyl (DULCOLAX) 10 MG  suppository Place 10 mg rectally as needed for moderate constipation.    . bromocriptine (PARLODEL) 2.5 MG tablet Take 2.5 mg by mouth 2 (two) times daily.    . calcium carbonate (OSCAL) 1500 (600 Ca) MG TABS tablet Take 600 mg of elemental calcium by mouth daily with breakfast.     . cholecalciferol (VITAMIN D) 1000 UNITS tablet Take 1,000 Units by mouth daily.     Marland Kitchen diltiazem (DILACOR XR) 120 MG 24 hr capsule Take 120 mg by mouth daily.    . ferrous sulfate 325 (65 FE) MG tablet Take 325 mg by mouth daily with breakfast.     . guaiFENesin (MUCINEX) 600 MG 12 hr tablet Take 600 mg by mouth daily.     Marland Kitchen levothyroxine (SYNTHROID, LEVOTHROID) 50 MCG tablet TAKE 1 TABLET IN MORNING 30 tablet 0  . magnesium oxide (MAG-OX) 400 (241.3 Mg) MG tablet Take 400 mg by mouth every other day.    . montelukast (SINGULAIR) 10 MG tablet Take 10 mg by mouth at bedtime as needed (for allergies).     . naphazoline-pheniramine (NAPHCON-A) 0.025-0.3 % ophthalmic solution Place 1 drop into both eyes 2 (two) times daily as needed for irritation or allergies.     . nitroGLYCERIN (NITROSTAT) 0.4 MG SL tablet Place 0.4 mg under the tongue every 5 (five) minutes as needed for chest pain.     . pantoprazole (PROTONIX) 40 MG tablet Take 1 tablet (40 mg total) by  mouth 2 (two) times daily before a meal. 60 tablet 11  . polyethylene glycol (MIRALAX / GLYCOLAX) packet Take 17 g by mouth daily. (Patient taking differently: Take 17 g by mouth daily as needed for moderate constipation. ) 30 each 3  . sucralfate (CARAFATE) 1 g tablet Take 1 tablet (1 g total) by mouth 4 (four) times daily as needed (for GI upset). 120 tablet 3  . SYMBICORT 160-4.5 MCG/ACT inhaler Inhale 2 puffs into the lungs 2 (two) times daily. 10.2 g 5  . vitamin B-12 (CYANOCOBALAMIN) 1000 MCG tablet Take 1,000 mcg by mouth daily.    . vitamin E (VITAMIN E) 400 UNIT capsule Take 400 Units by mouth daily.    Marland Kitchen Respiratory Therapy Supplies (FLUTTER) DEVI 1 Device by  Does not apply route as needed. (Patient not taking: Reported on 07/05/2016) 1 each 0   Current Facility-Administered Medications  Medication Dose Route Frequency Provider Last Rate Last Dose  . 0.9 %  sodium chloride infusion  500 mL Intravenous Continuous Pyrtle, Lajuan Lines, MD      . 0.9 %  sodium chloride infusion  500 mL Intravenous Continuous Pyrtle, Lajuan Lines, MD        Past Medical History:  Diagnosis Date  . Abnormality of gait 06/07/2013  . Anemia   . Anginal pain (H[facility])    hx of   . Arthritis   . Asthma   . Atrial fibrillation ([place])   . Blood transfusion june 2011, 5 units given   july 2011 some units given  . CAD (coronary artery disease) 03/2006   3.0 x 20 mm TAXUS Perseus DES to the LAD; 01/2007  L main 30%, oLAD 50%, pLAD stent ok, CFX 80%, OM 60%, pRCA 60%, mRCA 70%, oPDA 90%; med rx   . Cancer ([place])    skin cancer   . Cataract    bilateral removal of cateracts  . Complication of anesthesia     no issues,but pt prefers spinal due to Pulmonary problems  . COPD (chronic obstructive pulmonary disease) ([place])    oxygen  on standby in home.  . Depression   . Diverticulosis   . Dizziness    occasional  . Emphysema   . Enlarged prostate    elevated psa recently  . Enlarged prostate with urinary retention   . Esophageal stenosis   . GERD (gastroesophageal reflux disease)   . Hiatal hernia   . Hypothyroidism   . Mass, scrotum    L scrotum - Sees Dr. Jeffie Pollock  . Neuropathy   . Pituitary macroadenoma ([place])   . Pneumonia    No recent.  Marland Kitchen Restless legs syndrome (RLS)   . Transfusion history    history 4 yrs ago.   Marland Kitchen UGIB (upper gastrointestinal bleed) 03/2013   EGD w/ large ulcer at GE junction  . UGIB (upper gastrointestinal bleed) 08/09/2009   At GE junction, rx w/ endoclips  . UGIB (upper gastrointestinal bleed) 09/2009   just above GE junction, rx  w/ 2 endoclips    Past Surgical History:  Procedure Laterality Date  . ABDOMINAL HERNIA REPAIR   2008  . CARDIAC  CATHETERIZATION  01/2007   L main 30%, oLAD 50%,  pLAD stent ok, CFX 80%, OM 60%, pRCA 60%, mRCA 70%, oPDA 90%; med rx  . cataract extraction both eyes    . COLONOSCOPY    . CORONARY STENT PLACEMENT  03/2006   3.0 x 20 mm TAXUS Perseus DES to the LAD  .  CYSTOSCOPY N/A 06/10/2015   Procedure: CYSTOSCOPY FULGRATION OF BLEEDING,  electovapor resection;  Surgeon: Irine Seal, MD;  Location: [facility];  Service: Urology;  Laterality: N/A;  . CYSTOSCOPY WITH INSERTION OF UROLIFT N/A 06/01/2015   Procedure: CYSTOSCOPY WITH INSERTION OF UROLIFT x4;  Surgeon: Irine Seal, MD;  Location: [facility];  Service: Urology;  Laterality: N/A;  . ESOPHAGOGASTRODUODENOSCOPY N/A 04/20/2013   Procedure: ESOPHAGOGASTRODUODENOSCOPY (EGD);  Surgeon: Inda Castle, MD;  Location: [facility];  Service: Endoscopy;  Laterality: N/A;  . ESOPHAGOGASTRODUODENOSCOPY N/A 03/25/2016   Procedure: ESOPHAGOGASTRODUODENOSCOPY (EGD);  Surgeon: Irene Shipper, MD;  Location: [facility];  Service: Endoscopy;  Laterality: N/A;  . ESOPHAGOGASTRODUODENOSCOPY (EGD) WITH PROPOFOL N/A 10/13/2015   Procedure: ESOPHAGOGASTRODUODENOSCOPY (EGD) WITH PROPOFOL;  Surgeon: Jerene Bears, MD;  Location: [facility];  Service: Gastroenterology;  Laterality: N/A;  . FOOT SURGERY  1994 left, 2002 right foot   bilateral foot reconstruciton  . FOOT SURGERY     reconstruction of both feet- no retained hardware.  Marland Kitchen HIATAL HERNIA REPAIR  01-04-2008  . PROSTATE SURGERY     x 2  . SAVORY DILATION N/A 03/25/2016   Procedure: SAVORY DILATION;  Surgeon: Irene Shipper, MD;  Location: [facility];  Service: Endoscopy;  Laterality: N/A;  . TOTAL HIP ARTHROPLASTY  07/21/2011   Procedure: TOTAL HIP ARTHROPLASTY ANTERIOR APPROACH;  Surgeon: Mauri Pole, MD;  Location: [facility];  Service: Orthopedics;  Laterality: Left;  . TOTAL HIP ARTHROPLASTY Right 09/07/2012   Procedure: RIGHT TOTAL HIP ARTHROPLASTY ANTERIOR APPROACH;  Surgeon: Mcarthur Rossetti, MD;  Location: [facility];  Service: Orthopedics;  Laterality: Right;  . TRANSURETHRAL RESECTION OF BLADDER NECK N/A 11/04/2015   Procedure: RESECTION OF BLADDER NECK;  Surgeon: Cleon Gustin, MD;  Location: [facility];  Service: Urology;  Laterality: N/A;  . TRANSURETHRAL RESECTION OF PROSTATE N/A 11/04/2015   Procedure: TRANSURETHRAL RESECTION OF THE PROSTATE (TURP); REMOVAL OF UROLIFT IMPLANTS X THREE;  Surgeon: Cleon Gustin, MD;  Location: [facility];  Service: Urology;  Laterality: N/A;  . UPPER GASTROINTESTINAL ENDOSCOPY  12/2013   Dr Hilarie Fredrickson, gastritis    ROS: Positive for urinary incontinence.Otherwise as stated in the HPI and negative for all other systems.  PHYSICAL EXAM BP 100/66   Pulse 65   Ht 5\' 7"  (1.702 m)   Wt 149 lb (67.6 kg)   BMI 23.34 kg/m   GENERAL:  Well appearing NECK:  No jugular venous distention, waveform within normal limits, carotid upstroke brisk and symmetric, no bruits, no thyromegaly LUNGS:  Clear to auscultation bilaterally HEART:  PMI not displaced or sustained,S1 and S2 within normal limits, no S3, no S4, no clicks, no rubs, no murmurs ABD:  Flat, positive bowel sounds normal in frequency in pitch, no bruits, no rebound, no guarding, no midline pulsatile mass, no hepatomegaly, no splenomegaly EXT:  2 plus pulses throughout, no edema, no cyanosis no clubbing  Lab Results  Component Value Date   CHOL 160 06/20/2016   TRIG 67 06/20/2016   HDL 57 06/20/2016   LDLCALC 90 06/20/2016   EKG:  Sinus rhythm, rate 65, axis within normal limits, intervals within normal limits, no acute ST-T wave changes  08/10/2016 area  ASSESSMENT AND PLAN  CORONARY ARTERY DISEASE -  The patient has no new sypmtoms.  No further cardiovascular testing is indicated.  We will continue with aggressive risk reduction and meds as listed.  ATRIAL FIB - He has not had any symptomatic recurrence of  this.  He would not be an anticoagulation candidate with his previous GI bleeds. No change in therapy is  planned.  HYPERLIPIDEMIA -  This is followed by Clois Dupes, MD.  Results are as above.  No change in therapy.   ASCENDING AORTIC ANEURYSM- This is 4.4 cm and will be followed in one year with CT.

## 2016-08-10 ENCOUNTER — Ambulatory Visit (INDEPENDENT_AMBULATORY_CARE_PROVIDER_SITE_OTHER): Payer: Medicare Other | Admitting: Cardiology

## 2016-08-10 ENCOUNTER — Encounter: Payer: Self-pay | Admitting: Cardiology

## 2016-08-10 VITALS — BP 100/66 | HR 65 | Ht 67.0 in | Wt 149.0 lb

## 2016-08-10 DIAGNOSIS — I480 Paroxysmal atrial fibrillation: Secondary | ICD-10-CM | POA: Diagnosis not present

## 2016-08-10 DIAGNOSIS — I7121 Aneurysm of the ascending aorta, without rupture: Secondary | ICD-10-CM | POA: Insufficient documentation

## 2016-08-10 DIAGNOSIS — E785 Hyperlipidemia, unspecified: Secondary | ICD-10-CM

## 2016-08-10 DIAGNOSIS — I2510 Atherosclerotic heart disease of native coronary artery without angina pectoris: Secondary | ICD-10-CM | POA: Diagnosis not present

## 2016-08-10 DIAGNOSIS — I712 Thoracic aortic aneurysm, without rupture: Secondary | ICD-10-CM | POA: Diagnosis not present

## 2016-08-10 NOTE — Patient Instructions (Signed)
Medication Instructions:  The current medical regimen is effective;  continue present plan and medications.  Follow-Up: Follow up in 1 year with Dr. Hochrein.  You will receive a letter in the mail 2 months before you are due.  Please call us when you receive this letter to schedule your follow up appointment.  If you need a refill on your cardiac medications before your next appointment, please call your pharmacy.  Thank you for choosing [facility]!!     

## 2016-08-22 ENCOUNTER — Other Ambulatory Visit: Payer: Self-pay

## 2016-08-22 ENCOUNTER — Other Ambulatory Visit: Payer: Self-pay | Admitting: Cardiology

## 2016-08-22 ENCOUNTER — Encounter: Payer: Self-pay | Admitting: Cardiology

## 2016-08-22 DIAGNOSIS — I2510 Atherosclerotic heart disease of native coronary artery without angina pectoris: Secondary | ICD-10-CM

## 2016-08-22 MED ORDER — NITROGLYCERIN 0.4 MG SL SUBL: 0.4000 mg | SUBLINGUAL_TABLET | SUBLINGUAL | 2 refills | Status: DC | PRN

## 2016-08-23 ENCOUNTER — Telehealth: Payer: Self-pay | Admitting: Cardiology

## 2016-08-23 NOTE — Telephone Encounter (Signed)
Spoke with pt, aware he can take NTG with an aneurysm.

## 2016-08-23 NOTE — Telephone Encounter (Signed)
Pt needs a call back about what he should do .... Should he take a nitro for his Aneurism?  Pt stated that question was not answered on the My Chart.

## 2016-09-07 ENCOUNTER — Other Ambulatory Visit: Payer: Self-pay | Admitting: Family Medicine

## 2016-09-12 ENCOUNTER — Encounter: Payer: Self-pay | Admitting: Internal Medicine

## 2016-09-12 ENCOUNTER — Ambulatory Visit (INDEPENDENT_AMBULATORY_CARE_PROVIDER_SITE_OTHER): Payer: Medicare Other | Admitting: Internal Medicine

## 2016-09-12 VITALS — BP 120/56 | HR 66 | Ht 66.0 in | Wt 150.0 lb

## 2016-09-12 DIAGNOSIS — K2100 Gastro-esophageal reflux disease with esophagitis, without bleeding: Secondary | ICD-10-CM

## 2016-09-12 DIAGNOSIS — K5909 Other constipation: Secondary | ICD-10-CM | POA: Diagnosis not present

## 2016-09-12 DIAGNOSIS — K449 Diaphragmatic hernia without obstruction or gangrene: Secondary | ICD-10-CM

## 2016-09-12 DIAGNOSIS — K222 Esophageal obstruction: Secondary | ICD-10-CM

## 2016-09-12 NOTE — Progress Notes (Signed)
Subjective:    Patient ID: [name], male    DOB: Dec 20, 1932, 83 y.o.   MRN: [id number]  HPI [name] is an 83 year old male with history of GERD with esophagitis, history of Barrett's esophagus, history of hiatal hernia recurrent after repair, dysphagia and esophageal stricture who is here for follow-up. He was last seen at the time of repeat upper endoscopy for dilation performed on 07/05/2016. He is here alone today. This EGD showed a moderate intrinsic stenosis 35 cm from the incisors. About a centimeter in length and was dilated to 15.5 with balloon. This showed mucosal rents in 3 locations with self-limited heme. There was improvement in luminal narrowing. Stable 5 cm hiatus hernia and a normal stomach and duodenum.  He reports that he is feeling now better than he has in sometime maybe over a year. He continues to take pantoprazole 40 mg at least daily occasionally twice daily. He will use sucralfate 3 times per day and occasionally 4 times per day. If he misses his morning PPI he will feel indigestion and heartburn by midafternoon. He is eating smaller meals and snacks and has had no further dysphagia. No odynophagia. No nausea or vomiting. No abdominal pain. No early satiety. Bowel movements occur on a cycle for him. Today he had a large movement and reports for the next several days he'll have a rather small bowel movement and then skip 1-2 days before the cycle starts over. He will use MiraLAX on occasion and takes a stool softener daily. He denies seeing blood in his stool or melena. He is drinking a boost throughout the day to help with protein and calorie supplementation. His weight has stabilized. He has been active using the treadmill. He is also met and become close with a long-term male friend and has very much enjoyed his companionship. His wife Inez [place]ina, died last 11/25/22 and he is thankful to have a close friend that he has a lot of common with.  He has a thoracic aortic  aneurysm which is being monitored. He saw Dr. Cyndia Bent with plans for annual follow-up for this issue. It is approximately 4.4 cm at last check.  Review of Systems As per history of present illness, otherwise negative  Current Medications, Allergies, Past Medical History, Past Surgical History, Family History and Social History were reviewed in [employer] record.     Objective:   Physical Exam BP (!) 120/56   Pulse 66   Ht '5\' 6"'$  (1.676 m)   Wt 150 lb (68 kg)   BMI 24.21 kg/m  Constitutional: Well-developed and well-nourished. No distress. HEENT: Normocephalic and atraumatic.Conjunctivae are normal.  No scleral icterus. Neck: Neck supple. Trachea midline. Cardiovascular: Normal rate, regular rhythm and intact distal pulses.  Pulmonary/chest: Effort normal and breath sounds normal. No wheezing, rales or rhonchi. Abdominal: Soft, nontender, nondistended. Bowel sounds active throughout.  Extremities: no clubbing, cyanosis, or edema Neurological: Alert and oriented to person place and time. Skin: Skin is warm and dry. Psychiatric: Normal mood and affect. Behavior is normal.      Assessment & Plan:  83 year old male with history of GERD with esophagitis, history of Barrett's esophagus, history of hiatal hernia recurrent after repair, dysphagia and esophageal stricture who is here for follow-up.  1. GERD with esophagitis/hiatal hernia/esophageal stricture -- dysphagia has resolved after dilation to 15.5 mm. His GERD symptoms are improved now that he has committed to daily PPI therapy. Carafate also helps him. I will have him continue pantoprazole  40 mg once to twice daily. Also Carafate 3-4 times daily before meals and at bedtime. Continue small more frequent meals and snacks avoiding overeating and eating late at night. If dysphagia returns would recommend repeat EGD for repeat dilation.  2. Chronic constipation -- mild overall doing well with this issue. Continue  MiraLAX on an as-needed basis and daily Colace  6 month follow-up, sooner if needed 25 minutes spent with the patient today. Greater than 50% was spent in counseling and coordination of care with the patient

## 2016-09-12 NOTE — Patient Instructions (Addendum)
Continue your current medications.  Please follow up with Dr Hilarie Fredrickson in 6 months.  If you are age 81 or older, your body mass index should be between 23-30. Your Body mass index is 24.21 kg/m. If this is out of the aforementioned range listed, please consider follow up with your Primary Care Provider.  If you are age 53 or younger, your body mass index should be between 19-25. Your Body mass index is 24.21 kg/m. If this is out of the aformentioned range listed, please consider follow up with your Primary Care Provider.

## 2016-09-14 ENCOUNTER — Encounter: Payer: Self-pay | Admitting: Endocrinology

## 2016-09-20 ENCOUNTER — Encounter: Payer: Self-pay | Admitting: Internal Medicine

## 2016-09-20 NOTE — Telephone Encounter (Signed)
Please see patient e-mail Dr Melvyn Novas. Thanks.

## 2016-09-27 ENCOUNTER — Other Ambulatory Visit: Payer: Self-pay | Admitting: *Deleted

## 2016-09-27 DIAGNOSIS — I729 Aneurysm of unspecified site: Secondary | ICD-10-CM

## 2016-09-28 ENCOUNTER — Ambulatory Visit (INDEPENDENT_AMBULATORY_CARE_PROVIDER_SITE_OTHER): Payer: Medicare Other | Admitting: Urology

## 2016-09-28 DIAGNOSIS — N401 Enlarged prostate with lower urinary tract symptoms: Secondary | ICD-10-CM

## 2016-10-06 ENCOUNTER — Other Ambulatory Visit: Payer: Self-pay | Admitting: Endocrinology

## 2016-10-26 ENCOUNTER — Encounter: Payer: Self-pay | Admitting: Family Medicine

## 2016-10-26 ENCOUNTER — Ambulatory Visit (INDEPENDENT_AMBULATORY_CARE_PROVIDER_SITE_OTHER): Payer: Medicare Other | Admitting: Family Medicine

## 2016-10-26 VITALS — BP 112/65 | HR 61 | Temp 96.8°F | Ht 66.0 in | Wt 151.0 lb

## 2016-10-26 DIAGNOSIS — E785 Hyperlipidemia, unspecified: Secondary | ICD-10-CM

## 2016-10-26 DIAGNOSIS — E034 Atrophy of thyroid (acquired): Secondary | ICD-10-CM

## 2016-10-26 DIAGNOSIS — I4891 Unspecified atrial fibrillation: Secondary | ICD-10-CM | POA: Diagnosis not present

## 2016-10-26 DIAGNOSIS — I2510 Atherosclerotic heart disease of native coronary artery without angina pectoris: Secondary | ICD-10-CM | POA: Diagnosis not present

## 2016-10-26 DIAGNOSIS — E559 Vitamin D deficiency, unspecified: Secondary | ICD-10-CM | POA: Diagnosis not present

## 2016-10-26 DIAGNOSIS — K219 Gastro-esophageal reflux disease without esophagitis: Secondary | ICD-10-CM | POA: Diagnosis not present

## 2016-10-26 DIAGNOSIS — I712 Thoracic aortic aneurysm, without rupture: Secondary | ICD-10-CM | POA: Diagnosis not present

## 2016-10-26 DIAGNOSIS — I7121 Aneurysm of the ascending aorta, without rupture: Secondary | ICD-10-CM

## 2016-10-26 NOTE — Progress Notes (Signed)
Subjective:    Patient ID: [name], male    DOB: 07/11/1932, 83 y.o.   MRN: [id number]  HPI Pt here for follow up and management of chronic medical problems which includes hyperlipidemia and hypothyroidism. The patient is doing well overall. He is followed regularly by the gastroenterologist the urologist and the pulmonologist. He is scheduled for a repeat chest CT in September of this year. He has a thoracic aortic aneurysm atherosclerotic heart disease COPD gastroesophageal reflux and BPH. His rectal exams are done by the urologist. He'll be given an FOBT to return and will get lab work today. His weight is 151 pounds with good vital signs. The patient in addition sees the cardiologist now every 12 months. The patient today denies any significant increase in chest pain. He denies any problems with shortness of breath. He denies any trouble with his stomach as long as he is taking his proton X regularly. He does take medicine for high replacements of his stools are dark colored. But he denies any blood in the stool. He takes MiraLAX and a stool softener for keeping his bowels moving regularly. He has his ongoing problems with BPH and sees Dr. Aline August regularly and may need an additional procedure to help him void better down the road. He says he's feeling the best now he is felt in a good while.    Patient Active Problem List   Diagnosis Date Noted  . Ascending aortic aneurysm ([place]) 08/10/2016  . Atherosclerosis of native coronary artery of native heart without angina pectoris 08/10/2016  . Gastritis 05/04/2016  . Hiatal hernia 05/04/2016  . Abnormal chest x-ray 03/28/2016  . Esophageal stricture   . Non-intractable vomiting   . Abdominal pain, epigastric   . Dysphagia   . Atrial fibrillation with rapid ventricular response ([place])   . Elevated troponin   . Atrial fibrillation with RVR ([place]) 06/12/2015  . Pneumonia 06/12/2015  . Leukocytosis 06/12/2015  . Clot retention of urine  06/11/2015  . BPH (benign prostatic hypertrophy) with urinary obstruction 06/11/2015  . Postoperative anemia due to acute blood loss 06/11/2015  . Hematuria 06/10/2015  . Neck pain, bilateral 12/31/2013  . Neuropathy 12/31/2013  . Vitamin B 12 deficiency 12/31/2013  . Abnormality of gait 06/07/2013  . Esophageal ulcer with bleeding 04/20/2013  . S/P left and ritght THA, AA 07/21/2011  . Special screening for malignant neoplasms, colon 01/03/2011  . Personal history of colonic polyps 01/03/2011  . COPD GOLD I 08/24/2010  . GI BLEED 11/17/2009  . ESOPHAGEAL REFLUX 08/25/2009  . BLOOD IN STOOL-MELENA 08/25/2009  . COLONIC POLYPS 07/17/2008  . ANEMIA 07/17/2008  . HYPOTENSION 07/17/2008  . Gastroesophageal reflux disease with esophagitis 07/17/2008  . DIVERTICULOSIS, MILD 07/17/2008  . DEGENERATIVE JOINT DISEASE 07/17/2008  . Osteoporosis 07/17/2008  . BPH (benign prostatic hyperplasia) 07/17/2008  . Hyperprolactinemia ([place]) 10/24/2007  . PITUITARY ADENOMA 06/25/2007  . Hypothyroidism 06/25/2007  . Hyperlipidemia LDL goal <70 06/22/2007  . Coronary atherosclerosis 06/22/2007   Outpatient Encounter Prescriptions as of 10/26/2016  Medication Sig  . albuterol (PROVENTIL HFA;VENTOLIN HFA) 108 (90 Base) MCG/ACT inhaler Inhale 2 puffs into the lungs every 6 (six) hours as needed for wheezing or shortness of breath.  . bisacodyl (DULCOLAX) 10 MG suppository Place 10 mg rectally as needed for moderate constipation.  . bromocriptine (PARLODEL) 2.5 MG tablet Take 2.5 mg by mouth 2 (two) times daily.  . calcium carbonate (OSCAL) 1500 (600 Ca) MG TABS tablet Take 600 mg of  elemental calcium by mouth daily with breakfast.   . cholecalciferol (VITAMIN D) 1000 UNITS tablet Take 1,000 Units by mouth daily.   Marland Kitchen diltiazem (DILACOR XR) 120 MG 24 hr capsule Take 120 mg by mouth daily.  . ferrous sulfate 325 (65 FE) MG tablet Take 325 mg by mouth daily with breakfast.   . guaiFENesin (MUCINEX) 600 MG 12  hr tablet Take 600 mg by mouth daily.   Marland Kitchen levothyroxine (SYNTHROID, LEVOTHROID) 50 MCG tablet TAKE 1 TABLET IN MORNING  . magnesium oxide (MAG-OX) 400 (241.3 Mg) MG tablet Take 400 mg by mouth every other day.  . montelukast (SINGULAIR) 10 MG tablet Take 10 mg by mouth at bedtime as needed (for allergies).   . naphazoline-pheniramine (NAPHCON-A) 0.025-0.3 % ophthalmic solution Place 1 drop into both eyes 2 (two) times daily as needed for irritation or allergies.   . nitroGLYCERIN (NITROSTAT) 0.4 MG SL tablet Place 1 tablet (0.4 mg total) under the tongue every 5 (five) minutes as needed for chest pain.  . pantoprazole (PROTONIX) 40 MG tablet Take 1 tablet (40 mg total) by mouth 2 (two) times daily before a meal.  . polyethylene glycol (MIRALAX / GLYCOLAX) packet Take 17 g by mouth daily. (Patient taking differently: Take 17 g by mouth daily as needed for moderate constipation. )  . Respiratory Therapy Supplies (FLUTTER) DEVI 1 Device by Does not apply route as needed.  . sucralfate (CARAFATE) 1 g tablet Take 1 tablet (1 g total) by mouth 4 (four) times daily as needed (for GI upset).  . SYMBICORT 160-4.5 MCG/ACT inhaler Inhale 2 puffs into the lungs 2 (two) times daily.  . vitamin B-12 (CYANOCOBALAMIN) 1000 MCG tablet Take 1,000 mcg by mouth daily.  . vitamin E (VITAMIN E) 400 UNIT capsule Take 400 Units by mouth daily.   Facility-Administered Encounter Medications as of 10/26/2016  Medication  . 0.9 %  sodium chloride infusion  . 0.9 %  sodium chloride infusion      Review of Systems  Constitutional: Negative.   HENT: Negative.   Eyes: Negative.   Respiratory: Negative.   Cardiovascular: Negative.   Gastrointestinal: Negative.   Endocrine: Negative.   Genitourinary: Negative.   Musculoskeletal: Negative.   Skin: Negative.   Allergic/Immunologic: Negative.   Neurological: Negative.   Hematological: Negative.   Psychiatric/Behavioral: Negative.        Objective:   Physical Exam    Constitutional: He is oriented to person, place, and time. He appears well-developed and well-nourished.  The patient is pleasant and alert and in good spirits. He has a good mind and seems to be capable of keeping up with all of his doctor's appointments and he is trying to get out more and be with people which I think is good for him.  HENT:  Head: Normocephalic and atraumatic.  Right Ear: External ear normal.  Left Ear: External ear normal.  Nose: Nose normal.  Mouth/Throat: Oropharynx is clear and moist. No oropharyngeal exudate.  Eyes: Pupils are equal, round, and reactive to light. Conjunctivae and EOM are normal. Right eye exhibits no discharge. Left eye exhibits no discharge. No scleral icterus.  Continue to get eye exams regularly  Neck: Normal range of motion. Neck supple. No thyromegaly present.  No thyromegaly bruits or anterior cervical adenopathy  Cardiovascular: Normal rate, regular rhythm, normal heart sounds and intact distal pulses.   No murmur heard. Heart is regular at 60/m  Pulmonary/Chest: Effort normal and breath sounds normal. No respiratory distress. He  has no wheezes. He has no rales. He exhibits no tenderness.  No axillary adenopathy Clear anteriorly and posteriorly with no wheezes or congestion noted  Abdominal: Soft. Bowel sounds are normal. He exhibits no mass. There is no tenderness. There is no rebound and no guarding.  No abdominal tenderness masses bruits or organ enlargement  Genitourinary:  Genitourinary Comments: The patient is followed regularly by Dr. Racheal Patches his urologist.  Musculoskeletal: Normal range of motion. He exhibits no edema or tenderness.  Fairly good mobility today. The patient is somewhat flat-footed with no arch.  Lymphadenopathy:    He has no cervical adenopathy.  Neurological: He is alert and oriented to person, place, and time. He has normal reflexes. No cranial nerve deficit.  Skin: Skin is warm and dry. No rash noted.   Psychiatric: He has a normal mood and affect. His behavior is normal. Judgment and thought content normal.  Nursing note and vitals reviewed.  BP 112/65 (BP Location: Left Arm)   Pulse 61   Temp (!) 96.8 F (36 C) (Oral)   Ht _0  (1.676 m)   Wt 151 lb (68.5 kg)   BMI 24.37 kg/m         Assessment & Plan:  1. Gastroesophageal reflux disease, esophagitis presence not specified -The patient will continue to take his proton X indefinitely - CBC with Differential/Platelet - Hepatic function panel  2. Hypothyroidism due to acquired atrophy of thyroid -Continue with current treatment pending results of lab work - CBC with Differential/Platelet - Thyroid Panel With TSH  3. Vitamin D deficiency -Continue with current treatment pending results of lab work - CBC with Differential/Platelet - VITAMIN D 25 Hydroxy (Vit-D Deficiency, Fractures)  4. ASCVD (arteriosclerotic cardiovascular disease) -No chest pain today and sees cardiology regularly. He did have normal sinus rhythm today on exam. - BMP8+EGFR - CBC with Differential/Platelet - Lipid panel  5. Hyperlipidemia LDL goal <70 -Continue current treatment pending results of lab work - BMP8+EGFR - CBC with Differential/Platelet - Lipid panel  6. Thoracic ascending aortic aneurysm [facility]) -Follow-up with vascular surgeon as planned - CBC with Differential/Platelet - Lipid panel  7. Atrial fibrillation with RVR ([place]) -Follow-up with cardiology as planned. Patient in normal sinus rhythm today. - CBC with Differential/Platelet  Patient Instructions                       Medicare Annual Wellness Visit  [facility] and the medical providers at [facility] strive to bring you the best medical care.  In doing so we not only want to address your current medical conditions and concerns but also to detect new conditions early and prevent illness, disease and health-related problems.    Medicare offers a  yearly Wellness Visit which allows our clinical staff to assess your need for preventative services including immunizations, lifestyle education, counseling to decrease risk of preventable diseases and screening for fall risk and other medical concerns.    This visit is provided free of charge (no copay) for all Medicare recipients. The clinical pharmacists at [facility] have begun to conduct these Wellness Visits which will also include a thorough review of all your medications.    As you primary medical provider recommend that you make an appointment for your Annual Wellness Visit if you have not done so already this year.  You may set up this appointment before you leave today or you may call back ([phone]) and schedule an appointment.  Please make sure when you call that you mention that you are scheduling your Annual Wellness Visit with the clinical pharmacist so that the appointment may be made for the proper length of time.    Continue current medications. Continue good therapeutic lifestyle changes which include good diet and exercise. Fall precautions discussed with patient. If an FOBT was given today- please return it to our front desk. If you are over 69 years old - you may need Prevnar 47 or the adult Pneumonia vaccine.  **Flu shots are available--- please call and schedule a FLU-CLINIC appointment**  After your visit with [place] today you will receive a survey in the mail or online from [employer] regarding your care with [place]. Please take a moment to fill this out. Your feedback is very important to [place] as you can help [place] better understand your patient needs as well as improve your experience and satisfaction. WE CARE ABOUT YOU!!!   Keep appointments with cardiology, urology, endocrinology, pulmonology, vascular surgery Nigel't forget to get a chest CT in September Try to drink more water and get more fluids on board. Return the FOBT card We will call with lab work  results as soon as those results become available Stay on proton X    Arrie Senate MD

## 2016-10-26 NOTE — Patient Instructions (Addendum)
Medicare Annual Wellness Visit  [facility] and the medical providers at [facility] strive to bring you the best medical care.  In doing so we not only want to address your current medical conditions and concerns but also to detect new conditions early and prevent illness, disease and health-related problems.    Medicare offers a yearly Wellness Visit which allows our clinical staff to assess your need for preventative services including immunizations, lifestyle education, counseling to decrease risk of preventable diseases and screening for fall risk and other medical concerns.    This visit is provided free of charge (no copay) for all Medicare recipients. The clinical pharmacists at [facility] have begun to conduct these Wellness Visits which will also include a thorough review of all your medications.    As you primary medical provider recommend that you make an appointment for your Annual Wellness Visit if you have not done so already this year.  You may set up this appointment before you leave today or you may call back ([phone]) and schedule an appointment.  Please make sure when you call that you mention that you are scheduling your Annual Wellness Visit with the clinical pharmacist so that the appointment may be made for the proper length of time.    Continue current medications. Continue good therapeutic lifestyle changes which include good diet and exercise. Fall precautions discussed with patient. If an FOBT was given today- please return it to our front desk. If you are over 31 years old - you may need Prevnar 38 or the adult Pneumonia vaccine.  **Flu shots are available--- please call and schedule a FLU-CLINIC appointment**  After your visit with [place] today you will receive a survey in the mail or online from [employer] regarding your care with [place]. Please take a moment to fill this out. Your feedback is very  important to [place] as you can help [place] better understand your patient needs as well as improve your experience and satisfaction. WE CARE ABOUT YOU!!!   Keep appointments with cardiology, urology, endocrinology, pulmonology, vascular surgery Arav't forget to get a chest CT in September Try to drink more water and get more fluids on board. Return the FOBT card We will call with lab work results as soon as those results become available Stay on proton X

## 2016-10-27 LAB — CBC WITH DIFFERENTIAL/PLATELET
Basophils Absolute: 0.1 10*3/uL (ref 0.0–0.2)
Basos: 1 %
EOS (ABSOLUTE): 0.3 10*3/uL (ref 0.0–0.4)
Eos: 4 %
Hematocrit: 35.9 % — ABNORMAL LOW (ref 37.5–51.0)
Hemoglobin: 12 g/dL — ABNORMAL LOW (ref 13.0–17.7)
Immature Grans (Abs): 0.3 10*3/uL — ABNORMAL HIGH (ref 0.0–0.1)
Immature Granulocytes: 4 %
Lymphocytes Absolute: 1.3 10*3/uL (ref 0.7–3.1)
Lymphs: 17 %
MCH: 31.9 pg (ref 26.6–33.0)
MCHC: 33.4 g/dL (ref 31.5–35.7)
MCV: 96 fL (ref 79–97)
Monocytes Absolute: 1.2 10*3/uL — ABNORMAL HIGH (ref 0.1–0.9)
Monocytes: 15 %
Neutrophils Absolute: 4.9 10*3/uL (ref 1.4–7.0)
Neutrophils: 59 %
Platelets: 221 10*3/uL (ref 150–379)
RBC: 3.76 x10E6/uL — ABNORMAL LOW (ref 4.14–5.80)
RDW: 14.9 % (ref 12.3–15.4)
WBC: 8.1 10*3/uL (ref 3.4–10.8)

## 2016-10-27 LAB — BMP8+EGFR
BUN/Creatinine Ratio: 16 (ref 10–24)
BUN: 14 mg/dL (ref 8–27)
CO2: 23 mmol/L (ref 20–29)
Calcium: 8.9 mg/dL (ref 8.6–10.2)
Chloride: 103 mmol/L (ref 96–106)
Creatinine, Ser: 0.9 mg/dL (ref 0.76–1.27)
GFR calc Af Amer: 92 mL/min/{1.73_m2} (ref >59)
GFR calc non Af Amer: 79 mL/min/{1.73_m2} (ref >59)
Glucose: 83 mg/dL (ref 65–99)
Potassium: 4.1 mmol/L (ref 3.5–5.2)
Sodium: 140 mmol/L (ref 134–144)

## 2016-10-27 LAB — HEPATIC FUNCTION PANEL
ALT: 9 IU/L (ref 0–44)
AST: 16 IU/L (ref 0–40)
Albumin: 3.6 g/dL (ref 3.5–4.7)
Alkaline Phosphatase: 77 IU/L (ref 39–117)
Bilirubin Total: 0.4 mg/dL (ref 0.0–1.2)
Bilirubin, Direct: 0.11 mg/dL (ref 0.00–0.40)
Total Protein: 6 g/dL (ref 6.0–8.5)

## 2016-10-27 LAB — THYROID PANEL WITH TSH
Free Thyroxine Index: 1.5 (ref 1.2–4.9)
T3 Uptake Ratio: 25 % (ref 24–39)
T4, Total: 6 ug/dL (ref 4.5–12.0)
TSH: 2.87 u[IU]/mL (ref 0.450–4.500)

## 2016-10-27 LAB — VITAMIN D 25 HYDROXY (VIT D DEFICIENCY, FRACTURES): Vit D, 25-Hydroxy: 39.3 ng/mL (ref 30.0–100.0)

## 2016-10-27 LAB — LIPID PANEL
Chol/HDL Ratio: 2.9 ratio (ref 0.0–5.0)
Cholesterol, Total: 166 mg/dL (ref 100–199)
HDL: 57 mg/dL (ref >39)
LDL Calculated: 98 mg/dL (ref 0–99)
Triglycerides: 53 mg/dL (ref 0–149)
VLDL Cholesterol Cal: 11 mg/dL (ref 5–40)

## 2016-10-28 ENCOUNTER — Other Ambulatory Visit: Payer: Medicare Other

## 2016-10-28 DIAGNOSIS — Z1211 Encounter for screening for malignant neoplasm of colon: Secondary | ICD-10-CM

## 2016-11-02 LAB — FECAL OCCULT BLOOD, IMMUNOCHEMICAL: Fecal Occult Bld: NEGATIVE

## 2016-11-08 ENCOUNTER — Other Ambulatory Visit: Payer: Self-pay | Admitting: Endocrinology

## 2016-11-21 ENCOUNTER — Ambulatory Visit (HOSPITAL_COMMUNITY)
Admission: RE | Admit: 2016-11-21 | Discharge: 2016-11-21 | Disposition: A | Discharge status: Home / Self Care | Payer: Medicare Other | Source: Ambulatory Visit | Attending: Family Medicine | Admitting: Family Medicine

## 2016-11-21 ENCOUNTER — Encounter: Payer: Self-pay | Admitting: Family Medicine

## 2016-11-21 DIAGNOSIS — I700 Atherosclerosis of aorta: Secondary | ICD-10-CM | POA: Insufficient documentation

## 2016-11-21 DIAGNOSIS — I729 Aneurysm of unspecified site: Secondary | ICD-10-CM | POA: Diagnosis not present

## 2016-11-21 DIAGNOSIS — R918 Other nonspecific abnormal finding of lung field: Secondary | ICD-10-CM | POA: Diagnosis not present

## 2016-12-14 ENCOUNTER — Other Ambulatory Visit: Payer: Self-pay | Admitting: Family Medicine

## 2016-12-14 ENCOUNTER — Other Ambulatory Visit: Payer: Self-pay | Admitting: Endocrinology

## 2016-12-26 ENCOUNTER — Ambulatory Visit (INDEPENDENT_AMBULATORY_CARE_PROVIDER_SITE_OTHER): Payer: Medicare Other

## 2016-12-26 DIAGNOSIS — Z23 Encounter for immunization: Secondary | ICD-10-CM | POA: Diagnosis not present

## 2017-01-01 ENCOUNTER — Encounter: Payer: Self-pay | Admitting: Family Medicine

## 2017-01-02 ENCOUNTER — Encounter: Payer: Self-pay | Admitting: Family Medicine

## 2017-01-02 ENCOUNTER — Ambulatory Visit (INDEPENDENT_AMBULATORY_CARE_PROVIDER_SITE_OTHER): Payer: Medicare Other

## 2017-01-02 ENCOUNTER — Ambulatory Visit (INDEPENDENT_AMBULATORY_CARE_PROVIDER_SITE_OTHER): Payer: Medicare Other | Admitting: Family Medicine

## 2017-01-02 VITALS — BP 115/68 | HR 77 | Temp 97.1°F | Ht 66.0 in | Wt 153.0 lb

## 2017-01-02 DIAGNOSIS — R05 Cough: Secondary | ICD-10-CM

## 2017-01-02 DIAGNOSIS — J988 Other specified respiratory disorders: Secondary | ICD-10-CM

## 2017-01-02 DIAGNOSIS — J189 Pneumonia, unspecified organism: Secondary | ICD-10-CM

## 2017-01-02 DIAGNOSIS — R059 Cough, unspecified: Secondary | ICD-10-CM

## 2017-01-02 DIAGNOSIS — J181 Lobar pneumonia, unspecified organism: Secondary | ICD-10-CM

## 2017-01-02 MED ORDER — LEVOFLOXACIN 500 MG PO TABS: 500.0000 mg | ORAL_TABLET | Freq: Every day | ORAL | 0 refills | Status: DC

## 2017-01-02 NOTE — Progress Notes (Signed)
Subjective:    Patient ID: [name], male    DOB: [birth year], 83 y.o.   MRN: [id number]  HPI  Patient here today for cough, congestion and fever yesterday.  The low-grade fever started yesterday.  Vital signs today are stable.  He started the doxycycline yesterday and has had 3 doses so far.  Patient denies any trouble with nausea vomiting diarrhea or trouble passing his water.    Patient Active Problem List   Diagnosis Date Noted  . Aortic atherosclerosis ([place]) 11/21/2016  . Ascending aortic aneurysm ([place]) 08/10/2016  . Atherosclerosis of native coronary artery of native heart without angina pectoris 08/10/2016  . Gastritis 05/04/2016  . Hiatal hernia 05/04/2016  . Abnormal chest x-ray 03/28/2016  . Esophageal stricture   . Non-intractable vomiting   . Abdominal pain, epigastric   . Dysphagia   . Atrial fibrillation with rapid ventricular response ([place])   . Elevated troponin   . Atrial fibrillation with RVR ([place]) 06/12/2015  . Pneumonia 06/12/2015  . Leukocytosis 06/12/2015  . Clot retention of urine 06/11/2015  . BPH (benign prostatic hypertrophy) with urinary obstruction 06/11/2015  . Postoperative anemia due to acute blood loss 06/11/2015  . Hematuria 06/10/2015  . Neck pain, bilateral 12/31/2013  . Neuropathy 12/31/2013  . Vitamin B 12 deficiency 12/31/2013  . Abnormality of gait 06/07/2013  . Esophageal ulcer with bleeding 04/20/2013  . S/P left and ritght THA, AA 07/21/2011  . Special screening for malignant neoplasms, colon 01/03/2011  . Personal history of colonic polyps 01/03/2011  . COPD GOLD I 08/24/2010  . GI BLEED 11/17/2009  . ESOPHAGEAL REFLUX 08/25/2009  . BLOOD IN STOOL-MELENA 08/25/2009  . COLONIC POLYPS 07/17/2008  . ANEMIA 07/17/2008  . HYPOTENSION 07/17/2008  . Gastroesophageal reflux disease with esophagitis 07/17/2008  . DIVERTICULOSIS, MILD 07/17/2008  . DEGENERATIVE JOINT DISEASE 07/17/2008  . Osteoporosis 07/17/2008  . BPH (benign  prostatic hyperplasia) 07/17/2008  . Hyperprolactinemia ([place]) 10/24/2007  . PITUITARY ADENOMA 06/25/2007  . Hypothyroidism 06/25/2007  . Hyperlipidemia LDL goal <70 06/22/2007  . Coronary atherosclerosis 06/22/2007   Outpatient Encounter Medications as of 01/02/2017  Medication Sig  . albuterol (PROVENTIL HFA;VENTOLIN HFA) 108 (90 Base) MCG/ACT inhaler Inhale 2 puffs into the lungs every 6 (six) hours as needed for wheezing or shortness of breath.  . bisacodyl (DULCOLAX) 10 MG suppository Place 10 mg rectally as needed for moderate constipation.  . bromocriptine (PARLODEL) 2.5 MG tablet Take 2.5 mg by mouth 2 (two) times daily.  . calcium carbonate (OSCAL) 1500 (600 Ca) MG TABS tablet Take 600 mg of elemental calcium by mouth daily with breakfast.   . cholecalciferol (VITAMIN D) 1000 UNITS tablet Take 1,000 Units by mouth daily.   Marland Kitchen diltiazem (CARDIZEM CD) 120 MG 24 hr capsule TAKE (1) CAPSULE DAILY  . ferrous sulfate 325 (65 FE) MG tablet Take 325 mg by mouth daily with breakfast.   . guaiFENesin (MUCINEX) 600 MG 12 hr tablet Take 600 mg by mouth daily.   Marland Kitchen levothyroxine (SYNTHROID, LEVOTHROID) 50 MCG tablet TAKE 1 TABLET IN MORNING  . magnesium oxide (MAG-OX) 400 (241.3 Mg) MG tablet Take 400 mg by mouth every other day.  . montelukast (SINGULAIR) 10 MG tablet Take 10 mg by mouth at bedtime as needed (for allergies).   . naphazoline-pheniramine (NAPHCON-A) 0.025-0.3 % ophthalmic solution Place 1 drop into both eyes 2 (two) times daily as needed for irritation or allergies.   . nitroGLYCERIN (NITROSTAT) 0.4 MG SL tablet Place 1  tablet (0.4 mg total) under the tongue every 5 (five) minutes as needed for chest pain.  . pantoprazole (PROTONIX) 40 MG tablet Take 1 tablet (40 mg total) by mouth 2 (two) times daily before a meal.  . polyethylene glycol (MIRALAX / GLYCOLAX) packet Take 17 g by mouth daily. (Patient taking differently: Take 17 g by mouth daily as needed for moderate constipation. )    . Respiratory Therapy Supplies (FLUTTER) DEVI 1 Device by Does not apply route as needed.  . sucralfate (CARAFATE) 1 g tablet Take 1 tablet (1 g total) by mouth 4 (four) times daily as needed (for GI upset).  . SYMBICORT 160-4.5 MCG/ACT inhaler Inhale 2 puffs into the lungs 2 (two) times daily.  . vitamin B-12 (CYANOCOBALAMIN) 1000 MCG tablet Take 1,000 mcg by mouth daily.  . vitamin E (VITAMIN E) 400 UNIT capsule Take 400 Units by mouth daily.  . [DISCONTINUED] diltiazem (DILACOR XR) 120 MG 24 hr capsule Take 120 mg by mouth daily.  . [DISCONTINUED] 0.9 %  sodium chloride infusion   . [DISCONTINUED] 0.9 %  sodium chloride infusion    No facility-administered encounter medications on file as of 01/02/2017.      Review of Systems  Constitutional: Negative.   HENT: Positive for congestion.   Eyes: Negative.   Respiratory: Positive for cough.   Cardiovascular: Negative.   Gastrointestinal: Negative.   Endocrine: Negative.   Genitourinary: Negative.   Musculoskeletal: Negative.   Skin: Negative.   Allergic/Immunologic: Negative.   Neurological: Negative.   Hematological: Negative.   Psychiatric/Behavioral: Negative.        Objective:   Physical Exam  Constitutional: He is oriented to person, place, and time. He appears well-developed and well-nourished. No distress.  Patient is pleasant and alert  HENT:  Head: Normocephalic and atraumatic.  Right Ear: External ear normal.  Left Ear: External ear normal.  Nose: Nose normal.  Mouth/Throat: Oropharynx is clear and moist. No oropharyngeal exudate.  Eyes: Conjunctivae and EOM are normal. Pupils are equal, round, and reactive to light. Right eye exhibits no discharge. Left eye exhibits no discharge. No scleral icterus.  Neck: Normal range of motion. Neck supple. No thyromegaly present.  No adenopathy or bruits  Cardiovascular: Normal rate, regular rhythm and normal heart sounds.  No murmur heard. Heart is regular at 72/min   Pulmonary/Chest: Effort normal. No respiratory distress. He has wheezes. He has no rales. He exhibits no tenderness.  Wheezes and rhonchi bilaterally left base posteriorly worse than right  Abdominal: Soft. Bowel sounds are normal. He exhibits distension. He exhibits no mass. There is no tenderness. There is no rebound and no guarding.  Gas and distention  Musculoskeletal: Normal range of motion. He exhibits no edema.  Lymphadenopathy:    He has no cervical adenopathy.  Neurological: He is alert and oriented to person, place, and time.  Skin: Skin is warm and dry. No rash noted.  Psychiatric: He has a normal mood and affect. His behavior is normal. Judgment and thought content normal.  Nursing note and vitals reviewed.   BP 115/68 (BP Location: Left Arm)   Pulse 77   Temp (!) 97.1 F (36.2 C) (Oral)   Ht '5\' 6"'$  (1.676 m)   Wt 153 lb (69.4 kg)   BMI 24.69 kg/m   Chest x-ray with results pending====     Assessment & Plan:  1. Cough -Take Mucinex for cough and congestion -Continue with Symbicort and use albuterol and nebulizer twice daily in between  that if needed regular albuterol inhaler if needed -Take antibiotic as directed -Call toward the end of the week and let us know what kind of progress your making and sooner if needed - DG Chest 2 View; Future - CBC with Differential/Platelet - BMP8+EGFR - Ova and parasite examination  2. Congestion of upper airway -Clear fluids and stay well hydrated - DG Chest 2 View; Future - CBC with Differential/Platelet - BMP8+EGFR - Ova and parasite examination  3. Community acquired pneumonia of left lower lobe of lung ([place]) -Chest x-ray with results pending -CBC and BMP with results pending  Meds ordered this encounter  Medications  . levofloxacin (LEVAQUIN) 500 MG tablet    Sig: Take 1 tablet (500 mg total) daily by mouth.    Dispense:  7 tablet    Refill:  0   Patient Instructions  Use nebulizer treatment once or twice daily  with albuterol Use rescue inhaler once or twice daily if needed along with albuterol If albuterol nebulizer makes her nauseated use more rescue inhaler Continue with Symbicort Take Levaquin 500 mg 1 daily for 7 days Call the nurse in a couple days to give [place] progress as to how you are feeling Drink plenty of fluids and stay well-hydrated Take Mucinex maximum strength 1 twice daily with a large glass of water for cough and congestion Return for recheck in 8-10 days Go to the emergency room if your condition worsens suddenly  Arrie Senate MD

## 2017-01-02 NOTE — Patient Instructions (Addendum)
Use nebulizer treatment once or twice daily with albuterol Use rescue inhaler once or twice daily if needed along with albuterol If albuterol nebulizer makes her nauseated use more rescue inhaler Continue with Symbicort Take Levaquin 500 mg 1 daily for 7 days Call the nurse in a couple days to give [place] progress as to how you are feeling Drink plenty of fluids and stay well-hydrated Take Mucinex maximum strength 1 twice daily with a large glass of water for cough and congestion Return for recheck in 8-10 days Go to the emergency room if your condition worsens suddenly

## 2017-01-03 LAB — CBC WITH DIFFERENTIAL/PLATELET
Basophils Absolute: 0.1 10*3/uL (ref 0.0–0.2)
Basos: 1 %
EOS (ABSOLUTE): 0.2 10*3/uL (ref 0.0–0.4)
Eos: 2 %
Hematocrit: 37 % — ABNORMAL LOW (ref 37.5–51.0)
Hemoglobin: 12 g/dL — ABNORMAL LOW (ref 13.0–17.7)
Immature Grans (Abs): 0.2 10*3/uL — ABNORMAL HIGH (ref 0.0–0.1)
Immature Granulocytes: 2 %
Lymphocytes Absolute: 1.2 10*3/uL (ref 0.7–3.1)
Lymphs: 12 %
MCH: 31.1 pg (ref 26.6–33.0)
MCHC: 32.4 g/dL (ref 31.5–35.7)
MCV: 96 fL (ref 79–97)
Monocytes Absolute: 1.5 10*3/uL — ABNORMAL HIGH (ref 0.1–0.9)
Monocytes: 15 %
Neutrophils Absolute: 7 10*3/uL (ref 1.4–7.0)
Neutrophils: 68 %
Platelets: 238 10*3/uL (ref 150–379)
RBC: 3.86 x10E6/uL — ABNORMAL LOW (ref 4.14–5.80)
RDW: 14.4 % (ref 12.3–15.4)
WBC: 10.1 10*3/uL (ref 3.4–10.8)

## 2017-01-03 LAB — BMP8+EGFR
BUN/Creatinine Ratio: 14 (ref 10–24)
BUN: 12 mg/dL (ref 8–27)
CO2: 21 mmol/L (ref 20–29)
Calcium: 8.5 mg/dL — ABNORMAL LOW (ref 8.6–10.2)
Chloride: 109 mmol/L — ABNORMAL HIGH (ref 96–106)
Creatinine, Ser: 0.87 mg/dL (ref 0.76–1.27)
GFR calc Af Amer: 93 mL/min/{1.73_m2} (ref >59)
GFR calc non Af Amer: 80 mL/min/{1.73_m2} (ref >59)
Glucose: 102 mg/dL — ABNORMAL HIGH (ref 65–99)
Potassium: 3.7 mmol/L (ref 3.5–5.2)
Sodium: 145 mmol/L — ABNORMAL HIGH (ref 134–144)

## 2017-01-05 ENCOUNTER — Other Ambulatory Visit: Payer: Self-pay | Admitting: Internal Medicine

## 2017-01-05 ENCOUNTER — Other Ambulatory Visit: Payer: Self-pay | Admitting: Family Medicine

## 2017-01-05 ENCOUNTER — Other Ambulatory Visit: Payer: Self-pay | Admitting: Endocrinology

## 2017-01-11 ENCOUNTER — Encounter: Payer: Self-pay | Admitting: Family Medicine

## 2017-01-11 ENCOUNTER — Ambulatory Visit (INDEPENDENT_AMBULATORY_CARE_PROVIDER_SITE_OTHER): Payer: Medicare Other | Admitting: Family Medicine

## 2017-01-11 VITALS — BP 123/64 | HR 61 | Temp 96.8°F | Ht 66.0 in | Wt 153.0 lb

## 2017-01-11 DIAGNOSIS — I712 Thoracic aortic aneurysm, without rupture: Secondary | ICD-10-CM | POA: Diagnosis not present

## 2017-01-11 DIAGNOSIS — I7121 Aneurysm of the ascending aorta, without rupture: Secondary | ICD-10-CM

## 2017-01-11 DIAGNOSIS — J181 Lobar pneumonia, unspecified organism: Secondary | ICD-10-CM | POA: Diagnosis not present

## 2017-01-11 DIAGNOSIS — I700 Atherosclerosis of aorta: Secondary | ICD-10-CM | POA: Diagnosis not present

## 2017-01-11 DIAGNOSIS — J189 Pneumonia, unspecified organism: Secondary | ICD-10-CM

## 2017-01-11 NOTE — Patient Instructions (Signed)
The patient will continue with his Mucinex twice daily his inhalers regularly drinking plenty of fluids but staying well-hydrated and staying out of crowds of people. He will take a course of doxycycline for 7 days which she already has at home in stock twice daily with food if he is not better in the next 3-4 days. He should call us back if his condition worsens.

## 2017-01-11 NOTE — Progress Notes (Signed)
Subjective:    Patient ID: [name], male    DOB: 09/07/1932, 83 y.o.   MRN: [id number]  HPI Patient here today for 10 day follow up on cough and congestion. He completed antibiotic Monday and felt better until yesterday.  The patient has been on a 7-day course of Levaquin.  Last night he has had more congestion and it is thick and gray in color.  He is not running any fever.  Despite physical findings the previous x-ray did not show any active lung disease or infiltrate.  Everything was stable at that time.  The CBC from the previous visit had a hemoglobin that was 12.0 and a white blood cell count that was within normal limits.  She is positive and feeling much better.  He unfortunately has not been using his Symbicort but once daily on most occasions and encouraged him to try to do better with using it twice daily as he is supposed to be and using his rescue inhaler as needed.     Patient Active Problem List   Diagnosis Date Noted  . Aortic atherosclerosis ([place]) 11/21/2016  . Ascending aortic aneurysm ([place]) 08/10/2016  . Atherosclerosis of native coronary artery of native heart without angina pectoris 08/10/2016  . Gastritis 05/04/2016  . Hiatal hernia 05/04/2016  . Abnormal chest x-ray 03/28/2016  . Esophageal stricture   . Non-intractable vomiting   . Abdominal pain, epigastric   . Dysphagia   . Atrial fibrillation with rapid ventricular response ([place])   . Elevated troponin   . Atrial fibrillation with RVR ([place]) 06/12/2015  . Pneumonia 06/12/2015  . Leukocytosis 06/12/2015  . Clot retention of urine 06/11/2015  . BPH (benign prostatic hypertrophy) with urinary obstruction 06/11/2015  . Postoperative anemia due to acute blood loss 06/11/2015  . Hematuria 06/10/2015  . Neck pain, bilateral 12/31/2013  . Neuropathy 12/31/2013  . Vitamin B 12 deficiency 12/31/2013  . Abnormality of gait 06/07/2013  . Esophageal ulcer with bleeding 04/20/2013  . S/P left and ritght THA,  AA 07/21/2011  . Special screening for malignant neoplasms, colon 01/03/2011  . Personal history of colonic polyps 01/03/2011  . COPD GOLD I 08/24/2010  . GI BLEED 11/17/2009  . ESOPHAGEAL REFLUX 08/25/2009  . BLOOD IN STOOL-MELENA 08/25/2009  . COLONIC POLYPS 07/17/2008  . ANEMIA 07/17/2008  . HYPOTENSION 07/17/2008  . Gastroesophageal reflux disease with esophagitis 07/17/2008  . DIVERTICULOSIS, MILD 07/17/2008  . DEGENERATIVE JOINT DISEASE 07/17/2008  . Osteoporosis 07/17/2008  . BPH (benign prostatic hyperplasia) 07/17/2008  . Hyperprolactinemia ([place]) 10/24/2007  . PITUITARY ADENOMA 06/25/2007  . Hypothyroidism 06/25/2007  . Hyperlipidemia LDL goal <70 06/22/2007  . Coronary atherosclerosis 06/22/2007   Outpatient Encounter Medications as of 01/11/2017  Medication Sig  . albuterol (PROVENTIL HFA;VENTOLIN HFA) 108 (90 Base) MCG/ACT inhaler Inhale 2 puffs into the lungs every 6 (six) hours as needed for wheezing or shortness of breath.  . bisacodyl (DULCOLAX) 10 MG suppository Place 10 mg rectally as needed for moderate constipation.  . bromocriptine (PARLODEL) 2.5 MG tablet Take 2.5 mg by mouth 2 (two) times daily.  . bromocriptine (PARLODEL) 2.5 MG tablet TAKE  (1)  TABLET TWICE A DAY.  . calcium carbonate (OSCAL) 1500 (600 Ca) MG TABS tablet Take 600 mg of elemental calcium by mouth daily with breakfast.   . cholecalciferol (VITAMIN D) 1000 UNITS tablet Take 1,000 Units by mouth daily.   Marland Kitchen diltiazem (CARDIZEM CD) 120 MG 24 hr capsule TAKE (1) CAPSULE DAILY  .  ferrous sulfate 325 (65 FE) MG tablet Take 325 mg by mouth daily with breakfast.   . guaiFENesin (MUCINEX) 600 MG 12 hr tablet Take 600 mg by mouth daily.   Marland Kitchen levothyroxine (SYNTHROID, LEVOTHROID) 50 MCG tablet TAKE 1 TABLET IN MORNING  . magnesium oxide (MAG-OX) 400 (241.3 Mg) MG tablet Take 400 mg by mouth every other day.  . montelukast (SINGULAIR) 10 MG tablet Take 10 mg by mouth at bedtime as needed (for allergies).     . naphazoline-pheniramine (NAPHCON-A) 0.025-0.3 % ophthalmic solution Place 1 drop into both eyes 2 (two) times daily as needed for irritation or allergies.   . nitroGLYCERIN (NITROSTAT) 0.4 MG SL tablet Place 1 tablet (0.4 mg total) under the tongue every 5 (five) minutes as needed for chest pain.  . pantoprazole (PROTONIX) 40 MG tablet TAKE (1) TABLET TWICE A DAY BEFORE MEALS.  Marland Kitchen polyethylene glycol (MIRALAX / GLYCOLAX) packet Take 17 g by mouth daily. (Patient taking differently: Take 17 g by mouth daily as needed for moderate constipation. )  . Respiratory Therapy Supplies (FLUTTER) DEVI 1 Device by Does not apply route as needed.  . sucralfate (CARAFATE) 1 g tablet Take 1 tablet (1 g total) by mouth 4 (four) times daily as needed (for GI upset).  . SYMBICORT 160-4.5 MCG/ACT inhaler Inhale 2 puffs into the lungs 2 (two) times daily.  . vitamin B-12 (CYANOCOBALAMIN) 1000 MCG tablet Take 1,000 mcg by mouth daily.  . vitamin E (VITAMIN E) 400 UNIT capsule Take 400 Units by mouth daily.  . [DISCONTINUED] levofloxacin (LEVAQUIN) 500 MG tablet Take 1 tablet (500 mg total) daily by mouth.   No facility-administered encounter medications on file as of 01/11/2017.      Review of Systems  Constitutional: Negative.   HENT: Positive for congestion (thick and grey).   Eyes: Negative.   Respiratory: Negative.  Wheezing: better.   Cardiovascular: Negative.   Gastrointestinal: Negative.   Endocrine: Negative.   Genitourinary: Negative.   Musculoskeletal: Negative.   Skin: Negative.   Allergic/Immunologic: Negative.   Neurological: Negative.   Hematological: Negative.   Psychiatric/Behavioral: Negative.        Objective:   Physical Exam  Constitutional: He is oriented to person, place, and time. He appears well-developed and well-nourished. No distress.  The patient is alert and positive and is actively trying to get better other than not using his Symbicort regularly.  HENT:  Head:  Normocephalic and atraumatic.  Right Ear: External ear normal.  Left Ear: External ear normal.  Nose: Nose normal.  Mouth/Throat: Oropharynx is clear and moist. No oropharyngeal exudate.  Eyes: Conjunctivae and EOM are normal. Pupils are equal, round, and reactive to light. Right eye exhibits no discharge. Left eye exhibits no discharge. No scleral icterus.  Neck: Normal range of motion. Neck supple. No thyromegaly present.  Cardiovascular: Normal rate, regular rhythm and normal heart sounds.  No murmur heard. The heart is regular at 72/min  Pulmonary/Chest: Effort normal and breath sounds normal. No respiratory distress. He has no wheezes. He has no rales.  Minimal wheezes and congestion today.  Definitely improved from previously.  Abdominal: Soft. Bowel sounds are normal. There is no tenderness. There is no rebound and no guarding.  Musculoskeletal: Normal range of motion. He exhibits no edema.  Lymphadenopathy:    He has no cervical adenopathy.  Neurological: He is alert and oriented to person, place, and time.  Skin: Skin is warm and dry. No rash noted.  Psychiatric: He has  a normal mood and affect. His behavior is normal. Judgment and thought content normal.  Nursing note and vitals reviewed.    BP 123/64 (BP Location: Left Arm)   Pulse 61   Temp (!) 96.8 F (36 C) (Oral)   Ht 5\' 6"  (1.676 m)   Wt 153 lb (69.4 kg)   BMI 24.69 kg/m       Assessment & Plan:  1. Community acquired pneumonia of left lower lobe of lung ([place]) -The patient appears to be much better with this based on physical findings today.  He will continue proactively by using his inhalers correctly and using the rescue inhaler as needed and taking the Mucinex and drinking plenty of fluids.  2. Thoracic ascending aortic aneurysm ([place]) -Continue with aggressive therapeutic lifestyle changes  3. Aortic atherosclerosis ([place]) -Continue with therapeutic lifestyle changes  Patient Instructions  The patient  will continue with his Mucinex twice daily his inhalers regularly drinking plenty of fluids but staying well-hydrated and staying out of crowds of people. He will take a course of doxycycline for 7 days which she already has at home in stock twice daily with food if he is not better in the next 3-4 days. He should call us back if his condition worsens.  Arrie Senate MD

## 2017-01-12 ENCOUNTER — Encounter: Payer: Self-pay | Admitting: Family Medicine

## 2017-01-13 ENCOUNTER — Other Ambulatory Visit: Payer: Self-pay | Admitting: Internal Medicine

## 2017-01-16 ENCOUNTER — Encounter: Payer: Self-pay | Admitting: Family Medicine

## 2017-01-16 ENCOUNTER — Other Ambulatory Visit: Payer: Self-pay | Admitting: *Deleted

## 2017-01-16 MED ORDER — DOXYCYCLINE HYCLATE 100 MG PO TABS: 100.0000 mg | ORAL_TABLET | Freq: Two times a day (BID) | ORAL | 0 refills | Status: DC

## 2017-01-20 ENCOUNTER — Telehealth: Payer: Self-pay | Admitting: Family Medicine

## 2017-01-20 ENCOUNTER — Encounter: Payer: Self-pay | Admitting: Internal Medicine

## 2017-01-20 ENCOUNTER — Ambulatory Visit (INDEPENDENT_AMBULATORY_CARE_PROVIDER_SITE_OTHER): Payer: Medicare Other | Admitting: Family Medicine

## 2017-01-20 ENCOUNTER — Encounter: Payer: Self-pay | Admitting: Family Medicine

## 2017-01-20 VITALS — BP 110/62 | HR 93 | Temp 97.8°F | Ht 66.0 in | Wt 153.0 lb

## 2017-01-20 DIAGNOSIS — Z77110 Contact with and (suspected) exposure to air pollution: Secondary | ICD-10-CM

## 2017-01-20 DIAGNOSIS — J441 Chronic obstructive pulmonary disease with (acute) exacerbation: Secondary | ICD-10-CM | POA: Diagnosis not present

## 2017-01-20 MED ORDER — PREDNISONE 20 MG PO TABS: 20.0000 mg | ORAL_TABLET | Freq: Every day | ORAL | 0 refills | Status: DC

## 2017-01-20 NOTE — Patient Instructions (Signed)
1.  Continue with doxycycline for 7 more days 2.  Prednisone 20 mg daily for 5 days 3.  Continue with Symbicort and Mucinex

## 2017-01-20 NOTE — Progress Notes (Signed)
Patient ID: [name], male   DOB: May 10, 1932, 83 y.o.   MRN: [id number] Patient here today for continued cough and congestion.  For most of this month of November he has had this cough and congestion.  He first took Levaquin for 1 week and thought he improved but subsequently started on doxycycline for which he has a standing prescription.  He had chest x-ray done earlier this month which showed no pneumonia.  He continues to use Symbicort.  Cough is productive of gray sputum.  He has not had fever or chills.  He does have a history of COPD and is followed by pulmonology.     Patient Active Problem List   Diagnosis Date Noted  . Aortic atherosclerosis ([place]) 11/21/2016  . Ascending aortic aneurysm ([place]) 08/10/2016  . Atherosclerosis of native coronary artery of native heart without angina pectoris 08/10/2016  . Gastritis 05/04/2016  . Hiatal hernia 05/04/2016  . Abnormal chest x-ray 03/28/2016  . Esophageal stricture   . Non-intractable vomiting   . Abdominal pain, epigastric   . Dysphagia   . Atrial fibrillation with rapid ventricular response ([place])   . Elevated troponin   . Atrial fibrillation with RVR ([place]) 06/12/2015  . Pneumonia 06/12/2015  . Leukocytosis 06/12/2015  . Clot retention of urine 06/11/2015  . BPH (benign prostatic hypertrophy) with urinary obstruction 06/11/2015  . Postoperative anemia due to acute blood loss 06/11/2015  . Hematuria 06/10/2015  . Neck pain, bilateral 12/31/2013  . Neuropathy 12/31/2013  . Vitamin B 12 deficiency 12/31/2013  . Abnormality of gait 06/07/2013  . Esophageal ulcer with bleeding 04/20/2013  . S/P left and ritght THA, AA 07/21/2011  . Special screening for malignant neoplasms, colon 01/03/2011  . Personal history of colonic polyps 01/03/2011  . COPD GOLD I 08/24/2010  . GI BLEED 11/17/2009  . ESOPHAGEAL REFLUX 08/25/2009  . BLOOD IN STOOL-MELENA 08/25/2009  . COLONIC POLYPS 07/17/2008  . ANEMIA 07/17/2008  . HYPOTENSION  07/17/2008  . Gastroesophageal reflux disease with esophagitis 07/17/2008  . DIVERTICULOSIS, MILD 07/17/2008  . DEGENERATIVE JOINT DISEASE 07/17/2008  . Osteoporosis 07/17/2008  . BPH (benign prostatic hyperplasia) 07/17/2008  . Hyperprolactinemia ([place]) 10/24/2007  . PITUITARY ADENOMA 06/25/2007  . Hypothyroidism 06/25/2007  . Hyperlipidemia LDL goal <70 06/22/2007  . Coronary atherosclerosis 06/22/2007   Outpatient Encounter Medications as of 01/20/2017  Medication Sig  . albuterol (PROVENTIL HFA;VENTOLIN HFA) 108 (90 Base) MCG/ACT inhaler Inhale 2 puffs into the lungs every 6 (six) hours as needed for wheezing or shortness of breath.  . bisacodyl (DULCOLAX) 10 MG suppository Place 10 mg rectally as needed for moderate constipation.  . bromocriptine (PARLODEL) 2.5 MG tablet Take 2.5 mg by mouth 2 (two) times daily.  . bromocriptine (PARLODEL) 2.5 MG tablet TAKE  (1)  TABLET TWICE A DAY.  . calcium carbonate (OSCAL) 1500 (600 Ca) MG TABS tablet Take 600 mg of elemental calcium by mouth daily with breakfast.   . cholecalciferol (VITAMIN D) 1000 UNITS tablet Take 1,000 Units by mouth daily.   Marland Kitchen diltiazem (CARDIZEM CD) 120 MG 24 hr capsule TAKE (1) CAPSULE DAILY  . doxycycline (VIBRA-TABS) 100 MG tablet Take 1 tablet (100 mg total) 2 (two) times daily by mouth. 1 po bid  . ferrous sulfate 325 (65 FE) MG tablet Take 325 mg by mouth daily with breakfast.   . guaiFENesin (MUCINEX) 600 MG 12 hr tablet Take 600 mg by mouth daily.   Marland Kitchen levothyroxine (SYNTHROID, LEVOTHROID) 50 MCG tablet TAKE 1  TABLET IN MORNING  . magnesium oxide (MAG-OX) 400 (241.3 Mg) MG tablet Take 400 mg by mouth every other day.  . montelukast (SINGULAIR) 10 MG tablet Take 10 mg by mouth at bedtime as needed (for allergies).   . naphazoline-pheniramine (NAPHCON-A) 0.025-0.3 % ophthalmic solution Place 1 drop into both eyes 2 (two) times daily as needed for irritation or allergies.   . nitroGLYCERIN (NITROSTAT) 0.4 MG SL tablet  Place 1 tablet (0.4 mg total) under the tongue every 5 (five) minutes as needed for chest pain.  . pantoprazole (PROTONIX) 40 MG tablet TAKE (1) TABLET TWICE A DAY BEFORE MEALS.  Marland Kitchen polyethylene glycol (MIRALAX / GLYCOLAX) packet Take 17 g by mouth daily. (Patient taking differently: Take 17 g by mouth daily as needed for moderate constipation. )  . Respiratory Therapy Supplies (FLUTTER) DEVI 1 Device by Does not apply route as needed.  . sucralfate (CARAFATE) 1 g tablet TAKE (1) TABLET FOUR TIMES DAILY AS NEEDED FOR GI UPSET  . SYMBICORT 160-4.5 MCG/ACT inhaler Inhale 2 puffs into the lungs 2 (two) times daily.  . vitamin B-12 (CYANOCOBALAMIN) 1000 MCG tablet Take 1,000 mcg by mouth daily.  . vitamin E (VITAMIN E) 400 UNIT capsule Take 400 Units by mouth daily.   No facility-administered encounter medications on file as of 01/20/2017.        BP 110/62 (BP Location: Left Arm)   Pulse 93   Temp 97.8 F (36.6 C) (Oral)   Ht 5\' 6"  (1.676 m)   Wt 153 lb (69.4 kg)   BMI 24.69 kg/m   Patient in no acute distress.  Breathing is easy. HEENT: Sinuses are nontender throat is clear Chest: Lungs have some rhonchi bilaterally.  Normal to percussion.  Assessment: No evidence for pneumonia.  Seems to be prolonged exacerbation.  I have recommended continuation of doxycycline for total of 2 weeks.  Will add prednisone 20 mg daily for 5 days.  Continue with Symbicort Mucinex.

## 2017-01-23 NOTE — Telephone Encounter (Signed)
No additional recs but if the problem has not been corrected at the conclusion of rx by ent happy to see him back here with all meds in hand to regroup

## 2017-01-24 ENCOUNTER — Encounter: Payer: Self-pay | Admitting: Family Medicine

## 2017-01-31 ENCOUNTER — Encounter: Payer: Self-pay | Admitting: Internal Medicine

## 2017-01-31 ENCOUNTER — Ambulatory Visit: Payer: Medicare Other | Admitting: Internal Medicine

## 2017-01-31 ENCOUNTER — Telehealth (HOSPITAL_COMMUNITY): Payer: Self-pay

## 2017-01-31 VITALS — BP 120/68 | HR 66 | Ht 67.0 in | Wt 150.4 lb

## 2017-01-31 DIAGNOSIS — J449 Chronic obstructive pulmonary disease, unspecified: Secondary | ICD-10-CM | POA: Diagnosis not present

## 2017-01-31 DIAGNOSIS — R9389 Abnormal findings on diagnostic imaging of other specified body structures: Secondary | ICD-10-CM

## 2017-01-31 NOTE — Assessment & Plan Note (Signed)
CT w/o contrast Chest  05/20/16 1. Some clearing of peripheral consolidation in the right lower lobe. There remains slight dilatation of right lower lobe bronchi with intraluminal material in the right lower lobe bronchi. There remains slightly nodular appearing infiltrates within the right lower lobe. Bronchoscopy may be considered for further evaluation as indicated. 2. Resolving inflammatory foci in the right middle lobe. 3. Stable 5 mm left lower lobe subpleural nodule. 4. Aneurysmal dilatation of the ascending aorta up to 4.4 cm.    Flutter valve added to rx 06/13/2016 > not using 01/31/2017 Ct chest 11/21/16 Re- demonstrated material within the right lower lobe bronchi most suggestive of aspirated material. Slight interval increase in subpleural consolidation within the medial right lower lobe which may represent combination of aspiration, infection and/or scarring  Discussed in detail all the  indications, usual  risks and alternatives  relative to the benefits with patient who agrees to proceed with bronchoscopy based on persistent symptoms and intraluminal irregularities despite improved swallowing historically >  Need to r/o retained FB/ tumor in the RML / RLL bronchi   I had an extended discussion with the patient reviewing all relevant studies completed to date and  lasting 15 to 20 minutes of a 25 minute visit    Each maintenance medication was reviewed in detail including most importantly the difference between maintenance and prns and under what circumstances the prns are to be triggered using an action plan format that is not reflected in the computer generated alphabetically organized AVS.    Please see AVS for specific instructions unique to this visit that I personally wrote and verbalized to the the pt in detail and then reviewed with pt  by my nurse highlighting any  changes in therapy recommended at today's visit to their plan of care.

## 2017-01-31 NOTE — Patient Instructions (Addendum)
Come to outpatient registration at [facility] (behind the ER) at  [facility] Wednesday 02/01/17  with nothing to eat  after midnight tonight .for Bronchoscopy - ok to have some juice for bfast with meds but nothing by mouth after 8 am Wednesday morning.

## 2017-01-31 NOTE — Progress Notes (Signed)
Subjective:   Patient ID: [name], male    DOB: [birth year]   MRN: [id number]    Brief patient profile:  84   yowm quit smoking completely around 1980 (relatively light) with cough and short of breath eval by  Dr Eliberto Ivory  Then [name] dx of copd/ cb  pft's showed fev1 116% 02/2010 though ratio 66 c/w GOLD I criteria    History of Present Illness  07/05/2010 ov cc recurrent pna's since June 2011 cc not back to baseline in terms of activities he enjoyed in May, for example  Could do some yardwork  and rarely  Needed saba and no need for any maint rx,   but on  spriva for sob and still frequent tightness generalized front more than back,  Equal both sides  low grade fever and mucus gets thick and yellow> admit to [facility] 24-27 by [facility] dx of ? Pna.   No sob at rest.   Continue protonix 40 mg  Take 30-60 min before first meal of the day and Pepcid 20 mg at bedtime GERD (REFLUX) diet Try symbicort 160 Take 2 puffs bid and work on hfa technique  08/24/2010 ov/[facility]/[name] cc breathing and cough better despite L rib fx 07/22/10. Main concern is how to prevent future exacerbations.  No limiting sob but not as active since quit smoking. Doing so much better overall rec  Ok to stop spiriva unless it reduces your desired activity performance (it's like high octane fuel for your lungs so your can be all you can be) Continue symbicort 160 Take 2 puffs first thing in am and then another 2 puffs about 12 hours later.    In event of recurrent signs of infection:  Change in sputum, fever, cough, short of breath or sore throat go ahead and take a course of doxycycline - if this doesn't help we need to see you in [place] ([facility]/ [name]) F/u prn       07/12/2011 f/u ov/[facility] cc dyspnea and cough "the best they've been in years"  here for preop eval for L hip on 23rd May by Alvan Dame,  used 2 rounds of abx since last ov for purulent sputum which cleared  and symbicort 2 bid and one mucinex and generally sleeping  well,  But since pollen season increased sub wheezing so started singulair > resolved.  rec Ok for surgery  Between now and then be sure to continue the singulair each day and Pepcid each night.    09/24/2012 f/u ov/[facility] doxy 4/10  Chief Complaint  Patient presents with  . Follow-up    Pt states that overall his breathing is doing okay- he had hip replaced-rt on 09/07/12 and afterwards had some respiratory symptoms that are now clearing up taking doxycycline.Still has occ prod cough with light yellow sputum.    no singulair or pepcid at present, not on symbicort either, misunderstood instructions/ contingencies Was taking albuterol up to every 4 hours and now down to twice daily with relief of cough and sob  rec Plan A = automatic = symbicort 160 2puffs each am and protonix 40 mg Take 30-60 min before first meal of the day  For cough and congestion, mucinex dose = 1200 mg every 12 hours and add pain killer to the mucinex if needed For nasty mucus > doxy x 10 days For any respiratory symptom flare, take max dose of symbiocort 160 Take 2 puffs first thing in am and then another 2 puffs about 12  hours later.  and singulair and pepcid 20 mg at bedtime.  Only use your albuterol as a rescue medication to be used if you can't catch your breath       02/25/2013 f/u ov/[facility] re: copd GOLD I  Chief Complaint  Patient presents with  . Follow-up    Pt states his breathing is stable.  Pt c/o chest congestion when laying down, SOB with activity and bending over.   No need for saba as long as taking symbiocort and only using it in am  Congestion at hs not using either symbicort or pepcid as part of the action plan as prev recommended No discolored sputum. Activities not limited by sob rec Whenever you aren't doing 100% better take symbicort 160 Take 2 puffs first thing in am and then another 2 puffs about 12 hours later.  And use the pepcid 20 mg one at a time and if that's not working, add back  singulair    06/13/2016  extended ov/[facility] re: re establish re abn cxr  Chief Complaint  Patient presents with  . Pulm Consult    Former MW pt. Had a difficult round of PNA back in January 2018. Had 2 CTs performed and PCP noticed two suspicious spots on lungs. Was told to follow up with pulm as a precaution.   severe swallowing issues chronically and finally admitted Mar 25 2016 p food impaction with repeat EGD 05/03/16 still showing significant stricture requiring dilation but doing better now  rec Plan A = Automatic = symbicort 160 Take 2 puffs first thing in am and then another 2 puffs about 12 hours later.  Work on maintaining   perfect inhaler technique:     Plan B = Backup Only use your albuterol (PROAIR) as a rescue medication Use the flutter valve as much as possible when coughing      01/31/2017  f/u ov/[facility] re:  Copd 1/ chronic bronchitis / ? Prev aspiraton with abnormal cT chest   Chief Complaint  Patient presents with  . Follow-up    Pt states he has had "a touch of PNA". He has been coughing since the beginning on Nov 2018.  He has taken doxy and levaquin.  He is coughing up greyish white sputum.  He has an albuterol inhaler that he rarely uses.    swallowing better now but still very congested cough esp since early nov 2018  Not using flutter valve as rec  hfa has improved but not yet adequate Still some hb/ sore throat symptoms despite reporting ppi bid  Also still some noct cough sev hours p lie down despite using 6 inch blocks    No obvious day to day or daytime variability or assoc  mucus plugs or hemoptysis or cp or chest tightness, subjective wheeze or overt sinus  symptoms. No unusual exposure hx or h/o childhood pna/ asthma or knowledge of premature birth.    Also denies any obvious fluctuation of symptoms with weather or environmental changes or other aggravating or alleviating factors except as outlined above   Current Allergies, Complete Past Medical History,  Past Surgical History, Family History, and Social History were reviewed in [employer] record.  ROS  The following are not active complaints unless bolded Hoarseness, sore throat, dysphagia, dental problems, itching, sneezing,  nasal congestion or discharge of excess mucus or purulent secretions, ear ache,   fever, chills, sweats, unintended wt loss or wt gain, classically pleuritic or exertional cp,  orthopnea pnd  or leg swelling, presyncope, palpitations, abdominal pain, anorexia, nausea, vomiting, diarrhea  or change in bowel habits or change in bladder habits, change in stools or change in urine, dysuria, hematuria,  rash, arthralgias, visual complaints, headache, numbness, weakness or ataxia or problems with walking or coordination,  change in mood/affect or memory.        Current Meds  Medication Sig  . albuterol (PROVENTIL HFA;VENTOLIN HFA) 108 (90 Base) MCG/ACT inhaler Inhale 2 puffs into the lungs every 6 (six) hours as needed for wheezing or shortness of breath.  . bisacodyl (DULCOLAX) 10 MG suppository Place 10 mg rectally as needed for moderate constipation.  . bromocriptine (PARLODEL) 2.5 MG tablet Take 2.5 mg by mouth 2 (two) times daily.  . calcium carbonate (OSCAL) 1500 (600 Ca) MG TABS tablet Take 600 mg of elemental calcium by mouth daily with breakfast.   . cholecalciferol (VITAMIN D) 1000 UNITS tablet Take 1,000 Units by mouth daily.   Marland Kitchen diltiazem (CARDIZEM CD) 120 MG 24 hr capsule TAKE (1) CAPSULE DAILY  . ferrous sulfate 325 (65 FE) MG tablet Take 325 mg by mouth daily with breakfast.   . guaiFENesin (MUCINEX) 600 MG 12 hr tablet Take 600 mg by mouth daily.   Marland Kitchen levothyroxine (SYNTHROID, LEVOTHROID) 50 MCG tablet TAKE 1 TABLET IN MORNING  . magnesium oxide (MAG-OX) 400 (241.3 Mg) MG tablet Take 400 mg by mouth every other day.  . montelukast (SINGULAIR) 10 MG tablet Take 10 mg by mouth at bedtime as needed (for allergies).   . naphazoline-pheniramine  (NAPHCON-A) 0.025-0.3 % ophthalmic solution Place 1 drop into both eyes 2 (two) times daily as needed for irritation or allergies.   . nitroGLYCERIN (NITROSTAT) 0.4 MG SL tablet Place 1 tablet (0.4 mg total) under the tongue every 5 (five) minutes as needed for chest pain.  . pantoprazole (PROTONIX) 40 MG tablet TAKE (1) TABLET TWICE A DAY BEFORE MEALS.  Marland Kitchen polyethylene glycol (MIRALAX / GLYCOLAX) packet Take 17 g by mouth daily. (Patient taking differently: Take 17 g by mouth daily as needed for moderate constipation. )  . Respiratory Therapy Supplies (FLUTTER) DEVI 1 Device by Does not apply route as needed.  . sucralfate (CARAFATE) 1 g tablet TAKE (1) TABLET FOUR TIMES DAILY AS NEEDED FOR GI UPSET  . SYMBICORT 160-4.5 MCG/ACT inhaler Inhale 2 puffs into the lungs 2 (two) times daily.  . vitamin B-12 (CYANOCOBALAMIN) 1000 MCG tablet Take 1,000 mcg by mouth daily.  . vitamin E (VITAMIN E) 400 UNIT capsule Take 400 Units by mouth daily.                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 Bonchiectasis RLL- CT 2009 > not verified on CT 07/22/10   Past Surgical History:  Inguinal herniorrhaphy  PTCA/stent  LEFT & RIGHT FOOT RECONSTRUCTION  hiatal hernia repair 2009   Family History:  neg for pituitary dz  mother died age 3 from phlebitis  father died age 20 from heart failure  8 siblings  alive age 77,80,89,92  1 died age 1 heart failure  3  died age 64,76,90 from heart problems    Social History:  married   2 children  retiired  never smoked  no etoh         Objective:   Physical Exam   Pleasant somewhat frail elderly hoarse  amb wm nad   wt 167 07/05/2010  > 168 08/24/2010 > 04/05/2011 174> 07/12/2011  176> 09/24/2012  169 > 02/25/2013  175 >  01/31/2017   150       06/13/16 153 lb 12.8 oz (69.8 kg)  05/10/16 155 lb (70.3 kg)  05/06/16 154 lb (69.9 kg)     Vital signs reviewed - Note on arrival 02 sats  95% on RA     HEENT: nl dentition, turbinates bilaterally, and oropharynx. Nl external ear canals without cough reflex   NECK :  without JVD/Nodes/TM/ nl carotid upstrokes bilaterally   LUNGS: no acc muscle use,  Nl contour chest with  insp / exp junky rhonchi worse  R > L    CV:  RRR  no s3 or murmur or increase in P2, and no edema   ABD:  soft and nontender with nl inspiratory excursion in the supine position. No bruits or organomegaly appreciated, bowel sounds nl  MS:  Nl gait/ ext warm without deformities, calf tenderness, cyanosis or clubbing No obvious joint restrictions   SKIN: warm and dry without lesions    NEURO:  alert, approp, nl sensorium with  no motor or cerebellar deficits apparent.       I personally reviewed images and agree with radiology impression as follows:   Chest CT  11/21/16 1. Re- demonstrated material within the right lower lobe bronchi most suggestive of aspirated material. Slight interval increase in subpleural consolidation within the medial right lower lobe which may represent combination of aspiration, infection and/or scarring. 2. Re- demonstrated peripheral nodularity within the right lower lobe, likely secondary to scarring and/or aspirated material. 3. Stable aneurysmal dilatation ascending thoracic aorta measuring 4.4 cm. Recommend annual imaging followup by CTA or MRA. This recommendation follows 2010    Assessment & Plan:

## 2017-01-31 NOTE — Assessment & Plan Note (Signed)
-   Spirometry 03/30/2010 FEV1  2.86 (116%) ratio 66     - 06/13/2016  After extensive coaching HFA effectiveness =    75% from a baseline of 50%     - 01/31/2017  After extensive coaching HFA effectiveness =    75% at baseline   Relatively well compensated though clearly has very poor MC fx / cough mechanics for which he needs to learn to use hfa/ flutter valve more effectively bu tno change in rx needed otherwise

## 2017-02-01 ENCOUNTER — Encounter (HOSPITAL_COMMUNITY)
Admission: RE | Disposition: A | Discharge status: Home / Self Care | Payer: Self-pay | Source: Ambulatory Visit | Attending: Internal Medicine

## 2017-02-01 ENCOUNTER — Ambulatory Visit (HOSPITAL_COMMUNITY)
Admission: RE | Admit: 2017-02-01 | Discharge: 2017-02-01 | Disposition: A | Discharge status: Home / Self Care | Payer: Medicare Other | Source: Ambulatory Visit | Attending: Internal Medicine | Admitting: Internal Medicine

## 2017-02-01 ENCOUNTER — Encounter (HOSPITAL_COMMUNITY): Payer: Self-pay | Admitting: Respiratory Therapy

## 2017-02-01 DIAGNOSIS — J449 Chronic obstructive pulmonary disease, unspecified: Secondary | ICD-10-CM | POA: Insufficient documentation

## 2017-02-01 DIAGNOSIS — J984 Other disorders of lung: Secondary | ICD-10-CM | POA: Diagnosis not present

## 2017-02-01 DIAGNOSIS — K219 Gastro-esophageal reflux disease without esophagitis: Secondary | ICD-10-CM | POA: Diagnosis not present

## 2017-02-01 DIAGNOSIS — Z87891 Personal history of nicotine dependence: Secondary | ICD-10-CM | POA: Diagnosis not present

## 2017-02-01 DIAGNOSIS — E039 Hypothyroidism, unspecified: Secondary | ICD-10-CM | POA: Insufficient documentation

## 2017-02-01 DIAGNOSIS — Z7951 Long term (current) use of inhaled steroids: Secondary | ICD-10-CM | POA: Diagnosis not present

## 2017-02-01 DIAGNOSIS — D649 Anemia, unspecified: Secondary | ICD-10-CM | POA: Insufficient documentation

## 2017-02-01 DIAGNOSIS — Z79899 Other long term (current) drug therapy: Secondary | ICD-10-CM | POA: Insufficient documentation

## 2017-02-01 DIAGNOSIS — R9389 Abnormal findings on diagnostic imaging of other specified body structures: Secondary | ICD-10-CM | POA: Diagnosis not present

## 2017-02-01 DIAGNOSIS — J209 Acute bronchitis, unspecified: Secondary | ICD-10-CM | POA: Diagnosis not present

## 2017-02-01 DIAGNOSIS — I2510 Atherosclerotic heart disease of native coronary artery without angina pectoris: Secondary | ICD-10-CM | POA: Diagnosis not present

## 2017-02-01 HISTORY — PX: VIDEO BRONCHOSCOPY: SHX5072

## 2017-02-01 SURGERY — VIDEO BRONCHOSCOPY WITHOUT FLUORO
Anesthesia: Moderate Sedation | Laterality: Bilateral

## 2017-02-01 MED ORDER — LIDOCAINE HCL 2 % EX GEL: 1.0000 "application " | Freq: Once | CUTANEOUS | Status: DC

## 2017-02-01 MED ORDER — MIDAZOLAM HCL 5 MG/ML IJ SOLN: 1.0000 mg | Freq: Once | INTRAMUSCULAR | Status: DC

## 2017-02-01 MED ORDER — PHENYLEPHRINE HCL 0.25 % NA SOLN
NASAL | Status: DC | PRN
  Administered 2017-02-01: 2 via NASAL

## 2017-02-01 MED ORDER — LIDOCAINE HCL 2 % EX GEL
CUTANEOUS | Status: DC | PRN
  Administered 2017-02-01: 1

## 2017-02-01 MED ORDER — LIDOCAINE HCL 1 % IJ SOLN
INTRAMUSCULAR | Status: DC | PRN
  Administered 2017-02-01: 6 mL via RESPIRATORY_TRACT

## 2017-02-01 MED ORDER — MEPERIDINE HCL 25 MG/ML IJ SOLN
INTRAMUSCULAR | Status: DC | PRN
  Administered 2017-02-01: 25 mg via INTRAVENOUS

## 2017-02-01 MED ORDER — MEPERIDINE HCL 100 MG/ML IJ SOLN
INTRAMUSCULAR | Status: AC
  Filled 2017-02-01: qty 2

## 2017-02-01 MED ORDER — MIDAZOLAM HCL 10 MG/2ML IJ SOLN
INTRAMUSCULAR | Status: DC | PRN
  Administered 2017-02-01: 2.5 mg via INTRAVENOUS

## 2017-02-01 MED ORDER — PHENYLEPHRINE HCL 0.25 % NA SOLN: 1.0000 | Freq: Four times a day (QID) | NASAL | Status: DC | PRN

## 2017-02-01 MED ORDER — SODIUM CHLORIDE 0.9 % IV SOLN
INTRAVENOUS | Status: DC
  Administered 2017-02-01: 13:00:00 via INTRAVENOUS

## 2017-02-01 MED ORDER — MIDAZOLAM HCL 5 MG/ML IJ SOLN
INTRAMUSCULAR | Status: AC
  Filled 2017-02-01: qty 2

## 2017-02-01 MED ORDER — MEPERIDINE HCL 100 MG/ML IJ SOLN: 100.0000 mg | Freq: Once | INTRAMUSCULAR | Status: DC

## 2017-02-01 NOTE — Discharge Instructions (Signed)
Flexible Bronchoscopy, Care After These instructions give you information on caring for yourself after your procedure. Your doctor may also give you more specific instructions. Call your doctor if you have any problems or questions after your procedure. Follow these instructions at home:  Do not eat or drink anything for 2 hours after your procedure. If you try to eat or drink before the medicine wears off, food or drink could go into your lungs. You could also burn yourself.  After 2 hours have passed and when you can cough and gag normally, you may eat soft food and drink liquids slowly.  The day after the test, you may eat your normal diet.  You may do your normal activities.  Keep all doctor visits. Get help right away if:  You get more and more short of breath.  You get light-headed.  You feel like you are going to pass out (faint).  You have chest pain.  You have new problems that worry you.  You cough up more than a little blood.  You cough up more blood than before. This information is not intended to replace advice given to you by your health care provider. Make sure you discuss any questions you have with your health care provider. Document Released: 12/12/2008 Document Revised: 07/23/2015 Document Reviewed: 10/19/2012 Elsevier Interactive Patient Education  2017 [place].  Nothing to eat or drink until  3:00pm today 02/01/2017

## 2017-02-01 NOTE — Op Note (Signed)
Bronchoscopy Procedure Note  Date of Operation: 02/01/2017   Pre-op Diagnosis: endobronchial obst RLL   Post-op Diagnosis: copious thick yellow secretions   Surgeon: [name]  Anesthesia: Monitored Local Anesthesia with Sedation Time Started: 1255  Versed 2.5 mg IV/  Demerol 25 mg IV Time Stopped:  1315   Operation: Video Flexible fiberoptic bronchoscopy, diagnostic   Findings: copious Mucopurulent  secretions R > L lower lobes  Specimen: BAL RLL  Estimated Blood Loss: none  Complications: none  Indications and History: See updated H and P same date. The risks, benefits, complications, treatment options and expected outcomes were discussed with the patient.  The possibilities of reaction to medication, pulmonary aspiration, perforation of a viscus, bleeding, failure to diagnose a condition and creating a complication requiring transfusion or operation were discussed with the patient who freely signed the consent.    Description of Procedure: The patient was re-examined in the bronchoscopy suite and the site of surgery properly noted/marked.  The patient was identified  and the procedure verified as Flexible Fiberoptic Bronchoscopy.  A Time Out was held and the above information confirmed.   After the induction of topical nasopharyngeal anesthesia, the patient was positioned  and the bronchoscope was passed through the R  naris. The vocal cords were visualized and  1% buffered lidocaine 5 ml was topically placed onto the cords. The cords were nl. The scope was then passed into the trachea.  1% buffered lidocaine given topically. Airways inspected bilaterally to the subsegmental level with the following findings:  Copious tenacious secretions R > L lower lobes s roping or true plugs, suctioned free with open but friable mucosa diffusely     Interventions:  BAL RLL for cytology/ afb and fungal and routine culture      The Patient was taken to the Endoscopy Recovery area in  satisfactory condition.  Attestation: I performed the procedure.  [name], MD Pulmonary and [place] [phone] After 5:30 PM or weekends, call [phone]

## 2017-02-01 NOTE — Progress Notes (Signed)
Video Bronchoscopy done Intervention Bronchial washing done Procedure  Tolerated well

## 2017-02-01 NOTE — H&P (Signed)
Brief patient profile:  84   yowm quit smoking completely around 1980 (relatively light) with cough and short of breath eval by  Dr Eliberto Ivory  Then [name] dx of copd/ cb  pft's showed fev1 116% 02/2010 though ratio 66 c/w GOLD I criteria    History of Present Illness  07/05/2010 ov cc recurrent pna's since June 2011 cc not back to baseline in terms of activities he enjoyed in May, for example  Could do some yardwork  and rarely  Needed saba and no need for any maint rx,   but on  spriva for sob and still frequent tightness generalized front more than back,  Equal both sides  low grade fever and mucus gets thick and yellow> admit to [facility] April 24-27 by [facility] dx of ? Pna.   No sob at rest.   Continue protonix 40 mg  Take 30-60 min before first meal of the day and Pepcid 20 mg at bedtime GERD (REFLUX) diet Try symbicort 160 Take 2 puffs bid and work on hfa technique  08/24/2010 ov/Haruki Arnold/[name] cc breathing and cough better despite L rib fx 07/22/10. Main concern is how to prevent future exacerbations.  No limiting sob but not as active since quit smoking. Doing so much better overall rec  Ok to stop spiriva unless it reduces your desired activity performance (it's like high octane fuel for your lungs so your can be all you can be) Continue symbicort 160 Take 2 puffs first thing in am and then another 2 puffs about 12 hours later.    In event of recurrent signs of infection:  Change in sputum, fever, cough, short of breath or sore throat go ahead and take a course of doxycycline - if this doesn't help we need to see you in [place] (Svea Pusch/ [name]) F/u prn       07/12/2011 f/u ov/Reece Fehnel cc dyspnea and cough "the best they've been in years"  here for preop eval for L hip on 23rd May by Alvan Dame,  used 2 rounds of abx since last ov for purulent sputum which cleared  and symbicort 2 bid and one mucinex and generally sleeping well,  But since pollen season increased sub wheezing so started singulair > resolved.   rec Ok for surgery  Between now and then be sure to continue the singulair each day and Pepcid each night.    09/24/2012 f/u ov/Autumn Pruitt doxy 4/10      Chief Complaint  Patient presents with  . Follow-up    Pt states that overall his breathing is doing okay- he had hip replaced-rt on 09/07/12 and afterwards had some respiratory symptoms that are now clearing up taking doxycycline.Still has occ prod cough with light yellow sputum.    no singulair or pepcid at present, not on symbicort either, misunderstood instructions/ contingencies Was taking albuterol up to every 4 hours and now down to twice daily with relief of cough and sob  rec Plan A = automatic = symbicort 160 2puffs each am and protonix 40 mg Take 30-60 min before first meal of the day  For cough and congestion, mucinex dose = 1200 mg every 12 hours and add pain killer to the mucinex if needed For nasty mucus > doxy x 10 days For any respiratory symptom flare, take max dose of symbiocort 160 Take 2 puffs first thing in am and then another 2 puffs about 12 hours later.  and singulair and pepcid 20 mg at bedtime.  Only use your albuterol as  a rescue medication to be used if you can't catch your breath       02/25/2013 f/u ov/Aariyah Sampey re: [place] I      Chief Complaint  Patient presents with  . Follow-up    Pt states his breathing is stable.  Pt c/o chest congestion when laying down, SOB with activity and bending over.   No need for saba as long as taking symbiocort and only using it in am  Congestion at hs not using either symbicort or pepcid as part of the action plan as prev recommended No discolored sputum. Activities not limited by sob rec Whenever you aren't doing 100% better take symbicort 160 Take 2 puffs first thing in am and then another 2 puffs about 12 hours later.  And use the pepcid 20 mg one at a time and if that's not working, add back singulair    06/13/2016  extended ov/Yashar Inclan re: re establish re abn  cxr      Chief Complaint  Patient presents with  . Pulm Consult    Former MW pt. Had a difficult round of PNA back in January 2018. Had 2 CTs performed and PCP noticed two suspicious spots on lungs. Was told to follow up with pulm as a precaution.   severe swallowing issues chronically and finally admitted Mar 25 2016 p food impaction with repeat EGD 05/03/16 still showing significant stricture requiring dilation but doing better now  rec Plan A = Automatic = symbicort 160 Take 2 puffs first thing in am and then another 2 puffs about 12 hours later.  Work on maintaining   perfect inhaler technique:     Plan B = Backup Only use your albuterol (PROAIR) as a rescue medication Use the flutter valve as much as possible when coughing      01/31/2017  f/u ov/Matthe Sloane re:  Copd 1/ chronic bronchitis / ? Prev aspiraton with abnormal cT chest       Chief Complaint  Patient presents with  . Follow-up    Pt states he has had "a touch of PNA". He has been coughing since the beginning on Nov 2018.  He has taken doxy and levaquin.  He is coughing up greyish white sputum.  He has an albuterol inhaler that he rarely uses.    swallowing better now but still very congested cough esp since early nov 2018  Not using flutter valve as rec  hfa has improved but not yet adequate Still some hb/ sore throat symptoms despite reporting ppi bid  Also still some noct cough sev hours p lie down despite using 6 inch blocks    No obvious day to day or daytime variability or assoc  mucus plugs or hemoptysis or cp or chest tightness, subjective wheeze or overt sinus  symptoms. No unusual exposure hx or h/o childhood pna/ asthma or knowledge of premature birth.    Also denies any obvious fluctuation of symptoms with weather or environmental changes or other aggravating or alleviating factors except as outlined above   Current Allergies, Complete Past Medical History, Past Surgical History, Family History, and Social  History were reviewed in [employer] record.  ROS  The following are not active complaints unless bolded Hoarseness, sore throat, dysphagia, dental problems, itching, sneezing,  nasal congestion or discharge of excess mucus or purulent secretions, ear ache,   fever, chills, sweats, unintended wt loss or wt gain, classically pleuritic or exertional cp,  orthopnea pnd or leg swelling, presyncope, palpitations,  abdominal pain, anorexia, nausea, vomiting, diarrhea  or change in bowel habits or change in bladder habits, change in stools or change in urine, dysuria, hematuria,  rash, arthralgias, visual complaints, headache, numbness, weakness or ataxia or problems with walking or coordination,  change in mood/affect or memory.            Current Meds  Medication Sig  . albuterol (PROVENTIL HFA;VENTOLIN HFA) 108 (90 Base) MCG/ACT inhaler Inhale 2 puffs into the lungs every 6 (six) hours as needed for wheezing or shortness of breath.  . bisacodyl (DULCOLAX) 10 MG suppository Place 10 mg rectally as needed for moderate constipation.  . bromocriptine (PARLODEL) 2.5 MG tablet Take 2.5 mg by mouth 2 (two) times daily.  . calcium carbonate (OSCAL) 1500 (600 Ca) MG TABS tablet Take 600 mg of elemental calcium by mouth daily with breakfast.   . cholecalciferol (VITAMIN D) 1000 UNITS tablet Take 1,000 Units by mouth daily.   Marland Kitchen diltiazem (CARDIZEM CD) 120 MG 24 hr capsule TAKE (1) CAPSULE DAILY  . ferrous sulfate 325 (65 FE) MG tablet Take 325 mg by mouth daily with breakfast.   . guaiFENesin (MUCINEX) 600 MG 12 hr tablet Take 600 mg by mouth daily.   Marland Kitchen levothyroxine (SYNTHROID, LEVOTHROID) 50 MCG tablet TAKE 1 TABLET IN MORNING  . magnesium oxide (MAG-OX) 400 (241.3 Mg) MG tablet Take 400 mg by mouth every other day.  . montelukast (SINGULAIR) 10 MG tablet Take 10 mg by mouth at bedtime as needed (for allergies).   . naphazoline-pheniramine (NAPHCON-A) 0.025-0.3 % ophthalmic solution  Place 1 drop into both eyes 2 (two) times daily as needed for irritation or allergies.   . nitroGLYCERIN (NITROSTAT) 0.4 MG SL tablet Place 1 tablet (0.4 mg total) under the tongue every 5 (five) minutes as needed for chest pain.  . pantoprazole (PROTONIX) 40 MG tablet TAKE (1) TABLET TWICE A DAY BEFORE MEALS.  Marland Kitchen polyethylene glycol (MIRALAX / GLYCOLAX) packet Take 17 g by mouth daily. (Patient taking differently: Take 17 g by mouth daily as needed for moderate constipation. )  . Respiratory Therapy Supplies (FLUTTER) DEVI 1 Device by Does not apply route as needed.  . sucralfate (CARAFATE) 1 g tablet TAKE (1) TABLET FOUR TIMES DAILY AS NEEDED FOR GI UPSET  . SYMBICORT 160-4.5 MCG/ACT inhaler Inhale 2 puffs into the lungs 2 (two) times daily.  . vitamin B-12 (CYANOCOBALAMIN) 1000 MCG tablet Take 1,000 mcg by mouth daily.  . vitamin E (VITAMIN E) 400 UNIT capsule Take 400 Units by mouth daily.                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 Bonchiectasis RLL- CT 2009 > not verified on CT 07/22/10   Past Surgical History:  Inguinal herniorrhaphy  PTCA/stent  LEFT & RIGHT FOOT RECONSTRUCTION  hiatal hernia repair 2009   Family History:  neg for pituitary dz  mother died age 34 from phlebitis  father died age 49 from heart failure  8 siblings  alive age 51,80,89,92  1 died age 69 heart failure  3 died  age 38,76,90 from heart problems    Social History:  married  2 children  retiired  never  smoked  no etoh         Objective:   Physical Exam   Pleasant somewhat frail elderly hoarse  amb wm nad   wt 167 07/05/2010  > 168 08/24/2010 > 04/05/2011 174> 07/12/2011  176> 09/24/2012  169 > 02/25/2013  175 >  01/31/2017   150          06/13/16 153 lb 12.8 oz (69.8 kg)  05/10/16 155 lb (70.3 kg)  05/06/16 154 lb (69.9 kg)     Vital signs reviewed - Note on arrival 02 sats  95% on RA     HEENT: nl dentition, turbinates bilaterally, and oropharynx. Nl external ear canals without cough reflex   NECK :  without JVD/Nodes/TM/ nl carotid upstrokes bilaterally   LUNGS: no acc muscle use,  Nl contour chest with  insp / exp junky rhonchi worse  R > L    CV:  RRR  no s3 or murmur or increase in P2, and no edema   ABD:  soft and nontender with nl inspiratory excursion in the supine position. No bruits or organomegaly appreciated, bowel sounds nl  MS:  Nl gait/ ext warm without deformities, calf tenderness, cyanosis or clubbing No obvious joint restrictions   SKIN: warm and dry without lesions    NEURO:  alert, approp, nl sensorium with  no motor or cerebellar deficits apparent.       I personally reviewed images and agree with radiology impression as follows:   Chest CT  11/21/16 1. Re- demonstrated material within the right lower lobe bronchi most suggestive of aspirated material. Slight interval increase in subpleural consolidation within the medial right lower lobe which may represent combination of aspiration, infection and/or scarring. 2. Re- demonstrated peripheral nodularity within the right lower lobe, likely secondary to scarring and/or aspirated material. 3. Stable aneurysmal dilatation ascending thoracic aorta measuring 4.4 cm. Recommend annual imaging followup by CTA or MRA. This recommendation follows 2010    Assessment & Plan:      Patient Instructions by Tanda Rockers, MD at 01/31/2017 11:30 AM   Author: Tanda Rockers, MD  Author Type: Physician Filed: 01/31/2017 1:51 PM  Note Status: Addendum Cosign: Cosign Not Required Encounter Date: 01/31/2017  Editor: Tanda Rockers, MD (Physician)  Prior Versions: 1. Tanda Rockers, MD (Physician) at 01/31/2017 12:08 PM - Signed    Come to outpatient registration at [facility] (behind the ER) at  [facility] Wednesday 02/01/17  with nothing to eat  after midnight tonight .for Bronchoscopy - ok to have some juice for bfast with meds but nothing by mouth after 8 am Wednesday morning.           Assessment & Plan Note by Tanda Rockers, MD at 01/31/2017 1:49 PM   Author: Tanda Rockers, MD Author Type: Physician Filed: 01/31/2017 1:51 PM  Note Status: Written Cosign: Cosign Not Required Encounter Date: 01/31/2017  Problem: Abnormal chest x-ray  Editor: Tanda Rockers, MD (Physician)    CT w/o contrast Chest  05/20/16 1. Some clearing of peripheral consolidation in the right lower lobe. There remains slight dilatation of right lower lobe bronchi with intraluminal material in the right lower lobe bronchi. There remains slightly nodular appearing infiltrates within the right lower lobe. Bronchoscopy may be considered for further evaluation as indicated. 2. Resolving inflammatory foci in the right middle  lobe. 3. Stable 5 mm left lower lobe subpleural nodule. 4. Aneurysmal dilatation of the ascending aorta up to 4.4 cm.    Flutter valve added to rx 06/13/2016 > not using 01/31/2017 Ct chest 11/21/16 Re- demonstrated material within the right lower lobe bronchi most suggestive of aspirated material. Slight interval increase in subpleural consolidation within the medial right lower lobe which may represent combination of aspiration, infection and/or scarring  Discussed in detail all the  indications, usual  risks and alternatives  relative to the benefits with patient who agrees to proceed with bronchoscopy based on persistent symptoms and intraluminal  irregularities despite improved swallowing historically >  Need to r/o retained FB/ tumor in the RML / RLL bronchi         02/01/2017  Day of FOB, no change in symptoms or exam     [name], MD Pulmonary and [place] Cell [phone] After 5:30 PM or weekends, use Beeper [phone]

## 2017-02-02 LAB — ACID FAST SMEAR (AFB, MYCOBACTERIA): Acid Fast Smear: NEGATIVE

## 2017-02-03 ENCOUNTER — Telehealth: Payer: Self-pay | Admitting: Internal Medicine

## 2017-02-03 NOTE — Telephone Encounter (Signed)
Pharm is [facility] in [place].

## 2017-02-03 NOTE — Telephone Encounter (Signed)
Spoke with pt who states that he had the bronch done 02/01/17 and had fluid removed then as well.  Pt states that he has a lot of congestion and towards the end of the day he feels feverish, even though he is not running a fever.  Dr. Melvyn Novas, please advise if we know of a medicine that we can prescribe for pt to help with his symptoms and also if we have the results back from the bronch as well.  Thanks!

## 2017-02-03 NOTE — Telephone Encounter (Signed)
All the cultures are neg so far so no role for abx here  Continue mucinex or mucinex dm 1200 mg every 12 hours and albuterol prn   To ER over w/e if condition worsens on Rx

## 2017-02-03 NOTE — Telephone Encounter (Signed)
Called pt and advised message from the provider. Pt understood and verbalized understanding. Nothing further is needed.    

## 2017-02-04 ENCOUNTER — Other Ambulatory Visit: Payer: Self-pay | Admitting: Family Medicine

## 2017-02-04 ENCOUNTER — Other Ambulatory Visit: Payer: Self-pay | Admitting: Internal Medicine

## 2017-02-04 LAB — CULTURE, RESPIRATORY W GRAM STAIN: Culture: NORMAL

## 2017-02-04 LAB — CULTURE, RESPIRATORY

## 2017-02-06 ENCOUNTER — Other Ambulatory Visit: Payer: Self-pay | Admitting: Endocrinology

## 2017-02-07 ENCOUNTER — Encounter: Payer: Self-pay | Admitting: Internal Medicine

## 2017-02-08 MED ORDER — DOXYCYCLINE HYCLATE 100 MG PO TABS: 100.0000 mg | ORAL_TABLET | Freq: Two times a day (BID) | ORAL | 0 refills | Status: DC

## 2017-02-08 NOTE — Telephone Encounter (Signed)
Ok to call in doxy 100mg  bid x 10 days but no other suggestions and make sure he brings all meds/ solutions/ inhalers / devices with him for f/u

## 2017-02-08 NOTE — Telephone Encounter (Signed)
Nothing else to suggest for now as all studies neg  Needs ov with all meds in hand / flutter valve in hand to put all the cards on the table but needs linmited sinus CT first to complete the w/u

## 2017-02-08 NOTE — Telephone Encounter (Signed)
Received the following email from patient:   "[name] DOB 06-01-2032-- Bronchoscopyprocedure 02-01-17 -I continue to cough up lots of mucus (usually clear when not using the flutter device) and also haveon going sinus drainage. When using the flutter device I usually will cough up from deep down a dark brown thick mucus material. This is also true during the night a couple of times. Often times I come near upchucking. - During the morning hours I feel the best but begin to feel worse as the day progresses. - I'm staying inside, drinking lots of liquids, and taking related pills/meds. There are infrequent spells of coughing during the day, talking will usually cause such. --- Do you have any suggestions? Thanks for all that you do."  MW, please advise. Thanks!

## 2017-02-25 ENCOUNTER — Telehealth: Payer: Self-pay | Admitting: Family Medicine

## 2017-02-25 ENCOUNTER — Encounter: Payer: Self-pay | Admitting: Internal Medicine

## 2017-02-25 NOTE — Telephone Encounter (Signed)
Please advise 

## 2017-02-25 NOTE — Telephone Encounter (Signed)
What symptoms do you have? Congestion, fever  How long have you been sick? A couple days ago the congestion started and today he started running a fever  Have you been seen for this problem? Yes in the past few months and was given antibiotics and it seems like after taking them in a week he starts having the same symptoms again  If your provider decides to give you a prescription, which pharmacy would you like for it to be sent to? PT Has doxycycline wants to know if he can take that, [facility]   Patient informed that this information will be sent to the clinical staff for review and that they should receive a follow up call.

## 2017-02-25 NOTE — Telephone Encounter (Signed)
Start the doxycycline ( has 16 pills at home). If not better by 03/01/17, call office

## 2017-02-27 ENCOUNTER — Telehealth: Payer: Self-pay | Admitting: Internal Medicine

## 2017-02-27 MED ORDER — AZITHROMYCIN 250 MG PO TABS: ORAL_TABLET | ORAL | 0 refills | Status: AC

## 2017-02-27 NOTE — Telephone Encounter (Signed)
Suggest he continue doxycycline, but add Z pak for broader coverage    250 mg, # 6, 2 today then one daily Stay well- hydrated. If gets worse go to ER.

## 2017-02-27 NOTE — Telephone Encounter (Signed)
Email message regarding this also.

## 2017-02-27 NOTE — Telephone Encounter (Signed)
Spoke with patient Pt has congestion, productive cough- clear and gray worsen in last 6 days. Pt had fever for last 4 days of 102.1 taking tylenol for fever. Pt taking doxycycline 2x daily since 02/25/2017 and using flutter valve.   Pt still not feeling any better since Saturday. Current Outpatient Medications on File Prior to Visit  Medication Sig Dispense Refill  . albuterol (PROVENTIL HFA;VENTOLIN HFA) 108 (90 Base) MCG/ACT inhaler Inhale 2 puffs into the lungs every 6 (six) hours as needed for wheezing or shortness of breath. 18 g 11  . bisacodyl (DULCOLAX) 10 MG suppository Place 10 mg rectally as needed for moderate constipation.    . bromocriptine (PARLODEL) 2.5 MG tablet Take 2.5 mg by mouth 2 (two) times daily.    . calcium carbonate (OSCAL) 1500 (600 Ca) MG TABS tablet Take 600 mg of elemental calcium by mouth daily with breakfast.     . cholecalciferol (VITAMIN D) 1000 UNITS tablet Take 1,000 Units by mouth daily.     Marland Kitchen diltiazem (CARDIZEM CD) 120 MG 24 hr capsule TAKE (1) CAPSULE DAILY 90 capsule 0  . doxycycline (VIBRA-TABS) 100 MG tablet Take 1 tablet (100 mg total) by mouth 2 (two) times daily. 20 tablet 0  . ferrous sulfate 325 (65 FE) MG tablet Take 325 mg by mouth daily with breakfast.     . guaiFENesin (MUCINEX) 600 MG 12 hr tablet Take 600 mg by mouth daily.     Marland Kitchen levothyroxine (SYNTHROID, LEVOTHROID) 50 MCG tablet TAKE 1 TABLET IN MORNING 30 tablet 0  . magnesium oxide (MAG-OX) 400 (241.3 Mg) MG tablet Take 400 mg by mouth every other day.    . montelukast (SINGULAIR) 10 MG tablet Take 10 mg by mouth at bedtime as needed (for allergies).     . naphazoline-pheniramine (NAPHCON-A) 0.025-0.3 % ophthalmic solution Place 1 drop into both eyes 2 (two) times daily as needed for irritation or allergies.     . nitroGLYCERIN (NITROSTAT) 0.4 MG SL tablet Place 1 tablet (0.4 mg total) under the tongue every 5 (five) minutes as needed for chest pain. 25 tablet 2  . pantoprazole (PROTONIX)  40 MG tablet TAKE (1) TABLET TWICE A DAY BEFORE MEALS. 60 tablet 0  . polyethylene glycol (MIRALAX / GLYCOLAX) packet Take 17 g by mouth daily. (Patient taking differently: Take 17 g by mouth daily as needed for moderate constipation. ) 30 each 3  . Respiratory Therapy Supplies (FLUTTER) DEVI 1 Device by Does not apply route as needed. 1 each 0  . sucralfate (CARAFATE) 1 g tablet TAKE (1) TABLET FOUR TIMES DAILY AS NEEDED FOR GI UPSET (Patient taking differently: TAKE (1) TABLET FOUR TIMES DAILY) 120 tablet 1  . SYMBICORT 160-4.5 MCG/ACT inhaler Inhale 2 puffs into the lungs 2 (two) times daily. 10.2 g 5  . vitamin B-12 (CYANOCOBALAMIN) 1000 MCG tablet Take 1,000 mcg by mouth daily.    . vitamin E (VITAMIN E) 400 UNIT capsule Take 400 Units by mouth daily.     No current facility-administered medications on file prior to visit.     Allergies  Allergen Reactions  . Betadine [Povidone Iodine] Other (See Comments)    Reaction:  Blisters   . Lortab [Hydrocodone-Acetaminophen] Nausea And Vomiting  . Penicillins Other (See Comments)    Reaction:  Lightheadedness  Has patient had a PCN reaction causing immediate rash, facial/tongue/throat swelling, SOB or lightheadedness with hypotension: Yes Has patient had a PCN reaction causing severe rash involving mucus membranes or skin  necrosis: No Has patient had a PCN reaction that required hospitalization No Has patient had a PCN reaction occurring within the last 10 years: No If all of the above answers are "NO", then may proceed with Cephalosporin use.  Marland Kitchen Zetia [Ezetimibe] Other (See Comments)    Reaction:  Muscle weakness   . Zocor [Simvastatin] Nausea Only and Other (See Comments)    Reaction:  Muscle weakness

## 2017-02-27 NOTE — Telephone Encounter (Signed)
Pt is aware of below message and voiced his understanding.  Rx for Zpak has been sent to preferred pharmacy. Nothing further is needed.

## 2017-02-28 HISTORY — PX: MELANOMA EXCISION: SHX5266

## 2017-03-02 LAB — FUNGUS CULTURE WITH STAIN

## 2017-03-02 LAB — FUNGUS CULTURE RESULT

## 2017-03-02 LAB — FUNGAL ORGANISM REFLEX

## 2017-03-06 ENCOUNTER — Other Ambulatory Visit: Payer: Self-pay | Admitting: Family Medicine

## 2017-03-06 ENCOUNTER — Other Ambulatory Visit: Payer: Self-pay | Admitting: Internal Medicine

## 2017-03-06 ENCOUNTER — Other Ambulatory Visit: Payer: Self-pay | Admitting: Endocrinology

## 2017-03-16 ENCOUNTER — Other Ambulatory Visit: Payer: Self-pay | Admitting: Surgery

## 2017-03-16 DIAGNOSIS — I712 Thoracic aortic aneurysm, without rupture, unspecified: Secondary | ICD-10-CM

## 2017-03-18 LAB — ACID FAST CULTURE WITH REFLEXED SENSITIVITIES (MYCOBACTERIA): Acid Fast Culture: NEGATIVE

## 2017-03-23 ENCOUNTER — Encounter: Payer: Self-pay | Admitting: Family Medicine

## 2017-03-23 ENCOUNTER — Ambulatory Visit (INDEPENDENT_AMBULATORY_CARE_PROVIDER_SITE_OTHER): Payer: Medicare Other | Admitting: Family Medicine

## 2017-03-23 VITALS — BP 129/71 | HR 72 | Temp 96.5°F | Ht 67.0 in | Wt 153.2 lb

## 2017-03-23 DIAGNOSIS — J42 Unspecified chronic bronchitis: Secondary | ICD-10-CM

## 2017-03-23 DIAGNOSIS — E221 Hyperprolactinemia: Secondary | ICD-10-CM | POA: Diagnosis not present

## 2017-03-23 DIAGNOSIS — E559 Vitamin D deficiency, unspecified: Secondary | ICD-10-CM

## 2017-03-23 DIAGNOSIS — R9389 Abnormal findings on diagnostic imaging of other specified body structures: Secondary | ICD-10-CM

## 2017-03-23 DIAGNOSIS — I712 Thoracic aortic aneurysm, without rupture: Secondary | ICD-10-CM

## 2017-03-23 DIAGNOSIS — E034 Atrophy of thyroid (acquired): Secondary | ICD-10-CM

## 2017-03-23 DIAGNOSIS — I7121 Aneurysm of the ascending aorta, without rupture: Secondary | ICD-10-CM

## 2017-03-23 DIAGNOSIS — I700 Atherosclerosis of aorta: Secondary | ICD-10-CM

## 2017-03-23 DIAGNOSIS — I739 Peripheral vascular disease, unspecified: Secondary | ICD-10-CM | POA: Diagnosis not present

## 2017-03-23 DIAGNOSIS — I4891 Unspecified atrial fibrillation: Secondary | ICD-10-CM | POA: Diagnosis not present

## 2017-03-23 DIAGNOSIS — K219 Gastro-esophageal reflux disease without esophagitis: Secondary | ICD-10-CM

## 2017-03-23 NOTE — Patient Instructions (Addendum)
Continue current medications. Continue good therapeutic lifestyle changes which include good diet and exercise. Fall precautions discussed with patient. If an FOBT was given today- please return it to our front desk. If you are over 82 years old - you may need Prevnar 78 or the adult Pneumonia vaccine.  **Flu shots are available--- please call and schedule a FLU-CLINIC appointment**  After your visit with [place] today you will receive a survey in the mail or online from [employer] regarding your care with [place]. Please take a moment to fill this out. Your feedback is very important to [place] as you can help [place] better understand your patient needs as well as improve your experience and satisfaction. WE CARE ABOUT YOU!!!   Continue with current treatment Follow-up with pulmonology and vascular surgery as planned Get CT scan as planned Avoid soaps fabric softeners and detergents that are scented Dove soap and all detergent would be preferred. Continue to follow-up with cardiology and gastroenterology. The patient should keep the house as cool as possible He should practice deep breathing several times daily He should take Mucinex maximum strength, playing, blue and white in color and take 1 twice a day with a large glass of water on a regular basis and indefinitely.

## 2017-03-23 NOTE — Addendum Note (Signed)
Addended by: Marin Olp on: 03/23/2017 04:52 PM   Modules accepted: Orders

## 2017-03-23 NOTE — Progress Notes (Signed)
Subjective:    Patient ID: [name], male    DOB: [birth year], 84 y.o.   MRN: [id number]  HPI  Pt is here for follow up of chronic medical conditions which include aneurysm, COPD, hyperthyroidism, GERD and recheck pneumonia.  This patient has a history of an ascending aortic aneurysm and recurrent pneumonia.  He has an appointment with the vascular surgeon again soon to follow-up on the aneurysm and he is being followed regularly by the pulmonologist because of the recurrent pneumonia.  The notes read that he may be having aspiration pneumonia.  He is currently taking a proton pump inhibitor plus an H2 blocker.  He will get lab work today.  He tells the nurse that he is scheduled for CT on February 27.  He is seen regularly by the urologist.  The patient today says he does have occasional chest pain and he knows that on one occasion it lasted for about 15 minutes and was related to exercise.  He is not having this any more frequently than usual.  He denies any problems with shortness of breath other than his usual issues with his COPD and cough and congestion.  He denies any trouble with swallowing and as long as he is taking his Carafate and Protonix regularly he does not have any heartburn.  He tries to chew his food regularly and not eat late at night.  He also has the head of the bed elevated 6 inches.  He is voiding well though slowly.  He does see the urologist, Dr. Noah Delaine regularly.        Review of Systems  Constitutional: Positive for activity change (slowly increasing exercise).  HENT: Positive for congestion (continued, slight).   Respiratory: Positive for cough (intermitent, occasionally prod (clear)) and wheezing (intermitent).   Genitourinary: Positive for difficulty urinating.  Musculoskeletal: Positive for back pain (cervical, ongoing).  Skin: Positive for rash (intermitent itching, hands, legs, abdomin).  Neurological: Positive for headaches (occasional).         Objective:   Physical Exam  Constitutional: He is oriented to person, place, and time. He appears well-developed and well-nourished. No distress.  Patient is pleasant and in good spirits and trying to do all the things at all of his doctors have asked him to do.  HENT:  Head: Normocephalic and atraumatic.  Right Ear: External ear normal.  Left Ear: External ear normal.  Nose: Nose normal.  Mouth/Throat: Oropharynx is clear and moist. No oropharyngeal exudate.  Eyes: Conjunctivae and EOM are normal. Pupils are equal, round, and reactive to light. Right eye exhibits no discharge. Left eye exhibits no discharge. No scleral icterus.  Neck: Normal range of motion. Neck supple. No thyromegaly present.  No bruits thyromegaly or anterior cervical adenopathy  Cardiovascular: Normal rate, regular rhythm and normal heart sounds.  No murmur heard. Heart today is regular at 72/min pedal pulses were difficult to palpate.  Pulmonary/Chest: Effort normal. No respiratory distress. He has wheezes. He has no rales. He exhibits no tenderness.  Patient has a few crackles with deep inspiration and expiration.  These are generalized throughout both lungs.  With deep breathing some of these appear to clear out.  Abdominal: Soft. Bowel sounds are normal. He exhibits no mass. There is no tenderness. There is no rebound and no guarding.  No abdominal tenderness masses bruits or organ enlargement  Genitourinary:  Genitourinary Comments: The patient is wearing a depends and he does see the urologist, Dr. Alyson Ingles regularly.  Musculoskeletal:  Normal range of motion. He exhibits deformity. He exhibits no edema.  The patient is very flat footed and he has deformities of both feet.  Lymphadenopathy:    He has no cervical adenopathy.  Neurological: He is alert and oriented to person, place, and time. He has normal reflexes. No cranial nerve deficit.  Skin: Skin is warm and dry. No rash noted.  Psychiatric: He has a  normal mood and affect. His behavior is normal. Judgment and thought content normal.  Nursing note and vitals reviewed.  .BP 129/71 (BP Location: Left Arm, Patient Position: Sitting, Cuff Size: Normal)   Pulse 72   Temp (!) 96.5 F (35.8 C) (Oral)   Ht 5\' 7"  (1.702 m)   Wt 153 lb 3.2 oz (69.5 kg)   BMI 23.99 kg/m         Assessment & Plan:  1. Thoracic ascending aortic aneurysm [facility]) -Follow-up with vascular surgeon and get CT scan as planned later in February  2. Aortic atherosclerosis ([place]) -Continue with aggressive therapeutic lifestyle changes and follow-up with vascular surgeon and cardiologist as planned  3. Gastroesophageal reflux disease, esophagitis presence not specified -Continue with aggressive management of this using Protonix and Carafate as directed by gastroenterology  4. Hypothyroidism due to acquired atrophy of thyroid -Continue current treatment pending results of lab work  5. Vitamin D deficiency -Continue current treatment pending results of lab work  6. Abnormal chest x-ray -The patient has had recurrent bouts of pneumonia and also has an ascending repeat CT scan the end of February  7. Hyperprolactinemia ([place]) -Continue to follow-up with neurosurgery as planned  8. Atrial fibrillation with RVR ([place]) -Patient was in normal sinus rhythm today at 72/min and he will continue to follow-up with cardiology.  9. Peripheral vascular insufficiency ([facility]) -Diminished pedal pulses without complaints of claudication  10. Chronic bronchitis, unspecified chronic bronchitis type ([place]) -Continue to follow-up with pulmonology use inhalers regularly take antireflux medications regularly and use Mucinex regularly  Patient Instructions  Continue current medications. Continue good therapeutic lifestyle changes which include good diet and exercise. Fall precautions discussed with patient. If an FOBT was given today- please return it to our front desk. If you are  over 6 years old - you may need Prevnar 59 or the adult Pneumonia vaccine.  **Flu shots are available--- please call and schedule a FLU-CLINIC appointment**  After your visit with [place] today you will receive a survey in the mail or online from [employer] regarding your care with [place]. Please take a moment to fill this out. Your feedback is very important to [place] as you can help [place] better understand your patient needs as well as improve your experience and satisfaction. WE CARE ABOUT YOU!!!   Continue with current treatment Follow-up with pulmonology and vascular surgery as planned Get CT scan as planned Avoid soaps fabric softeners and detergents that are scented Dove soap and all detergent would be preferred. Continue to follow-up with cardiology and gastroenterology. The patient should keep the house as cool as possible He should practice deep breathing several times daily He should take Mucinex maximum strength, playing, blue and white in color and take 1 twice a day with a large glass of water on a regular basis and indefinitely.   Arrie Senate MD

## 2017-03-24 LAB — BASIC METABOLIC PANEL
BUN/Creatinine Ratio: 13 (ref 10–24)
BUN: 13 mg/dL (ref 8–27)
CO2: 25 mmol/L (ref 20–29)
Calcium: 9.1 mg/dL (ref 8.6–10.2)
Chloride: 103 mmol/L (ref 96–106)
Creatinine, Ser: 0.99 mg/dL (ref 0.76–1.27)
GFR calc Af Amer: 82 mL/min/{1.73_m2} (ref >59)
GFR calc non Af Amer: 71 mL/min/{1.73_m2} (ref >59)
Glucose: 87 mg/dL (ref 65–99)
Potassium: 4.1 mmol/L (ref 3.5–5.2)
Sodium: 141 mmol/L (ref 134–144)

## 2017-03-24 LAB — CBC WITH DIFFERENTIAL/PLATELET
Basophils Absolute: 0.1 x10E3/uL (ref 0.0–0.2)
Basos: 1 %
EOS (ABSOLUTE): 0.3 x10E3/uL (ref 0.0–0.4)
Eos: 3 %
Hematocrit: 37.5 % (ref 37.5–51.0)
Hemoglobin: 12.5 g/dL — ABNORMAL LOW (ref 13.0–17.7)
Immature Grans (Abs): 0.3 x10E3/uL — ABNORMAL HIGH (ref 0.0–0.1)
Immature Granulocytes: 4 %
Lymphocytes Absolute: 1.4 x10E3/uL (ref 0.7–3.1)
Lymphs: 16 %
MCH: 32.6 pg (ref 26.6–33.0)
MCHC: 33.3 g/dL (ref 31.5–35.7)
MCV: 98 fL — ABNORMAL HIGH (ref 79–97)
Monocytes Absolute: 1.2 x10E3/uL — ABNORMAL HIGH (ref 0.1–0.9)
Monocytes: 14 %
Neutrophils Absolute: 5.3 x10E3/uL (ref 1.4–7.0)
Neutrophils: 62 %
Platelets: 252 x10E3/uL (ref 150–379)
RBC: 3.83 x10E6/uL — ABNORMAL LOW (ref 4.14–5.80)
RDW: 15.7 % — ABNORMAL HIGH (ref 12.3–15.4)
WBC: 8.5 x10E3/uL (ref 3.4–10.8)

## 2017-03-24 LAB — LIPID PANEL
Chol/HDL Ratio: 3.2 ratio (ref 0.0–5.0)
Cholesterol, Total: 192 mg/dL (ref 100–199)
HDL: 60 mg/dL
LDL Calculated: 118 mg/dL — ABNORMAL HIGH (ref 0–99)
Triglycerides: 68 mg/dL (ref 0–149)
VLDL Cholesterol Cal: 14 mg/dL (ref 5–40)

## 2017-03-24 LAB — HEPATIC FUNCTION PANEL
ALT: 16 IU/L (ref 0–44)
AST: 20 IU/L (ref 0–40)
Albumin: 3.7 g/dL (ref 3.5–4.7)
Alkaline Phosphatase: 76 IU/L (ref 39–117)
Bilirubin Total: 0.3 mg/dL (ref 0.0–1.2)
Bilirubin, Direct: 0.1 mg/dL (ref 0.00–0.40)
Total Protein: 6.8 g/dL (ref 6.0–8.5)

## 2017-04-07 ENCOUNTER — Other Ambulatory Visit: Payer: Self-pay | Admitting: *Deleted

## 2017-04-07 ENCOUNTER — Other Ambulatory Visit: Payer: Self-pay | Admitting: Internal Medicine

## 2017-04-07 MED ORDER — PANTOPRAZOLE SODIUM 40 MG PO TBEC: DELAYED_RELEASE_TABLET | ORAL | 0 refills | Status: DC

## 2017-04-11 DIAGNOSIS — L989 Disorder of the skin and subcutaneous tissue, unspecified: Secondary | ICD-10-CM | POA: Diagnosis not present

## 2017-04-11 DIAGNOSIS — L570 Actinic keratosis: Secondary | ICD-10-CM | POA: Diagnosis not present

## 2017-04-11 DIAGNOSIS — L439 Lichen planus, unspecified: Secondary | ICD-10-CM | POA: Diagnosis not present

## 2017-04-11 DIAGNOSIS — Z85828 Personal history of other malignant neoplasm of skin: Secondary | ICD-10-CM | POA: Diagnosis not present

## 2017-04-11 DIAGNOSIS — L820 Inflamed seborrheic keratosis: Secondary | ICD-10-CM | POA: Diagnosis not present

## 2017-04-11 DIAGNOSIS — D485 Neoplasm of uncertain behavior of skin: Secondary | ICD-10-CM | POA: Diagnosis not present

## 2017-04-17 ENCOUNTER — Encounter: Payer: Self-pay | Admitting: Family Medicine

## 2017-04-19 ENCOUNTER — Encounter: Payer: Self-pay | Admitting: Family Medicine

## 2017-04-25 ENCOUNTER — Encounter: Payer: Self-pay | Admitting: Family Medicine

## 2017-04-26 ENCOUNTER — Encounter: Payer: Self-pay | Admitting: Surgery

## 2017-04-26 ENCOUNTER — Ambulatory Visit
Admission: RE | Admit: 2017-04-26 | Discharge: 2017-04-26 | Disposition: A | Discharge status: Home / Self Care | Payer: Medicare Other | Source: Ambulatory Visit | Attending: Surgery | Admitting: Surgery

## 2017-04-26 ENCOUNTER — Ambulatory Visit: Payer: Medicare Other | Admitting: Surgery

## 2017-04-26 VITALS — BP 132/74 | HR 79 | Resp 20 | Ht 67.0 in | Wt 153.0 lb

## 2017-04-26 DIAGNOSIS — I712 Thoracic aortic aneurysm, without rupture, unspecified: Secondary | ICD-10-CM

## 2017-04-26 NOTE — Progress Notes (Signed)
HPI:  The patient returns today for follow-up of a 4.2 cm fusiform ascending aortic aneurysm that has been noted on CT scans dating back to 2009 at which time it measured 4.4 cm.  It is measured between 4.4 and 4.5 cm on multiple prior scans over the years.  He had a previous echo in 05/2015 that showed a trileaflet aortic valve with no aortic insufficiency.  He has been having some productive cough and is currently being treated for bronchitis with doxycycline.  Current Outpatient Medications  Medication Sig Dispense Refill  . albuterol (PROVENTIL HFA;VENTOLIN HFA) 108 (90 Base) MCG/ACT inhaler Inhale 2 puffs into the lungs every 6 (six) hours as needed for wheezing or shortness of breath. 18 g 11  . bisacodyl (DULCOLAX) 10 MG suppository Place 10 mg rectally as needed for moderate constipation.    . bromocriptine (PARLODEL) 2.5 MG tablet Take 2.5 mg by mouth 2 (two) times daily.    . calcium carbonate (OSCAL) 1500 (600 Ca) MG TABS tablet Take 600 mg of elemental calcium by mouth daily with breakfast.     . cholecalciferol (VITAMIN D) 1000 UNITS tablet Take 1,000 Units by mouth daily.     Marland Kitchen diltiazem (CARDIZEM CD) 120 MG 24 hr capsule TAKE (1) CAPSULE DAILY 90 capsule 0  . doxycycline (VIBRA-TABS) 100 MG tablet Take 1 tablet (100 mg total) by mouth 2 (two) times daily. 20 tablet 0  . ferrous sulfate 325 (65 FE) MG tablet Take 325 mg by mouth daily with breakfast.     . guaiFENesin (MUCINEX) 600 MG 12 hr tablet Take 600 mg by mouth daily.     Marland Kitchen levothyroxine (SYNTHROID, LEVOTHROID) 50 MCG tablet TAKE 1 TABLET IN MORNING 30 tablet 0  . magnesium oxide (MAG-OX) 400 (241.3 Mg) MG tablet Take 400 mg by mouth every other day.    . montelukast (SINGULAIR) 10 MG tablet Take 10 mg by mouth at bedtime as needed (for allergies).     . naphazoline-pheniramine (NAPHCON-A) 0.025-0.3 % ophthalmic solution Place 1 drop into both eyes 2 (two) times daily as needed for irritation or allergies.     .  nitroGLYCERIN (NITROSTAT) 0.4 MG SL tablet Place 1 tablet (0.4 mg total) under the tongue every 5 (five) minutes as needed for chest pain. 25 tablet 2  . pantoprazole (PROTONIX) 40 MG tablet TAKE (1) TABLET TWICE A DAY BEFORE MEALS. 180 tablet 0  . polyethylene glycol (MIRALAX / GLYCOLAX) packet Take 17 g by mouth daily. (Patient taking differently: Take 17 g by mouth daily as needed for moderate constipation. ) 30 each 3  . Respiratory Therapy Supplies (FLUTTER) DEVI 1 Device by Does not apply route as needed. 1 each 0  . sucralfate (CARAFATE) 1 g tablet TAKE (1) TABLET FOUR TIMES DAILY AS NEEDED FOR GI UPSET (Patient taking differently: TAKE (1) TABLET FOUR TIMES DAILY) 120 tablet 1  . SYMBICORT 160-4.5 MCG/ACT inhaler Inhale 2 puffs into the lungs 2 (two) times daily. 10.2 g 5  . vitamin B-12 (CYANOCOBALAMIN) 1000 MCG tablet Take 1,000 mcg by mouth daily.    . vitamin E (VITAMIN E) 400 UNIT capsule Take 400 Units by mouth daily.     No current facility-administered medications for this visit.      Physical Exam: BP 132/74   Pulse 79   Resp 20   Ht 5\' 7"  (1.702 m)   Wt 153 lb (69.4 kg)   SpO2 98% Comment: RA  BMI 23.96 kg/m  He looks well. Cardiac exam shows a regular rate and rhythm with normal heart sounds.  There is no murmur. Lungs reveal a few rhonchi in the lung bases.   Diagnostic Tests:  CLINICAL DATA:  Cough and shortness of breath. Follow-up ascending thoracic aortic aneurysm and right lower lobe lung nodules.  EXAM: CT CHEST WITHOUT CONTRAST  TECHNIQUE: Multidetector CT imaging of the chest was performed following the standard protocol without IV contrast.  COMPARISON:  11/21/2016, 05/20/2016, 03/29/2016 and chest radiographs dated 01/02/2017.  FINDINGS: Cardiovascular: The ascending thoracic aorta remains dilated, measuring 4.5 cm in maximum diameter on image number 71 of series 2, previously 4.4 cm. Atheromatous calcifications, including the coronary  arteries and aorta. Normal sized heart.  Mediastinum/Nodes: No enlarged mediastinal or axillary lymph nodes. Thyroid gland, trachea, and esophagus demonstrate no significant findings.  Lungs/Pleura: A previously demonstrated 5 mm subpleural nodule in the left lower lobe measures 4 mm on image number 107 of series 4. A previously demonstrated subpleural lymph node in the right lower lobe, along the major fissure, is unchanged, measuring 6 mm on image number 84 of series 4. Small calcified granulomata are again demonstrated in the right lower lobe. Mucous plugging in the right lower lobe bronchi is again demonstrated. Mild peripheral right lower lobe cylindrical bronchiectasis. Mildly progressive patchy and small peripheral nodular opacities in the right lower lobe. Previously noted bilateral pleural calcifications. No pleural fluid.  Upper Abdomen: Post gastric bypass changes.  Musculoskeletal: Thoracic and lower cervical spine degenerative changes. Stable mild thoracic vertebral compression deformities.  IMPRESSION: 1. Mildly progressive chronic inflammatory/infectious changes in the right lower lobe with mucous plugging. 2. Stable small left lower lobe subpleural nodule. 3. Minimally increased size of the previously demonstrated ascending thoracic aortic aneurysm, measuring 4.5 cm in maximum diameter today. Ascending thoracic aortic aneurysm. Recommend semi-annual imaging followup by CTA or MRA. This recommendation follows 2010 ACCF/AHA/AATS/ACR/ASA/SCA/SCAI/SIR/STS/SVM Guidelines for the Diagnosis and Management of Patients With Thoracic Aortic Disease. Circulation. 2010; 121: H417-E081 4. Bilateral pleural calcifications compatible with previous asbestos exposure. 5.  Calcific coronary artery and aortic atherosclerosis.  Aortic Atherosclerosis (ICD10-I70.0).   Electronically Signed   By: Claudie Revering M.D.   On: 04/26/2017 15:18   Impression:  The fusiform  ascending aortic aneurysm has increased minimally in size to 4.5 cm.  His last CT scan on 11/21/2016 measured at 4.4 cm.  This is still well below the surgical threshold of 5.5 cm.  His blood pressure is under adequate control.  The CT scan today does show mildly progressive chronic inflammatory or infectious changes in the right lower lobe with some mucus plugging consistent with his productive cough which she has been treated with doxycycline.  I have recommended that he have a repeat CT scan of the chest without contrast in 1 year to follow-up on the ascending aortic aneurysm.  This is been fairly stable over the years and I do not see any need to repeat the scans more than yearly at this point.  I reviewed the CT images with him and his friend.  I have answered all of their questions.  Plan:  He will continue to follow-up with Dr. Laurance Flatten for his medical care and I will plan to see him back in 1 year with a CT scan of the chest without contrast.  I spent 15 minutes performing this established patient evaluation and > 50% of this time was spent face to face counseling and coordinating the care of this patient's aortic aneurysm.  Gaye Pollack, MD [facility] [phone]

## 2017-04-27 ENCOUNTER — Encounter: Payer: Self-pay | Admitting: Internal Medicine

## 2017-04-27 ENCOUNTER — Encounter: Payer: Self-pay | Admitting: Surgery

## 2017-04-27 ENCOUNTER — Encounter: Payer: Self-pay | Admitting: Family Medicine

## 2017-04-27 MED ORDER — AZITHROMYCIN 250 MG PO TABS: ORAL_TABLET | ORAL | 0 refills | Status: DC

## 2017-04-27 NOTE — Telephone Encounter (Signed)
Pt aware and med sent to pharm 

## 2017-04-27 NOTE — Telephone Encounter (Signed)
Appt scheduled Nothing further needed; will sign off

## 2017-04-27 NOTE — Telephone Encounter (Signed)
Please see e-mail from patient: Appt recommended as last ov was 12.4.18 - MW with openings tomorrow and Monday Also asked patient for additional information on symptoms

## 2017-04-28 ENCOUNTER — Ambulatory Visit: Payer: Medicare Other | Admitting: Internal Medicine

## 2017-04-28 ENCOUNTER — Encounter: Payer: Self-pay | Admitting: Internal Medicine

## 2017-04-28 VITALS — BP 110/62 | HR 73 | Temp 97.0°F | Ht 67.0 in | Wt 156.4 lb

## 2017-04-28 DIAGNOSIS — R9389 Abnormal findings on diagnostic imaging of other specified body structures: Secondary | ICD-10-CM

## 2017-04-28 DIAGNOSIS — R053 Chronic cough: Secondary | ICD-10-CM

## 2017-04-28 DIAGNOSIS — R059 Cough, unspecified: Secondary | ICD-10-CM | POA: Insufficient documentation

## 2017-04-28 DIAGNOSIS — R05 Cough: Secondary | ICD-10-CM

## 2017-04-28 DIAGNOSIS — J449 Chronic obstructive pulmonary disease, unspecified: Secondary | ICD-10-CM

## 2017-04-28 MED ORDER — PREDNISONE 10 MG PO TABS: ORAL_TABLET | ORAL | 0 refills | Status: DC

## 2017-04-28 MED ORDER — AZITHROMYCIN 250 MG PO TABS: ORAL_TABLET | ORAL | 11 refills | Status: DC

## 2017-04-28 NOTE — Patient Instructions (Addendum)
Please see patient coordinator before you leave today  to schedule sinus CT   For nasty mucus >  zpak    Prednisone 10 mg take  4 each am x 2 days,   2 each am x 2 days,  1 each am x 2 days and stop   Work on inhaler technique:  relax and gently blow all the way out then take a nice smooth deep breath back in, triggering the inhaler at same time you start breathing in.  Hold for up to 5 seconds if you can. Blow out thru nose. Rinse and gargle with water when done      Please schedule a follow up visit in 3 months but call sooner if needed with pfts  - if sinus ct neg needs DgEs and alpha one screening at next ov

## 2017-04-28 NOTE — Progress Notes (Signed)
Subjective:   Patient ID: [name], male    DOB: 1932-05-13   MRN: [id number]    Brief patient profile:  84   yowm quit smoking completely around 1980 (relatively light) with cough and short of breath eval by  Dr Eliberto Ivory  Then [name] dx of copd/ cb  pft's showed fev1 116% 02/2010 though ratio 66 c/w GOLD I criteria       History of Present Illness  07/05/2010 ov cc recurrent pna's since June 2011 cc not back to baseline in terms of activities he enjoyed in May, for example  Could do some yardwork  and rarely  Needed saba and no need for any maint rx,   but on  spriva for sob and still frequent tightness generalized front more than back,  Equal both sides  low grade fever and mucus gets thick and yellow> admit to [facility] April 24-27 by [facility] dx of ? Pna.   No sob at rest.   Continue protonix 40 mg  Take 30-60 min before first meal of the day and Pepcid 20 mg at bedtime GERD (REFLUX) diet Try symbicort 160 Take 2 puffs bid and work on hfa technique    08/24/2010 ov/[facility]/[name] cc breathing and cough better despite L rib fx 07/22/10. Main concern is how to prevent future exacerbations.  No limiting sob but not as active since quit smoking. Doing so much better overall rec  Ok to stop spiriva unless it reduces your desired activity performance (it's like high octane fuel for your lungs so your can be all you can be) Continue symbicort 160 Take 2 puffs first thing in am and then another 2 puffs about 12 hours later.   In event of recurrent signs of infection:  Change in sputum, fever, cough, short of breath or sore throat go ahead and take a course of doxycycline - if this doesn't help we need to see you in [place] ([facility]/ [name]) F/u prn      01/31/2017  f/u ov/[facility] re:  Copd 1/ chronic bronchitis / ? Prev aspiraton with abnormal cT chest  ? obst RLL  Chief Complaint  Patient presents with  . Follow-up    Pt states he has had "a touch of PNA". He has been coughing since the beginning on  Nov 2018.  He has taken doxy and levaquin.  He is coughing up greyish white sputum.  He has an albuterol inhaler that he rarely uses.   swallowing better now but still very congested cough esp since early nov 2018  Not using flutter valve as rec  hfa has improved but not yet adequate Still some hb/ sore throat symptoms despite reporting ppi bid  Also still some noct cough sev hours p lie down despite using 6 inch blocks rec FOB 02/01/17> tenacious secretions> nl flora/ no tb, no fungus on culture    04/28/2017  f/u ov/[facility] re: f/u COPD / cb abn ct again Chief Complaint  Patient presents with  . Acute Visit    Increased cough x 10 days- prod with white to grey sputum. He started on Doxy per Dr Laurance Flatten- has one dose remaining. He states his breathing is unchanged since his last visit. He rarely uses his albuterol inhaler.   Had CT Chest done 04/26/17.    did better p first few days p fob until the end Dec nasty mucus, more sob > doxy then added zpak then better again then worse again last week if Feb restarted doxy  and now on  9/10 on  ct chest 04/26/17 obst again on w/u for TAA Doe = MMRC1 = can walk nl pace, flat grade, can't hurry or go uphills or steps s sob   Sleeps ok but quit a bit of am congestion /mucus still grey    No obvious day to day or daytime variability or assoc   mucus plugs or hemoptysis or cp or chest tightness, subjective wheeze or overt sinus or hb symptoms. No unusual exposure hx or h/o childhood pna/ asthma or knowledge of premature birth.  Sleeping ok flat without nocturnal  or early am exacerbation  of respiratory  c/o's or need for noct saba. Also denies any obvious fluctuation of symptoms with weather or environmental changes or other aggravating or alleviating factors except as outlined above   Current Allergies, Complete Past Medical History, Past Surgical History, Family History, and Social History were reviewed in [employer] record.  ROS  The  following are not active complaints unless bolded Hoarseness, sore throat, dysphagia, dental problems, itching, sneezing,  nasal congestion or discharge of excess mucus or purulent secretions, ear ache,   fever, chills, sweats, unintended wt loss or wt gain, classically pleuritic or exertional cp,  orthopnea pnd or leg swelling, presyncope, palpitations, abdominal pain, anorexia, nausea, vomiting, diarrhea  or change in bowel habits or change in bladder habits, change in stools or change in urine, dysuria, hematuria,  rash, arthralgias, visual complaints, headache, numbness, weakness or ataxia or problems with walking or coordination,  change in mood/affect or memory.        Current Meds  Medication Sig  . albuterol (PROVENTIL HFA;VENTOLIN HFA) 108 (90 Base) MCG/ACT inhaler Inhale 2 puffs into the lungs every 6 (six) hours as needed for wheezing or shortness of breath.  . bisacodyl (DULCOLAX) 10 MG suppository Place 10 mg rectally as needed for moderate constipation.  . bromocriptine (PARLODEL) 2.5 MG tablet Take 2.5 mg by mouth 2 (two) times daily.  . calcium carbonate (OSCAL) 1500 (600 Ca) MG TABS tablet Take 600 mg of elemental calcium by mouth daily with breakfast.   . cholecalciferol (VITAMIN D) 1000 UNITS tablet Take 1,000 Units by mouth daily.   Marland Kitchen diltiazem (CARDIZEM CD) 120 MG 24 hr capsule TAKE (1) CAPSULE DAILY  . doxycycline (VIBRA-TABS) 100 MG tablet Take 1 tablet (100 mg total) by mouth 2 (two) times daily.  . ferrous sulfate 325 (65 FE) MG tablet Take 325 mg by mouth daily with breakfast.   . guaiFENesin (MUCINEX) 600 MG 12 hr tablet Take 600 mg by mouth daily.   Marland Kitchen levothyroxine (SYNTHROID, LEVOTHROID) 50 MCG tablet TAKE 1 TABLET IN MORNING  . magnesium oxide (MAG-OX) 400 (241.3 Mg) MG tablet Take 400 mg by mouth every other day.  . montelukast (SINGULAIR) 10 MG tablet Take 10 mg by mouth at bedtime as needed (for allergies).   . naphazoline-pheniramine (NAPHCON-A) 0.025-0.3 %  ophthalmic solution Place 1 drop into both eyes 2 (two) times daily as needed for irritation or allergies.   . nitroGLYCERIN (NITROSTAT) 0.4 MG SL tablet Place 1 tablet (0.4 mg total) under the tongue every 5 (five) minutes as needed for chest pain.  . pantoprazole (PROTONIX) 40 MG tablet TAKE (1) TABLET TWICE A DAY BEFORE MEALS.  Marland Kitchen polyethylene glycol (MIRALAX / GLYCOLAX) packet Take 17 g by mouth daily. (Patient taking differently: Take 17 g by mouth daily as needed for moderate constipation. )  . Respiratory Therapy Supplies (FLUTTER) DEVI 1 Device  by Does not apply route as needed.  . sucralfate (CARAFATE) 1 g tablet TAKE (1) TABLET FOUR TIMES DAILY AS NEEDED FOR GI UPSET (Patient taking differently: TAKE (1) TABLET FOUR TIMES DAILY)  . SYMBICORT 160-4.5 MCG/ACT inhaler Inhale 2 puffs into the lungs 2 (two) times daily.  . vitamin B-12 (CYANOCOBALAMIN) 1000 MCG tablet Take 1,000 mcg by mouth daily.  . vitamin E (VITAMIN E) 400 UNIT capsule Take 400 Units by mouth daily.                    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 Bonchiectasis RLL- CT 2009 > not verified on CT 07/22/10   Past Surgical History:  Inguinal herniorrhaphy  PTCA/stent  LEFT & RIGHT FOOT RECONSTRUCTION  hiatal hernia repair 2009   Family History:  neg for pituitary dz  mother died age 59 from phlebitis  father died age 40 from heart failure  8 siblings  alive age 39,80,89,92  1 died age 106 heart failure  3 died age 15,76,90 from  heart problems    Social History:  married  2 children  retiired  never smoked  no etoh         Objective:   Physical Exam   Pleasant amb wm nad/ slt nasal tone to voice    wt 167 07/05/2010  > 168 08/24/2010 > 04/05/2011 174> 07/12/2011  176> 09/24/2012  169 > 02/25/2013  175 >  01/31/2017   150 > 04/28/2017   156     06/13/16 153 lb 12.8 oz (69.8 kg)  05/10/16 155 lb (70.3 kg)  05/06/16 154 lb (69.9 kg)     Vital signs reviewed - Note on arrival 02 sats  96% on RA      HEENT: nl dentition, turbinates bilaterally, and oropharynx. Nl external ear canals without cough reflex   NECK :  without JVD/Nodes/TM/ nl carotid upstrokes bilaterally   LUNGS: no acc muscle use,  Nl contour chest with mid exp rhonchi bilaterally / no localized wheezing    CV:  RRR  no s3 or murmur or increase in P2, and no edema   ABD:  soft and nontender with nl inspiratory excursion in the supine position. No bruits or organomegaly appreciated, bowel sounds nl  MS:  Nl gait/ ext warm without deformities, calf tenderness, cyanosis or clubbing No obvious joint restrictions   SKIN: warm and dry without lesions    NEURO:  alert, approp, nl sensorium with  no motor or cerebellar deficits apparent.           Assessment & Plan:

## 2017-04-29 ENCOUNTER — Encounter: Payer: Self-pay | Admitting: Internal Medicine

## 2017-04-29 NOTE — Assessment & Plan Note (Addendum)
CT w/o contrast Chest  05/20/16 1. Some clearing of peripheral consolidation in the right lower lobe. There remains slight dilatation of right lower lobe bronchi with intraluminal material in the right lower lobe bronchi. There remains slightly nodular appearing infiltrates within the right lower lobe. 2. Resolving inflammatory foci in the right middle lobe. 3. Stable 5 mm left lower lobe subpleural nodule. 4. Aneurysmal dilatation of the ascending aorta up to 4.4 cm.    Flutter valve added to rx 06/13/2016 > not using 01/31/2017 Ct chest 11/21/16 Re- demonstrated material within the right lower lobe bronchi most suggestive of aspirated material. Slight interval increase in subpleural consolidation within the medial right lower lobe which may represent combination of aspiration, infection and/or scarring FOB 02/01/17> tenacious secretions> nl flora/ no tb, no fungus on culture  Repeat CT 04/26/17  Mildly progressive chronic inflammatory/infectious changes in the lower lobe with mucous plugging.  Suspect he has evolving bronchiectasis so rec - retrain on Flutter/ mdi - sinus Ct  - if sinus ct neg needs DgEs and alpha one screening at next ov with pfts on return   Discussed in detail all the  indications, usual  risks and alternatives  relative to the benefits with patient who agrees to proceed with w/u as outlined.

## 2017-04-29 NOTE — Assessment & Plan Note (Signed)
-   Spirometry 03/30/2010 FEV1  2.86 (116%) ratio 66     - 06/13/2016  After extensive coaching HFA effectiveness =    75% from a baseline of 50%    - 01/31/2017  After extensive coaching HFA effectiveness =    75% at baseline  - FOB 02/01/2017  Copious retained secretions  - Flutter valve re-training 04/28/2017  - 04/28/2017  After extensive coaching inhaler device  effectiveness =    75%   Continues to have very poorly controlled mucociliary dysfunction out of proportion to airflow obst and suggestive of chronic / recurrent asp or sinus dz   rec add zmax as new prn abx and proceed with sinus CT then swallowing eval next.   I had an extended discussion with the patient and his companion Investment banker, corporate)  reviewing all relevant studies completed to date and  lasting 15 to 20 minutes of a 25 minute visit    Each maintenance medication was reviewed in detail including most importantly the difference between maintenance and prns and under what circumstances the prns are to be triggered using an action plan format that is not reflected in the computer generated alphabetically organized AVS.    Please see AVS for specific instructions unique to this visit that I personally wrote and verbalized to the the pt in detail and then reviewed with pt  by my nurse highlighting any  changes in therapy recommended at today's visit to their plan of care.

## 2017-04-29 NOTE — Assessment & Plan Note (Signed)
Sinus CT 04/29/2017 >>>

## 2017-05-01 DIAGNOSIS — L905 Scar conditions and fibrosis of skin: Secondary | ICD-10-CM | POA: Diagnosis not present

## 2017-05-01 DIAGNOSIS — D0359 Melanoma in situ of other part of trunk: Secondary | ICD-10-CM | POA: Diagnosis not present

## 2017-05-02 ENCOUNTER — Telehealth: Payer: Self-pay | Admitting: Internal Medicine

## 2017-05-02 NOTE — Telephone Encounter (Signed)
Spoke with pt, he states he has a tumor on his pituitary gland. He states he would like to get this scanned as well but he needs contrast for this to be viewed. Does the sinus CT show this gland?  Dr. Floyde Parkins is his neurologist and he informed patient they would keep an eye on this so he was just wondering if he could get both done at the same time. MW please advise.   /March 12 5:45 pm  Assessment & Plan Note by Tanda Rockers, MD at 04/29/2017 3:14 PM   Author: Tanda Rockers, MD Author Type: Physician Filed: 04/29/2017 3:16 PM  Note Status: Written Cosign: Cosign Not Required Encounter Date: 04/28/2017  Problem: COPD GOLD I  Editor: Tanda Rockers, MD (Physician)       - Spirometry 03/30/2010 FEV1  2.86 (116%) ratio 66     - 06/13/2016  After extensive coaching HFA effectiveness =    75% from a baseline of 50%    - 01/31/2017  After extensive coaching HFA effectiveness =    75% at baseline  - FOB 02/01/2017  Copious retained secretions  - Flutter valve re-training 04/28/2017  - 04/28/2017  After extensive coaching inhaler device  effectiveness =    75%   Continues to have very poorly controlled mucociliary dysfunction out of proportion to airflow obst and suggestive of chronic / recurrent asp or sinus dz   rec add zmax as new prn abx and proceed with sinus CT then swallowing eval next.   I had an extended discussion with the patient and his companion Investment banker, corporate)  reviewing all relevant studies completed to date and  lasting 15 to 20 minutes of a 25 minute visit    Each maintenance medication was reviewed in detail including most importantly the difference between maintenance and prns and under what circumstances the prns are to be triggered using an action plan format that is not reflected in the computer generated alphabetically organized AVS.    Please see AVS for specific instructions unique to this visit that I personally wrote and verbalized to the the pt in detail and then  reviewed with pt  by my nurse highlighting any  changes in therapy recommended at today's visit to their plan of care.

## 2017-05-02 NOTE — Telephone Encounter (Signed)
Called and spoke with patient, he is aware of MW response and verbalized understanding.

## 2017-05-02 NOTE — Telephone Encounter (Signed)
The ct or mri of the pituitary will include  The sinuses but not the reverse so if /when he gets the scan of his pituitary he should let me know and I can look up the results on the computer we share with radiology and neurology - if they are not planning to look at the pituitary in the next 4-6 weeks then I would just go ahead with the limited sinus ct I ordered

## 2017-05-05 ENCOUNTER — Ambulatory Visit: Payer: Medicare Other | Admitting: Endocrinology

## 2017-05-05 ENCOUNTER — Encounter: Payer: Self-pay | Admitting: Endocrinology

## 2017-05-05 VITALS — BP 124/68 | HR 70 | Wt 159.2 lb

## 2017-05-05 DIAGNOSIS — E221 Hyperprolactinemia: Secondary | ICD-10-CM | POA: Diagnosis not present

## 2017-05-05 DIAGNOSIS — E034 Atrophy of thyroid (acquired): Secondary | ICD-10-CM | POA: Diagnosis not present

## 2017-05-05 DIAGNOSIS — D353 Benign neoplasm of craniopharyngeal duct: Secondary | ICD-10-CM

## 2017-05-05 DIAGNOSIS — D352 Benign neoplasm of pituitary gland: Secondary | ICD-10-CM

## 2017-05-05 LAB — BASIC METABOLIC PANEL
BUN: 13 mg/dL (ref 6–23)
CO2: 28 mEq/L (ref 19–32)
Calcium: 9.6 mg/dL (ref 8.4–10.5)
Chloride: 104 mEq/L (ref 96–112)
Creatinine, Ser: 0.83 mg/dL (ref 0.40–1.50)
GFR: 94.12 mL/min (ref >60.00)
Glucose, Bld: 100 mg/dL — ABNORMAL HIGH (ref 70–99)
Potassium: 3.9 mEq/L (ref 3.5–5.1)
Sodium: 139 mEq/L (ref 135–145)

## 2017-05-05 LAB — TSH: TSH: 2.63 u[IU]/mL (ref 0.35–4.50)

## 2017-05-05 LAB — T4, FREE: Free T4: 0.85 ng/dL (ref 0.60–1.60)

## 2017-05-05 NOTE — Progress Notes (Signed)
Subjective:    Patient ID: [name], male    DOB: 1932/09/29, 84 y.o.   MRN: [id number]  HPI the status of at least 3 ongoing medical problems is addressed today: pituitary macroadenoma (found incidentally on MRI of the C-spine in 2009; he sees Dr Carloyn Manner for this; last MRI in 2016 was unchanged from 2014):  no headache.  This is a stable problem. hyperprolactinemia: (dx'ed 2009; he was rx'ed parlodel, but dosage has been limited by dizziness) lightheadedness persists.  This is a stable problem. hypothyroidism:  He has ongoing dry skin.  He takes synthroid as rx'ed.  This is a stable problem. Past Medical History:  Diagnosis Date  . Abnormality of gait 06/07/2013  . Anemia   . Aneurysm ([place])    on assending aorta, currently watching it, Dr Cyndia Bent  . Anginal pain ([facility])    hx of   . Arthritis   . Asthma   . Atrial fibrillation ([place])   . Blood transfusion june 2011, 5 units given   july 2011 some units given  . CAD (coronary artery disease) 03/2006   3.0 x 20 mm TAXUS Perseus DES to the LAD; 01/2007  L main 30%, oLAD 50%, pLAD stent ok, CFX 80%, OM 60%, pRCA 60%, mRCA 70%, oPDA 90%; med rx   . Cancer ([place])    skin cancer   . Cataract    bilateral removal of cateracts  . Complication of anesthesia     no issues,but pt prefers spinal due to Pulmonary problems  . COPD (chronic obstructive pulmonary disease) ([place])    oxygen  on standby in home.  . Depression   . Diverticulosis   . Dizziness    occasional  . Emphysema   . Enlarged prostate    elevated psa recently  . Enlarged prostate with urinary retention   . Esophageal stenosis   . GERD (gastroesophageal reflux disease)   . Hiatal hernia   . Hypothyroidism   . Mass, scrotum    L scrotum - Sees Dr. Jeffie Pollock  . Neuropathy   . Pituitary macroadenoma ([place])   . Pneumonia    No recent.  Marland Kitchen Restless legs syndrome (RLS)   . Transfusion history    history 4 yrs ago.   Marland Kitchen UGIB (upper gastrointestinal bleed) 03/2013   EGD w/  large ulcer at GE junction  . UGIB (upper gastrointestinal bleed) 08/09/2009   At GE junction, rx w/ endoclips  . UGIB (upper gastrointestinal bleed) 09/2009   just above GE junction, rx  w/ 2 endoclips    Past Surgical History:  Procedure Laterality Date  . ABDOMINAL HERNIA REPAIR   2008  . CARDIAC CATHETERIZATION  01/2007   L main 30%, oLAD 50%,  pLAD stent ok, CFX 80%, OM 60%, pRCA 60%, mRCA 70%, oPDA 90%; med rx  . cataract extraction both eyes    . COLONOSCOPY    . CORONARY STENT PLACEMENT  03/2006   3.0 x 20 mm TAXUS Perseus DES to the LAD  . CYSTOSCOPY N/A 06/10/2015   Procedure: CYSTOSCOPY FULGRATION OF BLEEDING,  electovapor resection;  Surgeon: Irine Seal, MD;  Location: [facility];  Service: Urology;  Laterality: N/A;  . CYSTOSCOPY WITH INSERTION OF UROLIFT N/A 06/01/2015   Procedure: CYSTOSCOPY WITH INSERTION OF UROLIFT x4;  Surgeon: Irine Seal, MD;  Location: [facility];  Service: Urology;  Laterality: N/A;  . ESOPHAGOGASTRODUODENOSCOPY N/A 04/20/2013   Procedure: ESOPHAGOGASTRODUODENOSCOPY (EGD);  Surgeon: Inda Castle, MD;  [facility];  Service: Endoscopy;  Laterality: N/A;  . ESOPHAGOGASTRODUODENOSCOPY N/A 03/25/2016   Procedure: ESOPHAGOGASTRODUODENOSCOPY (EGD);  Surgeon: Irene Shipper, MD;  Location: [facility];  Service: Endoscopy;  Laterality: N/A;  . ESOPHAGOGASTRODUODENOSCOPY (EGD) WITH PROPOFOL N/A 10/13/2015   Procedure: ESOPHAGOGASTRODUODENOSCOPY (EGD) WITH PROPOFOL;  Surgeon: Jerene Bears, MD;  Location: [facility];  Service: Gastroenterology;  Laterality: N/A;  . FOOT SURGERY  1994 left, 2002 right foot   bilateral foot reconstruciton  . FOOT SURGERY     reconstruction of both feet- no retained hardware.  Marland Kitchen HIATAL HERNIA REPAIR  01-04-2008  . PROSTATE SURGERY     x 2  . SAVORY DILATION N/A 03/25/2016   Procedure: SAVORY DILATION;  Surgeon: Irene Shipper, MD;  [facility];  Service: Endoscopy;  Laterality: N/A;  . TOTAL HIP ARTHROPLASTY   07/21/2011   Procedure: TOTAL HIP ARTHROPLASTY ANTERIOR APPROACH;  Surgeon: Mauri Pole, MD;  Location: [facility];  Service: Orthopedics;  Laterality: Left;  . TOTAL HIP ARTHROPLASTY Right 09/07/2012   Procedure: RIGHT TOTAL HIP ARTHROPLASTY ANTERIOR APPROACH;  Surgeon: Mcarthur Rossetti, MD;  Location: [facility];  Service: Orthopedics;  Laterality: Right;  . TRANSURETHRAL RESECTION OF BLADDER NECK N/A 11/04/2015   Procedure: RESECTION OF BLADDER NECK;  Surgeon: Cleon Gustin, MD;  Location: [facility];  Service: Urology;  Laterality: N/A;  . TRANSURETHRAL RESECTION OF PROSTATE N/A 11/04/2015   Procedure: TRANSURETHRAL RESECTION OF THE PROSTATE (TURP); REMOVAL OF UROLIFT IMPLANTS X THREE;  Surgeon: Cleon Gustin, MD;  Location: [facility];  Service: Urology;  Laterality: N/A;  . UPPER GASTROINTESTINAL ENDOSCOPY  12/2013   Dr Hilarie Fredrickson, gastritis  . VIDEO BRONCHOSCOPY Bilateral 02/01/2017   Procedure: VIDEO BRONCHOSCOPY WITHOUT FLUORO;  Surgeon: Tanda Rockers, MD;  [facility];  Service: Cardiopulmonary;  Laterality: Bilateral;    Social History   Socioeconomic History  . Marital status: Widowed    Spouse name: Not on file  . Number of children: 2  . Years of education: hs  . Highest education level: Not on file  Social Needs  . Financial resource strain: Not on file  . Food insecurity - worry: Not on file  . Food insecurity - inability: Not on file  . Transportation needs - medical: Not on file  . Transportation needs - non-medical: Not on file  Occupational History  . Occupation: retired  Tobacco Use  . Smoking status: Never Smoker  . Smokeless tobacco: Never Used  Substance and Sexual Activity  . Alcohol use: No  . Drug use: No  . Sexual activity: No    Birth control/protection: None  Other Topics Concern  . Not on file  Social History Narrative   Patient drinks caffeine a few times a week.   Patient is right handed.    Current Outpatient Medications on File  Prior to Visit  Medication Sig Dispense Refill  . albuterol (PROVENTIL HFA;VENTOLIN HFA) 108 (90 Base) MCG/ACT inhaler Inhale 2 puffs into the lungs every 6 (six) hours as needed for wheezing or shortness of breath. 18 g 11  . azithromycin (ZITHROMAX) 250 MG tablet Take 2 on day one then 1 daily x 4 days 6 tablet 11  . bisacodyl (DULCOLAX) 10 MG suppository Place 10 mg rectally as needed for moderate constipation.    . bromocriptine (PARLODEL) 2.5 MG tablet Take 2.5 mg by mouth 2 (two) times daily.    . calcium carbonate (OSCAL) 1500 (600 Ca) MG TABS tablet Take 600 mg of elemental  calcium by mouth daily with breakfast.     . cholecalciferol (VITAMIN D) 1000 UNITS tablet Take 1,000 Units by mouth daily.     Marland Kitchen diltiazem (CARDIZEM CD) 120 MG 24 hr capsule TAKE (1) CAPSULE DAILY 90 capsule 0  . doxycycline (VIBRA-TABS) 100 MG tablet Take 1 tablet (100 mg total) by mouth 2 (two) times daily. 20 tablet 0  . ferrous sulfate 325 (65 FE) MG tablet Take 325 mg by mouth daily with breakfast.     . guaiFENesin (MUCINEX) 600 MG 12 hr tablet Take 600 mg by mouth daily.     Marland Kitchen levothyroxine (SYNTHROID, LEVOTHROID) 50 MCG tablet TAKE 1 TABLET IN MORNING 30 tablet 0  . magnesium oxide (MAG-OX) 400 (241.3 Mg) MG tablet Take 400 mg by mouth every other day.    . montelukast (SINGULAIR) 10 MG tablet Take 10 mg by mouth at bedtime as needed (for allergies).     . naphazoline-pheniramine (NAPHCON-A) 0.025-0.3 % ophthalmic solution Place 1 drop into both eyes 2 (two) times daily as needed for irritation or allergies.     . nitroGLYCERIN (NITROSTAT) 0.4 MG SL tablet Place 1 tablet (0.4 mg total) under the tongue every 5 (five) minutes as needed for chest pain. 25 tablet 2  . pantoprazole (PROTONIX) 40 MG tablet TAKE (1) TABLET TWICE A DAY BEFORE MEALS. 180 tablet 0  . polyethylene glycol (MIRALAX / GLYCOLAX) packet Take 17 g by mouth daily. (Patient taking differently: Take 17 g by mouth daily as needed for moderate  constipation. ) 30 each 3  . predniSONE (DELTASONE) 10 MG tablet Take  4 each am x 2 days,   2 each am x 2 days,  1 each am x 2 days and stop 14 tablet 0  . Respiratory Therapy Supplies (FLUTTER) DEVI 1 Device by Does not apply route as needed. 1 each 0  . sucralfate (CARAFATE) 1 g tablet TAKE (1) TABLET FOUR TIMES DAILY AS NEEDED FOR GI UPSET (Patient taking differently: TAKE (1) TABLET FOUR TIMES DAILY) 120 tablet 1  . SYMBICORT 160-4.5 MCG/ACT inhaler Inhale 2 puffs into the lungs 2 (two) times daily. 10.2 g 5  . vitamin B-12 (CYANOCOBALAMIN) 1000 MCG tablet Take 1,000 mcg by mouth daily.    . vitamin E (VITAMIN E) 400 UNIT capsule Take 400 Units by mouth daily.     No current facility-administered medications on file prior to visit.     Allergies  Allergen Reactions  . Betadine [Povidone Iodine] Other (See Comments)    Reaction:  Blisters   . Lortab [Hydrocodone-Acetaminophen] Nausea And Vomiting  . Penicillins Other (See Comments)    Reaction:  Lightheadedness  Has patient had a PCN reaction causing immediate rash, facial/tongue/throat swelling, SOB or lightheadedness with hypotension: Yes Has patient had a PCN reaction causing severe rash involving mucus membranes or skin necrosis: No Has patient had a PCN reaction that required hospitalization No Has patient had a PCN reaction occurring within the last 10 years: No If all of the above answers are "NO", then may proceed with Cephalosporin use.  Marland Kitchen Zetia [Ezetimibe] Other (See Comments)    Reaction:  Muscle weakness   . Zocor [Simvastatin] Nausea Only and Other (See Comments)    Reaction:  Muscle weakness     Family History  Problem Relation Age of Onset  . Heart disease Sister   . Heart failure Father   . Other Mother        phebitis related to Gabrielle's birth, he  was 100 weeks old when she died  . Colon cancer Neg Hx   . Esophageal cancer Neg Hx   . Rectal cancer Neg Hx   . Stomach cancer Neg Hx     BP 124/68 (BP Location:  Left Arm, Patient Position: Sitting, Cuff Size: Normal)   Pulse 70   Wt 159 lb 3.2 oz (72.2 kg)   SpO2 95%   BMI 24.93 kg/m    Review of Systems He has itching.  No LOC.  Denies polyuria.      Objective:   Physical Exam VITAL SIGNS:  See vs page GENERAL: no distress NECK: There is no palpable thyroid enlargement.  No thyroid nodule is palpable.  No palpable lymphadenopathy at the anterior neck.   Lab Results  Component Value Date   CREATININE 0.99 03/23/2017   BUN 13 03/23/2017   NA 141 03/23/2017   K 4.1 03/23/2017   CL 103 03/23/2017   CO2 25 03/23/2017   Prolactin=36    Assessment & Plan:  Pituitary adenoma: due for recheck Hyperprolactinemia: therapy limited by dizziness Hypothyroidism: due fore recheck.  Patient Instructions  blood tests are requested for you today.  We'll let you know about the results.  Let's recheck the MRI.  you will receive a phone call, about a day and time for an appointment.  Please come back for a follow-up appointment in 1 year.

## 2017-05-05 NOTE — Patient Instructions (Addendum)
blood tests are requested for you today.  We'll let you know about the results.  Let's recheck the MRI.  you will receive a phone call, about a day and time for an appointment.  Please come back for a follow-up appointment in 1 year.    

## 2017-05-06 LAB — PROLACTIN: Prolactin: 38.9 ng/mL — ABNORMAL HIGH (ref 2.0–18.0)

## 2017-05-09 ENCOUNTER — Ambulatory Visit (HOSPITAL_COMMUNITY)
Admission: RE | Admit: 2017-05-09 | Discharge: 2017-05-09 | Disposition: A | Discharge status: Home / Self Care | Payer: Medicare Other | Source: Ambulatory Visit | Attending: Internal Medicine | Admitting: Internal Medicine

## 2017-05-09 DIAGNOSIS — R053 Chronic cough: Secondary | ICD-10-CM

## 2017-05-09 DIAGNOSIS — R05 Cough: Secondary | ICD-10-CM | POA: Diagnosis not present

## 2017-05-09 DIAGNOSIS — J3489 Other specified disorders of nose and nasal sinuses: Secondary | ICD-10-CM | POA: Diagnosis not present

## 2017-05-10 NOTE — Progress Notes (Signed)
Spoke with pt and notified of results per Dr. Wert. Pt verbalized understanding and denied any questions. 

## 2017-05-15 ENCOUNTER — Other Ambulatory Visit: Payer: Self-pay | Admitting: Endocrinology

## 2017-05-15 ENCOUNTER — Other Ambulatory Visit: Payer: Self-pay | Admitting: Internal Medicine

## 2017-05-17 ENCOUNTER — Other Ambulatory Visit: Payer: Self-pay | Admitting: Internal Medicine

## 2017-05-17 ENCOUNTER — Other Ambulatory Visit: Payer: Self-pay | Admitting: *Deleted

## 2017-05-17 MED ORDER — SUCRALFATE 1 G PO TABS: ORAL_TABLET | ORAL | 0 refills | Status: DC

## 2017-05-30 ENCOUNTER — Other Ambulatory Visit: Payer: Self-pay | Admitting: Family Medicine

## 2017-05-30 ENCOUNTER — Encounter: Payer: Self-pay | Admitting: Internal Medicine

## 2017-05-30 ENCOUNTER — Encounter: Payer: Self-pay | Admitting: Nurse Practitioner

## 2017-05-30 ENCOUNTER — Ambulatory Visit: Payer: Medicare Other | Admitting: Nurse Practitioner

## 2017-05-30 VITALS — BP 130/76 | HR 76 | Ht 66.0 in | Wt 157.4 lb

## 2017-05-30 DIAGNOSIS — K219 Gastro-esophageal reflux disease without esophagitis: Secondary | ICD-10-CM | POA: Diagnosis not present

## 2017-05-30 NOTE — Patient Instructions (Signed)
If you are age 82 or older, your body mass index should be between 23-30. Your Body mass index is 25.41 kg/m. If this is out of the aforementioned range listed, please consider follow up with your Primary Care Provider.  If you are age 61 or younger, your body mass index should be between 19-25. Your Body mass index is 25.41 kg/m. If this is out of the aformentioned range listed, please consider follow up with your Primary Care Provider.   Follow-Up as needed.  Thank you for choosing [facility], Tye Savoy, NP

## 2017-05-30 NOTE — Progress Notes (Addendum)
IMPRESSION and PLAN:     84 yo male with chronic GERD / hx of esophageal strictures (dilated May 2018)  / Barretts esophagus (2015 biopsy). He continues to do well on carafate and PPI.  -continue anti-reflux measure. His HOB is up 6 " -we discussed effects of chocolate on LES -Meds already refilled -follow up in a year, or sooner if need be.   Addendum: Reviewed and agree with management. Pyrtle, Lajuan Lines, MD       HPI:    Chief Complaint:  GERD follow up  Patient is an 84 year old male known to Dr. Hilarie Fredrickson for history of chronic constipation,  GERD, Barrett's esophagus, esophageal strictures , and recurrent hiatal hernia following a repair. At time of last visit February 2018 patient was doing well on twice daily PPI, Carafate, small frequent meals, and antireflux measures. Last EGD May 2018 with dilation of a moderate esophageal stenosis and a 5 cm hiatal hernia.   Mr. Veselka is here for GERD follow up. He called for medication refill, advised that appt needed but meds have been refilled. If he takes carafate TID ac and HS and BID PPI then his GERD sx stay fairly well controlled. Patient asks about eating chocolate as it seems to cause breakthrough sx.  Avoids carbonated drinks. He has GI complaints.   Review of systems:     No chest pain, no SOB. No urinary sx.   Past Medical History:  Diagnosis Date  . Abnormality of gait 06/07/2013  . Anemia   . Aneurysm ([place])    on assending aorta, currently watching it, Dr Cyndia Bent  . Arthritis   . Asthma   . Atrial fibrillation ([place])   . Blood transfusion june 2011, 5 units given   july 2011 some units given  . CAD (coronary artery disease) 03/2006   3.0 x 20 mm TAXUS Perseus DES to the LAD; 01/2007  L main 30%, oLAD 50%, pLAD stent ok, CFX 80%, OM 60%, pRCA 60%, mRCA 70%, oPDA 90%; med rx   . Cancer ([place])    skin cancer   . Cataract    bilateral removal of cateracts  . Complication of anesthesia     no issues,but pt prefers  spinal due to Pulmonary problems  . COPD (chronic obstructive pulmonary disease) ([place])    oxygen  on standby in home.  . Depression   . Diverticulosis   . Emphysema   . Enlarged prostate    elevated psa recently  . Enlarged prostate with urinary retention   . Esophageal stenosis   . GERD (gastroesophageal reflux disease)   . Hiatal hernia   . Hypothyroidism   . Mass, scrotum    L scrotum - Sees Dr. Jeffie Pollock  . Neuropathy   . Pituitary macroadenoma ([place])   . Pneumonia    No recent.  Marland Kitchen Restless legs syndrome (RLS)   . Transfusion history    history 4 yrs ago.   Marland Kitchen UGIB (upper gastrointestinal bleed) 03/2013   EGD w/ large ulcer at GE junction  . UGIB (upper gastrointestinal bleed) 08/09/2009   At GE junction, rx w/ endoclips  . UGIB (upper gastrointestinal bleed) 09/2009   just above GE junction, rx  w/ 2 endoclips    Patient's surgical history, family medical history, social history, medications and allergies were all reviewed in Epic    Physical Exam:     Ht 5\' 6"  (1.676 m)   Wt 157 lb 6.4 oz (71.4 kg)  BMI 25.41 kg/m   GENERAL:  Well developed male in NAD PSYCH: :Pleasant, cooperative, normal affect EENT:  conjunctiva pink, mucous membranes moist, neck supple without masses CARDIAC:  RRR, no murmur heard, no peripheral edema PULM: Normal respiratory effort, lungs CTA bilaterally, no wheezing ABDOMEN:  Nondistended, soft, nontender. No obvious masses, no hepatomegaly,  normal bowel sounds SKIN:  turgor, no lesions seen Musculoskeletal:  Normal muscle tone, normal strength NEURO: Alert and oriented x 3, no focal neurologic deficits  I spent 25 minutes of face-to-face time with the patient. Greater than 50% of the time was spent counseling and coordinating care. Questions answered  Tye Savoy , NP 05/30/2017, 10:32 AM

## 2017-05-31 ENCOUNTER — Encounter: Payer: Self-pay | Admitting: Nurse Practitioner

## 2017-06-05 ENCOUNTER — Encounter: Payer: Self-pay | Admitting: *Deleted

## 2017-06-05 ENCOUNTER — Encounter: Payer: Self-pay | Admitting: Family Medicine

## 2017-06-08 ENCOUNTER — Encounter: Payer: Self-pay | Admitting: Surgery

## 2017-06-14 ENCOUNTER — Telehealth: Payer: Self-pay

## 2017-06-14 NOTE — Telephone Encounter (Signed)
Dr Cyndia Bent said it was ok to use your riding lawn mower and knee bending exercises are ok also. You just need   To not do any heavy lifting, no more than 35 lbs.  And the "endovascular grafting" is not a procedure that is done on your type of aneurysm, which is an ascending thoracic aortic aneurysm. If you have any more questions please call our office.

## 2017-06-30 ENCOUNTER — Other Ambulatory Visit: Payer: Self-pay | Admitting: Endocrinology

## 2017-07-07 ENCOUNTER — Telehealth: Payer: Self-pay | Admitting: Family Medicine

## 2017-07-07 ENCOUNTER — Encounter: Payer: Self-pay | Admitting: Family Medicine

## 2017-07-07 ENCOUNTER — Ambulatory Visit (INDEPENDENT_AMBULATORY_CARE_PROVIDER_SITE_OTHER): Payer: Medicare Other

## 2017-07-07 ENCOUNTER — Ambulatory Visit (INDEPENDENT_AMBULATORY_CARE_PROVIDER_SITE_OTHER): Payer: Medicare Other | Admitting: Family Medicine

## 2017-07-07 VITALS — BP 120/64 | HR 78 | Temp 97.8°F | Ht 66.0 in | Wt 156.1 lb

## 2017-07-07 DIAGNOSIS — J40 Bronchitis, not specified as acute or chronic: Secondary | ICD-10-CM

## 2017-07-07 DIAGNOSIS — R059 Cough, unspecified: Secondary | ICD-10-CM

## 2017-07-07 DIAGNOSIS — J329 Chronic sinusitis, unspecified: Secondary | ICD-10-CM

## 2017-07-07 DIAGNOSIS — R05 Cough: Secondary | ICD-10-CM

## 2017-07-07 MED ORDER — BETAMETHASONE SOD PHOS & ACET 6 (3-3) MG/ML IJ SUSP
3.0000 mg | Freq: Once | INTRAMUSCULAR | Status: AC
  Administered 2017-07-07: 3 mg via INTRAMUSCULAR

## 2017-07-07 MED ORDER — HYDROCODONE-HOMATROPINE 5-1.5 MG/5ML PO SYRP: 5.0000 mL | ORAL_SOLUTION | Freq: Four times a day (QID) | ORAL | 0 refills | Status: DC | PRN

## 2017-07-07 MED ORDER — LEVOFLOXACIN 250 MG PO TABS: 250.0000 mg | ORAL_TABLET | Freq: Every day | ORAL | 0 refills | Status: DC

## 2017-07-07 NOTE — Telephone Encounter (Signed)
Pt aware - med at pharm  

## 2017-07-07 NOTE — Progress Notes (Signed)
Chief Complaint  Patient presents with  . Cough    pt here today c/o cough and congestion especially at night    HPI  Patient presents today for Patient presents with upper respiratory congestion.  He has been lightheaded . There is moderate sore throat. Patient reports coughing frequently as well.  Thick, yellow sputum noted. There is low-grade fever of 99.. The patient reports being short of breath with moderate exertion. Onset was 1 week ago. Gradually worsening. Tried OTCs without improvement.  PMH: Smoking status noted ROS: Per HPI  Objective: BP 120/64   Pulse 78   Temp 97.8 F (36.6 C) (Oral)   Ht 5\' 6"  (1.676 m)   Wt 156 lb 2 oz (70.8 kg)   BMI 25.20 kg/m  Gen: NAD, alert, cooperative with exam HEENT: NCAT, Nasal passages swollen, red TMS RED CV: RRR, good S1/S2, no murmur Resp: Bronchitis changes with scattered wheezes, non-labored Ext: No edema, warm Neuro: Alert and oriented, No gross deficits Chest x-ray: No acute disease noted Assessment and plan:  1. Cough     Meds ordered this encounter  Medications  . levofloxacin (LEVAQUIN) 250 MG tablet    Sig: Take 1 tablet (250 mg total) by mouth daily.    Dispense:  10 tablet    Refill:  0  . betamethasone acetate-betamethasone sodium phosphate (CELESTONE) injection 3 mg  . HYDROcodone-homatropine (HYCODAN) 5-1.5 MG/5ML syrup    Sig: Take 5 mLs by mouth every 6 (six) hours as needed for cough.    Dispense:  120 mL    Refill:  0    Orders Placed This Encounter  Procedures  . DG Chest 2 View    Standing Status:   Future    Number of Occurrences:   1    Standing Expiration Date:   09/06/2018    Order Specific Question:   Reason for Exam (SYMPTOM  OR DIAGNOSIS REQUIRED)    Answer:   cough    Order Specific Question:   Preferred imaging location?    Answer:   Internal    Follow up as needed.  Claretta Fraise, MD

## 2017-07-07 NOTE — Telephone Encounter (Signed)
Appt made to be seen

## 2017-07-07 NOTE — Telephone Encounter (Signed)
What symptoms do you have?hard deep Cough, congestion, clear thick phlegm  How long have you been sick? About a week  Have you been seen for this problem? no  If your provider decides to give you a prescription, which pharmacy would you like for it to be sent to?[facility], should he be seen?   Patient informed that this information will be sent to the clinical staff for review and that they should receive a follow up call.

## 2017-07-11 ENCOUNTER — Other Ambulatory Visit: Payer: Self-pay | Admitting: Family Medicine

## 2017-07-19 ENCOUNTER — Ambulatory Visit (INDEPENDENT_AMBULATORY_CARE_PROVIDER_SITE_OTHER): Payer: Medicare Other | Admitting: Family Medicine

## 2017-07-19 ENCOUNTER — Encounter: Payer: Self-pay | Admitting: Family Medicine

## 2017-07-19 VITALS — BP 141/71 | HR 67 | Temp 96.9°F | Ht 66.0 in | Wt 154.0 lb

## 2017-07-19 DIAGNOSIS — I739 Peripheral vascular disease, unspecified: Secondary | ICD-10-CM | POA: Diagnosis not present

## 2017-07-19 DIAGNOSIS — E559 Vitamin D deficiency, unspecified: Secondary | ICD-10-CM | POA: Diagnosis not present

## 2017-07-19 DIAGNOSIS — K219 Gastro-esophageal reflux disease without esophagitis: Secondary | ICD-10-CM | POA: Diagnosis not present

## 2017-07-19 DIAGNOSIS — I712 Thoracic aortic aneurysm, without rupture: Secondary | ICD-10-CM | POA: Diagnosis not present

## 2017-07-19 DIAGNOSIS — I7121 Aneurysm of the ascending aorta, without rupture: Secondary | ICD-10-CM

## 2017-07-19 DIAGNOSIS — E221 Hyperprolactinemia: Secondary | ICD-10-CM

## 2017-07-19 DIAGNOSIS — E034 Atrophy of thyroid (acquired): Secondary | ICD-10-CM | POA: Diagnosis not present

## 2017-07-19 DIAGNOSIS — I700 Atherosclerosis of aorta: Secondary | ICD-10-CM | POA: Diagnosis not present

## 2017-07-19 DIAGNOSIS — J42 Unspecified chronic bronchitis: Secondary | ICD-10-CM | POA: Diagnosis not present

## 2017-07-19 DIAGNOSIS — I4891 Unspecified atrial fibrillation: Secondary | ICD-10-CM | POA: Diagnosis not present

## 2017-07-19 DIAGNOSIS — I729 Aneurysm of unspecified site: Secondary | ICD-10-CM | POA: Diagnosis not present

## 2017-07-19 NOTE — Progress Notes (Signed)
Subjective:    Patient ID: [name], male    DOB: 04-10-1932, 84 y.o.   MRN: [id number]  HPI Pt here for follow up and management of chronic medical problems which includes hypothyroid. He is taking medication regularly.  The patient is doing well overall.  He does complain with some ongoing congestion which she has had for a good time because of his chronic obstructive pulmonary disease.  He also complains of a cyst at his right wrist.  He is currently being followed by several specialists including the urologist and the vascular surgeon and the cardiologist.  He also has a lot of arthritis especially in his feet.  He has documented atrial fibrillation and coronary artery disease.  He also has a pituitary macroadenoma.  The patient sees Dr. Shyrl Numbers, Dr. Mohammed Kindle, and Dr. Percival Spanish and Dr. Alyson Ingles for his prostate.  The patient today denies any chest pain or any worsening shortness of breath than usual.  In fact he says he feels the best today that he is felt in a good while.  He just completed a round of Levaquin.  He does have Z-Paks that he can take for recurring bouts of bronchitis and has a return appointment for some additional breathing studies with the pulmonologist in June.  He is doing well with his stomach and is taking Carafate and Protonix regularly and not having any complaints with this currently he has not seen any blood in the stool or had any black tarry bowel movements and he did deny any problems with heartburn swallowing or change in bowel habits.  He does have ongoing incontinence and is currently still having problems with voiding because of his large prostate and is stream is slow.   Patient Active Problem List   Diagnosis Date Noted  . Chronic cough 04/28/2017  . Chronic bronchitis ([place]) 03/23/2017  . Aortic atherosclerosis ([place]) 11/21/2016  . Ascending aortic aneurysm ([place]) 08/10/2016  . Atherosclerosis of native coronary artery of native heart without angina pectoris  08/10/2016  . Gastritis 05/04/2016  . Hiatal hernia 05/04/2016  . Abnormal chest x-ray 03/28/2016  . Esophageal stricture   . Non-intractable vomiting   . Abdominal pain, epigastric   . Dysphagia   . Atrial fibrillation with rapid ventricular response ([place])   . Elevated troponin   . Atrial fibrillation with RVR ([place]) 06/12/2015  . Pneumonia 06/12/2015  . Leukocytosis 06/12/2015  . Clot retention of urine 06/11/2015  . BPH (benign prostatic hypertrophy) with urinary obstruction 06/11/2015  . Postoperative anemia due to acute blood loss 06/11/2015  . Hematuria 06/10/2015  . Neck pain, bilateral 12/31/2013  . Neuropathy 12/31/2013  . Vitamin B 12 deficiency 12/31/2013  . Abnormality of gait 06/07/2013  . Esophageal ulcer with bleeding 04/20/2013  . S/P left and ritght THA, AA 07/21/2011  . Special screening for malignant neoplasms, colon 01/03/2011  . Personal history of colonic polyps 01/03/2011  . COPD GOLD I 08/24/2010  . GI BLEED 11/17/2009  . ESOPHAGEAL REFLUX 08/25/2009  . BLOOD IN STOOL-MELENA 08/25/2009  . COLONIC POLYPS 07/17/2008  . ANEMIA 07/17/2008  . HYPOTENSION 07/17/2008  . Gastroesophageal reflux disease with esophagitis 07/17/2008  . DIVERTICULOSIS, MILD 07/17/2008  . DEGENERATIVE JOINT DISEASE 07/17/2008  . Osteoporosis 07/17/2008  . BPH (benign prostatic hyperplasia) 07/17/2008  . Hyperprolactinemia ([place]) 10/24/2007  . PITUITARY ADENOMA 06/25/2007  . Hypothyroidism 06/25/2007  . Hyperlipidemia LDL goal <70 06/22/2007  . Coronary atherosclerosis 06/22/2007   Outpatient Encounter Medications as of 07/19/2017  Medication Sig  . albuterol (PROVENTIL HFA;VENTOLIN HFA) 108 (90 Base) MCG/ACT inhaler Inhale 2 puffs into the lungs every 6 (six) hours as needed for wheezing or shortness of breath.  . bisacodyl (DULCOLAX) 10 MG suppository Place 10 mg rectally as needed for moderate constipation.  . bromocriptine (PARLODEL) 2.5 MG tablet Take 2.5 mg by mouth 2 (two)  times daily.  . calcium carbonate (OSCAL) 1500 (600 Ca) MG TABS tablet Take 600 mg of elemental calcium by mouth daily with breakfast.   . cholecalciferol (VITAMIN D) 1000 UNITS tablet Take 1,000 Units by mouth daily.   Marland Kitchen diltiazem (CARDIZEM CD) 120 MG 24 hr capsule TAKE (1) CAPSULE DAILY  . ferrous sulfate 325 (65 FE) MG tablet Take 325 mg by mouth daily with breakfast.   . guaiFENesin (MUCINEX) 600 MG 12 hr tablet Take 600 mg by mouth daily.   Marland Kitchen HYDROcodone-homatropine (HYCODAN) 5-1.5 MG/5ML syrup Take 5 mLs by mouth every 6 (six) hours as needed for cough.  . levothyroxine (SYNTHROID, LEVOTHROID) 50 MCG tablet TAKE 1 TABLET IN MORNING  . magnesium oxide (MAG-OX) 400 (241.3 Mg) MG tablet Take 400 mg by mouth every other day.  . montelukast (SINGULAIR) 10 MG tablet Take 10 mg by mouth at bedtime as needed (for allergies).   . montelukast (SINGULAIR) 10 MG tablet Take 1 tablet (10 mg total) by mouth daily.  . naphazoline-pheniramine (NAPHCON-A) 0.025-0.3 % ophthalmic solution Place 1 drop into both eyes 2 (two) times daily as needed for irritation or allergies.   . nitroGLYCERIN (NITROSTAT) 0.4 MG SL tablet Place 1 tablet (0.4 mg total) under the tongue every 5 (five) minutes as needed for chest pain.  . pantoprazole (PROTONIX) 40 MG tablet TAKE (1) TABLET TWICE A DAY BEFORE MEALS.  Marland Kitchen polyethylene glycol (MIRALAX / GLYCOLAX) packet Take 17 g by mouth daily. (Patient taking differently: Take 17 g by mouth daily as needed for moderate constipation. )  . predniSONE (DELTASONE) 10 MG tablet Take  4 each am x 2 days,   2 each am x 2 days,  1 each am x 2 days and stop  . Respiratory Therapy Supplies (FLUTTER) DEVI 1 Device by Does not apply route as needed.  . sucralfate (CARAFATE) 1 g tablet TAKE (1) TABLET FOUR TIMES DAILY AS NEEDED FOR GI UPSET (Patient taking differently: TAKE (1) TABLET FOUR TIMES DAILY)  . SYMBICORT 160-4.5 MCG/ACT inhaler Inhale 2 puffs into the lungs 2 (two) times daily.  .  vitamin B-12 (CYANOCOBALAMIN) 1000 MCG tablet Take 1,000 mcg by mouth daily.  . vitamin E (VITAMIN E) 400 UNIT capsule Take 400 Units by mouth daily.  . [DISCONTINUED] azithromycin (ZITHROMAX) 250 MG tablet Take 2 on day one then 1 daily x 4 days  . [DISCONTINUED] levofloxacin (LEVAQUIN) 250 MG tablet Take 1 tablet (250 mg total) by mouth daily.  . [DISCONTINUED] diltiazem (CARDIZEM CD) 120 MG 24 hr capsule TAKE (1) CAPSULE DAILY   No facility-administered encounter medications on file as of 07/19/2017.       Review of Systems  Constitutional: Negative.   HENT: Positive for congestion (mostly clear ).   Eyes: Negative.   Respiratory: Negative.   Cardiovascular: Negative.   Gastrointestinal: Negative.   Endocrine: Negative.   Genitourinary: Negative.   Musculoskeletal: Negative.        Cyst - left wrist   Skin: Negative.   Allergic/Immunologic: Negative.   Neurological: Negative.   Hematological: Negative.   Psychiatric/Behavioral: Negative.  Objective:   Physical Exam  Constitutional: He is oriented to person, place, and time. He appears well-developed and well-nourished.  The patient is pleasant and alert and looks great today and is in a good mood.  HENT:  Head: Normocephalic and atraumatic.  Right Ear: External ear normal.  Left Ear: External ear normal.  Mouth/Throat: Oropharynx is clear and moist. No oropharyngeal exudate.  He does have nasal turbinate congestion  Eyes: Pupils are equal, round, and reactive to light. Conjunctivae and EOM are normal. Right eye exhibits no discharge. Left eye exhibits no discharge.  Neck: Normal range of motion. Neck supple. No thyromegaly present.  No bruits thyromegaly or anterior cervical adenopathy  Cardiovascular: Normal rate, regular rhythm and normal heart sounds.  No murmur heard. Heart is regular today at 60/min distal pulses were difficult to palpate.  Pulmonary/Chest: Effort normal and breath sounds normal. He has no  wheezes. He has no rales. He exhibits no tenderness.  Breath sounds were very clear for this patient today.  There was minimal congestion even with coughing.  There is no wheezing or rales.  There is no axillary adenopathy.  Abdominal: Soft. Bowel sounds are normal. He exhibits no mass. There is no tenderness.  No abdominal tenderness masses organ enlargement or bruits  Genitourinary:  Genitourinary Comments: The patient sees the urologist regularly.  Musculoskeletal: Normal range of motion. He exhibits deformity. He exhibits no edema.  Flat arches bilaterally secondary to previous surgeries.  Lymphadenopathy:    He has no cervical adenopathy.  Neurological: He is alert and oriented to person, place, and time. He has normal reflexes. No cranial nerve deficit. He exhibits normal muscle tone.  Skin: Skin is warm and dry. No rash noted.  Psychiatric: He has a normal mood and affect. His behavior is normal. Judgment and thought content normal.  The patient's mood affect and behavior were great today.  Nursing note and vitals reviewed.  BP (!) 141/71 (BP Location: Left Arm)   Pulse 67   Temp (!) 96.9 F (36.1 C) (Oral)   Ht '5\' 6"'$  (1.676 m)   Wt 154 lb (69.9 kg)   BMI 24.86 kg/m         Assessment & Plan:  1. Hypothyroidism due to acquired atrophy of thyroid -Continue current treatment pending results of lab work - CBC with Differential/Platelet - Thyroid Panel With TSH  2. Gastroesophageal reflux disease, esophagitis presence not specified -Continue with Protonix and Carafate and head of the bed elevation - CBC with Differential/Platelet - Hepatic function panel  3. Vitamin D deficiency -Continue with vitamin D replacement pending results of lab work - CBC with Differential/Platelet - VITAMIN D 25 Hydroxy (Vit-D Deficiency, Fractures)  4. Atrial fibrillation with RVR ([place]) -Heart had a regular rate and rhythm today at 60/min and he also will continue to follow-up with  cardiology - CBC with Differential/Platelet  5. Thoracic ascending aortic aneurysm ([place]) -Continue to follow-up with cardiovascular surgeon for monitoring of this aneurysm - BMP8+EGFR - CBC with Differential/Platelet - Lipid panel  6. Aortic atherosclerosis ([place]) -Continue aggressive therapeutic lifestyle changes and the best cholesterol control possible through diet exercise and medication - BMP8+EGFR - CBC with Differential/Platelet - Lipid panel  7. Peripheral vascular insufficiency ([facility]) -The patient continues to have diminished pulses in both feet and he will continue to try to walk and exercise as much as possible  8. Aneurysmal dilatation ([place]) -Follow-up with vascular surgeon as planned  9. Chronic bronchitis, unspecified chronic bronchitis type ([place]) -  Follow-up with pulmonologist as planned  10. Hyperprolactinemia ([place]) -Continue to follow-up with neurosurgery as planned  No orders of the defined types were placed in this encounter.  Patient Instructions                       Medicare Annual Wellness Visit  [facility] and the medical providers at [facility] strive to bring you the best medical care.  In doing so we not only want to address your current medical conditions and concerns but also to detect new conditions early and prevent illness, disease and health-related problems.    Medicare offers a yearly Wellness Visit which allows our clinical staff to assess your need for preventative services including immunizations, lifestyle education, counseling to decrease risk of preventable diseases and screening for fall risk and other medical concerns.    This visit is provided free of charge (no copay) for all Medicare recipients. The clinical pharmacists at [facility] have begun to conduct these Wellness Visits which will also include a thorough review of all your medications.    As you primary medical provider recommend  that you make an appointment for your Annual Wellness Visit if you have not done so already this year.  You may set up this appointment before you leave today or you may call back ([phone]) and schedule an appointment.  Please make sure when you call that you mention that you are scheduling your Annual Wellness Visit with the clinical pharmacist so that the appointment may be made for the proper length of time.     Continue current medications. Continue good therapeutic lifestyle changes which include good diet and exercise. Fall precautions discussed with patient. If an FOBT was given today- please return it to our front desk. If you are over 79 years old - you may need Prevnar 19 or the adult Pneumonia vaccine.  **Flu shots are available--- please call and schedule a FLU-CLINIC appointment**  After your visit with [place] today you will receive a survey in the mail or online from [employer] regarding your care with [place]. Please take a moment to fill this out. Your feedback is very important to [place] as you can help [place] better understand your patient needs as well as improve your experience and satisfaction. WE CARE ABOUT YOU!!!   Continue to follow-up with vascular surgeon and cardiologist pulmonologist and urologist Drink plenty of fluids and stay well-hydrated Use nasal saline during the day to help protect your nasal passages from pollen irritation and continue with Flonase Continue with Mucinex and inhalers Stay active physically and do not put yourself at risk for falling  Arrie Senate MD

## 2017-07-19 NOTE — Patient Instructions (Addendum)
Medicare Annual Wellness Visit  [facility] and the medical providers at [facility] strive to bring you the best medical care.  In doing so we not only want to address your current medical conditions and concerns but also to detect new conditions early and prevent illness, disease and health-related problems.    Medicare offers a yearly Wellness Visit which allows our clinical staff to assess your need for preventative services including immunizations, lifestyle education, counseling to decrease risk of preventable diseases and screening for fall risk and other medical concerns.    This visit is provided free of charge (no copay) for all Medicare recipients. The clinical pharmacists at [facility] have begun to conduct these Wellness Visits which will also include a thorough review of all your medications.    As you primary medical provider recommend that you make an appointment for your Annual Wellness Visit if you have not done so already this year.  You may set up this appointment before you leave today or you may call back ([phone]) and schedule an appointment.  Please make sure when you call that you mention that you are scheduling your Annual Wellness Visit with the clinical pharmacist so that the appointment may be made for the proper length of time.     Continue current medications. Continue good therapeutic lifestyle changes which include good diet and exercise. Fall precautions discussed with patient. If an FOBT was given today- please return it to our front desk. If you are over 25 years old - you may need Prevnar 64 or the adult Pneumonia vaccine.  **Flu shots are available--- please call and schedule a FLU-CLINIC appointment**  After your visit with [place] today you will receive a survey in the mail or online from [employer] regarding your care with [place]. Please take a moment to fill this out. Your feedback is very  important to [place] as you can help [place] better understand your patient needs as well as improve your experience and satisfaction. WE CARE ABOUT YOU!!!   Continue to follow-up with vascular surgeon and cardiologist pulmonologist and urologist Drink plenty of fluids and stay well-hydrated Use nasal saline during the day to help protect your nasal passages from pollen irritation and continue with Flonase Continue with Mucinex and inhalers Stay active physically and do not put yourself at risk for falling

## 2017-07-20 LAB — LIPID PANEL
Chol/HDL Ratio: 3.4 ratio (ref 0.0–5.0)
Cholesterol, Total: 173 mg/dL (ref 100–199)
HDL: 51 mg/dL (ref >39)
LDL Calculated: 112 mg/dL — ABNORMAL HIGH (ref 0–99)
Triglycerides: 49 mg/dL (ref 0–149)
VLDL Cholesterol Cal: 10 mg/dL (ref 5–40)

## 2017-07-20 LAB — CBC WITH DIFFERENTIAL/PLATELET
Basophils Absolute: 0.2 10*3/uL (ref 0.0–0.2)
Basos: 2 %
EOS (ABSOLUTE): 0.5 10*3/uL — ABNORMAL HIGH (ref 0.0–0.4)
Eos: 5 %
Hematocrit: 36.6 % — ABNORMAL LOW (ref 37.5–51.0)
Hemoglobin: 12.2 g/dL — ABNORMAL LOW (ref 13.0–17.7)
Lymphocytes Absolute: 1.4 10*3/uL (ref 0.7–3.1)
Lymphs: 15 %
MCH: 31.7 pg (ref 26.6–33.0)
MCHC: 33.3 g/dL (ref 31.5–35.7)
MCV: 95 fL (ref 79–97)
Monocytes Absolute: 1.3 10*3/uL — ABNORMAL HIGH (ref 0.1–0.9)
Monocytes: 14 %
Neutrophils Absolute: 5 10*3/uL (ref 1.4–7.0)
Neutrophils: 55 %
Platelets: 280 10*3/uL (ref 150–450)
RBC: 3.85 x10E6/uL — ABNORMAL LOW (ref 4.14–5.80)
RDW: 15.1 % (ref 12.3–15.4)
WBC: 9 10*3/uL (ref 3.4–10.8)

## 2017-07-20 LAB — HEPATIC FUNCTION PANEL
ALT: 14 IU/L (ref 0–44)
AST: 21 IU/L (ref 0–40)
Albumin: 3.7 g/dL (ref 3.5–4.7)
Alkaline Phosphatase: 86 IU/L (ref 39–117)
Bilirubin Total: 0.4 mg/dL (ref 0.0–1.2)
Bilirubin, Direct: 0.11 mg/dL (ref 0.00–0.40)
Total Protein: 6.3 g/dL (ref 6.0–8.5)

## 2017-07-20 LAB — BMP8+EGFR
BUN/Creatinine Ratio: 12 (ref 10–24)
BUN: 13 mg/dL (ref 8–27)
CO2: 23 mmol/L (ref 20–29)
Calcium: 8.7 mg/dL (ref 8.6–10.2)
Chloride: 104 mmol/L (ref 96–106)
Creatinine, Ser: 1.08 mg/dL (ref 0.76–1.27)
GFR calc Af Amer: 74 mL/min/{1.73_m2} (ref >59)
GFR calc non Af Amer: 64 mL/min/{1.73_m2} (ref >59)
Glucose: 89 mg/dL (ref 65–99)
Potassium: 4.2 mmol/L (ref 3.5–5.2)
Sodium: 140 mmol/L (ref 134–144)

## 2017-07-20 LAB — VITAMIN D 25 HYDROXY (VIT D DEFICIENCY, FRACTURES): Vit D, 25-Hydroxy: 41.3 ng/mL (ref 30.0–100.0)

## 2017-07-20 LAB — THYROID PANEL WITH TSH
Free Thyroxine Index: 1.6 (ref 1.2–4.9)
T3 Uptake Ratio: 27 % (ref 24–39)
T4, Total: 6.1 ug/dL (ref 4.5–12.0)
TSH: 3.56 u[IU]/mL (ref 0.450–4.500)

## 2017-07-20 LAB — IMMATURE CELLS
MYELOCYTES: 2 % — ABNORMAL HIGH (ref 0–0)
Metamyelocytes: 7 % — ABNORMAL HIGH (ref 0–0)

## 2017-07-31 ENCOUNTER — Other Ambulatory Visit (INDEPENDENT_AMBULATORY_CARE_PROVIDER_SITE_OTHER): Payer: Medicare Other

## 2017-07-31 ENCOUNTER — Ambulatory Visit: Payer: Medicare Other | Admitting: Internal Medicine

## 2017-07-31 ENCOUNTER — Encounter: Payer: Self-pay | Admitting: Internal Medicine

## 2017-07-31 ENCOUNTER — Ambulatory Visit (INDEPENDENT_AMBULATORY_CARE_PROVIDER_SITE_OTHER): Payer: Medicare Other | Admitting: Internal Medicine

## 2017-07-31 VITALS — BP 102/66 | HR 72 | Ht 67.0 in | Wt 155.0 lb

## 2017-07-31 DIAGNOSIS — R05 Cough: Secondary | ICD-10-CM

## 2017-07-31 DIAGNOSIS — J449 Chronic obstructive pulmonary disease, unspecified: Secondary | ICD-10-CM

## 2017-07-31 DIAGNOSIS — R053 Chronic cough: Secondary | ICD-10-CM

## 2017-07-31 DIAGNOSIS — R9389 Abnormal findings on diagnostic imaging of other specified body structures: Secondary | ICD-10-CM | POA: Diagnosis not present

## 2017-07-31 LAB — PULMONARY FUNCTION TEST
FEF 25-75 Post: 1.65 L/sec
FEF 25-75 Pre: 1.12 L/sec
FEF2575-%Change-Post: 47 %
FEF2575-%Pred-Post: 105 %
FEF2575-%Pred-Pre: 71 %
FEV1-%Change-Post: 12 %
FEV1-%Pred-Post: 102 %
FEV1-%Pred-Pre: 91 %
FEV1-Post: 2.44 L
FEV1-Pre: 2.16 L
FEV1FVC-%Change-Post: 1 %
FEV1FVC-%Pred-Pre: 88 %
FEV6-%Change-Post: 10 %
FEV6-%Pred-Post: 119 %
FEV6-%Pred-Pre: 108 %
FEV6-Post: 3.77 L
FEV6-Pre: 3.4 L
FEV6FVC-%Change-Post: 0 %
FEV6FVC-%Pred-Post: 107 %
FEV6FVC-%Pred-Pre: 107 %
FVC-%Change-Post: 10 %
FVC-%Pred-Post: 111 %
FVC-%Pred-Pre: 101 %
FVC-Post: 3.81 L
FVC-Pre: 3.44 L
Post FEV1/FVC ratio: 64 %
Post FEV6/FVC ratio: 99 %
Pre FEV1/FVC ratio: 63 %
Pre FEV6/FVC Ratio: 99 %
RV % pred: 112 %
RV: 2.87 L
TLC % pred: 93 %
TLC: 6.06 L

## 2017-07-31 LAB — CBC WITH DIFFERENTIAL/PLATELET
Basophils Absolute: 0.1 10*3/uL (ref 0.0–0.1)
Basophils Relative: 1.1 % (ref 0.0–3.0)
Eosinophils Absolute: 0.2 10*3/uL (ref 0.0–0.7)
Eosinophils Relative: 2.5 % (ref 0.0–5.0)
HCT: 36.1 % — ABNORMAL LOW (ref 39.0–52.0)
Hemoglobin: 12.4 g/dL — ABNORMAL LOW (ref 13.0–17.0)
Lymphocytes Relative: 16.9 % (ref 12.0–46.0)
Lymphs Abs: 1.3 10*3/uL (ref 0.7–4.0)
MCHC: 34.4 g/dL (ref 30.0–36.0)
MCV: 94.9 fl (ref 78.0–100.0)
Monocytes Absolute: 1 10*3/uL (ref 0.1–1.0)
Monocytes Relative: 13.1 % — ABNORMAL HIGH (ref 3.0–12.0)
Neutro Abs: 5.2 10*3/uL (ref 1.4–7.7)
Neutrophils Relative %: 66.4 % (ref 43.0–77.0)
Platelets: 227 10*3/uL (ref 150.0–400.0)
RBC: 3.8 Mil/uL — ABNORMAL LOW (ref 4.22–5.81)
RDW: 15.6 % — ABNORMAL HIGH (ref 11.5–15.5)
WBC: 7.8 10*3/uL (ref 4.0–10.5)

## 2017-07-31 MED ORDER — LEVOFLOXACIN 500 MG PO TABS: 500.0000 mg | ORAL_TABLET | Freq: Every day | ORAL | 11 refills | Status: DC

## 2017-07-31 NOTE — Progress Notes (Signed)
Subjective:   Patient ID: [name], male    DOB: [birth year]   MRN: [id number]    Brief patient profile:  84   yowm quit smoking completely around 1980 (relatively light) with cough and short of breath eval by  [name]  [name] dx of copd/ cb  pft's showed fev1 116% 02/2010 though ratio 66 c/w GOLD I criteria    History of Present Illness  07/05/2010 ov cc recurrent pna's since June 2011 cc not back to baseline in terms of activities he enjoyed in May, for example  Could do some yardwork  and rarely  Needed saba and no need for any maint rx,   but on  spriva for sob and still frequent tightness generalized front more than back,  Equal both sides  low grade fever and mucus gets thick and yellow> admit to [facility] April 24-27 by [facility] dx of ? Pna.   No sob at rest.   Continue protonix 40 mg  Take 30-60 min before first meal of the day and Pepcid 20 mg at bedtime GERD (REFLUX) diet Try symbicort 160 Take 2 puffs bid and work on hfa technique    08/24/2010 ov/[name]/[name] cc breathing and cough better despite L rib fx 07/22/10. Main concern is how to prevent future exacerbations.  No limiting sob but not as active since quit smoking. Doing so much better overall rec  Ok to stop spiriva unless it reduces your desired activity performance (it's like high octane fuel for your lungs so your can be all you can be) Continue symbicort 160 Take 2 puffs first thing in am and Allen another 2 puffs about 12 hours later.   In event of recurrent signs of infection:  Change in sputum, fever, cough, short of breath or sore throat go ahead and take a course of doxycycline - if this doesn't help we need to see you in [name] ([name]/ [name]) F/u prn      01/31/2017  f/u ov/[name] re:  Copd 1/ chronic bronchitis / ? Prev aspiraton with abnormal cT chest  ? obst RLL  Chief Complaint  Patient presents with  . Follow-up    Pt states he has had "a touch of PNA". He has been coughing since the beginning on Nov  2018.  He has taken doxy and levaquin.  He is coughing up greyish white sputum.  He has an albuterol inhaler that he rarely uses.   swallowing better now but still very congested cough esp since early nov 2018  Not using flutter valve as rec  hfa has improved but not yet adequate Still some hb/ sore throat symptoms despite reporting ppi bid  Also still some noct cough sev hours p lie down despite using 6 inch blocks rec FOB 02/01/17> tenacious secretions> nl flora/ no tb, no fungus on culture    04/28/2017  f/u ov/[name] re: f/u COPD / cb abn ct again Chief Complaint  Patient presents with  . Acute Visit    Increased cough x 10 days- prod with white to grey sputum. He started on Doxy per [name]- has one dose remaining. He states his breathing is unchanged since his last visit. He rarely uses his albuterol inhaler.   Had CT Chest done 04/26/17.    did better p first few days p fob until the end Dec nasty mucus, more sob > doxy Allen added zpak Allen better again Allen worse again last week if Feb 2019 restarted doxy and now  on day #  9/10 on  ct chest 04/26/17 obst again on w/u for TAA Doe = MMRC1 = can walk nl pace, flat grade, can't hurry or go uphills or steps s sob   Sleeps ok but quit a bit of am congestion /mucus still grey   rec Please see patient coordinator before you leave today  to schedule sinus CT> neg  For nasty mucus >  zpak  Prednisone 10 mg take  4 each am x 2 days,   2 each am x 2 days,  1 each am x 2 days and stop  Work on inhaler technique:        07/31/2017  f/u ov/[name] re:  [name] /AB  Chief Complaint  Patient presents with  . Follow-up    Breathing is unchanged since last OV. Reports DOE, chest tightness, wheezing and coughing.  Dyspnea:  MMRC2 = can't walk a nl pace on a flat grade s sob but does fine slow and flat eg   Cough: worse at bedtime and improved p levaquin 250 x 10 days completed 07/17/17    Sleep: cough does wake him up  SABA use:  Not using     No obvious day to day or daytime variability or assoc excess/ purulent sputum or mucus plugs or hemoptysis or cp or chest tightness, subjective wheeze or overt sinus or hb symptoms. No unusual exposure hx or h/o childhood pna/ asthma or knowledge of premature birth.  Sleeping  Ok 1-2 pillows  without nocturnal  or early am exacerbation  of respiratory  c/o's or need for noct saba. Also denies any obvious fluctuation of symptoms with weather or environmental changes or other aggravating or alleviating factors except as outlined above   Current Allergies, Complete Past Medical History, Past Surgical History, Family History, and Social History were reviewed in [employer] record.  ROS  The following are not active complaints unless bolded Hoarseness, sore throat, dysphagia, dental problems, itching, sneezing,  nasal congestion or discharge of excess mucus or purulent secretions, ear ache,   fever, chills, sweats, unintended wt loss or wt gain, classically pleuritic or exertional cp,  orthopnea pnd or arm/hand swelling  or leg swelling, presyncope, palpitations, abdominal pain, anorexia, nausea, vomiting, diarrhea  or change in bowel habits or change in bladder habits, change in stools or change in urine, dysuria, hematuria,  rash, arthralgias, visual complaints, headache, numbness, weakness or ataxia or problems with walking or coordination,  change in mood or  memory.        Current Meds  Medication Sig  . albuterol (PROVENTIL HFA;VENTOLIN HFA) 108 (90 Base) MCG/ACT inhaler Inhale 2 puffs into the lungs every 6 (six) hours as needed for wheezing or shortness of breath.  . bisacodyl (DULCOLAX) 10 MG suppository Place 10 mg rectally as needed for moderate constipation.  . bromocriptine (PARLODEL) 2.5 MG tablet Take 2.5 mg by mouth 2 (two) times daily.  . calcium carbonate (OSCAL) 1500 (600 Ca) MG TABS tablet Take 600 mg of elemental calcium by mouth daily with breakfast.   .  cholecalciferol (VITAMIN D) 1000 UNITS tablet Take 1,000 Units by mouth daily.   Marland Kitchen diltiazem (CARDIZEM CD) 120 MG 24 hr capsule TAKE (1) CAPSULE DAILY  . ferrous sulfate 325 (65 FE) MG tablet Take 325 mg by mouth daily with breakfast.   . guaiFENesin (MUCINEX) 600 MG 12 hr tablet Take 600 mg by mouth daily.   Marland Kitchen levothyroxine (SYNTHROID, LEVOTHROID) 50 MCG  tablet TAKE 1 TABLET IN MORNING  . magnesium oxide (MAG-OX) 400 (241.3 Mg) MG tablet Take 400 mg by mouth every other day.  . naphazoline-pheniramine (NAPHCON-A) 0.025-0.3 % ophthalmic solution Place 1 drop into both eyes 2 (two) times daily as needed for irritation or allergies.   . nitroGLYCERIN (NITROSTAT) 0.4 MG SL tablet Place 1 tablet (0.4 mg total) under the tongue every 5 (five) minutes as needed for chest pain.  . pantoprazole (PROTONIX) 40 MG tablet TAKE (1) TABLET TWICE A DAY BEFORE MEALS.  Marland Kitchen polyethylene glycol (MIRALAX / GLYCOLAX) packet Take 17 g by mouth daily. (Patient taking differently: Take 17 g by mouth daily as needed for moderate constipation. )  . Respiratory Therapy Supplies (FLUTTER) DEVI 1 Device by Does not apply route as needed.  . sucralfate (CARAFATE) 1 g tablet TAKE (1) TABLET FOUR TIMES DAILY AS NEEDED FOR GI UPSET (Patient taking differently: TAKE (1) TABLET FOUR TIMES DAILY)  . SYMBICORT 160-4.5 MCG/ACT inhaler Inhale 2 puffs into the lungs 2 (two) times daily.  . vitamin B-12 (CYANOCOBALAMIN) 1000 MCG tablet Take 1,000 mcg by mouth daily.  . vitamin E (VITAMIN E) 400 UNIT capsule Take 400 Units by mouth daily.  . [DISCONTINUED] montelukast (SINGULAIR) 10 MG tablet Take 1 tablet (10 mg total) by mouth daily.          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 Bonchiectasis RLL- CT 2009 > not verified on CT 07/22/10     Past Surgical History:  Inguinal herniorrhaphy  PTCA/stent  LEFT & RIGHT FOOT RECONSTRUCTION  hiatal hernia repair 2009   Family History:  neg for pituitary dz  mother died age 68 from phlebitis  father died age 28 from heart failure  8 siblings  alive age 43,80,89,92  1 died age 11 heart failure  3 died age 68,76,90 from heart problems    Social History:  married  2 children  retiired  never smoked  no etoh         Objective:   Physical Exam   amb wm, occ throat clearing/ upper airway rattling  wt 167 07/05/2010  > 168 08/24/2010 > 04/05/2011 174> 07/12/2011  176> 09/24/2012  169 > 02/25/2013  175 >  01/31/2017   150 > 04/28/2017   156 > 07/31/2017    155     06/13/16 153 lb 12.8 oz (69.8 kg)  05/10/16 155 lb (70.3 kg)  05/06/16 154 lb (69.9 kg)    Vital signs reviewed - Note on arrival 02 sats  96% on RA        mid exp rhonchi bilaterally / no localized wheezing   HEENT: nl dentition, turbinates bilaterally, and oropharynx. Nl external ear canals without cough reflex   NECK :  without JVD/Nodes/TM/ nl carotid upstrokes bilaterally   LUNGS: no acc muscle use,  Nl contour chest  mid exp rhonchi bilaterally / no localized wheezing    without cough on insp or exp maneuvers   CV:  RRR  no s3 or murmur or increase in P2, and no edema   ABD:  soft and nontender with nl inspiratory excursion in the supine position. No bruits or organomegaly appreciated, bowel sounds nl  MS:  Nl gait/ ext warm without deformities, calf tenderness, cyanosis or clubbing No obvious joint restrictions   SKIN: warm and dry without lesions    NEURO:  alert, approp, nl sensorium with  no motor or cerebellar  deficits apparent.        Labs ordered 07/31/2017  Allergy profile        Assessment & Plan:

## 2017-07-31 NOTE — Progress Notes (Signed)
PFT completed today.  

## 2017-07-31 NOTE — Patient Instructions (Addendum)
Stop singulair  Work on inhaler technique:  relax and gently blow all the way out then take a nice smooth deep breath back in, triggering the inhaler at same time you start breathing in.  Hold for up to 5 seconds if you can. Blow out thru nose. Rinse and gargle with water when done     For cough > mucinex or mucinex dm up to 1200 mg every 12 hours as needed the flutter valve as much as possible  For nasty mucus >  levaquin 500 mg daily x 7 days ok to refill    Please see patient coordinator before you leave today  to schedule DgEsophagus    Please remember to go to the lab department downstairs in the basement  for your tests - we will call you with the results when they are available.       Please schedule a follow up visit in 3 months but call sooner if needed    .

## 2017-08-01 ENCOUNTER — Encounter: Payer: Self-pay | Admitting: Internal Medicine

## 2017-08-01 LAB — RESPIRATORY ALLERGY PROFILE REGION II ~~LOC~~

## 2017-08-01 LAB — INTERPRETATION:

## 2017-08-01 NOTE — Assessment & Plan Note (Addendum)
CT w/o contrast Chest  05/20/16 1. Some clearing of peripheral consolidation in the right lower lobe. There remains slight dilatation of right lower lobe bronchi with intraluminal material in the right lower lobe bronchi. There remains slightly nodular appearing infiltrates within the right lower lobe. 2. Resolving inflammatory foci in the right middle lobe. 3. Stable 5 mm left lower lobe subpleural nodule. 4. Aneurysmal dilatation of the ascending aorta up to 4.4 cm.    Flutter valve added to rx 06/13/2016 > not using 01/31/2017 Ct chest 11/21/16 Re- demonstrated material within the right lower lobe bronchi most suggestive of aspirated material. Slight interval increase in subpleural consolidation within the medial right lower lobe which may represent combination of aspiration, infection and/or scarring FOB 02/01/17> tenacious secretions> nl flora/ no tb, no fungus on culture  Repeat CT 04/26/17  Mildly progressive chronic inflammatory/infectious changes in the lower lobe with mucous plugging. - Sinus CT 05/09/17 min sphenoidal dz   - DgEs ordered 07/31/2017    In meantime rec work on better clearance with max mucinex/ flutter and rx of airways/copd   Will add levaquin x 500mg  daily x 7 days prn flare of purulent mucus production   see avs for instructions unique to this ov

## 2017-08-01 NOTE — Assessment & Plan Note (Signed)
-   Spirometry 03/30/2010 FEV1  2.86 (116%) ratio 66  - FOB 02/01/2017  Copious retained secretions - Flutter valve re-training 04/28/2017   - PFT's  07/31/2017  FEV1 2.44 (102 % ) ratio 64  p 12 % improvement from saba p symbicort 160  prior to study with DLCO unable to perform  - 07/31/2017  After extensive coaching inhaler device  effectiveness =    75% from a baseline of 50%  Adequate control on present rx, reviewed in detail with pt > no change in rx needed    I had an extended discussion with the patient reviewing all relevant studies completed to date and  lasting 15 to 20 minutes of a 25 minute visit    See device teaching which extended face to face time for this visit.  Each maintenance medication was reviewed in detail including emphasizing most importantly the difference between maintenance and prns and under what circumstances the prns are to be triggered using an action plan format that is not reflected in the computer generated alphabetically organized AVS which I have not found useful in most complex patients, especially with respiratory illnesses  Please see AVS for specific instructions unique to this visit that I personally wrote and verbalized to the the pt in detail and then reviewed with pt  by my nurse highlighting any  changes in therapy recommended at today's visit to their plan of care.

## 2017-08-01 NOTE — Assessment & Plan Note (Addendum)
Sinus CT 05/08/2017 >>>  Min sphenoidal dz  - 07/31/2017 d/c singulair as no evidence helping - Allergy profile 07/31/2017 >  Eos 0.2 /  IgE   - DgES ordered   Upper airway cough syndrome (previously labeled PNDS),  is so named because it's frequently impossible to sort out how much is  CR/sinusitis with freq throat clearing (which can be related to primary GERD)   vs  causing  secondary (" extra esophageal")  GERD from wide swings in gastric pressure that occur with throat clearing, often  promoting self use of mint and menthol lozenges that reduce the lower esophageal sphincter tone and exacerbate the problem further in a cyclical fashion.   These are the same pts (now being labeled as having "irritable larynx syndrome" by some cough centers) who not infrequently have a history of having failed to tolerate ace inhibitors,  dry powder inhalers or biphosphonates or report having atypical/extraesophageal reflux symptoms that [place]'t respond to standard doses of PPI  and are easily confused as having aecopd or asthma flares by even experienced allergists/ pulmonologists (myself included).   Next step is Dg Es to eval for asp/ gerd

## 2017-08-02 NOTE — Progress Notes (Signed)
Spoke with pt and notified of results per Dr. Wert. Pt verbalized understanding and denied any questions. 

## 2017-08-05 ENCOUNTER — Other Ambulatory Visit: Payer: Self-pay | Admitting: Endocrinology

## 2017-08-10 ENCOUNTER — Ambulatory Visit (HOSPITAL_COMMUNITY)
Admission: RE | Admit: 2017-08-10 | Discharge: 2017-08-10 | Disposition: A | Discharge status: Home / Self Care | Payer: Medicare Other | Source: Ambulatory Visit | Attending: Internal Medicine | Admitting: Internal Medicine

## 2017-08-10 DIAGNOSIS — K222 Esophageal obstruction: Secondary | ICD-10-CM | POA: Diagnosis not present

## 2017-08-10 DIAGNOSIS — K449 Diaphragmatic hernia without obstruction or gangrene: Secondary | ICD-10-CM | POA: Diagnosis not present

## 2017-08-10 DIAGNOSIS — R9389 Abnormal findings on diagnostic imaging of other specified body structures: Secondary | ICD-10-CM | POA: Diagnosis not present

## 2017-08-10 DIAGNOSIS — J189 Pneumonia, unspecified organism: Secondary | ICD-10-CM | POA: Diagnosis not present

## 2017-08-14 ENCOUNTER — Encounter: Payer: Self-pay | Admitting: Cardiology

## 2017-08-14 ENCOUNTER — Encounter: Payer: Self-pay | Admitting: Internal Medicine

## 2017-08-14 NOTE — Telephone Encounter (Signed)
Ok for direct EGD on 08/16/17 at 10 am if works with patient Ensure he is not on home oxygen nor anticoagulation, if neither then [place] as above

## 2017-08-15 ENCOUNTER — Ambulatory Visit (AMBULATORY_SURGERY_CENTER): Payer: Self-pay | Admitting: *Deleted

## 2017-08-15 ENCOUNTER — Other Ambulatory Visit: Payer: Self-pay

## 2017-08-15 VITALS — Ht 67.0 in | Wt 155.4 lb

## 2017-08-15 DIAGNOSIS — K222 Esophageal obstruction: Secondary | ICD-10-CM

## 2017-08-15 NOTE — Progress Notes (Signed)
No egg or soy allergy known to patient  No issues with past sedation with any surgeries  or procedures, no intubation problems  No diet pills per patient No home 02 use per patient  No blood thinners per patient  Pt denies issues with constipation  Hx of A fib - on meds but no blood thinners  EMMI video sent to pt's e mail -

## 2017-08-16 ENCOUNTER — Other Ambulatory Visit: Payer: Self-pay

## 2017-08-16 ENCOUNTER — Encounter: Payer: Self-pay | Admitting: Internal Medicine

## 2017-08-16 ENCOUNTER — Ambulatory Visit (AMBULATORY_SURGERY_CENTER): Payer: Medicare Other | Admitting: Internal Medicine

## 2017-08-16 ENCOUNTER — Ambulatory Visit: Payer: Medicare Other | Admitting: Cardiology

## 2017-08-16 VITALS — BP 131/77 | HR 57 | Temp 97.5°F | Resp 11 | Ht 67.0 in | Wt 155.0 lb

## 2017-08-16 DIAGNOSIS — K219 Gastro-esophageal reflux disease without esophagitis: Secondary | ICD-10-CM | POA: Diagnosis not present

## 2017-08-16 DIAGNOSIS — K222 Esophageal obstruction: Secondary | ICD-10-CM | POA: Diagnosis not present

## 2017-08-16 DIAGNOSIS — R1310 Dysphagia, unspecified: Secondary | ICD-10-CM | POA: Diagnosis present

## 2017-08-16 DIAGNOSIS — I2510 Atherosclerotic heart disease of native coronary artery without angina pectoris: Secondary | ICD-10-CM | POA: Diagnosis not present

## 2017-08-16 MED ORDER — SODIUM CHLORIDE 0.9 % IV SOLN: 500.0000 mL | Freq: Once | INTRAVENOUS | Status: DC

## 2017-08-16 NOTE — Progress Notes (Signed)
Report to PACU, RN, vss, BBS= Clear.  

## 2017-08-16 NOTE — Op Note (Signed)
[facility] Patient Name: [name] Procedure Date: 08/16/2017 10:24 AM MRN: [id number] Endoscopist: Jerene Bears , MD Age: 84 Referring MD:  Date of Birth: 11-01-1932 Gender: Male Account #: [id number] Procedure:                Upper GI endoscopy Indications:              Dysphagia, For therapy of esophageal stricture,                            Abnormal cine-esophagram Medicines:                Monitored Anesthesia Care Procedure:                Pre-Anesthesia Assessment:                           - Prior to the procedure, a History and Physical                            was performed, and patient medications and                            allergies were reviewed. The patient's tolerance of                            previous anesthesia was also reviewed. The risks                            and benefits of the procedure and the sedation                            options and risks were discussed with the patient.                            All questions were answered, and informed consent                            was obtained. Prior Anticoagulants: The patient has                            taken no previous anticoagulant or antiplatelet                            agents. ASA Grade Assessment: III - A patient with                            severe systemic disease. After reviewing the risks                            and benefits, the patient was deemed in                            satisfactory condition to undergo the procedure.  After obtaining informed consent, the endoscope was                            passed under direct vision. Throughout the                            procedure, the patient's blood pressure, pulse, and                            oxygen saturations were monitored continuously. The                            Model GIF-HQ190 [id number]) scope was introduced                            through the mouth, and advanced  to the second part                            of duodenum. The upper GI endoscopy was                            accomplished without difficulty. The patient                            tolerated the procedure well. Scope In: Scope Out: Findings:                 One benign-appearing, intrinsic moderate                            (circumferential scarring or stenosis; an endoscope                            may pass) stenosis was found 37 cm from the                            incisors. This stenosis measured 1 cm (inner                            diameter) x 1 cm (in length). The stenosis was                            traversed. A TTS dilator was passed through the                            scope. Dilation with a 13.5-14.5-15.5 mm balloon                            dilator was performed to 15.5 mm. The dilation site                            was examined and showed moderate improvement in  luminal narrowing. Cold forceps used to further                            disrupt the ring. Estimated blood loss was minimal.                           A 3 cm hiatal hernia was present.                           The entire examined stomach was normal.                           The examined duodenum was normal. Complications:            No immediate complications. Estimated Blood Loss:     Estimated blood loss was minimal. Impression:               - Benign-appearing esophageal stenosis. Dilated to                            15.5 mm.                           - 3 cm hiatal hernia.                           - Normal stomach.                           - Normal examined duodenum.                           - No specimens collected. Recommendation:           - Patient has a contact number available for                            emergencies. The signs and symptoms of potential                            delayed complications were discussed with the                             patient. Return to normal activities tomorrow.                            Written discharge instructions were provided to the                            patient.                           - Resume previous diet.                           - Continue present medications.                           -  Repeat upper endoscopy as needed for retreatment. Jerene Bears, MD 08/16/2017 10:51:43 AM This report has been signed electronically.

## 2017-08-16 NOTE — Patient Instructions (Addendum)
Please read handout on hiatal hernia. Begin dilation diet, nothing to eat til 12pm today, then clear liquids from 12pm to 1 pm,  then soft diet rest of today.  May resume regular diet tomorrow.    YOU HAD AN ENDOSCOPIC PROCEDURE TODAY AT [facility]:   Refer to the procedure report that was given to you for any specific questions about what was found during the examination.  If the procedure report does not answer your questions, please call your gastroenterologist to clarify.  If you requested that your care partner not be given the details of your procedure findings, then the procedure report has been included in a sealed envelope for you to review at your convenience later.  YOU SHOULD EXPECT: Some feelings of bloating in the abdomen. Passage of more gas than usual.  Walking can help get rid of the air that was put into your GI tract during the procedure and reduce the bloating. If you had a lower endoscopy (such as a colonoscopy or flexible sigmoidoscopy) you may notice spotting of blood in your stool or on the toilet paper. If you underwent a bowel prep for your procedure, you may not have a normal bowel movement for a few days.  Please Note:  You might notice some irritation and congestion in your nose or some drainage.  This is from the oxygen used during your procedure.  There is no need for concern and it should clear up in a day or so.  SYMPTOMS TO REPORT IMMEDIATELY:      For urgent or emergent issues, a gastroenterologist can be reached at any hour by calling [phone].   DIET:  We do recommend a small meal at first, but then you may proceed to your regular diet.  Drink plenty of fluids but you should avoid alcoholic beverages for 24 hours.  ACTIVITY:  You should plan to take it easy for the rest of today and you should NOT DRIVE or use heavy machinery until tomorrow (because of the sedation medicines used during the test).    FOLLOW UP: Our staff will call  the number listed on your records the next business day following your procedure to check on you and address any questions or concerns that you may have regarding the information given to you following your procedure. If we do not reach you, we will leave a message.  However, if you are feeling well and you are not experiencing any problems, there is no need to return our call.  We will assume that you have returned to your regular daily activities without incident.  If any biopsies were taken you will be contacted by phone or by letter within the next 1-3 weeks.  Please call us at [phone] if you have not heard about the biopsies in 3 weeks.    SIGNATURES/CONFIDENTIALITY: You and/or your care partner have signed paperwork which will be entered into your electronic medical record.  These signatures attest to the fact that that the information above on your After Visit Summary has been reviewed and is understood.  Full responsibility of the confidentiality of this discharge information lies with you and/or your care-partner.YOU HAD AN ENDOSCOPIC PROCEDURE TODAY AT [facility]:   Refer to the procedure report that was given to you for any specific questions about what was found during the examination.  If the procedure report does not answer your questions, please call your gastroenterologist to clarify.  If you requested that  your care partner not be given the details of your procedure findings, then the procedure report has been included in a sealed envelope for you to review at your convenience later.  YOU SHOULD EXPECT: Some feelings of bloating in the abdomen. Passage of more gas than usual.  Walking can help get rid of the air that was put into your GI tract during the procedure and reduce the bloating. If you had a lower endoscopy (such as a colonoscopy or flexible sigmoidoscopy) you may notice spotting of blood in your stool or on the toilet paper. If you underwent a bowel prep  for your procedure, you may not have a normal bowel movement for a few days.  Please Note:  You might notice some irritation and congestion in your nose or some drainage.  This is from the oxygen used during your procedure.  There is no need for concern and it should clear up in a day or so.  SYMPTOMS TO REPORT IMMEDIATELY:    Following upper endoscopy (EGD)  Vomiting of blood or coffee ground material  New chest pain or pain under the shoulder blades  Painful or persistently difficult swallowing  New shortness of breath  Fever of 100F or higher  Black, tarry-looking stools  For urgent or emergent issues, a gastroenterologist can be reached at any hour by calling [phone].   DIET:  We do recommend a small meal at first, but then you may proceed to your regular diet.  Drink plenty of fluids but you should avoid alcoholic beverages for 24 hours.  ACTIVITY:  You should plan to take it easy for the rest of today and you should NOT DRIVE or use heavy machinery until tomorrow (because of the sedation medicines used during the test).    FOLLOW UP: Our staff will call the number listed on your records the next business day following your procedure to check on you and address any questions or concerns that you may have regarding the information given to you following your procedure. If we do not reach you, we will leave a message.  However, if you are feeling well and you are not experiencing any problems, there is no need to return our call.  We will assume that you have returned to your regular daily activities without incident.  If any biopsies were taken you will be contacted by phone or by letter within the next 1-3 weeks.  Please call us at [phone] if you have not heard about the biopsies in 3 weeks.    SIGNATURES/CONFIDENTIALITY: You and/or your care partner have signed paperwork which will be entered into your electronic medical record.  These signatures attest to the fact  that that the information above on your After Visit Summary has been reviewed and is understood.  Full responsibility of the confidentiality of this discharge information lies with you and/or your care-partner.YOU HAD AN ENDOSCOPIC PROCEDURE TODAY AT [facility]:   Refer to the procedure report that was given to you for any specific questions about what was found during the examination.  If the procedure report does not answer your questions, please call your gastroenterologist to clarify.  If you requested that your care partner not be given the details of your procedure findings, then the procedure report has been included in a sealed envelope for you to review at your convenience later.  YOU SHOULD EXPECT: Some feelings of bloating in the abdomen. Passage of more gas than usual.  Walking can  help get rid of the air that was put into your GI tract during the procedure and reduce the bloating. If you had a lower endoscopy (such as a colonoscopy or flexible sigmoidoscopy) you may notice spotting of blood in your stool or on the toilet paper. If you underwent a bowel prep for your procedure, you may not have a normal bowel movement for a few days.  Please Note:  You might notice some irritation and congestion in your nose or some drainage.  This is from the oxygen used during your procedure.  There is no need for concern and it should clear up in a day or so.  SYMPTOMS TO REPORT IMMEDIATELY:    Following upper endoscopy (EGD)  Vomiting of blood or coffee ground material  New chest pain or pain under the shoulder blades  Painful or persistently difficult swallowing  New shortness of breath  Fever of 100F or higher  Black, tarry-looking stools  For urgent or emergent issues, a gastroenterologist can be reached at any hour by calling [phone].   DIET:  We do recommend a small meal at first, but then you may proceed to your regular diet.  Drink plenty of fluids but you should  avoid alcoholic beverages for 24 hours.  ACTIVITY:  You should plan to take it easy for the rest of today and you should NOT DRIVE or use heavy machinery until tomorrow (because of the sedation medicines used during the test).    FOLLOW UP: Our staff will call the number listed on your records the next business day following your procedure to check on you and address any questions or concerns that you may have regarding the information given to you following your procedure. If we do not reach you, we will leave a message.  However, if you are feeling well and you are not experiencing any problems, there is no need to return our call.  We will assume that you have returned to your regular daily activities without incident.  If any biopsies were taken you will be contacted by phone or by letter within the next 1-3 weeks.  Please call us at [phone] if you have not heard about the biopsies in 3 weeks.    SIGNATURES/CONFIDENTIALITY: You and/or your care partner have signed paperwork which will be entered into your electronic medical record.  These signatures attest to the fact that that the information above on your After Visit Summary has been reviewed and is understood.  Full responsibility of the confidentiality of this discharge information lies with you and/or your care-partner.

## 2017-08-16 NOTE — Progress Notes (Signed)
Called to room to assist during endoscopic procedure.  Patient ID and intended procedure confirmed with present staff. Received instructions for my participation in the procedure from the performing physician.  

## 2017-08-17 ENCOUNTER — Telehealth: Payer: Self-pay

## 2017-08-17 NOTE — Telephone Encounter (Signed)
  Follow up Call-  Call back number 08/16/2017 07/05/2016 05/03/2016  Post procedure Call Back phone  # [phone]  Permission to leave phone message Yes Yes Yes  Some recent data might be hidden     Patient questions:  Do you have a fever, pain , or abdominal swelling? No. Pain Score  0 *  Have you tolerated food without any problems? Yes.    Have you been able to return to your normal activities? Yes.    Do you have any questions about your discharge instructions: Diet   No. Medications  No. Follow up visit  No.  Do you have questions or concerns about your Care? No.  Actions:  Pt. Reports he is pleased how everything went. * If pain score is 4 or above: No action needed, pain <4.

## 2017-08-24 ENCOUNTER — Ambulatory Visit: Payer: Medicare Other | Admitting: Nurse Practitioner

## 2017-09-04 ENCOUNTER — Encounter: Payer: Self-pay | Admitting: Family

## 2017-09-04 ENCOUNTER — Ambulatory Visit (INDEPENDENT_AMBULATORY_CARE_PROVIDER_SITE_OTHER): Payer: Medicare Other | Admitting: Family

## 2017-09-04 ENCOUNTER — Ambulatory Visit (INDEPENDENT_AMBULATORY_CARE_PROVIDER_SITE_OTHER): Payer: Medicare Other

## 2017-09-04 VITALS — BP 120/73 | HR 78 | Temp 97.2°F | Ht 67.0 in | Wt 153.6 lb

## 2017-09-04 DIAGNOSIS — M549 Dorsalgia, unspecified: Secondary | ICD-10-CM | POA: Diagnosis not present

## 2017-09-04 DIAGNOSIS — M546 Pain in thoracic spine: Secondary | ICD-10-CM

## 2017-09-04 DIAGNOSIS — G8929 Other chronic pain: Secondary | ICD-10-CM

## 2017-09-04 MED ORDER — DICLOFENAC SODIUM 75 MG PO TBEC: 75.0000 mg | DELAYED_RELEASE_TABLET | Freq: Two times a day (BID) | ORAL | 0 refills | Status: DC

## 2017-09-04 MED ORDER — PREDNISONE 10 MG (21) PO TBPK: ORAL_TABLET | ORAL | 0 refills | Status: DC

## 2017-09-04 NOTE — Progress Notes (Signed)
   Subjective:    Patient ID: [name], male    DOB: [birth year], 84 y.o.   MRN: [id number]  Chief Complaint  Patient presents with  . Back Pain    building a dog lot    Back Pain  This is a recurrent problem. The current episode started in the past 7 days. The problem occurs constantly. The problem has been gradually worsening since onset. The pain is present in the lumbar spine and thoracic spine. The pain is at a severity of 10/10. The pain is moderate. The symptoms are aggravated by bending and standing.      Review of Systems  Musculoskeletal: Positive for back pain.       Objective:   Physical Exam  Constitutional: He is oriented to person, place, and time. He appears well-developed and well-nourished. No distress.  HENT:  Head: Normocephalic.  Right Ear: External ear normal.  Left Ear: External ear normal.  Mouth/Throat: Oropharynx is clear and moist.  Eyes: Pupils are equal, round, and reactive to light. Right eye exhibits no discharge. Left eye exhibits no discharge.  Neck: Normal range of motion. Neck supple. No thyromegaly present.  Cardiovascular: Normal rate, regular rhythm, normal heart sounds and intact distal pulses.  No murmur heard. Pulmonary/Chest: Effort normal and breath sounds normal. No respiratory distress. He has no wheezes.  Abdominal: Soft. Bowel sounds are normal. He exhibits no distension. There is no tenderness.  Musculoskeletal: Normal range of motion. He exhibits tenderness. He exhibits no edema.  Pain with flexion or extension  Neurological: He is alert and oriented to person, place, and time. He has normal reflexes. No cranial nerve deficit.  Skin: Skin is warm and dry. No rash noted. No erythema.  Psychiatric: He has a normal mood and affect. His behavior is normal. Judgment and thought content normal.  Vitals reviewed.     BP 120/73   Pulse 78   Temp (!) 97.2 F (36.2 C) (Oral)   Ht 5\' 7"  (1.702 m)   Wt 153 lb 9.6 oz (69.7 kg)    BMI 24.06 kg/m      Assessment & Plan:  [name] was seen today for back pain.  Diagnoses and all orders for this visit:  Chronic bilateral thoracic back pain -     DG Lumbar Spine 2-3 Views; Future -     DG Thoracic Spine 2 View; Future -     predniSONE (STERAPRED UNI-PAK 21 TAB) 10 MG (21) TBPK tablet; Use as directed -     diclofenac (VOLTAREN) 75 MG EC tablet; Take 1 tablet (75 mg total) by mouth 2 (two) times daily.   Rest Ice  ROM exercises No other NSAID's while taking diclofenac RTO if symptoms worsen or do not improve  Evelina Dun, FNP

## 2017-09-04 NOTE — Patient Instructions (Signed)

## 2017-09-14 ENCOUNTER — Other Ambulatory Visit: Payer: Self-pay | Admitting: Internal Medicine

## 2017-09-14 ENCOUNTER — Other Ambulatory Visit: Payer: Self-pay | Admitting: Endocrinology

## 2017-09-15 ENCOUNTER — Ambulatory Visit (INDEPENDENT_AMBULATORY_CARE_PROVIDER_SITE_OTHER): Payer: Medicare Other | Admitting: Family

## 2017-09-15 ENCOUNTER — Encounter: Payer: Self-pay | Admitting: Family

## 2017-09-15 VITALS — BP 120/73 | HR 75 | Temp 96.9°F | Ht 67.0 in | Wt 154.8 lb

## 2017-09-15 DIAGNOSIS — T148XXA Other injury of unspecified body region, initial encounter: Secondary | ICD-10-CM

## 2017-09-15 DIAGNOSIS — J441 Chronic obstructive pulmonary disease with (acute) exacerbation: Secondary | ICD-10-CM | POA: Diagnosis not present

## 2017-09-15 DIAGNOSIS — R0781 Pleurodynia: Secondary | ICD-10-CM | POA: Diagnosis not present

## 2017-09-15 MED ORDER — PREDNISONE 10 MG (21) PO TBPK
ORAL_TABLET | ORAL | 0 refills | Status: DC
Start: 2017-09-15 — End: 2017-09-25

## 2017-09-15 MED ORDER — AZITHROMYCIN 250 MG PO TABS: ORAL_TABLET | ORAL | 0 refills | Status: DC

## 2017-09-15 NOTE — Progress Notes (Signed)
   Subjective:    Patient ID: [name], male    DOB: Mar 27, 1932, 84 y.o.   MRN: [id number]  Chief Complaint  Patient presents with  . rib pain no better   Pt presents to the office today with bilateral rib pain that started after he was building a dog lot. Pt was seen in the office on 09/04/17 and had negative lumbar and thoracic x-ray. States his back is greatly improved, but his rib pain continues an aching pain of 7or 8 out 10.  Cough  This is a new problem. The current episode started yesterday. The problem has been waxing and waning. The problem occurs every few minutes. The cough is productive of sputum. Associated symptoms include wheezing. Pertinent negatives include no chills, ear congestion, ear pain, myalgias, nasal congestion, sore throat or sweats. The treatment provided mild relief. His past medical history is significant for COPD.      Review of Systems  Constitutional: Negative for chills.  HENT: Negative for ear pain and sore throat.   Respiratory: Positive for cough and wheezing.   Musculoskeletal: Negative for myalgias.  All other systems reviewed and are negative.      Objective:   Physical Exam  Constitutional: He is oriented to person, place, and time. He appears well-developed and well-nourished. No distress.  HENT:  Head: Normocephalic.  Eyes: Pupils are equal, round, and reactive to light. Right eye exhibits no discharge. Left eye exhibits no discharge.  Neck: Normal range of motion. Neck supple. No thyromegaly present.  Cardiovascular: Normal rate, regular rhythm, normal heart sounds and intact distal pulses.  No murmur heard. Pulmonary/Chest: Effort normal. No respiratory distress. He has wheezes.  Abdominal: Soft. Bowel sounds are normal. He exhibits no distension. There is no tenderness.  Musculoskeletal: Normal range of motion. He exhibits no edema or tenderness.  Neurological: He is alert and oriented to person, place, and time. He has normal  reflexes. No cranial nerve deficit.  Skin: Skin is warm and dry. No rash noted. No erythema.  Psychiatric: He has a normal mood and affect. His behavior is normal. Judgment and thought content normal.  Vitals reviewed.     BP 120/73   Pulse 75   Temp (!) 96.9 F (36.1 C) (Oral)   Ht 5\' 7"  (1.702 m)   Wt 154 lb 12.8 oz (70.2 kg)   BMI 24.25 kg/m      Assessment & Plan:  [name] comes in today with chief complaint of rib pain no better   Diagnosis and orders addressed:  1. Rib pain Rest Ice - predniSONE (STERAPRED UNI-PAK 21 TAB) 10 MG (21) TBPK tablet; Use as directed  Dispense: 21 tablet; Refill: 0  2. COPD exacerbation (HCC) - azithromycin (ZITHROMAX Z-PAK) 250 MG tablet; As directed  Dispense: 1 each; Refill: 0 - predniSONE (STERAPRED UNI-PAK 21 TAB) 10 MG (21) TBPK tablet; Use as directed  Dispense: 21 tablet; Refill: 0  3. Muscle strain - predniSONE (STERAPRED UNI-PAK 21 TAB) 10 MG (21) TBPK tablet; Use as directed  Dispense: 21 tablet; Refill: 0  RTO if pain worsens    Evelina Dun, FNP

## 2017-09-15 NOTE — Patient Instructions (Signed)
Muscle Strain A muscle strain is an injury that occurs when a muscle is stretched beyond its normal length. Usually a small number of muscle fibers are torn when this happens. Muscle strain is rated in degrees. First-degree strains have the least amount of muscle fiber tearing and pain. Second-degree and third-degree strains have increasingly more tearing and pain. Usually, recovery from muscle strain takes 1-2 weeks. Complete healing takes 5-6 weeks. What are the causes? Muscle strain happens when a sudden, violent force placed on a muscle stretches it too far. This may occur with lifting, sports, or a fall. What increases the risk? Muscle strain is especially common in athletes. What are the signs or symptoms? At the site of the muscle strain, there may be:  Pain.  Bruising.  Swelling.  Difficulty using the muscle due to pain or lack of normal function.  How is this diagnosed? Your health care provider will perform a physical exam and ask about your medical history. How is this treated? Often, the best treatment for a muscle strain is resting, icing, and applying cold compresses to the injured area. Follow these instructions at home:  Use the PRICE method of treatment to promote muscle healing during the first 2-3 days after your injury. The PRICE method involves: ? Protecting the muscle from being injured again. ? Restricting your activity and resting the injured body part. ? Icing your injury. To do this, put ice in a plastic bag. Place a towel between your skin and the bag. Then, apply the ice and leave it on from 15-20 minutes each hour. After the third day, switch to moist heat packs. ? Apply compression to the injured area with a splint or elastic bandage. Be careful not to wrap it too tightly. This may interfere with blood circulation or increase swelling. ? Elevate the injured body part above the level of your heart as often as you can.  Only take over-the-counter or  prescription medicines for pain, discomfort, or fever as directed by your health care provider.  Warming up prior to exercise helps to prevent future muscle strains. Contact a health care provider if:  You have increasing pain or swelling in the injured area.  You have numbness, tingling, or a significant loss of strength in the injured area. This information is not intended to replace advice given to you by your health care provider. Make sure you discuss any questions you have with your health care provider. Document Released: 02/14/2005 Document Revised: 07/23/2015 Document Reviewed: 09/13/2012 Elsevier Interactive Patient Education  2017 [employer]  

## 2017-09-18 ENCOUNTER — Encounter: Payer: Self-pay | Admitting: Family

## 2017-09-20 ENCOUNTER — Other Ambulatory Visit: Payer: Self-pay | Admitting: Physician Assistant

## 2017-09-20 ENCOUNTER — Encounter: Payer: Self-pay | Admitting: Family Medicine

## 2017-09-20 ENCOUNTER — Ambulatory Visit (HOSPITAL_COMMUNITY)
Admission: RE | Admit: 2017-09-20 | Discharge: 2017-09-20 | Disposition: A | Discharge status: Home / Self Care | Payer: Medicare Other | Source: Ambulatory Visit | Attending: Family Medicine | Admitting: Family Medicine

## 2017-09-20 ENCOUNTER — Ambulatory Visit (INDEPENDENT_AMBULATORY_CARE_PROVIDER_SITE_OTHER): Payer: Medicare Other | Admitting: Family Medicine

## 2017-09-20 VITALS — BP 104/54 | HR 67 | Temp 97.4°F | Ht 67.0 in | Wt 157.2 lb

## 2017-09-20 DIAGNOSIS — R1031 Right lower quadrant pain: Secondary | ICD-10-CM | POA: Diagnosis not present

## 2017-09-20 DIAGNOSIS — I700 Atherosclerosis of aorta: Secondary | ICD-10-CM | POA: Diagnosis not present

## 2017-09-20 DIAGNOSIS — K5730 Diverticulosis of large intestine without perforation or abscess without bleeding: Secondary | ICD-10-CM | POA: Insufficient documentation

## 2017-09-20 LAB — URINALYSIS, COMPLETE
Bilirubin, UA: NEGATIVE
Glucose, UA: NEGATIVE
Leukocytes, UA: NEGATIVE
Nitrite, UA: NEGATIVE
RBC, UA: NEGATIVE
Specific Gravity, UA: 1.025 (ref 1.005–1.030)
Urobilinogen, Ur: 1 mg/dL (ref 0.2–1.0)
pH, UA: 5.5 (ref 5.0–7.5)

## 2017-09-20 LAB — MICROSCOPIC EXAMINATION
RBC, UA: NONE SEEN /hpf (ref 0–2)
Renal Epithel, UA: NONE SEEN /hpf

## 2017-09-20 LAB — POCT I-STAT CREATININE: Creatinine, Ser: 0.9 mg/dL (ref 0.61–1.24)

## 2017-09-20 MED ORDER — METRONIDAZOLE 500 MG PO TABS: 500.0000 mg | ORAL_TABLET | Freq: Three times a day (TID) | ORAL | 0 refills | Status: DC

## 2017-09-20 MED ORDER — IOPAMIDOL (ISOVUE-300) INJECTION 61%
100.0000 mL | Freq: Once | INTRAVENOUS | Status: AC | PRN
  Administered 2017-09-20: 100 mL via INTRAVENOUS

## 2017-09-20 MED ORDER — CIPROFLOXACIN HCL 500 MG PO TABS: 500.0000 mg | ORAL_TABLET | Freq: Two times a day (BID) | ORAL | 0 refills | Status: DC

## 2017-09-20 NOTE — Patient Instructions (Signed)
Great to meet you!   Abdominal Pain, Adult Many things can cause belly (abdominal) pain. Most times, belly pain is not dangerous. Many cases of belly pain can be watched and treated at home. Sometimes belly pain is serious, though. Your doctor will try to find the cause of your belly pain. Follow these instructions at home:  Take over-the-counter and prescription medicines only as told by your doctor. Do not take medicines that help you poop (laxatives) unless told to by your doctor.  Drink enough fluid to keep your pee (urine) clear or pale yellow.  Watch your belly pain for any changes.  Keep all follow-up visits as told by your doctor. This is important. Contact a doctor if:  Your belly pain changes or gets worse.  You are not hungry, or you lose weight without trying.  You are having trouble pooping (constipated) or have watery poop (diarrhea) for more than 2-3 days.  You have pain when you pee or poop.  Your belly pain wakes you up at night.  Your pain gets worse with meals, after eating, or with certain foods.  You are throwing up and cannot keep anything down.  You have a fever. Get help right away if:  Your pain does not go away as soon as your doctor says it should.  You cannot stop throwing up.  Your pain is only in areas of your belly, such as the right side or the left lower part of the belly.  You have bloody or black poop, or poop that looks like tar.  You have very bad pain, cramping, or bloating in your belly.  You have signs of not having enough fluid or water in your body (dehydration), such as: ? Dark pee, very little pee, or no pee. ? Cracked lips. ? Dry mouth. ? Sunken eyes. ? Sleepiness. ? Weakness. This information is not intended to replace advice given to you by your health care provider. Make sure you discuss any questions you have with your health care provider. Document Released: 08/03/2007 Document Revised: 09/04/2015 Document Reviewed:  07/29/2015 Elsevier Interactive Patient Education  2018 [employer].

## 2017-09-20 NOTE — Progress Notes (Signed)
   HPI  Patient presents today with right lower quadrant abdominal pain.  Patient explains that this morning he began having right lower quadrant abdominal pain, throughout the day is continued to get worse and worse.  He describes it as sharp and persistent.  He felt that it might be due to gas and has had a good bowel movement and flatulence without improvement.  Patient denies any fever, chills, sweats. He has some left rib cage pain chronically and states that this is much more severe.  He states that this is not a typical type of pain that he encounters.  PMH: Smoking status noted ROS: Per HPI  Objective: BP (!) 104/54   Pulse 67   Temp (!) 97.4 F (36.3 C) (Oral)   Ht '5\' 7"'$  (1.702 m)   Wt 157 lb 3.2 oz (71.3 kg)   BMI 24.62 kg/m  Gen: NAD, alert, cooperative with exam HEENT: NCAT CV: RRR, good S1/S2, no murmur Resp: CTABL, no wheezes, non-labored Abd: Soft, tender to palpation in the right lower quadrant and right flank, no periumbilical pain to palpation, no rebound tenderness, positive bowel sounds Ext: No edema, warm Neuro: Alert and oriented, No gross deficits  Assessment and plan:  #Right lower quadrant abdominal pain Urinalysis is negative, no signs of hematuria to cause concern for kidney stone Considering his impressive tenderness on exam I have ordered CT to rule out appendicitis    Orders Placed This Encounter  Procedures  . CT Abdomen Pelvis W Contrast    Standing Status:   Future    Standing Expiration Date:   12/22/2018    Order Specific Question:   If indicated for the ordered procedure, I authorize the administration of contrast media per Radiology protocol    Answer:   Yes    Order Specific Question:   Preferred imaging location?    Answer:   [facility]    Order Specific Question:   Is Oral Contrast requested for this exam?    Answer:   Yes, Per Radiology protocol    Order Specific Question:   Radiology Contrast Protocol - do NOT  remove file path    Answer:   [email or web address]  . Urinalysis, Complete  . CBC with Differential/Platelet  . Keyser, MD [facility] 09/20/2017, 1:53 PM

## 2017-09-21 ENCOUNTER — Encounter: Payer: Self-pay | Admitting: Family Medicine

## 2017-09-21 LAB — CBC WITH DIFFERENTIAL/PLATELET
Basophils Absolute: 0 10*3/uL (ref 0.0–0.2)
Basos: 0 %
EOS (ABSOLUTE): 0.3 10*3/uL (ref 0.0–0.4)
Eos: 2 %
Hematocrit: 36.9 % — ABNORMAL LOW (ref 37.5–51.0)
Hemoglobin: 11.5 g/dL — ABNORMAL LOW (ref 13.0–17.7)
Lymphocytes Absolute: 1.7 10*3/uL (ref 0.7–3.1)
Lymphs: 13 %
MCH: 30.1 pg (ref 26.6–33.0)
MCHC: 31.2 g/dL — ABNORMAL LOW (ref 31.5–35.7)
MCV: 97 fL (ref 79–97)
Monocytes Absolute: 2 10*3/uL — ABNORMAL HIGH (ref 0.1–0.9)
Monocytes: 15 %
Neutrophils Absolute: 9.1 10*3/uL — ABNORMAL HIGH (ref 1.4–7.0)
Neutrophils: 68 %
Platelets: 285 10*3/uL (ref 150–450)
RBC: 3.82 x10E6/uL — ABNORMAL LOW (ref 4.14–5.80)
RDW: 15.3 % (ref 12.3–15.4)
WBC: 13.4 10*3/uL — ABNORMAL HIGH (ref 3.4–10.8)

## 2017-09-21 LAB — CMP14+EGFR
ALT: 28 IU/L (ref 0–44)
AST: 23 IU/L (ref 0–40)
Albumin/Globulin Ratio: 1.5 (ref 1.2–2.2)
Albumin: 3.4 g/dL — ABNORMAL LOW (ref 3.5–4.7)
Alkaline Phosphatase: 103 IU/L (ref 39–117)
BUN/Creatinine Ratio: 19 (ref 10–24)
BUN: 17 mg/dL (ref 8–27)
Bilirubin Total: 0.4 mg/dL (ref 0.0–1.2)
CO2: 24 mmol/L (ref 20–29)
Calcium: 8.3 mg/dL — ABNORMAL LOW (ref 8.6–10.2)
Chloride: 106 mmol/L (ref 96–106)
Creatinine, Ser: 0.9 mg/dL (ref 0.76–1.27)
GFR calc Af Amer: 91 mL/min/{1.73_m2} (ref >59)
GFR calc non Af Amer: 79 mL/min/{1.73_m2} (ref >59)
Globulin, Total: 2.2 g/dL (ref 1.5–4.5)
Glucose: 128 mg/dL — ABNORMAL HIGH (ref 65–99)
Potassium: 4.2 mmol/L (ref 3.5–5.2)
Sodium: 141 mmol/L (ref 134–144)
Total Protein: 5.6 g/dL — ABNORMAL LOW (ref 6.0–8.5)

## 2017-09-21 LAB — IMMATURE CELLS
MYELOCYTES: 1 % — ABNORMAL HIGH (ref 0–0)
Metamyelocytes: 1 % — ABNORMAL HIGH (ref 0–0)

## 2017-09-25 ENCOUNTER — Ambulatory Visit (INDEPENDENT_AMBULATORY_CARE_PROVIDER_SITE_OTHER): Payer: Medicare Other

## 2017-09-25 ENCOUNTER — Encounter: Payer: Self-pay | Admitting: Pediatrics

## 2017-09-25 ENCOUNTER — Ambulatory Visit (INDEPENDENT_AMBULATORY_CARE_PROVIDER_SITE_OTHER): Payer: Medicare Other | Admitting: Pediatrics

## 2017-09-25 VITALS — BP 111/69 | HR 74 | Temp 97.6°F | Ht 67.0 in | Wt 152.6 lb

## 2017-09-25 DIAGNOSIS — R0781 Pleurodynia: Secondary | ICD-10-CM

## 2017-09-25 DIAGNOSIS — M40209 Unspecified kyphosis, site unspecified: Secondary | ICD-10-CM

## 2017-09-25 NOTE — Progress Notes (Signed)
  Subjective:   Patient ID: [name], male    DOB: [birth year], 84 y.o.   MRN: [id number] CC: Chest Pain (Right)  HPI: [name] is a 84 y.o. male   With digging post holes 3 weeks ago, thinks he overdid it, and his lower ribs since then.  Letter still bother him takes deep breath.  Still very tender left lower ribs.  He has broken ribs before, he is not sure which ones.  Seen 5 days ago for abdominal pain, diagnosed with diverticulitis.  His appetite is improved, abdominal pain is improved since starting antibiotics for treatment.  He has mid back pain bothers him daily.  He was followed by Dr. Saintclair Halsted, he asks about seeing him for this back pain as well.  Injections in his neck helped a lot.  He is wondering if that would help with his thoracic pain to.  He is not able to wear the back brace regularly because of pain in his ribs as above.  He says when he stands straight his pain is improved versus when asked over.  Relevant past medical, surgical, family and social history reviewed. Allergies and medications reviewed and updated. Social History   Tobacco Use  Smoking Status Never Smoker  Smokeless Tobacco Never Used   ROS: Per HPI   Objective:    BP 111/69   Pulse 74   Temp 97.6 F (36.4 C) (Oral)   Ht 5\' 7"  (1.702 m)   Wt 152 lb 9.6 oz (69.2 kg)   BMI 23.90 kg/m   Wt Readings from Last 3 Encounters:  09/25/17 152 lb 9.6 oz (69.2 kg)  09/20/17 157 lb 3.2 oz (71.3 kg)  09/15/17 154 lb 12.8 oz (70.2 kg)    Gen: NAD, alert, cooperative with exam, NCAT EYES: EOMI, no conjunctival injection, or no icterus CV: NRRR, normal S1/S2, no murmur, distal pulses 2+ b/l Resp: CTABL, no wheezes, normal WOB Abd: +BS, soft, NTND. no guarding or organomegaly Ext: No edema, warm Neuro: Alert and oriented MSK: Tender to palpation over left lower ribs.  No step-offs.  Mild tender to palpation along mid thoracic spine.  Assessment & Plan:  [name] was seen today for rib pain.  No acute  fractures on x-ray.  Recent increase in physical activity, is going to take more frequent breaks in the future.  No chest tightness with activity.  He does stabbing more thoracic back pain.  He would like to see the neurosurgeon he saw before for his back pain.  Referral in.  Diagnoses and all orders for this visit:  Kyphosis, unspecified kyphosis type, unspecified spinal region -     Ambulatory referral to Neurosurgery  Rib pain on left side -     DG Ribs Unilateral W/Chest Left; Future   Follow up plan: Return if symptoms worsen or fail to improve. Assunta Found, MD [place]

## 2017-10-04 ENCOUNTER — Ambulatory Visit: Payer: Medicare Other | Admitting: Cardiology

## 2017-10-05 ENCOUNTER — Encounter: Payer: Self-pay | Admitting: Cardiology

## 2017-10-05 NOTE — Progress Notes (Signed)
HPI The patient presents for followup of his known coronary disease. He had a negative stress perfusion study in 2017.  He had no significant abnormalities. He is here for follow up.  Recently had urgent endoscopy for diverticulitis in June, just finished course of antibiotics. Was recently working in the yard putting up fences and began to have rib pain, but otherwise has been doing fine. Denies difficulty breathing or chest pain, tightness, or pressure. Does not feel he is in afib. No orthopnea, PND, or leg swelling.   Did have CT recently had showed ascending aorta at 4.5 cm, he is followed with Dr. Cyndia Bent for this. Otherwise, he has been socially active. He has made a new friend, his neighbor, and keeps in touch with her.    Allergies  Allergen Reactions  . Betadine [Povidone Iodine] Other (See Comments)    Reaction:  Blisters   . Lortab [Hydrocodone-Acetaminophen] Nausea And Vomiting  . Penicillins Other (See Comments)    Reaction:  Lightheadedness  Has patient had a PCN reaction causing immediate rash, facial/tongue/throat swelling, SOB or lightheadedness with hypotension: Yes Has patient had a PCN reaction causing severe rash involving mucus membranes or skin necrosis: No Has patient had a PCN reaction that required hospitalization No Has patient had a PCN reaction occurring within the last 10 years: No If all of the above answers are "NO", then may proceed with Cephalosporin use.  Marland Kitchen Zetia [Ezetimibe] Other (See Comments)    Reaction:  Muscle weakness   . Zocor [Simvastatin] Nausea Only and Other (See Comments)    Reaction:  Muscle weakness     Current Outpatient Medications  Medication Sig Dispense Refill  . albuterol (PROVENTIL HFA;VENTOLIN HFA) 108 (90 Base) MCG/ACT inhaler Inhale 2 puffs into the lungs every 6 (six) hours as needed for wheezing or shortness of breath. 18 g 11  . bisacodyl (DULCOLAX) 10 MG suppository Place 10 mg rectally as needed for moderate  constipation.    . bromocriptine (PARLODEL) 2.5 MG tablet Take 2.5 mg by mouth 2 (two) times daily.    . calcium carbonate (OSCAL) 1500 (600 Ca) MG TABS tablet Take 600 mg of elemental calcium by mouth daily with breakfast.     . cholecalciferol (VITAMIN D) 1000 UNITS tablet Take 1,000 Units by mouth daily.     . diclofenac (VOLTAREN) 75 MG EC tablet Take 1 tablet (75 mg total) by mouth 2 (two) times daily. 30 tablet 0  . diltiazem (CARDIZEM CD) 120 MG 24 hr capsule TAKE (1) CAPSULE DAILY 90 capsule 0  . ferrous sulfate 325 (65 FE) MG tablet Take 325 mg by mouth daily with breakfast.     . guaiFENesin (MUCINEX) 600 MG 12 hr tablet Take 600 mg by mouth daily.     Marland Kitchen levothyroxine (SYNTHROID, LEVOTHROID) 50 MCG tablet TAKE 1 TABLET IN MORNING 30 tablet 0  . magnesium oxide (MAG-OX) 400 (241.3 Mg) MG tablet Take 400 mg by mouth every other day.    . naphazoline-pheniramine (NAPHCON-A) 0.025-0.3 % ophthalmic solution Place 1 drop into both eyes 2 (two) times daily as needed for irritation or allergies.     . nitroGLYCERIN (NITROSTAT) 0.4 MG SL tablet Place 1 tablet (0.4 mg total) under the tongue every 5 (five) minutes as needed for chest pain. 25 tablet 2  . pantoprazole (PROTONIX) 40 MG tablet TAKE (1) TABLET TWICE A DAY BEFORE MEALS. 180 tablet 0  . polyethylene glycol (MIRALAX / GLYCOLAX) packet Take 17 g by  mouth daily. (Patient taking differently: Take 17 g by mouth daily as needed for moderate constipation. ) 30 each 3  . sucralfate (CARAFATE) 1 g tablet TAKE (1) TABLET FOUR TIMES DAILY AS NEEDED FOR GI UPSET 120 tablet 0  . vitamin B-12 (CYANOCOBALAMIN) 1000 MCG tablet Take 1,000 mcg by mouth daily.    . vitamin E (VITAMIN E) 400 UNIT capsule Take 400 Units by mouth daily.    Marland Kitchen Respiratory Therapy Supplies (FLUTTER) DEVI 1 Device by Does not apply route as needed. 1 each 0   No current facility-administered medications for this visit.     Past Medical History:  Diagnosis Date  .  Abnormality of gait 06/07/2013  . Anemia   . Aneurysm ([place])    on assending aorta, currently watching it, Dr Cyndia Bent  . Anxiety   . Arthritis   . Asthma   . Atrial fibrillation ([place])   . Barrett's esophagus   . CAD (coronary artery disease) 03/2006   3.0 x 20 mm TAXUS Perseus DES to the LAD; 01/2007  L main 30%, oLAD 50%, pLAD stent ok, CFX 80%, OM 60%, pRCA 60%, mRCA 70%, oPDA 90%; med rx   . Cancer ([place])    skin cancer   . Cataract    bilateral removal of cateracts  . Complication of anesthesia     no issues,but pt prefers spinal due to Pulmonary problems  . COPD (chronic obstructive pulmonary disease) ([place])    oxygen  on standby in home.  . Depression   . Diverticulosis   . Emphysema   . Enlarged prostate with urinary retention   . Esophageal stenosis   . GERD (gastroesophageal reflux disease)   . Hiatal hernia   . Hypothyroidism   . Neuropathy   . Osteoporosis   . Pituitary macroadenoma ([place])   . Restless legs syndrome (RLS)   . UGIB (upper gastrointestinal bleed) 03/2013   EGD w/ large ulcer at GE junction    Past Surgical History:  Procedure Laterality Date  . ABDOMINAL HERNIA REPAIR   2008  . CARDIAC CATHETERIZATION  01/2007   L main 30%, oLAD 50%,  pLAD stent ok, CFX 80%, OM 60%, pRCA 60%, mRCA 70%, oPDA 90%; med rx  . cataract extraction both eyes    . COLONOSCOPY    . CORONARY STENT PLACEMENT  03/2006   3.0 x 20 mm TAXUS Perseus DES to the LAD  . CYSTOSCOPY N/A 06/10/2015   Procedure: CYSTOSCOPY FULGRATION OF BLEEDING,  electovapor resection;  Surgeon: Irine Seal, MD;  Location: [facility];  Service: Urology;  Laterality: N/A;  . CYSTOSCOPY WITH INSERTION OF UROLIFT N/A 06/01/2015   Procedure: CYSTOSCOPY WITH INSERTION OF UROLIFT x4;  Surgeon: Irine Seal, MD;  Location: [facility];  Service: Urology;  Laterality: N/A;  . ESOPHAGOGASTRODUODENOSCOPY N/A 04/20/2013   Procedure: ESOPHAGOGASTRODUODENOSCOPY (EGD);  Surgeon: Inda Castle, MD;  Location: [facility];   Service: Endoscopy;  Laterality: N/A;  . ESOPHAGOGASTRODUODENOSCOPY N/A 03/25/2016   Procedure: ESOPHAGOGASTRODUODENOSCOPY (EGD);  Surgeon: Irene Shipper, MD;  Location: [facility];  Service: Endoscopy;  Laterality: N/A;  . ESOPHAGOGASTRODUODENOSCOPY (EGD) WITH PROPOFOL N/A 10/13/2015   Procedure: ESOPHAGOGASTRODUODENOSCOPY (EGD) WITH PROPOFOL;  Surgeon: Jerene Bears, MD;  [facility]: [facility];  Service: Gastroenterology;  Laterality: N/A;  . FOOT SURGERY  1994 left, 2002 right foot   bilateral foot reconstruciton  . FOOT SURGERY     reconstruction of both feet- no retained hardware.  Marland Kitchen HIATAL HERNIA REPAIR  01-04-2008  .  MELANOMA EXCISION  2019  . POLYPECTOMY    . PROSTATE SURGERY     x 2  . SAVORY DILATION N/A 03/25/2016   Procedure: SAVORY DILATION;  Surgeon: Irene Shipper, MD;  [facility]: [facility];  Service: Endoscopy;  Laterality: N/A;  . TOTAL HIP ARTHROPLASTY  07/21/2011   Procedure: TOTAL HIP ARTHROPLASTY ANTERIOR APPROACH;  Surgeon: Mauri Pole, MD;  Location: [facility];  Service: Orthopedics;  Laterality: Left;  . TOTAL HIP ARTHROPLASTY Right 09/07/2012   Procedure: RIGHT TOTAL HIP ARTHROPLASTY ANTERIOR APPROACH;  Surgeon: Mcarthur Rossetti, MD;  Location: [facility];  Service: Orthopedics;  Laterality: Right;  . TRANSURETHRAL RESECTION OF BLADDER NECK N/A 11/04/2015   Procedure: RESECTION OF BLADDER NECK;  Surgeon: Cleon Gustin, MD;  Location: [facility];  Service: Urology;  Laterality: N/A;  . TRANSURETHRAL RESECTION OF PROSTATE N/A 11/04/2015   Procedure: TRANSURETHRAL RESECTION OF THE PROSTATE (TURP); REMOVAL OF UROLIFT IMPLANTS X THREE;  Surgeon: Cleon Gustin, MD;  Location: [facility];  Service: Urology;  Laterality: N/A;  . UPPER GASTROINTESTINAL ENDOSCOPY  12/2013   Dr Hilarie Fredrickson, gastritis  . VIDEO BRONCHOSCOPY Bilateral 02/01/2017   Procedure: VIDEO BRONCHOSCOPY WITHOUT FLUORO;  Surgeon: Tanda Rockers, MD;  [facility];  Service: Cardiopulmonary;  Laterality:  Bilateral;    ROS: Positive for rib pain.Otherwise as stated in the HPI and negative for all other systems.  PHYSICAL EXAM BP 100/62   Pulse 75   Ht 5' 3.5" (1.613 m)   Wt 152 lb (68.9 kg)   BMI 26.50 kg/m   GENERAL:  Well appearing NECK:  No jugular venous distention, waveform within normal limits, carotid upstroke brisk and symmetric, no bruits, no thyromegaly LUNGS:  Clear to auscultation bilaterally HEART:  PMI not displaced or sustained,S1 and S2 within normal limits, no S3, no S4, no clicks, no rubs, no murmurs ABD:  Flat, positive bowel sounds normal in frequency in pitch, no bruits, no rebound, no guarding, no midline pulsatile mass, no hepatomegaly, no splenomegaly EXT:  2 plus pulses throughout, no edema, no cyanosis no clubbing  Lab Results  Component Value Date   CHOL 173 07/19/2017   TRIG 49 07/19/2017   HDL 51 07/19/2017   LDLCALC 112 (H) 07/19/2017   EKG:  Sinus rhythm, rate 75, axis within normal limits, intervals within normal limits, low voltage in the limb leads. no acute ST-T wave changes  10/06/2017 area  ASSESSMENT AND PLAN  CORONARY ARTERY DISEASE -   He has no new obstructive symptoms. Will continue to reduce risk with lifestyle and medications.    ATRIAL FIB -  He has not had any symptomatic recurrence of this.  He would not be an anticoagulation candidate with his previous GI bleeds. No change in therapy is planned.  HYPERLIPIDEMIA -   This is followed by Clois Dupes, MD.  Results are as above.  No change in therapy.   ASCENDING AORTIC ANEURYSM- This is 4.5 on CT earlier this year and is followed by Dr. Cyndia Bent.  This will be followed again q 6 months.    DISPOSITION: FU with me in 6 months.

## 2017-10-06 ENCOUNTER — Encounter: Payer: Self-pay | Admitting: Cardiology

## 2017-10-06 ENCOUNTER — Ambulatory Visit: Payer: Medicare Other | Admitting: Cardiology

## 2017-10-06 VITALS — BP 100/62 | HR 75 | Ht 63.5 in | Wt 152.0 lb

## 2017-10-06 DIAGNOSIS — I480 Paroxysmal atrial fibrillation: Secondary | ICD-10-CM | POA: Diagnosis not present

## 2017-10-06 DIAGNOSIS — I2510 Atherosclerotic heart disease of native coronary artery without angina pectoris: Secondary | ICD-10-CM

## 2017-10-06 NOTE — Patient Instructions (Signed)
Medication Instructions:  The current medical regimen is effective;  continue present plan and medications.  Follow-Up: Follow up in 6 months with Dr. Hochrein.  You will receive a letter in the mail 2 months before you are due.  Please call us when you receive this letter to schedule your follow up appointment.  If you need a refill on your cardiac medications before your next appointment, please call your pharmacy.  Thank you for choosing  [facility]!!       

## 2017-10-16 ENCOUNTER — Other Ambulatory Visit: Payer: Self-pay | Admitting: Family Medicine

## 2017-10-31 ENCOUNTER — Ambulatory Visit: Payer: Medicare Other | Admitting: Internal Medicine

## 2017-10-31 ENCOUNTER — Encounter: Payer: Self-pay | Admitting: Internal Medicine

## 2017-10-31 DIAGNOSIS — J449 Chronic obstructive pulmonary disease, unspecified: Secondary | ICD-10-CM | POA: Diagnosis not present

## 2017-10-31 NOTE — Patient Instructions (Signed)
No change in medications    Please schedule a follow up visit in 6  months but call sooner if needed  

## 2017-10-31 NOTE — Progress Notes (Signed)
Subjective:   Patient ID: [name], male    DOB: [birth year]   MRN: [id number]    Brief patient profile:  85   yowm quit smoking completely around 1980 (relatively light) with cough and short of breath eval by  Dr Eliberto Ivory  Then [name] dx of copd/ cb  pft's showed fev1 116% 02/2010 though ratio 66 c/w GOLD I criteria    History of Present Illness  07/05/2010 ov cc recurrent pna's since June 2011 cc not back to baseline in terms of activities he enjoyed in May, for example  Could do some yardwork  and rarely  Needed saba and no need for any maint rx,   but on  spriva for sob and still frequent tightness generalized front more than back,  Equal both sides  low grade fever and mucus gets thick and yellow> admit to [facility] 24-27 by [facility] dx of ? Pna.   No sob at rest.   Continue protonix 40 mg  Take 30-60 min before first meal of the day and Pepcid 20 mg at bedtime GERD (REFLUX) diet Try symbicort 160 Take 2 puffs bid and work on hfa technique    08/24/2010 ov/[name]/[name] cc breathing and cough better despite L rib fx 07/22/10. Main concern is how to prevent future exacerbations.  No limiting sob but not as active since quit smoking. Doing so much better overall rec  Ok to stop spiriva unless it reduces your desired activity performance (it's like high octane fuel for your lungs so your can be all you can be) Continue symbicort 160 Take 2 puffs first thing in am and then another 2 puffs about 12 hours later.   In event of recurrent signs of infection:  Change in sputum, fever, cough, short of breath or sore throat go ahead and take a course of doxycycline - if this doesn't help we need to see you in [place] ([name]/ [name]) F/u prn      01/31/2017  f/u ov/[name] re:  Copd 1/ chronic bronchitis / ? Prev aspiraton with abnormal cT chest  ? obst RLL  Chief Complaint  Patient presents with  . Follow-up    Pt states he has had "a touch of PNA". He has been coughing since the beginning on Nov  2018.  He has taken doxy and levaquin.  He is coughing up greyish white sputum.  He has an albuterol inhaler that he rarely uses.   swallowing better now but still very congested cough esp since early nov 2018  Not using flutter valve as rec  hfa has improved but not yet adequate Still some hb/ sore throat symptoms despite reporting ppi bid  Also still some noct cough sev hours p lie down despite using 6 inch blocks rec FOB 02/01/17> tenacious secretions> nl flora/ no tb, no fungus on culture    04/28/2017  f/u ov/[name] re: f/u COPD / cb abn ct again Chief Complaint  Patient presents with  . Acute Visit    Increased cough x 10 days- prod with white to grey sputum. He started on Doxy per Dr Laurance Flatten- has one dose remaining. He states his breathing is unchanged since his last visit. He rarely uses his albuterol inhaler.   Had CT Chest done 04/26/17.    did better p first few days p fob until the end Dec nasty mucus, more sob > doxy then added zpak then better again then worse again last week if Feb 2019 restarted doxy and now  on day #  9/10 on  ct chest 04/26/17 obst again on w/u for TAA Doe = MMRC1 = can walk nl pace, flat grade, can't hurry or go uphills or steps s sob   Sleeps ok but quit a bit of am congestion /mucus still grey   rec Please see patient coordinator before you leave today  to schedule sinus CT> neg  For nasty mucus >  zpak  Prednisone 10 mg take  4 each am x 2 days,   2 each am x 2 days,  1 each am x 2 days and stop  Work on inhaler technique:        07/31/2017  f/u ov/[name] re:  [name] /AB  Chief Complaint  Patient presents with  . Follow-up    Breathing is unchanged since last OV. Reports DOE, chest tightness, wheezing and coughing.  Dyspnea:  MMRC2 = can't walk a nl pace on a flat grade s sob but does fine slow and flat eg  Cough: worse at bedtime and improved p levaquin 250 x 10 days completed 07/17/17   Sleep: cough does wake him up  SABA use:  Not using  rec Stop  singulair Work on inhaler technique:  relax and gently blow all the way out then take a nice smooth deep breath back in, triggering the inhaler at same time you start breathing in.  Hold for up to 5 seconds if you can. Blow out thru nose. Rinse and gargle with water when done For cough > mucinex or mucinex dm up to 1200 mg every 12 hours as needed the flutter valve as much as possible For nasty mucus >  levaquin 500 mg daily x 7 days ok to refill      10/31/2017  f/u ov/Philicia Heyne re:  Copd gold I/ ab   Chief Complaint  Patient presents with  . Follow-up    Breathing is doing well and no new co's. He rarely uses his albuterol.    Dyspnea:  Not limited by breathing from desired activities  But sedentary  Cough: am thick clear x tbsp but not purulent or bloody  Sleeping: up 6 inch  SABA use: rare 02: no    No obvious day to day or daytime variability or assoc definite   mucus plugs or hemoptysis or cp or chest tightness, subjective wheeze or overt sinus or hb symptoms.   Sleeping as above  without nocturnal   exacerbation  of respiratory  c/o's or need for noct saba. Also denies any obvious fluctuation of symptoms with weather or environmental changes or other aggravating or alleviating factors except as outlined above   No unusual exposure hx or h/o childhood pna/ asthma or knowledge of premature birth.  Current Allergies, Complete Past Medical History, Past Surgical History, Family History, and Social History were reviewed in [employer] record.  ROS  The following are not active complaints unless bolded Hoarseness, sore throat, dysphagia, dental problems, itching, sneezing,  nasal congestion or discharge of excess mucus or purulent secretions, ear ache,   fever, chills, sweats, unintended wt loss or wt gain, classically pleuritic or exertional cp,  orthopnea pnd or arm/hand swelling  or leg swelling, presyncope, palpitations, abdominal pain, anorexia, nausea, vomiting,  diarrhea  or change in bowel habits or change in bladder habits, change in stools or change in urine, dysuria, hematuria,  rash, arthralgias, visual complaints, headache, numbness, weakness or ataxia or problems with walking or coordination,  change in  mood or  memory.        Current Meds  Medication Sig  . albuterol (PROVENTIL HFA;VENTOLIN HFA) 108 (90 Base) MCG/ACT inhaler Inhale 2 puffs into the lungs every 6 (six) hours as needed for wheezing or shortness of breath.  . bisacodyl (DULCOLAX) 10 MG suppository Place 10 mg rectally as needed for moderate constipation.  . bromocriptine (PARLODEL) 2.5 MG tablet Take 2.5 mg by mouth 2 (two) times daily.  . calcium carbonate (OSCAL) 1500 (600 Ca) MG TABS tablet Take 600 mg of elemental calcium by mouth daily with breakfast.   . cholecalciferol (VITAMIN D) 1000 UNITS tablet Take 1,000 Units by mouth daily.   . diclofenac (VOLTAREN) 75 MG EC tablet Take 1 tablet (75 mg total) by mouth 2 (two) times daily.  Marland Kitchen diltiazem (CARDIZEM CD) 120 MG 24 hr capsule TAKE (1) CAPSULE DAILY  . ferrous sulfate 325 (65 FE) MG tablet Take 325 mg by mouth daily with breakfast.   . guaiFENesin (MUCINEX) 600 MG 12 hr tablet Take 600 mg by mouth daily.   Marland Kitchen levothyroxine (SYNTHROID, LEVOTHROID) 50 MCG tablet TAKE 1 TABLET IN MORNING  . magnesium oxide (MAG-OX) 400 (241.3 Mg) MG tablet Take 400 mg by mouth every other day.  . naphazoline-pheniramine (NAPHCON-A) 0.025-0.3 % ophthalmic solution Place 1 drop into both eyes 2 (two) times daily as needed for irritation or allergies.   . nitroGLYCERIN (NITROSTAT) 0.4 MG SL tablet Place 1 tablet (0.4 mg total) under the tongue every 5 (five) minutes as needed for chest pain.  . pantoprazole (PROTONIX) 40 MG tablet TAKE (1) TABLET TWICE A DAY BEFORE MEALS.  Marland Kitchen polyethylene glycol (MIRALAX / GLYCOLAX) packet Take 17 g by mouth daily. (Patient taking differently: Take 17 g by mouth daily as needed for moderate constipation. )  .  Respiratory Therapy Supplies (FLUTTER) DEVI 1 Device by Does not apply route as needed.  . sucralfate (CARAFATE) 1 g tablet TAKE (1) TABLET FOUR TIMES DAILY AS NEEDED FOR GI UPSET  . vitamin B-12 (CYANOCOBALAMIN) 1000 MCG tablet Take 1,000 mcg by mouth daily.  . vitamin E (VITAMIN E) 400 UNIT capsule Take 400 Units by mouth daily.              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 Bonchiectasis RLL- CT 2009 > not verified on CT 07/22/10     Past Surgical History:  Inguinal herniorrhaphy  PTCA/stent  LEFT & RIGHT FOOT RECONSTRUCTION  hiatal hernia repair 2009   Family History:  neg for pituitary dz  mother died age 49 from phlebitis  father died age 20 from heart failure  8 siblings  alive age 44,80,89,92  1 died age 60 heart failure  3 died age 23,76,90 from heart problems    Social History:  married  2 children  retiired  never smoked  no etoh         Objective:   Physical Exam   Thin amb wm nad   wt 167 07/05/2010  > 168 08/24/2010 > 04/05/2011 174> 07/12/2011  176> 09/24/2012  169 > 02/25/2013  175 >  01/31/2017   150 > 04/28/2017   156 > 07/31/2017    155  > 10/31/2017  149    06/13/16 153 lb 12.8 oz (69.8 kg)  05/10/16 155 lb (70.3 kg)  05/06/16 154 lb (69.9 kg)      Vital signs reviewed - Note on arrival 02 sats  96% on RA      HEENT: nl dentition, turbinates bilaterally, and oropharynx. Nl external ear canals without cough reflex   NECK :  without JVD/Nodes/TM/ nl carotid upstrokes  bilaterally   LUNGS: no acc muscle use,  Nl contour chest with minimal mid exp rhonchi  bilaterally without cough on insp or exp maneuvers   CV:  RRR  no s3 or murmur or increase in P2, and no edema   ABD:  soft and nontender with nl inspiratory excursion in the supine position. No bruits or organomegaly appreciated, bowel sounds nl  MS:  Nl gait/ ext warm without deformities, calf tenderness, cyanosis or clubbing No obvious joint restrictions   SKIN: warm and dry without lesions    NEURO:  alert, approp, nl sensorium with  no motor or cerebellar deficits apparent.       I personally reviewed images and agree with radiology impression as follows:  CXR:   09/25/17 Single-view chest demonstrates elevation of left diaphragm. No acute airspace disease or effusion. Stable cardiomediastinal silhouette. Aortic atherosclerosis. No no pneumothorax.       Assessment & Plan:

## 2017-11-01 ENCOUNTER — Encounter: Payer: Self-pay | Admitting: Internal Medicine

## 2017-11-01 NOTE — Assessment & Plan Note (Signed)
-   Spirometry 03/30/2010 FEV1  2.86 (116%) ratio 66  - FOB 02/01/2017  Copious retained secretions - Flutter valve re-training 04/28/2017  - PFT's  07/31/2017  FEV1 2.44 (102 % ) ratio 64  p 12 % improvement from saba p symbicort 160  prior to study with DLCO unable to perform  - 07/31/2017  After extensive coaching inhaler device  effectiveness =    75% from a baseline of 50% - 07/31/2017 d/c singulair and add levquin 500 mg daily x 7 day courses prn purulent sputum   Has not had to take levaquin, relatively well compensated on on prn saba/ flutter valve/ prn levaquin   No change in rx needed   Each maintenance medication was reviewed in detail including most importantly the difference between maintenance and as needed and under what circumstances the prns are to be used.  Please see AVS for specific  Instructions which are unique to this visit and I personally typed out  which were reviewed in detail in writing with the patient and a copy provided.    F/u q 6 months, sooner prn

## 2017-11-06 ENCOUNTER — Encounter: Payer: Self-pay | Admitting: Primary Care

## 2017-11-06 ENCOUNTER — Telehealth: Payer: Self-pay | Admitting: Internal Medicine

## 2017-11-06 ENCOUNTER — Ambulatory Visit: Payer: Medicare Other | Admitting: Primary Care

## 2017-11-06 ENCOUNTER — Ambulatory Visit (INDEPENDENT_AMBULATORY_CARE_PROVIDER_SITE_OTHER)
Admission: RE | Admit: 2017-11-06 | Discharge: 2017-11-06 | Disposition: A | Discharge status: Home / Self Care | Payer: Medicare Other | Source: Ambulatory Visit | Attending: Primary Care | Admitting: Primary Care

## 2017-11-06 VITALS — BP 116/58 | HR 90 | Ht 63.0 in | Wt 152.6 lb

## 2017-11-06 DIAGNOSIS — R059 Cough, unspecified: Secondary | ICD-10-CM

## 2017-11-06 DIAGNOSIS — R05 Cough: Secondary | ICD-10-CM | POA: Diagnosis not present

## 2017-11-06 DIAGNOSIS — J449 Chronic obstructive pulmonary disease, unspecified: Secondary | ICD-10-CM | POA: Diagnosis not present

## 2017-11-06 DIAGNOSIS — J441 Chronic obstructive pulmonary disease with (acute) exacerbation: Secondary | ICD-10-CM | POA: Diagnosis not present

## 2017-11-06 MED ORDER — LEVALBUTEROL HCL 0.63 MG/3ML IN NEBU: 0.6300 mg | INHALATION_SOLUTION | Freq: Four times a day (QID) | RESPIRATORY_TRACT | 1 refills | Status: DC | PRN

## 2017-11-06 MED ORDER — LEVALBUTEROL HCL 0.63 MG/3ML IN NEBU
0.6300 mg | INHALATION_SOLUTION | RESPIRATORY_TRACT | Status: AC
  Administered 2017-11-06: 0.63 mg via RESPIRATORY_TRACT

## 2017-11-06 MED ORDER — LEVOFLOXACIN 500 MG PO TABS: 500.0000 mg | ORAL_TABLET | Freq: Every day | ORAL | 0 refills | Status: DC

## 2017-11-06 NOTE — Patient Instructions (Addendum)
CXR showed atelectasis, possible left lower lobe infiltrate/consolidation Treating for suspected early PNA with Levaquin   Cough: Take Mucinex twice a day Delsym cough syrup  Continue flutter valve   Breathing:  Use nebulizer 2-3 times a day Rescue inhaler back up  Tylenol for fever Encourage stay well hydrated with oral fluids  FU in 1 week with Eustaquio Maize NP or Dr. Melvyn Novas

## 2017-11-06 NOTE — Telephone Encounter (Signed)
Spoke with pt, he is having a lot of congestion, fever. And chills. He just saw Dr. Melvyn Novas last Tuesday. He is coughing up grayish/brown mucus. He does have a RX MW provided to use if he has a flare. He started the abx but wanted to come in for an office visit. I made an appt for him to see beth today at 2:30pm. Nothing further is needed.

## 2017-11-06 NOTE — Progress Notes (Signed)
@Patient  ID: [name], male    DOB: 1932-03-08, 84 y.o.   MRN: [id number]  Chief Complaint  Patient presents with  . Acute Visit    cough gray/brown mucus, SOB, chest tightness and wheezing    Referring provider: Chipper Herb, MD  HPI: 84 year old male, never smoked. PMH COPD GOLD I, chronic bronchitis. Patient of Dr. Melvyn Novas, last seen on 10/31/17 and was doing well during that visit. Persistent cough with thick sputum, some rhonchi to bases. CXR in July 2019 demonstrated elevation of left diaphragm.   PFT's 02/2010- showed FEV1 116%,  ratio 66 c/w GOLD I criteria   11/07/2017 Presents today for acute visit. Complains of cough and sob x4 days. Cough is productive with grey/brown thick mucus. Associated low grade temp. He had doxycyline at home and started taking 1 tab daily. Taking mucinex once daily, using flutter valve three times a day. Has not required rescue inhaler. Afebrile today. Denies hemoptysis.    Allergies  Allergen Reactions  . Betadine [Povidone Iodine] Other (See Comments)    Reaction:  Blisters   . Lortab [Hydrocodone-Acetaminophen] Nausea And Vomiting  . Penicillins Other (See Comments)    Reaction:  Lightheadedness  Has patient had a PCN reaction causing immediate rash, facial/tongue/throat swelling, SOB or lightheadedness with hypotension: Yes Has patient had a PCN reaction causing severe rash involving mucus membranes or skin necrosis: No Has patient had a PCN reaction that required hospitalization No Has patient had a PCN reaction occurring within the last 10 years: No If all of the above answers are "NO", then may proceed with Cephalosporin use.  Marland Kitchen Zetia [Ezetimibe] Other (See Comments)    Reaction:  Muscle weakness   . Zocor [Simvastatin] Nausea Only and Other (See Comments)    Reaction:  Muscle weakness     Immunization History  Administered Date(s) Administered  . Influenza Whole 11/22/2007, 10/29/2009, 10/30/2010, 11/29/2011  . Influenza,  High Dose Seasonal PF 12/03/2015, 12/26/2016  . Influenza,inj,Quad PF,6+ Mos 12/03/2012, 11/25/2013, 12/19/2014  . Pneumococcal Conjugate-13 01/09/2013  . Pneumococcal Polysaccharide-23 10/29/2009  . Td 04/29/1998  . Zoster 03/31/2006    Past Medical History:  Diagnosis Date  . Abnormality of gait 06/07/2013  . Anemia   . Aneurysm ([place])    on assending aorta, currently watching it, Dr Cyndia Bent  . Anxiety   . Arthritis   . Asthma   . Atrial fibrillation ([place])   . Barrett's esophagus   . CAD (coronary artery disease) 03/2006   3.0 x 20 mm TAXUS [place] DES to the LAD; 01/2007  L main 30%, oLAD 50%, pLAD stent ok, CFX 80%, OM 60%, pRCA 60%, mRCA 70%, oPDA 90%; med rx   . Cancer ([place])    skin cancer   . Cataract    bilateral removal of cateracts  . Complication of anesthesia     no issues,but pt prefers spinal due to Pulmonary problems  . COPD (chronic obstructive pulmonary disease) ([place])    oxygen  on standby in home.  . Depression   . Diverticulosis   . Emphysema   . Enlarged prostate with urinary retention   . Esophageal stenosis   . GERD (gastroesophageal reflux disease)   . Hiatal hernia   . Hypothyroidism   . Neuropathy   . Osteoporosis   . Pituitary macroadenoma ([place])   . Restless legs syndrome (RLS)   . UGIB (upper gastrointestinal bleed) 03/2013   EGD w/ large ulcer at GE junction  Tobacco History: Social History   Tobacco Use  Smoking Status Never Smoker  Smokeless Tobacco Never Used   Counseling given: Not Answered   Outpatient Medications Prior to Visit  Medication Sig Dispense Refill  . albuterol (PROVENTIL HFA;VENTOLIN HFA) 108 (90 Base) MCG/ACT inhaler Inhale 2 puffs into the lungs every 6 (six) hours as needed for wheezing or shortness of breath. 18 g 11  . bisacodyl (DULCOLAX) 10 MG suppository Place 10 mg rectally as needed for moderate constipation.    . bromocriptine (PARLODEL) 2.5 MG tablet Take 2.5 mg by mouth 2 (two) times daily.    .  calcium carbonate (OSCAL) 1500 (600 Ca) MG TABS tablet Take 600 mg of elemental calcium by mouth daily with breakfast.     . cholecalciferol (VITAMIN D) 1000 UNITS tablet Take 1,000 Units by mouth daily.     . diclofenac (VOLTAREN) 75 MG EC tablet Take 1 tablet (75 mg total) by mouth 2 (two) times daily. 30 tablet 0  . diltiazem (CARDIZEM CD) 120 MG 24 hr capsule TAKE (1) CAPSULE DAILY 90 capsule 0  . ferrous sulfate 325 (65 FE) MG tablet Take 325 mg by mouth daily with breakfast.     . guaiFENesin (MUCINEX) 600 MG 12 hr tablet Take 600 mg by mouth daily.     Marland Kitchen levothyroxine (SYNTHROID, LEVOTHROID) 50 MCG tablet TAKE 1 TABLET IN MORNING 30 tablet 0  . magnesium oxide (MAG-OX) 400 (241.3 Mg) MG tablet Take 400 mg by mouth every other day.    . naphazoline-pheniramine (NAPHCON-A) 0.025-0.3 % ophthalmic solution Place 1 drop into both eyes 2 (two) times daily as needed for irritation or allergies.     . nitroGLYCERIN (NITROSTAT) 0.4 MG SL tablet Place 1 tablet (0.4 mg total) under the tongue every 5 (five) minutes as needed for chest pain. 25 tablet 2  . pantoprazole (PROTONIX) 40 MG tablet TAKE (1) TABLET TWICE A DAY BEFORE MEALS. 180 tablet 1  . polyethylene glycol (MIRALAX / GLYCOLAX) packet Take 17 g by mouth daily. (Patient taking differently: Take 17 g by mouth daily as needed for moderate constipation. ) 30 each 3  . Respiratory Therapy Supplies (FLUTTER) DEVI 1 Device by Does not apply route as needed. 1 each 0  . sucralfate (CARAFATE) 1 g tablet TAKE (1) TABLET FOUR TIMES DAILY AS NEEDED FOR GI UPSET 120 tablet 0  . vitamin B-12 (CYANOCOBALAMIN) 1000 MCG tablet Take 1,000 mcg by mouth daily.    . vitamin E (VITAMIN E) 400 UNIT capsule Take 400 Units by mouth daily.     No facility-administered medications prior to visit.     Review of Systems  Review of Systems  Constitutional:       Low grade temp  HENT: Positive for congestion and sinus pressure.   Respiratory: Positive for cough  and shortness of breath. Negative for wheezing.   Cardiovascular: Negative.     Physical Exam  BP (!) 116/58 (BP Location: Left Arm, Cuff Size: Normal)   Pulse 90   Ht 5\' 3"  (1.6 m)   Wt 152 lb 9.6 oz (69.2 kg)   SpO2 93%   BMI 27.03 kg/m  Physical Exam  Constitutional: He is oriented to person, place, and time. He appears well-developed and well-nourished. No distress.  Elderly gentleman   HENT:  Head: Normocephalic and atraumatic.  Eyes: Pupils are equal, round, and reactive to light. EOM are normal.  Neck: Normal range of motion. Neck supple.  Cardiovascular: Normal rate and regular  rhythm.  Pulmonary/Chest: Effort normal. No stridor. No respiratory distress. He has wheezes.  Scattered rhonchi   Musculoskeletal: Normal range of motion.  Neurological: He is alert and oriented to person, place, and time.  Skin: Skin is dry.  Psychiatric: He has a normal mood and affect. His behavior is normal. Judgment and thought content normal.     Lab Results:  CBC    Component Value Date/Time   WBC 13.4 (H) 09/20/2017 1437   WBC 7.8 07/31/2017 1131   RBC 3.82 (L) 09/20/2017 1437   RBC 3.80 (L) 07/31/2017 1131   HGB 11.5 (L) 09/20/2017 1437   HCT 36.9 (L) 09/20/2017 1437   PLT 285 09/20/2017 1437   MCV 97 09/20/2017 1437   MCH 30.1 09/20/2017 1437   MCH 31.3 10/23/2015 1000   MCHC 31.2 (L) 09/20/2017 1437   MCHC 34.4 07/31/2017 1131   RDW 15.3 09/20/2017 1437   LYMPHSABS 1.7 09/20/2017 1437   MONOABS 1.0 07/31/2017 1131   EOSABS 0.3 09/20/2017 1437   BASOSABS 0.0 09/20/2017 1437    BMET    Component Value Date/Time   NA 141 09/20/2017 1437   K 4.2 09/20/2017 1437   CL 106 09/20/2017 1437   CO2 24 09/20/2017 1437   GLUCOSE 128 (H) 09/20/2017 1437   GLUCOSE 100 (H) 05/05/2017 1152   BUN 17 09/20/2017 1437   CREATININE 0.90 09/20/2017 1725   CALCIUM 8.3 (L) 09/20/2017 1437   GFRNONAA 79 09/20/2017 1437   GFRAA 91 09/20/2017 1437    BNP    Component Value  Date/Time   BNP 224.9 (H) 06/12/2015 0033    ProBNP    Component Value Date/Time   PROBNP 261.9 04/18/2013 1421    Imaging: Dg Chest 2 View  Result Date: 11/06/2017 CLINICAL DATA:  Cough, low-grade fever EXAM: CHEST - 2 VIEW COMPARISON:  07/07/2017 FINDINGS: Bibasilar atelectasis or scarring. Bilateral upper lobe scarring. Heart is normal size. No effusions. No acute bony abnormality. Old healed left rib fractures. IMPRESSION: Bibasilar atelectasis or scarring, upper lobe scarring. No acute disease. Electronically Signed   By: Rolm Baptise M.D.   On: 11/06/2017 15:16     Assessment & Plan:   COPD with acute exacerbation ([place]) - Current exacerbation with purulent sputum and low grade temp - CXR obtained today- showed bibasilar atelectasis or scarring, upper lobe scarring. Images independently reviewed, increased density to left lower lobe. Possible early infiltrate/consolidation - Received Xopenex neb x1 in office, patient states improvement in ease of breathing - Started on Levaquin 500mg  x 7 days  - Prednisone 20mg  daily x 5 days (patient has prednisone tabs at home) - Mucinex twice a day, continue flutter valve and incentive spirometry  - Use nebulizer q8 hours prn sob/wheeze  - FU in 1 week or if symptoms worsen     Martyn Ehrich, NP 11/07/2017

## 2017-11-07 ENCOUNTER — Encounter: Payer: Self-pay | Admitting: Primary Care

## 2017-11-07 DIAGNOSIS — J441 Chronic obstructive pulmonary disease with (acute) exacerbation: Secondary | ICD-10-CM | POA: Insufficient documentation

## 2017-11-07 NOTE — Assessment & Plan Note (Deleted)
-   CXR obtained today- showed bibasilar atelectasis or scarring, upper lobe scarring. Images independently reviewed, increased density to left lower lobe. Possible early infiltrate/consolidation - Treating for suspected PNA with Levaquin x1 week - Mucinex twice a day, continue flutter valve and incentive spirometry

## 2017-11-07 NOTE — Assessment & Plan Note (Deleted)
-   Current exacerbation with purulent sputum and low grade temp - Started on Levaquin 500mg  x 7 days

## 2017-11-07 NOTE — Progress Notes (Signed)
Chart and office note reviewed in detail  > agree with a/p as outlined    

## 2017-11-07 NOTE — Telephone Encounter (Signed)
Patient is calling this morning in regards to email he sent last night.  He states this morning he found his nebulizer machine, so please disregard this email that he sent yesterday.  No call back is needed.  If any questions, CB is [phone]

## 2017-11-07 NOTE — Assessment & Plan Note (Addendum)
-   Current exacerbation with purulent sputum and low grade temp - CXR obtained today- showed bibasilar atelectasis or scarring, upper lobe scarring. Images independently reviewed, increased density to left lower lobe. Possible early infiltrate/consolidation - Received Xopenex neb x1 in office, patient states improvement in ease of breathing - Started on Levaquin 500mg  x 7 days  - Prednisone 20mg  daily x 5 days (patient has prednisone tabs at home) - Mucinex twice a day, continue flutter valve and incentive spirometry  - Use nebulizer q8 hours prn sob/wheeze  - FU in 1 week or if symptoms worsen

## 2017-11-07 NOTE — Assessment & Plan Note (Deleted)
-   CXR obtained today- showed bibasilar atelectasis or scarring, upper lobe scarring. Images independently reviewed, increased density to left lower lobe. Possible early infiltrate/consolidation - Treating with Levaquin x1 week - Mucinex twice a day, continue flutter valve and incentive spirometry

## 2017-11-13 ENCOUNTER — Ambulatory Visit: Payer: Medicare Other | Admitting: Internal Medicine

## 2017-11-13 ENCOUNTER — Encounter: Payer: Self-pay | Admitting: Internal Medicine

## 2017-11-13 ENCOUNTER — Ambulatory Visit (INDEPENDENT_AMBULATORY_CARE_PROVIDER_SITE_OTHER)
Admission: RE | Admit: 2017-11-13 | Discharge: 2017-11-13 | Disposition: A | Discharge status: Home / Self Care | Payer: Medicare Other | Source: Ambulatory Visit | Attending: Internal Medicine | Admitting: Internal Medicine

## 2017-11-13 VITALS — BP 108/62 | HR 79 | Ht 63.0 in | Wt 154.6 lb

## 2017-11-13 DIAGNOSIS — R05 Cough: Secondary | ICD-10-CM | POA: Diagnosis not present

## 2017-11-13 DIAGNOSIS — R9389 Abnormal findings on diagnostic imaging of other specified body structures: Secondary | ICD-10-CM

## 2017-11-13 DIAGNOSIS — J449 Chronic obstructive pulmonary disease, unspecified: Secondary | ICD-10-CM

## 2017-11-13 MED ORDER — LEVOFLOXACIN 500 MG PO TABS: 500.0000 mg | ORAL_TABLET | Freq: Every day | ORAL | 0 refills | Status: DC

## 2017-11-13 MED ORDER — BUDESONIDE-FORMOTEROL FUMARATE 80-4.5 MCG/ACT IN AERO: 2.0000 | INHALATION_SPRAY | Freq: Two times a day (BID) | RESPIRATORY_TRACT | 11 refills | Status: DC

## 2017-11-13 MED ORDER — PREDNISONE 10 MG PO TABS: ORAL_TABLET | ORAL | 0 refills | Status: DC

## 2017-11-13 MED ORDER — ALBUTEROL SULFATE HFA 108 (90 BASE) MCG/ACT IN AERS: 2.0000 | INHALATION_SPRAY | Freq: Four times a day (QID) | RESPIRATORY_TRACT | 2 refills | Status: DC | PRN

## 2017-11-13 MED ORDER — BUDESONIDE-FORMOTEROL FUMARATE 80-4.5 MCG/ACT IN AERO: 2.0000 | INHALATION_SPRAY | Freq: Two times a day (BID) | RESPIRATORY_TRACT | 0 refills | Status: DC

## 2017-11-13 NOTE — Progress Notes (Signed)
Subjective:   Patient ID: [name], male    DOB: [birth year]   MRN: [id number]    Brief patient profile:  85   yowm quit smoking completely around 1980 (relatively light) with cough and short of breath eval by  Dr [name]  Then [name] dx of copd/ cb  pft's showed fev1 116% 02/2010 though ratio 66 c/w GOLD I criteria    History of Present Illness  07/05/2010 ov cc recurrent pna's since June 2011 cc not back to baseline in terms of activities he enjoyed in May, for example  Could do some yardwork  and rarely  Needed saba and no need for any maint rx,   but on  spriva for sob and still frequent tightness generalized front more than back,  Equal both sides  low grade fever and mucus gets thick and yellow> admit to [facility] April 24-27 by [facility] dx of ? Pna.   No sob at rest.   Continue protonix 40 mg  Take 30-60 min before first meal of the day and Pepcid 20 mg at bedtime GERD (REFLUX) diet Try symbicort 160 Take 2 puffs bid and work on hfa technique    08/24/2010 ov/[name]/[name] cc breathing and cough better despite L rib fx 07/22/10. Main concern is how to prevent future exacerbations.  No limiting sob but not as active since quit smoking. Doing so much better overall rec  Ok to stop spiriva unless it reduces your desired activity performance (it's like high octane fuel for your lungs so your can be all you can be) Continue symbicort 160 Take 2 puffs first thing in am and then another 2 puffs about 12 hours later.   In event of recurrent signs of infection:  Change in sputum, fever, cough, short of breath or sore throat go ahead and take a course of doxycycline - if this doesn't help we need to see you in [place] ([name]/ [name]) F/u prn      01/31/2017  f/u ov/[name] re:  Copd 1/ chronic bronchitis / ? Prev aspiraton with abnormal cT chest  ? obst RLL  Chief Complaint  Patient presents with  . Follow-up    Pt states he has had "a touch of PNA". He has been coughing since the beginning on Nov  2018.  He has taken doxy and levaquin.  He is coughing up greyish white sputum.  He has an albuterol inhaler that he rarely uses.   swallowing better now but still very congested cough esp since early nov 2018  Not using flutter valve as rec  hfa has improved but not yet adequate Still some hb/ sore throat symptoms despite reporting ppi bid  Also still some noct cough sev hours p lie down despite using 6 inch blocks rec FOB 02/01/17> tenacious secretions> nl flora/ no tb, no fungus on culture    04/28/2017  f/u ov/[name] re: f/u COPD / cb abn ct again Chief Complaint  Patient presents with  . Acute Visit    Increased cough x 10 days- prod with white to grey sputum. He started on Doxy per Dr [name]- has one dose remaining. He states his breathing is unchanged since his last visit. He rarely uses his albuterol inhaler.   Had CT Chest done 04/26/17.    did better p first few days p fob until the end Dec nasty mucus, more sob > doxy then added zpak then better again then worse again last week if Feb 2019 restarted doxy and now  on day #  9/10 on  ct chest 04/26/17 obst again on w/u for TAA Doe = MMRC1 = can walk nl pace, flat grade, can't hurry or go uphills or steps s sob   Sleeps ok but quit a bit of am congestion /mucus still grey   rec Please see patient coordinator before you leave today  to schedule sinus CT> neg  For nasty mucus >  zpak  Prednisone 10 mg take  4 each am x 2 days,   2 each am x 2 days,  1 each am x 2 days and stop  Work on inhaler technique:        07/31/2017  f/u ov/[name] re:  [name] /AB  Chief Complaint  Patient presents with  . Follow-up    Breathing is unchanged since last OV. Reports DOE, chest tightness, wheezing and coughing.  Dyspnea:  MMRC2 = can't walk a nl pace on a flat grade s sob but does fine slow and flat eg  Cough: worse at bedtime and improved p levaquin 250 x 10 days completed 07/17/17   Sleep: cough does wake him up  SABA use:  Not using  rec Stop  singulair Work on inhaler technique:  For cough > mucinex or mucinex dm up to 1200 mg every 12 hours as needed the flutter valve as much as possible For nasty mucus >  levaquin 500 mg daily x 7 days ok to refill      10/31/2017  f/u ov/[name] re:  Copd gold I/ ab   Chief Complaint  Patient presents with  . Follow-up    Breathing is doing well and no new co's. He rarely uses his albuterol.    Dyspnea:  Not limited by breathing from desired activities  But sedentary  Cough: am thick clear x tbsp but not purulent or bloody  Sleeping: up 6 inch  SABA use: rare 02: no  rec No change    11/01/17 acute onset cough/ dark thick mucus > doxy > no better by the 11/06/17 so changed to levaquin and added prednisone      11/13/2017  f/u ov/[name] re: copd gold I/ AB Chief Complaint  Patient presents with  . Follow-up    Breathing has improved. He still has occ cough and wheezing. He is coughing up some gray to brown sputum. He has not been using his albuterol inhaler, but has been using xopenex neb 2 x daily.    Dyspnea:  Back near baseline Cough: still thick and gray in am Sleeping: 6 in elevated  hob / one pillow SABA use: xopenex neb bid instead of albuterol hfa prn as rec previously/ confused with instructions 02: none    No obvious day to day or daytime variability or assoc  mucus plugs or hemoptysis or cp or chest tightness, subjective wheeze or overt sinus or hb symptoms.   Sleeps as abov e without nocturnal    exacerbation  of respiratory  c/o's or need for noct saba. Also denies any obvious fluctuation of symptoms with weather or environmental changes or other aggravating or alleviating factors except as outlined above   No unusual exposure hx or h/o childhood pna/ asthma or knowledge of premature birth.  Current Allergies, Complete Past Medical History, Past Surgical History, Family History, and Social History were reviewed in [employer] record.  ROS  The  following are not active complaints unless bolded Hoarseness, sore throat, dysphagia, dental problems, itching, sneezing,  nasal  congestion or discharge of excess mucus or purulent secretions, ear ache,   fever, chills, sweats, unintended wt loss or wt gain, classically pleuritic or exertional cp,  orthopnea pnd or arm/hand swelling  or leg swelling, presyncope, palpitations, abdominal pain, anorexia, nausea, vomiting, diarrhea  or change in bowel habits or change in bladder habits, change in stools or change in urine, dysuria, hematuria,  rash, arthralgias, visual complaints, headache, numbness, weakness or ataxia or problems with walking or coordination,  change in mood or  memory.        Current Meds  Medication Sig  . albuterol (PROVENTIL HFA;VENTOLIN HFA) 108 (90 Base) MCG/ACT inhaler Inhale 2 puffs into the lungs every 6 (six) hours as needed for wheezing or shortness of breath.  . bisacodyl (DULCOLAX) 10 MG suppository Place 10 mg rectally as needed for moderate constipation.  . bromocriptine (PARLODEL) 2.5 MG tablet Take 2.5 mg by mouth 2 (two) times daily.  . calcium carbonate (OSCAL) 1500 (600 Ca) MG TABS tablet Take 600 mg of elemental calcium by mouth daily with breakfast.   . cholecalciferol (VITAMIN D) 1000 UNITS tablet Take 1,000 Units by mouth daily.   . diclofenac (VOLTAREN) 75 MG EC tablet Take 1 tablet (75 mg total) by mouth 2 (two) times daily.  Marland Kitchen diltiazem (CARDIZEM CD) 120 MG 24 hr capsule TAKE (1) CAPSULE DAILY  . ferrous sulfate 325 (65 FE) MG tablet Take 325 mg by mouth daily with breakfast.   . guaiFENesin (MUCINEX) 600 MG 12 hr tablet Take 600 mg by mouth daily.   Marland Kitchen levalbuterol (XOPENEX) 0.63 MG/3ML nebulizer solution Take 3 mLs (0.63 mg total) by nebulization every 6 (six) hours as needed for wheezing or shortness of breath.  . levothyroxine (SYNTHROID, LEVOTHROID) 50 MCG tablet TAKE 1 TABLET IN MORNING  . magnesium oxide (MAG-OX) 400 (241.3 Mg) MG tablet Take 400 mg by  mouth every other day.  . naphazoline-pheniramine (NAPHCON-A) 0.025-0.3 % ophthalmic solution Place 1 drop into both eyes 2 (two) times daily as needed for irritation or allergies.   . nitroGLYCERIN (NITROSTAT) 0.4 MG SL tablet Place 1 tablet (0.4 mg total) under the tongue every 5 (five) minutes as needed for chest pain.  . pantoprazole (PROTONIX) 40 MG tablet TAKE (1) TABLET TWICE A DAY BEFORE MEALS.  Marland Kitchen polyethylene glycol (MIRALAX / GLYCOLAX) packet Take 17 g by mouth daily. (Patient taking differently: Take 17 g by mouth daily as needed for moderate constipation. )  . Respiratory Therapy Supplies (FLUTTER) DEVI 1 Device by Does not apply route as needed.  . sucralfate (CARAFATE) 1 g tablet TAKE (1) TABLET FOUR TIMES DAILY AS NEEDED FOR GI UPSET  . vitamin B-12 (CYANOCOBALAMIN) 1000 MCG tablet Take 1,000 mcg by mouth daily.  . vitamin E (VITAMIN E) 400 UNIT capsule Take 400 Units by mouth daily.  .             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 Bonchiectasis RLL- CT 2009 > not verified on CT 07/22/10     Past Surgical History:  Inguinal herniorrhaphy  PTCA/stent  LEFT &  RIGHT FOOT RECONSTRUCTION  hiatal hernia repair 2009   Family History:  neg for pituitary dz  mother died age 12 from phlebitis  father died age 53 from heart failure  8 siblings  alive age 33,80,89,92  1 died age 96 heart failure  3 died age 22,76,90 from heart problems    Social History:  married  2 children  retiired  never  smoked  no etoh         Objective:   Physical Exam   Thin pleasant amb wm nad   wt 167 07/05/2010  > 168 08/24/2010 > 04/05/2011 174> 07/12/2011  176> 09/24/2012  169 > 02/25/2013  175 >  01/31/2017   150 > 04/28/2017   156 > 07/31/2017    155  > 10/31/2017  149 > 11/13/2017   154     06/13/16 153 lb 12.8 oz (69.8 kg)  05/10/16 155 lb (70.3 kg)  05/06/16 154 lb (69.9 kg)     Vital signs reviewed - Note on arrival 02 sats  96% on RA       HEENT: nl dentition, turbinates bilaterally, and oropharynx. Nl external ear canals without cough reflex   NECK :  without JVD/Nodes/TM/ nl carotid upstrokes bilaterally   LUNGS: no acc muscle use,  Nl contour chest  With junky insp/ exp bilaterally without cough on insp or exp maneuvers   CV:  RRR  no s3 or murmur or increase in P2, and no edema   ABD:  soft and nontender with nl inspiratory excursion in the supine position. No bruits or organomegaly appreciated, bowel sounds nl  MS:  Nl gait/ ext warm without deformities, calf tenderness, cyanosis or clubbing No obvious joint restrictions   SKIN: warm and dry without lesions    NEURO:  alert, approp, nl sensorium with  no motor or cerebellar deficits apparent.           CXR PA and Lateral:   11/13/2017 :    I personally reviewed images and  impression as follows:   Improved aeration L base        Assessment & Plan:

## 2017-11-13 NOTE — Patient Instructions (Addendum)
For cough > mucinex or mucinex up to 1200 mg every 12 hour with the flutter valve as much as possible   Plan A = Automatic = Symbicort 80 Take 2 puffs first thing in am and then another 2 puffs about 12 hours later.   Work on inhaler technique:  relax and gently blow all the way out then take a nice smooth deep breath back in, triggering the inhaler at same time you start breathing in.  Hold for up to 5 seconds if you can. Blow out thru nose. Rinse and gargle with water when done      Plan B = Backup Only use your albuterol (proventil) as a rescue medication to be used if you can't catch your breath by resting or doing a relaxed purse lip breathing pattern.  - The less you use it, the better it will work when you need it. - Ok to use the inhaler up to 2 puffs  every 4 hours if you must but call for appointment if use goes up over your usual need - Avanish't leave home without it !!  (think of it like the spare tire for your car)   Plan C = Crisis - only use your albuterol(xopenex)  nebulizer if you first try Plan B and it fails to help > ok to use the nebulizer up to every 4 hours but if start needing it regularly call for immediate appointment   Please remember to go to the  x-ray department downstairs in the basement  for your tests - we will call you with the results when they are available.   Please schedule a follow up office visit in 4 weeks, sooner if needed  - add ? Whether to use levaquin as prn ?

## 2017-11-14 ENCOUNTER — Telehealth: Payer: Self-pay | Admitting: Internal Medicine

## 2017-11-14 ENCOUNTER — Encounter: Payer: Self-pay | Admitting: Internal Medicine

## 2017-11-14 NOTE — Progress Notes (Signed)
LMTCB

## 2017-11-14 NOTE — Assessment & Plan Note (Signed)
Improved back to baseline / no further w/u needed

## 2017-11-14 NOTE — Telephone Encounter (Signed)
Notes recorded by Tanda Rockers, MD on 11/14/2017 at 8:46 AM EDT Call pt: Reviewed cxr and no acute change so no change in recommendations made at ov  Pt is aware of results and voiced his understanding. Nothing further is needed.

## 2017-11-14 NOTE — Assessment & Plan Note (Signed)
-   Spirometry 03/30/2010 FEV1  2.86 (116%) ratio 66  - FOB 02/01/2017  Copious retained secretions - Flutter valve re-training 04/28/2017  - PFT's  07/31/2017  FEV1 2.44 (102 % ) ratio 64  p 12 % improvement from saba p symbicort 160  prior to study with DLCO unable to perform  - 07/31/2017  After extensive coaching inhaler device  effectiveness =    75% from a baseline of 50% - 07/31/2017 d/c singulair and add levquin 500 mg daily x 7 day courses prn purulent sputum  11/06/2017- acute exacerbation with purluent sputum and low grade temp.Tx with levaquin - 11/13/2017  After extensive coaching inhaler device,  effectiveness =    75% > added back symb 80 2bid  He continues to act more like bronchiectatic or AB with purulent bronchitis than just copd gold I but point is moot as rx is same: 1) add back symb 80 2bid 2) another 7 days of levaquin as sputum still not mucoid 3) Prednisone 10 mg take  4 each am x 2 days,   2 each am x 2 days,  1 each am x 2 days and stop    F/u in 4 weeks with all meds in hand using a trust but verify approach to confirm accurate Medication  Reconciliation The principal here is that until we are certain that the  patients are doing what we've asked, it makes no sense to ask them to do more.    I had an extended discussion with the patient reviewing all relevant studies completed to date and  lasting 15 to 20 minutes of a 25 minute visit    See device teaching which extended face to face time for this visit.  Each maintenance medication was reviewed in detail including emphasizing most importantly the difference between maintenance and prns and under what circumstances the prns are to be triggered using an action plan format that is not reflected in the computer generated alphabetically organized AVS which I have not found useful in most complex patients, especially with respiratory illnesses  Please see AVS for specific instructions unique to this visit that I personally wrote  and verbalized to the the pt in detail and then reviewed with pt  by my nurse highlighting any  changes in therapy recommended at today's visit to their plan of care.

## 2017-11-16 ENCOUNTER — Telehealth: Payer: Self-pay | Admitting: Internal Medicine

## 2017-11-16 NOTE — Telephone Encounter (Signed)
Spoke with pt, he states he found his nebulizer. I sent a community message to Christin Bach to cancel nebulizer order. Nothing further is needed.

## 2017-11-21 ENCOUNTER — Other Ambulatory Visit: Payer: Self-pay | Admitting: Internal Medicine

## 2017-11-21 ENCOUNTER — Other Ambulatory Visit: Payer: Self-pay | Admitting: Endocrinology

## 2017-11-27 ENCOUNTER — Ambulatory Visit: Payer: Medicare Other | Admitting: Internal Medicine

## 2017-11-27 ENCOUNTER — Ambulatory Visit (INDEPENDENT_AMBULATORY_CARE_PROVIDER_SITE_OTHER)
Admission: RE | Admit: 2017-11-27 | Discharge: 2017-11-27 | Disposition: A | Discharge status: Home / Self Care | Payer: Medicare Other | Source: Ambulatory Visit | Attending: Internal Medicine | Admitting: Internal Medicine

## 2017-11-27 ENCOUNTER — Encounter: Payer: Self-pay | Admitting: Internal Medicine

## 2017-11-27 VITALS — BP 118/74 | HR 82 | Temp 98.3°F | Ht 63.0 in | Wt 146.8 lb

## 2017-11-27 DIAGNOSIS — J479 Bronchiectasis, uncomplicated: Secondary | ICD-10-CM | POA: Insufficient documentation

## 2017-11-27 DIAGNOSIS — R05 Cough: Secondary | ICD-10-CM | POA: Diagnosis not present

## 2017-11-27 DIAGNOSIS — J449 Chronic obstructive pulmonary disease, unspecified: Secondary | ICD-10-CM

## 2017-11-27 DIAGNOSIS — J471 Bronchiectasis with (acute) exacerbation: Secondary | ICD-10-CM | POA: Insufficient documentation

## 2017-11-27 MED ORDER — BUDESONIDE-FORMOTEROL FUMARATE 160-4.5 MCG/ACT IN AERO: 2.0000 | INHALATION_SPRAY | Freq: Two times a day (BID) | RESPIRATORY_TRACT | 11 refills | Status: DC

## 2017-11-27 MED ORDER — LEVOFLOXACIN 500 MG PO TABS: 500.0000 mg | ORAL_TABLET | Freq: Every day | ORAL | 0 refills | Status: DC

## 2017-11-27 MED ORDER — BUDESONIDE-FORMOTEROL FUMARATE 160-4.5 MCG/ACT IN AERO: 2.0000 | INHALATION_SPRAY | Freq: Two times a day (BID) | RESPIRATORY_TRACT | 0 refills | Status: DC

## 2017-11-27 NOTE — Progress Notes (Signed)
Subjective:   Patient ID: [name], male    DOB: [birth year]   MRN: [id number]    Brief patient profile:  84   yowm quit smoking completely around 1980 (relatively light) with cough and short of breath eval by  Dr Eliberto Ivory  Then [name] dx of copd/ cb  pft's showed fev1 116% 02/2010 though ratio 66 c/w GOLD I criteria    History of Present Illness  07/05/2010 ov cc recurrent pna's since June 2011 cc not back to baseline in terms of activities he enjoyed in May, for example  Could do some yardwork  and rarely  Needed saba and no need for any maint rx,   but on  spriva for sob and still frequent tightness generalized front more than back,  Equal both sides  low grade fever and mucus gets thick and yellow> admit to [facility] April 24-27 by [facility] dx of ? Pna.   No sob at rest.   Continue protonix 40 mg  Take 30-60 min before first meal of the day and Pepcid 20 mg at bedtime GERD (REFLUX) diet Try symbicort 160 Take 2 puffs bid and work on hfa technique    08/24/2010 ov/[facility]/[name] cc breathing and cough better despite L rib fx 07/22/10. Main concern is how to prevent future exacerbations.  No limiting sob but not as active since quit smoking. Doing so much better overall rec  Ok to stop spiriva unless it reduces your desired activity performance (it's like high octane fuel for your lungs so your can be all you can be) Continue symbicort 160 Take 2 puffs first thing in am and then another 2 puffs about 12 hours later.   In event of recurrent signs of infection:  Change in sputum, fever, cough, short of breath or sore throat go ahead and take a course of doxycycline - if this doesn't help we need to see you in [place] ([facility]/ [name]) F/u prn      01/31/2017  f/u ov/[facility] re:  Copd 1/ chronic bronchitis / ? Prev aspiraton with abnormal cT chest  ? obst RLL  Chief Complaint  Patient presents with  . Follow-up    Pt states he has had "a touch of PNA". He has been coughing since the beginning on Nov  2018.  He has taken doxy and levaquin.  He is coughing up greyish white sputum.  He has an albuterol inhaler that he rarely uses.   swallowing better now but still very congested cough esp since early nov 2018  Not using flutter valve as rec  hfa has improved but not yet adequate Still some hb/ sore throat symptoms despite reporting ppi bid  Also still some noct cough sev hours p lie down despite using 6 inch blocks rec FOB 02/01/17> tenacious secretions> nl flora/ no tb, no fungus on culture    04/28/2017  f/u ov/Wert re: f/u COPD / cb abn ct again Chief Complaint  Patient presents with  . Acute Visit    Increased cough x 10 days- prod with white to grey sputum. He started on Doxy per Dr Laurance Flatten- has one dose remaining. He states his breathing is unchanged since his last visit. He rarely uses his albuterol inhaler.   Had CT Chest done 04/26/17.    did better p first few days p fob until the end Dec nasty mucus, more sob > doxy then added zpak then better again then worse again last week if Feb 2019 restarted doxy and now  on day #  9/10 on  ct chest 04/26/17 obst again on w/u for TAA Doe = MMRC1 = can walk nl pace, flat grade, can't hurry or go uphills or steps s sob   Sleeps ok but quit a bit of am congestion /mucus still grey   rec Please see patient coordinator before you leave today  to schedule sinus CT> neg  For nasty mucus >  zpak  Prednisone 10 mg take  4 each am x 2 days,   2 each am x 2 days,  1 each am x 2 days and stop  Work on inhaler technique:        07/31/2017  f/u ov/[facility]ert re:  [place] /AB  Chief Complaint  Patient presents with  . Follow-up    Breathing is unchanged since last OV. Reports DOE, chest tightness, wheezing and coughing.  Dyspnea:  MMRC2 = can't walk a nl pace on a flat grade s sob but does fine slow and flat eg  Cough: worse at bedtime and improved p levaquin 250 x 10 days completed 07/17/17   Sleep: cough does wake him up  SABA use:  Not using  rec Stop  singulair Work on inhaler technique:  For cough > mucinex or mucinex dm up to 1200 mg every 12 hours as needed the flutter valve as much as possible For nasty mucus >  levaquin 500 mg daily x 7 days ok to refill      10/31/2017  f/u ov/[facility] re:  Copd gold I/ ab   Chief Complaint  Patient presents with  . Follow-up    Breathing is doing well and no new co's. He rarely uses his albuterol.    Dyspnea:  Not limited by breathing from desired activities  But sedentary  Cough: am thick clear x tbsp but not purulent or bloody  Sleeping: up 6 inch  SABA use: rare 02: no  rec No change    11/01/17 acute onset cough/ dark thick mucus > doxy > no better by the 11/06/17 so changed to levaquin and added prednisone      11/13/2017  f/u ov/Wert re: copd gold I/ AB Chief Complaint  Patient presents with  . Follow-up    Breathing has improved. He still has occ cough and wheezing. He is coughing up some gray to brown sputum. He has not been using his albuterol inhaler, but has been using xopenex neb 2 x daily.    Dyspnea:  Back near baseline Cough: still thick and gray in am Sleeping: 6 in elevated  hob / one pillow SABA use: xopenex neb bid instead of albuterol hfa prn as rec previously/ confused with instructions 02: none  rec For cough > mucinex or mucinex up to 1200 mg every 12 hour with the flutter valve as much as possible Plan A = Automatic = Symbicort 80  2 bid   Plan B = Backup Only use your albuterol (proventil) as a rescue medication   Plan C = Crisis - only use your albuterol(xopenex)  nebulizer if you first try Plan B   Please remember to go to the  x-ray department downstairs in the basement  for your tests - we will call you with the results when they are available. Please schedule a follow up office visit in 4 weeks, sooner if needed  - add ? Whether to use levaquin as prn ?         11/27/2017 acute extended ov/Wert re: acute  chills, low grade fever, mucus changing to darker   Chief Complaint  Patient presents with  . Acute Visit    started having chills and low grade temp 1 day ago. He has sweats last night.    was great p last course of levaquin with noct cough mucoid turned darker 4 days  prior to OV  Then acute onset chills one day prio but no change chronic doe/ comfortable at rest   No obvious day to day or daytime variability or assoc   mucus plugs or hemoptysis or cp or chest tightness, subjective wheeze or overt sinus or hb symptoms.     Also denies any obvious fluctuation of symptoms with weather or environmental changes or other aggravating or alleviating factors except as outlined above   No unusual exposure hx or h/o childhood pna/ asthma or knowledge of premature birth.  Current Allergies, Complete Past Medical History, Past Surgical History, Family History, and Social History were reviewed in [employer] record.  ROS  The following are not active complaints unless bolded Hoarseness, sore throat, dysphagia, dental problems, itching, sneezing,  nasal congestion or discharge of excess mucus or purulent secretions, ear ache,   fever, chills, sweats, unintended wt loss or wt gain, classically pleuritic or exertional cp,  orthopnea pnd or arm/hand swelling  or leg swelling, presyncope, palpitations, abdominal pain, anorexia, nausea, vomiting, diarrhea  or change in bowel habits or change in bladder habits, change in stools or change in urine, dysuria, hematuria,  rash, arthralgias, visual complaints, headache, numbness, weakness or ataxia or problems with walking or coordination,  change in mood or  memory.        Current Meds  Medication Sig  . albuterol (PROVENTIL HFA;VENTOLIN HFA) 108 (90 Base) MCG/ACT inhaler Inhale 2 puffs into the lungs every 6 (six) hours as needed for wheezing or shortness of breath.  . bisacodyl (DULCOLAX) 10 MG suppository Place 10 mg rectally as needed for moderate constipation.  . bromocriptine  (PARLODEL) 2.5 MG tablet Take 2.5 mg by mouth 2 (two) times daily.  . calcium carbonate (OSCAL) 1500 (600 Ca) MG TABS tablet Take 600 mg of elemental calcium by mouth daily with breakfast.   . cholecalciferol (VITAMIN D) 1000 UNITS tablet Take 1,000 Units by mouth daily.   . diclofenac (VOLTAREN) 75 MG EC tablet Take 1 tablet (75 mg total) by mouth 2 (two) times daily.  Marland Kitchen diltiazem (CARDIZEM CD) 120 MG 24 hr capsule TAKE (1) CAPSULE DAILY  . ferrous sulfate 325 (65 FE) MG tablet Take 325 mg by mouth daily with breakfast.   . guaiFENesin (MUCINEX) 600 MG 12 hr tablet Take 600 mg by mouth daily.   Marland Kitchen levalbuterol (XOPENEX) 0.63 MG/3ML nebulizer solution Take 3 mLs (0.63 mg total) by nebulization every 6 (six) hours as needed for wheezing or shortness of breath.  . levothyroxine (SYNTHROID, LEVOTHROID) 50 MCG tablet TAKE 1 TABLET IN MORNING  . magnesium oxide (MAG-OX) 400 (241.3 Mg) MG tablet Take 400 mg by mouth every other day.  . naphazoline-pheniramine (NAPHCON-A) 0.025-0.3 % ophthalmic solution Place 1 drop into both eyes 2 (two) times daily as needed for irritation or allergies.   . nitroGLYCERIN (NITROSTAT) 0.4 MG SL tablet Place 1 tablet (0.4 mg total) under the tongue every 5 (five) minutes as needed for chest pain.  . pantoprazole (PROTONIX) 40 MG tablet TAKE (1) TABLET TWICE A DAY BEFORE MEALS.  Marland Kitchen polyethylene glycol (MIRALAX / GLYCOLAX) packet Take 17 g by mouth daily. (  Patient taking differently: Take 17 g by mouth daily as needed for moderate constipation. )  . Respiratory Therapy Supplies (FLUTTER) DEVI 1 Device by Does not apply route as needed.  . sucralfate (CARAFATE) 1 g tablet TAKE (1) TABLET FOUR TIMES DAILY AS NEEDED FOR GI UPSET  . sucralfate (CARAFATE) 1 g tablet TAKE (1) TABLET FOUR TIMES DAILY AS NEEDED FOR GI UPSET  . vitamin B-12 (CYANOCOBALAMIN) 1000 MCG tablet Take 1,000 mcg by mouth daily.  . vitamin E (VITAMIN E) 400 UNIT capsule Take 400 Units by mouth daily.  . [   budesonide-formoterol (SYMBICORT) 80-4.5 MCG/ACT inhaler Inhale 2 puffs into the lungs 2 (two) times daily.                 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 Bonchiectasis RLL- CT 2009 > not verified on CT 07/22/10     Past Surgical History:  Inguinal herniorrhaphy  PTCA/stent  LEFT & RIGHT FOOT RECONSTRUCTION  hiatal hernia repair 2009   Family History:  neg for pituitary dz  mother died age 47 from phlebitis  father died age 2 from heart failure  8 siblings  alive age 44,80,89,92  1 died age 35 heart failure  3 died age 25,76,90 from heart problems    Social History:  married  2 children  retiired  never smoked  no etoh         Objective:   Physical Exam   Thin pleasant amb wm nad not toxic appearing at all   wt 167 07/05/2010  > 168 08/24/2010 > 04/05/2011 174> 07/12/2011  176> 09/24/2012  169 > 02/25/2013  175 >  01/31/2017   150 > 04/28/2017   156 > 07/31/2017    155  > 10/31/2017  149 > 11/13/2017   154 > 11/27/2017  146     06/13/16 153 lb 12.8 oz (69.8 kg)  05/10/16 155 lb (70.3 kg)  05/06/16 154 lb (69.9 kg)    Vital signs reviewed - Note on arrival 02 sats  96% on RA     HEENT: nl dentition, turbinates bilaterally, and oropharynx. Nl external ear canals without cough reflex   NECK :  without JVD/Nodes/TM/ nl carotid upstrokes bilaterally   LUNGS: no acc muscle use,  Nl contour chest with junky insp/exp rhonchi  bilaterally without cough on insp or exp maneuvers   CV:  RRR  no s3 or  murmur or increase in P2, and no edema   ABD:  soft and nontender with nl inspiratory excursion in the supine position. No bruits or organomegaly appreciated, bowel sounds nl  MS:  Nl gait/ ext warm without deformities, calf tenderness, cyanosis or clubbing No obvious joint restrictions   SKIN: warm and dry without lesions    NEURO:  alert, approp, nl sensorium with  no motor or cerebellar deficits apparent.          CXR PA and Lateral:   11/27/2017 :    I personally reviewed images and agree with radiology impression as follows:    Bibasilar atelectasis and persistent LEFT lower  lobe infiltrate. Underlying COPD.           Assessment & Plan:

## 2017-11-27 NOTE — Telephone Encounter (Signed)
I called pt to discuss. He made an appt for 11:00 with Dr. Melvyn Novas for f/u since he is still running a fever and having colored mucus. He will e-mail me back to confirm appt, he has to check to see if he has a ride to get here. Will await e-mail.

## 2017-11-27 NOTE — Progress Notes (Signed)
Spoke with pt and notified of results per Dr. Wert. Pt verbalized understanding and denied any questions. 

## 2017-11-27 NOTE — Patient Instructions (Addendum)
For nasty mucus /fever >  Take levaquin 500 mg daily x 10 days    For cough > mucinex or mucinex up to 1200 mg every 12 hour and use the flutter valve as much as possible  Plan A = Automatic = Symbicort 80 Take 2 puffs first thing in am and then another 2 puffs about 12 hours later.   Work on inhaler technique:  relax and gently blow all the way out then take a nice smooth deep breath back in, triggering the inhaler at same time you start breathing in.  Hold for up to 5 seconds if you can. Blow out symbicort thru nose. Rinse and gargle with water when done   Plan B = Backup Only use your albuterol (proventil) as a rescue medication to be used if you can't catch your breath by resting or doing a relaxed purse lip breathing pattern.  - The less you use it, the better it will work when you need it. - Ok to use the inhaler up to 2 puffs  every 4 hours if you must but call for appointment if use goes up over your usual need - Seab't leave home without it !!  (think of it like the spare tire for your car)   Plan C = Crisis - only use your albuterol(xopenex)  nebulizer if you first try Plan B and it fails to help > ok to use the nebulizer up to every 4 hours but if start needing it regularly call for immediate appointment   Please remember to go to the x-ray department downstairs in the basement  for your tests - we will call you with the results when they are available.  Keep previous appt but bring all inhalers/meds with you at that time

## 2017-11-27 NOTE — Telephone Encounter (Signed)
Pt send an email to confirm appt at 11:00am with MW. Nothing further is needed.

## 2017-11-29 ENCOUNTER — Encounter: Payer: Self-pay | Admitting: Internal Medicine

## 2017-11-29 NOTE — Assessment & Plan Note (Signed)
-   Spirometry 03/30/2010 FEV1  2.86 (116%) ratio 66  - FOB 02/01/2017  Copious retained secretions - Flutter valve re-training 04/28/2017  - PFT's  07/31/2017  FEV1 2.44 (102 % ) ratio 64  p 12 % improvement from saba p symbicort 160  prior to study with DLCO unable to perform  - 07/31/2017  After extensive coaching inhaler device  effectiveness =    75% from a baseline of 50% - 07/31/2017 d/c singulair and add levquin 500 mg daily x 7 day courses prn purulent sputum 11/06/2017- acute exacerbation with purluent sputum and low grade temp.Tx with levaquin - 11/13/2017    added back symb 80 2bid   11/27/2017  After extensive coaching inhaler device,  effectiveness =    75% from a baseline of < 50%   Due to concerns with recurrent infection plan to keep the ics dose on the lower side if possible here and emphasized approp use of mucinex/ flutter valve to help with cough mechanics   I had an extended discussion with the patient reviewing all relevant studies completed to date and  lasting 15 to 20 minutes of a 25 minute visit    See device teaching which extended face to face time for this visit.  Each maintenance medication was reviewed in detail including emphasizing most importantly the difference between maintenance and prns and under what circumstances the prns are to be triggered using an action plan format that is not reflected in the computer generated alphabetically organized AVS which I have not found useful in most complex patients, especially with respiratory illnesses  Please see AVS for specific instructions unique to this visit that I personally wrote and verbalized to the the pt in detail and then reviewed with pt  by my nurse highlighting any  changes in therapy recommended at today's visit to their plan of care.

## 2017-11-29 NOTE — Assessment & Plan Note (Signed)
Strongly suspect he has bronchiectasis in addition to copd but dx is moot at this point as rx is the same   rec add levaquin 500 mg x 10 days and f/u as planned with HRCT chest /sinus if not responding

## 2017-12-01 ENCOUNTER — Ambulatory Visit: Payer: Medicare Other | Admitting: Family Medicine

## 2017-12-04 ENCOUNTER — Telehealth: Payer: Self-pay | Admitting: Internal Medicine

## 2017-12-04 NOTE — Telephone Encounter (Signed)
Attempted to contact pt. I did not receive an answer. There was no option for me to leave a message. Will try back.  

## 2017-12-05 NOTE — Telephone Encounter (Signed)
Done  LMTCB- does he want this mailed or picked up?

## 2017-12-05 NOTE — Telephone Encounter (Signed)
Patient came by to drop off paperwork for MW to complete. Paperwork is in [employer] folder at the front desk. CB is [phone]

## 2017-12-05 NOTE — Telephone Encounter (Signed)
Done and placed up front for pick up

## 2017-12-05 NOTE — Telephone Encounter (Signed)
Routing to leslie to follow up on.

## 2017-12-05 NOTE — Telephone Encounter (Signed)
Paper Work has been received, supporting documents printed per paperwork instructions and placed in MW to do folder. Will await completion.   MW please advise once paperwork has been completed.

## 2017-12-05 NOTE — Telephone Encounter (Signed)
done

## 2017-12-05 NOTE — Telephone Encounter (Signed)
Pt is calling back about the papers. [phone]     Pt will pick up the papers.

## 2017-12-05 NOTE — Telephone Encounter (Signed)
Called and spoke with patient, he stated that he has some paperwork that needs to be filled out due to missing a vacation due to his illness. Advised patient that he can bring them in for [place] to look at. Will leave encounter open until paperwork in received.

## 2017-12-07 DIAGNOSIS — D692 Other nonthrombocytopenic purpura: Secondary | ICD-10-CM | POA: Diagnosis not present

## 2017-12-07 DIAGNOSIS — Z85820 Personal history of malignant melanoma of skin: Secondary | ICD-10-CM | POA: Diagnosis not present

## 2017-12-07 DIAGNOSIS — D485 Neoplasm of uncertain behavior of skin: Secondary | ICD-10-CM | POA: Diagnosis not present

## 2017-12-07 DIAGNOSIS — L821 Other seborrheic keratosis: Secondary | ICD-10-CM | POA: Diagnosis not present

## 2017-12-07 DIAGNOSIS — R05 Cough: Secondary | ICD-10-CM

## 2017-12-07 DIAGNOSIS — D225 Melanocytic nevi of trunk: Secondary | ICD-10-CM | POA: Diagnosis not present

## 2017-12-07 DIAGNOSIS — R059 Cough, unspecified: Secondary | ICD-10-CM

## 2017-12-08 NOTE — Telephone Encounter (Signed)
He should read his instructions for specific symptoms that may flare between now and next ov and if new symptoms that aren't listed then ov needed   In meantime would go ahead with HRCT Chest and sinus CT dx cough

## 2017-12-11 ENCOUNTER — Ambulatory Visit: Payer: Medicare Other | Admitting: Internal Medicine

## 2017-12-13 ENCOUNTER — Ambulatory Visit: Payer: Medicare Other | Admitting: Internal Medicine

## 2017-12-15 ENCOUNTER — Ambulatory Visit (INDEPENDENT_AMBULATORY_CARE_PROVIDER_SITE_OTHER): Payer: Medicare Other | Admitting: Family Medicine

## 2017-12-15 ENCOUNTER — Encounter: Payer: Self-pay | Admitting: Family Medicine

## 2017-12-15 VITALS — BP 111/68 | HR 67 | Temp 97.3°F | Ht 63.0 in | Wt 148.0 lb

## 2017-12-15 DIAGNOSIS — E559 Vitamin D deficiency, unspecified: Secondary | ICD-10-CM | POA: Diagnosis not present

## 2017-12-15 DIAGNOSIS — I7121 Aneurysm of the ascending aorta, without rupture: Secondary | ICD-10-CM

## 2017-12-15 DIAGNOSIS — E034 Atrophy of thyroid (acquired): Secondary | ICD-10-CM

## 2017-12-15 DIAGNOSIS — K219 Gastro-esophageal reflux disease without esophagitis: Secondary | ICD-10-CM | POA: Diagnosis not present

## 2017-12-15 DIAGNOSIS — I712 Thoracic aortic aneurysm, without rupture: Secondary | ICD-10-CM

## 2017-12-15 DIAGNOSIS — E221 Hyperprolactinemia: Secondary | ICD-10-CM

## 2017-12-15 DIAGNOSIS — J42 Unspecified chronic bronchitis: Secondary | ICD-10-CM

## 2017-12-15 DIAGNOSIS — I4891 Unspecified atrial fibrillation: Secondary | ICD-10-CM | POA: Diagnosis not present

## 2017-12-15 DIAGNOSIS — I700 Atherosclerosis of aorta: Secondary | ICD-10-CM

## 2017-12-15 DIAGNOSIS — M40209 Unspecified kyphosis, site unspecified: Secondary | ICD-10-CM

## 2017-12-15 DIAGNOSIS — E785 Hyperlipidemia, unspecified: Secondary | ICD-10-CM

## 2017-12-15 MED ORDER — BUSPIRONE HCL 15 MG PO TABS: 15.0000 mg | ORAL_TABLET | Freq: Two times a day (BID) | ORAL | 1 refills | Status: DC | PRN

## 2017-12-15 NOTE — Progress Notes (Signed)
Subjective:    Patient ID: [name], male    DOB: [birth year], 84 y.o.   MRN: [id number]  HPI Pt here for follow up and management of chronic medical problems which includes hypothyroid, a fib and hyperlipidemia. He is taking medication regularly.  The patient has had a recent upper respiratory infection and has seen the pulmonologist.  His height has decreased.  He has ongoing anxiety and numbness in the right arm at night.  He was given an FOBT to return and will come back in for lab work when he is fasting.  He is going to wait on the flu shot.  The patient was seen by his pulmonologist in September and has had some ongoing conversations with him since that visit.  Dr. work has him back on Symbicort and he had them take another 7 days of Levaquin along with some prednisone.  He is doing 2 puffs of Symbicort twice daily.  He plans to follow him up again 4 weeks after that visit which should be coming up soon.  This patient has multiple issues going on.  He has not aortic aneurysm and is being followed by the vascular surgeon he has chronic obstructive pulmonary disease and is being followed regularly by the pulmonologist.  He has this thoracic aortic aneurysm and that is the reason for the follow-up by the vascular surgeon he has peripheral vascular disease and arthritis in both feet.  He also has had gastritis and is followed regularly by the gastroenterologist and has had frequent bleeds in the past and has to stay on iron and Carafate indefinitely along with pantoprazole.  The patient today has several medical problems.  He is keeping up with all of his appointments with the pulmonologist the vascular surgeon the urologist.  And the gastroenterologist.  He does complain with some anxiety and we discussed something that was not habit forming like BuSpar and he is willing to try that to take if needed.  Today he denies any chest pain.  He has shortness of breath ongoing with his COPD that is stable  but he is concerned about his slowly getting worse with his COPD.  He does follow-up regularly with the pulmonologist.  GI wise everything seems to be stable he has had bleeding problems in the past but as long as he does the Carafate and the Protonix he seems to stay under better control and he gets somewhat constipated and has to take a fleets enema periodically.  He has been followed by the gastroenterologist, Dr. Hilarie Fredrickson.  He sees Dr. Alyson Ingles for his urological issues and right now other than having to wear a depends he seems to be stable with that.  He understands the importance for all of his medical conditions to drink more water to keep his bowels working to keep the congestion more loose in his chest and to pass his water better that he needs to drink more fluids and he concurs with that.  We did discuss the fact that he has osteoporosis and has had to be on multiple rounds of prednisone and that this contributes to his height loss and he understands that that the prednisone has been a necessity.    Patient Active Problem List   Diagnosis Date Noted  . Bronchiectasis with (acute) exacerbation ([place]) 11/27/2017  . COPD with acute exacerbation ([place]) 11/07/2017  . Peripheral vascular insufficiency ([place]) 07/19/2017  . Aneurysmal dilatation ([place]) 07/19/2017  . Chronic cough 04/28/2017  . Chronic bronchitis ([place])  03/23/2017  . Aortic atherosclerosis ([place]) 11/21/2016  . Thoracic ascending aortic aneurysm ([place]) 08/10/2016  . Atherosclerosis of native coronary artery of native heart without angina pectoris 08/10/2016  . Gastritis 05/04/2016  . Hiatal hernia 05/04/2016  . Abnormal chest x-ray 03/28/2016  . Esophageal stricture   . Non-intractable vomiting   . Abdominal pain, epigastric   . Dysphagia   . Atrial fibrillation with rapid ventricular response ([place])   . Elevated troponin   . Atrial fibrillation with RVR ([place]) 06/12/2015  . Pneumonia 06/12/2015  . Leukocytosis 06/12/2015  . Clot  retention of urine 06/11/2015  . BPH (benign prostatic hypertrophy) with urinary obstruction 06/11/2015  . Postoperative anemia due to acute blood loss 06/11/2015  . Hematuria 06/10/2015  . Neck pain, bilateral 12/31/2013  . Neuropathy 12/31/2013  . Vitamin B 12 deficiency 12/31/2013  . Abnormality of gait 06/07/2013  . Esophageal ulcer with bleeding 04/20/2013  . S/P left and ritght THA, AA 07/21/2011  . Special screening for malignant neoplasms, colon 01/03/2011  . Personal history of colonic polyps 01/03/2011  . COPD GOLD I 08/24/2010  . GI BLEED 11/17/2009  . ESOPHAGEAL REFLUX 08/25/2009  . BLOOD IN STOOL-MELENA 08/25/2009  . COLONIC POLYPS 07/17/2008  . ANEMIA 07/17/2008  . HYPOTENSION 07/17/2008  . Gastroesophageal reflux disease with esophagitis 07/17/2008  . DIVERTICULOSIS, MILD 07/17/2008  . DEGENERATIVE JOINT DISEASE 07/17/2008  . Osteoporosis 07/17/2008  . BPH (benign prostatic hyperplasia) 07/17/2008  . Hyperprolactinemia ([place]) 10/24/2007  . PITUITARY ADENOMA 06/25/2007  . Hypothyroidism 06/25/2007  . Hyperlipidemia LDL goal <70 06/22/2007  . Coronary atherosclerosis 06/22/2007   Outpatient Encounter Medications as of 12/15/2017  Medication Sig  . albuterol (PROVENTIL HFA;VENTOLIN HFA) 108 (90 Base) MCG/ACT inhaler Inhale 2 puffs into the lungs every 6 (six) hours as needed for wheezing or shortness of breath.  . bisacodyl (DULCOLAX) 10 MG suppository Place 10 mg rectally as needed for moderate constipation.  . bromocriptine (PARLODEL) 2.5 MG tablet Take 2.5 mg by mouth 2 (two) times daily.  . budesonide-formoterol (SYMBICORT) 160-4.5 MCG/ACT inhaler Inhale 2 puffs into the lungs 2 (two) times daily.  . calcium carbonate (OSCAL) 1500 (600 Ca) MG TABS tablet Take 600 mg of elemental calcium by mouth daily with breakfast.   . cholecalciferol (VITAMIN D) 1000 UNITS tablet Take 1,000 Units by mouth daily.   Marland Kitchen diltiazem (CARDIZEM CD) 120 MG 24 hr capsule TAKE (1) CAPSULE  DAILY  . ferrous sulfate 325 (65 FE) MG tablet Take 325 mg by mouth daily with breakfast.   . guaiFENesin (MUCINEX) 600 MG 12 hr tablet Take 600 mg by mouth daily.   Marland Kitchen levalbuterol (XOPENEX) 0.63 MG/3ML nebulizer solution Take 3 mLs (0.63 mg total) by nebulization every 6 (six) hours as needed for wheezing or shortness of breath.  . levothyroxine (SYNTHROID, LEVOTHROID) 50 MCG tablet TAKE 1 TABLET IN MORNING  . magnesium oxide (MAG-OX) 400 (241.3 Mg) MG tablet Take 400 mg by mouth every other day.  . naphazoline-pheniramine (NAPHCON-A) 0.025-0.3 % ophthalmic solution Place 1 drop into both eyes 2 (two) times daily as needed for irritation or allergies.   . nitroGLYCERIN (NITROSTAT) 0.4 MG SL tablet Place 1 tablet (0.4 mg total) under the tongue every 5 (five) minutes as needed for chest pain.  . pantoprazole (PROTONIX) 40 MG tablet TAKE (1) TABLET TWICE A DAY BEFORE MEALS.  Marland Kitchen polyethylene glycol (MIRALAX / GLYCOLAX) packet Take 17 g by mouth daily. (Patient taking differently: Take 17 g by mouth daily as needed  for moderate constipation. )  . Respiratory Therapy Supplies (FLUTTER) DEVI 1 Device by Does not apply route as needed.  . sucralfate (CARAFATE) 1 g tablet TAKE (1) TABLET FOUR TIMES DAILY AS NEEDED FOR GI UPSET  . vitamin B-12 (CYANOCOBALAMIN) 1000 MCG tablet Take 1,000 mcg by mouth daily.  . vitamin E (VITAMIN E) 400 UNIT capsule Take 400 Units by mouth daily.  . [DISCONTINUED] diclofenac (VOLTAREN) 75 MG EC tablet Take 1 tablet (75 mg total) by mouth 2 (two) times daily.  . [DISCONTINUED] levofloxacin (LEVAQUIN) 500 MG tablet Take 1 tablet (500 mg total) by mouth daily.  . [DISCONTINUED] sucralfate (CARAFATE) 1 g tablet TAKE (1) TABLET FOUR TIMES DAILY AS NEEDED FOR GI UPSET   No facility-administered encounter medications on file as of 12/15/2017.       Review of Systems  Constitutional: Negative.   HENT: Negative.   Eyes: Negative.   Respiratory: Negative.        Recent URI -  following Pulmonary DR   Cardiovascular: Negative.   Gastrointestinal: Negative.   Endocrine: Negative.   Genitourinary: Negative.   Musculoskeletal: Negative.        Decreased height over the years  Skin: Negative.   Allergic/Immunologic: Negative.   Neurological: Positive for numbness (right arm and hand - at night).  Hematological: Negative.   Psychiatric/Behavioral: The patient is nervous/anxious.        Objective:   Physical Exam  Constitutional: He is oriented to person, place, and time. He appears well-developed and well-nourished.  The patient is pleasant and alert and dealing with a lot of medical problems which I think he is doing very well with considering the extensiveness of these problems.  He is monitored closely by his family.  He lives by himself.  HENT:  Head: Normocephalic and atraumatic.  Right Ear: External ear normal.  Left Ear: External ear normal.  Nose: Nose normal.  Mouth/Throat: Oropharynx is clear and moist. No oropharyngeal exudate.  Eyes: Pupils are equal, round, and reactive to light. Conjunctivae and EOM are normal. Right eye exhibits no discharge. Left eye exhibits no discharge. No scleral icterus.  Neck: Normal range of motion. Neck supple. No thyromegaly present.  Cardiovascular: Normal rate, regular rhythm and normal heart sounds.  No murmur heard. Heart is regular at 72/min  Pulmonary/Chest: Effort normal. No respiratory distress. He has wheezes. He has no rales. He exhibits no tenderness.  Diminished breath sounds bilaterally with some expiratory wheezes and increased congestion with coughing.  No axillary adenopathy or chest wall tenderness  Abdominal: Soft. Bowel sounds are normal. He exhibits no mass. There is no tenderness. There is no rebound and no guarding.  Slight epigastric tenderness without liver or spleen enlargement masses bruits or inguinal adenopathy  Genitourinary:  Genitourinary Comments: Patient sees urologist regularly and as  needed.  Musculoskeletal: Normal range of motion. He exhibits tenderness and deformity. He exhibits no edema.  Patient does have kyphotic posture.  He reminds me that his lost 6 inches of  height.  I reminded him that he must be careful and avoid any lifting pushing pulling or climbing.  Lymphadenopathy:    He has no cervical adenopathy.  Neurological: He is alert and oriented to person, place, and time. He has normal reflexes. No cranial nerve deficit.  Brisk reflexes in the lower extremity are equal bilaterally  Skin: Skin is warm and dry. No rash noted.  Psychiatric: He has a normal mood and affect. His behavior is normal. Judgment and  thought content normal.  Nursing note and vitals reviewed.  BP 111/68 (BP Location: Left Arm)   Pulse 67   Temp (!) 97.3 F (36.3 C) (Oral)   Ht _0  (1.6 m)   Wt 148 lb (67.1 kg)   BMI 26.22 kg/m         Assessment & Plan:  1. Hypothyroidism due to acquired atrophy of thyroid -Continue current treatment pending results of lab work. - BMP8+EGFR; Future - CBC with Differential/Platelet; Future - Thyroid Panel With TSH; Future  2. Atrial fibrillation with RVR ([place]) -The heart today had a regular rate and rhythm at 72/min. - CBC with Differential/Platelet; Future  3. Vitamin D deficiency -Continue vitamin D replacement pending results of lab work - CBC with Differential/Platelet; Future - VITAMIN D 25 Hydroxy (Vit-D Deficiency, Fractures); Future  4. Gastroesophageal reflux disease, esophagitis presence not specified -Continue Protonix and Carafate per direction of gastroenterologist indefinitely - CBC with Differential/Platelet; Future  5. Thoracic ascending aortic aneurysm ([facility]) -Follow-up with Dr. Mohammed Kindle the vascular surgeon as planned and get repeat scan to monitor thoracic aneurysm - CBC with Differential/Platelet; Future - Lipid panel; Future - Hepatic function panel; Future  6. Aortic atherosclerosis ([place]) -Continue  aggressive therapeutic lifestyle changes - BMP8+EGFR; Future - CBC with Differential/Platelet; Future - Lipid panel; Future - Hepatic function panel; Future  7. Hyperlipidemia LDL goal <70 -Continue current treatment along with therapeutic lifestyle changes pending results of lab work - CBC with Differential/Platelet; Future - Lipid panel; Future  8. Kyphosis, unspecified kyphosis type, unspecified spinal region -Avoid climbing maintain posture is much as possible and take calcium and vitamin D  9. Hyperprolactinemia ([place]) -Follow-up with neurosurgery as planned  10. Chronic bronchitis, unspecified chronic bronchitis type [facility]) -Follow-up with pulmonology as planned  Patient Instructions                       Medicare Annual Wellness Visit  [facility] and the medical providers at [facility] strive to bring you the best medical care.  In doing so we not only want to address your current medical conditions and concerns but also to detect new conditions early and prevent illness, disease and health-related problems.    Medicare offers a yearly Wellness Visit which allows our clinical staff to assess your need for preventative services including immunizations, lifestyle education, counseling to decrease risk of preventable diseases and screening for fall risk and other medical concerns.    This visit is provided free of charge (no copay) for all Medicare recipients. The clinical pharmacists at [facility] have begun to conduct these Wellness Visits which will also include a thorough review of all your medications.    As you primary medical provider recommend that you make an appointment for your Annual Wellness Visit if you have not done so already this year.  You may set up this appointment before you leave today or you may call back ([phone]) and schedule an appointment.  Please make sure when you call that you mention that you are  scheduling your Annual Wellness Visit with the clinical pharmacist so that the appointment may be made for the proper length of time.     Continue current medications. Continue good therapeutic lifestyle changes which include good diet and exercise. Fall precautions discussed with patient. If an FOBT was given today- please return it to our front desk. If you are over 73 years old - you may need Prevnar 4  or the adult Pneumonia vaccine.  **Flu shots are available--- please call and schedule a FLU-CLINIC appointment**  After your visit with [place] today you will receive a survey in the mail or online from [employer] regarding your care with [place]. Please take a moment to fill this out. Your feedback is very important to [place] as you can help [place] better understand your patient needs as well as improve your experience and satisfaction. WE CARE ABOUT YOU!!!   Follow-up with pulmonology urology and vascular surgery as planned Follow-up with gastro enterologist as planned Drink more water Use nasal saline frequently through the day Take Mucinex maximum strength, 1 twice daily for cough and congestion Use Protonix and Carafate as directed by the gastroenterologist Return the FOBT after checking this  Arrie Senate MD

## 2017-12-15 NOTE — Patient Instructions (Addendum)
Medicare Annual Wellness Visit  [facility] and the medical providers at [facility] strive to bring you the best medical care.  In doing so we not only want to address your current medical conditions and concerns but also to detect new conditions early and prevent illness, disease and health-related problems.    Medicare offers a yearly Wellness Visit which allows our clinical staff to assess your need for preventative services including immunizations, lifestyle education, counseling to decrease risk of preventable diseases and screening for fall risk and other medical concerns.    This visit is provided free of charge (no copay) for all Medicare recipients. The clinical pharmacists at [facility] have begun to conduct these Wellness Visits which will also include a thorough review of all your medications.    As you primary medical provider recommend that you make an appointment for your Annual Wellness Visit if you have not done so already this year.  You may set up this appointment before you leave today or you may call back ([phone]) and schedule an appointment.  Please make sure when you call that you mention that you are scheduling your Annual Wellness Visit with the clinical pharmacist so that the appointment may be made for the proper length of time.     Continue current medications. Continue good therapeutic lifestyle changes which include good diet and exercise. Fall precautions discussed with patient. If an FOBT was given today- please return it to our front desk. If you are over 30 years old - you may need Prevnar 10 or the adult Pneumonia vaccine.  **Flu shots are available--- please call and schedule a FLU-CLINIC appointment**  After your visit with [place] today you will receive a survey in the mail or online from [employer] regarding your care with [place]. Please take a moment to fill this out. Your feedback is very  important to [place] as you can help [place] better understand your patient needs as well as improve your experience and satisfaction. WE CARE ABOUT YOU!!!   Follow-up with pulmonology urology and vascular surgery as planned Follow-up with gastro enterologist as planned Drink more water Use nasal saline frequently through the day Take Mucinex maximum strength, 1 twice daily for cough and congestion Use Protonix and Carafate as directed by the gastroenterologist Return the FOBT after checking this

## 2017-12-18 ENCOUNTER — Other Ambulatory Visit: Payer: Medicare Other

## 2017-12-18 DIAGNOSIS — I7121 Aneurysm of the ascending aorta, without rupture: Secondary | ICD-10-CM

## 2017-12-18 DIAGNOSIS — I712 Thoracic aortic aneurysm, without rupture: Secondary | ICD-10-CM

## 2017-12-18 DIAGNOSIS — Z1211 Encounter for screening for malignant neoplasm of colon: Secondary | ICD-10-CM

## 2017-12-18 DIAGNOSIS — E559 Vitamin D deficiency, unspecified: Secondary | ICD-10-CM | POA: Diagnosis not present

## 2017-12-18 DIAGNOSIS — E034 Atrophy of thyroid (acquired): Secondary | ICD-10-CM | POA: Diagnosis not present

## 2017-12-18 DIAGNOSIS — E785 Hyperlipidemia, unspecified: Secondary | ICD-10-CM | POA: Diagnosis not present

## 2017-12-18 DIAGNOSIS — K219 Gastro-esophageal reflux disease without esophagitis: Secondary | ICD-10-CM

## 2017-12-18 DIAGNOSIS — I700 Atherosclerosis of aorta: Secondary | ICD-10-CM | POA: Diagnosis not present

## 2017-12-18 DIAGNOSIS — I4891 Unspecified atrial fibrillation: Secondary | ICD-10-CM

## 2017-12-19 LAB — CBC WITH DIFFERENTIAL/PLATELET
Basophils Absolute: 0.2 10*3/uL (ref 0.0–0.2)
Basos: 2 %
EOS (ABSOLUTE): 0.2 10*3/uL (ref 0.0–0.4)
Eos: 3 %
Hematocrit: 36.1 % — ABNORMAL LOW (ref 37.5–51.0)
Hemoglobin: 11.8 g/dL — ABNORMAL LOW (ref 13.0–17.7)
Immature Grans (Abs): 0.4 10*3/uL — ABNORMAL HIGH (ref 0.0–0.1)
Immature Granulocytes: 5 %
Lymphocytes Absolute: 1.4 10*3/uL (ref 0.7–3.1)
Lymphs: 20 %
MCH: 32.1 pg (ref 26.6–33.0)
MCHC: 32.7 g/dL (ref 31.5–35.7)
MCV: 98 fL — ABNORMAL HIGH (ref 79–97)
Monocytes Absolute: 1.1 10*3/uL — ABNORMAL HIGH (ref 0.1–0.9)
Monocytes: 16 %
Neutrophils Absolute: 3.7 10*3/uL (ref 1.4–7.0)
Neutrophils: 54 %
Platelets: 274 10*3/uL (ref 150–450)
RBC: 3.68 x10E6/uL — ABNORMAL LOW (ref 4.14–5.80)
RDW: 14.1 % (ref 12.3–15.4)
WBC: 7 10*3/uL (ref 3.4–10.8)

## 2017-12-19 LAB — BMP8+EGFR
BUN/Creatinine Ratio: 14 (ref 10–24)
BUN: 15 mg/dL (ref 8–27)
CO2: 24 mmol/L (ref 20–29)
Calcium: 8.7 mg/dL (ref 8.6–10.2)
Chloride: 104 mmol/L (ref 96–106)
Creatinine, Ser: 1.04 mg/dL (ref 0.76–1.27)
GFR calc Af Amer: 76 mL/min/{1.73_m2} (ref >59)
GFR calc non Af Amer: 66 mL/min/{1.73_m2} (ref >59)
Glucose: 89 mg/dL (ref 65–99)
Potassium: 4 mmol/L (ref 3.5–5.2)
Sodium: 140 mmol/L (ref 134–144)

## 2017-12-19 LAB — HEPATIC FUNCTION PANEL
ALT: 7 IU/L (ref 0–44)
AST: 18 IU/L (ref 0–40)
Albumin: 3.6 g/dL (ref 3.5–4.7)
Alkaline Phosphatase: 75 IU/L (ref 39–117)
Bilirubin Total: 0.5 mg/dL (ref 0.0–1.2)
Bilirubin, Direct: 0.14 mg/dL (ref 0.00–0.40)
Total Protein: 6.1 g/dL (ref 6.0–8.5)

## 2017-12-19 LAB — THYROID PANEL WITH TSH
Free Thyroxine Index: 1.6 (ref 1.2–4.9)
T3 Uptake Ratio: 28 % (ref 24–39)
T4, Total: 5.6 ug/dL (ref 4.5–12.0)
TSH: 4.15 u[IU]/mL (ref 0.450–4.500)

## 2017-12-19 LAB — LIPID PANEL
Chol/HDL Ratio: 3.3 ratio (ref 0.0–5.0)
Cholesterol, Total: 166 mg/dL (ref 100–199)
HDL: 51 mg/dL (ref >39)
LDL Calculated: 104 mg/dL — ABNORMAL HIGH (ref 0–99)
Triglycerides: 54 mg/dL (ref 0–149)
VLDL Cholesterol Cal: 11 mg/dL (ref 5–40)

## 2017-12-19 LAB — VITAMIN D 25 HYDROXY (VIT D DEFICIENCY, FRACTURES): Vit D, 25-Hydroxy: 38.4 ng/mL (ref 30.0–100.0)

## 2017-12-20 LAB — FECAL OCCULT BLOOD, IMMUNOCHEMICAL: Fecal Occult Bld: NEGATIVE

## 2017-12-25 ENCOUNTER — Ambulatory Visit: Payer: Medicare Other | Admitting: Internal Medicine

## 2017-12-25 ENCOUNTER — Other Ambulatory Visit: Payer: Self-pay

## 2017-12-25 ENCOUNTER — Encounter: Payer: Self-pay | Admitting: Internal Medicine

## 2017-12-25 ENCOUNTER — Other Ambulatory Visit: Payer: Medicare Other

## 2017-12-25 VITALS — BP 110/70 | HR 75 | Temp 97.9°F | Ht 63.0 in | Wt 149.6 lb

## 2017-12-25 DIAGNOSIS — J471 Bronchiectasis with (acute) exacerbation: Secondary | ICD-10-CM

## 2017-12-25 DIAGNOSIS — R9389 Abnormal findings on diagnostic imaging of other specified body structures: Secondary | ICD-10-CM | POA: Diagnosis not present

## 2017-12-25 DIAGNOSIS — R05 Cough: Secondary | ICD-10-CM

## 2017-12-25 DIAGNOSIS — J449 Chronic obstructive pulmonary disease, unspecified: Secondary | ICD-10-CM | POA: Diagnosis not present

## 2017-12-25 DIAGNOSIS — R053 Chronic cough: Secondary | ICD-10-CM

## 2017-12-25 NOTE — Patient Instructions (Addendum)
Mucinex is 1200 mg every 12  hours with flutter valve as much as possible   Keep appt for the CT scans and try to hold off on taking  the levaquin until after the scans on 12/27/17 and I will call you with results   Please remember to go to the lab department downstairs in the basement  for your tests - we will call you with the results when they are available.   Please schedule a follow up office visit in 6 weeks, call sooner if needed with all medications /inhalers/ solutions in hand so we can verify exactly what you are taking. This includes all medications from all doctors and over the [place] separate them into two bags:  the ones you take automatically, no matter what, vs the ones you take just when you feel you need them "BAG #2 is UP TO YOU"  - this will really help [place] help you take your medications more effectively.

## 2017-12-25 NOTE — Progress Notes (Signed)
Subjective:   Patient ID: [name], male    DOB: [birth year]   MRN: [id number]    Brief patient profile:  84   yowm quit smoking completely around 1980 (relatively light) with cough and short of breath eval by  Dr Eliberto Ivory  Then [name] dx of copd/ cb  pft's showed fev1 116% 02/2010 though ratio 66 c/w GOLD I criteria    History of Present Illness  07/05/2010 ov cc recurrent pna's since June 2011 cc not back to baseline in terms of activities he enjoyed in May, for example  Could do some yardwork  and rarely  Needed saba and no need for any maint rx,   but on  spriva for sob and still frequent tightness generalized front more than back,  Equal both sides  low grade fever and mucus gets thick and yellow> admit to [facility] April 24-27 by [facility] dx of ? Pna.   No sob at rest.   Continue protonix 40 mg  Take 30-60 min before first meal of the day and Pepcid 20 mg at bedtime GERD (REFLUX) diet Try symbicort 160 Take 2 puffs bid and work on hfa technique    08/24/2010 ov/Hezzie Karim/[name] cc breathing and cough better despite L rib fx 07/22/10. Main concern is how to prevent future exacerbations.  No limiting sob but not as active since quit smoking. Doing so much better overall rec  Ok to stop spiriva unless it reduces your desired activity performance (it's like high octane fuel for your lungs so your can be all you can be) Continue symbicort 160 Take 2 puffs first thing in am and then another 2 puffs about 12 hours later.   In event of recurrent signs of infection:  Change in sputum, fever, cough, short of breath or sore throat go ahead and take a course of doxycycline - if this doesn't help we need to see you in [place] (Arantza Darrington/ [name]) F/u prn      01/31/2017  f/u ov/Lavonne Cass re:  Copd 1/ chronic bronchitis / ? Prev aspiraton with abnormal cT chest  ? obst RLL  Chief Complaint  Patient presents with  . Follow-up    Pt states he has had "a touch of PNA". He has been coughing since the beginning on Nov  2018.  He has taken doxy and levaquin.  He is coughing up greyish white sputum.  He has an albuterol inhaler that he rarely uses.   swallowing better now but still very congested cough esp since early nov 2018  Not using flutter valve as rec  hfa has improved but not yet adequate Still some hb/ sore throat symptoms despite reporting ppi bid  Also still some noct cough sev hours p lie down despite using 6 inch blocks rec FOB 02/01/17> tenacious secretions> nl flora/ no tb, no fungus on culture    04/28/2017  f/u ov/Oprah Camarena re: f/u COPD / cb abn ct again Chief Complaint  Patient presents with  . Acute Visit    Increased cough x 10 days- prod with white to grey sputum. He started on Doxy per Dr Laurance Flatten- has one dose remaining. He states his breathing is unchanged since his last visit. He rarely uses his albuterol inhaler.   Had CT Chest done 04/26/17.    did better p first few days p fob until the end Dec nasty mucus, more sob > doxy then added zpak then better again then worse again last week if Feb 2019 restarted doxy and now  on day #  9/10 on  ct chest 04/26/17 obst again on w/u for TAA Doe = MMRC1 = can walk nl pace, flat grade, can't hurry or go uphills or steps s sob   Sleeps ok but quit a bit of am congestion /mucus still grey   rec Please see patient coordinator before you leave today  to schedule sinus CT> neg  For nasty mucus >  zpak  Prednisone 10 mg take  4 each am x 2 days,   2 each am x 2 days,  1 each am x 2 days and stop  Work on inhaler technique:        07/31/2017  f/u ov/Desira Alessandrini re:  [place] /AB  Chief Complaint  Patient presents with  . Follow-up    Breathing is unchanged since last OV. Reports DOE, chest tightness, wheezing and coughing.  Dyspnea:  MMRC2 = can't walk a nl pace on a flat grade s sob but does fine slow and flat eg  Cough: worse at bedtime and improved p levaquin 250 x 10 days completed 07/17/17   Sleep: cough does wake him up  SABA use:  Not using  rec Stop  singulair Work on inhaler technique:  For cough > mucinex or mucinex dm up to 1200 mg every 12 hours as needed the flutter valve as much as possible For nasty mucus >  levaquin 500 mg daily x 7 days ok to refill     11/27/2017 acute extended ov/Paulena Servais re: acute chills, low grade fever, mucus changing to darker  Chief Complaint  Patient presents with  . Acute Visit    started having chills and low grade temp 1 day ago. He has sweats last night.    was great p last course of levaquin with noct cough mucoid turned darker 4 days  prior to OV  Then acute onset chills one day prio but no change chronic doe/ comfortable at rest rec For nasty mucus /fever >  Take levaquin 500 mg daily x 10 days For cough > mucinex or mucinex up to 1200 mg every 12 hour and use the flutter valve as much as possible Plan A = Automatic = Symbicort 80 Take 2 puffs first thing in am and then another 2 puffs about 12 hours later.  Work on inhaler technique:   Plan B = Backup Only use your albuterol (proventil) as a rescue medication  Plan C = Crisis - only use your albuterol(xopenex)  nebulizer if you first try Plan B and it fails to help > ok to use the nebulizer up to every 4 hours but if start needing it regularly call for immediate appointment Keep previous appt but bring all inhalers/meds with you at that time    12/25/2017 acute ov/Analeese Andreatta re: bronchiectasis flare , last levaquin was 12/20/17  / maint on smyb 160 Chief Complaint  Patient presents with  . Acute Visit    started running fever 12/24/17. He also has had chest congestion and cough with dark brown sputum. He rarely uses his albuterol inhaler or neb.   Dyspnea:  MMRC3 = can't walk 100 yards even at a slow pace at a flat grade s stopping due to sob   Cough: worse one hour p hs with lots of yellow mucus esp in am / assoc overt HB Sleeping: 6 inch hob  SABA use: only once a day   No obvious day to day or daytime variability or assoc   mucus plugs  or  hemoptysis or cp or chest tightness, subjective wheeze or overt sinus symptoms.    Also denies any obvious fluctuation of symptoms with weather or environmental changes or other aggravating or alleviating factors except as outlined above   No unusual exposure hx or h/o childhood pna/ asthma or knowledge of premature birth.  Current Allergies, Complete Past Medical History, Past Surgical History, Family History, and Social History were reviewed in [employer] record.  ROS  The following are not active complaints unless bolded Hoarseness, sore throat, dysphagia, dental problems, itching, sneezing,  nasal congestion or discharge of excess mucus or purulent secretions, ear ache,   fever, chills, sweats, unintended wt loss or wt gain, classically pleuritic or exertional cp,  orthopnea pnd or arm/hand swelling  or leg swelling, presyncope, palpitations, abdominal pain, anorexia, nausea, vomiting, diarrhea  or change in bowel habits or change in bladder habits, change in stools or change in urine, dysuria, hematuria,  rash, arthralgias, visual complaints, headache, numbness, weakness or ataxia or problems with walking or coordination,  change in mood or  memory.        Current Meds  Medication Sig  . albuterol (PROVENTIL HFA;VENTOLIN HFA) 108 (90 Base) MCG/ACT inhaler Inhale 2 puffs into the lungs every 6 (six) hours as needed for wheezing or shortness of breath.  . bisacodyl (DULCOLAX) 10 MG suppository Place 10 mg rectally as needed for moderate constipation.  . bromocriptine (PARLODEL) 2.5 MG tablet Take 2.5 mg by mouth 2 (two) times daily.  . budesonide-formoterol (SYMBICORT) 160-4.5 MCG/ACT inhaler Inhale 2 puffs into the lungs 2 (two) times daily.  . busPIRone (BUSPAR) 15 MG tablet Take 1 tablet (15 mg total) by mouth 2 (two) times daily as needed.  . calcium carbonate (OSCAL) 1500 (600 Ca) MG TABS tablet Take 600 mg of elemental calcium by mouth daily with breakfast.   .  cholecalciferol (VITAMIN D) 1000 UNITS tablet Take 1,000 Units by mouth daily.   Marland Kitchen diltiazem (CARDIZEM CD) 120 MG 24 hr capsule TAKE (1) CAPSULE DAILY  . ferrous sulfate 325 (65 FE) MG tablet Take 325 mg by mouth daily with breakfast.   . guaiFENesin (MUCINEX) 600 MG 12 hr tablet Take 600 mg by mouth daily.   Marland Kitchen levalbuterol (XOPENEX) 0.63 MG/3ML nebulizer solution Take 3 mLs (0.63 mg total) by nebulization every 6 (six) hours as needed for wheezing or shortness of breath.  . levothyroxine (SYNTHROID, LEVOTHROID) 50 MCG tablet TAKE 1 TABLET IN MORNING  . magnesium oxide (MAG-OX) 400 (241.3 Mg) MG tablet Take 400 mg by mouth every other day.  . naphazoline-pheniramine (NAPHCON-A) 0.025-0.3 % ophthalmic solution Place 1 drop into both eyes 2 (two) times daily as needed for irritation or allergies.   . nitroGLYCERIN (NITROSTAT) 0.4 MG SL tablet Place 1 tablet (0.4 mg total) under the tongue every 5 (five) minutes as needed for chest pain.  . pantoprazole (PROTONIX) 40 MG tablet TAKE (1) TABLET TWICE A DAY BEFORE MEALS.  Marland Kitchen polyethylene glycol (MIRALAX / GLYCOLAX) packet Take 17 g by mouth daily. (Patient taking differently: Take 17 g by mouth daily as needed for moderate constipation. )  . Respiratory Therapy Supplies (FLUTTER) DEVI 1 Device by Does not apply route as needed.  . sucralfate (CARAFATE) 1 g tablet TAKE (1) TABLET FOUR TIMES DAILY AS NEEDED FOR GI UPSET  . vitamin B-12 (CYANOCOBALAMIN) 1000 MCG tablet Take 1,000 mcg by mouth daily.  . vitamin E (VITAMIN E) 400 UNIT capsule Take 400 Units by  mouth daily.               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 Bonchiectasis RLL- CT 2009 > not verified on CT 07/22/10     Past Surgical History:  Inguinal herniorrhaphy  PTCA/stent  LEFT & RIGHT FOOT RECONSTRUCTION  hiatal hernia repair 2009   Family History:  neg for pituitary dz  mother died age 55 from phlebitis  father died age 52 from heart failure  8 siblings  alive age 49,80,89,92  1 died age 31 heart failure  3 died age 73,76,90 from heart problems    Social History:  married  2 children  retiired  never smoked  no etoh         Objective:   Physical Exam   wt 167 07/05/2010  > 168 08/24/2010 > 04/05/2011 174> 07/12/2011  176> 09/24/2012  169 > 02/25/2013  175 >  01/31/2017   150 > 04/28/2017   156 > 07/31/2017    155  > 10/31/2017  149 > 11/13/2017   154 > 11/27/2017  146 > 12/25/2017   149     06/13/16 153 lb 12.8 oz (69.8 kg)  05/10/16 155 lb (70.3 kg)  05/06/16 154 lb (69.9 kg)     Vital signs reviewed - Note on arrival 02 sats  95% on RA     HEENT: nl dentition, turbinates bilaterally, and oropharynx. Nl external ear canals without cough reflex   NECK :  without JVD/Nodes/TM/ nl carotid upstrokes bilaterally   LUNGS: no acc muscle use,  Nl contour chest junky insp/exp rhonchi bilaterally without cough on insp or exp maneuvers   CV:  RRR  no s3 or murmur or increase in P2, and no edema   ABD:  soft and nontender with nl inspiratory excursion in the supine position. No bruits or organomegaly appreciated, bowel sounds nl  MS:  Nl gait/ ext warm without deformities, calf tenderness, cyanosis or clubbing No obvious joint restrictions   SKIN: warm and dry without lesions    NEURO:  alert, approp, nl sensorium with  no motor or cerebellar deficits apparent.            Labs ordered 12/25/2017  Sputum culture    Assessment & Plan:

## 2017-12-25 NOTE — Telephone Encounter (Signed)
Spoke with pt, scheduled to see MW today at 11:45.  Nothing further needed at this time.

## 2017-12-26 ENCOUNTER — Ambulatory Visit (INDEPENDENT_AMBULATORY_CARE_PROVIDER_SITE_OTHER): Payer: Medicare Other | Admitting: *Deleted

## 2017-12-26 VITALS — BP 112/66 | HR 73 | Ht 63.5 in | Wt 151.0 lb

## 2017-12-26 DIAGNOSIS — Z23 Encounter for immunization: Secondary | ICD-10-CM

## 2017-12-26 DIAGNOSIS — Z0000 Encounter for general adult medical examination without abnormal findings: Secondary | ICD-10-CM | POA: Diagnosis not present

## 2017-12-26 NOTE — Patient Instructions (Signed)
  [name] , Thank you for taking time to come for your Medicare Wellness Visit. I appreciate your ongoing commitment to your health goals. Please review the following plan we discussed and let me know if I can assist you in the future.   These are the goals we discussed: Goals    . Exercise 150 min/wk Moderate Activity     Check with physical therapy to see what activities you can safely do.        This is a list of the screening recommended for you and due dates:  Health Maintenance  Topic Date Due  . Flu Shot  06/20/2018*  . Tetanus Vaccine  06/28/2020  . Pneumonia vaccines  Completed  *Topic was postponed. The date shown is not the original due date.

## 2017-12-27 ENCOUNTER — Encounter: Payer: Self-pay | Admitting: *Deleted

## 2017-12-27 ENCOUNTER — Ambulatory Visit (HOSPITAL_COMMUNITY)
Admission: RE | Admit: 2017-12-27 | Discharge: 2017-12-27 | Disposition: A | Discharge status: Home / Self Care | Payer: Medicare Other | Source: Ambulatory Visit | Attending: Internal Medicine | Admitting: Internal Medicine

## 2017-12-27 DIAGNOSIS — R05 Cough: Secondary | ICD-10-CM | POA: Insufficient documentation

## 2017-12-27 DIAGNOSIS — K449 Diaphragmatic hernia without obstruction or gangrene: Secondary | ICD-10-CM | POA: Diagnosis not present

## 2017-12-27 DIAGNOSIS — M4854XA Collapsed vertebra, not elsewhere classified, thoracic region, initial encounter for fracture: Secondary | ICD-10-CM | POA: Insufficient documentation

## 2017-12-27 DIAGNOSIS — J45909 Unspecified asthma, uncomplicated: Secondary | ICD-10-CM | POA: Diagnosis not present

## 2017-12-27 DIAGNOSIS — J90 Pleural effusion, not elsewhere classified: Secondary | ICD-10-CM | POA: Diagnosis not present

## 2017-12-27 DIAGNOSIS — I2510 Atherosclerotic heart disease of native coronary artery without angina pectoris: Secondary | ICD-10-CM | POA: Insufficient documentation

## 2017-12-27 DIAGNOSIS — Z23 Encounter for immunization: Secondary | ICD-10-CM

## 2017-12-27 DIAGNOSIS — Z0000 Encounter for general adult medical examination without abnormal findings: Secondary | ICD-10-CM | POA: Diagnosis not present

## 2017-12-27 DIAGNOSIS — I712 Thoracic aortic aneurysm, without rupture: Secondary | ICD-10-CM | POA: Insufficient documentation

## 2017-12-27 DIAGNOSIS — R059 Cough, unspecified: Secondary | ICD-10-CM

## 2017-12-27 NOTE — Progress Notes (Addendum)
Subjective:   [name] is a 84 y.o. male who presents for a Medicare Annual Wellness Visit. [name] lives at home alone since his wife passed away two years ago. He has a lady friend that he spends some time with. She is a retired Marine scientist and accompanies him to some of his appointments. He has one daughter and one son and quadruplet grandchildren. 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 He used to play the guitar regularly but doesn't now due to arthritis. He really enjoys writing and uses his computer regularly to share his thoughts with friends. He has a rescue dog that he really enjoys spending time with.   Review of Systems    Patient reports that his overall health is unchanged compared to last year.  Cardiac Risk Factors include: advanced age (>26men, >18 women);male gender;smoking/ tobacco exposure;hypertension  Psych:   All other systems negative       Current Medications (verified) Outpatient Encounter Medications as of 12/26/2017  Medication Sig  . albuterol (PROVENTIL HFA;VENTOLIN HFA) 108 (90 Base) MCG/ACT inhaler Inhale 2 puffs into the lungs every 6 (six) hours as needed for wheezing or shortness of breath.  . bisacodyl (DULCOLAX) 10 MG suppository Place 10 mg rectally as needed for moderate constipation.  . bromocriptine (PARLODEL) 2.5 MG tablet Take 2.5 mg by mouth 2 (two) times daily.  . budesonide-formoterol (SYMBICORT) 160-4.5 MCG/ACT inhaler Inhale 2 puffs into the lungs 2 (two) times daily.  . busPIRone (BUSPAR) 15 MG tablet Take 1 tablet (15 mg total) by mouth 2 (two) times daily as needed.  . calcium carbonate (OSCAL) 1500 (600 Ca) MG TABS tablet Take 600 mg of elemental calcium by mouth daily with breakfast.   . cholecalciferol (VITAMIN D) 1000 UNITS tablet Take 1,000 Units by mouth daily.   Marland Kitchen diltiazem (CARDIZEM CD) 120 MG 24 hr capsule TAKE (1) CAPSULE DAILY  . ferrous sulfate 325 (65 FE) MG tablet Take 325 mg by mouth daily with  breakfast.   . guaiFENesin (MUCINEX) 600 MG 12 hr tablet Take 600 mg by mouth daily.   Marland Kitchen levalbuterol (XOPENEX) 0.63 MG/3ML nebulizer solution Take 3 mLs (0.63 mg total) by nebulization every 6 (six) hours as needed for wheezing or shortness of breath.  . levothyroxine (SYNTHROID, LEVOTHROID) 50 MCG tablet TAKE 1 TABLET IN MORNING  . magnesium oxide (MAG-OX) 400 (241.3 Mg) MG tablet Take 400 mg by mouth every other day.  . naphazoline-pheniramine (NAPHCON-A) 0.025-0.3 % ophthalmic solution Place 1 drop into both eyes 2 (two) times daily as needed for irritation or allergies.   . nitroGLYCERIN (NITROSTAT) 0.4 MG SL tablet Place 1 tablet (0.4 mg total) under the tongue every 5 (five) minutes as needed for chest pain.  . pantoprazole (PROTONIX) 40 MG tablet TAKE (1) TABLET TWICE A DAY BEFORE MEALS.  Marland Kitchen polyethylene glycol (MIRALAX / GLYCOLAX) packet Take 17 g by mouth daily. (Patient taking differently: Take 17 g by mouth daily as needed for moderate constipation. )  . Respiratory Therapy Supplies (FLUTTER) DEVI 1 Device by Does not apply route as needed.  . sucralfate (CARAFATE) 1 g tablet TAKE (1) TABLET FOUR TIMES DAILY AS NEEDED FOR GI UPSET  . vitamin B-12 (CYANOCOBALAMIN) 1000 MCG tablet Take 1,000 mcg by mouth daily.  . vitamin E (VITAMIN E) 400 UNIT capsule Take 400 Units by mouth daily.   No facility-administered encounter medications on file as of 12/26/2017.     Allergies (verified) Betadine [povidone iodine]; Lortab [hydrocodone-acetaminophen];  Penicillins; Zetia [ezetimibe]; and Zocor [simvastatin]   History: Past Medical History:  Diagnosis Date  . Abnormality of gait 06/07/2013  . Anemia   . Aneurysm ([place])    on assending aorta, currently watching it, Dr Cyndia Bent  . Anxiety   . Arthritis   . Asthma   . Atrial fibrillation ([place])   . Barrett's esophagus   . CAD (coronary artery disease) 03/2006   3.0 x 20 mm TAXUS Perseus DES to the LAD; 01/2007  L main 30%, oLAD 50%, pLAD  stent ok, CFX 80%, OM 60%, pRCA 60%, mRCA 70%, oPDA 90%; med rx   . Cancer ([place])    skin cancer   . Cataract    bilateral removal of cateracts  . Complication of anesthesia     no issues,but pt prefers spinal due to Pulmonary problems  . COPD (chronic obstructive pulmonary disease) ([place])    oxygen  on standby in home.  . Depression   . Diverticulosis   . Emphysema   . Enlarged prostate with urinary retention   . Esophageal stenosis   . GERD (gastroesophageal reflux disease)   . Hiatal hernia   . Hypothyroidism   . Neuropathy   . Osteoporosis   . Pituitary macroadenoma ([place])   . Restless legs syndrome (RLS)   . UGIB (upper gastrointestinal bleed) 03/2013   EGD w/ large ulcer at GE junction   Past Surgical History:  Procedure Laterality Date  . ABDOMINAL HERNIA REPAIR   2008  . CARDIAC CATHETERIZATION  01/2007   L main 30%, oLAD 50%,  pLAD stent ok, CFX 80%, OM 60%, pRCA 60%, mRCA 70%, oPDA 90%; med rx  . cataract extraction both eyes    . COLONOSCOPY    . CORONARY STENT PLACEMENT  03/2006   3.0 x 20 mm TAXUS Perseus DES to the LAD  . CYSTOSCOPY N/A 06/10/2015   Procedure: CYSTOSCOPY FULGRATION OF BLEEDING,  electovapor resection;  Surgeon: Irine Seal, MD;  Location: [facility];  Service: Urology;  Laterality: N/A;  . CYSTOSCOPY WITH INSERTION OF UROLIFT N/A 06/01/2015   Procedure: CYSTOSCOPY WITH INSERTION OF UROLIFT x4;  Surgeon: Irine Seal, MD;  [facility];  Service: Urology;  Laterality: N/A;  . ESOPHAGOGASTRODUODENOSCOPY N/A 04/20/2013   Procedure: ESOPHAGOGASTRODUODENOSCOPY (EGD);  Surgeon: Inda Castle, MD;  [facility];  Service: Endoscopy;  Laterality: N/A;  . ESOPHAGOGASTRODUODENOSCOPY N/A 03/25/2016   Procedure: ESOPHAGOGASTRODUODENOSCOPY (EGD);  Surgeon: Irene Shipper, MD;  Location: [facility];  Service: Endoscopy;  Laterality: N/A;  . ESOPHAGOGASTRODUODENOSCOPY (EGD) WITH PROPOFOL N/A 10/13/2015   Procedure: ESOPHAGOGASTRODUODENOSCOPY (EGD) WITH  PROPOFOL;  Surgeon: Jerene Bears, MD;  [facility];  Service: Gastroenterology;  Laterality: N/A;  . FOOT SURGERY  1994 left, 2002 right foot   bilateral foot reconstruciton  . FOOT SURGERY     reconstruction of both feet- no retained hardware.  Marland Kitchen HIATAL HERNIA REPAIR  01-04-2008  . MELANOMA EXCISION  2019  . POLYPECTOMY    . PROSTATE SURGERY     x 2  . SAVORY DILATION N/A 03/25/2016   Procedure: SAVORY DILATION;  Surgeon: Irene Shipper, MD;  [facility];  Service: Endoscopy;  Laterality: N/A;  . TOTAL HIP ARTHROPLASTY  07/21/2011   Procedure: TOTAL HIP ARTHROPLASTY ANTERIOR APPROACH;  Surgeon: Mauri Pole, MD;  [facility];  Service: Orthopedics;  Laterality: Left;  . TOTAL HIP ARTHROPLASTY Right 09/07/2012   Procedure: RIGHT TOTAL HIP ARTHROPLASTY ANTERIOR APPROACH;  Surgeon: Lind Guest  Ninfa Linden, MD;  Location[facility];  Service: Orthopedics;  Laterality: Right;  . TRANSURETHRAL RESECTION OF BLADDER NECK N/A 11/04/2015   Procedure: RESECTION OF BLADDER NECK;  Surgeon: Cleon Gustin, MD;  Location: [facility];  Service: Urology;  Laterality: N/A;  . TRANSURETHRAL RESECTION OF PROSTATE N/A 11/04/2015   Procedure: TRANSURETHRAL RESECTION OF THE PROSTATE (TURP); REMOVAL OF UROLIFT IMPLANTS X THREE;  Surgeon: Cleon Gustin, MD;  Location: [facility];  Service: Urology;  Laterality: N/A;  . UPPER GASTROINTESTINAL ENDOSCOPY  12/2013   Dr Hilarie Fredrickson, gastritis  . VIDEO BRONCHOSCOPY Bilateral 02/01/2017   Procedure: VIDEO BRONCHOSCOPY WITHOUT FLUORO;  Surgeon: Tanda Rockers, MD;  [facility];  Service: Cardiopulmonary;  Laterality: Bilateral;   Family History  Problem Relation Age of Onset  . Heart disease Sister   . Heart failure Father   . Other Mother        phebitis related to Ric's birth, he was 46 weeks old when she died  . Colon cancer Neg Hx   . Esophageal cancer Neg Hx   . Rectal cancer Neg Hx   . Stomach cancer Neg Hx   . Colon polyps Neg Hx     Social History   Socioeconomic History  . Marital status: Widowed    Spouse name: Not on file  . Number of children: 2  . Years of education: 55  . Highest education level: Associate degree: occupational, Hotel manager, or vocational program  Occupational History  . Occupation: retired  Scientific laboratory technician  . Financial resource strain: Not hard at all  . Food insecurity:    Worry: Never true    Inability: Never true  . Transportation needs:    Medical: No    Non-medical: No  Tobacco Use  . Smoking status: Never Smoker  . Smokeless tobacco: Never Used  Substance and Sexual Activity  . Alcohol use: No  . Drug use: No  . Sexual activity: Not Currently    Birth control/protection: None  Lifestyle  . Physical activity:    Days per week: 0 days    Minutes per session: 0 min  . Stress: To some extent  Relationships  . Social connections:    Talks on phone: More than three times a week    Gets together: More than three times a week    Attends religious service: More than 4 times per year    Active member of club or organization: Yes    Attends meetings of clubs or organizations: More than 4 times per year    Relationship status: Widowed  Other Topics Concern  . Not on file  Social History Narrative   Patient drinks caffeine a few times a week.   Patient is right handed.    Tobacco Use No.  Clinical Intake:  Pre-visit preparation completed: No  Pain : No/denies pain     Nutritional Status: BMI 25 -29 Overweight Nutritional Risks: (Eats 3 meals a day. Prepares some quick meals at home. He does eat out often and usually brings half of it back with him. He does enjoy baking. He doesn't drink a lot of water but tries tos ip on it throughout the day. ) Diabetes: No  How often do you need to have someone help you when you read instructions, pamphlets, or other written materials from your doctor or pharmacy?: 1 - Never What is the last grade level you completed in school?:  [employer] academy and training for work  Interpreter Needed?: No  Information entered by :: Chong Sicilian, RN  Activities of Daily Living In your present state of health, do you have any difficulty performing the following activities: 12/26/2017  Hearing? N  Vision? N  Difficulty concentrating or making decisions? N  Walking or climbing stairs? N  Dressing or bathing? N  Doing errands, shopping? N  Preparing Food and eating ? N  Using the Toilet? N  In the past six months, have you accidently leaked urine? N  Do you have problems with loss of bowel control? N  Managing your Medications? N  Managing your Finances? N  Housekeeping or managing your Housekeeping? N  Some recent data might be hidden     Diet Eats out and brings half home. Enjoys baking. Cooks easy things at home. Typically eats 3 meals and drinks water that he sips on throughout the day. He doesn't get thirsty.   Exercise Current Exercise Habits: The patient does not participate in regular exercise at present(He is able to workout at the physical therapy office next door free of charge although he hasn't taken advantage of that recently. He states that he will talk with the phsyical therapists about what he can do safely. ), Exercise limited by: orthopedic condition(s)    Depression Screen PHQ 2/9 Scores 12/26/2017 12/15/2017 09/25/2017 09/20/2017  PHQ - 2 Score 1 1 0 0  PHQ- 9 Score - - - -     Fall Risk Fall Risk  12/26/2017 12/15/2017 09/25/2017 09/20/2017 09/04/2017  Falls in the past year? No No No Yes Yes  Number falls in past yr: - - - 1 2 or more  Injury with Fall? - - - - -  Risk Factor Category  - - - - -  Risk for fall due to : Other (Comment);Impaired balance/gait - - - -  Risk for fall due to: Comment He has basement steps that he uses several times a day. He does have handrails on each side. He also  - - - -  Follow up - - - - -    Safety Is the patient's home free of loose throw rugs in walkways,  pet beds, electrical cords, etc?   yes      Grab bars in the bathroom? no      Walkin shower? yes      Shower Seat? yes      Handrails on the stairs?   yes      Adequate lighting?   yes  Patient Care Team: Chipper Herb, MD as PCP - General (Family Medicine) Renato Shin, MD as Consulting Physician (Endocrinology) Glenna Fellows, MD as Attending Physician (Neurosurgery) Tanda Rockers, MD as Consulting Physician (Pulmonary Disease) Gaye Pollack, MD as Consulting Physician (Cardiothoracic Surgery)  Hospitalizations, surgeries, and ER visits in previous 12 months No hospitalizations, ER visits, or surgeries this past year.   Objective:    Today's Vitals   12/26/17 1129  BP: 112/66  Pulse: 73  Weight: 151 lb (68.5 kg)  Height: 5' 3.5" (1.613 m)   Body mass index is 26.33 kg/m.  Advanced Directives 12/26/2017 08/16/2017 02/01/2017 06/28/2016 04/26/2016 03/25/2016 03/23/2016  Does Patient Have a Medical Advance Directive? Yes Yes Yes Yes Yes Yes No  Type of Paramedic of [place];Living will [place];Living will Living will;Healthcare Power of [place];Living will [place];Living will [place];Living will -  Does patient want to make changes to medical advance directive?  No - Patient declined - - - Yes (Inpatient - patient requests chaplain consult to change a medical advance directive) - -  Copy of [place] in Chart? Yes - - No - copy requested - Yes -  Would patient like information on creating a medical advance directive? - - - - - - No - Patient declined  Pre-existing out of facility DNR order (yellow form or pink MOST form) - - - - - - -    Hearing/Vision  No hearing or vision deficits noted during visit.   Cognitive Function:    6CIT Screen 12/26/2017  What Year? 0 points  What month? 0 points  What time? 0 points  Count back from 20 0  points  Months in reverse 0 points  Repeat phrase 0 points  Total Score 0   Normal Cognitive Function Screening: Yes    Immunizations and Health Maintenance Immunization History  Administered Date(s) Administered  . Influenza Whole 11/22/2007, 10/29/2009, 10/30/2010, 11/29/2011  . Influenza, High Dose Seasonal PF 12/03/2015, 12/26/2016, 12/27/2017  . Influenza,inj,Quad PF,6+ Mos 12/03/2012, 11/25/2013, 12/19/2014  . Pneumococcal Conjugate-13 01/09/2013  . Pneumococcal Polysaccharide-23 10/29/2009  . Td 04/29/1998  . Zoster 03/31/2006   There are no preventive care reminders to display for this patient.   Assessment:   This is a routine wellness examination for [facility].      Plan:    Goals    . Exercise 150 min/wk Moderate Activity     Check with physical therapy to see what activities you can safely do.         Health Maintenance Recommendations: Influenza  Additional Screening Recommendations: Lung: Low Dose CT Chest recommended if Age 8-80 years, 30 pack-year currently smoking OR have quit w/in 15years. Patient does not qualify. Hepatitis C Screening recommended: no  Today's Orders Orders Placed This Encounter  Procedures  . Flu vaccine HIGH DOSE PF    Keep f/u with Chipper Herb, MD and any other specialty appointments you may have Continue current medications Move carefully to avoid falls. Use assistive devices like a cane or walker if needed. Aim for at least 150 minutes of moderate activity a week. This can be chair exercises if necessary. Reading or puzzles are a good way to exercise your brain Stay connected with friends and family. Social connections are beneficial to your emotional and mental health.   I have personally reviewed and noted the following in the patient's chart:   . Medical and social history . Use of alcohol, tobacco or illicit drugs  . Current medications and supplements . Functional ability and status . Nutritional  status . Physical activity . Advanced directives . List of other physicians . Hospitalizations, surgeries, and ER visits in previous 12 months . Vitals . Screenings to include cognitive, depression, and falls . Referrals and appointments  In addition, I have reviewed and discussed with patient certain preventive protocols, quality metrics, and best practice recommendations. A written personalized care plan for preventive services as well as general preventive health recommendations were provided to patient.     Chong Sicilian, RN   12/26/2017   I have reviewed and agree with the above AWV documentation.   Mary-Margaret Hassell Done, FNP

## 2017-12-28 ENCOUNTER — Encounter: Payer: Self-pay | Admitting: Internal Medicine

## 2017-12-28 ENCOUNTER — Telehealth: Payer: Self-pay | Admitting: Internal Medicine

## 2017-12-28 DIAGNOSIS — J471 Bronchiectasis with (acute) exacerbation: Secondary | ICD-10-CM

## 2017-12-28 LAB — RESPIRATORY CULTURE OR RESPIRATORY AND SPUTUM CULTURE
MICRO NUMBER:: 91293628
RESULT:: NORMAL
SPECIMEN QUALITY:: ADEQUATE

## 2017-12-28 NOTE — Assessment & Plan Note (Signed)
CT w/o contrast Chest  05/20/16 1. Some clearing of peripheral consolidation in the right lower lobe. There remains slight dilatation of right lower lobe bronchi with intraluminal material in the right lower lobe bronchi. There remains slightly nodular appearing infiltrates within the right lower lobe. 2. Resolving inflammatory foci in the right middle lobe. 3. Stable 5 mm left lower lobe subpleural nodule. 4. Aneurysmal dilatation of the ascending aorta up to 4.4 cm.    Flutter valve added to rx 06/13/2016 > not using 01/31/2017 Ct chest 11/21/16 Re- demonstrated material within the right lower lobe bronchi most suggestive of aspirated material. Slight interval increase in subpleural consolidation within the medial right lower lobe which may represent combination of aspiration, infection and/or scarring FOB 02/01/17> tenacious secretions> nl flora/ no tb, no fungus on culture  Repeat CT 04/26/17  Mildly progressive chronic inflammatory/infectious changes in the lower lobe with mucous plugging. - Sinus CT 05/09/17 min sphenoidal dz  - DgEs  07/31/2017 >>> Tight distal esophageal stricture, likely at the esophagogastric junction in the setting of a tiny hiatal hernia status post fundoplication. It is possible that this stricture is in the distal esophagus and that there is a patulous ampulla of the distal esophagus rather than a tiny hiatal hernia.  Prominent cricopharyngeus impression on the posterior wall of the proximal esophagus.  No aspiration was observed during the study. A period of observation with the patient supine on the fluoro table reveals no gastroesophageal reflux. - HRCT chest pending  Strongly suspect this is bronchiectasis and will see if we can qualify for vest or consider repeat fob depending on results

## 2017-12-28 NOTE — Assessment & Plan Note (Addendum)
Sputum culture 12/25/2017 > mixed orgs/ lots of wbcs>>>  Nl flora/ afb neg smear -  HRCT 12/27/17  Bronchiectasis, tree-in-bud opacities, extensive airway debris/mucoid impaction and thick parenchymal banding in the mid to lower lungs, most prominent at the right lung base, similar to the 04/26/2017 chest CT study, with mild worsening of these findings in the interval.  Will hold abx this time to see if we can identify specific org and if not resume cycles of levaquin per his action plan and in meantime repeat hrct of chest Add 01/03/2018 :  Since ov we have established he has bronchiectactasis and has already tried flutter valve and continues to have difficulty mobilizing secretions and suffers as a result is having freq lower respiratory infections/ bronchiectasis exacerbations so the next step is the VEST > ordered

## 2017-12-28 NOTE — Progress Notes (Signed)
LMTCB

## 2017-12-28 NOTE — Assessment & Plan Note (Signed)
-   Spirometry 03/30/2010 FEV1  2.86 (116%) ratio 66  - FOB 02/01/2017  Copious retained secretions - Flutter valve re-training 04/28/2017  - PFT's  07/31/2017  FEV1 2.44 (102 % ) ratio 64  p 12 % improvement from saba p symbicort 160  prior to study with DLCO unable to perform  - 07/31/2017  After extensive coaching inhaler device  effectiveness =    75% from a baseline of 50% - 07/31/2017 d/c singulair and add levquin 500 mg daily x 7 day courses prn purulent sputum 11/06/2017- acute exacerbation with purluent sputum and low grade temp.Tx with levaquin - 11/13/2017    added back symb 80 2bid  - 12/25/2017  After extensive coaching inhaler device,  effectiveness =    90%   Also reviewed flutter/ mucinex rx  But no change in maint rx needed for now

## 2017-12-28 NOTE — Telephone Encounter (Signed)
Notes recorded by Tanda Rockers, MD on 12/28/2017 at 1:29 PM EDT Call patient : Study is C/w bronchiectasis > refer for SMART VEST   Pt is aware of results and voiced his understanding.  Order has been placed to [employer] for smart vest.  Nothing further is needed.

## 2017-12-28 NOTE — Assessment & Plan Note (Addendum)
Sinus CT 05/08/2017 >>>  Min sphenoidal dz  - 07/31/2017 d/c singulair as no evidence helping  - Allergy profile 07/31/2017 >  Eos 0.2 /  IgE  35 RAST neg  - DgES 08/10/2017 >>> No aspiration was observed during the study. A period of observation with the patient supine on the fluoro table reveals no gastroesophageal reflux. - Sinus CT 12/27/17> No evidence of sinusitis   See bronchiectasis a/p   I had an extended discussion with the patient reviewing all relevant studies completed to date and  lasting 15 to 20 minutes of a 25 minute acute office  visit    See device teaching which extended face to face time for this visit   Each maintenance medication was reviewed in detail including most importantly the difference between maintenance and prns and under what circumstances the prns are to be triggered using an action plan format that is not reflected in the computer generated alphabetically organized AVS.     Please see AVS for specific instructions unique to this visit that I personally wrote and verbalized to the the pt in detail and then reviewed with pt  by my nurse highlighting any  changes in therapy recommended at today's visit to their plan of care.

## 2018-01-01 ENCOUNTER — Other Ambulatory Visit: Payer: Self-pay | Admitting: Endocrinology

## 2018-01-01 MED ORDER — LEVOFLOXACIN 500 MG PO TABS: 500.0000 mg | ORAL_TABLET | Freq: Every day | ORAL | 11 refills | Status: DC

## 2018-01-01 NOTE — Telephone Encounter (Signed)
Dr. Melvyn Novas,   Please advise on patients e-mail below. Thank you.

## 2018-01-01 NOTE — Telephone Encounter (Signed)
MW please advise on pt email:  Dr. Melvyn Novas or staff member: Relating to congestion sample that was taken Monday 12-25-17 following an O.V. The tech said that I should receive the results by Thusday or Friday. As confirmation I have not received the results as yet.  I have been working closely with plan A. Also using the "flutter valve" much more frequently or numerous times during the day. Such is giving me more restful nights. Kind regards, Khy Pitre dob 04/09/2032. Ph [phone].

## 2018-01-01 NOTE — Telephone Encounter (Signed)
This rx should be refillable levaquin 500 mg daily x 10 days supply   Refill x 11

## 2018-01-02 ENCOUNTER — Telehealth: Payer: Self-pay

## 2018-01-02 NOTE — Telephone Encounter (Signed)
-----   Message from Gaye Pollack, MD sent at 01/02/2018  2:01 PM EST ----- I looked at scan. It is unchanged at 4.7 cm. He only needs to have it checked once yearly. Radiology always says semiannually on their reports if it is over 4.5 cm. More scans = More $. thx. ----- Message ----- From: Donnella Sham, RN Sent: 12/28/2017  12:16 PM EST To: Gaye Pollack, MD  Patient called and stated he had a CT scan done by Dr. Melvyn Novas yesterday 12/27/17 to look for lung issues.  Patient wanted you to look at the CT scan and see if he needed to come in every 6 months to have his TAA evaluated due to what the "impression" said on the scan.  I explained per your note in 03/2017 that you did not feel it neccessary at this point to do the scan more than once a year.  However, he would not take no for an answer.  Please advise.  Thanks,  Caryl Pina

## 2018-01-02 NOTE — Telephone Encounter (Signed)
Patient contacted and made aware of Dr. Vivi Martens recommendations.

## 2018-01-24 ENCOUNTER — Other Ambulatory Visit: Payer: Self-pay | Admitting: Family Medicine

## 2018-01-24 ENCOUNTER — Other Ambulatory Visit: Payer: Self-pay | Admitting: Internal Medicine

## 2018-01-24 NOTE — Telephone Encounter (Signed)
Will route e mail to Dr. Melvyn Novas

## 2018-01-24 NOTE — Telephone Encounter (Signed)
Fine to use lowest settings for now  This is very unlikely to cause any problem with the aneurysm  Will re-evaluate utility of the vest at f/u ov

## 2018-02-05 ENCOUNTER — Encounter: Payer: Self-pay | Admitting: Internal Medicine

## 2018-02-05 ENCOUNTER — Ambulatory Visit: Payer: Medicare Other | Admitting: Internal Medicine

## 2018-02-05 DIAGNOSIS — J471 Bronchiectasis with (acute) exacerbation: Secondary | ICD-10-CM | POA: Diagnosis not present

## 2018-02-05 DIAGNOSIS — J449 Chronic obstructive pulmonary disease, unspecified: Secondary | ICD-10-CM

## 2018-02-05 NOTE — Patient Instructions (Addendum)
Order was sent to DME company to discontinue your VEST   Please schedule a 3 month followup with Dr Melvyn Novas and call sooner if needed - check alpha one screen next ov plus Ig profile

## 2018-02-06 ENCOUNTER — Encounter: Payer: Self-pay | Admitting: Internal Medicine

## 2018-02-06 NOTE — Progress Notes (Signed)
Subjective:   Patient ID: [name], male    DOB: [birth year]   MRN: [id number]    Brief patient profile:  84   yowm quit smoking completely around 1980 (relatively light) with cough and short of breath eval by  Dr Eliberto Ivory  Then [name] dx of copd/ cb  pft's showed fev1 116% 02/2010 though ratio 66 c/w GOLD I criteria    History of Present Illness  07/05/2010 ov cc recurrent pna's since June 2011 cc not back to baseline in terms of activities he enjoyed in May, for example  Could do some yardwork  and rarely  Needed saba and no need for any maint rx,   but on  spriva for sob and still frequent tightness generalized front more than back,  Equal both sides  low grade fever and mucus gets thick and yellow> admit to [facility] April 24-27 by [facility] dx of ? Pna.   No sob at rest.   Continue protonix 40 mg  Take 30-60 min before first meal of the day and Pepcid 20 mg at bedtime GERD (REFLUX) diet Try symbicort 160 Take 2 puffs bid and work on hfa technique    08/24/2010 ov/Daquarius Dubeau/[name] cc breathing and cough better despite L rib fx 07/22/10. Main concern is how to prevent future exacerbations.  No limiting sob but not as active since quit smoking. Doing so much better overall rec  Ok to stop spiriva unless it reduces your desired activity performance (it's like high octane fuel for your lungs so your can be all you can be) Continue symbicort 160 Take 2 puffs first thing in am and then another 2 puffs about 12 hours later.   In event of recurrent signs of infection:  Change in sputum, fever, cough, short of breath or sore throat go ahead and take a course of doxycycline - if this doesn't help we need to see you in [place] (Gabbi Whetstone/ [name]) F/u prn      01/31/2017  f/u ov/Kamaile Zachow re:  Copd 1/ chronic bronchitis / ? Prev aspiraton with abnormal cT chest  ? obst RLL  Chief Complaint  Patient presents with  . Follow-up    Pt states he has had "a touch of PNA". He has been coughing since the beginning on Nov  2018.  He has taken doxy and levaquin.  He is coughing up greyish white sputum.  He has an albuterol inhaler that he rarely uses.   swallowing better now but still very congested cough esp since early nov 2018  Not using flutter valve as rec  hfa has improved but not yet adequate Still some hb/ sore throat symptoms despite reporting ppi bid  Also still some noct cough sev hours p lie down despite using 6 inch blocks rec FOB 02/01/17> tenacious secretions> nl flora/ no tb, no fungus on culture    04/28/2017  f/u ov/Londan Coplen re: f/u COPD / cb abn ct again Chief Complaint  Patient presents with  . Acute Visit    Increased cough x 10 days- prod with white to grey sputum. He started on Doxy per Dr Laurance Flatten- has one dose remaining. He states his breathing is unchanged since his last visit. He rarely uses his albuterol inhaler.   Had CT Chest done 04/26/17.    did better p first few days p fob until the end Dec nasty mucus, more sob > doxy then added zpak then better again then worse again last week if Feb 2019 restarted doxy and now  on day #  9/10 on  ct chest 04/26/17 obst again on w/u for TAA Doe = MMRC1 = can walk nl pace, flat grade, can't hurry or go uphills or steps s sob   Sleeps ok but quit a bit of am congestion /mucus still grey   rec Please see patient coordinator before you leave today  to schedule sinus CT> neg  For nasty mucus >  zpak  Prednisone 10 mg take  4 each am x 2 days,   2 each am x 2 days,  1 each am x 2 days and stop  Work on inhaler technique:        07/31/2017  f/u ov/Indianna Boran re:  [place] /AB  Chief Complaint  Patient presents with  . Follow-up    Breathing is unchanged since last OV. Reports DOE, chest tightness, wheezing and coughing.  Dyspnea:  MMRC2 = can't walk a nl pace on a flat grade s sob but does fine slow and flat eg  Cough: worse at bedtime and improved p levaquin 250 x 10 days completed 07/17/17   Sleep: cough does wake him up  SABA use:  Not using  rec Stop  singulair Work on inhaler technique:  For cough > mucinex or mucinex dm up to 1200 mg every 12 hours as needed the flutter valve as much as possible For nasty mucus >  levaquin 500 mg daily x 7 days ok to refill     11/27/2017 acute extended ov/Timmi Devora re: acute chills, low grade fever, mucus changing to darker  Chief Complaint  Patient presents with  . Acute Visit    started having chills and low grade temp 1 day ago. He has sweats last night.    was great p last course of levaquin with noct cough mucoid turned darker 4 days  prior to OV  Then acute onset chills one day prio but no change chronic doe/ comfortable at rest rec For nasty mucus /fever >  Take levaquin 500 mg daily x 10 days For cough > mucinex or mucinex up to 1200 mg every 12 hour and use the flutter valve as much as possible Plan A = Automatic = Symbicort 80 Take 2 puffs first thing in am and then another 2 puffs about 12 hours later.  Work on inhaler technique:   Plan B = Backup Only use your albuterol (proventil) as a rescue medication  Plan C = Crisis - only use your albuterol(xopenex)  nebulizer if you first try Plan B and it fails to help > ok to use the nebulizer up to every 4 hours but if start needing it regularly call for immediate appointment Keep previous appt but bring all inhalers/meds with you at that time    12/25/2017 acute ov/Mariajose Mow re: bronchiectasis flare, last levaquin was 12/20/17  / maint on smyb 160 Chief Complaint  Patient presents with  . Acute Visit    started running fever 12/24/17. He also has had chest congestion and cough with dark brown sputum. He rarely uses his albuterol inhaler or neb.   Dyspnea:  MMRC3 = can't walk 100 yards even at a slow pace at a flat grade s stopping due to sob   Cough: worse one hour p hs with lots of yellow mucus esp in am / assoc overt HB Sleeping: 6 inch hob  SABA use: only once a day rec Sputum culture: nl flora/ neg afb smear  Mucinex is 1200 mg every 12  hours with flutter valve as much as possible  HRCT 11/3017 c/w bronchiectasis  VEST rec  02/05/2018  f/u ov/Tristan Bramble re: copd 1, bronchiectasis maint on symb 160 2bid/flutter/vest  Chief Complaint  Patient presents with  . Follow-up   Dyspnea:  No change = mmrc3 Cough: better with approp use of flutter, no better with vest  Sleeping: 6 in blocks, some worse cough / congestion in am but not noct  SABA use: min 02: none     No obvious day to day or daytime variability or assoc excess/ purulent sputum or mucus plugs or hemoptysis or cp or chest tightness, subjective wheeze or overt sinus or hb symptoms.   Sleeping  without nocturnal    exacerbation  of respiratory  c/o's or need for noct saba. Also denies any obvious fluctuation of symptoms with weather or environmental changes or other aggravating or alleviating factors except as outlined above   No unusual exposure hx or h/o childhood pna/ asthma or knowledge of premature birth.  Current Allergies, Complete Past Medical History, Past Surgical History, Family History, and Social History were reviewed in [employer] record.  ROS  The following are not active complaints unless bolded Hoarseness, sore throat, dysphagia, dental problems, itching, sneezing,  nasal congestion or discharge of excess mucus or purulent secretions, ear ache,   fever, chills, sweats, unintended wt loss or wt gain, classically pleuritic or exertional cp,  orthopnea pnd or arm/hand swelling  or leg swelling, presyncope, palpitations, abdominal pain, anorexia, nausea, vomiting, diarrhea  or change in bowel habits or change in bladder habits, change in stools or change in urine, dysuria, hematuria,  rash, arthralgias, visual complaints, headache, numbness, weakness or ataxia or problems with walking or coordination,  change in mood or  memory.        Current Meds  Medication Sig  . albuterol (PROVENTIL HFA;VENTOLIN HFA) 108 (90 Base) MCG/ACT inhaler  Inhale 2 puffs into the lungs every 6 (six) hours as needed for wheezing or shortness of breath.  . bisacodyl (DULCOLAX) 10 MG suppository Place 10 mg rectally as needed for moderate constipation.  . bromocriptine (PARLODEL) 2.5 MG tablet TAKE  (1)  TABLET TWICE A DAY.  . budesonide-formoterol (SYMBICORT) 160-4.5 MCG/ACT inhaler Inhale 2 puffs into the lungs 2 (two) times daily.  . busPIRone (BUSPAR) 15 MG tablet Take 1 tablet (15 mg total) by mouth 2 (two) times daily as needed.  . calcium carbonate (OSCAL) 1500 (600 Ca) MG TABS tablet Take 600 mg of elemental calcium by mouth daily with breakfast.   . cholecalciferol (VITAMIN D) 1000 UNITS tablet Take 1,000 Units by mouth daily.   Marland Kitchen diltiazem (CARDIZEM CD) 120 MG 24 hr capsule TAKE (1) CAPSULE DAILY  . ferrous sulfate 325 (65 FE) MG tablet Take 325 mg by mouth daily with breakfast.   . guaiFENesin (MUCINEX) 600 MG 12 hr tablet Take 600 mg by mouth daily.   Marland Kitchen levalbuterol (XOPENEX) 0.63 MG/3ML nebulizer solution Take 3 mLs (0.63 mg total) by nebulization every 6 (six) hours as needed for wheezing or shortness of breath.  . levofloxacin (LEVAQUIN) 500 MG tablet Take 1 tablet (500 mg total) by mouth daily.  Marland Kitchen levothyroxine (SYNTHROID, LEVOTHROID) 50 MCG tablet TAKE 1 TABLET IN MORNING  . magnesium oxide (MAG-OX) 400 (241.3 Mg) MG tablet Take 400 mg by mouth every other day.  . naphazoline-pheniramine (NAPHCON-A) 0.025-0.3 % ophthalmic solution Place 1 drop into both eyes 2 (two) times daily as needed for irritation or  allergies.   . nitroGLYCERIN (NITROSTAT) 0.4 MG SL tablet Place 1 tablet (0.4 mg total) under the tongue every 5 (five) minutes as needed for chest pain.  . pantoprazole (PROTONIX) 40 MG tablet TAKE (1) TABLET TWICE A DAY BEFORE MEALS.  Marland Kitchen polyethylene glycol powder (GLYCOLAX/MIRALAX) powder DISSOLVE 17 GM SCOOP IN 8 OZ. OF WATER AND DRINK ONCE DAILY AS NEEDEDFOR CONSTIPATION  . Respiratory Therapy Supplies (FLUTTER) DEVI 1 Device by  Does not apply route as needed.  . sucralfate (CARAFATE) 1 g tablet TAKE (1) TABLET FOUR TIMES DAILY AS NEEDED FOR GI UPSET  . vitamin B-12 (CYANOCOBALAMIN) 1000 MCG tablet Take 1,000 mcg by mouth daily.  . vitamin E (VITAMIN E) 400 UNIT capsule Take 400 Units by mouth daily.                  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 Bonchiectasis RLL- CT 2009 > not verified on CT 07/22/10     Past Surgical History:  Inguinal herniorrhaphy  PTCA/stent  LEFT & RIGHT FOOT RECONSTRUCTION  hiatal hernia repair 2009   Family History:  neg for pituitary dz  mother died age 88 from phlebitis  father died age 54 from heart failure  8 siblings  alive age 60,80,89,92  1 died age 24 heart failure  3 died age 45,76,90 from heart problems    Social History:  married  2 children  retiired  never smoked  no etoh         Objective:   Physical Exam   wt 167 07/05/2010  > 168 08/24/2010 > 04/05/2011 174> 07/12/2011  176> 09/24/2012  169 > 02/25/2013  175 >  01/31/2017   150 > 04/28/2017   156 > 07/31/2017    155  > 10/31/2017  149 > 11/13/2017   154 > 11/27/2017  146 > 12/25/2017   149 >02/05/2018  150     06/13/16 153 lb 12.8 oz (69.8 kg)  05/10/16 155 lb (70.3 kg)  05/06/16 154 lb (69.9 kg)     pleasant amb wm nad  Vital signs reviewed - Note on arrival 02 sats  97% on RA      HEENT: nl dentition / oropharynx. Nl external ear canals without cough reflex -  Mild bilateral non-specific turbinate edema     NECK :   without JVD/Nodes/TM/ nl carotid upstrokes bilaterally   LUNGS: no acc muscle use,  Mild barrel  contour chest wall with bilateral  Distant bs with minimal insp/exp rhonchi  and  without cough on insp or exp maneuver and mild  Hyperresonant  to  percussion bilaterally     CV:  RRR  no s3 or murmur or increase in P2, and no edema   ABD:  soft and nontender with pos end  insp Hoover's  in the supine position. No bruits or organomegaly appreciated, bowel sounds nl  MS:   Nl gait/  ext warm without deformities, calf tenderness, cyanosis or clubbing No obvious joint restrictions   SKIN: warm and dry without lesions    NEURO:  alert, approp,  nl sensorium with  no motor or cerebellar deficits apparent.             Assessment & Plan:

## 2018-02-06 NOTE — Assessment & Plan Note (Signed)
Sputum culture 12/25/2017 > mixed orgs/ lots of wbcs>>>  Nl flora/ afb neg smear -  HRCT 12/27/17  Bronchiectasis, tree-in-bud opacities, extensive airway debris/mucoid impaction and thick parenchymal banding in the mid to lower lungs, most prominent at the right lung base, similar to the 04/26/2017 chest CT study, with mild worsening of these findings in the interval. - VEST ordered 01/03/2018 > cancel 02/05/2018 at pt request    In retrospect when using flutter correctly/consistently doing better than on VEST so ok to d/c vest at this point   I had an extended discussion with the patient reviewing all relevant studies completed to date and  lasting 10  minutes of a 15 minute visit    Each maintenance medication was reviewed in detail including most importantly the difference between maintenance and prns and under what circumstances the prns are to be triggered using an action plan format that is not reflected in the computer generated alphabetically organized AVS.     Please see AVS for specific instructions unique to this visit that I personally wrote and verbalized to the the pt in detail and then reviewed with pt  by my nurse highlighting any  changes in therapy recommended at today's visit to their plan of care.

## 2018-02-06 NOTE — Assessment & Plan Note (Signed)
-   Spirometry 03/30/2010 FEV1  2.86 (116%) ratio 66  - FOB 02/01/2017  Copious retained secretions - Flutter valve re-training 04/28/2017  - PFT's  07/31/2017  FEV1 2.44 (102 % ) ratio 64  p 12 % improvement from saba p symbicort 160  prior to study with DLCO unable to perform  - 07/31/2017  After extensive coaching inhaler device  effectiveness =    75% from a baseline of 50% - 07/31/2017 d/c singulair and add levquin 500 mg daily x 7 day courses prn purulent sputum 11/06/2017- acute exacerbation with purluent sputum and low grade temp.Tx with levaquin - 11/13/2017    added back symb 80 2bid - 12/25/2017  After extensive coaching inhaler device,  effectiveness =    90%   Adequate control on present rx, reviewed in detail with pt > no change in rx needed

## 2018-02-09 ENCOUNTER — Other Ambulatory Visit: Payer: Self-pay | Admitting: Endocrinology

## 2018-03-07 ENCOUNTER — Other Ambulatory Visit: Payer: Self-pay | Admitting: Surgery

## 2018-03-07 DIAGNOSIS — I712 Thoracic aortic aneurysm, without rupture, unspecified: Secondary | ICD-10-CM

## 2018-03-08 NOTE — Telephone Encounter (Signed)
This MyChart encounter was inadvertently sent to the incorrect pool and therefor was not able to be seen by Pulmonary. Pt has been seen on 12.9.19. Will sign off.

## 2018-03-23 ENCOUNTER — Other Ambulatory Visit: Payer: Self-pay | Admitting: Endocrinology

## 2018-03-23 ENCOUNTER — Other Ambulatory Visit: Payer: Self-pay | Admitting: Family Medicine

## 2018-03-28 ENCOUNTER — Encounter: Payer: Self-pay | Admitting: Internal Medicine

## 2018-03-28 ENCOUNTER — Ambulatory Visit: Payer: Medicare Other | Admitting: Internal Medicine

## 2018-03-28 VITALS — BP 128/74 | HR 78 | Ht 63.0 in | Wt 156.0 lb

## 2018-03-28 DIAGNOSIS — J449 Chronic obstructive pulmonary disease, unspecified: Secondary | ICD-10-CM

## 2018-03-28 DIAGNOSIS — J471 Bronchiectasis with (acute) exacerbation: Secondary | ICD-10-CM

## 2018-03-28 MED ORDER — CIPROFLOXACIN HCL 500 MG PO TABS: 500.0000 mg | ORAL_TABLET | Freq: Two times a day (BID) | ORAL | 11 refills | Status: DC

## 2018-03-28 NOTE — Progress Notes (Signed)
Subjective:   Patient ID: [name], male    DOB: 05-30-1932   MRN: [id number]    Brief patient profile:  85   yowm quit smoking completely around [place] (relatively light) with cough and short of breath eval by  Dr Eliberto Ivory  Then [name] dx of copd/ cb  pft's showed fev1 116% 02/2010 though ratio 66 c/w GOLD I criteria with confirmed bronchiectasis in 11/2017      History of Present Illness  07/05/2010 ov cc recurrent pna's since June 2011 cc not back to baseline in terms of activities he enjoyed in May, for example  Could do some yardwork  and rarely  Needed saba and no need for any maint rx,   but on  spriva for sob and still frequent tightness generalized front more than back,  Equal both sides  low grade fever and mucus gets thick and yellow> admit to [facility] April 24-27 by [facility] dx of ? Pna.   No sob at rest.   Continue protonix 40 mg  Take 30-60 min before first meal of the day and Pepcid 20 mg at bedtime GERD (REFLUX) diet Try symbicort 160 Take 2 puffs bid and work on hfa technique     01/31/2017  f/u ov/Wert re:  Copd 1/ chronic bronchitis / ? Prev aspiraton with abnormal cT chest  ? obst RLL  Chief Complaint  Patient presents with  . Follow-up    Pt states he has had "a touch of PNA". He has been coughing since the beginning on Nov 2018.  He has taken doxy and levaquin.  He is coughing up greyish white sputum.  He has an albuterol inhaler that he rarely uses.   swallowing better now but still very congested cough esp since early nov 2018  Not using flutter valve as rec  hfa has improved but not yet adequate Still some hb/ sore throat symptoms despite reporting ppi bid  Also still some noct cough sev hours p lie down despite using 6 inch blocks rec FOB 02/01/17> tenacious secretions> nl flora/ no tb, no fungus on culture    04/28/2017  f/u ov/Wert re: f/u COPD / cb abn ct again Chief Complaint  Patient presents with  . Acute Visit    Increased cough x 10 days- prod with white to  grey sputum. He started on Doxy per Dr Laurance Flatten- has one dose remaining. He states his breathing is unchanged since his last visit. He rarely uses his albuterol inhaler.   Had CT Chest done 04/26/17.    did better p first few days p fob until the end Dec nasty mucus, more sob > doxy then added zpak then better again then worse again last week if Feb 2019 restarted doxy and now on day #  9/10 on  ct chest 04/26/17 obst again on w/u for TAA Doe = MMRC1 = can walk nl pace, flat grade, can't hurry or go uphills or steps s sob   Sleeps ok but quit a bit of am congestion /mucus still grey   rec Please see patient coordinator before you leave today  to schedule sinus CT> neg  For nasty mucus >  zpak  Prednisone 10 mg take  4 each am x 2 days,   2 each am x 2 days,  1 each am x 2 days and stop  Work on inhaler technique:       12/25/2017 acute ov/Wert re: bronchiectasis flare, last levaquin was 12/20/17  / maint on  smyb 160 Chief Complaint  Patient presents with  . Acute Visit    started running fever 12/24/17. He also has had chest congestion and cough with dark brown sputum. He rarely uses his albuterol inhaler or neb.   Dyspnea:  MMRC3 = can't walk 100 yards even at a slow pace at a flat grade s stopping due to sob   Cough: worse one hour p hs with lots of yellow mucus esp in am / assoc overt HB Sleeping: 6 inch hob  SABA use: only once a day rec Sputum culture: nl flora/ neg afb smear  Mucinex is 1200 mg every 12  hours with flutter valve as much as possible  HRCT 11/3017 c/w bronchiectasis  VEST rec   02/05/2018  f/u ov/Wert re: copd 1, bronchiectasis maint on symb 160 2bid/flutter/vest  Chief Complaint  Patient presents with  . Follow-up   Dyspnea:  No change = mmrc3 Cough: better with approp use of flutter, no better with vest  Sleeping: 6 in blocks, some worse cough / congestion in am but not noct  SABA use: min rec Order was sent to DME company to discontinue your VEST        03/28/2018  f/u ov/Wert re: copd 1 with bronchiectasis/ maint symb 160 2bid Chief Complaint  Patient presents with  . Follow-up    Pt has productive cough-white, chest tightness, SOB with exertion.    Dyspnea:  MMRC2 = can't walk a nl pace on a flat grade s sob but does fine slow and flat ok at food liong Cough variably purulent up to a half a cup in 24 h, fltter helps  Sleeping: 6 in bed blocks/ pillows x one  SABA use: varies  02: not using    No obvious day to day or daytime variability or assoc   mucus plugs or hemoptysis or cp or   subjective wheeze or overt sinus or hb symptoms.   Sleeps  without nocturnal  or early am exacerbation  of respiratory  c/o's or need for noct saba. Also denies any obvious fluctuation of symptoms with weather or environmental changes or other aggravating or alleviating factors except as outlined above   No unusual exposure hx or h/o childhood pna/ asthma or knowledge of premature birth.  Current Allergies, Complete Past Medical History, Past Surgical History, Family History, and Social History were reviewed in [employer] record.  ROS  The following are not active complaints unless bolded Hoarseness, sore throat, dysphagia, dental problems, itching, sneezing,  nasal congestion or discharge of excess mucus or purulent secretions, ear ache,   fever, chills, sweats, unintended wt loss or wt gain, classically pleuritic or exertional cp,  orthopnea pnd or arm/hand swelling  or leg swelling, presyncope, palpitations, abdominal pain, anorexia, nausea, vomiting, diarrhea  or change in bowel habits or change in bladder habits, change in stools or change in urine, dysuria, hematuria,  rash, arthralgias, visual complaints, headache, numbness, weakness or ataxia or problems with walking or coordination,  change in mood or  memory.        Current Meds  Medication Sig  . albuterol (PROVENTIL HFA;VENTOLIN HFA) 108 (90 Base) MCG/ACT inhaler  Inhale 2 puffs into the lungs every 6 (six) hours as needed for wheezing or shortness of breath.  . bisacodyl (DULCOLAX) 10 MG suppository Place 10 mg rectally as needed for moderate constipation.  . bromocriptine (PARLODEL) 2.5 MG tablet TAKE  (1)  TABLET TWICE A DAY.  . budesonide-formoterol (SYMBICORT)  160-4.5 MCG/ACT inhaler Inhale 2 puffs into the lungs 2 (two) times daily.  . busPIRone (BUSPAR) 15 MG tablet Take 1 tablet (15 mg total) by mouth 2 (two) times daily as needed.  . calcium carbonate (OSCAL) 1500 (600 Ca) MG TABS tablet Take 600 mg of elemental calcium by mouth daily with breakfast.   . cholecalciferol (VITAMIN D) 1000 UNITS tablet Take 1,000 Units by mouth daily.   Marland Kitchen diltiazem (CARDIZEM CD) 120 MG 24 hr capsule TAKE (1) CAPSULE DAILY  . ferrous sulfate 325 (65 FE) MG tablet Take 325 mg by mouth daily with breakfast.   . guaiFENesin (MUCINEX) 600 MG 12 hr tablet Take 600 mg by mouth daily.   Marland Kitchen levalbuterol (XOPENEX) 0.63 MG/3ML nebulizer solution Take 3 mLs (0.63 mg total) by nebulization every 6 (six) hours as needed for wheezing or shortness of breath.  . levofloxacin (LEVAQUIN) 500 MG tablet Take 1 tablet (500 mg total) by mouth daily.  Marland Kitchen levothyroxine (SYNTHROID, LEVOTHROID) 50 MCG tablet TAKE 1 TABLET IN MORNING  . magnesium oxide (MAG-OX) 400 (241.3 Mg) MG tablet Take 400 mg by mouth every other day.  . naphazoline-pheniramine (NAPHCON-A) 0.025-0.3 % ophthalmic solution Place 1 drop into both eyes 2 (two) times daily as needed for irritation or allergies.   . nitroGLYCERIN (NITROSTAT) 0.4 MG SL tablet Place 1 tablet (0.4 mg total) under the tongue every 5 (five) minutes as needed for chest pain.  . pantoprazole (PROTONIX) 40 MG tablet TAKE (1) TABLET TWICE A DAY BEFORE MEALS.  Marland Kitchen polyethylene glycol powder (GLYCOLAX/MIRALAX) powder DISSOLVE 17 GM SCOOP IN 8 OZ. OF WATER AND DRINK ONCE DAILY AS NEEDEDFOR CONSTIPATION  . Respiratory Therapy Supplies (FLUTTER) DEVI 1 Device by  Does not apply route as needed.  . sucralfate (CARAFATE) 1 g tablet TAKE (1) TABLET FOUR TIMES DAILY AS NEEDED FOR GI UPSET  . vitamin B-12 (CYANOCOBALAMIN) 1000 MCG tablet Take 1,000 mcg by mouth daily.  . vitamin E (VITAMIN E) 400 UNIT capsule Take 400 Units by mouth daily.                        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 Bonchiectasis RLL- CT 2009 > not verified on CT 07/22/10     Past Surgical History:  Inguinal herniorrhaphy  PTCA/stent  LEFT & RIGHT FOOT RECONSTRUCTION  hiatal hernia repair 2009   Family History:  neg for pituitary dz  mother died age 57 from phlebitis  father died age 40 from heart failure  8 siblings  alive age 35,80,89,92  1 died age 61 heart failure  3 died age 34,76,90 from heart problems    Social History:  married  2 children  retiired  never smoked  no etoh         Objective:   Physical Exam    03/28/2018     wt 167 07/05/2010  > 168 08/24/2010 > 04/05/2011 174> 07/12/2011  176> 09/24/2012  169 > 02/25/2013  175 >  01/31/2017   150 > 04/28/2017   156 > 07/31/2017  155  > 10/31/2017  149 > 11/13/2017   154 > 11/27/2017  146 > 12/25/2017   149 >02/05/2018  150     06/13/16 153 lb 12.8 oz (69.8 kg)  05/10/16 155 lb (70.3 kg)  05/06/16 154 lb (69.9 kg)    Pleasant amb wm nad    Vital signs reviewed - Note on arrival 02 sats  97% on RA      HEENT: nl dentition / oropharynx. Nl external ear canals without cough reflex -  Mild bilateral non-specific turbinate  edema     NECK :  without JVD/Nodes/TM/ nl carotid upstrokes bilaterally   LUNGS: no acc muscle use,  Mild barrel  contour chest wall with bilateral  Insp/exp rhonchi   without cough on insp or exp maneuver and mild  Hyperresonant  to  percussion bilaterally     CV:  RRR  no s3 or murmur or increase in P2, and no edema   ABD:  soft and nontender with pos late insp Hoover's  in the supine position. No bruits or organomegaly appreciated, bowel sounds nl  MS:   Nl gait/  ext warm without deformities, calf tenderness, cyanosis or clubbing No obvious joint restrictions   SKIN: warm and dry without lesions    NEURO:  alert, approp, nl sensorium with  no motor or cerebellar deficits apparent.        Assessment & Plan:

## 2018-03-28 NOTE — Patient Instructions (Signed)
If mucus turns nasty/bloody > cipro 500 mg twice daily x 7 days (refillable)   Please remember to go to the lab department   for your tests - we will call you with the results when they are available.       Please schedule a follow up visit in 3 months but call sooner if needed

## 2018-03-28 NOTE — Assessment & Plan Note (Signed)
Quit smoking completely 1980 mostly pipes     - Spirometry 03/30/2010 FEV1  2.86 (116%) ratio 66  - FOB 02/01/2017  Copious retained secretions - Flutter valve re-training 04/28/2017  - PFT's  07/31/2017  FEV1 2.44 (102 % ) ratio 64  p 12 % improvement from saba p symbicort 160  prior to study with DLCO unable to perform  - 07/31/2017  After extensive coaching inhaler device  effectiveness =    75% from a baseline of 50% - 07/31/2017 d/c singulair and add levquin 500 mg daily x 7 day courses prn purulent sputum 11/06/2017- acute exacerbation with purluent sputum and low grade temp.Tx with levaquin - 11/13/2017    added back symb 80 2bid> increased to 160   - 03/28/2018  After extensive coaching inhaler device,  effectiveness =    90% so continue symb 160 2bid     Adequate control on present rx, reviewed in detail with pt > no change in rx needed     I had an extended discussion with the patient reviewing all relevant studies completed to date and  lasting 15 to 20 minutes of a 25 minute visit    See device teaching which extended face to face time for this visit.  Each maintenance medication was reviewed in detail including emphasizing most importantly the difference between maintenance and prns and under what circumstances the prns are to be triggered using an action plan format that is not reflected in the computer generated alphabetically organized AVS which I have not found useful in most complex patients, especially with respiratory illnesses  Please see AVS for specific instructions unique to this visit that I personally wrote and verbalized to the the pt in detail and then reviewed with pt  by my nurse highlighting any  changes in therapy recommended at today's visit to their plan of care.

## 2018-03-28 NOTE — Assessment & Plan Note (Signed)
HRCT chest  12/27/17  Bronchiectasis, tree-in-bud opacities, extensive airway debris/mucoid impaction and thick parenchymal banding in the mid to lower lungs, most prominent at the right lung base, similar to the 04/26/2017 chest CT study, with mild worsening of these findings in the interval. - VEST ordered 01/03/2018 > cancel 02/05/2018 at pt request   - cipro 500 bid x 7 days refillable 03/28/2018 >>>  - alpha one AT screen 03/28/2018  - Quant Ig's 03/28/2018   Ext discussion with pt re action plans to handle predictable complications from bronchiectasis. He is reasonably concerned about using FQ's and has read the side effects and is convinced he has most of them and did better on cipro so ok to use this x 7 day cycles for flares/ continue the flutter and bronchodilator rx (see copd)

## 2018-03-30 ENCOUNTER — Telehealth: Payer: Self-pay | Admitting: Internal Medicine

## 2018-03-30 NOTE — Telephone Encounter (Signed)
Call made to patient, made aware his lab results have not yet resulted. I informed the patient it would probably be Monday before we were able to contact him. Voiced understanding. Nothing further is needed at this time.

## 2018-04-02 ENCOUNTER — Telehealth: Payer: Self-pay | Admitting: Internal Medicine

## 2018-04-02 NOTE — Telephone Encounter (Signed)
Notes recorded by Nena Polio, CMA on 04/02/2018 at 9:21 AM EST LVMTCB x 1 for patient. ------  Notes recorded by Tanda Rockers, MD on 04/01/2018 at 5:38 AM EST Call patient : Studies are unremarkable, no change in recs  Pt is aware of results and voiced his understanding. Nothing further is needed.

## 2018-04-03 NOTE — Progress Notes (Signed)
HPI The patient presents for followup of his known coronary disease. He had a negative stress perfusion study in 2017.  He presents for follow up.  Since he was last seen he is done relatively well.  He has some chronic lung problems that is managed by Dr. Melvyn Novas.  He does some exercising and physical therapy.  He does relatively well with this.  He has a bit of a shuffling gait and is not using his cane.  He denies any palpitations, presyncope or syncope.  He said no chest pressure, neck or arm discomfort.  He said no weight gain or edema.  Allergies  Allergen Reactions  . Betadine [Povidone Iodine] Other (See Comments)    Reaction:  Blisters   . Lortab [Hydrocodone-Acetaminophen] Nausea And Vomiting  . Penicillins Other (See Comments)    Reaction:  Lightheadedness  Has patient had a PCN reaction causing immediate rash, facial/tongue/throat swelling, SOB or lightheadedness with hypotension: Yes Has patient had a PCN reaction causing severe rash involving mucus membranes or skin necrosis: No Has patient had a PCN reaction that required hospitalization No Has patient had a PCN reaction occurring within the last 10 years: No If all of the above answers are "NO", then may proceed with Cephalosporin use.  Marland Kitchen Zetia [Ezetimibe] Other (See Comments)    Reaction:  Muscle weakness   . Zocor [Simvastatin] Nausea Only and Other (See Comments)    Reaction:  Muscle weakness     Current Outpatient Medications  Medication Sig Dispense Refill  . albuterol (PROVENTIL HFA;VENTOLIN HFA) 108 (90 Base) MCG/ACT inhaler Inhale 2 puffs into the lungs every 6 (six) hours as needed for wheezing or shortness of breath. 18 g 2  . bisacodyl (DULCOLAX) 10 MG suppository Place 10 mg rectally as needed for moderate constipation.    . bromocriptine (PARLODEL) 2.5 MG tablet TAKE  (1)  TABLET TWICE A DAY. 60 tablet 3  . budesonide-formoterol (SYMBICORT) 160-4.5 MCG/ACT inhaler Inhale 2 puffs into the lungs 2 (two) times  daily. 1 Inhaler 11  . busPIRone (BUSPAR) 15 MG tablet Take 1 tablet (15 mg total) by mouth 2 (two) times daily as needed. 60 tablet 1  . calcium carbonate (OSCAL) 1500 (600 Ca) MG TABS tablet Take 600 mg of elemental calcium by mouth daily with breakfast.     . cholecalciferol (VITAMIN D) 1000 UNITS tablet Take 1,000 Units by mouth daily.     . ciprofloxacin (CIPRO) 500 MG tablet Take 1 tablet (500 mg total) by mouth 2 (two) times daily. 14 tablet 11  . diltiazem (CARDIZEM CD) 120 MG 24 hr capsule TAKE (1) CAPSULE DAILY 90 capsule 0  . ferrous sulfate 325 (65 FE) MG tablet Take 325 mg by mouth daily with breakfast.     . guaiFENesin (MUCINEX) 600 MG 12 hr tablet Take 600 mg by mouth daily.     Marland Kitchen levalbuterol (XOPENEX) 0.63 MG/3ML nebulizer solution Take 3 mLs (0.63 mg total) by nebulization every 6 (six) hours as needed for wheezing or shortness of breath. 72 mL 1  . levothyroxine (SYNTHROID, LEVOTHROID) 50 MCG tablet TAKE 1 TABLET IN MORNING 30 tablet 0  . magnesium oxide (MAG-OX) 400 (241.3 Mg) MG tablet Take 400 mg by mouth every other day.    . naphazoline-pheniramine (NAPHCON-A) 0.025-0.3 % ophthalmic solution Place 1 drop into both eyes 2 (two) times daily as needed for irritation or allergies.     . nitroGLYCERIN (NITROSTAT) 0.4 MG SL tablet Place 1 tablet (  0.4 mg total) under the tongue every 5 (five) minutes as needed for chest pain. 25 tablet 2  . pantoprazole (PROTONIX) 40 MG tablet TAKE (1) TABLET TWICE A DAY BEFORE MEALS. 180 tablet 1  . polyethylene glycol powder (GLYCOLAX/MIRALAX) powder DISSOLVE 17 GM SCOOP IN 8 OZ. OF WATER AND DRINK ONCE DAILY AS NEEDEDFOR CONSTIPATION 527 g 0  . sucralfate (CARAFATE) 1 g tablet TAKE (1) TABLET FOUR TIMES DAILY AS NEEDED FOR GI UPSET 120 tablet 0  . vitamin B-12 (CYANOCOBALAMIN) 1000 MCG tablet Take 1,000 mcg by mouth daily.    . vitamin E (VITAMIN E) 400 UNIT capsule Take 400 Units by mouth daily.    Marland Kitchen Respiratory Therapy Supplies (FLUTTER) DEVI  1 Device by Does not apply route as needed. 1 each 0   No current facility-administered medications for this visit.     Past Medical History:  Diagnosis Date  . Abnormality of gait 06/07/2013  . Anemia   . Aneurysm ([place])    on assending aorta, currently watching it, Dr Cyndia Bent  . Anxiety   . Arthritis   . Asthma   . Atrial fibrillation ([place])   . Barrett's esophagus   . CAD (coronary artery disease) 03/2006   3.0 x 20 mm TAXUS [place]se[place] DES to the LAD; 01/2007  L main 30%, oLAD 50%, pLAD stent ok, CFX 80%, OM 60%, pRCA 60%, mRCA 70%, oPDA 90%; med rx   . Cancer ([place])    skin cancer   . Cataract    bilateral removal of cateracts  . Complication of anesthesia     no issues,but pt prefers spinal due to Pulmonary problems  . COPD (chronic obstructive pulmonary disease) ([place])    oxygen  on standby in home.  . Depression   . Diverticulosis   . Emphysema   . Enlarged prostate with urinary retention   . Esophageal stenosis   . GERD (gastroesophageal reflux disease)   . Hiatal hernia   . Hypothyroidism   . Neuropathy   . Osteoporosis   . Pituitary macroadenoma ([place])   . Restless legs syndrome (RLS)   . UGIB (upper gastrointestinal bleed) 03/2013   EGD w/ large ulcer at GE junction    Past Surgical History:  Procedure Laterality Date  . ABDOMINAL HERNIA REPAIR   2008  . CARDIAC CATHETERIZATION  01/2007   L main 30%, oLAD 50%,  pLAD stent ok, CFX 80%, OM 60%, pRCA 60%, mRCA 70%, oPDA 90%; med rx  . cataract extraction both eyes    . COLONOSCOPY    . CORONARY STENT PLACEMENT  03/2006   3.0 x 20 mm TAXUS Perseus DES to the LAD  . CYSTOSCOPY N/A 06/10/2015   Procedure: CYSTOSCOPY FULGRATION OF BLEEDING,  electovapor resection;  Surgeon: Irine Seal, MD;  Location: [facility];  Service: Urology;  Laterality: N/A;  . CYSTOSCOPY WITH INSERTION OF UROLIFT N/A 06/01/2015   Procedure: CYSTOSCOPY WITH INSERTION OF UROLIFT x4;  Surgeon: Irine Seal, MD;  Location: [facility];  Service: Urology;   Laterality: N/A;  . ESOPHAGOGASTRODUODENOSCOPY N/A 04/20/2013   Procedure: ESOPHAGOGASTRODUODENOSCOPY (EGD);  Surgeon: Inda Castle, MD;  Location: [facility];  Service: Endoscopy;  Laterality: N/A;  . ESOPHAGOGASTRODUODENOSCOPY N/A 03/25/2016   Procedure: ESOPHAGOGASTRODUODENOSCOPY (EGD);  Surgeon: Irene Shipper, MD;  Location: [facility];  Service: Endoscopy;  Laterality: N/A;  . ESOPHAGOGASTRODUODENOSCOPY (EGD) WITH PROPOFOL N/A 10/13/2015   Procedure: ESOPHAGOGASTRODUODENOSCOPY (EGD) WITH PROPOFOL;  Surgeon: Jerene Bears, MD;  Location: [facility];  Service:  Gastroenterology;  Laterality: N/A;  . FOOT SURGERY  1994 left, 2002 right foot   bilateral foot reconstruciton  . FOOT SURGERY     reconstruction of both feet- no retained hardware.  Marland Kitchen HIATAL HERNIA REPAIR  01-04-2008  . MELANOMA EXCISION  2019  . POLYPECTOMY    . PROSTATE SURGERY     x 2  . SAVORY DILATION N/A 03/25/2016   Procedure: SAVORY DILATION;  Surgeon: Irene Shipper, MD;  Location: [facility];  Service: Endoscopy;  Laterality: N/A;  . TOTAL HIP ARTHROPLASTY  07/21/2011   Procedure: TOTAL HIP ARTHROPLASTY ANTERIOR APPROACH;  Surgeon: Mauri Pole, MD;  Location: [facility];  Service: Orthopedics;  Laterality: Left;  . TOTAL HIP ARTHROPLASTY Right 09/07/2012   Procedure: RIGHT TOTAL HIP ARTHROPLASTY ANTERIOR APPROACH;  Surgeon: Mcarthur Rossetti, MD;  Location: [facility];  Service: Orthopedics;  Laterality: Right;  . TRANSURETHRAL RESECTION OF BLADDER NECK N/A 11/04/2015   Procedure: RESECTION OF BLADDER NECK;  Surgeon: Cleon Gustin, MD;  Location: [facility];  Service: Urology;  Laterality: N/A;  . TRANSURETHRAL RESECTION OF PROSTATE N/A 11/04/2015   Procedure: TRANSURETHRAL RESECTION OF THE PROSTATE (TURP); REMOVAL OF UROLIFT IMPLANTS X THREE;  Surgeon: Cleon Gustin, MD;  Location: [facility];  Service: Urology;  Laterality: N/A;  . UPPER GASTROINTESTINAL ENDOSCOPY  12/2013   Dr Hilarie Fredrickson, gastritis  . VIDEO BRONCHOSCOPY  Bilateral 02/01/2017   Procedure: VIDEO BRONCHOSCOPY WITHOUT FLUORO;  Surgeon: Tanda Rockers, MD;  Location: [facility];  Service: Cardiopulmonary;  Laterality: Bilateral;    ROS: As stated in the HPI and negative for all other systems.  PHYSICAL EXAM BP 120/68   Pulse 74   Ht 5\' 3"  (1.6 m)   Wt 154 lb (69.9 kg)   BMI 27.28 kg/m   GENERAL:  Well appearing NECK:  No jugular venous distention, waveform within normal limits, carotid upstroke brisk and symmetric, no bruits, no thyromegaly LUNGS:  Clear to auscultation bilaterally CHEST:  Unremarkable HEART:  PMI not displaced or sustained,S1 and S2 within normal limits, no S3, no S4, no clicks, no rubs, no murmurs ABD:  Flat, positive bowel sounds normal in frequency in pitch, no bruits, no rebound, no guarding, no midline pulsatile mass, no hepatomegaly, no splenomegaly EXT:  2 plus pulses throughout, no edema, no cyanosis no clubbing   Lab Results  Component Value Date   CHOL 166 12/18/2017   TRIG 54 12/18/2017   HDL 51 12/18/2017   LDLCALC 104 (H) 12/18/2017   EKG:  Sinus rhythm, rate 74, axis within normal limits, intervals within normal limits, low voltage in the limb leads. no acute ST-T wave changes.  Early transition lead V2 04/04/2018    ASSESSMENT AND PLAN   CORONARY ARTERY DISEASE -   Patient has no new symptoms.  No further cardiovascular testing is suggested.  ATRIAL FIB - He has had no recurrent arrhythmias.  Because of previous GI bleeding is not an anticoagulation candidate.   HYPERLIPIDEMIA -  I will defer to Dr. Laurance Flatten.  No change in therapy.  ASCENDING AORTIC ANEURYSM- This is 4.7 on CT last year.  He is scheduled for follow up this month.  This is followed by Dr. Cyndia Bent.

## 2018-04-04 ENCOUNTER — Ambulatory Visit: Payer: Medicare Other | Admitting: Cardiology

## 2018-04-04 ENCOUNTER — Encounter: Payer: Self-pay | Admitting: Cardiology

## 2018-04-04 VITALS — BP 120/68 | HR 74 | Ht 63.0 in | Wt 154.0 lb

## 2018-04-04 DIAGNOSIS — I480 Paroxysmal atrial fibrillation: Secondary | ICD-10-CM | POA: Diagnosis not present

## 2018-04-04 DIAGNOSIS — I2510 Atherosclerotic heart disease of native coronary artery without angina pectoris: Secondary | ICD-10-CM

## 2018-04-04 NOTE — Patient Instructions (Signed)
Medication Instructions:  The current medical regimen is effective;  continue present plan and medications.  If you need a refill on your cardiac medications before your next appointment, please call your pharmacy.   Follow-Up: Follow up in 1 year with Dr. Hochrein.  You will receive a letter in the mail 2 months before you are due.  Please call us when you receive this letter to schedule your follow up appointment.  Thank you for choosing [facility]!!     

## 2018-04-05 LAB — ALPHA-1 ANTITRYPSIN PHENOTYPE: A-1 Antitrypsin, Ser: 174 mg/dL (ref 83–199)

## 2018-04-05 LAB — IGG, IGA, IGM
IgG (Immunoglobin G), Serum: 1331 mg/dL (ref 600–1540)
IgM, Serum: 101 mg/dL (ref 50–300)
Immunoglobulin A: 223 mg/dL (ref 70–320)

## 2018-04-17 ENCOUNTER — Encounter: Payer: Self-pay | Admitting: Family Medicine

## 2018-04-18 ENCOUNTER — Ambulatory Visit (INDEPENDENT_AMBULATORY_CARE_PROVIDER_SITE_OTHER): Payer: Medicare Other | Admitting: Family Medicine

## 2018-04-18 ENCOUNTER — Encounter: Payer: Self-pay | Admitting: Family Medicine

## 2018-04-18 VITALS — BP 156/82 | HR 61 | Temp 97.0°F | Ht 63.0 in | Wt 156.2 lb

## 2018-04-18 DIAGNOSIS — R002 Palpitations: Secondary | ICD-10-CM

## 2018-04-18 DIAGNOSIS — R1031 Right lower quadrant pain: Secondary | ICD-10-CM | POA: Diagnosis not present

## 2018-04-18 NOTE — Progress Notes (Signed)
HPI:  Pt is an 85 y/o male presenting for a 5-day history of abdominal pain and an episode of abdominal "bulging." On Friday, pt states he bent over, and felt 7/10 sharp pain and a bulge in the right lower quadrant, which resolved within 5 minutes.  Pt states he had a similar episode 15 years ago, but was never examined.  He notes mild continued discomfort in the RLQ. He denies fevers, blood in stools, nausea, and change in bowel habits.   Pt also complains of fluttering in his chest, occurring every 1-2 minutes. These episodes last for 5-10 seconds. Pt denies any associated discomfort, chest pain or shortness of breath. He states he has a history of afib, and is followed by a cardiologist.    ROS:  GEN: (-) fevers, recent illness CARDIO: (+) palpitations every 1-2 minutes; (-) chest pain PULM: (-) SOB, cough GI: (+) RLQ pain and episode of bulging; (-) change in bowel habits, blood in stool, nausea, vomiting  PMH: Hiatal hernia, colonic polyps, diverticulitis, afib Meds: Pantoprazole, 40mg  2x daily, Diltiazem, 120mg  daily, Sucralfate, 1g Daily as needed  PE: Blood pressure (!) 156/82, pulse 61, temperature (!) 97 F (36.1 C), temperature source Oral, height 5\' 3"  (1.6 m), weight 70.9 kg.  GEN: Pt is an 85 y/o male, appears his stated age, in no acute distress CARDIO: normal rate and rhythm, no extra heart sounds PULM: lungs clear to auscultation bilaterally GI: positive bowel sounds in all four quadrants, mild tenderness to deep palpation in RLQ, no evidence of masses or bulges on exam or hernia  Assessment and Plan:  Abdominal Muscle Spasm: - No action needed at this time. Advise patient to return if symptoms worsen.   Palpitations:  - Apply Holter monitor for 24 hours, results will be sent to Dr. Laurance Flatten.   I was personally present for all components of the history, physical exam and/or medical decision making.  I agree with the documentation performed by the PA student and agree with  assessment and plan above.  PA student was Johnney Killian. Caryl Pina, MD [facility] 04/20/2018, 7:39 AM

## 2018-04-19 DIAGNOSIS — R002 Palpitations: Secondary | ICD-10-CM | POA: Diagnosis not present

## 2018-04-20 NOTE — Addendum Note (Signed)
Addended by: Marin Olp on: 04/20/2018 04:44 PM   Modules accepted: Orders

## 2018-04-24 ENCOUNTER — Encounter: Payer: Self-pay | Admitting: Surgery

## 2018-04-25 ENCOUNTER — Ambulatory Visit: Payer: Medicare Other | Admitting: Surgery

## 2018-04-25 ENCOUNTER — Other Ambulatory Visit: Payer: Medicare Other

## 2018-04-26 ENCOUNTER — Telehealth: Payer: Self-pay | Admitting: *Deleted

## 2018-04-26 NOTE — Progress Notes (Signed)
Call to [facility] with Dr Martina Sinner will review to see how urgently pt needs to be seen and call pt with appt

## 2018-04-26 NOTE — Telephone Encounter (Signed)
Received call from [facility] with [facility].  Pt had a recent 24 hour holter demonstrating NSR with PVCs and PACs.  Dr Laurance Flatten would like Dr Percival Spanish to review the holter results from 2/21 to determine appropriate treatment.  Pt has been having palpitations.  Pt was recently seen by Dr Percival Spanish 04/04/2018 and was doing well. Advised I will forward information to Dr Percival Spanish for review.

## 2018-04-29 NOTE — Telephone Encounter (Signed)
Please get him an appt early in the morning (9 am) when I am back in [place].  f

## 2018-04-30 NOTE — Telephone Encounter (Signed)
Pt aware of appt date and time

## 2018-04-30 NOTE — Telephone Encounter (Signed)
Left message for pt to c/b to discuss appt.  We can add him on to the [facility] schedule 3/11 at 9 AM per Dr Hochrein's instructions.

## 2018-05-01 ENCOUNTER — Encounter: Payer: Self-pay | Admitting: Primary Care

## 2018-05-01 ENCOUNTER — Ambulatory Visit: Payer: Medicare Other | Admitting: Primary Care

## 2018-05-01 ENCOUNTER — Ambulatory Visit: Payer: Medicare Other | Admitting: Internal Medicine

## 2018-05-01 ENCOUNTER — Ambulatory Visit (INDEPENDENT_AMBULATORY_CARE_PROVIDER_SITE_OTHER): Payer: Medicare Other | Admitting: Primary Care

## 2018-05-01 DIAGNOSIS — J471 Bronchiectasis with (acute) exacerbation: Secondary | ICD-10-CM | POA: Diagnosis not present

## 2018-05-01 DIAGNOSIS — J449 Chronic obstructive pulmonary disease, unspecified: Secondary | ICD-10-CM | POA: Diagnosis not present

## 2018-05-01 MED ORDER — MOMETASONE FURO-FORMOTEROL FUM 200-5 MCG/ACT IN AERO: 2.0000 | INHALATION_SPRAY | Freq: Two times a day (BID) | RESPIRATORY_TRACT | 0 refills | Status: DC

## 2018-05-01 MED ORDER — MOMETASONE FURO-FORMOTEROL FUM 200-5 MCG/ACT IN AERO: 2.0000 | INHALATION_SPRAY | Freq: Two times a day (BID) | RESPIRATORY_TRACT | 6 refills | Status: DC

## 2018-05-01 MED ORDER — FLUTTER DEVI: 0 refills | Status: AC

## 2018-05-01 NOTE — Progress Notes (Signed)
@Patient  ID: [name], male    DOB: [birth year], 85 y.o.   MRN: [id number]  Chief Complaint  Patient presents with  . Follow-up    6 month follow cough    Referring provider: Chipper Herb, MD  HPI: 85 year old male, former smoker (quit 1980). PMH significant for COPD GOLD I, chronic bronchitis, bronchiectasis. Patient of Dr. Melvyn Novas, last seen on 03/28/18. Maintained on Symbicort 160 2 bid. Vest ordered but cancelled at patients request. Takes ciprofloxacin x 7 days for flares. Has needed to take it once, has refill available.   05/01/2018 Patient presents today for regular office visit. He reports that he has been doing quite well. Breathing has been stable, continues using Symbicort twice daily as prescribed. States inhaler cost is somewhat expensive for him. Uses his Albuterol rescue inhaler approx once a week. Took ciprofloxacin once for bronchiectasis flare in January, has refill on hand if needed. Needs new flutter valve, reports that the resistance is not working. Using it daily and getting up mucus. Afebrile.    Testing reviewed: PFT's  07/31/2017  FEV1 2.44 (102 % ) ratio 64  p 12 % improvement from saba p symbicort 160 prior to study with DLCO unable to perform.   HRCT chest  12/27/17  Bronchiectasis, tree-in-bud opacities, extensive airway debris/mucoid impaction and thick parenchymal banding in the mid to lower lungs, most prominent at the right lung base, similar to the 04/26/2017 chest CT study, with mild worsening of these findings in the interval.    Allergies  Allergen Reactions  . Betadine [Povidone Iodine] Other (See Comments)    Reaction:  Blisters   . Lortab [Hydrocodone-Acetaminophen] Nausea And Vomiting  . Penicillins Other (See Comments)    Reaction:  Lightheadedness  Has patient had a PCN reaction causing immediate rash, facial/tongue/throat swelling, SOB or lightheadedness with hypotension: Yes Has patient had a PCN reaction causing severe rash  involving mucus membranes or skin necrosis: No Has patient had a PCN reaction that required hospitalization No Has patient had a PCN reaction occurring within the last 10 years: No If all of the above answers are "NO", then may proceed with Cephalosporin use.  Marland Kitchen Zetia [Ezetimibe] Other (See Comments)    Reaction:  Muscle weakness   . Zocor [Simvastatin] Nausea Only and Other (See Comments)    Reaction:  Muscle weakness     Immunization History  Administered Date(s) Administered  . Influenza Whole 11/22/2007, 10/29/2009, 10/30/2010, 11/29/2011  . Influenza, High Dose Seasonal PF 12/03/2015, 12/26/2016, 12/27/2017  . Influenza,inj,Quad PF,6+ Mos 12/03/2012, 11/25/2013, 12/19/2014  . Pneumococcal Conjugate-13 01/09/2013  . Pneumococcal Polysaccharide-23 10/29/2009  . Td 04/29/1998  . Zoster 03/31/2006    Past Medical History:  Diagnosis Date  . Abnormality of gait 06/07/2013  . Anemia   . Aneurysm ([place])    on assending aorta, currently watching it, Dr Cyndia Bent  . Anxiety   . Arthritis   . Asthma   . Atrial fibrillation ([place])   . Barrett's esophagus   . CAD (coronary artery disease) 03/2006   3.0 x 20 mm TAXUS [place] DES to the LAD; 01/2007  L main 30%, oLAD 50%, pLAD stent ok, CFX 80%, OM 60%, pRCA 60%, mRCA 70%, oPDA 90%; med rx   . Cancer ([place])    skin cancer   . Cataract    bilateral removal of cateracts  . Complication of anesthesia     no issues,but pt prefers spinal due to Pulmonary problems  . COPD (  chronic obstructive pulmonary disease) ([place])    oxygen  on standby in home.  . Depression   . Diverticulosis   . Emphysema   . Enlarged prostate with urinary retention   . Esophageal stenosis   . GERD (gastroesophageal reflux disease)   . Hiatal hernia   . Hypothyroidism   . Neuropathy   . Osteoporosis   . Pituitary macroadenoma ([place])   . Restless legs syndrome (RLS)   . UGIB (upper gastrointestinal bleed) 03/2013   EGD w/ large ulcer at GE junction    Tobacco  History: Social History   Tobacco Use  Smoking Status Never Smoker  Smokeless Tobacco Never Used   Counseling given: Not Answered   Outpatient Medications Prior to Visit  Medication Sig Dispense Refill  . albuterol (PROVENTIL HFA;VENTOLIN HFA) 108 (90 Base) MCG/ACT inhaler Inhale 2 puffs into the lungs every 6 (six) hours as needed for wheezing or shortness of breath. 18 g 2  . bisacodyl (DULCOLAX) 10 MG suppository Place 10 mg rectally as needed for moderate constipation.    . bromocriptine (PARLODEL) 2.5 MG tablet TAKE  (1)  TABLET TWICE A DAY. 60 tablet 3  . budesonide-formoterol (SYMBICORT) 160-4.5 MCG/ACT inhaler Inhale 2 puffs into the lungs 2 (two) times daily. 1 Inhaler 11  . busPIRone (BUSPAR) 15 MG tablet Take 1 tablet (15 mg total) by mouth 2 (two) times daily as needed. 60 tablet 1  . calcium carbonate (OSCAL) 1500 (600 Ca) MG TABS tablet Take 600 mg of elemental calcium by mouth daily with breakfast.     . cholecalciferol (VITAMIN D) 1000 UNITS tablet Take 1,000 Units by mouth daily.     Marland Kitchen diltiazem (CARDIZEM CD) 120 MG 24 hr capsule TAKE (1) CAPSULE DAILY 90 capsule 0  . ferrous sulfate 325 (65 FE) MG tablet Take 325 mg by mouth daily with breakfast.     . guaiFENesin (MUCINEX) 600 MG 12 hr tablet Take 600 mg by mouth daily.     Marland Kitchen levalbuterol (XOPENEX) 0.63 MG/3ML nebulizer solution Take 3 mLs (0.63 mg total) by nebulization every 6 (six) hours as needed for wheezing or shortness of breath. 72 mL 1  . levothyroxine (SYNTHROID, LEVOTHROID) 50 MCG tablet TAKE 1 TABLET IN MORNING 30 tablet 0  . magnesium oxide (MAG-OX) 400 (241.3 Mg) MG tablet Take 400 mg by mouth every other day.    . naphazoline-pheniramine (NAPHCON-A) 0.025-0.3 % ophthalmic solution Place 1 drop into both eyes 2 (two) times daily as needed for irritation or allergies.     . nitroGLYCERIN (NITROSTAT) 0.4 MG SL tablet Place 1 tablet (0.4 mg total) under the tongue every 5 (five) minutes as needed for chest  pain. 25 tablet 2  . pantoprazole (PROTONIX) 40 MG tablet TAKE (1) TABLET TWICE A DAY BEFORE MEALS. 180 tablet 1  . polyethylene glycol powder (GLYCOLAX/MIRALAX) powder DISSOLVE 17 GM SCOOP IN 8 OZ. OF WATER AND DRINK ONCE DAILY AS NEEDEDFOR CONSTIPATION 527 g 0  . Respiratory Therapy Supplies (FLUTTER) DEVI 1 Device by Does not apply route as needed. 1 each 0  . sucralfate (CARAFATE) 1 g tablet TAKE (1) TABLET FOUR TIMES DAILY AS NEEDED FOR GI UPSET 120 tablet 0  . vitamin B-12 (CYANOCOBALAMIN) 1000 MCG tablet Take 1,000 mcg by mouth daily.    . vitamin E (VITAMIN E) 400 UNIT capsule Take 400 Units by mouth daily.     No facility-administered medications prior to visit.     Review of Systems  Review of  Systems  Constitutional: Negative.   HENT: Negative.   Respiratory: Positive for cough. Negative for shortness of breath and wheezing.     Physical Exam  BP 116/70 (BP Location: Left Arm, Cuff Size: Normal)   Pulse 81   Ht 5\' 3"  (1.6 m)   Wt 154 lb 12.8 oz (70.2 kg)   SpO2 95%   BMI 27.42 kg/m  Physical Exam Constitutional:      Appearance: Normal appearance. He is not ill-appearing.  HENT:     Head: Normocephalic and atraumatic.     Right Ear: Tympanic membrane normal.     Left Ear: Tympanic membrane normal.     Mouth/Throat:     Mouth: Mucous membranes are moist.     Pharynx: Oropharynx is clear.  Cardiovascular:     Rate and Rhythm: Normal rate and regular rhythm.  Pulmonary:     Effort: Pulmonary effort is normal.     Breath sounds: Rhonchi present. No wheezing.  Musculoskeletal: Normal range of motion.  Skin:    General: Skin is warm and dry.  Neurological:     General: No focal deficit present.     Mental Status: He is alert and oriented to person, place, and time. Mental status is at baseline.  Psychiatric:        Mood and Affect: Mood normal.        Behavior: Behavior normal.        Thought Content: Thought content normal.        Judgment: Judgment normal.       Lab Results:  CBC    Component Value Date/Time   WBC 7.0 12/18/2017 0844   WBC 7.8 07/31/2017 1131   RBC 3.68 (L) 12/18/2017 0844   RBC 3.80 (L) 07/31/2017 1131   HGB 11.8 (L) 12/18/2017 0844   HCT 36.1 (L) 12/18/2017 0844   PLT 274 12/18/2017 0844   MCV 98 (H) 12/18/2017 0844   MCH 32.1 12/18/2017 0844   MCH 31.3 10/23/2015 1000   MCHC 32.7 12/18/2017 0844   MCHC 34.4 07/31/2017 1131   RDW 14.1 12/18/2017 0844   LYMPHSABS 1.4 12/18/2017 0844   MONOABS 1.0 07/31/2017 1131   EOSABS 0.2 12/18/2017 0844   BASOSABS 0.2 12/18/2017 0844    BMET    Component Value Date/Time   NA 140 12/18/2017 0844   K 4.0 12/18/2017 0844   CL 104 12/18/2017 0844   CO2 24 12/18/2017 0844   GLUCOSE 89 12/18/2017 0844   GLUCOSE 100 (H) 05/05/2017 1152   BUN 15 12/18/2017 0844   CREATININE 1.04 12/18/2017 0844   CALCIUM 8.7 12/18/2017 0844   GFRNONAA 66 12/18/2017 0844   GFRAA 76 12/18/2017 0844    BNP    Component Value Date/Time   BNP 224.9 (H) 06/12/2015 0033    ProBNP    Component Value Date/Time   PROBNP 261.9 04/18/2013 1421    Imaging: No results found.   Assessment & Plan:   COPD GOLD I - Stable; no issues - Symbicort is not preferred on insurance and patient finds it expensive - Plan change Symbicort 160 to Dulera 200 two puffs twice daily (sample given) - FU in 3 months with Dr. Melvyn Novas or sooner if needed   Bronchiectasis with (acute) exacerbation ([place]) - Doing well today, no recent exacerbations  - Has ciprofloxacin refill on hand if needed for acute exacerbation - Needs new flutter valve d/t it being broken, using daily and getting up mucus  Martyn Ehrich, NP 05/01/2018

## 2018-05-01 NOTE — Assessment & Plan Note (Addendum)
-   Doing well today, no recent exacerbations  - Has ciprofloxacin refill on hand if needed for acute exacerbation - Needs new flutter valve d/t it being broken, using daily and getting up mucus

## 2018-05-01 NOTE — Assessment & Plan Note (Addendum)
-   Stable; no issues - Symbicort is not preferred on insurance and patient finds it expensive - Plan change Symbicort 160 to Dulera 200 two puffs twice daily (sample given) - FU in 3 months with Dr. Melvyn Novas or sooner if needed

## 2018-05-01 NOTE — Addendum Note (Signed)
Addended by: Amado Coe on: 05/01/2018 03:40 PM   Modules accepted: Orders

## 2018-05-01 NOTE — Patient Instructions (Addendum)
Due to cost there are cheaper alternatives offered by your insurance plan  Stop Symbicort Start Dulera 200 - take 2 puffs twice daily   Take Ciprofloxacin antibiotic if you develop a flare of your bronchiectasis (colored/purulent mucus)  Use your flutter valve daily for congestion   Follow up in 3 months with Dr. Melvyn Novas, sooner if symptoms worsen

## 2018-05-04 ENCOUNTER — Ambulatory Visit (INDEPENDENT_AMBULATORY_CARE_PROVIDER_SITE_OTHER): Payer: Medicare Other | Admitting: Family Medicine

## 2018-05-04 ENCOUNTER — Encounter: Payer: Self-pay | Admitting: Family Medicine

## 2018-05-04 VITALS — BP 126/70 | HR 60 | Temp 96.8°F | Ht 63.0 in | Wt 155.0 lb

## 2018-05-04 DIAGNOSIS — I7121 Aneurysm of the ascending aorta, without rupture: Secondary | ICD-10-CM

## 2018-05-04 DIAGNOSIS — E034 Atrophy of thyroid (acquired): Secondary | ICD-10-CM

## 2018-05-04 DIAGNOSIS — E785 Hyperlipidemia, unspecified: Secondary | ICD-10-CM

## 2018-05-04 DIAGNOSIS — M40209 Unspecified kyphosis, site unspecified: Secondary | ICD-10-CM

## 2018-05-04 DIAGNOSIS — J42 Unspecified chronic bronchitis: Secondary | ICD-10-CM

## 2018-05-04 DIAGNOSIS — I712 Thoracic aortic aneurysm, without rupture: Secondary | ICD-10-CM

## 2018-05-04 DIAGNOSIS — E559 Vitamin D deficiency, unspecified: Secondary | ICD-10-CM

## 2018-05-04 DIAGNOSIS — I4891 Unspecified atrial fibrillation: Secondary | ICD-10-CM | POA: Diagnosis not present

## 2018-05-04 DIAGNOSIS — K219 Gastro-esophageal reflux disease without esophagitis: Secondary | ICD-10-CM

## 2018-05-04 DIAGNOSIS — I700 Atherosclerosis of aorta: Secondary | ICD-10-CM | POA: Diagnosis not present

## 2018-05-04 DIAGNOSIS — E221 Hyperprolactinemia: Secondary | ICD-10-CM

## 2018-05-04 DIAGNOSIS — I739 Peripheral vascular disease, unspecified: Secondary | ICD-10-CM

## 2018-05-04 NOTE — Progress Notes (Signed)
Subjective:    Patient ID: [name], male    DOB: Apr 11, 1932, 85 y.o.   MRN: [id number]  HPI Pt here for follow up and management of chronic medical problems which includes hypothyroid and hyperlipidemia. He is taking medication regularly.  This patient has multiple complaints and more issues with balance.  He also complains of numbness in the right arm when laying down.  He has muscle cramps and continues to have congestion and is followed regularly by the cardiologist and the pulmonologist.  His vital signs today are stable and his weight is stable.  Patient has a history of atherosclerosis of the aorta aortic aneurysm peripheral vascular disease and hypothyroidism.  He has ongoing COPD and bronchiectasis.  He also has had a history of GI bleeds and a problem with gastritis and hiatal hernia.  He has recently been to the pulmonology office as well as the cardiologist.  He is followed regularly for his aneurysm by Dr. Mohammed Kindle.  He sees Dr. work for pulmonology.  His cardiologist is Dr. Percival Spanish.  His ascending aortic aneurysm is currently at 4.7 cm but that was on a CT scan last year.  The most recent CT of the chest was in October 2019 which showed bronchiectasis ascending thoracic aortic aneurysm at 4.7 cm left main and three-vessel coronary artery atherosclerosis small hiatal hernia and severe T8 vertebral compression fracture that is stable since March 2019.  Patient is pleasant and alert and does see multiple specialists and tries to keep up with all this.  His next visit with the vascular surgeon cardiothoracic surgeon will be when he gets his next CT of the chest which will be next fall.  He also follows up with the urologist on a fairly regular basis as well as the cardiologist and the pulmonologist.  He does his best possible keeping up with these appointments.  The right arm numbness is stable and is secondary to neck problems and he has seen neurosurgery about this but not recently.  He is  getting physical therapy to help his balance issues.  He has his ongoing chest congestion and has prescriptions available from the pulmonologist to take when the congestion gets worse for his bronchiectasis.  His GI complaints are stable his breathing issues are stable currently and he is not having any chest pain.  He is passing his water well and trying to head off having to have another TURP.     Patient Active Problem List   Diagnosis Date Noted  . Bronchiectasis with (acute) exacerbation ([place]) 11/27/2017  . COPD with acute exacerbation ([place]) 11/07/2017  . Peripheral vascular insufficiency ([place]) 07/19/2017  . Aneurysmal dilatation ([place]) 07/19/2017  . Chronic cough 04/28/2017  . Chronic bronchitis ([place]) 03/23/2017  . Aortic atherosclerosis ([place]) 11/21/2016  . Thoracic ascending aortic aneurysm ([place]) 08/10/2016  . Atherosclerosis of native coronary artery of native heart without angina pectoris 08/10/2016  . Gastritis 05/04/2016  . Hiatal hernia 05/04/2016  . Abnormal chest x-ray 03/28/2016  . Esophageal stricture   . Non-intractable vomiting   . Abdominal pain, epigastric   . Dysphagia   . Atrial fibrillation with rapid ventricular response ([place])   . Elevated troponin   . Atrial fibrillation with RVR ([place]) 06/12/2015  . Pneumonia 06/12/2015  . Leukocytosis 06/12/2015  . Clot retention of urine 06/11/2015  . BPH (benign prostatic hypertrophy) with urinary obstruction 06/11/2015  . Postoperative anemia due to acute blood loss 06/11/2015  . Hematuria 06/10/2015  . Neck pain, bilateral  12/31/2013  . Neuropathy 12/31/2013  . Vitamin B 12 deficiency 12/31/2013  . Abnormality of gait 06/07/2013  . Esophageal ulcer with bleeding 04/20/2013  . S/P left and ritght THA, AA 07/21/2011  . Special screening for malignant neoplasms, colon 01/03/2011  . Personal history of colonic polyps 01/03/2011  . COPD GOLD I 08/24/2010  . GI BLEED 11/17/2009  . ESOPHAGEAL REFLUX 08/25/2009  . BLOOD  IN STOOL-MELENA 08/25/2009  . COLONIC POLYPS 07/17/2008  . ANEMIA 07/17/2008  . HYPOTENSION 07/17/2008  . Gastroesophageal reflux disease with esophagitis 07/17/2008  . DIVERTICULOSIS, MILD 07/17/2008  . DEGENERATIVE JOINT DISEASE 07/17/2008  . Osteoporosis 07/17/2008  . BPH (benign prostatic hyperplasia) 07/17/2008  . Hyperprolactinemia ([place]) 10/24/2007  . PITUITARY ADENOMA 06/25/2007  . Hypothyroidism 06/25/2007  . Hyperlipidemia LDL goal <70 06/22/2007  . Coronary atherosclerosis 06/22/2007   Outpatient Encounter Medications as of 05/04/2018  Medication Sig  . albuterol (PROVENTIL HFA;VENTOLIN HFA) 108 (90 Base) MCG/ACT inhaler Inhale 2 puffs into the lungs every 6 (six) hours as needed for wheezing or shortness of breath.  . bisacodyl (DULCOLAX) 10 MG suppository Place 10 mg rectally as needed for moderate constipation.  . bromocriptine (PARLODEL) 2.5 MG tablet TAKE  (1)  TABLET TWICE A DAY.  . budesonide-formoterol (SYMBICORT) 160-4.5 MCG/ACT inhaler Inhale 2 puffs into the lungs 2 (two) times daily.  . busPIRone (BUSPAR) 15 MG tablet Take 1 tablet (15 mg total) by mouth 2 (two) times daily as needed.  . calcium carbonate (OSCAL) 1500 (600 Ca) MG TABS tablet Take 600 mg of elemental calcium by mouth daily with breakfast.   . cholecalciferol (VITAMIN D) 1000 UNITS tablet Take 1,000 Units by mouth daily.   Marland Kitchen diltiazem (CARDIZEM CD) 120 MG 24 hr capsule TAKE (1) CAPSULE DAILY  . ferrous sulfate 325 (65 FE) MG tablet Take 325 mg by mouth daily with breakfast.   . guaiFENesin (MUCINEX) 600 MG 12 hr tablet Take 600 mg by mouth daily.   Marland Kitchen levalbuterol (XOPENEX) 0.63 MG/3ML nebulizer solution Take 3 mLs (0.63 mg total) by nebulization every 6 (six) hours as needed for wheezing or shortness of breath.  . levothyroxine (SYNTHROID, LEVOTHROID) 50 MCG tablet TAKE 1 TABLET IN MORNING  . magnesium oxide (MAG-OX) 400 (241.3 Mg) MG tablet Take 400 mg by mouth every other day.  .  mometasone-formoterol (DULERA) 200-5 MCG/ACT AERO Inhale 2 puffs into the lungs 2 (two) times daily.  . naphazoline-pheniramine (NAPHCON-A) 0.025-0.3 % ophthalmic solution Place 1 drop into both eyes 2 (two) times daily as needed for irritation or allergies.   . nitroGLYCERIN (NITROSTAT) 0.4 MG SL tablet Place 1 tablet (0.4 mg total) under the tongue every 5 (five) minutes as needed for chest pain.  . pantoprazole (PROTONIX) 40 MG tablet TAKE (1) TABLET TWICE A DAY BEFORE MEALS.  Marland Kitchen polyethylene glycol powder (GLYCOLAX/MIRALAX) powder DISSOLVE 17 GM SCOOP IN 8 OZ. OF WATER AND DRINK ONCE DAILY AS NEEDEDFOR CONSTIPATION  . Respiratory Therapy Supplies (FLUTTER) DEVI Twice a day and prn as needed, may increase if feeling worse  . sucralfate (CARAFATE) 1 g tablet TAKE (1) TABLET FOUR TIMES DAILY AS NEEDED FOR GI UPSET  . vitamin B-12 (CYANOCOBALAMIN) 1000 MCG tablet Take 1,000 mcg by mouth daily.  . vitamin E (VITAMIN E) 400 UNIT capsule Take 400 Units by mouth daily.  . [DISCONTINUED] mometasone-formoterol (DULERA) 200-5 MCG/ACT AERO Inhale 2 puffs into the lungs 2 (two) times daily.  . [DISCONTINUED] Respiratory Therapy Supplies (FLUTTER) DEVI 1 Device by Does  not apply route as needed.   No facility-administered encounter medications on file as of 05/04/2018.      Review of Systems  Constitutional: Negative.   HENT: Positive for congestion (on-going secreations (uses a flutter valve- from pulmonary)).   Eyes: Negative.   Respiratory: Negative.   Cardiovascular: Negative.   Gastrointestinal: Negative.   Endocrine: Negative.   Genitourinary: Negative.   Musculoskeletal: Positive for myalgias (cramps in legs frequently ).  Skin: Negative.   Allergic/Immunologic: Negative.   Neurological: Positive for numbness (right arm (when laying down flat)).       Off balance more ofter  Hematological: Negative.   Psychiatric/Behavioral: Negative.        Objective:   Physical Exam Vitals signs and  nursing note reviewed.  Constitutional:      General: He is not in acute distress.    Appearance: Normal appearance. He is well-developed and normal weight. He is not ill-appearing.     Comments: Patient is pleasant and alert and doing extremely well to be living by himself with all of the medical issues that he has to deal with.  His mind is good and he keeps up with the things that need to be kept up with on a regular basis.  HENT:     Head: Normocephalic and atraumatic.     Right Ear: Tympanic membrane, ear canal and external ear normal. There is no impacted cerumen.     Left Ear: Tympanic membrane, ear canal and external ear normal. There is no impacted cerumen.     Nose: Nose normal. No congestion.     Mouth/Throat:     Mouth: Mucous membranes are moist.     Pharynx: Oropharynx is clear. No oropharyngeal exudate.  Eyes:     General: No scleral icterus.       Right eye: No discharge.        Left eye: No discharge.     Extraocular Movements: Extraocular movements intact.     Conjunctiva/sclera: Conjunctivae normal.     Pupils: Pupils are equal, round, and reactive to light.  Neck:     Musculoskeletal: Normal range of motion and neck supple.     Thyroid: No thyromegaly.     Vascular: No carotid bruit.     Trachea: No tracheal deviation.     Comments: No bruits thyromegaly or anterior cervical adenopathy Cardiovascular:     Rate and Rhythm: Normal rate and regular rhythm.     Heart sounds: Normal heart sounds. No murmur. No friction rub.     Comments: The heart is regular at 60/min with no pedal edema but diminished pedal pulses bilaterally. Pulmonary:     Effort: Pulmonary effort is normal. No respiratory distress.     Breath sounds: Normal breath sounds. No wheezing or rales.     Comments: Actually very clear today with minimal congestion or rhonchi even fairly clear with coughing.  I encouraged him to do deep breathing uses Mucinex regularly and drink plenty of water. Chest:      Chest wall: No tenderness.  Abdominal:     General: Abdomen is flat. Bowel sounds are normal.     Palpations: Abdomen is soft. There is no mass.     Tenderness: There is no abdominal tenderness.     Hernia: No hernia is present.     Comments: No masses tenderness organ enlargement bruits or hernia detected.  Genitourinary:    Comments: Patient sees urologist regularly. Musculoskeletal: Normal range of motion.  General: Deformity present. No tenderness.     Right lower leg: No edema.     Left lower leg: No edema.     Comments: Patient has pes planus and also has thoracic kyphosis.  Lymphadenopathy:     Cervical: No cervical adenopathy.  Skin:    General: Skin is warm and dry.     Findings: No rash.  Neurological:     General: No focal deficit present.     Mental Status: He is alert and oriented to person, place, and time. Mental status is at baseline.     Cranial Nerves: No cranial nerve deficit.     Gait: Gait normal.     Deep Tendon Reflexes: Reflexes are normal and symmetric. Reflexes normal.     Comments: Lower extremity reflexes that are equal bilaterally in the lower extremity.  Psychiatric:        Mood and Affect: Mood normal.        Behavior: Behavior normal.        Thought Content: Thought content normal.        Judgment: Judgment normal.     Comments: Mood affect and behavior are normal for this patient    BP 126/70 (BP Location: Left Arm)   Pulse 60   Temp (!) 96.8 F (36 C) (Oral)   Ht _0  (1.6 m)   Wt 155 lb (70.3 kg)   BMI 27.46 kg/m         Assessment & Plan:  1. Hypothyroidism due to acquired atrophy of thyroid -Continue with current treatment pending results of lab work - CBC with Differential/Platelet - Thyroid Panel With TSH  2. Gastroesophageal reflux disease, esophagitis presence not specified -Continue with Protonix indefinitely and avoid all NSAIDs - CBC with Differential/Platelet - Hepatic function panel  3. Vitamin D  deficiency -Continue with vitamin D replacement pending results of lab work - CBC with Differential/Platelet - VITAMIN D 25 Hydroxy (Vit-D Deficiency, Fractures)  4. Atrial fibrillation with RVR ([facility]) -Follow-up with Dr. Susy Manor as planned - CBC with Differential/Platelet  5. Thoracic ascending aortic aneurysm [facility]) -Follow-up with cardiothoracic surgeon as planned and get repeat CT scan in October of this year because of aneurysm that is 4.7 cm in width. - BMP8+EGFR - CBC with Differential/Platelet - Lipid panel  6. Aortic atherosclerosis ([place]) -Continue with current treatment and aggressive therapeutic lifestyle changes - BMP8+EGFR - CBC with Differential/Platelet - Lipid panel  7. Hyperlipidemia LDL goal <70 -Continue with current treatment and aggressive therapeutic lifestyle changes pending results of lab work - CBC with Differential/Platelet - Lipid panel  8. Kyphosis, unspecified kyphosis type, unspecified spinal region -Patient will continue to walk regularly take Tylenol if needed for pain and make sure that he remains careful not to put himself at risk for falling  9. Hyperprolactinemia ([place]) -Follow-up with neurosurgery as needed  10. Peripheral vascular insufficiency ([facility]) -Walk regularly  11. Chronic bronchitis, unspecified chronic bronchitis type ([place]) -Follow-up with Dr. Shyrl Numbers as planned  12. Ascending aortic aneurysm ([facility]) -Follow-up with Dr. Mohammed Kindle as planned and get repeat CT scan this coming fall  Patient Instructions                       Medicare Annual Wellness Visit  [facility] and the medical providers at [facility] strive to bring you the best medical care.  In doing so we not only want to address your current medical conditions and concerns but also to  detect new conditions early and prevent illness, disease and health-related problems.    Medicare offers a yearly Wellness Visit which allows our clinical staff to assess  your need for preventative services including immunizations, lifestyle education, counseling to decrease risk of preventable diseases and screening for fall risk and other medical concerns.    This visit is provided free of charge (no copay) for all Medicare recipients. The clinical pharmacists at [facility] have begun to conduct these Wellness Visits which will also include a thorough review of all your medications.    As you primary medical provider recommend that you make an appointment for your Annual Wellness Visit if you have not done so already this year.  You may set up this appointment before you leave today or you may call back ([phone]) and schedule an appointment.  Please make sure when you call that you mention that you are scheduling your Annual Wellness Visit with the clinical pharmacist so that the appointment may be made for the proper length of time.     Continue current medications. Continue good therapeutic lifestyle changes which include good diet and exercise. Fall precautions discussed with patient. If an FOBT was given today- please return it to our front desk. If you are over 28 years old - you may need Prevnar 18 or the adult Pneumonia vaccine.  **Flu shots are available--- please call and schedule a FLU-CLINIC appointment**  After your visit with [place] today you will receive a survey in the mail or online from [employer] regarding your care with [place]. Please take a moment to fill this out. Your feedback is very important to [place] as you can help [place] better understand your patient needs as well as improve your experience and satisfaction. WE CARE ABOUT YOU!!!   Patient should continue to follow-up with his pulmonologist urologist cardiologist and vascular surgeon as recommended by them. He should try to drink more water and more fluids as this would help the leg cramps He should be careful daily to not put himself at risk for falling He should try to  walk regularly as this will be good for balance and strength and continue with rehab as he is doing. Do not forget to get repeat CT scan of chest this October  Arrie Senate MD

## 2018-05-04 NOTE — Patient Instructions (Addendum)
Medicare Annual Wellness Visit  [facility] and the medical providers at [facility] strive to bring you the best medical care.  In doing so we not only want to address your current medical conditions and concerns but also to detect new conditions early and prevent illness, disease and health-related problems.    Medicare offers a yearly Wellness Visit which allows our clinical staff to assess your need for preventative services including immunizations, lifestyle education, counseling to decrease risk of preventable diseases and screening for fall risk and other medical concerns.    This visit is provided free of charge (no copay) for all Medicare recipients. The clinical pharmacists at [facility] have begun to conduct these Wellness Visits which will also include a thorough review of all your medications.    As you primary medical provider recommend that you make an appointment for your Annual Wellness Visit if you have not done so already this year.  You may set up this appointment before you leave today or you may call back ([phone]) and schedule an appointment.  Please make sure when you call that you mention that you are scheduling your Annual Wellness Visit with the clinical pharmacist so that the appointment may be made for the proper length of time.     Continue current medications. Continue good therapeutic lifestyle changes which include good diet and exercise. Fall precautions discussed with patient. If an FOBT was given today- please return it to our front desk. If you are over 51 years old - you may need Prevnar 76 or the adult Pneumonia vaccine.  **Flu shots are available--- please call and schedule a FLU-CLINIC appointment**  After your visit with [place] today you will receive a survey in the mail or online from [employer] regarding your care with [place]. Please take a moment to fill this out. Your feedback is very  important to [place] as you can help [place] better understand your patient needs as well as improve your experience and satisfaction. WE CARE ABOUT YOU!!!   Patient should continue to follow-up with his pulmonologist urologist cardiologist and vascular surgeon as recommended by them. He should try to drink more water and more fluids as this would help the leg cramps He should be careful daily to not put himself at risk for falling He should try to walk regularly as this will be good for balance and strength and continue with rehab as he is doing. Do not forget to get repeat CT scan of chest this October

## 2018-05-05 LAB — CBC WITH DIFFERENTIAL/PLATELET
Basophils Absolute: 0.2 10*3/uL (ref 0.0–0.2)
Basos: 2 %
EOS (ABSOLUTE): 0.2 10*3/uL (ref 0.0–0.4)
Eos: 3 %
Hematocrit: 36.5 % — ABNORMAL LOW (ref 37.5–51.0)
Hemoglobin: 12.2 g/dL — ABNORMAL LOW (ref 13.0–17.7)
Immature Grans (Abs): 0.4 10*3/uL — ABNORMAL HIGH (ref 0.0–0.1)
Immature Granulocytes: 5 %
Lymphocytes Absolute: 1.1 10*3/uL (ref 0.7–3.1)
Lymphs: 12 %
MCH: 31.9 pg (ref 26.6–33.0)
MCHC: 33.4 g/dL (ref 31.5–35.7)
MCV: 96 fL (ref 79–97)
Monocytes Absolute: 1.2 10*3/uL — ABNORMAL HIGH (ref 0.1–0.9)
Monocytes: 13 %
Neutrophils Absolute: 5.8 10*3/uL (ref 1.4–7.0)
Neutrophils: 65 %
Platelets: 247 10*3/uL (ref 150–450)
RBC: 3.82 x10E6/uL — ABNORMAL LOW (ref 4.14–5.80)
RDW: 13.8 % (ref 11.6–15.4)
WBC: 8.9 10*3/uL (ref 3.4–10.8)

## 2018-05-05 LAB — BMP8+EGFR
BUN/Creatinine Ratio: 17 (ref 10–24)
BUN: 15 mg/dL (ref 8–27)
CO2: 22 mmol/L (ref 20–29)
Calcium: 8.9 mg/dL (ref 8.6–10.2)
Chloride: 104 mmol/L (ref 96–106)
Creatinine, Ser: 0.87 mg/dL (ref 0.76–1.27)
GFR calc Af Amer: 92 mL/min/{1.73_m2} (ref >59)
GFR calc non Af Amer: 80 mL/min/{1.73_m2} (ref >59)
Glucose: 90 mg/dL (ref 65–99)
Potassium: 4.1 mmol/L (ref 3.5–5.2)
Sodium: 141 mmol/L (ref 134–144)

## 2018-05-05 LAB — HEPATIC FUNCTION PANEL
ALT: 15 IU/L (ref 0–44)
AST: 20 IU/L (ref 0–40)
Albumin: 3.6 g/dL (ref 3.6–4.6)
Alkaline Phosphatase: 84 IU/L (ref 39–117)
Bilirubin Total: 0.3 mg/dL (ref 0.0–1.2)
Bilirubin, Direct: 0.1 mg/dL (ref 0.00–0.40)
Total Protein: 6.3 g/dL (ref 6.0–8.5)

## 2018-05-05 LAB — LIPID PANEL
Chol/HDL Ratio: 3.1 ratio (ref 0.0–5.0)
Cholesterol, Total: 165 mg/dL (ref 100–199)
HDL: 53 mg/dL (ref >39)
LDL Calculated: 101 mg/dL — ABNORMAL HIGH (ref 0–99)
Triglycerides: 55 mg/dL (ref 0–149)
VLDL Cholesterol Cal: 11 mg/dL (ref 5–40)

## 2018-05-05 LAB — THYROID PANEL WITH TSH
Free Thyroxine Index: 1.3 (ref 1.2–4.9)
T3 Uptake Ratio: 25 % (ref 24–39)
T4, Total: 5.1 ug/dL (ref 4.5–12.0)
TSH: 3.06 u[IU]/mL (ref 0.450–4.500)

## 2018-05-05 LAB — VITAMIN D 25 HYDROXY (VIT D DEFICIENCY, FRACTURES): Vit D, 25-Hydroxy: 41.6 ng/mL (ref 30.0–100.0)

## 2018-05-07 ENCOUNTER — Other Ambulatory Visit: Payer: Self-pay

## 2018-05-07 ENCOUNTER — Encounter: Payer: Self-pay | Admitting: Endocrinology

## 2018-05-07 ENCOUNTER — Ambulatory Visit: Payer: Medicare Other | Admitting: Endocrinology

## 2018-05-07 ENCOUNTER — Ambulatory Visit: Payer: Medicare Other | Admitting: Internal Medicine

## 2018-05-07 VITALS — BP 104/62 | HR 70 | Ht 63.0 in | Wt 156.0 lb

## 2018-05-07 DIAGNOSIS — E221 Hyperprolactinemia: Secondary | ICD-10-CM

## 2018-05-07 LAB — BASIC METABOLIC PANEL
BUN: 18 mg/dL (ref 6–23)
CO2: 26 mEq/L (ref 19–32)
Calcium: 9.2 mg/dL (ref 8.4–10.5)
Chloride: 106 mEq/L (ref 96–112)
Creatinine, Ser: 0.97 mg/dL (ref 0.40–1.50)
GFR: 73.8 mL/min (ref >60.00)
Glucose, Bld: 74 mg/dL (ref 70–99)
Potassium: 4.2 mEq/L (ref 3.5–5.1)
Sodium: 140 mEq/L (ref 135–145)

## 2018-05-07 NOTE — Progress Notes (Signed)
HPI The patient presents for followup of his known coronary disease. He had a negative stress perfusion study in 2017.  The patient had recent palpitations.  He wore a Holter.  I reviewed these records for this visit.  there were PVCs, non sustained V tach and atrial tach and PVCs/PACs.   He had this placed because he was talking about some palpitations when he was being evaluated for hernia.  He wore the monitor and there was no sustained dysrhythmias or symptoms.  He does occasionally feel his heart rate skipping but he is actually not describing this now.  He is not had any presyncope or syncope.  It is not having any chest pressure, neck or arm discomfort.  He said no weight gain or edema.  He has an unsteady gait and walks with a cane.  Allergies  Allergen Reactions  . Betadine [Povidone Iodine] Other (See Comments)    Reaction:  Blisters   . Lortab [Hydrocodone-Acetaminophen] Nausea And Vomiting  . Penicillins Other (See Comments)    Reaction:  Lightheadedness  Has patient had a PCN reaction causing immediate rash, facial/tongue/throat swelling, SOB or lightheadedness with hypotension: Yes Has patient had a PCN reaction causing severe rash involving mucus membranes or skin necrosis: No Has patient had a PCN reaction that required hospitalization No Has patient had a PCN reaction occurring within the last 10 years: No If all of the above answers are "NO", then may proceed with Cephalosporin use.  Marland Kitchen Zetia [Ezetimibe] Other (See Comments)    Reaction:  Muscle weakness   . Zocor [Simvastatin] Nausea Only and Other (See Comments)    Reaction:  Muscle weakness     Current Outpatient Medications  Medication Sig Dispense Refill  . albuterol (PROVENTIL HFA;VENTOLIN HFA) 108 (90 Base) MCG/ACT inhaler Inhale 2 puffs into the lungs every 6 (six) hours as needed for wheezing or shortness of breath. 18 g 2  . bisacodyl (DULCOLAX) 10 MG suppository Place 10 mg rectally as needed for moderate  constipation.    . bromocriptine (PARLODEL) 2.5 MG tablet TAKE  (1)  TABLET TWICE A DAY. 60 tablet 3  . budesonide-formoterol (SYMBICORT) 160-4.5 MCG/ACT inhaler Inhale 2 puffs into the lungs 2 (two) times daily. 1 Inhaler 11  . busPIRone (BUSPAR) 15 MG tablet Take 1 tablet (15 mg total) by mouth 2 (two) times daily as needed. 60 tablet 1  . calcium carbonate (OSCAL) 1500 (600 Ca) MG TABS tablet Take 600 mg of elemental calcium by mouth daily with breakfast.     . cholecalciferol (VITAMIN D) 1000 UNITS tablet Take 1,000 Units by mouth daily.     Marland Kitchen diltiazem (CARDIZEM CD) 120 MG 24 hr capsule TAKE (1) CAPSULE DAILY 90 capsule 0  . ferrous sulfate 325 (65 FE) MG tablet Take 325 mg by mouth daily with breakfast.     . guaiFENesin (MUCINEX) 600 MG 12 hr tablet Take 600 mg by mouth daily.     Marland Kitchen levalbuterol (XOPENEX) 0.63 MG/3ML nebulizer solution Take 3 mLs (0.63 mg total) by nebulization every 6 (six) hours as needed for wheezing or shortness of breath. 72 mL 1  . levothyroxine (SYNTHROID, LEVOTHROID) 50 MCG tablet TAKE 1 TABLET IN MORNING 30 tablet 0  . magnesium oxide (MAG-OX) 400 (241.3 Mg) MG tablet Take 400 mg by mouth every other day.    . naphazoline-pheniramine (NAPHCON-A) 0.025-0.3 % ophthalmic solution Place 1 drop into both eyes 2 (two) times daily as needed for irritation or allergies.     Marland Kitchen  nitroGLYCERIN (NITROSTAT) 0.4 MG SL tablet Place 1 tablet (0.4 mg total) under the tongue every 5 (five) minutes as needed for chest pain. 25 tablet 2  . pantoprazole (PROTONIX) 40 MG tablet TAKE (1) TABLET TWICE A DAY BEFORE MEALS. 180 tablet 1  . polyethylene glycol powder (GLYCOLAX/MIRALAX) powder DISSOLVE 17 GM SCOOP IN 8 OZ. OF WATER AND DRINK ONCE DAILY AS NEEDEDFOR CONSTIPATION 527 g 0  . sucralfate (CARAFATE) 1 g tablet TAKE (1) TABLET FOUR TIMES DAILY AS NEEDED FOR GI UPSET 120 tablet 0  . vitamin B-12 (CYANOCOBALAMIN) 1000 MCG tablet Take 1,000 mcg by mouth daily.    . vitamin E (VITAMIN E)  400 UNIT capsule Take 400 Units by mouth daily.    Marland Kitchen Respiratory Therapy Supplies (FLUTTER) DEVI Twice a day and prn as needed, may increase if feeling worse 1 each 0   No current facility-administered medications for this visit.     Past Medical History:  Diagnosis Date  . Abnormality of gait 06/07/2013  . Anemia   . Aneurysm ([place])    on assending aorta, currently watching it, Dr Cyndia Bent  . Anxiety   . Arthritis   . Asthma   . Atrial fibrillation ([place])   . Barrett's esophagus   . CAD (coronary artery disease) 03/2006   3.0 x 20 mm TAXUS Perseus DES to the LAD; 01/2007  L main 30%, oLAD 50%, pLAD stent ok, CFX 80%, OM 60%, pRCA 60%, mRCA 70%, oPDA 90%; med rx   . Cancer ([place])    skin cancer   . Cataract    bilateral removal of cateracts  . Complication of anesthesia     no issues,but pt prefers spinal due to Pulmonary problems  . COPD (chronic obstructive pulmonary disease) ([place])    oxygen  on standby in home.  . Depression   . Diverticulosis   . Emphysema   . Enlarged prostate with urinary retention   . Esophageal stenosis   . GERD (gastroesophageal reflux disease)   . Hiatal hernia   . Hypothyroidism   . Neuropathy   . Osteoporosis   . Pituitary macroadenoma ([place])   . Restless legs syndrome (RLS)   . UGIB (upper gastrointestinal bleed) 03/2013   EGD w/ large ulcer at GE junction    Past Surgical History:  Procedure Laterality Date  . ABDOMINAL HERNIA REPAIR   2008  . CARDIAC CATHETERIZATION  01/2007   L main 30%, oLAD 50%,  pLAD stent ok, CFX 80%, OM 60%, pRCA 60%, mRCA 70%, oPDA 90%; med rx  . cataract extraction both eyes    . COLONOSCOPY    . CORONARY STENT PLACEMENT  03/2006   3.0 x 20 mm TAXUS Perseus DES to the LAD  . CYSTOSCOPY N/A 06/10/2015   Procedure: CYSTOSCOPY FULGRATION OF BLEEDING,  electovapor resection;  Surgeon: Irine Seal, MD;  [facility]: [facility];  Service: Urology;  Laterality: N/A;  . CYSTOSCOPY WITH INSERTION OF UROLIFT N/A 06/01/2015    Procedure: CYSTOSCOPY WITH INSERTION OF UROLIFT x4;  Surgeon: Irine Seal, MD;  [facility];  Service: Urology;  Laterality: N/A;  . ESOPHAGOGASTRODUODENOSCOPY N/A 04/20/2013   Procedure: ESOPHAGOGASTRODUODENOSCOPY (EGD);  Surgeon: Inda Castle, MD;  Location: [facility];  Service: Endoscopy;  Laterality: N/A;  . ESOPHAGOGASTRODUODENOSCOPY N/A 03/25/2016   Procedure: ESOPHAGOGASTRODUODENOSCOPY (EGD);  Surgeon: Irene Shipper, MD;  Location: [facility];  Service: Endoscopy;  Laterality: N/A;  . ESOPHAGOGASTRODUODENOSCOPY (EGD) WITH PROPOFOL N/A 10/13/2015   Procedure: ESOPHAGOGASTRODUODENOSCOPY (EGD) WITH PROPOFOL;  Surgeon: Jerene Bears, MD;  Location: [facility];  Service: Gastroenterology;  Laterality: N/A;  . FOOT SURGERY  1994 left, 2002 right foot   bilateral foot reconstruciton  . FOOT SURGERY     reconstruction of both feet- no retained hardware.  Marland Kitchen HIATAL HERNIA REPAIR  01-04-2008  . MELANOMA EXCISION  2019  . POLYPECTOMY    . PROSTATE SURGERY     x 2  . SAVORY DILATION N/A 03/25/2016   Procedure: SAVORY DILATION;  Surgeon: Irene Shipper, MD;  [facility];  Service: Endoscopy;  Laterality: N/A;  . TOTAL HIP ARTHROPLASTY  07/21/2011   Procedure: TOTAL HIP ARTHROPLASTY ANTERIOR APPROACH;  Surgeon: Mauri Pole, MD;  [facility];  Service: Orthopedics;  Laterality: Left;  . TOTAL HIP ARTHROPLASTY Right 09/07/2012   Procedure: RIGHT TOTAL HIP ARTHROPLASTY ANTERIOR APPROACH;  Surgeon: Mcarthur Rossetti, MD;  [facility];  Service: Orthopedics;  Laterality: Right;  . TRANSURETHRAL RESECTION OF BLADDER NECK N/A 11/04/2015   Procedure: RESECTION OF BLADDER NECK;  Surgeon: Cleon Gustin, MD;  [facility];  Service: Urology;  Laterality: N/A;  . TRANSURETHRAL RESECTION OF PROSTATE N/A 11/04/2015   Procedure: TRANSURETHRAL RESECTION OF THE PROSTATE (TURP); REMOVAL OF UROLIFT IMPLANTS X THREE;  Surgeon: Cleon Gustin, MD;  [facility];  Service:  Urology;  Laterality: N/A;  . UPPER GASTROINTESTINAL ENDOSCOPY  12/2013   Dr Hilarie Fredrickson, gastritis  . VIDEO BRONCHOSCOPY Bilateral 02/01/2017   Procedure: VIDEO BRONCHOSCOPY WITHOUT FLUORO;  Surgeon: Tanda Rockers, MD;  [facility];  Service: Cardiopulmonary;  Laterality: Bilateral;    ROS:   Positive for anxiety, balance.  Otherwise as stated in the HPI and negative for all other systems.  PHYSICAL EXAM BP 110/62   Pulse 76   Ht 5\' 3"  (1.6 m)   Wt 157 lb (71.2 kg)   BMI 27.81 kg/m   GENERAL:  Well appearing NECK:  No jugular venous distention, waveform within normal limits, carotid upstroke brisk and symmetric, no bruits, no thyromegaly LUNGS:  Clear to auscultation bilaterally CHEST:  Unremarkable HEART:  PMI not displaced or sustained,S1 and S2 within normal limits, no S3, no S4, no clicks, no rubs, no murmurs ABD:  Flat, positive bowel sounds normal in frequency in pitch, no bruits, no rebound, no guarding, no midline pulsatile mass, no hepatomegaly, no splenomegaly EXT:  2 plus pulses throughout, no edema, no cyanosis no clubbing   Lab Results  Component Value Date   CHOL 165 05/04/2018   TRIG 55 05/04/2018   HDL 53 05/04/2018   LDLCALC 101 (H) 05/04/2018     ASSESSMENT AND PLAN    ARRHYTHMIA -  The Holter was reviewed as above.   He has very few symptoms.  There were no sustained dysrhythmias.  He has had normal heart function on studies as above.  I do not think further cardiovascular testing or change in therapy is indicated.  He would prefer conservative therapy and says he is not symptomatic enough to warrant further treatment medically.  I did review his monitor specifically for this appointment.  This was placed by his primary provider.  CORONARY ARTERY DISEASE -   He has no angina.  No change in therapy.   ATRIAL FIB - He has had no recurrent atrial fib on the monitor.  There is been no evidence of atrial fibrillation.  Because of previous GI bleeding  is not an anticoagulation candidate without clear evidence of this.  HYPERLIPIDEMIA -  LDL was as above.  This is followed by Dr. Laurance Flatten.  ASCENDING AORTIC ANEURYSM- This is 4.7 on CT in 2019.  No imaging is indicated at this point.

## 2018-05-07 NOTE — Patient Instructions (Signed)
blood tests are requested for you today.  We'll let you know about the results.  Let's recheck the MRI.  you will receive a phone call, about a day and time for an appointment.  Please come back for a follow-up appointment in 1 year.

## 2018-05-07 NOTE — Progress Notes (Signed)
Subjective:    Patient ID: [name], male    DOB: [birth year], 85 y.o.   MRN: [id number]  HPI the status of at least 3 ongoing medical problems is addressed today: pituitary macroadenoma (found incidentally on MRI of the C-spine in 2009; he sees Dr Carloyn Manner for this; last MRI in 2016 was unchanged from 2014):  no headache.  This is a stable problem. hyperprolactinemia: (dx'ed 2009; he was rx'ed parlodel, but dosage has been limited by dizziness) lightheadedness is mild.  This is a stable problem.   hypothyroidism:  dry skin is unchanged.  He takes synthroid as rx'ed.  This is a stable problem.   Past Medical History:  Diagnosis Date  . Abnormality of gait 06/07/2013  . Anemia   . Aneurysm ([place])    on assending aorta, currently watching it, Dr Cyndia Bent  . Anxiety   . Arthritis   . Asthma   . Atrial fibrillation ([place])   . Barrett's esophagus   . CAD (coronary artery disease) 03/2006   3.0 x 20 mm TAXUS Perseus DES to the LAD; 01/2007  L main 30%, oLAD 50%, pLAD stent ok, CFX 80%, OM 60%, pRCA 60%, mRCA 70%, oPDA 90%; med rx   . Cancer ([place])    skin cancer   . Cataract    bilateral removal of cateracts  . Complication of anesthesia     no issues,but pt prefers spinal due to Pulmonary problems  . COPD (chronic obstructive pulmonary disease) ([place])    oxygen  on standby in home.  . Depression   . Diverticulosis   . Emphysema   . Enlarged prostate with urinary retention   . Esophageal stenosis   . GERD (gastroesophageal reflux disease)   . Hiatal hernia   . Hypothyroidism   . Neuropathy   . Osteoporosis   . Pituitary macroadenoma ([place])   . Restless legs syndrome (RLS)   . UGIB (upper gastrointestinal bleed) 03/2013   EGD w/ large ulcer at GE junction    Past Surgical History:  Procedure Laterality Date  . ABDOMINAL HERNIA REPAIR   2008  . CARDIAC CATHETERIZATION  01/2007   L main 30%, oLAD 50%,  pLAD stent ok, CFX 80%, OM 60%, pRCA 60%, mRCA 70%, oPDA 90%; med rx  .  cataract extraction both eyes    . COLONOSCOPY    . CORONARY STENT PLACEMENT  03/2006   3.0 x 20 mm TAXUS Perseus DES to the LAD  . CYSTOSCOPY N/A 06/10/2015   Procedure: CYSTOSCOPY FULGRATION OF BLEEDING,  electovapor resection;  Surgeon: Irine Seal, MD;  [facility];  Service: Urology;  Laterality: N/A;  . CYSTOSCOPY WITH INSERTION OF UROLIFT N/A 06/01/2015   Procedure: CYSTOSCOPY WITH INSERTION OF UROLIFT x4;  Surgeon: Irine Seal, MD;  [facility];  Service: Urology;  Laterality: N/A;  . ESOPHAGOGASTRODUODENOSCOPY N/A 04/20/2013   Procedure: ESOPHAGOGASTRODUODENOSCOPY (EGD);  Surgeon: Inda Castle, MD;  Location: [facility];  Service: Endoscopy;  Laterality: N/A;  . ESOPHAGOGASTRODUODENOSCOPY N/A 03/25/2016   Procedure: ESOPHAGOGASTRODUODENOSCOPY (EGD);  Surgeon: Irene Shipper, MD;  Location: [facility];  Service: Endoscopy;  Laterality: N/A;  . ESOPHAGOGASTRODUODENOSCOPY (EGD) WITH PROPOFOL N/A 10/13/2015   Procedure: ESOPHAGOGASTRODUODENOSCOPY (EGD) WITH PROPOFOL;  Surgeon: Jerene Bears, MD;  [facility];  Service: Gastroenterology;  Laterality: N/A;  . FOOT SURGERY  1994 left, 2002 right foot   bilateral foot reconstruciton  . FOOT SURGERY     reconstruction of both feet- no retained hardware.  Marland Kitchen  HIATAL HERNIA REPAIR  01-04-2008  . MELANOMA EXCISION  2019  . POLYPECTOMY    . PROSTATE SURGERY     x 2  . SAVORY DILATION N/A 03/25/2016   Procedure: SAVORY DILATION;  Surgeon: Irene Shipper, MD;  [facility];  Service: Endoscopy;  Laterality: N/A;  . TOTAL HIP ARTHROPLASTY  07/21/2011   Procedure: TOTAL HIP ARTHROPLASTY ANTERIOR APPROACH;  Surgeon: Mauri Pole, MD;  [facility];  Service: Orthopedics;  Laterality: Left;  . TOTAL HIP ARTHROPLASTY Right 09/07/2012   Procedure: RIGHT TOTAL HIP ARTHROPLASTY ANTERIOR APPROACH;  Surgeon: Mcarthur Rossetti, MD;  [facility];  Service: Orthopedics;  Laterality: Right;  . TRANSURETHRAL RESECTION OF  BLADDER NECK N/A 11/04/2015   Procedure: RESECTION OF BLADDER NECK;  Surgeon: Cleon Gustin, MD;  [facility];  Service: Urology;  Laterality: N/A;  . TRANSURETHRAL RESECTION OF PROSTATE N/A 11/04/2015   Procedure: TRANSURETHRAL RESECTION OF THE PROSTATE (TURP); REMOVAL OF UROLIFT IMPLANTS X THREE;  Surgeon: Cleon Gustin, MD;  Location[facility];  Service: Urology;  Laterality: N/A;  . UPPER GASTROINTESTINAL ENDOSCOPY  12/2013   Dr Hilarie Fredrickson, gastritis  . VIDEO BRONCHOSCOPY Bilateral 02/01/2017   Procedure: VIDEO BRONCHOSCOPY WITHOUT FLUORO;  Surgeon: Tanda Rockers, MD;  [facility];  Service: Cardiopulmonary;  Laterality: Bilateral;    Social History   Socioeconomic History  . Marital status: Widowed    Spouse name: Not on file  . Number of children: 2  . Years of education: 60  . Highest education level: Associate degree: occupational, Hotel manager, or vocational program  Occupational History  . Occupation: retired  Scientific laboratory technician  . Financial resource strain: Not hard at all  . Food insecurity:    Worry: Never true    Inability: Never true  . Transportation needs:    Medical: No    Non-medical: No  Tobacco Use  . Smoking status: Never Smoker  . Smokeless tobacco: Never Used  Substance and Sexual Activity  . Alcohol use: No  . Drug use: No  . Sexual activity: Not Currently    Birth control/protection: None  Lifestyle  . Physical activity:    Days per week: 0 days    Minutes per session: 0 min  . Stress: To some extent  Relationships  . Social connections:    Talks on phone: More than three times a week    Gets together: More than three times a week    Attends religious service: More than 4 times per year    Active member of club or organization: Yes    Attends meetings of clubs or organizations: More than 4 times per year    Relationship status: Widowed  . Intimate partner violence:    Fear of current or ex partner: No    Emotionally abused: No     Physically abused: No    Forced sexual activity: No  Other Topics Concern  . Not on file  Social History Narrative   Patient drinks caffeine a few times a week.   Patient is right handed.    Current Outpatient Medications on File Prior to Visit  Medication Sig Dispense Refill  . albuterol (PROVENTIL HFA;VENTOLIN HFA) 108 (90 Base) MCG/ACT inhaler Inhale 2 puffs into the lungs every 6 (six) hours as needed for wheezing or shortness of breath. 18 g 2  . bisacodyl (DULCOLAX) 10 MG suppository Place 10 mg rectally as needed for moderate constipation.    . bromocriptine (PARLODEL)  2.5 MG tablet TAKE  (1)  TABLET TWICE A DAY. 60 tablet 3  . budesonide-formoterol (SYMBICORT) 160-4.5 MCG/ACT inhaler Inhale 2 puffs into the lungs 2 (two) times daily. 1 Inhaler 11  . busPIRone (BUSPAR) 15 MG tablet Take 1 tablet (15 mg total) by mouth 2 (two) times daily as needed. 60 tablet 1  . calcium carbonate (OSCAL) 1500 (600 Ca) MG TABS tablet Take 600 mg of elemental calcium by mouth daily with breakfast.     . cholecalciferol (VITAMIN D) 1000 UNITS tablet Take 1,000 Units by mouth daily.     Marland Kitchen diltiazem (CARDIZEM CD) 120 MG 24 hr capsule TAKE (1) CAPSULE DAILY 90 capsule 0  . ferrous sulfate 325 (65 FE) MG tablet Take 325 mg by mouth daily with breakfast.     . guaiFENesin (MUCINEX) 600 MG 12 hr tablet Take 600 mg by mouth daily.     Marland Kitchen levalbuterol (XOPENEX) 0.63 MG/3ML nebulizer solution Take 3 mLs (0.63 mg total) by nebulization every 6 (six) hours as needed for wheezing or shortness of breath. 72 mL 1  . levothyroxine (SYNTHROID, LEVOTHROID) 50 MCG tablet TAKE 1 TABLET IN MORNING 30 tablet 0  . magnesium oxide (MAG-OX) 400 (241.3 Mg) MG tablet Take 400 mg by mouth every other day.    . naphazoline-pheniramine (NAPHCON-A) 0.025-0.3 % ophthalmic solution Place 1 drop into both eyes 2 (two) times daily as needed for irritation or allergies.     . nitroGLYCERIN (NITROSTAT) 0.4 MG SL tablet Place 1 tablet (0.4  mg total) under the tongue every 5 (five) minutes as needed for chest pain. 25 tablet 2  . pantoprazole (PROTONIX) 40 MG tablet TAKE (1) TABLET TWICE A DAY BEFORE MEALS. 180 tablet 1  . polyethylene glycol powder (GLYCOLAX/MIRALAX) powder DISSOLVE 17 GM SCOOP IN 8 OZ. OF WATER AND DRINK ONCE DAILY AS NEEDEDFOR CONSTIPATION 527 g 0  . Respiratory Therapy Supplies (FLUTTER) DEVI Twice a day and prn as needed, may increase if feeling worse 1 each 0  . sucralfate (CARAFATE) 1 g tablet TAKE (1) TABLET FOUR TIMES DAILY AS NEEDED FOR GI UPSET 120 tablet 0  . vitamin B-12 (CYANOCOBALAMIN) 1000 MCG tablet Take 1,000 mcg by mouth daily.    . vitamin E (VITAMIN E) 400 UNIT capsule Take 400 Units by mouth daily.     No current facility-administered medications on file prior to visit.     Allergies  Allergen Reactions  . Betadine [Povidone Iodine] Other (See Comments)    Reaction:  Blisters   . Lortab [Hydrocodone-Acetaminophen] Nausea And Vomiting  . Penicillins Other (See Comments)    Reaction:  Lightheadedness  Has patient had a PCN reaction causing immediate rash, facial/tongue/throat swelling, SOB or lightheadedness with hypotension: Yes Has patient had a PCN reaction causing severe rash involving mucus membranes or skin necrosis: No Has patient had a PCN reaction that required hospitalization No Has patient had a PCN reaction occurring within the last 10 years: No If all of the above answers are "NO", then may proceed with Cephalosporin use.  Marland Kitchen Zetia [Ezetimibe] Other (See Comments)    Reaction:  Muscle weakness   . Zocor [Simvastatin] Nausea Only and Other (See Comments)    Reaction:  Muscle weakness     Family History  Problem Relation Age of Onset  . Heart disease Sister   . Heart failure Father   . Other Mother        phebitis related to Omarian's birth, he was 47 weeks old  when she died  . Colon cancer Neg Hx   . Esophageal cancer Neg Hx   . Rectal cancer Neg Hx   . Stomach cancer Neg  Hx   . Colon polyps Neg Hx     BP 104/62 (BP Location: Right Arm, Patient Position: Sitting, Cuff Size: Normal)   Pulse 70   Ht 5\' 3"  (1.6 m)   Wt 156 lb (70.8 kg)   SpO2 90%   BMI 27.63 kg/m    Review of Systems Denies LOC and visual loss.     Objective:   Physical Exam VITAL SIGNS:  See vs page GENERAL: no distress NECK: There is no palpable thyroid enlargement.  No thyroid nodule is palpable.  No palpable lymphadenopathy at the anterior neck.    Lab Results  Component Value Date   TSH 3.060 05/04/2018   T4TOTAL 5.1 05/04/2018   Prolactin=138    Assessment & Plan:  Pituitary adenoma: due for recheck Hyperprolactinemia: persistent Hypothyroidism: well-replaced.    Patient Instructions  blood tests are requested for you today.  We'll let you know about the results.  Let's recheck the MRI.  you will receive a phone call, about a day and time for an appointment.  Please come back for a follow-up appointment in 1 year.

## 2018-05-08 LAB — PROLACTIN: Prolactin: 138.2 ng/mL — ABNORMAL HIGH (ref 2.0–18.0)

## 2018-05-09 ENCOUNTER — Encounter: Payer: Self-pay | Admitting: Endocrinology

## 2018-05-09 ENCOUNTER — Ambulatory Visit: Payer: Medicare Other | Admitting: Cardiology

## 2018-05-09 ENCOUNTER — Encounter: Payer: Self-pay | Admitting: Cardiology

## 2018-05-09 ENCOUNTER — Other Ambulatory Visit: Payer: Self-pay

## 2018-05-09 VITALS — BP 110/62 | HR 76 | Ht 63.0 in | Wt 157.0 lb

## 2018-05-09 DIAGNOSIS — R002 Palpitations: Secondary | ICD-10-CM | POA: Insufficient documentation

## 2018-05-09 DIAGNOSIS — I712 Thoracic aortic aneurysm, without rupture: Secondary | ICD-10-CM

## 2018-05-09 DIAGNOSIS — I7121 Aneurysm of the ascending aorta, without rupture: Secondary | ICD-10-CM

## 2018-05-09 DIAGNOSIS — I2510 Atherosclerotic heart disease of native coronary artery without angina pectoris: Secondary | ICD-10-CM | POA: Diagnosis not present

## 2018-05-09 DIAGNOSIS — E785 Hyperlipidemia, unspecified: Secondary | ICD-10-CM

## 2018-05-09 NOTE — Telephone Encounter (Signed)
Please call pt to provide with a status update re: the scheduling of his MRI

## 2018-05-09 NOTE — Patient Instructions (Signed)
Medication Instructions:  The current medical regimen is effective;  continue present plan and medications.  If you need a refill on your cardiac medications before your next appointment, please call your pharmacy.   Follow-Up: Follow up in 1 year with Dr. Hochrein in [name].  You will receive a letter in the mail 2 months before you are due.  Please call us when you receive this letter to schedule your follow up appointment.  Thank you for choosing [facility]!!     

## 2018-05-11 ENCOUNTER — Encounter: Payer: Self-pay | Admitting: Endocrinology

## 2018-05-17 ENCOUNTER — Other Ambulatory Visit: Payer: Self-pay

## 2018-05-17 ENCOUNTER — Ambulatory Visit (HOSPITAL_COMMUNITY)
Admission: RE | Admit: 2018-05-17 | Discharge: 2018-05-17 | Disposition: A | Discharge status: Home / Self Care | Payer: Medicare Other | Source: Ambulatory Visit | Attending: Endocrinology | Admitting: Endocrinology

## 2018-05-17 DIAGNOSIS — E221 Hyperprolactinemia: Secondary | ICD-10-CM | POA: Insufficient documentation

## 2018-05-17 DIAGNOSIS — D352 Benign neoplasm of pituitary gland: Secondary | ICD-10-CM | POA: Diagnosis not present

## 2018-05-17 MED ORDER — DIPHENHYDRAMINE HCL 25 MG PO CAPS
25.0000 mg | ORAL_CAPSULE | Freq: Once | ORAL | Status: AC
  Administered 2018-05-17: 25 mg via ORAL

## 2018-05-17 MED ORDER — GADOBUTROL 1 MMOL/ML IV SOLN
7.0000 mL | Freq: Once | INTRAVENOUS | Status: AC | PRN
  Administered 2018-05-17: 7 mL via INTRAVENOUS

## 2018-05-17 MED ORDER — DIPHENHYDRAMINE HCL 25 MG PO CAPS
ORAL_CAPSULE | ORAL | Status: AC
  Administered 2018-05-17: 25 mg via ORAL
  Filled 2018-05-17: qty 1

## 2018-05-17 NOTE — Sedation Documentation (Signed)
PT given 25 mg po benadryl at this time for hives after Gadavist was given during MRI. Dr. Thornton Papas gave verbal order to admin benadryl. PT's family coming to be with pt and transport home. PT keep at least 30 minutes to eval effect from benadryl.

## 2018-05-17 NOTE — Progress Notes (Signed)
Chart and office note reviewed in detail  > agree with a/p as outlined    

## 2018-05-18 ENCOUNTER — Encounter: Payer: Self-pay | Admitting: Endocrinology

## 2018-05-18 NOTE — Telephone Encounter (Signed)
Please advise 

## 2018-05-22 ENCOUNTER — Encounter: Payer: Self-pay | Admitting: Family Medicine

## 2018-05-23 ENCOUNTER — Other Ambulatory Visit: Payer: Self-pay | Admitting: Family Medicine

## 2018-05-23 ENCOUNTER — Other Ambulatory Visit: Payer: Self-pay | Admitting: Endocrinology

## 2018-05-27 ENCOUNTER — Encounter: Payer: Self-pay | Admitting: Family Medicine

## 2018-05-30 ENCOUNTER — Encounter: Payer: Self-pay | Admitting: Endocrinology

## 2018-05-30 DIAGNOSIS — J449 Chronic obstructive pulmonary disease, unspecified: Secondary | ICD-10-CM

## 2018-05-31 NOTE — Telephone Encounter (Signed)
I spent 10 minutes on this message, including review of test results.   please contact patient: Options: We could continue the same medication, because the MRI is unchanged, or: We could change to the other medication (called "cabergoline,"), because the prolactin is higher. Please think it over, and let me know.

## 2018-06-04 ENCOUNTER — Encounter: Payer: Self-pay | Admitting: Family Medicine

## 2018-06-07 NOTE — Addendum Note (Signed)
Encounter addended by: Annie Paras on: 06/07/2018 11:18 AM  Actions taken: Imaging Exam begun

## 2018-06-11 ENCOUNTER — Other Ambulatory Visit: Payer: Self-pay | Admitting: Family Medicine

## 2018-06-15 DIAGNOSIS — J449 Chronic obstructive pulmonary disease, unspecified: Secondary | ICD-10-CM | POA: Diagnosis not present

## 2018-06-15 DIAGNOSIS — J479 Bronchiectasis, uncomplicated: Secondary | ICD-10-CM | POA: Diagnosis not present

## 2018-06-18 ENCOUNTER — Encounter: Payer: Self-pay | Admitting: Family Medicine

## 2018-06-26 ENCOUNTER — Other Ambulatory Visit: Payer: Self-pay | Admitting: *Deleted

## 2018-06-26 ENCOUNTER — Encounter: Payer: Self-pay | Admitting: Family Medicine

## 2018-06-26 DIAGNOSIS — K439 Ventral hernia without obstruction or gangrene: Secondary | ICD-10-CM

## 2018-06-28 ENCOUNTER — Other Ambulatory Visit: Payer: Self-pay | Admitting: Endocrinology

## 2018-06-28 ENCOUNTER — Ambulatory Visit: Payer: Medicare Other | Admitting: Internal Medicine

## 2018-07-03 ENCOUNTER — Other Ambulatory Visit: Payer: Self-pay | Admitting: *Deleted

## 2018-07-04 ENCOUNTER — Encounter: Payer: Self-pay | Admitting: Family Medicine

## 2018-07-05 ENCOUNTER — Ambulatory Visit: Payer: Medicare Other | Admitting: General Surgery

## 2018-07-10 DIAGNOSIS — K439 Ventral hernia without obstruction or gangrene: Secondary | ICD-10-CM | POA: Diagnosis not present

## 2018-07-31 ENCOUNTER — Other Ambulatory Visit: Payer: Self-pay | Admitting: Endocrinology

## 2018-08-02 ENCOUNTER — Encounter: Payer: Self-pay | Admitting: Internal Medicine

## 2018-08-02 ENCOUNTER — Ambulatory Visit (INDEPENDENT_AMBULATORY_CARE_PROVIDER_SITE_OTHER): Payer: Medicare Other | Admitting: Internal Medicine

## 2018-08-02 ENCOUNTER — Other Ambulatory Visit: Payer: Self-pay

## 2018-08-02 ENCOUNTER — Ambulatory Visit: Payer: Medicare Other | Admitting: Internal Medicine

## 2018-08-02 VITALS — BP 108/60 | HR 76 | Temp 97.8°F | Ht 63.0 in | Wt 154.4 lb

## 2018-08-02 DIAGNOSIS — J471 Bronchiectasis with (acute) exacerbation: Secondary | ICD-10-CM | POA: Diagnosis not present

## 2018-08-02 DIAGNOSIS — J449 Chronic obstructive pulmonary disease, unspecified: Secondary | ICD-10-CM | POA: Diagnosis not present

## 2018-08-02 MED ORDER — CIPROFLOXACIN HCL 500 MG PO TABS: 500.0000 mg | ORAL_TABLET | Freq: Two times a day (BID) | ORAL | 11 refills | Status: DC

## 2018-08-02 MED ORDER — BUDESONIDE-FORMOTEROL FUMARATE 160-4.5 MCG/ACT IN AERO: 2.0000 | INHALATION_SPRAY | Freq: Two times a day (BID) | RESPIRATORY_TRACT | 1 refills | Status: DC

## 2018-08-02 NOTE — Progress Notes (Signed)
Subjective:   Patient ID: [name], male    DOB: 25-Jan-1933   MRN: [id number]    Brief patient profile:  85   yowm quit smoking completely around [place] (relatively light) with cough and short of breath eval by  Dr [name]  Then [name] dx of copd/ cb  pft's showed fev1 116% 02/2010 though ratio 66 c/w GOLD I criteria with confirmed bronchiectasis in 11/2017      History of Present Illness  07/05/2010 ov cc recurrent pna's since June 2011 cc not back to baseline in terms of activities he enjoyed in May, for example  Could do some yardwork  and rarely  Needed saba and no need for any maint rx,   but on  spriva for sob and still frequent tightness generalized front more than back,  Equal both sides  low grade fever and mucus gets thick and yellow> admit to [facility] April 24-27 by [facility] dx of ? Pna.   No sob at rest.   Continue protonix 40 mg  Take 30-60 min before first meal of the day and Pepcid 20 mg at bedtime GERD (REFLUX) diet Try symbicort 160 Take 2 puffs bid and work on hfa technique     01/31/2017  f/u ov/[name] re:  Copd 1/ chronic bronchitis / ? Prev aspiraton with abnormal cT chest  ? obst [facility]  Chief Complaint  Patient presents with  . Follow-up    Pt states he has had "a touch of PNA". He has been coughing since the beginning on Nov 2018.  He has taken doxy and levaquin.  He is coughing up greyish white sputum.  He has an albuterol inhaler that he rarely uses.   swallowing better now but still very congested cough esp since early nov 2018  Not using flutter valve as rec  hfa has improved but not yet adequate Still some hb/ sore throat symptoms despite reporting ppi bid  Also still some noct cough sev hours p lie down despite using 6 inch blocks rec FOB 02/01/17> tenacious secretions> nl flora/ no tb, no fungus on culture    04/28/2017  f/u ov/[name] re: f/u COPD / cb abn ct again Chief Complaint  Patient presents with  . Acute Visit    Increased cough x 10 days- prod with white to  grey sputum. He started on Doxy per Dr [name]- has one dose remaining. He states his breathing is unchanged since his last visit. He rarely uses his albuterol inhaler.   Had CT Chest done 04/26/17.    did better p first few days p fob until the end Dec nasty mucus, more sob > doxy then added zpak then better again then worse again last week if Feb 2019 restarted doxy and now on day #  9/10 on  ct chest 04/26/17 obst again on w/u for TAA Doe = MMRC1 = can walk nl pace, flat grade, can't hurry or go uphills or steps s sob   Sleeps ok but quit a bit of am congestion /mucus still grey   rec Please see patient coordinator before you leave today  to schedule sinus CT> neg  For nasty mucus >  zpak  Prednisone 10 mg take  4 each am x 2 days,   2 each am x 2 days,  1 each am x 2 days and stop  Work on inhaler technique:       12/25/2017 acute ov/[name] re: bronchiectasis flare, last levaquin was 12/20/17  / maint on  smyb 160 Chief Complaint  Patient presents with  . Acute Visit    started running fever 12/24/17. He also has had chest congestion and cough with dark brown sputum. He rarely uses his albuterol inhaler or neb.   Dyspnea:  MMRC3 = can't walk 100 yards even at a slow pace at a flat grade s stopping due to sob   Cough: worse one hour p hs with lots of yellow mucus esp in am / assoc overt HB Sleeping: 6 inch hob  SABA use: only once a day rec Sputum culture: nl flora/ neg afb smear  Mucinex is 1200 mg every 12  hours with flutter valve as much as possible  HRCT 11/3017 c/w bronchiectasis  VEST rec   02/05/2018  f/u ov/[name] re: copd 1, bronchiectasis maint on symb 160 2bid/flutter/vest  Chief Complaint  Patient presents with  . Follow-up   Dyspnea:  No change = mmrc3 Cough: better with approp use of flutter, no better with vest  Sleeping: 6 in blocks, some worse cough / congestion in am but not noct  SABA use: min rec Order was sent to DME company to discontinue your VEST        03/28/2018  f/u ov/[name] re: copd 1 with bronchiectasis/ maint symb 160 2bid Chief Complaint  Patient presents with  . Follow-up    Pt has productive cough-white, chest tightness, SOB with exertion.    Dyspnea:  MMRC2 = can't walk a nl pace on a flat grade s sob but does fine slow and flat ok at [employer] Cough variably purulent up to a half a cup in 24 h, fltter helps  Sleeping: 6 in bed blocks/ pillows x one  SABA use: varies  02: not using  Rec If mucus turns nasty/bloody > cipro 500 mg twice daily x 7 days (refillable) Please remember to go to the lab department   for your tests - we will call you with the results when they are available.    08/02/2018  f/u ov/[name] re: bronchiectasis / GOLD I copd  Chief Complaint  Patient presents with  . Follow-up    Cough with grey/green sputum, sometimes wakes in the night coughing. Last round of cipro was in Jan 2020. He is using his inhaler 2 x per wk on average.   Dyspnea:  Able to walk a quarter mile with dog some hills Cough: worse 2-3 am / uses flutter and back to bed also during the day  Sleeping: bed is flat/ right side down/ wedge SABA use: as above  02: none    No obvious day to day or daytime variability or assoc excess/ purulent sputum or mucus plugs or hemoptysis or cp or chest tightness, subjective wheeze or overt sinus or hb symptoms.     Also denies any obvious fluctuation of symptoms with weather or environmental changes or other aggravating or alleviating factors except as outlined above   No unusual exposure hx or h/o childhood pna/ asthma or knowledge of premature birth.  Current Allergies, Complete Past Medical History, Past Surgical History, Family History, and Social History were reviewed in [employer] record.  ROS  The following are not active complaints unless bolded Hoarseness, sore throat, dysphagia, dental problems, itching, sneezing,  nasal congestion or discharge of excess mucus  or purulent secretions, ear ache,   fever, chills, sweats, unintended wt loss or wt gain, classically pleuritic or exertional cp,  orthopnea pnd or arm/hand swelling  or leg swelling, presyncope,  palpitations, abdominal pain, anorexia, nausea, vomiting, diarrhea  or change in bowel habits or change in bladder habits, change in stools or change in urine, dysuria, hematuria,  rash, arthralgias, visual complaints, headache, numbness, weakness or ataxia or problems with walking or coordination,  change in mood or  memory.        Current Meds  Medication Sig  . albuterol (PROVENTIL HFA;VENTOLIN HFA) 108 (90 Base) MCG/ACT inhaler Inhale 2 puffs into the lungs every 6 (six) hours as needed for wheezing or shortness of breath.  . bisacodyl (DULCOLAX) 10 MG suppository Place 10 mg rectally as needed for moderate constipation.  . bromocriptine (PARLODEL) 2.5 MG tablet TAKE  (1)  TABLET TWICE A DAY.  . budesonide-formoterol (SYMBICORT) 160-4.5 MCG/ACT inhaler Inhale 2 puffs into the lungs 2 (two) times daily.  . busPIRone (BUSPAR) 15 MG tablet Take 1 tablet (15 mg total) by mouth 2 (two) times daily as needed.  . calcium carbonate (OSCAL) 1500 (600 Ca) MG TABS tablet Take 600 mg of elemental calcium by mouth daily with breakfast.   . cholecalciferol (VITAMIN D) 1000 UNITS tablet Take 1,000 Units by mouth daily.   Marland Kitchen diltiazem (CARDIZEM CD) 120 MG 24 hr capsule TAKE (1) CAPSULE DAILY  . ferrous sulfate 325 (65 FE) MG tablet Take 325 mg by mouth daily with breakfast.   . guaiFENesin (MUCINEX) 600 MG 12 hr tablet Take 600 mg by mouth daily.   Marland Kitchen levalbuterol (XOPENEX) 0.63 MG/3ML nebulizer solution Take 3 mLs (0.63 mg total) by nebulization every 6 (six) hours as needed for wheezing or shortness of breath.  . levothyroxine (SYNTHROID) 50 MCG tablet TAKE 1 TABLET IN MORNING  . magnesium oxide (MAG-OX) 400 (241.3 Mg) MG tablet Take 400 mg by mouth every other day.  . naphazoline-pheniramine (NAPHCON-A) 0.025-0.3 %  ophthalmic solution Place 1 drop into both eyes 2 (two) times daily as needed for irritation or allergies.   . nitroGLYCERIN (NITROSTAT) 0.4 MG SL tablet Place 1 tablet (0.4 mg total) under the tongue every 5 (five) minutes as needed for chest pain.  . pantoprazole (PROTONIX) 40 MG tablet TAKE (1) TABLET TWICE A DAY BEFORE MEALS.  Marland Kitchen polyethylene glycol powder (GLYCOLAX/MIRALAX) powder DISSOLVE 17 GM SCOOP IN 8 OZ. OF WATER AND DRINK ONCE DAILY AS NEEDEDFOR CONSTIPATION  . Respiratory Therapy Supplies (FLUTTER) DEVI Twice a day and prn as needed, may increase if feeling worse  . sucralfate (CARAFATE) 1 g tablet TAKE (1) TABLET FOUR TIMES DAILY AS NEEDED FOR GI UPSET  . vitamin B-12 (CYANOCOBALAMIN) 1000 MCG tablet Take 1,000 mcg by mouth daily.  . vitamin E (VITAMIN E) 400 UNIT capsule Take 400 Units by mouth daily.                    Past Medical History:  HYPERLIPIDEMIA (ICD-272.4)  CORONARY ARTERY DISEASE (ICD-414.00)  BENIGN PROSTATIC HYPERTROPHY, HX OF (ICD-V13.8)  DIVERTICULOSIS, MILD (ICD-562.10)  OSTEOPOROSIS (ICD-733.00)      - Took alendronate x 3 years, stopped due to GI effects DEGENERATIVE JOINT DISEASE (ICD-715.90)  ESOPHAGITIS (ICD-530.10)  ANEMIA (ICD-285.9)  COLONIC POLYPS (ICD-211.3)  HYPERPROLACTINEMIA (ICD-253.1)  HYPOTHYROIDISM (ICD-244.9)  PITUITARY ADENOMA (ICD-227.3)  COPD (ICD-496)     - 08/24/2010 95% HFA    - Bronchiectasis confirmed 12/27/17 by HRCT     Past Surgical History:  Inguinal herniorrhaphy  PTCA/stent  LEFT & RIGHT FOOT RECONSTRUCTION  hiatal hernia repair 2009   Family History:  neg for pituitary dz  mother died age  31 from phlebitis  father died age 43 from heart failure  8 siblings  alive age 82,80,89,92  1 died age 40 heart failure  3 died age 88,76,90 from heart problems    Social History:  married  2 children  retiired  never smoked  no etoh         Objective:   Physical Exam    03/28/2018     wt 167 07/05/2010   > 168 08/24/2010 > 04/05/2011 174> 07/12/2011  176> 09/24/2012  169 > 02/25/2013  175 >  01/31/2017   150 > 04/28/2017   156 > 07/31/2017    155  > 10/31/2017  149 > 11/13/2017   154 > 11/27/2017  146 > 12/25/2017   149 >02/05/2018  150 > 08/02/2018   154     06/13/16 153 lb 12.8 oz (69.8 kg)  05/10/16 155 lb (70.3 kg)  05/06/16 154 lb (69.9 kg)    Elderly wm nad    Vital signs reviewed - Note on arrival 02 sats  97% on RA      HEENT: nl dentition / oropharynx. Nl external ear canals without cough reflex -  Mild bilateral non-specific turbinate edema     NECK :  without JVD/Nodes/TM/ nl carotid upstrokes bilaterally   LUNGS: no acc muscle use,  Mild barrel  contour chest wall with bilateral  Min insp/exp rhonchi   without cough on insp or exp maneuver and mild  Hyperresonant  to  percussion bilaterally     CV:  RRR  no s3 or murmur or increase in P2, and no edema   ABD:  soft and nontender with pos end  insp Hoover's  in the supine position. No bruits or organomegaly appreciated, bowel sounds nl  MS:   Nl gait/  ext warm without deformities, calf tenderness, cyanosis or clubbing No obvious joint restrictions   SKIN: warm and dry without lesions    NEURO:  alert, approp, nl sensorium with  no motor or cerebellar deficits apparent.         Assessment & Plan:

## 2018-08-02 NOTE — Patient Instructions (Addendum)
Change cipro to 500 mg twice daily x 10 day cycles as needed for nasty mucus   Please schedule a follow up visit in 6 months but call sooner if needed

## 2018-08-04 ENCOUNTER — Encounter: Payer: Self-pay | Admitting: Internal Medicine

## 2018-08-04 NOTE — Assessment & Plan Note (Addendum)
HRCT chest  12/27/17  Bronchiectasis, tree-in-bud opacities, extensive airway debris/mucoid impaction and thick parenchymal banding in the mid to lower lungs, most prominent at the right lung base, similar to the 04/26/2017 chest CT study, with mild worsening of these findings in the interval. - VEST ordered 01/03/2018 > cancel 02/05/2018 at pt request  - cipro 500 bid x 7 days refillable 03/28/2018 >>> extended to 10 days 08/02/2018  - alpha one AT screen 03/28/2018  MM level 174  - Quant Ig's 03/28/2018 :   wnl 05/01/2018 - no recent exacerbations, took cipro once and has refill available. Needs new flutter valve d/t it being broken, he is using and getting up mucus.    Extensive discussion with pt again re pathyophysiology and rx options for bronchiectasis re when to pull the trigger and take course of cirpo - it does help but comes back win a few weeks so rec  1) max rx with flutter/ mucinex 2) extend cipro to 500 mg bid x 10day course  3)final call as to when to start cipro is up to the pt as it is entirely a judgement call  4) f/u q 3 m    I had an extended discussion with the patient reviewing all relevant studies completed to date and  lasting 15 to 20 minutes of a 25 minute visit    See device teaching which extended face to face time for this visit.  Each maintenance medication was reviewed in detail including emphasizing most importantly the difference between maintenance and prns and under what circumstances the prns are to be triggered using an action plan format that is not reflected in the computer generated alphabetically organized AVS which I have not found useful in most complex patients, especially with respiratory illnesses  Please see AVS for specific instructions unique to this visit that I personally wrote and verbalized to the the pt in detail and then reviewed with pt  by my nurse highlighting any  changes in therapy recommended at today's visit to their plan of care.

## 2018-08-04 NOTE — Assessment & Plan Note (Signed)
Quit smoking completely 1980 mostly pipes     - Spirometry 03/30/2010 FEV1  2.86 (116%) ratio 66  - FOB 02/01/2017  Copious retained secretions - Flutter valve re-training 04/28/2017  - PFT's  07/31/2017  FEV1 2.44 (102 % ) ratio 64  p 12 % improvement from saba p symbicort 160  prior to study with DLCO unable to perform  - 07/31/2017  After extensive coaching inhaler device  effectiveness =    75% from a baseline of 50% - 07/31/2017 d/c singulair and add levquin 500 mg daily x 7 day courses prn purulent sputum 11/06/2017- acute exacerbation with purluent sputum and low grade temp.Tx with levaquin - 11/13/2017    added back symb 80 2bid > increased to 160 2bid - 08/02/2018  After extensive coaching inhaler device,  effectiveness =    90%    Copd/ ab well controlled on present rx

## 2018-08-28 ENCOUNTER — Other Ambulatory Visit: Payer: Self-pay | Admitting: Endocrinology

## 2018-09-11 ENCOUNTER — Ambulatory Visit: Payer: Medicare Other | Admitting: Internal Medicine

## 2018-09-18 ENCOUNTER — Telehealth: Payer: Self-pay | Admitting: Family Medicine

## 2018-09-18 DIAGNOSIS — K219 Gastro-esophageal reflux disease without esophagitis: Secondary | ICD-10-CM

## 2018-09-18 DIAGNOSIS — E785 Hyperlipidemia, unspecified: Secondary | ICD-10-CM

## 2018-09-18 DIAGNOSIS — Z125 Encounter for screening for malignant neoplasm of prostate: Secondary | ICD-10-CM

## 2018-09-18 DIAGNOSIS — E221 Hyperprolactinemia: Secondary | ICD-10-CM

## 2018-09-18 DIAGNOSIS — E034 Atrophy of thyroid (acquired): Secondary | ICD-10-CM

## 2018-09-18 NOTE — Telephone Encounter (Signed)
Placed lab orders for patient to come and do before appointment.

## 2018-09-18 NOTE — Telephone Encounter (Signed)
Pt aware.

## 2018-09-19 ENCOUNTER — Other Ambulatory Visit: Payer: Medicare Other

## 2018-09-19 ENCOUNTER — Other Ambulatory Visit: Payer: Self-pay

## 2018-09-19 ENCOUNTER — Ambulatory Visit: Payer: Medicare Other | Admitting: Family Medicine

## 2018-09-19 DIAGNOSIS — E785 Hyperlipidemia, unspecified: Secondary | ICD-10-CM | POA: Diagnosis not present

## 2018-09-19 DIAGNOSIS — Z125 Encounter for screening for malignant neoplasm of prostate: Secondary | ICD-10-CM

## 2018-09-19 DIAGNOSIS — E034 Atrophy of thyroid (acquired): Secondary | ICD-10-CM

## 2018-09-19 DIAGNOSIS — K219 Gastro-esophageal reflux disease without esophagitis: Secondary | ICD-10-CM | POA: Diagnosis not present

## 2018-09-19 DIAGNOSIS — E221 Hyperprolactinemia: Secondary | ICD-10-CM | POA: Diagnosis not present

## 2018-09-20 LAB — CBC WITH DIFFERENTIAL/PLATELET
Basophils Absolute: 0.1 10*3/uL (ref 0.0–0.2)
Basos: 1 %
EOS (ABSOLUTE): 0.3 10*3/uL (ref 0.0–0.4)
Eos: 3 %
Hematocrit: 36.4 % — ABNORMAL LOW (ref 37.5–51.0)
Hemoglobin: 12.1 g/dL — ABNORMAL LOW (ref 13.0–17.7)
Immature Grans (Abs): 0.3 10*3/uL — ABNORMAL HIGH (ref 0.0–0.1)
Immature Granulocytes: 3 %
Lymphocytes Absolute: 1.3 10*3/uL (ref 0.7–3.1)
Lymphs: 13 %
MCH: 31.6 pg (ref 26.6–33.0)
MCHC: 33.2 g/dL (ref 31.5–35.7)
MCV: 95 fL (ref 79–97)
Monocytes Absolute: 1.3 10*3/uL — ABNORMAL HIGH (ref 0.1–0.9)
Monocytes: 13 %
Neutrophils Absolute: 6.6 10*3/uL (ref 1.4–7.0)
Neutrophils: 67 %
Platelets: 230 10*3/uL (ref 150–450)
RBC: 3.83 x10E6/uL — ABNORMAL LOW (ref 4.14–5.80)
RDW: 13.5 % (ref 11.6–15.4)
WBC: 9.9 10*3/uL (ref 3.4–10.8)

## 2018-09-20 LAB — CMP14+EGFR
ALT: 16 IU/L (ref 0–44)
AST: 20 IU/L (ref 0–40)
Albumin/Globulin Ratio: 1.6 (ref 1.2–2.2)
Albumin: 3.8 g/dL (ref 3.6–4.6)
Alkaline Phosphatase: 80 IU/L (ref 39–117)
BUN/Creatinine Ratio: 12 (ref 10–24)
BUN: 11 mg/dL (ref 8–27)
Bilirubin Total: 0.5 mg/dL (ref 0.0–1.2)
CO2: 23 mmol/L (ref 20–29)
Calcium: 9 mg/dL (ref 8.6–10.2)
Chloride: 102 mmol/L (ref 96–106)
Creatinine, Ser: 0.9 mg/dL (ref 0.76–1.27)
GFR calc Af Amer: 90 mL/min/{1.73_m2} (ref >59)
GFR calc non Af Amer: 78 mL/min/{1.73_m2} (ref >59)
Globulin, Total: 2.4 g/dL (ref 1.5–4.5)
Glucose: 81 mg/dL (ref 65–99)
Potassium: 4.2 mmol/L (ref 3.5–5.2)
Sodium: 140 mmol/L (ref 134–144)
Total Protein: 6.2 g/dL (ref 6.0–8.5)

## 2018-09-20 LAB — LIPID PANEL
Chol/HDL Ratio: 2.9 ratio (ref 0.0–5.0)
Cholesterol, Total: 167 mg/dL (ref 100–199)
HDL: 57 mg/dL
LDL Calculated: 99 mg/dL (ref 0–99)
Triglycerides: 56 mg/dL (ref 0–149)
VLDL Cholesterol Cal: 11 mg/dL (ref 5–40)

## 2018-09-20 LAB — TSH: TSH: 5.68 u[IU]/mL — ABNORMAL HIGH (ref 0.450–4.500)

## 2018-09-20 LAB — PSA, TOTAL AND FREE
PSA, Free Pct: 9 %
PSA, Free: 1.23 ng/mL
Prostate Specific Ag, Serum: 13.6 ng/mL — ABNORMAL HIGH (ref 0.0–4.0)

## 2018-09-21 MED ORDER — LEVOTHYROXINE SODIUM 75 MCG PO TABS: 75.0000 ug | ORAL_TABLET | Freq: Every day | ORAL | 1 refills | Status: DC

## 2018-09-25 ENCOUNTER — Other Ambulatory Visit (HOSPITAL_COMMUNITY): Payer: Self-pay | Admitting: Urology

## 2018-09-25 ENCOUNTER — Other Ambulatory Visit: Payer: Self-pay | Admitting: Urology

## 2018-09-25 ENCOUNTER — Encounter: Payer: Self-pay | Admitting: Family Medicine

## 2018-09-25 DIAGNOSIS — R9721 Rising PSA following treatment for malignant neoplasm of prostate: Secondary | ICD-10-CM

## 2018-09-26 NOTE — Telephone Encounter (Signed)
Patient aware and verbalizes understanding. 

## 2018-10-02 ENCOUNTER — Encounter: Payer: Self-pay | Admitting: Family Medicine

## 2018-10-02 NOTE — Telephone Encounter (Signed)
Will send to MW as Juluis Rainier on pt's status.   Dr. Melvyn Novas please see pt's email. FYI.

## 2018-10-03 NOTE — Telephone Encounter (Signed)
MW please advise.  Thanks.  

## 2018-10-04 DIAGNOSIS — Z1159 Encounter for screening for other viral diseases: Secondary | ICD-10-CM | POA: Diagnosis not present

## 2018-10-09 ENCOUNTER — Other Ambulatory Visit: Payer: Self-pay

## 2018-10-09 ENCOUNTER — Encounter (HOSPITAL_COMMUNITY)
Admission: RE | Admit: 2018-10-09 | Discharge: 2018-10-09 | Disposition: A | Discharge status: Home / Self Care | Payer: Medicare Other | Source: Ambulatory Visit | Attending: Urology | Admitting: Urology

## 2018-10-09 DIAGNOSIS — R9721 Rising PSA following treatment for malignant neoplasm of prostate: Secondary | ICD-10-CM

## 2018-10-09 MED ORDER — TECHNETIUM TC 99M MEDRONATE IV KIT
20.9000 | PACK | Freq: Once | INTRAVENOUS | Status: AC | PRN
  Administered 2018-10-09: 12:00:00 20.9 via INTRAVENOUS

## 2018-10-13 ENCOUNTER — Other Ambulatory Visit: Payer: Self-pay | Admitting: Family Medicine

## 2018-10-15 ENCOUNTER — Other Ambulatory Visit: Payer: Self-pay | Admitting: Internal Medicine

## 2018-10-17 ENCOUNTER — Encounter: Payer: Self-pay | Admitting: Family Medicine

## 2018-10-17 ENCOUNTER — Ambulatory Visit (INDEPENDENT_AMBULATORY_CARE_PROVIDER_SITE_OTHER): Payer: Medicare Other | Admitting: Family Medicine

## 2018-10-17 DIAGNOSIS — K2100 Gastro-esophageal reflux disease with esophagitis, without bleeding: Secondary | ICD-10-CM

## 2018-10-17 DIAGNOSIS — K5909 Other constipation: Secondary | ICD-10-CM | POA: Insufficient documentation

## 2018-10-17 DIAGNOSIS — I480 Paroxysmal atrial fibrillation: Secondary | ICD-10-CM | POA: Diagnosis not present

## 2018-10-17 DIAGNOSIS — E034 Atrophy of thyroid (acquired): Secondary | ICD-10-CM

## 2018-10-17 DIAGNOSIS — E785 Hyperlipidemia, unspecified: Secondary | ICD-10-CM | POA: Diagnosis not present

## 2018-10-17 NOTE — Progress Notes (Signed)
Virtual Visit via telephone Note  I connected with [name] on 10/17/18 at 1310 by telephone and verified that I am speaking with the correct person using two identifiers. [name] is currently located at home and no other people are currently with her during visit. The provider, [name] Dettinger, MD is located in their office at time of visit.  Call ended at 1338  I discussed the limitations, risks, security and privacy concerns of performing an evaluation and management service by telephone and the availability of in person appointments. I also discussed with the patient that there may be a patient responsible charge related to this service. The patient expressed understanding and agreed to proceed.  Temp 98,   History and Present Illness: Elevated psa, saw dr Alyson Ingles and they scanned him and he has an appt to follow up with the scans.  Hypothyroidism recheck Patient is coming in for thyroid recheck today as well. They deny any issues with hair changes or heat or cold problems or diarrhea or constipation. They deny any chest pain or palpitations. They are currently on levothyroxine 75 micrograms which was just increased, recommend retest   Hypertension and afib Patient is currently on diltiazem, and their blood pressure today is 112./62 Patient denies any lightheadedness or dizziness. Patient denies headaches, blurred vision, chest pains, shortness of breath, or weakness. Denies any side effects from medication and is content with current medication.   Hyperlipidemia Patient is coming in for recheck of his hyperlipidemia. The patient is currently taking no medication currently. They deny any issues with myalgias or history of liver damage from it. They deny any focal numbness or weakness or chest pain.   GERD Patient is currently on pantoprazole.  She denies any major symptoms or abdominal pain or belching or burping. She denies any blood in her stool or lightheadedness  or dizziness.   Chronic constipation Patient taking miralax for chronic constipation and it helps most of the time and he is considering enemas  No diagnosis found.  Outpatient Encounter Medications as of 10/17/2018  Medication Sig  . albuterol (PROVENTIL HFA;VENTOLIN HFA) 108 (90 Base) MCG/ACT inhaler Inhale 2 puffs into the lungs every 6 (six) hours as needed for wheezing or shortness of breath.  . bisacodyl (DULCOLAX) 10 MG suppository Place 10 mg rectally as needed for moderate constipation.  . bromocriptine (PARLODEL) 2.5 MG tablet TAKE  (1)  TABLET TWICE A DAY.  . budesonide-formoterol (SYMBICORT) 160-4.5 MCG/ACT inhaler Inhale 2 puffs into the lungs 2 (two) times daily.  . busPIRone (BUSPAR) 15 MG tablet Take 1 tablet (15 mg total) by mouth 2 (two) times daily as needed.  . calcium carbonate (OSCAL) 1500 (600 Ca) MG TABS tablet Take 600 mg of elemental calcium by mouth daily with breakfast.   . cholecalciferol (VITAMIN D) 1000 UNITS tablet Take 1,000 Units by mouth daily.   . ciprofloxacin (CIPRO) 500 MG tablet Take 1 tablet (500 mg total) by mouth 2 (two) times daily.  Marland Kitchen diltiazem (CARDIZEM CD) 120 MG 24 hr capsule TAKE (1) CAPSULE DAILY  . ferrous sulfate 325 (65 FE) MG tablet Take 325 mg by mouth daily with breakfast.   . GAVILAX 17 GM/SCOOP powder DISSOLVE 17 GM SCOOP IN 8 OZ. OF WATER AND DRINK ONCE DAILY AS NEEDED FOR CONSTIPATION  . guaiFENesin (MUCINEX) 600 MG 12 hr tablet Take 600 mg by mouth daily.   Marland Kitchen levalbuterol (XOPENEX) 0.63 MG/3ML nebulizer solution Take 3 mLs (0.63 mg total) by  nebulization every 6 (six) hours as needed for wheezing or shortness of breath.  . levofloxacin (LEVAQUIN) 500 MG tablet Take 500 mg by mouth 2 (two) times a day. X 10 days as needed for bronchiectasis flares  . levothyroxine (SYNTHROID) 75 MCG tablet Take 1 tablet (75 mcg total) by mouth daily.  . magnesium oxide (MAG-OX) 400 (241.3 Mg) MG tablet Take 400 mg by mouth every other day.  .  naphazoline-pheniramine (NAPHCON-A) 0.025-0.3 % ophthalmic solution Place 1 drop into both eyes 2 (two) times daily as needed for irritation or allergies.   . nitroGLYCERIN (NITROSTAT) 0.4 MG SL tablet Place 1 tablet (0.4 mg total) under the tongue every 5 (five) minutes as needed for chest pain.  . pantoprazole (PROTONIX) 40 MG tablet TAKE (1) TABLET TWICE A DAY BEFORE MEALS.  Marland Kitchen Respiratory Therapy Supplies (FLUTTER) DEVI Twice a day and prn as needed, may increase if feeling worse  . sucralfate (CARAFATE) 1 g tablet TAKE (1) TABLET FOUR TIMES DAILY AS NEEDED FOR GI UPSET  . vitamin B-12 (CYANOCOBALAMIN) 1000 MCG tablet Take 1,000 mcg by mouth daily.  . vitamin E (VITAMIN E) 400 UNIT capsule Take 400 Units by mouth daily.   No facility-administered encounter medications on file as of 10/17/2018.     Review of Systems  Constitutional: Negative for chills and fever.  Respiratory: Negative for shortness of breath and wheezing.   Cardiovascular: Negative for chest pain and leg swelling.  Musculoskeletal: Negative for back pain and gait problem.  Skin: Negative for rash.  Neurological: Negative for dizziness, weakness and numbness.  All other systems reviewed and are negative.   Observations/Objective: Patient sounds comfortable and in no acute distress  Assessment and Plan: Problem List Items Addressed This Visit      Cardiovascular and Mediastinum   Afib ([place])     Digestive   Gastroesophageal reflux disease with esophagitis   Chronic constipation     Endocrine   Hypothyroidism - Primary   Relevant Orders   TSH     Other   Hyperlipidemia LDL goal <70       Follow Up Instructions:  Follow up in 3 months  Come in in 2-3 weeks to check tsh   Patient follows up with vascular surgeon for aneurysm   I discussed the assessment and treatment plan with the patient. The patient was provided an opportunity to ask questions and all were answered. The patient agreed with the plan  and demonstrated an understanding of the instructions.   The patient was advised to call back or seek an in-person evaluation if the symptoms worsen or if the condition fails to improve as anticipated.  The above assessment and management plan was discussed with the patient. The patient verbalized understanding of and has agreed to the management plan. Patient is aware to call the clinic if symptoms persist or worsen. Patient is aware when to return to the clinic for a follow-up visit. Patient educated on when it is appropriate to go to the emergency department.    I provided 28 minutes of non-face-to-face time during this encounter.    Worthy Rancher, MD

## 2018-10-26 DIAGNOSIS — L821 Other seborrheic keratosis: Secondary | ICD-10-CM | POA: Diagnosis not present

## 2018-10-26 DIAGNOSIS — B078 Other viral warts: Secondary | ICD-10-CM | POA: Diagnosis not present

## 2018-10-26 DIAGNOSIS — I8393 Asymptomatic varicose veins of bilateral lower extremities: Secondary | ICD-10-CM | POA: Diagnosis not present

## 2018-10-26 DIAGNOSIS — D692 Other nonthrombocytopenic purpura: Secondary | ICD-10-CM | POA: Diagnosis not present

## 2018-10-26 DIAGNOSIS — L570 Actinic keratosis: Secondary | ICD-10-CM | POA: Diagnosis not present

## 2018-10-31 ENCOUNTER — Other Ambulatory Visit: Payer: Self-pay

## 2018-10-31 ENCOUNTER — Ambulatory Visit (INDEPENDENT_AMBULATORY_CARE_PROVIDER_SITE_OTHER): Payer: Medicare Other | Admitting: Family Medicine

## 2018-10-31 ENCOUNTER — Encounter: Payer: Self-pay | Admitting: Family Medicine

## 2018-10-31 DIAGNOSIS — L282 Other prurigo: Secondary | ICD-10-CM

## 2018-10-31 MED ORDER — TRIAMCINOLONE ACETONIDE 0.1 % EX CREA: 1.0000 "application " | TOPICAL_CREAM | Freq: Two times a day (BID) | CUTANEOUS | 0 refills | Status: DC

## 2018-10-31 MED ORDER — PREDNISONE 10 MG (21) PO TBPK: ORAL_TABLET | ORAL | 0 refills | Status: DC

## 2018-10-31 NOTE — Progress Notes (Signed)
Virtual Visit via telephone Note Due to COVID-19 pandemic this visit was conducted virtually. This visit type was conducted due to national recommendations for restrictions regarding the COVID-19 Pandemic (e.g. social distancing, sheltering in place) in an effort to limit this patient's exposure and mitigate transmission in our community. All issues noted in this document were discussed and addressed.  A physical exam was not performed with this format.   I connected with [name] on 10/31/18 at 1005 by telephone and verified that I am speaking with the correct person using two identifiers. [name] is currently located at home and no one is currently with them during visit. The provider, Monia Pouch, FNP is located in their office at time of visit.  I discussed the limitations, risks, security and privacy concerns of performing an evaluation and management service by telephone and the availability of in person appointments. I also discussed with the patient that there may be a patient responsible charge related to this service. The patient expressed understanding and agreed to proceed.  Subjective:  Patient ID: [name], male    DOB: 02/09/1933, 85 y.o.   MRN: [id number]  Chief Complaint:  Rash   HPI: [name] is a 85 y.o. male presenting on 10/31/2018 for Rash   Pt reports a rash to bilateral lower extremities. States the rash started a few days ago. States he has slightly red raised lesions that are very pruritic at night. States the start on his feet and go to his knees. He denies changes in household or personal care products. He denies contact with poison ivy / oak. No known insect bites. No fever, chills, malaise, abdominal pain, nausea, vomiting, diarrhea, headache, facial swelling, shortness of breath, chest pain, or syncope.   Rash This is a new problem. The current episode started in the past 7 days. The problem has been gradually worsening since onset.  The affected locations include the left lower leg, left ankle, left foot, right ankle, right lower leg, right foot, right toes and left toes. The rash is characterized by redness and itchiness. It is unknown if there was an exposure to a precipitant. Pertinent negatives include no anorexia, congestion, cough, diarrhea, eye pain, facial edema, fatigue, fever, joint pain, nail changes, rhinorrhea, shortness of breath, sore throat or vomiting. Past treatments include anti-itch cream and antihistamine. The treatment provided no relief.     Relevant past medical, surgical, family, and social history reviewed and updated as indicated.  Allergies and medications reviewed and updated.   Past Medical History:  Diagnosis Date  . Abnormality of gait 06/07/2013  . Anemia   . Aneurysm ([place])    on assending aorta, currently watching it, Dr Cyndia Bent  . Anxiety   . Arthritis   . Asthma   . Atrial fibrillation ([place])   . Barrett's esophagus   . CAD (coronary artery disease) 03/2006   3.0 x 20 mm TAXUS Perseus DES to the LAD; 01/2007  L main 30%, oLAD 50%, pLAD stent ok, CFX 80%, OM 60%, pRCA 60%, mRCA 70%, oPDA 90%; med rx   . Cancer ([place])    skin cancer   . Cataract    bilateral removal of cateracts  . Complication of anesthesia     no issues,but pt prefers spinal due to Pulmonary problems  . COPD (chronic obstructive pulmonary disease) ([place])    oxygen  on standby in home.  . Depression   . Diverticulosis   . Emphysema   .  Enlarged prostate with urinary retention   . Esophageal stenosis   . GERD (gastroesophageal reflux disease)   . Hiatal hernia   . Hypothyroidism   . Neuropathy   . Osteoporosis   . Pituitary macroadenoma ([place])   . Restless legs syndrome (RLS)   . UGIB (upper gastrointestinal bleed) 03/2013   EGD w/ large ulcer at GE junction    Past Surgical History:  Procedure Laterality Date  . ABDOMINAL HERNIA REPAIR   2008  . CARDIAC CATHETERIZATION  01/2007   L main 30%, oLAD 50%,   pLAD stent ok, CFX 80%, OM 60%, pRCA 60%, mRCA 70%, oPDA 90%; med rx  . cataract extraction both eyes    . COLONOSCOPY    . CORONARY STENT PLACEMENT  03/2006   3.0 x 20 mm TAXUS Perseus DES to the LAD  . CYSTOSCOPY N/A 06/10/2015   Procedure: CYSTOSCOPY FULGRATION OF BLEEDING,  electovapor resection;  Surgeon: Irine Seal, MD;  Location: [facility];  Service: Urology;  Laterality: N/A;  . CYSTOSCOPY WITH INSERTION OF UROLIFT N/A 06/01/2015   Procedure: CYSTOSCOPY WITH INSERTION OF UROLIFT x4;  Surgeon: Irine Seal, MD;  [facility]: [facility];  Service: Urology;  Laterality: N/A;  . ESOPHAGOGASTRODUODENOSCOPY N/A 04/20/2013   Procedure: ESOPHAGOGASTRODUODENOSCOPY (EGD);  Surgeon: Inda Castle, MD;  Location: [facility];  Service: Endoscopy;  Laterality: N/A;  . ESOPHAGOGASTRODUODENOSCOPY N/A 03/25/2016   Procedure: ESOPHAGOGASTRODUODENOSCOPY (EGD);  Surgeon: Irene Shipper, MD;  Location: [facility];  Service: Endoscopy;  Laterality: N/A;  . ESOPHAGOGASTRODUODENOSCOPY (EGD) WITH PROPOFOL N/A 10/13/2015   Procedure: ESOPHAGOGASTRODUODENOSCOPY (EGD) WITH PROPOFOL;  Surgeon: Jerene Bears, MD;  [facility];  Service: Gastroenterology;  Laterality: N/A;  . FOOT SURGERY  1994 left, 2002 right foot   bilateral foot reconstruciton  . FOOT SURGERY     reconstruction of both feet- no retained hardware.  Marland Kitchen HIATAL HERNIA REPAIR  01-04-2008  . MELANOMA EXCISION  2019  . POLYPECTOMY    . PROSTATE SURGERY     x 2  . SAVORY DILATION N/A 03/25/2016   Procedure: SAVORY DILATION;  Surgeon: Irene Shipper, MD;  [facility];  Service: Endoscopy;  Laterality: N/A;  . TOTAL HIP ARTHROPLASTY  07/21/2011   Procedure: TOTAL HIP ARTHROPLASTY ANTERIOR APPROACH;  Surgeon: Mauri Pole, MD;  Location: [facility];  Service: Orthopedics;  Laterality: Left;  . TOTAL HIP ARTHROPLASTY Right 09/07/2012   Procedure: RIGHT TOTAL HIP ARTHROPLASTY ANTERIOR APPROACH;  Surgeon: Mcarthur Rossetti, MD;  Location: [facility];   Service: Orthopedics;  Laterality: Right;  . TRANSURETHRAL RESECTION OF BLADDER NECK N/A 11/04/2015   Procedure: RESECTION OF BLADDER NECK;  Surgeon: Cleon Gustin, MD;  Location: [facility];  Service: Urology;  Laterality: N/A;  . TRANSURETHRAL RESECTION OF PROSTATE N/A 11/04/2015   Procedure: TRANSURETHRAL RESECTION OF THE PROSTATE (TURP); REMOVAL OF UROLIFT IMPLANTS X THREE;  Surgeon: Cleon Gustin, MD;  Location: [facility];  Service: Urology;  Laterality: N/A;  . UPPER GASTROINTESTINAL ENDOSCOPY  12/2013   Dr Hilarie Fredrickson, gastritis  . VIDEO BRONCHOSCOPY Bilateral 02/01/2017   Procedure: VIDEO BRONCHOSCOPY WITHOUT FLUORO;  Surgeon: Tanda Rockers, MD;  [facility];  Service: Cardiopulmonary;  Laterality: Bilateral;    Social History   Socioeconomic History  . Marital status: Widowed    Spouse name: Not on file  . Number of children: 2  . Years of education: 73  . Highest education level: Associate degree: occupational, Hotel manager, or vocational program  Occupational History  .  Occupation: retired  Scientific laboratory technician  . Financial resource strain: Not hard at all  . Food insecurity    Worry: Never true    Inability: Never true  . Transportation needs    Medical: No    Non-medical: No  Tobacco Use  . Smoking status: Never Smoker  . Smokeless tobacco: Never Used  Substance and Sexual Activity  . Alcohol use: No  . Drug use: No  . Sexual activity: Not Currently    Birth control/protection: None  Lifestyle  . Physical activity    Days per week: 0 days    Minutes per session: 0 min  . Stress: To some extent  Relationships  . Social connections    Talks on phone: More than three times a week    Gets together: More than three times a week    Attends religious service: More than 4 times per year    Active member of club or organization: Yes    Attends meetings of clubs or organizations: More than 4 times per year    Relationship status: Widowed  . Intimate partner violence     Fear of current or ex partner: No    Emotionally abused: No    Physically abused: No    Forced sexual activity: No  Other Topics Concern  . Not on file  Social History Narrative   Patient drinks caffeine a few times a week.   Patient is right handed.    Outpatient Encounter Medications as of 10/31/2018  Medication Sig  . albuterol (PROVENTIL HFA;VENTOLIN HFA) 108 (90 Base) MCG/ACT inhaler Inhale 2 puffs into the lungs every 6 (six) hours as needed for wheezing or shortness of breath.  . bisacodyl (DULCOLAX) 10 MG suppository Place 10 mg rectally as needed for moderate constipation.  . bromocriptine (PARLODEL) 2.5 MG tablet TAKE  (1)  TABLET TWICE A DAY.  . budesonide-formoterol (SYMBICORT) 160-4.5 MCG/ACT inhaler Inhale 2 puffs into the lungs 2 (two) times daily.  . busPIRone (BUSPAR) 15 MG tablet Take 1 tablet (15 mg total) by mouth 2 (two) times daily as needed.  . calcium carbonate (OSCAL) 1500 (600 Ca) MG TABS tablet Take 600 mg of elemental calcium by mouth daily with breakfast.   . cholecalciferol (VITAMIN D) 1000 UNITS tablet Take 1,000 Units by mouth daily.   . ciprofloxacin (CIPRO) 500 MG tablet Take 1 tablet (500 mg total) by mouth 2 (two) times daily.  Marland Kitchen diltiazem (CARDIZEM CD) 120 MG 24 hr capsule TAKE (1) CAPSULE DAILY  . ferrous sulfate 325 (65 FE) MG tablet Take 325 mg by mouth daily with breakfast.   . GAVILAX 17 GM/SCOOP powder DISSOLVE 17 GM SCOOP IN 8 OZ. OF WATER AND DRINK ONCE DAILY AS NEEDED FOR CONSTIPATION  . guaiFENesin (MUCINEX) 600 MG 12 hr tablet Take 600 mg by mouth daily.   Marland Kitchen levalbuterol (XOPENEX) 0.63 MG/3ML nebulizer solution Take 3 mLs (0.63 mg total) by nebulization every 6 (six) hours as needed for wheezing or shortness of breath.  . levofloxacin (LEVAQUIN) 500 MG tablet Take 500 mg by mouth 2 (two) times a day. X 10 days as needed for bronchiectasis flares  . levothyroxine (SYNTHROID) 75 MCG tablet Take 1 tablet (75 mcg total) by mouth daily.  .  magnesium oxide (MAG-OX) 400 (241.3 Mg) MG tablet Take 400 mg by mouth every other day.  . montelukast (SINGULAIR) 10 MG tablet   . naphazoline-pheniramine (NAPHCON-A) 0.025-0.3 % ophthalmic solution Place 1 drop into both eyes 2 (two)  times daily as needed for irritation or allergies.   . nitroGLYCERIN (NITROSTAT) 0.4 MG SL tablet Place 1 tablet (0.4 mg total) under the tongue every 5 (five) minutes as needed for chest pain.  . pantoprazole (PROTONIX) 40 MG tablet TAKE (1) TABLET TWICE A DAY BEFORE MEALS.  Marland Kitchen predniSONE (STERAPRED UNI-PAK 21 TAB) 10 MG (21) TBPK tablet As directed x 6 days  . Respiratory Therapy Supplies (FLUTTER) DEVI Twice a day and prn as needed, may increase if feeling worse  . silodosin (RAPAFLO) 4 MG CAPS capsule   . sucralfate (CARAFATE) 1 g tablet TAKE (1) TABLET FOUR TIMES DAILY AS NEEDED FOR GI UPSET  . triamcinolone cream (KENALOG) 0.1 % Apply 1 application topically 2 (two) times daily.  . vitamin B-12 (CYANOCOBALAMIN) 1000 MCG tablet Take 1,000 mcg by mouth daily.  . vitamin E (VITAMIN E) 400 UNIT capsule Take 400 Units by mouth daily.   No facility-administered encounter medications on file as of 10/31/2018.     Allergies  Allergen Reactions  . Betadine [Povidone Iodine] Other (See Comments)    Reaction:  Blisters   . Gadavist [Gadobutrol]     hives  . Lortab [Hydrocodone-Acetaminophen] Nausea And Vomiting  . Penicillins Other (See Comments)    Reaction:  Lightheadedness  Has patient had a PCN reaction causing immediate rash, facial/tongue/throat swelling, SOB or lightheadedness with hypotension: Yes Has patient had a PCN reaction causing severe rash involving mucus membranes or skin necrosis: No Has patient had a PCN reaction that required hospitalization No Has patient had a PCN reaction occurring within the last 10 years: No If all of the above answers are "NO", then may proceed with Cephalosporin use.  Marland Kitchen Zetia [Ezetimibe] Other (See Comments)     Reaction:  Muscle weakness   . Zocor [Simvastatin] Nausea Only and Other (See Comments)    Reaction:  Muscle weakness     Review of Systems  Constitutional: Negative for activity change, appetite change, chills, diaphoresis, fatigue, fever and unexpected weight change.  HENT: Negative for congestion, drooling, rhinorrhea, sore throat, trouble swallowing and voice change.   Eyes: Negative for photophobia, pain and visual disturbance.  Respiratory: Negative for cough, choking, chest tightness, shortness of breath, wheezing and stridor.   Cardiovascular: Negative for chest pain, palpitations and leg swelling.  Gastrointestinal: Negative for abdominal pain, anorexia, constipation, diarrhea, nausea and vomiting.  Genitourinary: Negative for decreased urine volume and difficulty urinating.  Musculoskeletal: Negative for arthralgias, joint pain and myalgias.  Skin: Positive for rash. Negative for nail changes.  Neurological: Negative for dizziness, syncope, weakness, light-headedness and headaches.  Hematological: Negative for adenopathy.  Psychiatric/Behavioral: Negative for confusion.  All other systems reviewed and are negative.        Observations/Objective: No vital signs or physical exam, this was a telephone or virtual health encounter.  Pt alert and oriented, answers all questions appropriately, and able to speak in full sentences.    Assessment and Plan: [name] was seen today for rash.  Diagnoses and all orders for this visit:  Pruritic rash Pruritic rash of unknown etiology. Localized to bilateral lower extremities. Increased pruritis at night. Possibly chigger or flea bites. No concerning for cellulitis. Not concerning for anaphylaxis. Pt aware to inspect dog and house for fleas and treat if discovered. Symptomatic care discussed. Will treat with below. Report lack of improvement with below treatment modalities. Follow up as needed. Report any new or worsening symptoms.  -      predniSONE (STERAPRED UNI-PAK 21  TAB) 10 MG (21) TBPK tablet; As directed x 6 days -     triamcinolone cream (KENALOG) 0.1 %; Apply 1 application topically 2 (two) times daily.    Follow Up Instructions: Return if symptoms worsen or fail to improve.    I discussed the assessment and treatment plan with the patient. The patient was provided an opportunity to ask questions and all were answered. The patient agreed with the plan and demonstrated an understanding of the instructions.   The patient was advised to call back or seek an in-person evaluation if the symptoms worsen or if the condition fails to improve as anticipated.  The above assessment and management plan was discussed with the patient. The patient verbalized understanding of and has agreed to the management plan. Patient is aware to call the clinic if symptoms persist or worsen. Patient is aware when to return to the clinic for a follow-up visit. Patient educated on when it is appropriate to go to the emergency department.    I provided 15 minutes of non-face-to-face time during this encounter. The call started at 1005. The call ended at 1020. The other time was used for coordination of care.    Monia Pouch, FNP-C [facility] [address] [place], [place] [address] [phone] 10/31/18

## 2018-11-21 ENCOUNTER — Ambulatory Visit: Payer: Medicare Other | Admitting: Surgery

## 2018-11-22 ENCOUNTER — Other Ambulatory Visit: Payer: Self-pay | Admitting: Family Medicine

## 2018-11-28 ENCOUNTER — Telehealth: Payer: Self-pay | Admitting: Internal Medicine

## 2018-11-28 NOTE — Telephone Encounter (Signed)
Pt scheduled for an appt with Tye Savoy NP next week. Pt thinks he may need an EGD. Discussed with pt that when he sees Nevin Bloodgood if she feels he needs to have EGD done she can set that up for him.

## 2018-12-04 ENCOUNTER — Encounter: Payer: Self-pay | Admitting: Physician Assistant

## 2018-12-04 ENCOUNTER — Telehealth: Payer: Self-pay | Admitting: Family Medicine

## 2018-12-04 ENCOUNTER — Ambulatory Visit (INDEPENDENT_AMBULATORY_CARE_PROVIDER_SITE_OTHER): Payer: Medicare Other | Admitting: Physician Assistant

## 2018-12-04 DIAGNOSIS — R0902 Hypoxemia: Secondary | ICD-10-CM | POA: Diagnosis not present

## 2018-12-04 DIAGNOSIS — Z87898 Personal history of other specified conditions: Secondary | ICD-10-CM | POA: Insufficient documentation

## 2018-12-04 DIAGNOSIS — I952 Hypotension due to drugs: Secondary | ICD-10-CM | POA: Diagnosis not present

## 2018-12-04 DIAGNOSIS — R531 Weakness: Secondary | ICD-10-CM | POA: Diagnosis not present

## 2018-12-04 NOTE — Telephone Encounter (Signed)
Apt scheduled.  

## 2018-12-04 NOTE — Progress Notes (Signed)
Telephone visit  Subjective: [id number] PCP: Dettinger, [name], MD HPI:[name] is a 85 y.o. male calls for telephone consult today. Patient provides verbal consent for consult held via phone.  Patient is identified with 2 separate identifiers.  At this time the entire area is on COVID-19 social distancing and stay home orders are in place.  Patient is of higher risk and therefore we are performing this by a virtual method.  Location of patient: home Location of provider: [facility] Others present for call: no  He has had 2 days of feeling light headed and weak. He was working outside and felt lightheaded and took 2 hours to recover.  He had blood pressure reading between 58/47-99/60 and oxygen sats 88-90%.  Because he had had issues with hypoxia in the past.  He still had some oxygen cylinders at home.  So he went ahead and put that on and at about 325 yesterday afternoon his blood pressure had come back up to the 99/60 reading.  And his oxygen stayed between 95 and 98%.  His temperature was normal at 97.5.  Therefore he called this morning saying that he had these episodes.  We have done a phone visit to help work through this.  He is on diltiazem 120 mg 1 daily.  Have asked him to hold that for now.  And we will plan for an appointment with Dr. Warrick Parisian in the next few days.  We will also have earlier appointment with Dr. Percival Spanish.  He normally sees sees him once a year and is not due to go back until March.  I have given him instructions on who and when he should call.  He has people that are checking on him.  He will let us know if there is any other changes.   ROS: Per HPI  Allergies  Allergen Reactions  . Betadine [Povidone Iodine] Other (See Comments)    Reaction:  Blisters   . Gadavist [Gadobutrol]     hives  . Lortab [Hydrocodone-Acetaminophen] Nausea And Vomiting  . Penicillins Other (See Comments)    Reaction:  Lightheadedness  Has patient had a PCN reaction  causing immediate rash, facial/tongue/throat swelling, SOB or lightheadedness with hypotension: Yes Has patient had a PCN reaction causing severe rash involving mucus membranes or skin necrosis: No Has patient had a PCN reaction that required hospitalization No Has patient had a PCN reaction occurring within the last 10 years: No If all of the above answers are "NO", then may proceed with Cephalosporin use.  Marland Kitchen Zetia [Ezetimibe] Other (See Comments)    Reaction:  Muscle weakness   . Zocor [Simvastatin] Nausea Only and Other (See Comments)    Reaction:  Muscle weakness    Past Medical History:  Diagnosis Date  . Abnormality of gait 06/07/2013  . Anemia   . Aneurysm ([place])    on assending aorta, currently watching it, Dr Cyndia Bent  . Anxiety   . Arthritis   . Asthma   . Atrial fibrillation ([place])   . Barrett's esophagus   . CAD (coronary artery disease) 03/2006   3.0 x 20 mm TAXUS Perseus DES to the LAD; 01/2007  L main 30%, oLAD 50%, pLAD stent ok, CFX 80%, OM 60%, pRCA 60%, mRCA 70%, oPDA 90%; med rx   . Cancer ([place])    skin cancer   . Cataract    bilateral removal of cateracts  . Complication of anesthesia     no issues,but pt prefers  spinal due to Pulmonary problems  . COPD (chronic obstructive pulmonary disease) ([place])    oxygen  on standby in home.  . Depression   . Diverticulosis   . Emphysema   . Enlarged prostate with urinary retention   . Esophageal stenosis   . GERD (gastroesophageal reflux disease)   . Hiatal hernia   . Hypothyroidism   . Neuropathy   . Osteoporosis   . Pituitary macroadenoma ([place])   . Restless legs syndrome (RLS)   . UGIB (upper gastrointestinal bleed) 03/2013   EGD w/ large ulcer at GE junction    Current Outpatient Medications:  .  albuterol (PROVENTIL HFA;VENTOLIN HFA) 108 (90 Base) MCG/ACT inhaler, Inhale 2 puffs into the lungs every 6 (six) hours as needed for wheezing or shortness of breath., Disp: 18 g, Rfl: 2 .  bisacodyl (DULCOLAX) 10 MG  suppository, Place 10 mg rectally as needed for moderate constipation., Disp: , Rfl:  .  bromocriptine (PARLODEL) 2.5 MG tablet, TAKE  (1)  TABLET TWICE A DAY., Disp: 60 tablet, Rfl: 0 .  budesonide-formoterol (SYMBICORT) 160-4.5 MCG/ACT inhaler, Inhale 2 puffs into the lungs 2 (two) times daily., Disp: 1 Inhaler, Rfl: 1 .  busPIRone (BUSPAR) 15 MG tablet, Take 1 tablet (15 mg total) by mouth 2 (two) times daily as needed., Disp: 60 tablet, Rfl: 1 .  calcium carbonate (OSCAL) 1500 (600 Ca) MG TABS tablet, Take 600 mg of elemental calcium by mouth daily with breakfast. , Disp: , Rfl:  .  cholecalciferol (VITAMIN D) 1000 UNITS tablet, Take 1,000 Units by mouth daily. , Disp: , Rfl:  .  ciprofloxacin (CIPRO) 500 MG tablet, Take 1 tablet (500 mg total) by mouth 2 (two) times daily., Disp: 20 tablet, Rfl: 11 .  diltiazem (CARDIZEM CD) 120 MG 24 hr capsule, TAKE (1) CAPSULE DAILY, Disp: 90 capsule, Rfl: 0 .  ferrous sulfate 325 (65 FE) MG tablet, Take 325 mg by mouth daily with breakfast. , Disp: , Rfl:  .  GAVILAX 17 GM/SCOOP powder, DISSOLVE 17 GM SCOOP IN 8 OZ. OF WATER AND DRINK ONCE DAILY AS NEEDED FOR CONSTIPATION, Disp: 510 g, Rfl: 0 .  guaiFENesin (MUCINEX) 600 MG 12 hr tablet, Take 600 mg by mouth daily. , Disp: , Rfl:  .  levalbuterol (XOPENEX) 0.63 MG/3ML nebulizer solution, Take 3 mLs (0.63 mg total) by nebulization every 6 (six) hours as needed for wheezing or shortness of breath., Disp: 72 mL, Rfl: 1 .  levothyroxine (SYNTHROID) 75 MCG tablet, Take 1 tablet (75 mcg total) by mouth daily., Disp: 90 tablet, Rfl: 1 .  magnesium oxide (MAG-OX) 400 (241.3 Mg) MG tablet, Take 400 mg by mouth every other day., Disp: , Rfl:  .  montelukast (SINGULAIR) 10 MG tablet, , Disp: , Rfl:  .  naphazoline-pheniramine (NAPHCON-A) 0.025-0.3 % ophthalmic solution, Place 1 drop into both eyes 2 (two) times daily as needed for irritation or allergies. , Disp: , Rfl:  .  nitroGLYCERIN (NITROSTAT) 0.4 MG SL tablet,  Place 1 tablet (0.4 mg total) under the tongue every 5 (five) minutes as needed for chest pain., Disp: 25 tablet, Rfl: 2 .  pantoprazole (PROTONIX) 40 MG tablet, TAKE (1) TABLET TWICE A DAY BEFORE MEALS., Disp: 180 tablet, Rfl: 0 .  predniSONE (STERAPRED UNI-PAK 21 TAB) 10 MG (21) TBPK tablet, As directed x 6 days, Disp: 21 tablet, Rfl: 0 .  Respiratory Therapy Supplies (FLUTTER) DEVI, Twice a day and prn as needed, may increase if feeling worse, Disp:  1 each, Rfl: 0 .  silodosin (RAPAFLO) 4 MG CAPS capsule, , Disp: , Rfl:  .  sucralfate (CARAFATE) 1 g tablet, TAKE (1) TABLET FOUR TIMES DAILY AS NEEDED FOR GI UPSET, Disp: 120 tablet, Rfl: 0 .  triamcinolone cream (KENALOG) 0.1 %, Apply 1 application topically 2 (two) times daily., Disp: 30 g, Rfl: 0 .  vitamin B-12 (CYANOCOBALAMIN) 1000 MCG tablet, Take 1,000 mcg by mouth daily., Disp: , Rfl:  .  vitamin E (VITAMIN E) 400 UNIT capsule, Take 400 Units by mouth daily., Disp: , Rfl:   Assessment/ Plan: 85 y.o. male   1. Hypotension due to drugs HOLD DILTIAZEM 120 MG for now Get [facility] appointment [facility] with [facility] APPT Monday with Dettinger Warnings to call EMS if needed  2. Hypoxia See above  3. Weakness See above  4. History of palpitations See above   No follow-ups on file.  Continue all other maintenance medications as listed above.  Start time: 4:18 PM End time: 4:40 PM  No orders of the defined types were placed in this encounter.   Particia Nearing PA-C [facility] [phone]

## 2018-12-04 NOTE — Telephone Encounter (Signed)
Pt needs a visit. It could be telephone if he is having SOB or fevers.

## 2018-12-04 NOTE — Telephone Encounter (Signed)
Patient states that for about 2 hours yesterday his bp was low and he felt dizzy.  Not on BP medication.  2pm- bp: 74/53  O2:90 2:30pm- 58/47 O2:87 3pm- 67/48  P:60  O2: 88  Patient states he put his self on oxygen.   3:30pm- 99/66 O2: 98 4:30pm- 112/58    Patient states that his bp is normally low but not as low as it was yesterday.  States that he feels fine today and at 9:30am today his bp was 108/5/8 which is around his normal range.  Covering PCP- please advise

## 2018-12-05 ENCOUNTER — Other Ambulatory Visit: Payer: Self-pay | Admitting: Surgery

## 2018-12-05 ENCOUNTER — Ambulatory Visit (INDEPENDENT_AMBULATORY_CARE_PROVIDER_SITE_OTHER): Payer: Medicare Other | Admitting: Nurse Practitioner

## 2018-12-05 ENCOUNTER — Encounter: Payer: Self-pay | Admitting: Nurse Practitioner

## 2018-12-05 ENCOUNTER — Telehealth: Payer: Self-pay | Admitting: *Deleted

## 2018-12-05 VITALS — BP 112/74 | HR 84 | Temp 97.7°F | Ht 65.0 in | Wt 152.2 lb

## 2018-12-05 DIAGNOSIS — R0789 Other chest pain: Secondary | ICD-10-CM

## 2018-12-05 DIAGNOSIS — I712 Thoracic aortic aneurysm, without rupture, unspecified: Secondary | ICD-10-CM

## 2018-12-05 NOTE — Patient Instructions (Signed)
It has been recommended to you by your physician that you have a(n) EGD completed.  We did not schedule the procedure(s) today due to pending Cardiac Evaluation. Please contact our office at [phone] should you decide to have the procedure completed. You will be scheduled for a pre-visit and procedure at that time.  Thank you for choosing me and [facility].  [place]

## 2018-12-05 NOTE — Progress Notes (Signed)
Virtual Visit via Telephone Note   This visit type was conducted due to national recommendations for restrictions regarding the COVID-19 Pandemic (e.g. social distancing) in an effort to limit this patient's exposure and mitigate transmission in our community.  Due to his co-morbid illnesses, this patient is at least at moderate risk for complications without adequate follow up.  This format is felt to be most appropriate for this patient at this time.  The patient did not have access to video technology/had technical difficulties with video requiring transitioning to audio format only (telephone).  All issues noted in this document were discussed and addressed.  No physical exam could be performed with this format.  Please refer to the patient's chart for his  consent to telehealth for [facility].   Date:  12/06/2018   ID:  [name], DOB [birth year], MRN [id number]  Patient Location: Home Provider Location: Home  PCP:  Dettinger, [name], MD  Cardiologist:  Minus Breeding, MD  Electrophysiologist:  None   Evaluation Performed:  Follow-Up Visit  Chief Complaint:  CAD  History of Present Illness:    [name] is a 85 y.o. male who presents for followup of his known coronary disease. He had a negative stress perfusion study in 2017.   He wore a Holter..  there were PVCs, non sustained V tach and atrial tach and PVCs/PACs.   Echo at that time was essentially unremarkable.   He called the other day because he was having an episode of lightheadedness.  This happened on Monday 2 days ago.  He was outside trimming some hedges.  He became acutely lightheaded.  He took his blood pressure and it was in the 70s over 50s.  He said it actually dipped down into the 60s over 40s.  His heart rate was okay in the 60s.  He said his oxygen saturation fell down to the 90s.  It took him a couple of hours to recover.  He talked to a friend is a Marine scientist and he stopped his Cardizem.  He actually  went to the GI office yesterday and I reviewed these records.  There was no EKG or labs done.  It was discussed with him that he should call us.  He felt fine since the event resolved.  He is not having any chest, neck or arm discomfort.  He has had none of the symptoms that he had prior to his right the time of his stents.  He has had no shortness of breath, PND or orthopnea.  He has not felt any palpitations, presyncope or syncope.  He was not having those at this moment  The patient does not have symptoms concerning for COVID-19 infection (fever, chills, cough, or new shortness of breath).    Past Medical History:  Diagnosis Date  . Abnormality of gait 06/07/2013  . Anemia   . Aneurysm ([place])    on assending aorta, currently watching it, Dr Cyndia Bent  . Anxiety   . Arthritis   . Asthma   . Atrial fibrillation ([place])   . Barrett's esophagus   . CAD (coronary artery disease) 03/2006   3.0 x 20 mm TAXUS Perseus DES to the LAD; 01/2007  L main 30%, oLAD 50%, pLAD stent ok, CFX 80%, OM 60%, pRCA 60%, mRCA 70%, oPDA 90%; med rx   . Cancer ([place])    skin cancer   . Cataract    bilateral removal of cateracts  . Complication of anesthesia  no issues,but pt prefers spinal due to Pulmonary problems  . COPD (chronic obstructive pulmonary disease) ([place])    oxygen  on standby in home.  . Depression   . Diverticulosis   . Emphysema   . Enlarged prostate with urinary retention   . Esophageal stenosis   . GERD (gastroesophageal reflux disease)   . Hiatal hernia   . Hypothyroidism   . Neuropathy   . Osteoporosis   . Pituitary macroadenoma ([place])   . Restless legs syndrome (RLS)   . UGIB (upper gastrointestinal bleed) 03/2013   EGD w/ large ulcer at GE junction   Past Surgical History:  Procedure Laterality Date  . ABDOMINAL HERNIA REPAIR   2008  . CARDIAC CATHETERIZATION  01/2007   L main 30%, oLAD 50%,  pLAD stent ok, CFX 80%, OM 60%, pRCA 60%, mRCA 70%, oPDA 90%; med rx  . cataract  extraction both eyes    . COLONOSCOPY    . CORONARY STENT PLACEMENT  03/2006   3.0 x 20 mm TAXUS Perseus DES to the LAD  . CYSTOSCOPY N/A 06/10/2015   Procedure: CYSTOSCOPY FULGRATION OF BLEEDING,  electovapor resection;  Surgeon: Irine Seal, MD;  Location: [facility];  Service: Urology;  Laterality: N/A;  . CYSTOSCOPY WITH INSERTION OF UROLIFT N/A 06/01/2015   Procedure: CYSTOSCOPY WITH INSERTION OF UROLIFT x4;  Surgeon: Irine Seal, MD;  Location: [facility];  Service: Urology;  Laterality: N/A;  . ESOPHAGOGASTRODUODENOSCOPY N/A 04/20/2013   Procedure: ESOPHAGOGASTRODUODENOSCOPY (EGD);  Surgeon: Inda Castle, MD;  Location: [facility];  Service: Endoscopy;  Laterality: N/A;  . ESOPHAGOGASTRODUODENOSCOPY N/A 03/25/2016   Procedure: ESOPHAGOGASTRODUODENOSCOPY (EGD);  Surgeon: Irene Shipper, MD;  Location: [facility];  Service: Endoscopy;  Laterality: N/A;  . ESOPHAGOGASTRODUODENOSCOPY (EGD) WITH PROPOFOL N/A 10/13/2015   Procedure: ESOPHAGOGASTRODUODENOSCOPY (EGD) WITH PROPOFOL;  Surgeon: Jerene Bears, MD;  [facility];  Service: Gastroenterology;  Laterality: N/A;  . FOOT SURGERY  1994 left, 2002 right foot   bilateral foot reconstruciton  . FOOT SURGERY     reconstruction of both feet- no retained hardware.  Marland Kitchen HIATAL HERNIA REPAIR  01-04-2008  . MELANOMA EXCISION  2019  . POLYPECTOMY    . PROSTATE SURGERY     x 2  . SAVORY DILATION N/A 03/25/2016   Procedure: SAVORY DILATION;  Surgeon: Irene Shipper, MD;  [facility];  Service: Endoscopy;  Laterality: N/A;  . TOTAL HIP ARTHROPLASTY  07/21/2011   Procedure: TOTAL HIP ARTHROPLASTY ANTERIOR APPROACH;  Surgeon: Mauri Pole, MD;  Location: [facility];  Service: Orthopedics;  Laterality: Left;  . TOTAL HIP ARTHROPLASTY Right 09/07/2012   Procedure: RIGHT TOTAL HIP ARTHROPLASTY ANTERIOR APPROACH;  Surgeon: Mcarthur Rossetti, MD;  Location: [facility];  Service: Orthopedics;  Laterality: Right;  . TRANSURETHRAL RESECTION OF BLADDER  NECK N/A 11/04/2015   Procedure: RESECTION OF BLADDER NECK;  Surgeon: Cleon Gustin, MD;  Location: [facility];  Service: Urology;  Laterality: N/A;  . TRANSURETHRAL RESECTION OF PROSTATE N/A 11/04/2015   Procedure: TRANSURETHRAL RESECTION OF THE PROSTATE (TURP); REMOVAL OF UROLIFT IMPLANTS X THREE;  Surgeon: Cleon Gustin, MD;  Location: [facility];  Service: Urology;  Laterality: N/A;  . UPPER GASTROINTESTINAL ENDOSCOPY  12/2013   Dr Hilarie Fredrickson, gastritis  . VIDEO BRONCHOSCOPY Bilateral 02/01/2017   Procedure: VIDEO BRONCHOSCOPY WITHOUT FLUORO;  Surgeon: Tanda Rockers, MD;  [facility];  Service: Cardiopulmonary;  Laterality: Bilateral;     Current Meds  Medication Sig  .  albuterol (PROVENTIL HFA;VENTOLIN HFA) 108 (90 Base) MCG/ACT inhaler Inhale 2 puffs into the lungs every 6 (six) hours as needed for wheezing or shortness of breath.  . bisacodyl (DULCOLAX) 10 MG suppository Place 10 mg rectally as needed for moderate constipation.  . budesonide-formoterol (SYMBICORT) 160-4.5 MCG/ACT inhaler Inhale 2 puffs into the lungs 2 (two) times daily.  . busPIRone (BUSPAR) 15 MG tablet Take 1 tablet (15 mg total) by mouth 2 (two) times daily as needed.  . calcium carbonate (OSCAL) 1500 (600 Ca) MG TABS tablet Take 600 mg of elemental calcium by mouth daily with breakfast.   . cholecalciferol (VITAMIN D) 1000 UNITS tablet Take 1,000 Units by mouth daily.   . ciprofloxacin (CIPRO) 500 MG tablet Take 1 tablet (500 mg total) by mouth 2 (two) times daily.  . ferrous sulfate 325 (65 FE) MG tablet Take 325 mg by mouth daily with breakfast.   . GAVILAX 17 GM/SCOOP powder DISSOLVE 17 GM SCOOP IN 8 OZ. OF WATER AND DRINK ONCE DAILY AS NEEDED FOR CONSTIPATION  . guaiFENesin (MUCINEX) 600 MG 12 hr tablet Take 600 mg by mouth daily.   Marland Kitchen levalbuterol (XOPENEX) 0.63 MG/3ML nebulizer solution Take 3 mLs (0.63 mg total) by nebulization every 6 (six) hours as needed for wheezing or shortness of breath.  .  levothyroxine (SYNTHROID) 75 MCG tablet Take 1 tablet (75 mcg total) by mouth daily.  . magnesium oxide (MAG-OX) 400 (241.3 Mg) MG tablet Take 400 mg by mouth every other day.  . montelukast (SINGULAIR) 10 MG tablet   . naphazoline-pheniramine (NAPHCON-A) 0.025-0.3 % ophthalmic solution Place 1 drop into both eyes 2 (two) times daily as needed for irritation or allergies.   . nitroGLYCERIN (NITROSTAT) 0.4 MG SL tablet Place 1 tablet (0.4 mg total) under the tongue every 5 (five) minutes as needed for chest pain.  . pantoprazole (PROTONIX) 40 MG tablet TAKE (1) TABLET TWICE A DAY BEFORE MEALS.  Marland Kitchen sucralfate (CARAFATE) 1 g tablet TAKE (1) TABLET FOUR TIMES DAILY AS NEEDED FOR GI UPSET  . triamcinolone cream (KENALOG) 0.1 % Apply 1 application topically 2 (two) times daily.  . vitamin B-12 (CYANOCOBALAMIN) 1000 MCG tablet Take 1,000 mcg by mouth daily.  . vitamin E (VITAMIN E) 400 UNIT capsule Take 400 Units by mouth daily.     Allergies:   Betadine [povidone iodine], Gadavist [gadobutrol], Lortab [hydrocodone-acetaminophen], Penicillins, Zetia [ezetimibe], and Zocor [simvastatin]   Social History   Tobacco Use  . Smoking status: Never Smoker  . Smokeless tobacco: Never Used  Substance Use Topics  . Alcohol use: No  . Drug use: No     Family Hx: The patient's family history includes Heart disease in his sister; Heart failure in his father; Other in his mother. There is no history of Colon cancer, Esophageal cancer, Rectal cancer, Stomach cancer, or Colon polyps.  ROS:   Please see the history of present illness.    No All other systems reviewed and are negative.   Prior CV studies:   The following studies were reviewed today:  EKG, echo 2017, Lexiscan Myoview 2017  Labs/Other Tests and Data Reviewed:    EKG:  No ECG reviewed.  Recent Labs: 09/19/2018: ALT 16; BUN 11; Creatinine, Ser 0.90; Hemoglobin 12.1; Platelets 230; Potassium 4.2; Sodium 140; TSH 5.680   Recent Lipid  Panel Lab Results  Component Value Date/Time   CHOL 167 09/19/2018 08:04 AM   TRIG 56 09/19/2018 08:04 AM   TRIG 71 04/01/2015 09:34 AM  HDL 57 09/19/2018 08:04 AM   HDL 56 04/01/2015 09:34 AM   CHOLHDL 2.9 09/19/2018 08:04 AM   CHOLHDL 2.9 06/12/2015 05:29 AM   LDLCALC 99 09/19/2018 08:04 AM   LDLCALC 121 (H) 08/29/2013 09:09 AM    Wt Readings from Last 3 Encounters:  12/06/18 146 lb (66.2 kg)  12/05/18 152 lb 4 oz (69.1 kg)  08/02/18 154 lb 6.4 oz (70 kg)     Objective:    Vital Signs:  BP (!) 153/86   Pulse 88   Wt 146 lb (66.2 kg)   BMI 24.30 kg/m    VITAL SIGNS:  reviewed  ASSESSMENT & PLAN:     ARRHYTHMIA -  He has had no arrhythmias.  I am going to have him go get an EKG at his primary care office today.  I do not think this was associated with fibrillation of the nonsustained VT he has had but I have a low threshold to have him wear another monitor in the future.   CORONARY ARTERY DISEASE -   He is not having any chest pain or anginal equivalent.  Check the EKG.  Again I have a low threshold for perfusion imaging with his vague symptoms.   ATRIAL FIB - As above.  I will check a CBC as he has had some GI bleeding in the past.   HYPERLIPIDEMIA -  LDL was not quite at target.  He has been a little bit intolerant of medications and I will discuss this with him in the future we will try stop pravastatin.   ASCENDING AORTIC ANEURYSM- He has a follow-up CT scheduled for later this month.  He has had no pain or symptoms suggestive.   COVID-19 Education: The signs and symptoms of COVID-19 were discussed with the patient and how to seek care for testing (follow up with PCP or arrange E-visit).  The importance of social distancing was discussed today.  Time:   Today, I have spent 17 time with patient (total time 26 minutes) with telehealth technology discussing the above problems.     Medication Adjustments/Labs and Tests Ordered: Current medicines are  reviewed at length with the patient today.  Concerns regarding medicines are outlined above.   Tests Ordered: No orders of the defined types were placed in this encounter.   Medication Changes: No orders of the defined types were placed in this encounter.   Follow Up:  In Person in [place] in one week   Signed, Minus Breeding, MD  12/06/2018 8:11 AM    [place]

## 2018-12-05 NOTE — Telephone Encounter (Signed)
A message was left, re: his new patient appointment. 

## 2018-12-05 NOTE — Progress Notes (Signed)
Chief Complaint:    Indigestion, heartburn, occasional nausea  IMPRESSION and PLAN:    85 year old male Veteran with chronic GERD / esophageal stricture requiring dilation. History of Barrett's esophagus on 2015 esophageal biopsy but no endoscopic evidence for Barrett's on last EGD with dilation in June 2019. Here now with several week history of heartburn, "indigestion", and intermittent nausea despite high dose PPI plus Carafate 2-3 times daily.  Also recent development of pill dysphagia but this is rather random.  Some of his symptoms do sound like refractory GERD -Patient is inquiring about having an EGD and refractory GERD symptoms is an indication for the procedure though doubt presence of significant pathology given findings of last EGD June 2019. Patient had a recent episode of feeling very faint with subsequent home blood pressure reading of 66 / 50's and 02 level in upper 80's.  PCP stopped Diltiazem yesterday and is arranging for an appointment with Cardiologist - Dr. Percival Spanish.  We will hold off on scheduling an EGD until patient evaluated by Cardiology.  He will call us following that visit.  -Continue twice daily PPI and Carafate. -Though currently his dysphagia is mainly to pills and occurs randomly.  I did advise patient to eat small bites, chew well with liquids in between bites to avoid food impaction.   HPI:     Patient is an 85 year old male multiple medical problems not limited to CAD, atrial fibrillation , hyperlipidemia, ascending aortic aneurysm ( 4.7 cm on CT scan 2019), COPD, hypothyroidism , chronic GERD, history of esophageal strictures requiring dilation (last time was June 2019),  3 cm hiatal hernia, Barrett's esophagus on 2015 biopsy.  He is known to Dr. Hilarie Fredrickson but has not been seen here in over a year.    Patient is here with a several week history of postprandial indigestion, frequent heartburn, occasional nausea.  No problem swallowing food but he has  recently developed intermittent pill dysphagia.  No odynophagia.  Prednisone on home med list but patient apparently has not taken it in the recent past.  He is compliant with twice daily PPI plus Carafate 2-3 times a day.  Other than above he has no significant GI complaints.  He does have occasional constipation relieved with suppositories.  Recently while out in the yard, though apparently not exerting himself, patient developed generalized weakness, feeling of being " faint"   He went inside, checked blood pressure which was 66 / 50's.  He checked his oxygen level which was 88.  He had no associated chest pain.  He has chronic shortness of breath, not any worse at the time.  Patient called PCP, diltiazem was stopped and arrangements made for him to see Cardiology.  That appointment has not yet been secured but is in the works  Review of systems:     No chest pain, no SOB, no fevers, no urinary sx   Past Medical History:  Diagnosis Date  . Abnormality of gait 06/07/2013  . Anemia   . Aneurysm ([place])    on assending aorta, currently watching it, Dr Cyndia Bent  . Anxiety   . Arthritis   . Asthma   . Atrial fibrillation ([place])   . Barrett's esophagus   . CAD (coronary artery disease) 03/2006   3.0 x 20 mm TAXUS Perseus DES to the LAD; 01/2007  L main 30%, oLAD 50%, pLAD stent ok, CFX 80%, OM 60%, pRCA 60%, mRCA 70%, oPDA 90%; med rx   . Cancer ([place])  skin cancer   . Cataract    bilateral removal of cateracts  . Complication of anesthesia     no issues,but pt prefers spinal due to Pulmonary problems  . COPD (chronic obstructive pulmonary disease) ([place])    oxygen  on standby in home.  . Depression   . Diverticulosis   . Emphysema   . Enlarged prostate with urinary retention   . Esophageal stenosis   . GERD (gastroesophageal reflux disease)   . Hiatal hernia   . Hypothyroidism   . Neuropathy   . Osteoporosis   . Pituitary macroadenoma ([place])   . Restless legs syndrome (RLS)   . UGIB  (upper gastrointestinal bleed) 03/2013   EGD w/ large ulcer at GE junction    Patient's surgical history, family medical history, social history, medications and allergies were all reviewed in Epic   Current Outpatient Medications  Medication Sig Dispense Refill  . albuterol (PROVENTIL HFA;VENTOLIN HFA) 108 (90 Base) MCG/ACT inhaler Inhale 2 puffs into the lungs every 6 (six) hours as needed for wheezing or shortness of breath. 18 g 2  . bisacodyl (DULCOLAX) 10 MG suppository Place 10 mg rectally as needed for moderate constipation.    . bromocriptine (PARLODEL) 2.5 MG tablet TAKE  (1)  TABLET TWICE A DAY. 60 tablet 0  . budesonide-formoterol (SYMBICORT) 160-4.5 MCG/ACT inhaler Inhale 2 puffs into the lungs 2 (two) times daily. 1 Inhaler 1  . busPIRone (BUSPAR) 15 MG tablet Take 1 tablet (15 mg total) by mouth 2 (two) times daily as needed. 60 tablet 1  . calcium carbonate (OSCAL) 1500 (600 Ca) MG TABS tablet Take 600 mg of elemental calcium by mouth daily with breakfast.     . cholecalciferol (VITAMIN D) 1000 UNITS tablet Take 1,000 Units by mouth daily.     . ciprofloxacin (CIPRO) 500 MG tablet Take 1 tablet (500 mg total) by mouth 2 (two) times daily. 20 tablet 11  . ferrous sulfate 325 (65 FE) MG tablet Take 325 mg by mouth daily with breakfast.     . GAVILAX 17 GM/SCOOP powder DISSOLVE 17 GM SCOOP IN 8 OZ. OF WATER AND DRINK ONCE DAILY AS NEEDED FOR CONSTIPATION 510 g 0  . guaiFENesin (MUCINEX) 600 MG 12 hr tablet Take 600 mg by mouth daily.     Marland Kitchen levalbuterol (XOPENEX) 0.63 MG/3ML nebulizer solution Take 3 mLs (0.63 mg total) by nebulization every 6 (six) hours as needed for wheezing or shortness of breath. 72 mL 1  . levothyroxine (SYNTHROID) 75 MCG tablet Take 1 tablet (75 mcg total) by mouth daily. 90 tablet 1  . magnesium oxide (MAG-OX) 400 (241.3 Mg) MG tablet Take 400 mg by mouth every other day.    . montelukast (SINGULAIR) 10 MG tablet     . naphazoline-pheniramine (NAPHCON-A)  0.025-0.3 % ophthalmic solution Place 1 drop into both eyes 2 (two) times daily as needed for irritation or allergies.     . nitroGLYCERIN (NITROSTAT) 0.4 MG SL tablet Place 1 tablet (0.4 mg total) under the tongue every 5 (five) minutes as needed for chest pain. 25 tablet 2  . pantoprazole (PROTONIX) 40 MG tablet TAKE (1) TABLET TWICE A DAY BEFORE MEALS. 180 tablet 0  . predniSONE (STERAPRED UNI-PAK 21 TAB) 10 MG (21) TBPK tablet As directed x 6 days 21 tablet 0  . Respiratory Therapy Supplies (FLUTTER) DEVI Twice a day and prn as needed, may increase if feeling worse 1 each 0  . silodosin (RAPAFLO) 4 MG CAPS  capsule     . sucralfate (CARAFATE) 1 g tablet TAKE (1) TABLET FOUR TIMES DAILY AS NEEDED FOR GI UPSET 120 tablet 0  . triamcinolone cream (KENALOG) 0.1 % Apply 1 application topically 2 (two) times daily. 30 g 0  . vitamin B-12 (CYANOCOBALAMIN) 1000 MCG tablet Take 1,000 mcg by mouth daily.    . vitamin E (VITAMIN E) 400 UNIT capsule Take 400 Units by mouth daily.     No current facility-administered medications for this visit.     Physical Exam:     BP 112/74 (BP Location: Left Arm, Patient Position: Sitting, Cuff Size: Normal)   Pulse 84   Temp 97.7 F (36.5 C) (Oral)   Ht 5\' 5"  (1.651 m) Comment: measured today  Wt 152 lb 4 oz (69.1 kg)   BMI 25.34 kg/m   GENERAL:  Pleasant male in NAD PSYCH: : Cooperative, normal affect EENT:  conjunctiva pink, mucous membranes moist, neck supple without masses.  No evidence for oral candida CARDIAC:  RRR,  no peripheral edema PULM: Normal respiratory effort, lungs CTA bilaterally, no wheezing ABDOMEN:  Nondistended, soft, nontender. No obvious masses, no hepatomegaly,  normal bowel sounds SKIN:  turgor, no lesions seen Musculoskeletal:  Normal muscle tone, normal strength NEURO: Alert and oriented x 3, no focal neurologic deficits  I spent 25 minutes of face-to-face time with the patient. Greater than 50% of the time was spent  counseling and coordinating care. Questions answered  Tye Savoy , NP 12/05/2018, 10:41 AM

## 2018-12-06 ENCOUNTER — Telehealth: Payer: Self-pay | Admitting: Cardiology

## 2018-12-06 ENCOUNTER — Ambulatory Visit: Payer: Medicare Other

## 2018-12-06 ENCOUNTER — Encounter: Payer: Self-pay | Admitting: Cardiology

## 2018-12-06 ENCOUNTER — Telehealth (INDEPENDENT_AMBULATORY_CARE_PROVIDER_SITE_OTHER): Payer: Medicare Other | Admitting: Cardiology

## 2018-12-06 ENCOUNTER — Other Ambulatory Visit: Payer: Self-pay

## 2018-12-06 VITALS — BP 153/86 | HR 88 | Wt 146.0 lb

## 2018-12-06 DIAGNOSIS — I712 Thoracic aortic aneurysm, without rupture: Secondary | ICD-10-CM

## 2018-12-06 DIAGNOSIS — I480 Paroxysmal atrial fibrillation: Secondary | ICD-10-CM

## 2018-12-06 DIAGNOSIS — I2510 Atherosclerotic heart disease of native coronary artery without angina pectoris: Secondary | ICD-10-CM | POA: Diagnosis not present

## 2018-12-06 DIAGNOSIS — R531 Weakness: Secondary | ICD-10-CM

## 2018-12-06 DIAGNOSIS — E785 Hyperlipidemia, unspecified: Secondary | ICD-10-CM

## 2018-12-06 DIAGNOSIS — I7121 Aneurysm of the ascending aorta, without rupture: Secondary | ICD-10-CM

## 2018-12-06 NOTE — Telephone Encounter (Signed)
Attempted to reach pt to get ready for VV with Dr. Percival Spanish this morning at 8:20. No answer on either number. Left vm on mobile number to call back and let him know I will call again in a few minutes.

## 2018-12-06 NOTE — Progress Notes (Signed)
Addendum: Reviewed and agree with assessment and management plan. Makaelah Cranfield M, MD  

## 2018-12-06 NOTE — Patient Instructions (Addendum)
Medication Instructions:  Your physician recommends that you continue on your current medications as directed. Please refer to the Current Medication list given to you today.  If you need a refill on your cardiac medications before your next appointment, please call your pharmacy.   Lab work: CBC @ [facility] If you have labs (blood work) drawn today and your tests are completely normal, you will receive your results only by: [employer] (if you have MyChart) OR A paper copy in the mail If you have any lab test that is abnormal or we need to change your treatment, we will call you to review the results.  Testing/Procedures: EKG @ [facility]   Follow up:   Wednesday 12/12/18 @ 11:00am in the [facility].

## 2018-12-06 NOTE — Telephone Encounter (Signed)
Spoke with jan, patient EKG is complete and has been scanned into epic. Will make dr hochrein aware.

## 2018-12-06 NOTE — Progress Notes (Signed)
Per Dr. Rosezella [place] order, EKG was done.  Contacted Dr. Rosezella [place] office and spoke with triage nurse, Hilda Blades, and advised her that EKG was complete and available for viewing by them.

## 2018-12-06 NOTE — Telephone Encounter (Signed)
Kayla, RN was able to reach the pt for VV.

## 2018-12-07 ENCOUNTER — Other Ambulatory Visit: Payer: Self-pay

## 2018-12-07 LAB — CBC
Hematocrit: 37.5 % (ref 37.5–51.0)
Hemoglobin: 12.3 g/dL — ABNORMAL LOW (ref 13.0–17.7)
MCH: 31.9 pg (ref 26.6–33.0)
MCHC: 32.8 g/dL (ref 31.5–35.7)
MCV: 97 fL (ref 79–97)
Platelets: 233 10*3/uL (ref 150–450)
RBC: 3.85 x10E6/uL — ABNORMAL LOW (ref 4.14–5.80)
RDW: 14.1 % (ref 11.6–15.4)
WBC: 8.6 10*3/uL (ref 3.4–10.8)

## 2018-12-10 ENCOUNTER — Encounter: Payer: Self-pay | Admitting: Family Medicine

## 2018-12-10 ENCOUNTER — Other Ambulatory Visit: Payer: Self-pay

## 2018-12-10 ENCOUNTER — Ambulatory Visit (INDEPENDENT_AMBULATORY_CARE_PROVIDER_SITE_OTHER): Payer: Medicare Other | Admitting: Family Medicine

## 2018-12-10 VITALS — BP 134/84 | HR 80 | Temp 97.8°F | Ht 65.0 in | Wt 151.8 lb

## 2018-12-10 DIAGNOSIS — Z23 Encounter for immunization: Secondary | ICD-10-CM

## 2018-12-10 DIAGNOSIS — I959 Hypotension, unspecified: Secondary | ICD-10-CM

## 2018-12-10 DIAGNOSIS — I952 Hypotension due to drugs: Secondary | ICD-10-CM

## 2018-12-10 NOTE — Progress Notes (Signed)
BP 134/84    Pulse 80    Temp 97.8 F (36.6 C) (Temporal)    Ht 5\' 5"  (1.651 m)    Wt 151 lb 12.8 oz (68.9 kg)    SpO2 98%    BMI 25.26 kg/m    Subjective:   Patient ID: [name], male    DOB: 01-May-1932, 85 y.o.   MRN: [id number]  HPI: [name] is a 85 y.o. male presenting on 12/10/2018 for Hypotension (1 week follow up)   HPI Follow-up on hypertension Was seen in a virtual visit a week ago for lightheadedness and dizziness and that day he was working outside and then he had blood pressures that were 74/53 and he does generally felt weak and then he had 58/47 with an 87% oxygen and then 67/48 and took most of the day to finally get back up over the low 100s on his blood pressure.  He did a virtual visit with Particia Nearing and she recommended he stop his diltiazem.  Since that time he still has felt lightheaded some but not as much as weak.  He still gets vasovagal when he stands up quick but he just is learned to be cautious about that.  He did have a visit with his cardiologist virtually as well and then he ordered an EKG which shows no major abnormalities and looks to be good.  He says he is feeling less weak and better and his blood pressure today is 134/84 with a heart rate of 80.  Relevant past medical, surgical, family and social history reviewed and updated as indicated. Interim medical history since our last visit reviewed. Allergies and medications reviewed and updated.  Review of Systems  Constitutional: Negative for chills, fatigue and fever.  Respiratory: Negative for shortness of breath and wheezing.   Cardiovascular: Negative for chest pain, palpitations and leg swelling.  Musculoskeletal: Negative for back pain and gait problem.  Skin: Negative for rash.  Neurological: Positive for dizziness and light-headedness. Negative for weakness and numbness.  All other systems reviewed and are negative.   Per HPI unless specifically indicated above   Allergies as  of 12/10/2018      Reactions   Betadine [povidone Iodine] Other (See Comments)   Reaction:  Blisters    Gadavist [gadobutrol]    hives   Lortab [hydrocodone-acetaminophen] Nausea And Vomiting   Penicillins Other (See Comments)   Reaction:  Lightheadedness  Has patient had a PCN reaction causing immediate rash, facial/tongue/throat swelling, SOB or lightheadedness with hypotension: Yes Has patient had a PCN reaction causing severe rash involving mucus membranes or skin necrosis: No Has patient had a PCN reaction that required hospitalization No Has patient had a PCN reaction occurring within the last 10 years: No If all of the above answers are "NO", then may proceed with Cephalosporin use.   Zetia [ezetimibe] Other (See Comments)   Reaction:  Muscle weakness    Zocor [simvastatin] Nausea Only, Other (See Comments)   Reaction:  Muscle weakness       Medication List       Accurate as of December 10, 2018  2:26 PM. If you have any questions, ask your nurse or doctor.        STOP taking these medications   ciprofloxacin 500 MG tablet Commonly known as: CIPRO Stopped by: [name] Wilna Pennie, MD   predniSONE 10 MG (21) Tbpk tablet Commonly known as: STERAPRED UNI-PAK 21 TAB Stopped by: Worthy Rancher, MD  TAKE these medications   albuterol 108 (90 Base) MCG/ACT inhaler Commonly known as: VENTOLIN HFA Inhale 2 puffs into the lungs every 6 (six) hours as needed for wheezing or shortness of breath.   bisacodyl 10 MG suppository Commonly known as: DULCOLAX Place 10 mg rectally as needed for moderate constipation.   bromocriptine 2.5 MG tablet Commonly known as: PARLODEL TAKE  (1)  TABLET TWICE A DAY.   budesonide-formoterol 160-4.5 MCG/ACT inhaler Commonly known as: Symbicort Inhale 2 puffs into the lungs 2 (two) times daily.   busPIRone 15 MG tablet Commonly known as: BUSPAR Take 1 tablet (15 mg total) by mouth 2 (two) times daily as needed.   calcium carbonate  1500 (600 Ca) MG Tabs tablet Commonly known as: OSCAL Take 600 mg of elemental calcium by mouth daily with breakfast.   cholecalciferol 1000 units tablet Commonly known as: VITAMIN D Take 1,000 Units by mouth daily.   ferrous sulfate 325 (65 FE) MG tablet Take 325 mg by mouth daily with breakfast.   Flutter Devi Twice a day and prn as needed, may increase if feeling worse   GaviLAX 17 GM/SCOOP powder Generic drug: polyethylene glycol powder DISSOLVE 17 GM SCOOP IN 8 OZ. OF WATER AND DRINK ONCE DAILY AS NEEDED FOR CONSTIPATION   guaiFENesin 600 MG 12 hr tablet Commonly known as: MUCINEX Take 600 mg by mouth daily.   levalbuterol 0.63 MG/3ML nebulizer solution Commonly known as: Xopenex Take 3 mLs (0.63 mg total) by nebulization every 6 (six) hours as needed for wheezing or shortness of breath.   levothyroxine 75 MCG tablet Commonly known as: SYNTHROID Take 1 tablet (75 mcg total) by mouth daily.   magnesium oxide 400 (241.3 Mg) MG tablet Commonly known as: MAG-OX Take 400 mg by mouth every other day.   montelukast 10 MG tablet Commonly known as: SINGULAIR   naphazoline-pheniramine 0.025-0.3 % ophthalmic solution Commonly known as: NAPHCON-A Place 1 drop into both eyes 2 (two) times daily as needed for irritation or allergies.   nitroGLYCERIN 0.4 MG SL tablet Commonly known as: NITROSTAT Place 1 tablet (0.4 mg total) under the tongue every 5 (five) minutes as needed for chest pain.   pantoprazole 40 MG tablet Commonly known as: PROTONIX TAKE (1) TABLET TWICE A DAY BEFORE MEALS.   silodosin 4 MG Caps capsule Commonly known as: RAPAFLO   sucralfate 1 g tablet Commonly known as: CARAFATE TAKE (1) TABLET FOUR TIMES DAILY AS NEEDED FOR GI UPSET   triamcinolone cream 0.1 % Commonly known as: KENALOG Apply 1 application topically 2 (two) times daily.   vitamin B-12 1000 MCG tablet Commonly known as: CYANOCOBALAMIN Take 1,000 mcg by mouth daily.   vitamin E 400  UNIT capsule Generic drug: vitamin E Take 400 Units by mouth daily.        Objective:   BP 134/84    Pulse 80    Temp 97.8 F (36.6 C) (Temporal)    Ht 5\' 5"  (1.651 m)    Wt 151 lb 12.8 oz (68.9 kg)    SpO2 98%    BMI 25.26 kg/m   Wt Readings from Last 3 Encounters:  12/10/18 151 lb 12.8 oz (68.9 kg)  12/06/18 146 lb (66.2 kg)  12/05/18 152 lb 4 oz (69.1 kg)    Physical Exam Vitals signs and nursing note reviewed.  Constitutional:      General: He is not in acute distress.    Appearance: He is well-developed. He is not diaphoretic.  Eyes:  General: No scleral icterus.    Conjunctiva/sclera: Conjunctivae normal.  Neck:     Thyroid: No thyromegaly.  Cardiovascular:     Rate and Rhythm: Normal rate and regular rhythm.     Heart sounds: Normal heart sounds. No murmur.  Pulmonary:     Effort: Pulmonary effort is normal. No respiratory distress.     Breath sounds: Normal breath sounds. No wheezing.  Skin:    General: Skin is warm and dry.     Findings: No rash.  Neurological:     Mental Status: He is alert and oriented to person, place, and time.     Cranial Nerves: No cranial nerve deficit.     Sensory: No sensory deficit.     Motor: No weakness.     Coordination: Coordination normal.     Gait: Gait normal.  Psychiatric:        Behavior: Behavior normal.       Assessment & Plan:   Problem List Items Addressed This Visit      Cardiovascular and Mediastinum   HYPOTENSION - Primary    Other Visit Diagnoses    Need for immunization against influenza       Relevant Orders   Flu Vaccine QUAD High Dose(Fluad) (Completed)      Still continue to not take his diltiazem Follow up plan: Return if symptoms worsen or fail to improve.  Counseling provided for all of the vaccine components Orders Placed This Encounter  Procedures   Flu Vaccine QUAD High Dose(Fluad)    Caryl Pina, MD [facility] 12/10/2018, 2:26 PM

## 2018-12-10 NOTE — Telephone Encounter (Signed)
MW please advise.  Thanks.  

## 2018-12-11 NOTE — Progress Notes (Signed)
Cardiology Office Note   Date:  12/12/2018   ID:  [name], DOB April 10, 1932, MRN [id number]  PCP:  [name], Allen Kaufmann, MD  Cardiologist:   Minus Breeding, MD   Chief Complaint  Patient presents with  . Dizziness      History of Present Illness: [name] is a 85 y.o. male who presents for who presents for followup of his known coronary disease. He had a negative stress perfusion study in 2017.   He wore a Holter.  There were PVCs, non sustained V tach and atrial tach and PVCs/PACs.   Echo at that time was essentially unremarkable.  He had episodes of light headedness.  He had a transient low BP and had a borderline saturation briefly.  I sent him for an EKG which was unremarkable.  He was not anemic.  He returns for follow up.    Since that event he has had no further events.  He walks [name] his support dog about a dry weight. The patient denies any new symptoms such as chest discomfort, neck or arm discomfort. There has been no new shortness of breath, PND or orthopnea. There have been no reported palpitations, presyncope or syncope.     Past Medical History:  Diagnosis Date  . Abnormality of gait 06/07/2013  . Anemia   . Aneurysm ([place])    on assending aorta, currently watching it, Dr Cyndia Bent  . Anxiety   . Arthritis   . Asthma   . Atrial fibrillation ([place])   . Barrett's esophagus   . CAD (coronary artery disease) 03/2006   3.0 x 20 mm TAXUS Perseus DES to the LAD; 01/2007  L main 30%, oLAD 50%, pLAD stent ok, CFX 80%, OM 60%, pRCA 60%, mRCA 70%, oPDA 90%; med rx   . Cancer ([place])    skin cancer   . Cataract    bilateral removal of cateracts  . Complication of anesthesia     no issues,but pt prefers spinal due to Pulmonary problems  . COPD (chronic obstructive pulmonary disease) ([place])    oxygen  on standby in home.  . Depression   . Diverticulosis   . Emphysema   . Enlarged prostate with urinary retention   . Esophageal stenosis   . GERD  (gastroesophageal reflux disease)   . Hiatal hernia   . Hypothyroidism   . Neuropathy   . Osteoporosis   . Pituitary macroadenoma ([place])   . Restless legs syndrome (RLS)   . UGIB (upper gastrointestinal bleed) 03/2013   EGD w/ large ulcer at GE junction    Past Surgical History:  Procedure Laterality Date  . ABDOMINAL HERNIA REPAIR   2008  . CARDIAC CATHETERIZATION  01/2007   L main 30%, oLAD 50%,  pLAD stent ok, CFX 80%, OM 60%, pRCA 60%, mRCA 70%, oPDA 90%; med rx  . cataract extraction both eyes    . COLONOSCOPY    . CORONARY STENT PLACEMENT  03/2006   3.0 x 20 mm TAXUS Perseus DES to the LAD  . CYSTOSCOPY N/A 06/10/2015   Procedure: CYSTOSCOPY FULGRATION OF BLEEDING,  electovapor resection;  Surgeon: Irine Seal, MD;  Location: [facility];  Service: Urology;  Laterality: N/A;  . CYSTOSCOPY WITH INSERTION OF UROLIFT N/A 06/01/2015   Procedure: CYSTOSCOPY WITH INSERTION OF UROLIFT x4;  Surgeon: Irine Seal, MD;  Location: [facility];  Service: Urology;  Laterality: N/A;  . ESOPHAGOGASTRODUODENOSCOPY N/A 04/20/2013   Procedure: ESOPHAGOGASTRODUODENOSCOPY (EGD);  Surgeon: Sandy Salaam  Deatra Ina, MD;  Location: [facility];  Service: Endoscopy;  Laterality: N/A;  . ESOPHAGOGASTRODUODENOSCOPY N/A 03/25/2016   Procedure: ESOPHAGOGASTRODUODENOSCOPY (EGD);  Surgeon: Irene Shipper, MD;  Location: [facility];  Service: Endoscopy;  Laterality: N/A;  . ESOPHAGOGASTRODUODENOSCOPY (EGD) WITH PROPOFOL N/A 10/13/2015   Procedure: ESOPHAGOGASTRODUODENOSCOPY (EGD) WITH PROPOFOL;  Surgeon: Jerene Bears, MD;  Location: [facility];  Service: Gastroenterology;  Laterality: N/A;  . FOOT SURGERY  1994 left, 2002 right foot   bilateral foot reconstruciton  . FOOT SURGERY     reconstruction of both feet- no retained hardware.  Marland Kitchen HIATAL HERNIA REPAIR  01-04-2008  . MELANOMA EXCISION  2019  . POLYPECTOMY    . PROSTATE SURGERY     x 2  . SAVORY DILATION N/A 03/25/2016   Procedure: SAVORY DILATION;  Surgeon: Irene Shipper,  MD;  Location: [facility];  Service: Endoscopy;  Laterality: N/A;  . TOTAL HIP ARTHROPLASTY  07/21/2011   Procedure: TOTAL HIP ARTHROPLASTY ANTERIOR APPROACH;  Surgeon: Mauri Pole, MD;  Location: [facility];  Service: Orthopedics;  Laterality: Left;  . TOTAL HIP ARTHROPLASTY Right 09/07/2012   Procedure: RIGHT TOTAL HIP ARTHROPLASTY ANTERIOR APPROACH;  Surgeon: Mcarthur Rossetti, MD;  Location: [facility];  Service: Orthopedics;  Laterality: Right;  . TRANSURETHRAL RESECTION OF BLADDER NECK N/A 11/04/2015   Procedure: RESECTION OF BLADDER NECK;  Surgeon: Cleon Gustin, MD;  Location: [facility];  Service: Urology;  Laterality: N/A;  . TRANSURETHRAL RESECTION OF PROSTATE N/A 11/04/2015   Procedure: TRANSURETHRAL RESECTION OF THE PROSTATE (TURP); REMOVAL OF UROLIFT IMPLANTS X THREE;  Surgeon: Cleon Gustin, MD;  Location: [facility];  Service: Urology;  Laterality: N/A;  . UPPER GASTROINTESTINAL ENDOSCOPY  12/2013   Dr Hilarie Fredrickson, gastritis  . VIDEO BRONCHOSCOPY Bilateral 02/01/2017   Procedure: VIDEO BRONCHOSCOPY WITHOUT FLUORO;  Surgeon: Tanda Rockers, MD;  Location: [facility];  Service: Cardiopulmonary;  Laterality: Bilateral;     Current Outpatient Medications  Medication Sig Dispense Refill  . albuterol (PROVENTIL HFA;VENTOLIN HFA) 108 (90 Base) MCG/ACT inhaler Inhale 2 puffs into the lungs every 6 (six) hours as needed for wheezing or shortness of breath. 18 g 2  . bisacodyl (DULCOLAX) 10 MG suppository Place 10 mg rectally as needed for moderate constipation.    . bromocriptine (PARLODEL) 2.5 MG tablet TAKE  (1)  TABLET TWICE A DAY. 60 tablet 0  . budesonide-formoterol (SYMBICORT) 160-4.5 MCG/ACT inhaler Inhale 2 puffs into the lungs 2 (two) times daily. 1 Inhaler 1  . busPIRone (BUSPAR) 15 MG tablet Take 1 tablet (15 mg total) by mouth 2 (two) times daily as needed. 60 tablet 1  . calcium carbonate (OSCAL) 1500 (600 Ca) MG TABS tablet Take 600 mg of elemental calcium by mouth daily with  breakfast.     . cholecalciferol (VITAMIN D) 1000 UNITS tablet Take 1,000 Units by mouth daily.     . ferrous sulfate 325 (65 FE) MG tablet Take 325 mg by mouth daily with breakfast.     . GAVILAX 17 GM/SCOOP powder DISSOLVE 17 GM SCOOP IN 8 OZ. OF WATER AND DRINK ONCE DAILY AS NEEDED FOR CONSTIPATION 510 g 0  . guaiFENesin (MUCINEX) 600 MG 12 hr tablet Take 600 mg by mouth daily.     Marland Kitchen levalbuterol (XOPENEX) 0.63 MG/3ML nebulizer solution Take 3 mLs (0.63 mg total) by nebulization every 6 (six) hours as needed for wheezing or shortness of breath. 72 mL 1  . levothyroxine (SYNTHROID) 75 MCG tablet Take  1 tablet (75 mcg total) by mouth daily. 90 tablet 1  . magnesium oxide (MAG-OX) 400 (241.3 Mg) MG tablet Take 400 mg by mouth every other day.    . montelukast (SINGULAIR) 10 MG tablet     . naphazoline-pheniramine (NAPHCON-A) 0.025-0.3 % ophthalmic solution Place 1 drop into both eyes 2 (two) times daily as needed for irritation or allergies.     . nitroGLYCERIN (NITROSTAT) 0.4 MG SL tablet Place 1 tablet (0.4 mg total) under the tongue every 5 (five) minutes as needed for chest pain. 25 tablet 2  . pantoprazole (PROTONIX) 40 MG tablet TAKE (1) TABLET TWICE A DAY BEFORE MEALS. 180 tablet 0  . silodosin (RAPAFLO) 4 MG CAPS capsule     . sucralfate (CARAFATE) 1 g tablet TAKE (1) TABLET FOUR TIMES DAILY AS NEEDED FOR GI UPSET 120 tablet 0  . triamcinolone cream (KENALOG) 0.1 % Apply 1 application topically 2 (two) times daily. 30 g 0  . vitamin B-12 (CYANOCOBALAMIN) 1000 MCG tablet Take 1,000 mcg by mouth daily.    . vitamin E (VITAMIN E) 400 UNIT capsule Take 400 Units by mouth daily.    Marland Kitchen Respiratory Therapy Supplies (FLUTTER) DEVI Twice a day and prn as needed, may increase if feeling worse 1 each 0   No current facility-administered medications for this visit.     Allergies:   Betadine [povidone iodine], Gadavist [gadobutrol], Lortab [hydrocodone-acetaminophen], Penicillins, Zetia [ezetimibe],  and Zocor [simvastatin]    ROS:  Please see the history of present illness.   Otherwise, review of systems are positive for none.   All other systems are reviewed and negative.    PHYSICAL EXAM: VS:  BP 110/62   Pulse 72   Ht 5\' 3"  (1.6 m)   Wt 152 lb (68.9 kg)   BMI 26.93 kg/m  , BMI Body mass index is 26.93 kg/m. GENERAL:  Well appearing NECK:  No jugular venous distention, waveform within normal limits, carotid upstroke brisk and symmetric, no bruits, no thyromegaly LUNGS:  Clear to auscultation bilaterally CHEST:  Unremarkable HEART:  PMI not displaced or sustained,S1 and S2 within normal limits, no S3, no S4, no clicks, no rubs, no murmurs ABD:  Flat, positive bowel sounds normal in frequency in pitch, no bruits, no rebound, no guarding, no midline pulsatile mass, no hepatomegaly, no splenomegaly EXT:  2 plus pulses throughout, no edema, no cyanosis no clubbing   EKG:  EKG is not a new ordered today.   Recent Labs: 09/19/2018: ALT 16; BUN 11; Creatinine, Ser 0.90; Potassium 4.2; Sodium 140; TSH 5.680 12/06/2018: Hemoglobin 12.3; Platelets 233    Lipid Panel    Component Value Date/Time   CHOL 167 09/19/2018 0804   TRIG 56 09/19/2018 0804   TRIG 71 04/01/2015 0934   HDL 57 09/19/2018 0804   HDL 56 04/01/2015 0934   CHOLHDL 2.9 09/19/2018 0804   CHOLHDL 2.9 06/12/2015 0529   VLDL 9 06/12/2015 0529   LDLCALC 99 09/19/2018 0804   LDLCALC 121 (H) 08/29/2013 0909      Wt Readings from Last 3 Encounters:  12/12/18 152 lb (68.9 kg)  12/10/18 151 lb 12.8 oz (68.9 kg)  12/06/18 146 lb (66.2 kg)      Other studies Reviewed: Additional studies/ records that were reviewed today include: None. Review of the above records demonstrates:  Please see elsewhere in the note.     ASSESSMENT AND PLAN:  ARRHYTHMIA -  He is no longer having palpitations.  He has  not felt any fibrillation.  I do not know what this event was the other day no change in therapy.   CORONARY  ARTERY DISEASE - The patient has no new sypmtoms.  No further cardiovascular testing is indicated.  We will continue with aggressive risk reduction and meds as listed.  ATRIAL FIB- He has not had any symptoms of this.  He had GI bleed so he is not taking anticoagulants.   ASCENDING AORTIC ANEURYSM- He has a follow-up CT scheduled for later this month.     Current medicines are reviewed at length with the patient today.  The patient does not have concerns regarding medicines.  The following changes have been made:  no change  Labs/ tests ordered today include: None No orders of the defined types were placed in this encounter.    Disposition:   FU with me in six months.     Signed, Minus Breeding, MD  12/12/2018 12:20 PM    [facility]

## 2018-12-12 ENCOUNTER — Other Ambulatory Visit: Payer: Self-pay

## 2018-12-12 ENCOUNTER — Encounter: Payer: Self-pay | Admitting: Cardiology

## 2018-12-12 ENCOUNTER — Ambulatory Visit: Payer: Medicare Other | Admitting: Cardiology

## 2018-12-12 VITALS — BP 110/62 | HR 72 | Ht 63.0 in | Wt 152.0 lb

## 2018-12-12 DIAGNOSIS — I7121 Aneurysm of the ascending aorta, without rupture: Secondary | ICD-10-CM

## 2018-12-12 DIAGNOSIS — I712 Thoracic aortic aneurysm, without rupture: Secondary | ICD-10-CM | POA: Diagnosis not present

## 2018-12-12 DIAGNOSIS — I480 Paroxysmal atrial fibrillation: Secondary | ICD-10-CM | POA: Diagnosis not present

## 2018-12-12 DIAGNOSIS — R002 Palpitations: Secondary | ICD-10-CM

## 2018-12-12 DIAGNOSIS — I2510 Atherosclerotic heart disease of native coronary artery without angina pectoris: Secondary | ICD-10-CM | POA: Diagnosis not present

## 2018-12-12 NOTE — Patient Instructions (Signed)
Medication Instructions:  The current medical regimen is effective;  continue present plan and medications.  If you need a refill on your cardiac medications before your next appointment, please call your pharmacy.   Follow-Up: Follow up in 6 months with Dr. Hochrein.  You will receive a letter in the mail 2 months before you are due.  Please call us when you receive this letter to schedule your follow up appointment.  Thank you for choosing [facility]!!      

## 2018-12-13 ENCOUNTER — Other Ambulatory Visit: Payer: Self-pay | Admitting: Family Medicine

## 2018-12-21 ENCOUNTER — Other Ambulatory Visit: Payer: Self-pay | Admitting: Family Medicine

## 2018-12-26 ENCOUNTER — Encounter: Payer: Self-pay | Admitting: Surgery

## 2018-12-26 ENCOUNTER — Ambulatory Visit: Payer: Medicare Other | Admitting: Surgery

## 2018-12-26 ENCOUNTER — Other Ambulatory Visit: Payer: Self-pay

## 2018-12-26 ENCOUNTER — Ambulatory Visit
Admission: RE | Admit: 2018-12-26 | Discharge: 2018-12-26 | Disposition: A | Discharge status: Home / Self Care | Payer: Medicare Other | Source: Ambulatory Visit | Attending: Surgery | Admitting: Surgery

## 2018-12-26 VITALS — BP 124/72 | HR 90 | Temp 97.7°F | Resp 20 | Ht 63.0 in | Wt 155.0 lb

## 2018-12-26 DIAGNOSIS — I712 Thoracic aortic aneurysm, without rupture, unspecified: Secondary | ICD-10-CM

## 2018-12-26 NOTE — Progress Notes (Signed)
HPI:  The patient is an 85 year old gentleman who has a fusiform ascending aortic aneurysm that is measured from 4.4 to 4.7 cm over the years dating back to 2011 when it was measured at 4.4 cm.  He continues to feel well and is walking without chest pain or shortness of breath.  Current Outpatient Medications  Medication Sig Dispense Refill  . albuterol (PROVENTIL HFA;VENTOLIN HFA) 108 (90 Base) MCG/ACT inhaler Inhale 2 puffs into the lungs every 6 (six) hours as needed for wheezing or shortness of breath. 18 g 2  . bisacodyl (DULCOLAX) 10 MG suppository Place 10 mg rectally as needed for moderate constipation.    . bromocriptine (PARLODEL) 2.5 MG tablet TAKE  (1)  TABLET TWICE A DAY. 60 tablet 1  . budesonide-formoterol (SYMBICORT) 160-4.5 MCG/ACT inhaler Inhale 2 puffs into the lungs 2 (two) times daily. 1 Inhaler 1  . busPIRone (BUSPAR) 15 MG tablet TAKE 1 TABLET TWICE DAILY AS NEEDED 60 tablet 0  . calcium carbonate (OSCAL) 1500 (600 Ca) MG TABS tablet Take 600 mg of elemental calcium by mouth daily with breakfast.     . cholecalciferol (VITAMIN D) 1000 UNITS tablet Take 1,000 Units by mouth daily.     . ferrous sulfate 325 (65 FE) MG tablet Take 325 mg by mouth daily with breakfast.     . GAVILAX 17 GM/SCOOP powder DISSOLVE 17 GM SCOOP IN 8 OZ. OF WATER AND DRINK ONCE DAILY AS NEEDED FOR CONSTIPATION 510 g 0  . guaiFENesin (MUCINEX) 600 MG 12 hr tablet Take 600 mg by mouth daily.     Marland Kitchen levalbuterol (XOPENEX) 0.63 MG/3ML nebulizer solution Take 3 mLs (0.63 mg total) by nebulization every 6 (six) hours as needed for wheezing or shortness of breath. 72 mL 1  . levothyroxine (SYNTHROID) 75 MCG tablet Take 1 tablet (75 mcg total) by mouth daily. 90 tablet 1  . magnesium oxide (MAG-OX) 400 (241.3 Mg) MG tablet Take 400 mg by mouth every other day.    . montelukast (SINGULAIR) 10 MG tablet     . naphazoline-pheniramine (NAPHCON-A) 0.025-0.3 % ophthalmic solution Place 1 drop into both eyes  2 (two) times daily as needed for irritation or allergies.     . nitroGLYCERIN (NITROSTAT) 0.4 MG SL tablet Place 1 tablet (0.4 mg total) under the tongue every 5 (five) minutes as needed for chest pain. 25 tablet 2  . pantoprazole (PROTONIX) 40 MG tablet TAKE (1) TABLET TWICE A DAY BEFORE MEALS. 180 tablet 0  . Respiratory Therapy Supplies (FLUTTER) DEVI Twice a day and prn as needed, may increase if feeling worse 1 each 0  . silodosin (RAPAFLO) 4 MG CAPS capsule     . sucralfate (CARAFATE) 1 g tablet TAKE (1) TABLET FOUR TIMES DAILY AS NEEDED FOR GI UPSET 120 tablet 0  . triamcinolone cream (KENALOG) 0.1 % Apply 1 application topically 2 (two) times daily. 30 g 0  . vitamin B-12 (CYANOCOBALAMIN) 1000 MCG tablet Take 1,000 mcg by mouth daily.    . vitamin E (VITAMIN E) 400 UNIT capsule Take 400 Units by mouth daily.     No current facility-administered medications for this visit.      Physical Exam: BP 124/72   Pulse 90   Temp 97.7 F (36.5 C) (Skin)   Resp 20   Ht 5\' 3"  (1.6 m)   Wt 155 lb (70.3 kg)   SpO2 95% Comment: RA  BMI 27.46 kg/m  He looks well. Cardiac exam  shows a regular rate and rhythm with normal heart sounds.  There is no murmur. Lungs are clear.  Diagnostic Tests:  CLINICAL DATA:  Thoracic aortic aneurysm.  EXAM: CT CHEST WITHOUT CONTRAST  TECHNIQUE: Multidetector CT imaging of the chest was performed following the standard protocol without IV contrast.  COMPARISON:  12/27/2017.  FINDINGS: Cardiovascular: Atherosclerotic calcification of the aorta and coronary arteries. Ascending aorta measures 4.4 cm, stable. Pulmonic trunk is enlarged. Heart size within normal limits. No pericardial effusion.  Mediastinum/Nodes: No pathologically enlarged mediastinal or axillary lymph nodes. Hilar regions are difficult to definitively evaluate without IV contrast. Esophagus is unremarkable.  Lungs/Pleura: Biapical pleuroparenchymal scarring. Pleural  calcifications are seen bilaterally. Chronic appearing areas of nodular consolidation and mucoid impaction in both lower lobes. Peribronchovascular ground-glass in the lower lobes appears improved on the right and slightly worsened on the left when compared with 12/27/2017. Calcified granulomas. No pleural fluid. Debris is seen in the lower lobe bronchi bilaterally. Airway is otherwise unremarkable.  Upper Abdomen: Visualized portions of the liver, gallbladder, adrenal glands, kidneys, spleen and pancreas are unremarkable. Postoperative changes in the proximal stomach with a small hiatal hernia. Large duodenal diverticulum. Chronically elevated left hemidiaphragm.  Musculoskeletal: Degenerative changes in the spine. T8 compression deformity is unchanged. No worrisome lytic or sclerotic lesions. Old left rib fractures.  IMPRESSION: 1. 4.4 cm ascending Aortic aneurysm NOS (ICD10-I71.9), stable. Recommend annual imaging followup by CTA or MRA. This recommendation follows 2010 ACCF/AHA/AATS/ACR/ASA/SCA/SCAI/SIR/STS/SVM Guidelines for the Diagnosis and Management of Patients with Thoracic Aortic Disease. Circulation. 2010; [id number]. Aortic aneurysm NOS (ICD10-I71.9). 2. Debris filled bronchi, mucoid impaction and peribronchovascular ground-glass/consolidation in the lower lobes, findings which may be due to chronic repeated aspiration. 3. Asbestos related pleural disease. 4. Aortic atherosclerosis (ICD10-170.0). Coronary artery calcification. 5. Enlarged pulmonic trunk, indicative of pulmonary arterial hypertension.   Electronically Signed   By: Lorin Picket M.D.   On: 12/26/2018 13:13   Impression:  This 85 year old gentleman has a stable 4.4 cm fusiform ascending aortic aneurysm which is not changed significantly since 2011.  His blood pressure is under good control.  I reviewed the CT images with the patient and his daughter and answered their questions.  I  stressed the importance of continued good blood pressure control and preventing further enlargement and acute aortic dissection.  I told him that I think it is reasonable to wait 2 years to follow-up since this has been completely stable since 2011.  He is in agreement with that.  Plan:  He will return to see me in 2 years with a CT scan of the chest without contrast.  I spent 15 minutes performing this established patient evaluation and > 50% of this time was spent face to face counseling and coordinating the care of this patient's aortic aneurysm.    Gaye Pollack, MD [facility] [phone]

## 2018-12-28 ENCOUNTER — Ambulatory Visit (INDEPENDENT_AMBULATORY_CARE_PROVIDER_SITE_OTHER): Payer: Medicare Other | Admitting: *Deleted

## 2018-12-28 DIAGNOSIS — Z0000 Encounter for general adult medical examination without abnormal findings: Secondary | ICD-10-CM

## 2018-12-28 NOTE — Patient Instructions (Signed)
Preventive Care 75 Years and Older, Male Preventive care refers to lifestyle choices and visits with your health care provider that can promote health and wellness. This includes:  A yearly physical exam. This is also called an annual well check.  Regular dental and eye exams.  Immunizations.  Screening for certain conditions.  Healthy lifestyle choices, such as diet and exercise. What can I expect for my preventive care visit? Physical exam Your health care provider will check:  Height and weight. These may be used to calculate body mass index (BMI), which is a measurement that tells if you are at a healthy weight.  Heart rate and blood pressure.  Your skin for abnormal spots. Counseling Your health care provider may ask you questions about:  Alcohol, tobacco, and drug use.  Emotional well-being.  Home and relationship well-being.  Sexual activity.  Eating habits.  History of falls.  Memory and ability to understand (cognition).  Work and work Statistician. What immunizations do I need?  Influenza (flu) vaccine  This is recommended every year. Tetanus, diphtheria, and pertussis (Tdap) vaccine  You may need a Td booster every 10 years. Varicella (chickenpox) vaccine  You may need this vaccine if you have not already been vaccinated. Zoster (shingles) vaccine  You may need this after age 50. Pneumococcal conjugate (PCV13) vaccine  One dose is recommended after age 24. Pneumococcal polysaccharide (PPSV23) vaccine  One dose is recommended after age 33. Measles, mumps, and rubella (MMR) vaccine  You may need at least one dose of MMR if you were born in 1957 or later. You may also need a second dose. Meningococcal conjugate (MenACWY) vaccine  You may need this if you have certain conditions. Hepatitis A vaccine  You may need this if you have certain conditions or if you travel or work in places where you may be exposed to hepatitis A. Hepatitis B vaccine   You may need this if you have certain conditions or if you travel or work in places where you may be exposed to hepatitis B. Haemophilus influenzae type b (Hib) vaccine  You may need this if you have certain conditions. You may receive vaccines as individual doses or as more than one vaccine together in one shot (combination vaccines). Talk with your health care provider about the risks and benefits of combination vaccines. What tests do I need? Blood tests  Lipid and cholesterol levels. These may be checked every 5 years, or more frequently depending on your overall health.  Hepatitis C test.  Hepatitis B test. Screening  Lung cancer screening. You may have this screening every year starting at age 74 if you have a 30-pack-year history of smoking and currently smoke or have quit within the past 15 years.  Colorectal cancer screening. All adults should have this screening starting at age 57 and continuing until age 54. Your health care provider may recommend screening at age 47 if you are at increased risk. You will have tests every 1-10 years, depending on your results and the type of screening test.  Prostate cancer screening. Recommendations will vary depending on your family history and other risks.  Diabetes screening. This is done by checking your blood sugar (glucose) after you have not eaten for a while (fasting). You may have this done every 1-3 years.  Abdominal aortic aneurysm (AAA) screening. You may need this if you are a current or former smoker.  Sexually transmitted disease (STD) testing. Follow these instructions at home: Eating and drinking  Eat  a diet that includes fresh fruits and vegetables, whole grains, lean protein, and low-fat dairy products. Limit your intake of foods with high amounts of sugar, saturated fats, and salt.  Take vitamin and mineral supplements as recommended by your health care provider.  Do not drink alcohol if your health care provider  tells you not to drink.  If you drink alcohol: ? Limit how much you have to 0-2 drinks a day. ? Be aware of how much alcohol is in your drink. In the [place] one drink equals one 12 oz bottle of beer (355 mL), one 5 oz glass of wine (148 mL), or one 1 oz glass of hard liquor (44 mL). Lifestyle  Take daily care of your teeth and gums.  Stay active. Exercise for at least 30 minutes on 5 or more days each week.  Do not use any products that contain nicotine or tobacco, such as cigarettes, e-cigarettes, and chewing tobacco. If you need help quitting, ask your health care provider.  If you are sexually active, practice safe sex. Use a condom or other form of protection to prevent STIs (sexually transmitted infections).  Talk with your health care provider about taking a low-dose aspirin or statin. What's next?  Visit your health care provider once a year for a well check visit.  Ask your health care provider how often you should have your eyes and teeth checked.  Stay up to date on all vaccines. This information is not intended to replace advice given to you by your health care provider. Make sure you discuss any questions you have with your health care provider. Document Released: 03/13/2015 Document Revised: 02/08/2018 Document Reviewed: 02/08/2018 [employer] Patient Education  2020 [employer]  

## 2018-12-28 NOTE — Progress Notes (Signed)
MEDICARE ANNUAL WELLNESS VISIT  12/28/2018  Telephone Visit Disclaimer This Medicare AWV was conducted by telephone due to national recommendations for restrictions regarding the COVID-19 Pandemic (e.g. social distancing).  I verified, using two identifiers, that I am speaking with [name] or their authorized healthcare agent. I discussed the limitations, risks, security, and privacy concerns of performing an evaluation and management service by telephone and the potential availability of an in-person appointment in the future. The patient expressed understanding and agreed to proceed.   Subjective:  [name] is a 85 y.o. male [name], [name] who had a Medicare Annual Wellness Visit today via telephone. [name] is Retired and lives alone with his dog (registered emotional support dog) and he spends time with his lady friend. he has 2 children. he reports that he is socially active and does interact with friends/family regularly. he is moderately physically active and enjoys restoring old clocks and baking.  Patient Care Team: Dettinger, [name] as PCP - General (Family Medicine) Minus Breeding, [name] as PCP - Cardiology (Cardiology) Renato Shin, [name] as Consulting Physician (Endocrinology) Glenna Fellows, [name] as Attending Physician (Neurosurgery) Tanda Rockers, [name] as Consulting Physician (Pulmonary Disease) Gaye Pollack, [name] as Consulting Physician (Cardiothoracic Surgery)  Advanced Directives 12/28/2018 12/26/2017 08/16/2017 02/01/2017 06/28/2016 04/26/2016 03/25/2016  Does Patient Have a Medical Advance Directive? Yes Yes Yes Yes Yes Yes Yes  Type of Paramedic of [place];Living will [place];Living will [place];Living will Living will;Healthcare Power of [place];Living will [place];Living will [place];Living will  Does  patient want to make changes to medical advance directive? No - Patient declined No - Patient declined - - - Yes (Inpatient - patient requests chaplain consult to change a medical advance directive) -  Copy of [place] in Chart? No - copy requested Yes - - No - copy requested - Yes  Would patient like information on creating a medical advance directive? - - - - - - -  Pre-existing out of facility DNR order (yellow form or pink MOST form) - - - - - - -    Hospital Utilization Over the Past 12 Months: # of hospitalizations or ER visits: 0 # of surgeries: 0  Review of Systems    Patient reports that his overall health is unchanged compared to last year.  History obtained from chart review  Patient Reported Readings (BP, Pulse, CBG, Weight, etc) none  Pain Assessment Pain : No/denies pain     Current Medications & Allergies (verified) Allergies as of 12/28/2018      Reactions   Betadine [povidone Iodine] Other (See Comments)   Reaction:  Blisters    Gadavist [gadobutrol]    hives   Lortab [hydrocodone-acetaminophen] Nausea And Vomiting   Penicillins Other (See Comments)   Reaction:  Lightheadedness  Has patient had a PCN reaction causing immediate rash, facial/tongue/throat swelling, SOB or lightheadedness with hypotension: Yes Has patient had a PCN reaction causing severe rash involving mucus membranes or skin necrosis: No Has patient had a PCN reaction that required hospitalization No Has patient had a PCN reaction occurring within the last 10 years: No If all of the above answers are "NO", then may proceed with Cephalosporin use.   Zetia [ezetimibe] Other (See Comments)   Reaction:  Muscle weakness    Zocor [simvastatin] Nausea Only, Other (See Comments)  Reaction:  Muscle weakness       Medication List       Accurate as of December 28, 2018 11:10 AM. If you have any questions, ask your nurse or doctor.        albuterol 108 (90 Base) MCG/ACT  inhaler Commonly known as: VENTOLIN HFA Inhale 2 puffs into the lungs every 6 (six) hours as needed for wheezing or shortness of breath.   bisacodyl 10 MG suppository Commonly known as: DULCOLAX Place 10 mg rectally as needed for moderate constipation.   bromocriptine 2.5 MG tablet Commonly known as: PARLODEL TAKE  (1)  TABLET TWICE A DAY.   budesonide-formoterol 160-4.5 MCG/ACT inhaler Commonly known as: Symbicort Inhale 2 puffs into the lungs 2 (two) times daily.   busPIRone 15 MG tablet Commonly known as: BUSPAR TAKE 1 TABLET TWICE DAILY AS NEEDED   calcium carbonate 1500 (600 Ca) MG Tabs tablet Commonly known as: OSCAL Take 600 mg of elemental calcium by mouth daily with breakfast.   cholecalciferol 1000 units tablet Commonly known as: VITAMIN D Take 1,000 Units by mouth daily.   ferrous sulfate 325 (65 FE) MG tablet Take 325 mg by mouth daily with breakfast.   Flutter Devi Twice a day and prn as needed, may increase if feeling worse   GaviLAX 17 GM/SCOOP powder Generic drug: polyethylene glycol powder DISSOLVE 17 GM SCOOP IN 8 OZ. OF WATER AND DRINK ONCE DAILY AS NEEDED FOR CONSTIPATION   guaiFENesin 600 MG 12 hr tablet Commonly known as: MUCINEX Take 600 mg by mouth daily.   levalbuterol 0.63 MG/3ML nebulizer solution Commonly known as: Xopenex Take 3 mLs (0.63 mg total) by nebulization every 6 (six) hours as needed for wheezing or shortness of breath.   levothyroxine 75 MCG tablet Commonly known as: SYNTHROID Take 1 tablet (75 mcg total) by mouth daily.   magnesium oxide 400 (241.3 Mg) MG tablet Commonly known as: MAG-OX Take 400 mg by mouth every other day.   montelukast 10 MG tablet Commonly known as: SINGULAIR   naphazoline-pheniramine 0.025-0.3 % ophthalmic solution Commonly known as: NAPHCON-A Place 1 drop into both eyes 2 (two) times daily as needed for irritation or allergies.   nitroGLYCERIN 0.4 MG SL tablet Commonly known as: NITROSTAT  Place 1 tablet (0.4 mg total) under the tongue every 5 (five) minutes as needed for chest pain.   pantoprazole 40 MG tablet Commonly known as: PROTONIX TAKE (1) TABLET TWICE A DAY BEFORE MEALS.   silodosin 4 MG Caps capsule Commonly known as: RAPAFLO   sucralfate 1 g tablet Commonly known as: CARAFATE TAKE (1) TABLET FOUR TIMES DAILY AS NEEDED FOR GI UPSET   triamcinolone cream 0.1 % Commonly known as: KENALOG Apply 1 application topically 2 (two) times daily.   vitamin B-12 1000 MCG tablet Commonly known as: CYANOCOBALAMIN Take 1,000 mcg by mouth daily.   vitamin E 400 UNIT capsule Generic drug: vitamin E Take 400 Units by mouth daily.       History (reviewed): Past Medical History:  Diagnosis Date  . Abnormality of gait 06/07/2013  . Anemia   . Aneurysm ([place])    on assending aorta, currently watching it, Dr Cyndia Bent  . Anxiety   . Arthritis   . Asthma   . Atrial fibrillation ([place])   . Barrett's esophagus   . CAD (coronary artery disease) 03/2006   3.0 x 20 mm TAXUS Perseus DES to the LAD; 01/2007  L main 30%, oLAD 50%, pLAD stent ok, CFX  80%, OM 60%, pRCA 60%, mRCA 70%, oPDA 90%; med rx   . Cancer ([place])    skin cancer   . Cataract    bilateral removal of cateracts  . Complication of anesthesia     no issues,but pt prefers spinal due to Pulmonary problems  . COPD (chronic obstructive pulmonary disease) ([place])    oxygen  on standby in home.  . Depression   . Diverticulosis   . Emphysema   . Enlarged prostate with urinary retention   . Esophageal stenosis   . GERD (gastroesophageal reflux disease)   . Hiatal hernia   . Hypothyroidism   . Neuropathy   . Osteoporosis   . Pituitary macroadenoma ([place])   . Restless legs syndrome (RLS)   . UGIB (upper gastrointestinal bleed) 03/2013   EGD w/ large ulcer at GE junction   Past Surgical History:  Procedure Laterality Date  . ABDOMINAL HERNIA REPAIR   2008  . CARDIAC CATHETERIZATION  01/2007   L main 30%, oLAD  50%,  pLAD stent ok, CFX 80%, OM 60%, pRCA 60%, mRCA 70%, oPDA 90%; med rx  . cataract extraction both eyes    . COLONOSCOPY    . CORONARY STENT PLACEMENT  03/2006   3.0 x 20 mm TAXUS Perseus DES to the LAD  . CYSTOSCOPY N/A 06/10/2015   Procedure: CYSTOSCOPY FULGRATION OF BLEEDING,  electovapor resection;  Surgeon: Irine Seal, [name];  Location: [facility];  Service: Urology;  Laterality: N/A;  . CYSTOSCOPY WITH INSERTION OF UROLIFT N/A 06/01/2015   Procedure: CYSTOSCOPY WITH INSERTION OF UROLIFT x4;  Surgeon: Irine Seal, [name];  Location: [facility];  Service: Urology;  Laterality: N/A;  . ESOPHAGOGASTRODUODENOSCOPY N/A 04/20/2013   Procedure: ESOPHAGOGASTRODUODENOSCOPY (EGD);  Surgeon: Inda Castle, [name];  [facility];  Service: Endoscopy;  Laterality: N/A;  . ESOPHAGOGASTRODUODENOSCOPY N/A 03/25/2016   Procedure: ESOPHAGOGASTRODUODENOSCOPY (EGD);  Surgeon: Irene Shipper, [name];  Location: [facility];  Service: Endoscopy;  Laterality: N/A;  . ESOPHAGOGASTRODUODENOSCOPY (EGD) WITH PROPOFOL N/A 10/13/2015   Procedure: ESOPHAGOGASTRODUODENOSCOPY (EGD) WITH PROPOFOL;  Surgeon: Jerene Bears, [name];  [facility];  Service: Gastroenterology;  Laterality: N/A;  . FOOT SURGERY  1994 left, 2002 right foot   bilateral foot reconstruciton  . FOOT SURGERY     reconstruction of both feet- no retained hardware.  Marland Kitchen HIATAL HERNIA REPAIR  01-04-2008  . MELANOMA EXCISION  2019  . POLYPECTOMY    . PROSTATE SURGERY     x 2  . SAVORY DILATION N/A 03/25/2016   Procedure: SAVORY DILATION;  Surgeon: Irene Shipper, [name];  [facility];  Service: Endoscopy;  Laterality: N/A;  . TOTAL HIP ARTHROPLASTY  07/21/2011   Procedure: TOTAL HIP ARTHROPLASTY ANTERIOR APPROACH;  Surgeon: Mauri Pole, [name];  Location: [facility];  Service: Orthopedics;  Laterality: Left;  . TOTAL HIP ARTHROPLASTY Right 09/07/2012   Procedure: RIGHT TOTAL HIP ARTHROPLASTY ANTERIOR APPROACH;  Surgeon: Mcarthur Rossetti, [name];  Location: [facility];  Service: Orthopedics;  Laterality: Right;  . TRANSURETHRAL RESECTION OF BLADDER NECK N/A 11/04/2015   Procedure: RESECTION OF BLADDER NECK;  Surgeon: Cleon Gustin, [name];  Location: [facility];  Service: Urology;  Laterality: N/A;  . TRANSURETHRAL RESECTION OF PROSTATE N/A 11/04/2015   Procedure: TRANSURETHRAL RESECTION OF THE PROSTATE (TURP); REMOVAL OF UROLIFT IMPLANTS X THREE;  Surgeon: Cleon Gustin, [name];  Location: [facility];  Service: Urology;  Laterality: N/A;  . UPPER GASTROINTESTINAL ENDOSCOPY  12/2013   Dr  Pyrtle, gastritis  . VIDEO BRONCHOSCOPY Bilateral 02/01/2017   Procedure: VIDEO BRONCHOSCOPY WITHOUT FLUORO;  Surgeon: Tanda Rockers, [name];  [facility];  Service: Cardiopulmonary;  Laterality: Bilateral;   Family History  Problem Relation Age of Onset  . Heart disease Sister   . Heart failure Father   . Other Mother        phebitis related to Nathian's birth, he was 92 weeks old when she died  . Colon cancer Neg Hx   . Esophageal cancer Neg Hx   . Rectal cancer Neg Hx   . Stomach cancer Neg Hx   . Colon polyps Neg Hx    Social History   Socioeconomic History  . Marital status: Widowed    Spouse name: Not on file  . Number of children: 2  . Years of education: 47  . Highest education level: Associate degree: occupational, Hotel manager, or vocational program  Occupational History  . Occupation: retired  Scientific laboratory technician  . Financial resource strain: Not hard at all  . Food insecurity    Worry: Never true    Inability: Never true  . Transportation needs    Medical: No    Non-medical: No  Tobacco Use  . Smoking status: Never Smoker  . Smokeless tobacco: Never Used  Substance and Sexual Activity  . Alcohol use: No  . Drug use: No  . Sexual activity: Not Currently    Birth control/protection: None  Lifestyle  . Physical activity    Days per week: 5 days    Minutes per session: 20 min  . Stress: Not at all  Relationships  . Social connections    Talks on  phone: More than three times a week    Gets together: More than three times a week    Attends religious service: More than 4 times per year    Active member of club or organization: Yes    Attends meetings of clubs or organizations: More than 4 times per year    Relationship status: Widowed  Other Topics Concern  . Not on file  Social History Narrative   Patient drinks caffeine a few times a week.   Patient is right handed.    Activities of Daily Living In your present state of health, do you have any difficulty performing the following activities: 12/28/2018  Hearing? Y  Comment has hearing aids  Vision? N  Comment wears glasses-gets yearly eye exam  Difficulty concentrating or making decisions? Y  Comment forgetiing peoples names and gets confused more than what he used to  Walking or climbing stairs? N  Comment has a cane to use if he needs it but rarely has to use it  Dressing or bathing? N  Doing errands, shopping? N  Comment pt doesn't like to drive at night or in the rain-he has a "lady friend" that drives him when it is raining or at night  Preparing Food and eating ? N  Using the Toilet? N  In the past six months, have you accidently leaked urine? Y  Comment wears depends all the time  Do you have problems with loss of bowel control? N  Managing your Medications? N  Managing your Finances? N  Housekeeping or managing your Housekeeping? N  Some recent data might be hidden    Patient Education/ Literacy How often do you need to have someone help you when you read instructions, pamphlets, or other written materials from your doctor or pharmacy?: 1 - Never What  is the last grade level you completed in school?: Associates Degree  Exercise Current Exercise Habits: Home exercise routine, Type of exercise: walking;treadmill;stretching, Time (Minutes): 20, Frequency (Times/Week): 5, Weekly Exercise (Minutes/Week): 100, Intensity: Mild, Exercise limited by: cardiac  condition(s);respiratory conditions(s)  Diet Patient reports consuming 3 meals a day and 2 snack(s) a day Patient reports that his primary diet is: Regular Patient reports that she does have regular access to food.   Depression Screen PHQ 2/9 Scores 12/28/2018 12/10/2018 05/04/2018 04/18/2018 12/26/2017 12/15/2017 09/25/2017  PHQ - 2 Score 0 0 1 1 1 1  0  PHQ- 9 Score - - - - - - -     Fall Risk Fall Risk  12/28/2018 12/10/2018 05/04/2018 04/18/2018 12/26/2017  Falls in the past year? 0 0 0 0 No  Number falls in past yr: 0 - - - -  Injury with Fall? 0 - - - -  Risk Factor Category  - - - - -  Risk for fall due to : - - - - Other (Comment);Impaired balance/gait  Risk for fall due to: Comment - - - - He has basement steps that he uses several times a day. He does have handrails on each side. He also   Follow up - - - - -     Objective:  [name] seemed alert and oriented and he participated appropriately during our telephone visit.  Blood Pressure Weight BMI  BP Readings from Last 3 Encounters:  12/26/18 124/72  12/12/18 110/62  12/10/18 134/84   Wt Readings from Last 3 Encounters:  12/26/18 155 lb (70.3 kg)  12/12/18 152 lb (68.9 kg)  12/10/18 151 lb 12.8 oz (68.9 kg)   BMI Readings from Last 1 Encounters:  12/26/18 27.46 kg/m    *Unable to obtain current vital signs, weight, and BMI due to telephone visit type  Hearing/Vision  . Leul did not seem to have difficulty with hearing/understanding during the telephone conversation . Reports that he has had a formal eye exam by an eye care professional within the past year . Reports that he has not had a formal hearing evaluation within the past year *Unable to fully assess hearing and vision during telephone visit type  Cognitive Function: 6CIT Screen 12/28/2018 12/26/2017  What Year? 0 points 0 points  What month? 0 points 0 points  What time? 0 points 0 points  Count back from 20 0 points 0 points  Months in reverse 4  points 0 points  Repeat phrase 0 points 0 points  Total Score 4 0   (Normal:0-7, Significant for Dysfunction: >8)  Normal Cognitive Function Screening: Yes   Immunization & Health Maintenance Record Immunization History  Administered Date(s) Administered  . Fluad Quad(high Dose 65+) 12/10/2018  . Influenza Whole 11/22/2007, 10/29/2009, 10/30/2010, 11/29/2011  . Influenza, High Dose Seasonal PF 12/03/2015, 12/26/2016, 12/27/2017  . Influenza,inj,Quad PF,6+ Mos 12/03/2012, 11/25/2013, 12/19/2014  . Pneumococcal Conjugate-13 01/09/2013  . Pneumococcal Polysaccharide-23 10/29/2009  . Td 04/29/1998  . Zoster 03/31/2006    Health Maintenance  Topic Date Due  . TETANUS/TDAP  06/28/2020  . INFLUENZA VACCINE  Completed  . PNA vac Low Risk Adult  Completed       Assessment  This is a routine wellness examination for [name].  Health Maintenance: Due or Overdue There are no preventive care reminders to display for this patient.  [name] does not need a referral for Community Assistance: Care Management:   no Social Work:  no Prescription Assistance:  no Nutrition/Diabetes Education:  no   Plan:  Personalized Goals Goals Addressed            This Visit's Progress   . DIET - INCREASE WATER INTAKE       Try to drink 6-8 glasses of water daily      Personalized Health Maintenance & Screening Recommendations  Td vaccine Advanced directives: has an advanced directive - a copy HAS NOT been provided.  Lung Cancer Screening Recommended: no (Low Dose CT Chest recommended if Age 57-80 years, 30 pack-year currently smoking OR have quit w/in past 15 years) Hepatitis C Screening recommended: no HIV Screening recommended: no  Advanced Directives: Written information was not prepared per patient's request.  Referrals & Orders No orders of the defined types were placed in this encounter.   Follow-up Plan . Follow-up with Dettinger, [name] as planned  . Consider TDAP vaccine at your next visit with your PCP . Bring a copy of your Advanced Directives in for our records   I have personally reviewed and noted the following in the patient's chart:   . Medical and social history . Use of alcohol, tobacco or illicit drugs  . Current medications and supplements . Functional ability and status . Nutritional status . Physical activity . Advanced directives . List of other physicians . Hospitalizations, surgeries, and ER visits in previous 12 months . Vitals . Screenings to include cognitive, depression, and falls . Referrals and appointments  In addition, I have reviewed and discussed with [name] certain preventive protocols, quality metrics, and best practice recommendations. A written personalized care plan for preventive services as well as general preventive health recommendations is available and can be mailed to the patient at his request.      Milas Hock, LPN  [phone]

## 2019-01-04 ENCOUNTER — Telehealth: Payer: Self-pay | Admitting: Nurse Practitioner

## 2019-01-04 NOTE — Telephone Encounter (Signed)
He has been through cardiac evaluation. What are the plans for EGD? Thanks

## 2019-01-04 NOTE — Telephone Encounter (Signed)
Pt called requesting if Dr. Hilarie Fredrickson could review his records and advise if pt needs another egd. Pt states that he feels fine as long as he takes protonix but if he does not, he gets very bad acid reflux.

## 2019-01-08 NOTE — Telephone Encounter (Signed)
Beth, I will pass this along to Dr. Hilarie Fredrickson since he know patient best. However, I would say that he doesn't need one if GERD symptoms controlled on PPI and not having dysphagia.

## 2019-01-08 NOTE — Telephone Encounter (Signed)
If reflux symptoms are controlled on once daily pantoprazole and he is not having dysphagia then I do not think repeat upper endoscopy is warranted or needed at this time I hope he is doing well all around

## 2019-01-08 NOTE — Telephone Encounter (Signed)
Patient is advised of this. He will call us if he has any changes or concerns.

## 2019-01-14 ENCOUNTER — Other Ambulatory Visit: Payer: Self-pay

## 2019-01-15 ENCOUNTER — Ambulatory Visit (INDEPENDENT_AMBULATORY_CARE_PROVIDER_SITE_OTHER): Payer: Medicare Other

## 2019-01-15 DIAGNOSIS — Z23 Encounter for immunization: Secondary | ICD-10-CM

## 2019-01-22 ENCOUNTER — Other Ambulatory Visit: Payer: Self-pay

## 2019-01-23 ENCOUNTER — Other Ambulatory Visit: Payer: Self-pay | Admitting: Internal Medicine

## 2019-01-23 ENCOUNTER — Other Ambulatory Visit: Payer: Self-pay | Admitting: *Deleted

## 2019-01-23 ENCOUNTER — Encounter: Payer: Self-pay | Admitting: Family Medicine

## 2019-01-23 ENCOUNTER — Ambulatory Visit (INDEPENDENT_AMBULATORY_CARE_PROVIDER_SITE_OTHER): Payer: Medicare Other | Admitting: Family Medicine

## 2019-01-23 VITALS — BP 114/69 | HR 78 | Temp 97.1°F | Ht 63.0 in | Wt 153.6 lb

## 2019-01-23 DIAGNOSIS — E034 Atrophy of thyroid (acquired): Secondary | ICD-10-CM | POA: Diagnosis not present

## 2019-01-23 DIAGNOSIS — K2100 Gastro-esophageal reflux disease with esophagitis, without bleeding: Secondary | ICD-10-CM

## 2019-01-23 DIAGNOSIS — E785 Hyperlipidemia, unspecified: Secondary | ICD-10-CM

## 2019-01-23 MED ORDER — LEVOTHYROXINE SODIUM 75 MCG PO TABS: 75.0000 ug | ORAL_TABLET | Freq: Every day | ORAL | 3 refills | Status: DC

## 2019-01-23 MED ORDER — FERROUS SULFATE 325 (65 FE) MG PO TABS: 325.0000 mg | ORAL_TABLET | Freq: Every day | ORAL | 3 refills | Status: DC

## 2019-01-23 MED ORDER — BUSPIRONE HCL 15 MG PO TABS: 15.0000 mg | ORAL_TABLET | Freq: Two times a day (BID) | ORAL | 0 refills | Status: DC | PRN

## 2019-01-23 MED ORDER — PANTOPRAZOLE SODIUM 40 MG PO TBEC: DELAYED_RELEASE_TABLET | ORAL | 3 refills | Status: DC

## 2019-01-23 NOTE — Progress Notes (Signed)
BP 114/69   Pulse 78   Temp (!) 97.1 F (36.2 C) (Temporal)   Ht '5\' 3"'$  (1.6 m)   Wt 153 lb 9.6 oz (69.7 kg)   SpO2 100%   BMI 27.21 kg/m    Subjective:   Patient ID: [name], male    DOB: Jun 16, 1932, 85 y.o.   MRN: [id number]  HPI: [name] is a 85 y.o. male presenting on 01/23/2019 for Hypothyroidism (3 month follow up) and Hypertension   HPI Hypothyroidism recheck Patient is coming in for thyroid recheck today as well. They deny any issues with hair changes or heat or cold problems or diarrhea or constipation. They deny any chest pain or palpitations. They are currently on levothyroxine 75 micrograms   Hyperlipidemia Patient is coming in for recheck of his hyperlipidemia. The patient is currently taking no medication currently, we are monitoring for now but patient does have atherosclerosis in his history, he has been intolerant to at least a few statins and Zetia. They deny any issues with myalgias or history of liver damage from it. They deny any focal numbness or weakness or chest pain.   GERD Patient is currently on pantoprazole and sucralfate.  She denies any major symptoms or abdominal pain or belching or burping. She denies any blood in her stool or lightheadedness or dizziness.   Relevant past medical, surgical, family and social history reviewed and updated as indicated. Interim medical history since our last visit reviewed. Allergies and medications reviewed and updated.  Review of Systems  Constitutional: Negative for chills and fever.  Eyes: Negative for visual disturbance.  Respiratory: Negative for shortness of breath and wheezing.   Cardiovascular: Negative for chest pain and leg swelling.  Musculoskeletal: Negative for back pain and gait problem.  Skin: Negative for rash.  Neurological: Negative for dizziness, weakness and light-headedness.  All other systems reviewed and are negative.   Per HPI unless specifically indicated above    Allergies as of 01/23/2019      Reactions   Betadine [povidone Iodine] Other (See Comments)   Reaction:  Blisters    Gadavist [gadobutrol]    hives   Lortab [hydrocodone-acetaminophen] Nausea And Vomiting   Penicillins Other (See Comments)   Reaction:  Lightheadedness  Has patient had a PCN reaction causing immediate rash, facial/tongue/throat swelling, SOB or lightheadedness with hypotension: Yes Has patient had a PCN reaction causing severe rash involving mucus membranes or skin necrosis: No Has patient had a PCN reaction that required hospitalization No Has patient had a PCN reaction occurring within the last 10 years: No If all of the above answers are "NO", then may proceed with Cephalosporin use.   Zetia [ezetimibe] Other (See Comments)   Reaction:  Muscle weakness    Zocor [simvastatin] Nausea Only, Other (See Comments)   Reaction:  Muscle weakness       Medication List       Accurate as of January 23, 2019 11:22 AM. If you have any questions, ask your nurse or doctor.        albuterol 108 (90 Base) MCG/ACT inhaler Commonly known as: VENTOLIN HFA Inhale 2 puffs into the lungs every 6 (six) hours as needed for wheezing or shortness of breath.   bisacodyl 10 MG suppository Commonly known as: DULCOLAX Place 10 mg rectally as needed for moderate constipation.   bromocriptine 2.5 MG tablet Commonly known as: PARLODEL TAKE  (1)  TABLET TWICE A DAY.   budesonide-formoterol 160-4.5 MCG/ACT inhaler Commonly  known as: Symbicort Inhale 2 puffs into the lungs 2 (two) times daily.   busPIRone 15 MG tablet Commonly known as: BUSPAR TAKE 1 TABLET TWICE DAILY AS NEEDED   calcium carbonate 1500 (600 Ca) MG Tabs tablet Commonly known as: OSCAL Take 600 mg of elemental calcium by mouth daily with breakfast.   cholecalciferol 1000 units tablet Commonly known as: VITAMIN D Take 1,000 Units by mouth daily.   ferrous sulfate 325 (65 FE) MG tablet Take 1 tablet (325 mg total)  by mouth daily with breakfast.   Flutter Devi Twice a day and prn as needed, may increase if feeling worse   GaviLAX 17 GM/SCOOP powder Generic drug: polyethylene glycol powder DISSOLVE 17 GM SCOOP IN 8 OZ. OF WATER AND DRINK ONCE DAILY AS NEEDED FOR CONSTIPATION   guaiFENesin 600 MG 12 hr tablet Commonly known as: MUCINEX Take 600 mg by mouth daily.   levalbuterol 0.63 MG/3ML nebulizer solution Commonly known as: Xopenex Take 3 mLs (0.63 mg total) by nebulization every 6 (six) hours as needed for wheezing or shortness of breath.   levothyroxine 75 MCG tablet Commonly known as: SYNTHROID Take 1 tablet (75 mcg total) by mouth daily.   magnesium oxide 400 (241.3 Mg) MG tablet Commonly known as: MAG-OX Take 400 mg by mouth every other day.   montelukast 10 MG tablet Commonly known as: SINGULAIR   naphazoline-pheniramine 0.025-0.3 % ophthalmic solution Commonly known as: NAPHCON-A Place 1 drop into both eyes 2 (two) times daily as needed for irritation or allergies.   nitroGLYCERIN 0.4 MG SL tablet Commonly known as: NITROSTAT Place 1 tablet (0.4 mg total) under the tongue every 5 (five) minutes as needed for chest pain.   pantoprazole 40 MG tablet Commonly known as: PROTONIX 1 tablet daily What changed: See the new instructions. Changed by: Worthy Rancher, MD   silodosin 4 MG Caps capsule Commonly known as: RAPAFLO   sucralfate 1 g tablet Commonly known as: CARAFATE TAKE (1) TABLET FOUR TIMES DAILY AS NEEDED FOR GI UPSET   triamcinolone cream 0.1 % Commonly known as: KENALOG Apply 1 application topically 2 (two) times daily.   vitamin B-12 1000 MCG tablet Commonly known as: CYANOCOBALAMIN Take 1,000 mcg by mouth daily.   vitamin E 400 UNIT capsule Generic drug: vitamin E Take 400 Units by mouth daily.        Objective:   BP 114/69   Pulse 78   Temp (!) 97.1 F (36.2 C) (Temporal)   Ht '5\' 3"'$  (1.6 m)   Wt 153 lb 9.6 oz (69.7 kg)   SpO2 100%    BMI 27.21 kg/m   Wt Readings from Last 3 Encounters:  01/23/19 153 lb 9.6 oz (69.7 kg)  12/26/18 155 lb (70.3 kg)  12/12/18 152 lb (68.9 kg)    Physical Exam Vitals signs and nursing note reviewed.  Constitutional:      General: He is not in acute distress.    Appearance: He is well-developed. He is not diaphoretic.  Eyes:     General: No scleral icterus.    Conjunctiva/sclera: Conjunctivae normal.  Neck:     Musculoskeletal: Neck supple.     Thyroid: No thyromegaly.  Cardiovascular:     Rate and Rhythm: Normal rate and regular rhythm.     Heart sounds: Normal heart sounds. No murmur.  Pulmonary:     Effort: Pulmonary effort is normal. No respiratory distress.     Breath sounds: Normal breath sounds. No wheezing.  Musculoskeletal:  Normal range of motion.  Lymphadenopathy:     Cervical: No cervical adenopathy.  Skin:    General: Skin is warm and dry.     Findings: No rash.  Neurological:     Mental Status: He is alert and oriented to person, place, and time.     Coordination: Coordination normal.  Psychiatric:        Behavior: Behavior normal.       Assessment & Plan:   Problem List Items Addressed This Visit      Digestive   Gastroesophageal reflux disease with esophagitis   Relevant Orders   CBC with Differential/Platelet   CMP14+EGFR     Endocrine   Hypothyroidism - Primary   Relevant Medications   levothyroxine (SYNTHROID) 75 MCG tablet   Other Relevant Orders   CMP14+EGFR   TSH     Other   Hyperlipidemia LDL goal <70      Says he is feeling good, will recheck his TSH, it was slightly elevated last time.  Continue other medication, no major changes. Follow up plan: Return in about 4 months (around 05/23/2019), or if symptoms worsen or fail to improve, for Recheck thyroid and cholesterol in GERD.  Counseling provided for all of the vaccine components Orders Placed This Encounter  Procedures  . CBC with Differential/Platelet  . CMP14+EGFR  .  TSH    Caryl Pina, MD [facility] 01/23/2019, 11:22 AM

## 2019-01-24 LAB — CBC WITH DIFFERENTIAL/PLATELET
Basophils Absolute: 0.1 10*3/uL (ref 0.0–0.2)
Basos: 1 %
EOS (ABSOLUTE): 0.3 10*3/uL (ref 0.0–0.4)
Eos: 3 %
Hematocrit: 37.6 % (ref 37.5–51.0)
Hemoglobin: 12.2 g/dL — ABNORMAL LOW (ref 13.0–17.7)
Immature Grans (Abs): 0.3 10*3/uL — ABNORMAL HIGH (ref 0.0–0.1)
Immature Granulocytes: 4 %
Lymphocytes Absolute: 1.2 10*3/uL (ref 0.7–3.1)
Lymphs: 15 %
MCH: 31.9 pg (ref 26.6–33.0)
MCHC: 32.4 g/dL (ref 31.5–35.7)
MCV: 98 fL — ABNORMAL HIGH (ref 79–97)
Monocytes Absolute: 1.1 10*3/uL — ABNORMAL HIGH (ref 0.1–0.9)
Monocytes: 14 %
Neutrophils Absolute: 4.9 10*3/uL (ref 1.4–7.0)
Neutrophils: 63 %
Platelets: 226 10*3/uL (ref 150–450)
RBC: 3.83 x10E6/uL — ABNORMAL LOW (ref 4.14–5.80)
RDW: 14.1 % (ref 11.6–15.4)
WBC: 7.8 10*3/uL (ref 3.4–10.8)

## 2019-01-24 LAB — CMP14+EGFR
ALT: 13 IU/L (ref 0–44)
AST: 22 IU/L (ref 0–40)
Albumin/Globulin Ratio: 1.3 (ref 1.2–2.2)
Albumin: 3.6 g/dL (ref 3.6–4.6)
Alkaline Phosphatase: 84 IU/L (ref 39–117)
BUN/Creatinine Ratio: 18 (ref 10–24)
BUN: 16 mg/dL (ref 8–27)
Bilirubin Total: 0.4 mg/dL (ref 0.0–1.2)
CO2: 22 mmol/L (ref 20–29)
Calcium: 9 mg/dL (ref 8.6–10.2)
Chloride: 105 mmol/L (ref 96–106)
Creatinine, Ser: 0.88 mg/dL (ref 0.76–1.27)
GFR calc Af Amer: 91 mL/min/{1.73_m2} (ref >59)
GFR calc non Af Amer: 79 mL/min/{1.73_m2} (ref >59)
Globulin, Total: 2.8 g/dL (ref 1.5–4.5)
Glucose: 82 mg/dL (ref 65–99)
Potassium: 4.3 mmol/L (ref 3.5–5.2)
Sodium: 141 mmol/L (ref 134–144)
Total Protein: 6.4 g/dL (ref 6.0–8.5)

## 2019-01-24 LAB — TSH: TSH: 0.492 u[IU]/mL (ref 0.450–4.500)

## 2019-03-04 NOTE — Telephone Encounter (Signed)
Ok to set up televisit to resolve but he needs to understand there will be millions who get it before he is offered it and needs to make a fully informed decision which I can't resolve in an email

## 2019-03-04 NOTE — Telephone Encounter (Signed)
Please advise on pt email:  Corvid-19 Vaccine: As your file will reflect I have a lung condition that necessitate using the flutter valve several times per day. - "I would like you opinion as to taking the vaccine shot." Presently I am hesitate to be among the earlier user, or possibly not taking it at all!! Kind regards. [name]  dob 1932/07/28. [phone].

## 2019-03-06 ENCOUNTER — Ambulatory Visit (INDEPENDENT_AMBULATORY_CARE_PROVIDER_SITE_OTHER): Payer: Medicare Other | Admitting: Internal Medicine

## 2019-03-06 ENCOUNTER — Encounter: Payer: Self-pay | Admitting: Internal Medicine

## 2019-03-06 ENCOUNTER — Other Ambulatory Visit: Payer: Self-pay

## 2019-03-06 DIAGNOSIS — J479 Bronchiectasis, uncomplicated: Secondary | ICD-10-CM | POA: Diagnosis not present

## 2019-03-06 DIAGNOSIS — R0609 Other forms of dyspnea: Secondary | ICD-10-CM

## 2019-03-06 DIAGNOSIS — J449 Chronic obstructive pulmonary disease, unspecified: Secondary | ICD-10-CM

## 2019-03-06 NOTE — Assessment & Plan Note (Addendum)
Quit smoking completely 1980 mostly pipes     - Spirometry 03/30/2010 FEV1  2.86 (116%) ratio 66  - FOB 02/01/2017  Copious retained secretions - Flutter valve re-training 04/28/2017  - PFT's  07/31/2017  FEV1 2.44 (102 % ) ratio 64  p 12 % improvement from saba p symbicort 160  prior to study with DLCO unable to perform  - 07/31/2017  After extensive coaching inhaler device  effectiveness =    75% from a baseline of 50% - 07/31/2017 d/c singulair and add levquin 500 mg daily x 7 day courses prn purulent sputum 11/06/2017- acute exacerbation with purluent sputum and low grade temp.Tx with levaquin - 11/13/2017    added back symb 80 2bid > increased to 160 2bid - 08/02/2018   Cipro to 500 mg twice daily x 10 day  Mostly AB/bronchiectasis pattern doing well on symbicort 160 2bid and prn cipro as above  Pt informed of the seriousness of COVID 19 infection as a direct risk to lung health  and safey and to close contacts and should continue to wear a facemask in public and minimize exposure to public locations but especially avoid any area or activity where non-close contacts are not observing distancing or wearing an appropriate face mask.  I strongly recommended vaccine when offered.    >>>>  F/u in 6 m to avoid exposure     Each maintenance medication was reviewed in detail including most importantly the difference between maintenance and as needed and under what circumstances the prns are to be used.  Please see AVS for specific  Instructions which are unique to this visit and I personally typed out  which were reviewed in detail over the phone with the patient and a copy provided via MyChart

## 2019-03-06 NOTE — Progress Notes (Signed)
Subjective:   Patient ID: [name], male    DOB: 05-30-1932   MRN: [id number]    Brief patient profile:  85   yowm quit smoking completely around [place] (relatively light) with cough and short of breath eval by  Dr Eliberto Ivory  Then [name] dx of copd/ cb  pft's showed fev1 116% 02/2010 though ratio 66 c/w GOLD I criteria with confirmed bronchiectasis in 11/2017      History of Present Illness  07/05/2010 ov cc recurrent pna's since June 2011 cc not back to baseline in terms of activities he enjoyed in May, for example  Could do some yardwork  and rarely  Needed saba and no need for any maint rx,   but on  spriva for sob and still frequent tightness generalized front more than back,  Equal both sides  low grade fever and mucus gets thick and yellow> admit to [facility] April 24-27 by [facility] dx of ? Pna.   No sob at rest.   Continue protonix 40 mg  Take 30-60 min before first meal of the day and Pepcid 20 mg at bedtime GERD (REFLUX) diet Try symbicort 160 Take 2 puffs bid and work on hfa technique     01/31/2017  f/u ov/Halsey Persaud re:  Copd 1/ chronic bronchitis / ? Prev aspiraton with abnormal cT chest  ? obst RLL  Chief Complaint  Patient presents with  . Follow-up    Pt states he has had "a touch of PNA". He has been coughing since the beginning on Nov 2018.  He has taken doxy and levaquin.  He is coughing up greyish white sputum.  He has an albuterol inhaler that he rarely uses.   swallowing better now but still very congested cough esp since early nov 2018  Not using flutter valve as rec  hfa has improved but not yet adequate Still some hb/ sore throat symptoms despite reporting ppi bid  Also still some noct cough sev hours p lie down despite using 6 inch blocks rec FOB 02/01/17> tenacious secretions> nl flora/ no tb, no fungus on culture    04/28/2017  f/u ov/Ladarien Beeks re: f/u COPD / cb abn ct again Chief Complaint  Patient presents with  . Acute Visit    Increased cough x 10 days- prod with white to  grey sputum. He started on Doxy per Dr Laurance Flatten- has one dose remaining. He states his breathing is unchanged since his last visit. He rarely uses his albuterol inhaler.   Had CT Chest done 04/26/17.    did better p first few days p fob until the end Dec nasty mucus, more sob > doxy then added zpak then better again then worse again last week if Feb 2019 restarted doxy and now on day #  9/10 on  ct chest 04/26/17 obst again on w/u for TAA Doe = MMRC1 = can walk nl pace, flat grade, can't hurry or go uphills or steps s sob   Sleeps ok but quit a bit of am congestion /mucus still grey   rec Please see patient coordinator before you leave today  to schedule sinus CT> neg  For nasty mucus >  zpak  Prednisone 10 mg take  4 each am x 2 days,   2 each am x 2 days,  1 each am x 2 days and stop  Work on inhaler technique:       12/25/2017 acute ov/Kayti Poss re: bronchiectasis flare, last levaquin was 12/20/17  / maint on  smyb 160 Chief Complaint  Patient presents with  . Acute Visit    started running fever 12/24/17. He also has had chest congestion and cough with dark brown sputum. He rarely uses his albuterol inhaler or neb.   Dyspnea:  MMRC3 = can't walk 100 yards even at a slow pace at a flat grade s stopping due to sob   Cough: worse one hour p hs with lots of yellow mucus esp in am / assoc overt HB Sleeping: 6 inch hob  SABA use: only once a day rec Sputum culture: nl flora/ neg afb smear  Mucinex is 1200 mg every 12  hours with flutter valve as much as possible  HRCT 11/3017 c/w bronchiectasis  VEST rec   02/05/2018  f/u ov/Karandeep Resende re: copd 1, bronchiectasis maint on symb 160 2bid/flutter/vest  Chief Complaint  Patient presents with  . Follow-up   Dyspnea:  No change = mmrc3 Cough: better with approp use of flutter, no better with vest  Sleeping: 6 in blocks, some worse cough / congestion in am but not noct  SABA use: min rec Order was sent to DME company to discontinue your VEST        03/28/2018  f/u ov/Lua Feng re: copd 1 with bronchiectasis/ maint symb 160 2bid Chief Complaint  Patient presents with  . Follow-up    Pt has productive cough-white, chest tightness, SOB with exertion.    Dyspnea:  MMRC2 = can't walk a nl pace on a flat grade s sob but does fine slow and flat ok at [employer] Cough variably purulent up to a half a cup in 24 h, fltter helps  Sleeping: 6 in bed blocks/ pillows x one  SABA use: varies  02: not using  Rec If mucus turns nasty/bloody > cipro 500 mg twice daily x 7 days (refillable) Please remember to go to the lab department   for your tests - we will call you with the results when they are available.    08/02/2018  f/u ov/Hiilani Jetter re: bronchiectasis / GOLD I copd  Chief Complaint  Patient presents with  . Follow-up    Cough with grey/green sputum, sometimes wakes in the night coughing. Last round of cipro was in Jan 2020. He is using his inhaler 2 x per wk on average.   Dyspnea:  Able to walk a quarter mile with dog some hills Cough: worse 2-3 am / uses flutter and back to bed also during the day  Sleeping: bed is flat/ right side down/ wedge SABA use: as above  02: none  rec Change cipro to 500 mg twice daily x 10 day cycles as needed for nasty mucus   Virtual Visit via Telephone Note 03/06/2019  I connected with [name] on 03/06/19 at  9:05  AM EST by telephone and verified that I am speaking with the correct person using two identifiers.   I discussed the limitations, risks, security and privacy concerns of performing an evaluation and management service by telephone and the availability of in person appointments. I also discussed with the patient that there may be a patient responsible charge related to this service. The patient expressed understanding and agreed to proceed.   History of Present Illness: copd  GOLD I bronchiectasis Dyspnea:  Out walking dog  - mb and back = 0.25 miles /slt slope does fine  Cough: last cipro  June 2020  Sleeping: wakes up most nights with cough and uses flutter then back to bed  SABA use: none while on sybmb 160 2bid  02: none    No obvious day to day or daytime variability or assoc excess/ purulent sputum or mucus plugs or hemoptysis or cp or chest tightness, subjective wheeze or overt sinus or hb symptoms.    Also denies any obvious fluctuation of symptoms with weather or environmental changes or other aggravating or alleviating factors except as outlined above.   Meds reviewed/ med reconciliation completed          Observations/Objective: slt hoarse, no conversational sob/ slt rattling on voluntary maneuver   Assessment and Plan: See problem list for active a/p's   Follow Up Instructions: See avs for instructions unique to this ov which includes revised/ updated med list     I discussed the assessment and treatment plan with the patient. The patient was provided an opportunity to ask questions and all were answered. The patient agreed with the plan and demonstrated an understanding of the instructions.   The patient was advised to call back or seek an in-person evaluation if the symptoms worsen or if the condition fails to improve as anticipated.  I provided 25 minutes of non-face-to-face time during this encounter.   [name], MD            Past Medical History:  HYPERLIPIDEMIA (ICD-272.4)  CORONARY ARTERY DISEASE (ICD-414.00)  BENIGN PROSTATIC HYPERTROPHY, HX OF (ICD-V13.8)  DIVERTICULOSIS, MILD (ICD-562.10)  OSTEOPOROSIS (ICD-733.00)      - Took alendronate x 3 years, stopped due to GI effects DEGENERATIVE JOINT DISEASE (ICD-715.90)  ESOPHAGITIS (ICD-530.10)  ANEMIA (ICD-285.9)  COLONIC POLYPS (ICD-211.3)  HYPERPROLACTINEMIA (ICD-253.1)  HYPOTHYROIDISM (ICD-244.9)  PITUITARY ADENOMA (ICD-227.3)  COPD (ICD-496)     - 08/24/2010 95% HFA    - Bronchiectasis confirmed 12/27/17 by HRCT     Past Surgical History:  Inguinal herniorrhaphy   PTCA/stent  LEFT & RIGHT FOOT RECONSTRUCTION  hiatal hernia repair 2009   Family History:  neg for pituitary dz  mother died age 21 from phlebitis  father died age 58 from heart failure  8 siblings  alive age 71,80,89,92  1 died age 68 heart failure  3 died age 67,76,90 from heart problems    Social History:  married  2 children  retiired  never smoked  no etoh

## 2019-03-11 ENCOUNTER — Ambulatory Visit (INDEPENDENT_AMBULATORY_CARE_PROVIDER_SITE_OTHER): Payer: Medicare Other | Admitting: Family Medicine

## 2019-03-11 ENCOUNTER — Other Ambulatory Visit: Payer: Self-pay

## 2019-03-11 ENCOUNTER — Encounter: Payer: Self-pay | Admitting: Family Medicine

## 2019-03-11 DIAGNOSIS — H1032 Unspecified acute conjunctivitis, left eye: Secondary | ICD-10-CM | POA: Diagnosis not present

## 2019-03-11 MED ORDER — TOBRAMYCIN-DEXAMETHASONE 0.3-0.1 % OP SUSP: OPHTHALMIC | 0 refills | Status: DC

## 2019-03-11 NOTE — Progress Notes (Signed)
    Subjective:    Patient ID: [name], male    DOB: [birth year], 86 y.o.   MRN: [id number]   HPI: [name] is a 86 y.o. male presenting for redness and swelling at the left eye. Onset 1 week ago. No change in vision. Not painful until today. Now if feels like he has an eye lash in it.     Relevant past medical, surgical, family and social history reviewed and updated as indicated.  Interim medical history since our last visit reviewed. Allergies and medications reviewed and updated.  ROS:  Review of Systems  Constitutional: Negative for fever.     Social History   Tobacco Use  Smoking Status Never Smoker  Smokeless Tobacco Never Used       Objective:     Wt Readings from Last 3 Encounters:  01/23/19 153 lb 9.6 oz (69.7 kg)  12/26/18 155 lb (70.3 kg)  12/12/18 152 lb (68.9 kg)     Exam deferred. Pt. Harboring due to COVID 19. Phone visit performed.   Assessment & Plan:   1. Acute bacterial conjunctivitis of left eye     Meds ordered this encounter  Medications  . tobramycin-dexamethasone (TOBRADEX) ophthalmic solution    Sig: Apply 1 drop in affected eye(s) every 2 hours for two days. Then every 4 hours for 5 days.    Dispense:  5 mL    Refill:  0    No orders of the defined types were placed in this encounter.     Diagnoses and all orders for this visit:  Acute bacterial conjunctivitis of left eye  Other orders -     tobramycin-dexamethasone (TOBRADEX) ophthalmic solution; Apply 1 drop in affected eye(s) every 2 hours for two days. Then every 4 hours for 5 days.    Virtual Visit via telephone Note  I discussed the limitations, risks, security and privacy concerns of performing an evaluation and management service by telephone and the availability of in person appointments. The patient was identified with two identifiers. Pt.expressed understanding and agreed to proceed. Pt. Is at home. Dr. Livia Snellen is in his office.  Follow Up  Instructions:   I discussed the assessment and treatment plan with the patient. The patient was provided an opportunity to ask questions and all were answered. The patient agreed with the plan and demonstrated an understanding of the instructions.   The patient was advised to call back or seek an in-person evaluation if the symptoms worsen or if the condition fails to improve as anticipated.   Total minutes including chart review and phone contact time: 8   Follow up plan: No follow-ups on file.  Claretta Fraise, MD [facility]

## 2019-03-22 ENCOUNTER — Encounter: Payer: Self-pay | Admitting: Family Medicine

## 2019-04-24 ENCOUNTER — Other Ambulatory Visit: Payer: Self-pay | Admitting: Internal Medicine

## 2019-04-24 ENCOUNTER — Other Ambulatory Visit: Payer: Self-pay

## 2019-04-25 ENCOUNTER — Encounter: Payer: Self-pay | Admitting: Family Medicine

## 2019-04-25 ENCOUNTER — Ambulatory Visit: Payer: Medicare Other | Admitting: Family Medicine

## 2019-05-02 ENCOUNTER — Other Ambulatory Visit: Payer: Self-pay

## 2019-05-03 ENCOUNTER — Ambulatory Visit (INDEPENDENT_AMBULATORY_CARE_PROVIDER_SITE_OTHER): Payer: Medicare Other | Admitting: Family Medicine

## 2019-05-03 ENCOUNTER — Encounter: Payer: Self-pay | Admitting: Family Medicine

## 2019-05-03 ENCOUNTER — Other Ambulatory Visit: Payer: Self-pay

## 2019-05-03 VITALS — BP 117/67 | HR 65 | Ht 66.0 in | Wt 156.0 lb

## 2019-05-03 DIAGNOSIS — E034 Atrophy of thyroid (acquired): Secondary | ICD-10-CM | POA: Diagnosis not present

## 2019-05-03 DIAGNOSIS — N400 Enlarged prostate without lower urinary tract symptoms: Secondary | ICD-10-CM | POA: Diagnosis not present

## 2019-05-03 DIAGNOSIS — E785 Hyperlipidemia, unspecified: Secondary | ICD-10-CM | POA: Diagnosis not present

## 2019-05-03 DIAGNOSIS — R2689 Other abnormalities of gait and mobility: Secondary | ICD-10-CM

## 2019-05-03 DIAGNOSIS — R413 Other amnesia: Secondary | ICD-10-CM | POA: Diagnosis not present

## 2019-05-03 DIAGNOSIS — K2100 Gastro-esophageal reflux disease with esophagitis, without bleeding: Secondary | ICD-10-CM | POA: Diagnosis not present

## 2019-05-03 DIAGNOSIS — Z9181 History of falling: Secondary | ICD-10-CM

## 2019-05-03 MED ORDER — TAMSULOSIN HCL 0.4 MG PO CAPS: 0.4000 mg | ORAL_CAPSULE | Freq: Every day | ORAL | 3 refills | Status: DC

## 2019-05-03 NOTE — Progress Notes (Signed)
BP 117/67   Pulse 65   Ht '5\' 6"'$  (1.676 m)   Wt 156 lb (70.8 kg)   SpO2 98%   BMI 25.18 kg/m    Subjective:   Patient ID: [name], male    DOB: [birth year], 86 y.o.   MRN: [id number]  HPI: [name] is a 86 y.o. male presenting on 05/03/2019 for Medical Management of Chronic Issues (3 month follow up) and Hypothyroidism   HPI Patient is coming in today complaining of increased urinary incontinence and increase issues with balance and feeling like he is going to fall and worried about falling.  He is also saying that he has been having increased memory issues and has been forgetting things that are small around the house and there is been a few times that he is driven somewhere and he has forgotten the weight had to get home and then it has come back to him.  These memory issues are coming and going but feel like they are worsening over the past year.  He would like home health therapy to help  Hypothyroidism recheck Patient is coming in for thyroid recheck today as well. They deny any issues with hair changes or heat or cold problems or diarrhea or constipation. They deny any chest pain or palpitations. They are currently on levothyroxine 75 micrograms   GERD Patient is currently on pantoprazole.  She denies any major symptoms or abdominal pain or belching or burping. She denies any blood in her stool or lightheadedness or dizziness.  Hyperlipidemia Patient is coming in for recheck of his hyperlipidemia. The patient is currently taking no medication currently we are monitoring, will recheck labs today.. They deny any issues with myalgias or history of liver damage from it. They deny any focal numbness or weakness or chest pain.   BPH Patient is coming in for recheck on BPH Symptoms: None currently Medication: Has been taking Rapaflo but wants to switch to Flomax because of cost Patient sees a urologist for his PSAs  Relevant past medical, surgical, family and social  history reviewed and updated as indicated. Interim medical history since our last visit reviewed. Allergies and medications reviewed and updated.  Review of Systems  Constitutional: Negative for chills and fever.  Eyes: Negative for visual disturbance.  Respiratory: Negative for shortness of breath and wheezing.   Cardiovascular: Negative for chest pain and leg swelling.  Genitourinary: Positive for frequency. Negative for difficulty urinating, dysuria and flank pain.  Musculoskeletal: Positive for gait problem. Negative for back pain.  Skin: Negative for rash.  Neurological: Positive for weakness. Negative for dizziness, light-headedness and numbness.  Psychiatric/Behavioral: Positive for confusion. Negative for dysphoric mood, self-injury, sleep disturbance and suicidal ideas.  All other systems reviewed and are negative.   Per HPI unless specifically indicated above   Allergies as of 05/03/2019      Reactions   Betadine [povidone Iodine] Other (See Comments)   Reaction:  Blisters    Gadavist [gadobutrol]    hives   Lortab [hydrocodone-acetaminophen] Nausea And Vomiting   Penicillins Other (See Comments)   Reaction:  Lightheadedness  Has patient had a PCN reaction causing immediate rash, facial/tongue/throat swelling, SOB or lightheadedness with hypotension: Yes Has patient had a PCN reaction causing severe rash involving mucus membranes or skin necrosis: No Has patient had a PCN reaction that required hospitalization No Has patient had a PCN reaction occurring within the last 10 years: No If all of the above answers are "NO",  then may proceed with Cephalosporin use.   Zetia [ezetimibe] Other (See Comments)   Reaction:  Muscle weakness    Zocor [simvastatin] Nausea Only, Other (See Comments)   Reaction:  Muscle weakness       Medication List       Accurate as of May 03, 2019 10:14 AM. If you have any questions, ask your nurse or doctor.        albuterol 108 (90 Base)  MCG/ACT inhaler Commonly known as: VENTOLIN HFA Inhale 2 puffs into the lungs every 6 (six) hours as needed for wheezing or shortness of breath.   bisacodyl 10 MG suppository Commonly known as: DULCOLAX Place 10 mg rectally as needed for moderate constipation.   bromocriptine 2.5 MG tablet Commonly known as: PARLODEL TAKE  (1)  TABLET TWICE A DAY.   busPIRone 15 MG tablet Commonly known as: BUSPAR Take 1 tablet (15 mg total) by mouth 2 (two) times daily as needed.   calcium carbonate 1500 (600 Ca) MG Tabs tablet Commonly known as: OSCAL Take 600 mg of elemental calcium by mouth daily with breakfast.   cholecalciferol 1000 units tablet Commonly known as: VITAMIN D Take 1,000 Units by mouth daily.   ferrous sulfate 325 (65 FE) MG tablet Take 1 tablet (325 mg total) by mouth daily with breakfast.   Flutter Devi Twice a day and prn as needed, may increase if feeling worse   GaviLAX 17 GM/SCOOP powder Generic drug: polyethylene glycol powder DISSOLVE 17 GM SCOOP IN 8 OZ. OF WATER AND DRINK ONCE DAILY AS NEEDED FOR CONSTIPATION   guaiFENesin 600 MG 12 hr tablet Commonly known as: MUCINEX Take 600 mg by mouth daily.   levalbuterol 0.63 MG/3ML nebulizer solution Commonly known as: Xopenex Take 3 mLs (0.63 mg total) by nebulization every 6 (six) hours as needed for wheezing or shortness of breath.   levothyroxine 75 MCG tablet Commonly known as: SYNTHROID Take 1 tablet (75 mcg total) by mouth daily.   magnesium oxide 400 (241.3 Mg) MG tablet Commonly known as: MAG-OX Take 400 mg by mouth every other day.   montelukast 10 MG tablet Commonly known as: SINGULAIR   naphazoline-pheniramine 0.025-0.3 % ophthalmic solution Commonly known as: NAPHCON-A Place 1 drop into both eyes 2 (two) times daily as needed for irritation or allergies.   nitroGLYCERIN 0.4 MG SL tablet Commonly known as: NITROSTAT Place 1 tablet (0.4 mg total) under the tongue every 5 (five) minutes as  needed for chest pain.   pantoprazole 40 MG tablet Commonly known as: PROTONIX 1 tablet daily   silodosin 4 MG Caps capsule Commonly known as: RAPAFLO   sucralfate 1 g tablet Commonly known as: CARAFATE TAKE (1) TABLET FOUR TIMES DAILY AS NEEDED FOR GI UPSET   Symbicort 160-4.5 MCG/ACT inhaler Generic drug: budesonide-formoterol INHALE 2 PUFFS 2 TIMES A DAY   tobramycin-dexamethasone ophthalmic solution Commonly known as: TobraDex Apply 1 drop in affected eye(s) every 2 hours for two days. Then every 4 hours for 5 days.   triamcinolone cream 0.1 % Commonly known as: KENALOG Apply 1 application topically 2 (two) times daily.   vitamin B-12 1000 MCG tablet Commonly known as: CYANOCOBALAMIN Take 1,000 mcg by mouth daily.   vitamin E 180 MG (400 UNITS) capsule Generic drug: vitamin E Take 400 Units by mouth daily.        Objective:   BP 117/67   Pulse 65   Ht '5\' 6"'$  (1.676 m)   Wt 156 lb (70.8 kg)  SpO2 98%   BMI 25.18 kg/m   Wt Readings from Last 3 Encounters:  05/03/19 156 lb (70.8 kg)  01/23/19 153 lb 9.6 oz (69.7 kg)  12/26/18 155 lb (70.3 kg)    Physical Exam Vitals and nursing note reviewed.  Constitutional:      General: He is not in acute distress.    Appearance: He is well-developed. He is not diaphoretic.  Eyes:     General: No scleral icterus.    Conjunctiva/sclera: Conjunctivae normal.  Neck:     Thyroid: No thyromegaly.  Cardiovascular:     Rate and Rhythm: Normal rate and regular rhythm.     Heart sounds: Normal heart sounds. No murmur.  Pulmonary:     Effort: Pulmonary effort is normal. No respiratory distress.     Breath sounds: Normal breath sounds. No wheezing.  Musculoskeletal:        General: Normal range of motion.     Cervical back: Neck supple.  Lymphadenopathy:     Cervical: No cervical adenopathy.  Skin:    General: Skin is warm and dry.     Findings: No rash.  Neurological:     Mental Status: He is alert and oriented to  person, place, and time.     Coordination: Coordination normal.  Psychiatric:        Behavior: Behavior normal.     MMSE 27  Assessment & Plan:   Problem List Items Addressed This Visit      Digestive   Gastroesophageal reflux disease with esophagitis   Relevant Orders   CBC with Differential/Platelet   CMP14+EGFR     Endocrine   Hypothyroidism - Primary   Relevant Orders   Thyroid Panel With TSH     Genitourinary   BPH (benign prostatic hyperplasia)   Relevant Medications   tamsulosin (FLOMAX) 0.4 MG CAPS capsule     Other   Hyperlipidemia LDL goal <70   Relevant Orders   CMP14+EGFR   Lipid panel   Memory disorder   Relevant Orders   Ambulatory referral to [place]    Other Visit Diagnoses    At high risk for injury related to fall       Relevant Orders   Ambulatory referral to [place] disorder       Relevant Orders   Ambulatory referral to [place]      Patient is having increasing memory issues, he says is been driving someplace sometimes and he will forget where he is going, he has been able to eventually figure it out but he is also forgetting things around the house more.  He does live alone so this is concerned.  He also has some balance issues and some weakness that been causing some problems.  He has not had to many falls but he is concerned that he is getting weaker and will fall.  He is also having more urinary incontinence.  Patient does see urology but he is asking today if we will switch him from Rapaflo to tamsulosin because of cost, we will go ahead and make the trade but then he will go and talk to his urologist about it make sure that that is an okay trade.  We will order home health for him to help with aid in therapy and fall prevention. Follow up plan: Return in about 3 months (around 08/03/2019), or if symptoms worsen or fail to improve, for GERD and cholesterol and thyroid recheck.  Counseling provided for  all of the  vaccine components No orders of the defined types were placed in this encounter.   Caryl Pina, MD [facility] 05/03/2019, 10:14 AM

## 2019-05-04 LAB — CMP14+EGFR
ALT: 14 IU/L (ref 0–44)
AST: 24 IU/L (ref 0–40)
Albumin/Globulin Ratio: 1.4 (ref 1.2–2.2)
Albumin: 3.8 g/dL (ref 3.6–4.6)
Alkaline Phosphatase: 89 IU/L (ref 39–117)
BUN/Creatinine Ratio: 11 (ref 10–24)
BUN: 12 mg/dL (ref 8–27)
Bilirubin Total: 0.5 mg/dL (ref 0.0–1.2)
CO2: 23 mmol/L (ref 20–29)
Calcium: 9.1 mg/dL (ref 8.6–10.2)
Chloride: 105 mmol/L (ref 96–106)
Creatinine, Ser: 1.05 mg/dL (ref 0.76–1.27)
GFR calc Af Amer: 75 mL/min/{1.73_m2} (ref >59)
GFR calc non Af Amer: 65 mL/min/{1.73_m2} (ref >59)
Globulin, Total: 2.7 g/dL (ref 1.5–4.5)
Glucose: 80 mg/dL (ref 65–99)
Potassium: 4.4 mmol/L (ref 3.5–5.2)
Sodium: 138 mmol/L (ref 134–144)
Total Protein: 6.5 g/dL (ref 6.0–8.5)

## 2019-05-04 LAB — CBC WITH DIFFERENTIAL/PLATELET
Basophils Absolute: 0.2 10*3/uL (ref 0.0–0.2)
Basos: 2 %
EOS (ABSOLUTE): 0.2 10*3/uL (ref 0.0–0.4)
Eos: 2 %
Hematocrit: 38.1 % (ref 37.5–51.0)
Hemoglobin: 12.7 g/dL — ABNORMAL LOW (ref 13.0–17.7)
Lymphocytes Absolute: 1.5 10*3/uL (ref 0.7–3.1)
Lymphs: 16 %
MCH: 32.2 pg (ref 26.6–33.0)
MCHC: 33.3 g/dL (ref 31.5–35.7)
MCV: 97 fL (ref 79–97)
Monocytes Absolute: 1.2 10*3/uL — ABNORMAL HIGH (ref 0.1–0.9)
Monocytes: 13 %
Neutrophils Absolute: 5.8 10*3/uL (ref 1.4–7.0)
Neutrophils: 63 %
Platelets: 265 10*3/uL (ref 150–450)
RBC: 3.94 x10E6/uL — ABNORMAL LOW (ref 4.14–5.80)
RDW: 13.5 % (ref 11.6–15.4)
WBC: 9.2 10*3/uL (ref 3.4–10.8)

## 2019-05-04 LAB — LIPID PANEL
Chol/HDL Ratio: 2.9 ratio (ref 0.0–5.0)
Cholesterol, Total: 176 mg/dL (ref 100–199)
HDL: 60 mg/dL (ref >39)
LDL Chol Calc (NIH): 106 mg/dL — ABNORMAL HIGH (ref 0–99)
Triglycerides: 51 mg/dL (ref 0–149)
VLDL Cholesterol Cal: 10 mg/dL (ref 5–40)

## 2019-05-04 LAB — THYROID PANEL WITH TSH
Free Thyroxine Index: 2.2 (ref 1.2–4.9)
T3 Uptake Ratio: 30 % (ref 24–39)
T4, Total: 7.2 ug/dL (ref 4.5–12.0)
TSH: 1.16 u[IU]/mL (ref 0.450–4.500)

## 2019-05-04 LAB — IMMATURE CELLS: Metamyelocytes: 4 % — ABNORMAL HIGH (ref 0–0)

## 2019-05-07 ENCOUNTER — Other Ambulatory Visit: Payer: Self-pay

## 2019-05-07 ENCOUNTER — Encounter: Payer: Self-pay | Admitting: Endocrinology

## 2019-05-07 ENCOUNTER — Ambulatory Visit (INDEPENDENT_AMBULATORY_CARE_PROVIDER_SITE_OTHER): Payer: Medicare Other | Admitting: Endocrinology

## 2019-05-07 VITALS — BP 130/70 | HR 76 | Ht 66.0 in | Wt 159.0 lb

## 2019-05-07 DIAGNOSIS — E221 Hyperprolactinemia: Secondary | ICD-10-CM

## 2019-05-07 LAB — PROLACTIN: Prolactin: 54.6 ng/mL — ABNORMAL HIGH (ref 2.0–18.0)

## 2019-05-07 NOTE — Patient Instructions (Addendum)
Blood tests are requested for you today.  We'll let you know about the results.  Based on the result, we'll probably need to change to an alternative to the bromocriptine.  Please continue the same levothyroxine.   Please come back for a follow-up appointment in 2 months.

## 2019-05-07 NOTE — Progress Notes (Signed)
Subjective:    Patient ID: [name], male    DOB: [birth year], 86 y.o.   MRN: [id number]  HPI  Pt returns for f/u of: pituitary macroadenoma (found incidentally on MRI of the C-spine in 2009; he sees Dr Carloyn Manner for this; last MRI in 2020 was unchanged, but he had a rash after Gd infusion):  denies headache.  hyperprolactinemia: (dx'ed 2009; he was rx'ed parlodel, but dosage has been limited by dizziness).  lightheadedness is still mild.   hypothyroidism:   He takes synthroid as rx'ed.   Past Medical History:  Diagnosis Date  . Abnormality of gait 06/07/2013  . Anemia   . Aneurysm ([place])    on assending aorta, currently watching it, Dr Cyndia Bent  . Anxiety   . Arthritis   . Asthma   . Atrial fibrillation ([place])   . Barrett's esophagus   . CAD (coronary artery disease) 03/2006   3.0 x 20 mm TAXUS Perseus DES to the LAD; 01/2007  L main 30%, oLAD 50%, pLAD stent ok, CFX 80%, OM 60%, pRCA 60%, mRCA 70%, oPDA 90%; med rx   . Cancer ([place])    skin cancer   . Cataract    bilateral removal of cateracts  . Complication of anesthesia     no issues,but pt prefers spinal due to Pulmonary problems  . COPD (chronic obstructive pulmonary disease) ([place])    oxygen  on standby in home.  . Depression   . Diverticulosis   . Emphysema   . Enlarged prostate with urinary retention   . Esophageal stenosis   . GERD (gastroesophageal reflux disease)   . Hiatal hernia   . Hypothyroidism   . Neuropathy   . Osteoporosis   . Pituitary macroadenoma ([place])   . Restless legs syndrome (RLS)   . UGIB (upper gastrointestinal bleed) 03/2013   EGD w/ large ulcer at GE junction    Past Surgical History:  Procedure Laterality Date  . ABDOMINAL HERNIA REPAIR   2008  . CARDIAC CATHETERIZATION  01/2007   L main 30%, oLAD 50%,  pLAD stent ok, CFX 80%, OM 60%, pRCA 60%, mRCA 70%, oPDA 90%; med rx  . cataract extraction both eyes    . COLONOSCOPY    . CORONARY STENT PLACEMENT  03/2006   3.0 x 20 mm TAXUS Perseus  DES to the LAD  . CYSTOSCOPY N/A 06/10/2015   Procedure: CYSTOSCOPY FULGRATION OF BLEEDING,  electovapor resection;  Surgeon: Irine Seal, MD;  Location: [facility];  Service: Urology;  Laterality: N/A;  . CYSTOSCOPY WITH INSERTION OF UROLIFT N/A 06/01/2015   Procedure: CYSTOSCOPY WITH INSERTION OF UROLIFT x4;  Surgeon: Irine Seal, MD;  Location: [facility];  Service: Urology;  Laterality: N/A;  . ESOPHAGOGASTRODUODENOSCOPY N/A 04/20/2013   Procedure: ESOPHAGOGASTRODUODENOSCOPY (EGD);  Surgeon: Inda Castle, MD;  Location: [facility];  Service: Endoscopy;  Laterality: N/A;  . ESOPHAGOGASTRODUODENOSCOPY N/A 03/25/2016   Procedure: ESOPHAGOGASTRODUODENOSCOPY (EGD);  Surgeon: Irene Shipper, MD;  Location: [facility];  Service: Endoscopy;  Laterality: N/A;  . ESOPHAGOGASTRODUODENOSCOPY (EGD) WITH PROPOFOL N/A 10/13/2015   Procedure: ESOPHAGOGASTRODUODENOSCOPY (EGD) WITH PROPOFOL;  Surgeon: Jerene Bears, MD;  [facility];  Service: Gastroenterology;  Laterality: N/A;  . FOOT SURGERY  1994 left, 2002 right foot   bilateral foot reconstruciton  . FOOT SURGERY     reconstruction of both feet- no retained hardware.  Marland Kitchen HIATAL HERNIA REPAIR  01-04-2008  . MELANOMA EXCISION  2019  . POLYPECTOMY    .  PROSTATE SURGERY     x 2  . SAVORY DILATION N/A 03/25/2016   Procedure: SAVORY DILATION;  Surgeon: Irene Shipper, MD;  [facility];  Service: Endoscopy;  Laterality: N/A;  . TOTAL HIP ARTHROPLASTY  07/21/2011   Procedure: TOTAL HIP ARTHROPLASTY ANTERIOR APPROACH;  Surgeon: Mauri Pole, MD;  Location: [facility];  Service: Orthopedics;  Laterality: Left;  . TOTAL HIP ARTHROPLASTY Right 09/07/2012   Procedure: RIGHT TOTAL HIP ARTHROPLASTY ANTERIOR APPROACH;  Surgeon: Mcarthur Rossetti, MD;  Location: [facility];  Service: Orthopedics;  Laterality: Right;  . TRANSURETHRAL RESECTION OF BLADDER NECK N/A 11/04/2015   Procedure: RESECTION OF BLADDER NECK;  Surgeon: Cleon Gustin, MD;  Location: [facility];   Service: Urology;  Laterality: N/A;  . TRANSURETHRAL RESECTION OF PROSTATE N/A 11/04/2015   Procedure: TRANSURETHRAL RESECTION OF THE PROSTATE (TURP); REMOVAL OF UROLIFT IMPLANTS X THREE;  Surgeon: Cleon Gustin, MD;  Location: [facility];  Service: Urology;  Laterality: N/A;  . UPPER GASTROINTESTINAL ENDOSCOPY  12/2013   Dr Hilarie Fredrickson, gastritis  . VIDEO BRONCHOSCOPY Bilateral 02/01/2017   Procedure: VIDEO BRONCHOSCOPY WITHOUT FLUORO;  Surgeon: Tanda Rockers, MD;  [facility];  Service: Cardiopulmonary;  Laterality: Bilateral;    Social History   Socioeconomic History  . Marital status: Widowed    Spouse name: Not on file  . Number of children: 2  . Years of education: 28  . Highest education level: Associate degree: occupational, Hotel manager, or vocational program  Occupational History  . Occupation: retired  Tobacco Use  . Smoking status: Never Smoker  . Smokeless tobacco: Never Used  Substance and Sexual Activity  . Alcohol use: No  . Drug use: No  . Sexual activity: Not Currently    Birth control/protection: None  Other Topics Concern  . Not on file  Social History Narrative   Patient drinks caffeine a few times a week.   Patient is right handed.   Social Determinants of Health   Financial Resource Strain:   . Difficulty of Paying Living Expenses:   Food Insecurity:   . Worried About Charity fundraiser in the Last Year:   . Arboriculturist in the Last Year:   Transportation Needs:   . Film/video editor (Medical):   Marland Kitchen Lack of Transportation (Non-Medical):   Physical Activity: Insufficiently Active  . Days of Exercise per Week: 5 days  . Minutes of Exercise per Session: 20 min  Stress: No Stress Concern Present  . Feeling of Stress : Not at all  Social Connections:   . Frequency of Communication with Friends and Family:   . Frequency of Social Gatherings with Friends and Family:   . Attends Religious Services:   . Active Member of Clubs or  Organizations:   . Attends Archivist Meetings:   Marland Kitchen Marital Status:   Intimate Partner Violence:   . Fear of Current or Ex-Partner:   . Emotionally Abused:   Marland Kitchen Physically Abused:   . Sexually Abused:     Current Outpatient Medications on File Prior to Visit  Medication Sig Dispense Refill  . albuterol (PROVENTIL HFA;VENTOLIN HFA) 108 (90 Base) MCG/ACT inhaler Inhale 2 puffs into the lungs every 6 (six) hours as needed for wheezing or shortness of breath. 18 g 2  . bisacodyl (DULCOLAX) 10 MG suppository Place 10 mg rectally as needed for moderate constipation.    . busPIRone (BUSPAR) 15 MG tablet Take 1 tablet (15 mg total)  by mouth 2 (two) times daily as needed. 60 tablet 0  . calcium carbonate (OSCAL) 1500 (600 Ca) MG TABS tablet Take 600 mg of elemental calcium by mouth daily with breakfast.     . cholecalciferol (VITAMIN D) 1000 UNITS tablet Take 1,000 Units by mouth daily.     . ferrous sulfate 325 (65 FE) MG tablet Take 1 tablet (325 mg total) by mouth daily with breakfast. 90 tablet 3  . GAVILAX 17 GM/SCOOP powder DISSOLVE 17 GM SCOOP IN 8 OZ. OF WATER AND DRINK ONCE DAILY AS NEEDED FOR CONSTIPATION 510 g 0  . guaiFENesin (MUCINEX) 600 MG 12 hr tablet Take 600 mg by mouth daily.     Marland Kitchen levalbuterol (XOPENEX) 0.63 MG/3ML nebulizer solution Take 3 mLs (0.63 mg total) by nebulization every 6 (six) hours as needed for wheezing or shortness of breath. 72 mL 1  . levothyroxine (SYNTHROID) 75 MCG tablet Take 1 tablet (75 mcg total) by mouth daily. 90 tablet 3  . magnesium oxide (MAG-OX) 400 (241.3 Mg) MG tablet Take 400 mg by mouth every other day.    . montelukast (SINGULAIR) 10 MG tablet     . naphazoline-pheniramine (NAPHCON-A) 0.025-0.3 % ophthalmic solution Place 1 drop into both eyes 2 (two) times daily as needed for irritation or allergies.     . nitroGLYCERIN (NITROSTAT) 0.4 MG SL tablet Place 1 tablet (0.4 mg total) under the tongue every 5 (five) minutes as needed for chest  pain. 25 tablet 2  . pantoprazole (PROTONIX) 40 MG tablet 1 tablet daily 180 tablet 3  . Respiratory Therapy Supplies (FLUTTER) DEVI Twice a day and prn as needed, may increase if feeling worse 1 each 0  . sucralfate (CARAFATE) 1 g tablet TAKE (1) TABLET FOUR TIMES DAILY AS NEEDED FOR GI UPSET 120 tablet 0  . SYMBICORT 160-4.5 MCG/ACT inhaler INHALE 2 PUFFS 2 TIMES A DAY 10.2 g 5  . tamsulosin (FLOMAX) 0.4 MG CAPS capsule Take 1 capsule (0.4 mg total) by mouth daily. 30 capsule 3  . tobramycin-dexamethasone (TOBRADEX) ophthalmic solution Apply 1 drop in affected eye(s) every 2 hours for two days. Then every 4 hours for 5 days. 5 mL 0  . triamcinolone cream (KENALOG) 0.1 % Apply 1 application topically 2 (two) times daily. 30 g 0  . vitamin B-12 (CYANOCOBALAMIN) 1000 MCG tablet Take 1,000 mcg by mouth daily.    . vitamin E (VITAMIN E) 400 UNIT capsule Take 400 Units by mouth daily.     No current facility-administered medications on file prior to visit.    Allergies  Allergen Reactions  . Betadine [Povidone Iodine] Other (See Comments)    Reaction:  Blisters   . Gadavist [Gadobutrol]     hives  . Lortab [Hydrocodone-Acetaminophen] Nausea And Vomiting  . Penicillins Other (See Comments)    Reaction:  Lightheadedness  Has patient had a PCN reaction causing immediate rash, facial/tongue/throat swelling, SOB or lightheadedness with hypotension: Yes Has patient had a PCN reaction causing severe rash involving mucus membranes or skin necrosis: No Has patient had a PCN reaction that required hospitalization No Has patient had a PCN reaction occurring within the last 10 years: No If all of the above answers are "NO", then may proceed with Cephalosporin use.  Marland Kitchen Zetia [Ezetimibe] Other (See Comments)    Reaction:  Muscle weakness   . Zocor [Simvastatin] Nausea Only and Other (See Comments)    Reaction:  Muscle weakness     Family History  Problem  Relation Age of Onset  . Heart disease Sister    . Heart failure Father   . Other Mother        phebitis related to Lawarence's birth, he was 4 weeks old when she died  . Colon cancer Neg Hx   . Esophageal cancer Neg Hx   . Rectal cancer Neg Hx   . Stomach cancer Neg Hx   . Colon polyps Neg Hx     BP 130/70 (BP Location: Left Arm, Patient Position: Sitting, Cuff Size: Normal)   Pulse 76   Ht 5\' 6"  (1.676 m)   Wt 159 lb (72.1 kg)   SpO2 95%   BMI 25.66 kg/m    Review of Systems He has mild memory loss.      Objective:   Physical Exam VITAL SIGNS:  See vs page GENERAL: no distress NECK: There is no palpable thyroid enlargement.  No thyroid nodule is palpable.  No palpable lymphadenopathy at the anterior neck.  GAIT: slow but steady  Lab Results  Component Value Date   CREATININE 1.05 05/03/2019   BUN 12 05/03/2019   NA 138 05/03/2019   K 4.4 05/03/2019   CL 105 05/03/2019   CO2 23 05/03/2019     Lab Results  Component Value Date   TSH 1.160 05/03/2019   T4TOTAL 7.2 05/03/2019       Assessment & Plan:  Pituitary adenoma: We discussed.   ADR, apparently due to Gd: He wants to skip imaging this year.   Hypothyroidism: well-replaced Hyperprolactinemia: recheck today Lightheadedness: we may need to change to cabergoline  Patient Instructions  Blood tests are requested for you today.  We'll let you know about the results.  Based on the result, we'll probably need to change to an alternative to the bromocriptine.  Please continue the same levothyroxine.   Please come back for a follow-up appointment in 2 months.

## 2019-05-08 MED ORDER — CABERGOLINE 0.5 MG PO TABS: 0.2500 mg | ORAL_TABLET | ORAL | 2 refills | Status: DC

## 2019-05-09 ENCOUNTER — Encounter: Payer: Self-pay | Admitting: Endocrinology

## 2019-05-09 NOTE — Telephone Encounter (Signed)
Dr. Melvyn Novas, please see pt's mychart message and advise on it for him.

## 2019-05-09 NOTE — Telephone Encounter (Signed)
It's a judgement call but in this case likley the shot revved up the immune systems and caused more cough, not more infection, so can hold off and use the rules as before  > thick nasty mucus is the indicator need to pull the trigger on the cipro and take as directed - available in am 3/12 for ov if not feeling better

## 2019-05-15 DIAGNOSIS — I4891 Unspecified atrial fibrillation: Secondary | ICD-10-CM | POA: Diagnosis not present

## 2019-05-15 DIAGNOSIS — Z87891 Personal history of nicotine dependence: Secondary | ICD-10-CM | POA: Diagnosis not present

## 2019-05-15 DIAGNOSIS — R2681 Unsteadiness on feet: Secondary | ICD-10-CM | POA: Diagnosis not present

## 2019-05-15 DIAGNOSIS — I712 Thoracic aortic aneurysm, without rupture: Secondary | ICD-10-CM | POA: Diagnosis not present

## 2019-05-15 DIAGNOSIS — I700 Atherosclerosis of aorta: Secondary | ICD-10-CM | POA: Diagnosis not present

## 2019-05-15 DIAGNOSIS — Z9181 History of falling: Secondary | ICD-10-CM | POA: Diagnosis not present

## 2019-05-15 DIAGNOSIS — M810 Age-related osteoporosis without current pathological fracture: Secondary | ICD-10-CM | POA: Diagnosis not present

## 2019-05-15 DIAGNOSIS — J449 Chronic obstructive pulmonary disease, unspecified: Secondary | ICD-10-CM | POA: Diagnosis not present

## 2019-05-15 DIAGNOSIS — E785 Hyperlipidemia, unspecified: Secondary | ICD-10-CM | POA: Diagnosis not present

## 2019-05-15 DIAGNOSIS — G629 Polyneuropathy, unspecified: Secondary | ICD-10-CM | POA: Diagnosis not present

## 2019-05-15 DIAGNOSIS — K2100 Gastro-esophageal reflux disease with esophagitis, without bleeding: Secondary | ICD-10-CM | POA: Diagnosis not present

## 2019-05-15 DIAGNOSIS — M1990 Unspecified osteoarthritis, unspecified site: Secondary | ICD-10-CM | POA: Diagnosis not present

## 2019-05-15 DIAGNOSIS — I2510 Atherosclerotic heart disease of native coronary artery without angina pectoris: Secondary | ICD-10-CM | POA: Diagnosis not present

## 2019-05-15 DIAGNOSIS — I739 Peripheral vascular disease, unspecified: Secondary | ICD-10-CM | POA: Diagnosis not present

## 2019-05-15 DIAGNOSIS — E039 Hypothyroidism, unspecified: Secondary | ICD-10-CM | POA: Diagnosis not present

## 2019-05-15 DIAGNOSIS — R350 Frequency of micturition: Secondary | ICD-10-CM | POA: Diagnosis not present

## 2019-05-15 DIAGNOSIS — R413 Other amnesia: Secondary | ICD-10-CM | POA: Diagnosis not present

## 2019-05-20 ENCOUNTER — Encounter: Payer: Self-pay | Admitting: Endocrinology

## 2019-05-20 DIAGNOSIS — I2510 Atherosclerotic heart disease of native coronary artery without angina pectoris: Secondary | ICD-10-CM | POA: Diagnosis not present

## 2019-05-20 DIAGNOSIS — K2100 Gastro-esophageal reflux disease with esophagitis, without bleeding: Secondary | ICD-10-CM | POA: Diagnosis not present

## 2019-05-20 DIAGNOSIS — M810 Age-related osteoporosis without current pathological fracture: Secondary | ICD-10-CM | POA: Diagnosis not present

## 2019-05-20 DIAGNOSIS — I739 Peripheral vascular disease, unspecified: Secondary | ICD-10-CM | POA: Diagnosis not present

## 2019-05-20 DIAGNOSIS — E039 Hypothyroidism, unspecified: Secondary | ICD-10-CM | POA: Diagnosis not present

## 2019-05-20 DIAGNOSIS — Z9181 History of falling: Secondary | ICD-10-CM | POA: Diagnosis not present

## 2019-05-20 DIAGNOSIS — J449 Chronic obstructive pulmonary disease, unspecified: Secondary | ICD-10-CM | POA: Diagnosis not present

## 2019-05-20 DIAGNOSIS — R350 Frequency of micturition: Secondary | ICD-10-CM | POA: Diagnosis not present

## 2019-05-20 DIAGNOSIS — G629 Polyneuropathy, unspecified: Secondary | ICD-10-CM | POA: Diagnosis not present

## 2019-05-20 DIAGNOSIS — R2681 Unsteadiness on feet: Secondary | ICD-10-CM | POA: Diagnosis not present

## 2019-05-20 DIAGNOSIS — I700 Atherosclerosis of aorta: Secondary | ICD-10-CM | POA: Diagnosis not present

## 2019-05-20 DIAGNOSIS — I712 Thoracic aortic aneurysm, without rupture: Secondary | ICD-10-CM | POA: Diagnosis not present

## 2019-05-20 DIAGNOSIS — I4891 Unspecified atrial fibrillation: Secondary | ICD-10-CM | POA: Diagnosis not present

## 2019-05-20 DIAGNOSIS — Z87891 Personal history of nicotine dependence: Secondary | ICD-10-CM | POA: Diagnosis not present

## 2019-05-20 DIAGNOSIS — M1990 Unspecified osteoarthritis, unspecified site: Secondary | ICD-10-CM | POA: Diagnosis not present

## 2019-05-20 DIAGNOSIS — E785 Hyperlipidemia, unspecified: Secondary | ICD-10-CM | POA: Diagnosis not present

## 2019-05-20 DIAGNOSIS — R413 Other amnesia: Secondary | ICD-10-CM | POA: Diagnosis not present

## 2019-05-21 DIAGNOSIS — R2681 Unsteadiness on feet: Secondary | ICD-10-CM | POA: Diagnosis not present

## 2019-05-21 DIAGNOSIS — I4891 Unspecified atrial fibrillation: Secondary | ICD-10-CM | POA: Diagnosis not present

## 2019-05-21 DIAGNOSIS — R350 Frequency of micturition: Secondary | ICD-10-CM | POA: Diagnosis not present

## 2019-05-21 DIAGNOSIS — I700 Atherosclerosis of aorta: Secondary | ICD-10-CM | POA: Diagnosis not present

## 2019-05-21 DIAGNOSIS — K2100 Gastro-esophageal reflux disease with esophagitis, without bleeding: Secondary | ICD-10-CM | POA: Diagnosis not present

## 2019-05-21 DIAGNOSIS — Z9181 History of falling: Secondary | ICD-10-CM | POA: Diagnosis not present

## 2019-05-21 DIAGNOSIS — J449 Chronic obstructive pulmonary disease, unspecified: Secondary | ICD-10-CM | POA: Diagnosis not present

## 2019-05-21 DIAGNOSIS — G629 Polyneuropathy, unspecified: Secondary | ICD-10-CM | POA: Diagnosis not present

## 2019-05-21 DIAGNOSIS — M1990 Unspecified osteoarthritis, unspecified site: Secondary | ICD-10-CM | POA: Diagnosis not present

## 2019-05-21 DIAGNOSIS — I712 Thoracic aortic aneurysm, without rupture: Secondary | ICD-10-CM | POA: Diagnosis not present

## 2019-05-21 DIAGNOSIS — M810 Age-related osteoporosis without current pathological fracture: Secondary | ICD-10-CM | POA: Diagnosis not present

## 2019-05-21 DIAGNOSIS — E785 Hyperlipidemia, unspecified: Secondary | ICD-10-CM | POA: Diagnosis not present

## 2019-05-21 DIAGNOSIS — I739 Peripheral vascular disease, unspecified: Secondary | ICD-10-CM | POA: Diagnosis not present

## 2019-05-21 DIAGNOSIS — R413 Other amnesia: Secondary | ICD-10-CM | POA: Diagnosis not present

## 2019-05-21 DIAGNOSIS — Z87891 Personal history of nicotine dependence: Secondary | ICD-10-CM | POA: Diagnosis not present

## 2019-05-21 DIAGNOSIS — E039 Hypothyroidism, unspecified: Secondary | ICD-10-CM | POA: Diagnosis not present

## 2019-05-21 DIAGNOSIS — I2510 Atherosclerotic heart disease of native coronary artery without angina pectoris: Secondary | ICD-10-CM | POA: Diagnosis not present

## 2019-05-22 DIAGNOSIS — I739 Peripheral vascular disease, unspecified: Secondary | ICD-10-CM | POA: Diagnosis not present

## 2019-05-22 DIAGNOSIS — I4891 Unspecified atrial fibrillation: Secondary | ICD-10-CM | POA: Diagnosis not present

## 2019-05-22 DIAGNOSIS — I2510 Atherosclerotic heart disease of native coronary artery without angina pectoris: Secondary | ICD-10-CM | POA: Diagnosis not present

## 2019-05-22 DIAGNOSIS — Z9181 History of falling: Secondary | ICD-10-CM | POA: Diagnosis not present

## 2019-05-22 DIAGNOSIS — E785 Hyperlipidemia, unspecified: Secondary | ICD-10-CM | POA: Diagnosis not present

## 2019-05-22 DIAGNOSIS — R2681 Unsteadiness on feet: Secondary | ICD-10-CM | POA: Diagnosis not present

## 2019-05-22 DIAGNOSIS — M1990 Unspecified osteoarthritis, unspecified site: Secondary | ICD-10-CM | POA: Diagnosis not present

## 2019-05-22 DIAGNOSIS — E039 Hypothyroidism, unspecified: Secondary | ICD-10-CM | POA: Diagnosis not present

## 2019-05-22 DIAGNOSIS — J449 Chronic obstructive pulmonary disease, unspecified: Secondary | ICD-10-CM | POA: Diagnosis not present

## 2019-05-22 DIAGNOSIS — K2100 Gastro-esophageal reflux disease with esophagitis, without bleeding: Secondary | ICD-10-CM | POA: Diagnosis not present

## 2019-05-22 DIAGNOSIS — Z87891 Personal history of nicotine dependence: Secondary | ICD-10-CM | POA: Diagnosis not present

## 2019-05-22 DIAGNOSIS — I700 Atherosclerosis of aorta: Secondary | ICD-10-CM | POA: Diagnosis not present

## 2019-05-22 DIAGNOSIS — I712 Thoracic aortic aneurysm, without rupture: Secondary | ICD-10-CM | POA: Diagnosis not present

## 2019-05-22 DIAGNOSIS — G629 Polyneuropathy, unspecified: Secondary | ICD-10-CM | POA: Diagnosis not present

## 2019-05-22 DIAGNOSIS — M810 Age-related osteoporosis without current pathological fracture: Secondary | ICD-10-CM | POA: Diagnosis not present

## 2019-05-22 DIAGNOSIS — R413 Other amnesia: Secondary | ICD-10-CM | POA: Diagnosis not present

## 2019-05-22 DIAGNOSIS — R350 Frequency of micturition: Secondary | ICD-10-CM | POA: Diagnosis not present

## 2019-05-24 DIAGNOSIS — I4891 Unspecified atrial fibrillation: Secondary | ICD-10-CM | POA: Diagnosis not present

## 2019-05-24 DIAGNOSIS — J449 Chronic obstructive pulmonary disease, unspecified: Secondary | ICD-10-CM | POA: Diagnosis not present

## 2019-05-24 DIAGNOSIS — R413 Other amnesia: Secondary | ICD-10-CM | POA: Diagnosis not present

## 2019-05-24 DIAGNOSIS — I739 Peripheral vascular disease, unspecified: Secondary | ICD-10-CM | POA: Diagnosis not present

## 2019-05-24 DIAGNOSIS — R350 Frequency of micturition: Secondary | ICD-10-CM | POA: Diagnosis not present

## 2019-05-24 DIAGNOSIS — R2681 Unsteadiness on feet: Secondary | ICD-10-CM | POA: Diagnosis not present

## 2019-05-24 DIAGNOSIS — E039 Hypothyroidism, unspecified: Secondary | ICD-10-CM | POA: Diagnosis not present

## 2019-05-24 DIAGNOSIS — E785 Hyperlipidemia, unspecified: Secondary | ICD-10-CM | POA: Diagnosis not present

## 2019-05-24 DIAGNOSIS — G629 Polyneuropathy, unspecified: Secondary | ICD-10-CM | POA: Diagnosis not present

## 2019-05-24 DIAGNOSIS — M1990 Unspecified osteoarthritis, unspecified site: Secondary | ICD-10-CM | POA: Diagnosis not present

## 2019-05-24 DIAGNOSIS — Z9181 History of falling: Secondary | ICD-10-CM | POA: Diagnosis not present

## 2019-05-24 DIAGNOSIS — I2510 Atherosclerotic heart disease of native coronary artery without angina pectoris: Secondary | ICD-10-CM | POA: Diagnosis not present

## 2019-05-24 DIAGNOSIS — K2100 Gastro-esophageal reflux disease with esophagitis, without bleeding: Secondary | ICD-10-CM | POA: Diagnosis not present

## 2019-05-24 DIAGNOSIS — M810 Age-related osteoporosis without current pathological fracture: Secondary | ICD-10-CM | POA: Diagnosis not present

## 2019-05-24 DIAGNOSIS — I700 Atherosclerosis of aorta: Secondary | ICD-10-CM | POA: Diagnosis not present

## 2019-05-24 DIAGNOSIS — I712 Thoracic aortic aneurysm, without rupture: Secondary | ICD-10-CM | POA: Diagnosis not present

## 2019-05-24 DIAGNOSIS — Z87891 Personal history of nicotine dependence: Secondary | ICD-10-CM | POA: Diagnosis not present

## 2019-05-25 ENCOUNTER — Encounter: Payer: Self-pay | Admitting: Endocrinology

## 2019-05-27 ENCOUNTER — Encounter: Payer: Self-pay | Admitting: Endocrinology

## 2019-05-28 ENCOUNTER — Other Ambulatory Visit: Payer: Self-pay

## 2019-05-28 ENCOUNTER — Other Ambulatory Visit: Payer: Self-pay | Admitting: Family Medicine

## 2019-05-28 DIAGNOSIS — K2100 Gastro-esophageal reflux disease with esophagitis, without bleeding: Secondary | ICD-10-CM | POA: Diagnosis not present

## 2019-05-28 DIAGNOSIS — J449 Chronic obstructive pulmonary disease, unspecified: Secondary | ICD-10-CM | POA: Diagnosis not present

## 2019-05-28 DIAGNOSIS — Z9181 History of falling: Secondary | ICD-10-CM | POA: Diagnosis not present

## 2019-05-28 DIAGNOSIS — E039 Hypothyroidism, unspecified: Secondary | ICD-10-CM | POA: Diagnosis not present

## 2019-05-28 DIAGNOSIS — G629 Polyneuropathy, unspecified: Secondary | ICD-10-CM | POA: Diagnosis not present

## 2019-05-28 DIAGNOSIS — R350 Frequency of micturition: Secondary | ICD-10-CM | POA: Diagnosis not present

## 2019-05-28 DIAGNOSIS — E785 Hyperlipidemia, unspecified: Secondary | ICD-10-CM | POA: Diagnosis not present

## 2019-05-28 DIAGNOSIS — Z87891 Personal history of nicotine dependence: Secondary | ICD-10-CM | POA: Diagnosis not present

## 2019-05-28 DIAGNOSIS — I700 Atherosclerosis of aorta: Secondary | ICD-10-CM | POA: Diagnosis not present

## 2019-05-28 DIAGNOSIS — R413 Other amnesia: Secondary | ICD-10-CM | POA: Diagnosis not present

## 2019-05-28 DIAGNOSIS — R2681 Unsteadiness on feet: Secondary | ICD-10-CM | POA: Diagnosis not present

## 2019-05-28 DIAGNOSIS — I739 Peripheral vascular disease, unspecified: Secondary | ICD-10-CM | POA: Diagnosis not present

## 2019-05-28 DIAGNOSIS — I712 Thoracic aortic aneurysm, without rupture: Secondary | ICD-10-CM | POA: Diagnosis not present

## 2019-05-28 DIAGNOSIS — M1990 Unspecified osteoarthritis, unspecified site: Secondary | ICD-10-CM | POA: Diagnosis not present

## 2019-05-28 DIAGNOSIS — I2510 Atherosclerotic heart disease of native coronary artery without angina pectoris: Secondary | ICD-10-CM | POA: Diagnosis not present

## 2019-05-28 DIAGNOSIS — M810 Age-related osteoporosis without current pathological fracture: Secondary | ICD-10-CM | POA: Diagnosis not present

## 2019-05-28 DIAGNOSIS — E221 Hyperprolactinemia: Secondary | ICD-10-CM

## 2019-05-28 DIAGNOSIS — I4891 Unspecified atrial fibrillation: Secondary | ICD-10-CM | POA: Diagnosis not present

## 2019-05-28 MED ORDER — BROMOCRIPTINE MESYLATE 2.5 MG PO TABS: 2.5000 mg | ORAL_TABLET | Freq: Two times a day (BID) | ORAL | 2 refills | Status: DC

## 2019-05-30 DIAGNOSIS — J449 Chronic obstructive pulmonary disease, unspecified: Secondary | ICD-10-CM | POA: Diagnosis not present

## 2019-05-30 DIAGNOSIS — M1990 Unspecified osteoarthritis, unspecified site: Secondary | ICD-10-CM | POA: Diagnosis not present

## 2019-05-30 DIAGNOSIS — I2510 Atherosclerotic heart disease of native coronary artery without angina pectoris: Secondary | ICD-10-CM | POA: Diagnosis not present

## 2019-05-30 DIAGNOSIS — E039 Hypothyroidism, unspecified: Secondary | ICD-10-CM | POA: Diagnosis not present

## 2019-05-30 DIAGNOSIS — R413 Other amnesia: Secondary | ICD-10-CM | POA: Diagnosis not present

## 2019-05-30 DIAGNOSIS — K2100 Gastro-esophageal reflux disease with esophagitis, without bleeding: Secondary | ICD-10-CM | POA: Diagnosis not present

## 2019-05-30 DIAGNOSIS — I700 Atherosclerosis of aorta: Secondary | ICD-10-CM | POA: Diagnosis not present

## 2019-05-30 DIAGNOSIS — I4891 Unspecified atrial fibrillation: Secondary | ICD-10-CM | POA: Diagnosis not present

## 2019-05-30 DIAGNOSIS — R2681 Unsteadiness on feet: Secondary | ICD-10-CM | POA: Diagnosis not present

## 2019-05-30 DIAGNOSIS — R350 Frequency of micturition: Secondary | ICD-10-CM | POA: Diagnosis not present

## 2019-05-30 DIAGNOSIS — E785 Hyperlipidemia, unspecified: Secondary | ICD-10-CM | POA: Diagnosis not present

## 2019-05-30 DIAGNOSIS — M810 Age-related osteoporosis without current pathological fracture: Secondary | ICD-10-CM | POA: Diagnosis not present

## 2019-05-30 DIAGNOSIS — I712 Thoracic aortic aneurysm, without rupture: Secondary | ICD-10-CM | POA: Diagnosis not present

## 2019-05-30 DIAGNOSIS — I739 Peripheral vascular disease, unspecified: Secondary | ICD-10-CM | POA: Diagnosis not present

## 2019-05-30 DIAGNOSIS — Z87891 Personal history of nicotine dependence: Secondary | ICD-10-CM | POA: Diagnosis not present

## 2019-05-30 DIAGNOSIS — Z9181 History of falling: Secondary | ICD-10-CM | POA: Diagnosis not present

## 2019-05-30 DIAGNOSIS — G629 Polyneuropathy, unspecified: Secondary | ICD-10-CM | POA: Diagnosis not present

## 2019-05-31 DIAGNOSIS — I4891 Unspecified atrial fibrillation: Secondary | ICD-10-CM | POA: Diagnosis not present

## 2019-05-31 DIAGNOSIS — E785 Hyperlipidemia, unspecified: Secondary | ICD-10-CM | POA: Diagnosis not present

## 2019-05-31 DIAGNOSIS — Z9181 History of falling: Secondary | ICD-10-CM | POA: Diagnosis not present

## 2019-05-31 DIAGNOSIS — M1990 Unspecified osteoarthritis, unspecified site: Secondary | ICD-10-CM | POA: Diagnosis not present

## 2019-05-31 DIAGNOSIS — G629 Polyneuropathy, unspecified: Secondary | ICD-10-CM | POA: Diagnosis not present

## 2019-05-31 DIAGNOSIS — R2681 Unsteadiness on feet: Secondary | ICD-10-CM | POA: Diagnosis not present

## 2019-05-31 DIAGNOSIS — K2100 Gastro-esophageal reflux disease with esophagitis, without bleeding: Secondary | ICD-10-CM | POA: Diagnosis not present

## 2019-05-31 DIAGNOSIS — R413 Other amnesia: Secondary | ICD-10-CM | POA: Diagnosis not present

## 2019-05-31 DIAGNOSIS — I739 Peripheral vascular disease, unspecified: Secondary | ICD-10-CM | POA: Diagnosis not present

## 2019-05-31 DIAGNOSIS — I2510 Atherosclerotic heart disease of native coronary artery without angina pectoris: Secondary | ICD-10-CM | POA: Diagnosis not present

## 2019-05-31 DIAGNOSIS — Z87891 Personal history of nicotine dependence: Secondary | ICD-10-CM | POA: Diagnosis not present

## 2019-05-31 DIAGNOSIS — J449 Chronic obstructive pulmonary disease, unspecified: Secondary | ICD-10-CM | POA: Diagnosis not present

## 2019-05-31 DIAGNOSIS — I712 Thoracic aortic aneurysm, without rupture: Secondary | ICD-10-CM | POA: Diagnosis not present

## 2019-05-31 DIAGNOSIS — I700 Atherosclerosis of aorta: Secondary | ICD-10-CM | POA: Diagnosis not present

## 2019-05-31 DIAGNOSIS — R350 Frequency of micturition: Secondary | ICD-10-CM | POA: Diagnosis not present

## 2019-05-31 DIAGNOSIS — E039 Hypothyroidism, unspecified: Secondary | ICD-10-CM | POA: Diagnosis not present

## 2019-05-31 DIAGNOSIS — M810 Age-related osteoporosis without current pathological fracture: Secondary | ICD-10-CM | POA: Diagnosis not present

## 2019-05-31 IMAGING — DX DG CHEST 2V
2 series · 2 of 2 positions shown · non-contrast
Comparison: CT chest of 11/21/2016 and chest x-ray of 06/13/2016

CLINICAL DATA: Cough and congestion

EXAM:
CHEST  2 VIEW

[chest pa]
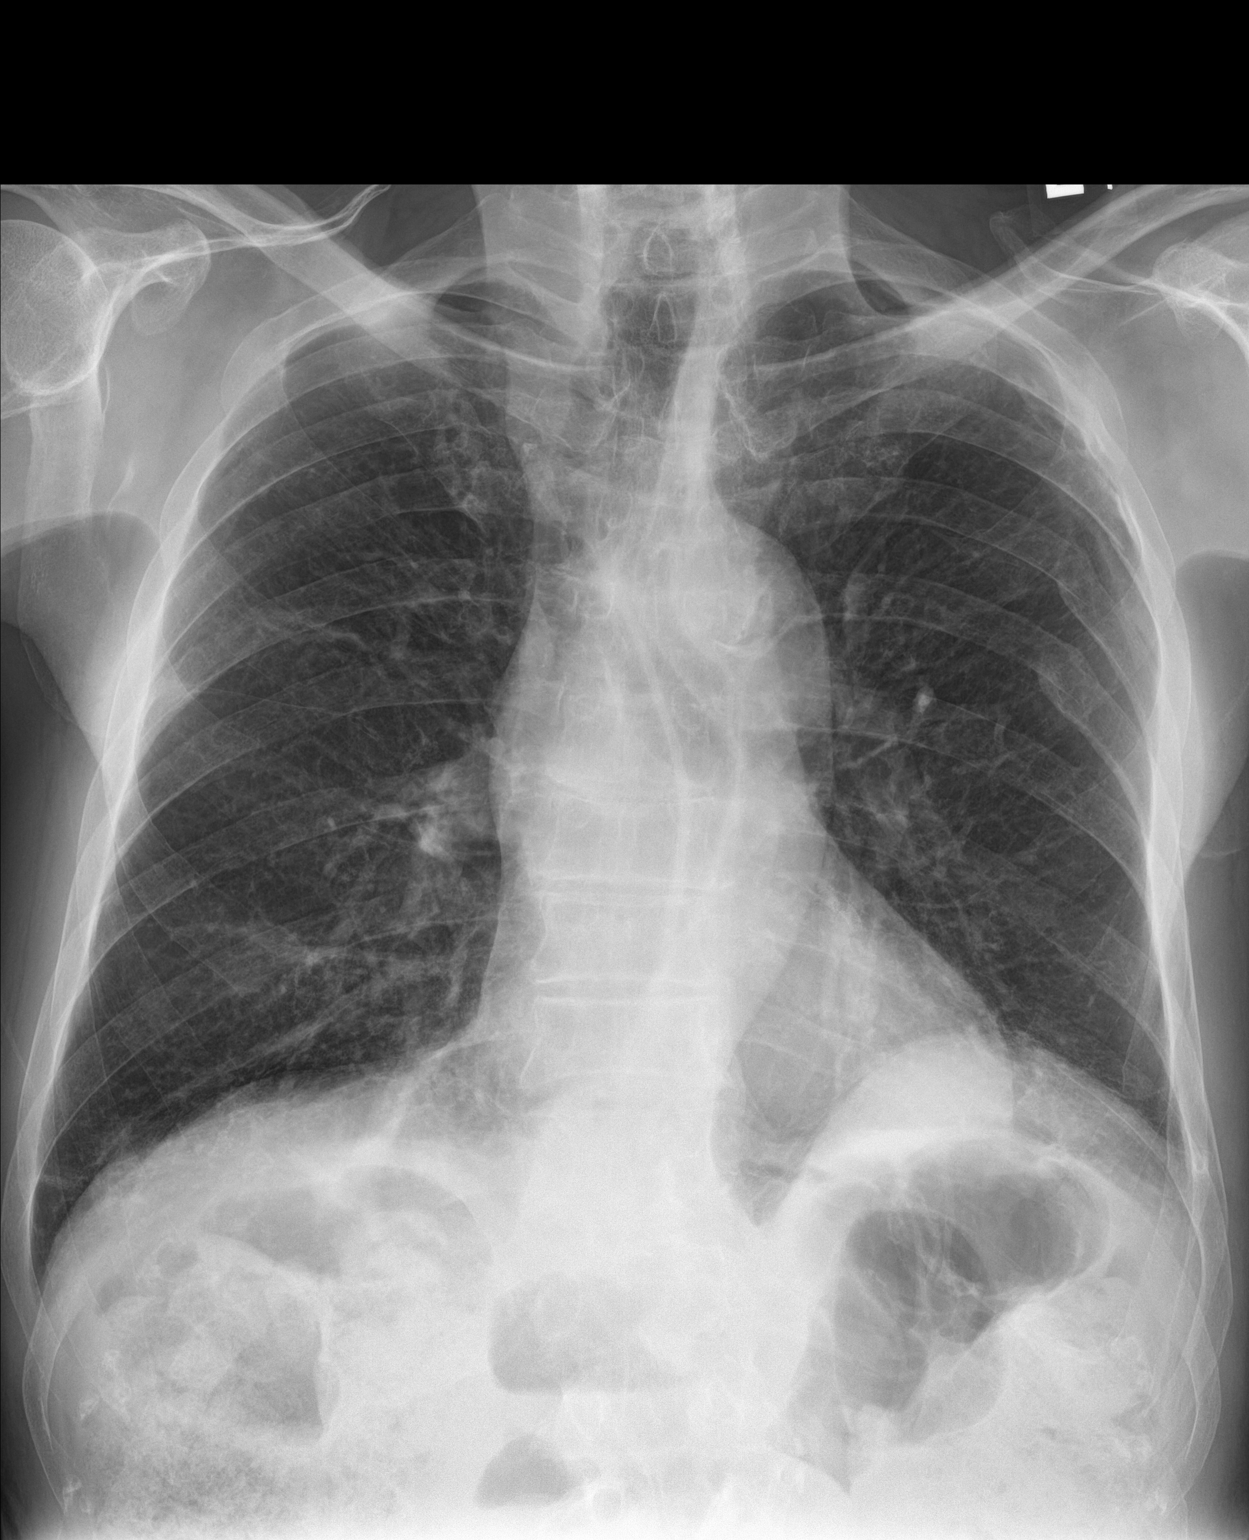

[chest lat]
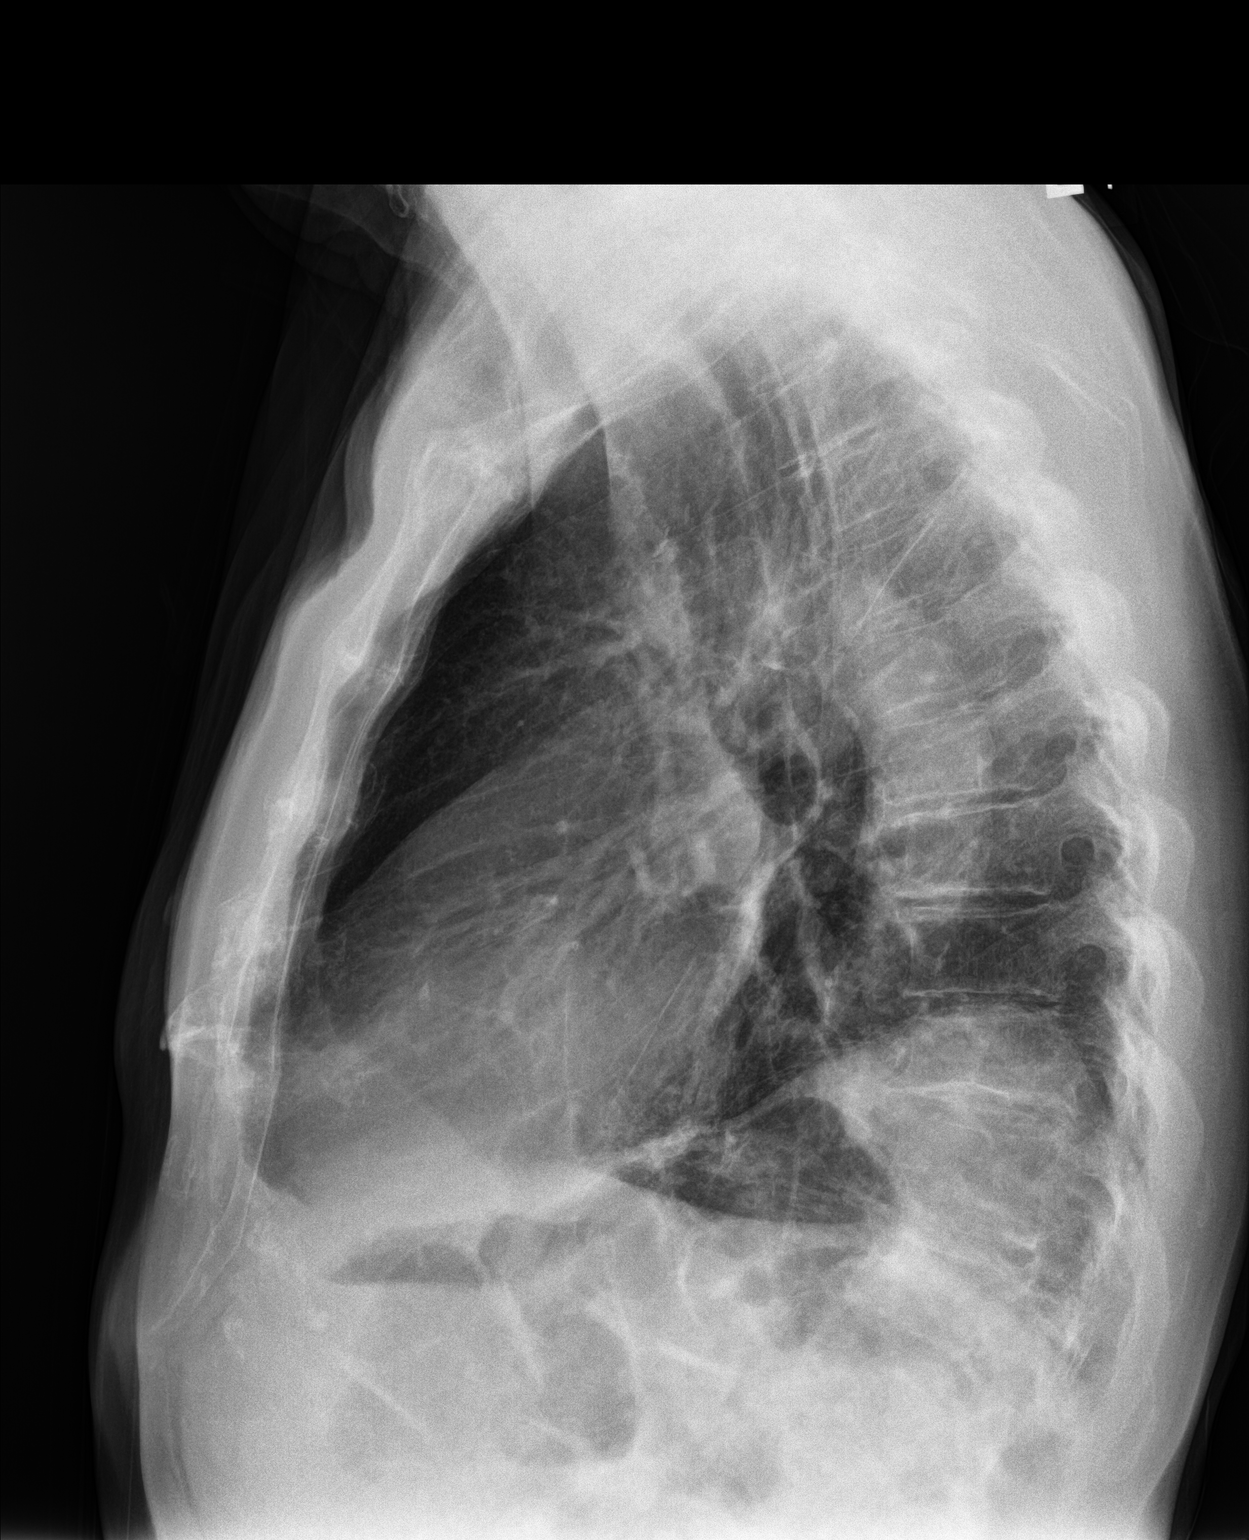

[2 of 2 positions shown; findings below may reference images not displayed]

FINDINGS: No active infiltrate or effusion is seen. Mediastinal and hilar
contours are unremarkable. The heart is unchanged in size. Healed
left rib fractures are noted. No acute bony abnormality is seen.
IMPRESSION: Stable chest x-ray.  No active lung disease.

## 2019-06-03 DIAGNOSIS — R2681 Unsteadiness on feet: Secondary | ICD-10-CM | POA: Diagnosis not present

## 2019-06-03 DIAGNOSIS — G629 Polyneuropathy, unspecified: Secondary | ICD-10-CM | POA: Diagnosis not present

## 2019-06-03 DIAGNOSIS — I4891 Unspecified atrial fibrillation: Secondary | ICD-10-CM | POA: Diagnosis not present

## 2019-06-03 DIAGNOSIS — Z87891 Personal history of nicotine dependence: Secondary | ICD-10-CM | POA: Diagnosis not present

## 2019-06-03 DIAGNOSIS — K2100 Gastro-esophageal reflux disease with esophagitis, without bleeding: Secondary | ICD-10-CM | POA: Diagnosis not present

## 2019-06-03 DIAGNOSIS — E039 Hypothyroidism, unspecified: Secondary | ICD-10-CM | POA: Diagnosis not present

## 2019-06-03 DIAGNOSIS — M810 Age-related osteoporosis without current pathological fracture: Secondary | ICD-10-CM | POA: Diagnosis not present

## 2019-06-03 DIAGNOSIS — I2510 Atherosclerotic heart disease of native coronary artery without angina pectoris: Secondary | ICD-10-CM | POA: Diagnosis not present

## 2019-06-03 DIAGNOSIS — I700 Atherosclerosis of aorta: Secondary | ICD-10-CM | POA: Diagnosis not present

## 2019-06-03 DIAGNOSIS — R413 Other amnesia: Secondary | ICD-10-CM | POA: Diagnosis not present

## 2019-06-03 DIAGNOSIS — E785 Hyperlipidemia, unspecified: Secondary | ICD-10-CM | POA: Diagnosis not present

## 2019-06-03 DIAGNOSIS — R350 Frequency of micturition: Secondary | ICD-10-CM | POA: Diagnosis not present

## 2019-06-03 DIAGNOSIS — I712 Thoracic aortic aneurysm, without rupture: Secondary | ICD-10-CM | POA: Diagnosis not present

## 2019-06-03 DIAGNOSIS — M1990 Unspecified osteoarthritis, unspecified site: Secondary | ICD-10-CM | POA: Diagnosis not present

## 2019-06-03 DIAGNOSIS — I739 Peripheral vascular disease, unspecified: Secondary | ICD-10-CM | POA: Diagnosis not present

## 2019-06-03 DIAGNOSIS — J449 Chronic obstructive pulmonary disease, unspecified: Secondary | ICD-10-CM | POA: Diagnosis not present

## 2019-06-03 DIAGNOSIS — Z9181 History of falling: Secondary | ICD-10-CM | POA: Diagnosis not present

## 2019-06-04 DIAGNOSIS — K2100 Gastro-esophageal reflux disease with esophagitis, without bleeding: Secondary | ICD-10-CM | POA: Diagnosis not present

## 2019-06-04 DIAGNOSIS — J449 Chronic obstructive pulmonary disease, unspecified: Secondary | ICD-10-CM | POA: Diagnosis not present

## 2019-06-04 DIAGNOSIS — E785 Hyperlipidemia, unspecified: Secondary | ICD-10-CM | POA: Diagnosis not present

## 2019-06-04 DIAGNOSIS — I739 Peripheral vascular disease, unspecified: Secondary | ICD-10-CM | POA: Diagnosis not present

## 2019-06-04 DIAGNOSIS — M1990 Unspecified osteoarthritis, unspecified site: Secondary | ICD-10-CM | POA: Diagnosis not present

## 2019-06-04 DIAGNOSIS — R2681 Unsteadiness on feet: Secondary | ICD-10-CM | POA: Diagnosis not present

## 2019-06-04 DIAGNOSIS — G629 Polyneuropathy, unspecified: Secondary | ICD-10-CM | POA: Diagnosis not present

## 2019-06-04 DIAGNOSIS — R413 Other amnesia: Secondary | ICD-10-CM | POA: Diagnosis not present

## 2019-06-04 DIAGNOSIS — M810 Age-related osteoporosis without current pathological fracture: Secondary | ICD-10-CM | POA: Diagnosis not present

## 2019-06-04 DIAGNOSIS — I700 Atherosclerosis of aorta: Secondary | ICD-10-CM | POA: Diagnosis not present

## 2019-06-04 DIAGNOSIS — Z87891 Personal history of nicotine dependence: Secondary | ICD-10-CM | POA: Diagnosis not present

## 2019-06-04 DIAGNOSIS — E039 Hypothyroidism, unspecified: Secondary | ICD-10-CM | POA: Diagnosis not present

## 2019-06-04 DIAGNOSIS — R350 Frequency of micturition: Secondary | ICD-10-CM | POA: Diagnosis not present

## 2019-06-04 DIAGNOSIS — Z9181 History of falling: Secondary | ICD-10-CM | POA: Diagnosis not present

## 2019-06-04 DIAGNOSIS — I4891 Unspecified atrial fibrillation: Secondary | ICD-10-CM | POA: Diagnosis not present

## 2019-06-04 DIAGNOSIS — I712 Thoracic aortic aneurysm, without rupture: Secondary | ICD-10-CM | POA: Diagnosis not present

## 2019-06-04 DIAGNOSIS — I2510 Atherosclerotic heart disease of native coronary artery without angina pectoris: Secondary | ICD-10-CM | POA: Diagnosis not present

## 2019-06-06 ENCOUNTER — Encounter: Payer: Self-pay | Admitting: Internal Medicine

## 2019-06-06 ENCOUNTER — Ambulatory Visit: Payer: Medicare Other | Admitting: Internal Medicine

## 2019-06-06 VITALS — BP 120/70 | HR 80 | Temp 98.2°F | Ht 64.0 in | Wt 160.0 lb

## 2019-06-06 DIAGNOSIS — K222 Esophageal obstruction: Secondary | ICD-10-CM

## 2019-06-06 DIAGNOSIS — K2100 Gastro-esophageal reflux disease with esophagitis, without bleeding: Secondary | ICD-10-CM | POA: Diagnosis not present

## 2019-06-06 DIAGNOSIS — K449 Diaphragmatic hernia without obstruction or gangrene: Secondary | ICD-10-CM

## 2019-06-06 DIAGNOSIS — K5909 Other constipation: Secondary | ICD-10-CM

## 2019-06-06 MED ORDER — PANTOPRAZOLE SODIUM 40 MG PO TBEC: DELAYED_RELEASE_TABLET | ORAL | 3 refills | Status: DC

## 2019-06-06 MED ORDER — SUCRALFATE 1 G PO TABS: ORAL_TABLET | ORAL | 3 refills | Status: DC

## 2019-06-06 NOTE — Patient Instructions (Addendum)
If you are age 84 or older, your body mass index should be between 23-30. Your Body mass index is 27.46 kg/m. If this is out of the aforementioned range listed, please consider follow up with your Primary Care Provider.  If you are age 59 or younger, your body mass index should be between 19-25. Your Body mass index is 27.46 kg/m. If this is out of the aformentioned range listed, please consider follow up with your Primary Care Provider.   We have sent the following medications to your pharmacy for you to pick up at your convenience:  Increase pantoprazole to 40mg  take 1 - 2 tablets daily  Take sucralfate 1g take 1 capsule 3 times daily as needed   You will follow up in our office on an as needed basis or if symptoms worsen or fail to improve.  Thank you, Dr Zenovia Jarred

## 2019-06-06 NOTE — Progress Notes (Signed)
   Subjective:    Patient ID: [name], male    DOB: [birth year], 86 y.o.   MRN: [id number]  HPI [name] is an 86 year old male with PMH of GERD with history of esophagitis, history of remote Barrett's esophagus, history of esophageal stricture, hiatal hernia with prior repair status post recurrence, chronic constipation who is here for follow-up.  He is here today with a friend.  He was last seen in the office in October 2020 by Tye Savoy, NP.  He also has a history of CAD, atrial fibrillation, a sending aortic aneurysm, COPD, hypothyroidism, prolactinoma.  He reports that for the most part he is doing well from a GI perspective.  He does occasionally have issues with reflux and lower esophageal spasm.  Reflux seems to be somewhat random but can be worse after cold liquids.  He is not having significant dysphagia.  He is using pantoprazole 40 mg at least once per day occasionally 2 times per day.  He is taking the Carafate slurry once a day but usually twice a day.  Both of these medications seem to help.  No odynophagia.  Occasional epigastric discomfort but this is rather rare.  Bowel movements have been regular with MiraLAX.  I performed his last upper endoscopy in June 2019.  There was a benign-appearing esophageal stricture at the proximal edge of a hiatal hernia dilated to 15.5 mm with balloon.  There was a 3 cm hiatal hernia present.  The entire stomach and duodenum was normal.  At that time there was no evidence of Barrett's esophagus.  Review of Systems As per HPI, otherwise negative  Current Medications, Allergies, Past Medical History, Past Surgical History, Family History and Social History were reviewed in [employer] record.     Objective:   Physical Exam BP 120/70 (BP Location: Left Arm, Patient Position: Sitting, Cuff Size: Normal)   Pulse 80   Temp 98.2 F (36.8 C)   Ht 5\' 4"  (1.626 m) Comment: height measured without shoes  Wt 160 lb  (72.6 kg)   BMI 27.46 kg/m  Gen: awake, alert, NAD HEENT: anicteric CV: RRR, no mrg Pulm: CTA b/l Abd: soft, NT/ND, +BS throughout Ext: no c/c/e Neuro: nonfocal      Assessment & Plan:  86 year old male with PMH of GERD with history of esophagitis, history of remote Barrett's esophagus, history of esophageal stricture, hiatal hernia with prior repair status post recurrence, chronic constipation who is here for follow-up.   1.  GERD/hiatal hernia/esophageal spasm --medical management is working well for him at present and there is no alarm symptom.  I discussed given how he is doing I would not repeat upper endoscopy at this time unless symptoms warrant.  I do think he should continue PPI and sucralfate. --Pantoprazole 40 mg once a day, can add a second dose in the evening if symptoms warrant --Sucralfate 1 g slurry 2-3 times a day as needed --Repeat upper endoscopy on an as-needed basis only  2.  Chronic constipation --MiraLAX working well, continue 17 g daily   30 minutes total spent today including patient facing time, coordination of care, reviewing medical history/procedures/pertinent radiology studies, and documentation of the encounter.

## 2019-06-07 DIAGNOSIS — R350 Frequency of micturition: Secondary | ICD-10-CM | POA: Diagnosis not present

## 2019-06-07 DIAGNOSIS — Z9181 History of falling: Secondary | ICD-10-CM | POA: Diagnosis not present

## 2019-06-07 DIAGNOSIS — I2510 Atherosclerotic heart disease of native coronary artery without angina pectoris: Secondary | ICD-10-CM | POA: Diagnosis not present

## 2019-06-07 DIAGNOSIS — M810 Age-related osteoporosis without current pathological fracture: Secondary | ICD-10-CM | POA: Diagnosis not present

## 2019-06-07 DIAGNOSIS — I712 Thoracic aortic aneurysm, without rupture: Secondary | ICD-10-CM | POA: Diagnosis not present

## 2019-06-07 DIAGNOSIS — E785 Hyperlipidemia, unspecified: Secondary | ICD-10-CM | POA: Diagnosis not present

## 2019-06-07 DIAGNOSIS — Z87891 Personal history of nicotine dependence: Secondary | ICD-10-CM | POA: Diagnosis not present

## 2019-06-07 DIAGNOSIS — I4891 Unspecified atrial fibrillation: Secondary | ICD-10-CM | POA: Diagnosis not present

## 2019-06-07 DIAGNOSIS — I739 Peripheral vascular disease, unspecified: Secondary | ICD-10-CM | POA: Diagnosis not present

## 2019-06-07 DIAGNOSIS — J449 Chronic obstructive pulmonary disease, unspecified: Secondary | ICD-10-CM | POA: Diagnosis not present

## 2019-06-07 DIAGNOSIS — I700 Atherosclerosis of aorta: Secondary | ICD-10-CM | POA: Diagnosis not present

## 2019-06-07 DIAGNOSIS — R2681 Unsteadiness on feet: Secondary | ICD-10-CM | POA: Diagnosis not present

## 2019-06-07 DIAGNOSIS — E039 Hypothyroidism, unspecified: Secondary | ICD-10-CM | POA: Diagnosis not present

## 2019-06-07 DIAGNOSIS — G629 Polyneuropathy, unspecified: Secondary | ICD-10-CM | POA: Diagnosis not present

## 2019-06-07 DIAGNOSIS — R413 Other amnesia: Secondary | ICD-10-CM | POA: Diagnosis not present

## 2019-06-07 DIAGNOSIS — K2100 Gastro-esophageal reflux disease with esophagitis, without bleeding: Secondary | ICD-10-CM | POA: Diagnosis not present

## 2019-06-07 DIAGNOSIS — M1990 Unspecified osteoarthritis, unspecified site: Secondary | ICD-10-CM | POA: Diagnosis not present

## 2019-06-10 ENCOUNTER — Other Ambulatory Visit: Payer: Self-pay

## 2019-06-10 ENCOUNTER — Ambulatory Visit (INDEPENDENT_AMBULATORY_CARE_PROVIDER_SITE_OTHER): Payer: Medicare Other

## 2019-06-10 DIAGNOSIS — R350 Frequency of micturition: Secondary | ICD-10-CM | POA: Diagnosis not present

## 2019-06-10 DIAGNOSIS — I700 Atherosclerosis of aorta: Secondary | ICD-10-CM | POA: Diagnosis not present

## 2019-06-10 DIAGNOSIS — I2510 Atherosclerotic heart disease of native coronary artery without angina pectoris: Secondary | ICD-10-CM | POA: Diagnosis not present

## 2019-06-10 DIAGNOSIS — E785 Hyperlipidemia, unspecified: Secondary | ICD-10-CM | POA: Diagnosis not present

## 2019-06-10 DIAGNOSIS — J449 Chronic obstructive pulmonary disease, unspecified: Secondary | ICD-10-CM | POA: Diagnosis not present

## 2019-06-10 DIAGNOSIS — I739 Peripheral vascular disease, unspecified: Secondary | ICD-10-CM

## 2019-06-10 DIAGNOSIS — K2100 Gastro-esophageal reflux disease with esophagitis, without bleeding: Secondary | ICD-10-CM | POA: Diagnosis not present

## 2019-06-10 DIAGNOSIS — Z9181 History of falling: Secondary | ICD-10-CM

## 2019-06-10 DIAGNOSIS — I712 Thoracic aortic aneurysm, without rupture: Secondary | ICD-10-CM

## 2019-06-10 DIAGNOSIS — M810 Age-related osteoporosis without current pathological fracture: Secondary | ICD-10-CM

## 2019-06-10 DIAGNOSIS — R2681 Unsteadiness on feet: Secondary | ICD-10-CM | POA: Diagnosis not present

## 2019-06-10 DIAGNOSIS — E039 Hypothyroidism, unspecified: Secondary | ICD-10-CM

## 2019-06-10 DIAGNOSIS — N401 Enlarged prostate with lower urinary tract symptoms: Secondary | ICD-10-CM

## 2019-06-10 DIAGNOSIS — Z87891 Personal history of nicotine dependence: Secondary | ICD-10-CM

## 2019-06-10 DIAGNOSIS — R413 Other amnesia: Secondary | ICD-10-CM

## 2019-06-10 DIAGNOSIS — M1990 Unspecified osteoarthritis, unspecified site: Secondary | ICD-10-CM | POA: Diagnosis not present

## 2019-06-10 DIAGNOSIS — I4891 Unspecified atrial fibrillation: Secondary | ICD-10-CM

## 2019-06-10 DIAGNOSIS — G629 Polyneuropathy, unspecified: Secondary | ICD-10-CM | POA: Diagnosis not present

## 2019-06-11 ENCOUNTER — Telehealth: Payer: Self-pay | Admitting: Family Medicine

## 2019-06-11 DIAGNOSIS — E039 Hypothyroidism, unspecified: Secondary | ICD-10-CM | POA: Diagnosis not present

## 2019-06-11 DIAGNOSIS — R413 Other amnesia: Secondary | ICD-10-CM | POA: Diagnosis not present

## 2019-06-11 DIAGNOSIS — M810 Age-related osteoporosis without current pathological fracture: Secondary | ICD-10-CM | POA: Diagnosis not present

## 2019-06-11 DIAGNOSIS — K2100 Gastro-esophageal reflux disease with esophagitis, without bleeding: Secondary | ICD-10-CM | POA: Diagnosis not present

## 2019-06-11 DIAGNOSIS — R350 Frequency of micturition: Secondary | ICD-10-CM | POA: Diagnosis not present

## 2019-06-11 DIAGNOSIS — I4891 Unspecified atrial fibrillation: Secondary | ICD-10-CM | POA: Diagnosis not present

## 2019-06-11 DIAGNOSIS — I739 Peripheral vascular disease, unspecified: Secondary | ICD-10-CM | POA: Diagnosis not present

## 2019-06-11 DIAGNOSIS — M1990 Unspecified osteoarthritis, unspecified site: Secondary | ICD-10-CM | POA: Diagnosis not present

## 2019-06-11 DIAGNOSIS — Z87891 Personal history of nicotine dependence: Secondary | ICD-10-CM | POA: Diagnosis not present

## 2019-06-11 DIAGNOSIS — I700 Atherosclerosis of aorta: Secondary | ICD-10-CM | POA: Diagnosis not present

## 2019-06-11 DIAGNOSIS — R2681 Unsteadiness on feet: Secondary | ICD-10-CM | POA: Diagnosis not present

## 2019-06-11 DIAGNOSIS — I2510 Atherosclerotic heart disease of native coronary artery without angina pectoris: Secondary | ICD-10-CM | POA: Diagnosis not present

## 2019-06-11 DIAGNOSIS — J449 Chronic obstructive pulmonary disease, unspecified: Secondary | ICD-10-CM | POA: Diagnosis not present

## 2019-06-11 DIAGNOSIS — Z9181 History of falling: Secondary | ICD-10-CM | POA: Diagnosis not present

## 2019-06-11 DIAGNOSIS — G629 Polyneuropathy, unspecified: Secondary | ICD-10-CM | POA: Diagnosis not present

## 2019-06-11 DIAGNOSIS — E785 Hyperlipidemia, unspecified: Secondary | ICD-10-CM | POA: Diagnosis not present

## 2019-06-11 DIAGNOSIS — I712 Thoracic aortic aneurysm, without rupture: Secondary | ICD-10-CM | POA: Diagnosis not present

## 2019-06-11 NOTE — Telephone Encounter (Signed)
LMOVM given VO to add OT

## 2019-06-13 DIAGNOSIS — I4891 Unspecified atrial fibrillation: Secondary | ICD-10-CM | POA: Diagnosis not present

## 2019-06-13 DIAGNOSIS — I739 Peripheral vascular disease, unspecified: Secondary | ICD-10-CM | POA: Diagnosis not present

## 2019-06-13 DIAGNOSIS — G629 Polyneuropathy, unspecified: Secondary | ICD-10-CM | POA: Diagnosis not present

## 2019-06-13 DIAGNOSIS — E039 Hypothyroidism, unspecified: Secondary | ICD-10-CM | POA: Diagnosis not present

## 2019-06-13 DIAGNOSIS — I2510 Atherosclerotic heart disease of native coronary artery without angina pectoris: Secondary | ICD-10-CM | POA: Diagnosis not present

## 2019-06-13 DIAGNOSIS — J449 Chronic obstructive pulmonary disease, unspecified: Secondary | ICD-10-CM | POA: Diagnosis not present

## 2019-06-13 DIAGNOSIS — M810 Age-related osteoporosis without current pathological fracture: Secondary | ICD-10-CM | POA: Diagnosis not present

## 2019-06-13 DIAGNOSIS — R413 Other amnesia: Secondary | ICD-10-CM | POA: Diagnosis not present

## 2019-06-13 DIAGNOSIS — R350 Frequency of micturition: Secondary | ICD-10-CM | POA: Diagnosis not present

## 2019-06-13 DIAGNOSIS — K2100 Gastro-esophageal reflux disease with esophagitis, without bleeding: Secondary | ICD-10-CM | POA: Diagnosis not present

## 2019-06-13 DIAGNOSIS — I700 Atherosclerosis of aorta: Secondary | ICD-10-CM | POA: Diagnosis not present

## 2019-06-13 DIAGNOSIS — I712 Thoracic aortic aneurysm, without rupture: Secondary | ICD-10-CM | POA: Diagnosis not present

## 2019-06-13 DIAGNOSIS — R2681 Unsteadiness on feet: Secondary | ICD-10-CM | POA: Diagnosis not present

## 2019-06-13 DIAGNOSIS — Z9181 History of falling: Secondary | ICD-10-CM | POA: Diagnosis not present

## 2019-06-13 DIAGNOSIS — M1990 Unspecified osteoarthritis, unspecified site: Secondary | ICD-10-CM | POA: Diagnosis not present

## 2019-06-13 DIAGNOSIS — Z87891 Personal history of nicotine dependence: Secondary | ICD-10-CM | POA: Diagnosis not present

## 2019-06-13 DIAGNOSIS — E785 Hyperlipidemia, unspecified: Secondary | ICD-10-CM | POA: Diagnosis not present

## 2019-06-19 DIAGNOSIS — E785 Hyperlipidemia, unspecified: Secondary | ICD-10-CM | POA: Diagnosis not present

## 2019-06-19 DIAGNOSIS — I4891 Unspecified atrial fibrillation: Secondary | ICD-10-CM | POA: Diagnosis not present

## 2019-06-19 DIAGNOSIS — R2681 Unsteadiness on feet: Secondary | ICD-10-CM | POA: Diagnosis not present

## 2019-06-19 DIAGNOSIS — R350 Frequency of micturition: Secondary | ICD-10-CM | POA: Diagnosis not present

## 2019-06-19 DIAGNOSIS — M1990 Unspecified osteoarthritis, unspecified site: Secondary | ICD-10-CM | POA: Diagnosis not present

## 2019-06-19 DIAGNOSIS — I739 Peripheral vascular disease, unspecified: Secondary | ICD-10-CM | POA: Diagnosis not present

## 2019-06-19 DIAGNOSIS — I2510 Atherosclerotic heart disease of native coronary artery without angina pectoris: Secondary | ICD-10-CM | POA: Diagnosis not present

## 2019-06-19 DIAGNOSIS — M810 Age-related osteoporosis without current pathological fracture: Secondary | ICD-10-CM | POA: Diagnosis not present

## 2019-06-19 DIAGNOSIS — G629 Polyneuropathy, unspecified: Secondary | ICD-10-CM | POA: Diagnosis not present

## 2019-06-19 DIAGNOSIS — J449 Chronic obstructive pulmonary disease, unspecified: Secondary | ICD-10-CM | POA: Diagnosis not present

## 2019-06-19 DIAGNOSIS — Z87891 Personal history of nicotine dependence: Secondary | ICD-10-CM | POA: Diagnosis not present

## 2019-06-19 DIAGNOSIS — E039 Hypothyroidism, unspecified: Secondary | ICD-10-CM | POA: Diagnosis not present

## 2019-06-19 DIAGNOSIS — R413 Other amnesia: Secondary | ICD-10-CM | POA: Diagnosis not present

## 2019-06-19 DIAGNOSIS — I700 Atherosclerosis of aorta: Secondary | ICD-10-CM | POA: Diagnosis not present

## 2019-06-19 DIAGNOSIS — Z9181 History of falling: Secondary | ICD-10-CM | POA: Diagnosis not present

## 2019-06-19 DIAGNOSIS — K2100 Gastro-esophageal reflux disease with esophagitis, without bleeding: Secondary | ICD-10-CM | POA: Diagnosis not present

## 2019-06-19 DIAGNOSIS — I712 Thoracic aortic aneurysm, without rupture: Secondary | ICD-10-CM | POA: Diagnosis not present

## 2019-06-24 DIAGNOSIS — Z87891 Personal history of nicotine dependence: Secondary | ICD-10-CM | POA: Diagnosis not present

## 2019-06-24 DIAGNOSIS — R350 Frequency of micturition: Secondary | ICD-10-CM | POA: Diagnosis not present

## 2019-06-24 DIAGNOSIS — I739 Peripheral vascular disease, unspecified: Secondary | ICD-10-CM | POA: Diagnosis not present

## 2019-06-24 DIAGNOSIS — E039 Hypothyroidism, unspecified: Secondary | ICD-10-CM | POA: Diagnosis not present

## 2019-06-24 DIAGNOSIS — I2510 Atherosclerotic heart disease of native coronary artery without angina pectoris: Secondary | ICD-10-CM | POA: Diagnosis not present

## 2019-06-24 DIAGNOSIS — I700 Atherosclerosis of aorta: Secondary | ICD-10-CM | POA: Diagnosis not present

## 2019-06-24 DIAGNOSIS — R413 Other amnesia: Secondary | ICD-10-CM | POA: Diagnosis not present

## 2019-06-24 DIAGNOSIS — M810 Age-related osteoporosis without current pathological fracture: Secondary | ICD-10-CM | POA: Diagnosis not present

## 2019-06-24 DIAGNOSIS — G629 Polyneuropathy, unspecified: Secondary | ICD-10-CM | POA: Diagnosis not present

## 2019-06-24 DIAGNOSIS — M1990 Unspecified osteoarthritis, unspecified site: Secondary | ICD-10-CM | POA: Diagnosis not present

## 2019-06-24 DIAGNOSIS — I712 Thoracic aortic aneurysm, without rupture: Secondary | ICD-10-CM | POA: Diagnosis not present

## 2019-06-24 DIAGNOSIS — E785 Hyperlipidemia, unspecified: Secondary | ICD-10-CM | POA: Diagnosis not present

## 2019-06-24 DIAGNOSIS — R2681 Unsteadiness on feet: Secondary | ICD-10-CM | POA: Diagnosis not present

## 2019-06-24 DIAGNOSIS — K2100 Gastro-esophageal reflux disease with esophagitis, without bleeding: Secondary | ICD-10-CM | POA: Diagnosis not present

## 2019-06-24 DIAGNOSIS — J449 Chronic obstructive pulmonary disease, unspecified: Secondary | ICD-10-CM | POA: Diagnosis not present

## 2019-06-24 DIAGNOSIS — Z9181 History of falling: Secondary | ICD-10-CM | POA: Diagnosis not present

## 2019-06-24 DIAGNOSIS — I4891 Unspecified atrial fibrillation: Secondary | ICD-10-CM | POA: Diagnosis not present

## 2019-06-24 NOTE — Telephone Encounter (Signed)
Dr. Wert, please see pt's mychart message and advise. 

## 2019-06-25 DIAGNOSIS — R2681 Unsteadiness on feet: Secondary | ICD-10-CM | POA: Diagnosis not present

## 2019-06-25 DIAGNOSIS — E039 Hypothyroidism, unspecified: Secondary | ICD-10-CM | POA: Diagnosis not present

## 2019-06-25 DIAGNOSIS — J449 Chronic obstructive pulmonary disease, unspecified: Secondary | ICD-10-CM | POA: Diagnosis not present

## 2019-06-25 DIAGNOSIS — I4891 Unspecified atrial fibrillation: Secondary | ICD-10-CM | POA: Diagnosis not present

## 2019-06-25 DIAGNOSIS — Z87891 Personal history of nicotine dependence: Secondary | ICD-10-CM | POA: Diagnosis not present

## 2019-06-25 DIAGNOSIS — R413 Other amnesia: Secondary | ICD-10-CM | POA: Diagnosis not present

## 2019-06-25 DIAGNOSIS — M810 Age-related osteoporosis without current pathological fracture: Secondary | ICD-10-CM | POA: Diagnosis not present

## 2019-06-25 DIAGNOSIS — R350 Frequency of micturition: Secondary | ICD-10-CM | POA: Diagnosis not present

## 2019-06-25 DIAGNOSIS — Z9181 History of falling: Secondary | ICD-10-CM | POA: Diagnosis not present

## 2019-06-25 DIAGNOSIS — G629 Polyneuropathy, unspecified: Secondary | ICD-10-CM | POA: Diagnosis not present

## 2019-06-25 DIAGNOSIS — E785 Hyperlipidemia, unspecified: Secondary | ICD-10-CM | POA: Diagnosis not present

## 2019-06-25 DIAGNOSIS — I2510 Atherosclerotic heart disease of native coronary artery without angina pectoris: Secondary | ICD-10-CM | POA: Diagnosis not present

## 2019-06-25 DIAGNOSIS — I712 Thoracic aortic aneurysm, without rupture: Secondary | ICD-10-CM | POA: Diagnosis not present

## 2019-06-25 DIAGNOSIS — I700 Atherosclerosis of aorta: Secondary | ICD-10-CM | POA: Diagnosis not present

## 2019-06-25 DIAGNOSIS — I739 Peripheral vascular disease, unspecified: Secondary | ICD-10-CM | POA: Diagnosis not present

## 2019-06-25 DIAGNOSIS — M1990 Unspecified osteoarthritis, unspecified site: Secondary | ICD-10-CM | POA: Diagnosis not present

## 2019-06-25 DIAGNOSIS — K2100 Gastro-esophageal reflux disease with esophagitis, without bleeding: Secondary | ICD-10-CM | POA: Diagnosis not present

## 2019-06-25 MED ORDER — CIPROFLOXACIN HCL 500 MG PO TABS: 500.0000 mg | ORAL_TABLET | Freq: Two times a day (BID) | ORAL | 5 refills | Status: AC

## 2019-06-25 NOTE — Telephone Encounter (Signed)
Can you please clarify dosage and instructions? Thank you.

## 2019-06-25 NOTE — Telephone Encounter (Signed)
Cipro to 500 mg twice daily x 10 day should have been refillable and still good thru 07/2018  But ok to refill x 5 more

## 2019-06-25 NOTE — Telephone Encounter (Signed)
Ok to take a course, ok to give refill x 5 if needs it  Set him up for f/u in [place] instead of [place] office next available

## 2019-07-04 DIAGNOSIS — Z87891 Personal history of nicotine dependence: Secondary | ICD-10-CM | POA: Diagnosis not present

## 2019-07-04 DIAGNOSIS — J449 Chronic obstructive pulmonary disease, unspecified: Secondary | ICD-10-CM | POA: Diagnosis not present

## 2019-07-04 DIAGNOSIS — M810 Age-related osteoporosis without current pathological fracture: Secondary | ICD-10-CM | POA: Diagnosis not present

## 2019-07-04 DIAGNOSIS — E039 Hypothyroidism, unspecified: Secondary | ICD-10-CM | POA: Diagnosis not present

## 2019-07-04 DIAGNOSIS — Z9181 History of falling: Secondary | ICD-10-CM | POA: Diagnosis not present

## 2019-07-04 DIAGNOSIS — I4891 Unspecified atrial fibrillation: Secondary | ICD-10-CM | POA: Diagnosis not present

## 2019-07-04 DIAGNOSIS — I700 Atherosclerosis of aorta: Secondary | ICD-10-CM | POA: Diagnosis not present

## 2019-07-04 DIAGNOSIS — G629 Polyneuropathy, unspecified: Secondary | ICD-10-CM | POA: Diagnosis not present

## 2019-07-04 DIAGNOSIS — I739 Peripheral vascular disease, unspecified: Secondary | ICD-10-CM | POA: Diagnosis not present

## 2019-07-04 DIAGNOSIS — R350 Frequency of micturition: Secondary | ICD-10-CM | POA: Diagnosis not present

## 2019-07-04 DIAGNOSIS — R413 Other amnesia: Secondary | ICD-10-CM | POA: Diagnosis not present

## 2019-07-04 DIAGNOSIS — K2100 Gastro-esophageal reflux disease with esophagitis, without bleeding: Secondary | ICD-10-CM | POA: Diagnosis not present

## 2019-07-04 DIAGNOSIS — E785 Hyperlipidemia, unspecified: Secondary | ICD-10-CM | POA: Diagnosis not present

## 2019-07-04 DIAGNOSIS — I2510 Atherosclerotic heart disease of native coronary artery without angina pectoris: Secondary | ICD-10-CM | POA: Diagnosis not present

## 2019-07-04 DIAGNOSIS — R2681 Unsteadiness on feet: Secondary | ICD-10-CM | POA: Diagnosis not present

## 2019-07-04 DIAGNOSIS — M1990 Unspecified osteoarthritis, unspecified site: Secondary | ICD-10-CM | POA: Diagnosis not present

## 2019-07-04 DIAGNOSIS — I712 Thoracic aortic aneurysm, without rupture: Secondary | ICD-10-CM | POA: Diagnosis not present

## 2019-07-05 ENCOUNTER — Other Ambulatory Visit: Payer: Self-pay

## 2019-07-08 DIAGNOSIS — J449 Chronic obstructive pulmonary disease, unspecified: Secondary | ICD-10-CM | POA: Diagnosis not present

## 2019-07-08 DIAGNOSIS — R413 Other amnesia: Secondary | ICD-10-CM | POA: Diagnosis not present

## 2019-07-08 DIAGNOSIS — I4891 Unspecified atrial fibrillation: Secondary | ICD-10-CM | POA: Diagnosis not present

## 2019-07-08 DIAGNOSIS — I700 Atherosclerosis of aorta: Secondary | ICD-10-CM | POA: Diagnosis not present

## 2019-07-08 DIAGNOSIS — Z87891 Personal history of nicotine dependence: Secondary | ICD-10-CM | POA: Diagnosis not present

## 2019-07-08 DIAGNOSIS — Z9181 History of falling: Secondary | ICD-10-CM | POA: Diagnosis not present

## 2019-07-08 DIAGNOSIS — M810 Age-related osteoporosis without current pathological fracture: Secondary | ICD-10-CM | POA: Diagnosis not present

## 2019-07-08 DIAGNOSIS — E039 Hypothyroidism, unspecified: Secondary | ICD-10-CM | POA: Diagnosis not present

## 2019-07-08 DIAGNOSIS — I739 Peripheral vascular disease, unspecified: Secondary | ICD-10-CM | POA: Diagnosis not present

## 2019-07-08 DIAGNOSIS — G629 Polyneuropathy, unspecified: Secondary | ICD-10-CM | POA: Diagnosis not present

## 2019-07-08 DIAGNOSIS — I2510 Atherosclerotic heart disease of native coronary artery without angina pectoris: Secondary | ICD-10-CM | POA: Diagnosis not present

## 2019-07-08 DIAGNOSIS — K2100 Gastro-esophageal reflux disease with esophagitis, without bleeding: Secondary | ICD-10-CM | POA: Diagnosis not present

## 2019-07-08 DIAGNOSIS — R350 Frequency of micturition: Secondary | ICD-10-CM | POA: Diagnosis not present

## 2019-07-08 DIAGNOSIS — I712 Thoracic aortic aneurysm, without rupture: Secondary | ICD-10-CM | POA: Diagnosis not present

## 2019-07-08 DIAGNOSIS — M1990 Unspecified osteoarthritis, unspecified site: Secondary | ICD-10-CM | POA: Diagnosis not present

## 2019-07-08 DIAGNOSIS — E785 Hyperlipidemia, unspecified: Secondary | ICD-10-CM | POA: Diagnosis not present

## 2019-07-08 DIAGNOSIS — R2681 Unsteadiness on feet: Secondary | ICD-10-CM | POA: Diagnosis not present

## 2019-07-09 ENCOUNTER — Other Ambulatory Visit: Payer: Self-pay

## 2019-07-09 ENCOUNTER — Ambulatory Visit: Payer: Medicare Other | Admitting: Endocrinology

## 2019-07-09 ENCOUNTER — Encounter: Payer: Self-pay | Admitting: Endocrinology

## 2019-07-09 VITALS — BP 112/70 | HR 81 | Ht 64.0 in | Wt 160.0 lb

## 2019-07-09 DIAGNOSIS — E221 Hyperprolactinemia: Secondary | ICD-10-CM

## 2019-07-09 DIAGNOSIS — Z7189 Other specified counseling: Secondary | ICD-10-CM | POA: Insufficient documentation

## 2019-07-09 NOTE — Progress Notes (Signed)
Cardiology Office Note   Date:  07/10/2019   ID:  [name], DOB [birth year], MRN [id number]  PCP:  Dettinger, [name], MD  Cardiologist:   Minus Breeding, MD   Chief Complaint  Patient presents with  . Palpitations      History of Present Illness: [name] is a 86 y.o. male who presents for who presents for followup of his known coronary disease. He had a negative stress perfusion study in 2017.   He wore a Holter.  There were PVCs, non sustained V tach and atrial tach and PVCs/PACs.   Echo at that time was essentially unremarkable.  He had episodes of light headedness.  He had a transient low BP and had a borderline saturation briefly.  I sent him for an EKG which was unremarkable.  He was not anemic.  He returns for follow up.    Since I last saw him he has had no new hospitalizations.  He gets around slowly.  He has had some falls.  He fell 2 times in the last month and once when he was outside hauling some yard waste.  He has been having home physical therapy and nursing come to the house.  He lives alone since his wife died 4 years ago.  He does have a "lady friend."  He does get some puzzles for his mind.  He occasionally has a "funny feeling" in his chest but it is all right palpitation or maybe mild discomfort.  He does not think that this is any different than it was previously like back in 2017 when he had stress perfusion testing.  He has not had taken nitroglycerin.  He does not have any new shortness of breath, PND or orthopnea.  He has no new palpitations, presyncope or syncope.  He has not had any weight gain or edema.  He does have pituitary adenoma that is being followed.  He also has an ascending aorta is now 4.4 cm and stable and being followed.     Past Medical History:  Diagnosis Date  . Abnormality of gait 06/07/2013  . Anemia   . Aneurysm ([place])    on assending aorta, currently watching it, Dr Cyndia Bent  . Anxiety   . Arthritis   . Asthma   .  Atrial fibrillation ([place])   . Barrett's esophagus   . CAD (coronary artery disease) 03/2006   3.0 x 20 mm TAXUS Perseus DES to the LAD; 01/2007  L main 30%, oLAD 50%, pLAD stent ok, CFX 80%, OM 60%, pRCA 60%, mRCA 70%, oPDA 90%; med rx   . Cancer ([place])    skin cancer   . Cataract    bilateral removal of cateracts  . Complication of anesthesia     no issues,but pt prefers spinal due to Pulmonary problems  . COPD (chronic obstructive pulmonary disease) ([place])    oxygen  on standby in home.  . Depression   . Diverticulosis   . Emphysema   . Enlarged prostate with urinary retention   . Esophageal stenosis   . GERD (gastroesophageal reflux disease)   . Hiatal hernia   . Hypothyroidism   . Neuropathy   . Osteoporosis   . Pituitary macroadenoma ([place])   . Restless legs syndrome (RLS)   . UGIB (upper gastrointestinal bleed) 03/2013   EGD w/ large ulcer at GE junction    Past Surgical History:  Procedure Laterality Date  . ABDOMINAL HERNIA REPAIR   2008  .  CARDIAC CATHETERIZATION  01/2007   L main 30%, oLAD 50%,  pLAD stent ok, CFX 80%, OM 60%, pRCA 60%, mRCA 70%, oPDA 90%; med rx  . cataract extraction both eyes    . COLONOSCOPY    . CORONARY STENT PLACEMENT  03/2006   3.0 x 20 mm TAXUS Perseus DES to the LAD  . CYSTOSCOPY N/A 06/10/2015   Procedure: CYSTOSCOPY FULGRATION OF BLEEDING,  electovapor resection;  Surgeon: Irine Seal, MD;  Location: [facility];  Service: Urology;  Laterality: N/A;  . CYSTOSCOPY WITH INSERTION OF UROLIFT N/A 06/01/2015   Procedure: CYSTOSCOPY WITH INSERTION OF UROLIFT x4;  Surgeon: Irine Seal, MD;  Location: [facility];  Service: Urology;  Laterality: N/A;  . ESOPHAGOGASTRODUODENOSCOPY N/A 04/20/2013   Procedure: ESOPHAGOGASTRODUODENOSCOPY (EGD);  Surgeon: Inda Castle, MD;  Location: [facility];  Service: Endoscopy;  Laterality: N/A;  . ESOPHAGOGASTRODUODENOSCOPY N/A 03/25/2016   Procedure: ESOPHAGOGASTRODUODENOSCOPY (EGD);  Surgeon: Irene Shipper, MD;  Location:  [facility];  Service: Endoscopy;  Laterality: N/A;  . ESOPHAGOGASTRODUODENOSCOPY (EGD) WITH PROPOFOL N/A 10/13/2015   Procedure: ESOPHAGOGASTRODUODENOSCOPY (EGD) WITH PROPOFOL;  Surgeon: Jerene Bears, MD;  Location: [facility];  Service: Gastroenterology;  Laterality: N/A;  . FOOT SURGERY  1994 left, 2002 right foot   bilateral foot reconstruciton  . FOOT SURGERY     reconstruction of both feet- no retained hardware.  Marland Kitchen HIATAL HERNIA REPAIR  01-04-2008  . MELANOMA EXCISION  2019  . POLYPECTOMY    . PROSTATE SURGERY     x 2  . SAVORY DILATION N/A 03/25/2016   Procedure: SAVORY DILATION;  Surgeon: Irene Shipper, MD;  [facility];  Service: Endoscopy;  Laterality: N/A;  . TOTAL HIP ARTHROPLASTY  07/21/2011   Procedure: TOTAL HIP ARTHROPLASTY ANTERIOR APPROACH;  Surgeon: Mauri Pole, MD;  Location: [facility];  Service: Orthopedics;  Laterality: Left;  . TOTAL HIP ARTHROPLASTY Right 09/07/2012   Procedure: RIGHT TOTAL HIP ARTHROPLASTY ANTERIOR APPROACH;  Surgeon: Mcarthur Rossetti, MD;  Location: [facility];  Service: Orthopedics;  Laterality: Right;  . TRANSURETHRAL RESECTION OF BLADDER NECK N/A 11/04/2015   Procedure: RESECTION OF BLADDER NECK;  Surgeon: Cleon Gustin, MD;  Location: [facility];  Service: Urology;  Laterality: N/A;  . TRANSURETHRAL RESECTION OF PROSTATE N/A 11/04/2015   Procedure: TRANSURETHRAL RESECTION OF THE PROSTATE (TURP); REMOVAL OF UROLIFT IMPLANTS X THREE;  Surgeon: Cleon Gustin, MD;  Location: [facility];  Service: Urology;  Laterality: N/A;  . UPPER GASTROINTESTINAL ENDOSCOPY  12/2013   Dr Hilarie Fredrickson, gastritis  . VIDEO BRONCHOSCOPY Bilateral 02/01/2017   Procedure: VIDEO BRONCHOSCOPY WITHOUT FLUORO;  Surgeon: Tanda Rockers, MD;  Location: [facility];  Service: Cardiopulmonary;  Laterality: Bilateral;     Current Outpatient Medications  Medication Sig Dispense Refill  . albuterol (PROVENTIL HFA;VENTOLIN HFA) 108 (90 Base) MCG/ACT inhaler Inhale 2 puffs  into the lungs every 6 (six) hours as needed for wheezing or shortness of breath. 18 g 2  . bisacodyl (DULCOLAX) 10 MG suppository Place 10 mg rectally as needed for moderate constipation.    . bromocriptine (PARLODEL) 2.5 MG tablet Take 1 tablet (2.5 mg total) by mouth 2 (two) times daily. 60 tablet 2  . busPIRone (BUSPAR) 15 MG tablet Take 1 tablet (15 mg total) by mouth 2 (two) times daily as needed. (Patient taking differently: Take 7.5 mg by mouth daily. ) 60 tablet 0  . calcium carbonate (OSCAL) 1500 (600 Ca) MG TABS tablet Take 600 mg of  elemental calcium by mouth daily with breakfast.     . cholecalciferol (VITAMIN D) 1000 UNITS tablet Take 1,000 Units by mouth daily.     . ferrous sulfate 325 (65 FE) MG tablet Take 1 tablet (325 mg total) by mouth daily with breakfast. 90 tablet 3  . GAVILAX 17 GM/SCOOP powder DISSOLVE 17 GM SCOOP IN 8 OZ. OF WATER AND DRINK ONCE DAILY AS NEEDED FOR CONSTIPATION 510 g 0  . guaiFENesin (MUCINEX) 600 MG 12 hr tablet Take 600 mg by mouth daily.     Marland Kitchen levalbuterol (XOPENEX) 0.63 MG/3ML nebulizer solution Take 3 mLs (0.63 mg total) by nebulization every 6 (six) hours as needed for wheezing or shortness of breath. 72 mL 1  . levothyroxine (SYNTHROID) 75 MCG tablet Take 1 tablet (75 mcg total) by mouth daily. 90 tablet 3  . MAGNESIUM CITRATE PO Take by mouth daily.    . montelukast (SINGULAIR) 10 MG tablet     . naphazoline-pheniramine (NAPHCON-A) 0.025-0.3 % ophthalmic solution Place 1 drop into both eyes 2 (two) times daily as needed for irritation or allergies.     . nitroGLYCERIN (NITROSTAT) 0.4 MG SL tablet Place 1 tablet (0.4 mg total) under the tongue every 5 (five) minutes as needed for chest pain. 25 tablet 2  . pantoprazole (PROTONIX) 40 MG tablet 1 - 2 tablets daily 180 tablet 3  . sucralfate (CARAFATE) 1 g tablet Take 1 tablet three times daily as needed 270 tablet 3  . SYMBICORT 160-4.5 MCG/ACT inhaler INHALE 2 PUFFS 2 TIMES A DAY 10.2 g 5  .  tamsulosin (FLOMAX) 0.4 MG CAPS capsule Take 1 capsule (0.4 mg total) by mouth daily. 30 capsule 3  . tobramycin-dexamethasone (TOBRADEX) ophthalmic solution Apply 1 drop in affected eye(s) every 2 hours for two days. Then every 4 hours for 5 days. 5 mL 0  . triamcinolone cream (KENALOG) 0.1 % Apply 1 application topically 2 (two) times daily. 30 g 0  . vitamin B-12 (CYANOCOBALAMIN) 1000 MCG tablet Take 1,000 mcg by mouth daily.    . vitamin E (VITAMIN E) 400 UNIT capsule Take 400 Units by mouth daily.    Marland Kitchen Respiratory Therapy Supplies (FLUTTER) DEVI Twice a day and prn as needed, may increase if feeling worse 1 each 0   No current facility-administered medications for this visit.    Allergies:   Betadine [povidone iodine], Gadavist [gadobutrol], Lortab [hydrocodone-acetaminophen], Penicillins, Zetia [ezetimibe], and Zocor [simvastatin]    ROS:  Please see the history of present illness.   Otherwise, review of systems are positive for none.   All other systems are reviewed and negative.    PHYSICAL EXAM: VS:  BP 118/70   Pulse 76   Ht 5\' 5"  (1.651 m)   Wt 158 lb (71.7 kg)   BMI 26.29 kg/m  , BMI Body mass index is 26.29 kg/m. GENERAL: Mildly frail-appearing appearing NECK:  No jugular venous distention, waveform within normal limits, carotid upstroke brisk and symmetric, right carotid bruits, no thyromegaly LUNGS:  Clear to auscultation bilaterally CHEST:  Unremarkable HEART:  PMI not displaced or sustained,S1 and S2 within normal limits, no S3, no S4, no clicks, no rubs, no murmurs ABD:  Flat, positive bowel sounds normal in frequency in pitch, no bruits, no rebound, no guarding, no midline pulsatile mass, no hepatomegaly, no splenomegaly EXT:  2 plus pulses throughout, trace edema, no cyanosis no clubbing    EKG:  EKG is ordered today. The ekg ordered today demonstrates sinus rhythm,  rate 76, axis within normal limits, intervals within normal limits, no acute ST-T wave  changes.   Recent Labs: 05/03/2019: ALT 14; BUN 12; Creatinine, Ser 1.05; Hemoglobin 12.7; Platelets 265; Potassium 4.4; Sodium 138; TSH 1.160    Lipid Panel    Component Value Date/Time   CHOL 176 05/03/2019 1046   TRIG 51 05/03/2019 1046   TRIG 71 04/01/2015 0934   HDL 60 05/03/2019 1046   HDL 56 04/01/2015 0934   CHOLHDL 2.9 05/03/2019 1046   CHOLHDL 2.9 06/12/2015 0529   VLDL 9 06/12/2015 0529   LDLCALC 106 (H) 05/03/2019 1046   LDLCALC 121 (H) 08/29/2013 0909      Wt Readings from Last 3 Encounters:  07/10/19 158 lb (71.7 kg)  07/09/19 160 lb (72.6 kg)  06/06/19 160 lb (72.6 kg)      Other studies Reviewed: Additional studies/ records that were reviewed today include: CT, labs. Review of the above records demonstrates:  Please see elsewhere in the note.     ASSESSMENT AND PLAN:  ARRHYTHMIA - The patient's had no further significant symptoms suggesting new dysrhythmia.  No change in therapy.  CORONARY ARTERY DISEASE - The patient has no new symptoms since his perfusion study in 2017.  He will continue with risk reduction.   ATRIAL FIB- He did have this in the past but because of GI bleeding he was not taking anticoagulation.  He has not had any symptomatic recurrence of this.  He is now having some falls and so again would not be a good candidate for anticoagulation unless we prove he is continuing to have this.   ASCENDING AORTIC ANEURYSM- This was 4.4 cm and be followed up in 1 year.  BRUIT: He does have a carotid bruit and I will order Doppler.  COVID EDUCATION - He has had his vaccinations.  Current medicines are reviewed at length with the patient today.  The patient does not have concerns regarding medicines.  The following changes have been made:  no change  Labs/ tests ordered today include:   Orders Placed This Encounter  Procedures  . US Carotid Bilateral  . EKG 12-Lead     Disposition:   FU with me in one year.      Signed, Minus Breeding, MD  07/10/2019 4:46 PM    [facility]

## 2019-07-09 NOTE — Patient Instructions (Addendum)
A blood test is requested for you today.  We'll let you know about the results.  We can skip the MRI this year.   Please come back for a follow-up appointment in 6 months.

## 2019-07-09 NOTE — Progress Notes (Signed)
Subjective:    Patient ID: [name], male    DOB: [birth year], 86 y.o.   MRN: [id number]  HPI Pt returns for f/u of: pituitary macroadenoma (found incidentally on MRI of the C-spine in 2009; he sees Dr Carloyn Manner for this; last MRI in 2020 was unchanged, but he had a rash after Gd infusion):  denies headache.  hyperprolactinemia: (dx'ed 2009; he was rx'ed parlodel, but dosage has been limited by dizziness; he also had this at minimal dosage of cabergoline).  Lightheadedness persists, but it is mild.   Chronic primary hypothyroidism:   He takes synthroid as rx'ed.  Past Medical History:  Diagnosis Date  . Abnormality of gait 06/07/2013  . Anemia   . Aneurysm ([place])    on assending aorta, currently watching it, Dr Cyndia Bent  . Anxiety   . Arthritis   . Asthma   . Atrial fibrillation ([place])   . Barrett's esophagus   . CAD (coronary artery disease) 03/2006   3.0 x 20 mm TAXUS Perseus DES to the LAD; 01/2007  L main 30%, oLAD 50%, pLAD stent ok, CFX 80%, OM 60%, pRCA 60%, mRCA 70%, oPDA 90%; med rx   . Cancer ([place])    skin cancer   . Cataract    bilateral removal of cateracts  . Complication of anesthesia     no issues,but pt prefers spinal due to Pulmonary problems  . COPD (chronic obstructive pulmonary disease) ([place])    oxygen  on standby in home.  . Depression   . Diverticulosis   . Emphysema   . Enlarged prostate with urinary retention   . Esophageal stenosis   . GERD (gastroesophageal reflux disease)   . Hiatal hernia   . Hypothyroidism   . Neuropathy   . Osteoporosis   . Pituitary macroadenoma ([place])   . Restless legs syndrome (RLS)   . UGIB (upper gastrointestinal bleed) 03/2013   EGD w/ large ulcer at GE junction    Past Surgical History:  Procedure Laterality Date  . ABDOMINAL HERNIA REPAIR   2008  . CARDIAC CATHETERIZATION  01/2007   L main 30%, oLAD 50%,  pLAD stent ok, CFX 80%, OM 60%, pRCA 60%, mRCA 70%, oPDA 90%; med rx  . cataract extraction both eyes    .  COLONOSCOPY    . CORONARY STENT PLACEMENT  03/2006   3.0 x 20 mm TAXUS Perseus DES to the LAD  . CYSTOSCOPY N/A 06/10/2015   Procedure: CYSTOSCOPY FULGRATION OF BLEEDING,  electovapor resection;  Surgeon: Irine Seal, MD;  Location: [facility];  Service: Urology;  Laterality: N/A;  . CYSTOSCOPY WITH INSERTION OF UROLIFT N/A 06/01/2015   Procedure: CYSTOSCOPY WITH INSERTION OF UROLIFT x4;  Surgeon: Irine Seal, MD;  Location: [facility];  Service: Urology;  Laterality: N/A;  . ESOPHAGOGASTRODUODENOSCOPY N/A 04/20/2013   Procedure: ESOPHAGOGASTRODUODENOSCOPY (EGD);  Surgeon: Inda Castle, MD;  Location: [facility];  Service: Endoscopy;  Laterality: N/A;  . ESOPHAGOGASTRODUODENOSCOPY N/A 03/25/2016   Procedure: ESOPHAGOGASTRODUODENOSCOPY (EGD);  Surgeon: Irene Shipper, MD;  Location: [facility];  Service: Endoscopy;  Laterality: N/A;  . ESOPHAGOGASTRODUODENOSCOPY (EGD) WITH PROPOFOL N/A 10/13/2015   Procedure: ESOPHAGOGASTRODUODENOSCOPY (EGD) WITH PROPOFOL;  Surgeon: Jerene Bears, MD;  [facility]: [facility];  Service: Gastroenterology;  Laterality: N/A;  . FOOT SURGERY  1994 left, 2002 right foot   bilateral foot reconstruciton  . FOOT SURGERY     reconstruction of both feet- no retained hardware.  Marland Kitchen HIATAL HERNIA REPAIR  01-04-2008  .  MELANOMA EXCISION  2019  . POLYPECTOMY    . PROSTATE SURGERY     x 2  . SAVORY DILATION N/A 03/25/2016   Procedure: SAVORY DILATION;  Surgeon: Irene Shipper, MD;  [facility]: [facility];  Service: Endoscopy;  Laterality: N/A;  . TOTAL HIP ARTHROPLASTY  07/21/2011   Procedure: TOTAL HIP ARTHROPLASTY ANTERIOR APPROACH;  Surgeon: Mauri Pole, MD;  Location: [facility];  Service: Orthopedics;  Laterality: Left;  . TOTAL HIP ARTHROPLASTY Right 09/07/2012   Procedure: RIGHT TOTAL HIP ARTHROPLASTY ANTERIOR APPROACH;  Surgeon: Mcarthur Rossetti, MD;  Location: [facility];  Service: Orthopedics;  Laterality: Right;  . TRANSURETHRAL RESECTION OF BLADDER NECK N/A 11/04/2015    Procedure: RESECTION OF BLADDER NECK;  Surgeon: Cleon Gustin, MD;  Location: [facility];  Service: Urology;  Laterality: N/A;  . TRANSURETHRAL RESECTION OF PROSTATE N/A 11/04/2015   Procedure: TRANSURETHRAL RESECTION OF THE PROSTATE (TURP); REMOVAL OF UROLIFT IMPLANTS X THREE;  Surgeon: Cleon Gustin, MD;  Location: [facility];  Service: Urology;  Laterality: N/A;  . UPPER GASTROINTESTINAL ENDOSCOPY  12/2013   Dr Hilarie Fredrickson, gastritis  . VIDEO BRONCHOSCOPY Bilateral 02/01/2017   Procedure: VIDEO BRONCHOSCOPY WITHOUT FLUORO;  Surgeon: Tanda Rockers, MD;  Location[facility];  Service: Cardiopulmonary;  Laterality: Bilateral;    Social History   Socioeconomic History  . Marital status: Widowed    Spouse name: Not on file  . Number of children: 2  . Years of education: 76  . Highest education level: Associate degree: occupational, Hotel manager, or vocational program  Occupational History  . Occupation: retired  Tobacco Use  . Smoking status: Never Smoker  . Smokeless tobacco: Never Used  Substance and Sexual Activity  . Alcohol use: No  . Drug use: No  . Sexual activity: Not Currently    Birth control/protection: None  Other Topics Concern  . Not on file  Social History Narrative   Patient drinks caffeine a few times a week.   Patient is right handed.   Social Determinants of Health   Financial Resource Strain:   . Difficulty of Paying Living Expenses:   Food Insecurity:   . Worried About Charity fundraiser in the Last Year:   . Arboriculturist in the Last Year:   Transportation Needs:   . Film/video editor (Medical):   Marland Kitchen Lack of Transportation (Non-Medical):   Physical Activity: Insufficiently Active  . Days of Exercise per Week: 5 days  . Minutes of Exercise per Session: 20 min  Stress: No Stress Concern Present  . Feeling of Stress : Not at all  Social Connections:   . Frequency of Communication with Friends and Family:   . Frequency of Social Gatherings with  Friends and Family:   . Attends Religious Services:   . Active Member of Clubs or Organizations:   . Attends Archivist Meetings:   Marland Kitchen Marital Status:   Intimate Partner Violence:   . Fear of Current or Ex-Partner:   . Emotionally Abused:   Marland Kitchen Physically Abused:   . Sexually Abused:     Current Outpatient Medications on File Prior to Visit  Medication Sig Dispense Refill  . albuterol (PROVENTIL HFA;VENTOLIN HFA) 108 (90 Base) MCG/ACT inhaler Inhale 2 puffs into the lungs every 6 (six) hours as needed for wheezing or shortness of breath. 18 g 2  . bisacodyl (DULCOLAX) 10 MG suppository Place 10 mg rectally as needed for moderate constipation.    Marland Kitchen  bromocriptine (PARLODEL) 2.5 MG tablet Take 1 tablet (2.5 mg total) by mouth 2 (two) times daily. 60 tablet 2  . busPIRone (BUSPAR) 15 MG tablet Take 1 tablet (15 mg total) by mouth 2 (two) times daily as needed. (Patient taking differently: Take 7.5 mg by mouth daily. ) 60 tablet 0  . calcium carbonate (OSCAL) 1500 (600 Ca) MG TABS tablet Take 600 mg of elemental calcium by mouth daily with breakfast.     . cholecalciferol (VITAMIN D) 1000 UNITS tablet Take 1,000 Units by mouth daily.     . ferrous sulfate 325 (65 FE) MG tablet Take 1 tablet (325 mg total) by mouth daily with breakfast. 90 tablet 3  . GAVILAX 17 GM/SCOOP powder DISSOLVE 17 GM SCOOP IN 8 OZ. OF WATER AND DRINK ONCE DAILY AS NEEDED FOR CONSTIPATION 510 g 0  . guaiFENesin (MUCINEX) 600 MG 12 hr tablet Take 600 mg by mouth daily.     Marland Kitchen levalbuterol (XOPENEX) 0.63 MG/3ML nebulizer solution Take 3 mLs (0.63 mg total) by nebulization every 6 (six) hours as needed for wheezing or shortness of breath. 72 mL 1  . levothyroxine (SYNTHROID) 75 MCG tablet Take 1 tablet (75 mcg total) by mouth daily. 90 tablet 3  . montelukast (SINGULAIR) 10 MG tablet     . naphazoline-pheniramine (NAPHCON-A) 0.025-0.3 % ophthalmic solution Place 1 drop into both eyes 2 (two) times daily as needed for  irritation or allergies.     . nitroGLYCERIN (NITROSTAT) 0.4 MG SL tablet Place 1 tablet (0.4 mg total) under the tongue every 5 (five) minutes as needed for chest pain. 25 tablet 2  . pantoprazole (PROTONIX) 40 MG tablet 1 - 2 tablets daily 180 tablet 3  . Respiratory Therapy Supplies (FLUTTER) DEVI Twice a day and prn as needed, may increase if feeling worse 1 each 0  . sucralfate (CARAFATE) 1 g tablet Take 1 tablet three times daily as needed 270 tablet 3  . SYMBICORT 160-4.5 MCG/ACT inhaler INHALE 2 PUFFS 2 TIMES A DAY 10.2 g 5  . tamsulosin (FLOMAX) 0.4 MG CAPS capsule Take 1 capsule (0.4 mg total) by mouth daily. 30 capsule 3  . tobramycin-dexamethasone (TOBRADEX) ophthalmic solution Apply 1 drop in affected eye(s) every 2 hours for two days. Then every 4 hours for 5 days. 5 mL 0  . triamcinolone cream (KENALOG) 0.1 % Apply 1 application topically 2 (two) times daily. 30 g 0  . vitamin B-12 (CYANOCOBALAMIN) 1000 MCG tablet Take 1,000 mcg by mouth daily.    . vitamin E (VITAMIN E) 400 UNIT capsule Take 400 Units by mouth daily.     No current facility-administered medications on file prior to visit.    Allergies  Allergen Reactions  . Betadine [Povidone Iodine] Other (See Comments)    Reaction:  Blisters   . Gadavist [Gadobutrol]     hives  . Lortab [Hydrocodone-Acetaminophen] Nausea And Vomiting  . Penicillins Other (See Comments)    Reaction:  Lightheadedness  Has patient had a PCN reaction causing immediate rash, facial/tongue/throat swelling, SOB or lightheadedness with hypotension: Yes Has patient had a PCN reaction causing severe rash involving mucus membranes or skin necrosis: No Has patient had a PCN reaction that required hospitalization No Has patient had a PCN reaction occurring within the last 10 years: No If all of the above answers are "NO", then may proceed with Cephalosporin use.  Marland Kitchen Zetia [Ezetimibe] Other (See Comments)    Reaction:  Muscle weakness   . Zocor  [  Simvastatin] Nausea Only and Other (See Comments)    Reaction:  Muscle weakness     Family History  Problem Relation Age of Onset  . Heart disease Sister   . Heart failure Father   . Other Mother        phebitis related to Jorah's birth, he was 38 weeks old when she died  . Colon cancer Neg Hx   . Esophageal cancer Neg Hx   . Rectal cancer Neg Hx   . Stomach cancer Neg Hx   . Colon polyps Neg Hx     BP 112/70   Pulse 81   Ht 5\' 4"  (1.626 m)   Wt 160 lb (72.6 kg)   SpO2 97%   BMI 27.46 kg/m    Review of Systems Denies LOC    Objective:   Physical Exam VITAL SIGNS:  See vs page GENERAL: no distress CV: trace bilat leg edema GAIT: normal and steady.     Prolactin=51  Lab Results  Component Value Date   TSH 1.160 05/03/2019   T4TOTAL 7.2 05/03/2019      Assessment & Plan:  Hyperprolactinemia: not well-controlled pituitary adenoma: we discussed f/u MRI: he declines this year.  Lightheadedness: side effect of cabergoline: This limits rx options and dosages   Patient Instructions  A blood test is requested for you today.  We'll let you know about the results.  We can skip the MRI this year.   Please come back for a follow-up appointment in 6 months.

## 2019-07-10 ENCOUNTER — Encounter: Payer: Self-pay | Admitting: Cardiology

## 2019-07-10 ENCOUNTER — Ambulatory Visit: Payer: Medicare Other | Admitting: Cardiology

## 2019-07-10 VITALS — BP 118/70 | HR 76 | Ht 65.0 in | Wt 158.0 lb

## 2019-07-10 DIAGNOSIS — I480 Paroxysmal atrial fibrillation: Secondary | ICD-10-CM | POA: Diagnosis not present

## 2019-07-10 DIAGNOSIS — Z7189 Other specified counseling: Secondary | ICD-10-CM

## 2019-07-10 DIAGNOSIS — I2510 Atherosclerotic heart disease of native coronary artery without angina pectoris: Secondary | ICD-10-CM | POA: Diagnosis not present

## 2019-07-10 DIAGNOSIS — I712 Thoracic aortic aneurysm, without rupture: Secondary | ICD-10-CM

## 2019-07-10 DIAGNOSIS — I7121 Aneurysm of the ascending aorta, without rupture: Secondary | ICD-10-CM

## 2019-07-10 DIAGNOSIS — R0989 Other specified symptoms and signs involving the circulatory and respiratory systems: Secondary | ICD-10-CM

## 2019-07-10 LAB — PROLACTIN: Prolactin: 50.5 ng/mL — ABNORMAL HIGH (ref 2.0–18.0)

## 2019-07-10 NOTE — Patient Instructions (Addendum)
Medication Instructions:  The current medical regimen is effective;  continue present plan and medications.  *If you need a refill on your cardiac medications before your next appointment, please call your pharmacy*  Testing/Procedures: Your physician has requested that you have a carotid duplex. This test is an ultrasound of the carotid arteries in your neck. It looks at blood flow through these arteries that supply the brain with blood. Allow one hour for this exam. There are no restrictions or special instructions. This will be completed at [place] will check in at the Main Entrance.  You may eat/drink as normal this day.  Follow-Up: At [facility], you and your health needs are our priority.  As part of our continuing mission to provide you with exceptional heart care, we have created designated Provider Care Teams.  These Care Teams include your primary Cardiologist (physician) and Advanced Practice Providers (APPs -  Physician Assistants and Nurse Practitioners) who all work together to provide you with the care you need, when you need it.  We recommend signing up for the patient portal called "MyChart".  Sign up information is provided on this After Visit Summary.  MyChart is used to connect with patients for Virtual Visits (Telemedicine).  Patients are able to view lab/test results, encounter notes, upcoming appointments, etc.  Non-urgent messages can be sent to your provider as well.   To learn more about what you can do with MyChart, go to [email or web address].    Your next appointment:   12 month(s)  The format for your next appointment:   In Person  Provider:   Minus Breeding, MD   Thank you for choosing [facility]!!

## 2019-07-18 ENCOUNTER — Ambulatory Visit (HOSPITAL_COMMUNITY)
Admission: RE | Admit: 2019-07-18 | Discharge: 2019-07-18 | Disposition: A | Discharge status: Home / Self Care | Payer: Medicare Other | Source: Ambulatory Visit | Attending: Cardiology | Admitting: Cardiology

## 2019-07-18 ENCOUNTER — Other Ambulatory Visit: Payer: Self-pay

## 2019-07-18 DIAGNOSIS — R0989 Other specified symptoms and signs involving the circulatory and respiratory systems: Secondary | ICD-10-CM | POA: Diagnosis not present

## 2019-07-18 DIAGNOSIS — I6523 Occlusion and stenosis of bilateral carotid arteries: Secondary | ICD-10-CM | POA: Diagnosis not present

## 2019-08-16 ENCOUNTER — Encounter: Payer: Self-pay | Admitting: Family Medicine

## 2019-08-27 ENCOUNTER — Ambulatory Visit (INDEPENDENT_AMBULATORY_CARE_PROVIDER_SITE_OTHER): Payer: Medicare Other | Admitting: Nurse Practitioner

## 2019-08-27 ENCOUNTER — Other Ambulatory Visit: Payer: Self-pay

## 2019-08-27 ENCOUNTER — Encounter: Payer: Self-pay | Admitting: Nurse Practitioner

## 2019-08-27 VITALS — BP 94/57 | HR 70 | Temp 98.5°F | Ht 65.0 in | Wt 153.0 lb

## 2019-08-27 DIAGNOSIS — K5909 Other constipation: Secondary | ICD-10-CM

## 2019-08-27 DIAGNOSIS — M7741 Metatarsalgia, right foot: Secondary | ICD-10-CM | POA: Diagnosis not present

## 2019-08-27 MED ORDER — DICLOFENAC SODIUM 1 % EX GEL: 2.0000 g | Freq: Four times a day (QID) | CUTANEOUS | 1 refills | Status: DC

## 2019-08-27 NOTE — Patient Instructions (Signed)

## 2019-08-27 NOTE — Progress Notes (Signed)
Acute Office Visit  Subjective:    Patient ID: [name], male    DOB: [birth year], 86 y.o.   MRN: [id number]  Chief Complaint  Patient presents with  . Toe Pain    right 2nd toe    Toe Pain  Incident onset: 3 weeks ago. The incident occurred at home. There was no injury mechanism. Pain location: Right second toe metatarsal. The quality of the pain is described as aching. The pain is at a severity of 2/10. The pain is mild. The pain has been improving since onset. Associated symptoms comments: Pain with walking or when bear foot. He reports no foreign bodies present. The symptoms are aggravated by movement and palpation. He has tried nothing for the symptoms.   Patient is in today for chronic constipation follow-up, first diagnosed in 2020. patient is reporting well-managed chronic constipation.  No changes necessary to current management.  Patient is not reporting any abdominal pain, cramps, nausea or vomiting.  Patient continues to eat a healthy diet and recently started drinking boost which he says has made him put on extra pounds around his abdominal area.  Patient is also reporting increase fluid intake.   Past Medical History:  Diagnosis Date  . Abnormality of gait 06/07/2013  . Anemia   . Aneurysm ([place])    on assending aorta, currently watching it, Dr Cyndia Bent  . Anxiety   . Arthritis   . Asthma   . Atrial fibrillation ([place])   . Barrett's esophagus   . CAD (coronary artery disease) 03/2006   3.0 x 20 mm TAXUS Perseus DES to the LAD; 01/2007  L main 30%, oLAD 50%, pLAD stent ok, CFX 80%, OM 60%, pRCA 60%, mRCA 70%, oPDA 90%; med rx   . Cancer ([place])    skin cancer   . Cataract    bilateral removal of cateracts  . Complication of anesthesia     no issues,but pt prefers spinal due to Pulmonary problems  . COPD (chronic obstructive pulmonary disease) ([place])    oxygen  on standby in home.  . Depression   . Diverticulosis   . Emphysema   . Enlarged prostate with urinary  retention   . Esophageal stenosis   . GERD (gastroesophageal reflux disease)   . Hiatal hernia   . Hypothyroidism   . Neuropathy   . Osteoporosis   . Pituitary macroadenoma ([place])   . Restless legs syndrome (RLS)   . UGIB (upper gastrointestinal bleed) 03/2013   EGD w/ large ulcer at GE junction    Past Surgical History:  Procedure Laterality Date  . ABDOMINAL HERNIA REPAIR   2008  . CARDIAC CATHETERIZATION  01/2007   L main 30%, oLAD 50%,  pLAD stent ok, CFX 80%, OM 60%, pRCA 60%, mRCA 70%, oPDA 90%; med rx  . cataract extraction both eyes    . COLONOSCOPY    . CORONARY STENT PLACEMENT  03/2006   3.0 x 20 mm TAXUS Perseus DES to the LAD  . CYSTOSCOPY N/A 06/10/2015   Procedure: CYSTOSCOPY FULGRATION OF BLEEDING,  electovapor resection;  Surgeon: Irine Seal, MD;  Location: [facility];  Service: Urology;  Laterality: N/A;  . CYSTOSCOPY WITH INSERTION OF UROLIFT N/A 06/01/2015   Procedure: CYSTOSCOPY WITH INSERTION OF UROLIFT x4;  Surgeon: Irine Seal, MD;  Location: [facility];  Service: Urology;  Laterality: N/A;  . ESOPHAGOGASTRODUODENOSCOPY N/A 04/20/2013   Procedure: ESOPHAGOGASTRODUODENOSCOPY (EGD);  Surgeon: Inda Castle, MD;  Location: [facility];  Service: Endoscopy;  Laterality: N/A;  . ESOPHAGOGASTRODUODENOSCOPY N/A 03/25/2016   Procedure: ESOPHAGOGASTRODUODENOSCOPY (EGD);  Surgeon: Irene Shipper, MD;  Location: [facility];  Service: Endoscopy;  Laterality: N/A;  . ESOPHAGOGASTRODUODENOSCOPY (EGD) WITH PROPOFOL N/A 10/13/2015   Procedure: ESOPHAGOGASTRODUODENOSCOPY (EGD) WITH PROPOFOL;  Surgeon: Jerene Bears, MD;  Location: [facility];  Service: Gastroenterology;  Laterality: N/A;  . FOOT SURGERY  1994 left, 2002 right foot   bilateral foot reconstruciton  . FOOT SURGERY     reconstruction of both feet- no retained hardware.  Marland Kitchen HIATAL HERNIA REPAIR  01-04-2008  . MELANOMA EXCISION  2019  . POLYPECTOMY    . PROSTATE SURGERY     x 2  . SAVORY DILATION N/A 03/25/2016    Procedure: SAVORY DILATION;  Surgeon: Irene Shipper, MD;  [facility]: [facility];  Service: Endoscopy;  Laterality: N/A;  . TOTAL HIP ARTHROPLASTY  07/21/2011   Procedure: TOTAL HIP ARTHROPLASTY ANTERIOR APPROACH;  Surgeon: Mauri Pole, MD;  Location: [facility];  Service: Orthopedics;  Laterality: Left;  . TOTAL HIP ARTHROPLASTY Right 09/07/2012   Procedure: RIGHT TOTAL HIP ARTHROPLASTY ANTERIOR APPROACH;  Surgeon: Mcarthur Rossetti, MD;  Location: [facility];  Service: Orthopedics;  Laterality: Right;  . TRANSURETHRAL RESECTION OF BLADDER NECK N/A 11/04/2015   Procedure: RESECTION OF BLADDER NECK;  Surgeon: Cleon Gustin, MD;  Location: [facility];  Service: Urology;  Laterality: N/A;  . TRANSURETHRAL RESECTION OF PROSTATE N/A 11/04/2015   Procedure: TRANSURETHRAL RESECTION OF THE PROSTATE (TURP); REMOVAL OF UROLIFT IMPLANTS X THREE;  Surgeon: Cleon Gustin, MD;  Location: [facility];  Service: Urology;  Laterality: N/A;  . UPPER GASTROINTESTINAL ENDOSCOPY  12/2013   Dr Hilarie Fredrickson, gastritis  . VIDEO BRONCHOSCOPY Bilateral 02/01/2017   Procedure: VIDEO BRONCHOSCOPY WITHOUT FLUORO;  Surgeon: Tanda Rockers, MD;  Location: [facility];  Service: Cardiopulmonary;  Laterality: Bilateral;    Family History  Problem Relation Age of Onset  . Heart disease Sister   . Heart failure Father   . Other Mother        phebitis related to Jacobo's birth, he was 3 weeks old when she died  . Colon cancer Neg Hx   . Esophageal cancer Neg Hx   . Rectal cancer Neg Hx   . Stomach cancer Neg Hx   . Colon polyps Neg Hx     Social History   Socioeconomic History  . Marital status: Widowed    Spouse name: Not on file  . Number of children: 2  . Years of education: 28  . Highest education level: Associate degree: occupational, Hotel manager, or vocational program  Occupational History  . Occupation: retired  Tobacco Use  . Smoking status: Never Smoker  . Smokeless tobacco: Never Used  Vaping Use  . Vaping Use:  Never used  Substance and Sexual Activity  . Alcohol use: No  . Drug use: No  . Sexual activity: Not Currently    Birth control/protection: None  Other Topics Concern  . Not on file  Social History Narrative   Patient drinks caffeine a few times a week.   Patient is right handed.   Social Determinants of Health   Financial Resource Strain:   . Difficulty of Paying Living Expenses:   Food Insecurity:   . Worried About Charity fundraiser in the Last Year:   . Arboriculturist in the Last Year:   Transportation Needs:   . Film/video editor (Medical):   Marland Kitchen Lack of Transportation (  Non-Medical):   Physical Activity: Insufficiently Active  . Days of Exercise per Week: 5 days  . Minutes of Exercise per Session: 20 min  Stress: No Stress Concern Present  . Feeling of Stress : Not at all  Social Connections:   . Frequency of Communication with Friends and Family:   . Frequency of Social Gatherings with Friends and Family:   . Attends Religious Services:   . Active Member of Clubs or Organizations:   . Attends Archivist Meetings:   Marland Kitchen Marital Status:   Intimate Partner Violence:   . Fear of Current or Ex-Partner:   . Emotionally Abused:   Marland Kitchen Physically Abused:   . Sexually Abused:     Outpatient Medications Prior to Visit  Medication Sig Dispense Refill  . albuterol (PROVENTIL HFA;VENTOLIN HFA) 108 (90 Base) MCG/ACT inhaler Inhale 2 puffs into the lungs every 6 (six) hours as needed for wheezing or shortness of breath. 18 g 2  . bisacodyl (DULCOLAX) 10 MG suppository Place 10 mg rectally as needed for moderate constipation.    . bromocriptine (PARLODEL) 2.5 MG tablet Take 1 tablet (2.5 mg total) by mouth 2 (two) times daily. 60 tablet 2  . busPIRone (BUSPAR) 15 MG tablet Take 1 tablet (15 mg total) by mouth 2 (two) times daily as needed. (Patient taking differently: Take 7.5 mg by mouth daily. ) 60 tablet 0  . calcium carbonate (OSCAL) 1500 (600 Ca) MG TABS tablet  Take 600 mg of elemental calcium by mouth daily with breakfast.     . cholecalciferol (VITAMIN D) 1000 UNITS tablet Take 1,000 Units by mouth daily.     . ferrous sulfate 325 (65 FE) MG tablet Take 1 tablet (325 mg total) by mouth daily with breakfast. 90 tablet 3  . GAVILAX 17 GM/SCOOP powder DISSOLVE 17 GM SCOOP IN 8 OZ. OF WATER AND DRINK ONCE DAILY AS NEEDED FOR CONSTIPATION 510 g 0  . guaiFENesin (MUCINEX) 600 MG 12 hr tablet Take 600 mg by mouth daily.     Marland Kitchen levalbuterol (XOPENEX) 0.63 MG/3ML nebulizer solution Take 3 mLs (0.63 mg total) by nebulization every 6 (six) hours as needed for wheezing or shortness of breath. 72 mL 1  . levothyroxine (SYNTHROID) 75 MCG tablet Take 1 tablet (75 mcg total) by mouth daily. 90 tablet 3  . MAGNESIUM CITRATE PO Take by mouth daily.    . montelukast (SINGULAIR) 10 MG tablet     . naphazoline-pheniramine (NAPHCON-A) 0.025-0.3 % ophthalmic solution Place 1 drop into both eyes 2 (two) times daily as needed for irritation or allergies.     . nitroGLYCERIN (NITROSTAT) 0.4 MG SL tablet Place 1 tablet (0.4 mg total) under the tongue every 5 (five) minutes as needed for chest pain. 25 tablet 2  . pantoprazole (PROTONIX) 40 MG tablet 1 - 2 tablets daily 180 tablet 3  . Respiratory Therapy Supplies (FLUTTER) DEVI Twice a day and prn as needed, may increase if feeling worse 1 each 0  . sucralfate (CARAFATE) 1 g tablet Take 1 tablet three times daily as needed 270 tablet 3  . SYMBICORT 160-4.5 MCG/ACT inhaler INHALE 2 PUFFS 2 TIMES A DAY 10.2 g 5  . tamsulosin (FLOMAX) 0.4 MG CAPS capsule Take 1 capsule (0.4 mg total) by mouth daily. 30 capsule 3  . triamcinolone cream (KENALOG) 0.1 % Apply 1 application topically 2 (two) times daily. 30 g 0  . vitamin B-12 (CYANOCOBALAMIN) 1000 MCG tablet Take 1,000 mcg by mouth daily.    Marland Kitchen  vitamin E (VITAMIN E) 400 UNIT capsule Take 400 Units by mouth daily.    Marland Kitchen tobramycin-dexamethasone (TOBRADEX) ophthalmic solution Apply 1 drop  in affected eye(s) every 2 hours for two days. Then every 4 hours for 5 days. (Patient not taking: Reported on 08/27/2019) 5 mL 0   No facility-administered medications prior to visit.    Allergies  Allergen Reactions  . Betadine [Povidone Iodine] Other (See Comments)    Reaction:  Blisters   . Gadavist [Gadobutrol]     hives  . Lortab [Hydrocodone-Acetaminophen] Nausea And Vomiting  . Penicillins Other (See Comments)    Reaction:  Lightheadedness  Has patient had a PCN reaction causing immediate rash, facial/tongue/throat swelling, SOB or lightheadedness with hypotension: Yes Has patient had a PCN reaction causing severe rash involving mucus membranes or skin necrosis: No Has patient had a PCN reaction that required hospitalization No Has patient had a PCN reaction occurring within the last 10 years: No If all of the above answers are "NO", then may proceed with Cephalosporin use.  Marland Kitchen Zetia [Ezetimibe] Other (See Comments)    Reaction:  Muscle weakness   . Zocor [Simvastatin] Nausea Only and Other (See Comments)    Reaction:  Muscle weakness     Review of Systems  Constitutional: Negative.   HENT: Negative.   Eyes: Negative.   Respiratory: Negative.  Negative for cough, chest tightness and shortness of breath.   Gastrointestinal: Negative.   Genitourinary: Negative.   Musculoskeletal: Positive for joint swelling.  Skin: Negative for color change.  Neurological: Negative.        Objective:    Physical Exam Constitutional:      Appearance: Normal appearance.  HENT:     Head: Normocephalic.  Eyes:     Conjunctiva/sclera: Conjunctivae normal.  Cardiovascular:     Rate and Rhythm: Normal rate.     Pulses: Normal pulses.     Heart sounds: Normal heart sounds.  Pulmonary:     Effort: Pulmonary effort is normal.     Breath sounds: Normal breath sounds.  Abdominal:     General: Bowel sounds are normal.  Musculoskeletal:        General: Tenderness present.     Cervical  back: Neck supple.  Skin:    General: Skin is dry.     Findings: Bruising present.  Neurological:     Mental Status: He is alert and oriented to person, place, and time.  Psychiatric:        Mood and Affect: Mood normal.        Behavior: Behavior normal.     BP (!) 94/57   Pulse 70   Temp 98.5 F (36.9 C) (Temporal)   Ht 5\' 5"  (1.651 m)   Wt 153 lb (69.4 kg)   SpO2 98%   BMI 25.46 kg/m  Wt Readings from Last 3 Encounters:  08/27/19 153 lb (69.4 kg)  07/10/19 158 lb (71.7 kg)  07/09/19 160 lb (72.6 kg)    Health Maintenance Due  Topic Date Due  . COVID-19 Vaccine (1) Never done      Lab Results  Component Value Date   TSH 1.160 05/03/2019   Lab Results  Component Value Date   WBC 9.2 05/03/2019   HGB 12.7 (L) 05/03/2019   HCT 38.1 05/03/2019   MCV 97 05/03/2019   PLT 265 05/03/2019   Lab Results  Component Value Date   NA 138 05/03/2019   K 4.4 05/03/2019   CO2 23 05/03/2019  GLUCOSE 80 05/03/2019   BUN 12 05/03/2019   CREATININE 1.05 05/03/2019   BILITOT 0.5 05/03/2019   ALKPHOS 89 05/03/2019   AST 24 05/03/2019   ALT 14 05/03/2019   PROT 6.5 05/03/2019   ALBUMIN 3.8 05/03/2019   CALCIUM 9.1 05/03/2019   ANIONGAP 6 10/23/2015   GFR 73.80 05/07/2018   Lab Results  Component Value Date   CHOL 176 05/03/2019   Lab Results  Component Value Date   HDL 60 05/03/2019   Lab Results  Component Value Date   LDLCALC 106 (H) 05/03/2019   Lab Results  Component Value Date   TRIG 51 05/03/2019   Lab Results  Component Value Date   CHOLHDL 2.9 05/03/2019   No results found for: HGBA1C     Assessment & Plan:   Problem List Items Addressed This Visit      Digestive   Chronic constipation    Patient is reporting well managed chronic constipation.  No changes necessary to current medication dose.  Patient is not reporting any abdominal pain, cramps, nausea or vomiting.  Patient continues to eat a healthy fiber diet, and increase  fluid. Patient knows to follow-up with worsening symptoms of constipation.        Other   Metatarsalgia of right foot - Primary    Patient is a 86 year old male who presents to clinic today with toe pain and mild tenderness, after assessment toe pain is worse on the second right metatarsal.  This has been ongoing for 3 weeks.  Patient reports that pain is improving.  No injuries, no redness or swelling.  Patient describes pain as 2 out of 10 from a pain scale of 0-10.  Pain is aching and intermittent.  Pain is worse with walking and standing.  Patient has used nothing to alleviate pain. Voltaren gel application 4 times daily started, cold compress, metatarsal support ordered, reducing pressure on foot advised.  Education provided to patient with printed handout given. Patient knows to follow-up with worsening or unresolved symptoms. Medication sent to pharmacy.      Relevant Medications   diclofenac Sodium (VOLTAREN) 1 % GEL       Meds ordered this encounter  Medications  . diclofenac Sodium (VOLTAREN) 1 % GEL    Sig: Apply 2 g topically 4 (four) times daily.    Dispense:  50 g    Refill:  1    Order Specific Question:   Supervising Provider    Answer:   Caryl Pina A [[id number]]     Ivy Lynn, NP

## 2019-08-27 NOTE — Assessment & Plan Note (Signed)
Patient is reporting well managed chronic constipation.  No changes necessary to current medication dose.  Patient is not reporting any abdominal pain, cramps, nausea or vomiting.  Patient continues to eat a healthy fiber diet, and increase fluid. Patient knows to follow-up with worsening symptoms of constipation.

## 2019-08-27 NOTE — Assessment & Plan Note (Signed)
Patient is a 86 year old male who presents to clinic today with toe pain and mild tenderness, after assessment toe pain is worse on the second right metatarsal.  This has been ongoing for 3 weeks.  Patient reports that pain is improving.  No injuries, no redness or swelling.  Patient describes pain as 2 out of 10 from a pain scale of 0-10.  Pain is aching and intermittent.  Pain is worse with walking and standing.  Patient has used nothing to alleviate pain. Voltaren gel application 4 times daily started, cold compress, metatarsal support ordered, reducing pressure on foot advised.  Education provided to patient with printed handout given. Patient knows to follow-up with worsening or unresolved symptoms. Medication sent to pharmacy.

## 2019-09-24 ENCOUNTER — Encounter: Payer: Self-pay | Admitting: Nurse Practitioner

## 2019-09-24 ENCOUNTER — Other Ambulatory Visit: Payer: Self-pay

## 2019-09-24 ENCOUNTER — Ambulatory Visit (INDEPENDENT_AMBULATORY_CARE_PROVIDER_SITE_OTHER): Payer: Medicare Other | Admitting: Nurse Practitioner

## 2019-09-24 ENCOUNTER — Telehealth: Payer: Self-pay | Admitting: Internal Medicine

## 2019-09-24 VITALS — BP 132/78 | HR 67 | Temp 97.9°F | Resp 20 | Ht 63.0 in | Wt 155.0 lb

## 2019-09-24 DIAGNOSIS — R5383 Other fatigue: Secondary | ICD-10-CM | POA: Diagnosis not present

## 2019-09-24 NOTE — Telephone Encounter (Signed)
pt is calling to let us know that he is using flutter valve more and doing another cycle of ciprofloxacin - just an FYI - no need for a call back     By Talmadge Chad to Dr. Melvyn Novas as Juluis Rainier

## 2019-09-24 NOTE — Addendum Note (Signed)
Addended by: Rolena Infante on: 09/24/2019 08:37 AM   Modules accepted: Orders

## 2019-09-24 NOTE — Progress Notes (Signed)
   Subjective:    Patient ID: [name], male    DOB: 10-Jul-1932, 86 y.o.   MRN: [id number]   Chief Complaint: Fatigue   HPI Patient comes in today c/o fatigue and weakness. He says this started last week.  He said he felt weak and sluggish but he feels fine now. Has not had blood work done in [employer].   Review of Systems  Constitutional: Positive for fatigue. Negative for appetite change, chills, diaphoresis and fever.  Eyes: Negative for pain.  Respiratory: Negative for shortness of breath.   Cardiovascular: Negative for chest pain, palpitations and leg swelling.  Gastrointestinal: Negative for abdominal pain.  Endocrine: Negative for polydipsia.  Skin: Negative for rash.  Neurological: Negative for dizziness, weakness and headaches.  Hematological: Does not bruise/bleed easily.  All other systems reviewed and are negative.      Objective:   Physical Exam Vitals and nursing note reviewed.  Constitutional:      Appearance: Normal appearance. He is well-developed.  HENT:     Head: Normocephalic.     Nose: Nose normal.  Eyes:     Pupils: Pupils are equal, round, and reactive to light.  Neck:     Thyroid: No thyroid mass or thyromegaly.     Vascular: No carotid bruit or JVD.     Trachea: Phonation normal.  Cardiovascular:     Rate and Rhythm: Normal rate and regular rhythm.  Pulmonary:     Effort: Pulmonary effort is normal. No respiratory distress.     Breath sounds: Normal breath sounds.  Abdominal:     General: Bowel sounds are normal.     Palpations: Abdomen is soft.     Tenderness: There is no abdominal tenderness.  Musculoskeletal:        General: Normal range of motion.     Cervical back: Normal range of motion and neck supple.  Lymphadenopathy:     Cervical: No cervical adenopathy.  Skin:    General: Skin is warm and dry.  Neurological:     Mental Status: He is alert and oriented to person, place, and time.  Psychiatric:        Behavior: Behavior  normal.        Thought Content: Thought content normal.        Judgment: Judgment normal.    BP (!) 132/78   Pulse 67   Temp 97.9 F (36.6 C) (Temporal)   Resp 20   Ht _0  (1.6 m)   Wt 155 lb (70.3 kg)   SpO2 96%   BMI 27.46 kg/m         Assessment & Plan:  [name] in today with chief complaint of Fatigue (Wants bloodwork)   1. Fatigue, unspecified type Labs pending Appointment made for routine follow up with DR. Dettinger. - CBC with Differential/Platelet - CMP14+EGFR - Thyroid Panel With TSH    The above assessment and management plan was discussed with the patient. The patient verbalized understanding of and has agreed to the management plan. Patient is aware to call the clinic if symptoms persist or worsen. Patient is aware when to return to the clinic for a follow-up visit. Patient educated on when it is appropriate to go to the emergency department.   Mary-Margaret Hassell Done, FNP

## 2019-09-24 NOTE — Patient Instructions (Signed)

## 2019-09-25 LAB — CMP14+EGFR
ALT: 11 IU/L (ref 0–44)
AST: 19 IU/L (ref 0–40)
Albumin/Globulin Ratio: 1.7 (ref 1.2–2.2)
Albumin: 3.8 g/dL (ref 3.6–4.6)
Alkaline Phosphatase: 75 IU/L (ref 48–121)
BUN/Creatinine Ratio: 15 (ref 10–24)
BUN: 15 mg/dL (ref 8–27)
Bilirubin Total: 0.4 mg/dL (ref 0.0–1.2)
CO2: 22 mmol/L (ref 20–29)
Calcium: 8.9 mg/dL (ref 8.6–10.2)
Chloride: 106 mmol/L (ref 96–106)
Creatinine, Ser: 1.01 mg/dL (ref 0.76–1.27)
GFR calc Af Amer: 78 mL/min/{1.73_m2} (ref >59)
GFR calc non Af Amer: 68 mL/min/{1.73_m2} (ref >59)
Globulin, Total: 2.2 g/dL (ref 1.5–4.5)
Glucose: 82 mg/dL (ref 65–99)
Potassium: 4.1 mmol/L (ref 3.5–5.2)
Sodium: 140 mmol/L (ref 134–144)
Total Protein: 6 g/dL (ref 6.0–8.5)

## 2019-09-25 LAB — CBC WITH DIFFERENTIAL/PLATELET
Basophils Absolute: 0.2 10*3/uL (ref 0.0–0.2)
Basos: 2 %
EOS (ABSOLUTE): 0.3 10*3/uL (ref 0.0–0.4)
Eos: 4 %
Hematocrit: 35.4 % — ABNORMAL LOW (ref 37.5–51.0)
Hemoglobin: 11.8 g/dL — ABNORMAL LOW (ref 13.0–17.7)
Immature Grans (Abs): 0.4 10*3/uL — ABNORMAL HIGH (ref 0.0–0.1)
Immature Granulocytes: 5 %
Lymphocytes Absolute: 1.2 10*3/uL (ref 0.7–3.1)
Lymphs: 16 %
MCH: 32 pg (ref 26.6–33.0)
MCHC: 33.3 g/dL (ref 31.5–35.7)
MCV: 96 fL (ref 79–97)
Monocytes Absolute: 0.9 10*3/uL (ref 0.1–0.9)
Monocytes: 12 %
Neutrophils Absolute: 4.6 10*3/uL (ref 1.4–7.0)
Neutrophils: 61 %
Platelets: 221 10*3/uL (ref 150–450)
RBC: 3.69 x10E6/uL — ABNORMAL LOW (ref 4.14–5.80)
RDW: 13.7 % (ref 11.6–15.4)
WBC: 7.6 10*3/uL (ref 3.4–10.8)

## 2019-09-25 LAB — THYROID PANEL WITH TSH
Free Thyroxine Index: 2 (ref 1.2–4.9)
T3 Uptake Ratio: 30 % (ref 24–39)
T4, Total: 6.7 ug/dL (ref 4.5–12.0)
TSH: 1.01 u[IU]/mL (ref 0.450–4.500)

## 2019-09-25 LAB — PSA, TOTAL AND FREE
PSA, Free Pct: 7.3 %
PSA, Free: 1.34 ng/mL
Prostate Specific Ag, Serum: 18.4 ng/mL — ABNORMAL HIGH (ref 0.0–4.0)

## 2019-10-17 ENCOUNTER — Ambulatory Visit (INDEPENDENT_AMBULATORY_CARE_PROVIDER_SITE_OTHER): Payer: Medicare Other | Admitting: Family Medicine

## 2019-10-17 ENCOUNTER — Other Ambulatory Visit: Payer: Self-pay

## 2019-10-17 ENCOUNTER — Encounter: Payer: Self-pay | Admitting: Family Medicine

## 2019-10-17 VITALS — BP 114/68 | HR 65 | Temp 98.1°F | Ht 63.0 in | Wt 152.8 lb

## 2019-10-17 DIAGNOSIS — J449 Chronic obstructive pulmonary disease, unspecified: Secondary | ICD-10-CM

## 2019-10-17 DIAGNOSIS — E785 Hyperlipidemia, unspecified: Secondary | ICD-10-CM | POA: Diagnosis not present

## 2019-10-17 DIAGNOSIS — E034 Atrophy of thyroid (acquired): Secondary | ICD-10-CM

## 2019-10-17 DIAGNOSIS — K2100 Gastro-esophageal reflux disease with esophagitis, without bleeding: Secondary | ICD-10-CM | POA: Diagnosis not present

## 2019-10-17 DIAGNOSIS — I480 Paroxysmal atrial fibrillation: Secondary | ICD-10-CM | POA: Diagnosis not present

## 2019-10-17 NOTE — Progress Notes (Signed)
BP 114/68   Pulse 65   Temp 98.1 F (36.7 C) (Temporal)   Ht _0  (1.6 m)   Wt 152 lb 12.8 oz (69.3 kg)   BMI 27.07 kg/m    Subjective:   Patient ID: [name], male    DOB: Jun 23, 1932, 86 y.o.   MRN: [id number]  HPI: [name] is a 86 y.o. male presenting on 10/17/2019 for Gastroesophageal Reflux (3 mos), Hyperlipidemia, and Hypothyroidism   HPI Hypertension Patient is currently on no medication, and their blood pressure today is 114/68. Patient denies any lightheadedness or dizziness. Patient denies headaches, blurred vision, chest pains, shortness of breath, or weakness. Denies any side effects from medication and is content with current medication.   Hyperlipidemia Patient is coming in for recheck of his hyperlipidemia. The patient is currently taking no medication currently will check blood work and monitor.. They deny any issues with myalgias or history of liver damage from it. They deny any focal numbness or weakness or chest pain.   COPD Patient is coming in for COPD recheck today.  He is currently on albuterol and Symbicort, he is having some congestion but no wheezing or shortness of breath.  He has a mild chronic cough but denies any major coughing spells or wheezing spells.  He has 1nighttime symptoms per week and 3daytime symptoms per week currently.   Patient has chronic A. Fib. Patient has chronic A. fib and has been stable on it.  He is not on current anticoagulation, does have a cardiology.  Patient is complaining of some weakness and falls and balance issues, this has been increasing.  We discussed doing physical therapy but he would prefer to do therapy and exercises on his own at home.  Relevant past medical, surgical, family and social history reviewed and updated as indicated. Interim medical history since our last visit reviewed. Allergies and medications reviewed and updated.  Review of Systems  Constitutional: Negative for chills and fever.    Respiratory: Negative for shortness of breath and wheezing.   Cardiovascular: Negative for chest pain and leg swelling.  Musculoskeletal: Negative for back pain and gait problem.  Skin: Negative for rash.  Neurological: Positive for weakness. Negative for dizziness and numbness.  All other systems reviewed and are negative.   Per HPI unless specifically indicated above   Allergies as of 10/17/2019      Reactions   Betadine [povidone Iodine] Other (See Comments)   Reaction:  Blisters    Gadavist [gadobutrol]    hives   Lortab [hydrocodone-acetaminophen] Nausea And Vomiting   Penicillins Other (See Comments)   Reaction:  Lightheadedness  Has patient had a PCN reaction causing immediate rash, facial/tongue/throat swelling, SOB or lightheadedness with hypotension: Yes Has patient had a PCN reaction causing severe rash involving mucus membranes or skin necrosis: No Has patient had a PCN reaction that required hospitalization No Has patient had a PCN reaction occurring within the last 10 years: No If all of the above answers are "NO", then may proceed with Cephalosporin use.   Zetia [ezetimibe] Other (See Comments)   Reaction:  Muscle weakness    Zocor [simvastatin] Nausea Only, Other (See Comments)   Reaction:  Muscle weakness       Medication List       Accurate as of October 17, 2019 10:34 AM. If you have any questions, ask your nurse or doctor.        albuterol 108 (90 Base) MCG/ACT inhaler Commonly known  as: VENTOLIN HFA Inhale 2 puffs into the lungs every 6 (six) hours as needed for wheezing or shortness of breath.   bisacodyl 10 MG suppository Commonly known as: DULCOLAX Place 10 mg rectally as needed for moderate constipation.   bromocriptine 2.5 MG tablet Commonly known as: PARLODEL Take 1 tablet (2.5 mg total) by mouth 2 (two) times daily.   busPIRone 15 MG tablet Commonly known as: BUSPAR Take 1 tablet (15 mg total) by mouth 2 (two) times daily as  needed. What changed:   how much to take  when to take this   calcium carbonate 1500 (600 Ca) MG Tabs tablet Commonly known as: OSCAL Take 600 mg of elemental calcium by mouth daily with breakfast.   cholecalciferol 1000 units tablet Commonly known as: VITAMIN D Take 1,000 Units by mouth daily.   diclofenac Sodium 1 % Gel Commonly known as: VOLTAREN Apply 2 g topically 4 (four) times daily.   ferrous sulfate 325 (65 FE) MG tablet Take 1 tablet (325 mg total) by mouth daily with breakfast.   Flutter Devi Twice a day and prn as needed, may increase if feeling worse   GaviLAX 17 GM/SCOOP powder Generic drug: polyethylene glycol powder DISSOLVE 17 GM SCOOP IN 8 OZ. OF WATER AND DRINK ONCE DAILY AS NEEDED FOR CONSTIPATION   guaiFENesin 600 MG 12 hr tablet Commonly known as: MUCINEX Take 600 mg by mouth daily.   levalbuterol 0.63 MG/3ML nebulizer solution Commonly known as: Xopenex Take 3 mLs (0.63 mg total) by nebulization every 6 (six) hours as needed for wheezing or shortness of breath.   levothyroxine 75 MCG tablet Commonly known as: SYNTHROID Take 1 tablet (75 mcg total) by mouth daily.   MAGNESIUM CITRATE PO Take by mouth daily.   montelukast 10 MG tablet Commonly known as: SINGULAIR   naphazoline-pheniramine 0.025-0.3 % ophthalmic solution Commonly known as: NAPHCON-A Place 1 drop into both eyes 2 (two) times daily as needed for irritation or allergies.   nitroGLYCERIN 0.4 MG SL tablet Commonly known as: NITROSTAT Place 1 tablet (0.4 mg total) under the tongue every 5 (five) minutes as needed for chest pain.   pantoprazole 40 MG tablet Commonly known as: PROTONIX 1 - 2 tablets daily   sucralfate 1 g tablet Commonly known as: CARAFATE Take 1 tablet three times daily as needed   Symbicort 160-4.5 MCG/ACT inhaler Generic drug: budesonide-formoterol INHALE 2 PUFFS 2 TIMES A DAY   tamsulosin 0.4 MG Caps capsule Commonly known as: FLOMAX Take 1 capsule  (0.4 mg total) by mouth daily.   tobramycin-dexamethasone ophthalmic solution Commonly known as: TobraDex Apply 1 drop in affected eye(s) every 2 hours for two days. Then every 4 hours for 5 days.   triamcinolone cream 0.1 % Commonly known as: KENALOG Apply 1 application topically 2 (two) times daily.   vitamin B-12 1000 MCG tablet Commonly known as: CYANOCOBALAMIN Take 1,000 mcg by mouth daily.   vitamin E 180 MG (400 UNITS) capsule Generic drug: vitamin E Take 400 Units by mouth daily.        Objective:   BP 114/68   Pulse 65   Temp 98.1 F (36.7 C) (Temporal)   Ht _0  (1.6 m)   Wt 152 lb 12.8 oz (69.3 kg)   BMI 27.07 kg/m   Wt Readings from Last 3 Encounters:  10/17/19 152 lb 12.8 oz (69.3 kg)  09/24/19 155 lb (70.3 kg)  08/27/19 153 lb (69.4 kg)    Physical Exam Vitals and  nursing note reviewed.  Constitutional:      General: He is not in acute distress.    Appearance: He is well-developed. He is not diaphoretic.  Eyes:     General: No scleral icterus.    Conjunctiva/sclera: Conjunctivae normal.  Neck:     Thyroid: No thyromegaly.  Cardiovascular:     Rate and Rhythm: Normal rate and regular rhythm.     Heart sounds: Normal heart sounds. No murmur heard.   Pulmonary:     Effort: Pulmonary effort is normal. No respiratory distress.     Breath sounds: Normal breath sounds. No wheezing.  Musculoskeletal:        General: Normal range of motion.     Cervical back: Neck supple.  Lymphadenopathy:     Cervical: No cervical adenopathy.  Skin:    General: Skin is warm and dry.     Findings: No rash.  Neurological:     Mental Status: He is alert and oriented to person, place, and time.     Coordination: Coordination normal.  Psychiatric:        Behavior: Behavior normal.     Results for orders placed or performed in visit on 09/24/19  CBC with Differential/Platelet  Result Value Ref Range   WBC 7.6 3.4 - 10.8 x10E3/uL   RBC 3.69 (L) 4.14 - 5.80  x10E6/uL   Hemoglobin 11.8 (L) 13.0 - 17.7 g/dL   Hematocrit 35.4 (L) 37.5 - 51.0 %   MCV 96 79 - 97 fL   MCH 32.0 26.6 - 33.0 pg   MCHC 33.3 31 - 35 g/dL   RDW 13.7 11.6 - 15.4 %   Platelets 221 150 - 450 x10E3/uL   Neutrophils 61 Not Estab. %   Lymphs 16 Not Estab. %   Monocytes 12 Not Estab. %   Eos 4 Not Estab. %   Basos 2 Not Estab. %   Neutrophils Absolute 4.6 1 - 7 x10E3/uL   Lymphocytes Absolute 1.2 0 - 3 x10E3/uL   Monocytes Absolute 0.9 0 - 0 x10E3/uL   EOS (ABSOLUTE) 0.3 0.0 - 0.4 x10E3/uL   Basophils Absolute 0.2 0 - 0 x10E3/uL   Immature Granulocytes 5 Not Estab. %   Immature Grans (Abs) 0.4 (H) 0.0 - 0.1 x10E3/uL  CMP14+EGFR  Result Value Ref Range   Glucose 82 65 - 99 mg/dL   BUN 15 8 - 27 mg/dL   Creatinine, Ser 1.01 0.76 - 1.27 mg/dL   GFR calc non Af Amer 68 >59 mL/min/1.73   GFR calc Af Amer 78 >59 mL/min/1.73   BUN/Creatinine Ratio 15 10 - 24   Sodium 140 134 - 144 mmol/L   Potassium 4.1 3.5 - 5.2 mmol/L   Chloride 106 96 - 106 mmol/L   CO2 22 20 - 29 mmol/L   Calcium 8.9 8.6 - 10.2 mg/dL   Total Protein 6.0 6.0 - 8.5 g/dL   Albumin 3.8 3.6 - 4.6 g/dL   Globulin, Total 2.2 1.5 - 4.5 g/dL   Albumin/Globulin Ratio 1.7 1.2 - 2.2   Bilirubin Total 0.4 0.0 - 1.2 mg/dL   Alkaline Phosphatase 75 48 - 121 IU/L   AST 19 0 - 40 IU/L   ALT 11 0 - 44 IU/L  Thyroid Panel With TSH  Result Value Ref Range   TSH 1.010 0.450 - 4.500 uIU/mL   T4, Total 6.7 4.5 - 12.0 ug/dL   T3 Uptake Ratio 30 24 - 39 %   Free Thyroxine Index 2.0  1.2 - 4.9  PSA, total and free  Result Value Ref Range   Prostate Specific Ag, Serum 18.4 (H) 0.0 - 4.0 ng/mL   PSA, Free 1.34 N/A ng/mL   PSA, Free Pct 7.3 %   *Note: Due to a large number of results and/or encounters for the requested time period, some results have not been displayed. A complete set of results can be found in Results Review.    Assessment & Plan:   Problem List Items Addressed This Visit      Cardiovascular and  Mediastinum   Afib ([place])     Respiratory   COPD GOLD I     Digestive   Gastroesophageal reflux disease with esophagitis     Endocrine   Hypothyroidism     Other   Hyperlipidemia LDL goal <70 - Primary      Given list of exercises for therapy is to do at home  follow up plan: Return in about 3 months (around 01/17/2020), or if symptoms worsen or fail to improve, for Hypertension and cholesterol and COPD recheck.  Counseling provided for all of the vaccine components No orders of the defined types were placed in this encounter.   Caryl Pina, MD [facility] 10/17/2019, 10:34 AM

## 2019-10-26 ENCOUNTER — Other Ambulatory Visit: Payer: Self-pay | Admitting: Endocrinology

## 2019-10-26 DIAGNOSIS — E221 Hyperprolactinemia: Secondary | ICD-10-CM

## 2019-11-06 DIAGNOSIS — L570 Actinic keratosis: Secondary | ICD-10-CM | POA: Diagnosis not present

## 2019-11-06 DIAGNOSIS — L905 Scar conditions and fibrosis of skin: Secondary | ICD-10-CM | POA: Diagnosis not present

## 2019-11-06 DIAGNOSIS — D485 Neoplasm of uncertain behavior of skin: Secondary | ICD-10-CM | POA: Diagnosis not present

## 2019-11-06 DIAGNOSIS — C44529 Squamous cell carcinoma of skin of other part of trunk: Secondary | ICD-10-CM | POA: Diagnosis not present

## 2019-11-06 DIAGNOSIS — L821 Other seborrheic keratosis: Secondary | ICD-10-CM | POA: Diagnosis not present

## 2019-11-06 DIAGNOSIS — D692 Other nonthrombocytopenic purpura: Secondary | ICD-10-CM | POA: Diagnosis not present

## 2019-11-19 DIAGNOSIS — D045 Carcinoma in situ of skin of trunk: Secondary | ICD-10-CM | POA: Diagnosis not present

## 2019-11-19 DIAGNOSIS — L570 Actinic keratosis: Secondary | ICD-10-CM | POA: Diagnosis not present

## 2019-11-20 ENCOUNTER — Other Ambulatory Visit: Payer: Self-pay | Admitting: Family Medicine

## 2019-12-26 DIAGNOSIS — H905 Unspecified sensorineural hearing loss: Secondary | ICD-10-CM | POA: Diagnosis not present

## 2019-12-31 NOTE — Telephone Encounter (Signed)
PSA was checked due to hx of BPH and elevated PSA. Any PSA over 10 in an 86 yo is abnormal and needs to be evaluated. Have him come in in the next month to have his PSA rechecked

## 2020-01-07 ENCOUNTER — Other Ambulatory Visit: Payer: Self-pay | Admitting: Endocrinology

## 2020-01-07 ENCOUNTER — Other Ambulatory Visit: Payer: Self-pay

## 2020-01-07 DIAGNOSIS — R972 Elevated prostate specific antigen [PSA]: Secondary | ICD-10-CM

## 2020-01-07 DIAGNOSIS — E221 Hyperprolactinemia: Secondary | ICD-10-CM

## 2020-01-08 NOTE — Telephone Encounter (Signed)
Nothing else to offer at this point  Needs ov with all meds /flutter in hand > [facility] or [facility]

## 2020-01-08 NOTE — Telephone Encounter (Signed)
Forwarded Dr. Gustavus Bryant recommendations to pt. Nothing further needed at this time.

## 2020-01-08 NOTE — Telephone Encounter (Signed)
Dr. Melvyn Novas please advise on the following MyChart message. Thank you   I received the Moderna second shot on 05-10-19. I wish your thought/suggestion about taking the booster shot.. As confirmation my lung secretion continues about the same, definitely no better,  in the necessity to use the flutter value. The secretion color remains the same being greyish to brown. At times when I may fail to use the flutter valve for several hours I begin to have the pneumonia type feeling. Thus the use of the flutter value with related expelling relieves the feeling. Kind regards,  [name]  dob 25-May-1932 Ph. [phone]

## 2020-01-14 ENCOUNTER — Other Ambulatory Visit: Payer: Self-pay

## 2020-01-14 ENCOUNTER — Encounter: Payer: Self-pay | Admitting: Endocrinology

## 2020-01-14 ENCOUNTER — Ambulatory Visit: Payer: Medicare Other | Admitting: Endocrinology

## 2020-01-14 VITALS — BP 120/64 | HR 65 | Ht 63.0 in | Wt 157.5 lb

## 2020-01-14 DIAGNOSIS — D352 Benign neoplasm of pituitary gland: Secondary | ICD-10-CM

## 2020-01-14 DIAGNOSIS — D353 Benign neoplasm of craniopharyngeal duct: Secondary | ICD-10-CM

## 2020-01-14 LAB — CORTISOL: Cortisol, Plasma: 8.7 ug/dL

## 2020-01-14 LAB — FOLLICLE STIMULATING HORMONE: FSH: 6.7 m[IU]/mL (ref 1.4–18.1)

## 2020-01-14 LAB — TSH: TSH: 0.7 u[IU]/mL (ref 0.35–4.50)

## 2020-01-14 LAB — T4, FREE: Free T4: 1.26 ng/dL (ref 0.60–1.60)

## 2020-01-14 LAB — LUTEINIZING HORMONE: LH: 6.59 m[IU]/mL (ref 3.10–34.60)

## 2020-01-14 NOTE — Patient Instructions (Signed)
Blood tests are requested for you today.  We'll let you know about the results.   We can skip the MRI this year.   Please come back for a follow-up appointment in 6 months.

## 2020-01-14 NOTE — Progress Notes (Signed)
Subjective:    Patient ID: [name], male    DOB: June 22, 1932, 86 y.o.   MRN: [id number]  HPI Pt returns for f/u of: pituitary macroadenoma (found incidentally on MRI of the C-spine in 2009; he sees Dr Carloyn Manner for this; last MRI in 2020 was unchanged, but he had a rash after Gd infusion):  denies headache.  hyperprolactinemia: (dx'ed 2009; prob due to stalk compression; he was rx'ed parlodel, but dosage has been limited by dizziness; he also had this at minimal dosage of cabergoline).  Lightheadedness persists, but it is mild and intermittent.   Chronic primary hypothyroidism:   He takes synthroid as rx'ed.   Past Medical History:  Diagnosis Date  . Abnormality of gait 06/07/2013  . Anemia   . Aneurysm ([place])    on assending aorta, currently watching it, Dr Cyndia Bent  . Anxiety   . Arthritis   . Asthma   . Atrial fibrillation ([place])   . Barrett's esophagus   . CAD (coronary artery disease) 03/2006   3.0 x 20 mm TAXUS Perseus DES to the LAD; 01/2007  L main 30%, oLAD 50%, pLAD stent ok, CFX 80%, OM 60%, pRCA 60%, mRCA 70%, oPDA 90%; med rx   . Cancer ([place])    skin cancer   . Cataract    bilateral removal of cateracts  . Complication of anesthesia     no issues,but pt prefers spinal due to Pulmonary problems  . COPD (chronic obstructive pulmonary disease) ([place])    oxygen  on standby in home.  . Depression   . Diverticulosis   . Emphysema   . Enlarged prostate with urinary retention   . Esophageal stenosis   . GERD (gastroesophageal reflux disease)   . Hiatal hernia   . Hypothyroidism   . Neuropathy   . Osteoporosis   . Pituitary macroadenoma ([place])   . Restless legs syndrome (RLS)   . UGIB (upper gastrointestinal bleed) 03/2013   EGD w/ large ulcer at GE junction    Past Surgical History:  Procedure Laterality Date  . ABDOMINAL HERNIA REPAIR   2008  . CARDIAC CATHETERIZATION  01/2007   L main 30%, oLAD 50%,  pLAD stent ok, CFX 80%, OM 60%, pRCA 60%, mRCA 70%, oPDA 90%;  med rx  . cataract extraction both eyes    . COLONOSCOPY    . CORONARY STENT PLACEMENT  03/2006   3.0 x 20 mm TAXUS Perseus DES to the LAD  . CYSTOSCOPY N/A 06/10/2015   Procedure: CYSTOSCOPY FULGRATION OF BLEEDING,  electovapor resection;  Surgeon: Irine Seal, MD;  Location: [facility];  Service: Urology;  Laterality: N/A;  . CYSTOSCOPY WITH INSERTION OF UROLIFT N/A 06/01/2015   Procedure: CYSTOSCOPY WITH INSERTION OF UROLIFT x4;  Surgeon: Irine Seal, MD;  Location: [facility];  Service: Urology;  Laterality: N/A;  . ESOPHAGOGASTRODUODENOSCOPY N/A 04/20/2013   Procedure: ESOPHAGOGASTRODUODENOSCOPY (EGD);  Surgeon: Inda Castle, MD;  Location: [facility];  Service: Endoscopy;  Laterality: N/A;  . ESOPHAGOGASTRODUODENOSCOPY N/A 03/25/2016   Procedure: ESOPHAGOGASTRODUODENOSCOPY (EGD);  Surgeon: Irene Shipper, MD;  Location: [facility];  Service: Endoscopy;  Laterality: N/A;  . ESOPHAGOGASTRODUODENOSCOPY (EGD) WITH PROPOFOL N/A 10/13/2015   Procedure: ESOPHAGOGASTRODUODENOSCOPY (EGD) WITH PROPOFOL;  Surgeon: Jerene Bears, MD;  Location: [facility];  Service: Gastroenterology;  Laterality: N/A;  . FOOT SURGERY  1994 left, 2002 right foot   bilateral foot reconstruciton  . FOOT SURGERY     reconstruction of both feet- no retained hardware.  Marland Kitchen  HIATAL HERNIA REPAIR  01-04-2008  . MELANOMA EXCISION  2019  . POLYPECTOMY    . PROSTATE SURGERY     x 2  . SAVORY DILATION N/A 03/25/2016   Procedure: SAVORY DILATION;  Surgeon: Irene Shipper, MD;  Location: [facility];  Service: Endoscopy;  Laterality: N/A;  . TOTAL HIP ARTHROPLASTY  07/21/2011   Procedure: TOTAL HIP ARTHROPLASTY ANTERIOR APPROACH;  Surgeon: Mauri Pole, MD;  Location: [facility];  Service: Orthopedics;  Laterality: Left;  . TOTAL HIP ARTHROPLASTY Right 09/07/2012   Procedure: RIGHT TOTAL HIP ARTHROPLASTY ANTERIOR APPROACH;  Surgeon: Mcarthur Rossetti, MD;  Location: [facility];  Service: Orthopedics;  Laterality: Right;  . TRANSURETHRAL  RESECTION OF BLADDER NECK N/A 11/04/2015   Procedure: RESECTION OF BLADDER NECK;  Surgeon: Cleon Gustin, MD;  Location: [facility];  Service: Urology;  Laterality: N/A;  . TRANSURETHRAL RESECTION OF PROSTATE N/A 11/04/2015   Procedure: TRANSURETHRAL RESECTION OF THE PROSTATE (TURP); REMOVAL OF UROLIFT IMPLANTS X THREE;  Surgeon: Cleon Gustin, MD;  Location: [facility];  Service: Urology;  Laterality: N/A;  . UPPER GASTROINTESTINAL ENDOSCOPY  12/2013   Dr Hilarie Fredrickson, gastritis  . VIDEO BRONCHOSCOPY Bilateral 02/01/2017   Procedure: VIDEO BRONCHOSCOPY WITHOUT FLUORO;  Surgeon: Tanda Rockers, MD;  Location: [facility];  Service: Cardiopulmonary;  Laterality: Bilateral;    Social History   Socioeconomic History  . Marital status: Widowed    Spouse name: Not on file  . Number of children: 2  . Years of education: 80  . Highest education level: Associate degree: occupational, Hotel manager, or vocational program  Occupational History  . Occupation: retired  Tobacco Use  . Smoking status: Never Smoker  . Smokeless tobacco: Never Used  Vaping Use  . Vaping Use: Never used  Substance and Sexual Activity  . Alcohol use: No  . Drug use: No  . Sexual activity: Not Currently    Birth control/protection: None  Other Topics Concern  . Not on file  Social History Narrative   Patient drinks caffeine a few times a week.   Patient is right handed.   Social Determinants of Health   Financial Resource Strain:   . Difficulty of Paying Living Expenses: Not on file  Food Insecurity:   . Worried About Charity fundraiser in the Last Year: Not on file  . Ran Out of Food in the Last Year: Not on file  Transportation Needs:   . Lack of Transportation (Medical): Not on file  . Lack of Transportation (Non-Medical): Not on file  Physical Activity:   . Days of Exercise per Week: Not on file  . Minutes of Exercise per Session: Not on file  Stress:   . Feeling of Stress : Not on file  Social Connections:    . Frequency of Communication with Friends and Family: Not on file  . Frequency of Social Gatherings with Friends and Family: Not on file  . Attends Religious Services: Not on file  . Active Member of Clubs or Organizations: Not on file  . Attends Archivist Meetings: Not on file  . Marital Status: Not on file  Intimate Partner Violence:   . Fear of Current or Ex-Partner: Not on file  . Emotionally Abused: Not on file  . Physically Abused: Not on file  . Sexually Abused: Not on file    Current Outpatient Medications on File Prior to Visit  Medication Sig Dispense Refill  . albuterol (PROVENTIL HFA;VENTOLIN HFA) 108 (90  Base) MCG/ACT inhaler Inhale 2 puffs into the lungs every 6 (six) hours as needed for wheezing or shortness of breath. 18 g 2  . bisacodyl (DULCOLAX) 10 MG suppository Place 10 mg rectally as needed for moderate constipation.    . bromocriptine (PARLODEL) 2.5 MG tablet TAKE  (1)  TABLET TWICE A DAY. 60 tablet 0  . busPIRone (BUSPAR) 15 MG tablet Take 1 tablet (15 mg total) by mouth 2 (two) times daily as needed. (Patient taking differently: Take 7.5 mg by mouth daily. ) 60 tablet 0  . calcium carbonate (OSCAL) 1500 (600 Ca) MG TABS tablet Take 600 mg of elemental calcium by mouth daily with breakfast.     . cholecalciferol (VITAMIN D) 1000 UNITS tablet Take 1,000 Units by mouth daily.     . diclofenac Sodium (VOLTAREN) 1 % GEL Apply 2 g topically 4 (four) times daily. 50 g 1  . ferrous sulfate 325 (65 FE) MG tablet Take 1 tablet (325 mg total) by mouth daily with breakfast. 90 tablet 3  . GAVILAX 17 GM/SCOOP powder DISSOLVE 17 GM SCOOP IN 8 OZ. OF WATER AND DRINK ONCE DAILY AS NEEDED FOR CONSTIPATION 510 g 0  . guaiFENesin (MUCINEX) 600 MG 12 hr tablet Take 600 mg by mouth daily.     Marland Kitchen levalbuterol (XOPENEX) 0.63 MG/3ML nebulizer solution Take 3 mLs (0.63 mg total) by nebulization every 6 (six) hours as needed for wheezing or shortness of breath. 72 mL 1  .  levothyroxine (SYNTHROID) 75 MCG tablet Take 1 tablet (75 mcg total) by mouth daily. 90 tablet 3  . MAGNESIUM CITRATE PO Take by mouth daily.    . montelukast (SINGULAIR) 10 MG tablet     . naphazoline-pheniramine (NAPHCON-A) 0.025-0.3 % ophthalmic solution Place 1 drop into both eyes 2 (two) times daily as needed for irritation or allergies.     . nitroGLYCERIN (NITROSTAT) 0.4 MG SL tablet Place 1 tablet (0.4 mg total) under the tongue every 5 (five) minutes as needed for chest pain. 25 tablet 2  . pantoprazole (PROTONIX) 40 MG tablet 1 - 2 tablets daily 180 tablet 3  . Respiratory Therapy Supplies (FLUTTER) DEVI Twice a day and prn as needed, may increase if feeling worse 1 each 0  . sucralfate (CARAFATE) 1 g tablet Take 1 tablet three times daily as needed 270 tablet 3  . SYMBICORT 160-4.5 MCG/ACT inhaler INHALE 2 PUFFS 2 TIMES A DAY 10.2 g 5  . tamsulosin (FLOMAX) 0.4 MG CAPS capsule TAKE (1) CAPSULE DAILY 30 capsule 1  . tobramycin-dexamethasone (TOBRADEX) ophthalmic solution Apply 1 drop in affected eye(s) every 2 hours for two days. Then every 4 hours for 5 days. 5 mL 0  . triamcinolone cream (KENALOG) 0.1 % Apply 1 application topically 2 (two) times daily. 30 g 0  . vitamin B-12 (CYANOCOBALAMIN) 1000 MCG tablet Take 1,000 mcg by mouth daily.    . vitamin E (VITAMIN E) 400 UNIT capsule Take 400 Units by mouth daily.     No current facility-administered medications on file prior to visit.    Allergies  Allergen Reactions  . Betadine [Povidone Iodine] Other (See Comments)    Reaction:  Blisters   . Gadavist [Gadobutrol]     hives  . Lortab [Hydrocodone-Acetaminophen] Nausea And Vomiting  . Penicillins Other (See Comments)    Reaction:  Lightheadedness  Has patient had a PCN reaction causing immediate rash, facial/tongue/throat swelling, SOB or lightheadedness with hypotension: Yes Has patient had a PCN reaction  causing severe rash involving mucus membranes or skin necrosis: No Has  patient had a PCN reaction that required hospitalization No Has patient had a PCN reaction occurring within the last 10 years: No If all of the above answers are "NO", then may proceed with Cephalosporin use.  Marland Kitchen Zetia [Ezetimibe] Other (See Comments)    Reaction:  Muscle weakness   . Zocor [Simvastatin] Nausea Only and Other (See Comments)    Reaction:  Muscle weakness     Family History  Problem Relation Age of Onset  . Heart disease Sister   . Heart failure Father   . Other Mother        phebitis related to Youcef's birth, he was 28 weeks old when she died  . Colon cancer Neg Hx   . Esophageal cancer Neg Hx   . Rectal cancer Neg Hx   . Stomach cancer Neg Hx   . Colon polyps Neg Hx     BP 120/64   Pulse 65   Ht 5\' 3"  (1.6 m)   Wt 157 lb 8 oz (71.4 kg)   SpO2 98%   BMI 27.90 kg/m    Review of Systems Denies recent falls.      Objective:   Physical Exam VITAL SIGNS:  See vs page GENERAL: no distress NECK: There is no palpable thyroid enlargement.  No thyroid nodule is palpable.  No palpable lymphadenopathy at the anterior neck.    Lab Results  Component Value Date   TSH 0.70 01/14/2020   T4TOTAL 6.7 09/24/2019       Assessment & Plan:  Hypothyroidism: well-replaced.  Please continue the same synthroid Hyperprolactinemia: recheck today Pit adenoma: check pit fn tests.

## 2020-01-17 ENCOUNTER — Encounter: Payer: Self-pay | Admitting: Family Medicine

## 2020-01-17 ENCOUNTER — Other Ambulatory Visit: Payer: Self-pay

## 2020-01-17 ENCOUNTER — Ambulatory Visit (INDEPENDENT_AMBULATORY_CARE_PROVIDER_SITE_OTHER): Payer: Medicare Other | Admitting: Family Medicine

## 2020-01-17 VITALS — BP 120/73 | HR 66 | Temp 96.5°F | Ht 63.0 in | Wt 155.0 lb

## 2020-01-17 DIAGNOSIS — K2100 Gastro-esophageal reflux disease with esophagitis, without bleeding: Secondary | ICD-10-CM | POA: Diagnosis not present

## 2020-01-17 DIAGNOSIS — Z23 Encounter for immunization: Secondary | ICD-10-CM

## 2020-01-17 DIAGNOSIS — E785 Hyperlipidemia, unspecified: Secondary | ICD-10-CM

## 2020-01-17 DIAGNOSIS — Z789 Other specified health status: Secondary | ICD-10-CM

## 2020-01-17 DIAGNOSIS — J449 Chronic obstructive pulmonary disease, unspecified: Secondary | ICD-10-CM

## 2020-01-17 LAB — CMP14+EGFR
ALT: 11 IU/L (ref 0–44)
AST: 21 IU/L (ref 0–40)
Albumin/Globulin Ratio: 1.6 (ref 1.2–2.2)
Albumin: 3.8 g/dL (ref 3.6–4.6)
Alkaline Phosphatase: 80 IU/L (ref 44–121)
BUN/Creatinine Ratio: 15 (ref 10–24)
BUN: 17 mg/dL (ref 8–27)
Bilirubin Total: 0.5 mg/dL (ref 0.0–1.2)
CO2: 22 mmol/L (ref 20–29)
Calcium: 8.6 mg/dL (ref 8.6–10.2)
Chloride: 104 mmol/L (ref 96–106)
Creatinine, Ser: 1.11 mg/dL (ref 0.76–1.27)
GFR calc Af Amer: 70 mL/min/{1.73_m2} (ref >59)
GFR calc non Af Amer: 60 mL/min/{1.73_m2} (ref >59)
Globulin, Total: 2.4 g/dL (ref 1.5–4.5)
Glucose: 81 mg/dL (ref 65–99)
Potassium: 4.1 mmol/L (ref 3.5–5.2)
Sodium: 141 mmol/L (ref 134–144)
Total Protein: 6.2 g/dL (ref 6.0–8.5)

## 2020-01-17 LAB — LIPID PANEL
Chol/HDL Ratio: 3.1 ratio (ref 0.0–5.0)
Cholesterol, Total: 172 mg/dL (ref 100–199)
HDL: 56 mg/dL (ref >39)
LDL Chol Calc (NIH): 104 mg/dL — ABNORMAL HIGH (ref 0–99)
Triglycerides: 62 mg/dL (ref 0–149)
VLDL Cholesterol Cal: 12 mg/dL (ref 5–40)

## 2020-01-17 LAB — PROLACTIN: Prolactin: 56.9 ng/mL — ABNORMAL HIGH (ref 2.0–18.0)

## 2020-01-17 LAB — ACTH: C206 ACTH: 23 pg/mL (ref 6–50)

## 2020-01-17 NOTE — Progress Notes (Signed)
BP 120/73   Pulse 66   Temp (!) 96.5 F (35.8 C)   Ht $R'5\' 3"'tN$  (1.6 m)   Wt 155 lb (70.3 kg)   SpO2 99%   BMI 27.46 kg/m    Subjective:   Patient ID: [name], male    DOB: [birth year], 86 y.o.   MRN: [id number]  HPI: [name] is a 86 y.o. male presenting on 01/17/2020 for Medical Management of Chronic Issues, Hyperlipidemia, and Hypothyroidism   HPI Hyperlipidemia Patient is coming in for recheck of his hyperlipidemia. The patient is currently taking no medication currently we are monitoring, had allergies and intolerances to statins and other medicines. They deny any issues with myalgias or history of liver damage from it. They deny any focal numbness or weakness or chest pain.   GERD Patient is currently on pantoprazole.  She denies any major symptoms or abdominal pain or belching or burping. She denies any blood in her stool or lightheadedness or dizziness.   COPD Patient is coming in for COPD recheck today.  He is currently on Symbicort albuterol.  He has a mild chronic cough but denies any major coughing spells or wheezing spells.  He has 7nighttime symptoms per week and 7daytime symptoms per week currently.  Patient sees Dr. Melvyn Novas for his breathing.  He has been doing okay but still uses albuterol every day.  Patient does fight the occasional constipation but he takes over-the-counter stuff for it is satisfied with that.  Relevant past medical, surgical, family and social history reviewed and updated as indicated. Interim medical history since our last visit reviewed. Allergies and medications reviewed and updated.  Review of Systems  Constitutional: Negative for chills and fever.  Eyes: Negative for visual disturbance.  Respiratory: Positive for cough and wheezing. Negative for shortness of breath.   Cardiovascular: Negative for chest pain and leg swelling.  Skin: Negative for rash.  Neurological: Negative for dizziness, weakness and light-headedness.  All  other systems reviewed and are negative.   Per HPI unless specifically indicated above   Allergies as of 01/17/2020      Reactions   Betadine [povidone Iodine] Other (See Comments)   Reaction:  Blisters    Gadavist [gadobutrol]    hives   Lortab [hydrocodone-acetaminophen] Nausea And Vomiting   Penicillins Other (See Comments)   Reaction:  Lightheadedness  Has patient had a PCN reaction causing immediate rash, facial/tongue/throat swelling, SOB or lightheadedness with hypotension: Yes Has patient had a PCN reaction causing severe rash involving mucus membranes or skin necrosis: No Has patient had a PCN reaction that required hospitalization No Has patient had a PCN reaction occurring within the last 10 years: No If all of the above answers are "NO", then may proceed with Cephalosporin use.   Zetia [ezetimibe] Other (See Comments)   Reaction:  Muscle weakness    Zocor [simvastatin] Nausea Only, Other (See Comments)   Reaction:  Muscle weakness       Medication List       Accurate as of January 17, 2020 10:35 AM. If you have any questions, ask your nurse or doctor.        albuterol 108 (90 Base) MCG/ACT inhaler Commonly known as: VENTOLIN HFA Inhale 2 puffs into the lungs every 6 (six) hours as needed for wheezing or shortness of breath.   bisacodyl 10 MG suppository Commonly known as: DULCOLAX Place 10 mg rectally as needed for moderate constipation.   bromocriptine 2.5 MG tablet Commonly known  as: PARLODEL TAKE  (1)  TABLET TWICE A DAY.   busPIRone 15 MG tablet Commonly known as: BUSPAR Take 1 tablet (15 mg total) by mouth 2 (two) times daily as needed. What changed:   how much to take  when to take this   calcium carbonate 1500 (600 Ca) MG Tabs tablet Commonly known as: OSCAL Take 600 mg of elemental calcium by mouth daily with breakfast.   cholecalciferol 1000 units tablet Commonly known as: VITAMIN D Take 1,000 Units by mouth daily.   diclofenac  Sodium 1 % Gel Commonly known as: VOLTAREN Apply 2 g topically 4 (four) times daily.   ferrous sulfate 325 (65 FE) MG tablet Take 1 tablet (325 mg total) by mouth daily with breakfast.   Flutter Devi Twice a day and prn as needed, may increase if feeling worse   GaviLAX 17 GM/SCOOP powder Generic drug: polyethylene glycol powder DISSOLVE 17 GM SCOOP IN 8 OZ. OF WATER AND DRINK ONCE DAILY AS NEEDED FOR CONSTIPATION   guaiFENesin 600 MG 12 hr tablet Commonly known as: MUCINEX Take 600 mg by mouth daily.   levalbuterol 0.63 MG/3ML nebulizer solution Commonly known as: Xopenex Take 3 mLs (0.63 mg total) by nebulization every 6 (six) hours as needed for wheezing or shortness of breath.   levothyroxine 75 MCG tablet Commonly known as: SYNTHROID Take 1 tablet (75 mcg total) by mouth daily.   MAGNESIUM CITRATE PO Take by mouth daily.   montelukast 10 MG tablet Commonly known as: SINGULAIR   naphazoline-pheniramine 0.025-0.3 % ophthalmic solution Commonly known as: NAPHCON-A Place 1 drop into both eyes 2 (two) times daily as needed for irritation or allergies.   nitroGLYCERIN 0.4 MG SL tablet Commonly known as: NITROSTAT Place 1 tablet (0.4 mg total) under the tongue every 5 (five) minutes as needed for chest pain.   pantoprazole 40 MG tablet Commonly known as: PROTONIX 1 - 2 tablets daily   sucralfate 1 g tablet Commonly known as: CARAFATE Take 1 tablet three times daily as needed   Symbicort 160-4.5 MCG/ACT inhaler Generic drug: budesonide-formoterol INHALE 2 PUFFS 2 TIMES A DAY   tamsulosin 0.4 MG Caps capsule Commonly known as: FLOMAX TAKE (1) CAPSULE DAILY   tobramycin-dexamethasone ophthalmic solution Commonly known as: TobraDex Apply 1 drop in affected eye(s) every 2 hours for two days. Then every 4 hours for 5 days.   triamcinolone 0.1 % Commonly known as: KENALOG Apply 1 application topically 2 (two) times daily.   vitamin B-12 1000 MCG tablet Commonly  known as: CYANOCOBALAMIN Take 1,000 mcg by mouth daily.   vitamin E 180 MG (400 UNITS) capsule Generic drug: vitamin E Take 400 Units by mouth daily.        Objective:   BP 120/73   Pulse 66   Temp (!) 96.5 F (35.8 C)   Ht $R'5\' 3"'tD$  (1.6 m)   Wt 155 lb (70.3 kg)   SpO2 99%   BMI 27.46 kg/m   Wt Readings from Last 3 Encounters:  01/17/20 155 lb (70.3 kg)  01/14/20 157 lb 8 oz (71.4 kg)  10/17/19 152 lb 12.8 oz (69.3 kg)    Physical Exam Vitals and nursing note reviewed.  Constitutional:      General: He is not in acute distress.    Appearance: He is well-developed. He is not diaphoretic.  Eyes:     General: No scleral icterus.    Conjunctiva/sclera: Conjunctivae normal.  Neck:     Thyroid: No thyromegaly.  Cardiovascular:     Rate and Rhythm: Normal rate and regular rhythm.     Heart sounds: Normal heart sounds. No murmur heard.   Pulmonary:     Effort: Pulmonary effort is normal. No respiratory distress.     Breath sounds: Normal breath sounds. No wheezing.  Musculoskeletal:        General: Normal range of motion.     Cervical back: Neck supple.  Lymphadenopathy:     Cervical: No cervical adenopathy.  Skin:    General: Skin is warm and dry.     Findings: No rash.  Neurological:     Mental Status: He is alert and oriented to person, place, and time.     Coordination: Coordination normal.  Psychiatric:        Behavior: Behavior normal.       Assessment & Plan:   Problem List Items Addressed This Visit      Respiratory   COPD GOLD I     Digestive   Gastroesophageal reflux disease with esophagitis     Other   Hyperlipidemia LDL goal <70 - Primary   Relevant Orders   CMP14+EGFR   Lipid panel    Other Visit Diagnoses    Need for immunization against influenza       Relevant Orders   Flu Vaccine QUAD High Dose(Fluad) (Completed)   Statin intolerance          No change in medication, continue to follow-up with the pulmonology for his breathing  issues. Follow up plan: Return in about 4 months (around 05/16/2020), or if symptoms worsen or fail to improve, for COPD and hyperlipidemia and GERD.  Counseling provided for all of the vaccine components Orders Placed This Encounter  Procedures  . Flu Vaccine QUAD High Dose(Fluad)    Caryl Pina, MD [facility] 01/17/2020, 10:35 AM

## 2020-01-27 ENCOUNTER — Encounter: Payer: Self-pay | Admitting: Family Medicine

## 2020-01-28 ENCOUNTER — Other Ambulatory Visit: Payer: Self-pay

## 2020-01-28 ENCOUNTER — Encounter: Payer: Self-pay | Admitting: Pharmacist

## 2020-01-28 ENCOUNTER — Ambulatory Visit (INDEPENDENT_AMBULATORY_CARE_PROVIDER_SITE_OTHER): Payer: Medicare Other | Admitting: Pharmacist

## 2020-01-28 VITALS — BP 120/64

## 2020-01-28 DIAGNOSIS — J449 Chronic obstructive pulmonary disease, unspecified: Secondary | ICD-10-CM

## 2020-01-28 DIAGNOSIS — R413 Other amnesia: Secondary | ICD-10-CM

## 2020-01-28 NOTE — Progress Notes (Signed)
01/28/2020 Name: [name] MRN: [id number] DOB: [birth year]   HPI:  [name] is a 86 y.o. male patient referred for medication management by PCP.   Patient states he is forgetful at times with his medication.  He uses a pill box to manage his medications. PMH is significant for    Past Medical History:  Diagnosis Date  . Abnormality of gait 06/07/2013  . Anemia   . Aneurysm ([place])    on assending aorta, currently watching it, Dr Cyndia Bent  . Anxiety   . Arthritis   . Asthma   . Atrial fibrillation ([place])   . Barrett's esophagus   . CAD (coronary artery disease) 03/2006   3.0 x 20 mm TAXUS [place] DES to the LAD; 01/2007  L main 30%, oLAD 50%, pLAD stent ok, CFX 80%, OM 60%, pRCA 60%, mRCA 70%, oPDA 90%; med rx   . Cancer ([place])    skin cancer   . Cataract    bilateral removal of cateracts  . Complication of anesthesia     no issues,but pt prefers spinal due to Pulmonary problems  . COPD (chronic obstructive pulmonary disease) ([place])    oxygen  on standby in home.  . Depression   . Diverticulosis   . Emphysema   . Enlarged prostate with urinary retention   . Esophageal stenosis   . GERD (gastroesophageal reflux disease)   . Hiatal hernia   . Hypothyroidism   . Neuropathy   . Osteoporosis   . Pituitary macroadenoma ([place])   . Restless legs syndrome (RLS)   . UGIB (upper gastrointestinal bleed) 03/2013   EGD w/ large ulcer at GE junction       Current Outpatient Medications on File Prior to Visit  Medication Sig Dispense Refill  . albuterol (PROVENTIL HFA;VENTOLIN HFA) 108 (90 Base) MCG/ACT inhaler Inhale 2 puffs into the lungs every 6 (six) hours as needed for wheezing or shortness of breath. 18 g 2  . bisacodyl (DULCOLAX) 10 MG suppository Place 10 mg rectally as needed for moderate constipation.    . bromocriptine (PARLODEL) 2.5 MG tablet TAKE  (1)  TABLET TWICE A DAY. 60 tablet 0  . busPIRone (BUSPAR) 15 MG tablet Take 1 tablet (15 mg total) by mouth 2  (two) times daily as needed. (Patient taking differently: Take 7.5 mg by mouth daily. ) 60 tablet 0  . calcium carbonate (OSCAL) 1500 (600 Ca) MG TABS tablet Take 600 mg of elemental calcium by mouth daily with breakfast.     . cholecalciferol (VITAMIN D) 1000 UNITS tablet Take 1,000 Units by mouth daily.     . diclofenac Sodium (VOLTAREN) 1 % GEL Apply 2 g topically 4 (four) times daily. 50 g 1  . ferrous sulfate 325 (65 FE) MG tablet Take 1 tablet (325 mg total) by mouth daily with breakfast. 90 tablet 3  . GAVILAX 17 GM/SCOOP powder DISSOLVE 17 GM SCOOP IN 8 OZ. OF WATER AND DRINK ONCE DAILY AS NEEDED FOR CONSTIPATION 510 g 0  . guaiFENesin (MUCINEX) 600 MG 12 hr tablet Take 600 mg by mouth daily.     Marland Kitchen levalbuterol (XOPENEX) 0.63 MG/3ML nebulizer solution Take 3 mLs (0.63 mg total) by nebulization every 6 (six) hours as needed for wheezing or shortness of breath. 72 mL 1  . levothyroxine (SYNTHROID) 75 MCG tablet Take 1 tablet (75 mcg total) by mouth daily. 90 tablet 3  . MAGNESIUM CITRATE PO Take by mouth daily.    Marland Kitchen  montelukast (SINGULAIR) 10 MG tablet     . naphazoline-pheniramine (NAPHCON-A) 0.025-0.3 % ophthalmic solution Place 1 drop into both eyes 2 (two) times daily as needed for irritation or allergies.     . nitroGLYCERIN (NITROSTAT) 0.4 MG SL tablet Place 1 tablet (0.4 mg total) under the tongue every 5 (five) minutes as needed for chest pain. 25 tablet 2  . pantoprazole (PROTONIX) 40 MG tablet 1 - 2 tablets daily 180 tablet 3  . Respiratory Therapy Supplies (FLUTTER) DEVI Twice a day and prn as needed, may increase if feeling worse 1 each 0  . sucralfate (CARAFATE) 1 g tablet Take 1 tablet three times daily as needed 270 tablet 3  . SYMBICORT 160-4.5 MCG/ACT inhaler INHALE 2 PUFFS 2 TIMES A DAY 10.2 g 5  . tamsulosin (FLOMAX) 0.4 MG CAPS capsule TAKE (1) CAPSULE DAILY 30 capsule 1  . tobramycin-dexamethasone (TOBRADEX) ophthalmic solution Apply 1 drop in affected eye(s) every 2  hours for two days. Then every 4 hours for 5 days. 5 mL 0  . triamcinolone cream (KENALOG) 0.1 % Apply 1 application topically 2 (two) times daily. 30 g 0  . vitamin B-12 (CYANOCOBALAMIN) 1000 MCG tablet Take 1,000 mcg by mouth daily.    . vitamin E (VITAMIN E) 400 UNIT capsule Take 400 Units by mouth daily.     No current facility-administered medications on file prior to visit.       Allergies  Allergen Reactions  . Betadine [Povidone Iodine] Other (See Comments)    Reaction:  Blisters   . Gadavist [Gadobutrol]     hives  . Lortab [Hydrocodone-Acetaminophen] Nausea And Vomiting  . Penicillins Other (See Comments)    Reaction:  Lightheadedness  Has patient had a PCN reaction causing immediate rash, facial/tongue/throat swelling, SOB or lightheadedness with hypotension: Yes Has patient had a PCN reaction causing severe rash involving mucus membranes or skin necrosis: No Has patient had a PCN reaction that required hospitalization No Has patient had a PCN reaction occurring within the last 10 years: No If all of the above answers are "NO", then may proceed with Cephalosporin use.  Marland Kitchen Zetia [Ezetimibe] Other (See Comments)    Reaction:  Muscle weakness   . Zocor [Simvastatin] Nausea Only and Other (See Comments)    Reaction:  Muscle weakness        Assessment/Plan:     1. Medication Management  -Medications reviewed in depth; patient verbalized understanding on medications, he was very aware of medications and indications  Encouraged continued use of pill box -Patient's med list is filled with many PRN and OTC medications.  He would like to leave these on his list if needed in the future -Counseled on importance of separating levothyroxine  consider administering levothyroxine several hours before the first daily dose of sucralfate.  Separate the oral administration of iron preparations and levothyroxine by at least 4 hours  Separate the doses of the thyroid product and the  oral calcium supplement by at least 4 hours  -Recommend Claritin for allergies -Breo ellipta given in place of symbicort due to financial stress --PTW#656C, EXP 7/22  Patient counseled on use -Cerave given for dry, itchy skin   Regina Eck, PharmD, BCPS Clinical Pharmacist, [place]  [facility] Phone [phone]

## 2020-02-03 ENCOUNTER — Other Ambulatory Visit: Payer: Medicare Other

## 2020-02-03 ENCOUNTER — Other Ambulatory Visit: Payer: Self-pay

## 2020-02-04 LAB — PSA: Prostate Specific Ag, Serum: 18.2 ng/mL — ABNORMAL HIGH (ref 0.0–4.0)

## 2020-02-07 ENCOUNTER — Ambulatory Visit (INDEPENDENT_AMBULATORY_CARE_PROVIDER_SITE_OTHER): Payer: Medicare Other | Admitting: *Deleted

## 2020-02-07 VITALS — Ht 63.0 in | Wt 155.0 lb

## 2020-02-07 DIAGNOSIS — Z0000 Encounter for general adult medical examination without abnormal findings: Secondary | ICD-10-CM

## 2020-02-07 NOTE — Progress Notes (Signed)
MEDICARE ANNUAL WELLNESS VISIT  02/07/2020  Telephone Visit Disclaimer This Medicare AWV was conducted by telephone due to national recommendations for restrictions regarding the COVID-19 Pandemic (e.g. social distancing).  I verified, using two identifiers, that I am speaking with [name] or their authorized healthcare agent. I discussed the limitations, risks, security, and privacy concerns of performing an evaluation and management service by telephone and the potential availability of an in-person appointment in the future. The patient expressed understanding and agreed to proceed.  Location of Patient: Home  Location of Provider (nurse): Office  Subjective:    [name] is a 86 y.o. male [name], [name], MD who had a Medicare Annual Wellness Visit today via telephone. [name] is Retired and lives alone. he has 2 children. he reports that he is socially active and does interact with friends/family regularly. he is minimally physically active and enjoys puzzles.  Patient Care Team: Dettinger, [name], MD as PCP - General (Family Medicine) Minus Breeding, MD as PCP - Cardiology (Cardiology) Renato Shin, MD as Consulting Physician (Endocrinology) Glenna Fellows, MD as Attending Physician (Neurosurgery) Tanda Rockers, MD as Consulting Physician (Pulmonary Disease) Gaye Pollack, MD as Consulting Physician (Cardiothoracic Surgery) Cleon Gustin, MD as Consulting Physician (Urology)  Advanced Directives 02/07/2020 12/28/2018 12/26/2017 08/16/2017 02/01/2017 06/28/2016 04/26/2016  Does Patient Have a Medical Advance Directive? Yes Yes Yes Yes Yes Yes Yes  Type of Paramedic of [place];Living will [place];Living will [place];Living will [place];Living will Living will;Healthcare Power of [place];Living will [place];Living  will  Does patient want to make changes to medical advance directive? No - Patient declined No - Patient declined No - Patient declined - - - Yes (Inpatient - patient requests chaplain consult to change a medical advance directive)  Copy of [place] in Chart? Yes - validated most recent copy scanned in chart (See row information) No - copy requested Yes - - No - copy requested -  Would patient like information on creating a medical advance directive? - - - - - - -  Pre-existing out of facility DNR order (yellow form or pink MOST form) - - - - - - -    Hospital Utilization Over the Past 12 Months: # of hospitalizations or ER visits: 0 # of surgeries: 0  Review of Systems    Patient reports that his overall health is unchanged compared to last year.  History obtained from chart review and the patient General ROS: negative  Patient Reported Readings (BP, Pulse, CBG, Weight, etc) none  Pain Assessment Pain : No/denies pain     Current Medications & Allergies (verified) Allergies as of 02/07/2020      Reactions   Betadine [povidone Iodine] Other (See Comments)   Reaction:  Blisters    Gadavist [gadobutrol]    hives   Lortab [hydrocodone-acetaminophen] Nausea And Vomiting   Penicillins Other (See Comments)   Reaction:  Lightheadedness  Has patient had a PCN reaction causing immediate rash, facial/tongue/throat swelling, SOB or lightheadedness with hypotension: Yes Has patient had a PCN reaction causing severe rash involving mucus membranes or skin necrosis: No Has patient had a PCN reaction that required hospitalization No Has patient had a PCN reaction occurring within the last 10 years: No If all of the above answers are "NO", then may proceed with Cephalosporin use.   Zetia [  ezetimibe] Other (See Comments)   Reaction:  Muscle weakness    Zocor [simvastatin] Nausea Only, Other (See Comments)   Reaction:  Muscle weakness       Medication List        Accurate as of February 07, 2020 11:42 AM. If you have any questions, ask your nurse or doctor.        albuterol 108 (90 Base) MCG/ACT inhaler Commonly known as: VENTOLIN HFA Inhale 2 puffs into the lungs every 6 (six) hours as needed for wheezing or shortness of breath.   bisacodyl 10 MG suppository Commonly known as: DULCOLAX Place 10 mg rectally as needed for moderate constipation.   bromocriptine 2.5 MG tablet Commonly known as: PARLODEL TAKE  (1)  TABLET TWICE A DAY.   busPIRone 15 MG tablet Commonly known as: BUSPAR Take 1 tablet (15 mg total) by mouth 2 (two) times daily as needed. What changed:   how much to take  when to take this   calcium carbonate 1500 (600 Ca) MG Tabs tablet Commonly known as: OSCAL Take 600 mg of elemental calcium by mouth daily with breakfast.   cholecalciferol 1000 units tablet Commonly known as: VITAMIN D Take 1,000 Units by mouth daily.   diclofenac Sodium 1 % Gel Commonly known as: VOLTAREN Apply 2 g topically 4 (four) times daily.   ferrous sulfate 325 (65 FE) MG tablet Take 1 tablet (325 mg total) by mouth daily with breakfast.   Flutter Devi Twice a day and prn as needed, may increase if feeling worse   GaviLAX 17 GM/SCOOP powder Generic drug: polyethylene glycol powder DISSOLVE 17 GM SCOOP IN 8 OZ. OF WATER AND DRINK ONCE DAILY AS NEEDED FOR CONSTIPATION   guaiFENesin 600 MG 12 hr tablet Commonly known as: MUCINEX Take 600 mg by mouth daily.   levalbuterol 0.63 MG/3ML nebulizer solution Commonly known as: Xopenex Take 3 mLs (0.63 mg total) by nebulization every 6 (six) hours as needed for wheezing or shortness of breath.   levothyroxine 75 MCG tablet Commonly known as: SYNTHROID Take 1 tablet (75 mcg total) by mouth daily.   MAGNESIUM CITRATE PO Take by mouth daily.   montelukast 10 MG tablet Commonly known as: SINGULAIR   naphazoline-pheniramine 0.025-0.3 % ophthalmic solution Commonly known as:  NAPHCON-A Place 1 drop into both eyes 2 (two) times daily as needed for irritation or allergies.   nitroGLYCERIN 0.4 MG SL tablet Commonly known as: NITROSTAT Place 1 tablet (0.4 mg total) under the tongue every 5 (five) minutes as needed for chest pain.   pantoprazole 40 MG tablet Commonly known as: PROTONIX 1 - 2 tablets daily   sucralfate 1 g tablet Commonly known as: CARAFATE Take 1 tablet three times daily as needed   Symbicort 160-4.5 MCG/ACT inhaler Generic drug: budesonide-formoterol INHALE 2 PUFFS 2 TIMES A DAY   tamsulosin 0.4 MG Caps capsule Commonly known as: FLOMAX TAKE (1) CAPSULE DAILY   tobramycin-dexamethasone ophthalmic solution Commonly known as: TobraDex Apply 1 drop in affected eye(s) every 2 hours for two days. Then every 4 hours for 5 days.   triamcinolone 0.1 % Commonly known as: KENALOG Apply 1 application topically 2 (two) times daily.   vitamin B-12 1000 MCG tablet Commonly known as: CYANOCOBALAMIN Take 1,000 mcg by mouth daily.   vitamin E 180 MG (400 UNITS) capsule Take 400 Units by mouth daily.       History (reviewed): Past Medical History:  Diagnosis Date  . Abnormality of gait 06/07/2013  .  Anemia   . Aneurysm ([place])    on assending aorta, currently watching it, Dr Cyndia Bent  . Anxiety   . Arthritis   . Asthma   . Atrial fibrillation ([place])   . Barrett's esophagus   . CAD (coronary artery disease) 03/2006   3.0 x 20 mm TAXUS Perseus DES to the LAD; 01/2007  L main 30%, oLAD 50%, pLAD stent ok, CFX 80%, OM 60%, pRCA 60%, mRCA 70%, oPDA 90%; med rx   . Cancer ([place])    skin cancer   . Cataract    bilateral removal of cateracts  . Complication of anesthesia     no issues,but pt prefers spinal due to Pulmonary problems  . COPD (chronic obstructive pulmonary disease) ([place])    oxygen  on standby in home.  . Depression   . Diverticulosis   . Emphysema   . Enlarged prostate with urinary retention   . Esophageal stenosis   . GERD  (gastroesophageal reflux disease)   . Hiatal hernia   . Hypothyroidism   . Neuropathy   . Osteoporosis   . Pituitary macroadenoma ([place])   . Restless legs syndrome (RLS)   . UGIB (upper gastrointestinal bleed) 03/2013   EGD w/ large ulcer at GE junction   Past Surgical History:  Procedure Laterality Date  . ABDOMINAL HERNIA REPAIR   2008  . CARDIAC CATHETERIZATION  01/2007   L main 30%, oLAD 50%,  pLAD stent ok, CFX 80%, OM 60%, pRCA 60%, mRCA 70%, oPDA 90%; med rx  . cataract extraction both eyes    . COLONOSCOPY    . CORONARY STENT PLACEMENT  03/2006   3.0 x 20 mm TAXUS Perseus DES to the LAD  . CYSTOSCOPY N/A 06/10/2015   Procedure: CYSTOSCOPY FULGRATION OF BLEEDING,  electovapor resection;  Surgeon: Irine Seal, MD;  Location: [facility];  Service: Urology;  Laterality: N/A;  . CYSTOSCOPY WITH INSERTION OF UROLIFT N/A 06/01/2015   Procedure: CYSTOSCOPY WITH INSERTION OF UROLIFT x4;  Surgeon: Irine Seal, MD;  [facility]: [facility];  Service: Urology;  Laterality: N/A;  . ESOPHAGOGASTRODUODENOSCOPY N/A 04/20/2013   Procedure: ESOPHAGOGASTRODUODENOSCOPY (EGD);  Surgeon: Inda Castle, MD;  [facility];  Service: Endoscopy;  Laterality: N/A;  . ESOPHAGOGASTRODUODENOSCOPY N/A 03/25/2016   Procedure: ESOPHAGOGASTRODUODENOSCOPY (EGD);  Surgeon: Irene Shipper, MD;  Location: [facility];  Service: Endoscopy;  Laterality: N/A;  . ESOPHAGOGASTRODUODENOSCOPY (EGD) WITH PROPOFOL N/A 10/13/2015   Procedure: ESOPHAGOGASTRODUODENOSCOPY (EGD) WITH PROPOFOL;  Surgeon: Jerene Bears, MD;  [facility];  Service: Gastroenterology;  Laterality: N/A;  . FOOT SURGERY  1994 left, 2002 right foot   bilateral foot reconstruciton  . FOOT SURGERY     reconstruction of both feet- no retained hardware.  Marland Kitchen HIATAL HERNIA REPAIR  01-04-2008  . MELANOMA EXCISION  2019  . POLYPECTOMY    . PROSTATE SURGERY     x 2  . SAVORY DILATION N/A 03/25/2016   Procedure: SAVORY DILATION;  Surgeon: Irene Shipper, MD;   [facility];  Service: Endoscopy;  Laterality: N/A;  . TOTAL HIP ARTHROPLASTY  07/21/2011   Procedure: TOTAL HIP ARTHROPLASTY ANTERIOR APPROACH;  Surgeon: Mauri Pole, MD;  [facility];  Service: Orthopedics;  Laterality: Left;  . TOTAL HIP ARTHROPLASTY Right 09/07/2012   Procedure: RIGHT TOTAL HIP ARTHROPLASTY ANTERIOR APPROACH;  Surgeon: Mcarthur Rossetti, MD;  Location: [facility];  Service: Orthopedics;  Laterality: Right;  . TRANSURETHRAL RESECTION OF BLADDER NECK N/A 11/04/2015  Procedure: RESECTION OF BLADDER NECK;  Surgeon: Cleon Gustin, MD;  Location: [facility];  Service: Urology;  Laterality: N/A;  . TRANSURETHRAL RESECTION OF PROSTATE N/A 11/04/2015   Procedure: TRANSURETHRAL RESECTION OF THE PROSTATE (TURP); REMOVAL OF UROLIFT IMPLANTS X THREE;  Surgeon: Cleon Gustin, MD;  Location: [facility];  Service: Urology;  Laterality: N/A;  . UPPER GASTROINTESTINAL ENDOSCOPY  12/2013   Dr Hilarie Fredrickson, gastritis  . VIDEO BRONCHOSCOPY Bilateral 02/01/2017   Procedure: VIDEO BRONCHOSCOPY WITHOUT FLUORO;  Surgeon: Tanda Rockers, MD;  [facility];  Service: Cardiopulmonary;  Laterality: Bilateral;   Family History  Problem Relation Age of Onset  . Heart disease Sister   . Heart failure Father   . Other Mother        phebitis related to Arren's birth, he was 15 weeks old when she died  . Colon cancer Neg Hx   . Esophageal cancer Neg Hx   . Rectal cancer Neg Hx   . Stomach cancer Neg Hx   . Colon polyps Neg Hx    Social History   Socioeconomic History  . Marital status: Widowed    Spouse name: Not on file  . Number of children: 2  . Years of education: 92  . Highest education level: Associate degree: occupational, Hotel manager, or vocational program  Occupational History  . Occupation: retired  Tobacco Use  . Smoking status: Never Smoker  . Smokeless tobacco: Never Used  Vaping Use  . Vaping Use: Never used  Substance and Sexual Activity  . Alcohol use: No   . Drug use: No  . Sexual activity: Not Currently    Birth control/protection: None  Other Topics Concern  . Not on file  Social History Narrative   Patient drinks caffeine a few times a week.   Patient is right handed.   Social Determinants of Health   Financial Resource Strain: Low Risk   . Difficulty of Paying Living Expenses: Not hard at all  Food Insecurity: No Food Insecurity  . Worried About Charity fundraiser in the Last Year: Never true  . Ran Out of Food in the Last Year: Never true  Transportation Needs: No Transportation Needs  . Lack of Transportation (Medical): No  . Lack of Transportation (Non-Medical): No  Physical Activity: Insufficiently Active  . Days of Exercise per Week: 5 days  . Minutes of Exercise per Session: 20 min  Stress: No Stress Concern Present  . Feeling of Stress : Not at all  Social Connections: Moderately Integrated  . Frequency of Communication with Friends and Family: More than three times a week  . Frequency of Social Gatherings with Friends and Family: More than three times a week  . Attends Religious Services: More than 4 times per year  . Active Member of Clubs or Organizations: Yes  . Attends Archivist Meetings: More than 4 times per year  . Marital Status: Widowed    Activities of Daily Living In your present state of health, do you have any difficulty performing the following activities: 02/07/2020  Hearing? Y  Vision? N  Difficulty concentrating or making decisions? N  Walking or climbing stairs? Y  Dressing or bathing? Y  Doing errands, shopping? N  Preparing Food and eating ? N  Using the Toilet? N  In the past six months, have you accidently leaked urine? N  Do you have problems with loss of bowel control? N  Managing your Medications? N  Managing your Finances?  N  Housekeeping or managing your Housekeeping? N  Some recent data might be hidden    Patient Education/ Literacy How often do you need to have  someone help you when you read instructions, pamphlets, or other written materials from your doctor or pharmacy?: 1 - Never What is the last grade level you completed in school?: Some College  Exercise Current Exercise Habits: The patient does not participate in regular exercise at present, Exercise limited by: None identified  Diet Patient reports consuming 3 meals a day and 2 snack(s) a day Patient reports that his primary diet is: Regular Patient reports that she does have regular access to food.   Depression Screen PHQ 2/9 Scores 02/07/2020 02/07/2020 01/17/2020 10/17/2019 09/24/2019 08/27/2019 05/03/2019  PHQ - 2 Score 0 0 0 0 0 0 1  PHQ- 9 Score - - - 1 - - -     Fall Risk Fall Risk  02/07/2020 01/17/2020 10/17/2019 09/24/2019 08/27/2019  Falls in the past year? 1 0 1 0 1  Number falls in past yr: 1 - 1 - 1  Injury with Fall? 0 - 0 - 0  Risk Factor Category  - - - - -  Risk for fall due to : History of fall(s);Impaired balance/gait;Impaired mobility - Impaired balance/gait - Impaired balance/gait  Risk for fall due to: Comment - - - - -  Follow up Falls evaluation completed - - - -     Objective:  [name] seemed alert and oriented and he participated appropriately during our telephone visit.  Blood Pressure Weight BMI  BP Readings from Last 3 Encounters:  01/28/20 120/64  01/17/20 120/73  01/14/20 120/64   Wt Readings from Last 3 Encounters:  02/07/20 154 lb 15.7 oz (70.3 kg)  01/17/20 155 lb (70.3 kg)  01/14/20 157 lb 8 oz (71.4 kg)   BMI Readings from Last 1 Encounters:  02/07/20 27.45 kg/m    *Unable to obtain current vital signs, weight, and BMI due to telephone visit type  Hearing/Vision  . Ahmadou did  seem to have difficulty with hearing/understanding during the telephone conversation . Reports that he has had a formal eye exam by an eye care professional within the past year . Reports that he has had a formal hearing evaluation within the past year *Unable  to fully assess hearing and vision during telephone visit type  Cognitive Function: 6CIT Screen 02/07/2020 12/28/2018 12/26/2017  What Year? 0 points 0 points 0 points  What month? 0 points 0 points 0 points  What time? 0 points 0 points 0 points  Count back from 20 0 points 0 points 0 points  Months in reverse 0 points 4 points 0 points  Repeat phrase 0 points 0 points 0 points  Total Score 0 4 0   (Normal:0-7, Significant for Dysfunction: >8)  Normal Cognitive Function Screening: Yes   Immunization & Health Maintenance Record Immunization History  Administered Date(s) Administered  . Fluad Quad(high Dose 65+) 12/10/2018, 01/17/2020  . Influenza Whole 11/22/2007, 10/29/2009, 10/30/2010, 11/29/2011  . Influenza, High Dose Seasonal PF 12/03/2015, 12/26/2016, 12/27/2017  . Influenza,inj,Quad PF,6+ Mos 12/03/2012, 11/25/2013, 12/19/2014  . Moderna SARS-COVID-2 Vaccination 04/11/2019, 05/10/2019  . Pneumococcal Conjugate-13 01/09/2013  . Pneumococcal Polysaccharide-23 10/29/2009  . Td 04/29/1998  . Tdap 01/15/2019, 01/15/2019  . Zoster 03/31/2006    Health Maintenance  Topic Date Due  . COVID-19 Vaccine (3 - Booster for Moderna series) 11/10/2019  . TETANUS/TDAP  01/14/2029  . INFLUENZA VACCINE  Completed  .  PNA vac Low Risk Adult  Completed       Assessment  This is a routine wellness examination for [name].  Health Maintenance: Due or Overdue Health Maintenance Due  Topic Date Due  . COVID-19 Vaccine (3 - Booster for Moderna series) 11/10/2019    [name] does not need a referral for Community Assistance: Care Management:   no Social Work:    no Prescription Assistance:  no Nutrition/Diabetes Education:  no   Plan:  Personalized Goals Goals Addressed            This Visit's Progress   . Exercise 150 min/wk Moderate Activity       Check with physical therapy to see what activities you can safely do.   02/07/2020 AWV Goal: Exercise for  General Health   Patient will verbalize understanding of the benefits of increased physical activity:  Exercising regularly is important. It will improve your overall fitness, flexibility, and endurance.  Regular exercise also will improve your overall health. It can help you control your weight, reduce stress, and improve your bone density.  Over the next year, patient will increase physical activity as tolerated with a goal of at least 150 minutes of moderate physical activity per week.   You can tell that you are exercising at a moderate intensity if your heart starts beating faster and you start breathing faster but can still hold a conversation.  Moderate-intensity exercise ideas include:  Walking 1 mile (1.6 km) in about 15 minutes  Biking  Hiking  Golfing  Dancing  Water aerobics  Patient will verbalize understanding of everyday activities that increase physical activity by providing examples like the following: ? Yard work, such as: ? Pushing a Conservation officer, nature ? Raking and bagging leaves ? Washing your car ? Pushing a stroller ? Shoveling snow ? Gardening ? Washing windows or floors  Patient will be able to explain general safety guidelines for exercising:   Before you start a new exercise program, talk with your health care provider.  Do not exercise so much that you hurt yourself, feel dizzy, or get very short of breath.  Wear comfortable clothes and wear shoes with good support.  Drink plenty of water while you exercise to prevent dehydration or heat stroke.  Work out until your breathing and your heartbeat get faster.       Personalized Health Maintenance & Screening Recommendations  up to date  Lung Cancer Screening Recommended: no (Low Dose CT Chest recommended if Age 26-80 years, 30 pack-year currently smoking OR have quit w/in past 15 years) Hepatitis C Screening recommended: no HIV Screening recommended: no  Advanced Directives: Written  information was not prepared per patient's request.  Referrals & Orders No orders of the defined types were placed in this encounter.   Follow-up Plan . Follow-up with Dettinger, [name], MD as planned    I have personally reviewed and noted the following in the patient's chart:   . Medical and social history . Use of alcohol, tobacco or illicit drugs  . Current medications and supplements . Functional ability and status . Nutritional status . Physical activity . Advanced directives . List of other physicians . Hospitalizations, surgeries, and ER visits in previous 12 months . Vitals . Screenings to include cognitive, depression, and falls . Referrals and appointments  In addition, I have reviewed and discussed with [name] certain preventive protocols, quality metrics, and best practice recommendations. A written personalized care plan for  preventive services as well as general preventive health recommendations is available and can be mailed to the patient at his request.      Wardell Heath, LPN  [id number]    AVS printed and mailed to patient

## 2020-02-07 NOTE — Patient Instructions (Signed)
  [facility] Maintenance Summary and Written Plan of Care  [name] ,  Thank you for allowing me to perform your Medicare Annual Wellness Visit and for your ongoing commitment to your health.   Health Maintenance & Immunization History Health Maintenance  Topic Date Due  . COVID-19 Vaccine (3 - Booster for Moderna series) 11/10/2019  . TETANUS/TDAP  01/14/2029  . INFLUENZA VACCINE  Completed  . PNA vac Low Risk Adult  Completed   Immunization History  Administered Date(s) Administered  . Fluad Quad(high Dose 65+) 12/10/2018, 01/17/2020  . Influenza Whole 11/22/2007, 10/29/2009, 10/30/2010, 11/29/2011  . Influenza, High Dose Seasonal PF 12/03/2015, 12/26/2016, 12/27/2017  . Influenza,inj,Quad PF,6+ Mos 12/03/2012, 11/25/2013, 12/19/2014  . Moderna SARS-COVID-2 Vaccination 04/11/2019, 05/10/2019  . Pneumococcal Conjugate-13 01/09/2013  . Pneumococcal Polysaccharide-23 10/29/2009  . Td 04/29/1998  . Tdap 01/15/2019, 01/15/2019  . Zoster 03/31/2006    These are the patient goals that we discussed: Goals Addressed            This Visit's Progress   . Exercise 150 min/wk Moderate Activity       Check with physical therapy to see what activities you can safely do.   02/07/2020 AWV Goal: Exercise for General Health   Patient will verbalize understanding of the benefits of increased physical activity:  Exercising regularly is important. It will improve your overall fitness, flexibility, and endurance.  Regular exercise also will improve your overall health. It can help you control your weight, reduce stress, and improve your bone density.  Over the next year, patient will increase physical activity as tolerated with a goal of at least 150 minutes of moderate physical activity per week.   You can tell that you are exercising at a moderate intensity if your heart starts beating faster and you start breathing faster but can still hold a  conversation.  Moderate-intensity exercise ideas include:  Walking 1 mile (1.6 km) in about 15 minutes  Biking  Hiking  Golfing  Dancing  Water aerobics  Patient will verbalize understanding of everyday activities that increase physical activity by providing examples like the following: ? Yard work, such as: ? Pushing a Conservation officer, nature ? Raking and bagging leaves ? Washing your car ? Pushing a stroller ? Shoveling snow ? Gardening ? Washing windows or floors  Patient will be able to explain general safety guidelines for exercising:   Before you start a new exercise program, talk with your health care provider.  Do not exercise so much that you hurt yourself, feel dizzy, or get very short of breath.  Wear comfortable clothes and wear shoes with good support.  Drink plenty of water while you exercise to prevent dehydration or heat stroke.  Work out until your breathing and your heartbeat get faster.         This is a list of Health Maintenance Items that are overdue or due now: Health Maintenance Due  Topic Date Due  . COVID-19 Vaccine (3 - Booster for Moderna series) 11/10/2019     Orders/Referrals Placed Today: No orders of the defined types were placed in this encounter.  (Contact our referral department at [phone] if you have not spoken with someone about your referral appointment within the next 5 days)    Follow-up Plan Follow up with Dr. Warrick Parisian as planned

## 2020-02-11 ENCOUNTER — Telehealth: Payer: Self-pay

## 2020-02-11 NOTE — Telephone Encounter (Signed)
Pt made aware PSA stable.

## 2020-02-11 NOTE — Telephone Encounter (Signed)
-----   Message from Cleon Gustin, MD sent at 02/10/2020  9:05 AM EST ----- PSA stable ----- Message ----- From: Valentina Lucks, LPN Sent: [id number]   9:15 AM EST To: Cleon Gustin, MD  Pls review.

## 2020-02-17 ENCOUNTER — Ambulatory Visit: Payer: Medicare Other | Admitting: Pulmonary Disease

## 2020-02-17 ENCOUNTER — Other Ambulatory Visit: Payer: Self-pay

## 2020-02-17 ENCOUNTER — Encounter: Payer: Self-pay | Admitting: Pulmonary Disease

## 2020-02-17 VITALS — BP 114/60 | HR 68 | Temp 97.3°F | Ht 66.0 in | Wt 159.0 lb

## 2020-02-17 DIAGNOSIS — J471 Bronchiectasis with (acute) exacerbation: Secondary | ICD-10-CM | POA: Diagnosis not present

## 2020-02-17 DIAGNOSIS — J449 Chronic obstructive pulmonary disease, unspecified: Secondary | ICD-10-CM | POA: Diagnosis not present

## 2020-02-17 DIAGNOSIS — R079 Chest pain, unspecified: Secondary | ICD-10-CM | POA: Diagnosis not present

## 2020-02-17 MED ORDER — LEVOFLOXACIN 500 MG PO TABS: 500.0000 mg | ORAL_TABLET | Freq: Every day | ORAL | 0 refills | Status: DC

## 2020-02-17 NOTE — Patient Instructions (Addendum)
Bronchiectasis Exacerbation - Start levaquin 500 mg x 5 days Counseled Covid Vaccination - Discussed benefits and risks of booster vaccination. Discussed proper hand washing and social distancing. Ok to receive after completing antibiotics

## 2020-02-17 NOTE — Progress Notes (Signed)
Subjective:   Patient ID: [name], male    DOB: [birth year]   MRN: [id number]    Brief patient profile:  86   yowm quit smoking completely around [name] (relatively light) with cough and short of breath eval by  Dr Eliberto Ivory  Then [name] dx of copd/ cb  pft's showed fev1 116% 02/2010 though ratio 66 c/w GOLD I criteria with confirmed bronchiectasis in 11/2017      History of Present Illness  07/05/2010 ov cc recurrent pna's since June 2011 cc not back to baseline in terms of activities he enjoyed in May, for example  Could do some yardwork  and rarely  Needed saba and no need for any maint rx,   but on  spriva for sob and still frequent tightness generalized front more than back,  Equal both sides  low grade fever and mucus gets thick and yellow> admit to [facility] April 24-27 by [facility] dx of ? Pna.   No sob at rest.   Continue protonix 40 mg  Take 30-60 min before first meal of the day and Pepcid 20 mg at bedtime GERD (REFLUX) diet Try symbicort 160 Take 2 puffs bid and work on hfa technique     01/31/2017  f/u ov/Wert re:  Copd 1/ chronic bronchitis / ? Prev aspiraton with abnormal cT chest  ? obst RLL  Chief Complaint  Patient presents with  . Follow-up    Pt states he has had "a touch of PNA". He has been coughing since the beginning on Nov 2018.  He has taken doxy and levaquin.  He is coughing up greyish white sputum.  He has an albuterol inhaler that he rarely uses.   swallowing better now but still very congested cough esp since early nov 2018  Not using flutter valve as rec  hfa has improved but not yet adequate Still some hb/ sore throat symptoms despite reporting ppi bid  Also still some noct cough sev hours p lie down despite using 6 inch blocks rec FOB 02/01/17> tenacious secretions> nl flora/ no tb, no fungus on culture    04/28/2017  f/u ov/Wert re: f/u COPD / cb abn ct again Chief Complaint  Patient presents with  . Acute Visit    Increased cough x 10 days- prod with white to  grey sputum. He started on Doxy per Dr Laurance Flatten- has one dose remaining. He states his breathing is unchanged since his last visit. He rarely uses his albuterol inhaler.   Had CT Chest done 04/26/17.    did better p first few days p fob until the end Dec nasty mucus, more sob > doxy then added zpak then better again then worse again last week if Feb 2019 restarted doxy and now on day #  9/10 on  ct chest 04/26/17 obst again on w/u for TAA Doe = MMRC1 = can walk nl pace, flat grade, can't hurry or go uphills or steps s sob   Sleeps ok but quit a bit of am congestion /mucus still grey   rec Please see patient coordinator before you leave today  to schedule sinus CT> neg  For nasty mucus >  zpak  Prednisone 10 mg take  4 each am x 2 days,   2 each am x 2 days,  1 each am x 2 days and stop  Work on inhaler technique:       12/25/2017 acute ov/Wert re: bronchiectasis flare, last levaquin was 12/20/17  / maint on  smyb 160 Chief Complaint  Patient presents with  . Acute Visit    started running fever 12/24/17. He also has had chest congestion and cough with dark brown sputum. He rarely uses his albuterol inhaler or neb.   Dyspnea:  MMRC3 = can't walk 100 yards even at a slow pace at a flat grade s stopping due to sob   Cough: worse one hour p hs with lots of yellow mucus esp in am / assoc overt HB Sleeping: 6 inch hob  SABA use: only once a day rec Sputum culture: nl flora/ neg afb smear  Mucinex is 1200 mg every 12  hours with flutter valve as much as possible  HRCT 11/3017 c/w bronchiectasis  VEST rec   02/05/2018  f/u ov/Wert re: copd 1, bronchiectasis maint on symb 160 2bid/flutter/vest  Chief Complaint  Patient presents with  . Follow-up   Dyspnea:  No change = mmrc3 Cough: better with approp use of flutter, no better with vest  Sleeping: 6 in blocks, some worse cough / congestion in am but not noct  SABA use: min rec Order was sent to DME company to discontinue your VEST        03/28/2018  f/u ov/Wert re: copd 1 with bronchiectasis/ maint symb 160 2bid Chief Complaint  Patient presents with  . Follow-up    Pt has productive cough-white, chest tightness, SOB with exertion.    Dyspnea:  MMRC2 = can't walk a nl pace on a flat grade s sob but does fine slow and flat ok at [employer] Cough variably purulent up to a half a cup in 24 h, fltter helps  Sleeping: 6 in bed blocks/ pillows x one  SABA use: varies  02: not using  Rec If mucus turns nasty/bloody > cipro 500 mg twice daily x 7 days (refillable) Please remember to go to the lab department   for your tests - we will call you with the results when they are available.    08/02/2018  f/u ov/Wert re: bronchiectasis / GOLD I copd  Chief Complaint  Patient presents with  . Follow-up    Cough with grey/green sputum, sometimes wakes in the night coughing. Last round of cipro was in Jan 2020. He is using his inhaler 2 x per wk on average.   Dyspnea:  Able to walk a quarter mile with dog some hills Cough: worse 2-3 am / uses flutter and back to bed also during the day  Sleeping: bed is flat/ right side down/ wedge SABA use: as above  02: none  rec Change cipro to 500 mg twice daily x 10 day cycles as needed for nasty mucus  02/17/20  Chief Complaint  Patient presents with  . Follow-up    Pt stated that he is having to use the flutter valve more and his secretions are more of a darker brown for the last 2-3 weeks.  Wanted to see if he could do another cycle of abx.  Discuss the covid booster vaccine.    [name] is a 86 year old male with bronchiectasis and COPD who presents for follow-up. He is compliant with his Symbicort and flutter valve 2-4x daily with thick sputum production. Has had increased sputum production. Sputum is dark brown but no green sputum. Reports fatigue. Denies fever, chills. Reports single episode of dizziness, nausea and retrosternal pain that took a few minutes to resolve.He  has not had episodes like this in the past. Shortness of breath  with exertion unchanged.  Review of Systems  Constitutional: Negative for chills, diaphoresis, fever, malaise/fatigue and weight loss.  HENT: Negative for congestion.   Respiratory: Positive for cough, sputum production, shortness of breath and wheezing. Negative for hemoptysis.   Cardiovascular: Positive for chest pain. Negative for palpitations and leg swelling.  Gastrointestinal: Positive for nausea. Negative for abdominal pain, heartburn and vomiting.  Neurological: Positive for dizziness. Negative for headaches.   Physical Exam: General: Elderly-appearing, no acute distress HENT: [place], AT Eyes: EOMI, no scleral icterus Respiratory: Clear to auscultation bilaterally.  No crackles, wheezing or rales Cardiovascular: RRR, -M/R/G, no JVD Extremities:-Edema,-tenderness Neuro: AAO x4, CNII-XII grossly intact Skin: Intact, no rashes or bruising Psych: Normal mood, normal affect  EKG reviewed. No ST changes. NSR.  Assessment/Plan Bronchiectasis Exacerbation - Start levaquin 500 mg x 5 days Dizziness - EKG without acute abnormality. Advised patient to contact Cardiology if symptoms recur. Will also CC patient's cardiologist  Counseled Covid Vaccination - Discussed benefits and risks of booster vaccination. Discussed proper hand washing and social distancing  I have spent a total time of 40-minutes on the day of the appointment reviewing prior documentation, coordinating care and discussing medical diagnosis and plan with the patient/family. Imaging, labs and tests included in this note have been reviewed and interpreted independently by me.  Rodman Pickle, M.D. [facility] 02/17/2020 8:12 PM   I have personally reviewed patient's past medical/family/social history/allergies/current medications.   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

## 2020-02-18 ENCOUNTER — Encounter: Payer: Self-pay | Admitting: Family Medicine

## 2020-02-18 NOTE — Telephone Encounter (Signed)
Defer to PCP's return

## 2020-02-19 DIAGNOSIS — L309 Dermatitis, unspecified: Secondary | ICD-10-CM | POA: Diagnosis not present

## 2020-02-19 DIAGNOSIS — Z85820 Personal history of malignant melanoma of skin: Secondary | ICD-10-CM | POA: Diagnosis not present

## 2020-02-19 DIAGNOSIS — L259 Unspecified contact dermatitis, unspecified cause: Secondary | ICD-10-CM | POA: Diagnosis not present

## 2020-02-19 DIAGNOSIS — D485 Neoplasm of uncertain behavior of skin: Secondary | ICD-10-CM | POA: Diagnosis not present

## 2020-02-19 DIAGNOSIS — L905 Scar conditions and fibrosis of skin: Secondary | ICD-10-CM | POA: Diagnosis not present

## 2020-02-19 DIAGNOSIS — Z85828 Personal history of other malignant neoplasm of skin: Secondary | ICD-10-CM | POA: Diagnosis not present

## 2020-02-27 ENCOUNTER — Encounter: Payer: Self-pay | Admitting: Family Medicine

## 2020-02-27 MED ORDER — TERAZOSIN HCL 1 MG PO CAPS: 1.0000 mg | ORAL_CAPSULE | Freq: Every day | ORAL | 3 refills | Status: DC

## 2020-03-03 NOTE — Telephone Encounter (Signed)
Pt called this morning and said that the terrazosin kept him up all night. He wants to know if there is something else he can use

## 2020-03-06 ENCOUNTER — Telehealth: Payer: Self-pay

## 2020-03-06 NOTE — Telephone Encounter (Signed)
Pt was on Flomax. It caused a rash. Dettinger sent in Terazosin. Now this is keeping the patient up all night.

## 2020-03-09 NOTE — Telephone Encounter (Signed)
Just have him start taking the terazosin in the morning instead and see if that makes a difference, try that for a week and if still having troubles with the sleeping then we can try a different brand.

## 2020-03-09 NOTE — Telephone Encounter (Signed)
Patient aware, he will start taking in the morning and if insomnia does not improve he will let us know.

## 2020-03-16 ENCOUNTER — Other Ambulatory Visit: Payer: Self-pay | Admitting: Endocrinology

## 2020-03-16 DIAGNOSIS — E221 Hyperprolactinemia: Secondary | ICD-10-CM

## 2020-03-16 NOTE — Telephone Encounter (Signed)
Last OV 12/2019 Last refill--12/2019  Please advise

## 2020-03-17 ENCOUNTER — Other Ambulatory Visit: Payer: Self-pay | Admitting: Family Medicine

## 2020-03-24 ENCOUNTER — Encounter: Payer: Self-pay | Admitting: Family Medicine

## 2020-03-26 ENCOUNTER — Telehealth: Payer: Self-pay | Admitting: Pulmonary Disease

## 2020-03-26 NOTE — Telephone Encounter (Signed)
Spoke with pt who is asking if it is ok to get Covid booster shot. Per 02/17/20 OV notes Dr. Loanne Drilling stated ok to get booster after antibiotic was finished. Pt states he finished antibiotic on 02/23/20. Informed pt of Dr. Cordelia Pen recomendation. Pt stated understanding. Nothing further needed at this time.

## 2020-03-30 ENCOUNTER — Encounter: Payer: Self-pay | Admitting: Family Medicine

## 2020-03-31 ENCOUNTER — Other Ambulatory Visit: Payer: Self-pay | Admitting: Family Medicine

## 2020-04-03 ENCOUNTER — Encounter: Payer: Self-pay | Admitting: Family Medicine

## 2020-04-22 ENCOUNTER — Ambulatory Visit (INDEPENDENT_AMBULATORY_CARE_PROVIDER_SITE_OTHER): Payer: Medicare Other | Admitting: Family Medicine

## 2020-04-22 ENCOUNTER — Other Ambulatory Visit: Payer: Self-pay

## 2020-04-22 ENCOUNTER — Encounter: Payer: Self-pay | Admitting: Family Medicine

## 2020-04-22 VITALS — BP 119/76 | HR 72 | Ht 66.0 in | Wt 163.0 lb

## 2020-04-22 DIAGNOSIS — J449 Chronic obstructive pulmonary disease, unspecified: Secondary | ICD-10-CM | POA: Diagnosis not present

## 2020-04-22 DIAGNOSIS — E785 Hyperlipidemia, unspecified: Secondary | ICD-10-CM

## 2020-04-22 DIAGNOSIS — E034 Atrophy of thyroid (acquired): Secondary | ICD-10-CM

## 2020-04-22 DIAGNOSIS — N400 Enlarged prostate without lower urinary tract symptoms: Secondary | ICD-10-CM | POA: Diagnosis not present

## 2020-04-22 MED ORDER — LEVOTHYROXINE SODIUM 75 MCG PO TABS
75.0000 ug | ORAL_TABLET | Freq: Every day | ORAL | 3 refills | Status: DC
Start: 2020-04-22 — End: 2021-06-25

## 2020-04-22 MED ORDER — TAMSULOSIN HCL 0.4 MG PO CAPS
0.4000 mg | ORAL_CAPSULE | Freq: Every day | ORAL | 3 refills | Status: DC
Start: 2020-04-22 — End: 2021-07-14

## 2020-04-22 NOTE — Progress Notes (Signed)
BP 119/76   Pulse 72   Ht $R'5\' 6"'lt$  (1.676 m)   Wt 163 lb (73.9 kg)   SpO2 97%   BMI 26.31 kg/m    Subjective:   Patient ID: [name], male    DOB: September 28, 1932, 87 y.o.   MRN: [id number]  HPI: [name] is a 87 y.o. male presenting on 04/22/2020 for Medical Management of Chronic Issues, Hyperlipidemia, and Hypothyroidism   HPI  Hypothyroidism recheck Patient is coming in for thyroid recheck today as well. They deny any issues with hair changes or heat or cold problems or diarrhea or constipation. They deny any chest pain or palpitations. They are currently on levothyroxine 75 micrograms   Hyperlipidemia Patient is coming in for recheck of his hyperlipidemia. The patient is currently taking no medication currently has been intolerant in the past. They deny any issues with myalgias or history of liver damage from it. They deny any focal numbness or weakness or chest pain.   COPD Patient is coming in for COPD recheck today.  He is currently on albuterol and Symbicort.  He has a mild chronic cough but denies any major coughing spells or wheezing spells.  He has 1nighttime symptoms per week and 2daytime symptoms per week currently.  Does get some coughing spells especially with cold weather.  Uses albuterol for them and it works  BPH Patient is coming in for recheck on BPH Symptoms: Urinary retention, seen urology but declined surgery Medication: Flomax Last PSA: 6 months ago, was 75  Patient has a lot of arthritic complaints including shoulders hips and knees does not initially want to do anything about it but just want to make a note that he does have arthritis in those joints.  Relevant past medical, surgical, family and social history reviewed and updated as indicated. Interim medical history since our last visit reviewed. Allergies and medications reviewed and updated.  Review of Systems  Constitutional: Negative for chills and fever.  Respiratory: Negative for  shortness of breath and wheezing.   Cardiovascular: Negative for chest pain and leg swelling.  Musculoskeletal: Positive for arthralgias and myalgias. Negative for back pain and gait problem.  Skin: Negative for rash.  Neurological: Negative for dizziness.  All other systems reviewed and are negative.   Per HPI unless specifically indicated above   Allergies as of 04/22/2020      Reactions   Betadine [povidone Iodine] Other (See Comments)   Reaction:  Blisters    Gadavist [gadobutrol]    hives   Lortab [hydrocodone-acetaminophen] Nausea And Vomiting   Penicillins Other (See Comments)   Reaction:  Lightheadedness  Has patient had a PCN reaction causing immediate rash, facial/tongue/throat swelling, SOB or lightheadedness with hypotension: Yes Has patient had a PCN reaction causing severe rash involving mucus membranes or skin necrosis: No Has patient had a PCN reaction that required hospitalization No Has patient had a PCN reaction occurring within the last 10 years: No If all of the above answers are "NO", then may proceed with Cephalosporin use.   Zetia [ezetimibe] Other (See Comments)   Reaction:  Muscle weakness    Zocor [simvastatin] Nausea Only, Other (See Comments)   Reaction:  Muscle weakness       Medication List       Accurate as of April 22, 2020 10:43 AM. If you have any questions, ask your nurse or doctor.        albuterol 108 (90 Base) MCG/ACT inhaler Commonly known as: VENTOLIN  HFA Inhale 2 puffs into the lungs every 6 (six) hours as needed for wheezing or shortness of breath.   bisacodyl 10 MG suppository Commonly known as: DULCOLAX Place 10 mg rectally as needed for moderate constipation.   bromocriptine 2.5 MG tablet Commonly known as: PARLODEL TAKE  (1)  TABLET TWICE A DAY.   calcium carbonate 1500 (600 Ca) MG Tabs tablet Commonly known as: OSCAL Take 600 mg of elemental calcium by mouth daily with breakfast.   cholecalciferol 1000 units  tablet Commonly known as: VITAMIN D Take 1,000 Units by mouth daily.   diclofenac Sodium 1 % Gel Commonly known as: VOLTAREN Apply 2 g topically 4 (four) times daily.   ferrous sulfate 325 (65 FE) MG tablet Take 1 tablet (325 mg total) by mouth daily with breakfast.   Flutter Devi Twice a day and prn as needed, may increase if feeling worse   GaviLAX 17 GM/SCOOP powder Generic drug: polyethylene glycol powder DISSOLVE 17 GM SCOOP IN 8 OZ. OF WATER AND DRINK ONCE DAILY AS NEEDED FOR CONSTIPATION   guaiFENesin 600 MG 12 hr tablet Commonly known as: MUCINEX Take 600 mg by mouth daily.   levalbuterol 0.63 MG/3ML nebulizer solution Commonly known as: Xopenex Take 3 mLs (0.63 mg total) by nebulization every 6 (six) hours as needed for wheezing or shortness of breath.   levofloxacin 500 MG tablet Commonly known as: LEVAQUIN Take 1 tablet (500 mg total) by mouth daily.   levothyroxine 75 MCG tablet Commonly known as: SYNTHROID Take 1 tablet (75 mcg total) by mouth daily.   MAGNESIUM CITRATE PO Take by mouth daily.   montelukast 10 MG tablet Commonly known as: SINGULAIR   naphazoline-pheniramine 0.025-0.3 % ophthalmic solution Commonly known as: NAPHCON-A Place 1 drop into both eyes 2 (two) times daily as needed for irritation or allergies.   nitroGLYCERIN 0.4 MG SL tablet Commonly known as: NITROSTAT Place 1 tablet (0.4 mg total) under the tongue every 5 (five) minutes as needed for chest pain.   pantoprazole 40 MG tablet Commonly known as: PROTONIX 1 - 2 tablets daily   sucralfate 1 g tablet Commonly known as: CARAFATE Take 1 tablet three times daily as needed   Symbicort 160-4.5 MCG/ACT inhaler Generic drug: budesonide-formoterol INHALE 2 PUFFS 2 TIMES A DAY   tamsulosin 0.4 MG Caps capsule Commonly known as: FLOMAX TAKE (1) CAPSULE DAILY   terazosin 1 MG capsule Commonly known as: HYTRIN Take 1 capsule (1 mg total) by mouth at bedtime.    tobramycin-dexamethasone ophthalmic solution Commonly known as: TobraDex Apply 1 drop in affected eye(s) every 2 hours for two days. Then every 4 hours for 5 days.   triamcinolone 0.1 % Commonly known as: KENALOG Apply 1 application topically 2 (two) times daily.   vitamin B-12 1000 MCG tablet Commonly known as: CYANOCOBALAMIN Take 1,000 mcg by mouth daily.   vitamin E 180 MG (400 UNITS) capsule Take 400 Units by mouth daily.        Objective:   BP 119/76   Pulse 72   Ht $R'5\' 6"'Os$  (1.676 m)   Wt 163 lb (73.9 kg)   SpO2 97%   BMI 26.31 kg/m   Wt Readings from Last 3 Encounters:  04/22/20 163 lb (73.9 kg)  02/17/20 159 lb (72.1 kg)  02/07/20 154 lb 15.7 oz (70.3 kg)    Physical Exam Vitals and nursing note reviewed.  Constitutional:      General: He is not in acute distress.  Appearance: He is well-developed and well-nourished. He is not diaphoretic.  Eyes:     General: No scleral icterus.    Extraocular Movements: EOM normal.     Conjunctiva/sclera: Conjunctivae normal.  Neck:     Thyroid: No thyromegaly.  Cardiovascular:     Rate and Rhythm: Normal rate and regular rhythm.     Pulses: Intact distal pulses.     Heart sounds: Normal heart sounds. No murmur heard.   Pulmonary:     Effort: Pulmonary effort is normal. No respiratory distress.     Breath sounds: Normal breath sounds. No wheezing.  Musculoskeletal:        General: No edema. Normal range of motion.     Cervical back: Neck supple.  Lymphadenopathy:     Cervical: No cervical adenopathy.  Skin:    General: Skin is warm and dry.     Findings: No rash.  Neurological:     Mental Status: He is alert and oriented to person, place, and time.     Coordination: Coordination normal.  Psychiatric:        Mood and Affect: Mood and affect normal.        Behavior: Behavior normal.       Assessment & Plan:   Problem List Items Addressed This Visit      Respiratory   COPD GOLD I   Relevant Orders    CBC with Differential/Platelet   CMP14+EGFR     Endocrine   Hypothyroidism - Primary   Relevant Medications   levothyroxine (SYNTHROID) 75 MCG tablet   Other Relevant Orders   TSH     Genitourinary   BPH (benign prostatic hyperplasia)   Relevant Medications   tamsulosin (FLOMAX) 0.4 MG CAPS capsule     Other   Hyperlipidemia LDL goal <70   Relevant Orders   Lipid panel      No change in medication, will check blood work, continue follow-up with cardiology and urology.  Continue to do home therapies and exercises for balance and strength, if worsens or does not improve we can do physical therapy referral but patient declined today Follow up plan: Return in about 3 months (around 07/20/2020), or if symptoms worsen or fail to improve, for Thyroid and cholesterol blood pressure.  Counseling provided for all of the vaccine components No orders of the defined types were placed in this encounter.   Caryl Pina, MD [facility] 04/22/2020, 10:43 AM

## 2020-04-23 LAB — LIPID PANEL
Chol/HDL Ratio: 3.3 ratio (ref 0.0–5.0)
Cholesterol, Total: 170 mg/dL (ref 100–199)
HDL: 51 mg/dL (ref >39)
LDL Chol Calc (NIH): 103 mg/dL — ABNORMAL HIGH (ref 0–99)
Triglycerides: 84 mg/dL (ref 0–149)
VLDL Cholesterol Cal: 16 mg/dL (ref 5–40)

## 2020-04-23 LAB — CMP14+EGFR
ALT: 15 IU/L (ref 0–44)
AST: 22 IU/L (ref 0–40)
Albumin/Globulin Ratio: 1.4 (ref 1.2–2.2)
Albumin: 3.9 g/dL (ref 3.6–4.6)
Alkaline Phosphatase: 79 IU/L (ref 44–121)
BUN/Creatinine Ratio: 22 (ref 10–24)
BUN: 23 mg/dL (ref 8–27)
Bilirubin Total: 0.3 mg/dL (ref 0.0–1.2)
CO2: 21 mmol/L (ref 20–29)
Calcium: 9.1 mg/dL (ref 8.6–10.2)
Chloride: 105 mmol/L (ref 96–106)
Creatinine, Ser: 1.05 mg/dL (ref 0.76–1.27)
GFR calc Af Amer: 74 mL/min/{1.73_m2} (ref >59)
GFR calc non Af Amer: 64 mL/min/{1.73_m2} (ref >59)
Globulin, Total: 2.7 g/dL (ref 1.5–4.5)
Glucose: 83 mg/dL (ref 65–99)
Potassium: 4.4 mmol/L (ref 3.5–5.2)
Sodium: 142 mmol/L (ref 134–144)
Total Protein: 6.6 g/dL (ref 6.0–8.5)

## 2020-04-23 LAB — TSH: TSH: 0.869 u[IU]/mL (ref 0.450–4.500)

## 2020-04-23 LAB — CBC WITH DIFFERENTIAL/PLATELET
Basophils Absolute: 0.2 10*3/uL (ref 0.0–0.2)
Basos: 2 %
EOS (ABSOLUTE): 0.4 10*3/uL (ref 0.0–0.4)
Eos: 4 %
Hematocrit: 36.9 % — ABNORMAL LOW (ref 37.5–51.0)
Hemoglobin: 12.2 g/dL — ABNORMAL LOW (ref 13.0–17.7)
Immature Grans (Abs): 0.5 10*3/uL — ABNORMAL HIGH (ref 0.0–0.1)
Immature Granulocytes: 5 %
Lymphocytes Absolute: 2.4 10*3/uL (ref 0.7–3.1)
Lymphs: 24 %
MCH: 31.9 pg (ref 26.6–33.0)
MCHC: 33.1 g/dL (ref 31.5–35.7)
MCV: 96 fL (ref 79–97)
Monocytes Absolute: 0.2 10*3/uL (ref 0.1–0.9)
Monocytes: 2 %
Neutrophils Absolute: 6.3 10*3/uL (ref 1.4–7.0)
Neutrophils: 63 %
Platelets: 246 10*3/uL (ref 150–450)
RBC: 3.83 x10E6/uL — ABNORMAL LOW (ref 4.14–5.80)
RDW: 14.1 % (ref 11.6–15.4)
WBC: 10 10*3/uL (ref 3.4–10.8)

## 2020-05-10 ENCOUNTER — Encounter: Payer: Self-pay | Admitting: Family Medicine

## 2020-05-11 ENCOUNTER — Ambulatory Visit (INDEPENDENT_AMBULATORY_CARE_PROVIDER_SITE_OTHER): Payer: Medicare Other | Admitting: Nurse Practitioner

## 2020-05-11 ENCOUNTER — Encounter: Payer: Self-pay | Admitting: Nurse Practitioner

## 2020-05-11 ENCOUNTER — Ambulatory Visit (INDEPENDENT_AMBULATORY_CARE_PROVIDER_SITE_OTHER): Payer: Medicare Other

## 2020-05-11 VITALS — BP 137/78 | HR 101 | Temp 97.4°F

## 2020-05-11 DIAGNOSIS — R0989 Other specified symptoms and signs involving the circulatory and respiratory systems: Secondary | ICD-10-CM | POA: Diagnosis not present

## 2020-05-11 MED ORDER — AZITHROMYCIN 250 MG PO TABS: ORAL_TABLET | ORAL | 0 refills | Status: DC

## 2020-05-11 NOTE — Progress Notes (Signed)
Acute Office Visit  Subjective:    Patient ID: [name], male    DOB: 09-Apr-1932, 87 y.o.   MRN: [id number]  Chief Complaint  Patient presents with  . Cough  . Shortness of Breath    X 2 weeks- Patient has COPD    Cough This is a recurrent problem. The current episode started in the past 7 days. The problem occurs constantly. The cough is productive of sputum. Associated symptoms include chest pain and shortness of breath. Pertinent negatives include no chills, fever or headaches. Nothing aggravates the symptoms. The treatment provided no relief. His past medical history is significant for COPD.   *  Past Medical History:  Diagnosis Date  . Abnormality of gait 06/07/2013  . Anemia   . Aneurysm ([place])    on assending aorta, currently watching it, Dr Cyndia Bent  . Anxiety   . Arthritis   . Asthma   . Atrial fibrillation ([place])   . Barrett's esophagus   . CAD (coronary artery disease) 03/2006   3.0 x 20 mm TAXUS Perseus DES to the LAD; 01/2007  L main 30%, oLAD 50%, pLAD stent ok, CFX 80%, OM 60%, pRCA 60%, mRCA 70%, oPDA 90%; med rx   . Cancer ([place])    skin cancer   . Cataract    bilateral removal of cateracts  . Complication of anesthesia     no issues,but pt prefers spinal due to Pulmonary problems  . COPD (chronic obstructive pulmonary disease) ([place])    oxygen  on standby in home.  . Depression   . Diverticulosis   . Emphysema   . Enlarged prostate with urinary retention   . Esophageal stenosis   . GERD (gastroesophageal reflux disease)   . Hiatal hernia   . Hypothyroidism   . Neuropathy   . Osteoporosis   . Pituitary macroadenoma ([place])   . Restless legs syndrome (RLS)   . UGIB (upper gastrointestinal bleed) 03/2013   EGD w/ large ulcer at GE junction    Past Surgical History:  Procedure Laterality Date  . ABDOMINAL HERNIA REPAIR   2008  . CARDIAC CATHETERIZATION  01/2007   L main 30%, oLAD 50%,  pLAD stent ok, CFX 80%, OM 60%, pRCA 60%, mRCA 70%, oPDA  90%; med rx  . cataract extraction both eyes    . COLONOSCOPY    . CORONARY STENT PLACEMENT  03/2006   3.0 x 20 mm TAXUS Perseus DES to the LAD  . CYSTOSCOPY N/A 06/10/2015   Procedure: CYSTOSCOPY FULGRATION OF BLEEDING,  electovapor resection;  Surgeon: Irine Seal, MD;  Location: [facility];  Service: Urology;  Laterality: N/A;  . CYSTOSCOPY WITH INSERTION OF UROLIFT N/A 06/01/2015   Procedure: CYSTOSCOPY WITH INSERTION OF UROLIFT x4;  Surgeon: Irine Seal, MD;  Location: [facility];  Service: Urology;  Laterality: N/A;  . ESOPHAGOGASTRODUODENOSCOPY N/A 04/20/2013   Procedure: ESOPHAGOGASTRODUODENOSCOPY (EGD);  Surgeon: Inda Castle, MD;  Location: [facility];  Service: Endoscopy;  Laterality: N/A;  . ESOPHAGOGASTRODUODENOSCOPY N/A 03/25/2016   Procedure: ESOPHAGOGASTRODUODENOSCOPY (EGD);  Surgeon: Irene Shipper, MD;  Location: [facility];  Service: Endoscopy;  Laterality: N/A;  . ESOPHAGOGASTRODUODENOSCOPY (EGD) WITH PROPOFOL N/A 10/13/2015   Procedure: ESOPHAGOGASTRODUODENOSCOPY (EGD) WITH PROPOFOL;  Surgeon: Jerene Bears, MD;  [facility];  Service: Gastroenterology;  Laterality: N/A;  . FOOT SURGERY  1994 left, 2002 right foot   bilateral foot reconstruciton  . FOOT SURGERY     reconstruction of both feet- no retained  hardware.  Marland Kitchen HIATAL HERNIA REPAIR  01-04-2008  . MELANOMA EXCISION  2019  . POLYPECTOMY    . PROSTATE SURGERY     x 2  . SAVORY DILATION N/A 03/25/2016   Procedure: SAVORY DILATION;  Surgeon: Irene Shipper, MD;  [facility];  Service: Endoscopy;  Laterality: N/A;  . TOTAL HIP ARTHROPLASTY  07/21/2011   Procedure: TOTAL HIP ARTHROPLASTY ANTERIOR APPROACH;  Surgeon: Mauri Pole, MD;  [facility]: [facility];  Service: Orthopedics;  Laterality: Left;  . TOTAL HIP ARTHROPLASTY Right 09/07/2012   Procedure: RIGHT TOTAL HIP ARTHROPLASTY ANTERIOR APPROACH;  Surgeon: Mcarthur Rossetti, MD;  Location: [facility];  Service: Orthopedics;  Laterality: Right;  . TRANSURETHRAL  RESECTION OF BLADDER NECK N/A 11/04/2015   Procedure: RESECTION OF BLADDER NECK;  Surgeon: Cleon Gustin, MD;  Location: [facility];  Service: Urology;  Laterality: N/A;  . TRANSURETHRAL RESECTION OF PROSTATE N/A 11/04/2015   Procedure: TRANSURETHRAL RESECTION OF THE PROSTATE (TURP); REMOVAL OF UROLIFT IMPLANTS X THREE;  Surgeon: Cleon Gustin, MD;  Location: [facility];  Service: Urology;  Laterality: N/A;  . UPPER GASTROINTESTINAL ENDOSCOPY  12/2013   Dr Hilarie Fredrickson, gastritis  . VIDEO BRONCHOSCOPY Bilateral 02/01/2017   Procedure: VIDEO BRONCHOSCOPY WITHOUT FLUORO;  Surgeon: Tanda Rockers, MD;  [facility];  Service: Cardiopulmonary;  Laterality: Bilateral;    Family History  Problem Relation Age of Onset  . Heart disease Sister   . Heart failure Father   . Other Mother        phebitis related to Keiondre's birth, he was 61 weeks old when she died  . Colon cancer Neg Hx   . Esophageal cancer Neg Hx   . Rectal cancer Neg Hx   . Stomach cancer Neg Hx   . Colon polyps Neg Hx     Social History   Socioeconomic History  . Marital status: Widowed    Spouse name: Not on file  . Number of children: 2  . Years of education: 33  . Highest education level: Associate degree: occupational, Hotel manager, or vocational program  Occupational History  . Occupation: retired  Tobacco Use  . Smoking status: Never Smoker  . Smokeless tobacco: Never Used  Vaping Use  . Vaping Use: Never used  Substance and Sexual Activity  . Alcohol use: No  . Drug use: No  . Sexual activity: Not Currently    Birth control/protection: None  Other Topics Concern  . Not on file  Social History Narrative   Patient drinks caffeine a few times a week.   Patient is right handed.   Social Determinants of Health   Financial Resource Strain: Low Risk   . Difficulty of Paying Living Expenses: Not hard at all  Food Insecurity: No Food Insecurity  . Worried About Charity fundraiser in the Last Year: Never true   . Ran Out of Food in the Last Year: Never true  Transportation Needs: No Transportation Needs  . Lack of Transportation (Medical): No  . Lack of Transportation (Non-Medical): No  Physical Activity: Insufficiently Active  . Days of Exercise per Week: 5 days  . Minutes of Exercise per Session: 20 min  Stress: No Stress Concern Present  . Feeling of Stress : Not at all  Social Connections: Moderately Integrated  . Frequency of Communication with Friends and Family: More than three times a week  . Frequency of Social Gatherings with Friends and Family: More than three times a week  .  Attends Religious Services: More than 4 times per year  . Active Member of Clubs or Organizations: Yes  . Attends Archivist Meetings: More than 4 times per year  . Marital Status: Widowed  Intimate Partner Violence: Not At Risk  . Fear of Current or Ex-Partner: No  . Emotionally Abused: No  . Physically Abused: No  . Sexually Abused: No    Outpatient Medications Prior to Visit  Medication Sig Dispense Refill  . albuterol (PROVENTIL HFA;VENTOLIN HFA) 108 (90 Base) MCG/ACT inhaler Inhale 2 puffs into the lungs every 6 (six) hours as needed for wheezing or shortness of breath. 18 g 2  . bisacodyl (DULCOLAX) 10 MG suppository Place 10 mg rectally as needed for moderate constipation.    . bromocriptine (PARLODEL) 2.5 MG tablet TAKE  (1)  TABLET TWICE A DAY. 60 tablet 0  . calcium carbonate (OSCAL) 1500 (600 Ca) MG TABS tablet Take 600 mg of elemental calcium by mouth daily with breakfast.     . cholecalciferol (VITAMIN D) 1000 UNITS tablet Take 1,000 Units by mouth daily.     . diclofenac Sodium (VOLTAREN) 1 % GEL Apply 2 g topically 4 (four) times daily. 50 g 1  . ferrous sulfate 325 (65 FE) MG tablet Take 1 tablet (325 mg total) by mouth daily with breakfast. 90 tablet 3  . GAVILAX 17 GM/SCOOP powder DISSOLVE 17 GM SCOOP IN 8 OZ. OF WATER AND DRINK ONCE DAILY AS NEEDED FOR CONSTIPATION 510 g 0  .  guaiFENesin (MUCINEX) 600 MG 12 hr tablet Take 600 mg by mouth daily.    Marland Kitchen levalbuterol (XOPENEX) 0.63 MG/3ML nebulizer solution Take 3 mLs (0.63 mg total) by nebulization every 6 (six) hours as needed for wheezing or shortness of breath. 72 mL 1  . levofloxacin (LEVAQUIN) 500 MG tablet Take 1 tablet (500 mg total) by mouth daily. 5 tablet 0  . levothyroxine (SYNTHROID) 75 MCG tablet Take 1 tablet (75 mcg total) by mouth daily. 90 tablet 3  . MAGNESIUM CITRATE PO Take by mouth daily.    . montelukast (SINGULAIR) 10 MG tablet     . naphazoline-pheniramine (NAPHCON-A) 0.025-0.3 % ophthalmic solution Place 1 drop into both eyes 2 (two) times daily as needed for irritation or allergies.     . nitroGLYCERIN (NITROSTAT) 0.4 MG SL tablet Place 1 tablet (0.4 mg total) under the tongue every 5 (five) minutes as needed for chest pain. 25 tablet 2  . pantoprazole (PROTONIX) 40 MG tablet 1 - 2 tablets daily 180 tablet 3  . Respiratory Therapy Supplies (FLUTTER) DEVI Twice a day and prn as needed, may increase if feeling worse 1 each 0  . sucralfate (CARAFATE) 1 g tablet Take 1 tablet three times daily as needed 270 tablet 3  . SYMBICORT 160-4.5 MCG/ACT inhaler INHALE 2 PUFFS 2 TIMES A DAY 10.2 g 5  . tamsulosin (FLOMAX) 0.4 MG CAPS capsule Take 1 capsule (0.4 mg total) by mouth daily after supper. 90 capsule 3  . terazosin (HYTRIN) 1 MG capsule Take 1 capsule (1 mg total) by mouth at bedtime. 90 capsule 3  . tobramycin-dexamethasone (TOBRADEX) ophthalmic solution Apply 1 drop in affected eye(s) every 2 hours for two days. Then every 4 hours for 5 days. 5 mL 0  . triamcinolone cream (KENALOG) 0.1 % Apply 1 application topically 2 (two) times daily. 30 g 0  . vitamin B-12 (CYANOCOBALAMIN) 1000 MCG tablet Take 1,000 mcg by mouth daily.    . vitamin E  180 MG (400 UNITS) capsule Take 400 Units by mouth daily.     No facility-administered medications prior to visit.    Allergies  Allergen Reactions  . Betadine  [Povidone Iodine] Other (See Comments)    Reaction:  Blisters   . Gadavist [Gadobutrol]     hives  . Lortab [Hydrocodone-Acetaminophen] Nausea And Vomiting  . Penicillins Other (See Comments)    Reaction:  Lightheadedness  Has patient had a PCN reaction causing immediate rash, facial/tongue/throat swelling, SOB or lightheadedness with hypotension: Yes Has patient had a PCN reaction causing severe rash involving mucus membranes or skin necrosis: No Has patient had a PCN reaction that required hospitalization No Has patient had a PCN reaction occurring within the last 10 years: No If all of the above answers are "NO", then may proceed with Cephalosporin use.  Marland Kitchen Zetia [Ezetimibe] Other (See Comments)    Reaction:  Muscle weakness   . Zocor [Simvastatin] Nausea Only and Other (See Comments)    Reaction:  Muscle weakness     Review of Systems  Constitutional: Negative for chills and fever.  HENT: Positive for congestion.   Respiratory: Positive for cough and shortness of breath.   Cardiovascular: Positive for chest pain.  Gastrointestinal: Negative.   Genitourinary: Negative.   Neurological: Negative for headaches.  All other systems reviewed and are negative.      Objective:    Physical Exam Vitals reviewed.  Constitutional:      Appearance: He is well-developed.  HENT:     Head: Normocephalic.     Nose: Nose normal.     Mouth/Throat:     Mouth: Mucous membranes are moist.  Eyes:     Conjunctiva/sclera: Conjunctivae normal.  Cardiovascular:     Rate and Rhythm: Normal rate.  Pulmonary:     Breath sounds: No wheezing.     Comments: Cough Chest:     Chest wall: Tenderness present.  Abdominal:     General: Bowel sounds are normal.  Musculoskeletal:        General: Normal range of motion.  Skin:    General: Skin is warm.  Neurological:     Mental Status: He is alert and oriented to person, place, and time.  Psychiatric:        Behavior: Behavior normal.     BP  137/78   Pulse (!) 101   Temp (!) 97.4 F (36.3 C) (Temporal)   SpO2 96%  Wt Readings from Last 3 Encounters:  04/22/20 163 lb (73.9 kg)  02/17/20 159 lb (72.1 kg)  02/07/20 154 lb 15.7 oz (70.3 kg)    There are no preventive care reminders to display for this patient.  There are no preventive care reminders to display for this patient.   Lab Results  Component Value Date   TSH 0.869 04/22/2020   Lab Results  Component Value Date   WBC 10.0 04/22/2020   HGB 12.2 (L) 04/22/2020   HCT 36.9 (L) 04/22/2020   MCV 96 04/22/2020   PLT 246 04/22/2020   Lab Results  Component Value Date   NA 142 04/22/2020   K 4.4 04/22/2020   CO2 21 04/22/2020   GLUCOSE 83 04/22/2020   BUN 23 04/22/2020   CREATININE 1.05 04/22/2020   BILITOT 0.3 04/22/2020   ALKPHOS 79 04/22/2020   AST 22 04/22/2020   ALT 15 04/22/2020   PROT 6.6 04/22/2020   ALBUMIN 3.9 04/22/2020   CALCIUM 9.1 04/22/2020   ANIONGAP 6 10/23/2015  GFR 73.80 05/07/2018   Lab Results  Component Value Date   CHOL 170 04/22/2020   Lab Results  Component Value Date   HDL 51 04/22/2020   Lab Results  Component Value Date   LDLCALC 103 (H) 04/22/2020   Lab Results  Component Value Date   TRIG 84 04/22/2020   Lab Results  Component Value Date   CHOLHDL 3.3 04/22/2020   No results found for: HGBA1C     Assessment & Plan:   Problem List Items Addressed This Visit      Respiratory   Chest congestion - Primary    Worsening symptoms of cough and sputum production with chest tightness in the last few days.  Patient has a history of COPD current medication provided no therapeutic relief.  Patient denies fever, body aches and chills. Chest x-ray completed to rule out pneumonia.  Results pending.  Patient started on azithromycin. Education provided with printed handouts given. Follow-up with worsening unresolved symptoms.      Relevant Medications   azithromycin (ZITHROMAX) 250 MG tablet   Other Relevant  Orders   DG Chest 2 View       Meds ordered this encounter  Medications  . azithromycin (ZITHROMAX) 250 MG tablet    Sig: 2 tablets day 1, 1 tablet day 2-5.    Dispense:  1 each    Refill:  0    Order Specific Question:   Supervising Provider    Answer:   Janora Norlander [[id number]]     Ivy Lynn, NP

## 2020-05-11 NOTE — Patient Instructions (Signed)

## 2020-05-12 ENCOUNTER — Other Ambulatory Visit: Payer: Self-pay | Admitting: *Deleted

## 2020-05-12 ENCOUNTER — Ambulatory Visit: Payer: Medicare Other | Admitting: Family Medicine

## 2020-05-12 DIAGNOSIS — R059 Cough, unspecified: Secondary | ICD-10-CM

## 2020-05-12 DIAGNOSIS — R9389 Abnormal findings on diagnostic imaging of other specified body structures: Secondary | ICD-10-CM

## 2020-05-12 NOTE — Assessment & Plan Note (Signed)
Worsening symptoms of cough and sputum production with chest tightness in the last few days.  Patient has a history of COPD current medication provided no therapeutic relief.  Patient denies fever, body aches and chills. Chest x-ray completed to rule out pneumonia.  Results pending.  Patient started on azithromycin. Education provided with printed handouts given. Follow-up with worsening unresolved symptoms.

## 2020-05-13 ENCOUNTER — Telehealth: Payer: Self-pay | Admitting: Pulmonary Disease

## 2020-05-13 ENCOUNTER — Telehealth: Payer: Self-pay | Admitting: Family Medicine

## 2020-05-13 NOTE — Telephone Encounter (Signed)
Called patient , let him know that will send message to Dr. Loanne Drilling for [place] . After CT is done and back please set up f/up with Dr. Loanne Drilling

## 2020-05-13 NOTE — Telephone Encounter (Signed)
Yes, please schedule patient in available slot in April to review CT Chest.

## 2020-05-13 NOTE — Telephone Encounter (Signed)
Attempted to call pt to get him scheduled for an appt with Dr. Loanne Drilling after he has his CT so she could go over the results of it with him but unable to reach.  Left message for him to return call. Pt has the CT 06/09/20 so f/u appt needs to be after that with Dr. Loanne Drilling. Looks like first avail is 06/17/20. When pt calls back, please schedule pt a f/u appt with Dr. Loanne Drilling to go over CT results.

## 2020-05-14 NOTE — Telephone Encounter (Signed)
Called and spoke with pt stating to him that Dr. Loanne Drilling said it was okay for [name] to schedule him an appt to discuss CT results. Pt verbalized understanding and appt has been scheduled for pt. Nothing further needed.

## 2020-05-18 DIAGNOSIS — L2084 Intrinsic (allergic) eczema: Secondary | ICD-10-CM | POA: Diagnosis not present

## 2020-05-18 DIAGNOSIS — C44529 Squamous cell carcinoma of skin of other part of trunk: Secondary | ICD-10-CM | POA: Diagnosis not present

## 2020-05-18 DIAGNOSIS — Z85828 Personal history of other malignant neoplasm of skin: Secondary | ICD-10-CM | POA: Diagnosis not present

## 2020-05-18 DIAGNOSIS — L905 Scar conditions and fibrosis of skin: Secondary | ICD-10-CM | POA: Diagnosis not present

## 2020-05-18 DIAGNOSIS — L820 Inflamed seborrheic keratosis: Secondary | ICD-10-CM | POA: Diagnosis not present

## 2020-05-18 DIAGNOSIS — Z85820 Personal history of malignant melanoma of skin: Secondary | ICD-10-CM | POA: Diagnosis not present

## 2020-05-18 DIAGNOSIS — D485 Neoplasm of uncertain behavior of skin: Secondary | ICD-10-CM | POA: Diagnosis not present

## 2020-05-18 DIAGNOSIS — L570 Actinic keratosis: Secondary | ICD-10-CM | POA: Diagnosis not present

## 2020-05-20 ENCOUNTER — Encounter: Payer: Self-pay | Admitting: Cardiology

## 2020-05-20 ENCOUNTER — Other Ambulatory Visit: Payer: Self-pay

## 2020-05-20 ENCOUNTER — Ambulatory Visit: Payer: Medicare Other | Admitting: Cardiology

## 2020-05-20 VITALS — BP 102/64 | HR 72 | Ht 62.0 in | Wt 168.0 lb

## 2020-05-20 DIAGNOSIS — I77819 Aortic ectasia, unspecified site: Secondary | ICD-10-CM | POA: Insufficient documentation

## 2020-05-20 DIAGNOSIS — I2510 Atherosclerotic heart disease of native coronary artery without angina pectoris: Secondary | ICD-10-CM | POA: Diagnosis not present

## 2020-05-20 MED ORDER — ASPIRIN EC 81 MG PO TBEC: 81.0000 mg | DELAYED_RELEASE_TABLET | Freq: Every day | ORAL | 3 refills | Status: DC

## 2020-05-20 NOTE — Patient Instructions (Signed)
Medication Instructions:  Please start ASA 81 mg a day. Continue all other medications as listed.  *If you need a refill on your cardiac medications before your next appointment, please call your pharmacy*  Follow-Up: At [facility], you and your health needs are our priority.  As part of our continuing mission to provide you with exceptional heart care, we have created designated Provider Care Teams.  These Care Teams include your primary Cardiologist (physician) and Advanced Practice Providers (APPs -  Physician Assistants and Nurse Practitioners) who all work together to provide you with the care you need, when you need it.  We recommend signing up for the patient portal called "MyChart".  Sign up information is provided on this After Visit Summary.  MyChart is used to connect with patients for Virtual Visits (Telemedicine).  Patients are able to view lab/test results, encounter notes, upcoming appointments, etc.  Non-urgent messages can be sent to your provider as well.   To learn more about what you can do with MyChart, go to [email or web address].    Your next appointment:   12 month(s)  The format for your next appointment:   In Person  Provider:   Minus Breeding, MD   Thank you for choosing [facility]!!

## 2020-05-20 NOTE — Progress Notes (Signed)
Cardiology Office Note   Date:  05/20/2020   ID:  DEWARREN [name], DOB [birth year], MRN [id number]  PCP:  Dettinger, [name], MD  Cardiologist:   Minus Breeding, MD   Chief Complaint  Patient presents with  . Cough      History of Present Illness: [name] is a 87 y.o. male who presents for who presents for followup of his known coronary disease. He had a negative stress perfusion study in 2017.   He wore a Holter.  There were PVCs, non sustained V tach and atrial tach and PVCs/PACs.   Echo at that time was essentially unremarkable.  He had episodes of light headedness.  He had a transient low BP and had a borderline saturation briefly.  I sent him for an EKG which was unremarkable.  He was not anemic.  He returns for follow up.    Since I last saw him he was having a cough and had a CXR.  He has a density on the right lower lobe possible neoplasm with a CT scheduled.  He has had no new cardiovascular complaints other than 3 months ago he had some chest pain.  This was driving home after a stressful situation.  He is no longer had any of this discomfort.  He says he is able to do activities such as walking on a treadmill and using a fitness board.  This does not bring on any chest pressure, neck or arm discomfort.  He has no new shortness of breath, PND or orthopnea.  Past Medical History:  Diagnosis Date  . Abnormality of gait 06/07/2013  . Anemia   . Aneurysm ([place])    on assending aorta, currently watching it, Dr Cyndia Bent  . Anxiety   . Arthritis   . Asthma   . Atrial fibrillation ([place])   . Barrett's esophagus   . CAD (coronary artery disease) 03/2006   3.0 x 20 mm TAXUS Perseus DES to the LAD; 01/2007  L main 30%, oLAD 50%, pLAD stent ok, CFX 80%, OM 60%, pRCA 60%, mRCA 70%, oPDA 90%; med rx   . Cancer ([place])    skin cancer   . Cataract    bilateral removal of cateracts  . Complication of anesthesia     no issues,but pt prefers spinal due to Pulmonary problems  .  COPD (chronic obstructive pulmonary disease) ([place])    oxygen  on standby in home.  . Depression   . Diverticulosis   . Emphysema   . Enlarged prostate with urinary retention   . Esophageal stenosis   . GERD (gastroesophageal reflux disease)   . Hiatal hernia   . Hypothyroidism   . Neuropathy   . Osteoporosis   . Pituitary macroadenoma ([place])   . Restless legs syndrome (RLS)   . UGIB (upper gastrointestinal bleed) 03/2013   EGD w/ large ulcer at GE junction    Past Surgical History:  Procedure Laterality Date  . ABDOMINAL HERNIA REPAIR   2008  . CARDIAC CATHETERIZATION  01/2007   L main 30%, oLAD 50%,  pLAD stent ok, CFX 80%, OM 60%, pRCA 60%, mRCA 70%, oPDA 90%; med rx  . cataract extraction both eyes    . COLONOSCOPY    . CORONARY STENT PLACEMENT  03/2006   3.0 x 20 mm TAXUS Perseus DES to the LAD  . CYSTOSCOPY N/A 06/10/2015   Procedure: CYSTOSCOPY FULGRATION OF BLEEDING,  electovapor resection;  Surgeon: Irine Seal, MD;  Location: [facility];  Service: Urology;  Laterality: N/A;  . CYSTOSCOPY WITH INSERTION OF UROLIFT N/A 06/01/2015   Procedure: CYSTOSCOPY WITH INSERTION OF UROLIFT x4;  Surgeon: Irine Seal, MD;  Location: [facility];  Service: Urology;  Laterality: N/A;  . ESOPHAGOGASTRODUODENOSCOPY N/A 04/20/2013   Procedure: ESOPHAGOGASTRODUODENOSCOPY (EGD);  Surgeon: Inda Castle, MD;  Location: [facility];  Service: Endoscopy;  Laterality: N/A;  . ESOPHAGOGASTRODUODENOSCOPY N/A 03/25/2016   Procedure: ESOPHAGOGASTRODUODENOSCOPY (EGD);  Surgeon: Irene Shipper, MD;  Location: [facility];  Service: Endoscopy;  Laterality: N/A;  . ESOPHAGOGASTRODUODENOSCOPY (EGD) WITH PROPOFOL N/A 10/13/2015   Procedure: ESOPHAGOGASTRODUODENOSCOPY (EGD) WITH PROPOFOL;  Surgeon: Jerene Bears, MD;  Location: [facility];  Service: Gastroenterology;  Laterality: N/A;  . FOOT SURGERY  1994 left, 2002 right foot   bilateral foot reconstruciton  . FOOT SURGERY     reconstruction of both feet- no retained  hardware.  Marland Kitchen HIATAL HERNIA REPAIR  01-04-2008  . MELANOMA EXCISION  2019  . POLYPECTOMY    . PROSTATE SURGERY     x 2  . SAVORY DILATION N/A 03/25/2016   Procedure: SAVORY DILATION;  Surgeon: Irene Shipper, MD;  Location: [facility];  Service: Endoscopy;  Laterality: N/A;  . TOTAL HIP ARTHROPLASTY  07/21/2011   Procedure: TOTAL HIP ARTHROPLASTY ANTERIOR APPROACH;  Surgeon: Mauri Pole, MD;  Location: [facility];  Service: Orthopedics;  Laterality: Left;  . TOTAL HIP ARTHROPLASTY Right 09/07/2012   Procedure: RIGHT TOTAL HIP ARTHROPLASTY ANTERIOR APPROACH;  Surgeon: Mcarthur Rossetti, MD;  Location: [facility];  Service: Orthopedics;  Laterality: Right;  . TRANSURETHRAL RESECTION OF BLADDER NECK N/A 11/04/2015   Procedure: RESECTION OF BLADDER NECK;  Surgeon: Cleon Gustin, MD;  Location: [facility];  Service: Urology;  Laterality: N/A;  . TRANSURETHRAL RESECTION OF PROSTATE N/A 11/04/2015   Procedure: TRANSURETHRAL RESECTION OF THE PROSTATE (TURP); REMOVAL OF UROLIFT IMPLANTS X THREE;  Surgeon: Cleon Gustin, MD;  Location: [facility];  Service: Urology;  Laterality: N/A;  . UPPER GASTROINTESTINAL ENDOSCOPY  12/2013   Dr Hilarie Fredrickson, gastritis  . VIDEO BRONCHOSCOPY Bilateral 02/01/2017   Procedure: VIDEO BRONCHOSCOPY WITHOUT FLUORO;  Surgeon: Tanda Rockers, MD;  Location: [facility];  Service: Cardiopulmonary;  Laterality: Bilateral;     Current Outpatient Medications  Medication Sig Dispense Refill  . albuterol (PROVENTIL HFA;VENTOLIN HFA) 108 (90 Base) MCG/ACT inhaler Inhale 2 puffs into the lungs every 6 (six) hours as needed for wheezing or shortness of breath. 18 g 2  . aspirin EC 81 MG tablet Take 1 tablet (81 mg total) by mouth daily. Swallow whole. 90 tablet 3  . bisacodyl (DULCOLAX) 10 MG suppository Place 10 mg rectally as needed for moderate constipation.    . bromocriptine (PARLODEL) 2.5 MG tablet TAKE  (1)  TABLET TWICE A DAY. 60 tablet 0  . calcium carbonate (OSCAL) 1500 (600 Ca)  MG TABS tablet Take 600 mg of elemental calcium by mouth daily with breakfast.     . cholecalciferol (VITAMIN D) 1000 UNITS tablet Take 1,000 Units by mouth daily.     . diclofenac Sodium (VOLTAREN) 1 % GEL Apply 2 g topically 4 (four) times daily. 50 g 1  . ferrous sulfate 325 (65 FE) MG tablet Take 1 tablet (325 mg total) by mouth daily with breakfast. 90 tablet 3  . GAVILAX 17 GM/SCOOP powder DISSOLVE 17 GM SCOOP IN 8 OZ. OF WATER AND DRINK ONCE DAILY AS NEEDED FOR CONSTIPATION 510 g 0  . guaiFENesin ([place])  600 MG 12 hr tablet Take 600 mg by mouth daily.    Marland Kitchen levalbuterol (XOPENEX) 0.63 MG/3ML nebulizer solution Take 3 mLs (0.63 mg total) by nebulization every 6 (six) hours as needed for wheezing or shortness of breath. 72 mL 1  . levothyroxine (SYNTHROID) 75 MCG tablet Take 1 tablet (75 mcg total) by mouth daily. 90 tablet 3  . MAGNESIUM CITRATE PO Take by mouth daily.    . naphazoline-pheniramine (NAPHCON-A) 0.025-0.3 % ophthalmic solution Place 1 drop into both eyes 2 (two) times daily as needed for irritation or allergies.     . nitroGLYCERIN (NITROSTAT) 0.4 MG SL tablet Place 1 tablet (0.4 mg total) under the tongue every 5 (five) minutes as needed for chest pain. 25 tablet 2  . pantoprazole (PROTONIX) 40 MG tablet 1 - 2 tablets daily 180 tablet 3  . sucralfate (CARAFATE) 1 g tablet Take 1 tablet three times daily as needed 270 tablet 3  . SYMBICORT 160-4.5 MCG/ACT inhaler INHALE 2 PUFFS 2 TIMES A DAY 10.2 g 5  . tamsulosin (FLOMAX) 0.4 MG CAPS capsule Take 1 capsule (0.4 mg total) by mouth daily after supper. 90 capsule 3  . terazosin (HYTRIN) 1 MG capsule Take 1 capsule (1 mg total) by mouth at bedtime. 90 capsule 3  . triamcinolone cream (KENALOG) 0.1 % Apply 1 application topically 2 (two) times daily. 30 g 0  . vitamin B-12 (CYANOCOBALAMIN) 1000 MCG tablet Take 1,000 mcg by mouth daily.    . vitamin E 180 MG (400 UNITS) capsule Take 400 Units by mouth daily.    Marland Kitchen Respiratory  Therapy Supplies (FLUTTER) DEVI Twice a day and prn as needed, may increase if feeling worse 1 each 0  . tobramycin-dexamethasone (TOBRADEX) ophthalmic solution Apply 1 drop in affected eye(s) every 2 hours for two days. Then every 4 hours for 5 days. (Patient not taking: Reported on 05/20/2020) 5 mL 0   No current facility-administered medications for this visit.    Allergies:   Betadine [povidone iodine], Gadavist [gadobutrol], Lortab [hydrocodone-acetaminophen], Penicillins, Zetia [ezetimibe], and Zocor [simvastatin]    ROS:  Please see the history of present illness.   Otherwise, review of systems are positive for none.   All other systems are reviewed and negative.    PHYSICAL EXAM: VS:  BP 102/64   Pulse 72   Ht 5\' 2"  (1.575 m)   Wt 168 lb (76.2 kg)   BMI 30.73 kg/m  , BMI Body mass index is 30.73 kg/m. GENERAL:  Well appearing NECK:  No jugular venous distention, waveform within normal limits, carotid upstroke brisk and symmetric, no bruits, no thyromegaly LUNGS:  Clear to auscultation bilaterally CHEST:  Unremarkable HEART:  PMI not displaced or sustained,S1 and S2 within normal limits, no S3, no S4, no clicks, no rubs, no murmurs ABD:  Flat, positive bowel sounds normal in frequency in pitch, no bruits, no rebound, no guarding, no midline pulsatile mass, no hepatomegaly, no splenomegaly EXT:  2 plus pulses throughout, no edema, no cyanosis no clubbing   EKG:  EKG is  ordered today. The ekg ordered today demonstrates sinus rhythm, rate 72, axis within normal limits, intervals within normal limits, no acute ST-T wave changes.   Recent Labs: 04/22/2020: ALT 15; BUN 23; Creatinine, Ser 1.05; Hemoglobin 12.2; Platelets 246; Potassium 4.4; Sodium 142; TSH 0.869    Lipid Panel    Component Value Date/Time   CHOL 170 04/22/2020 1125   TRIG 84 04/22/2020 1125   TRIG 71  04/01/2015 0934   HDL 51 04/22/2020 1125   HDL 56 04/01/2015 0934   CHOLHDL 3.3 04/22/2020 1125   CHOLHDL  2.9 06/12/2015 0529   VLDL 9 06/12/2015 0529   LDLCALC 103 (H) 04/22/2020 1125   LDLCALC 121 (H) 08/29/2013 0909      Wt Readings from Last 3 Encounters:  05/20/20 168 lb (76.2 kg)  04/22/20 163 lb (73.9 kg)  02/17/20 159 lb (72.1 kg)      Other studies Reviewed: Additional studies/ records that were reviewed today include:  CXR Review of the above records demonstrates:  Please see elsewhere in the note.     ASSESSMENT AND PLAN:  ARRHYTHMIA - He is not having any palpitations.  No change in therapy.   CORONARY ARTERY DISEASE - He was not taking aspirin.  I asked him to start taking 81 mg.   ATRIAL FIB- He has had no symptomatic paroxysms of this.  He had this in the distant past but he is also had GI bleeding so he is not on systemic anticoagulation.   ASCENDING AORTIC ANEURYSM- This was 4.4 cm and is scheduled for follow up in April.  This is to follow-up a lung mass but we will be able to size the aorta as well.  DYSLIPIDEMIA: He has been somewhat intolerant of statins and so I am not going to push further.  His LDL was 103 and HDL 51.  BRUIT: He had mild plaque on Doppler last year.  No further imaging.   Current medicines are reviewed at length with the patient today.  The patient does not have concerns regarding medicines.  The following changes have been made:  As above  Labs/ tests ordered today include:   No orders of the defined types were placed in this encounter.    Disposition:   FU with me in one year.    Signed, Minus Breeding, MD  05/20/2020 2:03 PM    [place]

## 2020-05-25 ENCOUNTER — Encounter: Payer: Self-pay | Admitting: Family Medicine

## 2020-05-25 NOTE — Addendum Note (Signed)
Addended by: Shellia Cleverly on: 05/25/2020 11:59 AM   Modules accepted: Orders

## 2020-06-08 ENCOUNTER — Emergency Department (HOSPITAL_COMMUNITY): Payer: Medicare Other

## 2020-06-08 ENCOUNTER — Telehealth: Payer: Self-pay

## 2020-06-08 ENCOUNTER — Encounter (HOSPITAL_COMMUNITY): Payer: Self-pay | Admitting: Emergency Medicine

## 2020-06-08 ENCOUNTER — Other Ambulatory Visit: Payer: Self-pay

## 2020-06-08 ENCOUNTER — Observation Stay (HOSPITAL_COMMUNITY)
Admission: EM | Admit: 2020-06-08 | Discharge: 2020-06-09 | Disposition: A | Discharge status: Home / Self Care | Payer: Medicare Other | Attending: Internal Medicine | Admitting: Internal Medicine

## 2020-06-08 DIAGNOSIS — K2270 Barrett's esophagus without dysplasia: Secondary | ICD-10-CM

## 2020-06-08 DIAGNOSIS — D539 Nutritional anemia, unspecified: Secondary | ICD-10-CM | POA: Diagnosis not present

## 2020-06-08 DIAGNOSIS — S2242XA Multiple fractures of ribs, left side, initial encounter for closed fracture: Secondary | ICD-10-CM | POA: Diagnosis not present

## 2020-06-08 DIAGNOSIS — Z7982 Long term (current) use of aspirin: Secondary | ICD-10-CM | POA: Insufficient documentation

## 2020-06-08 DIAGNOSIS — R6889 Other general symptoms and signs: Secondary | ICD-10-CM | POA: Diagnosis not present

## 2020-06-08 DIAGNOSIS — E039 Hypothyroidism, unspecified: Secondary | ICD-10-CM | POA: Diagnosis not present

## 2020-06-08 DIAGNOSIS — I712 Thoracic aortic aneurysm, without rupture: Secondary | ICD-10-CM | POA: Diagnosis not present

## 2020-06-08 DIAGNOSIS — R059 Cough, unspecified: Secondary | ICD-10-CM | POA: Diagnosis not present

## 2020-06-08 DIAGNOSIS — Z79899 Other long term (current) drug therapy: Secondary | ICD-10-CM | POA: Diagnosis not present

## 2020-06-08 DIAGNOSIS — J45909 Unspecified asthma, uncomplicated: Secondary | ICD-10-CM | POA: Insufficient documentation

## 2020-06-08 DIAGNOSIS — Z96643 Presence of artificial hip joint, bilateral: Secondary | ICD-10-CM | POA: Diagnosis not present

## 2020-06-08 DIAGNOSIS — I7121 Aneurysm of the ascending aorta, without rupture: Secondary | ICD-10-CM | POA: Diagnosis present

## 2020-06-08 DIAGNOSIS — Z85828 Personal history of other malignant neoplasm of skin: Secondary | ICD-10-CM | POA: Diagnosis not present

## 2020-06-08 DIAGNOSIS — K449 Diaphragmatic hernia without obstruction or gangrene: Secondary | ICD-10-CM

## 2020-06-08 DIAGNOSIS — R042 Hemoptysis: Principal | ICD-10-CM | POA: Diagnosis present

## 2020-06-08 DIAGNOSIS — Z20822 Contact with and (suspected) exposure to covid-19: Secondary | ICD-10-CM | POA: Insufficient documentation

## 2020-06-08 DIAGNOSIS — I10 Essential (primary) hypertension: Secondary | ICD-10-CM | POA: Diagnosis not present

## 2020-06-08 DIAGNOSIS — R000 Tachycardia, unspecified: Secondary | ICD-10-CM | POA: Diagnosis not present

## 2020-06-08 DIAGNOSIS — J449 Chronic obstructive pulmonary disease, unspecified: Secondary | ICD-10-CM | POA: Diagnosis not present

## 2020-06-08 DIAGNOSIS — I2510 Atherosclerotic heart disease of native coronary artery without angina pectoris: Secondary | ICD-10-CM

## 2020-06-08 DIAGNOSIS — Z743 Need for continuous supervision: Secondary | ICD-10-CM | POA: Diagnosis not present

## 2020-06-08 LAB — CBC WITH DIFFERENTIAL/PLATELET
Basophils Absolute: 0 10*3/uL (ref 0.0–0.1)
Basophils Relative: 0 %
Eosinophils Absolute: 0 10*3/uL (ref 0.0–0.5)
Eosinophils Relative: 0 %
HCT: 33.6 % — ABNORMAL LOW (ref 39.0–52.0)
Hemoglobin: 10.8 g/dL — ABNORMAL LOW (ref 13.0–17.0)
Lymphocytes Relative: 15 %
Lymphs Abs: 1.5 10*3/uL (ref 0.7–4.0)
MCH: 32.5 pg (ref 26.0–34.0)
MCHC: 32.1 g/dL (ref 30.0–36.0)
MCV: 101.2 fL — ABNORMAL HIGH (ref 80.0–100.0)
Metamyelocytes Relative: 3 %
Monocytes Absolute: 0.8 10*3/uL (ref 0.1–1.0)
Monocytes Relative: 8 %
Myelocytes: 2 %
Neutro Abs: 7.2 10*3/uL (ref 1.7–7.7)
Neutrophils Relative %: 72 %
Platelets: 225 10*3/uL (ref 150–400)
RBC: 3.32 MIL/uL — ABNORMAL LOW (ref 4.22–5.81)
RDW: 14.8 % (ref 11.5–15.5)
WBC: 10 10*3/uL (ref 4.0–10.5)
nRBC: 0 % (ref 0.0–0.2)

## 2020-06-08 LAB — BASIC METABOLIC PANEL
Anion gap: 7 (ref 5–15)
BUN: 44 mg/dL — ABNORMAL HIGH (ref 8–23)
CO2: 24 mmol/L (ref 22–32)
Calcium: 8.9 mg/dL (ref 8.9–10.3)
Chloride: 107 mmol/L (ref 98–111)
Creatinine, Ser: 0.89 mg/dL (ref 0.61–1.24)
GFR, Estimated: >60 mL/min (ref >60)
Glucose, Bld: 82 mg/dL (ref 70–99)
Potassium: 4.2 mmol/L (ref 3.5–5.1)
Sodium: 138 mmol/L (ref 135–145)

## 2020-06-08 LAB — RESP PANEL BY RT-PCR (FLU A&B, COVID) ARPGX2
Influenza A by PCR: NEGATIVE
Influenza B by PCR: NEGATIVE
SARS Coronavirus 2 by RT PCR: NEGATIVE

## 2020-06-08 MED ORDER — IOHEXOL 300 MG/ML  SOLN
75.0000 mL | Freq: Once | INTRAMUSCULAR | Status: AC | PRN
  Administered 2020-06-08: 75 mL via INTRAVENOUS

## 2020-06-08 MED ORDER — METOPROLOL TARTRATE 25 MG PO TABS
25.0000 mg | ORAL_TABLET | Freq: Once | ORAL | Status: AC
  Administered 2020-06-08: 25 mg via ORAL
  Filled 2020-06-08: qty 1

## 2020-06-08 MED ORDER — SODIUM CHLORIDE 0.9 % IV BOLUS
1000.0000 mL | Freq: Once | INTRAVENOUS | Status: AC
  Administered 2020-06-08: 1000 mL via INTRAVENOUS

## 2020-06-08 NOTE — Telephone Encounter (Signed)
Messaged forwarded to Dettinger. Pt is on his way to the hospital by ambulance.

## 2020-06-08 NOTE — ED Provider Notes (Signed)
[facility] EMERGENCY DEPARTMENT Provider Note   CSN: [id number] Arrival date & time: 06/08/20  1341     History Chief Complaint  Patient presents with  . Hemoptysis    [name] is a 87 y.o. male.  HPI     87 year old male comes in a chief complaint of hemoptysis. He has history of COPD, CAD, Barrett's esophagitis and documented history of ascending aortic aneurysm.  Yesterday patient started having hemoptysis.  He had 2 bouts of moderate volume hemoptysis last night.  Thereafter, the hemoptysis returned after he had breakfast and then ice tea.  He suspects that he is time he has about a tablespoon of output.  The blood is bright red.  He recently had an x-ray of his chest that revealed pulmonary nodules.   Past Medical History:  Diagnosis Date  . Abnormality of gait 06/07/2013  . Anemia   . Aneurysm ([place])    on assending aorta, currently watching it, Dr Cyndia Bent  . Anxiety   . Arthritis   . Asthma   . Atrial fibrillation ([place])   . Barrett's esophagus   . CAD (coronary artery disease) 03/2006   3.0 x 20 mm TAXUS Perseus DES to the LAD; 01/2007  L main 30%, oLAD 50%, pLAD stent ok, CFX 80%, OM 60%, pRCA 60%, mRCA 70%, oPDA 90%; med rx   . Cancer ([place])    skin cancer   . Cataract    bilateral removal of cateracts  . Complication of anesthesia     no issues,but pt prefers spinal due to Pulmonary problems  . COPD (chronic obstructive pulmonary disease) ([place])    oxygen  on standby in home.  . Depression   . Diverticulosis   . Emphysema   . Enlarged prostate with urinary retention   . Esophageal stenosis   . GERD (gastroesophageal reflux disease)   . Hiatal hernia   . Hypothyroidism   . Neuropathy   . Osteoporosis   . Pituitary macroadenoma ([place])   . Restless legs syndrome (RLS)   . UGIB (upper gastrointestinal bleed) 03/2013   EGD w/ large ulcer at GE junction    Patient Active Problem List   Diagnosis Date Noted  . Acquired dilation of ascending  aorta and aortic root ([place]) 05/20/2020  . Chest congestion 05/11/2020  . Metatarsalgia of right foot 08/27/2019  . Memory disorder 05/03/2019  . Chronic constipation 10/17/2018  . Peripheral vascular insufficiency ([place]) 07/19/2017  . Aneurysmal dilatation ([place]) 07/19/2017  . Aortic atherosclerosis ([place]) 11/21/2016  . Thoracic ascending aortic aneurysm ([place]) 08/10/2016  . Atherosclerosis of native coronary artery of native heart without angina pectoris 08/10/2016  . Hiatal hernia 05/04/2016  . Esophageal stricture   . Afib ([place]) 06/12/2015  . Postoperative anemia due to acute blood loss 06/11/2015  . Neuropathy 12/31/2013  . Vitamin B 12 deficiency 12/31/2013  . History of esophageal ulcer 04/20/2013  . S/P left and ritght THA, AA 07/21/2011  . Special screening for malignant neoplasms, colon 01/03/2011  . Personal history of colonic polyps 01/03/2011  . COPD GOLD I 08/24/2010  . COLONIC POLYPS 07/17/2008  . Gastroesophageal reflux disease with esophagitis 07/17/2008  . DEGENERATIVE JOINT DISEASE 07/17/2008  . Osteoporosis 07/17/2008  . BPH (benign prostatic hyperplasia) 07/17/2008  . Hyperprolactinemia ([place]) 10/24/2007  . PITUITARY ADENOMA 06/25/2007  . Hypothyroidism 06/25/2007  . Hyperlipidemia LDL goal <70 06/22/2007  . Coronary atherosclerosis 06/22/2007    Past Surgical History:  Procedure Laterality Date  . ABDOMINAL HERNIA REPAIR  2008  . CARDIAC CATHETERIZATION  01/2007   L main 30%, oLAD 50%,  pLAD stent ok, CFX 80%, OM 60%, pRCA 60%, mRCA 70%, oPDA 90%; med rx  . cataract extraction both eyes    . COLONOSCOPY    . CORONARY STENT PLACEMENT  03/2006   3.0 x 20 mm TAXUS Perseus DES to the LAD  . CYSTOSCOPY N/A 06/10/2015   Procedure: CYSTOSCOPY FULGRATION OF BLEEDING,  electovapor resection;  Surgeon: Irine Seal, MD;  Location: [facility];  Service: Urology;  Laterality: N/A;  . CYSTOSCOPY WITH INSERTION OF UROLIFT N/A 06/01/2015   Procedure: CYSTOSCOPY WITH INSERTION OF  UROLIFT x4;  Surgeon: Irine Seal, MD;  Location: [facility];  Service: Urology;  Laterality: N/A;  . ESOPHAGOGASTRODUODENOSCOPY N/A 04/20/2013   Procedure: ESOPHAGOGASTRODUODENOSCOPY (EGD);  Surgeon: Inda Castle, MD;  [facility];  Service: Endoscopy;  Laterality: N/A;  . ESOPHAGOGASTRODUODENOSCOPY N/A 03/25/2016   Procedure: ESOPHAGOGASTRODUODENOSCOPY (EGD);  Surgeon: Irene Shipper, MD;  Location: [facility];  Service: Endoscopy;  Laterality: N/A;  . ESOPHAGOGASTRODUODENOSCOPY (EGD) WITH PROPOFOL N/A 10/13/2015   Procedure: ESOPHAGOGASTRODUODENOSCOPY (EGD) WITH PROPOFOL;  Surgeon: Jerene Bears, MD;  [facility]: [facility];  Service: Gastroenterology;  Laterality: N/A;  . FOOT SURGERY  1994 left, 2002 right foot   bilateral foot reconstruciton  . FOOT SURGERY     reconstruction of both feet- no retained hardware.  Marland Kitchen HIATAL HERNIA REPAIR  01-04-2008  . MELANOMA EXCISION  2019  . POLYPECTOMY    . PROSTATE SURGERY     x 2  . SAVORY DILATION N/A 03/25/2016   Procedure: SAVORY DILATION;  Surgeon: Irene Shipper, MD;  [facility];  Service: Endoscopy;  Laterality: N/A;  . TOTAL HIP ARTHROPLASTY  07/21/2011   Procedure: TOTAL HIP ARTHROPLASTY ANTERIOR APPROACH;  Surgeon: Mauri Pole, MD;  Location: [facility];  Service: Orthopedics;  Laterality: Left;  . TOTAL HIP ARTHROPLASTY Right 09/07/2012   Procedure: RIGHT TOTAL HIP ARTHROPLASTY ANTERIOR APPROACH;  Surgeon: Mcarthur Rossetti, MD;  Location: [facility];  Service: Orthopedics;  Laterality: Right;  . TRANSURETHRAL RESECTION OF BLADDER NECK N/A 11/04/2015   Procedure: RESECTION OF BLADDER NECK;  Surgeon: Cleon Gustin, MD;  Location: [facility];  Service: Urology;  Laterality: N/A;  . TRANSURETHRAL RESECTION OF PROSTATE N/A 11/04/2015   Procedure: TRANSURETHRAL RESECTION OF THE PROSTATE (TURP); REMOVAL OF UROLIFT IMPLANTS X THREE;  Surgeon: Cleon Gustin, MD;  Location: [facility];  Service: Urology;  Laterality: N/A;  . UPPER  GASTROINTESTINAL ENDOSCOPY  12/2013   Dr Hilarie Fredrickson, gastritis  . VIDEO BRONCHOSCOPY Bilateral 02/01/2017   Procedure: VIDEO BRONCHOSCOPY WITHOUT FLUORO;  Surgeon: Tanda Rockers, MD;  [facility];  Service: Cardiopulmonary;  Laterality: Bilateral;       Family History  Problem Relation Age of Onset  . Heart disease Sister   . Heart failure Father   . Other Mother        phebitis related to Jane's birth, he was 72 weeks old when she died  . Colon cancer Neg Hx   . Esophageal cancer Neg Hx   . Rectal cancer Neg Hx   . Stomach cancer Neg Hx   . Colon polyps Neg Hx     Social History   Tobacco Use  . Smoking status: Never Smoker  . Smokeless tobacco: Never Used  Vaping Use  . Vaping Use: Never used  Substance Use Topics  . Alcohol use: No  . Drug use: No  Home Medications Prior to Admission medications   Medication Sig Start Date End Date Taking? Authorizing Provider  albuterol (PROVENTIL HFA;VENTOLIN HFA) 108 (90 Base) MCG/ACT inhaler Inhale 2 puffs into the lungs every 6 (six) hours as needed for wheezing or shortness of breath. 11/13/17  Yes Tanda Rockers, MD  aspirin EC 81 MG tablet Take 1 tablet (81 mg total) by mouth daily. Swallow whole. 05/20/20  Yes Minus Breeding, MD  bisacodyl (DULCOLAX) 10 MG suppository Place 10 mg rectally as needed for moderate constipation.   Yes [provider]  bromocriptine (PARLODEL) 2.5 MG tablet TAKE  (1)  TABLET TWICE A DAY. Patient taking differently: Take 2.5 mg by mouth See admin instructions. TAKE 1/2 TABLET EVERY MORNING AND 1 TABLET EVERY EVENING 03/16/20  Yes Renato Shin, MD  calcium carbonate (OSCAL) 1500 (600 Ca) MG TABS tablet Take 600 mg of elemental calcium by mouth daily with breakfast.    Yes [provider]  cholecalciferol (VITAMIN D) 1000 UNITS tablet Take 1,000 Units by mouth daily.    Yes [provider]  ferrous sulfate 325 (65 FE) MG tablet Take 1 tablet (325 mg total) by mouth  daily with breakfast. 01/23/19  Yes Dettinger, [name], MD  guaiFENesin (MUCINEX) 600 MG 12 hr tablet Take 600 mg by mouth daily.   Yes [provider]  levalbuterol (XOPENEX) 0.63 MG/3ML nebulizer solution Take 3 mLs (0.63 mg total) by nebulization every 6 (six) hours as needed for wheezing or shortness of breath. 11/06/17  Yes Martyn Ehrich, NP  levothyroxine (SYNTHROID) 75 MCG tablet Take 1 tablet (75 mcg total) by mouth daily. 04/22/20  Yes Dettinger, [name], MD  naphazoline-pheniramine (NAPHCON-A) 0.025-0.3 % ophthalmic solution Place 1 drop into both eyes 2 (two) times daily as needed for irritation or allergies.    Yes [provider]  nitroGLYCERIN (NITROSTAT) 0.4 MG SL tablet Place 1 tablet (0.4 mg total) under the tongue every 5 (five) minutes as needed for chest pain. 08/22/16  Yes Chipper Herb, MD  pantoprazole (PROTONIX) 40 MG tablet 1 - 2 tablets daily 06/06/19  Yes Pyrtle, Lajuan Lines, MD  sucralfate (CARAFATE) 1 g tablet Take 1 tablet three times daily as needed 06/06/19  Yes Pyrtle, Lajuan Lines, MD  SYMBICORT 160-4.5 MCG/ACT inhaler INHALE 2 PUFFS 2 TIMES A DAY Patient taking differently: Inhale 2 puffs into the lungs in the morning and at bedtime. 04/24/19  Yes Tanda Rockers, MD  tamsulosin (FLOMAX) 0.4 MG CAPS capsule Take 1 capsule (0.4 mg total) by mouth daily after supper. 04/22/20  Yes Dettinger, [name], MD  terazosin (HYTRIN) 1 MG capsule Take 1 capsule (1 mg total) by mouth at bedtime. 02/27/20  Yes Dettinger, [name], MD  triamcinolone cream (KENALOG) 0.1 % Apply 1 application topically 2 (two) times daily. 10/31/18  Yes Rakes, Connye Burkitt, FNP  vitamin B-12 (CYANOCOBALAMIN) 1000 MCG tablet Take 1,000 mcg by mouth daily.   Yes [provider]  vitamin E 180 MG (400 UNITS) capsule Take 400 Units by mouth daily.   Yes [provider]  diclofenac Sodium (VOLTAREN) 1 % GEL Apply 2 g topically 4 (four) times daily. 08/27/19   Ivy Lynn, NP  GAVILAX  17 GM/SCOOP powder DISSOLVE 17 GM SCOOP IN 8 OZ. OF WATER AND DRINK ONCE DAILY AS NEEDED FOR CONSTIPATION Patient not taking: No sig reported 10/15/18   Pyrtle, Lajuan Lines, MD  MAGNESIUM CITRATE PO Take by mouth daily. Patient not taking:  No sig reported    [provider]  Respiratory Therapy Supplies (FLUTTER) DEVI Twice a day and prn as needed, may increase if feeling worse 05/01/18   Martyn Ehrich, NP  tobramycin-dexamethasone [facility]) ophthalmic solution Apply 1 drop in affected eye(s) every 2 hours for two days. Then every 4 hours for 5 days. Patient not taking: No sig reported 03/11/19   Claretta Fraise, MD    Allergies    Betadine [povidone iodine], Gadavist [gadobutrol], Lortab [hydrocodone-acetaminophen], Penicillins, Zetia [ezetimibe], and Zocor [simvastatin]  Review of Systems   Review of Systems  Constitutional: Positive for activity change.  Respiratory: Positive for cough. Negative for chest tightness and shortness of breath.   Cardiovascular: Positive for chest pain.  Gastrointestinal: Negative for nausea and vomiting.  Allergic/Immunologic: Negative for immunocompromised state.  Hematological: Does not bruise/bleed easily.  All other systems reviewed and are negative.   Physical Exam Updated Vital Signs BP 138/77   Pulse 91   Temp 98.1 F (36.7 C) (Oral)   Resp 17   Ht 5\' 2"  (1.575 m)   Wt 76.2 kg   SpO2 96%   BMI 30.73 kg/m   Physical Exam Vitals and nursing note reviewed.  Constitutional:      Appearance: He is well-developed.  HENT:     Head: Atraumatic.  Cardiovascular:     Rate and Rhythm: Normal rate.  Pulmonary:     Effort: Pulmonary effort is normal.     Breath sounds: Wheezing and rhonchi present.  Musculoskeletal:     Cervical back: Neck supple.  Skin:    General: Skin is warm.  Neurological:     Mental Status: He is alert and oriented to person, place, and time.     ED Results / Procedures / Treatments   Labs (all labs  ordered are listed, but only abnormal results are displayed) Labs Reviewed  CBC WITH DIFFERENTIAL/PLATELET - Abnormal; Notable for the following components:      Result Value   RBC 3.32 (*)    Hemoglobin 10.8 (*)    HCT 33.6 (*)    MCV 101.2 (*)    All other components within normal limits  BASIC METABOLIC PANEL - Abnormal; Notable for the following components:   BUN 44 (*)    All other components within normal limits  RESP PANEL BY RT-PCR (FLU A&B, COVID) [id number]    EKG EKG Interpretation  Date/Time:  Monday June 08 2020 20:48:53 EDT Ventricular Rate:  95 PR Interval:  140 QRS Duration: 99 QT Interval:  349 QTC Calculation: 439 R Axis:   66 Text Interpretation: Sinus tachycardia Atrial premature complexes RSR' in V1 or V2, right VCD or RVH No acute changes Confirmed by Varney Biles [phone]) on 06/08/2020 9:19:12 PM   Radiology DG Chest 2 View  Result Date: 06/08/2020 CLINICAL DATA:  Hemoptysis EXAM: CHEST - 2 VIEW COMPARISON:  05/11/2020 FINDINGS: The heart size and mediastinal contours are within normal limits. No acute airspace opacity. A previously noted rounded density of the infrahilar right lung is not well appreciated on current examination. Redemonstrated focal eventration of the left hemidiaphragm. The visualized skeletal structures are unremarkable. IMPRESSION: 1.  No acute abnormality of the lungs. 2. A previously noted rounded density of the infrahilar right lung is not well appreciated on current examination. CT remains as previously recommended to exclude malignancy. Electronically Signed   By: Eddie Candle M.D.   On: 06/08/2020 16:21   CT Chest W Contrast  Result Date: 06/08/2020 CLINICAL  DATA:  Hemoptysis since last night. EXAM: CT CHEST WITH CONTRAST TECHNIQUE: Multidetector CT imaging of the chest was performed during intravenous contrast administration. CONTRAST:  56mL OMNIPAQUE IOHEXOL 300 MG/ML  SOLN COMPARISON:  CT chest dated December 26, 2018. FINDINGS:  Cardiovascular: Normal heart size. No pericardial effusion. Unchanged ascending thoracic aortic aneurysm measuring up to 4.4 cm. No dissection. Coronary, aortic arch, and branch vessel atherosclerotic vascular disease. Mediastinum/Nodes: No enlarged mediastinal, hilar, or axillary lymph nodes. Thyroid gland, trachea, and esophagus demonstrate no significant findings. Lungs/Pleura: Unchanged chronic debris and mucoid impaction in both lower lobes, worse on the right. Similar appearing scarring in both lungs, worse in the right lower lobe. No focal consolidation, pleural effusion, or pneumothorax. Bilateral pleural calcifications again noted. Upper Abdomen: No acute abnormality. Postsurgical changes at the GE junction with unchanged small hiatal hernia. Presumably mixing artifact in the portal vein. Musculoskeletal: No chest wall abnormality. No acute or significant osseous findings. Multiple chronic thoracic compression deformities and old left rib fractures again noted. IMPRESSION: 1. No acute intrathoracic process. 2. Unchanged chronic debris and mucoid impaction in both lower lobes, worse on the right. 3. Unchanged 4.4 cm ascending thoracic aortic aneurysm. Recommend annual imaging followup by CTA or MRA. This recommendation follows 2010 ACCF/AHA/AATS/ACR/ASA/SCA/SCAI/SIR/STS/SVM Guidelines for the Diagnosis and Management of Patients with Thoracic Aortic Disease. Circulation. 2010; 121: I696-E952. Aortic aneurysm NOS (ICD10-I71.9) 4. Aortic Atherosclerosis (ICD10-I70.0). Electronically Signed   By: Titus Dubin M.D.   On: 06/08/2020 18:17    Procedures Procedures   Medications Ordered in ED Medications  iohexol (OMNIPAQUE) 300 MG/ML solution 75 mL (75 mLs Intravenous Contrast Given 06/08/20 1746)    ED Course  I have reviewed the triage vital signs and the nursing notes.  Pertinent labs & imaging results that were available during my care of the patient were reviewed by me and considered in my  medical decision making (see chart for details).  Clinical Course as of 06/08/20 2121  Mon Jun 08, 2020  2119 CT is reassuring.  I went to share the results with the patient.  While in the room, patient's heart rate went up to 140s.  Family and the patient is concerned that maybe the bleeding source is actually GI.  Does not appear to me that this is a GI bleed, but if his heart rate is in the 140s, then I am not comfortable discharging him.    Patient reports that he might have been told he has A. fib, is not on any blood thinners. Repeat EKG ordered, heart rate at that time in the 90s.  Will monitor closely. [AN]    Clinical Course User Index [AN] Varney Biles, MD   MDM Rules/Calculators/A&P                          87 year old male with history of thoracic artery aneurysm, COPD comes in a chief complaint of hemoptysis.  He is not on any blood thinners.  He started having some cough followed by bloody phlegm yesterday that has worsened today.  He has had some esophageal stenosis for which she sees GI.  No GI bleed history.  We will get CT chest with contrast.  I reviewed patient's previous x-rays and he had right-sided nodules that he is supposed to get CT for recently.  Final Clinical Impression(s) / ED Diagnoses Final diagnoses:  Hemoptysis    Rx / DC Orders ED Discharge Orders    None  Varney Biles, MD 06/08/20 2121

## 2020-06-08 NOTE — H&P (Incomplete)
History and Physical  [name]:[id number] DOB: Oct 24, 1932 DOA: 06/08/2020  Referring physician: ***  PCP: Dettinger, [name], MD  Outpatient Specialists: *** Patient coming from: *** & is able to ambulate ***  Chief Complaint: ***   HPI: [name] is a 87 y.o. male with medical history significant for *** (For level 3, the HPI must include 4+ descriptors: Location, Quality, Severity, Duration, Timing, Context, modifying factors, associated signs/symptoms and/or status of 3+ chronic problems.)   ED Course: ***  Review of Systems: Review of systems as noted in the HPI. All other systems reviewed and are negative.   Past Medical History:  Diagnosis Date  . Abnormality of gait 06/07/2013  . Anemia   . Aneurysm ([place])    on assending aorta, currently watching it, Dr Cyndia Bent  . Anxiety   . Arthritis   . Asthma   . Atrial fibrillation ([place])   . Barrett's esophagus   . CAD (coronary artery disease) 03/2006   3.0 x 20 mm TAXUS Perseus DES to the LAD; 01/2007  L main 30%, oLAD 50%, pLAD stent ok, CFX 80%, OM 60%, pRCA 60%, mRCA 70%, oPDA 90%; med rx   . Cancer ([place])    skin cancer   . Cataract    bilateral removal of cateracts  . Complication of anesthesia     no issues,but pt prefers spinal due to Pulmonary problems  . COPD (chronic obstructive pulmonary disease) ([place])    oxygen  on standby in home.  . Depression   . Diverticulosis   . Emphysema   . Enlarged prostate with urinary retention   . Esophageal stenosis   . GERD (gastroesophageal reflux disease)   . Hiatal hernia   . Hypothyroidism   . Neuropathy   . Osteoporosis   . Pituitary macroadenoma ([place])   . Restless legs syndrome (RLS)   . UGIB (upper gastrointestinal bleed) 03/2013   EGD w/ large ulcer at GE junction   Past Surgical History:  Procedure Laterality Date  . ABDOMINAL HERNIA REPAIR   2008  . CARDIAC CATHETERIZATION  01/2007   L main 30%, oLAD 50%,  pLAD stent ok, CFX 80%, OM 60%, pRCA  60%, mRCA 70%, oPDA 90%; med rx  . cataract extraction both eyes    . COLONOSCOPY    . CORONARY STENT PLACEMENT  03/2006   3.0 x 20 mm TAXUS Perseus DES to the LAD  . CYSTOSCOPY N/A 06/10/2015   Procedure: CYSTOSCOPY FULGRATION OF BLEEDING,  electovapor resection;  Surgeon: Irine Seal, MD;  Location: [facility];  Service: Urology;  Laterality: N/A;  . CYSTOSCOPY WITH INSERTION OF UROLIFT N/A 06/01/2015   Procedure: CYSTOSCOPY WITH INSERTION OF UROLIFT x4;  Surgeon: Irine Seal, MD;  Location: [facility];  Service: Urology;  Laterality: N/A;  . ESOPHAGOGASTRODUODENOSCOPY N/A 04/20/2013   Procedure: ESOPHAGOGASTRODUODENOSCOPY (EGD);  Surgeon: Inda Castle, MD;  Location: [facility];  Service: Endoscopy;  Laterality: N/A;  . ESOPHAGOGASTRODUODENOSCOPY N/A 03/25/2016   Procedure: ESOPHAGOGASTRODUODENOSCOPY (EGD);  Surgeon: Irene Shipper, MD;  Location: [facility];  Service: Endoscopy;  Laterality: N/A;  . ESOPHAGOGASTRODUODENOSCOPY (EGD) WITH PROPOFOL N/A 10/13/2015   Procedure: ESOPHAGOGASTRODUODENOSCOPY (EGD) WITH PROPOFOL;  Surgeon: Jerene Bears, MD;  [facility]: [facility];  Service: Gastroenterology;  Laterality: N/A;  . FOOT SURGERY  1994 left, 2002 right foot   bilateral foot reconstruciton  . FOOT SURGERY     reconstruction of both feet- no retained hardware.  Marland Kitchen HIATAL HERNIA REPAIR  01-04-2008  .  MELANOMA EXCISION  2019  . POLYPECTOMY    . PROSTATE SURGERY     x 2  . SAVORY DILATION N/A 03/25/2016   Procedure: SAVORY DILATION;  Surgeon: Irene Shipper, MD;  [facility]: [facility];  Service: Endoscopy;  Laterality: N/A;  . TOTAL HIP ARTHROPLASTY  07/21/2011   Procedure: TOTAL HIP ARTHROPLASTY ANTERIOR APPROACH;  Surgeon: Mauri Pole, MD;  Location: [facility];  Service: Orthopedics;  Laterality: Left;  . TOTAL HIP ARTHROPLASTY Right 09/07/2012   Procedure: RIGHT TOTAL HIP ARTHROPLASTY ANTERIOR APPROACH;  Surgeon: Mcarthur Rossetti, MD;  Location: [facility];  Service: Orthopedics;  Laterality:  Right;  . TRANSURETHRAL RESECTION OF BLADDER NECK N/A 11/04/2015   Procedure: RESECTION OF BLADDER NECK;  Surgeon: Cleon Gustin, MD;  Location: [facility];  Service: Urology;  Laterality: N/A;  . TRANSURETHRAL RESECTION OF PROSTATE N/A 11/04/2015   Procedure: TRANSURETHRAL RESECTION OF THE PROSTATE (TURP); REMOVAL OF UROLIFT IMPLANTS X THREE;  Surgeon: Cleon Gustin, MD;  Location: [facility];  Service: Urology;  Laterality: N/A;  . UPPER GASTROINTESTINAL ENDOSCOPY  12/2013   Dr Hilarie Fredrickson, gastritis  . VIDEO BRONCHOSCOPY Bilateral 02/01/2017   Procedure: VIDEO BRONCHOSCOPY WITHOUT FLUORO;  Surgeon: Tanda Rockers, MD;  [facility];  Service: Cardiopulmonary;  Laterality: Bilateral;    Social History:  reports that he has never smoked. He has never used smokeless tobacco. He reports that he does not drink alcohol and does not use drugs.   Allergies  Allergen Reactions  . Betadine [Povidone Iodine] Other (See Comments)    Reaction:  Blisters   . Gadavist [Gadobutrol]     hives  . Lortab [Hydrocodone-Acetaminophen] Nausea And Vomiting  . Penicillins Other (See Comments)    Reaction:  Lightheadedness  Has patient had a PCN reaction causing immediate rash, facial/tongue/throat swelling, SOB or lightheadedness with hypotension: Yes Has patient had a PCN reaction causing severe rash involving mucus membranes or skin necrosis: No Has patient had a PCN reaction that required hospitalization No Has patient had a PCN reaction occurring within the last 10 years: No If all of the above answers are "NO", then may proceed with Cephalosporin use.  Marland Kitchen Zetia [Ezetimibe] Other (See Comments)    Reaction:  Muscle weakness   . Zocor [Simvastatin] Nausea Only and Other (See Comments)    Reaction:  Muscle weakness     Family History  Problem Relation Age of Onset  . Heart disease Sister   . Heart failure Father   . Other Mother        phebitis related to Grayden's birth, he was 10 weeks old when she  died  . Colon cancer Neg Hx   . Esophageal cancer Neg Hx   . Rectal cancer Neg Hx   . Stomach cancer Neg Hx   . Colon polyps Neg Hx     ***  Prior to Admission medications   Medication Sig Start Date End Date Taking? Authorizing Provider  albuterol (PROVENTIL HFA;VENTOLIN HFA) 108 (90 Base) MCG/ACT inhaler Inhale 2 puffs into the lungs every 6 (six) hours as needed for wheezing or shortness of breath. 11/13/17  Yes Tanda Rockers, MD  aspirin EC 81 MG tablet Take 1 tablet (81 mg total) by mouth daily. Swallow whole. 05/20/20  Yes Minus Breeding, MD  bisacodyl (DULCOLAX) 10 MG suppository Place 10 mg rectally as needed for moderate constipation.   Yes [provider]  bromocriptine (PARLODEL) 2.5 MG tablet TAKE  (1)  TABLET TWICE  A DAY. Patient taking differently: Take 2.5 mg by mouth See admin instructions. TAKE 1/2 TABLET EVERY MORNING AND 1 TABLET EVERY EVENING 03/16/20  Yes Renato Shin, MD  calcium carbonate (OSCAL) 1500 (600 Ca) MG TABS tablet Take 600 mg of elemental calcium by mouth daily with breakfast.    Yes [provider]  cholecalciferol (VITAMIN D) 1000 UNITS tablet Take 1,000 Units by mouth daily.    Yes [provider]  ferrous sulfate 325 (65 FE) MG tablet Take 1 tablet (325 mg total) by mouth daily with breakfast. 01/23/19  Yes Dettinger, [name], MD  guaiFENesin (MUCINEX) 600 MG 12 hr tablet Take 600 mg by mouth daily.   Yes [provider]  levalbuterol (XOPENEX) 0.63 MG/3ML nebulizer solution Take 3 mLs (0.63 mg total) by nebulization every 6 (six) hours as needed for wheezing or shortness of breath. 11/06/17  Yes Martyn Ehrich, NP  levothyroxine (SYNTHROID) 75 MCG tablet Take 1 tablet (75 mcg total) by mouth daily. 04/22/20  Yes Dettinger, [name], MD  naphazoline-pheniramine (NAPHCON-A) 0.025-0.3 % ophthalmic solution Place 1 drop into both eyes 2 (two) times daily as needed for irritation or allergies.    Yes [provider]  nitroGLYCERIN (NITROSTAT) 0.4 MG SL tablet Place 1 tablet (0.4 mg total) under the tongue every 5 (five) minutes as needed for chest pain. 08/22/16  Yes Chipper Herb, MD  pantoprazole (PROTONIX) 40 MG tablet 1 - 2 tablets daily 06/06/19  Yes Pyrtle, Lajuan Lines, MD  sucralfate (CARAFATE) 1 g tablet Take 1 tablet three times daily as needed 06/06/19  Yes Pyrtle, Lajuan Lines, MD  SYMBICORT 160-4.5 MCG/ACT inhaler INHALE 2 PUFFS 2 TIMES A DAY Patient taking differently: Inhale 2 puffs into the lungs in the morning and at bedtime. 04/24/19  Yes Tanda Rockers, MD  tamsulosin (FLOMAX) 0.4 MG CAPS capsule Take 1 capsule (0.4 mg total) by mouth daily after supper. 04/22/20  Yes Dettinger, [name], MD  terazosin (HYTRIN) 1 MG capsule Take 1 capsule (1 mg total) by mouth at bedtime. 02/27/20  Yes Dettinger, [name], MD  triamcinolone cream (KENALOG) 0.1 % Apply 1 application topically 2 (two) times daily. 10/31/18  Yes Rakes, Connye Burkitt, FNP  vitamin B-12 (CYANOCOBALAMIN) 1000 MCG tablet Take 1,000 mcg by mouth daily.   Yes [provider]  vitamin E 180 MG (400 UNITS) capsule Take 400 Units by mouth daily.   Yes [provider]  diclofenac Sodium (VOLTAREN) 1 % GEL Apply 2 g topically 4 (four) times daily. 08/27/19   Ivy Lynn, NP  GAVILAX 17 GM/SCOOP powder DISSOLVE 17 GM SCOOP IN 8 OZ. OF WATER AND DRINK ONCE DAILY AS NEEDED FOR CONSTIPATION Patient not taking: No sig reported 10/15/18   Pyrtle, Lajuan Lines, MD  MAGNESIUM CITRATE PO Take by mouth daily. Patient not taking: No sig reported    [provider]  Respiratory Therapy Supplies (FLUTTER) DEVI Twice a day and prn as needed, may increase if feeling worse 05/01/18   Martyn Ehrich, NP  tobramycin-dexamethasone [facility]) ophthalmic solution Apply 1 drop in affected eye(s) every 2 hours for two days. Then every 4 hours for 5 days. Patient not taking: No sig reported 03/11/19   Claretta Fraise, MD    Physical Exam: BP 133/68   Pulse  (!) 124   Temp 98.1 F (36.7 C) (Oral)   Resp (!) 26   Ht 5\' 2"  (1.575 m)   Wt 76.2 kg  SpO2 92%   BMI 30.73 kg/m   . General: 86 y.o. year-old male well developed well nourished in no acute distress.  Alert and oriented x3. . Cardiovascular: Regular rate and rhythm with no rubs or gallops.  No thyromegaly or JVD noted.  No lower extremity edema. 2/4 pulses in all 4 extremities. Marland Kitchen Respiratory: Clear to auscultation with no wheezes or rales. Good inspiratory effort. . Abdomen: Soft nontender nondistended with normal bowel sounds x4 quadrants. . Muskuloskeletal: No cyanosis, clubbing or edema noted bilaterally . Neuro: CN II-XII intact, strength, sensation, reflexes . Skin: No ulcerative lesions noted or rashes . Psychiatry: Judgement and insight appear normal. Mood is appropriate for condition and setting          Labs on Admission:  Basic Metabolic Panel: Recent Labs  Lab 06/08/20 1359  NA 138  K 4.2  CL 107  CO2 24  GLUCOSE 82  BUN 44*  CREATININE 0.89  CALCIUM 8.9   Liver Function Tests: No results for input(s): AST, ALT, ALKPHOS, BILITOT, PROT, ALBUMIN in the last 168 hours. No results for input(s): LIPASE, AMYLASE in the last 168 hours. No results for input(s): AMMONIA in the last 168 hours. CBC: Recent Labs  Lab 06/08/20 1359  WBC 10.0  NEUTROABS 7.2  HGB 10.8*  HCT 33.6*  MCV 101.2*  PLT 225   Cardiac Enzymes: No results for input(s): CKTOTAL, CKMB, CKMBINDEX, TROPONINI in the last 168 hours.  BNP (last 3 results) No results for input(s): BNP in the last 8760 hours.  ProBNP (last 3 results) No results for input(s): PROBNP in the last 8760 hours.  CBG: No results for input(s): GLUCAP in the last 168 hours.  Radiological Exams on Admission: DG Chest 2 View  Result Date: 06/08/2020 CLINICAL DATA:  Hemoptysis EXAM: CHEST - 2 VIEW COMPARISON:  05/11/2020 FINDINGS: The heart size and mediastinal contours are within normal limits. No acute airspace  opacity. A previously noted rounded density of the infrahilar right lung is not well appreciated on current examination. Redemonstrated focal eventration of the left hemidiaphragm. The visualized skeletal structures are unremarkable. IMPRESSION: 1.  No acute abnormality of the lungs. 2. A previously noted rounded density of the infrahilar right lung is not well appreciated on current examination. CT remains as previously recommended to exclude malignancy. Electronically Signed   By: Eddie Candle M.D.   On: 06/08/2020 16:21   CT Chest W Contrast  Result Date: 06/08/2020 CLINICAL DATA:  Hemoptysis since last night. EXAM: CT CHEST WITH CONTRAST TECHNIQUE: Multidetector CT imaging of the chest was performed during intravenous contrast administration. CONTRAST:  59mL OMNIPAQUE IOHEXOL 300 MG/ML  SOLN COMPARISON:  CT chest dated December 26, 2018. FINDINGS: Cardiovascular: Normal heart size. No pericardial effusion. Unchanged ascending thoracic aortic aneurysm measuring up to 4.4 cm. No dissection. Coronary, aortic arch, and branch vessel atherosclerotic vascular disease. Mediastinum/Nodes: No enlarged mediastinal, hilar, or axillary lymph nodes. Thyroid gland, trachea, and esophagus demonstrate no significant findings. Lungs/Pleura: Unchanged chronic debris and mucoid impaction in both lower lobes, worse on the right. Similar appearing scarring in both lungs, worse in the right lower lobe. No focal consolidation, pleural effusion, or pneumothorax. Bilateral pleural calcifications again noted. Upper Abdomen: No acute abnormality. Postsurgical changes at the GE junction with unchanged small hiatal hernia. Presumably mixing artifact in the portal vein. Musculoskeletal: No chest wall abnormality. No acute or significant osseous findings. Multiple chronic thoracic compression deformities and old left rib fractures again noted. IMPRESSION: 1. No acute intrathoracic process.  2. Unchanged chronic debris and mucoid impaction  in both lower lobes, worse on the right. 3. Unchanged 4.4 cm ascending thoracic aortic aneurysm. Recommend annual imaging followup by CTA or MRA. This recommendation follows 2010 ACCF/AHA/AATS/ACR/ASA/SCA/SCAI/SIR/STS/SVM Guidelines for the Diagnosis and Management of Patients with Thoracic Aortic Disease. Circulation. 2010; 121: Z182-U990. Aortic aneurysm NOS (ICD10-I71.9) 4. Aortic Atherosclerosis (ICD10-I70.0). Electronically Signed   By: Titus Dubin M.D.   On: 06/08/2020 18:17    EKG: I independently viewed the EKG done and my findings are as followed: ***   Assessment/Plan Present on Admission: . Hemoptysis  Active Problems:   Hemoptysis   DVT prophylaxis: ***   Code Status: ***   Family Communication: ***   Disposition Plan: ***   Consults called: ***   Admission status: ***     Bernadette Hoit MD [facility] Pager [phone]  If 7PM-7AM, please contact night-coverage [email or web address] Password [facility]  06/08/2020, 10:28 PM

## 2020-06-08 NOTE — ED Notes (Signed)
Went to room for rounding pt in X-ray

## 2020-06-08 NOTE — ED Triage Notes (Signed)
Pt recently was told he has "spots" on his lungs. Awaiting CT scan. Recently started coughing up blood.

## 2020-06-09 ENCOUNTER — Ambulatory Visit (HOSPITAL_COMMUNITY): Payer: Medicare Other

## 2020-06-09 ENCOUNTER — Observation Stay (HOSPITAL_COMMUNITY): Payer: Medicare Other

## 2020-06-09 DIAGNOSIS — I712 Thoracic aortic aneurysm, without rupture: Secondary | ICD-10-CM | POA: Diagnosis not present

## 2020-06-09 DIAGNOSIS — K3189 Other diseases of stomach and duodenum: Secondary | ICD-10-CM | POA: Diagnosis not present

## 2020-06-09 DIAGNOSIS — I2510 Atherosclerotic heart disease of native coronary artery without angina pectoris: Secondary | ICD-10-CM

## 2020-06-09 DIAGNOSIS — Z888 Allergy status to other drugs, medicaments and biological substances status: Secondary | ICD-10-CM | POA: Diagnosis not present

## 2020-06-09 DIAGNOSIS — K449 Diaphragmatic hernia without obstruction or gangrene: Secondary | ICD-10-CM | POA: Diagnosis not present

## 2020-06-09 DIAGNOSIS — I700 Atherosclerosis of aorta: Secondary | ICD-10-CM | POA: Diagnosis not present

## 2020-06-09 DIAGNOSIS — R0789 Other chest pain: Secondary | ICD-10-CM | POA: Diagnosis not present

## 2020-06-09 DIAGNOSIS — R042 Hemoptysis: Secondary | ICD-10-CM

## 2020-06-09 DIAGNOSIS — D649 Anemia, unspecified: Secondary | ICD-10-CM | POA: Diagnosis not present

## 2020-06-09 DIAGNOSIS — Q438 Other specified congenital malformations of intestine: Secondary | ICD-10-CM | POA: Diagnosis not present

## 2020-06-09 DIAGNOSIS — Z880 Allergy status to penicillin: Secondary | ICD-10-CM | POA: Diagnosis not present

## 2020-06-09 DIAGNOSIS — K635 Polyp of colon: Secondary | ICD-10-CM | POA: Diagnosis not present

## 2020-06-09 DIAGNOSIS — K6389 Other specified diseases of intestine: Secondary | ICD-10-CM | POA: Diagnosis not present

## 2020-06-09 DIAGNOSIS — E039 Hypothyroidism, unspecified: Secondary | ICD-10-CM | POA: Diagnosis not present

## 2020-06-09 DIAGNOSIS — K5730 Diverticulosis of large intestine without perforation or abscess without bleeding: Secondary | ICD-10-CM | POA: Diagnosis not present

## 2020-06-09 DIAGNOSIS — Z85820 Personal history of malignant melanoma of skin: Secondary | ICD-10-CM | POA: Diagnosis not present

## 2020-06-09 DIAGNOSIS — Z8249 Family history of ischemic heart disease and other diseases of the circulatory system: Secondary | ICD-10-CM | POA: Diagnosis not present

## 2020-06-09 DIAGNOSIS — I4891 Unspecified atrial fibrillation: Secondary | ICD-10-CM | POA: Diagnosis not present

## 2020-06-09 DIAGNOSIS — R000 Tachycardia, unspecified: Secondary | ICD-10-CM | POA: Diagnosis not present

## 2020-06-09 DIAGNOSIS — J9811 Atelectasis: Secondary | ICD-10-CM | POA: Diagnosis not present

## 2020-06-09 DIAGNOSIS — D509 Iron deficiency anemia, unspecified: Secondary | ICD-10-CM | POA: Diagnosis not present

## 2020-06-09 DIAGNOSIS — R195 Other fecal abnormalities: Secondary | ICD-10-CM | POA: Diagnosis not present

## 2020-06-09 DIAGNOSIS — Z955 Presence of coronary angioplasty implant and graft: Secondary | ICD-10-CM | POA: Diagnosis not present

## 2020-06-09 DIAGNOSIS — J189 Pneumonia, unspecified organism: Secondary | ICD-10-CM | POA: Diagnosis present

## 2020-06-09 DIAGNOSIS — Z8711 Personal history of peptic ulcer disease: Secondary | ICD-10-CM | POA: Diagnosis not present

## 2020-06-09 DIAGNOSIS — M810 Age-related osteoporosis without current pathological fracture: Secondary | ICD-10-CM | POA: Diagnosis not present

## 2020-06-09 DIAGNOSIS — Z743 Need for continuous supervision: Secondary | ICD-10-CM | POA: Diagnosis not present

## 2020-06-09 DIAGNOSIS — K2100 Gastro-esophageal reflux disease with esophagitis, without bleeding: Secondary | ICD-10-CM | POA: Diagnosis not present

## 2020-06-09 DIAGNOSIS — N400 Enlarged prostate without lower urinary tract symptoms: Secondary | ICD-10-CM | POA: Diagnosis present

## 2020-06-09 DIAGNOSIS — J449 Chronic obstructive pulmonary disease, unspecified: Secondary | ICD-10-CM | POA: Diagnosis not present

## 2020-06-09 DIAGNOSIS — K2270 Barrett's esophagus without dysplasia: Secondary | ICD-10-CM

## 2020-06-09 DIAGNOSIS — K219 Gastro-esophageal reflux disease without esophagitis: Secondary | ICD-10-CM | POA: Diagnosis not present

## 2020-06-09 DIAGNOSIS — K2101 Gastro-esophageal reflux disease with esophagitis, with bleeding: Secondary | ICD-10-CM | POA: Diagnosis not present

## 2020-06-09 DIAGNOSIS — D122 Benign neoplasm of ascending colon: Secondary | ICD-10-CM | POA: Diagnosis not present

## 2020-06-09 DIAGNOSIS — K922 Gastrointestinal hemorrhage, unspecified: Secondary | ICD-10-CM | POA: Diagnosis not present

## 2020-06-09 DIAGNOSIS — K633 Ulcer of intestine: Secondary | ICD-10-CM | POA: Diagnosis not present

## 2020-06-09 DIAGNOSIS — F32A Depression, unspecified: Secondary | ICD-10-CM | POA: Diagnosis not present

## 2020-06-09 DIAGNOSIS — R0602 Shortness of breath: Secondary | ICD-10-CM | POA: Diagnosis not present

## 2020-06-09 DIAGNOSIS — Z96643 Presence of artificial hip joint, bilateral: Secondary | ICD-10-CM | POA: Diagnosis not present

## 2020-06-09 DIAGNOSIS — R079 Chest pain, unspecified: Secondary | ICD-10-CM | POA: Diagnosis not present

## 2020-06-09 DIAGNOSIS — J440 Chronic obstructive pulmonary disease with acute lower respiratory infection: Secondary | ICD-10-CM | POA: Diagnosis not present

## 2020-06-09 DIAGNOSIS — Z20822 Contact with and (suspected) exposure to covid-19: Secondary | ICD-10-CM | POA: Diagnosis not present

## 2020-06-09 DIAGNOSIS — R059 Cough, unspecified: Secondary | ICD-10-CM | POA: Diagnosis not present

## 2020-06-09 LAB — COMPREHENSIVE METABOLIC PANEL
ALT: 16 U/L (ref 0–44)
AST: 18 U/L (ref 15–41)
Albumin: 2.8 g/dL — ABNORMAL LOW (ref 3.5–5.0)
Alkaline Phosphatase: 41 U/L (ref 38–126)
Anion gap: 7 (ref 5–15)
BUN: 44 mg/dL — ABNORMAL HIGH (ref 8–23)
CO2: 24 mmol/L (ref 22–32)
Calcium: 8 mg/dL — ABNORMAL LOW (ref 8.9–10.3)
Chloride: 110 mmol/L (ref 98–111)
Creatinine, Ser: 0.88 mg/dL (ref 0.61–1.24)
GFR, Estimated: >60 mL/min (ref >60)
Glucose, Bld: 86 mg/dL (ref 70–99)
Potassium: 4.3 mmol/L (ref 3.5–5.1)
Sodium: 141 mmol/L (ref 135–145)
Total Bilirubin: 0.3 mg/dL (ref 0.3–1.2)
Total Protein: 5 g/dL — ABNORMAL LOW (ref 6.5–8.1)

## 2020-06-09 LAB — CBC
HCT: 26.3 % — ABNORMAL LOW (ref 39.0–52.0)
Hemoglobin: 8.6 g/dL — ABNORMAL LOW (ref 13.0–17.0)
MCH: 32.7 pg (ref 26.0–34.0)
MCHC: 32.7 g/dL (ref 30.0–36.0)
MCV: 100 fL (ref 80.0–100.0)
Platelets: 199 10*3/uL (ref 150–400)
RBC: 2.63 MIL/uL — ABNORMAL LOW (ref 4.22–5.81)
RDW: 14.8 % (ref 11.5–15.5)
WBC: 9.9 10*3/uL (ref 4.0–10.5)
nRBC: 0 % (ref 0.0–0.2)

## 2020-06-09 LAB — FOLATE: Folate: 9.3 ng/mL (ref >5.9)

## 2020-06-09 LAB — MAGNESIUM: Magnesium: 1.8 mg/dL (ref 1.7–2.4)

## 2020-06-09 LAB — PROTIME-INR
INR: 1.1 (ref 0.8–1.2)
Prothrombin Time: 14 seconds (ref 11.4–15.2)

## 2020-06-09 LAB — PHOSPHORUS: Phosphorus: 2.8 mg/dL (ref 2.5–4.6)

## 2020-06-09 LAB — VITAMIN B12: Vitamin B-12: 221 pg/mL (ref 180–914)

## 2020-06-09 LAB — APTT: aPTT: 33 seconds (ref 24–36)

## 2020-06-09 LAB — D-DIMER, QUANTITATIVE: D-Dimer, Quant: 1.12 ug/mL-FEU — ABNORMAL HIGH (ref 0.00–0.50)

## 2020-06-09 MED ORDER — BROMOCRIPTINE MESYLATE 2.5 MG PO TABS
2.5000 mg | ORAL_TABLET | Freq: Every day | ORAL | Status: DC
  Filled 2020-06-09: qty 1

## 2020-06-09 MED ORDER — ACETAMINOPHEN 325 MG PO TABS: 650.0000 mg | ORAL_TABLET | Freq: Four times a day (QID) | ORAL | Status: DC | PRN

## 2020-06-09 MED ORDER — LEVOTHYROXINE SODIUM 75 MCG PO TABS
75.0000 ug | ORAL_TABLET | Freq: Every day | ORAL | Status: DC
  Administered 2020-06-09: 75 ug via ORAL
  Filled 2020-06-09: qty 1

## 2020-06-09 MED ORDER — SODIUM CHLORIDE 0.9 % IV SOLN: INTRAVENOUS | Status: DC

## 2020-06-09 MED ORDER — VITAMIN B-12 1000 MCG PO TABS
1000.0000 ug | ORAL_TABLET | Freq: Every day | ORAL | Status: DC
  Administered 2020-06-09: 1000 ug via ORAL
  Filled 2020-06-09: qty 1

## 2020-06-09 MED ORDER — GUAIFENESIN ER 600 MG PO TB12
600.0000 mg | ORAL_TABLET | Freq: Every day | ORAL | Status: DC
  Administered 2020-06-09: 600 mg via ORAL
  Filled 2020-06-09: qty 1

## 2020-06-09 MED ORDER — MOMETASONE FURO-FORMOTEROL FUM 200-5 MCG/ACT IN AERO
2.0000 | INHALATION_SPRAY | Freq: Two times a day (BID) | RESPIRATORY_TRACT | Status: DC
  Administered 2020-06-09: 2 via RESPIRATORY_TRACT
  Filled 2020-06-09: qty 8.8

## 2020-06-09 MED ORDER — BROMOCRIPTINE MESYLATE 2.5 MG PO TABS: 2.5000 mg | ORAL_TABLET | ORAL | Status: DC

## 2020-06-09 MED ORDER — IOHEXOL 350 MG/ML SOLN
100.0000 mL | Freq: Once | INTRAVENOUS | Status: AC | PRN
  Administered 2020-06-09: 75 mL via INTRAVENOUS

## 2020-06-09 MED ORDER — BROMOCRIPTINE MESYLATE 2.5 MG PO TABS
1.2500 mg | ORAL_TABLET | Freq: Every day | ORAL | Status: DC
  Administered 2020-06-09: 1.25 mg via ORAL
  Filled 2020-06-09 (×2): qty 1

## 2020-06-09 MED ORDER — PANTOPRAZOLE SODIUM 40 MG PO TBEC
40.0000 mg | DELAYED_RELEASE_TABLET | Freq: Every day | ORAL | Status: DC
  Administered 2020-06-09: 40 mg via ORAL
  Filled 2020-06-09: qty 1

## 2020-06-09 MED ORDER — LEVALBUTEROL HCL 0.63 MG/3ML IN NEBU: 0.6300 mg | INHALATION_SOLUTION | Freq: Four times a day (QID) | RESPIRATORY_TRACT | Status: DC | PRN

## 2020-06-09 MED ORDER — ALBUTEROL SULFATE HFA 108 (90 BASE) MCG/ACT IN AERS: 2.0000 | INHALATION_SPRAY | Freq: Four times a day (QID) | RESPIRATORY_TRACT | Status: DC | PRN

## 2020-06-09 MED ORDER — TERAZOSIN HCL 1 MG PO CAPS: 1.0000 mg | ORAL_CAPSULE | Freq: Every day | ORAL | Status: DC

## 2020-06-09 MED ORDER — ONDANSETRON HCL 4 MG/2ML IJ SOLN: 4.0000 mg | Freq: Four times a day (QID) | INTRAMUSCULAR | Status: DC | PRN

## 2020-06-09 MED ORDER — SUCRALFATE 1 G PO TABS
1.0000 g | ORAL_TABLET | Freq: Three times a day (TID) | ORAL | Status: DC
  Administered 2020-06-09 (×2): 1 g via ORAL
  Filled 2020-06-09 (×2): qty 1

## 2020-06-09 NOTE — H&P (Signed)
History and Physical  [name] DOB: January 08, 1933 DOA: 06/08/2020  Referring physician: Varney Biles, MD PCP: Dettinger, [name], MD  Patient coming from: Home  Chief Complaint: Coughing up blood  HPI: [name] is a 87 y.o. male with medical history significant for COPD, hypothyroidism, GERD, A. fib, Barrett's esophagus, CAD s/p stent placement and obesity who presents to the emergency department due to coughing up blood which was first noted yesterday.  First episode was last night when he had significant bright red blood after coughing, he had another cough about 30 minutes after, this time, the blood appears to be residual from the first episode of cough.  He went to bed and woke up this morning without any distress, he had breakfast and Ice tea, shortly after this, he coughed and also noted bright red blood which was about a tablespoonful and associated with dizziness and some weakness, patient then decided to go to the ED for further evaluation and management. He recently had an x-ray of his chest that revealed pulmonary nodules.  He denies fever, chills, shortness of breath or chest pain.  ED Course:  In the emergency department, he was hemodynamically stable.  BP was 150/84.  Work-up in the ED showed macrocytic anemia, BUN 44.  Influenza A, B and SARS coronavirus 2 was negative. CT of chest with contrast showed no acute intrathoracic process.  Unchanged 4.4 cm the ascending thoracic aortic aneurysm was noted. Chest x-ray showed no acute abnormality IV hydration was given, Lopressor 25 Mg x1 was given.  Hospitalist was asked to admit patient for further evaluation and management.  Review of Systems: Constitutional: Negative for chills and fever.  HENT: Negative for ear pain and sore throat.   Eyes: Negative for pain and visual disturbance.  Respiratory: Positive for cough with bloody sputum.,   Negative for chest tightness and shortness of breath.  Cardiovascular: Negative for chest pain and palpitations.  Gastrointestinal: Negative for abdominal pain and vomiting.  Endocrine: Negative for polyphagia and polyuria.  Genitourinary: Negative for decreased urine volume, dysuria, enuresis Musculoskeletal: Negative for arthralgias and back pain.  Skin: Negative for color change and rash.  Allergic/Immunologic: Negative for immunocompromised state.  Neurological: Negative for tremors, syncope, speech difficulty, weakness, light-headedness and headaches.  Hematological: Does not bruise/bleed easily.  All other systems reviewed and are negative    Past Medical History:  Diagnosis Date  . Abnormality of gait 06/07/2013  . Anemia   . Aneurysm ([place])    on assending aorta, currently watching it, Dr Cyndia Bent  . Anxiety   . Arthritis   . Asthma   . Atrial fibrillation ([place])   . Barrett's esophagus   . CAD (coronary artery disease) 03/2006   3.0 x 20 mm TAXUS Perseus DES to the LAD; 01/2007  L main 30%, oLAD 50%, pLAD stent ok, CFX 80%, OM 60%, pRCA 60%, mRCA 70%, oPDA 90%; med rx   . Cancer ([place])    skin cancer   . Cataract    bilateral removal of cateracts  . Complication of anesthesia     no issues,but pt prefers spinal due to Pulmonary problems  . COPD (chronic obstructive pulmonary disease) ([place])    oxygen  on standby in home.  . Depression   . Diverticulosis   . Emphysema   . Enlarged prostate with urinary retention   . Esophageal stenosis   . GERD (gastroesophageal reflux disease)   . Hiatal hernia   . Hypothyroidism   .  Neuropathy   . Osteoporosis   . Pituitary macroadenoma ([place])   . Restless legs syndrome (RLS)   . UGIB (upper gastrointestinal bleed) 03/2013   EGD w/ large ulcer at GE junction   Past Surgical History:  Procedure Laterality Date  . ABDOMINAL HERNIA REPAIR   2008  . CARDIAC CATHETERIZATION  01/2007   L main 30%, oLAD 50%,  pLAD stent ok, CFX 80%, OM 60%, pRCA 60%, mRCA 70%, oPDA 90%; med rx  . cataract  extraction both eyes    . COLONOSCOPY    . CORONARY STENT PLACEMENT  03/2006   3.0 x 20 mm TAXUS Perseus DES to the LAD  . CYSTOSCOPY N/A 06/10/2015   Procedure: CYSTOSCOPY FULGRATION OF BLEEDING,  electovapor resection;  Surgeon: Irine Seal, MD;  Location: [facility];  Service: Urology;  Laterality: N/A;  . CYSTOSCOPY WITH INSERTION OF UROLIFT N/A 06/01/2015   Procedure: CYSTOSCOPY WITH INSERTION OF UROLIFT x4;  Surgeon: Irine Seal, MD;  Location: [facility];  Service: Urology;  Laterality: N/A;  . ESOPHAGOGASTRODUODENOSCOPY N/A 04/20/2013   Procedure: ESOPHAGOGASTRODUODENOSCOPY (EGD);  Surgeon: Inda Castle, MD;  Location: [facility];  Service: Endoscopy;  Laterality: N/A;  . ESOPHAGOGASTRODUODENOSCOPY N/A 03/25/2016   Procedure: ESOPHAGOGASTRODUODENOSCOPY (EGD);  Surgeon: Irene Shipper, MD;  Location: [facility];  Service: Endoscopy;  Laterality: N/A;  . ESOPHAGOGASTRODUODENOSCOPY (EGD) WITH PROPOFOL N/A 10/13/2015   Procedure: ESOPHAGOGASTRODUODENOSCOPY (EGD) WITH PROPOFOL;  Surgeon: Jerene Bears, MD;  [facility]: [facility];  Service: Gastroenterology;  Laterality: N/A;  . FOOT SURGERY  1994 left, 2002 right foot   bilateral foot reconstruciton  . FOOT SURGERY     reconstruction of both feet- no retained hardware.  Marland Kitchen HERNIA REPAIR    . HIATAL HERNIA REPAIR  01-04-2008  . JOINT REPLACEMENT    . MELANOMA EXCISION  2019  . POLYPECTOMY    . PROSTATE SURGERY     x 2  . SAVORY DILATION N/A 03/25/2016   Procedure: SAVORY DILATION;  Surgeon: Irene Shipper, MD;  [facility];  Service: Endoscopy;  Laterality: N/A;  . TOTAL HIP ARTHROPLASTY  07/21/2011   Procedure: TOTAL HIP ARTHROPLASTY ANTERIOR APPROACH;  Surgeon: Mauri Pole, MD;  Location: [facility];  Service: Orthopedics;  Laterality: Left;  . TOTAL HIP ARTHROPLASTY Right 09/07/2012   Procedure: RIGHT TOTAL HIP ARTHROPLASTY ANTERIOR APPROACH;  Surgeon: Mcarthur Rossetti, MD;  Location: [facility];  Service: Orthopedics;  Laterality:  Right;  . TRANSURETHRAL RESECTION OF BLADDER NECK N/A 11/04/2015   Procedure: RESECTION OF BLADDER NECK;  Surgeon: Cleon Gustin, MD;  Location: [facility];  Service: Urology;  Laterality: N/A;  . TRANSURETHRAL RESECTION OF PROSTATE N/A 11/04/2015   Procedure: TRANSURETHRAL RESECTION OF THE PROSTATE (TURP); REMOVAL OF UROLIFT IMPLANTS X THREE;  Surgeon: Cleon Gustin, MD;  Location: [facility];  Service: Urology;  Laterality: N/A;  . UPPER GASTROINTESTINAL ENDOSCOPY  12/2013   Dr Hilarie Fredrickson, gastritis  . VIDEO BRONCHOSCOPY Bilateral 02/01/2017   Procedure: VIDEO BRONCHOSCOPY WITHOUT FLUORO;  Surgeon: Tanda Rockers, MD;  Location[facility];  Service: Cardiopulmonary;  Laterality: Bilateral;    Social History:  reports that he has never smoked. He has never used smokeless tobacco. He reports that he does not drink alcohol and does not use drugs.   Allergies  Allergen Reactions  . Betadine [Povidone Iodine] Other (See Comments)    Reaction:  Blisters   . Gadavist [Gadobutrol]     hives  . Lortab [Hydrocodone-Acetaminophen] Nausea And  Vomiting  . Penicillins Other (See Comments)    Reaction:  Lightheadedness  Has patient had a PCN reaction causing immediate rash, facial/tongue/throat swelling, SOB or lightheadedness with hypotension: Yes Has patient had a PCN reaction causing severe rash involving mucus membranes or skin necrosis: No Has patient had a PCN reaction that required hospitalization No Has patient had a PCN reaction occurring within the last 10 years: No If all of the above answers are "NO", then may proceed with Cephalosporin use.  Marland Kitchen Zetia [Ezetimibe] Other (See Comments)    Reaction:  Muscle weakness   . Zocor [Simvastatin] Nausea Only and Other (See Comments)    Reaction:  Muscle weakness     Family History  Problem Relation Age of Onset  . Heart disease Sister   . Heart failure Father   . Other Mother        phebitis related to Trayvion's birth, he was 44 weeks old when she  died  . Colon cancer Neg Hx   . Esophageal cancer Neg Hx   . Rectal cancer Neg Hx   . Stomach cancer Neg Hx   . Colon polyps Neg Hx      Prior to Admission medications   Medication Sig Start Date End Date Taking? Authorizing Provider  albuterol (PROVENTIL HFA;VENTOLIN HFA) 108 (90 Base) MCG/ACT inhaler Inhale 2 puffs into the lungs every 6 (six) hours as needed for wheezing or shortness of breath. 11/13/17  Yes Tanda Rockers, MD  aspirin EC 81 MG tablet Take 1 tablet (81 mg total) by mouth daily. Swallow whole. 05/20/20  Yes Minus Breeding, MD  bisacodyl (DULCOLAX) 10 MG suppository Place 10 mg rectally as needed for moderate constipation.   Yes [provider]  bromocriptine (PARLODEL) 2.5 MG tablet TAKE  (1)  TABLET TWICE A DAY. Patient taking differently: Take 2.5 mg by mouth See admin instructions. TAKE 1/2 TABLET EVERY MORNING AND 1 TABLET EVERY EVENING 03/16/20  Yes Renato Shin, MD  calcium carbonate (OSCAL) 1500 (600 Ca) MG TABS tablet Take 600 mg of elemental calcium by mouth daily with breakfast.    Yes [provider]  cholecalciferol (VITAMIN D) 1000 UNITS tablet Take 1,000 Units by mouth daily.    Yes [provider]  ferrous sulfate 325 (65 FE) MG tablet Take 1 tablet (325 mg total) by mouth daily with breakfast. 01/23/19  Yes Dettinger, [name], MD  guaiFENesin (MUCINEX) 600 MG 12 hr tablet Take 600 mg by mouth daily.   Yes [provider]  levalbuterol (XOPENEX) 0.63 MG/3ML nebulizer solution Take 3 mLs (0.63 mg total) by nebulization every 6 (six) hours as needed for wheezing or shortness of breath. 11/06/17  Yes Martyn Ehrich, NP  levothyroxine (SYNTHROID) 75 MCG tablet Take 1 tablet (75 mcg total) by mouth daily. 04/22/20  Yes Dettinger, [name], MD  naphazoline-pheniramine (NAPHCON-A) 0.025-0.3 % ophthalmic solution Place 1 drop into both eyes 2 (two) times daily as needed for irritation or allergies.    Yes [provider]   nitroGLYCERIN (NITROSTAT) 0.4 MG SL tablet Place 1 tablet (0.4 mg total) under the tongue every 5 (five) minutes as needed for chest pain. 08/22/16  Yes Chipper Herb, MD  pantoprazole (PROTONIX) 40 MG tablet 1 - 2 tablets daily 06/06/19  Yes Pyrtle, Lajuan Lines, MD  sucralfate (CARAFATE) 1 g tablet Take 1 tablet three times daily as needed 06/06/19  Yes Pyrtle, Lajuan Lines, MD  SYMBICORT 160-4.5 MCG/ACT inhaler INHALE 2 PUFFS 2  TIMES A DAY Patient taking differently: Inhale 2 puffs into the lungs in the morning and at bedtime. 04/24/19  Yes Tanda Rockers, MD  tamsulosin (FLOMAX) 0.4 MG CAPS capsule Take 1 capsule (0.4 mg total) by mouth daily after supper. 04/22/20  Yes Dettinger, [name], MD  terazosin (HYTRIN) 1 MG capsule Take 1 capsule (1 mg total) by mouth at bedtime. 02/27/20  Yes Dettinger, [name], MD  triamcinolone cream (KENALOG) 0.1 % Apply 1 application topically 2 (two) times daily. 10/31/18  Yes Rakes, Connye Burkitt, FNP  vitamin B-12 (CYANOCOBALAMIN) 1000 MCG tablet Take 1,000 mcg by mouth daily.   Yes [provider]  vitamin E 180 MG (400 UNITS) capsule Take 400 Units by mouth daily.   Yes [provider]  diclofenac Sodium (VOLTAREN) 1 % GEL Apply 2 g topically 4 (four) times daily. 08/27/19   Ivy Lynn, NP  GAVILAX 17 GM/SCOOP powder DISSOLVE 17 GM SCOOP IN 8 OZ. OF WATER AND DRINK ONCE DAILY AS NEEDED FOR CONSTIPATION Patient not taking: No sig reported 10/15/18   Pyrtle, Lajuan Lines, MD  MAGNESIUM CITRATE PO Take by mouth daily. Patient not taking: No sig reported    [provider]  Respiratory Therapy Supplies (FLUTTER) DEVI Twice a day and prn as needed, may increase if feeling worse 05/01/18   Martyn Ehrich, NP  tobramycin-dexamethasone [facility]) ophthalmic solution Apply 1 drop in affected eye(s) every 2 hours for two days. Then every 4 hours for 5 days. Patient not taking: No sig reported 03/11/19   Claretta Fraise, MD    Physical Exam: BP 140/68 (BP  Location: Left Arm)   Pulse 87   Temp 98.2 F (36.8 C) (Oral)   Resp 20   Ht 5\' 2"  (1.575 m)   Wt 76.2 kg   SpO2 100%   BMI 30.73 kg/m   . General: 87 y.o. year-old male well developed well nourished in no acute distress.  Alert and oriented x3. Marland Kitchen HEENT: NCAT, EOMI . Neck: Supple, trachea medial . Cardiovascular: Regular rate and rhythm with no rubs or gallops.  No thyromegaly or JVD noted.  No lower extremity edema. 2/4 pulses in all 4 extremities. Marland Kitchen Respiratory: Mild scattered wheezes on auscultation.  No rales . Abdomen: Soft nontender nondistended with normal bowel sounds x4 quadrants. . Muskuloskeletal: No cyanosis, clubbing or edema noted bilaterally . Neuro: CN II-XII intact, strength 5/5 x 4, sensation, reflexes intact . Skin: No ulcerative lesions noted or rashes . Psychiatry: Judgement and insight appear normal. Mood is appropriate for condition and setting          Labs on Admission:  Basic Metabolic Panel: Recent Labs  Lab 06/08/20 1359  NA 138  K 4.2  CL 107  CO2 24  GLUCOSE 82  BUN 44*  CREATININE 0.89  CALCIUM 8.9   Liver Function Tests: No results for input(s): AST, ALT, ALKPHOS, BILITOT, PROT, ALBUMIN in the last 168 hours. No results for input(s): LIPASE, AMYLASE in the last 168 hours. No results for input(s): AMMONIA in the last 168 hours. CBC: Recent Labs  Lab 06/08/20 1359  WBC 10.0  NEUTROABS 7.2  HGB 10.8*  HCT 33.6*  MCV 101.2*  PLT 225   Cardiac Enzymes: No results for input(s): CKTOTAL, CKMB, CKMBINDEX, TROPONINI in the last 168 hours.  BNP (last 3 results) No results for input(s): BNP in the last 8760 hours.  ProBNP (last 3 results) No results for input(s): PROBNP in the last 8760  hours.  CBG: No results for input(s): GLUCAP in the last 168 hours.  Radiological Exams on Admission: DG Chest 2 View  Result Date: 06/08/2020 CLINICAL DATA:  Hemoptysis EXAM: CHEST - 2 VIEW COMPARISON:  05/11/2020 FINDINGS: The heart size and  mediastinal contours are within normal limits. No acute airspace opacity. A previously noted rounded density of the infrahilar right lung is not well appreciated on current examination. Redemonstrated focal eventration of the left hemidiaphragm. The visualized skeletal structures are unremarkable. IMPRESSION: 1.  No acute abnormality of the lungs. 2. A previously noted rounded density of the infrahilar right lung is not well appreciated on current examination. CT remains as previously recommended to exclude malignancy. Electronically Signed   By: Eddie Candle M.D.   On: 06/08/2020 16:21   CT Chest W Contrast  Result Date: 06/08/2020 CLINICAL DATA:  Hemoptysis since last night. EXAM: CT CHEST WITH CONTRAST TECHNIQUE: Multidetector CT imaging of the chest was performed during intravenous contrast administration. CONTRAST:  16mL OMNIPAQUE IOHEXOL 300 MG/ML  SOLN COMPARISON:  CT chest dated December 26, 2018. FINDINGS: Cardiovascular: Normal heart size. No pericardial effusion. Unchanged ascending thoracic aortic aneurysm measuring up to 4.4 cm. No dissection. Coronary, aortic arch, and branch vessel atherosclerotic vascular disease. Mediastinum/Nodes: No enlarged mediastinal, hilar, or axillary lymph nodes. Thyroid gland, trachea, and esophagus demonstrate no significant findings. Lungs/Pleura: Unchanged chronic debris and mucoid impaction in both lower lobes, worse on the right. Similar appearing scarring in both lungs, worse in the right lower lobe. No focal consolidation, pleural effusion, or pneumothorax. Bilateral pleural calcifications again noted. Upper Abdomen: No acute abnormality. Postsurgical changes at the GE junction with unchanged small hiatal hernia. Presumably mixing artifact in the portal vein. Musculoskeletal: No chest wall abnormality. No acute or significant osseous findings. Multiple chronic thoracic compression deformities and old left rib fractures again noted. IMPRESSION: 1. No acute  intrathoracic process. 2. Unchanged chronic debris and mucoid impaction in both lower lobes, worse on the right. 3. Unchanged 4.4 cm ascending thoracic aortic aneurysm. Recommend annual imaging followup by CTA or MRA. This recommendation follows 2010 ACCF/AHA/AATS/ACR/ASA/SCA/SCAI/SIR/STS/SVM Guidelines for the Diagnosis and Management of Patients with Thoracic Aortic Disease. Circulation. 2010; 121: H299-M426. Aortic aneurysm NOS (ICD10-I71.9) 4. Aortic Atherosclerosis (ICD10-I70.0). Electronically Signed   By: Titus Dubin M.D.   On: 06/08/2020 18:17    EKG: I independently viewed the EKG done and my findings are as followed: Sinus tachycardia with APCs  Assessment/Plan Present on Admission: . Hemoptysis . Thoracic ascending aortic aneurysm ([place]) . Hypothyroidism  Principal Problem:   Hemoptysis Active Problems:   Hypothyroidism   Macrocytic anemia   COPD (chronic obstructive pulmonary disease) ([facility])   Hiatal hernia   Thoracic ascending aortic aneurysm ([facility])   CAD (coronary artery disease)   Barrett's esophagus  Hemoptysis Continue to monitor patient for further hemoptysis Patient may require outpatient follow-up with a pulmonologist  Macrocytic anemia Folate and vitamin B12 levels will be checked  Hiatal hernia/Barrett's esophagus Continue Protonix, Carafate  COPD Continue Ventolin, Mucinex, Xopenex and Dulera per home regimen  Hypothyroidism Continue Synthroid  Essential hypertension Continue Terazosin  History of thoracic aortic aneurysm CT chest with contrast showed Unchanged 4.4 cm ascending thoracic aortic aneurysm. Annual imaging followup by CTA or MRA recommended  BPH Continue terazosin  DVT prophylaxis: SCDs  Code Status: Full code  Family Communication: None at bedside  Disposition Plan:  Patient is from:  home Anticipated DC to:                    home Anticipated DC date:               1 day Anticipated DC barriers:            Patient requires overnight observation due to hemoptysis  Consults called: None  Admission status: Observation  Bernadette Hoit MD [facility]  06/09/2020, 2:51 AM

## 2020-06-09 NOTE — Progress Notes (Signed)
06/09/2020 [name] [id number] 1932/12/15   Chief Complaint: Hemoptysis   History of Present Illness: [name] is an 87 year old male with a past medical history of anxiety, arthritis, CAD s/p DES to LAD 2008, atrial fibrillation, ascending aortic aneurysm, hypothyroidism, pituitary adenoma, vitamin B 12 deficiency, hiatal hernia, GERD, benign esophageal stenosis, Barrett's esophagus, UGIB secondary to ulcer at the GE junction 2015. He presented to [facility] hospital ED on 06/08/2020 with complaints of hemoptysis. Sunday 06/07/2020 he was using his "flutter valve" apparatus to loosen his mucus with history of COPD.  Shortly after, he coughed up a moderate amount of bright red blood in the toilet.  Approximately 30  minutes later, he coughed up a small amount of dry red blood.  He stated he definitely coughed up the blood.  No vomiting.  On Monday 06/08/2020,  he had a productive cough which was clear.  He ate breakfast and shortly after coughed up a small amount of bright red blood.  Around noon time, he swallowed a few sips of tea and coughed up about 1 tablespoon of bright red blood.  He called 911 and he was transported to the [facility] for further evaluation.  Labs in the ED showed a hemoglobin level 10.8 (baseline Hg 12.2 on 04/22/2020)l BUN 44 (base line BUN 23).  A chest x-ray showed a rounded density of the infrahilar right lung as seen on a prior chest xray done 3/14.  A chest CTwith contrast showed chronic debris/mucoid impaction in both lower lobes worse on the right and an unchanged 4.4 cm ascending thoracic aortic aneurysm.  No acute intrathoracic process was identified.  A chest CTA 06/09/2020 also showed chronic mucoid impaction in the lower lobe bronchi with bibasilar atelectasis, No evidence of a PE, 4.3 cm ascending thoracic aortic aneurysm without dissection and evidence of CAD.  He had a trace amount of hemoptysis on the evening 06/08/2020 without recurrence while  observed overnight in the ED.  His hemoglobin level dropped to 8.6.  He had a brief run of SVT observed in the ED but remained dynamically stable.  He was discharged home with instructions to follow-up with his pulmonologist Dr. Loanne Drilling scheduled on 06/17/2020.  [name] presents today accompanied by his friend Pamala Hurry for further GI evaluation.  He is well-known to Dr. Hilarie Fredrickson.  He has a history of GERD, Barrett's esophagus and esophageal stenosis requiring numerous EGDs with esophageal dilatations and UGI bleed secondary to an ulcer at the [place] junction in 2015. He complains of having acid reflux described as a burning discomfort to his mid esophagus for the past month. No dysphagia. He is having some nausea, no vomiting.  He is taking Pantoprazole 40 mg once daily and Carafate 1 g p.o. once daily.  No hematemesis. + hemoptysis as noted above. He complains of feeling weak. He has chronic SOB which seems worse over the past few weeks but is not any worse since he was discharged from the ED yesterday.  No chest pain, palpitations or dizziness.  He is on aspirin 81 mg daily and he did not take it today.  No other NSAID use.  He is not on any blood thinners.  His most recent EGD was 08/16/2017 which again showed a benign-appearing esophageal stenosis which was dilated, 3 cm hiatal hernia, the stomach and duodenum were normal.  He reports passing a brown solid stool every third day.  He sometimes strains as the first part of  his stool is hard and the remainder of the stool is soft.  He denies having any rectal bleeding or black-colored stools.  He passed a very large brown bowel movement on 06/09/2020.  He is on Ferrous Sulfate 325 mg once daily which he thinks he started approximately 1 month ago as prescribed by Dr. Warrick Parisian. However, he stated he is taking the Ferrous Sulfate approximately 2 days weekly and he has not taken any for the past 3 days as it gave him stomach upset.  He underwent a colonoscopy in 2012  which showed left-sided diverticulosis, no polyps.   CBC Latest Ref Rng & Units 06/09/2020 06/08/2020 04/22/2020  WBC 4.0 - 10.5 K/uL 9.9 10.0 10.0  Hemoglobin 13.0 - 17.0 g/dL 8.6(L) 10.8(L) 12.2(L)  Hematocrit 39.0 - 52.0 % 26.3(L) 33.6(L) 36.9(L)  Platelets 150 - 400 K/uL 199 225 246  MCV 100  CMP Latest Ref Rng & Units 06/09/2020 06/08/2020 04/22/2020  Glucose 70 - 99 mg/dL 86 82 83  BUN 8 - 23 mg/dL 44(H) 44(H) 23  Creatinine 0.61 - 1.24 mg/dL 0.88 0.89 1.05  Sodium 135 - 145 mmol/L 141 138 142  Potassium 3.5 - 5.1 mmol/L 4.3 4.2 4.4  Chloride 98 - 111 mmol/L 110 107 105  CO2 22 - 32 mmol/L 24 24 21   Calcium 8.9 - 10.3 mg/dL 8.0(L) 8.9 9.1  Total Protein 6.5 - 8.1 g/dL 5.0(L) - 6.6  Total Bilirubin 0.3 - 1.2 mg/dL 0.3 - 0.3  Alkaline Phos 38 - 126 U/L 41 - 79  AST 15 - 41 U/L 18 - 22  ALT 0 - 44 U/L 16 - 15    Chest CT with contrast 06/08/2020: 1. No acute intrathoracic process. 2. Unchanged chronic debris and mucoid impaction in both lower lobes, worse on the right. 3. Unchanged 4.4 cm ascending thoracic aortic aneurysm. 4. Aortic Atherosclerosis   Chest CTA 06/09/2020: No evidence of pulmonary embolus. Coronary artery disease. Chronic mucoid impaction in the lower lobe bronchi with bibasilar atelectasis. Stable chronic elevation of the left hemidiaphragm. 4.3 cm ascending thoracic aortic aneurysm. Recommend annual imaging followup by CTA or MRA.  EGD 08/16/2017: - Benign-appearing esophageal stenosis. Dilated to 15.5 mm. - 3 cm hiatal hernia. - Normal stomach. - Normal examined duodenum. - No specimens collected.  EGD 07/05/2016: Benign-appearing esophageal stenosis. Dilated to 15.5 mm. - 5 cm hiatal hernia. - Normal stomach Normal examined duodenum. - No specimens collected.  EGD 05/03/2016, 03/25/2016 and 10/13/2015  EGD 01/27/2014: 1. There was a stricture with ulceration 34 cm from the incisors; multiple biopsies were performed 2. 4 cm hiatal hernia 3.  Erosive gastritis (inflammation) was found on the greater curvature of the gastric body; multiple biopsies were performed 4. The duodenal mucosa showed no abnormalities in the bulb and 2nd part of the duodenum 1. Surgical [P], gastric body, biopsy - BENIGN GASTRIC MUCOSA WITH CHRONIC ACTIVE GASTRITIS AND REACTIVE EPITHELIAL CHANGES. - NO INTESTINAL METAPLASIA OR HELICOBACTER PYLORI ORGANISMS IDENTIFIED. 2. Surgical [P], esophageal stricture, biopsy - INTESTINAL METAPLASIA (GOBLET CELL METAPLASIA) CONSISTENT WITH BARRETT'S ESOPHAGUS. - ULCERATION WITH ASSOCIATED FIBRINOPURULENT MATERIAL. - NO DYSPLASIA OR MALIGNANCY IDENTIFIED  EGD 04/20/2013: At the GE junction there was a large clean-based ulcer that was partially circumferential.  There was slight oozing at the fringes of the ulcer.  Surrounding mucosa was erythematous but otherwise normal. A 3 cm hiatal hernia was present.  The remainder of the upper endoscopy exam was otherwise normal.  Retroflexed views revealed no abnormalities.  The scope  was then withdrawn from the patient and the procedure completed. Impression: Acute esophageal ulcer.  Colonoscopy 01/03/2011: Left-sided diverticulosis No polyps   Current Outpatient Medications on File Prior to Visit  Medication Sig Dispense Refill  . albuterol (PROVENTIL HFA;VENTOLIN HFA) 108 (90 Base) MCG/ACT inhaler Inhale 2 puffs into the lungs every 6 (six) hours as needed for wheezing or shortness of breath. 18 g 2  . aspirin EC 81 MG tablet Take 1 tablet (81 mg total) by mouth daily. Swallow whole. 90 tablet 3  . bisacodyl (DULCOLAX) 10 MG suppository Place 10 mg rectally as needed for moderate constipation.    . bromocriptine (PARLODEL) 2.5 MG tablet TAKE  (1)  TABLET TWICE A DAY. (Patient taking differently: Take 2.5 mg by mouth See admin instructions. TAKE 1/2 TABLET EVERY MORNING AND 1 TABLET EVERY EVENING) 60 tablet 0  . calcium carbonate (OSCAL) 1500 (600 Ca) MG TABS tablet Take 600  mg of elemental calcium by mouth daily with breakfast.     . cholecalciferol (VITAMIN D) 1000 UNITS tablet Take 1,000 Units by mouth daily.     . diclofenac Sodium (VOLTAREN) 1 % GEL Apply 2 g topically 4 (four) times daily. 50 g 1  . ferrous sulfate 325 (65 FE) MG tablet Take 1 tablet (325 mg total) by mouth daily with breakfast. 90 tablet 3  . guaiFENesin (MUCINEX) 600 MG 12 hr tablet Take 600 mg by mouth daily.    Marland Kitchen levalbuterol (XOPENEX) 0.63 MG/3ML nebulizer solution Take 3 mLs (0.63 mg total) by nebulization every 6 (six) hours as needed for wheezing or shortness of breath. 72 mL 1  . levothyroxine (SYNTHROID) 75 MCG tablet Take 1 tablet (75 mcg total) by mouth daily. 90 tablet 3  . naphazoline-pheniramine (NAPHCON-A) 0.025-0.3 % ophthalmic solution Place 1 drop into both eyes 2 (two) times daily as needed for irritation or allergies.     . nitroGLYCERIN (NITROSTAT) 0.4 MG SL tablet Place 1 tablet (0.4 mg total) under the tongue every 5 (five) minutes as needed for chest pain. 25 tablet 2  . Respiratory Therapy Supplies (FLUTTER) DEVI Twice a day and prn as needed, may increase if feeling worse 1 each 0  . sucralfate (CARAFATE) 1 g tablet Take 1 tablet three times daily as needed 270 tablet 3  . SYMBICORT 160-4.5 MCG/ACT inhaler INHALE 2 PUFFS 2 TIMES A DAY 10.2 g 5  . tamsulosin (FLOMAX) 0.4 MG CAPS capsule Take 1 capsule (0.4 mg total) by mouth daily after supper. 90 capsule 3  . terazosin (HYTRIN) 1 MG capsule Take 1 capsule (1 mg total) by mouth at bedtime. 90 capsule 3  . triamcinolone cream (KENALOG) 0.1 % Apply 1 application topically 2 (two) times daily. 30 g 0  . vitamin B-12 (CYANOCOBALAMIN) 1000 MCG tablet Take 1,000 mcg by mouth daily.    . vitamin E 180 MG (400 UNITS) capsule Take 400 Units by mouth daily.     No current facility-administered medications on file prior to visit.   Allergies  Allergen Reactions  . Betadine [Povidone Iodine] Other (See Comments)    Reaction:   Blisters   . Gadavist [Gadobutrol]     hives  . Lortab [Hydrocodone-Acetaminophen] Nausea And Vomiting  . Penicillins Other (See Comments)    Reaction:  Lightheadedness  Has patient had a PCN reaction causing immediate rash, facial/tongue/throat swelling, SOB or lightheadedness with hypotension: Yes Has patient had a PCN reaction causing severe rash involving mucus membranes or skin necrosis: No Has patient  had a PCN reaction that required hospitalization No Has patient had a PCN reaction occurring within the last 10 years: No If all of the above answers are "NO", then may proceed with Cephalosporin use.  Marland Kitchen Zetia [Ezetimibe] Other (See Comments)    Reaction:  Muscle weakness   . Zocor [Simvastatin] Nausea Only and Other (See Comments)    Reaction:  Muscle weakness     Current Medications, Allergies, Past Medical History, Past Surgical History, Family History and Social History were reviewed in [employer] record.  Review of Systems:   Constitutional: Negative for fever, sweats, chills or weight loss.  Respiratory: Negative for shortness of breath.   Cardiovascular: Negative for chest pain, palpitations and leg swelling.  Gastrointestinal: See HPI.  Musculoskeletal: Negative for back pain or muscle aches.  Neurological: Negative for dizziness, headaches or paresthesias.   Physical Exam: There were no vitals taken for this visit.  BP 104/62 (BP Location: Left Arm, Patient Position: Sitting, Cuff Size: Normal)   Pulse 97   Ht 5' 4.5" (1.638 m)   Wt 166 lb 2 oz (75.4 kg)   SpO2 97%   BMI 28.07 kg/m    General: Frail appearing alert 87 year old male in no acute distress. Head: Normocephalic and atraumatic. Eyes: No scleral icterus. Conjunctiva pink . Ears: Normal auditory acuity. Mouth: Dentition intact. No ulcers or lesions.  Lungs: Clear throughout to auscultation. Heart: Regular rate and rhythm, no murmur. Abdomen: Soft, nontender and nondistended. No  masses or hepatomegaly. Normal bowel sounds x 4 quadrants.  Rectal: No external hemorrhoids. Scant amount of watery black stool in the rectal vault grossly guaiac +. Sophia CMA present during exam.  Musculoskeletal: Symmetrical with no gross deformities. Extremities: No edema. Neurological: Alert oriented x 4. No focal deficits.  Psychological: Alert and cooperative. Normal mood and affect  Assessment and Recommendations:  60.  87 year old male with COPD presented to the ED 4/11 - 06/09/2020 with hemoptysis.  A chest x-ray showed a rounded density of the infrahilar right lung as seen on a prior chest xray done 3/14.  A chest CTwith contrast showed chronic debris/mucoid impaction in both lower lobes worse on the right. No acute intrathoracic process was identified.  A chest CTA 06/09/2020 also showed chronic mucoid impaction in the lower lobe bronchi with bibasilar atelectasis. No evidence of a PE. -Patient is scheduled to see his pulmonologist Dr. Loanne Drilling on 06/24/2020. -Patient instructed to go to the ED if he develops worsening hemoptysis, shortness of breath or chest pain  2. Macrocytic anemia with history of B12 deficiency. Hg level 10.8 -> 8.6 (baseline Hg 12.2 on 04/22/2020). BUN 44 (base line BUN 23). -Repeat H/H and BMP today   3. History of benign esophageal stenosis s/p numerous esophageal dilatations, hiatal hernia, GERD, Barrett's esophagus, UGIB secondary to ulcer at the [place] junction 2015. On rectal exam, scant amount of loose black stool guaiac positive (off po iron x 3 days).  -Increase Pantoprazole 40mg  po bid -Pulmonary clearance required prior to pursing EGD evaluation.  -EGD with Dr. Hilarie Fredrickson at [facility]  to rule out UGI bleed in setting of hemoptysis. Rule out severe esophagitis. Hg 10.8 -> 8.6. Melenic stool on rectal exam (patient possibly swallowing/digested blood which he coughed up and swallowed) -H/H and BMP today -Further recommendations to be determined after the above lab results  reviewed.    4. CAD  5. Atrial fibrillation on ASA  6. CTAP identified a 4.3 cm ascending thoracic aortic aneurysm without  dissection

## 2020-06-09 NOTE — Progress Notes (Signed)
Nsg Discharge Note  Admit Date:  06/08/2020 Discharge date: 06/09/2020   DORN HARTSHORNE to be D/C' Home per MD order.  AVS completed.  Copy for chart, and copy for patient signed, and dated. Patient/caregiver able to verbalize understanding.  Discharge Medication: Allergies as of 06/09/2020      Reactions   Betadine [povidone Iodine] Other (See Comments)   Reaction:  Blisters    Gadavist [gadobutrol]    hives   Lortab [hydrocodone-acetaminophen] Nausea And Vomiting   Penicillins Other (See Comments)   Reaction:  Lightheadedness  Has patient had a PCN reaction causing immediate rash, facial/tongue/throat swelling, SOB or lightheadedness with hypotension: Yes Has patient had a PCN reaction causing severe rash involving mucus membranes or skin necrosis: No Has patient had a PCN reaction that required hospitalization No Has patient had a PCN reaction occurring within the last 10 years: No If all of the above answers are "NO", then may proceed with Cephalosporin use.   Zetia [ezetimibe] Other (See Comments)   Reaction:  Muscle weakness    Zocor [simvastatin] Nausea Only, Other (See Comments)   Reaction:  Muscle weakness       Medication List    STOP taking these medications   GaviLAX 17 GM/SCOOP powder Generic drug: polyethylene glycol powder   MAGNESIUM CITRATE PO   tobramycin-dexamethasone ophthalmic solution Commonly known as: TobraDex     TAKE these medications   albuterol 108 (90 Base) MCG/ACT inhaler Commonly known as: VENTOLIN HFA Inhale 2 puffs into the lungs every 6 (six) hours as needed for wheezing or shortness of breath.   aspirin EC 81 MG tablet Take 1 tablet (81 mg total) by mouth daily. Swallow whole.   bisacodyl 10 MG suppository Commonly known as: DULCOLAX Place 10 mg rectally as needed for moderate constipation.   bromocriptine 2.5 MG tablet Commonly known as: PARLODEL TAKE  (1)  TABLET TWICE A DAY. What changed: See the new instructions.   calcium  carbonate 1500 (600 Ca) MG Tabs tablet Commonly known as: OSCAL Take 600 mg of elemental calcium by mouth daily with breakfast.   cholecalciferol 1000 units tablet Commonly known as: VITAMIN D Take 1,000 Units by mouth daily.   diclofenac Sodium 1 % Gel Commonly known as: VOLTAREN Apply 2 g topically 4 (four) times daily.   ferrous sulfate 325 (65 FE) MG tablet Take 1 tablet (325 mg total) by mouth daily with breakfast.   Flutter Devi Twice a day and prn as needed, may increase if feeling worse   guaiFENesin 600 MG 12 hr tablet Commonly known as: MUCINEX Take 600 mg by mouth daily.   levalbuterol 0.63 MG/3ML nebulizer solution Commonly known as: Xopenex Take 3 mLs (0.63 mg total) by nebulization every 6 (six) hours as needed for wheezing or shortness of breath.   levothyroxine 75 MCG tablet Commonly known as: SYNTHROID Take 1 tablet (75 mcg total) by mouth daily.   naphazoline-pheniramine 0.025-0.3 % ophthalmic solution Commonly known as: NAPHCON-A Place 1 drop into both eyes 2 (two) times daily as needed for irritation or allergies.   nitroGLYCERIN 0.4 MG SL tablet Commonly known as: NITROSTAT Place 1 tablet (0.4 mg total) under the tongue every 5 (five) minutes as needed for chest pain.   pantoprazole 40 MG tablet Commonly known as: PROTONIX 1 - 2 tablets daily   sucralfate 1 g tablet Commonly known as: CARAFATE Take 1 tablet three times daily as needed   Symbicort 160-4.5 MCG/ACT inhaler Generic drug: budesonide-formoterol INHALE 2  PUFFS 2 TIMES A DAY What changed: See the new instructions.   tamsulosin 0.4 MG Caps capsule Commonly known as: FLOMAX Take 1 capsule (0.4 mg total) by mouth daily after supper.   terazosin 1 MG capsule Commonly known as: HYTRIN Take 1 capsule (1 mg total) by mouth at bedtime.   triamcinolone cream 0.1 % Commonly known as: KENALOG Apply 1 application topically 2 (two) times daily.   vitamin B-12 1000 MCG tablet Commonly  known as: CYANOCOBALAMIN Take 1,000 mcg by mouth daily.   vitamin E 180 MG (400 UNITS) capsule Take 400 Units by mouth daily.       Discharge Assessment: Vitals:   06/09/20 1009 06/09/20 1351  BP:  (!) 113/55  Pulse:  75  Resp:  16  Temp: 98 F (36.7 C) 98 F (36.7 C)  SpO2:  99%   Skin clean, dry and intact without evidence of skin break down, no evidence of skin tears noted. IV catheter discontinued intact. Site without signs and symptoms of complications - no redness or edema noted at insertion site, patient denies c/o pain - only slight tenderness at site.  Dressing with slight pressure applied.  D/c Instructions-Education: Discharge instructions given to patient/family with verbalized understanding. D/c education completed with patient/family including follow up instructions, medication list, d/c activities limitations if indicated, with other d/c instructions as indicated by MD - patient able to verbalize understanding, all questions fully answered. Patient instructed to return to ED, call 911, or call MD for any changes in condition.  Patient escorted via [place], and D/C home via private auto.  Clovis Fredrickson, LPN 08/31/5007 3:81 PM

## 2020-06-09 NOTE — Discharge Summary (Signed)
Physician Discharge Summary  [name] MGQ:[id number] DOB: 25-May-1932 DOA: 06/08/2020  PCP: Dettinger, [name], MD  Admit date: 06/08/2020 Discharge date: 06/09/2020  Admitted From: Home Disposition:  Home   Recommendations for Outpatient Follow-up:  1. Follow up with PCP in 1-2 weeks 2. Please obtain BMP/CBC in one week     Discharge Condition: Stable CODE STATUS:  FULL Diet recommendation: Heart Healthy   Brief/Interim Summary: 87 year old male with a history of COPD, peripheral vascular disease, thoracic aortic aneurysm, coronary artery disease with stent, hyperlipidemia, Barrett's esophagus, and hypothyroidism presenting with hemoptysis.  The patient states that he normally coughs very vigorously every night to clear his lungs so he can sleep better.  He has a chronic cough.  He states that he had a moderate amount of hemoptysis on the evening of 06/07/2020.  He subsequently went through his usual coughing routine and had another episode of hemoptysis that evening.  He woke up in the morning of 06/08/2020 and had another small amount of hemoptysis episode.  As result, he presented for further evaluation.  The patient states that he has chronic shortness of breath which is not any worse than usual.  He denies any fevers, chills, weight loss, nausea, vomiting, diarrhea, hematemesis.  He denies any chest pain, headache, neck pain.  He states that he was recently started on a baby aspirin about 1 week prior to this admission.  He denies any other new medications. In the emergency department, the patient had low-grade temperature 99.3 F.  He was hemodynamically stable with oxygen saturation 98-100% room air.  He did have an episode of SVT with heart rates up to 130s.  He spontaneously converted back to sinus rhythm.  CT of the chest on 06/08/2020 showed an unchanged 4.4 cm thoracic aortic aneurysm.  There was no dissection.  There is no lymphadenopathy or consolidation.  There was  unchanged chronic debris and mucoid impaction in the bilateral lower lobes.  He had bilateral scarring and pleural calcifications.  BMP and CBC were essentially unremarkable.  Hemoglobin was 10.8.  EKG shows sinus rhythm with PACs. On the morning of 06/09/2020, the patient remained clinically stable.  He had a trace amount of hemoptysis on the evening of 06/08/2020, but did not have any further hemoptysis with coughing yellow sputum on the morning of 06/09/2020.  CTA chest was obtained and was negative for pulmonary embolus.  He was discharged in stable condition.  He states that he has a follow-up with his pulmonologist, Dr. Loanne Drilling in about 1 to 2 weeks.  Discharge Diagnoses:  Hemoptysis -Small to moderate volume -Likely due to vigorous coughing in the setting of his underlying chronic lung disease -Patient remained stable on room air without any fevers -Patient has follow-up with his pulmonologist in 1 to 2 weeks -06/08/20--CT chest--discussed with radiology, Dr. Johna Roles as above -06/09/20 chest CTA-->no PE; 4.3 cm ascending thoracic aortic aneurysm.  COPD -Continue home bronchodilators -Stable on room air  Coronary artery disease -No chest pain presently -Sinus rhythm, no ST-T wave changes  SVT -Patient had episode of SVT in the emergency department -He remained in sinus rhythm throughout the rest of the hospitalization  Barrett's esophagus -Status post repair -Continue PPI and Carafate  Hypothyroidism -Continue Synthroid  B12 deficiency -Continue B12 supplementation -Serum B12 level 221 -Folate 9.3   BPH -Continue tamsulosin and Terazosin    Discharge Instructions   Allergies as of 06/09/2020      Reactions   Betadine [povidone Iodine] Other (See  Comments)   Reaction:  Blisters    Gadavist [gadobutrol]    hives   Lortab [hydrocodone-acetaminophen] Nausea And Vomiting   Penicillins Other (See Comments)   Reaction:  Lightheadedness  Has patient had a PCN  reaction causing immediate rash, facial/tongue/throat swelling, SOB or lightheadedness with hypotension: Yes Has patient had a PCN reaction causing severe rash involving mucus membranes or skin necrosis: No Has patient had a PCN reaction that required hospitalization No Has patient had a PCN reaction occurring within the last 10 years: No If all of the above answers are "NO", then may proceed with Cephalosporin use.   Zetia [ezetimibe] Other (See Comments)   Reaction:  Muscle weakness    Zocor [simvastatin] Nausea Only, Other (See Comments)   Reaction:  Muscle weakness       Medication List    STOP taking these medications   GaviLAX 17 GM/SCOOP powder Generic drug: polyethylene glycol powder   MAGNESIUM CITRATE PO   tobramycin-dexamethasone ophthalmic solution Commonly known as: TobraDex     TAKE these medications   albuterol 108 (90 Base) MCG/ACT inhaler Commonly known as: VENTOLIN HFA Inhale 2 puffs into the lungs every 6 (six) hours as needed for wheezing or shortness of breath.   aspirin EC 81 MG tablet Take 1 tablet (81 mg total) by mouth daily. Swallow whole.   bisacodyl 10 MG suppository Commonly known as: DULCOLAX Place 10 mg rectally as needed for moderate constipation.   bromocriptine 2.5 MG tablet Commonly known as: PARLODEL TAKE  (1)  TABLET TWICE A DAY. What changed: See the new instructions.   calcium carbonate 1500 (600 Ca) MG Tabs tablet Commonly known as: OSCAL Take 600 mg of elemental calcium by mouth daily with breakfast.   cholecalciferol 1000 units tablet Commonly known as: VITAMIN D Take 1,000 Units by mouth daily.   diclofenac Sodium 1 % Gel Commonly known as: VOLTAREN Apply 2 g topically 4 (four) times daily.   ferrous sulfate 325 (65 FE) MG tablet Take 1 tablet (325 mg total) by mouth daily with breakfast.   Flutter Devi Twice a day and prn as needed, may increase if feeling worse   guaiFENesin 600 MG 12 hr tablet Commonly known as:  MUCINEX Take 600 mg by mouth daily.   levalbuterol 0.63 MG/3ML nebulizer solution Commonly known as: Xopenex Take 3 mLs (0.63 mg total) by nebulization every 6 (six) hours as needed for wheezing or shortness of breath.   levothyroxine 75 MCG tablet Commonly known as: SYNTHROID Take 1 tablet (75 mcg total) by mouth daily.   naphazoline-pheniramine 0.025-0.3 % ophthalmic solution Commonly known as: NAPHCON-A Place 1 drop into both eyes 2 (two) times daily as needed for irritation or allergies.   nitroGLYCERIN 0.4 MG SL tablet Commonly known as: NITROSTAT Place 1 tablet (0.4 mg total) under the tongue every 5 (five) minutes as needed for chest pain.   pantoprazole 40 MG tablet Commonly known as: PROTONIX 1 - 2 tablets daily   sucralfate 1 g tablet Commonly known as: CARAFATE Take 1 tablet three times daily as needed   Symbicort 160-4.5 MCG/ACT inhaler Generic drug: budesonide-formoterol INHALE 2 PUFFS 2 TIMES A DAY What changed: See the new instructions.   tamsulosin 0.4 MG Caps capsule Commonly known as: FLOMAX Take 1 capsule (0.4 mg total) by mouth daily after supper.   terazosin 1 MG capsule Commonly known as: HYTRIN Take 1 capsule (1 mg total) by mouth at bedtime.   triamcinolone cream 0.1 % Commonly known  as: KENALOG Apply 1 application topically 2 (two) times daily.   vitamin B-12 1000 MCG tablet Commonly known as: CYANOCOBALAMIN Take 1,000 mcg by mouth daily.   vitamin E 180 MG (400 UNITS) capsule Take 400 Units by mouth daily.       Allergies  Allergen Reactions  . Betadine [Povidone Iodine] Other (See Comments)    Reaction:  Blisters   . Gadavist [Gadobutrol]     hives  . Lortab [Hydrocodone-Acetaminophen] Nausea And Vomiting  . Penicillins Other (See Comments)    Reaction:  Lightheadedness  Has patient had a PCN reaction causing immediate rash, facial/tongue/throat swelling, SOB or lightheadedness with hypotension: Yes Has patient had a PCN  reaction causing severe rash involving mucus membranes or skin necrosis: No Has patient had a PCN reaction that required hospitalization No Has patient had a PCN reaction occurring within the last 10 years: No If all of the above answers are "NO", then may proceed with Cephalosporin use.  Marland Kitchen Zetia [Ezetimibe] Other (See Comments)    Reaction:  Muscle weakness   . Zocor [Simvastatin] Nausea Only and Other (See Comments)    Reaction:  Muscle weakness     Consultations:  Bibasilar rales   Procedures/Studies: DG Chest 2 View  Result Date: 06/08/2020 CLINICAL DATA:  Hemoptysis EXAM: CHEST - 2 VIEW COMPARISON:  05/11/2020 FINDINGS: The heart size and mediastinal contours are within normal limits. No acute airspace opacity. A previously noted rounded density of the infrahilar right lung is not well appreciated on current examination. Redemonstrated focal eventration of the left hemidiaphragm. The visualized skeletal structures are unremarkable. IMPRESSION: 1.  No acute abnormality of the lungs. 2. A previously noted rounded density of the infrahilar right lung is not well appreciated on current examination. CT remains as previously recommended to exclude malignancy. Electronically Signed   By: Eddie Candle M.D.   On: 06/08/2020 16:21   DG Chest 2 View  Result Date: 05/12/2020 CLINICAL DATA:  Pneumonia. EXAM: CHEST - 2 VIEW COMPARISON:  November 27, 2017. FINDINGS: The heart size and mediastinal contours are within normal limits. No pneumothorax or pleural effusion is noted. Eventration is seen involving the posterior portion of the left hemidiaphragm. Old left rib fractures are noted. Left lung is clear. Rounded density is seen in right lower lobe concerning for possible neoplasm. IMPRESSION: Rounded density seen in right lower lobe concerning for possible neoplasm. CT scan of the chest with intravenous contrast is recommended for further evaluation. These results will be called to the ordering  clinician or representative by the Radiologist Assistant, and communication documented in the PACS or zVision Dashboard. Electronically Signed   By: Marijo Conception M.D.   On: 05/12/2020 10:44   CT Chest W Contrast  Result Date: 06/08/2020 CLINICAL DATA:  Hemoptysis since last night. EXAM: CT CHEST WITH CONTRAST TECHNIQUE: Multidetector CT imaging of the chest was performed during intravenous contrast administration. CONTRAST:  75mL OMNIPAQUE IOHEXOL 300 MG/ML  SOLN COMPARISON:  CT chest dated December 26, 2018. FINDINGS: Cardiovascular: Normal heart size. No pericardial effusion. Unchanged ascending thoracic aortic aneurysm measuring up to 4.4 cm. No dissection. Coronary, aortic arch, and branch vessel atherosclerotic vascular disease. Mediastinum/Nodes: No enlarged mediastinal, hilar, or axillary lymph nodes. Thyroid gland, trachea, and esophagus demonstrate no significant findings. Lungs/Pleura: Unchanged chronic debris and mucoid impaction in both lower lobes, worse on the right. Similar appearing scarring in both lungs, worse in the right lower lobe. No focal consolidation, pleural effusion, or pneumothorax. Bilateral pleural calcifications  again noted. Upper Abdomen: No acute abnormality. Postsurgical changes at the GE junction with unchanged small hiatal hernia. Presumably mixing artifact in the portal vein. Musculoskeletal: No chest wall abnormality. No acute or significant osseous findings. Multiple chronic thoracic compression deformities and old left rib fractures again noted. IMPRESSION: 1. No acute intrathoracic process. 2. Unchanged chronic debris and mucoid impaction in both lower lobes, worse on the right. 3. Unchanged 4.4 cm ascending thoracic aortic aneurysm. Recommend annual imaging followup by CTA or MRA. This recommendation follows 2010 ACCF/AHA/AATS/ACR/ASA/SCA/SCAI/SIR/STS/SVM Guidelines for the Diagnosis and Management of Patients with Thoracic Aortic Disease. Circulation. 2010; 121:  X448-J856. Aortic aneurysm NOS (ICD10-I71.9) 4. Aortic Atherosclerosis (ICD10-I70.0). Electronically Signed   By: Titus Dubin M.D.   On: 06/08/2020 18:17   CT ANGIO CHEST PE W OR WO CONTRAST  Result Date: 06/09/2020 CLINICAL DATA:  Elevated D-dimer. Tachycardia, cough, hemoptysis, shortness of breath EXAM: CT ANGIOGRAPHY CHEST WITH CONTRAST TECHNIQUE: Multidetector CT imaging of the chest was performed using the standard protocol during bolus administration of intravenous contrast. Multiplanar CT image reconstructions and MIPs were obtained to evaluate the vascular anatomy. CONTRAST:  36mL OMNIPAQUE IOHEXOL 350 MG/ML SOLN COMPARISON:  06/08/2020 FINDINGS: Cardiovascular: No filling defects in the pulmonary arteries to suggest pulmonary emboli. 4.3 cm ascending thoracic aortic aneurysm. Aortic calcifications in the arch and descending thoracic aorta. Extensive coronary artery calcifications most pronounced in the left coronary arteries. Mediastinum/Nodes: No mediastinal, hilar, or axillary adenopathy. Trachea and esophagus are unremarkable. Thyroid unremarkable. Lungs/Pleura: Chronic debris/mucoid impaction in the lower lobe bronchi, right greater than left. Stable chronic elevation of the left hemidiaphragm. Bibasilar atelectasis. No effusions. Calcified pleural plaques bilaterally. Upper Abdomen: Imaging into the upper abdomen demonstrates no acute findings. Musculoskeletal: Chest wall soft tissues are unremarkable. No acute bony abnormality. Review of the MIP images confirms the above findings. IMPRESSION: No evidence of pulmonary embolus. Coronary artery disease. Chronic mucoid impaction in the lower lobe bronchi with bibasilar atelectasis. Stable chronic elevation of the left hemidiaphragm. 4.3 cm ascending thoracic aortic aneurysm. Recommend annual imaging followup by CTA or MRA. This recommendation follows 2010 ACCF/AHA/AATS/ACR/ASA/SCA/SCAI/SIR/STS/SVM Guidelines for the Diagnosis and Management of  Patients with Thoracic Aortic Disease. Circulation. 2010; 121: D149-F026. Aortic aneurysm NOS (ICD10-I71.9) Aortic Atherosclerosis (ICD10-I70.0). Electronically Signed   By: Rolm Baptise M.D.   On: 06/09/2020 11:43        Discharge Exam: Vitals:   06/09/20 1009 06/09/20 1351  BP:  (!) 113/55  Pulse:  75  Resp:  16  Temp: 98 F (36.7 C) 98 F (36.7 C)  SpO2:  99%   Vitals:   06/09/20 0752 06/09/20 1003 06/09/20 1009 06/09/20 1351  BP:  (!) 103/58  (!) 113/55  Pulse:  79  75  Resp:    16  Temp:   98 F (36.7 C) 98 F (36.7 C)  TempSrc:   Oral Oral  SpO2: 96% 98%  99%  Weight:      Height:        General: Pt is alert, awake, not in acute distress Cardiovascular: RRR, S1/S2 +, no rubs, no gallops Respiratory: bibasilar rales. No wheeze Abdominal: Soft, NT, ND, bowel sounds + Extremities: no edema, no cyanosis   The results of significant diagnostics from this hospitalization (including imaging, microbiology, ancillary and laboratory) are listed below for reference.    Significant Diagnostic Studies: DG Chest 2 View  Result Date: 06/08/2020 CLINICAL DATA:  Hemoptysis EXAM: CHEST - 2 VIEW COMPARISON:  05/11/2020 FINDINGS: The heart size and  mediastinal contours are within normal limits. No acute airspace opacity. A previously noted rounded density of the infrahilar right lung is not well appreciated on current examination. Redemonstrated focal eventration of the left hemidiaphragm. The visualized skeletal structures are unremarkable. IMPRESSION: 1.  No acute abnormality of the lungs. 2. A previously noted rounded density of the infrahilar right lung is not well appreciated on current examination. CT remains as previously recommended to exclude malignancy. Electronically Signed   By: Eddie Candle M.D.   On: 06/08/2020 16:21   DG Chest 2 View  Result Date: 05/12/2020 CLINICAL DATA:  Pneumonia. EXAM: CHEST - 2 VIEW COMPARISON:  November 27, 2017. FINDINGS: The heart size and  mediastinal contours are within normal limits. No pneumothorax or pleural effusion is noted. Eventration is seen involving the posterior portion of the left hemidiaphragm. Old left rib fractures are noted. Left lung is clear. Rounded density is seen in right lower lobe concerning for possible neoplasm. IMPRESSION: Rounded density seen in right lower lobe concerning for possible neoplasm. CT scan of the chest with intravenous contrast is recommended for further evaluation. These results will be called to the ordering clinician or representative by the Radiologist Assistant, and communication documented in the PACS or zVision Dashboard. Electronically Signed   By: Marijo Conception M.D.   On: 05/12/2020 10:44   CT Chest W Contrast  Result Date: 06/08/2020 CLINICAL DATA:  Hemoptysis since last night. EXAM: CT CHEST WITH CONTRAST TECHNIQUE: Multidetector CT imaging of the chest was performed during intravenous contrast administration. CONTRAST:  55mL OMNIPAQUE IOHEXOL 300 MG/ML  SOLN COMPARISON:  CT chest dated December 26, 2018. FINDINGS: Cardiovascular: Normal heart size. No pericardial effusion. Unchanged ascending thoracic aortic aneurysm measuring up to 4.4 cm. No dissection. Coronary, aortic arch, and branch vessel atherosclerotic vascular disease. Mediastinum/Nodes: No enlarged mediastinal, hilar, or axillary lymph nodes. Thyroid gland, trachea, and esophagus demonstrate no significant findings. Lungs/Pleura: Unchanged chronic debris and mucoid impaction in both lower lobes, worse on the right. Similar appearing scarring in both lungs, worse in the right lower lobe. No focal consolidation, pleural effusion, or pneumothorax. Bilateral pleural calcifications again noted. Upper Abdomen: No acute abnormality. Postsurgical changes at the GE junction with unchanged small hiatal hernia. Presumably mixing artifact in the portal vein. Musculoskeletal: No chest wall abnormality. No acute or significant osseous findings.  Multiple chronic thoracic compression deformities and old left rib fractures again noted. IMPRESSION: 1. No acute intrathoracic process. 2. Unchanged chronic debris and mucoid impaction in both lower lobes, worse on the right. 3. Unchanged 4.4 cm ascending thoracic aortic aneurysm. Recommend annual imaging followup by CTA or MRA. This recommendation follows 2010 ACCF/AHA/AATS/ACR/ASA/SCA/SCAI/SIR/STS/SVM Guidelines for the Diagnosis and Management of Patients with Thoracic Aortic Disease. Circulation. 2010; 121: H086-V784. Aortic aneurysm NOS (ICD10-I71.9) 4. Aortic Atherosclerosis (ICD10-I70.0). Electronically Signed   By: Titus Dubin M.D.   On: 06/08/2020 18:17   CT ANGIO CHEST PE W OR WO CONTRAST  Result Date: 06/09/2020 CLINICAL DATA:  Elevated D-dimer. Tachycardia, cough, hemoptysis, shortness of breath EXAM: CT ANGIOGRAPHY CHEST WITH CONTRAST TECHNIQUE: Multidetector CT imaging of the chest was performed using the standard protocol during bolus administration of intravenous contrast. Multiplanar CT image reconstructions and MIPs were obtained to evaluate the vascular anatomy. CONTRAST:  14mL OMNIPAQUE IOHEXOL 350 MG/ML SOLN COMPARISON:  06/08/2020 FINDINGS: Cardiovascular: No filling defects in the pulmonary arteries to suggest pulmonary emboli. 4.3 cm ascending thoracic aortic aneurysm. Aortic calcifications in the arch and descending thoracic aorta. Extensive coronary artery calcifications  most pronounced in the left coronary arteries. Mediastinum/Nodes: No mediastinal, hilar, or axillary adenopathy. Trachea and esophagus are unremarkable. Thyroid unremarkable. Lungs/Pleura: Chronic debris/mucoid impaction in the lower lobe bronchi, right greater than left. Stable chronic elevation of the left hemidiaphragm. Bibasilar atelectasis. No effusions. Calcified pleural plaques bilaterally. Upper Abdomen: Imaging into the upper abdomen demonstrates no acute findings. Musculoskeletal: Chest wall soft tissues  are unremarkable. No acute bony abnormality. Review of the MIP images confirms the above findings. IMPRESSION: No evidence of pulmonary embolus. Coronary artery disease. Chronic mucoid impaction in the lower lobe bronchi with bibasilar atelectasis. Stable chronic elevation of the left hemidiaphragm. 4.3 cm ascending thoracic aortic aneurysm. Recommend annual imaging followup by CTA or MRA. This recommendation follows 2010 ACCF/AHA/AATS/ACR/ASA/SCA/SCAI/SIR/STS/SVM Guidelines for the Diagnosis and Management of Patients with Thoracic Aortic Disease. Circulation. 2010; 121: Y301-S010. Aortic aneurysm NOS (ICD10-I71.9) Aortic Atherosclerosis (ICD10-I70.0). Electronically Signed   By: Rolm Baptise M.D.   On: 06/09/2020 11:43     Microbiology: Recent Results (from the past 240 hour(s))  Resp Panel by RT-PCR (Flu A&B, Covid) Nasopharyngeal Swab     Status: None   Collection Time: 06/08/20  8:47 PM   Specimen: Nasopharyngeal Swab; Nasopharyngeal(NP) swabs in vial transport medium  Result Value Ref Range Status   SARS Coronavirus 2 by RT PCR NEGATIVE NEGATIVE Final    Comment: (NOTE) SARS-CoV-2 target nucleic acids are NOT DETECTED.  The SARS-CoV-2 RNA is generally detectable in upper respiratory specimens during the acute phase of infection. The lowest concentration of SARS-CoV-2 viral copies this assay can detect is 138 copies/mL. A negative result does not preclude SARS-Cov-2 infection and should not be used as the sole basis for treatment or other patient management decisions. A negative result may occur with  improper specimen collection/handling, submission of specimen other than nasopharyngeal swab, presence of viral mutation(s) within the areas targeted by this assay, and inadequate number of viral copies(<138 copies/mL). A negative result must be combined with clinical observations, patient history, and epidemiological information. The expected result is Negative.  Fact Sheet for  Patients:  EntrepreneurPulse.com.au  Fact Sheet for Healthcare Providers:  IncredibleEmployment.be  This test is no t yet approved or cleared by the [place] FDA and  has been authorized for detection and/or diagnosis of SARS-CoV-2 by FDA under an Emergency Use Authorization (EUA). This EUA will remain  in effect (meaning this test can be used) for the duration of the COVID-19 declaration under Section 564(b)(1) of the Act, 21 U.S.C.section 360bbb-3(b)(1), unless the authorization is terminated  or revoked sooner.       Influenza A by PCR NEGATIVE NEGATIVE Final   Influenza B by PCR NEGATIVE NEGATIVE Final    Comment: (NOTE) The Xpert Xpress SARS-CoV-2/FLU/RSV plus assay is intended as an aid in the diagnosis of influenza from Nasopharyngeal swab specimens and should not be used as a sole basis for treatment. Nasal washings and aspirates are unacceptable for Xpert Xpress SARS-CoV-2/FLU/RSV testing.  Fact Sheet for Patients: EntrepreneurPulse.com.au  Fact Sheet for Healthcare Providers: IncredibleEmployment.be  This test is not yet approved or cleared by the [place] FDA and has been authorized for detection and/or diagnosis of SARS-CoV-2 by FDA under an Emergency Use Authorization (EUA). This EUA will remain in effect (meaning this test can be used) for the duration of the COVID-19 declaration under Section 564(b)(1) of the Act, 21 U.S.C. section 360bbb-3(b)(1), unless the authorization is terminated or revoked.  Performed at [facility], [address], [place], [place] [address]  Labs: Basic Metabolic Panel: Recent Labs  Lab 06/08/20 1359 06/09/20 0510  NA 138 141  K 4.2 4.3  CL 107 110  CO2 24 24  GLUCOSE 82 86  BUN 44* 44*  CREATININE 0.89 0.88  CALCIUM 8.9 8.0*  MG  --  1.8  PHOS  --  2.8   Liver Function Tests: Recent Labs  Lab 06/09/20 0510  AST 18  ALT 16   ALKPHOS 41  BILITOT 0.3  PROT 5.0*  ALBUMIN 2.8*   No results for input(s): LIPASE, AMYLASE in the last 168 hours. No results for input(s): AMMONIA in the last 168 hours. CBC: Recent Labs  Lab 06/08/20 1359 06/09/20 0823  WBC 10.0 9.9  NEUTROABS 7.2  --   HGB 10.8* 8.6*  HCT 33.6* 26.3*  MCV 101.2* 100.0  PLT 225 199   Cardiac Enzymes: No results for input(s): CKTOTAL, CKMB, CKMBINDEX, TROPONINI in the last 168 hours. BNP: Invalid input(s): POCBNP CBG: No results for input(s): GLUCAP in the last 168 hours.  Time coordinating discharge:  36 minutes  Signed:  Orson Eva, DO [facility] Pager: [phone] 06/09/2020, 2:08 PM

## 2020-06-10 ENCOUNTER — Ambulatory Visit: Payer: Medicare Other | Admitting: Nurse Practitioner

## 2020-06-10 ENCOUNTER — Other Ambulatory Visit: Payer: Self-pay

## 2020-06-10 ENCOUNTER — Other Ambulatory Visit (INDEPENDENT_AMBULATORY_CARE_PROVIDER_SITE_OTHER): Payer: Medicare Other

## 2020-06-10 ENCOUNTER — Telehealth: Payer: Self-pay

## 2020-06-10 ENCOUNTER — Encounter: Payer: Self-pay | Admitting: Nurse Practitioner

## 2020-06-10 VITALS — BP 104/62 | HR 97 | Ht 64.5 in | Wt 166.1 lb

## 2020-06-10 DIAGNOSIS — K2100 Gastro-esophageal reflux disease with esophagitis, without bleeding: Secondary | ICD-10-CM

## 2020-06-10 DIAGNOSIS — D649 Anemia, unspecified: Secondary | ICD-10-CM

## 2020-06-10 DIAGNOSIS — R042 Hemoptysis: Secondary | ICD-10-CM

## 2020-06-10 LAB — BASIC METABOLIC PANEL
BUN: 24 mg/dL — ABNORMAL HIGH (ref 6–23)
CO2: 25 mEq/L (ref 19–32)
Calcium: 9 mg/dL (ref 8.4–10.5)
Chloride: 107 mEq/L (ref 96–112)
Creatinine, Ser: 1.03 mg/dL (ref 0.40–1.50)
GFR: 66.12 mL/min (ref >60.00)
Glucose, Bld: 97 mg/dL (ref 70–99)
Potassium: 4.1 mEq/L (ref 3.5–5.1)
Sodium: 138 mEq/L (ref 135–145)

## 2020-06-10 LAB — HEMATOCRIT: HCT: 25.3 % — ABNORMAL LOW (ref 39.0–52.0)

## 2020-06-10 LAB — HEMOGLOBIN: Hemoglobin: 8.6 g/dL — ABNORMAL LOW (ref 13.0–17.0)

## 2020-06-10 MED ORDER — PANTOPRAZOLE SODIUM 40 MG PO TBEC: 40.0000 mg | DELAYED_RELEASE_TABLET | Freq: Two times a day (BID) | ORAL | 0 refills | Status: DC

## 2020-06-10 NOTE — Telephone Encounter (Signed)
Okay thanks for the [place], I did see that he went to the emergency department I am glad he did.

## 2020-06-10 NOTE — Telephone Encounter (Signed)
Pt returning call

## 2020-06-10 NOTE — Telephone Encounter (Signed)
Transition Care Management Follow-up Telephone Call  Date of discharge and from where: 06/09/20 [facility]  How have you been since you were released from the hospital? Weak and tired   Any questions or concerns? No  Items Reviewed:  Did the pt receive and understand the discharge instructions provided? Yes   Medications obtained and verified? Yes   Other? No   Any new allergies since your discharge? No   Dietary orders reviewed? Yes  Do you have support at home? Yes   Home Care and Equipment/Supplies: Were home health services ordered? no Were any new equipment or medical supplies ordered?  No   Functional Questionnaire: (I = Independent and D = Dependent) ADLs: I  Bathing/Dressing- I  Meal Prep- I  Eating- I  Maintaining continence- I  Transferring/Ambulation- I  Managing Meds- I  Follow up appointments reviewed:   PCP Hospital f/u appt confirmed? Yes  Scheduled to see Britney on 4/14 @ 9.  [facility] f/u appt confirmed? Yes  Scheduled to see Loanne Drilling on 4/20 @ 11:15.  Are transportation arrangements needed? No   If their condition worsens, is the pt aware to call PCP or go to the Emergency Dept.? Yes  Was the patient provided with contact information for the PCP's office or ED? Yes  Was to pt encouraged to call back with questions or concerns? Yes

## 2020-06-10 NOTE — Patient Instructions (Addendum)
If you are age 85 or older, your body mass index should be between 23-30. Your Body mass index is 28.07 kg/m. If this is out of the aforementioned range listed, please consider follow up with your Primary Care Provider.  PROCEDURES: You have been scheduled for a Endoscopy. Please follow the written instructions given to you at your visit today. If you use inhalers (even only as needed), please bring them with you on the day of your procedure.  We will need pulmonary clearance prior to the procedure.  LABS:  Lab work has been ordered for you today. Our lab is located in the basement. Press "B" on the elevator. The lab is located at the first door on the left as you exit the elevator.  HEALTHCARE LAWS AND MY CHART RESULTS: Due to recent changes in healthcare laws, you may see the results of your imaging and laboratory studies on MyChart before your provider has had a chance to review them.   We understand that in some cases there may be results that are confusing or concerning to you. Not all laboratory results come back in the same time frame and the provider may be waiting for multiple results in order to interpret others.  Please give [place] 48 hours in order for your provider to thoroughly review all the results before contacting the office for clarification of your results.   MEDICATION: We have sent the following medication to your pharmacy for you to pick up at your convenience: Pantoprazole 40 MG twice a day.  Please go to the Emergency Room if you develop chest pain or worsening shortness of breath.  It was great seeing you today! Thank you for entrusting me with your care and choosing [facility].  Noralyn Pick, CRNP

## 2020-06-10 NOTE — Telephone Encounter (Signed)
Transition Care Management Unsuccessful Follow-up Telephone Call  Date of discharge and from where:  [name]  Attempts:  1st Attempt  Reason for unsuccessful TCM follow-up call:  Left voice message on both numbers

## 2020-06-11 ENCOUNTER — Encounter: Payer: Self-pay | Admitting: Family Medicine

## 2020-06-11 ENCOUNTER — Emergency Department (HOSPITAL_COMMUNITY): Payer: Medicare Other

## 2020-06-11 ENCOUNTER — Inpatient Hospital Stay (HOSPITAL_COMMUNITY)
Admission: EM | Admit: 2020-06-11 | Discharge: 2020-06-13 | DRG: 368 | Disposition: A | Discharge status: Home / Self Care | Payer: Medicare Other | Attending: Family Medicine | Admitting: Family Medicine

## 2020-06-11 ENCOUNTER — Other Ambulatory Visit: Payer: Self-pay

## 2020-06-11 ENCOUNTER — Telehealth: Payer: Self-pay

## 2020-06-11 ENCOUNTER — Ambulatory Visit: Payer: Medicare Other | Admitting: Family Medicine

## 2020-06-11 ENCOUNTER — Encounter (HOSPITAL_COMMUNITY): Payer: Self-pay

## 2020-06-11 ENCOUNTER — Ambulatory Visit (INDEPENDENT_AMBULATORY_CARE_PROVIDER_SITE_OTHER): Payer: Medicare Other | Admitting: Family Medicine

## 2020-06-11 VITALS — BP 104/64 | HR 83 | Temp 97.3°F | Ht 64.5 in | Wt 167.2 lb

## 2020-06-11 DIAGNOSIS — E039 Hypothyroidism, unspecified: Secondary | ICD-10-CM | POA: Diagnosis present

## 2020-06-11 DIAGNOSIS — Z743 Need for continuous supervision: Secondary | ICD-10-CM | POA: Diagnosis not present

## 2020-06-11 DIAGNOSIS — R195 Other fecal abnormalities: Secondary | ICD-10-CM | POA: Diagnosis not present

## 2020-06-11 DIAGNOSIS — D649 Anemia, unspecified: Secondary | ICD-10-CM | POA: Diagnosis present

## 2020-06-11 DIAGNOSIS — K2101 Gastro-esophageal reflux disease with esophagitis, with bleeding: Principal | ICD-10-CM | POA: Diagnosis present

## 2020-06-11 DIAGNOSIS — R042 Hemoptysis: Secondary | ICD-10-CM

## 2020-06-11 DIAGNOSIS — I712 Thoracic aortic aneurysm, without rupture: Secondary | ICD-10-CM | POA: Diagnosis present

## 2020-06-11 DIAGNOSIS — K219 Gastro-esophageal reflux disease without esophagitis: Secondary | ICD-10-CM | POA: Diagnosis present

## 2020-06-11 DIAGNOSIS — R079 Chest pain, unspecified: Secondary | ICD-10-CM

## 2020-06-11 DIAGNOSIS — Z888 Allergy status to other drugs, medicaments and biological substances status: Secondary | ICD-10-CM

## 2020-06-11 DIAGNOSIS — R059 Cough, unspecified: Secondary | ICD-10-CM | POA: Diagnosis not present

## 2020-06-11 DIAGNOSIS — G629 Polyneuropathy, unspecified: Secondary | ICD-10-CM | POA: Diagnosis present

## 2020-06-11 DIAGNOSIS — Q438 Other specified congenital malformations of intestine: Secondary | ICD-10-CM

## 2020-06-11 DIAGNOSIS — K449 Diaphragmatic hernia without obstruction or gangrene: Secondary | ICD-10-CM | POA: Diagnosis present

## 2020-06-11 DIAGNOSIS — I2510 Atherosclerotic heart disease of native coronary artery without angina pectoris: Secondary | ICD-10-CM | POA: Diagnosis present

## 2020-06-11 DIAGNOSIS — Z955 Presence of coronary angioplasty implant and graft: Secondary | ICD-10-CM

## 2020-06-11 DIAGNOSIS — K922 Gastrointestinal hemorrhage, unspecified: Secondary | ICD-10-CM | POA: Diagnosis present

## 2020-06-11 DIAGNOSIS — Z20822 Contact with and (suspected) exposure to covid-19: Secondary | ICD-10-CM | POA: Diagnosis present

## 2020-06-11 DIAGNOSIS — K5730 Diverticulosis of large intestine without perforation or abscess without bleeding: Secondary | ICD-10-CM | POA: Diagnosis present

## 2020-06-11 DIAGNOSIS — J440 Chronic obstructive pulmonary disease with acute lower respiratory infection: Secondary | ICD-10-CM | POA: Diagnosis present

## 2020-06-11 DIAGNOSIS — I700 Atherosclerosis of aorta: Secondary | ICD-10-CM | POA: Diagnosis not present

## 2020-06-11 DIAGNOSIS — Z79899 Other long term (current) drug therapy: Secondary | ICD-10-CM

## 2020-06-11 DIAGNOSIS — Z96643 Presence of artificial hip joint, bilateral: Secondary | ICD-10-CM | POA: Diagnosis present

## 2020-06-11 DIAGNOSIS — M810 Age-related osteoporosis without current pathological fracture: Secondary | ICD-10-CM | POA: Diagnosis present

## 2020-06-11 DIAGNOSIS — Z85820 Personal history of malignant melanoma of skin: Secondary | ICD-10-CM

## 2020-06-11 DIAGNOSIS — Z79890 Hormone replacement therapy (postmenopausal): Secondary | ICD-10-CM

## 2020-06-11 DIAGNOSIS — Z7982 Long term (current) use of aspirin: Secondary | ICD-10-CM

## 2020-06-11 DIAGNOSIS — D509 Iron deficiency anemia, unspecified: Secondary | ICD-10-CM | POA: Diagnosis present

## 2020-06-11 DIAGNOSIS — F32A Depression, unspecified: Secondary | ICD-10-CM | POA: Diagnosis present

## 2020-06-11 DIAGNOSIS — D126 Benign neoplasm of colon, unspecified: Secondary | ICD-10-CM | POA: Diagnosis present

## 2020-06-11 DIAGNOSIS — Z7951 Long term (current) use of inhaled steroids: Secondary | ICD-10-CM

## 2020-06-11 DIAGNOSIS — N400 Enlarged prostate without lower urinary tract symptoms: Secondary | ICD-10-CM | POA: Diagnosis present

## 2020-06-11 DIAGNOSIS — K635 Polyp of colon: Secondary | ICD-10-CM | POA: Diagnosis present

## 2020-06-11 DIAGNOSIS — R0789 Other chest pain: Secondary | ICD-10-CM | POA: Diagnosis not present

## 2020-06-11 DIAGNOSIS — J189 Pneumonia, unspecified organism: Secondary | ICD-10-CM | POA: Diagnosis present

## 2020-06-11 DIAGNOSIS — Z8711 Personal history of peptic ulcer disease: Secondary | ICD-10-CM

## 2020-06-11 DIAGNOSIS — Z880 Allergy status to penicillin: Secondary | ICD-10-CM

## 2020-06-11 DIAGNOSIS — Z8249 Family history of ischemic heart disease and other diseases of the circulatory system: Secondary | ICD-10-CM

## 2020-06-11 DIAGNOSIS — R0602 Shortness of breath: Secondary | ICD-10-CM | POA: Diagnosis not present

## 2020-06-11 DIAGNOSIS — I4891 Unspecified atrial fibrillation: Secondary | ICD-10-CM | POA: Diagnosis present

## 2020-06-11 LAB — CBC WITH DIFFERENTIAL/PLATELET
Abs Immature Granulocytes: 0.53 10*3/uL — ABNORMAL HIGH (ref 0.00–0.07)
Basophils Absolute: 0.1 10*3/uL (ref 0.0–0.1)
Basophils Relative: 1 %
Eosinophils Absolute: 0.2 10*3/uL (ref 0.0–0.5)
Eosinophils Relative: 2 %
HCT: 23.2 % — ABNORMAL LOW (ref 39.0–52.0)
Hemoglobin: 7.6 g/dL — ABNORMAL LOW (ref 13.0–17.0)
Immature Granulocytes: 3 %
Lymphocytes Relative: 5 %
Lymphs Abs: 0.8 10*3/uL (ref 0.7–4.0)
MCH: 33.2 pg (ref 26.0–34.0)
MCHC: 32.8 g/dL (ref 30.0–36.0)
MCV: 101.3 fL — ABNORMAL HIGH (ref 80.0–100.0)
Monocytes Absolute: 2 10*3/uL — ABNORMAL HIGH (ref 0.1–1.0)
Monocytes Relative: 13 %
Neutro Abs: 12 10*3/uL — ABNORMAL HIGH (ref 1.7–7.7)
Neutrophils Relative %: 76 %
Platelets: 195 10*3/uL (ref 150–400)
RBC: 2.29 MIL/uL — ABNORMAL LOW (ref 4.22–5.81)
RDW: 15.4 % (ref 11.5–15.5)
WBC: 15.7 10*3/uL — ABNORMAL HIGH (ref 4.0–10.5)
nRBC: 0 % (ref 0.0–0.2)

## 2020-06-11 LAB — BASIC METABOLIC PANEL
Anion gap: 8 (ref 5–15)
BUN: 23 mg/dL (ref 8–23)
CO2: 21 mmol/L — ABNORMAL LOW (ref 22–32)
Calcium: 8.3 mg/dL — ABNORMAL LOW (ref 8.9–10.3)
Chloride: 106 mmol/L (ref 98–111)
Creatinine, Ser: 0.99 mg/dL (ref 0.61–1.24)
GFR, Estimated: >60 mL/min (ref >60)
Glucose, Bld: 101 mg/dL — ABNORMAL HIGH (ref 70–99)
Potassium: 3.8 mmol/L (ref 3.5–5.1)
Sodium: 135 mmol/L (ref 135–145)

## 2020-06-11 LAB — TROPONIN I (HIGH SENSITIVITY): Troponin I (High Sensitivity): 8 ng/L (ref <18)

## 2020-06-11 LAB — IRON,TIBC AND FERRITIN PANEL
%SAT: 12 % (calc) — ABNORMAL LOW (ref 20–48)
Ferritin: 59 ng/mL (ref 24–380)
Iron: 35 ug/dL — ABNORMAL LOW (ref 50–180)
TIBC: 296 mcg/dL (calc) (ref 250–425)

## 2020-06-11 LAB — PREPARE RBC (CROSSMATCH)

## 2020-06-11 LAB — HEMOGLOBIN, FINGERSTICK: Hemoglobin: 7.9 g/dL — ABNORMAL LOW (ref 12.6–17.7)

## 2020-06-11 MED ORDER — SODIUM CHLORIDE 0.9% IV SOLUTION: Freq: Once | INTRAVENOUS | Status: DC

## 2020-06-11 NOTE — Telephone Encounter (Signed)
Returned patients call- patient wanted to know what his hemoglobin level today. Level given

## 2020-06-11 NOTE — ED Triage Notes (Signed)
REMS brought pt in for intermittent chest pain and sob that has been intermittent throughout today. Pt saw PCP and reports Hbg was 7.9 and they made appt for pt to come back Monday. Pt says he wanted to come here to get checked out. Pt denies chest pain at this time.

## 2020-06-11 NOTE — ED Provider Notes (Signed)
Is an [facility] Provider Note   CSN: [id number] Arrival date & time: 06/11/20  2036     History Chief Complaint  Patient presents with  . Chest Pain    [name] is a 87 y.o. male.  HPI   45-year-old male, history of a thoracic aortic aneurysm, no repair, history of coronary disease status post stenting in 2008, history of hypothyroidism, COPD and recent multiple visits to the emergency department and to his family doctor's pulmonologist and gastroenterologist for a variety of complaints that essentially boiled down to some hemoptysis and a progressive mild anemia.  The patient reports that he was in the emergency department several days ago, I reviewed these results and in fact he did have a CT angiogram of the chest which showed no pulmonary embolism.  He does have evidence of COPD.  His hemoglobin has been slowly dropping, a CBC done 3 days ago showed a hemoglobin of 10.8, it showed 8.62 days ago, yesterday it was 8.6 as well and he was told that in the office today it had dropped down to 7.9.  He was informed that if he had ongoing shortness of breath chest pain syncope or palpitations he was returned to the emergency department which he did tonight.  The patient actually reports to me that he has been having some difficulty with chest pain and shortness of breath today while he was trying to feed the birds and empty some bird feed into a bird feeder.  He states that this is worse with exertion better with rest and he is totally symptom-free right now.  He has been referred to gastroenterology and has a June scheduled upper endoscopy (history of gastric ulcer in the past but not on the most recent endoscopy).    He denies chest pain shortness of breath abdominal pain headache swelling of the legs nausea vomiting or diarrhea at this time  Past Medical History:  Diagnosis Date  . Abnormality of gait 06/07/2013  . Anemia   . Aneurysm ([place])    on assending  aorta, currently watching it, Dr Cyndia Bent  . Anxiety   . Arthritis   . Asthma   . Atrial fibrillation ([place])   . Barrett's esophagus   . CAD (coronary artery disease) 03/2006   3.0 x 20 mm TAXUS Perseus DES to the LAD; 01/2007  L main 30%, oLAD 50%, pLAD stent ok, CFX 80%, OM 60%, pRCA 60%, mRCA 70%, oPDA 90%; med rx   . Cancer ([place])    skin cancer   . Cataract    bilateral removal of cateracts  . Complication of anesthesia     no issues,but pt prefers spinal due to Pulmonary problems  . COPD (chronic obstructive pulmonary disease) ([place])    oxygen  on standby in home.  . Depression   . Diverticulosis   . Emphysema   . Enlarged prostate with urinary retention   . Esophageal stenosis   . GERD (gastroesophageal reflux disease)   . Hiatal hernia   . Hypothyroidism   . Neuropathy   . Osteoporosis   . Pituitary macroadenoma ([place])   . Restless legs syndrome (RLS)   . UGIB (upper gastrointestinal bleed) 03/2013   EGD w/ large ulcer at GE junction    Patient Active Problem List   Diagnosis Date Noted  . Symptomatic anemia 06/11/2020  . Barrett's esophagus 06/09/2020  . Hemoptysis 06/08/2020  . Acquired dilation of ascending aorta and aortic root ([place]) 05/20/2020  .  Chest congestion 05/11/2020  . Metatarsalgia of right foot 08/27/2019  . Memory disorder 05/03/2019  . Chronic constipation 10/17/2018  . Peripheral vascular insufficiency ([place]) 07/19/2017  . Aneurysmal dilatation ([place]) 07/19/2017  . Aortic atherosclerosis ([place]) 11/21/2016  . Thoracic ascending aortic aneurysm ([place]) 08/10/2016  . CAD (coronary artery disease) 08/10/2016  . Hiatal hernia 05/04/2016  . Esophageal stricture   . Afib ([place]) 06/12/2015  . Postoperative anemia due to acute blood loss 06/11/2015  . Neuropathy 12/31/2013  . Vitamin B 12 deficiency 12/31/2013  . History of esophageal ulcer 04/20/2013  . S/P left and ritght THA, AA 07/21/2011  . Special screening for malignant neoplasms, colon 01/03/2011   . Personal history of colonic polyps 01/03/2011  . COPD (chronic obstructive pulmonary disease) ([place]) 08/24/2010  . COLONIC POLYPS 07/17/2008  . Macrocytic anemia 07/17/2008  . Gastroesophageal reflux disease with esophagitis 07/17/2008  . DEGENERATIVE JOINT DISEASE 07/17/2008  . Osteoporosis 07/17/2008  . BPH (benign prostatic hyperplasia) 07/17/2008  . Hyperprolactinemia ([place]) 10/24/2007  . PITUITARY ADENOMA 06/25/2007  . Hypothyroidism 06/25/2007  . Hyperlipidemia LDL goal <70 06/22/2007  . Coronary atherosclerosis 06/22/2007    Past Surgical History:  Procedure Laterality Date  . ABDOMINAL HERNIA REPAIR   2008  . CARDIAC CATHETERIZATION  01/2007   L main 30%, oLAD 50%,  pLAD stent ok, CFX 80%, OM 60%, pRCA 60%, mRCA 70%, oPDA 90%; med rx  . cataract extraction both eyes    . COLONOSCOPY    . CORONARY STENT PLACEMENT  03/2006   3.0 x 20 mm TAXUS Perseus DES to the LAD  . CYSTOSCOPY N/A 06/10/2015   Procedure: CYSTOSCOPY FULGRATION OF BLEEDING,  electovapor resection;  Surgeon: Irine Seal, MD;  Location: [facility];  Service: Urology;  Laterality: N/A;  . CYSTOSCOPY WITH INSERTION OF UROLIFT N/A 06/01/2015   Procedure: CYSTOSCOPY WITH INSERTION OF UROLIFT x4;  Surgeon: Irine Seal, MD;  Location: [facility];  Service: Urology;  Laterality: N/A;  . ESOPHAGOGASTRODUODENOSCOPY N/A 04/20/2013   Procedure: ESOPHAGOGASTRODUODENOSCOPY (EGD);  Surgeon: Inda Castle, MD;  Location: [facility];  Service: Endoscopy;  Laterality: N/A;  . ESOPHAGOGASTRODUODENOSCOPY N/A 03/25/2016   Procedure: ESOPHAGOGASTRODUODENOSCOPY (EGD);  Surgeon: Irene Shipper, MD;  Location: [facility];  Service: Endoscopy;  Laterality: N/A;  . ESOPHAGOGASTRODUODENOSCOPY (EGD) WITH PROPOFOL N/A 10/13/2015   Procedure: ESOPHAGOGASTRODUODENOSCOPY (EGD) WITH PROPOFOL;  Surgeon: Jerene Bears, MD;  [facility];  Service: Gastroenterology;  Laterality: N/A;  . FOOT SURGERY  1994 left, 2002 right foot   bilateral foot  reconstruciton  . FOOT SURGERY     reconstruction of both feet- no retained hardware.  Marland Kitchen HERNIA REPAIR    . HIATAL HERNIA REPAIR  01-04-2008  . JOINT REPLACEMENT    . MELANOMA EXCISION  2019  . POLYPECTOMY    . PROSTATE SURGERY     x 2  . SAVORY DILATION N/A 03/25/2016   Procedure: SAVORY DILATION;  Surgeon: Irene Shipper, MD;  [facility];  Service: Endoscopy;  Laterality: N/A;  . TOTAL HIP ARTHROPLASTY  07/21/2011   Procedure: TOTAL HIP ARTHROPLASTY ANTERIOR APPROACH;  Surgeon: Mauri Pole, MD;  Location: [facility];  Service: Orthopedics;  Laterality: Left;  . TOTAL HIP ARTHROPLASTY Right 09/07/2012   Procedure: RIGHT TOTAL HIP ARTHROPLASTY ANTERIOR APPROACH;  Surgeon: Mcarthur Rossetti, MD;  [facility];  Service: Orthopedics;  Laterality: Right;  . TRANSURETHRAL RESECTION OF BLADDER NECK N/A 11/04/2015   Procedure: RESECTION OF BLADDER NECK;  Surgeon: Candee Furbish  McKenzie, MD;  Location: [facility];  Service: Urology;  Laterality: N/A;  . TRANSURETHRAL RESECTION OF PROSTATE N/A 11/04/2015   Procedure: TRANSURETHRAL RESECTION OF THE PROSTATE (TURP); REMOVAL OF UROLIFT IMPLANTS X THREE;  Surgeon: Cleon Gustin, MD;  Location: [facility];  Service: Urology;  Laterality: N/A;  . UPPER GASTROINTESTINAL ENDOSCOPY  12/2013   Dr Hilarie Fredrickson, gastritis  . VIDEO BRONCHOSCOPY Bilateral 02/01/2017   Procedure: VIDEO BRONCHOSCOPY WITHOUT FLUORO;  Surgeon: Tanda Rockers, MD;  [facility];  Service: Cardiopulmonary;  Laterality: Bilateral;       Family History  Problem Relation Age of Onset  . Heart disease Sister   . Heart failure Father   . Other Mother        phebitis related to Federick's birth, he was 52 weeks old when she died  . Colon cancer Neg Hx   . Esophageal cancer Neg Hx   . Rectal cancer Neg Hx   . Stomach cancer Neg Hx   . Colon polyps Neg Hx     Social History   Tobacco Use  . Smoking status: Never Smoker  . Smokeless tobacco: Never Used  Vaping Use  .  Vaping Use: Never used  Substance Use Topics  . Alcohol use: No  . Drug use: No    Home Medications Prior to Admission medications   Medication Sig Start Date End Date Taking? Authorizing Provider  albuterol (PROVENTIL HFA;VENTOLIN HFA) 108 (90 Base) MCG/ACT inhaler Inhale 2 puffs into the lungs every 6 (six) hours as needed for wheezing or shortness of breath. 11/13/17   Tanda Rockers, MD  aspirin EC 81 MG tablet Take 1 tablet (81 mg total) by mouth daily. Swallow whole. 05/20/20   Minus Breeding, MD  bisacodyl (DULCOLAX) 10 MG suppository Place 10 mg rectally as needed for moderate constipation.    [provider]  bromocriptine (PARLODEL) 2.5 MG tablet TAKE  (1)  TABLET TWICE A DAY. Patient taking differently: Take 2.5 mg by mouth See admin instructions. TAKE 1/2 TABLET EVERY MORNING AND 1 TABLET EVERY EVENING 03/16/20   Renato Shin, MD  calcium carbonate (OSCAL) 1500 (600 Ca) MG TABS tablet Take 600 mg of elemental calcium by mouth daily with breakfast.     [provider]  cholecalciferol (VITAMIN D) 1000 UNITS tablet Take 1,000 Units by mouth daily.     [provider]  diclofenac Sodium (VOLTAREN) 1 % GEL Apply 2 g topically 4 (four) times daily. 08/27/19   Ivy Lynn, NP  ferrous sulfate 325 (65 FE) MG tablet Take 1 tablet (325 mg total) by mouth daily with breakfast. 01/23/19   Dettinger, [name], MD  guaiFENesin (MUCINEX) 600 MG 12 hr tablet Take 600 mg by mouth daily.    [provider]  levalbuterol Penne Lash) 0.63 MG/3ML nebulizer solution Take 3 mLs (0.63 mg total) by nebulization every 6 (six) hours as needed for wheezing or shortness of breath. 11/06/17   Martyn Ehrich, NP  levothyroxine (SYNTHROID) 75 MCG tablet Take 1 tablet (75 mcg total) by mouth daily. 04/22/20   Dettinger, [name], MD  naphazoline-pheniramine (NAPHCON-A) 0.025-0.3 % ophthalmic solution Place 1 drop into both eyes 2 (two) times daily as needed for irritation or  allergies.     [provider]  nitroGLYCERIN (NITROSTAT) 0.4 MG SL tablet Place 1 tablet (0.4 mg total) under the tongue every 5 (five) minutes as needed for chest pain. 08/22/16   Chipper Herb, MD  pantoprazole ([place])  40 MG tablet Take 1 tablet (40 mg total) by mouth 2 (two) times daily. 06/10/20   Noralyn Pick, NP  Respiratory Therapy Supplies (FLUTTER) DEVI Twice a day and prn as needed, may increase if feeling worse 05/01/18   Martyn Ehrich, NP  sucralfate (CARAFATE) 1 g tablet Take 1 tablet three times daily as needed 06/06/19   Pyrtle, Lajuan Lines, MD  SYMBICORT 160-4.5 MCG/ACT inhaler INHALE 2 PUFFS 2 TIMES A DAY 04/24/19   Tanda Rockers, MD  tamsulosin (FLOMAX) 0.4 MG CAPS capsule Take 1 capsule (0.4 mg total) by mouth daily after supper. 04/22/20   Dettinger, [name], MD  terazosin (HYTRIN) 1 MG capsule Take 1 capsule (1 mg total) by mouth at bedtime. 02/27/20   Dettinger, [name], MD  triamcinolone cream (KENALOG) 0.1 % Apply 1 application topically 2 (two) times daily. 10/31/18   Baruch Gouty, FNP  vitamin B-12 (CYANOCOBALAMIN) 1000 MCG tablet Take 1,000 mcg by mouth daily.    [provider]  vitamin E 180 MG (400 UNITS) capsule Take 400 Units by mouth daily.    [provider]    Allergies    Betadine [povidone iodine], Gadavist [gadobutrol], Lortab [hydrocodone-acetaminophen], Penicillins, Zetia [ezetimibe], and Zocor [simvastatin]  Review of Systems   Review of Systems  All other systems reviewed and are negative.   Physical Exam Updated Vital Signs BP 130/75 (BP Location: Right Arm)   Pulse 94   Temp 98.2 F (36.8 C) (Oral)   Resp 17   Ht 1.626 m (5\' 4" )   Wt 73.5 kg   SpO2 94%   BMI 27.81 kg/m   Physical Exam Vitals and nursing note reviewed.  Constitutional:      General: He is not in acute distress.    Appearance: He is well-developed.  HENT:     Head: Normocephalic and atraumatic.     Mouth/Throat:     Pharynx: No  oropharyngeal exudate.  Eyes:     General: No scleral icterus.       Right eye: No discharge.        Left eye: No discharge.     Conjunctiva/sclera: Conjunctivae normal.     Pupils: Pupils are equal, round, and reactive to light.  Neck:     Thyroid: No thyromegaly.     Vascular: No JVD.  Cardiovascular:     Rate and Rhythm: Normal rate and regular rhythm.     Heart sounds: Normal heart sounds. No murmur heard. No friction rub. No gallop.   Pulmonary:     Effort: Pulmonary effort is normal. No respiratory distress.     Breath sounds: Wheezing present. No rales.  Abdominal:     General: Bowel sounds are normal. There is no distension.     Palpations: Abdomen is soft. There is no mass.     Tenderness: There is no abdominal tenderness.  Musculoskeletal:        General: No tenderness. Normal range of motion.     Cervical back: Normal range of motion and neck supple.  Lymphadenopathy:     Cervical: No cervical adenopathy.  Skin:    General: Skin is warm and dry.     Findings: No erythema or rash.  Neurological:     Mental Status: He is alert.     Coordination: Coordination normal.  Psychiatric:        Behavior: Behavior normal.     ED Results / Procedures / Treatments   Labs (all labs  ordered are listed, but only abnormal results are displayed) Labs Reviewed  CBC WITH DIFFERENTIAL/PLATELET - Abnormal; Notable for the following components:      Result Value   WBC 15.7 (*)    RBC 2.29 (*)    Hemoglobin 7.6 (*)    HCT 23.2 (*)    MCV 101.3 (*)    Neutro Abs 12.0 (*)    Monocytes Absolute 2.0 (*)    Abs Immature Granulocytes 0.53 (*)    All other components within normal limits  BASIC METABOLIC PANEL - Abnormal; Notable for the following components:   CO2 21 (*)    Glucose, Bld 101 (*)    Calcium 8.3 (*)    All other components within normal limits  RESP PANEL BY RT-PCR (FLU A&B, COVID) ARPGX2  PREPARE RBC (CROSSMATCH)  TYPE AND SCREEN  TROPONIN I (HIGH SENSITIVITY)     EKG EKG Interpretation  Date/Time:  Thursday June 11 2020 20:46:40 EDT Ventricular Rate:  105 PR Interval:  48 QRS Duration: 101 QT Interval:  357 QTC Calculation: 472 R Axis:   61 Text Interpretation: Sinus tachycardia with irregular rate RSR' in V1 or V2, right VCD or RVH Borderline T abnormalities, inferior leads since last tracing no significant change Confirmed by Noemi Chapel [phone]) on 06/11/2020 8:56:05 PM   Radiology DG Chest 2 View  Result Date: 06/11/2020 CLINICAL DATA:  Cough with chest pain and hemoptysis. EXAM: CHEST - 2 VIEW COMPARISON:  June 08, 2020 FINDINGS: Mild, chronic appearing increased lung markings are seen with mild areas of atelectasis and/or early infiltrate seen within the bilateral lung bases. There is no evidence of a pleural effusion or pneumothorax. The heart size and mediastinal contours are within normal limits. Moderate to marked severity calcification of the aortic arch is noted. Chronic fifth and sixth left rib fractures are seen. IMPRESSION: Chronic appearing increased lung markings with mild bibasilar atelectasis and/or early infiltrate. Electronically Signed   By: Virgina Norfolk M.D.   On: 06/11/2020 22:09    Procedures Procedures   Medications Ordered in ED Medications  0.9 %  sodium chloride infusion (Manually program via Guardrails IV Fluids) (has no administration in time range)    ED Course  I have reviewed the triage vital signs and the nursing notes.  Pertinent labs & imaging results that were available during my care of the patient were reviewed by me and considered in my medical decision making (see chart for details).    MDM Rules/Calculators/A&P                          This patient at this time is symptom-free, vital signs are normal, see below.  He has expiratory wheezing which is normal for him but speaks in full sentences without any distress.  The patient does have some history of hemoptysis which is likely  related to bronchial irritation, he has not coughed up any blood in at least 4 days according to his report.  He is also very concerned about his drop in hemoglobin and states that he thinks he might need a blood transfusion.  I have informed him that due to the blood shortage we do only transfuse people below 7.  At this time I will recheck labs, chest x-ray to make sure he does not have a pneumothorax, troponin, will also consult with cardiology given his history of coronary disease  Pt has lower Hgb Pt has neg trop Needs w/u and admit  Discussed with Hospitalist who will admit.  Final Clinical Impression(s) / ED Diagnoses Final diagnoses:  Symptomatic anemia  Gastrointestinal hemorrhage, unspecified gastrointestinal hemorrhage type  Chest pain, unspecified type    Rx / DC Orders ED Discharge Orders    None       Noemi Chapel, MD 06/11/20 2324

## 2020-06-11 NOTE — ED Notes (Signed)
Pt returned from xray

## 2020-06-11 NOTE — ED Notes (Signed)
Pt transported to xray 

## 2020-06-11 NOTE — Progress Notes (Signed)
Assessment & Plan:  1. Hemoptysis Patient to keep his appointment with pulmonology next Wednesday.  2. Guaiac positive stools Patient to keep appointments with gastroenterology.  3. Anemia, unspecified type Hemoglobin dropped from 8.6 yesterday to 7.9 today.  Patient is to return on Monday morning for repeat labs to assess for further drop in his hemoglobin.  He was advised to go to the ER over the weekend if he experiences worsening shortness of breath or dizziness, chest pain, feeling faint or lightheaded, tachycardia, or heart rhythm irregularities. - Hemoglobin, fingerstick   Follow up plan: Return in 4 days (on 06/15/2020) for Anemia.  Hendricks Limes, MSN, APRN, FNP-C [facility]  Subjective:   Patient ID: [name], male    DOB: January 10, 1933, 87 y.o.   MRN: [id number]  HPI: [name] is a 87 y.o. male presenting on 06/11/2020 for Transitions Of Care [name] 4/12- hemoptysis.  Patient states since being out of the hospital he is having SOB, dizziness and fatigue. )  Patient was seen at [facility] 06/08/2020-06/09/2020 due to hemoptysis.  He started coughing up blood on 06/07/2020. Chest CT showed unchanged chronic debris and mucoid impaction in both lower lobes, worse on the right AND an unchanged 4.4 cm ascending thoracic aortic aneurysm. He also had a CT angio chest which showed no evidence of a PE.  It was felt that hemoptysis was due to vigorous coughing in the setting of underlying chronic lung disease.  He was advised to follow-up with his pulmonologist in 1 to 2 weeks which is scheduled for Wednesday, 06/17/2020.  Patient saw his gastroenterologist yesterday.  On rectal exam he had a scant amount of loose black stool that was guaiac positive.  His pantoprazole was increased to 40 mg twice daily.  He is to undergo EGD for further evaluation with Dr. Hilarie Fredrickson on 08/03/2020 needs to be cleared from pulmonology before hand.  H&H was obtained  yesterday at the gastroenterologist which was felt to be stable.  Patient has not been taking his iron supplement as he reports it makes him nauseated.  He has been taking it on an empty stomach.  Patient also reports shortness of breath, dizziness, and fatigue.  He has COPD and is unsure if his shortness of breath is worse than usual.  He also has chronic balance issues and is unsure if this is worse than usual.  Patient denies any further episodes of hemoptysis since he was discharged from the hospital   ROS: Negative unless specifically indicated above in HPI.   Relevant past medical history reviewed and updated as indicated.   Allergies and medications reviewed and updated.   Current Outpatient Medications:  .  albuterol (PROVENTIL HFA;VENTOLIN HFA) 108 (90 Base) MCG/ACT inhaler, Inhale 2 puffs into the lungs every 6 (six) hours as needed for wheezing or shortness of breath., Disp: 18 g, Rfl: 2 .  aspirin EC 81 MG tablet, Take 1 tablet (81 mg total) by mouth daily. Swallow whole., Disp: 90 tablet, Rfl: 3 .  bisacodyl (DULCOLAX) 10 MG suppository, Place 10 mg rectally as needed for moderate constipation., Disp: , Rfl:  .  bromocriptine (PARLODEL) 2.5 MG tablet, TAKE  (1)  TABLET TWICE A DAY. (Patient taking differently: Take 2.5 mg by mouth See admin instructions. TAKE 1/2 TABLET EVERY MORNING AND 1 TABLET EVERY EVENING), Disp: 60 tablet, Rfl: 0 .  calcium carbonate (OSCAL) 1500 (600 Ca) MG TABS tablet, Take 600 mg of elemental calcium by mouth  daily with breakfast. , Disp: , Rfl:  .  cholecalciferol (VITAMIN D) 1000 UNITS tablet, Take 1,000 Units by mouth daily. , Disp: , Rfl:  .  diclofenac Sodium (VOLTAREN) 1 % GEL, Apply 2 g topically 4 (four) times daily., Disp: 50 g, Rfl: 1 .  ferrous sulfate 325 (65 FE) MG tablet, Take 1 tablet (325 mg total) by mouth daily with breakfast., Disp: 90 tablet, Rfl: 3 .  guaiFENesin (MUCINEX) 600 MG 12 hr tablet, Take 600 mg by mouth daily., Disp: , Rfl:   .  levalbuterol (XOPENEX) 0.63 MG/3ML nebulizer solution, Take 3 mLs (0.63 mg total) by nebulization every 6 (six) hours as needed for wheezing or shortness of breath., Disp: 72 mL, Rfl: 1 .  levothyroxine (SYNTHROID) 75 MCG tablet, Take 1 tablet (75 mcg total) by mouth daily., Disp: 90 tablet, Rfl: 3 .  naphazoline-pheniramine (NAPHCON-A) 0.025-0.3 % ophthalmic solution, Place 1 drop into both eyes 2 (two) times daily as needed for irritation or allergies. , Disp: , Rfl:  .  nitroGLYCERIN (NITROSTAT) 0.4 MG SL tablet, Place 1 tablet (0.4 mg total) under the tongue every 5 (five) minutes as needed for chest pain., Disp: 25 tablet, Rfl: 2 .  pantoprazole (PROTONIX) 40 MG tablet, Take 1 tablet (40 mg total) by mouth 2 (two) times daily., Disp: 180 tablet, Rfl: 0 .  Respiratory Therapy Supplies (FLUTTER) DEVI, Twice a day and prn as needed, may increase if feeling worse, Disp: 1 each, Rfl: 0 .  sucralfate (CARAFATE) 1 g tablet, Take 1 tablet three times daily as needed, Disp: 270 tablet, Rfl: 3 .  SYMBICORT 160-4.5 MCG/ACT inhaler, INHALE 2 PUFFS 2 TIMES A DAY, Disp: 10.2 g, Rfl: 5 .  tamsulosin (FLOMAX) 0.4 MG CAPS capsule, Take 1 capsule (0.4 mg total) by mouth daily after supper., Disp: 90 capsule, Rfl: 3 .  terazosin (HYTRIN) 1 MG capsule, Take 1 capsule (1 mg total) by mouth at bedtime., Disp: 90 capsule, Rfl: 3 .  triamcinolone cream (KENALOG) 0.1 %, Apply 1 application topically 2 (two) times daily., Disp: 30 g, Rfl: 0 .  vitamin B-12 (CYANOCOBALAMIN) 1000 MCG tablet, Take 1,000 mcg by mouth daily., Disp: , Rfl:  .  vitamin E 180 MG (400 UNITS) capsule, Take 400 Units by mouth daily., Disp: , Rfl:   Allergies  Allergen Reactions  . Betadine [Povidone Iodine] Other (See Comments)    Reaction:  Blisters   . Gadavist [Gadobutrol]     hives  . Lortab [Hydrocodone-Acetaminophen] Nausea And Vomiting  . Penicillins Other (See Comments)    Reaction:  Lightheadedness  Has patient had a PCN  reaction causing immediate rash, facial/tongue/throat swelling, SOB or lightheadedness with hypotension: Yes Has patient had a PCN reaction causing severe rash involving mucus membranes or skin necrosis: No Has patient had a PCN reaction that required hospitalization No Has patient had a PCN reaction occurring within the last 10 years: No If all of the above answers are "NO", then may proceed with Cephalosporin use.  Marland Kitchen Zetia [Ezetimibe] Other (See Comments)    Reaction:  Muscle weakness   . Zocor [Simvastatin] Nausea Only and Other (See Comments)    Reaction:  Muscle weakness     Objective:   BP 104/64   Pulse 83   Temp (!) 97.3 F (36.3 C) (Temporal)   Ht 5' 4.5" (1.638 m)   Wt 167 lb 3.2 oz (75.8 kg)   SpO2 96%   BMI 28.26 kg/m    Physical Exam  Vitals reviewed.  Constitutional:      General: He is not in acute distress.    Appearance: Normal appearance. He is not ill-appearing, toxic-appearing or diaphoretic.  HENT:     Head: Normocephalic and atraumatic.  Eyes:     General: No scleral icterus.       Right eye: No discharge.        Left eye: No discharge.     Conjunctiva/sclera: Conjunctivae normal.  Cardiovascular:     Rate and Rhythm: Normal rate and regular rhythm.     Heart sounds: Normal heart sounds. No murmur heard. No friction rub. No gallop.   Pulmonary:     Effort: Pulmonary effort is normal. No respiratory distress.     Breath sounds: Normal breath sounds. No stridor. No wheezing, rhonchi or rales.  Musculoskeletal:        General: Normal range of motion.     Cervical back: Normal range of motion.  Skin:    General: Skin is warm and dry.  Neurological:     Mental Status: He is alert and oriented to person, place, and time. Mental status is at baseline.     Gait: Gait abnormal (ambulates with a cane).  Psychiatric:        Mood and Affect: Mood normal.        Behavior: Behavior normal.        Thought Content: Thought content normal.        Judgment:  Judgment normal.

## 2020-06-11 NOTE — ED Provider Notes (Incomplete)
[facility] EMERGENCY DEPARTMENT Provider Note   CSN: [id number] Arrival date & time: 06/11/20  2036     History Chief Complaint  Patient presents with  . Chest Pain    [name] is a 87 y.o. male.  HPI     Past Medical History:  Diagnosis Date  . Abnormality of gait 06/07/2013  . Anemia   . Aneurysm ([place])    on assending aorta, currently watching it, Dr Cyndia Bent  . Anxiety   . Arthritis   . Asthma   . Atrial fibrillation ([place])   . Barrett's esophagus   . CAD (coronary artery disease) 03/2006   3.0 x 20 mm TAXUS Perseus DES to the LAD; 01/2007  L main 30%, oLAD 50%, pLAD stent ok, CFX 80%, OM 60%, pRCA 60%, mRCA 70%, oPDA 90%; med rx   . Cancer ([place])    skin cancer   . Cataract    bilateral removal of cateracts  . Complication of anesthesia     no issues,but pt prefers spinal due to Pulmonary problems  . COPD (chronic obstructive pulmonary disease) ([place])    oxygen  on standby in home.  . Depression   . Diverticulosis   . Emphysema   . Enlarged prostate with urinary retention   . Esophageal stenosis   . GERD (gastroesophageal reflux disease)   . Hiatal hernia   . Hypothyroidism   . Neuropathy   . Osteoporosis   . Pituitary macroadenoma ([place])   . Restless legs syndrome (RLS)   . UGIB (upper gastrointestinal bleed) 03/2013   EGD w/ large ulcer at GE junction    Patient Active Problem List   Diagnosis Date Noted  . Barrett's esophagus 06/09/2020  . Hemoptysis 06/08/2020  . Acquired dilation of ascending aorta and aortic root ([place]) 05/20/2020  . Chest congestion 05/11/2020  . Metatarsalgia of right foot 08/27/2019  . Memory disorder 05/03/2019  . Chronic constipation 10/17/2018  . Peripheral vascular insufficiency ([place]) 07/19/2017  . Aneurysmal dilatation ([place]) 07/19/2017  . Aortic atherosclerosis ([place]) 11/21/2016  . Thoracic ascending aortic aneurysm ([place]) 08/10/2016  . CAD (coronary artery disease) 08/10/2016  . Hiatal hernia 05/04/2016  .  Esophageal stricture   . Afib ([place]) 06/12/2015  . Postoperative anemia due to acute blood loss 06/11/2015  . Neuropathy 12/31/2013  . Vitamin B 12 deficiency 12/31/2013  . History of esophageal ulcer 04/20/2013  . S/P left and ritght THA, AA 07/21/2011  . Special screening for malignant neoplasms, colon 01/03/2011  . Personal history of colonic polyps 01/03/2011  . COPD (chronic obstructive pulmonary disease) ([place]) 08/24/2010  . COLONIC POLYPS 07/17/2008  . Macrocytic anemia 07/17/2008  . Gastroesophageal reflux disease with esophagitis 07/17/2008  . DEGENERATIVE JOINT DISEASE 07/17/2008  . Osteoporosis 07/17/2008  . BPH (benign prostatic hyperplasia) 07/17/2008  . Hyperprolactinemia ([place]) 10/24/2007  . PITUITARY ADENOMA 06/25/2007  . Hypothyroidism 06/25/2007  . Hyperlipidemia LDL goal <70 06/22/2007  . Coronary atherosclerosis 06/22/2007    Past Surgical History:  Procedure Laterality Date  . ABDOMINAL HERNIA REPAIR   2008  . CARDIAC CATHETERIZATION  01/2007   L main 30%, oLAD 50%,  pLAD stent ok, CFX 80%, OM 60%, pRCA 60%, mRCA 70%, oPDA 90%; med rx  . cataract extraction both eyes    . COLONOSCOPY    . CORONARY STENT PLACEMENT  03/2006   3.0 x 20 mm TAXUS Perseus DES to the LAD  . CYSTOSCOPY N/A 06/10/2015   Procedure: CYSTOSCOPY FULGRATION OF BLEEDING,  electovapor  resection;  Surgeon: Irine Seal, MD;  Location: [facility];  Service: Urology;  Laterality: N/A;  . CYSTOSCOPY WITH INSERTION OF UROLIFT N/A 06/01/2015   Procedure: CYSTOSCOPY WITH INSERTION OF UROLIFT x4;  Surgeon: Irine Seal, MD;  Location: [facility];  Service: Urology;  Laterality: N/A;  . ESOPHAGOGASTRODUODENOSCOPY N/A 04/20/2013   Procedure: ESOPHAGOGASTRODUODENOSCOPY (EGD);  Surgeon: Inda Castle, MD;  Location: [facility];  Service: Endoscopy;  Laterality: N/A;  . ESOPHAGOGASTRODUODENOSCOPY N/A 03/25/2016   Procedure: ESOPHAGOGASTRODUODENOSCOPY (EGD);  Surgeon: Irene Shipper, MD;  Location: [facility];   Service: Endoscopy;  Laterality: N/A;  . ESOPHAGOGASTRODUODENOSCOPY (EGD) WITH PROPOFOL N/A 10/13/2015   Procedure: ESOPHAGOGASTRODUODENOSCOPY (EGD) WITH PROPOFOL;  Surgeon: Jerene Bears, MD;  Location: [facility];  Service: Gastroenterology;  Laterality: N/A;  . FOOT SURGERY  1994 left, 2002 right foot   bilateral foot reconstruciton  . FOOT SURGERY     reconstruction of both feet- no retained hardware.  Marland Kitchen HERNIA REPAIR    . HIATAL HERNIA REPAIR  01-04-2008  . JOINT REPLACEMENT    . MELANOMA EXCISION  2019  . POLYPECTOMY    . PROSTATE SURGERY     x 2  . SAVORY DILATION N/A 03/25/2016   Procedure: SAVORY DILATION;  Surgeon: Irene Shipper, MD;  Location: [facility];  Service: Endoscopy;  Laterality: N/A;  . TOTAL HIP ARTHROPLASTY  07/21/2011   Procedure: TOTAL HIP ARTHROPLASTY ANTERIOR APPROACH;  Surgeon: Mauri Pole, MD;  Location: [facility];  Service: Orthopedics;  Laterality: Left;  . TOTAL HIP ARTHROPLASTY Right 09/07/2012   Procedure: RIGHT TOTAL HIP ARTHROPLASTY ANTERIOR APPROACH;  Surgeon: Mcarthur Rossetti, MD;  Location: [facility];  Service: Orthopedics;  Laterality: Right;  . TRANSURETHRAL RESECTION OF BLADDER NECK N/A 11/04/2015   Procedure: RESECTION OF BLADDER NECK;  Surgeon: Cleon Gustin, MD;  Location: [facility];  Service: Urology;  Laterality: N/A;  . TRANSURETHRAL RESECTION OF PROSTATE N/A 11/04/2015   Procedure: TRANSURETHRAL RESECTION OF THE PROSTATE (TURP); REMOVAL OF UROLIFT IMPLANTS X THREE;  Surgeon: Cleon Gustin, MD;  Location: [facility];  Service: Urology;  Laterality: N/A;  . UPPER GASTROINTESTINAL ENDOSCOPY  12/2013   Dr Hilarie Fredrickson, gastritis  . VIDEO BRONCHOSCOPY Bilateral 02/01/2017   Procedure: VIDEO BRONCHOSCOPY WITHOUT FLUORO;  Surgeon: Tanda Rockers, MD;  Location: [facility];  Service: Cardiopulmonary;  Laterality: Bilateral;       Family History  Problem Relation Age of Onset  . Heart disease Sister   . Heart failure Father   . Other Mother         phebitis related to Demontray's birth, he was 6 weeks old when she died  . Colon cancer Neg Hx   . Esophageal cancer Neg Hx   . Rectal cancer Neg Hx   . Stomach cancer Neg Hx   . Colon polyps Neg Hx     Social History   Tobacco Use  . Smoking status: Never Smoker  . Smokeless tobacco: Never Used  Vaping Use  . Vaping Use: Never used  Substance Use Topics  . Alcohol use: No  . Drug use: No    Home Medications Prior to Admission medications   Medication Sig Start Date End Date Taking? Authorizing Provider  albuterol (PROVENTIL HFA;VENTOLIN HFA) 108 (90 Base) MCG/ACT inhaler Inhale 2 puffs into the lungs every 6 (six) hours as needed for wheezing or shortness of breath. 11/13/17   Tanda Rockers, MD  aspirin EC 81 MG tablet Take 1 tablet (81  mg total) by mouth daily. Swallow whole. 05/20/20   Minus Breeding, MD  bisacodyl (DULCOLAX) 10 MG suppository Place 10 mg rectally as needed for moderate constipation.    [provider]  bromocriptine (PARLODEL) 2.5 MG tablet TAKE  (1)  TABLET TWICE A DAY. Patient taking differently: Take 2.5 mg by mouth See admin instructions. TAKE 1/2 TABLET EVERY MORNING AND 1 TABLET EVERY EVENING 03/16/20   Renato Shin, MD  calcium carbonate (OSCAL) 1500 (600 Ca) MG TABS tablet Take 600 mg of elemental calcium by mouth daily with breakfast.     [provider]  cholecalciferol (VITAMIN D) 1000 UNITS tablet Take 1,000 Units by mouth daily.     [provider]  diclofenac Sodium (VOLTAREN) 1 % GEL Apply 2 g topically 4 (four) times daily. 08/27/19   Ivy Lynn, NP  ferrous sulfate 325 (65 FE) MG tablet Take 1 tablet (325 mg total) by mouth daily with breakfast. 01/23/19   Dettinger, [name], MD  guaiFENesin (MUCINEX) 600 MG 12 hr tablet Take 600 mg by mouth daily.    [provider]  levalbuterol Penne Lash) 0.63 MG/3ML nebulizer solution Take 3 mLs (0.63 mg total) by nebulization every 6 (six) hours as needed for wheezing or  shortness of breath. 11/06/17   Martyn Ehrich, NP  levothyroxine (SYNTHROID) 75 MCG tablet Take 1 tablet (75 mcg total) by mouth daily. 04/22/20   Dettinger, [name], MD  naphazoline-pheniramine (NAPHCON-A) 0.025-0.3 % ophthalmic solution Place 1 drop into both eyes 2 (two) times daily as needed for irritation or allergies.     [provider]  nitroGLYCERIN (NITROSTAT) 0.4 MG SL tablet Place 1 tablet (0.4 mg total) under the tongue every 5 (five) minutes as needed for chest pain. 08/22/16   Chipper Herb, MD  pantoprazole (PROTONIX) 40 MG tablet Take 1 tablet (40 mg total) by mouth 2 (two) times daily. 06/10/20   Noralyn Pick, NP  Respiratory Therapy Supplies (FLUTTER) DEVI Twice a day and prn as needed, may increase if feeling worse 05/01/18   Martyn Ehrich, NP  sucralfate (CARAFATE) 1 g tablet Take 1 tablet three times daily as needed 06/06/19   Pyrtle, Lajuan Lines, MD  SYMBICORT 160-4.5 MCG/ACT inhaler INHALE 2 PUFFS 2 TIMES A DAY 04/24/19   Tanda Rockers, MD  tamsulosin (FLOMAX) 0.4 MG CAPS capsule Take 1 capsule (0.4 mg total) by mouth daily after supper. 04/22/20   Dettinger, [name], MD  terazosin (HYTRIN) 1 MG capsule Take 1 capsule (1 mg total) by mouth at bedtime. 02/27/20   Dettinger, [name], MD  triamcinolone cream (KENALOG) 0.1 % Apply 1 application topically 2 (two) times daily. 10/31/18   Baruch Gouty, FNP  vitamin B-12 (CYANOCOBALAMIN) 1000 MCG tablet Take 1,000 mcg by mouth daily.    [provider]  vitamin E 180 MG (400 UNITS) capsule Take 400 Units by mouth daily.    [provider]    Allergies    Betadine [povidone iodine], Gadavist [gadobutrol], Lortab [hydrocodone-acetaminophen], Penicillins, Zetia [ezetimibe], and Zocor [simvastatin]  Review of Systems   Review of Systems  Physical Exam Updated Vital Signs BP 137/60   Pulse 95   Temp 98.2 F (36.8 C) (Oral)   Resp 17   Ht 1.626 m (5\' 4" )   Wt 73.5 kg   SpO2 96%   BMI  27.81 kg/m   Physical Exam  ED Results / Procedures / Treatments   Labs (all labs ordered  are listed, but only abnormal results are displayed) Labs Reviewed - No data to display  EKG EKG Interpretation  Date/Time:  Thursday June 11 2020 20:46:40 EDT Ventricular Rate:  105 PR Interval:  48 QRS Duration: 101 QT Interval:  357 QTC Calculation: 472 R Axis:   61 Text Interpretation: Sinus tachycardia with irregular rate RSR' in V1 or V2, right VCD or RVH Borderline T abnormalities, inferior leads since last tracing no significant change Confirmed by Noemi Chapel [phone]) on 06/11/2020 8:56:05 PM   Radiology No results found.  Procedures Procedures {Remember to document critical care time when appropriate:1}  Medications Ordered in ED Medications - No data to display  ED Course  I have reviewed the triage vital signs and the nursing notes.  Pertinent labs & imaging results that were available during my care of the patient were reviewed by me and considered in my medical decision making (see chart for details).    MDM Rules/Calculators/A&P                          *** Final Clinical Impression(s) / ED Diagnoses Final diagnoses:  None    Rx / DC Orders ED Discharge Orders    None

## 2020-06-12 ENCOUNTER — Encounter (HOSPITAL_COMMUNITY): Payer: Self-pay | Admitting: Family Medicine

## 2020-06-12 DIAGNOSIS — K219 Gastro-esophageal reflux disease without esophagitis: Secondary | ICD-10-CM | POA: Diagnosis present

## 2020-06-12 DIAGNOSIS — N400 Enlarged prostate without lower urinary tract symptoms: Secondary | ICD-10-CM | POA: Diagnosis present

## 2020-06-12 DIAGNOSIS — J449 Chronic obstructive pulmonary disease, unspecified: Secondary | ICD-10-CM | POA: Diagnosis not present

## 2020-06-12 DIAGNOSIS — K2101 Gastro-esophageal reflux disease with esophagitis, with bleeding: Secondary | ICD-10-CM | POA: Diagnosis present

## 2020-06-12 DIAGNOSIS — D122 Benign neoplasm of ascending colon: Secondary | ICD-10-CM | POA: Diagnosis not present

## 2020-06-12 DIAGNOSIS — R042 Hemoptysis: Secondary | ICD-10-CM

## 2020-06-12 DIAGNOSIS — Q438 Other specified congenital malformations of intestine: Secondary | ICD-10-CM | POA: Diagnosis not present

## 2020-06-12 DIAGNOSIS — E039 Hypothyroidism, unspecified: Secondary | ICD-10-CM | POA: Diagnosis present

## 2020-06-12 DIAGNOSIS — Z85820 Personal history of malignant melanoma of skin: Secondary | ICD-10-CM | POA: Diagnosis not present

## 2020-06-12 DIAGNOSIS — Z20822 Contact with and (suspected) exposure to covid-19: Secondary | ICD-10-CM | POA: Diagnosis present

## 2020-06-12 DIAGNOSIS — Z880 Allergy status to penicillin: Secondary | ICD-10-CM | POA: Diagnosis not present

## 2020-06-12 DIAGNOSIS — K635 Polyp of colon: Secondary | ICD-10-CM | POA: Diagnosis present

## 2020-06-12 DIAGNOSIS — Z8711 Personal history of peptic ulcer disease: Secondary | ICD-10-CM | POA: Diagnosis not present

## 2020-06-12 DIAGNOSIS — K5730 Diverticulosis of large intestine without perforation or abscess without bleeding: Secondary | ICD-10-CM | POA: Diagnosis present

## 2020-06-12 DIAGNOSIS — M810 Age-related osteoporosis without current pathological fracture: Secondary | ICD-10-CM | POA: Diagnosis present

## 2020-06-12 DIAGNOSIS — J440 Chronic obstructive pulmonary disease with acute lower respiratory infection: Secondary | ICD-10-CM | POA: Diagnosis present

## 2020-06-12 DIAGNOSIS — I712 Thoracic aortic aneurysm, without rupture: Secondary | ICD-10-CM | POA: Diagnosis present

## 2020-06-12 DIAGNOSIS — F32A Depression, unspecified: Secondary | ICD-10-CM | POA: Diagnosis present

## 2020-06-12 DIAGNOSIS — Z8249 Family history of ischemic heart disease and other diseases of the circulatory system: Secondary | ICD-10-CM | POA: Diagnosis not present

## 2020-06-12 DIAGNOSIS — K449 Diaphragmatic hernia without obstruction or gangrene: Secondary | ICD-10-CM | POA: Diagnosis present

## 2020-06-12 DIAGNOSIS — Z96643 Presence of artificial hip joint, bilateral: Secondary | ICD-10-CM | POA: Diagnosis present

## 2020-06-12 DIAGNOSIS — J189 Pneumonia, unspecified organism: Secondary | ICD-10-CM | POA: Diagnosis present

## 2020-06-12 DIAGNOSIS — K3189 Other diseases of stomach and duodenum: Secondary | ICD-10-CM | POA: Diagnosis not present

## 2020-06-12 DIAGNOSIS — D509 Iron deficiency anemia, unspecified: Secondary | ICD-10-CM | POA: Diagnosis present

## 2020-06-12 DIAGNOSIS — D649 Anemia, unspecified: Secondary | ICD-10-CM | POA: Diagnosis present

## 2020-06-12 DIAGNOSIS — K2100 Gastro-esophageal reflux disease with esophagitis, without bleeding: Secondary | ICD-10-CM | POA: Diagnosis not present

## 2020-06-12 DIAGNOSIS — Z955 Presence of coronary angioplasty implant and graft: Secondary | ICD-10-CM | POA: Diagnosis not present

## 2020-06-12 DIAGNOSIS — Z888 Allergy status to other drugs, medicaments and biological substances status: Secondary | ICD-10-CM | POA: Diagnosis not present

## 2020-06-12 DIAGNOSIS — I4891 Unspecified atrial fibrillation: Secondary | ICD-10-CM | POA: Diagnosis present

## 2020-06-12 DIAGNOSIS — I2510 Atherosclerotic heart disease of native coronary artery without angina pectoris: Secondary | ICD-10-CM | POA: Diagnosis present

## 2020-06-12 LAB — URINALYSIS, ROUTINE W REFLEX MICROSCOPIC
Bacteria, UA: NONE SEEN
Bilirubin Urine: NEGATIVE
Glucose, UA: NEGATIVE mg/dL
Ketones, ur: 20 mg/dL — AB
Leukocytes,Ua: NEGATIVE
Nitrite: NEGATIVE
Protein, ur: NEGATIVE mg/dL
Specific Gravity, Urine: 1.019 (ref 1.005–1.030)
pH: 5 (ref 5.0–8.0)

## 2020-06-12 LAB — COMPREHENSIVE METABOLIC PANEL
ALT: 19 U/L (ref 0–44)
AST: 25 U/L (ref 15–41)
Albumin: 2.9 g/dL — ABNORMAL LOW (ref 3.5–5.0)
Alkaline Phosphatase: 53 U/L (ref 38–126)
Anion gap: 8 (ref 5–15)
BUN: 19 mg/dL (ref 8–23)
CO2: 22 mmol/L (ref 22–32)
Calcium: 8.2 mg/dL — ABNORMAL LOW (ref 8.9–10.3)
Chloride: 107 mmol/L (ref 98–111)
Creatinine, Ser: 0.94 mg/dL (ref 0.61–1.24)
GFR, Estimated: >60 mL/min (ref >60)
Glucose, Bld: 98 mg/dL (ref 70–99)
Potassium: 3.8 mmol/L (ref 3.5–5.1)
Sodium: 137 mmol/L (ref 135–145)
Total Bilirubin: 0.8 mg/dL (ref 0.3–1.2)
Total Protein: 5.3 g/dL — ABNORMAL LOW (ref 6.5–8.1)

## 2020-06-12 LAB — FOLATE: Folate: 11.7 ng/mL (ref >5.9)

## 2020-06-12 LAB — RETICULOCYTES
Immature Retic Fract: 27 % — ABNORMAL HIGH (ref 2.3–15.9)
RBC.: 2.23 MIL/uL — ABNORMAL LOW (ref 4.22–5.81)
Retic Count, Absolute: 69.6 10*3/uL (ref 19.0–186.0)
Retic Ct Pct: 3.2 % — ABNORMAL HIGH (ref 0.4–3.1)

## 2020-06-12 LAB — CBC
HCT: 22.2 % — ABNORMAL LOW (ref 39.0–52.0)
Hemoglobin: 7.2 g/dL — ABNORMAL LOW (ref 13.0–17.0)
MCH: 32.6 pg (ref 26.0–34.0)
MCHC: 32.4 g/dL (ref 30.0–36.0)
MCV: 100.5 fL — ABNORMAL HIGH (ref 80.0–100.0)
Platelets: 206 10*3/uL (ref 150–400)
RBC: 2.21 MIL/uL — ABNORMAL LOW (ref 4.22–5.81)
RDW: 15.5 % (ref 11.5–15.5)
WBC: 17.2 10*3/uL — ABNORMAL HIGH (ref 4.0–10.5)
nRBC: 0 % (ref 0.0–0.2)

## 2020-06-12 LAB — IRON AND TIBC
Iron: 15 ug/dL — ABNORMAL LOW (ref 45–182)
Saturation Ratios: 6 % — ABNORMAL LOW (ref 17.9–39.5)
TIBC: 267 ug/dL (ref 250–450)
UIBC: 252 ug/dL

## 2020-06-12 LAB — RESP PANEL BY RT-PCR (FLU A&B, COVID) ARPGX2
Influenza A by PCR: NEGATIVE
Influenza B by PCR: NEGATIVE
SARS Coronavirus 2 by RT PCR: NEGATIVE

## 2020-06-12 LAB — FERRITIN: Ferritin: 44 ng/mL (ref 24–336)

## 2020-06-12 LAB — MAGNESIUM: Magnesium: 1.7 mg/dL (ref 1.7–2.4)

## 2020-06-12 LAB — VITAMIN B12: Vitamin B-12: 397 pg/mL (ref 180–914)

## 2020-06-12 MED ORDER — SODIUM CHLORIDE 0.9 % IV SOLN: 1.0000 g | INTRAVENOUS | Status: DC

## 2020-06-12 MED ORDER — OXYCODONE HCL 5 MG PO TABS
5.0000 mg | ORAL_TABLET | ORAL | Status: DC | PRN
Start: 2020-06-12 — End: 2020-06-13
  Administered 2020-06-12: 5 mg via ORAL
  Filled 2020-06-12: qty 1

## 2020-06-12 MED ORDER — VITAMIN B-12 1000 MCG PO TABS
1000.0000 ug | ORAL_TABLET | Freq: Every day | ORAL | Status: DC
  Administered 2020-06-12 – 2020-06-13 (×2): 1000 ug via ORAL
  Filled 2020-06-12 (×2): qty 1

## 2020-06-12 MED ORDER — TERAZOSIN HCL 1 MG PO CAPS
1.0000 mg | ORAL_CAPSULE | Freq: Every day | ORAL | Status: DC
  Administered 2020-06-12: 1 mg via ORAL
  Filled 2020-06-12: qty 1

## 2020-06-12 MED ORDER — ONDANSETRON HCL 4 MG/2ML IJ SOLN
4.0000 mg | Freq: Four times a day (QID) | INTRAMUSCULAR | Status: DC | PRN
  Administered 2020-06-12: 4 mg via INTRAVENOUS
  Filled 2020-06-12: qty 2

## 2020-06-12 MED ORDER — BROMOCRIPTINE MESYLATE 2.5 MG PO TABS
1.2500 mg | ORAL_TABLET | Freq: Every day | ORAL | Status: DC
  Filled 2020-06-12 (×4): qty 1

## 2020-06-12 MED ORDER — DOXYCYCLINE HYCLATE 100 MG PO TABS
100.0000 mg | ORAL_TABLET | Freq: Two times a day (BID) | ORAL | Status: DC
  Administered 2020-06-12 – 2020-06-13 (×3): 100 mg via ORAL
  Filled 2020-06-12 (×3): qty 1

## 2020-06-12 MED ORDER — SODIUM CHLORIDE 0.9 % IV SOLN: 500.0000 mg | INTRAVENOUS | Status: DC

## 2020-06-12 MED ORDER — TAMSULOSIN HCL 0.4 MG PO CAPS
0.4000 mg | ORAL_CAPSULE | Freq: Every day | ORAL | Status: DC
  Administered 2020-06-12: 0.4 mg via ORAL
  Filled 2020-06-12: qty 1

## 2020-06-12 MED ORDER — PANTOPRAZOLE SODIUM 40 MG IV SOLR
40.0000 mg | Freq: Two times a day (BID) | INTRAVENOUS | Status: DC
  Administered 2020-06-12 – 2020-06-13 (×3): 40 mg via INTRAVENOUS
  Filled 2020-06-12 (×3): qty 40

## 2020-06-12 MED ORDER — FERROUS SULFATE 325 (65 FE) MG PO TABS
325.0000 mg | ORAL_TABLET | Freq: Every day | ORAL | Status: DC
  Administered 2020-06-12 – 2020-06-13 (×2): 325 mg via ORAL
  Filled 2020-06-12 (×2): qty 1

## 2020-06-12 MED ORDER — MOMETASONE FURO-FORMOTEROL FUM 200-5 MCG/ACT IN AERO
2.0000 | INHALATION_SPRAY | Freq: Two times a day (BID) | RESPIRATORY_TRACT | Status: DC
  Administered 2020-06-12 – 2020-06-13 (×2): 2 via RESPIRATORY_TRACT
  Filled 2020-06-12: qty 8.8

## 2020-06-12 MED ORDER — LEVOTHYROXINE SODIUM 75 MCG PO TABS
75.0000 ug | ORAL_TABLET | Freq: Every day | ORAL | Status: DC
  Administered 2020-06-12 – 2020-06-13 (×2): 75 ug via ORAL
  Filled 2020-06-12 (×2): qty 1

## 2020-06-12 MED ORDER — ACETAMINOPHEN 650 MG RE SUPP
650.0000 mg | Freq: Four times a day (QID) | RECTAL | Status: DC | PRN
Start: 2020-06-11 — End: 2020-06-13

## 2020-06-12 MED ORDER — ALBUTEROL SULFATE HFA 108 (90 BASE) MCG/ACT IN AERS: 2.0000 | INHALATION_SPRAY | Freq: Four times a day (QID) | RESPIRATORY_TRACT | Status: DC | PRN

## 2020-06-12 MED ORDER — BROMOCRIPTINE MESYLATE 2.5 MG PO TABS
2.5000 mg | ORAL_TABLET | Freq: Every day | ORAL | Status: DC
  Administered 2020-06-12: 2.5 mg via ORAL
  Filled 2020-06-12 (×4): qty 1

## 2020-06-12 MED ORDER — ONDANSETRON HCL 4 MG PO TABS: 4.0000 mg | ORAL_TABLET | Freq: Four times a day (QID) | ORAL | Status: DC | PRN

## 2020-06-12 MED ORDER — MAGNESIUM OXIDE 400 (241.3 MG) MG PO TABS
400.0000 mg | ORAL_TABLET | Freq: Two times a day (BID) | ORAL | Status: DC
  Administered 2020-06-12 – 2020-06-13 (×3): 400 mg via ORAL
  Filled 2020-06-12 (×3): qty 1

## 2020-06-12 MED ORDER — ACETAMINOPHEN 325 MG PO TABS
650.0000 mg | ORAL_TABLET | Freq: Four times a day (QID) | ORAL | Status: DC | PRN
  Administered 2020-06-12: 650 mg via ORAL
  Filled 2020-06-12: qty 2

## 2020-06-12 MED ORDER — PEG 3350-KCL-NA BICARB-NACL 420 G PO SOLR
4000.0000 mL | Freq: Once | ORAL | Status: AC
  Administered 2020-06-12: 4000 mL via ORAL

## 2020-06-12 MED ORDER — GUAIFENESIN ER 600 MG PO TB12
600.0000 mg | ORAL_TABLET | Freq: Two times a day (BID) | ORAL | Status: DC
  Administered 2020-06-12 – 2020-06-13 (×2): 600 mg via ORAL
  Filled 2020-06-12 (×2): qty 1

## 2020-06-12 NOTE — Plan of Care (Signed)
  Problem: Education: Goal: Knowledge of General Education information will improve Description: Including pain rating scale, medication(s)/side effects and non-pharmacologic comfort measures Outcome: Progressing   Problem: Clinical Measurements: Goal: Respiratory complications will improve Outcome: Not Progressing   Problem: Pain Managment: Goal: General experience of comfort will improve Outcome: Progressing

## 2020-06-12 NOTE — H&P (Signed)
TRH H&P    Patient Demographics:    [name], is a 87 y.o. male  MRN: [id number]  DOB - Jan 01, 1933  Admit Date - 06/11/2020  Referring MD/NP/PA: Sabra Heck  Outpatient Primary MD for the patient is Dettinger, [name], MD  Patient coming from: Home  Chief complaint- chest pain   HPI:    [name]  is a 87 y.o. male, with history of PUD, hypothyroidism, GERD, COPD, CAD, and more presents to the ED with a chief complaint of chest pain. Patient reports that he was having intermittent chest pain throughout the day. It became worse when he was carrying to ten pound bag of bird seed. He felt excessively fatigued, had chest pain, and had worsening dyspnea. Patient does report baseline dyspnea 2/2 COPD. Rest helped to relieve the pain within minutes, but did not resolve it. He had associated nausea. He denies diaphoresis and palpitations. This episode was not similar to the episode that he had prior to his stent placement. Patient reports that he had been following with the outpatient clinic for anemia. He had hemoptysis on the 11th and 12th. He was seen in the ED for this - he had a negative work up for PE and a CT that showed unchanged mucoid impaction in both lower lobes, worse on the right. Both of these exams also reported an unchanged 4.4 cm ascending thoracic aortic aneurysm as well. Patient had a follow up appt with pulm after this visit where it was noted that his hgb dropped from 10.8 to 8.6. He was seen in office again today and hgb had dropped further to 7.9, and then in the ED 7.6. It was reported that he had a positive FOBT in pulm office, and was "Having it arranged to come in for a transfusion," but before those arrangements could happen he had the chest pain/dyspnea. Patient reports that he has otherwise been in his normal state of health with some chronic constipation, and occasional abdominal pain from  chronic hernia that is reducible and hasn't bothered him for the last 3 days.   Patient has not had any further hemoptysis per his report.  Patient does not smoke, does not drink, and does not use illicit drugs. He is vaccinated for covid, and he is full code.   In the ED  T97.3, HR 83-97, R 14-17 BP 130/75 94% Troponin 8 CXR = Chronic appearing increased lung markings with mild atelectasis vs infiltrate WBC 15.7, Hgb 7.6 Chem = Bicarb 21 EKG rate 105, QTc 472 Transfusion 1 unit ordered in the ED Admission requested for further workup of symptomatic anemia    Review of systems:    In addition to the HPI above,  No Fever-chills, No Headache, No changes with Vision or hearing, No problems swallowing food or Liquids, Admits to chest pain, worsening dyspnea, and chronic cough No Abdominal pain, admits to nausea and no Vomiting, chronic constipation No Blood in stool or Urine, No dysuria, No new skin rashes or bruises, No new joints pains-aches,  No new weakness, tingling, numbness  in any extremity, No recent weight gain or loss, No polyuria, polydypsia or polyphagia, No significant Mental Stressors.  All other systems reviewed and are negative.    Past History of the following :    Past Medical History:  Diagnosis Date  . Abnormality of gait 06/07/2013  . Anemia   . Aneurysm ([place])    on assending aorta, currently watching it, Dr Cyndia Bent  . Anxiety   . Arthritis   . Asthma   . Atrial fibrillation ([place])   . Barrett's esophagus   . CAD (coronary artery disease) 03/2006   3.0 x 20 mm TAXUS Perseus DES to the LAD; 01/2007  L main 30%, oLAD 50%, pLAD stent ok, CFX 80%, OM 60%, pRCA 60%, mRCA 70%, oPDA 90%; med rx   . Cancer ([place])    skin cancer   . Cataract    bilateral removal of cateracts  . Complication of anesthesia     no issues,but pt prefers spinal due to Pulmonary problems  . COPD (chronic obstructive pulmonary disease) ([place])    oxygen  on standby in home.  .  Depression   . Diverticulosis   . Emphysema   . Enlarged prostate with urinary retention   . Esophageal stenosis   . GERD (gastroesophageal reflux disease)   . Hiatal hernia   . Hypothyroidism   . Neuropathy   . Osteoporosis   . Pituitary macroadenoma ([place])   . Restless legs syndrome (RLS)   . UGIB (upper gastrointestinal bleed) 03/2013   EGD w/ large ulcer at GE junction      Past Surgical History:  Procedure Laterality Date  . ABDOMINAL HERNIA REPAIR   2008  . CARDIAC CATHETERIZATION  01/2007   L main 30%, oLAD 50%,  pLAD stent ok, CFX 80%, OM 60%, pRCA 60%, mRCA 70%, oPDA 90%; med rx  . cataract extraction both eyes    . COLONOSCOPY    . CORONARY STENT PLACEMENT  03/2006   3.0 x 20 mm TAXUS Perseus DES to the LAD  . CYSTOSCOPY N/A 06/10/2015   Procedure: CYSTOSCOPY FULGRATION OF BLEEDING,  electovapor resection;  Surgeon: Irine Seal, MD;  Location: [facility];  Service: Urology;  Laterality: N/A;  . CYSTOSCOPY WITH INSERTION OF UROLIFT N/A 06/01/2015   Procedure: CYSTOSCOPY WITH INSERTION OF UROLIFT x4;  Surgeon: Irine Seal, MD;  [facility];  Service: Urology;  Laterality: N/A;  . ESOPHAGOGASTRODUODENOSCOPY N/A 04/20/2013   Procedure: ESOPHAGOGASTRODUODENOSCOPY (EGD);  Surgeon: Inda Castle, MD;  Location: [facility];  Service: Endoscopy;  Laterality: N/A;  . ESOPHAGOGASTRODUODENOSCOPY N/A 03/25/2016   Procedure: ESOPHAGOGASTRODUODENOSCOPY (EGD);  Surgeon: Irene Shipper, MD;  Location: [facility];  Service: Endoscopy;  Laterality: N/A;  . ESOPHAGOGASTRODUODENOSCOPY (EGD) WITH PROPOFOL N/A 10/13/2015   Procedure: ESOPHAGOGASTRODUODENOSCOPY (EGD) WITH PROPOFOL;  Surgeon: Jerene Bears, MD;  [facility];  Service: Gastroenterology;  Laterality: N/A;  . FOOT SURGERY  1994 left, 2002 right foot   bilateral foot reconstruciton  . FOOT SURGERY     reconstruction of both feet- no retained hardware.  Marland Kitchen HERNIA REPAIR    . HIATAL HERNIA REPAIR  01-04-2008  . JOINT  REPLACEMENT    . MELANOMA EXCISION  2019  . POLYPECTOMY    . PROSTATE SURGERY     x 2  . SAVORY DILATION N/A 03/25/2016   Procedure: SAVORY DILATION;  Surgeon: Irene Shipper, MD;  [facility];  Service: Endoscopy;  Laterality: N/A;  . TOTAL HIP ARTHROPLASTY  07/21/2011  Procedure: TOTAL HIP ARTHROPLASTY ANTERIOR APPROACH;  Surgeon: Mauri Pole, MD;  [facility];  Service: Orthopedics;  Laterality: Left;  . TOTAL HIP ARTHROPLASTY Right 09/07/2012   Procedure: RIGHT TOTAL HIP ARTHROPLASTY ANTERIOR APPROACH;  Surgeon: Mcarthur Rossetti, MD;  [facility];  Service: Orthopedics;  Laterality: Right;  . TRANSURETHRAL RESECTION OF BLADDER NECK N/A 11/04/2015   Procedure: RESECTION OF BLADDER NECK;  Surgeon: Cleon Gustin, MD;  Location: [facility];  Service: Urology;  Laterality: N/A;  . TRANSURETHRAL RESECTION OF PROSTATE N/A 11/04/2015   Procedure: TRANSURETHRAL RESECTION OF THE PROSTATE (TURP); REMOVAL OF UROLIFT IMPLANTS X THREE;  Surgeon: Cleon Gustin, MD;  Location: [facility];  Service: Urology;  Laterality: N/A;  . UPPER GASTROINTESTINAL ENDOSCOPY  12/2013   Dr Hilarie Fredrickson, gastritis  . VIDEO BRONCHOSCOPY Bilateral 02/01/2017   Procedure: VIDEO BRONCHOSCOPY WITHOUT FLUORO;  Surgeon: Tanda Rockers, MD;  [facility];  Service: Cardiopulmonary;  Laterality: Bilateral;      Social History:      Social History   Tobacco Use  . Smoking status: Never Smoker  . Smokeless tobacco: Never Used  Substance Use Topics  . Alcohol use: No       Family History :     Family History  Problem Relation Age of Onset  . Heart disease Sister   . Heart failure Father   . Other Mother        phebitis related to Naheim's birth, he was 52 weeks old when she died  . Colon cancer Neg Hx   . Esophageal cancer Neg Hx   . Rectal cancer Neg Hx   . Stomach cancer Neg Hx   . Colon polyps Neg Hx       Home Medications:   Prior to Admission medications   Medication Sig  Start Date End Date Taking? Authorizing Provider  albuterol (PROVENTIL HFA;VENTOLIN HFA) 108 (90 Base) MCG/ACT inhaler Inhale 2 puffs into the lungs every 6 (six) hours as needed for wheezing or shortness of breath. 11/13/17   Tanda Rockers, MD  aspirin EC 81 MG tablet Take 1 tablet (81 mg total) by mouth daily. Swallow whole. 05/20/20   Minus Breeding, MD  bisacodyl (DULCOLAX) 10 MG suppository Place 10 mg rectally as needed for moderate constipation.    [provider]  bromocriptine (PARLODEL) 2.5 MG tablet TAKE  (1)  TABLET TWICE A DAY. Patient taking differently: Take 2.5 mg by mouth See admin instructions. TAKE 1/2 TABLET EVERY MORNING AND 1 TABLET EVERY EVENING 03/16/20   Renato Shin, MD  calcium carbonate (OSCAL) 1500 (600 Ca) MG TABS tablet Take 600 mg of elemental calcium by mouth daily with breakfast.     [provider]  cholecalciferol (VITAMIN D) 1000 UNITS tablet Take 1,000 Units by mouth daily.     [provider]  diclofenac Sodium (VOLTAREN) 1 % GEL Apply 2 g topically 4 (four) times daily. 08/27/19   Ivy Lynn, NP  ferrous sulfate 325 (65 FE) MG tablet Take 1 tablet (325 mg total) by mouth daily with breakfast. 01/23/19   Dettinger, [name], MD  guaiFENesin (MUCINEX) 600 MG 12 hr tablet Take 600 mg by mouth daily.    [provider]  levalbuterol Penne Lash) 0.63 MG/3ML nebulizer solution Take 3 mLs (0.63 mg total) by nebulization every 6 (six) hours as needed for wheezing or shortness of breath. 11/06/17   Martyn Ehrich, NP  levothyroxine (SYNTHROID) 75 MCG tablet Take 1  tablet (75 mcg total) by mouth daily. 04/22/20   Dettinger, [name], MD  naphazoline-pheniramine (NAPHCON-A) 0.025-0.3 % ophthalmic solution Place 1 drop into both eyes 2 (two) times daily as needed for irritation or allergies.     [provider]  nitroGLYCERIN (NITROSTAT) 0.4 MG SL tablet Place 1 tablet (0.4 mg total) under the tongue every 5 (five) minutes as  needed for chest pain. 08/22/16   Chipper Herb, MD  pantoprazole (PROTONIX) 40 MG tablet Take 1 tablet (40 mg total) by mouth 2 (two) times daily. 06/10/20   Noralyn Pick, NP  Respiratory Therapy Supplies (FLUTTER) DEVI Twice a day and prn as needed, may increase if feeling worse 05/01/18   Martyn Ehrich, NP  sucralfate (CARAFATE) 1 g tablet Take 1 tablet three times daily as needed 06/06/19   Pyrtle, Lajuan Lines, MD  SYMBICORT 160-4.5 MCG/ACT inhaler INHALE 2 PUFFS 2 TIMES A DAY 04/24/19   Tanda Rockers, MD  tamsulosin (FLOMAX) 0.4 MG CAPS capsule Take 1 capsule (0.4 mg total) by mouth daily after supper. 04/22/20   Dettinger, [name], MD  terazosin (HYTRIN) 1 MG capsule Take 1 capsule (1 mg total) by mouth at bedtime. 02/27/20   Dettinger, [name], MD  triamcinolone cream (KENALOG) 0.1 % Apply 1 application topically 2 (two) times daily. 10/31/18   Baruch Gouty, FNP  vitamin B-12 (CYANOCOBALAMIN) 1000 MCG tablet Take 1,000 mcg by mouth daily.    [provider]  vitamin E 180 MG (400 UNITS) capsule Take 400 Units by mouth daily.    [provider]     Allergies:     Allergies  Allergen Reactions  . Betadine [Povidone Iodine] Other (See Comments)    Reaction:  Blisters   . Gadavist [Gadobutrol]     hives  . Lortab [Hydrocodone-Acetaminophen] Nausea And Vomiting  . Penicillins Other (See Comments)    Reaction:  Lightheadedness  Has patient had a PCN reaction causing immediate rash, facial/tongue/throat swelling, SOB or lightheadedness with hypotension: Yes Has patient had a PCN reaction causing severe rash involving mucus membranes or skin necrosis: No Has patient had a PCN reaction that required hospitalization No Has patient had a PCN reaction occurring within the last 10 years: No If all of the above answers are "NO", then may proceed with Cephalosporin use.  Marland Kitchen Zetia [Ezetimibe] Other (See Comments)    Reaction:  Muscle weakness   . Zocor [Simvastatin]  Nausea Only and Other (See Comments)    Reaction:  Muscle weakness      Physical Exam:   Vitals  Blood pressure 130/75, pulse 94, temperature 98.2 F (36.8 C), temperature source Oral, resp. rate 17, height 5\' 4"  (1.626 m), weight 73.5 kg, SpO2 94 %.  1.  General:  Patient supine in bed with head of bed elevated, no acute distress  2. Psychiatric: Patient is exceedingly pleasant, alert, oriented x 3, mood and behavior are normal for situation  3. Neurologic: Face symmetric, speech and language are normal, moves all 4 extremities voluntarily, no acute deficits on limited exam  4. HEENMT:  Head is at, [place], pupils are reactive, neck is supple, trachea is midline, mucous membranes are moist  5. Respiratory : Lungs have mild wheezing, no rales, rhonchi, clubbing, or cyanosis  6. Cardiovascular : Heart rate is mildly tachycardic, no murmurs, no ribs, no gallops, trace peripheral edema  7. Gastrointestinal:  Abdomen is soft, non distended, non tender to palpation, bowel sounds active  8.  Skin:  Skin is warm, dry, and intact, without acute lesions or rashes  9.Musculoskeletal:  No calf tenderness, no acute deformity, peripheral pulses palpated.     Data Review:    CBC Recent Labs  Lab 06/08/20 1359 06/09/20 0823 06/10/20 1443 06/11/20 2135  WBC 10.0 9.9  --  15.7*  HGB 10.8* 8.6* 8.6 Repeated and verified X2.* 7.6*  HCT 33.6* 26.3* 25.3 Repeated and verified X2.* 23.2*  PLT 225 199  --  195  MCV 101.2* 100.0  --  101.3*  MCH 32.5 32.7  --  33.2  MCHC 32.1 32.7  --  32.8  RDW 14.8 14.8  --  15.4  LYMPHSABS 1.5  --   --  0.8  MONOABS 0.8  --   --  2.0*  EOSABS 0.0  --   --  0.2  BASOSABS 0.0  --   --  0.1   ------------------------------------------------------------------------------------------------------------------  Results for orders placed or performed during the hospital encounter of 06/11/20 (from the past 48 hour(s))  CBC with Differential/Platelet      Status: Abnormal   Collection Time: 06/11/20  9:35 PM  Result Value Ref Range   WBC 15.7 (H) 4.0 - 10.5 K/uL   RBC 2.29 (L) 4.22 - 5.81 MIL/uL   Hemoglobin 7.6 (L) 13.0 - 17.0 g/dL   HCT 23.2 (L) 39.0 - 52.0 %   MCV 101.3 (H) 80.0 - 100.0 fL   MCH 33.2 26.0 - 34.0 pg   MCHC 32.8 30.0 - 36.0 g/dL   RDW 15.4 11.5 - 15.5 %   Platelets 195 150 - 400 K/uL   nRBC 0.0 0.0 - 0.2 %   Neutrophils Relative % 76 %   Neutro Abs 12.0 (H) 1.7 - 7.7 K/uL   Lymphocytes Relative 5 %   Lymphs Abs 0.8 0.7 - 4.0 K/uL   Monocytes Relative 13 %   Monocytes Absolute 2.0 (H) 0.1 - 1.0 K/uL   Eosinophils Relative 2 %   Eosinophils Absolute 0.2 0.0 - 0.5 K/uL   Basophils Relative 1 %   Basophils Absolute 0.1 0.0 - 0.1 K/uL   Immature Granulocytes 3 %   Abs Immature Granulocytes 0.53 (H) 0.00 - 0.07 K/uL    Comment: Performed at [facility], [address], [place], [place] [address]  Basic metabolic panel     Status: Abnormal   Collection Time: 06/11/20  9:35 PM  Result Value Ref Range   Sodium 135 135 - 145 mmol/L   Potassium 3.8 3.5 - 5.1 mmol/L   Chloride 106 98 - 111 mmol/L   CO2 21 (L) 22 - 32 mmol/L   Glucose, Bld 101 (H) 70 - 99 mg/dL    Comment: Glucose reference range applies only to samples taken after fasting for at least 8 hours.   BUN 23 8 - 23 mg/dL   Creatinine, Ser 0.99 0.61 - 1.24 mg/dL   Calcium 8.3 (L) 8.9 - 10.3 mg/dL   GFR, Estimated >60 >60 mL/min    Comment: (NOTE) Calculated using the CKD-EPI Creatinine Equation (2021)    Anion gap 8 5 - 15    Comment: Performed at [facility], [address] [place], [place] [address]  Troponin I (High Sensitivity)     Status: None   Collection Time: 06/11/20  9:35 PM  Result Value Ref Range   Troponin I (High Sensitivity) 8 <18 ng/L    Comment: (NOTE) Elevated high sensitivity troponin I (hsTnI) values and significant  changes across serial  measurements may suggest ACS but many other  chronic and acute conditions are known to  elevate hsTnI results.  Refer to the "Links" section for chest pain algorithms and additional  guidance. Performed at [facility], [address] [place], [place] [address]   Prepare RBC (crossmatch)     Status: None   Collection Time: 06/11/20 11:00 PM  Result Value Ref Range   Order Confirmation      ORDER PROCESSED BY BLOOD BANK Performed at [facility], [address] [place], [place] [address]   Type and screen [facility]     Status: None (Preliminary result)   Collection Time: 06/11/20 11:05 PM  Result Value Ref Range   ABO/RH(D) O POS    Antibody Screen PENDING    Sample Expiration      06/14/2020,2359 Performed at [facility], [address], [place], [place] [address]    *Note: Due to a large number of results and/or encounters for the requested time period, some results have not been displayed. A complete set of results can be found in Results Review.    Chemistries  Recent Labs  Lab 06/08/20 1359 06/09/20 0510 06/10/20 1443 06/11/20 2135  NA 138 141 138 135  K 4.2 4.3 4.1 3.8  CL 107 110 107 106  CO2 24 24 25  21*  GLUCOSE 82 86 97 101*  BUN 44* 44* 24* 23  CREATININE 0.89 0.88 1.03 0.99  CALCIUM 8.9 8.0* 9.0 8.3*  MG  --  1.8  --   --   AST  --  18  --   --   ALT  --  16  --   --   ALKPHOS  --  41  --   --   BILITOT  --  0.3  --   --    ------------------------------------------------------------------------------------------------------------------  ------------------------------------------------------------------------------------------------------------------ GFR: Estimated Creatinine Clearance: 50.1 mL/min (by C-G formula based on SCr of 0.99 mg/dL). Liver Function Tests: Recent Labs  Lab 06/09/20 0510  AST 18  ALT 16  ALKPHOS 41  BILITOT 0.3  PROT 5.0*  ALBUMIN 2.8*   No results for input(s): LIPASE, AMYLASE in the last 168 hours. No results for input(s): AMMONIA in the last 168 hours. Coagulation Profile: Recent Labs  Lab  06/09/20 0510  INR 1.1   Cardiac Enzymes: No results for input(s): CKTOTAL, CKMB, CKMBINDEX, TROPONINI in the last 168 hours. BNP (last 3 results) No results for input(s): PROBNP in the last 8760 hours. HbA1C: No results for input(s): HGBA1C in the last 72 hours. CBG: No results for input(s): GLUCAP in the last 168 hours. Lipid Profile: No results for input(s): CHOL, HDL, LDLCALC, TRIG, CHOLHDL, LDLDIRECT in the last 72 hours. Thyroid Function Tests: No results for input(s): TSH, T4TOTAL, FREET4, T3FREE, THYROIDAB in the last 72 hours. Anemia Panel: Recent Labs    06/09/20 0510 06/10/20 1443  VITAMINB12 221  --   FOLATE 9.3  --   FERRITIN  --  59  TIBC  --  296  IRON  --  35*    --------------------------------------------------------------------------------------------------------------- Urine analysis:    Component Value Date/Time   COLORURINE YELLOW 11/12/2015 2047   APPEARANCEUR Clear 09/20/2017 1337   LABSPEC 1.020 11/12/2015 2047   PHURINE 6.5 11/12/2015 2047   GLUCOSEU Negative 09/20/2017 1337   HGBUR LARGE (A) 11/12/2015 2047   BILIRUBINUR Negative 09/20/2017 1337   KETONESUR NEGATIVE 11/12/2015 2047   PROTEINUR 1+ (A) 09/20/2017 1337   PROTEINUR >300 (A) 11/12/2015 2047  UROBILINOGEN negative 12/31/2013 1022   UROBILINOGEN 0.2 04/18/2013 2026   NITRITE Negative 09/20/2017 1337   NITRITE POSITIVE (A) 11/12/2015 2047   LEUKOCYTESUR Negative 09/20/2017 1337      Imaging Results:    DG Chest 2 View  Result Date: 06/11/2020 CLINICAL DATA:  Cough with chest pain and hemoptysis. EXAM: CHEST - 2 VIEW COMPARISON:  June 08, 2020 FINDINGS: Mild, chronic appearing increased lung markings are seen with mild areas of atelectasis and/or early infiltrate seen within the bilateral lung bases. There is no evidence of a pleural effusion or pneumothorax. The heart size and mediastinal contours are within normal limits. Moderate to marked severity calcification of the  aortic arch is noted. Chronic fifth and sixth left rib fractures are seen. IMPRESSION: Chronic appearing increased lung markings with mild bibasilar atelectasis and/or early infiltrate. Electronically Signed   By: Virgina Norfolk M.D.   On: 06/11/2020 22:09    My personal review of EKG: Rhythm Sinus arrhythmia, rate 105, QTc 472   Assessment & Plan:    Principal Problem:   Symptomatic anemia Active Problems:   GI bleed   CAP (community acquired pneumonia)   CAD (coronary artery disease)   1. Symptomatic Anemia 1. Hgb drop from 10.8 to 7.6 in a few days 2. FOBT + outpt clinic 3. Transfuse 1 unit 4. No overt bleeding now 5. Hx of hemoptysis 6. Consult GI 7. Closely monitor CBC 2. CAP 1. Possible developing infiltrate on CXR 2. WBC 15.7 3. Recent hemoptysis 4. Start rocephin and zithromax 3. GI bleed 1. FOBT + 2. Decreased hgb 3. Consult Gastro 4. CAD  1. Monitor troponin and EKG 2. Continue home meds 5. Thyroid disease 1. Continue synthroid 6.    DVT Prophylaxis-    SCDs   AM Labs Ordered, also please review Full Orders  Family Communication: Admission, patients condition and plan of care including tests being ordered have been discussed with the patient and son who indicate understanding and agree with the plan and Code Status.  Code Status:  Full Admission status: Observation Time spent in minutes : [place]

## 2020-06-12 NOTE — Progress Notes (Addendum)
UNMATCHED BLOOD PRODUCT NOTE  Compare the patient ID on the blood tag to the patient ID on the hospital armband and Blood Bank armband. Then confirm the unit number on the blood tag matches the unit number on the blood product.  If a discrepancy is discovered return the product to blood bank immediately.   Blood Product Type: PRBC  Unit #: [id number]  Product Code #:[id number] Start Time:1006  Starting Rate:125 ml/hr  Rate increase/decreased  (if applicable):      ml/hr  Rate changed time (if applicable):    Stop Time: 9432   All Other Documentation should be documented within the Blood Admin Flowsheet per policy.

## 2020-06-12 NOTE — Progress Notes (Signed)
Patient seen and evaluated, chart reviewed, please see EMR for updated orders. Please see full H&P dictated by admitting physician Dr. Clearence Ped for same date of service.    Brief Summary:- 87 y.o. male, with history of PUD, hypothyroidism, GERD, COPD, CAD admitted on 06/12/2020 with symptomatic anemia requiring transfusion   A/p 1)Symptomatic acute on chronic iron deficiency anemia---suspect acute blood loss anemia superimposed on patient's chronic iron deficiency anemia -Patient with recent episode of hemoptysis, no frank hematemesis -Stool Hemoccult is ++ve, Hgb down to 7.2 from a baseline usually around 11 -B12 WNL, folate WNL, ferritin WNL -Serum iron and TIBC are low -Transfuse 1 unit of PRBC -Protonix IV -Discussed with Dr. Jenetta Downer from GI service--- plan is for EGD and colonoscopy on 06/13/2020 -Dyspnea on exertion and fatigue persist  2)CAP--- Bronchitis Vs PNA-- given recent hemoptysis cannot rule out aspiration component -No further hemoptysis but cough and dyspnea especially on exertion persist -WBC 15.7 >> 17.2 -No tachycardia, no tachypnea, Does Not meet sepsis criteria -Patient has penicillin allergy -Treat empirically with doxycycline  3)CAD--- no ACS type symptoms, recent LDL 103, HDL 51  4) hypothyroidism--recent TSH 0.869, continue levothyroxine 75 mcg  5) COPD--- stable, continue bronchodilators  6)BPH-- continue Flomax and Hytrin  7)Social/Ethics--care discussed with patient and son at bedside, questions answered -Patient is a full code  -Total care time 39 minutes  Patient seen and evaluated, chart reviewed, please see EMR for updated orders. Please see full H&P dictated by admitting physician Dr. Clearence Ped for same date of service.

## 2020-06-12 NOTE — ED Notes (Signed)
Urine specimen obtained and sent to lab

## 2020-06-12 NOTE — Consult Note (Signed)
Maylon Peppers, M.D. Gastroenterology & Hepatology                                           Patient Name: [name] Account #: $[email or web address]$ @   MRN: [id number] Admission Date: 06/11/2020 Date of Evaluation:  06/12/2020 Time of Evaluation: 1:07 PM   Referring Physician: Roxan Hockey, MD  Chief Complaint: Hemoptysis  HPI:  This is a 87 y.o. male with history of coronary artery disease status post stent placement, atrial fibrillation, Barrett's esophagus, GERD, recurrent esophageal stenosis, osteoporosis, pituitary microadenoma, depression, BPH, hypothyroidism and ascending aortic aneurysm, who comes to the hospital after presenting recurrent episodes of hemoptysis.  The patient reported that he presented recurrent episodes of hemoptysis for the last few days.  He states that he is pretty sure the blood was present only when he coughed.  The patient denies having or vomiting episodes.  He also denied having any melena or hematochezia.  He stated that he is aspirin 81 mg every day but denies any other antiplatelet or anticoagulation, denies taking any NSAIDs.  Due to his complaints he came to the ER on 06/08/2020 to undergo an evaluation for worsening shortness of breath.  At that time he underwent a CT angio of the chest with protocol for PE which was negative for any alteration consistent with thrombosis but there was presence of chronic impaction of the lower lobe bronchi due to mucous impaction.  Notably, the patient presented a decline in his hemoglobin from a baseline of 12.2 in February 2022 to 10.81 06/08/2020 with significant drop down to 7.6 yesterday.  His MCV is 101.  Other labs during admission showed BUN of 23, with a previous value of 44 2 days before.  He also had his iron stores checked today which showed a low iron of 15, ferritin of 44, iron saturation of 6% consistent with iron deficiency anemia.  Patient has been followed by Dr. Hilarie Fredrickson at [facility] for episodes of dysphagia  and esophageal stricture that have required dilations in the past.  He is actually scheduled for a repeat EGD on June 2022.  His most recent EGD was in 08/16/2017, he was found to have a stricture 37 cm from the sisters which measured 10 mm, this was dilated up to 15.5, he also had a 3 cm hiatal hernia.  His last colonoscopy was performed on 01/03/2011, he was found to have diverticulosis.  Based on notes, patient had a positive FOBT at his pulmonologist office.  Past Medical History: SEE CHRONIC ISSSUES: Past Medical History:  Diagnosis Date  . Abnormality of gait 06/07/2013  . Anemia   . Aneurysm ([place])    on assending aorta, currently watching it, Dr Cyndia Bent  . Anxiety   . Arthritis   . Asthma   . Atrial fibrillation ([place])   . Barrett's esophagus   . CAD (coronary artery disease) 03/2006   3.0 x 20 mm TAXUS Perseus DES to the LAD; 01/2007  L main 30%, oLAD 50%, pLAD stent ok, CFX 80%, OM 60%, pRCA 60%, mRCA 70%, oPDA 90%; med rx   . Cancer ([place])    skin cancer   . Cataract    bilateral removal of cateracts  . Complication of anesthesia     no issues,but pt prefers spinal due to Pulmonary problems  . COPD (chronic obstructive pulmonary disease) (HCC)    oxygen  on standby in home.  . Depression   . Diverticulosis   . Emphysema   . Enlarged prostate with urinary retention   . Esophageal stenosis   . GERD (gastroesophageal reflux disease)   . Hiatal hernia   . Hypothyroidism   . Neuropathy   . Osteoporosis   . Pituitary macroadenoma ([place])   . Restless legs syndrome (RLS)   . UGIB (upper gastrointestinal bleed) 03/2013   EGD w/ large ulcer at GE junction   Past Surgical History:  Past Surgical History:  Procedure Laterality Date  . ABDOMINAL HERNIA REPAIR   2008  . CARDIAC CATHETERIZATION  01/2007   L main 30%, oLAD 50%,  pLAD stent ok, CFX 80%, OM 60%, pRCA 60%, mRCA 70%, oPDA 90%; med rx  . cataract extraction both eyes    . COLONOSCOPY    . CORONARY ANGIOPLASTY    .  CORONARY STENT PLACEMENT  03/2006   3.0 x 20 mm TAXUS Perseus DES to the LAD  . CYSTOSCOPY N/A 06/10/2015   Procedure: CYSTOSCOPY FULGRATION OF BLEEDING,  electovapor resection;  Surgeon: Irine Seal, MD;  Location: [facility];  Service: Urology;  Laterality: N/A;  . CYSTOSCOPY WITH INSERTION OF UROLIFT N/A 06/01/2015   Procedure: CYSTOSCOPY WITH INSERTION OF UROLIFT x4;  Surgeon: Irine Seal, MD;  [facility]: [facility];  Service: Urology;  Laterality: N/A;  . ESOPHAGOGASTRODUODENOSCOPY N/A 04/20/2013   Procedure: ESOPHAGOGASTRODUODENOSCOPY (EGD);  Surgeon: Inda Castle, MD;  [facility];  Service: Endoscopy;  Laterality: N/A;  . ESOPHAGOGASTRODUODENOSCOPY N/A 03/25/2016   Procedure: ESOPHAGOGASTRODUODENOSCOPY (EGD);  Surgeon: Irene Shipper, MD;  Location: [facility];  Service: Endoscopy;  Laterality: N/A;  . ESOPHAGOGASTRODUODENOSCOPY (EGD) WITH PROPOFOL N/A 10/13/2015   Procedure: ESOPHAGOGASTRODUODENOSCOPY (EGD) WITH PROPOFOL;  Surgeon: Jerene Bears, MD;  [facility];  Service: Gastroenterology;  Laterality: N/A;  . FOOT SURGERY  1994 left, 2002 right foot   bilateral foot reconstruciton  . FOOT SURGERY     reconstruction of both feet- no retained hardware.  Marland Kitchen HERNIA REPAIR    . HIATAL HERNIA REPAIR  01-04-2008  . JOINT REPLACEMENT    . MELANOMA EXCISION  2019  . POLYPECTOMY    . PROSTATE SURGERY     x 2  . SAVORY DILATION N/A 03/25/2016   Procedure: SAVORY DILATION;  Surgeon: Irene Shipper, MD;  [facility];  Service: Endoscopy;  Laterality: N/A;  . TOTAL HIP ARTHROPLASTY  07/21/2011   Procedure: TOTAL HIP ARTHROPLASTY ANTERIOR APPROACH;  Surgeon: Mauri Pole, MD;  [facility];  Service: Orthopedics;  Laterality: Left;  . TOTAL HIP ARTHROPLASTY Right 09/07/2012   Procedure: RIGHT TOTAL HIP ARTHROPLASTY ANTERIOR APPROACH;  Surgeon: Mcarthur Rossetti, MD;  [facility];  Service: Orthopedics;  Laterality: Right;  . TRANSURETHRAL RESECTION OF BLADDER NECK  N/A 11/04/2015   Procedure: RESECTION OF BLADDER NECK;  Surgeon: Cleon Gustin, MD;  Location: [facility];  Service: Urology;  Laterality: N/A;  . TRANSURETHRAL RESECTION OF PROSTATE N/A 11/04/2015   Procedure: TRANSURETHRAL RESECTION OF THE PROSTATE (TURP); REMOVAL OF UROLIFT IMPLANTS X THREE;  Surgeon: Cleon Gustin, MD;  Location: [facility];  Service: Urology;  Laterality: N/A;  . UPPER GASTROINTESTINAL ENDOSCOPY  12/2013   Dr Hilarie Fredrickson, gastritis  . VIDEO BRONCHOSCOPY Bilateral 02/01/2017   Procedure: VIDEO BRONCHOSCOPY WITHOUT FLUORO;  Surgeon: Tanda Rockers, MD;  [facility];  Service: Cardiopulmonary;  Laterality: Bilateral;   Family History:  Family History  Problem Relation  Age of Onset  . Heart disease Sister   . Heart failure Father   . Other Mother        phebitis related to Luka's birth, he was 18 weeks old when she died  . Colon cancer Neg Hx   . Esophageal cancer Neg Hx   . Rectal cancer Neg Hx   . Stomach cancer Neg Hx   . Colon polyps Neg Hx    Social History:  Social History   Tobacco Use  . Smoking status: Never Smoker  . Smokeless tobacco: Never Used  Vaping Use  . Vaping Use: Never used  Substance Use Topics  . Alcohol use: No  . Drug use: No    Home Medications:  Prior to Admission medications   Medication Sig Start Date End Date Taking? Authorizing Provider  albuterol (PROVENTIL HFA;VENTOLIN HFA) 108 (90 Base) MCG/ACT inhaler Inhale 2 puffs into the lungs every 6 (six) hours as needed for wheezing or shortness of breath. 11/13/17  Yes Tanda Rockers, MD  aspirin EC 81 MG tablet Take 1 tablet (81 mg total) by mouth daily. Swallow whole. 05/20/20  Yes Minus Breeding, MD  bisacodyl (DULCOLAX) 10 MG suppository Place 10 mg rectally as needed for moderate constipation.   Yes [provider]  bromocriptine (PARLODEL) 2.5 MG tablet TAKE  (1)  TABLET TWICE A DAY. Patient taking differently: Take 2.5 mg by mouth See admin instructions. TAKE 1/2  TABLET EVERY MORNING AND 1 TABLET EVERY EVENING 03/16/20  Yes Renato Shin, MD  cholecalciferol (VITAMIN D) 1000 UNITS tablet Take 1,000 Units by mouth daily.    Yes [provider]  diclofenac Sodium (VOLTAREN) 1 % GEL Apply 2 g topically 4 (four) times daily. 08/27/19  Yes Ivy Lynn, NP  ferrous sulfate 325 (65 FE) MG tablet Take 1 tablet (325 mg total) by mouth daily with breakfast. 01/23/19  Yes Dettinger, [name], MD  guaiFENesin (MUCINEX) 600 MG 12 hr tablet Take 600 mg by mouth daily.   Yes [provider]  levothyroxine (SYNTHROID) 75 MCG tablet Take 1 tablet (75 mcg total) by mouth daily. 04/22/20  Yes Dettinger, [name], MD  naphazoline-pheniramine (NAPHCON-A) 0.025-0.3 % ophthalmic solution Place 1 drop into both eyes 2 (two) times daily as needed for irritation or allergies.    Yes [provider]  pantoprazole (PROTONIX) 40 MG tablet Take 1 tablet (40 mg total) by mouth 2 (two) times daily. 06/10/20  Yes Noralyn Pick, NP  sucralfate (CARAFATE) 1 g tablet Take 1 tablet three times daily as needed Patient taking differently: Take 1 g by mouth 3 (three) times daily as needed (stomach). 06/06/19  Yes Pyrtle, Lajuan Lines, MD  SYMBICORT 160-4.5 MCG/ACT inhaler INHALE 2 PUFFS 2 TIMES A DAY Patient taking differently: Inhale 2 puffs into the lungs in the morning and at bedtime. 04/24/19  Yes Tanda Rockers, MD  tamsulosin (FLOMAX) 0.4 MG CAPS capsule Take 1 capsule (0.4 mg total) by mouth daily after supper. 04/22/20  Yes Dettinger, [name], MD  triamcinolone cream (KENALOG) 0.1 % Apply 1 application topically 2 (two) times daily. 10/31/18  Yes Rakes, Connye Burkitt, FNP  vitamin B-12 (CYANOCOBALAMIN) 1000 MCG tablet Take 1,000 mcg by mouth daily.   Yes [provider]  vitamin E 180 MG (400 UNITS) capsule Take 400 Units by mouth daily.   Yes [provider]  levalbuterol (XOPENEX) 0.63 MG/3ML nebulizer solution Take 3 mLs (0.63 mg total) by  nebulization every 6 (  six) hours as needed for wheezing or shortness of breath. Patient not taking: Reported on 06/12/2020 11/06/17   Martyn Ehrich, NP  nitroGLYCERIN (NITROSTAT) 0.4 MG SL tablet Place 1 tablet (0.4 mg total) under the tongue every 5 (five) minutes as needed for chest pain. 08/22/16   Chipper Herb, MD  Respiratory Therapy Supplies (FLUTTER) DEVI Twice a day and prn as needed, may increase if feeling worse 05/01/18   Martyn Ehrich, NP  terazosin (HYTRIN) 1 MG capsule Take 1 capsule (1 mg total) by mouth at bedtime. Patient not taking: Reported on 06/12/2020 02/27/20   Dettinger, [name], MD    Inpatient Medications:  Current Facility-Administered Medications:  .  0.9 %  sodium chloride infusion (Manually program via Guardrails IV Fluids), , Intravenous, Once, Zierle-Ghosh, Asia B, DO .  acetaminophen (TYLENOL) tablet 650 mg, 650 mg, Oral, Q6H PRN **OR** acetaminophen (TYLENOL) suppository 650 mg, 650 mg, Rectal, Q6H PRN, Zierle-Ghosh, Asia B, DO .  albuterol (VENTOLIN HFA) 108 (90 Base) MCG/ACT inhaler 2 puff, 2 puff, Inhalation, Q6H PRN, Zierle-Ghosh, Asia B, DO .  bromocriptine (PARLODEL) tablet 1.25 mg, 1.25 mg, Oral, Daily, Zierle-Ghosh, Asia B, DO .  bromocriptine (PARLODEL) tablet 2.5 mg, 2.5 mg, Oral, QHS, Zierle-Ghosh, Asia B, DO .  ferrous sulfate tablet 325 mg, 325 mg, Oral, Q breakfast, Zierle-Ghosh, Asia B, DO, 325 mg at 06/12/20 1301 .  levothyroxine (SYNTHROID) tablet 75 mcg, 75 mcg, Oral, Daily, Zierle-Ghosh, Asia B, DO, 75 mcg at 06/12/20 0615 .  mometasone-formoterol (DULERA) 200-5 MCG/ACT inhaler 2 puff, 2 puff, Inhalation, BID, Zierle-Ghosh, Asia B, DO .  ondansetron (ZOFRAN) tablet 4 mg, 4 mg, Oral, Q6H PRN **OR** ondansetron (ZOFRAN) injection 4 mg, 4 mg, Intravenous, Q6H PRN, Zierle-Ghosh, Asia B, DO .  oxyCODONE (Oxy IR/ROXICODONE) immediate release tablet 5 mg, 5 mg, Oral, Q4H PRN, Zierle-Ghosh, Asia B, DO .  pantoprazole (PROTONIX) injection 40 mg,  40 mg, Intravenous, Q12H, Zierle-Ghosh, Asia B, DO, 40 mg at 06/12/20 1301 .  tamsulosin (FLOMAX) capsule 0.4 mg, 0.4 mg, Oral, QPC supper, Zierle-Ghosh, Asia B, DO .  terazosin (HYTRIN) capsule 1 mg, 1 mg, Oral, QHS, Zierle-Ghosh, Asia B, DO .  vitamin B-12 (CYANOCOBALAMIN) tablet 1,000 mcg, 1,000 mcg, Oral, Daily, Zierle-Ghosh, Asia B, DO, 1,000 mcg at 06/12/20 1301 Allergies: Betadine [povidone iodine], Gadavist [gadobutrol], Lortab [hydrocodone-acetaminophen], Penicillins, Zetia [ezetimibe], and Zocor [simvastatin]  Complete Review of Systems: GENERAL: negative for malaise, night sweats HEENT: No changes in hearing or vision, no nose bleeds or other nasal problems. NECK: Negative for lumps, goiter, pain and significant neck swelling RESPIRATORY: Negative for cough, wheezing CARDIOVASCULAR: Negative for chest pain, leg swelling, palpitations, orthopnea GI: SEE HPI MUSCULOSKELETAL: Negative for joint pain or swelling, back pain, and muscle pain. SKIN: Negative for lesions, rash PSYCH: Negative for sleep disturbance, mood disorder and recent psychosocial stressors. HEMATOLOGY Negative for prolonged bleeding, bruising easily, and swollen nodes. ENDOCRINE: Negative for cold or heat intolerance, polyuria, polydipsia and goiter. NEURO: negative for tremor, gait imbalance, syncope and seizures. The remainder of the review of systems is noncontributory.  Physical Exam: BP (!) 116/58   Pulse 72   Temp 98 F (36.7 C) (Oral)   Resp 16   Ht $R'5\' 4"'qb$  (1.626 m)   Wt 74.6 kg   SpO2 98%   BMI 28.23 kg/m  GENERAL: The patient is AO x3, in no acute distress. Elder. HEENT: Head is normocephalic and atraumatic. EOMI are intact. Mouth is well hydrated and without lesions. NECK:  Supple. No masses LUNGS: Clear to auscultation. No presence of rhonchi/wheezing/rales. Adequate chest expansion HEART: RRR, normal s1 and s2. ABDOMEN: Soft, nontender, no guarding, no peritoneal signs, and nondistended. BS +.  No masses. EXTREMITIES: Without any cyanosis, clubbing, rash, lesions or edema. NEUROLOGIC: AOx3, no focal motor deficit. SKIN: no jaundice, no rashes  Laboratory Data CBC:     Component Value Date/Time   WBC 17.2 (H) 06/12/2020 0501   RBC 2.23 (L) 06/12/2020 0501   RBC 2.21 (L) 06/12/2020 0501   HGB 7.2 (L) 06/12/2020 0501   HGB 12.2 (L) 04/22/2020 1125   HCT 22.2 (L) 06/12/2020 0501   HCT 36.9 (L) 04/22/2020 1125   PLT 206 06/12/2020 0501   PLT 246 04/22/2020 1125   MCV 100.5 (H) 06/12/2020 0501   MCV 96 04/22/2020 1125   MCH 32.6 06/12/2020 0501   MCHC 32.4 06/12/2020 0501   RDW 15.5 06/12/2020 0501   RDW 14.1 04/22/2020 1125   LYMPHSABS 0.8 06/11/2020 2135   LYMPHSABS 2.4 04/22/2020 1125   MONOABS 2.0 (H) 06/11/2020 2135   EOSABS 0.2 06/11/2020 2135   EOSABS 0.4 04/22/2020 1125   BASOSABS 0.1 06/11/2020 2135   BASOSABS 0.2 04/22/2020 1125   COAG:  Lab Results  Component Value Date   INR 1.1 06/09/2020   INR 1.20 04/18/2013   INR 0.95 09/03/2012    BMP:  BMP Latest Ref Rng & Units 06/12/2020 06/11/2020 06/10/2020  Glucose 70 - 99 mg/dL 98 101(H) 97  BUN 8 - 23 mg/dL 19 23 24(H)  Creatinine 0.61 - 1.24 mg/dL 0.94 0.99 1.03  BUN/Creat Ratio 10 - 24 - - -  Sodium 135 - 145 mmol/L 137 135 138  Potassium 3.5 - 5.1 mmol/L 3.8 3.8 4.1  Chloride 98 - 111 mmol/L 107 106 107  CO2 22 - 32 mmol/L 22 21(L) 25  Calcium 8.9 - 10.3 mg/dL 8.2(L) 8.3(L) 9.0    HEPATIC:  Hepatic Function Latest Ref Rng & Units 06/12/2020 06/09/2020 04/22/2020  Total Protein 6.5 - 8.1 g/dL 5.3(L) 5.0(L) 6.6  Albumin 3.5 - 5.0 g/dL 2.9(L) 2.8(L) 3.9  AST 15 - 41 U/L $Remo'25 18 22  'mGjCF$ ALT 0 - 44 U/L $Remo'19 16 15  'egbJw$ Alk Phosphatase 38 - 126 U/L 53 41 79  Total Bilirubin 0.3 - 1.2 mg/dL 0.8 0.3 0.3  Bilirubin, Direct 0.00 - 0.40 mg/dL - - -    CARDIAC:  Lab Results  Component Value Date   CKTOTAL 39 10/25/2009   CKMB 2.1 10/25/2009   TROPONINI 0.57 ([place]) 06/12/2015     Imaging: I personally reviewed and  interpreted the available imaging.  Assessment & Plan: This is a 87 y.o. male with history of coronary artery disease status post stent placement, atrial fibrillation, Barrett's esophagus, GERD, recurrent esophageal stenosis, osteoporosis, pituitary microadenoma, depression, BPH, hypothyroidism and ascending aortic aneurysm, who comes to the hospital after presenting recurrent episodes of hemoptysis.  Gastroenterology was consulted as the patient had progressive decline of his hemoglobin, found to have severe anemia requiring transfuion.  He is not presenting any overt symptoms of gastrointestinal bleeding clinically and his BUN is low.  However, he had a positive FOBT as outpatient and has required blood transfusion during his hospitalization given fast drop in his hemoglobin.  Due to this, I discussed with the patient and his son regarding performing endoscopic evaluation of his anemia with an EGD and colonoscopy, which they agreed to proceed with.  It is also important to consider that he has presented  episodes of hemoptysis that could explain the drop in his hemoglobin, for which I counseled the patient to follow-up with his pulmonologist upon discharge to address the symptoms further.  # Iron deficiency anemia # Hemoptysis - Repeat CBC after transfusion and every day, transfuse if Hb <8 - Pantoprazole 40 mg q12h IVP for now - 2 large bore IV lines - Active T/S - Keep NPO after MN - Avoid NSAIDs - Will proceed with EGD and colonoscopy tomorrow - Patient to follow with pulmonologist regarding hemoptysis  Harvel Quale, MD Gastroenterology and Hepatology [facility]

## 2020-06-13 ENCOUNTER — Encounter (HOSPITAL_COMMUNITY): Payer: Self-pay | Admitting: Family Medicine

## 2020-06-13 ENCOUNTER — Inpatient Hospital Stay (HOSPITAL_COMMUNITY): Payer: Medicare Other | Admitting: Anesthesiology

## 2020-06-13 ENCOUNTER — Encounter (HOSPITAL_COMMUNITY)
Admission: EM | Disposition: A | Discharge status: Home / Self Care | Payer: Self-pay | Source: Home / Self Care | Attending: Family Medicine

## 2020-06-13 DIAGNOSIS — K3189 Other diseases of stomach and duodenum: Secondary | ICD-10-CM

## 2020-06-13 DIAGNOSIS — K5730 Diverticulosis of large intestine without perforation or abscess without bleeding: Secondary | ICD-10-CM | POA: Diagnosis not present

## 2020-06-13 DIAGNOSIS — K449 Diaphragmatic hernia without obstruction or gangrene: Secondary | ICD-10-CM

## 2020-06-13 DIAGNOSIS — D122 Benign neoplasm of ascending colon: Secondary | ICD-10-CM | POA: Diagnosis not present

## 2020-06-13 DIAGNOSIS — K2100 Gastro-esophageal reflux disease with esophagitis, without bleeding: Secondary | ICD-10-CM | POA: Diagnosis not present

## 2020-06-13 DIAGNOSIS — D649 Anemia, unspecified: Secondary | ICD-10-CM | POA: Diagnosis not present

## 2020-06-13 HISTORY — PX: POLYPECTOMY: SHX5525

## 2020-06-13 HISTORY — PX: COLONOSCOPY WITH PROPOFOL: SHX5780

## 2020-06-13 HISTORY — PX: ESOPHAGOGASTRODUODENOSCOPY (EGD) WITH PROPOFOL: SHX5813

## 2020-06-13 HISTORY — PX: BIOPSY: SHX5522

## 2020-06-13 LAB — BASIC METABOLIC PANEL
Anion gap: 7 (ref 5–15)
BUN: 13 mg/dL (ref 8–23)
CO2: 23 mmol/L (ref 22–32)
Calcium: 7.9 mg/dL — ABNORMAL LOW (ref 8.9–10.3)
Chloride: 107 mmol/L (ref 98–111)
Creatinine, Ser: 1.01 mg/dL (ref 0.61–1.24)
GFR, Estimated: >60 mL/min (ref >60)
Glucose, Bld: 90 mg/dL (ref 70–99)
Potassium: 3.7 mmol/L (ref 3.5–5.1)
Sodium: 137 mmol/L (ref 135–145)

## 2020-06-13 LAB — CBC
HCT: 25 % — ABNORMAL LOW (ref 39.0–52.0)
Hemoglobin: 8.1 g/dL — ABNORMAL LOW (ref 13.0–17.0)
MCH: 32 pg (ref 26.0–34.0)
MCHC: 32.4 g/dL (ref 30.0–36.0)
MCV: 98.8 fL (ref 80.0–100.0)
Platelets: 208 10*3/uL (ref 150–400)
RBC: 2.53 MIL/uL — ABNORMAL LOW (ref 4.22–5.81)
RDW: 16.2 % — ABNORMAL HIGH (ref 11.5–15.5)
WBC: 11.3 10*3/uL — ABNORMAL HIGH (ref 4.0–10.5)
nRBC: 0 % (ref 0.0–0.2)

## 2020-06-13 LAB — GLUCOSE, CAPILLARY
Glucose-Capillary: 105 mg/dL — ABNORMAL HIGH (ref 70–99)
Glucose-Capillary: 129 mg/dL — ABNORMAL HIGH (ref 70–99)
Glucose-Capillary: 85 mg/dL (ref 70–99)
Glucose-Capillary: 95 mg/dL (ref 70–99)

## 2020-06-13 LAB — HEMOGLOBIN AND HEMATOCRIT, BLOOD
HCT: 27.4 % — ABNORMAL LOW (ref 39.0–52.0)
Hemoglobin: 8.8 g/dL — ABNORMAL LOW (ref 13.0–17.0)

## 2020-06-13 SURGERY — ESOPHAGOGASTRODUODENOSCOPY (EGD) WITH PROPOFOL
Anesthesia: General

## 2020-06-13 MED ORDER — SODIUM CHLORIDE 0.9 % IV SOLN: INTRAVENOUS | Status: DC

## 2020-06-13 MED ORDER — PROPOFOL 10 MG/ML IV BOLUS
INTRAVENOUS | Status: DC | PRN
  Administered 2020-06-13: 180 mg via INTRAVENOUS
  Administered 2020-06-13 (×2): 90 mg via INTRAVENOUS

## 2020-06-13 MED ORDER — LACTATED RINGERS IV SOLN: INTRAVENOUS | Status: DC

## 2020-06-13 MED ORDER — LACTATED RINGERS IV SOLN: INTRAVENOUS | Status: DC | PRN

## 2020-06-13 MED ORDER — DOXYCYCLINE HYCLATE 100 MG PO TABS: 100.0000 mg | ORAL_TABLET | Freq: Two times a day (BID) | ORAL | 0 refills | Status: AC

## 2020-06-13 MED ORDER — PROPOFOL 10 MG/ML IV BOLUS
INTRAVENOUS | Status: AC
  Filled 2020-06-13: qty 40

## 2020-06-13 MED ORDER — STERILE WATER FOR IRRIGATION IR SOLN
Status: DC | PRN
  Administered 2020-06-13: 200 mL

## 2020-06-13 MED ORDER — PHENYLEPHRINE HCL (PRESSORS) 10 MG/ML IV SOLN
INTRAVENOUS | Status: DC | PRN
  Administered 2020-06-13 (×2): 40 ug via INTRAVENOUS

## 2020-06-13 MED ORDER — MAGNESIUM OXIDE 400 (241.3 MG) MG PO TABS
400.0000 mg | ORAL_TABLET | Freq: Two times a day (BID) | ORAL | 0 refills | Status: AC
Start: 2020-06-13 — End: ?

## 2020-06-13 NOTE — Transfer of Care (Signed)
Immediate Anesthesia Transfer of Care Note  Patient: [name]  Procedure(s) Performed: ESOPHAGOGASTRODUODENOSCOPY (EGD) WITH PROPOFOL (N/A ) COLONOSCOPY WITH PROPOFOL (N/A ) BIOPSY POLYPECTOMY  Patient Location: PACU  Anesthesia Type:General  Level of Consciousness: awake and drowsy  Airway & Oxygen Therapy: Patient Spontanous Breathing  Post-op Assessment: Report given to RN and Post -op Vital signs reviewed and stable  Post vital signs: Reviewed and stable  Last Vitals:  Vitals Value Taken Time  BP    Temp    Pulse 79 06/13/20 1008  Resp 30 06/13/20 1008  SpO2 95 % 06/13/20 1008  Vitals shown include unvalidated device data.  Last Pain:  Vitals:   06/13/20 0845  TempSrc: Oral  PainSc: 0-No pain      Patients Stated Pain Goal: 0 (69/48/54 6270)  Complications: No complications documented.

## 2020-06-13 NOTE — Progress Notes (Signed)
Patient accompanied by son, leaving in private vehicle, no s/s of distress

## 2020-06-13 NOTE — Op Note (Signed)
[facility] Patient Name: [name] Procedure Date: 06/13/2020 8:38 AM MRN: [id number] Date of Birth: August 06, 1932 Attending MD: Maylon Peppers ,  CSN: [id number] Age: 87 Admit Type: Outpatient Procedure:                Upper GI endoscopy Indications:              Anemia, hemoptysis Providers:                Maylon Peppers, Lurline Del, RN, Casimer Bilis, Technician Referring MD:              Medicines:                Monitored Anesthesia Care Complications:            No immediate complications. Estimated Blood Loss:     Estimated blood loss: none. Procedure:                Pre-Anesthesia Assessment:                           - Prior to the procedure, a History and Physical                            was performed, and patient medications, allergies                            and sensitivities were reviewed. The patient's                            tolerance of previous anesthesia was reviewed.                           - The risks and benefits of the procedure and the                            sedation options and risks were discussed with the                            patient. All questions were answered and informed                            consent was obtained.                           - ASA Grade Assessment: III - A patient with severe                            systemic disease.                           After obtaining informed consent, the endoscope was                            passed under direct vision. Throughout the  procedure, the patient's blood pressure, pulse, and                            oxygen saturations were monitored continuously. The                            [id number]-[id number] ([id number]) scope was introduced through the                            mouth, and advanced to the second part of duodenum.                            The upper GI endoscopy was accomplished without                             difficulty. The patient tolerated the procedure                            well. Scope In: 9:06:26 AM Scope Out: 9:14:13 AM Total Procedure Duration: 0 hours 7 minutes 47 seconds  Findings:      LA Grade D (one or more mucosal breaks involving at least 75% of       esophageal circumference) esophagitis with no bleeding was found in the       lower third of the esophagus, extended 2 cm proximal to the GE junction.       The scope easily passed this area without resistance.      A 2 cm hiatal hernia was present.      Localized granular mucosa with possible fibrin debris was found on the       posterior wall of the gastric body. Biopsies were taken with a cold       forceps for histology.      The examined duodenum was normal. Impression:               - LA Grade D reflux esophagitis with no bleeding.                           - 2 cm hiatal hernia.                           - Granular gastric mucosa. Biopsied.                           - Normal examined duodenum. Moderate Sedation:      Per Anesthesia Care Recommendation:           - Return patient to hospital ward for ongoing care.                           - Resume previous diet.                           - Await pathology results.                           - Continue omeprazole 40 mg every 12 hours PO for 3  months.                           - Repeat EGD in June as previously scheduled with                            Dr. Hilarie Fredrickson. Procedure Code(s):        --- Professional ---                           [phone], Esophagogastroduodenoscopy, flexible,                            transoral; with biopsy, single or multiple Diagnosis Code(s):        --- Professional ---                           K21.00, Gastro-esophageal reflux disease with                            esophagitis, without bleeding                           K44.9, Diaphragmatic hernia without obstruction or                            gangrene                            K31.89, Other diseases of stomach and duodenum                           D64.9, Anemia, unspecified CPT copyright 2019 [employer]. All rights reserved. The codes documented in this report are preliminary and upon coder review may  be revised to meet current compliance requirements. Maylon Peppers, MD Maylon Peppers,  06/13/2020 10:05:06 AM This report has been signed electronically. Number of Addenda: 0

## 2020-06-13 NOTE — Op Note (Addendum)
[facility] Patient Name: [name] Procedure Date: 06/13/2020 9:16 AM MRN: [id number] Date of Birth: 1932/11/15 Attending MD: Maylon Peppers ,  CSN: [id number] Age: 87 Admit Type: Outpatient Procedure:                Colonoscopy Indications:              Anemia Providers:                Maylon Peppers, Lurline Del, RN, Casimer Bilis, Technician Referring MD:              Medicines:                Monitored Anesthesia Care Complications:            No immediate complications. Estimated Blood Loss:     Estimated blood loss: none. Procedure:                Pre-Anesthesia Assessment:                           - Prior to the procedure, a History and Physical                            was performed, and patient medications, allergies                            and sensitivities were reviewed. The patient's                            tolerance of previous anesthesia was reviewed.                           - The risks and benefits of the procedure and the                            sedation options and risks were discussed with the                            patient. All questions were answered and informed                            consent was obtained.                           - ASA Grade Assessment: III - A patient with severe                            systemic disease.                           After obtaining informed consent, the colonoscope                            was passed under direct vision. Throughout the  procedure, the patient's blood pressure, pulse, and                            oxygen saturations were monitored continuously. The                            [id number]-HQ190L ([id number]) scope was introduced through                            the anus and advanced to the the cecum, identified                            by appendiceal orifice and ileocecal valve. The                            colonoscopy  was performed with moderate difficulty                            due to inadequate bowel prep and a redundant colon.                            Successful completion of the procedure was aided by                            applying abdominal pressure and lavage. The patient                            tolerated the procedure well. The quality of the                            bowel preparation was inadequate. Scope In: 9:20:07 AM Scope Out: 9:59:04 AM Scope Withdrawal Time: 0 hours 13 minutes 59 seconds  Total Procedure Duration: 0 hours 38 minutes 57 seconds  Findings:      The perianal and digital rectal examinations were normal.      A 2 mm polyp was found in the ascending colon. The polyp was sessile.       The polyp was removed with a cold biopsy forceps. Resection and       retrieval were complete.      Multiple small and large-mouthed diverticula were found in the sigmoid       colon and descending colon.      A moderate amount of semi-solid stool was found in the entire colon,       interfering with visualization. Lavage of the area was performed,       resulting in clearance with good visualization. However, given the       amount of stool I may have not seen polyps <6 mm, but no large masses or       polyps,or vascular lesions were observed. There was no presence of fresh       blood/hematin or melena.      The retroflexed view of the distal rectum and anal verge was normal and       showed no anal or rectal abnormalities. Impression:               -  One 2 mm polyp in the ascending colon, removed                            with a cold biopsy forceps. Resected and retrieved.                           - Diverticulosis in the sigmoid colon and in the                            descending colon.                           - Stool in the entire examined colon.                           - The distal rectum and anal verge are normal on                            retroflexion  view. Moderate Sedation:      Per Anesthesia Care Recommendation:           - Return patient to hospital ward for ongoing care.                           - Resume previous diet.                           - Await pathology results.                           - Repeat colonoscopy is not recommended due to                            current age (12 years or older) for screening                            purposes. Procedure Code(s):        --- Professional ---                           [phone], Colonoscopy, flexible; with biopsy, single                            or multiple Diagnosis Code(s):        --- Professional ---                           K63.5, Polyp of colon                           D64.9, Anemia, unspecified                           K57.30, Diverticulosis of large intestine without                            perforation or abscess  without bleeding CPT copyright 2019 [employer]. All rights reserved. The codes documented in this report are preliminary and upon coder review may  be revised to meet current compliance requirements. Maylon Peppers, MD Maylon Peppers,  06/13/2020 10:14:49 AM This report has been signed electronically. Number of Addenda: 0

## 2020-06-13 NOTE — Progress Notes (Signed)
We will proceed with EGD and colonoscopy as scheduled.  I thoroughly discussed with the patient his procedure, including the risks involved. Patient understands what the procedure involves including the benefits and any risks. Patient understands alternatives to the proposed procedure. Risks including (but not limited to) bleeding, tearing of the lining (perforation), rupture of adjacent organs, problems with heart and lung function, infection, and medication reactions. A small percentage of complications may require surgery, hospitalization, repeat endoscopic procedure, and/or transfusion.  Patient understood and agreed. ? ?Cathie Bonnell Castaneda, MD ?Gastroenterology and Hepatology ?[facility] ? ?

## 2020-06-13 NOTE — Brief Op Note (Signed)
06/11/2020 - 06/13/2020  10:07 AM  PATIENT:  [name]  87 y.o. male  PRE-OPERATIVE DIAGNOSIS:  iron def anemia  POST-OPERATIVE DIAGNOSIS:  Upper: hiatal, hernia; granulation tissue in the stomach; Grade D esophagitis; LOWER: ascending colon polyp (cold biopsy);diverticulosis;  PROCEDURE:  Procedure(s) with comments: ESOPHAGOGASTRODUODENOSCOPY (EGD) WITH PROPOFOL (N/A) COLONOSCOPY WITH PROPOFOL (N/A) BIOPSY - gastric POLYPECTOMY - ascending,   SURGEON:  Surgeon(s) and Role:    * Harvel Quale, MD - Primary  Patient underwent EGD and colonoscopy under propofol sedation.  Tolerated the procedure adequately.  Esophagogastroduodenospy showed presence of grade D esophagitis above his GE junction without presence of any bleeding, there was no presence of stenosis and the scope easily passed this area.  No dilation was performed.  There was a 2 cm hiatal hernia.  Gastric body showed presence of possible granulation tissue in the posterior area of the gastric body without bleeding, this was biopsied.  Duodenum was normal.  Colonoscopy was performed, there was presence of significant amount of stool throughout the colon which was thoroughly cleaned.  There was significant tortuosity of the colon especially in the right side of the colon.  Scope was advanced to the cecum with the use of abdominal pressure.  A 2 mm polyp was found in the ascending colon which was removed with a cold forceps.  Diverticulosis was found in the descending and sigmoid colon.  Retroflexion was normal.  RECOMMENDATIONS: - Return patient to hospital ward for ongoing care.  - Resume previous diet.  - Await pathology results.  - Continue omeprazole 40 mg every 12 hours PO for 3 months. - Repeat EGD in June as previously scheduled with Dr. Hilarie Fredrickson to assess esophagitis healing. - Repeat colonoscopy is not recommended due to current age (76 years or older) for screening purposes.  - Patient to follow up with  Dr. Hilarie Fredrickson as outpatient and with pulmonologist for hemoptysis - GI service will sign-off, please call us back if you have any more questions.   Maylon Peppers, MD Gastroenterology and Hepatology [facility]

## 2020-06-13 NOTE — Anesthesia Postprocedure Evaluation (Signed)
Anesthesia Post Note  Patient: VIRGLE ARTH  Procedure(s) Performed: ESOPHAGOGASTRODUODENOSCOPY (EGD) WITH PROPOFOL (N/A ) COLONOSCOPY WITH PROPOFOL (N/A ) BIOPSY POLYPECTOMY  Patient location during evaluation: Phase II Anesthesia Type: General Level of consciousness: awake Pain management: pain level controlled Vital Signs Assessment: post-procedure vital signs reviewed and stable Respiratory status: spontaneous breathing and respiratory function stable Cardiovascular status: blood pressure returned to baseline and stable Postop Assessment: no headache and no apparent nausea or vomiting Anesthetic complications: no Comments: Late entry   No complications documented.   Last Vitals:  Vitals:   06/13/20 0845 06/13/20 1007  BP: (!) 109/59 103/60  Pulse: 72 74  Resp: (!) 21 (!) 25  Temp: 36.6 C (!) 36.4 C  SpO2: 93% 93%    Last Pain:  Vitals:   06/13/20 1007  TempSrc:   PainSc: 0-No pain                 Louann Sjogren

## 2020-06-13 NOTE — Anesthesia Preprocedure Evaluation (Signed)
Anesthesia Evaluation  Patient identified by MRN, date of birth, ID band Patient awake    Reviewed: Allergy & Precautions, H&P , NPO status , Patient's Chart, lab work & pertinent test results, reviewed documented beta blocker date and time   Airway Mallampati: II  TM Distance: >3 FB Neck ROM: full    Dental no notable dental hx.    Pulmonary asthma , pneumonia, resolved, COPD,  COPD inhaler,    Pulmonary exam normal breath sounds clear to auscultation       Cardiovascular Exercise Tolerance: Good + CAD, + Cardiac Stents and + Peripheral Vascular Disease   Rhythm:regular Rate:Normal     Neuro/Psych PSYCHIATRIC DISORDERS Anxiety Depression negative neurological ROS     GI/Hepatic Neg liver ROS, hiatal hernia, GERD  Medicated,  Endo/Other  Hypothyroidism   Renal/GU negative Renal ROS  negative genitourinary   Musculoskeletal   Abdominal   Peds  Hematology  (+) Blood dyscrasia, anemia ,   Anesthesia Other Findings   Reproductive/Obstetrics negative OB ROS                             Anesthesia Physical Anesthesia Plan  ASA: III and emergent  Anesthesia Plan: General   Post-op Pain Management:    Induction:   PONV Risk Score and Plan: Propofol infusion  Airway Management Planned:   Additional Equipment:   Intra-op Plan:   Post-operative Plan:   Informed Consent: I have reviewed the patients History and Physical, chart, labs and discussed the procedure including the risks, benefits and alternatives for the proposed anesthesia with the patient or authorized representative who has indicated his/her understanding and acceptance.     Dental Advisory Given  Plan Discussed with: CRNA  Anesthesia Plan Comments:         Anesthesia Quick Evaluation

## 2020-06-13 NOTE — Discharge Summary (Signed)
Discharge Summary  [name] UXN:[id number] DOB: 07-Jul-1932  PCP: Dettinger, [name], MD  Admit date: 06/11/2020 Discharge date: 06/13/2020  Time spent: 35 minutes  Recommendations for Outpatient Follow-up:   -Await pathology results.  -Primary care provider - Repeat EGD in June as previously scheduled with Dr. Hilarie Fredrickson to assess esophagitis healing. - Repeat colonoscopy is not recommended due to current age (57 years or older) for screening purposes.  1. - Patient to follow up with Dr. Hilarie Fredrickson as outpatient and with pulmonologist for hemoptysis   Discharge Diagnoses:  Active Hospital Problems   Diagnosis Date Noted  . Symptomatic anemia 06/11/2020  . CAD (coronary artery disease) 08/10/2016  . CAP (community acquired pneumonia) 06/12/2015  . GI bleed 11/17/2009  . BPH (benign prostatic hyperplasia) 07/17/2008  . COLONIC POLYPS 07/17/2008    Resolved Hospital Problems  No resolved problems to display.    Discharge Condition: Stable  Diet recommendation: Regular  Vitals:   06/13/20 1039 06/13/20 1430  BP: (!) 108/53 (!) 105/50  Pulse: 69 83  Resp: (!) 22 18  Temp: 97.7 F (36.5 C) 97.7 F (36.5 C)  SpO2: 97% 98%    History of present illness:  87 year old male with history of peptic ulcer disease hypothyroidism GERD COPD coronary artery disease and who had just been discharged from the hospital on April 12 but presented to the emergency department on June 12, 2020 with chest pain that was intermittent.  Patient was referred to emergency department for evaluation of symptomatic anemia with his hemoglobin dropping from 10.8-7.6 on admission and a positive Hemoccult in the pulmonologist office.  GI was consulted and he proceeded to have both EGD and colonoscopy with the following findings:Esophagogastroduodenospy showed presence of grade D esophagitis above his GE junction without presence of any bleeding, there was no presence of stenosis and the scope easily passed  this area. No dilation was performed. There was a 2 cm hiatal hernia. Gastric body showed presence of possible granulation tissue in the posterior area of the gastric body without bleeding, this was biopsied. Duodenum was normal. Colonoscopy was performed, there was presence of significant amount of stool throughout the colon which was thoroughly cleaned. There was significant tortuosity of the colon especially in the right side of the colon. Scope was advanced to the cecum with the use of abdominal pressure. A 2 mm polyp was found in the ascending colon which was removed with a cold forceps. Diverticulosis was found in the descending and sigmoid colon. Retroflexion was normal.     Hospital Course:  Principal Problem:   Symptomatic anemia Active Problems:   COLONIC POLYPS   GI bleed   BPH (benign prostatic hyperplasia)   CAP (community acquired pneumonia)   CAD (coronary artery disease) Patient had EGD and colonoscopy today which showed diverticulosis polyp that was removed and esophagitis please see procedure note above  He is in discharged in improved condition he received blood transfusion his current hemoglobin today is 8.8.  He is ready to be discharged home in improved condition Procedures:  EGD June 13, 2020  Colonoscopy, June 13, 2020  Blood transfusion  Consultations:  GI  Discharge Exam: BP (!) 105/50 (BP Location: Left Arm) Comment: Nurse Neasha Notified  Pulse 83   Temp 97.7 F (36.5 C) (Oral)   Resp 18   Ht 5\' 4"  (1.626 m)   Wt 75.5 kg   SpO2 98%   BMI 28.57 kg/m   General: Frail elderly male in no acute distress just  feeling generally tired due to just completed procedures.  Alert oriented Cardiovascular: Regular rate and rhythm Respiratory: No respiratory distress CTA bilaterally Musculoskeletal: Slow  Discharge Instructions You were cared for by a hospitalist during your hospital stay. If you have any questions about your discharge medications or the  care you received while you were in the hospital after you are discharged, you can call the unit and asked to speak with the hospitalist on call if the hospitalist that took care of you is not available. Once you are discharged, your primary care physician will handle any further medical issues. Please note that NO REFILLS for any discharge medications will be authorized once you are discharged, as it is imperative that you return to your primary care physician (or establish a relationship with a primary care physician if you do not have one) for your aftercare needs so that they can reassess your need for medications and monitor your lab values.  Discharge Instructions    Call MD for:  persistant nausea and vomiting   Complete by: As directed    Call MD for:  severe uncontrolled pain   Complete by: As directed    Call MD for:  temperature >100.4   Complete by: As directed    Diet - low sodium heart healthy   Complete by: As directed    Discharge instructions   Complete by: As directed    F/u pathology results.  - Continue omeprazole 40 mg every 12 hours PO for 3 months. - Repeat EGD in June as previously scheduled with Dr. Hilarie Fredrickson to assess esophagitis healing. - Repeat colonoscopy is not recommended due to current age (32 years or older) for screening purposes.  - Patient to follow up with Dr. Hilarie Fredrickson as outpatient and with pulmonologist for hemoptysis   Increase activity slowly   Complete by: As directed      Allergies as of 06/13/2020      Reactions   Betadine [povidone Iodine] Other (See Comments)   Reaction:  Blisters    Gadavist [gadobutrol]    hives   Lortab [hydrocodone-acetaminophen] Nausea And Vomiting   Penicillins Other (See Comments)   Reaction:  Lightheadedness  Has patient had a PCN reaction causing immediate rash, facial/tongue/throat swelling, SOB or lightheadedness with hypotension: Yes Has patient had a PCN reaction causing severe rash involving mucus membranes or  skin necrosis: No Has patient had a PCN reaction that required hospitalization No Has patient had a PCN reaction occurring within the last 10 years: No If all of the above answers are "NO", then may proceed with Cephalosporin use.   Zetia [ezetimibe] Other (See Comments)   Reaction:  Muscle weakness    Zocor [simvastatin] Nausea Only, Other (See Comments)   Reaction:  Muscle weakness       Medication List    STOP taking these medications   terazosin 1 MG capsule Commonly known as: HYTRIN     TAKE these medications   albuterol 108 (90 Base) MCG/ACT inhaler Commonly known as: VENTOLIN HFA Inhale 2 puffs into the lungs every 6 (six) hours as needed for wheezing or shortness of breath.   aspirin EC 81 MG tablet Take 1 tablet (81 mg total) by mouth daily. Swallow whole.   bisacodyl 10 MG suppository Commonly known as: DULCOLAX Place 10 mg rectally as needed for moderate constipation.   bromocriptine 2.5 MG tablet Commonly known as: PARLODEL TAKE  (1)  TABLET TWICE A DAY. What changed: See the new instructions.  cholecalciferol 1000 units tablet Commonly known as: VITAMIN D Take 1,000 Units by mouth daily.   diclofenac Sodium 1 % Gel Commonly known as: VOLTAREN Apply 2 g topically 4 (four) times daily.   doxycycline 100 MG tablet Commonly known as: VIBRA-TABS Take 1 tablet (100 mg total) by mouth 2 (two) times daily for 7 days.   ferrous sulfate 325 (65 FE) MG tablet Take 1 tablet (325 mg total) by mouth daily with breakfast.   Flutter Devi Twice a day and prn as needed, may increase if feeling worse   guaiFENesin 600 MG 12 hr tablet Commonly known as: MUCINEX Take 600 mg by mouth daily.   levalbuterol 0.63 MG/3ML nebulizer solution Commonly known as: Xopenex Take 3 mLs (0.63 mg total) by nebulization every 6 (six) hours as needed for wheezing or shortness of breath.   levothyroxine 75 MCG tablet Commonly known as: SYNTHROID Take 1 tablet (75 mcg total) by  mouth daily.   magnesium oxide 400 (241.3 Mg) MG tablet Commonly known as: MAG-OX Take 1 tablet (400 mg total) by mouth 2 (two) times daily.   naphazoline-pheniramine 0.025-0.3 % ophthalmic solution Commonly known as: NAPHCON-A Place 1 drop into both eyes 2 (two) times daily as needed for irritation or allergies.   nitroGLYCERIN 0.4 MG SL tablet Commonly known as: NITROSTAT Place 1 tablet (0.4 mg total) under the tongue every 5 (five) minutes as needed for chest pain.   pantoprazole 40 MG tablet Commonly known as: PROTONIX Take 1 tablet (40 mg total) by mouth 2 (two) times daily.   sucralfate 1 g tablet Commonly known as: CARAFATE Take 1 tablet three times daily as needed What changed:   how much to take  how to take this  when to take this  reasons to take this  additional instructions   Symbicort 160-4.5 MCG/ACT inhaler Generic drug: budesonide-formoterol INHALE 2 PUFFS 2 TIMES A DAY What changed: See the new instructions.   tamsulosin 0.4 MG Caps capsule Commonly known as: FLOMAX Take 1 capsule (0.4 mg total) by mouth daily after supper.   triamcinolone cream 0.1 % Commonly known as: KENALOG Apply 1 application topically 2 (two) times daily.   vitamin B-12 1000 MCG tablet Commonly known as: CYANOCOBALAMIN Take 1,000 mcg by mouth daily.   vitamin E 180 MG (400 UNITS) capsule Take 400 Units by mouth daily.      Allergies  Allergen Reactions  . Betadine [Povidone Iodine] Other (See Comments)    Reaction:  Blisters   . Gadavist [Gadobutrol]     hives  . Lortab [Hydrocodone-Acetaminophen] Nausea And Vomiting  . Penicillins Other (See Comments)    Reaction:  Lightheadedness  Has patient had a PCN reaction causing immediate rash, facial/tongue/throat swelling, SOB or lightheadedness with hypotension: Yes Has patient had a PCN reaction causing severe rash involving mucus membranes or skin necrosis: No Has patient had a PCN reaction that required  hospitalization No Has patient had a PCN reaction occurring within the last 10 years: No If all of the above answers are "NO", then may proceed with Cephalosporin use.  Marland Kitchen Zetia [Ezetimibe] Other (See Comments)    Reaction:  Muscle weakness   . Zocor [Simvastatin] Nausea Only and Other (See Comments)    Reaction:  Muscle weakness       The results of significant diagnostics from this hospitalization (including imaging, microbiology, ancillary and laboratory) are listed below for reference.    Significant Diagnostic Studies: DG Chest 2 View  Result Date: 06/11/2020  CLINICAL DATA:  Cough with chest pain and hemoptysis. EXAM: CHEST - 2 VIEW COMPARISON:  June 08, 2020 FINDINGS: Mild, chronic appearing increased lung markings are seen with mild areas of atelectasis and/or early infiltrate seen within the bilateral lung bases. There is no evidence of a pleural effusion or pneumothorax. The heart size and mediastinal contours are within normal limits. Moderate to marked severity calcification of the aortic arch is noted. Chronic fifth and sixth left rib fractures are seen. IMPRESSION: Chronic appearing increased lung markings with mild bibasilar atelectasis and/or early infiltrate. Electronically Signed   By: Virgina Norfolk M.D.   On: 06/11/2020 22:09   DG Chest 2 View  Result Date: 06/08/2020 CLINICAL DATA:  Hemoptysis EXAM: CHEST - 2 VIEW COMPARISON:  05/11/2020 FINDINGS: The heart size and mediastinal contours are within normal limits. No acute airspace opacity. A previously noted rounded density of the infrahilar right lung is not well appreciated on current examination. Redemonstrated focal eventration of the left hemidiaphragm. The visualized skeletal structures are unremarkable. IMPRESSION: 1.  No acute abnormality of the lungs. 2. A previously noted rounded density of the infrahilar right lung is not well appreciated on current examination. CT remains as previously recommended to exclude  malignancy. Electronically Signed   By: Eddie Candle M.D.   On: 06/08/2020 16:21   CT Chest W Contrast  Result Date: 06/08/2020 CLINICAL DATA:  Hemoptysis since last night. EXAM: CT CHEST WITH CONTRAST TECHNIQUE: Multidetector CT imaging of the chest was performed during intravenous contrast administration. CONTRAST:  95mL OMNIPAQUE IOHEXOL 300 MG/ML  SOLN COMPARISON:  CT chest dated December 26, 2018. FINDINGS: Cardiovascular: Normal heart size. No pericardial effusion. Unchanged ascending thoracic aortic aneurysm measuring up to 4.4 cm. No dissection. Coronary, aortic arch, and branch vessel atherosclerotic vascular disease. Mediastinum/Nodes: No enlarged mediastinal, hilar, or axillary lymph nodes. Thyroid gland, trachea, and esophagus demonstrate no significant findings. Lungs/Pleura: Unchanged chronic debris and mucoid impaction in both lower lobes, worse on the right. Similar appearing scarring in both lungs, worse in the right lower lobe. No focal consolidation, pleural effusion, or pneumothorax. Bilateral pleural calcifications again noted. Upper Abdomen: No acute abnormality. Postsurgical changes at the GE junction with unchanged small hiatal hernia. Presumably mixing artifact in the portal vein. Musculoskeletal: No chest wall abnormality. No acute or significant osseous findings. Multiple chronic thoracic compression deformities and old left rib fractures again noted. IMPRESSION: 1. No acute intrathoracic process. 2. Unchanged chronic debris and mucoid impaction in both lower lobes, worse on the right. 3. Unchanged 4.4 cm ascending thoracic aortic aneurysm. Recommend annual imaging followup by CTA or MRA. This recommendation follows 2010 ACCF/AHA/AATS/ACR/ASA/SCA/SCAI/SIR/STS/SVM Guidelines for the Diagnosis and Management of Patients with Thoracic Aortic Disease. Circulation. 2010; 121: W263-Z858. Aortic aneurysm NOS (ICD10-I71.9) 4. Aortic Atherosclerosis (ICD10-I70.0). Electronically Signed   By:  Titus Dubin M.D.   On: 06/08/2020 18:17   CT ANGIO CHEST PE W OR WO CONTRAST  Result Date: 06/09/2020 CLINICAL DATA:  Elevated D-dimer. Tachycardia, cough, hemoptysis, shortness of breath EXAM: CT ANGIOGRAPHY CHEST WITH CONTRAST TECHNIQUE: Multidetector CT imaging of the chest was performed using the standard protocol during bolus administration of intravenous contrast. Multiplanar CT image reconstructions and MIPs were obtained to evaluate the vascular anatomy. CONTRAST:  80mL OMNIPAQUE IOHEXOL 350 MG/ML SOLN COMPARISON:  06/08/2020 FINDINGS: Cardiovascular: No filling defects in the pulmonary arteries to suggest pulmonary emboli. 4.3 cm ascending thoracic aortic aneurysm. Aortic calcifications in the arch and descending thoracic aorta. Extensive coronary artery calcifications most pronounced in  the left coronary arteries. Mediastinum/Nodes: No mediastinal, hilar, or axillary adenopathy. Trachea and esophagus are unremarkable. Thyroid unremarkable. Lungs/Pleura: Chronic debris/mucoid impaction in the lower lobe bronchi, right greater than left. Stable chronic elevation of the left hemidiaphragm. Bibasilar atelectasis. No effusions. Calcified pleural plaques bilaterally. Upper Abdomen: Imaging into the upper abdomen demonstrates no acute findings. Musculoskeletal: Chest wall soft tissues are unremarkable. No acute bony abnormality. Review of the MIP images confirms the above findings. IMPRESSION: No evidence of pulmonary embolus. Coronary artery disease. Chronic mucoid impaction in the lower lobe bronchi with bibasilar atelectasis. Stable chronic elevation of the left hemidiaphragm. 4.3 cm ascending thoracic aortic aneurysm. Recommend annual imaging followup by CTA or MRA. This recommendation follows 2010 ACCF/AHA/AATS/ACR/ASA/SCA/SCAI/SIR/STS/SVM Guidelines for the Diagnosis and Management of Patients with Thoracic Aortic Disease. Circulation. 2010; 121: M426-S341. Aortic aneurysm NOS (ICD10-I71.9) Aortic  Atherosclerosis (ICD10-I70.0). Electronically Signed   By: Rolm Baptise M.D.   On: 06/09/2020 11:43    Microbiology: Recent Results (from the past 240 hour(s))  Resp Panel by RT-PCR (Flu A&B, Covid) Nasopharyngeal Swab     Status: None   Collection Time: 06/08/20  8:47 PM   Specimen: Nasopharyngeal Swab; Nasopharyngeal(NP) swabs in vial transport medium  Result Value Ref Range Status   SARS Coronavirus 2 by RT PCR NEGATIVE NEGATIVE Final    Comment: (NOTE) SARS-CoV-2 target nucleic acids are NOT DETECTED.  The SARS-CoV-2 RNA is generally detectable in upper respiratory specimens during the acute phase of infection. The lowest concentration of SARS-CoV-2 viral copies this assay can detect is 138 copies/mL. A negative result does not preclude SARS-Cov-2 infection and should not be used as the sole basis for treatment or other patient management decisions. A negative result may occur with  improper specimen collection/handling, submission of specimen other than nasopharyngeal swab, presence of viral mutation(s) within the areas targeted by this assay, and inadequate number of viral copies(<138 copies/mL). A negative result must be combined with clinical observations, patient history, and epidemiological information. The expected result is Negative.  Fact Sheet for Patients:  EntrepreneurPulse.com.au  Fact Sheet for Healthcare Providers:  IncredibleEmployment.be  This test is no t yet approved or cleared by the [place] FDA and  has been authorized for detection and/or diagnosis of SARS-CoV-2 by FDA under an Emergency Use Authorization (EUA). This EUA will remain  in effect (meaning this test can be used) for the duration of the COVID-19 declaration under Section 564(b)(1) of the Act, 21 U.S.C.section 360bbb-3(b)(1), unless the authorization is terminated  or revoked sooner.       Influenza A by PCR NEGATIVE NEGATIVE Final   Influenza B  by PCR NEGATIVE NEGATIVE Final    Comment: (NOTE) The Xpert Xpress SARS-CoV-2/FLU/RSV plus assay is intended as an aid in the diagnosis of influenza from Nasopharyngeal swab specimens and should not be used as a sole basis for treatment. Nasal washings and aspirates are unacceptable for Xpert Xpress SARS-CoV-2/FLU/RSV testing.  Fact Sheet for Patients: EntrepreneurPulse.com.au  Fact Sheet for Healthcare Providers: IncredibleEmployment.be  This test is not yet approved or cleared by the [place] FDA and has been authorized for detection and/or diagnosis of SARS-CoV-2 by FDA under an Emergency Use Authorization (EUA). This EUA will remain in effect (meaning this test can be used) for the duration of the COVID-19 declaration under Section 564(b)(1) of the Act, 21 U.S.C. section 360bbb-3(b)(1), unless the authorization is terminated or revoked.  Performed at [facility], [address] [place], [place] [address]   Resp Panel by  RT-PCR (Flu A&B, Covid) Nasopharyngeal Swab     Status: None   Collection Time: 06/11/20 11:20 PM   Specimen: Nasopharyngeal Swab; Nasopharyngeal(NP) swabs in vial transport medium  Result Value Ref Range Status   SARS Coronavirus 2 by RT PCR NEGATIVE NEGATIVE Final    Comment: (NOTE) SARS-CoV-2 target nucleic acids are NOT DETECTED.  The SARS-CoV-2 RNA is generally detectable in upper respiratory specimens during the acute phase of infection. The lowest concentration of SARS-CoV-2 viral copies this assay can detect is 138 copies/mL. A negative result does not preclude SARS-Cov-2 infection and should not be used as the sole basis for treatment or other patient management decisions. A negative result may occur with  improper specimen collection/handling, submission of specimen other than nasopharyngeal swab, presence of viral mutation(s) within the areas targeted by this assay, and inadequate number of  viral copies(<138 copies/mL). A negative result must be combined with clinical observations, patient history, and epidemiological information. The expected result is Negative.  Fact Sheet for Patients:  EntrepreneurPulse.com.au  Fact Sheet for Healthcare Providers:  IncredibleEmployment.be  This test is no t yet approved or cleared by the [place] FDA and  has been authorized for detection and/or diagnosis of SARS-CoV-2 by FDA under an Emergency Use Authorization (EUA). This EUA will remain  in effect (meaning this test can be used) for the duration of the COVID-19 declaration under Section 564(b)(1) of the Act, 21 U.S.C.section 360bbb-3(b)(1), unless the authorization is terminated  or revoked sooner.       Influenza A by PCR NEGATIVE NEGATIVE Final   Influenza B by PCR NEGATIVE NEGATIVE Final    Comment: (NOTE) The Xpert Xpress SARS-CoV-2/FLU/RSV plus assay is intended as an aid in the diagnosis of influenza from Nasopharyngeal swab specimens and should not be used as a sole basis for treatment. Nasal washings and aspirates are unacceptable for Xpert Xpress SARS-CoV-2/FLU/RSV testing.  Fact Sheet for Patients: EntrepreneurPulse.com.au  Fact Sheet for Healthcare Providers: IncredibleEmployment.be  This test is not yet approved or cleared by the [place] FDA and has been authorized for detection and/or diagnosis of SARS-CoV-2 by FDA under an Emergency Use Authorization (EUA). This EUA will remain in effect (meaning this test can be used) for the duration of the COVID-19 declaration under Section 564(b)(1) of the Act, 21 U.S.C. section 360bbb-3(b)(1), unless the authorization is terminated or revoked.  Performed at [facility], [address] [place], [place] [address]      Labs: Basic Metabolic Panel: Recent Labs  Lab 06/09/20 0510 06/10/20 1443 06/11/20 2135 06/12/20 0501  06/13/20 0513  NA 141 138 135 137 137  K 4.3 4.1 3.8 3.8 3.7  CL 110 107 106 107 107  CO2 24 25 21* 22 23  GLUCOSE 86 97 101* 98 90  BUN 44* 24* 23 19 13   CREATININE 0.88 1.03 0.99 0.94 1.01  CALCIUM 8.0* 9.0 8.3* 8.2* 7.9*  MG 1.8  --   --  1.7  --   PHOS 2.8  --   --   --   --    Liver Function Tests: Recent Labs  Lab 06/09/20 0510 06/12/20 0501  AST 18 25  ALT 16 19  ALKPHOS 41 53  BILITOT 0.3 0.8  PROT 5.0* 5.3*  ALBUMIN 2.8* 2.9*   No results for input(s): LIPASE, AMYLASE in the last 168 hours. No results for input(s): AMMONIA in the last 168 hours. CBC: Recent Labs  Lab 06/08/20 1359 06/09/20 0823 06/10/20 1443 06/11/20 2135 06/12/20  0501 06/13/20 0513 06/13/20 1511  WBC 10.0 9.9  --  15.7* 17.2* 11.3*  --   NEUTROABS 7.2  --   --  12.0*  --   --   --   HGB 10.8* 8.6* 8.6 Repeated and verified X2.* 7.6* 7.2* 8.1* 8.8*  HCT 33.6* 26.3* 25.3 Repeated and verified X2.* 23.2* 22.2* 25.0* 27.4*  MCV 101.2* 100.0  --  101.3* 100.5* 98.8  --   PLT 225 199  --  195 206 208  --    Cardiac Enzymes: No results for input(s): CKTOTAL, CKMB, CKMBINDEX, TROPONINI in the last 168 hours. BNP: BNP (last 3 results) No results for input(s): BNP in the last 8760 hours.  ProBNP (last 3 results) No results for input(s): PROBNP in the last 8760 hours.  CBG: Recent Labs  Lab 06/13/20 0011 06/13/20 0530 06/13/20 1100 06/13/20 1548  GLUCAP 105* 85 95 129*       Signed:  Cristal Deer, MD [facility] 06/13/2020, 4:54 PM

## 2020-06-15 ENCOUNTER — Encounter (HOSPITAL_COMMUNITY): Payer: Self-pay | Admitting: Gastroenterology

## 2020-06-15 ENCOUNTER — Telehealth: Payer: Self-pay | Admitting: Nurse Practitioner

## 2020-06-15 ENCOUNTER — Ambulatory Visit: Payer: Medicare Other | Admitting: Family Medicine

## 2020-06-15 ENCOUNTER — Other Ambulatory Visit: Payer: Self-pay | Admitting: Endocrinology

## 2020-06-15 DIAGNOSIS — D649 Anemia, unspecified: Secondary | ICD-10-CM

## 2020-06-15 DIAGNOSIS — E221 Hyperprolactinemia: Secondary | ICD-10-CM

## 2020-06-15 LAB — TYPE AND SCREEN
ABO/RH(D): O POS
Antibody Screen: POSITIVE
Donor AG Type: NEGATIVE
Donor AG Type: NEGATIVE
Unit division: 0
Unit division: 0

## 2020-06-15 LAB — BPAM RBC
Blood Product Expiration Date: 202204292359
ISSUE DATE / TIME: 202204150939
Unit Type and Rh: 5100

## 2020-06-15 NOTE — Telephone Encounter (Signed)
Dr. Norman Herrlich, as you know this is the very nice patient I called you about on 06/10/2020 while the patient was in the GI clinic. At that time, we discussed pulmonary clearance prior to EGD and PPI was increased to bid. Since then, he was admitted to [facility] 06/13/2020 and he underwent an EGD and colonoscopy in setting of hemoptysis. Pls review his EGD which  identified LA Grade D reflux esophagitis with no bleeding. - 2 cm hiatal hernia. - Granular gastric mucosa. Biopsies pending. A colonoscopy was also done. See report in Epic. Do you want patient patient to proceed with the EGD 08/03/2020 with you to ensure esophagitis healing?

## 2020-06-15 NOTE — Telephone Encounter (Signed)
Inbound call from patient stating he is scheduled to have a procedure with Dr. Tonye Royalty on 08/03/20 but wanted to make [place] aware he was admitted to [facility] on 06/11/20 and had his procedure then.

## 2020-06-15 NOTE — Telephone Encounter (Signed)
Please review the patient's procedure note and discharge summary. Recommendation is to repeat the EGD in June as scheduled. Okay with you?

## 2020-06-16 LAB — SURGICAL PATHOLOGY

## 2020-06-16 NOTE — Telephone Encounter (Signed)
Colleen Thanks for your message I think he needs to stay on the BID pantoprazole and use the liquid carafate or carafate slurry TID-AC and HS for 2-4 weeks (after that time he can change to PRN). I am not sure repeat EGD given his age is necessary -- this is not a new issue for him.   Why Hazael't we leave the procedure in place for now but see him in late May to reassess and at that point we can determine appropriateness of repeat EGD Thanks JMP

## 2020-06-16 NOTE — Progress Notes (Signed)
Addendum: Reviewed and agree with assessment and management plan. Lakisa Lotz M, MD  

## 2020-06-16 NOTE — Telephone Encounter (Signed)
I have spoken to patient to advise of Dr Vena Rua recommendation to stay on twice daily pantoprazole as well as carafate three times daily before meals and at bedtime x 2-4 weeks, then as needed. I also advised that we would like to see him in follow up on 07/23/20 at 1030 am and at that point decide if he needs endoscopy. Patient verbalizes understanding of this information.

## 2020-06-17 ENCOUNTER — Ambulatory Visit: Payer: Medicare Other | Admitting: Pulmonary Disease

## 2020-06-17 NOTE — Addendum Note (Signed)
Addended by: Larina Bras on: 06/17/2020 08:45 AM   Modules accepted: Orders

## 2020-06-17 NOTE — Telephone Encounter (Signed)
Dotty, thank you for contacting the patient regarding follow up with Dr. Hilarie Fredrickson. Pls contact the patient and send him to the lab to have a repeat CBC done in 2 weeks to ensure his anemia is improving. Thank you.

## 2020-06-17 NOTE — Telephone Encounter (Signed)
I have spoken to patient to advise that he needs CBC 07/01/20 to ensure his hemoglobin is improving. He verbalizes understanding. Orders entered in [place].

## 2020-06-18 ENCOUNTER — Ambulatory Visit: Payer: Medicare Other | Admitting: Pulmonary Disease

## 2020-06-18 ENCOUNTER — Other Ambulatory Visit: Payer: Self-pay

## 2020-06-18 ENCOUNTER — Telehealth: Payer: Self-pay

## 2020-06-18 ENCOUNTER — Encounter: Payer: Self-pay | Admitting: Pulmonary Disease

## 2020-06-18 VITALS — BP 102/60 | HR 83 | Temp 98.1°F | Ht 64.0 in | Wt 167.4 lb

## 2020-06-18 DIAGNOSIS — J471 Bronchiectasis with (acute) exacerbation: Secondary | ICD-10-CM | POA: Diagnosis not present

## 2020-06-18 DIAGNOSIS — J449 Chronic obstructive pulmonary disease, unspecified: Secondary | ICD-10-CM

## 2020-06-18 MED ORDER — SODIUM CHLORIDE 3 % IN NEBU: INHALATION_SOLUTION | RESPIRATORY_TRACT | 12 refills | Status: DC | PRN

## 2020-06-18 MED ORDER — BUDESONIDE-FORMOTEROL FUMARATE 160-4.5 MCG/ACT IN AERO: 2.0000 | INHALATION_SPRAY | Freq: Two times a day (BID) | RESPIRATORY_TRACT | 6 refills | Status: DC

## 2020-06-18 NOTE — Telephone Encounter (Signed)
Will await Dr. Cordelia Pen response.

## 2020-06-18 NOTE — Telephone Encounter (Signed)
Patient was seen by Dr. Loanne Drilling today and in December and would like to switch to her.  Dr. Melvyn Novas please advise if you are ok with this switch?  Dr. Loanne Drilling please advise if you are ok with this switch?

## 2020-06-18 NOTE — Patient Instructions (Addendum)
Bronchiectasis/Hemoptysis --Finish doxycyline course for 7 days.  --Continue Symbicort TWO puffs TWICE a day --Start hypertonic saline ONCE a day as needed. Will arrange for nebulizer  Esophagitis/Barretts --Discussed reflux diet.  --Continue protonoxi twice a day --Continue carafate three times daily as needed  Encourage regular aerobic exercise and weight training in moderation    Food Choices for Gastroesophageal Reflux Disease, Adult When you have gastroesophageal reflux disease (GERD), the foods you eat and your eating habits are very important. Choosing the right foods can help ease your discomfort. Think about working with a food expert (dietitian) to help you make good choices. What are tips for following this plan? Reading food labels  Look for foods that are low in saturated fat. Foods that may help with your symptoms include: ? Foods that have less than 5% of daily value (DV) of fat. ? Foods that have 0 grams of trans fat. Cooking  Do not fry your food.  Cook your food by baking, steaming, grilling, or broiling. These are all methods that do not need a lot of fat for cooking.  To add flavor, try to use herbs that are low in spice and acidity. Meal planning  Choose healthy foods that are low in fat, such as: ? Fruits and vegetables. ? Whole grains. ? Low-fat dairy products. ? Lean meats, fish, and poultry.  Eat small meals often instead of eating 3 large meals each day. Eat your meals slowly in a place where you are relaxed. Avoid bending over or lying down until 2-3 hours after eating.  Limit high-fat foods such as fatty meats or fried foods.  Limit your intake of fatty foods, such as oils, butter, and shortening.  Avoid the following as told by your doctor: ? Foods that cause symptoms. These may be different for different people. Keep a food diary to keep track of foods that cause symptoms. ? Alcohol. ? Drinking a lot of liquid with meals. ? Eating meals  during the 2-3 hours before bed.   Lifestyle  Stay at a healthy weight. Ask your doctor what weight is healthy for you. If you need to lose weight, work with your doctor to do so safely.  Exercise for at least 30 minutes on 5 or more days each week, or as told by your doctor.  Wear loose-fitting clothes.  Do not smoke or use any products that contain nicotine or tobacco. If you need help quitting, ask your doctor.  Sleep with the head of your bed higher than your feet. Use a wedge under the mattress or blocks under the bed frame to raise the head of the bed.  Chew sugar-free gum after meals. What foods should eat? Eat a healthy, well-balanced diet of fruits, vegetables, whole grains, low-fat dairy products, lean meats, fish, and poultry. Each person is different. Foods that may cause symptoms in one person may not cause any symptoms in another person. Work with your doctor to find foods that are safe for you. The items listed above may not be a complete list of what you can eat and drink. Contact a food expert for more options.   What foods should I avoid? Limiting some of these foods may help in managing the symptoms of GERD. Everyone is different. Talk with a food expert or your doctor to help you find the exact foods to avoid, if any. Fruits Any fruits prepared with added fat. Any fruits that cause symptoms. For some people, this may include citrus fruits, such as  oranges, grapefruit, pineapple, and lemons. Vegetables Deep-fried vegetables. [place] fries. Any vegetables prepared with added fat. Any vegetables that cause symptoms. For some people, this may include tomatoes and tomato products, chili peppers, onions and garlic, and horseradish. Grains Pastries or quick breads with added fat. Meats and other proteins High-fat meats, such as fatty beef or pork, hot dogs, ribs, ham, sausage, salami, and bacon. Fried meat or protein, including fried fish and fried chicken. Nuts and nut  butters, in large amounts. Dairy Whole milk and chocolate milk. Sour cream. Cream. Ice cream. Cream cheese. Milkshakes. Fats and oils Butter. Margarine. Shortening. Ghee. Beverages Coffee and tea, with or without caffeine. Carbonated beverages. Sodas. Energy drinks. Fruit juice made with acidic fruits, such as orange or grapefruit. Tomato juice. Alcoholic drinks. Sweets and desserts Chocolate and cocoa. Donuts. Seasonings and condiments Pepper. Peppermint and spearmint. Added salt. Any condiments, herbs, or seasonings that cause symptoms. For some people, this may include curry, hot sauce, or vinegar-based salad dressings. The items listed above may not be a complete list of what you should not eat and drink. Contact a food expert for more options. Questions to ask your doctor Diet and lifestyle changes are often the first steps that are taken to manage symptoms of GERD. If diet and lifestyle changes do not help, talk with your doctor about taking medicines. Where to find more information  International Foundation for Gastrointestinal Disorders: aboutgerd.org Summary  When you have GERD, food and lifestyle choices are very important in easing your symptoms.  Eat small meals often instead of 3 large meals a day. Eat your meals slowly and in a place where you are relaxed.  Avoid bending over or lying down until 2-3 hours after eating.  Limit high-fat foods such as fatty meats or fried foods. This information is not intended to replace advice given to you by your health care provider. Make sure you discuss any questions you have with your health care provider. Document Revised: 08/26/2019 Document Reviewed: 08/26/2019 Elsevier Patient Education  [place].

## 2020-06-18 NOTE — Progress Notes (Signed)
Subjective:   Patient ID: [name], male    DOB: [birth year]   MRN: [id number]    Brief patient profile:  87   yowm quit smoking completely around [name] (relatively light) with cough and short of breath eval by  Dr Eliberto Ivory  Then [name] dx of copd/ cb  pft's showed fev1 116% 02/2010 though ratio 66 c/w GOLD I criteria with confirmed bronchiectasis in 11/2017      History of Present Illness  07/05/2010 ov cc recurrent pna's since June 2011 cc not back to baseline in terms of activities he enjoyed in May, for example  Could do some yardwork  and rarely  Needed saba and no need for any maint rx,   but on  spriva for sob and still frequent tightness generalized front more than back,  Equal both sides  low grade fever and mucus gets thick and yellow> admit to [facility] April 24-27 by [facility] dx of ? Pna.   No sob at rest.   Continue protonix 40 mg  Take 30-60 min before first meal of the day and Pepcid 20 mg at bedtime GERD (REFLUX) diet Try symbicort 160 Take 2 puffs bid and work on hfa technique     01/31/2017  f/u ov/Wert re:  Copd 1/ chronic bronchitis / ? Prev aspiraton with abnormal cT chest  ? obst RLL  Chief Complaint  Patient presents with  . Follow-up    Pt states he has had "a touch of PNA". He has been coughing since the beginning on Nov 2018.  He has taken doxy and levaquin.  He is coughing up greyish white sputum.  He has an albuterol inhaler that he rarely uses.   swallowing better now but still very congested cough esp since early nov 2018  Not using flutter valve as rec  hfa has improved but not yet adequate Still some hb/ sore throat symptoms despite reporting ppi bid  Also still some noct cough sev hours p lie down despite using 6 inch blocks rec FOB 02/01/17> tenacious secretions> nl flora/ no tb, no fungus on culture    04/28/2017  f/u ov/Wert re: f/u COPD / cb abn ct again Chief Complaint  Patient presents with  . Acute Visit    Increased cough x 10 days- prod with white to  grey sputum. He started on Doxy per Dr Laurance Flatten- has one dose remaining. He states his breathing is unchanged since his last visit. He rarely uses his albuterol inhaler.   Had CT Chest done 04/26/17.    did better p first few days p fob until the end Dec nasty mucus, more sob > doxy then added zpak then better again then worse again last week if Feb 2019 restarted doxy and now on day #  9/10 on  ct chest 04/26/17 obst again on w/u for TAA Doe = MMRC1 = can walk nl pace, flat grade, can't hurry or go uphills or steps s sob   Sleeps ok but quit a bit of am congestion /mucus still grey   rec Please see patient coordinator before you leave today  to schedule sinus CT> neg  For nasty mucus >  zpak  Prednisone 10 mg take  4 each am x 2 days,   2 each am x 2 days,  1 each am x 2 days and stop  Work on inhaler technique:       12/25/2017 acute ov/Wert re: bronchiectasis flare, last levaquin was 12/20/17  / maint on  smyb 160 Chief Complaint  Patient presents with  . Acute Visit    started running fever 12/24/17. He also has had chest congestion and cough with dark brown sputum. He rarely uses his albuterol inhaler or neb.   Dyspnea:  MMRC3 = can't walk 100 yards even at a slow pace at a flat grade s stopping due to sob   Cough: worse one hour p hs with lots of yellow mucus esp in am / assoc overt HB Sleeping: 6 inch hob  SABA use: only once a day rec Sputum culture: nl flora/ neg afb smear  Mucinex is 1200 mg every 12  hours with flutter valve as much as possible  HRCT 11/3017 c/w bronchiectasis  VEST rec   02/05/2018  f/u ov/Wert re: copd 1, bronchiectasis maint on symb 160 2bid/flutter/vest  Chief Complaint  Patient presents with  . Follow-up   Dyspnea:  No change = mmrc3 Cough: better with approp use of flutter, no better with vest  Sleeping: 6 in blocks, some worse cough / congestion in am but not noct  SABA use: min rec Order was sent to DME company to discontinue your VEST        03/28/2018  f/u ov/Wert re: copd 1 with bronchiectasis/ maint symb 160 2bid Chief Complaint  Patient presents with  . Follow-up    Pt has productive cough-white, chest tightness, SOB with exertion.    Dyspnea:  MMRC2 = can't walk a nl pace on a flat grade s sob but does fine slow and flat ok at food liong Cough variably purulent up to a half a cup in 24 h, fltter helps  Sleeping: 6 in bed blocks/ pillows x one  SABA use: varies  02: not using  Rec If mucus turns nasty/bloody > cipro 500 mg twice daily x 7 days (refillable) Please remember to go to the lab department   for your tests - we will call you with the results when they are available.    08/02/2018  f/u ov/Wert re: bronchiectasis / GOLD I copd  Chief Complaint  Patient presents with  . Follow-up    Cough with grey/green sputum, sometimes wakes in the night coughing. Last round of cipro was in Jan 2020. He is using his inhaler 2 x per wk on average.   Dyspnea:  Able to walk a quarter mile with dog some hills Cough: worse 2-3 am / uses flutter and back to bed also during the day  Sleeping: bed is flat/ right side down/ wedge SABA use: as above  02: none  rec Change cipro to 500 mg twice daily x 10 day cycles as needed for nasty mucus  02/17/20      Chief Complaint  Patient presents with  . Follow-up    Pt stated that he is having to use the flutter valve more and his secretions are more of a darker brown for the last 2-3 weeks.  Wanted to see if he could do another cycle of abx.  Discuss the covid booster vaccine.   He is compliant with his Symbicort and flutter valve 2-4x daily with thick sputum production. Has had increased sputum production. Sputum is dark brown but no green sputum. Reports fatigue. Denies fever, chills. Reports single episode of dizziness, nausea and retrosternal pain that took a few minutes to resolve.He has not had episodes like this in the past. Shortness of breath with exertion  unchanged.  02/17/20  Chief Complaint  Patient presents with  .  Follow-up    Patient was recently admitted into the hospital, got out Saturday. Patient feel much better now. States his hemoglobin was really low.    [name] is a 87 year old male with bronchiectasis and COPD who presents for follow-up.  On our last visit, he was treated with levaquin for bronchiectasis exacerbation. He did well after completing antibiotics. However he developed by hemoptysis and was admitted to the hospital. EGD demonstrated esophagitis. He is currently on doxycycline which causes him some nausea. He has had no further episodes of hemoptysis. He has been compliant with his flutter valve. He reports he is feeling more short of breath. Chronic sputum production in am that is unchanged.  Review of Systems  Constitutional: Negative for chills, diaphoresis, fever, malaise/fatigue and weight loss.  HENT: Negative for congestion.   Respiratory: Positive for cough, sputum production, shortness of breath and wheezing. Negative for hemoptysis.   Cardiovascular: Negative for chest pain, palpitations and leg swelling.     Physical Exam: General: Well-appearing, no acute distress HENT: [place], AT Eyes: EOMI, no scleral icterus Respiratory: Clear to auscultation bilaterally.  No crackles, wheezing or rales Cardiovascular: RRR, -M/R/G, no JVD Extremities:-Edema,-tenderness Neuro: AAO x4, CNII-XII grossly intact Skin: Intact, no rashes or bruising Psych: Normal mood, normal affect  CT Chest 06/08/20 - unchanged chronic debris and mucoid impaction. Unchanged bibasilar scarring. Unchanged ascending thoracic aortic aneurysm CTA 06/09/20 - no pulmonary embolism. Similar changes as noted in CT Chest 06/08/20 Imaging, labs and test noted above have been reviewed independently by me.  Assessment/Plan  Bronchiectasis Exacerbation/hemoptysis  --Finish doxycyline course for 7 days.  --Continue Symbicort TWO puffs TWICE  a day --Start hypertonic saline ONCE a day as needed. Will arrange for nebulizer  Esophagitis/Barretts --Discussed reflux diet.  --Continue protononix twice a day --Continue carafate three times daily as needed  Encourage regular aerobic exercise and weight training in moderation  Follow-up in 5 months  I have spent a total time of 35-minutes on the day of the appointment reviewing prior documentation, coordinating care and discussing medical diagnosis and plan with the patient/family. Imaging, labs and tests included in this note have been reviewed and interpreted independently by me.  Rodman Pickle, M.D. [facility] 06/18/2020 10:39 AM    I have personally reviewed patient's past medical/family/social history/allergies/current medications.  Patient Active Problem List   Diagnosis Date Noted  . Symptomatic anemia 06/11/2020  . Barrett's esophagus 06/09/2020  . Hemoptysis 06/08/2020  . Acquired dilation of ascending aorta and aortic root ([place]) 05/20/2020  . Chest congestion 05/11/2020  . Metatarsalgia of right foot 08/27/2019  . Memory disorder 05/03/2019  . Chronic constipation 10/17/2018  . Peripheral vascular insufficiency ([place]) 07/19/2017  . Aneurysmal dilatation ([place]) 07/19/2017  . Aortic atherosclerosis ([place]) 11/21/2016  . Thoracic ascending aortic aneurysm ([place]) 08/10/2016  . CAD (coronary artery disease) 08/10/2016  . Hiatal hernia 05/04/2016  . Esophageal stricture   . Afib ([place]) 06/12/2015  . CAP (community acquired pneumonia) 06/12/2015  . Postoperative anemia due to acute blood loss 06/11/2015  . Neuropathy 12/31/2013  . Vitamin B 12 deficiency 12/31/2013  . History of esophageal ulcer 04/20/2013  . S/P left and ritght THA, AA 07/21/2011  . Special screening for malignant neoplasms, colon 01/03/2011  . Personal history of colonic polyps 01/03/2011  . COPD (chronic obstructive pulmonary disease) ([place]) 08/24/2010  . GI bleed  11/17/2009  . COLONIC POLYPS 07/17/2008  . Macrocytic anemia 07/17/2008  . Gastroesophageal reflux disease with esophagitis  07/17/2008  . DEGENERATIVE JOINT DISEASE 07/17/2008  . Osteoporosis 07/17/2008  . BPH (benign prostatic hyperplasia) 07/17/2008  . Hyperprolactinemia ([place]) 10/24/2007  . PITUITARY ADENOMA 06/25/2007  . Hypothyroidism 06/25/2007  . Hyperlipidemia LDL goal <70 06/22/2007  . Coronary atherosclerosis 06/22/2007    Past Surgical History:  Inguinal herniorrhaphy  PTCA/stent  LEFT & RIGHT FOOT RECONSTRUCTION  hiatal hernia repair 2009   Family History:  neg for pituitary dz  mother died age 21 from phlebitis  father died age 59 from heart failure  8 siblings  alive age 2,80,89,92  1 died age 23 heart failure  3 died age 66,76,90 from heart problems    Social History:  married  2 children  retiired  never smoked  no etoh

## 2020-06-18 NOTE — Telephone Encounter (Signed)
Fine with me

## 2020-06-19 NOTE — Telephone Encounter (Signed)
Patient is aware of below message and voiced his understanding. Pending recall for 10/2020. Nothing further needed at this time.

## 2020-06-19 NOTE — Telephone Encounter (Signed)
Ok with me 

## 2020-06-22 ENCOUNTER — Ambulatory Visit: Payer: Medicare Other | Admitting: Family Medicine

## 2020-06-22 ENCOUNTER — Telehealth: Payer: Self-pay | Admitting: *Deleted

## 2020-06-22 NOTE — Telephone Encounter (Signed)
Pt called in, [facility] nurse called to check on him after his recent hospitilization He had told her that he was still a little SOB & to call his PCP He reported the same to me esp when he came up the stairs from his basement He has a pulse oximeter at home, it was 96% as we were talking, this was at rest for a couple of minutes He has oxygen at home that he is not currently using. I instructed him to use it continuously until he is seen tomorrow at the appointment I made for him tomorrow at noon with Je, time was his preference. Also instructed him to go to the ED if he got any worse before then

## 2020-06-23 ENCOUNTER — Ambulatory Visit (INDEPENDENT_AMBULATORY_CARE_PROVIDER_SITE_OTHER): Payer: Medicare Other | Admitting: Nurse Practitioner

## 2020-06-23 ENCOUNTER — Other Ambulatory Visit: Payer: Self-pay

## 2020-06-23 VITALS — BP 121/75 | HR 78 | Temp 97.0°F | Ht 64.0 in | Wt 163.0 lb

## 2020-06-23 DIAGNOSIS — D649 Anemia, unspecified: Secondary | ICD-10-CM | POA: Diagnosis not present

## 2020-06-23 LAB — HEMOGLOBIN, FINGERSTICK: Hemoglobin: 8.4 g/dL — ABNORMAL LOW (ref 12.6–17.7)

## 2020-06-23 NOTE — Progress Notes (Signed)
Acute Office Visit  Subjective:    Patient ID: [name], male    DOB: 08/14/1932, 87 y.o.   MRN: [id number]  Chief Complaint  Patient presents with  . Shortness of Breath    HPI Patient is in today for shortness of breath with exertion which is not new in the last few days.  Patient was recently in the hospital and emergency department on June 12, 2020 with symptomatic anemia with hemoglobin dropping to 7.6  and positive Hemoccult.  EEG was completed which showed a grade D esophagitis above GE junction without presence of bleeding.  Diverticulosis was found in the descending and sigmoid colon.  Patient was treated and stabilized, blood transfusions to correct anemia. On discharge hemoglobin was 8.8 patient is scheduled for GI consult and repeat EEG in June.  Today patient is reporting shortness of breath and increased fatigue.  We will repeat hemoglobin  Past Medical History:  Diagnosis Date  . Abnormality of gait 06/07/2013  . Anemia   . Aneurysm ([place])    on assending aorta, currently watching it, Dr Cyndia Bent  . Anxiety   . Arthritis   . Asthma   . Atrial fibrillation ([place])   . Barrett's esophagus   . CAD (coronary artery disease) 03/2006   3.0 x 20 mm TAXUS Perseus DES to the LAD; 01/2007  L main 30%, oLAD 50%, pLAD stent ok, CFX 80%, OM 60%, pRCA 60%, mRCA 70%, oPDA 90%; med rx   . Cancer ([place])    skin cancer   . Cataract    bilateral removal of cateracts  . COLONIC POLYPS 07/17/2008   Qualifier: Diagnosis of  By: Lovette Cliche, CNA, Christy    . Complication of anesthesia     no issues,but pt prefers spinal due to Pulmonary problems  . COPD (chronic obstructive pulmonary disease) ([place])    oxygen  on standby in home.  . Depression   . Diverticulosis   . Emphysema   . Enlarged prostate with urinary retention   . Esophageal stenosis   . GERD (gastroesophageal reflux disease)   . Hiatal hernia   . Hypothyroidism   . Neuropathy   . Osteoporosis   . Pituitary macroadenoma  ([place])   . Restless legs syndrome (RLS)   . UGIB (upper gastrointestinal bleed) 03/2013   EGD w/ large ulcer at GE junction    Past Surgical History:  Procedure Laterality Date  . ABDOMINAL HERNIA REPAIR   2008  . BIOPSY  06/13/2020   Procedure: BIOPSY;  Surgeon: Harvel Quale, MD;  [facility];  Service: Gastroenterology;;  gastric  . CARDIAC CATHETERIZATION  01/2007   L main 30%, oLAD 50%,  pLAD stent ok, CFX 80%, OM 60%, pRCA 60%, mRCA 70%, oPDA 90%; med rx  . cataract extraction both eyes    . COLONOSCOPY    . COLONOSCOPY WITH PROPOFOL N/A 06/13/2020   Procedure: COLONOSCOPY WITH PROPOFOL;  Surgeon: Harvel Quale, MD;  [facility];  Service: Gastroenterology;  Laterality: N/A;  . CORONARY ANGIOPLASTY    . CORONARY STENT PLACEMENT  03/2006   3.0 x 20 mm TAXUS Perseus DES to the LAD  . CYSTOSCOPY N/A 06/10/2015   Procedure: CYSTOSCOPY FULGRATION OF BLEEDING,  electovapor resection;  Surgeon: Irine Seal, MD;  [facility]: [facility];  Service: Urology;  Laterality: N/A;  . CYSTOSCOPY WITH INSERTION OF UROLIFT N/A 06/01/2015   Procedure: CYSTOSCOPY WITH INSERTION OF UROLIFT x4;  Surgeon: Irine Seal, MD;  Location:  [facility];  Service: Urology;  Laterality: N/A;  . ESOPHAGOGASTRODUODENOSCOPY N/A 04/20/2013   Procedure: ESOPHAGOGASTRODUODENOSCOPY (EGD);  Surgeon: Inda Castle, MD;  [facility];  Service: Endoscopy;  Laterality: N/A;  . ESOPHAGOGASTRODUODENOSCOPY N/A 03/25/2016   Procedure: ESOPHAGOGASTRODUODENOSCOPY (EGD);  Surgeon: Irene Shipper, MD;  Location: [facility];  Service: Endoscopy;  Laterality: N/A;  . ESOPHAGOGASTRODUODENOSCOPY (EGD) WITH PROPOFOL N/A 10/13/2015   Procedure: ESOPHAGOGASTRODUODENOSCOPY (EGD) WITH PROPOFOL;  Surgeon: Jerene Bears, MD;  [facility];  Service: Gastroenterology;  Laterality: N/A;  . ESOPHAGOGASTRODUODENOSCOPY (EGD) WITH PROPOFOL N/A 06/13/2020   Procedure: ESOPHAGOGASTRODUODENOSCOPY (EGD)  WITH PROPOFOL;  Surgeon: Harvel Quale, MD;  [facility];  Service: Gastroenterology;  Laterality: N/A;  . FOOT SURGERY  1994 left, 2002 right foot   bilateral foot reconstruciton  . FOOT SURGERY     reconstruction of both feet- no retained hardware.  Marland Kitchen HERNIA REPAIR    . HIATAL HERNIA REPAIR  01-04-2008  . JOINT REPLACEMENT    . MELANOMA EXCISION  2019  . POLYPECTOMY    . POLYPECTOMY  06/13/2020   Procedure: POLYPECTOMY;  Surgeon: Harvel Quale, MD;  [facility];  Service: Gastroenterology;;  ascending,   . PROSTATE SURGERY     x 2  . SAVORY DILATION N/A 03/25/2016   Procedure: SAVORY DILATION;  Surgeon: Irene Shipper, MD;  [facility];  Service: Endoscopy;  Laterality: N/A;  . TOTAL HIP ARTHROPLASTY  07/21/2011   Procedure: TOTAL HIP ARTHROPLASTY ANTERIOR APPROACH;  Surgeon: Mauri Pole, MD;  [facility];  Service: Orthopedics;  Laterality: Left;  . TOTAL HIP ARTHROPLASTY Right 09/07/2012   Procedure: RIGHT TOTAL HIP ARTHROPLASTY ANTERIOR APPROACH;  Surgeon: Mcarthur Rossetti, MD;  [facility];  Service: Orthopedics;  Laterality: Right;  . TRANSURETHRAL RESECTION OF BLADDER NECK N/A 11/04/2015   Procedure: RESECTION OF BLADDER NECK;  Surgeon: Cleon Gustin, MD;  [facility];  Service: Urology;  Laterality: N/A;  . TRANSURETHRAL RESECTION OF PROSTATE N/A 11/04/2015   Procedure: TRANSURETHRAL RESECTION OF THE PROSTATE (TURP); REMOVAL OF UROLIFT IMPLANTS X THREE;  Surgeon: Cleon Gustin, MD;  [facility];  Service: Urology;  Laterality: N/A;  . UPPER GASTROINTESTINAL ENDOSCOPY  12/2013   Dr Hilarie Fredrickson, gastritis  . VIDEO BRONCHOSCOPY Bilateral 02/01/2017   Procedure: VIDEO BRONCHOSCOPY WITHOUT FLUORO;  Surgeon: Tanda Rockers, MD;  [facility];  Service: Cardiopulmonary;  Laterality: Bilateral;    Family History  Problem Relation Age of Onset  . Heart disease Sister   . Heart failure Father    . Other Mother        phebitis related to Dakhari's birth, he was 14 weeks old when she died  . Colon cancer Neg Hx   . Esophageal cancer Neg Hx   . Rectal cancer Neg Hx   . Stomach cancer Neg Hx   . Colon polyps Neg Hx     Social History   Socioeconomic History  . Marital status: Widowed    Spouse name: Not on file  . Number of children: 2  . Years of education: 15  . Highest education level: Associate degree: occupational, Hotel manager, or vocational program  Occupational History  . Occupation: retired  Tobacco Use  . Smoking status: Never Smoker  . Smokeless tobacco: Never Used  Vaping Use  . Vaping Use: Never used  Substance and Sexual Activity  . Alcohol use: No  . Drug use: No  . Sexual  activity: Not Currently    Birth control/protection: None  Other Topics Concern  . Not on file  Social History Narrative   Patient drinks caffeine a few times a week.   Patient is right handed.   Social Determinants of Health   Financial Resource Strain: Low Risk   . Difficulty of Paying Living Expenses: Not hard at all  Food Insecurity: No Food Insecurity  . Worried About Charity fundraiser in the Last Year: Never true  . Ran Out of Food in the Last Year: Never true  Transportation Needs: No Transportation Needs  . Lack of Transportation (Medical): No  . Lack of Transportation (Non-Medical): No  Physical Activity: Insufficiently Active  . Days of Exercise per Week: 5 days  . Minutes of Exercise per Session: 20 min  Stress: No Stress Concern Present  . Feeling of Stress : Not at all  Social Connections: Moderately Integrated  . Frequency of Communication with Friends and Family: More than three times a week  . Frequency of Social Gatherings with Friends and Family: More than three times a week  . Attends Religious Services: More than 4 times per year  . Active Member of Clubs or Organizations: Yes  . Attends Archivist Meetings: More than 4 times per year  . Marital  Status: Widowed  Intimate Partner Violence: Not At Risk  . Fear of Current or Ex-Partner: No  . Emotionally Abused: No  . Physically Abused: No  . Sexually Abused: No    Outpatient Medications Prior to Visit  Medication Sig Dispense Refill  . albuterol (PROVENTIL HFA;VENTOLIN HFA) 108 (90 Base) MCG/ACT inhaler Inhale 2 puffs into the lungs every 6 (six) hours as needed for wheezing or shortness of breath. 18 g 2  . aspirin EC 81 MG tablet Take 1 tablet (81 mg total) by mouth daily. Swallow whole. 90 tablet 3  . bisacodyl (DULCOLAX) 10 MG suppository Place 10 mg rectally as needed for moderate constipation.    . bromocriptine (PARLODEL) 2.5 MG tablet Take 1 tablet (2.5 mg total) by mouth See admin instructions. TAKE 1/2 TABLET EVERY MORNING AND 1 TABLET EVERY EVENING 60 tablet 1  . budesonide-formoterol (SYMBICORT) 160-4.5 MCG/ACT inhaler Inhale 2 puffs into the lungs in the morning and at bedtime. 10.2 g 6  . cholecalciferol (VITAMIN D) 1000 UNITS tablet Take 1,000 Units by mouth daily.     . diclofenac Sodium (VOLTAREN) 1 % GEL Apply 2 g topically 4 (four) times daily. 50 g 1  . ferrous sulfate 325 (65 FE) MG tablet Take 1 tablet (325 mg total) by mouth daily with breakfast. 90 tablet 3  . guaiFENesin (MUCINEX) 600 MG 12 hr tablet Take 600 mg by mouth daily.    Marland Kitchen levalbuterol (XOPENEX) 0.63 MG/3ML nebulizer solution Take 3 mLs (0.63 mg total) by nebulization every 6 (six) hours as needed for wheezing or shortness of breath. 72 mL 1  . levothyroxine (SYNTHROID) 75 MCG tablet Take 1 tablet (75 mcg total) by mouth daily. 90 tablet 3  . magnesium oxide (MAG-OX) 400 (241.3 Mg) MG tablet Take 1 tablet (400 mg total) by mouth 2 (two) times daily. 15 tablet 0  . naphazoline-pheniramine (NAPHCON-A) 0.025-0.3 % ophthalmic solution Place 1 drop into both eyes 2 (two) times daily as needed for irritation or allergies.     . nitroGLYCERIN (NITROSTAT) 0.4 MG SL tablet Place 1 tablet (0.4 mg total) under  the tongue every 5 (five) minutes as needed for chest pain.  25 tablet 2  . pantoprazole (PROTONIX) 40 MG tablet Take 1 tablet (40 mg total) by mouth 2 (two) times daily. 180 tablet 0  . Respiratory Therapy Supplies (FLUTTER) DEVI Twice a day and prn as needed, may increase if feeling worse 1 each 0  . sodium chloride HYPERTONIC 3 % nebulizer solution Take by nebulization as needed for other. 750 mL 12  . sucralfate (CARAFATE) 1 g tablet Take 1 tablet three times daily as needed (Patient taking differently: Take 1 g by mouth 3 (three) times daily as needed (stomach).) 270 tablet 3  . tamsulosin (FLOMAX) 0.4 MG CAPS capsule Take 1 capsule (0.4 mg total) by mouth daily after supper. 90 capsule 3  . triamcinolone cream (KENALOG) 0.1 % Apply 1 application topically 2 (two) times daily. 30 g 0  . vitamin B-12 (CYANOCOBALAMIN) 1000 MCG tablet Take 1,000 mcg by mouth daily.    . vitamin E 180 MG (400 UNITS) capsule Take 400 Units by mouth daily.     No facility-administered medications prior to visit.    Allergies  Allergen Reactions  . Betadine [Povidone Iodine] Other (See Comments)    Reaction:  Blisters   . Gadavist [Gadobutrol]     hives  . Lortab [Hydrocodone-Acetaminophen] Nausea And Vomiting  . Penicillins Other (See Comments)    Reaction:  Lightheadedness  Has patient had a PCN reaction causing immediate rash, facial/tongue/throat swelling, SOB or lightheadedness with hypotension: Yes Has patient had a PCN reaction causing severe rash involving mucus membranes or skin necrosis: No Has patient had a PCN reaction that required hospitalization No Has patient had a PCN reaction occurring within the last 10 years: No If all of the above answers are "NO", then may proceed with Cephalosporin use.  Marland Kitchen Zetia [Ezetimibe] Other (See Comments)    Reaction:  Muscle weakness   . Zocor [Simvastatin] Nausea Only and Other (See Comments)    Reaction:  Muscle weakness     Review of Systems   Constitutional: Negative.   HENT: Negative.   Respiratory: Positive for shortness of breath.   Cardiovascular: Negative.   Gastrointestinal: Negative for abdominal distention, abdominal pain, blood in stool, nausea and vomiting.  Genitourinary: Negative.   All other systems reviewed and are negative.      Objective:    Physical Exam Vitals reviewed.  Constitutional:      Appearance: He is well-developed.  HENT:     Head: Normocephalic.     Nose: Nose normal. No congestion.  Eyes:     Conjunctiva/sclera: Conjunctivae normal.  Cardiovascular:     Rate and Rhythm: Normal rate and regular rhythm.     Pulses: Normal pulses.     Heart sounds: Normal heart sounds.  Pulmonary:     Effort: No respiratory distress.     Breath sounds: No decreased breath sounds or wheezing.  Chest:     Chest wall: No edema.  Musculoskeletal:     Cervical back: Normal range of motion.     Right lower leg: No edema.     Left lower leg: No edema.  Skin:    Findings: No rash.  Neurological:     Mental Status: He is alert and oriented to person, place, and time.  Psychiatric:        Behavior: Behavior normal.     BP 121/75   Pulse 78   Temp (!) 97 F (36.1 C) (Temporal)   Ht 5\' 4"  (1.626 m)   Wt 163 lb (73.9 kg)  SpO2 97%   BMI 27.98 kg/m  Wt Readings from Last 3 Encounters:  06/23/20 163 lb (73.9 kg)  06/18/20 167 lb 6.4 oz (75.9 kg)  06/13/20 166 lb 7.2 oz (75.5 kg)    There are no preventive care reminders to display for this patient.  There are no preventive care reminders to display for this patient.   Lab Results  Component Value Date   TSH 0.869 04/22/2020   Lab Results  Component Value Date   WBC 11.3 (H) 06/13/2020   HGB 8.8 (L) 06/13/2020   HCT 27.4 (L) 06/13/2020   MCV 98.8 06/13/2020   PLT 208 06/13/2020   Lab Results  Component Value Date   NA 137 06/13/2020   K 3.7 06/13/2020   CO2 23 06/13/2020   GLUCOSE 90 06/13/2020   BUN 13 06/13/2020   CREATININE  1.01 06/13/2020   BILITOT 0.8 06/12/2020   ALKPHOS 53 06/12/2020   AST 25 06/12/2020   ALT 19 06/12/2020   PROT 5.3 (L) 06/12/2020   ALBUMIN 2.9 (L) 06/12/2020   CALCIUM 7.9 (L) 06/13/2020   ANIONGAP 7 06/13/2020   GFR 66.12 06/10/2020   Lab Results  Component Value Date   CHOL 170 04/22/2020   Lab Results  Component Value Date   HDL 51 04/22/2020   Lab Results  Component Value Date   LDLCALC 103 (H) 04/22/2020   Lab Results  Component Value Date   TRIG 84 04/22/2020   Lab Results  Component Value Date   CHOLHDL 3.3 04/22/2020   No results found for: HGBA1C     Assessment & Plan:   Problem List Items Addressed This Visit      Other   Low hemoglobin - Primary    Repeat hemoglobin: Globin today 8.4 from 8.8.  Referred patient back to GI.  Follow-up with worsening or unresolved symptoms.      Relevant Orders   Hemoglobin, fingerstick (Completed)       No orders of the defined types were placed in this encounter.    Ivy Lynn, NP

## 2020-06-23 NOTE — Patient Instructions (Signed)
[employer] medicine (25th ed., pp. [phone]). [place], [place]: Elsevier.">  Anemia  Anemia is a condition in which there is not enough red blood cells or hemoglobin in the blood. Hemoglobin is a substance in red blood cells that carries oxygen. When you do not have enough red blood cells or hemoglobin (are anemic), your body cannot get enough oxygen and your organs may not work properly. As a result, you may feel very tired or have other problems. What are the causes? Common causes of anemia include:  Excessive bleeding. Anemia can be caused by excessive bleeding inside or outside the body, including bleeding from the intestines or from heavy menstrual periods in females.  Poor nutrition.  Long-lasting (chronic) kidney, thyroid, and liver disease.  Bone marrow disorders, spleen problems, and blood disorders.  Cancer and treatments for cancer.  HIV (human immunodeficiency virus) and AIDS (acquired immunodeficiency syndrome).  Infections, medicines, and autoimmune disorders that destroy red blood cells. What are the signs or symptoms? Symptoms of this condition include:  Minor weakness.  Dizziness.  Headache, or difficulties concentrating and sleeping.  Heartbeats that feel irregular or faster than normal (palpitations).  Shortness of breath, especially with exercise.  Pale skin, lips, and nails, or cold hands and feet.  Indigestion and nausea. Symptoms may occur suddenly or develop slowly. If your anemia is mild, you may not have symptoms. How is this diagnosed? This condition is diagnosed based on blood tests, your medical history, and a physical exam. In some cases, a test may be needed in which cells are removed from the soft tissue inside of a bone and looked at under a microscope (bone marrow biopsy). Your health care provider may also check your stool (feces) for blood and may do additional testing to look for the cause of your bleeding. Other tests may  include:  Imaging tests, such as a CT scan or MRI.  A procedure to see inside your esophagus and stomach (endoscopy).  A procedure to see inside your colon and rectum (colonoscopy). How is this treated? Treatment for this condition depends on the cause. If you continue to lose a lot of blood, you may need to be treated at a hospital. Treatment may include:  Taking supplements of iron, vitamin Q68, or folic acid.  Taking a hormone medicine (erythropoietin) that can help to stimulate red blood cell growth.  Having a blood transfusion. This may be needed if you lose a lot of blood.  Making changes to your diet.  Having surgery to remove your spleen. Follow these instructions at home:  Take over-the-counter and prescription medicines only as told by your health care provider.  Take supplements only as told by your health care provider.  Follow any diet instructions that you were given by your health care provider.  Keep all follow-up visits as told by your health care provider. This is important. Contact a health care provider if:  You develop new bleeding anywhere in the body. Get help right away if:  You are very weak.  You are short of breath.  You have pain in your abdomen or chest.  You are dizzy or feel faint.  You have trouble concentrating.  You have bloody stools, black stools, or tarry stools.  You vomit repeatedly or you vomit up blood. These symptoms may represent a serious problem that is an emergency. Do not wait to see if the symptoms will go away. Get medical help right away. Call your local emergency services (911 in the [place] Do not  drive yourself to the hospital. Summary  Anemia is a condition in which you do not have enough red blood cells or enough of a substance in your red blood cells that carries oxygen (hemoglobin).  Symptoms may occur suddenly or develop slowly.  If your anemia is mild, you may not have symptoms.  This condition is  diagnosed with blood tests, a medical history, and a physical exam. Other tests may be needed.  Treatment for this condition depends on the cause of the anemia. This information is not intended to replace advice given to you by your health care provider. Make sure you discuss any questions you have with your health care provider. Document Revised: 01/22/2019 Document Reviewed: 01/22/2019 Elsevier Patient Education  2021 Elsevier Inc.  

## 2020-06-24 ENCOUNTER — Other Ambulatory Visit: Payer: Self-pay | Admitting: Nurse Practitioner

## 2020-06-24 DIAGNOSIS — D649 Anemia, unspecified: Secondary | ICD-10-CM

## 2020-06-25 NOTE — Assessment & Plan Note (Signed)
Repeat hemoglobin: Globin today 8.4 from 8.8.  Referred patient back to GI.  Follow-up with worsening or unresolved symptoms.

## 2020-06-26 ENCOUNTER — Telehealth: Payer: Self-pay | Admitting: Pulmonary Disease

## 2020-06-26 NOTE — Telephone Encounter (Signed)
Pharmacy called to state that a refill is needed for Xopenox. [facility] can be contacted at [phone]

## 2020-06-26 NOTE — Telephone Encounter (Signed)
Called and spoke to patient, who is requesting clarification on nebulizer medications.  Per last OV note, patient was instructed to start hypertonic 3%. Patient stated that he used Brovana in the past, and he wanted to be sure that he did not need to resume [place]. Patient is aware that he does NOT need to resume Brovana based off of last OV note.   Spoke to [place] with [employer], who stated that she spoke with patient in reference to hypertonic 3% and Rx will be delivered to patient's home today. Per Sharyn Lull, patient does NOT need a refill on Xopenox. Nothing further needed at this time.

## 2020-06-29 ENCOUNTER — Other Ambulatory Visit: Payer: Self-pay | Admitting: Internal Medicine

## 2020-07-01 ENCOUNTER — Other Ambulatory Visit (INDEPENDENT_AMBULATORY_CARE_PROVIDER_SITE_OTHER): Payer: Medicare Other

## 2020-07-01 DIAGNOSIS — D649 Anemia, unspecified: Secondary | ICD-10-CM | POA: Diagnosis not present

## 2020-07-01 LAB — CBC WITH DIFFERENTIAL/PLATELET
Basophils Absolute: 0.2 10*3/uL — ABNORMAL HIGH (ref 0.0–0.1)
Basophils Relative: 2.4 % (ref 0.0–3.0)
Eosinophils Absolute: 0.3 10*3/uL (ref 0.0–0.7)
Eosinophils Relative: 4 % (ref 0.0–5.0)
HCT: 29.6 % — ABNORMAL LOW (ref 39.0–52.0)
Hemoglobin: 9.8 g/dL — ABNORMAL LOW (ref 13.0–17.0)
Lymphocytes Relative: 16.3 % (ref 12.0–46.0)
Lymphs Abs: 1.1 10*3/uL (ref 0.7–4.0)
MCHC: 33.3 g/dL (ref 30.0–36.0)
MCV: 96.2 fl (ref 78.0–100.0)
Monocytes Absolute: 1.1 10*3/uL — ABNORMAL HIGH (ref 0.1–1.0)
Monocytes Relative: 15.4 % — ABNORMAL HIGH (ref 3.0–12.0)
Neutro Abs: 4.2 10*3/uL (ref 1.4–7.7)
Neutrophils Relative %: 61.9 % (ref 43.0–77.0)
Platelets: 294 10*3/uL (ref 150.0–400.0)
RBC: 3.08 Mil/uL — ABNORMAL LOW (ref 4.22–5.81)
RDW: 16.9 % — ABNORMAL HIGH (ref 11.5–15.5)
WBC: 6.8 10*3/uL (ref 4.0–10.5)

## 2020-07-07 ENCOUNTER — Other Ambulatory Visit: Payer: Self-pay

## 2020-07-07 ENCOUNTER — Telehealth: Payer: Self-pay | Admitting: Internal Medicine

## 2020-07-07 ENCOUNTER — Other Ambulatory Visit: Payer: Medicare Other

## 2020-07-07 ENCOUNTER — Telehealth: Payer: Self-pay

## 2020-07-07 DIAGNOSIS — D649 Anemia, unspecified: Secondary | ICD-10-CM

## 2020-07-07 LAB — HEMOGLOBIN, FINGERSTICK: Hemoglobin: 10.9 g/dL — ABNORMAL LOW (ref 12.6–17.7)

## 2020-07-07 NOTE — Telephone Encounter (Signed)
Pt wants to know if he needs an ov with [facility]TING[facility] to recheck  hemoglobin here in the office or a lab apt. Please call back

## 2020-07-07 NOTE — Telephone Encounter (Signed)
Inbound call from patient. Wants test results, especially for hemoglobin. Best contact number [phone]

## 2020-07-07 NOTE — Telephone Encounter (Signed)
Let pt know his last Hgb here was 9.8.

## 2020-07-07 NOTE — Telephone Encounter (Signed)
FYI, patient was advised to come in for lab only to recheck his hemoglobin level.  He has an appointment with you on 07/16/20 for a 3 month and also has appointment with Dr. Hilarie Fredrickson, [facility], on 07/23/20.

## 2020-07-14 ENCOUNTER — Ambulatory Visit: Payer: Medicare Other | Admitting: Endocrinology

## 2020-07-14 ENCOUNTER — Other Ambulatory Visit: Payer: Self-pay

## 2020-07-14 VITALS — BP 140/70 | HR 66 | Ht 64.0 in | Wt 163.6 lb

## 2020-07-14 DIAGNOSIS — D539 Nutritional anemia, unspecified: Secondary | ICD-10-CM

## 2020-07-14 DIAGNOSIS — E221 Hyperprolactinemia: Secondary | ICD-10-CM

## 2020-07-14 DIAGNOSIS — E039 Hypothyroidism, unspecified: Secondary | ICD-10-CM

## 2020-07-14 LAB — CBC WITH DIFFERENTIAL/PLATELET
Basophils Absolute: 0.1 10*3/uL (ref 0.0–0.1)
Basophils Relative: 1.1 % (ref 0.0–3.0)
Eosinophils Absolute: 0.2 10*3/uL (ref 0.0–0.7)
Eosinophils Relative: 2.3 % (ref 0.0–5.0)
HCT: 32.9 % — ABNORMAL LOW (ref 39.0–52.0)
Hemoglobin: 10.8 g/dL — ABNORMAL LOW (ref 13.0–17.0)
Lymphocytes Relative: 12.1 % (ref 12.0–46.0)
Lymphs Abs: 1 10*3/uL (ref 0.7–4.0)
MCHC: 33 g/dL (ref 30.0–36.0)
MCV: 94.2 fl (ref 78.0–100.0)
Monocytes Absolute: 1.1 10*3/uL — ABNORMAL HIGH (ref 0.1–1.0)
Monocytes Relative: 12.5 % — ABNORMAL HIGH (ref 3.0–12.0)
Neutro Abs: 6.2 10*3/uL (ref 1.4–7.7)
Neutrophils Relative %: 72 % (ref 43.0–77.0)
Platelets: 225 10*3/uL (ref 150.0–400.0)
RBC: 3.49 Mil/uL — ABNORMAL LOW (ref 4.22–5.81)
RDW: 15.9 % — ABNORMAL HIGH (ref 11.5–15.5)
WBC: 8.6 10*3/uL (ref 4.0–10.5)

## 2020-07-14 LAB — IBC PANEL
Iron: 261 ug/dL — ABNORMAL HIGH (ref 42–165)
Saturation Ratios: 79 % — ABNORMAL HIGH (ref 20.0–50.0)
Transferrin: 236 mg/dL (ref 212.0–360.0)

## 2020-07-14 LAB — TSH: TSH: 1.58 u[IU]/mL (ref 0.35–4.50)

## 2020-07-14 LAB — T4, FREE: Free T4: 1 ng/dL (ref 0.60–1.60)

## 2020-07-14 MED ORDER — BROMOCRIPTINE MESYLATE 2.5 MG PO TABS: 1.2500 mg | ORAL_TABLET | Freq: Two times a day (BID) | ORAL | 3 refills | Status: DC

## 2020-07-14 NOTE — Patient Instructions (Addendum)
Blood tests are requested for you today.  We'll let you know about the results.   We can skip the MRI this year.   Please reduce the bromocriptine to 1/2 pill, twice a day.   Please come back for a follow-up appointment in 6 months.

## 2020-07-14 NOTE — Progress Notes (Signed)
Subjective:    Patient ID: [name], male    DOB: 09/13/1932, 87 y.o.   MRN: [id number]  HPI Pt returns for f/u of: pituitary macroadenoma (found incidentally on MRI of the C-spine in 2009; he sees Dr Carloyn Manner for this; last MRI in 2020 was unchanged, but he had a rash after Gd infusion):  denies headache.  hyperprolactinemia: (dx'ed 2009; prob due to stalk compression; he was rx'ed parlodel, but dosage has been limited by dizziness; he also had this at minimal dosage of cabergoline).  Lightheadedness persists.   Chronic primary hypothyroidism:   He takes synthroid as rx'ed.   Pt requests to check Hb Past Medical History:  Diagnosis Date  . Abnormality of gait 06/07/2013  . Anemia   . Aneurysm ([place])    on assending aorta, currently watching it, Dr Cyndia Bent  . Anxiety   . Arthritis   . Asthma   . Atrial fibrillation ([place])   . Barrett's esophagus   . CAD (coronary artery disease) 03/2006   3.0 x 20 mm TAXUS Perseus DES to the LAD; 01/2007  L main 30%, oLAD 50%, pLAD stent ok, CFX 80%, OM 60%, pRCA 60%, mRCA 70%, oPDA 90%; med rx   . Cancer ([place])    skin cancer   . Cataract    bilateral removal of cateracts  . COLONIC POLYPS 07/17/2008   Qualifier: Diagnosis of  By: Lovette Cliche, CNA, Christy    . Complication of anesthesia     no issues,but pt prefers spinal due to Pulmonary problems  . COPD (chronic obstructive pulmonary disease) ([place])    oxygen  on standby in home.  . Depression   . Diverticulosis   . Emphysema   . Enlarged prostate with urinary retention   . Esophageal stenosis   . GERD (gastroesophageal reflux disease)   . Hiatal hernia   . Hypothyroidism   . Neuropathy   . Osteoporosis   . Pituitary macroadenoma ([place])   . Restless legs syndrome (RLS)   . UGIB (upper gastrointestinal bleed) 03/2013   EGD w/ large ulcer at GE junction    Past Surgical History:  Procedure Laterality Date  . ABDOMINAL HERNIA REPAIR   2008  . BIOPSY  06/13/2020   Procedure: BIOPSY;  Surgeon:  Harvel Quale, MD;  [facility];  Service: Gastroenterology;;  gastric  . CARDIAC CATHETERIZATION  01/2007   L main 30%, oLAD 50%,  pLAD stent ok, CFX 80%, OM 60%, pRCA 60%, mRCA 70%, oPDA 90%; med rx  . cataract extraction both eyes    . COLONOSCOPY    . COLONOSCOPY WITH PROPOFOL N/A 06/13/2020   Procedure: COLONOSCOPY WITH PROPOFOL;  Surgeon: Harvel Quale, MD;  [facility];  Service: Gastroenterology;  Laterality: N/A;  . CORONARY ANGIOPLASTY    . CORONARY STENT PLACEMENT  03/2006   3.0 x 20 mm TAXUS Perseus DES to the LAD  . CYSTOSCOPY N/A 06/10/2015   Procedure: CYSTOSCOPY FULGRATION OF BLEEDING,  electovapor resection;  Surgeon: Irine Seal, MD;  Location: [facility];  Service: Urology;  Laterality: N/A;  . CYSTOSCOPY WITH INSERTION OF UROLIFT N/A 06/01/2015   Procedure: CYSTOSCOPY WITH INSERTION OF UROLIFT x4;  Surgeon: Irine Seal, MD;  [facility]: [facility];  Service: Urology;  Laterality: N/A;  . ESOPHAGOGASTRODUODENOSCOPY N/A 04/20/2013   Procedure: ESOPHAGOGASTRODUODENOSCOPY (EGD);  Surgeon: Inda Castle, MD;  [facility];  Service: Endoscopy;  Laterality: N/A;  . ESOPHAGOGASTRODUODENOSCOPY N/A 03/25/2016   Procedure: ESOPHAGOGASTRODUODENOSCOPY (EGD);  Surgeon: Irene Shipper, MD;  Location: [facility];  Service: Endoscopy;  Laterality: N/A;  . ESOPHAGOGASTRODUODENOSCOPY (EGD) WITH PROPOFOL N/A 10/13/2015   Procedure: ESOPHAGOGASTRODUODENOSCOPY (EGD) WITH PROPOFOL;  Surgeon: Jerene Bears, MD;  [facility];  Service: Gastroenterology;  Laterality: N/A;  . ESOPHAGOGASTRODUODENOSCOPY (EGD) WITH PROPOFOL N/A 06/13/2020   Procedure: ESOPHAGOGASTRODUODENOSCOPY (EGD) WITH PROPOFOL;  Surgeon: Harvel Quale, MD;  [facility];  Service: Gastroenterology;  Laterality: N/A;  . FOOT SURGERY  1994 left, 2002 right foot   bilateral foot reconstruciton  . FOOT SURGERY     reconstruction of both feet- no retained  hardware.  Marland Kitchen HERNIA REPAIR    . HIATAL HERNIA REPAIR  01-04-2008  . JOINT REPLACEMENT    . MELANOMA EXCISION  2019  . POLYPECTOMY    . POLYPECTOMY  06/13/2020   Procedure: POLYPECTOMY;  Surgeon: Harvel Quale, MD;  [facility];  Service: Gastroenterology;;  ascending,   . PROSTATE SURGERY     x 2  . SAVORY DILATION N/A 03/25/2016   Procedure: SAVORY DILATION;  Surgeon: Irene Shipper, MD;  [facility];  Service: Endoscopy;  Laterality: N/A;  . TOTAL HIP ARTHROPLASTY  07/21/2011   Procedure: TOTAL HIP ARTHROPLASTY ANTERIOR APPROACH;  Surgeon: Mauri Pole, MD;  [facility];  Service: Orthopedics;  Laterality: Left;  . TOTAL HIP ARTHROPLASTY Right 09/07/2012   Procedure: RIGHT TOTAL HIP ARTHROPLASTY ANTERIOR APPROACH;  Surgeon: Mcarthur Rossetti, MD;  [facility];  Service: Orthopedics;  Laterality: Right;  . TRANSURETHRAL RESECTION OF BLADDER NECK N/A 11/04/2015   Procedure: RESECTION OF BLADDER NECK;  Surgeon: Cleon Gustin, MD;  [facility]: [facility];  Service: Urology;  Laterality: N/A;  . TRANSURETHRAL RESECTION OF PROSTATE N/A 11/04/2015   Procedure: TRANSURETHRAL RESECTION OF THE PROSTATE (TURP); REMOVAL OF UROLIFT IMPLANTS X THREE;  Surgeon: Cleon Gustin, MD;  Location[facility];  Service: Urology;  Laterality: N/A;  . UPPER GASTROINTESTINAL ENDOSCOPY  12/2013   Dr Hilarie Fredrickson, gastritis  . VIDEO BRONCHOSCOPY Bilateral 02/01/2017   Procedure: VIDEO BRONCHOSCOPY WITHOUT FLUORO;  Surgeon: Tanda Rockers, MD;  [facility];  Service: Cardiopulmonary;  Laterality: Bilateral;    Social History   Socioeconomic History  . Marital status: Widowed    Spouse name: Not on file  . Number of children: 2  . Years of education: 48  . Highest education level: Associate degree: occupational, Hotel manager, or vocational program  Occupational History  . Occupation: retired  Tobacco Use  . Smoking status: Never Smoker  . Smokeless tobacco: Never  Used  Vaping Use  . Vaping Use: Never used  Substance and Sexual Activity  . Alcohol use: No  . Drug use: No  . Sexual activity: Not Currently    Birth control/protection: None  Other Topics Concern  . Not on file  Social History Narrative   Patient drinks caffeine a few times a week.   Patient is right handed.   Social Determinants of Health   Financial Resource Strain: Low Risk   . Difficulty of Paying Living Expenses: Not hard at all  Food Insecurity: No Food Insecurity  . Worried About Charity fundraiser in the Last Year: Never true  . Ran Out of Food in the Last Year: Never true  Transportation Needs: No Transportation Needs  . Lack of Transportation (Medical): No  . Lack of Transportation (Non-Medical): No  Physical Activity: Insufficiently Active  . Days of Exercise per Week:  5 days  . Minutes of Exercise per Session: 20 min  Stress: No Stress Concern Present  . Feeling of Stress : Not at all  Social Connections: Moderately Integrated  . Frequency of Communication with Friends and Family: More than three times a week  . Frequency of Social Gatherings with Friends and Family: More than three times a week  . Attends Religious Services: More than 4 times per year  . Active Member of Clubs or Organizations: Yes  . Attends Archivist Meetings: More than 4 times per year  . Marital Status: Widowed  Intimate Partner Violence: Not At Risk  . Fear of Current or Ex-Partner: No  . Emotionally Abused: No  . Physically Abused: No  . Sexually Abused: No    Current Outpatient Medications on File Prior to Visit  Medication Sig Dispense Refill  . albuterol (PROVENTIL HFA;VENTOLIN HFA) 108 (90 Base) MCG/ACT inhaler Inhale 2 puffs into the lungs every 6 (six) hours as needed for wheezing or shortness of breath. 18 g 2  . aspirin EC 81 MG tablet Take 1 tablet (81 mg total) by mouth daily. Swallow whole. 90 tablet 3  . bisacodyl (DULCOLAX) 10 MG suppository Place 10 mg  rectally as needed for moderate constipation.    . budesonide-formoterol (SYMBICORT) 160-4.5 MCG/ACT inhaler Inhale 2 puffs into the lungs in the morning and at bedtime. 10.2 g 6  . cholecalciferol (VITAMIN D) 1000 UNITS tablet Take 1,000 Units by mouth daily.     . diclofenac Sodium (VOLTAREN) 1 % GEL Apply 2 g topically 4 (four) times daily. 50 g 1  . ferrous sulfate 325 (65 FE) MG tablet Take 1 tablet (325 mg total) by mouth daily with breakfast. 90 tablet 3  . GAVILAX 17 GM/SCOOP powder DISSOLVE 17 GM SCOOP IN 8 OZ. OF WATER AND DRINK ONCE DAILY AS NEEDED FOR CONSTIPATION 510 g 0  . guaiFENesin (MUCINEX) 600 MG 12 hr tablet Take 600 mg by mouth daily.    Marland Kitchen levalbuterol (XOPENEX) 0.63 MG/3ML nebulizer solution Take 3 mLs (0.63 mg total) by nebulization every 6 (six) hours as needed for wheezing or shortness of breath. 72 mL 1  . levothyroxine (SYNTHROID) 75 MCG tablet Take 1 tablet (75 mcg total) by mouth daily. 90 tablet 3  . magnesium oxide (MAG-OX) 400 (241.3 Mg) MG tablet Take 1 tablet (400 mg total) by mouth 2 (two) times daily. 15 tablet 0  . naphazoline-pheniramine (NAPHCON-A) 0.025-0.3 % ophthalmic solution Place 1 drop into both eyes 2 (two) times daily as needed for irritation or allergies.     . nitroGLYCERIN (NITROSTAT) 0.4 MG SL tablet Place 1 tablet (0.4 mg total) under the tongue every 5 (five) minutes as needed for chest pain. 25 tablet 2  . pantoprazole (PROTONIX) 40 MG tablet Take 1 tablet (40 mg total) by mouth 2 (two) times daily. 180 tablet 0  . Respiratory Therapy Supplies (FLUTTER) DEVI Twice a day and prn as needed, may increase if feeling worse 1 each 0  . sodium chloride HYPERTONIC 3 % nebulizer solution Take by nebulization as needed for other. 750 mL 12  . sucralfate (CARAFATE) 1 g tablet Take 1 tablet three times daily as needed (Patient taking differently: Take 1 g by mouth 3 (three) times daily as needed (stomach).) 270 tablet 3  . tamsulosin (FLOMAX) 0.4 MG CAPS  capsule Take 1 capsule (0.4 mg total) by mouth daily after supper. 90 capsule 3  . triamcinolone cream (KENALOG) 0.1 % Apply  1 application topically 2 (two) times daily. 30 g 0  . vitamin B-12 (CYANOCOBALAMIN) 1000 MCG tablet Take 1,000 mcg by mouth daily.    . vitamin E 180 MG (400 UNITS) capsule Take 400 Units by mouth daily.     No current facility-administered medications on file prior to visit.    Allergies  Allergen Reactions  . Betadine [Povidone Iodine] Other (See Comments)    Reaction:  Blisters   . Gadavist [Gadobutrol]     hives  . Lortab [Hydrocodone-Acetaminophen] Nausea And Vomiting  . Penicillins Other (See Comments)    Reaction:  Lightheadedness  Has patient had a PCN reaction causing immediate rash, facial/tongue/throat swelling, SOB or lightheadedness with hypotension: Yes Has patient had a PCN reaction causing severe rash involving mucus membranes or skin necrosis: No Has patient had a PCN reaction that required hospitalization No Has patient had a PCN reaction occurring within the last 10 years: No If all of the above answers are "NO", then may proceed with Cephalosporin use.  Marland Kitchen Zetia [Ezetimibe] Other (See Comments)    Reaction:  Muscle weakness   . Zocor [Simvastatin] Nausea Only and Other (See Comments)    Reaction:  Muscle weakness     Family History  Problem Relation Age of Onset  . Heart disease Sister   . Heart failure Father   . Other Mother        phebitis related to Syris's birth, he was 64 weeks old when she died  . Colon cancer Neg Hx   . Esophageal cancer Neg Hx   . Rectal cancer Neg Hx   . Stomach cancer Neg Hx   . Colon polyps Neg Hx     BP 140/70 (BP Location: Right Arm, Patient Position: Sitting, Cuff Size: Large)   Pulse 66   Ht 5\' 4"  (1.626 m)   Wt 163 lb 9.6 oz (74.2 kg)   SpO2 97%   BMI 28.08 kg/m    Review of Systems Denies headache and visual loss.      Objective:   Physical Exam VITAL SIGNS:  See vs page GENERAL: no  distress GAIT: normal and steady.  Prolactin=292    Assessment & Plan:  elev prolactin, worse.  I advised repeat MRI Lightheadedness.  Reduce bromocriptine After MRI, plan will be to change to cabergoline.

## 2020-07-15 ENCOUNTER — Telehealth: Payer: Self-pay | Admitting: Endocrinology

## 2020-07-15 LAB — PROLACTIN: Prolactin: 291.6 ng/mL — ABNORMAL HIGH (ref 2.0–18.0)

## 2020-07-15 NOTE — Telephone Encounter (Signed)
Pt would like a call back regarding his blood work as soon as possible.

## 2020-07-16 ENCOUNTER — Encounter: Payer: Self-pay | Admitting: Family Medicine

## 2020-07-16 ENCOUNTER — Ambulatory Visit (INDEPENDENT_AMBULATORY_CARE_PROVIDER_SITE_OTHER): Payer: Medicare Other | Admitting: Family Medicine

## 2020-07-16 ENCOUNTER — Other Ambulatory Visit: Payer: Self-pay

## 2020-07-16 VITALS — BP 129/75 | HR 59 | Ht 64.0 in | Wt 167.0 lb

## 2020-07-16 DIAGNOSIS — I480 Paroxysmal atrial fibrillation: Secondary | ICD-10-CM | POA: Diagnosis not present

## 2020-07-16 DIAGNOSIS — N3946 Mixed incontinence: Secondary | ICD-10-CM

## 2020-07-16 DIAGNOSIS — J449 Chronic obstructive pulmonary disease, unspecified: Secondary | ICD-10-CM | POA: Diagnosis not present

## 2020-07-16 DIAGNOSIS — E039 Hypothyroidism, unspecified: Secondary | ICD-10-CM | POA: Diagnosis not present

## 2020-07-16 DIAGNOSIS — N400 Enlarged prostate without lower urinary tract symptoms: Secondary | ICD-10-CM

## 2020-07-16 DIAGNOSIS — E785 Hyperlipidemia, unspecified: Secondary | ICD-10-CM

## 2020-07-16 NOTE — Progress Notes (Addendum)
BP 129/75   Pulse (!) 59   Ht 5\' 4"  (1.626 m)   Wt 167 lb (75.8 kg)   SpO2 97%   BMI 28.67 kg/m    Subjective:   Patient ID: [name], male    DOB: 1933-01-04, 87 y.o.   MRN: [id number]  HPI: [name] is a 87 y.o. male presenting on 07/16/2020 for Medical Management of Chronic Issues, Hyperlipidemia, and Hypothyroidism   HPI Hypothyroidism recheck Patient is coming in for thyroid recheck today as well. They deny any issues with hair changes or heat or cold problems or diarrhea or constipation. They deny any chest pain or palpitations. They are currently on levothyroxine 75 micrograms   COPD Patient is coming in for COPD recheck today.  He is currently on Xopenex and Symbicort.  He has a mild chronic cough but denies any major coughing spells or wheezing spells.  He has 0nighttime symptoms per week and 0daytime symptoms per week currently.   Hyperlipidemia Patient is coming in for recheck of his hyperlipidemia. The patient is currently taking none currently when monitoring and diet control. They deny any issues with myalgias or history of liver damage from it. They deny any focal numbness or weakness or chest pain.   Patient has chronic A. fib and come in for recheck for today. Denies any bleeding or bruising, has the occasional palpitations.  BPH Patient is coming in for recheck on BPH Symptoms: Patient has had recurrent issues with mixed incontinence and wears depends currently and would like to try condom catheters Medication: Flomax   Relevant past medical, surgical, family and social history reviewed and updated as indicated. Interim medical history since our last visit reviewed. Allergies and medications reviewed and updated.  Review of Systems  Constitutional:  Negative for chills and fever.  Eyes:  Negative for visual disturbance.  Respiratory:  Negative for shortness of breath and wheezing.   Cardiovascular:  Positive for palpitations. Negative for  chest pain and leg swelling.  Musculoskeletal:  Negative for back pain and gait problem.  Skin:  Negative for rash.  Neurological:  Negative for dizziness, weakness and light-headedness.  All other systems reviewed and are negative.  Per HPI unless specifically indicated above   Allergies as of 07/16/2020       Reactions   Betadine [povidone Iodine] Other (See Comments)   Reaction:  Blisters    Gadavist [gadobutrol]    hives   Lortab [hydrocodone-acetaminophen] Nausea And Vomiting   Penicillins Other (See Comments)   Reaction:  Lightheadedness  Has patient had a PCN reaction causing immediate rash, facial/tongue/throat swelling, SOB or lightheadedness with hypotension: Yes Has patient had a PCN reaction causing severe rash involving mucus membranes or skin necrosis: No Has patient had a PCN reaction that required hospitalization No Has patient had a PCN reaction occurring within the last 10 years: No If all of the above answers are "NO", then may proceed with Cephalosporin use.   Zetia [ezetimibe] Other (See Comments)   Reaction:  Muscle weakness    Zocor [simvastatin] Nausea Only, Other (See Comments)   Reaction:  Muscle weakness         Medication List        Accurate as of Jul 16, 2020 11:35 AM. If you have any questions, ask your nurse or doctor.          albuterol 108 (90 Base) MCG/ACT inhaler Commonly known as: VENTOLIN HFA Inhale 2 puffs into the lungs every 6 (  six) hours as needed for wheezing or shortness of breath.   aspirin EC 81 MG tablet Take 1 tablet (81 mg total) by mouth daily. Swallow whole.   bisacodyl 10 MG suppository Commonly known as: DULCOLAX Place 10 mg rectally as needed for moderate constipation.   bromocriptine 2.5 MG tablet Commonly known as: PARLODEL Take 0.5 tablets (1.25 mg total) by mouth 2 (two) times daily.   budesonide-formoterol 160-4.5 MCG/ACT inhaler Commonly known as: Symbicort Inhale 2 puffs into the lungs in the  morning and at bedtime.   cholecalciferol 1000 units tablet Commonly known as: VITAMIN D Take 1,000 Units by mouth daily.   diclofenac Sodium 1 % Gel Commonly known as: VOLTAREN Apply 2 g topically 4 (four) times daily.   ferrous sulfate 325 (65 FE) MG tablet Take 1 tablet (325 mg total) by mouth daily with breakfast.   Flutter Devi Twice a day and prn as needed, may increase if feeling worse   GaviLAX 17 GM/SCOOP powder Generic drug: polyethylene glycol powder DISSOLVE 17 GM SCOOP IN 8 OZ. OF WATER AND DRINK ONCE DAILY AS NEEDED FOR CONSTIPATION   guaiFENesin 600 MG 12 hr tablet Commonly known as: MUCINEX Take 600 mg by mouth daily.   levalbuterol 0.63 MG/3ML nebulizer solution Commonly known as: Xopenex Take 3 mLs (0.63 mg total) by nebulization every 6 (six) hours as needed for wheezing or shortness of breath.   levothyroxine 75 MCG tablet Commonly known as: SYNTHROID Take 1 tablet (75 mcg total) by mouth daily.   magnesium oxide 400 (241.3 Mg) MG tablet Commonly known as: MAG-OX Take 1 tablet (400 mg total) by mouth 2 (two) times daily.   naphazoline-pheniramine 0.025-0.3 % ophthalmic solution Commonly known as: NAPHCON-A Place 1 drop into both eyes 2 (two) times daily as needed for irritation or allergies.   nitroGLYCERIN 0.4 MG SL tablet Commonly known as: NITROSTAT Place 1 tablet (0.4 mg total) under the tongue every 5 (five) minutes as needed for chest pain.   pantoprazole 40 MG tablet Commonly known as: PROTONIX Take 1 tablet (40 mg total) by mouth 2 (two) times daily.   sodium chloride HYPERTONIC 3 % nebulizer solution Take by nebulization as needed for other.   sucralfate 1 g tablet Commonly known as: CARAFATE Take 1 tablet three times daily as needed What changed:  how much to take how to take this when to take this reasons to take this additional instructions   tamsulosin 0.4 MG Caps capsule Commonly known as: FLOMAX Take 1 capsule (0.4 mg  total) by mouth daily after supper.   triamcinolone cream 0.1 % Commonly known as: KENALOG Apply 1 application topically 2 (two) times daily.   vitamin B-12 1000 MCG tablet Commonly known as: CYANOCOBALAMIN Take 1,000 mcg by mouth daily.   vitamin E 180 MG (400 UNITS) capsule Take 400 Units by mouth daily.         Objective:   BP 129/75   Pulse (!) 59   Ht 5\' 4"  (1.626 m)   Wt 167 lb (75.8 kg)   SpO2 97%   BMI 28.67 kg/m   Wt Readings from Last 3 Encounters:  07/16/20 167 lb (75.8 kg)  07/14/20 163 lb 9.6 oz (74.2 kg)  06/23/20 163 lb (73.9 kg)    Physical Exam Vitals and nursing note reviewed.  Constitutional:      General: He is not in acute distress.    Appearance: He is well-developed. He is not diaphoretic.  Eyes:  General: No scleral icterus.    Conjunctiva/sclera: Conjunctivae normal.  Neck:     Thyroid: No thyromegaly.  Cardiovascular:     Rate and Rhythm: Normal rate and regular rhythm.     Heart sounds: Normal heart sounds. No murmur heard. Pulmonary:     Effort: Pulmonary effort is normal. No respiratory distress.     Breath sounds: Normal breath sounds. No wheezing.  Musculoskeletal:        General: Normal range of motion.     Cervical back: Neck supple.  Lymphadenopathy:     Cervical: No cervical adenopathy.  Skin:    General: Skin is warm and dry.     Findings: No rash.  Neurological:     Mental Status: He is alert and oriented to person, place, and time.     Coordination: Coordination normal.  Psychiatric:        Behavior: Behavior normal.    Results for orders placed or performed in visit on 07/14/20  IBC panel  Result Value Ref Range   Iron 261 (H) 42 - 165 ug/dL   Transferrin 236.0 212.0 - 360.0 mg/dL   Saturation Ratios 79.0 (H) 20.0 - 50.0 %  CBC with Differential/Platelet  Result Value Ref Range   WBC 8.6 4.0 - 10.5 K/uL   RBC 3.49 (L) 4.22 - 5.81 Mil/uL   Hemoglobin 10.8 (L) 13.0 - 17.0 g/dL   HCT 32.9 (L) 39.0 - 52.0  %   MCV 94.2 78.0 - 100.0 fl   MCHC 33.0 30.0 - 36.0 g/dL   RDW 15.9 (H) 11.5 - 15.5 %   Platelets 225.0 150.0 - 400.0 K/uL   Neutrophils Relative % 72.0 43.0 - 77.0 %   Lymphocytes Relative 12.1 12.0 - 46.0 %   Monocytes Relative 12.5 (H) 3.0 - 12.0 %   Eosinophils Relative 2.3 0.0 - 5.0 %   Basophils Relative 1.1 0.0 - 3.0 %   Neutro Abs 6.2 1.4 - 7.7 K/uL   Lymphs Abs 1.0 0.7 - 4.0 K/uL   Monocytes Absolute 1.1 (H) 0.1 - 1.0 K/uL   Eosinophils Absolute 0.2 0.0 - 0.7 K/uL   Basophils Absolute 0.1 0.0 - 0.1 K/uL  Prolactin  Result Value Ref Range   Prolactin 291.6 (H) 2.0 - 18.0 ng/mL  T4, free  Result Value Ref Range   Free T4 1.00 0.60 - 1.60 ng/dL  TSH  Result Value Ref Range   TSH 1.58 0.35 - 4.50 uIU/mL   *Note: Due to a large number of results and/or encounters for the requested time period, some results have not been displayed. A complete set of results can be found in Results Review.    Assessment & Plan:   Problem List Items Addressed This Visit       Cardiovascular and Mediastinum   Afib (HCC)     Respiratory   COPD (chronic obstructive pulmonary disease) (HCC)     Endocrine   Hypothyroidism - Primary     Genitourinary   BPH (benign prostatic hyperplasia)     Other   Hyperlipidemia LDL goal <70   Other Visit Diagnoses     Mixed stress and urge urinary incontinence           Patient sees endocrinology for her thyroid and just had blood work recently and is doing well.  He says occasionally he will get a palpitation but is doing well with A. fib.  Patient has had recurrent issues with mixed incontinence and wears depends currently and  would like to try condom catheters Follow up plan: Return in about 6 months (around 01/16/2021), or if symptoms worsen or fail to improve, for Hyperlipidemia and thyroid..  Counseling provided for all of the vaccine components No orders of the defined types were placed in this encounter.   Caryl Pina,  MD [facility] 07/16/2020, 11:35 AM

## 2020-07-17 NOTE — Telephone Encounter (Signed)
Pt calling regarding last message to f/u he ahs missed the call

## 2020-07-17 NOTE — Telephone Encounter (Signed)
Message left for patient to return my call.  

## 2020-07-20 ENCOUNTER — Encounter: Payer: Self-pay | Admitting: *Deleted

## 2020-07-20 ENCOUNTER — Telehealth: Payer: Self-pay | Admitting: *Deleted

## 2020-07-20 ENCOUNTER — Other Ambulatory Visit: Payer: Self-pay | Admitting: Endocrinology

## 2020-07-20 DIAGNOSIS — D352 Benign neoplasm of pituitary gland: Secondary | ICD-10-CM

## 2020-07-20 DIAGNOSIS — D353 Benign neoplasm of craniopharyngeal duct: Secondary | ICD-10-CM

## 2020-07-20 NOTE — Telephone Encounter (Signed)
[name] called wishing to know what his lifting weight limit is. Patient advised to not lift greater than 35lb. Patient states he is trying to stay physically activity by doing knee bends in the morning while holding on to a piece of furniture. Advised patient that was okay but to take it slow and Kyrell't over exert himself. Patient verbalized understanding. No further questions.

## 2020-07-20 NOTE — Telephone Encounter (Signed)
Message sent thru MyChart 

## 2020-07-20 NOTE — Telephone Encounter (Signed)
Ok, you will receive a phone call, about a day and time for an appointment 

## 2020-07-23 ENCOUNTER — Ambulatory Visit: Payer: Medicare Other | Admitting: Internal Medicine

## 2020-07-24 ENCOUNTER — Other Ambulatory Visit: Payer: Self-pay

## 2020-07-24 ENCOUNTER — Other Ambulatory Visit: Payer: Medicare Other

## 2020-07-24 DIAGNOSIS — L905 Scar conditions and fibrosis of skin: Secondary | ICD-10-CM | POA: Diagnosis not present

## 2020-07-24 DIAGNOSIS — D649 Anemia, unspecified: Secondary | ICD-10-CM | POA: Diagnosis not present

## 2020-07-24 DIAGNOSIS — C44529 Squamous cell carcinoma of skin of other part of trunk: Secondary | ICD-10-CM | POA: Diagnosis not present

## 2020-07-24 LAB — CBC WITH DIFFERENTIAL/PLATELET
Basophils Absolute: 0.1 10*3/uL (ref 0.0–0.2)
Basos: 1 %
EOS (ABSOLUTE): 0.2 10*3/uL (ref 0.0–0.4)
Eos: 2 %
Hematocrit: 31.9 % — ABNORMAL LOW (ref 37.5–51.0)
Hemoglobin: 10.4 g/dL — ABNORMAL LOW (ref 13.0–17.7)
Immature Grans (Abs): 0.2 10*3/uL — ABNORMAL HIGH (ref 0.0–0.1)
Immature Granulocytes: 2 %
Lymphocytes Absolute: 1.3 10*3/uL (ref 0.7–3.1)
Lymphs: 13 %
MCH: 30.5 pg (ref 26.6–33.0)
MCHC: 32.6 g/dL (ref 31.5–35.7)
MCV: 94 fL (ref 79–97)
Monocytes Absolute: 1.7 10*3/uL — ABNORMAL HIGH (ref 0.1–0.9)
Monocytes: 17 %
Neutrophils Absolute: 6.3 10*3/uL (ref 1.4–7.0)
Neutrophils: 65 %
Platelets: 252 10*3/uL (ref 150–450)
RBC: 3.41 x10E6/uL — ABNORMAL LOW (ref 4.14–5.80)
RDW: 14.1 % (ref 11.6–15.4)
WBC: 9.9 10*3/uL (ref 3.4–10.8)

## 2020-07-28 ENCOUNTER — Encounter (HOSPITAL_COMMUNITY): Payer: Self-pay | Admitting: Internal Medicine

## 2020-07-28 ENCOUNTER — Other Ambulatory Visit (HOSPITAL_COMMUNITY): Payer: Medicare Other

## 2020-07-30 ENCOUNTER — Encounter: Payer: Self-pay | Admitting: Internal Medicine

## 2020-07-30 ENCOUNTER — Ambulatory Visit: Payer: Medicare Other | Admitting: Internal Medicine

## 2020-07-30 ENCOUNTER — Encounter: Payer: Self-pay | Admitting: *Deleted

## 2020-07-30 VITALS — BP 120/78 | HR 88 | Ht 64.0 in | Wt 163.1 lb

## 2020-07-30 DIAGNOSIS — D638 Anemia in other chronic diseases classified elsewhere: Secondary | ICD-10-CM

## 2020-07-30 DIAGNOSIS — K5909 Other constipation: Secondary | ICD-10-CM

## 2020-07-30 DIAGNOSIS — K2100 Gastro-esophageal reflux disease with esophagitis, without bleeding: Secondary | ICD-10-CM | POA: Diagnosis not present

## 2020-07-30 NOTE — Patient Instructions (Addendum)
Continue pantoprazole twice daily dosing.  Continue sucralfate. Make it into a "slurry" by dissolving the tablet in 10 ml of warm water and mixing it up. Then, drink solution twice daily as needed.  Take Miralax every day.  You may take dulcolax as needed for constipation.  We have given you orders for labwork to be completed at [facility]: [address], [place] [address] Phone [phone]  Have these labs completed around 09/01/20.   If you are age 85 or older, your body mass index should be between 23-30. Your Body mass index is 28 kg/m. If this is out of the aforementioned range listed, please consider follow up with your Primary Care Provider. __________________________________________________________  The [facility] providers would like to encourage you to use [facility] to communicate with providers for non-urgent requests or questions.  Due to long hold times on the telephone, sending your provider a message by [facility] may be a faster and more efficient way to get a response.  Please allow 48 business hours for a response.  Please remember that this is for non-urgent requests.   Due to recent changes in healthcare laws, you may see the results of your imaging and laboratory studies on MyChart before your provider has had a chance to review them.  We understand that in some cases there may be results that are confusing or concerning to you. Not all laboratory results come back in the same time frame and the provider may be waiting for multiple results in order to interpret others.  Please give [place] 48 hours in order for your provider to thoroughly review all the results before contacting the office for clarification of your results.

## 2020-07-30 NOTE — Progress Notes (Signed)
Subjective:    Patient ID: [name], male    DOB: 05-09-1932, 87 y.o.   MRN: [id number]  HPI [name] is an 87 year old male with a recent hospitalization for GI bleed versus hemoptysis in the setting of ulcerative esophagitis, history of remote Barrett's esophagus, esophageal stricture, hiatal hernia repair with recurrence, chronic constipation, bronchiectasis, atrial fibrillation, ascending aortic aneurysm, prolactinoma who is seen for follow-up.  He is here today with his friend.  He reports that he is doing better since being hospitalized where an upper endoscopy and colonoscopy were performed on 06/13/2020. EGD revealed grade D esophagitis, 2 cm hiatal hernia and localized granular mucosa with fibrin debris on the posterior wall of the gastric body.  Gastric biopsies showed reactive gastropathy with ulcer without H. pylori.  No metaplasia or dysplasia. Colonoscopy revealed a 2 mm ascending colon polyp removed and found to be an adenoma, diverticulosis in the left colon and less than good prep.  Since hospitalization he has been taking his pantoprazole 40 mg twice daily and using sucralfate on average twice daily.  He also has been taking oral iron.  He notes that his swallowing has definitively improved and he is not frequently having heartburn.  He is having occasional "queasiness" and belching which he notices with certain foods and magnesium tablets.  He is not having dysphagia to food though will occasionally have dysphagia with some pills.  He is having increased constipation since adding oral iron.  He is using MiraLAX but not daily and Dulcolax tablets and suppositories as needed.  The suppositories do work well for him.  He is doing suppository less than once per week.  In the past he has used fleets enemas but not in the last 6 weeks.  He has had no blood in stool or melena.  He does occasionally have issues with right lower abdominal wall hernia but not pain.  He describes  bulging at times when he is active which reduces when he lies down.  He is followed up with Dr. Loanne Drilling and his prolactin levels have been elevated.  He continues bromocriptine.  He is also followed up with Dr. Loanne Drilling, pulmonary, and is being treated for bronchiectasis.  He completed doxycycline for 7 days.  He is using his flutter valve on a daily basis which he states is very helpful.  He has some dyspnea symptoms and issues with thick mucus if he does not use flutter valve regularly.   Review of Systems As per HPI, otherwise negative  Current Medications, Allergies, Past Medical History, Past Surgical History, Family History and Social History were reviewed in [employer] record.     Objective:   Physical Exam BP 120/78 (BP Location: Left Arm, Patient Position: Sitting, Cuff Size: Normal)   Pulse 88 Comment: irregular  Ht 5\' 4"  (1.626 m)   Wt 163 lb 2 oz (74 kg)   BMI 28.00 kg/m  Gen: awake, alert, NAD HEENT: anicteric CV: RRR, no mrg Pulm: CTA b/l Abd: soft, NT/ND, +BS throughout Ext: no c/c/e Neuro: nonfocal  CBC    Component Value Date/Time   WBC 9.9 07/24/2020 1311   WBC 8.6 07/14/2020 1102   RBC 3.41 (L) 07/24/2020 1311   RBC 3.49 (L) 07/14/2020 1102   HGB 10.4 (L) 07/24/2020 1311   HCT 31.9 (L) 07/24/2020 1311   PLT 252 07/24/2020 1311   MCV 94 07/24/2020 1311   MCH 30.5 07/24/2020 1311   MCH 32.0 06/13/2020 0513   MCHC  32.6 07/24/2020 1311   MCHC 33.0 07/14/2020 1102   RDW 14.1 07/24/2020 1311   LYMPHSABS 1.3 07/24/2020 1311   MONOABS 1.1 (H) 07/14/2020 1102   EOSABS 0.2 07/24/2020 1311   BASOSABS 0.1 07/24/2020 1311   Iron/TIBC/Ferritin/ %Sat    Component Value Date/Time   IRON 261 (H) 07/14/2020 1102   TIBC 267 06/12/2020 0501   FERRITIN 44 06/12/2020 0501   IRONPCTSAT 79.0 (H) 07/14/2020 1102   IRONPCTSAT 12 (L) 06/10/2020 1443        Assessment & Plan:  87 year old male with a recent hospitalization for GI bleed  versus hemoptysis in the setting of ulcerative esophagitis, history of remote Barrett's esophagus, esophageal stricture, hiatal hernia repair with recurrence, chronic constipation, bronchiectasis, atrial fibrillation, ascending aortic aneurysm, prolactinoma who is seen for follow-up.   1.  GERD with esophagitis --it is likely that some portion of his recent bleeding was secondary to his esophagitis though there may have been a lung component as well.  We discussed the need for ongoing twice daily PPI.  His hemoglobin is slowly improving, see #2 --Continue pantoprazole 40 mg twice daily -- I told him how to make slurry from sucralfate which she can use 1-2 times daily as needed -- GERD diet and reflux precautions discussed today -- I do not think we need to repeat upper endoscopy at this time due to the improvement clinically and also relative risk of sedation given his significant COPD and bronchiectasis  2.  Anemia --multifactorial and more chronic disease by iron studies.  However given his recent bleeding there is likely an iron deficiency component.  He had a recent upper endoscopy and colonoscopy, see HPI. -- Continue oral iron daily for now -- Hemoglobin is slowly improving -- Repeat CBC plus iron studies around 08/28/2020  3.  Constipation --chronic but exacerbated by oral iron -- Change MiraLAX from as needed to 17 g daily -- He could still use Dulcolax tablets per box instruction or suppository on an as-needed basis -- Should improve further once we stop oral iron  4.  Bronchiectasis --following with pulmonary  Follow-up with me in August or September  30 minutes total spent today including patient facing time, coordination of care, reviewing medical history/procedures/pertinent radiology studies, and documentation of the encounter.

## 2020-08-03 ENCOUNTER — Encounter (HOSPITAL_COMMUNITY): Admission: RE | Payer: Self-pay | Source: Home / Self Care

## 2020-08-03 ENCOUNTER — Ambulatory Visit (HOSPITAL_COMMUNITY): Admission: RE | Admit: 2020-08-03 | Payer: Medicare Other | Source: Home / Self Care | Admitting: Internal Medicine

## 2020-08-03 SURGERY — ESOPHAGOGASTRODUODENOSCOPY (EGD) WITH PROPOFOL
Anesthesia: Monitor Anesthesia Care

## 2020-08-05 ENCOUNTER — Other Ambulatory Visit: Payer: Self-pay

## 2020-08-05 ENCOUNTER — Ambulatory Visit
Admission: RE | Admit: 2020-08-05 | Discharge: 2020-08-05 | Disposition: A | Discharge status: Home / Self Care | Payer: Medicare Other | Source: Ambulatory Visit | Attending: Endocrinology | Admitting: Endocrinology

## 2020-08-05 DIAGNOSIS — D352 Benign neoplasm of pituitary gland: Secondary | ICD-10-CM

## 2020-08-10 ENCOUNTER — Other Ambulatory Visit: Payer: Self-pay | Admitting: *Deleted

## 2020-08-10 ENCOUNTER — Encounter: Payer: Self-pay | Admitting: Family Medicine

## 2020-08-10 DIAGNOSIS — D649 Anemia, unspecified: Secondary | ICD-10-CM

## 2020-08-11 ENCOUNTER — Other Ambulatory Visit: Payer: Medicare Other

## 2020-08-11 ENCOUNTER — Other Ambulatory Visit: Payer: Self-pay

## 2020-08-11 DIAGNOSIS — D649 Anemia, unspecified: Secondary | ICD-10-CM | POA: Diagnosis not present

## 2020-08-11 LAB — HEMOGLOBIN, FINGERSTICK: Hemoglobin: 10.4 g/dL — ABNORMAL LOW (ref 12.6–17.7)

## 2020-08-12 NOTE — Progress Notes (Signed)
Pt aware of results 

## 2020-08-24 ENCOUNTER — Other Ambulatory Visit: Payer: Self-pay

## 2020-08-24 ENCOUNTER — Telehealth: Payer: Self-pay | Admitting: Family Medicine

## 2020-08-24 DIAGNOSIS — D649 Anemia, unspecified: Secondary | ICD-10-CM

## 2020-08-24 DIAGNOSIS — R5383 Other fatigue: Secondary | ICD-10-CM

## 2020-08-24 NOTE — Telephone Encounter (Signed)
Pt will come one day this week to have HgB fingerstick performed. He denies black stools or any rectal bleeding. He has been short winded and fatigued.  Future orders have been placed.

## 2020-08-25 ENCOUNTER — Other Ambulatory Visit: Payer: Self-pay | Admitting: Family Medicine

## 2020-08-25 ENCOUNTER — Other Ambulatory Visit: Payer: Medicare Other

## 2020-08-25 ENCOUNTER — Other Ambulatory Visit: Payer: Self-pay

## 2020-08-25 DIAGNOSIS — D649 Anemia, unspecified: Secondary | ICD-10-CM

## 2020-08-25 DIAGNOSIS — R5383 Other fatigue: Secondary | ICD-10-CM

## 2020-08-25 LAB — HEMOGLOBIN, FINGERSTICK: Hemoglobin: 14.5 g/dL (ref 12.6–17.7)

## 2020-08-26 ENCOUNTER — Ambulatory Visit: Payer: Medicare Other | Admitting: Cardiology

## 2020-08-26 LAB — IRON,TIBC AND FERRITIN PANEL
Ferritin: 57 ng/mL (ref 30–400)
Iron Saturation: 35 % (ref 15–55)
Iron: 110 ug/dL (ref 38–169)
Total Iron Binding Capacity: 315 ug/dL (ref 250–450)
UIBC: 205 ug/dL (ref 111–343)

## 2020-08-26 LAB — CBC WITH DIFFERENTIAL/PLATELET
Basophils Absolute: 0.2 10*3/uL (ref 0.0–0.2)
Basos: 2 %
EOS (ABSOLUTE): 0.3 10*3/uL (ref 0.0–0.4)
Eos: 3 %
Hematocrit: 37.8 % (ref 37.5–51.0)
Hemoglobin: 12.3 g/dL — ABNORMAL LOW (ref 13.0–17.7)
Immature Grans (Abs): 0.3 10*3/uL — ABNORMAL HIGH (ref 0.0–0.1)
Immature Granulocytes: 3 %
Lymphocytes Absolute: 1.2 10*3/uL (ref 0.7–3.1)
Lymphs: 10 %
MCH: 29.8 pg (ref 26.6–33.0)
MCHC: 32.5 g/dL (ref 31.5–35.7)
MCV: 92 fL (ref 79–97)
Monocytes Absolute: 1.4 10*3/uL — ABNORMAL HIGH (ref 0.1–0.9)
Monocytes: 12 %
Neutrophils Absolute: 8.1 10*3/uL — ABNORMAL HIGH (ref 1.4–7.0)
Neutrophils: 70 %
Platelets: 260 10*3/uL (ref 150–450)
RBC: 4.13 x10E6/uL — ABNORMAL LOW (ref 4.14–5.80)
RDW: 14.9 % (ref 11.6–15.4)
WBC: 11.5 10*3/uL — ABNORMAL HIGH (ref 3.4–10.8)

## 2020-09-02 ENCOUNTER — Telehealth: Payer: Self-pay | Admitting: Internal Medicine

## 2020-09-02 NOTE — Telephone Encounter (Signed)
Inbound call from patient. States he wants to let Dr. Hilarie Fredrickson know he had blood work done on 6/28 at [place] and if he needed to get them done again or will Dr. Hilarie Fredrickson view the results?

## 2020-09-02 NOTE — Telephone Encounter (Signed)
See note below and advise. 

## 2020-09-02 NOTE — Telephone Encounter (Signed)
I can see the labs, overall his hemoglobin is better as are his iron studies Continue current medications

## 2020-09-02 NOTE — Telephone Encounter (Signed)
Spoke with pt and he is aware. 

## 2020-09-09 ENCOUNTER — Ambulatory Visit (INDEPENDENT_AMBULATORY_CARE_PROVIDER_SITE_OTHER): Payer: Medicare Other | Admitting: Nurse Practitioner

## 2020-09-09 ENCOUNTER — Encounter: Payer: Self-pay | Admitting: Nurse Practitioner

## 2020-09-09 DIAGNOSIS — R059 Cough, unspecified: Secondary | ICD-10-CM

## 2020-09-09 MED ORDER — DOXYCYCLINE HYCLATE 100 MG PO TABS: 100.0000 mg | ORAL_TABLET | Freq: Two times a day (BID) | ORAL | 0 refills | Status: DC

## 2020-09-09 NOTE — Progress Notes (Signed)
   Virtual Visit  Note Due to COVID-19 pandemic this visit was conducted virtually. This visit type was conducted due to national recommendations for restrictions regarding the COVID-19 Pandemic (e.g. social distancing, sheltering in place) in an effort to limit this patient's exposure and mitigate transmission in our community. All issues noted in this document were discussed and addressed.  A physical exam was not performed with this format.  I connected with [name] on 09/09/20 at 3 PM by telephone and verified that I am speaking with the correct person using two identifiers. [name] is currently located at home during visit. The provider, Ivy Lynn, NP is located in their office at time of visit.  I discussed the limitations, risks, security and privacy concerns of performing an evaluation and management service by telephone and the availability of in person appointments. I also discussed with the patient that there may be a patient responsible charge related to this service. The patient expressed understanding and agreed to proceed.   History and Present Illness:  Cough This is a recurrent problem. The current episode started in the past 7 days. The problem has been gradually worsening. The problem occurs constantly. The cough is Productive of sputum. Associated symptoms include chills, nasal congestion, a sore throat and shortness of breath. Pertinent negatives include no chest pain or ear congestion. Nothing aggravates the symptoms. He has tried nothing for the symptoms.     Review of Systems  Constitutional:  Positive for chills.  HENT:  Positive for sore throat.   Respiratory:  Positive for cough and shortness of breath.   Cardiovascular:  Negative for chest pain.  All other systems reviewed and are negative.   Observations/Objective: Televisit patient not in distress.  Assessment and Plan: Unresolved cough, sore throat and low-grade fever, Patient is  unable to come to clinic, he reports he lives alone and unable to drive at this time.  Patient wanted to take leftover antibiotic that he did not finish.  Advised patient against using leftover medication, doxycycline 100 mg tablet by mouth sent to pharmacy.  Advised patient to use Tylenol for low-grade fever, and seek emergency care for worsening symptoms.  Provided information and education to patient, the same information was given to patient's daughter who promised to call patient in follow-up.  Follow Up Instructions: Follow-up with worsening unresolved symptoms.    I discussed the assessment and treatment plan with the patient. The patient was provided an opportunity to ask questions and all were answered. The patient agreed with the plan and demonstrated an understanding of the instructions.   The patient was advised to call back or seek an in-person evaluation if the symptoms worsen or if the condition fails to improve as anticipated.  The above assessment and management plan was discussed with the patient. The patient verbalized understanding of and has agreed to the management plan. Patient is aware to call the clinic if symptoms persist or worsen. Patient is aware when to return to the clinic for a follow-up visit. Patient educated on when it is appropriate to go to the emergency department.   Time call ended: 3:15 PM  I provided 15 minutes of  non face-to-face time during this encounter.    Ivy Lynn, NP

## 2020-09-09 NOTE — Assessment & Plan Note (Signed)
Unresolved cough, sore throat and low-grade fever, Patient is unable to come to clinic, he reports he lives alone and unable to drive at this time.  Patient wanted to take leftover antibiotic that he did not finish.  Advised patient against using leftover medication, doxycycline 100 mg tablet by mouth sent to pharmacy.  Advised patient to use Tylenol for low-grade fever, and seek emergency care for worsening symptoms.  Provided information and education to patient, the same information was given to patient's daughter who promised to call patient in follow-up.

## 2020-09-10 ENCOUNTER — Ambulatory Visit (INDEPENDENT_AMBULATORY_CARE_PROVIDER_SITE_OTHER): Payer: Medicare Other | Admitting: Family Medicine

## 2020-09-10 ENCOUNTER — Ambulatory Visit (INDEPENDENT_AMBULATORY_CARE_PROVIDER_SITE_OTHER): Payer: Medicare Other

## 2020-09-10 ENCOUNTER — Encounter: Payer: Self-pay | Admitting: Family Medicine

## 2020-09-10 VITALS — BP 134/85 | HR 86 | Temp 98.0°F

## 2020-09-10 DIAGNOSIS — R059 Cough, unspecified: Secondary | ICD-10-CM | POA: Diagnosis not present

## 2020-09-10 DIAGNOSIS — J989 Respiratory disorder, unspecified: Secondary | ICD-10-CM

## 2020-09-10 DIAGNOSIS — R0602 Shortness of breath: Secondary | ICD-10-CM | POA: Diagnosis not present

## 2020-09-10 DIAGNOSIS — J683 Other acute and subacute respiratory conditions due to chemicals, gases, fumes and vapors: Secondary | ICD-10-CM

## 2020-09-10 MED ORDER — ALBUTEROL SULFATE HFA 108 (90 BASE) MCG/ACT IN AERS: 2.0000 | INHALATION_SPRAY | Freq: Four times a day (QID) | RESPIRATORY_TRACT | 0 refills | Status: DC | PRN

## 2020-09-10 MED ORDER — DEXAMETHASONE 2 MG PO TABS: ORAL_TABLET | ORAL | 0 refills | Status: DC

## 2020-09-10 NOTE — Progress Notes (Signed)
BP 134/85   Pulse 86   Temp 98 F (36.7 C)   SpO2 96%    Subjective:   Patient ID: [name], male    DOB: 1932-06-12, 87 y.o.   MRN: [id number]  HPI: [name] is a 87 y.o. male presenting on 09/10/2020 for URI   HPI Patient is coming in today with cough and congestion and feeling like he is having difficulty breathing and he feels like he is getting phlegm down his chest and he is coughing up yellow phlegm and having a lot of sinus drainage and sputum.  He has been going on for at least a week and said he had virtual visit and was given doxycycline last night.  He is just worried because he is not getting better.  Relevant past medical, surgical, family and social history reviewed and updated as indicated. Interim medical history since our last visit reviewed. Allergies and medications reviewed and updated.  Review of Systems  Constitutional:  Positive for fever. Negative for chills.  HENT:  Positive for congestion, postnasal drip, rhinorrhea, sinus pressure, sneezing and sore throat. Negative for ear discharge, ear pain and voice change.   Eyes:  Negative for pain, discharge, redness and visual disturbance.  Respiratory:  Positive for cough and shortness of breath. Negative for chest tightness and wheezing.   Cardiovascular:  Negative for chest pain and leg swelling.  Musculoskeletal:  Negative for gait problem.  Skin:  Negative for rash.  All other systems reviewed and are negative.  Per HPI unless specifically indicated above       Objective:   BP 134/85   Pulse 86   Temp 98 F (36.7 C)   SpO2 96%   Wt Readings from Last 3 Encounters:  07/30/20 163 lb 2 oz (74 kg)  07/16/20 167 lb (75.8 kg)  07/14/20 163 lb 9.6 oz (74.2 kg)    Physical Exam Vitals and nursing note reviewed.  Constitutional:      General: He is not in acute distress.    Appearance: He is well-developed. He is not diaphoretic.  HENT:     Right Ear: Tympanic membrane, ear canal  and external ear normal.     Left Ear: Tympanic membrane, ear canal and external ear normal.     Nose: Mucosal edema and rhinorrhea present.     Right Sinus: Maxillary sinus tenderness present. No frontal sinus tenderness.     Left Sinus: Maxillary sinus tenderness present. No frontal sinus tenderness.     Mouth/Throat:     Pharynx: Uvula midline. Posterior oropharyngeal erythema present. No oropharyngeal exudate.     Tonsils: No tonsillar abscesses.  Eyes:     General: No scleral icterus.    Conjunctiva/sclera: Conjunctivae normal.  Neck:     Thyroid: No thyromegaly.  Cardiovascular:     Rate and Rhythm: Normal rate and regular rhythm.     Heart sounds: Normal heart sounds. No murmur heard. Pulmonary:     Effort: Pulmonary effort is normal. No respiratory distress.     Breath sounds: Rhonchi (Rhonchi throughout both sides of his lungs) present. No wheezing or rales.  Musculoskeletal:        General: Normal range of motion.     Cervical back: Neck supple.  Lymphadenopathy:     Cervical: No cervical adenopathy.  Skin:    General: Skin is warm and dry.     Findings: No rash.  Neurological:     Mental Status: He is alert  and oriented to person, place, and time.     Coordination: Coordination normal.  Psychiatric:        Behavior: Behavior normal.    Chest x-ray: Small amount of early consolidation on right lower lobe, more atelectasis.  Await final read from radiology.  Assessment & Plan:   Problem List Items Addressed This Visit   None Visit Diagnoses     Respiratory illness    -  Primary   Relevant Medications   albuterol (VENTOLIN HFA) 108 (90 Base) MCG/ACT inhaler   dexamethasone (DECADRON) 2 MG tablet   Other Relevant Orders   Novel Coronavirus, NAA ([facility])   DG Chest 2 View   Reactive airways dysfunction syndrome (HCC)       Relevant Medications   albuterol (VENTOLIN HFA) 108 (90 Base) MCG/ACT inhaler   dexamethasone (DECADRON) 2 MG tablet       Will await  COVID testing, in the meantime we will treat and likely reactive airways or asthma exacerbation, he has doxycycline prescribed yesterday recommended for him to pick that up and sent dexamethasone and albuterol for him to help clear up his lungs.  If he is not improved or worsens then please give [place] call back or go to the emergency department. Follow up plan: Return if symptoms worsen or fail to improve.  Counseling provided for all of the vaccine components Orders Placed This Encounter  Procedures   Novel Coronavirus, NAA ([facility])   DG Chest 2 View    Caryl Pina, MD [facility] 09/10/2020, 4:33 PM

## 2020-09-11 ENCOUNTER — Encounter: Payer: Self-pay | Admitting: Family Medicine

## 2020-09-11 LAB — SARS-COV-2, NAA 2 DAY TAT

## 2020-09-11 LAB — NOVEL CORONAVIRUS, NAA: SARS-CoV-2, NAA: DETECTED — AB

## 2020-09-15 ENCOUNTER — Telehealth: Payer: Self-pay | Admitting: Family Medicine

## 2020-09-15 NOTE — Telephone Encounter (Signed)
Pt wanted to make sure it was ok for him to go pick up his groceries and also if he could exercise.  Informed pt that he is still in quarantine today. So he should stay home. If he feels well tomorrow and has no symptoms he can go out into the public as long as he is wearing his mask.  Pt instructed to start slow with exercising and to take it day by day due to SOB. He understood.

## 2020-09-15 NOTE — Telephone Encounter (Signed)
Pt informed to quarantine for 5 days since testing positive. After that and he is feeling better, no fever he can go out in the public but should wear his mask for the next 5 days. Pt understood.

## 2020-09-17 ENCOUNTER — Ambulatory Visit (INDEPENDENT_AMBULATORY_CARE_PROVIDER_SITE_OTHER): Payer: Medicare Other | Admitting: Family Medicine

## 2020-09-17 ENCOUNTER — Encounter: Payer: Self-pay | Admitting: *Deleted

## 2020-09-17 VITALS — BP 125/80 | HR 77 | Temp 98.2°F

## 2020-09-17 DIAGNOSIS — U071 COVID-19: Secondary | ICD-10-CM | POA: Diagnosis not present

## 2020-09-17 DIAGNOSIS — R609 Edema, unspecified: Secondary | ICD-10-CM

## 2020-09-17 DIAGNOSIS — J1282 Pneumonia due to coronavirus disease 2019: Secondary | ICD-10-CM | POA: Diagnosis not present

## 2020-09-17 MED ORDER — LEVOFLOXACIN 750 MG PO TABS: 750.0000 mg | ORAL_TABLET | Freq: Every day | ORAL | 0 refills | Status: AC

## 2020-09-17 NOTE — Progress Notes (Addendum)
Acute Office Visit  Subjective:    Patient ID: [name], male    DOB: 1932-06-21, 87 y.o.   MRN: [id number]  Chief Complaint  Patient presents with   Cough   Covid Positive    07/14   Edema    Calf of both legs   Sinusitis    HPI Patient is in today for follow up of Covid. He was diagnosed on 09/10/20. CXR on 7/14 showed pneumonia. He has been taking steroids and doxycyline. He has 1 day left of each. He reports that he is feeling better. Reports his shortness of breath, cough, and congestion is improving. Denies fever or chills. He has been decreasing his use of albuterol, using 1-2x a day. He does reports some swelling to both feet and ankles. Report this is mild. He mainly notices it at the end of the day. Denies warmth or tenderness. Denies chest pain. He has been elevated his feet sometimes.   Past Medical History:  Diagnosis Date   Abnormality of gait 06/07/2013   Anemia    Aneurysm ([place])    on assending aorta, currently watching it, Dr Cyndia Bent   Anxiety    Arthritis    Asthma    Atrial fibrillation ([place])    B12 deficiency    Barrett's esophagus    CAD (coronary artery disease) 03/2006   3.0 x 20 mm TAXUS Perseus DES to the LAD; 01/2007  L main 30%, oLAD 50%, pLAD stent ok, CFX 80%, OM 60%, pRCA 60%, mRCA 70%, oPDA 90%; med rx    Cancer ([place])    skin cancer    Cataract    bilateral removal of cateracts   COLONIC POLYPS 07/17/2008   Qualifier: Diagnosis of  By: Lovette Cliche, CNA, Christy     Complication of anesthesia     no issues,but pt prefers spinal due to Pulmonary problems   COPD (chronic obstructive pulmonary disease) ([place])    oxygen  on standby in home.   Depression    Diverticulosis    Emphysema    Enlarged prostate with urinary retention    Esophageal stenosis    Gastric ulcer    GERD (gastroesophageal reflux disease)    Hiatal hernia    Hypothyroidism    Neuropathy    Osteoporosis    Pituitary macroadenoma ([facility])    Restless legs syndrome (RLS)     Tubular adenoma of colon    UGIB (upper gastrointestinal bleed) 03/2013   EGD w/ large ulcer at GE junction    Past Surgical History:  Procedure Laterality Date   ABDOMINAL HERNIA REPAIR   2008   BIOPSY  06/13/2020   Procedure: BIOPSY;  Surgeon: Montez Morita, Quillian Quince, MD;  [facility];  Service: Gastroenterology;;  gastric   CARDIAC CATHETERIZATION  01/2007   L main 30%, oLAD 50%,  pLAD stent ok, CFX 80%, OM 60%, pRCA 60%, mRCA 70%, oPDA 90%; med rx   cataract extraction both eyes     COLONOSCOPY     COLONOSCOPY WITH PROPOFOL N/A 06/13/2020   Procedure: COLONOSCOPY WITH PROPOFOL;  Surgeon: Harvel Quale, MD;  [facility];  Service: Gastroenterology;  Laterality: N/A;   CORONARY ANGIOPLASTY     CORONARY STENT PLACEMENT  03/2006   3.0 x 20 mm TAXUS Perseus DES to the LAD   CYSTOSCOPY N/A 06/10/2015   Procedure: CYSTOSCOPY FULGRATION OF BLEEDING,  electovapor resection;  Surgeon: Irine Seal, MD;  Location: [facility];  Service: Urology;  Laterality:  N/A;   CYSTOSCOPY WITH INSERTION OF UROLIFT N/A 06/01/2015   Procedure: CYSTOSCOPY WITH INSERTION OF UROLIFT x4;  Surgeon: Irine Seal, MD;  [facility];  Service: Urology;  Laterality: N/A;   ESOPHAGOGASTRODUODENOSCOPY N/A 04/20/2013   Procedure: ESOPHAGOGASTRODUODENOSCOPY (EGD);  Surgeon: Inda Castle, MD;  [facility];  Service: Endoscopy;  Laterality: N/A;   ESOPHAGOGASTRODUODENOSCOPY N/A 03/25/2016   Procedure: ESOPHAGOGASTRODUODENOSCOPY (EGD);  Surgeon: Irene Shipper, MD;  Location: [facility];  Service: Endoscopy;  Laterality: N/A;   ESOPHAGOGASTRODUODENOSCOPY (EGD) WITH PROPOFOL N/A 10/13/2015   Procedure: ESOPHAGOGASTRODUODENOSCOPY (EGD) WITH PROPOFOL;  Surgeon: Jerene Bears, MD;  [facility];  Service: Gastroenterology;  Laterality: N/A;   ESOPHAGOGASTRODUODENOSCOPY (EGD) WITH PROPOFOL N/A 06/13/2020   Procedure: ESOPHAGOGASTRODUODENOSCOPY (EGD) WITH PROPOFOL;  Surgeon:  Harvel Quale, MD;  [facility];  Service: Gastroenterology;  Laterality: N/A;   FOOT SURGERY  1994 left, 2002 right foot   bilateral foot reconstruciton   FOOT SURGERY     reconstruction of both feet- no retained hardware.   HERNIA REPAIR     HIATAL HERNIA REPAIR  01-04-2008   JOINT REPLACEMENT     MELANOMA EXCISION  2019   POLYPECTOMY     POLYPECTOMY  06/13/2020   Procedure: POLYPECTOMY;  Surgeon: Harvel Quale, MD;  [facility];  Service: Gastroenterology;;  ascending,    PROSTATE SURGERY     x 2   SAVORY DILATION N/A 03/25/2016   Procedure: SAVORY DILATION;  Surgeon: Irene Shipper, MD;  [facility];  Service: Endoscopy;  Laterality: N/A;   TOTAL HIP ARTHROPLASTY  07/21/2011   Procedure: TOTAL HIP ARTHROPLASTY ANTERIOR APPROACH;  Surgeon: Mauri Pole, MD;  [facility];  Service: Orthopedics;  Laterality: Left;   TOTAL HIP ARTHROPLASTY Right 09/07/2012   Procedure: RIGHT TOTAL HIP ARTHROPLASTY ANTERIOR APPROACH;  Surgeon: Mcarthur Rossetti, MD;  [facility];  Service: Orthopedics;  Laterality: Right;   TRANSURETHRAL RESECTION OF BLADDER NECK N/A 11/04/2015   Procedure: RESECTION OF BLADDER NECK;  Surgeon: Cleon Gustin, MD;  [facility];  Service: Urology;  Laterality: N/A;   TRANSURETHRAL RESECTION OF PROSTATE N/A 11/04/2015   Procedure: TRANSURETHRAL RESECTION OF THE PROSTATE (TURP); REMOVAL OF UROLIFT IMPLANTS X THREE;  Surgeon: Cleon Gustin, MD;  Location[facility];  Service: Urology;  Laterality: N/A;   UPPER GASTROINTESTINAL ENDOSCOPY  12/2013   Dr Hilarie Fredrickson, gastritis   VIDEO BRONCHOSCOPY Bilateral 02/01/2017   Procedure: VIDEO BRONCHOSCOPY WITHOUT FLUORO;  Surgeon: Tanda Rockers, MD;  [facility];  Service: Cardiopulmonary;  Laterality: Bilateral;    Family History  Problem Relation Age of Onset   Heart disease Sister    Heart failure Father    Other Mother        phebitis related to  Ovid's birth, he was 3 weeks old when she died   Colon cancer Neg Hx    Esophageal cancer Neg Hx    Rectal cancer Neg Hx    Stomach cancer Neg Hx    Colon polyps Neg Hx     Social History   Socioeconomic History   Marital status: Widowed    Spouse name: Not on file   Number of children: 2   Years of education: 14   Highest education level: Associate degree: occupational, Hotel manager, or vocational program  Occupational History   Occupation: retired  Tobacco Use   Smoking status: Never   Smokeless tobacco: Never  Media planner  Vaping Use: Never used  Substance and Sexual Activity   Alcohol use: No   Drug use: No   Sexual activity: Not Currently    Birth control/protection: None  Other Topics Concern   Not on file  Social History Narrative   Patient drinks caffeine a few times a week.   Patient is right handed.   Social Determinants of Health   Financial Resource Strain: Low Risk    Difficulty of Paying Living Expenses: Not hard at all  Food Insecurity: No Food Insecurity   Worried About Charity fundraiser in the Last Year: Never true   [place] in the Last Year: Never true  Transportation Needs: No Transportation Needs   Lack of Transportation (Medical): No   Lack of Transportation (Non-Medical): No  Physical Activity: Insufficiently Active   Days of Exercise per Week: 5 days   Minutes of Exercise per Session: 20 min  Stress: No Stress Concern Present   Feeling of Stress : Not at all  Social Connections: Moderately Integrated   Frequency of Communication with Friends and Family: More than three times a week   Frequency of Social Gatherings with Friends and Family: More than three times a week   Attends Religious Services: More than 4 times per year   Active Member of [employer] or Organizations: Yes   Attends Archivist Meetings: More than 4 times per year   Marital Status: Widowed  Human resources officer Violence: Not At Risk   Fear of Current or  Ex-Partner: No   Emotionally Abused: No   Physically Abused: No   Sexually Abused: No    Outpatient Medications Prior to Visit  Medication Sig Dispense Refill   albuterol (PROVENTIL HFA;VENTOLIN HFA) 108 (90 Base) MCG/ACT inhaler Inhale 2 puffs into the lungs every 6 (six) hours as needed for wheezing or shortness of breath. 18 g 2   albuterol (VENTOLIN HFA) 108 (90 Base) MCG/ACT inhaler Inhale 2 puffs into the lungs every 6 (six) hours as needed for wheezing or shortness of breath. 8 g 0   aspirin EC 81 MG tablet Take 1 tablet (81 mg total) by mouth daily. Swallow whole. 90 tablet 3   bisacodyl (DULCOLAX) 10 MG suppository Place 10 mg rectally as needed for moderate constipation.     bisacodyl (DULCOLAX) 5 MG EC tablet Take 5 mg by mouth every other day.     bisacodyl (FLEET) 10 MG/30ML ENEM Place 10 mg rectally daily as needed (severe constipation).     bromocriptine (PARLODEL) 2.5 MG tablet Take 0.5 tablets (1.25 mg total) by mouth 2 (two) times daily. 90 tablet 3   budesonide-formoterol (SYMBICORT) 160-4.5 MCG/ACT inhaler Inhale 2 puffs into the lungs in the morning and at bedtime. 10.2 g 6   cholecalciferol (VITAMIN D) 1000 UNITS tablet Take 1,000 Units by mouth daily.      dexamethasone (DECADRON) 2 MG tablet Take 4 tablets for 3 days then 3 tablets for 3 days then 2 tablets for 3 days then 1 tablet for 3 days 30 tablet 0   diclofenac Sodium (VOLTAREN) 1 % GEL Apply 2 g topically 4 (four) times daily. (Patient taking differently: Apply 2 g topically 4 (four) times daily as needed (pain).) 50 g 1   doxycycline (VIBRA-TABS) 100 MG tablet Take 1 tablet (100 mg total) by mouth 2 (two) times daily. 14 tablet 0   ferrous sulfate 325 (65 FE) MG tablet Take 1 tablet (325 mg total) by mouth daily with  breakfast. (Patient taking differently: Take 325 mg by mouth every other day.) 90 tablet 3   GAVILAX 17 GM/SCOOP powder DISSOLVE 17 GM SCOOP IN 8 OZ. OF WATER AND DRINK ONCE DAILY AS NEEDED FOR  CONSTIPATION (Patient taking differently: Take 17 g by mouth. Every 3-4 days) 510 g 0   guaiFENesin (MUCINEX) 600 MG 12 hr tablet Take 600 mg by mouth daily.     levothyroxine (SYNTHROID) 75 MCG tablet Take 1 tablet (75 mcg total) by mouth daily. (Patient taking differently: Take 75 mcg by mouth daily before breakfast.) 90 tablet 3   magnesium oxide (MAG-OX) 400 (241.3 Mg) MG tablet Take 1 tablet (400 mg total) by mouth 2 (two) times daily. (Patient taking differently: Take 400 mg by mouth daily.) 15 tablet 0   naphazoline-pheniramine (NAPHCON-A) 0.025-0.3 % ophthalmic solution Place 1 drop into the left eye 2 (two) times daily as needed for irritation or allergies.     nitroGLYCERIN (NITROSTAT) 0.4 MG SL tablet Place 1 tablet (0.4 mg total) under the tongue every 5 (five) minutes as needed for chest pain. 25 tablet 2   pantoprazole (PROTONIX) 40 MG tablet Take 1 tablet (40 mg total) by mouth 2 (two) times daily. 180 tablet 0   Respiratory Therapy Supplies (FLUTTER) DEVI Twice a day and prn as needed, may increase if feeling worse 1 each 0   sodium chloride HYPERTONIC 3 % nebulizer solution Take by nebulization as needed for other. (Patient taking differently: Take 4 mLs by nebulization daily.) 750 mL 12   sucralfate (CARAFATE) 1 g tablet Take 1 tablet three times daily as needed (Patient taking differently: Take 1 g by mouth 3 (three) times daily.) 270 tablet 3   tamsulosin (FLOMAX) 0.4 MG CAPS capsule Take 1 capsule (0.4 mg total) by mouth daily after supper. 90 capsule 3   triamcinolone cream (KENALOG) 0.1 % Apply 1 application topically 2 (two) times daily. (Patient taking differently: Apply 1 application topically 2 (two) times daily as needed (itching).) 30 g 0   vitamin B-12 (CYANOCOBALAMIN) 1000 MCG tablet Take 1,000 mcg by mouth daily.     vitamin E 180 MG (400 UNITS) capsule Take 400 Units by mouth daily.     No facility-administered medications prior to visit.    Allergies  Allergen  Reactions   Betadine [Povidone Iodine] Other (See Comments)    Blisters    Gadavist [Gadobutrol] Hives   Lortab [Hydrocodone-Acetaminophen] Nausea And Vomiting   Penicillins Other (See Comments)    Lightheadedness  Has patient had a PCN reaction causing immediate rash, facial/tongue/throat swelling, SOB or lightheadedness with hypotension: Yes Has patient had a PCN reaction causing severe rash involving mucus membranes or skin necrosis: No Has patient had a PCN reaction that required hospitalization No Has patient had a PCN reaction occurring within the last 10 years: No If all of the above answers are "NO", then may proceed with Cephalosporin use.   Zetia [Ezetimibe] Other (See Comments)    Muscle weakness    Zocor [Simvastatin] Nausea Only and Other (See Comments)    Muscle weakness     Review of Systems As per HPI.    Objective:    Physical Exam Vitals and nursing note reviewed.  Constitutional:      General: He is not in acute distress.    Appearance: He is not ill-appearing, toxic-appearing or diaphoretic.  HENT:     Head: Normocephalic and atraumatic.  Cardiovascular:     Rate and Rhythm: Normal rate and  regular rhythm.     Heart sounds: Normal heart sounds. No murmur heard. Pulmonary:     Effort: Pulmonary effort is normal. No respiratory distress.     Breath sounds: No stridor. Rhonchi present. No wheezing or rales.  Chest:     Chest wall: No tenderness.  Musculoskeletal:     Right lower leg: Edema (1+ non pitting) present.     Left lower leg: Edema (1+ non pitting) present.  Skin:    General: Skin is warm and dry.     Findings: No erythema or rash.  Neurological:     General: No focal deficit present.     Mental Status: He is alert and oriented to person, place, and time.  Psychiatric:        Mood and Affect: Mood normal.        Behavior: Behavior normal.    BP 125/80   Pulse 77   Temp 98.2 F (36.8 C) (Temporal)   SpO2 95%  Wt Readings from Last 3  Encounters:  07/30/20 163 lb 2 oz (74 kg)  07/16/20 167 lb (75.8 kg)  07/14/20 163 lb 9.6 oz (74.2 kg)    Health Maintenance Due  Topic Date Due   Zoster Vaccines- Shingrix (1 of 2) Never done   COVID-19 Vaccine (4 - Booster for Moderna series) 07/29/2020    There are no preventive care reminders to display for this patient.   Lab Results  Component Value Date   TSH 1.58 07/14/2020   Lab Results  Component Value Date   WBC 11.5 (H) 08/25/2020   HGB 12.3 (L) 08/25/2020   HCT 37.8 08/25/2020   MCV 92 08/25/2020   PLT 260 08/25/2020   Lab Results  Component Value Date   NA 137 06/13/2020   K 3.7 06/13/2020   CO2 23 06/13/2020   GLUCOSE 90 06/13/2020   BUN 13 06/13/2020   CREATININE 1.01 06/13/2020   BILITOT 0.8 06/12/2020   ALKPHOS 53 06/12/2020   AST 25 06/12/2020   ALT 19 06/12/2020   PROT 5.3 (L) 06/12/2020   ALBUMIN 2.9 (L) 06/12/2020   CALCIUM 7.9 (L) 06/13/2020   ANIONGAP 7 06/13/2020   GFR 66.12 06/10/2020   Lab Results  Component Value Date   CHOL 170 04/22/2020   Lab Results  Component Value Date   HDL 51 04/22/2020   Lab Results  Component Value Date   LDLCALC 103 (H) 04/22/2020   Lab Results  Component Value Date   TRIG 84 04/22/2020   Lab Results  Component Value Date   CHOLHDL 3.3 04/22/2020   No results found for: HGBA1C     Assessment & Plan:   Trellis was seen today for cough, covid positive, edema and sinusitis.  Diagnoses and all orders for this visit:  Pneumonia due to COVID-19 virus Has been on doxycycline and steroids. Reeports improvement in symptoms. Lungs with rhonchi on exam today. Levaquin ordered as below. Continue symbicort and albuteorl as needed. Mucinex for cough and congestion. Stay well hydrated. Return to office for new or worsening symptoms, or if symptoms persist.  -     levofloxacin (LEVAQUIN) 750 MG tablet; Take 1 tablet (750 mg total) by mouth daily for 5 days.  Peripheral edema Discussed elevation,  hydration, rest.    Return to office for new or worsening symptoms, or if symptoms persist.   The patient indicates understanding of these issues and agrees with the plan.  Gwenlyn Perking, FNP

## 2020-09-18 ENCOUNTER — Telehealth: Payer: Self-pay | Admitting: Family Medicine

## 2020-09-18 NOTE — Telephone Encounter (Signed)
If no symptoms, pt is ok at this point. He is aware

## 2020-09-18 NOTE — Telephone Encounter (Signed)
Spoke with patient, two week followup of pneumonia scheduled with Dr. Warrick Parisian on 10/07/20 at 10:25 am.

## 2020-09-22 ENCOUNTER — Ambulatory Visit (INDEPENDENT_AMBULATORY_CARE_PROVIDER_SITE_OTHER): Payer: Medicare Other | Admitting: Nurse Practitioner

## 2020-09-22 ENCOUNTER — Encounter: Payer: Self-pay | Admitting: Nurse Practitioner

## 2020-09-22 DIAGNOSIS — K5909 Other constipation: Secondary | ICD-10-CM | POA: Diagnosis not present

## 2020-09-22 NOTE — Progress Notes (Signed)
   Virtual Visit  Note Due to COVID-19 pandemic this visit was conducted virtually. This visit type was conducted due to national recommendations for restrictions regarding the COVID-19 Pandemic (e.g. social distancing, sheltering in place) in an effort to limit this patient's exposure and mitigate transmission in our community. All issues noted in this document were discussed and addressed.  A physical exam was not performed with this format.  I connected with [name] on 09/22/20 at 3 pm by telephone and verified that I am speaking with the correct person using two identifiers. [name] is currently located at home during visit. The provider, Ivy Lynn, NP is located in their office at time of visit.  I discussed the limitations, risks, security and privacy concerns of performing an evaluation and management service by telephone and the availability of in person appointments. I also discussed with the patient that there may be a patient responsible charge related to this service. The patient expressed understanding and agreed to proceed.   History and Present Illness:  Constipation This is a chronic problem. The current episode started yesterday. The problem has been resolved since onset. The stool is described as formed. The patient is not on a high fiber diet. He Does not exercise regularly. There has Been adequate water intake. Associated symptoms include bloating and nausea. Pertinent negatives include no abdominal pain, difficulty urinating or fever. He has tried enemas and laxatives for the symptoms. The treatment provided significant relief.     Review of Systems  Constitutional:  Negative for chills and fever.  Gastrointestinal:  Positive for bloating, constipation and nausea. Negative for abdominal pain.  Genitourinary:  Negative for difficulty urinating.  Skin:  Negative for rash.  All other systems reviewed and are  negative.   Observations/Objective: Televisit patient not in distress  Assessment and Plan: Patient is reporting ongoing constipation and has not used MiraLAX, Dulcolax, and enema patient reports having a good bowel movement before phone call and he is doing well and will follow-up if he begins to have any more problems.  Education provided to patient to increase hydration, and incorporate vegetables and fruits in diet.  Follow Up Instructions: Follow-up with worsening symptoms of constipation.    I discussed the assessment and treatment plan with the patient. The patient was provided an opportunity to ask questions and all were answered. The patient agreed with the plan and demonstrated an understanding of the instructions.   The patient was advised to call back or seek an in-person evaluation if the symptoms worsen or if the condition fails to improve as anticipated.  The above assessment and management plan was discussed with the patient. The patient verbalized understanding of and has agreed to the management plan. Patient is aware to call the clinic if symptoms persist or worsen. Patient is aware when to return to the clinic for a follow-up visit. Patient educated on when it is appropriate to go to the emergency department.   Time call ended: 3:07 PM  I provided 7 minutes of  non face-to-face time during this encounter.    Ivy Lynn, NP

## 2020-09-22 NOTE — Assessment & Plan Note (Signed)
Patient is reporting ongoing constipation and has not used MiraLAX, Dulcolax, and enema patient reports having a good bowel movement before phone call and he is doing well and will follow-up if he begins to have any more problems.  Education provided to patient to increase hydration, and incorporate vegetables and fruits in diet.

## 2020-10-07 ENCOUNTER — Ambulatory Visit (INDEPENDENT_AMBULATORY_CARE_PROVIDER_SITE_OTHER): Payer: Medicare Other | Admitting: Family Medicine

## 2020-10-07 ENCOUNTER — Ambulatory Visit (INDEPENDENT_AMBULATORY_CARE_PROVIDER_SITE_OTHER): Payer: Medicare Other

## 2020-10-07 ENCOUNTER — Other Ambulatory Visit: Payer: Self-pay

## 2020-10-07 ENCOUNTER — Encounter: Payer: Self-pay | Admitting: Family Medicine

## 2020-10-07 VITALS — BP 110/67 | HR 81 | Ht 64.0 in | Wt 158.0 lb

## 2020-10-07 DIAGNOSIS — J1282 Pneumonia due to coronavirus disease 2019: Secondary | ICD-10-CM | POA: Diagnosis not present

## 2020-10-07 DIAGNOSIS — U071 COVID-19: Secondary | ICD-10-CM

## 2020-10-07 LAB — CBC WITH DIFFERENTIAL/PLATELET
Basophils Absolute: 0.1 10*3/uL (ref 0.0–0.2)
Basos: 2 %
EOS (ABSOLUTE): 0.3 10*3/uL (ref 0.0–0.4)
Eos: 4 %
Hematocrit: 37.5 % (ref 37.5–51.0)
Hemoglobin: 11.9 g/dL — ABNORMAL LOW (ref 13.0–17.7)
Immature Grans (Abs): 0.3 10*3/uL — ABNORMAL HIGH (ref 0.0–0.1)
Immature Granulocytes: 4 %
Lymphocytes Absolute: 1.2 10*3/uL (ref 0.7–3.1)
Lymphs: 18 %
MCH: 29.2 pg (ref 26.6–33.0)
MCHC: 31.7 g/dL (ref 31.5–35.7)
MCV: 92 fL (ref 79–97)
Monocytes Absolute: 0.9 10*3/uL (ref 0.1–0.9)
Monocytes: 13 %
Neutrophils Absolute: 4.1 10*3/uL (ref 1.4–7.0)
Neutrophils: 59 %
Platelets: 231 10*3/uL (ref 150–450)
RBC: 4.07 x10E6/uL — ABNORMAL LOW (ref 4.14–5.80)
RDW: 16.1 % — ABNORMAL HIGH (ref 11.6–15.4)
WBC: 7 10*3/uL (ref 3.4–10.8)

## 2020-10-07 NOTE — Progress Notes (Signed)
BP 110/67   Pulse 81   Ht '5\' 4"'$  (1.626 m)   Wt 158 lb (71.7 kg)   SpO2 96%   BMI 27.12 kg/m    Subjective:   Patient ID: [name], male    DOB: [birth year], 87 y.o.   MRN: [id number]  HPI: [name] is a 87 y.o. male presenting on 10/07/2020 for Pneumonia (2 week follow up)   HPI Patient coming in today for pneumonia follow-up He says he is feeling a lot better, still coughing some stuff up but his breathing is doing a lot better and is feeling a lot better.  Is coming in for follow-up today for that pneumonia.  He denies any fevers or chills. He denies any shortness of breath or wheezing.  Relevant past medical, surgical, family and social history reviewed and updated as indicated. Interim medical history since our last visit reviewed. Allergies and medications reviewed and updated.  Review of Systems  Constitutional:  Negative for chills and fever.  HENT:  Positive for congestion. Negative for sinus pressure, sinus pain, sneezing and sore throat.   Respiratory:  Positive for cough. Negative for shortness of breath and wheezing.   Cardiovascular:  Negative for chest pain and leg swelling.  Musculoskeletal:  Negative for back pain, gait problem and myalgias.  Skin:  Negative for rash.  All other systems reviewed and are negative.  Per HPI unless specifically indicated above   Allergies as of 10/07/2020       Reactions   Betadine [povidone Iodine] Other (See Comments)   Blisters    Gadavist [gadobutrol] Hives   Lortab [hydrocodone-acetaminophen] Nausea And Vomiting   Penicillins Other (See Comments)   Lightheadedness  Has patient had a PCN reaction causing immediate rash, facial/tongue/throat swelling, SOB or lightheadedness with hypotension: Yes Has patient had a PCN reaction causing severe rash involving mucus membranes or skin necrosis: No Has patient had a PCN reaction that required hospitalization No Has patient had a PCN reaction occurring within the  last 10 years: No If all of the above answers are "NO", then may proceed with Cephalosporin use.   Zetia [ezetimibe] Other (See Comments)   Muscle weakness    Zocor [simvastatin] Nausea Only, Other (See Comments)   Muscle weakness         Medication List        Accurate as of October 07, 2020 10:53 AM. If you have any questions, ask your nurse or doctor.          STOP taking these medications    doxycycline 100 MG tablet Commonly known as: VIBRA-TABS Stopped by: [name] Maisley Hainsworth, MD       TAKE these medications    albuterol 108 (90 Base) MCG/ACT inhaler Commonly known as: VENTOLIN HFA Inhale 2 puffs into the lungs every 6 (six) hours as needed for wheezing or shortness of breath.   albuterol 108 (90 Base) MCG/ACT inhaler Commonly known as: VENTOLIN HFA Inhale 2 puffs into the lungs every 6 (six) hours as needed for wheezing or shortness of breath.   aspirin EC 81 MG tablet Take 1 tablet (81 mg total) by mouth daily. Swallow whole.   bisacodyl 5 MG EC tablet Commonly known as: DULCOLAX Take 5 mg by mouth every other day.   bisacodyl 10 MG suppository Commonly known as: DULCOLAX Place 10 mg rectally as needed for moderate constipation.   bisacodyl 10 MG/30ML Enem Commonly known as: FLEET Place 10 mg rectally daily as needed (  severe constipation).   bromocriptine 2.5 MG tablet Commonly known as: PARLODEL Take 0.5 tablets (1.25 mg total) by mouth 2 (two) times daily.   budesonide-formoterol 160-4.5 MCG/ACT inhaler Commonly known as: Symbicort Inhale 2 puffs into the lungs in the morning and at bedtime.   cholecalciferol 1000 units tablet Commonly known as: VITAMIN D Take 1,000 Units by mouth daily.   dexamethasone 2 MG tablet Commonly known as: DECADRON Take 4 tablets for 3 days then 3 tablets for 3 days then 2 tablets for 3 days then 1 tablet for 3 days   diclofenac Sodium 1 % Gel Commonly known as: VOLTAREN Apply 2 g topically 4 (four) times  daily. What changed:  when to take this reasons to take this   ferrous sulfate 325 (65 FE) MG tablet Take 1 tablet (325 mg total) by mouth daily with breakfast. What changed: when to take this   Flutter Devi Twice a day and prn as needed, may increase if feeling worse   GaviLAX 17 GM/SCOOP powder Generic drug: polyethylene glycol powder DISSOLVE 17 GM SCOOP IN 8 OZ. OF WATER AND DRINK ONCE DAILY AS NEEDED FOR CONSTIPATION What changed: See the new instructions.   guaiFENesin 600 MG 12 hr tablet Commonly known as: MUCINEX Take 600 mg by mouth daily.   levothyroxine 75 MCG tablet Commonly known as: SYNTHROID Take 1 tablet (75 mcg total) by mouth daily. What changed: when to take this   magnesium oxide 400 (241.3 Mg) MG tablet Commonly known as: MAG-OX Take 1 tablet (400 mg total) by mouth 2 (two) times daily. What changed: when to take this   naphazoline-pheniramine 0.025-0.3 % ophthalmic solution Commonly known as: NAPHCON-A Place 1 drop into the left eye 2 (two) times daily as needed for irritation or allergies.   nitroGLYCERIN 0.4 MG SL tablet Commonly known as: NITROSTAT Place 1 tablet (0.4 mg total) under the tongue every 5 (five) minutes as needed for chest pain.   pantoprazole 40 MG tablet Commonly known as: PROTONIX Take 1 tablet (40 mg total) by mouth 2 (two) times daily.   sodium chloride HYPERTONIC 3 % nebulizer solution Take by nebulization as needed for other. What changed:  how much to take when to take this   sucralfate 1 g tablet Commonly known as: CARAFATE Take 1 tablet three times daily as needed What changed:  how much to take how to take this when to take this additional instructions   tamsulosin 0.4 MG Caps capsule Commonly known as: FLOMAX Take 1 capsule (0.4 mg total) by mouth daily after supper.   triamcinolone cream 0.1 % Commonly known as: KENALOG Apply 1 application topically 2 (two) times daily. What changed:  when to take  this reasons to take this   vitamin B-12 1000 MCG tablet Commonly known as: CYANOCOBALAMIN Take 1,000 mcg by mouth daily.   vitamin E 180 MG (400 UNITS) capsule Take 400 Units by mouth daily.         Objective:   BP 110/67   Pulse 81   Ht '5\' 4"'$  (1.626 m)   Wt 158 lb (71.7 kg)   SpO2 96%   BMI 27.12 kg/m   Wt Readings from Last 3 Encounters:  10/07/20 158 lb (71.7 kg)  07/30/20 163 lb 2 oz (74 kg)  07/16/20 167 lb (75.8 kg)    Physical Exam Vitals and nursing note reviewed.  Constitutional:      General: He is not in acute distress.    Appearance: He  is well-developed. He is not diaphoretic.  Eyes:     General: No scleral icterus.    Conjunctiva/sclera: Conjunctivae normal.  Neck:     Thyroid: No thyromegaly.  Cardiovascular:     Rate and Rhythm: Normal rate and regular rhythm.     Heart sounds: Normal heart sounds.  Pulmonary:     Effort: Pulmonary effort is normal. No respiratory distress.     Breath sounds: Normal breath sounds. No wheezing, rhonchi or rales.  Musculoskeletal:        General: Normal range of motion.     Cervical back: Neck supple.  Lymphadenopathy:     Cervical: No cervical adenopathy.  Skin:    General: Skin is warm and dry.     Findings: No rash.  Neurological:     Mental Status: He is alert and oriented to person, place, and time.     Coordination: Coordination normal.  Psychiatric:        Behavior: Behavior normal.    Chest x-ray, await final read from radiology  Assessment & Plan:   Problem List Items Addressed This Visit   None Visit Diagnoses     Pneumonia due to COVID-19 virus    -  Primary   Relevant Orders   DG Chest 2 View   CBC with Differential/Platelet     Will do repeat chest x-ray and CBC to see where his white counts are. Sounds like he is doing a lot better, we will follow-up after the x-ray has been read  Follow up plan: Return if symptoms worsen or fail to improve.  Counseling provided for all of the  vaccine components Orders Placed This Encounter  Procedures   DG Chest 2 View   CBC with Differential/Platelet    Caryl Pina, MD [facility] 10/07/2020, 10:53 AM

## 2020-10-09 ENCOUNTER — Encounter: Payer: Self-pay | Admitting: Family Medicine

## 2020-10-28 ENCOUNTER — Ambulatory Visit: Payer: Medicare Other | Admitting: Family Medicine

## 2020-11-02 ENCOUNTER — Encounter: Payer: Self-pay | Admitting: Family Medicine

## 2020-11-03 ENCOUNTER — Other Ambulatory Visit (INDEPENDENT_AMBULATORY_CARE_PROVIDER_SITE_OTHER): Payer: Medicare Other

## 2020-11-03 ENCOUNTER — Ambulatory Visit: Payer: Medicare Other | Admitting: Internal Medicine

## 2020-11-03 ENCOUNTER — Encounter: Payer: Self-pay | Admitting: Internal Medicine

## 2020-11-03 VITALS — BP 120/80 | HR 67 | Ht 64.0 in | Wt 159.0 lb

## 2020-11-03 DIAGNOSIS — D509 Iron deficiency anemia, unspecified: Secondary | ICD-10-CM

## 2020-11-03 DIAGNOSIS — K2100 Gastro-esophageal reflux disease with esophagitis, without bleeding: Secondary | ICD-10-CM

## 2020-11-03 DIAGNOSIS — K5909 Other constipation: Secondary | ICD-10-CM

## 2020-11-03 LAB — IBC + FERRITIN
Ferritin: 43.3 ng/mL (ref 22.0–322.0)
Iron: 48 ug/dL (ref 42–165)
Saturation Ratios: 14.5 % — ABNORMAL LOW (ref 20.0–50.0)
TIBC: 330.4 ug/dL (ref 250.0–450.0)
Transferrin: 236 mg/dL (ref 212.0–360.0)

## 2020-11-03 LAB — CBC
HCT: 38.6 % — ABNORMAL LOW (ref 39.0–52.0)
Hemoglobin: 12.6 g/dL — ABNORMAL LOW (ref 13.0–17.0)
MCHC: 32.6 g/dL (ref 30.0–36.0)
MCV: 89.9 fl (ref 78.0–100.0)
Platelets: 250 10*3/uL (ref 150.0–400.0)
RBC: 4.29 Mil/uL (ref 4.22–5.81)
RDW: 19.4 % — ABNORMAL HIGH (ref 11.5–15.5)
WBC: 9.8 10*3/uL (ref 4.0–10.5)

## 2020-11-03 MED ORDER — PANTOPRAZOLE SODIUM 40 MG PO TBEC: 40.0000 mg | DELAYED_RELEASE_TABLET | Freq: Two times a day (BID) | ORAL | 0 refills | Status: DC

## 2020-11-03 MED ORDER — SUCRALFATE 1 G PO TABS: ORAL_TABLET | ORAL | 3 refills | Status: DC

## 2020-11-03 NOTE — Progress Notes (Signed)
Subjective:    Patient ID: [name], male    DOB: [birth year], 87 y.o.   MRN: [id number]  HPI [name] is an 87 year old male with a history of GERD with esophagitis, history of Barrett's esophagus, esophageal stricture, hiatal hernia repair with recurrence, chronic constipation, bronchiectasis, A. fib, ascending aortic aneurysm and prolactinoma who is here for follow-up.  He is here alone today.  Since he was last here he reports he is feeling the best that he is felt in quite some time.  His reflux is under excellent control with pantoprazole which she is using 40 mg twice a day.  He is using Carafate slurry usually about twice per day around midday and late in the evening if he eats a snack.  He is not having heartburn or dysphagia symptom.  Appetite is good.  No nausea.  Bowel movements have been more regular and he has not needed MiraLAX or Dulcolax.  He is eating peaches and pears on a regular basis and this is helping with his regular bowel movement.  No melenic stools or blood in stool.  He is using iron every other day.  He has not seen any further blood with his phlegm/coughing.  He did have COVID-19 late in July and early August but recovered without incident.   Review of Systems As per HPI, otherwise negative  Current Medications, Allergies, Past Medical History, Past Surgical History, Family History and Social History were reviewed in [employer] record.    Objective:   Physical Exam BP 120/80   Pulse 67   Ht '5\' 4"'$  (1.626 m)   Wt 159 lb (72.1 kg)   SpO2 97%   BMI 27.29 kg/m  Gen: awake, alert, NAD HEENT: anicteric +BS throughout Ext: no c/c/e Neuro: nonfocal  CBC    Component Value Date/Time   WBC 7.0 10/07/2020 1111   WBC 8.6 07/14/2020 1102   RBC 4.07 (L) 10/07/2020 1111   RBC 3.49 (L) 07/14/2020 1102   HGB 11.9 (L) 10/07/2020 1111   HCT 37.5 10/07/2020 1111   PLT 231 10/07/2020 1111   MCV 92 10/07/2020 1111   MCH 29.2  10/07/2020 1111   MCH 32.0 06/13/2020 0513   MCHC 31.7 10/07/2020 1111   MCHC 33.0 07/14/2020 1102   RDW 16.1 (H) 10/07/2020 1111   LYMPHSABS 1.2 10/07/2020 1111   MONOABS 1.1 (H) 07/14/2020 1102   EOSABS 0.3 10/07/2020 1111   BASOSABS 0.1 10/07/2020 1111   Iron/TIBC/Ferritin/ %Sat    Component Value Date/Time   IRON 110 08/25/2020 1158   TIBC 315 08/25/2020 1158   FERRITIN 57 08/25/2020 1158   IRONPCTSAT 35 08/25/2020 1158   IRONPCTSAT 12 (L) 06/10/2020 1443        Assessment & Plan:  87 year old male with a history of GERD with esophagitis, history of Barrett's esophagus, esophageal stricture, hiatal hernia repair with recurrence, chronic constipation, bronchiectasis, A. fib, ascending aortic aneurysm and prolactinoma who is here for follow-up.    GERD with esophagitis/remote Barrett's esophagus --doing well continue current medications --Pantoprazole 40 mg twice daily AC --Sucralfate slurry 1-2 times daily as needed  2.  Anemia/IDA --repeat CBC and iron studies today, he will remain on oral iron every other day for now  3.  Constipation --improved of late with diet, he can use MiraLAX 17 g daily as needed  4.  Bronchiectasis --following with pulmonary, no further hemoptysis.  6 months follow-up if needed, okay to refill pantoprazole as this will be  a long-term medication for him  20 minutes total spent today including patient facing time, coordination of care, reviewing medical history/procedures/pertinent radiology studies, and documentation of the encounter.

## 2020-11-03 NOTE — Patient Instructions (Signed)
Your provider has requested that you go to the basement level for lab work before leaving today. Press "B" on the elevator. The lab is located at the first door on the left as you exit the elevator.  Due to recent changes in healthcare laws, you may see the results of your imaging and laboratory studies on MyChart before your provider has had a chance to review them.  We understand that in some cases there may be results that are confusing or concerning to you. Not all laboratory results come back in the same time frame and the provider may be waiting for multiple results in order to interpret others.  Please give [place] 48 hours in order for your provider to thoroughly review all the results before contacting the office for clarification of your results.   We have sent the following medications to your pharmacy for you to pick up at your convenience: Pantoprazole , Carafate   If you are age 85 or older, your body mass index should be between 23-30. Your Body mass index is 27.29 kg/m. If this is out of the aforementioned range listed, please consider follow up with your Primary Care Provider.  __________________________________________________________  The [facility] providers would like to encourage you to use [facility] to communicate with providers for non-urgent requests or questions.  Due to long hold times on the telephone, sending your provider a message by [facility] may be a faster and more efficient way to get a response.  Please allow 48 business hours for a response.  Please remember that this is for non-urgent requests.   Thank you for choosing me and [facility].  Dr.Jay Pyrtle

## 2020-11-09 ENCOUNTER — Other Ambulatory Visit: Payer: Self-pay | Admitting: Surgery

## 2020-11-09 DIAGNOSIS — I712 Thoracic aortic aneurysm, without rupture, unspecified: Secondary | ICD-10-CM

## 2020-11-19 ENCOUNTER — Encounter: Payer: Self-pay | Admitting: Family Medicine

## 2020-12-03 ENCOUNTER — Telehealth: Payer: Self-pay | Admitting: Pulmonary Disease

## 2020-12-03 NOTE — Telephone Encounter (Signed)
Spoke with pt who stated that for 2-3 weeks he has had a productive cough. Pt states he is not taking anything over the counter for cough and that rescue inhaler is not proving effective. Pt states using Symbicort as directed. Pt denies N/V/D/F/C. Offered pt OV tomorrow with Geraldo Pitter but pt states transportation would be a problem so pt was offered televisit  which he excepted. Pt was scheduled for televisit at 0900 on tomorrow 12/04/20 with Geraldo Pitter. Routing the encounter to [employer]  as Juluis Rainier. Nothing further needed at this time.

## 2020-12-04 ENCOUNTER — Ambulatory Visit (INDEPENDENT_AMBULATORY_CARE_PROVIDER_SITE_OTHER): Payer: Medicare Other | Admitting: Primary Care

## 2020-12-04 ENCOUNTER — Encounter: Payer: Self-pay | Admitting: Primary Care

## 2020-12-04 ENCOUNTER — Other Ambulatory Visit: Payer: Self-pay

## 2020-12-04 VITALS — BP 147/81 | HR 93 | Temp 97.9°F | Ht 64.0 in | Wt 159.0 lb

## 2020-12-04 DIAGNOSIS — J471 Bronchiectasis with (acute) exacerbation: Secondary | ICD-10-CM

## 2020-12-04 NOTE — Progress Notes (Signed)
Virtual Visit via Telephone Note  I connected with [name] on 12/04/20 at  9:00 AM EDT by telephone and verified that I am speaking with the correct person using two identifiers.  Location: Patient: Home Provider: Office   I discussed the limitations, risks, security and privacy concerns of performing an evaluation and management service by telephone and the availability of in person appointments. I also discussed with the patient that there may be a patient responsible charge related to this service. The patient expressed understanding and agreed to proceed.   History of Present Illness:  87 year old male, former smoker (quit 1980). PMH significant for COPD GOLD I, chronic bronchitis, bronchiectasis. Patient of Dr. Loanne Drilling, last seen on 06/18/20. Maintained on Symbicort 160 2 bid. Vest ordered but cancelled at patients request. Takes ciprofloxacin x 7 days for flares. Has needed to take it once, has refill available.   12/04/2020- Interim hx  Patient contacted today for acute televisit. He developed productive cough with lots of thick secretions. Sputum is white-brownish tint. He has been using flutter valve every 30 mins and using hypertonic saline nebulizer every other day. He has a refill of ciprofloxacin available and is wondering if he should take this.    Observations/Objective:  - Able to speak in full sentences; no overt shortness of breath or wheezing   Assessment and Plan:  Bronchiectasis with acute exacerbation: - Increased cough/congested with thick white-brown sputum  - Continue mucinex, flutter valve and hypertonic saline nebulizer daily - Advised for patient to fill Ciprofloxacin prescription and take as directed   Follow Up Instructions:   - As needed if symptoms do not improve; follow-up in 3 months with Dr. Loanne Drilling   I discussed the assessment and treatment plan with the patient. The patient was provided an opportunity to ask questions and all were answered.  The patient agreed with the plan and demonstrated an understanding of the instructions.   The patient was advised to call back or seek an in-person evaluation if the symptoms worsen or if the condition fails to improve as anticipated.  I provided 18 minutes of non-face-to-face time during this encounter.   Martyn Ehrich, NP

## 2020-12-09 ENCOUNTER — Ambulatory Visit: Payer: Medicare Other | Admitting: Surgery

## 2020-12-09 ENCOUNTER — Other Ambulatory Visit: Payer: Medicare Other

## 2021-01-11 ENCOUNTER — Other Ambulatory Visit: Payer: Self-pay | Admitting: Internal Medicine

## 2021-01-12 ENCOUNTER — Ambulatory Visit: Payer: Medicare Other | Admitting: Endocrinology

## 2021-01-13 ENCOUNTER — Encounter: Payer: Self-pay | Admitting: Family Medicine

## 2021-01-13 ENCOUNTER — Other Ambulatory Visit: Payer: Self-pay

## 2021-01-13 ENCOUNTER — Ambulatory Visit (INDEPENDENT_AMBULATORY_CARE_PROVIDER_SITE_OTHER): Payer: Medicare Other | Admitting: Family Medicine

## 2021-01-13 VITALS — BP 130/78 | HR 77 | Ht 64.0 in | Wt 164.0 lb

## 2021-01-13 DIAGNOSIS — K2100 Gastro-esophageal reflux disease with esophagitis, without bleeding: Secondary | ICD-10-CM

## 2021-01-13 DIAGNOSIS — E039 Hypothyroidism, unspecified: Secondary | ICD-10-CM | POA: Diagnosis not present

## 2021-01-13 DIAGNOSIS — E785 Hyperlipidemia, unspecified: Secondary | ICD-10-CM

## 2021-01-13 DIAGNOSIS — E221 Hyperprolactinemia: Secondary | ICD-10-CM

## 2021-01-13 DIAGNOSIS — Z23 Encounter for immunization: Secondary | ICD-10-CM | POA: Diagnosis not present

## 2021-01-13 DIAGNOSIS — J449 Chronic obstructive pulmonary disease, unspecified: Secondary | ICD-10-CM | POA: Diagnosis not present

## 2021-01-13 NOTE — Progress Notes (Signed)
BP 130/78   Pulse 77   Ht 5\' 4"  (1.626 m)   Wt 164 lb (74.4 kg)   SpO2 97%   BMI 28.15 kg/m    Subjective:   Patient ID: [name], male    DOB: 1932-12-24, 87 y.o.   MRN: [id number]  HPI: [name] is a 87 y.o. male presenting on 01/13/2021 for Medical Management of Chronic Issues, Hyperlipidemia, and Hypothyroidism   HPI Hypothyroidism recheck Patient is coming in for thyroid recheck today as well. They deny any issues with hair changes or heat or cold problems or diarrhea or constipation. They deny any chest pain or palpitations. They are currently on levothyroxine 75 micrograms   Hyperlipidemia Patient is coming in for recheck of his hyperlipidemia. The patient is currently taking no medicine currently, has been intolerant of multiple cholesterol medicines. They deny any issues with myalgias or history of liver damage from it. They deny any focal numbness or weakness or chest pain.   GERD Patient is currently on pantoprazole and sucralfate.  She denies any major symptoms or abdominal pain or belching or burping. She denies any blood in her stool or lightheadedness or dizziness.   COPD Patient is coming in for COPD recheck today.  He is currently on albuterol as needed, rarely uses.  He has a mild chronic cough but denies any major coughing spells or wheezing spells.  He has 0nighttime symptoms per week and 0daytime symptoms per week currently.   Relevant past medical, surgical, family and social history reviewed and updated as indicated. Interim medical history since our last visit reviewed. Allergies and medications reviewed and updated.  Review of Systems  Constitutional:  Negative for chills and fever.  Eyes:  Negative for visual disturbance.  Respiratory:  Negative for shortness of breath and wheezing.   Cardiovascular:  Negative for chest pain and leg swelling.  Musculoskeletal:  Positive for gait problem (Has some balance and gait issues but if he uses  cane he does fine.). Negative for back pain.  Skin:  Negative for rash.  Neurological:  Negative for dizziness, weakness and numbness.  All other systems reviewed and are negative.  Per HPI unless specifically indicated above   Allergies as of 01/13/2021       Reactions   Gadavist [gadobutrol] Hives   Betadine [povidone Iodine] Other (See Comments)   Blisters    Lortab [hydrocodone-acetaminophen] Nausea And Vomiting   Penicillins Other (See Comments)   Lightheadedness  Has patient had a PCN reaction causing immediate rash, facial/tongue/throat swelling, SOB or lightheadedness with hypotension: Yes Has patient had a PCN reaction causing severe rash involving mucus membranes or skin necrosis: No Has patient had a PCN reaction that required hospitalization No Has patient had a PCN reaction occurring within the last 10 years: No If all of the above answers are "NO", then may proceed with Cephalosporin use.   Zetia [ezetimibe] Other (See Comments)   Muscle weakness    Zocor [simvastatin] Nausea Only, Other (See Comments)   Muscle weakness         Medication List        Accurate as of January 13, 2021 11:56 AM. If you have any questions, ask your nurse or doctor.          albuterol 108 (90 Base) MCG/ACT inhaler Commonly known as: VENTOLIN HFA Inhale 2 puffs into the lungs every 6 (six) hours as needed for wheezing or shortness of breath.   aspirin EC 81 MG  tablet Take 1 tablet (81 mg total) by mouth daily. Swallow whole.   bisacodyl 10 MG suppository Commonly known as: DULCOLAX Place 10 mg rectally as needed for moderate constipation. What changed: Another medication with the same name was removed. Continue taking this medication, and follow the directions you see here. Changed by: [name] Serina Nichter, MD   bisacodyl 10 MG/30ML Enem Commonly known as: FLEET Place 10 mg rectally daily as needed (severe constipation). What changed: Another medication with the same name  was removed. Continue taking this medication, and follow the directions you see here. Changed by: Worthy Rancher, MD   bromocriptine 2.5 MG tablet Commonly known as: PARLODEL Take 0.5 tablets (1.25 mg total) by mouth 2 (two) times daily.   budesonide-formoterol 160-4.5 MCG/ACT inhaler Commonly known as: Symbicort Inhale 2 puffs into the lungs in the morning and at bedtime.   cholecalciferol 1000 units tablet Commonly known as: VITAMIN D Take 1,000 Units by mouth daily.   ciprofloxacin 500 MG tablet Commonly known as: CIPRO Take 500 mg by mouth 2 (two) times daily.   diclofenac Sodium 1 % Gel Commonly known as: VOLTAREN Apply 2 g topically 4 (four) times daily. What changed:  when to take this reasons to take this   ferrous sulfate 325 (65 FE) MG tablet Take 1 tablet (325 mg total) by mouth daily with breakfast. What changed: when to take this   Flutter Devi Twice a day and prn as needed, may increase if feeling worse   GaviLAX 17 GM/SCOOP powder Generic drug: polyethylene glycol powder DISSOLVE 17 GM SCOOP IN 8 OZ. OF WATER AND DRINK ONCE DAILY AS NEEDED FOR CONSTIPATION What changed: See the new instructions.   guaiFENesin 600 MG 12 hr tablet Commonly known as: MUCINEX Take 600 mg by mouth daily.   levothyroxine 75 MCG tablet Commonly known as: SYNTHROID Take 1 tablet (75 mcg total) by mouth daily. What changed: when to take this   magnesium oxide 400 (241.3 Mg) MG tablet Commonly known as: MAG-OX Take 1 tablet (400 mg total) by mouth 2 (two) times daily. What changed:  when to take this reasons to take this   naphazoline-pheniramine 0.025-0.3 % ophthalmic solution Commonly known as: NAPHCON-A Place 1 drop into the left eye 2 (two) times daily as needed for irritation or allergies.   nitroGLYCERIN 0.4 MG SL tablet Commonly known as: NITROSTAT Place 1 tablet (0.4 mg total) under the tongue every 5 (five) minutes as needed for chest pain.    pantoprazole 40 MG tablet Commonly known as: PROTONIX TAKE ONE TABLET TWICE DAILY   sodium chloride HYPERTONIC 3 % nebulizer solution Take by nebulization as needed for other. What changed:  how much to take when to take this   sucralfate 1 g tablet Commonly known as: CARAFATE Take 1 tablet three times daily as needed   tamsulosin 0.4 MG Caps capsule Commonly known as: FLOMAX Take 1 capsule (0.4 mg total) by mouth daily after supper.   triamcinolone cream 0.1 % Commonly known as: KENALOG Apply 1 application topically 2 (two) times daily. What changed:  when to take this reasons to take this   vitamin B-12 1000 MCG tablet Commonly known as: CYANOCOBALAMIN Take 1,000 mcg by mouth daily.   Vitamin C 500 MG Caps Take 1 capsule by mouth daily.   vitamin E 180 MG (400 UNITS) capsule Take 400 Units by mouth daily.         Objective:   BP 130/78   Pulse  77   Ht $R'5\' 4"'Nn$  (1.626 m)   Wt 164 lb (74.4 kg)   SpO2 97%   BMI 28.15 kg/m   Wt Readings from Last 3 Encounters:  01/13/21 164 lb (74.4 kg)  12/04/20 159 lb (72.1 kg)  11/03/20 159 lb (72.1 kg)    Physical Exam Vitals and nursing note reviewed.  Constitutional:      General: He is not in acute distress.    Appearance: He is well-developed. He is not diaphoretic.  Eyes:     General: No scleral icterus.    Conjunctiva/sclera: Conjunctivae normal.  Neck:     Thyroid: No thyromegaly.  Cardiovascular:     Rate and Rhythm: Normal rate and regular rhythm.     Heart sounds: Normal heart sounds. No murmur heard. Pulmonary:     Effort: Pulmonary effort is normal. No respiratory distress.     Breath sounds: Normal breath sounds. No wheezing.  Musculoskeletal:     Cervical back: Neck supple.  Lymphadenopathy:     Cervical: No cervical adenopathy.  Skin:    General: Skin is warm and dry.     Findings: No rash.  Neurological:     Mental Status: He is alert and oriented to person, place, and time.      Coordination: Coordination normal.  Psychiatric:        Behavior: Behavior normal.      Assessment & Plan:   Problem List Items Addressed This Visit       Respiratory   COPD (chronic obstructive pulmonary disease) ([place])   Relevant Orders   CBC with Differential/Platelet     Digestive   Gastroesophageal reflux disease with esophagitis   Relevant Orders   CMP14+EGFR   CBC with Differential/Platelet     Endocrine   Hypothyroidism - Primary   Relevant Orders   CMP14+EGFR   TSH   CBC with Differential/Platelet     Other   Hyperlipidemia LDL goal <70   Relevant Orders   Lipid panel    Recommend using cane all the time.  Even at home.  Will check blood work and levels. Follow up plan: Return in about 6 months (around 07/13/2021), or if symptoms worsen or fail to improve, for Thyroid and hypertension and COPD.  Counseling provided for all of the vaccine components Orders Placed This Encounter  Procedures   CMP14+EGFR   Lipid panel   TSH   CBC with Differential/Platelet     Caryl Pina, MD [facility] 01/13/2021, 11:56 AM

## 2021-01-13 NOTE — Addendum Note (Signed)
Addended by: Caryl Pina on: 01/13/2021 12:40 PM   Modules accepted: Orders

## 2021-01-14 LAB — CMP14+EGFR
ALT: 11 IU/L (ref 0–44)
AST: 20 IU/L (ref 0–40)
Albumin/Globulin Ratio: 1.7 (ref 1.2–2.2)
Albumin: 4.1 g/dL (ref 3.6–4.6)
Alkaline Phosphatase: 79 IU/L (ref 44–121)
BUN/Creatinine Ratio: 19 (ref 10–24)
BUN: 19 mg/dL (ref 8–27)
Bilirubin Total: 0.3 mg/dL (ref 0.0–1.2)
CO2: 21 mmol/L (ref 20–29)
Calcium: 9 mg/dL (ref 8.6–10.2)
Chloride: 102 mmol/L (ref 96–106)
Creatinine, Ser: 0.98 mg/dL (ref 0.76–1.27)
Globulin, Total: 2.4 g/dL (ref 1.5–4.5)
Glucose: 84 mg/dL (ref 70–99)
Potassium: 4.5 mmol/L (ref 3.5–5.2)
Sodium: 141 mmol/L (ref 134–144)
Total Protein: 6.5 g/dL (ref 6.0–8.5)
eGFR: 75 mL/min/{1.73_m2} (ref >59)

## 2021-01-14 LAB — CBC WITH DIFFERENTIAL/PLATELET
Basophils Absolute: 0.2 10*3/uL (ref 0.0–0.2)
Basos: 2 %
EOS (ABSOLUTE): 0.3 10*3/uL (ref 0.0–0.4)
Eos: 3 %
Hematocrit: 39.4 % (ref 37.5–51.0)
Hemoglobin: 13 g/dL (ref 13.0–17.7)
Immature Grans (Abs): 0.5 10*3/uL — ABNORMAL HIGH (ref 0.0–0.1)
Immature Granulocytes: 5 %
Lymphocytes Absolute: 1.2 10*3/uL (ref 0.7–3.1)
Lymphs: 13 %
MCH: 31.6 pg (ref 26.6–33.0)
MCHC: 33 g/dL (ref 31.5–35.7)
MCV: 96 fL (ref 79–97)
Monocytes Absolute: 1.2 10*3/uL — ABNORMAL HIGH (ref 0.1–0.9)
Monocytes: 13 %
Neutrophils Absolute: 6.2 10*3/uL (ref 1.4–7.0)
Neutrophils: 64 %
Platelets: 245 10*3/uL (ref 150–450)
RBC: 4.12 x10E6/uL — ABNORMAL LOW (ref 4.14–5.80)
RDW: 14.5 % (ref 11.6–15.4)
WBC: 9.5 10*3/uL (ref 3.4–10.8)

## 2021-01-14 LAB — LIPID PANEL
Chol/HDL Ratio: 3.2 ratio (ref 0.0–5.0)
Cholesterol, Total: 178 mg/dL (ref 100–199)
HDL: 55 mg/dL (ref >39)
LDL Chol Calc (NIH): 110 mg/dL — ABNORMAL HIGH (ref 0–99)
Triglycerides: 69 mg/dL (ref 0–149)
VLDL Cholesterol Cal: 13 mg/dL (ref 5–40)

## 2021-01-14 LAB — TSH: TSH: 2.96 u[IU]/mL (ref 0.450–4.500)

## 2021-01-14 LAB — PROLACTIN: Prolactin: 196 ng/mL — ABNORMAL HIGH (ref 4.0–15.2)

## 2021-01-15 ENCOUNTER — Telehealth: Payer: Self-pay | Admitting: Family Medicine

## 2021-01-15 NOTE — Telephone Encounter (Signed)
Pt calling about labs. Please review labs and call pt to go over.

## 2021-01-15 NOTE — Telephone Encounter (Signed)
Requesting lab results- please review and advise

## 2021-01-20 ENCOUNTER — Encounter: Payer: Self-pay | Admitting: Family Medicine

## 2021-02-05 ENCOUNTER — Ambulatory Visit (INDEPENDENT_AMBULATORY_CARE_PROVIDER_SITE_OTHER): Payer: Medicare Other | Admitting: Endocrinology

## 2021-02-05 ENCOUNTER — Other Ambulatory Visit: Payer: Self-pay

## 2021-02-05 ENCOUNTER — Encounter: Payer: Self-pay | Admitting: Endocrinology

## 2021-02-05 VITALS — BP 112/69 | HR 72

## 2021-02-05 DIAGNOSIS — D352 Benign neoplasm of pituitary gland: Secondary | ICD-10-CM

## 2021-02-05 DIAGNOSIS — D353 Benign neoplasm of craniopharyngeal duct: Secondary | ICD-10-CM

## 2021-02-05 NOTE — Patient Instructions (Addendum)
Let's recheck the MRI.  you will receive a phone call, about a day and time for an appointment.   Please continue the same bromocriptine and synthroid.   Please come back for a follow-up appointment in 6 months.

## 2021-02-05 NOTE — Progress Notes (Signed)
Subjective:    Patient ID: [name], male    DOB: 06-27-1932, 87 y.o.   MRN: [id number]  HPI telehealth visit today via telephone x 10 minutes.  Alternatives to telehealth are presented to this patient, and the patient agrees to the telehealth visit. Pt is advised of the cost of the visit, and agrees to this, also.   Patient is at home, and I am at the office.   Persons attending the telehealth visit: the patient and I.   Pt returns for f/u of: pituitary macroadenoma (found incidentally on MRI of the C-spine in 2009; he sees Dr Carloyn Manner for this; last MRI in 2020 was unchanged, but he had a rash after Gd infusion):  denies headache.  hyperprolactinemia: (dx'ed 2009; prob due to stalk compression; he was rx'ed parlodel, but dosage has been limited by dizziness; he also had this at minimal dosage of cabergoline).  Lightheadedness persists.   Chronic primary hypothyroidism:   He takes synthroid as rx'ed.   Past Medical History:  Diagnosis Date   Abnormality of gait 06/07/2013   Anemia    Aneurysm ([place])    on assending aorta, currently watching it, Dr Cyndia Bent   Anxiety    Arthritis    Asthma    Atrial fibrillation ([place])    B12 deficiency    Barrett's esophagus    CAD (coronary artery disease) 03/2006   3.0 x 20 mm TAXUS Perseus DES to the LAD; 01/2007  L main 30%, oLAD 50%, pLAD stent ok, CFX 80%, OM 60%, pRCA 60%, mRCA 70%, oPDA 90%; med rx    Cancer ([place])    skin cancer    Cataract    bilateral removal of cateracts   COLONIC POLYPS 07/17/2008   Qualifier: Diagnosis of  By: Lovette Cliche, CNA, Christy     Complication of anesthesia     no issues,but pt prefers spinal due to Pulmonary problems   COPD (chronic obstructive pulmonary disease) ([place])    oxygen  on standby in home.   Depression    Diverticulosis    Emphysema    Enlarged prostate with urinary retention    Esophageal stenosis    Gastric ulcer    GERD (gastroesophageal reflux disease)    Hiatal hernia    Hypothyroidism     Neuropathy    Osteoporosis    Pituitary macroadenoma ([facility])    Restless legs syndrome (RLS)    Tubular adenoma of colon    UGIB (upper gastrointestinal bleed) 03/2013   EGD w/ large ulcer at GE junction    Past Surgical History:  Procedure Laterality Date   ABDOMINAL HERNIA REPAIR   2008   BIOPSY  06/13/2020   Procedure: BIOPSY;  Surgeon: Montez Morita, Quillian Quince, MD;  [facility];  Service: Gastroenterology;;  gastric   CARDIAC CATHETERIZATION  01/2007   L main 30%, oLAD 50%,  pLAD stent ok, CFX 80%, OM 60%, pRCA 60%, mRCA 70%, oPDA 90%; med rx   cataract extraction both eyes     COLONOSCOPY     COLONOSCOPY WITH PROPOFOL N/A 06/13/2020   Procedure: COLONOSCOPY WITH PROPOFOL;  Surgeon: Harvel Quale, MD;  [facility];  Service: Gastroenterology;  Laterality: N/A;   CORONARY ANGIOPLASTY     CORONARY STENT PLACEMENT  03/2006   3.0 x 20 mm TAXUS Perseus DES to the LAD   CYSTOSCOPY N/A 06/10/2015   Procedure: CYSTOSCOPY FULGRATION OF BLEEDING,  electovapor resection;  Surgeon: Irine Seal, MD;  Location: [facility];  Service: Urology;  Laterality: N/A;   CYSTOSCOPY WITH INSERTION OF UROLIFT N/A 06/01/2015   Procedure: CYSTOSCOPY WITH INSERTION OF UROLIFT x4;  Surgeon: Irine Seal, MD;  [facility]: [facility];  Service: Urology;  Laterality: N/A;   ESOPHAGOGASTRODUODENOSCOPY N/A 04/20/2013   Procedure: ESOPHAGOGASTRODUODENOSCOPY (EGD);  Surgeon: Inda Castle, MD;  Location: [facility];  Service: Endoscopy;  Laterality: N/A;   ESOPHAGOGASTRODUODENOSCOPY N/A 03/25/2016   Procedure: ESOPHAGOGASTRODUODENOSCOPY (EGD);  Surgeon: Irene Shipper, MD;  Location: [facility];  Service: Endoscopy;  Laterality: N/A;   ESOPHAGOGASTRODUODENOSCOPY (EGD) WITH PROPOFOL N/A 10/13/2015   Procedure: ESOPHAGOGASTRODUODENOSCOPY (EGD) WITH PROPOFOL;  Surgeon: Jerene Bears, MD;  [facility];  Service: Gastroenterology;  Laterality: N/A;   ESOPHAGOGASTRODUODENOSCOPY (EGD) WITH  PROPOFOL N/A 06/13/2020   Procedure: ESOPHAGOGASTRODUODENOSCOPY (EGD) WITH PROPOFOL;  Surgeon: Harvel Quale, MD;  [facility];  Service: Gastroenterology;  Laterality: N/A;   FOOT SURGERY  1994 left, 2002 right foot   bilateral foot reconstruciton   FOOT SURGERY     reconstruction of both feet- no retained hardware.   HERNIA REPAIR     HIATAL HERNIA REPAIR  01-04-2008   JOINT REPLACEMENT     MELANOMA EXCISION  2019   POLYPECTOMY     POLYPECTOMY  06/13/2020   Procedure: POLYPECTOMY;  Surgeon: Harvel Quale, MD;  [facility];  Service: Gastroenterology;;  ascending,    PROSTATE SURGERY     x 2   SAVORY DILATION N/A 03/25/2016   Procedure: SAVORY DILATION;  Surgeon: Irene Shipper, MD;  [facility];  Service: Endoscopy;  Laterality: N/A;   TOTAL HIP ARTHROPLASTY  07/21/2011   Procedure: TOTAL HIP ARTHROPLASTY ANTERIOR APPROACH;  Surgeon: Mauri Pole, MD;  [facility];  Service: Orthopedics;  Laterality: Left;   TOTAL HIP ARTHROPLASTY Right 09/07/2012   Procedure: RIGHT TOTAL HIP ARTHROPLASTY ANTERIOR APPROACH;  Surgeon: Mcarthur Rossetti, MD;  [facility];  Service: Orthopedics;  Laterality: Right;   TRANSURETHRAL RESECTION OF BLADDER NECK N/A 11/04/2015   Procedure: RESECTION OF BLADDER NECK;  Surgeon: Cleon Gustin, MD;  [facility];  Service: Urology;  Laterality: N/A;   TRANSURETHRAL RESECTION OF PROSTATE N/A 11/04/2015   Procedure: TRANSURETHRAL RESECTION OF THE PROSTATE (TURP); REMOVAL OF UROLIFT IMPLANTS X THREE;  Surgeon: Cleon Gustin, MD;  [facility];  Service: Urology;  Laterality: N/A;   UPPER GASTROINTESTINAL ENDOSCOPY  12/2013   Dr Hilarie Fredrickson, gastritis   VIDEO BRONCHOSCOPY Bilateral 02/01/2017   Procedure: VIDEO BRONCHOSCOPY WITHOUT FLUORO;  Surgeon: Tanda Rockers, MD;  [facility];  Service: Cardiopulmonary;  Laterality: Bilateral;    Social History   Socioeconomic History    Marital status: Widowed    Spouse name: Not on file   Number of children: 2   Years of education: 14   Highest education level: Associate degree: occupational, Hotel manager, or vocational program  Occupational History   Occupation: retired  Tobacco Use   Smoking status: Never   Smokeless tobacco: Never  Vaping Use   Vaping Use: Never used  Substance and Sexual Activity   Alcohol use: No   Drug use: No   Sexual activity: Not Currently    Birth control/protection: None  Other Topics Concern   Not on file  Social History Narrative   Patient drinks caffeine a few times a week.   Patient is right handed.   Social Determinants of Health   Financial Resource Strain: Low Risk  Difficulty of Paying Living Expenses: Not hard at all  Food Insecurity: No Food Insecurity   Worried About [place] in the Last Year: Never true   Ran Out of Food in the Last Year: Never true  Transportation Needs: No Transportation Needs   Lack of Transportation (Medical): No   Lack of Transportation (Non-Medical): No  Physical Activity: Insufficiently Active   Days of Exercise per Week: 5 days   Minutes of Exercise per Session: 20 min  Stress: No Stress Concern Present   Feeling of Stress : Not at all  Social Connections: Moderately Integrated   Frequency of Communication with Friends and Family: More than three times a week   Frequency of Social Gatherings with Friends and Family: More than three times a week   Attends Religious Services: More than 4 times per year   Active Member of [employer] or Organizations: Yes   Attends Archivist Meetings: More than 4 times per year   Marital Status: Widowed  Human resources officer Violence: Not At Risk   Fear of Current or Ex-Partner: No   Emotionally Abused: No   Physically Abused: No   Sexually Abused: No    Current Outpatient Medications on File Prior to Visit  Medication Sig Dispense Refill   albuterol (VENTOLIN HFA) 108 (90 Base) MCG/ACT  inhaler Inhale 2 puffs into the lungs every 6 (six) hours as needed for wheezing or shortness of breath. 8 g 0   Ascorbic Acid (VITAMIN C) 500 MG CAPS Take 1 capsule by mouth daily.     aspirin EC 81 MG tablet Take 1 tablet (81 mg total) by mouth daily. Swallow whole. 90 tablet 3   bisacodyl (DULCOLAX) 10 MG suppository Place 10 mg rectally as needed for moderate constipation.     bisacodyl (FLEET) 10 MG/30ML ENEM Place 10 mg rectally daily as needed (severe constipation).     bromocriptine (PARLODEL) 2.5 MG tablet Take 0.5 tablets (1.25 mg total) by mouth 2 (two) times daily. 90 tablet 3   budesonide-formoterol (SYMBICORT) 160-4.5 MCG/ACT inhaler Inhale 2 puffs into the lungs in the morning and at bedtime. 10.2 g 6   cholecalciferol (VITAMIN D) 1000 UNITS tablet Take 1,000 Units by mouth daily.      ciprofloxacin (CIPRO) 500 MG tablet Take 500 mg by mouth 2 (two) times daily.     diclofenac Sodium (VOLTAREN) 1 % GEL Apply 2 g topically 4 (four) times daily. (Patient taking differently: Apply 2 g topically 4 (four) times daily as needed (pain).) 50 g 1   ferrous sulfate 325 (65 FE) MG tablet Take 1 tablet (325 mg total) by mouth daily with breakfast. (Patient taking differently: Take 325 mg by mouth every other day.) 90 tablet 3   GAVILAX 17 GM/SCOOP powder DISSOLVE 17 GM SCOOP IN 8 OZ. OF WATER AND DRINK ONCE DAILY AS NEEDED FOR CONSTIPATION (Patient taking differently: Take 17 g by mouth. Every 3-4 days) 510 g 0   guaiFENesin (MUCINEX) 600 MG 12 hr tablet Take 600 mg by mouth daily.     levothyroxine (SYNTHROID) 75 MCG tablet Take 1 tablet (75 mcg total) by mouth daily. (Patient taking differently: Take 75 mcg by mouth daily before breakfast.) 90 tablet 3   magnesium oxide (MAG-OX) 400 (241.3 Mg) MG tablet Take 1 tablet (400 mg total) by mouth 2 (two) times daily. (Patient taking differently: Take 400 mg by mouth as needed.) 15 tablet 0   naphazoline-pheniramine (NAPHCON-A) 0.025-0.3 % ophthalmic  solution Place  1 drop into the left eye 2 (two) times daily as needed for irritation or allergies.     nitroGLYCERIN (NITROSTAT) 0.4 MG SL tablet Place 1 tablet (0.4 mg total) under the tongue every 5 (five) minutes as needed for chest pain. 25 tablet 2   pantoprazole (PROTONIX) 40 MG tablet TAKE ONE TABLET TWICE DAILY 180 tablet 0   Respiratory Therapy Supplies (FLUTTER) DEVI Twice a day and prn as needed, may increase if feeling worse 1 each 0   sodium chloride HYPERTONIC 3 % nebulizer solution Take by nebulization as needed for other. (Patient taking differently: Take 4 mLs by nebulization daily.) 750 mL 12   sucralfate (CARAFATE) 1 g tablet Take 1 tablet three times daily as needed 270 tablet 3   tamsulosin (FLOMAX) 0.4 MG CAPS capsule Take 1 capsule (0.4 mg total) by mouth daily after supper. 90 capsule 3   triamcinolone cream (KENALOG) 0.1 % Apply 1 application topically 2 (two) times daily. (Patient taking differently: Apply 1 application topically 2 (two) times daily as needed (itching).) 30 g 0   vitamin B-12 (CYANOCOBALAMIN) 1000 MCG tablet Take 1,000 mcg by mouth daily.     vitamin E 180 MG (400 UNITS) capsule Take 400 Units by mouth daily.     No current facility-administered medications on file prior to visit.    Allergies  Allergen Reactions   Gadavist [Gadobutrol] Hives   Betadine [Povidone Iodine] Other (See Comments)    Blisters    Lortab [Hydrocodone-Acetaminophen] Nausea And Vomiting   Penicillins Other (See Comments)    Lightheadedness  Has patient had a PCN reaction causing immediate rash, facial/tongue/throat swelling, SOB or lightheadedness with hypotension: Yes Has patient had a PCN reaction causing severe rash involving mucus membranes or skin necrosis: No Has patient had a PCN reaction that required hospitalization No Has patient had a PCN reaction occurring within the last 10 years: No If all of the above answers are "NO", then may proceed with Cephalosporin use.    Zetia [Ezetimibe] Other (See Comments)    Muscle weakness    Zocor [Simvastatin] Nausea Only and Other (See Comments)    Muscle weakness     Family History  Problem Relation Age of Onset   Heart disease Sister    Heart failure Father    Other Mother        phebitis related to Mckenzie's birth, he was 64 weeks old when she died   Colon cancer Neg Hx    Esophageal cancer Neg Hx    Rectal cancer Neg Hx    Stomach cancer Neg Hx    Colon polyps Neg Hx     BP 112/69   Pulse 72   SpO2 94%    Review of Systems     Objective:   Physical Exam   Lab Results  Component Value Date   TSH 2.960 01/13/2021   T4TOTAL 6.7 09/24/2019      Assessment & Plan:  Pituitary adenoma Hyperprolactinemia.  Uncontrolled.  therapy limited by intolerance to parlodel.  We need to check MRI to make sure this does not represent enlarging adenoma  Patient Instructions  Let's recheck the MRI.  you will receive a phone call, about a day and time for an appointment.   Please continue the same bromocriptine and synthroid.   Please come back for a follow-up appointment in 6 months.

## 2021-02-08 ENCOUNTER — Ambulatory Visit (INDEPENDENT_AMBULATORY_CARE_PROVIDER_SITE_OTHER): Payer: Medicare Other

## 2021-02-08 VITALS — Ht 64.0 in | Wt 158.0 lb

## 2021-02-08 DIAGNOSIS — Z0000 Encounter for general adult medical examination without abnormal findings: Secondary | ICD-10-CM | POA: Diagnosis not present

## 2021-02-08 NOTE — Patient Instructions (Signed)
[name] , Thank you for taking time to come for your Medicare Wellness Visit. I appreciate your ongoing commitment to your health goals. Please review the following plan we discussed and let me know if I can assist you in the future.   Screening recommendations/referrals: Colonoscopy: Done 06/13/2020 - no repeat Recommended yearly ophthalmology/optometry visit for glaucoma screening and checkup Recommended yearly dental visit for hygiene and checkup  Vaccinations: Influenza vaccine: Done 01/13/2021 - Repeat annually  Pneumococcal vaccine: Done 10/29/2009 & 01/09/2013 Tdap vaccine: Done 01/15/2019 - Repeat in 10 years Shingles vaccine: Zostavax Done 2008 - due for Shingrix   Covid-19: Done 04/11/2019, 05/10/2019, & 03/31/2020  Advanced directives: Please bring a copy of your health care power of attorney and living will to the office to be added to your chart at your convenience.   Conditions/risks identified: Aim for 30 minutes of exercise or brisk walking each day, drink 6-8 glasses of water and eat lots of fruits and vegetables.   Next appointment: Follow up in one year for your annual wellness visit.   Preventive Care 38 Years and Older, Male  Preventive care refers to lifestyle choices and visits with your health care provider that can promote health and wellness. What does preventive care include? A yearly physical exam. This is also called an annual well check. Dental exams once or twice a year. Routine eye exams. Ask your health care provider how often you should have your eyes checked. Personal lifestyle choices, including: Daily care of your teeth and gums. Regular physical activity. Eating a healthy diet. Avoiding tobacco and drug use. Limiting alcohol use. Practicing safe sex. Taking low doses of aspirin every day. Taking vitamin and mineral supplements as recommended by your health care provider. What happens during an annual well check? The services and screenings  done by your health care provider during your annual well check will depend on your age, overall health, lifestyle risk factors, and family history of disease. Counseling  Your health care provider may ask you questions about your: Alcohol use. Tobacco use. Drug use. Emotional well-being. Home and relationship well-being. Sexual activity. Eating habits. History of falls. Memory and ability to understand (cognition). Work and work Statistician. Screening  You may have the following tests or measurements: Height, weight, and BMI. Blood pressure. Lipid and cholesterol levels. These may be checked every 5 years, or more frequently if you are over 45 years old. Skin check. Lung cancer screening. You may have this screening every year starting at age 71 if you have a 30-pack-year history of smoking and currently smoke or have quit within the past 15 years. Fecal occult blood test (FOBT) of the stool. You may have this test every year starting at age 41. Flexible sigmoidoscopy or colonoscopy. You may have a sigmoidoscopy every 5 years or a colonoscopy every 10 years starting at age 40. Prostate cancer screening. Recommendations will vary depending on your family history and other risks. Hepatitis C blood test. Hepatitis B blood test. Sexually transmitted disease (STD) testing. Diabetes screening. This is done by checking your blood sugar (glucose) after you have not eaten for a while (fasting). You may have this done every 1-3 years. Abdominal aortic aneurysm (AAA) screening. You may need this if you are a current or former smoker. Osteoporosis. You may be screened starting at age 58 if you are at high risk. Talk with your health care provider about your test results, treatment options, and if necessary, the need for more tests. Vaccines  Your health care provider may recommend certain vaccines, such as: Influenza vaccine. This is recommended every year. Tetanus, diphtheria, and acellular  pertussis (Tdap, Td) vaccine. You may need a Td booster every 10 years. Zoster vaccine. You may need this after age 7. Pneumococcal 13-valent conjugate (PCV13) vaccine. One dose is recommended after age 84. Pneumococcal polysaccharide (PPSV23) vaccine. One dose is recommended after age 65. Talk to your health care provider about which screenings and vaccines you need and how often you need them. This information is not intended to replace advice given to you by your health care provider. Make sure you discuss any questions you have with your health care provider. Document Released: 03/13/2015 Document Revised: 11/04/2015 Document Reviewed: 12/16/2014 Elsevier Interactive Patient Education  2017 [place] Prevention in the Home Falls can cause injuries. They can happen to people of all ages. There are many things you can do to make your home safe and to help prevent falls. What can I do on the outside of my home? Regularly fix the edges of walkways and driveways and fix any cracks. Remove anything that might make you trip as you walk through a door, such as a raised step or threshold. Trim any bushes or trees on the path to your home. Use bright outdoor lighting. Clear any walking paths of anything that might make someone trip, such as rocks or tools. Regularly check to see if handrails are loose or broken. Make sure that both sides of any steps have handrails. Any raised decks and porches should have guardrails on the edges. Have any leaves, snow, or ice cleared regularly. Use sand or salt on walking paths during winter. Clean up any spills in your garage right away. This includes oil or grease spills. What can I do in the bathroom? Use night lights. Install grab bars by the toilet and in the tub and shower. Do not use towel bars as grab bars. Use non-skid mats or decals in the tub or shower. If you need to sit down in the shower, use a plastic, non-slip stool. Keep the floor  dry. Clean up any water that spills on the floor as soon as it happens. Remove soap buildup in the tub or shower regularly. Attach bath mats securely with double-sided non-slip rug tape. Do not have throw rugs and other things on the floor that can make you trip. What can I do in the bedroom? Use night lights. Make sure that you have a light by your bed that is easy to reach. Do not use any sheets or blankets that are too big for your bed. They should not hang down onto the floor. Have a firm chair that has side arms. You can use this for support while you get dressed. Do not have throw rugs and other things on the floor that can make you trip. What can I do in the kitchen? Clean up any spills right away. Avoid walking on wet floors. Keep items that you use a lot in easy-to-reach places. If you need to reach something above you, use a strong step stool that has a grab bar. Keep electrical cords out of the way. Do not use floor polish or wax that makes floors slippery. If you must use wax, use non-skid floor wax. Do not have throw rugs and other things on the floor that can make you trip. What can I do with my stairs? Do not leave any items on the stairs. Make sure that there are  handrails on both sides of the stairs and use them. Fix handrails that are broken or loose. Make sure that handrails are as long as the stairways. Check any carpeting to make sure that it is firmly attached to the stairs. Fix any carpet that is loose or worn. Avoid having throw rugs at the top or bottom of the stairs. If you do have throw rugs, attach them to the floor with carpet tape. Make sure that you have a light switch at the top of the stairs and the bottom of the stairs. If you do not have them, ask someone to add them for you. What else can I do to help prevent falls? Wear shoes that: Do not have high heels. Have rubber bottoms. Are comfortable and fit you well. Are closed at the toe. Do not wear  sandals. If you use a stepladder: Make sure that it is fully opened. Do not climb a closed stepladder. Make sure that both sides of the stepladder are locked into place. Ask someone to hold it for you, if possible. Clearly mark and make sure that you can see: Any grab bars or handrails. First and last steps. Where the edge of each step is. Use tools that help you move around (mobility aids) if they are needed. These include: Canes. Walkers. Scooters. Crutches. Turn on the lights when you go into a dark area. Replace any light bulbs as soon as they burn out. Set up your furniture so you have a clear path. Avoid moving your furniture around. If any of your floors are uneven, fix them. If there are any pets around you, be aware of where they are. Review your medicines with your doctor. Some medicines can make you feel dizzy. This can increase your chance of falling. Ask your doctor what other things that you can do to help prevent falls. This information is not intended to replace advice given to you by your health care provider. Make sure you discuss any questions you have with your health care provider. Document Released: 12/11/2008 Document Revised: 07/23/2015 Document Reviewed: 03/21/2014 Elsevier Interactive Patient Education  2017 [employer].

## 2021-02-08 NOTE — Progress Notes (Signed)
Subjective:   [name] is a 87 y.o. male who presents for Medicare Annual/Subsequent preventive examination.  Virtual Visit via Telephone Note  I connected with  [name] on 02/08/21 at 11:15 AM EST by telephone and verified that I am speaking with the correct person using two identifiers.  Location: Patient: Home Provider: [facility] Persons participating in the virtual visit: patient/Nurse Health Advisor   I discussed the limitations, risks, security and privacy concerns of performing an evaluation and management service by telephone and the availability of in person appointments. The patient expressed understanding and agreed to proceed.  Interactive audio and video telecommunications were attempted between this nurse and patient, however failed, due to patient having technical difficulties OR patient did not have access to video capability.  We continued and completed visit with audio only.  Some vital signs may be absent or patient reported.   Allen Vantol E [name], LPN   Review of Systems     Cardiac Risk Factors include: advanced age (>79men, >28 women);male gender;dyslipidemia;Other (see comment), Risk factor comments: CAD, COPD, A.Fib     Objective:    Today's Vitals   02/08/21 1102  Weight: 158 lb (71.7 kg)  Height: 5\' 4"  (1.626 m)   Body mass index is 27.12 kg/m.  Advanced Directives 02/08/2021 06/11/2020 06/09/2020 06/08/2020 02/07/2020 12/28/2018 12/26/2017  Does Patient Have a Medical Advance Directive? Yes No - No Yes Yes Yes  Type of Paramedic of [place];Living will - - - [place];Living will [place];Living will [place];Living will  Does patient want to make changes to medical advance directive? - - - - No - Patient declined No - Patient declined No - Patient declined  Copy of [place] in Chart? No - copy requested - - - Yes - validated most recent copy  scanned in chart (See row information) No - copy requested Yes  Would patient like information on creating a medical advance directive? - No - Patient declined No - Patient declined - - - -  Pre-existing out of facility DNR order (yellow form or pink MOST form) - - - - - - -    Current Medications (verified) Outpatient Encounter Medications as of 02/08/2021  Medication Sig   albuterol (VENTOLIN HFA) 108 (90 Base) MCG/ACT inhaler Inhale 2 puffs into the lungs every 6 (six) hours as needed for wheezing or shortness of breath.   Ascorbic Acid (VITAMIN C) 500 MG CAPS Take 1 capsule by mouth daily.   aspirin EC 81 MG tablet Take 1 tablet (81 mg total) by mouth daily. Swallow whole.   bisacodyl (DULCOLAX) 10 MG suppository Place 10 mg rectally as needed for moderate constipation.   bisacodyl (FLEET) 10 MG/30ML ENEM Place 10 mg rectally daily as needed (severe constipation).   bromocriptine (PARLODEL) 2.5 MG tablet Take 0.5 tablets (1.25 mg total) by mouth 2 (two) times daily.   budesonide-formoterol (SYMBICORT) 160-4.5 MCG/ACT inhaler Inhale 2 puffs into the lungs in the morning and at bedtime.   cholecalciferol (VITAMIN D) 1000 UNITS tablet Take 1,000 Units by mouth daily.    ciprofloxacin (CIPRO) 500 MG tablet Take 500 mg by mouth 2 (two) times daily.   diclofenac Sodium (VOLTAREN) 1 % GEL Apply 2 g topically 4 (four) times daily. (Patient taking differently: Apply 2 g topically 4 (four) times daily as needed (pain).)   ferrous sulfate 325 (65 FE) MG tablet Take 1 tablet (325 mg total)  by mouth daily with breakfast. (Patient taking differently: Take 325 mg by mouth every other day.)   GAVILAX 17 GM/SCOOP powder DISSOLVE 17 GM SCOOP IN 8 OZ. OF WATER AND DRINK ONCE DAILY AS NEEDED FOR CONSTIPATION (Patient taking differently: Take 17 g by mouth. Every 3-4 days)   guaiFENesin (MUCINEX) 600 MG 12 hr tablet Take 600 mg by mouth daily.   levothyroxine (SYNTHROID) 75 MCG tablet Take 1 tablet (75 mcg  total) by mouth daily. (Patient taking differently: Take 75 mcg by mouth daily before breakfast.)   magnesium oxide (MAG-OX) 400 (241.3 Mg) MG tablet Take 1 tablet (400 mg total) by mouth 2 (two) times daily. (Patient taking differently: Take 400 mg by mouth as needed.)   naphazoline-pheniramine (NAPHCON-A) 0.025-0.3 % ophthalmic solution Place 1 drop into the left eye 2 (two) times daily as needed for irritation or allergies.   nitroGLYCERIN (NITROSTAT) 0.4 MG SL tablet Place 1 tablet (0.4 mg total) under the tongue every 5 (five) minutes as needed for chest pain.   pantoprazole (PROTONIX) 40 MG tablet TAKE ONE TABLET TWICE DAILY   Respiratory Therapy Supplies (FLUTTER) DEVI Twice a day and prn as needed, may increase if feeling worse   sodium chloride HYPERTONIC 3 % nebulizer solution Take by nebulization as needed for other. (Patient taking differently: Take 4 mLs by nebulization daily.)   sucralfate (CARAFATE) 1 g tablet Take 1 tablet three times daily as needed   tamsulosin (FLOMAX) 0.4 MG CAPS capsule Take 1 capsule (0.4 mg total) by mouth daily after supper.   triamcinolone cream (KENALOG) 0.1 % Apply 1 application topically 2 (two) times daily. (Patient taking differently: Apply 1 application topically 2 (two) times daily as needed (itching).)   vitamin B-12 (CYANOCOBALAMIN) 1000 MCG tablet Take 1,000 mcg by mouth daily.   vitamin E 180 MG (400 UNITS) capsule Take 400 Units by mouth daily.   No facility-administered encounter medications on file as of 02/08/2021.    Allergies (verified) Gadavist [gadobutrol], Betadine [povidone iodine], Lortab [hydrocodone-acetaminophen], Penicillins, Zetia [ezetimibe], and Zocor [simvastatin]   History: Past Medical History:  Diagnosis Date   Abnormality of gait 06/07/2013   Anemia    Aneurysm ([place])    on assending aorta, currently watching it, Dr Cyndia Bent   Anxiety    Arthritis    Asthma    Atrial fibrillation ([place])    B12 deficiency     Barrett's esophagus    CAD (coronary artery disease) 03/2006   3.0 x 20 mm TAXUS Perseus DES to the LAD; 01/2007  L main 30%, oLAD 50%, pLAD stent ok, CFX 80%, OM 60%, pRCA 60%, mRCA 70%, oPDA 90%; med rx    Cancer ([place])    skin cancer    Cataract    bilateral removal of cateracts   COLONIC POLYPS 07/17/2008   Qualifier: Diagnosis of  By: Lovette Cliche, CNA, Christy     Complication of anesthesia     no issues,but pt prefers spinal due to Pulmonary problems   COPD (chronic obstructive pulmonary disease) ([place])    oxygen  on standby in home.   Depression    Diverticulosis    Emphysema    Enlarged prostate with urinary retention    Esophageal stenosis    Gastric ulcer    GERD (gastroesophageal reflux disease)    Hiatal hernia    Hypothyroidism    Neuropathy    Osteoporosis    Pituitary macroadenoma (HCC)    Restless legs syndrome (RLS)    Tubular  adenoma of colon    UGIB (upper gastrointestinal bleed) 03/2013   EGD w/ large ulcer at GE junction   Past Surgical History:  Procedure Laterality Date   ABDOMINAL HERNIA REPAIR   2008   BIOPSY  06/13/2020   Procedure: BIOPSY;  Surgeon: Montez Morita, Quillian Quince, MD;  [facility];  Service: Gastroenterology;;  gastric   CARDIAC CATHETERIZATION  01/2007   L main 30%, oLAD 50%,  pLAD stent ok, CFX 80%, OM 60%, pRCA 60%, mRCA 70%, oPDA 90%; med rx   cataract extraction both eyes     COLONOSCOPY     COLONOSCOPY WITH PROPOFOL N/A 06/13/2020   Procedure: COLONOSCOPY WITH PROPOFOL;  Surgeon: Harvel Quale, MD;  [facility];  Service: Gastroenterology;  Laterality: N/A;   CORONARY ANGIOPLASTY     CORONARY STENT PLACEMENT  03/2006   3.0 x 20 mm TAXUS Perseus DES to the LAD   CYSTOSCOPY N/A 06/10/2015   Procedure: CYSTOSCOPY FULGRATION OF BLEEDING,  electovapor resection;  Surgeon: Irine Seal, MD;  [facility]: [facility];  Service: Urology;  Laterality: N/A;   CYSTOSCOPY WITH INSERTION OF UROLIFT N/A 06/01/2015   Procedure:  CYSTOSCOPY WITH INSERTION OF UROLIFT x4;  Surgeon: Irine Seal, MD;  [facility];  Service: Urology;  Laterality: N/A;   ESOPHAGOGASTRODUODENOSCOPY N/A 04/20/2013   Procedure: ESOPHAGOGASTRODUODENOSCOPY (EGD);  Surgeon: Inda Castle, MD;  [facility];  Service: Endoscopy;  Laterality: N/A;   ESOPHAGOGASTRODUODENOSCOPY N/A 03/25/2016   Procedure: ESOPHAGOGASTRODUODENOSCOPY (EGD);  Surgeon: Irene Shipper, MD;  [facility];  Service: Endoscopy;  Laterality: N/A;   ESOPHAGOGASTRODUODENOSCOPY (EGD) WITH PROPOFOL N/A 10/13/2015   Procedure: ESOPHAGOGASTRODUODENOSCOPY (EGD) WITH PROPOFOL;  Surgeon: Jerene Bears, MD;  [facility];  Service: Gastroenterology;  Laterality: N/A;   ESOPHAGOGASTRODUODENOSCOPY (EGD) WITH PROPOFOL N/A 06/13/2020   Procedure: ESOPHAGOGASTRODUODENOSCOPY (EGD) WITH PROPOFOL;  Surgeon: Harvel Quale, MD;  [facility];  Service: Gastroenterology;  Laterality: N/A;   FOOT SURGERY  1994 left, 2002 right foot   bilateral foot reconstruciton   FOOT SURGERY     reconstruction of both feet- no retained hardware.   HERNIA REPAIR     HIATAL HERNIA REPAIR  01-04-2008   JOINT REPLACEMENT     MELANOMA EXCISION  2019   POLYPECTOMY     POLYPECTOMY  06/13/2020   Procedure: POLYPECTOMY;  Surgeon: Harvel Quale, MD;  [facility];  Service: Gastroenterology;;  ascending,    PROSTATE SURGERY     x 2   SAVORY DILATION N/A 03/25/2016   Procedure: SAVORY DILATION;  Surgeon: Irene Shipper, MD;  [facility];  Service: Endoscopy;  Laterality: N/A;   TOTAL HIP ARTHROPLASTY  07/21/2011   Procedure: TOTAL HIP ARTHROPLASTY ANTERIOR APPROACH;  Surgeon: Mauri Pole, MD;  [facility];  Service: Orthopedics;  Laterality: Left;   TOTAL HIP ARTHROPLASTY Right 09/07/2012   Procedure: RIGHT TOTAL HIP ARTHROPLASTY ANTERIOR APPROACH;  Surgeon: Mcarthur Rossetti, MD;  [facility];  Service: Orthopedics;   Laterality: Right;   TRANSURETHRAL RESECTION OF BLADDER NECK N/A 11/04/2015   Procedure: RESECTION OF BLADDER NECK;  Surgeon: Cleon Gustin, MD;  [facility];  Service: Urology;  Laterality: N/A;   TRANSURETHRAL RESECTION OF PROSTATE N/A 11/04/2015   Procedure: TRANSURETHRAL RESECTION OF THE PROSTATE (TURP); REMOVAL OF UROLIFT IMPLANTS X THREE;  Surgeon: Cleon Gustin, MD;  [facility];  Service: Urology;  Laterality: N/A;   UPPER GASTROINTESTINAL  ENDOSCOPY  12/2013   Dr Hilarie Fredrickson, gastritis   VIDEO BRONCHOSCOPY Bilateral 02/01/2017   Procedure: VIDEO BRONCHOSCOPY WITHOUT FLUORO;  Surgeon: Tanda Rockers, MD;  [facility];  Service: Cardiopulmonary;  Laterality: Bilateral;   Family History  Problem Relation Age of Onset   Heart disease Sister    Heart failure Father    Other Mother        phebitis related to Jerold's birth, he was 59 weeks old when she died   Colon cancer Neg Hx    Esophageal cancer Neg Hx    Rectal cancer Neg Hx    Stomach cancer Neg Hx    Colon polyps Neg Hx    Social History   Socioeconomic History   Marital status: Widowed    Spouse name: Not on file   Number of children: 2   Years of education: 14   Highest education level: Associate degree: occupational, Hotel manager, or vocational program  Occupational History   Occupation: retired  Tobacco Use   Smoking status: Never   Smokeless tobacco: Never  Vaping Use   Vaping Use: Never used  Substance and Sexual Activity   Alcohol use: No   Drug use: No   Sexual activity: Not Currently    Birth control/protection: None  Other Topics Concern   Not on file  Social History Narrative   Patient drinks caffeine a few times a week.   Patient is right handed.   Social Determinants of Health   Financial Resource Strain: Low Risk    Difficulty of Paying Living Expenses: Not hard at all  Food Insecurity: No Food Insecurity   Worried About Charity fundraiser in the Last Year: Never true   [place] in the Last Year: Never true  Transportation Needs: No Transportation Needs   Lack of Transportation (Medical): No   Lack of Transportation (Non-Medical): No  Physical Activity: Insufficiently Active   Days of Exercise per Week: 7 days   Minutes of Exercise per Session: 20 min  Stress: No Stress Concern Present   Feeling of Stress : Not at all  Social Connections: Moderately Integrated   Frequency of Communication with Friends and Family: More than three times a week   Frequency of Social Gatherings with Friends and Family: More than three times a week   Attends Religious Services: More than 4 times per year   Active Member of [employer] or Organizations: Yes   Attends Archivist Meetings: More than 4 times per year   Marital Status: Widowed    Tobacco Counseling Counseling given: Not Answered   Clinical Intake:  Pre-visit preparation completed: Yes  Pain : No/denies pain     BMI - recorded: 27.12 Nutritional Status: BMI 25 -29 Overweight Nutritional Risks: None Diabetes: No  How often do you need to have someone help you when you read instructions, pamphlets, or other written materials from your doctor or pharmacy?: 1 - Never  Diabetic? no  Interpreter Needed?: No  Information entered by :: Olinda Nola, LPN   Activities of Daily Living In your present state of health, do you have any difficulty performing the following activities: 02/08/2021 06/12/2020  Hearing? Y -  Comment wears hearing aids -  Vision? N -  Difficulty concentrating or making decisions? N -  Walking or climbing stairs? Y -  Comment walks slow and holds rails -  Dressing or bathing? N -  Doing errands, shopping? N N  Preparing Food and eating ?  N -  Using the Toilet? N -  In the past six months, have you accidently leaked urine? N -  Do you have problems with loss of bowel control? N -  Managing your Medications? N -  Managing your Finances? N -  Housekeeping or managing  your Housekeeping? N -  Some recent data might be hidden    Patient Care Team: Dettinger, [name], MD as PCP - General (Family Medicine) Minus Breeding, MD as PCP - Cardiology (Cardiology) Renato Shin, MD as Consulting Physician (Endocrinology) Glenna Fellows, MD as Attending Physician (Neurosurgery) Tanda Rockers, MD as Consulting Physician (Pulmonary Disease) Gaye Pollack, MD as Consulting Physician (Cardiothoracic Surgery) McKenzie, Candee Furbish, MD as Consulting Physician (Urology)  Indicate any recent Medical Services you may have received from other than [facility] providers in the past year (date may be approximate).     Assessment:   This is a routine wellness examination for [name], [name].  Hearing/Vision screen Hearing Screening - Comments:: Moderate hearing difficulties - wears hearing aids from [facility] Vision Screening - Comments:: Wears rx glasses - up to date with annual eye exams with [name]  Dietary issues and exercise activities discussed: Current Exercise Habits: Home exercise routine, Type of exercise: walking;treadmill;stretching;Other - see comments (squats and push ups), Time (Minutes): 20, Frequency (Times/Week): 7, Weekly Exercise (Minutes/Week): 140, Intensity: Moderate, Exercise limited by: cardiac condition(s);neurologic condition(s);orthopedic condition(s)   Goals Addressed             This Visit's Progress    DIET - INCREASE WATER INTAKE   On track    Try to drink 6-8 glasses of water daily     Exercise 150 min/wk Moderate Activity   On track    Check with physical therapy to see what activities you can safely do.   02/07/2020 AWV Goal: Exercise for General Health  Patient will verbalize understanding of the benefits of increased physical activity: Exercising regularly is important. It will improve your overall fitness, flexibility, and endurance. Regular exercise also will improve your overall health. It can help you control your weight, reduce  stress, and improve your bone density. Over the next year, patient will increase physical activity as tolerated with a goal of at least 150 minutes of moderate physical activity per week.  You can tell that you are exercising at a moderate intensity if your heart starts beating faster and you start breathing faster but can still hold a conversation. Moderate-intensity exercise ideas include: Walking 1 mile (1.6 km) in about 15 minutes Biking Hiking Golfing Dancing Water aerobics Patient will verbalize understanding of everyday activities that increase physical activity by providing examples like the following: Yard work, such as: Sales promotion account executive Gardening Washing windows or floors Patient will be able to explain general safety guidelines for exercising:  Before you start a new exercise program, talk with your health care provider. Do not exercise so much that you hurt yourself, feel dizzy, or get very short of breath. Wear comfortable clothes and wear shoes with good support. Drink plenty of water while you exercise to prevent dehydration or heat stroke. Work out until your breathing and your heartbeat get faster.        Depression Screen PHQ 2/9 Scores 02/08/2021 01/13/2021 10/07/2020 07/16/2020 07/16/2020 06/23/2020 04/22/2020  PHQ - 2 Score 2 2 2 2  0 0 1  PHQ- 9 Score 4 6 - 4 - 0 -  Fall Risk Fall Risk  02/08/2021 01/13/2021 07/16/2020 07/16/2020 06/23/2020  Falls in the past year? 1 0 1 0 0  Number falls in past yr: 1 - 1 - -  Injury with Fall? 0 - 0 - -  Risk Factor Category  - - - - -  Risk for fall due to : Orthopedic patient;History of fall(s);Impaired balance/gait - Impaired balance/gait - -  Risk for fall due to: Comment - - - - -  Follow up Education provided;Falls prevention discussed - Falls evaluation completed - -    FALL RISK PREVENTION PERTAINING TO THE HOME:  Any stairs in or  around the home? Yes  If so, are there any without handrails? No  Home free of loose throw rugs in walkways, pet beds, electrical cords, etc? Yes  Adequate lighting in your home to reduce risk of falls? Yes   ASSISTIVE DEVICES UTILIZED TO PREVENT FALLS:  Life alert? Yes  Use of a cane, walker or w/c? Yes  Grab bars in the bathroom? Yes  Shower chair or bench in shower? Yes  Elevated toilet seat or a handicapped toilet? Yes   TIMED UP AND GO:  Was the test performed? No . Telephonic visit  Cognitive Function:     6CIT Screen 02/08/2021 02/07/2020 12/28/2018 12/26/2017  What Year? 0 points 0 points 0 points 0 points  What month? 0 points 0 points 0 points 0 points  What time? 0 points 0 points 0 points 0 points  Count back from 20 0 points 0 points 0 points 0 points  Months in reverse 0 points 0 points 4 points 0 points  Repeat phrase 0 points 0 points 0 points 0 points  Total Score 0 0 4 0    Immunizations Immunization History  Administered Date(s) Administered   Fluad Quad(high Dose 65+) 12/10/2018, 01/17/2020, 01/13/2021   Influenza Whole 11/22/2007, 10/29/2009, 10/30/2010, 11/29/2011   Influenza, High Dose Seasonal PF 12/03/2015, 12/26/2016, 12/27/2017   Influenza,inj,Quad PF,6+ Mos 12/03/2012, 11/25/2013, 12/19/2014   Moderna Sars-Covid-2 Vaccination 04/11/2019, 05/10/2019, 03/31/2020   Pneumococcal Conjugate-13 01/09/2013   Pneumococcal Polysaccharide-23 10/29/2009   Td 04/29/1998   Tdap 01/15/2019, 01/15/2019   Zoster, Live 03/31/2006    TDAP status: Up to date  Flu Vaccine status: Up to date  Pneumococcal vaccine status: Up to date  Covid-19 vaccine status: Completed vaccines  Qualifies for Shingles Vaccine? Yes   Zostavax completed Yes   Shingrix Completed?: No.    Education has been provided regarding the importance of this vaccine. Patient has been advised to call insurance company to determine out of pocket expense if they have not yet received this  vaccine. Advised may also receive vaccine at local pharmacy or Health Dept. Verbalized acceptance and understanding.  Screening Tests Health Maintenance  Topic Date Due   COVID-19 Vaccine (4 - Booster for Moderna series) 05/26/2020   Zoster Vaccines- Shingrix (1 of 2) 04/15/2021 (Originally 09/07/1982)   TETANUS/TDAP  01/14/2029   Pneumonia Vaccine 40+ Years old  Completed   INFLUENZA VACCINE  Completed   HPV VACCINES  Aged Out    Health Maintenance  Health Maintenance Due  Topic Date Due   COVID-19 Vaccine (4 - Booster for Moderna series) 05/26/2020    Colorectal cancer screening: No longer required.   Lung Cancer Screening: (Low Dose CT Chest recommended if Age 86-80 years, 30 pack-year currently smoking OR have quit w/in 15years.) does not qualify.   Additional Screening:  Hepatitis C Screening: does not qualify  Vision  Screening: Recommended annual ophthalmology exams for early detection of glaucoma and other disorders of the eye. Is the patient up to date with their annual eye exam?  Yes  Who is the provider or what is the name of the office in which the patient attends annual eye exams? [facility] If pt is not established with a provider, would they like to be referred to a provider to establish care? No .   Dental Screening: Recommended annual dental exams for proper oral hygiene  Community Resource Referral / Chronic Care Management: CRR required this visit?  No   CCM required this visit?  No      Plan:     I have personally reviewed and noted the following in the patient's chart:   Medical and social history Use of alcohol, tobacco or illicit drugs  Current medications and supplements including opioid prescriptions. Patient is not currently taking opioid prescriptions. Functional ability and status Nutritional status Physical activity Advanced directives List of other physicians Hospitalizations, surgeries, and ER visits in previous 12  months Vitals Screenings to include cognitive, depression, and falls Referrals and appointments  In addition, I have reviewed and discussed with patient certain preventive protocols, quality metrics, and best practice recommendations. A written personalized care plan for preventive services as well as general preventive health recommendations were provided to patient.     Sandrea Hammond, LPN   [id number]   Nurse Notes: None

## 2021-02-10 ENCOUNTER — Other Ambulatory Visit: Payer: Self-pay | Admitting: Family Medicine

## 2021-02-25 ENCOUNTER — Encounter: Payer: Self-pay | Admitting: Family Medicine

## 2021-02-25 NOTE — Telephone Encounter (Signed)
Cough is worse at night. Pts cough is productive. Has wheezing at times. Taking Mucinex twice daily.   Appt scheduled with Raked 12/30 at 3:05

## 2021-02-25 NOTE — Telephone Encounter (Signed)
Pt called in-asked to talk to nurse. Please call back

## 2021-02-26 ENCOUNTER — Ambulatory Visit (INDEPENDENT_AMBULATORY_CARE_PROVIDER_SITE_OTHER): Payer: Medicare Other

## 2021-02-26 ENCOUNTER — Encounter: Payer: Self-pay | Admitting: Family Medicine

## 2021-02-26 ENCOUNTER — Ambulatory Visit (INDEPENDENT_AMBULATORY_CARE_PROVIDER_SITE_OTHER): Payer: Medicare Other | Admitting: Family Medicine

## 2021-02-26 VITALS — BP 138/82 | HR 80 | Temp 97.9°F | Ht 63.75 in | Wt 168.0 lb

## 2021-02-26 DIAGNOSIS — R0602 Shortness of breath: Secondary | ICD-10-CM | POA: Diagnosis not present

## 2021-02-26 DIAGNOSIS — R053 Chronic cough: Secondary | ICD-10-CM

## 2021-02-26 DIAGNOSIS — J069 Acute upper respiratory infection, unspecified: Secondary | ICD-10-CM | POA: Diagnosis not present

## 2021-02-26 MED ORDER — AZITHROMYCIN 250 MG PO TABS: ORAL_TABLET | ORAL | 0 refills | Status: DC

## 2021-02-26 NOTE — Progress Notes (Signed)
Subjective:  Patient ID: [name], male    DOB: Jun 24, 1932, 87 y.o.   MRN: [id number]  Patient Care Team: Dettinger, [name], MD as PCP - General (Family Medicine) Minus Breeding, MD as PCP - Cardiology (Cardiology) Renato Shin, MD as Consulting Physician (Endocrinology) Glenna Fellows, MD as Attending Physician (Neurosurgery) Tanda Rockers, MD as Consulting Physician (Pulmonary Disease) Gaye Pollack, MD as Consulting Physician (Cardiothoracic Surgery) McKenzie, Candee Furbish, MD as Consulting Physician (Urology)   Chief Complaint:  Cough (Productive cough, chronic)   HPI: [name] is a 87 y.o. male presenting on 02/26/2021 for Cough (Productive cough, chronic)   Patient presents today with complaints of ongoing cough and chest congestion for several weeks.  Reports cough is productive.  No fever, chills, weakness, or shortness of breath.  Patient has been taking Mucinex and Tylenol at home with some relief of symptoms.  No known sick contacts.  Patient does have a history of COPD.    Relevant past medical, surgical, family, and social history reviewed and updated as indicated.  Allergies and medications reviewed and updated. Data reviewed: Chart in Epic.   Past Medical History:  Diagnosis Date   Abnormality of gait 06/07/2013   Anemia    Aneurysm ([place])    on assending aorta, currently watching it, Dr Cyndia Bent   Anxiety    Arthritis    Asthma    Atrial fibrillation ([place])    B12 deficiency    Barrett's esophagus    CAD (coronary artery disease) 03/2006   3.0 x 20 mm TAXUS Perseus DES to the LAD; 01/2007  L main 30%, oLAD 50%, pLAD stent ok, CFX 80%, OM 60%, pRCA 60%, mRCA 70%, oPDA 90%; med rx    Cancer ([place])    skin cancer    Cataract    bilateral removal of cateracts   COLONIC POLYPS 07/17/2008   Qualifier: Diagnosis of  By: Lovette Cliche, CNA, Christy     Complication of anesthesia     no issues,but pt prefers spinal due to Pulmonary problems   COPD (chronic  obstructive pulmonary disease) ([place])    oxygen  on standby in home.   Depression    Diverticulosis    Emphysema    Enlarged prostate with urinary retention    Esophageal stenosis    Gastric ulcer    GERD (gastroesophageal reflux disease)    Hiatal hernia    Hypothyroidism    Neuropathy    Osteoporosis    Pituitary macroadenoma ([facility])    Restless legs syndrome (RLS)    Tubular adenoma of colon    UGIB (upper gastrointestinal bleed) 03/2013   EGD w/ large ulcer at GE junction    Past Surgical History:  Procedure Laterality Date   ABDOMINAL HERNIA REPAIR   2008   BIOPSY  06/13/2020   Procedure: BIOPSY;  Surgeon: Montez Morita, Quillian Quince, MD;  [facility];  Service: Gastroenterology;;  gastric   CARDIAC CATHETERIZATION  01/2007   L main 30%, oLAD 50%,  pLAD stent ok, CFX 80%, OM 60%, pRCA 60%, mRCA 70%, oPDA 90%; med rx   cataract extraction both eyes     COLONOSCOPY     COLONOSCOPY WITH PROPOFOL N/A 06/13/2020   Procedure: COLONOSCOPY WITH PROPOFOL;  Surgeon: Harvel Quale, MD;  [facility];  Service: Gastroenterology;  Laterality: N/A;   CORONARY ANGIOPLASTY     CORONARY STENT PLACEMENT  03/2006   3.0 x 20 mm TAXUS Perseus  DES to the LAD   CYSTOSCOPY N/A 06/10/2015   Procedure: CYSTOSCOPY FULGRATION OF BLEEDING,  electovapor resection;  Surgeon: Irine Seal, MD;  [facility]: [facility];  Service: Urology;  Laterality: N/A;   CYSTOSCOPY WITH INSERTION OF UROLIFT N/A 06/01/2015   Procedure: CYSTOSCOPY WITH INSERTION OF UROLIFT x4;  Surgeon: Irine Seal, MD;  [facility];  Service: Urology;  Laterality: N/A;   ESOPHAGOGASTRODUODENOSCOPY N/A 04/20/2013   Procedure: ESOPHAGOGASTRODUODENOSCOPY (EGD);  Surgeon: Inda Castle, MD;  [facility];  Service: Endoscopy;  Laterality: N/A;   ESOPHAGOGASTRODUODENOSCOPY N/A 03/25/2016   Procedure: ESOPHAGOGASTRODUODENOSCOPY (EGD);  Surgeon: Irene Shipper, MD;  Location: [facility];  Service: Endoscopy;   Laterality: N/A;   ESOPHAGOGASTRODUODENOSCOPY (EGD) WITH PROPOFOL N/A 10/13/2015   Procedure: ESOPHAGOGASTRODUODENOSCOPY (EGD) WITH PROPOFOL;  Surgeon: Jerene Bears, MD;  [facility];  Service: Gastroenterology;  Laterality: N/A;   ESOPHAGOGASTRODUODENOSCOPY (EGD) WITH PROPOFOL N/A 06/13/2020   Procedure: ESOPHAGOGASTRODUODENOSCOPY (EGD) WITH PROPOFOL;  Surgeon: Harvel Quale, MD;  [facility];  Service: Gastroenterology;  Laterality: N/A;   FOOT SURGERY  1994 left, 2002 right foot   bilateral foot reconstruciton   FOOT SURGERY     reconstruction of both feet- no retained hardware.   HERNIA REPAIR     HIATAL HERNIA REPAIR  01-04-2008   JOINT REPLACEMENT     MELANOMA EXCISION  2019   POLYPECTOMY     POLYPECTOMY  06/13/2020   Procedure: POLYPECTOMY;  Surgeon: Harvel Quale, MD;  [facility];  Service: Gastroenterology;;  ascending,    PROSTATE SURGERY     x 2   SAVORY DILATION N/A 03/25/2016   Procedure: SAVORY DILATION;  Surgeon: Irene Shipper, MD;  [facility];  Service: Endoscopy;  Laterality: N/A;   TOTAL HIP ARTHROPLASTY  07/21/2011   Procedure: TOTAL HIP ARTHROPLASTY ANTERIOR APPROACH;  Surgeon: Mauri Pole, MD;  [facility];  Service: Orthopedics;  Laterality: Left;   TOTAL HIP ARTHROPLASTY Right 09/07/2012   Procedure: RIGHT TOTAL HIP ARTHROPLASTY ANTERIOR APPROACH;  Surgeon: Mcarthur Rossetti, MD;  [facility];  Service: Orthopedics;  Laterality: Right;   TRANSURETHRAL RESECTION OF BLADDER NECK N/A 11/04/2015   Procedure: RESECTION OF BLADDER NECK;  Surgeon: Cleon Gustin, MD;  [facility];  Service: Urology;  Laterality: N/A;   TRANSURETHRAL RESECTION OF PROSTATE N/A 11/04/2015   Procedure: TRANSURETHRAL RESECTION OF THE PROSTATE (TURP); REMOVAL OF UROLIFT IMPLANTS X THREE;  Surgeon: Cleon Gustin, MD;  [facility];  Service: Urology;  Laterality: N/A;   UPPER GASTROINTESTINAL ENDOSCOPY   12/2013   Dr Hilarie Fredrickson, gastritis   VIDEO BRONCHOSCOPY Bilateral 02/01/2017   Procedure: VIDEO BRONCHOSCOPY WITHOUT FLUORO;  Surgeon: Tanda Rockers, MD;  [facility];  Service: Cardiopulmonary;  Laterality: Bilateral;    Social History   Socioeconomic History   Marital status: Widowed    Spouse name: Not on file   Number of children: 2   Years of education: 14   Highest education level: Associate degree: occupational, Hotel manager, or vocational program  Occupational History   Occupation: retired  Tobacco Use   Smoking status: Never   Smokeless tobacco: Never  Vaping Use   Vaping Use: Never used  Substance and Sexual Activity   Alcohol use: No   Drug use: No   Sexual activity: Not Currently    Birth control/protection: None  Other Topics Concern   Not on file  Social History Narrative   Patient  drinks caffeine a few times a week.   Patient is right handed.   Social Determinants of Health   Financial Resource Strain: Low Risk    Difficulty of Paying Living Expenses: Not hard at all  Food Insecurity: No Food Insecurity   Worried About Charity fundraiser in the Last Year: Never true   [place] in the Last Year: Never true  Transportation Needs: No Transportation Needs   Lack of Transportation (Medical): No   Lack of Transportation (Non-Medical): No  Physical Activity: Insufficiently Active   Days of Exercise per Week: 7 days   Minutes of Exercise per Session: 20 min  Stress: No Stress Concern Present   Feeling of Stress : Not at all  Social Connections: Moderately Integrated   Frequency of Communication with Friends and Family: More than three times a week   Frequency of Social Gatherings with Friends and Family: More than three times a week   Attends Religious Services: More than 4 times per year   Active Member of [employer] or Organizations: Yes   Attends Archivist Meetings: More than 4 times per year   Marital Status: Widowed  Intimate  Partner Violence: Unknown   Fear of Current or Ex-Partner: No   Emotionally Abused: No   Physically Abused: No   Sexually Abused: Not on file    Outpatient Encounter Medications as of 02/26/2021  Medication Sig   azithromycin (ZITHROMAX Z-PAK) 250 MG tablet As directed   FEROSUL 325 (65 Fe) MG tablet TAKE (1) TABLET DAILY WITH BREAKFAST.   albuterol (VENTOLIN HFA) 108 (90 Base) MCG/ACT inhaler Inhale 2 puffs into the lungs every 6 (six) hours as needed for wheezing or shortness of breath.   Ascorbic Acid (VITAMIN C) 500 MG CAPS Take 1 capsule by mouth daily.   aspirin EC 81 MG tablet Take 1 tablet (81 mg total) by mouth daily. Swallow whole.   bisacodyl (DULCOLAX) 10 MG suppository Place 10 mg rectally as needed for moderate constipation.   bisacodyl (FLEET) 10 MG/30ML ENEM Place 10 mg rectally daily as needed (severe constipation).   bromocriptine (PARLODEL) 2.5 MG tablet Take 0.5 tablets (1.25 mg total) by mouth 2 (two) times daily.   budesonide-formoterol (SYMBICORT) 160-4.5 MCG/ACT inhaler Inhale 2 puffs into the lungs in the morning and at bedtime.   cholecalciferol (VITAMIN D) 1000 UNITS tablet Take 1,000 Units by mouth daily.    ciprofloxacin (CIPRO) 500 MG tablet Take 500 mg by mouth 2 (two) times daily.   diclofenac Sodium (VOLTAREN) 1 % GEL Apply 2 g topically 4 (four) times daily. (Patient taking differently: Apply 2 g topically 4 (four) times daily as needed (pain).)   GAVILAX 17 GM/SCOOP powder DISSOLVE 17 GM SCOOP IN 8 OZ. OF WATER AND DRINK ONCE DAILY AS NEEDED FOR CONSTIPATION (Patient taking differently: Take 17 g by mouth. Every 3-4 days)   guaiFENesin (MUCINEX) 600 MG 12 hr tablet Take 600 mg by mouth daily.   levothyroxine (SYNTHROID) 75 MCG tablet Take 1 tablet (75 mcg total) by mouth daily. (Patient taking differently: Take 75 mcg by mouth daily before breakfast.)   magnesium oxide (MAG-OX) 400 (241.3 Mg) MG tablet Take 1 tablet (400 mg total) by mouth 2 (two) times  daily. (Patient taking differently: Take 400 mg by mouth as needed.)   naphazoline-pheniramine (NAPHCON-A) 0.025-0.3 % ophthalmic solution Place 1 drop into the left eye 2 (two) times daily as needed for irritation or allergies.   nitroGLYCERIN (NITROSTAT) 0.4  MG SL tablet Place 1 tablet (0.4 mg total) under the tongue every 5 (five) minutes as needed for chest pain.   pantoprazole (PROTONIX) 40 MG tablet TAKE ONE TABLET TWICE DAILY   Respiratory Therapy Supplies (FLUTTER) DEVI Twice a day and prn as needed, may increase if feeling worse   sodium chloride HYPERTONIC 3 % nebulizer solution Take by nebulization as needed for other. (Patient taking differently: Take 4 mLs by nebulization daily.)   sucralfate (CARAFATE) 1 g tablet Take 1 tablet three times daily as needed   tamsulosin (FLOMAX) 0.4 MG CAPS capsule Take 1 capsule (0.4 mg total) by mouth daily after supper.   triamcinolone cream (KENALOG) 0.1 % Apply 1 application topically 2 (two) times daily. (Patient taking differently: Apply 1 application topically 2 (two) times daily as needed (itching).)   vitamin B-12 (CYANOCOBALAMIN) 1000 MCG tablet Take 1,000 mcg by mouth daily.   vitamin E 180 MG (400 UNITS) capsule Take 400 Units by mouth daily.   No facility-administered encounter medications on file as of 02/26/2021.    Allergies  Allergen Reactions   Gadavist [Gadobutrol] Hives   Betadine [Povidone Iodine] Other (See Comments)    Blisters    Lortab [Hydrocodone-Acetaminophen] Nausea And Vomiting   Penicillins Other (See Comments)    Lightheadedness  Has patient had a PCN reaction causing immediate rash, facial/tongue/throat swelling, SOB or lightheadedness with hypotension: Yes Has patient had a PCN reaction causing severe rash involving mucus membranes or skin necrosis: No Has patient had a PCN reaction that required hospitalization No Has patient had a PCN reaction occurring within the last 10 years: No If all of the above answers  are "NO", then may proceed with Cephalosporin use.   Zetia [Ezetimibe] Other (See Comments)    Muscle weakness    Zocor [Simvastatin] Nausea Only and Other (See Comments)    Muscle weakness     Review of Systems  Constitutional:  Negative for activity change, appetite change, chills, diaphoresis, fatigue, fever and unexpected weight change.  HENT:  Positive for congestion. Negative for dental problem, drooling, ear discharge, ear pain, facial swelling, hearing loss, mouth sores, nosebleeds, postnasal drip, rhinorrhea, sinus pressure, sinus pain, sneezing, sore throat, tinnitus, trouble swallowing and voice change.   Eyes: Negative.   Respiratory:  Positive for cough. Negative for chest tightness and shortness of breath.   Cardiovascular:  Negative for chest pain, palpitations and leg swelling.  Gastrointestinal:  Negative for abdominal pain, blood in stool, constipation, diarrhea, nausea and vomiting.  Endocrine: Negative.   Genitourinary:  Negative for decreased urine volume, dysuria, frequency and urgency.  Musculoskeletal:  Negative for arthralgias and myalgias.  Skin: Negative.   Allergic/Immunologic: Negative.   Neurological:  Negative for dizziness, weakness and headaches.  Hematological: Negative.   Psychiatric/Behavioral:  Negative for confusion, hallucinations, sleep disturbance and suicidal ideas.   All other systems reviewed and are negative.      Objective:  BP 138/82    Pulse 80    Temp 97.9 F (36.6 C)    Ht 5' 3.75" (1.619 m)    Wt 168 lb (76.2 kg)    SpO2 97%    BMI 29.06 kg/m    Wt Readings from Last 3 Encounters:  02/26/21 168 lb (76.2 kg)  02/08/21 158 lb (71.7 kg)  01/13/21 164 lb (74.4 kg)    Physical Exam Vitals and nursing note reviewed.  Constitutional:      General: He is not in acute distress.    Appearance:  Normal appearance. He is well-developed, well-groomed and overweight. He is not ill-appearing, toxic-appearing or diaphoretic.  HENT:      Head: Normocephalic and atraumatic.     Jaw: There is normal jaw occlusion.     Right Ear: Decreased hearing noted.     Left Ear: Decreased hearing noted.     Ears:     Comments: Bilateral hearing aids    Nose: Congestion present. No rhinorrhea.     Mouth/Throat:     Lips: Pink.     Mouth: Mucous membranes are moist.     Pharynx: Oropharynx is clear. Uvula midline. No oropharyngeal exudate or posterior oropharyngeal erythema.  Eyes:     General: Lids are normal.     Extraocular Movements: Extraocular movements intact.     Conjunctiva/sclera: Conjunctivae normal.     Pupils: Pupils are equal, round, and reactive to light.  Neck:     Thyroid: No thyroid mass, thyromegaly or thyroid tenderness.     Vascular: No carotid bruit or JVD.     Trachea: Trachea and phonation normal.  Cardiovascular:     Rate and Rhythm: Normal rate and regular rhythm.     Chest Wall: PMI is not displaced.     Pulses: Normal pulses.     Heart sounds: Normal heart sounds. No murmur heard.   No friction rub. No gallop.  Pulmonary:     Effort: Pulmonary effort is normal. No respiratory distress.     Breath sounds: Normal breath sounds. No stridor. No wheezing, rhonchi or rales.  Chest:     Chest wall: No tenderness.  Abdominal:     General: Bowel sounds are normal. There is no distension or abdominal bruit.     Palpations: Abdomen is soft. There is no hepatomegaly or splenomegaly.     Tenderness: There is no abdominal tenderness. There is no right CVA tenderness or left CVA tenderness.     Hernia: No hernia is present.  Musculoskeletal:        General: Normal range of motion.     Cervical back: Normal range of motion and neck supple.     Right lower leg: No edema.     Left lower leg: No edema.  Lymphadenopathy:     Cervical: No cervical adenopathy.  Skin:    General: Skin is warm and dry.     Capillary Refill: Capillary refill takes less than 2 seconds.     Coloration: Skin is not cyanotic, jaundiced  or pale.     Findings: No rash.  Neurological:     General: No focal deficit present.     Mental Status: He is alert and oriented to person, place, and time.     Sensory: Sensation is intact.     Motor: Motor function is intact.     Coordination: Coordination is intact.     Gait: Gait abnormal (using cane).     Deep Tendon Reflexes: Reflexes are normal and symmetric.  Psychiatric:        Attention and Perception: Attention and perception normal.        Mood and Affect: Mood and affect normal.        Speech: Speech normal.        Behavior: Behavior normal. Behavior is cooperative.        Thought Content: Thought content normal.        Cognition and Memory: Cognition and memory normal.        Judgment: Judgment normal.    Results  for orders placed or performed in visit on 01/13/21  CMP14+EGFR  Result Value Ref Range   Glucose 84 70 - 99 mg/dL   BUN 19 8 - 27 mg/dL   Creatinine, Ser 1.37 0.76 - 1.27 mg/dL   eGFR 75 >12 WQ/YXY/0.20   BUN/Creatinine Ratio 19 10 - 24   Sodium 141 134 - 144 mmol/L   Potassium 4.5 3.5 - 5.2 mmol/L   Chloride 102 96 - 106 mmol/L   CO2 21 20 - 29 mmol/L   Calcium 9.0 8.6 - 10.2 mg/dL   Total Protein 6.5 6.0 - 8.5 g/dL   Albumin 4.1 3.6 - 4.6 g/dL   Globulin, Total 2.4 1.5 - 4.5 g/dL   Albumin/Globulin Ratio 1.7 1.2 - 2.2   Bilirubin Total 0.3 0.0 - 1.2 mg/dL   Alkaline Phosphatase 79 44 - 121 IU/L   AST 20 0 - 40 IU/L   ALT 11 0 - 44 IU/L  Lipid panel  Result Value Ref Range   Cholesterol, Total 178 100 - 199 mg/dL   Triglycerides 69 0 - 149 mg/dL   HDL 55 >06 mg/dL   VLDL Cholesterol Cal 13 5 - 40 mg/dL   LDL Chol Calc (NIH) 162 (H) 0 - 99 mg/dL   Chol/HDL Ratio 3.2 0.0 - 5.0 ratio  TSH  Result Value Ref Range   TSH 2.960 0.450 - 4.500 uIU/mL  CBC with Differential/Platelet  Result Value Ref Range   WBC 9.5 3.4 - 10.8 x10E3/uL   RBC 4.12 (L) 4.14 - 5.80 x10E6/uL   Hemoglobin 13.0 13.0 - 17.7 g/dL   Hematocrit 81.7 95.8 - 51.0 %   MCV  96 79 - 97 fL   MCH 31.6 26.6 - 33.0 pg   MCHC 33.0 31.5 - 35.7 g/dL   RDW 84.3 65.6 - 17.1 %   Platelets 245 150 - 450 x10E3/uL   Neutrophils 64 Not Estab. %   Lymphs 13 Not Estab. %   Monocytes 13 Not Estab. %   Eos 3 Not Estab. %   Basos 2 Not Estab. %   Neutrophils Absolute 6.2 1.4 - 7.0 x10E3/uL   Lymphocytes Absolute 1.2 0.7 - 3.1 x10E3/uL   Monocytes Absolute 1.2 (H) 0.1 - 0.9 x10E3/uL   EOS (ABSOLUTE) 0.3 0.0 - 0.4 x10E3/uL   Basophils Absolute 0.2 0.0 - 0.2 x10E3/uL   Immature Granulocytes 5 Not Estab. %   Immature Grans (Abs) 0.5 (H) 0.0 - 0.1 x10E3/uL  Prolactin  Result Value Ref Range   Prolactin 196.0 (H) 4.0 - 15.2 ng/mL   *Note: Due to a large number of results and/or encounters for the requested time period, some results have not been displayed. A complete set of results can be found in Results Review.     X-Ray: CXR: No acute findings. Preliminary x-ray reading by Kari Baars, FNP-C, [facility].   Pertinent labs & imaging results that were available during my care of the patient were reviewed by me and considered in my medical decision making.  Assessment & Plan:  Joy was seen today for cough.  Diagnoses and all orders for this visit:  Chronic cough URI cough and congestion Chest x-ray without acute findings.  Due to ongoing symptoms for over 2 weeks, will initiate azithromycin as prescribed.  Patient aware to continue Mucinex and Tylenol for symptomatic relief.  Report any new, worsening, or persistent symptoms.  Follow-up with pulmonology as scheduled. -     DG Chest 2 View; Future -  azithromycin (ZITHROMAX Z-PAK) 250 MG tablet; As directed    Continue all other maintenance medications.  Follow up plan: Return in about 2 weeks (around 03/12/2021), or if symptoms worsen or fail to improve, for PCP.   Continue healthy lifestyle choices, including diet (rich in fruits, vegetables, and lean proteins, and low in salt and simple carbohydrates) and exercise  (at least 30 minutes of moderate physical activity daily).  Educational handout given for URI [place]  The above assessment and management plan was discussed with the patient. The patient verbalized understanding of and has agreed to the management plan. Patient is aware to call the clinic if they develop any new symptoms or if symptoms persist or worsen. Patient is aware when to return to the clinic for a follow-up visit. Patient educated on when it is appropriate to go to the emergency department.   Monia Pouch, FNP-C [facility] [phone]

## 2021-03-02 ENCOUNTER — Other Ambulatory Visit: Payer: Self-pay

## 2021-03-02 ENCOUNTER — Ambulatory Visit
Admission: RE | Admit: 2021-03-02 | Discharge: 2021-03-02 | Disposition: A | Discharge status: Home / Self Care | Payer: Medicare Other | Source: Ambulatory Visit | Attending: Endocrinology | Admitting: Endocrinology

## 2021-03-02 ENCOUNTER — Other Ambulatory Visit: Payer: Self-pay | Admitting: Endocrinology

## 2021-03-02 DIAGNOSIS — D352 Benign neoplasm of pituitary gland: Secondary | ICD-10-CM

## 2021-03-02 DIAGNOSIS — D353 Benign neoplasm of craniopharyngeal duct: Secondary | ICD-10-CM

## 2021-03-03 ENCOUNTER — Telehealth: Payer: Self-pay | Admitting: Family Medicine

## 2021-03-03 NOTE — Telephone Encounter (Signed)
Coughing and SOB  No fever  Mucous is not better  Advised to trying tylenol/ IBU, increase water intake  He will continue Mucinex BID  He will call back in a couple of days if no better

## 2021-03-03 NOTE — Telephone Encounter (Signed)
Pt finished taking azithromycin (ZITHROMAX Z-PAK) 250 MG tablet and would like to know if he should take another round of the medication or if there is something else that he should try. He is feeling better but still not 100%. Please call back and advise.

## 2021-03-05 ENCOUNTER — Other Ambulatory Visit: Payer: Self-pay

## 2021-03-05 ENCOUNTER — Ambulatory Visit: Payer: Medicare Other | Admitting: Pulmonary Disease

## 2021-03-05 ENCOUNTER — Encounter: Payer: Self-pay | Admitting: Pulmonary Disease

## 2021-03-05 VITALS — BP 122/62 | HR 73 | Temp 97.5°F | Ht 65.0 in | Wt 163.0 lb

## 2021-03-05 DIAGNOSIS — J471 Bronchiectasis with (acute) exacerbation: Secondary | ICD-10-CM | POA: Diagnosis not present

## 2021-03-05 MED ORDER — BUDESONIDE-FORMOTEROL FUMARATE 160-4.5 MCG/ACT IN AERO: 2.0000 | INHALATION_SPRAY | Freq: Two times a day (BID) | RESPIRATORY_TRACT | 6 refills | Status: AC

## 2021-03-05 MED ORDER — LEVOFLOXACIN 750 MG PO TABS: 750.0000 mg | ORAL_TABLET | Freq: Every day | ORAL | 0 refills | Status: DC

## 2021-03-05 MED ORDER — PREDNISONE 20 MG PO TABS: 40.0000 mg | ORAL_TABLET | Freq: Every day | ORAL | 0 refills | Status: AC

## 2021-03-05 NOTE — Progress Notes (Signed)
Subjective:   PATIENT ID: [name] GENDER: male DOB: 11/08/1932, MRN: [id number]   HPI  Chief Complaint  Patient presents with   Follow-up    COPD Gold I   Reason for Visit: Follow-up   [name] is a 88 year old male never smoker with bronchiectasis with obstructive lung defect who presents for follow-up.  Synopsis: Previously seen by Dr. Melvyn [name] initially in 2012 and seen again in 2018   05/2020 On our last visit, he was treated with levaquin for bronchiectasis exacerbation. He did well after completing antibiotics. However he developed by hemoptysis and was admitted to the hospital. EGD demonstrated esophagitis. He is currently on doxycycline which causes him some nausea. He has had no further episodes of hemoptysis. He has been compliant with his flutter valve. He reports he is feeling more short of breath. Chronic sputum production in am that is unchanged.  03/05/21 Since our last visit he was treated for exacerbation in October 2022 with NP [name]. Improved symptoms however he reports recent worsening shortness of breath with productive cough with thick white sputum. Occurs throughout the day but worse when laying down. He will having coughing spells when eating. Cold air worsens it. Has wheezing at night. Has been using hypertonic saline once a day. Reports fever 101 a few days ago.   Social History: Never smoker  Past Medical History:  Diagnosis Date   Abnormality of gait 06/07/2013   Anemia    Aneurysm ([facility])    on assending aorta, currently watching it, Dr [name]   Anxiety    Arthritis    Asthma    Atrial fibrillation ([place])    B12 deficiency    Barrett's esophagus    CAD (coronary artery disease) 03/2006   3.0 x 20 mm TAXUS [place] DES to the LAD; 01/2007  L main 30%, oLAD 50%, pLAD stent ok, CFX 80%, OM 60%, pRCA 60%, mRCA 70%, oPDA 90%; med rx    Cancer ([place])    skin cancer    Cataract    bilateral removal of cateracts   COLONIC POLYPS 07/17/2008    Qualifier: Diagnosis of  By: Lovette Cliche, CNA, Christy     Complication of anesthesia     no issues,but pt prefers spinal due to Pulmonary problems   COPD (chronic obstructive pulmonary disease) ([place])    oxygen  on standby in home.   Depression    Diverticulosis    Emphysema    Enlarged prostate with urinary retention    Esophageal stenosis    Gastric ulcer    GERD (gastroesophageal reflux disease)    Hiatal hernia    Hypothyroidism    Neuropathy    Osteoporosis    Pituitary macroadenoma (H[facility])    Restless legs syndrome (RLS)    Tubular adenoma of colon    UGIB (upper gastrointestinal bleed) 03/2013   EGD w/ large ulcer at GE junction     Family History  Problem Relation Age of Onset   Heart disease Sister    Heart failure Father    Other Mother        phebitis related to [name]'s birth, he was 60 weeks old when she died   Colon cancer Neg Hx    Esophageal cancer Neg Hx    Rectal cancer Neg Hx    Stomach cancer Neg Hx    Colon polyps Neg Hx      Social History   Occupational History   Occupation: retired  Tobacco  Use   Smoking status: Never   Smokeless tobacco: Never  Vaping Use   Vaping Use: Never used  Substance and Sexual Activity   Alcohol use: No   Drug use: No   Sexual activity: Not Currently    Birth control/protection: None    Allergies  Allergen Reactions   Gadavist [Gadobutrol] Hives   Betadine [Povidone Iodine] Other (See Comments)    Blisters    Lortab [Hydrocodone-Acetaminophen] Nausea And Vomiting   Penicillins Other (See Comments)    Lightheadedness  Has patient had a PCN reaction causing immediate rash, facial/tongue/throat swelling, SOB or lightheadedness with hypotension: Yes Has patient had a PCN reaction causing severe rash involving mucus membranes or skin necrosis: No Has patient had a PCN reaction that required hospitalization No Has patient had a PCN reaction occurring within the last 10 years: No If all of the above answers are "NO", then  may proceed with Cephalosporin use.   Zetia [Ezetimibe] Other (See Comments)    Muscle weakness    Zocor [Simvastatin] Nausea Only and Other (See Comments)    Muscle weakness      Outpatient Medications Prior to Visit  Medication Sig Dispense Refill   FEROSUL 325 (65 Fe) MG tablet TAKE (1) TABLET DAILY WITH BREAKFAST. 90 tablet 3   albuterol (VENTOLIN HFA) 108 (90 Base) MCG/ACT inhaler Inhale 2 puffs into the lungs every 6 (six) hours as needed for wheezing or shortness of breath. 8 g 0   Ascorbic Acid (VITAMIN C) 500 MG CAPS Take 1 capsule by mouth daily.     aspirin EC 81 MG tablet Take 1 tablet (81 mg total) by mouth daily. Swallow whole. 90 tablet 3   azithromycin (ZITHROMAX Z-PAK) 250 MG tablet As directed 6 tablet 0   bisacodyl (DULCOLAX) 10 MG suppository Place 10 mg rectally as needed for moderate constipation.     bisacodyl (FLEET) 10 MG/30ML ENEM Place 10 mg rectally daily as needed (severe constipation).     bromocriptine (PARLODEL) 2.5 MG tablet Take 0.5 tablets (1.25 mg total) by mouth 2 (two) times daily. 90 tablet 3   budesonide-formoterol (SYMBICORT) 160-4.5 MCG/ACT inhaler Inhale 2 puffs into the lungs in the morning and at bedtime. 10.2 g 6   cholecalciferol (VITAMIN D) 1000 UNITS tablet Take 1,000 Units by mouth daily.      ciprofloxacin (CIPRO) 500 MG tablet Take 500 mg by mouth 2 (two) times daily.     diclofenac Sodium (VOLTAREN) 1 % GEL Apply 2 g topically 4 (four) times daily. (Patient taking differently: Apply 2 g topically 4 (four) times daily as needed (pain).) 50 g 1   GAVILAX 17 GM/SCOOP powder DISSOLVE 17 GM SCOOP IN 8 OZ. OF WATER AND DRINK ONCE DAILY AS NEEDED FOR CONSTIPATION (Patient taking differently: Take 17 g by mouth. Every 3-4 days) 510 g 0   guaiFENesin (MUCINEX) 600 MG 12 hr tablet Take 600 mg by mouth daily.     levothyroxine (SYNTHROID) 75 MCG tablet Take 1 tablet (75 mcg total) by mouth daily. (Patient taking differently: Take 75 mcg by mouth daily  before breakfast.) 90 tablet 3   magnesium oxide (MAG-OX) 400 (241.3 Mg) MG tablet Take 1 tablet (400 mg total) by mouth 2 (two) times daily. (Patient taking differently: Take 400 mg by mouth as needed.) 15 tablet 0   naphazoline-pheniramine (NAPHCON-A) 0.025-0.3 % ophthalmic solution Place 1 drop into the left eye 2 (two) times daily as needed for irritation or allergies.  nitroGLYCERIN (NITROSTAT) 0.4 MG SL tablet Place 1 tablet (0.4 mg total) under the tongue every 5 (five) minutes as needed for chest pain. 25 tablet 2   pantoprazole (PROTONIX) 40 MG tablet TAKE ONE TABLET TWICE DAILY 180 tablet 0   Respiratory Therapy Supplies (FLUTTER) DEVI Twice a day and prn as needed, may increase if feeling worse 1 each 0   sodium chloride HYPERTONIC 3 % nebulizer solution Take by nebulization as needed for other. (Patient taking differently: Take 4 mLs by nebulization daily.) 750 mL 12   sucralfate (CARAFATE) 1 g tablet Take 1 tablet three times daily as needed 270 tablet 3   tamsulosin (FLOMAX) 0.4 MG CAPS capsule Take 1 capsule (0.4 mg total) by mouth daily after supper. 90 capsule 3   triamcinolone cream (KENALOG) 0.1 % Apply 1 application topically 2 (two) times daily. (Patient taking differently: Apply 1 application topically 2 (two) times daily as needed (itching).) 30 g 0   vitamin B-12 (CYANOCOBALAMIN) 1000 MCG tablet Take 1,000 mcg by mouth daily.     vitamin E 180 MG (400 UNITS) capsule Take 400 Units by mouth daily.     No facility-administered medications prior to visit.    Review of Systems  Constitutional:  Positive for fever. Negative for chills, diaphoresis, malaise/fatigue and weight loss.  HENT:  Negative for congestion.   Respiratory:  Positive for cough, sputum production, shortness of breath and wheezing. Negative for hemoptysis.   Cardiovascular:  Negative for chest pain, palpitations and leg swelling.    Objective:   Vitals:   03/05/21 1129  BP: 122/62  Pulse: 73   Temp: (!) 97.5 F (36.4 C)  TempSrc: Oral  SpO2: 97%  Weight: 163 lb (73.9 kg)  Height: 5\' 5"  (1.651 m)     Physical Exam: General: Well-appearing, no acute distress HENT: [place], AT Eyes: EOMI, no scleral icterus Respiratory: Scattered rhonchi with occasional wheeze Cardiovascular: RRR, -M/R/G, no JVD Extremities:-Edema,-tenderness Neuro: AAO x4, CNII-XII grossly intact Psych: Normal mood, normal affect  Data Reviewed:  Imaging: CT Chest 06/08/20 - unchanged chronic debris and mucoid impaction. Unchanged bibasilar scarring. Unchanged ascending thoracic aortic aneurysm CTA 06/09/20 - no pulmonary embolism. Similar changes as noted in CT Chest 06/08/20 CXR 02/26/21 No acute infiltrate, left basilar atelectasis  PFT: 08/06/17 FVC 3.81 (111%) FEV1 2.44 (102%) Ratio 63  TLC 93%  Interpretation: Reversible mild obstructive lung defect. Significant bronchodilator response present  Labs: CBC    Component Value Date/Time   WBC 9.5 01/13/2021 1223   WBC 9.8 11/03/2020 1045   RBC 4.12 (L) 01/13/2021 1223   RBC 4.29 11/03/2020 1045   HGB 13.0 01/13/2021 1223   HCT 39.4 01/13/2021 1223   PLT 245 01/13/2021 1223   MCV 96 01/13/2021 1223   MCH 31.6 01/13/2021 1223   MCH 32.0 06/13/2020 0513   MCHC 33.0 01/13/2021 1223   MCHC 32.6 11/03/2020 1045   RDW 14.5 01/13/2021 1223   LYMPHSABS 1.2 01/13/2021 1223   MONOABS 1.1 (H) 07/14/2020 1102   EOSABS 0.3 01/13/2021 1223   BASOSABS 0.2 01/13/2021 1223   Absolute eos 01/13/21 - 300     Assessment & Plan:   Discussion: 88 year old male never smoker with bronchiectasis, atrial fib not on anticoagulation (hx GIB secondary esophagitis 05/2020), thoracic aortic aneurysm and GERD  Bronchiectasis with obstructive defect Bronchiectasis Exacerbation --START levaquin 750 mg for 7 days --START prednisone 40 mg for 5 days --REFILL Symbicort. Take TWO puffs TWICE a day --Hypertonic saline nebulizer  TWICE a day as needed  GERD, hiatal  hernia --Counseled on GERD diet --Handout printed for recommended foods to avoid/minimize  Health Maintenance Immunization History  Administered Date(s) Administered   Fluad Quad(high Dose 65+) 12/10/2018, 01/17/2020, 01/13/2021   Influenza Whole 11/22/2007, 10/29/2009, 10/30/2010, 11/29/2011   Influenza, High Dose Seasonal PF 12/03/2015, 12/26/2016, 12/27/2017   Influenza,inj,Quad PF,6+ Mos 12/03/2012, 11/25/2013, 12/19/2014   Moderna Sars-Covid-2 Vaccination 04/11/2019, 05/10/2019, 03/31/2020   Pneumococcal Conjugate-13 01/09/2013   Pneumococcal Polysaccharide-23 10/29/2009   Td 04/29/1998   Tdap 01/15/2019, 01/15/2019   Zoster, Live 03/31/2006   CT Lung Screen - not indicated in non-smoker  No orders of the defined types were placed in this encounter.  Meds ordered this encounter  Medications   predniSONE (DELTASONE) 20 MG tablet    Sig: Take 2 tablets (40 mg total) by mouth daily with breakfast for 5 days.    Dispense:  10 tablet    Refill:  0   budesonide-formoterol (SYMBICORT) 160-4.5 MCG/ACT inhaler    Sig: Inhale 2 puffs into the lungs in the morning and at bedtime.    Dispense:  10.2 g    Refill:  6   levofloxacin (LEVAQUIN) 750 MG tablet    Sig: Take 1 tablet (750 mg total) by mouth daily.    Dispense:  7 tablet    Refill:  0    Return in about 5 months (around 08/03/2021).  I have spent a total time of 36-minutes on the day of the appointment reviewing prior documentation, coordinating care and discussing medical diagnosis and plan with the patient/family. Imaging, labs and tests included in this note have been reviewed and interpreted independently by me.  [place], MD [facility] 03/05/2021 11:21 AM  Office Number [phone]

## 2021-03-05 NOTE — Patient Instructions (Addendum)
Bronchiectasis Exacerbation --START levaquin 750 mg for 7 days --START prednisone 40 mg for 5 days --REFILL Symbicort. Take TWO puffs TWICE a day --Hypertonic saline nebulizer TWICE a day as needed

## 2021-03-09 ENCOUNTER — Telehealth: Payer: Self-pay | Admitting: Pulmonary Disease

## 2021-03-09 ENCOUNTER — Encounter: Payer: Self-pay | Admitting: Pulmonary Disease

## 2021-03-09 NOTE — Telephone Encounter (Signed)
Call made to patient, confirmed DOB. Patient states the prednisone is making his anxiety high and he cannot sleep. He states his congestion is nearly gone and he feels much better. 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 He reports he has two more days left and as long as he is feeling better he would like to not take the last two days worth of doses due to the side effects of the prednisone.   AO please advise. Thanks :)

## 2021-03-09 NOTE — Telephone Encounter (Signed)
Okay to stop the prednisone

## 2021-03-09 NOTE — Telephone Encounter (Signed)
Called and spoke with patient. Dr. Judson Roch recommendations given.  Understanding stated. Nothing further at this time.

## 2021-03-10 NOTE — Telephone Encounter (Signed)
Received the following message from patient:   "Relating to my OV Jan. 6th. I began taken two meds --Levofloxacin and Prednisone. The prednisone is now keeping me awake at night. Also unpleasant imaginations. I have two more days to continue with this med. I would like to stop. My coughing is clear and not congestion. [name] dob [birth year]  -- Ph [phone] -- Thanks,"

## 2021-03-17 ENCOUNTER — Encounter: Payer: Self-pay | Admitting: Family Medicine

## 2021-03-17 ENCOUNTER — Ambulatory Visit (INDEPENDENT_AMBULATORY_CARE_PROVIDER_SITE_OTHER): Payer: Medicare Other | Admitting: Family Medicine

## 2021-03-17 ENCOUNTER — Ambulatory Visit (INDEPENDENT_AMBULATORY_CARE_PROVIDER_SITE_OTHER): Payer: Medicare Other

## 2021-03-17 VITALS — BP 119/73 | HR 81 | Temp 98.0°F | Ht 65.0 in | Wt 162.0 lb

## 2021-03-17 DIAGNOSIS — K5909 Other constipation: Secondary | ICD-10-CM

## 2021-03-17 DIAGNOSIS — J449 Chronic obstructive pulmonary disease, unspecified: Secondary | ICD-10-CM | POA: Diagnosis not present

## 2021-03-17 MED ORDER — BREZTRI AEROSPHERE 160-9-4.8 MCG/ACT IN AERO: 2.0000 | INHALATION_SPRAY | Freq: Two times a day (BID) | RESPIRATORY_TRACT | 11 refills | Status: DC

## 2021-03-17 NOTE — Progress Notes (Signed)
BP 119/73    Pulse 81    Temp 98 F (36.7 C) (Temporal)    Ht 5\' 5"  (1.651 m)    Wt 162 lb (73.5 kg)    SpO2 94%    BMI 26.96 kg/m    Subjective:   Patient ID: [name], male    DOB: [birth year], 88 y.o.   MRN: [id number]  HPI: [name] is a 88 y.o. male presenting on 03/17/2021 for Hernia   HPI Follow-up for chest congestion and COPD Patient has been fighting bronchitis and COPD and multiple rounds recently.  He most recently did Levaquin and prednisone.  He has been taking his Symbicort and he still feels like he is get short of breath going up and down stairs.  Think that he should fluid to triple therapy.  Patient still has wheezing and coughing although his cough is much less productive than it was.  Patient has right lower abdominal hernia that he says that he was told he had a hernia in the past but was not a surgical candidate and just wanted to comment on it.  He denies any significant pains from it and it is always been reducible even when it pops out.  Patient does fight constipation chronically and uses MiraLAX occasionally and it does help has been sufficient but just wanted to comment on it today.  Relevant past medical, surgical, family and social history reviewed and updated as indicated. Interim medical history since our last visit reviewed. Allergies and medications reviewed and updated.  Review of Systems  Constitutional:  Negative for chills and fever.  HENT:  Positive for congestion.   Respiratory:  Positive for cough, shortness of breath and wheezing.   Cardiovascular:  Negative for chest pain and leg swelling.  Musculoskeletal:  Negative for back pain and gait problem.  Skin:  Negative for rash.  All other systems reviewed and are negative.  Per HPI unless specifically indicated above   Allergies as of 03/17/2021       Reactions   Gadavist [gadobutrol] Hives   Prednisone    Agitation and hallucinations   Betadine [povidone Iodine] Other  (See Comments)   Blisters    Lortab [hydrocodone-acetaminophen] Nausea And Vomiting   Penicillins Other (See Comments)   Lightheadedness  Has patient had a PCN reaction causing immediate rash, facial/tongue/throat swelling, SOB or lightheadedness with hypotension: Yes Has patient had a PCN reaction causing severe rash involving mucus membranes or skin necrosis: No Has patient had a PCN reaction that required hospitalization No Has patient had a PCN reaction occurring within the last 10 years: No If all of the above answers are "NO", then may proceed with Cephalosporin use.   Zetia [ezetimibe] Other (See Comments)   Muscle weakness    Zocor [simvastatin] Nausea Only, Other (See Comments)   Muscle weakness         Medication List        Accurate as of March 17, 2021  4:49 PM. If you have any questions, ask your nurse or doctor.          albuterol 108 (90 Base) MCG/ACT inhaler Commonly known as: VENTOLIN HFA Inhale 2 puffs into the lungs every 6 (six) hours as needed for wheezing or shortness of breath.   aspirin EC 81 MG tablet Take 1 tablet (81 mg total) by mouth daily. Swallow whole.   bisacodyl 10 MG suppository Commonly known as: DULCOLAX Place 10 mg rectally as needed for moderate constipation.  bisacodyl 10 MG/30ML Enem Commonly known as: FLEET Place 10 mg rectally daily as needed (severe constipation).   Breztri Aerosphere 160-9-4.8 MCG/ACT Aero Generic drug: Budeson-Glycopyrrol-Formoterol Inhale 2 puffs into the lungs 2 (two) times daily. Started by: Worthy Rancher, MD   bromocriptine 2.5 MG tablet Commonly known as: PARLODEL Take 0.5 tablets (1.25 mg total) by mouth 2 (two) times daily.   budesonide-formoterol 160-4.5 MCG/ACT inhaler Commonly known as: Symbicort Inhale 2 puffs into the lungs in the morning and at bedtime.   cholecalciferol 1000 units tablet Commonly known as: VITAMIN D Take 1,000 Units by mouth daily.   diclofenac Sodium 1 %  Gel Commonly known as: VOLTAREN Apply 2 g topically 4 (four) times daily. What changed:  when to take this reasons to take this   diphenhydramine-acetaminophen 25-500 MG Tabs tablet Commonly known as: TYLENOL PM Take 1 tablet by mouth at bedtime as needed.   FeroSul 325 (65 FE) MG tablet Generic drug: ferrous sulfate TAKE (1) TABLET DAILY WITH BREAKFAST.   Flutter Devi Twice a day and prn as needed, may increase if feeling worse   GaviLAX 17 GM/SCOOP powder Generic drug: polyethylene glycol powder DISSOLVE 17 GM SCOOP IN 8 OZ. OF WATER AND DRINK ONCE DAILY AS NEEDED FOR CONSTIPATION What changed: See the new instructions.   guaiFENesin 600 MG 12 hr tablet Commonly known as: MUCINEX Take 600 mg by mouth daily.   levofloxacin 750 MG tablet Commonly known as: LEVAQUIN Take 1 tablet (750 mg total) by mouth daily.   levothyroxine 75 MCG tablet Commonly known as: SYNTHROID Take 1 tablet (75 mcg total) by mouth daily. What changed: when to take this   magnesium oxide 400 (241.3 Mg) MG tablet Commonly known as: MAG-OX Take 1 tablet (400 mg total) by mouth 2 (two) times daily. What changed:  when to take this reasons to take this   naphazoline-pheniramine 0.025-0.3 % ophthalmic solution Commonly known as: NAPHCON-A Place 1 drop into the left eye 2 (two) times daily as needed for irritation or allergies.   nitroGLYCERIN 0.4 MG SL tablet Commonly known as: NITROSTAT Place 1 tablet (0.4 mg total) under the tongue every 5 (five) minutes as needed for chest pain.   pantoprazole 40 MG tablet Commonly known as: PROTONIX TAKE ONE TABLET TWICE DAILY   sodium chloride HYPERTONIC 3 % nebulizer solution Take by nebulization as needed for other. What changed:  how much to take when to take this   sucralfate 1 g tablet Commonly known as: CARAFATE Take 1 tablet three times daily as needed   tamsulosin 0.4 MG Caps capsule Commonly known as: FLOMAX Take 1 capsule (0.4 mg  total) by mouth daily after supper.   triamcinolone cream 0.1 % Commonly known as: KENALOG Apply 1 application topically 2 (two) times daily. What changed:  when to take this reasons to take this   vitamin B-12 1000 MCG tablet Commonly known as: CYANOCOBALAMIN Take 1,000 mcg by mouth daily.   Vitamin C 500 MG Caps Take 1 capsule by mouth daily.   vitamin E 180 MG (400 UNITS) capsule Take 400 Units by mouth daily.         Objective:   BP 119/73    Pulse 81    Temp 98 F (36.7 C) (Temporal)    Ht 5\' 5"  (1.651 m)    Wt 162 lb (73.5 kg)    SpO2 94%    BMI 26.96 kg/m   Wt Readings from Last 3 Encounters:  03/17/21  162 lb (73.5 kg)  03/05/21 163 lb (73.9 kg)  02/26/21 168 lb (76.2 kg)    Physical Exam Vitals and nursing note reviewed.  Constitutional:      General: He is not in acute distress.    Appearance: He is well-developed. He is not diaphoretic.  Eyes:     General: No scleral icterus.    Conjunctiva/sclera: Conjunctivae normal.  Neck:     Thyroid: No thyromegaly.  Cardiovascular:     Rate and Rhythm: Normal rate and regular rhythm.     Heart sounds: Normal heart sounds. No murmur heard. Pulmonary:     Effort: Pulmonary effort is normal. No respiratory distress.     Breath sounds: Rhonchi present. No wheezing or rales.  Chest:     Chest wall: No tenderness.  Abdominal:     General: Abdomen is flat. Bowel sounds are normal. There is no distension.     Palpations: Abdomen is soft.     Tenderness: There is no abdominal tenderness.     Comments: No palpable hernia  Musculoskeletal:        General: Normal range of motion.     Cervical back: Neck supple.  Lymphadenopathy:     Cervical: No cervical adenopathy.  Skin:    General: Skin is warm and dry.     Findings: No rash.  Neurological:     Mental Status: He is alert and oriented to person, place, and time.     Coordination: Coordination normal.  Psychiatric:        Behavior: Behavior normal.    Chest  x-ray: Pending await final read from radiologist  Assessment & Plan:   Problem List Items Addressed This Visit       Respiratory   COPD (chronic obstructive pulmonary disease) ([place]) - Primary   Relevant Medications   Budeson-Glycopyrrol-Formoterol (BREZTRI AEROSPHERE) 160-9-4.8 MCG/ACT AERO   Other Relevant Orders   DG Chest 2 View     Digestive   Chronic constipation    Increased medicine to breztri to see if they can help more with his COPD symptoms.   Patient continues to use MiraLAX for chronic constipation.  No palpable hernia on exam. Follow up plan: Return if symptoms worsen or fail to improve.  Counseling provided for all of the vaccine components Orders Placed This Encounter  Procedures   DG Chest [place], MD [facility] 03/17/2021, 4:49 PM

## 2021-03-30 ENCOUNTER — Encounter: Payer: Self-pay | Admitting: Family Medicine

## 2021-03-31 ENCOUNTER — Ambulatory Visit (INDEPENDENT_AMBULATORY_CARE_PROVIDER_SITE_OTHER): Payer: Medicare Other | Admitting: Family Medicine

## 2021-03-31 ENCOUNTER — Encounter: Payer: Self-pay | Admitting: Family Medicine

## 2021-03-31 DIAGNOSIS — J449 Chronic obstructive pulmonary disease, unspecified: Secondary | ICD-10-CM | POA: Diagnosis not present

## 2021-03-31 NOTE — Progress Notes (Signed)
Virtual Visit via Telephone Note  I connected with [name] on 03/31/21 at 11:16 AM by telephone and verified that I am speaking with the correct person using two identifiers. [name] is currently located at home and nobody is currently with him during this visit. The provider, [name], FNP is located in their office at time of visit.  I discussed the limitations, risks, security and privacy concerns of performing an evaluation and management service by telephone and the availability of in person appointments. I also discussed with the patient that there may be a patient responsible charge related to this service. The patient expressed understanding and agreed to proceed.  Subjective: PCP: Dettinger, [name], MD  Chief Complaint  Patient presents with   Cough   Patient complains of cough, chest congestion, shortness of breath, and wheezing. Cough is productive of thick, white/tanish colored sputum and is mostly at night when he is lying down. Shortness of breath and wheezing is not any different than usual. Onset of symptoms was a few weeks ago, slightly worsening since that time. He is drinking moderate amounts of fluids. Evaluation to date: none. Treatment to date: none. He has a history of COPD. He does not smoke. He does not understand why he always has this congestion and coughs stuff up. He has [employer]ibr[employer], academic but only uses it once daily.    ROS: Per HPI  Current Outpatient Medications:    albuterol (VENTOLIN HFA) 108 (90 Base) MCG/ACT inhaler, Inhale 2 puffs into the lungs every 6 (six) hours as needed for wheezing or shortness of breath., Disp: 8 g, Rfl: 0   Ascorbic Acid (VITAMIN C) 500 MG CAPS, Take 1 capsule by mouth daily., Disp: , Rfl:    aspirin EC 81 MG tablet, Take 1 tablet (81 mg total) by mouth daily. Swallow whole., Disp: 90 tablet, Rfl: 3   bisacodyl (DULCOLAX) 10 MG suppository, Place 10 mg rectally as needed for moderate constipation., Disp: , Rfl:     bisacodyl (FLEET) 10 MG/30ML ENEM, Place 10 mg rectally daily as needed (severe constipation)., Disp: , Rfl:    bromocriptine (PARLODEL) 2.5 MG tablet, Take 0.5 tablets (1.25 mg total) by mouth 2 (two) times daily., Disp: 90 tablet, Rfl: 3   Budeson-Glycopyrrol-Formoterol (BREZTRI AEROSPHERE) 160-9-4.8 MCG/ACT AERO, Inhale 2 puffs into the lungs 2 (two) times daily., Disp: 10.7 g, Rfl: 11   budesonide-formoterol (SYMBICORT) 160-4.5 MCG/ACT inhaler, Inhale 2 puffs into the lungs in the morning and at bedtime., Disp: 10.2 g, Rfl: 6   cholecalciferol (VITAMIN D) 1000 UNITS tablet, Take 1,000 Units by mouth daily. , Disp: , Rfl:    diclofenac Sodium (VOLTAREN) 1 % GEL, Apply 2 g topically 4 (four) times daily. (Patient taking differently: Apply 2 g topically 4 (four) times daily as needed (pain).), Disp: 50 g, Rfl: 1   diphenhydramine-acetaminophen (TYLENOL PM) 25-500 MG TABS tablet, Take 1 tablet by mouth at bedtime as needed., Disp: , Rfl:    FEROSUL 325 (65 Fe) MG tablet, TAKE (1) TABLET DAILY WITH BREAKFAST., Disp: 90 tablet, Rfl: 3   GAVILAX 17 GM/SCOOP powder, DISSOLVE 17 GM SCOOP IN 8 OZ. OF WATER AND DRINK ONCE DAILY AS NEEDED FOR CONSTIPATION (Patient taking differently: Take 17 g by mouth. Every 3-4 days), Disp: 510 g, Rfl: 0   guaiFENesin (MUCINEX) 600 MG 12 hr tablet, Take 600 mg by mouth daily., Disp: , Rfl:    levofloxacin (LEVAQUIN) 750 MG tablet, Take 1 tablet (750 mg  total) by mouth daily., Disp: 7 tablet, Rfl: 0   levothyroxine (SYNTHROID) 75 MCG tablet, Take 1 tablet (75 mcg total) by mouth daily. (Patient taking differently: Take 75 mcg by mouth daily before breakfast.), Disp: 90 tablet, Rfl: 3   magnesium oxide (MAG-OX) 400 (241.3 Mg) MG tablet, Take 1 tablet (400 mg total) by mouth 2 (two) times daily. (Patient taking differently: Take 400 mg by mouth as needed.), Disp: 15 tablet, Rfl: 0   naphazoline-pheniramine (NAPHCON-A) 0.025-0.3 % ophthalmic solution, Place 1 drop into the left  eye 2 (two) times daily as needed for irritation or allergies., Disp: , Rfl:    nitroGLYCERIN (NITROSTAT) 0.4 MG SL tablet, Place 1 tablet (0.4 mg total) under the tongue every 5 (five) minutes as needed for chest pain., Disp: 25 tablet, Rfl: 2   pantoprazole (PROTONIX) 40 MG tablet, TAKE ONE TABLET TWICE DAILY, Disp: 180 tablet, Rfl: 0   Respiratory Therapy Supplies (FLUTTER) DEVI, Twice a day and prn as needed, may increase if feeling worse, Disp: 1 each, Rfl: 0   sodium chloride HYPERTONIC 3 % nebulizer solution, Take by nebulization as needed for other. (Patient taking differently: Take 4 mLs by nebulization daily.), Disp: 750 mL, Rfl: 12   sucralfate (CARAFATE) 1 g tablet, Take 1 tablet three times daily as needed, Disp: 270 tablet, Rfl: 3   tamsulosin (FLOMAX) 0.4 MG CAPS capsule, Take 1 capsule (0.4 mg total) by mouth daily after supper., Disp: 90 capsule, Rfl: 3   triamcinolone cream (KENALOG) 0.1 %, Apply 1 application topically 2 (two) times daily. (Patient taking differently: Apply 1 application topically 2 (two) times daily as needed (itching).), Disp: 30 g, Rfl: 0   vitamin B-12 (CYANOCOBALAMIN) 1000 MCG tablet, Take 1,000 mcg by mouth daily., Disp: , Rfl:    vitamin E 180 MG (400 UNITS) capsule, Take 400 Units by mouth daily., Disp: , Rfl:   Allergies  Allergen Reactions   Gadavist [Gadobutrol] Hives   Prednisone     Agitation and hallucinations   Betadine [Povidone Iodine] Other (See Comments)    Blisters    Lortab [Hydrocodone-Acetaminophen] Nausea And Vomiting   Penicillins Other (See Comments)    Lightheadedness  Has patient had a PCN reaction causing immediate rash, facial/tongue/throat swelling, SOB or lightheadedness with hypotension: Yes Has patient had a PCN reaction causing severe rash involving mucus membranes or skin necrosis: No Has patient had a PCN reaction that required hospitalization No Has patient had a PCN reaction occurring within the last 10 years: No If  all of the above answers are "NO", then may proceed with Cephalosporin use.   Zetia [Ezetimibe] Other (See Comments)    Muscle weakness    Zocor [Simvastatin] Nausea Only and Other (See Comments)    Muscle weakness    Past Medical History:  Diagnosis Date   Abnormality of gait 06/07/2013   Anemia    Aneurysm ([place])    on assending aorta, currently watching it, Dr Cyndia Bent   Anxiety    Arthritis    Asthma    Atrial fibrillation ([place])    B12 deficiency    Barrett's esophagus    CAD (coronary artery disease) 03/2006   3.0 x 20 mm TAXUS Perseus DES to the LAD; 01/2007  L main 30%, oLAD 50%, pLAD stent ok, CFX 80%, OM 60%, pRCA 60%, mRCA 70%, oPDA 90%; med rx    Cancer ([place])    skin cancer    Cataract    bilateral removal of cateracts  COLONIC POLYPS 07/17/2008   Qualifier: Diagnosis of  By: Lovette Cliche, CNA, Christy     Complication of anesthesia     no issues,but pt prefers spinal due to Pulmonary problems   COPD (chronic obstructive pulmonary disease) ([place])    oxygen  on standby in home.   Depression    Diverticulosis    Emphysema    Enlarged prostate with urinary retention    Esophageal stenosis    Gastric ulcer    GERD (gastroesophageal reflux disease)    Hiatal hernia    Hypothyroidism    Neuropathy    Osteoporosis    Pituitary macroadenoma ([facility])    Restless legs syndrome (RLS)    Tubular adenoma of colon    UGIB (upper gastrointestinal bleed) 03/2013   EGD w/ large ulcer at GE junction    Observations/Objective: A&O  No respiratory distress or wheezing audible over the phone Mood, judgement, and thought processes all WNL  Assessment and Plan: 1. COPD without exacerbation ([place]) Encouraged patient to use Breztri and mucinex twice daily. Explained that his symptoms are somewhat expected since he has COPD as this is irreversible lung damage and the medications are meant to keep him from getting worse. He is going to try taking medications as discussed and let us know if he  gets worse.    Follow Up Instructions:  I discussed the assessment and treatment plan with the patient. The patient was provided an opportunity to ask questions and all were answered. The patient agreed with the plan and demonstrated an understanding of the instructions.   The patient was advised to call back or seek an in-person evaluation if the symptoms worsen or if the condition fails to improve as anticipated.  The above assessment and management plan was discussed with the patient. The patient verbalized understanding of and has agreed to the management plan. Patient is aware to call the clinic if symptoms persist or worsen. Patient is aware when to return to the clinic for a follow-up visit. Patient educated on when it is appropriate to go to the emergency department.   Time call ended: 11:30 AM  I provided 16 minutes of non-face-to-face time during this encounter.  Hendricks Limes, MSN, APRN, FNP-C [facility] 03/31/21

## 2021-04-02 ENCOUNTER — Telehealth: Payer: Self-pay | Admitting: Family Medicine

## 2021-04-02 MED ORDER — GUAIFENESIN ER 600 MG PO TB12: 600.0000 mg | ORAL_TABLET | Freq: Every day | ORAL | 3 refills | Status: DC

## 2021-04-02 NOTE — Telephone Encounter (Signed)
Sent in 600mg  #28 with 3 refills to [facility] under [facility] since she advised pt on 2/1 visit to take twice daily.

## 2021-05-14 ENCOUNTER — Other Ambulatory Visit: Payer: Self-pay | Admitting: *Deleted

## 2021-05-14 DIAGNOSIS — J449 Chronic obstructive pulmonary disease, unspecified: Secondary | ICD-10-CM

## 2021-05-14 DIAGNOSIS — R053 Chronic cough: Secondary | ICD-10-CM

## 2021-05-14 DIAGNOSIS — J683 Other acute and subacute respiratory conditions due to chemicals, gases, fumes and vapors: Secondary | ICD-10-CM

## 2021-05-27 ENCOUNTER — Ambulatory Visit: Payer: Medicare Other | Admitting: Family Medicine

## 2021-05-28 ENCOUNTER — Ambulatory Visit (INDEPENDENT_AMBULATORY_CARE_PROVIDER_SITE_OTHER): Payer: Medicare Other | Admitting: Family Medicine

## 2021-05-28 ENCOUNTER — Other Ambulatory Visit: Payer: Self-pay | Admitting: Surgery

## 2021-05-28 ENCOUNTER — Encounter: Payer: Self-pay | Admitting: Family Medicine

## 2021-05-28 VITALS — BP 96/71 | HR 87 | Temp 98.4°F | Ht 65.0 in | Wt 161.0 lb

## 2021-05-28 DIAGNOSIS — I480 Paroxysmal atrial fibrillation: Secondary | ICD-10-CM

## 2021-05-28 DIAGNOSIS — I7121 Aneurysm of the ascending aorta, without rupture: Secondary | ICD-10-CM

## 2021-05-28 DIAGNOSIS — J411 Mucopurulent chronic bronchitis: Secondary | ICD-10-CM | POA: Diagnosis not present

## 2021-05-28 DIAGNOSIS — I77819 Aortic ectasia, unspecified site: Secondary | ICD-10-CM

## 2021-05-28 DIAGNOSIS — R062 Wheezing: Secondary | ICD-10-CM | POA: Diagnosis not present

## 2021-05-28 MED ORDER — GUAIFENESIN ER 600 MG PO TB12: 600.0000 mg | ORAL_TABLET | Freq: Two times a day (BID) | ORAL | 3 refills | Status: AC

## 2021-05-28 MED ORDER — METHYLPREDNISOLONE ACETATE 40 MG/ML IJ SUSP
40.0000 mg | Freq: Once | INTRAMUSCULAR | Status: AC
  Administered 2021-05-28: 40 mg via INTRAMUSCULAR

## 2021-05-28 NOTE — Progress Notes (Signed)
?  ? ?Subjective:  ?Patient ID: [name], male    DOB: 03-Apr-1932, 88 y.o.   MRN: [id number] ? ?Patient Care Team: ?Dettinger, [name], MD as PCP - General (Family Medicine) ?Minus Breeding, MD as PCP - Cardiology (Cardiology) ?Renato Shin, MD as Consulting Physician (Endocrinology) ?Glenna Fellows, MD as Attending Physician (Neurosurgery) ?Tanda Rockers, MD as Consulting Physician (Pulmonary Disease) ?Gaye Pollack, MD as Consulting Physician (Cardiothoracic Surgery) ?McKenzie, Candee Furbish, MD as Consulting Physician (Urology)  ? ?Chief Complaint:  Shortness of Breath and Aneurysm ? ? ?HPI: ?PURCELL JUNGBLUTH is a 88 y.o. male presenting on 05/28/2021 for Shortness of Breath and Aneurysm ? ? ?Pt presents today with complaints of ongoing intermittent shortness of breath. States this is worse when climbing the stairs or with significant exertion. Relieved by rest. No increased sputum production or cough. States he does use COPD medications as prescribed. Has not been taking Mucinex as prescribed. He denies recent illnesses.  ?He is also concerned about his aneurysm. States he is concerned this could be causing his shortness of breath. He is followed by Dr. Cyndia Bent who has ordered a repeat CT for follow up evaluation, pt has not had imaging completed. He denies chest pain, fatigue, palpitations, dizziness, weakness, leg pain or weakness, or syncope.  ? ? ? ?Relevant past medical, surgical, family, and social history reviewed and updated as indicated.  ?Allergies and medications reviewed and updated. Data reviewed: Chart in Epic. ? ? ?Past Medical History:  ?Diagnosis Date  ? Abnormality of gait 06/07/2013  ? Anemia   ? Aneurysm ([place])   ? on assending aorta, currently watching it, Dr Cyndia Bent  ? Anxiety   ? Arthritis   ? Asthma   ? Atrial fibrillation ([place])   ? B12 deficiency   ? Barrett's esophagus   ? CAD (coronary artery disease) 03/2006  ? 3.0 x 20 mm TAXUS Perseus DES to the LAD; 01/2007  L main 30%, oLAD 50%,  pLAD stent ok, CFX 80%, OM 60%, pRCA 60%, mRCA 70%, oPDA 90%; med rx   ? Cancer [facility])   ? skin cancer   ? Cataract   ? bilateral removal of cateracts  ? COLONIC POLYPS 07/17/2008  ? Qualifier: Diagnosis of  By: Lovette Cliche CNA, Christy    ? Complication of anesthesia   ?  no issues,but pt prefers spinal due to Pulmonary problems  ? COPD (chronic obstructive pulmonary disease) ([place])   ? oxygen  on standby in home.  ? Depression   ? Diverticulosis   ? Emphysema   ? Enlarged prostate with urinary retention   ? Esophageal stenosis   ? Gastric ulcer   ? GERD (gastroesophageal reflux disease)   ? Hiatal hernia   ? Hypothyroidism   ? Neuropathy   ? Osteoporosis   ? Pituitary macroadenoma ([place])   ? Restless legs syndrome (RLS)   ? Tubular adenoma of colon   ? UGIB (upper gastrointestinal bleed) 03/2013  ? EGD w/ large ulcer at GE junction  ? ? ?Past Surgical History:  ?Procedure Laterality Date  ? ABDOMINAL HERNIA REPAIR   2008  ? BIOPSY  06/13/2020  ? Procedure: BIOPSY;  Surgeon: Harvel Quale, MD;  [facility];  Service: Gastroenterology;;  gastric  ? CARDIAC CATHETERIZATION  01/2007  ? L main 30%, oLAD 50%,  pLAD stent ok, CFX 80%, OM 60%, pRCA 60%, mRCA 70%, oPDA 90%; med rx  ? cataract extraction both eyes    ?  COLONOSCOPY    ? COLONOSCOPY WITH PROPOFOL N/A 06/13/2020  ? Procedure: COLONOSCOPY WITH PROPOFOL;  Surgeon: Harvel Quale, MD;  [facility];  Service: Gastroenterology;  Laterality: N/A;  ? CORONARY ANGIOPLASTY    ? CORONARY STENT PLACEMENT  03/2006  ? 3.0 x 20 mm TAXUS Perseus DES to the LAD  ? CYSTOSCOPY N/A 06/10/2015  ? Procedure: CYSTOSCOPY FULGRATION OF BLEEDING,  electovapor resection;  Surgeon: Irine Seal, MD;  [facility]: [facility];  Service: Urology;  Laterality: N/A;  ? CYSTOSCOPY WITH INSERTION OF UROLIFT N/A 06/01/2015  ? Procedure: CYSTOSCOPY WITH INSERTION OF UROLIFT x4;  Surgeon: Irine Seal, MD;  [facility];  Service: Urology;  Laterality: N/A;  ?  ESOPHAGOGASTRODUODENOSCOPY N/A 04/20/2013  ? Procedure: ESOPHAGOGASTRODUODENOSCOPY (EGD);  Surgeon: Inda Castle, MD;  [facility];  Service: Endoscopy;  Laterality: N/A;  ? ESOPHAGOGASTRODUODENOSCOPY N/A 03/25/2016  ? Procedure: ESOPHAGOGASTRODUODENOSCOPY (EGD);  Surgeon: Irene Shipper, MD;  Location[facility];  Service: Endoscopy;  Laterality: N/A;  ? ESOPHAGOGASTRODUODENOSCOPY (EGD) WITH PROPOFOL N/A 10/13/2015  ? Procedure: ESOPHAGOGASTRODUODENOSCOPY (EGD) WITH PROPOFOL;  Surgeon: Jerene Bears, MD;  [facility];  Service: Gastroenterology;  Laterality: N/A;  ? ESOPHAGOGASTRODUODENOSCOPY (EGD) WITH PROPOFOL N/A 06/13/2020  ? Procedure: ESOPHAGOGASTRODUODENOSCOPY (EGD) WITH PROPOFOL;  Surgeon: Harvel Quale, MD;  [facility];  Service: Gastroenterology;  Laterality: N/A;  ? FOOT SURGERY  1994 left, 2002 right foot  ? bilateral foot reconstruciton  ? FOOT SURGERY    ? reconstruction of both feet- no retained hardware.  ? HERNIA REPAIR    ? HIATAL HERNIA REPAIR  01-04-2008  ? JOINT REPLACEMENT    ? MELANOMA EXCISION  2019  ? POLYPECTOMY    ? POLYPECTOMY  06/13/2020  ? Procedure: POLYPECTOMY;  Surgeon: Harvel Quale, MD;  [facility];  Service: Gastroenterology;;  ascending,   ? PROSTATE SURGERY    ? x 2  ? SAVORY DILATION N/A 03/25/2016  ? Procedure: SAVORY DILATION;  Surgeon: Irene Shipper, MD;  [facility];  Service: Endoscopy;  Laterality: N/A;  ? TOTAL HIP ARTHROPLASTY  07/21/2011  ? Procedure: TOTAL HIP ARTHROPLASTY ANTERIOR APPROACH;  Surgeon: Mauri Pole, MD;  [facility];  Service: Orthopedics;  Laterality: Left;  ? TOTAL HIP ARTHROPLASTY Right 09/07/2012  ? Procedure: RIGHT TOTAL HIP ARTHROPLASTY ANTERIOR APPROACH;  Surgeon: Mcarthur Rossetti, MD;  [facility];  Service: Orthopedics;  Laterality: Right;  ? TRANSURETHRAL RESECTION OF BLADDER NECK N/A 11/04/2015  ? Procedure: RESECTION OF BLADDER NECK;  Surgeon:  Cleon Gustin, MD;  [facility];  Service: Urology;  Laterality: N/A;  ? TRANSURETHRAL RESECTION OF PROSTATE N/A 11/04/2015  ? Procedure: TRANSURETHRAL RESECTION OF THE PROSTATE (TURP); REMOVAL OF UROLIFT IMPLANTS X THREE;  Surgeon: Cleon Gustin, MD;  [facility];  Service: Urology;  Laterality: N/A;  ? UPPER GASTROINTESTINAL ENDOSCOPY  12/2013  ? Dr Hilarie Fredrickson, gastritis  ? VIDEO BRONCHOSCOPY Bilateral 02/01/2017  ? Procedure: VIDEO BRONCHOSCOPY WITHOUT FLUORO;  Surgeon: Tanda Rockers, MD;  [facility];  Service: Cardiopulmonary;  Laterality: Bilateral;  ? ? ?Social History  ? ?Socioeconomic History  ? Marital status: Widowed  ?  Spouse name: Not on file  ? Number of children: 2  ? Years of education: 65  ? Highest education level: Associate degree: occupational, Hotel manager, or vocational program  ?Occupational History  ? Occupation: retired  ?Tobacco Use  ? Smoking status: Never  ? Smokeless  tobacco: Never  ?Vaping Use  ? Vaping Use: Never used  ?Substance and Sexual Activity  ? Alcohol use: No  ? Drug use: No  ? Sexual activity: Not Currently  ?  Birth control/protection: None  ?Other Topics Concern  ? Not on file  ?Social History Narrative  ? Patient drinks caffeine a few times a week.  ? Patient is right handed.  ? ?Social Determinants of Health  ? ?Financial Resource Strain: Low Risk   ? Difficulty of Paying Living Expenses: Not hard at all  ?Food Insecurity: No Food Insecurity  ? Worried About Charity fundraiser in the Last Year: Never true  ? Ran Out of Food in the Last Year: Never true  ?Transportation Needs: No Transportation Needs  ? Lack of Transportation (Medical): No  ? Lack of Transportation (Non-Medical): No  ?Physical Activity: Insufficiently Active  ? Days of Exercise per Week: 7 days  ? Minutes of Exercise per Session: 20 min  ?Stress: No Stress Concern Present  ? Feeling of Stress : Not at all  ?Social Connections: Moderately Integrated  ? Frequency of Communication  with Friends and Family: More than three times a week  ? Frequency of Social Gatherings with Friends and Family: More than three times a week  ? Attends Religious Services: More than 4 times per year  ? Active Member of

## 2021-05-28 NOTE — Patient Instructions (Signed)
Call [facility] to schedule CT scan that was ordered by Dr. Cyndia Bent.  ?

## 2021-06-07 ENCOUNTER — Ambulatory Visit (INDEPENDENT_AMBULATORY_CARE_PROVIDER_SITE_OTHER): Payer: Medicare Other | Admitting: Family Medicine

## 2021-06-07 ENCOUNTER — Encounter: Payer: Self-pay | Admitting: Family Medicine

## 2021-06-07 VITALS — BP 121/65 | HR 73 | Temp 97.6°F | Ht 65.0 in | Wt 161.8 lb

## 2021-06-07 DIAGNOSIS — K5909 Other constipation: Secondary | ICD-10-CM

## 2021-06-07 DIAGNOSIS — J449 Chronic obstructive pulmonary disease, unspecified: Secondary | ICD-10-CM

## 2021-06-07 MED ORDER — METAMUCIL 28 % PO PACK: 1.0000 | PACK | Freq: Every day | ORAL | 1 refills | Status: AC

## 2021-06-07 NOTE — Patient Instructions (Signed)
Chronic Obstructive Pulmonary Disease ?Chronic obstructive pulmonary disease (COPD) is a long-term (chronic) condition that affects the lungs. COPD is a general term that can be used to describe many different lung problems that cause lung inflammation and limit airflow, including chronic bronchitis and emphysema. ?If you have COPD, your lung function will probably never return to normal. In most cases, it gets worse over time. However, there are steps you can take to slow the progression of the disease and improve your quality of life. ?What are the causes? ?This condition may be caused by: ?Smoking. This is the most common cause. ?Certain genes passed down through families. ?What increases the risk? ?The following factors may make you more likely to develop this condition: ?Being exposed to secondhand smoke from cigarettes, pipes, or cigars. ?Being exposed to chemicals and other irritants, such as fumes and dust in the work environment. ?Having chronic lung conditions or infections. ?What are the signs or symptoms? ?Symptoms of this condition include: ?Shortness of breath, especially during physical activity. ?Chronic cough with a large amount of thick mucus. Sometimes, the cough may not have any mucus (dry cough). ?Wheezing and rapid breathing. ?Gray or bluish discoloration (cyanosis) of the skin, especially in the fingers, toes, or lips. ?Feeling tired (fatigue). ?Weight loss. ?Chest tightness. ?Frequent infections. ?Episodes when breathing symptoms become much worse (exacerbations). ?At the later stages of this disease, you may have swelling in the ankles, feet, or legs. ?How is this diagnosed? ?This condition is diagnosed based on: ?Your medical history. ?A physical exam. ?You may also have tests, including: ?Lung (pulmonary) function tests. This may include a spirometry test, which measures your ability to exhale properly. ?Chest X-ray. ?CT scan. ?Blood tests. ?How is this treated? ?This condition may be  treated with: ?Medicines. These may include inhaled rescue medicines to treat acute exacerbations as well as medicines that you take long-term (maintenance medicines) to prevent flare-ups of COPD. ?Bronchodilators help treat COPD by dilating the airways to allow increased airflow and make your breathing more comfortable. ?Steroids can reduce airway inflammation and help prevent exacerbations. ?Smoking cessation. If you smoke, your health care provider may ask you to quit, and may also recommend therapy or replacement products to help you quit. ?Pulmonary rehabilitation. This may involve working with a team of health care providers and specialists, such as respiratory, occupational, and physical therapists. ?Exercise and physical activity. These are beneficial for nearly all people with COPD. ?Nutrition therapy to gain weight, if you are underweight. ?Oxygen. Supplemental oxygen therapy is only helpful if you have a low oxygen level in your blood (hypoxemia). ?Lung surgery or transplant. ?Palliative care. This is to help people with COPD feel comfortable when treatment is no longer working. ?Follow these instructions at home: ?Medicines ?Take over-the-counter and prescription medicines only as told by your health care provider. This includes inhaled medicines and pills. ?Talk to your health care provider before taking any cough or allergy medicines. You may need to avoid certain medicines that dry out your airways. ?Lifestyle ?If you smoke, the most important thing that you can do is to stop smoking. Continuing to smoke will cause the disease to progress faster. ?Do not use any products that contain nicotine or tobacco. These products include cigarettes, chewing tobacco, and vaping devices, such as e-cigarettes. If you need help quitting, ask your health care provider. ?Avoid exposure to things that irritate your lungs, such as smoke, chemicals, and fumes. ?Stay active, but balance activity with periods of rest.  Exercise and physical   activity will help you maintain your ability to do things you want to do. ?Learn and use relaxation techniques to manage stress and to control your breathing. ?Get the right amount of sleep and get quality sleep. Most adults need 7 or more hours per night. ?Eat healthy foods. Eating smaller, more frequent meals and resting before meals may help you maintain your strength. ?Controlled breathing ?Learn and use controlled breathing techniques as directed by your health care provider. Controlled breathing techniques include: ?Pursed lip breathing. Start by breathing in (inhaling) through your nose for 1 second. Then, purse your lips as if you were going to whistle and breathe out (exhale) through the pursed lips for 2 seconds. ?Diaphragmatic breathing. Start by putting one hand on your abdomen just above your waist. Inhale slowly through your nose. The hand on your abdomen should move out. Then purse your lips and exhale slowly. You should be able to feel the hand on your abdomen moving in as you exhale. ? ?Controlled coughing ?Learn and use controlled coughing to clear mucus from your lungs. Controlled coughing is a series of short, progressive coughs. The steps of controlled coughing are: ?Lean your head slightly forward. ?Breathe in deeply using diaphragmatic breathing. ?Try to hold your breath for 3 seconds. ?Keep your mouth slightly open while coughing twice. ?Spit any mucus out into a tissue. ?Rest and repeat the steps once or twice as needed. ?General instructions ?Make sure you receive all the vaccines that your health care provider recommends, especially the pneumococcal and influenza vaccines. Preventing infection and hospitalization is very important when you have COPD. ?Drink enough fluid to keep your urine pale yellow, unless you have a medical condition that requires fluid restriction. ?Use oxygen therapy and pulmonary rehabilitation if told by your health care provider. If you  require home oxygen therapy, ask your health care provider whether you should purchase a pulse oximeter to measure your oxygen level at home. ?Work with your health care provider to develop a COPD action plan. This will help you know what steps to take if your condition gets worse. ?Keep other chronic health conditions under control as told by your health care provider. ?Avoid extreme temperature and humidity changes. ?Avoid contact with people who have an illness that spreads from person to person (is contagious), such as viral infections or pneumonia. ?Keep all follow-up visits. This is important. ?Contact a health care provider if: ?You are coughing up more mucus than usual. ?There is a change in the color or thickness of your mucus. ?Your breathing is more labored than usual. ?Your breathing is faster than usual. ?You have difficulty sleeping. ?You need to use your rescue medicines or inhalers more often than expected. ?You have trouble doing routine activities such as getting dressed or walking around the house. ?Get help right away if: ?You have shortness of breath while you are resting. ?You have shortness of breath that prevents you from: ?Being able to talk. ?Performing your usual physical activities. ?You have chest pain lasting longer than 5 minutes. ?Your skin color is more blue (cyanotic) than usual. ?You measure low oxygen saturations for longer than 5 minutes with a pulse oximeter. ?You have a fever. ?You feel too tired to breathe normally. ?These symptoms may represent a serious problem that is an emergency. Do not wait to see if the symptoms will go away. Get medical help right away. Call your local emergency services (911 in the [place] Do not drive yourself to the hospital. ?Summary ?Chronic obstructive pulmonary   disease (COPD) is a long-term (chronic) condition that affects the lungs. ?Your lung function will probably never return to normal. In most cases, it gets worse over time. However, there  are steps you can take to slow the progression of the disease and improve your quality of life. ?Treatment for COPD may include taking medicines, quitting smoking, pulmonary rehabilitation, and changes to diet and e

## 2021-06-07 NOTE — Progress Notes (Signed)
? ?Established Patient Office Visit ? ?Subjective:  ?Patient ID: [name], male    DOB: 05-Apr-1932  Age: 88 y.o. MRN: [id number] ? ?CC:  ?Chief Complaint  ?Patient presents with  ? COPD  ?  1 week follow up  ? ? ?HPI ?[name] presents for COPD follow up. He reports that his symptoms are about the same. He reports that sputum production and cough is also about the same. He feels sort of breath but only when going up and down his stairs. He can walk about 300 ft to his mailbox and back without getting short of breath. He has wheezing mostly at night. He has not been using albuterol for this. He is using maintenance inhaler daily. Denies fever or chest pain.  ? ?He also reports chronic constipation. He takes miralax as needed for this but would like to know other things to do to help on a daily basis. Denies nausea, vomiting, diarrhea, or abdominal pain.  ? ?Past Medical History:  ?Diagnosis Date  ? Abnormality of gait 06/07/2013  ? Anemia   ? Aneurysm ([place])   ? on assending aorta, currently watching it, Dr Cyndia Bent  ? Anxiety   ? Arthritis   ? Asthma   ? Atrial fibrillation ([place])   ? B12 deficiency   ? Barrett's esophagus   ? CAD (coronary artery disease) 03/2006  ? 3.0 x 20 mm TAXUS Perseus DES to the LAD; 01/2007  L main 30%, oLAD 50%, pLAD stent ok, CFX 80%, OM 60%, pRCA 60%, mRCA 70%, oPDA 90%; med rx   ? Cancer [facility])   ? skin cancer   ? Cataract   ? bilateral removal of cateracts  ? COLONIC POLYPS 07/17/2008  ? Qualifier: Diagnosis of  By: Lovette Cliche CNA, Christy    ? Complication of anesthesia   ?  no issues,but pt prefers spinal due to Pulmonary problems  ? COPD (chronic obstructive pulmonary disease) ([place])   ? oxygen  on standby in home.  ? Depression   ? Diverticulosis   ? Emphysema   ? Enlarged prostate with urinary retention   ? Esophageal stenosis   ? Gastric ulcer   ? GERD (gastroesophageal reflux disease)   ? Hiatal hernia   ? Hypothyroidism   ? Neuropathy   ? Osteoporosis   ? Pituitary  macroadenoma ([place])   ? Restless legs syndrome (RLS)   ? Tubular adenoma of colon   ? UGIB (upper gastrointestinal bleed) 03/2013  ? EGD w/ large ulcer at GE junction  ? ? ?Past Surgical History:  ?Procedure Laterality Date  ? ABDOMINAL HERNIA REPAIR   2008  ? BIOPSY  06/13/2020  ? Procedure: BIOPSY;  Surgeon: Harvel Quale, MD;  [facility];  Service: Gastroenterology;;  gastric  ? CARDIAC CATHETERIZATION  01/2007  ? L main 30%, oLAD 50%,  pLAD stent ok, CFX 80%, OM 60%, pRCA 60%, mRCA 70%, oPDA 90%; med rx  ? cataract extraction both eyes    ? COLONOSCOPY    ? COLONOSCOPY WITH PROPOFOL N/A 06/13/2020  ? Procedure: COLONOSCOPY WITH PROPOFOL;  Surgeon: Harvel Quale, MD;  [facility];  Service: Gastroenterology;  Laterality: N/A;  ? CORONARY ANGIOPLASTY    ? CORONARY STENT PLACEMENT  03/2006  ? 3.0 x 20 mm TAXUS Perseus DES to the LAD  ? CYSTOSCOPY N/A 06/10/2015  ? Procedure: CYSTOSCOPY FULGRATION OF BLEEDING,  electovapor resection;  Surgeon: Irine Seal, MD;  [facility]: [facility];  Service:  Urology;  Laterality: N/A;  ? CYSTOSCOPY WITH INSERTION OF UROLIFT N/A 06/01/2015  ? Procedure: CYSTOSCOPY WITH INSERTION OF UROLIFT x4;  Surgeon: Irine Seal, MD;  [facility];  Service: Urology;  Laterality: N/A;  ? ESOPHAGOGASTRODUODENOSCOPY N/A 04/20/2013  ? Procedure: ESOPHAGOGASTRODUODENOSCOPY (EGD);  Surgeon: Inda Castle, MD;  [facility];  Service: Endoscopy;  Laterality: N/A;  ? ESOPHAGOGASTRODUODENOSCOPY N/A 03/25/2016  ? Procedure: ESOPHAGOGASTRODUODENOSCOPY (EGD);  Surgeon: Irene Shipper, MD;  Location[facility];  Service: Endoscopy;  Laterality: N/A;  ? ESOPHAGOGASTRODUODENOSCOPY (EGD) WITH PROPOFOL N/A 10/13/2015  ? Procedure: ESOPHAGOGASTRODUODENOSCOPY (EGD) WITH PROPOFOL;  Surgeon: Jerene Bears, MD;  [facility];  Service: Gastroenterology;  Laterality: N/A;  ? ESOPHAGOGASTRODUODENOSCOPY (EGD) WITH PROPOFOL N/A 06/13/2020  ? Procedure:  ESOPHAGOGASTRODUODENOSCOPY (EGD) WITH PROPOFOL;  Surgeon: Harvel Quale, MD;  [facility];  Service: Gastroenterology;  Laterality: N/A;  ? FOOT SURGERY  1994 left, 2002 right foot  ? bilateral foot reconstruciton  ? FOOT SURGERY    ? reconstruction of both feet- no retained hardware.  ? HERNIA REPAIR    ? HIATAL HERNIA REPAIR  01-04-2008  ? JOINT REPLACEMENT    ? MELANOMA EXCISION  2019  ? POLYPECTOMY    ? POLYPECTOMY  06/13/2020  ? Procedure: POLYPECTOMY;  Surgeon: Harvel Quale, MD;  [facility];  Service: Gastroenterology;;  ascending,   ? PROSTATE SURGERY    ? x 2  ? SAVORY DILATION N/A 03/25/2016  ? Procedure: SAVORY DILATION;  Surgeon: Irene Shipper, MD;  [facility];  Service: Endoscopy;  Laterality: N/A;  ? TOTAL HIP ARTHROPLASTY  07/21/2011  ? Procedure: TOTAL HIP ARTHROPLASTY ANTERIOR APPROACH;  Surgeon: Mauri Pole, MD;  [facility];  Service: Orthopedics;  Laterality: Left;  ? TOTAL HIP ARTHROPLASTY Right 09/07/2012  ? Procedure: RIGHT TOTAL HIP ARTHROPLASTY ANTERIOR APPROACH;  Surgeon: Mcarthur Rossetti, MD;  [facility];  Service: Orthopedics;  Laterality: Right;  ? TRANSURETHRAL RESECTION OF BLADDER NECK N/A 11/04/2015  ? Procedure: RESECTION OF BLADDER NECK;  Surgeon: Cleon Gustin, MD;  [facility];  Service: Urology;  Laterality: N/A;  ? TRANSURETHRAL RESECTION OF PROSTATE N/A 11/04/2015  ? Procedure: TRANSURETHRAL RESECTION OF THE PROSTATE (TURP); REMOVAL OF UROLIFT IMPLANTS X THREE;  Surgeon: Cleon Gustin, MD;  [facility];  Service: Urology;  Laterality: N/A;  ? UPPER GASTROINTESTINAL ENDOSCOPY  12/2013  ? Dr Hilarie Fredrickson, gastritis  ? VIDEO BRONCHOSCOPY Bilateral 02/01/2017  ? Procedure: VIDEO BRONCHOSCOPY WITHOUT FLUORO;  Surgeon: Tanda Rockers, MD;  Location: [facility];  Service: Cardiopulmonary;  Laterality: Bilateral;  ? ? ?Family History  ?Problem Relation Age of Onset  ? Heart disease Sister   ? Heart  failure Father   ? Other Mother   ?     phebitis related to Franki's birth, he was 45 weeks old when she died  ? Colon cancer Neg Hx   ? Esophageal cancer Neg Hx   ? Rectal cancer Neg Hx   ? Stomach cancer Neg Hx   ? Colon polyps Neg Hx   ? ? ?Social History  ? ?Socioeconomic History  ? Marital status: Widowed  ?  Spouse name: Not on file  ? Number of children: 2  ? Years of education: 76  ? Highest education level: Associate degree: occupational, Hotel manager, or vocational program  ?Occupational History  ? Occupation: retired  ?Tobacco Use  ? Smoking status: Never  ? Smokeless tobacco: Never  ?  Vaping Use  ? Vaping Use: Never used  ?Substance and Sexual Activity  ? Alcohol use: No  ? Drug use: No  ? Sexual activity: Not Currently  ?  Birth control/protection: None  ?Other Topics Concern  ? Not on file  ?Social History Narrative  ? Patient drinks caffeine a few times a week.  ? Patient is right handed.  ? ?Social Determinants of Health  ? ?Financial Resource Strain: Low Risk   ? Difficulty of Paying Living Expenses: Not hard at all  ?Food Insecurity: No Food Insecurity  ? Worried About Charity fundraiser in the Last Year: Never true  ? Ran Out of Food in the Last Year: Never true  ?Transportation Needs: No Transportation Needs  ? Lack of Transportation (Medical): No  ? Lack of Transportation (Non-Medical): No  ?Physical Activity: Insufficiently Active  ? Days of Exercise per Week: 7 days  ? Minutes of Exercise per Session: 20 min  ?Stress: No Stress Concern Present  ? Feeling of Stress : Not at all  ?Social Connections: Moderately Integrated  ? Frequency of Communication with Friends and Family: More than three times a week  ? Frequency of Social Gatherings with Friends and Family: More than three times a week  ? Attends Religious Services: More than 4 times per year  ? Active Member of Clubs or Organizations: Yes  ? Attends Archivist Meetings: More than 4 times per year  ? Marital Status: Widowed  ?Intimate  Partner Violence: Unknown  ? Fear of Current or Ex-Partner: No  ? Emotionally Abused: No  ? Physically Abused: No  ? Sexually Abused: Not on file  ? ? ?Outpatient Medications Prior to Visit  ?Medication Sig Dispense Refill

## 2021-06-22 ENCOUNTER — Ambulatory Visit: Payer: Medicare Other

## 2021-06-22 ENCOUNTER — Other Ambulatory Visit: Payer: Medicare Other

## 2021-06-22 NOTE — Progress Notes (Signed)
?  ?Cardiology Office Note ? ? ?Date:  06/23/2021  ? ?ID:  ELMO RIO, DOB 11-Jul-1932, MRN [id number] ? ?PCP:  Dettinger, [name], MD  ?Cardiologist:   Minus Breeding, MD ? ? ?Chief Complaint  ?Patient presents with  ? Atrial Fibrillation  ? ? ?  ?History of Present Illness: ?[name] is a 88 y.o. male who presents for who presents for followup of his known coronary disease. He had a negative stress perfusion study in 2017.   He wore a Holter.  There were PVCs, non sustained V tach and atrial tach and PVCs/PACs.   Echo at that time was essentially unremarkable.   He has an enlarged aorta of 47 mm.   ? ?He lives by himself.  His children live around but about an hour away he says.  He does have a lady friend who sees him.  He does some light household chores.  He gets on a stationary bicycle rarely.  He does some squats and some modified push-ups.  The patient denies any new symptoms such as chest discomfort, neck or arm discomfort. There has been no new shortness of breath, PND or orthopnea. There have been no reported palpitations, presyncope or syncope.  ? ? ?Past Medical History:  ?Diagnosis Date  ? Abnormality of gait 06/07/2013  ? Anemia   ? Aneurysm ([place])   ? on assending aorta, currently watching it, Dr Cyndia Bent  ? Anxiety   ? Arthritis   ? Asthma   ? Atrial fibrillation ([place])   ? B12 deficiency   ? Barrett's esophagus   ? CAD (coronary artery disease) 03/2006  ? 3.0 x 20 mm TAXUS Perseus DES to the LAD; 01/2007  L main 30%, oLAD 50%, pLAD stent ok, CFX 80%, OM 60%, pRCA 60%, mRCA 70%, oPDA 90%; med rx   ? Cancer [facility])   ? skin cancer   ? Cataract   ? bilateral removal of cateracts  ? COLONIC POLYPS 07/17/2008  ? Qualifier: Diagnosis of  By: Lovette Cliche CNA, Christy    ? Complication of anesthesia   ?  no issues,but pt prefers spinal due to Pulmonary problems  ? COPD (chronic obstructive pulmonary disease) ([place])   ? oxygen  on standby in home.  ? Depression   ? Diverticulosis   ? Emphysema   ? Enlarged  prostate with urinary retention   ? Esophageal stenosis   ? Gastric ulcer   ? GERD (gastroesophageal reflux disease)   ? Hiatal hernia   ? Hypothyroidism   ? Neuropathy   ? Osteoporosis   ? Pituitary macroadenoma ([place])   ? Restless legs syndrome (RLS)   ? Tubular adenoma of colon   ? UGIB (upper gastrointestinal bleed) 03/2013  ? EGD w/ large ulcer at GE junction  ? ? ?Past Surgical History:  ?Procedure Laterality Date  ? ABDOMINAL HERNIA REPAIR   2008  ? BIOPSY  06/13/2020  ? Procedure: BIOPSY;  Surgeon: Harvel Quale, MD;  [facility];  Service: Gastroenterology;;  gastric  ? CARDIAC CATHETERIZATION  01/2007  ? L main 30%, oLAD 50%,  pLAD stent ok, CFX 80%, OM 60%, pRCA 60%, mRCA 70%, oPDA 90%; med rx  ? cataract extraction both eyes    ? COLONOSCOPY    ? COLONOSCOPY WITH PROPOFOL N/A 06/13/2020  ? Procedure: COLONOSCOPY WITH PROPOFOL;  Surgeon: Harvel Quale, MD;  [facility];  Service: Gastroenterology;  Laterality: N/A;  ? CORONARY ANGIOPLASTY    ?  CORONARY STENT PLACEMENT  03/2006  ? 3.0 x 20 mm TAXUS Perseus DES to the LAD  ? CYSTOSCOPY N/A 06/10/2015  ? Procedure: CYSTOSCOPY FULGRATION OF BLEEDING,  electovapor resection;  Surgeon: Irine Seal, MD;  Location: [facility];  Service: Urology;  Laterality: N/A;  ? CYSTOSCOPY WITH INSERTION OF UROLIFT N/A 06/01/2015  ? Procedure: CYSTOSCOPY WITH INSERTION OF UROLIFT x4;  Surgeon: Irine Seal, MD;  [facility];  Service: Urology;  Laterality: N/A;  ? ESOPHAGOGASTRODUODENOSCOPY N/A 04/20/2013  ? Procedure: ESOPHAGOGASTRODUODENOSCOPY (EGD);  Surgeon: Inda Castle, MD;  [facility];  Service: Endoscopy;  Laterality: N/A;  ? ESOPHAGOGASTRODUODENOSCOPY N/A 03/25/2016  ? Procedure: ESOPHAGOGASTRODUODENOSCOPY (EGD);  Surgeon: Irene Shipper, MD;  Location: [facility];  Service: Endoscopy;  Laterality: N/A;  ? ESOPHAGOGASTRODUODENOSCOPY (EGD) WITH PROPOFOL N/A 10/13/2015  ? Procedure: ESOPHAGOGASTRODUODENOSCOPY (EGD)  WITH PROPOFOL;  Surgeon: Jerene Bears, MD;  [facility];  Service: Gastroenterology;  Laterality: N/A;  ? ESOPHAGOGASTRODUODENOSCOPY (EGD) WITH PROPOFOL N/A 06/13/2020  ? Procedure: ESOPHAGOGASTRODUODENOSCOPY (EGD) WITH PROPOFOL;  Surgeon: Harvel Quale, MD;  [facility];  Service: Gastroenterology;  Laterality: N/A;  ? FOOT SURGERY  1994 left, 2002 right foot  ? bilateral foot reconstruciton  ? FOOT SURGERY    ? reconstruction of both feet- no retained hardware.  ? HERNIA REPAIR    ? HIATAL HERNIA REPAIR  01-04-2008  ? JOINT REPLACEMENT    ? MELANOMA EXCISION  2019  ? POLYPECTOMY    ? POLYPECTOMY  06/13/2020  ? Procedure: POLYPECTOMY;  Surgeon: Harvel Quale, MD;  [facility];  Service: Gastroenterology;;  ascending,   ? PROSTATE SURGERY    ? x 2  ? SAVORY DILATION N/A 03/25/2016  ? Procedure: SAVORY DILATION;  Surgeon: Irene Shipper, MD;  [facility];  Service: Endoscopy;  Laterality: N/A;  ? TOTAL HIP ARTHROPLASTY  07/21/2011  ? Procedure: TOTAL HIP ARTHROPLASTY ANTERIOR APPROACH;  Surgeon: Mauri Pole, MD;  [facility];  Service: Orthopedics;  Laterality: Left;  ? TOTAL HIP ARTHROPLASTY Right 09/07/2012  ? Procedure: RIGHT TOTAL HIP ARTHROPLASTY ANTERIOR APPROACH;  Surgeon: Mcarthur Rossetti, MD;  [facility];  Service: Orthopedics;  Laterality: Right;  ? TRANSURETHRAL RESECTION OF BLADDER NECK N/A 11/04/2015  ? Procedure: RESECTION OF BLADDER NECK;  Surgeon: Cleon Gustin, MD;  [facility];  Service: Urology;  Laterality: N/A;  ? TRANSURETHRAL RESECTION OF PROSTATE N/A 11/04/2015  ? Procedure: TRANSURETHRAL RESECTION OF THE PROSTATE (TURP); REMOVAL OF UROLIFT IMPLANTS X THREE;  Surgeon: Cleon Gustin, MD;  [facility];  Service: Urology;  Laterality: N/A;  ? UPPER GASTROINTESTINAL ENDOSCOPY  12/2013  ? Dr Hilarie Fredrickson, gastritis  ? VIDEO BRONCHOSCOPY Bilateral 02/01/2017  ? Procedure: VIDEO BRONCHOSCOPY WITHOUT FLUORO;   Surgeon: Tanda Rockers, MD;  Location[facility];  Service: Cardiopulmonary;  Laterality: Bilateral;  ? ? ? ?Current Outpatient Medications  ?Medication Sig Dispense Refill  ? albuterol (VENTOLIN HFA) 108 (90 Base) MCG/ACT inhaler Inhale 2 puffs into the lungs every 6 (six) hours as needed for wheezing or shortness of breath. 8 g 0  ? Ascorbic Acid (VITAMIN C) 500 MG CAPS Take 1 capsule by mouth daily.    ? aspirin EC 81 MG tablet Take 1 tablet (81 mg total) by mouth daily. Swallow whole. 90 tablet 3  ? bisacodyl (DULCOLAX) 10 MG suppository Place 10 mg rectally as needed for moderate constipation.    ? bisacodyl (FLEET) 10 MG/30ML  ENEM Place 10 mg rectally daily as needed (severe constipation).    ? bromocriptine (PARLODEL) 2.5 MG tablet Take 0.5 tablets (1.25 mg total) by mouth 2 (two) times daily. 90 tablet 3  ? Budeson-Glycopyrrol-Formoterol (BREZTRI AEROSPHERE) 160-9-4.8 MCG/ACT AERO Inhale 2 puffs into the lungs 2 (two) times daily. 10.7 g 11  ? budesonide-formoterol (SYMBICORT) 160-4.5 MCG/ACT inhaler Inhale 2 puffs into the lungs in the morning and at bedtime. 10.2 g 6  ? cholecalciferol (VITAMIN D) 1000 UNITS tablet Take 1,000 Units by mouth daily.     ? diclofenac Sodium (VOLTAREN) 1 % GEL Apply 2 g topically 4 (four) times daily. (Patient taking differently: Apply 2 g topically 4 (four) times daily as needed (pain).) 50 g 1  ? diphenhydramine-acetaminophen (TYLENOL PM) 25-500 MG TABS tablet Take 1 tablet by mouth at bedtime as needed.    ? FEROSUL 325 (65 Fe) MG tablet TAKE (1) TABLET DAILY WITH BREAKFAST. 90 tablet 3  ? GAVILAX 17 GM/SCOOP powder DISSOLVE 17 GM SCOOP IN 8 OZ. OF WATER AND DRINK ONCE DAILY AS NEEDED FOR CONSTIPATION (Patient taking differently: Take 17 g by mouth. Every 3-4 days) 510 g 0  ? guaiFENesin (MUCINEX) 600 MG 12 hr tablet Take 1 tablet (600 mg total) by mouth 2 (two) times daily. 180 tablet 3  ? levothyroxine (SYNTHROID) 75 MCG tablet Take 1 tablet (75 mcg total) by mouth  daily. (Patient taking differently: Take 75 mcg by mouth daily before breakfast.) 90 tablet 3  ? magnesium oxide (MAG-OX) 400 (241.3 Mg) MG tablet Take 1 tablet (400 mg total) by mouth 2 (two) times daily. (

## 2021-06-23 ENCOUNTER — Encounter: Payer: Self-pay | Admitting: Cardiology

## 2021-06-23 ENCOUNTER — Ambulatory Visit: Payer: Medicare Other | Admitting: Cardiology

## 2021-06-23 VITALS — BP 110/70 | HR 66 | Ht 65.0 in | Wt 161.0 lb

## 2021-06-23 DIAGNOSIS — I480 Paroxysmal atrial fibrillation: Secondary | ICD-10-CM

## 2021-06-23 DIAGNOSIS — E785 Hyperlipidemia, unspecified: Secondary | ICD-10-CM | POA: Diagnosis not present

## 2021-06-23 DIAGNOSIS — I7121 Aneurysm of the ascending aorta, without rupture: Secondary | ICD-10-CM

## 2021-06-23 NOTE — Patient Instructions (Signed)
Medication Instructions:  The current medical regimen is effective;  continue present plan and medications.  *If you need a refill on your cardiac medications before your next appointment, please call your pharmacy*  Follow-Up: At [facility], you and your health needs are our priority.  As part of our continuing mission to provide you with exceptional heart care, we have created designated Provider Care Teams.  These Care Teams include your primary Cardiologist (physician) and Advanced Practice Providers (APPs -  Physician Assistants and Nurse Practitioners) who all work together to provide you with the care you need, when you need it.  We recommend signing up for the patient portal called "MyChart".  Sign up information is provided on this After Visit Summary.  MyChart is used to connect with patients for Virtual Visits (Telemedicine).  Patients are able to view lab/test results, encounter notes, upcoming appointments, etc.  Non-urgent messages can be sent to your provider as well.   To learn more about what you can do with MyChart, go to [email or web address].    Your next appointment:   1 year(s)  The format for your next appointment:   In Person  Provider:   James Hochrein, MD{   Important Information About Sugar       

## 2021-06-25 ENCOUNTER — Other Ambulatory Visit: Payer: Self-pay | Admitting: Family Medicine

## 2021-06-28 ENCOUNTER — Encounter: Payer: Self-pay | Admitting: Family Medicine

## 2021-06-28 ENCOUNTER — Ambulatory Visit (INDEPENDENT_AMBULATORY_CARE_PROVIDER_SITE_OTHER): Payer: Medicare Other

## 2021-06-28 ENCOUNTER — Ambulatory Visit (INDEPENDENT_AMBULATORY_CARE_PROVIDER_SITE_OTHER): Payer: Medicare Other | Admitting: Family Medicine

## 2021-06-28 VITALS — BP 103/61 | HR 82 | Temp 97.3°F | Ht 65.0 in | Wt 163.1 lb

## 2021-06-28 DIAGNOSIS — R042 Hemoptysis: Secondary | ICD-10-CM

## 2021-06-28 DIAGNOSIS — J441 Chronic obstructive pulmonary disease with (acute) exacerbation: Secondary | ICD-10-CM

## 2021-06-28 LAB — HEMOGLOBIN, FINGERSTICK: Hemoglobin: 10.2 g/dL — ABNORMAL LOW (ref 12.6–17.7)

## 2021-06-28 MED ORDER — LEVOFLOXACIN 750 MG PO TABS: 750.0000 mg | ORAL_TABLET | Freq: Every day | ORAL | 0 refills | Status: AC

## 2021-06-28 NOTE — Progress Notes (Signed)
? ?Acute Office Visit ? ?Subjective:  ? ?  ?Patient ID: [name], male    DOB: 1933/01/22, 88 y.o.   MRN: [id number] ? ?Chief Complaint  ?Patient presents with  ? Cough  ? ? ?HPI ?Patient is in today for coughing up blood yesterday.  ? ?History of COPD. Was using his flutter valve device frequently yesterday. The yesterday evening he coughed up some blood. He reports that this was about a tablespoon all together. Report sputum was white to gray this morning which is typically of him. He has been taking mucinex. Reports shortness of breath is baseline. He denies fever, chills, chest pain, or dizziness. He feels more fatigued than typical. He would like to have his hemoglobin checked because of this. He denies bloody emesis or blood in stool. He has been using breztri along with albuterol prn.  ? ?ROS ?As per HPI.  ? ?   ?Objective:  ?  ?BP 103/61   Pulse 82   Temp (!) 97.3 ?F (36.3 ?C) (Temporal)   Ht '5\' 5"'$  (1.651 m)   Wt 163 lb 2 oz (74 kg)   SpO2 96%   BMI 27.15 kg/m?  ? ? ?Physical Exam ?Vitals and nursing note reviewed.  ?Constitutional:   ?   General: He is not in acute distress. ?   Appearance: He is not ill-appearing, toxic-appearing or diaphoretic.  ?HENT:  ?   Nose: Nose normal.  ?   Mouth/Throat:  ?   Mouth: Mucous membranes are moist.  ?   Pharynx: Oropharynx is clear.  ?Eyes:  ?   Extraocular Movements: Extraocular movements intact.  ?   Pupils: Pupils are equal, round, and reactive to light.  ?Cardiovascular:  ?   Rate and Rhythm: Normal rate and regular rhythm.  ?   Heart sounds: Normal heart sounds. No murmur heard. ?Pulmonary:  ?   Effort: Pulmonary effort is normal.  ?   Breath sounds: Examination of the right-upper field reveals wheezing. Examination of the left-upper field reveals wheezing. Examination of the right-lower field reveals decreased breath sounds. Decreased breath sounds and wheezing present. No rales.  ?Abdominal:  ?   General: Bowel sounds are normal. There is no  distension.  ?   Palpations: Abdomen is soft.  ?   Tenderness: There is no abdominal tenderness. There is no guarding or rebound.  ?Musculoskeletal:  ?   Right lower leg: No edema.  ?   Left lower leg: No edema.  ?Skin: ?   General: Skin is warm and dry.  ?Neurological:  ?   Mental Status: He is alert and oriented to person, place, and time.  ?Psychiatric:     ?   Mood and Affect: Mood normal.     ?   Behavior: Behavior normal.  ? ? ?No results found for any visits on 06/28/21. ? ? ?   ?Assessment & Plan:  ? ?[name] was seen today for cough. ? ?Diagnoses and all orders for this visit: ? ?Cough with hemoptysis ?COPD with exacerbation ([place]) ?Xray with likely atelectasis to right lung base. Will treat with levaquin for exacerbation as below. Hgb 10.2 today if office, CBC pending. Discussed may need daily iron supplement pending results. Strict return precautions. Discussed will need CT if hemoptysis continues.  ?-     DG Chest 2 View; Future ?-     CBC with Differential/Platelet ?-     Hemoglobin, fingerstick ?-     levofloxacin (LEVAQUIN) 750 MG tablet; Take 1  tablet (750 mg total) by mouth daily for 7 days. ? ? ?Return if symptoms worsen or fail to improve. ? ?The patient indicates understanding of these issues and agrees with the plan. ? ?Gwenlyn Perking, FNP ? ? ?

## 2021-06-28 NOTE — Patient Instructions (Signed)
Hemoptysis ?Hemoptysis is coughing up blood. With mild hemoptysis, you may cough up small amounts of blood-streaked saliva and mucus from your lungs (sputum). With severe hemoptysis, you may cough up a lot of blood. Coughing up 1-2 cups (240-480 mL) of blood within 24 hours is a medical emergency. ?Common causes of mild (nonmassive) hemoptysis include: ?An infection in your nose, throat, or lungs, such as bronchitis, pneumonia, bronchiectasis, asthma, or chronic obstructive pulmonary disease (COPD). ?Nosebleeds. ?Breathing in a foreign object. ?Common causes of severe (massive) hemoptysis include: ?Tuberculosis (TB). ?A tumor in the lungs or upper airway. ?A blood clot in the lungs (pulmonary embolism). ?Stomach bleeding or ulcer. ?Taking blood thinner (anticoagulant) medicine. ?Having a medical condition that keeps your blood from normal clotting. ?Sometimes, the cause is not known. ?Follow these instructions at home: ?Medicines ?If you were prescribed an antibiotic medicine, take it as told by your health care provider. Do not stop using the antibiotic even if you start to feel better. ?Take over-the-counter and prescription medicines only as told by your health care provider. ?General instructions ? ?Do not use any products that contain nicotine or tobacco. These products include cigarettes, chewing tobacco, and vaping devices, such as e-cigarettes. If you need help quitting, ask your health care provider. ?Return to your normal activities as told by your health care provider. Ask your health care provider what activities are safe for you. This includes any exercise or air travel. ?Keep all follow-up visits. This is important. ?Contact a health care provider if: ?You have a fever over 100.4?F (38?C). ?The amount of bleeding increases. ?The blood looks brighter or darker red. ?Get help right away if you: ?Cough up fresh blood or blood clots. ?Cough up 1-2 cups (240-480 mL) in 24 hours. ?Have trouble  breathing. ?Feel like you are choking. ?Have chest pain, light-headedness, or dizziness. ?These symptoms may be an emergency. Get help right away. Call 911. ?Do not wait to see if the symptoms will go away. ?Do not drive yourself to the hospital. ?Summary ?Hemoptysis is coughing up blood. ?Coughing up 1-2 cups (240-480 mL) of blood within 24 hours is a medical emergency. ?Do not use any products that contain nicotine or tobacco. These products include cigarettes, chewing tobacco, and vaping devices, such as e-cigarettes. If you need help quitting, ask your health care provider. ?This information is not intended to replace advice given to you by your health care provider. Make sure you discuss any questions you have with your health care provider. ?Document Revised: 09/21/2020 Document Reviewed: 09/21/2020 ?Elsevier Patient Education ? [place]. ? ?

## 2021-06-29 LAB — CBC WITH DIFFERENTIAL/PLATELET
Basophils Absolute: 0.1 10*3/uL (ref 0.0–0.2)
Basos: 1 %
EOS (ABSOLUTE): 0.3 10*3/uL (ref 0.0–0.4)
Eos: 2 %
Hematocrit: 29.7 % — ABNORMAL LOW (ref 37.5–51.0)
Hemoglobin: 10.4 g/dL — ABNORMAL LOW (ref 13.0–17.7)
Immature Grans (Abs): 0.4 10*3/uL — ABNORMAL HIGH (ref 0.0–0.1)
Immature Granulocytes: 3 %
Lymphocytes Absolute: 1.3 10*3/uL (ref 0.7–3.1)
Lymphs: 10 %
MCH: 32.7 pg (ref 26.6–33.0)
MCHC: 35 g/dL (ref 31.5–35.7)
MCV: 93 fL (ref 79–97)
Monocytes Absolute: 1.5 10*3/uL — ABNORMAL HIGH (ref 0.1–0.9)
Monocytes: 12 %
Neutrophils Absolute: 9.6 10*3/uL — ABNORMAL HIGH (ref 1.4–7.0)
Neutrophils: 72 %
Platelets: 220 10*3/uL (ref 150–450)
RBC: 3.18 x10E6/uL — ABNORMAL LOW (ref 4.14–5.80)
RDW: 14.1 % (ref 11.6–15.4)
WBC: 13.1 10*3/uL — ABNORMAL HIGH (ref 3.4–10.8)

## 2021-07-14 ENCOUNTER — Ambulatory Visit (INDEPENDENT_AMBULATORY_CARE_PROVIDER_SITE_OTHER): Payer: Medicare Other | Admitting: Family Medicine

## 2021-07-14 ENCOUNTER — Encounter: Payer: Self-pay | Admitting: Family Medicine

## 2021-07-14 VITALS — BP 114/70 | HR 82 | Ht 65.0 in | Wt 162.0 lb

## 2021-07-14 DIAGNOSIS — E039 Hypothyroidism, unspecified: Secondary | ICD-10-CM

## 2021-07-14 DIAGNOSIS — E785 Hyperlipidemia, unspecified: Secondary | ICD-10-CM | POA: Diagnosis not present

## 2021-07-14 DIAGNOSIS — J989 Respiratory disorder, unspecified: Secondary | ICD-10-CM

## 2021-07-14 DIAGNOSIS — Z23 Encounter for immunization: Secondary | ICD-10-CM

## 2021-07-14 DIAGNOSIS — J449 Chronic obstructive pulmonary disease, unspecified: Secondary | ICD-10-CM

## 2021-07-14 DIAGNOSIS — N401 Enlarged prostate with lower urinary tract symptoms: Secondary | ICD-10-CM

## 2021-07-14 DIAGNOSIS — J683 Other acute and subacute respiratory conditions due to chemicals, gases, fumes and vapors: Secondary | ICD-10-CM

## 2021-07-14 DIAGNOSIS — M7741 Metatarsalgia, right foot: Secondary | ICD-10-CM

## 2021-07-14 DIAGNOSIS — R3912 Poor urinary stream: Secondary | ICD-10-CM

## 2021-07-14 DIAGNOSIS — E221 Hyperprolactinemia: Secondary | ICD-10-CM

## 2021-07-14 MED ORDER — ALBUTEROL SULFATE HFA 108 (90 BASE) MCG/ACT IN AERS: 2.0000 | INHALATION_SPRAY | Freq: Four times a day (QID) | RESPIRATORY_TRACT | 5 refills | Status: DC | PRN

## 2021-07-14 MED ORDER — NITROGLYCERIN 0.4 MG SL SUBL: 0.4000 mg | SUBLINGUAL_TABLET | SUBLINGUAL | 2 refills | Status: AC | PRN

## 2021-07-14 MED ORDER — TAMSULOSIN HCL 0.4 MG PO CAPS: 0.4000 mg | ORAL_CAPSULE | Freq: Every day | ORAL | 3 refills | Status: AC

## 2021-07-14 MED ORDER — DICLOFENAC SODIUM 1 % EX GEL: 2.0000 g | Freq: Four times a day (QID) | CUTANEOUS | 5 refills | Status: AC

## 2021-07-14 NOTE — Progress Notes (Signed)
? ?BP 114/70   Pulse 82   Ht '5\' 5"'$  (1.651 m)   Wt 162 lb (73.5 kg)   SpO2 95%   BMI 26.96 kg/m?   ? ?Subjective:  ? ?Patient ID: [name], male    DOB: [birth year], 88 y.o.   MRN: [id number] ? ?HPI: ?[name] is a 88 y.o. male presenting on 07/14/2021 for Hyperlipidemia, Medical Management of Chronic Issues, Hypothyroidism, chest congestion (Productive cough), Shoulder Pain, and Knee Pain ? ? ?HPI ?Hyperlipidemia ?Patient is coming in for recheck of his hyperlipidemia. The patient is currently taking aspirin, has been intolerant to statins and Zetia. They deny any issues with myalgias or history of liver damage from it. They deny any focal numbness or weakness or chest pain. ? ?Hypothyroidism recheck ?Patient is coming in for thyroid recheck today as well. They deny any issues with hair changes or heat or cold problems or diarrhea or constipation. They deny any chest pain or palpitations. They are currently on levothyroxine 75 micrograms  ? ?Patient has congestion but he says its back to his baseline, he was treated a couple weeks ago with an antibiotic and he says he is back to his normal amount of congestion. ? ?Patient has arthritis in shoulder and hip and sometimes in his hands and comes and goes and at times is worse.  He says he is doing okay with that right now but it does flareup at times. ? ?Relevant past medical, surgical, family and social history reviewed and updated as indicated. Interim medical history since our last visit reviewed. ?Allergies and medications reviewed and updated. ? ?Review of Systems  ?Constitutional:  Negative for chills and fever.  ?HENT:  Positive for congestion.   ?Eyes:  Negative for visual disturbance.  ?Respiratory:  Positive for cough and wheezing. Negative for chest tightness and shortness of breath.   ?Cardiovascular:  Negative for chest pain and leg swelling.  ?Musculoskeletal:  Positive for arthralgias and myalgias.  ?Skin:  Negative for rash.   ?Neurological:  Negative for dizziness, weakness and light-headedness.  ?Psychiatric/Behavioral:  Negative for dysphoric mood, self-injury, sleep disturbance and suicidal ideas. The patient is not nervous/anxious.   ?All other systems reviewed and are negative. ? ?Per HPI unless specifically indicated above ? ? ?Allergies as of 07/14/2021   ? ?   Reactions  ? Gadavist [gadobutrol] Hives  ? Prednisone   ? Agitation and hallucinations  ? Betadine [povidone Iodine] Other (See Comments)  ? Blisters   ? Lortab [hydrocodone-acetaminophen] Nausea And Vomiting  ? Penicillins Other (See Comments)  ? Lightheadedness  ?Has patient had a PCN reaction causing immediate rash, facial/tongue/throat swelling, SOB or lightheadedness with hypotension: Yes ?Has patient had a PCN reaction causing severe rash involving mucus membranes or skin necrosis: No ?Has patient had a PCN reaction that required hospitalization No ?Has patient had a PCN reaction occurring within the last 10 years: No ?If all of the above answers are "NO", then may proceed with Cephalosporin use.  ? Zetia [ezetimibe] Other (See Comments)  ? Muscle weakness   ? Zocor [simvastatin] Nausea Only, Other (See Comments)  ? Muscle weakness   ? ?  ? ?  ?Medication List  ?  ? ?  ? Accurate as of Jul 14, 2021 11:57 AM. If you have any questions, ask your nurse or doctor.  ?  ?  ? ?  ? ?albuterol 108 (90 Base) MCG/ACT inhaler ?Commonly known as: VENTOLIN HFA ?Inhale 2 puffs into the lungs  every 6 (six) hours as needed for wheezing or shortness of breath. ?  ?aspirin EC 81 MG tablet ?Take 1 tablet (81 mg total) by mouth daily. Swallow whole. ?  ?bisacodyl 10 MG suppository ?Commonly known as: DULCOLAX ?Place 10 mg rectally as needed for moderate constipation. ?  ?bisacodyl 10 MG/30ML Enem ?Commonly known as: FLEET ?Place 10 mg rectally daily as needed (severe constipation). ?  ?Breztri Aerosphere 160-9-4.8 MCG/ACT Aero ?Generic drug: Budeson-Glycopyrrol-Formoterol ?Inhale 2 puffs  into the lungs 2 (two) times daily. ?  ?bromocriptine 2.5 MG tablet ?Commonly known as: PARLODEL ?Take 0.5 tablets (1.25 mg total) by mouth 2 (two) times daily. ?  ?budesonide-formoterol 160-4.5 MCG/ACT inhaler ?Commonly known as: Symbicort ?Inhale 2 puffs into the lungs in the morning and at bedtime. ?  ?cholecalciferol 1000 units tablet ?Commonly known as: VITAMIN D ?Take 1,000 Units by mouth daily. ?  ?diclofenac Sodium 1 % Gel ?Commonly known as: VOLTAREN ?Apply 2 g topically 4 (four) times daily. ?What changed:  ?when to take this ?reasons to take this ?  ?diphenhydramine-acetaminophen 25-500 MG Tabs tablet ?Commonly known as: TYLENOL PM ?Take 1 tablet by mouth at bedtime as needed. ?  ?FeroSul 325 (65 FE) MG tablet ?Generic drug: ferrous sulfate ?TAKE (1) TABLET DAILY WITH BREAKFAST. ?  ?Flutter Devi ?Twice a day and prn as needed, may increase if feeling worse ?  ?GaviLAX 17 GM/SCOOP powder ?Generic drug: polyethylene glycol powder ?DISSOLVE 17 GM SCOOP IN 8 OZ. OF WATER AND DRINK ONCE DAILY AS NEEDED FOR CONSTIPATION ?What changed: See the new instructions. ?  ?guaiFENesin 600 MG 12 hr tablet ?Commonly known as: Mucinex ?Take 1 tablet (600 mg total) by mouth 2 (two) times daily. ?  ?levothyroxine 75 MCG tablet ?Commonly known as: SYNTHROID ?Take 1 tablet daily. ?  ?magnesium oxide 400 (241.3 Mg) MG tablet ?Commonly known as: MAG-OX ?Take 1 tablet (400 mg total) by mouth 2 (two) times daily. ?What changed:  ?when to take this ?reasons to take this ?  ?Metamucil 28 % packet ?Generic drug: psyllium ?Take 1 packet by mouth daily. ?  ?naphazoline-pheniramine 0.025-0.3 % ophthalmic solution ?Commonly known as: NAPHCON-A ?Place 1 drop into the left eye 2 (two) times daily as needed for irritation or allergies. ?  ?nitroGLYCERIN 0.4 MG SL tablet ?Commonly known as: NITROSTAT ?Place 1 tablet (0.4 mg total) under the tongue every 5 (five) minutes as needed for chest pain. ?  ?pantoprazole 40 MG tablet ?Commonly known  as: PROTONIX ?TAKE ONE TABLET TWICE DAILY ?  ?sodium chloride HYPERTONIC 3 % nebulizer solution ?Take by nebulization as needed for other. ?What changed:  ?how much to take ?when to take this ?  ?sucralfate 1 g tablet ?Commonly known as: CARAFATE ?Take 1 tablet three times daily as needed ?  ?tamsulosin 0.4 MG Caps capsule ?Commonly known as: FLOMAX ?Take 1 capsule (0.4 mg total) by mouth daily after supper. ?  ?triamcinolone cream 0.1 % ?Commonly known as: KENALOG ?Apply 1 application topically 2 (two) times daily. ?What changed:  ?when to take this ?reasons to take this ?  ?vitamin B-12 1000 MCG tablet ?Commonly known as: CYANOCOBALAMIN ?Take 1,000 mcg by mouth daily. ?  ?Vitamin C 500 MG Caps ?Take 1 capsule by mouth daily. ?  ?vitamin E 180 MG (400 UNITS) capsule ?Take 400 Units by mouth daily. ?  ? ?  ? ? ? ?Objective:  ? ?BP 114/70   Pulse 82   Ht '5\' 5"'$  (1.651 m)   Wt 162  lb (73.5 kg)   SpO2 95%   BMI 26.96 kg/m?   ?Wt Readings from Last 3 Encounters:  ?07/14/21 162 lb (73.5 kg)  ?06/28/21 163 lb 2 oz (74 kg)  ?06/23/21 161 lb (73 kg)  ?  ?Physical Exam ?Vitals and nursing note reviewed.  ?Constitutional:   ?   General: He is not in acute distress. ?   Appearance: He is well-developed. He is not diaphoretic.  ?Eyes:  ?   General: No scleral icterus. ?   Conjunctiva/sclera: Conjunctivae normal.  ?Neck:  ?   Thyroid: No thyromegaly.  ?Cardiovascular:  ?   Rate and Rhythm: Normal rate and regular rhythm.  ?   Heart sounds: Normal heart sounds. No murmur heard. ?Pulmonary:  ?   Effort: Pulmonary effort is normal. No respiratory distress.  ?   Breath sounds: Wheezing and rhonchi present.  ?Musculoskeletal:     ?   General: Normal range of motion.  ?   Cervical back: Neck supple.  ?Lymphadenopathy:  ?   Cervical: No cervical adenopathy.  ?Skin: ?   General: Skin is warm and dry.  ?   Findings: No rash.  ?Neurological:  ?   Mental Status: He is alert and oriented to person, place, and time.  ?   Coordination:  Coordination normal.  ?Psychiatric:     ?   Behavior: Behavior normal.  ? ? ? ? ?Assessment & Plan:  ? ?Problem List Items Addressed This Visit   ? ?  ? Respiratory  ? COPD (chronic obstructive pulmonary disease) ([place])  ? Re

## 2021-07-14 NOTE — Addendum Note (Signed)
Addended by: Alphonzo Dublin on: 07/14/2021 12:10 PM ? ? Modules accepted: Orders ? ?

## 2021-07-15 LAB — CBC WITH DIFFERENTIAL/PLATELET
Basophils Absolute: 0.2 10*3/uL (ref 0.0–0.2)
Basos: 2 %
EOS (ABSOLUTE): 0.3 10*3/uL (ref 0.0–0.4)
Eos: 3 %
Hematocrit: 34.7 % — ABNORMAL LOW (ref 37.5–51.0)
Hemoglobin: 11 g/dL — ABNORMAL LOW (ref 13.0–17.7)
Immature Grans (Abs): 0.4 10*3/uL — ABNORMAL HIGH (ref 0.0–0.1)
Immature Granulocytes: 4 %
Lymphocytes Absolute: 1.2 10*3/uL (ref 0.7–3.1)
Lymphs: 12 %
MCH: 30.8 pg (ref 26.6–33.0)
MCHC: 31.7 g/dL (ref 31.5–35.7)
MCV: 97 fL (ref 79–97)
Monocytes Absolute: 1.2 10*3/uL — ABNORMAL HIGH (ref 0.1–0.9)
Monocytes: 12 %
Neutrophils Absolute: 6.7 10*3/uL (ref 1.4–7.0)
Neutrophils: 67 %
Platelets: 316 10*3/uL (ref 150–450)
RBC: 3.57 x10E6/uL — ABNORMAL LOW (ref 4.14–5.80)
RDW: 14 % (ref 11.6–15.4)
WBC: 10 10*3/uL (ref 3.4–10.8)

## 2021-07-15 LAB — CMP14+EGFR
ALT: 11 IU/L (ref 0–44)
AST: 20 IU/L (ref 0–40)
Albumin/Globulin Ratio: 1.5 (ref 1.2–2.2)
Albumin: 3.7 g/dL (ref 3.6–4.6)
Alkaline Phosphatase: 76 IU/L (ref 44–121)
BUN/Creatinine Ratio: 15 (ref 10–24)
BUN: 15 mg/dL (ref 8–27)
Bilirubin Total: 0.4 mg/dL (ref 0.0–1.2)
CO2: 20 mmol/L (ref 20–29)
Calcium: 8.9 mg/dL (ref 8.6–10.2)
Chloride: 105 mmol/L (ref 96–106)
Creatinine, Ser: 0.97 mg/dL (ref 0.76–1.27)
Globulin, Total: 2.5 g/dL (ref 1.5–4.5)
Glucose: 83 mg/dL (ref 70–99)
Potassium: 4.5 mmol/L (ref 3.5–5.2)
Sodium: 140 mmol/L (ref 134–144)
Total Protein: 6.2 g/dL (ref 6.0–8.5)
eGFR: 76 mL/min/{1.73_m2} (ref >59)

## 2021-07-15 LAB — LIPID PANEL
Chol/HDL Ratio: 3.1 ratio (ref 0.0–5.0)
Cholesterol, Total: 173 mg/dL (ref 100–199)
HDL: 55 mg/dL (ref >39)
LDL Chol Calc (NIH): 105 mg/dL — ABNORMAL HIGH (ref 0–99)
Triglycerides: 69 mg/dL (ref 0–149)
VLDL Cholesterol Cal: 13 mg/dL (ref 5–40)

## 2021-07-15 LAB — TSH: TSH: 1.26 u[IU]/mL (ref 0.450–4.500)

## 2021-07-16 LAB — PROLACTIN: Prolactin: 146 ng/mL — ABNORMAL HIGH (ref 4.0–15.2)

## 2021-07-16 LAB — SPECIMEN STATUS REPORT

## 2021-08-03 ENCOUNTER — Other Ambulatory Visit (HOSPITAL_COMMUNITY): Payer: Self-pay

## 2021-08-03 ENCOUNTER — Telehealth: Payer: Self-pay | Admitting: Pharmacy Technician

## 2021-08-03 NOTE — Telephone Encounter (Signed)
Patient Advocate Encounter  Received notification from [place] that prior authorization for SYMBICORT 160[employer] [facility]) is required.   PA NOT submitted on 6.6.23 DUE TO INSURANCE COVERING BRAND. HAS BEEN FILLED AT [facility] [id number]  Status is pending   [facility] will continue to follow  Luciano Cutter, CPhT Patient Advocate Phone: [phone]

## 2021-08-05 ENCOUNTER — Other Ambulatory Visit: Payer: Self-pay | Admitting: Pulmonary Disease

## 2021-08-05 ENCOUNTER — Other Ambulatory Visit: Payer: Self-pay | Admitting: Family Medicine

## 2021-08-05 DIAGNOSIS — E221 Hyperprolactinemia: Secondary | ICD-10-CM

## 2021-08-10 ENCOUNTER — Telehealth: Payer: Self-pay | Admitting: Family Medicine

## 2021-08-10 DIAGNOSIS — E221 Hyperprolactinemia: Secondary | ICD-10-CM

## 2021-08-10 NOTE — Telephone Encounter (Signed)
  Prescription Request  08/10/2021  Is this a "Controlled Substance" medicine? yes  Have you seen your PCP in the last 2 weeks? yes  If YES, route message to pool  -  If NO, patient needs to be scheduled for appointment.  What is the name of the medication or equipment? Bromocriptine Mesylate 2.5 mg   Have you contacted your pharmacy to request a refill? yes   Which pharmacy would you like this sent to? [facility].   Patient notified that their request is being sent to the clinical staff for review and that they should receive a response within 2 business days.

## 2021-08-10 NOTE — Telephone Encounter (Signed)
I do not know that we can prescribe this medication, this is 1 that is very specialized and I do not know the ins and outs of it, if he does need [place] to bridge him we could try in the future but I would probably want him to come in and be seen so we could assess and I can research the medicine a little bit better.

## 2021-08-10 NOTE — Telephone Encounter (Signed)
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 DR. Merril Abbe DOES MEDICATION BUT IS NO LONGER THERE. PLEASE CALL PATIENT TO ADVISE IF CALLED IN.   Last OV 07/14/21 Last Rf by Dr. Loanne Drilling 06/2020 Next OV NOT sched Please advise

## 2021-08-13 ENCOUNTER — Other Ambulatory Visit: Payer: Self-pay

## 2021-08-13 DIAGNOSIS — E221 Hyperprolactinemia: Secondary | ICD-10-CM

## 2021-08-13 MED ORDER — BROMOCRIPTINE MESYLATE 2.5 MG PO TABS: 1.2500 mg | ORAL_TABLET | Freq: Two times a day (BID) | ORAL | 3 refills | Status: DC

## 2021-08-13 NOTE — Telephone Encounter (Addendum)
Pt states that he called Dr. Cordelia Pen office and was unable to speak to anyone and left a message with his concerns. Pt states that the recording said that Loanne Drilling was no longer there at the practice?  Pt will try to call them again today and try to speak to someone.  In the meantime, pt would like Dr. Merita Norton recommendations on another endocrinologist. Preferable one closer to home. Such as [place].  Pt aware that Dr. Warrick Parisian will return on Monday and will be able to address at that time.Pt understood.

## 2021-08-13 NOTE — Telephone Encounter (Signed)
Pt calling about this message. Please call back

## 2021-08-16 NOTE — Telephone Encounter (Signed)
The only endocrinologist in the county is Dr. Dorien Chihuahua and if he would like to be referred to him and then I am more than happy to have this referral placed by [place], if not another 1 that I like to use a lot is Dr. Buddy Duty in [place]

## 2021-08-16 NOTE — Telephone Encounter (Signed)
Left message informing pt of Dr. Merita Norton recommendations and to call back to let us know which endocrinologist he would prefer.

## 2021-08-17 ENCOUNTER — Telehealth: Payer: Self-pay | Admitting: Family Medicine

## 2021-08-17 DIAGNOSIS — E119 Type 2 diabetes mellitus without complications: Secondary | ICD-10-CM

## 2021-08-18 NOTE — Telephone Encounter (Signed)
Patient called again today to let us know he would like to see Dr. Dorris Fetch.  A referral was ordered and patient is aware.

## 2021-08-18 NOTE — Telephone Encounter (Signed)
Okay go ahead and place referral to Dr. Micheline Chapman office for endocrinology for diabetes management

## 2021-08-18 NOTE — Telephone Encounter (Signed)
Referral ordered, patient aware 

## 2021-08-24 ENCOUNTER — Ambulatory Visit (INDEPENDENT_AMBULATORY_CARE_PROVIDER_SITE_OTHER): Payer: Medicare Other | Admitting: Family Medicine

## 2021-08-24 ENCOUNTER — Encounter: Payer: Self-pay | Admitting: Family Medicine

## 2021-08-24 VITALS — BP 139/78 | HR 73 | Temp 97.4°F | Ht 65.0 in | Wt 163.4 lb

## 2021-08-24 DIAGNOSIS — G8929 Other chronic pain: Secondary | ICD-10-CM

## 2021-08-24 DIAGNOSIS — M25512 Pain in left shoulder: Secondary | ICD-10-CM

## 2021-08-24 DIAGNOSIS — D485 Neoplasm of uncertain behavior of skin: Secondary | ICD-10-CM | POA: Diagnosis not present

## 2021-08-27 ENCOUNTER — Ambulatory Visit (INDEPENDENT_AMBULATORY_CARE_PROVIDER_SITE_OTHER): Payer: Medicare Other | Admitting: Family Medicine

## 2021-08-27 ENCOUNTER — Encounter: Payer: Self-pay | Admitting: Family Medicine

## 2021-08-27 VITALS — BP 136/68 | HR 68 | Temp 98.0°F | Ht 65.0 in | Wt 163.0 lb

## 2021-08-27 DIAGNOSIS — M25551 Pain in right hip: Secondary | ICD-10-CM | POA: Diagnosis not present

## 2021-08-27 MED ORDER — METHYLPREDNISOLONE ACETATE 80 MG/ML IJ SUSP
80.0000 mg | Freq: Once | INTRAMUSCULAR | Status: AC
  Administered 2021-08-27: 80 mg via INTRAMUSCULAR

## 2021-08-27 NOTE — Patient Instructions (Signed)
Hip Bursitis Rehab Ask your health care provider which exercises are safe for you. Do exercises exactly as told by your health care provider and adjust them as directed. It is normal to feel mild stretching, pulling, tightness, or discomfort as you do these exercises. Stop right away if you feel sudden pain or your pain gets worse. Do not begin these exercises until told by your health care provider. Stretching exercise This exercise warms up your muscles and joints and improves the movement and flexibility of your hip. This exercise also helps to relieve pain and stiffness. Iliotibial band stretch An iliotibial band is a strong band of muscle tissue that runs from the outer side of your hip to the outer side of your thigh and knee. Lie on your side with your left / right leg in the top position. Bend your left / right knee and grab your ankle. Stretch out your bottom arm to help you balance. Slowly bring your knee back so your thigh is slightly behind your body. Slowly lower your knee toward the floor until you feel a gentle stretch on the outside of your left / right thigh. If you do not feel a stretch and your knee will not lower more toward the floor, place the heel of your other foot on top of your knee and pull your knee down toward the floor with your foot. Hold this position for __________ seconds. Slowly return to the starting position. Repeat __________ times. Complete this exercise __________ times a day. Strengthening exercises These exercises build strength and endurance in your hip and pelvis. Endurance is the ability to use your muscles for a long time, even after they get tired. Bridge This exercise strengthens the muscles that move your thigh backward (hip extensors). Lie on your back on a firm surface with your knees bent and your feet flat on the floor. Tighten your buttocks muscles and lift your buttocks off the floor until your trunk is level with your thighs. Do not arch your  back. You should feel the muscles working in your buttocks and the back of your thighs. If you do not feel these muscles, slide your feet 1-2 inches (2.5-5 cm) farther away from your buttocks. If this exercise is too easy, try doing it with your arms crossed over your chest. Hold this position for __________ seconds. Slowly lower your hips to the starting position. Let your muscles relax completely after each repetition. Repeat __________ times. Complete this exercise __________ times a day. Squats This exercise strengthens the muscles in front of your thigh and knee (quadriceps). Stand in front of a table, with your feet and knees pointing straight ahead. You may rest your hands on the table for balance but not for support. Slowly bend your knees and lower your hips like you are going to sit in a chair. Keep your weight over your heels, not over your toes. Keep your lower legs upright so they are parallel with the table legs. Do not let your hips go lower than your knees. Do not bend lower than told by your health care provider. If your hip pain increases, do not bend as low. Hold the squat position for __________ seconds. Slowly push with your legs to return to standing. Do not use your hands to pull yourself to standing. Repeat __________ times. Complete this exercise __________ times a day. Hip hike  Stand sideways on a bottom step. Stand on your left / right leg with your other foot unsupported next to   the step. You can hold on to the railing or wall for balance if needed. Keep your knees straight and your torso square. Then lift your left / right hip up toward the ceiling. Hold this position for __________ seconds. Slowly let your left / right hip lower toward the floor, past the starting position. Your foot should get closer to the floor. Do not lean or bend your knees. Repeat __________ times. Complete this exercise __________ times a day. Single leg stand This exercise increases  your balance. Without shoes, stand near a railing or in a doorway. You may hold on to the railing or door frame as needed for balance. Squeeze your left / right buttock muscles, then lift up your other foot. Do not let your left / right hip push out to the side. It is helpful to stand in front of a mirror for this exercise so you can watch your hip. Hold this position for __________ seconds. Repeat __________ times. Complete this exercise __________ times a day. This information is not intended to replace advice given to you by your health care provider. Make sure you discuss any questions you have with your health care provider. Document Revised: 01/27/2021 Document Reviewed: 01/27/2021 Elsevier Patient Education  2023 Elsevier Inc.  

## 2021-08-27 NOTE — Progress Notes (Signed)
BP 136/68   Pulse 68   Temp 98 F (36.7 C)   Ht '5\' 5"'$  (1.651 m)   Wt 163 lb (73.9 kg)   SpO2 97%   BMI 27.12 kg/m    Subjective:   Patient ID: [name], male    DOB: 02/15/1933, 88 y.o.   MRN: [id number]  HPI: [name] is a 88 y.o. male presenting on 08/27/2021 for Hip Pain (right)   HPI Right hip pain Patient comes in complaining of right hip pain that hurts on the lateral aspect of his right hip and it does hurt more when he is walking around.  He says he does squats and modified push-ups on a regular basis and he feels like he irritated it.  Denies any falls recently because of fracture or trauma.  He says he can walk around the just hurts more especially on the lateral aspect.  He says it was getting better using the Voltaren gel and then all of a sudden yesterday and the day before it started hurting a lot more over the past 24 hours.  He denies any numbness or weakness.  Relevant past medical, surgical, family and social history reviewed and updated as indicated. Interim medical history since our last visit reviewed. Allergies and medications reviewed and updated.  Review of Systems  Constitutional:  Negative for chills and fever.  Respiratory:  Negative for shortness of breath and wheezing.   Cardiovascular:  Negative for chest pain and leg swelling.  Musculoskeletal:  Positive for arthralgias. Negative for back pain, gait problem and myalgias.  Skin:  Negative for rash.  All other systems reviewed and are negative.   Per HPI unless specifically indicated above   Allergies as of 08/27/2021       Reactions   Gadavist [gadobutrol] Hives   Prednisone    Agitation and hallucinations   Betadine [povidone Iodine] Other (See Comments)   Blisters    Lortab [hydrocodone-acetaminophen] Nausea And Vomiting   Penicillins Other (See Comments)   Lightheadedness  Has patient had a PCN reaction causing immediate rash, facial/tongue/throat swelling, SOB or  lightheadedness with hypotension: Yes Has patient had a PCN reaction causing severe rash involving mucus membranes or skin necrosis: No Has patient had a PCN reaction that required hospitalization No Has patient had a PCN reaction occurring within the last 10 years: No If all of the above answers are "NO", then may proceed with Cephalosporin use.   Zetia [ezetimibe] Other (See Comments)   Muscle weakness    Zocor [simvastatin] Nausea Only, Other (See Comments)   Muscle weakness         Medication List        Accurate as of August 27, 2021  3:17 PM. If you have any questions, ask your nurse or doctor.          albuterol 108 (90 Base) MCG/ACT inhaler Commonly known as: VENTOLIN HFA Inhale 2 puffs into the lungs every 6 (six) hours as needed for wheezing or shortness of breath.   aspirin EC 81 MG tablet Take 1 tablet (81 mg total) by mouth daily. Swallow whole.   bisacodyl 10 MG suppository Commonly known as: DULCOLAX Place 10 mg rectally as needed for moderate constipation.   bisacodyl 10 MG/30ML Enem Commonly known as: FLEET Place 10 mg rectally daily as needed (severe constipation).   Breztri Aerosphere 160-9-4.8 MCG/ACT Aero Generic drug: Budeson-Glycopyrrol-Formoterol Inhale 2 puffs into the lungs 2 (two) times daily.   bromocriptine 2.5  MG tablet Commonly known as: PARLODEL Take 0.5 tablets (1.25 mg total) by mouth 2 (two) times daily.   budesonide-formoterol 160-4.5 MCG/ACT inhaler Commonly known as: Symbicort Inhale 2 puffs into the lungs in the morning and at bedtime.   cholecalciferol 1000 units tablet Commonly known as: VITAMIN D Take 1,000 Units by mouth daily.   diclofenac Sodium 1 % Gel Commonly known as: VOLTAREN Apply 2 g topically 4 (four) times daily.   diphenhydramine-acetaminophen 25-500 MG Tabs tablet Commonly known as: TYLENOL PM Take 1 tablet by mouth at bedtime as needed.   FeroSul 325 (65 FE) MG tablet Generic drug: ferrous  sulfate TAKE (1) TABLET DAILY WITH BREAKFAST.   Flutter Devi Twice a day and prn as needed, may increase if feeling worse   GaviLAX 17 GM/SCOOP powder Generic drug: polyethylene glycol powder DISSOLVE 17 GM SCOOP IN 8 OZ. OF WATER AND DRINK ONCE DAILY AS NEEDED FOR CONSTIPATION What changed: See the new instructions.   levothyroxine 75 MCG tablet Commonly known as: SYNTHROID Take 1 tablet daily.   magnesium oxide 400 (241.3 Mg) MG tablet Commonly known as: MAG-OX Take 1 tablet (400 mg total) by mouth 2 (two) times daily. What changed:  when to take this reasons to take this   Metamucil 28 % packet Generic drug: psyllium Take 1 packet by mouth daily.   naphazoline-pheniramine 0.025-0.3 % ophthalmic solution Commonly known as: NAPHCON-A Place 1 drop into the left eye 2 (two) times daily as needed for irritation or allergies.   nitroGLYCERIN 0.4 MG SL tablet Commonly known as: NITROSTAT Place 1 tablet (0.4 mg total) under the tongue every 5 (five) minutes as needed for chest pain.   pantoprazole 40 MG tablet Commonly known as: PROTONIX TAKE ONE TABLET TWICE DAILY   sodium chloride HYPERTONIC 3 % nebulizer solution Take by nebulization as needed for other. What changed:  how much to take when to take this   sucralfate 1 g tablet Commonly known as: CARAFATE Take 1 tablet three times daily as needed   tamsulosin 0.4 MG Caps capsule Commonly known as: FLOMAX Take 1 capsule (0.4 mg total) by mouth daily after supper.   triamcinolone cream 0.1 % Commonly known as: KENALOG Apply 1 application topically 2 (two) times daily. What changed:  when to take this reasons to take this   vitamin B-12 1000 MCG tablet Commonly known as: CYANOCOBALAMIN Take 1,000 mcg by mouth daily.   Vitamin C 500 MG Caps Take 1 capsule by mouth daily.   vitamin E 180 MG (400 UNITS) capsule Take 400 Units by mouth daily.         Objective:   BP 136/68   Pulse 68   Temp 98 F  (36.7 C)   Ht '5\' 5"'$  (1.651 m)   Wt 163 lb (73.9 kg)   SpO2 97%   BMI 27.12 kg/m   Wt Readings from Last 3 Encounters:  08/27/21 163 lb (73.9 kg)  08/24/21 163 lb 6.4 oz (74.1 kg)  07/14/21 162 lb (73.5 kg)    Physical Exam Vitals and nursing note reviewed.  Constitutional:      Appearance: Normal appearance.  Musculoskeletal:     Right hip: Deformity, tenderness and crepitus present. Decreased range of motion.  Neurological:     Mental Status: He is alert.     Hip x-ray: Await final read from radiology  Assessment & Plan:   Problem List Items Addressed This Visit   None Visit Diagnoses  Right hip pain    -  Primary   Relevant Medications   methylPREDNISolone acetate (DEPO-MEDROL) injection 80 mg   Other Relevant Orders   DG HIP UNILAT W OR W/O PELVIS 2-3 VIEWS RIGHT     We will do steroid dose and do a hip x-ray on the way out but do not think it is fractured.  Should calm it down and continue using Voltaren gel as well.  Follow up plan: Return if symptoms worsen or fail to improve.  Counseling provided for all of the vaccine components Orders Placed This Encounter  Procedures   DG HIP UNILAT W OR W/O PELVIS 2-3 [place] Neetu Carrozza, MD [facility] 08/27/2021, 3:17 PM

## 2021-08-30 ENCOUNTER — Encounter: Payer: Self-pay | Admitting: Family Medicine

## 2021-08-30 ENCOUNTER — Other Ambulatory Visit (INDEPENDENT_AMBULATORY_CARE_PROVIDER_SITE_OTHER): Payer: Medicare Other

## 2021-08-30 DIAGNOSIS — M25551 Pain in right hip: Secondary | ICD-10-CM | POA: Diagnosis not present

## 2021-09-01 ENCOUNTER — Encounter: Payer: Self-pay | Admitting: Orthopaedic Surgery

## 2021-09-01 ENCOUNTER — Ambulatory Visit (INDEPENDENT_AMBULATORY_CARE_PROVIDER_SITE_OTHER): Payer: Medicare Other

## 2021-09-01 ENCOUNTER — Ambulatory Visit: Payer: Medicare Other | Admitting: Orthopaedic Surgery

## 2021-09-01 VITALS — Ht 65.0 in | Wt 155.0 lb

## 2021-09-01 DIAGNOSIS — M25512 Pain in left shoulder: Secondary | ICD-10-CM | POA: Diagnosis not present

## 2021-09-01 DIAGNOSIS — G8929 Other chronic pain: Secondary | ICD-10-CM | POA: Diagnosis not present

## 2021-09-01 DIAGNOSIS — M25551 Pain in right hip: Secondary | ICD-10-CM | POA: Diagnosis not present

## 2021-09-01 MED ORDER — LIDOCAINE HCL 1 % IJ SOLN
3.0000 mL | INTRAMUSCULAR | Status: AC | PRN
  Administered 2021-09-01: 3 mL

## 2021-09-01 MED ORDER — METHYLPREDNISOLONE ACETATE 40 MG/ML IJ SUSP
40.0000 mg | INTRAMUSCULAR | Status: AC | PRN
  Administered 2021-09-01: 40 mg via INTRA_ARTICULAR

## 2021-09-01 MED ORDER — BUPIVACAINE HCL 0.25 % IJ SOLN
2.0000 mL | INTRAMUSCULAR | Status: AC | PRN
  Administered 2021-09-01: 2 mL via INTRA_ARTICULAR

## 2021-09-01 MED ORDER — TRAMADOL HCL 50 MG PO TABS: 50.0000 mg | ORAL_TABLET | Freq: Two times a day (BID) | ORAL | 2 refills | Status: AC | PRN

## 2021-09-01 NOTE — Progress Notes (Signed)
Office Visit Note   Patient: [name]           Date of Birth: May 17, 1932           MRN: [id number] Visit Date: 09/01/2021              Requested by: Claretta Fraise, MD [place],  [place] [address] PCP: Dettinger, [name], MD   Assessment & Plan: Visit Diagnoses:  1. Chronic left shoulder pain   2. Pain in right hip     Plan: Impression is right hip trochanteric bursitis and left shoulder impingement syndrome.  In regards to the right hip, I discussed trochanteric bursa injection today for which he is agreeable to.  In regards to the left shoulder, I discussed subacromial cortisone injection but he notes that his symptoms really are not bad enough at this point to proceed with injection.  He will follow-up with [place] if his symptoms worsen.  Follow-Up Instructions: Return if symptoms worsen or fail to improve.   Orders:  Orders Placed This Encounter  Procedures   XR Shoulder Left   No orders of the defined types were placed in this encounter.     Procedures: Large Joint Inj: R greater trochanter on 09/01/2021 1:33 PM Indications: pain Details: 22 G needle, lateral approach Medications: 3 mL lidocaine 1 %; 2 mL bupivacaine 0.25 %; 40 mg methylPREDNISolone acetate 40 MG/ML      Clinical Data: No additional findings.   Subjective: Chief Complaint  Patient presents with   Left Shoulder - Pain   Right Hip - Pain    HPI patient is a pleasant 88 year old gentleman who comes in today with right hip and left shoulder pain.  Regards to his right hip, he has had pain to the lateral aspect for the past several months.  He notes very occasional pain that radiates from the anterior thigh to the buttock.  Pain appears to be worse with walking as well as lifting his leg or sleeping on the right side.  He has been using topical creams without significant relief.  Of note, he is status post bilateral total hip replacements about 20 years ago.  In regards to the left  shoulder, he has had pain for years.  No known injury or change in activity.  The pain is primarily to the top of the shoulder and is worse with cross body adduction.  He denies any weakness.  No paresthesias.  He has had what sounds like subacromial cortisone injections in the past with good relief.  He does note he is currently in minimal pain to the left shoulder.  Review of Systems as detailed in HPI.  All others reviewed and are negative.   Objective: Vital Signs: Ht '5\' 5"'$  (1.651 m)   Wt 155 lb (70.3 kg)   BMI 25.79 kg/m   Physical Exam well-developed well-nourished gentleman in no acute distress.  Alert and oriented x3.  Ortho Exam right hip exam reveals a painless logroll.  He does have moderate tenderness to the greater trochanter.  Negative straight leg raise.  He is neurovascular intact distally.  Left shoulder exam reveals full active range of motion all planes.  Internal rotation to P38 which does elicit slight pain.  Negative empty can.  Positive cross body adduction.  Minimal tenderness to the [facility] joint.  He is neurovascular intact distally.  Specialty Comments:  No specialty comments available.  Imaging: No results found.   PMFS History: Patient Active  Problem List   Diagnosis Date Noted   Barrett's esophagus 06/09/2020   Acquired dilation of ascending aorta and aortic root ([place]) 05/20/2020   Memory disorder 05/03/2019   Chronic constipation 10/17/2018   Bronchiectasis with acute exacerbation ([place]) 11/27/2017   Peripheral vascular insufficiency ([place]) 07/19/2017   Aneurysmal dilatation ([place]) 07/19/2017   Aortic atherosclerosis ([place]) 11/21/2016   Thoracic ascending aortic aneurysm ([place]) 08/10/2016   CAD (coronary artery disease) 08/10/2016   Hiatal hernia 05/04/2016   Afib ([place]) 06/12/2015   Neuropathy 12/31/2013   Vitamin B 12 deficiency 12/31/2013   Personal history of colonic polyps 01/03/2011   COPD (chronic obstructive pulmonary disease) ([place]) 08/24/2010    COLONIC POLYPS 07/17/2008   Macrocytic anemia 07/17/2008   Gastroesophageal reflux disease with esophagitis 07/17/2008   DEGENERATIVE JOINT DISEASE 07/17/2008   Osteoporosis 07/17/2008   BPH (benign prostatic hyperplasia) 07/17/2008   Hyperprolactinemia ([place]) 10/24/2007   PITUITARY ADENOMA 06/25/2007   Hypothyroidism 06/25/2007   Hyperlipidemia LDL goal <70 06/22/2007   Past Medical History:  Diagnosis Date   Abnormality of gait 06/07/2013   Anemia    Aneurysm ([place])    on assending aorta, currently watching it, Dr Cyndia Bent   Anxiety    Arthritis    Asthma    Atrial fibrillation ([place])    B12 deficiency    Barrett's esophagus    CAD (coronary artery disease) 03/2006   3.0 x 20 mm TAXUS Perseus DES to the LAD; 01/2007  L main 30%, oLAD 50%, pLAD stent ok, CFX 80%, OM 60%, pRCA 60%, mRCA 70%, oPDA 90%; med rx    Cancer ([place])    skin cancer    Cataract    bilateral removal of cateracts   COLONIC POLYPS 07/17/2008   Qualifier: Diagnosis of  By: Lovette Cliche, CNA, Christy     Complication of anesthesia     no issues,but pt prefers spinal due to Pulmonary problems   COPD (chronic obstructive pulmonary disease) ([place])    oxygen  on standby in home.   Depression    Diverticulosis    Emphysema    Enlarged prostate with urinary retention    Esophageal stenosis    Gastric ulcer    GERD (gastroesophageal reflux disease)    Hiatal hernia    Hypothyroidism    Neuropathy    Osteoporosis    Pituitary macroadenoma ([facility])    Restless legs syndrome (RLS)    Tubular adenoma of colon    UGIB (upper gastrointestinal bleed) 03/2013   EGD w/ large ulcer at GE junction    Family History  Problem Relation Age of Onset   Heart disease Sister    Heart failure Father    Other Mother        phebitis related to Aaditya's birth, he was 53 weeks old when she died   Colon cancer Neg Hx    Esophageal cancer Neg Hx    Rectal cancer Neg Hx    Stomach cancer Neg Hx    Colon polyps Neg Hx     Past Surgical  History:  Procedure Laterality Date   ABDOMINAL HERNIA REPAIR   2008   BIOPSY  06/13/2020   Procedure: BIOPSY;  Surgeon: Montez Morita, Quillian Quince, MD;  [facility];  Service: Gastroenterology;;  gastric   CARDIAC CATHETERIZATION  01/2007   L main 30%, oLAD 50%,  pLAD stent ok, CFX 80%, OM 60%, pRCA 60%, mRCA 70%, oPDA 90%; med rx   cataract extraction both eyes  COLONOSCOPY     COLONOSCOPY WITH PROPOFOL N/A 06/13/2020   Procedure: COLONOSCOPY WITH PROPOFOL;  Surgeon: Harvel Quale, MD;  [facility];  Service: Gastroenterology;  Laterality: N/A;   CORONARY ANGIOPLASTY     CORONARY STENT PLACEMENT  03/2006   3.0 x 20 mm TAXUS Perseus DES to the LAD   CYSTOSCOPY N/A 06/10/2015   Procedure: CYSTOSCOPY FULGRATION OF BLEEDING,  electovapor resection;  Surgeon: Irine Seal, MD;  Location: [facility];  Service: Urology;  Laterality: N/A;   CYSTOSCOPY WITH INSERTION OF UROLIFT N/A 06/01/2015   Procedure: CYSTOSCOPY WITH INSERTION OF UROLIFT x4;  Surgeon: Irine Seal, MD;  [facility];  Service: Urology;  Laterality: N/A;   ESOPHAGOGASTRODUODENOSCOPY N/A 04/20/2013   Procedure: ESOPHAGOGASTRODUODENOSCOPY (EGD);  Surgeon: Inda Castle, MD;  [facility];  Service: Endoscopy;  Laterality: N/A;   ESOPHAGOGASTRODUODENOSCOPY N/A 03/25/2016   Procedure: ESOPHAGOGASTRODUODENOSCOPY (EGD);  Surgeon: Irene Shipper, MD;  [facility];  Service: Endoscopy;  Laterality: N/A;   ESOPHAGOGASTRODUODENOSCOPY (EGD) WITH PROPOFOL N/A 10/13/2015   Procedure: ESOPHAGOGASTRODUODENOSCOPY (EGD) WITH PROPOFOL;  Surgeon: Jerene Bears, MD;  [facility];  Service: Gastroenterology;  Laterality: N/A;   ESOPHAGOGASTRODUODENOSCOPY (EGD) WITH PROPOFOL N/A 06/13/2020   Procedure: ESOPHAGOGASTRODUODENOSCOPY (EGD) WITH PROPOFOL;  Surgeon: Harvel Quale, MD;  [facility];  Service: Gastroenterology;  Laterality: N/A;   FOOT SURGERY  1994 left, 2002  right foot   bilateral foot reconstruciton   FOOT SURGERY     reconstruction of both feet- no retained hardware.   HERNIA REPAIR     HIATAL HERNIA REPAIR  01-04-2008   JOINT REPLACEMENT     MELANOMA EXCISION  2019   POLYPECTOMY     POLYPECTOMY  06/13/2020   Procedure: POLYPECTOMY;  Surgeon: Harvel Quale, MD;  [facility];  Service: Gastroenterology;;  ascending,    PROSTATE SURGERY     x 2   SAVORY DILATION N/A 03/25/2016   Procedure: SAVORY DILATION;  Surgeon: Irene Shipper, MD;  [facility];  Service: Endoscopy;  Laterality: N/A;   TOTAL HIP ARTHROPLASTY  07/21/2011   Procedure: TOTAL HIP ARTHROPLASTY ANTERIOR APPROACH;  Surgeon: Mauri Pole, MD;  [facility];  Service: Orthopedics;  Laterality: Left;   TOTAL HIP ARTHROPLASTY Right 09/07/2012   Procedure: RIGHT TOTAL HIP ARTHROPLASTY ANTERIOR APPROACH;  Surgeon: Mcarthur Rossetti, MD;  [facility];  Service: Orthopedics;  Laterality: Right;   TRANSURETHRAL RESECTION OF BLADDER NECK N/A 11/04/2015   Procedure: RESECTION OF BLADDER NECK;  Surgeon: Cleon Gustin, MD;  [facility];  Service: Urology;  Laterality: N/A;   TRANSURETHRAL RESECTION OF PROSTATE N/A 11/04/2015   Procedure: TRANSURETHRAL RESECTION OF THE PROSTATE (TURP); REMOVAL OF UROLIFT IMPLANTS X THREE;  Surgeon: Cleon Gustin, MD;  [facility];  Service: Urology;  Laterality: N/A;   UPPER GASTROINTESTINAL ENDOSCOPY  12/2013   Dr Hilarie Fredrickson, gastritis   VIDEO BRONCHOSCOPY Bilateral 02/01/2017   Procedure: VIDEO BRONCHOSCOPY WITHOUT FLUORO;  Surgeon: Tanda Rockers, MD;  [facility];  Service: Cardiopulmonary;  Laterality: Bilateral;   Social History   Occupational History   Occupation: retired  Tobacco Use   Smoking status: Never   Smokeless tobacco: Never  Vaping Use   Vaping Use: Never used  Substance and Sexual Activity   Alcohol use: No   Drug use: No   Sexual activity: Not Currently     Birth control/protection: None

## 2021-09-09 ENCOUNTER — Ambulatory Visit (INDEPENDENT_AMBULATORY_CARE_PROVIDER_SITE_OTHER): Payer: Medicare Other | Admitting: Family Medicine

## 2021-09-09 ENCOUNTER — Ambulatory Visit (INDEPENDENT_AMBULATORY_CARE_PROVIDER_SITE_OTHER): Payer: Medicare Other

## 2021-09-09 ENCOUNTER — Encounter: Payer: Self-pay | Admitting: Family Medicine

## 2021-09-09 VITALS — BP 133/83 | HR 90 | Temp 97.4°F | Ht 65.0 in | Wt 159.8 lb

## 2021-09-09 DIAGNOSIS — R2689 Other abnormalities of gait and mobility: Secondary | ICD-10-CM | POA: Diagnosis not present

## 2021-09-09 DIAGNOSIS — W19XXXA Unspecified fall, initial encounter: Secondary | ICD-10-CM | POA: Diagnosis not present

## 2021-09-09 DIAGNOSIS — K2100 Gastro-esophageal reflux disease with esophagitis, without bleeding: Secondary | ICD-10-CM | POA: Diagnosis not present

## 2021-09-09 DIAGNOSIS — M533 Sacrococcygeal disorders, not elsewhere classified: Secondary | ICD-10-CM

## 2021-09-09 DIAGNOSIS — D539 Nutritional anemia, unspecified: Secondary | ICD-10-CM

## 2021-09-09 NOTE — Patient Instructions (Addendum)
   She needs to try to increase the amount of iron rich foods he is eating, such as beef, pork, chicken, [place], scallops, shrimp, salmon, eggs, canned chunk like tuna, grains, fish eggs, peanut butter, beans (black, kidney, lima, pinto), lentils, tofu, hummus, dark leafy green vegetables (current greens, kale, broccoli), oatmeal, and iron enriched breads, pastas, and cereals.

## 2021-09-09 NOTE — Progress Notes (Signed)
Assessment & Plan:  1. Sacral pain Encouraged patient to purchase a coccyx seat cushion.  Continue tramadol for pain relief.  2. Fall, initial encounter - DG Sacrum/Coccyx; Future  3. Balance problem Continue using cane or walker for stability. - Ambulatory referral to Physical Therapy  4. Gastroesophageal reflux disease with esophagitis without hemorrhage Encouraged to take pantoprazole twice daily as prescribed.  5. Macrocytic anemia - CBC with Differential/Platelet   Follow up plan: Return if symptoms worsen or fail to improve.  Hendricks Limes, MSN, APRN, FNP-C [facility]  Subjective:   Patient ID: [name], male    DOB: 1933/02/16, 88 y.o.   MRN: [id number]  HPI: [name] is a 88 y.o. male presenting on 09/09/2021 for Tailbone Pain (X 2 days ago after a fall/)  Patient reports 2 days ago he lost his balance and fell backwards landing on his buttocks.  He has since been having tailbone pain.  He chronically takes tramadol twice daily and has continued to do so to treat this pain.  He uses a walker with wheels in the front at home, but has a cane with him today.  Patient reports epigastric pain due to his acid reflux.  He is currently only taking pantoprazole once daily instead of twice daily as prescribed.  He is requesting his hemoglobin to be checked due to history of anemia.   ROS: Negative unless specifically indicated above in HPI.   Relevant past medical history reviewed and updated as indicated.   Allergies and medications reviewed and updated.   Current Outpatient Medications:    albuterol (VENTOLIN HFA) 108 (90 Base) MCG/ACT inhaler, Inhale 2 puffs into the lungs every 6 (six) hours as needed for wheezing or shortness of breath., Disp: 8 g, Rfl: 5   Ascorbic Acid (VITAMIN C) 500 MG CAPS, Take 1 capsule by mouth daily., Disp: , Rfl:    aspirin EC 81 MG tablet, Take 1 tablet (81 mg total) by mouth daily. Swallow whole.,  Disp: 90 tablet, Rfl: 3   bromocriptine (PARLODEL) 2.5 MG tablet, Take 0.5 tablets (1.25 mg total) by mouth 2 (two) times daily., Disp: 90 tablet, Rfl: 3   Budeson-Glycopyrrol-Formoterol (BREZTRI AEROSPHERE) 160-9-4.8 MCG/ACT AERO, Inhale 2 puffs into the lungs 2 (two) times daily., Disp: 10.7 g, Rfl: 11   budesonide-formoterol (SYMBICORT) 160-4.5 MCG/ACT inhaler, Inhale 2 puffs into the lungs in the morning and at bedtime., Disp: 10.2 g, Rfl: 6   cholecalciferol (VITAMIN D) 1000 UNITS tablet, Take 1,000 Units by mouth daily. , Disp: , Rfl:    diclofenac Sodium (VOLTAREN) 1 % GEL, Apply 2 g topically 4 (four) times daily., Disp: 50 g, Rfl: 5   diphenhydramine-acetaminophen (TYLENOL PM) 25-500 MG TABS tablet, Take 1 tablet by mouth at bedtime as needed., Disp: , Rfl:    FEROSUL 325 (65 Fe) MG tablet, TAKE (1) TABLET DAILY WITH BREAKFAST., Disp: 90 tablet, Rfl: 3   GAVILAX 17 GM/SCOOP powder, DISSOLVE 17 GM SCOOP IN 8 OZ. OF WATER AND DRINK ONCE DAILY AS NEEDED FOR CONSTIPATION (Patient taking differently: Take 17 g by mouth. Every 3-4 days), Disp: 510 g, Rfl: 0   levothyroxine (SYNTHROID) 75 MCG tablet, Take 1 tablet daily., Disp: 90 tablet, Rfl: 1   magnesium oxide (MAG-OX) 400 (241.3 Mg) MG tablet, Take 1 tablet (400 mg total) by mouth 2 (two) times daily. (Patient taking differently: Take 400 mg by mouth as needed.), Disp: 15 tablet, Rfl: 0   naphazoline-pheniramine (NAPHCON-A) 0.025-0.3 %  ophthalmic solution, Place 1 drop into the left eye 2 (two) times daily as needed for irritation or allergies., Disp: , Rfl:    nitroGLYCERIN (NITROSTAT) 0.4 MG SL tablet, Place 1 tablet (0.4 mg total) under the tongue every 5 (five) minutes as needed for chest pain., Disp: 25 tablet, Rfl: 2   pantoprazole (PROTONIX) 40 MG tablet, TAKE ONE TABLET TWICE DAILY, Disp: 180 tablet, Rfl: 0   psyllium (METAMUCIL) 28 % packet, Take 1 packet by mouth daily., Disp: 30 packet, Rfl: 1   Respiratory Therapy Supplies (FLUTTER)  DEVI, Twice a day and prn as needed, may increase if feeling worse, Disp: 1 each, Rfl: 0   sodium chloride HYPERTONIC 3 % nebulizer solution, Take by nebulization as needed for other. (Patient taking differently: Take 4 mLs by nebulization daily.), Disp: 750 mL, Rfl: 12   sucralfate (CARAFATE) 1 g tablet, Take 1 tablet three times daily as needed, Disp: 270 tablet, Rfl: 3   tamsulosin (FLOMAX) 0.4 MG CAPS capsule, Take 1 capsule (0.4 mg total) by mouth daily after supper., Disp: 90 capsule, Rfl: 3   traMADol (ULTRAM) 50 MG tablet, Take 1 tablet (50 mg total) by mouth 2 (two) times daily as needed., Disp: 30 tablet, Rfl: 2   triamcinolone cream (KENALOG) 0.1 %, Apply 1 application topically 2 (two) times daily. (Patient taking differently: Apply 1 application  topically 2 (two) times daily as needed (itching).), Disp: 30 g, Rfl: 0   vitamin B-12 (CYANOCOBALAMIN) 1000 MCG tablet, Take 1,000 mcg by mouth daily., Disp: , Rfl:    vitamin E 180 MG (400 UNITS) capsule, Take 400 Units by mouth daily., Disp: , Rfl:    bisacodyl (DULCOLAX) 10 MG suppository, Place 10 mg rectally as needed for moderate constipation. (Patient not taking: Reported on 09/09/2021), Disp: , Rfl:    bisacodyl (FLEET) 10 MG/30ML ENEM, Place 10 mg rectally daily as needed (severe constipation). (Patient not taking: Reported on 09/09/2021), Disp: , Rfl:   Allergies  Allergen Reactions   Gadavist [Gadobutrol] Hives   Prednisone     Agitation and hallucinations   Betadine [Povidone Iodine] Other (See Comments)    Blisters    Lortab [Hydrocodone-Acetaminophen] Nausea And Vomiting   Penicillins Other (See Comments)    Lightheadedness  Has patient had a PCN reaction causing immediate rash, facial/tongue/throat swelling, SOB or lightheadedness with hypotension: Yes Has patient had a PCN reaction causing severe rash involving mucus membranes or skin necrosis: No Has patient had a PCN reaction that required hospitalization No Has patient  had a PCN reaction occurring within the last 10 years: No If all of the above answers are "NO", then may proceed with Cephalosporin use.   Zetia [Ezetimibe] Other (See Comments)    Muscle weakness    Zocor [Simvastatin] Nausea Only and Other (See Comments)    Muscle weakness     Objective:   BP 133/83   Pulse 90   Temp (!) 97.4 F (36.3 C) (Temporal)   Ht '5\' 5"'$  (1.651 m)   Wt 159 lb 12.8 oz (72.5 kg)   SpO2 96%   BMI 26.59 kg/m    Physical Exam Vitals reviewed.  Constitutional:      General: He is not in acute distress.    Appearance: Normal appearance. He is not ill-appearing, toxic-appearing or diaphoretic.  HENT:     Head: Normocephalic and atraumatic.  Eyes:     General: No scleral icterus.       Right eye: No discharge.  Left eye: No discharge.     Conjunctiva/sclera: Conjunctivae normal.  Cardiovascular:     Rate and Rhythm: Normal rate.  Pulmonary:     Effort: Pulmonary effort is normal. No respiratory distress.  Musculoskeletal:        General: Normal range of motion.     Cervical back: Normal range of motion.     Comments: Sacral pain.  Skin:    General: Skin is warm and dry.  Neurological:     Mental Status: He is alert and oriented to person, place, and time. Mental status is at baseline.     Gait: Gait abnormal (walking with cane).  Psychiatric:        Mood and Affect: Mood normal.        Behavior: Behavior normal.        Thought Content: Thought content normal.        Judgment: Judgment normal.

## 2021-09-10 ENCOUNTER — Other Ambulatory Visit: Payer: Self-pay | Admitting: Family Medicine

## 2021-09-10 DIAGNOSIS — R7989 Other specified abnormal findings of blood chemistry: Secondary | ICD-10-CM

## 2021-09-10 LAB — CBC WITH DIFFERENTIAL/PLATELET
Basophils Absolute: 0.3 10*3/uL — ABNORMAL HIGH (ref 0.0–0.2)
Basos: 2 %
EOS (ABSOLUTE): 0.1 10*3/uL (ref 0.0–0.4)
Eos: 1 %
Hematocrit: 38.3 % (ref 37.5–51.0)
Hemoglobin: 12.3 g/dL — ABNORMAL LOW (ref 13.0–17.7)
Lymphocytes Absolute: 1.4 10*3/uL (ref 0.7–3.1)
Lymphs: 11 %
MCH: 29.6 pg (ref 26.6–33.0)
MCHC: 32.1 g/dL (ref 31.5–35.7)
MCV: 92 fL (ref 79–97)
Monocytes Absolute: 1.2 10*3/uL — ABNORMAL HIGH (ref 0.1–0.9)
Monocytes: 9 %
Neutrophils Absolute: 9.1 10*3/uL — ABNORMAL HIGH (ref 1.4–7.0)
Neutrophils: 70 %
Platelets: 280 10*3/uL (ref 150–450)
RBC: 4.15 x10E6/uL (ref 4.14–5.80)
RDW: 13.7 % (ref 11.6–15.4)
WBC: 12.8 10*3/uL — ABNORMAL HIGH (ref 3.4–10.8)

## 2021-09-10 LAB — IMMATURE CELLS
Bands(Auto) Relative: 1 %
Metamyelocytes: 6 % — ABNORMAL HIGH (ref 0–0)

## 2021-09-13 ENCOUNTER — Telehealth: Payer: Self-pay | Admitting: Family Medicine

## 2021-09-13 NOTE — Telephone Encounter (Signed)
Pt informed that the appt with hematology is a consultation to discuss his health and do further lab testing then we can do here in our office. Advised if numbers for tests that they perform are not WNL then he could possibly be admitted but more than likely not the day of the appt with them.  Pt understood and has no further concerns.

## 2021-09-14 ENCOUNTER — Telehealth: Payer: Self-pay | Admitting: Family Medicine

## 2021-09-14 NOTE — Telephone Encounter (Signed)
At this point, I would encourage patient to eat a balanced high protein diet and keep hydrated, I did go over labs and didn't see anything signifant to cause patient to feel week, has  patient been sick lately

## 2021-09-14 NOTE — Telephone Encounter (Signed)
Patient aware and verbalizes understanding. Patient has not been sick lately.

## 2021-09-14 NOTE — Telephone Encounter (Signed)
Covering pcp please advise. 

## 2021-09-15 ENCOUNTER — Other Ambulatory Visit: Payer: Self-pay

## 2021-09-15 ENCOUNTER — Ambulatory Visit: Payer: Medicare Other | Attending: Family Medicine

## 2021-09-15 DIAGNOSIS — R2689 Other abnormalities of gait and mobility: Secondary | ICD-10-CM | POA: Insufficient documentation

## 2021-09-15 DIAGNOSIS — Z9181 History of falling: Secondary | ICD-10-CM | POA: Diagnosis present

## 2021-09-15 DIAGNOSIS — M6281 Muscle weakness (generalized): Secondary | ICD-10-CM | POA: Diagnosis present

## 2021-09-15 NOTE — Therapy (Signed)
OUTPATIENT PHYSICAL THERAPY LOWER EXTREMITY EVALUATION   Patient Name: [name] MRN: [id number] DOB:December 27, 1932, 88 y.o., male Today's Date: 09/15/2021   PT End of Session - 09/15/21 1030     Visit Number 1    Number of Visits 12    Date for PT Re-Evaluation 11/26/21    PT Start Time 1031    PT Stop Time 1111    PT Time Calculation (min) 40 min    Activity Tolerance Patient tolerated treatment well    Behavior During Therapy [facility] for tasks assessed/performed             Past Medical History:  Diagnosis Date   Abnormality of gait 06/07/2013   Anemia    Aneurysm ([place])    on assending aorta, currently watching it, Dr Cyndia Bent   Anxiety    Arthritis    Asthma    Atrial fibrillation ([place])    B12 deficiency    Barrett's esophagus    CAD (coronary artery disease) 03/2006   3.0 x 20 mm TAXUS Perseus DES to the LAD; 01/2007  L main 30%, oLAD 50%, pLAD stent ok, CFX 80%, OM 60%, pRCA 60%, mRCA 70%, oPDA 90%; med rx    Cancer ([place])    skin cancer    Cataract    bilateral removal of cateracts   COLONIC POLYPS 07/17/2008   Qualifier: Diagnosis of  By: Lovette Cliche, CNA, Christy     Complication of anesthesia     no issues,but pt prefers spinal due to Pulmonary problems   COPD (chronic obstructive pulmonary disease) ([place])    oxygen  on standby in home.   Depression    Diverticulosis    Emphysema    Enlarged prostate with urinary retention    Esophageal stenosis    Gastric ulcer    GERD (gastroesophageal reflux disease)    Hiatal hernia    Hypothyroidism    Neuropathy    Osteoporosis    Pituitary macroadenoma (HCC)    Restless legs syndrome (RLS)    Tubular adenoma of colon    UGIB (upper gastrointestinal bleed) 03/2013   EGD w/ large ulcer at GE junction   Past Surgical History:  Procedure Laterality Date   ABDOMINAL HERNIA REPAIR   2008   BIOPSY  06/13/2020   Procedure: BIOPSY;  Surgeon: Montez Morita, Quillian Quince, MD;  [facility];  Service:  Gastroenterology;;  gastric   CARDIAC CATHETERIZATION  01/2007   L main 30%, oLAD 50%,  pLAD stent ok, CFX 80%, OM 60%, pRCA 60%, mRCA 70%, oPDA 90%; med rx   cataract extraction both eyes     COLONOSCOPY     COLONOSCOPY WITH PROPOFOL N/A 06/13/2020   Procedure: COLONOSCOPY WITH PROPOFOL;  Surgeon: Harvel Quale, MD;  [facility];  Service: Gastroenterology;  Laterality: N/A;   CORONARY ANGIOPLASTY     CORONARY STENT PLACEMENT  03/2006   3.0 x 20 mm TAXUS Perseus DES to the LAD   CYSTOSCOPY N/A 06/10/2015   Procedure: CYSTOSCOPY FULGRATION OF BLEEDING,  electovapor resection;  Surgeon: Irine Seal, MD;  [facility];  Service: Urology;  Laterality: N/A;   CYSTOSCOPY WITH INSERTION OF UROLIFT N/A 06/01/2015   Procedure: CYSTOSCOPY WITH INSERTION OF UROLIFT x4;  Surgeon: Irine Seal, MD;  [facility];  Service: Urology;  Laterality: N/A;   ESOPHAGOGASTRODUODENOSCOPY N/A 04/20/2013   Procedure: ESOPHAGOGASTRODUODENOSCOPY (EGD);  Surgeon: Inda Castle, MD;  [facility];  Service: Endoscopy;  Laterality: N/A;  ESOPHAGOGASTRODUODENOSCOPY N/A 03/25/2016   Procedure: ESOPHAGOGASTRODUODENOSCOPY (EGD);  Surgeon: Irene Shipper, MD;  [facility];  Service: Endoscopy;  Laterality: N/A;   ESOPHAGOGASTRODUODENOSCOPY (EGD) WITH PROPOFOL N/A 10/13/2015   Procedure: ESOPHAGOGASTRODUODENOSCOPY (EGD) WITH PROPOFOL;  Surgeon: Jerene Bears, MD;  [facility];  Service: Gastroenterology;  Laterality: N/A;   ESOPHAGOGASTRODUODENOSCOPY (EGD) WITH PROPOFOL N/A 06/13/2020   Procedure: ESOPHAGOGASTRODUODENOSCOPY (EGD) WITH PROPOFOL;  Surgeon: Harvel Quale, MD;  [facility];  Service: Gastroenterology;  Laterality: N/A;   FOOT SURGERY  1994 left, 2002 right foot   bilateral foot reconstruciton   FOOT SURGERY     reconstruction of both feet- no retained hardware.   HERNIA REPAIR     HIATAL HERNIA REPAIR  01-04-2008   JOINT REPLACEMENT      MELANOMA EXCISION  2019   POLYPECTOMY     POLYPECTOMY  06/13/2020   Procedure: POLYPECTOMY;  Surgeon: Harvel Quale, MD;  [facility];  Service: Gastroenterology;;  ascending,    PROSTATE SURGERY     x 2   SAVORY DILATION N/A 03/25/2016   Procedure: SAVORY DILATION;  Surgeon: Irene Shipper, MD;  [facility];  Service: Endoscopy;  Laterality: N/A;   TOTAL HIP ARTHROPLASTY  07/21/2011   Procedure: TOTAL HIP ARTHROPLASTY ANTERIOR APPROACH;  Surgeon: Mauri Pole, MD;  [facility];  Service: Orthopedics;  Laterality: Left;   TOTAL HIP ARTHROPLASTY Right 09/07/2012   Procedure: RIGHT TOTAL HIP ARTHROPLASTY ANTERIOR APPROACH;  Surgeon: Mcarthur Rossetti, MD;  [facility];  Service: Orthopedics;  Laterality: Right;   TRANSURETHRAL RESECTION OF BLADDER NECK N/A 11/04/2015   Procedure: RESECTION OF BLADDER NECK;  Surgeon: Cleon Gustin, MD;  [facility];  Service: Urology;  Laterality: N/A;   TRANSURETHRAL RESECTION OF PROSTATE N/A 11/04/2015   Procedure: TRANSURETHRAL RESECTION OF THE PROSTATE (TURP); REMOVAL OF UROLIFT IMPLANTS X THREE;  Surgeon: Cleon Gustin, MD;  [facility];  Service: Urology;  Laterality: N/A;   UPPER GASTROINTESTINAL ENDOSCOPY  12/2013   Dr Hilarie Fredrickson, gastritis   VIDEO BRONCHOSCOPY Bilateral 02/01/2017   Procedure: VIDEO BRONCHOSCOPY WITHOUT FLUORO;  Surgeon: Tanda Rockers, MD;  [facility];  Service: Cardiopulmonary;  Laterality: Bilateral;   Patient Active Problem List   Diagnosis Date Noted   Barrett's esophagus 06/09/2020   Acquired dilation of ascending aorta and aortic root ([place]) 05/20/2020   Memory disorder 05/03/2019   Chronic constipation 10/17/2018   Bronchiectasis with acute exacerbation ([place]) 11/27/2017   Peripheral vascular insufficiency ([place]) 07/19/2017   Aneurysmal dilatation ([place]) 07/19/2017   Aortic atherosclerosis ([place]) 11/21/2016   Thoracic ascending aortic aneurysm ([place])  08/10/2016   CAD (coronary artery disease) 08/10/2016   Hiatal hernia 05/04/2016   Afib ([place]) 06/12/2015   Neuropathy 12/31/2013   Vitamin B 12 deficiency 12/31/2013   Personal history of colonic polyps 01/03/2011   COPD (chronic obstructive pulmonary disease) ([place]) 08/24/2010   COLONIC POLYPS 07/17/2008   Macrocytic anemia 07/17/2008   Gastroesophageal reflux disease with esophagitis 07/17/2008   DEGENERATIVE JOINT DISEASE 07/17/2008   Osteoporosis 07/17/2008   BPH (benign prostatic hyperplasia) 07/17/2008   Hyperprolactinemia ([place]) 10/24/2007   PITUITARY ADENOMA 06/25/2007   Hypothyroidism 06/25/2007   Hyperlipidemia LDL goal <70 06/22/2007    PCP: Dettinger, [name], MD   REFERRING PROVIDER: [name], FNP  REFERRING DIAG: Balance problem  THERAPY DIAG:  History of falling  Muscle weakness (generalized)  Rationale for Evaluation and Treatment Rehabilitation  ONSET DATE: unsure, but "many months"   SUBJECTIVE:   SUBJECTIVE STATEMENT: Patient reports that he has been having balance for "many months now." However, he has never fallen when he was using a cane. He fell about a week ago when he lost his balance and fell backward. He had to lay there a while until he was able to get to furniture and climb up. He has noticed that he will "stumble some" at home while walking. He will then have to catch himself to prevent a fall.   PERTINENT HISTORY: Chronic low back, left shoulder, and right hip pain   PAIN:  Are you having pain? Yes: Pain location: low back, left shoulder, and right hip Aggravating factors: activity Relieving factors: medication and rest  PRECAUTIONS: Fall  WEIGHT BEARING RESTRICTIONS No  FALLS:  Has patient fallen in last 6 months? Yes. Number of falls 2 or more  LIVING ENVIRONMENT: Lives with: lives alone Lives in: House/apartment Stairs: Yes: Internal: 14 steps; can reach both and External: 10 steps; can reach both Has following  equipment at home: Single point cane and Ramped entry  OCCUPATION: retired  PLOF: [place] improved strength, safety, returning to regular exercise, and improved ease with his household activities   OBJECTIVE:  COGNITION:  Overall cognitive status: Within functional limits for tasks assessed     SENSATION: WFL  POSTURE: rounded shoulders and forward head  LOWER EXTREMITY ROM: WFL for tasks assessed   LOWER EXTREMITY MMT:  MMT Right eval Left eval  Hip flexion 4/5 4/5  Hip extension    Hip abduction    Hip adduction    Hip internal rotation    Hip external rotation    Knee flexion 4/5 4+/5  Knee extension 4/5 4-/5  Ankle dorsiflexion 4-/5 4-/5  Ankle plantarflexion    Ankle inversion    Ankle eversion     (Blank rows = not tested)  FUNCTIONAL TESTS:  5 times sit to stand: 20.94 seconds with upper extremity support and close supervision; unable to perform with UE support Timed up and go (TUG): 21.83 seconds with SPC     BALANCE:   Romberg: 30 seconds with eyes open and closed with increased sway   Tandem: unable to perform  GAIT: Assistive device utilized: Single point cane Level of assistance: SBA Comments: shuffle (decreased stride length and foot clearance)     TODAY'S TREATMENT:    PATIENT EDUCATION:  Education details: POC, prognosis, benefits of exercise Person educated: Patient Education method: Explanation Education comprehension: verbalized understanding   HOME EXERCISE PROGRAM:   ASSESSMENT:  CLINICAL IMPRESSION: Patient is a 88 y.o. male who was seen today for physical therapy evaluation and treatment for balance impairments. He is at a high fall risk as evidenced by his history of falling, TUG and five time sit to stand times, and his balance assessment. Recommend that he continue with skilled physical therapy to address his remaining impairments to maximize his safety and functional mobility.     OBJECTIVE  IMPAIRMENTS Abnormal gait, decreased activity tolerance, decreased balance, decreased mobility, difficulty walking, decreased strength, postural dysfunction, and pain.   ACTIVITY LIMITATIONS carrying, lifting, bending, standing, squatting, stairs, transfers, and locomotion level  PARTICIPATION LIMITATIONS: meal prep, cleaning, laundry, shopping, community activity, and yard work  PERSONAL FACTORS Age, Fitness, Time since onset of injury/illness/exacerbation, and 3+ comorbidities: a-fib, thoracic ascending aortic aneurysm, CAD, COPD, and osteoporosis  are also affecting patient's functional outcome.   REHAB POTENTIAL: Fair  CLINICAL DECISION MAKING: Evolving/moderate complexity  EVALUATION COMPLEXITY: Moderate   GOALS: Goals reviewed with patient? Yes  LONG TERM GOALS: Target date: 10/13/2021   Patient will be independent with his HEP.  Baseline:  Goal status: INITIAL  2.  Patient will improve his TUG time to 14 seconds or less for improved safety and functional mobility.  Baseline:  Goal status: INITIAL  3.  Patient will be able to improve his five time sit to stand time to 14 seconds or less for improved lower extremity power and safety.  Baseline:  Goal status: INITIAL  4.  Patient will be able to independent transfer from sitting to standing without upper extremity support.  Baseline:  Goal status: INITIAL  PLAN: PT FREQUENCY:  2-3x/ week  PT DURATION: 4 weeks  PLANNED INTERVENTIONS: Therapeutic exercises, Therapeutic activity, Neuromuscular re-education, Balance training, Gait training, Patient/Family education, Self Care, Stair training, and Re-evaluation  PLAN FOR NEXT SESSION: nustep, lower extremity strengthening and balance interventions   Darlin Coco, PT 09/15/2021, 3:13 PM

## 2021-09-20 ENCOUNTER — Ambulatory Visit: Payer: Medicare Other

## 2021-09-20 ENCOUNTER — Telehealth: Payer: Self-pay | Admitting: Family Medicine

## 2021-09-20 DIAGNOSIS — Z9181 History of falling: Secondary | ICD-10-CM | POA: Diagnosis not present

## 2021-09-20 DIAGNOSIS — M6281 Muscle weakness (generalized): Secondary | ICD-10-CM

## 2021-09-20 NOTE — Telephone Encounter (Signed)
[name]. Please advise

## 2021-09-20 NOTE — Telephone Encounter (Signed)
Patient notified and verbalized understanding. 

## 2021-09-20 NOTE — Telephone Encounter (Signed)
Vitamikn K is fine

## 2021-09-20 NOTE — Therapy (Addendum)
OUTPATIENT PHYSICAL THERAPY LOWER EXTREMITY TREATMENT   Patient Name: [name] MRN: [id number] DOB:1932-05-27, 88 y.o., male Today's Date: 09/20/2021   PT End of Session - 09/20/21 1120     Visit Number 2    Number of Visits 12    Date for PT Re-Evaluation 11/26/21    PT Start Time 1115    Activity Tolerance Patient tolerated treatment well    Behavior During Therapy [facility] for tasks assessed/performed             Past Medical History:  Diagnosis Date   Abnormality of gait 06/07/2013   Anemia    Aneurysm ([place])    on assending aorta, currently watching it, Dr Cyndia Bent   Anxiety    Arthritis    Asthma    Atrial fibrillation ([place])    B12 deficiency    Barrett's esophagus    CAD (coronary artery disease) 03/2006   3.0 x 20 mm TAXUS Perseus DES to the LAD; 01/2007  L main 30%, oLAD 50%, pLAD stent ok, CFX 80%, OM 60%, pRCA 60%, mRCA 70%, oPDA 90%; med rx    Cancer ([place])    skin cancer    Cataract    bilateral removal of cateracts   COLONIC POLYPS 07/17/2008   Qualifier: Diagnosis of  By: Lovette Cliche, CNA, Christy     Complication of anesthesia     no issues,but pt prefers spinal due to Pulmonary problems   COPD (chronic obstructive pulmonary disease) ([place])    oxygen  on standby in home.   Depression    Diverticulosis    Emphysema    Enlarged prostate with urinary retention    Esophageal stenosis    Gastric ulcer    GERD (gastroesophageal reflux disease)    Hiatal hernia    Hypothyroidism    Neuropathy    Osteoporosis    Pituitary macroadenoma (HCC)    Restless legs syndrome (RLS)    Tubular adenoma of colon    UGIB (upper gastrointestinal bleed) 03/2013   EGD w/ large ulcer at GE junction   Past Surgical History:  Procedure Laterality Date   ABDOMINAL HERNIA REPAIR   2008   BIOPSY  06/13/2020   Procedure: BIOPSY;  Surgeon: Montez Morita, Quillian Quince, MD;  [facility];  Service: Gastroenterology;;  gastric   CARDIAC CATHETERIZATION  01/2007   L main  30%, oLAD 50%,  pLAD stent ok, CFX 80%, OM 60%, pRCA 60%, mRCA 70%, oPDA 90%; med rx   cataract extraction both eyes     COLONOSCOPY     COLONOSCOPY WITH PROPOFOL N/A 06/13/2020   Procedure: COLONOSCOPY WITH PROPOFOL;  Surgeon: Harvel Quale, MD;  [facility];  Service: Gastroenterology;  Laterality: N/A;   CORONARY ANGIOPLASTY     CORONARY STENT PLACEMENT  03/2006   3.0 x 20 mm TAXUS Perseus DES to the LAD   CYSTOSCOPY N/A 06/10/2015   Procedure: CYSTOSCOPY FULGRATION OF BLEEDING,  electovapor resection;  Surgeon: Irine Seal, MD;  [facility];  Service: Urology;  Laterality: N/A;   CYSTOSCOPY WITH INSERTION OF UROLIFT N/A 06/01/2015   Procedure: CYSTOSCOPY WITH INSERTION OF UROLIFT x4;  Surgeon: Irine Seal, MD;  [facility];  Service: Urology;  Laterality: N/A;   ESOPHAGOGASTRODUODENOSCOPY N/A 04/20/2013   Procedure: ESOPHAGOGASTRODUODENOSCOPY (EGD);  Surgeon: Inda Castle, MD;  [facility];  Service: Endoscopy;  Laterality: N/A;   ESOPHAGOGASTRODUODENOSCOPY N/A 03/25/2016   Procedure: ESOPHAGOGASTRODUODENOSCOPY (EGD);  Surgeon: Irene Shipper, MD;  [facility];  Service: Endoscopy;  Laterality: N/A;   ESOPHAGOGASTRODUODENOSCOPY (EGD) WITH PROPOFOL N/A 10/13/2015   Procedure: ESOPHAGOGASTRODUODENOSCOPY (EGD) WITH PROPOFOL;  Surgeon: Jerene Bears, MD;  [facility];  Service: Gastroenterology;  Laterality: N/A;   ESOPHAGOGASTRODUODENOSCOPY (EGD) WITH PROPOFOL N/A 06/13/2020   Procedure: ESOPHAGOGASTRODUODENOSCOPY (EGD) WITH PROPOFOL;  Surgeon: Harvel Quale, MD;  [facility];  Service: Gastroenterology;  Laterality: N/A;   FOOT SURGERY  1994 left, 2002 right foot   bilateral foot reconstruciton   FOOT SURGERY     reconstruction of both feet- no retained hardware.   HERNIA REPAIR     HIATAL HERNIA REPAIR  01-04-2008   JOINT REPLACEMENT     MELANOMA EXCISION  2019   POLYPECTOMY     POLYPECTOMY  06/13/2020    Procedure: POLYPECTOMY;  Surgeon: Harvel Quale, MD;  [facility];  Service: Gastroenterology;;  ascending,    PROSTATE SURGERY     x 2   SAVORY DILATION N/A 03/25/2016   Procedure: SAVORY DILATION;  Surgeon: Irene Shipper, MD;  [facility];  Service: Endoscopy;  Laterality: N/A;   TOTAL HIP ARTHROPLASTY  07/21/2011   Procedure: TOTAL HIP ARTHROPLASTY ANTERIOR APPROACH;  Surgeon: Mauri Pole, MD;  [facility];  Service: Orthopedics;  Laterality: Left;   TOTAL HIP ARTHROPLASTY Right 09/07/2012   Procedure: RIGHT TOTAL HIP ARTHROPLASTY ANTERIOR APPROACH;  Surgeon: Mcarthur Rossetti, MD;  [facility];  Service: Orthopedics;  Laterality: Right;   TRANSURETHRAL RESECTION OF BLADDER NECK N/A 11/04/2015   Procedure: RESECTION OF BLADDER NECK;  Surgeon: Cleon Gustin, MD;  [facility];  Service: Urology;  Laterality: N/A;   TRANSURETHRAL RESECTION OF PROSTATE N/A 11/04/2015   Procedure: TRANSURETHRAL RESECTION OF THE PROSTATE (TURP); REMOVAL OF UROLIFT IMPLANTS X THREE;  Surgeon: Cleon Gustin, MD;  [facility];  Service: Urology;  Laterality: N/A;   UPPER GASTROINTESTINAL ENDOSCOPY  12/2013   Dr Hilarie Fredrickson, gastritis   VIDEO BRONCHOSCOPY Bilateral 02/01/2017   Procedure: VIDEO BRONCHOSCOPY WITHOUT FLUORO;  Surgeon: Tanda Rockers, MD;  [facility];  Service: Cardiopulmonary;  Laterality: Bilateral;   Patient Active Problem List   Diagnosis Date Noted   Barrett's esophagus 06/09/2020   Acquired dilation of ascending aorta and aortic root ([place]) 05/20/2020   Memory disorder 05/03/2019   Chronic constipation 10/17/2018   Bronchiectasis with acute exacerbation ([place]) 11/27/2017   Peripheral vascular insufficiency ([place]) 07/19/2017   Aneurysmal dilatation ([place]) 07/19/2017   Aortic atherosclerosis ([place]) 11/21/2016   Thoracic ascending aortic aneurysm ([place]) 08/10/2016   CAD (coronary artery disease) 08/10/2016   Hiatal hernia  05/04/2016   Afib ([place]) 06/12/2015   Neuropathy 12/31/2013   Vitamin B 12 deficiency 12/31/2013   Personal history of colonic polyps 01/03/2011   COPD (chronic obstructive pulmonary disease) ([place]) 08/24/2010   COLONIC POLYPS 07/17/2008   Macrocytic anemia 07/17/2008   Gastroesophageal reflux disease with esophagitis 07/17/2008   DEGENERATIVE JOINT DISEASE 07/17/2008   Osteoporosis 07/17/2008   BPH (benign prostatic hyperplasia) 07/17/2008   Hyperprolactinemia ([place]) 10/24/2007   PITUITARY ADENOMA 06/25/2007   Hypothyroidism 06/25/2007   Hyperlipidemia LDL goal <70 06/22/2007    PCP: Dettinger, [name], MD   REFERRING PROVIDER: [name], FNP  REFERRING DIAG: Balance problem  THERAPY DIAG:  History of falling  Muscle weakness (generalized)  Rationale for Evaluation and Treatment Rehabilitation  ONSET DATE: unsure, but "many months"   SUBJECTIVE:   SUBJECTIVE STATEMENT:  Pt arrives for today's treatment session denying any pain and states he is feeling "pretty good" today.   PERTINENT HISTORY: Chronic low back, left shoulder, and right hip pain   PAIN:  Are you having pain? No  PRECAUTIONS: Fall  WEIGHT BEARING RESTRICTIONS No  FALLS:  Has patient fallen in last 6 months? Yes. Number of falls 2 or more   PATIENT GOALS improved strength, safety, returning to regular exercise, and improved ease with his household activities   OBJECTIVE:   LOWER EXTREMITY ROM: WFL for tasks assessed   LOWER EXTREMITY MMT:  MMT Right eval Left eval  Hip flexion 4/5 4/5  Hip extension    Hip abduction    Hip adduction    Hip internal rotation    Hip external rotation    Knee flexion 4/5 4+/5  Knee extension 4/5 4-/5  Ankle dorsiflexion 4-/5 4-/5  Ankle plantarflexion    Ankle inversion    Ankle eversion     (Blank rows = not tested)  FUNCTIONAL TESTS:  5 times sit to stand: 20.94 seconds with upper extremity support and close supervision; unable to  perform with UE support Timed up and go (TUG): 21.83 seconds with SPC     BALANCE:   Romberg: 30 seconds with eyes open and closed with increased sway   Tandem: unable to perform  GAIT: Assistive device utilized: Single point cane Level of assistance: SBA Comments: shuffle (decreased stride length and foot clearance)     TODAY'S TREATMENT:                                 7/24   EXERCISE LOG  Exercise Repetitions and Resistance Comments  Nustep Lvl 3 x 15 mins   Heel/Toe Raises 20 reps each   Forward Step Ups 6 inch step, 20 reps, BIL, BUE support   Rockerboard 3 mins   Marching Airex x 3 mins   Narrow BOS 2 x 60 secs Intermittent UE support for safety  Tandem Stance 2 x 60 secs  Intermittent UE support for safety  LAQ    Seated marches    Hip Abduction    Ball Squeezes     Blank cell = exercise not performed today    PATIENT EDUCATION:  Education details: POC, prognosis, benefits of exercise Person educated: Patient Education method: Explanation Education comprehension: verbalized understanding   HOME EXERCISE PROGRAM:   ASSESSMENT:  CLINICAL IMPRESSION: Pt arrives for today's treatment session denying any pain and states that he is feeling good today.  Pt instructed in numerous standing exercises and balance activities to increase strength and function.  Pt requiring min cues for proper technique with all newly added exercises.  Pt most challenged by tandem stance on Airex pad.  Pt denied any pain at completion of today's treatment session, but did endorse increased fatigue.   PHYSICAL THERAPY DISCHARGE SUMMARY  Visits from Start of Care: 2  Current functional level related to goals / functional outcomes: Patient was unable to meet any of his goals for skilled physical therapy as he did not return following his second appointment.    Remaining deficits: Lower extremity, strength, power, and stability   Education / Equipment: HEP   Patient agrees to  discharge. Patient goals were not met. Patient is being discharged due to not returning since the last visit.  Jacqulynn Cadet, PT, DPT    OBJECTIVE IMPAIRMENTS Abnormal gait, decreased activity tolerance,  decreased balance, decreased mobility, difficulty walking, decreased strength, postural dysfunction, and pain.   ACTIVITY LIMITATIONS carrying, lifting, bending, standing, squatting, stairs, transfers, and locomotion level  PARTICIPATION LIMITATIONS: meal prep, cleaning, laundry, shopping, community activity, and yard work  PERSONAL FACTORS Age, Fitness, Time since onset of injury/illness/exacerbation, and 3+ comorbidities: a-fib, thoracic ascending aortic aneurysm, CAD, COPD, and osteoporosis  are also affecting patient's functional outcome.   REHAB POTENTIAL: Fair    CLINICAL DECISION MAKING: Evolving/moderate complexity  EVALUATION COMPLEXITY: Moderate   GOALS: Goals reviewed with patient? Yes  LONG TERM GOALS: Target date: 10/13/2021   Patient will be independent with his HEP.  Baseline:  Goal status: INITIAL  2.  Patient will improve his TUG time to 14 seconds or less for improved safety and functional mobility.  Baseline:  Goal status: INITIAL  3.  Patient will be able to improve his five time sit to stand time to 14 seconds or less for improved lower extremity power and safety.  Baseline:  Goal status: INITIAL  4.  Patient will be able to independent transfer from sitting to standing without upper extremity support.  Baseline:  Goal status: INITIAL  PLAN: PT FREQUENCY:  2-3x/ week  PT DURATION: 4 weeks  PLANNED INTERVENTIONS: Therapeutic exercises, Therapeutic activity, Neuromuscular re-education, Balance training, Gait training, Patient/Family education, Self Care, Stair training, and Re-evaluation  PLAN FOR NEXT SESSION: nustep, lower extremity strengthening and balance interventions   Kathrynn Ducking, PTA 09/20/2021, 11:21 AM

## 2021-09-23 ENCOUNTER — Ambulatory Visit: Payer: Medicare Other | Admitting: "Endocrinology

## 2021-09-23 ENCOUNTER — Encounter: Payer: Self-pay | Admitting: "Endocrinology

## 2021-09-23 VITALS — BP 108/58 | HR 72 | Ht 65.0 in | Wt 161.0 lb

## 2021-09-23 DIAGNOSIS — E221 Hyperprolactinemia: Secondary | ICD-10-CM

## 2021-09-23 DIAGNOSIS — D353 Benign neoplasm of craniopharyngeal duct: Secondary | ICD-10-CM

## 2021-09-23 DIAGNOSIS — D352 Benign neoplasm of pituitary gland: Secondary | ICD-10-CM | POA: Diagnosis not present

## 2021-09-23 MED ORDER — CABERGOLINE 0.5 MG PO TABS: 0.2500 mg | ORAL_TABLET | ORAL | 1 refills | Status: DC

## 2021-09-23 NOTE — Progress Notes (Signed)
Endocrinology Consult Note                                            09/23/2021, 1:00 PM   Subjective:    Patient ID: [name], male    DOB: [birth year], PCP Dettinger, [name], MD   Past Medical History:  Diagnosis Date   Abnormality of gait 06/07/2013   Anemia    Aneurysm ([place])    on assending aorta, currently watching it, Dr Cyndia Bent   Anxiety    Arthritis    Asthma    Atrial fibrillation ([place])    B12 deficiency    Barrett's esophagus    CAD (coronary artery disease) 03/2006   3.0 x 20 mm TAXUS Perseus DES to the LAD; 01/2007  L main 30%, oLAD 50%, pLAD stent ok, CFX 80%, OM 60%, pRCA 60%, mRCA 70%, oPDA 90%; med rx    Cancer ([place])    skin cancer    Cataract    bilateral removal of cateracts   COLONIC POLYPS 07/17/2008   Qualifier: Diagnosis of  By: Lovette Cliche, CNA, Christy     Complication of anesthesia     no issues,but pt prefers spinal due to Pulmonary problems   COPD (chronic obstructive pulmonary disease) ([place])    oxygen  on standby in home.   Depression    Diverticulosis    Emphysema    Enlarged prostate with urinary retention    Esophageal stenosis    Gastric ulcer    GERD (gastroesophageal reflux disease)    Hiatal hernia    Hypothyroidism    Neuropathy    Osteoporosis    Pituitary macroadenoma (H[facility])    Restless legs syndrome (RLS)    Tubular adenoma of colon    UGIB (upper gastrointestinal bleed) 03/2013   EGD w/ large ulcer at GE junction   Past Surgical History:  Procedure Laterality Date   ABDOMINAL HERNIA REPAIR   2008   BIOPSY  06/13/2020   Procedure: BIOPSY;  Surgeon: Montez Morita, Quillian Quince, MD;  [facility];  Service: Gastroenterology;;  gastric   CARDIAC CATHETERIZATION  01/2007   L main 30%, oLAD 50%,  pLAD stent ok, CFX 80%, OM 60%, pRCA 60%, mRCA 70%, oPDA 90%; med rx   cataract extraction both eyes     COLONOSCOPY     COLONOSCOPY WITH PROPOFOL N/A 06/13/2020   Procedure: COLONOSCOPY WITH PROPOFOL;  Surgeon:  Harvel Quale, MD;  [facility];  Service: Gastroenterology;  Laterality: N/A;   CORONARY ANGIOPLASTY     CORONARY STENT PLACEMENT  03/2006   3.0 x 20 mm TAXUS Perseus DES to the LAD   CYSTOSCOPY N/A 06/10/2015   Procedure: CYSTOSCOPY FULGRATION OF BLEEDING,  electovapor resection;  Surgeon: Irine Seal, MD;  Location: [facility];  Service: Urology;  Laterality: N/A;   CYSTOSCOPY WITH INSERTION OF UROLIFT N/A 06/01/2015   Procedure: CYSTOSCOPY WITH INSERTION OF UROLIFT x4;  Surgeon: Irine Seal, MD;  [facility];  Service: Urology;  Laterality: N/A;   ESOPHAGOGASTRODUODENOSCOPY N/A 04/20/2013   Procedure: ESOPHAGOGASTRODUODENOSCOPY (EGD);  Surgeon: Inda Castle, MD;  [facility];  Service: Endoscopy;  Laterality: N/A;   ESOPHAGOGASTRODUODENOSCOPY N/A 03/25/2016   Procedure: ESOPHAGOGASTRODUODENOSCOPY (EGD);  Surgeon: Irene Shipper, MD;  Location: [facility];  Service: Endoscopy;  Laterality: N/A;   ESOPHAGOGASTRODUODENOSCOPY (EGD)  WITH PROPOFOL N/A 10/13/2015   Procedure: ESOPHAGOGASTRODUODENOSCOPY (EGD) WITH PROPOFOL;  Surgeon: Jerene Bears, MD;  [facility];  Service: Gastroenterology;  Laterality: N/A;   ESOPHAGOGASTRODUODENOSCOPY (EGD) WITH PROPOFOL N/A 06/13/2020   Procedure: ESOPHAGOGASTRODUODENOSCOPY (EGD) WITH PROPOFOL;  Surgeon: Harvel Quale, MD;  [facility];  Service: Gastroenterology;  Laterality: N/A;   FOOT SURGERY  1994 left, 2002 right foot   bilateral foot reconstruciton   FOOT SURGERY     reconstruction of both feet- no retained hardware.   HERNIA REPAIR     HIATAL HERNIA REPAIR  01-04-2008   JOINT REPLACEMENT     MELANOMA EXCISION  2019   POLYPECTOMY     POLYPECTOMY  06/13/2020   Procedure: POLYPECTOMY;  Surgeon: Harvel Quale, MD;  [facility];  Service: Gastroenterology;;  ascending,    PROSTATE SURGERY     x 2   SAVORY DILATION N/A 03/25/2016   Procedure: SAVORY DILATION;   Surgeon: Irene Shipper, MD;  [facility];  Service: Endoscopy;  Laterality: N/A;   TOTAL HIP ARTHROPLASTY  07/21/2011   Procedure: TOTAL HIP ARTHROPLASTY ANTERIOR APPROACH;  Surgeon: Mauri Pole, MD;  [facility];  Service: Orthopedics;  Laterality: Left;   TOTAL HIP ARTHROPLASTY Right 09/07/2012   Procedure: RIGHT TOTAL HIP ARTHROPLASTY ANTERIOR APPROACH;  Surgeon: Mcarthur Rossetti, MD;  [facility];  Service: Orthopedics;  Laterality: Right;   TRANSURETHRAL RESECTION OF BLADDER NECK N/A 11/04/2015   Procedure: RESECTION OF BLADDER NECK;  Surgeon: Cleon Gustin, MD;  [facility];  Service: Urology;  Laterality: N/A;   TRANSURETHRAL RESECTION OF PROSTATE N/A 11/04/2015   Procedure: TRANSURETHRAL RESECTION OF THE PROSTATE (TURP); REMOVAL OF UROLIFT IMPLANTS X THREE;  Surgeon: Cleon Gustin, MD;  [facility];  Service: Urology;  Laterality: N/A;   UPPER GASTROINTESTINAL ENDOSCOPY  12/2013   Dr Hilarie Fredrickson, gastritis   VIDEO BRONCHOSCOPY Bilateral 02/01/2017   Procedure: VIDEO BRONCHOSCOPY WITHOUT FLUORO;  Surgeon: Tanda Rockers, MD;  [facility];  Service: Cardiopulmonary;  Laterality: Bilateral;   Social History   Socioeconomic History   Marital status: Widowed    Spouse name: Not on file   Number of children: 2   Years of education: 14   Highest education level: Associate degree: occupational, Hotel manager, or vocational program  Occupational History   Occupation: retired  Tobacco Use   Smoking status: Never   Smokeless tobacco: Never  Vaping Use   Vaping Use: Never used  Substance and Sexual Activity   Alcohol use: No   Drug use: No   Sexual activity: Not Currently    Birth control/protection: None  Other Topics Concern   Not on file  Social History Narrative   Patient drinks caffeine a few times a week.   Patient is right handed.   Social Determinants of Health   Financial Resource Strain: Low Risk  (02/08/2021)   Overall  Financial Resource Strain (CARDIA)    Difficulty of Paying Living Expenses: Not hard at all  Food Insecurity: No Food Insecurity (02/08/2021)   Hunger Vital Sign    Worried About Running Out of Food in the Last Year: Never true    Ran Out of Food in the Last Year: Never true  Transportation Needs: No Transportation Needs (02/08/2021)   PRAPARE - Hydrologist (Medical): No    Lack of Transportation (Non-Medical): No  Physical Activity: Insufficiently Active (02/08/2021)   Exercise  Vital Sign    Days of Exercise per Week: 7 days    Minutes of Exercise per Session: 20 min  Stress: No Stress Concern Present (02/08/2021)   [place]    Feeling of Stress : Not at all  Social Connections: Moderately Integrated (02/08/2021)   Social Connection and Isolation Panel [NHANES]    Frequency of Communication with Friends and Family: More than three times a week    Frequency of Social Gatherings with Friends and Family: More than three times a week    Attends Religious Services: More than 4 times per year    Active Member of [employer] or Organizations: Yes    Attends Archivist Meetings: More than 4 times per year    Marital Status: Widowed   Family History  Problem Relation Age of Onset   Heart disease Sister    Heart failure Father    Other Mother        phebitis related to Tiran's birth, he was 22 weeks old when she died   Colon cancer Neg Hx    Esophageal cancer Neg Hx    Rectal cancer Neg Hx    Stomach cancer Neg Hx    Colon polyps Neg Hx    Outpatient Encounter Medications as of 09/23/2021  Medication Sig   cabergoline (DOSTINEX) 0.5 MG tablet Take 0.5 tablets (0.25 mg total) by mouth 2 (two) times a week.   albuterol (VENTOLIN HFA) 108 (90 Base) MCG/ACT inhaler Inhale 2 puffs into the lungs every 6 (six) hours as needed for wheezing or shortness of breath.   Ascorbic Acid (VITAMIN C) 500  MG CAPS Take 1 capsule by mouth daily.   aspirin EC 81 MG tablet Take 1 tablet (81 mg total) by mouth daily. Swallow whole.   Budeson-Glycopyrrol-Formoterol (BREZTRI AEROSPHERE) 160-9-4.8 MCG/ACT AERO Inhale 2 puffs into the lungs 2 (two) times daily.   budesonide-formoterol (SYMBICORT) 160-4.5 MCG/ACT inhaler Inhale 2 puffs into the lungs in the morning and at bedtime.   cholecalciferol (VITAMIN D) 1000 UNITS tablet Take 1,000 Units by mouth daily.    diclofenac Sodium (VOLTAREN) 1 % GEL Apply 2 g topically 4 (four) times daily.   diphenhydramine-acetaminophen (TYLENOL PM) 25-500 MG TABS tablet Take 1 tablet by mouth at bedtime as needed.   FEROSUL 325 (65 Fe) MG tablet TAKE (1) TABLET DAILY WITH BREAKFAST.   GAVILAX 17 GM/SCOOP powder DISSOLVE 17 GM SCOOP IN 8 OZ. OF WATER AND DRINK ONCE DAILY AS NEEDED FOR CONSTIPATION (Patient taking differently: Take 17 g by mouth. Every 3-4 days)   levothyroxine (SYNTHROID) 75 MCG tablet Take 1 tablet daily.   magnesium oxide (MAG-OX) 400 (241.3 Mg) MG tablet Take 1 tablet (400 mg total) by mouth 2 (two) times daily. (Patient taking differently: Take 400 mg by mouth as needed.)   naphazoline-pheniramine (NAPHCON-A) 0.025-0.3 % ophthalmic solution Place 1 drop into the left eye 2 (two) times daily as needed for irritation or allergies.   nitroGLYCERIN (NITROSTAT) 0.4 MG SL tablet Place 1 tablet (0.4 mg total) under the tongue every 5 (five) minutes as needed for chest pain.   pantoprazole (PROTONIX) 40 MG tablet TAKE ONE TABLET TWICE DAILY   psyllium (METAMUCIL) 28 % packet Take 1 packet by mouth daily.   Respiratory Therapy Supplies (FLUTTER) DEVI Twice a day and prn as needed, may increase if feeling worse   sodium chloride HYPERTONIC 3 % nebulizer solution Take by nebulization as needed for  other. (Patient taking differently: Take 4 mLs by nebulization daily.)   sucralfate (CARAFATE) 1 g tablet Take 1 tablet three times daily as needed   tamsulosin (FLOMAX)  0.4 MG CAPS capsule Take 1 capsule (0.4 mg total) by mouth daily after supper.   traMADol (ULTRAM) 50 MG tablet Take 1 tablet (50 mg total) by mouth 2 (two) times daily as needed.   triamcinolone cream (KENALOG) 0.1 % Apply 1 application topically 2 (two) times daily. (Patient taking differently: Apply 1 application  topically 2 (two) times daily as needed (itching).)   vitamin B-12 (CYANOCOBALAMIN) 1000 MCG tablet Take 1,000 mcg by mouth daily.   vitamin E 180 MG (400 UNITS) capsule Take 400 Units by mouth daily.   [DISCONTINUED] bromocriptine (PARLODEL) 2.5 MG tablet Take 0.5 tablets (1.25 mg total) by mouth 2 (two) times daily.   No facility-administered encounter medications on file as of 09/23/2021.   ALLERGIES: Allergies  Allergen Reactions   Gadavist [Gadobutrol] Hives   Prednisone     Agitation and hallucinations   Betadine [Povidone Iodine] Other (See Comments)    Blisters    Lortab [Hydrocodone-Acetaminophen] Nausea And Vomiting   Penicillins Other (See Comments)    Lightheadedness  Has patient had a PCN reaction causing immediate rash, facial/tongue/throat swelling, SOB or lightheadedness with hypotension: Yes Has patient had a PCN reaction causing severe rash involving mucus membranes or skin necrosis: No Has patient had a PCN reaction that required hospitalization No Has patient had a PCN reaction occurring within the last 10 years: No If all of the above answers are "NO", then may proceed with Cephalosporin use.   Zetia [Ezetimibe] Other (See Comments)    Muscle weakness    Zocor [Simvastatin] Nausea Only and Other (See Comments)    Muscle weakness     VACCINATION STATUS: Immunization History  Administered Date(s) Administered   Fluad Quad(high Dose 65+) 12/10/2018, 01/17/2020, 01/13/2021   Influenza Whole 11/22/2007, 10/29/2009, 10/30/2010, 11/29/2011   Influenza, High Dose Seasonal PF 12/03/2015, 12/26/2016, 12/27/2017   Influenza,inj,Quad PF,6+ Mos 12/03/2012,  11/25/2013, 12/19/2014   Moderna Sars-Covid-2 Vaccination 04/11/2019, 05/10/2019, 03/31/2020   Pneumococcal Conjugate-13 01/09/2013   Pneumococcal Polysaccharide-23 10/29/2009   Td 04/29/1998   Tdap 01/15/2019, 01/15/2019   Zoster Recombinat (Shingrix) 07/14/2021   Zoster, Live 03/31/2006    HPI WALLIE LAGRAND is 88 y.o. male who presents today with a medical history as above. he is being seen in consultation for hyperprolactinemia/pituitary adenoma requested by Dettinger, [name], MD.   History is obtained directly from the patient as well as chart review.  He reports that he was diagnosed with fibroadenoma for at least for the last 15 years.  He was treated for hyperprolactinemia for at least 5 years with largely bromocriptine 1 to twice a day continuously with mixed results.  His prolactin level remains high even though he is slowly responding. His most recent MRI showed stable finding of pituitary lesion which is approximately 1.4 cm x 2 cm x 1.5 cm. He denies visual field deficits, no retro-orbital headaches. He does not recall if he ever had bone density.  He walks with a cane for disequilibrium.  He does not have diabetes nor hypertension. He has seen Dr. Loanne Drilling in [place] for several years.  His prolactin level in May 2022 was 291, November 2022 was 196, May 2023 was 146.  Normal range of prolactin is 4-15. He does not have acute complaints today.  Review of Systems  Constitutional: no recent weight gain/loss,  no fatigue, no subjective hyperthermia, no subjective hypothermia Eyes: no blurry vision, no xerophthalmia ENT: no sore throat, no nodules palpated in throat, no dysphagia/odynophagia, no hoarseness Cardiovascular: no Chest Pain, no Shortness of Breath, no palpitations, no leg swelling Respiratory: no cough, no shortness of breath Gastrointestinal: no Nausea/Vomiting/Diarhhea Musculoskeletal: no muscle/joint aches Skin: no rashes Neurological: no tremors, no  numbness, no tingling, no dizziness Psychiatric: no depression, no anxiety  Objective:       09/23/2021   10:25 AM 09/09/2021   11:47 AM 09/01/2021    1:04 PM  Vitals with BMI  Height '5\' 5"'$  '5\' 5"'$  '5\' 5"'$   Weight 161 lbs 159 lbs 13 oz 155 lbs  BMI 26.79 97.02 63.78  Systolic 588 502   Diastolic 58 83   Pulse 72 90     BP (!) 108/58   Pulse 72   Ht '5\' 5"'$  (1.651 m)   Wt 161 lb (73 kg)   BMI 26.79 kg/m   Wt Readings from Last 3 Encounters:  09/23/21 161 lb (73 kg)  09/09/21 159 lb 12.8 oz (72.5 kg)  09/01/21 155 lb (70.3 kg)    Physical Exam  Constitutional:  Body mass index is 26.79 kg/m.,  not in acute distress, normal state of mind, + walks with a cane. Eyes: PERRLA, EOMI, no exophthalmos ENT: moist mucous membranes, no gross thyromegaly, no gross cervical lymphadenopathy Cardiovascular: normal precordial activity, Regular Rate and Rhythm, no Murmur/Rubs/Gallops Respiratory:  adequate breathing efforts, no gross chest deformity, Clear to auscultation bilaterally Gastrointestinal: abdomen soft, Non -tender, No distension, Bowel Sounds present, no gross organomegaly Musculoskeletal: no gross deformities, strength intact in all four extremities Skin: moist, warm, no rashes Neurological: no tremor with outstretched hands, Deep tendon reflexes normal in bilateral lower extremities.  CMP ( most recent) CMP     Component Value Date/Time   NA 140 07/14/2021 1201   K 4.5 07/14/2021 1201   CL 105 07/14/2021 1201   CO2 20 07/14/2021 1201   GLUCOSE 83 07/14/2021 1201   GLUCOSE 90 06/13/2020 0513   BUN 15 07/14/2021 1201   CREATININE 0.97 07/14/2021 1201   CALCIUM 8.9 07/14/2021 1201   PROT 6.2 07/14/2021 1201   ALBUMIN 3.7 07/14/2021 1201   AST 20 07/14/2021 1201   ALT 11 07/14/2021 1201   ALKPHOS 76 07/14/2021 1201   BILITOT 0.4 07/14/2021 1201   GFRNONAA >60 06/13/2020 0513   GFRAA 74 04/22/2020 1125     Diabetic Labs (most recent): No results found for: "HGBA1C",  "MICROALBUR"   Lipid Panel ( most recent) Lipid Panel     Component Value Date/Time   CHOL 173 07/14/2021 1201   TRIG 69 07/14/2021 1201   TRIG 71 04/01/2015 0934   HDL 55 07/14/2021 1201   HDL 56 04/01/2015 0934   CHOLHDL 3.1 07/14/2021 1201   CHOLHDL 2.9 06/12/2015 0529   VLDL 9 06/12/2015 0529   LDLCALC 105 (H) 07/14/2021 1201   LDLCALC 121 (H) 08/29/2013 0909   LABVLDL 13 07/14/2021 1201      Lab Results  Component Value Date   TSH 1.260 07/14/2021   TSH 2.960 01/13/2021   TSH 1.58 07/14/2020   TSH 0.869 04/22/2020   TSH 0.70 01/14/2020   TSH 1.010 09/24/2019   TSH 1.160 05/03/2019   TSH 0.492 01/23/2019   TSH 5.680 (H) 09/19/2018   TSH 3.060 05/04/2018   FREET4 1.00 07/14/2020   FREET4 1.26 01/14/2020   FREET4 0.85 05/05/2017   FREET4 0.88 05/06/2016  FREET4 0.88 04/20/2013   FREET4 0.84 11/12/2012   FREET4 0.9 09/11/2008   FREET4 0.9 10/24/2007   FREET4 1.14 09/10/2007          Latest Reference Range & Units Most Recent 01/13/21 12:23 01/13/21 12:50 07/14/21 12:01  Prolactin 4.0 - 15.2 ng/mL 146.0 (H) 07/14/21 12:01  196.0 (H) 146.0 (H)  Glucose 70 - 99 mg/dL 83 07/14/21 12:01 84  83  TSH 0.450 - 4.500 uIU/mL 1.260 07/14/21 12:01 2.960  1.260  (H): Data is abnormally high   MRI of the brain without contrast on March 02, 2021  Redemonstration of pituitary lesion in the sella, left greater than right which measures approximately 1.4 x 2 x 1.5 cm.  Previously 1.3 x 1.9 x 1.6 cm. IMPRESSION: 1. Evaluation is somewhat limited by motion artifact and absence of intravenous contrast. Within this limitation, again noted is a pituitary lesion, which appears overall unchanged compared to 05/17/2018.   2.   No acute intracranial process.    Assessment & Plan:   1. PITUITARY ADENOMA 2. Hyperprolactinemia ([place])   - ABDULKADIR EMMANUEL  is being seen at a kind request of Dettinger, [name], MD. - I have reviewed his available  records and clinically  evaluated the patient. - Based on these reviews, he has pituitary adenoma/hyperprolactinemia.  Review of his medical records indicate that he is either not responsive or only partially responding to bromocriptine. I discussed and switched him to cabergoline 0.25 mg twice a week.  With plan to repeat prolactin in 3 months.  He is advised to discontinue his bromocriptine as soon as he gets his cabergoline prescription filled.  He does have pituitary microadenoma, however with no tissue shift nor pressure symptoms. He will not need surgical intervention at this time, he is not a suitable surgical candidate.     This patient will also benefit from screening bone density to screen for osteoporosis.  Fall precautions discussed with him.  He is encouraged to keep his cane and walk with his cane at all times.    His consult came in as a diabetes management, however his point-of-care A1c was 5.3% indicating absence of diabetes nor prediabetes.  Patient also says that he never was diagnosed with diabetes.  - he is advised to maintain close follow up with Dettinger, [name], MD for primary care needs.   - Time spent with the patient: 50 minutes, of which >50% was spent in  counseling him about his hyperprolactinemia, pituitary adenoma and the rest in obtaining information about his symptoms, reviewing his previous labs/studies ( including abstractions from other facilities),  evaluations, and treatments,  and developing a plan to confirm diagnosis and long term treatment based on the latest standards of care/guidelines; and documenting his care.  [name] participated in the discussions, expressed understanding, and voiced agreement with the above plans.  All questions were answered to his satisfaction. he is encouraged to contact clinic should he have any questions or concerns prior to his return visit.  Follow up plan: Return in about 3 months (around 12/24/2021) for F/U with Pre-visit Labs, DXA  Scan B4 NV.   Glade Lloyd, MD [facility] [address] [place], [place] [address] Phone: [phone]  Fax: [phone]     09/23/2021, 1:00 PM  This note was partially dictated with voice recognition software. Similar sounding words can be transcribed inadequately or may not  be corrected upon review.

## 2021-09-28 ENCOUNTER — Telehealth: Payer: Self-pay | Admitting: "Endocrinology

## 2021-09-28 NOTE — Telephone Encounter (Signed)
Patient is wondering if he should keep taking Romakriptin since he is now taking Cabergoline. Please advise

## 2021-09-29 NOTE — Telephone Encounter (Signed)
Discussed with pt, understanding voiced. 

## 2021-09-30 ENCOUNTER — Inpatient Hospital Stay (HOSPITAL_COMMUNITY)
Admission: EM | Admit: 2021-09-30 | Discharge: 2021-10-05 | DRG: 380 | Disposition: A | Discharge status: Home Health | Payer: Medicare Other | Attending: Internal Medicine | Admitting: Internal Medicine

## 2021-09-30 ENCOUNTER — Emergency Department (HOSPITAL_COMMUNITY): Payer: Medicare Other

## 2021-09-30 ENCOUNTER — Encounter (HOSPITAL_COMMUNITY): Payer: Self-pay

## 2021-09-30 ENCOUNTER — Telehealth: Payer: Self-pay | Admitting: Family Medicine

## 2021-09-30 ENCOUNTER — Other Ambulatory Visit: Payer: Self-pay

## 2021-09-30 DIAGNOSIS — D62 Acute posthemorrhagic anemia: Secondary | ICD-10-CM | POA: Diagnosis present

## 2021-09-30 DIAGNOSIS — K2100 Gastro-esophageal reflux disease with esophagitis, without bleeding: Secondary | ICD-10-CM | POA: Diagnosis present

## 2021-09-30 DIAGNOSIS — Z85828 Personal history of other malignant neoplasm of skin: Secondary | ICD-10-CM | POA: Diagnosis not present

## 2021-09-30 DIAGNOSIS — N400 Enlarged prostate without lower urinary tract symptoms: Secondary | ICD-10-CM | POA: Diagnosis present

## 2021-09-30 DIAGNOSIS — F32A Depression, unspecified: Secondary | ICD-10-CM | POA: Diagnosis present

## 2021-09-30 DIAGNOSIS — I480 Paroxysmal atrial fibrillation: Secondary | ICD-10-CM | POA: Diagnosis present

## 2021-09-30 DIAGNOSIS — Z880 Allergy status to penicillin: Secondary | ICD-10-CM

## 2021-09-30 DIAGNOSIS — G2581 Restless legs syndrome: Secondary | ICD-10-CM | POA: Diagnosis present

## 2021-09-30 DIAGNOSIS — K922 Gastrointestinal hemorrhage, unspecified: Secondary | ICD-10-CM | POA: Diagnosis not present

## 2021-09-30 DIAGNOSIS — D353 Benign neoplasm of craniopharyngeal duct: Secondary | ICD-10-CM

## 2021-09-30 DIAGNOSIS — I4891 Unspecified atrial fibrillation: Secondary | ICD-10-CM | POA: Diagnosis not present

## 2021-09-30 DIAGNOSIS — J479 Bronchiectasis, uncomplicated: Secondary | ICD-10-CM | POA: Diagnosis present

## 2021-09-30 DIAGNOSIS — R54 Age-related physical debility: Secondary | ICD-10-CM | POA: Diagnosis present

## 2021-09-30 DIAGNOSIS — J439 Emphysema, unspecified: Secondary | ICD-10-CM | POA: Diagnosis present

## 2021-09-30 DIAGNOSIS — Z955 Presence of coronary angioplasty implant and graft: Secondary | ICD-10-CM

## 2021-09-30 DIAGNOSIS — M1990 Unspecified osteoarthritis, unspecified site: Secondary | ICD-10-CM | POA: Diagnosis present

## 2021-09-30 DIAGNOSIS — D352 Benign neoplasm of pituitary gland: Secondary | ICD-10-CM | POA: Diagnosis present

## 2021-09-30 DIAGNOSIS — Z85820 Personal history of malignant melanoma of skin: Secondary | ICD-10-CM

## 2021-09-30 DIAGNOSIS — E039 Hypothyroidism, unspecified: Secondary | ICD-10-CM | POA: Diagnosis present

## 2021-09-30 DIAGNOSIS — R338 Other retention of urine: Secondary | ICD-10-CM | POA: Diagnosis present

## 2021-09-30 DIAGNOSIS — J690 Pneumonitis due to inhalation of food and vomit: Secondary | ICD-10-CM | POA: Diagnosis present

## 2021-09-30 DIAGNOSIS — K222 Esophageal obstruction: Secondary | ICD-10-CM | POA: Diagnosis present

## 2021-09-30 DIAGNOSIS — Z8249 Family history of ischemic heart disease and other diseases of the circulatory system: Secondary | ICD-10-CM | POA: Diagnosis not present

## 2021-09-30 DIAGNOSIS — Z8711 Personal history of peptic ulcer disease: Secondary | ICD-10-CM

## 2021-09-30 DIAGNOSIS — N401 Enlarged prostate with lower urinary tract symptoms: Secondary | ICD-10-CM | POA: Diagnosis present

## 2021-09-30 DIAGNOSIS — Z86010 Personal history of colonic polyps: Secondary | ICD-10-CM | POA: Diagnosis not present

## 2021-09-30 DIAGNOSIS — Z8719 Personal history of other diseases of the digestive system: Secondary | ICD-10-CM

## 2021-09-30 DIAGNOSIS — K2091 Esophagitis, unspecified with bleeding: Secondary | ICD-10-CM | POA: Diagnosis not present

## 2021-09-30 DIAGNOSIS — K2211 Ulcer of esophagus with bleeding: Secondary | ICD-10-CM | POA: Diagnosis present

## 2021-09-30 DIAGNOSIS — K2289 Other specified disease of esophagus: Secondary | ICD-10-CM | POA: Diagnosis not present

## 2021-09-30 DIAGNOSIS — R079 Chest pain, unspecified: Secondary | ICD-10-CM | POA: Diagnosis not present

## 2021-09-30 DIAGNOSIS — G629 Polyneuropathy, unspecified: Secondary | ICD-10-CM | POA: Diagnosis present

## 2021-09-30 DIAGNOSIS — J449 Chronic obstructive pulmonary disease, unspecified: Secondary | ICD-10-CM | POA: Diagnosis present

## 2021-09-30 DIAGNOSIS — I2510 Atherosclerotic heart disease of native coronary artery without angina pectoris: Secondary | ICD-10-CM | POA: Diagnosis present

## 2021-09-30 DIAGNOSIS — M810 Age-related osteoporosis without current pathological fracture: Secondary | ICD-10-CM | POA: Diagnosis present

## 2021-09-30 DIAGNOSIS — E785 Hyperlipidemia, unspecified: Secondary | ICD-10-CM | POA: Diagnosis present

## 2021-09-30 DIAGNOSIS — K449 Diaphragmatic hernia without obstruction or gangrene: Secondary | ICD-10-CM | POA: Diagnosis present

## 2021-09-30 DIAGNOSIS — Z96643 Presence of artificial hip joint, bilateral: Secondary | ICD-10-CM | POA: Diagnosis present

## 2021-09-30 DIAGNOSIS — F419 Anxiety disorder, unspecified: Secondary | ICD-10-CM | POA: Diagnosis present

## 2021-09-30 DIAGNOSIS — K5909 Other constipation: Secondary | ICD-10-CM | POA: Diagnosis present

## 2021-09-30 DIAGNOSIS — K219 Gastro-esophageal reflux disease without esophagitis: Secondary | ICD-10-CM | POA: Diagnosis present

## 2021-09-30 DIAGNOSIS — I7121 Aneurysm of the ascending aorta, without rupture: Secondary | ICD-10-CM | POA: Diagnosis present

## 2021-09-30 DIAGNOSIS — K920 Hematemesis: Secondary | ICD-10-CM | POA: Diagnosis present

## 2021-09-30 LAB — CBC
HCT: 36.4 % — ABNORMAL LOW (ref 39.0–52.0)
Hemoglobin: 11.6 g/dL — ABNORMAL LOW (ref 13.0–17.0)
MCH: 30 pg (ref 26.0–34.0)
MCHC: 31.9 g/dL (ref 30.0–36.0)
MCV: 94.1 fL (ref 80.0–100.0)
Platelets: 236 10*3/uL (ref 150–400)
RBC: 3.87 MIL/uL — ABNORMAL LOW (ref 4.22–5.81)
RDW: 15.8 % — ABNORMAL HIGH (ref 11.5–15.5)
WBC: 17.5 10*3/uL — ABNORMAL HIGH (ref 4.0–10.5)
nRBC: 0 % (ref 0.0–0.2)

## 2021-09-30 LAB — CBC WITH DIFFERENTIAL/PLATELET
Abs Immature Granulocytes: 0.3 10*3/uL — ABNORMAL HIGH (ref 0.00–0.07)
Basophils Absolute: 0.2 10*3/uL — ABNORMAL HIGH (ref 0.0–0.1)
Basophils Relative: 1 %
Eosinophils Absolute: 0.2 10*3/uL (ref 0.0–0.5)
Eosinophils Relative: 1 %
HCT: 37.6 % — ABNORMAL LOW (ref 39.0–52.0)
Hemoglobin: 12 g/dL — ABNORMAL LOW (ref 13.0–17.0)
Immature Granulocytes: 3 %
Lymphocytes Relative: 8 %
Lymphs Abs: 0.9 10*3/uL (ref 0.7–4.0)
MCH: 29.8 pg (ref 26.0–34.0)
MCHC: 31.9 g/dL (ref 30.0–36.0)
MCV: 93.3 fL (ref 80.0–100.0)
Monocytes Absolute: 1.5 10*3/uL — ABNORMAL HIGH (ref 0.1–1.0)
Monocytes Relative: 13 %
Neutro Abs: 8.8 10*3/uL — ABNORMAL HIGH (ref 1.7–7.7)
Neutrophils Relative %: 74 %
Platelets: 248 10*3/uL (ref 150–400)
RBC: 4.03 MIL/uL — ABNORMAL LOW (ref 4.22–5.81)
RDW: 15.9 % — ABNORMAL HIGH (ref 11.5–15.5)
WBC: 11.9 10*3/uL — ABNORMAL HIGH (ref 4.0–10.5)
nRBC: 0 % (ref 0.0–0.2)

## 2021-09-30 LAB — URINALYSIS, COMPLETE (UACMP) WITH MICROSCOPIC
Bilirubin Urine: NEGATIVE
Glucose, UA: NEGATIVE mg/dL
Ketones, ur: 20 mg/dL — AB
Leukocytes,Ua: NEGATIVE
Nitrite: NEGATIVE
Protein, ur: NEGATIVE mg/dL
RBC / HPF: >50 RBC/hpf — ABNORMAL HIGH (ref 0–5)
Specific Gravity, Urine: >1.046 — ABNORMAL HIGH (ref 1.005–1.030)
pH: 5 (ref 5.0–8.0)

## 2021-09-30 LAB — COMPREHENSIVE METABOLIC PANEL
ALT: 21 U/L (ref 0–44)
AST: 23 U/L (ref 15–41)
Albumin: 3.8 g/dL (ref 3.5–5.0)
Alkaline Phosphatase: 74 U/L (ref 38–126)
Anion gap: 6 (ref 5–15)
BUN: 40 mg/dL — ABNORMAL HIGH (ref 8–23)
CO2: 23 mmol/L (ref 22–32)
Calcium: 8.5 mg/dL — ABNORMAL LOW (ref 8.9–10.3)
Chloride: 108 mmol/L (ref 98–111)
Creatinine, Ser: 0.95 mg/dL (ref 0.61–1.24)
GFR, Estimated: >60 mL/min (ref >60)
Glucose, Bld: 133 mg/dL — ABNORMAL HIGH (ref 70–99)
Potassium: 3.8 mmol/L (ref 3.5–5.1)
Sodium: 137 mmol/L (ref 135–145)
Total Bilirubin: 0.5 mg/dL (ref 0.3–1.2)
Total Protein: 7 g/dL (ref 6.5–8.1)

## 2021-09-30 LAB — PROTIME-INR
INR: 1 (ref 0.8–1.2)
Prothrombin Time: 13.1 seconds (ref 11.4–15.2)

## 2021-09-30 LAB — TYPE AND SCREEN
ABO/RH(D): O POS
Antibody Screen: POSITIVE

## 2021-09-30 LAB — PROCALCITONIN: Procalcitonin: <0.1 ng/mL

## 2021-09-30 LAB — TROPONIN I (HIGH SENSITIVITY): Troponin I (High Sensitivity): 5 ng/L (ref <18)

## 2021-09-30 LAB — STREP PNEUMONIAE URINARY ANTIGEN: Strep Pneumo Urinary Antigen: NEGATIVE

## 2021-09-30 LAB — TSH: TSH: 2.622 u[IU]/mL (ref 0.350–4.500)

## 2021-09-30 LAB — LIPASE, BLOOD: Lipase: 35 U/L (ref 11–51)

## 2021-09-30 LAB — CK: Total CK: 85 U/L (ref 49–397)

## 2021-09-30 LAB — MAGNESIUM: Magnesium: 2 mg/dL (ref 1.7–2.4)

## 2021-09-30 LAB — PHOSPHORUS: Phosphorus: 2.4 mg/dL — ABNORMAL LOW (ref 2.5–4.6)

## 2021-09-30 LAB — CBG MONITORING, ED: Glucose-Capillary: 136 mg/dL — ABNORMAL HIGH (ref 70–99)

## 2021-09-30 MED ORDER — PANTOPRAZOLE SODIUM 40 MG IV SOLR
40.0000 mg | Freq: Once | INTRAVENOUS | Status: AC
  Administered 2021-09-30: 40 mg via INTRAVENOUS
  Filled 2021-09-30: qty 10

## 2021-09-30 MED ORDER — AMPICILLIN-SULBACTAM SODIUM 3 (2-1) G IJ SOLR
3.0000 g | Freq: Four times a day (QID) | INTRAMUSCULAR | Status: DC
  Administered 2021-09-30 – 2021-10-04 (×14): 3 g via INTRAVENOUS
  Filled 2021-09-30 (×16): qty 8

## 2021-09-30 MED ORDER — LEVOTHYROXINE SODIUM 75 MCG PO TABS
75.0000 ug | ORAL_TABLET | Freq: Every day | ORAL | Status: DC
Start: 2021-10-01 — End: 2021-10-05
  Administered 2021-10-03 – 2021-10-05 (×3): 75 ug via ORAL
  Filled 2021-09-30 (×3): qty 1

## 2021-09-30 MED ORDER — CABERGOLINE 0.5 MG PO TABS: 0.2500 mg | ORAL_TABLET | ORAL | Status: DC

## 2021-09-30 MED ORDER — ALBUTEROL SULFATE (2.5 MG/3ML) 0.083% IN NEBU
2.5000 mg | INHALATION_SOLUTION | RESPIRATORY_TRACT | Status: DC | PRN
  Administered 2021-10-02: 2.5 mg via RESPIRATORY_TRACT
  Filled 2021-09-30: qty 3

## 2021-09-30 MED ORDER — TAMSULOSIN HCL 0.4 MG PO CAPS
0.4000 mg | ORAL_CAPSULE | Freq: Every day | ORAL | Status: DC
  Administered 2021-10-03 – 2021-10-04 (×2): 0.4 mg via ORAL
  Filled 2021-09-30 (×2): qty 1

## 2021-09-30 MED ORDER — SODIUM CHLORIDE 0.9 % IV SOLN
250.0000 mg | Freq: Once | INTRAVENOUS | Status: AC
  Administered 2021-09-30: 250 mg via INTRAVENOUS
  Filled 2021-09-30: qty 5

## 2021-09-30 MED ORDER — ONDANSETRON HCL 4 MG/2ML IJ SOLN
4.0000 mg | Freq: Once | INTRAMUSCULAR | Status: AC
  Administered 2021-09-30: 4 mg via INTRAVENOUS
  Filled 2021-09-30: qty 2

## 2021-09-30 MED ORDER — IOHEXOL 350 MG/ML SOLN
100.0000 mL | Freq: Once | INTRAVENOUS | Status: AC | PRN
  Administered 2021-09-30: 100 mL via INTRAVENOUS

## 2021-09-30 MED ORDER — MORPHINE SULFATE (PF) 2 MG/ML IV SOLN
2.0000 mg | Freq: Once | INTRAVENOUS | Status: AC
  Administered 2021-09-30: 2 mg via INTRAVENOUS
  Filled 2021-09-30: qty 1

## 2021-09-30 MED ORDER — ACETAMINOPHEN 325 MG PO TABS: 650.0000 mg | ORAL_TABLET | Freq: Four times a day (QID) | ORAL | Status: DC | PRN

## 2021-09-30 MED ORDER — ACETAMINOPHEN 650 MG RE SUPP: 650.0000 mg | Freq: Four times a day (QID) | RECTAL | Status: DC | PRN

## 2021-09-30 MED ORDER — SODIUM CHLORIDE 0.9 % IV SOLN: INTRAVENOUS | Status: AC

## 2021-09-30 MED ORDER — SODIUM CHLORIDE 0.9 % IV SOLN: 3.0000 mg/kg | Freq: Once | INTRAVENOUS | Status: DC

## 2021-09-30 NOTE — Assessment & Plan Note (Signed)
-   Glasgow Blatchford score BUN >18.2 , Hg <13        - Admit to stepdown given ongoing hematemesis    - ER  Provider spoke to gastroenterology ( , [facility]) they will see patient in a.m. recommended Protonix drip and erythromycin appreciate their consult   - serial CBC.    - Monitor for any recurrence,  evidence of hemodynamic instability or significant blood loss  - Transfuse as needed for hemoglobin below 7 or evidence of life-threatening bleeding  - Establish at least 2 PIV and fluid resuscitate   - clear liquids for tonight keep nothing by mouth post midnight,   -  administer Protonix drip

## 2021-09-30 NOTE — Assessment & Plan Note (Signed)
Hold aspirin not on anticoagulation

## 2021-09-30 NOTE — Progress Notes (Signed)
Pharmacy Antibiotic Note  [name] is a 88 y.o. male admitted on 09/30/2021.  Pharmacy has been consulted for Unasyn dosing for aspiration pneumonia. Dr. Roel Cluck clarified the patient's PCN allergy:  states he got lightheaded after getting a shot, but has had amoxicilin since then.    Plan: Unasyn 3g IV q6h. Follow up renal function, culture results, and clinical course.   Height: '5\' 6"'$  (167.6 cm) Weight: 68.9 kg (152 lb) IBW/kg (Calculated) : 63.8  Temp (24hrs), Avg:98 F (36.7 C), Min:97.5 F (36.4 C), Max:98.5 F (36.9 C)  Recent Labs  Lab 09/30/21 1740 09/30/21 2103  WBC 11.9* 17.5*  CREATININE 0.95  --     Estimated Creatinine Clearance: 49.4 mL/min (by C-G formula based on SCr of 0.95 mg/dL).    Allergies  Allergen Reactions   Gadavist [Gadobutrol] Hives   Prednisone     Agitation and hallucinations   Betadine [Povidone Iodine] Other (See Comments)    Blisters    Lortab [Hydrocodone-Acetaminophen] Nausea And Vomiting   Penicillins Other (See Comments)    Lightheadedness  Has patient had a PCN reaction causing immediate rash, facial/tongue/throat swelling, SOB or lightheadedness with hypotension: Yes Has patient had a PCN reaction causing severe rash involving mucus membranes or skin necrosis: No Has patient had a PCN reaction that required hospitalization No Has patient had a PCN reaction occurring within the last 10 years: No If all of the above answers are "NO", then may proceed with Cephalosporin use.   Zetia [Ezetimibe] Other (See Comments)    Muscle weakness    Zocor [Simvastatin] Nausea Only and Other (See Comments)    Muscle weakness     Gretta Arab PharmD, BCPS Clinical Pharmacist [facility] [phone] 09/30/2021 9:45 PM

## 2021-09-30 NOTE — Assessment & Plan Note (Signed)
Continue Synthroid 75 mcg/day when able to restart p.o.

## 2021-09-30 NOTE — Assessment & Plan Note (Signed)
CTA showed no evidence of dissection

## 2021-09-30 NOTE — ED Provider Notes (Signed)
[facility] DEPT Provider Note   CSN: [id number] Arrival date & time: 09/30/21  1613     History Chief Complaint  Patient presents with   Hemoptysis    HPI [name] is a 88 y.o. male presenting for bright red blood.  Patient states that starting approximately 2 hours prior to arrival he started having bright red blood that he felt initially he was coughing up and now endorses vomiting.  He denies fevers chills nausea vomiting syncope shortness of breath.  He started having a coughing sensation and a nausea sensation.  No known sick contacts  Patient is ambulatory tolerating p.o. intake at this time.  He endorses mild subcostal chest pain over the past few days with worsening heartburn-like symptoms. Patient otherwise unable to provide any further medical context.  Patient's had a son at bedside. Patient's chart reveals that he has had complex Barrett's esophagus and multiple peptic ulcers over the past year requiring frequent EGD.  He missed a June EGD.  He is supposed be on omeprazole but is uncertain what medications he takes.  Patient's recorded medical, surgical, social, medication list and allergies were reviewed in the Snapshot window as part of the initial history.   Review of Systems   Review of Systems  Constitutional:  Negative for chills and fever.  HENT:  Negative for ear pain and sore throat.   Eyes:  Negative for pain and visual disturbance.  Respiratory:  Positive for cough. Negative for chest tightness and shortness of breath.   Cardiovascular:  Positive for chest pain. Negative for palpitations.  Gastrointestinal:  Positive for nausea and vomiting. Negative for abdominal pain.  Genitourinary:  Negative for dysuria and hematuria.  Musculoskeletal:  Negative for arthralgias and back pain.  Skin:  Negative for color change and rash.  Neurological:  Negative for seizures and syncope.  All other systems reviewed and are  negative.   Physical Exam Updated Vital Signs BP 104/62   Pulse 76   Temp (!) 97.3 F (36.3 C) (Oral)   Resp (!) 24   Ht '5\' 6"'$  (1.676 m)   Wt 68.9 kg   SpO2 93%   BMI 24.53 kg/m  Physical Exam Vitals and nursing note reviewed.  Constitutional:      General: He is not in acute distress.    Appearance: He is well-developed.  HENT:     Head: Normocephalic and atraumatic.  Eyes:     Conjunctiva/sclera: Conjunctivae normal.  Cardiovascular:     Rate and Rhythm: Normal rate and regular rhythm.     Heart sounds: No murmur heard. Pulmonary:     Effort: Pulmonary effort is normal. No respiratory distress.     Breath sounds: Normal breath sounds.  Abdominal:     Palpations: Abdomen is soft.     Tenderness: There is no abdominal tenderness.  Musculoskeletal:        General: No swelling.     Cervical back: Neck supple.  Skin:    General: Skin is warm and dry.     Capillary Refill: Capillary refill takes less than 2 seconds.  Neurological:     Mental Status: He is alert.  Psychiatric:        Mood and Affect: Mood normal.      ED Course/ Medical Decision Making/ A&P    Procedures .Critical Care  Performed by: Tretha Sciara, MD Authorized by: Tretha Sciara, MD   Critical care provider statement:    Critical care time (minutes):  34   Critical care was necessary to treat or prevent imminent or life-threatening deterioration of the following conditions:  Shock and circulatory failure   Critical care was time spent personally by me on the following activities:  Development of treatment plan with patient or surrogate, discussions with consultants, evaluation of patient's response to treatment, examination of patient, ordering and review of laboratory studies, ordering and review of radiographic studies, ordering and performing treatments and interventions, pulse oximetry, re-evaluation of patient's condition and review of old charts   Care discussed with: admitting  provider      Medications Ordered in ED Medications  cabergoline (DOSTINEX) tablet 0.25 mg (has no administration in time range)  levothyroxine (SYNTHROID) tablet 75 mcg (has no administration in time range)  tamsulosin (FLOMAX) capsule 0.4 mg (has no administration in time range)  0.9 %  sodium chloride infusion ( Intravenous New Bag/Given 09/30/21 2256)  acetaminophen (TYLENOL) tablet 650 mg (has no administration in time range)    Or  acetaminophen (TYLENOL) suppository 650 mg (has no administration in time range)  albuterol (PROVENTIL) (2.5 MG/3ML) 0.083% nebulizer solution 2.5 mg (has no administration in time range)  Ampicillin-Sulbactam (UNASYN) 3 g in sodium chloride 0.9 % 100 mL IVPB (3 g Intravenous New Bag/Given 09/30/21 2255)  pantoprazole (PROTONIX) injection 40 mg (40 mg Intravenous Given 09/30/21 1746)  ondansetron (ZOFRAN) injection 4 mg (4 mg Intravenous Given 09/30/21 1746)  erythromycin 250 mg in sodium chloride 0.9 % 100 mL IVPB (0 mg Intravenous Stopped 09/30/21 2133)  morphine (PF) 2 MG/ML injection 2 mg (2 mg Intravenous Given 09/30/21 1900)  ondansetron (ZOFRAN) injection 4 mg (4 mg Intravenous Given 09/30/21 1859)  pantoprazole (PROTONIX) injection 40 mg (40 mg Intravenous Given 09/30/21 1904)  iohexol (OMNIPAQUE) 350 MG/ML injection 100 mL (100 mLs Intravenous Contrast Given 09/30/21 1907)    Medical Decision Making:   [name] is a 88 y.o. male who presented to the ED today with hemoptysis becoming hematemesis detailed above.    Handoff received from EMS.  Additional history discussed with patient's family/caregivers.  Patient placed on continuous vitals and telemetry monitoring while in ED which was reviewed periodically.   Complete initial physical exam performed, notably the patient was HDS in NAD.       Reviewed and confirmed nursing documentation for past medical history, family history, social history.    Initial Assessment:   Patient's clinical presentation is  grossly complex on arrival.  He is hemodynamically stable, however he is having large-volume hematemesis.  Broad screening laboratory evaluation initiated without evidence of gross anemia nor coagulation dysfunction.  Gastroenterology was consulted and recommended reconsultation with subspecialty group [facility].  [place] was reached out to plans to evaluate patient tomorrow.  Patient was administered erythromycin, Protonix, Zofran for emergent treatment of his upper GI bleed.  Notably, during evaluation, patient started having acute chest pain.  He has a history of a thoracic aortic aneurysm.  CTA was performed demonstrating no acute pathology. Symptoms grossly improving on multiple frequent reassessments.  Given the presence of an upper GI bleed, I was called to bedside multiple times for close reassessment. Fortunately, patient remained hemodynamically stable despite his ongoing upper GI bleed.  Hospitalist was consulted to accept the patient in admission to continue to evaluate patient and continue for stabilization efforts prior to upper GI intervention.  Clinical Impression:  1. UGIB (upper gastrointestinal bleed)     Admit   Final Clinical Impression(s) / ED Diagnoses Final diagnoses:  UGIB (upper  gastrointestinal bleed)    Rx / DC Orders ED Discharge Orders     None         Tretha Sciara, MD 10/01/21 [phone]

## 2021-09-30 NOTE — ED Notes (Signed)
Pt to CT at this time.

## 2021-09-30 NOTE — ED Notes (Signed)
Pt c/o chest pain while vomiting, Dr Oswald Hillock in to evaluate and up date pt.

## 2021-09-30 NOTE — Assessment & Plan Note (Signed)
Not on statin 

## 2021-09-30 NOTE — Subjective & Objective (Signed)
Reports cough and clear sputum but also has been coughing a little bit of red blood  associated chest pain bowel had any vomiting Hemoptysis started 2 h ago at first thinking it was from his lungs but now started to have vomiting  Reports mild subcostal CP/ epigastric  Hx of  Barrett's esophagus and multiple peptic ulcers over the past year requiring frequent EGD. Was supposed to have one done in June but did not

## 2021-09-30 NOTE — ED Triage Notes (Signed)
Patient reports that he had a cough with clear sputum and then approx 2 hours ago he reports that he has been coughing up bright red blood.   Patient has a history of Barrett's esophagus

## 2021-09-30 NOTE — Assessment & Plan Note (Addendum)
Continue flomax At 0.4 mg p.o. daily Check urine residual as patient reports trouble urinating.  If evidence of urinary retention will place Foley

## 2021-09-30 NOTE — Assessment & Plan Note (Signed)
Followed by endocrinology as an outpatient

## 2021-09-30 NOTE — ED Notes (Signed)
Z

## 2021-09-30 NOTE — Assessment & Plan Note (Addendum)
Possible aspiration pneumonia Based on CT finding. Cover with Unasyn (have penicillin allergy listed after discussion patient felt slightly lightheaded when years ago got a shot of penicillin since then reports have had amoxicillin with no troubles seems to be not a true allergy discussed with patient and family who is willing to give Unasyn a try) Check procalcitonin obtain sputum cultures Patient with evidence of bronchiectasis could have chronic aspiration.  Order SLP consult Keep n.p.o. for now Check strep pneumo and Legionella

## 2021-09-30 NOTE — H&P (Signed)
[name] VQQ:[id number] DOB: [birth year] DOA: 09/30/2021     PCP: Dettinger, [name], MD   Outpatient Specialists:   CARDS: Dr. Percival Spanish   Pulmonary  Dr. Melvyn Novas  . GI Dr.   Hilarie Fredrickson ([facility]) and Dr. Corliss Marcus, MD  Patient arrived to ER on 09/30/21 at 1613 Referred by Attending Tretha Sciara, MD   Patient coming from:    home Lives alone,      Chief Complaint:   Chief Complaint  Patient presents with   Hemoptysis    HPI: [name] is a 88 y.o. male with medical history significant of GERD Berretts esophagus, pituitary microadenoma     Presented with   hemoptysis vs hematemesis Reports cough and clear sputum but also has been coughing a little bit of red blood  associated chest pain bowel had any vomiting Hemoptysis started 2 h ago at first thinking it was from his lungs but now started to have vomiting  Reports mild subcostal CP/ epigastric  Hx of  Barrett's esophagus and multiple peptic ulcers over the past year requiring frequent EGD. Was supposed to have one done in June but did not   No melena   Does not smoke  No etoh   Regarding pertinent Chronic problems:         CAD  - On Aspirin,                 -  followed by cardiology                 Hypothyroidism:  Lab Results  Component Value Date   TSH 1.260 07/14/2021   on synthroid     COPD -  followed by pulmonology   not  on baseline oxygen       A. Fib -  - CHA2DS2 vas score   3    Not on anticoagulation secondary to Risk of Falls,   recurrent bleeding           BPH - on Flomax,        While in ER:   Noted to have more of hematemesis and epigastric pain  Gi has been consulted plan for protonix drip erythromycin and EGD In AM     CTabd/pelvis - Increased distention and thick-walled appearance of the esophagus since prior study.   CTA chest - No evidence of aortic dissection Bronchiectasis with mucous plugging versus secretions in the lung bases as  well as peribronchial infiltrates  Following Medications were ordered in ER: Medications  erythromycin 250 mg in sodium chloride 0.9 % 100 mL IVPB (250 mg Intravenous New Bag/Given 09/30/21 1940)  pantoprazole (PROTONIX) injection 40 mg (40 mg Intravenous Given 09/30/21 1746)  ondansetron (ZOFRAN) injection 4 mg (4 mg Intravenous Given 09/30/21 1746)  morphine (PF) 2 MG/ML injection 2 mg (2 mg Intravenous Given 09/30/21 1900)  ondansetron (ZOFRAN) injection 4 mg (4 mg Intravenous Given 09/30/21 1859)  pantoprazole (PROTONIX) injection 40 mg (40 mg Intravenous Given 09/30/21 1904)  iohexol (OMNIPAQUE) 350 MG/ML injection 100 mL (100 mLs Intravenous Contrast Given 09/30/21 1907)    _______________________________________________________ ER Provider Called: [facility] gi   [facility] They Recommend admit to medicine on protonix drip  Continues to have hematemesis Will see in AM     ED Triage Vitals  Enc Vitals Group     BP 09/30/21 1638 134/85     Pulse Rate 09/30/21 1638 81     Resp 09/30/21 1638  16     Temp 09/30/21 1638 98.5 F (36.9 C)     Temp Source 09/30/21 1638 Oral     SpO2 09/30/21 1638 97 %     Weight 09/30/21 1641 152 lb (68.9 kg)     Height 09/30/21 1641 '5\' 6"'$  (1.676 m)     Head Circumference --      Peak Flow --      Pain Score 09/30/21 1640 5     Pain Loc --      Pain Edu? --      Excl. in [place]? --   TMAX(24)@     _________________________________________ Significant initial  Findings: Abnormal Labs Reviewed  CBC WITH DIFFERENTIAL/PLATELET - Abnormal; Notable for the following components:      Result Value   WBC 11.9 (*)    RBC 4.03 (*)    Hemoglobin 12.0 (*)    HCT 37.6 (*)    RDW 15.9 (*)    Neutro Abs 8.8 (*)    Monocytes Absolute 1.5 (*)    Basophils Absolute 0.2 (*)    Abs Immature Granulocytes 0.30 (*)    All other components within normal limits  COMPREHENSIVE METABOLIC PANEL - Abnormal; Notable for the following components:   Glucose, Bld 133 (*)    BUN 40 (*)    Calcium 8.5  (*)    All other components within normal limits  CBC - Abnormal; Notable for the following components:   WBC 17.5 (*)    RBC 3.87 (*)    Hemoglobin 11.6 (*)    HCT 36.4 (*)    RDW 15.8 (*)    All other components within normal limits  CBG MONITORING, ED - Abnormal; Notable for the following components:   Glucose-Capillary 136 (*)    All other components within normal limits     _________________________ Troponin  ordered ECG: Ordered Personally reviewed and interpreted by me showing: HR : 80 Rhythm:Sinus rhythm Right bundle branch block QTC 464    The recent clinical data is shown below. Vitals:   09/30/21 1900 09/30/21 1940 09/30/21 2000 09/30/21 2030  BP: (!) 147/101 (!) 140/72 123/65 118/75  Pulse: 82 86 71 94  Resp: (!) 24 19 (!) 21 20  Temp:   (!) 97.5 F (36.4 C)   TempSrc:      SpO2: 93% 93% 94% 96%  Weight:      Height:          WBC     Component Value Date/Time   WBC 11.9 (H) 09/30/2021 1740   LYMPHSABS 0.9 09/30/2021 1740   LYMPHSABS 1.4 09/09/2021 1212   MONOABS 1.5 (H) 09/30/2021 1740   EOSABS 0.2 09/30/2021 1740   EOSABS 0.1 09/09/2021 1212   BASOSABS 0.2 (H) 09/30/2021 1740   BASOSABS 0.3 (H) 09/09/2021 1212      _______________________________________________ Hospitalist was called for admission for Hematemesis   The following Work up has been ordered so far:  Orders Placed This Encounter  Procedures   DG Chest Portable 1 View   CT Angio Chest/Abd/Pel for Dissection W and/or W/WO   CBC with Differential   Comprehensive metabolic panel   Protime-INR   Lipase, blood   Consult to gastroenterology   Consult to hospitalist   CBG monitoring, ED   EKG 12-Lead   Type and screen [place] peripheral IV   Insert peripheral IV     OTHER Significant initial  Findings:  labs showing:  Recent Labs  Lab 09/30/21 1740  NA 137  K 3.8  CO2 23  GLUCOSE 133*  BUN 40*  CREATININE 0.95  CALCIUM 8.5*     Cr    stable,    Lab Results  Component Value Date   CREATININE 0.95 09/30/2021   CREATININE 0.97 07/14/2021   CREATININE 0.98 01/13/2021    Recent Labs  Lab 09/30/21 1740  AST 23  ALT 21  ALKPHOS 74  BILITOT 0.5  PROT 7.0  ALBUMIN 3.8   Lab Results  Component Value Date   CALCIUM 8.5 (L) 09/30/2021   PHOS 2.8 06/09/2020          Plt: Lab Results  Component Value Date   PLT 248 09/30/2021       Recent Labs  Lab 09/30/21 1740  WBC 11.9*  NEUTROABS 8.8*  HGB 12.0*  HCT 37.6*  MCV 93.3  PLT 248    HG/HCT   stable,      Component Value Date/Time   HGB 12.0 (L) 09/30/2021 1740   HGB 12.3 (L) 09/09/2021 1212   HCT 37.6 (L) 09/30/2021 1740   HCT 38.3 09/09/2021 1212   MCV 93.3 09/30/2021 1740   MCV 92 09/09/2021 1212     Recent Labs  Lab 09/30/21 1740  LIPASE 35    CBG (last 3)  Recent Labs    09/30/21 1701  GLUCAP 136*    Cultures:    Component Value Date/Time   SDES BRONCHIAL ALVEOLAR LAVAGE 02/01/2017 1300   SPECREQUEST NONE 02/01/2017 1300   CULT  02/01/2017 1300    Consistent with normal respiratory flora. Performed at [facility], [place] [address], [place], [place] [address]    REPTSTATUS 02/04/2017 FINAL 02/01/2017 1300     Radiological Exams on Admission: CT Angio Chest/Abd/Pel for Dissection W and/or W/WO  Result Date: 09/30/2021 CLINICAL DATA:  Acute aortic syndrome suspected. Cough with sputum and bright red blood. History of Barrett's esophagus. EXAM: CT ANGIOGRAPHY CHEST, ABDOMEN AND PELVIS TECHNIQUE: Non-contrast CT of the chest was initially obtained. Multidetector CT imaging through the chest, abdomen and pelvis was performed using the standard protocol during bolus administration of intravenous contrast. Multiplanar reconstructed images and MIPs were obtained and reviewed to evaluate the vascular anatomy. RADIATION DOSE REDUCTION: This exam was performed according to the departmental dose-optimization program which  includes automated exposure control, adjustment of the mA and/or kV according to patient size and/or use of iterative reconstruction technique. CONTRAST:  167m OMNIPAQUE IOHEXOL 350 MG/ML SOLN COMPARISON:  CT chest 06/09/2020.  CT abdomen and pelvis 10/05/2018 FINDINGS: CTA CHEST FINDINGS Cardiovascular: Noncontrast images of the chest demonstrate calcification in the aorta and coronary arteries. No evidence of intramural hematoma. Images obtained during the arterial phase after contrast material injection demonstrates an ascending aortic aneurysm measuring 4.2 cm in diameter, unchanged since prior study. No evidence of aortic dissection. Calcified and noncalcified plaque formation mostly in the descending aorta. Great vessel origins are patent. Central pulmonary arteries appear patent without evidence of significant pulmonary embolus. Normal heart size. No pericardial effusions. Mediastinum/Nodes: Thyroid gland is atrophic. No significant lymphadenopathy in the chest. The esophagus is diffusely distended and thick-walled with internal debris. This is likely related to the patient's history of esophagitis with Barretts esophagus. The degree of esophageal distention is increased since the prior study and the possibility of a developing obstructing lesion should be considered. Suggest follow-up with gastroenterology for consideration of endoscopy. Lungs/Pleura: Emphysematous changes in the lungs. Mild infiltration in  the lung bases. Bronchiectasis with opacification of lower lobe bronchi, possibly secretions or aspiration. Apical pleural calcifications are greater on the left, likely postinflammatory. No pleural effusions. Musculoskeletal: Old rib fractures. Compression of the T8 vertebral body is unchanged since prior study. Review of the MIP images confirms the above findings. CTA ABDOMEN AND PELVIS FINDINGS VASCULAR Aorta: Normal caliber aorta without aneurysm, dissection, vasculitis or significant stenosis.  Diffuse aortic calcification. Celiac: Celiac axis supplies the splenic artery and left gastric artery. Vessels appear patent without evidence of aneurysm, occlusion, or dissection. SMA: The hepatic artery arises from the superior mesenteric artery. Both vessels are patent without evidence of aneurysm, occlusion, or dissection. Renals: Both renal arteries are patent without evidence of aneurysm, dissection, vasculitis, fibromuscular dysplasia or significant stenosis. IMA: Patent without evidence of aneurysm, dissection, vasculitis or significant stenosis. Inflow: Patent without evidence of aneurysm, dissection, vasculitis or significant stenosis. Veins: No obvious venous abnormality within the limitations of this arterial phase study. Review of the MIP images confirms the above findings. NON-VASCULAR Hepatobiliary: No focal liver abnormality is seen. No gallstones, gallbladder wall thickening, or biliary dilatation. Pancreas: Unremarkable. No pancreatic ductal dilatation or surrounding inflammatory changes. Spleen: Normal in size without focal abnormality. Adrenals/Urinary Tract: Adrenal glands are unremarkable. Kidneys are normal, without renal calculi, focal lesion, or hydronephrosis. Bladder is unremarkable. Stomach/Bowel: Stomach, small bowel, and colon are not abnormally distended. Stool fills the colon. No wall thickening or inflammatory changes are appreciated. Appendix is normal. Lymphatic: No significant lymphadenopathy. Reproductive: Prostate gland is not enlarged. Visualization is limited due to streak artifact from hip prostheses. Other: No free air or free fluid in the abdomen. Abdominal wall musculature appears intact. Musculoskeletal: Bilateral total hip arthroplasties. Degenerative changes in the lumbar spine. Review of the MIP images confirms the above findings. IMPRESSION: 1. Diffuse aortic atherosclerosis. No evidence of aortic dissection. 2. 4.2 cm diameter ascending thoracic aortic aneurysm.  Recommend annual imaging followup by CTA or MRA. This recommendation follows 2010 ACCF/AHA/AATS/ACR/ASA/SCA/SCAI/SIR/STS/SVM Guidelines for the Diagnosis and Management of Patients with Thoracic Aortic Disease. Circulation. 2010; 121: X448-J856. Aortic aneurysm NOS (ICD10-I71.9) 3. Bronchiectasis with mucous plugging versus secretions in the lung bases as well as peribronchial infiltrates. Changes may represent aspiration or pneumonia. 4. Increased distention and thick-walled appearance of the esophagus since prior study. Changes likely related to history of Barrett's esophagus and esophagitis. Consider gastroenterology referral to exclude obstructing lesion. Electronically Signed   By: Lucienne Capers M.D.   On: 09/30/2021 19:40   DG Chest Portable 1 View  Result Date: 09/30/2021 CLINICAL DATA:  Cough, hemoptysis EXAM: PORTABLE CHEST 1 VIEW COMPARISON:  06/28/2021 FINDINGS: Cardiac size is within normal limits. There are no signs of pulmonary edema. Left hemidiaphragm is elevated. There is interval clearing of linear density in the lateral aspect of the right lower lung field. New linear densities are seen in medial right lower lung field. There is no significant pleural effusion or pneumothorax. There are multiple old healed fractures in the left ribs. IMPRESSION: Small new linear densities in the medial right lower lung fields suggest subsegmental atelectasis. There are no signs of pulmonary edema or focal pulmonary consolidation. Electronically Signed   By: Elmer Picker M.D.   On: 09/30/2021 18:31   _______________________________________________________________________________________________________ Latest  Blood pressure 118/75, pulse 94, temperature (!) 97.5 F (36.4 C), resp. rate 20, height '5\' 6"'$  (1.676 m), weight 68.9 kg, SpO2 96 %.   Vitals  labs and radiology finding personally reviewed  Review of Systems:    Pertinent  positives include:  hematemesis  Constitutional:  No weight  loss, night sweats, Fevers, chills, fatigue, weight loss  HEENT:  No headaches, Difficulty swallowing,Tooth/dental problems,Sore throat,  No sneezing, itching, ear ache, nasal congestion, post nasal drip,  Cardio-vascular:  No chest pain, Orthopnea, PND, anasarca, dizziness, palpitations.no Bilateral lower extremity swelling  GI:  No heartburn, indigestion, abdominal pain, nausea, vomiting, diarrhea, change in bowel habits, loss of appetite, melena, blood in stool,  Resp:  no shortness of breath at rest. No dyspnea on exertion, No excess mucus, no productive cough, No non-productive cough, No coughing up of blood.No change in color of mucus.No wheezing. Skin:  no rash or lesions. No jaundice GU:  no dysuria, change in color of urine, no urgency or frequency. No straining to urinate.  No flank pain.  Musculoskeletal:  No joint pain or no joint swelling. No decreased range of motion. No back pain.  Psych:  No change in mood or affect. No depression or anxiety. No memory loss.  Neuro: no localizing neurological complaints, no tingling, no weakness, no double vision, no gait abnormality, no slurred speech, no confusion  All systems reviewed and apart from [place] all are negative _______________________________________________________________________________________________ Past Medical History:   Past Medical History:  Diagnosis Date   Abnormality of gait 06/07/2013   Anemia    Aneurysm ([place])    on assending aorta, currently watching it, Dr Cyndia Bent   Anxiety    Arthritis    Asthma    Atrial fibrillation ([place])    B12 deficiency    Barrett's esophagus    CAD (coronary artery disease) 03/2006   3.0 x 20 mm TAXUS Perseus DES to the LAD; 01/2007  L main 30%, oLAD 50%, pLAD stent ok, CFX 80%, OM 60%, pRCA 60%, mRCA 70%, oPDA 90%; med rx    Cancer ([place])    skin cancer    Cataract    bilateral removal of cateracts   COLONIC POLYPS 07/17/2008   Qualifier: Diagnosis of  By: Lovette Cliche, CNA,  Christy     Complication of anesthesia     no issues,but pt prefers spinal due to Pulmonary problems   COPD (chronic obstructive pulmonary disease) ([place])    oxygen  on standby in home.   Depression    Diverticulosis    Emphysema    Enlarged prostate with urinary retention    Esophageal stenosis    Gastric ulcer    GERD (gastroesophageal reflux disease)    Hiatal hernia    Hypothyroidism    Neuropathy    Osteoporosis    Pituitary macroadenoma (HCC)    Restless legs syndrome (RLS)    Tubular adenoma of colon    UGIB (upper gastrointestinal bleed) 03/2013   EGD w/ large ulcer at GE junction      Past Surgical History:  Procedure Laterality Date   ABDOMINAL HERNIA REPAIR   2008   BIOPSY  06/13/2020   Procedure: BIOPSY;  Surgeon: Montez Morita, Quillian Quince, MD;  Location: [facility];  Service: Gastroenterology;;  gastric   CARDIAC CATHETERIZATION  01/2007   L main 30%, oLAD 50%,  pLAD stent ok, CFX 80%, OM 60%, pRCA 60%, mRCA 70%, oPDA 90%; med rx   cataract extraction both eyes     COLONOSCOPY     COLONOSCOPY WITH PROPOFOL N/A 06/13/2020   Procedure: COLONOSCOPY WITH PROPOFOL;  Surgeon: Harvel Quale, MD;  Location: [facility];  Service: Gastroenterology;  Laterality: N/A;   CORONARY ANGIOPLASTY     CORONARY STENT  PLACEMENT  03/2006   3.0 x 20 mm TAXUS Perseus DES to the LAD   CYSTOSCOPY N/A 06/10/2015   Procedure: CYSTOSCOPY FULGRATION OF BLEEDING,  electovapor resection;  Surgeon: Irine Seal, MD;  Location: [facility];  Service: Urology;  Laterality: N/A;   CYSTOSCOPY WITH INSERTION OF UROLIFT N/A 06/01/2015   Procedure: CYSTOSCOPY WITH INSERTION OF UROLIFT x4;  Surgeon: Irine Seal, MD;  Location: [facility];  Service: Urology;  Laterality: N/A;   ESOPHAGOGASTRODUODENOSCOPY N/A 04/20/2013   Procedure: ESOPHAGOGASTRODUODENOSCOPY (EGD);  Surgeon: Inda Castle, MD;  [facility];  Service: Endoscopy;  Laterality: N/A;   ESOPHAGOGASTRODUODENOSCOPY N/A 03/25/2016    Procedure: ESOPHAGOGASTRODUODENOSCOPY (EGD);  Surgeon: Irene Shipper, MD;  Location: [facility];  Service: Endoscopy;  Laterality: N/A;   ESOPHAGOGASTRODUODENOSCOPY (EGD) WITH PROPOFOL N/A 10/13/2015   Procedure: ESOPHAGOGASTRODUODENOSCOPY (EGD) WITH PROPOFOL;  Surgeon: Jerene Bears, MD;  Location: [facility];  Service: Gastroenterology;  Laterality: N/A;   ESOPHAGOGASTRODUODENOSCOPY (EGD) WITH PROPOFOL N/A 06/13/2020   Procedure: ESOPHAGOGASTRODUODENOSCOPY (EGD) WITH PROPOFOL;  Surgeon: Harvel Quale, MD;  [facility];  Service: Gastroenterology;  Laterality: N/A;   FOOT SURGERY  1994 left, 2002 right foot   bilateral foot reconstruciton   FOOT SURGERY     reconstruction of both feet- no retained hardware.   HERNIA REPAIR     HIATAL HERNIA REPAIR  01-04-2008   JOINT REPLACEMENT     MELANOMA EXCISION  2019   POLYPECTOMY     POLYPECTOMY  06/13/2020   Procedure: POLYPECTOMY;  Surgeon: Harvel Quale, MD;  [facility]: [facility];  Service: Gastroenterology;;  ascending,    PROSTATE SURGERY     x 2   SAVORY DILATION N/A 03/25/2016   Procedure: SAVORY DILATION;  Surgeon: Irene Shipper, MD;  Location: [facility];  Service: Endoscopy;  Laterality: N/A;   TOTAL HIP ARTHROPLASTY  07/21/2011   Procedure: TOTAL HIP ARTHROPLASTY ANTERIOR APPROACH;  Surgeon: Mauri Pole, MD;  Location: [facility];  Service: Orthopedics;  Laterality: Left;   TOTAL HIP ARTHROPLASTY Right 09/07/2012   Procedure: RIGHT TOTAL HIP ARTHROPLASTY ANTERIOR APPROACH;  Surgeon: Mcarthur Rossetti, MD;  Location: [facility];  Service: Orthopedics;  Laterality: Right;   TRANSURETHRAL RESECTION OF BLADDER NECK N/A 11/04/2015   Procedure: RESECTION OF BLADDER NECK;  Surgeon: Cleon Gustin, MD;  Location: [facility];  Service: Urology;  Laterality: N/A;   TRANSURETHRAL RESECTION OF PROSTATE N/A 11/04/2015   Procedure: TRANSURETHRAL RESECTION OF THE PROSTATE (TURP); REMOVAL OF UROLIFT IMPLANTS X THREE;   Surgeon: Cleon Gustin, MD;  Location: [facility];  Service: Urology;  Laterality: N/A;   UPPER GASTROINTESTINAL ENDOSCOPY  12/2013   Dr Hilarie Fredrickson, gastritis   VIDEO BRONCHOSCOPY Bilateral 02/01/2017   Procedure: VIDEO BRONCHOSCOPY WITHOUT FLUORO;  Surgeon: Tanda Rockers, MD;  Location: [facility];  Service: Cardiopulmonary;  Laterality: Bilateral;    Social History:  Ambulatory  cane,     reports that he has never smoked. He has never used smokeless tobacco. He reports that he does not drink alcohol and does not use drugs.    Family History:  Family History  Problem Relation Age of Onset   Heart disease Sister    Heart failure Father    Other Mother        phebitis related to Nayson's birth, he was 52 weeks old when she died   Colon cancer Neg Hx    Esophageal cancer Neg Hx    Rectal cancer Neg  Hx    Stomach cancer Neg Hx    Colon polyps Neg Hx    ______________________________________________________________________________________________ Allergies: Allergies  Allergen Reactions   Gadavist [Gadobutrol] Hives   Prednisone     Agitation and hallucinations   Betadine [Povidone Iodine] Other (See Comments)    Blisters    Lortab [Hydrocodone-Acetaminophen] Nausea And Vomiting   Penicillins Other (See Comments)    Lightheadedness  Has patient had a PCN reaction causing immediate rash, facial/tongue/throat swelling, SOB or lightheadedness with hypotension: Yes Has patient had a PCN reaction causing severe rash involving mucus membranes or skin necrosis: No Has patient had a PCN reaction that required hospitalization No Has patient had a PCN reaction occurring within the last 10 years: No If all of the above answers are "NO", then may proceed with Cephalosporin use.   Zetia [Ezetimibe] Other (See Comments)    Muscle weakness    Zocor [Simvastatin] Nausea Only and Other (See Comments)    Muscle weakness      Prior to Admission medications   Medication Sig Start Date End  Date Taking? Authorizing Provider  albuterol (VENTOLIN HFA) 108 (90 Base) MCG/ACT inhaler Inhale 2 puffs into the lungs every 6 (six) hours as needed for wheezing or shortness of breath. 07/14/21   Dettinger, [name], MD  Ascorbic Acid (VITAMIN C) 500 MG CAPS Take 1 capsule by mouth daily.    [provider]  aspirin EC 81 MG tablet Take 1 tablet (81 mg total) by mouth daily. Swallow whole. 05/20/20   Minus Breeding, MD  Budeson-Glycopyrrol-Formoterol (BREZTRI AEROSPHERE) 160-9-4.8 MCG/ACT AERO Inhale 2 puffs into the lungs 2 (two) times daily. 03/17/21   Dettinger, [name], MD  budesonide-formoterol (SYMBICORT) 160-4.5 MCG/ACT inhaler Inhale 2 puffs into the lungs in the morning and at bedtime. 03/05/21   Margaretha Seeds, MD  cabergoline (DOSTINEX) 0.5 MG tablet Take 0.5 tablets (0.25 mg total) by mouth 2 (two) times a week. 09/23/21   Cassandria Anger, MD  cholecalciferol (VITAMIN D) 1000 UNITS tablet Take 1,000 Units by mouth daily.     [provider]  diclofenac Sodium (VOLTAREN) 1 % GEL Apply 2 g topically 4 (four) times daily. 07/14/21   Dettinger, [name], MD  diphenhydramine-acetaminophen (TYLENOL PM) 25-500 MG TABS tablet Take 1 tablet by mouth at bedtime as needed.    [provider]  FEROSUL 325 (65 Fe) MG tablet TAKE (1) TABLET DAILY WITH BREAKFAST. 02/10/21   Dettinger, [name], MD  GAVILAX 17 GM/SCOOP powder DISSOLVE 17 GM SCOOP IN 8 OZ. OF WATER AND DRINK ONCE DAILY AS NEEDED FOR CONSTIPATION Patient taking differently: Take 17 g by mouth. Every 3-4 days 06/29/20   Jerene Bears, MD  levothyroxine (SYNTHROID) 75 MCG tablet Take 1 tablet daily. 06/25/21   Dettinger, [name], MD  magnesium oxide (MAG-OX) 400 (241.3 Mg) MG tablet Take 1 tablet (400 mg total) by mouth 2 (two) times daily. Patient taking differently: Take 400 mg by mouth as needed. 06/13/20   Cristal Deer, MD  naphazoline-pheniramine (NAPHCON-A) 0.025-0.3 % ophthalmic solution Place 1 drop  into the left eye 2 (two) times daily as needed for irritation or allergies.    [provider]  nitroGLYCERIN (NITROSTAT) 0.4 MG SL tablet Place 1 tablet (0.4 mg total) under the tongue every 5 (five) minutes as needed for chest pain. 07/14/21   Dettinger, [name], MD  pantoprazole (PROTONIX) 40 MG tablet TAKE ONE TABLET TWICE DAILY 01/11/21   Pyrtle, Lajuan Lines,  MD  psyllium (METAMUCIL) 28 % packet Take 1 packet by mouth daily. 06/07/21   Gwenlyn Perking, FNP  Respiratory Therapy Supplies (FLUTTER) DEVI Twice a day and prn as needed, may increase if feeling worse 05/01/18   Martyn Ehrich, NP  sodium chloride HYPERTONIC 3 % nebulizer solution Take by nebulization as needed for other. Patient taking differently: Take 4 mLs by nebulization daily. 06/18/20   Margaretha Seeds, MD  sucralfate (CARAFATE) 1 g tablet Take 1 tablet three times daily as needed 11/03/20   Pyrtle, Lajuan Lines, MD  tamsulosin (FLOMAX) 0.4 MG CAPS capsule Take 1 capsule (0.4 mg total) by mouth daily after supper. 07/14/21   Dettinger, [name], MD  traMADol (ULTRAM) 50 MG tablet Take 1 tablet (50 mg total) by mouth 2 (two) times daily as needed. 09/01/21   Aundra Dubin, PA-C  triamcinolone cream (KENALOG) 0.1 % Apply 1 application topically 2 (two) times daily. Patient taking differently: Apply 1 application  topically 2 (two) times daily as needed (itching). 10/31/18   Baruch Gouty, FNP  vitamin B-12 (CYANOCOBALAMIN) 1000 MCG tablet Take 1,000 mcg by mouth daily.    [provider]  vitamin E 180 MG (400 UNITS) capsule Take 400 Units by mouth daily.    [provider]    ___________________________________________________________________________________________________ Physical Exam:    09/30/2021    8:30 PM 09/30/2021    8:00 PM 09/30/2021    7:40 PM  Vitals with BMI  Systolic [phone]  Diastolic 75 65 72  Pulse 94 71 86     1. General:  in No  Acute distress   Chronically ill  -appearing 2.  Psychological: Alert and   Oriented 3. Head/ENT:  Dry Mucous Membranes                          Head Non traumatic, neck supple                           Poor Dentition 4. SKIN:  decreased Skin turgor,  Skin clean Dry and intact no rash 5. Heart: Regular rate and rhythm no  Murmur, no Rub or gallop 6. Lungs: occasional  wheezes or crackles   7. Abdomen: Soft,  non-tender, Non distended bowel sounds present 8. Lower extremities: no clubbing, cyanosis, no  edema 9. Neurologically Grossly intact, moving all 4 extremities equally   10. MSK: Normal range of motion    Chart has been reviewed  ______________________________________________________________________________________________  Assessment/Plan 88 y.o. male with medical history significant of GERD Berretts esophagus, pituitary microadenoma    Admitted for hematemesis upper GI bleed  Present on Admission:  Upper GI bleed  Afib (HCC)  BPH (benign prostatic hyperplasia)  COPD (chronic obstructive pulmonary disease) (HCC)  Hyperlipidemia LDL goal <70  Hypothyroidism  Thoracic ascending aortic aneurysm (HCC)  PITUITARY ADENOMA  Aspiration pneumonia (HCC)     Afib (HCC) Hold aspirin not on anticoagulation   BPH (benign prostatic hyperplasia) Continue flomax At 0.4 mg p.o. daily Check urine residual as patient reports trouble urinating.  If evidence of urinary retention will place Foley  COPD (chronic obstructive pulmonary disease) (HCC) chronic stable con home meds  Hyperlipidemia LDL goal <70 Not on statin  Hypothyroidism Continue Synthroid 75 mcg/day when able to restart p.o.  Thoracic ascending aortic aneurysm (HCC) CTA showed no evidence of dissection  PITUITARY ADENOMA Followed by endocrinology as an  outpatient  Upper GI bleed  - Glasgow Blatchford score BUN >18.2 , Hg <13        - Admit to stepdown given ongoing hematemesis    - ER  Provider spoke to gastroenterology ( , [facility]) they will see patient in a.m.  recommended Protonix drip and erythromycin appreciate their consult   - serial CBC.    - Monitor for any recurrence,  evidence of hemodynamic instability or significant blood loss  - Transfuse as needed for hemoglobin below 7 or evidence of life-threatening bleeding  - Establish at least 2 PIV and fluid resuscitate   - clear liquids for tonight keep nothing by mouth post midnight,   -  administer Protonix drip       Aspiration pneumonia ([place]) Possible aspiration pneumonia Based on CT finding. Cover with Unasyn (have penicillin allergy listed after discussion patient felt slightly lightheaded when years ago got a shot of penicillin since then reports have had amoxicillin with no troubles seems to be not a true allergy discussed with patient and family who is willing to give Unasyn a try) Check procalcitonin obtain sputum cultures Patient with evidence of bronchiectasis could have chronic aspiration.  Order SLP consult Keep n.p.o. for now Check strep pneumo and Legionella   Other plan as per orders.  DVT prophylaxis:  SCD        Code Status:    Code Status: Prior FULL CODE  as per patient    I had personally discussed CODE STATUS with patient and family     Family Communication:   Family   at  Bedside  plan of care was discussed   with   Son,    Disposition Plan:      To home once workup is complete and patient is stable   Following barriers for discharge:                                                        Anemiastable                                                      Will need consultants to evaluate patient prior to discharge                       Would benefit from PT/OT eval prior to DC  Ordered                                      Consults called: [facility] is aware  Admission status:  ED Disposition     ED Disposition  Admit   Condition  --   [facility]oster: [facility] [[id number]]  Level of Care: Stepdown [14]  Admit to SDU based  on following criteria: Other see comments  Comments: hematemesis  May admit patient to [facility] or [facility] if equivalent level of care is available:: No  Covid Evaluation: Asymptomatic - no recent exposure (last 10 days) testing not required  Diagnosis: Hematemesis [578.0.ICD-9-CM]  Admitting Physician: Toy Baker [[id number]]  Attending Physician: Toy Baker [[id number]]  Certification:: I certify this patient will need inpatient services for at least 2 midnights  Estimated Length of Stay: 2            inpatient     I Expect 2 midnight stay secondary to severity of patient's current illness need for inpatient interventions justified by the following:  Severe lab/radiological/exam abnormalities including:    Hematemesis  and extensive comorbidities including:  CAD  COPD/asthma   That are currently affecting medical management.   I expect  patient to be hospitalized for 2 midnights requiring inpatient medical care.  Patient is at high risk for adverse outcome (such as loss of life or disability) if not treated.  Indication for inpatient stay as follows:    inability to maintain oral hydration    Need for operative/procedural  intervention    Need for   IV fluids, IV PPI    Level of care    step down  tele indefinitely please discontinue once patient no longer qualifies COVID-19 Labs      Jennell Janosik 09/30/2021, 9:48 PM    [facility]     after 2 AM please page floor coverage PA If 7AM-7PM, please contact the day team taking care of the patient using [email or web address]   Patient was evaluated in the context of the global COVID-19 pandemic, which necessitated consideration that the patient might be at risk for infection with the SARS-CoV-2 virus that causes COVID-19. Institutional protocols and algorithms that pertain to the evaluation of patients at risk for COVID-19 are in a state of rapid change based on information released by regulatory bodies  including the CDC and federal and state organizations. These policies and algorithms were followed during the patient's care.

## 2021-09-30 NOTE — Assessment & Plan Note (Signed)
chronic stable con home meds

## 2021-10-01 ENCOUNTER — Inpatient Hospital Stay (HOSPITAL_COMMUNITY): Payer: Medicare Other

## 2021-10-01 DIAGNOSIS — I4891 Unspecified atrial fibrillation: Secondary | ICD-10-CM

## 2021-10-01 DIAGNOSIS — D62 Acute posthemorrhagic anemia: Secondary | ICD-10-CM | POA: Diagnosis not present

## 2021-10-01 DIAGNOSIS — R079 Chest pain, unspecified: Secondary | ICD-10-CM | POA: Diagnosis not present

## 2021-10-01 DIAGNOSIS — J449 Chronic obstructive pulmonary disease, unspecified: Secondary | ICD-10-CM | POA: Diagnosis not present

## 2021-10-01 DIAGNOSIS — J690 Pneumonitis due to inhalation of food and vomit: Secondary | ICD-10-CM | POA: Diagnosis not present

## 2021-10-01 DIAGNOSIS — K922 Gastrointestinal hemorrhage, unspecified: Secondary | ICD-10-CM | POA: Diagnosis not present

## 2021-10-01 LAB — COMPREHENSIVE METABOLIC PANEL
ALT: 16 U/L (ref 0–44)
AST: 18 U/L (ref 15–41)
Albumin: 2.9 g/dL — ABNORMAL LOW (ref 3.5–5.0)
Alkaline Phosphatase: 55 U/L (ref 38–126)
Anion gap: 5 (ref 5–15)
BUN: 28 mg/dL — ABNORMAL HIGH (ref 8–23)
CO2: 23 mmol/L (ref 22–32)
Calcium: 7.7 mg/dL — ABNORMAL LOW (ref 8.9–10.3)
Chloride: 113 mmol/L — ABNORMAL HIGH (ref 98–111)
Creatinine, Ser: 0.93 mg/dL (ref 0.61–1.24)
GFR, Estimated: >60 mL/min (ref >60)
Glucose, Bld: 116 mg/dL — ABNORMAL HIGH (ref 70–99)
Potassium: 4 mmol/L (ref 3.5–5.1)
Sodium: 141 mmol/L (ref 135–145)
Total Bilirubin: 0.6 mg/dL (ref 0.3–1.2)
Total Protein: 5.4 g/dL — ABNORMAL LOW (ref 6.5–8.1)

## 2021-10-01 LAB — EXPECTORATED SPUTUM ASSESSMENT W GRAM STAIN, RFLX TO RESP C

## 2021-10-01 LAB — CBC
HCT: 29.9 % — ABNORMAL LOW (ref 39.0–52.0)
HCT: 29 % — ABNORMAL LOW (ref 39.0–52.0)
Hemoglobin: 9.6 g/dL — ABNORMAL LOW (ref 13.0–17.0)
Hemoglobin: 9.4 g/dL — ABNORMAL LOW (ref 13.0–17.0)
MCH: 30.5 pg (ref 26.0–34.0)
MCH: 30.6 pg (ref 26.0–34.0)
MCHC: 32.1 g/dL (ref 30.0–36.0)
MCHC: 32.4 g/dL (ref 30.0–36.0)
MCV: 94.9 fL (ref 80.0–100.0)
MCV: 94.5 fL (ref 80.0–100.0)
Platelets: 195 10*3/uL (ref 150–400)
Platelets: 184 10*3/uL (ref 150–400)
RBC: 3.15 MIL/uL — ABNORMAL LOW (ref 4.22–5.81)
RBC: 3.07 MIL/uL — ABNORMAL LOW (ref 4.22–5.81)
RDW: 16.1 % — ABNORMAL HIGH (ref 11.5–15.5)
RDW: 16.2 % — ABNORMAL HIGH (ref 11.5–15.5)
WBC: 15 10*3/uL — ABNORMAL HIGH (ref 4.0–10.5)
WBC: 14.2 10*3/uL — ABNORMAL HIGH (ref 4.0–10.5)
nRBC: 0 % (ref 0.0–0.2)
nRBC: 0 % (ref 0.0–0.2)

## 2021-10-01 LAB — MRSA NEXT GEN BY PCR, NASAL: MRSA by PCR Next Gen: NOT DETECTED

## 2021-10-01 LAB — HEMOGLOBIN AND HEMATOCRIT, BLOOD
HCT: 29.1 % — ABNORMAL LOW (ref 39.0–52.0)
Hemoglobin: 9.2 g/dL — ABNORMAL LOW (ref 13.0–17.0)

## 2021-10-01 LAB — ECHOCARDIOGRAM COMPLETE
Area-P 1/2: 3.17 cm2
Height: 67 in
S' Lateral: 2 cm
Weight: 2490.32 oz

## 2021-10-01 LAB — PREALBUMIN: Prealbumin: 18 mg/dL (ref 18–38)

## 2021-10-01 LAB — PROCALCITONIN: Procalcitonin: <0.1 ng/mL

## 2021-10-01 MED ORDER — BUDESON-GLYCOPYRROL-FORMOTEROL 160-9-4.8 MCG/ACT IN AERO: 2.0000 | INHALATION_SPRAY | Freq: Two times a day (BID) | RESPIRATORY_TRACT | Status: DC

## 2021-10-01 MED ORDER — FLUTICASONE FUROATE-VILANTEROL 200-25 MCG/ACT IN AEPB
1.0000 | INHALATION_SPRAY | Freq: Every day | RESPIRATORY_TRACT | Status: DC
  Administered 2021-10-01 – 2021-10-05 (×5): 1 via RESPIRATORY_TRACT
  Filled 2021-10-01: qty 28

## 2021-10-01 MED ORDER — PANTOPRAZOLE 80MG IVPB - SIMPLE MED
80.0000 mg | Freq: Once | INTRAVENOUS | Status: AC
  Administered 2021-10-01: 80 mg via INTRAVENOUS
  Filled 2021-10-01: qty 80

## 2021-10-01 MED ORDER — POTASSIUM PHOSPHATES 15 MMOLE/5ML IV SOLN
15.0000 mmol | Freq: Once | INTRAVENOUS | Status: AC
  Administered 2021-10-01: 15 mmol via INTRAVENOUS
  Filled 2021-10-01: qty 5

## 2021-10-01 MED ORDER — CHLORHEXIDINE GLUCONATE CLOTH 2 % EX PADS
6.0000 | MEDICATED_PAD | Freq: Every day | CUTANEOUS | Status: DC
  Administered 2021-10-02 – 2021-10-04 (×3): 6 via TOPICAL

## 2021-10-01 MED ORDER — ORAL CARE MOUTH RINSE
15.0000 mL | OROMUCOSAL | Status: DC | PRN
Start: 2021-10-01 — End: 2021-10-05

## 2021-10-01 MED ORDER — UMECLIDINIUM BROMIDE 62.5 MCG/ACT IN AEPB
1.0000 | INHALATION_SPRAY | Freq: Every day | RESPIRATORY_TRACT | Status: DC
  Administered 2021-10-01 – 2021-10-05 (×5): 1 via RESPIRATORY_TRACT
  Filled 2021-10-01: qty 7

## 2021-10-01 MED ORDER — PANTOPRAZOLE SODIUM 40 MG IV SOLR: 40.0000 mg | Freq: Two times a day (BID) | INTRAVENOUS | Status: DC

## 2021-10-01 MED ORDER — SODIUM CHLORIDE 0.9 % IV SOLN
INTRAVENOUS | Status: AC
Start: 2021-10-01 — End: 2021-10-02

## 2021-10-01 MED ORDER — PANTOPRAZOLE INFUSION (NEW) - SIMPLE MED
8.0000 mg/h | INTRAVENOUS | Status: DC
  Administered 2021-10-01 – 2021-10-04 (×8): 8 mg/h via INTRAVENOUS
  Filled 2021-10-01: qty 80
  Filled 2021-10-01 (×2): qty 100
  Filled 2021-10-01: qty 80
  Filled 2021-10-01: qty 100
  Filled 2021-10-01 (×3): qty 80
  Filled 2021-10-01: qty 100
  Filled 2021-10-01: qty 80
  Filled 2021-10-01: qty 100

## 2021-10-01 NOTE — H&P (View-Only) (Signed)
Consultation  Referring Provider:   [facility] Primary Care Physician:  Dettinger, [name], MD Primary Gastroenterologist:  Dr. Hilarie Fredrickson       Reason for Consultation:     Anemia   Impression    Upper GI bleed with history 2022 with chronic GERD/Barrett's esophagus is not been taking Protonix twice daily, not been taking Carafate if he takes the Protonix. EGD 06/12/2020 in [place] for anemia showed grade D esophagitis above GE junction without bleeding, no stenosis, 2 cm hiatal hernia, gastritis.  Normal duodenum-no repeat  Acute dysphagia Patient was able to eat solid foods yesterday, currently having cough/regurgitation of even liquids. Patient does take steroid inhalers, no odynophagia but cannot rule out infectious esophagitis. Currently pending speech evaluation with modified barium swallow which I agree with Could be worsening esophagitis/obstruction.  Aspiration pneumonia in setting of bronchiectasis/COPD Being treated with Unasyn IV first dose 08/03 On room air Higher risk for endoscopic procedures  Prolactinoma Follows Dr. Loanne Drilling is on bromocriptine  Atrial fibrillation Not on anticoagulation other than low-dose aspirin every other day.    Plan   88 year old male with history of upper GI bleed 2022 previous chronic GERD/Barrett's esophagus/peptic ulcers disease.  Not taking PPI/Carafate as supposed to presents with 2.5 g drop in hemoglobin with aspiration pneumonia and regurgitation/hemoptysis.   No melena or hematochezia.     Patient is very high risk for endoscopic evaluation with bronchiectasis and current aspiration pneumonia, last endoscopy 05/2020 showed grade D esophagitis above GE junction no stenosis hiatal hernia and gastritis. Unknown if acute dysphagia is from oropharynx, MBS ordered.  Agree with this At this time patient states cannot take anything by mouth other than ice chips, continue infusion with subsequent IV Protonix injection every 12  hours. Can potentially do trial of GI cocktail or liquid Carafate but worried about aspiration risk. It is something is best if we optimize medications, continue with MBS, timing of endoscopy depends on availability of anesthesia and recommendations per Dr. Candis Schatz   Thank you for your kind consultation, we will continue to follow.         HPI:   [name] is a 88 y.o. male with past medical history significant for chronic constipation, bronchiectasis, A-fib, ascending aortic aneurysm, prolactinoma, history of GERD with esophagitis, Barrett's esophagus, history of esophageal stricture status post dilatation, history of hiatal hernia repair with recurrence presents for upper GI bleed.  Patient had similar presentation of severe anemia 06/12/2020 in [place].  Had EGD colonoscopy at that time.  EGD showed grade D esophagitis above GE junction without bleeding, no stenosis, 2 cm hiatal hernia, gastritis.  Normal duodenum Colonoscopy scheduled for metal stool throughout the colon, tortuous colon especially right side, 2 mm polyp ascending colon, diet to [place] descending and sigmoid colon. Placed on pantoprazole twice daily with Carafate, due to patient's COPD and bronchiectasis repeat upper endoscopy was not pursued due to clinical improvement  Patient presented to ER with hemoglobin 12, currently at 9.6.  Normocytic with MCV 93.3.  Patient did have leukocytosis 11.9 on admission currently at 15.  Normal platelets 195. Procalcitonin unremarkable Pending respiratory culture Legionella  Negative troponins, negative lipase INR unremarkable CTA in the ER showed esophageal thickening, possible aspiration or pneumonia.  Calcium 7.7 BUN on admission elevated at 40, with normal creatinine 0.95 currently at 28 with creatinine 0.93. Patient started on Protonix drip, given erythromycin. Currently afebrile, blood pressures stable, has some tachypnea, 93% on room air.  Patient is  in the room with  his kids, who also gives some of the history. Patient states yesterday was not feeling well around noon.  Picked up [place] with a friend ate this without any difficulties and then around 2 PM began to cough up bright red blood.  Patient's been having epigastric pain without radiation, denies nausea. Patient has dyspnea with exertion especially steps but this is unchanged.  Denies fever or chills. Since episode of hemoptysis patient's been having cough/regurgitation with any liquids or food he tries to eat.  Denies any dysphagia prior to this no odynophagia, no pharyngeal dysphagia prior the patient was admitted with likely aspiration pneumonia, currently being treated with Unasyn. Patient states he does have reflux daily especially every time he eats.  Supposed to be on Protonix twice daily but he only takes it in the morning and every other night.  When he does not take the Protonix at night he will take a Carafate but will not take them at the same time. Patient complains of constipation last bowel movement was 2 to 3 days ago, denies any melena or hematochezia. Patient is on low-dose aspirin every other day denies any NSAID use or alcohol.    Abnormal ED labs:   Past Medical History:  Diagnosis Date   Abnormality of gait 06/07/2013   Anemia    Aneurysm ([place])    on assending aorta, currently watching it, Dr Cyndia Bent   Anxiety    Arthritis    Asthma    Atrial fibrillation ([place])    B12 deficiency    Barrett's esophagus    CAD (coronary artery disease) 03/2006   3.0 x 20 mm TAXUS [place] DES to the LAD; 01/2007  L main 30%, oLAD 50%, pLAD stent ok, CFX 80%, OM 60%, pRCA 60%, mRCA 70%, oPDA 90%; med rx    Cancer ([place])    skin cancer    Cataract    bilateral removal of cateracts   COLONIC POLYPS 07/17/2008   Qualifier: Diagnosis of  By: Lovette Cliche, CNA, Christy     Complication of anesthesia     no issues,but pt prefers spinal due to Pulmonary problems   COPD (chronic obstructive pulmonary  disease) ([place])    oxygen  on standby in home.   Depression    Diverticulosis    Emphysema    Enlarged prostate with urinary retention    Esophageal stenosis    Gastric ulcer    GERD (gastroesophageal reflux disease)    Hiatal hernia    Hypothyroidism    Neuropathy    Osteoporosis    Pituitary macroadenoma (HCC)    Restless legs syndrome (RLS)    Tubular adenoma of colon    UGIB (upper gastrointestinal bleed) 03/2013   EGD w/ large ulcer at GE junction    Surgical History:  He  has a past surgical history that includes Coronary stent placement (03/2006); Hiatal hernia repair (01-04-2008); Foot surgery (1994 left, 2002 right foot); Abdominal hernia repair ( 2008); cataract extraction both eyes; Total hip arthroplasty (07/21/2011); Cardiac catheterization (01/2007); Total hip arthroplasty (Right, 09/07/2012); Foot surgery; Esophagogastroduodenoscopy (N/A, 04/20/2013); Colonoscopy; Upper gastrointestinal endoscopy (12/2013); Cystoscopy with insertion of urolift (N/A, 06/01/2015); Cystoscopy (N/A, 06/10/2015); Prostate surgery; Esophagogastroduodenoscopy (egd) with propofol (N/A, 10/13/2015); Transurethral resection of prostate (N/A, 11/04/2015); Transurethral resection of bladder neck (N/A, 11/04/2015); Esophagogastroduodenoscopy (N/A, 03/25/2016); Savory dilation (N/A, 03/25/2016); Video bronchoscopy (Bilateral, 02/01/2017); Melanoma excision (2019); Polypectomy; Hernia repair; Joint replacement; Coronary angioplasty; Esophagogastroduodenoscopy (egd) with propofol (N/A, 06/13/2020); Colonoscopy with propofol (N/A, 06/13/2020);  biopsy (06/13/2020); and polypectomy (06/13/2020). Family History:  His family history includes Heart disease in his sister; Heart failure in his father; Other in his mother. Social History:   reports that he has never smoked. He has never used smokeless tobacco. He reports that he does not drink alcohol and does not use drugs.  Prior to Admission medications   Medication Sig Start Date  End Date Taking? Authorizing Provider  albuterol (VENTOLIN HFA) 108 (90 Base) MCG/ACT inhaler Inhale 2 puffs into the lungs every 6 (six) hours as needed for wheezing or shortness of breath. 07/14/21  Yes Dettinger, [name], MD  Ascorbic Acid (VITAMIN C) 500 MG CAPS Take 1 capsule by mouth daily.   Yes [provider]  aspirin EC 81 MG tablet Take 1 tablet (81 mg total) by mouth daily. Swallow whole. Patient taking differently: Take 81 mg by mouth every other day. Swallow whole. 05/20/20  Yes Minus Breeding, MD  budesonide-formoterol [facility]) 160-4.5 MCG/ACT inhaler Inhale 2 puffs into the lungs in the morning and at bedtime. 03/05/21  Yes Margaretha Seeds, MD  cabergoline (DOSTINEX) 0.5 MG tablet Take 0.5 tablets (0.25 mg total) by mouth 2 (two) times a week. 09/23/21  Yes Cassandria Anger, MD  cholecalciferol (VITAMIN D) 1000 UNITS tablet Take 1,000 Units by mouth daily.    Yes [provider]  diclofenac Sodium (VOLTAREN) 1 % GEL Apply 2 g topically 4 (four) times daily. 07/14/21  Yes Dettinger, [name], MD  diphenhydramine-acetaminophen (TYLENOL PM) 25-500 MG TABS tablet Take 1/2 tablet by mouth at bedtime as needed for sleep   Yes [provider]  FEROSUL 325 (65 Fe) MG tablet TAKE (1) TABLET DAILY WITH BREAKFAST. Patient taking differently: Take 325 mg by mouth every other day. 02/10/21  Yes Dettinger, [name], MD  GAVILAX 17 GM/SCOOP powder DISSOLVE 17 GM SCOOP IN 8 OZ. OF WATER AND DRINK ONCE DAILY AS NEEDED FOR CONSTIPATION Patient taking differently: Take 17 g by mouth daily as needed for mild constipation. 06/29/20  Yes Pyrtle, Lajuan Lines, MD  guaiFENesin (MUCINEX) 600 MG 12 hr tablet Take 600 mg by mouth daily.   Yes [provider]  levothyroxine (SYNTHROID) 75 MCG tablet Take 1 tablet daily. Patient taking differently: Take 75 mcg by mouth daily before breakfast. 06/25/21  Yes Dettinger, [name], MD  magnesium oxide (MAG-OX) 400 (241.3 Mg) MG tablet  Take 1 tablet (400 mg total) by mouth 2 (two) times daily. Patient taking differently: Take 200 mg by mouth daily as needed (for leg cramps). 06/13/20  Yes Cristal Deer, MD  naphazoline-pheniramine (NAPHCON-A) 0.025-0.3 % ophthalmic solution Place 1 drop into the left eye 2 (two) times daily as needed for irritation or allergies.   Yes [provider]  nitroGLYCERIN (NITROSTAT) 0.4 MG SL tablet Place 1 tablet (0.4 mg total) under the tongue every 5 (five) minutes as needed for chest pain. 07/14/21  Yes Dettinger, [name], MD  pantoprazole (PROTONIX) 40 MG tablet TAKE ONE TABLET TWICE DAILY Patient taking differently: Take 40 mg by mouth See admin instructions. Take '40mg'$  by mouth in the morning then take '40mg'$  every other night 01/11/21  Yes Pyrtle, Lajuan Lines, MD  sucralfate (CARAFATE) 1 g tablet Take 1 tablet three times daily as needed Patient taking differently: Take 1 g by mouth daily as needed (for acid reflux). 11/03/20  Yes Pyrtle, Lajuan Lines, MD  tamsulosin (FLOMAX) 0.4 MG CAPS capsule Take 1 capsule (0.4 mg total) by mouth daily after  supper. 07/14/21  Yes Dettinger, [name], MD  traMADol (ULTRAM) 50 MG tablet Take 1 tablet (50 mg total) by mouth 2 (two) times daily as needed. Patient taking differently: Take 50 mg by mouth 2 (two) times daily as needed for moderate pain. 09/01/21  Yes Aundra Dubin, PA-C  vitamin B-12 (CYANOCOBALAMIN) 1000 MCG tablet Take 1,000 mcg by mouth daily.   Yes [provider]  vitamin E 180 MG (400 UNITS) capsule Take 400 Units by mouth daily.   Yes [provider]  Budeson-Glycopyrrol-Formoterol (BREZTRI AEROSPHERE) 160-9-4.8 MCG/ACT AERO Inhale 2 puffs into the lungs 2 (two) times daily. Patient not taking: Reported on 09/30/2021 03/17/21   Dettinger, [name], MD  psyllium (METAMUCIL) 28 % packet Take 1 packet by mouth daily. Patient taking differently: Take 1 packet by mouth daily as needed (for constipation). 06/07/21   Gwenlyn Perking, FNP   Respiratory Therapy Supplies (FLUTTER) DEVI Twice a day and prn as needed, may increase if feeling worse 05/01/18   Martyn Ehrich, NP  sodium chloride HYPERTONIC 3 % nebulizer solution Take by nebulization as needed for other. Patient taking differently: Take 4 mLs by nebulization daily. 06/18/20   Margaretha Seeds, MD  triamcinolone cream (KENALOG) 0.1 % Apply 1 application topically 2 (two) times daily. Patient taking differently: Apply 1 application  topically 2 (two) times daily as needed (itching). 10/31/18   Baruch Gouty, FNP    Current Facility-Administered Medications  Medication Dose Route Frequency Provider Last Rate Last Admin   0.9 %  sodium chloride infusion   Intravenous Continuous Doutova, Anastassia, MD 10 mL/hr at 10/01/21 0649 Rate Change at 10/01/21 6962   acetaminophen (TYLENOL) tablet 650 mg  650 mg Oral Q6H PRN Toy Baker, MD       Or   acetaminophen (TYLENOL) suppository 650 mg  650 mg Rectal Q6H PRN Doutova, Anastassia, MD       albuterol (PROVENTIL) (2.5 MG/3ML) 0.083% nebulizer solution 2.5 mg  2.5 mg Nebulization Q2H PRN Doutova, Anastassia, MD       Ampicillin-Sulbactam (UNASYN) 3 g in sodium chloride 0.9 % 100 mL IVPB  3 g Intravenous Q6H Shade, Christine E, RPH 200 mL/hr at 10/01/21 0645 Infusion Verify at 10/01/21 0645   [START ON 10/04/2021] cabergoline (DOSTINEX) tablet 0.25 mg  0.25 mg Oral Once per day on Mon Thu Doutova, Anastassia, MD       Chlorhexidine Gluconate Cloth 2 % PADS 6 each  6 each Topical Daily Doutova, Anastassia, MD       levothyroxine (SYNTHROID) tablet 75 mcg  75 mcg Oral Daily Doutova, Anastassia, MD       Oral care mouth rinse  15 mL Mouth Rinse PRN Doutova, Anastassia, MD       tamsulosin (FLOMAX) capsule 0.4 mg  0.4 mg Oral QPC supper Toy Baker, MD        Allergies as of 09/30/2021 - Review Complete 09/30/2021  Allergen Reaction Noted   Gadavist [gadobutrol] Hives 05/18/2018   Prednisone  03/10/2021   Betadine  [povidone iodine] Other (See Comments) 05/28/2015   Lortab [hydrocodone-acetaminophen] Nausea And Vomiting 07/18/2011   Penicillins Other (See Comments)    Zetia [ezetimibe] Other (See Comments) 06/23/2010   Zocor [simvastatin] Nausea Only and Other (See Comments) 06/23/2010    Review of Systems:    Constitutional: No weight loss, fever, chills, weakness or fatigue HEENT: Eyes: No change in vision  Ears, Nose, Throat:  No change in hearing or congestion Skin: No rash or itching Cardiovascular: No chest pain, chest pressure or palpitations   Respiratory: No SOB or cough Gastrointestinal: See HPI and otherwise negative Genitourinary: No dysuria or change in urinary frequency Neurological: No headache, dizziness or syncope Musculoskeletal: No new muscle or joint pain Hematologic: No bleeding or bruising Psychiatric: No history of depression or anxiety     Physical Exam:  Vital signs in last 24 hours: Temp:  [97.3 F (36.3 C)-98.5 F (36.9 C)] 98 F (36.7 C) (08/04 0621) Pulse Rate:  [62-94] 80 (08/04 0621) Resp:  [13-27] 27 (08/04 0621) BP: (104-154)/(53-101) 111/61 (08/04 0515) SpO2:  [91 %-97 %] 93 % (08/04 0621) Weight:  [68.9 kg-70.6 kg] 70.6 kg (08/04 0621)   Last BM recorded by nurses in past 5 days No data recorded  General: Elderly, pleasant 88 year old male Head:  Normocephalic and atraumatic.  Dry oral mucous membranes, poor dentition Eyes: sclerae anicteric,conjunctive pink  Heart:  regular rate and rhythm Pulm: Bilateral lower lobe rhonchi/coarse breath sounds with occasional expiratory wheeze Abdomen:  Soft, Non-distended AB, Sluggish bowel sounds. mild tenderness in the epigastrium. Without guarding and Without rebound, No organomegaly appreciated. Extremities:  Without edema. Msk:  Symmetrical without gross deformities. Peripheral pulses intact.  Neurologic:  Alert and  oriented x4;  No focal deficits.  Skin:   Dry and intact without significant  lesions or rashes. Psychiatric:  Cooperative. Normal mood and affect.  LAB RESULTS: Recent Labs    09/30/21 1740 09/30/21 2103 10/01/21 0435  WBC 11.9* 17.5* 15.0*  HGB 12.0* 11.6* 9.6*  HCT 37.6* 36.4* 29.9*  PLT 248 236 195   BMET Recent Labs    09/30/21 1740 10/01/21 0435  NA 137 141  K 3.8 4.0  CL 108 113*  CO2 23 23  GLUCOSE 133* 116*  BUN 40* 28*  CREATININE 0.95 0.93  CALCIUM 8.5* 7.7*   LFT Recent Labs    10/01/21 0435  PROT 5.4*  ALBUMIN 2.9*  AST 18  ALT 16  ALKPHOS 55  BILITOT 0.6   PT/INR Recent Labs    09/30/21 1740  LABPROT 13.1  INR 1.0    STUDIES: CT Angio Chest/Abd/Pel for Dissection W and/or W/WO  Result Date: 09/30/2021 CLINICAL DATA:  Acute aortic syndrome suspected. Cough with sputum and bright red blood. History of Barrett's esophagus. EXAM: CT ANGIOGRAPHY CHEST, ABDOMEN AND PELVIS TECHNIQUE: Non-contrast CT of the chest was initially obtained. Multidetector CT imaging through the chest, abdomen and pelvis was performed using the standard protocol during bolus administration of intravenous contrast. Multiplanar reconstructed images and MIPs were obtained and reviewed to evaluate the vascular anatomy. RADIATION DOSE REDUCTION: This exam was performed according to the departmental dose-optimization program which includes automated exposure control, adjustment of the mA and/or kV according to patient size and/or use of iterative reconstruction technique. CONTRAST:  194m OMNIPAQUE IOHEXOL 350 MG/ML SOLN COMPARISON:  CT chest 06/09/2020.  CT abdomen and pelvis 10/05/2018 FINDINGS: CTA CHEST FINDINGS Cardiovascular: Noncontrast images of the chest demonstrate calcification in the aorta and coronary arteries. No evidence of intramural hematoma. Images obtained during the arterial phase after contrast material injection demonstrates an ascending aortic aneurysm measuring 4.2 cm in diameter, unchanged since prior study. No evidence of aortic dissection.  Calcified and noncalcified plaque formation mostly in the descending aorta. Great vessel origins are patent. Central pulmonary arteries appear patent without evidence of significant pulmonary embolus. Normal heart size. No pericardial effusions. Mediastinum/Nodes: Thyroid  gland is atrophic. No significant lymphadenopathy in the chest. The esophagus is diffusely distended and thick-walled with internal debris. This is likely related to the patient's history of esophagitis with Barretts esophagus. The degree of esophageal distention is increased since the prior study and the possibility of a developing obstructing lesion should be considered. Suggest follow-up with gastroenterology for consideration of endoscopy. Lungs/Pleura: Emphysematous changes in the lungs. Mild infiltration in the lung bases. Bronchiectasis with opacification of lower lobe bronchi, possibly secretions or aspiration. Apical pleural calcifications are greater on the left, likely postinflammatory. No pleural effusions. Musculoskeletal: Old rib fractures. Compression of the T8 vertebral body is unchanged since prior study. Review of the MIP images confirms the above findings. CTA ABDOMEN AND PELVIS FINDINGS VASCULAR Aorta: Normal caliber aorta without aneurysm, dissection, vasculitis or significant stenosis. Diffuse aortic calcification. Celiac: Celiac axis supplies the splenic artery and left gastric artery. Vessels appear patent without evidence of aneurysm, occlusion, or dissection. SMA: The hepatic artery arises from the superior mesenteric artery. Both vessels are patent without evidence of aneurysm, occlusion, or dissection. Renals: Both renal arteries are patent without evidence of aneurysm, dissection, vasculitis, fibromuscular dysplasia or significant stenosis. IMA: Patent without evidence of aneurysm, dissection, vasculitis or significant stenosis. Inflow: Patent without evidence of aneurysm, dissection, vasculitis or significant stenosis.  Veins: No obvious venous abnormality within the limitations of this arterial phase study. Review of the MIP images confirms the above findings. NON-VASCULAR Hepatobiliary: No focal liver abnormality is seen. No gallstones, gallbladder wall thickening, or biliary dilatation. Pancreas: Unremarkable. No pancreatic ductal dilatation or surrounding inflammatory changes. Spleen: Normal in size without focal abnormality. Adrenals/Urinary Tract: Adrenal glands are unremarkable. Kidneys are normal, without renal calculi, focal lesion, or hydronephrosis. Bladder is unremarkable. Stomach/Bowel: Stomach, small bowel, and colon are not abnormally distended. Stool fills the colon. No wall thickening or inflammatory changes are appreciated. Appendix is normal. Lymphatic: No significant lymphadenopathy. Reproductive: Prostate gland is not enlarged. Visualization is limited due to streak artifact from hip prostheses. Other: No free air or free fluid in the abdomen. Abdominal wall musculature appears intact. Musculoskeletal: Bilateral total hip arthroplasties. Degenerative changes in the lumbar spine. Review of the MIP images confirms the above findings. IMPRESSION: 1. Diffuse aortic atherosclerosis. No evidence of aortic dissection. 2. 4.2 cm diameter ascending thoracic aortic aneurysm. Recommend annual imaging followup by CTA or MRA. This recommendation follows 2010 ACCF/AHA/AATS/ACR/ASA/SCA/SCAI/SIR/STS/SVM Guidelines for the Diagnosis and Management of Patients with Thoracic Aortic Disease. Circulation. 2010; 121: G644-I347. Aortic aneurysm NOS (ICD10-I71.9) 3. Bronchiectasis with mucous plugging versus secretions in the lung bases as well as peribronchial infiltrates. Changes may represent aspiration or pneumonia. 4. Increased distention and thick-walled appearance of the esophagus since prior study. Changes likely related to history of Barrett's esophagus and esophagitis. Consider gastroenterology referral to exclude  obstructing lesion. Electronically Signed   By: Lucienne Capers M.D.   On: 09/30/2021 19:40   DG Chest Portable 1 View  Result Date: 09/30/2021 CLINICAL DATA:  Cough, hemoptysis EXAM: PORTABLE CHEST 1 VIEW COMPARISON:  06/28/2021 FINDINGS: Cardiac size is within normal limits. There are no signs of pulmonary edema. Left hemidiaphragm is elevated. There is interval clearing of linear density in the lateral aspect of the right lower lung field. New linear densities are seen in medial right lower lung field. There is no significant pleural effusion or pneumothorax. There are multiple old healed fractures in the left ribs. IMPRESSION: Small new linear densities in the medial right lower lung fields suggest subsegmental atelectasis. There are  no signs of pulmonary edema or focal pulmonary consolidation. Electronically Signed   By: Elmer Picker M.D.   On: 09/30/2021 18:31     Vladimir Crofts  10/01/2021, 8:01 AM  ----------------------------------------------------------------  I have taken a history, reviewed the chart and examined the patient. I performed a substantive portion of this encounter, including complete performance of at least one of the key components, in conjunction with the APP. I agree with the APP's note, impression and recommendations  88 year old male with bronchiectasis, A-fib and coronary artery disease and history of GERD with esophageal stricture and history of severe reflux esophagitis, who presented with chest discomfort and large-volume hematemesis, resulting in four-point drop in hemoglobin.  His presentation was initially concerning for dissecting aorta, but CT a ruled this out.  His bleeding appears to have slowed down quite a bit, but now the patient is unable to eat, and can only drink small amounts. I suspect the patient may have bled again from and esophageal ulcer, and I wonder if his dysphagia is now due to large clot burden at the level of his esophageal  stricture. We will plan to perform an upper endoscopy tomorrow to evaluate source of bleeding and potentially improve dysphagia, although if his dysphagia is from a significant stricture, I would not feel comfortable dilating in the setting of a GI bleed. I had a long discussion with the patient and his son/daughter-in-law regarding the risks and benefits of upper endoscopy.  He is certainly high risk for sedation, but given the severity of his symptoms, I think an upper endoscopy is absolutely necessary at this point. We will plan for EGD tomorrow morning. Continue Protonix IV, clear liquid diet as tolerated  Laronda Lisby E. Candis Schatz, MD [facility]

## 2021-10-01 NOTE — Progress Notes (Signed)
[facility] PROGRESS NOTE   HUTCHINSON ISENBERG OVZ:[id number] DOB: [birth year] DOA: 09/30/2021  PCP: Dettinger, [name], MD  Brief History/Interval Summary: 88 y.o. male with medical history significant of GERD Berretts esophagus, pituitary microadenoma who presented with hemoptysis versus hematemesis.  It looks like initially patient did have episodes of hematemesis which was followed by episodes of hemoptysis.  Patient was hospitalized for further management.  Consultants: Gastroenterology  Procedures: None yet    Subjective/Interval History: Patient has been coughing up blood this morning.  His son and daughter-in-law at the bedside.  Patient admits to upper abdominal discomfort.  Denies any chest pain or shortness of breath.    Assessment/Plan:  Upper GI bleed. There was some confusion initially whether patient had hematemesis or hemoptysis.  Looks like patient initially with vomiting of blood and then this was followed by hemoptysis.  I think he originally had upper GI bleed.  Gastroenterology has been consulted.  Patient not on any anticoagulants.  Continue with PPI infusion.  Will likely need to undergo upper endoscopy.  Drop in hemoglobin noted. Last upper endoscopy was in April 2022 which showed esophagitis and hiatal hernia. CT angiogram showed distention and thick-walled appearance of esophagus.  Patient mentions that he has had esophageal dilatation previously.  Acute blood loss anemia Presented with hemoglobin of 12.  Noted to be 9.6 this morning.  Continue to monitor closely.  Transfuse if it drops below 7.  Aspiration pneumonia/hemoptysis Hemoptysis likely from blood that he may have aspirated after his episodes of vomiting.  CT angiogram did show just changes of bronchiectasis with mucous plugging.  Concern for aspiration pneumonia.  Patient started on Unasyn.  Respiratory status stable.  Procalcitonin noted to be less than 0.1.  History of atrial fibrillation,  paroxysmal Aspirin on hold.  Not on anticoagulation.  History of COPD Stable.  BPH Continue Flomax.  Pituitary adenoma Followed by endocrinology as outpatient.  Thoracic aortic aneurysm No evidence for dissection on CT angiogram.  Outpatient monitoring.  Hypothyroidism Continue levothyroxine.  DVT Prophylaxis: SCDs Code Status: Full code Family Communication: Discussed with patient and his family Disposition Plan: Hopefully return home when improved  Status is: Inpatient Remains inpatient appropriate because: Upper GI bleed    Medications: Scheduled:  [START ON 10/04/2021] cabergoline  0.25 mg Oral Once per day on Mon Thu   Chlorhexidine Gluconate Cloth  6 each Topical Daily   levothyroxine  75 mcg Oral Daily   tamsulosin  0.4 mg Oral QPC supper   Continuous:  ampicillin-sulbactam (UNASYN) IV 200 mL/hr at 10/01/21 0645   AJO:INOMVEHMCNOBS **OR** acetaminophen, albuterol, mouth rinse  Antibiotics: Anti-infectives (From admission, onward)    Start     Dose/Rate Route Frequency Ordered Stop   09/30/21 2200  Ampicillin-Sulbactam (UNASYN) 3 g in sodium chloride 0.9 % 100 mL IVPB        3 g 200 mL/hr over 30 Minutes Intravenous Every 6 hours 09/30/21 2146     09/30/21 1830  erythromycin 250 mg in sodium chloride 0.9 % 100 mL IVPB        250 mg 100 mL/hr over 60 Minutes Intravenous  Once 09/30/21 1801 09/30/21 2133   09/30/21 1800  erythromycin 205 mg in sodium chloride 0.9 % 100 mL IVPB  Status:  Discontinued        3 mg/kg  68.9 kg 100 mL/hr over 60 Minutes Intravenous  Once 09/30/21 1749 09/30/21 1801       Objective:  Vital Signs  Vitals:  10/01/21 0410 10/01/21 0515 10/01/21 0559 10/01/21 0621  BP:  111/61    Pulse:  64  80  Resp:  17  (!) 27  Temp: 98 F (36.7 C)   98 F (36.7 C)  TempSrc: Oral   Oral  SpO2:  95%  93%  Weight:    70.6 kg  Height:   '5\' 7"'$  (1.702 m)     Intake/Output Summary (Last 24 hours) at 10/01/2021 0900 Last data filed at  10/01/2021 0645 Gross per 24 hour  Intake 1115.61 ml  Output 800 ml  Net 315.61 ml   Filed Weights   09/30/21 1641 10/01/21 0621  Weight: 68.9 kg 70.6 kg    General appearance: Awake alert.  In no distress Resp: Normal effort at rest.  Crackles at the bases bilaterally.  No wheezing or rhonchi. Cardio: S1-S2 is normal regular.  No S3-S4.  No rubs murmurs or bruit GI: Abdomen is soft.  Mildly tender in the epigastric area without any rebound rigidity or guarding.  No masses organomegaly. Extremities: No edema.  Full range of motion of lower extremities. Neurologic: Alert and oriented x3.  No focal neurological deficits.    Lab Results:  Data Reviewed: I have personally reviewed following labs and reports of the imaging studies  CBC: Recent Labs  Lab 09/30/21 1740 09/30/21 2103 10/01/21 0435  WBC 11.9* 17.5* 15.0*  NEUTROABS 8.8*  --   --   HGB 12.0* 11.6* 9.6*  HCT 37.6* 36.4* 29.9*  MCV 93.3 94.1 94.9  PLT 248 236 222    Basic Metabolic Panel: Recent Labs  Lab 09/30/21 1740 09/30/21 2103 10/01/21 0435  NA 137  --  141  K 3.8  --  4.0  CL 108  --  113*  CO2 23  --  23  GLUCOSE 133*  --  116*  BUN 40*  --  28*  CREATININE 0.95  --  0.93  CALCIUM 8.5*  --  7.7*  MG  --  2.0  --   PHOS  --  2.4*  --     GFR: Estimated Creatinine Clearance: 52.3 mL/min (by C-G formula based on SCr of 0.93 mg/dL).  Liver Function Tests: Recent Labs  Lab 09/30/21 1740 10/01/21 0435  AST 23 18  ALT 21 16  ALKPHOS 74 55  BILITOT 0.5 0.6  PROT 7.0 5.4*  ALBUMIN 3.8 2.9*    Recent Labs  Lab 09/30/21 1740  LIPASE 35    Coagulation Profile: Recent Labs  Lab 09/30/21 1740  INR 1.0    Cardiac Enzymes: Recent Labs  Lab 09/30/21 2103  CKTOTAL 85     CBG: Recent Labs  Lab 09/30/21 1701  GLUCAP 136*     Thyroid Function Tests: Recent Labs    09/30/21 2103  TSH 2.622     Recent Results (from the past 240 hour(s))  Expectorated Sputum Assessment w  Gram Stain, Rflx to Resp Cult     Status: None   Collection Time: 10/01/21  2:41 AM   Specimen: Expectorated Sputum  Result Value Ref Range Status   Specimen Description EXPECTORATED SPUTUM  Final   Special Requests NONE  Final   Sputum evaluation   Final    THIS SPECIMEN IS ACCEPTABLE FOR SPUTUM CULTURE Performed at [facility], [place] [address], [place], [place] [address]    Report Status 10/01/2021 FINAL  Final  Culture, Respiratory w Gram Stain     Status: None (Preliminary result)   Collection  Time: 10/01/21  2:41 AM  Result Value Ref Range Status   Specimen Description   Final    EXPECTORATED SPUTUM Performed at [place] [address] [place], [place] [address]    Special Requests   Final    NONE Reflexed from [id number] Performed at [facility], [place] [address], [place], [place] [address]    Gram Stain   Final    FEW SQUAMOUS EPITHELIAL CELLS PRESENT FEW WBC PRESENT, PREDOMINANTLY MONONUCLEAR FEW GRAM NEGATIVE RODS FEW GRAM POSITIVE COCCI IN PAIRS Performed at [facility], [place] [address], [place], [place] [address]    Culture PENDING  Incomplete   Report Status PENDING  Incomplete  MRSA Next Gen by PCR, Nasal     Status: None   Collection Time: 10/01/21  4:30 AM   Specimen: Nasal Mucosa; Nasal Swab  Result Value Ref Range Status   MRSA by PCR Next Gen NOT DETECTED NOT DETECTED Final    Comment: (NOTE) The GeneXpert MRSA Assay (FDA approved for NASAL specimens only), is one component of a comprehensive MRSA colonization surveillance program. It is not intended to diagnose MRSA infection nor to guide or monitor treatment for MRSA infections. Test performance is not FDA approved in patients less than 86 years old. Performed at [facility], [place] [address] [place], [place] [address]       Radiology Studies: CT Angio Chest/Abd/Pel for Dissection W and/or W/WO  Result Date:  09/30/2021 CLINICAL DATA:  Acute aortic syndrome suspected. Cough with sputum and bright red blood. History of Barrett's esophagus. EXAM: CT ANGIOGRAPHY CHEST, ABDOMEN AND PELVIS TECHNIQUE: Non-contrast CT of the chest was initially obtained. Multidetector CT imaging through the chest, abdomen and pelvis was performed using the standard protocol during bolus administration of intravenous contrast. Multiplanar reconstructed images and MIPs were obtained and reviewed to evaluate the vascular anatomy. RADIATION DOSE REDUCTION: This exam was performed according to the departmental dose-optimization program which includes automated exposure control, adjustment of the mA and/or kV according to patient size and/or use of iterative reconstruction technique. CONTRAST:  117m OMNIPAQUE IOHEXOL 350 MG/ML SOLN COMPARISON:  CT chest 06/09/2020.  CT abdomen and pelvis 10/05/2018 FINDINGS: CTA CHEST FINDINGS Cardiovascular: Noncontrast images of the chest demonstrate calcification in the aorta and coronary arteries. No evidence of intramural hematoma. Images obtained during the arterial phase after contrast material injection demonstrates an ascending aortic aneurysm measuring 4.2 cm in diameter, unchanged since prior study. No evidence of aortic dissection. Calcified and noncalcified plaque formation mostly in the descending aorta. Great vessel origins are patent. Central pulmonary arteries appear patent without evidence of significant pulmonary embolus. Normal heart size. No pericardial effusions. Mediastinum/Nodes: Thyroid gland is atrophic. No significant lymphadenopathy in the chest. The esophagus is diffusely distended and thick-walled with internal debris. This is likely related to the patient's history of esophagitis with Barretts esophagus. The degree of esophageal distention is increased since the prior study and the possibility of a developing obstructing lesion should be considered. Suggest follow-up with  gastroenterology for consideration of endoscopy. Lungs/Pleura: Emphysematous changes in the lungs. Mild infiltration in the lung bases. Bronchiectasis with opacification of lower lobe bronchi, possibly secretions or aspiration. Apical pleural calcifications are greater on the left, likely postinflammatory. No pleural effusions. Musculoskeletal: Old rib fractures. Compression of the T8 vertebral body is unchanged since prior study. Review of the MIP images confirms the above findings. CTA ABDOMEN AND PELVIS FINDINGS VASCULAR Aorta: Normal caliber aorta without aneurysm,  dissection, vasculitis or significant stenosis. Diffuse aortic calcification. Celiac: Celiac axis supplies the splenic artery and left gastric artery. Vessels appear patent without evidence of aneurysm, occlusion, or dissection. SMA: The hepatic artery arises from the superior mesenteric artery. Both vessels are patent without evidence of aneurysm, occlusion, or dissection. Renals: Both renal arteries are patent without evidence of aneurysm, dissection, vasculitis, fibromuscular dysplasia or significant stenosis. IMA: Patent without evidence of aneurysm, dissection, vasculitis or significant stenosis. Inflow: Patent without evidence of aneurysm, dissection, vasculitis or significant stenosis. Veins: No obvious venous abnormality within the limitations of this arterial phase study. Review of the MIP images confirms the above findings. NON-VASCULAR Hepatobiliary: No focal liver abnormality is seen. No gallstones, gallbladder wall thickening, or biliary dilatation. Pancreas: Unremarkable. No pancreatic ductal dilatation or surrounding inflammatory changes. Spleen: Normal in size without focal abnormality. Adrenals/Urinary Tract: Adrenal glands are unremarkable. Kidneys are normal, without renal calculi, focal lesion, or hydronephrosis. Bladder is unremarkable. Stomach/Bowel: Stomach, small bowel, and colon are not abnormally distended. Stool fills the  colon. No wall thickening or inflammatory changes are appreciated. Appendix is normal. Lymphatic: No significant lymphadenopathy. Reproductive: Prostate gland is not enlarged. Visualization is limited due to streak artifact from hip prostheses. Other: No free air or free fluid in the abdomen. Abdominal wall musculature appears intact. Musculoskeletal: Bilateral total hip arthroplasties. Degenerative changes in the lumbar spine. Review of the MIP images confirms the above findings. IMPRESSION: 1. Diffuse aortic atherosclerosis. No evidence of aortic dissection. 2. 4.2 cm diameter ascending thoracic aortic aneurysm. Recommend annual imaging followup by CTA or MRA. This recommendation follows 2010 ACCF/AHA/AATS/ACR/ASA/SCA/SCAI/SIR/STS/SVM Guidelines for the Diagnosis and Management of Patients with Thoracic Aortic Disease. Circulation. 2010; 121: V374-M270. Aortic aneurysm NOS (ICD10-I71.9) 3. Bronchiectasis with mucous plugging versus secretions in the lung bases as well as peribronchial infiltrates. Changes may represent aspiration or pneumonia. 4. Increased distention and thick-walled appearance of the esophagus since prior study. Changes likely related to history of Barrett's esophagus and esophagitis. Consider gastroenterology referral to exclude obstructing lesion. Electronically Signed   By: Lucienne Capers M.D.   On: 09/30/2021 19:40   DG Chest Portable 1 View  Result Date: 09/30/2021 CLINICAL DATA:  Cough, hemoptysis EXAM: PORTABLE CHEST 1 VIEW COMPARISON:  06/28/2021 FINDINGS: Cardiac size is within normal limits. There are no signs of pulmonary edema. Left hemidiaphragm is elevated. There is interval clearing of linear density in the lateral aspect of the right lower lung field. New linear densities are seen in medial right lower lung field. There is no significant pleural effusion or pneumothorax. There are multiple old healed fractures in the left ribs. IMPRESSION: Small new linear densities in the  medial right lower lung fields suggest subsegmental atelectasis. There are no signs of pulmonary edema or focal pulmonary consolidation. Electronically Signed   By: Elmer Picker M.D.   On: 09/30/2021 18:31       LOS: 1 day   [facility] Pager on [email or web address]  10/01/2021, 9:00 AM

## 2021-10-01 NOTE — Progress Notes (Signed)
Echocardiogram 2D Echocardiogram has been performed.  Oneal Deputy Azreal Stthomas RDCS 10/01/2021, 8:40 AM

## 2021-10-01 NOTE — Evaluation (Signed)
SLP Cancellation Note  Patient Details Name: [name] MRN: [id number] DOB: 1932/10/09   Cancelled treatment:       Reason Eval/Treat Not Completed: Other (comment) (pt with UGI bleed and for GI referral, will continue efforts)  Kathleen Lime, MS [facility] SLP [place] Office [phone] Pager [phone]  Macario Golds 10/01/2021, 8:17 AM

## 2021-10-01 NOTE — Evaluation (Signed)
Occupational Therapy Evaluation Patient Details Name: [name] MRN: [id number] DOB: [birth year] Today's Date: 10/01/2021   History of Present Illness 88 y.o. male with medical history significant of GERD Berretts esophagus, pituitary microadenoma who presented with hemoptysis versus hematemesis. PMH: CAD, neuropathy, COPD. Bil THA,.   Clinical Impression   [name] is an 88 year old man admitted to hospital with above medical history. On evaluation she is min guard to supervision for ambulation and most Adls. He needed assistance for socks and reaching feet but he reports he has difficulty with it at baseline. Will follow acutely to make sure he maintains his functional abilities in order to return home at discharge.      Recommendations for follow up therapy are one component of a multi-disciplinary discharge planning process, led by the attending physician.  Recommendations may be updated based on patient status, additional functional criteria and insurance authorization.   Follow Up Recommendations  No OT follow up    Assistance Recommended at Discharge PRN  Patient can return home with the following A little help with bathing/dressing/bathroom    Functional Status Assessment  Patient has had a recent decline in their functional status and demonstrates the ability to make significant improvements in function in a reasonable and predictable amount of time.  Equipment Recommendations  None recommended by OT    Recommendations for Other Services       Precautions / Restrictions Precautions Precautions: Fall Precaution Comments: reports several falls Restrictions Weight Bearing Restrictions: No      Mobility Bed Mobility Overal bed mobility: Needs Assistance Bed Mobility: Supine to Sit     Supine to sit: Supervision     General bed mobility comments: manage lines    Transfers Overall transfer level: Needs assistance Equipment used: Rolling walker (2  wheels) Transfers: Sit to/from Stand Sit to Stand: Min guard           General transfer comment: cues for safety, wait for lines  managed      Balance Overall balance assessment: Needs assistance, History of Falls Sitting-balance support: Feet supported, No upper extremity supported Sitting balance-Leahy Scale: Good     Standing balance support: During functional activity, Bilateral upper extremity supported Standing balance-Leahy Scale: Fair                             ADL either performed or assessed with clinical judgement   ADL Overall ADL's : Needs assistance/impaired Eating/Feeding: Independent   Grooming: Supervision/safety   Upper Body Bathing: Supervision/ safety   Lower Body Bathing: Supervison/ safety   Upper Body Dressing : Supervision/safety   Lower Body Dressing: Minimal assistance   Toilet Transfer: Supervision/safety   Toileting- Clothing Manipulation and Hygiene: Supervision/safety       Functional mobility during ADLs: Min guard;Rolling walker (2 wheels)       Vision Patient Visual Report: No change from baseline       Perception     Praxis      Pertinent Vitals/Pain Pain Assessment Pain Assessment: No/denies pain     Hand Dominance Right   Extremity/Trunk Assessment Upper Extremity Assessment Upper Extremity Assessment: Overall WFL for tasks assessed   Lower Extremity Assessment Lower Extremity Assessment: Defer to PT evaluation   Cervical / Trunk Assessment Cervical / Trunk Assessment: Kyphotic   Communication Communication Communication: No difficulties   Cognition Arousal/Alertness: Awake/alert Behavior During Therapy: WFL for tasks assessed/performed Overall Cognitive Status: Within Functional  Limits for tasks assessed                                       General Comments       Exercises     Shoulder Instructions      Home Living Family/patient expects to be discharged to::  Private residence Living Arrangements: Alone Available Help at Discharge: Family;Available PRN/intermittently Type of Home: House Home Access: Stairs to enter;Ramped entrance Entrance Stairs-Number of Steps: 4 Entrance Stairs-Rails: Right;Left Home Layout: One level Alternate Level Stairs-Number of Steps: basement   Bathroom Shower/Tub: Occupational psychologist: Handicapped height     Home Equipment: Cane - single point;Rollator (4 wheels);Rolling Walker (2 wheels);Grab bars - tub/shower   Additional Comments: has a walk in tub if needed      Prior Functioning/Environment Prior Level of Function : Independent/Modified Independent             Mobility Comments: uses a cane ADLs Comments: independent with ADLs, drives, does household chores        OT Problem List: Decreased activity tolerance;Impaired balance (sitting and/or standing)      OT Treatment/Interventions: Self-care/ADL training    OT Goals(Current goals can be found in the care plan section) Acute Rehab OT Goals Patient Stated Goal: return to normal OT Goal Formulation: With patient Time For Goal Achievement: 10/15/21 Potential to Achieve Goals: Good  OT Frequency: Min 1X/week    Co-evaluation              AM-PAC OT "6 Clicks" Daily Activity     Outcome Measure Help from another person eating meals?: None Help from another person taking care of personal grooming?: None Help from another person toileting, which includes using toliet, bedpan, or urinal?: A Little Help from another person bathing (including washing, rinsing, drying)?: None Help from another person to put on and taking off regular upper body clothing?: None Help from another person to put on and taking off regular lower body clothing?: A Little 6 Click Score: 22   End of Session Equipment Utilized During Treatment: Gait belt;Rolling walker (2 wheels) Nurse Communication: Mobility status  Activity Tolerance: Patient  tolerated treatment well Patient left: in chair;with call bell/phone within reach;with family/visitor present  OT Visit Diagnosis: Muscle weakness (generalized) (M62.81)                Time: 3419-6222 OT Time Calculation (min): 19 min Charges:  OT General Charges $OT Visit: 1 Visit OT Evaluation $OT Eval Low Complexity: 1 Low  [name], OTR/L [place]  Office [phone] Pager: [phone]   Lenward Chancellor 10/01/2021, 3:46 PM

## 2021-10-01 NOTE — Anesthesia Preprocedure Evaluation (Addendum)
Anesthesia Evaluation  Patient identified by MRN, date of birth, ID band Patient awake    Reviewed: Allergy & Precautions, NPO status , Patient's Chart, lab work & pertinent test results  Airway Mallampati: II  TM Distance: >3 FB Neck ROM: Full    Dental no notable dental hx. (+) Teeth Intact, Dental Advisory Given   Pulmonary COPD,    Pulmonary exam normal breath sounds clear to auscultation       Cardiovascular + CAD  Normal cardiovascular exam+ dysrhythmias Atrial Fibrillation  Rhythm:Regular Rate:Normal  10/01/2021 Echo  1. Left ventricular ejection fraction, by estimation, is 60 to 65%. The  left ventricle has normal function. The left ventricle has no regional  wall motion abnormalities. Left ventricular diastolic parameters are  consistent with Grade II diastolic  dysfunction (pseudonormalization).  2. Right ventricular systolic function is normal. The right ventricular  size is normal. There is normal pulmonary artery systolic pressure. The  estimated right ventricular systolic pressure is 74.1 mmHg.  3. Left atrial size was mildly dilated.  4. Right atrial size was mildly dilated.  5. The mitral valve is normal in structure. Mild mitral valve  regurgitation. No evidence of mitral stenosis.  6. The aortic valve is tricuspid. Aortic valve regurgitation is trivial.  No aortic stenosis is present.  7. Aortic dilatation noted. There is mild dilatation of the aortic root,  measuring 38 mm.  8. The inferior vena cava is normal in size with greater than 50%  respiratory variability, suggesting right atrial pressure of 3 mmHg.   Neuro/Psych    GI/Hepatic Neg liver ROS, hiatal hernia, GERD  ,Barretts esophagus    Endo/Other  Hypothyroidism   Renal/GU Lab Results      Component                Value               Date                          K                        4.0                 10/01/2021                      Musculoskeletal  (+) Arthritis ,   Abdominal   Peds  Hematology  (+) Blood dyscrasia, anemia , Lab Results      Component                Value               Date                      WBC                      14.2 (H)            10/01/2021                HGB                      9.4 (L)             10/01/2021                HCT  29.0 (L)            10/01/2021                MCV                      94.5                10/01/2021                PLT                      184                 10/01/2021              Anesthesia Other Findings ALL: Zocor, Prednisone, Zetia, PCn, Lortab  Reproductive/Obstetrics                           Anesthesia Physical Anesthesia Plan  ASA: 3  Anesthesia Plan: MAC   Post-op Pain Management:    Induction:   PONV Risk Score and Plan: TIVA, Propofol infusion and Treatment may vary due to age or medical condition  Airway Management Planned: Simple Face Mask, Nasal Cannula and Natural Airway  Additional Equipment: None  Intra-op Plan:   Post-operative Plan:   Informed Consent: I have reviewed the patients History and Physical, chart, labs and discussed the procedure including the risks, benefits and alternatives for the proposed anesthesia with the patient or authorized representative who has indicated his/her understanding and acceptance.     Dental advisory given  Plan Discussed with:   Anesthesia Plan Comments: (EGD for Anemia Hematemesis abnormal CT )       Anesthesia Quick Evaluation

## 2021-10-01 NOTE — Progress Notes (Addendum)
Initial Nutrition Assessment  DOCUMENTATION CODES:   Not applicable  INTERVENTION:  - diet advancement as medically feasible.  - complete NFPE when feasible.  NUTRITION DIAGNOSIS:   Inadequate oral intake related to inability to eat as evidenced by NPO status.  GOAL:   Patient will meet greater than or equal to 90% of their needs  MONITOR:   Diet advancement, Labs, Weight trends  REASON FOR ASSESSMENT:   Consult Assessment of nutrition requirement/status  ASSESSMENT:   88 y.o. male with medical history of GERD, Berretts esophagus, pituitary microadenoma, hypothyroidism, arthritis, RLS, asthma, depression, CAD, emphysema, COPD, afib, neuropathy, esophageal stenosis, hiatal hernia, diverticulosis, osteoporosis, vitamin B12 deficiency, and skin cancer. He presented to the ED due to hemoptysis versus hematemesis and he was hospitalized for further management.  Patient has been NPO since admission. He has not been seen by a [facility] RD at any time in the past. He is noted to be a/o to self only as of 0900 today.   Weight today is 156 lb and weight on 7/5 was 155 lb. These are the two lowest weights since 06/07/21 with weight since that time of ~162 lb.   No information documented in the edema section of flow sheet this admission.   Per notes: - upper GIB--CT angio showed distension and thick-walled appearance of esophagus - hx of esophageal dilatation  - ABLA   Labs reviewed; Cl: 113 mmol/l, BUN: 28 mg/dl, Ca: 7.7 mg/dl, Phos: 2.4 mg/dl.  Medications reviewed; 75 mcg oral synthroid/day, 40 mg IV protonix BID, 15 mmol IV KPhos x1 run 8/4.  IVF; NS @ 50 ml/hr.     NUTRITION - FOCUSED PHYSICAL EXAM:  Unable at this time.  Diet Order:   Diet Order             Diet NPO time specified Except for: Sips with Meds  Diet effective now                   EDUCATION NEEDS:   No education needs have been identified at this time  Skin:  Skin Assessment: Reviewed RN  Assessment  Last BM:  PTA/unknown  Height:   Ht Readings from Last 1 Encounters:  10/01/21 '5\' 7"'$  (1.702 m)    Weight:   Wt Readings from Last 1 Encounters:  10/01/21 70.6 kg     BMI:  Body mass index is 24.38 kg/m.  Estimated Nutritional Needs:  Kcal:  1600-1800 kcal Protein:  80-90 grams Fluid:  >/= 1.8 L/day      Jarome Matin, MS, RD, LDN, CNSC Registered Dietitian II Inpatient Clinical Nutrition RD pager # and on-call/weekend pager # available in [facility]

## 2021-10-01 NOTE — Consult Note (Addendum)
Consultation  Referring Provider:   [facility] Primary Care Physician:  Dettinger, [name], MD Primary Gastroenterologist:  Dr. Hilarie Fredrickson       Reason for Consultation:     Anemia   Impression    Upper GI bleed with history 2022 with chronic GERD/Barrett's esophagus is not been taking Protonix twice daily, not been taking Carafate if he takes the Protonix. EGD 06/12/2020 in [place] for anemia showed grade D esophagitis above GE junction without bleeding, no stenosis, 2 cm hiatal hernia, gastritis.  Normal duodenum-no repeat  Acute dysphagia Patient was able to eat solid foods yesterday, currently having cough/regurgitation of even liquids. Patient does take steroid inhalers, no odynophagia but cannot rule out infectious esophagitis. Currently pending speech evaluation with modified barium swallow which I agree with Could be worsening esophagitis/obstruction.  Aspiration pneumonia in setting of bronchiectasis/COPD Being treated with Unasyn IV first dose 08/03 On room air Higher risk for endoscopic procedures  Prolactinoma Follows Dr. Loanne Drilling is on bromocriptine  Atrial fibrillation Not on anticoagulation other than low-dose aspirin every other day.    Plan   88 year old male with history of upper GI bleed 2022 previous chronic GERD/Barrett's esophagus/peptic ulcers disease.  Not taking PPI/Carafate as supposed to presents with 2.5 g drop in hemoglobin with aspiration pneumonia and regurgitation/hemoptysis.   No melena or hematochezia.     Patient is very high risk for endoscopic evaluation with bronchiectasis and current aspiration pneumonia, last endoscopy 05/2020 showed grade D esophagitis above GE junction no stenosis hiatal hernia and gastritis. Unknown if acute dysphagia is from oropharynx, MBS ordered.  Agree with this At this time patient states cannot take anything by mouth other than ice chips, continue infusion with subsequent IV Protonix injection every 12  hours. Can potentially do trial of GI cocktail or liquid Carafate but worried about aspiration risk. It is something is best if we optimize medications, continue with MBS, timing of endoscopy depends on availability of anesthesia and recommendations per Dr. Candis Schatz   Thank you for your kind consultation, we will continue to follow.         HPI:   [name] is a 88 y.o. male with past medical history significant for chronic constipation, bronchiectasis, A-fib, ascending aortic aneurysm, prolactinoma, history of GERD with esophagitis, Barrett's esophagus, history of esophageal stricture status post dilatation, history of hiatal hernia repair with recurrence presents for upper GI bleed.  Patient had similar presentation of severe anemia 06/12/2020 in [place].  Had EGD colonoscopy at that time.  EGD showed grade D esophagitis above GE junction without bleeding, no stenosis, 2 cm hiatal hernia, gastritis.  Normal duodenum Colonoscopy scheduled for metal stool throughout the colon, tortuous colon especially right side, 2 mm polyp ascending colon, diet to [place] descending and sigmoid colon. Placed on pantoprazole twice daily with Carafate, due to patient's COPD and bronchiectasis repeat upper endoscopy was not pursued due to clinical improvement  Patient presented to ER with hemoglobin 12, currently at 9.6.  Normocytic with MCV 93.3.  Patient did have leukocytosis 11.9 on admission currently at 15.  Normal platelets 195. Procalcitonin unremarkable Pending respiratory culture Legionella  Negative troponins, negative lipase INR unremarkable CTA in the ER showed esophageal thickening, possible aspiration or pneumonia.  Calcium 7.7 BUN on admission elevated at 40, with normal creatinine 0.95 currently at 28 with creatinine 0.93. Patient started on Protonix drip, given erythromycin. Currently afebrile, blood pressures stable, has some tachypnea, 93% on room air.  Patient is  in the room with  his kids, who also gives some of the history. Patient states yesterday was not feeling well around noon.  Picked up [place] with a friend ate this without any difficulties and then around 2 PM began to cough up bright red blood.  Patient's been having epigastric pain without radiation, denies nausea. Patient has dyspnea with exertion especially steps but this is unchanged.  Denies fever or chills. Since episode of hemoptysis patient's been having cough/regurgitation with any liquids or food he tries to eat.  Denies any dysphagia prior to this no odynophagia, no pharyngeal dysphagia prior the patient was admitted with likely aspiration pneumonia, currently being treated with Unasyn. Patient states he does have reflux daily especially every time he eats.  Supposed to be on Protonix twice daily but he only takes it in the morning and every other night.  When he does not take the Protonix at night he will take a Carafate but will not take them at the same time. Patient complains of constipation last bowel movement was 2 to 3 days ago, denies any melena or hematochezia. Patient is on low-dose aspirin every other day denies any NSAID use or alcohol.    Abnormal ED labs:   Past Medical History:  Diagnosis Date   Abnormality of gait 06/07/2013   Anemia    Aneurysm ([place])    on assending aorta, currently watching it, Dr Cyndia Bent   Anxiety    Arthritis    Asthma    Atrial fibrillation ([place])    B12 deficiency    Barrett's esophagus    CAD (coronary artery disease) 03/2006   3.0 x 20 mm TAXUS [place] DES to the LAD; 01/2007  L main 30%, oLAD 50%, pLAD stent ok, CFX 80%, OM 60%, pRCA 60%, mRCA 70%, oPDA 90%; med rx    Cancer ([place])    skin cancer    Cataract    bilateral removal of cateracts   COLONIC POLYPS 07/17/2008   Qualifier: Diagnosis of  By: Lovette Cliche, CNA, Christy     Complication of anesthesia     no issues,but pt prefers spinal due to Pulmonary problems   COPD (chronic obstructive pulmonary  disease) ([place])    oxygen  on standby in home.   Depression    Diverticulosis    Emphysema    Enlarged prostate with urinary retention    Esophageal stenosis    Gastric ulcer    GERD (gastroesophageal reflux disease)    Hiatal hernia    Hypothyroidism    Neuropathy    Osteoporosis    Pituitary macroadenoma (H[facility])    Restless legs syndrome (RLS)    Tubular adenoma of colon    UGIB (upper gastrointestinal bleed) 03/2013   EGD w/ large ulcer at GE junction    Surgical History:  He  has a past surgical history that includes Coronary stent placement (03/2006); Hiatal hernia repair (01-04-2008); Foot surgery (1994 left, 2002 right foot); Abdominal hernia repair ( 2008); cataract extraction both eyes; Total hip arthroplasty (07/21/2011); Cardiac catheterization (01/2007); Total hip arthroplasty (Right, 09/07/2012); Foot surgery; Esophagogastroduodenoscopy (N/A, 04/20/2013); Colonoscopy; Upper gastrointestinal endoscopy (12/2013); Cystoscopy with insertion of urolift (N/A, 06/01/2015); Cystoscopy (N/A, 06/10/2015); Prostate surgery; Esophagogastroduodenoscopy (egd) with propofol (N/A, 10/13/2015); Transurethral resection of prostate (N/A, 11/04/2015); Transurethral resection of bladder neck (N/A, 11/04/2015); Esophagogastroduodenoscopy (N/A, 03/25/2016); Savory dilation (N/A, 03/25/2016); Video bronchoscopy (Bilateral, 02/01/2017); Melanoma excision (2019); Polypectomy; Hernia repair; Joint replacement; Coronary angioplasty; Esophagogastroduodenoscopy (egd) with propofol (N/A, 06/13/2020); Colonoscopy with propofol (N/A, 06/13/2020);  biopsy (06/13/2020); and polypectomy (06/13/2020). Family History:  His family history includes Heart disease in his sister; Heart failure in his father; Other in his mother. Social History:   reports that he has never smoked. He has never used smokeless tobacco. He reports that he does not drink alcohol and does not use drugs.  Prior to Admission medications   Medication Sig Start Date  End Date Taking? Authorizing Provider  albuterol (VENTOLIN HFA) 108 (90 Base) MCG/ACT inhaler Inhale 2 puffs into the lungs every 6 (six) hours as needed for wheezing or shortness of breath. 07/14/21  Yes Dettinger, [name], MD  Ascorbic Acid (VITAMIN C) 500 MG CAPS Take 1 capsule by mouth daily.   Yes [provider]  aspirin EC 81 MG tablet Take 1 tablet (81 mg total) by mouth daily. Swallow whole. Patient taking differently: Take 81 mg by mouth every other day. Swallow whole. 05/20/20  Yes Minus Breeding, MD  budesonide-formoterol [facility]) 160-4.5 MCG/ACT inhaler Inhale 2 puffs into the lungs in the morning and at bedtime. 03/05/21  Yes Margaretha Seeds, MD  cabergoline (DOSTINEX) 0.5 MG tablet Take 0.5 tablets (0.25 mg total) by mouth 2 (two) times a week. 09/23/21  Yes Cassandria Anger, MD  cholecalciferol (VITAMIN D) 1000 UNITS tablet Take 1,000 Units by mouth daily.    Yes [provider]  diclofenac Sodium (VOLTAREN) 1 % GEL Apply 2 g topically 4 (four) times daily. 07/14/21  Yes Dettinger, [name], MD  diphenhydramine-acetaminophen (TYLENOL PM) 25-500 MG TABS tablet Take 1/2 tablet by mouth at bedtime as needed for sleep   Yes [provider]  FEROSUL 325 (65 Fe) MG tablet TAKE (1) TABLET DAILY WITH BREAKFAST. Patient taking differently: Take 325 mg by mouth every other day. 02/10/21  Yes Dettinger, [name], MD  GAVILAX 17 GM/SCOOP powder DISSOLVE 17 GM SCOOP IN 8 OZ. OF WATER AND DRINK ONCE DAILY AS NEEDED FOR CONSTIPATION Patient taking differently: Take 17 g by mouth daily as needed for mild constipation. 06/29/20  Yes Pyrtle, Lajuan Lines, MD  guaiFENesin (MUCINEX) 600 MG 12 hr tablet Take 600 mg by mouth daily.   Yes [provider]  levothyroxine (SYNTHROID) 75 MCG tablet Take 1 tablet daily. Patient taking differently: Take 75 mcg by mouth daily before breakfast. 06/25/21  Yes Dettinger, [name], MD  magnesium oxide (MAG-OX) 400 (241.3 Mg) MG tablet  Take 1 tablet (400 mg total) by mouth 2 (two) times daily. Patient taking differently: Take 200 mg by mouth daily as needed (for leg cramps). 06/13/20  Yes Cristal Deer, MD  naphazoline-pheniramine (NAPHCON-A) 0.025-0.3 % ophthalmic solution Place 1 drop into the left eye 2 (two) times daily as needed for irritation or allergies.   Yes [provider]  nitroGLYCERIN (NITROSTAT) 0.4 MG SL tablet Place 1 tablet (0.4 mg total) under the tongue every 5 (five) minutes as needed for chest pain. 07/14/21  Yes Dettinger, [name], MD  pantoprazole (PROTONIX) 40 MG tablet TAKE ONE TABLET TWICE DAILY Patient taking differently: Take 40 mg by mouth See admin instructions. Take '40mg'$  by mouth in the morning then take '40mg'$  every other night 01/11/21  Yes Pyrtle, Lajuan Lines, MD  sucralfate (CARAFATE) 1 g tablet Take 1 tablet three times daily as needed Patient taking differently: Take 1 g by mouth daily as needed (for acid reflux). 11/03/20  Yes Pyrtle, Lajuan Lines, MD  tamsulosin (FLOMAX) 0.4 MG CAPS capsule Take 1 capsule (0.4 mg total) by mouth daily after  supper. 07/14/21  Yes Dettinger, [name], MD  traMADol (ULTRAM) 50 MG tablet Take 1 tablet (50 mg total) by mouth 2 (two) times daily as needed. Patient taking differently: Take 50 mg by mouth 2 (two) times daily as needed for moderate pain. 09/01/21  Yes Aundra Dubin, PA-C  vitamin B-12 (CYANOCOBALAMIN) 1000 MCG tablet Take 1,000 mcg by mouth daily.   Yes [provider]  vitamin E 180 MG (400 UNITS) capsule Take 400 Units by mouth daily.   Yes [provider]  Budeson-Glycopyrrol-Formoterol (BREZTRI AEROSPHERE) 160-9-4.8 MCG/ACT AERO Inhale 2 puffs into the lungs 2 (two) times daily. Patient not taking: Reported on 09/30/2021 03/17/21   Dettinger, [name], MD  psyllium (METAMUCIL) 28 % packet Take 1 packet by mouth daily. Patient taking differently: Take 1 packet by mouth daily as needed (for constipation). 06/07/21   Gwenlyn Perking, FNP   Respiratory Therapy Supplies (FLUTTER) DEVI Twice a day and prn as needed, may increase if feeling worse 05/01/18   Martyn Ehrich, NP  sodium chloride HYPERTONIC 3 % nebulizer solution Take by nebulization as needed for other. Patient taking differently: Take 4 mLs by nebulization daily. 06/18/20   Margaretha Seeds, MD  triamcinolone cream (KENALOG) 0.1 % Apply 1 application topically 2 (two) times daily. Patient taking differently: Apply 1 application  topically 2 (two) times daily as needed (itching). 10/31/18   Baruch Gouty, FNP    Current Facility-Administered Medications  Medication Dose Route Frequency Provider Last Rate Last Admin   0.9 %  sodium chloride infusion   Intravenous Continuous Doutova, Anastassia, MD 10 mL/hr at 10/01/21 0649 Rate Change at 10/01/21 6314   acetaminophen (TYLENOL) tablet 650 mg  650 mg Oral Q6H PRN Toy Baker, MD       Or   acetaminophen (TYLENOL) suppository 650 mg  650 mg Rectal Q6H PRN Doutova, Anastassia, MD       albuterol (PROVENTIL) (2.5 MG/3ML) 0.083% nebulizer solution 2.5 mg  2.5 mg Nebulization Q2H PRN Doutova, Anastassia, MD       Ampicillin-Sulbactam (UNASYN) 3 g in sodium chloride 0.9 % 100 mL IVPB  3 g Intravenous Q6H Shade, Christine E, RPH 200 mL/hr at 10/01/21 0645 Infusion Verify at 10/01/21 0645   [START ON 10/04/2021] cabergoline (DOSTINEX) tablet 0.25 mg  0.25 mg Oral Once per day on Mon Thu Doutova, Anastassia, MD       Chlorhexidine Gluconate Cloth 2 % PADS 6 each  6 each Topical Daily Doutova, Anastassia, MD       levothyroxine (SYNTHROID) tablet 75 mcg  75 mcg Oral Daily Doutova, Anastassia, MD       Oral care mouth rinse  15 mL Mouth Rinse PRN Doutova, Anastassia, MD       tamsulosin (FLOMAX) capsule 0.4 mg  0.4 mg Oral QPC supper Toy Baker, MD        Allergies as of 09/30/2021 - Review Complete 09/30/2021  Allergen Reaction Noted   Gadavist [gadobutrol] Hives 05/18/2018   Prednisone  03/10/2021   Betadine  [povidone iodine] Other (See Comments) 05/28/2015   Lortab [hydrocodone-acetaminophen] Nausea And Vomiting 07/18/2011   Penicillins Other (See Comments)    Zetia [ezetimibe] Other (See Comments) 06/23/2010   Zocor [simvastatin] Nausea Only and Other (See Comments) 06/23/2010    Review of Systems:    Constitutional: No weight loss, fever, chills, weakness or fatigue HEENT: Eyes: No change in vision  Ears, Nose, Throat:  No change in hearing or congestion Skin: No rash or itching Cardiovascular: No chest pain, chest pressure or palpitations   Respiratory: No SOB or cough Gastrointestinal: See HPI and otherwise negative Genitourinary: No dysuria or change in urinary frequency Neurological: No headache, dizziness or syncope Musculoskeletal: No new muscle or joint pain Hematologic: No bleeding or bruising Psychiatric: No history of depression or anxiety     Physical Exam:  Vital signs in last 24 hours: Temp:  [97.3 F (36.3 C)-98.5 F (36.9 C)] 98 F (36.7 C) (08/04 0621) Pulse Rate:  [62-94] 80 (08/04 0621) Resp:  [13-27] 27 (08/04 0621) BP: (104-154)/(53-101) 111/61 (08/04 0515) SpO2:  [91 %-97 %] 93 % (08/04 0621) Weight:  [68.9 kg-70.6 kg] 70.6 kg (08/04 0621)   Last BM recorded by nurses in past 5 days No data recorded  General: Elderly, pleasant 88 year old male Head:  Normocephalic and atraumatic.  Dry oral mucous membranes, poor dentition Eyes: sclerae anicteric,conjunctive pink  Heart:  regular rate and rhythm Pulm: Bilateral lower lobe rhonchi/coarse breath sounds with occasional expiratory wheeze Abdomen:  Soft, Non-distended AB, Sluggish bowel sounds. mild tenderness in the epigastrium. Without guarding and Without rebound, No organomegaly appreciated. Extremities:  Without edema. Msk:  Symmetrical without gross deformities. Peripheral pulses intact.  Neurologic:  Alert and  oriented x4;  No focal deficits.  Skin:   Dry and intact without significant  lesions or rashes. Psychiatric:  Cooperative. Normal mood and affect.  LAB RESULTS: Recent Labs    09/30/21 1740 09/30/21 2103 10/01/21 0435  WBC 11.9* 17.5* 15.0*  HGB 12.0* 11.6* 9.6*  HCT 37.6* 36.4* 29.9*  PLT 248 236 195   BMET Recent Labs    09/30/21 1740 10/01/21 0435  NA 137 141  K 3.8 4.0  CL 108 113*  CO2 23 23  GLUCOSE 133* 116*  BUN 40* 28*  CREATININE 0.95 0.93  CALCIUM 8.5* 7.7*   LFT Recent Labs    10/01/21 0435  PROT 5.4*  ALBUMIN 2.9*  AST 18  ALT 16  ALKPHOS 55  BILITOT 0.6   PT/INR Recent Labs    09/30/21 1740  LABPROT 13.1  INR 1.0    STUDIES: CT Angio Chest/Abd/Pel for Dissection W and/or W/WO  Result Date: 09/30/2021 CLINICAL DATA:  Acute aortic syndrome suspected. Cough with sputum and bright red blood. History of Barrett's esophagus. EXAM: CT ANGIOGRAPHY CHEST, ABDOMEN AND PELVIS TECHNIQUE: Non-contrast CT of the chest was initially obtained. Multidetector CT imaging through the chest, abdomen and pelvis was performed using the standard protocol during bolus administration of intravenous contrast. Multiplanar reconstructed images and MIPs were obtained and reviewed to evaluate the vascular anatomy. RADIATION DOSE REDUCTION: This exam was performed according to the departmental dose-optimization program which includes automated exposure control, adjustment of the mA and/or kV according to patient size and/or use of iterative reconstruction technique. CONTRAST:  12m OMNIPAQUE IOHEXOL 350 MG/ML SOLN COMPARISON:  CT chest 06/09/2020.  CT abdomen and pelvis 10/05/2018 FINDINGS: CTA CHEST FINDINGS Cardiovascular: Noncontrast images of the chest demonstrate calcification in the aorta and coronary arteries. No evidence of intramural hematoma. Images obtained during the arterial phase after contrast material injection demonstrates an ascending aortic aneurysm measuring 4.2 cm in diameter, unchanged since prior study. No evidence of aortic dissection.  Calcified and noncalcified plaque formation mostly in the descending aorta. Great vessel origins are patent. Central pulmonary arteries appear patent without evidence of significant pulmonary embolus. Normal heart size. No pericardial effusions. Mediastinum/Nodes: Thyroid  gland is atrophic. No significant lymphadenopathy in the chest. The esophagus is diffusely distended and thick-walled with internal debris. This is likely related to the patient's history of esophagitis with Barretts esophagus. The degree of esophageal distention is increased since the prior study and the possibility of a developing obstructing lesion should be considered. Suggest follow-up with gastroenterology for consideration of endoscopy. Lungs/Pleura: Emphysematous changes in the lungs. Mild infiltration in the lung bases. Bronchiectasis with opacification of lower lobe bronchi, possibly secretions or aspiration. Apical pleural calcifications are greater on the left, likely postinflammatory. No pleural effusions. Musculoskeletal: Old rib fractures. Compression of the T8 vertebral body is unchanged since prior study. Review of the MIP images confirms the above findings. CTA ABDOMEN AND PELVIS FINDINGS VASCULAR Aorta: Normal caliber aorta without aneurysm, dissection, vasculitis or significant stenosis. Diffuse aortic calcification. Celiac: Celiac axis supplies the splenic artery and left gastric artery. Vessels appear patent without evidence of aneurysm, occlusion, or dissection. SMA: The hepatic artery arises from the superior mesenteric artery. Both vessels are patent without evidence of aneurysm, occlusion, or dissection. Renals: Both renal arteries are patent without evidence of aneurysm, dissection, vasculitis, fibromuscular dysplasia or significant stenosis. IMA: Patent without evidence of aneurysm, dissection, vasculitis or significant stenosis. Inflow: Patent without evidence of aneurysm, dissection, vasculitis or significant stenosis.  Veins: No obvious venous abnormality within the limitations of this arterial phase study. Review of the MIP images confirms the above findings. NON-VASCULAR Hepatobiliary: No focal liver abnormality is seen. No gallstones, gallbladder wall thickening, or biliary dilatation. Pancreas: Unremarkable. No pancreatic ductal dilatation or surrounding inflammatory changes. Spleen: Normal in size without focal abnormality. Adrenals/Urinary Tract: Adrenal glands are unremarkable. Kidneys are normal, without renal calculi, focal lesion, or hydronephrosis. Bladder is unremarkable. Stomach/Bowel: Stomach, small bowel, and colon are not abnormally distended. Stool fills the colon. No wall thickening or inflammatory changes are appreciated. Appendix is normal. Lymphatic: No significant lymphadenopathy. Reproductive: Prostate gland is not enlarged. Visualization is limited due to streak artifact from hip prostheses. Other: No free air or free fluid in the abdomen. Abdominal wall musculature appears intact. Musculoskeletal: Bilateral total hip arthroplasties. Degenerative changes in the lumbar spine. Review of the MIP images confirms the above findings. IMPRESSION: 1. Diffuse aortic atherosclerosis. No evidence of aortic dissection. 2. 4.2 cm diameter ascending thoracic aortic aneurysm. Recommend annual imaging followup by CTA or MRA. This recommendation follows 2010 ACCF/AHA/AATS/ACR/ASA/SCA/SCAI/SIR/STS/SVM Guidelines for the Diagnosis and Management of Patients with Thoracic Aortic Disease. Circulation. 2010; 121: W413-K440. Aortic aneurysm NOS (ICD10-I71.9) 3. Bronchiectasis with mucous plugging versus secretions in the lung bases as well as peribronchial infiltrates. Changes may represent aspiration or pneumonia. 4. Increased distention and thick-walled appearance of the esophagus since prior study. Changes likely related to history of Barrett's esophagus and esophagitis. Consider gastroenterology referral to exclude  obstructing lesion. Electronically Signed   By: Lucienne Capers M.D.   On: 09/30/2021 19:40   DG Chest Portable 1 View  Result Date: 09/30/2021 CLINICAL DATA:  Cough, hemoptysis EXAM: PORTABLE CHEST 1 VIEW COMPARISON:  06/28/2021 FINDINGS: Cardiac size is within normal limits. There are no signs of pulmonary edema. Left hemidiaphragm is elevated. There is interval clearing of linear density in the lateral aspect of the right lower lung field. New linear densities are seen in medial right lower lung field. There is no significant pleural effusion or pneumothorax. There are multiple old healed fractures in the left ribs. IMPRESSION: Small new linear densities in the medial right lower lung fields suggest subsegmental atelectasis. There are  no signs of pulmonary edema or focal pulmonary consolidation. Electronically Signed   By: Elmer Picker M.D.   On: 09/30/2021 18:31     Vladimir Crofts  10/01/2021, 8:01 AM  ----------------------------------------------------------------  I have taken a history, reviewed the chart and examined the patient. I performed a substantive portion of this encounter, including complete performance of at least one of the key components, in conjunction with the APP. I agree with the APP's note, impression and recommendations  88 year old male with bronchiectasis, A-fib and coronary artery disease and history of GERD with esophageal stricture and history of severe reflux esophagitis, who presented with chest discomfort and large-volume hematemesis, resulting in four-point drop in hemoglobin.  His presentation was initially concerning for dissecting aorta, but CT a ruled this out.  His bleeding appears to have slowed down quite a bit, but now the patient is unable to eat, and can only drink small amounts. I suspect the patient may have bled again from and esophageal ulcer, and I wonder if his dysphagia is now due to large clot burden at the level of his esophageal  stricture. We will plan to perform an upper endoscopy tomorrow to evaluate source of bleeding and potentially improve dysphagia, although if his dysphagia is from a significant stricture, I would not feel comfortable dilating in the setting of a GI bleed. I had a long discussion with the patient and his son/daughter-in-law regarding the risks and benefits of upper endoscopy.  He is certainly high risk for sedation, but given the severity of his symptoms, I think an upper endoscopy is absolutely necessary at this point. We will plan for EGD tomorrow morning. Continue Protonix IV, clear liquid diet as tolerated  Barrie Sigmund E. Candis Schatz, MD [facility]

## 2021-10-01 NOTE — Evaluation (Signed)
Physical Therapy Evaluation Patient Details Name: [name] MRN: [id number] DOB: December 04, 1932 Today's Date: 10/01/2021  History of Present Illness  88 y.o. male with medical history significant of GERD Berretts esophagus, pituitary microadenoma who presented with hemoptysis versus hematemesis. PMH: CAD, neuropathy, COPD. Bil THA,.  Clinical Impression  The patient is very pleasant. Ambulated x 120' using Rw.  Patient resides alone with family  support, drives, uses a cane or RW.  Patient reports having several falls in recent past.  Patient's family present and reports patient will have support as needed.  Pt admitted with above diagnosis.  Pt currently with functional limitations due to the deficits listed below (see PT Problem List). Pt will benefit from skilled PT to increase their independence and safety with mobility to allow discharge to the venue listed below.           Recommendations for follow up therapy are one component of a multi-disciplinary discharge planning process, led by the attending physician.  Recommendations may be updated based on patient status, additional functional criteria and insurance authorization.  Follow Up Recommendations Home health PT      Assistance Recommended at Discharge Intermittent Supervision/Assistance  Patient can return home with the following  A little help with bathing/dressing/bathroom;Help with stairs or ramp for entrance;Assistance with cooking/housework;Assist for transportation    Equipment Recommendations None recommended by PT  Recommendations for Other Services       Functional Status Assessment Patient has had a recent decline in their functional status and demonstrates the ability to make significant improvements in function in a reasonable and predictable amount of time.     Precautions / Restrictions Precautions Precautions: Fall Precaution Comments: reports several falls      Mobility  Bed Mobility Overal bed  mobility: Needs Assistance Bed Mobility: Supine to Sit     Supine to sit: Supervision     General bed mobility comments: manage lines    Transfers Overall transfer level: Needs assistance Equipment used: Rolling walker (2 wheels) Transfers: Sit to/from Stand Sit to Stand: Min guard           General transfer comment: cues for safety, wait for lines  managed    Ambulation/Gait Ambulation/Gait assistance: Min guard Gait Distance (Feet): 120 Feet Assistive device: Rolling walker (2 wheels) Gait Pattern/deviations: Step-through pattern       General Gait Details: slow speed  Stairs            Wheelchair Mobility    Modified Rankin (Stroke Patients Only)       Balance Overall balance assessment: Needs assistance, History of Falls Sitting-balance support: Feet supported, No upper extremity supported Sitting balance-Leahy Scale: Good     Standing balance support: During functional activity, Bilateral upper extremity supported Standing balance-Leahy Scale: Fair                               Pertinent Vitals/Pain Pain Assessment Pain Assessment: No/denies pain    Home Living Family/patient expects to be discharged to:: Private residence Living Arrangements: Alone Available Help at Discharge: Family;Available PRN/intermittently Type of Home: House Home Access: Stairs to enter;Ramped entrance Entrance Stairs-Rails: Right;Left Entrance Stairs-Number of Steps: 4 Alternate Level Stairs-Number of Steps: basement Home Layout: One level Home Equipment: Cane - single point;Rollator (4 wheels);Rolling Walker (2 wheels);Grab bars - tub/shower Additional Comments: has a walk in tub if needed, family states  patient will have assistance as needed.  Prior Function Prior Level of Function : Independent/Modified Independent             Mobility Comments: uses a cane ADLs Comments: independent with ADLs, drives, does household chores     Hand  Dominance   Dominant Hand: Right    Extremity/Trunk Assessment        Lower Extremity Assessment Lower Extremity Assessment: Generalized weakness    Cervical / Trunk Assessment Cervical / Trunk Assessment: Kyphotic  Communication   Communication: No difficulties  Cognition Arousal/Alertness: Awake/alert Behavior During Therapy: WFL for tasks assessed/performed Overall Cognitive Status: Within Functional Limits for tasks assessed                                          General Comments      Exercises     Assessment/Plan    PT Assessment Patient needs continued PT services  PT Problem List Decreased strength;Decreased mobility;Decreased safety awareness;Decreased knowledge of precautions;Decreased activity tolerance       PT Treatment Interventions DME instruction;Therapeutic activities;Gait training;Therapeutic exercise;Patient/family education;Functional mobility training    PT Goals (Current goals can be found in the Care Plan section)  Acute Rehab PT Goals Patient Stated Goal: go home PT Goal Formulation: With patient/family Time For Goal Achievement: 10/15/21 Potential to Achieve Goals: Good    Frequency Min 3X/week     Co-evaluation               AM-PAC PT "6 Clicks" Mobility  Outcome Measure Help needed turning from your back to your side while in a flat bed without using bedrails?: None Help needed moving from lying on your back to sitting on the side of a flat bed without using bedrails?: A Little Help needed moving to and from a bed to a chair (including a wheelchair)?: A Little Help needed standing up from a chair using your arms (e.g., wheelchair or bedside chair)?: A Little Help needed to walk in hospital room?: A Little Help needed climbing 3-5 steps with a railing? : A Lot 6 Click Score: 18    End of Session Equipment Utilized During Treatment: Gait belt Activity Tolerance: Patient tolerated treatment well Patient  left: in chair;with call bell/phone within reach;with chair alarm set;with family/visitor present Nurse Communication: Mobility status PT Visit Diagnosis: Unsteadiness on feet (R26.81);Repeated falls (R29.6)    Time: 0867-6195 PT Time Calculation (min) (ACUTE ONLY): 20 min   Charges:   PT Evaluation $PT Eval Low Complexity: 1 Low          [place] Office [phone] Weekend [phone]   Claretha Cooper 10/01/2021, 1:07 PM

## 2021-10-01 NOTE — Plan of Care (Signed)
Will discuss plan of care, pain management and admission question with some teach back.   Problem: Education: Goal: Knowledge of General Education information will improve Description: Including pain rating scale, medication(s)/side effects and non-pharmacologic comfort measures Outcome: Progressing   Problem: Health Behavior/Discharge Planning: Goal: Ability to manage health-related needs will improve Outcome: Progressing

## 2021-10-02 ENCOUNTER — Inpatient Hospital Stay (HOSPITAL_COMMUNITY): Payer: Medicare Other | Admitting: Anesthesiology

## 2021-10-02 ENCOUNTER — Encounter (HOSPITAL_COMMUNITY)
Admission: EM | Disposition: A | Discharge status: Home Health | Payer: Self-pay | Source: Home / Self Care | Attending: Internal Medicine

## 2021-10-02 DIAGNOSIS — K2211 Ulcer of esophagus with bleeding: Secondary | ICD-10-CM

## 2021-10-02 DIAGNOSIS — K2091 Esophagitis, unspecified with bleeding: Secondary | ICD-10-CM

## 2021-10-02 DIAGNOSIS — K922 Gastrointestinal hemorrhage, unspecified: Secondary | ICD-10-CM | POA: Diagnosis not present

## 2021-10-02 DIAGNOSIS — K2289 Other specified disease of esophagus: Secondary | ICD-10-CM

## 2021-10-02 DIAGNOSIS — E039 Hypothyroidism, unspecified: Secondary | ICD-10-CM | POA: Diagnosis not present

## 2021-10-02 DIAGNOSIS — K449 Diaphragmatic hernia without obstruction or gangrene: Secondary | ICD-10-CM

## 2021-10-02 DIAGNOSIS — J690 Pneumonitis due to inhalation of food and vomit: Secondary | ICD-10-CM | POA: Diagnosis not present

## 2021-10-02 HISTORY — PX: HOT HEMOSTASIS: SHX5433

## 2021-10-02 HISTORY — PX: ESOPHAGOGASTRODUODENOSCOPY (EGD) WITH PROPOFOL: SHX5813

## 2021-10-02 LAB — BASIC METABOLIC PANEL
Anion gap: 4 — ABNORMAL LOW (ref 5–15)
BUN: 19 mg/dL (ref 8–23)
CO2: 23 mmol/L (ref 22–32)
Calcium: 7.7 mg/dL — ABNORMAL LOW (ref 8.9–10.3)
Chloride: 116 mmol/L — ABNORMAL HIGH (ref 98–111)
Creatinine, Ser: 0.95 mg/dL (ref 0.61–1.24)
GFR, Estimated: >60 mL/min (ref >60)
Glucose, Bld: 86 mg/dL (ref 70–99)
Potassium: 3.6 mmol/L (ref 3.5–5.1)
Sodium: 143 mmol/L (ref 135–145)

## 2021-10-02 LAB — CBC
HCT: 28.1 % — ABNORMAL LOW (ref 39.0–52.0)
Hemoglobin: 8.7 g/dL — ABNORMAL LOW (ref 13.0–17.0)
MCH: 30.1 pg (ref 26.0–34.0)
MCHC: 31 g/dL (ref 30.0–36.0)
MCV: 97.2 fL (ref 80.0–100.0)
Platelets: 177 10*3/uL (ref 150–400)
RBC: 2.89 MIL/uL — ABNORMAL LOW (ref 4.22–5.81)
RDW: 16.3 % — ABNORMAL HIGH (ref 11.5–15.5)
WBC: 11.9 10*3/uL — ABNORMAL HIGH (ref 4.0–10.5)
nRBC: 0 % (ref 0.0–0.2)

## 2021-10-02 LAB — PROCALCITONIN: Procalcitonin: <0.1 ng/mL

## 2021-10-02 LAB — MAGNESIUM: Magnesium: 2 mg/dL (ref 1.7–2.4)

## 2021-10-02 LAB — HEMOGLOBIN AND HEMATOCRIT, BLOOD
HCT: 28 % — ABNORMAL LOW (ref 39.0–52.0)
Hemoglobin: 8.9 g/dL — ABNORMAL LOW (ref 13.0–17.0)

## 2021-10-02 SURGERY — ESOPHAGOGASTRODUODENOSCOPY (EGD) WITH PROPOFOL
Anesthesia: Monitor Anesthesia Care

## 2021-10-02 MED ORDER — POTASSIUM CHLORIDE 10 MEQ/100ML IV SOLN
10.0000 meq | Freq: Once | INTRAVENOUS | Status: AC
  Administered 2021-10-02: 10 meq via INTRAVENOUS

## 2021-10-02 MED ORDER — LIDOCAINE 2% (20 MG/ML) 5 ML SYRINGE
INTRAMUSCULAR | Status: DC | PRN
  Administered 2021-10-02: 80 mg via INTRAVENOUS

## 2021-10-02 MED ORDER — PHENYLEPHRINE 80 MCG/ML (10ML) SYRINGE FOR IV PUSH (FOR BLOOD PRESSURE SUPPORT)
PREFILLED_SYRINGE | INTRAVENOUS | Status: DC | PRN
  Administered 2021-10-02 (×3): 80 ug via INTRAVENOUS
  Administered 2021-10-02: 120 ug via INTRAVENOUS
  Administered 2021-10-02: 80 ug via INTRAVENOUS

## 2021-10-02 MED ORDER — POTASSIUM CHLORIDE 10 MEQ/100ML IV SOLN
10.0000 meq | INTRAVENOUS | Status: AC
  Administered 2021-10-02 (×3): 10 meq via INTRAVENOUS
  Filled 2021-10-02 (×4): qty 100

## 2021-10-02 MED ORDER — SUCRALFATE 1 GM/10ML PO SUSP
1.0000 g | Freq: Three times a day (TID) | ORAL | Status: DC
  Administered 2021-10-02 – 2021-10-05 (×13): 1 g via ORAL
  Filled 2021-10-02 (×13): qty 10

## 2021-10-02 MED ORDER — ACETYLCYSTEINE 20 % IN SOLN
600.0000 mg | Freq: Once | RESPIRATORY_TRACT | Status: AC
  Administered 2021-10-02: 600 mg via RESPIRATORY_TRACT
  Filled 2021-10-02: qty 4

## 2021-10-02 MED ORDER — PROPOFOL 500 MG/50ML IV EMUL
INTRAVENOUS | Status: AC
  Filled 2021-10-02: qty 50

## 2021-10-02 MED ORDER — PROPOFOL 500 MG/50ML IV EMUL
INTRAVENOUS | Status: DC | PRN
  Administered 2021-10-02: 100 ug/kg/min via INTRAVENOUS

## 2021-10-02 MED ORDER — LACTATED RINGERS IV SOLN: INTRAVENOUS | Status: DC

## 2021-10-02 MED ORDER — LACTATED RINGERS IV SOLN: INTRAVENOUS | Status: DC | PRN

## 2021-10-02 MED ORDER — PROPOFOL 10 MG/ML IV BOLUS
INTRAVENOUS | Status: DC | PRN
  Administered 2021-10-02: 20 mg via INTRAVENOUS

## 2021-10-02 SURGICAL SUPPLY — 15 items

## 2021-10-02 NOTE — Anesthesia Procedure Notes (Signed)
Procedure Name: MAC Date/Time: 10/02/2021 8:46 AM  Performed by: Eben Burow, CRNAPre-anesthesia Checklist: Patient identified, Emergency Drugs available, Suction available, Patient being monitored and Timeout performed Oxygen Delivery Method: Simple face mask Placement Confirmation: positive ETCO2

## 2021-10-02 NOTE — Interval H&P Note (Signed)
History and Physical Interval Note:  10/02/2021 8:46 AM  [name]  has presented today for surgery, with the diagnosis of Anemia, hematemesis, abnormal CT.  The various methods of treatment have been discussed with the patient and family. After consideration of risks, benefits and other options for treatment, the patient has consented to  Procedure(s): ESOPHAGOGASTRODUODENOSCOPY (EGD) WITH PROPOFOL (N/A) as a surgical intervention.  The patient's history has been reviewed, patient examined, no change in status, stable for surgery.  I have reviewed the patient's chart and labs.  Questions were answered to the patient's satisfaction.    No further hematemesis overnight, hgb stable.  Dysphagia minimally improved (able to swallow a little bit of water this morning)  [name]

## 2021-10-02 NOTE — Progress Notes (Signed)
[facility] PROGRESS NOTE   [name] RXV:[id number] DOB: [birth year] DOA: 09/30/2021  PCP: Dettinger, [name], MD  Brief History/Interval Summary: 88 y.o. male with medical history significant of GERD Berretts esophagus, pituitary microadenoma who presented with hemoptysis versus hematemesis.  It looks like initially patient did have episodes of hematemesis which was followed by episodes of hemoptysis.  Patient was hospitalized for further management.  Consultants: Gastroenterology  Procedures: EGD is planned for today    Subjective/Interval History: Patient mentions that he is feeling slightly better today compared to yesterday though he continues to cough up blood.  No nausea vomiting this morning.  No abdominal pain.  No shortness of breath.  His son is at the bedside.    Assessment/Plan:  Upper GI bleed/dysphagia There was some confusion initially whether patient had hematemesis or hemoptysis.  Looks like patient initially with vomiting of blood and then this was followed by hemoptysis.  I think he originally had upper GI bleed.  Patient not on any anticoagulants.   Gastroenterology consulted.  Plan is for EGD this morning.   Continue with PPI infusion.   Last upper endoscopy was in April 2022 which showed esophagitis and hiatal hernia. CT angiogram showed distention and thick-walled appearance of esophagus.  Patient mentions that he has had esophageal dilatation previously.  Acute blood loss anemia Presented with hemoglobin of 12.  Been dropped to 8.7 as of this morning.  Continue to monitor.  Transfuse if it drops below 7.    Aspiration pneumonia/hemoptysis Hemoptysis likely from blood that he may have aspirated after his episodes of hematemesis.   CT angiogram did show just changes of bronchiectasis with mucous plugging.  Concern for aspiration pneumonia.  Patient started on Unasyn.  Respiratory status stable.  Procalcitonin noted to be less than  0.1.  History of atrial fibrillation, paroxysmal Aspirin on hold.  Not on anticoagulation.  Replace potassium.  Magnesium is 2.0.  History of COPD Stable.  BPH Continue Flomax.  Pituitary adenoma Followed by endocrinology as outpatient.  Noted to be on cabergoline.  Thoracic aortic aneurysm No evidence for dissection on CT angiogram.  Outpatient monitoring.  Hypothyroidism Continue levothyroxine.  DVT Prophylaxis: SCDs Code Status: Full code Family Communication: Discussed with patient and his family Disposition Plan: Hopefully return home when improved  Status is: Inpatient Remains inpatient appropriate because: Upper GI bleed    Medications: Scheduled:  [MAR Hold] cabergoline  0.25 mg Oral Once per day on Mon Thu   [facility] Hold] Chlorhexidine Gluconate Cloth  6 each Topical Daily   [MAR Hold] fluticasone furoate-vilanterol  1 puff Inhalation Daily   And   [MAR Hold] umeclidinium bromide  1 puff Inhalation Daily   [MAR Hold] levothyroxine  75 mcg Oral Daily   [MAR Hold] pantoprazole  40 mg Intravenous Q12H   [MAR Hold] tamsulosin  0.4 mg Oral QPC supper   Continuous:  sodium chloride 50 mL/hr at 10/02/21 0147   [MAR Hold] ampicillin-sulbactam (UNASYN) IV 200 mL/hr at 10/02/21 0533   lactated ringers 10 mL/hr at 10/02/21 0756   pantoprazole Stopped (10/02/21 0531)   [MAR Hold] potassium chloride     PRN:[MAR Hold] acetaminophen **OR** [MAR Hold] acetaminophen, [MAR Hold] albuterol, [MAR Hold] mouth rinse  Antibiotics: Anti-infectives (From admission, onward)    Start     Dose/Rate Route Frequency Ordered Stop   09/30/21 2200  [MAR Hold]  Ampicillin-Sulbactam (UNASYN) 3 g in sodium chloride 0.9 % 100 mL IVPB        (  MAR Hold since Sat 10/02/2021 at 0745.Hold Reason: Transfer to a Procedural area)   3 g 200 mL/hr over 30 Minutes Intravenous Every 6 hours 09/30/21 2146     09/30/21 1830  erythromycin 250 mg in sodium chloride 0.9 % 100 mL IVPB        250 mg 100 mL/hr  over 60 Minutes Intravenous  Once 09/30/21 1801 09/30/21 2133   09/30/21 1800  erythromycin 205 mg in sodium chloride 0.9 % 100 mL IVPB  Status:  Discontinued        3 mg/kg  68.9 kg 100 mL/hr over 60 Minutes Intravenous  Once 09/30/21 1749 09/30/21 1801       Objective:  Vital Signs  Vitals:   10/02/21 0400 10/02/21 0408 10/02/21 0500 10/02/21 0745  BP: (!) 158/55   (!) 136/50  Pulse: 88  93 67  Resp: (!) '27  20 17  '$ Temp:    98 F (36.7 C)  TempSrc:    Oral  SpO2: 98% 99% 97% 96%  Weight:      Height:        Intake/Output Summary (Last 24 hours) at 10/02/2021 0857 Last data filed at 10/02/2021 0533 Gross per 24 hour  Intake 1463.17 ml  Output 650 ml  Net 813.17 ml    Filed Weights   09/30/21 1641 10/01/21 0621  Weight: 68.9 kg 70.6 kg    General appearance: Awake alert.  In no distress Resp: Clear to auscultation bilaterally.  Normal effort Cardio: S1-S2 is normal regular.  No S3-S4.  No rubs murmurs or bruit GI: Abdomen is soft.  Mildly tender in the epigastric area without any rebound rigidity or guarding.  No masses organomegaly. Extremities: No edema.  Full range of motion of lower extremities. Neurologic: No focal neurological deficits.     Lab Results:  Data Reviewed: I have personally reviewed following labs and reports of the imaging studies  CBC: Recent Labs  Lab 09/30/21 1740 09/30/21 2103 10/01/21 0435 10/01/21 1013 10/01/21 1847 10/02/21 0247  WBC 11.9* 17.5* 15.0* 14.2*  --  11.9*  NEUTROABS 8.8*  --   --   --   --   --   HGB 12.0* 11.6* 9.6* 9.4* 9.2* 8.7*  HCT 37.6* 36.4* 29.9* 29.0* 29.1* 28.1*  MCV 93.3 94.1 94.9 94.5  --  97.2  PLT 248 236 195 184  --  177     Basic Metabolic Panel: Recent Labs  Lab 09/30/21 1740 09/30/21 2103 10/01/21 0435 10/02/21 0247  NA 137  --  141 143  K 3.8  --  4.0 3.6  CL 108  --  113* 116*  CO2 23  --  23 23  GLUCOSE 133*  --  116* 86  BUN 40*  --  28* 19  CREATININE 0.95  --  0.93 0.95   CALCIUM 8.5*  --  7.7* 7.7*  MG  --  2.0  --  2.0  PHOS  --  2.4*  --   --      GFR: Estimated Creatinine Clearance: 51.2 mL/min (by C-G formula based on SCr of 0.95 mg/dL).  Liver Function Tests: Recent Labs  Lab 09/30/21 1740 10/01/21 0435  AST 23 18  ALT 21 16  ALKPHOS 74 55  BILITOT 0.5 0.6  PROT 7.0 5.4*  ALBUMIN 3.8 2.9*     Recent Labs  Lab 09/30/21 1740  LIPASE 35     Coagulation Profile: Recent Labs  Lab 09/30/21 1740  INR 1.0  Cardiac Enzymes: Recent Labs  Lab 09/30/21 2103  CKTOTAL 85      CBG: Recent Labs  Lab 09/30/21 1701  GLUCAP 136*      Thyroid Function Tests: Recent Labs    09/30/21 2103  TSH 2.622      Recent Results (from the past 240 hour(s))  Expectorated Sputum Assessment w Gram Stain, Rflx to Resp Cult     Status: None   Collection Time: 10/01/21  2:41 AM   Specimen: Expectorated Sputum  Result Value Ref Range Status   Specimen Description EXPECTORATED SPUTUM  Final   Special Requests NONE  Final   Sputum evaluation   Final    THIS SPECIMEN IS ACCEPTABLE FOR SPUTUM CULTURE Performed at [facility], [place] [address], [place], [place] [address]    Report Status 10/01/2021 FINAL  Final  Culture, Respiratory w Gram Stain     Status: None (Preliminary result)   Collection Time: 10/01/21  2:41 AM  Result Value Ref Range Status   Specimen Description   Final    EXPECTORATED SPUTUM Performed at [place] [address], [place], [place] [address]    Special Requests   Final    NONE Reflexed from [id number] Performed at [facility], [place] [address], [place], [place] [address]    Gram Stain   Final    FEW SQUAMOUS EPITHELIAL CELLS PRESENT FEW WBC PRESENT, PREDOMINANTLY MONONUCLEAR FEW GRAM NEGATIVE RODS FEW GRAM POSITIVE COCCI IN PAIRS Performed at [facility], [place] [address], [place], [place] [address]    Culture PENDING  Incomplete    Report Status PENDING  Incomplete  MRSA Next Gen by PCR, Nasal     Status: None   Collection Time: 10/01/21  4:30 AM   Specimen: Nasal Mucosa; Nasal Swab  Result Value Ref Range Status   MRSA by PCR Next Gen NOT DETECTED NOT DETECTED Final    Comment: (NOTE) The GeneXpert MRSA Assay (FDA approved for NASAL specimens only), is one component of a comprehensive MRSA colonization surveillance program. It is not intended to diagnose MRSA infection nor to guide or monitor treatment for MRSA infections. Test performance is not FDA approved in patients less than 42 years old. Performed at [facility], [place] [address], [place], [place] [address]       Radiology Studies: ECHOCARDIOGRAM COMPLETE  Result Date: 10/01/2021    ECHOCARDIOGRAM REPORT   Patient Name:   DELROY ORDWAY Recchia Date of Exam: 10/01/2021 Medical Rec #:  [id number]       Height:       67.0 in Accession #:    [id number]      Weight:       155.6 lb Date of Birth:  04/09/1932       BSA:          1.818 m Patient Age:    62 years        BP:           98/51 mmHg Patient Gender: M               HR:           66 bpm. Exam Location:  Inpatient Procedure: 2D Echo, Color Doppler and Cardiac Doppler Indications:    R07.9* Chest pain, unspecified  History:        Patient has prior history of Echocardiogram examinations, most  recent 06/14/2015. CAD, COPD, Arrythmias:Atrial Fibrillation;                 Risk Factors:Dyslipidemia.  Sonographer:    Raquel Sarna Senior RDCS Referring Phys: [place]  1. Left ventricular ejection fraction, by estimation, is 60 to 65%. The left ventricle has normal function. The left ventricle has no regional wall motion abnormalities. Left ventricular diastolic parameters are consistent with Grade II diastolic dysfunction (pseudonormalization).  2. Right ventricular systolic function is normal. The right ventricular size is normal. There is normal pulmonary artery systolic  pressure. The estimated right ventricular systolic pressure is 20.9 mmHg.  3. Left atrial size was mildly dilated.  4. Right atrial size was mildly dilated.  5. The mitral valve is normal in structure. Mild mitral valve regurgitation. No evidence of mitral stenosis.  6. The aortic valve is tricuspid. Aortic valve regurgitation is trivial. No aortic stenosis is present.  7. Aortic dilatation noted. There is mild dilatation of the aortic root, measuring 38 mm.  8. The inferior vena cava is normal in size with greater than 50% respiratory variability, suggesting right atrial pressure of 3 mmHg. FINDINGS  Left Ventricle: Left ventricular ejection fraction, by estimation, is 60 to 65%. The left ventricle has normal function. The left ventricle has no regional wall motion abnormalities. The left ventricular internal cavity size was normal in size. There is  no left ventricular hypertrophy. Left ventricular diastolic parameters are consistent with Grade II diastolic dysfunction (pseudonormalization). Right Ventricle: The right ventricular size is normal. No increase in right ventricular wall thickness. Right ventricular systolic function is normal. There is normal pulmonary artery systolic pressure. The tricuspid regurgitant velocity is 2.61 m/s, and  with an assumed right atrial pressure of 3 mmHg, the estimated right ventricular systolic pressure is 47.0 mmHg. Left Atrium: Left atrial size was mildly dilated. Right Atrium: Right atrial size was mildly dilated. Pericardium: There is no evidence of pericardial effusion. Mitral Valve: The mitral valve is normal in structure. Mild mitral valve regurgitation. No evidence of mitral valve stenosis. Tricuspid Valve: The tricuspid valve is normal in structure. Tricuspid valve regurgitation is trivial. Aortic Valve: The aortic valve is tricuspid. Aortic valve regurgitation is trivial. No aortic stenosis is present. Pulmonic Valve: The pulmonic valve was normal in structure.  Pulmonic valve regurgitation is not visualized. Aorta: Aortic dilatation noted. There is mild dilatation of the aortic root, measuring 38 mm. Venous: The inferior vena cava is normal in size with greater than 50% respiratory variability, suggesting right atrial pressure of 3 mmHg. IAS/Shunts: No atrial level shunt detected by color flow Doppler.  LEFT VENTRICLE PLAX 2D LVIDd:         4.20 cm   Diastology LVIDs:         2.00 cm   LV e' medial:    7.18 cm/s LV PW:         0.80 cm   LV E/e' medial:  9.7 LV IVS:        1.10 cm   LV e' lateral:   9.14 cm/s LVOT diam:     2.10 cm   LV E/e' lateral: 7.6 LV SV:         79 LV SV Index:   44 LVOT Area:     3.46 cm  RIGHT VENTRICLE RV S prime:     14.10 cm/s TAPSE (M-mode): 2.3 cm LEFT ATRIUM             Index  RIGHT ATRIUM           Index LA diam:        3.30 cm 1.82 cm/m   RA Area:     19.30 cm LA Vol (A2C):   93.0 ml 51.16 ml/m  RA Volume:   46.90 ml  25.80 ml/m LA Vol (A4C):   41.5 ml 22.83 ml/m LA Biplane Vol: 61.9 ml 34.05 ml/m  AORTIC VALVE LVOT Vmax:   94.60 cm/s LVOT Vmean:  65.000 cm/s LVOT VTI:    0.229 m  AORTA Ao Root diam: 3.80 cm Ao Asc diam:  3.70 cm MITRAL VALVE               TRICUSPID VALVE MV Area (PHT): 3.17 cm    TR Peak grad:   27.2 mmHg MV Decel Time: 239 msec    TR Vmax:        261.00 cm/s MV E velocity: 69.40 cm/s MV A velocity: 63.00 cm/s  SHUNTS MV E/A ratio:  1.10        Systemic VTI:  0.23 m                            Systemic Diam: 2.10 cm Dalton McleanMD Electronically signed by Franki Monte Signature Date/Time: 10/01/2021/2:07:07 PM    Final    CT Angio Chest/Abd/Pel for Dissection W and/or W/WO  Result Date: 09/30/2021 CLINICAL DATA:  Acute aortic syndrome suspected. Cough with sputum and bright red blood. History of Barrett's esophagus. EXAM: CT ANGIOGRAPHY CHEST, ABDOMEN AND PELVIS TECHNIQUE: Non-contrast CT of the chest was initially obtained. Multidetector CT imaging through the chest, abdomen and pelvis was performed  using the standard protocol during bolus administration of intravenous contrast. Multiplanar reconstructed images and MIPs were obtained and reviewed to evaluate the vascular anatomy. RADIATION DOSE REDUCTION: This exam was performed according to the departmental dose-optimization program which includes automated exposure control, adjustment of the mA and/or kV according to patient size and/or use of iterative reconstruction technique. CONTRAST:  173m OMNIPAQUE IOHEXOL 350 MG/ML SOLN COMPARISON:  CT chest 06/09/2020.  CT abdomen and pelvis 10/05/2018 FINDINGS: CTA CHEST FINDINGS Cardiovascular: Noncontrast images of the chest demonstrate calcification in the aorta and coronary arteries. No evidence of intramural hematoma. Images obtained during the arterial phase after contrast material injection demonstrates an ascending aortic aneurysm measuring 4.2 cm in diameter, unchanged since prior study. No evidence of aortic dissection. Calcified and noncalcified plaque formation mostly in the descending aorta. Great vessel origins are patent. Central pulmonary arteries appear patent without evidence of significant pulmonary embolus. Normal heart size. No pericardial effusions. Mediastinum/Nodes: Thyroid gland is atrophic. No significant lymphadenopathy in the chest. The esophagus is diffusely distended and thick-walled with internal debris. This is likely related to the patient's history of esophagitis with Barretts esophagus. The degree of esophageal distention is increased since the prior study and the possibility of a developing obstructing lesion should be considered. Suggest follow-up with gastroenterology for consideration of endoscopy. Lungs/Pleura: Emphysematous changes in the lungs. Mild infiltration in the lung bases. Bronchiectasis with opacification of lower lobe bronchi, possibly secretions or aspiration. Apical pleural calcifications are greater on the left, likely postinflammatory. No pleural effusions.  Musculoskeletal: Old rib fractures. Compression of the T8 vertebral body is unchanged since prior study. Review of the MIP images confirms the above findings. CTA ABDOMEN AND PELVIS FINDINGS VASCULAR Aorta: Normal caliber aorta without aneurysm, dissection, vasculitis or significant stenosis. Diffuse aortic calcification. Celiac:  Celiac axis supplies the splenic artery and left gastric artery. Vessels appear patent without evidence of aneurysm, occlusion, or dissection. SMA: The hepatic artery arises from the superior mesenteric artery. Both vessels are patent without evidence of aneurysm, occlusion, or dissection. Renals: Both renal arteries are patent without evidence of aneurysm, dissection, vasculitis, fibromuscular dysplasia or significant stenosis. IMA: Patent without evidence of aneurysm, dissection, vasculitis or significant stenosis. Inflow: Patent without evidence of aneurysm, dissection, vasculitis or significant stenosis. Veins: No obvious venous abnormality within the limitations of this arterial phase study. Review of the MIP images confirms the above findings. NON-VASCULAR Hepatobiliary: No focal liver abnormality is seen. No gallstones, gallbladder wall thickening, or biliary dilatation. Pancreas: Unremarkable. No pancreatic ductal dilatation or surrounding inflammatory changes. Spleen: Normal in size without focal abnormality. Adrenals/Urinary Tract: Adrenal glands are unremarkable. Kidneys are normal, without renal calculi, focal lesion, or hydronephrosis. Bladder is unremarkable. Stomach/Bowel: Stomach, small bowel, and colon are not abnormally distended. Stool fills the colon. No wall thickening or inflammatory changes are appreciated. Appendix is normal. Lymphatic: No significant lymphadenopathy. Reproductive: Prostate gland is not enlarged. Visualization is limited due to streak artifact from hip prostheses. Other: No free air or free fluid in the abdomen. Abdominal wall musculature appears  intact. Musculoskeletal: Bilateral total hip arthroplasties. Degenerative changes in the lumbar spine. Review of the MIP images confirms the above findings. IMPRESSION: 1. Diffuse aortic atherosclerosis. No evidence of aortic dissection. 2. 4.2 cm diameter ascending thoracic aortic aneurysm. Recommend annual imaging followup by CTA or MRA. This recommendation follows 2010 ACCF/AHA/AATS/ACR/ASA/SCA/SCAI/SIR/STS/SVM Guidelines for the Diagnosis and Management of Patients with Thoracic Aortic Disease. Circulation. 2010; 121: U272-Z366. Aortic aneurysm NOS (ICD10-I71.9) 3. Bronchiectasis with mucous plugging versus secretions in the lung bases as well as peribronchial infiltrates. Changes may represent aspiration or pneumonia. 4. Increased distention and thick-walled appearance of the esophagus since prior study. Changes likely related to history of Barrett's esophagus and esophagitis. Consider gastroenterology referral to exclude obstructing lesion. Electronically Signed   By: Lucienne Capers M.D.   On: 09/30/2021 19:40   DG Chest Portable 1 View  Result Date: 09/30/2021 CLINICAL DATA:  Cough, hemoptysis EXAM: PORTABLE CHEST 1 VIEW COMPARISON:  06/28/2021 FINDINGS: Cardiac size is within normal limits. There are no signs of pulmonary edema. Left hemidiaphragm is elevated. There is interval clearing of linear density in the lateral aspect of the right lower lung field. New linear densities are seen in medial right lower lung field. There is no significant pleural effusion or pneumothorax. There are multiple old healed fractures in the left ribs. IMPRESSION: Small new linear densities in the medial right lower lung fields suggest subsegmental atelectasis. There are no signs of pulmonary edema or focal pulmonary consolidation. Electronically Signed   By: Elmer Picker M.D.   On: 09/30/2021 18:31       LOS: 2 days   [facility] Pager on [email or web address]  10/02/2021, 8:57 AM

## 2021-10-02 NOTE — Progress Notes (Signed)
Report called to [place]. Patient transported to the floor in stable condition.

## 2021-10-02 NOTE — Op Note (Signed)
[facility] Patient Name: [name] Procedure Date: 10/02/2021 MRN: [id number] Attending MD: Gladstone Pih. Candis Schatz , MD Date of Birth: Mar 05, 1932 CSN: [id number] Age: 88 Admit Type: Inpatient Procedure:                Upper GI endoscopy Indications:              Dysphagia, Hematemesis Providers:                Nicki Reaper E. Candis Schatz, MD, Grace Isaac, RN, Cherylynn Ridges, Technician Referring MD:              Medicines:                Monitored Anesthesia Care Complications:            No immediate complications. Estimated Blood Loss:     Estimated blood loss was minimal. Procedure:                Pre-Anesthesia Assessment:                           - Prior to the procedure, a History and Physical                            was performed, and patient medications and                            allergies were reviewed. The patient's tolerance of                            previous anesthesia was also reviewed. The risks                            and benefits of the procedure and the sedation                            options and risks were discussed with the patient.                            All questions were answered, and informed consent                            was obtained. Prior Anticoagulants: The patient has                            taken no previous anticoagulant or antiplatelet                            agents except for aspirin. ASA Grade Assessment:                            III - A patient with severe systemic disease. After  reviewing the risks and benefits, the patient was                            deemed in satisfactory condition to undergo the                            procedure.                           After obtaining informed consent, the endoscope was                            passed under direct vision. Throughout the                            procedure, the patient's blood pressure,  pulse, and                            oxygen saturations were monitored continuously. The                            [id number]-[id number] ([id number]) Olympus endoscope was introduced                            through the mouth, and advanced to the lower third                            of esophagus. The upper GI endoscopy was                            accomplished without difficulty. The patient                            tolerated the procedure well. The [id number]                            ([id number]) Olympus therapeutic endoscope was                            introduced through the mouth, and advanced to the                            third part of duodenum. Scope In: Scope Out: Findings:      The examined portions of the nasopharynx, oropharynx and larynx were       normal.      A large amount of clotted and red blood was found in the lower third of       the esophagus. Attempts to suction clot with standard endoscope were       unsuccessful and so this endoscope was withdrawn and a therapeutic       endoscope was inserted. The clot was removed with suction.      Severe esophagitis with bleeding was seen once the clot was removed.       There was active oozing at the GEJ. Coagulation for hemostasis using  argon plasma was successful.      One linear esophageal ulcer/mucosal tear with stigmata of recent       bleeding was found just above the GEJ. The lesion was about 10 mm in       largest dimension.      One benign-appearing, intrinsic moderate stenosis was found at the       gastroesophageal junction. This stenosis measured 1 cm (inner diameter)       x less than one cm (in length). The stenosis was traversed.      A large hiatal hernia was present.      The exam of the stomach was otherwise normal.      The in the duodenum was normal. Impression:               - The examined portions of the nasopharynx,                            oropharynx and larynx were normal.                            - Clotted blood in the lower third of the esophagus.                           - Severe esophagitis with bleeding. Treated with                            argon plasma coagulation (APC). This esophagitis                            may be reflux or stasis in etiology                           - Esophageal ulcer with stigmata of recent bleeding.                           - Large hiatal hernia.                           - Normal.                           - No specimens collected. Moderate Sedation:      Not Applicable - Patient had care per Anesthesia. Recommendation:           - Return patient to hospital ward for ongoing care.                           - NPO other than carafate and ice chips.                           - Continue present medications.                           - Use sucralfate suspension 1 gram PO QID.                           -  Continue IV PPI                           - Consider repeat EGD in 8 weeks to reassess                            esophagitis and consider repeat dilation Procedure Code(s):        --- Professional ---                           [phone], Esophagogastroduodenoscopy, flexible,                            transoral; with control of bleeding, any method Diagnosis Code(s):        --- Professional ---                           K22.8, Other specified diseases of esophagus                           K20.91, Esophagitis, unspecified with bleeding                           K22.11, Ulcer of esophagus with bleeding                           K44.9, Diaphragmatic hernia without obstruction or                            gangrene                           R13.10, Dysphagia, unspecified                           K92.0, Hematemesis CPT copyright 2019 [employer]. All rights reserved. The codes documented in this report are preliminary and upon coder review may  be revised to meet current compliance requirements. Conroy Goracke E. Candis Schatz, MD 10/02/2021  9:23:51 AM This report has been signed electronically. Number of Addenda: 0

## 2021-10-02 NOTE — Transfer of Care (Signed)
Immediate Anesthesia Transfer of Care Note  Patient: [name]  Procedure(s) Performed: ESOPHAGOGASTRODUODENOSCOPY (EGD) WITH PROPOFOL HOT HEMOSTASIS (ARGON PLASMA COAGULATION/BICAP)  Patient Location: PACU  Anesthesia Type:MAC  Level of Consciousness: awake, drowsy and patient cooperative  Airway & Oxygen Therapy: Patient Spontanous Breathing and Patient connected to face mask oxygen  Post-op Assessment: Report given to RN and Post -op Vital signs reviewed and stable  Post vital signs: Reviewed and stable  Last Vitals:  Vitals Value Taken Time  BP 101/50 10/02/21 0920  Temp    Pulse 73 10/02/21 0923  Resp 25 10/02/21 0923  SpO2 97 % 10/02/21 0923  Vitals shown include unvalidated device data.  Last Pain:  Vitals:   10/02/21 0745  TempSrc: Oral  PainSc: 0-No pain      Patients Stated Pain Goal: 0 (73/71/06 2694)  Complications: No notable events documented.

## 2021-10-02 NOTE — Anesthesia Postprocedure Evaluation (Signed)
Anesthesia Post Note  Patient: [name]  Procedure(s) Performed: ESOPHAGOGASTRODUODENOSCOPY (EGD) WITH PROPOFOL HOT HEMOSTASIS (ARGON PLASMA COAGULATION/BICAP)     Patient location during evaluation: Endoscopy Anesthesia Type: MAC Level of consciousness: awake and alert Pain management: pain level controlled Vital Signs Assessment: post-procedure vital signs reviewed and stable Respiratory status: spontaneous breathing, nonlabored ventilation, respiratory function stable and patient connected to nasal cannula oxygen Cardiovascular status: blood pressure returned to baseline and stable Postop Assessment: no apparent nausea or vomiting Anesthetic complications: no   No notable events documented.  Last Vitals:  Vitals:   10/02/21 0928 10/02/21 0930  BP: (!) 106/52 (!) 102/54  Pulse: 76 71  Resp: (!) 24 (!) 23  Temp:    SpO2: 92% 92%    Last Pain:  Vitals:   10/02/21 0930  TempSrc:   PainSc: 0-No pain                 Barnet Glasgow

## 2021-10-03 DIAGNOSIS — K922 Gastrointestinal hemorrhage, unspecified: Secondary | ICD-10-CM | POA: Diagnosis not present

## 2021-10-03 DIAGNOSIS — E039 Hypothyroidism, unspecified: Secondary | ICD-10-CM | POA: Diagnosis not present

## 2021-10-03 DIAGNOSIS — K222 Esophageal obstruction: Secondary | ICD-10-CM

## 2021-10-03 DIAGNOSIS — J690 Pneumonitis due to inhalation of food and vomit: Secondary | ICD-10-CM | POA: Diagnosis not present

## 2021-10-03 LAB — CULTURE, RESPIRATORY W GRAM STAIN: Culture: NORMAL

## 2021-10-03 LAB — CBC
HCT: 29.4 % — ABNORMAL LOW (ref 39.0–52.0)
Hemoglobin: 9.2 g/dL — ABNORMAL LOW (ref 13.0–17.0)
MCH: 30 pg (ref 26.0–34.0)
MCHC: 31.3 g/dL (ref 30.0–36.0)
MCV: 95.8 fL (ref 80.0–100.0)
Platelets: 199 10*3/uL (ref 150–400)
RBC: 3.07 MIL/uL — ABNORMAL LOW (ref 4.22–5.81)
RDW: 16.1 % — ABNORMAL HIGH (ref 11.5–15.5)
WBC: 12.8 10*3/uL — ABNORMAL HIGH (ref 4.0–10.5)
nRBC: 0 % (ref 0.0–0.2)

## 2021-10-03 LAB — GLUCOSE, CAPILLARY
Glucose-Capillary: 89 mg/dL (ref 70–99)
Glucose-Capillary: 158 mg/dL — ABNORMAL HIGH (ref 70–99)
Glucose-Capillary: 91 mg/dL (ref 70–99)

## 2021-10-03 LAB — BASIC METABOLIC PANEL
Anion gap: 8 (ref 5–15)
BUN: 16 mg/dL (ref 8–23)
CO2: 19 mmol/L — ABNORMAL LOW (ref 22–32)
Calcium: 7.6 mg/dL — ABNORMAL LOW (ref 8.9–10.3)
Chloride: 113 mmol/L — ABNORMAL HIGH (ref 98–111)
Creatinine, Ser: 0.9 mg/dL (ref 0.61–1.24)
GFR, Estimated: >60 mL/min (ref >60)
Glucose, Bld: 65 mg/dL — ABNORMAL LOW (ref 70–99)
Potassium: 3.9 mmol/L (ref 3.5–5.1)
Sodium: 140 mmol/L (ref 135–145)

## 2021-10-03 MED ORDER — BOOST / RESOURCE BREEZE PO LIQD CUSTOM
1.0000 | Freq: Three times a day (TID) | ORAL | Status: DC
  Administered 2021-10-03 – 2021-10-05 (×6): 1 via ORAL

## 2021-10-03 NOTE — Progress Notes (Signed)
Pt stable at this time. Pt tolerating clear liquid diet well. Family in room.

## 2021-10-03 NOTE — Progress Notes (Signed)
[facility] PROGRESS NOTE   [name] YQM:[id number] DOB: Jun 30, 1932 DOA: 09/30/2021  PCP: Dettinger, [name], MD  Brief History/Interval Summary: 88 y.o. male with medical history significant of GERD Berretts esophagus, pituitary microadenoma who presented with hemoptysis versus hematemesis.  It looks like initially patient did have episodes of hematemesis which was followed by episodes of hemoptysis.  Patient was hospitalized for further management.  Consultants: Gastroenterology  Procedures:  EGD 8/5 Impression:               - The examined portions of the nasopharynx,                            oropharynx and larynx were normal.                           - Clotted blood in the lower third of the esophagus.                           - Severe esophagitis with bleeding. Treated with                            argon plasma coagulation (APC). This esophagitis                            may be reflux or stasis in etiology                           - Esophageal ulcer with stigmata of recent bleeding.                           - Large hiatal hernia.                           - Normal.                           - No specimens collected.    Subjective/Interval History: Patient mentioned that he feels well.  Still has a cough but not coughing up any blood anymore.  Abdominal pain is better.  Was able to swallow sips of water.  Son is at the bedside.    Assessment/Plan:  Upper GI bleed/dysphagia There was some confusion initially whether patient had hematemesis or hemoptysis.  Looks like patient initially with vomiting of blood and then this was followed by hemoptysis.  I think he originally had upper GI bleed.  Patient not on any anticoagulants.   Patient was seen by gastroenterology.  Underwent EGD on 8/5 which showed evidence for severe esophagitis with bleeding.  Treated with APC.  Also found to have clotted blood in the lower third of the esophagus which was the likely  reason for his dysphagia. Diet to be advanced today to clear liquids.  Hemoglobin noted to be stable.  On PPI twice a day now.  Also on Carafate. Last upper endoscopy was in April 2022 which showed esophagitis and hiatal hernia. CT angiogram showed distention and thick-walled appearance of esophagus.  Patient mentions that he has had esophageal dilatation previously.  Acute blood loss anemia Presented with hemoglobin of 12.  Hemoglobin dropped to 8.7  but has been stable in the last 24 hours.  Bleeding appears to have subsided.  Transfuse if it drops below 7.    Aspiration pneumonia/hemoptysis Hemoptysis likely from blood that he may have aspirated after his episodes of hematemesis.   CT angiogram did show just changes of bronchiectasis with mucous plugging.  Concern for aspiration pneumonia.  Patient started on Unasyn.  Respiratory status stable.  Procalcitonin noted to be less than 0.1. Leave him on IV antibiotics for now.  History of atrial fibrillation, paroxysmal Aspirin on hold.  Not on anticoagulation.  Replace potassium.  Magnesium is 2.0.  History of COPD Stable.  BPH Continue Flomax.  Will discontinue Foley catheter.  Pituitary adenoma Followed by endocrinology as outpatient.  Noted to be on cabergoline.  Thoracic aortic aneurysm No evidence for dissection on CT angiogram.  Outpatient monitoring.  Hypothyroidism Continue levothyroxine.  DVT Prophylaxis: SCDs Code Status: Full code Family Communication: Discussed with patient and his family Disposition Plan: Hopefully return home when improved.  Continue to mobilize..  Seen by PT.  Home health is recommended.  Status is: Inpatient Remains inpatient appropriate because: Upper GI bleed    Medications: Scheduled:  [START ON 10/04/2021] cabergoline  0.25 mg Oral Once per day on Mon Thu   Chlorhexidine Gluconate Cloth  6 each Topical Daily   fluticasone furoate-vilanterol  1 puff Inhalation Daily   And   umeclidinium  bromide  1 puff Inhalation Daily   levothyroxine  75 mcg Oral Daily   [START ON 10/04/2021] pantoprazole  40 mg Intravenous Q12H   sucralfate  1 g Oral TID WC & HS   tamsulosin  0.4 mg Oral QPC supper   Continuous:  ampicillin-sulbactam (UNASYN) IV 3 g (10/03/21 0825)   lactated ringers 10 mL/hr at 10/02/21 1500   pantoprazole 8 mg/hr (10/03/21 0731)   [id number] **OR** acetaminophen, albuterol, mouth rinse  Antibiotics: Anti-infectives (From admission, onward)    Start     Dose/Rate Route Frequency Ordered Stop   09/30/21 2200  Ampicillin-Sulbactam (UNASYN) 3 g in sodium chloride 0.9 % 100 mL IVPB        3 g 200 mL/hr over 30 Minutes Intravenous Every 6 hours 09/30/21 2146     09/30/21 1830  erythromycin 250 mg in sodium chloride 0.9 % 100 mL IVPB        250 mg 100 mL/hr over 60 Minutes Intravenous  Once 09/30/21 1801 09/30/21 2133   09/30/21 1800  erythromycin 205 mg in sodium chloride 0.9 % 100 mL IVPB  Status:  Discontinued        3 mg/kg  68.9 kg 100 mL/hr over 60 Minutes Intravenous  Once 09/30/21 1749 09/30/21 1801       Objective:  Vital Signs  Vitals:   10/02/21 2210 10/03/21 0152 10/03/21 0525 10/03/21 0908  BP: 126/64 (!) 111/54 (!) 113/55   Pulse: 73 63 (!) 59   Resp: '18 18 18   '$ Temp: 98.1 F (36.7 C) 98.3 F (36.8 C) 98.4 F (36.9 C)   TempSrc:      SpO2: 97% 96% 95% 98%  Weight:      Height:        Intake/Output Summary (Last 24 hours) at 10/03/2021 0915 Last data filed at 10/03/2021 0729 Gross per 24 hour  Intake 947.72 ml  Output 1125 ml  Net -177.28 ml    Filed Weights   09/30/21 1641 10/01/21 0621  Weight: 68.9 kg 70.6 kg    General appearance: Awake alert.  In no distress Resp: Normal effort.  Diminished air entry at the bases.  Few crackles.  No wheezing or rhonchi. Cardio: S1-S2 is normal regular.  No S3-S4.  No rubs murmurs or bruit GI: Abdomen is soft.  Nontender nondistended.  Bowel sounds are present normal.  No masses  organomegaly Extremities: No edema.  Full range of motion of lower extremities. Neurologic:   No focal neurological deficits.     Lab Results:  Data Reviewed: I have personally reviewed following labs and reports of the imaging studies  CBC: Recent Labs  Lab 09/30/21 1740 09/30/21 2103 10/01/21 0435 10/01/21 1013 10/01/21 1847 10/02/21 0247 10/02/21 1356 10/03/21 0738  WBC 11.9* 17.5* 15.0* 14.2*  --  11.9*  --  12.8*  NEUTROABS 8.8*  --   --   --   --   --   --   --   HGB 12.0* 11.6* 9.6* 9.4* 9.2* 8.7* 8.9* 9.2*  HCT 37.6* 36.4* 29.9* 29.0* 29.1* 28.1* 28.0* 29.4*  MCV 93.3 94.1 94.9 94.5  --  97.2  --  95.8  PLT 248 236 195 184  --  177  --  199     Basic Metabolic Panel: Recent Labs  Lab 09/30/21 1740 09/30/21 2103 10/01/21 0435 10/02/21 0247 10/03/21 0811  NA 137  --  141 143 140  K 3.8  --  4.0 3.6 3.9  CL 108  --  113* 116* 113*  CO2 23  --  23 23 19*  GLUCOSE 133*  --  116* 86 65*  BUN 40*  --  28* 19 16  CREATININE 0.95  --  0.93 0.95 0.90  CALCIUM 8.5*  --  7.7* 7.7* 7.6*  MG  --  2.0  --  2.0  --   PHOS  --  2.4*  --   --   --      GFR: Estimated Creatinine Clearance: 54.1 mL/min (by C-G formula based on SCr of 0.9 mg/dL).  Liver Function Tests: Recent Labs  Lab 09/30/21 1740 10/01/21 0435  AST 23 18  ALT 21 16  ALKPHOS 74 55  BILITOT 0.5 0.6  PROT 7.0 5.4*  ALBUMIN 3.8 2.9*     Recent Labs  Lab 09/30/21 1740  LIPASE 35     Coagulation Profile: Recent Labs  Lab 09/30/21 1740  INR 1.0     Cardiac Enzymes: Recent Labs  Lab 09/30/21 2103  CKTOTAL 85      CBG: Recent Labs  Lab 09/30/21 1701  GLUCAP 136*      Thyroid Function Tests: Recent Labs    09/30/21 2103  TSH 2.622      Recent Results (from the past 240 hour(s))  Expectorated Sputum Assessment w Gram Stain, Rflx to Resp Cult     Status: None   Collection Time: 10/01/21  2:41 AM   Specimen: Expectorated Sputum  Result Value Ref Range Status    Specimen Description EXPECTORATED SPUTUM  Final   Special Requests NONE  Final   Sputum evaluation   Final    THIS SPECIMEN IS ACCEPTABLE FOR SPUTUM CULTURE Performed at [facility], [place] [address] [place], [place] [address]    Report Status 10/01/2021 FINAL  Final  Culture, Respiratory w Gram Stain     Status: None (Preliminary result)   Collection Time: 10/01/21  2:41 AM  Result Value Ref Range Status   Specimen Description   Final    EXPECTORATED SPUTUM Performed at [facility]  Hospital, [place] [address], [place], [place] [address]    Special Requests   Final    NONE Reflexed from [id number] Performed at [facility], [place] [address] [place], [place] [address]    Gram Stain   Final    FEW SQUAMOUS EPITHELIAL CELLS PRESENT FEW WBC PRESENT, PREDOMINANTLY MONONUCLEAR FEW GRAM NEGATIVE RODS FEW GRAM POSITIVE COCCI IN PAIRS    Culture   Final    CULTURE REINCUBATED FOR BETTER GROWTH Performed at [facility], [place] [address] [place], [place] [address]    Report Status PENDING  Incomplete  MRSA Next Gen by PCR, Nasal     Status: None   Collection Time: 10/01/21  4:30 AM   Specimen: Nasal Mucosa; Nasal Swab  Result Value Ref Range Status   MRSA by PCR Next Gen NOT DETECTED NOT DETECTED Final    Comment: (NOTE) The GeneXpert MRSA Assay (FDA approved for NASAL specimens only), is one component of a comprehensive MRSA colonization surveillance program. It is not intended to diagnose MRSA infection nor to guide or monitor treatment for MRSA infections. Test performance is not FDA approved in patients less than 26 years old. Performed at [facility], [place] [address] [place], [place] [address]       Radiology Studies: No results found.     LOS: 3 days   Brennyn Ortlieb [employer] on [email or web address]  10/03/2021, 9:15 AM

## 2021-10-03 NOTE — Progress Notes (Signed)
[facility] ROUNDING NOTE   Subjective: Patient did well overnight.  No difficulty swallowing.  No pain with swallowing.  He has brought up clear sputum/saliva.  Patient would really like to advance diet.  Hemoglobin stable this morning at 9.4.   Objective: Vital signs in last 24 hours: Temp:  [97.8 F (36.6 C)-98.5 F (36.9 C)] 98.4 F (36.9 C) (08/06 0525) Pulse Rate:  [59-76] 59 (08/06 0525) Resp:  [17-31] 18 (08/06 0525) BP: (101-127)/(49-64) 113/55 (08/06 0525) SpO2:  [92 %-98 %] 95 % (08/06 0525) Last BM Date :  (PTA) General: NAD, pleasant frail elderly Caucasian male Lungs: Coarse breath sounds bilaterally Heart:  RRR, no m/r/g Abdomen:  Soft, NT, ND, +BS    Intake/Output from previous day: 08/05 0701 - 08/06 0700 In: 1347.7 [I.V.:609.3; IV Piggyback:738.4] Out: 1125 [Urine:1125] Intake/Output this shift: No intake/output data recorded.   Lab Results: Recent Labs    10/01/21 1013 10/01/21 1847 10/02/21 0247 10/02/21 1356 10/03/21 0738  WBC 14.2*  --  11.9*  --  12.8*  HGB 9.4*   < > 8.7* 8.9* 9.2*  PLT 184  --  177  --  199  MCV 94.5  --  97.2  --  95.8   < > = values in this interval not displayed.   BMET Recent Labs    09/30/21 1740 10/01/21 0435 10/02/21 0247  NA 137 141 143  K 3.8 4.0 3.6  CL 108 113* 116*  CO2 '23 23 23  '$ GLUCOSE 133* 116* 86  BUN 40* 28* 19  CREATININE 0.95 0.93 0.95  CALCIUM 8.5* 7.7* 7.7*   LFT Recent Labs    09/30/21 1740 10/01/21 0435  PROT 7.0 5.4*  ALBUMIN 3.8 2.9*  AST 23 18  ALT 21 16  ALKPHOS 74 55  BILITOT 0.5 0.6   PT/INR Recent Labs    09/30/21 1740  INR 1.0      Imaging/Other results: ECHOCARDIOGRAM COMPLETE  Result Date: 10/01/2021    ECHOCARDIOGRAM REPORT   Patient Name:   [name] Date of Exam: 10/01/2021 Medical Rec #:  [id number]       Height:       67.0 in Accession #:    [id number]      Weight:       155.6 lb Date of Birth:  12/01/1932       BSA:          1.818 m  Patient Age:    88 years        BP:           98/51 mmHg Patient Gender: M               HR:           66 bpm. Exam Location:  Inpatient Procedure: 2D Echo, Color Doppler and Cardiac Doppler Indications:    R07.9* Chest pain, unspecified  History:        Patient has prior history of Echocardiogram examinations, most                 recent 06/14/2015. CAD, COPD, Arrythmias:Atrial Fibrillation;                 Risk Factors:Dyslipidemia.  Sonographer:    Raquel Sarna Senior RDCS Referring Phys: [place]  1. Left ventricular ejection fraction, by estimation, is 60 to 65%. The left ventricle has normal function. The left ventricle has no regional wall motion abnormalities. Left ventricular diastolic parameters are  consistent with Grade II diastolic dysfunction (pseudonormalization).  2. Right ventricular systolic function is normal. The right ventricular size is normal. There is normal pulmonary artery systolic pressure. The estimated right ventricular systolic pressure is 67.8 mmHg.  3. Left atrial size was mildly dilated.  4. Right atrial size was mildly dilated.  5. The mitral valve is normal in structure. Mild mitral valve regurgitation. No evidence of mitral stenosis.  6. The aortic valve is tricuspid. Aortic valve regurgitation is trivial. No aortic stenosis is present.  7. Aortic dilatation noted. There is mild dilatation of the aortic root, measuring 38 mm.  8. The inferior vena cava is normal in size with greater than 50% respiratory variability, suggesting right atrial pressure of 3 mmHg. FINDINGS  Left Ventricle: Left ventricular ejection fraction, by estimation, is 60 to 65%. The left ventricle has normal function. The left ventricle has no regional wall motion abnormalities. The left ventricular internal cavity size was normal in size. There is  no left ventricular hypertrophy. Left ventricular diastolic parameters are consistent with Grade II diastolic dysfunction (pseudonormalization).  Right Ventricle: The right ventricular size is normal. No increase in right ventricular wall thickness. Right ventricular systolic function is normal. There is normal pulmonary artery systolic pressure. The tricuspid regurgitant velocity is 2.61 m/s, and  with an assumed right atrial pressure of 3 mmHg, the estimated right ventricular systolic pressure is 93.8 mmHg. Left Atrium: Left atrial size was mildly dilated. Right Atrium: Right atrial size was mildly dilated. Pericardium: There is no evidence of pericardial effusion. Mitral Valve: The mitral valve is normal in structure. Mild mitral valve regurgitation. No evidence of mitral valve stenosis. Tricuspid Valve: The tricuspid valve is normal in structure. Tricuspid valve regurgitation is trivial. Aortic Valve: The aortic valve is tricuspid. Aortic valve regurgitation is trivial. No aortic stenosis is present. Pulmonic Valve: The pulmonic valve was normal in structure. Pulmonic valve regurgitation is not visualized. Aorta: Aortic dilatation noted. There is mild dilatation of the aortic root, measuring 38 mm. Venous: The inferior vena cava is normal in size with greater than 50% respiratory variability, suggesting right atrial pressure of 3 mmHg. IAS/Shunts: No atrial level shunt detected by color flow Doppler.  LEFT VENTRICLE PLAX 2D LVIDd:         4.20 cm   Diastology LVIDs:         2.00 cm   LV e' medial:    7.18 cm/s LV PW:         0.80 cm   LV E/e' medial:  9.7 LV IVS:        1.10 cm   LV e' lateral:   9.14 cm/s LVOT diam:     2.10 cm   LV E/e' lateral: 7.6 LV SV:         79 LV SV Index:   44 LVOT Area:     3.46 cm  RIGHT VENTRICLE RV S prime:     14.10 cm/s TAPSE (M-mode): 2.3 cm LEFT ATRIUM             Index        RIGHT ATRIUM           Index LA diam:        3.30 cm 1.82 cm/m   RA Area:     19.30 cm LA Vol (A2C):   93.0 ml 51.16 ml/m  RA Volume:   46.90 ml  25.80 ml/m LA Vol (A4C):   41.5 ml 22.83 ml/m LA Biplane Vol:  61.9 ml 34.05 ml/m  AORTIC VALVE  LVOT Vmax:   94.60 cm/s LVOT Vmean:  65.000 cm/s LVOT VTI:    0.229 m  AORTA Ao Root diam: 3.80 cm Ao Asc diam:  3.70 cm MITRAL VALVE               TRICUSPID VALVE MV Area (PHT): 3.17 cm    TR Peak grad:   27.2 mmHg MV Decel Time: 239 msec    TR Vmax:        261.00 cm/s MV E velocity: 69.40 cm/s MV A velocity: 63.00 cm/s  SHUNTS MV E/A ratio:  1.10        Systemic VTI:  0.23 m                            Systemic Diam: 2.10 cm Dalton McleanMD Electronically signed by Franki Monte Signature Date/Time: 10/01/2021/2:07:07 PM    Final       Assessment and Plan:  88 year old male with known peptic stricture and history of severe esophagitis, admitted with chest pain and hematemesis with four-point drop in hemoglobin with subsequent development of complete solid and liquid dysphagia.  EGD August 5 with large clot burden and active bleeding in the esophagus.  The clot was causing occlusion of the esophagus causing the patient's dysphagia.  Severe esophagitis and ulceration with active bleeding was noted in the distal esophagus, treated with APC.  A moderate esophageal stricture traversable without resistance was also noted but not dilated. It is not entirely clear to me whether the severe esophagitis is related to acid reflux versus stasis/pill esophagitis.  Therefore, it is very important that the patient to be very vigilant about taking his acid suppressing medication, as well as being very vigilant with how he eats and takes his medications. He should take all his medications with a full glass of water and remain upright for at least 30 minutes.  He needs to be very careful eating foods such as chewing meats, making sure to chew well and slowly with very small bites. It may be reasonable for him to have a repeat upper endoscopy in a few months as an outpatient to reassess the esophagitis and reconsider dilation.  We will arrange follow-up with Dr. Hilarie Fredrickson to further discuss the risk/benefits of elective  outpatient endoscopy.  Esophagitis with hemorrhage - Okay to advance to clear liquid diet today; if hemoglobin stable tomorrow, would advance to soft diet - Continue Carafate suspension 4 times daily in hospital; patient would greatly benefit from Carafate suspension as an outpatient, although it is very expensive.  If unable to obtain suspension as outpatient, would recommend patient make his own suspension from the Carafate tablets and to take this twice daily for at least 4 weeks - Continue IV PPI today, transition to p.o. PPI tomorrow if hemoglobin stable -Follow-up with Dr. Hilarie Fredrickson as outpatient to consider repeat EGD to assess healing and potentially dilate stricture - Dr. Carlean Purl will be assuming inpatient care tomorrow.   Daryel November, MD  10/03/2021, 8:34 AM [facility]

## 2021-10-03 NOTE — Plan of Care (Signed)
  Problem: Education: Goal: Knowledge of General Education information will improve Description: Including pain rating scale, medication(s)/side effects and non-pharmacologic comfort measures Outcome: Progressing   Problem: Health Behavior/Discharge Planning: Goal: Ability to manage health-related needs will improve Outcome: Progressing   Problem: Activity: Goal: Risk for activity intolerance will decrease Outcome: Progressing   

## 2021-10-03 NOTE — Plan of Care (Signed)
  Problem: Education: Goal: Knowledge of General Education information will improve Description: Including pain rating scale, medication(s)/side effects and non-pharmacologic comfort measures Outcome: Progressing   Problem: Safety: Goal: Ability to remain free from injury will improve Outcome: Progressing   

## 2021-10-03 NOTE — Progress Notes (Signed)
Chart reviewed. Dysphagia being managed by GI. Patient underwent endoscopy previous date and per GI notes, swallowing improved and patient eager to advance diet. Consulted with Dr. Nicki Guadalajara who reported that SLP consult is not indicated at this time. Will d/c order however please re-consult if concerns of oropharyngeal dysphagia are present.   [place] MA, CCC-SLP

## 2021-10-03 NOTE — Progress Notes (Signed)
Pharmacy Antibiotic Note  [name] is a 88 y.o. male admitted on 09/30/2021.  Pharmacy has been consulted for Unasyn dosing for aspiration pneumonia. Dr. Roel Cluck clarified the patient's PCN allergy:  states he got lightheaded after getting a shot, but has had amoxicilin since then.    Day #4 Unasyn - Afebrile - WBC 12.8, better - SCr 0.9, stable - Sputum cx: normal flora  Plan: Unasyn 3g IV q6h. No dose adjustments anticipated.  Pharmacy will sign off and monitor peripherally via electronic surveillance software for any changes in renal function or micro data.    Height: '5\' 7"'$  (170.2 cm) Weight: 70.6 kg (155 lb 10.3 oz) IBW/kg (Calculated) : 66.1  Temp (24hrs), Avg:98.2 F (36.8 C), Min:97.8 F (36.6 C), Max:98.5 F (36.9 C)  Recent Labs  Lab 09/30/21 1740 09/30/21 2103 10/01/21 0435 10/01/21 1013 10/02/21 0247 10/03/21 0738 10/03/21 0811  WBC 11.9* 17.5* 15.0* 14.2* 11.9* 12.8*  --   CREATININE 0.95  --  0.93  --  0.95  --  0.90     Estimated Creatinine Clearance: 54.1 mL/min (by C-G formula based on SCr of 0.9 mg/dL).    Allergies  Allergen Reactions   Gadavist [Gadobutrol] Hives   Prednisone     Agitation and hallucinations   Betadine [Povidone Iodine] Other (See Comments)    Blisters    Lortab [Hydrocodone-Acetaminophen] Nausea And Vomiting   Penicillins Other (See Comments)    Lightheadedness  Has patient had a PCN reaction causing immediate rash, facial/tongue/throat swelling, SOB or lightheadedness with hypotension: Yes Has patient had a PCN reaction causing severe rash involving mucus membranes or skin necrosis: No Has patient had a PCN reaction that required hospitalization No Has patient had a PCN reaction occurring within the last 10 years: No If all of the above answers are "NO", then may proceed with Cephalosporin use.   Zetia [Ezetimibe] Other (See Comments)    Muscle weakness    Zocor [Simvastatin] Nausea Only and Other (See Comments)     Muscle weakness     Peggyann Juba, PharmD, BCPS [facility] main pharmacy [phone] 10/03/2021 11:03 AM

## 2021-10-03 NOTE — Plan of Care (Signed)
  Problem: Clinical Measurements: Goal: Respiratory complications will improve Outcome: Progressing   Problem: Clinical Measurements: Goal: Cardiovascular complication will be avoided Outcome: Progressing   Problem: Coping: Goal: Level of anxiety will decrease Outcome: Progressing   Problem: Elimination: Goal: Will not experience complications related to bowel motility Outcome: Progressing   Problem: Skin Integrity: Goal: Risk for impaired skin integrity will decrease Outcome: Progressing

## 2021-10-04 ENCOUNTER — Encounter (HOSPITAL_COMMUNITY): Payer: Self-pay | Admitting: Gastroenterology

## 2021-10-04 DIAGNOSIS — J690 Pneumonitis due to inhalation of food and vomit: Secondary | ICD-10-CM | POA: Diagnosis not present

## 2021-10-04 DIAGNOSIS — K449 Diaphragmatic hernia without obstruction or gangrene: Secondary | ICD-10-CM

## 2021-10-04 DIAGNOSIS — K922 Gastrointestinal hemorrhage, unspecified: Secondary | ICD-10-CM | POA: Diagnosis not present

## 2021-10-04 DIAGNOSIS — K2211 Ulcer of esophagus with bleeding: Secondary | ICD-10-CM

## 2021-10-04 DIAGNOSIS — E039 Hypothyroidism, unspecified: Secondary | ICD-10-CM | POA: Diagnosis not present

## 2021-10-04 LAB — BASIC METABOLIC PANEL
Anion gap: 3 — ABNORMAL LOW (ref 5–15)
BUN: 13 mg/dL (ref 8–23)
CO2: 21 mmol/L — ABNORMAL LOW (ref 22–32)
Calcium: 7.5 mg/dL — ABNORMAL LOW (ref 8.9–10.3)
Chloride: 113 mmol/L — ABNORMAL HIGH (ref 98–111)
Creatinine, Ser: 0.87 mg/dL (ref 0.61–1.24)
GFR, Estimated: >60 mL/min (ref >60)
Glucose, Bld: 93 mg/dL (ref 70–99)
Potassium: 3.5 mmol/L (ref 3.5–5.1)
Sodium: 137 mmol/L (ref 135–145)

## 2021-10-04 LAB — GLUCOSE, CAPILLARY
Glucose-Capillary: 82 mg/dL (ref 70–99)
Glucose-Capillary: 127 mg/dL — ABNORMAL HIGH (ref 70–99)

## 2021-10-04 LAB — CBC
HCT: 30.4 % — ABNORMAL LOW (ref 39.0–52.0)
Hemoglobin: 9.6 g/dL — ABNORMAL LOW (ref 13.0–17.0)
MCH: 29.4 pg (ref 26.0–34.0)
MCHC: 31.6 g/dL (ref 30.0–36.0)
MCV: 93 fL (ref 80.0–100.0)
Platelets: 195 10*3/uL (ref 150–400)
RBC: 3.27 MIL/uL — ABNORMAL LOW (ref 4.22–5.81)
RDW: 15.9 % — ABNORMAL HIGH (ref 11.5–15.5)
WBC: 10.3 10*3/uL (ref 4.0–10.5)
nRBC: 0 % (ref 0.0–0.2)

## 2021-10-04 LAB — LEGIONELLA PNEUMOPHILA SEROGP 1 UR AG: L. pneumophila Serogp 1 Ur Ag: NEGATIVE

## 2021-10-04 MED ORDER — POTASSIUM CHLORIDE CRYS ER 20 MEQ PO TBCR
40.0000 meq | EXTENDED_RELEASE_TABLET | Freq: Once | ORAL | Status: AC
  Administered 2021-10-04: 40 meq via ORAL
  Filled 2021-10-04: qty 2

## 2021-10-04 MED ORDER — PANTOPRAZOLE SODIUM 40 MG PO TBEC
40.0000 mg | DELAYED_RELEASE_TABLET | Freq: Two times a day (BID) | ORAL | Status: DC
  Administered 2021-10-04 – 2021-10-05 (×3): 40 mg via ORAL
  Filled 2021-10-04 (×3): qty 1

## 2021-10-04 MED ORDER — AMOXICILLIN-POT CLAVULANATE 875-125 MG PO TABS
1.0000 | ORAL_TABLET | Freq: Two times a day (BID) | ORAL | Status: DC
Start: 2021-10-04 — End: 2021-10-05
  Administered 2021-10-04 – 2021-10-05 (×3): 1 via ORAL
  Filled 2021-10-04 (×3): qty 1

## 2021-10-04 NOTE — Plan of Care (Signed)
Problem: Education: Goal: Knowledge of General Education information will improve Description: Including pain rating scale, medication(s)/side effects and non-pharmacologic comfort measures Outcome: Progressing   Problem: Clinical Measurements: Goal: Ability to maintain clinical measurements within normal limits will improve Outcome: Progressing   Problem: Clinical Measurements: Goal: Respiratory complications will improve Outcome: [place] V Jmarion Christiano, RN 10/04/21 9:39 AM

## 2021-10-04 NOTE — Progress Notes (Signed)
Physical Therapy Treatment Patient Details Name: [name] MRN: [id number] DOB: April 09, 1932 Today's Date: 10/04/2021   History of Present Illness 88 y.o. male with medical history significant of GERD Berretts esophagus, pituitary microadenoma who presented with hemoptysis versus hematemesis. PMH: CAD, neuropathy, COPD. Bil THA,.    PT Comments    Pt ambulated in hallway and then requested to be left in bathroom.  Family present.  Pt asking to be able to get up and move about room freely (no alarms).  RN notified.  Pt aware he should have staff or family assisting him with mobility at this time for safety.    Recommendations for follow up therapy are one component of a multi-disciplinary discharge planning process, led by the attending physician.  Recommendations may be updated based on patient status, additional functional criteria and insurance authorization.  Follow Up Recommendations  Home health PT     Assistance Recommended at Discharge Intermittent Supervision/Assistance  Patient can return home with the following A little help with bathing/dressing/bathroom;Help with stairs or ramp for entrance;Assistance with cooking/housework;Assist for transportation   Equipment Recommendations  None recommended by PT    Recommendations for Other Services       Precautions / Restrictions Precautions Precautions: Fall Precaution Comments: reports several falls     Mobility  Bed Mobility               General bed mobility comments: pt in recliner    Transfers Overall transfer level: Needs assistance Equipment used: Rolling walker (2 wheels) Transfers: Sit to/from Stand Sit to Stand: Min guard           General transfer comment: cues for hand placement    Ambulation/Gait Ambulation/Gait assistance: Min guard Gait Distance (Feet): 160 Feet Assistive device: Rolling walker (2 wheels) Gait Pattern/deviations: Step-through pattern, Decreased stride length        General Gait Details: slow but steady pace with RW, pt denies any dizziness   Stairs             Wheelchair Mobility    Modified Rankin (Stroke Patients Only)       Balance                                            Cognition Arousal/Alertness: Awake/alert Behavior During Therapy: WFL for tasks assessed/performed Overall Cognitive Status: Within Functional Limits for tasks assessed                                          Exercises Other Exercises Other Exercises: pt educated to perform marching, LAQ and ankle pumps sitting in recliner x10 each    General Comments        Pertinent Vitals/Pain Pain Assessment Pain Assessment: No/denies pain    Home Living                          Prior Function            PT Goals (current goals can now be found in the care plan section) Progress towards PT goals: Progressing toward goals    Frequency    Min 3X/week      PT Plan Current plan remains appropriate    Co-evaluation  AM-PAC PT "6 Clicks" Mobility   Outcome Measure  Help needed turning from your back to your side while in a flat bed without using bedrails?: None Help needed moving from lying on your back to sitting on the side of a flat bed without using bedrails?: A Little Help needed moving to and from a bed to a chair (including a wheelchair)?: A Little Help needed standing up from a chair using your arms (e.g., wheelchair or bedside chair)?: A Little Help needed to walk in hospital room?: A Little Help needed climbing 3-5 steps with a railing? : A Little 6 Click Score: 19    End of Session Equipment Utilized During Treatment: Gait belt Activity Tolerance: Patient tolerated treatment well Patient left: with family/visitor present;with call bell/phone within reach (in bathroom) Nurse Communication: Mobility status PT Visit Diagnosis: Repeated falls (R29.6);Difficulty in walking,  not elsewhere classified (R26.2)     Time: 5427-0623 PT Time Calculation (min) (ACUTE ONLY): 14 min  Charges:  $Gait Training: 8-22 mins                    Jannette Spanner PT, DPT Physical Therapist Acute Rehabilitation Services Preferred contact method: Secure Chat Weekend Pager Only: [phone] Office: [place] 10/04/2021, 3:18 PM

## 2021-10-04 NOTE — Progress Notes (Signed)
[facility] PROGRESS NOTE   [name] UXL:[id number] DOB: January 20, 1933 DOA: 09/30/2021  PCP: Dettinger, [name], MD  Brief History/Interval Summary: 88 y.o. male with medical history significant of GERD Berretts esophagus, pituitary microadenoma who presented with hemoptysis versus hematemesis.  It looks like initially patient did have episodes of hematemesis which was followed by episodes of hemoptysis.  Patient was hospitalized for further management.  Consultants: Gastroenterology  Procedures:  EGD 8/5 Impression:               - The examined portions of the nasopharynx,                            oropharynx and larynx were normal.                           - Clotted blood in the lower third of the esophagus.                           - Severe esophagitis with bleeding. Treated with                            argon plasma coagulation (APC). This esophagitis                            may be reflux or stasis in etiology                           - Esophageal ulcer with stigmata of recent bleeding.                           - Large hiatal hernia.                           - Normal.                           - No specimens collected.    Subjective/Interval History: Patient mentions that he feels well.  No nausea vomiting.  No shortness of breath.  Cough is getting better.  Was able to tolerate his clear liquids yesterday.  Assessment/Plan:  Upper GI bleed/dysphagia There was some confusion initially whether patient had hematemesis or hemoptysis.  Looks like patient initially with vomiting of blood and then this was followed by hemoptysis.  Patient not on any anticoagulants.   Patient was seen by gastroenterology.  Underwent EGD on 8/5 which showed evidence for severe esophagitis with bleeding.  Treated with APC.  Also found to have clotted blood in the lower third of the esophagus which was the likely reason for his dysphagia. Tolerated clear liquids yesterday.  Wait on  GI input today to see if diet can be advanced. Still noted to be on PPI infusion.  This can be changed to oral twice a day.  Also noted to be on Carafate. Last upper endoscopy was in April 2022 which showed esophagitis and hiatal hernia. CT angiogram showed distention and thick-walled appearance of esophagus.  Patient mentions that he has had esophageal dilatation previously.  Acute blood loss anemia Presented with hemoglobin of 12.  Hemoglobin dropped to 8.7 but has been  stable in the last 48 hours.  Bleeding appears to have subsided.  Transfuse if it drops below 7.    Aspiration pneumonia/hemoptysis Hemoptysis likely from blood that he may have aspirated after his episodes of hematemesis.   CT angiogram did show just changes of bronchiectasis with mucous plugging.  Concern for aspiration pneumonia.  Patient started on Unasyn.  Respiratory status stable.  Procalcitonin noted to be less than 0.1. Changed to Augmentin today.  History of atrial fibrillation, paroxysmal Aspirin on hold.  Not on anticoagulation.  Replace potassium.  Magnesium was 2.0 a few days ago.  History of COPD Stable.  BPH Continue Flomax.  Foley catheter was discontinued.  He has been able to void.  Pituitary adenoma Followed by endocrinology as outpatient.  Noted to be on cabergoline.  Thoracic aortic aneurysm No evidence for dissection on CT angiogram.  Outpatient monitoring.  Hypothyroidism Continue levothyroxine.  DVT Prophylaxis: SCDs Code Status: Full code Family Communication: Discussed with patient and his family Disposition Plan: Hopefully return home when improved.  Continue to mobilize..  Seen by PT.  Home health is recommended.  Status is: Inpatient Remains inpatient appropriate because: Upper GI bleed    Medications: Scheduled:  cabergoline  0.25 mg Oral Once per day on Mon Thu   Chlorhexidine Gluconate Cloth  6 each Topical Daily   feeding supplement  1 Container Oral TID WC   fluticasone  furoate-vilanterol  1 puff Inhalation Daily   And   umeclidinium bromide  1 puff Inhalation Daily   levothyroxine  75 mcg Oral Daily   pantoprazole  40 mg Intravenous Q12H   potassium chloride  40 mEq Oral Once   sucralfate  1 g Oral TID WC & HS   tamsulosin  0.4 mg Oral QPC supper   Continuous:  ampicillin-sulbactam (UNASYN) IV 3 g (10/04/21 0411)   lactated ringers 10 mL/hr at 10/03/21 1740   pantoprazole 8 mg/hr (10/04/21 0239)   KAJ:[id number] **OR** acetaminophen, albuterol, mouth rinse  Antibiotics: Anti-infectives (From admission, onward)    Start     Dose/Rate Route Frequency Ordered Stop   09/30/21 2200  Ampicillin-Sulbactam (UNASYN) 3 g in sodium chloride 0.9 % 100 mL IVPB        3 g 200 mL/hr over 30 Minutes Intravenous Every 6 hours 09/30/21 2146     09/30/21 1830  erythromycin 250 mg in sodium chloride 0.9 % 100 mL IVPB        250 mg 100 mL/hr over 60 Minutes Intravenous  Once 09/30/21 1801 09/30/21 2133   09/30/21 1800  erythromycin 205 mg in sodium chloride 0.9 % 100 mL IVPB  Status:  Discontinued        3 mg/kg  68.9 kg 100 mL/hr over 60 Minutes Intravenous  Once 09/30/21 1749 09/30/21 1801       Objective:  Vital Signs  Vitals:   10/03/21 1032 10/03/21 1343 10/03/21 2221 10/04/21 0507  BP: 108/61 122/60 129/67 (!) 131/94  Pulse: 71 64 67 70  Resp: '18 18 18 18  '$ Temp: 97.9 F (36.6 C) 97.9 F (36.6 C) 98.1 F (36.7 C) 98.5 F (36.9 C)  TempSrc: Oral Oral Oral   SpO2: 97% 98% 95% 94%  Weight:      Height:        Intake/Output Summary (Last 24 hours) at 10/04/2021 0914 Last data filed at 10/04/2021 0600 Gross per 24 hour  Intake 2694.33 ml  Output 1075 ml  Net 1619.33 ml    [employer]  09/30/21 1641 10/01/21 0621  Weight: 68.9 kg 70.6 kg    General appearance: Awake alert.  In no distress Resp: Diminished air entry at the bases with few crackles.  No wheezing or rhonchi.  Improved aeration compared to the last few days. Cardio:  S1-S2 is normal regular.  No S3-S4.  No rubs murmurs or bruit GI: Abdomen is soft.  Nontender nondistended.  Bowel sounds are present normal.  No masses organomegaly Extremities: No edema.  Full range of motion of lower extremities. Neurologic: Alert and oriented x3.  No focal neurological deficits.      Lab Results:  Data Reviewed: I have personally reviewed following labs and reports of the imaging studies  CBC: Recent Labs  Lab 09/30/21 1740 09/30/21 2103 10/01/21 0435 10/01/21 1013 10/01/21 1847 10/02/21 0247 10/02/21 1356 10/03/21 0738 10/04/21 0236  WBC 11.9*   < > 15.0* 14.2*  --  11.9*  --  12.8* 10.3  NEUTROABS 8.8*  --   --   --   --   --   --   --   --   HGB 12.0*   < > 9.6* 9.4* 9.2* 8.7* 8.9* 9.2* 9.6*  HCT 37.6*   < > 29.9* 29.0* 29.1* 28.1* 28.0* 29.4* 30.4*  MCV 93.3   < > 94.9 94.5  --  97.2  --  95.8 93.0  PLT 248   < > 195 184  --  177  --  199 195   < > = values in this interval not displayed.     Basic Metabolic Panel: Recent Labs  Lab 09/30/21 1740 09/30/21 2103 10/01/21 0435 10/02/21 0247 10/03/21 0811 10/04/21 0236  NA 137  --  141 143 140 137  K 3.8  --  4.0 3.6 3.9 3.5  CL 108  --  113* 116* 113* 113*  CO2 23  --  23 23 19* 21*  GLUCOSE 133*  --  116* 86 65* 93  BUN 40*  --  28* '19 16 13  '$ CREATININE 0.95  --  0.93 0.95 0.90 0.87  CALCIUM 8.5*  --  7.7* 7.7* 7.6* 7.5*  MG  --  2.0  --  2.0  --   --   PHOS  --  2.4*  --   --   --   --      GFR: Estimated Creatinine Clearance: 55.9 mL/min (by C-G formula based on SCr of 0.87 mg/dL).  Liver Function Tests: Recent Labs  Lab 09/30/21 1740 10/01/21 0435  AST 23 18  ALT 21 16  ALKPHOS 74 55  BILITOT 0.5 0.6  PROT 7.0 5.4*  ALBUMIN 3.8 2.9*     Recent Labs  Lab 09/30/21 1740  LIPASE 35     Coagulation Profile: Recent Labs  Lab 09/30/21 1740  INR 1.0     Cardiac Enzymes: Recent Labs  Lab 09/30/21 2103  CKTOTAL 85      CBG: Recent Labs  Lab  09/30/21 1701 10/03/21 0932 10/03/21 1718 10/03/21 2222 10/04/21 0715  GLUCAP 136* 89 158* 91 82       Recent Results (from the past 240 hour(s))  Expectorated Sputum Assessment w Gram Stain, Rflx to Resp Cult     Status: None   Collection Time: 10/01/21  2:41 AM   Specimen: Expectorated Sputum  Result Value Ref Range Status   Specimen Description EXPECTORATED SPUTUM  Final   Special Requests NONE  Final   Sputum evaluation   Final  THIS SPECIMEN IS ACCEPTABLE FOR SPUTUM CULTURE Performed at [facility], [place] [address] [place], [place] [address]    Report Status 10/01/2021 FINAL  Final  Culture, Respiratory w Gram Stain     Status: None   Collection Time: 10/01/21  2:41 AM  Result Value Ref Range Status   Specimen Description   Final    EXPECTORATED SPUTUM Performed at [facility], [place] [address], [place], [place] [address]    Special Requests   Final    NONE Reflexed from [id number] Performed at [facility], [place] [address], [place], [place] [address]    Gram Stain   Final    FEW SQUAMOUS EPITHELIAL CELLS PRESENT FEW WBC PRESENT, PREDOMINANTLY MONONUCLEAR FEW GRAM NEGATIVE RODS FEW GRAM POSITIVE COCCI IN PAIRS    Culture   Final    FEW Normal respiratory flora-no Staph aureus or Pseudomonas seen Performed at [facility], [address] [place], [place] [address]    Report Status 10/03/2021 FINAL  Final  MRSA Next Gen by PCR, Nasal     Status: None   Collection Time: 10/01/21  4:30 AM   Specimen: Nasal Mucosa; Nasal Swab  Result Value Ref Range Status   MRSA by PCR Next Gen NOT DETECTED NOT DETECTED Final    Comment: (NOTE) The GeneXpert MRSA Assay (FDA approved for NASAL specimens only), is one component of a comprehensive MRSA colonization surveillance program. It is not intended to diagnose MRSA infection nor to guide or monitor treatment for MRSA infections. Test performance is not FDA  approved in patients less than 60 years old. Performed at [facility], [place] [address] [place], [place] [address]       Radiology Studies: No results found.     LOS: 4 days   Shonta Bourque [employer] on [email or web address]  10/04/2021, 9:14 AM

## 2021-10-04 NOTE — Care Management Important Message (Signed)
Important Message  Patient Details IM Letter placed in Patients room. Name: [name] MRN: [id number] Date of Birth: 05/03/1932   Medicare Important Message Given:  Yes     Kerin Salen 10/04/2021, 2:44 PM

## 2021-10-04 NOTE — Progress Notes (Addendum)
Daily Progress Note  Hospital Day: 5  Chief Complaint: swallowing problems, vomiting blood  Brief History [name] is a 88 y.o. male with a pmh not limited to CAD, COPD, Afib not anti-coagulated, hypothyroidism, BPH, thoracic aortic aneurysm, GERD with Barrett's esophagus, esophageal strictures. Admitted several days ago with chest pain, hematemesis and dysphagia.    Assessment / Plan   # 88 yo male with hematemesis, drop in hgb and dysphagia. EGD with findings of severe esophagitis with bleeding s/p APC, esophageal ulcer with stigmata of recent bleeding, large hiatal hernia.  Hgb stable at 9.4. No further vomiting / hematemesis. Tolerating clears.  Advance to soft diet. Continue sucralfate suspension 1 gram PO QID. Continue IV PPI Consider repeat EGD in 8 weeks to reassess esophagitis and consider repeat dilation  # Aspiration pneumonia     [facility] Attending   I have taken an interval history, reviewed the chart and examined the patient. I agree with the Advanced Practitioner's note, impression and recommendations.    Gatha Mayer, MD, [facility] See Shea Evans on call - gastroenterology for best contact person 10/04/2021 5:44 PM     Subjective   Feels okay. Tolerating clears. No vomiting or hematemesis just " spitting" up some old blood. Says he may go home tomorrow.   Objective   Endoscopic studies:  EGD  - The examined portions of the nasopharynx, oropharynx and larynx were normal. - Clotted blood in the lower third of the esophagus. - Severe esophagitis with bleeding. Treated with argon plasma coagulation (APC). This esophagitis may be reflux or stasis in etiology - Esophageal ulcer with stigmata of recent bleeding. - Large hiatal hernia. - Normal. - No specimens collected. Impression: Not Applicable - Patient had care per Anesthesia. Moderate Sedation: - Return patient to hospital ward for ongoing care. - NImaging:    CT  Angio Chest/Abd/Pel for Dissection W and/or W/WO  Result Date: 09/30/2021 CLINICAL DATA:  Acute aortic syndrome suspected. Cough with sputum and bright red blood. History of Barrett's esophagus. EXAM: CT ANGIOGRAPHY CHEST, ABDOMEN AND PELVIS TECHNIQUE: Non-contrast CT of the chest was initially obtained. Multidetector CT imaging through the chest, abdomen and pelvis was performed using the standard protocol during bolus administration of intravenous contrast. Multiplanar reconstructed images and MIPs were obtained and reviewed to evaluate the vascular anatomy. RADIATION DOSE REDUCTION: This exam was performed according to the departmental dose-optimization program which includes automated exposure control, adjustment of the mA and/or kV according to patient size and/or use of iterative reconstruction technique. CONTRAST:  117m OMNIPAQUE IOHEXOL 350 MG/ML SOLN COMPARISON:  CT chest 06/09/2020.  CT abdomen and pelvis 10/05/2018 FINDINGS: CTA CHEST FINDINGS Cardiovascular: Noncontrast images of the chest demonstrate calcification in the aorta and coronary arteries. No evidence of intramural hematoma. Images obtained during the arterial phase after contrast material injection demonstrates an ascending aortic aneurysm measuring 4.2 cm in diameter, unchanged since prior study. No evidence of aortic dissection. Calcified and noncalcified plaque formation mostly in the descending aorta. Great vessel origins are patent. Central pulmonary arteries appear patent without evidence of significant pulmonary embolus. Normal heart size. No pericardial effusions. Mediastinum/Nodes: Thyroid gland is atrophic. No significant lymphadenopathy in the chest. The esophagus is diffusely distended and thick-walled with internal debris. This is likely related to the patient's history of esophagitis with Barretts esophagus. The degree of esophageal distention is increased since the prior study and the possibility of a developing obstructing  lesion should be considered. Suggest follow-up with  gastroenterology for consideration of endoscopy. Lungs/Pleura: Emphysematous changes in the lungs. Mild infiltration in the lung bases. Bronchiectasis with opacification of lower lobe bronchi, possibly secretions or aspiration. Apical pleural calcifications are greater on the left, likely postinflammatory. No pleural effusions. Musculoskeletal: Old rib fractures. Compression of the T8 vertebral body is unchanged since prior study. Review of the MIP images confirms the above findings. CTA ABDOMEN AND PELVIS FINDINGS VASCULAR Aorta: Normal caliber aorta without aneurysm, dissection, vasculitis or significant stenosis. Diffuse aortic calcification. Celiac: Celiac axis supplies the splenic artery and left gastric artery. Vessels appear patent without evidence of aneurysm, occlusion, or dissection. SMA: The hepatic artery arises from the superior mesenteric artery. Both vessels are patent without evidence of aneurysm, occlusion, or dissection. Renals: Both renal arteries are patent without evidence of aneurysm, dissection, vasculitis, fibromuscular dysplasia or significant stenosis. IMA: Patent without evidence of aneurysm, dissection, vasculitis or significant stenosis. Inflow: Patent without evidence of aneurysm, dissection, vasculitis or significant stenosis. Veins: No obvious venous abnormality within the limitations of this arterial phase study. Review of the MIP images confirms the above findings. NON-VASCULAR Hepatobiliary: No focal liver abnormality is seen. No gallstones, gallbladder wall thickening, or biliary dilatation. Pancreas: Unremarkable. No pancreatic ductal dilatation or surrounding inflammatory changes. Spleen: Normal in size without focal abnormality. Adrenals/Urinary Tract: Adrenal glands are unremarkable. Kidneys are normal, without renal calculi, focal lesion, or hydronephrosis. Bladder is unremarkable. Stomach/Bowel: Stomach, small bowel, and  colon are not abnormally distended. Stool fills the colon. No wall thickening or inflammatory changes are appreciated. Appendix is normal. Lymphatic: No significant lymphadenopathy. Reproductive: Prostate gland is not enlarged. Visualization is limited due to streak artifact from hip prostheses. Other: No free air or free fluid in the abdomen. Abdominal wall musculature appears intact. Musculoskeletal: Bilateral total hip arthroplasties. Degenerative changes in the lumbar spine. Review of the MIP images confirms the above findings. IMPRESSION: 1. Diffuse aortic atherosclerosis. No evidence of aortic dissection. 2. 4.2 cm diameter ascending thoracic aortic aneurysm. Recommend annual imaging followup by CTA or MRA. This recommendation follows 2010 ACCF/AHA/AATS/ACR/ASA/SCA/SCAI/SIR/STS/SVM Guidelines for the Diagnosis and Management of Patients with Thoracic Aortic Disease. Circulation. 2010; 121: I433-I951. Aortic aneurysm NOS (ICD10-I71.9) 3. Bronchiectasis with mucous plugging versus secretions in the lung bases as well as peribronchial infiltrates. Changes may represent aspiration or pneumonia. 4. Increased distention and thick-walled appearance of the esophagus since prior study. Changes likely related to history of Barrett's esophagus and esophagitis. Consider gastroenterology referral to exclude obstructing lesion. Electronically Signed   By: Lucienne Capers M.D.   On: 09/30/2021 19:40   DG Chest Portable 1 View  Result Date: 09/30/2021 CLINICAL DATA:  Cough, hemoptysis EXAM: PORTABLE CHEST 1 VIEW COMPARISON:  06/28/2021 FINDINGS: Cardiac size is within normal limits. There are no signs of pulmonary edema. Left hemidiaphragm is elevated. There is interval clearing of linear density in the lateral aspect of the right lower lung field. New linear densities are seen in medial right lower lung field. There is no significant pleural effusion or pneumothorax. There are multiple old healed fractures in the left  ribs. IMPRESSION: Small new linear densities in the medial right lower lung fields suggest subsegmental atelectasis. There are no signs of pulmonary edema or focal pulmonary consolidation. Electronically Signed   By: Elmer Picker M.D.   On: 09/30/2021 18:31    Lab Results: Recent Labs    10/02/21 0247 10/02/21 1356 10/03/21 0738 10/04/21 0236  WBC 11.9*  --  12.8* 10.3  HGB 8.7* 8.9* 9.2* 9.6*  HCT  28.1* 28.0* 29.4* 30.4*  PLT 177  --  199 195   BMET Recent Labs    10/02/21 0247 10/03/21 0811 10/04/21 0236  NA 143 140 137  K 3.6 3.9 3.5  CL 116* 113* 113*  CO2 23 19* 21*  GLUCOSE 86 65* 93  BUN '19 16 13  '$ CREATININE 0.95 0.90 0.87  CALCIUM 7.7* 7.6* 7.5*   LFT No results for input(s): "PROT", "ALBUMIN", "AST", "ALT", "ALKPHOS", "BILITOT", "BILIDIR", "IBILI" in the last 72 hours. PT/INR No results for input(s): "LABPROT", "INR" in the last 72 hours.   Scheduled inpatient medications:   cabergoline  0.25 mg Oral Once per day on Mon Thu   Chlorhexidine Gluconate Cloth  6 each Topical Daily   feeding supplement  1 Container Oral TID WC   fluticasone furoate-vilanterol  1 puff Inhalation Daily   And   umeclidinium bromide  1 puff Inhalation Daily   levothyroxine  75 mcg Oral Daily   pantoprazole  40 mg Intravenous Q12H   potassium chloride  40 mEq Oral Once   sucralfate  1 g Oral TID WC & HS   tamsulosin  0.4 mg Oral QPC supper   Continuous inpatient infusions:   ampicillin-sulbactam (UNASYN) IV 3 g (10/04/21 0411)   lactated ringers 10 mL/hr at 10/03/21 1740   pantoprazole 8 mg/hr (10/04/21 0239)   PRN inpatient medications: acetaminophen **OR** acetaminophen, albuterol, mouth rinse  Vital signs in last 24 hours: Temp:  [97.9 F (36.6 C)-98.5 F (36.9 C)] 98.5 F (36.9 C) (08/07 0507) Pulse Rate:  [64-71] 70 (08/07 0507) Resp:  [18] 18 (08/07 0507) BP: (108-131)/(60-94) 131/94 (08/07 0507) SpO2:  [94 %-98 %] 94 % (08/07 0507) Last BM Date :  10/01/21  Intake/Output Summary (Last 24 hours) at 10/04/2021 0910 Last data filed at 10/04/2021 0600 Gross per 24 hour  Intake 2694.33 ml  Output 1075 ml  Net 1619.33 ml     Physical Exam:  General: Alert male in NAD Heart:  Regular rate and rhythm. No lower extremity edema Pulmonary: Normal respiratory effort. Course breath sounds bilaterally Abdomen: Soft, nondistended, nontender. Normal bowel sounds.  Neurologic: Alert and oriented Psych: Pleasant. Cooperative.    Intake/Output from previous day: 08/06 0701 - 08/07 0700 In: 2694.3 [P.O.:1920; I.V.:552.7; IV Piggyback:221.7] Out: 1075 [Urine:1075] Intake/Output this shift: No intake/output data recorded.    Principal Problem:   Upper GI bleed Active Problems:   PITUITARY ADENOMA   Hypothyroidism   Hyperlipidemia LDL goal <70   BPH (benign prostatic hyperplasia)   COPD (chronic obstructive pulmonary disease) (HCC)   Afib (HCC)   Thoracic ascending aortic aneurysm (HCC)   Aspiration pneumonia (HCC)   Ulcer of esophagus with bleeding     LOS: 4 days   Tye Savoy ,NP 10/04/2021, 9:10 AM

## 2021-10-05 DIAGNOSIS — K922 Gastrointestinal hemorrhage, unspecified: Secondary | ICD-10-CM | POA: Diagnosis not present

## 2021-10-05 LAB — CBC
HCT: 30 % — ABNORMAL LOW (ref 39.0–52.0)
Hemoglobin: 9.8 g/dL — ABNORMAL LOW (ref 13.0–17.0)
MCH: 30.3 pg (ref 26.0–34.0)
MCHC: 32.7 g/dL (ref 30.0–36.0)
MCV: 92.9 fL (ref 80.0–100.0)
Platelets: 225 10*3/uL (ref 150–400)
RBC: 3.23 MIL/uL — ABNORMAL LOW (ref 4.22–5.81)
RDW: 16.1 % — ABNORMAL HIGH (ref 11.5–15.5)
WBC: 11.1 10*3/uL — ABNORMAL HIGH (ref 4.0–10.5)
nRBC: 0 % (ref 0.0–0.2)

## 2021-10-05 LAB — BASIC METABOLIC PANEL
Anion gap: 7 (ref 5–15)
BUN: 21 mg/dL (ref 8–23)
CO2: 20 mmol/L — ABNORMAL LOW (ref 22–32)
Calcium: 8.2 mg/dL — ABNORMAL LOW (ref 8.9–10.3)
Chloride: 113 mmol/L — ABNORMAL HIGH (ref 98–111)
Creatinine, Ser: 0.93 mg/dL (ref 0.61–1.24)
GFR, Estimated: >60 mL/min (ref >60)
Glucose, Bld: 98 mg/dL (ref 70–99)
Potassium: 3.9 mmol/L (ref 3.5–5.1)
Sodium: 140 mmol/L (ref 135–145)

## 2021-10-05 LAB — MAGNESIUM: Magnesium: 1.9 mg/dL (ref 1.7–2.4)

## 2021-10-05 MED ORDER — AMOXICILLIN-POT CLAVULANATE 875-125 MG PO TABS: 1.0000 | ORAL_TABLET | Freq: Two times a day (BID) | ORAL | 0 refills | Status: AC

## 2021-10-05 MED ORDER — GUAIFENESIN ER 600 MG PO TB12: 600.0000 mg | ORAL_TABLET | Freq: Two times a day (BID) | ORAL | 0 refills | Status: DC

## 2021-10-05 MED ORDER — SUCRALFATE 1 G PO TABS: 1.0000 g | ORAL_TABLET | Freq: Three times a day (TID) | ORAL | 1 refills | Status: DC

## 2021-10-05 NOTE — Progress Notes (Signed)
Daily Progress Note  Hospital Day: 6  Chief Complaint: swallowing problems, vomiting blood  Brief History [name] is a 88 y.o. male with a pmh not limited to CAD, COPD, Afib not anti-coagulated, hypothyroidism, BPH, thoracic aortic aneurysm, GERD with Barrett's esophagus, hiatal hernia with recurrence, esophageal strictures. Admitted several days ago with chest pain, hematemesis and dysphagia.    Assessment / Plan   # 88 yo male with hematemesis, drop in hgb and dysphagia. EGD with findings of severe esophagitis with bleeding s/p APC, esophageal ulcer with stigmata of recent bleeding, large hiatal hernia.  Hgb stable at 9.8. No further vomiting / hematemesis.  Tolerating solid ( no dysphagia) Continue sucralfate suspension 1 gram PO QID x 2 weeks. GFR >60 Continue PO BID PPI until seen in office for follow up Consider repeat EGD in 8 weeks to reassess esophagitis and consider repeat dilation Follow up with me 11/03/21 at 11am    # Aspiration pneumonia / hemoptysis. Hemoptysis likely from blood that he may have aspirated after his episodes of hematemesis.   CT angiogram did show just changes of bronchiectasis with mucous plugging.  Concern for aspiration pneumonia. On Augmenting    Subjective   Feels fine. Ambulating in room. No further hematemesis. Swallowing issues resolved.   Objective   Endoscopic studies:  EGD  - The examined portions of the nasopharynx, oropharynx and larynx were normal. - Clotted blood in the lower third of the esophagus. - Severe esophagitis with bleeding. Treated with argon plasma coagulation (APC). This esophagitis may be reflux or stasis in etiology - Esophageal ulcer with stigmata of recent bleeding. - Large hiatal hernia. - Normal. - No specimens collected.  Lab Results: Recent Labs    10/03/21 0738 10/04/21 0236 10/05/21 0216  WBC 12.8* 10.3 11.1*  HGB 9.2* 9.6* 9.8*  HCT 29.4* 30.4* 30.0*  PLT 199 195 225   BMET Recent  Labs    10/03/21 0811 10/04/21 0236 10/05/21 0216  NA 140 137 140  K 3.9 3.5 3.9  CL 113* 113* 113*  CO2 19* 21* 20*  GLUCOSE 65* 93 98  BUN '16 13 21  '$ CREATININE 0.90 0.87 0.93  CALCIUM 7.6* 7.5* 8.2*   LFT No results for input(s): "PROT", "ALBUMIN", "AST", "ALT", "ALKPHOS", "BILITOT", "BILIDIR", "IBILI" in the last 72 hours. PT/INR No results for input(s): "LABPROT", "INR" in the last 72 hours.   Scheduled inpatient medications:   amoxicillin-clavulanate  1 tablet Oral Q12H   cabergoline  0.25 mg Oral Once per day on Mon Thu   feeding supplement  1 Container Oral TID WC   fluticasone furoate-vilanterol  1 puff Inhalation Daily   And   umeclidinium bromide  1 puff Inhalation Daily   levothyroxine  75 mcg Oral Daily   pantoprazole  40 mg Oral BID   sucralfate  1 g Oral TID WC & HS   tamsulosin  0.4 mg Oral QPC supper   Continuous inpatient infusions:  PRN inpatient medications: acetaminophen **OR** acetaminophen, albuterol, mouth rinse  Vital signs in last 24 hours: Temp:  [97.3 F (36.3 C)-98.3 F (36.8 C)] 97.7 F (36.5 C) (08/08 0902) Pulse Rate:  [75-96] 86 (08/08 0902) Resp:  [17-20] 17 (08/08 0902) BP: (114-131)/(66-83) 131/78 (08/08 0902) SpO2:  [92 %-98 %] 98 % (08/08 0902) Last BM Date : 10/01/21  Intake/Output Summary (Last 24 hours) at 10/05/2021 0943 Last data filed at 10/05/2021 0800 Gross per 24 hour  Intake 902 ml  Output 550  ml  Net 352 ml     Physical Exam:  General: Alert male in NAD Heart:  Regular rate  Pulmonary: Coarse breath sounds bilaterally. No wheezing. Abdomen: Soft, nondistended, nontender. Normal bowel sounds.  Neurologic: Alert and oriented Psych: Pleasant. Cooperative.    Intake/Output from previous day: 08/07 0701 - 08/08 0700 In: 2947 [P.O.:1140; I.V.:37] Out: 250 [Urine:250] Intake/Output this shift: Total I/O In: -  Out: 300 [Urine:300]    Principal Problem:   Upper GI bleed Active Problems:   PITUITARY  ADENOMA   Hypothyroidism   Hyperlipidemia LDL goal <70   BPH (benign prostatic hyperplasia)   COPD (chronic obstructive pulmonary disease) (HCC)   Afib (HCC)   Thoracic ascending aortic aneurysm (HCC)   Aspiration pneumonia (HCC)   Ulcer of esophagus with bleeding     LOS: 5 days   Tye Savoy ,NP 10/05/2021, 9:43 AM

## 2021-10-05 NOTE — Discharge Summary (Signed)
[facility]  Physician Discharge Summary   Patient ID: [name] MRN: [id number] DOB/AGE: [birth year] 88 y.o.  Admit date: 09/30/2021 Discharge date: 10/05/2021    PCP: Dettinger, [name], MD  DISCHARGE DIAGNOSES:  Principal Problem:   Upper GI bleed Active Problems:   PITUITARY ADENOMA   Hypothyroidism   Hyperlipidemia LDL goal <70   BPH (benign prostatic hyperplasia)   COPD (chronic obstructive pulmonary disease) (HCC)   Afib (HCC)   Thoracic ascending aortic aneurysm (HCC)   Aspiration pneumonia (HCC)   Ulcer of esophagus with bleeding   RECOMMENDATIONS FOR OUTPATIENT FOLLOW UP: Gastroenterology to arrange outpatient follow-up   Home Health: PT Equipment/Devices: None  CODE STATUS: Full code  DISCHARGE CONDITION: fair  Diet recommendation: Soft bland diet  INITIAL HISTORY: 88 y.o. male with medical history significant of GERD Berretts esophagus, pituitary microadenoma who presented with hemoptysis versus hematemesis.  It looks like initially patient did have episodes of hematemesis which was followed by episodes of hemoptysis.  Patient was hospitalized for further management.   Consultants: Gastroenterology   Procedures:  EGD 8/5 Impression:               - The examined portions of the nasopharynx,                            oropharynx and larynx were normal.                           - Clotted blood in the lower third of the esophagus.                           - Severe esophagitis with bleeding. Treated with                            argon plasma coagulation (APC). This esophagitis                            may be reflux or stasis in etiology                           - Esophageal ulcer with stigmata of recent bleeding.                           - Large hiatal hernia.                           - Normal.                           - No specimens collected.    HOSPITAL COURSE:   Upper GI bleed/dysphagia There was some confusion initially  whether patient had hematemesis or hemoptysis.  Looks like patient initially with vomiting of blood and then this was followed by hemoptysis.  Patient not on any anticoagulants.   Patient was seen by gastroenterology.  Underwent EGD on 8/5 which showed evidence for severe esophagitis with bleeding.  Treated with APC.  Also found to have clotted blood in the lower third of the esophagus which was the likely reason for his dysphagia. Diet was gradually advanced.  PPI was changed to twice  daily.  Started on Carafate. Seems to be doing much better.  Tolerated his diet.  Looking forward to going home today.  Acute blood loss anemia Presented with hemoglobin of 12.  Hemoglobin dropped to 8.7 but has been stable in the last 48 hours.  Did not require transfusion   Aspiration pneumonia/hemoptysis Hemoptysis likely from blood that he may have aspirated after his episodes of hematemesis.   CT angiogram did show just changes of bronchiectasis with mucous plugging.  Concern for aspiration pneumonia.  Patient started on Unasyn.  Respiratory status stable.  Procalcitonin noted to be less than 0.1. Changed to Augmentin.   History of atrial fibrillation, paroxysmal Not on anticoagulation.  Electrolytes repleted.  Aspirin can be resumed per GI.   History of COPD Stable.   BPH Continue Flomax.  Foley catheter was discontinued.  He has been able to void.   Pituitary adenoma Followed by endocrinology as outpatient.  Noted to be on cabergoline.  Thoracic aortic aneurysm No evidence for dissection on CT angiogram.  Outpatient monitoring.   Hypothyroidism Continue levothyroxine.   Patient is stable.  Okay for discharge home today.   PERTINENT LABS:  The results of significant diagnostics from this hospitalization (including imaging, microbiology, ancillary and laboratory) are listed below for reference.    Microbiology: Recent Results (from the past 240 hour(s))  Expectorated Sputum Assessment w  Gram Stain, Rflx to Resp Cult     Status: None   Collection Time: 10/01/21  2:41 AM   Specimen: Expectorated Sputum  Result Value Ref Range Status   Specimen Description EXPECTORATED SPUTUM  Final   Special Requests NONE  Final   Sputum evaluation   Final    THIS SPECIMEN IS ACCEPTABLE FOR SPUTUM CULTURE Performed at [facility], [place] [address], [place], [place] [address]    Report Status 10/01/2021 FINAL  Final  Culture, Respiratory w Gram Stain     Status: None   Collection Time: 10/01/21  2:41 AM  Result Value Ref Range Status   Specimen Description   Final    EXPECTORATED SPUTUM Performed at [facility], [place] [address], [place], [place] [address]    Special Requests   Final    NONE Reflexed from [id number] Performed at [facility], [place] [address], [place], [place] [address]    Gram Stain   Final    FEW SQUAMOUS EPITHELIAL CELLS PRESENT FEW WBC PRESENT, PREDOMINANTLY MONONUCLEAR FEW GRAM NEGATIVE RODS FEW GRAM POSITIVE COCCI IN PAIRS    Culture   Final    FEW Normal respiratory flora-no Staph aureus or Pseudomonas seen Performed at [facility], [address], [place], [place] [address]    Report Status 10/03/2021 FINAL  Final  MRSA Next Gen by PCR, Nasal     Status: None   Collection Time: 10/01/21  4:30 AM   Specimen: Nasal Mucosa; Nasal Swab  Result Value Ref Range Status   MRSA by PCR Next Gen NOT DETECTED NOT DETECTED Final    Comment: (NOTE) The GeneXpert MRSA Assay (FDA approved for NASAL specimens only), is one component of a comprehensive MRSA colonization surveillance program. It is not intended to diagnose MRSA infection nor to guide or monitor treatment for MRSA infections. Test performance is not FDA approved in patients less than 37 years old. Performed at [facility], [place] [address], [place], [place] [address]      Labs:   Basic Metabolic Panel: Recent Labs  Lab  09/30/21 2103 10/01/21 0435 10/02/21 0247 10/03/21 0811 10/04/21 0236 10/05/21 0216  NA  --  141 143 140 137 140  K  --  4.0 3.6 3.9 3.5 3.9  CL  --  113* 116* 113* 113* 113*  CO2  --  23 23 19* 21* 20*  GLUCOSE  --  116* 86 65* 93 98  BUN  --  28* '19 16 13 21  '$ CREATININE  --  0.93 0.95 0.90 0.87 0.93  CALCIUM  --  7.7* 7.7* 7.6* 7.5* 8.2*  MG 2.0  --  2.0  --   --  1.9  PHOS 2.4*  --   --   --   --   --    Liver Function Tests: Recent Labs  Lab 09/30/21 1740 10/01/21 0435  AST 23 18  ALT 21 16  ALKPHOS 74 55  BILITOT 0.5 0.6  PROT 7.0 5.4*  ALBUMIN 3.8 2.9*   Recent Labs  Lab 09/30/21 1740  LIPASE 35    CBC: Recent Labs  Lab 09/30/21 1740 09/30/21 2103 10/01/21 1013 10/01/21 1847 10/02/21 0247 10/02/21 1356 10/03/21 0738 10/04/21 0236 10/05/21 0216  WBC 11.9*   < > 14.2*  --  11.9*  --  12.8* 10.3 11.1*  NEUTROABS 8.8*  --   --   --   --   --   --   --   --   HGB 12.0*   < > 9.4*   < > 8.7* 8.9* 9.2* 9.6* 9.8*  HCT 37.6*   < > 29.0*   < > 28.1* 28.0* 29.4* 30.4* 30.0*  MCV 93.3   < > 94.5  --  97.2  --  95.8 93.0 92.9  PLT 248   < > 184  --  177  --  199 195 225   < > = values in this interval not displayed.   Cardiac Enzymes: Recent Labs  Lab 09/30/21 2103  CKTOTAL 85    CBG: Recent Labs  Lab 10/03/21 0932 10/03/21 1718 10/03/21 2222 10/04/21 0715 10/04/21 2118  GLUCAP 89 158* 91 82 127*     IMAGING STUDIES ECHOCARDIOGRAM COMPLETE  Result Date: 10/01/2021    ECHOCARDIOGRAM REPORT   Patient Name:   [name] Date of Exam: 10/01/2021 Medical Rec #:  [id number]       Height:       67.0 in Accession #:    [id number]      Weight:       155.6 lb Date of Birth:  02-27-1933       BSA:          1.818 m Patient Age:    13 years        BP:           98/51 mmHg Patient Gender: M               HR:           66 bpm. Exam Location:  Inpatient Procedure: 2D Echo, Color Doppler and Cardiac Doppler Indications:    R07.9* Chest pain, unspecified   History:        Patient has prior history of Echocardiogram examinations, most                 recent 06/14/2015. CAD, COPD, Arrythmias:Atrial Fibrillation;                 Risk Factors:Dyslipidemia.  Sonographer:    [place]  Referring Phys: [place]  1. Left ventricular ejection fraction, by estimation, is 60 to 65%. The left ventricle has normal function. The left ventricle has no regional wall motion abnormalities. Left ventricular diastolic parameters are consistent with Grade II diastolic dysfunction (pseudonormalization).  2. Right ventricular systolic function is normal. The right ventricular size is normal. There is normal pulmonary artery systolic pressure. The estimated right ventricular systolic pressure is 31.4 mmHg.  3. Left atrial size was mildly dilated.  4. Right atrial size was mildly dilated.  5. The mitral valve is normal in structure. Mild mitral valve regurgitation. No evidence of mitral stenosis.  6. The aortic valve is tricuspid. Aortic valve regurgitation is trivial. No aortic stenosis is present.  7. Aortic dilatation noted. There is mild dilatation of the aortic root, measuring 38 mm.  8. The inferior vena cava is normal in size with greater than 50% respiratory variability, suggesting right atrial pressure of 3 mmHg. FINDINGS  Left Ventricle: Left ventricular ejection fraction, by estimation, is 60 to 65%. The left ventricle has normal function. The left ventricle has no regional wall motion abnormalities. The left ventricular internal cavity size was normal in size. There is  no left ventricular hypertrophy. Left ventricular diastolic parameters are consistent with Grade II diastolic dysfunction (pseudonormalization). Right Ventricle: The right ventricular size is normal. No increase in right ventricular wall thickness. Right ventricular systolic function is normal. There is normal pulmonary artery systolic pressure. The tricuspid regurgitant velocity is  2.61 m/s, and  with an assumed right atrial pressure of 3 mmHg, the estimated right ventricular systolic pressure is 97.0 mmHg. Left Atrium: Left atrial size was mildly dilated. Right Atrium: Right atrial size was mildly dilated. Pericardium: There is no evidence of pericardial effusion. Mitral Valve: The mitral valve is normal in structure. Mild mitral valve regurgitation. No evidence of mitral valve stenosis. Tricuspid Valve: The tricuspid valve is normal in structure. Tricuspid valve regurgitation is trivial. Aortic Valve: The aortic valve is tricuspid. Aortic valve regurgitation is trivial. No aortic stenosis is present. Pulmonic Valve: The pulmonic valve was normal in structure. Pulmonic valve regurgitation is not visualized. Aorta: Aortic dilatation noted. There is mild dilatation of the aortic root, measuring 38 mm. Venous: The inferior vena cava is normal in size with greater than 50% respiratory variability, suggesting right atrial pressure of 3 mmHg. IAS/Shunts: No atrial level shunt detected by color flow Doppler.  LEFT VENTRICLE PLAX 2D LVIDd:         4.20 cm   Diastology LVIDs:         2.00 cm   LV e' medial:    7.18 cm/s LV PW:         0.80 cm   LV E/e' medial:  9.7 LV IVS:        1.10 cm   LV e' lateral:   9.14 cm/s LVOT diam:     2.10 cm   LV E/e' lateral: 7.6 LV SV:         79 LV SV Index:   44 LVOT Area:     3.46 cm  RIGHT VENTRICLE RV S prime:     14.10 cm/s TAPSE (M-mode): 2.3 cm LEFT ATRIUM             Index        RIGHT ATRIUM           Index LA diam:        3.30 cm 1.82 cm/m   RA  Area:     19.30 cm LA Vol (A2C):   93.0 ml 51.16 ml/m  RA Volume:   46.90 ml  25.80 ml/m LA Vol (A4C):   41.5 ml 22.83 ml/m LA Biplane Vol: 61.9 ml 34.05 ml/m  AORTIC VALVE LVOT Vmax:   94.60 cm/s LVOT Vmean:  65.000 cm/s LVOT VTI:    0.229 m  AORTA Ao Root diam: 3.80 cm Ao Asc diam:  3.70 cm MITRAL VALVE               TRICUSPID VALVE MV Area (PHT): 3.17 cm    TR Peak grad:   27.2 mmHg MV Decel Time: 239 msec     TR Vmax:        261.00 cm/s MV E velocity: 69.40 cm/s MV A velocity: 63.00 cm/s  SHUNTS MV E/A ratio:  1.10        Systemic VTI:  0.23 m                            Systemic Diam: 2.10 cm Dalton McleanMD Electronically signed by Franki Monte Signature Date/Time: 10/01/2021/2:07:07 PM    Final    CT Angio Chest/Abd/Pel for Dissection W and/or W/WO  Result Date: 09/30/2021 CLINICAL DATA:  Acute aortic syndrome suspected. Cough with sputum and bright red blood. History of Barrett's esophagus. EXAM: CT ANGIOGRAPHY CHEST, ABDOMEN AND PELVIS TECHNIQUE: Non-contrast CT of the chest was initially obtained. Multidetector CT imaging through the chest, abdomen and pelvis was performed using the standard protocol during bolus administration of intravenous contrast. Multiplanar reconstructed images and MIPs were obtained and reviewed to evaluate the vascular anatomy. RADIATION DOSE REDUCTION: This exam was performed according to the departmental dose-optimization program which includes automated exposure control, adjustment of the mA and/or kV according to patient size and/or use of iterative reconstruction technique. CONTRAST:  166m OMNIPAQUE IOHEXOL 350 MG/ML SOLN COMPARISON:  CT chest 06/09/2020.  CT abdomen and pelvis 10/05/2018 FINDINGS: CTA CHEST FINDINGS Cardiovascular: Noncontrast images of the chest demonstrate calcification in the aorta and coronary arteries. No evidence of intramural hematoma. Images obtained during the arterial phase after contrast material injection demonstrates an ascending aortic aneurysm measuring 4.2 cm in diameter, unchanged since prior study. No evidence of aortic dissection. Calcified and noncalcified plaque formation mostly in the descending aorta. Great vessel origins are patent. Central pulmonary arteries appear patent without evidence of significant pulmonary embolus. Normal heart size. No pericardial effusions. Mediastinum/Nodes: Thyroid gland is atrophic. No significant  lymphadenopathy in the chest. The esophagus is diffusely distended and thick-walled with internal debris. This is likely related to the patient's history of esophagitis with Barretts esophagus. The degree of esophageal distention is increased since the prior study and the possibility of a developing obstructing lesion should be considered. Suggest follow-up with gastroenterology for consideration of endoscopy. Lungs/Pleura: Emphysematous changes in the lungs. Mild infiltration in the lung bases. Bronchiectasis with opacification of lower lobe bronchi, possibly secretions or aspiration. Apical pleural calcifications are greater on the left, likely postinflammatory. No pleural effusions. Musculoskeletal: Old rib fractures. Compression of the T8 vertebral body is unchanged since prior study. Review of the MIP images confirms the above findings. CTA ABDOMEN AND PELVIS FINDINGS VASCULAR Aorta: Normal caliber aorta without aneurysm, dissection, vasculitis or significant stenosis. Diffuse aortic calcification. Celiac: Celiac axis supplies the splenic artery and left gastric artery. Vessels appear patent without evidence of aneurysm, occlusion, or dissection. SMA: The hepatic artery arises from the superior mesenteric  artery. Both vessels are patent without evidence of aneurysm, occlusion, or dissection. Renals: Both renal arteries are patent without evidence of aneurysm, dissection, vasculitis, fibromuscular dysplasia or significant stenosis. IMA: Patent without evidence of aneurysm, dissection, vasculitis or significant stenosis. Inflow: Patent without evidence of aneurysm, dissection, vasculitis or significant stenosis. Veins: No obvious venous abnormality within the limitations of this arterial phase study. Review of the MIP images confirms the above findings. NON-VASCULAR Hepatobiliary: No focal liver abnormality is seen. No gallstones, gallbladder wall thickening, or biliary dilatation. Pancreas: Unremarkable. No  pancreatic ductal dilatation or surrounding inflammatory changes. Spleen: Normal in size without focal abnormality. Adrenals/Urinary Tract: Adrenal glands are unremarkable. Kidneys are normal, without renal calculi, focal lesion, or hydronephrosis. Bladder is unremarkable. Stomach/Bowel: Stomach, small bowel, and colon are not abnormally distended. Stool fills the colon. No wall thickening or inflammatory changes are appreciated. Appendix is normal. Lymphatic: No significant lymphadenopathy. Reproductive: Prostate gland is not enlarged. Visualization is limited due to streak artifact from hip prostheses. Other: No free air or free fluid in the abdomen. Abdominal wall musculature appears intact. Musculoskeletal: Bilateral total hip arthroplasties. Degenerative changes in the lumbar spine. Review of the MIP images confirms the above findings. IMPRESSION: 1. Diffuse aortic atherosclerosis. No evidence of aortic dissection. 2. 4.2 cm diameter ascending thoracic aortic aneurysm. Recommend annual imaging followup by CTA or MRA. This recommendation follows 2010 ACCF/AHA/AATS/ACR/ASA/SCA/SCAI/SIR/STS/SVM Guidelines for the Diagnosis and Management of Patients with Thoracic Aortic Disease. Circulation. 2010; 121: U932-T557. Aortic aneurysm NOS (ICD10-I71.9) 3. Bronchiectasis with mucous plugging versus secretions in the lung bases as well as peribronchial infiltrates. Changes may represent aspiration or pneumonia. 4. Increased distention and thick-walled appearance of the esophagus since prior study. Changes likely related to history of Barrett's esophagus and esophagitis. Consider gastroenterology referral to exclude obstructing lesion. Electronically Signed   By: Lucienne Capers M.D.   On: 09/30/2021 19:40   DG Chest Portable 1 View  Result Date: 09/30/2021 CLINICAL DATA:  Cough, hemoptysis EXAM: PORTABLE CHEST 1 VIEW COMPARISON:  06/28/2021 FINDINGS: Cardiac size is within normal limits. There are no signs of  pulmonary edema. Left hemidiaphragm is elevated. There is interval clearing of linear density in the lateral aspect of the right lower lung field. New linear densities are seen in medial right lower lung field. There is no significant pleural effusion or pneumothorax. There are multiple old healed fractures in the left ribs. IMPRESSION: Small new linear densities in the medial right lower lung fields suggest subsegmental atelectasis. There are no signs of pulmonary edema or focal pulmonary consolidation. Electronically Signed   By: Elmer Picker M.D.   On: 09/30/2021 18:31   DG Sacrum/Coccyx  Result Date: 09/10/2021 CLINICAL DATA:  Fall. EXAM: SACRUM AND COCCYX - 2+ VIEW COMPARISON:  AP pelvis 09/07/2012; CT abdomen and pelvis 09/20/2017 FINDINGS: There is diffuse decreased bone mineralization. Partial visualization of bilateral total hip arthroplasty hardware. Mild bilateral sacroiliac joint subchondral sclerosis greatest superiorly where there is moderate joint space narrowing. Normal sagittal alignment of the sacrum and coccyx. Moderate L5-S1 disc space narrowing. Surgical clips overlie the lower pelvis. IMPRESSION: 1. No acute fracture. 2. Moderate degenerative changes of the superior aspect of the bilateral sacroiliac joints, similar to prior CT. 3. Status post bilateral total hip arthroplasty. Electronically Signed   By: Yvonne Kendall M.D.   On: 09/10/2021 17:23    DISCHARGE EXAMINATION: Vitals:   10/04/21 1711 10/04/21 2116 10/05/21 0639 10/05/21 0902  BP: 124/83 126/68 114/66 131/78  Pulse: 96 92 75 86  Resp: '20 18 18 17  '$ Temp: (!) 97.3 F (36.3 C) 98.3 F (36.8 C) 97.9 F (36.6 C) 97.7 F (36.5 C)  TempSrc:    Oral  SpO2: 97% 92% 94% 98%  Weight:      Height:       General appearance: Awake alert.  In no distress Resp: Clear to auscultation bilaterally.  Normal effort Cardio: S1-S2 is normal regular.  No S3-S4.  No rubs murmurs or bruit GI: Abdomen is soft.  Nontender  nondistended.  Bowel sounds are present normal.  No masses organomegaly    DISPOSITION: Home  Discharge Instructions     Call MD for:  difficulty breathing, headache or visual disturbances   Complete by: As directed    Call MD for:  extreme fatigue   Complete by: As directed    Call MD for:  persistant dizziness or light-headedness   Complete by: As directed    Call MD for:  persistant nausea and vomiting   Complete by: As directed    Call MD for:  severe uncontrolled pain   Complete by: As directed    Call MD for:  temperature >100.4   Complete by: As directed    Discharge instructions   Complete by: As directed    Please take your medications as prescribed.  Seek attention if you start vomiting blood again.  Eat a soft bland diet for now.  You were cared for by a hospitalist during your hospital stay. If you have any questions about your discharge medications or the care you received while you were in the hospital after you are discharged, you can call the unit and asked to speak with the hospitalist on call if the hospitalist that took care of you is not available. Once you are discharged, your primary care physician will handle any further medical issues. Please note that NO REFILLS for any discharge medications will be authorized once you are discharged, as it is imperative that you return to your primary care physician (or establish a relationship with a primary care physician if you do not have one) for your aftercare needs so that they can reassess your need for medications and monitor your lab values. If you do not have a primary care physician, you can call [phone] for a physician referral.   Increase activity slowly   Complete by: As directed           Allergies as of 10/05/2021       Reactions   Gadavist [gadobutrol] Hives   Prednisone    Agitation and hallucinations   Betadine [povidone Iodine] Other (See Comments)   Blisters    Lortab [hydrocodone-acetaminophen]  Nausea And Vomiting   Penicillins Other (See Comments)   Lightheadedness  Has patient had a PCN reaction causing immediate rash, facial/tongue/throat swelling, SOB or lightheadedness with hypotension: Yes Has patient had a PCN reaction causing severe rash involving mucus membranes or skin necrosis: No Has patient had a PCN reaction that required hospitalization No Has patient had a PCN reaction occurring within the last 10 years: No If all of the above answers are "NO", then may proceed with Cephalosporin use.   Zetia [ezetimibe] Other (See Comments)   Muscle weakness    Zocor [simvastatin] Nausea Only, Other (See Comments)   Muscle weakness         Medication List     STOP taking these medications    Breztri Aerosphere 160-9-4.8 MCG/ACT Aero Generic drug: Budeson-Glycopyrrol-Formoterol  TAKE these medications    albuterol 108 (90 Base) MCG/ACT inhaler Commonly known as: VENTOLIN HFA Inhale 2 puffs into the lungs every 6 (six) hours as needed for wheezing or shortness of breath.   amoxicillin-clavulanate 875-125 MG tablet Commonly known as: AUGMENTIN Take 1 tablet by mouth every 12 (twelve) hours for 3 days.   aspirin EC 81 MG tablet Take 1 tablet (81 mg total) by mouth daily. Swallow whole. What changed: when to take this   budesonide-formoterol 160-4.5 MCG/ACT inhaler Commonly known as: Symbicort Inhale 2 puffs into the lungs in the morning and at bedtime.   cabergoline 0.5 MG tablet Commonly known as: DOSTINEX Take 0.5 tablets (0.25 mg total) by mouth 2 (two) times a week.   cholecalciferol 1000 units tablet Commonly known as: VITAMIN D Take 1,000 Units by mouth daily.   cyanocobalamin 1000 MCG tablet Commonly known as: VITAMIN B12 Take 1,000 mcg by mouth daily.   diclofenac Sodium 1 % Gel Commonly known as: VOLTAREN Apply 2 g topically 4 (four) times daily.   diphenhydramine-acetaminophen 25-500 MG Tabs tablet Commonly known as: TYLENOL PM Take  1/2 tablet by mouth at bedtime as needed for sleep   FeroSul 325 (65 FE) MG tablet Generic drug: ferrous sulfate TAKE (1) TABLET DAILY WITH BREAKFAST. What changed: See the new instructions.   Flutter Devi Twice a day and prn as needed, may increase if feeling worse   GaviLAX 17 GM/SCOOP powder Generic drug: polyethylene glycol powder DISSOLVE 17 GM SCOOP IN 8 OZ. OF WATER AND DRINK ONCE DAILY AS NEEDED FOR CONSTIPATION What changed: See the new instructions.   guaiFENesin 600 MG 12 hr tablet Commonly known as: Mucinex Take 1 tablet (600 mg total) by mouth 2 (two) times daily. What changed: when to take this   levothyroxine 75 MCG tablet Commonly known as: SYNTHROID Take 1 tablet daily. What changed: when to take this   magnesium oxide 400 (241.3 Mg) MG tablet Commonly known as: MAG-OX Take 1 tablet (400 mg total) by mouth 2 (two) times daily. What changed:  how much to take when to take this reasons to take this   Metamucil 28 % packet Generic drug: psyllium Take 1 packet by mouth daily. What changed:  when to take this reasons to take this   naphazoline-pheniramine 0.025-0.3 % ophthalmic solution Commonly known as: NAPHCON-A Place 1 drop into the left eye 2 (two) times daily as needed for irritation or allergies.   nitroGLYCERIN 0.4 MG SL tablet Commonly known as: NITROSTAT Place 1 tablet (0.4 mg total) under the tongue every 5 (five) minutes as needed for chest pain.   pantoprazole 40 MG tablet Commonly known as: PROTONIX TAKE ONE TABLET TWICE DAILY What changed:  when to take this additional instructions   sodium chloride HYPERTONIC 3 % nebulizer solution Take by nebulization as needed for other. What changed:  how much to take when to take this   sucralfate 1 g tablet Commonly known as: CARAFATE Take 1 tablet (1 g total) by mouth 4 (four) times daily -  with meals and at bedtime. Take 1 tablet three times daily as needed What changed:  how much to  take how to take this when to take this   tamsulosin 0.4 MG Caps capsule Commonly known as: FLOMAX Take 1 capsule (0.4 mg total) by mouth daily after supper.   traMADol 50 MG tablet Commonly known as: ULTRAM Take 1 tablet (50 mg total) by mouth 2 (two) times daily as needed.  What changed: reasons to take this   triamcinolone cream 0.1 % Commonly known as: KENALOG Apply 1 application topically 2 (two) times daily. What changed:  when to take this reasons to take this   Vitamin C 500 MG Caps Take 1 capsule by mouth daily.   vitamin E 180 MG (400 UNITS) capsule Take 400 Units by mouth daily.          Follow-up Information     Dettinger, [name], MD. Schedule an appointment as soon as possible for a visit in 1 week(s).   Specialties: Family Medicine, Cardiology Why: post hospitalization follow up Contact information: [place] [address] [phone]         Care, [facility] Follow up.   Specialty: Home Health Services Why: to provide home physical therapy visits Contact information: [address] [place]  [address] [phone]         [facility] [place] .                  TOTAL DISCHARGE TIME: 35 minutes  Anjannette Gauger [employer] on [email or web address]  10/05/2021, 12:19 PM

## 2021-10-05 NOTE — TOC Transition Note (Signed)
Transition of Care [facility]) - CM/SW Discharge Note   Patient Details  Name: [name] MRN: [id number] Date of Birth: 15-Oct-1932  Transition of Care [facility]) CM/SW Contact:  Lennart Pall, LCSW Phone Number: 10/05/2021, 10:35 AM   Clinical Narrative:    Met with pt and son today to review dc needs.  Pt notes he has all needed DME at home.  Pt agreeable with HHPT recommendation and no agency preference.  Referral placed with [facility].  No further TOC needs.   Final next level of care: Home w Home Health Services Barriers to Discharge: No Barriers Identified   Patient Goals and CMS Choice Patient states their goals for this hospitalization and ongoing recovery are:: return home      Discharge Placement                       Discharge Plan and Services                DME Arranged: N/A DME Agency: NA       HH Arranged: PT HH Agency: [place] Date [facility] Agency Contacted: 10/05/21 Time [place]: 1034 Representative spoke with at [place]: Cindie  Social Determinants of Health ([place]) Interventions     Readmission Risk Interventions    10/05/2021   10:34 AM  Readmission Risk Prevention Plan  Post Dischage Appt Complete  Medication Screening Complete  Transportation Screening Complete

## 2021-10-05 NOTE — Progress Notes (Signed)
Occupational Therapy Treatment Patient Details Name: [name] MRN: [id number] DOB: Sep 18, 1932 Today's Date: 10/05/2021   History of present illness 88 y.o. male with medical history significant of GERD Berretts esophagus, pituitary microadenoma who presented with hemoptysis versus hematemesis. PMH: CAD, neuropathy, COPD. Bil THA,.   OT comments  At time of writing this, pt has discharged from the hospital. Pt overall supervision with his ADLs and functional mobility with use of a RW. Pt able to express a HEP for home using his equipment, and pt's son present and agreeable to providing supervision as needed and moving things to main floor of pt's house to minimize pt's need to use stairs.  Pt expressed looking forward to return home today.    Recommendations for follow up therapy are one component of a multi-disciplinary discharge planning process, led by the attending physician.  Recommendations may be updated based on patient status, additional functional criteria and insurance authorization.    Follow Up Recommendations  No OT follow up    Assistance Recommended at Discharge PRN  Patient can return home with the following  A little help with bathing/dressing/bathroom   Equipment Recommendations  None recommended by OT    Recommendations for Other Services      Precautions / Restrictions Precautions Precautions: Fall Precaution Comments: reports several falls Restrictions Weight Bearing Restrictions: No       Mobility Bed Mobility               General bed mobility comments: pt in recliner    Transfers                         Balance Overall balance assessment: Needs assistance, History of Falls Sitting-balance support: Feet supported, No upper extremity supported Sitting balance-Leahy Scale: Good     Standing balance support: During functional activity, Bilateral upper extremity supported Standing balance-Leahy Scale: Fair                              ADL either performed or assessed with clinical judgement   ADL Overall ADL's : Needs assistance/impaired     Grooming: Standing;Oral care;Wash/dry face;Wash/dry hands;Supervision/safety Grooming Details (indicate cue type and reason): Cues for positioning RW anterior to sink for safety.                 Toilet Transfer: Supervision/safety;Rolling walker (2 wheels);Ambulation   Toileting- Clothing Manipulation and Hygiene: Supervision/safety       Functional mobility during ADLs: Supervision/safety;Rolling walker (2 wheels)      Extremity/Trunk Assessment Upper Extremity Assessment Upper Extremity Assessment: Overall WFL for tasks assessed            Vision Baseline Vision/History: 1 Wears glasses Vision Assessment?: No apparent visual deficits   Perception     Praxis      Cognition Arousal/Alertness: Awake/alert Behavior During Therapy: WFL for tasks assessed/performed Overall Cognitive Status: Within Functional Limits for tasks assessed                                          Exercises Other Exercises Other Exercises: Pt talking about exercise plans for home including his tredmill, stationary bike and bowflex system. Pt cautioned against tredmill when still using RW due to high fall risk. Pt's son in room and both receptive to this. Son reported  plan to bring stationary bike from baseline to main floor for pt.    Shoulder Instructions       General Comments      Pertinent Vitals/ Pain       Pain Assessment Pain Assessment: No/denies pain  Home Living                                          Prior Functioning/Environment              Frequency  Min 1X/week        Progress Toward Goals  OT Goals(current goals can now be found in the care plan section)  Progress towards OT goals: Progressing toward goals  Acute Rehab OT Goals Patient Stated Goal: Go home today OT Goal Formulation:  All assessment and education complete, DC therapy  Plan Discharge plan remains appropriate    Co-evaluation                 AM-PAC OT "6 Clicks" Daily Activity     Outcome Measure   Help from another person eating meals?: None Help from another person taking care of personal grooming?: None Help from another person toileting, which includes using toliet, bedpan, or urinal?: A Little Help from another person bathing (including washing, rinsing, drying)?: None Help from another person to put on and taking off regular upper body clothing?: None Help from another person to put on and taking off regular lower body clothing?: A Little 6 Click Score: 22    End of Session Equipment Utilized During Treatment: Gait belt;Rolling walker (2 wheels)  OT Visit Diagnosis: Muscle weakness (generalized) (M62.81)   Activity Tolerance Patient tolerated treatment well   Patient Left in chair;with call bell/phone within reach;with family/visitor present   Nurse Communication Mobility status        Time: 5697-9480 OT Time Calculation (min): 24 min  Charges: OT General Charges $OT Visit: 1 Visit OT Treatments $Self Care/Home Management : 8-22 mins $Therapeutic Activity: 8-22 mins  Anderson Malta, OT Acute Rehab Services Office: [phone] 10/05/2021  [name] 10/05/2021, 12:45 PM

## 2021-10-07 ENCOUNTER — Telehealth: Payer: Self-pay

## 2021-10-07 NOTE — Telephone Encounter (Signed)
Transition Care Management Follow-up Telephone Call Date of discharge and from where: 10/05/21 - [facility]- upper GI bleed/ dysphagia How have you been since you were released from the hospital? He feels like he is slowly getting better - still uncomfortable to swallow, seems better if he has boost or something thick to swallow rather than thin liquids like water Any questions or concerns? No  Items Reviewed: Did the pt receive and understand the discharge instructions provided? Yes  Medications obtained and verified? Yes  Other? No  Any new allergies since your discharge? No  Dietary orders reviewed? Yes Do you have support at home? Yes   Home Care and Equipment/Supplies: Were home health services ordered? yes If so, what is the name of the agency? [facility]  Has the agency set up a time to come to the patient's home? yes Were any new equipment or medical supplies ordered?  No   Functional Questionnaire: (I = Independent and D = Dependent) ADLs: I  Bathing/Dressing- I  Meal Prep- D  Eating- I  Maintaining continence- I  Transferring/Ambulation- I  Managing Meds- I  Follow up appointments reviewed:  PCP Hospital f/u appt confirmed? Yes  Scheduled to see Dettinger/ DOD on 10/18/21 @ 8:35. [facility] f/u appt confirmed? Yes  Scheduled to see GI on 11/03/21 @ 11; also has appt with Delton Coombes 10/15/21 @ 11:30 - not sure if he is supposed to keep this one - he will call and ask. Are transportation arrangements needed? No  If their condition worsens, is the pt aware to call PCP or go to the Emergency Dept.? Yes Was the patient provided with contact information for the PCP's office or ED? Yes Was to pt encouraged to call back with questions or concerns? Yes

## 2021-10-10 ENCOUNTER — Encounter: Payer: Self-pay | Admitting: Internal Medicine

## 2021-10-15 ENCOUNTER — Encounter: Payer: Medicare Other | Admitting: Hematology

## 2021-10-18 ENCOUNTER — Encounter: Payer: Self-pay | Admitting: Family Medicine

## 2021-10-18 ENCOUNTER — Ambulatory Visit (HOSPITAL_COMMUNITY): Payer: Medicare Other

## 2021-10-18 ENCOUNTER — Ambulatory Visit (INDEPENDENT_AMBULATORY_CARE_PROVIDER_SITE_OTHER): Payer: Medicare Other | Admitting: Family Medicine

## 2021-10-18 VITALS — BP 137/82 | HR 92 | Temp 97.5°F | Ht 67.0 in | Wt 155.0 lb

## 2021-10-18 DIAGNOSIS — K2211 Ulcer of esophagus with bleeding: Secondary | ICD-10-CM | POA: Diagnosis not present

## 2021-10-18 DIAGNOSIS — D62 Acute posthemorrhagic anemia: Secondary | ICD-10-CM

## 2021-10-18 DIAGNOSIS — K922 Gastrointestinal hemorrhage, unspecified: Secondary | ICD-10-CM | POA: Diagnosis not present

## 2021-10-18 DIAGNOSIS — K2270 Barrett's esophagus without dysplasia: Secondary | ICD-10-CM

## 2021-10-18 LAB — CBC WITH DIFFERENTIAL/PLATELET
Basophils Absolute: 0.2 10*3/uL (ref 0.0–0.2)
Basos: 2 %
EOS (ABSOLUTE): 0.3 10*3/uL (ref 0.0–0.4)
Eos: 3 %
Hematocrit: 31.4 % — ABNORMAL LOW (ref 37.5–51.0)
Hemoglobin: 10.2 g/dL — ABNORMAL LOW (ref 13.0–17.7)
Immature Grans (Abs): 0.5 10*3/uL — ABNORMAL HIGH (ref 0.0–0.1)
Immature Granulocytes: 5 %
Lymphocytes Absolute: 1 10*3/uL (ref 0.7–3.1)
Lymphs: 11 %
MCH: 29.3 pg (ref 26.6–33.0)
MCHC: 32.5 g/dL (ref 31.5–35.7)
MCV: 90 fL (ref 79–97)
Monocytes Absolute: 1 10*3/uL — ABNORMAL HIGH (ref 0.1–0.9)
Monocytes: 12 %
Neutrophils Absolute: 5.9 10*3/uL (ref 1.4–7.0)
Neutrophils: 67 %
Platelets: 396 10*3/uL (ref 150–450)
RBC: 3.48 x10E6/uL — ABNORMAL LOW (ref 4.14–5.80)
RDW: 15 % (ref 11.6–15.4)
WBC: 8.9 10*3/uL (ref 3.4–10.8)

## 2021-10-18 LAB — CMP14+EGFR
ALT: 12 IU/L (ref 0–44)
AST: 16 IU/L (ref 0–40)
Albumin/Globulin Ratio: 1.5 (ref 1.2–2.2)
Albumin: 3.7 g/dL (ref 3.7–4.7)
Alkaline Phosphatase: 76 IU/L (ref 44–121)
BUN/Creatinine Ratio: 17 (ref 10–24)
BUN: 18 mg/dL (ref 8–27)
Bilirubin Total: 0.4 mg/dL (ref 0.0–1.2)
CO2: 21 mmol/L (ref 20–29)
Calcium: 9.1 mg/dL (ref 8.6–10.2)
Chloride: 107 mmol/L — ABNORMAL HIGH (ref 96–106)
Creatinine, Ser: 1.08 mg/dL (ref 0.76–1.27)
Globulin, Total: 2.5 g/dL (ref 1.5–4.5)
Glucose: 79 mg/dL (ref 70–99)
Potassium: 4.7 mmol/L (ref 3.5–5.2)
Sodium: 139 mmol/L (ref 134–144)
Total Protein: 6.2 g/dL (ref 6.0–8.5)
eGFR: 66 mL/min/{1.73_m2} (ref >59)

## 2021-10-18 NOTE — Progress Notes (Signed)
BP 137/82   Pulse 92   Temp (!) 97.5 F (36.4 C)   Ht _0  (1.702 m)   Wt 155 lb (70.3 kg)   SpO2 96%   BMI 24.28 kg/m    Subjective:   Patient ID: [name], male    DOB: August 31, 1932, 88 y.o.   MRN: [id number]  HPI: KARLIN HEILMAN is a 87 y.o. male presenting on 10/18/2021 for Medical Management of Chronic Issues (TCM- GI Bleed/ [facility])   HPI Transition Care Management Follow-up Telephone Call Date of discharge and from where: 10/05/21 - [facility]- upper GI bleed/ dysphagia How have you been since you were released from the hospital? He feels like he is slowly getting better - still uncomfortable to swallow, seems better if he has boost or something thick to swallow rather than thin liquids like water Any questions or concerns? No Contacted by Adalberto Cole, LPN on 6/70/1410   Transition of care office visit Patient comes in today for transitional care office visit.  Patient was admitted to the hospital with an upper GI bleed on 09/30/2021 and discharged on 10/05/2021.  Patient was diagnosed with GERD with Barrett's esophagus and GI bleed during the hospital.  When he was initially admitted his hemoglobin was 12 and it dropped down as low as 8.7 and has come back up to the upper 9 range and has stabilized there.  He has not had any blood in his stool or coughed up any blood since the hospital.  He still feels kind of weak and low energy but it is gradually improving.  He still has some shortness of breath on exertion that he has been feeling and cannot do as much as he would like to since get in and out of the hospital.  He is gradually starting to feel better.  Relevant past medical, surgical, family and social history reviewed and updated as indicated. Interim medical history since our last visit reviewed. Allergies and medications reviewed and updated.  Review of Systems  Constitutional:  Positive for fatigue. Negative for chills and fever.  Eyes:  Negative for visual  disturbance.  Respiratory:  Positive for shortness of breath (On exertion). Negative for wheezing.   Cardiovascular:  Negative for chest pain and leg swelling.  Musculoskeletal:  Negative for back pain and gait problem.  Skin:  Negative for rash.  Neurological:  Positive for weakness. Negative for dizziness and light-headedness.  All other systems reviewed and are negative.   Per HPI unless specifically indicated above   Allergies as of 10/18/2021       Reactions   Gadavist [gadobutrol] Hives   Prednisone    Agitation and hallucinations   Betadine [povidone Iodine] Other (See Comments)   Blisters    Lortab [hydrocodone-acetaminophen] Nausea And Vomiting   Penicillins Other (See Comments)   Lightheadedness  Has patient had a PCN reaction causing immediate rash, facial/tongue/throat swelling, SOB or lightheadedness with hypotension: Yes Has patient had a PCN reaction causing severe rash involving mucus membranes or skin necrosis: No Has patient had a PCN reaction that required hospitalization No Has patient had a PCN reaction occurring within the last 10 years: No If all of the above answers are "NO", then may proceed with Cephalosporin use.   Zetia [ezetimibe] Other (See Comments)   Muscle weakness    Zocor [simvastatin] Nausea Only, Other (See Comments)   Muscle weakness         Medication List  Accurate as of October 18, 2021  9:06 AM. If you have any questions, ask your nurse or doctor.          albuterol 108 (90 Base) MCG/ACT inhaler Commonly known as: VENTOLIN HFA Inhale 2 puffs into the lungs every 6 (six) hours as needed for wheezing or shortness of breath.   aspirin EC 81 MG tablet Take 1 tablet (81 mg total) by mouth daily. Swallow whole. What changed: when to take this   budesonide-formoterol 160-4.5 MCG/ACT inhaler Commonly known as: Symbicort Inhale 2 puffs into the lungs in the morning and at bedtime.   cabergoline 0.5 MG tablet Commonly  known as: DOSTINEX Take 0.5 tablets (0.25 mg total) by mouth 2 (two) times a week.   cholecalciferol 1000 units tablet Commonly known as: VITAMIN D Take 1,000 Units by mouth daily.   cyanocobalamin 1000 MCG tablet Commonly known as: VITAMIN B12 Take 1,000 mcg by mouth daily.   diclofenac Sodium 1 % Gel Commonly known as: VOLTAREN Apply 2 g topically 4 (four) times daily.   diphenhydramine-acetaminophen 25-500 MG Tabs tablet Commonly known as: TYLENOL PM Take 1/2 tablet by mouth at bedtime as needed for sleep   FeroSul 325 (65 FE) MG tablet Generic drug: ferrous sulfate TAKE (1) TABLET DAILY WITH BREAKFAST. What changed: See the new instructions.   Flutter Devi Twice a day and prn as needed, may increase if feeling worse   GaviLAX 17 GM/SCOOP powder Generic drug: polyethylene glycol powder DISSOLVE 17 GM SCOOP IN 8 OZ. OF WATER AND DRINK ONCE DAILY AS NEEDED FOR CONSTIPATION What changed: See the new instructions.   guaiFENesin 600 MG 12 hr tablet Commonly known as: Mucinex Take 1 tablet (600 mg total) by mouth 2 (two) times daily.   levothyroxine 75 MCG tablet Commonly known as: SYNTHROID Take 1 tablet daily. What changed: when to take this   magnesium oxide 400 (241.3 Mg) MG tablet Commonly known as: MAG-OX Take 1 tablet (400 mg total) by mouth 2 (two) times daily. What changed:  how much to take when to take this reasons to take this   Metamucil 28 % packet Generic drug: psyllium Take 1 packet by mouth daily. What changed:  when to take this reasons to take this   naphazoline-pheniramine 0.025-0.3 % ophthalmic solution Commonly known as: NAPHCON-A Place 1 drop into the left eye 2 (two) times daily as needed for irritation or allergies.   nitroGLYCERIN 0.4 MG SL tablet Commonly known as: NITROSTAT Place 1 tablet (0.4 mg total) under the tongue every 5 (five) minutes as needed for chest pain.   pantoprazole 40 MG tablet Commonly known as:  PROTONIX TAKE ONE TABLET TWICE DAILY What changed:  when to take this additional instructions   sodium chloride HYPERTONIC 3 % nebulizer solution Take by nebulization as needed for other. What changed:  how much to take when to take this   sucralfate 1 g tablet Commonly known as: CARAFATE Take 1 tablet (1 g total) by mouth 4 (four) times daily -  with meals and at bedtime. Take 1 tablet three times daily as needed   tamsulosin 0.4 MG Caps capsule Commonly known as: FLOMAX Take 1 capsule (0.4 mg total) by mouth daily after supper.   traMADol 50 MG tablet Commonly known as: ULTRAM Take 1 tablet (50 mg total) by mouth 2 (two) times daily as needed. What changed: reasons to take this   triamcinolone cream 0.1 % Commonly known as: KENALOG Apply 1 application topically 2 (  two) times daily. What changed:  when to take this reasons to take this   Vitamin C 500 MG Caps Take 1 capsule by mouth daily.   vitamin E 180 MG (400 UNITS) capsule Take 400 Units by mouth daily.         Objective:   BP 137/82   Pulse 92   Temp (!) 97.5 F (36.4 C)   Ht _0  (1.702 m)   Wt 155 lb (70.3 kg)   SpO2 96%   BMI 24.28 kg/m   Wt Readings from Last 3 Encounters:  10/18/21 155 lb (70.3 kg)  10/01/21 155 lb 10.3 oz (70.6 kg)  09/23/21 161 lb (73 kg)    Physical Exam Vitals and nursing note reviewed.  Constitutional:      General: He is not in acute distress.    Appearance: He is well-developed. He is not diaphoretic.  Eyes:     General: No scleral icterus.    Conjunctiva/sclera: Conjunctivae normal.  Neck:     Thyroid: No thyromegaly.  Cardiovascular:     Rate and Rhythm: Normal rate and regular rhythm.     Heart sounds: Normal heart sounds. No murmur heard. Pulmonary:     Effort: Pulmonary effort is normal. No respiratory distress.     Breath sounds: Normal breath sounds. No wheezing, rhonchi or rales.  Musculoskeletal:        General: No swelling. Normal range of  motion.     Cervical back: Neck supple.  Lymphadenopathy:     Cervical: No cervical adenopathy.  Skin:    General: Skin is warm and dry.     Findings: No rash.  Neurological:     Mental Status: He is alert and oriented to person, place, and time.     Coordination: Coordination normal.  Psychiatric:        Behavior: Behavior normal.       Assessment & Plan:   Problem List Items Addressed This Visit       Digestive   Barrett's esophagus   Relevant Orders   CBC with Differential/Platelet   CMP14+EGFR   Upper GI bleed - Primary   Relevant Orders   CBC with Differential/Platelet   CMP14+EGFR   Ulcer of esophagus with bleeding   Relevant Orders   CBC with Differential/Platelet   CMP14+EGFR   Other Visit Diagnoses     Acute blood loss anemia       Relevant Orders   CBC with Differential/Platelet   CMP14+EGFR     We will check blood work and see where the blood counts are.  Follow up plan: Return if symptoms worsen or fail to improve, for 1 to 52-monthfollow-up GI bleed.  Counseling provided for all of the vaccine components Orders Placed This Encounter  Procedures   CBC with Differential/Platelet   CSienna PlantationDettinger, MD [facility] 10/18/2021, 9:06 AM

## 2021-10-20 ENCOUNTER — Ambulatory Visit (INDEPENDENT_AMBULATORY_CARE_PROVIDER_SITE_OTHER): Payer: Medicare Other

## 2021-10-20 DIAGNOSIS — E538 Deficiency of other specified B group vitamins: Secondary | ICD-10-CM

## 2021-10-20 DIAGNOSIS — I2510 Atherosclerotic heart disease of native coronary artery without angina pectoris: Secondary | ICD-10-CM

## 2021-10-20 DIAGNOSIS — K2211 Ulcer of esophagus with bleeding: Secondary | ICD-10-CM

## 2021-10-20 DIAGNOSIS — I700 Atherosclerosis of aorta: Secondary | ICD-10-CM

## 2021-10-20 DIAGNOSIS — F32A Depression, unspecified: Secondary | ICD-10-CM

## 2021-10-20 DIAGNOSIS — E039 Hypothyroidism, unspecified: Secondary | ICD-10-CM

## 2021-10-20 DIAGNOSIS — J479 Bronchiectasis, uncomplicated: Secondary | ICD-10-CM

## 2021-10-20 DIAGNOSIS — I951 Orthostatic hypotension: Secondary | ICD-10-CM

## 2021-10-20 DIAGNOSIS — E785 Hyperlipidemia, unspecified: Secondary | ICD-10-CM

## 2021-10-20 DIAGNOSIS — K222 Esophageal obstruction: Secondary | ICD-10-CM

## 2021-10-20 DIAGNOSIS — M461 Sacroiliitis, not elsewhere classified: Secondary | ICD-10-CM

## 2021-10-20 DIAGNOSIS — I712 Thoracic aortic aneurysm, without rupture, unspecified: Secondary | ICD-10-CM

## 2021-10-20 DIAGNOSIS — R1310 Dysphagia, unspecified: Secondary | ICD-10-CM

## 2021-10-20 DIAGNOSIS — I480 Paroxysmal atrial fibrillation: Secondary | ICD-10-CM

## 2021-10-20 DIAGNOSIS — N401 Enlarged prostate with lower urinary tract symptoms: Secondary | ICD-10-CM

## 2021-10-20 DIAGNOSIS — D62 Acute posthemorrhagic anemia: Secondary | ICD-10-CM

## 2021-10-20 DIAGNOSIS — I10 Essential (primary) hypertension: Secondary | ICD-10-CM

## 2021-10-20 DIAGNOSIS — K2101 Gastro-esophageal reflux disease with esophagitis, with bleeding: Secondary | ICD-10-CM | POA: Diagnosis not present

## 2021-10-20 DIAGNOSIS — J690 Pneumonitis due to inhalation of food and vomit: Secondary | ICD-10-CM

## 2021-10-20 DIAGNOSIS — D352 Benign neoplasm of pituitary gland: Secondary | ICD-10-CM | POA: Diagnosis not present

## 2021-10-20 DIAGNOSIS — J439 Emphysema, unspecified: Secondary | ICD-10-CM

## 2021-10-20 DIAGNOSIS — D630 Anemia in neoplastic disease: Secondary | ICD-10-CM

## 2021-10-20 DIAGNOSIS — F419 Anxiety disorder, unspecified: Secondary | ICD-10-CM

## 2021-10-20 DIAGNOSIS — I088 Other rheumatic multiple valve diseases: Secondary | ICD-10-CM

## 2021-10-20 DIAGNOSIS — R338 Other retention of urine: Secondary | ICD-10-CM

## 2021-10-20 DIAGNOSIS — J45909 Unspecified asthma, uncomplicated: Secondary | ICD-10-CM

## 2021-10-22 ENCOUNTER — Encounter: Payer: Self-pay | Admitting: Nurse Practitioner

## 2021-10-22 ENCOUNTER — Ambulatory Visit (INDEPENDENT_AMBULATORY_CARE_PROVIDER_SITE_OTHER): Payer: Medicare Other | Admitting: Nurse Practitioner

## 2021-10-22 ENCOUNTER — Telehealth: Payer: Self-pay | Admitting: Family Medicine

## 2021-10-22 ENCOUNTER — Encounter: Payer: Self-pay | Admitting: "Endocrinology

## 2021-10-22 VITALS — BP 123/68 | HR 78 | Temp 97.9°F | Resp 20 | Ht 67.0 in | Wt 156.0 lb

## 2021-10-22 DIAGNOSIS — M25551 Pain in right hip: Secondary | ICD-10-CM | POA: Diagnosis not present

## 2021-10-22 MED ORDER — METHYLPREDNISOLONE ACETATE 80 MG/ML IJ SUSP
80.0000 mg | Freq: Once | INTRAMUSCULAR | Status: AC
  Administered 2021-10-22: 80 mg via INTRAMUSCULAR

## 2021-10-22 MED ORDER — KETOROLAC TROMETHAMINE 60 MG/2ML IM SOLN
60.0000 mg | Freq: Once | INTRAMUSCULAR | Status: AC
  Administered 2021-10-22: 60 mg via INTRAMUSCULAR

## 2021-10-22 NOTE — Telephone Encounter (Signed)
Pt is having RT hip pain. Can he be worked in with Building control surveyor or Lajuana Ripple? Please call back

## 2021-10-22 NOTE — Telephone Encounter (Signed)
Called patient appt made with mmm today

## 2021-10-22 NOTE — Telephone Encounter (Signed)
Dr. Lajuana Ripple is off, I do not have anything today but there is a possibility you can see whoever is Dr. Berneice Gandy the day.  If not then we can put him on wait list to review to see what pops up either today or early next week.

## 2021-10-22 NOTE — Progress Notes (Signed)
   Subjective:    Patient ID: [name], male    DOB: [birth year], 88 y.o.   MRN: [id number]   Chief Complaint: Hip Pain (Right hip/)   Patient comes in today c/o of right hip pain. He is aware that we do not have xray today.  Hip Pain  There was no injury mechanism. The pain is present in the right hip. The quality of the pain is described as stabbing and aching. The pain is at a severity of 7/10. The pain is severe. The pain has been Fluctuating since onset. Associated symptoms include an inability to bear weight. Pertinent negatives include no loss of motion. He reports no foreign bodies present. The symptoms are aggravated by movement and weight bearing. He has tried acetaminophen for the symptoms. The treatment provided no relief.       Review of Systems  Musculoskeletal:  Positive for arthralgias (right hip pain).  All other systems reviewed and are negative.      Objective:   Physical Exam Constitutional:      Appearance: Normal appearance.  Cardiovascular:     Rate and Rhythm: Normal rate and regular rhythm.     Heart sounds: Normal heart sounds.  Pulmonary:     Breath sounds: Normal breath sounds.  Musculoskeletal:     Comments: Gait slow and steady with cane.  FROM of right hip with pain on weight  bearing and external roatation.  Skin:    General: Skin is warm.  Neurological:     General: No focal deficit present.     Mental Status: He is alert and oriented to person, place, and time.  Psychiatric:        Behavior: Behavior normal.    BP 123/68   Pulse 78   Temp 97.9 F (36.6 C) (Temporal)   Resp 20   Ht '5\' 7"'$  (1.702 m)   Wt 156 lb (70.8 kg)   SpO2 95%   BMI 24.43 kg/m          Assessment & Plan:   [name] in today with chief complaint of Hip Pain (Right hip/)   1. Right hip pain Ice BID Rest RTO  prn Does not want to do physical therapy at this time - ketorolac (TORADOL) injection 60 mg - methylPREDNISolone acetate  (DEPO-MEDROL) injection 80 mg    The above assessment and management plan was discussed with the patient. The patient verbalized understanding of and has agreed to the management plan. Patient is aware to call the clinic if symptoms persist or worsen. Patient is aware when to return to the clinic for a follow-up visit. Patient educated on when it is appropriate to go to the emergency department.   Mary-Margaret Hassell Done, FNP

## 2021-10-29 ENCOUNTER — Ambulatory Visit (INDEPENDENT_AMBULATORY_CARE_PROVIDER_SITE_OTHER): Payer: Medicare Other | Admitting: Family Medicine

## 2021-10-29 ENCOUNTER — Encounter: Payer: Self-pay | Admitting: Family Medicine

## 2021-10-29 VITALS — BP 98/53 | HR 86 | Temp 98.0°F | Ht 67.0 in | Wt 156.2 lb

## 2021-10-29 DIAGNOSIS — M25512 Pain in left shoulder: Secondary | ICD-10-CM

## 2021-10-29 DIAGNOSIS — G8929 Other chronic pain: Secondary | ICD-10-CM | POA: Diagnosis not present

## 2021-10-29 NOTE — Progress Notes (Signed)
   Acute Office Visit  Subjective:     Patient ID: [name], male    DOB: 1932-09-02, 88 y.o.   MRN: [id number]  Chief Complaint  Patient presents with   Shoulder Pain    Shoulder Pain  The pain is present in the left shoulder. This is a chronic problem. There has been no history of extremity trauma. The problem occurs daily. The problem has been unchanged. The quality of the pain is described as aching. The pain is moderate. Associated symptoms include a limited range of motion and stiffness. Pertinent negatives include no fever, inability to bear weight, itching, joint locking, joint swelling, numbness or tingling. The symptoms are aggravated by activity and lying down. He has tried rest, oral narcotics, movement and OTC ointments for the symptoms. The treatment provided mild relief. His past medical history is significant for osteoarthritis.     Review of Systems  Constitutional:  Negative for chills and fever.  Musculoskeletal:  Positive for stiffness. Negative for falls.  Skin:  Negative for itching.  Neurological:  Negative for tingling, focal weakness, weakness and numbness.        Objective:    BP (!) 98/53   Pulse 86   Temp 98 F (36.7 C) (Temporal)   Ht '5\' 7"'$  (1.702 m)   Wt 156 lb 4 oz (70.9 kg)   SpO2 97%   BMI 24.47 kg/m    Physical Exam Vitals and nursing note reviewed.  Constitutional:      General: He is not in acute distress.    Appearance: He is not ill-appearing, toxic-appearing or diaphoretic.  Cardiovascular:     Rate and Rhythm: Normal rate and regular rhythm.     Heart sounds: Normal heart sounds. No murmur heard. Pulmonary:     Effort: Pulmonary effort is normal. No respiratory distress.     Breath sounds: Normal breath sounds.  Musculoskeletal:     Left shoulder: Tenderness and crepitus present. No swelling, deformity, effusion, laceration or bony tenderness. Decreased range of motion.  Skin:    General: Skin is warm and dry.   Neurological:     Mental Status: He is alert and oriented to person, place, and time.     Gait: Gait abnormal (using cane).  Psychiatric:        Mood and Affect: Mood normal.        Behavior: Behavior normal.     No results found for any visits on 10/29/21.      Assessment & Plan:   [name] was seen today for shoulder pain.  Diagnoses and all orders for this visit:  Chronic left shoulder pain Chronic. He had a steroid and Toradol injection for his hip just last week. Disucssed that he can take tylenol for pain. He has tramadol on hand if needed. Rest, ice, heat, stretching, OTC ointments. He has been evaluated by ortho as well.   Return if symptoms worsen or fail to improve.  The patient indicates understanding of these issues and agrees with the plan.  Gwenlyn Perking, FNP

## 2021-10-29 NOTE — Patient Instructions (Signed)

## 2021-11-02 ENCOUNTER — Telehealth: Payer: Self-pay

## 2021-11-02 NOTE — Telephone Encounter (Signed)
Simona Huh from [facility] called for verbal order to continue pts PT for the next three week, two times per week.  Verbal order given. [phone]

## 2021-11-03 ENCOUNTER — Encounter: Payer: Self-pay | Admitting: Nurse Practitioner

## 2021-11-03 ENCOUNTER — Ambulatory Visit: Payer: Medicare Other | Admitting: Nurse Practitioner

## 2021-11-03 ENCOUNTER — Telehealth: Payer: Self-pay

## 2021-11-03 VITALS — BP 126/70 | HR 82 | Ht 67.0 in | Wt 153.0 lb

## 2021-11-03 DIAGNOSIS — K209 Esophagitis, unspecified without bleeding: Secondary | ICD-10-CM | POA: Diagnosis not present

## 2021-11-03 DIAGNOSIS — K222 Esophageal obstruction: Secondary | ICD-10-CM | POA: Diagnosis not present

## 2021-11-03 MED ORDER — PANTOPRAZOLE SODIUM 40 MG PO TBEC: 40.0000 mg | DELAYED_RELEASE_TABLET | ORAL | 3 refills | Status: DC

## 2021-11-03 NOTE — Patient Instructions (Addendum)
If you are age 86 or older, your body mass index should be between 23-30. Your Body mass index is 23.96 kg/m. If this is out of the aforementioned range listed, please consider follow up with your Primary Care Provider. ________________________________________________________  The [place] [facility] providers would like to encourage you to use [facility] to communicate with providers for non-urgent requests or questions.  Due to long hold times on the telephone, sending your provider a message by [facility] may be a faster and more efficient way to get a response.  Please allow 48 business hours for a response.  Please remember that this is for non-urgent requests.  _______________________________________________________  STOP Sucralfate/Carafate   Continue Pantoprazole 40 mg twice daily 30 minutes before meals. Refills have been sent to your pharmacy.  Nevin Bloodgood will be talking to Dr. Hilarie Fredrickson to decide if he feels that you need a follow up Endoscopy. You will receive a phone call to discuss this.  Follow up as needed for now.  Thank you for entrusting me with your care and choosing [facility].  Tye Savoy, NP-C

## 2021-11-03 NOTE — Progress Notes (Signed)
Chief Complaint: Hospital follow-up.    Assessment &  Plan   #88 yo male with GERD/ severe esophagitis, remote Barrett's, large hiatal hernia. Hospitalized a month ago for upper GI bleed secondary to severe esophagitis and esophageal ulcer.  Hgb recovering, up to 10.2. He didn't require a transfusion. Taking oral iron  Discussed anti-reflux measures such as avoidance of late meals / bedtime snacks, HOB elevation (or use of wedge pillow), and avoidance of caffeine.  Continue BID Pantoprazole 30 minutes before breakfast and dinner Okay to discontinue Carafate, he has been taking it for a month now An 8 week follow up EGD to document healing and possibly dilated stricture was recommended at time of last EGD a month ago. Will talk with Dr. Hilarie Fredrickson to see if he agrees with that or wants to hold off since patient is feeling well, hasn't had any further bleeding and no dysphagia other than with certain pills     # Esophageal stricture. Not dilated at time of recent EGD due to upper GI bleed. No dysphagia with the exception of difficulty swallowing some medications.    HPI   ISAIAHS CHANCY is a 88 y.o. male known to Dr.  Hilarie Fredrickson with a past medical history significant for chronic constipation, bronchiectasis, A-fib, ascending aortic aneurysm, prolactinoma, history of GERD with severe esophagitis, remote Barrett's esophagus, history of esophageal stricture status post dilatation, history of hiatal hernia repair with recurrence, colon polyps. See PMH /PSH for additional history   Patient was hospitalized early August with an upper GI bleed, dysphagia , aspiration pneumonia.  Inpatient EGD showed large clot burden and active bleeding in the esophagus.  The clot was causing occlusion of the esophagus which led to the patient's dysphagia.  He had severe esophagitis and ulceration of the esophagus which was treated with APC.  A moderate esophageal stricture was traversable, we did not dilated.  It was not  clear if the severe esophagitis was related to the acid reflux versus stasis or pill esophagitis.  During that admission his hemoglobin did drop a couple of points but eventually stabilized at 9.8.  He was treated with sucralfate suspension for 2 weeks.  He was advised to stay on twice daily PPI. The plan was to consider repeat EGD in 8 weeks to reassess the esophagitis and also consider repeat dilation.   No hematemesis, no blood in stool / black stool . He feels well. He is still taking Carafate. He is taking BID Pantoprazole.   10/02/21 EGD -The examined portions of the nasopharynx, oropharynx and larynx were normal. - Clotted blood in the lower third of the esophagus. - Severe esophagitis with bleeding. Treated with argon plasma coagulation (APC). This esophagitis may be reflux or stasis in etiology - Esophageal ulcer with stigmata of recent bleeding. - Large hiatal hernia. - Normal. - No specimens collected.  Labs:     Latest Ref Rng & Units 10/18/2021    9:22 AM 10/05/2021    2:16 AM 10/04/2021    2:36 AM  CBC  WBC 3.4 - 10.8 x10E3/uL 8.9  11.1  10.3   Hemoglobin 13.0 - 17.7 g/dL 10.2  9.8  9.6   Hematocrit 37.5 - 51.0 % 31.4  30.0  30.4   Platelets 150 - 450 x10E3/uL 396  225  195        Latest Ref Rng & Units 10/18/2021    9:22 AM 10/01/2021    4:35 AM 09/30/2021    5:40 PM  Hepatic  Function  Total Protein 6.0 - 8.5 g/dL 6.2  5.4  7.0   Albumin 3.7 - 4.7 g/dL 3.7  2.9  3.8   AST 0 - 40 IU/L $Remov'16  18  23   'kGUzaY$ ALT 0 - 44 IU/L $Remov'12  16  21   'JVFtan$ Alk Phosphatase 44 - 121 IU/L 76  55  74   Total Bilirubin 0.0 - 1.2 mg/dL 0.4  0.6  0.5      Past Medical History:  Diagnosis Date   Abnormality of gait 06/07/2013   Anemia    Aneurysm ([place])    on assending aorta, currently watching it, Dr Cyndia Bent   Anxiety    Arthritis    Asthma    Atrial fibrillation ([place])    B12 deficiency    Barrett's esophagus    CAD (coronary artery disease) 03/2006   3.0 x 20 mm TAXUS Perseus DES to the LAD;  01/2007  L main 30%, oLAD 50%, pLAD stent ok, CFX 80%, OM 60%, pRCA 60%, mRCA 70%, oPDA 90%; med rx    Cancer ([place])    skin cancer    Cataract    bilateral removal of cateracts   COLONIC POLYPS 07/17/2008   Qualifier: Diagnosis of  By: Lovette Cliche, CNA, Christy     Complication of anesthesia     no issues,but pt prefers spinal due to Pulmonary problems   COPD (chronic obstructive pulmonary disease) ([place])    oxygen  on standby in home.   Depression    Diverticulosis    Emphysema    Enlarged prostate with urinary retention    Esophageal stenosis    Gastric ulcer    GERD (gastroesophageal reflux disease)    Hiatal hernia    Hypothyroidism    Neuropathy    Osteoporosis    Pituitary macroadenoma (H[facility])    Restless legs syndrome (RLS)    Tubular adenoma of colon    UGIB (upper gastrointestinal bleed) 03/2013   EGD w/ large ulcer at GE junction    Past Surgical History:  Procedure Laterality Date   ABDOMINAL HERNIA REPAIR   2008   BIOPSY  06/13/2020   Procedure: BIOPSY;  Surgeon: Montez Morita, Quillian Quince, MD;  [facility];  Service: Gastroenterology;;  gastric   CARDIAC CATHETERIZATION  01/2007   L main 30%, oLAD 50%,  pLAD stent ok, CFX 80%, OM 60%, pRCA 60%, mRCA 70%, oPDA 90%; med rx   cataract extraction both eyes     COLONOSCOPY     COLONOSCOPY WITH PROPOFOL N/A 06/13/2020   Procedure: COLONOSCOPY WITH PROPOFOL;  Surgeon: Harvel Quale, MD;  [facility];  Service: Gastroenterology;  Laterality: N/A;   CORONARY ANGIOPLASTY     CORONARY STENT PLACEMENT  03/2006   3.0 x 20 mm TAXUS Perseus DES to the LAD   CYSTOSCOPY N/A 06/10/2015   Procedure: CYSTOSCOPY FULGRATION OF BLEEDING,  electovapor resection;  Surgeon: Irine Seal, MD;  Location: [facility];  Service: Urology;  Laterality: N/A;   CYSTOSCOPY WITH INSERTION OF UROLIFT N/A 06/01/2015   Procedure: CYSTOSCOPY WITH INSERTION OF UROLIFT x4;  Surgeon: Irine Seal, MD;  [facility];  Service: Urology;   Laterality: N/A;   ESOPHAGOGASTRODUODENOSCOPY N/A 04/20/2013   Procedure: ESOPHAGOGASTRODUODENOSCOPY (EGD);  Surgeon: Inda Castle, MD;  [facility];  Service: Endoscopy;  Laterality: N/A;   ESOPHAGOGASTRODUODENOSCOPY N/A 03/25/2016   Procedure: ESOPHAGOGASTRODUODENOSCOPY (EGD);  Surgeon: Irene Shipper, MD;  Location: [facility];  Service: Endoscopy;  Laterality: N/A;  ESOPHAGOGASTRODUODENOSCOPY (EGD) WITH PROPOFOL N/A 10/13/2015   Procedure: ESOPHAGOGASTRODUODENOSCOPY (EGD) WITH PROPOFOL;  Surgeon: Jerene Bears, MD;  [facility];  Service: Gastroenterology;  Laterality: N/A;   ESOPHAGOGASTRODUODENOSCOPY (EGD) WITH PROPOFOL N/A 06/13/2020   Procedure: ESOPHAGOGASTRODUODENOSCOPY (EGD) WITH PROPOFOL;  Surgeon: Harvel Quale, MD;  [facility];  Service: Gastroenterology;  Laterality: N/A;   ESOPHAGOGASTRODUODENOSCOPY (EGD) WITH PROPOFOL N/A 10/02/2021   Procedure: ESOPHAGOGASTRODUODENOSCOPY (EGD) WITH PROPOFOL;  Surgeon: Daryel November, MD;  [facility];  Service: Gastroenterology;  Laterality: N/A;   FOOT SURGERY  1994 left, 2002 right foot   bilateral foot reconstruciton   FOOT SURGERY     reconstruction of both feet- no retained hardware.   HERNIA REPAIR     HIATAL HERNIA REPAIR  01-04-2008   HOT HEMOSTASIS N/A 10/02/2021   Procedure: HOT HEMOSTASIS (ARGON PLASMA COAGULATION/BICAP);  Surgeon: Daryel November, MD;  Location: [facility];  Service: Gastroenterology;  Laterality: N/A;   JOINT REPLACEMENT     MELANOMA EXCISION  2019   POLYPECTOMY     POLYPECTOMY  06/13/2020   Procedure: POLYPECTOMY;  Surgeon: Montez Morita, Quillian Quince, MD;  [facility];  Service: Gastroenterology;;  ascending,    PROSTATE SURGERY     x 2   SAVORY DILATION N/A 03/25/2016   Procedure: SAVORY DILATION;  Surgeon: Irene Shipper, MD;  [facility];  Service: Endoscopy;  Laterality: N/A;   TOTAL HIP ARTHROPLASTY  07/21/2011    Procedure: TOTAL HIP ARTHROPLASTY ANTERIOR APPROACH;  Surgeon: Mauri Pole, MD;  [facility];  Service: Orthopedics;  Laterality: Left;   TOTAL HIP ARTHROPLASTY Right 09/07/2012   Procedure: RIGHT TOTAL HIP ARTHROPLASTY ANTERIOR APPROACH;  Surgeon: Mcarthur Rossetti, MD;  [facility];  Service: Orthopedics;  Laterality: Right;   TRANSURETHRAL RESECTION OF BLADDER NECK N/A 11/04/2015   Procedure: RESECTION OF BLADDER NECK;  Surgeon: Cleon Gustin, MD;  [facility];  Service: Urology;  Laterality: N/A;   TRANSURETHRAL RESECTION OF PROSTATE N/A 11/04/2015   Procedure: TRANSURETHRAL RESECTION OF THE PROSTATE (TURP); REMOVAL OF UROLIFT IMPLANTS X THREE;  Surgeon: Cleon Gustin, MD;  Location[facility];  Service: Urology;  Laterality: N/A;   UPPER GASTROINTESTINAL ENDOSCOPY  12/2013   Dr Hilarie Fredrickson, gastritis   VIDEO BRONCHOSCOPY Bilateral 02/01/2017   Procedure: VIDEO BRONCHOSCOPY WITHOUT FLUORO;  Surgeon: Tanda Rockers, MD;  [facility];  Service: Cardiopulmonary;  Laterality: Bilateral;    Current Medications, Allergies, Family History and Social History were reviewed in [employer] record.     Current Outpatient Medications  Medication Sig Dispense Refill   albuterol (VENTOLIN HFA) 108 (90 Base) MCG/ACT inhaler Inhale 2 puffs into the lungs every 6 (six) hours as needed for wheezing or shortness of breath. 8 g 5   Ascorbic Acid (VITAMIN C) 500 MG CAPS Take 1 capsule by mouth daily.     aspirin EC 81 MG tablet Take 1 tablet (81 mg total) by mouth daily. Swallow whole. (Patient taking differently: Take 81 mg by mouth every other day. Swallow whole.) 90 tablet 3   budesonide-formoterol (SYMBICORT) 160-4.5 MCG/ACT inhaler Inhale 2 puffs into the lungs in the morning and at bedtime. 10.2 g 6   cabergoline (DOSTINEX) 0.5 MG tablet Take 0.5 tablets (0.25 mg total) by mouth 2 (two) times a week. 12 tablet 1   cholecalciferol (VITAMIN D) 1000 UNITS  tablet Take 1,000 Units by mouth daily.      diclofenac  Sodium (VOLTAREN) 1 % GEL Apply 2 g topically 4 (four) times daily. 50 g 5   diphenhydramine-acetaminophen (TYLENOL PM) 25-500 MG TABS tablet Take 1/2 tablet by mouth at bedtime as needed for sleep     FEROSUL 325 (65 Fe) MG tablet TAKE (1) TABLET DAILY WITH BREAKFAST. (Patient taking differently: Take 325 mg by mouth every other day.) 90 tablet 3   GAVILAX 17 GM/SCOOP powder DISSOLVE 17 GM SCOOP IN 8 OZ. OF WATER AND DRINK ONCE DAILY AS NEEDED FOR CONSTIPATION (Patient taking differently: Take 17 g by mouth daily as needed for mild constipation.) 510 g 0   guaiFENesin (MUCINEX) 600 MG 12 hr tablet Take 1 tablet (600 mg total) by mouth 2 (two) times daily. 60 tablet 0   levothyroxine (SYNTHROID) 75 MCG tablet Take 1 tablet daily. (Patient taking differently: Take 75 mcg by mouth daily before breakfast.) 90 tablet 1   magnesium oxide (MAG-OX) 400 (241.3 Mg) MG tablet Take 1 tablet (400 mg total) by mouth 2 (two) times daily. (Patient taking differently: Take 200 mg by mouth daily as needed (for leg cramps).) 15 tablet 0   naphazoline-pheniramine (NAPHCON-A) 0.025-0.3 % ophthalmic solution Place 1 drop into the left eye 2 (two) times daily as needed for irritation or allergies.     nitroGLYCERIN (NITROSTAT) 0.4 MG SL tablet Place 1 tablet (0.4 mg total) under the tongue every 5 (five) minutes as needed for chest pain. 25 tablet 2   pantoprazole (PROTONIX) 40 MG tablet TAKE ONE TABLET TWICE DAILY (Patient taking differently: Take 40 mg by mouth See admin instructions. Take 40mg  by mouth in the morning then take 40mg  every other night) 180 tablet 0   psyllium (METAMUCIL) 28 % packet Take 1 packet by mouth daily. (Patient taking differently: Take 1 packet by mouth daily as needed (for constipation).) 30 packet 1   Respiratory Therapy Supplies (FLUTTER) DEVI Twice a day and prn as needed, may increase if feeling worse 1 each 0   sodium chloride  HYPERTONIC 3 % nebulizer solution Take by nebulization as needed for other. (Patient taking differently: Take 4 mLs by nebulization daily.) 750 mL 12   sucralfate (CARAFATE) 1 g tablet Take 1 tablet (1 g total) by mouth 4 (four) times daily -  with meals and at bedtime. Take 1 tablet three times daily as needed 120 tablet 1   tamsulosin (FLOMAX) 0.4 MG CAPS capsule Take 1 capsule (0.4 mg total) by mouth daily after supper. 90 capsule 3   traMADol (ULTRAM) 50 MG tablet Take 1 tablet (50 mg total) by mouth 2 (two) times daily as needed. (Patient taking differently: Take 50 mg by mouth 2 (two) times daily as needed for moderate pain.) 30 tablet 2   triamcinolone cream (KENALOG) 0.1 % Apply 1 application topically 2 (two) times daily. (Patient taking differently: Apply 1 application  topically 2 (two) times daily as needed (itching).) 30 g 0   vitamin B-12 (CYANOCOBALAMIN) 1000 MCG tablet Take 1,000 mcg by mouth daily.     vitamin E 180 MG (400 UNITS) capsule Take 400 Units by mouth daily.     No current facility-administered medications for this visit.    Review of Systems: No chest pain. No shortness of breath. No urinary complaints.    Physical Exam  Wt Readings from Last 3 Encounters:  10/29/21 156 lb 4 oz (70.9 kg)  10/22/21 156 lb (70.8 kg)  10/18/21 155 lb (70.3 kg)    BP 126/70   Pulse  82   Ht $R'5\' 7"'RV$  (1.702 m)   Wt 153 lb (69.4 kg)   BMI 23.96 kg/m  Constitutional:  Generally well appearing elderly male in no acute distress. Psychiatric: Pleasant. Normal mood and affect. Behavior is normal. EENT: Pupils normal.  Conjunctivae are normal. No scleral icterus. Neck supple.  Cardiovascular: Normal rate, regular rhythm. Pulmonary/chest: Effort normal and breath sounds normal. No wheezing, rales or rhonchi. Abdominal: Soft, nondistended, nontender. Bowel sounds active throughout. There are no masses palpable. No hepatomegaly. Neurological: Alert and oriented to person place and  time. Skin: Skin is warm and dry. No rashes noted.  Tye Savoy, NP  11/03/2021, 10:37 AM  Cc:  Dettinger, [name], MD

## 2021-11-03 NOTE — Telephone Encounter (Signed)
Left VM for patient's son to return call regarding FMLA paper work that is missing some pages.

## 2021-11-08 NOTE — Progress Notes (Signed)
Addendum: Reviewed and agree with assessment and management plan. I think given age, chronicity of symptoms, prior biopsies negative for dysplasia and medical comorbidities it is reasonable to defer EGD given clinical improvement. That said, EGD would be repeated in the event of subjective or objective recurrent symptoms/anemia Awesome Jared, Lajuan Lines, MD

## 2021-11-10 ENCOUNTER — Ambulatory Visit (INDEPENDENT_AMBULATORY_CARE_PROVIDER_SITE_OTHER): Payer: Medicare Other | Admitting: Family Medicine

## 2021-11-10 ENCOUNTER — Encounter: Payer: Self-pay | Admitting: Family Medicine

## 2021-11-10 VITALS — BP 129/65 | HR 71 | Temp 97.6°F | Ht 67.0 in | Wt 154.0 lb

## 2021-11-10 DIAGNOSIS — M19012 Primary osteoarthritis, left shoulder: Secondary | ICD-10-CM | POA: Diagnosis not present

## 2021-11-10 MED ORDER — METHYLPREDNISOLONE ACETATE 80 MG/ML IJ SUSP
80.0000 mg | Freq: Once | INTRAMUSCULAR | Status: AC
  Administered 2021-11-10: 80 mg via INTRAMUSCULAR

## 2021-11-10 NOTE — Progress Notes (Signed)
   BP 129/65   Pulse 71   Temp 97.6 F (36.4 C)   Ht '5\' 7"'$  (1.702 m)   Wt 154 lb (69.9 kg)   SpO2 94%   BMI 24.12 kg/m    Subjective:   Patient ID: [name], male    DOB: December 06, 1932, 88 y.o.   MRN: [id number]  HPI: [name] is a 88 y.o. male presenting on 11/10/2021 for Shoulder Pain (Left. Comes and goes. Has had injections in the past/)   HPI Left shoulder osteoarthritis Patient is coming in for left pain in the shoulder and osteoarthritis.  He has had this for years and has had shoulder injections in the past that have helped and he thinks its been close to a year since he had an injection and would like to do a left shoulder injection to help with the pain.  He does keep physically active and exercises and his left shoulder currently has prohibited him to some extent from doing that.  Relevant past medical, surgical, family and social history reviewed and updated as indicated. Interim medical history since our last visit reviewed. Allergies and medications reviewed and updated.  Review of Systems  Constitutional:  Negative for chills and fever.  Musculoskeletal:  Positive for arthralgias and myalgias.  Skin:  Negative for color change and wound.    Per HPI unless specifically indicated above       Objective:   BP 129/65   Pulse 71   Temp 97.6 F (36.4 C)   Ht '5\' 7"'$  (1.702 m)   Wt 154 lb (69.9 kg)   SpO2 94%   BMI 24.12 kg/m   Wt Readings from Last 3 Encounters:  11/10/21 154 lb (69.9 kg)  11/03/21 153 lb (69.4 kg)  10/29/21 156 lb 4 oz (70.9 kg)    Physical Exam Vitals and nursing note reviewed.  Constitutional:      Appearance: Normal appearance.  Musculoskeletal:     Left shoulder: Bony tenderness and crepitus present. No swelling, deformity or tenderness. Normal range of motion. Normal strength. Normal pulse.  Neurological:     Mental Status: He is alert.     Left subacromial injection: Consent form signed. Risk factors of bleeding  and infection discussed with patient and patient is agreeable towards injection. Patient prepped with Betadine.  Posterior approach towards injection used. Injected 80 mg of Depo-Medrol and 1 mL of 2% lidocaine. Patient tolerated procedure well and no side effects from noted. Minimal to no bleeding. Simple bandage applied after.   Assessment & Plan:   Problem List Items Addressed This Visit   None Visit Diagnoses     Primary osteoarthritis of left shoulder    -  Primary   Relevant Medications   methylPREDNISolone acetate (DEPO-MEDROL) injection 80 mg        Follow up plan: Return if symptoms worsen or fail to improve.  Counseling provided for all of the vaccine components No orders of the defined types were placed in this encounter.   Caryl Pina, MD [facility] 11/10/2021, 3:31 PM

## 2021-11-29 ENCOUNTER — Telehealth: Payer: Self-pay

## 2021-11-29 NOTE — Telephone Encounter (Signed)
Patient states that he coughed up blood for an hour after eating a hamburger patty.  States that he had esophagus surgery in March and has been eating soft food for the most part. He thinks the blood was from him eating the burger patty.  Patient states that he feels much better and is no longer coughing up blood.  Wanted to let PCP know.  Will send to PCP and covering provider.

## 2021-11-29 NOTE — Telephone Encounter (Signed)
Patient aware and verbalized understanding. °

## 2021-11-29 NOTE — Telephone Encounter (Signed)
If continues recommend to be seen in person.

## 2021-11-30 ENCOUNTER — Ambulatory Visit (INDEPENDENT_AMBULATORY_CARE_PROVIDER_SITE_OTHER): Payer: Medicare Other | Admitting: Nurse Practitioner

## 2021-11-30 ENCOUNTER — Encounter: Payer: Self-pay | Admitting: Nurse Practitioner

## 2021-11-30 VITALS — BP 122/72 | HR 91 | Ht 67.0 in | Wt 152.0 lb

## 2021-11-30 DIAGNOSIS — R042 Hemoptysis: Secondary | ICD-10-CM | POA: Diagnosis not present

## 2021-11-30 NOTE — Progress Notes (Signed)
   Acute Office Visit  Subjective:     Patient ID: [name], male    DOB: Apr 06, 1932, 88 y.o.   MRN: [id number]  Chief Complaint  Patient presents with   Hemoptysis    HPI Patient is in today for hemoptysis.  Patient reports he has been coughing up small amount of blood after his meals and blood in his stool in the past few days.  Symptoms have resolved in clinic today.  He denies fever, headache, nausea, vomiting and dizziness.  No other signs or symptoms associated with current complaint  Review of Systems  Constitutional: Negative.  Negative for chills, fever and malaise/fatigue.  HENT: Negative.    Eyes: Negative.   Respiratory:  Positive for cough and hemoptysis.   Cardiovascular: Negative.   Gastrointestinal:  Positive for nausea.  Genitourinary: Negative.   Musculoskeletal:  Negative for myalgias.  Skin:  Negative for itching and rash.  Neurological:  Negative for dizziness and headaches.  All other systems reviewed and are negative.       Objective:    BP 122/72   Pulse 91   Ht '5\' 7"'$  (1.702 m)   Wt 152 lb (68.9 kg)   SpO2 97%   BMI 23.81 kg/m  BP Readings from Last 3 Encounters:  11/30/21 122/72  11/10/21 129/65  11/03/21 126/70   Wt Readings from Last 3 Encounters:  11/30/21 152 lb (68.9 kg)  11/10/21 154 lb (69.9 kg)  11/03/21 153 lb (69.4 kg)      Physical Exam Vitals and nursing note reviewed.  Constitutional:      Appearance: Normal appearance.  HENT:     Head: Normocephalic.     Right Ear: External ear normal.     Left Ear: External ear normal.     Nose: Nose normal.  Eyes:     Conjunctiva/sclera: Conjunctivae normal.  Cardiovascular:     Rate and Rhythm: Regular rhythm.  Pulmonary:     Effort: Pulmonary effort is normal.     Breath sounds: Normal breath sounds.  Abdominal:     General: Bowel sounds are normal.  Skin:    General: Skin is dry.     Findings: No rash.  Neurological:     General: No focal deficit present.      Mental Status: He is alert and oriented to person, place, and time.  Psychiatric:        Mood and Affect: Mood normal.        Behavior: Behavior normal.     No results found for any visits on 11/30/21.      Assessment & Plan:  Patient presents with history of hemoptysis and bloody stool in the past few days with all symptoms resolved.  I provided education to patient please go to emergency room if actively bleeding patient verbalized understanding Problem List Items Addressed This Visit   None Visit Diagnoses     Cough with hemoptysis    -  Primary       No orders of the defined types were placed in this encounter.   Return if symptoms worsen or fail to improve.  Ivy Lynn, NP

## 2021-11-30 NOTE — Patient Instructions (Signed)
Hemoptysis Hemoptysis is when you cough up blood. If it is mild, you may cough up small amounts of bloody spit and mucus from your lungs. If it is very bad, you may cough up a lot of blood. If you cough up 1-2 cups (240-480 mL) of blood within 24 hours, get help right away. Common causes of mild (nonmassive) hemoptysis include: Infection in your nose, throat, or lungs. Nosebleeds. Breathing in an object that is not meant to be inside your body. Common causes of very bad (massive) hemoptysis include: Tuberculosis (TB). A tumor in the lungs or upper airway. A blood clot in the lungs. Stomach bleeding or ulcer. Taking blood thinners. Having problems with blood clotting. Sometimes, the cause is not known. Follow these instructions at home: Medicines If you were prescribed an antibiotic medicine, take it as told by your doctor. Do not stop taking it even if you start to feel better. Take over-the-counter and prescription medicines only as told by your doctor. General instructions  Do not smoke or use any products that contain nicotine or tobacco. If you need help quitting, ask your doctor. Return to your normal activities when your doctor says that it is safe. Ask your doctor if it is okay for you to exercise or go on an airplane. Keep all follow-up visits. Contact a doctor if: You have a fever over 100.68F (38C). You cough up more blood than before. The blood looks brighter or darker red. Get help right away if: You cough up fresh blood or blood clots. You cough up 1-2 cups (240-480 mL) of blood in 24 hours. You have trouble breathing. You feel like you are choking. You have chest pain. You feel dizzy or light-headed. These symptoms may be an emergency. Get help right away. Call 911. Do not wait to see if the symptoms will go away. Do not drive yourself to the hospital. Summary Hemoptysis is coughing up blood. If you cough up 1-2 cups (240-480 mL) of blood in 24 hours, get help  right away. Do not smoke or use any products that contain nicotine or tobacco. If you need help quitting, ask your doctor. This information is not intended to replace advice given to you by your health care provider. Make sure you discuss any questions you have with your health care provider. Document Revised: 09/21/2020 Document Reviewed: 09/21/2020 Elsevier Patient Education  [place].

## 2021-12-01 ENCOUNTER — Other Ambulatory Visit: Payer: Self-pay

## 2021-12-01 ENCOUNTER — Ambulatory Visit (INDEPENDENT_AMBULATORY_CARE_PROVIDER_SITE_OTHER): Payer: Medicare Other | Admitting: Family Medicine

## 2021-12-01 ENCOUNTER — Telehealth: Payer: Self-pay | Admitting: Family Medicine

## 2021-12-01 ENCOUNTER — Encounter: Payer: Self-pay | Admitting: Family Medicine

## 2021-12-01 ENCOUNTER — Other Ambulatory Visit (HOSPITAL_COMMUNITY): Payer: Self-pay | Admitting: "Endocrinology

## 2021-12-01 ENCOUNTER — Other Ambulatory Visit: Payer: Self-pay | Admitting: Family Medicine

## 2021-12-01 VITALS — BP 119/65 | HR 98 | Temp 97.4°F | Ht 67.0 in | Wt 152.0 lb

## 2021-12-01 DIAGNOSIS — E039 Hypothyroidism, unspecified: Secondary | ICD-10-CM

## 2021-12-01 DIAGNOSIS — E785 Hyperlipidemia, unspecified: Secondary | ICD-10-CM | POA: Diagnosis not present

## 2021-12-01 DIAGNOSIS — D649 Anemia, unspecified: Secondary | ICD-10-CM

## 2021-12-01 DIAGNOSIS — J449 Chronic obstructive pulmonary disease, unspecified: Secondary | ICD-10-CM | POA: Diagnosis not present

## 2021-12-01 DIAGNOSIS — K2270 Barrett's esophagus without dysplasia: Secondary | ICD-10-CM | POA: Diagnosis not present

## 2021-12-01 DIAGNOSIS — Z23 Encounter for immunization: Secondary | ICD-10-CM

## 2021-12-01 DIAGNOSIS — Z13820 Encounter for screening for osteoporosis: Secondary | ICD-10-CM

## 2021-12-01 LAB — CBC WITH DIFFERENTIAL/PLATELET
Basophils Absolute: 0.1 10*3/uL (ref 0.0–0.2)
Basos: 1 %
EOS (ABSOLUTE): 0.1 10*3/uL (ref 0.0–0.4)
Eos: 1 %
Hematocrit: 23.6 % — ABNORMAL LOW (ref 37.5–51.0)
Hemoglobin: 7.3 g/dL — CL (ref 13.0–17.7)
Immature Grans (Abs): 0.7 10*3/uL — ABNORMAL HIGH (ref 0.0–0.1)
Immature Granulocytes: 5 %
Lymphocytes Absolute: 1 10*3/uL (ref 0.7–3.1)
Lymphs: 8 %
MCH: 27.2 pg (ref 26.6–33.0)
MCHC: 30.9 g/dL — ABNORMAL LOW (ref 31.5–35.7)
MCV: 88 fL (ref 79–97)
Monocytes Absolute: 1.4 10*3/uL — ABNORMAL HIGH (ref 0.1–0.9)
Monocytes: 11 %
Neutrophils Absolute: 9.1 10*3/uL — ABNORMAL HIGH (ref 1.4–7.0)
Neutrophils: 74 %
Platelets: 240 10*3/uL (ref 150–450)
RBC: 2.68 x10E6/uL — CL (ref 4.14–5.80)
RDW: 15.5 % — ABNORMAL HIGH (ref 11.6–15.4)
WBC: 12.4 10*3/uL — ABNORMAL HIGH (ref 3.4–10.8)

## 2021-12-01 MED ORDER — LEVOTHYROXINE SODIUM 75 MCG PO TABS
75.0000 ug | ORAL_TABLET | Freq: Every day | ORAL | 1 refills | Status: AC
Start: 2021-12-01 — End: ?

## 2021-12-01 NOTE — Telephone Encounter (Signed)
Shelly called from [employer] [facility] [facility] to give critical lab result.  Hgb 7.3

## 2021-12-01 NOTE — Telephone Encounter (Signed)
Patient was informed of critical lab result earlier today and will be returning morning of 12/02/21 for repeat CBC.

## 2021-12-01 NOTE — Progress Notes (Signed)
BP 119/65   Pulse 98   Temp (!) 97.4 F (36.3 C)   Ht '5\' 7"'$  (1.702 m)   Wt 152 lb (68.9 kg)   SpO2 99%   BMI 23.81 kg/m    Subjective:   Patient ID: [name], male    DOB: 07-20-1932, 88 y.o.   MRN: [id number]  HPI: JOHNOTHAN [name] is a 88 y.o. male presenting on 12/01/2021 for Medical Management of Chronic Issues   HPI Hypothyroidism recheck Patient is coming in for thyroid recheck today as well. They deny any issues with hair changes or heat or cold problems or diarrhea or constipation. They deny any chest pain or palpitations. They are currently on levothyroxine 75 micrograms   Hyperlipidemia Patient is coming in for recheck of his hyperlipidemia. The patient is currently taking no medicine currently, has been intolerant of statins and Zetia. They deny any issues with myalgias or history of liver damage from it. They deny any focal numbness or weakness or chest pain.   COPD Patient is coming in for COPD recheck today.  He is currently on albuterol and Symbicort.  He has a mild chronic cough but denies any major coughing spells or wheezing spells.  He has 0 nighttime symptoms per week and 0 daytime symptoms per week currently.   Patient has been having episodes over the past few days where patient passed out he says for maybe an hour couple days ago and then he has been coughing and he feels like has been coughing up blood and having dark stools until yesterday, yesterday his symptoms seem to resolve and that is when he saw one of my colleagues and today he has not had any further dark stools.  He is still coughing but it is clear drainage that he usually has and has not noted any more blood.  He is not feeling lightheaded or dizzy today.  He denies any chest pain.  He says he did have dark stools last night but that was the last time that he noticed a dark stool.  Relevant past medical, surgical, family and social history reviewed and updated as indicated. Interim medical  history since our last visit reviewed. Allergies and medications reviewed and updated.  Review of Systems  Constitutional:  Negative for chills and fever.  Eyes:  Negative for visual disturbance.  Respiratory:  Positive for cough. Negative for shortness of breath and wheezing.   Cardiovascular:  Negative for chest pain and leg swelling.  Gastrointestinal:  Negative for abdominal pain, anal bleeding, blood in stool, constipation, nausea and vomiting.  Genitourinary:  Negative for hematuria.  Musculoskeletal:  Negative for back pain and gait problem.  Skin:  Negative for rash.  Neurological:  Negative for dizziness, speech difficulty, weakness and light-headedness.  All other systems reviewed and are negative.   Per HPI unless specifically indicated above   Allergies as of 12/01/2021       Reactions   Gadavist [gadobutrol] Hives   Prednisone    Agitation and hallucinations   Betadine [povidone Iodine] Other (See Comments)   Blisters    Lortab [hydrocodone-acetaminophen] Nausea And Vomiting   Penicillins Other (See Comments)   Lightheadedness  Has patient had a PCN reaction causing immediate rash, facial/tongue/throat swelling, SOB or lightheadedness with hypotension: Yes Has patient had a PCN reaction causing severe rash involving mucus membranes or skin necrosis: No Has patient had a PCN reaction that required hospitalization No Has patient had a PCN reaction occurring within the  last 10 years: No If all of the above answers are "NO", then may proceed with Cephalosporin use.   Zetia [ezetimibe] Other (See Comments)   Muscle weakness    Zocor [simvastatin] Nausea Only, Other (See Comments)   Muscle weakness         Medication List        Accurate as of December 01, 2021 10:20 AM. If you have any questions, ask your nurse or doctor.          albuterol 108 (90 Base) MCG/ACT inhaler Commonly known as: VENTOLIN HFA Inhale 2 puffs into the lungs every 6 (six) hours as  needed for wheezing or shortness of breath.   aspirin EC 81 MG tablet Take 1 tablet (81 mg total) by mouth daily. Swallow whole. What changed: when to take this   budesonide-formoterol 160-4.5 MCG/ACT inhaler Commonly known as: Symbicort Inhale 2 puffs into the lungs in the morning and at bedtime.   cabergoline 0.5 MG tablet Commonly known as: DOSTINEX Take 0.5 tablets (0.25 mg total) by mouth 2 (two) times a week.   cholecalciferol 1000 units tablet Commonly known as: VITAMIN D Take 1,000 Units by mouth daily.   cyanocobalamin 1000 MCG tablet Commonly known as: VITAMIN B12 Take 1,000 mcg by mouth daily.   diclofenac Sodium 1 % Gel Commonly known as: VOLTAREN Apply 2 g topically 4 (four) times daily.   diphenhydramine-acetaminophen 25-500 MG Tabs tablet Commonly known as: TYLENOL PM Take 1/2 tablet by mouth at bedtime as needed for sleep   FeroSul 325 (65 FE) MG tablet Generic drug: ferrous sulfate TAKE (1) TABLET DAILY WITH BREAKFAST. What changed: See the new instructions.   Flutter Devi Twice a day and prn as needed, may increase if feeling worse   GaviLAX 17 GM/SCOOP powder Generic drug: polyethylene glycol powder DISSOLVE 17 GM SCOOP IN 8 OZ. OF WATER AND DRINK ONCE DAILY AS NEEDED FOR CONSTIPATION What changed: See the new instructions.   guaiFENesin 600 MG 12 hr tablet Commonly known as: Mucinex Take 1 tablet (600 mg total) by mouth 2 (two) times daily.   levothyroxine 75 MCG tablet Commonly known as: SYNTHROID Take 1 tablet (75 mcg total) by mouth daily before breakfast.   magnesium oxide 400 (241.3 Mg) MG tablet Commonly known as: MAG-OX Take 1 tablet (400 mg total) by mouth 2 (two) times daily. What changed:  how much to take when to take this reasons to take this   Metamucil 28 % packet Generic drug: psyllium Take 1 packet by mouth daily. What changed:  when to take this reasons to take this   naphazoline-pheniramine 0.025-0.3 % ophthalmic  solution Commonly known as: NAPHCON-A Place 1 drop into the left eye 2 (two) times daily as needed for irritation or allergies.   nitroGLYCERIN 0.4 MG SL tablet Commonly known as: NITROSTAT Place 1 tablet (0.4 mg total) under the tongue every 5 (five) minutes as needed for chest pain.   pantoprazole 40 MG tablet Commonly known as: PROTONIX Take 1 tablet (40 mg total) by mouth See admin instructions. Take 40mg  by mouth in the morning then take 40mg  every other night   sodium chloride HYPERTONIC 3 % nebulizer solution Take by nebulization as needed for other. What changed:  how much to take when to take this   tamsulosin 0.4 MG Caps capsule Commonly known as: FLOMAX Take 1 capsule (0.4 mg total) by mouth daily after supper.   traMADol 50 MG tablet Commonly known as: ULTRAM Take 1  tablet (50 mg total) by mouth 2 (two) times daily as needed. What changed: reasons to take this   triamcinolone cream 0.1 % Commonly known as: KENALOG Apply 1 application topically 2 (two) times daily. What changed:  when to take this reasons to take this   Vitamin C 500 MG Caps Take 1 capsule by mouth daily.   vitamin E 180 MG (400 UNITS) capsule Take 400 Units by mouth daily.         Objective:   BP 119/65   Pulse 98   Temp (!) 97.4 F (36.3 C)   Ht $R'5\' 7"'Av$  (1.702 m)   Wt 152 lb (68.9 kg)   SpO2 99%   BMI 23.81 kg/m   Wt Readings from Last 3 Encounters:  12/01/21 152 lb (68.9 kg)  11/30/21 152 lb (68.9 kg)  11/10/21 154 lb (69.9 kg)    Physical Exam Vitals and nursing note reviewed.  Constitutional:      General: He is not in acute distress.    Appearance: He is well-developed. He is not diaphoretic.  Eyes:     General: No scleral icterus.    Conjunctiva/sclera: Conjunctivae normal.  Neck:     Thyroid: No thyromegaly.  Cardiovascular:     Rate and Rhythm: Normal rate and regular rhythm.     Heart sounds: Normal heart sounds. No murmur heard. Pulmonary:     Effort:  Pulmonary effort is normal. No respiratory distress.     Breath sounds: Normal breath sounds. No wheezing.  Musculoskeletal:        General: Normal range of motion.     Cervical back: Neck supple.  Lymphadenopathy:     Cervical: No cervical adenopathy.  Skin:    General: Skin is warm and dry.     Findings: No rash.  Neurological:     Mental Status: He is alert and oriented to person, place, and time.     Coordination: Coordination normal.  Psychiatric:        Behavior: Behavior normal.       Assessment & Plan:   Problem List Items Addressed This Visit       Respiratory   COPD (chronic obstructive pulmonary disease) ([place])     Digestive   Barrett's esophagus   Relevant Orders   CBC with Differential/Platelet     Endocrine   Hypothyroidism - Primary   Relevant Medications   levothyroxine (SYNTHROID) 75 MCG tablet   Other Relevant Orders   TSH     Other   Hyperlipidemia LDL goal <70   Relevant Orders   CBC with Differential/Platelet   CMP14+EGFR   Lipid panel   Other Visit Diagnoses     Anemia, unspecified type       Relevant Orders   CBC with Differential/Platelet       Because of bleeding recently, will do CBC stat, will check the rest of his blood work like we normally do as well.  Warned him if he has any further bleeding to give [place] a call Follow up plan: Return in about 3 months (around 03/03/2022), or if symptoms worsen or fail to improve, for Thyroid and cholesterol and anemia.  Counseling provided for all of the vaccine components Orders Placed This Encounter  Procedures   CBC with Differential/Platelet   CMP14+EGFR   Lipid panel   TSH    Caryl Pina, MD [facility] 12/01/2021, 10:20 AM

## 2021-12-02 ENCOUNTER — Other Ambulatory Visit: Payer: Medicare Other

## 2021-12-02 ENCOUNTER — Emergency Department (HOSPITAL_COMMUNITY): Payer: Medicare Other

## 2021-12-02 ENCOUNTER — Observation Stay (HOSPITAL_COMMUNITY)
Admission: EM | Admit: 2021-12-02 | Discharge: 2021-12-06 | Disposition: A | Discharge status: Home / Self Care | Payer: Medicare Other | Attending: Internal Medicine | Admitting: Internal Medicine

## 2021-12-02 ENCOUNTER — Telehealth: Payer: Self-pay | Admitting: Family Medicine

## 2021-12-02 ENCOUNTER — Encounter (HOSPITAL_COMMUNITY): Payer: Self-pay | Admitting: Emergency Medicine

## 2021-12-02 DIAGNOSIS — K625 Hemorrhage of anus and rectum: Secondary | ICD-10-CM | POA: Diagnosis not present

## 2021-12-02 DIAGNOSIS — K449 Diaphragmatic hernia without obstruction or gangrene: Secondary | ICD-10-CM | POA: Diagnosis not present

## 2021-12-02 DIAGNOSIS — I4891 Unspecified atrial fibrillation: Secondary | ICD-10-CM | POA: Insufficient documentation

## 2021-12-02 DIAGNOSIS — Z85828 Personal history of other malignant neoplasm of skin: Secondary | ICD-10-CM | POA: Diagnosis not present

## 2021-12-02 DIAGNOSIS — I2510 Atherosclerotic heart disease of native coronary artery without angina pectoris: Secondary | ICD-10-CM | POA: Insufficient documentation

## 2021-12-02 DIAGNOSIS — K2100 Gastro-esophageal reflux disease with esophagitis, without bleeding: Secondary | ICD-10-CM | POA: Diagnosis not present

## 2021-12-02 DIAGNOSIS — Z1211 Encounter for screening for malignant neoplasm of colon: Secondary | ICD-10-CM | POA: Diagnosis not present

## 2021-12-02 DIAGNOSIS — K921 Melena: Secondary | ICD-10-CM | POA: Diagnosis present

## 2021-12-02 DIAGNOSIS — Z955 Presence of coronary angioplasty implant and graft: Secondary | ICD-10-CM | POA: Insufficient documentation

## 2021-12-02 DIAGNOSIS — K5730 Diverticulosis of large intestine without perforation or abscess without bleeding: Secondary | ICD-10-CM | POA: Diagnosis not present

## 2021-12-02 DIAGNOSIS — J45909 Unspecified asthma, uncomplicated: Secondary | ICD-10-CM | POA: Diagnosis not present

## 2021-12-02 DIAGNOSIS — D62 Acute posthemorrhagic anemia: Secondary | ICD-10-CM | POA: Diagnosis not present

## 2021-12-02 DIAGNOSIS — Z79899 Other long term (current) drug therapy: Secondary | ICD-10-CM | POA: Insufficient documentation

## 2021-12-02 DIAGNOSIS — Z96643 Presence of artificial hip joint, bilateral: Secondary | ICD-10-CM | POA: Diagnosis not present

## 2021-12-02 DIAGNOSIS — K222 Esophageal obstruction: Secondary | ICD-10-CM | POA: Diagnosis not present

## 2021-12-02 DIAGNOSIS — K922 Gastrointestinal hemorrhage, unspecified: Secondary | ICD-10-CM | POA: Diagnosis present

## 2021-12-02 DIAGNOSIS — E039 Hypothyroidism, unspecified: Secondary | ICD-10-CM | POA: Diagnosis not present

## 2021-12-02 DIAGNOSIS — J449 Chronic obstructive pulmonary disease, unspecified: Secondary | ICD-10-CM | POA: Diagnosis not present

## 2021-12-02 DIAGNOSIS — Z86010 Personal history of colonic polyps: Secondary | ICD-10-CM | POA: Diagnosis not present

## 2021-12-02 DIAGNOSIS — D649 Anemia, unspecified: Secondary | ICD-10-CM

## 2021-12-02 DIAGNOSIS — Z7982 Long term (current) use of aspirin: Secondary | ICD-10-CM | POA: Insufficient documentation

## 2021-12-02 LAB — CBC WITH DIFFERENTIAL/PLATELET
Basophils Absolute: 0.1 10*3/uL (ref 0.0–0.2)
Basos: 1 %
EOS (ABSOLUTE): 0.3 10*3/uL (ref 0.0–0.4)
Eos: 3 %
Hematocrit: 21.8 % — ABNORMAL LOW (ref 37.5–51.0)
Hemoglobin: 6.9 g/dL — CL (ref 13.0–17.7)
Lymphocytes Absolute: 0.8 10*3/uL (ref 0.7–3.1)
Lymphs: 8 %
MCH: 27.4 pg (ref 26.6–33.0)
MCHC: 31.7 g/dL (ref 31.5–35.7)
MCV: 87 fL (ref 79–97)
Monocytes Absolute: 1.7 10*3/uL — ABNORMAL HIGH (ref 0.1–0.9)
Monocytes: 16 %
Neutrophils Absolute: 7.4 10*3/uL — ABNORMAL HIGH (ref 1.4–7.0)
Neutrophils: 72 %
Platelets: 276 10*3/uL (ref 150–450)
RBC: 2.52 x10E6/uL — CL (ref 4.14–5.80)
RDW: 17.5 % — ABNORMAL HIGH (ref 11.6–15.4)
WBC: 10.3 10*3/uL (ref 3.4–10.8)

## 2021-12-02 LAB — LIPID PANEL
Chol/HDL Ratio: 3.3 ratio (ref 0.0–5.0)
Cholesterol, Total: 144 mg/dL (ref 100–199)
HDL: 44 mg/dL (ref >39)
LDL Chol Calc (NIH): 82 mg/dL (ref 0–99)
Triglycerides: 98 mg/dL (ref 0–149)
VLDL Cholesterol Cal: 18 mg/dL (ref 5–40)

## 2021-12-02 LAB — CMP14+EGFR
ALT: 13 IU/L (ref 0–44)
AST: 17 IU/L (ref 0–40)
Albumin/Globulin Ratio: 2 (ref 1.2–2.2)
Albumin: 3.8 g/dL (ref 3.7–4.7)
Alkaline Phosphatase: 64 IU/L (ref 44–121)
BUN/Creatinine Ratio: 28 — ABNORMAL HIGH (ref 10–24)
BUN: 27 mg/dL (ref 8–27)
Bilirubin Total: 0.5 mg/dL (ref 0.0–1.2)
CO2: 19 mmol/L — ABNORMAL LOW (ref 20–29)
Calcium: 8.7 mg/dL (ref 8.6–10.2)
Chloride: 103 mmol/L (ref 96–106)
Creatinine, Ser: 0.96 mg/dL (ref 0.76–1.27)
Globulin, Total: 1.9 g/dL (ref 1.5–4.5)
Glucose: 109 mg/dL — ABNORMAL HIGH (ref 70–99)
Potassium: 4.2 mmol/L (ref 3.5–5.2)
Sodium: 137 mmol/L (ref 134–144)
Total Protein: 5.7 g/dL — ABNORMAL LOW (ref 6.0–8.5)
eGFR: 77 mL/min/{1.73_m2} (ref >59)

## 2021-12-02 LAB — COMPREHENSIVE METABOLIC PANEL
ALT: 15 U/L (ref 0–44)
AST: 22 U/L (ref 15–41)
Albumin: 3.4 g/dL — ABNORMAL LOW (ref 3.5–5.0)
Alkaline Phosphatase: 56 U/L (ref 38–126)
Anion gap: 4 — ABNORMAL LOW (ref 5–15)
BUN: 26 mg/dL — ABNORMAL HIGH (ref 8–23)
CO2: 23 mmol/L (ref 22–32)
Calcium: 8.3 mg/dL — ABNORMAL LOW (ref 8.9–10.3)
Chloride: 111 mmol/L (ref 98–111)
Creatinine, Ser: 1.11 mg/dL (ref 0.61–1.24)
GFR, Estimated: >60 mL/min (ref >60)
Glucose, Bld: 111 mg/dL — ABNORMAL HIGH (ref 70–99)
Potassium: 3.9 mmol/L (ref 3.5–5.1)
Sodium: 138 mmol/L (ref 135–145)
Total Bilirubin: 0.5 mg/dL (ref 0.3–1.2)
Total Protein: 5.9 g/dL — ABNORMAL LOW (ref 6.5–8.1)

## 2021-12-02 LAB — CBC
HCT: 22.4 % — ABNORMAL LOW (ref 39.0–52.0)
Hemoglobin: 6.9 g/dL — CL (ref 13.0–17.0)
MCH: 28.3 pg (ref 26.0–34.0)
MCHC: 30.8 g/dL (ref 30.0–36.0)
MCV: 91.8 fL (ref 80.0–100.0)
Platelets: 253 10*3/uL (ref 150–400)
RBC: 2.44 MIL/uL — ABNORMAL LOW (ref 4.22–5.81)
RDW: 17 % — ABNORMAL HIGH (ref 11.5–15.5)
WBC: 9.8 10*3/uL (ref 4.0–10.5)
nRBC: 0 % (ref 0.0–0.2)

## 2021-12-02 LAB — POC OCCULT BLOOD, ED: Fecal Occult Bld: POSITIVE — AB

## 2021-12-02 LAB — PREPARE RBC (CROSSMATCH)

## 2021-12-02 LAB — TSH: TSH: 3.94 u[IU]/mL (ref 0.450–4.500)

## 2021-12-02 MED ORDER — ALBUTEROL SULFATE (2.5 MG/3ML) 0.083% IN NEBU: 3.0000 mL | INHALATION_SOLUTION | Freq: Four times a day (QID) | RESPIRATORY_TRACT | Status: DC | PRN

## 2021-12-02 MED ORDER — ZOLPIDEM TARTRATE 5 MG PO TABS
5.0000 mg | ORAL_TABLET | Freq: Every evening | ORAL | Status: DC | PRN
  Administered 2021-12-05: 5 mg via ORAL
  Filled 2021-12-02: qty 1

## 2021-12-02 MED ORDER — ACETAMINOPHEN 650 MG RE SUPP: 650.0000 mg | Freq: Four times a day (QID) | RECTAL | Status: DC | PRN

## 2021-12-02 MED ORDER — CABERGOLINE 0.5 MG PO TABS: 0.2500 mg | ORAL_TABLET | ORAL | Status: DC

## 2021-12-02 MED ORDER — MAGNESIUM OXIDE -MG SUPPLEMENT 400 (240 MG) MG PO TABS: 200.0000 mg | ORAL_TABLET | Freq: Every day | ORAL | Status: DC | PRN

## 2021-12-02 MED ORDER — TAMSULOSIN HCL 0.4 MG PO CAPS
0.4000 mg | ORAL_CAPSULE | Freq: Every day | ORAL | Status: DC
  Administered 2021-12-03 – 2021-12-05 (×3): 0.4 mg via ORAL
  Filled 2021-12-02 (×3): qty 1

## 2021-12-02 MED ORDER — PANTOPRAZOLE 80MG IVPB - SIMPLE MED
80.0000 mg | Freq: Once | INTRAVENOUS | Status: AC
  Administered 2021-12-02: 80 mg via INTRAVENOUS
  Filled 2021-12-02: qty 80

## 2021-12-02 MED ORDER — FERROUS SULFATE 325 (65 FE) MG PO TABS
325.0000 mg | ORAL_TABLET | ORAL | Status: DC
  Administered 2021-12-06: 325 mg via ORAL
  Filled 2021-12-02 (×3): qty 1

## 2021-12-02 MED ORDER — SUCRALFATE 1 GM/10ML PO SUSP
1.0000 g | Freq: Three times a day (TID) | ORAL | Status: DC
  Administered 2021-12-03 – 2021-12-06 (×11): 1 g via ORAL
  Filled 2021-12-02 (×12): qty 10

## 2021-12-02 MED ORDER — ALBUTEROL SULFATE (2.5 MG/3ML) 0.083% IN NEBU
2.5000 mg | INHALATION_SOLUTION | Freq: Two times a day (BID) | RESPIRATORY_TRACT | Status: DC
  Administered 2021-12-03 – 2021-12-06 (×5): 2.5 mg via RESPIRATORY_TRACT
  Filled 2021-12-02 (×5): qty 3

## 2021-12-02 MED ORDER — SODIUM CHLORIDE 0.9 % IV SOLN
10.0000 mL/h | Freq: Once | INTRAVENOUS | Status: AC
  Administered 2021-12-02: 10 mL/h via INTRAVENOUS

## 2021-12-02 MED ORDER — ACETAMINOPHEN 325 MG PO TABS: 650.0000 mg | ORAL_TABLET | Freq: Four times a day (QID) | ORAL | Status: DC | PRN

## 2021-12-02 MED ORDER — HYDRALAZINE HCL 20 MG/ML IJ SOLN: 10.0000 mg | Freq: Four times a day (QID) | INTRAMUSCULAR | Status: DC | PRN

## 2021-12-02 MED ORDER — LEVOTHYROXINE SODIUM 75 MCG PO TABS
75.0000 ug | ORAL_TABLET | Freq: Every day | ORAL | Status: DC
  Administered 2021-12-03 – 2021-12-06 (×3): 75 ug via ORAL
  Filled 2021-12-02 (×3): qty 1

## 2021-12-02 MED ORDER — VITAMIN B-12 1000 MCG PO TABS
1000.0000 ug | ORAL_TABLET | Freq: Every day | ORAL | Status: DC
  Administered 2021-12-05 – 2021-12-06 (×2): 1000 ug via ORAL
  Filled 2021-12-02 (×4): qty 1

## 2021-12-02 MED ORDER — PANTOPRAZOLE INFUSION (NEW) - SIMPLE MED
8.0000 mg/h | INTRAVENOUS | Status: AC
  Administered 2021-12-02 – 2021-12-05 (×7): 8 mg/h via INTRAVENOUS
  Filled 2021-12-02: qty 80
  Filled 2021-12-02: qty 100
  Filled 2021-12-02: qty 80
  Filled 2021-12-02 (×2): qty 100
  Filled 2021-12-02: qty 80
  Filled 2021-12-02: qty 100
  Filled 2021-12-02 (×2): qty 80

## 2021-12-02 MED ORDER — ALBUTEROL SULFATE (2.5 MG/3ML) 0.083% IN NEBU
2.5000 mg | INHALATION_SOLUTION | Freq: Four times a day (QID) | RESPIRATORY_TRACT | Status: DC
  Administered 2021-12-02: 2.5 mg via RESPIRATORY_TRACT
  Filled 2021-12-02: qty 3

## 2021-12-02 MED ORDER — PANTOPRAZOLE SODIUM 40 MG IV SOLR
40.0000 mg | Freq: Two times a day (BID) | INTRAVENOUS | Status: DC
  Administered 2021-12-06: 40 mg via INTRAVENOUS
  Filled 2021-12-02 (×2): qty 10

## 2021-12-02 MED ORDER — FLUTICASONE FUROATE-VILANTEROL 200-25 MCG/ACT IN AEPB
1.0000 | INHALATION_SPRAY | Freq: Every day | RESPIRATORY_TRACT | Status: DC
  Administered 2021-12-03 – 2021-12-06 (×3): 1 via RESPIRATORY_TRACT
  Filled 2021-12-02: qty 28

## 2021-12-02 MED ORDER — GUAIFENESIN ER 600 MG PO TB12
600.0000 mg | ORAL_TABLET | Freq: Two times a day (BID) | ORAL | Status: DC
  Administered 2021-12-03 – 2021-12-06 (×5): 600 mg via ORAL
  Filled 2021-12-02 (×7): qty 1

## 2021-12-02 MED ORDER — MAGNESIUM OXIDE 400 (241.3 MG) MG PO TABS: 200.0000 mg | ORAL_TABLET | Freq: Every day | ORAL | Status: DC | PRN

## 2021-12-02 MED ORDER — MELATONIN 5 MG PO TABS: 5.0000 mg | ORAL_TABLET | Freq: Every evening | ORAL | Status: DC | PRN

## 2021-12-02 NOTE — Telephone Encounter (Signed)
Answered and result note

## 2021-12-02 NOTE — ED Triage Notes (Signed)
Patient states hemoglobin 6.9 after blood draw today. States hemoptysis and dark stools for a few days. Patient reports feeling weak. Hx of same with transfusion.

## 2021-12-02 NOTE — Telephone Encounter (Signed)
Kennyth Lose called with critical lab result.  Hgb 6.9

## 2021-12-02 NOTE — H&P (Signed)
[facility] History and Physical  [name] VHQ:[id number] DOB: [birth year] DOA: 12/02/2021 PCP: Dettinger, [name], MD  Admitted from: Home Chief Complaint: Black stool  History of Present Illness: [name] is a 88 y.o. male with PMH significant for Barrett's esophagus, gastric ulcer, upper GI bleeding from GE junction, chronic anemia, HTN, HLD, CAD/stent, A-fib, ascending aortic aneurysm, hypothyroidism, pituitary macroadenoma, anxiety/depression. Patient was sent to the ED today by his PCP because of low hemoglobin. Patient was recently hospitalized 8/3 to 8/8 for upper GI bleeding.  EGD on 8/5 showed severe esophagitis with bleeding and esophageal ulcer with symptoms of recent bleeding that was treated with APCs.   Patient states he has been compliant to Protonix and Carafate as recommended For the last several days, patient has been feeling progressive fatigue.  On Monday 10/2, patient had a small amount of hemoptysis/hematemesis.  He also had a near syncope that day. Blood work done at PCP yesterday 10/4 showed hemoglobin of 7.3 and repeat blood work today showed hemoglobin of 6.9 and hence patient was sent to ED.  In the ED, patient was afebrile, hemodynamically stable Labs showed WBC count 9.8, hemoglobin 6.9, platelet 253 FOBT positive 2 units of PRBC transfused GI called Hospital service called for inpatient admission and management  At the time of my evaluation, patient was propped up in bed.  Not in distress.  His son was at bedside.  Review of Systems:  All systems were reviewed and were negative unless otherwise mentioned in the HPI   Past medical history: Past Medical History:  Diagnosis Date   Abnormality of gait 06/07/2013   Anemia    Aneurysm ([place])    on assending aorta, currently watching it, Dr Cyndia Bent   Anxiety    Arthritis    Asthma    Atrial fibrillation ([place])    B12 deficiency    Barrett's esophagus    CAD (coronary artery disease)  03/2006   3.0 x 20 mm TAXUS Perseus DES to the LAD; 01/2007  L main 30%, oLAD 50%, pLAD stent ok, CFX 80%, OM 60%, pRCA 60%, mRCA 70%, oPDA 90%; med rx    Cancer ([place])    skin cancer    Cataract    bilateral removal of cateracts   COLONIC POLYPS 07/17/2008   Qualifier: Diagnosis of  By: Lovette Cliche, CNA, Christy     Complication of anesthesia     no issues,but pt prefers spinal due to Pulmonary problems   COPD (chronic obstructive pulmonary disease) ([place])    oxygen  on standby in home.   Depression    Diverticulosis    Emphysema    Enlarged prostate with urinary retention    Esophageal stenosis    Gastric ulcer    GERD (gastroesophageal reflux disease)    Hiatal hernia    Hypothyroidism    Neuropathy    Osteoporosis    Pituitary macroadenoma ([facility])    Restless legs syndrome (RLS)    Tubular adenoma of colon    UGIB (upper gastrointestinal bleed) 03/2013   EGD w/ large ulcer at GE junction    Past surgical history: Past Surgical History:  Procedure Laterality Date   ABDOMINAL HERNIA REPAIR   2008   BIOPSY  06/13/2020   Procedure: BIOPSY;  Surgeon: Montez Morita, Quillian Quince, MD;  [facility];  Service: Gastroenterology;;  gastric   CARDIAC CATHETERIZATION  01/2007   L main 30%, oLAD 50%,  pLAD stent ok, CFX 80%, OM 60%, pRCA 60%,  mRCA 70%, oPDA 90%; med rx   cataract extraction both eyes     COLONOSCOPY     COLONOSCOPY WITH PROPOFOL N/A 06/13/2020   Procedure: COLONOSCOPY WITH PROPOFOL;  Surgeon: Harvel Quale, MD;  [facility];  Service: Gastroenterology;  Laterality: N/A;   CORONARY ANGIOPLASTY     CORONARY STENT PLACEMENT  03/2006   3.0 x 20 mm TAXUS Perseus DES to the LAD   CYSTOSCOPY N/A 06/10/2015   Procedure: CYSTOSCOPY FULGRATION OF BLEEDING,  electovapor resection;  Surgeon: Irine Seal, MD;  Location: [facility];  Service: Urology;  Laterality: N/A;   CYSTOSCOPY WITH INSERTION OF UROLIFT N/A 06/01/2015   Procedure: CYSTOSCOPY WITH INSERTION OF  UROLIFT x4;  Surgeon: Irine Seal, MD;  [facility];  Service: Urology;  Laterality: N/A;   ESOPHAGOGASTRODUODENOSCOPY N/A 04/20/2013   Procedure: ESOPHAGOGASTRODUODENOSCOPY (EGD);  Surgeon: Inda Castle, MD;  Location: [facility];  Service: Endoscopy;  Laterality: N/A;   ESOPHAGOGASTRODUODENOSCOPY N/A 03/25/2016   Procedure: ESOPHAGOGASTRODUODENOSCOPY (EGD);  Surgeon: Irene Shipper, MD;  Location: [facility];  Service: Endoscopy;  Laterality: N/A;   ESOPHAGOGASTRODUODENOSCOPY (EGD) WITH PROPOFOL N/A 10/13/2015   Procedure: ESOPHAGOGASTRODUODENOSCOPY (EGD) WITH PROPOFOL;  Surgeon: Jerene Bears, MD;  [facility];  Service: Gastroenterology;  Laterality: N/A;   ESOPHAGOGASTRODUODENOSCOPY (EGD) WITH PROPOFOL N/A 06/13/2020   Procedure: ESOPHAGOGASTRODUODENOSCOPY (EGD) WITH PROPOFOL;  Surgeon: Harvel Quale, MD;  [facility];  Service: Gastroenterology;  Laterality: N/A;   ESOPHAGOGASTRODUODENOSCOPY (EGD) WITH PROPOFOL N/A 10/02/2021   Procedure: ESOPHAGOGASTRODUODENOSCOPY (EGD) WITH PROPOFOL;  Surgeon: Daryel November, MD;  [facility];  Service: Gastroenterology;  Laterality: N/A;   FOOT SURGERY  1994 left, 2002 right foot   bilateral foot reconstruciton   FOOT SURGERY     reconstruction of both feet- no retained hardware.   HERNIA REPAIR     HIATAL HERNIA REPAIR  01-04-2008   HOT HEMOSTASIS N/A 10/02/2021   Procedure: HOT HEMOSTASIS (ARGON PLASMA COAGULATION/BICAP);  Surgeon: Daryel November, MD;  Location: [facility];  Service: Gastroenterology;  Laterality: N/A;   JOINT REPLACEMENT     MELANOMA EXCISION  2019   POLYPECTOMY     POLYPECTOMY  06/13/2020   Procedure: POLYPECTOMY;  Surgeon: Montez Morita, Quillian Quince, MD;  [facility];  Service: Gastroenterology;;  ascending,    PROSTATE SURGERY     x 2   SAVORY DILATION N/A 03/25/2016   Procedure: SAVORY DILATION;  Surgeon: Irene Shipper, MD;  [facility];  Service:  Endoscopy;  Laterality: N/A;   TOTAL HIP ARTHROPLASTY  07/21/2011   Procedure: TOTAL HIP ARTHROPLASTY ANTERIOR APPROACH;  Surgeon: Mauri Pole, MD;  [facility];  Service: Orthopedics;  Laterality: Left;   TOTAL HIP ARTHROPLASTY Right 09/07/2012   Procedure: RIGHT TOTAL HIP ARTHROPLASTY ANTERIOR APPROACH;  Surgeon: Mcarthur Rossetti, MD;  [facility];  Service: Orthopedics;  Laterality: Right;   TRANSURETHRAL RESECTION OF BLADDER NECK N/A 11/04/2015   Procedure: RESECTION OF BLADDER NECK;  Surgeon: Cleon Gustin, MD;  [facility];  Service: Urology;  Laterality: N/A;   TRANSURETHRAL RESECTION OF PROSTATE N/A 11/04/2015   Procedure: TRANSURETHRAL RESECTION OF THE PROSTATE (TURP); REMOVAL OF UROLIFT IMPLANTS X THREE;  Surgeon: Cleon Gustin, MD;  Location[facility];  Service: Urology;  Laterality: N/A;   UPPER GASTROINTESTINAL ENDOSCOPY  12/2013   Dr Hilarie Fredrickson, gastritis   VIDEO BRONCHOSCOPY Bilateral 02/01/2017   Procedure: VIDEO BRONCHOSCOPY WITHOUT FLUORO;  Surgeon: Melvyn Novas,  Christena Deem, MD;  Location: [facility];  Service: Cardiopulmonary;  Laterality: Bilateral;    Social History:  reports that he has never smoked. He has never used smokeless tobacco. He reports that he does not drink alcohol and does not use drugs.  Allergies:  Allergies  Allergen Reactions   Gadavist [Gadobutrol] Hives   Prednisone     Agitation and hallucinations   Betadine [Povidone Iodine] Other (See Comments)    Blisters    Lortab [Hydrocodone-Acetaminophen] Nausea And Vomiting   Penicillins Other (See Comments)    Lightheadedness  Has patient had a PCN reaction causing immediate rash, facial/tongue/throat swelling, SOB or lightheadedness with hypotension: Yes Has patient had a PCN reaction causing severe rash involving mucus membranes or skin necrosis: No Has patient had a PCN reaction that required hospitalization No Has patient had a PCN reaction occurring within the last 10 years: No If  all of the above answers are "NO", then may proceed with Cephalosporin use.   Zetia [Ezetimibe] Other (See Comments)    Muscle weakness    Zocor [Simvastatin] Nausea Only and Other (See Comments)    Muscle weakness    Gadavist [gadobutrol], Prednisone, Betadine [povidone iodine], Lortab [hydrocodone-acetaminophen], Penicillins, Zetia [ezetimibe], and Zocor [simvastatin]   Family history:  Family History  Problem Relation Age of Onset   Heart disease Sister    Heart failure Father    Other Mother        phebitis related to Kazim's birth, he was 50 weeks old when she died   Colon cancer Neg Hx    Esophageal cancer Neg Hx    Rectal cancer Neg Hx    Stomach cancer Neg Hx    Colon polyps Neg Hx      Home Meds: Prior to Admission medications   Medication Sig Start Date End Date Taking? Authorizing Provider  albuterol (VENTOLIN HFA) 108 (90 Base) MCG/ACT inhaler Inhale 2 puffs into the lungs every 6 (six) hours as needed for wheezing or shortness of breath. 07/14/21   Dettinger, [name], MD  Ascorbic Acid (VITAMIN C) 500 MG CAPS Take 1 capsule by mouth daily.    [provider]  aspirin EC 81 MG tablet Take 1 tablet (81 mg total) by mouth daily. Swallow whole. Patient taking differently: Take 81 mg by mouth every other day. Swallow whole. 05/20/20   Minus Breeding, MD  budesonide-formoterol (SYMBICORT) 160-4.5 MCG/ACT inhaler Inhale 2 puffs into the lungs in the morning and at bedtime. 03/05/21   Margaretha Seeds, MD  cabergoline (DOSTINEX) 0.5 MG tablet Take 0.5 tablets (0.25 mg total) by mouth 2 (two) times a week. 09/23/21   Cassandria Anger, MD  calcium carbonate (OS-CAL - DOSED IN MG OF ELEMENTAL CALCIUM) 1250 (500 Ca) MG tablet Take 1 tablet by mouth daily with breakfast.    [provider]  cholecalciferol (VITAMIN D) 1000 UNITS tablet Take 1,000 Units by mouth daily.     [provider]  diclofenac Sodium (VOLTAREN) 1 % GEL Apply 2 g topically 4 (four)  times daily. 07/14/21   Dettinger, [name], MD  diphenhydrAMINE (BENADRYL) 25 MG tablet Take 25 mg by mouth every 6 (six) hours as needed.    [provider]  diphenhydramine-acetaminophen (TYLENOL PM) 25-500 MG TABS tablet Take 1/2 tablet by mouth at bedtime as needed for sleep    [provider]  FEROSUL 325 (65 Fe) MG tablet TAKE (1) TABLET DAILY WITH BREAKFAST. Patient taking differently: Take 325 mg by  mouth every other day. 02/10/21   Dettinger, [name], MD  GAVILAX 17 GM/SCOOP powder DISSOLVE 17 GM SCOOP IN 8 OZ. OF WATER AND DRINK ONCE DAILY AS NEEDED FOR CONSTIPATION Patient taking differently: Take 17 g by mouth daily as needed for mild constipation. 06/29/20   Pyrtle, Lajuan Lines, MD  guaiFENesin (MUCINEX) 600 MG 12 hr tablet Take 1 tablet (600 mg total) by mouth 2 (two) times daily. 10/05/21   Bonnielee Haff, MD  levothyroxine (SYNTHROID) 75 MCG tablet Take 1 tablet (75 mcg total) by mouth daily before breakfast. 12/01/21   Dettinger, [name], MD  magnesium oxide (MAG-OX) 400 (241.3 Mg) MG tablet Take 1 tablet (400 mg total) by mouth 2 (two) times daily. Patient taking differently: Take 200 mg by mouth daily as needed (for leg cramps). 06/13/20   Cristal Deer, MD  Melatonin 5 MG CAPS Take 1 capsule by mouth at bedtime.    [provider]  naphazoline-pheniramine (NAPHCON-A) 0.025-0.3 % ophthalmic solution Place 1 drop into the left eye 2 (two) times daily as needed for irritation or allergies.    [provider]  nitroGLYCERIN (NITROSTAT) 0.4 MG SL tablet Place 1 tablet (0.4 mg total) under the tongue every 5 (five) minutes as needed for chest pain. 07/14/21   Dettinger, [name], MD  pantoprazole (PROTONIX) 40 MG tablet Take 1 tablet (40 mg total) by mouth See admin instructions. Take '40mg'$  by mouth in the morning then take '40mg'$  every other night 11/03/21   [name], NP  psyllium (METAMUCIL) 28 % packet Take 1 packet by mouth daily. Patient taking  differently: Take 1 packet by mouth daily as needed (for constipation). 06/07/21   Gwenlyn Perking, FNP  Respiratory Therapy Supplies (FLUTTER) DEVI Twice a day and prn as needed, may increase if feeling worse 05/01/18   Martyn Ehrich, NP  sodium chloride HYPERTONIC 3 % nebulizer solution Take by nebulization as needed for other. Patient taking differently: Take 4 mLs by nebulization daily. 06/18/20   Margaretha Seeds, MD  sucralfate (CARAFATE) 1 GM/10ML suspension Take 1 g by mouth 4 (four) times daily -  with meals and at bedtime.    [provider]  tamsulosin (FLOMAX) 0.4 MG CAPS capsule Take 1 capsule (0.4 mg total) by mouth daily after supper. 07/14/21   Dettinger, [name], MD  traMADol (ULTRAM) 50 MG tablet Take 1 tablet (50 mg total) by mouth 2 (two) times daily as needed. Patient taking differently: Take 50 mg by mouth 2 (two) times daily as needed for moderate pain. 09/01/21   Aundra Dubin, PA-C  triamcinolone cream (KENALOG) 0.1 % Apply 1 application topically 2 (two) times daily. Patient taking differently: Apply 1 application  topically 2 (two) times daily as needed (itching). 10/31/18   Baruch Gouty, FNP  Turmeric 500 MG CAPS Take 1 capsule by mouth daily.    [provider]  vitamin B-12 (CYANOCOBALAMIN) 1000 MCG tablet Take 1,000 mcg by mouth daily.    [provider]  vitamin E 180 MG (400 UNITS) capsule Take 400 Units by mouth daily.    [provider]    Physical Exam: Vitals:   12/02/21 1550 12/02/21 1552 12/02/21 1553 12/02/21 1731  BP: (!) 158/80   128/78  Pulse: 91   89  Resp:  16  (!) 25  Temp:  98.1 F (36.7 C)  98.2 F (36.8 C)  TempSrc:  Oral  Oral  SpO2: 95%   98%  Weight:   68.9 kg   Height:   '5\' 7"'$  (1.702 m)    Wt Readings from Last 3 Encounters:  12/02/21 68.9 kg  12/01/21 68.9 kg  11/30/21 68.9 kg   Body mass index is 23.81 kg/m.  General exam: Pleasant, elderly Caucasian male.  Not in physical  distress Skin: No rashes, lesions or ulcers. HEENT: Atraumatic, normocephalic, no obvious bleeding Lungs: Clear to auscultation bilaterally CVS: Regular rate and rhythm, no murmur GI/Abd soft, mild epigastric tenderness, nondistended, bowel sound present CNS: Alert, awake, oriented x3.  Slow to respond Psychiatry: Mood appropriate Extremities: No pedal edema, no calf tenderness     Consult Orders  (From admission, onward)           Start     Ordered   12/02/21 1733  Consult to hospitalist  GI bleed  Once       Comments: GI bleed  Provider:  (Not yet assigned)  Question Answer Comment  Place call to: [facility]   Reason for Consult Admit      12/02/21 1733            Labs on Admission:   CBC: Recent Labs  Lab 12/01/21 1025 12/02/21 0948 12/02/21 1606  WBC 12.4* 10.3 9.8  NEUTROABS 9.1* 7.4*  --   HGB 7.3* 6.9* 6.9*  HCT 23.6* 21.8* 22.4*  MCV 88 87 91.8  PLT 240 276 169    Basic Metabolic Panel: Recent Labs  Lab 12/01/21 1039 12/02/21 1606  NA 137 138  K 4.2 3.9  CL 103 111  CO2 19* 23  GLUCOSE 109* 111*  BUN 27 26*  CREATININE 0.96 1.11  CALCIUM 8.7 8.3*    Liver Function Tests: Recent Labs  Lab 12/01/21 1039 12/02/21 1606  AST 17 22  ALT 13 15  ALKPHOS 64 56  BILITOT 0.5 0.5  PROT 5.7* 5.9*  ALBUMIN 3.8 3.4*   No results for input(s): "LIPASE", "AMYLASE" in the last 168 hours. No results for input(s): "AMMONIA" in the last 168 hours.  Cardiac Enzymes: No results for input(s): "CKTOTAL", "CKMB", "CKMBINDEX", "TROPONINI" in the last 168 hours.  BNP (last 3 results) No results for input(s): "BNP" in the last 8760 hours.  ProBNP (last 3 results) No results for input(s): "PROBNP" in the last 8760 hours.  CBG: No results for input(s): "GLUCAP" in the last 168 hours.  Lipase     Component Value Date/Time   LIPASE 35 09/30/2021 1740     Urinalysis    Component Value Date/Time   COLORURINE YELLOW 09/30/2021 2200    APPEARANCEUR CLEAR 09/30/2021 2200   APPEARANCEUR Clear 09/20/2017 1337   LABSPEC >1.046 (H) 09/30/2021 2200   PHURINE 5.0 09/30/2021 2200   GLUCOSEU NEGATIVE 09/30/2021 2200   HGBUR MODERATE (A) 09/30/2021 2200   BILIRUBINUR NEGATIVE 09/30/2021 2200   BILIRUBINUR Negative 09/20/2017 1337   KETONESUR 20 (A) 09/30/2021 2200   PROTEINUR NEGATIVE 09/30/2021 2200   UROBILINOGEN negative 12/31/2013 1022   UROBILINOGEN 0.2 04/18/2013 2026   NITRITE NEGATIVE 09/30/2021 2200   LEUKOCYTESUR NEGATIVE 09/30/2021 2200     Drugs of Abuse  No results found for: "LABOPIA", "COCAINSCRNUR", "LABBENZ", "AMPHETMU", "THCU", "LABBARB"    Radiological Exams on Admission: DG Chest Port 1 View  Result Date: 12/02/2021 CLINICAL DATA:  Weakness EXAM: PORTABLE CHEST 1 VIEW COMPARISON:  09/30/2021 FINDINGS: Single frontal view of the chest demonstrates a stable cardiac silhouette. Chronic elevation of the left hemidiaphragm. Stable areas of bibasilar scarring and bronchiectasis.  No acute airspace disease, effusion, or pneumothorax. No acute bony abnormalities. IMPRESSION: 1. Stable chest, no acute process. Electronically Signed   By: Randa Ngo M.D.   On: 12/02/2021 18:11     ------------------------------------------------------------------------------------------------------ Assessment/Plan: Acute GI bleeding History of Barrett's esophagus, gastric ulcer,  Recent upper GI bleeding from severe esophagitis and esophageal ulcer s/p APC's - 8/5 Presented with hemoptysis/hematemesis, dark stool and low hemoglobin Compliant to Protonix and Carafate at home.  Patient states that he is no longer taking aspirin. FOBT positive in the ED [facility] called from ED. Started on Protonix drip.  With a tentative possibility of EGD/colonoscopy, I would start the patient on clear liquid diet at this time and keep n.p.o. after midnight  Acute on chronic anemia Baseline hemoglobin close to 10.  Hemoglobin seems to be  dropping down gradually in the readings from last few days probably from slow GI bleeding.  It was 6.9 today. 2 units PRBC transfusion ordered from ED. Continue iron supplement Recent Labs    10/05/21 0216 10/18/21 0922 12/01/21 1025 12/02/21 0948 12/02/21 1606  HGB 9.8* 10.2* 7.3* 6.9* 6.9*  MCV 92.9 90 88 87 91.8   CAD/stent Ascending aortic aneurysm HLD Patient is no longer on aspirin because of recurrent GI bleeding. It seems patient is not on a statin or Zetia because of myopathy  A-fib Not on any AV nodal blocking agent.  Not in any anticoagulation.  COPD Stable respiratory status.  Continue bronchodilators.  Hypothyroidism Synthroid 75 mcg daily  Pituitary macroadenoma Cabergoline twice a week  BPH Flomax daily  Mobility -Encourage ambulation  Goals of care - -  Code Status: Full Code full code  Diet:  Diet Order             Diet clear liquid Room service appropriate? Yes; Fluid consistency: Thin  Diet effective now                  DVT prophylaxis: SCDs SCDs Start: 12/02/21 1805   Antimicrobials: None Fluid: None Consultants: GI called from ED Family Communication: Son at bedside Dispo: The patient is from: Home              Anticipated d/c is to: Home   ------------------------------------------------------------------------------------- Severity of Illness: The appropriate patient status for this patient is OBSERVATION. Observation status is judged to be reasonable and necessary in order to provide the required intensity of service to ensure the patient's safety. The patient's presenting symptoms, physical exam findings, and initial radiographic and laboratory data in the context of their medical condition is felt to place them at decreased risk for further clinical deterioration. Furthermore, it is anticipated that the patient will be medically stable for discharge from the hospital within 2 midnights of admission.    ------------------------------------------------------------------------------------  Signed, Terrilee Croak, MD [facility] 12/02/2021

## 2021-12-02 NOTE — ED Notes (Signed)
Contact son Mitzi Hansen with any questions or concerns.

## 2021-12-02 NOTE — ED Provider Notes (Signed)
[facility] DEPT Provider Note   CSN: [id number] Arrival date & time: 12/02/21  1543     History  Chief Complaint  Patient presents with   Abnormal Lab    [name] is a 88 y.o. male.  88 year old male with prior medical history as detailed below presents for evaluation.  Patient with reported dark tarry stool for the last several days.  Patient with noted drop in hemoglobin over the last several days as diagnosed by his PCP in the outpatient setting.  Patient with prior history of GI bleed.  Patient is known to [facility].  Patient denies abdominal pain.  He denies vomiting.  He denies fever.  He does report some mild weakness that is worsened over the last several days.    The history is provided by the patient and medical records.       Home Medications Prior to Admission medications   Medication Sig Start Date End Date Taking? Authorizing Provider  albuterol (VENTOLIN HFA) 108 (90 Base) MCG/ACT inhaler Inhale 2 puffs into the lungs every 6 (six) hours as needed for wheezing or shortness of breath. 07/14/21   Dettinger, [name], MD  Ascorbic Acid (VITAMIN C) 500 MG CAPS Take 1 capsule by mouth daily.    [provider]  budesonide-formoterol (SYMBICORT) 160-4.5 MCG/ACT inhaler Inhale 2 puffs into the lungs in the morning and at bedtime. 03/05/21   Margaretha Seeds, MD  cabergoline (DOSTINEX) 0.5 MG tablet Take 0.5 tablets (0.25 mg total) by mouth 2 (two) times a week. 09/23/21   Cassandria Anger, MD  calcium carbonate (OS-CAL - DOSED IN MG OF ELEMENTAL CALCIUM) 1250 (500 Ca) MG tablet Take 1 tablet by mouth daily with breakfast.    [provider]  cholecalciferol (VITAMIN D) 1000 UNITS tablet Take 1,000 Units by mouth daily.     [provider]  diclofenac Sodium (VOLTAREN) 1 % GEL Apply 2 g topically 4 (four) times daily. 07/14/21   Dettinger, [name], MD  diphenhydrAMINE (BENADRYL) 25 MG tablet Take 25 mg  by mouth every 6 (six) hours as needed.    [provider]  diphenhydramine-acetaminophen (TYLENOL PM) 25-500 MG TABS tablet Take 1/2 tablet by mouth at bedtime as needed for sleep    [provider]  FEROSUL 325 (65 Fe) MG tablet TAKE (1) TABLET DAILY WITH BREAKFAST. Patient taking differently: Take 325 mg by mouth every other day. 02/10/21   Dettinger, [name], MD  GAVILAX 17 GM/SCOOP powder DISSOLVE 17 GM SCOOP IN 8 OZ. OF WATER AND DRINK ONCE DAILY AS NEEDED FOR CONSTIPATION Patient taking differently: Take 17 g by mouth daily as needed for mild constipation. 06/29/20   Pyrtle, Lajuan Lines, MD  guaiFENesin (MUCINEX) 600 MG 12 hr tablet Take 1 tablet (600 mg total) by mouth 2 (two) times daily. 10/05/21   Bonnielee Haff, MD  levothyroxine (SYNTHROID) 75 MCG tablet Take 1 tablet (75 mcg total) by mouth daily before breakfast. 12/01/21   Dettinger, [name], MD  magnesium oxide (MAG-OX) 400 (241.3 Mg) MG tablet Take 1 tablet (400 mg total) by mouth 2 (two) times daily. Patient taking differently: Take 200 mg by mouth daily as needed (for leg cramps). 06/13/20   Cristal Deer, MD  Melatonin 5 MG CAPS Take 1 capsule by mouth at bedtime.    [provider]  naphazoline-pheniramine (NAPHCON-A) 0.025-0.3 % ophthalmic solution Place 1 drop into the left eye 2 (two) times daily as needed for  irritation or allergies.    [provider]  nitroGLYCERIN (NITROSTAT) 0.4 MG SL tablet Place 1 tablet (0.4 mg total) under the tongue every 5 (five) minutes as needed for chest pain. 07/14/21   Dettinger, [name], MD  pantoprazole (PROTONIX) 40 MG tablet Take 1 tablet (40 mg total) by mouth See admin instructions. Take '40mg'$  by mouth in the morning then take '40mg'$  every other night 11/03/21   [name], NP  psyllium (METAMUCIL) 28 % packet Take 1 packet by mouth daily. Patient taking differently: Take 1 packet by mouth daily as needed (for constipation). 06/07/21   Gwenlyn Perking, FNP   Respiratory Therapy Supplies (FLUTTER) DEVI Twice a day and prn as needed, may increase if feeling worse 05/01/18   Martyn Ehrich, NP  sodium chloride HYPERTONIC 3 % nebulizer solution Take by nebulization as needed for other. Patient taking differently: Take 4 mLs by nebulization daily. 06/18/20   Margaretha Seeds, MD  sucralfate (CARAFATE) 1 GM/10ML suspension Take 1 g by mouth 4 (four) times daily -  with meals and at bedtime.    [provider]  tamsulosin (FLOMAX) 0.4 MG CAPS capsule Take 1 capsule (0.4 mg total) by mouth daily after supper. 07/14/21   Dettinger, [name], MD  traMADol (ULTRAM) 50 MG tablet Take 1 tablet (50 mg total) by mouth 2 (two) times daily as needed. Patient taking differently: Take 50 mg by mouth 2 (two) times daily as needed for moderate pain. 09/01/21   Aundra Dubin, PA-C  triamcinolone cream (KENALOG) 0.1 % Apply 1 application topically 2 (two) times daily. Patient taking differently: Apply 1 application  topically 2 (two) times daily as needed (itching). 10/31/18   Baruch Gouty, FNP  Turmeric 500 MG CAPS Take 1 capsule by mouth daily.    [provider]  vitamin B-12 (CYANOCOBALAMIN) 1000 MCG tablet Take 1,000 mcg by mouth daily.    [provider]  vitamin E 180 MG (400 UNITS) capsule Take 400 Units by mouth daily.    [provider]      Allergies    Gadavist [gadobutrol], Prednisone, Betadine [povidone iodine], Lortab [hydrocodone-acetaminophen], Penicillins, Zetia [ezetimibe], and Zocor [simvastatin]    Review of Systems   Review of Systems  All other systems reviewed and are negative.   Physical Exam Updated Vital Signs BP 128/78 (BP Location: Left Arm)   Pulse 89   Temp 98.2 F (36.8 C) (Oral)   Resp (!) 25   Ht '5\' 7"'$  (1.702 m)   Wt 68.9 kg   SpO2 98%   BMI 23.81 kg/m  Physical Exam Vitals and nursing note reviewed.  Constitutional:      General: He is not in acute distress.    Appearance: Normal  appearance. He is well-developed.  HENT:     Head: Normocephalic and atraumatic.  Eyes:     Conjunctiva/sclera: Conjunctivae normal.     Pupils: Pupils are equal, round, and reactive to light.  Cardiovascular:     Rate and Rhythm: Normal rate and regular rhythm.     Heart sounds: Normal heart sounds.  Pulmonary:     Effort: Pulmonary effort is normal. No respiratory distress.     Breath sounds: Normal breath sounds.  Abdominal:     General: There is no distension.     Palpations: Abdomen is soft.     Tenderness: There is no abdominal tenderness.  Genitourinary:    Comments: Melanotic stool that is guaiac positive  present on DRE. Musculoskeletal:        General: No deformity. Normal range of motion.     Cervical back: Normal range of motion and neck supple.  Skin:    General: Skin is warm and dry.  Neurological:     General: No focal deficit present.     Mental Status: He is alert and oriented to person, place, and time.     ED Results / Procedures / Treatments   Labs (all labs ordered are listed, but only abnormal results are displayed) Labs Reviewed  CBC - Abnormal; Notable for the following components:      Result Value   RBC 2.44 (*)    Hemoglobin 6.9 (*)    HCT 22.4 (*)    RDW 17.0 (*)    All other components within normal limits  COMPREHENSIVE METABOLIC PANEL - Abnormal; Notable for the following components:   Glucose, Bld 111 (*)    BUN 26 (*)    Calcium 8.3 (*)    Total Protein 5.9 (*)    Albumin 3.4 (*)    Anion gap 4 (*)    All other components within normal limits  POC OCCULT BLOOD, ED - Abnormal; Notable for the following components:   Fecal Occult Bld POSITIVE (*)    All other components within normal limits  URINALYSIS, ROUTINE W REFLEX MICROSCOPIC  BASIC METABOLIC PANEL  CBC  TYPE AND SCREEN  PREPARE RBC (CROSSMATCH)    EKG EKG Interpretation  Date/Time:  Thursday December 02 2021 15:51:21 EDT Ventricular Rate:  93 PR Interval:  140 QRS  Duration: 138 QT Interval:  388 QTC Calculation: 483 R Axis:   71 Text Interpretation: Sinus tachycardia Multiple ventricular premature complexes Right bundle branch block Confirmed by Dene Gentry [phone]) on 12/02/2021 5:14:55 PM  Radiology DG Chest Port 1 View  Result Date: 12/02/2021 CLINICAL DATA:  Weakness EXAM: PORTABLE CHEST 1 VIEW COMPARISON:  09/30/2021 FINDINGS: Single frontal view of the chest demonstrates a stable cardiac silhouette. Chronic elevation of the left hemidiaphragm. Stable areas of bibasilar scarring and bronchiectasis. No acute airspace disease, effusion, or pneumothorax. No acute bony abnormalities. IMPRESSION: 1. Stable chest, no acute process. Electronically Signed   By: Randa Ngo M.D.   On: 12/02/2021 18:11    Procedures Procedures    Medications Ordered in ED Medications  acetaminophen (TYLENOL) tablet 650 mg (has no administration in time range)    Or  acetaminophen (TYLENOL) suppository 650 mg (has no administration in time range)  albuterol (PROVENTIL) (2.5 MG/3ML) 0.083% nebulizer solution 2.5 mg (has no administration in time range)  hydrALAZINE (APRESOLINE) injection 10 mg (has no administration in time range)  zolpidem (AMBIEN) tablet 5 mg (has no administration in time range)  albuterol (VENTOLIN HFA) 108 (90 Base) MCG/ACT inhaler 2 puff (has no administration in time range)  fluticasone furoate-vilanterol (BREO ELLIPTA) 200-25 MCG/ACT 1 puff (has no administration in time range)  cabergoline (DOSTINEX) tablet 0.25 mg (has no administration in time range)  ferrous sulfate tablet 325 mg (has no administration in time range)  guaiFENesin (MUCINEX) 12 hr tablet 600 mg (has no administration in time range)  levothyroxine (SYNTHROID) tablet 75 mcg (has no administration in time range)  magnesium oxide (MAG-OX) tablet 200 mg (has no administration in time range)  Melatonin CAPS 5 mg (has no administration in time range)  sucralfate (CARAFATE) 1  GM/10ML suspension 1 g (has no administration in time range)  tamsulosin (FLOMAX) capsule 0.4 mg (has no administration  in time range)  cyanocobalamin (VITAMIN B12) tablet 1,000 mcg (has no administration in time range)  pantoprazole (PROTONIX) 80 mg /NS 100 mL IVPB (0 mg Intravenous Stopped 12/02/21 1834)  0.9 %  sodium chloride infusion (10 mL/hr Intravenous New Bag/Given 12/02/21 1803)    ED Course/ Medical Decision Making/ A&P                           Medical Decision Making Amount and/or Complexity of Data Reviewed Labs: ordered. Radiology: ordered.  Risk Prescription drug management. Decision regarding hospitalization.    Medical Screen Complete  This patient presented to the ED with complaint of GI bleed.  This complaint involves an extensive number of treatment options. The initial differential diagnosis includes, but is not limited to, GI bleed  This presentation is: Acute, Self-Limited, Previously Undiagnosed, Uncertain Prognosis, Complicated, Systemic Symptoms, and Threat to Life/Bodily Function  Patient with prior history of GI bleed presents with concern for possible recurrent GI bleed.  Patient with normal hemodynamics.  Patient with noted gradual drop in hemoglobin over the last several days.    Patient with melena present on exam.  Patient with symptomatic anemia.  Patient would benefit from transfusion.  Hemoglobin today is 6.9.  Patient will require admission for further work-up and treatment.  Hospitalist service made aware of case and will evaluate for admission.  Additional history obtained: External records from outside sources obtained and reviewed including prior ED visits and prior Inpatient records.    Lab Tests:  I ordered and personally interpreted labs.  The pertinent results include: CBC, CMP, type and screen   Cardiac Monitoring:  The patient was maintained on a cardiac monitor.  I personally viewed and interpreted the cardiac monitor  which showed an underlying rhythm of: NSR    Medicines ordered:  I ordered medication including protonix, PRBC for GI bleed, anemia  Reevaluation of the patient after these medicines showed that the patient: improved    Problem List / ED Course:  GI bleed   Reevaluation:  After the interventions noted above, I reevaluated the patient and found that they have: improved   Disposition:  After consideration of the diagnostic results and the patients response to treatment, I feel that the patent would benefit from admission.   CRITICAL CARE Performed by: Valarie Merino   Total critical care time: 30 minutes  Critical care time was exclusive of separately billable procedures and treating other patients.  Critical care was necessary to treat or prevent imminent or life-threatening deterioration.  Critical care was time spent personally by me on the following activities: development of treatment plan with patient and/or surrogate as well as nursing, discussions with consultants, evaluation of patient's response to treatment, examination of patient, obtaining history from patient or surrogate, ordering and performing treatments and interventions, ordering and review of laboratory studies, ordering and review of radiographic studies, pulse oximetry and re-evaluation of patient's condition.          Final Clinical Impression(s) / ED Diagnoses Final diagnoses:  Gastrointestinal hemorrhage, unspecified gastrointestinal hemorrhage type    Rx / DC Orders ED Discharge Orders     None         Valarie Merino, MD 12/02/21 1912

## 2021-12-02 NOTE — ED Provider Triage Note (Signed)
Emergency Medicine Provider Triage Evaluation Note  [name] , a 88 y.o. male  was evaluated in triage.  Pt complains of low hemoglobin.  Reports that for the past 3 days he has noted some dark stools and had some episodes of bloody emesis.  No longer has bloody emesis but still having dark stools.  Has been following with PCP over the past 3 days associated.  Has been lower every day and has been checked.  Apparently 6.9 today  Review of Systems  Positive: Lightheadedness, fatigue, syncope? Negative: Shortness of breath  Physical Exam  BP (!) 158/80   Pulse 91   Temp 98.1 F (36.7 C) (Oral)   Resp 16   Ht '5\' 7"'$  (1.702 m)   Wt 68.9 kg   SpO2 95%   BMI 23.81 kg/m  Gen:   Awake, no distress   Resp:  Normal effort  MSK:   Moves extremities without difficulty  Other:  Pale, not tachycardic  Medical Decision Making  Medically screening exam initiated at 3:57 PM.  Appropriate orders placed.  [name] was informed that the remainder of the evaluation will be completed by another provider, this initial triage assessment does not replace that evaluation, and the importance of remaining in the ED until their evaluation is complete.    Rhae Hammock, PA-C 12/02/21 1559

## 2021-12-03 ENCOUNTER — Other Ambulatory Visit: Payer: Self-pay

## 2021-12-03 ENCOUNTER — Observation Stay (HOSPITAL_BASED_OUTPATIENT_CLINIC_OR_DEPARTMENT_OTHER): Payer: Medicare Other | Admitting: Certified Registered Nurse Anesthetist

## 2021-12-03 ENCOUNTER — Observation Stay (HOSPITAL_COMMUNITY): Payer: Medicare Other | Admitting: Certified Registered Nurse Anesthetist

## 2021-12-03 ENCOUNTER — Encounter (HOSPITAL_COMMUNITY)
Admission: EM | Disposition: A | Discharge status: Home / Self Care | Payer: Self-pay | Source: Home / Self Care | Attending: Emergency Medicine

## 2021-12-03 ENCOUNTER — Encounter (HOSPITAL_COMMUNITY): Payer: Self-pay | Admitting: Internal Medicine

## 2021-12-03 DIAGNOSIS — K222 Esophageal obstruction: Secondary | ICD-10-CM

## 2021-12-03 DIAGNOSIS — K2100 Gastro-esophageal reflux disease with esophagitis, without bleeding: Secondary | ICD-10-CM

## 2021-12-03 DIAGNOSIS — D62 Acute posthemorrhagic anemia: Secondary | ICD-10-CM

## 2021-12-03 DIAGNOSIS — K449 Diaphragmatic hernia without obstruction or gangrene: Secondary | ICD-10-CM | POA: Diagnosis not present

## 2021-12-03 DIAGNOSIS — J449 Chronic obstructive pulmonary disease, unspecified: Secondary | ICD-10-CM

## 2021-12-03 DIAGNOSIS — K922 Gastrointestinal hemorrhage, unspecified: Secondary | ICD-10-CM | POA: Diagnosis not present

## 2021-12-03 DIAGNOSIS — K921 Melena: Secondary | ICD-10-CM | POA: Diagnosis not present

## 2021-12-03 HISTORY — PX: ESOPHAGOGASTRODUODENOSCOPY (EGD) WITH PROPOFOL: SHX5813

## 2021-12-03 LAB — CBC
HCT: 28.3 % — ABNORMAL LOW (ref 39.0–52.0)
Hemoglobin: 9 g/dL — ABNORMAL LOW (ref 13.0–17.0)
MCH: 27.5 pg (ref 26.0–34.0)
MCHC: 31.8 g/dL (ref 30.0–36.0)
MCV: 86.5 fL (ref 80.0–100.0)
Platelets: 224 10*3/uL (ref 150–400)
RBC: 3.27 MIL/uL — ABNORMAL LOW (ref 4.22–5.81)
RDW: 17.5 % — ABNORMAL HIGH (ref 11.5–15.5)
WBC: 11.6 10*3/uL — ABNORMAL HIGH (ref 4.0–10.5)
nRBC: 0 % (ref 0.0–0.2)

## 2021-12-03 LAB — URINALYSIS, ROUTINE W REFLEX MICROSCOPIC
Bilirubin Urine: NEGATIVE
Glucose, UA: NEGATIVE mg/dL
Hgb urine dipstick: NEGATIVE
Ketones, ur: NEGATIVE mg/dL
Leukocytes,Ua: NEGATIVE
Nitrite: NEGATIVE
Protein, ur: NEGATIVE mg/dL
Specific Gravity, Urine: 1.014 (ref 1.005–1.030)
pH: 6 (ref 5.0–8.0)

## 2021-12-03 LAB — BASIC METABOLIC PANEL
Anion gap: 6 (ref 5–15)
BUN: 17 mg/dL (ref 8–23)
CO2: 22 mmol/L (ref 22–32)
Calcium: 7.7 mg/dL — ABNORMAL LOW (ref 8.9–10.3)
Chloride: 109 mmol/L (ref 98–111)
Creatinine, Ser: 0.87 mg/dL (ref 0.61–1.24)
GFR, Estimated: >60 mL/min (ref >60)
Glucose, Bld: 84 mg/dL (ref 70–99)
Potassium: 3.3 mmol/L — ABNORMAL LOW (ref 3.5–5.1)
Sodium: 137 mmol/L (ref 135–145)

## 2021-12-03 SURGERY — ESOPHAGOGASTRODUODENOSCOPY (EGD) WITH PROPOFOL
Anesthesia: Monitor Anesthesia Care

## 2021-12-03 MED ORDER — PROPOFOL 10 MG/ML IV BOLUS
INTRAVENOUS | Status: DC | PRN
  Administered 2021-12-03: 20 mg via INTRAVENOUS

## 2021-12-03 MED ORDER — PEG-KCL-NACL-NASULF-NA ASC-C 100 G PO SOLR
0.5000 | Freq: Once | ORAL | Status: AC
  Administered 2021-12-03: 100 g via ORAL
  Filled 2021-12-03: qty 1

## 2021-12-03 MED ORDER — PHENYLEPHRINE 80 MCG/ML (10ML) SYRINGE FOR IV PUSH (FOR BLOOD PRESSURE SUPPORT)
PREFILLED_SYRINGE | INTRAVENOUS | Status: DC | PRN
  Administered 2021-12-03: 80 ug via INTRAVENOUS

## 2021-12-03 MED ORDER — LIDOCAINE 2% (20 MG/ML) 5 ML SYRINGE
INTRAMUSCULAR | Status: DC | PRN
  Administered 2021-12-03: 80 mg via INTRAVENOUS

## 2021-12-03 MED ORDER — PEG-KCL-NACL-NASULF-NA ASC-C 100 G PO SOLR
0.5000 | Freq: Once | ORAL | Status: AC
  Administered 2021-12-04: 100 g via ORAL

## 2021-12-03 MED ORDER — PEG-KCL-NACL-NASULF-NA ASC-C 100 G PO SOLR: 1.0000 | Freq: Once | ORAL | Status: DC

## 2021-12-03 MED ORDER — EPHEDRINE SULFATE-NACL 50-0.9 MG/10ML-% IV SOSY
PREFILLED_SYRINGE | INTRAVENOUS | Status: DC | PRN
  Administered 2021-12-03: 5 mg via INTRAVENOUS

## 2021-12-03 MED ORDER — PROPOFOL 500 MG/50ML IV EMUL
INTRAVENOUS | Status: DC | PRN
  Administered 2021-12-03: 100 ug/kg/min via INTRAVENOUS

## 2021-12-03 MED ORDER — LACTATED RINGERS IV SOLN: INTRAVENOUS | Status: DC

## 2021-12-03 SURGICAL SUPPLY — 15 items

## 2021-12-03 NOTE — ED Notes (Signed)
Patient transported for EGD.

## 2021-12-03 NOTE — Progress Notes (Signed)
ATTEMPTED TO CALL REPORT TO ED NURSE.  NO ANSWER AFTER LONG HOLD TIME.  WILL GIVE REPORT BEDSIDE.  PT DRINKING WATER W/OUT PROBLEM.  SON AT BEDSIDE

## 2021-12-03 NOTE — Progress Notes (Signed)
PROGRESS NOTE  [name]  DOB: November 14, 1932  PCP: Worthy Rancher, MD XTK:[id number]  DOA: 12/02/2021  LOS: 0 days  Hospital Day: 2  Brief narrative: [name] is a 88 y.o. male with PMH significant for Barrett's esophagus, gastric ulcer, upper GI bleeding from GE junction, chronic anemia, HTN, HLD, CAD/stent, A-fib, ascending aortic aneurysm, hypothyroidism, pituitary macroadenoma, anxiety/depression. Patient was sent to the ED today by his PCP because of low hemoglobin. Patient was recently hospitalized 8/3 to 8/8 for upper GI bleeding.  EGD on 8/5 showed severe esophagitis with bleeding and esophageal ulcer with symptoms of recent bleeding that was treated with APCs.   Patient states he has been compliant to Protonix and Carafate as recommended For the last several days, patient has been feeling progressive fatigue.  On Monday 10/2, patient had a small amount of hemoptysis/hematemesis.  He also had a near syncope that day. Blood work done at PCP yesterday 10/4 showed hemoglobin of 7.3 and repeat blood work today showed hemoglobin of 6.9 and hence patient was sent to ED.  In the ED, patient was afebrile, hemodynamically stable Labs showed WBC count 9.8, hemoglobin 6.9, platelet 253 FOBT positive 2 units of PRBC transfused GI called Patient was admitted to hospitalist service for further evaluation management   Subjective: Patient was seen and examined this morning.  Pleasant elderly Caucasian male. Propped up in bed.  Not in distress.  Remains n.p.o. for EGD today.  Assessment/Plan: Acute GI bleeding History of Barrett's esophagus, gastric ulcer,  Recent upper GI bleeding from severe esophagitis and esophageal ulcer s/p APC's - 8/5 Presented with hemoptysis/hematemesis, dark stool and low hemoglobin Compliant to Protonix and Carafate at home.  Patient states that he is no longer taking aspirin. FOBT positive in the ED Started on Protonix drip.  GI consult obtained.   Plan of EGD today.  Remains n.p.o. at this time.  Acute on chronic anemia Baseline hemoglobin close to 10.  Hemoglobin seems to be dropping down gradually in the readings from last few days probably from slow GI bleeding.  It was 6.9 on admission yesterday. 2 units PRBC transfused.  Hemoglobin improved to 9 today. Continue iron supplement Recent Labs    10/18/21 0922 12/01/21 1025 12/02/21 0948 12/02/21 1606 12/03/21 0546  HGB 10.2* 7.3* 6.9* 6.9* 9.0*  MCV 90 88 87 91.8 86.5   CAD/stent Ascending aortic aneurysm HLD Patient is no longer on aspirin because of recurrent GI bleeding. It seems patient is not on a statin or Zetia because of myopathy  A-fib Not on any AV nodal blocking agent.  Not in any anticoagulation.  COPD Stable respiratory status.  Continue bronchodilators.  Hypothyroidism Synthroid 75 mcg daily  Pituitary macroadenoma Cabergoline twice a week  BPH Flomax daily  Mobility Encourage ambulation  Goals of care - -  Code Status: Full Code full code  Diet:  Diet Order             Diet NPO time specified  Diet effective now                  DVT prophylaxis: SCDs SCDs Start: 12/02/21 1805   Antimicrobials: None Fluid: None Consultants: GI called from ED Family Communication: Son at bedside  Status is: Observation  Continue in-hospital care because: Pending EGD today Level of care: Telemetry   Dispo: The patient is from: Home              Anticipated d/c is to: Home in  1 to 2 days              Patient currently is not medically stable to d/c.   Difficult to place patient No     Infusions:   pantoprazole 8 mg/hr (12/03/21 0816)    Scheduled Meds:  albuterol  2.5 mg Nebulization BID   cabergoline  0.25 mg Oral Once per day on Mon Thu   cyanocobalamin  1,000 mcg Oral Daily   ferrous sulfate  325 mg Oral QODAY   fluticasone furoate-vilanterol  1 puff Inhalation Daily   guaiFENesin  600 mg Oral BID   levothyroxine  75 mcg  Oral Q0600   [START ON 12/06/2021] pantoprazole  40 mg Intravenous Q12H   sucralfate  1 g Oral TID WC & HS   tamsulosin  0.4 mg Oral QPC supper    PRN meds: acetaminophen **OR** acetaminophen, albuterol, hydrALAZINE, magnesium oxide, melatonin, zolpidem   Antimicrobials: Anti-infectives (From admission, onward)    None       Objective: Vitals:   12/03/21 1217 12/03/21 1230  BP:  126/73  Pulse:  (!) 59  Resp:  16  Temp: 97.6 F (36.4 C)   SpO2:  94%    Intake/Output Summary (Last 24 hours) at 12/03/2021 1301 Last data filed at 12/03/2021 0603 Gross per 24 hour  Intake 2600 ml  Output --  Net 2600 ml   Filed Weights   12/02/21 1553  Weight: 68.9 kg   Weight change:  Body mass index is 23.81 kg/m.   Physical Exam: General exam: Pleasant, elderly Caucasian male.  Not in physical distress Skin: No rashes, lesions or ulcers. HEENT: Atraumatic, normocephalic, no obvious bleeding Lungs: Clear to auscultation bilaterally CVS: Regular rate and rhythm, no murmur GI/Abd soft, mild epigastric tenderness, nondistended, bowel sound present CNS: Alert, awake, oriented x3.  Slow to respond Psychiatry: Mood appropriate Extremities: No pedal edema, no calf tenderness  Data Review: I have personally reviewed the laboratory data and studies available.  F/u labs ordered Unresulted Labs (From admission, onward)     Start     Ordered   12/04/21 0500  CBC with Differential/Platelet  Tomorrow morning,   R        12/03/21 1300   12/04/21 3244  Basic metabolic panel  Tomorrow morning,   R        12/03/21 1300   12/02/21 1553  Urinalysis, Routine w reflex microscopic  Once,   URGENT        12/02/21 1553            Signed, Terrilee Croak, MD [facility] 12/03/2021

## 2021-12-03 NOTE — Anesthesia Preprocedure Evaluation (Addendum)
Anesthesia Evaluation  Patient identified by MRN, date of birth, ID band Patient awake    Reviewed: Allergy & Precautions, NPO status , Patient's Chart, lab work & pertinent test results  History of Anesthesia Complications Negative for: history of anesthetic complications  Airway Mallampati: II  TM Distance: >3 FB Neck ROM: Full    Dental  (+) Missing, Chipped, Dental Advisory Given   Pulmonary COPD (Home O2 but seldom uses),  COPD inhaler,    breath sounds clear to auscultation       Cardiovascular (-) angina+ CAD, + Cardiac Stents and + Peripheral Vascular Disease (AAA)  + dysrhythmias Atrial Fibrillation  Rhythm:Regular Rate:Normal  09/2021 ECHO: EF 60-65%, normal LVF, Grade 2 DD, normal RVF, mild MR, trivial AI   Neuro/Psych Anxiety Depression negative neurological ROS     GI/Hepatic Neg liver ROS, GERD  Medicated and Controlled,GI bleed   Endo/Other  Hypothyroidism   Renal/GU negative Renal ROS     Musculoskeletal  (+) Arthritis ,    Abdominal   Peds  Hematology  (+) Blood dyscrasia (Hb 9.5), anemia ,   Anesthesia Other Findings   Reproductive/Obstetrics                           Anesthesia Physical Anesthesia Plan  ASA: 3  Anesthesia Plan: MAC   Post-op Pain Management: Minimal or no pain anticipated   Induction:   PONV Risk Score and Plan: 1 and Treatment may vary due to age or medical condition and Ondansetron  Airway Management Planned: Natural Airway and Simple Face Mask  Additional Equipment: None  Intra-op Plan:   Post-operative Plan:   Informed Consent: I have reviewed the patients History and Physical, chart, labs and discussed the procedure including the risks, benefits and alternatives for the proposed anesthesia with the patient or authorized representative who has indicated his/her understanding and acceptance.     Dental advisory given and Consent  reviewed with POA  Plan Discussed with: CRNA and Surgeon  Anesthesia Plan Comments:        Anesthesia Quick Evaluation

## 2021-12-03 NOTE — H&P (View-Only) (Signed)
Referring Provider: EDP Primary Care Physician:  Dettinger, [name], MD Primary Gastroenterologist:  Dr. Hilarie Fredrickson  Reason for Consultation:  Anemia., UGI bleed  HPI: [name] is a 88 y.o. male with a past medical history significant for chronic constipation, bronchiectasis, A-fib not currently on anticoagulation, ascending aortic aneurysm, prolactinoma, history of GERD with severe esophagitis/esophageal ulcer, remote Barrett's esophagus, history of esophageal stricture status post dilatation, history of hiatal hernia repair with recurrence, colon polyps. See PMH /PSH for additional history.  He was seen by our service in August for upper GI bleed with EGD as follows.  EGD 10/02/2021:  - The examined portions of the nasopharynx, oropharynx and larynx were normal. - Clotted blood in the lower third of the esophagus. - Severe esophagitis with bleeding. Treated with argon plasma coagulation (APC). This esophagitis may be reflux or stasis in etiology - Esophageal ulcer with stigmata of recent bleeding. - Large hiatal hernia. - No specimens collected.  He followed up in our office in early September.  Plan was to consider repeat EGD to evaluate healing, but he was feeling better at that time and they elected not to proceed with that.  He was on pantoprazole 40 mg twice daily, but the newest prescription says once in the morning and every other night so that is what he has been doing.  Not sure what happened with that.  He has also been doing Carafate 4 times a day.  He was sent into the emergency department on this occasion for low hemoglobin as an outpatient, found to be 6.9 g.  He says that earlier this week he had a small amount of dark stools and "coughed up" a small amount of blood.  That seemed to clear up.  Here he was Hemoccult positive.  He has been transfused 2 units packed red blood cells and hemoglobin is increased to 9.0 g.  He is on PPI drip.  BUN is normal.   Past Medical  History:  Diagnosis Date   Abnormality of gait 06/07/2013   Anemia    Aneurysm ([place])    on assending aorta, currently watching it, Dr Cyndia Bent   Anxiety    Arthritis    Asthma    Atrial fibrillation ([place])    B12 deficiency    Barrett's esophagus    CAD (coronary artery disease) 03/2006   3.0 x 20 mm TAXUS Perseus DES to the LAD; 01/2007  L main 30%, oLAD 50%, pLAD stent ok, CFX 80%, OM 60%, pRCA 60%, mRCA 70%, oPDA 90%; med rx    Cancer ([place])    skin cancer    Cataract    bilateral removal of cateracts   COLONIC POLYPS 07/17/2008   Qualifier: Diagnosis of  By: Lovette Cliche, CNA, Christy     Complication of anesthesia     no issues,but pt prefers spinal due to Pulmonary problems   COPD (chronic obstructive pulmonary disease) ([place])    oxygen  on standby in home.   Depression    Diverticulosis    Emphysema    Enlarged prostate with urinary retention    Esophageal stenosis    Gastric ulcer    GERD (gastroesophageal reflux disease)    Hiatal hernia    Hypothyroidism    Neuropathy    Osteoporosis    Pituitary macroadenoma (H[facility])    Restless legs syndrome (RLS)    Tubular adenoma of colon    UGIB (upper gastrointestinal bleed) 03/2013   EGD w/ large ulcer at GE junction  Past Surgical History:  Procedure Laterality Date   ABDOMINAL HERNIA REPAIR   2008   BIOPSY  06/13/2020   Procedure: BIOPSY;  Surgeon: Montez Morita, Quillian Quince, MD;  [facility];  Service: Gastroenterology;;  gastric   CARDIAC CATHETERIZATION  01/2007   L main 30%, oLAD 50%,  pLAD stent ok, CFX 80%, OM 60%, pRCA 60%, mRCA 70%, oPDA 90%; med rx   cataract extraction both eyes     COLONOSCOPY     COLONOSCOPY WITH PROPOFOL N/A 06/13/2020   Procedure: COLONOSCOPY WITH PROPOFOL;  Surgeon: Harvel Quale, MD;  [facility];  Service: Gastroenterology;  Laterality: N/A;   CORONARY ANGIOPLASTY     CORONARY STENT PLACEMENT  03/2006   3.0 x 20 mm TAXUS Perseus DES to the LAD   CYSTOSCOPY  N/A 06/10/2015   Procedure: CYSTOSCOPY FULGRATION OF BLEEDING,  electovapor resection;  Surgeon: Irine Seal, MD;  [facility]: [facility];  Service: Urology;  Laterality: N/A;   CYSTOSCOPY WITH INSERTION OF UROLIFT N/A 06/01/2015   Procedure: CYSTOSCOPY WITH INSERTION OF UROLIFT x4;  Surgeon: Irine Seal, MD;  [facility];  Service: Urology;  Laterality: N/A;   ESOPHAGOGASTRODUODENOSCOPY N/A 04/20/2013   Procedure: ESOPHAGOGASTRODUODENOSCOPY (EGD);  Surgeon: Inda Castle, MD;  [facility];  Service: Endoscopy;  Laterality: N/A;   ESOPHAGOGASTRODUODENOSCOPY N/A 03/25/2016   Procedure: ESOPHAGOGASTRODUODENOSCOPY (EGD);  Surgeon: Irene Shipper, MD;  Location: [facility];  Service: Endoscopy;  Laterality: N/A;   ESOPHAGOGASTRODUODENOSCOPY (EGD) WITH PROPOFOL N/A 10/13/2015   Procedure: ESOPHAGOGASTRODUODENOSCOPY (EGD) WITH PROPOFOL;  Surgeon: Jerene Bears, MD;  [facility];  Service: Gastroenterology;  Laterality: N/A;   ESOPHAGOGASTRODUODENOSCOPY (EGD) WITH PROPOFOL N/A 06/13/2020   Procedure: ESOPHAGOGASTRODUODENOSCOPY (EGD) WITH PROPOFOL;  Surgeon: Harvel Quale, MD;  [facility];  Service: Gastroenterology;  Laterality: N/A;   ESOPHAGOGASTRODUODENOSCOPY (EGD) WITH PROPOFOL N/A 10/02/2021   Procedure: ESOPHAGOGASTRODUODENOSCOPY (EGD) WITH PROPOFOL;  Surgeon: Daryel November, MD;  [facility];  Service: Gastroenterology;  Laterality: N/A;   FOOT SURGERY  1994 left, 2002 right foot   bilateral foot reconstruciton   FOOT SURGERY     reconstruction of both feet- no retained hardware.   HERNIA REPAIR     HIATAL HERNIA REPAIR  01-04-2008   HOT HEMOSTASIS N/A 10/02/2021   Procedure: HOT HEMOSTASIS (ARGON PLASMA COAGULATION/BICAP);  Surgeon: Daryel November, MD;  Location: [facility];  Service: Gastroenterology;  Laterality: N/A;   JOINT REPLACEMENT     MELANOMA EXCISION  2019   POLYPECTOMY     POLYPECTOMY  06/13/2020   Procedure: POLYPECTOMY;   Surgeon: Montez Morita, Quillian Quince, MD;  [facility];  Service: Gastroenterology;;  ascending,    PROSTATE SURGERY     x 2   SAVORY DILATION N/A 03/25/2016   Procedure: SAVORY DILATION;  Surgeon: Irene Shipper, MD;  [facility];  Service: Endoscopy;  Laterality: N/A;   TOTAL HIP ARTHROPLASTY  07/21/2011   Procedure: TOTAL HIP ARTHROPLASTY ANTERIOR APPROACH;  Surgeon: Mauri Pole, MD;  [facility];  Service: Orthopedics;  Laterality: Left;   TOTAL HIP ARTHROPLASTY Right 09/07/2012   Procedure: RIGHT TOTAL HIP ARTHROPLASTY ANTERIOR APPROACH;  Surgeon: Mcarthur Rossetti, MD;  [facility];  Service: Orthopedics;  Laterality: Right;   TRANSURETHRAL RESECTION OF BLADDER NECK N/A 11/04/2015   Procedure: RESECTION OF BLADDER NECK;  Surgeon: Cleon Gustin, MD;  [facility];  Service: Urology;  Laterality: N/A;   TRANSURETHRAL RESECTION  OF PROSTATE N/A 11/04/2015   Procedure: TRANSURETHRAL RESECTION OF THE PROSTATE (TURP); REMOVAL OF UROLIFT IMPLANTS X THREE;  Surgeon: Cleon Gustin, MD;  [facility];  Service: Urology;  Laterality: N/A;   UPPER GASTROINTESTINAL ENDOSCOPY  12/2013   Dr Hilarie Fredrickson, gastritis   VIDEO BRONCHOSCOPY Bilateral 02/01/2017   Procedure: VIDEO BRONCHOSCOPY WITHOUT FLUORO;  Surgeon: Tanda Rockers, MD;  [facility];  Service: Cardiopulmonary;  Laterality: Bilateral;    Prior to Admission medications   Medication Sig Start Date End Date Taking? Authorizing Provider  albuterol (VENTOLIN HFA) 108 (90 Base) MCG/ACT inhaler Inhale 2 puffs into the lungs every 6 (six) hours as needed for wheezing or shortness of breath. 07/14/21  Yes Dettinger, [name], MD  Ascorbic Acid (VITAMIN C) 500 MG CAPS Take 1 capsule by mouth daily.   Yes [provider]  budesonide-formoterol (SYMBICORT) 160-4.5 MCG/ACT inhaler Inhale 2 puffs into the lungs in the morning and at bedtime. 03/05/21  Yes Margaretha Seeds, MD  cabergoline  (DOSTINEX) 0.5 MG tablet Take 0.5 tablets (0.25 mg total) by mouth 2 (two) times a week. 09/23/21  Yes Nida, Marella Chimes, MD  calcium carbonate (OS-CAL - DOSED IN MG OF ELEMENTAL CALCIUM) 1250 (500 Ca) MG tablet Take 1 tablet by mouth daily with breakfast.   Yes [provider]  cholecalciferol (VITAMIN D) 1000 UNITS tablet Take 1,000 Units by mouth daily.    Yes [provider]  diclofenac Sodium (VOLTAREN) 1 % GEL Apply 2 g topically 4 (four) times daily. Patient taking differently: Apply 2 g topically daily as needed (For pain). 07/14/21  Yes Dettinger, [name], MD  diphenhydrAMINE (BENADRYL) 25 MG tablet Take 25 mg by mouth every 6 (six) hours as needed for itching.   Yes [provider]  diphenhydramine-acetaminophen (TYLENOL PM) 25-500 MG TABS tablet Take 1/2 tablet by mouth at bedtime as needed for sleep   Yes [provider]  FEROSUL 325 (65 Fe) MG tablet TAKE (1) TABLET DAILY WITH BREAKFAST. Patient taking differently: Take 325 mg by mouth every other day. 02/10/21  Yes Dettinger, [name], MD  GAVILAX 17 GM/SCOOP powder DISSOLVE 17 GM SCOOP IN 8 OZ. OF WATER AND DRINK ONCE DAILY AS NEEDED FOR CONSTIPATION Patient taking differently: Take 17 g by mouth daily as needed for mild constipation. 06/29/20  Yes Pyrtle, Lajuan Lines, MD  guaiFENesin (MUCINEX) 600 MG 12 hr tablet Take 1 tablet (600 mg total) by mouth 2 (two) times daily. 10/05/21  Yes Bonnielee Haff, MD  levothyroxine (SYNTHROID) 75 MCG tablet Take 1 tablet (75 mcg total) by mouth daily before breakfast. 12/01/21  Yes Dettinger, [name], MD  magnesium oxide (MAG-OX) 400 (241.3 Mg) MG tablet Take 1 tablet (400 mg total) by mouth 2 (two) times daily. Patient taking differently: Take 200 mg by mouth daily as needed (for leg cramps). 06/13/20  Yes Cristal Deer, MD  Melatonin 5 MG CAPS Take 1 capsule by mouth at bedtime.   Yes [provider]  naphazoline-pheniramine (NAPHCON-A) 0.025-0.3 %  ophthalmic solution Place 1 drop into the left eye 2 (two) times daily as needed for irritation or allergies.   Yes [provider]  nitroGLYCERIN (NITROSTAT) 0.4 MG SL tablet Place 1 tablet (0.4 mg total) under the tongue every 5 (five) minutes as needed for chest pain. 07/14/21  Yes Dettinger, [name], MD  pantoprazole (PROTONIX) 40 MG tablet Take 1 tablet (40 mg total) by mouth See admin instructions. Take '40mg'$  by  mouth in the morning then take '40mg'$  every other night 11/03/21  Yes [name], NP  psyllium (METAMUCIL) 28 % packet Take 1 packet by mouth daily. Patient taking differently: Take 1 packet by mouth daily as needed (for constipation). 06/07/21  Yes Gwenlyn Perking, FNP  sucralfate (CARAFATE) 1 GM/10ML suspension Take 1 g by mouth 4 (four) times daily -  with meals and at bedtime.   Yes [provider]  tamsulosin (FLOMAX) 0.4 MG CAPS capsule Take 1 capsule (0.4 mg total) by mouth daily after supper. 07/14/21  Yes Dettinger, [name], MD  traMADol (ULTRAM) 50 MG tablet Take 1 tablet (50 mg total) by mouth 2 (two) times daily as needed. Patient taking differently: Take 50 mg by mouth 2 (two) times daily as needed for moderate pain. 09/01/21  Yes Aundra Dubin, PA-C  triamcinolone cream (KENALOG) 0.1 % Apply 1 application topically 2 (two) times daily. Patient taking differently: Apply 1 application  topically 2 (two) times daily as needed (itching). 10/31/18  Yes Rakes, Connye Burkitt, FNP  Turmeric 500 MG CAPS Take 1 capsule by mouth daily.   Yes [provider]  vitamin B-12 (CYANOCOBALAMIN) 1000 MCG tablet Take 1,000 mcg by mouth daily.   Yes [provider]  vitamin E 180 MG (400 UNITS) capsule Take 400 Units by mouth daily.   Yes [provider]  Respiratory Therapy Supplies (FLUTTER) DEVI Twice a day and prn as needed, may increase if feeling worse 05/01/18   Martyn Ehrich, NP  sodium chloride HYPERTONIC 3 % nebulizer solution Take by  nebulization as needed for other. Patient not taking: Reported on 12/02/2021 06/18/20   Margaretha Seeds, MD    Current Facility-Administered Medications  Medication Dose Route Frequency Provider Last Rate Last Admin   acetaminophen (TYLENOL) tablet 650 mg  650 mg Oral Q6H PRN Dahal, Marlowe Aschoff, MD       Or   acetaminophen (TYLENOL) suppository 650 mg  650 mg Rectal Q6H PRN Dahal, Marlowe Aschoff, MD       albuterol (PROVENTIL) (2.5 MG/3ML) 0.083% nebulizer solution 2.5 mg  2.5 mg Nebulization BID Dahal, Marlowe Aschoff, MD   2.5 mg at 12/03/21 0923   albuterol (PROVENTIL) (2.5 MG/3ML) 0.083% nebulizer solution 3 mL  3 mL Inhalation Q6H PRN Dahal, Marlowe Aschoff, MD       cabergoline (DOSTINEX) tablet 0.25 mg  0.25 mg Oral Once per day on Mon Thu Dahal, Marlowe Aschoff, MD       cyanocobalamin (VITAMIN B12) tablet 1,000 mcg  1,000 mcg Oral Daily Dahal, Binaya, MD       ferrous sulfate tablet 325 mg  325 mg Oral QODAY Dahal, Binaya, MD       fluticasone furoate-vilanterol (BREO ELLIPTA) 200-25 MCG/ACT 1 puff  1 puff Inhalation Daily Dahal, Binaya, MD       guaiFENesin (MUCINEX) 12 hr tablet 600 mg  600 mg Oral BID Dahal, Binaya, MD       hydrALAZINE (APRESOLINE) injection 10 mg  10 mg Intravenous Q6H PRN Dahal, Binaya, MD       levothyroxine (SYNTHROID) tablet 75 mcg  75 mcg Oral Q0600 Dahal, Marlowe Aschoff, MD   75 mcg at 12/03/21 0812   magnesium oxide (MAG-OX) tablet 200 mg  200 mg Oral Daily PRN Dahal, Marlowe Aschoff, MD       melatonin tablet 5 mg  5 mg Oral QHS PRN Dahal, Marlowe Aschoff, MD       [START ON 12/06/2021] pantoprazole (PROTONIX) injection 40 mg  40 mg Intravenous Q12H Tu, Ching T, DO       pantoprozole (PROTONIX) 80 mg /NS 100 mL infusion  8 mg/hr Intravenous Continuous Tu, Ching T, DO 10 mL/hr at 12/03/21 0816 8 mg/hr at 12/03/21 0816   sucralfate (CARAFATE) 1 GM/10ML suspension 1 g  1 g Oral TID WC & HS Dahal, Binaya, MD       tamsulosin (FLOMAX) capsule 0.4 mg  0.4 mg Oral QPC supper Dahal, Binaya, MD       zolpidem (AMBIEN) tablet 5  mg  5 mg Oral QHS PRN Dahal, Marlowe Aschoff, MD       Current Outpatient Medications  Medication Sig Dispense Refill   albuterol (VENTOLIN HFA) 108 (90 Base) MCG/ACT inhaler Inhale 2 puffs into the lungs every 6 (six) hours as needed for wheezing or shortness of breath. 8 g 5   Ascorbic Acid (VITAMIN C) 500 MG CAPS Take 1 capsule by mouth daily.     budesonide-formoterol (SYMBICORT) 160-4.5 MCG/ACT inhaler Inhale 2 puffs into the lungs in the morning and at bedtime. 10.2 g 6   cabergoline (DOSTINEX) 0.5 MG tablet Take 0.5 tablets (0.25 mg total) by mouth 2 (two) times a week. 12 tablet 1   calcium carbonate (OS-CAL - DOSED IN MG OF ELEMENTAL CALCIUM) 1250 (500 Ca) MG tablet Take 1 tablet by mouth daily with breakfast.     cholecalciferol (VITAMIN D) 1000 UNITS tablet Take 1,000 Units by mouth daily.      diclofenac Sodium (VOLTAREN) 1 % GEL Apply 2 g topically 4 (four) times daily. (Patient taking differently: Apply 2 g topically daily as needed (For pain).) 50 g 5   diphenhydrAMINE (BENADRYL) 25 MG tablet Take 25 mg by mouth every 6 (six) hours as needed for itching.     diphenhydramine-acetaminophen (TYLENOL PM) 25-500 MG TABS tablet Take 1/2 tablet by mouth at bedtime as needed for sleep     FEROSUL 325 (65 Fe) MG tablet TAKE (1) TABLET DAILY WITH BREAKFAST. (Patient taking differently: Take 325 mg by mouth every other day.) 90 tablet 3   GAVILAX 17 GM/SCOOP powder DISSOLVE 17 GM SCOOP IN 8 OZ. OF WATER AND DRINK ONCE DAILY AS NEEDED FOR CONSTIPATION (Patient taking differently: Take 17 g by mouth daily as needed for mild constipation.) 510 g 0   guaiFENesin (MUCINEX) 600 MG 12 hr tablet Take 1 tablet (600 mg total) by mouth 2 (two) times daily. 60 tablet 0   levothyroxine (SYNTHROID) 75 MCG tablet Take 1 tablet (75 mcg total) by mouth daily before breakfast. 90 tablet 1   magnesium oxide (MAG-OX) 400 (241.3 Mg) MG tablet Take 1 tablet (400 mg total) by mouth 2 (two) times daily. (Patient taking  differently: Take 200 mg by mouth daily as needed (for leg cramps).) 15 tablet 0   Melatonin 5 MG CAPS Take 1 capsule by mouth at bedtime.     naphazoline-pheniramine (NAPHCON-A) 0.025-0.3 % ophthalmic solution Place 1 drop into the left eye 2 (two) times daily as needed for irritation or allergies.     nitroGLYCERIN (NITROSTAT) 0.4 MG SL tablet Place 1 tablet (0.4 mg total) under the tongue every 5 (five) minutes as needed for chest pain. 25 tablet 2   pantoprazole (PROTONIX) 40 MG tablet Take 1 tablet (40 mg total) by mouth See admin instructions. Take '40mg'$  by mouth in the morning then take '40mg'$  every other night 60 tablet 3   psyllium (METAMUCIL) 28 % packet Take 1 packet by mouth daily. (Patient  taking differently: Take 1 packet by mouth daily as needed (for constipation).) 30 packet 1   sucralfate (CARAFATE) 1 GM/10ML suspension Take 1 g by mouth 4 (four) times daily -  with meals and at bedtime.     tamsulosin (FLOMAX) 0.4 MG CAPS capsule Take 1 capsule (0.4 mg total) by mouth daily after supper. 90 capsule 3   traMADol (ULTRAM) 50 MG tablet Take 1 tablet (50 mg total) by mouth 2 (two) times daily as needed. (Patient taking differently: Take 50 mg by mouth 2 (two) times daily as needed for moderate pain.) 30 tablet 2   triamcinolone cream (KENALOG) 0.1 % Apply 1 application topically 2 (two) times daily. (Patient taking differently: Apply 1 application  topically 2 (two) times daily as needed (itching).) 30 g 0   Turmeric 500 MG CAPS Take 1 capsule by mouth daily.     vitamin B-12 (CYANOCOBALAMIN) 1000 MCG tablet Take 1,000 mcg by mouth daily.     vitamin E 180 MG (400 UNITS) capsule Take 400 Units by mouth daily.     Respiratory Therapy Supplies (FLUTTER) DEVI Twice a day and prn as needed, may increase if feeling worse 1 each 0   sodium chloride HYPERTONIC 3 % nebulizer solution Take by nebulization as needed for other. (Patient not taking: Reported on 12/02/2021) 750 mL 12    Allergies as of  12/02/2021 - Review Complete 12/02/2021  Allergen Reaction Noted   Gadavist [gadobutrol] Hives 05/18/2018   Prednisone  03/10/2021   Betadine [povidone iodine] Other (See Comments) 05/28/2015   Lortab [hydrocodone-acetaminophen] Nausea And Vomiting 07/18/2011   Penicillins Other (See Comments)    Zetia [ezetimibe] Other (See Comments) 06/23/2010   Zocor [simvastatin] Nausea Only and Other (See Comments) 06/23/2010    Family History  Problem Relation Age of Onset   Heart disease Sister    Heart failure Father    Other Mother        phebitis related to Mainor's birth, he was 16 weeks old when she died   Colon cancer Neg Hx    Esophageal cancer Neg Hx    Rectal cancer Neg Hx    Stomach cancer Neg Hx    Colon polyps Neg Hx     Social History   Socioeconomic History   Marital status: Widowed    Spouse name: Not on file   Number of children: 2   Years of education: 14   Highest education level: Associate degree: occupational, Hotel manager, or vocational program  Occupational History   Occupation: retired  Tobacco Use   Smoking status: Never   Smokeless tobacco: Never  Vaping Use   Vaping Use: Never used  Substance and Sexual Activity   Alcohol use: No   Drug use: No   Sexual activity: Not Currently    Birth control/protection: None  Other Topics Concern   Not on file  Social History Narrative   Patient drinks caffeine a few times a week.   Patient is right handed.   Social Determinants of Health   Financial Resource Strain: Low Risk  (02/08/2021)   Overall Financial Resource Strain (CARDIA)    Difficulty of Paying Living Expenses: Not hard at all  Food Insecurity: No Food Insecurity (12/02/2021)   Hunger Vital Sign    Worried About Running Out of Food in the Last Year: Never true    Ran Out of Food in the Last Year: Never true  Transportation Needs: No Transportation Needs (12/02/2021)   PRAPARE - Transportation  Lack of Transportation (Medical): No    Lack of  Transportation (Non-Medical): No  Physical Activity: Insufficiently Active (02/08/2021)   Exercise Vital Sign    Days of Exercise per Week: 7 days    Minutes of Exercise per Session: 20 min  Stress: No Stress Concern Present (02/08/2021)   [place]    Feeling of Stress : Not at all  Social Connections: Moderately Integrated (02/08/2021)   Social Connection and Isolation Panel [NHANES]    Frequency of Communication with Friends and Family: More than three times a week    Frequency of Social Gatherings with Friends and Family: More than three times a week    Attends Religious Services: More than 4 times per year    Active Member of [employer] or Organizations: Yes    Attends Archivist Meetings: More than 4 times per year    Marital Status: Widowed  Intimate Partner Violence: Not At Risk (12/02/2021)   Humiliation, Afraid, Rape, and Kick questionnaire    Fear of Current or Ex-Partner: No    Emotionally Abused: No    Physically Abused: No    Sexually Abused: No    Review of Systems: ROS is O/W negative except as mentioned in HPI.  Physical Exam: Vital signs in last 24 hours: Temp:  [97.6 F (36.4 C)-98.3 F (36.8 C)] 98.3 F (36.8 C) (10/06 0813) Pulse Rate:  [62-92] 62 (10/06 0630) Resp:  [16-30] 20 (10/06 0630) BP: (126-159)/(55-86) 126/69 (10/06 0630) SpO2:  [93 %-99 %] 95 % (10/06 0630) Weight:  [68.9 kg] 68.9 kg (10/05 1553)   General:  Alert, Well-developed, well-nourished, pleasant and cooperative in NAD Head:  Normocephalic and atraumatic. Eyes:  Sclera clear, no icterus.  Conjunctiva pink. Ears:  Normal auditory acuity. Mouth:  No deformity or lesions.   Lungs:  Clear throughout to auscultation.  No wheezes, crackles, or rhonchi.  Heart:  Regular rate and rhythm; no murmurs, clicks, rubs, or gallops. Abdomen:  Soft, non-distended.  BS present.  Non-tender.  Rectal:  Deferred.  Was heme  positive in the ED. Msk:  Symmetrical without gross deformities. Pulses:  Normal pulses noted. Extremities:  Without clubbing or edema. Neurologic:  Alert and oriented x 4;  grossly normal neurologically. Skin:  Intact without significant lesions or rashes. Psych:  Alert and cooperative. Normal mood and affect.  Intake/Output from previous day: 10/05 0701 - 10/06 0700 In: 2600 [I.V.:100; Blood:2500] Out: -   Lab Results: Recent Labs    12/02/21 0948 12/02/21 1606 12/03/21 0546  WBC 10.3 9.8 11.6*  HGB 6.9* 6.9* 9.0*  HCT 21.8* 22.4* 28.3*  PLT 276 253 224   BMET Recent Labs    12/01/21 1039 12/02/21 1606 12/03/21 0546  NA 137 138 137  K 4.2 3.9 3.3*  CL 103 111 109  CO2 19* 23 22  GLUCOSE 109* 111* 84  BUN 27 26* 17  CREATININE 0.96 1.11 0.87  CALCIUM 8.7 8.3* 7.7*   LFT Recent Labs    12/02/21 1606  PROT 5.9*  ALBUMIN 3.4*  AST 22  ALT 15  ALKPHOS 56  BILITOT 0.5   Studies/Results: DG Chest Port 1 View  Result Date: 12/02/2021 CLINICAL DATA:  Weakness EXAM: PORTABLE CHEST 1 VIEW COMPARISON:  09/30/2021 FINDINGS: Single frontal view of the chest demonstrates a stable cardiac silhouette. Chronic elevation of the left hemidiaphragm. Stable areas of bibasilar scarring and bronchiectasis. No acute airspace disease, effusion, or pneumothorax. No acute  bony abnormalities. IMPRESSION: 1. Stable chest, no acute process. Electronically Signed   By: Randa Ngo M.D.   On: 12/02/2021 18:11    IMPRESSION:  *88 year old male sent in for anemia, hemoglobin of 6.9 g.  Reported small amount of dark stools earlier this week.  Had upper GI bleed in August with severe esophagitis with clot and esophageal ulcer and large hiatal hernia.  Received 2 units PRBCs and Hgb up to 9.0 grams from 6.9 grams.  Reports that he recently has only been taking his pantoprazole 40 mg in the morning and 40 mg every other night.  It looks like per our recent office note it said twice daily every  day, but the prescription that was sent said every other night.  He is still taking the Carafate as well. *Esophageal stricture. Not dilated at time of recent EGD due to upper GI bleed. No dysphagia with the exception of difficulty swallowing some medications.  PLAN: -Monitor hemoglobin and transfuse further if needed. -PPI drip. -EGD this afternoon.  NPO until then. -Continue Carafate suspension ACHS.   Laban Emperor. Zehr  12/03/2021, 8:56 AM   Attending Physician Note   I have taken a history, reviewed the chart and examined the patient. I performed a substantive portion of this encounter, including complete performance of at least one of the key components, in conjunction with the APP. I agree with the APP's note, impression and recommendations with my edits. My additional impressions and recommendations are as follows.   *Recurrent anemia, Hgb=6.9, melena. EGD in August 2023 showed severe esophagitis, an esophageal ulcer, a distal esophageal stricture and a large hiatal hernia. Colonoscopy in April 2022 showed left colon diverticulosis, one small tubular adenoma however the bowel prep was felt to be inadequate.   *Recommend: PPI IV infusion, Carafate suspension, transfusions to maintain Hgb > 7-8, EGD today.   Lucio Edward, MD [facility] See [facility], [facility], for our on call provider

## 2021-12-03 NOTE — Interval H&P Note (Signed)
History and Physical Interval Note:  12/03/2021 1:48 PM  [name]  has presented today for surgery, with the diagnosis of Anemia.  The various methods of treatment have been discussed with the patient and family. After consideration of risks, benefits and other options for treatment, the patient has consented to  Procedure(s): ESOPHAGOGASTRODUODENOSCOPY (EGD) WITH PROPOFOL (N/A) as a surgical intervention.  The patient's history has been reviewed, patient examined, no change in status, stable for surgery.  I have reviewed the patient's chart and labs.  Questions were answered to the patient's satisfaction.     Pricilla Riffle. [facility]

## 2021-12-03 NOTE — Consult Note (Addendum)
Referring Provider: EDP Primary Care Physician:  Dettinger, [name], MD Primary Gastroenterologist:  Dr. Hilarie Fredrickson  Reason for Consultation:  Anemia., UGI bleed  HPI: [name] is a 88 y.o. male with a past medical history significant for chronic constipation, bronchiectasis, A-fib not currently on anticoagulation, ascending aortic aneurysm, prolactinoma, history of GERD with severe esophagitis/esophageal ulcer, remote Barrett's esophagus, history of esophageal stricture status post dilatation, history of hiatal hernia repair with recurrence, colon polyps. See PMH /PSH for additional history.  He was seen by our service in August for upper GI bleed with EGD as follows.  EGD 10/02/2021:  - The examined portions of the nasopharynx, oropharynx and larynx were normal. - Clotted blood in the lower third of the esophagus. - Severe esophagitis with bleeding. Treated with argon plasma coagulation (APC). This esophagitis may be reflux or stasis in etiology - Esophageal ulcer with stigmata of recent bleeding. - Large hiatal hernia. - No specimens collected.  He followed up in our office in early September.  Plan was to consider repeat EGD to evaluate healing, but he was feeling better at that time and they elected not to proceed with that.  He was on pantoprazole 40 mg twice daily, but the newest prescription says once in the morning and every other night so that is what he has been doing.  Not sure what happened with that.  He has also been doing Carafate 4 times a day.  He was sent into the emergency department on this occasion for low hemoglobin as an outpatient, found to be 6.9 g.  He says that earlier this week he had a small amount of dark stools and "coughed up" a small amount of blood.  That seemed to clear up.  Here he was Hemoccult positive.  He has been transfused 2 units packed red blood cells and hemoglobin is increased to 9.0 g.  He is on PPI drip.  BUN is normal.   Past Medical  History:  Diagnosis Date   Abnormality of gait 06/07/2013   Anemia    Aneurysm ([place])    on assending aorta, currently watching it, Dr Cyndia Bent   Anxiety    Arthritis    Asthma    Atrial fibrillation ([place])    B12 deficiency    Barrett's esophagus    CAD (coronary artery disease) 03/2006   3.0 x 20 mm TAXUS Perseus DES to the LAD; 01/2007  L main 30%, oLAD 50%, pLAD stent ok, CFX 80%, OM 60%, pRCA 60%, mRCA 70%, oPDA 90%; med rx    Cancer ([place])    skin cancer    Cataract    bilateral removal of cateracts   COLONIC POLYPS 07/17/2008   Qualifier: Diagnosis of  By: Lovette Cliche, CNA, Christy     Complication of anesthesia     no issues,but pt prefers spinal due to Pulmonary problems   COPD (chronic obstructive pulmonary disease) ([place])    oxygen  on standby in home.   Depression    Diverticulosis    Emphysema    Enlarged prostate with urinary retention    Esophageal stenosis    Gastric ulcer    GERD (gastroesophageal reflux disease)    Hiatal hernia    Hypothyroidism    Neuropathy    Osteoporosis    Pituitary macroadenoma (HCC)    Restless legs syndrome (RLS)    Tubular adenoma of colon    UGIB (upper gastrointestinal bleed) 03/2013   EGD w/ large ulcer at GE junction  Past Surgical History:  Procedure Laterality Date   ABDOMINAL HERNIA REPAIR   2008   BIOPSY  06/13/2020   Procedure: BIOPSY;  Surgeon: Montez Morita, Quillian Quince, MD;  [facility];  Service: Gastroenterology;;  gastric   CARDIAC CATHETERIZATION  01/2007   L main 30%, oLAD 50%,  pLAD stent ok, CFX 80%, OM 60%, pRCA 60%, mRCA 70%, oPDA 90%; med rx   cataract extraction both eyes     COLONOSCOPY     COLONOSCOPY WITH PROPOFOL N/A 06/13/2020   Procedure: COLONOSCOPY WITH PROPOFOL;  Surgeon: Harvel Quale, MD;  [facility];  Service: Gastroenterology;  Laterality: N/A;   CORONARY ANGIOPLASTY     CORONARY STENT PLACEMENT  03/2006   3.0 x 20 mm TAXUS Perseus DES to the LAD   CYSTOSCOPY  N/A 06/10/2015   Procedure: CYSTOSCOPY FULGRATION OF BLEEDING,  electovapor resection;  Surgeon: Irine Seal, MD;  [facility]: [facility];  Service: Urology;  Laterality: N/A;   CYSTOSCOPY WITH INSERTION OF UROLIFT N/A 06/01/2015   Procedure: CYSTOSCOPY WITH INSERTION OF UROLIFT x4;  Surgeon: Irine Seal, MD;  [facility];  Service: Urology;  Laterality: N/A;   ESOPHAGOGASTRODUODENOSCOPY N/A 04/20/2013   Procedure: ESOPHAGOGASTRODUODENOSCOPY (EGD);  Surgeon: Inda Castle, MD;  [facility];  Service: Endoscopy;  Laterality: N/A;   ESOPHAGOGASTRODUODENOSCOPY N/A 03/25/2016   Procedure: ESOPHAGOGASTRODUODENOSCOPY (EGD);  Surgeon: Irene Shipper, MD;  Location: [facility];  Service: Endoscopy;  Laterality: N/A;   ESOPHAGOGASTRODUODENOSCOPY (EGD) WITH PROPOFOL N/A 10/13/2015   Procedure: ESOPHAGOGASTRODUODENOSCOPY (EGD) WITH PROPOFOL;  Surgeon: Jerene Bears, MD;  [facility];  Service: Gastroenterology;  Laterality: N/A;   ESOPHAGOGASTRODUODENOSCOPY (EGD) WITH PROPOFOL N/A 06/13/2020   Procedure: ESOPHAGOGASTRODUODENOSCOPY (EGD) WITH PROPOFOL;  Surgeon: Harvel Quale, MD;  [facility];  Service: Gastroenterology;  Laterality: N/A;   ESOPHAGOGASTRODUODENOSCOPY (EGD) WITH PROPOFOL N/A 10/02/2021   Procedure: ESOPHAGOGASTRODUODENOSCOPY (EGD) WITH PROPOFOL;  Surgeon: Daryel November, MD;  [facility];  Service: Gastroenterology;  Laterality: N/A;   FOOT SURGERY  1994 left, 2002 right foot   bilateral foot reconstruciton   FOOT SURGERY     reconstruction of both feet- no retained hardware.   HERNIA REPAIR     HIATAL HERNIA REPAIR  01-04-2008   HOT HEMOSTASIS N/A 10/02/2021   Procedure: HOT HEMOSTASIS (ARGON PLASMA COAGULATION/BICAP);  Surgeon: Daryel November, MD;  Location: [facility];  Service: Gastroenterology;  Laterality: N/A;   JOINT REPLACEMENT     MELANOMA EXCISION  2019   POLYPECTOMY     POLYPECTOMY  06/13/2020   Procedure: POLYPECTOMY;   Surgeon: Montez Morita, Quillian Quince, MD;  [facility];  Service: Gastroenterology;;  ascending,    PROSTATE SURGERY     x 2   SAVORY DILATION N/A 03/25/2016   Procedure: SAVORY DILATION;  Surgeon: Irene Shipper, MD;  [facility];  Service: Endoscopy;  Laterality: N/A;   TOTAL HIP ARTHROPLASTY  07/21/2011   Procedure: TOTAL HIP ARTHROPLASTY ANTERIOR APPROACH;  Surgeon: Mauri Pole, MD;  [facility];  Service: Orthopedics;  Laterality: Left;   TOTAL HIP ARTHROPLASTY Right 09/07/2012   Procedure: RIGHT TOTAL HIP ARTHROPLASTY ANTERIOR APPROACH;  Surgeon: Mcarthur Rossetti, MD;  [facility];  Service: Orthopedics;  Laterality: Right;   TRANSURETHRAL RESECTION OF BLADDER NECK N/A 11/04/2015   Procedure: RESECTION OF BLADDER NECK;  Surgeon: Cleon Gustin, MD;  [facility];  Service: Urology;  Laterality: N/A;   TRANSURETHRAL RESECTION  OF PROSTATE N/A 11/04/2015   Procedure: TRANSURETHRAL RESECTION OF THE PROSTATE (TURP); REMOVAL OF UROLIFT IMPLANTS X THREE;  Surgeon: Cleon Gustin, MD;  [facility];  Service: Urology;  Laterality: N/A;   UPPER GASTROINTESTINAL ENDOSCOPY  12/2013   Dr Hilarie Fredrickson, gastritis   VIDEO BRONCHOSCOPY Bilateral 02/01/2017   Procedure: VIDEO BRONCHOSCOPY WITHOUT FLUORO;  Surgeon: Tanda Rockers, MD;  [facility];  Service: Cardiopulmonary;  Laterality: Bilateral;    Prior to Admission medications   Medication Sig Start Date End Date Taking? Authorizing Provider  albuterol (VENTOLIN HFA) 108 (90 Base) MCG/ACT inhaler Inhale 2 puffs into the lungs every 6 (six) hours as needed for wheezing or shortness of breath. 07/14/21  Yes Dettinger, [name], MD  Ascorbic Acid (VITAMIN C) 500 MG CAPS Take 1 capsule by mouth daily.   Yes [provider]  budesonide-formoterol (SYMBICORT) 160-4.5 MCG/ACT inhaler Inhale 2 puffs into the lungs in the morning and at bedtime. 03/05/21  Yes Margaretha Seeds, MD  cabergoline  (DOSTINEX) 0.5 MG tablet Take 0.5 tablets (0.25 mg total) by mouth 2 (two) times a week. 09/23/21  Yes Nida, Marella Chimes, MD  calcium carbonate (OS-CAL - DOSED IN MG OF ELEMENTAL CALCIUM) 1250 (500 Ca) MG tablet Take 1 tablet by mouth daily with breakfast.   Yes [provider]  cholecalciferol (VITAMIN D) 1000 UNITS tablet Take 1,000 Units by mouth daily.    Yes [provider]  diclofenac Sodium (VOLTAREN) 1 % GEL Apply 2 g topically 4 (four) times daily. Patient taking differently: Apply 2 g topically daily as needed (For pain). 07/14/21  Yes Dettinger, [name], MD  diphenhydrAMINE (BENADRYL) 25 MG tablet Take 25 mg by mouth every 6 (six) hours as needed for itching.   Yes [provider]  diphenhydramine-acetaminophen (TYLENOL PM) 25-500 MG TABS tablet Take 1/2 tablet by mouth at bedtime as needed for sleep   Yes [provider]  FEROSUL 325 (65 Fe) MG tablet TAKE (1) TABLET DAILY WITH BREAKFAST. Patient taking differently: Take 325 mg by mouth every other day. 02/10/21  Yes Dettinger, [name], MD  GAVILAX 17 GM/SCOOP powder DISSOLVE 17 GM SCOOP IN 8 OZ. OF WATER AND DRINK ONCE DAILY AS NEEDED FOR CONSTIPATION Patient taking differently: Take 17 g by mouth daily as needed for mild constipation. 06/29/20  Yes Pyrtle, Lajuan Lines, MD  guaiFENesin (MUCINEX) 600 MG 12 hr tablet Take 1 tablet (600 mg total) by mouth 2 (two) times daily. 10/05/21  Yes Bonnielee Haff, MD  levothyroxine (SYNTHROID) 75 MCG tablet Take 1 tablet (75 mcg total) by mouth daily before breakfast. 12/01/21  Yes Dettinger, [name], MD  magnesium oxide (MAG-OX) 400 (241.3 Mg) MG tablet Take 1 tablet (400 mg total) by mouth 2 (two) times daily. Patient taking differently: Take 200 mg by mouth daily as needed (for leg cramps). 06/13/20  Yes Cristal Deer, MD  Melatonin 5 MG CAPS Take 1 capsule by mouth at bedtime.   Yes [provider]  naphazoline-pheniramine (NAPHCON-A) 0.025-0.3 %  ophthalmic solution Place 1 drop into the left eye 2 (two) times daily as needed for irritation or allergies.   Yes [provider]  nitroGLYCERIN (NITROSTAT) 0.4 MG SL tablet Place 1 tablet (0.4 mg total) under the tongue every 5 (five) minutes as needed for chest pain. 07/14/21  Yes Dettinger, [name], MD  pantoprazole (PROTONIX) 40 MG tablet Take 1 tablet (40 mg total) by mouth See admin instructions. Take '40mg'$  by  mouth in the morning then take '40mg'$  every other night 11/03/21  Yes [name], NP  psyllium (METAMUCIL) 28 % packet Take 1 packet by mouth daily. Patient taking differently: Take 1 packet by mouth daily as needed (for constipation). 06/07/21  Yes Gwenlyn Perking, FNP  sucralfate (CARAFATE) 1 GM/10ML suspension Take 1 g by mouth 4 (four) times daily -  with meals and at bedtime.   Yes [provider]  tamsulosin (FLOMAX) 0.4 MG CAPS capsule Take 1 capsule (0.4 mg total) by mouth daily after supper. 07/14/21  Yes Dettinger, [name], MD  traMADol (ULTRAM) 50 MG tablet Take 1 tablet (50 mg total) by mouth 2 (two) times daily as needed. Patient taking differently: Take 50 mg by mouth 2 (two) times daily as needed for moderate pain. 09/01/21  Yes Aundra Dubin, PA-C  triamcinolone cream (KENALOG) 0.1 % Apply 1 application topically 2 (two) times daily. Patient taking differently: Apply 1 application  topically 2 (two) times daily as needed (itching). 10/31/18  Yes Rakes, Connye Burkitt, FNP  Turmeric 500 MG CAPS Take 1 capsule by mouth daily.   Yes [provider]  vitamin B-12 (CYANOCOBALAMIN) 1000 MCG tablet Take 1,000 mcg by mouth daily.   Yes [provider]  vitamin E 180 MG (400 UNITS) capsule Take 400 Units by mouth daily.   Yes [provider]  Respiratory Therapy Supplies (FLUTTER) DEVI Twice a day and prn as needed, may increase if feeling worse 05/01/18   Martyn Ehrich, NP  sodium chloride HYPERTONIC 3 % nebulizer solution Take by  nebulization as needed for other. Patient not taking: Reported on 12/02/2021 06/18/20   Margaretha Seeds, MD    Current Facility-Administered Medications  Medication Dose Route Frequency Provider Last Rate Last Admin   acetaminophen (TYLENOL) tablet 650 mg  650 mg Oral Q6H PRN Dahal, Marlowe Aschoff, MD       Or   acetaminophen (TYLENOL) suppository 650 mg  650 mg Rectal Q6H PRN Dahal, Marlowe Aschoff, MD       albuterol (PROVENTIL) (2.5 MG/3ML) 0.083% nebulizer solution 2.5 mg  2.5 mg Nebulization BID Dahal, Marlowe Aschoff, MD   2.5 mg at 12/03/21 2505   albuterol (PROVENTIL) (2.5 MG/3ML) 0.083% nebulizer solution 3 mL  3 mL Inhalation Q6H PRN Dahal, Marlowe Aschoff, MD       cabergoline (DOSTINEX) tablet 0.25 mg  0.25 mg Oral Once per day on Mon Thu Dahal, Marlowe Aschoff, MD       cyanocobalamin (VITAMIN B12) tablet 1,000 mcg  1,000 mcg Oral Daily Dahal, Binaya, MD       ferrous sulfate tablet 325 mg  325 mg Oral QODAY Dahal, Binaya, MD       fluticasone furoate-vilanterol (BREO ELLIPTA) 200-25 MCG/ACT 1 puff  1 puff Inhalation Daily Dahal, Binaya, MD       guaiFENesin (MUCINEX) 12 hr tablet 600 mg  600 mg Oral BID Dahal, Binaya, MD       hydrALAZINE (APRESOLINE) injection 10 mg  10 mg Intravenous Q6H PRN Dahal, Binaya, MD       levothyroxine (SYNTHROID) tablet 75 mcg  75 mcg Oral Q0600 Dahal, Marlowe Aschoff, MD   75 mcg at 12/03/21 0812   magnesium oxide (MAG-OX) tablet 200 mg  200 mg Oral Daily PRN Dahal, Marlowe Aschoff, MD       melatonin tablet 5 mg  5 mg Oral QHS PRN Dahal, Marlowe Aschoff, MD       [START ON 12/06/2021] pantoprazole (PROTONIX) injection 40 mg  40 mg Intravenous Q12H Tu, Ching T, DO       pantoprozole (PROTONIX) 80 mg /NS 100 mL infusion  8 mg/hr Intravenous Continuous Tu, Ching T, DO 10 mL/hr at 12/03/21 0816 8 mg/hr at 12/03/21 0816   sucralfate (CARAFATE) 1 GM/10ML suspension 1 g  1 g Oral TID WC & HS Dahal, Binaya, MD       tamsulosin (FLOMAX) capsule 0.4 mg  0.4 mg Oral QPC supper Dahal, Binaya, MD       zolpidem (AMBIEN) tablet 5  mg  5 mg Oral QHS PRN Dahal, Marlowe Aschoff, MD       Current Outpatient Medications  Medication Sig Dispense Refill   albuterol (VENTOLIN HFA) 108 (90 Base) MCG/ACT inhaler Inhale 2 puffs into the lungs every 6 (six) hours as needed for wheezing or shortness of breath. 8 g 5   Ascorbic Acid (VITAMIN C) 500 MG CAPS Take 1 capsule by mouth daily.     budesonide-formoterol (SYMBICORT) 160-4.5 MCG/ACT inhaler Inhale 2 puffs into the lungs in the morning and at bedtime. 10.2 g 6   cabergoline (DOSTINEX) 0.5 MG tablet Take 0.5 tablets (0.25 mg total) by mouth 2 (two) times a week. 12 tablet 1   calcium carbonate (OS-CAL - DOSED IN MG OF ELEMENTAL CALCIUM) 1250 (500 Ca) MG tablet Take 1 tablet by mouth daily with breakfast.     cholecalciferol (VITAMIN D) 1000 UNITS tablet Take 1,000 Units by mouth daily.      diclofenac Sodium (VOLTAREN) 1 % GEL Apply 2 g topically 4 (four) times daily. (Patient taking differently: Apply 2 g topically daily as needed (For pain).) 50 g 5   diphenhydrAMINE (BENADRYL) 25 MG tablet Take 25 mg by mouth every 6 (six) hours as needed for itching.     diphenhydramine-acetaminophen (TYLENOL PM) 25-500 MG TABS tablet Take 1/2 tablet by mouth at bedtime as needed for sleep     FEROSUL 325 (65 Fe) MG tablet TAKE (1) TABLET DAILY WITH BREAKFAST. (Patient taking differently: Take 325 mg by mouth every other day.) 90 tablet 3   GAVILAX 17 GM/SCOOP powder DISSOLVE 17 GM SCOOP IN 8 OZ. OF WATER AND DRINK ONCE DAILY AS NEEDED FOR CONSTIPATION (Patient taking differently: Take 17 g by mouth daily as needed for mild constipation.) 510 g 0   guaiFENesin (MUCINEX) 600 MG 12 hr tablet Take 1 tablet (600 mg total) by mouth 2 (two) times daily. 60 tablet 0   levothyroxine (SYNTHROID) 75 MCG tablet Take 1 tablet (75 mcg total) by mouth daily before breakfast. 90 tablet 1   magnesium oxide (MAG-OX) 400 (241.3 Mg) MG tablet Take 1 tablet (400 mg total) by mouth 2 (two) times daily. (Patient taking  differently: Take 200 mg by mouth daily as needed (for leg cramps).) 15 tablet 0   Melatonin 5 MG CAPS Take 1 capsule by mouth at bedtime.     naphazoline-pheniramine (NAPHCON-A) 0.025-0.3 % ophthalmic solution Place 1 drop into the left eye 2 (two) times daily as needed for irritation or allergies.     nitroGLYCERIN (NITROSTAT) 0.4 MG SL tablet Place 1 tablet (0.4 mg total) under the tongue every 5 (five) minutes as needed for chest pain. 25 tablet 2   pantoprazole (PROTONIX) 40 MG tablet Take 1 tablet (40 mg total) by mouth See admin instructions. Take '40mg'$  by mouth in the morning then take '40mg'$  every other night 60 tablet 3   psyllium (METAMUCIL) 28 % packet Take 1 packet by mouth daily. (Patient  taking differently: Take 1 packet by mouth daily as needed (for constipation).) 30 packet 1   sucralfate (CARAFATE) 1 GM/10ML suspension Take 1 g by mouth 4 (four) times daily -  with meals and at bedtime.     tamsulosin (FLOMAX) 0.4 MG CAPS capsule Take 1 capsule (0.4 mg total) by mouth daily after supper. 90 capsule 3   traMADol (ULTRAM) 50 MG tablet Take 1 tablet (50 mg total) by mouth 2 (two) times daily as needed. (Patient taking differently: Take 50 mg by mouth 2 (two) times daily as needed for moderate pain.) 30 tablet 2   triamcinolone cream (KENALOG) 0.1 % Apply 1 application topically 2 (two) times daily. (Patient taking differently: Apply 1 application  topically 2 (two) times daily as needed (itching).) 30 g 0   Turmeric 500 MG CAPS Take 1 capsule by mouth daily.     vitamin B-12 (CYANOCOBALAMIN) 1000 MCG tablet Take 1,000 mcg by mouth daily.     vitamin E 180 MG (400 UNITS) capsule Take 400 Units by mouth daily.     Respiratory Therapy Supplies (FLUTTER) DEVI Twice a day and prn as needed, may increase if feeling worse 1 each 0   sodium chloride HYPERTONIC 3 % nebulizer solution Take by nebulization as needed for other. (Patient not taking: Reported on 12/02/2021) 750 mL 12    Allergies as of  12/02/2021 - Review Complete 12/02/2021  Allergen Reaction Noted   Gadavist [gadobutrol] Hives 05/18/2018   Prednisone  03/10/2021   Betadine [povidone iodine] Other (See Comments) 05/28/2015   Lortab [hydrocodone-acetaminophen] Nausea And Vomiting 07/18/2011   Penicillins Other (See Comments)    Zetia [ezetimibe] Other (See Comments) 06/23/2010   Zocor [simvastatin] Nausea Only and Other (See Comments) 06/23/2010    Family History  Problem Relation Age of Onset   Heart disease Sister    Heart failure Father    Other Mother        phebitis related to Aqib's birth, he was 52 weeks old when she died   Colon cancer Neg Hx    Esophageal cancer Neg Hx    Rectal cancer Neg Hx    Stomach cancer Neg Hx    Colon polyps Neg Hx     Social History   Socioeconomic History   Marital status: Widowed    Spouse name: Not on file   Number of children: 2   Years of education: 14   Highest education level: Associate degree: occupational, Hotel manager, or vocational program  Occupational History   Occupation: retired  Tobacco Use   Smoking status: Never   Smokeless tobacco: Never  Vaping Use   Vaping Use: Never used  Substance and Sexual Activity   Alcohol use: No   Drug use: No   Sexual activity: Not Currently    Birth control/protection: None  Other Topics Concern   Not on file  Social History Narrative   Patient drinks caffeine a few times a week.   Patient is right handed.   Social Determinants of Health   Financial Resource Strain: Low Risk  (02/08/2021)   Overall Financial Resource Strain (CARDIA)    Difficulty of Paying Living Expenses: Not hard at all  Food Insecurity: No Food Insecurity (12/02/2021)   Hunger Vital Sign    Worried About Running Out of Food in the Last Year: Never true    Ran Out of Food in the Last Year: Never true  Transportation Needs: No Transportation Needs (12/02/2021)   PRAPARE - Transportation  Lack of Transportation (Medical): No    Lack of  Transportation (Non-Medical): No  Physical Activity: Insufficiently Active (02/08/2021)   Exercise Vital Sign    Days of Exercise per Week: 7 days    Minutes of Exercise per Session: 20 min  Stress: No Stress Concern Present (02/08/2021)   [place]    Feeling of Stress : Not at all  Social Connections: Moderately Integrated (02/08/2021)   Social Connection and Isolation Panel [NHANES]    Frequency of Communication with Friends and Family: More than three times a week    Frequency of Social Gatherings with Friends and Family: More than three times a week    Attends Religious Services: More than 4 times per year    Active Member of [employer] or Organizations: Yes    Attends Archivist Meetings: More than 4 times per year    Marital Status: Widowed  Intimate Partner Violence: Not At Risk (12/02/2021)   Humiliation, Afraid, Rape, and Kick questionnaire    Fear of Current or Ex-Partner: No    Emotionally Abused: No    Physically Abused: No    Sexually Abused: No    Review of Systems: ROS is O/W negative except as mentioned in HPI.  Physical Exam: Vital signs in last 24 hours: Temp:  [97.6 F (36.4 C)-98.3 F (36.8 C)] 98.3 F (36.8 C) (10/06 0813) Pulse Rate:  [62-92] 62 (10/06 0630) Resp:  [16-30] 20 (10/06 0630) BP: (126-159)/(55-86) 126/69 (10/06 0630) SpO2:  [93 %-99 %] 95 % (10/06 0630) Weight:  [68.9 kg] 68.9 kg (10/05 1553)   General:  Alert, Well-developed, well-nourished, pleasant and cooperative in NAD Head:  Normocephalic and atraumatic. Eyes:  Sclera clear, no icterus.  Conjunctiva pink. Ears:  Normal auditory acuity. Mouth:  No deformity or lesions.   Lungs:  Clear throughout to auscultation.  No wheezes, crackles, or rhonchi.  Heart:  Regular rate and rhythm; no murmurs, clicks, rubs, or gallops. Abdomen:  Soft, non-distended.  BS present.  Non-tender.  Rectal:  Deferred.  Was heme  positive in the ED. Msk:  Symmetrical without gross deformities. Pulses:  Normal pulses noted. Extremities:  Without clubbing or edema. Neurologic:  Alert and oriented x 4;  grossly normal neurologically. Skin:  Intact without significant lesions or rashes. Psych:  Alert and cooperative. Normal mood and affect.  Intake/Output from previous day: 10/05 0701 - 10/06 0700 In: 2600 [I.V.:100; Blood:2500] Out: -   Lab Results: Recent Labs    12/02/21 0948 12/02/21 1606 12/03/21 0546  WBC 10.3 9.8 11.6*  HGB 6.9* 6.9* 9.0*  HCT 21.8* 22.4* 28.3*  PLT 276 253 224   BMET Recent Labs    12/01/21 1039 12/02/21 1606 12/03/21 0546  NA 137 138 137  K 4.2 3.9 3.3*  CL 103 111 109  CO2 19* 23 22  GLUCOSE 109* 111* 84  BUN 27 26* 17  CREATININE 0.96 1.11 0.87  CALCIUM 8.7 8.3* 7.7*   LFT Recent Labs    12/02/21 1606  PROT 5.9*  ALBUMIN 3.4*  AST 22  ALT 15  ALKPHOS 56  BILITOT 0.5   Studies/Results: DG Chest Port 1 View  Result Date: 12/02/2021 CLINICAL DATA:  Weakness EXAM: PORTABLE CHEST 1 VIEW COMPARISON:  09/30/2021 FINDINGS: Single frontal view of the chest demonstrates a stable cardiac silhouette. Chronic elevation of the left hemidiaphragm. Stable areas of bibasilar scarring and bronchiectasis. No acute airspace disease, effusion, or pneumothorax. No acute  bony abnormalities. IMPRESSION: 1. Stable chest, no acute process. Electronically Signed   By: Randa Ngo M.D.   On: 12/02/2021 18:11    IMPRESSION:  *88 year old male sent in for anemia, hemoglobin of 6.9 g.  Reported small amount of dark stools earlier this week.  Had upper GI bleed in August with severe esophagitis with clot and esophageal ulcer and large hiatal hernia.  Received 2 units PRBCs and Hgb up to 9.0 grams from 6.9 grams.  Reports that he recently has only been taking his pantoprazole 40 mg in the morning and 40 mg every other night.  It looks like per our recent office note it said twice daily every  day, but the prescription that was sent said every other night.  He is still taking the Carafate as well. *Esophageal stricture. Not dilated at time of recent EGD due to upper GI bleed. No dysphagia with the exception of difficulty swallowing some medications.  PLAN: -Monitor hemoglobin and transfuse further if needed. -PPI drip. -EGD this afternoon.  NPO until then. -Continue Carafate suspension ACHS.   Laban Emperor. Zehr  12/03/2021, 8:56 AM   Attending Physician Note   I have taken a history, reviewed the chart and examined the patient. I performed a substantive portion of this encounter, including complete performance of at least one of the key components, in conjunction with the APP. I agree with the APP's note, impression and recommendations with my edits. My additional impressions and recommendations are as follows.   *Recurrent anemia, Hgb=6.9, melena. EGD in August 2023 showed severe esophagitis, an esophageal ulcer, a distal esophageal stricture and a large hiatal hernia. Colonoscopy in April 2022 showed left colon diverticulosis, one small tubular adenoma however the bowel prep was felt to be inadequate.   *Recommend: PPI IV infusion, Carafate suspension, transfusions to maintain Hgb > 7-8, EGD today.   Lucio Edward, MD [facility], [facility], for our on call provider

## 2021-12-03 NOTE — Anesthesia Procedure Notes (Signed)
Procedure Name: MAC Date/Time: 12/03/2021 1:50 PM  Performed by: West Pugh, CRNAPre-anesthesia Checklist: Patient identified, Emergency Drugs available, Suction available, Patient being monitored and Timeout performed Patient Re-evaluated:Patient Re-evaluated prior to induction Oxygen Delivery Method: Simple face mask Preoxygenation: Pre-oxygenation with 100% oxygen Placement Confirmation: positive ETCO2 Dental Injury: Teeth and Oropharynx as per pre-operative assessment

## 2021-12-03 NOTE — Op Note (Signed)
[facility] Patient Name: [name] Procedure Date: 12/03/2021 MRN: [id number] Attending MD: Ladene Artist , MD Date of Birth: [birth year] CSN: [id number] Age: 88 Admit Type: Inpatient Procedure:                Upper GI endoscopy Indications:              Acute post hemorrhagic anemia, Melena Providers:                Pricilla Riffle. Fuller Plan, MD, Orvil Feil, RN, Doristine Johns, RN, William Dalton, Technician Referring MD:             [facility] Medicines:                Monitored Anesthesia Care Complications:            No immediate complications. Estimated Blood Loss:     Estimated blood loss: none. Procedure:                Pre-Anesthesia Assessment:                           - Prior to the procedure, a History and Physical                            was performed, and patient medications and                            allergies were reviewed. The patient's tolerance of                            previous anesthesia was also reviewed. The risks                            and benefits of the procedure and the sedation                            options and risks were discussed with the patient.                            All questions were answered, and informed consent                            was obtained. Prior Anticoagulants: The patient has                            taken no previous anticoagulant or antiplatelet                            agents. ASA Grade Assessment: III - A patient with                            severe systemic disease. After reviewing the risks  and benefits, the patient was deemed in                            satisfactory condition to undergo the procedure.                           After obtaining informed consent, the endoscope was                            passed under direct vision. Throughout the                            procedure, the patient's blood pressure, pulse, and                             oxygen saturations were monitored continuously. The                            [id number]-[id number] ([id number]) Olympus endoscope was introduced                            through the mouth, and advanced to the second part                            of duodenum. The upper GI endoscopy was                            accomplished without difficulty. The patient                            tolerated the procedure well. Scope In: Scope Out: Findings:      LA Grade C (one or more mucosal breaks continuous between tops of 2 or       more mucosal folds, less than 75% circumference) esophagitis with no       bleeding was found in the distal esophagus.      One benign-appearing, intrinsic moderate stenosis was found 37 cm from       the incisors. This stenosis measured 1 cm (inner diameter) x less than       one cm (in length). The stenosis was traversed.      The exam of the esophagus was otherwise normal.      A medium-sized hiatal hernia was present.      The exam of the stomach was otherwise normal.      The duodenal bulb and second portion of the duodenum were normal. Impression:               - LA Grade C reflux esophagitis with no bleeding.                           - Benign-appearing esophageal stenosis.                           - Medium-sized hiatal hernia.                           -  Normal duodenal bulb and second portion of the                            duodenum.                           - No specimens collected. Moderate Sedation:      Not Applicable - Patient had care per Anesthesia. Recommendation:           - Return patient to hospital ward for ongoing care.                           - Clear liquid diet today.                           - Follow antireflux measures                           - Continue present medications including                            pantoprazole 40 mg bid and Carafate suspension ac &                            hs.                           -  Perform a colonoscopy tomorrow to further                            evaluate.                           - Consider EGD with dilation as outpatient with Dr.                            Hilarie Fredrickson. Procedure Code(s):        --- Professional ---                           [phone], Esophagogastroduodenoscopy, flexible,                            transoral; diagnostic, including collection of                            specimen(s) by brushing or washing, when performed                            (separate procedure) Diagnosis Code(s):        --- Professional ---                           K21.00, Gastro-esophageal reflux disease with                            esophagitis, without bleeding  K22.2, Esophageal obstruction                           K44.9, Diaphragmatic hernia without obstruction or                            gangrene                           D62, Acute posthemorrhagic anemia                           K92.1, Melena (includes Hematochezia) CPT copyright 2019 American Medical Association. All rights reserved. The codes documented in this report are preliminary and upon coder review may  be revised to meet current compliance requirements. Ladene Artist, MD 12/03/2021 2:15:17 PM This report has been signed electronically. Number of Addenda: 0

## 2021-12-03 NOTE — Transfer of Care (Signed)
Immediate Anesthesia Transfer of Care Note  Patient: [name]  Procedure(s) Performed: ESOPHAGOGASTRODUODENOSCOPY (EGD) WITH PROPOFOL  Patient Location: PACU and Endoscopy Unit  Anesthesia Type:MAC  Level of Consciousness: drowsy and patient cooperative  Airway & Oxygen Therapy: Patient Spontanous Breathing and Patient connected to face mask oxygen  Post-op Assessment: Report given to RN and Post -op Vital signs reviewed and stable  Post vital signs: Reviewed and stable  Last Vitals:  Vitals Value Taken Time  BP 110/57 12/03/21 1411  Temp    Pulse 73 12/03/21 1412  Resp 31 12/03/21 1412  SpO2 98 % 12/03/21 1412  Vitals shown include unvalidated device data.  Last Pain:  Vitals:   12/03/21 1411  TempSrc:   PainSc: Asleep         Complications: No notable events documented.

## 2021-12-03 NOTE — Anesthesia Preprocedure Evaluation (Addendum)
Anesthesia Evaluation  Patient identified by MRN, date of birth, ID band Patient awake    Reviewed: Allergy & Precautions, NPO status , Patient's Chart, lab work & pertinent test results  Airway Mallampati: II  TM Distance: >3 FB Neck ROM: Full    Dental  (+) Teeth Intact, Dental Advisory Given   Pulmonary asthma , COPD,    breath sounds clear to auscultation       Cardiovascular + CAD, + Cardiac Stents and + Peripheral Vascular Disease  + dysrhythmias Atrial Fibrillation  Rhythm:Regular Rate:Normal   Echo: 1. Left ventricular ejection fraction, by estimation, is 60 to 65%. The  left ventricle has normal function. The left ventricle has no regional  wall motion abnormalities. Left ventricular diastolic parameters are  consistent with Grade II diastolic  dysfunction (pseudonormalization).  2. Right ventricular systolic function is normal. The right ventricular  size is normal. There is normal pulmonary artery systolic pressure. The  estimated right ventricular systolic pressure is 49.1 mmHg.  3. Left atrial size was mildly dilated.  4. Right atrial size was mildly dilated.  5. The mitral valve is normal in structure. Mild mitral valve  regurgitation. No evidence of mitral stenosis.  6. The aortic valve is tricuspid. Aortic valve regurgitation is trivial.  No aortic stenosis is present.  7. Aortic dilatation noted. There is mild dilatation of the aortic root,  measuring 38 mm.  8. The inferior vena cava is normal in size with greater than 50%  respiratory variability, suggesting right atrial pressure of 3 mmHg.    Neuro/Psych PSYCHIATRIC DISORDERS Anxiety Depression  Neuromuscular disease    GI/Hepatic Neg liver ROS, hiatal hernia, PUD, GERD  ,  Endo/Other  Hypothyroidism   Renal/GU negative Renal ROS     Musculoskeletal  (+) Arthritis ,   Abdominal Normal abdominal exam  (+)   Peds  Hematology negative  hematology ROS (+)   Anesthesia Other Findings   Reproductive/Obstetrics                            Anesthesia Physical Anesthesia Plan  ASA: 3  Anesthesia Plan: MAC   Post-op Pain Management:    Induction: Intravenous  PONV Risk Score and Plan: 0 and Propofol infusion  Airway Management Planned: Natural Airway and Simple Face Mask  Additional Equipment: None  Intra-op Plan:   Post-operative Plan:   Informed Consent: I have reviewed the patients History and Physical, chart, labs and discussed the procedure including the risks, benefits and alternatives for the proposed anesthesia with the patient or authorized representative who has indicated his/her understanding and acceptance.     Dental advisory given  Plan Discussed with: CRNA  Anesthesia Plan Comments:        Anesthesia Quick Evaluation

## 2021-12-03 NOTE — Anesthesia Postprocedure Evaluation (Signed)
Anesthesia Post Note  Patient: KAYVAN HOEFLING  Procedure(s) Performed: ESOPHAGOGASTRODUODENOSCOPY (EGD) WITH PROPOFOL     Patient location during evaluation: PACU Anesthesia Type: MAC Level of consciousness: awake and alert Pain management: pain level controlled Vital Signs Assessment: post-procedure vital signs reviewed and stable Respiratory status: spontaneous breathing, nonlabored ventilation, respiratory function stable and patient connected to nasal cannula oxygen Cardiovascular status: stable and blood pressure returned to baseline Postop Assessment: no apparent nausea or vomiting Anesthetic complications: no   No notable events documented.  Last Vitals:  Vitals:   12/03/21 1420 12/03/21 1430  BP: (!) 113/58 132/61  Pulse: 70 75  Resp: (!) 23 (!) 28  Temp:    SpO2: 94% 95%    Last Pain:  Vitals:   12/03/21 1430  TempSrc:   PainSc: 0-No pain                 Effie Berkshire

## 2021-12-04 ENCOUNTER — Observation Stay (HOSPITAL_COMMUNITY): Payer: Medicare Other | Admitting: Anesthesiology

## 2021-12-04 ENCOUNTER — Observation Stay (HOSPITAL_BASED_OUTPATIENT_CLINIC_OR_DEPARTMENT_OTHER): Payer: Medicare Other | Admitting: Anesthesiology

## 2021-12-04 ENCOUNTER — Encounter (HOSPITAL_COMMUNITY)
Admission: EM | Disposition: A | Discharge status: Home / Self Care | Payer: Self-pay | Source: Home / Self Care | Attending: Emergency Medicine

## 2021-12-04 DIAGNOSIS — K921 Melena: Secondary | ICD-10-CM

## 2021-12-04 DIAGNOSIS — K635 Polyp of colon: Secondary | ICD-10-CM

## 2021-12-04 DIAGNOSIS — I4891 Unspecified atrial fibrillation: Secondary | ICD-10-CM | POA: Diagnosis not present

## 2021-12-04 DIAGNOSIS — J449 Chronic obstructive pulmonary disease, unspecified: Secondary | ICD-10-CM

## 2021-12-04 DIAGNOSIS — D509 Iron deficiency anemia, unspecified: Secondary | ICD-10-CM

## 2021-12-04 DIAGNOSIS — K5730 Diverticulosis of large intestine without perforation or abscess without bleeding: Secondary | ICD-10-CM | POA: Diagnosis not present

## 2021-12-04 HISTORY — PX: POLYPECTOMY: SHX5525

## 2021-12-04 HISTORY — PX: BIOPSY: SHX5522

## 2021-12-04 HISTORY — PX: COLONOSCOPY WITH PROPOFOL: SHX5780

## 2021-12-04 LAB — CBC WITH DIFFERENTIAL/PLATELET
Abs Immature Granulocytes: 0.43 10*3/uL — ABNORMAL HIGH (ref 0.00–0.07)
Basophils Absolute: 0.1 10*3/uL (ref 0.0–0.1)
Basophils Relative: 1 %
Eosinophils Absolute: 0.6 10*3/uL — ABNORMAL HIGH (ref 0.0–0.5)
Eosinophils Relative: 6 %
HCT: 30.1 % — ABNORMAL LOW (ref 39.0–52.0)
Hemoglobin: 9.6 g/dL — ABNORMAL LOW (ref 13.0–17.0)
Immature Granulocytes: 5 %
Lymphocytes Relative: 8 %
Lymphs Abs: 0.7 10*3/uL (ref 0.7–4.0)
MCH: 28.1 pg (ref 26.0–34.0)
MCHC: 31.9 g/dL (ref 30.0–36.0)
MCV: 88 fL (ref 80.0–100.0)
Monocytes Absolute: 1.4 10*3/uL — ABNORMAL HIGH (ref 0.1–1.0)
Monocytes Relative: 15 %
Neutro Abs: 6 10*3/uL (ref 1.7–7.7)
Neutrophils Relative %: 65 %
Platelets: 283 10*3/uL (ref 150–400)
RBC: 3.42 MIL/uL — ABNORMAL LOW (ref 4.22–5.81)
RDW: 17.8 % — ABNORMAL HIGH (ref 11.5–15.5)
WBC: 9.3 10*3/uL (ref 4.0–10.5)
nRBC: 0 % (ref 0.0–0.2)

## 2021-12-04 LAB — BASIC METABOLIC PANEL
Anion gap: 5 (ref 5–15)
BUN: 11 mg/dL (ref 8–23)
CO2: 22 mmol/L (ref 22–32)
Calcium: 8 mg/dL — ABNORMAL LOW (ref 8.9–10.3)
Chloride: 112 mmol/L — ABNORMAL HIGH (ref 98–111)
Creatinine, Ser: 0.93 mg/dL (ref 0.61–1.24)
GFR, Estimated: >60 mL/min (ref >60)
Glucose, Bld: 92 mg/dL (ref 70–99)
Potassium: 3.4 mmol/L — ABNORMAL LOW (ref 3.5–5.1)
Sodium: 139 mmol/L (ref 135–145)

## 2021-12-04 LAB — POCT I-STAT, CHEM 8
BUN: 7 mg/dL — ABNORMAL LOW (ref 8–23)
Calcium, Ion: 1.05 mmol/L — ABNORMAL LOW (ref 1.15–1.40)
Chloride: 114 mmol/L — ABNORMAL HIGH (ref 98–111)
Creatinine, Ser: 0.7 mg/dL (ref 0.61–1.24)
Glucose, Bld: 78 mg/dL (ref 70–99)
HCT: 28 % — ABNORMAL LOW (ref 39.0–52.0)
Hemoglobin: 9.5 g/dL — ABNORMAL LOW (ref 13.0–17.0)
Potassium: 3.1 mmol/L — ABNORMAL LOW (ref 3.5–5.1)
Sodium: 145 mmol/L (ref 135–145)
TCO2: 18 mmol/L — ABNORMAL LOW (ref 22–32)

## 2021-12-04 SURGERY — COLONOSCOPY WITH PROPOFOL
Anesthesia: Monitor Anesthesia Care

## 2021-12-04 MED ORDER — LIDOCAINE 2% (20 MG/ML) 5 ML SYRINGE
INTRAMUSCULAR | Status: DC | PRN
  Administered 2021-12-04: 40 mg via INTRAVENOUS

## 2021-12-04 MED ORDER — PROPOFOL 500 MG/50ML IV EMUL
INTRAVENOUS | Status: AC
  Filled 2021-12-04: qty 50

## 2021-12-04 MED ORDER — PROPOFOL 10 MG/ML IV BOLUS
INTRAVENOUS | Status: DC | PRN
  Administered 2021-12-04: 40 mg via INTRAVENOUS
  Administered 2021-12-04 (×5): 20 mg via INTRAVENOUS
  Administered 2021-12-04: 40 mg via INTRAVENOUS
  Administered 2021-12-04 (×2): 20 mg via INTRAVENOUS
  Administered 2021-12-04: 30 mg via INTRAVENOUS
  Administered 2021-12-04: 10 mg via INTRAVENOUS
  Administered 2021-12-04: 40 mg via INTRAVENOUS

## 2021-12-04 MED ORDER — LACTATED RINGERS IV SOLN
INTRAVENOUS | Status: AC | PRN
  Administered 2021-12-04: 20 mL/h via INTRAVENOUS

## 2021-12-04 MED ORDER — POTASSIUM CHLORIDE 20 MEQ PO PACK
60.0000 meq | PACK | Freq: Once | ORAL | Status: AC
  Administered 2021-12-04: 60 meq via ORAL
  Filled 2021-12-04: qty 3

## 2021-12-04 MED ORDER — AMISULPRIDE (ANTIEMETIC) 5 MG/2ML IV SOLN: 10.0000 mg | Freq: Once | INTRAVENOUS | Status: DC | PRN

## 2021-12-04 MED ORDER — PHENYLEPHRINE 80 MCG/ML (10ML) SYRINGE FOR IV PUSH (FOR BLOOD PRESSURE SUPPORT)
PREFILLED_SYRINGE | INTRAVENOUS | Status: DC | PRN
  Administered 2021-12-04 (×2): 80 ug via INTRAVENOUS
  Administered 2021-12-04: 40 ug via INTRAVENOUS
  Administered 2021-12-04 (×2): 80 ug via INTRAVENOUS
  Administered 2021-12-04: 40 ug via INTRAVENOUS
  Administered 2021-12-04 (×6): 80 ug via INTRAVENOUS

## 2021-12-04 MED ORDER — ONDANSETRON HCL 4 MG/2ML IJ SOLN: 4.0000 mg | Freq: Once | INTRAMUSCULAR | Status: DC | PRN

## 2021-12-04 SURGICAL SUPPLY — 22 items

## 2021-12-04 NOTE — Progress Notes (Signed)
Mobility Specialist - Progress Note   12/04/21 1428  Mobility  Activity Ambulated with assistance in hallway  Activity Response Tolerated well  Distance Ambulated (ft) 600 ft  $Mobility charge 1 Mobility  Level of Assistance Modified independent, requires aide device or extra time  Assistive Device Front wheel walker  HOB Elevated/Bed Position Self regulated  Range of Motion/Exercises Active   Pt was found in bed and agreeable to ambulate. Had no complaints and at EOS returned to bed with all necessities in reach.  Ferd Hibbs Mobility Specialist

## 2021-12-04 NOTE — Op Note (Addendum)
[facility] Patient Name: [name] Procedure Date: 12/04/2021 MRN: [id number] Attending MD: Juanita Craver , MD Date of Birth: 04/18/1932 CSN: [id number] Age: 89 Admit Type: Inpatient Procedure:                Colonoscopy with cold biopsy x 1 & hot snare                            polypectomy x 1. Indications:              Rectal bleeding, Iron deficiency anemia, Personal                            history of colonic polyps-tubular adenoma;                            Colorectal cancer screening. Providers:                Juanita Craver, MD, Grace Isaac, RN, Benetta Spar,                            Technician, Annye Asa, MD, Luane School, CRNA Referring MD:             [name]. Dettinger, MD, Lajuan Lines. Pyrtle, MD Medicines:                Monitored Anesthesia Care Complications:            No immediate complications. Estimated Blood Loss:     Estimated blood loss: none. Procedure:                Pre-Anesthesia Assessment: - Prior to the                            procedure, a history and physical was performed,                            and patient medications and allergies were                            reviewed. The patient's tolerance of previous                            anesthesia was also reviewed. The risks and                            benefits of the procedure and the sedation options                            and risks were discussed with the patient. All                            questions were answered, and informed consent was                            obtained. Prior Anticoagulants: The patient has  taken no previous anticoagulant or antiplatelet                            agents. ASA Grade Assessment: III - A patient with                            severe systemic disease. After reviewing the risks                            and benefits, the patient was deemed in                            satisfactory condition  to undergo the procedure.                            After obtaining informed consent, the colonoscope                            was passed under direct vision. Throughout the                            procedure, the patient's blood pressure, pulse, and                            oxygen saturations were monitored continuously. The                            [id number]-HQ190L ([id number]) Olympus colonoscope was                            introduced through the anus and advanced to the the                            cecum, identified by appendiceal orifice and                            ileocecal valve. The colonoscopy was extremely                            difficult due to a redundant colon, significant                            looping and a tortuous colon. Successful completion                            of the procedure was aided by changing the patient                            to a supine position, applying abdominal pressure                            and lavage. The patient tolerated the procedure  well. The quality of the bowel preparation was                            fair, at best. The quality of the bowel preparation                            was evaluated using the BBPS [facility] Bowel                            Preparation Scale) with scores of: Right Colon = 2                            (minor amount of residual staining, small fragments                            of stool and/or opaque liquid, but mucosa seen                            well), Transverse Colon = 2 (minor amount of                            residual staining, small fragments of stool and/or                            opaque liquid, but mucosa seen well) and Left Colon                            = 2 (minor amount of residual staining, small                            fragments of stool and/or opaque liquid, but mucosa                            seen well). The total BBPS score equals 6. The                             bowel preparation used was NuLytely via split dose                            instruction. Scope In: 8:02:45 AM Scope Out: 8:52:19 AM Scope Withdrawal Time: 0 hours 17 minutes 54 seconds  Total Procedure Duration: 0 hours 49 minutes 34 seconds  Findings:      The perianal and digital rectal examinations were normal.      Multiple small and large-mouthed diverticula were found in the sigmoid       colon and descending colon.      A small sessile polyp was found in the hepatic flexure; this was removed       by cold biopsy x 1.      A 12 mm sessile polyp was found in the cecum; the polyp was removed with       a hot snare x 1-200/20; resection and retrieval were complete.      No additional abnormalities were found  on retroflexion.      A large amount of stool was found in the entire colon, precluding       visualization. Lavage of the area was performed using a large amount of       sterile water, resulting in incomplete clearance with fair visualization.      No fresh or old blood was noted on exam. Impression:               - Preparation of the colon was fair at best with                            aggressive lavage.                           - One small sessile polyp removed by cold biopsy x                            1 from the hepatic felxure.                           - One 12 mm sessile polyp in the cecum removed by                            hot snare-resected and retrieved.                           - Scattered diverticulosis in the sigmopid and                            descending colon. Moderate Sedation:      MAC used. Recommendation:           - Clear liquid diet today; advance as tolerated.                           - Continue present medications; avoid the use of                            all NSAIDS for 2 weeks.                           - Await pathology results.                           - Repeat colonoscopy date to be determined after                             pending pathology results are reviewed for                            surveillance.                           - Return to GI office in 4 weeks after discharge. Procedure Code(s):        --- Professional ---  45385, Colonoscopy, flexible; with removal of                            tumor(s), polyp(s), or other lesion(s) by snare                            technique Diagnosis Code(s):        --- Professional ---                           K62.5, Hemorrhage of anus and rectum                           D50.9, Iron deficiency anemia, unspecified                           Z86.010, Personal history of colonic polyps                           Z12.11, Encounter for screening for malignant                            neoplasm of colon                           K57.30, Diverticulosis of large intestine without                            perforation or abscess without bleeding CPT copyright 2019 American Medical Association. All rights reserved. The codes documented in this report are preliminary and upon coder review may  be revised to meet current compliance requirements. Juanita Craver, MD Juanita Craver, MD 12/04/2021 9:13:01 AM This report has been signed electronically. Number of Addenda: 0

## 2021-12-04 NOTE — Anesthesia Postprocedure Evaluation (Signed)
Anesthesia Post Note  Patient: [name]  Procedure(s) Performed: COLONOSCOPY WITH PROPOFOL BIOPSY POLYPECTOMY     Patient location during evaluation: PACU Anesthesia Type: MAC Level of consciousness: awake and alert, patient cooperative and oriented Pain management: pain level controlled Vital Signs Assessment: post-procedure vital signs reviewed and stable Respiratory status: spontaneous breathing, nonlabored ventilation and respiratory function stable Cardiovascular status: stable and blood pressure returned to baseline Postop Assessment: no apparent nausea or vomiting Anesthetic complications: no   No notable events documented.  Last Vitals:  Vitals:   12/04/21 0900 12/04/21 0915  BP: (!) 102/57 (!) 109/51  Pulse: 62 (!) 55  Resp: 20 20  Temp: 36.4 C   SpO2: 100% 100%    Last Pain:  Vitals:   12/04/21 0915  TempSrc:   PainSc: 0-No pain                 Zyeir Dymek,E. Mikele Sifuentes

## 2021-12-04 NOTE — Transfer of Care (Signed)
Immediate Anesthesia Transfer of Care Note  Patient: [name]  Procedure(s) Performed: COLONOSCOPY WITH PROPOFOL BIOPSY POLYPECTOMY  Patient Location: PACU  Anesthesia Type:General  Level of Consciousness: awake and patient cooperative  Airway & Oxygen Therapy: Patient Spontanous Breathing and Patient connected to nasal cannula oxygen  Post-op Assessment: Report given to RN and Post -op Vital signs reviewed and stable  Post vital signs: Reviewed and stable  Last Vitals:  Vitals Value Taken Time  BP 97/50 12/04/21 0906  Temp 36.4 C 12/04/21 0900  Pulse 51 12/04/21 0907  Resp 16 12/04/21 0907  SpO2 100 % 12/04/21 0907  Vitals shown include unvalidated device data.  Last Pain:  Vitals:   12/04/21 0900  TempSrc:   PainSc: 0-No pain         Complications: No notable events documented.

## 2021-12-04 NOTE — Anesthesia Procedure Notes (Signed)
Procedure Name: MAC Date/Time: 12/04/2021 8:07 AM  Performed by: Renato Shin, CRNAPre-anesthesia Checklist: Patient identified, Emergency Drugs available, Suction available and Patient being monitored Patient Re-evaluated:Patient Re-evaluated prior to induction Oxygen Delivery Method: Nasal cannula Preoxygenation: Pre-oxygenation with 100% oxygen Induction Type: IV induction Placement Confirmation: positive ETCO2 and breath sounds checked- equal and bilateral Dental Injury: Teeth and Oropharynx as per pre-operative assessment

## 2021-12-04 NOTE — Progress Notes (Addendum)
PROGRESS NOTE  JAVAUN DIMPERIO  DOB: May 26, 1932  PCP: Worthy Rancher, MD ZOX:[id number]  DOA: 12/02/2021  LOS: 0 days  Hospital Day: 3  Brief narrative: [name] is a 88 y.o. male with PMH significant for Barrett's esophagus, gastric ulcer, upper GI bleeding from GE junction, chronic anemia, HTN, HLD, CAD/stent, A-fib, ascending aortic aneurysm, hypothyroidism, pituitary macroadenoma, anxiety/depression. Patient was sent to the ED today by his PCP because of low hemoglobin. Patient was recently hospitalized 8/3 to 8/8 for upper GI bleeding.  EGD on 8/5 showed severe esophagitis with bleeding and esophageal ulcer with symptoms of recent bleeding that was treated with APCs.   Patient states he has been compliant to Protonix and Carafate as recommended For the last several days, patient has been feeling progressive fatigue.  On Monday 10/2, patient had a small amount of hemoptysis/hematemesis.  He also had a near syncope that day. Blood work done at PCP yesterday 10/4 showed hemoglobin of 7.3 and repeat blood work today showed hemoglobin of 6.9 and hence patient was sent to ED.  In the ED, patient was afebrile, hemodynamically stable Labs showed WBC count 9.8, hemoglobin 6.9, platelet 253 FOBT positive 2 units of PRBC transfused GI called Patient was admitted to hospitalist service for further evaluation management   Subjective: Patient was seen and examined this morning.  Lying down in bed.  Not in distress.  No new symptoms. Underwent colonoscopy this morning.  Multiple small and large mouth diverticula were found in the sigmoid colon and descending colon.  1 small sessile polyp in the hepatic flexure and another 1 in cecum were found and removed with snare.  No source of active bleeding.  Assessment/Plan: Acute GI bleeding History of Barrett's esophagus, gastric ulcer,  Recent upper GI bleeding from severe esophagitis and esophageal ulcer s/p APC's - 8/5 Presented with  hemoptysis/hematemesis, dark stool and low hemoglobin FOBT positive in the ED. GI consult obtained. 10/6, noted to have LA grade C reflux esophagitis without bleeding, benign-appearing esophageal stenosis. 10/7, colonoscopy showed multiple small and large mouth diverticula in sigmoid and descending colon and 2 polyps-snared.  Currently on liquid diet.  If tolerates, advanced to solid tonight. Repeat hemoglobin in the morning. Continue Protonix 40 mg twice daily and Carafate suspension ACHS.  Tentative plan of EGD with dilatation as an outpatient.  Acute on chronic anemia Baseline hemoglobin close to 10.  Hemoglobin seems to be dropping down gradually in the readings from last few days probably from slow GI bleeding.  It was 6.9 on admission yesterday. 2 units PRBC transfused.  Hemoglobin improved to 9 today. Continue iron supplement Recent Labs    12/02/21 0948 12/02/21 1606 12/03/21 0546 12/04/21 0715 12/04/21 0742  HGB 6.9* 6.9* 9.0* 9.6* 9.5*  MCV 87 91.8 86.5 88.0  --    Hypokalemia Potassium low at 3.1 today.  Oral replacement given. Recent Labs  Lab 12/01/21 1039 12/02/21 1606 12/03/21 0546 12/04/21 0715 12/04/21 0742  K 4.2 3.9 3.3* 3.4* 3.1*    CAD/stent Ascending aortic aneurysm HLD Patient is no longer on aspirin because of recurrent GI bleeding. It seems patient is not on a statin or Zetia because of myopathy  A-fib Not on any AV nodal blocking agent.  Not in any anticoagulation.  COPD Stable respiratory status.  Continue bronchodilators.  Hypothyroidism Synthroid 75 mcg daily  Pituitary macroadenoma Cabergoline twice a week  BPH Flomax daily  Mobility Encourage ambulation  Goals of care - -  Code Status: Full  Code full code  Diet:  Diet Order             DIET SOFT Room service appropriate? Yes; Fluid consistency: Thin  Diet effective 1400                  DVT prophylaxis: SCDs SCDs Start: 12/02/21 1805   Antimicrobials:  None Fluid: None Consultants: GI called from ED Family Communication: Son at bedside  Status is: Observation  Continue in-hospital care because: Colonoscopy this morning.  Repeat hemoglobin in the morning.  If stable, plan to discharge home tomorrow Level of care: Telemetry   Dispo: The patient is from: Home              Anticipated d/c is to: Home hopefully tomorrow              Patient currently is not medically stable to d/c.   Difficult to place patient No     Infusions:   pantoprazole 8 mg/hr (12/04/21 0754)    Scheduled Meds:  albuterol  2.5 mg Nebulization BID   cabergoline  0.25 mg Oral Once per day on Mon Thu   cyanocobalamin  1,000 mcg Oral Daily   ferrous sulfate  325 mg Oral QODAY   fluticasone furoate-vilanterol  1 puff Inhalation Daily   guaiFENesin  600 mg Oral BID   levothyroxine  75 mcg Oral Q0600   [START ON 12/06/2021] pantoprazole  40 mg Intravenous Q12H   sucralfate  1 g Oral TID WC & HS   tamsulosin  0.4 mg Oral QPC supper    PRN meds: acetaminophen **OR** acetaminophen, albuterol, amisulpride, hydrALAZINE, magnesium oxide, melatonin, ondansetron (ZOFRAN) IV, zolpidem   Antimicrobials: Anti-infectives (From admission, onward)    None       Objective: Vitals:   12/04/21 0952 12/04/21 1256  BP: 113/63 116/71  Pulse: 63 69  Resp: 18 18  Temp: (!) 97.4 F (36.3 C) (!) 97.5 F (36.4 C)  SpO2: 96% 100%    Intake/Output Summary (Last 24 hours) at 12/04/2021 1338 Last data filed at 12/04/2021 0849 Gross per 24 hour  Intake 950 ml  Output --  Net 950 ml   Filed Weights   12/02/21 1553  Weight: 68.9 kg   Weight change:  Body mass index is 23.81 kg/m.   Physical Exam: General exam: Pleasant, elderly Caucasian male.  Not in physical distress Skin: No rashes, lesions or ulcers. HEENT: Atraumatic, normocephalic, no obvious bleeding Lungs: Clear to auscultation bilaterally CVS: Regular rate and rhythm, no murmur GI/Abd soft, mild  epigastric tenderness, nondistended, bowel sound present CNS: Alert, awake, oriented x3.  Slow to respond Psychiatry: Mood appropriate Extremities: No pedal edema, no calf tenderness  Data Review: I have personally reviewed the laboratory data and studies available.  F/u labs ordered Unresulted Labs (From admission, onward)     Start     Ordered   12/05/21 0500  CBC with Differential/Platelet  Tomorrow morning,   R        12/04/21 1337   12/05/21 8295  Basic metabolic panel  Tomorrow morning,   R        12/04/21 1337             Signed, Terrilee Croak, MD [facility] 12/04/2021

## 2021-12-05 DIAGNOSIS — K921 Melena: Secondary | ICD-10-CM | POA: Diagnosis not present

## 2021-12-05 DIAGNOSIS — D62 Acute posthemorrhagic anemia: Secondary | ICD-10-CM | POA: Diagnosis not present

## 2021-12-05 DIAGNOSIS — K449 Diaphragmatic hernia without obstruction or gangrene: Secondary | ICD-10-CM | POA: Diagnosis not present

## 2021-12-05 DIAGNOSIS — K222 Esophageal obstruction: Secondary | ICD-10-CM | POA: Diagnosis not present

## 2021-12-05 DIAGNOSIS — K2100 Gastro-esophageal reflux disease with esophagitis, without bleeding: Secondary | ICD-10-CM | POA: Diagnosis not present

## 2021-12-05 LAB — CBC WITH DIFFERENTIAL/PLATELET
Abs Immature Granulocytes: 0.5 10*3/uL — ABNORMAL HIGH (ref 0.00–0.07)
Basophils Absolute: 0.1 10*3/uL (ref 0.0–0.1)
Basophils Relative: 1 %
Eosinophils Absolute: 0.7 10*3/uL — ABNORMAL HIGH (ref 0.0–0.5)
Eosinophils Relative: 6 %
HCT: 28.3 % — ABNORMAL LOW (ref 39.0–52.0)
Hemoglobin: 8.8 g/dL — ABNORMAL LOW (ref 13.0–17.0)
Immature Granulocytes: 5 %
Lymphocytes Relative: 10 %
Lymphs Abs: 1.1 10*3/uL (ref 0.7–4.0)
MCH: 27.7 pg (ref 26.0–34.0)
MCHC: 31.1 g/dL (ref 30.0–36.0)
MCV: 89 fL (ref 80.0–100.0)
Monocytes Absolute: 1.5 10*3/uL — ABNORMAL HIGH (ref 0.1–1.0)
Monocytes Relative: 13 %
Neutro Abs: 7.2 10*3/uL (ref 1.7–7.7)
Neutrophils Relative %: 65 %
Platelets: 264 10*3/uL (ref 150–400)
RBC: 3.18 MIL/uL — ABNORMAL LOW (ref 4.22–5.81)
RDW: 17.5 % — ABNORMAL HIGH (ref 11.5–15.5)
WBC: 11.1 10*3/uL — ABNORMAL HIGH (ref 4.0–10.5)
nRBC: 0 % (ref 0.0–0.2)

## 2021-12-05 LAB — BASIC METABOLIC PANEL
Anion gap: 4 — ABNORMAL LOW (ref 5–15)
BUN: 12 mg/dL (ref 8–23)
CO2: 23 mmol/L (ref 22–32)
Calcium: 7.8 mg/dL — ABNORMAL LOW (ref 8.9–10.3)
Chloride: 111 mmol/L (ref 98–111)
Creatinine, Ser: 0.84 mg/dL (ref 0.61–1.24)
GFR, Estimated: >60 mL/min (ref >60)
Glucose, Bld: 83 mg/dL (ref 70–99)
Potassium: 3.6 mmol/L (ref 3.5–5.1)
Sodium: 138 mmol/L (ref 135–145)

## 2021-12-05 MED ORDER — METOPROLOL TARTRATE 5 MG/5ML IV SOLN
2.5000 mg | Freq: Once | INTRAVENOUS | Status: AC
  Administered 2021-12-05: 2.5 mg via INTRAVENOUS
  Filled 2021-12-05: qty 5

## 2021-12-05 MED ORDER — DILTIAZEM LOAD VIA INFUSION
10.0000 mg | Freq: Once | INTRAVENOUS | Status: DC
  Filled 2021-12-05: qty 10

## 2021-12-05 MED ORDER — DILTIAZEM HCL-DEXTROSE 125-5 MG/125ML-% IV SOLN (PREMIX)
5.0000 mg/h | INTRAVENOUS | Status: DC
  Administered 2021-12-05: 5 mg/h via INTRAVENOUS
  Filled 2021-12-05 (×2): qty 125

## 2021-12-05 MED ORDER — METOPROLOL TARTRATE 25 MG PO TABS
12.5000 mg | ORAL_TABLET | Freq: Two times a day (BID) | ORAL | Status: DC
  Administered 2021-12-06: 12.5 mg via ORAL
  Filled 2021-12-05 (×3): qty 1

## 2021-12-05 NOTE — Progress Notes (Signed)
PROGRESS NOTE FOR [name]  Subjective: No complaints.  Feeling well.  Objective: Vital signs in last 24 hours: Temp:  [97.5 F (36.4 C)-98 F (36.7 C)] 97.7 F (36.5 C) (10/08 1354) Pulse Rate:  [66-119] 119 (10/08 1354) Resp:  [18-20] 18 (10/08 1354) BP: (97-115)/(59-66) 97/65 (10/08 1354) SpO2:  [97 %-98 %] 97 % (10/08 1214) Last BM Date : 12/02/21  Intake/Output from previous day: 10/07 0701 - 10/08 0700 In: 940.6 [I.V.:940.6] Out: 525 [Urine:525] Intake/Output this shift: Total I/O In: 320 [P.O.:240; I.V.:80] Out: -   General appearance: alert and no distress GI: soft, non-tender; bowel sounds normal; no masses,  no organomegaly  Lab Results: Recent Labs    12/03/21 0546 12/04/21 0715 12/04/21 0742 12/05/21 0536  WBC 11.6* 9.3  --  11.1*  HGB 9.0* 9.6* 9.5* 8.8*  HCT 28.3* 30.1* 28.0* 28.3*  PLT 224 283  --  264   BMET Recent Labs    12/03/21 0546 12/04/21 0715 12/04/21 0742 12/05/21 0536  NA 137 139 145 138  K 3.3* 3.4* 3.1* 3.6  CL 109 112* 114* 111  CO2 22 22  --  23  GLUCOSE 84 92 78 83  BUN 17 11 7* 12  CREATININE 0.87 0.93 0.70 0.84  CALCIUM 7.7* 8.0*  --  7.8*   LFT Recent Labs    12/02/21 1606  PROT 5.9*  ALBUMIN 3.4*  AST 22  ALT 15  ALKPHOS 56  BILITOT 0.5   PT/INR No results for input(s): "LABPROT", "INR" in the last 72 hours. Hepatitis Panel No results for input(s): "HEPBSAG", "HCVAB", "HEPAIGM", "HEPBIGM" in the last 72 hours. C-Diff No results for input(s): "CDIFFTOX" in the last 72 hours. Fecal Lactopherrin No results for input(s): "FECLLACTOFRN" in the last 72 hours.  Studies/Results: No results found.  Medications: Scheduled:  albuterol  2.5 mg Nebulization BID   cabergoline  0.25 mg Oral Once per day on Mon Thu   cyanocobalamin  1,000 mcg Oral Daily   diltiazem  10 mg Intravenous Once   ferrous sulfate  325 mg Oral QODAY   fluticasone furoate-vilanterol  1 puff Inhalation Daily   guaiFENesin  600 mg Oral  BID   levothyroxine  75 mcg Oral Q0600   metoprolol tartrate  12.5 mg Oral BID   [START ON 12/06/2021] pantoprazole  40 mg Intravenous Q12H   sucralfate  1 g Oral TID WC & HS   tamsulosin  0.4 mg Oral QPC supper   Continuous:  diltiazem (CARDIZEM) infusion     pantoprazole Stopped (12/05/21 1119)    Assessment/Plan: 1) LA Grade C esophagitis. 2) Anemia.   The patient's HGB did decline mildly down to 8.8 g/dL from 9.5 g/dL.  He is stable and there are no reports of any gross bleeding.    Plan: 1) Continue to monitor HGB and transfuse as necessary. 2) Maintain pantoprazole and sucralfate. 3) [name] will resume care in the AM.  LOS: 0 days   [name] D 12/05/2021, 2:37 PM

## 2021-12-05 NOTE — Progress Notes (Signed)
PROGRESS NOTE  [name]  DOB: 1933/02/15  PCP: Worthy Rancher, MD ONG:[id number]  DOA: 12/02/2021  LOS: 0 days  Hospital Day: 4  Brief narrative: [name] is a 88 y.o. male with PMH significant for Barrett's esophagus, gastric ulcer, upper GI bleeding from GE junction, chronic anemia, HTN, HLD, CAD/stent, A-fib, ascending aortic aneurysm, hypothyroidism, pituitary macroadenoma, anxiety/depression. Patient was sent to the ED today by his PCP because of low hemoglobin. Patient was recently hospitalized 8/3 to 8/8 for upper GI bleeding.  EGD on 8/5 showed severe esophagitis with bleeding and esophageal ulcer with symptoms of recent bleeding that was treated with APCs.   Patient states he has been compliant to Protonix and Carafate as recommended For the last several days, patient has been feeling progressive fatigue.  On Monday 10/2, patient had a small amount of hemoptysis/hematemesis.  He also had a near syncope that day. Blood work done at PCP yesterday 10/4 showed hemoglobin of 7.3 and repeat blood work today showed hemoglobin of 6.9 and hence patient was sent to ED.  In the ED, patient was afebrile, hemodynamically stable Labs showed WBC count 9.8, hemoglobin 6.9, platelet 253 FOBT positive 2 units of PRBC transfused GI called Patient was admitted to hospitalist service for further evaluation management   Subjective: Patient was seen and examined this morning.  Propped up in bed.  Not in distress.  Hemoglobin slightly less than yesterday but no active bleeding.  Patient was set up for discharge today.  However later this morning, patient's heart rhythm flipped to A-fib with RVR up to 140s.  I examined him again.  No symptoms.  Assessment/Plan: #A-fib with RVR Patient states he has remote history of A-fib while he was sick in the hospital several years ago. This morning, patient's heart rhythm flipped to A-fib with RVR up to 140s.  I tried metoprolol 2.5 mg IV 1  dose without help.  Sustains in A-fib for at least 2 hours now.  Start on Cardizem bolus and drip.  Also started low-dose metoprolol 12.5 mg twice daily.  Continue to monitor in telemetry. CHADSVASC score elevated because of age, hypertension, hyperlipidemia.  However he is at high risk of bleeding due to recurrent GI bleeding.  Unable to start any antiplatelet or anticoagulant.  Discussed with patient and his son at bedside.  Acute GI bleeding History of Barrett's esophagus, gastric ulcer,  Recent upper GI bleeding from severe esophagitis and esophageal ulcer s/p APC's - 8/5 Presented with hemoptysis/hematemesis, dark stool and low hemoglobin FOBT positive in the ED. GI consult obtained. 10/6, noted to have LA grade C reflux esophagitis without bleeding, benign-appearing esophageal stenosis. 10/7, colonoscopy showed multiple small and large mouth diverticula in sigmoid and descending colon and 2 polyps-snared.  Currently able to tolerate solid diet. Continue Protonix 40 mg twice daily and Carafate suspension ACHS.  Tentative plan of EGD with dilatation as an outpatient.  Acute on chronic anemia Baseline hemoglobin close to 10.  Hemoglobin dropped lately because of GI bleeding.  Presented with hemoglobin of 6.9. 2 units PRBC transfused.  Hemoglobin 8.8 today which is slightly lower than yesterday.  No active bleeding.  Continue to monitor. Continue iron supplement Recent Labs    12/02/21 1606 12/03/21 0546 12/04/21 0715 12/04/21 0742 12/05/21 0536  HGB 6.9* 9.0* 9.6* 9.5* 8.8*  MCV 91.8 86.5 88.0  --  89.0   Hypokalemia Potassium level improved with replacement yesterday. Recent Labs  Lab 12/02/21 1606 12/03/21 0546 12/04/21 0715 12/04/21  5053 12/05/21 0536  K 3.9 3.3* 3.4* 3.1* 3.6   CAD/stent Ascending aortic aneurysm HLD Patient is no longer on aspirin because of recurrent GI bleeding. It seems patient is not on a statin or Zetia because of myopathy  COPD Stable  respiratory status.  Continue bronchodilators.  Hypothyroidism Synthroid 75 mcg daily  Pituitary macroadenoma Cabergoline twice a week  BPH Flomax daily  Mobility Encourage ambulation  Goals of care - -  Code Status: Full Code full code  Diet:  Diet Order             DIET SOFT Room service appropriate? Yes; Fluid consistency: Thin  Diet effective 1400                  DVT prophylaxis: SCDs SCDs Start: 12/02/21 1805   Antimicrobials: None Fluid: None Consultants: GI called from ED Family Communication: Son at bedside  Status is: Observation  Continue in-hospital care because: New A-fib with RVR.  Unable to discharge today  level of care: Telemetry   Dispo: The patient is from: Home              Anticipated d/c is to: Home hopefully tomorrow              Patient currently is not medically stable to d/c.   Difficult to place patient No     Infusions:   diltiazem (CARDIZEM) infusion     pantoprazole Stopped (12/05/21 1119)    Scheduled Meds:  albuterol  2.5 mg Nebulization BID   cabergoline  0.25 mg Oral Once per day on Mon Thu   cyanocobalamin  1,000 mcg Oral Daily   diltiazem  10 mg Intravenous Once   ferrous sulfate  325 mg Oral QODAY   fluticasone furoate-vilanterol  1 puff Inhalation Daily   guaiFENesin  600 mg Oral BID   levothyroxine  75 mcg Oral Q0600   metoprolol tartrate  12.5 mg Oral BID   [START ON 12/06/2021] pantoprazole  40 mg Intravenous Q12H   sucralfate  1 g Oral TID WC & HS   tamsulosin  0.4 mg Oral QPC supper    PRN meds: acetaminophen **OR** acetaminophen, albuterol, amisulpride, hydrALAZINE, magnesium oxide, melatonin, ondansetron (ZOFRAN) IV, zolpidem   Antimicrobials: Anti-infectives (From admission, onward)    None       Objective: Vitals:   12/05/21 0850 12/05/21 1214  BP:  97/64  Pulse: 78 79  Resp: 18 18  Temp:  98 F (36.7 C)  SpO2: 98% 97%    Intake/Output Summary (Last 24 hours) at 12/05/2021  1337 Last data filed at 12/05/2021 1311 Gross per 24 hour  Intake 560.58 ml  Output 525 ml  Net 35.58 ml   Filed Weights   12/02/21 1553  Weight: 68.9 kg   Weight change:  Body mass index is 23.81 kg/m.   Physical Exam: General exam: Pleasant, elderly Caucasian male.  Not in physical distress. Skin: No rashes, lesions or ulcers. HEENT: Atraumatic, normocephalic, no obvious bleeding Lungs: Clear to auscultation bilaterally CVS: Irregular rhythm, rapid heart rate, no murmur GI/Abd soft, mild epigastric tenderness, nondistended, bowel sound present CNS: Alert, awake, oriented x3.  Slow to respond Psychiatry: Mood appropriate Extremities: No pedal edema, no calf tenderness  Data Review: I have personally reviewed the laboratory data and studies available.  F/u labs ordered Unresulted Labs (From admission, onward)     Start     Ordered   12/06/21 0500  CBC with Differential/Platelet  Daily at  5am,   R      12/05/21 1335   12/06/21 2162  Basic metabolic panel  Daily at 5am,   R      12/05/21 1335           Signed, Terrilee Croak, MD [facility] 12/05/2021

## 2021-12-05 NOTE — Progress Notes (Signed)
Pt heart rhythm went into afib at 150's to 160's. Pt is asymptomatic. Vitals stable doctor notified and EKG completed

## 2021-12-06 ENCOUNTER — Other Ambulatory Visit: Payer: Self-pay | Admitting: Nurse Practitioner

## 2021-12-06 ENCOUNTER — Other Ambulatory Visit (HOSPITAL_COMMUNITY): Payer: Medicare Other

## 2021-12-06 ENCOUNTER — Encounter (HOSPITAL_COMMUNITY): Payer: Self-pay | Admitting: Gastroenterology

## 2021-12-06 DIAGNOSIS — K922 Gastrointestinal hemorrhage, unspecified: Secondary | ICD-10-CM

## 2021-12-06 DIAGNOSIS — K2101 Gastro-esophageal reflux disease with esophagitis, with bleeding: Secondary | ICD-10-CM

## 2021-12-06 DIAGNOSIS — K921 Melena: Secondary | ICD-10-CM | POA: Diagnosis not present

## 2021-12-06 LAB — BPAM RBC
Blood Product Expiration Date: 202311022359
Blood Product Expiration Date: 202311082359
ISSUE DATE / TIME: 202310052203
ISSUE DATE / TIME: 202310060007
Unit Type and Rh: 5100
Unit Type and Rh: 5100

## 2021-12-06 LAB — BASIC METABOLIC PANEL
Anion gap: 5 (ref 5–15)
BUN: 12 mg/dL (ref 8–23)
CO2: 22 mmol/L (ref 22–32)
Calcium: 8.1 mg/dL — ABNORMAL LOW (ref 8.9–10.3)
Chloride: 110 mmol/L (ref 98–111)
Creatinine, Ser: 1.05 mg/dL (ref 0.61–1.24)
GFR, Estimated: >60 mL/min (ref >60)
Glucose, Bld: 97 mg/dL (ref 70–99)
Potassium: 3.9 mmol/L (ref 3.5–5.1)
Sodium: 137 mmol/L (ref 135–145)

## 2021-12-06 LAB — CBC WITH DIFFERENTIAL/PLATELET
Abs Immature Granulocytes: 0.55 10*3/uL — ABNORMAL HIGH (ref 0.00–0.07)
Basophils Absolute: 0.1 10*3/uL (ref 0.0–0.1)
Basophils Relative: 1 %
Eosinophils Absolute: 0.5 10*3/uL (ref 0.0–0.5)
Eosinophils Relative: 4 %
HCT: 29.6 % — ABNORMAL LOW (ref 39.0–52.0)
Hemoglobin: 9.3 g/dL — ABNORMAL LOW (ref 13.0–17.0)
Immature Granulocytes: 5 %
Lymphocytes Relative: 10 %
Lymphs Abs: 1.1 10*3/uL (ref 0.7–4.0)
MCH: 27.6 pg (ref 26.0–34.0)
MCHC: 31.4 g/dL (ref 30.0–36.0)
MCV: 87.8 fL (ref 80.0–100.0)
Monocytes Absolute: 1.7 10*3/uL — ABNORMAL HIGH (ref 0.1–1.0)
Monocytes Relative: 15 %
Neutro Abs: 7.5 10*3/uL (ref 1.7–7.7)
Neutrophils Relative %: 65 %
Platelets: 298 10*3/uL (ref 150–400)
RBC: 3.37 MIL/uL — ABNORMAL LOW (ref 4.22–5.81)
RDW: 17.7 % — ABNORMAL HIGH (ref 11.5–15.5)
WBC: 11.5 10*3/uL — ABNORMAL HIGH (ref 4.0–10.5)
nRBC: 0 % (ref 0.0–0.2)

## 2021-12-06 LAB — TYPE AND SCREEN
ABO/RH(D): O POS
Antibody Screen: POSITIVE
Donor AG Type: NEGATIVE
Donor AG Type: NEGATIVE
Unit division: 0
Unit division: 0

## 2021-12-06 MED ORDER — LEVALBUTEROL TARTRATE 45 MCG/ACT IN AERO: 1.0000 | INHALATION_SPRAY | RESPIRATORY_TRACT | 2 refills | Status: AC | PRN

## 2021-12-06 MED ORDER — SUCRALFATE 1 G PO TABS: 1.0000 g | ORAL_TABLET | Freq: Three times a day (TID) | ORAL | 2 refills | Status: AC

## 2021-12-06 MED ORDER — METOPROLOL SUCCINATE ER 25 MG PO TB24: 25.0000 mg | ORAL_TABLET | Freq: Every day | ORAL | 2 refills | Status: DC

## 2021-12-06 MED ORDER — PANTOPRAZOLE SODIUM 40 MG PO TBEC: 40.0000 mg | DELAYED_RELEASE_TABLET | Freq: Two times a day (BID) | ORAL | Status: DC

## 2021-12-06 MED ORDER — LEVALBUTEROL HCL 0.63 MG/3ML IN NEBU: 0.6300 mg | INHALATION_SOLUTION | Freq: Four times a day (QID) | RESPIRATORY_TRACT | Status: DC | PRN

## 2021-12-06 MED ORDER — PANTOPRAZOLE SODIUM 40 MG PO TBEC: 40.0000 mg | DELAYED_RELEASE_TABLET | Freq: Two times a day (BID) | ORAL | 2 refills | Status: AC

## 2021-12-06 NOTE — Discharge Instructions (Signed)
Please go to [facility] office at [place], [place] [address] on  12/13/21 between the hours of 7:30 am and 4:00 pm to have labs drawn. Our lab is located in the basement of the building.

## 2021-12-06 NOTE — Discharge Summary (Signed)
Physician Discharge Summary  TASHI BAND GDJ:[id number] DOB: 1932/03/21 DOA: 12/02/2021  PCP: Dettinger, [name], MD  Admit date: 12/02/2021 Discharge date: 12/06/2021  Admitted From: Home Discharge disposition: home  Recommendations at discharge:  You have been started on metoprolol succinate 25 mg daily. Albuterol inhaler has been switched to Xopenex inhaler. Continue Protonix twice daily and Carafate suspension ACHS Follow-up with GI as an outpatient.  Brief narrative: [name] is a 88 y.o. male with PMH significant for Barrett's esophagus, gastric ulcer, upper GI bleeding from GE junction, chronic anemia, HTN, HLD, CAD/stent, A-fib, ascending aortic aneurysm, hypothyroidism, pituitary macroadenoma, anxiety/depression. Patient was sent to the ED today by his PCP because of low hemoglobin. Patient was recently hospitalized 8/3 to 8/8 for upper GI bleeding.  EGD on 8/5 showed severe esophagitis with bleeding and esophageal ulcer with symptoms of recent bleeding that was treated with APCs.   Patient states he has been compliant to Protonix and Carafate as recommended For the last several days, patient has been feeling progressive fatigue.  On Monday 10/2, patient had a small amount of hemoptysis/hematemesis.  He also had a near syncope that day. Blood work done at PCP yesterday 10/4 showed hemoglobin of 7.3 and repeat blood work today showed hemoglobin of 6.9 and hence patient was sent to ED.  In the ED, patient was afebrile, hemodynamically stable Labs showed WBC count 9.8, hemoglobin 6.9, platelet 253 FOBT positive 2 units of PRBC transfused GI called Patient was admitted to hospitalist service for further evaluation management   Subjective: Patient was seen and examined this morning.  Sitting up in chair.  Not in distress.  No family at bedside.   Telemetry reviewed.  Heart rhythm back to normal sinus since yesterday evening.  Hospital course: Acute GI  bleeding History of Barrett's esophagus, gastric ulcer,  Recent upper GI bleeding from severe esophagitis and esophageal ulcer s/p APC's - 8/5 Presented with hemoptysis/hematemesis, dark stool and low hemoglobin FOBT positive in the ED. GI consult obtained. 10/6, noted to have LA grade C reflux esophagitis without bleeding, benign-appearing esophageal stenosis. 10/7, colonoscopy showed multiple small and large mouth diverticula in sigmoid and descending colon and 2 polyps-snared.  Currently able to tolerate solid diet. Continue Protonix 40 mg twice daily and Carafate suspension ACHS.  Tentative plan of EGD with dilatation as an outpatient.  Follow-up with GI as an outpatient.  Acute on chronic anemia Baseline hemoglobin close to 10.  Hemoglobin dropped lately because of GI bleeding.  Presented with hemoglobin of 6.9.  Two units PRBC transfused.  Hemoglobin gradually improved.  Continue monitor as an outpatient.  Continue iron supplement. Recent Labs    12/03/21 0546 12/04/21 0715 12/04/21 0742 12/05/21 0536 12/06/21 0431  HGB 9.0* 9.6* 9.5* 8.8* 9.3*  MCV 86.5 88.0  --  89.0 87.8   A-fib with RVR Patient states he has remote history of A-fib while he was sick in the hospital several years ago. 10/8, patient was in A-fib with RVR which did not improve with IV and metoprolol.  He was started on Cardizem drip.  After few hours of Cardizem drip, patient's heart rhythm converted to normal sinus.  I stopped Cardizem drip this morning.  I have started him on metoprolol tartrate 12.5 mg twice daily.  We will switch to metoprolol succinate 25 mg daily at discharge.   He is on albuterol inhaler at home.  I have switched to Xopenex. Regarding anticoagulation, CHADSVASC score elevated because of age, hypertension, hyperlipidemia.  However he is at high risk of bleeding due to recurrent GI bleeding.  Unable to start any antiplatelet or anticoagulant.  Discussed with patient and his son at  bedside.  Hypokalemia Potassium level improved with replacement yesterday. Recent Labs  Lab 12/03/21 0546 12/04/21 0715 12/04/21 0742 12/05/21 0536 12/06/21 0431  K 3.3* 3.4* 3.1* 3.6 3.9   CAD/stent Ascending aortic aneurysm HLD Patient is no longer on aspirin because of recurrent GI bleeding. It seems patient is not on a statin or Zetia because of myopathy  COPD Stable respiratory status.  Continue bronchodilators.  Hypothyroidism Synthroid 75 mcg daily  Pituitary macroadenoma Cabergoline twice a week  BPH Flomax daily  Mobility Encourage ambulation    Goals of care - -  Code Status: Full Code full code  Diet:  Diet Order             Diet general           DIET SOFT Room service appropriate? Yes; Fluid consistency: Thin  Diet effective 1400                  DVT prophylaxis: SCDs SCDs Start: 12/02/21 1805   Antimicrobials: None Fluid: None Consultants: GI called from ED Family Communication: Son at bedside  Status is: Observation  Continue in-hospital care because: New A-fib with RVR.  Unable to discharge today  level of care: Telemetry   Dispo: The patient is from: Home              Anticipated d/c is to: Home hopefully tomorrow              Patient currently is not medically stable to d/c.   Difficult to place patient No     Infusions:     Scheduled Meds:  cabergoline  0.25 mg Oral Once per day on Mon Thu   cyanocobalamin  1,000 mcg Oral Daily   diltiazem  10 mg Intravenous Once   ferrous sulfate  325 mg Oral QODAY   fluticasone furoate-vilanterol  1 puff Inhalation Daily   guaiFENesin  600 mg Oral BID   levothyroxine  75 mcg Oral Q0600   metoprolol tartrate  12.5 mg Oral BID   pantoprazole  40 mg Oral BID   sucralfate  1 g Oral TID WC & HS   tamsulosin  0.4 mg Oral QPC supper    PRN meds: acetaminophen **OR** acetaminophen, amisulpride, hydrALAZINE, levalbuterol, magnesium oxide, melatonin, ondansetron (ZOFRAN) IV,  zolpidem   Antimicrobials: Anti-infectives (From admission, onward)    None       Objective: Vitals:   12/06/21 0727 12/06/21 0959  BP:  111/68  Pulse:  79  Resp:  (!) 22  Temp:    SpO2: 100% 100%    Intake/Output Summary (Last 24 hours) at 12/06/2021 1118 Last data filed at 12/06/2021 0300 Gross per 24 hour  Intake 659.7 ml  Output 775 ml  Net -115.3 ml   Filed Weights   12/02/21 1553  Weight: 68.9 kg   Weight change:  Body mass index is 23.81 kg/m.   Physical Exam: General exam: Pleasant, elderly Caucasian male.  Not in physical distress. Skin: No rashes, lesions or ulcers. HEENT: Atraumatic, normocephalic, no obvious bleeding Lungs: Clear to auscultation bilaterally CVS: Irregular rhythm, rapid heart rate, no murmur GI/Abd soft, mild epigastric tenderness, nondistended, bowel sound present CNS: Alert, awake, oriented x3.  Slow to respond Psychiatry: Mood appropriate Extremities: No pedal edema, no calf tenderness  Data Review: I have  personally reviewed the laboratory data and studies available.  F/u labs ordered Unresulted Labs (From admission, onward)     Start     Ordered   12/06/21 0500  CBC with Differential/Platelet  Daily at 5am,   R      12/05/21 1335   12/06/21 7209  Basic metabolic panel  Daily at 5am,   R      12/05/21 1335           Signed, Terrilee Croak, MD [facility] 12/06/2021

## 2021-12-06 NOTE — Progress Notes (Cosign Needed Addendum)
Daily Progress Note  Hospital Day: 5  Chief Complaint: GI bleed  Brief History 88 y.o. male with a past medical history significant for chronic constipation, bronchiectasis, A-fib not currently on anticoagulation, ascending aortic aneurysm, prolactinoma, history of GERD with severe esophagitis/esophageal ulcer, remote Barrett's esophagus, history of esophageal stricture status post dilatation, history of hiatal hernia repair with recurrence, colon polyps. See PMH /PSH for additional history. Seen in consultation by [place] on 10.6 for GI bleed with dark stools and "coughing' blood. Hgb was down, he was transfused.    Assessment / Plan   # GI bleed with melena and drop in hgb.  No active bleeding / source of bleeding on EGD or colonoscopy this admission but he obviously bled with decline in hgb from 10.2 >> 6.9.  Hgb improved to 9.6 post 2 u PRBC Sounds like he is going home today. Not sure he should be on oral iron since can turn stool black and make it hard to know if he recurrent bleeding  Labs at our office in 7-10 days.  Follow up appt made with me  # Esophagitis, LA Grade C. This is some improvement from the EGD on 8/5 showing severe esophagitis with clot.  Continue BID PPI, will change to PO Continue carafate, renal function is normal   # Colon polyps, recurrent.  Path pending  Subjective   No BM since colonoscopy. Feels okay  Objective   Endoscopic studies:   10/6 EGD  - LA Grade C reflux esophagitis with no bleeding. Medium size hiatal hernia. Normal duodenal bulb and 2nd portion of duodenum   10/7 colonoscopy  --Extremely difficult due to redundant colon, significant looping and tortuosity. Prep was only fair. Multiple small and large-mouthed diverticula were found in the sigmoid colon and descending colon. A small sessile polyp was found in the hepatic flexure; this was removed. A 12 mm sessile polyp was found in the cecum; the polyp was removed with . A large amount  of stool was found in the entire colon, precluding   visualization. Lavage of the area was performed using a large amount of sterile water, resulting in incomplete clearance with fair visualization. No fresh or old blood was noted on exam.   Imaging:  DG Chest Port 1 View  Result Date: 12/02/2021 CLINICAL DATA:  Weakness EXAM: PORTABLE CHEST 1 VIEW COMPARISON:  09/30/2021 FINDINGS: Single frontal view of the chest demonstrates a stable cardiac silhouette. Chronic elevation of the left hemidiaphragm. Stable areas of bibasilar scarring and bronchiectasis. No acute airspace disease, effusion, or pneumothorax. No acute bony abnormalities. IMPRESSION: 1. Stable chest, no acute process. Electronically Signed   By: Randa Ngo M.D.   On: 12/02/2021 18:11    Lab Results: Recent Labs    12/04/21 0715 12/04/21 0742 12/05/21 0536 12/06/21 0431  WBC 9.3  --  11.1* 11.5*  HGB 9.6* 9.5* 8.8* 9.3*  HCT 30.1* 28.0* 28.3* 29.6*  PLT 283  --  264 298   BMET Recent Labs    12/04/21 0715 12/04/21 0742 12/05/21 0536 12/06/21 0431  NA 139 145 138 137  K 3.4* 3.1* 3.6 3.9  CL 112* 114* 111 110  CO2 22  --  23 22  GLUCOSE 92 78 83 97  BUN 11 7* 12 12  CREATININE 0.93 0.70 0.84 1.05  CALCIUM 8.0*  --  7.8* 8.1*   LFT No results for input(s): "PROT", "ALBUMIN", "AST", "ALT", "ALKPHOS", "BILITOT", "BILIDIR", "IBILI" in the last 72 hours. PT/INR  No results for input(s): "LABPROT", "INR" in the last 72 hours.   Scheduled inpatient medications:   cabergoline  0.25 mg Oral Once per day on Mon Thu   cyanocobalamin  1,000 mcg Oral Daily   diltiazem  10 mg Intravenous Once   ferrous sulfate  325 mg Oral QODAY   fluticasone furoate-vilanterol  1 puff Inhalation Daily   guaiFENesin  600 mg Oral BID   levothyroxine  75 mcg Oral Q0600   metoprolol tartrate  12.5 mg Oral BID   pantoprazole  40 mg Intravenous Q12H   sucralfate  1 g Oral TID WC & HS   tamsulosin  0.4 mg Oral QPC supper   Continuous  inpatient infusions:  PRN inpatient medications: acetaminophen **OR** acetaminophen, amisulpride, hydrALAZINE, levalbuterol, magnesium oxide, melatonin, ondansetron (ZOFRAN) IV, zolpidem  Vital signs in last 24 hours: Temp:  [97.6 F (36.4 C)-98.6 F (37 C)] 97.6 F (36.4 C) (10/09 0600) Pulse Rate:  [71-119] 76 (10/09 0725) Resp:  [17-30] 17 (10/09 0725) BP: (87-116)/(52-82) 111/68 (10/09 0600) SpO2:  [95 %-100 %] 100 % (10/09 0727) Last BM Date : 12/02/21  Intake/Output Summary (Last 24 hours) at 12/06/2021 0959 Last data filed at 12/06/2021 0300 Gross per 24 hour  Intake 899.7 ml  Output 775 ml  Net 124.7 ml    Intake/Output from previous day: 10/08 0701 - 10/09 0700 In: 899.7 [P.O.:720; I.V.:179.7] Out: 775 [Urine:775] Intake/Output this shift: No intake/output data recorded.   Physical Exam:  General: Alert male in NAD Heart:  Regular rate  Pulmonary: Normal respiratory effort Abdomen: Soft, nondistended, nontender. Normal bowel sounds.  Neurologic: Alert and oriented Psych: Pleasant. Cooperative.    Principal Problem:   GI bleeding Active Problems:   Esophageal stricture     LOS: 0 days   Tye Savoy ,NP 12/06/2021, 9:59 AM

## 2021-12-07 LAB — SURGICAL PATHOLOGY

## 2021-12-17 ENCOUNTER — Ambulatory Visit (INDEPENDENT_AMBULATORY_CARE_PROVIDER_SITE_OTHER): Payer: Medicare Other

## 2021-12-17 ENCOUNTER — Encounter: Payer: Self-pay | Admitting: Family Medicine

## 2021-12-17 ENCOUNTER — Ambulatory Visit (INDEPENDENT_AMBULATORY_CARE_PROVIDER_SITE_OTHER): Payer: Medicare Other | Admitting: Family Medicine

## 2021-12-17 ENCOUNTER — Other Ambulatory Visit: Payer: Self-pay

## 2021-12-17 VITALS — BP 124/72 | HR 82 | Temp 97.2°F | Ht 67.0 in | Wt 156.0 lb

## 2021-12-17 DIAGNOSIS — I4891 Unspecified atrial fibrillation: Secondary | ICD-10-CM | POA: Diagnosis not present

## 2021-12-17 DIAGNOSIS — D62 Acute posthemorrhagic anemia: Secondary | ICD-10-CM

## 2021-12-17 DIAGNOSIS — E876 Hypokalemia: Secondary | ICD-10-CM | POA: Diagnosis not present

## 2021-12-17 DIAGNOSIS — M19012 Primary osteoarthritis, left shoulder: Secondary | ICD-10-CM

## 2021-12-17 DIAGNOSIS — M810 Age-related osteoporosis without current pathological fracture: Secondary | ICD-10-CM

## 2021-12-17 NOTE — Progress Notes (Signed)
BP 124/72   Pulse 82   Temp (!) 97.2 F (36.2 C)   Ht $R'5\' 7"'nN$  (1.702 m)   Wt 156 lb (70.8 kg)   SpO2 98%   BMI 24.43 kg/m    Subjective:   Patient ID: [name], male    DOB: 09-29-1932, 88 y.o.   MRN: [id number]  HPI: AN SCHNABEL is a 88 y.o. male presenting on 12/17/2021 for Hospitalization Follow-up   HPI Hospital follow-up for GI bleed Patient is coming in for hospital follow-up for GI bleed.  He was in our office before the hospital and we did CBCs and he trended down to where he needed blood transfusions.  His hemoglobin was 7.3 and then repeated at 6.9 on 12/01/2021.  He was seen in the ED and FOBT positive GI was consulted.  He was also found to be in A-fib with RVR.  He has not noticed any more bleeding and is chest pain or palpitations.  He does feel weak and unstable still and feels fatigued.  His energy is still down.  Relevant past medical, surgical, family and social history reviewed and updated as indicated. Interim medical history since our last visit reviewed. Allergies and medications reviewed and updated.  Review of Systems  Constitutional:  Positive for fatigue. Negative for chills and fever.  Respiratory:  Negative for shortness of breath and wheezing.   Cardiovascular:  Negative for chest pain and leg swelling.  Musculoskeletal:  Negative for back pain and gait problem.  Skin:  Negative for rash.  All other systems reviewed and are negative.   Per HPI unless specifically indicated above   Allergies as of 12/17/2021       Reactions   Gadavist [gadobutrol] Hives   Prednisone    Agitation and hallucinations   Betadine [povidone Iodine] Other (See Comments)   Blisters    Lortab [hydrocodone-acetaminophen] Nausea And Vomiting   Penicillins Other (See Comments)   Lightheadedness  Has patient had a PCN reaction causing immediate rash, facial/tongue/throat swelling, SOB or lightheadedness with hypotension: Yes Has patient had a PCN reaction  causing severe rash involving mucus membranes or skin necrosis: No Has patient had a PCN reaction that required hospitalization No Has patient had a PCN reaction occurring within the last 10 years: No If all of the above answers are "NO", then may proceed with Cephalosporin use.   Zetia [ezetimibe] Other (See Comments)   Muscle weakness    Zocor [simvastatin] Nausea Only, Other (See Comments)   Muscle weakness         Medication List        Accurate as of December 17, 2021  4:13 PM. If you have any questions, ask your nurse or doctor.          budesonide-formoterol 160-4.5 MCG/ACT inhaler Commonly known as: Symbicort Inhale 2 puffs into the lungs in the morning and at bedtime.   cabergoline 0.5 MG tablet Commonly known as: DOSTINEX Take 0.5 tablets (0.25 mg total) by mouth 2 (two) times a week.   calcium carbonate 1250 (500 Ca) MG tablet Commonly known as: OS-CAL - dosed in mg of elemental calcium Take 1 tablet by mouth daily with breakfast.   cholecalciferol 1000 units tablet Commonly known as: VITAMIN D Take 1,000 Units by mouth daily.   cyanocobalamin 1000 MCG tablet Commonly known as: VITAMIN B12 Take 1,000 mcg by mouth daily.   diclofenac Sodium 1 % Gel Commonly known as: VOLTAREN Apply 2 g topically 4 (four)  times daily. What changed:  when to take this reasons to take this   diphenhydrAMINE 25 MG tablet Commonly known as: BENADRYL Take 25 mg by mouth every 6 (six) hours as needed for itching.   diphenhydramine-acetaminophen 25-500 MG Tabs tablet Commonly known as: TYLENOL PM Take 1/2 tablet by mouth at bedtime as needed for sleep   FeroSul 325 (65 FE) MG tablet Generic drug: ferrous sulfate TAKE (1) TABLET DAILY WITH BREAKFAST. What changed: See the new instructions.   Flutter Devi Twice a day and prn as needed, may increase if feeling worse   GaviLAX 17 GM/SCOOP powder Generic drug: polyethylene glycol powder DISSOLVE 17 GM SCOOP IN 8 OZ. OF  WATER AND DRINK ONCE DAILY AS NEEDED FOR CONSTIPATION What changed: See the new instructions.   guaiFENesin 600 MG 12 hr tablet Commonly known as: Mucinex Take 1 tablet (600 mg total) by mouth 2 (two) times daily.   levalbuterol 45 MCG/ACT inhaler Commonly known as: XOPENEX HFA Inhale 1 puff into the lungs every 4 (four) hours as needed for wheezing.   levothyroxine 75 MCG tablet Commonly known as: SYNTHROID Take 1 tablet (75 mcg total) by mouth daily before breakfast.   magnesium oxide 400 (241.3 Mg) MG tablet Commonly known as: MAG-OX Take 1 tablet (400 mg total) by mouth 2 (two) times daily. What changed:  how much to take when to take this reasons to take this   Melatonin 5 MG Caps Take 1 capsule by mouth at bedtime.   Metamucil 28 % packet Generic drug: psyllium Take 1 packet by mouth daily. What changed:  when to take this reasons to take this   metoprolol succinate 25 MG 24 hr tablet Commonly known as: Toprol XL Take 1 tablet (25 mg total) by mouth daily.   naphazoline-pheniramine 0.025-0.3 % ophthalmic solution Commonly known as: NAPHCON-A Place 1 drop into the left eye 2 (two) times daily as needed for irritation or allergies.   nitroGLYCERIN 0.4 MG SL tablet Commonly known as: NITROSTAT Place 1 tablet (0.4 mg total) under the tongue every 5 (five) minutes as needed for chest pain.   pantoprazole 40 MG tablet Commonly known as: PROTONIX Take 1 tablet (40 mg total) by mouth 2 (two) times daily.   sodium chloride HYPERTONIC 3 % nebulizer solution Take by nebulization as needed for other.   sucralfate 1 g tablet Commonly known as: CARAFATE Take 1 tablet (1 g total) by mouth 4 (four) times daily -  with meals and at bedtime.   tamsulosin 0.4 MG Caps capsule Commonly known as: FLOMAX Take 1 capsule (0.4 mg total) by mouth daily after supper.   traMADol 50 MG tablet Commonly known as: ULTRAM Take 1 tablet (50 mg total) by mouth 2 (two) times daily as  needed. What changed: reasons to take this   triamcinolone cream 0.1 % Commonly known as: KENALOG Apply 1 application topically 2 (two) times daily. What changed:  when to take this reasons to take this   Turmeric 500 MG Caps Take 1 capsule by mouth daily.   Vitamin C 500 MG Caps Take 1 capsule by mouth daily.   vitamin E 180 MG (400 UNITS) capsule Take 400 Units by mouth daily.         Objective:   BP 124/72   Pulse 82   Temp (!) 97.2 F (36.2 C)   Ht $R'5\' 7"'En$  (1.702 m)   Wt 156 lb (70.8 kg)   SpO2 98%   BMI 24.43 kg/m  Wt Readings from Last 3 Encounters:  12/17/21 156 lb (70.8 kg)  12/02/21 152 lb (68.9 kg)  12/01/21 152 lb (68.9 kg)    Physical Exam Vitals and nursing note reviewed.  Constitutional:      General: He is not in acute distress.    Appearance: He is well-developed. He is not diaphoretic.  Eyes:     General: No scleral icterus.    Conjunctiva/sclera: Conjunctivae normal.  Neck:     Thyroid: No thyromegaly.  Cardiovascular:     Rate and Rhythm: Normal rate and regular rhythm.     Heart sounds: Normal heart sounds. No murmur heard. Pulmonary:     Effort: Pulmonary effort is normal. No respiratory distress.     Breath sounds: Normal breath sounds. No wheezing.  Musculoskeletal:        General: Normal range of motion.     Cervical back: Neck supple.  Lymphadenopathy:     Cervical: No cervical adenopathy.  Skin:    General: Skin is warm and dry.     Findings: No rash.  Neurological:     Mental Status: He is alert and oriented to person, place, and time.     Coordination: Coordination normal.  Psychiatric:        Behavior: Behavior normal.       Assessment & Plan:   Problem List Items Addressed This Visit       Cardiovascular and Mediastinum   Afib ([place])   Relevant Orders   CBC with Differential/Platelet   CMP14+EGFR   Other Visit Diagnoses     Acute blood loss anemia    -  Primary   Relevant Orders   CBC with  Differential/Platelet   CMP14+EGFR   Hypokalemia       Relevant Orders   CBC with Differential/Platelet   CMP14+EGFR       We will recheck anemia to see where he is doing.  He still feels very fatigued but has not noticed any more bleeding. Follow up plan: Return if symptoms worsen or fail to improve, for 1 to 2 months anemia recheck.  Counseling provided for all of the vaccine components Orders Placed This Encounter  Procedures   CBC with Differential/Platelet   [place] Raelan Burgoon, MD [facility] 12/17/2021, 4:13 PM

## 2021-12-18 LAB — CBC WITH DIFFERENTIAL/PLATELET
Basophils Absolute: 0 10*3/uL (ref 0.0–0.2)
Basos: 0 %
EOS (ABSOLUTE): 0.5 10*3/uL — ABNORMAL HIGH (ref 0.0–0.4)
Eos: 4 %
Hematocrit: 29.7 % — ABNORMAL LOW (ref 37.5–51.0)
Hemoglobin: 9.6 g/dL — ABNORMAL LOW (ref 13.0–17.7)
Lymphocytes Absolute: 0.5 10*3/uL — ABNORMAL LOW (ref 0.7–3.1)
Lymphs: 4 %
MCH: 28.6 pg (ref 26.6–33.0)
MCHC: 32.3 g/dL (ref 31.5–35.7)
MCV: 88 fL (ref 79–97)
Monocytes Absolute: 1.3 10*3/uL — ABNORMAL HIGH (ref 0.1–0.9)
Monocytes: 10 %
Neutrophils Absolute: 10.2 10*3/uL — ABNORMAL HIGH (ref 1.4–7.0)
Neutrophils: 81 %
Platelets: 335 10*3/uL (ref 150–450)
RBC: 3.36 x10E6/uL — ABNORMAL LOW (ref 4.14–5.80)
RDW: 16.8 % — ABNORMAL HIGH (ref 11.6–15.4)
WBC: 12.6 10*3/uL — ABNORMAL HIGH (ref 3.4–10.8)

## 2021-12-18 LAB — CMP14+EGFR
ALT: 14 IU/L (ref 0–44)
AST: 21 IU/L (ref 0–40)
Albumin/Globulin Ratio: 1.7 (ref 1.2–2.2)
Albumin: 3.9 g/dL (ref 3.7–4.7)
Alkaline Phosphatase: 95 IU/L (ref 44–121)
BUN/Creatinine Ratio: 18 (ref 10–24)
BUN: 19 mg/dL (ref 8–27)
Bilirubin Total: 0.7 mg/dL (ref 0.0–1.2)
CO2: 24 mmol/L (ref 20–29)
Calcium: 8.9 mg/dL (ref 8.6–10.2)
Chloride: 105 mmol/L (ref 96–106)
Creatinine, Ser: 1.05 mg/dL (ref 0.76–1.27)
Globulin, Total: 2.3 g/dL (ref 1.5–4.5)
Glucose: 84 mg/dL (ref 70–99)
Potassium: 4.6 mmol/L (ref 3.5–5.2)
Sodium: 140 mmol/L (ref 134–144)
Total Protein: 6.2 g/dL (ref 6.0–8.5)
eGFR: 69 mL/min/{1.73_m2} (ref >59)

## 2021-12-18 LAB — IMMATURE CELLS: Metamyelocytes: 1 % — ABNORMAL HIGH (ref 0–0)

## 2021-12-22 ENCOUNTER — Ambulatory Visit (INDEPENDENT_AMBULATORY_CARE_PROVIDER_SITE_OTHER): Payer: Medicare Other

## 2021-12-22 DIAGNOSIS — R1310 Dysphagia, unspecified: Secondary | ICD-10-CM

## 2021-12-22 DIAGNOSIS — I712 Thoracic aortic aneurysm, without rupture, unspecified: Secondary | ICD-10-CM

## 2021-12-22 DIAGNOSIS — K2210 Ulcer of esophagus without bleeding: Secondary | ICD-10-CM

## 2021-12-22 DIAGNOSIS — F419 Anxiety disorder, unspecified: Secondary | ICD-10-CM

## 2021-12-22 DIAGNOSIS — I951 Orthostatic hypotension: Secondary | ICD-10-CM

## 2021-12-22 DIAGNOSIS — J45909 Unspecified asthma, uncomplicated: Secondary | ICD-10-CM

## 2021-12-22 DIAGNOSIS — I700 Atherosclerosis of aorta: Secondary | ICD-10-CM

## 2021-12-22 DIAGNOSIS — J690 Pneumonitis due to inhalation of food and vomit: Secondary | ICD-10-CM

## 2021-12-22 DIAGNOSIS — I2510 Atherosclerotic heart disease of native coronary artery without angina pectoris: Secondary | ICD-10-CM

## 2021-12-22 DIAGNOSIS — J479 Bronchiectasis, uncomplicated: Secondary | ICD-10-CM

## 2021-12-22 DIAGNOSIS — I088 Other rheumatic multiple valve diseases: Secondary | ICD-10-CM

## 2021-12-22 DIAGNOSIS — I480 Paroxysmal atrial fibrillation: Secondary | ICD-10-CM

## 2021-12-22 DIAGNOSIS — D352 Benign neoplasm of pituitary gland: Secondary | ICD-10-CM | POA: Diagnosis not present

## 2021-12-22 DIAGNOSIS — K2100 Gastro-esophageal reflux disease with esophagitis, without bleeding: Secondary | ICD-10-CM | POA: Diagnosis not present

## 2021-12-22 DIAGNOSIS — I10 Essential (primary) hypertension: Secondary | ICD-10-CM

## 2021-12-22 DIAGNOSIS — K222 Esophageal obstruction: Secondary | ICD-10-CM | POA: Diagnosis not present

## 2021-12-22 DIAGNOSIS — D62 Acute posthemorrhagic anemia: Secondary | ICD-10-CM

## 2021-12-22 DIAGNOSIS — J439 Emphysema, unspecified: Secondary | ICD-10-CM

## 2021-12-22 DIAGNOSIS — M461 Sacroiliitis, not elsewhere classified: Secondary | ICD-10-CM

## 2021-12-22 DIAGNOSIS — D630 Anemia in neoplastic disease: Secondary | ICD-10-CM

## 2021-12-24 ENCOUNTER — Other Ambulatory Visit: Payer: Medicare Other

## 2021-12-24 ENCOUNTER — Ambulatory Visit: Payer: Medicare Other | Admitting: "Endocrinology

## 2021-12-25 LAB — TSH: TSH: 1.16 u[IU]/mL (ref 0.450–4.500)

## 2021-12-25 LAB — T4, FREE: Free T4: 1.48 ng/dL (ref 0.82–1.77)

## 2021-12-25 LAB — PROLACTIN: Prolactin: 158 ng/mL — ABNORMAL HIGH (ref 4.0–15.2)

## 2022-01-02 NOTE — Progress Notes (Unsigned)
Cardiology Office Note   Date:  01/05/2022   ID:  [name], DOB [birth year], MRN [id number]  PCP:  Dettinger, [name], MD  Cardiologist:   Minus Breeding, MD   Chief Complaint  Patient presents with   Atrial Fibrillation      History of Present Illness: [name] is a 88 y.o. male who presents for who presents for followup of his known coronary disease. He had a negative stress perfusion study in 2017.   He wore a Holter.  There were PVCs, non sustained V tach and atrial tach and PVCs/PACs.   Echo at that time was essentially unremarkable.   He has an enlarged aorta of 47 mm.     Since I last saw him he was in the hospital with GI bleeding.  I reviewed these records.  He had EGD treated with APC's.  He had transfusion.  He had atrial fibrillation with rapid rate and appears like it required Cardizem drip for management.  He was not continued on any anticoagulation.  He was not on aspirin because of this as well.  Since going home he has not had any new cardiovascular complaints.  He does not notice atrial fibrillation.  He has had no palpitations, presyncope or syncope.  He denies any chest pressure, neck or arm discomfort.  He has had no weight gain or edema.  His biggest issue is balance and he is working with physical therapy. He had an echocardiogram during the hospitalization with a well-preserved ejection fraction is mildly elevated pulmonary pressures.   Past Medical History:  Diagnosis Date   Abnormality of gait 06/07/2013   Anemia    Aneurysm ([place])    on assending aorta, currently watching it, Dr Cyndia Bent   Anxiety    Arthritis    Asthma    Atrial fibrillation ([place])    B12 deficiency    Barrett's esophagus    CAD (coronary artery disease) 03/2006   3.0 x 20 mm TAXUS Perseus DES to the LAD; 01/2007  L main 30%, oLAD 50%, pLAD stent ok, CFX 80%, OM 60%, pRCA 60%, mRCA 70%, oPDA 90%; med rx    Cancer ([place])    skin cancer    Cataract    bilateral removal of  cateracts   COLONIC POLYPS 07/17/2008   Qualifier: Diagnosis of  By: Lovette Cliche, CNA, Christy     Complication of anesthesia     no issues,but pt prefers spinal due to Pulmonary problems   COPD (chronic obstructive pulmonary disease) ([place])    oxygen  on standby in home.   Depression    Diverticulosis    Emphysema    Enlarged prostate with urinary retention    Esophageal stenosis    Gastric ulcer    GERD (gastroesophageal reflux disease)    Hiatal hernia    Hypothyroidism    Neuropathy    Osteoporosis    Pituitary macroadenoma ([facility])    Restless legs syndrome (RLS)    Tubular adenoma of colon    UGIB (upper gastrointestinal bleed) 03/2013   EGD w/ large ulcer at GE junction    Past Surgical History:  Procedure Laterality Date   ABDOMINAL HERNIA REPAIR   2008   BIOPSY  06/13/2020   Procedure: BIOPSY;  Surgeon: Harvel Quale, MD;  [facility];  Service: Gastroenterology;;  gastric   BIOPSY  12/04/2021   Procedure: BIOPSY;  Surgeon: Juanita Craver, MD;  [facility];  Service: Gastroenterology;;  CARDIAC CATHETERIZATION  01/2007   L main 30%, oLAD 50%,  pLAD stent ok, CFX 80%, OM 60%, pRCA 60%, mRCA 70%, oPDA 90%; med rx   cataract extraction both eyes     COLONOSCOPY     COLONOSCOPY WITH PROPOFOL N/A 06/13/2020   Procedure: COLONOSCOPY WITH PROPOFOL;  Surgeon: Harvel Quale, MD;  [facility];  Service: Gastroenterology;  Laterality: N/A;   COLONOSCOPY WITH PROPOFOL N/A 12/04/2021   Procedure: COLONOSCOPY WITH PROPOFOL;  Surgeon: Juanita Craver, MD;  [facility];  Service: Gastroenterology;  Laterality: N/A;   CORONARY ANGIOPLASTY     CORONARY STENT PLACEMENT  03/2006   3.0 x 20 mm TAXUS Perseus DES to the LAD   CYSTOSCOPY N/A 06/10/2015   Procedure: CYSTOSCOPY FULGRATION OF BLEEDING,  electovapor resection;  Surgeon: Irine Seal, MD;  [facility];  Service: Urology;  Laterality: N/A;   CYSTOSCOPY WITH INSERTION OF  UROLIFT N/A 06/01/2015   Procedure: CYSTOSCOPY WITH INSERTION OF UROLIFT x4;  Surgeon: Irine Seal, MD;  [facility];  Service: Urology;  Laterality: N/A;   ESOPHAGOGASTRODUODENOSCOPY N/A 04/20/2013   Procedure: ESOPHAGOGASTRODUODENOSCOPY (EGD);  Surgeon: Inda Castle, MD;  [facility];  Service: Endoscopy;  Laterality: N/A;   ESOPHAGOGASTRODUODENOSCOPY N/A 03/25/2016   Procedure: ESOPHAGOGASTRODUODENOSCOPY (EGD);  Surgeon: Irene Shipper, MD;  Location[facility];  Service: Endoscopy;  Laterality: N/A;   ESOPHAGOGASTRODUODENOSCOPY (EGD) WITH PROPOFOL N/A 10/13/2015   Procedure: ESOPHAGOGASTRODUODENOSCOPY (EGD) WITH PROPOFOL;  Surgeon: Jerene Bears, MD;  [facility];  Service: Gastroenterology;  Laterality: N/A;   ESOPHAGOGASTRODUODENOSCOPY (EGD) WITH PROPOFOL N/A 06/13/2020   Procedure: ESOPHAGOGASTRODUODENOSCOPY (EGD) WITH PROPOFOL;  Surgeon: Harvel Quale, MD;  [facility];  Service: Gastroenterology;  Laterality: N/A;   ESOPHAGOGASTRODUODENOSCOPY (EGD) WITH PROPOFOL N/A 10/02/2021   Procedure: ESOPHAGOGASTRODUODENOSCOPY (EGD) WITH PROPOFOL;  Surgeon: Daryel November, MD;  [facility];  Service: Gastroenterology;  Laterality: N/A;   ESOPHAGOGASTRODUODENOSCOPY (EGD) WITH PROPOFOL N/A 12/03/2021   Procedure: ESOPHAGOGASTRODUODENOSCOPY (EGD) WITH PROPOFOL;  Surgeon: Ladene Artist, MD;  [facility];  Service: Gastroenterology;  Laterality: N/A;   FOOT SURGERY  1994 left, 2002 right foot   bilateral foot reconstruciton   FOOT SURGERY     reconstruction of both feet- no retained hardware.   HERNIA REPAIR     HIATAL HERNIA REPAIR  01-04-2008   HOT HEMOSTASIS N/A 10/02/2021   Procedure: HOT HEMOSTASIS (ARGON PLASMA COAGULATION/BICAP);  Surgeon: Daryel November, MD;  [facility];  Service: Gastroenterology;  Laterality: N/A;   JOINT REPLACEMENT     MELANOMA EXCISION  2019   POLYPECTOMY     POLYPECTOMY  06/13/2020    Procedure: POLYPECTOMY;  Surgeon: Harvel Quale, MD;  [facility];  Service: Gastroenterology;;  ascending,    POLYPECTOMY  12/04/2021   Procedure: POLYPECTOMY;  Surgeon: Juanita Craver, MD;  [facility];  Service: Gastroenterology;;   PROSTATE SURGERY     x 2   SAVORY DILATION N/A 03/25/2016   Procedure: SAVORY DILATION;  Surgeon: Irene Shipper, MD;  [facility];  Service: Endoscopy;  Laterality: N/A;   TOTAL HIP ARTHROPLASTY  07/21/2011   Procedure: TOTAL HIP ARTHROPLASTY ANTERIOR APPROACH;  Surgeon: Mauri Pole, MD;  [facility];  Service: Orthopedics;  Laterality: Left;   TOTAL HIP ARTHROPLASTY Right 09/07/2012   Procedure: RIGHT TOTAL HIP ARTHROPLASTY ANTERIOR APPROACH;  Surgeon: Mcarthur Rossetti, MD;  [facility];  Service: Orthopedics;  Laterality: Right;   TRANSURETHRAL RESECTION OF BLADDER NECK N/A 11/04/2015   Procedure: RESECTION OF BLADDER NECK;  Surgeon: Cleon Gustin, MD;  [facility];  Service: Urology;  Laterality: N/A;   TRANSURETHRAL RESECTION OF PROSTATE N/A 11/04/2015   Procedure: TRANSURETHRAL RESECTION OF THE PROSTATE (TURP); REMOVAL OF UROLIFT IMPLANTS X THREE;  Surgeon: Cleon Gustin, MD;  [facility];  Service: Urology;  Laterality: N/A;   UPPER GASTROINTESTINAL ENDOSCOPY  12/2013   Dr Hilarie Fredrickson, gastritis   VIDEO BRONCHOSCOPY Bilateral 02/01/2017   Procedure: VIDEO BRONCHOSCOPY WITHOUT FLUORO;  Surgeon: Tanda Rockers, MD;  [facility];  Service: Cardiopulmonary;  Laterality: Bilateral;     Current Outpatient Medications  Medication Sig Dispense Refill   Ascorbic Acid (VITAMIN C) 500 MG CAPS Take 1 capsule by mouth daily.     budesonide-formoterol (SYMBICORT) 160-4.5 MCG/ACT inhaler Inhale 2 puffs into the lungs in the morning and at bedtime. 10.2 g 6   cabergoline (DOSTINEX) 0.5 MG tablet Take 0.5 tablets (0.25 mg total) by mouth 2 (two) times a week. 12 tablet 1   calcium  carbonate (OS-CAL - DOSED IN MG OF ELEMENTAL CALCIUM) 1250 (500 Ca) MG tablet Take 1 tablet by mouth daily with breakfast.     cholecalciferol (VITAMIN D) 1000 UNITS tablet Take 1,000 Units by mouth daily.      diclofenac Sodium (VOLTAREN) 1 % GEL Apply 2 g topically 4 (four) times daily. 50 g 5   diphenhydrAMINE (BENADRYL) 25 MG tablet Take 25 mg by mouth every 6 (six) hours as needed for itching.     diphenhydramine-acetaminophen (TYLENOL PM) 25-500 MG TABS tablet Take 1/2 tablet by mouth at bedtime as needed for sleep     FEROSUL 325 (65 Fe) MG tablet TAKE (1) TABLET DAILY WITH BREAKFAST. 90 tablet 3   GAVILAX 17 GM/SCOOP powder DISSOLVE 17 GM SCOOP IN 8 OZ. OF WATER AND DRINK ONCE DAILY AS NEEDED FOR CONSTIPATION 510 g 0   guaiFENesin (MUCINEX) 600 MG 12 hr tablet Take 1 tablet (600 mg total) by mouth 2 (two) times daily. 60 tablet 0   levalbuterol (XOPENEX HFA) 45 MCG/ACT inhaler Inhale 1 puff into the lungs every 4 (four) hours as needed for wheezing. 1 each 2   levothyroxine (SYNTHROID) 75 MCG tablet Take 1 tablet (75 mcg total) by mouth daily before breakfast. 90 tablet 1   magnesium oxide (MAG-OX) 400 (241.3 Mg) MG tablet Take 1 tablet (400 mg total) by mouth 2 (two) times daily. 15 tablet 0   Melatonin 5 MG CAPS Take 1 capsule by mouth at bedtime.     metoprolol succinate (TOPROL XL) 25 MG 24 hr tablet Take 1 tablet (25 mg total) by mouth daily. 30 tablet 2   naphazoline-pheniramine (NAPHCON-A) 0.025-0.3 % ophthalmic solution Place 1 drop into the left eye 2 (two) times daily as needed for irritation or allergies.     nitroGLYCERIN (NITROSTAT) 0.4 MG SL tablet Place 1 tablet (0.4 mg total) under the tongue every 5 (five) minutes as needed for chest pain. 25 tablet 2   pantoprazole (PROTONIX) 40 MG tablet Take 1 tablet (40 mg total) by mouth 2 (two) times daily. 60 tablet 2   psyllium (METAMUCIL) 28 % packet Take 1 packet by mouth daily. 30 packet 1   Respiratory Therapy Supplies (FLUTTER)  DEVI Twice a day and prn as needed, may increase if feeling worse 1 each 0   sodium chloride HYPERTONIC 3 % nebulizer solution Take by  nebulization as needed for other. 750 mL 12   sucralfate (CARAFATE) 1 g tablet Take 1 tablet (1 g total) by mouth 4 (four) times daily -  with meals and at bedtime. 120 tablet 2   tamsulosin (FLOMAX) 0.4 MG CAPS capsule Take 1 capsule (0.4 mg total) by mouth daily after supper. 90 capsule 3   traMADol (ULTRAM) 50 MG tablet Take 1 tablet (50 mg total) by mouth 2 (two) times daily as needed. 30 tablet 2   Turmeric 500 MG CAPS Take 1 capsule by mouth daily.     vitamin B-12 (CYANOCOBALAMIN) 1000 MCG tablet Take 1,000 mcg by mouth daily.     vitamin E 180 MG (400 UNITS) capsule Take 400 Units by mouth daily.     triamcinolone cream (KENALOG) 0.1 % Apply 1 application topically 2 (two) times daily. (Patient not taking: Reported on 01/05/2022) 30 g 0   No current facility-administered medications for this visit.    Allergies:   Gadavist [gadobutrol], Prednisone, Betadine [povidone iodine], Lortab [hydrocodone-acetaminophen], Penicillins, Zetia [ezetimibe], and Zocor [simvastatin]   ROS:  Please see the history of present illness.   Otherwise, review of systems are positive for none.   All other systems are reviewed and negative.    PHYSICAL EXAM: VS:  BP 128/72   Pulse 91   Ht '5\' 7"'$  (1.702 m)   Wt 155 lb (70.3 kg)   SpO2 97%   BMI 24.28 kg/m  , BMI Body mass index is 24.28 kg/m. GENERAL:  Well appearing but slightly frail NECK:  No jugular venous distention, waveform within normal limits, carotid upstroke brisk and symmetric, no bruits, no thyromegaly LUNGS:  Clear to auscultation bilaterally CHEST:  Unremarkable HEART:  PMI not displaced or sustained,S1 and S2 within normal limits, no S3,  clicks, no rubs, no murmurs, irregular ABD:  Flat, positive bowel sounds normal in frequency in pitch, no bruits, no rebound, no guarding, no midline pulsatile mass, no  hepatomegaly, no splenomegaly EXT:  2 plus pulses throughout, no edema, no cyanosis no clubbing   EKG:  EKG is not ordered today. The ekg ordered 12/05/2021  demonstrates atrial fibrillation, rate 144, axis within normal limits, incomplete right bundle branch block, premature atrial contraction.   Recent Labs: 10/05/2021: Magnesium 1.9 12/17/2021: ALT 14; BUN 19; Creatinine, Ser 1.05; Hemoglobin 9.6; Platelets 335; Potassium 4.6; Sodium 140 12/24/2021: TSH 1.160    Lipid Panel    Component Value Date/Time   CHOL 144 12/01/2021 1039   TRIG 98 12/01/2021 1039   TRIG 71 04/01/2015 0934   HDL 44 12/01/2021 1039   HDL 56 04/01/2015 0934   CHOLHDL 3.3 12/01/2021 1039   CHOLHDL 2.9 06/12/2015 0529   VLDL 9 06/12/2015 0529   LDLCALC 82 12/01/2021 1039   LDLCALC 121 (H) 08/29/2013 0909      Wt Readings from Last 3 Encounters:  01/05/22 155 lb (70.3 kg)  12/17/21 156 lb (70.8 kg)  12/02/21 152 lb (68.9 kg)      Other studies Reviewed: Additional studies/ records that were reviewed today include: Hospital records. Review of the above records demonstrates:  Please see elsewhere in the note.     ASSESSMENT AND PLAN:     CORONARY ARTERY DISEASE -   The patient has no new sypmtoms.  No further cardiovascular testing is indicated.  We will continue with aggressive risk reduction and meds as listed.   ATRIAL FIB - He is back in atrial fibrillation.  His rate is controlled.  We have chosen not to use anticoagulation because of his bleeding and fall risk.  I think he is too frail to consider Watchman but I cannot have this discussion in the future with him.   ASCENDING AORTIC ANEURYSM- This was 43 mm in April of last 2022.  He has not wanted surgery.  I will not plan further imaging.    DYSLIPIDEMIA:    LDL was 82 with an HDL of 44.  No change in therapy.    Current medicines are reviewed at length with the patient today.  The patient does not have concerns regarding  medicines.  The following changes have been made:  None  Labs/ tests ordered today include:   None   No orders of the defined types were placed in this encounter.    Disposition:   FU with me in 6 months.   Signed, Minus Breeding, MD  01/05/2022 3:35 PM    [facility]

## 2022-01-05 ENCOUNTER — Encounter: Payer: Self-pay | Admitting: Cardiology

## 2022-01-05 ENCOUNTER — Ambulatory Visit (INDEPENDENT_AMBULATORY_CARE_PROVIDER_SITE_OTHER): Payer: Medicare Other | Admitting: Cardiology

## 2022-01-05 VITALS — BP 128/72 | HR 91 | Ht 67.0 in | Wt 155.0 lb

## 2022-01-05 DIAGNOSIS — E785 Hyperlipidemia, unspecified: Secondary | ICD-10-CM

## 2022-01-05 DIAGNOSIS — I2510 Atherosclerotic heart disease of native coronary artery without angina pectoris: Secondary | ICD-10-CM | POA: Diagnosis not present

## 2022-01-05 DIAGNOSIS — I480 Paroxysmal atrial fibrillation: Secondary | ICD-10-CM

## 2022-01-05 NOTE — Patient Instructions (Signed)
Medication Instructions:  The current medical regimen is effective;  continue present plan and medications.  *If you need a refill on your cardiac medications before your next appointment, please call your pharmacy*  Follow-Up: At [facility], you and your health needs are our priority.  As part of our continuing mission to provide you with exceptional heart care, we have created designated Provider Care Teams.  These Care Teams include your primary Cardiologist (physician) and Advanced Practice Providers (APPs -  Physician Assistants and Nurse Practitioners) who all work together to provide you with the care you need, when you need it.  We recommend signing up for the patient portal called "MyChart".  Sign up information is provided on this After Visit Summary.  MyChart is used to connect with patients for Virtual Visits (Telemedicine).  Patients are able to view lab/test results, encounter notes, upcoming appointments, etc.  Non-urgent messages can be sent to your provider as well.   To learn more about what you can do with MyChart, go to [email or web address].    Your next appointment:   6 month(s)  The format for your next appointment:   In Person  Provider:   James Hochrein, MD     Important Information About Sugar       

## 2022-01-06 ENCOUNTER — Telehealth: Payer: Self-pay | Admitting: Cardiology

## 2022-01-06 NOTE — Telephone Encounter (Signed)
Patient is following up. Again, he requests a letter for physical therapy highlighting lifting restrictions.

## 2022-01-06 NOTE — Telephone Encounter (Signed)
Patient is requesting a written letter that provides an overview of his lifting restrictions due to aneurysm. Patient states this will be for his physical therapist.

## 2022-01-06 NOTE — Telephone Encounter (Signed)
Spoke with the patient. He stated that his physical therapist needs a letter stating if and what his lifting restrictions are. He would like for this to be sent to [place].

## 2022-01-07 ENCOUNTER — Encounter: Payer: Self-pay | Admitting: Nurse Practitioner

## 2022-01-07 ENCOUNTER — Ambulatory Visit: Payer: Medicare Other | Admitting: Nurse Practitioner

## 2022-01-07 ENCOUNTER — Other Ambulatory Visit (INDEPENDENT_AMBULATORY_CARE_PROVIDER_SITE_OTHER): Payer: Medicare Other

## 2022-01-07 VITALS — BP 112/76 | HR 125 | Ht 67.0 in | Wt 153.0 lb

## 2022-01-07 DIAGNOSIS — K222 Esophageal obstruction: Secondary | ICD-10-CM | POA: Diagnosis not present

## 2022-01-07 DIAGNOSIS — D649 Anemia, unspecified: Secondary | ICD-10-CM | POA: Diagnosis not present

## 2022-01-07 DIAGNOSIS — K209 Esophagitis, unspecified without bleeding: Secondary | ICD-10-CM

## 2022-01-07 LAB — IBC + FERRITIN
Ferritin: 22.3 ng/mL (ref 22.0–322.0)
Iron: 22 ug/dL — ABNORMAL LOW (ref 42–165)
Saturation Ratios: 6.6 % — ABNORMAL LOW (ref 20.0–50.0)
TIBC: 334.6 ug/dL (ref 250.0–450.0)
Transferrin: 239 mg/dL (ref 212.0–360.0)

## 2022-01-07 LAB — CBC
HCT: 30.3 % — ABNORMAL LOW (ref 39.0–52.0)
Hemoglobin: 9.7 g/dL — ABNORMAL LOW (ref 13.0–17.0)
MCHC: 32.2 g/dL (ref 30.0–36.0)
MCV: 87.7 fl (ref 78.0–100.0)
Platelets: 278 10*3/uL (ref 150.0–400.0)
RBC: 3.45 Mil/uL — ABNORMAL LOW (ref 4.22–5.81)
RDW: 18.6 % — ABNORMAL HIGH (ref 11.5–15.5)
WBC: 8.4 10*3/uL (ref 4.0–10.5)

## 2022-01-07 NOTE — Progress Notes (Signed)
Chief Complaint:  hospital follow up.    Assessment &  Plan   # 88 yo male recently hospitalized with recurrent GI bleed (black stools, FOBT+ ) and /worsening anemia with decline in hgb from 10.2 >> 6.9). History of severe esophagitis / esophageal ulcer with clot in August but no obvious source on this last EGD / colonoscopy (fair prep) in early October  Hgb improved and has remained stable at 9.6 after blood transfusion.  Of note, he takes oral iron and stools are often darker than normal but when admitted to hospital his stool was black.  We [name] have an answer about source of bleeding this time but still could have been from esophagitis. Monitor counts for now. Consider capsule if has recurrent bleeding of unknown source.  Update CBC, obtain iron studies Continue oral iron   # GERD / history of severe esophagitis / remote Barrett's esophagus.  Continue BID PPI given persistent esophagitis on recent EGD He wants to continue Carafate as it helps with post prandial heartburnn and belching. Renal function is okay. Cautioned about potential for constipation. Reminded him to go to bed on empty stomach. HOB is already elevated.   # Esophageal strictures requiring dilations in the past. Recent EGD with a  benign-appearing intrinsic moderate stenosis on recent EGD. He has "occasional" solid food dysphagia despite how well he chews. Sometimes he has vomiting without associated nausea while eating eating Offered EGD with dilation ( assuming severe esophagitis wouldn't preclude it) He understands that he is at some increased risk given advanced age. He wants to hold off. Will call us if dysphagia progresses Swallowing precautions discussed. Advised to eat slowly, chew food well before swallowing. Drink  liquids in between each bite to avoid food impaction.   HPI   88 year old male known to Dr. Hilarie Fredrickson with a past medical history significant for chronic GERD/Barrett's esophagus, severe  esophagitis, esophageal stricture, hiatal hernia repair with recurrence, olon polyps, diverticulosis bronchiectasis, prolactinoma, atrial fibrillation not anticoagulated, recurrent GI bleed, multifactorial anemia.  See PMH for additional medical history  [name] was hospitalized again in early October.  We saw him 12/03/2021 for a recurrent GI bleed with dark, Hemoccult positive stool and worsening anemia.  He underwent repeat EGD and colonoscopy without obvious source for the bleeding.  Hemoglobin improved to 9.6 after 2 units of blood.  He did have a benign-appearing intrinsic moderate stenosis of the esophagus andLA grade C esophagitis which was slightly improved compared to his last EGD on 10/02/21.  Advised to continue twice daily PPI and Carafate  As of 1020/23 his hemoglobin was stable at 9.6  Interval History:  He hasn't had an more black stools. Stools always dark on iron but not black like when he was bleeding. Compliant with BId PPI. Gets heartburn / belching sometimes after eating. Wants to continue carafate because it helps  Labs:     Latest Ref Rng & Units 12/17/2021    4:18 PM 12/06/2021    4:31 AM 12/05/2021    5:36 AM  CBC  WBC 3.4 - 10.8 x10E3/uL 12.6  11.5  11.1   Hemoglobin 13.0 - 17.7 g/dL 9.6  9.3  8.8   Hematocrit 37.5 - 51.0 % 29.7  29.6  28.3   Platelets 150 - 450 x10E3/uL 335  298  264        Latest Ref Rng & Units 12/17/2021    4:18 PM 12/02/2021    4:06 PM 12/01/2021   10:39 AM  Hepatic Function  Total Protein 6.0 - 8.5 g/dL 6.2  5.9  5.7   Albumin 3.7 - 4.7 g/dL 3.9  3.4  3.8   AST 0 - 40 IU/L _0 ALT 0 - 44 IU/L _1 Alk Phosphatase 44 - 121 IU/L 95  56  64   Total Bilirubin 0.0 - 1.2 mg/dL 0.7  0.5  0.5      Past Medical History:  Diagnosis Date   Abnormality of gait 06/07/2013   Anemia    Aneurysm ([place])    on assending aorta, currently watching it, Dr Cyndia Bent   Anxiety    Arthritis    Asthma    Atrial fibrillation ([place])     B12 deficiency    Barrett's esophagus    CAD (coronary artery disease) 03/2006   3.0 x 20 mm TAXUS Perseus DES to the LAD; 01/2007  L main 30%, oLAD 50%, pLAD stent ok, CFX 80%, OM 60%, pRCA 60%, mRCA 70%, oPDA 90%; med rx    Cancer ([place])    skin cancer    Cataract    bilateral removal of cateracts   COLONIC POLYPS 07/17/2008   Qualifier: Diagnosis of  By: Lovette Cliche, CNA, Christy     Complication of anesthesia     no issues,but pt prefers spinal due to Pulmonary problems   COPD (chronic obstructive pulmonary disease) ([place])    oxygen  on standby in home.   Depression    Diverticulosis    Emphysema    Enlarged prostate with urinary retention    Esophageal stenosis    Gastric ulcer    GERD (gastroesophageal reflux disease)    Hiatal hernia    Hypothyroidism    Neuropathy    Osteoporosis    Pituitary macroadenoma ([facility])    Restless legs syndrome (RLS)    Tubular adenoma of colon    UGIB (upper gastrointestinal bleed) 03/2013   EGD w/ large ulcer at GE junction    Past Surgical History:  Procedure Laterality Date   ABDOMINAL HERNIA REPAIR   2008   BIOPSY  06/13/2020   Procedure: BIOPSY;  Surgeon: Harvel Quale, MD;  [facility];  Service: Gastroenterology;;  gastric   BIOPSY  12/04/2021   Procedure: BIOPSY;  Surgeon: Juanita Craver, MD;  [facility];  Service: Gastroenterology;;   CARDIAC CATHETERIZATION  01/2007   L main 30%, oLAD 50%,  pLAD stent ok, CFX 80%, OM 60%, pRCA 60%, mRCA 70%, oPDA 90%; med rx   cataract extraction both eyes     COLONOSCOPY     COLONOSCOPY WITH PROPOFOL N/A 06/13/2020   Procedure: COLONOSCOPY WITH PROPOFOL;  Surgeon: Harvel Quale, MD;  [facility];  Service: Gastroenterology;  Laterality: N/A;   COLONOSCOPY WITH PROPOFOL N/A 12/04/2021   Procedure: COLONOSCOPY WITH PROPOFOL;  Surgeon: Juanita Craver, MD;  [facility];  Service: Gastroenterology;  Laterality: N/A;   CORONARY ANGIOPLASTY      CORONARY STENT PLACEMENT  03/2006   3.0 x 20 mm TAXUS Perseus DES to the LAD   CYSTOSCOPY N/A 06/10/2015   Procedure: CYSTOSCOPY FULGRATION OF BLEEDING,  electovapor resection;  Surgeon: Irine Seal, MD;  [facility];  Service: Urology;  Laterality: N/A;   CYSTOSCOPY WITH INSERTION OF UROLIFT N/A 06/01/2015   Procedure: CYSTOSCOPY WITH INSERTION OF UROLIFT x4;  Surgeon: Irine Seal, MD;  [facility];  Service: Urology;  Laterality: N/A;  ESOPHAGOGASTRODUODENOSCOPY N/A 04/20/2013   Procedure: ESOPHAGOGASTRODUODENOSCOPY (EGD);  Surgeon: Inda Castle, MD;  [facility];  Service: Endoscopy;  Laterality: N/A;   ESOPHAGOGASTRODUODENOSCOPY N/A 03/25/2016   Procedure: ESOPHAGOGASTRODUODENOSCOPY (EGD);  Surgeon: Irene Shipper, MD;  [facility];  Service: Endoscopy;  Laterality: N/A;   ESOPHAGOGASTRODUODENOSCOPY (EGD) WITH PROPOFOL N/A 10/13/2015   Procedure: ESOPHAGOGASTRODUODENOSCOPY (EGD) WITH PROPOFOL;  Surgeon: Jerene Bears, MD;  [facility];  Service: Gastroenterology;  Laterality: N/A;   ESOPHAGOGASTRODUODENOSCOPY (EGD) WITH PROPOFOL N/A 06/13/2020   Procedure: ESOPHAGOGASTRODUODENOSCOPY (EGD) WITH PROPOFOL;  Surgeon: Harvel Quale, MD;  [facility];  Service: Gastroenterology;  Laterality: N/A;   ESOPHAGOGASTRODUODENOSCOPY (EGD) WITH PROPOFOL N/A 10/02/2021   Procedure: ESOPHAGOGASTRODUODENOSCOPY (EGD) WITH PROPOFOL;  Surgeon: Daryel November, MD;  [facility];  Service: Gastroenterology;  Laterality: N/A;   ESOPHAGOGASTRODUODENOSCOPY (EGD) WITH PROPOFOL N/A 12/03/2021   Procedure: ESOPHAGOGASTRODUODENOSCOPY (EGD) WITH PROPOFOL;  Surgeon: Ladene Artist, MD;  [facility];  Service: Gastroenterology;  Laterality: N/A;   FOOT SURGERY  1994 left, 2002 right foot   bilateral foot reconstruciton   FOOT SURGERY     reconstruction of both feet- no retained hardware.   HERNIA REPAIR     HIATAL HERNIA REPAIR  01-04-2008    HOT HEMOSTASIS N/A 10/02/2021   Procedure: HOT HEMOSTASIS (ARGON PLASMA COAGULATION/BICAP);  Surgeon: Daryel November, MD;  [facility];  Service: Gastroenterology;  Laterality: N/A;   JOINT REPLACEMENT     MELANOMA EXCISION  2019   POLYPECTOMY     POLYPECTOMY  06/13/2020   Procedure: POLYPECTOMY;  Surgeon: Harvel Quale, MD;  [facility];  Service: Gastroenterology;;  ascending,    POLYPECTOMY  12/04/2021   Procedure: POLYPECTOMY;  Surgeon: Juanita Craver, MD;  [facility];  Service: Gastroenterology;;   PROSTATE SURGERY     x 2   SAVORY DILATION N/A 03/25/2016   Procedure: SAVORY DILATION;  Surgeon: Irene Shipper, MD;  [facility];  Service: Endoscopy;  Laterality: N/A;   TOTAL HIP ARTHROPLASTY  07/21/2011   Procedure: TOTAL HIP ARTHROPLASTY ANTERIOR APPROACH;  Surgeon: Mauri Pole, MD;  [facility];  Service: Orthopedics;  Laterality: Left;   TOTAL HIP ARTHROPLASTY Right 09/07/2012   Procedure: RIGHT TOTAL HIP ARTHROPLASTY ANTERIOR APPROACH;  Surgeon: Mcarthur Rossetti, MD;  [facility];  Service: Orthopedics;  Laterality: Right;   TRANSURETHRAL RESECTION OF BLADDER NECK N/A 11/04/2015   Procedure: RESECTION OF BLADDER NECK;  Surgeon: Cleon Gustin, MD;  [facility];  Service: Urology;  Laterality: N/A;   TRANSURETHRAL RESECTION OF PROSTATE N/A 11/04/2015   Procedure: TRANSURETHRAL RESECTION OF THE PROSTATE (TURP); REMOVAL OF UROLIFT IMPLANTS X THREE;  Surgeon: Cleon Gustin, MD;  [facility];  Service: Urology;  Laterality: N/A;   UPPER GASTROINTESTINAL ENDOSCOPY  12/2013   Dr Hilarie Fredrickson, gastritis   VIDEO BRONCHOSCOPY Bilateral 02/01/2017   Procedure: VIDEO BRONCHOSCOPY WITHOUT FLUORO;  Surgeon: Tanda Rockers, MD;  [facility];  Service: Cardiopulmonary;  Laterality: Bilateral;    Current Medications, Allergies, Family History and Social History were reviewed in [employer] record.     Current Outpatient Medications  Medication Sig Dispense Refill   Ascorbic Acid (VITAMIN C) 500 MG CAPS Take 1 capsule by mouth daily.     budesonide-formoterol (SYMBICORT) 160-4.5 MCG/ACT inhaler Inhale 2 puffs into the lungs in the morning and at bedtime. 10.2 g 6   cabergoline (DOSTINEX)  0.5 MG tablet Take 0.5 tablets (0.25 mg total) by mouth 2 (two) times a week. 12 tablet 1   calcium carbonate (OS-CAL - DOSED IN MG OF ELEMENTAL CALCIUM) 1250 (500 Ca) MG tablet Take 1 tablet by mouth daily with breakfast.     cholecalciferol (VITAMIN D) 1000 UNITS tablet Take 1,000 Units by mouth daily.      diclofenac Sodium (VOLTAREN) 1 % GEL Apply 2 g topically 4 (four) times daily. 50 g 5   diphenhydrAMINE (BENADRYL) 25 MG tablet Take 25 mg by mouth every 6 (six) hours as needed for itching.     diphenhydramine-acetaminophen (TYLENOL PM) 25-500 MG TABS tablet Take 1/2 tablet by mouth at bedtime as needed for sleep     FEROSUL 325 (65 Fe) MG tablet TAKE (1) TABLET DAILY WITH BREAKFAST. 90 tablet 3   GAVILAX 17 GM/SCOOP powder DISSOLVE 17 GM SCOOP IN 8 OZ. OF WATER AND DRINK ONCE DAILY AS NEEDED FOR CONSTIPATION 510 g 0   guaiFENesin (MUCINEX) 600 MG 12 hr tablet Take 1 tablet (600 mg total) by mouth 2 (two) times daily. 60 tablet 0   levalbuterol (XOPENEX HFA) 45 MCG/ACT inhaler Inhale 1 puff into the lungs every 4 (four) hours as needed for wheezing. 1 each 2   levothyroxine (SYNTHROID) 75 MCG tablet Take 1 tablet (75 mcg total) by mouth daily before breakfast. 90 tablet 1   magnesium oxide (MAG-OX) 400 (241.3 Mg) MG tablet Take 1 tablet (400 mg total) by mouth 2 (two) times daily. 15 tablet 0   Melatonin 5 MG CAPS Take 1 capsule by mouth at bedtime.     metoprolol succinate (TOPROL XL) 25 MG 24 hr tablet Take 1 tablet (25 mg total) by mouth daily. 30 tablet 2   naphazoline-pheniramine (NAPHCON-A) 0.025-0.3 % ophthalmic solution Place 1 drop into the left eye 2 (two) times daily as  needed for irritation or allergies.     nitroGLYCERIN (NITROSTAT) 0.4 MG SL tablet Place 1 tablet (0.4 mg total) under the tongue every 5 (five) minutes as needed for chest pain. 25 tablet 2   pantoprazole (PROTONIX) 40 MG tablet Take 1 tablet (40 mg total) by mouth 2 (two) times daily. 60 tablet 2   psyllium (METAMUCIL) 28 % packet Take 1 packet by mouth daily. 30 packet 1   Respiratory Therapy Supplies (FLUTTER) DEVI Twice a day and prn as needed, may increase if feeling worse 1 each 0   sodium chloride HYPERTONIC 3 % nebulizer solution Take by nebulization as needed for other. 750 mL 12   sucralfate (CARAFATE) 1 g tablet Take 1 tablet (1 g total) by mouth 4 (four) times daily -  with meals and at bedtime. 120 tablet 2   tamsulosin (FLOMAX) 0.4 MG CAPS capsule Take 1 capsule (0.4 mg total) by mouth daily after supper. 90 capsule 3   traMADol (ULTRAM) 50 MG tablet Take 1 tablet (50 mg total) by mouth 2 (two) times daily as needed. 30 tablet 2   triamcinolone cream (KENALOG) 0.1 % Apply 1 application topically 2 (two) times daily. (Patient not taking: Reported on 01/05/2022) 30 g 0   Turmeric 500 MG CAPS Take 1 capsule by mouth daily.     vitamin B-12 (CYANOCOBALAMIN) 1000 MCG tablet Take 1,000 mcg by mouth daily.     vitamin E 180 MG (400 UNITS) capsule Take 400 Units by mouth daily.     No current facility-administered medications for this visit.    Review of Systems: No  chest pain. No shortness of breath. No urinary complaints.    Physical Exam  Wt Readings from Last 3 Encounters:  01/05/22 155 lb (70.3 kg)  12/17/21 156 lb (70.8 kg)  12/02/21 152 lb (68.9 kg)    BP 112/76   Pulse (!) 125   Ht _0  (1.702 m)   Wt 153 lb (69.4 kg)   BMI 23.96 kg/m  Constitutional:  Generally well appearing male in no acute distress. Psychiatric: Pleasant. Normal mood and affect. Behavior is normal. EENT: Pupils normal.  Conjunctivae are normal. No scleral icterus. Neck supple.   Cardiovascular: Normal rate, regular rhythm. No edema Pulmonary/chest: Effort normal and breath sounds normal. No wheezing, rales or rhonchi. Abdominal: Soft, nondistended, nontender. Bowel sounds active throughout. There are no masses palpable. No hepatomegaly. Neurological: Alert and oriented to person place and time. Skin: Skin is warm and dry. No rashes noted.  I spent 30 minutes total reviewing records, obtaining history, performing exam, counseling patient and documenting visit / findings.   Tye Savoy, NP  01/07/2022, 8:21 AM

## 2022-01-07 NOTE — Patient Instructions (Signed)
_______________________________________________________  If you are age 86 or older, your body mass index should be between 23-30. Your Body mass index is 23.96 kg/m. If this is out of the aforementioned range listed, please consider follow up with your Primary Care Provider.  If you are age 26 or younger, your body mass index should be between 19-25. Your Body mass index is 23.96 kg/m. If this is out of the aformentioned range listed, please consider follow up with your Primary Care Provider.   ________________________________________________________  The [place] [facility] providers would like to encourage you to use [facility] to communicate with providers for non-urgent requests or questions.  Due to long hold times on the telephone, sending your provider a message by [facility] may be a faster and more efficient way to get a response.  Please allow 48 business hours for a response.  Please remember that this is for non-urgent requests.  _______________________________________________________  Your provider has requested that you go to the basement level for lab work before leaving today. Press "B" on the elevator. The lab is located at the first door on the left as you exit the elevator.  Continue Carafate before meals and twice a day Pantoprazole.  Call the office if your swallowing gets worse

## 2022-01-10 NOTE — Progress Notes (Signed)
Addendum: Reviewed and agree with assessment and management plan. Terrius Gentile M, MD  

## 2022-01-12 ENCOUNTER — Other Ambulatory Visit: Payer: Self-pay

## 2022-01-12 ENCOUNTER — Telehealth: Payer: Self-pay | Admitting: Pharmacy Technician

## 2022-01-12 DIAGNOSIS — D509 Iron deficiency anemia, unspecified: Secondary | ICD-10-CM

## 2022-01-12 DIAGNOSIS — K922 Gastrointestinal hemorrhage, unspecified: Secondary | ICD-10-CM

## 2022-01-12 DIAGNOSIS — D500 Iron deficiency anemia secondary to blood loss (chronic): Secondary | ICD-10-CM | POA: Insufficient documentation

## 2022-01-12 NOTE — Telephone Encounter (Addendum)
[name], [place] note:  Auth Submission: NO [place] approved Payer: [employer] Medication & CPT/J Code(s) submitted: Feraheme (ferumoxytol) [id number] Route of submission (phone, fax, portal): PORTAL Phone # Fax # Auth type: Buy/Bill Units/visits requested: X1 Reference number: [id number] Approval from: 01/12/22 to 01/13/23   Patient will be scheduled as soon as possible.

## 2022-01-13 ENCOUNTER — Encounter: Payer: Self-pay | Admitting: "Endocrinology

## 2022-01-13 ENCOUNTER — Ambulatory Visit: Payer: Medicare Other | Admitting: "Endocrinology

## 2022-01-13 VITALS — BP 110/66 | HR 64 | Ht 67.0 in | Wt 157.6 lb

## 2022-01-13 DIAGNOSIS — D352 Benign neoplasm of pituitary gland: Secondary | ICD-10-CM | POA: Diagnosis not present

## 2022-01-13 DIAGNOSIS — E039 Hypothyroidism, unspecified: Secondary | ICD-10-CM | POA: Diagnosis not present

## 2022-01-13 DIAGNOSIS — D353 Benign neoplasm of craniopharyngeal duct: Secondary | ICD-10-CM

## 2022-01-13 DIAGNOSIS — E221 Hyperprolactinemia: Secondary | ICD-10-CM | POA: Diagnosis not present

## 2022-01-13 MED ORDER — CABERGOLINE 0.5 MG PO TABS: 0.5000 mg | ORAL_TABLET | ORAL | 1 refills | Status: AC

## 2022-01-13 NOTE — Progress Notes (Signed)
01/13/2022, 2:28 PM   Endocrinology follow-up note  Subjective:    Patient ID: [name], male    DOB: [birth year], PCP Dettinger, [name], MD   Past Medical History:  Diagnosis Date   Abnormality of gait 06/07/2013   Anemia    Aneurysm ([place])    on assending aorta, currently watching it, Dr Cyndia Bent   Anxiety    Arthritis    Asthma    Atrial fibrillation ([place])    B12 deficiency    Barrett's esophagus    CAD (coronary artery disease) 03/2006   3.0 x 20 mm TAXUS Perseus DES to the LAD; 01/2007  L main 30%, oLAD 50%, pLAD stent ok, CFX 80%, OM 60%, pRCA 60%, mRCA 70%, oPDA 90%; med rx    Cancer ([place])    skin cancer    Cataract    bilateral removal of cateracts   COLONIC POLYPS 07/17/2008   Qualifier: Diagnosis of  By: Lovette Cliche, CNA, Christy     Complication of anesthesia     no issues,but pt prefers spinal due to Pulmonary problems   COPD (chronic obstructive pulmonary disease) ([place])    oxygen  on standby in home.   Depression    Diverticulosis    Emphysema    Enlarged prostate with urinary retention    Esophageal stenosis    Gastric ulcer    GERD (gastroesophageal reflux disease)    Hiatal hernia    Hypothyroidism    Neuropathy    Osteoporosis    Pituitary macroadenoma (H[facility])    Restless legs syndrome (RLS)    Tubular adenoma of colon    UGIB (upper gastrointestinal bleed) 03/2013   EGD w/ large ulcer at GE junction   Past Surgical History:  Procedure Laterality Date   ABDOMINAL HERNIA REPAIR   2008   BIOPSY  06/13/2020   Procedure: BIOPSY;  Surgeon: Harvel Quale, MD;  [facility];  Service: Gastroenterology;;  gastric   BIOPSY  12/04/2021   Procedure: BIOPSY;  Surgeon: Juanita Craver, MD;  [facility];  Service: Gastroenterology;;   CARDIAC CATHETERIZATION  01/2007   L main 30%, oLAD 50%,  pLAD stent ok, CFX 80%, OM 60%, pRCA 60%, mRCA 70%, oPDA 90%; med rx   cataract extraction  both eyes     COLONOSCOPY     COLONOSCOPY WITH PROPOFOL N/A 06/13/2020   Procedure: COLONOSCOPY WITH PROPOFOL;  Surgeon: Harvel Quale, MD;  [facility];  Service: Gastroenterology;  Laterality: N/A;   COLONOSCOPY WITH PROPOFOL N/A 12/04/2021   Procedure: COLONOSCOPY WITH PROPOFOL;  Surgeon: Juanita Craver, MD;  [facility];  Service: Gastroenterology;  Laterality: N/A;   CORONARY ANGIOPLASTY     CORONARY STENT PLACEMENT  03/2006   3.0 x 20 mm TAXUS Perseus DES to the LAD   CYSTOSCOPY N/A 06/10/2015   Procedure: CYSTOSCOPY FULGRATION OF BLEEDING,  electovapor resection;  Surgeon: Irine Seal, MD;  [facility];  Service: Urology;  Laterality: N/A;   CYSTOSCOPY WITH INSERTION OF UROLIFT N/A 06/01/2015   Procedure: CYSTOSCOPY WITH INSERTION OF UROLIFT x4;  Surgeon: Irine Seal, MD;  [facility];  Service: Urology;  Laterality: N/A;   ESOPHAGOGASTRODUODENOSCOPY N/A 04/20/2013   Procedure: ESOPHAGOGASTRODUODENOSCOPY (EGD);  Surgeon: Inda Castle, MD;  [facility];  Service: Endoscopy;  Laterality: N/A;   ESOPHAGOGASTRODUODENOSCOPY N/A 03/25/2016   Procedure: ESOPHAGOGASTRODUODENOSCOPY (EGD);  Surgeon: Irene Shipper, MD;  Location: [facility];  Service: Endoscopy;  Laterality: N/A;   ESOPHAGOGASTRODUODENOSCOPY (EGD) WITH PROPOFOL N/A 10/13/2015   Procedure: ESOPHAGOGASTRODUODENOSCOPY (EGD) WITH PROPOFOL;  Surgeon: Jerene Bears, MD;  [facility];  Service: Gastroenterology;  Laterality: N/A;   ESOPHAGOGASTRODUODENOSCOPY (EGD) WITH PROPOFOL N/A 06/13/2020   Procedure: ESOPHAGOGASTRODUODENOSCOPY (EGD) WITH PROPOFOL;  Surgeon: Harvel Quale, MD;  [facility];  Service: Gastroenterology;  Laterality: N/A;   ESOPHAGOGASTRODUODENOSCOPY (EGD) WITH PROPOFOL N/A 10/02/2021   Procedure: ESOPHAGOGASTRODUODENOSCOPY (EGD) WITH PROPOFOL;  Surgeon: Daryel November, MD;  [facility];  Service: Gastroenterology;  Laterality:  N/A;   ESOPHAGOGASTRODUODENOSCOPY (EGD) WITH PROPOFOL N/A 12/03/2021   Procedure: ESOPHAGOGASTRODUODENOSCOPY (EGD) WITH PROPOFOL;  Surgeon: Ladene Artist, MD;  [facility];  Service: Gastroenterology;  Laterality: N/A;   FOOT SURGERY  1994 left, 2002 right foot   bilateral foot reconstruciton   FOOT SURGERY     reconstruction of both feet- no retained hardware.   HERNIA REPAIR     HIATAL HERNIA REPAIR  01-04-2008   HOT HEMOSTASIS N/A 10/02/2021   Procedure: HOT HEMOSTASIS (ARGON PLASMA COAGULATION/BICAP);  Surgeon: Daryel November, MD;  [facility];  Service: Gastroenterology;  Laterality: N/A;   JOINT REPLACEMENT     MELANOMA EXCISION  2019   POLYPECTOMY     POLYPECTOMY  06/13/2020   Procedure: POLYPECTOMY;  Surgeon: Harvel Quale, MD;  [facility];  Service: Gastroenterology;;  ascending,    POLYPECTOMY  12/04/2021   Procedure: POLYPECTOMY;  Surgeon: Juanita Craver, MD;  Location[facility];  Service: Gastroenterology;;   PROSTATE SURGERY     x 2   SAVORY DILATION N/A 03/25/2016   Procedure: SAVORY DILATION;  Surgeon: Irene Shipper, MD;  [facility];  Service: Endoscopy;  Laterality: N/A;   TOTAL HIP ARTHROPLASTY  07/21/2011   Procedure: TOTAL HIP ARTHROPLASTY ANTERIOR APPROACH;  Surgeon: Mauri Pole, MD;  [facility];  Service: Orthopedics;  Laterality: Left;   TOTAL HIP ARTHROPLASTY Right 09/07/2012   Procedure: RIGHT TOTAL HIP ARTHROPLASTY ANTERIOR APPROACH;  Surgeon: Mcarthur Rossetti, MD;  [facility];  Service: Orthopedics;  Laterality: Right;   TRANSURETHRAL RESECTION OF BLADDER NECK N/A 11/04/2015   Procedure: RESECTION OF BLADDER NECK;  Surgeon: Cleon Gustin, MD;  [facility];  Service: Urology;  Laterality: N/A;   TRANSURETHRAL RESECTION OF PROSTATE N/A 11/04/2015   Procedure: TRANSURETHRAL RESECTION OF THE PROSTATE (TURP); REMOVAL OF UROLIFT IMPLANTS X THREE;  Surgeon: Cleon Gustin, MD;   [facility];  Service: Urology;  Laterality: N/A;   UPPER GASTROINTESTINAL ENDOSCOPY  12/2013   Dr Hilarie Fredrickson, gastritis   VIDEO BRONCHOSCOPY Bilateral 02/01/2017   Procedure: VIDEO BRONCHOSCOPY WITHOUT FLUORO;  Surgeon: Tanda Rockers, MD;  [facility];  Service: Cardiopulmonary;  Laterality: Bilateral;   Social History   Socioeconomic History   Marital status: Widowed    Spouse name: Not on file   Number of children: 2   Years of education: 14   Highest education level: Associate degree: occupational, Hotel manager, or vocational program  Occupational History   Occupation: retired  Tobacco Use   Smoking status: Never   Smokeless tobacco: Never  Vaping Use   Vaping Use: Never used  Substance and Sexual Activity   Alcohol use:  No   Drug use: No   Sexual activity: Not Currently    Birth control/protection: None  Other Topics Concern   Not on file  Social History Narrative   Patient drinks caffeine a few times a week.   Patient is right handed.   Social Determinants of Health   Financial Resource Strain: Low Risk  (02/08/2021)   Overall Financial Resource Strain (CARDIA)    Difficulty of Paying Living Expenses: Not hard at all  Food Insecurity: No Food Insecurity (12/02/2021)   Hunger Vital Sign    Worried About Running Out of Food in the Last Year: Never true    Ran Out of Food in the Last Year: Never true  Transportation Needs: No Transportation Needs (12/02/2021)   PRAPARE - Hydrologist (Medical): No    Lack of Transportation (Non-Medical): No  Physical Activity: Insufficiently Active (02/08/2021)   Exercise Vital Sign    Days of Exercise per Week: 7 days    Minutes of Exercise per Session: 20 min  Stress: No Stress Concern Present (02/08/2021)   [place]    Feeling of Stress : Not at all  Social Connections: Moderately Integrated (02/08/2021)   Social  Connection and Isolation Panel [NHANES]    Frequency of Communication with Friends and Family: More than three times a week    Frequency of Social Gatherings with Friends and Family: More than three times a week    Attends Religious Services: More than 4 times per year    Active Member of [employer] or Organizations: Yes    Attends Archivist Meetings: More than 4 times per year    Marital Status: Widowed   Family History  Problem Relation Age of Onset   Heart disease Sister    Heart failure Father    Other Mother        phebitis related to Bilaal's birth, he was 44 weeks old when she died   Colon cancer Neg Hx    Esophageal cancer Neg Hx    Rectal cancer Neg Hx    Stomach cancer Neg Hx    Colon polyps Neg Hx    Outpatient Encounter Medications as of 01/13/2022  Medication Sig   Ascorbic Acid (VITAMIN C) 500 MG CAPS Take 1 capsule by mouth daily.   aspirin EC 81 MG tablet Take 81 mg by mouth daily. Swallow whole.   budesonide-formoterol (SYMBICORT) 160-4.5 MCG/ACT inhaler Inhale 2 puffs into the lungs in the morning and at bedtime.   cabergoline (DOSTINEX) 0.5 MG tablet Take 1 tablet (0.5 mg total) by mouth 2 (two) times a week.   calcium carbonate (OS-CAL - DOSED IN MG OF ELEMENTAL CALCIUM) 1250 (500 Ca) MG tablet Take 1 tablet by mouth daily with breakfast. (Patient not taking: Reported on 01/07/2022)   cholecalciferol (VITAMIN D) 1000 UNITS tablet Take 1,000 Units by mouth daily.    diclofenac Sodium (VOLTAREN) 1 % GEL Apply 2 g topically 4 (four) times daily.   diphenhydrAMINE (BENADRYL) 25 MG tablet Take 25 mg by mouth every 6 (six) hours as needed for itching. (Patient not taking: Reported on 01/07/2022)   diphenhydramine-acetaminophen (TYLENOL PM) 25-500 MG TABS tablet Take 1/2 tablet by mouth at bedtime as needed for sleep   FEROSUL 325 (65 Fe) MG tablet TAKE (1) TABLET DAILY WITH BREAKFAST.   GAVILAX 17 GM/SCOOP powder DISSOLVE 17 GM SCOOP IN 8 OZ. OF WATER AND DRINK ONCE  DAILY AS NEEDED FOR CONSTIPATION   guaiFENesin (MUCINEX) 600 MG 12 hr tablet Take 1 tablet (600 mg total) by mouth 2 (two) times daily.   levalbuterol (XOPENEX HFA) 45 MCG/ACT inhaler Inhale 1 puff into the lungs every 4 (four) hours as needed for wheezing. (Patient not taking: Reported on 01/07/2022)   levothyroxine (SYNTHROID) 75 MCG tablet Take 1 tablet (75 mcg total) by mouth daily before breakfast.   magnesium oxide (MAG-OX) 400 (241.3 Mg) MG tablet Take 1 tablet (400 mg total) by mouth 2 (two) times daily.   Melatonin 5 MG CAPS Take 1 capsule by mouth at bedtime. (Patient not taking: Reported on 01/07/2022)   metoprolol succinate (TOPROL XL) 25 MG 24 hr tablet Take 1 tablet (25 mg total) by mouth daily.   naphazoline-pheniramine (NAPHCON-A) 0.025-0.3 % ophthalmic solution Place 1 drop into the left eye 2 (two) times daily as needed for irritation or allergies.   nitroGLYCERIN (NITROSTAT) 0.4 MG SL tablet Place 1 tablet (0.4 mg total) under the tongue every 5 (five) minutes as needed for chest pain.   pantoprazole (PROTONIX) 40 MG tablet Take 1 tablet (40 mg total) by mouth 2 (two) times daily.   psyllium (METAMUCIL) 28 % packet Take 1 packet by mouth daily.   Respiratory Therapy Supplies (FLUTTER) DEVI Twice a day and prn as needed, may increase if feeling worse (Patient not taking: Reported on 01/07/2022)   sodium chloride HYPERTONIC 3 % nebulizer solution Take by nebulization as needed for other.   sucralfate (CARAFATE) 1 g tablet Take 1 tablet (1 g total) by mouth 4 (four) times daily -  with meals and at bedtime.   tamsulosin (FLOMAX) 0.4 MG CAPS capsule Take 1 capsule (0.4 mg total) by mouth daily after supper.   traMADol (ULTRAM) 50 MG tablet Take 1 tablet (50 mg total) by mouth 2 (two) times daily as needed. (Patient not taking: Reported on 01/07/2022)   triamcinolone cream (KENALOG) 0.1 % Apply 1 application topically 2 (two) times daily. (Patient not taking: Reported on 01/05/2022)    Turmeric 500 MG CAPS Take 1 capsule by mouth daily. (Patient not taking: Reported on 01/07/2022)   vitamin B-12 (CYANOCOBALAMIN) 1000 MCG tablet Take 1,000 mcg by mouth daily.   vitamin E 180 MG (400 UNITS) capsule Take 400 Units by mouth daily.   [DISCONTINUED] cabergoline (DOSTINEX) 0.5 MG tablet Take 0.5 tablets (0.25 mg total) by mouth 2 (two) times a week. (Patient not taking: Reported on 01/07/2022)   No facility-administered encounter medications on file as of 01/13/2022.   ALLERGIES: Allergies  Allergen Reactions   Gadavist [Gadobutrol] Hives   Prednisone     Agitation and hallucinations   Betadine [Povidone Iodine] Other (See Comments)    Blisters    Lortab [Hydrocodone-Acetaminophen] Nausea And Vomiting   Penicillins Other (See Comments)    Lightheadedness  Has patient had a PCN reaction causing immediate rash, facial/tongue/throat swelling, SOB or lightheadedness with hypotension: Yes Has patient had a PCN reaction causing severe rash involving mucus membranes or skin necrosis: No Has patient had a PCN reaction that required hospitalization No Has patient had a PCN reaction occurring within the last 10 years: No If all of the above answers are "NO", then may proceed with Cephalosporin use.   Zetia [Ezetimibe] Other (See Comments)    Muscle weakness    Zocor [Simvastatin] Nausea Only and Other (See Comments)    Muscle weakness     VACCINATION STATUS: Immunization History  Administered Date(s) Administered  Fluad Quad(high Dose 65+) 12/10/2018, 01/17/2020, 01/13/2021, 12/01/2021   Influenza Whole 11/22/2007, 10/29/2009, 10/30/2010, 11/29/2011   Influenza, High Dose Seasonal PF 12/03/2015, 12/26/2016, 12/27/2017   Influenza,inj,Quad PF,6+ Mos 12/03/2012, 11/25/2013, 12/19/2014   Moderna Sars-Covid-2 Vaccination 04/11/2019, 05/10/2019, 03/31/2020   Pneumococcal Conjugate-13 01/09/2013   Pneumococcal Polysaccharide-23 10/29/2009   Td 04/29/1998   Tdap 01/15/2019,  01/15/2019   Zoster Recombinat (Shingrix) 07/14/2021   Zoster, Live 03/31/2006    HPI [name] is 88 y.o. male who presents today with a medical history as above. he is being seen in follow-up after he was seen in consultation for hyperprolactinemia/pituitary adenoma requested by Dettinger, [name], MD.   See notes from prior visit.  He reports that he was diagnosed with fibroadenoma for at least for the last 15 years.  He was treated for hyperprolactinemia for at least 5 years with largely bromocriptine 1 to twice a day continuously with mixed results.  His prolactin level remains high even though he is slowly responding. His most recent MRI showed stable finding of pituitary lesion which is approximately 1.4 cm x 2 cm x 1.5 cm. He denies visual field deficits, no retro-orbital headaches. He does not recall if he ever had bone density.  He walks with a cane for disequilibrium.  He does not have diabetes nor hypertension. He has seen Dr. Loanne Drilling in [place] for several years.  His prolactin level in May 2022 was 291, November 2022 was 196, May 2023 was 146.  Normal range of prolactin is 4-15. He does not have acute complaints today.  During his last visit, he was switched to cabergoline 0.25 mg twice a week.  He reports compliance and consistency taking this medication.  He returns with prolactin level 158. He is also on levothyroxine for hypothyroidism.  Review of Systems  Constitutional: no recent weight gain/loss, no fatigue, no subjective hyperthermia, no subjective hypothermia, ambulates with a cane for disequilibrium. Eyes: no blurry vision, no xerophthalmia   Objective:       01/13/2022   10:55 AM 01/07/2022    9:12 AM 01/05/2022    2:25 PM  Vitals with BMI  Height '5\' 7"'$  '5\' 7"'$  '5\' 7"'$   Weight 157 lbs 10 oz 153 lbs 155 lbs  BMI 24.68 33.54 56.25  Systolic 638 937 342  Diastolic 66 76 72  Pulse 64 125 91    BP 110/66   Pulse 64   Ht '5\' 7"'$  (1.702 m)   Wt 157 lb  9.6 oz (71.5 kg)   BMI 24.68 kg/m   Wt Readings from Last 3 Encounters:  01/13/22 157 lb 9.6 oz (71.5 kg)  01/07/22 153 lb (69.4 kg)  01/05/22 155 lb (70.3 kg)    Physical Exam  Constitutional:  Body mass index is 24.68 kg/m.,  not in acute distress, normal state of mind, + walks with a cane. Eyes: PERRLA, EOMI, no exophthalmos ENT: moist mucous membranes, no gross thyromegaly, no gross cervical lymphadenopathy Cardiovascular: normal precordial activity, Regular Rate and Rhythm, no Murmur/Rubs/Gallops   CMP ( most recent) CMP     Component Value Date/Time   NA 140 12/17/2021 1618   K 4.6 12/17/2021 1618   CL 105 12/17/2021 1618   CO2 24 12/17/2021 1618   GLUCOSE 84 12/17/2021 1618   GLUCOSE 97 12/06/2021 0431   BUN 19 12/17/2021 1618   CREATININE 1.05 12/17/2021 1618   CALCIUM 8.9 12/17/2021 1618   PROT 6.2 12/17/2021 1618   ALBUMIN 3.9 12/17/2021 1618  AST 21 12/17/2021 1618   ALT 14 12/17/2021 1618   ALKPHOS 95 12/17/2021 1618   BILITOT 0.7 12/17/2021 1618   GFRNONAA >60 12/06/2021 0431   GFRAA 74 04/22/2020 1125      Lipid Panel ( most recent) Lipid Panel     Component Value Date/Time   CHOL 144 12/01/2021 1039   TRIG 98 12/01/2021 1039   TRIG 71 04/01/2015 0934   HDL 44 12/01/2021 1039   HDL 56 04/01/2015 0934   CHOLHDL 3.3 12/01/2021 1039   CHOLHDL 2.9 06/12/2015 0529   VLDL 9 06/12/2015 0529   LDLCALC 82 12/01/2021 1039   LDLCALC 121 (H) 08/29/2013 0909   LABVLDL 18 12/01/2021 1039      Lab Results  Component Value Date   TSH 1.160 12/24/2021   TSH 3.940 12/01/2021   TSH 2.622 09/30/2021   TSH 1.260 07/14/2021   TSH 2.960 01/13/2021   TSH 1.58 07/14/2020   TSH 0.869 04/22/2020   TSH 0.70 01/14/2020   TSH 1.010 09/24/2019   TSH 1.160 05/03/2019   FREET4 1.48 12/24/2021   FREET4 1.00 07/14/2020   FREET4 1.26 01/14/2020   FREET4 0.85 05/05/2017   FREET4 0.88 05/06/2016   FREET4 0.88 04/20/2013   FREET4 0.84 11/12/2012   FREET4 0.9  09/11/2008   FREET4 0.9 10/24/2007   FREET4 1.14 09/10/2007       MRI of the brain without contrast on March 02, 2021  Redemonstration of pituitary lesion in the sella, left greater than right which measures approximately 1.4 x 2 x 1.5 cm.  Previously 1.3 x 1.9 x 1.6 cm. IMPRESSION: 1. Evaluation is somewhat limited by motion artifact and absence of intravenous contrast. Within this limitation, again noted is a pituitary lesion, which appears overall unchanged compared to 05/17/2018.   2.   No acute intracranial process.     Latest Reference Range & Units 12/24/21 10:23  Prolactin 4.0 - 15.2 ng/mL 158.0 (H)  TSH 0.450 - 4.500 uIU/mL 1.160  T4,Free(Direct) 0.82 - 1.77 ng/dL 1.48  (H): Data is abnormally high  Assessment & Plan:   1. PITUITARY ADENOMA 2. Hyperprolactinemia ([place])  - I have reviewed his   new and available  records and clinically evaluated the patient. - Based on these reviews, he has pituitary adenoma/hyperprolactinemia.  Review of his medical records indicate that he is either not responsive or only partially responding to bromocriptine. He is advised to increase cabergoline to 0.5 mg twice a week  with plan to repeat prolactin in 6 months.   He does have pituitary microadenoma, however with no tissue shift nor pressure symptoms. He will not need surgical intervention at this time, he is not a suitable surgical candidate.  -  He is encouraged to keep his cane and walk with his cane at all times.    For hypothyroidism: His free T4 and TSH are consistent with appropriate replacement.  He is advised to continue levothyroxine 75 mcg p.o. daily before breakfast.   - We discussed about the correct intake of his thyroid hormone, on empty stomach at fasting, with water, separated by at least 30 minutes from breakfast and other medications,  and separated by more than 4 hours from calcium, iron, multivitamins, acid reflux medications (PPIs). -Patient is made aware of  the fact that thyroid hormone replacement is needed for life, dose to be adjusted by periodic monitoring of thyroid function tests.   - he is advised to maintain close follow up with Dettinger, [name],  MD for primary care needs.   I spent 26 minutes in the care of the patient today including review of labs from Thyroid Function, CMP, and other relevant labs ; imaging/biopsy records (current and previous including abstractions from other facilities); face-to-face time discussing  his lab results and symptoms, medications doses, his options of short and long term treatment based on the latest standards of care / guidelines;   and documenting the encounter.  [name]  participated in the discussions, expressed understanding, and voiced agreement with the above plans.  All questions were answered to his satisfaction. he is encouraged to contact clinic should he have any questions or concerns prior to his return visit.   Follow up plan: Return in about 6 months (around 07/14/2022) for F/U with Pre-visit Labs.   Glade Lloyd, MD [facility] [address] [place], [place] [address] Phone: [phone]  Fax: [phone]     01/13/2022, 2:28 PM  This note was partially dictated with voice recognition software. Similar sounding words can be transcribed inadequately or may not  be corrected upon review.

## 2022-01-14 ENCOUNTER — Telehealth: Payer: Self-pay | Admitting: Family Medicine

## 2022-01-14 NOTE — Telephone Encounter (Signed)
Patient calling to check on a blood transfusion for low iron. He stated that he was told a couple of days ago that he would get a call to set this up. Thinks that the call came from [facility] but when he called them, they had no record of this. Please call back and advise.

## 2022-01-14 NOTE — Telephone Encounter (Signed)
Pt advised that the infusion has been approved on 11/15. Made pt aware to give it a little more time and if he had not heard anything by Monday at lunch to call us back. Pt understood.

## 2022-01-17 NOTE — Telephone Encounter (Signed)
Patient hadn't heard back on blood transfusion. Calling [facility] back to let there know.

## 2022-01-17 NOTE — Telephone Encounter (Signed)
Patient aware to call GI

## 2022-01-19 ENCOUNTER — Ambulatory Visit: Payer: Medicare Other | Admitting: Physician Assistant

## 2022-01-24 ENCOUNTER — Telehealth: Payer: Self-pay | Admitting: Internal Medicine

## 2022-01-24 NOTE — Telephone Encounter (Signed)
Pt last saw Tye Savoy will send to her nurse

## 2022-01-24 NOTE — Telephone Encounter (Signed)
Spoke with pt with pt and let him know that I contacted the infusion center about his iron infusion and Wynetta Fines stated that scheduling had tried to reach out last week and they left a voicemail on pt's phone. Pt stated he is sometimes forgetful and requested that I contact his son in law. Called pt's son in law and let him know that pt needs to be scheduled for an iron infusion and the infusion center will be contacting the pt. Ronalee Belts, pt's son in law, verbalized understanding.

## 2022-01-24 NOTE — Telephone Encounter (Signed)
Inbound call from patient son in law in regards  to patient stating he needs a blood transfusion and having labs done.

## 2022-01-25 NOTE — Telephone Encounter (Signed)
Please give son in law a call in regards to questions about infusions.  Thank you

## 2022-01-27 ENCOUNTER — Other Ambulatory Visit: Payer: Self-pay | Admitting: Pharmacy Technician

## 2022-01-27 NOTE — Telephone Encounter (Signed)
Spoke with pt's son in Sports coach. Pt son in law wanted to know if pt needed to do anything before iron infusion. Let son-in-law know that pt does not need to do anything to prepare for iron infusion. Pt just needs to show up for appointment and then 6 weeks after appointment he will need a CBC and IBC+ferritin level. Reminder placed to call pt 6 weeks after iron infusion for labs.

## 2022-01-28 ENCOUNTER — Encounter: Payer: Self-pay | Admitting: Family Medicine

## 2022-01-28 ENCOUNTER — Ambulatory Visit (INDEPENDENT_AMBULATORY_CARE_PROVIDER_SITE_OTHER): Payer: Medicare Other | Admitting: Family Medicine

## 2022-01-28 VITALS — BP 135/69 | HR 78 | Ht 67.0 in | Wt 155.0 lb

## 2022-01-28 DIAGNOSIS — D500 Iron deficiency anemia secondary to blood loss (chronic): Secondary | ICD-10-CM

## 2022-01-28 LAB — CBC WITH DIFFERENTIAL/PLATELET
Basophils Absolute: 0.2 10*3/uL (ref 0.0–0.2)
Basos: 2 %
EOS (ABSOLUTE): 0.4 10*3/uL (ref 0.0–0.4)
Eos: 3 %
Hematocrit: 31.9 % — ABNORMAL LOW (ref 37.5–51.0)
Hemoglobin: 10.3 g/dL — ABNORMAL LOW (ref 13.0–17.7)
Immature Grans (Abs): 0.4 10*3/uL — ABNORMAL HIGH (ref 0.0–0.1)
Immature Granulocytes: 4 %
Lymphocytes Absolute: 1.2 10*3/uL (ref 0.7–3.1)
Lymphs: 11 %
MCH: 29 pg (ref 26.6–33.0)
MCHC: 32.3 g/dL (ref 31.5–35.7)
MCV: 90 fL (ref 79–97)
Monocytes Absolute: 1.5 10*3/uL — ABNORMAL HIGH (ref 0.1–0.9)
Monocytes: 14 %
Neutrophils Absolute: 7 10*3/uL (ref 1.4–7.0)
Neutrophils: 66 %
Platelets: 313 10*3/uL (ref 150–450)
RBC: 3.55 x10E6/uL — ABNORMAL LOW (ref 4.14–5.80)
RDW: 15.7 % — ABNORMAL HIGH (ref 11.6–15.4)
WBC: 10.5 10*3/uL (ref 3.4–10.8)

## 2022-01-28 NOTE — Progress Notes (Signed)
BP 135/69   Pulse 78   Ht '5\' 7"'$  (1.702 m)   Wt 155 lb (70.3 kg)   SpO2 99%   BMI 24.28 kg/m    Subjective:   Patient ID: [name], male    DOB: 12-06-1932, 88 y.o.   MRN: [id number]  HPI: [name] is a 88 y.o. male presenting on 01/28/2022 for Medical Management of Chronic Issues and Anemia (1-14mf/u)   HPI Anemia recheck Patient had a history of GI bleed on a couple different occasions and has developed anemia due to.  We are monitoring his blood counts.  He has had the occasional dark stool but denies any bright red bleeding.  He is still feeling somewhat weak and more stumbling than it was before.  He also complains a little bit of congestion and coughing at night that is been going on over the past few weeks.  He is using his Symbicort but is not using Xopenex as often.  He did use some Mucinex but has not used anything else.  Relevant past medical, surgical, family and social history reviewed and updated as indicated. Interim medical history since our last visit reviewed. Allergies and medications reviewed and updated.  Review of Systems  Constitutional:  Negative for chills and fever.  Respiratory:  Negative for shortness of breath and wheezing.   Cardiovascular:  Negative for chest pain and leg swelling.  Musculoskeletal:  Negative for back pain and gait problem.  Skin:  Negative for rash.  Neurological:  Negative for dizziness, weakness and light-headedness.  All other systems reviewed and are negative.   Per HPI unless specifically indicated above   Allergies as of 01/28/2022       Reactions   Gadavist [gadobutrol] Hives   Prednisone    Agitation and hallucinations   Betadine [povidone Iodine] Other (See Comments)   Blisters    Lortab [hydrocodone-acetaminophen] Nausea And Vomiting   Penicillins Other (See Comments)   Lightheadedness  Has patient had a PCN reaction causing immediate rash, facial/tongue/throat swelling, SOB or lightheadedness  with hypotension: Yes Has patient had a PCN reaction causing severe rash involving mucus membranes or skin necrosis: No Has patient had a PCN reaction that required hospitalization No Has patient had a PCN reaction occurring within the last 10 years: No If all of the above answers are "NO", then may proceed with Cephalosporin use.   Zetia [ezetimibe] Other (See Comments)   Muscle weakness    Zocor [simvastatin] Nausea Only, Other (See Comments)   Muscle weakness         Medication List        Accurate as of January 28, 2022 11:22 AM. If you have any questions, ask your nurse or doctor.          STOP taking these medications    aspirin EC 81 MG tablet Stopped by: JFransisca KaufmannDettinger, MD       TAKE these medications    budesonide-formoterol 160-4.5 MCG/ACT inhaler Commonly known as: Symbicort Inhale 2 puffs into the lungs in the morning and at bedtime.   cabergoline 0.5 MG tablet Commonly known as: DOSTINEX Take 1 tablet (0.5 mg total) by mouth 2 (two) times a week.   calcium carbonate 1250 (500 Ca) MG tablet Commonly known as: OS-CAL - dosed in mg of elemental calcium Take 1 tablet by mouth daily with breakfast.   cholecalciferol 1000 units tablet Commonly known as: VITAMIN D Take 1,000 Units by mouth daily.  cyanocobalamin 1000 MCG tablet Commonly known as: VITAMIN B12 Take 1,000 mcg by mouth daily.   diclofenac Sodium 1 % Gel Commonly known as: VOLTAREN Apply 2 g topically 4 (four) times daily.   diphenhydrAMINE 25 MG tablet Commonly known as: BENADRYL Take 25 mg by mouth every 6 (six) hours as needed for itching.   diphenhydramine-acetaminophen 25-500 MG Tabs tablet Commonly known as: TYLENOL PM Take 1/2 tablet by mouth at bedtime as needed for sleep   FeroSul 325 (65 FE) MG tablet Generic drug: ferrous sulfate TAKE (1) TABLET DAILY WITH BREAKFAST.   Flutter Devi Twice a day and prn as needed, may increase if feeling worse   GaviLAX 17  GM/SCOOP powder Generic drug: polyethylene glycol powder DISSOLVE 17 GM SCOOP IN 8 OZ. OF WATER AND DRINK ONCE DAILY AS NEEDED FOR CONSTIPATION   guaiFENesin 600 MG 12 hr tablet Commonly known as: Mucinex Take 1 tablet (600 mg total) by mouth 2 (two) times daily.   levalbuterol 45 MCG/ACT inhaler Commonly known as: XOPENEX HFA Inhale 1 puff into the lungs every 4 (four) hours as needed for wheezing.   levothyroxine 75 MCG tablet Commonly known as: SYNTHROID Take 1 tablet (75 mcg total) by mouth daily before breakfast.   magnesium oxide 400 (241.3 Mg) MG tablet Commonly known as: MAG-OX Take 1 tablet (400 mg total) by mouth 2 (two) times daily.   Melatonin 5 MG Caps Take 1 capsule by mouth at bedtime.   Metamucil 28 % packet Generic drug: psyllium Take 1 packet by mouth daily.   metoprolol succinate 25 MG 24 hr tablet Commonly known as: Toprol XL Take 1 tablet (25 mg total) by mouth daily.   naphazoline-pheniramine 0.025-0.3 % ophthalmic solution Commonly known as: NAPHCON-A Place 1 drop into the left eye 2 (two) times daily as needed for irritation or allergies.   nitroGLYCERIN 0.4 MG SL tablet Commonly known as: NITROSTAT Place 1 tablet (0.4 mg total) under the tongue every 5 (five) minutes as needed for chest pain.   pantoprazole 40 MG tablet Commonly known as: PROTONIX Take 1 tablet (40 mg total) by mouth 2 (two) times daily.   sodium chloride HYPERTONIC 3 % nebulizer solution Take by nebulization as needed for other.   sucralfate 1 g tablet Commonly known as: CARAFATE Take 1 tablet (1 g total) by mouth 4 (four) times daily -  with meals and at bedtime.   tamsulosin 0.4 MG Caps capsule Commonly known as: FLOMAX Take 1 capsule (0.4 mg total) by mouth daily after supper.   traMADol 50 MG tablet Commonly known as: ULTRAM Take 1 tablet (50 mg total) by mouth 2 (two) times daily as needed.   triamcinolone cream 0.1 % Commonly known as: KENALOG Apply 1  application topically 2 (two) times daily.   Turmeric 500 MG Caps Take 1 capsule by mouth daily.   Vitamin C 500 MG Caps Take 1 capsule by mouth daily.   vitamin E 180 MG (400 UNITS) capsule Take 400 Units by mouth daily.         Objective:   BP 135/69   Pulse 78   Ht '5\' 7"'$  (1.702 m)   Wt 155 lb (70.3 kg)   SpO2 99%   BMI 24.28 kg/m   Wt Readings from Last 3 Encounters:  01/28/22 155 lb (70.3 kg)  01/13/22 157 lb 9.6 oz (71.5 kg)  01/07/22 153 lb (69.4 kg)    Physical Exam Vitals and nursing note reviewed.  Constitutional:  General: He is not in acute distress.    Appearance: He is well-developed. He is not diaphoretic.  Eyes:     General: No scleral icterus.    Conjunctiva/sclera: Conjunctivae normal.  Neck:     Thyroid: No thyromegaly.  Cardiovascular:     Rate and Rhythm: Normal rate and regular rhythm.     Heart sounds: Normal heart sounds. No murmur heard. Pulmonary:     Effort: Pulmonary effort is normal. No respiratory distress.     Breath sounds: Normal breath sounds. No wheezing.  Abdominal:     General: Abdomen is flat. Bowel sounds are normal. There is no distension.     Palpations: Abdomen is soft.     Tenderness: There is no abdominal tenderness. There is no guarding or rebound.  Musculoskeletal:        General: Normal range of motion.     Cervical back: Neck supple.  Lymphadenopathy:     Cervical: No cervical adenopathy.  Skin:    General: Skin is warm and dry.     Findings: No rash.  Neurological:     Mental Status: He is alert and oriented to person, place, and time.     Coordination: Coordination normal.  Psychiatric:        Behavior: Behavior normal.       Assessment & Plan:   Problem List Items Addressed This Visit       Other   Anemia due to chronic blood loss - Primary   Relevant Orders   CBC with Differential/Platelet  Will check blood counts again to see where he is.  Recommended he use his Xopenex more  frequently for his congestion and use Robitussin cough syrup.  Let us know if anything worsens  Follow up plan: Return in about 2 months (around 03/31/2022), or if symptoms worsen or fail to improve, for Anemia recheck.  Counseling provided for all of the vaccine components Orders Placed This Encounter  Procedures   CBC with Differential/Platelet    Caryl Pina, MD [facility] 01/28/2022, 11:22 AM

## 2022-01-31 ENCOUNTER — Ambulatory Visit: Payer: Medicare Other

## 2022-01-31 ENCOUNTER — Ambulatory Visit (INDEPENDENT_AMBULATORY_CARE_PROVIDER_SITE_OTHER): Payer: Medicare Other

## 2022-01-31 VITALS — BP 120/74 | HR 63 | Temp 97.7°F | Resp 16 | Ht 66.0 in | Wt 155.2 lb

## 2022-01-31 DIAGNOSIS — K921 Melena: Secondary | ICD-10-CM | POA: Diagnosis not present

## 2022-01-31 DIAGNOSIS — D500 Iron deficiency anemia secondary to blood loss (chronic): Secondary | ICD-10-CM | POA: Diagnosis not present

## 2022-01-31 MED ORDER — ACETAMINOPHEN 325 MG PO TABS
650.0000 mg | ORAL_TABLET | Freq: Once | ORAL | Status: AC
  Administered 2022-01-31: 650 mg via ORAL
  Filled 2022-01-31: qty 2

## 2022-01-31 MED ORDER — SODIUM CHLORIDE 0.9 % IV SOLN
510.0000 mg | Freq: Once | INTRAVENOUS | Status: AC
  Administered 2022-01-31: 510 mg via INTRAVENOUS
  Filled 2022-01-31: qty 17

## 2022-01-31 MED ORDER — DIPHENHYDRAMINE HCL 25 MG PO CAPS
25.0000 mg | ORAL_CAPSULE | Freq: Once | ORAL | Status: AC
  Administered 2022-01-31: 25 mg via ORAL
  Filled 2022-01-31: qty 1

## 2022-01-31 NOTE — Progress Notes (Signed)
Diagnosis: Iron Deficiency Anemia  Provider:  Marshell Garfinkel MD  Procedure: Infusion  IV Type: Peripheral, IV Location: R Antecubital  Feraheme (Ferumoxytol), Dose: 510 mg  Infusion Start Time: 7741  Infusion Stop Time: 1200  Post Infusion IV Care: Observation period completed and Peripheral IV Discontinued  Discharge: Condition: Good, Destination: Home . AVS provided to patient.   Performed by:  Koren Shiver, RN

## 2022-02-02 ENCOUNTER — Ambulatory Visit: Payer: Medicare Other | Admitting: Internal Medicine

## 2022-02-07 ENCOUNTER — Observation Stay (HOSPITAL_BASED_OUTPATIENT_CLINIC_OR_DEPARTMENT_OTHER): Payer: Medicare Other

## 2022-02-07 ENCOUNTER — Emergency Department (HOSPITAL_COMMUNITY): Payer: Medicare Other

## 2022-02-07 ENCOUNTER — Observation Stay (HOSPITAL_COMMUNITY)
Admission: EM | Admit: 2022-02-07 | Discharge: 2022-02-08 | Disposition: A | Discharge status: Home / Self Care | Payer: Medicare Other | Attending: Family Medicine | Admitting: Family Medicine

## 2022-02-07 ENCOUNTER — Other Ambulatory Visit: Payer: Self-pay

## 2022-02-07 ENCOUNTER — Encounter (HOSPITAL_COMMUNITY): Payer: Self-pay

## 2022-02-07 ENCOUNTER — Ambulatory Visit: Payer: Medicare Other

## 2022-02-07 DIAGNOSIS — I5032 Chronic diastolic (congestive) heart failure: Secondary | ICD-10-CM | POA: Insufficient documentation

## 2022-02-07 DIAGNOSIS — K2100 Gastro-esophageal reflux disease with esophagitis, without bleeding: Secondary | ICD-10-CM | POA: Diagnosis present

## 2022-02-07 DIAGNOSIS — D72829 Elevated white blood cell count, unspecified: Secondary | ICD-10-CM | POA: Diagnosis not present

## 2022-02-07 DIAGNOSIS — I480 Paroxysmal atrial fibrillation: Secondary | ICD-10-CM | POA: Diagnosis present

## 2022-02-07 DIAGNOSIS — I77819 Aortic ectasia, unspecified site: Secondary | ICD-10-CM | POA: Diagnosis present

## 2022-02-07 DIAGNOSIS — Z79899 Other long term (current) drug therapy: Secondary | ICD-10-CM | POA: Insufficient documentation

## 2022-02-07 DIAGNOSIS — R7989 Other specified abnormal findings of blood chemistry: Secondary | ICD-10-CM

## 2022-02-07 DIAGNOSIS — I77810 Thoracic aortic ectasia: Secondary | ICD-10-CM | POA: Diagnosis present

## 2022-02-07 DIAGNOSIS — J45909 Unspecified asthma, uncomplicated: Secondary | ICD-10-CM | POA: Insufficient documentation

## 2022-02-07 DIAGNOSIS — R079 Chest pain, unspecified: Secondary | ICD-10-CM | POA: Diagnosis present

## 2022-02-07 DIAGNOSIS — I21A1 Myocardial infarction type 2: Secondary | ICD-10-CM | POA: Diagnosis not present

## 2022-02-07 DIAGNOSIS — D539 Nutritional anemia, unspecified: Secondary | ICD-10-CM | POA: Diagnosis not present

## 2022-02-07 DIAGNOSIS — Z85828 Personal history of other malignant neoplasm of skin: Secondary | ICD-10-CM | POA: Insufficient documentation

## 2022-02-07 DIAGNOSIS — Z1152 Encounter for screening for COVID-19: Secondary | ICD-10-CM | POA: Diagnosis not present

## 2022-02-07 DIAGNOSIS — E221 Hyperprolactinemia: Secondary | ICD-10-CM | POA: Diagnosis not present

## 2022-02-07 DIAGNOSIS — Z955 Presence of coronary angioplasty implant and graft: Secondary | ICD-10-CM | POA: Insufficient documentation

## 2022-02-07 DIAGNOSIS — D352 Benign neoplasm of pituitary gland: Secondary | ICD-10-CM | POA: Diagnosis not present

## 2022-02-07 DIAGNOSIS — R0789 Other chest pain: Principal | ICD-10-CM | POA: Insufficient documentation

## 2022-02-07 DIAGNOSIS — N400 Enlarged prostate without lower urinary tract symptoms: Secondary | ICD-10-CM | POA: Diagnosis present

## 2022-02-07 DIAGNOSIS — W19XXXA Unspecified fall, initial encounter: Secondary | ICD-10-CM

## 2022-02-07 DIAGNOSIS — I2510 Atherosclerotic heart disease of native coronary artery without angina pectoris: Secondary | ICD-10-CM | POA: Diagnosis not present

## 2022-02-07 DIAGNOSIS — E039 Hypothyroidism, unspecified: Secondary | ICD-10-CM | POA: Diagnosis not present

## 2022-02-07 DIAGNOSIS — I4891 Unspecified atrial fibrillation: Secondary | ICD-10-CM | POA: Diagnosis present

## 2022-02-07 DIAGNOSIS — J441 Chronic obstructive pulmonary disease with (acute) exacerbation: Secondary | ICD-10-CM | POA: Diagnosis not present

## 2022-02-07 DIAGNOSIS — I5030 Unspecified diastolic (congestive) heart failure: Secondary | ICD-10-CM

## 2022-02-07 DIAGNOSIS — E785 Hyperlipidemia, unspecified: Secondary | ICD-10-CM | POA: Diagnosis present

## 2022-02-07 DIAGNOSIS — I7121 Aneurysm of the ascending aorta, without rupture: Secondary | ICD-10-CM | POA: Diagnosis present

## 2022-02-07 DIAGNOSIS — Z96643 Presence of artificial hip joint, bilateral: Secondary | ICD-10-CM | POA: Diagnosis not present

## 2022-02-07 DIAGNOSIS — J449 Chronic obstructive pulmonary disease, unspecified: Secondary | ICD-10-CM | POA: Diagnosis present

## 2022-02-07 LAB — ECHOCARDIOGRAM COMPLETE
Area-P 1/2: 5.44 cm2
Calc EF: 61 %
Height: 66 in
MV M vel: 4.29 m/s
MV Peak grad: 73.6 mmHg
Radius: 0.3 cm
S' Lateral: 2.3 cm
Single Plane A2C EF: 59.7 %
Single Plane A4C EF: 63 %
Weight: 2400 oz

## 2022-02-07 LAB — RESP PANEL BY RT-PCR (RSV, FLU A&B, COVID)  RVPGX2
Influenza A by PCR: NEGATIVE
Influenza B by PCR: NEGATIVE
Resp Syncytial Virus by PCR: POSITIVE — AB
SARS Coronavirus 2 by RT PCR: NEGATIVE

## 2022-02-07 LAB — CBC
HCT: 31.7 % — ABNORMAL LOW (ref 39.0–52.0)
Hemoglobin: 10.1 g/dL — ABNORMAL LOW (ref 13.0–17.0)
MCH: 28.7 pg (ref 26.0–34.0)
MCHC: 31.9 g/dL (ref 30.0–36.0)
MCV: 90.1 fL (ref 80.0–100.0)
Platelets: 220 10*3/uL (ref 150–400)
RBC: 3.52 MIL/uL — ABNORMAL LOW (ref 4.22–5.81)
RDW: 18 % — ABNORMAL HIGH (ref 11.5–15.5)
WBC: 15.8 10*3/uL — ABNORMAL HIGH (ref 4.0–10.5)
nRBC: 0 % (ref 0.0–0.2)

## 2022-02-07 LAB — BASIC METABOLIC PANEL
Anion gap: 10 (ref 5–15)
BUN: 13 mg/dL (ref 8–23)
CO2: 19 mmol/L — ABNORMAL LOW (ref 22–32)
Calcium: 8.3 mg/dL — ABNORMAL LOW (ref 8.9–10.3)
Chloride: 106 mmol/L (ref 98–111)
Creatinine, Ser: 1.17 mg/dL (ref 0.61–1.24)
GFR, Estimated: >60 mL/min (ref >60)
Glucose, Bld: 123 mg/dL — ABNORMAL HIGH (ref 70–99)
Potassium: 3.8 mmol/L (ref 3.5–5.1)
Sodium: 135 mmol/L (ref 135–145)

## 2022-02-07 LAB — MAGNESIUM: Magnesium: 1.7 mg/dL (ref 1.7–2.4)

## 2022-02-07 LAB — TROPONIN I (HIGH SENSITIVITY)
Troponin I (High Sensitivity): 38 ng/L — ABNORMAL HIGH (ref <18)
Troponin I (High Sensitivity): 113 ng/L (ref <18)
Troponin I (High Sensitivity): 139 ng/L (ref <18)

## 2022-02-07 LAB — TSH: TSH: 1.676 u[IU]/mL (ref 0.350–4.500)

## 2022-02-07 MED ORDER — SODIUM CHLORIDE 0.9 % IV SOLN: 250.0000 mL | INTRAVENOUS | Status: DC | PRN

## 2022-02-07 MED ORDER — SODIUM CHLORIDE 0.9% FLUSH: 3.0000 mL | INTRAVENOUS | Status: DC | PRN

## 2022-02-07 MED ORDER — SODIUM CHLORIDE 0.9 % IV BOLUS
500.0000 mL | Freq: Once | INTRAVENOUS | Status: AC
  Administered 2022-02-07: 500 mL via INTRAVENOUS

## 2022-02-07 MED ORDER — TAMSULOSIN HCL 0.4 MG PO CAPS
0.4000 mg | ORAL_CAPSULE | Freq: Every day | ORAL | Status: DC
  Administered 2022-02-07: 0.4 mg via ORAL
  Filled 2022-02-07: qty 1

## 2022-02-07 MED ORDER — PSYLLIUM 95 % PO PACK
1.0000 | PACK | Freq: Every day | ORAL | Status: DC
  Administered 2022-02-07: 1 via ORAL
  Filled 2022-02-07 (×2): qty 1

## 2022-02-07 MED ORDER — LEVOTHYROXINE SODIUM 75 MCG PO TABS
75.0000 ug | ORAL_TABLET | Freq: Every day | ORAL | Status: DC
  Filled 2022-02-07: qty 1

## 2022-02-07 MED ORDER — MOMETASONE FURO-FORMOTEROL FUM 200-5 MCG/ACT IN AERO
2.0000 | INHALATION_SPRAY | Freq: Two times a day (BID) | RESPIRATORY_TRACT | Status: DC
  Filled 2022-02-07: qty 8.8

## 2022-02-07 MED ORDER — MAGNESIUM OXIDE -MG SUPPLEMENT 400 (240 MG) MG PO TABS
400.0000 mg | ORAL_TABLET | Freq: Two times a day (BID) | ORAL | Status: DC
  Administered 2022-02-07 (×2): 400 mg via ORAL
  Filled 2022-02-07 (×3): qty 1

## 2022-02-07 MED ORDER — SODIUM CHLORIDE 0.9% FLUSH
3.0000 mL | Freq: Two times a day (BID) | INTRAVENOUS | Status: DC
  Administered 2022-02-07 (×2): 3 mL via INTRAVENOUS

## 2022-02-07 MED ORDER — GUAIFENESIN-DM 100-10 MG/5ML PO SYRP
5.0000 mL | ORAL_SOLUTION | ORAL | Status: DC | PRN
  Administered 2022-02-07 – 2022-02-08 (×3): 5 mL via ORAL
  Filled 2022-02-07 (×4): qty 5

## 2022-02-07 MED ORDER — GUAIFENESIN ER 600 MG PO TB12
600.0000 mg | ORAL_TABLET | Freq: Two times a day (BID) | ORAL | Status: DC
  Administered 2022-02-07 (×2): 600 mg via ORAL
  Filled 2022-02-07 (×3): qty 1

## 2022-02-07 MED ORDER — METOPROLOL SUCCINATE ER 25 MG PO TB24
25.0000 mg | ORAL_TABLET | Freq: Every day | ORAL | Status: DC
  Administered 2022-02-07: 25 mg via ORAL
  Filled 2022-02-07 (×2): qty 1

## 2022-02-07 MED ORDER — TRAMADOL HCL 50 MG PO TABS
50.0000 mg | ORAL_TABLET | Freq: Two times a day (BID) | ORAL | Status: DC | PRN
  Administered 2022-02-07: 50 mg via ORAL
  Filled 2022-02-07: qty 1

## 2022-02-07 MED ORDER — FERROUS SULFATE 325 (65 FE) MG PO TABS
325.0000 mg | ORAL_TABLET | Freq: Every day | ORAL | Status: DC
  Filled 2022-02-07: qty 1

## 2022-02-07 MED ORDER — DIPHENHYDRAMINE HCL 25 MG PO CAPS: 25.0000 mg | ORAL_CAPSULE | Freq: Every evening | ORAL | Status: DC | PRN

## 2022-02-07 MED ORDER — CABERGOLINE 0.5 MG PO TABS: 0.5000 mg | ORAL_TABLET | ORAL | Status: DC

## 2022-02-07 MED ORDER — SUCRALFATE 1 G PO TABS
1.0000 g | ORAL_TABLET | Freq: Three times a day (TID) | ORAL | Status: DC
  Administered 2022-02-07 (×2): 1 g via ORAL
  Filled 2022-02-07 (×6): qty 1

## 2022-02-07 MED ORDER — LEVALBUTEROL HCL 0.63 MG/3ML IN NEBU: 0.6300 mg | INHALATION_SOLUTION | RESPIRATORY_TRACT | Status: DC | PRN

## 2022-02-07 MED ORDER — PANTOPRAZOLE SODIUM 40 MG PO TBEC
40.0000 mg | DELAYED_RELEASE_TABLET | Freq: Two times a day (BID) | ORAL | Status: DC
  Administered 2022-02-07 (×2): 40 mg via ORAL
  Filled 2022-02-07 (×3): qty 1

## 2022-02-07 MED ORDER — NITROGLYCERIN 0.4 MG SL SUBL: 0.4000 mg | SUBLINGUAL_TABLET | SUBLINGUAL | Status: DC | PRN

## 2022-02-07 MED ORDER — ENOXAPARIN SODIUM 40 MG/0.4ML IJ SOSY
40.0000 mg | PREFILLED_SYRINGE | INTRAMUSCULAR | Status: DC
  Administered 2022-02-07: 40 mg via SUBCUTANEOUS
  Filled 2022-02-07 (×2): qty 0.4

## 2022-02-07 MED ORDER — MELATONIN 5 MG PO TABS
5.0000 mg | ORAL_TABLET | Freq: Every day | ORAL | Status: DC
  Administered 2022-02-07: 5 mg via ORAL
  Filled 2022-02-07: qty 1

## 2022-02-07 NOTE — Assessment & Plan Note (Signed)
Continue protonix BID and carafate

## 2022-02-07 NOTE — Progress Notes (Signed)
PT Cancellation Note  Patient Details Name: [name] MRN: [id number] DOB: Oct 03, 1932   Cancelled Treatment:    Reason Eval/Treat Not Completed: Medical issues which prohibited therapy - troponin continuing to elevate, await further cards input.  Stacie Glaze, PT DPT Acute Rehabilitation Services Pager [phone]  Office [phone]    Louis Matte 02/07/2022, 3:33 PM

## 2022-02-07 NOTE — Assessment & Plan Note (Addendum)
At baseline of 10 Trending upward with oral iron and iron infusion s/p GI bleed in August and October of this year  Continue oral iron

## 2022-02-07 NOTE — Assessment & Plan Note (Signed)
Followed by cardiology, last note in 11/23-This was 43 mm in April of last 2022.  He has not wanted surgery.  I will not plan further imaging.

## 2022-02-07 NOTE — ED Provider Notes (Signed)
[facility] Emergency Department Provider Note MRN:  [id number]  Arrival date & time: 02/07/22     Chief Complaint   Chest Pain   History of Present Illness   [name] is a 88 y.o. year-old male with a history of CAD, COPD, thoracic aortic aneurysm presenting to the ED with chief complaint of chest pain.  Patient woke up with mild chest pressure.  He got out of bed and then he stumbled forward and fell.  Denies any injuries from the fall, no head trauma.  His chest pain has since resolved.  He has been feeling some palpitations or elevated heart rate on occasion over the past few days.  Denies fever, no cough or cold-like symptoms, no shortness of breath, no abdominal pain.  Review of Systems  A thorough review of systems was obtained and all systems are negative except as noted in the HPI and PMH.   Patient's Health History    Past Medical History:  Diagnosis Date   Abnormality of gait 06/07/2013   Anemia    Aneurysm ([place])    on assending aorta, currently watching it, Dr Cyndia Bent   Anxiety    Arthritis    Asthma    Atrial fibrillation ([place])    B12 deficiency    Barrett's esophagus    CAD (coronary artery disease) 03/2006   3.0 x 20 mm TAXUS Perseus DES to the LAD; 01/2007  L main 30%, oLAD 50%, pLAD stent ok, CFX 80%, OM 60%, pRCA 60%, mRCA 70%, oPDA 90%; med rx    Cancer ([place])    skin cancer    Cataract    bilateral removal of cateracts   COLONIC POLYPS 07/17/2008   Qualifier: Diagnosis of  By: Lovette Cliche, CNA, Christy     Complication of anesthesia     no issues,but pt prefers spinal due to Pulmonary problems   COPD (chronic obstructive pulmonary disease) ([place])    oxygen  on standby in home.   Depression    Diverticulosis    Emphysema    Enlarged prostate with urinary retention    Esophageal stenosis    Gastric ulcer    GERD (gastroesophageal reflux disease)    Hiatal hernia    Hypothyroidism    Neuropathy    Osteoporosis    Pituitary  macroadenoma (HCC)    Restless legs syndrome (RLS)    Tubular adenoma of colon    UGIB (upper gastrointestinal bleed) 03/2013   EGD w/ large ulcer at GE junction    Past Surgical History:  Procedure Laterality Date   ABDOMINAL HERNIA REPAIR   2008   BIOPSY  06/13/2020   Procedure: BIOPSY;  Surgeon: Harvel Quale, MD;  [facility];  Service: Gastroenterology;;  gastric   BIOPSY  12/04/2021   Procedure: BIOPSY;  Surgeon: Juanita Craver, MD;  [facility]: [facility];  Service: Gastroenterology;;   CARDIAC CATHETERIZATION  01/2007   L main 30%, oLAD 50%,  pLAD stent ok, CFX 80%, OM 60%, pRCA 60%, mRCA 70%, oPDA 90%; med rx   cataract extraction both eyes     COLONOSCOPY     COLONOSCOPY WITH PROPOFOL N/A 06/13/2020   Procedure: COLONOSCOPY WITH PROPOFOL;  Surgeon: Harvel Quale, MD;  [facility];  Service: Gastroenterology;  Laterality: N/A;   COLONOSCOPY WITH PROPOFOL N/A 12/04/2021   Procedure: COLONOSCOPY WITH PROPOFOL;  Surgeon: Juanita Craver, MD;  [facility];  Service: Gastroenterology;  Laterality: N/A;   CORONARY ANGIOPLASTY  CORONARY STENT PLACEMENT  03/2006   3.0 x 20 mm TAXUS Perseus DES to the LAD   CYSTOSCOPY N/A 06/10/2015   Procedure: CYSTOSCOPY FULGRATION OF BLEEDING,  electovapor resection;  Surgeon: Irine Seal, MD;  [facility];  Service: Urology;  Laterality: N/A;   CYSTOSCOPY WITH INSERTION OF UROLIFT N/A 06/01/2015   Procedure: CYSTOSCOPY WITH INSERTION OF UROLIFT x4;  Surgeon: Irine Seal, MD;  [facility];  Service: Urology;  Laterality: N/A;   ESOPHAGOGASTRODUODENOSCOPY N/A 04/20/2013   Procedure: ESOPHAGOGASTRODUODENOSCOPY (EGD);  Surgeon: Inda Castle, MD;  [facility];  Service: Endoscopy;  Laterality: N/A;   ESOPHAGOGASTRODUODENOSCOPY N/A 03/25/2016   Procedure: ESOPHAGOGASTRODUODENOSCOPY (EGD);  Surgeon: Irene Shipper, MD;  [facility];  Service: Endoscopy;  Laterality: N/A;    ESOPHAGOGASTRODUODENOSCOPY (EGD) WITH PROPOFOL N/A 10/13/2015   Procedure: ESOPHAGOGASTRODUODENOSCOPY (EGD) WITH PROPOFOL;  Surgeon: Jerene Bears, MD;  [facility];  Service: Gastroenterology;  Laterality: N/A;   ESOPHAGOGASTRODUODENOSCOPY (EGD) WITH PROPOFOL N/A 06/13/2020   Procedure: ESOPHAGOGASTRODUODENOSCOPY (EGD) WITH PROPOFOL;  Surgeon: Harvel Quale, MD;  [facility];  Service: Gastroenterology;  Laterality: N/A;   ESOPHAGOGASTRODUODENOSCOPY (EGD) WITH PROPOFOL N/A 10/02/2021   Procedure: ESOPHAGOGASTRODUODENOSCOPY (EGD) WITH PROPOFOL;  Surgeon: Daryel November, MD;  [facility];  Service: Gastroenterology;  Laterality: N/A;   ESOPHAGOGASTRODUODENOSCOPY (EGD) WITH PROPOFOL N/A 12/03/2021   Procedure: ESOPHAGOGASTRODUODENOSCOPY (EGD) WITH PROPOFOL;  Surgeon: Ladene Artist, MD;  [facility];  Service: Gastroenterology;  Laterality: N/A;   FOOT SURGERY  1994 left, 2002 right foot   bilateral foot reconstruciton   FOOT SURGERY     reconstruction of both feet- no retained hardware.   HERNIA REPAIR     HIATAL HERNIA REPAIR  01-04-2008   HOT HEMOSTASIS N/A 10/02/2021   Procedure: HOT HEMOSTASIS (ARGON PLASMA COAGULATION/BICAP);  Surgeon: Daryel November, MD;  [facility];  Service: Gastroenterology;  Laterality: N/A;   JOINT REPLACEMENT     MELANOMA EXCISION  2019   POLYPECTOMY     POLYPECTOMY  06/13/2020   Procedure: POLYPECTOMY;  Surgeon: Harvel Quale, MD;  [facility];  Service: Gastroenterology;;  ascending,    POLYPECTOMY  12/04/2021   Procedure: POLYPECTOMY;  Surgeon: Juanita Craver, MD;  [facility];  Service: Gastroenterology;;   PROSTATE SURGERY     x 2   SAVORY DILATION N/A 03/25/2016   Procedure: SAVORY DILATION;  Surgeon: Irene Shipper, MD;  [facility];  Service: Endoscopy;  Laterality: N/A;   TOTAL HIP ARTHROPLASTY  07/21/2011   Procedure: TOTAL HIP ARTHROPLASTY  ANTERIOR APPROACH;  Surgeon: Mauri Pole, MD;  [facility];  Service: Orthopedics;  Laterality: Left;   TOTAL HIP ARTHROPLASTY Right 09/07/2012   Procedure: RIGHT TOTAL HIP ARTHROPLASTY ANTERIOR APPROACH;  Surgeon: Mcarthur Rossetti, MD;  [facility];  Service: Orthopedics;  Laterality: Right;   TRANSURETHRAL RESECTION OF BLADDER NECK N/A 11/04/2015   Procedure: RESECTION OF BLADDER NECK;  Surgeon: Cleon Gustin, MD;  [facility];  Service: Urology;  Laterality: N/A;   TRANSURETHRAL RESECTION OF PROSTATE N/A 11/04/2015   Procedure: TRANSURETHRAL RESECTION OF THE PROSTATE (TURP); REMOVAL OF UROLIFT IMPLANTS X THREE;  Surgeon: Cleon Gustin, MD;  [facility];  Service: Urology;  Laterality: N/A;   UPPER GASTROINTESTINAL ENDOSCOPY  12/2013   Dr Hilarie Fredrickson, gastritis   VIDEO BRONCHOSCOPY Bilateral 02/01/2017   Procedure: VIDEO BRONCHOSCOPY WITHOUT FLUORO;  Surgeon: Tanda Rockers, MD;  [facility];  Service: Cardiopulmonary;  Laterality: Bilateral;    Family History  Problem Relation Age of Onset   Heart disease Sister    Heart failure Father    Other Mother        phebitis related to Seith's birth, he was 57 weeks old when she died   Colon cancer Neg Hx    Esophageal cancer Neg Hx    Rectal cancer Neg Hx    Stomach cancer Neg Hx    Colon polyps Neg Hx     Social History   Socioeconomic History   Marital status: Widowed    Spouse name: Not on file   Number of children: 2   Years of education: 14   Highest education level: Associate degree: occupational, Hotel manager, or vocational program  Occupational History   Occupation: retired  Tobacco Use   Smoking status: Never   Smokeless tobacco: Never  Vaping Use   Vaping Use: Never used  Substance and Sexual Activity   Alcohol use: No   Drug use: No   Sexual activity: Not Currently    Birth control/protection: None  Other Topics Concern   Not on file  Social History Narrative   Patient drinks  caffeine a few times a week.   Patient is right handed.   Social Determinants of Health   Financial Resource Strain: Low Risk  (02/08/2021)   Overall Financial Resource Strain (CARDIA)    Difficulty of Paying Living Expenses: Not hard at all  Food Insecurity: No Food Insecurity (12/02/2021)   Hunger Vital Sign    Worried About Running Out of Food in the Last Year: Never true    Ran Out of Food in the Last Year: Never true  Transportation Needs: No Transportation Needs (12/02/2021)   PRAPARE - Hydrologist (Medical): No    Lack of Transportation (Non-Medical): No  Physical Activity: Insufficiently Active (02/08/2021)   Exercise Vital Sign    Days of Exercise per Week: 7 days    Minutes of Exercise per Session: 20 min  Stress: No Stress Concern Present (02/08/2021)   [place]    Feeling of Stress : Not at all  Social Connections: Moderately Integrated (02/08/2021)   Social Connection and Isolation Panel [NHANES]    Frequency of Communication with Friends and Family: More than three times a week    Frequency of Social Gatherings with Friends and Family: More than three times a week    Attends Religious Services: More than 4 times per year    Active Member of [employer] or Organizations: Yes    Attends Archivist Meetings: More than 4 times per year    Marital Status: Widowed  Intimate Partner Violence: Not At Risk (12/02/2021)   Humiliation, Afraid, Rape, and Kick questionnaire    Fear of Current or Ex-Partner: No    Emotionally Abused: No    Physically Abused: No    Sexually Abused: No     Physical Exam   Vitals:   02/07/22 0600 02/07/22 0615  BP: 104/65 109/71  Pulse: 84 (!) 128  Resp: (!) 34 (!) 21  Temp:    SpO2: 93% 94%    CONSTITUTIONAL: Well-appearing, NAD NEURO/PSYCH:  Alert and oriented x 3, no focal deficits EYES:  eyes equal and reactive ENT/NECK:  no LAD, no  JVD CARDIO: Regular rate, well-perfused, normal S1 and S2 PULM:  CTAB no wheezing or rhonchi GI/GU:  non-distended,  non-tender MSK/SPINE:  No gross deformities, no edema SKIN:  no rash, atraumatic   *Additional and/or pertinent findings included in MDM below  Diagnostic and Interventional Summary    EKG Interpretation  Date/Time:  Monday February 07 2022 04:26:59 EST Ventricular Rate:  102 PR Interval:    QRS Duration: 102 QT Interval:  342 QTC Calculation: 446 R Axis:   69 Text Interpretation: Atrial fibrillation RSR' in V1 or V2, right VCD or RVH Confirmed by Gerlene Fee [id number]) on 02/07/2022 4:38:57 AM       Labs Reviewed  CBC - Abnormal; Notable for the following components:      Result Value   WBC 15.8 (*)    RBC 3.52 (*)    Hemoglobin 10.1 (*)    HCT 31.7 (*)    RDW 18.0 (*)    All other components within normal limits  BASIC METABOLIC PANEL - Abnormal; Notable for the following components:   CO2 19 (*)    Glucose, Bld 123 (*)    Calcium 8.3 (*)    All other components within normal limits  TROPONIN I (HIGH SENSITIVITY) - Abnormal; Notable for the following components:   Troponin I (High Sensitivity) 38 (*)    All other components within normal limits  TROPONIN I (HIGH SENSITIVITY)    DG Chest Port 1 View  Final Result      Medications  sodium chloride 0.9 % bolus 500 mL (0 mLs Intravenous Stopped 02/07/22 8119)     Procedures  /  Critical Care Procedures  ED Course and Medical Decision Making  Initial Impression and Ddx Differential diagnosis includes symptomatic palpitations from A-fib, ACS, overall doubt changed patient's aortic aneurysm given his well-appearing nature and the resolved pain.  Doubt PE.  Past medical/surgical history that increases complexity of ED encounter: A-fib, not anticoagulated  Interpretation of Diagnostics I personally reviewed the EKG and my interpretation is as follows: A-fib  Labs reveal no significant blood count  or electrolyte disturbance.  First troponin mildly elevated.  Patient Reassessment and Ultimate Disposition/Management     Plan is for hospitalist admission.  Patient management required discussion with the following services or consulting groups:  Hospitalist Service  Complexity of Problems Addressed Acute illness or injury that poses threat of life of bodily function  Additional Data Reviewed and Analyzed Further history obtained from: Prior labs/imaging results  Additional Factors Impacting ED Encounter Risk Consideration of hospitalization  Barth Kirks. [place]onia Small, [place] [email or web address]  Final Clinical Impressions(s) / ED Diagnoses     ICD-10-CM   1. Chest pain, unspecified type  R07.9     2. Fall, initial encounter  [email or web address].[facility]       ED Discharge Orders     None        Discharge Instructions Discussed with and Provided to Patient:   Discharge Instructions   None      Maudie Flakes, MD 02/07/22 [phone]

## 2022-02-07 NOTE — Plan of Care (Signed)
  Problem: Education: Goal: Understanding of medication regimen will improve Outcome: Progressing   Problem: Activity: Goal: Ability to tolerate increased activity will improve Outcome: Progressing   Problem: Cardiac: Goal: Ability to achieve and maintain adequate cardiopulmonary perfusion will improve Outcome: Progressing   Problem: Clinical Measurements: Goal: Will remain free from infection Outcome: Progressing Goal: Respiratory complications will improve Outcome: Progressing   Problem: Activity: Goal: Risk for activity intolerance will decrease Outcome: Progressing

## 2022-02-07 NOTE — Assessment & Plan Note (Signed)
Stable, no signs of exacerbation Continue  mucinex, symbicort and xoponex prn

## 2022-02-07 NOTE — Assessment & Plan Note (Addendum)
Per cardiology: chosen not to use anticoagulation because of his bleeding and fall risk.  History of GI bleed in 09/2021 and 11/2021  Was on ASA, stopped due to recurrent GI bleeds  Heart rate elevated and given cardizem by EMS. Ranging form 98-128 here in ED ? If he stopped taking his metoprolol. I do not see any documentation that he should stop this by pcp or cards. Noted not taking on MAR and he isn't sure Will start his toprol back now and see how his heart rate does Not AC due to increased fall risk Cardiology was considering watchman, but thought may be too frail, have consulted them  Echo pending

## 2022-02-07 NOTE — Assessment & Plan Note (Signed)
TSH wnl in October 2023 Repeat pending Continue home dose of synthroid, 25mg daily

## 2022-02-07 NOTE — H&P (Signed)
History and Physical    Patient: [name] BHA:[id number] DOB: [birth year] DOA: 02/07/2022 DOS: the patient was seen and examined on 02/07/2022 PCP: Dettinger, [name], MD  Patient coming from: Home - lives alone. He uses a walker and a cane.    Chief Complaint: chest pain   HPI: RANFERI CLINGAN is a 88 y.o. male with medical history significant of CAD, atrial fibrillation not AC, hypothyroidism, HLD, GERD, macrocytic anemia,  thoracic ascending AA, COPD, hx of GI bleedin from GE jx/gastric ulcer and barrett's esophagus and hyperprolactinoma who presented to ED after incident of chest pain this morning that he states was in the center of his chest described as pressure like.  It did not radiate, no associated N/V or shortness or breath, no diaphoresis.  He did have some lightheadedness and dizziness. He then was getting up he tripped and fell onto the couch. He did not hit his head.  It took him awhile to get himself off of the couch.  He got to the recliner and thought he would feel better, but he still had the chest pain. He called EMS and by the time he got to ER he has no more chest pain and continues to be chest pain free. He was told his heart rate was fast. His son also states he has been more confused lately as well.   In August 2023 he had a UGIB. EGD on 8/5 showed severe esophagitis with bleeding and esophageal ulcer with symptoms of recent bleeding that was treated with APCs. Admitted in 11/2021 for acute GI bleed. 10/6, noted to have LA grade C reflux esophagitis without bleeding, benign-appearing esophageal stenosis. 10/7, colonoscopy showed multiple small and large mouth diverticula in sigmoid and descending colon and 2 polyps-snared. Received 2 units PRBC for hgb of 6.9 during this stay.   He does not smoke or drink alcohol.   ER Course:  vitals: temp: 99.5, bp: 109/65, HR: 105, RR: 29, oxygen: 92% RA Pertinent labs: wbc: 15.8, hgb: 10.1, troponin 38>pending CXR: bibasilar  opacities of atelectasis and/or consolidation. Small left pleural effusion.  In ED: given 500cc bolus and TRH asked to admit.    Review of Systems: As mentioned in the history of present illness. All other systems reviewed and are negative. Past Medical History:  Diagnosis Date   Abnormality of gait 06/07/2013   Anemia    Aneurysm ([place])    on assending aorta, currently watching it, Dr Cyndia Bent   Anxiety    Arthritis    Asthma    Atrial fibrillation ([place])    B12 deficiency    Barrett's esophagus    CAD (coronary artery disease) 03/2006   3.0 x 20 mm TAXUS Perseus DES to the LAD; 01/2007  L main 30%, oLAD 50%, pLAD stent ok, CFX 80%, OM 60%, pRCA 60%, mRCA 70%, oPDA 90%; med rx    Cancer ([place])    skin cancer    Cataract    bilateral removal of cateracts   COLONIC POLYPS 07/17/2008   Qualifier: Diagnosis of  By: Lovette Cliche, CNA, Christy     Complication of anesthesia     no issues,but pt prefers spinal due to Pulmonary problems   COPD (chronic obstructive pulmonary disease) ([place])    oxygen  on standby in home.   Depression    Diverticulosis    Emphysema    Enlarged prostate with urinary retention    Esophageal stenosis    Gastric ulcer    GERD (gastroesophageal reflux  disease)    Hiatal hernia    Hypothyroidism    Neuropathy    Osteoporosis    Pituitary macroadenoma (HCC)    Restless legs syndrome (RLS)    Tubular adenoma of colon    UGIB (upper gastrointestinal bleed) 03/2013   EGD w/ large ulcer at GE junction   Past Surgical History:  Procedure Laterality Date   ABDOMINAL HERNIA REPAIR   2008   BIOPSY  06/13/2020   Procedure: BIOPSY;  Surgeon: Harvel Quale, MD;  [facility];  Service: Gastroenterology;;  gastric   BIOPSY  12/04/2021   Procedure: BIOPSY;  Surgeon: Juanita Craver, MD;  [facility];  Service: Gastroenterology;;   CARDIAC CATHETERIZATION  01/2007   L main 30%, oLAD 50%,  pLAD stent ok, CFX 80%, OM 60%, pRCA 60%, mRCA 70%, oPDA  90%; med rx   cataract extraction both eyes     COLONOSCOPY     COLONOSCOPY WITH PROPOFOL N/A 06/13/2020   Procedure: COLONOSCOPY WITH PROPOFOL;  Surgeon: Harvel Quale, MD;  [facility];  Service: Gastroenterology;  Laterality: N/A;   COLONOSCOPY WITH PROPOFOL N/A 12/04/2021   Procedure: COLONOSCOPY WITH PROPOFOL;  Surgeon: Juanita Craver, MD;  [facility];  Service: Gastroenterology;  Laterality: N/A;   CORONARY ANGIOPLASTY     CORONARY STENT PLACEMENT  03/2006   3.0 x 20 mm TAXUS Perseus DES to the LAD   CYSTOSCOPY N/A 06/10/2015   Procedure: CYSTOSCOPY FULGRATION OF BLEEDING,  electovapor resection;  Surgeon: Irine Seal, MD;  [facility];  Service: Urology;  Laterality: N/A;   CYSTOSCOPY WITH INSERTION OF UROLIFT N/A 06/01/2015   Procedure: CYSTOSCOPY WITH INSERTION OF UROLIFT x4;  Surgeon: Irine Seal, MD;  [facility];  Service: Urology;  Laterality: N/A;   ESOPHAGOGASTRODUODENOSCOPY N/A 04/20/2013   Procedure: ESOPHAGOGASTRODUODENOSCOPY (EGD);  Surgeon: Inda Castle, MD;  [facility];  Service: Endoscopy;  Laterality: N/A;   ESOPHAGOGASTRODUODENOSCOPY N/A 03/25/2016   Procedure: ESOPHAGOGASTRODUODENOSCOPY (EGD);  Surgeon: Irene Shipper, MD;  [facility];  Service: Endoscopy;  Laterality: N/A;   ESOPHAGOGASTRODUODENOSCOPY (EGD) WITH PROPOFOL N/A 10/13/2015   Procedure: ESOPHAGOGASTRODUODENOSCOPY (EGD) WITH PROPOFOL;  Surgeon: Jerene Bears, MD;  [facility];  Service: Gastroenterology;  Laterality: N/A;   ESOPHAGOGASTRODUODENOSCOPY (EGD) WITH PROPOFOL N/A 06/13/2020   Procedure: ESOPHAGOGASTRODUODENOSCOPY (EGD) WITH PROPOFOL;  Surgeon: Harvel Quale, MD;  [facility];  Service: Gastroenterology;  Laterality: N/A;   ESOPHAGOGASTRODUODENOSCOPY (EGD) WITH PROPOFOL N/A 10/02/2021   Procedure: ESOPHAGOGASTRODUODENOSCOPY (EGD) WITH PROPOFOL;  Surgeon: Daryel November, MD;  [facility];   Service: Gastroenterology;  Laterality: N/A;   ESOPHAGOGASTRODUODENOSCOPY (EGD) WITH PROPOFOL N/A 12/03/2021   Procedure: ESOPHAGOGASTRODUODENOSCOPY (EGD) WITH PROPOFOL;  Surgeon: Ladene Artist, MD;  [facility];  Service: Gastroenterology;  Laterality: N/A;   FOOT SURGERY  1994 left, 2002 right foot   bilateral foot reconstruciton   FOOT SURGERY     reconstruction of both feet- no retained hardware.   HERNIA REPAIR     HIATAL HERNIA REPAIR  01-04-2008   HOT HEMOSTASIS N/A 10/02/2021   Procedure: HOT HEMOSTASIS (ARGON PLASMA COAGULATION/BICAP);  Surgeon: Daryel November, MD;  [facility];  Service: Gastroenterology;  Laterality: N/A;   JOINT REPLACEMENT     MELANOMA EXCISION  2019   POLYPECTOMY     POLYPECTOMY  06/13/2020   Procedure: POLYPECTOMY;  Surgeon: Harvel Quale, MD;  [facility];  Service: Gastroenterology;;  ascending,  POLYPECTOMY  12/04/2021   Procedure: POLYPECTOMY;  Surgeon: Juanita Craver, MD;  [facility];  Service: Gastroenterology;;   PROSTATE SURGERY     x 2   SAVORY DILATION N/A 03/25/2016   Procedure: SAVORY DILATION;  Surgeon: Irene Shipper, MD;  [facility];  Service: Endoscopy;  Laterality: N/A;   TOTAL HIP ARTHROPLASTY  07/21/2011   Procedure: TOTAL HIP ARTHROPLASTY ANTERIOR APPROACH;  Surgeon: Mauri Pole, MD;  [facility];  Service: Orthopedics;  Laterality: Left;   TOTAL HIP ARTHROPLASTY Right 09/07/2012   Procedure: RIGHT TOTAL HIP ARTHROPLASTY ANTERIOR APPROACH;  Surgeon: Mcarthur Rossetti, MD;  [facility];  Service: Orthopedics;  Laterality: Right;   TRANSURETHRAL RESECTION OF BLADDER NECK N/A 11/04/2015   Procedure: RESECTION OF BLADDER NECK;  Surgeon: Cleon Gustin, MD;  [facility];  Service: Urology;  Laterality: N/A;   TRANSURETHRAL RESECTION OF PROSTATE N/A 11/04/2015   Procedure: TRANSURETHRAL RESECTION OF THE PROSTATE (TURP); REMOVAL OF UROLIFT IMPLANTS X  THREE;  Surgeon: Cleon Gustin, MD;  [facility];  Service: Urology;  Laterality: N/A;   UPPER GASTROINTESTINAL ENDOSCOPY  12/2013   Dr Hilarie Fredrickson, gastritis   VIDEO BRONCHOSCOPY Bilateral 02/01/2017   Procedure: VIDEO BRONCHOSCOPY WITHOUT FLUORO;  Surgeon: Tanda Rockers, MD;  [facility];  Service: Cardiopulmonary;  Laterality: Bilateral;   Social History:  reports that he has never smoked. He has never used smokeless tobacco. He reports that he does not drink alcohol and does not use drugs.  Allergies  Allergen Reactions   Gadavist [Gadobutrol] Hives   Prednisone     Agitation and hallucinations   Betadine [Povidone Iodine] Other (See Comments)    Blisters    Lortab [Hydrocodone-Acetaminophen] Nausea And Vomiting   Penicillins Other (See Comments)    Lightheadedness  Has patient had a PCN reaction causing immediate rash, facial/tongue/throat swelling, SOB or lightheadedness with hypotension: Yes Has patient had a PCN reaction causing severe rash involving mucus membranes or skin necrosis: No Has patient had a PCN reaction that required hospitalization No Has patient had a PCN reaction occurring within the last 10 years: No If all of the above answers are "NO", then may proceed with Cephalosporin use.   Zetia [Ezetimibe] Other (See Comments)    Muscle weakness    Zocor [Simvastatin] Nausea Only and Other (See Comments)    Muscle weakness     Family History  Problem Relation Age of Onset   Heart disease Sister    Heart failure Father    Other Mother        phebitis related to Koden's birth, he was 41 weeks old when she died   Colon cancer Neg Hx    Esophageal cancer Neg Hx    Rectal cancer Neg Hx    Stomach cancer Neg Hx    Colon polyps Neg Hx     Prior to Admission medications   Medication Sig Start Date End Date Taking? Authorizing Provider  Ascorbic Acid (VITAMIN C) 500 MG CAPS Take 1 capsule by mouth daily.   Yes [provider]   budesonide-formoterol (SYMBICORT) 160-4.5 MCG/ACT inhaler Inhale 2 puffs into the lungs in the morning and at bedtime. 03/05/21  Yes Margaretha Seeds, MD  cabergoline (DOSTINEX) 0.5 MG tablet Take 1 tablet (0.5 mg total) by mouth 2 (two) times a week. 01/13/22  Yes Cassandria Anger, MD  cholecalciferol (VITAMIN D) 1000 UNITS tablet Take 1,000 Units by mouth daily.  Yes [provider]  diclofenac Sodium (VOLTAREN) 1 % GEL Apply 2 g topically 4 (four) times daily. 07/14/21  Yes Dettinger, [name], MD  diphenhydrAMINE (BENADRYL) 25 MG tablet Take 25 mg by mouth every 6 (six) hours as needed for itching.   Yes [provider]  diphenhydramine-acetaminophen (TYLENOL PM) 25-500 MG TABS tablet Take 1/2 tablet by mouth at bedtime as needed for sleep   Yes [provider]  FEROSUL 325 (65 Fe) MG tablet TAKE (1) TABLET DAILY WITH BREAKFAST. Patient taking differently: Take 325 mg by mouth daily with breakfast. 02/10/21  Yes Dettinger, [name], MD  pantoprazole (PROTONIX) 40 MG tablet Take 1 tablet (40 mg total) by mouth 2 (two) times daily. 12/06/21 03/06/22 Yes Dahal, Marlowe Aschoff, MD  psyllium (METAMUCIL) 28 % packet Take 1 packet by mouth daily. 06/07/21  Yes Gwenlyn Perking, FNP  sucralfate (CARAFATE) 1 g tablet Take 1 tablet (1 g total) by mouth 4 (four) times daily -  with meals and at bedtime. 12/06/21 03/06/22 Yes Dahal, Marlowe Aschoff, MD  tamsulosin (FLOMAX) 0.4 MG CAPS capsule Take 1 capsule (0.4 mg total) by mouth daily after supper. 07/14/21  Yes Dettinger, [name], MD  traMADol (ULTRAM) 50 MG tablet Take 1 tablet (50 mg total) by mouth 2 (two) times daily as needed. Patient taking differently: Take 50 mg by mouth 2 (two) times daily as needed for moderate pain. 09/01/21  Yes Aundra Dubin, PA-C  triamcinolone cream (KENALOG) 0.1 % Apply 1 application topically 2 (two) times daily. 10/31/18  Yes Rakes, Connye Burkitt, FNP  vitamin B-12 (CYANOCOBALAMIN) 1000 MCG tablet Take 1,000 mcg by  mouth daily.   Yes [provider]  vitamin E 180 MG (400 UNITS) capsule Take 400 Units by mouth daily.   Yes [provider]  calcium carbonate (OS-CAL - DOSED IN MG OF ELEMENTAL CALCIUM) 1250 (500 Ca) MG tablet Take 1 tablet by mouth daily with breakfast. Patient not taking: Reported on 02/07/2022    [provider]  GAVILAX 17 GM/SCOOP powder DISSOLVE 17 GM SCOOP IN 8 OZ. OF WATER AND DRINK ONCE DAILY AS NEEDED FOR CONSTIPATION 06/29/20   Pyrtle, Lajuan Lines, MD  guaiFENesin (MUCINEX) 600 MG 12 hr tablet Take 1 tablet (600 mg total) by mouth 2 (two) times daily. 10/05/21   Bonnielee Haff, MD  levalbuterol [facility] HFA) 45 MCG/ACT inhaler Inhale 1 puff into the lungs every 4 (four) hours as needed for wheezing. 12/06/21 12/06/22  Terrilee Croak, MD  levothyroxine (SYNTHROID) 75 MCG tablet Take 1 tablet (75 mcg total) by mouth daily before breakfast. 12/01/21   Dettinger, [name], MD  magnesium oxide (MAG-OX) 400 (241.3 Mg) MG tablet Take 1 tablet (400 mg total) by mouth 2 (two) times daily. 06/13/20   Cristal Deer, MD  Melatonin 5 MG CAPS Take 1 capsule by mouth at bedtime.    [provider]  metoprolol succinate (TOPROL XL) 25 MG 24 hr tablet Take 1 tablet (25 mg total) by mouth daily. 12/06/21 03/06/22  Dahal, Marlowe Aschoff, MD  naphazoline-pheniramine (NAPHCON-A) 0.025-0.3 % ophthalmic solution Place 1 drop into the left eye 2 (two) times daily as needed for irritation or allergies.    [provider]  nitroGLYCERIN (NITROSTAT) 0.4 MG SL tablet Place 1 tablet (0.4 mg total) under the tongue every 5 (five) minutes as needed for chest pain. 07/14/21   Dettinger, [name], MD  Respiratory Therapy Supplies (FLUTTER) DEVI Twice a day and prn as needed, may increase  if feeling worse 05/01/18   Martyn Ehrich, NP  sodium chloride HYPERTONIC 3 % nebulizer solution Take by nebulization as needed for other. 06/18/20   Margaretha Seeds, MD  Turmeric 500 MG CAPS Take 1 capsule by  mouth daily.    [provider]    Physical Exam: Vitals:   02/07/22 0700 02/07/22 0730 02/07/22 0745 02/07/22 0833  BP: 110/65 102/64 (!) 102/58   Pulse: (!) 111 (!) 112 (!) 108   Resp: (!) 27 (!) 28 (!) 23   Temp:    98.6 F (37 C)  TempSrc:      SpO2: 94% 92% 93%   Weight:      Height:       General:  Appears calm and comfortable and is in NAD Eyes:  PERRL, EOMI, normal lids, iris ENT:  HOH lips & tongue, mmm; appropriate dentition Neck:  no LAD, masses or thyromegaly; no carotid bruits Cardiovascular:  irregularly, irregular, no m/r/g. No LE edema.  Respiratory:   course breath sounds, no crackles or wheezes. Mild dyspnea with exertion  Abdomen:  soft, NT, ND, NABS Back:   normal alignment, no CVAT Skin:  no rash or induration seen on limited exam Musculoskeletal:  grossly normal tone BUE/BLE, good ROM, no bony abnormality Lower extremity:  No LE edema.  Limited foot exam with no ulcerations.  2+ distal pulses. Psychiatric:  grossly normal mood and affect, speech fluent and appropriate, AOx3 Neurologic:  CN 2-12 grossly intact, moves all extremities in coordinated fashion, sensation intact   Radiological Exams on Admission: Independently reviewed - see discussion in A/P where applicable  DG Chest Port 1 View  Result Date: 02/07/2022 CLINICAL DATA:  88 year old male with history of chest pain and cough. EXAM: PORTABLE CHEST 1 VIEW COMPARISON:  Chest x-ray 12/02/2021. FINDINGS: Bibasilar opacities may reflect areas of atelectasis and/or consolidation with superimposed small left pleural effusion. No definite right pleural effusion. No pneumothorax. No definite suspicious appearing pulmonary nodules or masses are noted. Heart size is normal. Upper mediastinal contours are within normal limits. Atherosclerotic calcifications in the thoracic aorta. IMPRESSION: 1. Bibasilar areas of atelectasis and/or consolidation. 2. Small left pleural effusion. 3. Aortic  atherosclerosis. Electronically Signed   By: Vinnie Langton M.D.   On: 02/07/2022 05:01    EKG: Independently reviewed.  Atrial fib with rate 102; nonspecific ST changes with no evidence of acute ischemia   Labs on Admission: I have personally reviewed the available labs and imaging studies at the time of the admission.  Pertinent labs:   wbc: 15.8,  hgb: 10.1,  troponin 38>pending  Assessment and Plan: Principal Problem:   Chest pain Active Problems:   Afib (HCC)   Diastolic CHF (HCC)   Leukocytosis   COPD (chronic obstructive pulmonary disease) (HCC)   Hyperprolactinemia/pituitary adenoma   Hypothyroidism   Hyperlipidemia LDL goal <70   Thoracic ascending aortic aneurysm (HCC)   Gastroesophageal reflux disease with esophagitis   BPH (benign prostatic hyperplasia)   Macrocytic anemia/blood loss anemia   CAD (coronary artery disease)    Assessment and Plan: * Chest pain 88 year old presenting with episode of chest pain that has resolved with mildly elevated troponin in setting of known CAD and afib. He had a negative stress perfusion study in 2017  -obs to tele -history has some characteristics of angina, but also could be from his intermittent uncontrolled afib -mild bump in troponin, delta pending -chest pain free -check echo -give his metoprolol now, may  have stopped this on his own  -cards consulted -not AC due to high risk of bleeds/falls -no longer on ASA due to recurrent GI bleeding  -not on statin allergy, LDL in October was 82   Afib [facility]) Per cardiology: chosen not to use anticoagulation because of his bleeding and fall risk.  History of GI bleed in 09/2021 and 11/2021  Was on ASA, stopped due to recurrent GI bleeds  Heart rate elevated and given cardizem by EMS. Ranging form 98-128 here in ED ? If he stopped taking his metoprolol. I do not see any documentation that he should stop this by pcp or cards. Noted not taking on MAR and he isn't sure Will start  his toprol back now and see how his heart rate does Not AC due to increased fall risk Cardiology was considering watchman, but thought may be too frail, have consulted them  Echo pending   Leukocytosis ? Reactive On no oral steroids No s/s of infectious cause, waiting on UA Continue to trend cbc/fever curve  Diastolic CHF ([place]) Echo 04/8099: normal EF with grade 2 DD Euvolemic Strict I/O   COPD (chronic obstructive pulmonary disease) (HCC) Stable, no signs of exacerbation Continue  mucinex, symbicort and xoponex prn   Hyperprolactinemia/pituitary adenoma His most recent MRI showed stable finding of pituitary lesion which is approximately 1.4 cm x 2 cm x 1.5 cm in January 2023 continue cabergoline 0.5 mg twice a week, was not completely responsive to bromocriptine.  Followed by endocrinology   Hypothyroidism TSH wnl in October 2023 Repeat pending Continue home dose of synthroid, 56mg daily   Hyperlipidemia LDL goal <70 LDL of 82, allergy to statin   Thoracic ascending [place]ed by cardiology, last note in 11/23-This was 43 mm in April of last 2022.  He has not wanted surgery.  I will not plan further imaging.     Gastroesophageal reflux disease with esophagitis Continue protonix BID and carafate   BPH (benign prostatic hyperplasia) Continue flomax   Macrocytic anemia/blood loss anemia At baseline of 10 Trending upward with oral iron and iron infusion s/p GI bleed in August and October of this year  Continue oral iron     Advance Care Planning:   Code Status: DNR   Consults: cardiology   DVT Prophylaxis: lovenox   Family Communication: son at bedside, AJonni Sanger   Severity of Illness: The appropriate patient status for this patient is OBSERVATION. Observation status is judged to be reasonable and necessary in order to provide the required intensity of service to ensure the patient's safety. The patient's presenting symptoms, physical exam  findings, and initial radiographic and laboratory data in the context of their medical condition is felt to place them at decreased risk for further clinical deterioration. Furthermore, it is anticipated that the patient will be medically stable for discharge from the hospital within 2 midnights of admission.   Author: AOrma Flaming MD 02/07/2022 9:52 AM  For on call review [email or web address]

## 2022-02-07 NOTE — Consult Note (Addendum)
Cardiology Consultation   Patient ID: AURTHER HARLIN MRN: [id number]; DOB: 1932/10/31  Admit date: 02/07/2022 Date of Consult: 02/07/2022  PCP:  Dettinger, [name], MD   [place] Providers Cardiologist:  Minus Breeding, MD        Patient Profile:   [name] is a 88 y.o. male with a hx of CAD with PCI 2008,  neg stress tests since, hx of PVCs and atrial tach, NSVT, dilated aorta 47 mm, hx GI bleed, atrial fib ?resolved who is being seen 02/07/2022 for the evaluation of  chest pain at the request of Dr. Rogers Blocker. .  History of Present Illness:   [name] with hx of CAD in 2008 with admit for angina and cath with 95% pLAS, LCX  no significant stenosis, in RCA small PDA had 70-80%,  EF 60%,  underwent PCI of pLAD with Taxus stent.  Recurrent chest pain 2 week later with cath and patent stent in LAD and stable disease in small RCA.  Last cath 12/08 for follow up in research study, and LM 30% ostial stenosis, prox LAD 4-50% stenosis and then patent stent in LAD. pLCX 80% stenosis and 50-60%  in mid portion.   Hx of dilated ascending aortic aneurysm 42 mm 09/2021. -followed by Dr. Cyndia Bent.   Other hx COPD. GI bleed, Barrett's esophagus,   Today pt woke up with mild chest pressure, he believes the pain woke him up.  he got out of bed stumbled and fell.  No injuries in fall.  Now chest pain resolved.  He did report increased palpitation and faster HR over last few day.   Initially in atrial fib but has converted to SR.   Na 135, K+ 3.8 BUN 13, Cr 1.17  Hs troponin 38>> 113 WBC 15.8 Hgb 10.1 stable plts 220   PCXR:  IMPRESSION: 1. Bibasilar areas of atelectasis and/or consolidation. 2. Small left pleural effusion. 3. Aortic atherosclerosis.  EKG:  The EKG was personally reviewed and demonstrates:  Atrial fib HR 102 and no acute ST changes,  review of EKGs in 11/2021 was in atrial fib on one and others SR.    Hx of RBBB.  Telemetry:  Telemetry was personally reviewed  and demonstrates:  now back in SR   BP 131/77 P 63 R 31 T 99.5 + cough at bedside productive with white frothy sputum - he and his son note the cough is worse than usual. No pain until he was waken from sleep  Past Medical History:  Diagnosis Date   Abnormality of gait 06/07/2013   Anemia    Aneurysm ([place])    on assending aorta, currently watching it, Dr Cyndia Bent   Anxiety    Arthritis    Asthma    Atrial fibrillation ([place])    B12 deficiency    Barrett's esophagus    CAD (coronary artery disease) 03/2006   3.0 x 20 mm TAXUS Perseus DES to the LAD; 01/2007  L main 30%, oLAD 50%, pLAD stent ok, CFX 80%, OM 60%, pRCA 60%, mRCA 70%, oPDA 90%; med rx    Cancer ([place])    skin cancer    Cataract    bilateral removal of cateracts   COLONIC POLYPS 07/17/2008   Qualifier: Diagnosis of  By: Lovette Cliche, CNA, Christy     Complication of anesthesia     no issues,but pt prefers spinal due to Pulmonary problems   COPD (chronic obstructive pulmonary disease) ([place])    oxygen  on  standby in home.   Depression    Diverticulosis    Emphysema    Enlarged prostate with urinary retention    Esophageal stenosis    Gastric ulcer    GERD (gastroesophageal reflux disease)    Hiatal hernia    Hypothyroidism    Neuropathy    Osteoporosis    Pituitary macroadenoma (HCC)    Restless legs syndrome (RLS)    Tubular adenoma of colon    UGIB (upper gastrointestinal bleed) 03/2013   EGD w/ large ulcer at GE junction    Past Surgical History:  Procedure Laterality Date   ABDOMINAL HERNIA REPAIR   2008   BIOPSY  06/13/2020   Procedure: BIOPSY;  Surgeon: Harvel Quale, MD;  [facility];  Service: Gastroenterology;;  gastric   BIOPSY  12/04/2021   Procedure: BIOPSY;  Surgeon: Juanita Craver, MD;  [facility];  Service: Gastroenterology;;   CARDIAC CATHETERIZATION  01/2007   L main 30%, oLAD 50%,  pLAD stent ok, CFX 80%, OM 60%, pRCA 60%, mRCA 70%, oPDA 90%; med rx   cataract  extraction both eyes     COLONOSCOPY     COLONOSCOPY WITH PROPOFOL N/A 06/13/2020   Procedure: COLONOSCOPY WITH PROPOFOL;  Surgeon: Harvel Quale, MD;  [facility];  Service: Gastroenterology;  Laterality: N/A;   COLONOSCOPY WITH PROPOFOL N/A 12/04/2021   Procedure: COLONOSCOPY WITH PROPOFOL;  Surgeon: Juanita Craver, MD;  [facility];  Service: Gastroenterology;  Laterality: N/A;   CORONARY ANGIOPLASTY     CORONARY STENT PLACEMENT  03/2006   3.0 x 20 mm TAXUS Perseus DES to the LAD   CYSTOSCOPY N/A 06/10/2015   Procedure: CYSTOSCOPY FULGRATION OF BLEEDING,  electovapor resection;  Surgeon: Irine Seal, MD;  [facility];  Service: Urology;  Laterality: N/A;   CYSTOSCOPY WITH INSERTION OF UROLIFT N/A 06/01/2015   Procedure: CYSTOSCOPY WITH INSERTION OF UROLIFT x4;  Surgeon: Irine Seal, MD;  [facility];  Service: Urology;  Laterality: N/A;   ESOPHAGOGASTRODUODENOSCOPY N/A 04/20/2013   Procedure: ESOPHAGOGASTRODUODENOSCOPY (EGD);  Surgeon: Inda Castle, MD;  [facility];  Service: Endoscopy;  Laterality: N/A;   ESOPHAGOGASTRODUODENOSCOPY N/A 03/25/2016   Procedure: ESOPHAGOGASTRODUODENOSCOPY (EGD);  Surgeon: Irene Shipper, MD;  Location: [facility];  Service: Endoscopy;  Laterality: N/A;   ESOPHAGOGASTRODUODENOSCOPY (EGD) WITH PROPOFOL N/A 10/13/2015   Procedure: ESOPHAGOGASTRODUODENOSCOPY (EGD) WITH PROPOFOL;  Surgeon: Jerene Bears, MD;  [facility];  Service: Gastroenterology;  Laterality: N/A;   ESOPHAGOGASTRODUODENOSCOPY (EGD) WITH PROPOFOL N/A 06/13/2020   Procedure: ESOPHAGOGASTRODUODENOSCOPY (EGD) WITH PROPOFOL;  Surgeon: Harvel Quale, MD;  [facility];  Service: Gastroenterology;  Laterality: N/A;   ESOPHAGOGASTRODUODENOSCOPY (EGD) WITH PROPOFOL N/A 10/02/2021   Procedure: ESOPHAGOGASTRODUODENOSCOPY (EGD) WITH PROPOFOL;  Surgeon: Daryel November, MD;  [facility];  Service: Gastroenterology;   Laterality: N/A;   ESOPHAGOGASTRODUODENOSCOPY (EGD) WITH PROPOFOL N/A 12/03/2021   Procedure: ESOPHAGOGASTRODUODENOSCOPY (EGD) WITH PROPOFOL;  Surgeon: Ladene Artist, MD;  [facility];  Service: Gastroenterology;  Laterality: N/A;   FOOT SURGERY  1994 left, 2002 right foot   bilateral foot reconstruciton   FOOT SURGERY     reconstruction of both feet- no retained hardware.   HERNIA REPAIR     HIATAL HERNIA REPAIR  01-04-2008   HOT HEMOSTASIS N/A 10/02/2021   Procedure: HOT HEMOSTASIS (ARGON PLASMA COAGULATION/BICAP);  Surgeon: Daryel November, MD;  Location[facility];  Service: Gastroenterology;  Laterality: N/A;   JOINT  REPLACEMENT     MELANOMA EXCISION  2019   POLYPECTOMY     POLYPECTOMY  06/13/2020   Procedure: POLYPECTOMY;  Surgeon: Harvel Quale, MD;  [facility];  Service: Gastroenterology;;  ascending,    POLYPECTOMY  12/04/2021   Procedure: POLYPECTOMY;  Surgeon: Juanita Craver, MD;  Location[facility];  Service: Gastroenterology;;   PROSTATE SURGERY     x 2   SAVORY DILATION N/A 03/25/2016   Procedure: SAVORY DILATION;  Surgeon: Irene Shipper, MD;  [facility];  Service: Endoscopy;  Laterality: N/A;   TOTAL HIP ARTHROPLASTY  07/21/2011   Procedure: TOTAL HIP ARTHROPLASTY ANTERIOR APPROACH;  Surgeon: Mauri Pole, MD;  [facility];  Service: Orthopedics;  Laterality: Left;   TOTAL HIP ARTHROPLASTY Right 09/07/2012   Procedure: RIGHT TOTAL HIP ARTHROPLASTY ANTERIOR APPROACH;  Surgeon: Mcarthur Rossetti, MD;  [facility];  Service: Orthopedics;  Laterality: Right;   TRANSURETHRAL RESECTION OF BLADDER NECK N/A 11/04/2015   Procedure: RESECTION OF BLADDER NECK;  Surgeon: Cleon Gustin, MD;  [facility];  Service: Urology;  Laterality: N/A;   TRANSURETHRAL RESECTION OF PROSTATE N/A 11/04/2015   Procedure: TRANSURETHRAL RESECTION OF THE PROSTATE (TURP); REMOVAL OF UROLIFT IMPLANTS X THREE;  Surgeon: Cleon Gustin, MD;  [facility];  Service: Urology;  Laterality: N/A;   UPPER GASTROINTESTINAL ENDOSCOPY  12/2013   Dr Hilarie Fredrickson, gastritis   VIDEO BRONCHOSCOPY Bilateral 02/01/2017   Procedure: VIDEO BRONCHOSCOPY WITHOUT FLUORO;  Surgeon: Tanda Rockers, MD;  [facility];  Service: Cardiopulmonary;  Laterality: Bilateral;     Home Medications:  Prior to Admission medications   Medication Sig Start Date End Date Taking? Authorizing Provider  Ascorbic Acid (VITAMIN C) 500 MG CAPS Take 1 capsule by mouth daily.   Yes [provider]  budesonide-formoterol (SYMBICORT) 160-4.5 MCG/ACT inhaler Inhale 2 puffs into the lungs in the morning and at bedtime. 03/05/21  Yes Margaretha Seeds, MD  cabergoline (DOSTINEX) 0.5 MG tablet Take 1 tablet (0.5 mg total) by mouth 2 (two) times a week. 01/13/22  Yes Cassandria Anger, MD  cholecalciferol (VITAMIN D) 1000 UNITS tablet Take 1,000 Units by mouth daily.    Yes [provider]  diclofenac Sodium (VOLTAREN) 1 % GEL Apply 2 g topically 4 (four) times daily. 07/14/21  Yes Dettinger, [name], MD  diphenhydrAMINE (BENADRYL) 25 MG tablet Take 25 mg by mouth every 6 (six) hours as needed for itching.   Yes [provider]  diphenhydramine-acetaminophen (TYLENOL PM) 25-500 MG TABS tablet Take 1/2 tablet by mouth at bedtime as needed for sleep   Yes [provider]  FEROSUL 325 (65 Fe) MG tablet TAKE (1) TABLET DAILY WITH BREAKFAST. Patient taking differently: Take 325 mg by mouth daily with breakfast. 02/10/21  Yes Dettinger, [name], MD  GAVILAX 17 GM/SCOOP powder DISSOLVE 17 GM SCOOP IN 8 OZ. OF WATER AND DRINK ONCE DAILY AS NEEDED FOR CONSTIPATION Patient taking differently: Take 1 Container by mouth daily as needed for mild constipation or moderate constipation. 06/29/20  Yes Pyrtle, Lajuan Lines, MD  guaiFENesin (MUCINEX) 600 MG 12 hr tablet Take 1 tablet (600 mg total) by mouth 2 (two) times daily. 10/05/21  Yes Bonnielee Haff, MD  levalbuterol [facility] HFA) 45 MCG/ACT inhaler Inhale 1 puff into the lungs every 4 (four) hours as needed for wheezing. 12/06/21 12/06/22 Yes Dahal, Marlowe Aschoff, MD  levothyroxine (SYNTHROID) 75 MCG tablet Take 1 tablet (75  mcg total) by mouth daily before breakfast. 12/01/21  Yes Dettinger, [name], MD  magnesium oxide (MAG-OX) 400 (241.3 Mg) MG tablet Take 1 tablet (400 mg total) by mouth 2 (two) times daily. 06/13/20  Yes Cristal Deer, MD  Melatonin 5 MG CAPS Take 1 capsule by mouth at bedtime.   Yes [provider]  nitroGLYCERIN (NITROSTAT) 0.4 MG SL tablet Place 1 tablet (0.4 mg total) under the tongue every 5 (five) minutes as needed for chest pain. 07/14/21  Yes Dettinger, [name], MD  pantoprazole (PROTONIX) 40 MG tablet Take 1 tablet (40 mg total) by mouth 2 (two) times daily. 12/06/21 03/06/22 Yes Dahal, Marlowe Aschoff, MD  psyllium (METAMUCIL) 28 % packet Take 1 packet by mouth daily. 06/07/21  Yes Gwenlyn Perking, FNP  sodium chloride HYPERTONIC 3 % nebulizer solution Take by nebulization as needed for other. 06/18/20  Yes Margaretha Seeds, MD  sucralfate (CARAFATE) 1 g tablet Take 1 tablet (1 g total) by mouth 4 (four) times daily -  with meals and at bedtime. 12/06/21 03/06/22 Yes Dahal, Marlowe Aschoff, MD  tamsulosin (FLOMAX) 0.4 MG CAPS capsule Take 1 capsule (0.4 mg total) by mouth daily after supper. 07/14/21  Yes Dettinger, [name], MD  traMADol (ULTRAM) 50 MG tablet Take 1 tablet (50 mg total) by mouth 2 (two) times daily as needed. Patient taking differently: Take 50 mg by mouth 2 (two) times daily as needed for moderate pain. 09/01/21  Yes Aundra Dubin, PA-C  triamcinolone cream (KENALOG) 0.1 % Apply 1 application topically 2 (two) times daily. 10/31/18  Yes Rakes, Connye Burkitt, FNP  Turmeric 500 MG CAPS Take 1 capsule by mouth daily.   Yes [provider]  vitamin B-12 (CYANOCOBALAMIN) 1000 MCG tablet Take 1,000 mcg by mouth daily.   Yes [provider]  vitamin E 180  MG (400 UNITS) capsule Take 400 Units by mouth daily.   Yes [provider]  calcium carbonate (OS-CAL - DOSED IN MG OF ELEMENTAL CALCIUM) 1250 (500 Ca) MG tablet Take 1 tablet by mouth daily with breakfast. Patient not taking: Reported on 02/07/2022    [provider]  metoprolol succinate (TOPROL XL) 25 MG 24 hr tablet Take 1 tablet (25 mg total) by mouth daily. Patient not taking: Reported on 02/07/2022 12/06/21 03/06/22  Terrilee Croak, MD  naphazoline-pheniramine (NAPHCON-A) 0.025-0.3 % ophthalmic solution Place 1 drop into the left eye 2 (two) times daily as needed for irritation or allergies. Patient not taking: Reported on 02/07/2022    [provider]  Respiratory Therapy Supplies (FLUTTER) DEVI Twice a day and prn as needed, may increase if feeling worse 05/01/18   Martyn Ehrich, NP    Inpatient Medications: Scheduled Meds:  cabergoline  0.5 mg Oral Once per day on Mon Thu   enoxaparin (LOVENOX) injection  40 mg Subcutaneous Q24H   [START ON 02/08/2022] ferrous sulfate  325 mg Oral Q breakfast   guaiFENesin  600 mg Oral BID   [START ON 02/08/2022] levothyroxine  75 mcg Oral QAC breakfast   magnesium oxide  400 mg Oral BID   Melatonin  1 capsule Oral QHS   metoprolol succinate  25 mg Oral Daily   mometasone-formoterol  2 puff Inhalation BID   pantoprazole  40 mg Oral BID   psyllium  1 packet Oral Daily   sodium chloride flush  3 mL Intravenous Q12H   sucralfate  1 g Oral TID WC & HS   tamsulosin  0.4  mg Oral QPC supper   Continuous Infusions:  sodium chloride     PRN Meds: sodium chloride, diphenhydrAMINE, levalbuterol, nitroGLYCERIN, sodium chloride flush, traMADol  Allergies:    Allergies  Allergen Reactions   Gadavist [Gadobutrol] Hives   Prednisone     Agitation and hallucinations   Betadine [Povidone Iodine] Other (See Comments)    Blisters    Lortab [Hydrocodone-Acetaminophen] Nausea And Vomiting   Penicillins Other (See Comments)     Lightheadedness  Has patient had a PCN reaction causing immediate rash, facial/tongue/throat swelling, SOB or lightheadedness with hypotension: Yes Has patient had a PCN reaction causing severe rash involving mucus membranes or skin necrosis: No Has patient had a PCN reaction that required hospitalization No Has patient had a PCN reaction occurring within the last 10 years: No If all of the above answers are "NO", then may proceed with Cephalosporin use.   Zetia [Ezetimibe] Other (See Comments)    Muscle weakness    Zocor [Simvastatin] Nausea Only and Other (See Comments)    Muscle weakness     Social History:   Social History   Socioeconomic History   Marital status: Widowed    Spouse name: Not on file   Number of children: 2   Years of education: 14   Highest education level: Associate degree: occupational, Hotel manager, or vocational program  Occupational History   Occupation: retired  Tobacco Use   Smoking status: Never   Smokeless tobacco: Never  Vaping Use   Vaping Use: Never used  Substance and Sexual Activity   Alcohol use: No   Drug use: No   Sexual activity: Not Currently    Birth control/protection: None  Other Topics Concern   Not on file  Social History Narrative   Patient drinks caffeine a few times a week.   Patient is right handed.   Social Determinants of Health   Financial Resource Strain: Low Risk  (02/08/2021)   Overall Financial Resource Strain (CARDIA)    Difficulty of Paying Living Expenses: Not hard at all  Food Insecurity: No Food Insecurity (12/02/2021)   Hunger Vital Sign    Worried About Running Out of Food in the Last Year: Never true    Ran Out of Food in the Last Year: Never true  Transportation Needs: No Transportation Needs (12/02/2021)   PRAPARE - Hydrologist (Medical): No    Lack of Transportation (Non-Medical): No  Physical Activity: Insufficiently Active (02/08/2021)   Exercise Vital Sign    Days of  Exercise per Week: 7 days    Minutes of Exercise per Session: 20 min  Stress: No Stress Concern Present (02/08/2021)   [place]    Feeling of Stress : Not at all  Social Connections: Moderately Integrated (02/08/2021)   Social Connection and Isolation Panel [NHANES]    Frequency of Communication with Friends and Family: More than three times a week    Frequency of Social Gatherings with Friends and Family: More than three times a week    Attends Religious Services: More than 4 times per year    Active Member of [employer] or Organizations: Yes    Attends Archivist Meetings: More than 4 times per year    Marital Status: Widowed  Intimate Partner Violence: Not At Risk (12/02/2021)   Humiliation, Afraid, Rape, and Kick questionnaire    Fear of Current or Ex-Partner: No    Emotionally Abused: No  Physically Abused: No    Sexually Abused: No    Family History:    Family History  Problem Relation Age of Onset   Heart disease Sister    Heart failure Father    Other Mother        phebitis related to Brewer's birth, he was 40 weeks old when she died   Colon cancer Neg Hx    Esophageal cancer Neg Hx    Rectal cancer Neg Hx    Stomach cancer Neg Hx    Colon polyps Neg Hx      ROS:  Please see the history of present illness.  General:no colds or fevers, no weight changes Skin:no rashes or ulcers HEENT:no blurred vision, no congestion CV:see HPI PUL:see HPI  hx COPD GI:no diarrhea constipation or melena, no indigestion, hx Barrett's esophagus, gastric ulcer hx.   GU:no hematuria, no dysuria MS:no joint pain, no claudication Neuro:no syncope, no lightheadedness Endo:no diabetes, + thyroid disease  All other ROS reviewed and negative.     Physical Exam/Data:   Vitals:   02/07/22 0745 02/07/22 0833 02/07/22 1010 02/07/22 1030  BP: (!) 102/58  115/76 131/77  Pulse: (!) 108  (!) 107 63  Resp: (!) 23   (!) 31   Temp:  98.6 F (37 C)    TempSrc:      SpO2: 93%   93%  Weight:      Height:        Intake/Output Summary (Last 24 hours) at 02/07/2022 1149 Last data filed at 02/07/2022 7824 Gross per 24 hour  Intake 500 ml  Output --  Net 500 ml      02/07/2022    4:20 AM 01/31/2022   11:00 AM 01/28/2022   10:47 AM  Last 3 Weights  Weight (lbs) 150 lb 155 lb 3.2 oz 155 lb  Weight (kg) 68.04 kg 70.398 kg 70.308 kg     Body mass index is 24.21 kg/m.  General:  Well nourished, well developed, in no acute distress. Does have productive cough HEENT: normal Neck: + JVD Vascular: No carotid bruits; Distal pulses 2+ bilaterally Cardiac:  normal S1, S2; RRR; no murmur gallup rub or click Lungs:  harsh rhonchi throughout to auscultation bilaterally, no wheezing or rales  Abd: soft, nontender, no hepatomegaly  Ext: no edema Musculoskeletal:  No deformities, BUE and BLE strength normal and equal Skin: warm to very warm and dry  Neuro:  alert and oriented X 3 MAE follows commands, no focal abnormalities noted Psych:  Normal affect    Relevant CV Studies: Echo pending  Echo 10/01/21 IMPRESSIONS     1. Left ventricular ejection fraction, by estimation, is 60 to 65%. The  left ventricle has normal function. The left ventricle has no regional  wall motion abnormalities. Left ventricular diastolic parameters are  consistent with Grade II diastolic  dysfunction (pseudonormalization).   2. Right ventricular systolic function is normal. The right ventricular  size is normal. There is normal pulmonary artery systolic pressure. The  estimated right ventricular systolic pressure is 23.5 mmHg.   3. Left atrial size was mildly dilated.   4. Right atrial size was mildly dilated.   5. The mitral valve is normal in structure. Mild mitral valve  regurgitation. No evidence of mitral stenosis.   6. The aortic valve is tricuspid. Aortic valve regurgitation is trivial.  No aortic stenosis is present.   7.  Aortic dilatation noted. There is mild dilatation of the aortic root,  measuring  38 mm.   8. The inferior vena cava is normal in size with greater than 50%  respiratory variability, suggesting right atrial pressure of 3 mmHg.   FINDINGS   Left Ventricle: Left ventricular ejection fraction, by estimation, is 60  to 65%. The left ventricle has normal function. The left ventricle has no  regional wall motion abnormalities. The left ventricular internal cavity  size was normal in size. There is   no left ventricular hypertrophy. Left ventricular diastolic parameters  are consistent with Grade II diastolic dysfunction (pseudonormalization).   Right Ventricle: The right ventricular size is normal. No increase in  right ventricular wall thickness. Right ventricular systolic function is  normal. There is normal pulmonary artery systolic pressure. The tricuspid  regurgitant velocity is 2.61 m/s, and   with an assumed right atrial pressure of 3 mmHg, the estimated right  ventricular systolic pressure is 16.1 mmHg.   Left Atrium: Left atrial size was mildly dilated.   Right Atrium: Right atrial size was mildly dilated.   Pericardium: There is no evidence of pericardial effusion.   Mitral Valve: The mitral valve is normal in structure. Mild mitral valve  regurgitation. No evidence of mitral valve stenosis.   Tricuspid Valve: The tricuspid valve is normal in structure. Tricuspid  valve regurgitation is trivial.   Aortic Valve: The aortic valve is tricuspid. Aortic valve regurgitation is  trivial. No aortic stenosis is present.   Pulmonic Valve: The pulmonic valve was normal in structure. Pulmonic valve  regurgitation is not visualized.   Aorta: Aortic dilatation noted. There is mild dilatation of the aortic  root, measuring 38 mm.   Venous: The inferior vena cava is normal in size with greater than 50%  respiratory variability, suggesting right atrial pressure of 3 mmHg.   IAS/Shunts:  No atrial level shunt detected by color flow Doppler.   Last nuc study 06/14/15 IMPRESSION: 1. No reversible ischemia or infarction.   2. Normal left ventricular wall motion.   3. Left ventricular ejection fraction 80%   4. Low-risk stress test findings*.    last cardiac cath 01/29/2007 LEFT MAIN:  Short and was heavily calcified.  There was a 30% ostial  stenosis.    LEFT ANTERIOR DESCENDING ARTERY:  A long vessel coursing to the apex.  It gave off 2 moderate-sized diagonals in the proximal to mid portion.  In the very proximal LAD there was a 40-50% smooth plaque.  This area  was calcified.  After that there was stent in the proximal portion,  which was widely patent.  There was some mild to moderate ostial disease  in the second diagonal.  Otherwise, the LAD was free of significant  stenosis.    LEFT CIRCUMFLEX.  Appeared to be a large codominant vessel.  It gave off  a small ramus branch, a large OM1 and a large PDA.  There was 80% lesion  in the proximal portion of the circumflex, a 60% lesion in the  midportion of the OM; the PDA was free of critical disease.    RIGHT CORONARY ARTERY:  A small codominant vessel.  It gave off a small  PDA and a very tiny posterolateral.  In the proximal portion of the RCA  there was a 50-60% lesion, in the midportion there was a 60-70% lesion,  in the ostium of the PDA there was a 90% lesion.  This was an  approximately 1.5-2.0 mm vessel which pumped up slightly with  nitroglycerin.    ASSESSMENT:  1. Two-vessel coronary artery disease with widely patent left anterior      descending stent.  2. The right coronary artery was small with a high-grade posterior      descending lesion; which I suspect is too small of a vessel to do      anything about percutaneously.  I do wonder about the lesion in the      midsection of the right coronary artery.    PLAN/DISCUSSION:  I will review his catheterization films with Dr.  Burt Knack and Dr.  Olevia Perches.  I will continue medical management for now.          Laboratory Data:  High Sensitivity Troponin:   Recent Labs  Lab 02/07/22 0454 02/07/22 0721  TROPONINIHS 38* 113*     Chemistry Recent Labs  Lab 02/07/22 0454  NA 135  K 3.8  CL 106  CO2 19*  GLUCOSE 123*  BUN 13  CREATININE 1.17  CALCIUM 8.3*  GFRNONAA >60  ANIONGAP 10    No results for input(s): "PROT", "ALBUMIN", "AST", "ALT", "ALKPHOS", "BILITOT" in the last 168 hours. Lipids No results for input(s): "CHOL", "TRIG", "HDL", "LABVLDL", "LDLCALC", "CHOLHDL" in the last 168 hours.  Hematology Recent Labs  Lab 02/07/22 0454  WBC 15.8*  RBC 3.52*  HGB 10.1*  HCT 31.7*  MCV 90.1  MCH 28.7  MCHC 31.9  RDW 18.0*  PLT 220   Thyroid No results for input(s): "TSH", "FREET4" in the last 168 hours.  BNPNo results for input(s): "BNP", "PROBNP" in the last 168 hours.  DDimer No results for input(s): "DDIMER" in the last 168 hours.   Radiology/Studies:  DG Chest Port 1 View  Result Date: 02/07/2022 CLINICAL DATA:  88 year old male with history of chest pain and cough. EXAM: PORTABLE CHEST 1 VIEW COMPARISON:  Chest x-ray 12/02/2021. FINDINGS: Bibasilar opacities may reflect areas of atelectasis and/or consolidation with superimposed small left pleural effusion. No definite right pleural effusion. No pneumothorax. No definite suspicious appearing pulmonary nodules or masses are noted. Heart size is normal. Upper mediastinal contours are within normal limits. Atherosclerotic calcifications in the thoracic aorta. IMPRESSION: 1. Bibasilar areas of atelectasis and/or consolidation. 2. Small left pleural effusion. 3. Aortic atherosclerosis. Electronically Signed   By: Vinnie Langton M.D.   On: 02/07/2022 05:01     Assessment and Plan:   Chest pain in pt with known CAD with last nuc 2017 neg for ischemia and last cath 01/2007 with patent stent to LAD with non obst disease prox to stent and LM 30s%, LCX with 80% and  small RCA with 60-70%. Planned medical therapy  will recheck troponin since it is rising. No acute EKG changes.  Echo is pending, no longer on ASA due to GI bleeds  pain could be from atrial fib but has not complained of chest pain with a fib in the past.    Believe this is more pulmonary in origin will check respiratory panel.   PAF - from review it is episodic. No anticoagulation due to hx of GI bleed.  Today Hgb 10.1 was similar 12/1, and has been 8.7 10/02/21  not on ASA, from med list.  Check TSH   per last note by Dr. Percival Spanish pt too frail for Watchman.  Leukocytosis, check u/a with hx of urinary retention check for covid, flu HLD not on medication, intolerant to zetia and zocor  last LDL 10/23 82, HDL 44  Ascending aortic aneurysm 4.2 cm on last CTA  Hx diastolic HF  euvolemic  Hyperprolactinemia/pituitary adenoma/ Hypothyroidism/COPD/GERD with esophgitis/BPH    Risk Assessment/Risk Scores:     TIMI Risk Score for Unstable Angina or Non-ST Elevation MI:   The patient's TIMI risk score is 4, which indicates a 20% risk of all cause mortality, new or recurrent myocardial infarction or need for urgent revascularization in the next 14 days.    CHA2DS2-VASc Score = 5   This indicates a 7.2% annual risk of stroke. The patient's score is based upon: CHF History: 1 HTN History: 1 Diabetes History: 0 Stroke History: 0 Vascular Disease History: 1 Age Score: 2 Gender Score: 0     Pt with frequent GI bleeds so no anticoagulation.       For questions or updates, please contact [facility] Please consult [email or web address] for contact info under    Signed, Cecilie Kicks, NP  02/07/2022 11:49 AM    History and all data above reviewed.  Patient examined.  I agree with the findings as above.   The patient is well known to me.  He has PAF and distant CAD.  He has had no recent chest pain.  He doesn't report recent palpitations.  He has had cough and this has been progressive the last 3  to 4 days.  His son said he was particularly symptomatic before an iron infusion recently but he thought he was doing pretty good a week ago or so.  However, the patient had an episode of falling last night and weakness.  He did not have syncope.  He had increasing cough.  He is not having any chest pressure prior to this.  He was noted when EMS arrived to be in atrial fibrillation with provoked spontaneously.  He has a mildly elevated troponin as above.  The patient exam reveals COR:RRR  ,  Lungs: Decreased lung sounds bilaterally with diffuse mild scattered wheezing,  Abd: Positive bowel sounds normal frequency pitch, no bruits, rebound, or guarding, Ext 2+ pulses, no edema..  All available labs, radiology testing, previous records reviewed. Agree with documented assessment and plan.  Elevated troponin: This is secondary to atrial fibrillation.  He has not had any symptoms consistent with unstable angina.  No invasive cardiovascular testing is suggested.  He would want conservative therapy regardless.  Atrial fibrillation: I suspect an acute pulmonary process that might precipitated this.  We have sent off a respiratory panel now positive for RSV.  Plan per the primary team.  No change in therapy for the atrial fibrillation.  He is not an anticoagulation candidate and I have deemed him because of frailty and after discussions with him not to be a watchman candidate.    Jeneen Rinks Clancy Mullarkey  3:01 PM  02/07/2022

## 2022-02-07 NOTE — Assessment & Plan Note (Addendum)
88 year old presenting with episode of chest pain that has resolved with mildly elevated troponin in setting of known CAD and afib. He had a negative stress perfusion study in 2017  -obs to tele -history has some characteristics of angina, but also could be from his intermittent uncontrolled afib -mild bump in troponin, delta pending -chest pain free -check echo -give his metoprolol now, may have stopped this on his own  -cards consulted -not AC due to high risk of bleeds/falls -no longer on ASA due to recurrent GI bleeding  -not on statin allergy, LDL in October was 47

## 2022-02-07 NOTE — Progress Notes (Signed)
  Echocardiogram 2D Echocardiogram has been performed.  [name] 02/07/2022, 11:30 AM

## 2022-02-07 NOTE — Assessment & Plan Note (Addendum)
Echo 09/2021: normal EF with grade 2 DD Euvolemic Strict I/O

## 2022-02-07 NOTE — Assessment & Plan Note (Signed)
Continue flomax  

## 2022-02-07 NOTE — Assessment & Plan Note (Signed)
?   Reactive On no oral steroids No s/s of infectious cause, waiting on UA Continue to trend cbc/fever curve

## 2022-02-07 NOTE — ED Notes (Signed)
ED TO INPATIENT HANDOFF REPORT  ED Nurse Name and Phone #: [phone]  S Name/Age/Gender [name] 88 y.o. male Room/Bed: 299M/426S  Code Status   Code Status: DNR  Home/SNF/Other Home Patient oriented to: self, place, time, and situation Is this baseline? Yes   Triage Complete: Triage complete  Chief Complaint Chest pain [R07.9]  Triage Note Pt BIB [facility] from home c/o chest discomfort during this time pt also had a fall and took about 30 mins to get himself up. Pt denies any pain from the fall but was stating he was still having chest discomfort so he called EMS. Upon there arrival pt was in a-fib RVR. Pt received '18mg'$  of Cardizem with some improvement per EMS. Pt states chest discomfort has now subsided.      Allergies Allergies  Allergen Reactions   Gadavist [Gadobutrol] Hives   Prednisone     Agitation and hallucinations   Betadine [Povidone Iodine] Other (See Comments)    Blisters    Lortab [Hydrocodone-Acetaminophen] Nausea And Vomiting   Penicillins Other (See Comments)    Lightheadedness  Has patient had a PCN reaction causing immediate rash, facial/tongue/throat swelling, SOB or lightheadedness with hypotension: Yes Has patient had a PCN reaction causing severe rash involving mucus membranes or skin necrosis: No Has patient had a PCN reaction that required hospitalization No Has patient had a PCN reaction occurring within the last 10 years: No If all of the above answers are "NO", then may proceed with Cephalosporin use.   Zetia [Ezetimibe] Other (See Comments)    Muscle weakness    Zocor [Simvastatin] Nausea Only and Other (See Comments)    Muscle weakness     Level of Care/Admitting Diagnosis ED Disposition     ED Disposition  Admit   Condition  --   Comment  Hospital Area: [facility] [facility] [[id number]]  Level of Care: Telemetry Cardiac [[id number]]  May place patient in observation at [facility] or [facility] if equivalent level of  care is available:: No  Covid Evaluation: Asymptomatic - no recent exposure (last 10 days) testing not required  Diagnosis: Chest pain [341962]  Admitting Physician: Orma Flaming [[id number]]  Attending Physician: Orma Flaming [[id number]]          B Medical/Surgery History Past Medical History:  Diagnosis Date   Abnormality of gait 06/07/2013   Anemia    Aneurysm ([place])    on assending aorta, currently watching it, Dr Cyndia Bent   Anxiety    Arthritis    Asthma    Atrial fibrillation ([place])    B12 deficiency    Barrett's esophagus    CAD (coronary artery disease) 03/2006   3.0 x 20 mm TAXUS Perseus DES to the LAD; 01/2007  L main 30%, oLAD 50%, pLAD stent ok, CFX 80%, OM 60%, pRCA 60%, mRCA 70%, oPDA 90%; med rx    Cancer ([place])    skin cancer    Cataract    bilateral removal of cateracts   COLONIC POLYPS 07/17/2008   Qualifier: Diagnosis of  By: Lovette Cliche, CNA, Christy     Complication of anesthesia     no issues,but pt prefers spinal due to Pulmonary problems   COPD (chronic obstructive pulmonary disease) ([place])    oxygen  on standby in home.   Depression    Diverticulosis    Emphysema    Enlarged prostate with urinary retention    Esophageal stenosis    Gastric ulcer    GERD (gastroesophageal reflux  disease)    Hiatal hernia    Hypothyroidism    Neuropathy    Osteoporosis    Pituitary macroadenoma (HCC)    Restless legs syndrome (RLS)    Tubular adenoma of colon    UGIB (upper gastrointestinal bleed) 03/2013   EGD w/ large ulcer at GE junction   Past Surgical History:  Procedure Laterality Date   ABDOMINAL HERNIA REPAIR   2008   BIOPSY  06/13/2020   Procedure: BIOPSY;  Surgeon: Harvel Quale, MD;  [facility];  Service: Gastroenterology;;  gastric   BIOPSY  12/04/2021   Procedure: BIOPSY;  Surgeon: Juanita Craver, MD;  [facility];  Service: Gastroenterology;;   CARDIAC CATHETERIZATION  01/2007   L main 30%, oLAD 50%,  pLAD stent ok,  CFX 80%, OM 60%, pRCA 60%, mRCA 70%, oPDA 90%; med rx   cataract extraction both eyes     COLONOSCOPY     COLONOSCOPY WITH PROPOFOL N/A 06/13/2020   Procedure: COLONOSCOPY WITH PROPOFOL;  Surgeon: Harvel Quale, MD;  [facility];  Service: Gastroenterology;  Laterality: N/A;   COLONOSCOPY WITH PROPOFOL N/A 12/04/2021   Procedure: COLONOSCOPY WITH PROPOFOL;  Surgeon: Juanita Craver, MD;  [facility];  Service: Gastroenterology;  Laterality: N/A;   CORONARY ANGIOPLASTY     CORONARY STENT PLACEMENT  03/2006   3.0 x 20 mm TAXUS Perseus DES to the LAD   CYSTOSCOPY N/A 06/10/2015   Procedure: CYSTOSCOPY FULGRATION OF BLEEDING,  electovapor resection;  Surgeon: Irine Seal, MD;  [facility];  Service: Urology;  Laterality: N/A;   CYSTOSCOPY WITH INSERTION OF UROLIFT N/A 06/01/2015   Procedure: CYSTOSCOPY WITH INSERTION OF UROLIFT x4;  Surgeon: Irine Seal, MD;  [facility];  Service: Urology;  Laterality: N/A;   ESOPHAGOGASTRODUODENOSCOPY N/A 04/20/2013   Procedure: ESOPHAGOGASTRODUODENOSCOPY (EGD);  Surgeon: Inda Castle, MD;  [facility];  Service: Endoscopy;  Laterality: N/A;   ESOPHAGOGASTRODUODENOSCOPY N/A 03/25/2016   Procedure: ESOPHAGOGASTRODUODENOSCOPY (EGD);  Surgeon: Irene Shipper, MD;  Location: [facility];  Service: Endoscopy;  Laterality: N/A;   ESOPHAGOGASTRODUODENOSCOPY (EGD) WITH PROPOFOL N/A 10/13/2015   Procedure: ESOPHAGOGASTRODUODENOSCOPY (EGD) WITH PROPOFOL;  Surgeon: Jerene Bears, MD;  [facility];  Service: Gastroenterology;  Laterality: N/A;   ESOPHAGOGASTRODUODENOSCOPY (EGD) WITH PROPOFOL N/A 06/13/2020   Procedure: ESOPHAGOGASTRODUODENOSCOPY (EGD) WITH PROPOFOL;  Surgeon: Harvel Quale, MD;  [facility];  Service: Gastroenterology;  Laterality: N/A;   ESOPHAGOGASTRODUODENOSCOPY (EGD) WITH PROPOFOL N/A 10/02/2021   Procedure: ESOPHAGOGASTRODUODENOSCOPY (EGD) WITH PROPOFOL;  Surgeon: Daryel November, MD;  [facility];  Service: Gastroenterology;  Laterality: N/A;   ESOPHAGOGASTRODUODENOSCOPY (EGD) WITH PROPOFOL N/A 12/03/2021   Procedure: ESOPHAGOGASTRODUODENOSCOPY (EGD) WITH PROPOFOL;  Surgeon: Ladene Artist, MD;  [facility];  Service: Gastroenterology;  Laterality: N/A;   FOOT SURGERY  1994 left, 2002 right foot   bilateral foot reconstruciton   FOOT SURGERY     reconstruction of both feet- no retained hardware.   HERNIA REPAIR     HIATAL HERNIA REPAIR  01-04-2008   HOT HEMOSTASIS N/A 10/02/2021   Procedure: HOT HEMOSTASIS (ARGON PLASMA COAGULATION/BICAP);  Surgeon: Daryel November, MD;  Location[facility];  Service: Gastroenterology;  Laterality: N/A;   JOINT REPLACEMENT     MELANOMA EXCISION  2019   POLYPECTOMY     POLYPECTOMY  06/13/2020   Procedure: POLYPECTOMY;  Surgeon: Harvel Quale, MD;  [facility];  Service: Gastroenterology;;  ascending,  POLYPECTOMY  12/04/2021   Procedure: POLYPECTOMY;  Surgeon: Juanita Craver, MD;  Location[facility];  Service: Gastroenterology;;   PROSTATE SURGERY     x 2   SAVORY DILATION N/A 03/25/2016   Procedure: SAVORY DILATION;  Surgeon: Irene Shipper, MD;  [facility];  Service: Endoscopy;  Laterality: N/A;   TOTAL HIP ARTHROPLASTY  07/21/2011   Procedure: TOTAL HIP ARTHROPLASTY ANTERIOR APPROACH;  Surgeon: Mauri Pole, MD;  [facility];  Service: Orthopedics;  Laterality: Left;   TOTAL HIP ARTHROPLASTY Right 09/07/2012   Procedure: RIGHT TOTAL HIP ARTHROPLASTY ANTERIOR APPROACH;  Surgeon: Mcarthur Rossetti, MD;  [facility];  Service: Orthopedics;  Laterality: Right;   TRANSURETHRAL RESECTION OF BLADDER NECK N/A 11/04/2015   Procedure: RESECTION OF BLADDER NECK;  Surgeon: Cleon Gustin, MD;  [facility];  Service: Urology;  Laterality: N/A;   TRANSURETHRAL RESECTION OF PROSTATE N/A 11/04/2015   Procedure: TRANSURETHRAL RESECTION OF THE PROSTATE  (TURP); REMOVAL OF UROLIFT IMPLANTS X THREE;  Surgeon: Cleon Gustin, MD;  [facility];  Service: Urology;  Laterality: N/A;   UPPER GASTROINTESTINAL ENDOSCOPY  12/2013   Dr Hilarie Fredrickson, gastritis   VIDEO BRONCHOSCOPY Bilateral 02/01/2017   Procedure: VIDEO BRONCHOSCOPY WITHOUT FLUORO;  Surgeon: Tanda Rockers, MD;  [facility];  Service: Cardiopulmonary;  Laterality: Bilateral;     A IV Location/Drains/Wounds Patient Lines/Drains/Airways Status     Active Line/Drains/Airways     Name Placement date Placement time Site Days   Peripheral IV 02/07/22 18 G Anterior;Left Forearm 02/07/22  0417  Forearm  less than 1            Intake/Output Last 24 hours  Intake/Output Summary (Last 24 hours) at 02/07/2022 1424 Last data filed at 02/07/2022 1829 Gross per 24 hour  Intake 500 ml  Output --  Net 500 ml    Labs/Imaging Results for orders placed or performed during the hospital encounter of 02/07/22 (from the past 48 hour(s))  CBC     Status: Abnormal   Collection Time: 02/07/22  4:54 AM  Result Value Ref Range   WBC 15.8 (H) 4.0 - 10.5 K/uL   RBC 3.52 (L) 4.22 - 5.81 MIL/uL   Hemoglobin 10.1 (L) 13.0 - 17.0 g/dL   HCT 31.7 (L) 39.0 - 52.0 %   MCV 90.1 80.0 - 100.0 fL   MCH 28.7 26.0 - 34.0 pg   MCHC 31.9 30.0 - 36.0 g/dL   RDW 18.0 (H) 11.5 - 15.5 %   Platelets 220 150 - 400 K/uL   nRBC 0.0 0.0 - 0.2 %    Comment: Performed at [facility], [address] [place], [place] [address]  Basic metabolic panel     Status: Abnormal   Collection Time: 02/07/22  4:54 AM  Result Value Ref Range   Sodium 135 135 - 145 mmol/L   Potassium 3.8 3.5 - 5.1 mmol/L   Chloride 106 98 - 111 mmol/L   CO2 19 (L) 22 - 32 mmol/L   Glucose, Bld 123 (H) 70 - 99 mg/dL    Comment: Glucose reference range applies only to samples taken after fasting for at least 8 hours.   BUN 13 8 - 23 mg/dL   Creatinine, Ser 1.17 0.61 - 1.24 mg/dL   Calcium 8.3 (L) 8.9 - 10.3 mg/dL    GFR, Estimated >60 >60 mL/min    Comment: (NOTE) Calculated using the CKD-EPI Creatinine Equation (2021)  Anion gap 10 5 - 15    Comment: Performed at [facility] [address] [place], [place] [address]  Troponin I (High Sensitivity)     Status: Abnormal   Collection Time: 02/07/22  4:54 AM  Result Value Ref Range   Troponin I (High Sensitivity) 38 (H) <18 ng/L    Comment: (NOTE) Elevated high sensitivity troponin I (hsTnI) values and significant  changes across serial measurements may suggest ACS but many other  chronic and acute conditions are known to elevate hsTnI results.  Refer to the "Links" section for chest pain algorithms and additional  guidance. Performed at [facility], [place] [address] [place], [place] [address]   Troponin I (High Sensitivity)     Status: Abnormal   Collection Time: 02/07/22  7:21 AM  Result Value Ref Range   Troponin I (High Sensitivity) 113 (HH) <18 ng/L    Comment: CRITICAL RESULT CALLED TO, READ BACK BY AND VERIFIED WITH SAVANNAH THORTON RN.'@1020'$  ON 12.11.23 BY TCALDWELL MT. (NOTE) Elevated high sensitivity troponin I (hsTnI) values and significant  changes across serial measurements may suggest ACS but many other  chronic and acute conditions are known to elevate hsTnI results.  Refer to the "Links" section for chest pain algorithms and additional  guidance. Performed at [facility], [place] [address], [place], [place] [address]   TSH     Status: None   Collection Time: 02/07/22  1:13 PM  Result Value Ref Range   TSH 1.676 0.350 - 4.500 uIU/mL    Comment: Performed by a 3rd Generation assay with a functional sensitivity of <=0.01 uIU/mL. Performed at [facility], [place] [address] [place], [place] [address]    *Note: Due to a large number of results and/or encounters for the requested time period, some results have not been displayed. A complete set of results can be found in Results Review.   ECHOCARDIOGRAM  COMPLETE  Result Date: 02/07/2022    ECHOCARDIOGRAM REPORT   Patient Name:   [name] Date of Exam: 02/07/2022 Medical Rec #:  [id number]       Height:       66.0 in Accession #:    [id number]      Weight:       150.0 lb Date of Birth:  06/09/1932       BSA:          1.770 m Patient Age:    8 years        BP:           102/58 mmHg Patient Gender: M               HR:           126 bpm. Exam Location:  Inpatient Procedure: 2D Echo, Cardiac Doppler and Color Doppler Indications:    R07.9* Chest pain, unspecified  History:        Patient has prior history of Echocardiogram examinations, most                 recent 10/01/2021. CHF, CAD, Abnormal ECG, COPD, Arrythmias:Atrial                 Fibrillation, Signs/Symptoms:Chest Pain; Risk                 Factors:Dyslipidemia. Ascending aortic aneurysm.  Sonographer:    Roseanna Rainbow RDCS Referring Phys: Orma Flaming  Sonographer Comments: Patient coughing throughout exam. IMPRESSIONS  1. Left ventricular ejection fraction, by estimation, is 60 to 65%. The left ventricle has normal function. The left ventricle has no regional wall motion abnormalities. Left ventricular diastolic function could not be evaluated.  2. Right ventricular systolic function is normal. The right ventricular size is normal. There is normal pulmonary artery systolic pressure.  3. Left atrial size was mildly dilated.  4. Right atrial size was mildly dilated.  5. The mitral valve is normal in structure. Mild mitral valve regurgitation. No evidence of mitral stenosis.  6. The aortic valve is tricuspid. There is mild calcification of the aortic valve. Aortic valve regurgitation is trivial. Aortic valve sclerosis is present, with no evidence of aortic valve stenosis.  7. Aortic dilatation noted. There is moderate dilatation of the ascending aorta, measuring 45 mm.  8. The inferior vena cava is dilated in size with >50% respiratory variability, suggesting right atrial pressure of 8 mmHg. Comparison(s):  Prior images reviewed side by side. Conclusion(s)/Recommendation(s): Moderate dilation of ascending aorta measuring 45 mm. FINDINGS  Left Ventricle: Left ventricular ejection fraction, by estimation, is 60 to 65%. The left ventricle has normal function. The left ventricle has no regional wall motion abnormalities. The left ventricular internal cavity size was normal in size. There is  no left ventricular hypertrophy. Left ventricular diastolic function could not be evaluated due to atrial fibrillation. Left ventricular diastolic function could not be evaluated. Right Ventricle: The right ventricular size is normal. Right vetricular wall thickness was not well visualized. Right ventricular systolic function is normal. There is normal pulmonary artery systolic pressure. The tricuspid regurgitant velocity is 2.57 m/s, and with an assumed right atrial pressure of 8 mmHg, the estimated right ventricular systolic pressure is 37.1 mmHg. Left Atrium: Left atrial size was mildly dilated. Right Atrium: Right atrial size was mildly dilated. Pericardium: There is no evidence of pericardial effusion. Mitral Valve: The mitral valve is normal in structure. Mild mitral valve regurgitation. No evidence of mitral valve stenosis. Tricuspid Valve: The tricuspid valve is normal in structure. Tricuspid valve regurgitation is mild . No evidence of tricuspid stenosis. Aortic Valve: The aortic valve is tricuspid. There is mild calcification of the aortic valve. Aortic valve regurgitation is trivial. Aortic valve sclerosis is present, with no evidence of aortic valve stenosis. Pulmonic Valve: The pulmonic valve was not well visualized. Pulmonic valve regurgitation is not visualized. No evidence of pulmonic stenosis. Aorta: Aortic dilatation noted. There is moderate dilatation of the ascending aorta, measuring 45 mm. Venous: The inferior vena cava is dilated in size with greater than 50% respiratory variability, suggesting right atrial  pressure of 8 mmHg. IAS/Shunts: The atrial septum is grossly normal.  LEFT VENTRICLE PLAX 2D LVIDd:         3.75 cm LVIDs:         2.30 cm LV PW:         0.95 cm LV IVS:        0.90 cm LVOT diam:     2.50 cm LV SV:         70 LV SV Index:   39 LVOT Area:     4.91 cm  LV Volumes (MOD) LV vol d, MOD A2C: 78.0 ml LV vol d, MOD A4C: 64.8 ml LV vol s, MOD A2C: 31.4 ml LV vol s, MOD A4C: 24.0 ml LV SV MOD A2C:     46.6 ml LV SV MOD A4C:     64.8 ml LV SV MOD BP:  44.2 ml RIGHT VENTRICLE            IVC RV S prime:     9.46 cm/s  IVC diam: 2.20 cm TAPSE (M-mode): 2.2 cm LEFT ATRIUM             Index        RIGHT ATRIUM           Index LA diam:        3.50 cm 1.98 cm/m   RA Area:     18.10 cm LA Vol (A2C):   57.1 ml 32.27 ml/m  RA Volume:   50.60 ml  28.59 ml/m LA Vol (A4C):   57.0 ml 32.21 ml/m LA Biplane Vol: 56.9 ml 32.15 ml/m  AORTIC VALVE LVOT Vmax:   93.60 cm/s LVOT Vmean:  65.400 cm/s LVOT VTI:    0.142 m  AORTA Ao Root diam: 3.70 cm Ao Asc diam:  4.16 cm MITRAL VALVE                  TRICUSPID VALVE MV Area (PHT): 5.44 cm       TR Peak grad:   26.4 mmHg MV Decel Time: 139 msec       TR Vmax:        257.00 cm/s MR Peak grad:    73.6 mmHg MR Mean grad:    52.0 mmHg    SHUNTS MR Vmax:         429.00 cm/s  Systemic VTI:  0.14 m MR Vmean:        348.0 cm/s   Systemic Diam: 2.50 cm MR PISA:         0.57 cm MR PISA Eff ROA: 5 mm MR PISA Radius:  0.30 cm MV E velocity: 96.07 cm/s Buford Dresser MD Electronically signed by Buford Dresser MD Signature Date/Time: 02/07/2022/2:03:02 PM    Final    DG Chest Port 1 View  Result Date: 02/07/2022 CLINICAL DATA:  88 year old male with history of chest pain and cough. EXAM: PORTABLE CHEST 1 VIEW COMPARISON:  Chest x-ray 12/02/2021. FINDINGS: Bibasilar opacities may reflect areas of atelectasis and/or consolidation with superimposed small left pleural effusion. No definite right pleural effusion. No pneumothorax. No definite suspicious appearing  pulmonary nodules or masses are noted. Heart size is normal. Upper mediastinal contours are within normal limits. Atherosclerotic calcifications in the thoracic aorta. IMPRESSION: 1. Bibasilar areas of atelectasis and/or consolidation. 2. Small left pleural effusion. 3. Aortic atherosclerosis. Electronically Signed   By: Vinnie Langton M.D.   On: 02/07/2022 05:01    Pending Labs Unresulted Labs (From admission, onward)     Start     Ordered   02/08/22 9735  Basic metabolic panel  Tomorrow morning,   R        02/07/22 0932   02/08/22 0500  CBC  Tomorrow morning,   R        02/07/22 0932   02/08/22 0500  Protime-INR  Tomorrow morning,   R        02/07/22 0932   02/07/22 1228  Resp panel by RT-PCR (RSV, Flu A&B, Covid) Anterior Nasal Swab  Once,   R        02/07/22 1228   02/07/22 0736  Urinalysis, Routine w reflex microscopic  Once,   R        02/07/22 0735   02/07/22 0735  Magnesium  Once,   R        02/07/22 0734  Vitals/Pain Today's Vitals   02/07/22 1010 02/07/22 1028 02/07/22 1030 02/07/22 1315  BP: 115/76  131/77 115/66  Pulse: (!) 107  63 72  Resp:   (!) 31 (!) 31  Temp:    98.7 F (37.1 C)  TempSrc:    Oral  SpO2:   93% 92%  Weight:      Height:      PainSc:  0-No pain      Isolation Precautions No active isolations  Medications Medications  metoprolol succinate (TOPROL-XL) 24 hr tablet 25 mg (25 mg Oral Given 02/07/22 1010)  enoxaparin (LOVENOX) injection 40 mg (40 mg Subcutaneous Given 02/07/22 1010)  sodium chloride flush (NS) 0.9 % injection 3 mL (3 mLs Intravenous Given 02/07/22 1022)  sodium chloride flush (NS) 0.9 % injection 3 mL (has no administration in time range)  0.9 %  sodium chloride infusion (has no administration in time range)  traMADol (ULTRAM) tablet 50 mg (has no administration in time range)  nitroGLYCERIN (NITROSTAT) SL tablet 0.4 mg (has no administration in time range)  diphenhydrAMINE (BENADRYL) capsule 25 mg (has no  administration in time range)  levothyroxine (SYNTHROID) tablet 75 mcg (has no administration in time range)  pantoprazole (PROTONIX) EC tablet 40 mg (40 mg Oral Given 02/07/22 1009)  psyllium (HYDROCIL/METAMUCIL) 1 packet (1 packet Oral Given 02/07/22 1013)  sucralfate (CARAFATE) tablet 1 g (1 g Oral Given 02/07/22 1304)  tamsulosin (FLOMAX) capsule 0.4 mg (has no administration in time range)  ferrous sulfate tablet 325 mg (has no administration in time range)  melatonin tablet 5 mg (has no administration in time range)  magnesium oxide (MAG-OX) tablet 400 mg (400 mg Oral Given 02/07/22 1010)  mometasone-formoterol (DULERA) 200-5 MCG/ACT inhaler 2 puff (2 puffs Inhalation Not Given 02/07/22 1136)  guaiFENesin (MUCINEX) 12 hr tablet 600 mg (600 mg Oral Given 02/07/22 1009)  levalbuterol (XOPENEX) nebulizer solution 0.63 mg (has no administration in time range)  guaiFENesin-dextromethorphan (ROBITUSSIN DM) 100-10 MG/5ML syrup 5 mL (has no administration in time range)  sodium chloride 0.9 % bolus 500 mL (0 mLs Intravenous Stopped 02/07/22 0635)    Mobility walks Moderate fall risk   Focused Assessments Cardiac Assessment Handoff:  Cardiac Rhythm: Atrial fibrillation Lab Results  Component Value Date   CKTOTAL 85 09/30/2021   CKMB 2.1 10/25/2009   TROPONINI 0.57 ([place]) 06/12/2015   Lab Results  Component Value Date   DDIMER 1.12 (H) 06/09/2020   Does the Patient currently have chest pain? No    R Recommendations: See Admitting Provider Note  Report given to:   Additional Notes:

## 2022-02-07 NOTE — ED Triage Notes (Signed)
Pt BIB [facility] from home c/o chest discomfort during this time pt also had a fall and took about 30 mins to get himself up. Pt denies any pain from the fall but was stating he was still having chest discomfort so he called EMS. Upon there arrival pt was in a-fib RVR. Pt received '18mg'$  of Cardizem with some improvement per EMS. Pt states chest discomfort has now subsided.

## 2022-02-07 NOTE — Assessment & Plan Note (Signed)
His most recent MRI showed stable finding of pituitary lesion which is approximately 1.4 cm x 2 cm x 1.5 cm in January 2023 continue cabergoline 0.5 mg twice a week, was not completely responsive to bromocriptine.  Followed by endocrinology

## 2022-02-07 NOTE — Assessment & Plan Note (Signed)
LDL of 82, allergy to statin

## 2022-02-08 DIAGNOSIS — E221 Hyperprolactinemia: Secondary | ICD-10-CM

## 2022-02-08 DIAGNOSIS — I7121 Aneurysm of the ascending aorta, without rupture: Secondary | ICD-10-CM

## 2022-02-08 DIAGNOSIS — I4891 Unspecified atrial fibrillation: Secondary | ICD-10-CM | POA: Diagnosis not present

## 2022-02-08 DIAGNOSIS — J449 Chronic obstructive pulmonary disease, unspecified: Secondary | ICD-10-CM

## 2022-02-08 DIAGNOSIS — R0789 Other chest pain: Secondary | ICD-10-CM | POA: Diagnosis not present

## 2022-02-08 DIAGNOSIS — I2510 Atherosclerotic heart disease of native coronary artery without angina pectoris: Secondary | ICD-10-CM | POA: Diagnosis not present

## 2022-02-08 DIAGNOSIS — I77819 Aortic ectasia, unspecified site: Secondary | ICD-10-CM | POA: Diagnosis not present

## 2022-02-08 DIAGNOSIS — D539 Nutritional anemia, unspecified: Secondary | ICD-10-CM | POA: Diagnosis not present

## 2022-02-08 LAB — CBC
HCT: 30.1 % — ABNORMAL LOW (ref 39.0–52.0)
Hemoglobin: 9.6 g/dL — ABNORMAL LOW (ref 13.0–17.0)
MCH: 28.6 pg (ref 26.0–34.0)
MCHC: 31.9 g/dL (ref 30.0–36.0)
MCV: 89.6 fL (ref 80.0–100.0)
Platelets: 188 10*3/uL (ref 150–400)
RBC: 3.36 MIL/uL — ABNORMAL LOW (ref 4.22–5.81)
RDW: 17.7 % — ABNORMAL HIGH (ref 11.5–15.5)
WBC: 10.4 10*3/uL (ref 4.0–10.5)
nRBC: 0 % (ref 0.0–0.2)

## 2022-02-08 LAB — BASIC METABOLIC PANEL
Anion gap: 8 (ref 5–15)
BUN: 16 mg/dL (ref 8–23)
CO2: 18 mmol/L — ABNORMAL LOW (ref 22–32)
Calcium: 7.9 mg/dL — ABNORMAL LOW (ref 8.9–10.3)
Chloride: 106 mmol/L (ref 98–111)
Creatinine, Ser: 1.18 mg/dL (ref 0.61–1.24)
GFR, Estimated: 60 mL/min — ABNORMAL LOW (ref >60)
Glucose, Bld: 98 mg/dL (ref 70–99)
Potassium: 3.9 mmol/L (ref 3.5–5.1)
Sodium: 132 mmol/L — ABNORMAL LOW (ref 135–145)

## 2022-02-08 LAB — PROTIME-INR
INR: 1.3 — ABNORMAL HIGH (ref 0.8–1.2)
Prothrombin Time: 15.6 seconds — ABNORMAL HIGH (ref 11.4–15.2)

## 2022-02-08 MED ORDER — BENZONATATE 100 MG PO CAPS: 100.0000 mg | ORAL_CAPSULE | Freq: Three times a day (TID) | ORAL | 0 refills | Status: AC

## 2022-02-08 MED ORDER — DEXTROMETHORPHAN HBR 15 MG/5ML PO SYRP: 10.0000 mL | ORAL_SOLUTION | Freq: Four times a day (QID) | ORAL | 0 refills | Status: AC | PRN

## 2022-02-08 MED ORDER — ALBUTEROL SULFATE (2.5 MG/3ML) 0.083% IN NEBU: 2.5000 mg | INHALATION_SOLUTION | RESPIRATORY_TRACT | 0 refills | Status: DC | PRN

## 2022-02-08 MED ORDER — METOPROLOL SUCCINATE ER 25 MG PO TB24: 25.0000 mg | ORAL_TABLET | Freq: Every day | ORAL | 0 refills | Status: DC

## 2022-02-08 NOTE — TOC Transition Note (Signed)
Transition of Care [facility]) - CM/SW Discharge Note   Patient Details  Name: [name] MRN: [id number] Date of Birth: [birth year]  [facility] CM/SW Contact:  Zenon Mayo, RN Phone Number: 02/08/2022, 10:04 AM   Clinical Narrative:     Patient is for dc today, he states his SIL who is a paramedic will be transporting him home today at dc.         Patient Goals and CMS Choice        Discharge Placement                       Discharge Plan and Services                                     Social Determinants of Health (SDOH) Interventions     Readmission Risk Interventions    10/05/2021   10:34 AM  Readmission Risk Prevention Plan  Post Dischage Appt Complete  Medication Screening Complete  Transportation Screening Complete

## 2022-02-08 NOTE — Progress Notes (Signed)
Progress Note  Patient Name: [name] Date of Encounter: 02/08/2022  Primary Cardiologist:   Minus Breeding, MD   Subjective   Breathing better but not at baseline.  No pain.   Inpatient Medications    Scheduled Meds:  enoxaparin (LOVENOX) injection  40 mg Subcutaneous Q24H   ferrous sulfate  325 mg Oral Q breakfast   guaiFENesin  600 mg Oral BID   levothyroxine  75 mcg Oral QAC breakfast   magnesium oxide  400 mg Oral BID   melatonin  5 mg Oral QHS   metoprolol succinate  25 mg Oral Daily   mometasone-formoterol  2 puff Inhalation BID   pantoprazole  40 mg Oral BID   psyllium  1 packet Oral Daily   sodium chloride flush  3 mL Intravenous Q12H   sucralfate  1 g Oral TID WC & HS   tamsulosin  0.4 mg Oral QPC supper   Continuous Infusions:  sodium chloride     PRN Meds: sodium chloride, diphenhydrAMINE, guaiFENesin-dextromethorphan, levalbuterol, nitroGLYCERIN, sodium chloride flush, traMADol   Vital Signs    Vitals:   02/07/22 1315 02/07/22 1553 02/07/22 1920 02/08/22 0126  BP: 115/66 116/68 (!) 108/58 (!) 112/51  Pulse: 72 80 74 77  Resp: (!) '31 19 19 19  '$ Temp: 98.7 F (37.1 C) 98.6 F (37 C) 97.9 F (36.6 C) 99.6 F (37.6 C)  TempSrc: Oral Oral Oral Oral  SpO2: 92% 93% 91% 90%  Weight:  67.8 kg  69.9 kg  Height:  '5\' 6"'$  (1.676 m)     No intake or output data in the 24 hours ending 02/08/22 0850 Filed Weights   02/07/22 0420 02/07/22 1553 02/08/22 0126  Weight: 68 kg 67.8 kg 69.9 kg    Telemetry    NSR, PACs - Personally Reviewed  ECG    NA - Personally Reviewed  Physical Exam   GEN: No acute distress.   Neck: No  JVD Cardiac: RRR, no murmurs, rubs, or gallops.  Respiratory:     Decreased breath sounds with coarse crackles.  GI: Soft, nontender, non-distended  MS: No  edema; No deformity. Neuro:  Nonfocal  Psych: Normal affect   Labs    Chemistry Recent Labs  Lab 02/07/22 0454 02/08/22 0037  NA 135 132*  K 3.8 3.9  CL  106 106  CO2 19* 18*  GLUCOSE 123* 98  BUN 13 16  CREATININE 1.17 1.18  CALCIUM 8.3* 7.9*  GFRNONAA >60 60*  ANIONGAP 10 8     Hematology Recent Labs  Lab 02/07/22 0454 02/08/22 0037  WBC 15.8* 10.4  RBC 3.52* 3.36*  HGB 10.1* 9.6*  HCT 31.7* 30.1*  MCV 90.1 89.6  MCH 28.7 28.6  MCHC 31.9 31.9  RDW 18.0* 17.7*  PLT 220 188    Cardiac EnzymesNo results for input(s): "TROPONINI" in the last 168 hours. No results for input(s): "TROPIPOC" in the last 168 hours.   BNPNo results for input(s): "BNP", "PROBNP" in the last 168 hours.   DDimer No results for input(s): "DDIMER" in the last 168 hours.   Radiology    ECHOCARDIOGRAM COMPLETE  Result Date: 02/07/2022    ECHOCARDIOGRAM REPORT   Patient Name:   [name] Date of Exam: 02/07/2022 Medical Rec #:  [id number]       Height:       66.0 in Accession #:    [id number]      Weight:       150.0  lb Date of Birth:  07-29-1932       BSA:          1.770 m Patient Age:    88 years        BP:           102/58 mmHg Patient Gender: M               HR:           126 bpm. Exam Location:  Inpatient Procedure: 2D Echo, Cardiac Doppler and Color Doppler Indications:    R07.9* Chest pain, unspecified  History:        Patient has prior history of Echocardiogram examinations, most                 recent 10/01/2021. CHF, CAD, Abnormal ECG, COPD, Arrythmias:Atrial                 Fibrillation, Signs/Symptoms:Chest Pain; Risk                 Factors:Dyslipidemia. Ascending aortic aneurysm.  Sonographer:    Roseanna Rainbow RDCS Referring Phys: Orma Flaming  Sonographer Comments: Patient coughing throughout exam. IMPRESSIONS  1. Left ventricular ejection fraction, by estimation, is 60 to 65%. The left ventricle has normal function. The left ventricle has no regional wall motion abnormalities. Left ventricular diastolic function could not be evaluated.  2. Right ventricular systolic function is normal. The right ventricular size is normal. There is normal  pulmonary artery systolic pressure.  3. Left atrial size was mildly dilated.  4. Right atrial size was mildly dilated.  5. The mitral valve is normal in structure. Mild mitral valve regurgitation. No evidence of mitral stenosis.  6. The aortic valve is tricuspid. There is mild calcification of the aortic valve. Aortic valve regurgitation is trivial. Aortic valve sclerosis is present, with no evidence of aortic valve stenosis.  7. Aortic dilatation noted. There is moderate dilatation of the ascending aorta, measuring 45 mm.  8. The inferior vena cava is dilated in size with >50% respiratory variability, suggesting right atrial pressure of 8 mmHg. Comparison(s): Prior images reviewed side by side. Conclusion(s)/Recommendation(s): Moderate dilation of ascending aorta measuring 45 mm. FINDINGS  Left Ventricle: Left ventricular ejection fraction, by estimation, is 60 to 65%. The left ventricle has normal function. The left ventricle has no regional wall motion abnormalities. The left ventricular internal cavity size was normal in size. There is  no left ventricular hypertrophy. Left ventricular diastolic function could not be evaluated due to atrial fibrillation. Left ventricular diastolic function could not be evaluated. Right Ventricle: The right ventricular size is normal. Right vetricular wall thickness was not well visualized. Right ventricular systolic function is normal. There is normal pulmonary artery systolic pressure. The tricuspid regurgitant velocity is 2.57 m/s, and with an assumed right atrial pressure of 8 mmHg, the estimated right ventricular systolic pressure is 73.5 mmHg. Left Atrium: Left atrial size was mildly dilated. Right Atrium: Right atrial size was mildly dilated. Pericardium: There is no evidence of pericardial effusion. Mitral Valve: The mitral valve is normal in structure. Mild mitral valve regurgitation. No evidence of mitral valve stenosis. Tricuspid Valve: The tricuspid valve is normal in  structure. Tricuspid valve regurgitation is mild . No evidence of tricuspid stenosis. Aortic Valve: The aortic valve is tricuspid. There is mild calcification of the aortic valve. Aortic valve regurgitation is trivial. Aortic valve sclerosis is present, with no evidence of aortic valve stenosis. Pulmonic Valve: The pulmonic valve  was not well visualized. Pulmonic valve regurgitation is not visualized. No evidence of pulmonic stenosis. Aorta: Aortic dilatation noted. There is moderate dilatation of the ascending aorta, measuring 45 mm. Venous: The inferior vena cava is dilated in size with greater than 50% respiratory variability, suggesting right atrial pressure of 8 mmHg. IAS/Shunts: The atrial septum is grossly normal.  LEFT VENTRICLE PLAX 2D LVIDd:         3.75 cm LVIDs:         2.30 cm LV PW:         0.95 cm LV IVS:        0.90 cm LVOT diam:     2.50 cm LV SV:         70 LV SV Index:   39 LVOT Area:     4.91 cm  LV Volumes (MOD) LV vol d, MOD A2C: 78.0 ml LV vol d, MOD A4C: 64.8 ml LV vol s, MOD A2C: 31.4 ml LV vol s, MOD A4C: 24.0 ml LV SV MOD A2C:     46.6 ml LV SV MOD A4C:     64.8 ml LV SV MOD BP:      44.2 ml RIGHT VENTRICLE            IVC RV S prime:     9.46 cm/s  IVC diam: 2.20 cm TAPSE (M-mode): 2.2 cm LEFT ATRIUM             Index        RIGHT ATRIUM           Index LA diam:        3.50 cm 1.98 cm/m   RA Area:     18.10 cm LA Vol (A2C):   57.1 ml 32.27 ml/m  RA Volume:   50.60 ml  28.59 ml/m LA Vol (A4C):   57.0 ml 32.21 ml/m LA Biplane Vol: 56.9 ml 32.15 ml/m  AORTIC VALVE LVOT Vmax:   93.60 cm/s LVOT Vmean:  65.400 cm/s LVOT VTI:    0.142 m  AORTA Ao Root diam: 3.70 cm Ao Asc diam:  4.16 cm MITRAL VALVE                  TRICUSPID VALVE MV Area (PHT): 5.44 cm       TR Peak grad:   26.4 mmHg MV Decel Time: 139 msec       TR Vmax:        257.00 cm/s MR Peak grad:    73.6 mmHg MR Mean grad:    52.0 mmHg    SHUNTS MR Vmax:         429.00 cm/s  Systemic VTI:  0.14 m MR Vmean:        348.0 cm/s    Systemic Diam: 2.50 cm MR PISA:         0.57 cm MR PISA Eff ROA: 5 mm MR PISA Radius:  0.30 cm MV E velocity: 96.07 cm/s Buford Dresser MD Electronically signed by Buford Dresser MD Signature Date/Time: 02/07/2022/2:03:02 PM    Final    DG Chest Port 1 View  Result Date: 02/07/2022 CLINICAL DATA:  88 year old male with history of chest pain and cough. EXAM: PORTABLE CHEST 1 VIEW COMPARISON:  Chest x-ray 12/02/2021. FINDINGS: Bibasilar opacities may reflect areas of atelectasis and/or consolidation with superimposed small left pleural effusion. No definite right pleural effusion. No pneumothorax. No definite suspicious appearing pulmonary nodules or masses are noted. Heart size is normal. Upper mediastinal  contours are within normal limits. Atherosclerotic calcifications in the thoracic aorta. IMPRESSION: 1. Bibasilar areas of atelectasis and/or consolidation. 2. Small left pleural effusion. 3. Aortic atherosclerosis. Electronically Signed   By: Vinnie Langton M.D.   On: 02/07/2022 05:01    Cardiac Studies   Echo:    1. Left ventricular ejection fraction, by estimation, is 60 to 65%. The  left ventricle has normal function. The left ventricle has no regional  wall motion abnormalities. Left ventricular diastolic function could not  be evaluated.   2. Right ventricular systolic function is normal. The right ventricular  size is normal. There is normal pulmonary artery systolic pressure.   3. Left atrial size was mildly dilated.   4. Right atrial size was mildly dilated.   5. The mitral valve is normal in structure. Mild mitral valve  regurgitation. No evidence of mitral stenosis.   6. The aortic valve is tricuspid. There is mild calcification of the  aortic valve. Aortic valve regurgitation is trivial. Aortic valve  sclerosis is present, with no evidence of aortic valve stenosis.   7. Aortic dilatation noted. There is moderate dilatation of the ascending  aorta, measuring 45  mm.   8. The inferior vena cava is dilated in size with >50% respiratory  variability, suggesting right atrial pressure of 8 mmHg.   Patient Profile     88 y.o. male with a hx of CAD with PCI 2008,  neg stress tests since, hx of PVCs and atrial tach, NSVT, dilated aorta 47 mm, hx GI bleed, atrial fib ?resolved who is being seen 02/07/2022 for the evaluation of  chest pain at the request of Dr. Rogers Blocker. .    Assessment & Plan    Atrial fib:  NSR overnight.  PAF.  No change in therapy.  No anticoagulation with history of GI  bleed.  No plan for Watchman.    Elevated troponin:  Secondary to atrial fib.  No change in therapy or plans for further evaluation.    For questions or updates, please contact [place] Please consult [email or web address] for contact info under Cardiology/STEMI.   Signed, Minus Breeding, MD  02/08/2022, 8:50 AM

## 2022-02-08 NOTE — Evaluation (Signed)
Physical Therapy Evaluation Patient Details Name: [name] MRN: [id number] DOB: January 07, 1933 Today's Date: 02/08/2022  History of Present Illness  Patient is a 88 y/o male who presents in 12/11 with chest discomfort and fall. Found to be in A-fib with RVR, left pleural effusion, bibasilar atelectasis. + RSV. PMH includes CAD, neuropathy, COPD, bil THA, depression, ascending aortic aneurysm.  Clinical Impression  Patient presents with dyspnea on exertion, decreased activity tolerance and impaired mobility s/p above. Pt lives at home alone and uses SPC vs RW for ambulation, drives and does some IADLs. Has family that lives ~1 hour away as well as good, supportive neighbors that can assist as needed. Today, pt tolerated transfers and ambulation with supervision for safety with 2-3/4 DOE and SP02 >90% on RA. Pt with unproductive cough, worsened with mobility. Encouraged pt to have family stay with him for a few days until he feels better and gets more energy and pt reports this is possible. Pt participates in an exercise program daily and walks as well so declining HH services. Will follow acutely to maximize independence and mobility prior to return home.       Recommendations for follow up therapy are one component of a multi-disciplinary discharge planning process, led by the attending physician.  Recommendations may be updated based on patient status, additional functional criteria and insurance authorization.  Follow Up Recommendations No PT follow up (declining HH services)      Assistance Recommended at Discharge PRN  Patient can return home with the following  Assistance with cooking/housework;A little help with bathing/dressing/bathroom;Assist for transportation    Equipment Recommendations None recommended by PT  Recommendations for Other Services       Functional Status Assessment Patient has had a recent decline in their functional status and demonstrates the ability to make  significant improvements in function in a reasonable and predictable amount of time.     Precautions / Restrictions Precautions Precautions: Other (comment);Fall Precaution Comments: watch 02 Restrictions Weight Bearing Restrictions: No      Mobility  Bed Mobility Overal bed mobility: Needs Assistance Bed Mobility: Supine to Sit     Supine to sit: Supervision, HOB elevated     General bed mobility comments: Supervision for safety.    Transfers Overall transfer level: Needs assistance Equipment used: Rolling walker (2 wheels) Transfers: Sit to/from Stand Sit to Stand: Supervision           General transfer comment: Supervision for safety. Stood from [employer], transferred to chair post ambulation.    Ambulation/Gait Ambulation/Gait assistance: Supervision Gait Distance (Feet): 150 Feet Assistive device: Rolling walker (2 wheels) Gait Pattern/deviations: Step-through pattern, Decreased stride length Gait velocity: decreased Gait velocity interpretation: 1.31 - 2.62 ft/sec, indicative of limited community ambulator   General Gait Details: Slow, steady gait with RW for support, unproductive cough worsened with mobility, 2-3/4 DOE. SP02 remained >90% on RA.  Stairs            Wheelchair Mobility    Modified Rankin (Stroke Patients Only)       Balance Overall balance assessment: Mild deficits observed, not formally tested                                           Pertinent Vitals/Pain Pain Assessment Pain Assessment: No/denies pain    Home Living Family/patient expects to be discharged to:: (P) Private residence Living  Arrangements: (P) Alone Available Help at Discharge: Family;Available PRN/intermittently;Neighbor Type of Home: (P) House Home Access: (P) Stairs to enter;Ramped entrance   Entrance Stairs-Number of Steps: (P) 4 Alternate Level Stairs-Number of Steps: (P) basement Home Layout: (P) One level Home Equipment: (P) Cane  - single point;Rollator (4 wheels);Rolling Walker (2 wheels);Grab bars - tub/shower Additional Comments: (P) has a walk in tub if needed    Prior Function Prior Level of Function : (P) Independent/Modified Independent             Mobility Comments: uses a cane for community and RW for household, no fall since July, does workout routine every morning with exercises- squats/pushups and walks .25 miles as well. ADLs Comments: independent with ADLs, drives, does household chores     Hand Dominance   Dominant Hand: (P) Right    Extremity/Trunk Assessment   Upper Extremity Assessment Upper Extremity Assessment: Defer to OT evaluation    Lower Extremity Assessment Lower Extremity Assessment: Overall WFL for tasks assessed    Cervical / Trunk Assessment Cervical / Trunk Assessment: Kyphotic  Communication   Communication: (P) No difficulties  Cognition Arousal/Alertness: Awake/alert Behavior During Therapy: WFL for tasks assessed/performed Overall Cognitive Status: Within Functional Limits for tasks assessed                                 General Comments: for basic mobility tasks        General Comments General comments (skin integrity, edema, etc.): VSS on RA with Sp02 >90% with activity.    Exercises     Assessment/Plan    PT Assessment Patient needs continued PT services  PT Problem List Decreased mobility;Cardiopulmonary status limiting activity;Decreased activity tolerance       PT Treatment Interventions Therapeutic exercise;Gait training;Balance training;Therapeutic activities;Patient/family education    PT Goals (Current goals can be found in the Care Plan section)  Acute Rehab PT Goals Patient Stated Goal: to go home PT Goal Formulation: With patient Time For Goal Achievement: 02/22/22 Potential to Achieve Goals: Good    Frequency Min 3X/week     Co-evaluation               AM-PAC PT "6 Clicks" Mobility  Outcome Measure  Help needed turning from your back to your side while in a flat bed without using bedrails?: A Little Help needed moving from lying on your back to sitting on the side of a flat bed without using bedrails?: A Little Help needed moving to and from a bed to a chair (including a wheelchair)?: A Little Help needed standing up from a chair using your arms (e.g., wheelchair or bedside chair)?: A Little Help needed to walk in hospital room?: A Little Help needed climbing 3-5 steps with a railing? : A Little 6 Click Score: 18    End of Session Equipment Utilized During Treatment: Gait belt Activity Tolerance: Patient tolerated treatment well Patient left: in chair;with call bell/phone within reach;with chair alarm set Nurse Communication: Mobility status PT Visit Diagnosis: Difficulty in walking, not elsewhere classified (R26.2);Other (comment) (DOE)    Time: 4696-2952 PT Time Calculation (min) (ACUTE ONLY): 19 min   Charges:   PT Evaluation $PT Eval Moderate Complexity: 1 Mod          Marisa Severin, PT, DPT Acute Rehabilitation Services Secure chat preferred Office [place] 02/08/2022, 10:51 AM

## 2022-02-08 NOTE — Discharge Summary (Signed)
Physician Discharge Summary   Patient: [name] MRN: [id number] DOB: Nov 04, 1932  Admit date:     02/07/2022  Discharge date: 02/08/22  Discharge Physician: Patrecia Pour   PCP: Dettinger, [name], MD   Recommendations at discharge:  Follow up with PCP in the next week. Admitted for symptoms attributable to RSV pneumonitis provoking AFib with RVR which has improved by restarting patient's metoprolol which he had not been taking. Discharged with plans for antitussives and to continue home supplemental oxygen with home health therapies. Note the patient declined home health therapies. Follow up optionally for aortic dilatation. Pt aware.   Discharge Diagnoses: Principal Problem:   Chest pain Active Problems:   Afib (HCC)   Diastolic CHF (HCC)   Leukocytosis   COPD (chronic obstructive pulmonary disease) (HCC)   Hyperprolactinemia/pituitary adenoma   Hypothyroidism   Hyperlipidemia LDL goal <70   Thoracic ascending aortic aneurysm (HCC)   Gastroesophageal reflux disease with esophagitis   BPH (benign prostatic hyperplasia)   Macrocytic anemia/blood loss anemia   CAD (coronary artery disease)  Resolved Problems:   Acquired dilation of ascending aorta and aortic root [facility])  Hospital Course: [name] is an 88 y.o. male with a history of CAD s/p PCI 2008, atrial tachycardia, AFib, PVCs, NSVT, aorta dilatation, GI bleed precluding anticoagulation who presented to the ED with chest pain ultimately testing positive for RSV with ongoing cough. He was treated supportively with supplemental oxygen which he has at home and takes as needed, as well as antitussives. Cardiology was consulted, advised against any further inpatient work up, monitoring, or treatment. He has improved and wishes to continue recovering under supervision and assistance of family at home. Please see below for details and discussion.   Assessment and Plan: The patient strongly desires discharge home. He feels  better, is breathing without effort with supplemental oxygen which he has at home. AFib controlled/converted with reinitiation of his metoprolol and has no anginal complaints. He has a caregiver who comes during the day, has a former Therapist, sports and a current paramedic (SIL) nearby who will be checking on him and can stay with him if needed. I offered to keep him inpatient for monitoring and continued bronchodilator administration but he declines this. He's had abnormal thoughts and hallucinations with prednisone in the past and really does not want to trial any steroid at this time. He has a nebulizer at home and will continue albuterol around the clock x48 hours. He is at high risk for return to ED but has firm understanding of return precautions and is reasonably stabilized for discharge at this time.   Update: He worked with PT and actually maintained SpO2 >90% on room air throughout, felt he was near his baseline and declines home health.     Chest pain with demand myocardial ischemia due to AFib: No ACS. Chest pain improved. Had a negative stress perfusion study in 2017. Echo has normal LV systolic function and no RWMA.  - Continue metoprolol -not AC due to high risk of bleeds/falls -no longer on ASA due to recurrent GI bleeding  -not on statin allergy, LDL in October was 82   PAF: NSR overnight, no anticoagulation because of his bleeding and fall risk. There is concern he may have stopped metoprolol which he is encouraged to continue taking. History of GI bleed in 09/2021 and 11/2021  Was on ASA, stopped due to recurrent GI bleeds. Not Watchman candidate.  Leukocytosis: Suspected to be reactive and accentuated by  mild hemoconcentration, resolved without antibiotic.   Chronic HFpEF: Euvolemic - No changes to home Tx.   AECOPD due to RSV infection: Some rhonchi, though as discussed above, will be treated with inhaled Tx and forgo steroid at this time.    Hyperprolactinemia/pituitary adenoma His  most recent MRI showed stable finding of pituitary lesion which is approximately 1.4 cm x 2 cm x 1.5 cm in January 2023 continue cabergoline 0.5 mg twice a week, was not completely responsive to bromocriptine.  Followed by endocrinology   Hypothyroidism: TSH wnl. Continue home dose of synthroid, 38mg daily   Hyperlipidemia LDL goal <70 LDL of 82, allergy to statin   Thoracic ascending aortic aneurysm ([place] Followed by cardiology, last note in 11/23-This was 43 mm in April of last 2022.  He has not wanted surgery.  I will not plan further imaging.     Gastroesophageal reflux disease with esophagitis Continue protonix BID and carafate   BPH (benign prostatic hyperplasia) Continue flomax   Macrocytic anemia, recovering blood loss anemia At baseline of 10 Trending upward with oral iron and iron infusion s/p GI bleed in August and October of this year  Continue oral iron   Consultants: Cardiology Procedures performed: None  Disposition: Home Diet recommendation:  Discharge Diet Orders (From admission, onward)     Start     Ordered   02/08/22 0000  Diet - low sodium heart healthy        02/08/22 0932          DISCHARGE MEDICATION: Allergies as of 02/08/2022       Reactions   Gadavist [gadobutrol] Hives   Prednisone    Agitation and hallucinations   Betadine [povidone Iodine] Other (See Comments)   Blisters    Lortab [hydrocodone-acetaminophen] Nausea And Vomiting   Penicillins Other (See Comments)   Lightheadedness  Has patient had a PCN reaction causing immediate rash, facial/tongue/throat swelling, SOB or lightheadedness with hypotension: Yes Has patient had a PCN reaction causing severe rash involving mucus membranes or skin necrosis: No Has patient had a PCN reaction that required hospitalization No Has patient had a PCN reaction occurring within the last 10 years: No If all of the above answers are "NO", then may proceed with Cephalosporin use.   Zetia  [ezetimibe] Other (See Comments)   Muscle weakness    Zocor [simvastatin] Nausea Only, Other (See Comments)   Muscle weakness         Medication List     TAKE these medications    albuterol (2.5 MG/3ML) 0.083% nebulizer solution Commonly known as: PROVENTIL Take 3 mLs (2.5 mg total) by nebulization every 4 (four) hours as needed for wheezing or shortness of breath.   budesonide-formoterol 160-4.5 MCG/ACT inhaler Commonly known as: Symbicort Inhale 2 puffs into the lungs in the morning and at bedtime.   cabergoline 0.5 MG tablet Commonly known as: DOSTINEX Take 1 tablet (0.5 mg total) by mouth 2 (two) times a week.   calcium carbonate 1250 (500 Ca) MG tablet Commonly known as: OS-CAL - dosed in mg of elemental calcium Take 1 tablet by mouth daily with breakfast.   cholecalciferol 1000 units tablet Commonly known as: VITAMIN D Take 1,000 Units by mouth daily.   cyanocobalamin 1000 MCG tablet Commonly known as: VITAMIN B12 Take 1,000 mcg by mouth daily.   diclofenac Sodium 1 % Gel Commonly known as: VOLTAREN Apply 2 g topically 4 (four) times daily.   diphenhydrAMINE 25 MG tablet Commonly known as: BENADRYL Take  25 mg by mouth every 6 (six) hours as needed for itching.   diphenhydramine-acetaminophen 25-500 MG Tabs tablet Commonly known as: TYLENOL PM Take 1/2 tablet by mouth at bedtime as needed for sleep   FeroSul 325 (65 FE) MG tablet Generic drug: ferrous sulfate TAKE (1) TABLET DAILY WITH BREAKFAST. What changed: See the new instructions.   Flutter Devi Twice a day and prn as needed, may increase if feeling worse   GaviLAX 17 GM/SCOOP powder Generic drug: polyethylene glycol powder DISSOLVE 17 GM SCOOP IN 8 OZ. OF WATER AND DRINK ONCE DAILY AS NEEDED FOR CONSTIPATION What changed: See the new instructions.   guaiFENesin 600 MG 12 hr tablet Commonly known as: Mucinex Take 1 tablet (600 mg total) by mouth 2 (two) times daily.   levalbuterol 45 MCG/ACT  inhaler Commonly known as: XOPENEX HFA Inhale 1 puff into the lungs every 4 (four) hours as needed for wheezing.   levothyroxine 75 MCG tablet Commonly known as: SYNTHROID Take 1 tablet (75 mcg total) by mouth daily before breakfast.   magnesium oxide 400 (241.3 Mg) MG tablet Commonly known as: MAG-OX Take 1 tablet (400 mg total) by mouth 2 (two) times daily.   Melatonin 5 MG Caps Take 1 capsule by mouth at bedtime.   Metamucil 28 % packet Generic drug: psyllium Take 1 packet by mouth daily.   metoprolol succinate 25 MG 24 hr tablet Commonly known as: Toprol XL Take 1 tablet (25 mg total) by mouth daily.   naphazoline-pheniramine 0.025-0.3 % ophthalmic solution Commonly known as: NAPHCON-A Place 1 drop into the left eye 2 (two) times daily as needed for irritation or allergies.   nitroGLYCERIN 0.4 MG SL tablet Commonly known as: NITROSTAT Place 1 tablet (0.4 mg total) under the tongue every 5 (five) minutes as needed for chest pain.   pantoprazole 40 MG tablet Commonly known as: PROTONIX Take 1 tablet (40 mg total) by mouth 2 (two) times daily.   sodium chloride HYPERTONIC 3 % nebulizer solution Take by nebulization as needed for other.   sucralfate 1 g tablet Commonly known as: CARAFATE Take 1 tablet (1 g total) by mouth 4 (four) times daily -  with meals and at bedtime.   tamsulosin 0.4 MG Caps capsule Commonly known as: FLOMAX Take 1 capsule (0.4 mg total) by mouth daily after supper.   traMADol 50 MG tablet Commonly known as: ULTRAM Take 1 tablet (50 mg total) by mouth 2 (two) times daily as needed. What changed: reasons to take this   triamcinolone cream 0.1 % Commonly known as: KENALOG Apply 1 application topically 2 (two) times daily.   Turmeric 500 MG Caps Take 1 capsule by mouth daily.   Vitamin C 500 MG Caps Take 1 capsule by mouth daily.   vitamin E 180 MG (400 UNITS) capsule Take 400 Units by mouth daily.        Follow-up Information      Dettinger, [name], MD. Schedule an appointment as soon as possible for a visit in 1 week(s).   Specialties: Family Medicine, Cardiology Contact information: [place] [address] [phone]         Minus Breeding, MD Follow up.   Specialty: Cardiology Contact information: [address] [place] [address] [phone]         Tanda Rockers, MD .   Specialty: Pulmonary Disease Contact information: [place] [address] [phone]  Discharge Exam: Filed Weights   02/07/22 0420 02/07/22 1553 02/08/22 0126  Weight: 68 kg 67.8 kg 69.9 kg  BP (!) 112/51 (BP Location: Right Arm)   Pulse 77   Temp 99.6 F (37.6 C) (Oral)   Resp 19   Ht '5\' 6"'$  (1.676 m)   Wt 69.9 kg   SpO2 90%   BMI 24.87 kg/m   Elderly male in no distress with frequent cough Regular with PACS, no MRG or pitting edema Nonlabored, rhonchi diffusely with good air movement. +BS, soft, NT, ND Alert, oriented without focal deficits.   Condition at discharge: stable  The results of significant diagnostics from this hospitalization (including imaging, microbiology, ancillary and laboratory) are listed below for reference.   Imaging Studies: ECHOCARDIOGRAM COMPLETE  Result Date: 02/07/2022    ECHOCARDIOGRAM REPORT   Patient Name:   [name] Date of Exam: 02/07/2022 Medical Rec #:  [id number]       Height:       66.0 in Accession #:    [id number]      Weight:       150.0 lb Date of Birth:  [birth year]       BSA:          1.770 m Patient Age:    36 years        BP:           102/58 mmHg Patient Gender: M               HR:           126 bpm. Exam Location:  Inpatient Procedure: 2D Echo, Cardiac Doppler and Color Doppler Indications:    R07.9* Chest pain, unspecified  History:        Patient has prior history of Echocardiogram examinations, most                 recent 10/01/2021. CHF, CAD, Abnormal ECG, COPD, Arrythmias:Atrial                  Fibrillation, Signs/Symptoms:Chest Pain; Risk                 Factors:Dyslipidemia. Ascending aortic aneurysm.  Sonographer:    Roseanna Rainbow RDCS Referring Phys: Orma Flaming  Sonographer Comments: Patient coughing throughout exam. IMPRESSIONS  1. Left ventricular ejection fraction, by estimation, is 60 to 65%. The left ventricle has normal function. The left ventricle has no regional wall motion abnormalities. Left ventricular diastolic function could not be evaluated.  2. Right ventricular systolic function is normal. The right ventricular size is normal. There is normal pulmonary artery systolic pressure.  3. Left atrial size was mildly dilated.  4. Right atrial size was mildly dilated.  5. The mitral valve is normal in structure. Mild mitral valve regurgitation. No evidence of mitral stenosis.  6. The aortic valve is tricuspid. There is mild calcification of the aortic valve. Aortic valve regurgitation is trivial. Aortic valve sclerosis is present, with no evidence of aortic valve stenosis.  7. Aortic dilatation noted. There is moderate dilatation of the ascending aorta, measuring 45 mm.  8. The inferior vena cava is dilated in size with >50% respiratory variability, suggesting right atrial pressure of 8 mmHg. Comparison(s): Prior images reviewed side by side. Conclusion(s)/Recommendation(s): Moderate dilation of ascending aorta measuring 45 mm. FINDINGS  Left Ventricle: Left ventricular ejection fraction, by estimation, is 60 to 65%. The left ventricle has normal function. The left ventricle has no regional wall motion abnormalities. The  left ventricular internal cavity size was normal in size. There is  no left ventricular hypertrophy. Left ventricular diastolic function could not be evaluated due to atrial fibrillation. Left ventricular diastolic function could not be evaluated. Right Ventricle: The right ventricular size is normal. Right vetricular wall thickness was not well visualized. Right  ventricular systolic function is normal. There is normal pulmonary artery systolic pressure. The tricuspid regurgitant velocity is 2.57 m/s, and with an assumed right atrial pressure of 8 mmHg, the estimated right ventricular systolic pressure is 95.6 mmHg. Left Atrium: Left atrial size was mildly dilated. Right Atrium: Right atrial size was mildly dilated. Pericardium: There is no evidence of pericardial effusion. Mitral Valve: The mitral valve is normal in structure. Mild mitral valve regurgitation. No evidence of mitral valve stenosis. Tricuspid Valve: The tricuspid valve is normal in structure. Tricuspid valve regurgitation is mild . No evidence of tricuspid stenosis. Aortic Valve: The aortic valve is tricuspid. There is mild calcification of the aortic valve. Aortic valve regurgitation is trivial. Aortic valve sclerosis is present, with no evidence of aortic valve stenosis. Pulmonic Valve: The pulmonic valve was not well visualized. Pulmonic valve regurgitation is not visualized. No evidence of pulmonic stenosis. Aorta: Aortic dilatation noted. There is moderate dilatation of the ascending aorta, measuring 45 mm. Venous: The inferior vena cava is dilated in size with greater than 50% respiratory variability, suggesting right atrial pressure of 8 mmHg. IAS/Shunts: The atrial septum is grossly normal.  LEFT VENTRICLE PLAX 2D LVIDd:         3.75 cm LVIDs:         2.30 cm LV PW:         0.95 cm LV IVS:        0.90 cm LVOT diam:     2.50 cm LV SV:         70 LV SV Index:   39 LVOT Area:     4.91 cm  LV Volumes (MOD) LV vol d, MOD A2C: 78.0 ml LV vol d, MOD A4C: 64.8 ml LV vol s, MOD A2C: 31.4 ml LV vol s, MOD A4C: 24.0 ml LV SV MOD A2C:     46.6 ml LV SV MOD A4C:     64.8 ml LV SV MOD BP:      44.2 ml RIGHT VENTRICLE            IVC RV S prime:     9.46 cm/s  IVC diam: 2.20 cm TAPSE (M-mode): 2.2 cm LEFT ATRIUM             Index        RIGHT ATRIUM           Index LA diam:        3.50 cm 1.98 cm/m   RA Area:      18.10 cm LA Vol (A2C):   57.1 ml 32.27 ml/m  RA Volume:   50.60 ml  28.59 ml/m LA Vol (A4C):   57.0 ml 32.21 ml/m LA Biplane Vol: 56.9 ml 32.15 ml/m  AORTIC VALVE LVOT Vmax:   93.60 cm/s LVOT Vmean:  65.400 cm/s LVOT VTI:    0.142 m  AORTA Ao Root diam: 3.70 cm Ao Asc diam:  4.16 cm MITRAL VALVE                  TRICUSPID VALVE MV Area (PHT): 5.44 cm       TR Peak grad:   26.4 mmHg MV Decel Time: 139  msec       TR Vmax:        257.00 cm/s MR Peak grad:    73.6 mmHg MR Mean grad:    52.0 mmHg    SHUNTS MR Vmax:         429.00 cm/s  Systemic VTI:  0.14 m MR Vmean:        348.0 cm/s   Systemic Diam: 2.50 cm MR PISA:         0.57 cm MR PISA Eff ROA: 5 mm MR PISA Radius:  0.30 cm MV E velocity: 96.07 cm/s Buford Dresser MD Electronically signed by Buford Dresser MD Signature Date/Time: 02/07/2022/2:03:02 PM    Final    DG Chest Port 1 View  Result Date: 02/07/2022 CLINICAL DATA:  88 year old male with history of chest pain and cough. EXAM: PORTABLE CHEST 1 VIEW COMPARISON:  Chest x-ray 12/02/2021. FINDINGS: Bibasilar opacities may reflect areas of atelectasis and/or consolidation with superimposed small left pleural effusion. No definite right pleural effusion. No pneumothorax. No definite suspicious appearing pulmonary nodules or masses are noted. Heart size is normal. Upper mediastinal contours are within normal limits. Atherosclerotic calcifications in the thoracic aorta. IMPRESSION: 1. Bibasilar areas of atelectasis and/or consolidation. 2. Small left pleural effusion. 3. Aortic atherosclerosis. Electronically Signed   By: Vinnie Langton M.D.   On: 02/07/2022 05:01    Microbiology: Results for orders placed or performed during the hospital encounter of 02/07/22  Resp panel by RT-PCR (RSV, Flu A&B, Covid) Anterior Nasal Swab     Status: Abnormal   Collection Time: 02/07/22 12:28 PM   Specimen: Anterior Nasal Swab  Result Value Ref Range Status   SARS Coronavirus 2 by RT PCR  NEGATIVE NEGATIVE Final    Comment: (NOTE) SARS-CoV-2 target nucleic acids are NOT DETECTED.  The SARS-CoV-2 RNA is generally detectable in upper respiratory specimens during the acute phase of infection. The lowest concentration of SARS-CoV-2 viral copies this assay can detect is 138 copies/mL. A negative result does not preclude SARS-Cov-2 infection and should not be used as the sole basis for treatment or other patient management decisions. A negative result may occur with  improper specimen collection/handling, submission of specimen other than nasopharyngeal swab, presence of viral mutation(s) within the areas targeted by this assay, and inadequate number of viral copies(<138 copies/mL). A negative result must be combined with clinical observations, patient history, and epidemiological information. The expected result is Negative.  Fact Sheet for Patients:  EntrepreneurPulse.com.au  Fact Sheet for Healthcare Providers:  IncredibleEmployment.be  This test is no t yet approved or cleared by the [place] FDA and  has been authorized for detection and/or diagnosis of SARS-CoV-2 by FDA under an Emergency Use Authorization (EUA). This EUA will remain  in effect (meaning this test can be used) for the duration of the COVID-19 declaration under Section 564(b)(1) of the Act, 21 U.S.C.section 360bbb-3(b)(1), unless the authorization is terminated  or revoked sooner.       Influenza A by PCR NEGATIVE NEGATIVE Final   Influenza B by PCR NEGATIVE NEGATIVE Final    Comment: (NOTE) The Xpert Xpress SARS-CoV-2/FLU/RSV plus assay is intended as an aid in the diagnosis of influenza from Nasopharyngeal swab specimens and should not be used as a sole basis for treatment. Nasal washings and aspirates are unacceptable for Xpert Xpress SARS-CoV-2/FLU/RSV testing.  Fact Sheet for Patients: EntrepreneurPulse.com.au  Fact Sheet for  Healthcare Providers: IncredibleEmployment.be  This test is not yet approved or cleared by the  [place] Architectural technologist and has been authorized for detection and/or diagnosis of SARS-CoV-2 by FDA under an Print production planner (EUA). This EUA will remain in effect (meaning this test can be used) for the duration of the COVID-19 declaration under Section 564(b)(1) of the Act, 21 U.S.C. section 360bbb-3(b)(1), unless the authorization is terminated or revoked.     Resp Syncytial Virus by PCR POSITIVE (A) NEGATIVE Final    Comment: (NOTE) Fact Sheet for Patients: EntrepreneurPulse.com.au  Fact Sheet for Healthcare Providers: IncredibleEmployment.be  This test is not yet approved or cleared by the [place] FDA and has been authorized for detection and/or diagnosis of SARS-CoV-2 by FDA under an Emergency Use Authorization (EUA). This EUA will remain in effect (meaning this test can be used) for the duration of the COVID-19 declaration under Section 564(b)(1) of the Act, 21 U.S.C. section 360bbb-3(b)(1), unless the authorization is terminated or revoked.  Performed at [facility], [place] [address], [place], [place] [address]    *Note: Due to a large number of results and/or encounters for the requested time period, some results have not been displayed. A complete set of results can be found in Results Review.    Labs: CBC: Recent Labs  Lab 02/07/22 0454 02/08/22 0037  WBC 15.8* 10.4  HGB 10.1* 9.6*  HCT 31.7* 30.1*  MCV 90.1 89.6  PLT 220 591   Basic Metabolic Panel: Recent Labs  Lab 02/07/22 0454 02/07/22 1313 02/08/22 0037  NA 135  --  132*  K 3.8  --  3.9  CL 106  --  106  CO2 19*  --  18*  GLUCOSE 123*  --  98  BUN 13  --  16  CREATININE 1.17  --  1.18  CALCIUM 8.3*  --  7.9*  MG  --  1.7  --    Liver Function Tests: No results for input(s): "AST", "ALT", "ALKPHOS", "BILITOT", "PROT", "ALBUMIN"  in the last 168 hours. CBG: No results for input(s): "GLUCAP" in the last 168 hours.  Discharge time spent: greater than 30 minutes.  Signed: Patrecia Pour, MD [facility] 02/08/2022

## 2022-02-08 NOTE — Care Management Obs Status (Signed)
[place] NOTIFICATION   Patient Details  Name: [name] MRN: [id number] Date of Birth: 22-Nov-1932   Medicare Observation Status Notification Given:  Yes    Zenon Mayo, RN 02/08/2022, 10:02 AM

## 2022-02-09 ENCOUNTER — Other Ambulatory Visit: Payer: Self-pay

## 2022-02-09 ENCOUNTER — Telehealth: Payer: Self-pay

## 2022-02-09 ENCOUNTER — Emergency Department (HOSPITAL_COMMUNITY): Payer: Medicare Other

## 2022-02-09 ENCOUNTER — Encounter (HOSPITAL_COMMUNITY): Payer: Self-pay | Admitting: Emergency Medicine

## 2022-02-09 ENCOUNTER — Telehealth: Payer: Self-pay | Admitting: Family Medicine

## 2022-02-09 ENCOUNTER — Observation Stay (HOSPITAL_COMMUNITY)
Admission: EM | Admit: 2022-02-09 | Discharge: 2022-02-10 | Disposition: A | Discharge status: Home / Self Care | Payer: Medicare Other | Attending: Internal Medicine | Admitting: Internal Medicine

## 2022-02-09 DIAGNOSIS — I5032 Chronic diastolic (congestive) heart failure: Secondary | ICD-10-CM

## 2022-02-09 DIAGNOSIS — Z96643 Presence of artificial hip joint, bilateral: Secondary | ICD-10-CM | POA: Insufficient documentation

## 2022-02-09 DIAGNOSIS — I2510 Atherosclerotic heart disease of native coronary artery without angina pectoris: Secondary | ICD-10-CM | POA: Insufficient documentation

## 2022-02-09 DIAGNOSIS — J449 Chronic obstructive pulmonary disease, unspecified: Secondary | ICD-10-CM | POA: Diagnosis present

## 2022-02-09 DIAGNOSIS — Z955 Presence of coronary angioplasty implant and graft: Secondary | ICD-10-CM | POA: Insufficient documentation

## 2022-02-09 DIAGNOSIS — Z85820 Personal history of malignant melanoma of skin: Secondary | ICD-10-CM | POA: Diagnosis not present

## 2022-02-09 DIAGNOSIS — E039 Hypothyroidism, unspecified: Secondary | ICD-10-CM | POA: Insufficient documentation

## 2022-02-09 DIAGNOSIS — J45909 Unspecified asthma, uncomplicated: Secondary | ICD-10-CM | POA: Diagnosis not present

## 2022-02-09 DIAGNOSIS — Z79899 Other long term (current) drug therapy: Secondary | ICD-10-CM | POA: Diagnosis not present

## 2022-02-09 DIAGNOSIS — I5030 Unspecified diastolic (congestive) heart failure: Secondary | ICD-10-CM | POA: Diagnosis present

## 2022-02-09 DIAGNOSIS — J210 Acute bronchiolitis due to respiratory syncytial virus: Secondary | ICD-10-CM | POA: Diagnosis not present

## 2022-02-09 DIAGNOSIS — E871 Hypo-osmolality and hyponatremia: Secondary | ICD-10-CM | POA: Diagnosis not present

## 2022-02-09 DIAGNOSIS — R410 Disorientation, unspecified: Secondary | ICD-10-CM | POA: Insufficient documentation

## 2022-02-09 DIAGNOSIS — E876 Hypokalemia: Secondary | ICD-10-CM | POA: Insufficient documentation

## 2022-02-09 DIAGNOSIS — Z7951 Long term (current) use of inhaled steroids: Secondary | ICD-10-CM | POA: Diagnosis not present

## 2022-02-09 DIAGNOSIS — I4891 Unspecified atrial fibrillation: Secondary | ICD-10-CM | POA: Insufficient documentation

## 2022-02-09 DIAGNOSIS — B338 Other specified viral diseases: Secondary | ICD-10-CM | POA: Diagnosis present

## 2022-02-09 DIAGNOSIS — I480 Paroxysmal atrial fibrillation: Secondary | ICD-10-CM | POA: Diagnosis present

## 2022-02-09 DIAGNOSIS — R0602 Shortness of breath: Secondary | ICD-10-CM | POA: Diagnosis present

## 2022-02-09 LAB — CBC WITH DIFFERENTIAL/PLATELET
Abs Immature Granulocytes: 0.31 10*3/uL — ABNORMAL HIGH (ref 0.00–0.07)
Basophils Absolute: 0.1 10*3/uL (ref 0.0–0.1)
Basophils Relative: 1 %
Eosinophils Absolute: 0.1 10*3/uL (ref 0.0–0.5)
Eosinophils Relative: 1 %
HCT: 35.9 % — ABNORMAL LOW (ref 39.0–52.0)
Hemoglobin: 11.1 g/dL — ABNORMAL LOW (ref 13.0–17.0)
Immature Granulocytes: 3 %
Lymphocytes Relative: 9 %
Lymphs Abs: 0.9 10*3/uL (ref 0.7–4.0)
MCH: 27.7 pg (ref 26.0–34.0)
MCHC: 30.9 g/dL (ref 30.0–36.0)
MCV: 89.5 fL (ref 80.0–100.0)
Monocytes Absolute: 2.4 10*3/uL — ABNORMAL HIGH (ref 0.1–1.0)
Monocytes Relative: 26 %
Neutro Abs: 5.4 10*3/uL (ref 1.7–7.7)
Neutrophils Relative %: 60 %
Platelets: 249 10*3/uL (ref 150–400)
RBC: 4.01 MIL/uL — ABNORMAL LOW (ref 4.22–5.81)
RDW: 17.5 % — ABNORMAL HIGH (ref 11.5–15.5)
WBC: 9.2 10*3/uL (ref 4.0–10.5)
nRBC: 0 % (ref 0.0–0.2)

## 2022-02-09 LAB — BASIC METABOLIC PANEL
Anion gap: 8 (ref 5–15)
BUN: 19 mg/dL (ref 8–23)
CO2: 21 mmol/L — ABNORMAL LOW (ref 22–32)
Calcium: 8.1 mg/dL — ABNORMAL LOW (ref 8.9–10.3)
Chloride: 101 mmol/L (ref 98–111)
Creatinine, Ser: 1.15 mg/dL (ref 0.61–1.24)
GFR, Estimated: >60 mL/min (ref >60)
Glucose, Bld: 130 mg/dL — ABNORMAL HIGH (ref 70–99)
Potassium: 3.4 mmol/L — ABNORMAL LOW (ref 3.5–5.1)
Sodium: 130 mmol/L — ABNORMAL LOW (ref 135–145)

## 2022-02-09 LAB — TROPONIN I (HIGH SENSITIVITY)
Troponin I (High Sensitivity): 32 ng/L — ABNORMAL HIGH (ref <18)
Troponin I (High Sensitivity): 28 ng/L — ABNORMAL HIGH (ref <18)

## 2022-02-09 LAB — MAGNESIUM: Magnesium: 2 mg/dL (ref 1.7–2.4)

## 2022-02-09 MED ORDER — SODIUM CHLORIDE 0.9 % IV BOLUS (SEPSIS)
1000.0000 mL | Freq: Once | INTRAVENOUS | Status: AC
  Administered 2022-02-09: 1000 mL via INTRAVENOUS

## 2022-02-09 MED ORDER — IPRATROPIUM-ALBUTEROL 0.5-2.5 (3) MG/3ML IN SOLN
3.0000 mL | Freq: Once | RESPIRATORY_TRACT | Status: AC
  Administered 2022-02-10: 3 mL via RESPIRATORY_TRACT
  Filled 2022-02-09: qty 3

## 2022-02-09 MED ORDER — ACETAMINOPHEN 325 MG PO TABS: 650.0000 mg | ORAL_TABLET | Freq: Four times a day (QID) | ORAL | Status: DC | PRN

## 2022-02-09 MED ORDER — PANTOPRAZOLE SODIUM 40 MG PO TBEC
40.0000 mg | DELAYED_RELEASE_TABLET | Freq: Two times a day (BID) | ORAL | Status: DC
  Administered 2022-02-09 – 2022-02-10 (×2): 40 mg via ORAL
  Filled 2022-02-09 (×2): qty 1

## 2022-02-09 MED ORDER — POLYETHYLENE GLYCOL 3350 17 G PO PACK: 17.0000 g | PACK | Freq: Every day | ORAL | Status: DC | PRN

## 2022-02-09 MED ORDER — ALBUTEROL SULFATE HFA 108 (90 BASE) MCG/ACT IN AERS: 2.0000 | INHALATION_SPRAY | RESPIRATORY_TRACT | Status: DC | PRN

## 2022-02-09 MED ORDER — MOMETASONE FURO-FORMOTEROL FUM 200-5 MCG/ACT IN AERO
2.0000 | INHALATION_SPRAY | Freq: Two times a day (BID) | RESPIRATORY_TRACT | Status: DC
  Administered 2022-02-10: 2 via RESPIRATORY_TRACT
  Filled 2022-02-09: qty 8.8

## 2022-02-09 MED ORDER — IPRATROPIUM-ALBUTEROL 0.5-2.5 (3) MG/3ML IN SOLN: 3.0000 mL | RESPIRATORY_TRACT | Status: DC | PRN

## 2022-02-09 MED ORDER — ENOXAPARIN SODIUM 40 MG/0.4ML IJ SOSY
40.0000 mg | PREFILLED_SYRINGE | INTRAMUSCULAR | Status: DC
  Administered 2022-02-09: 40 mg via SUBCUTANEOUS
  Filled 2022-02-09: qty 0.4

## 2022-02-09 MED ORDER — POTASSIUM CHLORIDE CRYS ER 20 MEQ PO TBCR
20.0000 meq | EXTENDED_RELEASE_TABLET | Freq: Once | ORAL | Status: AC
  Administered 2022-02-09: 20 meq via ORAL
  Filled 2022-02-09: qty 1

## 2022-02-09 MED ORDER — METOPROLOL SUCCINATE ER 25 MG PO TB24
25.0000 mg | ORAL_TABLET | Freq: Every day | ORAL | Status: DC
  Administered 2022-02-10: 25 mg via ORAL
  Filled 2022-02-09: qty 1

## 2022-02-09 MED ORDER — LEVOTHYROXINE SODIUM 75 MCG PO TABS
75.0000 ug | ORAL_TABLET | Freq: Every day | ORAL | Status: DC
  Administered 2022-02-10: 75 ug via ORAL
  Filled 2022-02-09: qty 1

## 2022-02-09 MED ORDER — PROMETHAZINE HCL 12.5 MG PO TABS: 12.5000 mg | ORAL_TABLET | Freq: Four times a day (QID) | ORAL | Status: DC | PRN

## 2022-02-09 MED ORDER — ACETAMINOPHEN 650 MG RE SUPP: 650.0000 mg | Freq: Four times a day (QID) | RECTAL | Status: DC | PRN

## 2022-02-09 MED ORDER — IPRATROPIUM-ALBUTEROL 0.5-2.5 (3) MG/3ML IN SOLN
3.0000 mL | Freq: Once | RESPIRATORY_TRACT | Status: AC
  Administered 2022-02-09: 3 mL via RESPIRATORY_TRACT
  Filled 2022-02-09: qty 3

## 2022-02-09 NOTE — ED Triage Notes (Signed)
Pt c/o sob with activity that started yesterday. Pt wears 02 2L prn at home. Pt denies sob at present but states he would if he got up to do something and that is new. Pt just dx with RSV. Pt is a/o. Nad or resp distress at this time. Pt currently on 2L [place]. Hx of COPD. Denies chest pain/tightness. Color wnl.

## 2022-02-09 NOTE — H&P (Signed)
History and Physical    [name] HKV:[id number] DOB: [birth year] DOA: 02/09/2022  PCP: Dettinger, [name], MD   Patient coming from: Home  I have personally briefly reviewed patient's old medical records in [place]  Chief Complaint: No home o2  HPI: [name] is a 88 y.o. male with medical history significant for diastolic CHF, COPD, coronary artery disease, hypothyroidism.  Patient was hospitalized 12/11 -12/12 at [facility] for RSV pneumonitis provoking atrial fibrillation with RVR.  Patient also had chest pain which was thought secondary to demand ischemia from A-fib.  Rate improved with metoprolol.  He also had acute exacerbation of his COPD with inhaled steroid.  Patient felt better yesterday and wanted to be discharged home.  O2 sats were greater than 90% on room air throughout.  Reports he was to go home and continue with his O2 as needed.  Came back to the ED today because on getting home his oxygen tanks were empty.  He reports his breathing has improved, but he still having persistent productive cough.  He has no new complaints today.  No Chest pains.  No leg swelling.  Son also reported that patient had increasing confusion and unable to take care of himself, or perform ADLs.  Patient has an aide from a private agency that comes for a few hours 3 days a week.  ED Course: O2 sats 95 to 99% on home 2 L.  Sodium 130.  Potassium 3.4.  Troponin 32.  Magnesium 2.  Potassium 3.4.  Troponin 32 > 28.  Chest x-ray showing bilateral bibasilar linear densities unchanged from recent radiographs 12/11, both again compared to radiographs from 10/5 -Increased atelectasis versus pneumonia superimposed on chronic scarring. DuoNebs given, 1 L bolus given.   Review of Systems: As per HPI all other systems reviewed and negative.  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, currently  watching it, Dr Cyndia Bent   Anxiety    Arthritis    Asthma    Atrial fibrillation ([place])    B12 deficiency    Barrett's esophagus    CAD (coronary artery disease) 03/2006   3.0 x 20 mm TAXUS Perseus DES to the LAD; 01/2007  L main 30%, oLAD 50%, pLAD stent ok, CFX 80%, OM 60%, pRCA 60%, mRCA 70%, oPDA 90%; med rx    Cancer ([place])    skin cancer    Cataract    bilateral removal of cateracts   COLONIC POLYPS 07/17/2008   Qualifier: Diagnosis of  By: Lovette Cliche, CNA, Christy     Complication of anesthesia     no issues,but pt prefers spinal due to Pulmonary problems   COPD (chronic obstructive pulmonary disease) ([place])    oxygen  on standby in home.   Depression    Diverticulosis    Emphysema    Enlarged prostate with urinary retention    Esophageal stenosis    Gastric ulcer    GERD (gastroesophageal reflux disease)    Hiatal hernia    Hypothyroidism    Neuropathy    Osteoporosis    Pituitary macroadenoma (HCC)    Restless legs syndrome (RLS)    Tubular adenoma of colon    UGIB (upper gastrointestinal bleed) 03/2013   EGD w/ large ulcer at GE junction    Past Surgical History:  Procedure Laterality Date   ABDOMINAL HERNIA REPAIR   2008  BIOPSY  06/13/2020   Procedure: BIOPSY;  Surgeon: Montez Morita, Quillian Quince, MD;  [facility];  Service: Gastroenterology;;  gastric   BIOPSY  12/04/2021   Procedure: BIOPSY;  Surgeon: Juanita Craver, MD;  [facility];  Service: Gastroenterology;;   CARDIAC CATHETERIZATION  01/2007   L main 30%, oLAD 50%,  pLAD stent ok, CFX 80%, OM 60%, pRCA 60%, mRCA 70%, oPDA 90%; med rx   cataract extraction both eyes     COLONOSCOPY     COLONOSCOPY WITH PROPOFOL N/A 06/13/2020   Procedure: COLONOSCOPY WITH PROPOFOL;  Surgeon: Harvel Quale, MD;  [facility];  Service: Gastroenterology;  Laterality: N/A;   COLONOSCOPY WITH PROPOFOL N/A 12/04/2021   Procedure: COLONOSCOPY WITH PROPOFOL;  Surgeon: Juanita Craver, MD;   [facility];  Service: Gastroenterology;  Laterality: N/A;   CORONARY ANGIOPLASTY     CORONARY STENT PLACEMENT  03/2006   3.0 x 20 mm TAXUS Perseus DES to the LAD   CYSTOSCOPY N/A 06/10/2015   Procedure: CYSTOSCOPY FULGRATION OF BLEEDING,  electovapor resection;  Surgeon: Irine Seal, MD;  [facility];  Service: Urology;  Laterality: N/A;   CYSTOSCOPY WITH INSERTION OF UROLIFT N/A 06/01/2015   Procedure: CYSTOSCOPY WITH INSERTION OF UROLIFT x4;  Surgeon: Irine Seal, MD;  [facility];  Service: Urology;  Laterality: N/A;   ESOPHAGOGASTRODUODENOSCOPY N/A 04/20/2013   Procedure: ESOPHAGOGASTRODUODENOSCOPY (EGD);  Surgeon: Inda Castle, MD;  [facility];  Service: Endoscopy;  Laterality: N/A;   ESOPHAGOGASTRODUODENOSCOPY N/A 03/25/2016   Procedure: ESOPHAGOGASTRODUODENOSCOPY (EGD);  Surgeon: Irene Shipper, MD;  [facility];  Service: Endoscopy;  Laterality: N/A;   ESOPHAGOGASTRODUODENOSCOPY (EGD) WITH PROPOFOL N/A 10/13/2015   Procedure: ESOPHAGOGASTRODUODENOSCOPY (EGD) WITH PROPOFOL;  Surgeon: Jerene Bears, MD;  [facility];  Service: Gastroenterology;  Laterality: N/A;   ESOPHAGOGASTRODUODENOSCOPY (EGD) WITH PROPOFOL N/A 06/13/2020   Procedure: ESOPHAGOGASTRODUODENOSCOPY (EGD) WITH PROPOFOL;  Surgeon: Harvel Quale, MD;  [facility];  Service: Gastroenterology;  Laterality: N/A;   ESOPHAGOGASTRODUODENOSCOPY (EGD) WITH PROPOFOL N/A 10/02/2021   Procedure: ESOPHAGOGASTRODUODENOSCOPY (EGD) WITH PROPOFOL;  Surgeon: Daryel November, MD;  [facility];  Service: Gastroenterology;  Laterality: N/A;   ESOPHAGOGASTRODUODENOSCOPY (EGD) WITH PROPOFOL N/A 12/03/2021   Procedure: ESOPHAGOGASTRODUODENOSCOPY (EGD) WITH PROPOFOL;  Surgeon: Ladene Artist, MD;  [facility];  Service: Gastroenterology;  Laterality: N/A;   FOOT SURGERY  1994 left, 2002 right foot   bilateral foot reconstruciton   FOOT SURGERY      reconstruction of both feet- no retained hardware.   HERNIA REPAIR     HIATAL HERNIA REPAIR  01-04-2008   HOT HEMOSTASIS N/A 10/02/2021   Procedure: HOT HEMOSTASIS (ARGON PLASMA COAGULATION/BICAP);  Surgeon: Daryel November, MD;  [facility];  Service: Gastroenterology;  Laterality: N/A;   JOINT REPLACEMENT     MELANOMA EXCISION  2019   POLYPECTOMY     POLYPECTOMY  06/13/2020   Procedure: POLYPECTOMY;  Surgeon: Harvel Quale, MD;  [facility];  Service: Gastroenterology;;  ascending,    POLYPECTOMY  12/04/2021   Procedure: POLYPECTOMY;  Surgeon: Juanita Craver, MD;  [facility];  Service: Gastroenterology;;   PROSTATE SURGERY     x 2   SAVORY DILATION N/A 03/25/2016   Procedure: SAVORY DILATION;  Surgeon: Irene Shipper, MD;  [facility];  Service: Endoscopy;  Laterality: N/A;   TOTAL HIP ARTHROPLASTY  07/21/2011   Procedure: TOTAL HIP ARTHROPLASTY ANTERIOR APPROACH;  Surgeon: Mauri Pole, MD;  [facility];  Service: Orthopedics;  Laterality: Left;   TOTAL HIP ARTHROPLASTY Right 09/07/2012   Procedure: RIGHT TOTAL HIP ARTHROPLASTY ANTERIOR APPROACH;  Surgeon: Mcarthur Rossetti, MD;  [facility];  Service: Orthopedics;  Laterality: Right;   TRANSURETHRAL RESECTION OF BLADDER NECK N/A 11/04/2015   Procedure: RESECTION OF BLADDER NECK;  Surgeon: Cleon Gustin, MD;  [facility];  Service: Urology;  Laterality: N/A;   TRANSURETHRAL RESECTION OF PROSTATE N/A 11/04/2015   Procedure: TRANSURETHRAL RESECTION OF THE PROSTATE (TURP); REMOVAL OF UROLIFT IMPLANTS X THREE;  Surgeon: Cleon Gustin, MD;  [facility];  Service: Urology;  Laterality: N/A;   UPPER GASTROINTESTINAL ENDOSCOPY  12/2013   Dr Hilarie Fredrickson, gastritis   VIDEO BRONCHOSCOPY Bilateral 02/01/2017   Procedure: VIDEO BRONCHOSCOPY WITHOUT FLUORO;  Surgeon: Tanda Rockers, MD;  [facility];  Service: Cardiopulmonary;  Laterality: Bilateral;      reports that he has never smoked. He has never used smokeless tobacco. He reports that he does not drink alcohol and does not use drugs.  Allergies  Allergen Reactions   Gadavist [Gadobutrol] Hives   Prednisone     Agitation and hallucinations   Betadine [Povidone Iodine] Other (See Comments)    Blisters    Lortab [Hydrocodone-Acetaminophen] Nausea And Vomiting   Penicillins Other (See Comments)    Lightheadedness  Has patient had a PCN reaction causing immediate rash, facial/tongue/throat swelling, SOB or lightheadedness with hypotension: Yes Has patient had a PCN reaction causing severe rash involving mucus membranes or skin necrosis: No Has patient had a PCN reaction that required hospitalization No Has patient had a PCN reaction occurring within the last 10 years: No If all of the above answers are "NO", then may proceed with Cephalosporin use.   Zetia [Ezetimibe] Other (See Comments)    Muscle weakness    Zocor [Simvastatin] Nausea Only and Other (See Comments)    Muscle weakness     Family History  Problem Relation Age of Onset   Heart disease Sister    Heart failure Father    Other Mother        phebitis related to Erwin's birth, he was 71 weeks old when she died   Colon cancer Neg Hx    Esophageal cancer Neg Hx    Rectal cancer Neg Hx    Stomach cancer Neg Hx    Colon polyps Neg Hx     Prior to Admission medications   Medication Sig Start Date End Date Taking? Authorizing Provider  budesonide-formoterol (SYMBICORT) 160-4.5 MCG/ACT inhaler Inhale 2 puffs into the lungs in the morning and at bedtime. 03/05/21  Yes Margaretha Seeds, MD  cabergoline (DOSTINEX) 0.5 MG tablet Take 1 tablet (0.5 mg total) by mouth 2 (two) times a week. 01/13/22  Yes Cassandria Anger, MD  cholecalciferol (VITAMIN D) 1000 UNITS tablet Take 1,000 Units by mouth daily.    Yes [provider]  dextromethorphan 15 MG/5ML syrup Take 10 mLs (30 mg total) by mouth 4 (four) times daily as  needed for cough. 02/08/22  Yes Patrecia Pour, MD  diclofenac Sodium (VOLTAREN) 1 % GEL Apply 2 g topically 4 (four) times daily. 07/14/21  Yes Dettinger, [name], MD  diphenhydrAMINE (BENADRYL) 25 MG tablet Take 25 mg by mouth every 6 (six) hours as needed for itching.   Yes [provider]  diphenhydramine-acetaminophen (TYLENOL PM) 25-500 MG TABS tablet Take 1/2 tablet by mouth at  bedtime as needed for sleep   Yes [provider]  FEROSUL 325 (65 Fe) MG tablet TAKE (1) TABLET DAILY WITH BREAKFAST. Patient taking differently: Take 325 mg by mouth daily with breakfast. 02/10/21  Yes Dettinger, [name], MD  GAVILAX 17 GM/SCOOP powder DISSOLVE 17 GM SCOOP IN 8 OZ. OF WATER AND DRINK ONCE DAILY AS NEEDED FOR CONSTIPATION Patient taking differently: Take 1 Container by mouth daily as needed for mild constipation or moderate constipation. 06/29/20  Yes Pyrtle, Lajuan Lines, MD  guaiFENesin (MUCINEX) 600 MG 12 hr tablet Take 1 tablet (600 mg total) by mouth 2 (two) times daily. 10/05/21  Yes Bonnielee Haff, MD  levalbuterol [facility] HFA) 45 MCG/ACT inhaler Inhale 1 puff into the lungs every 4 (four) hours as needed for wheezing. 12/06/21 12/06/22 Yes Dahal, Marlowe Aschoff, MD  levothyroxine (SYNTHROID) 75 MCG tablet Take 1 tablet (75 mcg total) by mouth daily before breakfast. 12/01/21  Yes Dettinger, [name], MD  magnesium oxide (MAG-OX) 400 (241.3 Mg) MG tablet Take 1 tablet (400 mg total) by mouth 2 (two) times daily. Patient taking differently: Take 400 mg by mouth 2 (two) times daily. Pt takes once daily 06/13/20  Yes Cristal Deer, MD  Melatonin 5 MG CAPS Take 1 capsule by mouth at bedtime.   Yes [provider]  metoprolol succinate (TOPROL XL) 25 MG 24 hr tablet Take 1 tablet (25 mg total) by mouth daily. 02/08/22  Yes Patrecia Pour, MD  naphazoline-pheniramine (NAPHCON-A) 0.025-0.3 % ophthalmic solution Place 1 drop into the left eye 2 (two) times daily as needed for irritation or  allergies.   Yes [provider]  nitroGLYCERIN (NITROSTAT) 0.4 MG SL tablet Place 1 tablet (0.4 mg total) under the tongue every 5 (five) minutes as needed for chest pain. 07/14/21  Yes Dettinger, [name], MD  pantoprazole (PROTONIX) 40 MG tablet Take 1 tablet (40 mg total) by mouth 2 (two) times daily. 12/06/21 03/06/22 Yes Dahal, Marlowe Aschoff, MD  sodium chloride HYPERTONIC 3 % nebulizer solution Take by nebulization as needed for other. 06/18/20  Yes Margaretha Seeds, MD  tamsulosin (FLOMAX) 0.4 MG CAPS capsule Take 1 capsule (0.4 mg total) by mouth daily after supper. 07/14/21  Yes Dettinger, [name], MD  traMADol (ULTRAM) 50 MG tablet Take 1 tablet (50 mg total) by mouth 2 (two) times daily as needed. Patient taking differently: Take 50 mg by mouth 2 (two) times daily as needed for moderate pain. 09/01/21  Yes Aundra Dubin, PA-C  Turmeric 500 MG CAPS Take 1 capsule by mouth daily.   Yes [provider]  vitamin B-12 (CYANOCOBALAMIN) 1000 MCG tablet Take 1,000 mcg by mouth daily.   Yes [provider]  vitamin E 180 MG (400 UNITS) capsule Take 400 Units by mouth daily.   Yes [provider]  albuterol (PROVENTIL) (2.5 MG/3ML) 0.083% nebulizer solution Take 3 mLs (2.5 mg total) by nebulization every 4 (four) hours as needed for wheezing or shortness of breath. 02/08/22   Patrecia Pour, MD  Ascorbic Acid (VITAMIN C) 500 MG CAPS Take 1 capsule by mouth daily.    [provider]  benzonatate (TESSALON PERLES) 100 MG capsule Take 1 capsule (100 mg total) by mouth 3 (three) times daily for 10 days. 02/08/22 02/18/22  Patrecia Pour, MD  calcium carbonate (OS-CAL - DOSED IN MG OF ELEMENTAL CALCIUM) 1250 (500 Ca) MG tablet Take 1 tablet by mouth daily with breakfast. Patient not taking: Reported on 02/07/2022  [provider]  GNP COUGH DM ER 30 MG/5ML liquid SMARTSIG:1 Teaspoon By Mouth 4 Times Daily PRN Patient not taking: Reported on 02/09/2022 02/08/22    [provider]  psyllium (METAMUCIL) 28 % packet Take 1 packet by mouth daily. 06/07/21   Gwenlyn Perking, FNP  Respiratory Therapy Supplies (FLUTTER) DEVI Twice a day and prn as needed, may increase if feeling worse 05/01/18   Martyn Ehrich, NP  sucralfate (CARAFATE) 1 g tablet Take 1 tablet (1 g total) by mouth 4 (four) times daily -  with meals and at bedtime. 12/06/21 03/06/22  Terrilee Croak, MD    Physical Exam: Vitals:   02/09/22 1407 02/09/22 1717 02/09/22 1935 02/09/22 2115  BP: 107/69 100/73  (!) 107/56  Pulse: 75 74  79  Resp: (!) 24 18  (!) 25  Temp: 98 F (36.7 C) 98.9 F (37.2 C)  98.8 F (37.1 C)  TempSrc: Oral Oral    SpO2: 96% 97% 99% 95%    Constitutional: NAD, calm, comfortable Vitals:   02/09/22 1407 02/09/22 1717 02/09/22 1935 02/09/22 2115  BP: 107/69 100/73  (!) 107/56  Pulse: 75 74  79  Resp: (!) 24 18  (!) 25  Temp: 98 F (36.7 C) 98.9 F (37.2 C)  98.8 F (37.1 C)  TempSrc: Oral Oral    SpO2: 96% 97% 99% 95%   Eyes: PERRL, lids and conjunctivae normal ENMT: Mucous membranes are moist.  Neck: normal, supple, no masses, no thyromegaly Respiratory: clear to auscultation bilaterally, no wheezing, no crackles, normal respiratory effort.  Cardiovascular: Regular rate and rhythm, no murmurs / rubs / gallops. No extremity edema.  Extremities warm..  Abdomen: no tenderness, no masses palpated. No hepatosplenomegaly. Bowel sounds positive.  Musculoskeletal: no clubbing / cyanosis. No joint deformity upper and lower extremities.   Skin: no rashes, lesions, ulcers. No induration Neurologic: Cranial nerve abnormality, +5 strength in all extremities Psychiatric: Normal judgment and insight. Alert and oriented x 3. Normal mood.   Labs on Admission: I have personally reviewed following labs and imaging studies  CBC: Recent Labs  Lab 02/07/22 0454 02/08/22 0037 02/09/22 1449  WBC 15.8* 10.4 9.2  NEUTROABS  --   --  5.4  HGB 10.1* 9.6* 11.1*   HCT 31.7* 30.1* 35.9*  MCV 90.1 89.6 89.5  PLT 220 188 462   Basic Metabolic Panel: Recent Labs  Lab 02/07/22 0454 02/07/22 1313 02/08/22 0037 02/09/22 1449  NA 135  --  132* 130*  K 3.8  --  3.9 3.4*  CL 106  --  106 101  CO2 19*  --  18* 21*  GLUCOSE 123*  --  98 130*  BUN 13  --  16 19  CREATININE 1.17  --  1.18 1.15  CALCIUM 8.3*  --  7.9* 8.1*  MG  --  1.7  --  2.0   Coagulation Profile: Recent Labs  Lab 02/08/22 0037  INR 1.3*   Thyroid Function Tests: Recent Labs    02/07/22 1313  TSH 1.676   Anemia Panel: No results for input(s): "VITAMINB12", "FOLATE", "FERRITIN", "TIBC", "IRON", "RETICCTPCT" in the last 72 hours. Urine analysis:    Component Value Date/Time   COLORURINE YELLOW 12/03/2021 2212   APPEARANCEUR CLEAR 12/03/2021 2212   APPEARANCEUR Clear 09/20/2017 1337   LABSPEC 1.014 12/03/2021 2212   PHURINE 6.0 12/03/2021 [place] 12/03/2021 2212   HGBUR NEGATIVE 12/03/2021 2212   [place] NEGATIVE 12/03/2021 2212  BILIRUBINUR Negative 09/20/2017 [place] 12/03/2021 2212   PROTEINUR NEGATIVE 12/03/2021 2212   UROBILINOGEN negative 12/31/2013 1022   UROBILINOGEN 0.2 04/18/2013 2026   NITRITE NEGATIVE 12/03/2021 2212   LEUKOCYTESUR NEGATIVE 12/03/2021 2212    Radiological Exams on Admission: CT Head Wo Contrast  Result Date: 02/09/2022 CLINICAL DATA:  Altered mental status EXAM: CT HEAD WITHOUT CONTRAST TECHNIQUE: Contiguous axial images were obtained from the base of the skull through the vertex without intravenous contrast. RADIATION DOSE REDUCTION: This exam was performed according to the departmental dose-optimization program which includes automated exposure control, adjustment of the mA and/or kV according to patient size and/or use of iterative reconstruction technique. COMPARISON:  None Available. FINDINGS: Brain: No evidence of acute infarction, hemorrhage, extra-axial collection or mass lesion/mass  effect. Periventricular white matter small vessel ischemic changes noted. Ventricular prominence noted which is out of proportion to the extent of atrophy. This could be due to normal pressure hydrocephalus. Left frontal encephalomalacia versus dilated perivascular space noted. Vascular: No hyperdense vessel or unexpected calcification. Skull: Normal. Negative for fracture or focal lesion. Sinuses/Orbits: Mucoperiosteal thickening consistent with chronic maxillary and ethmoid sinusitis. Other: None. IMPRESSION: 1. Periventricular white-matter small-vessel ischemic changes. 2. Age-related involutional changes. 3. ventricular prominence appears out of proportion to the extent of atrophy which can be indicative of normal pressure hydrocephalus. Electronically Signed   By: Sammie Bench M.D.   On: 02/09/2022 19:45   DG Chest 2 View  Result Date: 02/09/2022 CLINICAL DATA:  Shortness of breath.  Recent diagnosis of RSV. EXAM: CHEST - 2 VIEW COMPARISON:  AP chest 02/07/2022 and 12/02/2021; CT chest 09/30/2021 FINDINGS: Cardiac silhouette and mediastinal contours are within normal limits. Moderate calcification within the aortic arch. Chronic bilateral interstitial thickening, greatest within the lung bases are similar to 02/07/2022 and again mildly increased from 12/02/2021. This may represent an acute process superimposed on chronic scarring. No large pleural effusion is seen. No pneumothorax. Mild dextrocurvature of the midthoracic spine. Moderate multilevel degenerative disc changes. Chronic moderate to high-grade anterior T8 vertebral body height loss. IMPRESSION: Bilateral basilar linear densities are thickened compared to 12/02/2021, unchanged from recent 02/07/2022 radiographs. Again this may represent an acute process such as increased atelectasis versus pneumonia superimposed on chronic scarring. Electronically Signed   By: Yvonne Kendall M.D.   On: 02/09/2022 14:49    EKG: Independently reviewed.  Sinus  rhythm, rate 76.  QTc 479.  No significant change from prior.  Assessment/Plan Principal Problem:   RSV (respiratory syncytial virus infection) Active Problems:   Afib (HCC)   Diastolic CHF (HCC)   COPD (chronic obstructive pulmonary disease) (HCC)  Assessment and Plan: * RSV (respiratory syncytial virus infection) Positive for RSV 12/11.  Persistent cough, dyspnea has improved.  No wheezing or rhonchi on lung exam.  X-ray with stable findings compared to 2 days ago-bilateral bibasilar linear densities, increasing atelectasis versus pneumonia on chronic scarring.  Patient still weak from infection.  Unable to care for himself.  Son reports confusion.  But the patient is able to answer questions appropriately.  Sats greater than 95% on nasal cannula 2 L. -PT eval -Ran out of home as needed O2 his presentation back to the ED  Afib (HCC) PAF. Currently in sinus rhythm.  Hospitalized 12/11 to 12/12 for atrial fibrillation provoked by acute viral infection RSV.  Rate was controlled with metoprolol.  Evaluated by cardiology during hospitalization, not on anticoagulation with history of GI bleed.  No plan for watchman.  Troponin 32 > 28. -Resume metoprolol 25 daily  Diastolic CHF (HCC) Stable and compensated.  Last echo 01/2022, EF of 60 to 65%, no LVH.   DVT prophylaxis: Lovenox Code Status: DNR-confirmed with patient at bedside, he has a living will stating same, status consistent with prior documentation in chart. Family Communication: None at bedside Disposition Plan:  ~ 2 days Consults called: None Admission status:  Obs tele    Author: Bethena Roys, MD 02/09/2022 9:54 PM  For on call review [email or web address].

## 2022-02-09 NOTE — ED Notes (Signed)
Pt unable to give a urine sample at this time. Placed a urinal at bedside.

## 2022-02-09 NOTE — Progress Notes (Signed)
Patient arrived to the floor via wheelchair. He is pleasant. He is able to ambulate in room with assistance. He admits that he can be forgetful at times but is alert and oriented times 4 during assessment.

## 2022-02-09 NOTE — Assessment & Plan Note (Addendum)
PAF. Currently in sinus rhythm.  Hospitalized 12/11 to 12/12 for atrial fibrillation provoked by acute viral infection RSV.  Rate was controlled with metoprolol.  Evaluated by cardiology during hospitalization, not on anticoagulation with history of GI bleed.  No plan for watchman.  Troponin 32 > 28. -Resume metoprolol 25 daily

## 2022-02-09 NOTE — Telephone Encounter (Signed)
Transition Care Management Follow-up Telephone Call Date of discharge and from where: 02/08/2022 [name]  How have you been since you were released from the hospital? Better  Any questions or concerns?  No  Items Reviewed: Did the pt receive and understand the discharge instructions provided? Yes  Medications obtained and verified? Yes  Other? No  Any new allergies since your discharge? No  Dietary orders reviewed? Yes Do you have support at home? Yes   Home Care and Equipment/Supplies: Were home health services ordered? no If so, what is the name of the agency? N/a  Has the agency set up a time to come to the patient's home? not applicable Were any new equipment or medical supplies ordered?  No What is the name of the medical supply agency? N/a Were you able to get the supplies/equipment? not applicable Do you have any questions related to the use of the equipment or supplies? No  Functional Questionnaire: (I = Independent and D = Dependent) ADLs: I  Bathing/Dressing- i  Meal Prep- i  Eating- i  Maintaining continence- i  Transferring/Ambulation- i  Managing Meds- i  Follow up appointments reviewed:  PCP Hospital f/u appt confirmed? Yes  Scheduled to see Dr Dettinger on 02/16/2022 @ [facility] f/u appt confirmed? Yes  Scheduled to see Darla Lesches  on 02/10/2022 @ 835am . Are transportation arrangements needed? No  If their condition worsens, is the pt aware to call PCP or go to the Emergency Dept.? Yes Was the patient provided with contact information for the PCP's office or ED? Yes Was to pt encouraged to call back with questions or concerns? Yes

## 2022-02-09 NOTE — Telephone Encounter (Signed)
Patient needs new order for oxygen, asked for it to be sent to [facility] in [place]

## 2022-02-09 NOTE — ED Provider Notes (Signed)
[facility] EMERGENCY DEPARTMENT Provider Note   CSN: [id number] Arrival date & time: 02/09/22  1336     History  Chief Complaint  Patient presents with   Shortness of Breath    [name] is a 88 y.o. male. With pmh  CAD, COPD, A fib, thoracic aortic aneurysm, discharged yesterday for A-fib with RVR secondary to RSV pneumonitis and discontinuation of metoprolol who presents with continued hypoxia and shortness of breath on exertion.  He is here with his son who noted that patient left too early and they did not want him to discharge yesterday however patient was motivated to leave.  They thought he would be fine to go home because he had oxygen tanks at home however when they tried using the oxygen tanks, he ran out and had no leftover oxygen.  He is not dyspneic at rest but just mildly dyspneic on exertion.  Denies any increased weight gain or orthopnea or PND.  He has had continued mild low-grade fevers and nonproductive cough.  He has had intermittent wheezing.  No new falls, denying any chest pain, denying abdominal pain, vomiting or diarrhea.  Son notes increasing confusion and inability to take care of himself at home right now with his infection and decreased ability to perform ADLs with infection.  He is denying being on any steroids or antibiotics.   Shortness of Breath      Home Medications Prior to Admission medications   Medication Sig Start Date End Date Taking? Authorizing Provider  budesonide-formoterol (SYMBICORT) 160-4.5 MCG/ACT inhaler Inhale 2 puffs into the lungs in the morning and at bedtime. 03/05/21  Yes Margaretha Seeds, MD  cabergoline (DOSTINEX) 0.5 MG tablet Take 1 tablet (0.5 mg total) by mouth 2 (two) times a week. 01/13/22  Yes Cassandria Anger, MD  cholecalciferol (VITAMIN D) 1000 UNITS tablet Take 1,000 Units by mouth daily.    Yes [provider]  dextromethorphan 15 MG/5ML syrup Take 10 mLs (30 mg total) by mouth 4 (four) times daily  as needed for cough. 02/08/22  Yes Patrecia Pour, MD  diclofenac Sodium (VOLTAREN) 1 % GEL Apply 2 g topically 4 (four) times daily. 07/14/21  Yes Dettinger, [name], MD  diphenhydrAMINE (BENADRYL) 25 MG tablet Take 25 mg by mouth every 6 (six) hours as needed for itching.   Yes [provider]  diphenhydramine-acetaminophen (TYLENOL PM) 25-500 MG TABS tablet Take 1/2 tablet by mouth at bedtime as needed for sleep   Yes [provider]  FEROSUL 325 (65 Fe) MG tablet TAKE (1) TABLET DAILY WITH BREAKFAST. Patient taking differently: Take 325 mg by mouth daily with breakfast. 02/10/21  Yes Dettinger, [name], MD  GAVILAX 17 GM/SCOOP powder DISSOLVE 17 GM SCOOP IN 8 OZ. OF WATER AND DRINK ONCE DAILY AS NEEDED FOR CONSTIPATION Patient taking differently: Take 1 Container by mouth daily as needed for mild constipation or moderate constipation. 06/29/20  Yes Pyrtle, Lajuan Lines, MD  guaiFENesin (MUCINEX) 600 MG 12 hr tablet Take 1 tablet (600 mg total) by mouth 2 (two) times daily. 10/05/21  Yes Bonnielee Haff, MD  levalbuterol [facility] HFA) 45 MCG/ACT inhaler Inhale 1 puff into the lungs every 4 (four) hours as needed for wheezing. 12/06/21 12/06/22 Yes Dahal, Marlowe Aschoff, MD  levothyroxine (SYNTHROID) 75 MCG tablet Take 1 tablet (75 mcg total) by mouth daily before breakfast. 12/01/21  Yes Dettinger, [name], MD  magnesium oxide (MAG-OX) 400 (241.3 Mg) MG tablet Take 1 tablet (400 mg  total) by mouth 2 (two) times daily. Patient taking differently: Take 400 mg by mouth 2 (two) times daily. Pt takes once daily 06/13/20  Yes Cristal Deer, MD  Melatonin 5 MG CAPS Take 1 capsule by mouth at bedtime.   Yes [provider]  metoprolol succinate (TOPROL XL) 25 MG 24 hr tablet Take 1 tablet (25 mg total) by mouth daily. 02/08/22  Yes Patrecia Pour, MD  naphazoline-pheniramine (NAPHCON-A) 0.025-0.3 % ophthalmic solution Place 1 drop into the left eye 2 (two) times daily as needed for irritation or  allergies.   Yes [provider]  nitroGLYCERIN (NITROSTAT) 0.4 MG SL tablet Place 1 tablet (0.4 mg total) under the tongue every 5 (five) minutes as needed for chest pain. 07/14/21  Yes Dettinger, [name], MD  pantoprazole (PROTONIX) 40 MG tablet Take 1 tablet (40 mg total) by mouth 2 (two) times daily. 12/06/21 03/06/22 Yes Dahal, Marlowe Aschoff, MD  sodium chloride HYPERTONIC 3 % nebulizer solution Take by nebulization as needed for other. 06/18/20  Yes Margaretha Seeds, MD  tamsulosin (FLOMAX) 0.4 MG CAPS capsule Take 1 capsule (0.4 mg total) by mouth daily after supper. 07/14/21  Yes Dettinger, [name], MD  traMADol (ULTRAM) 50 MG tablet Take 1 tablet (50 mg total) by mouth 2 (two) times daily as needed. Patient taking differently: Take 50 mg by mouth 2 (two) times daily as needed for moderate pain. 09/01/21  Yes Aundra Dubin, PA-C  Turmeric 500 MG CAPS Take 1 capsule by mouth daily.   Yes [provider]  vitamin B-12 (CYANOCOBALAMIN) 1000 MCG tablet Take 1,000 mcg by mouth daily.   Yes [provider]  vitamin E 180 MG (400 UNITS) capsule Take 400 Units by mouth daily.   Yes [provider]  albuterol (PROVENTIL) (2.5 MG/3ML) 0.083% nebulizer solution Take 3 mLs (2.5 mg total) by nebulization every 4 (four) hours as needed for wheezing or shortness of breath. 02/08/22   Patrecia Pour, MD  Ascorbic Acid (VITAMIN C) 500 MG CAPS Take 1 capsule by mouth daily.    [provider]  benzonatate (TESSALON PERLES) 100 MG capsule Take 1 capsule (100 mg total) by mouth 3 (three) times daily for 10 days. 02/08/22 02/18/22  Patrecia Pour, MD  calcium carbonate (OS-CAL - DOSED IN MG OF ELEMENTAL CALCIUM) 1250 (500 Ca) MG tablet Take 1 tablet by mouth daily with breakfast. Patient not taking: Reported on 02/07/2022    [provider]  GNP COUGH DM ER 30 MG/5ML liquid SMARTSIG:1 Teaspoon By Mouth 4 Times Daily PRN Patient not taking: Reported on 02/09/2022 02/08/22    [provider]  psyllium (METAMUCIL) 28 % packet Take 1 packet by mouth daily. 06/07/21   Gwenlyn Perking, FNP  Respiratory Therapy Supplies (FLUTTER) DEVI Twice a day and prn as needed, may increase if feeling worse 05/01/18   Martyn Ehrich, NP  sucralfate (CARAFATE) 1 g tablet Take 1 tablet (1 g total) by mouth 4 (four) times daily -  with meals and at bedtime. 12/06/21 03/06/22  Terrilee Croak, MD      Allergies    Gadavist [gadobutrol], Prednisone, Betadine [povidone iodine], Lortab [hydrocodone-acetaminophen], Penicillins, Zetia [ezetimibe], and Zocor [simvastatin]    Review of Systems   Review of Systems  Respiratory:  Positive for shortness of breath.     Physical Exam Updated Vital Signs BP 109/67 (BP Location: Left Arm)   Pulse 67   Temp (!) 96.9 F (36.1 C)  Resp 20   Wt 68.9 kg   SpO2 97%   BMI 24.52 kg/m  Physical Exam Constitutional: Alert and pleasant elderly gentleman, no acute distress Eyes: Conjunctivae are normal. ENT      Head: Normocephalic and atraumatic.      Neck: No stridor. Cardiovascular: S1, S2, irregularly irregular rhythm, regular rate, equal palpable radial pulses, warm and dry Respiratory: Faint end expiratory wheeze with active congested cough, satting mid 90s on 2 L nasal cannula Gastrointestinal: Soft and nontender.  Musculoskeletal: Normal range of motion in all extremities. Trace nontender pitting edema Neurologic: Normal speech and language.  Moving all extremities equally.  Sensation grossly intact.  No facial droop.  No gross focal neurologic deficits are appreciated. Skin: Skin is warm, dry and intact. No rash noted. Psychiatric: Mood and affect are normal. Speech and behavior are normal.  ED Results / Procedures / Treatments   Labs (all labs ordered are listed, but only abnormal results are displayed) Labs Reviewed  CBC WITH DIFFERENTIAL/PLATELET - Abnormal; Notable for the following components:      Result Value    RBC 4.01 (*)    Hemoglobin 11.1 (*)    HCT 35.9 (*)    RDW 17.5 (*)    Monocytes Absolute 2.4 (*)    Abs Immature Granulocytes 0.31 (*)    All other components within normal limits  BASIC METABOLIC PANEL - Abnormal; Notable for the following components:   Sodium 130 (*)    Potassium 3.4 (*)    CO2 21 (*)    Glucose, Bld 130 (*)    Calcium 8.1 (*)    All other components within normal limits  TROPONIN I (HIGH SENSITIVITY) - Abnormal; Notable for the following components:   Troponin I (High Sensitivity) 32 (*)    All other components within normal limits  TROPONIN I (HIGH SENSITIVITY) - Abnormal; Notable for the following components:   Troponin I (High Sensitivity) 28 (*)    All other components within normal limits  MAGNESIUM  BASIC METABOLIC PANEL    EKG EKG Interpretation  Date/Time:  Wednesday February 09 2022 20:26:10 EST Ventricular Rate:  76 PR Interval:  145 QRS Duration: 104 QT Interval:  426 QTC Calculation: 479 R Axis:   52 Text Interpretation: Sinus rhythm Abnormal R-wave progression, early transition Borderline prolonged QT interval Confirmed by Georgina Snell [phone]) on 02/09/2022 8:34:28 PM  Radiology CT Head Wo Contrast  Result Date: 02/09/2022 CLINICAL DATA:  Altered mental status EXAM: CT HEAD WITHOUT CONTRAST TECHNIQUE: Contiguous axial images were obtained from the base of the skull through the vertex without intravenous contrast. RADIATION DOSE REDUCTION: This exam was performed according to the departmental dose-optimization program which includes automated exposure control, adjustment of the mA and/or kV according to patient size and/or use of iterative reconstruction technique. COMPARISON:  None Available. FINDINGS: Brain: No evidence of acute infarction, hemorrhage, extra-axial collection or mass lesion/mass effect. Periventricular white matter small vessel ischemic changes noted. Ventricular prominence noted which is out of proportion to the extent  of atrophy. This could be due to normal pressure hydrocephalus. Left frontal encephalomalacia versus dilated perivascular space noted. Vascular: No hyperdense vessel or unexpected calcification. Skull: Normal. Negative for fracture or focal lesion. Sinuses/Orbits: Mucoperiosteal thickening consistent with chronic maxillary and ethmoid sinusitis. Other: None. IMPRESSION: 1. Periventricular white-matter small-vessel ischemic changes. 2. Age-related involutional changes. 3. ventricular prominence appears out of proportion to the extent of atrophy which can be indicative of normal pressure hydrocephalus. Electronically Signed  By: Sammie Bench M.D.   On: 02/09/2022 19:45   DG Chest 2 View  Result Date: 02/09/2022 CLINICAL DATA:  Shortness of breath.  Recent diagnosis of RSV. EXAM: CHEST - 2 VIEW COMPARISON:  AP chest 02/07/2022 and 12/02/2021; CT chest 09/30/2021 FINDINGS: Cardiac silhouette and mediastinal contours are within normal limits. Moderate calcification within the aortic arch. Chronic bilateral interstitial thickening, greatest within the lung bases are similar to 02/07/2022 and again mildly increased from 12/02/2021. This may represent an acute process superimposed on chronic scarring. No large pleural effusion is seen. No pneumothorax. Mild dextrocurvature of the midthoracic spine. Moderate multilevel degenerative disc changes. Chronic moderate to high-grade anterior T8 vertebral body height loss. IMPRESSION: Bilateral basilar linear densities are thickened compared to 12/02/2021, unchanged from recent 02/07/2022 radiographs. Again this may represent an acute process such as increased atelectasis versus pneumonia superimposed on chronic scarring. Electronically Signed   By: Yvonne Kendall M.D.   On: 02/09/2022 14:49    Procedures Procedures    Medications Ordered in ED Medications  mometasone-formoterol (DULERA) 200-5 MCG/ACT inhaler 2 puff (2 puffs Inhalation Not Given 02/09/22 2312)   ipratropium-albuterol (DUONEB) 0.5-2.5 (3) MG/3ML nebulizer solution 3 mL (has no administration in time range)  ipratropium-albuterol (DUONEB) 0.5-2.5 (3) MG/3ML nebulizer solution 3 mL (has no administration in time range)  levothyroxine (SYNTHROID) tablet 75 mcg (has no administration in time range)  metoprolol succinate (TOPROL-XL) 24 hr tablet 25 mg (has no administration in time range)  pantoprazole (PROTONIX) EC tablet 40 mg (40 mg Oral Given 02/09/22 2301)  enoxaparin (LOVENOX) injection 40 mg (40 mg Subcutaneous Given 02/09/22 2301)  acetaminophen (TYLENOL) tablet 650 mg (has no administration in time range)    Or  acetaminophen (TYLENOL) suppository 650 mg (has no administration in time range)  promethazine (PHENERGAN) tablet 12.5 mg (has no administration in time range)  polyethylene glycol (MIRALAX / GLYCOLAX) packet 17 g (has no administration in time range)  guaiFENesin-dextromethorphan (ROBITUSSIN DM) 100-10 MG/5ML syrup 5 mL (5 mLs Oral Given 02/10/22 0137)  sodium chloride 0.9 % bolus 1,000 mL (0 mLs Intravenous Stopped 02/09/22 2107)  potassium chloride SA (KLOR-CON M) CR tablet 20 mEq (20 mEq Oral Given 02/09/22 1908)  ipratropium-albuterol (DUONEB) 0.5-2.5 (3) MG/3ML nebulizer solution 3 mL (3 mLs Nebulization Given 02/09/22 1934)  potassium chloride SA (KLOR-CON M) CR tablet 20 mEq (20 mEq Oral Given 02/09/22 2301)    ED Course/ Medical Decision Making/ A&P Clinical Course as of 02/10/22 0247  Wed Feb 09, 2022  2055 Discussed case with on-call hospitalist will be down to evaluate the patient and put in orders for admission. [VB]    Clinical Course User Index [VB] Elgie Congo, MD                           Medical Decision Making  [name] is a 88 y.o. male. With pmh  CAD, COPD, A fib, thoracic aortic aneurysm, discharged yesterday for A-fib with RVR secondary to RSV pneumonitis and discontinuation of metoprolol who presents with continued hypoxia and  shortness of breath on exertion.    Initial EKG when presenting A-fib RVR with nonspecific ST changes however denying any chest pain, unlikely atypical ACS.  Improved with IV fluids.  High sensitive troponin 32 however down trended since prior while in the hospital from 139.  Likely demand ischemia in the setting of episodes of intermittent A-fib RVR.  Patient wanted to early discharge yesterday  after being admitted for A-fib RVR in the setting of RSV pneumonitis.  He was not on steroids or antibiotics.  He presents still requiring 2 L nasal cannula and ran out of oxygen at home.  His chest x-ray shows bilateral basilar densities unchanged from previous chest x-ray which I personally reviewed, with known RSV infection and no leukocytosis white blood cell count 9.2 holding off from antibiotics.  He is mildly hyponatremic 130 and mildly hypokalemic 3.4 which I suspect is due to decreased p.o. intake, ordered for gentle IV fluids as he does not appear fluid overloaded on exam.  He has had some mild confusion and inability to complete all ADLs while living home alone.  CT head obtained which showed no evidence of ICH however some ventricular prominence suggestive of NPH however he does not have symptoms that are consistent.  Admitted patient to hospitalist service for continued management of RSV infection and mild encephalopathy in the setting of RSV infection and hypoxia.  Amount and/or Complexity of Data Reviewed Labs: ordered. Radiology: ordered.  Risk Prescription drug management. Decision regarding hospitalization.    Final Clinical Impression(s) / ED Diagnoses Final diagnoses:  RSV (acute bronchiolitis due to respiratory syncytial virus)  Confusion  Hyponatremia  Hypokalemia  Atrial fibrillation with RVR ([place])    Rx / DC Orders ED Discharge Orders     None         Elgie Congo, MD 02/10/22 [phone]

## 2022-02-09 NOTE — Assessment & Plan Note (Signed)
Positive for RSV 12/11.  Persistent cough, dyspnea has improved.  No wheezing or rhonchi on lung exam.  X-ray with stable findings compared to 2 days ago-bilateral bibasilar linear densities, increasing atelectasis versus pneumonia on chronic scarring.  Patient still weak from infection.  Unable to care for himself.  Son reports confusion.  But the patient is able to answer questions appropriately.  Sats greater than 95% on nasal cannula 2 L. -PT eval -Ran out of home as needed O2 his presentation back to the ED

## 2022-02-09 NOTE — ED Notes (Signed)
Per ems, pt was called for sob. Fire gave him 2L so upon ems arrival pt was wnl and not sob. Pt was dx with RSV a few weeks ago. Pt in nad. No resp distress at this time on 2L [place]

## 2022-02-09 NOTE — Telephone Encounter (Signed)
Nurse called pt today for a MCR AWV and spoke with his caregiver, he just got out of the hospital and she had called the office this morning to get an order for oxygen for him. He has some old at home, one tank won't work, and has two empty tanks.  Pt is currently at the ED, will call tomorrow to see who the old oxygen tanks are with to see if we can not continue w/ that company first. Do not see any documentation under media as to who the company maybe.

## 2022-02-09 NOTE — Assessment & Plan Note (Signed)
Stable and compensated.  Last echo 01/2022, EF of 60 to 65%, no LVH.

## 2022-02-10 ENCOUNTER — Ambulatory Visit: Payer: Medicare Other

## 2022-02-10 DIAGNOSIS — B338 Other specified viral diseases: Secondary | ICD-10-CM | POA: Diagnosis not present

## 2022-02-10 LAB — BASIC METABOLIC PANEL
Anion gap: 4 — ABNORMAL LOW (ref 5–15)
BUN: 17 mg/dL (ref 8–23)
CO2: 20 mmol/L — ABNORMAL LOW (ref 22–32)
Calcium: 7.6 mg/dL — ABNORMAL LOW (ref 8.9–10.3)
Chloride: 109 mmol/L (ref 98–111)
Creatinine, Ser: 0.92 mg/dL (ref 0.61–1.24)
GFR, Estimated: >60 mL/min (ref >60)
Glucose, Bld: 81 mg/dL (ref 70–99)
Potassium: 3.8 mmol/L (ref 3.5–5.1)
Sodium: 133 mmol/L — ABNORMAL LOW (ref 135–145)

## 2022-02-10 MED ORDER — GUAIFENESIN-DM 100-10 MG/5ML PO SYRP
5.0000 mL | ORAL_SOLUTION | ORAL | Status: DC | PRN
  Administered 2022-02-10: 5 mL via ORAL
  Filled 2022-02-10: qty 5

## 2022-02-10 NOTE — Progress Notes (Signed)
SATURATION QUALIFICATIONS: (This note is used to comply with regulatory documentation for home oxygen)  Patient Saturations on Room Air at Rest = 94%  Patient Saturations on Room Air while Ambulating = 86%  Patient Saturations on 2 Liters of oxygen while Ambulating = 92%  Please briefly explain why patient needs home oxygen:

## 2022-02-10 NOTE — Telephone Encounter (Signed)
LMOVM to return call to see if there was the name of a company on the oxygen tanks they have at the home.

## 2022-02-10 NOTE — Evaluation (Signed)
Physical Therapy Evaluation Patient Details Name: [name] MRN: [id number] DOB: 01-29-1933 Today's Date: 02/10/2022  History of Present Illness  [name] is a 88 y.o. male with medical history significant for diastolic CHF, COPD, coronary artery disease, hypothyroidism.     Patient was hospitalized 12/11 -12/12 at [facility] for RSV pneumonitis provoking atrial fibrillation with RVR.  Patient also had chest pain which was thought secondary to demand ischemia from A-fib.  Rate improved with metoprolol.  He also had acute exacerbation of his COPD with inhaled steroid.  Patient felt better yesterday and wanted to be discharged home.  O2 sats were greater than 90% on room air throughout.  Reports he was to go home and continue with his O2 as needed.     Came back to the ED today because on getting home his oxygen tanks were empty.  He reports his breathing has improved, but he still having persistent productive cough.  He has no new complaints today.  No Chest pains.  No leg swelling.  Son also reported that patient had increasing confusion and unable to take care of himself, or perform ADLs.  Patient has an aide from a private agency that comes for a few hours 3 days a week.   Clinical Impression  Patient functioning near baseline for functional mobility and gait demonstrating good return for bed mobility, transfers and ambulating in room using RW without loss of balance, limited only due to c/o mild SOB and requested to be put back on 2 LPM O2.  Plan:  Patient discharged from physical therapy to care of nursing for ambulation daily as tolerated for length of stay.         Recommendations for follow up therapy are one component of a multi-disciplinary discharge planning process, led by the attending physician.  Recommendations may be updated based on patient status, additional functional criteria and insurance authorization.  Follow Up Recommendations No PT follow up       Assistance Recommended at Discharge PRN  Patient can return home with the following  Assistance with cooking/housework;A little help with bathing/dressing/bathroom;Assist for transportation    Equipment Recommendations None recommended by PT  Recommendations for Other Services       Functional Status Assessment Patient has had a recent decline in their functional status and demonstrates the ability to make significant improvements in function in a reasonable and predictable amount of time.     Precautions / Restrictions Precautions Precautions: Fall      Mobility  Bed Mobility Overal bed mobility: Modified Independent                  Transfers Overall transfer level: Modified independent                      Ambulation/Gait Ambulation/Gait assistance: Modified independent (Device/Increase time) Gait Distance (Feet): 75 Feet Assistive device: Rolling walker (2 wheels) Gait Pattern/deviations: Decreased step length - right, Decreased step length - left, Decreased stride length Gait velocity: decreased     General Gait Details: demonstrates good return for ambulating in room using RW without loss of balance, on room air with SpO2 at 92%  Stairs            Wheelchair Mobility    Modified Rankin (Stroke Patients Only)       Balance Overall balance assessment: Needs assistance Sitting-balance support: Feet supported, No upper extremity supported Sitting balance-Leahy Scale: Good Sitting balance - Comments: seated at  EOB   Standing balance support: During functional activity, Bilateral upper extremity supported Standing balance-Leahy Scale: Good Standing balance comment: using RW                             Pertinent Vitals/Pain Pain Assessment Pain Assessment: No/denies pain    Home Living Family/patient expects to be discharged to:: Private residence Living Arrangements: Alone Available Help at Discharge: Family;Available  PRN/intermittently;Neighbor Type of Home: House Home Access: Stairs to enter;Ramped entrance Entrance Stairs-Rails: Right;Left Entrance Stairs-Number of Steps: 4 Alternate Level Stairs-Number of Steps: basement Home Layout: One level Home Equipment: Cane - single point;Rollator (4 wheels);Rolling Walker (2 wheels);Grab bars - tub/shower      Prior Function Prior Level of Function : Independent/Modified Independent             Mobility Comments: uses a cane for community and RW for household, no fall since July, does workout routine every morning with exercises- squats/pushups and walks .25 miles as well. ADLs Comments: independent with ADLs, drives, does household chores     Hand Dominance   Dominant Hand: Right    Extremity/Trunk Assessment   Upper Extremity Assessment Upper Extremity Assessment: Overall WFL for tasks assessed    Lower Extremity Assessment Lower Extremity Assessment: Overall WFL for tasks assessed    Cervical / Trunk Assessment Cervical / Trunk Assessment: Kyphotic  Communication   Communication: No difficulties  Cognition Arousal/Alertness: Awake/alert Behavior During Therapy: WFL for tasks assessed/performed Overall Cognitive Status: Within Functional Limits for tasks assessed                                          General Comments      Exercises     Assessment/Plan    PT Assessment Patient does not need any further PT services  PT Problem List         PT Treatment Interventions      PT Goals (Current goals can be found in the Care Plan section)  Acute Rehab PT Goals Patient Stated Goal: return home PT Goal Formulation: With patient Time For Goal Achievement: 02/10/22 Potential to Achieve Goals: Good    Frequency       Co-evaluation               AM-PAC PT "6 Clicks" Mobility  Outcome Measure Help needed turning from your back to your side while in a flat bed without using bedrails?: None Help  needed moving from lying on your back to sitting on the side of a flat bed without using bedrails?: None Help needed moving to and from a bed to a chair (including a wheelchair)?: None Help needed standing up from a chair using your arms (e.g., wheelchair or bedside chair)?: None Help needed to walk in hospital room?: None Help needed climbing 3-5 steps with a railing? : A Little 6 Click Score: 23    End of Session   Activity Tolerance: Patient tolerated treatment well;Patient limited by fatigue Patient left: in bed;with call bell/phone within reach Nurse Communication: Mobility status PT Visit Diagnosis: Unsteadiness on feet (R26.81);Other abnormalities of gait and mobility (R26.89);Muscle weakness (generalized) (M62.81)    Time: 4098-1191 PT Time Calculation (min) (ACUTE ONLY): 21 min   Charges:   PT Evaluation $PT Eval Moderate Complexity: 1 Mod PT Treatments $Therapeutic Activity: 8-22 mins  12:08 PM, 02/10/22 Lonell Grandchild, MPT Physical Therapist with [facility] [phone] office [phone] mobile phone

## 2022-02-10 NOTE — Progress Notes (Signed)
Patient slept on and off this shift. Patient ambulated to the restroom twice this shift. Request for cough medication, Robitussin given orally. No complaints of pain patient continues on 2L oxygen via Nasal cannula. Continued to monitor.

## 2022-02-10 NOTE — Discharge Summary (Signed)
Physician Discharge Summary  [name] BDZ:[id number] DOB: [birth year] DOA: 02/09/2022  PCP: Dettinger, [name], MD  Admit date: 02/09/2022  Discharge date: 02/10/2022  Admitted From:Home  Disposition:  Home  Recommendations for Outpatient Follow-up:  Follow up with PCP in 1-2 weeks Patient and family given information regarding ALF which can be pursued at a later time Continue other home medications as prior Patient to be set up with home oxygen  Home Health: None  Equipment/Devices: Home 2 L nasal cannula  Discharge Condition:Stable  CODE STATUS: DNR  Diet recommendation: Heart Healthy  Brief/Interim Summary: Per HPI: [name] is a 88 y.o. male with medical history significant for diastolic CHF, COPD, coronary artery disease, hypothyroidism.   Patient was hospitalized 12/11 -12/12 at [facility] for RSV pneumonitis provoking atrial fibrillation with RVR.  Patient also had chest pain which was thought secondary to demand ischemia from A-fib.  Rate improved with metoprolol.  He also had acute exacerbation of his COPD with inhaled steroid.  Patient felt better yesterday and wanted to be discharged home.  O2 sats were greater than 90% on room air throughout.  Reports he was to go home and continue with his O2 as needed.   Came back to the ED today because on getting home his oxygen tanks were empty.  He reports his breathing has improved, but he still having persistent productive cough.  He has no new complaints today.  No Chest pains.  No leg swelling.  Son also reported that patient had increasing confusion and unable to take care of himself, or perform ADLs.  Patient has an aide from a private agency that comes for a few hours 3 days a week.  -Patient has been set up with home oxygen and has been given information regarding assisted living facility by TOC.  PT reevaluation with no further home needs noted.  He is not confused and is in stable condition for  discharge today with home oxygen set up.  No other acute events noted during this brief admission.  Discharge Diagnoses:  Principal Problem:   RSV (respiratory syncytial virus infection) Active Problems:   Afib (HCC)   Diastolic CHF ([place])   COPD (chronic obstructive pulmonary disease) ([place])  Principal discharge diagnosis: Mild hypoxemia ongoing in the setting of recent RSV infection.  Debility/deconditioning.  Discharge Instructions  Discharge Instructions     Diet - low sodium heart healthy   Complete by: As directed    Increase activity slowly   Complete by: As directed       Allergies as of 02/10/2022       Reactions   Gadavist [gadobutrol] Hives   Prednisone    Agitation and hallucinations   Betadine [povidone Iodine] Other (See Comments)   Blisters    Lortab [hydrocodone-acetaminophen] Nausea And Vomiting   Penicillins Other (See Comments)   Lightheadedness  Has patient had a PCN reaction causing immediate rash, facial/tongue/throat swelling, SOB or lightheadedness with hypotension: Yes Has patient had a PCN reaction causing severe rash involving mucus membranes or skin necrosis: No Has patient had a PCN reaction that required hospitalization No Has patient had a PCN reaction occurring within the last 10 years: No If all of the above answers are "NO", then may proceed with Cephalosporin use.   Zetia [ezetimibe] Other (See Comments)   Muscle weakness    Zocor [simvastatin] Nausea Only, Other (See Comments)   Muscle weakness         Medication List  TAKE these medications    albuterol (2.5 MG/3ML) 0.083% nebulizer solution Commonly known as: PROVENTIL Take 3 mLs (2.5 mg total) by nebulization every 4 (four) hours as needed for wheezing or shortness of breath.   benzonatate 100 MG capsule Commonly known as: Tessalon Perles Take 1 capsule (100 mg total) by mouth 3 (three) times daily for 10 days.   budesonide-formoterol 160-4.5 MCG/ACT inhaler Commonly  known as: Symbicort Inhale 2 puffs into the lungs in the morning and at bedtime.   cabergoline 0.5 MG tablet Commonly known as: DOSTINEX Take 1 tablet (0.5 mg total) by mouth 2 (two) times a week.   calcium carbonate 1250 (500 Ca) MG tablet Commonly known as: OS-CAL - dosed in mg of elemental calcium Take 1 tablet by mouth daily with breakfast.   cholecalciferol 1000 units tablet Commonly known as: VITAMIN D Take 1,000 Units by mouth daily.   cyanocobalamin 1000 MCG tablet Commonly known as: VITAMIN B12 Take 1,000 mcg by mouth daily.   dextromethorphan 15 MG/5ML syrup Take 10 mLs (30 mg total) by mouth 4 (four) times daily as needed for cough.   diclofenac Sodium 1 % Gel Commonly known as: VOLTAREN Apply 2 g topically 4 (four) times daily.   diphenhydrAMINE 25 MG tablet Commonly known as: BENADRYL Take 25 mg by mouth every 6 (six) hours as needed for itching.   diphenhydramine-acetaminophen 25-500 MG Tabs tablet Commonly known as: TYLENOL PM Take 1/2 tablet by mouth at bedtime as needed for sleep   FeroSul 325 (65 FE) MG tablet Generic drug: ferrous sulfate TAKE (1) TABLET DAILY WITH BREAKFAST. What changed: See the new instructions.   Flutter Devi Twice a day and prn as needed, may increase if feeling worse   GaviLAX 17 GM/SCOOP powder Generic drug: polyethylene glycol powder DISSOLVE 17 GM SCOOP IN 8 OZ. OF WATER AND DRINK ONCE DAILY AS NEEDED FOR CONSTIPATION What changed: See the new instructions.   GNP Cough DM ER 30 MG/5ML liquid Generic drug: dextromethorphan SMARTSIG:1 Teaspoon By Mouth 4 Times Daily PRN   guaiFENesin 600 MG 12 hr tablet Commonly known as: Mucinex Take 1 tablet (600 mg total) by mouth 2 (two) times daily.   levalbuterol 45 MCG/ACT inhaler Commonly known as: XOPENEX HFA Inhale 1 puff into the lungs every 4 (four) hours as needed for wheezing.   levothyroxine 75 MCG tablet Commonly known as: SYNTHROID Take 1 tablet (75 mcg total) by  mouth daily before breakfast.   magnesium oxide 400 (241.3 Mg) MG tablet Commonly known as: MAG-OX Take 1 tablet (400 mg total) by mouth 2 (two) times daily. What changed: additional instructions   Melatonin 5 MG Caps Take 1 capsule by mouth at bedtime.   Metamucil 28 % packet Generic drug: psyllium Take 1 packet by mouth daily.   metoprolol succinate 25 MG 24 hr tablet Commonly known as: Toprol XL Take 1 tablet (25 mg total) by mouth daily.   naphazoline-pheniramine 0.025-0.3 % ophthalmic solution Commonly known as: NAPHCON-A Place 1 drop into the left eye 2 (two) times daily as needed for irritation or allergies.   nitroGLYCERIN 0.4 MG SL tablet Commonly known as: NITROSTAT Place 1 tablet (0.4 mg total) under the tongue every 5 (five) minutes as needed for chest pain.   pantoprazole 40 MG tablet Commonly known as: PROTONIX Take 1 tablet (40 mg total) by mouth 2 (two) times daily.   sodium chloride HYPERTONIC 3 % nebulizer solution Take by nebulization as needed for other.   sucralfate  1 g tablet Commonly known as: CARAFATE Take 1 tablet (1 g total) by mouth 4 (four) times daily -  with meals and at bedtime.   tamsulosin 0.4 MG Caps capsule Commonly known as: FLOMAX Take 1 capsule (0.4 mg total) by mouth daily after supper.   traMADol 50 MG tablet Commonly known as: ULTRAM Take 1 tablet (50 mg total) by mouth 2 (two) times daily as needed. What changed: reasons to take this   Turmeric 500 MG Caps Take 1 capsule by mouth daily.   Vitamin C 500 MG Caps Take 1 capsule by mouth daily.   vitamin E 180 MG (400 UNITS) capsule Take 400 Units by mouth daily.               Durable Medical Equipment  (From admission, onward)           Start     Ordered   02/10/22 1142  For home use only DME oxygen  Once       Question Answer Comment  Length of Need Lifetime   Mode or (Route) Nasal cannula   Liters per Minute 2   Frequency Continuous (stationary and  portable oxygen unit needed)   Oxygen conserving device Yes   Oxygen delivery system Gas      02/10/22 1141            Follow-up Information     Dettinger, [name], MD. Schedule an appointment as soon as possible for a visit in 1 week(s).   Specialties: Family Medicine, Cardiology Contact information: [place] [address] [phone]                Allergies  Allergen Reactions   Gadavist [Gadobutrol] Hives   Prednisone     Agitation and hallucinations   Betadine [Povidone Iodine] Other (See Comments)    Blisters    Lortab [Hydrocodone-Acetaminophen] Nausea And Vomiting   Penicillins Other (See Comments)    Lightheadedness  Has patient had a PCN reaction causing immediate rash, facial/tongue/throat swelling, SOB or lightheadedness with hypotension: Yes Has patient had a PCN reaction causing severe rash involving mucus membranes or skin necrosis: No Has patient had a PCN reaction that required hospitalization No Has patient had a PCN reaction occurring within the last 10 years: No If all of the above answers are "NO", then may proceed with Cephalosporin use.   Zetia [Ezetimibe] Other (See Comments)    Muscle weakness    Zocor [Simvastatin] Nausea Only and Other (See Comments)    Muscle weakness     Consultations: None   Procedures/Studies: CT Head Wo Contrast  Result Date: 02/09/2022 CLINICAL DATA:  Altered mental status EXAM: CT HEAD WITHOUT CONTRAST TECHNIQUE: Contiguous axial images were obtained from the base of the skull through the vertex without intravenous contrast. RADIATION DOSE REDUCTION: This exam was performed according to the departmental dose-optimization program which includes automated exposure control, adjustment of the mA and/or kV according to patient size and/or use of iterative reconstruction technique. COMPARISON:  None Available. FINDINGS: Brain: No evidence of acute infarction, hemorrhage, extra-axial collection or mass  lesion/mass effect. Periventricular white matter small vessel ischemic changes noted. Ventricular prominence noted which is out of proportion to the extent of atrophy. This could be due to normal pressure hydrocephalus. Left frontal encephalomalacia versus dilated perivascular space noted. Vascular: No hyperdense vessel or unexpected calcification. Skull: Normal. Negative for fracture or focal lesion. Sinuses/Orbits: Mucoperiosteal thickening consistent with chronic maxillary and ethmoid sinusitis. Other:  None. IMPRESSION: 1. Periventricular white-matter small-vessel ischemic changes. 2. Age-related involutional changes. 3. ventricular prominence appears out of proportion to the extent of atrophy which can be indicative of normal pressure hydrocephalus. Electronically Signed   By: Sammie Bench M.D.   On: 02/09/2022 19:45   DG Chest 2 View  Result Date: 02/09/2022 CLINICAL DATA:  Shortness of breath.  Recent diagnosis of RSV. EXAM: CHEST - 2 VIEW COMPARISON:  AP chest 02/07/2022 and 12/02/2021; CT chest 09/30/2021 FINDINGS: Cardiac silhouette and mediastinal contours are within normal limits. Moderate calcification within the aortic arch. Chronic bilateral interstitial thickening, greatest within the lung bases are similar to 02/07/2022 and again mildly increased from 12/02/2021. This may represent an acute process superimposed on chronic scarring. No large pleural effusion is seen. No pneumothorax. Mild dextrocurvature of the midthoracic spine. Moderate multilevel degenerative disc changes. Chronic moderate to high-grade anterior T8 vertebral body height loss. IMPRESSION: Bilateral basilar linear densities are thickened compared to 12/02/2021, unchanged from recent 02/07/2022 radiographs. Again this may represent an acute process such as increased atelectasis versus pneumonia superimposed on chronic scarring. Electronically Signed   By: Yvonne Kendall M.D.   On: 02/09/2022 14:49   ECHOCARDIOGRAM  COMPLETE  Result Date: 02/07/2022    ECHOCARDIOGRAM REPORT   Patient Name:   [name] Date of Exam: 02/07/2022 Medical Rec #:  [id number]       Height:       66.0 in Accession #:    [id number]      Weight:       150.0 lb Date of Birth:  10-16-1932       BSA:          1.770 m Patient Age:    63 years        BP:           102/58 mmHg Patient Gender: M               HR:           126 bpm. Exam Location:  Inpatient Procedure: 2D Echo, Cardiac Doppler and Color Doppler Indications:    R07.9* Chest pain, unspecified  History:        Patient has prior history of Echocardiogram examinations, most                 recent 10/01/2021. CHF, CAD, Abnormal ECG, COPD, Arrythmias:Atrial                 Fibrillation, Signs/Symptoms:Chest Pain; Risk                 Factors:Dyslipidemia. Ascending aortic aneurysm.  Sonographer:    Roseanna Rainbow RDCS Referring Phys: Orma Flaming  Sonographer Comments: Patient coughing throughout exam. IMPRESSIONS  1. Left ventricular ejection fraction, by estimation, is 60 to 65%. The left ventricle has normal function. The left ventricle has no regional wall motion abnormalities. Left ventricular diastolic function could not be evaluated.  2. Right ventricular systolic function is normal. The right ventricular size is normal. There is normal pulmonary artery systolic pressure.  3. Left atrial size was mildly dilated.  4. Right atrial size was mildly dilated.  5. The mitral valve is normal in structure. Mild mitral valve regurgitation. No evidence of mitral stenosis.  6. The aortic valve is tricuspid. There is mild calcification of the aortic valve. Aortic valve regurgitation is trivial. Aortic valve sclerosis is present, with no evidence of aortic valve stenosis.  7. Aortic dilatation noted. There is moderate  dilatation of the ascending aorta, measuring 45 mm.  8. The inferior vena cava is dilated in size with >50% respiratory variability, suggesting right atrial pressure of 8 mmHg. Comparison(s):  Prior images reviewed side by side. Conclusion(s)/Recommendation(s): Moderate dilation of ascending aorta measuring 45 mm. FINDINGS  Left Ventricle: Left ventricular ejection fraction, by estimation, is 60 to 65%. The left ventricle has normal function. The left ventricle has no regional wall motion abnormalities. The left ventricular internal cavity size was normal in size. There is  no left ventricular hypertrophy. Left ventricular diastolic function could not be evaluated due to atrial fibrillation. Left ventricular diastolic function could not be evaluated. Right Ventricle: The right ventricular size is normal. Right vetricular wall thickness was not well visualized. Right ventricular systolic function is normal. There is normal pulmonary artery systolic pressure. The tricuspid regurgitant velocity is 2.57 m/s, and with an assumed right atrial pressure of 8 mmHg, the estimated right ventricular systolic pressure is 67.1 mmHg. Left Atrium: Left atrial size was mildly dilated. Right Atrium: Right atrial size was mildly dilated. Pericardium: There is no evidence of pericardial effusion. Mitral Valve: The mitral valve is normal in structure. Mild mitral valve regurgitation. No evidence of mitral valve stenosis. Tricuspid Valve: The tricuspid valve is normal in structure. Tricuspid valve regurgitation is mild . No evidence of tricuspid stenosis. Aortic Valve: The aortic valve is tricuspid. There is mild calcification of the aortic valve. Aortic valve regurgitation is trivial. Aortic valve sclerosis is present, with no evidence of aortic valve stenosis. Pulmonic Valve: The pulmonic valve was not well visualized. Pulmonic valve regurgitation is not visualized. No evidence of pulmonic stenosis. Aorta: Aortic dilatation noted. There is moderate dilatation of the ascending aorta, measuring 45 mm. Venous: The inferior vena cava is dilated in size with greater than 50% respiratory variability, suggesting right atrial  pressure of 8 mmHg. IAS/Shunts: The atrial septum is grossly normal.  LEFT VENTRICLE PLAX 2D LVIDd:         3.75 cm LVIDs:         2.30 cm LV PW:         0.95 cm LV IVS:        0.90 cm LVOT diam:     2.50 cm LV SV:         70 LV SV Index:   39 LVOT Area:     4.91 cm  LV Volumes (MOD) LV vol d, MOD A2C: 78.0 ml LV vol d, MOD A4C: 64.8 ml LV vol s, MOD A2C: 31.4 ml LV vol s, MOD A4C: 24.0 ml LV SV MOD A2C:     46.6 ml LV SV MOD A4C:     64.8 ml LV SV MOD BP:      44.2 ml RIGHT VENTRICLE            IVC RV S prime:     9.46 cm/s  IVC diam: 2.20 cm TAPSE (M-mode): 2.2 cm LEFT ATRIUM             Index        RIGHT ATRIUM           Index LA diam:        3.50 cm 1.98 cm/m   RA Area:     18.10 cm LA Vol (A2C):   57.1 ml 32.27 ml/m  RA Volume:   50.60 ml  28.59 ml/m LA Vol (A4C):   57.0 ml 32.21 ml/m LA Biplane Vol: 56.9 ml 32.15 ml/m  AORTIC  VALVE LVOT Vmax:   93.60 cm/s LVOT Vmean:  65.400 cm/s LVOT VTI:    0.142 m  AORTA Ao Root diam: 3.70 cm Ao Asc diam:  4.16 cm MITRAL VALVE                  TRICUSPID VALVE MV Area (PHT): 5.44 cm       TR Peak grad:   26.4 mmHg MV Decel Time: 139 msec       TR Vmax:        257.00 cm/s MR Peak grad:    73.6 mmHg MR Mean grad:    52.0 mmHg    SHUNTS MR Vmax:         429.00 cm/s  Systemic VTI:  0.14 m MR Vmean:        348.0 cm/s   Systemic Diam: 2.50 cm MR PISA:         0.57 cm MR PISA Eff ROA: 5 mm MR PISA Radius:  0.30 cm MV E velocity: 96.07 cm/s Buford Dresser MD Electronically signed by Buford Dresser MD Signature Date/Time: 02/07/2022/2:03:02 PM    Final    DG Chest Port 1 View  Result Date: 02/07/2022 CLINICAL DATA:  88 year old male with history of chest pain and cough. EXAM: PORTABLE CHEST 1 VIEW COMPARISON:  Chest x-ray 12/02/2021. FINDINGS: Bibasilar opacities may reflect areas of atelectasis and/or consolidation with superimposed small left pleural effusion. No definite right pleural effusion. No pneumothorax. No definite suspicious appearing  pulmonary nodules or masses are noted. Heart size is normal. Upper mediastinal contours are within normal limits. Atherosclerotic calcifications in the thoracic aorta. IMPRESSION: 1. Bibasilar areas of atelectasis and/or consolidation. 2. Small left pleural effusion. 3. Aortic atherosclerosis. Electronically Signed   By: Vinnie Langton M.D.   On: 02/07/2022 05:01     Discharge Exam: Vitals:   02/10/22 0906 02/10/22 1035  BP:  138/76  Pulse: (!) 106 72  Resp:  (!) 23  Temp:  98.2 F (36.8 C)  SpO2:  97%   Vitals:   02/10/22 0735 02/10/22 0901 02/10/22 0906 02/10/22 1035  BP:  114/78  138/76  Pulse:  (!) 115 (!) 106 72  Resp:    (!) 23  Temp:  98.7 F (37.1 C)  98.2 F (36.8 C)  TempSrc:  Oral  Oral  SpO2: 97% 95%  97%  Weight:        General: Pt is alert, awake, not in acute distress Cardiovascular: RRR, S1/S2 +, no rubs, no gallops Respiratory: CTA bilaterally, no wheezing, no rhonchi, 2 L nasal cannula Abdominal: Soft, NT, ND, bowel sounds + Extremities: no edema, no cyanosis    The results of significant diagnostics from this hospitalization (including imaging, microbiology, ancillary and laboratory) are listed below for reference.     Microbiology: Recent Results (from the past 240 hour(s))  Resp panel by RT-PCR (RSV, Flu A&B, Covid) Anterior Nasal Swab     Status: Abnormal   Collection Time: 02/07/22 12:28 PM   Specimen: Anterior Nasal Swab  Result Value Ref Range Status   SARS Coronavirus 2 by RT PCR NEGATIVE NEGATIVE Final    Comment: (NOTE) SARS-CoV-2 target nucleic acids are NOT DETECTED.  The SARS-CoV-2 RNA is generally detectable in upper respiratory specimens during the acute phase of infection. The lowest concentration of SARS-CoV-2 viral copies this assay can detect is 138 copies/mL. A negative result does not preclude SARS-Cov-2 infection and should not be used as the sole basis for treatment or other  patient management decisions. A negative result  may occur with  improper specimen collection/handling, submission of specimen other than nasopharyngeal swab, presence of viral mutation(s) within the areas targeted by this assay, and inadequate number of viral copies(<138 copies/mL). A negative result must be combined with clinical observations, patient history, and epidemiological information. The expected result is Negative.  Fact Sheet for Patients:  EntrepreneurPulse.com.au  Fact Sheet for Healthcare Providers:  IncredibleEmployment.be  This test is no t yet approved or cleared by the [place] FDA and  has been authorized for detection and/or diagnosis of SARS-CoV-2 by FDA under an Emergency Use Authorization (EUA). This EUA will remain  in effect (meaning this test can be used) for the duration of the COVID-19 declaration under Section 564(b)(1) of the Act, 21 U.S.C.section 360bbb-3(b)(1), unless the authorization is terminated  or revoked sooner.       Influenza A by PCR NEGATIVE NEGATIVE Final   Influenza B by PCR NEGATIVE NEGATIVE Final    Comment: (NOTE) The Xpert Xpress SARS-CoV-2/FLU/RSV plus assay is intended as an aid in the diagnosis of influenza from Nasopharyngeal swab specimens and should not be used as a sole basis for treatment. Nasal washings and aspirates are unacceptable for Xpert Xpress SARS-CoV-2/FLU/RSV testing.  Fact Sheet for Patients: EntrepreneurPulse.com.au  Fact Sheet for Healthcare Providers: IncredibleEmployment.be  This test is not yet approved or cleared by the [place] FDA and has been authorized for detection and/or diagnosis of SARS-CoV-2 by FDA under an Emergency Use Authorization (EUA). This EUA will remain in effect (meaning this test can be used) for the duration of the COVID-19 declaration under Section 564(b)(1) of the Act, 21 U.S.C. section 360bbb-3(b)(1), unless the authorization is terminated  or revoked.     Resp Syncytial Virus by PCR POSITIVE (A) NEGATIVE Final    Comment: (NOTE) Fact Sheet for Patients: EntrepreneurPulse.com.au  Fact Sheet for Healthcare Providers: IncredibleEmployment.be  This test is not yet approved or cleared by the [place] FDA and has been authorized for detection and/or diagnosis of SARS-CoV-2 by FDA under an Emergency Use Authorization (EUA). This EUA will remain in effect (meaning this test can be used) for the duration of the COVID-19 declaration under Section 564(b)(1) of the Act, 21 U.S.C. section 360bbb-3(b)(1), unless the authorization is terminated or revoked.  Performed at [facility], [place] [address] [place], [place] [address]      Labs: BNP (last 3 results) No results for input(s): "BNP" in the last 8760 hours. Basic Metabolic Panel: Recent Labs  Lab 02/07/22 0454 02/07/22 1313 02/08/22 0037 02/09/22 1449 02/10/22 0358  NA 135  --  132* 130* 133*  K 3.8  --  3.9 3.4* 3.8  CL 106  --  106 101 109  CO2 19*  --  18* 21* 20*  GLUCOSE 123*  --  98 130* 81  BUN 13  --  '16 19 17  '$ CREATININE 1.17  --  1.18 1.15 0.92  CALCIUM 8.3*  --  7.9* 8.1* 7.6*  MG  --  1.7  --  2.0  --    Liver Function Tests: No results for input(s): "AST", "ALT", "ALKPHOS", "BILITOT", "PROT", "ALBUMIN" in the last 168 hours. No results for input(s): "LIPASE", "AMYLASE" in the last 168 hours. No results for input(s): "AMMONIA" in the last 168 hours. CBC: Recent Labs  Lab 02/07/22 0454 02/08/22 0037 02/09/22 1449  WBC 15.8* 10.4 9.2  NEUTROABS  --   --  5.4  HGB 10.1* 9.6*  11.1*  HCT 31.7* 30.1* 35.9*  MCV 90.1 89.6 89.5  PLT 220 188 249   Cardiac Enzymes: No results for input(s): "CKTOTAL", "CKMB", "CKMBINDEX", "TROPONINI" in the last 168 hours. BNP: Invalid input(s): "POCBNP" CBG: No results for input(s): "GLUCAP" in the last 168 hours. D-Dimer No results for input(s): "DDIMER" in the  last 72 hours. Hgb A1c No results for input(s): "HGBA1C" in the last 72 hours. Lipid Profile No results for input(s): "CHOL", "HDL", "LDLCALC", "TRIG", "CHOLHDL", "LDLDIRECT" in the last 72 hours. Thyroid function studies Recent Labs    02/07/22 1313  TSH 1.676   Anemia work up No results for input(s): "VITAMINB12", "FOLATE", "FERRITIN", "TIBC", "IRON", "RETICCTPCT" in the last 72 hours. Urinalysis    Component Value Date/Time   COLORURINE YELLOW 12/03/2021 2212   APPEARANCEUR CLEAR 12/03/2021 2212   APPEARANCEUR Clear 09/20/2017 1337   LABSPEC 1.014 12/03/2021 2212   PHURINE 6.0 12/03/2021 2212   GLUCOSEU NEGATIVE 12/03/2021 2212   HGBUR NEGATIVE 12/03/2021 2212   BILIRUBINUR NEGATIVE 12/03/2021 2212   BILIRUBINUR Negative 09/20/2017 [place] 12/03/2021 2212   PROTEINUR NEGATIVE 12/03/2021 2212   UROBILINOGEN negative 12/31/2013 1022   UROBILINOGEN 0.2 04/18/2013 2026   NITRITE NEGATIVE 12/03/2021 2212   LEUKOCYTESUR NEGATIVE 12/03/2021 2212   Sepsis Labs Recent Labs  Lab 02/07/22 0454 02/08/22 0037 02/09/22 1449  WBC 15.8* 10.4 9.2   Microbiology Recent Results (from the past 240 hour(s))  Resp panel by RT-PCR (RSV, Flu A&B, Covid) Anterior Nasal Swab     Status: Abnormal   Collection Time: 02/07/22 12:28 PM   Specimen: Anterior Nasal Swab  Result Value Ref Range Status   SARS Coronavirus 2 by RT PCR NEGATIVE NEGATIVE Final    Comment: (NOTE) SARS-CoV-2 target nucleic acids are NOT DETECTED.  The SARS-CoV-2 RNA is generally detectable in upper respiratory specimens during the acute phase of infection. The lowest concentration of SARS-CoV-2 viral copies this assay can detect is 138 copies/mL. A negative result does not preclude SARS-Cov-2 infection and should not be used as the sole basis for treatment or other patient management decisions. A negative result may occur with  improper specimen collection/handling, submission of specimen  other than nasopharyngeal swab, presence of viral mutation(s) within the areas targeted by this assay, and inadequate number of viral copies(<138 copies/mL). A negative result must be combined with clinical observations, patient history, and epidemiological information. The expected result is Negative.  Fact Sheet for Patients:  EntrepreneurPulse.com.au  Fact Sheet for Healthcare Providers:  IncredibleEmployment.be  This test is no t yet approved or cleared by the [place] FDA and  has been authorized for detection and/or diagnosis of SARS-CoV-2 by FDA under an Emergency Use Authorization (EUA). This EUA will remain  in effect (meaning this test can be used) for the duration of the COVID-19 declaration under Section 564(b)(1) of the Act, 21 U.S.C.section 360bbb-3(b)(1), unless the authorization is terminated  or revoked sooner.       Influenza A by PCR NEGATIVE NEGATIVE Final   Influenza B by PCR NEGATIVE NEGATIVE Final    Comment: (NOTE) The Xpert Xpress SARS-CoV-2/FLU/RSV plus assay is intended as an aid in the diagnosis of influenza from Nasopharyngeal swab specimens and should not be used as a sole basis for treatment. Nasal washings and aspirates are unacceptable for Xpert Xpress SARS-CoV-2/FLU/RSV testing.  Fact Sheet for Patients: EntrepreneurPulse.com.au  Fact Sheet for Healthcare Providers: IncredibleEmployment.be  This test is not yet approved or cleared by the [place]  FDA and has been authorized for detection and/or diagnosis of SARS-CoV-2 by FDA under an Emergency Use Authorization (EUA). This EUA will remain in effect (meaning this test can be used) for the duration of the COVID-19 declaration under Section 564(b)(1) of the Act, 21 U.S.C. section 360bbb-3(b)(1), unless the authorization is terminated or revoked.     Resp Syncytial Virus by PCR POSITIVE (A) NEGATIVE Final     Comment: (NOTE) Fact Sheet for Patients: EntrepreneurPulse.com.au  Fact Sheet for Healthcare Providers: IncredibleEmployment.be  This test is not yet approved or cleared by the [place] FDA and has been authorized for detection and/or diagnosis of SARS-CoV-2 by FDA under an Emergency Use Authorization (EUA). This EUA will remain in effect (meaning this test can be used) for the duration of the COVID-19 declaration under Section 564(b)(1) of the Act, 21 U.S.C. section 360bbb-3(b)(1), unless the authorization is terminated or revoked.  Performed at [facility], [place] [address], [place], [place] [address]      Time coordinating discharge: 35 minutes  SIGNED:   Rodena Goldmann, DO [facility] 02/10/2022, 11:55 AM  If 7PM-7AM, please contact night-coverage [email or web address]

## 2022-02-10 NOTE — Progress Notes (Signed)
Patient discharged home with instructions given on medications and follow up visits,patient verbalized understanding. Prescriptions sent to Pharmacy of choice documented on AVS. IV discontinued catheter intact. Accompanied by staff to an awaiting vehicle.

## 2022-02-10 NOTE — TOC Transition Note (Signed)
Transition of Care [facility]) - CM/SW Discharge Note   Patient Details  Name: [name] MRN: [id number] Date of Birth: 02-Jul-1932  [facility] CM/SW Contact:  Salome Arnt, LCSW Phone Number: 02/10/2022, 11:55 AM   Clinical Narrative:  Pt d/c today. LCSW discussed d/c with pt's daughter, [name] as RN reports she has some questions. [name] states pt was d/c from [facility] Tuesday. He came to [facility] the next day as he ran out of O2 and was weak. PT evaluated pt this morning and no follow up needed. Pt does qualify for home O2. [name] indicates pt's O2 from several years ago was with [facility]. She is agreeable to [facility] now. Referral made to [facility] with [facility]. Discussed PT evaluation. Pt has a caregiver 3 mornings a week. They feel pt will need ALF soon. [name] is aware this is private pay and said pt has resources. Provided [name]'s number with [facility] to assist with placement. Family also plans to discuss with PCP at next appointment. Pt's son-in-law will pick up pt this afternoon. RN and MD updated.       Final next level of care: Home/Self Care Barriers to Discharge: Barriers Resolved   Patient Goals and CMS Choice     Choice offered to / list presented to : Adult Children  Discharge Placement                  Name of family member notified: [name]- daughter Patient and family notified of of transfer: 02/10/22  Discharge Plan and Services                DME Arranged: Oxygen DME Agency: [facility] Date DME Agency Contacted: 02/10/22 Time DME Agency Contacted: [id number] Representative spoke with at DME Agency: [place] Interventions     Readmission Risk Interventions    10/05/2021   10:34 AM  Readmission Risk Prevention Plan  Post Dischage Appt Complete  Medication Screening Complete  Transportation Screening Complete

## 2022-02-10 NOTE — Progress Notes (Signed)
Oxygen saturation qualification reviewed.  Patient qualifies for 2 L nasal cannula oxygen.

## 2022-02-11 NOTE — Telephone Encounter (Signed)
Transition Care Management Follow-up Telephone Call Date of discharge and from where: [facility] 02/10/2022 How have you been since you were released from the hospital? [facility]  Any questions or concerns? No  Items Reviewed: Did the pt receive and understand the discharge instructions provided? Yes  Medications obtained and verified? Yes  Other? No  Any new allergies since your discharge? No  Dietary orders reviewed? Yes Do you have support at home? Yes   Home Care and Equipment/Supplies: Were home health services ordered? not applicable If so, what is the name of the agency? N/a  Has the agency set up a time to come to the patient's home? not applicable Were any new equipment or medical supplies ordered?  No What is the name of the medical supply agency? N/a Were you able to get the supplies/equipment? not applicable Do you have any questions related to the use of the equipment or supplies? No  Functional Questionnaire: (I = Independent and D = Dependent) ADLs: I  Bathing/Dressing- i  Meal Prep- i  Eating- i  Maintaining continence- i  Transferring/Ambulation- i  Managing Meds- i  Follow up appointments reviewed:  PCP Hospital f/u appt confirmed? Yes  Scheduled to see Dr.Dettinger  on 02/15/2022 @ 305pm. [facility] f/u appt confirmed? No    Are transportation arrangements needed? No  If their condition worsens, is the pt aware to call PCP or go to the Emergency Dept.? Yes Was the patient provided with contact information for the PCP's office or ED? Yes Was to pt encouraged to call back with questions or concerns? Yes

## 2022-02-15 ENCOUNTER — Ambulatory Visit (INDEPENDENT_AMBULATORY_CARE_PROVIDER_SITE_OTHER): Payer: Medicare Other | Admitting: Nurse Practitioner

## 2022-02-15 ENCOUNTER — Encounter: Payer: Self-pay | Admitting: Nurse Practitioner

## 2022-02-15 VITALS — BP 118/73 | HR 117 | Temp 97.2°F | Resp 20 | Ht 66.0 in | Wt 156.0 lb

## 2022-02-15 DIAGNOSIS — I4819 Other persistent atrial fibrillation: Secondary | ICD-10-CM | POA: Diagnosis not present

## 2022-02-15 DIAGNOSIS — R7981 Abnormal blood-gas level: Secondary | ICD-10-CM | POA: Diagnosis not present

## 2022-02-15 DIAGNOSIS — R079 Chest pain, unspecified: Secondary | ICD-10-CM

## 2022-02-15 DIAGNOSIS — Z7689 Persons encountering health services in other specified circumstances: Secondary | ICD-10-CM

## 2022-02-15 MED ORDER — METOPROLOL SUCCINATE ER 50 MG PO TB24: 50.0000 mg | ORAL_TABLET | Freq: Every day | ORAL | 3 refills | Status: AC

## 2022-02-15 NOTE — Progress Notes (Addendum)
   Subjective:    Patient ID: [name], male    DOB: 1932-11-14, 88 y.o.   MRN: [id number]  Today's visit was for Transitional Care Management.  The patient was discharged from [facility] on 02/08/22 with a primary diagnosis of chest pain.   Contact with the patient and/or caregiver, by a clinical staff member, was made on 01/1322 and was documented as a telephone encounter within the EMR.  Through chart review and discussion with the patient I have determined that management of their condition is of high complexity.     Patient went to the hospital on 02/07/22 with chest pain. They actually said he had RSV that was flaring up his atrial fib. He was discharge on 02/08/22. He went back to the hospital on 02/09/22 with RSV symptoms and coughing. He was sent home on oxygen and was told to continue all meds. His family says that if he takes his o2 off his sats drpp below 90 quickly.  Family said the first time he was discharged on 02/08/22 he left AMA.  Review of Systems  Constitutional:  Negative for diaphoresis.  Eyes:  Negative for pain.  Respiratory:  Positive for shortness of breath.   Cardiovascular:  Negative for chest pain, palpitations and leg swelling.  Gastrointestinal:  Negative for abdominal pain.  Endocrine: Negative for polydipsia.  Skin:  Negative for rash.  Neurological:  Negative for dizziness, weakness and headaches.  Hematological:  Does not bruise/bleed easily.  All other systems reviewed and are negative.      Objective:   Physical Exam Vitals reviewed.  Constitutional:      Appearance: Normal appearance.  Cardiovascular:     Rate and Rhythm: Normal rate. Rhythm irregular.     Heart sounds: Normal heart sounds.  Pulmonary:     Effort: Pulmonary effort is normal.     Breath sounds: Normal breath sounds.  Skin:    General: Skin is warm.  Neurological:     General: No focal deficit present.     Mental Status: He is alert and oriented to person,  place, and time.  Psychiatric:        Mood and Affect: Mood normal.        Behavior: Behavior normal.     BP 118/73   Pulse (!) 117   Temp (!) 97.2 F (36.2 C) (Temporal)   Resp 20   Ht '5\' 6"'$  (1.676 m)   Wt 156 lb (70.8 kg)   SpO2 95%   BMI 25.18 kg/m        Assessment & Plan:   [name] in today with chief complaint of No chief complaint on file.   Chest  pain   2. Encounter for support and coordination of transition of care Hospital records reviewed  3. Low O2 saturation Continue o2 24/7  4. Persistent atrial fibrillation ([place]) Make sure takes baby aspirin daily Keep follow up appointment with Dr. Warren Lacy    The above assessment and management plan was discussed with the patient. The patient verbalized understanding of and has agreed to the management plan. Patient is aware to call the clinic if symptoms persist or worsen. Patient is aware when to return to the clinic for a follow-up visit. Patient educated on when it is appropriate to go to the emergency department.   Mary-Margaret Hassell Done, FNP

## 2022-02-15 NOTE — Patient Instructions (Signed)
Atrial Fibrillation  Atrial fibrillation is a type of heartbeat that is irregular or fast. If you have this condition, your heart beats without any order. This makes it hard for your heart to pump blood in a normal way. Atrial fibrillation may come and go, or it may become a long-lasting problem. If this condition is not treated, it can put you at higher risk for stroke, heart failure, and other heart problems. What are the causes? This condition may be caused by diseases that damage the heart. They include: High blood pressure. Heart failure. Heart valve disease. Heart surgery. Other causes include: Diabetes. Thyroid disease. Being overweight. Kidney disease. Sometimes the cause is not known. What increases the risk? You are more likely to develop this condition if: You are older. You smoke. You exercise often and very hard. You have a family history of this condition. You are a man. You use drugs. You drink a lot of alcohol. You have lung conditions, such as emphysema, pneumonia, or COPD. You have sleep apnea. What are the signs or symptoms? Common symptoms of this condition include: A feeling that your heart is beating very fast. Chest pain or discomfort. Feeling short of breath. Suddenly feeling light-headed or weak. Getting tired easily during activity. Fainting. Sweating. In some cases, there are no symptoms. How is this treated? Treatment for this condition depends on underlying conditions and how you feel when you have atrial fibrillation. They include: Medicines to: Prevent blood clots. Treat heart rate or heart rhythm problems. Using devices, such as a pacemaker, to correct heart rhythm problems. Doing surgery to remove the part of the heart that sends bad signals. Closing an area where clots can form in the heart (left atrial appendage). In some cases, your doctor will treat other underlying conditions. Follow these instructions at home: Medicines Take  over-the-counter and prescription medicines only as told by your doctor. Do not take any new medicines without first talking to your doctor. If you are taking blood thinners: Talk with your doctor before you take any medicines that have aspirin or NSAIDs, such as ibuprofen, in them. Take your medicine exactly as told by your doctor. Take it at the same time each day. Avoid activities that could hurt or bruise you. Follow instructions about how to prevent falls. Wear a bracelet that says you are taking blood thinners. Or, carry a card that lists what medicines you take. Lifestyle     Do not use any products that have nicotine or tobacco in them. These include cigarettes, e-cigarettes, and chewing tobacco. If you need help quitting, ask your doctor. Eat heart-healthy foods. Talk with your doctor about the right eating plan for you. Exercise regularly as told by your doctor. Do not drink alcohol. Lose weight if you are overweight. Do not use drugs, including cannabis. General instructions If you have a condition that causes breathing to stop for a short period of time (apnea), treat it as told by your doctor. Keep a healthy weight. Do not use diet pills unless your doctor says they are safe for you. Diet pills may make heart problems worse. Keep all follow-up visits as told by your doctor. This is important. Contact a doctor if: You notice a change in the speed, rhythm, or strength of your heartbeat. You are taking a blood-thinning medicine and you get more bruising. You get tired more easily when you move or exercise. You have a sudden change in weight. Get help right away if:  You have pain in  your chest or your belly (abdomen). You have trouble breathing. You have side effects of blood thinners, such as blood in your vomit, poop (stool), or pee (urine), or bleeding that cannot stop. You have any signs of a stroke. "BE FAST" is an easy way to remember the main warning signs: B -  Balance. Signs are dizziness, sudden trouble walking, or loss of balance. E - Eyes. Signs are trouble seeing or a change in how you see. F - Face. Signs are sudden weakness or loss of feeling in the face, or the face or eyelid drooping on one side. A - Arms. Signs are weakness or loss of feeling in an arm. This happens suddenly and usually on one side of the body. S - Speech. Signs are sudden trouble speaking, slurred speech, or trouble understanding what people say. T - Time. Time to call emergency services. Write down what time symptoms started. You have other signs of a stroke, such as: A sudden, very bad headache with no known cause. Feeling like you may vomit (nausea). Vomiting. A seizure. These symptoms may be an emergency. Do not wait to see if the symptoms will go away. Get medical help right away. Call your local emergency services (911 in the [place] Do not drive yourself to the hospital. Summary Atrial fibrillation is a type of heartbeat that is irregular or fast. You are at higher risk of this condition if you smoke, are older, have diabetes, or are overweight. Follow your doctor's instructions about medicines, diet, exercise, and follow-up visits. Get help right away if you have signs or symptoms of a stroke. Get help right away if you cannot catch your breath, or you have chest pain or discomfort. This information is not intended to replace advice given to you by your health care provider. Make sure you discuss any questions you have with your health care provider. Document Revised: 08/08/2018 Document Reviewed: 08/08/2018 Elsevier Patient Education  [place].

## 2022-02-16 ENCOUNTER — Inpatient Hospital Stay: Payer: Medicare Other | Admitting: Family Medicine

## 2022-02-17 NOTE — Progress Notes (Signed)
O2 order faxed to [facility] [phone]

## 2022-02-17 NOTE — Telephone Encounter (Signed)
Closing encounter, pt seen for TOC on 02/15/22 & order faxed to [facility]

## 2022-02-18 ENCOUNTER — Telehealth: Payer: Self-pay | Admitting: Family Medicine

## 2022-02-18 ENCOUNTER — Telehealth: Payer: Self-pay

## 2022-02-18 NOTE — Telephone Encounter (Signed)
Patient's daughter wanted to let you know they will be placing her dad in assisted living.  The plan is to tell him to tomorrow and place him by next weekend.  They have found a place in [place] on [place], [place].  They are very pleased with everything they have learned about the facility and toured it and spoke with family members and residents are very pleased with the facility.  She wanted to reach out and let you know and to thank you for taking great care of her dad.

## 2022-02-18 NOTE — Telephone Encounter (Signed)
Calling to check on order for O2 tank. Please call back

## 2022-02-18 NOTE — Telephone Encounter (Signed)
Spoke with patient's daughter and they have since making this call gotten what they need.

## 2022-02-18 NOTE — Telephone Encounter (Signed)
Blood that he is getting good care, let us know if anything in the future.  We enjoyed taking care of him and hope to see him again

## 2022-02-22 ENCOUNTER — Other Ambulatory Visit: Payer: Self-pay | Admitting: Family Medicine

## 2022-02-22 ENCOUNTER — Telehealth: Payer: Self-pay

## 2022-02-22 MED ORDER — ALBUTEROL SULFATE (2.5 MG/3ML) 0.083% IN NEBU: 2.5000 mg | INHALATION_SOLUTION | RESPIRATORY_TRACT | 0 refills | Status: DC | PRN

## 2022-02-22 NOTE — Telephone Encounter (Signed)
Patient needs a presctiption for:  albuterol (2.5 MG/3ML) 0.083% nebulizer solution Commonly known as: PROVENTIL Take 3 mLs (2.5 mg total) by nebulization every 4 (four) hours as needed for wheezing or shortness of breath.  Not on current med list but was using while in hospital.  Now that patient is home he needs this.

## 2022-02-23 ENCOUNTER — Ambulatory Visit (INDEPENDENT_AMBULATORY_CARE_PROVIDER_SITE_OTHER): Payer: Medicare Other | Admitting: *Deleted

## 2022-02-23 DIAGNOSIS — Z111 Encounter for screening for respiratory tuberculosis: Secondary | ICD-10-CM | POA: Diagnosis not present

## 2022-02-25 ENCOUNTER — Ambulatory Visit: Payer: Medicare Other | Admitting: *Deleted

## 2022-02-25 DIAGNOSIS — Z111 Encounter for screening for respiratory tuberculosis: Secondary | ICD-10-CM

## 2022-02-25 LAB — TB SKIN TEST: TB Skin Test: NEGATIVE

## 2022-02-25 NOTE — Progress Notes (Signed)
TB test read, 53m, negative

## 2022-02-25 NOTE — Addendum Note (Signed)
Addended by: Antonietta Barcelona D on: 02/25/2022 10:50 AM   Modules accepted: Orders

## 2022-03-01 ENCOUNTER — Telehealth: Payer: Self-pay | Admitting: Family Medicine

## 2022-03-01 NOTE — Telephone Encounter (Signed)
Let Weniger know that MMM wanted pt's PCP to sign FL2 form and that PCP will be back in the office tomorrow. Cheek acknowledged this, was pleasant but pt has placement in facility for today and this will put placement off till Thursday.   Ppw completed and picked up

## 2022-03-08 ENCOUNTER — Telehealth: Payer: Self-pay | Admitting: "Endocrinology

## 2022-03-08 NOTE — Telephone Encounter (Signed)
New message    The patient daughter is calling - her father is in an assisted living in [place] , they preferred a [facility] due to travel time and cost for the family $50.00 each way.    Previous patient of Dr. Loanne Drilling aware that he has retired.

## 2022-03-09 NOTE — Telephone Encounter (Signed)
Noted  

## 2022-03-10 ENCOUNTER — Encounter: Payer: Self-pay | Admitting: Primary Care

## 2022-03-10 ENCOUNTER — Encounter: Payer: Self-pay | Admitting: "Endocrinology

## 2022-03-10 ENCOUNTER — Ambulatory Visit: Payer: Medicare Other | Admitting: Primary Care

## 2022-03-10 ENCOUNTER — Ambulatory Visit (INDEPENDENT_AMBULATORY_CARE_PROVIDER_SITE_OTHER)
Admission: RE | Admit: 2022-03-10 | Discharge: 2022-03-10 | Disposition: A | Discharge status: Home / Self Care | Payer: Medicare Other | Source: Ambulatory Visit | Attending: Primary Care | Admitting: Primary Care

## 2022-03-10 VITALS — BP 110/80 | HR 99 | Ht 66.0 in | Wt 167.0 lb

## 2022-03-10 DIAGNOSIS — R0602 Shortness of breath: Secondary | ICD-10-CM | POA: Diagnosis not present

## 2022-03-10 DIAGNOSIS — J471 Bronchiectasis with (acute) exacerbation: Secondary | ICD-10-CM

## 2022-03-10 DIAGNOSIS — I4891 Unspecified atrial fibrillation: Secondary | ICD-10-CM

## 2022-03-10 DIAGNOSIS — I5032 Chronic diastolic (congestive) heart failure: Secondary | ICD-10-CM | POA: Diagnosis not present

## 2022-03-10 DIAGNOSIS — J9611 Chronic respiratory failure with hypoxia: Secondary | ICD-10-CM

## 2022-03-10 DIAGNOSIS — J449 Chronic obstructive pulmonary disease, unspecified: Secondary | ICD-10-CM

## 2022-03-10 LAB — BASIC METABOLIC PANEL
BUN: 19 mg/dL (ref 6–23)
CO2: 23 mEq/L (ref 19–32)
Calcium: 8.2 mg/dL — ABNORMAL LOW (ref 8.4–10.5)
Chloride: 108 mEq/L (ref 96–112)
Creatinine, Ser: 1.12 mg/dL (ref 0.40–1.50)
GFR: 59.07 mL/min — ABNORMAL LOW (ref >60.00)
Glucose, Bld: 106 mg/dL — ABNORMAL HIGH (ref 70–99)
Potassium: 4.3 mEq/L (ref 3.5–5.1)
Sodium: 141 mEq/L (ref 135–145)

## 2022-03-10 LAB — CBC WITH DIFFERENTIAL/PLATELET
Basophils Absolute: 0.1 10*3/uL (ref 0.0–0.1)
Basophils Relative: 0.6 % (ref 0.0–3.0)
Eosinophils Absolute: 0 10*3/uL (ref 0.0–0.7)
Eosinophils Relative: 0.4 % (ref 0.0–5.0)
HCT: 33.8 % — ABNORMAL LOW (ref 39.0–52.0)
Hemoglobin: 10.7 g/dL — ABNORMAL LOW (ref 13.0–17.0)
Lymphocytes Relative: 5.9 % — ABNORMAL LOW (ref 12.0–46.0)
Lymphs Abs: 0.7 10*3/uL (ref 0.7–4.0)
MCHC: 31.6 g/dL (ref 30.0–36.0)
MCV: 88.8 fl (ref 78.0–100.0)
Monocytes Absolute: 1.8 10*3/uL — ABNORMAL HIGH (ref 0.1–1.0)
Monocytes Relative: 14.7 % — ABNORMAL HIGH (ref 3.0–12.0)
Neutro Abs: 9.3 10*3/uL — ABNORMAL HIGH (ref 1.4–7.7)
Neutrophils Relative %: 78.4 % — ABNORMAL HIGH (ref 43.0–77.0)
Platelets: 267 10*3/uL (ref 150.0–400.0)
RBC: 3.81 Mil/uL — ABNORMAL LOW (ref 4.22–5.81)
RDW: 21.3 % — ABNORMAL HIGH (ref 11.5–15.5)
WBC: 11.9 10*3/uL — ABNORMAL HIGH (ref 4.0–10.5)

## 2022-03-10 LAB — BRAIN NATRIURETIC PEPTIDE: Pro B Natriuretic peptide (BNP): 679 pg/mL — ABNORMAL HIGH (ref 0.0–100.0)

## 2022-03-10 MED ORDER — LEVOFLOXACIN 500 MG PO TABS: 500.0000 mg | ORAL_TABLET | Freq: Every day | ORAL | 0 refills | Status: AC

## 2022-03-10 MED ORDER — DM-GUAIFENESIN ER 30-600 MG PO TB12: 1.0000 | ORAL_TABLET | Freq: Two times a day (BID) | ORAL | 1 refills | Status: AC

## 2022-03-10 MED ORDER — PREDNISONE 20 MG PO TABS: 40.0000 mg | ORAL_TABLET | Freq: Every day | ORAL | 0 refills | Status: AC

## 2022-03-10 MED ORDER — SODIUM CHLORIDE 3 % IN NEBU: INHALATION_SOLUTION | RESPIRATORY_TRACT | 12 refills | Status: AC | PRN

## 2022-03-10 MED ORDER — ALBUTEROL SULFATE (2.5 MG/3ML) 0.083% IN NEBU: 2.5000 mg | INHALATION_SOLUTION | RESPIRATORY_TRACT | 0 refills | Status: AC | PRN

## 2022-03-10 NOTE — Assessment & Plan Note (Addendum)
-   Patient developed productive cough 2-3 weeks ago. Sputum is thick which, occasionl blood tinge. He has not been following pulmonary hygiene routine. CXR today showed increased bilateral pulmonary opacities. Checking basic labs. Sending in course Levaquin '500mg'$  qd x 10 days and prednisone '40mg'$  x 5 days. Advised he restart mucinex-dm '600mg'$  twice daily and resume hypertonic saline nebulizer 2-3 times a day to loose congestion. Needs close follow-up in 1 week, if symptoms worsen will likely need admission. Awaiting labs to return.

## 2022-03-10 NOTE — Assessment & Plan Note (Addendum)
-   HR 97-117 today in office - Advised he take Toprol XL '50mg'$  when he gets home  - He hs follow-up with cardiology later this month

## 2022-03-10 NOTE — Patient Instructions (Addendum)
Recommendations: - Start Levaquin antibiotic and prednisone as prescribed  - Continue Symbicort 173mg two puffs morning and evening  - You can use Albuterol inhaler OR nebulizer every 4-6 hours for shortness of breath/wheezing  - Start Mucinex-DM '600mg'$  morning and evening - Start Hypertonic saline nebulizer 2-3 times a day to loosen congestion; Followed by flutter valve  - Follow GERD diet strictly  - If symptoms worsen please go to ED for evaluation   Orders: - CXR today (ordered) - Labs today (ordered)  Follow-up: - 7-10 days with BEustaquio MaizeNP    Food Choices for Gastroesophageal Reflux Disease, Adult When you have gastroesophageal reflux disease (GERD), the foods you eat and your eating habits are very important. Choosing the right foods can help ease your discomfort. Think about working with a food expert (dietitian) to help you make good choices. What are tips for following this plan? Reading food labels Look for foods that are low in saturated fat. Foods that may help with your symptoms include: Foods that have less than 5% of daily value (DV) of fat. Foods that have 0 grams of trans fat. Cooking Do not fry your food. Cook your food by baking, steaming, grilling, or broiling. These are all methods that do not need a lot of fat for cooking. To add flavor, try to use herbs that are low in spice and acidity. Meal planning  Choose healthy foods that are low in fat, such as: Fruits and vegetables. Whole grains. Low-fat dairy products. Lean meats, fish, and poultry. Eat small meals often instead of eating 3 large meals each day. Eat your meals slowly in a place where you are relaxed. Avoid bending over or lying down until 2-3 hours after eating. Limit high-fat foods such as fatty meats or fried foods. Limit your intake of fatty foods, such as oils, butter, and shortening. Avoid the following as told by your doctor: Foods that cause symptoms. These may be different for different  people. Keep a food diary to keep track of foods that cause symptoms. Alcohol. Drinking a lot of liquid with meals. Eating meals during the 2-3 hours before bed. Lifestyle Stay at a healthy weight. Ask your doctor what weight is healthy for you. If you need to lose weight, work with your doctor to do so safely. Exercise for at least 30 minutes on 5 or more days each week, or as told by your doctor. Wear loose-fitting clothes. Do not smoke or use any products that contain nicotine or tobacco. If you need help quitting, ask your doctor. Sleep with the head of your bed higher than your feet. Use a wedge under the mattress or blocks under the bed frame to raise the head of the bed. Chew sugar-free gum after meals. What foods should eat?  Eat a healthy, well-balanced diet of fruits, vegetables, whole grains, low-fat dairy products, lean meats, fish, and poultry. Each person is different. Foods that may cause symptoms in one person may not cause any symptoms in another person. Work with your doctor to find foods that are safe for you. The items listed above may not be a complete list of what you can eat and drink. Contact a food expert for more options. What foods should I avoid? Limiting some of these foods may help in managing the symptoms of GERD. Everyone is different. Talk with a food expert or your doctor to help you find the exact foods to avoid, if any. Fruits Any fruits prepared with added fat. Any fruits  that cause symptoms. For some people, this may include citrus fruits, such as oranges, grapefruit, pineapple, and lemons. Vegetables Deep-fried vegetables. [place] fries. Any vegetables prepared with added fat. Any vegetables that cause symptoms. For some people, this may include tomatoes and tomato products, chili peppers, onions and garlic, and horseradish. Grains Pastries or quick breads with added fat. Meats and other proteins High-fat meats, such as fatty beef or pork, hot dogs,  ribs, ham, sausage, salami, and bacon. Fried meat or protein, including fried fish and fried chicken. Nuts and nut butters, in large amounts. Dairy Whole milk and chocolate milk. Sour cream. Cream. Ice cream. Cream cheese. Milkshakes. Fats and oils Butter. Margarine. Shortening. Ghee. Beverages Coffee and tea, with or without caffeine. Carbonated beverages. Sodas. Energy drinks. Fruit juice made with acidic fruits, such as orange or grapefruit. Tomato juice. Alcoholic drinks. Sweets and desserts Chocolate and cocoa. Donuts. Seasonings and condiments Pepper. Peppermint and spearmint. Added salt. Any condiments, herbs, or seasonings that cause symptoms. For some people, this may include curry, hot sauce, or vinegar-based salad dressings. The items listed above may not be a complete list of what you should not eat and drink. Contact a food expert for more options. Questions to ask your doctor Diet and lifestyle changes are often the first steps that are taken to manage symptoms of GERD. If diet and lifestyle changes do not help, talk with your doctor about taking medicines. Where to find more information [employer] G[employer]: aboutgerd.org Summary When you have GERD, food and lifestyle choices are very important in easing your symptoms. Eat small meals often instead of 3 large meals a day. Eat your meals slowly and in a place where you are relaxed. Avoid bending over or lying down until 2-3 hours after eating. Limit high-fat foods such as fatty meats or fried foods. This information is not intended to replace advice given to you by your health care provider. Make sure you discuss any questions you have with your health care provider. Document Revised: 08/26/2019 Document Reviewed: 08/26/2019 Elsevier Patient Education  [place].

## 2022-03-10 NOTE — Assessment & Plan Note (Signed)
-   Patient has 2-3BLE edema/ associated dyspnea  - Checking BNP, BMET  - Consider adding low dose diuretic for a couple of days if kidney function is ok

## 2022-03-10 NOTE — Progress Notes (Signed)
$'[name]$  ID: [name], male    DOB: April 12, 1932, 89 y.o.   MRN: [id number]  Chief Complaint  Patient presents with   Follow-up    Cough productive unable to take a deep breathe without coughing    Referring provider: Dettinger, [name], MD  HPI: 89 year old male, former smoker (quit 1980). PMH significant for COPD GOLD I, chronic bronchitis, bronchiectasis. Patient of Dr. Loanne Drilling, last seen on 03/05/21. Maintained on Symbicort 160 2 puffs bid. Vest ordered but cancelled at patients request. Takes ciprofloxacin x 7 days for flares.   03/10/2022- Interim hx  Patient presents today for acute visit d/t cough. Accompanied by his daughter. He has had a persistent cough for the last 2-3 weeks. Associated fatigue and some balance problems. Sputum is thick, sometimes bloody. He has not been taking mucinex or using saline nebs over the last week. He was admitted in mid December for RSV. He recently moved to assisted living which has been a big change for him. He is wearing his oxygen 24/7. He is using Symbicort and Albuterol as directed. He does not think he took his regular medication this morning.    Allergies  Allergen Reactions   Gadavist [Gadobutrol] Hives   Prednisone     Agitation and hallucinations   Betadine [Povidone Iodine] Other (See Comments)    Blisters    Lortab [Hydrocodone-Acetaminophen] Nausea And Vomiting   Penicillins Other (See Comments)    Lightheadedness  Has patient had a PCN reaction causing immediate rash, facial/tongue/throat swelling, SOB or lightheadedness with hypotension: Yes Has patient had a PCN reaction causing severe rash involving mucus membranes or skin necrosis: No Has patient had a PCN reaction that required hospitalization No Has patient had a PCN reaction occurring within the last 10 years: No If all of the above answers are "NO", then may proceed with Cephalosporin use.   Zetia [Ezetimibe] Other (See Comments)    Muscle weakness    Zocor  [Simvastatin] Nausea Only and Other (See Comments)    Muscle weakness     Immunization History  Administered Date(s) Administered   Fluad Quad(high Dose 65+) 12/10/2018, 01/17/2020, 01/13/2021, 12/01/2021   Influenza Whole 11/22/2007, 10/29/2009, 10/30/2010, 11/29/2011   Influenza, High Dose Seasonal PF 12/03/2015, 12/26/2016, 12/27/2017   Influenza,inj,Quad PF,6+ Mos 12/03/2012, 11/25/2013, 12/19/2014   Moderna Sars-Covid-2 Vaccination 04/11/2019, 05/10/2019, 03/31/2020   PPD Test 02/23/2022   Pneumococcal Conjugate-13 01/09/2013   Pneumococcal Polysaccharide-23 10/29/2009   Td 04/29/1998   Tdap 01/15/2019, 01/15/2019   Zoster Recombinat (Shingrix) 07/14/2021   Zoster, Live 03/31/2006    Past Medical History:  Diagnosis Date   Abnormality of gait 06/07/2013   Anemia    Aneurysm ([place])    on assending aorta, currently watching it, Dr Cyndia Bent   Anxiety    Arthritis    Asthma    Atrial fibrillation ([place])    B12 deficiency    Barrett's esophagus    CAD (coronary artery disease) 03/2006   3.0 x 20 mm TAXUS [place] DES to the LAD; 01/2007  L main 30%, oLAD 50%, pLAD stent ok, CFX 80%, OM 60%, pRCA 60%, mRCA 70%, oPDA 90%; med rx    Cancer ([place])    skin cancer    Cataract    bilateral removal of cateracts   COLONIC POLYPS 07/17/2008   Qualifier: Diagnosis of  By: Lovette Cliche, CNA, Christy     Complication of anesthesia     no issues,but pt prefers spinal due to Pulmonary problems  COPD (chronic obstructive pulmonary disease) ([place])    oxygen  on standby in home.   Depression    Diverticulosis    Emphysema    Enlarged prostate with urinary retention    Esophageal stenosis    Gastric ulcer    GERD (gastroesophageal reflux disease)    Hiatal hernia    Hypothyroidism    Neuropathy    Osteoporosis    Pituitary macroadenoma (HCC)    Restless legs syndrome (RLS)    Tubular adenoma of colon    UGIB (upper gastrointestinal bleed) 03/2013   EGD w/ large ulcer at GE junction     Tobacco History: Social History   Tobacco Use  Smoking Status Never  Smokeless Tobacco Never   Counseling given: Not Answered   Outpatient Medications Prior to Visit  Medication Sig Dispense Refill   Ascorbic Acid (VITAMIN C) 500 MG CAPS Take 1 capsule by mouth daily.     budesonide-formoterol (SYMBICORT) 160-4.5 MCG/ACT inhaler Inhale 2 puffs into the lungs in the morning and at bedtime. 10.2 g 6   cabergoline (DOSTINEX) 0.5 MG tablet Take 1 tablet (0.5 mg total) by mouth 2 (two) times a week. 24 tablet 1   calcium carbonate (OS-CAL - DOSED IN MG OF ELEMENTAL CALCIUM) 1250 (500 Ca) MG tablet Take 1 tablet by mouth daily with breakfast.     cholecalciferol (VITAMIN D) 1000 UNITS tablet Take 1,000 Units by mouth daily.      levalbuterol (XOPENEX HFA) 45 MCG/ACT inhaler Inhale 1 puff into the lungs every 4 (four) hours as needed for wheezing. 1 each 2   levothyroxine (SYNTHROID) 75 MCG tablet Take 1 tablet (75 mcg total) by mouth daily before breakfast. 90 tablet 1   metoprolol succinate (TOPROL-XL) 50 MG 24 hr tablet Take 1 tablet (50 mg total) by mouth daily. Take with or immediately following a meal. 90 tablet 3   Respiratory Therapy Supplies (FLUTTER) DEVI Twice a day and prn as needed, may increase if feeling worse 1 each 0   sucralfate (CARAFATE) 1 g tablet Take 1 tablet (1 g total) by mouth 4 (four) times daily -  with meals and at bedtime. 120 tablet 2   albuterol (PROVENTIL) (2.5 MG/3ML) 0.083% nebulizer solution Take 3 mLs (2.5 mg total) by nebulization every 4 (four) hours as needed for wheezing or shortness of breath. 30 mL 0   sodium chloride HYPERTONIC 3 % nebulizer solution Take by nebulization as needed for other. 750 mL 12   dextromethorphan 15 MG/5ML syrup Take 10 mLs (30 mg total) by mouth 4 (four) times daily as needed for cough. (Patient not taking: Reported on 03/10/2022) 120 mL 0   diclofenac Sodium (VOLTAREN) 1 % GEL Apply 2 g topically 4 (four) times daily.  (Patient not taking: Reported on 03/10/2022) 50 g 5   diphenhydrAMINE (BENADRYL) 25 MG tablet Take 25 mg by mouth every 6 (six) hours as needed for itching. (Patient not taking: Reported on 03/10/2022)     diphenhydramine-acetaminophen (TYLENOL PM) 25-500 MG TABS tablet Take 1/2 tablet by mouth at bedtime as needed for sleep (Patient not taking: Reported on 03/10/2022)     FEROSUL 325 (65 Fe) MG tablet TAKE (1) TABLET DAILY WITH BREAKFAST. (Patient not taking: Reported on 03/10/2022) 90 tablet 3   GAVILAX 17 GM/SCOOP powder DISSOLVE 17 GM SCOOP IN 8 OZ. OF WATER AND DRINK ONCE DAILY AS NEEDED FOR CONSTIPATION (Patient not taking: Reported on 03/10/2022) 510 g 0   magnesium oxide (MAG-OX) 400 (  241.3 Mg) MG tablet Take 1 tablet (400 mg total) by mouth 2 (two) times daily. (Patient not taking: Reported on 03/10/2022) 15 tablet 0   Melatonin 5 MG CAPS Take 1 capsule by mouth at bedtime. (Patient not taking: Reported on 03/10/2022)     naphazoline-pheniramine (NAPHCON-A) 0.025-0.3 % ophthalmic solution Place 1 drop into the left eye 2 (two) times daily as needed for irritation or allergies. (Patient not taking: Reported on 03/10/2022)     nitroGLYCERIN (NITROSTAT) 0.4 MG SL tablet Place 1 tablet (0.4 mg total) under the tongue every 5 (five) minutes as needed for chest pain. (Patient not taking: Reported on 03/10/2022) 25 tablet 2   pantoprazole (PROTONIX) 40 MG tablet Take 1 tablet (40 mg total) by mouth 2 (two) times daily. 60 tablet 2   psyllium (METAMUCIL) 28 % packet Take 1 packet by mouth daily. 30 packet 1   tamsulosin (FLOMAX) 0.4 MG CAPS capsule Take 1 capsule (0.4 mg total) by mouth daily after supper. (Patient not taking: Reported on 03/10/2022) 90 capsule 3   traMADol (ULTRAM) 50 MG tablet Take 1 tablet (50 mg total) by mouth 2 (two) times daily as needed. (Patient not taking: Reported on 03/10/2022) 30 tablet 2   Turmeric 500 MG CAPS Take 1 capsule by mouth daily. (Patient not taking: Reported on  03/10/2022)     vitamin B-12 (CYANOCOBALAMIN) 1000 MCG tablet Take 1,000 mcg by mouth daily. (Patient not taking: Reported on 03/10/2022)     vitamin E 180 MG (400 UNITS) capsule Take 400 Units by mouth daily. (Patient not taking: Reported on 03/10/2022)     GNP COUGH DM ER 30 MG/5ML liquid  (Patient not taking: Reported on 03/10/2022)     guaiFENesin (MUCINEX) 600 MG 12 hr tablet Take 1 tablet (600 mg total) by mouth 2 (two) times daily. (Patient not taking: Reported on 03/10/2022) 60 tablet 0   No facility-administered medications prior to visit.    Review of Systems  Review of Systems  Constitutional:  Positive for fatigue.  HENT:  Positive for congestion.   Respiratory:  Positive for cough and shortness of breath.   Cardiovascular:  Positive for leg swelling.    Physical Exam  BP 110/80 (BP Location: Right Arm, Cuff Size: Normal)   Pulse 99   Ht '5\' 6"'$  (1.676 m)   Wt 167 lb (75.8 kg)   SpO2 94%   BMI 26.95 kg/m  Physical Exam Constitutional:      Appearance: Normal appearance.  HENT:     Head: Normocephalic and atraumatic.     Mouth/Throat:     Mouth: Mucous membranes are moist.     Pharynx: Oropharynx is clear.  Cardiovascular:     Rate and Rhythm: Normal rate. Rhythm irregular.     Comments: HR 97-117; +2BLE edema  Pulmonary:     Effort: Pulmonary effort is normal.     Breath sounds: Rhonchi present. No wheezing.     Comments: Rhonchi right upper-middle lobe. No resp distress; O2 94% 4L pulsed - Able to speak in full sentences Musculoskeletal:        General: Normal range of motion.  Skin:    General: Skin is warm.  Neurological:     General: No focal deficit present.     Mental Status: He is alert and oriented to person, place, and time. Mental status is at baseline.  Psychiatric:        Mood and Affect: Mood normal.  Behavior: Behavior normal.        Thought Content: Thought content normal.        Judgment: Judgment normal.      Lab Results:  CBC     Component Value Date/Time   WBC 9.2 02/09/2022 1449   RBC 4.01 (L) 02/09/2022 1449   HGB 11.1 (L) 02/09/2022 1449   HGB 10.3 (L) 01/28/2022 1128   HCT 35.9 (L) 02/09/2022 1449   HCT 31.9 (L) 01/28/2022 1128   PLT 249 02/09/2022 1449   PLT 313 01/28/2022 1128   MCV 89.5 02/09/2022 1449   MCV 90 01/28/2022 1128   MCH 27.7 02/09/2022 1449   MCHC 30.9 02/09/2022 1449   RDW 17.5 (H) 02/09/2022 1449   RDW 15.7 (H) 01/28/2022 1128   LYMPHSABS 0.9 02/09/2022 1449   LYMPHSABS 1.2 01/28/2022 1128   MONOABS 2.4 (H) 02/09/2022 1449   EOSABS 0.1 02/09/2022 1449   EOSABS 0.4 01/28/2022 1128   BASOSABS 0.1 02/09/2022 1449   BASOSABS 0.2 01/28/2022 1128    BMET    Component Value Date/Time   NA 133 (L) 02/10/2022 0358   NA 140 12/17/2021 1618   K 3.8 02/10/2022 0358   CL 109 02/10/2022 0358   CO2 20 (L) 02/10/2022 0358   GLUCOSE 81 02/10/2022 0358   BUN 17 02/10/2022 0358   BUN 19 12/17/2021 1618   CREATININE 0.92 02/10/2022 0358   CALCIUM 7.6 (L) 02/10/2022 0358   GFRNONAA >60 02/10/2022 0358   GFRAA 74 04/22/2020 1125    BNP    Component Value Date/Time   BNP 224.9 (H) 06/12/2015 0033    ProBNP    Component Value Date/Time   PROBNP 261.9 04/18/2013 1421    Imaging: DG Chest 2 View  Result Date: 03/10/2022 CLINICAL DATA:  Bronchiectasis exacerbtion; Cough x 2-3 weeks EXAM: CHEST - 2 VIEW COMPARISON:  February 09, 2022 FINDINGS: The cardiomediastinal silhouette is unchanged in contour.Atherosclerotic calcifications. No pleural effusion. No pneumothorax. Increased confluent opacities in the RIGHT lung laterally, LEFT mid lung with overall increased bibasilar predominant reticulonodular opacities. Visualized abdomen is unremarkable. Multilevel degenerative changes of the thoracic spine. IMPRESSION: Increased bilateral pulmonary opacities favored to reflect multifocal infection superimposed on a background of emphysema. Recommend follow-up chest radiograph in 4-6 weeks after  treatment to ensure resolution. Electronically Signed   By: Valentino Saxon M.D.   On: 03/10/2022 11:07   CT Head Wo Contrast  Result Date: 02/09/2022 CLINICAL DATA:  Altered mental status EXAM: CT HEAD WITHOUT CONTRAST TECHNIQUE: Contiguous axial images were obtained from the base of the skull through the vertex without intravenous contrast. RADIATION DOSE REDUCTION: This exam was performed according to the departmental dose-optimization program which includes automated exposure control, adjustment of the mA and/or kV according to patient size and/or use of iterative reconstruction technique. COMPARISON:  None Available. FINDINGS: Brain: No evidence of acute infarction, hemorrhage, extra-axial collection or mass lesion/mass effect. Periventricular white matter small vessel ischemic changes noted. Ventricular prominence noted which is out of proportion to the extent of atrophy. This could be due to normal pressure hydrocephalus. Left frontal encephalomalacia versus dilated perivascular space noted. Vascular: No hyperdense vessel or unexpected calcification. Skull: Normal. Negative for fracture or focal lesion. Sinuses/Orbits: Mucoperiosteal thickening consistent with chronic maxillary and ethmoid sinusitis. Other: None. IMPRESSION: 1. Periventricular white-matter small-vessel ischemic changes. 2. Age-related involutional changes. 3. ventricular prominence appears out of proportion to the extent of atrophy which can be indicative of normal pressure hydrocephalus. Electronically Signed  By: Sammie Bench M.D.   On: 02/09/2022 19:45   DG Chest 2 View  Result Date: 02/09/2022 CLINICAL DATA:  Shortness of breath.  Recent diagnosis of RSV. EXAM: CHEST - 2 VIEW COMPARISON:  AP chest 02/07/2022 and 12/02/2021; CT chest 09/30/2021 FINDINGS: Cardiac silhouette and mediastinal contours are within normal limits. Moderate calcification within the aortic arch. Chronic bilateral interstitial thickening, greatest  within the lung bases are similar to 02/07/2022 and again mildly increased from 12/02/2021. This may represent an acute process superimposed on chronic scarring. No large pleural effusion is seen. No pneumothorax. Mild dextrocurvature of the midthoracic spine. Moderate multilevel degenerative disc changes. Chronic moderate to high-grade anterior T8 vertebral body height loss. IMPRESSION: Bilateral basilar linear densities are thickened compared to 12/02/2021, unchanged from recent 02/07/2022 radiographs. Again this may represent an acute process such as increased atelectasis versus pneumonia superimposed on chronic scarring. Electronically Signed   By: Yvonne Kendall M.D.   On: 02/09/2022 14:49     Assessment & Plan:   Bronchiectasis with acute exacerbation ([place]) - Patient developed productive cough 2-3 weeks ago. Sputum is thick which, occasionl blood tinge. He has not been following pulmonary hygiene routine. CXR today showed increased bilateral pulmonary opacities. Checking basic labs. Sending in course Levaquin '500mg'$  qd x 10 days and prednisone '40mg'$  x 5 days. Advised he restart mucinex-dm '600mg'$  twice daily and resume hypertonic saline nebulizer 2-3 times a day to loose congestion. Needs close follow-up in 1 week, if symptoms worsen will likely need admission. Awaiting labs to return.   Afib ([place]) - HR 97-117 today in office - Advised he take Toprol XL '50mg'$  when he gets home  - He hs follow-up with cardiology later this month   Diastolic CHF ([place]) - Patient has 2-3BLE edema/ associated dyspnea  - Checking BNP, BMET  - Consider adding low dose diuretic for a couple of days if kidney function is ok   COPD (chronic obstructive pulmonary disease) ([facility]) - Continue Symbicort 157mg two puffs twice daily and prn Albuterol hfa/neb every 4-6 hours for breakthrough shortness of breath or wheezing   Chronic respiratory failure with hypoxia ([place] - Stable; Wearing 2-2.5L oxygen continuously 24/7. O2 94%  on 4L pulsed  EMartyn Ehrich NP 03/10/2022

## 2022-03-10 NOTE — Assessment & Plan Note (Signed)
-   Continue Symbicort 129mg two puffs twice daily and prn Albuterol hfa/neb every 4-6 hours for breakthrough shortness of breath or wheezing

## 2022-03-10 NOTE — Assessment & Plan Note (Addendum)
-   Stable; Wearing 2-2.5L oxygen continuously 24/7. O2 94% on 4L pulsed

## 2022-03-11 MED ORDER — FUROSEMIDE 20 MG PO TABS: ORAL_TABLET | ORAL | 0 refills | Status: AC

## 2022-03-11 NOTE — Addendum Note (Signed)
Addended by: Martyn Ehrich on: 03/11/2022 09:46 AM   Modules accepted: Orders

## 2022-03-12 ENCOUNTER — Encounter (HOSPITAL_COMMUNITY): Payer: Self-pay | Admitting: Emergency Medicine

## 2022-03-12 ENCOUNTER — Inpatient Hospital Stay (HOSPITAL_COMMUNITY)
Admission: EM | Admit: 2022-03-12 | Discharge: 2022-03-31 | DRG: 193 | Disposition: E | Payer: Medicare Other | Source: Skilled Nursing Facility | Attending: Internal Medicine | Admitting: Internal Medicine

## 2022-03-12 ENCOUNTER — Emergency Department (HOSPITAL_COMMUNITY): Payer: Medicare Other

## 2022-03-12 ENCOUNTER — Other Ambulatory Visit: Payer: Self-pay

## 2022-03-12 DIAGNOSIS — Z515 Encounter for palliative care: Secondary | ICD-10-CM | POA: Diagnosis not present

## 2022-03-12 DIAGNOSIS — F419 Anxiety disorder, unspecified: Secondary | ICD-10-CM | POA: Diagnosis present

## 2022-03-12 DIAGNOSIS — Z8711 Personal history of peptic ulcer disease: Secondary | ICD-10-CM

## 2022-03-12 DIAGNOSIS — F32A Depression, unspecified: Secondary | ICD-10-CM | POA: Diagnosis present

## 2022-03-12 DIAGNOSIS — M1990 Unspecified osteoarthritis, unspecified site: Secondary | ICD-10-CM | POA: Diagnosis present

## 2022-03-12 DIAGNOSIS — D72829 Elevated white blood cell count, unspecified: Secondary | ICD-10-CM | POA: Diagnosis present

## 2022-03-12 DIAGNOSIS — G2581 Restless legs syndrome: Secondary | ICD-10-CM | POA: Diagnosis present

## 2022-03-12 DIAGNOSIS — I2510 Atherosclerotic heart disease of native coronary artery without angina pectoris: Secondary | ICD-10-CM | POA: Diagnosis present

## 2022-03-12 DIAGNOSIS — Z7951 Long term (current) use of inhaled steroids: Secondary | ICD-10-CM

## 2022-03-12 DIAGNOSIS — J439 Emphysema, unspecified: Secondary | ICD-10-CM | POA: Diagnosis present

## 2022-03-12 DIAGNOSIS — Z8719 Personal history of other diseases of the digestive system: Secondary | ICD-10-CM

## 2022-03-12 DIAGNOSIS — K219 Gastro-esophageal reflux disease without esophagitis: Secondary | ICD-10-CM | POA: Diagnosis present

## 2022-03-12 DIAGNOSIS — Z7952 Long term (current) use of systemic steroids: Secondary | ICD-10-CM

## 2022-03-12 DIAGNOSIS — J471 Bronchiectasis with (acute) exacerbation: Secondary | ICD-10-CM | POA: Diagnosis present

## 2022-03-12 DIAGNOSIS — E039 Hypothyroidism, unspecified: Secondary | ICD-10-CM

## 2022-03-12 DIAGNOSIS — Z79890 Hormone replacement therapy (postmenopausal): Secondary | ICD-10-CM | POA: Diagnosis not present

## 2022-03-12 DIAGNOSIS — Z91041 Radiographic dye allergy status: Secondary | ICD-10-CM

## 2022-03-12 DIAGNOSIS — J189 Pneumonia, unspecified organism: Secondary | ICD-10-CM | POA: Diagnosis present

## 2022-03-12 DIAGNOSIS — J690 Pneumonitis due to inhalation of food and vomit: Secondary | ICD-10-CM | POA: Diagnosis not present

## 2022-03-12 DIAGNOSIS — J449 Chronic obstructive pulmonary disease, unspecified: Secondary | ICD-10-CM

## 2022-03-12 DIAGNOSIS — Z8249 Family history of ischemic heart disease and other diseases of the circulatory system: Secondary | ICD-10-CM

## 2022-03-12 DIAGNOSIS — J9601 Acute respiratory failure with hypoxia: Secondary | ICD-10-CM

## 2022-03-12 DIAGNOSIS — Z880 Allergy status to penicillin: Secondary | ICD-10-CM

## 2022-03-12 DIAGNOSIS — J470 Bronchiectasis with acute lower respiratory infection: Secondary | ICD-10-CM

## 2022-03-12 DIAGNOSIS — Z955 Presence of coronary angioplasty implant and graft: Secondary | ICD-10-CM

## 2022-03-12 DIAGNOSIS — I7121 Aneurysm of the ascending aorta, without rupture: Secondary | ICD-10-CM | POA: Diagnosis present

## 2022-03-12 DIAGNOSIS — Z6826 Body mass index (BMI) 26.0-26.9, adult: Secondary | ICD-10-CM

## 2022-03-12 DIAGNOSIS — Z7189 Other specified counseling: Secondary | ICD-10-CM | POA: Diagnosis not present

## 2022-03-12 DIAGNOSIS — E440 Moderate protein-calorie malnutrition: Secondary | ICD-10-CM | POA: Diagnosis present

## 2022-03-12 DIAGNOSIS — Z8619 Personal history of other infectious and parasitic diseases: Secondary | ICD-10-CM

## 2022-03-12 DIAGNOSIS — Z9981 Dependence on supplemental oxygen: Secondary | ICD-10-CM

## 2022-03-12 DIAGNOSIS — R000 Tachycardia, unspecified: Secondary | ICD-10-CM | POA: Diagnosis present

## 2022-03-12 DIAGNOSIS — Z86010 Personal history of colonic polyps: Secondary | ICD-10-CM

## 2022-03-12 DIAGNOSIS — J181 Lobar pneumonia, unspecified organism: Secondary | ICD-10-CM | POA: Diagnosis not present

## 2022-03-12 DIAGNOSIS — Z9079 Acquired absence of other genital organ(s): Secondary | ICD-10-CM

## 2022-03-12 DIAGNOSIS — K2270 Barrett's esophagus without dysplasia: Secondary | ICD-10-CM | POA: Diagnosis present

## 2022-03-12 DIAGNOSIS — R52 Pain, unspecified: Secondary | ICD-10-CM

## 2022-03-12 DIAGNOSIS — I959 Hypotension, unspecified: Secondary | ICD-10-CM | POA: Diagnosis present

## 2022-03-12 DIAGNOSIS — K2970 Gastritis, unspecified, without bleeding: Secondary | ICD-10-CM | POA: Diagnosis present

## 2022-03-12 DIAGNOSIS — D638 Anemia in other chronic diseases classified elsewhere: Secondary | ICD-10-CM | POA: Diagnosis present

## 2022-03-12 DIAGNOSIS — T380X5A Adverse effect of glucocorticoids and synthetic analogues, initial encounter: Secondary | ICD-10-CM | POA: Diagnosis present

## 2022-03-12 DIAGNOSIS — Z9841 Cataract extraction status, right eye: Secondary | ICD-10-CM

## 2022-03-12 DIAGNOSIS — Z1152 Encounter for screening for COVID-19: Secondary | ICD-10-CM

## 2022-03-12 DIAGNOSIS — Z66 Do not resuscitate: Secondary | ICD-10-CM

## 2022-03-12 DIAGNOSIS — Z9842 Cataract extraction status, left eye: Secondary | ICD-10-CM

## 2022-03-12 DIAGNOSIS — Z85828 Personal history of other malignant neoplasm of skin: Secondary | ICD-10-CM

## 2022-03-12 DIAGNOSIS — D649 Anemia, unspecified: Secondary | ICD-10-CM

## 2022-03-12 DIAGNOSIS — I4891 Unspecified atrial fibrillation: Secondary | ICD-10-CM | POA: Diagnosis not present

## 2022-03-12 DIAGNOSIS — Z79899 Other long term (current) drug therapy: Secondary | ICD-10-CM

## 2022-03-12 DIAGNOSIS — Z96643 Presence of artificial hip joint, bilateral: Secondary | ICD-10-CM | POA: Diagnosis present

## 2022-03-12 DIAGNOSIS — Z634 Disappearance and death of family member: Secondary | ICD-10-CM

## 2022-03-12 DIAGNOSIS — E876 Hypokalemia: Secondary | ICD-10-CM | POA: Diagnosis present

## 2022-03-12 DIAGNOSIS — I480 Paroxysmal atrial fibrillation: Secondary | ICD-10-CM | POA: Diagnosis present

## 2022-03-12 DIAGNOSIS — I5033 Acute on chronic diastolic (congestive) heart failure: Secondary | ICD-10-CM | POA: Diagnosis present

## 2022-03-12 DIAGNOSIS — Z888 Allergy status to other drugs, medicaments and biological substances status: Secondary | ICD-10-CM

## 2022-03-12 DIAGNOSIS — J9621 Acute and chronic respiratory failure with hypoxia: Secondary | ICD-10-CM | POA: Diagnosis present

## 2022-03-12 DIAGNOSIS — Z85820 Personal history of malignant melanoma of skin: Secondary | ICD-10-CM

## 2022-03-12 DIAGNOSIS — N400 Enlarged prostate without lower urinary tract symptoms: Secondary | ICD-10-CM | POA: Diagnosis present

## 2022-03-12 DIAGNOSIS — R0602 Shortness of breath: Principal | ICD-10-CM | POA: Diagnosis present

## 2022-03-12 DIAGNOSIS — Z86018 Personal history of other benign neoplasm: Secondary | ICD-10-CM

## 2022-03-12 DIAGNOSIS — J479 Bronchiectasis, uncomplicated: Secondary | ICD-10-CM

## 2022-03-12 DIAGNOSIS — Z885 Allergy status to narcotic agent status: Secondary | ICD-10-CM

## 2022-03-12 DIAGNOSIS — M810 Age-related osteoporosis without current pathological fracture: Secondary | ICD-10-CM | POA: Diagnosis present

## 2022-03-12 LAB — CBC
HCT: 32.3 % — ABNORMAL LOW (ref 39.0–52.0)
Hemoglobin: 9.9 g/dL — ABNORMAL LOW (ref 13.0–17.0)
MCH: 28.3 pg (ref 26.0–34.0)
MCHC: 30.7 g/dL (ref 30.0–36.0)
MCV: 92.3 fL (ref 80.0–100.0)
Platelets: 271 10*3/uL (ref 150–400)
RBC: 3.5 MIL/uL — ABNORMAL LOW (ref 4.22–5.81)
RDW: 19.5 % — ABNORMAL HIGH (ref 11.5–15.5)
WBC: 12.3 10*3/uL — ABNORMAL HIGH (ref 4.0–10.5)
nRBC: 0 % (ref 0.0–0.2)

## 2022-03-12 LAB — BASIC METABOLIC PANEL
Anion gap: 9 (ref 5–15)
BUN: 21 mg/dL (ref 8–23)
CO2: 20 mmol/L — ABNORMAL LOW (ref 22–32)
Calcium: 7.7 mg/dL — ABNORMAL LOW (ref 8.9–10.3)
Chloride: 109 mmol/L (ref 98–111)
Creatinine, Ser: 1.16 mg/dL (ref 0.61–1.24)
GFR, Estimated: >60 mL/min (ref >60)
Glucose, Bld: 95 mg/dL (ref 70–99)
Potassium: 3.8 mmol/L (ref 3.5–5.1)
Sodium: 138 mmol/L (ref 135–145)

## 2022-03-12 LAB — RESPIRATORY PANEL BY PCR

## 2022-03-12 LAB — EXPECTORATED SPUTUM ASSESSMENT W GRAM STAIN, RFLX TO RESP C

## 2022-03-12 LAB — TROPONIN I (HIGH SENSITIVITY)
Troponin I (High Sensitivity): 10 ng/L (ref <18)
Troponin I (High Sensitivity): 10 ng/L (ref <18)

## 2022-03-12 LAB — RESP PANEL BY RT-PCR (RSV, FLU A&B, COVID)  RVPGX2
Influenza A by PCR: NEGATIVE
Influenza B by PCR: NEGATIVE
Resp Syncytial Virus by PCR: NEGATIVE
SARS Coronavirus 2 by RT PCR: NEGATIVE

## 2022-03-12 LAB — BRAIN NATRIURETIC PEPTIDE: B Natriuretic Peptide: 463.6 pg/mL — ABNORMAL HIGH (ref 0.0–100.0)

## 2022-03-12 LAB — MRSA NEXT GEN BY PCR, NASAL: MRSA by PCR Next Gen: NOT DETECTED

## 2022-03-12 MED ORDER — METOPROLOL SUCCINATE ER 25 MG PO TB24
50.0000 mg | ORAL_TABLET | Freq: Every day | ORAL | Status: DC
  Administered 2022-03-13 – 2022-03-14 (×2): 50 mg via ORAL
  Filled 2022-03-12: qty 1
  Filled 2022-03-12: qty 2

## 2022-03-12 MED ORDER — METOPROLOL TARTRATE 5 MG/5ML IV SOLN
5.0000 mg | Freq: Once | INTRAVENOUS | Status: AC
  Administered 2022-03-12: 5 mg via INTRAVENOUS
  Filled 2022-03-12: qty 5

## 2022-03-12 MED ORDER — FUROSEMIDE 10 MG/ML IJ SOLN
40.0000 mg | Freq: Once | INTRAMUSCULAR | Status: AC
  Administered 2022-03-12: 40 mg via INTRAVENOUS
  Filled 2022-03-12: qty 4

## 2022-03-12 MED ORDER — FUROSEMIDE 10 MG/ML IJ SOLN
20.0000 mg | Freq: Two times a day (BID) | INTRAMUSCULAR | Status: DC
  Administered 2022-03-13 (×3): 20 mg via INTRAVENOUS
  Filled 2022-03-12: qty 2
  Filled 2022-03-12 (×2): qty 4

## 2022-03-12 MED ORDER — SODIUM CHLORIDE 0.9 % IV SOLN
2.0000 g | Freq: Two times a day (BID) | INTRAVENOUS | Status: DC
  Administered 2022-03-12 – 2022-03-19 (×14): 2 g via INTRAVENOUS
  Filled 2022-03-12 (×14): qty 12.5

## 2022-03-12 MED ORDER — SUCRALFATE 1 G PO TABS
1.0000 g | ORAL_TABLET | Freq: Three times a day (TID) | ORAL | Status: DC
  Administered 2022-03-13 – 2022-03-19 (×21): 1 g via ORAL
  Filled 2022-03-12 (×21): qty 1

## 2022-03-12 MED ORDER — IOHEXOL 350 MG/ML SOLN
100.0000 mL | Freq: Once | INTRAVENOUS | Status: AC | PRN
  Administered 2022-03-12: 100 mL via INTRAVENOUS

## 2022-03-12 MED ORDER — VANCOMYCIN HCL IN DEXTROSE 1-5 GM/200ML-% IV SOLN
1000.0000 mg | INTRAVENOUS | Status: DC
  Administered 2022-03-12 – 2022-03-13 (×2): 1000 mg via INTRAVENOUS
  Filled 2022-03-12 (×2): qty 200

## 2022-03-12 MED ORDER — METOPROLOL TARTRATE 25 MG PO TABS
25.0000 mg | ORAL_TABLET | Freq: Once | ORAL | Status: AC
  Administered 2022-03-12: 25 mg via ORAL
  Filled 2022-03-12: qty 1

## 2022-03-12 MED ORDER — ENOXAPARIN SODIUM 40 MG/0.4ML IJ SOSY
40.0000 mg | PREFILLED_SYRINGE | INTRAMUSCULAR | Status: DC
  Administered 2022-03-13 – 2022-03-18 (×7): 40 mg via SUBCUTANEOUS
  Filled 2022-03-12 (×7): qty 0.4

## 2022-03-12 MED ORDER — LEVOTHYROXINE SODIUM 75 MCG PO TABS
75.0000 ug | ORAL_TABLET | Freq: Every day | ORAL | Status: DC
  Administered 2022-03-13 – 2022-03-19 (×6): 75 ug via ORAL
  Filled 2022-03-12 (×6): qty 1

## 2022-03-12 MED ORDER — TAMSULOSIN HCL 0.4 MG PO CAPS
0.4000 mg | ORAL_CAPSULE | Freq: Every day | ORAL | Status: DC
  Administered 2022-03-13: 0.4 mg via ORAL
  Filled 2022-03-12: qty 1

## 2022-03-12 MED ORDER — MOMETASONE FURO-FORMOTEROL FUM 200-5 MCG/ACT IN AERO: 2.0000 | INHALATION_SPRAY | Freq: Two times a day (BID) | RESPIRATORY_TRACT | Status: DC

## 2022-03-12 MED ORDER — IPRATROPIUM BROMIDE 0.02 % IN SOLN
0.5000 mg | Freq: Four times a day (QID) | RESPIRATORY_TRACT | Status: DC | PRN
  Administered 2022-03-15: 0.5 mg via RESPIRATORY_TRACT
  Filled 2022-03-12: qty 2.5

## 2022-03-12 MED ORDER — PANTOPRAZOLE SODIUM 40 MG PO TBEC
40.0000 mg | DELAYED_RELEASE_TABLET | Freq: Two times a day (BID) | ORAL | Status: DC
  Administered 2022-03-13 – 2022-03-15 (×6): 40 mg via ORAL
  Filled 2022-03-12 (×6): qty 1

## 2022-03-12 MED ORDER — ALBUTEROL SULFATE (5 MG/ML) 0.5% IN NEBU: 2.5000 mg | INHALATION_SOLUTION | Freq: Four times a day (QID) | RESPIRATORY_TRACT | Status: DC | PRN

## 2022-03-12 MED ORDER — PSYLLIUM 95 % PO PACK
1.0000 | PACK | Freq: Every day | ORAL | Status: DC
  Administered 2022-03-13 – 2022-03-15 (×3): 1 via ORAL
  Filled 2022-03-12 (×4): qty 1

## 2022-03-12 NOTE — ED Provider Notes (Signed)
Accepted handoff at shift change from [facility]. Please see prior provider note for more detail.   Briefly: Patient is 89 y.o.   DDX: concern for tachycardia, new oxygen requirement, cough, shortness of breath, pink frothy sputum. Patient pending admisison with completed workup at time of my handoff. I spoke with the hospitalist, spoke with Dr. Karleen Hampshire who after discussion of relevant data, imaging, labwork, agrees to admission for this patient.   Plan: admit as discussed extensively in previous PA plan and note.      RISR  EDTHIS    Anselmo Pickler, PA-C 03/23/2022 [place], MD 03/13/22 1335

## 2022-03-12 NOTE — ED Triage Notes (Signed)
89 yo male biba for hematemesis. Pt has Hx of barretts esophagus. Pt at this time has no complaints aside from the bleeding. Pt is Aox4 per EMS.  Initial V/S: Bp 98/70 post 300cc of ns 120/86 Hr 110-125 pt has hx of afib pt has not take any morning meds yet Spo2 94% on 6L pt baseline on 4L at home RR 30 (pt sob when talking but reports this is baseline per EMS) Cbg 199

## 2022-03-12 NOTE — ED Notes (Signed)
Pt transported to radiology.

## 2022-03-12 NOTE — ED Provider Notes (Signed)
[facility] DEPT Provider Note   CSN: [id number] Arrival date & time: 03/19/2022  1113     History No chief complaint on file.   [name] is a 89 y.o. male with medical history of anemia, arthritis, asthma, atrial fibrillation not anticoagulated, Barrett's esophagus, COPD, emphysema, GERD.  Patient presents to ED for evaluation of hemoptysis.  Patient reports that for the last 2 to 3 weeks he has had persistent hemoptysis.  Patient reports that he has had a nagging cough for the last 2 to 3 weeks as well.  The patient is unsure if the cough began around the same time as the hemoptysis.  Patient has history of COPD and currently wears 3 L of oxygen at home on baseline.  The patient reports he has had to increase this recently due to low oxygen saturation readings.  Patient also reports that he did not take his atrial fibrillation medication this morning.  Patient on the monitor in the room has a heart rate that is bouncing between 110 to 130 bpm and is shown to be in A-fib.  Patient unsure what medications he is post to be taking for his A-fib.  Patient was seen by his pulmonologist 2 days ago, did not mention hemoptysis to her, he is unsure why.  Patient endorsing shortness of breath, "a twinge" of chest pain 3 days ago, leg swelling.  Patient denies fevers, body aches or chills, sore throat, abdominal pain, nausea, vomiting, diarrhea.  HPI     Home Medications Prior to Admission medications   Medication Sig Start Date End Date Taking? Authorizing Provider  albuterol (PROVENTIL) (2.5 MG/3ML) 0.083% nebulizer solution Take 3 mLs (2.5 mg total) by nebulization every 4 (four) hours as needed for wheezing or shortness of breath. 03/10/22   Martyn Ehrich, NP  Ascorbic Acid (VITAMIN C) 500 MG CAPS Take 1 capsule by mouth daily.    [provider]  budesonide-formoterol (SYMBICORT) 160-4.5 MCG/ACT inhaler Inhale 2 puffs into the lungs in the morning and  at bedtime. 03/05/21   Margaretha Seeds, MD  cabergoline (DOSTINEX) 0.5 MG tablet Take 1 tablet (0.5 mg total) by mouth 2 (two) times a week. 01/13/22   Cassandria Anger, MD  calcium carbonate (OS-CAL - DOSED IN MG OF ELEMENTAL CALCIUM) 1250 (500 Ca) MG tablet Take 1 tablet by mouth daily with breakfast.    [provider]  cholecalciferol (VITAMIN D) 1000 UNITS tablet Take 1,000 Units by mouth daily.     [provider]  dextromethorphan 15 MG/5ML syrup Take 10 mLs (30 mg total) by mouth 4 (four) times daily as needed for cough. Patient not taking: Reported on 03/10/2022 02/08/22   Patrecia Pour, MD  dextromethorphan-guaiFENesin [facility] DM) 30-600 MG 12hr tablet Take 1 tablet by mouth 2 (two) times daily. 03/10/22   Martyn Ehrich, NP  diclofenac Sodium (VOLTAREN) 1 % GEL Apply 2 g topically 4 (four) times daily. Patient not taking: Reported on 03/10/2022 07/14/21   Dettinger, [name], MD  diphenhydrAMINE (BENADRYL) 25 MG tablet Take 25 mg by mouth every 6 (six) hours as needed for itching. Patient not taking: Reported on 03/10/2022    [provider]  diphenhydramine-acetaminophen (TYLENOL PM) 25-500 MG TABS tablet Take 1/2 tablet by mouth at bedtime as needed for sleep Patient not taking: Reported on 03/10/2022    [provider]  FEROSUL 325 (65 Fe) MG tablet TAKE (1) TABLET DAILY WITH BREAKFAST. Patient not taking: Reported  on 03/10/2022 02/10/21   Dettinger, [name], MD  furosemide (LASIX) 20 MG tablet Take 1 tablet daily x 5 days 03/11/22   Martyn Ehrich, NP  GAVILAX 17 GM/SCOOP powder DISSOLVE 17 GM SCOOP IN 8 OZ. OF WATER AND DRINK ONCE DAILY AS NEEDED FOR CONSTIPATION Patient not taking: Reported on 03/10/2022 06/29/20   Jerene Bears, MD  levalbuterol [facility] HFA) 45 MCG/ACT inhaler Inhale 1 puff into the lungs every 4 (four) hours as needed for wheezing. 12/06/21 12/06/22  Terrilee Croak, MD  levofloxacin (LEVAQUIN) 500 MG tablet Take 1 tablet  (500 mg total) by mouth daily. 03/10/22   Martyn Ehrich, NP  levothyroxine (SYNTHROID) 75 MCG tablet Take 1 tablet (75 mcg total) by mouth daily before breakfast. 12/01/21   Dettinger, [name], MD  magnesium oxide (MAG-OX) 400 (241.3 Mg) MG tablet Take 1 tablet (400 mg total) by mouth 2 (two) times daily. Patient not taking: Reported on 03/10/2022 06/13/20   Cristal Deer, MD  Melatonin 5 MG CAPS Take 1 capsule by mouth at bedtime. Patient not taking: Reported on 03/10/2022    [provider]  metoprolol succinate (TOPROL-XL) 50 MG 24 hr tablet Take 1 tablet (50 mg total) by mouth daily. Take with or immediately following a meal. 02/15/22   Hassell Done, Mary-Margaret, FNP  naphazoline-pheniramine (NAPHCON-A) 0.025-0.3 % ophthalmic solution Place 1 drop into the left eye 2 (two) times daily as needed for irritation or allergies. Patient not taking: Reported on 03/10/2022    [provider]  nitroGLYCERIN (NITROSTAT) 0.4 MG SL tablet Place 1 tablet (0.4 mg total) under the tongue every 5 (five) minutes as needed for chest pain. Patient not taking: Reported on 03/10/2022 07/14/21   Dettinger, [name], MD  pantoprazole (PROTONIX) 40 MG tablet Take 1 tablet (40 mg total) by mouth 2 (two) times daily. 12/06/21 03/06/22  Terrilee Croak, MD  predniSONE (DELTASONE) 20 MG tablet Take 2 tablets (40 mg total) by mouth daily with breakfast. 03/10/22   Martyn Ehrich, NP  psyllium (METAMUCIL) 28 % packet Take 1 packet by mouth daily. 06/07/21   Gwenlyn Perking, FNP  Respiratory Therapy Supplies (FLUTTER) DEVI Twice a day and prn as needed, may increase if feeling worse 05/01/18   Martyn Ehrich, NP  sodium chloride HYPERTONIC 3 % nebulizer solution Take by nebulization as needed for other. 03/10/22   Martyn Ehrich, NP  sucralfate (CARAFATE) 1 g tablet Take 1 tablet (1 g total) by mouth 4 (four) times daily -  with meals and at bedtime. 12/06/21 03/10/22  Terrilee Croak, MD  tamsulosin (FLOMAX)  0.4 MG CAPS capsule Take 1 capsule (0.4 mg total) by mouth daily after supper. Patient not taking: Reported on 03/10/2022 07/14/21   Dettinger, [name], MD  traMADol (ULTRAM) 50 MG tablet Take 1 tablet (50 mg total) by mouth 2 (two) times daily as needed. Patient not taking: Reported on 03/10/2022 09/01/21   Aundra Dubin, PA-C  Turmeric 500 MG CAPS Take 1 capsule by mouth daily. Patient not taking: Reported on 03/10/2022    [provider]  vitamin B-12 (CYANOCOBALAMIN) 1000 MCG tablet Take 1,000 mcg by mouth daily. Patient not taking: Reported on 03/10/2022    [provider]  vitamin E 180 MG (400 UNITS) capsule Take 400 Units by mouth daily. Patient not taking: Reported on 03/10/2022    [provider]      Allergies    Gadavist [gadobutrol], Prednisone, Betadine [povidone iodine], Lortab [  hydrocodone-acetaminophen], Penicillins, Zetia [ezetimibe], and Zocor [simvastatin]    Review of Systems   Review of Systems  Constitutional:  Negative for fever.  HENT:  Negative for sore throat.   Respiratory:  Positive for shortness of breath.   Cardiovascular:  Positive for chest pain and leg swelling.  Gastrointestinal:  Negative for abdominal pain, diarrhea, nausea and vomiting.  All other systems reviewed and are negative.   Physical Exam Updated Vital Signs BP 124/82   Pulse (!) 110   Temp 98.2 F (36.8 C) (Oral)   Resp (!) 40   SpO2 91%  Physical Exam Vitals and nursing note reviewed.  Constitutional:      General: He is not in acute distress.    Appearance: Normal appearance. He is not ill-appearing, toxic-appearing or diaphoretic.  HENT:     Head: Normocephalic and atraumatic.     Nose: Nose normal. No congestion.     Mouth/Throat:     Mouth: Mucous membranes are moist.     Pharynx: Oropharynx is clear.  Eyes:     Extraocular Movements: Extraocular movements intact.     Conjunctiva/sclera: Conjunctivae normal.     Pupils: Pupils are equal, round,  and reactive to light.  Cardiovascular:     Rate and Rhythm: Normal rate and regular rhythm.  Pulmonary:     Effort: Pulmonary effort is normal.  Abdominal:     General: Abdomen is flat. Bowel sounds are normal.     Palpations: Abdomen is soft.     Tenderness: There is no abdominal tenderness.  Musculoskeletal:     Cervical back: Normal range of motion and neck supple. No tenderness.     Right lower leg: Edema present.     Left lower leg: Edema present.  Skin:    General: Skin is warm and dry.     Capillary Refill: Capillary refill takes less than 2 seconds.  Neurological:     Mental Status: He is alert and oriented to person, place, and time.     ED Results / Procedures / Treatments   Labs (all labs ordered are listed, but only abnormal results are displayed) Labs Reviewed  CBC - Abnormal; Notable for the following components:      Result Value   WBC 12.3 (*)    RBC 3.50 (*)    Hemoglobin 9.9 (*)    HCT 32.3 (*)    RDW 19.5 (*)    All other components within normal limits  BASIC METABOLIC PANEL - Abnormal; Notable for the following components:   CO2 20 (*)    Calcium 7.7 (*)    All other components within normal limits  BRAIN NATRIURETIC PEPTIDE - Abnormal; Notable for the following components:   B Natriuretic Peptide 463.6 (*)    All other components within normal limits  TYPE AND SCREEN  TROPONIN I (HIGH SENSITIVITY)  TROPONIN I (HIGH SENSITIVITY)    EKG EKG Interpretation  Date/Time:  Saturday March 12 2022 11:29:05 EST Ventricular Rate:  114 PR Interval:    QRS Duration: 100 QT Interval:  338 QTC Calculation: 466 R Axis:   88 Text Interpretation: Atrial fibrillation Borderline right axis deviation No significant change since last tracing Confirmed by Blanchie Dessert [phone]) on 03/11/2022 12:13:38 PM  Radiology CT Angio Chest PE W and/or Wo Contrast  Result Date: 03/17/2022 CLINICAL DATA:  Pulmonary embolism suspected, high probability. Coughing  blood. EXAM: CT ANGIOGRAPHY CHEST WITH CONTRAST TECHNIQUE: Multidetector CT imaging of the chest was performed using the standard  protocol during bolus administration of intravenous contrast. Multiplanar CT image reconstructions and MIPs were obtained to evaluate the vascular anatomy. RADIATION DOSE REDUCTION: This exam was performed according to the departmental dose-optimization program which includes automated exposure control, adjustment of the mA and/or kV according to patient size and/or use of iterative reconstruction technique. CONTRAST:  1104m OMNIPAQUE IOHEXOL 350 MG/ML SOLN COMPARISON:  Chest CT dated 09/30/2021. FINDINGS: Cardiovascular: Study is limited by patient motion artifact, however, there is no pulmonary embolism identified within the main, lobar or central segmental pulmonary arteries bilaterally. No pericardial effusion. Aortic atherosclerosis. Three-vessel coronary artery calcifications. Mediastinum/Nodes: No mass or enlarged lymph nodes are identified within the mediastinum. Scattered small and moderate-sized lymph nodes are likely reactive in nature. Lungs/Pleura: Diffuse bilateral airspace consolidations and ground-glass opacities, most confluence at the bilateral lung bases and within the LEFT upper lobe. Small bilateral pleural effusions. No pneumothorax. Upper Abdomen: Limited images of the upper abdomen are unremarkable. Musculoskeletal: No acute findings. Chronic compression fracture deformity of a midthoracic vertebral body. Review of the MIP images confirms the above findings. IMPRESSION: 1. Diffuse bilateral airspace consolidations and ground-glass opacities, most confluence at the bilateral lung bases and within the LEFT upper lobe, compatible with multifocal pneumonia, most likely atypical/viral. 2. Small bilateral pleural effusions. 3. No pulmonary embolism identified, with mild study limitations detailed above. 4. Three-vessel coronary artery calcifications. Aortic  Atherosclerosis (ICD10-I70.0). Electronically Signed   By: SFranki CabotM.D.   On: 03/02/2022 14:17   DG Chest 2 View  Result Date: 03/24/2022 CLINICAL DATA:  hematemesis, sob, leg swelling EXAM: CHEST - 2 VIEW COMPARISON:  March 10, 2022 FINDINGS: The cardiomediastinal silhouette is unchanged in contour. Small bilateral pleural effusions. No pneumothorax. Continued increased confluent bilateral peripheral pulmonary opacities. There are increasing diffuse reticular opacities in comparison to prior superimposed on a background of emphysema. Visualized abdomen is unremarkable. Multilevel degenerative changes of the thoracic spine. IMPRESSION: 1. Increasing diffuse reticular opacities and confluent peripheral pulmonary opacities. Differential considerations include pulmonary edema and or multifocal infection. Recommend follow-up to resolution. 2. Small bilateral pleural effusions. Electronically Signed   By: SValentino SaxonM.D.   On: 03/22/2022 12:29    Procedures Procedures    Medications Ordered in ED Medications  metoprolol tartrate (LOPRESSOR) tablet 25 mg (has no administration in time range)  metoprolol tartrate (LOPRESSOR) injection 5 mg (has no administration in time range)  iohexol (OMNIPAQUE) 350 MG/ML injection 100 mL (100 mLs Intravenous Contrast Given 03/25/2022 1355)  furosemide (LASIX) injection 40 mg (40 mg Intravenous Given 03/11/2022 1458)    ED Course/ Medical Decision Making/ A&P Clinical Course as of 03/11/2022 1517  Sat Mar 12, 2022  1426 89YOM with history of afib not anticoagulated, arthritis, asthma, barretts esophagus, copd on 3LPM of oxygen, GERD. Want to admit this patient for diuresis as well as elevated BNP. Recently hospitalized one month ago secondary to RSV and has had persistent cough since then. Came back in today due to shortness of breath, has had to increase his oxygen to 6LPM and he's satting 95%. Patient in afib RVR but not anticoagulated. Chest xray was  inconclusive so we went to CTA which does show ground glass opacities but he denies any fever or new cough since RSV so possibly secondary to RSV infection. Will defer to you if you want to treat for CAP. CTA also shows pleural effusions. Patient does have new onset bilateral edema which he states has developed in the last 2 weeks, does not take fluid  pill per patient and son. CBC with leukocytosis to 12.3, hemoglobin stable at 9.9. BNP 463.6. Troponin ___. BMP ___. I have given the patient 5 of IV metoprolol and 25 of IV metoprolol for rate control.  [CG]    Clinical Course User Index [CG] Lawana Chambers  }                          Medical Decision Making Amount and/or Complexity of Data Reviewed Labs: ordered. Radiology: ordered.   89 year old presents to ED for evaluation.  Please see HPI for further details.  On examination patient afebrile, in A-fib on the monitor with a rapid rate.  Patient lung sounds are distant, wheezy.  Patient abdomen soft and compressible.  Neurologic status at baseline, oriented x 3.  Patient does have bilateral edema.  BMP unremarkable.  Patient CBC with leukocytosis 12.3, decreased hemoglobin 9.9.  Patient BNP elevated at 463.  Patient troponin at 10.  Delta troponin pending.  Patient chest x-ray shows questionable infection.  CTA shows bilateral groundglass opacities however patient does state he had RSV 1 month ago.  The patient denies any new cough, fever.    Patient provided 5 mg IV metoprolol for rate control, 25 mg IV metoprolol.  At end my shift, patient workup not complete.  Patient signed out to oncoming team.  Plan of management discussed with oncoming team.  Final Clinical Impression(s) / ED Diagnoses Final diagnoses:  Shortness of breath    Rx / DC Orders ED Discharge Orders     None         Azucena Cecil, PA-C 03/29/2022 1518    Blanchie Dessert, MD 03/13/22 0710

## 2022-03-12 NOTE — Progress Notes (Signed)
Pharmacy Antibiotic Note  [name] is a 89 y.o. male admitted on 03/17/2022 with multifocal PNA.  Pharmacy has been consulted for vancomycin and cefepime dosing.  Plan: Cefepime 2g IV q12h Vancomycin 1g IV q24h for estimated AUC 482 using SCr 1.16, Vd 0.72 Check vancomycin trough at steady state, goal AUC 400-550 Follow up renal function & cultures    Temp (24hrs), Avg:98.2 F (36.8 C), Min:98.1 F (36.7 C), Max:98.2 F (36.8 C)  Recent Labs  Lab 03/10/22 0956 03/28/2022 1147  WBC 11.9* 12.3*  CREATININE 1.12 1.16    Estimated Creatinine Clearance: 40.5 mL/min (by C-G formula based on SCr of 1.16 mg/dL).    Allergies  Allergen Reactions   Gadavist [Gadobutrol] Hives   Prednisone Other (See Comments)    Agitation and hallucinations   Betadine [Povidone Iodine] Other (See Comments)    Blisters    Lortab [Hydrocodone-Acetaminophen] Nausea And Vomiting   Penicillins Other (See Comments)    Lightheadedness  Has patient had a PCN reaction causing immediate rash, facial/tongue/throat swelling, SOB or lightheadedness with hypotension: Yes Has patient had a PCN reaction causing severe rash involving mucus membranes or skin necrosis: No Has patient had a PCN reaction that required hospitalization No Has patient had a PCN reaction occurring within the last 10 years: No If all of the above answers are "NO", then may proceed with Cephalosporin use.   Zetia [Ezetimibe] Other (See Comments)    Muscle weakness    Zocor [Simvastatin] Nausea Only and Other (See Comments)    Muscle weakness     Antimicrobials this admission: 1/13 Vanc >> 1/13 Cefepime >>  Dose adjustments this admission:  Microbiology results: 1/13 MRSA PCR:  Thank you for allowing pharmacy to be a part of this patient's care.  Peggyann Juba, PharmD, BCPS Pharmacy: [phone] 03/11/2022 5:38 PM

## 2022-03-12 NOTE — H&P (Signed)
History and Physical    Patient: [name] HYQ:[id number] DOB: 1932/08/12 DOA: 03/02/2022 DOS: the patient was seen and examined on 03/30/2022 PCP: Dettinger, [name], MD  Patient coming from: ALF/ILF  Chief Complaint: sob since one week. HPI: [name] is a 89 y.o. male with medical history significant of Ascending aorta aneurysm, atrial fibrillation, CAD, GERD, COPD, depression, presents to ED for sob , blood tinged  productive sputum since few weeks. He denies fever or chills. He denies any chest pain. He denies any nausea and vomiting. He denies any syncope. Patient is a poor historian and no family at bedside. He was recently admitted for RSV bronchitis, and discharged to ALF. He is chronically on  4 lit of [place] oxygen. H epresents this morning for sob and an episode of hemoptysis. He also reports worsening pedal edema. On arrival to ED, he was afebrile, tachycardic and dyspneic with RR is 28/min and normotensive.   Labs revealed elevated BNP at 463, troponin s negative. Hemoglobin at 9.9. CT angio of the chest showed  Diffuse bilateral airspace consolidations and ground-glass opacities, most confluence at the bilateral lung bases and within the LEFT upper lobe, compatible with multifocal pneumonia, most likely atypical/viral..   Review of Systems: As mentioned in the history of present illness. All other systems reviewed and are negative. Past Medical History:  Diagnosis Date   Abnormality of gait 06/07/2013   Anemia    Aneurysm ([place])    on assending aorta, currently watching it, Dr Cyndia Bent   Anxiety    Arthritis    Asthma    Atrial fibrillation ([place])    B12 deficiency    Barrett's esophagus    CAD (coronary artery disease) 03/2006   3.0 x 20 mm TAXUS Perseus DES to the LAD; 01/2007  L main 30%, oLAD 50%, pLAD stent ok, CFX 80%, OM 60%, pRCA 60%, mRCA 70%, oPDA 90%; med rx    Cancer ([place])    skin cancer    Cataract    bilateral removal of cateracts   COLONIC POLYPS  07/17/2008   Qualifier: Diagnosis of  By: Lovette Cliche, CNA, Christy     Complication of anesthesia     no issues,but pt prefers spinal due to Pulmonary problems   COPD (chronic obstructive pulmonary disease) ([place])    oxygen  on standby in home.   Depression    Diverticulosis    Emphysema    Enlarged prostate with urinary retention    Esophageal stenosis    Gastric ulcer    GERD (gastroesophageal reflux disease)    Hiatal hernia    Hypothyroidism    Neuropathy    Osteoporosis    Pituitary macroadenoma ([facility])    Restless legs syndrome (RLS)    Tubular adenoma of colon    UGIB (upper gastrointestinal bleed) 03/2013   EGD w/ large ulcer at GE junction   Past Surgical History:  Procedure Laterality Date   ABDOMINAL HERNIA REPAIR   2008   BIOPSY  06/13/2020   Procedure: BIOPSY;  Surgeon: Harvel Quale, MD;  [facility];  Service: Gastroenterology;;  gastric   BIOPSY  12/04/2021   Procedure: BIOPSY;  Surgeon: Juanita Craver, MD;  [facility];  Service: Gastroenterology;;   CARDIAC CATHETERIZATION  01/2007   L main 30%, oLAD 50%,  pLAD stent ok, CFX 80%, OM 60%, pRCA 60%, mRCA 70%, oPDA 90%; med rx   cataract extraction both eyes     COLONOSCOPY  COLONOSCOPY WITH PROPOFOL N/A 06/13/2020   Procedure: COLONOSCOPY WITH PROPOFOL;  Surgeon: Harvel Quale, MD;  [facility];  Service: Gastroenterology;  Laterality: N/A;   COLONOSCOPY WITH PROPOFOL N/A 12/04/2021   Procedure: COLONOSCOPY WITH PROPOFOL;  Surgeon: Juanita Craver, MD;  [facility];  Service: Gastroenterology;  Laterality: N/A;   CORONARY ANGIOPLASTY     CORONARY STENT PLACEMENT  03/2006   3.0 x 20 mm TAXUS Perseus DES to the LAD   CYSTOSCOPY N/A 06/10/2015   Procedure: CYSTOSCOPY FULGRATION OF BLEEDING,  electovapor resection;  Surgeon: Irine Seal, MD;  [facility];  Service: Urology;  Laterality: N/A;   CYSTOSCOPY WITH INSERTION OF UROLIFT N/A 06/01/2015    Procedure: CYSTOSCOPY WITH INSERTION OF UROLIFT x4;  Surgeon: Irine Seal, MD;  [facility];  Service: Urology;  Laterality: N/A;   ESOPHAGOGASTRODUODENOSCOPY N/A 04/20/2013   Procedure: ESOPHAGOGASTRODUODENOSCOPY (EGD);  Surgeon: Inda Castle, MD;  [facility];  Service: Endoscopy;  Laterality: N/A;   ESOPHAGOGASTRODUODENOSCOPY N/A 03/25/2016   Procedure: ESOPHAGOGASTRODUODENOSCOPY (EGD);  Surgeon: Irene Shipper, MD;  [facility];  Service: Endoscopy;  Laterality: N/A;   ESOPHAGOGASTRODUODENOSCOPY (EGD) WITH PROPOFOL N/A 10/13/2015   Procedure: ESOPHAGOGASTRODUODENOSCOPY (EGD) WITH PROPOFOL;  Surgeon: Jerene Bears, MD;  [facility];  Service: Gastroenterology;  Laterality: N/A;   ESOPHAGOGASTRODUODENOSCOPY (EGD) WITH PROPOFOL N/A 06/13/2020   Procedure: ESOPHAGOGASTRODUODENOSCOPY (EGD) WITH PROPOFOL;  Surgeon: Harvel Quale, MD;  [facility];  Service: Gastroenterology;  Laterality: N/A;   ESOPHAGOGASTRODUODENOSCOPY (EGD) WITH PROPOFOL N/A 10/02/2021   Procedure: ESOPHAGOGASTRODUODENOSCOPY (EGD) WITH PROPOFOL;  Surgeon: Daryel November, MD;  [facility];  Service: Gastroenterology;  Laterality: N/A;   ESOPHAGOGASTRODUODENOSCOPY (EGD) WITH PROPOFOL N/A 12/03/2021   Procedure: ESOPHAGOGASTRODUODENOSCOPY (EGD) WITH PROPOFOL;  Surgeon: Ladene Artist, MD;  [facility];  Service: Gastroenterology;  Laterality: N/A;   FOOT SURGERY  1994 left, 2002 right foot   bilateral foot reconstruciton   FOOT SURGERY     reconstruction of both feet- no retained hardware.   HERNIA REPAIR     HIATAL HERNIA REPAIR  01-04-2008   HOT HEMOSTASIS N/A 10/02/2021   Procedure: HOT HEMOSTASIS (ARGON PLASMA COAGULATION/BICAP);  Surgeon: Daryel November, MD;  [facility];  Service: Gastroenterology;  Laterality: N/A;   JOINT REPLACEMENT     MELANOMA EXCISION  2019   POLYPECTOMY     POLYPECTOMY  06/13/2020   Procedure:  POLYPECTOMY;  Surgeon: Harvel Quale, MD;  [facility];  Service: Gastroenterology;;  ascending,    POLYPECTOMY  12/04/2021   Procedure: POLYPECTOMY;  Surgeon: Juanita Craver, MD;  [facility];  Service: Gastroenterology;;   PROSTATE SURGERY     x 2   SAVORY DILATION N/A 03/25/2016   Procedure: SAVORY DILATION;  Surgeon: Irene Shipper, MD;  [facility];  Service: Endoscopy;  Laterality: N/A;   TOTAL HIP ARTHROPLASTY  07/21/2011   Procedure: TOTAL HIP ARTHROPLASTY ANTERIOR APPROACH;  Surgeon: Mauri Pole, MD;  [facility];  Service: Orthopedics;  Laterality: Left;   TOTAL HIP ARTHROPLASTY Right 09/07/2012   Procedure: RIGHT TOTAL HIP ARTHROPLASTY ANTERIOR APPROACH;  Surgeon: Mcarthur Rossetti, MD;  [facility];  Service: Orthopedics;  Laterality: Right;   TRANSURETHRAL RESECTION OF BLADDER NECK N/A 11/04/2015   Procedure: RESECTION OF BLADDER NECK;  Surgeon: Cleon Gustin, MD;  [facility];  Service: Urology;  Laterality: N/A;   TRANSURETHRAL RESECTION OF PROSTATE N/A 11/04/2015  Procedure: TRANSURETHRAL RESECTION OF THE PROSTATE (TURP); REMOVAL OF UROLIFT IMPLANTS X THREE;  Surgeon: Cleon Gustin, MD;  [facility];  Service: Urology;  Laterality: N/A;   UPPER GASTROINTESTINAL ENDOSCOPY  12/2013   Dr Hilarie Fredrickson, gastritis   VIDEO BRONCHOSCOPY Bilateral 02/01/2017   Procedure: VIDEO BRONCHOSCOPY WITHOUT FLUORO;  Surgeon: Tanda Rockers, MD;  [facility];  Service: Cardiopulmonary;  Laterality: Bilateral;   Social History:  reports that he has never smoked. He has never used smokeless tobacco. He reports that he does not drink alcohol and does not use drugs.  Allergies  Allergen Reactions   Gadavist [Gadobutrol] Hives   Prednisone Other (See Comments)    Agitation and hallucinations   Betadine [Povidone Iodine] Other (See Comments)    Blisters    Lortab [Hydrocodone-Acetaminophen] Nausea And Vomiting    Penicillins Other (See Comments)    Lightheadedness  Has patient had a PCN reaction causing immediate rash, facial/tongue/throat swelling, SOB or lightheadedness with hypotension: Yes Has patient had a PCN reaction causing severe rash involving mucus membranes or skin necrosis: No Has patient had a PCN reaction that required hospitalization No Has patient had a PCN reaction occurring within the last 10 years: No If all of the above answers are "NO", then may proceed with Cephalosporin use.   Zetia [Ezetimibe] Other (See Comments)    Muscle weakness    Zocor [Simvastatin] Nausea Only and Other (See Comments)    Muscle weakness     Family History  Problem Relation Age of Onset   Heart disease Sister    Heart failure Father    Other Mother        phebitis related to Danen's birth, he was 74 weeks old when she died   Colon cancer Neg Hx    Esophageal cancer Neg Hx    Rectal cancer Neg Hx    Stomach cancer Neg Hx    Colon polyps Neg Hx     Prior to Admission medications   Medication Sig Start Date End Date Taking? Authorizing Provider  albuterol (PROVENTIL) (2.5 MG/3ML) 0.083% nebulizer solution Take 3 mLs (2.5 mg total) by nebulization every 4 (four) hours as needed for wheezing or shortness of breath. 03/10/22  Yes Martyn Ehrich, NP  budesonide-formoterol [facility] ([facility])) 160-4.5 MCG/ACT inhaler Inhale 2 puffs into the lungs in the morning and at bedtime. 03/05/21  Yes Margaretha Seeds, MD  cabergoline (DOSTINEX) 0.5 MG tablet Take 1 tablet (0.5 mg total) by mouth 2 (two) times a week. 01/13/22  Yes Nida, Marella Chimes, MD  dextromethorphan-guaiFENesin (MUCINEX DM) 30-600 MG 12hr tablet Take 1 tablet by mouth 2 (two) times daily. 03/10/22  Yes Martyn Ehrich, NP  furosemide (LASIX) 20 MG tablet Take 1 tablet daily x 5 days Patient taking differently: Take 20 mg by mouth daily. Start date 03/10/2022- Take 20 mg by mouth once a day for 5 days 03/11/22  Yes Martyn Ehrich, NP  GAVILAX  17 GM/SCOOP powder DISSOLVE 17 GM SCOOP IN 8 OZ. OF WATER AND DRINK ONCE DAILY AS NEEDED FOR CONSTIPATION Patient taking differently: Take 17 g by mouth daily as needed for mild constipation (mixed into 4-8 ounces of water). 06/29/20  Yes Pyrtle, Lajuan Lines, MD  levalbuterol [facility] HFA) 45 MCG/ACT inhaler Inhale 1 puff into the lungs every 4 (four) hours as needed for wheezing. 12/06/21 12/06/22 Yes Dahal, Marlowe Aschoff, MD  levofloxacin (LEVAQUIN) 500 MG tablet Take 1 tablet (500 mg total) by mouth daily. Patient taking  differently: Take 500 mg by mouth See admin instructions. Start date 03/11/2022- Take 500 mg by mouth once a day for 10 days 03/10/22  Yes Martyn Ehrich, NP  levothyroxine (SYNTHROID) 75 MCG tablet Take 1 tablet (75 mcg total) by mouth daily before breakfast. 12/01/21  Yes Dettinger, [name], MD  metoprolol succinate (TOPROL-XL) 50 MG 24 hr tablet Take 1 tablet (50 mg total) by mouth daily. Take with or immediately following a meal. 02/15/22  Yes Hassell Done, Mary-Margaret, FNP  pantoprazole (PROTONIX) 40 MG tablet Take 1 tablet (40 mg total) by mouth 2 (two) times daily. 12/06/21 03/06/2022 Yes Dahal, Marlowe Aschoff, MD  predniSONE (DELTASONE) 20 MG tablet Take 2 tablets (40 mg total) by mouth daily with breakfast. Patient taking differently: Take 40 mg by mouth See admin instructions. Start date 03/19/2022- Take 40 mg by mouth with breakfast once a day for 5 days 03/10/22  Yes Martyn Ehrich, NP  Psyllium (METAMUCIL SMOOTH TEXTURE PO) Take 1 packet by mouth in the morning.   Yes [provider]  sodium chloride HYPERTONIC 3 % nebulizer solution Take by nebulization as needed for other. Patient taking differently: Take 4 mLs by nebulization as needed (as directed). 03/10/22  Yes Martyn Ehrich, NP  sucralfate (CARAFATE) 1 g tablet Take 1 tablet (1 g total) by mouth 4 (four) times daily -  with meals and at bedtime. 12/06/21 03/11/2022 Yes Dahal, Marlowe Aschoff, MD  tamsulosin (FLOMAX) 0.4 MG CAPS capsule  Take 1 capsule (0.4 mg total) by mouth daily after supper. 07/14/21  Yes Dettinger, [name], MD  dextromethorphan 15 MG/5ML syrup Take 10 mLs (30 mg total) by mouth 4 (four) times daily as needed for cough. Patient not taking: Reported on 03/11/2022 02/08/22   Patrecia Pour, MD  diclofenac Sodium (VOLTAREN) 1 % GEL Apply 2 g topically 4 (four) times daily. Patient not taking: Reported on 03/22/2022 07/14/21   Dettinger, [name], MD  FEROSUL 325 (65 Fe) MG tablet TAKE (1) TABLET DAILY WITH BREAKFAST. Patient not taking: Reported on 03/27/2022 02/10/21   Dettinger, [name], MD  magnesium oxide (MAG-OX) 400 (241.3 Mg) MG tablet Take 1 tablet (400 mg total) by mouth 2 (two) times daily. Patient not taking: Reported on 03/27/2022 06/13/20   Cristal Deer, MD  nitroGLYCERIN (NITROSTAT) 0.4 MG SL tablet Place 1 tablet (0.4 mg total) under the tongue every 5 (five) minutes as needed for chest pain. Patient not taking: Reported on 03/08/2022 07/14/21   Dettinger, [name], MD  psyllium (METAMUCIL) 28 % packet Take 1 packet by mouth daily. Patient not taking: Reported on 03/18/2022 06/07/21   Gwenlyn Perking, FNP  Respiratory Therapy Supplies (FLUTTER) DEVI Twice a day and prn as needed, may increase if feeling worse 05/01/18   Martyn Ehrich, NP  traMADol (ULTRAM) 50 MG tablet Take 1 tablet (50 mg total) by mouth 2 (two) times daily as needed. Patient not taking: Reported on 03/14/2022 09/01/21   Aundra Dubin, PA-C    Physical Exam: Vitals:   03/25/2022 1500 03/25/2022 1523 03/04/2022 1545 03/24/2022 1845  BP: 128/83  128/87 119/81  Pulse: (!) 101 (!) 131 (!) 112 (!) 106  Resp: (!) 36 (!) 32  (!) 28  Temp:    97.7 F (36.5 C)  TempSrc:    Oral  SpO2: 95% 93% 97% 91%   General exam: Appears calm and comfortable  Respiratory system: Clear to auscultation. Respiratory effort normal. Cardiovascular system: S1 & S2 heard, RRR. No  JVD, . No pedal edema. Gastrointestinal system: Abdomen is nondistended, soft  and nontender.  Central nervous system: Alert and oriented. No focal neurological deficits. Extremities: Symmetric 5 x 5 power. Skin: No rashes, lesions or ulcers Psychiatry:  Mood & affect appropriate.   Data Reviewed: Results for orders placed or performed during the hospital encounter of 03/13/2022 (from the past 24 hour(s))  CBC     Status: Abnormal   Collection Time: 03/19/2022 11:47 AM  Result Value Ref Range   WBC 12.3 (H) 4.0 - 10.5 K/uL   RBC 3.50 (L) 4.22 - 5.81 MIL/uL   Hemoglobin 9.9 (L) 13.0 - 17.0 g/dL   HCT 32.3 (L) 39.0 - 52.0 %   MCV 92.3 80.0 - 100.0 fL   MCH 28.3 26.0 - 34.0 pg   MCHC 30.7 30.0 - 36.0 g/dL   RDW 19.5 (H) 11.5 - 15.5 %   Platelets 271 150 - 400 K/uL   nRBC 0.0 0.0 - 0.2 %  Basic metabolic panel     Status: Abnormal   Collection Time: 03/17/2022 11:47 AM  Result Value Ref Range   Sodium 138 135 - 145 mmol/L   Potassium 3.8 3.5 - 5.1 mmol/L   Chloride 109 98 - 111 mmol/L   CO2 20 (L) 22 - 32 mmol/L   Glucose, Bld 95 70 - 99 mg/dL   BUN 21 8 - 23 mg/dL   Creatinine, Ser 1.16 0.61 - 1.24 mg/dL   Calcium 7.7 (L) 8.9 - 10.3 mg/dL   GFR, Estimated >60 >60 mL/min   Anion gap 9 5 - 15  Troponin I (High Sensitivity)     Status: None   Collection Time: 03/15/2022 11:47 AM  Result Value Ref Range   Troponin I (High Sensitivity) 10 <18 ng/L  Brain natriuretic peptide     Status: Abnormal   Collection Time: 03/30/2022 11:49 AM  Result Value Ref Range   B Natriuretic Peptide 463.6 (H) 0.0 - 100.0 pg/mL  Type and screen [place]     Status: None   Collection Time: 03/26/2022  1:00 PM  Result Value Ref Range   ABO/RH(D) O POS    Antibody Screen POS    Sample Expiration 03/15/2022,2359    Antibody Identification      ANTI K NON SPECIFIC ANTIBODY REACTIVITY Performed at [facility], [place] [address], [place], [place] [address]   Troponin I (High Sensitivity)     Status: None   Collection Time: 03/23/2022  3:57 PM  Result  Value Ref Range   Troponin I (High Sensitivity) 10 <18 ng/L   *Note: Due to a large number of results and/or encounters for the requested time period, some results have not been displayed. A complete set of results can be found in Results Review.     Assessment and Plan:  Acute on chronic  respiratory failure with hypoxia secondary to multifocal pneumonia and Acute on chronic diastolic heart failure:  - started him on broad spectrum IV antibiotics.  - currently he is on 6 lit of [place] oxygen.  - send sputum cultures.  - check COVID, influenza, RVP.  - Urine for strep pneumonia and legionella.    Chronic respiratory failure with hypoxia with bronchiectasis: - on 4 lit of [place] oxygen.      Acute on chronic diastolic heart failure:  - last echo shows preserved LVef.  - started him on IV lasix 20 mg BID.  - continue with strict intake and output.  -  daily weights.    Paroxysmal atrial fibrillation:  Rate uncontrolled. Restart the patient on BB.  Not on anti coagulation due to anti coagulation.    Hypothyroidism:  Resume synthroid.     BPH  Resume FLOMAX.    CAD; Pt denies any chest pain.  Troponins are negative.  GERD:  Resume PPI.    Advance Care Planning:   Code Status: DNR   Consults: cardiology will be consulted in the morning.   Family Communication: discussed with the patient's son over the phone.   Severity of Illness: The appropriate patient status for this patient is INPATIENT. Inpatient status is judged to be reasonable and necessary in order to provide the required intensity of service to ensure the patient's safety. The patient's presenting symptoms, physical exam findings, and initial radiographic and laboratory data in the context of their chronic comorbidities is felt to place them at high risk for further clinical deterioration. Furthermore, it is not anticipated that the patient will be medically stable for discharge from the hospital within 2 midnights  of admission.   * I certify that at the point of admission it is my clinical judgment that the patient will require inpatient hospital care spanning beyond 2 midnights from the point of admission due to high intensity of service, high risk for further deterioration and high frequency of surveillance required.*  Author: Hosie Poisson, MD 03/20/2022 7:05 PM  For on call review [email or web address].

## 2022-03-13 ENCOUNTER — Encounter (HOSPITAL_COMMUNITY): Payer: Self-pay | Admitting: Internal Medicine

## 2022-03-13 DIAGNOSIS — D638 Anemia in other chronic diseases classified elsewhere: Secondary | ICD-10-CM | POA: Diagnosis not present

## 2022-03-13 DIAGNOSIS — I5033 Acute on chronic diastolic (congestive) heart failure: Secondary | ICD-10-CM | POA: Diagnosis not present

## 2022-03-13 DIAGNOSIS — J189 Pneumonia, unspecified organism: Secondary | ICD-10-CM | POA: Diagnosis not present

## 2022-03-13 DIAGNOSIS — J181 Lobar pneumonia, unspecified organism: Secondary | ICD-10-CM | POA: Diagnosis present

## 2022-03-13 DIAGNOSIS — J9601 Acute respiratory failure with hypoxia: Secondary | ICD-10-CM | POA: Diagnosis not present

## 2022-03-13 LAB — BASIC METABOLIC PANEL
Anion gap: 10 (ref 5–15)
BUN: 23 mg/dL (ref 8–23)
CO2: 22 mmol/L (ref 22–32)
Calcium: 7.9 mg/dL — ABNORMAL LOW (ref 8.9–10.3)
Chloride: 104 mmol/L (ref 98–111)
Creatinine, Ser: 1.19 mg/dL (ref 0.61–1.24)
GFR, Estimated: 59 mL/min — ABNORMAL LOW (ref >60)
Glucose, Bld: 90 mg/dL (ref 70–99)
Potassium: 3.2 mmol/L — ABNORMAL LOW (ref 3.5–5.1)
Sodium: 136 mmol/L (ref 135–145)

## 2022-03-13 LAB — CBC WITH DIFFERENTIAL/PLATELET
Abs Immature Granulocytes: 0.58 10*3/uL — ABNORMAL HIGH (ref 0.00–0.07)
Basophils Absolute: 0.2 10*3/uL — ABNORMAL HIGH (ref 0.0–0.1)
Basophils Relative: 1 %
Eosinophils Absolute: 0.5 10*3/uL (ref 0.0–0.5)
Eosinophils Relative: 4 %
HCT: 33.8 % — ABNORMAL LOW (ref 39.0–52.0)
Hemoglobin: 10.4 g/dL — ABNORMAL LOW (ref 13.0–17.0)
Immature Granulocytes: 5 %
Lymphocytes Relative: 8 %
Lymphs Abs: 1 10*3/uL (ref 0.7–4.0)
MCH: 27.9 pg (ref 26.0–34.0)
MCHC: 30.8 g/dL (ref 30.0–36.0)
MCV: 90.6 fL (ref 80.0–100.0)
Monocytes Absolute: 1.5 10*3/uL — ABNORMAL HIGH (ref 0.1–1.0)
Monocytes Relative: 12 %
Neutro Abs: 8.9 10*3/uL — ABNORMAL HIGH (ref 1.7–7.7)
Neutrophils Relative %: 70 %
Platelets: 301 10*3/uL (ref 150–400)
RBC: 3.73 MIL/uL — ABNORMAL LOW (ref 4.22–5.81)
RDW: 19.3 % — ABNORMAL HIGH (ref 11.5–15.5)
WBC: 12.7 10*3/uL — ABNORMAL HIGH (ref 4.0–10.5)
nRBC: 0 % (ref 0.0–0.2)

## 2022-03-13 LAB — TYPE AND SCREEN
ABO/RH(D): O POS
Antibody Screen: POSITIVE

## 2022-03-13 LAB — EXPECTORATED SPUTUM ASSESSMENT W GRAM STAIN, RFLX TO RESP C

## 2022-03-13 LAB — STREP PNEUMONIAE URINARY ANTIGEN: Strep Pneumo Urinary Antigen: NEGATIVE

## 2022-03-13 MED ORDER — ALBUTEROL SULFATE (5 MG/ML) 0.5% IN NEBU: 2.5000 mg | INHALATION_SOLUTION | Freq: Four times a day (QID) | RESPIRATORY_TRACT | Status: DC | PRN

## 2022-03-13 MED ORDER — METOPROLOL TARTRATE 5 MG/5ML IV SOLN
2.5000 mg | Freq: Four times a day (QID) | INTRAVENOUS | Status: DC | PRN
  Administered 2022-03-13 (×2): 2.5 mg via INTRAVENOUS
  Filled 2022-03-13 (×2): qty 5

## 2022-03-13 MED ORDER — POTASSIUM CHLORIDE CRYS ER 20 MEQ PO TBCR
40.0000 meq | EXTENDED_RELEASE_TABLET | Freq: Once | ORAL | Status: AC
  Administered 2022-03-13: 40 meq via ORAL
  Filled 2022-03-13: qty 2

## 2022-03-13 MED ORDER — LEVALBUTEROL HCL 0.63 MG/3ML IN NEBU
0.6300 mg | INHALATION_SOLUTION | Freq: Four times a day (QID) | RESPIRATORY_TRACT | Status: DC | PRN
  Administered 2022-03-15: 0.63 mg via RESPIRATORY_TRACT
  Filled 2022-03-13: qty 3

## 2022-03-13 MED ORDER — SODIUM CHLORIDE 3 % IN NEBU
4.0000 mL | INHALATION_SOLUTION | Freq: Two times a day (BID) | RESPIRATORY_TRACT | Status: DC
  Administered 2022-03-13 – 2022-03-15 (×4): 4 mL via RESPIRATORY_TRACT
  Filled 2022-03-13 (×6): qty 4

## 2022-03-13 MED ORDER — ONDANSETRON HCL 4 MG/2ML IJ SOLN
4.0000 mg | Freq: Four times a day (QID) | INTRAMUSCULAR | Status: DC | PRN
  Administered 2022-03-14: 4 mg via INTRAVENOUS
  Filled 2022-03-13: qty 2

## 2022-03-13 MED ORDER — SODIUM CHLORIDE 0.9 % IV SOLN
500.0000 mg | Freq: Every day | INTRAVENOUS | Status: DC
  Administered 2022-03-13 – 2022-03-14 (×2): 500 mg via INTRAVENOUS
  Filled 2022-03-13 (×2): qty 5

## 2022-03-13 MED ORDER — ORAL CARE MOUTH RINSE: 15.0000 mL | OROMUCOSAL | Status: DC | PRN

## 2022-03-13 MED ORDER — POTASSIUM CHLORIDE CRYS ER 20 MEQ PO TBCR
20.0000 meq | EXTENDED_RELEASE_TABLET | Freq: Every day | ORAL | Status: DC
  Administered 2022-03-14 – 2022-03-15 (×2): 20 meq via ORAL
  Filled 2022-03-13 (×2): qty 1

## 2022-03-13 MED ORDER — CHLORHEXIDINE GLUCONATE CLOTH 2 % EX PADS
6.0000 | MEDICATED_PAD | Freq: Every day | CUTANEOUS | Status: DC
  Administered 2022-03-13 – 2022-03-19 (×6): 6 via TOPICAL

## 2022-03-13 MED ORDER — ACETAMINOPHEN 325 MG PO TABS: 650.0000 mg | ORAL_TABLET | ORAL | Status: DC | PRN

## 2022-03-13 NOTE — ED Notes (Signed)
Respiratory paged to see patient

## 2022-03-13 NOTE — ED Notes (Signed)
RT note: Called to ER for pt. in #7 desaturating, placed pt. on 7 lpm HFNC([place]), pt. remains AOX3 after chart/lab results reviewed, RN made aware of updated therapy.

## 2022-03-13 NOTE — Progress Notes (Signed)
[facility]                                                                               [name], is a 89 y.o. male, DOB - [birth year], TKW:[id number] Admit date - 03/24/2022    Outpatient Primary MD for the patient is Dettinger, [name], MD  LOS - 1  days    Brief summary   [name] is a 89 y.o. male with medical history significant of Ascending aorta aneurysm, atrial fibrillation, CAD, GERD, COPD, depression, presents to ED for sob , blood tinged  productive sputum since few weeks. He denies fever or chills. He denies any chest pain. He denies any nausea and vomiting. He denies any syncope. Patient is a poor historian and no family at bedside. He was recently admitted for RSV bronchitis, and discharged to ALF. He is chronically on  4 lit of [place] oxygen. H epresents this morning for sob and an episode of hemoptysis. He also reports worsening pedal edema.    Assessment & Plan    Assessment and Plan:  Acute on chronic respiratory failure with hypoxia secondary to a combination of multifocal pneumonia and mild acute on chronic diastolic heart failure He is currently requiring up to 8 L of nasal cannula oxygen to keep sats greater than 88% He has a history of chronic bronchiectasis with productive sputum He was started on broad-spectrum IV antibiotics, after discussing with pccm, added azithromycin for atypical coverage. Sputum culture sent and pending. Patient's COVID PCR, influenza PCR and RVP, strep pneumonia have been negative. Legionella antigen is pending Meanwhile continue with bronchodilators, flutter valve and hypertonic saline nebs twice a day   Mild acute on chronic diastolic heart failure BNP elevated at pedal edema present Last echocardiogram showed preserved left ventricular ejection fraction and diastolic dysfunction cannot be assessed. Continue with IV Lasix 20 mg twice daily and check strict intake and output, daily weights Monitor renal  parameters while on Lasix.   Paroxysmal atrial fibrillation Currently rate uncontrolled, running between 110-1 20 probably triggered by respiratory illness Not on anticoagulation due to GI bleed    Hypothyroidism Continue with Synthroid    BPH Continue with fluid    History of coronary artery disease Troponins negative Patient denies any chest pain   GERD Continue with PPI   Hypokalemia Replaced   Anemia of chronic disease Hemoglobin around 10.4 continue to monitor   Estimated body mass index is 26.95 kg/m as calculated from the following:   Height as of 03/10/22: '5\' 6"'$  (1.676 m).   Weight as of 03/10/22: 75.8 kg.  Code Status: DNR DVT Prophylaxis:  enoxaparin (LOVENOX) injection 40 mg Start: 03/07/2022 2200   Level of Care: Level of care: Stepdown Family Communication: Updated family at bedside  Disposition Plan:     Remains inpatient appropriate: Broad-spectrum IV antibiotics  Procedures:  None  Consultants:   None  Antimicrobials:   Anti-infectives (From admission, onward)    Start     Dose/Rate Route Frequency Ordered Stop   03/13/22 1130  azithromycin (ZITHROMAX) 500 mg in sodium chloride 0.9 % 250 mL IVPB        500 mg  250 mL/hr over 60 Minutes Intravenous Daily 03/13/22 1126     03/03/2022 1830  vancomycin (VANCOCIN) IVPB 1000 mg/200 mL premix        1,000 mg 200 mL/hr over 60 Minutes Intravenous Every 24 hours 03/06/2022 1740     03/25/2022 1800  ceFEPIme (MAXIPIME) 2 g in sodium chloride 0.9 % 100 mL IVPB        2 g 200 mL/hr over 30 Minutes Intravenous Every 12 hours 03/05/2022 1740          Medications  Scheduled Meds:  enoxaparin (LOVENOX) injection  40 mg Subcutaneous Q24H   furosemide  20 mg Intravenous BID   levothyroxine  75 mcg Oral Q0600   metoprolol succinate  50 mg Oral Daily   pantoprazole  40 mg Oral BID   [START ON 03/14/2022] potassium chloride  20 mEq Oral Daily   psyllium  1 packet Oral Daily   sodium chloride  HYPERTONIC  4 mL Nebulization BID   sucralfate  1 g Oral TID WC & HS   tamsulosin  0.4 mg Oral QPC supper   Continuous Infusions:  azithromycin 500 mg (03/13/22 1131)   ceFEPime (MAXIPIME) IV 2 g (03/13/22 0515)   vancomycin Stopped (03/13/22 0440)   PRN Meds:.acetaminophen, ipratropium, levalbuterol, ondansetron (ZOFRAN) IV    Subjective:   [name] was seen and examined today.  Patient appears pleasant not in distress on 8 L of oxygen reports feeling better than yesterday especially last night.  Objective:   Vitals:   03/13/22 0700 03/13/22 0908 03/13/22 0910 03/13/22 1209  BP: 123/71 124/81 124/81 (!) 96/57  Pulse: (!) 124 (!) 122 (!) 124 (!) 114  Resp: '18 18  16  '$ Temp:  98.2 F (36.8 C)    TempSrc:  Oral    SpO2: 94% 92%  95%    Intake/Output Summary (Last 24 hours) at 03/13/2022 1213 Last data filed at 03/24/2022 1830 Gross per 24 hour  Intake 100 ml  Output --  Net 100 ml   There were no vitals filed for this visit.   Exam General exam: Appears calm and comfortable  Respiratory system: Clear to auscultation. Respiratory effort normal. Cardiovascular system: S1 & S2 heard, RRR. No JVD, murmurs, rubs, gallops or clicks. No pedal edema. Gastrointestinal system: Abdomen is nondistended, soft and nontender.  Central nervous system: Alert and oriented. No focal neurological deficits. Extremities: pedal edema is improving.  Skin: No rashes,  Psychiatry: Mood & affect appropriate.    Data Reviewed:  I have personally reviewed following labs and imaging studies   CBC Lab Results  Component Value Date   WBC 12.7 (H) 03/13/2022   RBC 3.73 (L) 03/13/2022   HGB 10.4 (L) 03/13/2022   HCT 33.8 (L) 03/13/2022   MCV 90.6 03/13/2022   MCH 27.9 03/13/2022   PLT 301 03/13/2022   MCHC 30.8 03/13/2022   RDW 19.3 (H) 03/13/2022   LYMPHSABS 1.0 03/13/2022   MONOABS 1.5 (H) 03/13/2022   EOSABS 0.5 03/13/2022   BASOSABS 0.2 (H) 40/98/1191     Last metabolic  panel Lab Results  Component Value Date   NA 136 03/13/2022   K 3.2 (L) 03/13/2022   CL 104 03/13/2022   CO2 22 03/13/2022   BUN 23 03/13/2022   CREATININE 1.19 03/13/2022   GLUCOSE 90 03/13/2022   GFRNONAA 59 (L) 03/13/2022   GFRAA 74 04/22/2020   CALCIUM 7.9 (L) 03/13/2022   PHOS 2.4 (L) 09/30/2021   PROT 6.2 12/17/2021  ALBUMIN 3.9 12/17/2021   LABGLOB 2.3 12/17/2021   AGRATIO 1.7 12/17/2021   BILITOT 0.7 12/17/2021   ALKPHOS 95 12/17/2021   AST 21 12/17/2021   ALT 14 12/17/2021   ANIONGAP 10 03/13/2022    CBG (last 3)  No results for input(s): "GLUCAP" in the last 72 hours.    Coagulation Profile: No results for input(s): "INR", "PROTIME" in the last 168 hours.   Radiology Studies: CT Angio Chest PE W and/or Wo Contrast  Result Date: 03/19/2022 CLINICAL DATA:  Pulmonary embolism suspected, high probability. Coughing blood. EXAM: CT ANGIOGRAPHY CHEST WITH CONTRAST TECHNIQUE: Multidetector CT imaging of the chest was performed using the standard protocol during bolus administration of intravenous contrast. Multiplanar CT image reconstructions and MIPs were obtained to evaluate the vascular anatomy. RADIATION DOSE REDUCTION: This exam was performed according to the departmental dose-optimization program which includes automated exposure control, adjustment of the mA and/or kV according to patient size and/or use of iterative reconstruction technique. CONTRAST:  170m OMNIPAQUE IOHEXOL 350 MG/ML SOLN COMPARISON:  Chest CT dated 09/30/2021. FINDINGS: Cardiovascular: Study is limited by patient motion artifact, however, there is no pulmonary embolism identified within the main, lobar or central segmental pulmonary arteries bilaterally. No pericardial effusion. Aortic atherosclerosis. Three-vessel coronary artery calcifications. Mediastinum/Nodes: No mass or enlarged lymph nodes are identified within the mediastinum. Scattered small and moderate-sized lymph nodes are likely reactive  in nature. Lungs/Pleura: Diffuse bilateral airspace consolidations and ground-glass opacities, most confluence at the bilateral lung bases and within the LEFT upper lobe. Small bilateral pleural effusions. No pneumothorax. Upper Abdomen: Limited images of the upper abdomen are unremarkable. Musculoskeletal: No acute findings. Chronic compression fracture deformity of a midthoracic vertebral body. Review of the MIP images confirms the above findings. IMPRESSION: 1. Diffuse bilateral airspace consolidations and ground-glass opacities, most confluence at the bilateral lung bases and within the LEFT upper lobe, compatible with multifocal pneumonia, most likely atypical/viral. 2. Small bilateral pleural effusions. 3. No pulmonary embolism identified, with mild study limitations detailed above. 4. Three-vessel coronary artery calcifications. Aortic Atherosclerosis (ICD10-I70.0). Electronically Signed   By: SFranki CabotM.D.   On: 03/18/2022 14:17   DG Chest 2 View  Result Date: 02/28/2022 CLINICAL DATA:  hematemesis, sob, leg swelling EXAM: CHEST - 2 VIEW COMPARISON:  March 10, 2022 FINDINGS: The cardiomediastinal silhouette is unchanged in contour. Small bilateral pleural effusions. No pneumothorax. Continued increased confluent bilateral peripheral pulmonary opacities. There are increasing diffuse reticular opacities in comparison to prior superimposed on a background of emphysema. Visualized abdomen is unremarkable. Multilevel degenerative changes of the thoracic spine. IMPRESSION: 1. Increasing diffuse reticular opacities and confluent peripheral pulmonary opacities. Differential considerations include pulmonary edema and or multifocal infection. Recommend follow-up to resolution. 2. Small bilateral pleural effusions. Electronically Signed   By: SValentino SaxonM.D.   On: 03/02/2022 12:29       VHosie PoissonM.D. [facility] 03/13/2022, 12:13 PM  Available via Epic secure chat 7am-7pm After 7  pm, please refer to night coverage provider listed on amion.

## 2022-03-13 NOTE — ED Notes (Signed)
Pt called out RN entered room to find pt o2 saturation at 83% on 6L [place]. RN placed pt on Non rebreather at 15L pt recovered top 91% on the nonrebreather .

## 2022-03-14 ENCOUNTER — Inpatient Hospital Stay: Payer: Self-pay

## 2022-03-14 ENCOUNTER — Inpatient Hospital Stay (HOSPITAL_COMMUNITY): Payer: Medicare Other

## 2022-03-14 DIAGNOSIS — J189 Pneumonia, unspecified organism: Secondary | ICD-10-CM | POA: Diagnosis not present

## 2022-03-14 DIAGNOSIS — D638 Anemia in other chronic diseases classified elsewhere: Secondary | ICD-10-CM | POA: Diagnosis not present

## 2022-03-14 DIAGNOSIS — E440 Moderate protein-calorie malnutrition: Secondary | ICD-10-CM | POA: Diagnosis present

## 2022-03-14 DIAGNOSIS — J9601 Acute respiratory failure with hypoxia: Secondary | ICD-10-CM | POA: Diagnosis not present

## 2022-03-14 DIAGNOSIS — I5033 Acute on chronic diastolic (congestive) heart failure: Secondary | ICD-10-CM | POA: Diagnosis not present

## 2022-03-14 LAB — CBC WITH DIFFERENTIAL/PLATELET
Abs Immature Granulocytes: 0.8 10*3/uL — ABNORMAL HIGH (ref 0.00–0.07)
Basophils Absolute: 0.1 10*3/uL (ref 0.0–0.1)
Basophils Relative: 1 %
Eosinophils Absolute: 0.6 10*3/uL — ABNORMAL HIGH (ref 0.0–0.5)
Eosinophils Relative: 5 %
HCT: 37.3 % — ABNORMAL LOW (ref 39.0–52.0)
Hemoglobin: 11.6 g/dL — ABNORMAL LOW (ref 13.0–17.0)
Immature Granulocytes: 7 %
Lymphocytes Relative: 9 %
Lymphs Abs: 1 10*3/uL (ref 0.7–4.0)
MCH: 28.4 pg (ref 26.0–34.0)
MCHC: 31.1 g/dL (ref 30.0–36.0)
MCV: 91.2 fL (ref 80.0–100.0)
Monocytes Absolute: 1.4 10*3/uL — ABNORMAL HIGH (ref 0.1–1.0)
Monocytes Relative: 12 %
Neutro Abs: 8.2 10*3/uL — ABNORMAL HIGH (ref 1.7–7.7)
Neutrophils Relative %: 66 %
Platelets: 338 10*3/uL (ref 150–400)
RBC: 4.09 MIL/uL — ABNORMAL LOW (ref 4.22–5.81)
RDW: 19.2 % — ABNORMAL HIGH (ref 11.5–15.5)
WBC: 12.1 10*3/uL — ABNORMAL HIGH (ref 4.0–10.5)
nRBC: 0 % (ref 0.0–0.2)

## 2022-03-14 LAB — COMPREHENSIVE METABOLIC PANEL
ALT: 45 U/L — ABNORMAL HIGH (ref 0–44)
AST: 53 U/L — ABNORMAL HIGH (ref 15–41)
Albumin: 2.6 g/dL — ABNORMAL LOW (ref 3.5–5.0)
Alkaline Phosphatase: 56 U/L (ref 38–126)
Anion gap: 10 (ref 5–15)
BUN: 24 mg/dL — ABNORMAL HIGH (ref 8–23)
CO2: 26 mmol/L (ref 22–32)
Calcium: 8.1 mg/dL — ABNORMAL LOW (ref 8.9–10.3)
Chloride: 102 mmol/L (ref 98–111)
Creatinine, Ser: 1.47 mg/dL — ABNORMAL HIGH (ref 0.61–1.24)
GFR, Estimated: 46 mL/min — ABNORMAL LOW (ref >60)
Glucose, Bld: 95 mg/dL (ref 70–99)
Potassium: 3.2 mmol/L — ABNORMAL LOW (ref 3.5–5.1)
Sodium: 138 mmol/L (ref 135–145)
Total Bilirubin: 0.9 mg/dL (ref 0.3–1.2)
Total Protein: 6.2 g/dL — ABNORMAL LOW (ref 6.5–8.1)

## 2022-03-14 LAB — LACTIC ACID, PLASMA
Lactic Acid, Venous: 1.1 mmol/L (ref 0.5–1.9)
Lactic Acid, Venous: 1 mmol/L (ref 0.5–1.9)

## 2022-03-14 LAB — IRON AND TIBC
Iron: 34 ug/dL — ABNORMAL LOW (ref 45–182)
Saturation Ratios: 17 % — ABNORMAL LOW (ref 17.9–39.5)
TIBC: 202 ug/dL — ABNORMAL LOW (ref 250–450)
UIBC: 168 ug/dL

## 2022-03-14 LAB — VITAMIN B12: Vitamin B-12: 800 pg/mL (ref 180–914)

## 2022-03-14 LAB — PHOSPHORUS: Phosphorus: 4.8 mg/dL — ABNORMAL HIGH (ref 2.5–4.6)

## 2022-03-14 LAB — MAGNESIUM: Magnesium: 2.3 mg/dL (ref 1.7–2.4)

## 2022-03-14 LAB — FERRITIN: Ferritin: 136 ng/mL (ref 24–336)

## 2022-03-14 LAB — FOLATE: Folate: 13.7 ng/mL (ref >5.9)

## 2022-03-14 MED ORDER — BOOST PLUS PO LIQD
237.0000 mL | ORAL | Status: DC
  Administered 2022-03-15 – 2022-03-18 (×3): 237 mL via ORAL
  Filled 2022-03-14 (×5): qty 237

## 2022-03-14 MED ORDER — NOREPINEPHRINE 4 MG/250ML-% IV SOLN
2.0000 ug/min | INTRAVENOUS | Status: DC
  Administered 2022-03-14: 2 ug/min via INTRAVENOUS
  Filled 2022-03-14: qty 250

## 2022-03-14 MED ORDER — SODIUM CHLORIDE 0.9 % IV SOLN: 250.0000 mL | INTRAVENOUS | Status: DC

## 2022-03-14 MED ORDER — DIPHENHYDRAMINE-ZINC ACETATE 2-0.1 % EX CREA
TOPICAL_CREAM | Freq: Three times a day (TID) | CUTANEOUS | Status: DC | PRN
  Filled 2022-03-14: qty 28

## 2022-03-14 MED ORDER — AZITHROMYCIN 250 MG PO TABS
500.0000 mg | ORAL_TABLET | Freq: Every day | ORAL | Status: DC
  Administered 2022-03-15: 500 mg via ORAL
  Filled 2022-03-14: qty 2

## 2022-03-14 MED ORDER — MIDODRINE HCL 5 MG PO TABS
5.0000 mg | ORAL_TABLET | Freq: Three times a day (TID) | ORAL | Status: DC
  Administered 2022-03-15 – 2022-03-17 (×4): 5 mg via ORAL
  Filled 2022-03-14 (×5): qty 1

## 2022-03-14 MED ORDER — LACTATED RINGERS IV BOLUS
500.0000 mL | Freq: Once | INTRAVENOUS | Status: AC
  Administered 2022-03-14: 500 mL via INTRAVENOUS

## 2022-03-14 MED ORDER — HYDROCORTISONE 1 % EX CREA
TOPICAL_CREAM | Freq: Three times a day (TID) | CUTANEOUS | Status: DC
  Filled 2022-03-14: qty 28

## 2022-03-14 MED ORDER — GUAIFENESIN-DM 100-10 MG/5ML PO SYRP
5.0000 mL | ORAL_SOLUTION | ORAL | Status: DC | PRN
  Administered 2022-03-14 – 2022-03-15 (×3): 5 mL via ORAL
  Filled 2022-03-14 (×3): qty 10

## 2022-03-14 MED ORDER — SODIUM CHLORIDE 0.9 % IV BOLUS: 500.0000 mL | Freq: Once | INTRAVENOUS | Status: DC

## 2022-03-14 MED ORDER — ALBUMIN HUMAN 25 % IV SOLN
25.0000 g | Freq: Once | INTRAVENOUS | Status: AC
  Administered 2022-03-14: 25 g via INTRAVENOUS
  Filled 2022-03-14: qty 100

## 2022-03-14 MED ORDER — ENSURE ENLIVE PO LIQD
237.0000 mL | ORAL | Status: DC
  Administered 2022-03-15 – 2022-03-17 (×2): 237 mL via ORAL

## 2022-03-14 MED ORDER — SALINE SPRAY 0.65 % NA SOLN: 1.0000 | NASAL | Status: DC | PRN

## 2022-03-14 MED ORDER — POTASSIUM CHLORIDE CRYS ER 20 MEQ PO TBCR
30.0000 meq | EXTENDED_RELEASE_TABLET | Freq: Once | ORAL | Status: AC
  Administered 2022-03-14: 30 meq via ORAL
  Filled 2022-03-14: qty 1

## 2022-03-14 NOTE — Progress Notes (Signed)
[facility]                                                                               [name], is a 89 y.o. male, DOB - 09/27/1932, SLH:[id number] Admit date - 03/03/2022    Outpatient Primary MD for the patient is Dettinger, [name], MD  LOS - 2  days    Brief summary   [name] is a 89 y.o. male with medical history significant of Ascending aorta aneurysm, atrial fibrillation, CAD, GERD, COPD, depression, presents to ED for sob , blood tinged  productive sputum since few weeks. He denies fever or chills. He denies any chest pain. He denies any nausea and vomiting. He denies any syncope. Patient is a poor historian and no family at bedside. He was recently admitted for RSV bronchitis, and discharged to ALF. He is chronically on  4 lit of [place] oxygen. H presents morning for sob and an episode of hemoptysis. He also reports worsening pedal edema. He was admitted for acute on chronic respiratory failure with hypoxia sec to combination of pneumonia and CHF.    Assessment & Plan    Assessment and Plan:  Acute on chronic respiratory failure with hypoxia secondary to a combination of multifocal pneumonia and mild acute on chronic diastolic heart failure Patient's oxygen requirement increased and he is currently on 10 lit /min. He continues to have  blood tinged productive sputum He has a history of chronic bronchiectasis with productive sputum He was started on broad-spectrum IV antibiotics, after discussing with pccm, added azithromycin for atypical coverage. Vancomycin discontinued as MRSA pcr is engative.  Sputum culture sent and pending. Patient's COVID PCR, influenza PCR and RVP, strep pneumonia have been negative. Legionella antigen is pending Meanwhile continue with bronchodilators, flutter valve and hypertonic saline nebs twice a day   Mild acute on chronic diastolic heart failure BNP elevated, pedal edema present Last echocardiogram showed preserved  left ventricular ejection fraction and diastolic dysfunction cannot be assessed. Lasix stopped due to rise in creatinine and soft BP this am.  Continue with strict intake and output.  Daily weights.  Recheck BMP later tonight.   Hypotension:  Suspect from over diuresis vs sepsis. 1.5 lit fluid bolus given, BP parameters improved but MAP<65 MMHG. .  Get lactic acid.  PCCM consulted for vasopressors.     Paroxysmal atrial fibrillation Currently rate uncontrolled, running between 110-120 probably triggered by respiratory illness Not on anticoagulation due to GI bleed    Hypothyroidism Continue with Synthroid    History of coronary artery disease Troponins negative Patient denies any chest pain   GERD Continue with PPI   Hypokalemia Replaced, recheck in am.    Anemia of chronic disease Hemoglobin between 10 to 11.    Estimated body mass index is 23.84 kg/m as calculated from the following:   Height as of this encounter: '5\' 6"'$  (1.676 m).   Weight as of this encounter: 67 kg.  Code Status: DNR DVT Prophylaxis:  enoxaparin (LOVENOX) injection 40 mg Start: 03/23/2022 2200   Level of Care: Level of care: Stepdown Family Communication: Updated family over the phone.   Disposition Plan:  Remains inpatient appropriate: Broad-spectrum IV antibiotics  Procedures:  None  Consultants:   PCCM.   Antimicrobials:   Anti-infectives (From admission, onward)    Start     Dose/Rate Route Frequency Ordered Stop   03/15/22 1000  azithromycin (ZITHROMAX) tablet 500 mg        500 mg Oral Daily 03/14/22 1142 03/18/22 0959   03/13/22 1130  azithromycin (ZITHROMAX) 500 mg in sodium chloride 0.9 % 250 mL IVPB  Status:  Discontinued        500 mg 250 mL/hr over 60 Minutes Intravenous Daily 03/13/22 1126 03/14/22 1142   03/10/2022 1830  vancomycin (VANCOCIN) IVPB 1000 mg/200 mL premix  Status:  Discontinued        1,000 mg 200 mL/hr over 60 Minutes Intravenous Every 24 hours  03/11/2022 1740 03/14/22 1147   03/04/2022 1800  ceFEPIme (MAXIPIME) 2 g in sodium chloride 0.9 % 100 mL IVPB        2 g 200 mL/hr over 30 Minutes Intravenous Every 12 hours 03/05/2022 1740          Medications  Scheduled Meds:  [START ON 03/15/2022] azithromycin  500 mg Oral Daily   Chlorhexidine Gluconate Cloth  6 each Topical Daily   enoxaparin (LOVENOX) injection  40 mg Subcutaneous Q24H   [START ON 03/15/2022] feeding supplement  237 mL Oral Q24H   [START ON 03/15/2022] lactose free nutrition  237 mL Oral Q24H   levothyroxine  75 mcg Oral Q0600   metoprolol succinate  50 mg Oral Daily   pantoprazole  40 mg Oral BID   potassium chloride  20 mEq Oral Daily   psyllium  1 packet Oral Daily   sodium chloride HYPERTONIC  4 mL Nebulization BID   sucralfate  1 g Oral TID WC & HS   Continuous Infusions:  sodium chloride     ceFEPime (MAXIPIME) IV Stopped (03/14/22 0636)   norepinephrine (LEVOPHED) Adult infusion     sodium chloride     PRN Meds:.acetaminophen, guaiFENesin-dextromethorphan, ipratropium, levalbuterol, ondansetron (ZOFRAN) IV, mouth rinse, sodium chloride    Subjective:   [name] was seen and examined today.  Patient sleepy, not in distress. On 10 lit of [place] oxygen.   Objective:   Vitals:   03/14/22 0743 03/14/22 0748 03/14/22 0800 03/14/22 1200  BP:   116/62 (!) 73/54  Pulse: (!) 105  91 87  Resp: (!) 42  14 20  Temp:   97.6 F (36.4 C) (!) 97.5 F (36.4 C)  TempSrc:   Axillary Axillary  SpO2: 95% 100% 91% 92%  Weight:      Height:        Intake/Output Summary (Last 24 hours) at 03/14/2022 1628 Last data filed at 03/14/2022 0800 Gross per 24 hour  Intake 823.04 ml  Output 950 ml  Net -126.96 ml    Filed Weights   03/13/22 1500 03/14/22 0500  Weight: 70.3 kg 67 kg     Exam General exam: Elderly gentleman, not in distress.  Respiratory system: diminished air entry , scattered rhonchi. On 10 lit of [place] oxygen.  Cardiovascular system: S1 & S2  heard, RRR. No JVD,  Gastrointestinal system: Abdomen is nondistended, soft and nontender.  Central nervous system: Alert and oriented.  Grossly non focal.  Extremities: improved edema.  Skin: No rashes, Psychiatry: lethargic.     Data Reviewed:  I have personally reviewed following labs and imaging studies   CBC Lab Results  Component Value Date   WBC  12.1 (H) 03/14/2022   RBC 4.09 (L) 03/14/2022   HGB 11.6 (L) 03/14/2022   HCT 37.3 (L) 03/14/2022   MCV 91.2 03/14/2022   MCH 28.4 03/14/2022   PLT 338 03/14/2022   MCHC 31.1 03/14/2022   RDW 19.2 (H) 03/14/2022   LYMPHSABS 1.0 03/14/2022   MONOABS 1.4 (H) 03/14/2022   EOSABS 0.6 (H) 03/14/2022   BASOSABS 0.1 67/67/2094     Last metabolic panel Lab Results  Component Value Date   NA 138 03/14/2022   K 3.2 (L) 03/14/2022   CL 102 03/14/2022   CO2 26 03/14/2022   BUN 24 (H) 03/14/2022   CREATININE 1.47 (H) 03/14/2022   GLUCOSE 95 03/14/2022   GFRNONAA 46 (L) 03/14/2022   GFRAA 74 04/22/2020   CALCIUM 8.1 (L) 03/14/2022   PHOS 4.8 (H) 03/14/2022   PROT 6.2 (L) 03/14/2022   ALBUMIN 2.6 (L) 03/14/2022   LABGLOB 2.3 12/17/2021   AGRATIO 1.7 12/17/2021   BILITOT 0.9 03/14/2022   ALKPHOS 56 03/14/2022   AST 53 (H) 03/14/2022   ALT 45 (H) 03/14/2022   ANIONGAP 10 03/14/2022    CBG (last 3)  No results for input(s): "GLUCAP" in the last 72 hours.    Coagulation Profile: No results for input(s): "INR", "PROTIME" in the last 168 hours.   Radiology Studies: DG CHEST PORT 1 VIEW  Result Date: 03/14/2022 CLINICAL DATA:  Hypoxia EXAM: PORTABLE CHEST 1 VIEW COMPARISON:  03/27/2022 FINDINGS: Single frontal view of the chest demonstrates stable enlargement of the cardiac silhouette. There is multifocal bilateral airspace disease, with a waxing and waning appearance since prior study. Slight clearing of the consolidation in the left midlung zone, with increased airspace disease in the left upper lobe and right perihilar  regions. Persistent dense left basilar consolidation and likely underlying small pleural effusion. No pneumothorax. IMPRESSION: 1. Waxing and waning bilateral multifocal airspace disease, with overall slight progression since prior study, consistent with worsening edema or infection. 2. Stable left pleural effusion. Electronically Signed   By: Randa Ngo M.D.   On: 03/14/2022 15:46   [place] EKG SITE RITE  Result Date: 03/14/2022 If Site Rite image not attached, placement could not be confirmed due to current cardiac rhythm.      Hosie Poisson M.D. [facility] 03/14/2022, 4:28 PM  Available via Epic secure chat 7am-7pm After 7 pm, please refer to night coverage provider listed on amion.

## 2022-03-14 NOTE — Progress Notes (Addendum)
Per report his evening by Day shift RN,at 1830 patient had an episode of possible aspiration of food from dinner. He has had significant coughing during the start of this shift. He was transitioned to heated high flow at 1900. He is currently on 35L and 75% fio2.

## 2022-03-14 NOTE — IPAL (Signed)
  Interdisciplinary Goals of Care Family Meeting   Date carried out: 03/14/2022  Location of the meeting: Bedside  Member's involved: Physician, Bedside Registered Nurse, and Family Member or next of kin  Durable Power of Attorney or acting medical decision maker: [name]  Discussion: We discussed goals of care for [name] .  We discussed goals of care per family's wishes. Patient states he would not want to be intubated or have chest compressions done. He would like to be made comfortable if that setting arises. He wants to continue current medical management.   Code status:   Code Status: DNR   Disposition: Continue current acute care  Time spent for the meeting: 20 minutes    [name], MD  03/14/2022, 5:54 PM

## 2022-03-14 NOTE — Progress Notes (Signed)
Heart Failure Navigator Progress Note  Assessed for Heart & Vascular TOC clinic readiness.  Patient has close [facility] appointment on 03/28/2022.   Navigator will sign off at this time.   Earnestine Leys, BSN, Clinical cytogeneticist Only

## 2022-03-14 NOTE — TOC Initial Note (Addendum)
Transition of Care [facility]) - Initial/Assessment Note    Patient Details  Name: [name] MRN: [id number] Date of Birth: 02-22-1933  Transition of Care [facility]) CM/SW Contact:    Roseanne Kaufman, RN Phone Number: 03/14/2022, 2:43 PM  Clinical Narrative:    Received TOC consult for CHF screening. PT recommends HHPT. Patient from [facility]. This RNCM spoke with patient to offer choice for [facility] services. Patient reports he does not have a preference for [facility] agencies will accept any HH that takes his insurance. Patient is appropriate for CHF screening. Patient reports having home oxygen however not sure which agency provides oxygen.   This RNCM spoke with CC with [facility], who reports [facility] provides HHPT/OT services for their ALF and if [facility] is needed then [facility] provides services.  This RNCM notified Kelly with [facility] for HHPT, HHRN, awaiting a response.    TOC will continue to follow.              - 3:40pm Kelly with [facility] will follow patient for HHPT, [facility] services, updated AVS.   TOC following for discharge needs.  Patient Goals and CMS Choice    Return to ALF        Expected Discharge Plan and Services  Continued medical work up ( HHPT, [facility])                                          Prior Living Arrangements/Services    ALF [facility])                   Activities of Daily Living      Permission Sought/Granted                  Emotional Assessment              Admission diagnosis:  Shortness of breath [R06.02] Acute respiratory failure with hypoxia ([place]) [J96.01] Multifocal pneumonia [J18.9] Patient Active Problem List   Diagnosis Date Noted   Multifocal pneumonia 03/13/2022   Acute respiratory failure with hypoxia ([place]) 03/24/2022   Chronic respiratory failure with hypoxia ([place]) 03/10/2022   RSV (respiratory syncytial virus infection) 02/09/2022   Chest pain 79/89/2119   Diastolic CHF ([place]) 41/74/0814    Leukocytosis 02/07/2022   Anemia due to chronic blood loss 01/12/2022   GI bleeding 12/02/2021   Ulcer of esophagus with bleeding    Barrett's esophagus 06/09/2020   Memory disorder 05/03/2019   Chronic constipation 10/17/2018   Bronchiectasis with acute exacerbation ([place]) 11/27/2017   Peripheral vascular insufficiency ([place]) 07/19/2017   Aneurysmal dilatation ([place]) 07/19/2017   Aortic atherosclerosis ([place]) 11/21/2016   Thoracic ascending aortic aneurysm ([place]) 08/10/2016   CAD (coronary artery disease) 08/10/2016   Hiatal hernia 05/04/2016   Esophageal stricture    Afib ([place]) 06/12/2015   Neuropathy 12/31/2013   Vitamin B 12 deficiency 12/31/2013   Personal history of colonic polyps 01/03/2011   COPD (chronic obstructive pulmonary disease) ([place]) 08/24/2010   COLONIC POLYPS 07/17/2008   Macrocytic anemia/blood loss anemia 07/17/2008   Gastroesophageal reflux disease with esophagitis 07/17/2008   DEGENERATIVE JOINT DISEASE 07/17/2008   Osteoporosis 07/17/2008   BPH (benign prostatic hyperplasia) 07/17/2008   Hyperprolactinemia/pituitary adenoma 10/24/2007   PITUITARY ADENOMA 06/25/2007   Hypothyroidism 06/25/2007   Hyperlipidemia LDL goal <70 06/22/2007   PCP:  Dettinger, [name], MD Pharmacy:  [place] [place] [address] Phone: [phone] Fax: [place], [place] [address] [place] [address] [place] [address] Phone: [phone] Fax: [phone]     Social Determinants of Health (SDOH) Social History: [place]: No Food Insecurity (12/02/2021)  Housing: Low Risk  (12/02/2021)  Transportation Needs: No Transportation Needs (12/02/2021)  Utilities: Not At Risk (12/02/2021)  Alcohol Screen: Low Risk  (02/08/2021)  Depression (PHQ2-9): Medium Risk (01/28/2022)  Financial Resource Strain:  Low Risk  (02/08/2021)  Physical Activity: Insufficiently Active (02/08/2021)  Social Connections: Moderately Integrated (02/08/2021)  Stress: No Stress Concern Present (02/08/2021)  Tobacco Use: Low Risk  (03/13/2022)   SDOH Interventions:     Readmission Risk Interventions    10/05/2021   10:34 AM  Readmission Risk Prevention Plan  Post Dischage Appt Complete  Medication Screening Complete  Transportation Screening Complete

## 2022-03-14 NOTE — Progress Notes (Signed)
Initial Nutrition Assessment  DOCUMENTATION CODES:   Non-severe (moderate) malnutrition in context of chronic illness  INTERVENTION:  - 2g sodium diet per MD.  - Ensure Plus High Protein po once daily, provides 350 kcal and 20 grams of protein. - Boost Plus po once daily, provides 360 kcal and 14 grams of protein - Monitor weight trends.    NUTRITION DIAGNOSIS:   Moderate Malnutrition related to chronic illness (COPD, CAD) as evidenced by mild fat depletion, moderate muscle depletion.  GOAL:   Patient will meet greater than or equal to 90% of their needs  MONITOR:   PO intake, Supplement acceptance, Weight trends  REASON FOR ASSESSMENT:   Consult Assessment of nutrition requirement/status  ASSESSMENT:   89 y.o. male with medical history significant of Ascending aorta aneurysm, atrial fibrillation, CAD, GERD, COPD, depression who presented for SOB , blood tinged  productive sputum since few weeks. Found to have acute on chronic respiratory failure secondary to PNA and acute on chronic heart failure.   Patient resting in bed at time of visit. Lunch tray at bedside, a couple bites of meatloaf taken. RN present In room, reports patient got hypotensive and started not feeling very well so took a break from eating and just resting for now.  Patient reports UBW of 150# and feels he may have lost a few pounds recently but nothing significant he feels. Unsure why he may have lost a few pounds. Per EMR, patient had a 5% weight loss from June to August but weight without significant changes since that time.  Patient endorses living at an assisted living facility so he is provided with 3 meals a day. Usually eats all 3 meals and also has a fridge in his room where he keeps Ensure and Boost that he will occasionally drinks. He shares he likes Boost more but the Ensure he has has more protein so he likes to have them both.   Patient notes appetite is fair. Agreeable to receive Ensure and  Boost during admission. Notes he likes them cold but isn't usually able to drink a whole bottle at once. Informed patient his RN can provide ice to make supplement cold. Discussed also trying them with ice cream. Patient endorsed understanding, no questions or concerns.  Medications reviewed and include: Synthroid, Carafate  Labs reviewed: K+ 3.2  Creatinine 1.47 Phosphorus 4.8   NUTRITION - FOCUSED PHYSICAL EXAM:  Flowsheet Row Most Recent Value  Orbital Region Mild depletion  Upper Arm Region Moderate depletion  Thoracic and Lumbar Region Mild depletion  Buccal Region Mild depletion  Temple Region Moderate depletion  Clavicle Bone Region Severe depletion  Clavicle and Acromion Bone Region Moderate depletion  Scapular Bone Region Unable to assess  Dorsal Hand Moderate depletion  Patellar Region Moderate depletion  Anterior Thigh Region Moderate depletion  Posterior Calf Region Moderate depletion  Edema (RD Assessment) None  Hair Reviewed  Eyes Reviewed  Mouth Reviewed  Skin Reviewed  Nails Reviewed       Diet Order:   Diet Order             Diet 2 gram sodium Room service appropriate? Yes; Fluid consistency: Thin  Diet effective now                   EDUCATION NEEDS:  Education needs have been addressed  Skin:  Skin Assessment: Reviewed RN Assessment  Last BM:  PTA  Height:  Ht Readings from Last 1 Encounters:  03/13/22 '5\' 6"'$  (1.676  m)   Weight:  Wt Readings from Last 1 Encounters:  03/14/22 67 kg    BMI:  Body mass index is 23.84 kg/m.  Estimated Nutritional Needs:  Kcal:  1800-2000 kcals Protein:  75-85 grams Fluid:  >/= 1.8L    Samson Frederic RD, LDN For contact information, refer to [facility].

## 2022-03-14 NOTE — Consult Note (Signed)
NAME:  [name], MRN:  [id number], DOB:  [birth year], LOS: 2 ADMISSION DATE:  03/25/2022, CONSULTATION DATE:  03/14/22 REFERRING MD:  [name], CHIEF COMPLAINT:  hypotension   History of Present Illness:  [name] is a 89 y.o. M with PMH significant for bronchiectasis-never smoker and followed by Dr. Loanne [name] on 2-5L home O2, AAA, atrial fibrillation, CAD, Barrett's esophagus, pituitary macroadenoma with recent RSV bronchitis admission and discharged to SNF on  02/10/22 who presented to the ED on 1/13 with several weeks of blood tinged sputum.  He was seen in the pulmonary office four days prior to admission with similar symptoms and prescribed Levaquin and Prednisone.   CXR with diffuse bilateral ground glass opacities and LUL infiltrate consistent with multifocal PNA.    He was admitted and treated with vancomycin and cefepime, covid, flu and RSV and RVP all negative.   His BNP was elevated so was started on Lasix '20mg'$  bid and received metoprolol this morning.  Later in the day his blood pressure down-trended and PCCM consulted.  MAP 55-60, states that his head feels "fuzzy" and he is tired, otherwise is feeling improved.  No worsening tachycardia or fevers, bloodwork this morning was stable with WBC of 12k.  Pertinent  Medical History   has a past medical history of Abnormality of gait (06/07/2013), Anemia, Aneurysm ([place]), Anxiety, Arthritis, Asthma, Atrial fibrillation ([place]), B12 deficiency, Barrett's esophagus, CAD (coronary artery disease) (03/2006), Cancer ([place]), Cataract, COLONIC POLYPS (33/29/5188), Complication of anesthesia, COPD (chronic obstructive pulmonary disease) ([place]), Depression, Diverticulosis, Emphysema, Enlarged prostate with urinary retention, Esophageal stenosis, Gastric ulcer, GERD (gastroesophageal reflux disease), Hiatal hernia, Hypothyroidism, Neuropathy, Osteoporosis, Pituitary macroadenoma (HCC), Restless legs syndrome (RLS), Tubular adenoma of colon, and UGIB (upper  gastrointestinal bleed) (03/2013).   Significant Hospital Events: Including procedures, antibiotic start and stop dates in addition to other pertinent events   1/13 admit, started on vanc/cefepime 1/15 PCCM consult, added azithryomycin  Interim History / Subjective:    As above   Objective   Blood pressure (!) 73/54, pulse 87, temperature (!) 97.5 F (36.4 C), temperature source Axillary, resp. rate 20, height '5\' 6"'$  (1.676 m), weight 67 kg, SpO2 92 %.        Intake/Output Summary (Last 24 hours) at 03/14/2022 1429 Last data filed at 03/14/2022 0800 Gross per 24 hour  Intake 1346.22 ml  Output 950 ml  Net 396.22 ml   Filed Weights   03/13/22 1500 03/14/22 0500  Weight: 70.3 kg 67 kg   General:  elderly M, sitting up in bed in no distress watching TV HEENT: MM pink/moist, sclera anicteric Neuro: awake, alert, oriented  CV: s1s2 slightly tachycardic, irregular, no m/r/g PULM:  decreased air entry bilateral bases few scattered rhonchi, no wheezing, no tachypnea or accessory muscle use on 10L, minimal secretions GI: soft, non-distended  Extremities: warm/dry, trace pre-tibial edema  Skin: no rashes or lesions   Resolved Hospital Problem list     Assessment & Plan:    Hypotension MAP 55-60, on review of previous admissions it appears his BP is usually 100/60's  Given metoprolol 2.'5mg'$  (does not take at home)  and Lasix '20mg'$  and suspect this has contributed, he does not appear septic or otherwise to be clinically worsening -1L IVF bolus running, if remains hypotensive then start peripheral Levophed and check lactic acid  Acute on Chronic Hypoxic Respiratory Failure likely secondary to bronchiectasis exacerbation and multi-focal pneumonia Sputum culture pending, viral work-up has been negative -continue Vanc/cefepime add azithromycin for  atypicals  -cont. Hypertonic saline nebs and bronchodilators -consider steroids -does not have increased sputum production, if worsens  then chest PT and mucolytics  -IS and flutter valve      Best Practice (right click and "Reselect all SmartList Selections" daily)   Per primary  Labs   CBC: Recent Labs  Lab 03/10/22 0956 03/05/2022 1147 03/13/22 0512 03/14/22 0247  WBC 11.9* 12.3* 12.7* 12.1*  NEUTROABS 9.3*  --  8.9* 8.2*  HGB 10.7* 9.9* 10.4* 11.6*  HCT 33.8* 32.3* 33.8* 37.3*  MCV 88.8 92.3 90.6 91.2  PLT 267.0 271 301 389    Basic Metabolic Panel: Recent Labs  Lab 03/10/22 0956 03/05/2022 1147 03/13/22 0512 03/14/22 0247  NA 141 138 136 138  K 4.3 3.8 3.2* 3.2*  CL 108 109 104 102  CO2 23 20* 22 26  GLUCOSE 106* 95 90 95  BUN '19 21 23 '$ 24*  CREATININE 1.12 1.16 1.19 1.47*  CALCIUM 8.2* 7.7* 7.9* 8.1*  MG  --   --   --  2.3  PHOS  --   --   --  4.8*   GFR: Estimated Creatinine Clearance: 31.9 mL/min (A) (by C-G formula based on SCr of 1.47 mg/dL (H)). Recent Labs  Lab 03/10/22 0956 03/01/2022 1147 03/13/22 0512 03/14/22 0247  WBC 11.9* 12.3* 12.7* 12.1*    Liver Function Tests: Recent Labs  Lab 03/14/22 0247  AST 53*  ALT 45*  ALKPHOS 56  BILITOT 0.9  PROT 6.2*  ALBUMIN 2.6*   No results for input(s): "LIPASE", "AMYLASE" in the last 168 hours. No results for input(s): "AMMONIA" in the last 168 hours.  ABG    Component Value Date/Time   TCO2 18 (L) 12/04/2021 0742     Coagulation Profile: No results for input(s): "INR", "PROTIME" in the last 168 hours.  Cardiac Enzymes: No results for input(s): "CKTOTAL", "CKMB", "CKMBINDEX", "TROPONINI" in the last 168 hours.  HbA1C: No results found for: "HGBA1C"  CBG: No results for input(s): "GLUCAP" in the last 168 hours.  Review of Systems:   Please see the history of present illness. All other systems reviewed and are negative    Past Medical History:  He,  has a past medical history of Abnormality of gait (06/07/2013), Anemia, Aneurysm ([place]), Anxiety, Arthritis, Asthma, Atrial fibrillation ([place]), B12 deficiency,  Barrett's esophagus, CAD (coronary artery disease) (03/2006), Cancer ([place]), Cataract, COLONIC POLYPS (37/34/2876), Complication of anesthesia, COPD (chronic obstructive pulmonary disease) ([place]), Depression, Diverticulosis, Emphysema, Enlarged prostate with urinary retention, Esophageal stenosis, Gastric ulcer, GERD (gastroesophageal reflux disease), Hiatal hernia, Hypothyroidism, Neuropathy, Osteoporosis, Pituitary macroadenoma (HCC), Restless legs syndrome (RLS), Tubular adenoma of colon, and UGIB (upper gastrointestinal bleed) (03/2013).   Surgical History:   Past Surgical History:  Procedure Laterality Date   ABDOMINAL HERNIA REPAIR   2008   BIOPSY  06/13/2020   Procedure: BIOPSY;  Surgeon: Harvel Quale, MD;  [facility];  Service: Gastroenterology;;  gastric   BIOPSY  12/04/2021   Procedure: BIOPSY;  Surgeon: Juanita Craver, MD;  [facility]: [facility];  Service: Gastroenterology;;   CARDIAC CATHETERIZATION  01/2007   L main 30%, oLAD 50%,  pLAD stent ok, CFX 80%, OM 60%, pRCA 60%, mRCA 70%, oPDA 90%; med rx   cataract extraction both eyes     COLONOSCOPY     COLONOSCOPY WITH PROPOFOL N/A 06/13/2020   Procedure: COLONOSCOPY WITH PROPOFOL;  Surgeon: Harvel Quale, MD;  [facility];  Service: Gastroenterology;  Laterality: N/A;  COLONOSCOPY WITH PROPOFOL N/A 12/04/2021   Procedure: COLONOSCOPY WITH PROPOFOL;  Surgeon: Juanita Craver, MD;  [facility];  Service: Gastroenterology;  Laterality: N/A;   CORONARY ANGIOPLASTY     CORONARY STENT PLACEMENT  03/2006   3.0 x 20 mm TAXUS [place] DES to the LAD   CYSTOSCOPY N/A 06/10/2015   Procedure: CYSTOSCOPY FULGRATION OF BLEEDING,  electovapor resection;  Surgeon: Irine Seal, MD;  [facility];  Service: Urology;  Laterality: N/A;   CYSTOSCOPY WITH INSERTION OF UROLIFT N/A 06/01/2015   Procedure: CYSTOSCOPY WITH INSERTION OF UROLIFT x4;  Surgeon: Irine Seal, MD;  [facility];  Service:  Urology;  Laterality: N/A;   ESOPHAGOGASTRODUODENOSCOPY N/A 04/20/2013   Procedure: ESOPHAGOGASTRODUODENOSCOPY (EGD);  Surgeon: Inda Castle, MD;  [facility];  Service: Endoscopy;  Laterality: N/A;   ESOPHAGOGASTRODUODENOSCOPY N/A 03/25/2016   Procedure: ESOPHAGOGASTRODUODENOSCOPY (EGD);  Surgeon: Irene Shipper, MD;  Location[facility];  Service: Endoscopy;  Laterality: N/A;   ESOPHAGOGASTRODUODENOSCOPY (EGD) WITH PROPOFOL N/A 10/13/2015   Procedure: ESOPHAGOGASTRODUODENOSCOPY (EGD) WITH PROPOFOL;  Surgeon: Jerene Bears, MD;  [facility];  Service: Gastroenterology;  Laterality: N/A;   ESOPHAGOGASTRODUODENOSCOPY (EGD) WITH PROPOFOL N/A 06/13/2020   Procedure: ESOPHAGOGASTRODUODENOSCOPY (EGD) WITH PROPOFOL;  Surgeon: Harvel Quale, MD;  [facility];  Service: Gastroenterology;  Laterality: N/A;   ESOPHAGOGASTRODUODENOSCOPY (EGD) WITH PROPOFOL N/A 10/02/2021   Procedure: ESOPHAGOGASTRODUODENOSCOPY (EGD) WITH PROPOFOL;  Surgeon: Daryel November, MD;  [facility];  Service: Gastroenterology;  Laterality: N/A;   ESOPHAGOGASTRODUODENOSCOPY (EGD) WITH PROPOFOL N/A 12/03/2021   Procedure: ESOPHAGOGASTRODUODENOSCOPY (EGD) WITH PROPOFOL;  Surgeon: Ladene Artist, MD;  [facility];  Service: Gastroenterology;  Laterality: N/A;   FOOT SURGERY  1994 left, 2002 right foot   bilateral foot reconstruciton   FOOT SURGERY     reconstruction of both feet- no retained hardware.   HERNIA REPAIR     HIATAL HERNIA REPAIR  01-04-2008   HOT HEMOSTASIS N/A 10/02/2021   Procedure: HOT HEMOSTASIS (ARGON PLASMA COAGULATION/BICAP);  Surgeon: Daryel November, MD;  [facility];  Service: Gastroenterology;  Laterality: N/A;   JOINT REPLACEMENT     MELANOMA EXCISION  2019   POLYPECTOMY     POLYPECTOMY  06/13/2020   Procedure: POLYPECTOMY;  Surgeon: Harvel Quale, MD;  [facility];  Service: Gastroenterology;;   ascending,    POLYPECTOMY  12/04/2021   Procedure: POLYPECTOMY;  Surgeon: Juanita Craver, MD;  Location[facility];  Service: Gastroenterology;;   PROSTATE SURGERY     x 2   SAVORY DILATION N/A 03/25/2016   Procedure: SAVORY DILATION;  Surgeon: Irene Shipper, MD;  [facility];  Service: Endoscopy;  Laterality: N/A;   TOTAL HIP ARTHROPLASTY  07/21/2011   Procedure: TOTAL HIP ARTHROPLASTY ANTERIOR APPROACH;  Surgeon: Mauri Pole, MD;  [facility];  Service: Orthopedics;  Laterality: Left;   TOTAL HIP ARTHROPLASTY Right 09/07/2012   Procedure: RIGHT TOTAL HIP ARTHROPLASTY ANTERIOR APPROACH;  Surgeon: Mcarthur Rossetti, MD;  [facility];  Service: Orthopedics;  Laterality: Right;   TRANSURETHRAL RESECTION OF BLADDER NECK N/A 11/04/2015   Procedure: RESECTION OF BLADDER NECK;  Surgeon: Cleon Gustin, MD;  [facility];  Service: Urology;  Laterality: N/A;   TRANSURETHRAL RESECTION OF PROSTATE N/A 11/04/2015   Procedure: TRANSURETHRAL RESECTION OF THE PROSTATE (TURP); REMOVAL OF UROLIFT IMPLANTS X THREE;  Surgeon: Cleon Gustin, MD;  [facility];  Service: Urology;  Laterality:  N/A;   UPPER GASTROINTESTINAL ENDOSCOPY  12/2013   Dr Hilarie Fredrickson, gastritis   VIDEO BRONCHOSCOPY Bilateral 02/01/2017   Procedure: VIDEO BRONCHOSCOPY WITHOUT FLUORO;  Surgeon: Tanda Rockers, MD;  [facility];  Service: Cardiopulmonary;  Laterality: Bilateral;     Social History:   reports that he has never smoked. He has never used smokeless tobacco. He reports that he does not drink alcohol and does not use drugs.   Family History:  His family history includes Heart disease in his sister; Heart failure in his father; Other in his mother. There is no history of Colon cancer, Esophageal cancer, Rectal cancer, Stomach cancer, or Colon polyps.   Allergies Allergies  Allergen Reactions   Gadavist [Gadobutrol] Hives   Prednisone Other (See Comments)    Agitation and  hallucinations   Betadine [Povidone Iodine] Other (See Comments)    Blisters    Lortab [Hydrocodone-Acetaminophen] Nausea And Vomiting   Penicillins Other (See Comments)    Lightheadedness  Has patient had a PCN reaction causing immediate rash, facial/tongue/throat swelling, SOB or lightheadedness with hypotension: Yes Has patient had a PCN reaction causing severe rash involving mucus membranes or skin necrosis: No Has patient had a PCN reaction that required hospitalization No Has patient had a PCN reaction occurring within the last 10 years: No If all of the above answers are "NO", then may proceed with Cephalosporin use.   Zetia [Ezetimibe] Other (See Comments)    Muscle weakness    Zocor [Simvastatin] Nausea Only and Other (See Comments)    Muscle weakness      Home Medications  Prior to Admission medications   Medication Sig Start Date End Date Taking? Authorizing Provider  albuterol (PROVENTIL) (2.5 MG/3ML) 0.083% nebulizer solution Take 3 mLs (2.5 mg total) by nebulization every 4 (four) hours as needed for wheezing or shortness of breath. 03/10/22  Yes Martyn Ehrich, NP  budesonide-formoterol [facility]) 160-4.5 MCG/ACT inhaler Inhale 2 puffs into the lungs in the morning and at bedtime. 03/05/21  Yes Margaretha Seeds, MD  cabergoline (DOSTINEX) 0.5 MG tablet Take 1 tablet (0.5 mg total) by mouth 2 (two) times a week. 01/13/22  Yes Nida, Marella Chimes, MD  dextromethorphan-guaiFENesin (MUCINEX DM) 30-600 MG 12hr tablet Take 1 tablet by mouth 2 (two) times daily. 03/10/22  Yes Martyn Ehrich, NP  furosemide (LASIX) 20 MG tablet Take 1 tablet daily x 5 days Patient taking differently: Take 20 mg by mouth daily. Start date 03/27/2022- Take 20 mg by mouth once a day for 5 days 03/11/22  Yes Martyn Ehrich, NP  GAVILAX 17 GM/SCOOP powder DISSOLVE 17 GM SCOOP IN 8 OZ. OF WATER AND DRINK ONCE DAILY AS NEEDED FOR CONSTIPATION Patient taking differently: Take 17 g by mouth daily  as needed for mild constipation (mixed into 4-8 ounces of water). 06/29/20  Yes Pyrtle, Lajuan Lines, MD  levalbuterol [facility] HFA) 45 MCG/ACT inhaler Inhale 1 puff into the lungs every 4 (four) hours as needed for wheezing. 12/06/21 12/06/22 Yes Dahal, Marlowe Aschoff, MD  levofloxacin (LEVAQUIN) 500 MG tablet Take 1 tablet (500 mg total) by mouth daily. Patient taking differently: Take 500 mg by mouth See admin instructions. Start date 03/11/2022- Take 500 mg by mouth once a day for 10 days 03/10/22  Yes Martyn Ehrich, NP  levothyroxine (SYNTHROID) 75 MCG tablet Take 1 tablet (75 mcg total) by mouth daily before breakfast. 12/01/21  Yes Dettinger, [name], MD  metoprolol succinate (TOPROL-XL) 50 MG 24 hr  tablet Take 1 tablet (50 mg total) by mouth daily. Take with or immediately following a meal. 02/15/22  Yes Hassell Done, Mary-Margaret, FNP  pantoprazole (PROTONIX) 40 MG tablet Take 1 tablet (40 mg total) by mouth 2 (two) times daily. 12/06/21 03/30/2022 Yes Dahal, Marlowe Aschoff, MD  predniSONE (DELTASONE) 20 MG tablet Take 2 tablets (40 mg total) by mouth daily with breakfast. Patient taking differently: Take 40 mg by mouth See admin instructions. Start date 03/06/2022- Take 40 mg by mouth with breakfast once a day for 5 days 03/10/22  Yes Martyn Ehrich, NP  Psyllium (METAMUCIL SMOOTH TEXTURE PO) Take 1 packet by mouth in the morning.   Yes [provider]  sodium chloride HYPERTONIC 3 % nebulizer solution Take by nebulization as needed for other. Patient taking differently: Take 4 mLs by nebulization as needed (as directed). 03/10/22  Yes Martyn Ehrich, NP  sucralfate (CARAFATE) 1 g tablet Take 1 tablet (1 g total) by mouth 4 (four) times daily -  with meals and at bedtime. 12/06/21 03/13/2022 Yes Dahal, Marlowe Aschoff, MD  tamsulosin (FLOMAX) 0.4 MG CAPS capsule Take 1 capsule (0.4 mg total) by mouth daily after supper. 07/14/21  Yes Dettinger, [name], MD  dextromethorphan 15 MG/5ML syrup Take 10 mLs (30 mg total) by  mouth 4 (four) times daily as needed for cough. Patient not taking: Reported on 03/24/2022 02/08/22   Patrecia Pour, MD  diclofenac Sodium (VOLTAREN) 1 % GEL Apply 2 g topically 4 (four) times daily. Patient not taking: Reported on 03/05/2022 07/14/21   Dettinger, [name], MD  FEROSUL 325 (65 Fe) MG tablet TAKE (1) TABLET DAILY WITH BREAKFAST. Patient not taking: Reported on 03/09/2022 02/10/21   Dettinger, [name], MD  magnesium oxide (MAG-OX) 400 (241.3 Mg) MG tablet Take 1 tablet (400 mg total) by mouth 2 (two) times daily. Patient not taking: Reported on 03/17/2022 06/13/20   Cristal Deer, MD  nitroGLYCERIN (NITROSTAT) 0.4 MG SL tablet Place 1 tablet (0.4 mg total) under the tongue every 5 (five) minutes as needed for chest pain. Patient not taking: Reported on 03/30/2022 07/14/21   Dettinger, [name], MD  psyllium (METAMUCIL) 28 % packet Take 1 packet by mouth daily. Patient not taking: Reported on 03/09/2022 06/07/21   Gwenlyn Perking, FNP  Respiratory Therapy Supplies (FLUTTER) DEVI Twice a day and prn as needed, may increase if feeling worse 05/01/18   Martyn Ehrich, NP  traMADol (ULTRAM) 50 MG tablet Take 1 tablet (50 mg total) by mouth 2 (two) times daily as needed. Patient not taking: Reported on 03/27/2022 09/01/21   Aundra Dubin, PA-C     Critical care time: n/a     Otilio Carpen Luana Tatro, PA-C [facility] See Amion for pager If no response to pager , please call [phone] until 7pm After 7:00 pm call Elink  [phone]?[place]

## 2022-03-14 NOTE — Progress Notes (Signed)
PHARMACIST - PHYSICIAN COMMUNICATION  CONCERNING: Antibiotic IV to Oral Route Change Policy  RECOMMENDATION: This patient is receiving azithromycin by the intravenous route.  Based on criteria approved by the Pharmacy and Therapeutics Committee, the antibiotic(s) is/are being converted to the equivalent oral dose form(s).   DESCRIPTION: These criteria include:  Patient being treated for a respiratory tract infection, urinary tract infection, cellulitis or clostridium difficile associated diarrhea if on metronidazole  The patient is not neutropenic and does not exhibit a GI malabsorption state  The patient is eating (either orally or via tube) and/or has been taking other orally administered medications for a least 24 hours  The patient is improving clinically and has a Tmax < 100.5  If you have questions about this conversion, please contact the Pharmacy Department  []  ( [phone] )  [place] []  ( [phone]  [facility] []  ( [phone]  [place] []  ( [phone]  [facility] [x]  ( [phone] )  [facility]  

## 2022-03-14 NOTE — Progress Notes (Signed)
RT placed patient on heated high flow to maintain current saturation

## 2022-03-14 NOTE — Evaluation (Signed)
Physical Therapy Evaluation Patient Details Name: [name] MRN: [id number] DOB: 12-11-1932 Today's Date: 03/14/2022  History of Present Illness  Pt is an 89yo male presenting to [facility] ED on 1/13 with SOB, hemoptysis. Found to have acute on chronic respiratory failure with hypoxia secondary to multifocal pneumonia  PMH: gait abnormality, anemia, AAA, afib, CAD s/p stent, COPD on 4L at baseline, GERD, hypothyroidism, neuropathy, b/l THA.   Clinical Impression  Pt received supine in bed reporting no pain, had several bouts of hemoptysis during the session, RN notified. Pt required min assist for bed mobility and min guard for transfers. Pt reporting some dizziness or "wooziness" with sitting upright at EOB and standing but no other symptomatic report. HR did elevate to the 140s during mobility but quickly returned, pt reporting he has been told his "heart runs fast," RN notified of HR, see remainder of vitals below. Recommend return to assisted living facility with HHPT if available, pt has recommended DME in good condition at home. We will continue to follow acutely.    Vitals supine prior to mobility: HR116, SpO2 95% on 10LviaHFNC, RR31, BP 95/66 Vitals sitting EOB: HR120, SpO2 93% on 10L, RR30, BP 93/70.  Standing BP: 92/65, pt reporting "wooziness" but elected to continue. Vitals in Recliner at end of session: HR 124, SpO2 93% on 10L, RR29, BP 97/51.        Recommendations for follow up therapy are one component of a multi-disciplinary discharge planning process, led by the attending physician.  Recommendations may be updated based on patient status, additional functional criteria and insurance authorization.  Follow Up Recommendations Home health PT      Assistance Recommended at Discharge Set up Supervision/Assistance  Patient can return home with the following  A little help with walking and/or transfers;A little help with bathing/dressing/bathroom;Assistance with  cooking/housework;Assist for transportation;Help with stairs or ramp for entrance;Direct supervision/assist for medications management;Direct supervision/assist for financial management    Equipment Recommendations None recommended by PT (Pt has recommended DME)  Recommendations for Other Services       Functional Status Assessment Patient has had a recent decline in their functional status and demonstrates the ability to make significant improvements in function in a reasonable and predictable amount of time.     Precautions / Restrictions Precautions Precautions: Fall Precaution Comments: watch BP and O2 Restrictions Weight Bearing Restrictions: No      Mobility  Bed Mobility Overal bed mobility: Needs Assistance Bed Mobility: Supine to Sit     Supine to sit: Min assist, HOB elevated     General bed mobility comments: For trunk elevation, otherwise min guard    Transfers Overall transfer level: Needs assistance Equipment used: Rolling walker (2 wheels) Transfers: Sit to/from Stand, Bed to chair/wheelchair/BSC Sit to Stand: Min guard, From elevated surface   Step pivot transfers: Min guard       General transfer comment: Min guard for safety, no physical assist required or overt LOB noted. Pt able to stand for BP.    Ambulation/Gait                  Stairs            Wheelchair Mobility    Modified Rankin (Stroke Patients Only)       Balance Overall balance assessment: Needs assistance Sitting-balance support: Feet supported, No upper extremity supported Sitting balance-Leahy Scale: Good     Standing balance support: Reliant on assistive device for balance, During functional activity, Single extremity  supported Standing balance-Leahy Scale: Fair                               Pertinent Vitals/Pain Pain Assessment Pain Assessment: No/denies pain    Home Living Family/patient expects to be discharged to:: Assisted living                  Home Equipment: Cane - single point;Rollator (4 wheels);Rolling Walker (2 wheels);Grab bars - tub/shower      Prior Function Prior Level of Function : Independent/Modified Independent             Mobility Comments: Rollator to ambulate to dining room, uses SPC and RW ADLs Comments: independent with ADLs     Hand Dominance   Dominant Hand: Right    Extremity/Trunk Assessment   Upper Extremity Assessment Upper Extremity Assessment: Generalized weakness;RUE deficits/detail;LUE deficits/detail RUE Deficits / Details: MMT grossly 4/5 with average grip strength RUE Sensation: WNL LUE Deficits / Details: MMT grossly 4/5 with average grip strength LUE Sensation: WNL    Lower Extremity Assessment Lower Extremity Assessment: RLE deficits/detail;LLE deficits/detail RLE Deficits / Details: MMT hip/knee/ankle 5/5 RLE Sensation: WNL LLE Deficits / Details: MMT hip/knee/ankle 5/5 LLE Sensation: WNL    Cervical / Trunk Assessment Cervical / Trunk Assessment: Kyphotic  Communication   Communication: No difficulties  Cognition Arousal/Alertness: Awake/alert Behavior During Therapy: WFL for tasks assessed/performed Overall Cognitive Status: Impaired/Different from baseline Area of Impairment: Orientation                 Orientation Level: Time, Situation             General Comments: Some confusion / disorientation to time but otherwise alert and pleasant        General Comments      Exercises     Assessment/Plan    PT Assessment Patient needs continued PT services  PT Problem List Decreased activity tolerance;Decreased balance;Decreased mobility;Cardiopulmonary status limiting activity       PT Treatment Interventions DME instruction;Gait training;Stair training;Functional mobility training;Therapeutic activities;Therapeutic exercise;Balance training;Neuromuscular re-education;Patient/family education    PT Goals (Current goals can be  found in the Care Plan section)  Acute Rehab PT Goals Patient Stated Goal: To go home PT Goal Formulation: With patient Time For Goal Achievement: 03/28/22 Potential to Achieve Goals: Good    Frequency Min 3X/week     Co-evaluation               AM-PAC PT "6 Clicks" Mobility  Outcome Measure Help needed turning from your back to your side while in a flat bed without using bedrails?: None Help needed moving from lying on your back to sitting on the side of a flat bed without using bedrails?: A Little Help needed moving to and from a bed to a chair (including a wheelchair)?: A Little Help needed standing up from a chair using your arms (e.g., wheelchair or bedside chair)?: A Little Help needed to walk in hospital room?: A Little Help needed climbing 3-5 steps with a railing? : A Lot 6 Click Score: 18    End of Session Equipment Utilized During Treatment: Gait belt;Oxygen (10L via [place]) Activity Tolerance: Patient limited by fatigue;Patient tolerated treatment well (some dizziness,) Patient left: in bed;with call bell/phone within reach;with chair alarm set Nurse Communication: Mobility status PT Visit Diagnosis: Difficulty in walking, not elsewhere classified (R26.2)    Time: 1030-1100 PT Time Calculation (min) (ACUTE ONLY): 30  min   Charges:   PT Evaluation $PT Eval Low Complexity: 1 Low PT Treatments $Therapeutic Activity: 8-22 mins        Coolidge Breeze, PT, DPT [facility] Rehabilitation Department Office: [phone] Weekend pager: [phone]  Coolidge Breeze 03/14/2022, 11:22 AM

## 2022-03-15 ENCOUNTER — Inpatient Hospital Stay (HOSPITAL_COMMUNITY): Payer: Medicare Other

## 2022-03-15 DIAGNOSIS — I5033 Acute on chronic diastolic (congestive) heart failure: Secondary | ICD-10-CM | POA: Diagnosis not present

## 2022-03-15 DIAGNOSIS — J9601 Acute respiratory failure with hypoxia: Secondary | ICD-10-CM | POA: Diagnosis not present

## 2022-03-15 DIAGNOSIS — D638 Anemia in other chronic diseases classified elsewhere: Secondary | ICD-10-CM | POA: Diagnosis not present

## 2022-03-15 DIAGNOSIS — J189 Pneumonia, unspecified organism: Secondary | ICD-10-CM | POA: Diagnosis not present

## 2022-03-15 LAB — COMPREHENSIVE METABOLIC PANEL
ALT: 33 U/L (ref 0–44)
AST: 30 U/L (ref 15–41)
Albumin: 2.2 g/dL — ABNORMAL LOW (ref 3.5–5.0)
Alkaline Phosphatase: 42 U/L (ref 38–126)
Anion gap: 6 (ref 5–15)
BUN: 22 mg/dL (ref 8–23)
CO2: 24 mmol/L (ref 22–32)
Calcium: 7.6 mg/dL — ABNORMAL LOW (ref 8.9–10.3)
Chloride: 106 mmol/L (ref 98–111)
Creatinine, Ser: 1.11 mg/dL (ref 0.61–1.24)
GFR, Estimated: >60 mL/min (ref >60)
Glucose, Bld: 93 mg/dL (ref 70–99)
Potassium: 3.4 mmol/L — ABNORMAL LOW (ref 3.5–5.1)
Sodium: 136 mmol/L (ref 135–145)
Total Bilirubin: 0.8 mg/dL (ref 0.3–1.2)
Total Protein: 5.2 g/dL — ABNORMAL LOW (ref 6.5–8.1)

## 2022-03-15 LAB — CBC WITH DIFFERENTIAL/PLATELET
Abs Immature Granulocytes: 0.7 10*3/uL — ABNORMAL HIGH (ref 0.00–0.07)
Basophils Absolute: 0.2 10*3/uL — ABNORMAL HIGH (ref 0.0–0.1)
Basophils Relative: 2 %
Eosinophils Absolute: 0.6 10*3/uL — ABNORMAL HIGH (ref 0.0–0.5)
Eosinophils Relative: 5 %
HCT: 30.4 % — ABNORMAL LOW (ref 39.0–52.0)
Hemoglobin: 9.3 g/dL — ABNORMAL LOW (ref 13.0–17.0)
Immature Granulocytes: 6 %
Lymphocytes Relative: 9 %
Lymphs Abs: 1 10*3/uL (ref 0.7–4.0)
MCH: 27.9 pg (ref 26.0–34.0)
MCHC: 30.6 g/dL (ref 30.0–36.0)
MCV: 91.3 fL (ref 80.0–100.0)
Monocytes Absolute: 1.4 10*3/uL — ABNORMAL HIGH (ref 0.1–1.0)
Monocytes Relative: 12 %
Neutro Abs: 7.7 10*3/uL (ref 1.7–7.7)
Neutrophils Relative %: 66 %
Platelets: 307 10*3/uL (ref 150–400)
RBC: 3.33 MIL/uL — ABNORMAL LOW (ref 4.22–5.81)
RDW: 18.6 % — ABNORMAL HIGH (ref 11.5–15.5)
WBC: 11.7 10*3/uL — ABNORMAL HIGH (ref 4.0–10.5)
nRBC: 0 % (ref 0.0–0.2)

## 2022-03-15 LAB — LEGIONELLA PNEUMOPHILA SEROGP 1 UR AG: L. pneumophila Serogp 1 Ur Ag: NEGATIVE

## 2022-03-15 LAB — MAGNESIUM: Magnesium: 2.2 mg/dL (ref 1.7–2.4)

## 2022-03-15 LAB — PHOSPHORUS: Phosphorus: 3 mg/dL (ref 2.5–4.6)

## 2022-03-15 LAB — CORTISOL: Cortisol, Plasma: 17.4 ug/dL

## 2022-03-15 MED ORDER — PANTOPRAZOLE SODIUM 40 MG IV SOLR
40.0000 mg | INTRAVENOUS | Status: DC
  Administered 2022-03-16 – 2022-03-17 (×2): 40 mg via INTRAVENOUS
  Filled 2022-03-15 (×3): qty 10

## 2022-03-15 MED ORDER — METHYLPREDNISOLONE SODIUM SUCC 125 MG IJ SOLR
81.2500 mg | Freq: Every day | INTRAMUSCULAR | Status: DC
  Administered 2022-03-15 – 2022-03-16 (×2): 81.25 mg via INTRAVENOUS
  Filled 2022-03-15 (×2): qty 2

## 2022-03-15 MED ORDER — GUAIFENESIN-DM 100-10 MG/5ML PO SYRP
10.0000 mL | ORAL_SOLUTION | ORAL | Status: DC | PRN
  Administered 2022-03-19 (×2): 10 mL via ORAL
  Filled 2022-03-15 (×2): qty 10

## 2022-03-15 MED ORDER — BENZONATATE 100 MG PO CAPS: 200.0000 mg | ORAL_CAPSULE | Freq: Three times a day (TID) | ORAL | Status: DC | PRN

## 2022-03-15 MED ORDER — SODIUM CHLORIDE 0.9 % IV SOLN
500.0000 mg | INTRAVENOUS | Status: AC
  Administered 2022-03-16 – 2022-03-17 (×2): 500 mg via INTRAVENOUS
  Filled 2022-03-15 (×2): qty 5

## 2022-03-15 MED ORDER — SODIUM CHLORIDE 0.9 % IV SOLN: 500.0000 mg | INTRAVENOUS | Status: DC

## 2022-03-15 MED ORDER — FAMOTIDINE IN NACL 20-0.9 MG/50ML-% IV SOLN
20.0000 mg | Freq: Once | INTRAVENOUS | Status: AC
  Administered 2022-03-15: 20 mg via INTRAVENOUS
  Filled 2022-03-15: qty 50

## 2022-03-15 MED ORDER — METRONIDAZOLE 500 MG/100ML IV SOLN
500.0000 mg | Freq: Two times a day (BID) | INTRAVENOUS | Status: DC
  Administered 2022-03-15 – 2022-03-18 (×8): 500 mg via INTRAVENOUS
  Filled 2022-03-15 (×9): qty 100

## 2022-03-15 MED ORDER — POTASSIUM CHLORIDE CRYS ER 20 MEQ PO TBCR
40.0000 meq | EXTENDED_RELEASE_TABLET | Freq: Once | ORAL | Status: AC
  Administered 2022-03-15: 40 meq via ORAL
  Filled 2022-03-15: qty 2

## 2022-03-15 NOTE — Progress Notes (Signed)
[facility] ADULT ICU REPLACEMENT PROTOCOL   The patient does apply for the [facility] Adult ICU Electrolyte Replacment Protocol based on the criteria listed below:   1.Exclusion criteria: TCTS, ECMO, Dialysis, and Myasthenia Gravis patients 2. Is GFR >/= 30 ml/min? Yes.    Patient's GFR today is >60 3. Is SCr </= 2? Yes.   Patient's SCr is 1.11 mg/dL 4. Did SCr increase >/= 0.5 in 24 hours? No. 5.Pt's weight >40kg  Yes.   6. Abnormal electrolyte(s): potassium 3.4  7. Electrolytes replaced per protocol 8.  Call MD STAT for K+ </= 2.5, Phos </= 1, or Mag </= 1 Physician:  n/a  [name] 03/15/2022 5:41 AM

## 2022-03-15 NOTE — Progress Notes (Signed)
[facility]                                                                               [name], is a 89 y.o. male, DOB - 11/12/1932, ASN:[id number] Admit date - 03/23/2022    Outpatient Primary MD for the patient is Dettinger, [name], MD  LOS - 3  days    Brief summary   [name] is a 89 y.o. male with medical history significant of Ascending aorta aneurysm, atrial fibrillation, CAD, GERD, COPD, depression, presents to ED for sob , blood tinged  productive sputum since few weeks. Patient is a poor historian and no family at bedside. He was recently admitted for RSV bronchitis, and discharged to ALF. He is chronically on  4 lit of [place] oxygen. H presents this admission for sob and an episode of hemoptysis and worsening pedal edema. He was admitted for acute on chronic respiratory failure with hypoxia sec to combination of pneumonia and CHF.  Hospital course complicated by aspiration and vomiting, requiring heated high flow oxygen. He was transferred to stepdown for closer monitoring. PCCM consulted for recommendations. Advanced directives in place with DNR/DNI. Waiting for clinical improvement.    Assessment & Plan    Assessment and Plan:  Acute on chronic respiratory failure with hypoxia secondary to a combination of multifocal pneumonia and mild acute on chronic diastolic heart failure Yesterday patient started vomiting and appears to have aspirated requiring heated HF oxygen 35L and 75% of Fio2. Repeat CXR  the evening of 03/14/22 shows Persistent interstitial and airspace opacities in the lungs bilaterally He has a history of chronic bronchiectasis with productive sputum He was started on broad-spectrum IV antibiotics, after discussing with pccm, added azithromycin for atypical coverage and metronidazole for aspiration pneumonia. Vancomycin discontinued as MRSA pcr is engative.  Sputum culture sent and pending. Patient's COVID PCR, influenza PCR and RVP,  strep pneumonia have been negative. Legionella antigen is negative. Meanwhile continue with bronchodilators, flutter valve and hypertonic saline nebs twice a day. Changed most of the meds to IV, .  SLP evaluation ordered.  Appreciate PCCM assistance.    Mild acute on chronic diastolic heart failure BNP elevated, pedal edema present on admission. He was started on IV lasix 20 mg BID, then stopped due to rise in creatinine and soft BP parameters.  Last echocardiogram showed preserved left ventricular ejection fraction and diastolic dysfunction cannot be assessed. Continue with strict intake and output.  Daily weights.    Hypotension:  Suspect from over diuresis vs sepsis. 1.5 lit fluid bolus given, BP parameters improved.  Lactic acid wnl.  PCCM consulted for vasopressors,.     Paroxysmal atrial fibrillation Currently rate uncontrolled, running between 110-120 probably triggered by respiratory illness Not on anticoagulation due to GI bleed    Hypothyroidism Continue with Synthroid    History of coronary artery disease Troponins negative Patient denies any chest pain   GERD Continue with PPI   Hypokalemia Replaced, recheck in am.    Anemia of chronic disease Hemoglobin between 19 to 11.  Anemia panel showing low iron . Supplementation on discharge.    Estimated body mass index is 23.84  kg/m as calculated from the following:   Height as of this encounter: '5\' 6"'$  (1.676 m).   Weight as of this encounter: 67 kg.  Code Status: DNR DVT Prophylaxis:  enoxaparin (LOVENOX) injection 40 mg Start: 03/03/2022 2200   Level of Care: Level of care: Stepdown Family Communication: Updated family over the phone.   Disposition Plan:     Remains inpatient appropriate: Broad-spectrum IV antibiotics  Procedures:  None  Consultants:   PCCM.   Antimicrobials:   Anti-infectives (From admission, onward)    Start     Dose/Rate Route Frequency Ordered Stop   03/15/22 1200   azithromycin (ZITHROMAX) 500 mg in sodium chloride 0.9 % 250 mL IVPB        500 mg 250 mL/hr over 60 Minutes Intravenous Every 24 hours 03/15/22 0944     03/15/22 1000  azithromycin (ZITHROMAX) tablet 500 mg  Status:  Discontinued        500 mg Oral Daily 03/14/22 1142 03/15/22 0944   03/15/22 1000  metroNIDAZOLE (FLAGYL) IVPB 500 mg        500 mg 100 mL/hr over 60 Minutes Intravenous Every 12 hours 03/15/22 0906 03/20/22 0959   03/13/22 1130  azithromycin (ZITHROMAX) 500 mg in sodium chloride 0.9 % 250 mL IVPB  Status:  Discontinued        500 mg 250 mL/hr over 60 Minutes Intravenous Daily 03/13/22 1126 03/14/22 1142   03/25/2022 1830  vancomycin (VANCOCIN) IVPB 1000 mg/200 mL premix  Status:  Discontinued        1,000 mg 200 mL/hr over 60 Minutes Intravenous Every 24 hours 03/10/2022 1740 03/14/22 1147   03/17/2022 1800  ceFEPIme (MAXIPIME) 2 g in sodium chloride 0.9 % 100 mL IVPB        2 g 200 mL/hr over 30 Minutes Intravenous Every 12 hours 03/11/2022 1740          Medications  Scheduled Meds:  Chlorhexidine Gluconate Cloth  6 each Topical Daily   enoxaparin (LOVENOX) injection  40 mg Subcutaneous Q24H   feeding supplement  237 mL Oral Q24H   hydrocortisone cream   Topical TID   lactose free nutrition  237 mL Oral Q24H   levothyroxine  75 mcg Oral Q0600   midodrine  5 mg Oral TID WC   pantoprazole (PROTONIX) IV  40 mg Intravenous Q24H   sodium chloride HYPERTONIC  4 mL Nebulization BID   sucralfate  1 g Oral TID WC & HS   Continuous Infusions:  sodium chloride     azithromycin     ceFEPime (MAXIPIME) IV Stopped (03/15/22 3810)   metronidazole     norepinephrine (LEVOPHED) Adult infusion 2 mcg/min (03/15/22 0645)   sodium chloride     PRN Meds:.acetaminophen, benzonatate, diphenhydrAMINE-zinc acetate, guaiFENesin-dextromethorphan, ipratropium, levalbuterol, ondansetron (ZOFRAN) IV, mouth rinse, sodium chloride    Subjective:   [name] was seen and examined today.   Vomited most of the pills this morning.  Coughing , dyspneic.   Objective:   Vitals:   03/15/22 0746 03/15/22 0800 03/15/22 0900 03/15/22 1000  BP:  (!) 116/58    Pulse: 88 87 85 (!) 107  Resp: (!) 26 (!) '24 13 19  '$ Temp: 98 F (36.7 C)     TempSrc: Oral     SpO2: 98% 98% 94% 94%  Weight:      Height:        Intake/Output Summary (Last 24 hours) at 03/15/2022 1101 Last data filed at 03/15/2022 [phone]  Gross per 24 hour  Intake 1260.76 ml  Output 400 ml  Net 860.76 ml    Filed Weights   03/13/22 1500 03/14/22 0500  Weight: 70.3 kg 67 kg     Exam General exam: Elderly gentleman, ill appearing in mild distress.  Respiratory system: bilateral rhonchi and wheezing. Tachypnea, on HHF oxygen.  Cardiovascular system: S1 & S2 heard, irregularly irregular, no JVd, pedal edema resolved.  Gastrointestinal system: Abdomen is nondistended, soft and nontender.  Central nervous system: Alert and oriented to lace and person, grossly non focal.  Extremities: No pedal edema.  Skin: No rashes,  Psychiatry: pleasant gentleman, with appropriate mood and affect.      Data Reviewed:  I have personally reviewed following labs and imaging studies   CBC Lab Results  Component Value Date   WBC 11.7 (H) 03/15/2022   RBC 3.33 (L) 03/15/2022   HGB 9.3 (L) 03/15/2022   HCT 30.4 (L) 03/15/2022   MCV 91.3 03/15/2022   MCH 27.9 03/15/2022   PLT 307 03/15/2022   MCHC 30.6 03/15/2022   RDW 18.6 (H) 03/15/2022   LYMPHSABS 1.0 03/15/2022   MONOABS 1.4 (H) 03/15/2022   EOSABS 0.6 (H) 03/15/2022   BASOSABS 0.2 (H) 24/23/5361     Last metabolic panel Lab Results  Component Value Date   NA 136 03/15/2022   K 3.4 (L) 03/15/2022   CL 106 03/15/2022   CO2 24 03/15/2022   BUN 22 03/15/2022   CREATININE 1.11 03/15/2022   GLUCOSE 93 03/15/2022   GFRNONAA >60 03/15/2022   GFRAA 74 04/22/2020   CALCIUM 7.6 (L) 03/15/2022   PHOS 3.0 03/15/2022   PROT 5.2 (L) 03/15/2022   ALBUMIN 2.2 (L)  03/15/2022   LABGLOB 2.3 12/17/2021   AGRATIO 1.7 12/17/2021   BILITOT 0.8 03/15/2022   ALKPHOS 42 03/15/2022   AST 30 03/15/2022   ALT 33 03/15/2022   ANIONGAP 6 03/15/2022    CBG (last 3)  No results for input(s): "GLUCAP" in the last 72 hours.    Coagulation Profile: No results for input(s): "INR", "PROTIME" in the last 168 hours.   Radiology Studies: DG CHEST PORT 1 VIEW  Result Date: 03/14/2022 CLINICAL DATA:  Dyspnea. EXAM: PORTABLE CHEST 1 VIEW COMPARISON:  03/14/2022. FINDINGS: The heart is enlarged and the mediastinal contour is stable. There is atherosclerotic calcification of the aorta. There are persistent interstitial and airspace opacities in the lungs bilaterally, not significantly changed from the prior exam. There are small bilateral pleural effusions. No pneumothorax. No acute osseous abnormality. IMPRESSION: 1. Persistent interstitial and airspace opacities in the lungs bilaterally, not significantly changed from the prior exam. 2. Small bilateral pleural effusions. Electronically Signed   By: Brett Fairy M.D.   On: 03/14/2022 22:58   DG CHEST PORT 1 VIEW  Result Date: 03/14/2022 CLINICAL DATA:  Hypoxia EXAM: PORTABLE CHEST 1 VIEW COMPARISON:  03/22/2022 FINDINGS: Single frontal view of the chest demonstrates stable enlargement of the cardiac silhouette. There is multifocal bilateral airspace disease, with a waxing and waning appearance since prior study. Slight clearing of the consolidation in the left midlung zone, with increased airspace disease in the left upper lobe and right perihilar regions. Persistent dense left basilar consolidation and likely underlying small pleural effusion. No pneumothorax. IMPRESSION: 1. Waxing and waning bilateral multifocal airspace disease, with overall slight progression since prior study, consistent with worsening edema or infection. 2. Stable left pleural effusion. Electronically Signed   By: Randa Ngo M.D.   On:  03/14/2022  15:46   [place] EKG SITE RITE  Result Date: 03/14/2022 If Site Rite image not attached, placement could not be confirmed due to current cardiac rhythm.      Hosie Poisson M.D. [facility] 03/15/2022, 11:01 AM  Available via Epic secure chat 7am-7pm After 7 pm, please refer to night coverage provider listed on amion.

## 2022-03-15 NOTE — Progress Notes (Signed)
Pharmacy Antibiotic Note  [name] is a 89 y.o. male admitted on 03/10/2022 with multifocal PNA.   03/15/2022 D#4 abx.  D#3/5 azith. D#4 cefepime; D1/5 Flagyl  SCr 1.11, WBC 11.7  Plan: Continue Cefepime 2g IV q12h Azithromycin 500 mg IV q24 - ends 1/18 Flagyl 500 mg IV q12 - ends 1/20  Follow up renal function & cultures Height: '5\' 6"'$  (167.6 cm) Weight: 67 kg (147 lb 11.3 oz) IBW/kg (Calculated) : 63.8  Temp (24hrs), Avg:97.9 F (36.6 C), Min:97.6 F (36.4 C), Max:98 F (36.7 C)  Recent Labs  Lab 03/10/22 0956 03/13/2022 1147 03/13/22 0512 03/14/22 0247 03/14/22 1652 03/14/22 1907 03/15/22 0249  WBC 11.9* 12.3* 12.7* 12.1*  --   --  11.7*  CREATININE 1.12 1.16 1.19 1.47*  --   --  1.11  LATICACIDVEN  --   --   --   --  1.1 1.0  --      Estimated Creatinine Clearance: 42.3 mL/min (by C-G formula based on SCr of 1.11 mg/dL).    Allergies  Allergen Reactions   Gadavist [Gadobutrol] Hives   Prednisone Other (See Comments)    Agitation and hallucinations   Betadine [Povidone Iodine] Other (See Comments)    Blisters    Lortab [Hydrocodone-Acetaminophen] Nausea And Vomiting   Penicillins Other (See Comments)    Lightheadedness  Has patient had a PCN reaction causing immediate rash, facial/tongue/throat swelling, SOB or lightheadedness with hypotension: Yes Has patient had a PCN reaction causing severe rash involving mucus membranes or skin necrosis: No Has patient had a PCN reaction that required hospitalization No Has patient had a PCN reaction occurring within the last 10 years: No If all of the above answers are "NO", then may proceed with Cephalosporin use.   Zetia [Ezetimibe] Other (See Comments)    Muscle weakness    Zocor [Simvastatin] Nausea Only and Other (See Comments)    Muscle weakness     Antimicrobials this admission: 1/13 Vanc >> 1/15 1/13 Cefepime >> 1/14 Azithro >> (1/18) 1/16 Flagyl >>  ( 1/20) Dose adjustments this  admission:  Microbiology results: 1/13 MRSA PCR: neg 1/13 RVP: neg 1/13 Urine strep Ag: neg 1/13 Urine legionella Ag: IP 1/14 sputum: few GNR on gram stain & sq epithelial cells  Thank you for allowing pharmacy to be a part of this patient's care.  Eudelia Bunch, Pharm.D Use secure chat for questions 03/15/2022 1:10 PM

## 2022-03-15 NOTE — Progress Notes (Signed)
NAME:  [name], MRN:  [id number], DOB:  12-28-1932, LOS: 3 ADMISSION DATE:  03/24/2022, CONSULTATION DATE:  03/15/22 REFERRING MD:  Karleen Hampshire, CHIEF COMPLAINT:  hypotension   History of Present Illness:  [name] is a 89 y.o. M with PMH significant for bronchiectasis-never smoker and followed by Dr. Loanne Drilling on 2-5L home O2, AAA, atrial fibrillation, CAD, Barrett's esophagus, pituitary macroadenoma with recent RSV bronchitis admission and discharged to SNF on  02/10/22 who presented to the ED on 1/13 with several weeks of blood tinged sputum.  He was seen in the pulmonary office four days prior to admission with similar symptoms and prescribed Levaquin and Prednisone.   CXR with diffuse bilateral ground glass opacities and LUL infiltrate consistent with multifocal PNA.    He was admitted and treated with vancomycin and cefepime, covid, flu and RSV and RVP all negative.   His BNP was elevated so was started on Lasix '20mg'$  bid and received metoprolol this morning.  Later in the day his blood pressure down-trended and PCCM consulted.  MAP 55-60, states that his head feels "fuzzy" and he is tired, otherwise is feeling improved.  No worsening tachycardia or fevers, bloodwork this morning was stable with WBC of 12k.  Pertinent  Medical History   has a past medical history of Abnormality of gait (06/07/2013), Anemia, Aneurysm ([place]), Anxiety, Arthritis, Asthma, Atrial fibrillation ([place]), B12 deficiency, Barrett's esophagus, CAD (coronary artery disease) (03/2006), Cancer ([place]), Cataract, COLONIC POLYPS (03/88/8280), Complication of anesthesia, COPD (chronic obstructive pulmonary disease) ([place]), Depression, Diverticulosis, Emphysema, Enlarged prostate with urinary retention, Esophageal stenosis, Gastric ulcer, GERD (gastroesophageal reflux disease), Hiatal hernia, Hypothyroidism, Neuropathy, Osteoporosis, Pituitary macroadenoma (H[facility]), Restless legs syndrome (RLS), Tubular adenoma of colon, and UGIB (upper  gastrointestinal bleed) (03/2013).   Significant Hospital Events: Including procedures, antibiotic start and stop dates in addition to other pertinent events   1/13 admit, started on vanc/cefepime 1/15 PCCM consult, added azithryomycin 1/16 coughing/possible aspiration event overnight, on HFNC and peripheral levophed   Interim History / Subjective:    Up from 10L to 35L HFNC today, though pt states he is feeling about the same  Episodes of coughing after eating On levophed 40mg  Objective   Blood pressure 124/81, pulse 88, temperature 98 F (36.7 C), temperature source Oral, resp. rate (!) 26, height '5\' 6"'$  (1.676 m), weight 67 kg, SpO2 98 %.    FiO2 (%):  [60 %-85 %] 85 %   Intake/Output Summary (Last 24 hours) at 03/15/2022 0801 Last data filed at 03/15/2022 0645 Gross per 24 hour  Intake 1260.76 ml  Output 400 ml  Net 860.76 ml    Filed Weights   03/13/22 1500 03/14/22 0500  Weight: 70.3 kg 67 kg   General:  elderly M, sitting up in bed in no distress watching TV HEENT: MM pink/moist, sclera anicteric Neuro: awake, alert, oriented  CV: s1s2 slightly tachycardic, irregular, no m/r/g PULM:  decreased air entry bilateral bases few no rhonchi, wheezing, tachypnea or accessory muscle use on 35L GI: soft, non-distended  Extremities: warm/dry, no pre-tibial edema  Skin: no rashes or lesions   Resolved Hospital Problem list     Assessment & Plan:    Hypotension MAP 55-60, on review of previous admissions it appears his BP is usually 100/60's  Possibly from beta blocker and diuresis -Received IVF and albumin yesterday -on Levophed 247m with MAP 80, wean to maintain MAP >65 -check random cortisol, pending results start prednisone '40mg'$   Acute on Chronic Hypoxic Respiratory  Failure likely secondary to bronchiectasis exacerbation and multi-focal pneumonia Concern for aspiration Sputum culture pending, viral work-up has been negative Coughing after meals and up to 35L  HFNC today -speech eval -continue cefepime, azithromycin, add Flagyl for anaerobes  -cont. Hypertonic saline nebs and bronchodilators -IS and flutter valve    Best Practice (right click and "Reselect all SmartList Selections" daily)   Per primary Now DNR/DNI  Labs   CBC: Recent Labs  Lab 03/10/22 0956 03/08/2022 1147 03/13/22 0512 03/14/22 0247 03/15/22 0249  WBC 11.9* 12.3* 12.7* 12.1* 11.7*  NEUTROABS 9.3*  --  8.9* 8.2* 7.7  HGB 10.7* 9.9* 10.4* 11.6* 9.3*  HCT 33.8* 32.3* 33.8* 37.3* 30.4*  MCV 88.8 92.3 90.6 91.2 91.3  PLT 267.0 271 301 338 307     Basic Metabolic Panel: Recent Labs  Lab 03/10/22 0956 03/26/2022 1147 03/13/22 0512 03/14/22 0247 03/15/22 0249  NA 141 138 136 138 136  K 4.3 3.8 3.2* 3.2* 3.4*  CL 108 109 104 102 106  CO2 23 20* '22 26 24  '$ GLUCOSE 106* 95 90 95 93  BUN '19 21 23 '$ 24* 22  CREATININE 1.12 1.16 1.19 1.47* 1.11  CALCIUM 8.2* 7.7* 7.9* 8.1* 7.6*  MG  --   --   --  2.3 2.2  PHOS  --   --   --  4.8* 3.0    GFR: Estimated Creatinine Clearance: 42.3 mL/min (by C-G formula based on SCr of 1.11 mg/dL). Recent Labs  Lab 03/20/2022 1147 03/13/22 0512 03/14/22 0247 03/14/22 1652 03/14/22 1907 03/15/22 0249  WBC 12.3* 12.7* 12.1*  --   --  11.7*  LATICACIDVEN  --   --   --  1.1 1.0  --      Liver Function Tests: Recent Labs  Lab 03/14/22 0247 03/15/22 0249  AST 53* 30  ALT 45* 33  ALKPHOS 56 42  BILITOT 0.9 0.8  PROT 6.2* 5.2*  ALBUMIN 2.6* 2.2*    No results for input(s): "LIPASE", "AMYLASE" in the last 168 hours. No results for input(s): "AMMONIA" in the last 168 hours.  ABG    Component Value Date/Time   TCO2 18 (L) 12/04/2021 0742     Coagulation Profile: No results for input(s): "INR", "PROTIME" in the last 168 hours.  Cardiac Enzymes: No results for input(s): "CKTOTAL", "CKMB", "CKMBINDEX", "TROPONINI" in the last 168 hours.  HbA1C: No results found for: "HGBA1C"  CBG: No results for input(s): "GLUCAP"  in the last 168 hours.  Review of Systems:   Please see the history of present illness. All other systems reviewed and are negative    Past Medical History:  He,  has a past medical history of Abnormality of gait (06/07/2013), Anemia, Aneurysm ([place]), Anxiety, Arthritis, Asthma, Atrial fibrillation ([place]), B12 deficiency, Barrett's esophagus, CAD (coronary artery disease) (03/2006), Cancer ([place]), Cataract, COLONIC POLYPS (40/98/1191), Complication of anesthesia, COPD (chronic obstructive pulmonary disease) ([place]), Depression, Diverticulosis, Emphysema, Enlarged prostate with urinary retention, Esophageal stenosis, Gastric ulcer, GERD (gastroesophageal reflux disease), Hiatal hernia, Hypothyroidism, Neuropathy, Osteoporosis, Pituitary macroadenoma ([facility]), Restless legs syndrome (RLS), Tubular adenoma of colon, and UGIB (upper gastrointestinal bleed) (03/2013).   Surgical History:   Past Surgical History:  Procedure Laterality Date   ABDOMINAL HERNIA REPAIR   2008   BIOPSY  06/13/2020   Procedure: BIOPSY;  Surgeon: Harvel Quale, MD;  [facility];  Service: Gastroenterology;;  gastric   BIOPSY  12/04/2021   Procedure: BIOPSY;  Surgeon: Juanita Craver, MD;  Location:  [facility];  Service: Gastroenterology;;   CARDIAC CATHETERIZATION  01/2007   L main 30%, oLAD 50%,  pLAD stent ok, CFX 80%, OM 60%, pRCA 60%, mRCA 70%, oPDA 90%; med rx   cataract extraction both eyes     COLONOSCOPY     COLONOSCOPY WITH PROPOFOL N/A 06/13/2020   Procedure: COLONOSCOPY WITH PROPOFOL;  Surgeon: Harvel Quale, MD;  [facility];  Service: Gastroenterology;  Laterality: N/A;   COLONOSCOPY WITH PROPOFOL N/A 12/04/2021   Procedure: COLONOSCOPY WITH PROPOFOL;  Surgeon: Juanita Craver, MD;  [facility];  Service: Gastroenterology;  Laterality: N/A;   CORONARY ANGIOPLASTY     CORONARY STENT PLACEMENT  03/2006   3.0 x 20 mm TAXUS [place] DES to the LAD   CYSTOSCOPY N/A  06/10/2015   Procedure: CYSTOSCOPY FULGRATION OF BLEEDING,  electovapor resection;  Surgeon: Irine Seal, MD;  [facility];  Service: Urology;  Laterality: N/A;   CYSTOSCOPY WITH INSERTION OF UROLIFT N/A 06/01/2015   Procedure: CYSTOSCOPY WITH INSERTION OF UROLIFT x4;  Surgeon: Irine Seal, MD;  [facility];  Service: Urology;  Laterality: N/A;   ESOPHAGOGASTRODUODENOSCOPY N/A 04/20/2013   Procedure: ESOPHAGOGASTRODUODENOSCOPY (EGD);  Surgeon: Inda Castle, MD;  [facility];  Service: Endoscopy;  Laterality: N/A;   ESOPHAGOGASTRODUODENOSCOPY N/A 03/25/2016   Procedure: ESOPHAGOGASTRODUODENOSCOPY (EGD);  Surgeon: Irene Shipper, MD;  [facility];  Service: Endoscopy;  Laterality: N/A;   ESOPHAGOGASTRODUODENOSCOPY (EGD) WITH PROPOFOL N/A 10/13/2015   Procedure: ESOPHAGOGASTRODUODENOSCOPY (EGD) WITH PROPOFOL;  Surgeon: Jerene Bears, MD;  [facility];  Service: Gastroenterology;  Laterality: N/A;   ESOPHAGOGASTRODUODENOSCOPY (EGD) WITH PROPOFOL N/A 06/13/2020   Procedure: ESOPHAGOGASTRODUODENOSCOPY (EGD) WITH PROPOFOL;  Surgeon: Harvel Quale, MD;  [facility];  Service: Gastroenterology;  Laterality: N/A;   ESOPHAGOGASTRODUODENOSCOPY (EGD) WITH PROPOFOL N/A 10/02/2021   Procedure: ESOPHAGOGASTRODUODENOSCOPY (EGD) WITH PROPOFOL;  Surgeon: Daryel November, MD;  [facility];  Service: Gastroenterology;  Laterality: N/A;   ESOPHAGOGASTRODUODENOSCOPY (EGD) WITH PROPOFOL N/A 12/03/2021   Procedure: ESOPHAGOGASTRODUODENOSCOPY (EGD) WITH PROPOFOL;  Surgeon: Ladene Artist, MD;  [facility];  Service: Gastroenterology;  Laterality: N/A;   FOOT SURGERY  1994 left, 2002 right foot   bilateral foot reconstruciton   FOOT SURGERY     reconstruction of both feet- no retained hardware.   HERNIA REPAIR     HIATAL HERNIA REPAIR  01-04-2008   HOT HEMOSTASIS N/A 10/02/2021   Procedure: HOT HEMOSTASIS (ARGON PLASMA COAGULATION/BICAP);   Surgeon: Daryel November, MD;  [facility];  Service: Gastroenterology;  Laterality: N/A;   JOINT REPLACEMENT     MELANOMA EXCISION  2019   POLYPECTOMY     POLYPECTOMY  06/13/2020   Procedure: POLYPECTOMY;  Surgeon: Harvel Quale, MD;  [facility];  Service: Gastroenterology;;  ascending,    POLYPECTOMY  12/04/2021   Procedure: POLYPECTOMY;  Surgeon: Juanita Craver, MD;  [facility];  Service: Gastroenterology;;   PROSTATE SURGERY     x 2   SAVORY DILATION N/A 03/25/2016   Procedure: SAVORY DILATION;  Surgeon: Irene Shipper, MD;  [facility];  Service: Endoscopy;  Laterality: N/A;   TOTAL HIP ARTHROPLASTY  07/21/2011   Procedure: TOTAL HIP ARTHROPLASTY ANTERIOR APPROACH;  Surgeon: Mauri Pole, MD;  [facility];  Service: Orthopedics;  Laterality: Left;   TOTAL HIP ARTHROPLASTY Right 09/07/2012   Procedure: RIGHT TOTAL HIP ARTHROPLASTY ANTERIOR APPROACH;  Surgeon: Mcarthur Rossetti, MD;  [facility];  Service: Orthopedics;  Laterality: Right;   TRANSURETHRAL RESECTION OF BLADDER NECK N/A 11/04/2015   Procedure: RESECTION OF BLADDER NECK;  Surgeon: Cleon Gustin, MD;  [facility];  Service: Urology;  Laterality: N/A;   TRANSURETHRAL RESECTION OF PROSTATE N/A 11/04/2015   Procedure: TRANSURETHRAL RESECTION OF THE PROSTATE (TURP); REMOVAL OF UROLIFT IMPLANTS X THREE;  Surgeon: Cleon Gustin, MD;  [facility];  Service: Urology;  Laterality: N/A;   UPPER GASTROINTESTINAL ENDOSCOPY  12/2013   Dr Hilarie Fredrickson, gastritis   VIDEO BRONCHOSCOPY Bilateral 02/01/2017   Procedure: VIDEO BRONCHOSCOPY WITHOUT FLUORO;  Surgeon: Tanda Rockers, MD;  [facility];  Service: Cardiopulmonary;  Laterality: Bilateral;     Social History:   reports that he has never smoked. He has never used smokeless tobacco. He reports that he does not drink alcohol and does not use drugs.   Family History:  His family history includes  Heart disease in his sister; Heart failure in his father; Other in his mother. There is no history of Colon cancer, Esophageal cancer, Rectal cancer, Stomach cancer, or Colon polyps.   Allergies Allergies  Allergen Reactions   Gadavist [Gadobutrol] Hives   Prednisone Other (See Comments)    Agitation and hallucinations   Betadine [Povidone Iodine] Other (See Comments)    Blisters    Lortab [Hydrocodone-Acetaminophen] Nausea And Vomiting   Penicillins Other (See Comments)    Lightheadedness  Has patient had a PCN reaction causing immediate rash, facial/tongue/throat swelling, SOB or lightheadedness with hypotension: Yes Has patient had a PCN reaction causing severe rash involving mucus membranes or skin necrosis: No Has patient had a PCN reaction that required hospitalization No Has patient had a PCN reaction occurring within the last 10 years: No If all of the above answers are "NO", then may proceed with Cephalosporin use.   Zetia [Ezetimibe] Other (See Comments)    Muscle weakness    Zocor [Simvastatin] Nausea Only and Other (See Comments)    Muscle weakness      Home Medications  Prior to Admission medications   Medication Sig Start Date End Date Taking? Authorizing Provider  albuterol (PROVENTIL) (2.5 MG/3ML) 0.083% nebulizer solution Take 3 mLs (2.5 mg total) by nebulization every 4 (four) hours as needed for wheezing or shortness of breath. 03/10/22  Yes Martyn Ehrich, NP  budesonide-formoterol [facility]) 160-4.5 MCG/ACT inhaler Inhale 2 puffs into the lungs in the morning and at bedtime. 03/05/21  Yes Margaretha Seeds, MD  cabergoline (DOSTINEX) 0.5 MG tablet Take 1 tablet (0.5 mg total) by mouth 2 (two) times a week. 01/13/22  Yes Nida, Marella Chimes, MD  dextromethorphan-guaiFENesin (MUCINEX DM) 30-600 MG 12hr tablet Take 1 tablet by mouth 2 (two) times daily. 03/10/22  Yes Martyn Ehrich, NP  furosemide (LASIX) 20 MG tablet Take 1 tablet daily x 5 days Patient  taking differently: Take 20 mg by mouth daily. Start date 03/24/2022- Take 20 mg by mouth once a day for 5 days 03/11/22  Yes Martyn Ehrich, NP  GAVILAX 17 GM/SCOOP powder DISSOLVE 17 GM SCOOP IN 8 OZ. OF WATER AND DRINK ONCE DAILY AS NEEDED FOR CONSTIPATION Patient taking differently: Take 17 g by mouth daily as needed for mild constipation (mixed into 4-8 ounces of water). 06/29/20  Yes Pyrtle, Lajuan Lines, MD  levalbuterol [facility] HFA) 45 MCG/ACT inhaler Inhale 1 puff into the lungs every 4 (four) hours as needed for wheezing. 12/06/21 12/06/22 Yes Dahal, Marlowe Aschoff, MD  levofloxacin (LEVAQUIN) 500 MG tablet Take 1 tablet (500 mg total) by mouth daily. Patient taking differently: Take 500 mg by mouth See admin instructions. Start date 03/11/2022- Take 500 mg by mouth once a day for 10 days 03/10/22  Yes Martyn Ehrich, NP  levothyroxine (SYNTHROID) 75 MCG tablet Take 1 tablet (75 mcg total) by mouth daily before breakfast. 12/01/21  Yes Dettinger, [name], MD  metoprolol succinate (TOPROL-XL) 50 MG 24 hr tablet Take 1 tablet (50 mg total) by mouth daily. Take with or immediately following a meal. 02/15/22  Yes Hassell Done, Mary-Margaret, FNP  pantoprazole (PROTONIX) 40 MG tablet Take 1 tablet (40 mg total) by mouth 2 (two) times daily. 12/06/21 03/07/2022 Yes Dahal, Marlowe Aschoff, MD  predniSONE (DELTASONE) 20 MG tablet Take 2 tablets (40 mg total) by mouth daily with breakfast. Patient taking differently: Take 40 mg by mouth See admin instructions. Start date 02/28/2022- Take 40 mg by mouth with breakfast once a day for 5 days 03/10/22  Yes Martyn Ehrich, NP  Psyllium (METAMUCIL SMOOTH TEXTURE PO) Take 1 packet by mouth in the morning.   Yes [provider]  sodium chloride HYPERTONIC 3 % nebulizer solution Take by nebulization as needed for other. Patient taking differently: Take 4 mLs by nebulization as needed (as directed). 03/10/22  Yes Martyn Ehrich, NP  sucralfate (CARAFATE) 1 g tablet Take 1  tablet (1 g total) by mouth 4 (four) times daily -  with meals and at bedtime. 12/06/21 03/08/2022 Yes Dahal, Marlowe Aschoff, MD  tamsulosin (FLOMAX) 0.4 MG CAPS capsule Take 1 capsule (0.4 mg total) by mouth daily after supper. 07/14/21  Yes Dettinger, [name], MD  dextromethorphan 15 MG/5ML syrup Take 10 mLs (30 mg total) by mouth 4 (four) times daily as needed for cough. Patient not taking: Reported on 03/22/2022 02/08/22   Patrecia Pour, MD  diclofenac Sodium (VOLTAREN) 1 % GEL Apply 2 g topically 4 (four) times daily. Patient not taking: Reported on 03/13/2022 07/14/21   Dettinger, [name], MD  FEROSUL 325 (65 Fe) MG tablet TAKE (1) TABLET DAILY WITH BREAKFAST. Patient not taking: Reported on 03/11/2022 02/10/21   Dettinger, [name], MD  magnesium oxide (MAG-OX) 400 (241.3 Mg) MG tablet Take 1 tablet (400 mg total) by mouth 2 (two) times daily. Patient not taking: Reported on 03/11/2022 06/13/20   Cristal Deer, MD  nitroGLYCERIN (NITROSTAT) 0.4 MG SL tablet Place 1 tablet (0.4 mg total) under the tongue every 5 (five) minutes as needed for chest pain. Patient not taking: Reported on 03/03/2022 07/14/21   Dettinger, [name], MD  psyllium (METAMUCIL) 28 % packet Take 1 packet by mouth daily. Patient not taking: Reported on 03/20/2022 06/07/21   Gwenlyn Perking, FNP  Respiratory Therapy Supplies (FLUTTER) DEVI Twice a day and prn as needed, may increase if feeling worse 05/01/18   Martyn Ehrich, NP  traMADol (ULTRAM) 50 MG tablet Take 1 tablet (50 mg total) by mouth 2 (two) times daily as needed. Patient not taking: Reported on 03/11/2022 09/01/21   Aundra Dubin, PA-C     Critical care time: n/a     Otilio Carpen Amaree Loisel, PA-C [facility] See Amion for pager If no response to pager , please call [phone] until 7pm After 7:00 pm call Elink  [phone]?[facility]

## 2022-03-15 NOTE — Evaluation (Addendum)
Clinical/Bedside Swallow Evaluation Patient Details  Name: [name] MRN: [id number] Date of Birth: 06-Mar-1932  Today's Date: 03/15/2022 Time: SLP Start Time (ACUTE ONLY): 1100 SLP Stop Time (ACUTE ONLY): 1131 SLP Time Calculation (min) (ACUTE ONLY): 31 min  Past Medical History:  Past Medical History:  Diagnosis Date   Abnormality of gait 06/07/2013   Anemia    Aneurysm ([place])    on assending aorta, currently watching it, Dr Cyndia Bent   Anxiety    Arthritis    Asthma    Atrial fibrillation ([place])    B12 deficiency    Barrett's esophagus    CAD (coronary artery disease) 03/2006   3.0 x 20 mm TAXUS Perseus DES to the LAD; 01/2007  L main 30%, oLAD 50%, pLAD stent ok, CFX 80%, OM 60%, pRCA 60%, mRCA 70%, oPDA 90%; med rx    Cancer ([place])    skin cancer    Cataract    bilateral removal of cateracts   COLONIC POLYPS 07/17/2008   Qualifier: Diagnosis of  By: Lovette Cliche, CNA, Christy     Complication of anesthesia     no issues,but pt prefers spinal due to Pulmonary problems   COPD (chronic obstructive pulmonary disease) ([place])    oxygen  on standby in home.   Depression    Diverticulosis    Emphysema    Enlarged prostate with urinary retention    Esophageal stenosis    Gastric ulcer    GERD (gastroesophageal reflux disease)    Hiatal hernia    Hypothyroidism    Neuropathy    Osteoporosis    Pituitary macroadenoma ([facility])    Restless legs syndrome (RLS)    Tubular adenoma of colon    UGIB (upper gastrointestinal bleed) 03/2013   EGD w/ large ulcer at GE junction   Past Surgical History:  Past Surgical History:  Procedure Laterality Date   ABDOMINAL HERNIA REPAIR   2008   BIOPSY  06/13/2020   Procedure: BIOPSY;  Surgeon: Harvel Quale, MD;  [facility];  Service: Gastroenterology;;  gastric   BIOPSY  12/04/2021   Procedure: BIOPSY;  Surgeon: Juanita Craver, MD;  [facility]: [facility];  Service: Gastroenterology;;   CARDIAC CATHETERIZATION  01/2007   L  main 30%, oLAD 50%,  pLAD stent ok, CFX 80%, OM 60%, pRCA 60%, mRCA 70%, oPDA 90%; med rx   cataract extraction both eyes     COLONOSCOPY     COLONOSCOPY WITH PROPOFOL N/A 06/13/2020   Procedure: COLONOSCOPY WITH PROPOFOL;  Surgeon: Harvel Quale, MD;  [facility];  Service: Gastroenterology;  Laterality: N/A;   COLONOSCOPY WITH PROPOFOL N/A 12/04/2021   Procedure: COLONOSCOPY WITH PROPOFOL;  Surgeon: Juanita Craver, MD;  [facility];  Service: Gastroenterology;  Laterality: N/A;   CORONARY ANGIOPLASTY     CORONARY STENT PLACEMENT  03/2006   3.0 x 20 mm TAXUS Perseus DES to the LAD   CYSTOSCOPY N/A 06/10/2015   Procedure: CYSTOSCOPY FULGRATION OF BLEEDING,  electovapor resection;  Surgeon: Irine Seal, MD;  [facility];  Service: Urology;  Laterality: N/A;   CYSTOSCOPY WITH INSERTION OF UROLIFT N/A 06/01/2015   Procedure: CYSTOSCOPY WITH INSERTION OF UROLIFT x4;  Surgeon: Irine Seal, MD;  [facility];  Service: Urology;  Laterality: N/A;   ESOPHAGOGASTRODUODENOSCOPY N/A 04/20/2013   Procedure: ESOPHAGOGASTRODUODENOSCOPY (EGD);  Surgeon: Inda Castle, MD;  [facility];  Service: Endoscopy;  Laterality: N/A;   ESOPHAGOGASTRODUODENOSCOPY N/A 03/25/2016   Procedure: ESOPHAGOGASTRODUODENOSCOPY (  EGD);  Surgeon: Irene Shipper, MD;  [facility];  Service: Endoscopy;  Laterality: N/A;   ESOPHAGOGASTRODUODENOSCOPY (EGD) WITH PROPOFOL N/A 10/13/2015   Procedure: ESOPHAGOGASTRODUODENOSCOPY (EGD) WITH PROPOFOL;  Surgeon: Jerene Bears, MD;  [facility];  Service: Gastroenterology;  Laterality: N/A;   ESOPHAGOGASTRODUODENOSCOPY (EGD) WITH PROPOFOL N/A 06/13/2020   Procedure: ESOPHAGOGASTRODUODENOSCOPY (EGD) WITH PROPOFOL;  Surgeon: Harvel Quale, MD;  [facility];  Service: Gastroenterology;  Laterality: N/A;   ESOPHAGOGASTRODUODENOSCOPY (EGD) WITH PROPOFOL N/A 10/02/2021   Procedure: ESOPHAGOGASTRODUODENOSCOPY (EGD)  WITH PROPOFOL;  Surgeon: Daryel November, MD;  [facility];  Service: Gastroenterology;  Laterality: N/A;   ESOPHAGOGASTRODUODENOSCOPY (EGD) WITH PROPOFOL N/A 12/03/2021   Procedure: ESOPHAGOGASTRODUODENOSCOPY (EGD) WITH PROPOFOL;  Surgeon: Ladene Artist, MD;  [facility];  Service: Gastroenterology;  Laterality: N/A;   FOOT SURGERY  1994 left, 2002 right foot   bilateral foot reconstruciton   FOOT SURGERY     reconstruction of both feet- no retained hardware.   HERNIA REPAIR     HIATAL HERNIA REPAIR  01-04-2008   HOT HEMOSTASIS N/A 10/02/2021   Procedure: HOT HEMOSTASIS (ARGON PLASMA COAGULATION/BICAP);  Surgeon: Daryel November, MD;  [facility];  Service: Gastroenterology;  Laterality: N/A;   JOINT REPLACEMENT     MELANOMA EXCISION  2019   POLYPECTOMY     POLYPECTOMY  06/13/2020   Procedure: POLYPECTOMY;  Surgeon: Harvel Quale, MD;  [facility];  Service: Gastroenterology;;  ascending,    POLYPECTOMY  12/04/2021   Procedure: POLYPECTOMY;  Surgeon: Juanita Craver, MD;  [facility];  Service: Gastroenterology;;   PROSTATE SURGERY     x 2   SAVORY DILATION N/A 03/25/2016   Procedure: SAVORY DILATION;  Surgeon: Irene Shipper, MD;  [facility];  Service: Endoscopy;  Laterality: N/A;   TOTAL HIP ARTHROPLASTY  07/21/2011   Procedure: TOTAL HIP ARTHROPLASTY ANTERIOR APPROACH;  Surgeon: Mauri Pole, MD;  [facility];  Service: Orthopedics;  Laterality: Left;   TOTAL HIP ARTHROPLASTY Right 09/07/2012   Procedure: RIGHT TOTAL HIP ARTHROPLASTY ANTERIOR APPROACH;  Surgeon: Mcarthur Rossetti, MD;  [facility];  Service: Orthopedics;  Laterality: Right;   TRANSURETHRAL RESECTION OF BLADDER NECK N/A 11/04/2015   Procedure: RESECTION OF BLADDER NECK;  Surgeon: Cleon Gustin, MD;  [facility];  Service: Urology;  Laterality: N/A;   TRANSURETHRAL RESECTION OF PROSTATE N/A 11/04/2015   Procedure:  TRANSURETHRAL RESECTION OF THE PROSTATE (TURP); REMOVAL OF UROLIFT IMPLANTS X THREE;  Surgeon: Cleon Gustin, MD;  [facility];  Service: Urology;  Laterality: N/A;   UPPER GASTROINTESTINAL ENDOSCOPY  12/2013   Dr Hilarie Fredrickson, gastritis   VIDEO BRONCHOSCOPY Bilateral 02/01/2017   Procedure: VIDEO BRONCHOSCOPY WITHOUT FLUORO;  Surgeon: Tanda Rockers, MD;  [facility];  Service: Cardiopulmonary;  Laterality: Bilateral;   HPI:  89yo male admitted 03/07/2022 with SOB, blood-tinged productive sputum. Recent hospitalization for RSV. PMH: anemia, aneurysm, anxiety, arthritis, asthma, AFib, Barrett's esophagus, CAD, skin cancer, Bilateral cataracts, COPD, depression, GERD, hiatal hernia, RLS, upper GI bleed. Esophagram 07/2017 - Tight distal esophageal stricture, likely at the esophagogastric junction in the setting of a tiny hiatal hernia status post fundoplication.  Prominent CP, No aspiration.    Assessment / Plan / Recommendation  Clinical Impression  Pt seen at bedside to assess swallow safety and function. Pt was reclined in bed coughing upon arrival of SLP. Pt presents with adequate natural dentition. And  functional oral motor strength and function. Pt reports swallowing difficulty can be described as foods getting "stuck". He has had an esophagram (2019), which revealed a stricture, and hiatal hernia. A prominent CP was noted. No aspiration seen.   Pt was observed with ice chips, thin liquid, puree, and solid textures. Timely oral prep without residue noted. No cough response elicited, even when challenged to take multiple consecutive swallows of water. Several minutes later, began coughing up blood tinged material. RN also reports no difficulty during breakfast this morning. Cough came after he finished eating. Additionally, frequent belching was noted during this evaluation. Pt's performance raises concern for esophageal deficits which may have worsened since 2019. Recommend a regular  barium swallow to re-evaluate esophageal swallow. SLP will follow up once results are available to continue education with pt, and to determine least restrictive diet.  MD and RN informed.  SLP Visit Diagnosis: Dysphagia, unspecified (R13.10)    Aspiration Risk  Moderate aspiration risk;Risk for inadequate nutrition/hydration    Diet Recommendation NPO (pending BaSw results)   Medication Administration: small pills whole with puree, large pills crush Supervision: Patient able to self feed;Staff to assist with self feeding;Full supervision/cueing for compensatory strategies Compensations: Minimize environmental distractions;Slow rate;Small sips/bites Postural Changes: Seated upright at 90 degrees;Remain upright for at least 30 minutes after po intake    Other  Recommendations Recommended Consults: Consider esophageal assessment Oral Care Recommendations: Oral care BID Other Recommendations: Have oral suction available    Recommendations for follow up therapy are one component of a multi-disciplinary discharge planning process, led by the attending physician.  Recommendations may be updated based on patient status, additional functional criteria and insurance authorization.  Follow up Recommendations Follow physician's recommendations for discharge plan and follow up therapies      Assistance Recommended at Discharge PRN  Functional Status Assessment Patient has had a recent decline in their functional status and demonstrates the ability to make significant improvements in function in a reasonable and predictable amount of time.   Frequency and Duration min 1 x/week  1 week;2 weeks       Prognosis Prognosis for Safe Diet Advancement: Fair Barriers to Reach Goals:  (esophageal dysphagia)      Swallow Study   General Date of Onset: 03/26/2022 HPI: 89yo male admitted 03/29/2022 with SOB, blood-tinged productive sputum. Recent hospitalization for RSV. PMH: anemia, aneurysm, anxiety,  arthritis, asthma, AFib, Barrett's esophagus, CAD, skin cancer, Bilateral cataracts, COPD, depression, GERD, hiatal hernia, RLS, upper GI bleed. Esophagram 07/2017 - Tight distal esophageal stricture, likely at the esophagogastric junction in the setting of a tiny hiatal hernia status post fundoplication.  Prominent CP, No aspiration. Type of Study: Bedside Swallow Evaluation Previous Swallow Assessment: none Diet Prior to this Study: NPO Temperature Spikes Noted: No Respiratory Status: Nasal cannula (HFNC @ 35/min, FiO2 85%) History of Recent Intubation: No Behavior/Cognition: Alert;Cooperative;Pleasant mood Oral Cavity Assessment: Within Functional Limits Oral Care Completed by SLP: No Oral Cavity - Dentition: Adequate natural dentition Vision: Functional for self-feeding Self-Feeding Abilities: Able to feed self Patient Positioning: Upright in bed Baseline Vocal Quality: Normal Volitional Cough: Strong Volitional Swallow: Able to elicit    Oral/Motor/Sensory Function Overall Oral Motor/Sensory Function: Within functional limits   Ice Chips Ice chips: Within functional limits Presentation: Spoon   Thin Liquid Thin Liquid: Within functional limits Presentation: Straw;Self Fed    Nectar Thick Nectar Thick Liquid: Not tested   Honey Thick Honey Thick Liquid: Not tested   Puree Puree: Within functional limits  Presentation: Spoon;Self Fed   Solid     Solid: Within functional limits Presentation: [place] [facility], [facility], [place] Speech Language Pathologist Office: [phone]  Shonna Chock 03/15/2022,11:43 AM

## 2022-03-16 ENCOUNTER — Inpatient Hospital Stay (HOSPITAL_COMMUNITY): Payer: Medicare Other

## 2022-03-16 DIAGNOSIS — I959 Hypotension, unspecified: Secondary | ICD-10-CM

## 2022-03-16 DIAGNOSIS — I2510 Atherosclerotic heart disease of native coronary artery without angina pectoris: Secondary | ICD-10-CM

## 2022-03-16 DIAGNOSIS — J9601 Acute respiratory failure with hypoxia: Secondary | ICD-10-CM | POA: Diagnosis not present

## 2022-03-16 DIAGNOSIS — J9621 Acute and chronic respiratory failure with hypoxia: Secondary | ICD-10-CM | POA: Diagnosis not present

## 2022-03-16 DIAGNOSIS — J471 Bronchiectasis with (acute) exacerbation: Secondary | ICD-10-CM

## 2022-03-16 DIAGNOSIS — I480 Paroxysmal atrial fibrillation: Secondary | ICD-10-CM

## 2022-03-16 DIAGNOSIS — I5033 Acute on chronic diastolic (congestive) heart failure: Secondary | ICD-10-CM | POA: Diagnosis present

## 2022-03-16 DIAGNOSIS — D638 Anemia in other chronic diseases classified elsewhere: Secondary | ICD-10-CM | POA: Diagnosis present

## 2022-03-16 DIAGNOSIS — J189 Pneumonia, unspecified organism: Secondary | ICD-10-CM | POA: Diagnosis not present

## 2022-03-16 LAB — COMPREHENSIVE METABOLIC PANEL
ALT: 33 U/L (ref 0–44)
AST: 30 U/L (ref 15–41)
Albumin: 2.5 g/dL — ABNORMAL LOW (ref 3.5–5.0)
Alkaline Phosphatase: 52 U/L (ref 38–126)
Anion gap: 7 (ref 5–15)
BUN: 22 mg/dL (ref 8–23)
CO2: 20 mmol/L — ABNORMAL LOW (ref 22–32)
Calcium: 7.7 mg/dL — ABNORMAL LOW (ref 8.9–10.3)
Chloride: 106 mmol/L (ref 98–111)
Creatinine, Ser: 0.99 mg/dL (ref 0.61–1.24)
GFR, Estimated: >60 mL/min (ref >60)
Glucose, Bld: 135 mg/dL — ABNORMAL HIGH (ref 70–99)
Potassium: 4 mmol/L (ref 3.5–5.1)
Sodium: 133 mmol/L — ABNORMAL LOW (ref 135–145)
Total Bilirubin: 0.7 mg/dL (ref 0.3–1.2)
Total Protein: 6.3 g/dL — ABNORMAL LOW (ref 6.5–8.1)

## 2022-03-16 LAB — CBC WITH DIFFERENTIAL/PLATELET
Abs Immature Granulocytes: 0.63 10*3/uL — ABNORMAL HIGH (ref 0.00–0.07)
Basophils Absolute: 0 10*3/uL (ref 0.0–0.1)
Basophils Relative: 0 %
Eosinophils Absolute: 0 10*3/uL (ref 0.0–0.5)
Eosinophils Relative: 0 %
HCT: 32.9 % — ABNORMAL LOW (ref 39.0–52.0)
Hemoglobin: 10.3 g/dL — ABNORMAL LOW (ref 13.0–17.0)
Immature Granulocytes: 7 %
Lymphocytes Relative: 5 %
Lymphs Abs: 0.4 10*3/uL — ABNORMAL LOW (ref 0.7–4.0)
MCH: 28.1 pg (ref 26.0–34.0)
MCHC: 31.3 g/dL (ref 30.0–36.0)
MCV: 89.6 fL (ref 80.0–100.0)
Monocytes Absolute: 0.4 10*3/uL (ref 0.1–1.0)
Monocytes Relative: 4 %
Neutro Abs: 7.5 10*3/uL (ref 1.7–7.7)
Neutrophils Relative %: 84 %
Platelets: 326 10*3/uL (ref 150–400)
RBC: 3.67 MIL/uL — ABNORMAL LOW (ref 4.22–5.81)
RDW: 18.5 % — ABNORMAL HIGH (ref 11.5–15.5)
WBC: 9 10*3/uL (ref 4.0–10.5)
nRBC: 0 % (ref 0.0–0.2)

## 2022-03-16 LAB — CULTURE, RESPIRATORY W GRAM STAIN

## 2022-03-16 LAB — PHOSPHORUS: Phosphorus: 3.4 mg/dL (ref 2.5–4.6)

## 2022-03-16 LAB — MAGNESIUM: Magnesium: 2.2 mg/dL (ref 1.7–2.4)

## 2022-03-16 MED ORDER — METHYLPREDNISOLONE SODIUM SUCC 125 MG IJ SOLR
80.0000 mg | Freq: Every day | INTRAMUSCULAR | Status: DC
  Administered 2022-03-17: 80 mg via INTRAVENOUS
  Filled 2022-03-16: qty 2

## 2022-03-16 MED ORDER — ORAL CARE MOUTH RINSE
15.0000 mL | OROMUCOSAL | Status: DC
  Administered 2022-03-16 – 2022-03-17 (×6): 15 mL via OROMUCOSAL

## 2022-03-16 MED ORDER — SODIUM CHLORIDE 3 % IN NEBU
4.0000 mL | INHALATION_SOLUTION | Freq: Two times a day (BID) | RESPIRATORY_TRACT | Status: AC
  Administered 2022-03-16 – 2022-03-18 (×6): 4 mL via RESPIRATORY_TRACT
  Filled 2022-03-16 (×6): qty 4

## 2022-03-16 MED ORDER — IPRATROPIUM-ALBUTEROL 0.5-2.5 (3) MG/3ML IN SOLN
3.0000 mL | Freq: Four times a day (QID) | RESPIRATORY_TRACT | Status: DC
  Administered 2022-03-16 – 2022-03-19 (×10): 3 mL via RESPIRATORY_TRACT
  Filled 2022-03-16 (×11): qty 3

## 2022-03-16 MED ORDER — CALCIUM GLUCONATE-NACL 2-0.675 GM/100ML-% IV SOLN
2.0000 g | Freq: Once | INTRAVENOUS | Status: AC
  Administered 2022-03-16: 2000 mg via INTRAVENOUS
  Filled 2022-03-16: qty 100

## 2022-03-16 MED ORDER — ORAL CARE MOUTH RINSE: 15.0000 mL | OROMUCOSAL | Status: DC | PRN

## 2022-03-16 NOTE — Progress Notes (Signed)
Physical Therapy Treatment Patient Details Name: [name] MRN: [id number] DOB: 1932/03/19 Today's Date: 03/16/2022   History of Present Illness Pt is an 89yo male presenting to [facility] ED on 1/13 with SOB, hemoptysis. Found to have acute on chronic respiratory failure with hypoxia secondary to multifocal pneumonia  PMH: gait abnormality, anemia, AAA, afib, CAD s/p stent, COPD on 4L at baseline, GERD, hypothyroidism, neuropathy, b/l THA.    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  feeling "okay" asking for water but he is NPO Assisted OOB required increased time and + 2 assist for safety (multiple lines).  General transfer comment: from elvated bed + 2 asisst fro safety/multiple lines with mild c/o "light headed"otherwise did well. General Gait Details: transfers only this session due to c/o "light headed" and pt connected to HHF (attatched to wall) unit. Positioned in recliner to comfort.   Pt plans to return to ALF.  Recommendations for follow up therapy are one component of a multi-disciplinary discharge planning process, led by the attending physician.  Recommendations may be updated based on patient status, additional functional criteria and insurance authorization.  Follow Up Recommendations  Home health PT     Assistance Recommended at Discharge Set up Supervision/Assistance  Patient can return home with the following A little help with walking and/or transfers;A little help with bathing/dressing/bathroom;Assistance with cooking/housework;Assist for transportation;Help with stairs or ramp for entrance;Direct supervision/assist for medications management;Direct supervision/assist for financial management   Equipment Recommendations  None recommended by PT    Recommendations for Other Services       Precautions / Restrictions  Precautions Precautions: Fall Precaution Comments: monitor vitals, NPO aspiration PNA Restrictions Weight Bearing Restrictions: No     Mobility  Bed Mobility Overal bed mobility: Needs Assistance Bed Mobility: Supine to Sit     Supine to sit: Min assist     General bed mobility comments: increased time, HOB elevated and use of rails.  Did well.    Transfers Overall transfer level: Needs assistance Equipment used: Rolling walker (2 wheels) Transfers: Sit to/from Stand, Bed to chair/wheelchair/BSC Sit to Stand: Min guard, From elevated surface, +2 safety/equipment   Step pivot transfers: Min assist, +2 safety/equipment       General transfer comment: from elvated bed + 2 asisst fro safety/multiple lines with mild c/o "light headed"otherwise did well.    Ambulation/Gait         Gait velocity: decreased     General Gait Details: transfers only this session due to c/o "light headed" and pt connected to HHF (attatched to wall) unit.   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  feeling "okay" asking for water but he is NPO        Exercises      General Comments        Pertinent Vitals/Pain Pain Assessment Pain Assessment: No/denies pain    Home Living                          Prior Function            PT Goals (current goals can now be found in the care plan section) Progress towards PT goals: Progressing toward goals    Frequency    Min 3X/week      PT Plan Current plan remains appropriate    Co-evaluation               AM-PAC PT "6 Clicks" Mobility   Outcome Measure  Help needed turning from your back to your side while in a flat bed without using bedrails?: A Little Help needed moving from lying on your back to sitting on the side of a flat bed without using bedrails?: A Little Help needed moving to and from a bed to a chair (including a wheelchair)?: A Little Help needed standing up from a chair using your arms (e.g., wheelchair or bedside chair)?: A Lot Help needed to walk in hospital room?: A Lot Help needed climbing 3-5 steps with a railing? : A Lot 6 Click Score: 15    End of Session Equipment Utilized During Treatment: Gait belt;Oxygen Activity Tolerance: Patient limited by fatigue;Patient tolerated treatment well Patient left: in chair;with call bell/phone within reach Nurse Communication: Mobility status PT Visit Diagnosis: Difficulty in walking, not elsewhere classified (R26.2)     Time: 0102-7253 PT Time Calculation (min) (ACUTE ONLY): 28 min  Charges:  $Therapeutic Activity: 23-37 mins                     {Avarey Yaeger  PTA Acute  Rehabilitation Services Office M-F          [phone] Weekend pager [phone]

## 2022-03-16 NOTE — Progress Notes (Addendum)
PROGRESS NOTE    [name]  MWN:[id number] DOB: 26-Oct-1932 DOA: 03/02/2022 PCP: Dettinger, [name], MD   Brief Narrative:  [name] is a 89 y.o. WM PMHx Ascending aorta aneurysm, atrial fibrillation, CAD, GERD, COPD, depression,   Presents to ED for sob , blood tinged  productive sputum since few weeks. Patient is a poor historian and no family at bedside. He was recently admitted for RSV bronchitis, and discharged to ALF. He is chronically on  4 lit of [place] oxygen. H presents this admission for sob and an episode of hemoptysis and worsening pedal edema. He was admitted for acute on chronic respiratory failure with hypoxia sec to combination of pneumonia and CHF.  Hospital course complicated by aspiration and vomiting, requiring heated high flow oxygen. He was transferred to stepdown for closer monitoring. PCCM consulted for recommendations. Advanced directives in place with DNR/DNI. Waiting for clinical improvement.      Subjective: 1/17 afebrile overnight A/O x 4.  Chronically SOB on 4 L O2 at home.   Assessment & Plan:  Covid vaccination;  Principal Problem:   Acute on chronic respiratory failure with hypoxia (HCC) Active Problems:   Paroxysmal atrial fibrillation (HCC)   Bronchiectasis (HCC)   Multifocal pneumonia   Malnutrition of moderate degree   Acute on chronic diastolic CHF (congestive heart failure) (HCC)   Hypotension, unspecified   CAD in native artery   Anemia of chronic disease  Acute on chronic respiratory failure with hypoxia -Multifactorial see below.  Multifocal pneumonia -Complete course 7-day course of antibiotics - Flutter valve - Incentive spirometry - DuoNeb QID -Solu-Medrol 80 mg daily - Hypertonic saline nebs BID -Sputum pending - Viral cultures negative to date  Bronchiectasis -1/15 CXR Persistent interstitial and airspace opacities in the lungs bilaterally    Acute on Chronic Diastolic CHF  -BNP elevated, pedal edema present  on admission -Lasix IV 20 mg BID (hold) increased creatinine/soft BP.  -Strict in and out - Daily weight -Last echocardiogram 02/07/2022 preserved EF, diastolic dysfunction cannot be assessed    Hypotension:  -Resolved   Paroxysmal atrial fibrillation -Currently not rate controlled -Not on anticoagulation due to GI bleed  History of coronary artery disease -Troponins negative -Negative chest pain    Hypothyroidism -Synthroid 75 mcg daily     GERD Continue with PPI     Hypokalemia -Potassium goal> 4.   Hypocalcemia - Calcium goal> 8.9 - Calcium IV 2 g     Anemia of chronic disease -Hemoglobin 9-11   -Anemia panel showing low iron .  -Completion of antibiotics iron + vitamin C supplements .    Mobility Assessment (last 72 hours)     Mobility Assessment     Row Name 03/16/22 1321 03/14/22 1034         What is the highest level of mobility based on the progressive mobility assessment? Level 3 (Stands with assist) - Balance while standing  and cannot march in place Level 3 (Stands with assist) - Balance while standing  and cannot march in place               DVT prophylaxis: Lovenox Code Status: DNR Family Communication:  Status is: Inpatient    Dispo: The patient is from: Home              Anticipated d/c is to: SNF              Anticipated d/c date is: > 3 days  Patient currently is not medically stable to d/c.      Consultants:  PCCM  Procedures/Significant Events:     I have personally reviewed and interpreted all radiology studies and my findings are as above.  VENTILATOR SETTINGS: HFNC 1/17 Flow 40 L/min FiO2 55% SpO2 93%  Cultures COVID PCR, influenza PCR and RVP, strep pneumonia have been negative. Legionella antigen is negative  Antimicrobials: Anti-infectives (From admission, onward)    Start     Dose/Rate Route Frequency Ordered Stop   03/16/22 1000  azithromycin (ZITHROMAX) 500 mg in sodium chloride 0.9 %  250 mL IVPB        500 mg 250 mL/hr over 60 Minutes Intravenous Every 24 hours 03/15/22 1307 03/18/22 0959   03/15/22 1200  azithromycin (ZITHROMAX) 500 mg in sodium chloride 0.9 % 250 mL IVPB  Status:  Discontinued        500 mg 250 mL/hr over 60 Minutes Intravenous Every 24 hours 03/15/22 0944 03/15/22 1307   03/15/22 1000  azithromycin (ZITHROMAX) tablet 500 mg  Status:  Discontinued        500 mg Oral Daily 03/14/22 1142 03/15/22 0944   03/15/22 1000  metroNIDAZOLE (FLAGYL) IVPB 500 mg        500 mg 100 mL/hr over 60 Minutes Intravenous Every 12 hours 03/15/22 0906 03/20/22 0959   03/13/22 1130  azithromycin (ZITHROMAX) 500 mg in sodium chloride 0.9 % 250 mL IVPB  Status:  Discontinued        500 mg 250 mL/hr over 60 Minutes Intravenous Daily 03/13/22 1126 03/14/22 1142   03/27/2022 1830  vancomycin (VANCOCIN) IVPB 1000 mg/200 mL premix  Status:  Discontinued        1,000 mg 200 mL/hr over 60 Minutes Intravenous Every 24 hours 03/06/2022 1740 03/14/22 1147   03/06/2022 1800  ceFEPIme (MAXIPIME) 2 g in sodium chloride 0.9 % 100 mL IVPB        2 g 200 mL/hr over 30 Minutes Intravenous Every 12 hours 03/20/2022 1740           Devices    LINES / TUBES:      Continuous Infusions:  sodium chloride     azithromycin Stopped (03/16/22 1113)   ceFEPime (MAXIPIME) IV 2 g (03/16/22 1839)   metronidazole Stopped (03/16/22 1118)     Objective: Vitals:   03/16/22 1515 03/16/22 1600 03/16/22 1700 03/16/22 1800  BP:  122/69 122/71 117/80  Pulse:  (!) 108 (!) 166 (!) 131  Resp:  (!) 38 (!) 38 (!) 31  Temp:      TempSrc:      SpO2: 93% 91% 95% 94%  Weight:      Height:        Intake/Output Summary (Last 24 hours) at 03/16/2022 1939 Last data filed at 03/16/2022 1340 Gross per 24 hour  Intake 650.52 ml  Output --  Net 650.52 ml   Filed Weights   03/13/22 1500 03/14/22 0500  Weight: 70.3 kg 67 kg    Examination:  General: A/O x 4, positive acute on chronic respiratory  distress Eyes: negative scleral hemorrhage, negative anisocoria, negative icterus ENT: Negative Runny nose, negative gingival bleeding, Neck:  Negative scars, masses, torticollis, lymphadenopathy, JVD Lungs: positive crackles LLL, equal positive rhonchi RUL/RML  Cardiovascular: Tachycardic without murmur gallop or rub normal S1 and S2 Abdomen: negative abdominal pain, nondistended, positive soft, bowel sounds, no rebound, no ascites, no appreciable mass Extremities: No significant cyanosis, clubbing, or edema bilateral lower extremities Skin:  Negative rashes, lesions, ulcers Psychiatric:  Negative depression, negative anxiety, negative fatigue, negative mania  Central nervous system:  Cranial nerves II through XII intact, tongue/uvula midline, all extremities muscle strength 5/5, sensation intact throughout, negative dysarthria, negative expressive aphasia, negative receptive aphasia.  .     Data Reviewed: Care during the described time interval was provided by me .  I have reviewed this patient's available data, including medical history, events of note, physical examination, and all test results as part of my evaluation.   CBC: Recent Labs  Lab 03/10/22 0956 03/09/2022 1147 03/13/22 0512 03/14/22 0247 03/15/22 0249 03/16/22 0245  WBC 11.9* 12.3* 12.7* 12.1* 11.7* 9.0  NEUTROABS 9.3*  --  8.9* 8.2* 7.7 7.5  HGB 10.7* 9.9* 10.4* 11.6* 9.3* 10.3*  HCT 33.8* 32.3* 33.8* 37.3* 30.4* 32.9*  MCV 88.8 92.3 90.6 91.2 91.3 89.6  PLT 267.0 271 301 338 307 283   Basic Metabolic Panel: Recent Labs  Lab 03/23/2022 1147 03/13/22 0512 03/14/22 0247 03/15/22 0249 03/16/22 0245  NA 138 136 138 136 133*  K 3.8 3.2* 3.2* 3.4* 4.0  CL 109 104 102 106 106  CO2 20* '22 26 24 '$ 20*  GLUCOSE 95 90 95 93 135*  BUN 21 23 24* 22 22  CREATININE 1.16 1.19 1.47* 1.11 0.99  CALCIUM 7.7* 7.9* 8.1* 7.6* 7.7*  MG  --   --  2.3 2.2 2.2  PHOS  --   --  4.8* 3.0 3.4   GFR: Estimated Creatinine Clearance:  47.4 mL/min (by C-G formula based on SCr of 0.99 mg/dL). Liver Function Tests: Recent Labs  Lab 03/14/22 0247 03/15/22 0249 03/16/22 0245  AST 53* 30 30  ALT 45* 33 33  ALKPHOS 56 42 52  BILITOT 0.9 0.8 0.7  PROT 6.2* 5.2* 6.3*  ALBUMIN 2.6* 2.2* 2.5*   No results for input(s): "LIPASE", "AMYLASE" in the last 168 hours. No results for input(s): "AMMONIA" in the last 168 hours. Coagulation Profile: No results for input(s): "INR", "PROTIME" in the last 168 hours. Cardiac Enzymes: No results for input(s): "CKTOTAL", "CKMB", "CKMBINDEX", "TROPONINI" in the last 168 hours. BNP (last 3 results) Recent Labs    03/10/22 0956  PROBNP 679.0*   HbA1C: No results for input(s): "HGBA1C" in the last 72 hours. CBG: No results for input(s): "GLUCAP" in the last 168 hours. Lipid Profile: No results for input(s): "CHOL", "HDL", "LDLCALC", "TRIG", "CHOLHDL", "LDLDIRECT" in the last 72 hours. Thyroid Function Tests: No results for input(s): "TSH", "T4TOTAL", "FREET4", "T3FREE", "THYROIDAB" in the last 72 hours. Anemia Panel: Recent Labs    03/14/22 0247  VITAMINB12 800  FOLATE 13.7  FERRITIN 136  TIBC 202*  IRON 34*   Urine analysis:    Component Value Date/Time   COLORURINE YELLOW 12/03/2021 2212   APPEARANCEUR CLEAR 12/03/2021 2212   APPEARANCEUR Clear 09/20/2017 1337   LABSPEC 1.014 12/03/2021 2212   PHURINE 6.0 12/03/2021 2212   GLUCOSEU NEGATIVE 12/03/2021 2212   HGBUR NEGATIVE 12/03/2021 2212   BILIRUBINUR NEGATIVE 12/03/2021 2212   BILIRUBINUR Negative 09/20/2017 1337   KETONESUR NEGATIVE 12/03/2021 2212   PROTEINUR NEGATIVE 12/03/2021 2212   UROBILINOGEN negative 12/31/2013 1022   UROBILINOGEN 0.2 04/18/2013 2026   NITRITE NEGATIVE 12/03/2021 2212   LEUKOCYTESUR NEGATIVE 12/03/2021 2212   Sepsis Labs: '@LABRCNTIP'$ (procalcitonin:4,lacticidven:4)  ) Recent Results (from the past 240 hour(s))  MRSA Next Gen by PCR, Nasal     Status: None   Collection Time: 03/04/2022   5:42 PM   Specimen:  Nasal Mucosa; Nasal Swab  Result Value Ref Range Status   MRSA by PCR Next Gen NOT DETECTED NOT DETECTED Final    Comment: (NOTE) The GeneXpert MRSA Assay (FDA approved for NASAL specimens only), is one component of a comprehensive MRSA colonization surveillance program. It is not intended to diagnose MRSA infection nor to guide or monitor treatment for MRSA infections. Test performance is not FDA approved in patients less than 29 years old. Performed at [facility], [place] [address] [place], [place] [address]   Resp panel by RT-PCR (RSV, Flu A&B, Covid) Anterior Nasal Swab     Status: None   Collection Time: 03/07/2022  8:06 PM   Specimen: Anterior Nasal Swab  Result Value Ref Range Status   SARS Coronavirus 2 by RT PCR NEGATIVE NEGATIVE Final    Comment: (NOTE) SARS-CoV-2 target nucleic acids are NOT DETECTED.  The SARS-CoV-2 RNA is generally detectable in upper respiratory specimens during the acute phase of infection. The lowest concentration of SARS-CoV-2 viral copies this assay can detect is 138 copies/mL. A negative result does not preclude SARS-Cov-2 infection and should not be used as the sole basis for treatment or other patient management decisions. A negative result may occur with  improper specimen collection/handling, submission of specimen other than nasopharyngeal swab, presence of viral mutation(s) within the areas targeted by this assay, and inadequate number of viral copies(<138 copies/mL). A negative result must be combined with clinical observations, patient history, and epidemiological information. The expected result is Negative.  Fact Sheet for Patients:  EntrepreneurPulse.com.au  Fact Sheet for Healthcare Providers:  IncredibleEmployment.be  This test is no t yet approved or cleared by the [place] FDA and  has been authorized for detection and/or diagnosis of SARS-CoV-2 by FDA under an  Emergency Use Authorization (EUA). This EUA will remain  in effect (meaning this test can be used) for the duration of the COVID-19 declaration under Section 564(b)(1) of the Act, 21 U.S.C.section 360bbb-3(b)(1), unless the authorization is terminated  or revoked sooner.       Influenza A by PCR NEGATIVE NEGATIVE Final   Influenza B by PCR NEGATIVE NEGATIVE Final    Comment: (NOTE) The Xpert Xpress SARS-CoV-2/FLU/RSV plus assay is intended as an aid in the diagnosis of influenza from Nasopharyngeal swab specimens and should not be used as a sole basis for treatment. Nasal washings and aspirates are unacceptable for Xpert Xpress SARS-CoV-2/FLU/RSV testing.  Fact Sheet for Patients: EntrepreneurPulse.com.au  Fact Sheet for Healthcare Providers: IncredibleEmployment.be  This test is not yet approved or cleared by the [place] FDA and has been authorized for detection and/or diagnosis of SARS-CoV-2 by FDA under an Emergency Use Authorization (EUA). This EUA will remain in effect (meaning this test can be used) for the duration of the COVID-19 declaration under Section 564(b)(1) of the Act, 21 U.S.C. section 360bbb-3(b)(1), unless the authorization is terminated or revoked.     Resp Syncytial Virus by PCR NEGATIVE NEGATIVE Final    Comment: (NOTE) Fact Sheet for Patients: EntrepreneurPulse.com.au  Fact Sheet for Healthcare Providers: IncredibleEmployment.be  This test is not yet approved or cleared by the [place] FDA and has been authorized for detection and/or diagnosis of SARS-CoV-2 by FDA under an Emergency Use Authorization (EUA). This EUA will remain in effect (meaning this test can be used) for the duration of the COVID-19 declaration under Section 564(b)(1) of the Act, 21 U.S.C. section 360bbb-3(b)(1), unless the authorization is terminated or revoked.  Performed at [facility]  [place], [place] [address] [place], [place] [address]   Respiratory (~20 pathogens) panel by PCR     Status: None   Collection Time: 03/10/2022  8:06 PM   Specimen: Anterior Nasal Swab; Respiratory  Result Value Ref Range Status   Adenovirus NOT DETECTED NOT DETECTED Final   Coronavirus 229E NOT DETECTED NOT DETECTED Final    Comment: (NOTE) The Coronavirus on the Respiratory Panel, DOES NOT test for the novel  Coronavirus (2019 nCoV)    Coronavirus HKU1 NOT DETECTED NOT DETECTED Final   Coronavirus NL63 NOT DETECTED NOT DETECTED Final   Coronavirus OC43 NOT DETECTED NOT DETECTED Final   Metapneumovirus NOT DETECTED NOT DETECTED Final   Rhinovirus / Enterovirus NOT DETECTED NOT DETECTED Final   Influenza A NOT DETECTED NOT DETECTED Final   Influenza B NOT DETECTED NOT DETECTED Final   Parainfluenza Virus 1 NOT DETECTED NOT DETECTED Final   Parainfluenza Virus 2 NOT DETECTED NOT DETECTED Final   Parainfluenza Virus 3 NOT DETECTED NOT DETECTED Final   Parainfluenza Virus 4 NOT DETECTED NOT DETECTED Final   Respiratory Syncytial Virus NOT DETECTED NOT DETECTED Final   Bordetella pertussis NOT DETECTED NOT DETECTED Final   Bordetella Parapertussis NOT DETECTED NOT DETECTED Final   Chlamydophila pneumoniae NOT DETECTED NOT DETECTED Final   Mycoplasma pneumoniae NOT DETECTED NOT DETECTED Final    Comment: Performed at [facility], [place] [address], [place], [place] [address]  Expectorated Sputum Assessment w Gram Stain, Rflx to Resp Cult     Status: None   Collection Time: 03/05/2022 11:14 PM   Specimen: Sputum  Result Value Ref Range Status   Specimen Description SPUTUM  Final   Special Requests NONE  Final   Sputum evaluation   Final    Sputum specimen not acceptable for testing.  Please recollect.   Results Called to: Ashok Pall., RN AT 7062 ON 03/08/2022 BY LJM Performed at [facility], [place] [address], [place], [place] [address]    Report Status 03/26/2022 FINAL  Final   Expectorated Sputum Assessment w Gram Stain, Rflx to Resp Cult     Status: None   Collection Time: 03/13/22  5:25 AM  Result Value Ref Range Status   Specimen Description EXPECTORATED SPUTUM  Final   Special Requests NONE  Final   Sputum evaluation   Final    THIS SPECIMEN IS ACCEPTABLE FOR SPUTUM CULTURE Performed at [facility], [place] [address] [place], [place] [address]    Report Status 03/13/2022 FINAL  Final  Culture, Respiratory w Gram Stain     Status: None   Collection Time: 03/13/22  5:25 AM  Result Value Ref Range Status   Specimen Description   Final    EXPECTORATED SPUTUM Performed at [facility] [address] [place], [place] [address]    Special Requests   Final    NONE Reflexed from [id number] Performed at [place] [address], [place], [place] [address]    Gram Stain   Final    MODERATE WBC PRESENT, PREDOMINANTLY PMN FEW GRAM NEGATIVE RODS RARE SQUAMOUS EPITHELIAL CELLS PRESENT Performed at [facility], [place] [address], [place], [place] [address]    Culture RARE CANDIDA ALBICANS  Final   Report Status 03/16/2022 FINAL  Final         Radiology Studies: DG ESOPHAGUS W SINGLE CM (SOL OR THIN BA)  Result Date: 03/16/2022 CLINICAL DATA:  History of esophageal stricture, hiatal hernia and esophagitis. Patient complains of solid-food and medication dysphagia. Patient referred for fluoroscopic single contrast esophagram study. EXAM: ESOPHAGUS/BARIUM SWALLOW/TABLET STUDY TECHNIQUE: Single contrast examination was performed using thin liquid barium. This exam was performed by Narda Rutherford, NP, and was supervised and interpreted by Richardean Sale, MD. FLUOROSCOPY: Radiation Exposure Index (as provided by the fluoroscopic device): 24.30 mGy Kerma COMPARISON:  Chest CTA 03/01/2022, chest radiographs 03/14/2022 and esophagram 08/10/2017 FINDINGS: Limited exam due to patient's inability to stand or  position on the fluoroscopy table. No barium tablet administered. Swallowing: Appears normal. No vestibular penetration or aspiration seen. Pharynx: Prominent cricopharyngeal bar noted on swallowing, similar to previous study. Esophagus: Esophagus is patulous in appearance with a distal esophageal stricture, similar to the previous examination. There is a persistent, well-circumscribed filling defect within the distal esophagus which is suspicious for a pedunculated polyp. This appears non ulcerated. Esophageal motility: Stable mild esophageal dysmotility with mild tertiary contractions. Hiatal Hernia: Small reducible hiatal hernia. Previous Nissen fundoplication. Gastroesophageal reflux: None visualized with provocation maneuvers. Ingested 74m barium tablet: 13 mm barium tablet not given due to patient's inability to swallow pills. Other: None. IMPRESSION: 1. Persistent/recurrent distal esophageal stricture, incompletely evaluated in this patient unable to swallow pills. 2. Smoothly marginated filling defect at the gastroesophageal junction, suspicious for a pedunculated polyp. 3. Unchanged prominent cricopharyngeus impression on the posterior wall of the esophagus and mild esophageal dysmotility. Electronically Signed   By: WRichardean SaleM.D.   On: 03/16/2022 10:38   DG Abd 1 View  Result Date: 03/15/2022 CLINICAL DATA:  Vomiting EXAM: ABDOMEN - 1 VIEW COMPARISON:  CT scan 09/30/2021 FINDINGS: Substantial airspace opacity in the left lower lobe in both mid lungs suggesting multilobar pneumonia or aspiration pneumonitis. Blunted left costophrenic angle favoring left pleural effusion. Mild levoconvex lumbar scoliosis. Bilateral total hip prostheses are partially observed. Unremarkable bowel gas pattern. Formed stool in the colon. No dilated bowel. IMPRESSION: 1. Substantial airspace opacity in the left lower lobe and both mid lungs suggesting multilobar pneumonia or aspiration pneumonitis. 2. Blunted left  costophrenic angle favoring a small left pleural effusion. 3. Normal bowel gas pattern. Electronically Signed   By: WVan ClinesM.D.   On: 03/15/2022 12:26   DG CHEST PORT 1 VIEW  Result Date: 03/14/2022 CLINICAL DATA:  Dyspnea. EXAM: PORTABLE CHEST 1 VIEW COMPARISON:  03/14/2022. FINDINGS: The heart is enlarged and the mediastinal contour is stable. There is atherosclerotic calcification of the aorta. There are persistent interstitial and airspace opacities in the lungs bilaterally, not significantly changed from the prior exam. There are small bilateral pleural effusions. No pneumothorax. No acute osseous abnormality. IMPRESSION: 1. Persistent interstitial and airspace opacities in the lungs bilaterally, not significantly changed from the prior exam. 2. Small bilateral pleural effusions. Electronically Signed   By: LBrett FairyM.D.   On: 03/14/2022 22:58        Scheduled Meds:  Chlorhexidine Gluconate Cloth  6 each Topical Daily   enoxaparin (LOVENOX) injection  40 mg Subcutaneous Q24H   feeding supplement  237 mL Oral Q24H   hydrocortisone cream   Topical TID   ipratropium-albuterol  3 mL Nebulization QID   lactose free nutrition  237 mL Oral Q24H   levothyroxine  75 mcg Oral Q0600   [START ON 03/17/2022] methylPREDNISolone (SOLU-MEDROL) injection  80 mg Intravenous Daily   midodrine  5 mg Oral TID WC   mouth rinse  15 mL Mouth Rinse 4 times per day  pantoprazole (PROTONIX) IV  40 mg Intravenous Q24H   sodium chloride HYPERTONIC  4 mL Nebulization BID   sucralfate  1 g Oral TID WC & HS   Continuous Infusions:  sodium chloride     azithromycin Stopped (03/16/22 1113)   ceFEPime (MAXIPIME) IV 2 g (03/16/22 1839)   metronidazole Stopped (03/16/22 1118)     LOS: 4 days   The patient is critically ill with multiple organ systems failure and requires high complexity decision making for assessment and support, frequent evaluation and titration of therapies, application of  advanced monitoring technologies and extensive interpretation of multiple databases. Critical Care Time devoted to patient care services described in this note  Time spent: 40 minutes     Alaa Mullally, Geraldo Docker, MD [facility]   If 7PM-7AM, please contact night-coverage 03/16/2022, 7:39 PM

## 2022-03-16 NOTE — Progress Notes (Signed)
Speech Language Pathology Treatment: Dysphagia  Patient Details Name: [name] MRN: [id number] DOB: Oct 08, 1932 Today's Date: 03/16/2022 Time: 1344-1410 SLP Time Calculation (min) (ACUTE ONLY): 26 min  Assessment / Plan / Recommendation Clinical Impression  Pt seen at bedside for follow up after Esophageal study completed earlier today. No penetration or aspiration was seen on esophagram. SLP provided written behavioral and dietary strategies for management of esophageal dysmotility. This information was reviewed with pt and daughter.  Recommend soft foods (Dys2- Dys3) and thin liquids. Crushed meds, due to stricture. SLP answered questions to their satisfaction. ST signing off at this time. Please reconsult if needs arise.    HPI HPI: 89yo male admitted 03/13/2022 with SOB, blood-tinged productive sputum. Recent hospitalization for RSV. PMH: anemia, aneurysm, anxiety, arthritis, asthma, AFib, Barrett's esophagus, CAD, skin cancer, Bilateral cataracts, COPD, depression, GERD, hiatal hernia, RLS, upper GI bleed. Esophagram 07/2017 - Tight distal esophageal stricture, likely at the esophagogastric junction in the setting of a tiny hiatal hernia status post fundoplication.  Prominent CP, No aspiration.      SLP Plan  Discharge SLP treatment due to goals met      Recommendations for follow up therapy are one component of a multi-disciplinary discharge planning process, led by the attending physician.  Recommendations may be updated based on patient status, additional functional criteria and insurance authorization.    Recommendations  Diet recommendations: Dysphagia 3 (mechanical soft);Thin liquid Liquids provided via: Cup;Straw Medication Administration: Crushed with puree Supervision: Patient able to self feed;Intermittent supervision to cue for compensatory strategies Compensations: Minimize environmental distractions;Slow rate;Small sips/bites Postural Changes and/or Swallow Maneuvers:  Seated upright 90 degrees;Upright 30-60 min after meal                Oral Care Recommendations: Oral care BID Follow Up Recommendations: Follow physician's recommendations for discharge plan and follow up therapies Assistance recommended at discharge: Intermittent Supervision/Assistance SLP Visit Diagnosis: Dysphagia, unspecified (R13.10) Plan: Discharge SLP treatment due to goals met          [name] B. [name], [facility], [place] Speech Language Pathologist Office: [phone]  [name] 03/16/2022, 2:12 PM

## 2022-03-16 NOTE — Progress Notes (Signed)
NAME:  [name], MRN:  [id number], DOB:  Jul 05, 1932, LOS: 4 ADMISSION DATE:  03/02/2022, CONSULTATION DATE:  03/16/22 REFERRING MD:  Karleen Hampshire, CHIEF COMPLAINT:  Hypotension   History of Present Illness:  89 y/o M with PMH significant for bronchiectasis-never smoker and followed by Dr. Loanne Drilling on 2-5L home O2, AAA, atrial fibrillation, CAD, Barrett's esophagus, pituitary macroadenoma with recent RSV bronchitis admission and discharged to SNF on 02/10/22 who presented to the ED on 1/13 with several weeks of blood tinged sputum.    He was seen in the pulmonary office four days prior to admission with similar symptoms and prescribed Levaquin and Prednisone.   CXR with diffuse bilateral ground glass opacities and LUL infiltrate consistent with multifocal PNA.  He was admitted and treated with vancomycin and cefepime. COVID, flu and RSV and RVP all negative.  His BNP was elevated so was started on Lasix '20mg'$  bid and received metoprolol this morning.  Later in the day his blood pressure down-trended and PCCM consulted.  MAP 55-60, states that his head feels "fuzzy" and he is tired, otherwise is feeling improved.     Pertinent  Medical History   has a past medical history of Abnormality of gait (06/07/2013), Anemia, Aneurysm ([place]), Anxiety, Arthritis, Asthma, Atrial fibrillation ([place]), B12 deficiency, Barrett's esophagus, CAD (coronary artery disease) (03/2006), Cancer ([place]), Cataract, COLONIC POLYPS (83/66/2947), Complication of anesthesia, COPD (chronic obstructive pulmonary disease) ([place]), Depression, Diverticulosis, Emphysema, Enlarged prostate with urinary retention, Esophageal stenosis, Gastric ulcer, GERD (gastroesophageal reflux disease), Hiatal hernia, Hypothyroidism, Neuropathy, Osteoporosis, Pituitary macroadenoma (HCC), Restless legs syndrome (RLS), Tubular adenoma of colon, and UGIB (upper gastrointestinal bleed) (03/2013).   Significant Hospital Events: Including procedures, antibiotic start  and stop dates in addition to other pertinent events   1/13 admit, started on vanc/cefepime 1/15 PCCM consult, added azithryomycin 1/16 coughing/possible aspiration event overnight, on HFNC and peripheral levophed, O2 increased from 10L to 35L HFNC, coughing after eating, on 17mg levophed   Interim History / Subjective:  Afebrile  On heated high flow O2 > 40L / 50% O2 Pt reports ongoing cough but is at baseline, feels sputum production has neared his normal and blood in sputum reduced   Objective   Blood pressure (!) 134/112, pulse (!) 106, temperature 98.1 F (36.7 C), temperature source Oral, resp. rate (!) 22, height '5\' 6"'$  (1.676 m), weight 67 kg, SpO2 99 %.    FiO2 (%):  [55 %] 55 %   Intake/Output Summary (Last 24 hours) at 03/16/2022 06546Last data filed at 03/16/2022 0803 Gross per 24 hour  Intake 471.41 ml  Output --  Net 471.41 ml   Filed Weights   03/13/22 1500 03/14/22 0500  Weight: 70.3 kg 67 kg   Exam: General: elderly male sitting up in bed in NAD HEENT: MM pink/moist, anicteric, wearing glasses, HFNC Neuro: Awake/alert, pleasant, oriented, MAE  CV: s1s2 RRR, no m/r/g PULM: non-labored at rest, lungs bilaterally diminished with basilar crackles  GI: soft, bsx4 active  Extremities: warm/dry, no edema  Skin: no rashes or lesions  Resolved Hospital Problem list     Assessment & Plan:   Hypotension MAP 55-60, on review of previous admissions it appears his BP is usually 100/60's  Possibly from beta blocker and diuresis. Received IVF and albumin 1/15 -off levophed, monitor BP trend -continue methylprednisolone, consider transition to PO pending SLP -midodrine '5mg'$  TID, wean to off for MAP 65  Acute on Chronic Hypoxic Respiratory Failure likely secondary to bronchiectasis exacerbation and multi-focal  pneumonia Concern for aspiration Sputum culture pending, viral work-up has been negative. Coughing after meals and up to 35L HFNC today -pending SLP evaluation   -continue cefepime, azithromycin, flagyl  -flutter valve, IS  -mobilize  -continue xopenex PRN -follow intermittent CXR -aspiration precautions  Best Practice (right click and "Reselect all SmartList Selections" daily)  Per primary DNR/DNI    Critical care time: n/a    Noe Gens, MSN, APRN, NP-C, AGACNP-BC [facility] 03/16/2022, 8:12 AM   Please see [email or web address] for pager details.   From 7A-7P if no response, please call [phone] After hours, please call ELink [phone]

## 2022-03-16 NOTE — TOC Progression Note (Signed)
Transition of Care [facility]) - Progression Note    Patient Details  Name: [name] MRN: [id number] Date of Birth: Oct 20, 1932  Transition of Care [facility]) CM/SW [place], RN Phone Number: 03/16/2022, 5:13 PM  Clinical Narrative:   Claiborne Billings with [facility] will follow patient for HHPT, HHRN ( CHF protocol) services, updated AVS.    TOC following for discharge needs.    Expected Discharge Plan: [place] Barriers to Discharge: Continued Medical Work up  Expected Discharge Plan and Services In-house Referral: NA Discharge Planning Services: CM Consult Post Acute Care Choice: [place] arrangements for the past 2 months: Apartment                 DME Arranged: Oxygen DME Agency: [employer]       HH Arranged: RN, PT [place] Agency: [place] Date [place]: 03/14/22 Time [place]: [id number] Representative spoke with at [place]: [place] Determinants of Health ([place]) Interventions [place]: No Food Insecurity (12/02/2021)  Housing: Low Risk  (12/02/2021)  Transportation Needs: No Transportation Needs (12/02/2021)  Utilities: Not At Risk (12/02/2021)  Alcohol Screen: Low Risk  (02/08/2021)  Depression (PHQ2-9): Medium Risk (01/28/2022)  Financial Resource Strain: Low Risk  (02/08/2021)  Physical Activity: Insufficiently Active (02/08/2021)  Social Connections: Moderately Integrated (02/08/2021)  Stress: No Stress Concern Present (02/08/2021)  Tobacco Use: Low Risk  (03/13/2022)    Readmission Risk Interventions    10/05/2021   10:34 AM  Readmission Risk Prevention Plan  Post Dischage Appt Complete  Medication Screening Complete  Transportation Screening Complete

## 2022-03-17 ENCOUNTER — Inpatient Hospital Stay (HOSPITAL_COMMUNITY): Payer: Medicare Other

## 2022-03-17 DIAGNOSIS — J471 Bronchiectasis with (acute) exacerbation: Secondary | ICD-10-CM

## 2022-03-17 DIAGNOSIS — I2510 Atherosclerotic heart disease of native coronary artery without angina pectoris: Secondary | ICD-10-CM | POA: Diagnosis not present

## 2022-03-17 DIAGNOSIS — I959 Hypotension, unspecified: Secondary | ICD-10-CM | POA: Diagnosis not present

## 2022-03-17 DIAGNOSIS — J181 Lobar pneumonia, unspecified organism: Secondary | ICD-10-CM

## 2022-03-17 DIAGNOSIS — J9621 Acute and chronic respiratory failure with hypoxia: Secondary | ICD-10-CM | POA: Diagnosis not present

## 2022-03-17 DIAGNOSIS — I480 Paroxysmal atrial fibrillation: Secondary | ICD-10-CM | POA: Diagnosis not present

## 2022-03-17 DIAGNOSIS — I4891 Unspecified atrial fibrillation: Secondary | ICD-10-CM

## 2022-03-17 LAB — CBC WITH DIFFERENTIAL/PLATELET
Abs Immature Granulocytes: 0.91 10*3/uL — ABNORMAL HIGH (ref 0.00–0.07)
Basophils Absolute: 0.1 10*3/uL (ref 0.0–0.1)
Basophils Relative: 1 %
Eosinophils Absolute: 0 10*3/uL (ref 0.0–0.5)
Eosinophils Relative: 0 %
HCT: 33.5 % — ABNORMAL LOW (ref 39.0–52.0)
Hemoglobin: 10.5 g/dL — ABNORMAL LOW (ref 13.0–17.0)
Immature Granulocytes: 5 %
Lymphocytes Relative: 4 %
Lymphs Abs: 0.7 10*3/uL (ref 0.7–4.0)
MCH: 28.4 pg (ref 26.0–34.0)
MCHC: 31.3 g/dL (ref 30.0–36.0)
MCV: 90.5 fL (ref 80.0–100.0)
Monocytes Absolute: 1.3 10*3/uL — ABNORMAL HIGH (ref 0.1–1.0)
Monocytes Relative: 7 %
Neutro Abs: 14.8 10*3/uL — ABNORMAL HIGH (ref 1.7–7.7)
Neutrophils Relative %: 83 %
Platelets: 349 10*3/uL (ref 150–400)
RBC: 3.7 MIL/uL — ABNORMAL LOW (ref 4.22–5.81)
RDW: 18.6 % — ABNORMAL HIGH (ref 11.5–15.5)
WBC: 17.8 10*3/uL — ABNORMAL HIGH (ref 4.0–10.5)
nRBC: 0 % (ref 0.0–0.2)

## 2022-03-17 LAB — COMPREHENSIVE METABOLIC PANEL
ALT: 31 U/L (ref 0–44)
AST: 24 U/L (ref 15–41)
Albumin: 2.6 g/dL — ABNORMAL LOW (ref 3.5–5.0)
Alkaline Phosphatase: 50 U/L (ref 38–126)
Anion gap: 9 (ref 5–15)
BUN: 26 mg/dL — ABNORMAL HIGH (ref 8–23)
CO2: 20 mmol/L — ABNORMAL LOW (ref 22–32)
Calcium: 8.4 mg/dL — ABNORMAL LOW (ref 8.9–10.3)
Chloride: 108 mmol/L (ref 98–111)
Creatinine, Ser: 1.06 mg/dL (ref 0.61–1.24)
GFR, Estimated: >60 mL/min (ref >60)
Glucose, Bld: 122 mg/dL — ABNORMAL HIGH (ref 70–99)
Potassium: 4 mmol/L (ref 3.5–5.1)
Sodium: 137 mmol/L (ref 135–145)
Total Bilirubin: 0.7 mg/dL (ref 0.3–1.2)
Total Protein: 6.2 g/dL — ABNORMAL LOW (ref 6.5–8.1)

## 2022-03-17 LAB — MAGNESIUM: Magnesium: 2.5 mg/dL — ABNORMAL HIGH (ref 1.7–2.4)

## 2022-03-17 LAB — PHOSPHORUS: Phosphorus: 3.1 mg/dL (ref 2.5–4.6)

## 2022-03-17 MED ORDER — ORAL CARE MOUTH RINSE: 15.0000 mL | OROMUCOSAL | Status: DC | PRN

## 2022-03-17 MED ORDER — MIDODRINE HCL 5 MG PO TABS
2.5000 mg | ORAL_TABLET | Freq: Two times a day (BID) | ORAL | Status: DC
  Administered 2022-03-17 – 2022-03-19 (×4): 2.5 mg via ORAL
  Filled 2022-03-17 (×4): qty 1

## 2022-03-17 MED ORDER — AMIODARONE HCL IN DEXTROSE 360-4.14 MG/200ML-% IV SOLN
60.0000 mg/h | INTRAVENOUS | Status: AC
  Administered 2022-03-17 (×2): 60 mg/h via INTRAVENOUS
  Filled 2022-03-17 (×2): qty 200

## 2022-03-17 MED ORDER — ORAL CARE MOUTH RINSE
15.0000 mL | OROMUCOSAL | Status: DC
  Administered 2022-03-17 – 2022-03-20 (×10): 15 mL via OROMUCOSAL

## 2022-03-17 MED ORDER — METOPROLOL TARTRATE 5 MG/5ML IV SOLN: 2.5000 mg | INTRAVENOUS | Status: DC | PRN

## 2022-03-17 MED ORDER — AMIODARONE LOAD VIA INFUSION
150.0000 mg | Freq: Once | INTRAVENOUS | Status: AC
  Administered 2022-03-17: 150 mg via INTRAVENOUS
  Filled 2022-03-17: qty 83.34

## 2022-03-17 MED ORDER — AMIODARONE HCL IN DEXTROSE 360-4.14 MG/200ML-% IV SOLN
30.0000 mg/h | INTRAVENOUS | Status: DC
  Administered 2022-03-18 – 2022-03-19 (×3): 30 mg/h via INTRAVENOUS
  Filled 2022-03-17 (×3): qty 200

## 2022-03-17 MED ORDER — METOPROLOL SUCCINATE ER 25 MG PO TB24
50.0000 mg | ORAL_TABLET | Freq: Every day | ORAL | Status: DC
  Filled 2022-03-17: qty 2

## 2022-03-17 NOTE — Progress Notes (Addendum)
NAME:  [name], MRN:  [id number], DOB:  17-Apr-1932, LOS: 5 ADMISSION DATE:  03/18/2022, CONSULTATION DATE:  03/17/22 REFERRING MD:  Karleen Hampshire, CHIEF COMPLAINT:  Hypotension   History of Present Illness:  89 y/o M with PMH significant for bronchiectasis-never smoker and followed by Dr. Loanne Drilling on 2-5L home O2, AAA, atrial fibrillation, CAD, Barrett's esophagus, pituitary macroadenoma with recent RSV bronchitis admission and discharged to SNF on 02/10/22 who presented to the ED on 1/13 with several weeks of blood tinged sputum.    He was seen in the pulmonary office four days prior to admission with similar symptoms and prescribed Levaquin and Prednisone.   CXR with diffuse bilateral ground glass opacities and LUL infiltrate consistent with multifocal PNA.  He was admitted and treated with vancomycin and cefepime. COVID, flu and RSV and RVP all negative.  His BNP was elevated so was started on Lasix '20mg'$  bid and received metoprolol this morning.  Later in the day his blood pressure down-trended and PCCM consulted.  MAP 55-60, states that his head feels "fuzzy" and he is tired, otherwise is feeling improved.     Pertinent  Medical History   has a past medical history of Abnormality of gait (06/07/2013), Anemia, Aneurysm ([place]), Anxiety, Arthritis, Asthma, Atrial fibrillation ([place]), B12 deficiency, Barrett's esophagus, CAD (coronary artery disease) (03/2006), Cancer ([place]), Cataract, COLONIC POLYPS (69/48/5462), Complication of anesthesia, COPD (chronic obstructive pulmonary disease) ([place]), Depression, Diverticulosis, Emphysema, Enlarged prostate with urinary retention, Esophageal stenosis, Gastric ulcer, GERD (gastroesophageal reflux disease), Hiatal hernia, Hypothyroidism, Neuropathy, Osteoporosis, Pituitary macroadenoma (HCC), Restless legs syndrome (RLS), Tubular adenoma of colon, and UGIB (upper gastrointestinal bleed) (03/2013).   Significant Hospital Events: Including procedures, antibiotic start  and stop dates in addition to other pertinent events   1/13 admit, started on vanc/cefepime 1/15 PCCM consult, added azithryomycin 1/16 coughing/possible aspiration event overnight, on HFNC and peripheral levophed, O2 increased from 10L to 35L HFNC, coughing after eating, on 55mg levophed  1/18 - Off vasopressors  Interim History / Subjective:  Pt admits to feeling better today and is anxious to return home. He denies any pain and states he feels like his breathing has improved. Continues to cough and produce sputum, and not bloody.   Afebrile  Afib with Rates 140-160 On heated high flow O2 > 30L / 40% O2  Objective   Blood pressure (!) 145/127, pulse (!) 132, temperature 97.8 F (36.6 C), temperature source Oral, resp. rate (!) 24, height '5\' 6"'$  (1.676 m), weight 67 kg, SpO2 91 %.    FiO2 (%):  [40 %-50 %] 40 %   Intake/Output Summary (Last 24 hours) at 03/17/2022 0846 Last data filed at 03/17/2022 0719 Gross per 24 hour  Intake 642.84 ml  Output --  Net 642.84 ml   Filed Weights   03/13/22 1500 03/14/22 0500  Weight: 70.3 kg 67 kg   Physical Examination: General: Chronically ill-appearing elderly male in NAD, resting in bed eating breakfast. HEENT: /AT, anicteric sclera, PERRL, moist mucous membranes. Neuro: Awake, oriented x 4. Responds to verbal stimuli. Following commands consistently. Moves all 4 extremities spontaneously.  CV: Afib, no m/g/r. PULM: Breathing even and unlabored on 30L/40% HHFNC. Lung fields rhonchi/diminished. GI: Soft, nontender, nondistended. Normoactive bowel sounds. Extremities: no LE edema noted. Skin: Warm/dry, no rashes .   Resolved Hospital Problem list   Hypotension   Assessment & Plan:   Hypotension > Now Hypertensive  MAP 55-60, on review of previous admissions it appears his BP is usually 100/60's  Possibly from beta blocker and diuresis. Received IVF and albumin 1/15 - Off Vasopressors - Can transition to PO methylprednisolone> SLP  reccommended crushed meds  - Change Midodrine 2.5 BID   Acute on Chronic Hypoxic Respiratory Failure likely secondary to bronchiectasis exacerbation and multi-focal pneumonia Concern for aspiration > O2 requirements less today > 30L/40% Fio2  > Sputum Cx: few GNR's and candidia albicans  -SLP reccommended crushed meds and soft foods (Dys2- Dys3) and thin liquids.  -continue cefepime, azithromycin, flagyl  - Cont flutter valve and IS  - Mobilize Out of bed  -continue xopenex PRN -aspiration precautions - Follow intermittent CXR > 1/18: no significant changes   Afib Plan  - On metoprolol at home - deferring to primary team - EKG pending   Best Practice (right click and "Reselect all SmartList Selections" daily)  Per primary DNR/DNI    Critical care time:      Erma Heritage, NP-S Cct deferred to Attending

## 2022-03-17 NOTE — Progress Notes (Signed)
PROGRESS NOTE    [name]  WCH:[id number] DOB: 1932-08-20 DOA: 03/24/2022 PCP: Dettinger, [name], MD   Brief Narrative:  [name] is a 89 y.o. WM PMHx Ascending aorta aneurysm, atrial fibrillation, CAD, GERD, COPD, depression,   Presents to ED for sob , blood tinged  productive sputum since few weeks. Patient is a poor historian and no family at bedside. He was recently admitted for RSV bronchitis, and discharged to ALF. He is chronically on  4 lit of [employer] oxygen. H presents this admission for sob and an episode of hemoptysis and worsening pedal edema. He was admitted for acute on chronic respiratory failure with hypoxia sec to combination of pneumonia and CHF.  Hospital course complicated by aspiration and vomiting, requiring heated high flow oxygen. He was transferred to stepdown for closer monitoring. PCCM consulted for recommendations. Advanced directives in place with DNR/DNI. Waiting for clinical improvement.      Subjective: 1/18, afebrile overnight, overnight patient transitioned into A-fib with RVR.  Chronically SOB on 4 L O2 at home.  Patient unable to tolerate large pills therefore cannot swallow Toprol.    Assessment & Plan:  Covid vaccination;  Principal Problem:   Acute on chronic respiratory failure with hypoxia (HCC) Active Problems:   Paroxysmal atrial fibrillation (HCC)   Bronchiectasis (HCC)   Multifocal pneumonia   Malnutrition of moderate degree   Acute on chronic diastolic CHF (congestive heart failure) (HCC)   Hypotension, unspecified   CAD in native artery   Anemia of chronic disease  Acute on chronic respiratory failure with hypoxia -Multifactorial see below.  Multifocal pneumonia -Complete course 7-day course of antibiotics - Flutter valve - Incentive spirometry - DuoNeb QID -Solu-Medrol 80 mg daily - Hypertonic saline nebs BID -Sputum pending - Viral cultures negative to date  Bronchiectasis -1/15 CXR Persistent interstitial and  airspace opacities in the lungs bilaterally  Leukocytosis - 1/18 afebrile, negative left shift, negative bands.  Most likely secondary to steroids    Acute on Chronic Diastolic CHF  -BNP elevated, pedal edema present on admission -Lasix IV 20 mg BID (hold) increased creatinine/soft BP.  -Strict in and out +2.4 L - Daily weight Filed Weights   03/13/22 1500 03/14/22 0500  Weight: 70.3 kg 67 kg  -Last echocardiogram 02/07/2022 preserved EF, diastolic dysfunction cannot be assessed    Hypotension:  -Resolved   Paroxysmal atrial fibrillation with RVR -Currently not rate controlled -Not on anticoagulation due to GI bleed - 1/18 EKG see/W A-fib with RVR -1/18 Toprol 50 mg daily (home dose), ADDENDUM unable to tolerate large pills therefore cannot swallow Toprol. - 1/18 Amiodarone drip  History of coronary artery disease -Troponins negative -Negative chest pain    Hypothyroidism -Synthroid 75 mcg daily     GERD Continue with PPI     Hypokalemia -Potassium goal> 4.   Hypocalcemia - Calcium goal> 8.9 - Calcium IV 2 g - 1/18 resolved     Anemia of chronic disease -Hemoglobin 9-11   -Anemia panel showing low iron .  -Completion of antibiotics iron + vitamin C supplements .  Lab Results  Component Value Date   HGB 10.5 (L) 03/17/2022   HGB 10.3 (L) 03/16/2022   HGB 9.3 (L) 03/15/2022   HGB 11.6 (L) 03/14/2022   HGB 10.4 (L) 03/13/2022  -Stable   Mobility Assessment (last 72 hours)     Mobility Assessment     Row Name 03/16/22 1321 03/14/22 1034  What is the highest level of mobility based on the progressive mobility assessment? Level 3 (Stands with assist) - Balance while standing  and cannot march in place Level 3 (Stands with assist) - Balance while standing  and cannot march in place               DVT prophylaxis: Lovenox Code Status: DNR Family Communication:  Status is: Inpatient    Dispo: The patient is from: Home               Anticipated d/c is to: SNF              Anticipated d/c date is: > 3 days              Patient currently is not medically stable to d/c.      Consultants:  PCCM  Procedures/Significant Events:     I have personally reviewed and interpreted all radiology studies and my findings are as above.  VENTILATOR SETTINGS: HFNC 1/18 Flow 30 L/min FiO2 40% SpO2 91%    Cultures COVID PCR, influenza PCR and RVP, strep pneumonia have been negative. Legionella antigen is negative 1/13 influenza A/B negative 1/13 RSV negative 1/13 respiratory virus panel negative 1/13 SARS coronavirus negative 1/14 sputum positive few GNR   Antimicrobials: Anti-infectives (From admission, onward)    Start     Ordered Stop   03/16/22 1000  azithromycin (ZITHROMAX) 500 mg in sodium chloride 0.9 % 250 mL IVPB        03/15/22 1307 03/18/22 0959   03/15/22 1200  azithromycin (ZITHROMAX) 500 mg in sodium chloride 0.9 % 250 mL IVPB  Status:  Discontinued        03/15/22 0944 03/15/22 1307   03/15/22 1000  azithromycin (ZITHROMAX) tablet 500 mg  Status:  Discontinued        03/14/22 1142 03/15/22 0944   03/15/22 1000  metroNIDAZOLE (FLAGYL) IVPB 500 mg        03/15/22 0906 03/20/22 0959   03/13/22 1130  azithromycin (ZITHROMAX) 500 mg in sodium chloride 0.9 % 250 mL IVPB  Status:  Discontinued        03/13/22 1126 03/14/22 1142   03/05/2022 1830  vancomycin (VANCOCIN) IVPB 1000 mg/200 mL premix  Status:  Discontinued        03/20/2022 1740 03/14/22 1147   03/06/2022 1800  ceFEPIme (MAXIPIME) 2 g in sodium chloride 0.9 % 100 mL IVPB        03/24/2022 1740           Devices    LINES / TUBES:      Continuous Infusions:  sodium chloride     azithromycin Stopped (03/16/22 1113)   ceFEPime (MAXIPIME) IV Stopped (03/17/22 1761)   metronidazole Stopped (03/16/22 2315)     Objective: Vitals:   03/17/22 0400 03/17/22 0500 03/17/22 0600 03/17/22 0740  BP: (!) 142/95 (!) 141/90 (!) 145/127   Pulse:  (!) 124 (!) 45 (!) 132   Resp: (!) 30 (!) 6 (!) 24   Temp: 97.8 F (36.6 C)     TempSrc: Oral     SpO2: 91% 93% 90% 91%  Weight:      Height:        Intake/Output Summary (Last 24 hours) at 03/17/2022 0756 Last data filed at 03/17/2022 0719 Gross per 24 hour  Intake 742.84 ml  Output --  Net 742.84 ml    Filed Weights   03/13/22 1500 03/14/22 0500  Weight: 70.3  kg 67 kg   Physical Exam:  General: A/O x 4, positive acute on chronic  respiratory distress Eyes: negative scleral hemorrhage, negative anisocoria, negative icterus ENT: Negative Runny nose, negative gingival bleeding, Neck:  Negative scars, masses, torticollis, lymphadenopathy, JVD Lungs: positive crackles are RLL, mild diffuse expiratory wheeze,  Cardiovascular: Irregularly irregular rhythm and rate without murmur gallop or rub normal S1 and S2 Abdomen: negative abdominal pain, nondistended, positive soft, bowel sounds, no rebound, no ascites, no appreciable mass Extremities: No significant cyanosis, clubbing, or edema bilateral lower extremities Skin: Negative rashes, lesions, ulcers Psychiatric:  Negative depression, negative anxiety, negative fatigue, negative mania  Central nervous system:  Cranial nerves II through XII intact, tongue/uvula midline, all extremities muscle strength 5/5, sensation intact throughout, negative dysarthria, negative expressive aphasia, negative receptive aphasia.       Data Reviewed: Care during the described time interval was provided by me .  I have reviewed this patient's available data, including medical history, events of note, physical examination, and all test results as part of my evaluation.   CBC: Recent Labs  Lab 03/13/22 0512 03/14/22 0247 03/15/22 0249 03/16/22 0245 03/17/22 0232  WBC 12.7* 12.1* 11.7* 9.0 17.8*  NEUTROABS 8.9* 8.2* 7.7 7.5 14.8*  HGB 10.4* 11.6* 9.3* 10.3* 10.5*  HCT 33.8* 37.3* 30.4* 32.9* 33.5*  MCV 90.6 91.2 91.3 89.6 90.5  PLT 301 338 307  326 408    Basic Metabolic Panel: Recent Labs  Lab 03/13/22 0512 03/14/22 0247 03/15/22 0249 03/16/22 0245 03/17/22 0232  NA 136 138 136 133* 137  K 3.2* 3.2* 3.4* 4.0 4.0  CL 104 102 106 106 108  CO2 '22 26 24 '$ 20* 20*  GLUCOSE 90 95 93 135* 122*  BUN 23 24* 22 22 26*  CREATININE 1.19 1.47* 1.11 0.99 1.06  CALCIUM 7.9* 8.1* 7.6* 7.7* 8.4*  MG  --  2.3 2.2 2.2 2.5*  PHOS  --  4.8* 3.0 3.4 3.1    GFR: Estimated Creatinine Clearance: 44.3 mL/min (by C-G formula based on SCr of 1.06 mg/dL). Liver Function Tests: Recent Labs  Lab 03/14/22 0247 03/15/22 0249 03/16/22 0245 03/17/22 0232  AST 53* '30 30 24  '$ ALT 45* 33 33 31  ALKPHOS 56 42 52 50  BILITOT 0.9 0.8 0.7 0.7  PROT 6.2* 5.2* 6.3* 6.2*  ALBUMIN 2.6* 2.2* 2.5* 2.6*    No results for input(s): "LIPASE", "AMYLASE" in the last 168 hours. No results for input(s): "AMMONIA" in the last 168 hours. Coagulation Profile: No results for input(s): "INR", "PROTIME" in the last 168 hours. Cardiac Enzymes: No results for input(s): "CKTOTAL", "CKMB", "CKMBINDEX", "TROPONINI" in the last 168 hours. BNP (last 3 results) Recent Labs    03/10/22 0956  PROBNP 679.0*    HbA1C: No results for input(s): "HGBA1C" in the last 72 hours. CBG: No results for input(s): "GLUCAP" in the last 168 hours. Lipid Profile: No results for input(s): "CHOL", "HDL", "LDLCALC", "TRIG", "CHOLHDL", "LDLDIRECT" in the last 72 hours. Thyroid Function Tests: No results for input(s): "TSH", "T4TOTAL", "FREET4", "T3FREE", "THYROIDAB" in the last 72 hours. Anemia Panel: No results for input(s): "VITAMINB12", "FOLATE", "FERRITIN", "TIBC", "IRON", "RETICCTPCT" in the last 72 hours.  Urine analysis:    Component Value Date/Time   COLORURINE YELLOW 12/03/2021 2212   APPEARANCEUR CLEAR 12/03/2021 2212   APPEARANCEUR Clear 09/20/2017 1337   LABSPEC 1.014 12/03/2021 2212   PHURINE 6.0 12/03/2021 [place] 12/03/2021 2212   HGBUR NEGATIVE  12/03/2021 2212  BILIRUBINUR NEGATIVE 12/03/2021 2212   BILIRUBINUR Negative 09/20/2017 [place] 12/03/2021 2212   PROTEINUR NEGATIVE 12/03/2021 2212   UROBILINOGEN negative 12/31/2013 1022   UROBILINOGEN 0.2 04/18/2013 2026   NITRITE NEGATIVE 12/03/2021 2212   LEUKOCYTESUR NEGATIVE 12/03/2021 2212   Sepsis Labs: '@LABRCNTIP'$ (procalcitonin:4,lacticidven:4)  ) Recent Results (from the past 240 hour(s))  MRSA Next Gen by PCR, Nasal     Status: None   Collection Time: 03/20/2022  5:42 PM   Specimen: Nasal Mucosa; Nasal Swab  Result Value Ref Range Status   MRSA by PCR Next Gen NOT DETECTED NOT DETECTED Final    Comment: (NOTE) The GeneXpert MRSA Assay (FDA approved for NASAL specimens only), is one component of a comprehensive MRSA colonization surveillance program. It is not intended to diagnose MRSA infection nor to guide or monitor treatment for MRSA infections. Test performance is not FDA approved in patients less than 71 years old. Performed at [facility], [place] [address] [place], [place] [address]   Resp panel by RT-PCR (RSV, Flu A&B, Covid) Anterior Nasal Swab     Status: None   Collection Time: 03/17/2022  8:06 PM   Specimen: Anterior Nasal Swab  Result Value Ref Range Status   SARS Coronavirus 2 by RT PCR NEGATIVE NEGATIVE Final    Comment: (NOTE) SARS-CoV-2 target nucleic acids are NOT DETECTED.  The SARS-CoV-2 RNA is generally detectable in upper respiratory specimens during the acute phase of infection. The lowest concentration of SARS-CoV-2 viral copies this assay can detect is 138 copies/mL. A negative result does not preclude SARS-Cov-2 infection and should not be used as the sole basis for treatment or other patient management decisions. A negative result may occur with  improper specimen collection/handling, submission of specimen other than nasopharyngeal swab, presence of viral mutation(s) within the areas targeted by this assay, and  inadequate number of viral copies(<138 copies/mL). A negative result must be combined with clinical observations, patient history, and epidemiological information. The expected result is Negative.  Fact Sheet for Patients:  EntrepreneurPulse.com.au  Fact Sheet for Healthcare Providers:  IncredibleEmployment.be  This test is no t yet approved or cleared by the [place] FDA and  has been authorized for detection and/or diagnosis of SARS-CoV-2 by FDA under an Emergency Use Authorization (EUA). This EUA will remain  in effect (meaning this test can be used) for the duration of the COVID-19 declaration under Section 564(b)(1) of the Act, 21 U.S.C.section 360bbb-3(b)(1), unless the authorization is terminated  or revoked sooner.       Influenza A by PCR NEGATIVE NEGATIVE Final   Influenza B by PCR NEGATIVE NEGATIVE Final    Comment: (NOTE) The Xpert Xpress SARS-CoV-2/FLU/RSV plus assay is intended as an aid in the diagnosis of influenza from Nasopharyngeal swab specimens and should not be used as a sole basis for treatment. Nasal washings and aspirates are unacceptable for Xpert Xpress SARS-CoV-2/FLU/RSV testing.  Fact Sheet for Patients: EntrepreneurPulse.com.au  Fact Sheet for Healthcare Providers: IncredibleEmployment.be  This test is not yet approved or cleared by the [place] FDA and has been authorized for detection and/or diagnosis of SARS-CoV-2 by FDA under an Emergency Use Authorization (EUA). This EUA will remain in effect (meaning this test can be used) for the duration of the COVID-19 declaration under Section 564(b)(1) of the Act, 21 U.S.C. section 360bbb-3(b)(1), unless the authorization is terminated or revoked.     Resp Syncytial Virus by PCR NEGATIVE NEGATIVE Final    Comment: (NOTE)  Fact Sheet for Patients: EntrepreneurPulse.com.au  Fact Sheet for Healthcare  Providers: IncredibleEmployment.be  This test is not yet approved or cleared by the [place] FDA and has been authorized for detection and/or diagnosis of SARS-CoV-2 by FDA under an Emergency Use Authorization (EUA). This EUA will remain in effect (meaning this test can be used) for the duration of the COVID-19 declaration under Section 564(b)(1) of the Act, 21 U.S.C. section 360bbb-3(b)(1), unless the authorization is terminated or revoked.  Performed at [facility], [place] [address] [place], [place] [address]   Respiratory (~20 pathogens) panel by PCR     Status: None   Collection Time: 03/11/2022  8:06 PM   Specimen: Anterior Nasal Swab; Respiratory  Result Value Ref Range Status   Adenovirus NOT DETECTED NOT DETECTED Final   Coronavirus 229E NOT DETECTED NOT DETECTED Final    Comment: (NOTE) The Coronavirus on the Respiratory Panel, DOES NOT test for the novel  Coronavirus (2019 nCoV)    Coronavirus HKU1 NOT DETECTED NOT DETECTED Final   Coronavirus NL63 NOT DETECTED NOT DETECTED Final   Coronavirus OC43 NOT DETECTED NOT DETECTED Final   Metapneumovirus NOT DETECTED NOT DETECTED Final   Rhinovirus / Enterovirus NOT DETECTED NOT DETECTED Final   Influenza A NOT DETECTED NOT DETECTED Final   Influenza B NOT DETECTED NOT DETECTED Final   Parainfluenza Virus 1 NOT DETECTED NOT DETECTED Final   Parainfluenza Virus 2 NOT DETECTED NOT DETECTED Final   Parainfluenza Virus 3 NOT DETECTED NOT DETECTED Final   Parainfluenza Virus 4 NOT DETECTED NOT DETECTED Final   Respiratory Syncytial Virus NOT DETECTED NOT DETECTED Final   Bordetella pertussis NOT DETECTED NOT DETECTED Final   Bordetella Parapertussis NOT DETECTED NOT DETECTED Final   Chlamydophila pneumoniae NOT DETECTED NOT DETECTED Final   Mycoplasma pneumoniae NOT DETECTED NOT DETECTED Final    Comment: Performed at [facility], [place] [address] [place], [place] [address]  Expectorated Sputum  Assessment w Gram Stain, Rflx to Resp Cult     Status: None   Collection Time: 03/30/2022 11:14 PM   Specimen: Sputum  Result Value Ref Range Status   Specimen Description SPUTUM  Final   Special Requests NONE  Final   Sputum evaluation   Final    Sputum specimen not acceptable for testing.  Please recollect.   Results Called to: Ashok Pall., RN AT 2355 ON 03/13/2022 BY LJM Performed at [facility], [place] [address] [place], [place] [address]    Report Status 03/25/2022 FINAL  Final  Expectorated Sputum Assessment w Gram Stain, Rflx to Resp Cult     Status: None   Collection Time: 03/13/22  5:25 AM  Result Value Ref Range Status   Specimen Description EXPECTORATED SPUTUM  Final   Special Requests NONE  Final   Sputum evaluation   Final    THIS SPECIMEN IS ACCEPTABLE FOR SPUTUM CULTURE Performed at [facility], [place] [address], [place], [place] [address]    Report Status 03/13/2022 FINAL  Final  Culture, Respiratory w Gram Stain     Status: None   Collection Time: 03/13/22  5:25 AM  Result Value Ref Range Status   Specimen Description   Final    EXPECTORATED SPUTUM Performed at [place] [address], [place], [place] [address]    Special Requests   Final    NONE Reflexed from [id number] Performed at [facility] [address]  [place], [place] [address]    Gram Stain   Final    MODERATE WBC PRESENT, PREDOMINANTLY PMN FEW GRAM NEGATIVE RODS RARE SQUAMOUS EPITHELIAL CELLS PRESENT Performed at [facility], [place] [address] [place], [place] [address]    Culture RARE CANDIDA ALBICANS  Final   Report Status 03/16/2022 FINAL  Final         Radiology Studies: DG ESOPHAGUS W SINGLE CM (SOL OR THIN BA)  Result Date: 03/16/2022 CLINICAL DATA:  History of esophageal stricture, hiatal hernia and esophagitis. Patient complains of solid-food and medication dysphagia. Patient referred for fluoroscopic  single contrast esophagram study. EXAM: ESOPHAGUS/BARIUM SWALLOW/TABLET STUDY TECHNIQUE: Single contrast examination was performed using thin liquid barium. This exam was performed by Narda Rutherford, NP, and was supervised and interpreted by Richardean Sale, MD. FLUOROSCOPY: Radiation Exposure Index (as provided by the fluoroscopic device): 24.30 mGy Kerma COMPARISON:  Chest CTA 03/14/2022, chest radiographs 03/14/2022 and esophagram 08/10/2017 FINDINGS: Limited exam due to patient's inability to stand or position on the fluoroscopy table. No barium tablet administered. Swallowing: Appears normal. No vestibular penetration or aspiration seen. Pharynx: Prominent cricopharyngeal bar noted on swallowing, similar to previous study. Esophagus: Esophagus is patulous in appearance with a distal esophageal stricture, similar to the previous examination. There is a persistent, well-circumscribed filling defect within the distal esophagus which is suspicious for a pedunculated polyp. This appears non ulcerated. Esophageal motility: Stable mild esophageal dysmotility with mild tertiary contractions. Hiatal Hernia: Small reducible hiatal hernia. Previous Nissen fundoplication. Gastroesophageal reflux: None visualized with provocation maneuvers. Ingested 31m barium tablet: 13 mm barium tablet not given due to patient's inability to swallow pills. Other: None. IMPRESSION: 1. Persistent/recurrent distal esophageal stricture, incompletely evaluated in this patient unable to swallow pills. 2. Smoothly marginated filling defect at the gastroesophageal junction, suspicious for a pedunculated polyp. 3. Unchanged prominent cricopharyngeus impression on the posterior wall of the esophagus and mild esophageal dysmotility. Electronically Signed   By: WRichardean SaleM.D.   On: 03/16/2022 10:38   DG Abd 1 View  Result Date: 03/15/2022 CLINICAL DATA:  Vomiting EXAM: ABDOMEN - 1 VIEW COMPARISON:  CT scan 09/30/2021 FINDINGS: Substantial  airspace opacity in the left lower lobe in both mid lungs suggesting multilobar pneumonia or aspiration pneumonitis. Blunted left costophrenic angle favoring left pleural effusion. Mild levoconvex lumbar scoliosis. Bilateral total hip prostheses are partially observed. Unremarkable bowel gas pattern. Formed stool in the colon. No dilated bowel. IMPRESSION: 1. Substantial airspace opacity in the left lower lobe and both mid lungs suggesting multilobar pneumonia or aspiration pneumonitis. 2. Blunted left costophrenic angle favoring a small left pleural effusion. 3. Normal bowel gas pattern. Electronically Signed   By: WVan ClinesM.D.   On: 03/15/2022 12:26        Scheduled Meds:  Chlorhexidine Gluconate Cloth  6 each Topical Daily   enoxaparin (LOVENOX) injection  40 mg Subcutaneous Q24H   feeding supplement  237 mL Oral Q24H   hydrocortisone cream   Topical TID   ipratropium-albuterol  3 mL Nebulization QID   lactose free nutrition  237 mL Oral Q24H   levothyroxine  75 mcg Oral Q0600   methylPREDNISolone (SOLU-MEDROL) injection  80 mg Intravenous Daily   midodrine  5 mg Oral TID WC   mouth rinse  15 mL Mouth Rinse 4 times per day   pantoprazole (PROTONIX) IV  40 mg Intravenous Q24H   sodium chloride HYPERTONIC  4 mL Nebulization BID   sucralfate  1 g Oral TID  WC & HS   Continuous Infusions:  sodium chloride     azithromycin Stopped (03/16/22 1113)   ceFEPime (MAXIPIME) IV Stopped (03/17/22 6314)   metronidazole Stopped (03/16/22 2315)     LOS: 5 days   The patient is critically ill with multiple organ systems failure and requires high complexity decision making for assessment and support, frequent evaluation and titration of therapies, application of advanced monitoring technologies and extensive interpretation of multiple databases. Critical Care Time devoted to patient care services described in this note  Time spent: 40 minutes     Kymberlee Viger, Geraldo Docker, MD [facility]   If 7PM-7AM, please contact night-coverage 03/17/2022, 7:56 AM

## 2022-03-18 DIAGNOSIS — J181 Lobar pneumonia, unspecified organism: Secondary | ICD-10-CM | POA: Diagnosis not present

## 2022-03-18 DIAGNOSIS — E440 Moderate protein-calorie malnutrition: Secondary | ICD-10-CM

## 2022-03-18 DIAGNOSIS — I2510 Atherosclerotic heart disease of native coronary artery without angina pectoris: Secondary | ICD-10-CM | POA: Diagnosis not present

## 2022-03-18 DIAGNOSIS — J9621 Acute and chronic respiratory failure with hypoxia: Secondary | ICD-10-CM | POA: Diagnosis not present

## 2022-03-18 DIAGNOSIS — J471 Bronchiectasis with (acute) exacerbation: Secondary | ICD-10-CM | POA: Diagnosis not present

## 2022-03-18 LAB — COMPREHENSIVE METABOLIC PANEL
ALT: 27 U/L (ref 0–44)
AST: 24 U/L (ref 15–41)
Albumin: 2.4 g/dL — ABNORMAL LOW (ref 3.5–5.0)
Alkaline Phosphatase: 44 U/L (ref 38–126)
Anion gap: 4 — ABNORMAL LOW (ref 5–15)
BUN: 27 mg/dL — ABNORMAL HIGH (ref 8–23)
CO2: 21 mmol/L — ABNORMAL LOW (ref 22–32)
Calcium: 7.6 mg/dL — ABNORMAL LOW (ref 8.9–10.3)
Chloride: 110 mmol/L (ref 98–111)
Creatinine, Ser: 0.92 mg/dL (ref 0.61–1.24)
GFR, Estimated: >60 mL/min (ref >60)
Glucose, Bld: 116 mg/dL — ABNORMAL HIGH (ref 70–99)
Potassium: 3.6 mmol/L (ref 3.5–5.1)
Sodium: 135 mmol/L (ref 135–145)
Total Bilirubin: 0.4 mg/dL (ref 0.3–1.2)
Total Protein: 5.8 g/dL — ABNORMAL LOW (ref 6.5–8.1)

## 2022-03-18 LAB — CBC WITH DIFFERENTIAL/PLATELET
Abs Immature Granulocytes: 0.91 10*3/uL — ABNORMAL HIGH (ref 0.00–0.07)
Basophils Absolute: 0.1 10*3/uL (ref 0.0–0.1)
Basophils Relative: 1 %
Eosinophils Absolute: 0 10*3/uL (ref 0.0–0.5)
Eosinophils Relative: 0 %
HCT: 31.1 % — ABNORMAL LOW (ref 39.0–52.0)
Hemoglobin: 9.8 g/dL — ABNORMAL LOW (ref 13.0–17.0)
Immature Granulocytes: 5 %
Lymphocytes Relative: 4 %
Lymphs Abs: 0.6 10*3/uL — ABNORMAL LOW (ref 0.7–4.0)
MCH: 28.5 pg (ref 26.0–34.0)
MCHC: 31.5 g/dL (ref 30.0–36.0)
MCV: 90.4 fL (ref 80.0–100.0)
Monocytes Absolute: 1.3 10*3/uL — ABNORMAL HIGH (ref 0.1–1.0)
Monocytes Relative: 7 %
Neutro Abs: 14.3 10*3/uL — ABNORMAL HIGH (ref 1.7–7.7)
Neutrophils Relative %: 83 %
Platelets: 316 10*3/uL (ref 150–400)
RBC: 3.44 MIL/uL — ABNORMAL LOW (ref 4.22–5.81)
RDW: 18.7 % — ABNORMAL HIGH (ref 11.5–15.5)
WBC: 17.2 10*3/uL — ABNORMAL HIGH (ref 4.0–10.5)
nRBC: 0 % (ref 0.0–0.2)

## 2022-03-18 LAB — MAGNESIUM: Magnesium: 2.5 mg/dL — ABNORMAL HIGH (ref 1.7–2.4)

## 2022-03-18 LAB — PHOSPHORUS: Phosphorus: 3.1 mg/dL (ref 2.5–4.6)

## 2022-03-18 MED ORDER — POTASSIUM CHLORIDE CRYS ER 20 MEQ PO TBCR
40.0000 meq | EXTENDED_RELEASE_TABLET | Freq: Once | ORAL | Status: AC
  Administered 2022-03-18: 40 meq via ORAL
  Filled 2022-03-18: qty 2

## 2022-03-18 MED ORDER — ENSURE ENLIVE PO LIQD
237.0000 mL | Freq: Two times a day (BID) | ORAL | Status: DC
  Administered 2022-03-18 (×2): 237 mL via ORAL

## 2022-03-18 MED ORDER — CALCIUM GLUCONATE-NACL 2-0.675 GM/100ML-% IV SOLN
2.0000 g | Freq: Once | INTRAVENOUS | Status: AC
  Administered 2022-03-18: 2000 mg via INTRAVENOUS
  Filled 2022-03-18: qty 100

## 2022-03-18 MED ORDER — PREDNISONE 20 MG PO TABS
40.0000 mg | ORAL_TABLET | Freq: Every day | ORAL | Status: DC
  Administered 2022-03-18 – 2022-03-19 (×2): 40 mg via ORAL
  Filled 2022-03-18 (×2): qty 2

## 2022-03-18 MED ORDER — METOPROLOL TARTRATE 25 MG PO TABS
25.0000 mg | ORAL_TABLET | Freq: Two times a day (BID) | ORAL | Status: DC
  Administered 2022-03-18 (×2): 25 mg via ORAL
  Filled 2022-03-18 (×3): qty 1

## 2022-03-18 MED ORDER — FAMOTIDINE 20 MG PO TABS
20.0000 mg | ORAL_TABLET | Freq: Every day | ORAL | Status: DC
  Administered 2022-03-18: 20 mg via ORAL
  Filled 2022-03-18 (×2): qty 1

## 2022-03-18 MED ORDER — ALBUMIN HUMAN 25 % IV SOLN
50.0000 g | Freq: Once | INTRAVENOUS | Status: AC
  Administered 2022-03-18: 50 g via INTRAVENOUS
  Filled 2022-03-18: qty 200

## 2022-03-18 NOTE — Progress Notes (Signed)
PROGRESS NOTE    [name]  UGQ:[id number] DOB: 1933/01/07 DOA: 03/08/2022 PCP: Dettinger, [name], MD   Brief Narrative:  [name] is a 89 y.o. WM PMHx Ascending aorta aneurysm, atrial fibrillation, CAD, GERD, COPD, depression,   Presents to ED for sob , blood tinged  productive sputum since few weeks. Patient is a poor historian and no family at bedside. He was recently admitted for RSV bronchitis, and discharged to ALF. He is chronically on  4 lit of [place] oxygen. H presents this admission for sob and an episode of hemoptysis and worsening pedal edema. He was admitted for acute on chronic respiratory failure with hypoxia sec to combination of pneumonia and CHF.  Hospital course complicated by aspiration and vomiting, requiring heated high flow oxygen. He was transferred to stepdown for closer monitoring. PCCM consulted for recommendations. Advanced directives in place with DNR/DNI. Waiting for clinical improvement.      Subjective: 1/19 A/O x 4, more comfortable now that his HR better controlled (still not optimal).   Assessment & Plan:  Covid vaccination;  Principal Problem:   Acute on chronic respiratory failure with hypoxia (HCC) Active Problems:   Paroxysmal atrial fibrillation (HCC)   Bronchiectasis (HCC)   Lobar pneumonia, unspecified organism (HCC)   Malnutrition of moderate degree   Acute on chronic diastolic CHF (congestive heart failure) (HCC)   Hypotension, unspecified   CAD in native artery   Anemia of chronic disease   Bronchiectasis with (acute) exacerbation (HCC)  Acute on chronic respiratory failure with hypoxia -Multifactorial see below.  Multifocal pneumonia -Complete course 7-day course of antibiotics - Flutter valve - Incentive spirometry - DuoNeb QID -Xopenex PRN -Solu-Medrol 80 mg daily---> PCCM changed to Prednisone 40 mg daily - Hypertonic saline nebs BID -Chest Physiotherapy via bed QID -Sputum pending - Viral cultures negative to  date -1/18  PCXR; shows slight improvement RML, LEFT costovertebral angle however overall no significant improvement.  -1/19  Bronchiectasis -1/15 CXR Persistent interstitial and airspace opacities in the lungs bilaterally  Leukocytosis - 1/18 afebrile, negative left shift, negative bands.  Most likely secondary to steroids    Acute on Chronic Diastolic CHF  -BNP elevated, pedal edema present on admission -Lasix IV 20 mg BID (hold) increased creatinine/soft BP.  -Strict in and out +2.4 L - Daily weight Filed Weights   03/13/22 1500 03/14/22 0500 03/18/22 0500  Weight: 70.3 kg 67 kg 71.6 kg  -Last echocardiogram 02/07/2022 preserved EF, diastolic dysfunction cannot be assessed    Hypotension:  - 1/18 Midodrine 2.5 mg BID -1/19 Albumin 50 g x 1   Paroxysmal atrial fibrillation with RVR -Currently not rate controlled -Not on anticoagulation due to GI bleed - 1/18 EKG see/W A-fib with RVR -1/18 Toprol 50 mg daily (home dose), ADDENDUM unable to tolerate large pills therefore cannot swallow Toprol. - 1/18 Amiodarone drip -1/19 HR still not optimal however BP borderline.  After patient receives albumin may consider increasing beta-blocker, or giving patient low-dose digoxin -1/19 Metoprolol 25 mg BID  History of coronary artery disease -Troponins negative -Negative chest pain    Hypothyroidism -Synthroid 75 mcg daily     GERD Continue with PPI     Hypokalemia -Potassium goal> 4.   Hypocalcemia - calcium goal> 8.9 - 1/19 corrected calcium = 8.8  - 1/19 calcium gluconate 2 g     Anemia of chronic disease -Hemoglobin 9-11   -Anemia panel showing low iron .  -Completion of antibiotics iron + vitamin C  supplements .  Lab Results  Component Value Date   HGB 9.8 (L) 03/18/2022   HGB 10.5 (L) 03/17/2022   HGB 10.3 (L) 03/16/2022   HGB 9.3 (L) 03/15/2022   HGB 11.6 (L) 03/14/2022  -Stable transfuse for hemoglobin <7    Mobility Assessment (last 72 hours)      Mobility Assessment     Row Name 03/16/22 1321           What is the highest level of mobility based on the progressive mobility assessment? Level 3 (Stands with assist) - Balance while standing  and cannot march in place                DVT prophylaxis: Lovenox Code Status: DNR Family Communication:  Status is: Inpatient    Dispo: The patient is from: Home              Anticipated d/c is to: SNF              Anticipated d/c date is: > 3 days              Patient currently is not medically stable to d/c.      Consultants:  PCCM   Procedures/Significant Events:  1/18  PCXR; shows slight improvement RML, LEFT costovertebral angle however overall no significant improvement.   I have personally reviewed and interpreted all radiology studies and my findings are as above.  VENTILATOR SETTINGS: HFNC 1/19 Flow 25 L/min FiO2 40% SpO2 92%    Cultures COVID PCR, influenza PCR and RVP, strep pneumonia have been negative. Legionella antigen is negative 1/13 influenza A/B negative 1/13 RSV negative 1/13 respiratory virus panel negative 1/13 SARS coronavirus negative 1/14 sputum positive few GNR   Antimicrobials: Anti-infectives (From admission, onward)    Start     Ordered Stop   03/16/22 1000  azithromycin (ZITHROMAX) 500 mg in sodium chloride 0.9 % 250 mL IVPB        03/15/22 1307 03/18/22 0959   03/15/22 1200  azithromycin (ZITHROMAX) 500 mg in sodium chloride 0.9 % 250 mL IVPB  Status:  Discontinued        03/15/22 0944 03/15/22 1307   03/15/22 1000  azithromycin (ZITHROMAX) tablet 500 mg  Status:  Discontinued        03/14/22 1142 03/15/22 0944   03/15/22 1000  metroNIDAZOLE (FLAGYL) IVPB 500 mg        03/15/22 0906 03/20/22 0959   03/13/22 1130  azithromycin (ZITHROMAX) 500 mg in sodium chloride 0.9 % 250 mL IVPB  Status:  Discontinued        03/13/22 1126 03/14/22 1142   03/27/2022 1830  vancomycin (VANCOCIN) IVPB 1000 mg/200 mL premix  Status:   Discontinued        03/23/2022 1740 03/14/22 1147   03/17/2022 1800  ceFEPIme (MAXIPIME) 2 g in sodium chloride 0.9 % 100 mL IVPB        03/09/2022 1740           Devices    LINES / TUBES:      Continuous Infusions:  sodium chloride     amiodarone 30 mg/hr (03/18/22 0700)   ceFEPime (MAXIPIME) IV Stopped (03/18/22 2505)   metronidazole Stopped (03/17/22 2215)     Objective: Vitals:   03/18/22 0500 03/18/22 0600 03/18/22 0700 03/18/22 0741  BP: 110/78 (!) 114/90 112/64   Pulse: 84 97 82   Resp: 17 (!) 23 (!) 23   Temp: 97.8 F (36.6 C)  TempSrc:      SpO2: 94% 92% 95% 93%  Weight: 71.6 kg     Height:        Intake/Output Summary (Last 24 hours) at 03/18/2022 0754 Last data filed at 03/18/2022 0700 Gross per 24 hour  Intake 1127.83 ml  Output 500 ml  Net 627.83 ml    Filed Weights   03/13/22 1500 03/14/22 0500 03/18/22 0500  Weight: 70.3 kg 67 kg 71.6 kg   Physical Exam:  General: A/O x 4, positive acute on chronic respiratory distress Eyes: negative scleral hemorrhage, negative anisocoria, negative icterus ENT: Negative Runny nose, negative gingival bleeding, Neck:  Negative scars, masses, torticollis, lymphadenopathy, JVD Lungs: diffuse rhonchi, negative  wheezes or crackles Cardiovascular: Irregular irregular rhythm and rate without murmur gallop or rub normal S1 and S2 Abdomen: negative abdominal pain, nondistended, positive soft, bowel sounds, no rebound, no ascites, no appreciable mass Extremities: No significant cyanosis, clubbing, or edema bilateral lower extremities Skin: Negative rashes, lesions, ulcers Psychiatric:  Negative depression, negative anxiety, negative fatigue, negative mania  Central nervous system:  Cranial nerves II through XII intact, tongue/uvula midline, all extremities muscle strength 5/5, sensation intact throughout, negative dysarthria, negative expressive aphasia, negative receptive aphasia.       Data Reviewed: Care during  the described time interval was provided by me .  I have reviewed this patient's available data, including medical history, events of note, physical examination, and all test results as part of my evaluation.   CBC: Recent Labs  Lab 03/14/22 0247 03/15/22 0249 03/16/22 0245 03/17/22 0232 03/18/22 0238  WBC 12.1* 11.7* 9.0 17.8* 17.2*  NEUTROABS 8.2* 7.7 7.5 14.8* 14.3*  HGB 11.6* 9.3* 10.3* 10.5* 9.8*  HCT 37.3* 30.4* 32.9* 33.5* 31.1*  MCV 91.2 91.3 89.6 90.5 90.4  PLT 338 307 326 349 381    Basic Metabolic Panel: Recent Labs  Lab 03/14/22 0247 03/15/22 0249 03/16/22 0245 03/17/22 0232 03/18/22 0238  NA 138 136 133* 137 135  K 3.2* 3.4* 4.0 4.0 3.6  CL 102 106 106 108 110  CO2 26 24 20* 20* 21*  GLUCOSE 95 93 135* 122* 116*  BUN 24* 22 22 26* 27*  CREATININE 1.47* 1.11 0.99 1.06 0.92  CALCIUM 8.1* 7.6* 7.7* 8.4* 7.6*  MG 2.3 2.2 2.2 2.5* 2.5*  PHOS 4.8* 3.0 3.4 3.1 3.1    GFR: Estimated Creatinine Clearance: 51 mL/min (by C-G formula based on SCr of 0.92 mg/dL). Liver Function Tests: Recent Labs  Lab 03/14/22 0247 03/15/22 0249 03/16/22 0245 03/17/22 0232 03/18/22 0238  AST 53* '30 30 24 24  '$ ALT 45* 33 33 31 27  ALKPHOS 56 42 52 50 44  BILITOT 0.9 0.8 0.7 0.7 0.4  PROT 6.2* 5.2* 6.3* 6.2* 5.8*  ALBUMIN 2.6* 2.2* 2.5* 2.6* 2.4*    No results for input(s): "LIPASE", "AMYLASE" in the last 168 hours. No results for input(s): "AMMONIA" in the last 168 hours. Coagulation Profile: No results for input(s): "INR", "PROTIME" in the last 168 hours. Cardiac Enzymes: No results for input(s): "CKTOTAL", "CKMB", "CKMBINDEX", "TROPONINI" in the last 168 hours. BNP (last 3 results) Recent Labs    03/10/22 0956  PROBNP 679.0*    HbA1C: No results for input(s): "HGBA1C" in the last 72 hours. CBG: No results for input(s): "GLUCAP" in the last 168 hours. Lipid Profile: No results for input(s): "CHOL", "HDL", "LDLCALC", "TRIG", "CHOLHDL", "LDLDIRECT" in the last 72  hours. Thyroid Function Tests: No results for input(s): "TSH", "T4TOTAL", "FREET4", "T3FREE", "  THYROIDAB" in the last 72 hours. Anemia Panel: No results for input(s): "VITAMINB12", "FOLATE", "FERRITIN", "TIBC", "IRON", "RETICCTPCT" in the last 72 hours.  Urine analysis:    Component Value Date/Time   COLORURINE YELLOW 12/03/2021 2212   APPEARANCEUR CLEAR 12/03/2021 2212   APPEARANCEUR Clear 09/20/2017 1337   LABSPEC 1.014 12/03/2021 2212   PHURINE 6.0 12/03/2021 2212   GLUCOSEU NEGATIVE 12/03/2021 2212   HGBUR NEGATIVE 12/03/2021 2212   BILIRUBINUR NEGATIVE 12/03/2021 2212   BILIRUBINUR Negative 09/20/2017 [place] 12/03/2021 2212   PROTEINUR NEGATIVE 12/03/2021 2212   UROBILINOGEN negative 12/31/2013 1022   UROBILINOGEN 0.2 04/18/2013 2026   NITRITE NEGATIVE 12/03/2021 2212   LEUKOCYTESUR NEGATIVE 12/03/2021 2212   Sepsis Labs: '@LABRCNTIP'$ (procalcitonin:4,lacticidven:4)  ) Recent Results (from the past 240 hour(s))  MRSA Next Gen by PCR, Nasal     Status: None   Collection Time: 02/28/2022  5:42 PM   Specimen: Nasal Mucosa; Nasal Swab  Result Value Ref Range Status   MRSA by PCR Next Gen NOT DETECTED NOT DETECTED Final    Comment: (NOTE) The GeneXpert MRSA Assay (FDA approved for NASAL specimens only), is one component of a comprehensive MRSA colonization surveillance program. It is not intended to diagnose MRSA infection nor to guide or monitor treatment for MRSA infections. Test performance is not FDA approved in patients less than 10 years old. Performed at [facility], [place] [address], [place], [place] [address]   Resp panel by RT-PCR (RSV, Flu A&B, Covid) Anterior Nasal Swab     Status: None   Collection Time: 03/16/2022  8:06 PM   Specimen: Anterior Nasal Swab  Result Value Ref Range Status   SARS Coronavirus 2 by RT PCR NEGATIVE NEGATIVE Final    Comment: (NOTE) SARS-CoV-2 target nucleic acids are NOT DETECTED.  The SARS-CoV-2 RNA is  generally detectable in upper respiratory specimens during the acute phase of infection. The lowest concentration of SARS-CoV-2 viral copies this assay can detect is 138 copies/mL. A negative result does not preclude SARS-Cov-2 infection and should not be used as the sole basis for treatment or other patient management decisions. A negative result may occur with  improper specimen collection/handling, submission of specimen other than nasopharyngeal swab, presence of viral mutation(s) within the areas targeted by this assay, and inadequate number of viral copies(<138 copies/mL). A negative result must be combined with clinical observations, patient history, and epidemiological information. The expected result is Negative.  Fact Sheet for Patients:  EntrepreneurPulse.com.au  Fact Sheet for Healthcare Providers:  IncredibleEmployment.be  This test is no t yet approved or cleared by the [place] FDA and  has been authorized for detection and/or diagnosis of SARS-CoV-2 by FDA under an Emergency Use Authorization (EUA). This EUA will remain  in effect (meaning this test can be used) for the duration of the COVID-19 declaration under Section 564(b)(1) of the Act, 21 U.S.C.section 360bbb-3(b)(1), unless the authorization is terminated  or revoked sooner.       Influenza A by PCR NEGATIVE NEGATIVE Final   Influenza B by PCR NEGATIVE NEGATIVE Final    Comment: (NOTE) The Xpert Xpress SARS-CoV-2/FLU/RSV plus assay is intended as an aid in the diagnosis of influenza from Nasopharyngeal swab specimens and should not be used as a sole basis for treatment. Nasal washings and aspirates are unacceptable for Xpert Xpress SARS-CoV-2/FLU/RSV testing.  Fact Sheet for Patients: EntrepreneurPulse.com.au  Fact Sheet for Healthcare Providers: IncredibleEmployment.be  This test is not yet approved or cleared  by the [place] and has been authorized for detection and/or diagnosis of SARS-CoV-2 by FDA under an Emergency Use Authorization (EUA). This EUA will remain in effect (meaning this test can be used) for the duration of the COVID-19 declaration under Section 564(b)(1) of the Act, 21 U.S.C. section 360bbb-3(b)(1), unless the authorization is terminated or revoked.     Resp Syncytial Virus by PCR NEGATIVE NEGATIVE Final    Comment: (NOTE) Fact Sheet for Patients: EntrepreneurPulse.com.au  Fact Sheet for Healthcare Providers: IncredibleEmployment.be  This test is not yet approved or cleared by the [place] FDA and has been authorized for detection and/or diagnosis of SARS-CoV-2 by FDA under an Emergency Use Authorization (EUA). This EUA will remain in effect (meaning this test can be used) for the duration of the COVID-19 declaration under Section 564(b)(1) of the Act, 21 U.S.C. section 360bbb-3(b)(1), unless the authorization is terminated or revoked.  Performed at [facility], [place] [address], [place], [place] [address]   Respiratory (~20 pathogens) panel by PCR     Status: None   Collection Time: 03/02/2022  8:06 PM   Specimen: Anterior Nasal Swab; Respiratory  Result Value Ref Range Status   Adenovirus NOT DETECTED NOT DETECTED Final   Coronavirus 229E NOT DETECTED NOT DETECTED Final    Comment: (NOTE) The Coronavirus on the Respiratory Panel, DOES NOT test for the novel  Coronavirus (2019 nCoV)    Coronavirus HKU1 NOT DETECTED NOT DETECTED Final   Coronavirus NL63 NOT DETECTED NOT DETECTED Final   Coronavirus OC43 NOT DETECTED NOT DETECTED Final   Metapneumovirus NOT DETECTED NOT DETECTED Final   Rhinovirus / Enterovirus NOT DETECTED NOT DETECTED Final   Influenza A NOT DETECTED NOT DETECTED Final   Influenza B NOT DETECTED NOT DETECTED Final   Parainfluenza Virus 1 NOT DETECTED NOT DETECTED Final   Parainfluenza Virus 2 NOT  DETECTED NOT DETECTED Final   Parainfluenza Virus 3 NOT DETECTED NOT DETECTED Final   Parainfluenza Virus 4 NOT DETECTED NOT DETECTED Final   Respiratory Syncytial Virus NOT DETECTED NOT DETECTED Final   Bordetella pertussis NOT DETECTED NOT DETECTED Final   Bordetella Parapertussis NOT DETECTED NOT DETECTED Final   Chlamydophila pneumoniae NOT DETECTED NOT DETECTED Final   Mycoplasma pneumoniae NOT DETECTED NOT DETECTED Final    Comment: Performed at [facility], [place] [address] [place], [place] [address]  Expectorated Sputum Assessment w Gram Stain, Rflx to Resp Cult     Status: None   Collection Time: 03/08/2022 11:14 PM   Specimen: Sputum  Result Value Ref Range Status   Specimen Description SPUTUM  Final   Special Requests NONE  Final   Sputum evaluation   Final    Sputum specimen not acceptable for testing.  Please recollect.   Results Called to: Ashok Pall., RN AT 8333 ON 03/10/2022 BY LJM Performed at [facility], [place] [address] [place], [place] [address]    Report Status 03/05/2022 FINAL  Final  Expectorated Sputum Assessment w Gram Stain, Rflx to Resp Cult     Status: None   Collection Time: 03/13/22  5:25 AM  Result Value Ref Range Status   Specimen Description EXPECTORATED SPUTUM  Final   Special Requests NONE  Final   Sputum evaluation   Final    THIS SPECIMEN IS ACCEPTABLE FOR SPUTUM CULTURE Performed at [facility], [name] [address] [place], [place] [address]    Report Status 03/13/2022 FINAL  Final  Culture, Respiratory w Gram Stain     Status: None   Collection Time: 03/13/22  5:25 AM  Result Value Ref Range Status   Specimen Description   Final    EXPECTORATED SPUTUM Performed at [facility] [address] [place], [place] [address]    Special Requests   Final    NONE Reflexed from [phone] Performed at [facility] [address] [place], [place] [address]    Gram Stain    Final    MODERATE WBC PRESENT, PREDOMINANTLY PMN FEW GRAM NEGATIVE RODS RARE SQUAMOUS EPITHELIAL CELLS PRESENT Performed at [facility], [place] [address] [place], [place] [address]    Culture RARE CANDIDA ALBICANS  Final   Report Status 03/16/2022 FINAL  Final         Radiology Studies: DG CHEST PORT 1 VIEW  Result Date: 03/17/2022 CLINICAL DATA:  Acute respiratory failure with hypoxia. EXAM: PORTABLE CHEST 1 VIEW COMPARISON:  Radiographs 03/14/2022 and 03/09/2022.  CT 03/23/2022. FINDINGS: 0446 hours. The heart size and mediastinal contours appear stable with mild cardiomegaly and aortic atherosclerosis. There is central prominence of the pulmonary arteries. Bilateral airspace opacities and small pleural effusions appear unchanged. No evidence of pneumothorax. No acute osseous findings are evident. Telemetry leads overlie the chest. IMPRESSION: No significant change in bilateral airspace opacities and small pleural effusions. Electronically Signed   By: Richardean Sale M.D.   On: 03/17/2022 08:21   DG ESOPHAGUS W SINGLE CM (SOL OR THIN BA)  Result Date: 03/16/2022 CLINICAL DATA:  History of esophageal stricture, hiatal hernia and esophagitis. Patient complains of solid-food and medication dysphagia. Patient referred for fluoroscopic single contrast esophagram study. EXAM: ESOPHAGUS/BARIUM SWALLOW/TABLET STUDY TECHNIQUE: Single contrast examination was performed using thin liquid barium. This exam was performed by Narda Rutherford, NP, and was supervised and interpreted by Richardean Sale, MD. FLUOROSCOPY: Radiation Exposure Index (as provided by the fluoroscopic device): 24.30 mGy Kerma COMPARISON:  Chest CTA 03/10/2022, chest radiographs 03/14/2022 and esophagram 08/10/2017 FINDINGS: Limited exam due to patient's inability to stand or position on the fluoroscopy table. No barium tablet administered. Swallowing: Appears normal. No vestibular penetration or aspiration seen. Pharynx: Prominent  cricopharyngeal bar noted on swallowing, similar to previous study. Esophagus: Esophagus is patulous in appearance with a distal esophageal stricture, similar to the previous examination. There is a persistent, well-circumscribed filling defect within the distal esophagus which is suspicious for a pedunculated polyp. This appears non ulcerated. Esophageal motility: Stable mild esophageal dysmotility with mild tertiary contractions. Hiatal Hernia: Small reducible hiatal hernia. Previous Nissen fundoplication. Gastroesophageal reflux: None visualized with provocation maneuvers. Ingested 60m barium tablet: 13 mm barium tablet not given due to patient's inability to swallow pills. Other: None. IMPRESSION: 1. Persistent/recurrent distal esophageal stricture, incompletely evaluated in this patient unable to swallow pills. 2. Smoothly marginated filling defect at the gastroesophageal junction, suspicious for a pedunculated polyp. 3. Unchanged prominent cricopharyngeus impression on the posterior wall of the esophagus and mild esophageal dysmotility. Electronically Signed   By: WRichardean SaleM.D.   On: 03/16/2022 10:38        Scheduled Meds:  Chlorhexidine Gluconate Cloth  6 each Topical Daily   enoxaparin (LOVENOX) injection  40 mg Subcutaneous Q24H   feeding supplement  237 mL Oral Q24H   hydrocortisone cream   Topical TID   ipratropium-albuterol  3 mL Nebulization QID   lactose free nutrition  237 mL Oral Q24H   levothyroxine  75 mcg Oral Q0600  methylPREDNISolone (SOLU-MEDROL) injection  80 mg Intravenous Daily   metoprolol succinate  50 mg Oral Q breakfast   midodrine  2.5 mg Oral BID WC   mouth rinse  15 mL Mouth Rinse 4 times per day   pantoprazole (PROTONIX) IV  40 mg Intravenous Q24H   sodium chloride HYPERTONIC  4 mL Nebulization BID   sucralfate  1 g Oral TID WC & HS   Continuous Infusions:  sodium chloride     amiodarone 30 mg/hr (03/18/22 0700)   ceFEPime (MAXIPIME) IV Stopped  (03/18/22 8016)   metronidazole Stopped (03/17/22 2215)     LOS: 6 days   The patient is critically ill with multiple organ systems failure and requires high complexity decision making for assessment and support, frequent evaluation and titration of therapies, application of advanced monitoring technologies and extensive interpretation of multiple databases. Critical Care Time devoted to patient care services described in this note  Time spent: 40 minutes     Goldye Tourangeau, Geraldo Docker, MD [facility]   If 7PM-7AM, please contact night-coverage 03/18/2022, 7:54 AM

## 2022-03-18 NOTE — Progress Notes (Signed)
NAME:  [name], MRN:  [id number], DOB:  10/27/1932, LOS: 6 ADMISSION DATE:  03/09/2022, CONSULTATION DATE:  03/18/22 REFERRING MD:  Karleen Hampshire, CHIEF COMPLAINT:  Hypotension   History of Present Illness:  89 y/o M with PMH significant for bronchiectasis-never smoker and followed by Dr. Loanne Drilling on 2-5L home O2, AAA, atrial fibrillation, CAD, Barrett's esophagus, pituitary macroadenoma with recent RSV bronchitis admission and discharged to SNF on 02/10/22 who presented to the ED on 1/13 with several weeks of blood tinged sputum.    He was seen in the pulmonary office four days prior to admission with similar symptoms and prescribed Levaquin and Prednisone.   CXR with diffuse bilateral ground glass opacities and LUL infiltrate consistent with multifocal PNA.  He was admitted and treated with vancomycin and cefepime. COVID, flu and RSV and RVP all negative.  His BNP was elevated so was started on Lasix '20mg'$  bid and received metoprolol this morning.  Later in the day his blood pressure down-trended and PCCM consulted.  MAP 55-60, states that his head feels "fuzzy" and he is tired, otherwise is feeling improved.     Pertinent  Medical History   has a past medical history of Abnormality of gait (06/07/2013), Anemia, Aneurysm ([place]), Anxiety, Arthritis, Asthma, Atrial fibrillation ([place]), B12 deficiency, Barrett's esophagus, CAD (coronary artery disease) (03/2006), Cancer ([place]), Cataract, COLONIC POLYPS (63/87/5643), Complication of anesthesia, COPD (chronic obstructive pulmonary disease) ([place]), Depression, Diverticulosis, Emphysema, Enlarged prostate with urinary retention, Esophageal stenosis, Gastric ulcer, GERD (gastroesophageal reflux disease), Hiatal hernia, Hypothyroidism, Neuropathy, Osteoporosis, Pituitary macroadenoma ([facility]), Restless legs syndrome (RLS), Tubular adenoma of colon, and UGIB (upper gastrointestinal bleed) (03/2013).   Significant Hospital Events: Including procedures, antibiotic start  and stop dates in addition to other pertinent events   1/13 admit, started on vanc/cefepime 1/15 PCCM consult, added azithryomycin 1/16 coughing/possible aspiration event overnight, on HFNC and peripheral levophed, O2 increased from 10L to 35L HFNC, coughing after eating, on 36mg levophed  1/18 - Off vasopressors, started on Amio gtt  1/19- weaned HHFNC 20L/40Fio2  Interim History / Subjective:  Pt resting in the bed, not complaining of any pain. He denies any shortness of breath and is asking when he will go home.    Objective   Blood pressure 112/64, pulse 82, temperature 97.8 F (36.6 C), resp. rate (!) 23, height '5\' 6"'$  (1.676 m), weight 71.6 kg, SpO2 93 %.    FiO2 (%):  [40 %-45 %] 45 %   Intake/Output Summary (Last 24 hours) at 03/18/2022 0745 Last data filed at 03/18/2022 0700 Gross per 24 hour  Intake 1127.83 ml  Output 500 ml  Net 627.83 ml   Filed Weights   03/13/22 1500 03/14/22 0500 03/18/22 0500  Weight: 70.3 kg 67 kg 71.6 kg   Physical Examination: General: Chronically ill-appearing elderly male in NAD, resting in bed.  HEENT: [place]/AT, anicteric sclera, PERRL, moist mucous membranes. Neuro: Awake, oriented x 4. Responds to verbal stimuli. Following commands consistently. Moves all 4 extremities spontaneously.  CV: Afib, no m/g/r. PULM: Breathing even and unlabored on HHFNC 20L/40% Fio2. Lung fields Rhonchi in RUL, Diminished in the bases bilaterally. .Marland KitchenGI: Soft, nontender, nondistended. Normoactive bowel sounds. Extremities: no LE edema noted. Skin: Warm/dry, no rashes.  Afebrile Afib Rate controlled on Amio gtt  On HHFNC o2 > 20L/40% Fio2   Resolved Hospital Problem list   Hypotension   Assessment & Plan:   Hypotension > resolved - normotensive  MAP 55-60, on review of previous admissions it  appears his BP is usually 100/60's Possibly from beta blocker and diuresis. Received IVF and albumin 1/15 - Midodrine 2.5 TID   Acute on Chronic Hypoxic Respiratory  Failure likely secondary to bronchiectasis exacerbation and multi-focal pneumonia Concern for aspiration > Requiring less o2 requirements today > 20L/40% Fio2, will aim to wean to [place] today > Sputum Cx: few GNR's and candidia albicans  - Cont flutter valve and IS - Cont xopenex PRN  - Cont Hypertonic nebs - Cont Chest PT - Cont antibiotics 7 day course  - Aspiration Precautions - Encourage out of bed mobilization - Follow intermittent CXR   - Still receiving methylprednisolone '80mg'$  IV, will switch to 40 mg Daily PO - SLP reccommended crushed meds and soft foods (Dys2- Dys3) and thin liquids.   Afib Plan  - On Amio gtt per primary team   Best Practice (right click and "Reselect all SmartList Selections" daily)  Per primary DNR/DNI   Critical care time:      Erma Heritage, NP-S Cct deferred to Attending

## 2022-03-18 NOTE — Progress Notes (Signed)
RT Note: HHFNC remains STBY in room/pt. on 6 lpm [place].

## 2022-03-18 NOTE — Progress Notes (Signed)
[facility] ADULT ICU REPLACEMENT PROTOCOL   The patient does apply for the [facility] Adult ICU Electrolyte Replacment Protocol based on the criteria listed below:   1.Exclusion criteria: TCTS, ECMO, Dialysis, and Myasthenia Gravis patients 2. Is GFR >/= 30 ml/min? Yes.    Patient's GFR today is >60 3. Is SCr </= 2? Yes.   Patient's SCr is 0.92 mg/dL 4. Did SCr increase >/= 0.5 in 24 hours? No. 5.Pt's weight >40kg  Yes.   6. Abnormal electrolyte(s): potassium 3.6  7. Electrolytes replaced per protocol 8.  Call MD STAT for K+ </= 2.5, Phos </= 1, or Mag </= 1 Physician:  n/a  Darlys Gales 03/18/2022 4:45 AM

## 2022-03-19 DIAGNOSIS — I2510 Atherosclerotic heart disease of native coronary artery without angina pectoris: Secondary | ICD-10-CM | POA: Diagnosis not present

## 2022-03-19 DIAGNOSIS — R52 Pain, unspecified: Secondary | ICD-10-CM

## 2022-03-19 DIAGNOSIS — Z66 Do not resuscitate: Secondary | ICD-10-CM

## 2022-03-19 DIAGNOSIS — J181 Lobar pneumonia, unspecified organism: Secondary | ICD-10-CM | POA: Diagnosis not present

## 2022-03-19 DIAGNOSIS — Z7189 Other specified counseling: Secondary | ICD-10-CM

## 2022-03-19 DIAGNOSIS — Z515 Encounter for palliative care: Secondary | ICD-10-CM | POA: Diagnosis not present

## 2022-03-19 DIAGNOSIS — J9621 Acute and chronic respiratory failure with hypoxia: Secondary | ICD-10-CM | POA: Diagnosis not present

## 2022-03-19 DIAGNOSIS — Z79899 Other long term (current) drug therapy: Secondary | ICD-10-CM

## 2022-03-19 DIAGNOSIS — J471 Bronchiectasis with (acute) exacerbation: Secondary | ICD-10-CM | POA: Diagnosis not present

## 2022-03-19 LAB — COMPREHENSIVE METABOLIC PANEL
ALT: 32 U/L (ref 0–44)
AST: 32 U/L (ref 15–41)
Albumin: 2.9 g/dL — ABNORMAL LOW (ref 3.5–5.0)
Alkaline Phosphatase: 46 U/L (ref 38–126)
Anion gap: 6 (ref 5–15)
BUN: 29 mg/dL — ABNORMAL HIGH (ref 8–23)
CO2: 21 mmol/L — ABNORMAL LOW (ref 22–32)
Calcium: 8.4 mg/dL — ABNORMAL LOW (ref 8.9–10.3)
Chloride: 109 mmol/L (ref 98–111)
Creatinine, Ser: 0.9 mg/dL (ref 0.61–1.24)
GFR, Estimated: >60 mL/min (ref >60)
Glucose, Bld: 98 mg/dL (ref 70–99)
Potassium: 4 mmol/L (ref 3.5–5.1)
Sodium: 136 mmol/L (ref 135–145)
Total Bilirubin: 0.5 mg/dL (ref 0.3–1.2)
Total Protein: 6 g/dL — ABNORMAL LOW (ref 6.5–8.1)

## 2022-03-19 LAB — CBC WITH DIFFERENTIAL/PLATELET
Abs Immature Granulocytes: 1.43 10*3/uL — ABNORMAL HIGH (ref 0.00–0.07)
Basophils Absolute: 0 10*3/uL (ref 0.0–0.1)
Basophils Relative: 0 %
Eosinophils Absolute: 0 10*3/uL (ref 0.0–0.5)
Eosinophils Relative: 0 %
HCT: 32.6 % — ABNORMAL LOW (ref 39.0–52.0)
Hemoglobin: 9.7 g/dL — ABNORMAL LOW (ref 13.0–17.0)
Immature Granulocytes: 8 %
Lymphocytes Relative: 4 %
Lymphs Abs: 0.8 10*3/uL (ref 0.7–4.0)
MCH: 27.7 pg (ref 26.0–34.0)
MCHC: 29.8 g/dL — ABNORMAL LOW (ref 30.0–36.0)
MCV: 93.1 fL (ref 80.0–100.0)
Monocytes Absolute: 1.6 10*3/uL — ABNORMAL HIGH (ref 0.1–1.0)
Monocytes Relative: 9 %
Neutro Abs: 14.1 10*3/uL — ABNORMAL HIGH (ref 1.7–7.7)
Neutrophils Relative %: 79 %
Platelets: 313 10*3/uL (ref 150–400)
RBC: 3.5 MIL/uL — ABNORMAL LOW (ref 4.22–5.81)
RDW: 18.7 % — ABNORMAL HIGH (ref 11.5–15.5)
WBC: 17.9 10*3/uL — ABNORMAL HIGH (ref 4.0–10.5)
nRBC: 0 % (ref 0.0–0.2)

## 2022-03-19 LAB — MAGNESIUM: Magnesium: 2.3 mg/dL (ref 1.7–2.4)

## 2022-03-19 LAB — PHOSPHORUS: Phosphorus: 2.4 mg/dL — ABNORMAL LOW (ref 2.5–4.6)

## 2022-03-19 MED ORDER — POLYVINYL ALCOHOL 1.4 % OP SOLN: 1.0000 [drp] | Freq: Four times a day (QID) | OPHTHALMIC | Status: DC | PRN

## 2022-03-19 MED ORDER — MORPHINE 100MG IN NS 100ML (1MG/ML) PREMIX INFUSION
3.0000 mg/h | INTRAVENOUS | Status: DC
  Administered 2022-03-19: 2 mg/h via INTRAVENOUS
  Administered 2022-03-20: 3 mg/h via INTRAVENOUS
  Filled 2022-03-19 (×2): qty 100

## 2022-03-19 MED ORDER — BIOTENE DRY MOUTH MT LIQD: 15.0000 mL | OROMUCOSAL | Status: DC | PRN

## 2022-03-19 MED ORDER — GLYCOPYRROLATE 0.2 MG/ML IJ SOLN
0.2000 mg | INTRAMUSCULAR | Status: DC | PRN
  Administered 2022-03-19 – 2022-03-21 (×3): 0.2 mg via INTRAVENOUS
  Filled 2022-03-19 (×3): qty 1

## 2022-03-19 MED ORDER — MORPHINE BOLUS VIA INFUSION
3.0000 mg | INTRAVENOUS | Status: DC | PRN
  Administered 2022-03-19 (×2): 3 mg via INTRAVENOUS

## 2022-03-19 MED ORDER — LORAZEPAM 2 MG/ML IJ SOLN: 1.0000 mg | INTRAMUSCULAR | Status: DC | PRN

## 2022-03-19 MED ORDER — HALOPERIDOL LACTATE 5 MG/ML IJ SOLN: 0.5000 mg | INTRAMUSCULAR | Status: DC | PRN

## 2022-03-19 NOTE — Progress Notes (Signed)
PT requesting that scheduled neb treatments and CPT be discontinued. RN also aware.

## 2022-03-19 NOTE — Progress Notes (Signed)
PROGRESS NOTE    [name]  ZSW:[id number] DOB: [birth year] DOA: 03/28/2022 PCP: Dettinger, [name], MD   Brief Narrative:  [name] is a 89 y.o. WM PMHx Ascending aorta aneurysm, atrial fibrillation, CAD, GERD, COPD, depression,   Presents to ED for sob , blood tinged  productive sputum since few weeks. Patient is a poor historian and no family at bedside. He was recently admitted for RSV bronchitis, and discharged to ALF. He is chronically on  4 lit of [place] oxygen. H presents this admission for sob and an episode of hemoptysis and worsening pedal edema. He was admitted for acute on chronic respiratory failure with hypoxia sec to combination of pneumonia and CHF.  Hospital course complicated by aspiration and vomiting, requiring heated high flow oxygen. He was transferred to stepdown for closer monitoring. PCCM consulted for recommendations. Advanced directives in place with DNR/DNI. Waiting for clinical improvement.      Subjective: 1/20 afebrile overnight A/O x 4.  Patient states he would like to be comfort care.  States he understands exactly what comfort care is as his wife was comfort care prior to her death.  States he is ready to be reunite with his wife.  Requests that we contact his daughter to inform her of his decision.   Assessment & Plan:  Covid vaccination;  Principal Problem:   Acute on chronic respiratory failure with hypoxia (HCC) Active Problems:   Paroxysmal atrial fibrillation (HCC)   Bronchiectasis (HCC)   Lobar pneumonia, unspecified organism (HCC)   Malnutrition of moderate degree   Acute on chronic diastolic CHF (congestive heart failure) (HCC)   Hypotension, unspecified   CAD in native artery   Anemia of chronic disease   Bronchiectasis with (acute) exacerbation (HCC)  Acute on chronic respiratory failure with hypoxia -Multifactorial see below.  Multifocal pneumonia -Complete course 7-day course of antibiotics - Flutter valve - Incentive  spirometry - DuoNeb QID -Xopenex PRN -Solu-Medrol 80 mg daily---> PCCM changed to Prednisone 40 mg daily - Hypertonic saline nebs BID -Chest Physiotherapy via bed QID -Sputum pending - Viral cultures negative to date -1/18  PCXR; shows slight improvement RML, LEFT costovertebral angle however overall no significant improvement.  -1/20 this a.m. patient informed me he wished to change his care to comfort care  Bronchiectasis -1/15 CXR Persistent interstitial and airspace opacities in the lungs bilaterally  Leukocytosis - 1/18 afebrile, negative left shift, negative bands.  Most likely secondary to steroids    Acute on Chronic Diastolic CHF  -BNP elevated, pedal edema present on admission -Lasix IV 20 mg BID (hold) increased creatinine/soft BP.  -Strict in and out +2.4 L - Daily weight Filed Weights   03/14/22 0500 03/18/22 0500 03/19/22 0500  Weight: 67 kg 71.6 kg 73.3 kg  -Last echocardiogram 02/07/2022 preserved EF, diastolic dysfunction cannot be assessed -1/20 this a.m. patient informed me he wished to change his care to comfort care   Hypotension:  - 1/18 Midodrine 2.5 mg BID -1/19 Albumin 50 g x 1   Paroxysmal atrial fibrillation with RVR -Currently not rate controlled -Not on anticoagulation due to GI bleed - 1/18 EKG see/W A-fib with RVR -1/18 Toprol 50 mg daily (home dose), ADDENDUM unable to tolerate large pills therefore cannot swallow Toprol. - 1/18 Amiodarone drip -1/19 HR still not optimal however BP borderline.  After patient receives albumin may consider increasing beta-blocker, or giving patient low-dose digoxin -1/19 Metoprolol 25 mg BID -1/20 this a.m. patient informed me he wished to  change his care to comfort care  History of coronary artery disease -Troponins negative -Negative chest pain    Hypothyroidism -Synthroid 75 mcg daily     GERD Continue with PPI     Hypokalemia -Potassium goal> 4.   Hypocalcemia - calcium goal> 8.9 - 1/19  corrected calcium = 8.8  - 1/19 calcium gluconate 2 g     Anemia of chronic disease -Hemoglobin 9-11   -Anemia panel showing low iron .  -Completion of antibiotics iron + vitamin C supplements .  Lab Results  Component Value Date   HGB 9.7 (L) 03/19/2022   HGB 9.8 (L) 03/18/2022   HGB 10.5 (L) 03/17/2022   HGB 10.3 (L) 03/16/2022   HGB 9.3 (L) 03/15/2022  -Stable transfuse for hemoglobin <7   Goals of care - 1/20 per patient's request spoke at length with Clarisse Gouge (daughter) to inform her of his decision to change his care plan to comfort care.  Answered all questions.  Informed her that Dr. Vinetta Bergamo (palliative care) would be in touch with patient and family to discuss comfort care options. -Dr. Vinetta Bergamo (palliative care) is awaiting arrival of son and daughter and once patient off of all medications and they have said their goodbyes will decide on residential hospice vs home hospice.  Will await her recommendation. -1/20 this a.m. patient informed me he wished to change his care to comfort care   Mobility Assessment (last 72 hours)     Mobility Assessment     Row Name 03/16/22 1321           What is the highest level of mobility based on the progressive mobility assessment? Level 3 (Stands with assist) - Balance while standing  and cannot march in place                DVT prophylaxis: Lovenox Code Status: DNR Family Communication:  Status is: Inpatient    Dispo: The patient is from: Home              Anticipated d/c is to: SNF              Anticipated d/c date is: > 3 days              Patient currently is not medically stable to d/c.      Consultants:  PCCM   Procedures/Significant Events:  1/18  PCXR; shows slight improvement RML, LEFT costovertebral angle however overall no significant improvement.   I have personally reviewed and interpreted all radiology studies and my findings are as above.  VENTILATOR SETTINGS: HFNC 1/20 Flow 4 L/min SpO2  97%    Cultures COVID PCR, influenza PCR and RVP, strep pneumonia have been negative. Legionella antigen is negative 1/13 influenza A/B negative 1/13 RSV negative 1/13 respiratory virus panel negative 1/13 SARS coronavirus negative 1/14 sputum positive few GNR   Antimicrobials: Anti-infectives (From admission, onward)    Start     Ordered Stop   03/16/22 1000  azithromycin (ZITHROMAX) 500 mg in sodium chloride 0.9 % 250 mL IVPB        03/15/22 1307 03/18/22 0959   03/15/22 1200  azithromycin (ZITHROMAX) 500 mg in sodium chloride 0.9 % 250 mL IVPB  Status:  Discontinued        03/15/22 0944 03/15/22 1307   03/15/22 1000  azithromycin (ZITHROMAX) tablet 500 mg  Status:  Discontinued        03/14/22 1142 03/15/22 0944   03/15/22 1000  metroNIDAZOLE (FLAGYL)  IVPB 500 mg        03/15/22 0906 03/20/22 0959   03/13/22 1130  azithromycin (ZITHROMAX) 500 mg in sodium chloride 0.9 % 250 mL IVPB  Status:  Discontinued        03/13/22 1126 03/14/22 1142   03/20/2022 1830  vancomycin (VANCOCIN) IVPB 1000 mg/200 mL premix  Status:  Discontinued        03/02/2022 1740 03/14/22 1147   03/25/2022 1800  ceFEPIme (MAXIPIME) 2 g in sodium chloride 0.9 % 100 mL IVPB        03/14/2022 1740           Devices    LINES / TUBES:      Continuous Infusions:  sodium chloride     amiodarone 30 mg/hr (03/19/22 0700)   ceFEPime (MAXIPIME) IV Stopped (03/19/22 0535)   metronidazole Stopped (03/18/22 2220)     Objective: Vitals:   03/19/22 0600 03/19/22 0700 03/19/22 0752 03/19/22 0845  BP: 113/64 112/69    Pulse: 80 71    Resp: (!) 28 (!) 24    Temp:    97.7 F (36.5 C)  TempSrc:    Axillary  SpO2: 94% 95% 96%   Weight:      Height:        Intake/Output Summary (Last 24 hours) at 03/19/2022 1001 Last data filed at 03/19/2022 0700 Gross per 24 hour  Intake 1203.71 ml  Output 650 ml  Net 553.71 ml    Filed Weights   03/14/22 0500 03/18/22 0500 03/19/22 0500  Weight: 67 kg 71.6 kg 73.3  kg   Physical Exam:  General: A/O x 4, positive acute on chronic respiratory distress Eyes: negative scleral hemorrhage, negative anisocoria, negative icterus ENT: Negative Runny nose, negative gingival bleeding, Neck:  Negative scars, masses, torticollis, lymphadenopathy, JVD Lungs: tachypneic diffuse rhonchi, negative  wheezes or crackles Cardiovascular: Irregular irregular rhythm and rate without murmur gallop or rub normal S1 and S2 Abdomen: negative abdominal pain, nondistended, positive soft, bowel sounds, no rebound, no ascites, no appreciable mass Extremities: No significant cyanosis, clubbing, or edema bilateral lower extremities Skin: Negative rashes, lesions, ulcers Psychiatric:  Negative depression, negative anxiety, negative fatigue, negative mania  Central nervous system:  Cranial nerves II through XII intact, tongue/uvula midline, all extremities muscle strength 5/5, sensation intact throughout, negative dysarthria, negative expressive aphasia, negative receptive aphasia.       Data Reviewed: Care during the described time interval was provided by me .  I have reviewed this patient's available data, including medical history, events of note, physical examination, and all test results as part of my evaluation.   CBC: Recent Labs  Lab 03/15/22 0249 03/16/22 0245 03/17/22 0232 03/18/22 0238 03/19/22 0248  WBC 11.7* 9.0 17.8* 17.2* 17.9*  NEUTROABS 7.7 7.5 14.8* 14.3* 14.1*  HGB 9.3* 10.3* 10.5* 9.8* 9.7*  HCT 30.4* 32.9* 33.5* 31.1* 32.6*  MCV 91.3 89.6 90.5 90.4 93.1  PLT 307 326 349 316 419    Basic Metabolic Panel: Recent Labs  Lab 03/15/22 0249 03/16/22 0245 03/17/22 0232 03/18/22 0238 03/19/22 0248  NA 136 133* 137 135 136  K 3.4* 4.0 4.0 3.6 4.0  CL 106 106 108 110 109  CO2 24 20* 20* 21* 21*  GLUCOSE 93 135* 122* 116* 98  BUN 22 22 26* 27* 29*  CREATININE 1.11 0.99 1.06 0.92 0.90  CALCIUM 7.6* 7.7* 8.4* 7.6* 8.4*  MG 2.2 2.2 2.5* 2.5* 2.3  PHOS  3.0 3.4 3.1 3.1 2.4*  GFR: Estimated Creatinine Clearance: 52.2 mL/min (by C-G formula based on SCr of 0.9 mg/dL). Liver Function Tests: Recent Labs  Lab 03/15/22 0249 03/16/22 0245 03/17/22 0232 03/18/22 0238 03/19/22 0248  AST '30 30 24 24 '$ 32  ALT 33 33 31 27 32  ALKPHOS 42 52 50 44 46  BILITOT 0.8 0.7 0.7 0.4 0.5  PROT 5.2* 6.3* 6.2* 5.8* 6.0*  ALBUMIN 2.2* 2.5* 2.6* 2.4* 2.9*    No results for input(s): "LIPASE", "AMYLASE" in the last 168 hours. No results for input(s): "AMMONIA" in the last 168 hours. Coagulation Profile: No results for input(s): "INR", "PROTIME" in the last 168 hours. Cardiac Enzymes: No results for input(s): "CKTOTAL", "CKMB", "CKMBINDEX", "TROPONINI" in the last 168 hours. BNP (last 3 results) Recent Labs    03/10/22 0956  PROBNP 679.0*    HbA1C: No results for input(s): "HGBA1C" in the last 72 hours. CBG: No results for input(s): "GLUCAP" in the last 168 hours. Lipid Profile: No results for input(s): "CHOL", "HDL", "LDLCALC", "TRIG", "CHOLHDL", "LDLDIRECT" in the last 72 hours. Thyroid Function Tests: No results for input(s): "TSH", "T4TOTAL", "FREET4", "T3FREE", "THYROIDAB" in the last 72 hours. Anemia Panel: No results for input(s): "VITAMINB12", "FOLATE", "FERRITIN", "TIBC", "IRON", "RETICCTPCT" in the last 72 hours.  Urine analysis:    Component Value Date/Time   COLORURINE YELLOW 12/03/2021 2212   APPEARANCEUR CLEAR 12/03/2021 2212   APPEARANCEUR Clear 09/20/2017 1337   LABSPEC 1.014 12/03/2021 2212   PHURINE 6.0 12/03/2021 2212   GLUCOSEU NEGATIVE 12/03/2021 2212   HGBUR NEGATIVE 12/03/2021 2212   BILIRUBINUR NEGATIVE 12/03/2021 2212   BILIRUBINUR Negative 09/20/2017 [place] 12/03/2021 2212   PROTEINUR NEGATIVE 12/03/2021 2212   UROBILINOGEN negative 12/31/2013 1022   UROBILINOGEN 0.2 04/18/2013 2026   NITRITE NEGATIVE 12/03/2021 2212   LEUKOCYTESUR NEGATIVE 12/03/2021 2212   Sepsis  Labs: '@LABRCNTIP'$ (procalcitonin:4,lacticidven:4)  ) Recent Results (from the past 240 hour(s))  MRSA Next Gen by PCR, Nasal     Status: None   Collection Time: 03/29/2022  5:42 PM   Specimen: Nasal Mucosa; Nasal Swab  Result Value Ref Range Status   MRSA by PCR Next Gen NOT DETECTED NOT DETECTED Final    Comment: (NOTE) The GeneXpert MRSA Assay (FDA approved for NASAL specimens only), is one component of a comprehensive MRSA colonization surveillance program. It is not intended to diagnose MRSA infection nor to guide or monitor treatment for MRSA infections. Test performance is not FDA approved in patients less than 8 years old. Performed at [facility], [place] [address] [place], [place] [address]   Resp panel by RT-PCR (RSV, Flu A&B, Covid) Anterior Nasal Swab     Status: None   Collection Time: 03/13/2022  8:06 PM   Specimen: Anterior Nasal Swab  Result Value Ref Range Status   SARS Coronavirus 2 by RT PCR NEGATIVE NEGATIVE Final    Comment: (NOTE) SARS-CoV-2 target nucleic acids are NOT DETECTED.  The SARS-CoV-2 RNA is generally detectable in upper respiratory specimens during the acute phase of infection. The lowest concentration of SARS-CoV-2 viral copies this assay can detect is 138 copies/mL. A negative result does not preclude SARS-Cov-2 infection and should not be used as the sole basis for treatment or other patient management decisions. A negative result may occur with  improper specimen collection/handling, submission of specimen other than nasopharyngeal swab, presence of viral mutation(s) within the areas targeted by this assay, and inadequate number of viral copies(<138 copies/mL). A negative result must  be combined with clinical observations, patient history, and epidemiological information. The expected result is Negative.  Fact Sheet for Patients:  EntrepreneurPulse.com.au  Fact Sheet for Healthcare Providers:   IncredibleEmployment.be  This test is no t yet approved or cleared by the [place] FDA and  has been authorized for detection and/or diagnosis of SARS-CoV-2 by FDA under an Emergency Use Authorization (EUA). This EUA will remain  in effect (meaning this test can be used) for the duration of the COVID-19 declaration under Section 564(b)(1) of the Act, 21 U.S.C.section 360bbb-3(b)(1), unless the authorization is terminated  or revoked sooner.       Influenza A by PCR NEGATIVE NEGATIVE Final   Influenza B by PCR NEGATIVE NEGATIVE Final    Comment: (NOTE) The Xpert Xpress SARS-CoV-2/FLU/RSV plus assay is intended as an aid in the diagnosis of influenza from Nasopharyngeal swab specimens and should not be used as a sole basis for treatment. Nasal washings and aspirates are unacceptable for Xpert Xpress SARS-CoV-2/FLU/RSV testing.  Fact Sheet for Patients: EntrepreneurPulse.com.au  Fact Sheet for Healthcare Providers: IncredibleEmployment.be  This test is not yet approved or cleared by the [place] FDA and has been authorized for detection and/or diagnosis of SARS-CoV-2 by FDA under an Emergency Use Authorization (EUA). This EUA will remain in effect (meaning this test can be used) for the duration of the COVID-19 declaration under Section 564(b)(1) of the Act, 21 U.S.C. section 360bbb-3(b)(1), unless the authorization is terminated or revoked.     Resp Syncytial Virus by PCR NEGATIVE NEGATIVE Final    Comment: (NOTE) Fact Sheet for Patients: EntrepreneurPulse.com.au  Fact Sheet for Healthcare Providers: IncredibleEmployment.be  This test is not yet approved or cleared by the [place] FDA and has been authorized for detection and/or diagnosis of SARS-CoV-2 by FDA under an Emergency Use Authorization (EUA). This EUA will remain in effect (meaning this test can be used) for  the duration of the COVID-19 declaration under Section 564(b)(1) of the Act, 21 U.S.C. section 360bbb-3(b)(1), unless the authorization is terminated or revoked.  Performed at [facility], [place] [address], [place], [place] [address]   Respiratory (~20 pathogens) panel by PCR     Status: None   Collection Time: 03/07/2022  8:06 PM   Specimen: Anterior Nasal Swab; Respiratory  Result Value Ref Range Status   Adenovirus NOT DETECTED NOT DETECTED Final   Coronavirus 229E NOT DETECTED NOT DETECTED Final    Comment: (NOTE) The Coronavirus on the Respiratory Panel, DOES NOT test for the novel  Coronavirus (2019 nCoV)    Coronavirus HKU1 NOT DETECTED NOT DETECTED Final   Coronavirus NL63 NOT DETECTED NOT DETECTED Final   Coronavirus OC43 NOT DETECTED NOT DETECTED Final   Metapneumovirus NOT DETECTED NOT DETECTED Final   Rhinovirus / Enterovirus NOT DETECTED NOT DETECTED Final   Influenza A NOT DETECTED NOT DETECTED Final   Influenza B NOT DETECTED NOT DETECTED Final   Parainfluenza Virus 1 NOT DETECTED NOT DETECTED Final   Parainfluenza Virus 2 NOT DETECTED NOT DETECTED Final   Parainfluenza Virus 3 NOT DETECTED NOT DETECTED Final   Parainfluenza Virus 4 NOT DETECTED NOT DETECTED Final   Respiratory Syncytial Virus NOT DETECTED NOT DETECTED Final   Bordetella pertussis NOT DETECTED NOT DETECTED Final   Bordetella Parapertussis NOT DETECTED NOT DETECTED Final   Chlamydophila pneumoniae NOT DETECTED NOT DETECTED Final   Mycoplasma pneumoniae NOT DETECTED NOT DETECTED Final    Comment: Performed at [facility], [place]  [address], [place], [place] [address]  Expectorated Sputum Assessment w Gram Stain, Rflx to Resp Cult     Status: None   Collection Time: 03/16/2022 11:14 PM   Specimen: Sputum  Result Value Ref Range Status   Specimen Description SPUTUM  Final   Special Requests NONE  Final   Sputum evaluation   Final    Sputum specimen not acceptable for testing.  Please  recollect.   Results Called to: Ashok Pall., RN AT 3734 ON 03/26/2022 BY LJM Performed at [facility], [place] [address], [place], [place] [address]    Report Status 03/20/2022 FINAL  Final  Expectorated Sputum Assessment w Gram Stain, Rflx to Resp Cult     Status: None   Collection Time: 03/13/22  5:25 AM  Result Value Ref Range Status   Specimen Description EXPECTORATED SPUTUM  Final   Special Requests NONE  Final   Sputum evaluation   Final    THIS SPECIMEN IS ACCEPTABLE FOR SPUTUM CULTURE Performed at [facility], [place] [address], [place], [place] [address]    Report Status 03/13/2022 FINAL  Final  Culture, Respiratory w Gram Stain     Status: None   Collection Time: 03/13/22  5:25 AM  Result Value Ref Range Status   Specimen Description   Final    EXPECTORATED SPUTUM Performed at [place] [address], [place], [place] [address]    Special Requests   Final    NONE Reflexed from [id number] Performed at [place] [address] [place], [place] [address]    Gram Stain   Final    MODERATE WBC PRESENT, PREDOMINANTLY PMN FEW GRAM NEGATIVE RODS RARE SQUAMOUS EPITHELIAL CELLS PRESENT Performed at [facility], [place] [address] [place],  [address]    Culture RARE CANDIDA ALBICANS  Final   Report Status 03/16/2022 FINAL  Final         Radiology Studies: No results found.      Scheduled Meds:  Chlorhexidine Gluconate Cloth  6 each Topical Daily   enoxaparin (LOVENOX) injection  40 mg Subcutaneous Q24H   famotidine  20 mg Oral Daily   feeding supplement  237 mL Oral BID BM   hydrocortisone cream   Topical TID   ipratropium-albuterol  3 mL Nebulization QID   lactose free nutrition  237 mL Oral Q24H   levothyroxine  75 mcg Oral Q0600   metoprolol tartrate  25 mg Oral BID   midodrine  2.5 mg Oral BID WC   mouth rinse  15 mL Mouth Rinse 4 times per day   predniSONE  40 mg Oral Q  breakfast   sucralfate  1 g Oral TID WC & HS   Continuous Infusions:  sodium chloride     amiodarone 30 mg/hr (03/19/22 0700)   ceFEPime (MAXIPIME) IV Stopped (03/19/22 0535)   metronidazole Stopped (03/18/22 2220)     LOS: 7 days   The patient is critically ill with multiple organ systems failure and requires high complexity decision making for assessment and support, frequent evaluation and titration of therapies, application of advanced monitoring technologies and extensive interpretation of multiple databases. Critical Care Time devoted to patient care services described in this note  Time spent: 40 minutes     Elvert Cumpton, Geraldo Docker, MD [facility]   If 7PM-7AM, please contact night-coverage 03/19/2022, 10:01 AM

## 2022-03-19 NOTE — Progress Notes (Signed)
  Daily Progress Note   Patient Name: [name]       Date: 03/19/2022 DOB: 1932-07-19  Age: 89 y.o. MRN#: [id number] Attending Physician: Allie Bossier, MD Primary Care Physician: Dettinger, [name], MD Admit Date: 03/04/2022 Length of Stay: 7 days  Will place full consult note as soon as able. Patient transitioning to comfort focused care at this time. Will adjust orders accordingly. Patient's son, Mitzi Hansen, will be here at [place]. Will maintain HFNC support until visit and then can remove.   Chelsea Aus, DO Palliative Care Provider PMT # [phone]

## 2022-03-19 NOTE — Consult Note (Signed)
Consultation Note Date: 03/19/2022   Patient Name: [name]  DOB: 01/10/1933  MRN: [id number]  Age / Sex: 89 y.o., male   PCP: Dettinger, [name], MD Referring Physician: Allie Bossier, MD  Reason for Consultation: Establishing goals of care     Chief Complaint/History of Present Illness:   Patient is an 89 year old male with a past medical history of ascending aortic aneurysm, atrial fibrillation, CAD, GERD, COPD, bronchiectasis (never smoker) with chronic hypoxic respiratory failure on nasal cannula support at home, and recent RSV bronchitis who was admitted on 03/09/2022 for management of blood-tinged sputum after recently being at [facility] for rehab.  During hospitalization patient has required management in ICU for hypotension requiring pressor support and acute on chronic hypoxic respiratory failure requiring high flow nasal cannula support in setting of multilobar PNA.  Patient now expressing wishes that this is not quality of life to him and he does not want to continue in this manner.  Palliative medicine team consulted to assist with complex medical decision making.  Extensive review of EMR prior to seeing patient.  Discussed care with bedside RN prior to seeing patient as well.  ------------------------------------------------------------------------------------------------------------- Advance Care Planning Conversation  Pertinent diagnosis: aortic aneurysm, atrial fibrillation, CAD, GERD, COPD, bronchiectasis (never smoker) with chronic hypoxic respiratory failure on nasal cannula support at home, recent RSV bronchitis, multilobar PNA  The patient and/or family consented to a voluntary [place]. Individuals present for the conversation: Patient, this palliative provider, spoke with son-in-law (daughter's husband on phone)  Summary of the conversation:  Presented to bedside and introduced myself and the role of the palliative medicine team to  patient.  Patient laying in bed on high flow nasal cannula with increased work of breathing noted.  No family present at bedside.  After introductions, patient able to update this provider regarding his medical journey.  Patient described that he does not want to go through any further aggressive medical interventions.  He can easily discussed that this is not quality of life to him and he would want to be allowed to have a natural death.  Patient noted that he has excepted that he is at the end of his life and is ready to pass on to see his wife who passed away a few years ago.  They had been together for over 50 years.  Acknowledged his wishes for care. Inquired if he had discussed this further with family.  With patient's permission attempted to call daughter while present at bedside.  Able to reach patient's son-in-law, daughter's husband, and introduced myself.  He noted that daughter was currently on the phone with patient's hospitalist Dr. Sherral Hammers.  Son-in-law stated that they know patient is at the end of his life.  They are both retired paramedics and patient has been "saying his goodbyes" including leaving wishes for the grandchildren yesterday.  Family has heard patient is going to pass away and they agree that he would want his comfort focused on.  Family coming to bedside today to visit, in particular patient's son Mitzi Hansen who should be here around 1 PM.  Acknowledged this and that palliative medicine team would continue to follow along to support patient's wishes.  After visit able to discuss care with hospitalist and bedside RN again.  Dr. Sherral Hammers noted daughter agreed that patient would want to focus on his comfort and he has been informing family of this.  Noted would place orders at this time to focus on comfort.  Presented  back to bedside shortly after discussions to update patient.  Let him know would be starting continuous medication to help with his work of breathing.  Also discussed that  hopefully he can become comfortable and we can remove the nasal cannula.  Patient very much agreed with this as the nasal cannula is uncomfortable and causes his nose and mouth to be dry.  Let patient know that if he has any concerns about medications such as them not helping enough, please let us know so we can further adjust medications.  Patient agreeing with this plan.  Outcome of the conversations and/or documents completed:  Transition to comfort focused care at this time.   I spent 29 minutes providing separately identifiable ACP services with the patient and/or surrogate decision maker in a voluntary, in-person conversation discussing the patient's wishes and goals as detailed in the above note.  Chelsea Aus, DO Palliative Care Provider  -------------------------------------------------------------------------------------------------------------  Primary Diagnoses  Present on Admission:  Acute on chronic respiratory failure with hypoxia (HCC)  Lobar pneumonia, unspecified organism (HCC)  Malnutrition of moderate degree  Acute on chronic diastolic CHF (congestive heart failure) (HCC)  Hypotension, unspecified  Paroxysmal atrial fibrillation (HCC)  CAD in native artery  Anemia of chronic disease  Palliative Review of Systems: Shortness of breath, dry mouth and nose  Past Medical History:  Diagnosis Date   Abnormality of gait 06/07/2013   Anemia    Aneurysm ([place])    on assending aorta, currently watching it, Dr Cyndia Bent   Anxiety    Arthritis    Asthma    Atrial fibrillation ([place])    B12 deficiency    Barrett's esophagus    CAD (coronary artery disease) 03/2006   3.0 x 20 mm TAXUS [place] DES to the LAD; 01/2007  L main 30%, oLAD 50%, pLAD stent ok, CFX 80%, OM 60%, pRCA 60%, mRCA 70%, oPDA 90%; med rx    Cancer ([place])    skin cancer    Cataract    bilateral removal of cateracts   COLONIC POLYPS 07/17/2008   Qualifier: Diagnosis of  By: Lovette Cliche, CNA, Christy      Complication of anesthesia     no issues,but pt prefers spinal due to Pulmonary problems   COPD (chronic obstructive pulmonary disease) ([place])    oxygen  on standby in home.   Depression    Diverticulosis    Emphysema    Enlarged prostate with urinary retention    Esophageal stenosis    Gastric ulcer    GERD (gastroesophageal reflux disease)    Hiatal hernia    Hypothyroidism    Neuropathy    Osteoporosis    Pituitary macroadenoma (HCC)    Restless legs syndrome (RLS)    Tubular adenoma of colon    UGIB (upper gastrointestinal bleed) 03/2013   EGD w/ large ulcer at GE junction   Social History   Socioeconomic History   Marital status: Widowed    Spouse name: Not on file   Number of children: 2   Years of education: 14   Highest education level: Associate degree: occupational, Hotel manager, or vocational program  Occupational History   Occupation: retired  Tobacco Use   Smoking status: Never   Smokeless tobacco: Never  Vaping Use   Vaping Use: Never used  Substance and Sexual Activity   Alcohol use: No   Drug use: No   Sexual activity: Not Currently    Birth control/protection: None  Other Topics Concern   Not on  file  Social History Narrative   Patient drinks caffeine a few times a week.   Patient is right handed.   Social Determinants of Health   Financial Resource Strain: Low Risk  (02/08/2021)   Overall Financial Resource Strain (CARDIA)    Difficulty of Paying Living Expenses: Not hard at all  Food Insecurity: No Food Insecurity (12/02/2021)   Hunger Vital Sign    Worried About Running Out of Food in the Last Year: Never true    Ran Out of Food in the Last Year: Never true  Transportation Needs: No Transportation Needs (12/02/2021)   PRAPARE - Hydrologist (Medical): No    Lack of Transportation (Non-Medical): No  Physical Activity: Insufficiently Active (02/08/2021)   Exercise Vital Sign    Days of Exercise per Week: 7 days     Minutes of Exercise per Session: 20 min  Stress: No Stress Concern Present (02/08/2021)   [place]    Feeling of Stress : Not at all  Social Connections: Moderately Integrated (02/08/2021)   Social Connection and Isolation Panel [NHANES]    Frequency of Communication with Friends and Family: More than three times a week    Frequency of Social Gatherings with Friends and Family: More than three times a week    Attends Religious Services: More than 4 times per year    Active Member of [employer] or Organizations: Yes    Attends Archivist Meetings: More than 4 times per year    Marital Status: Widowed   Family History  Problem Relation Age of Onset   Heart disease Sister    Heart failure Father    Other Mother        phebitis related to Tyjay's birth, he was 40 weeks old when she died   Colon cancer Neg Hx    Esophageal cancer Neg Hx    Rectal cancer Neg Hx    Stomach cancer Neg Hx    Colon polyps Neg Hx    Scheduled Meds:  Chlorhexidine Gluconate Cloth  6 each Topical Daily   enoxaparin (LOVENOX) injection  40 mg Subcutaneous Q24H   famotidine  20 mg Oral Daily   feeding supplement  237 mL Oral BID BM   hydrocortisone cream   Topical TID   ipratropium-albuterol  3 mL Nebulization QID   lactose free nutrition  237 mL Oral Q24H   levothyroxine  75 mcg Oral Q0600   metoprolol tartrate  25 mg Oral BID   midodrine  2.5 mg Oral BID WC   mouth rinse  15 mL Mouth Rinse 4 times per day   predniSONE  40 mg Oral Q breakfast   sucralfate  1 g Oral TID WC & HS   Continuous Infusions:  sodium chloride     amiodarone 30 mg/hr (03/19/22 0700)   ceFEPime (MAXIPIME) IV Stopped (03/19/22 0535)   metronidazole Stopped (03/18/22 2220)   PRN Meds:.acetaminophen, benzonatate, diphenhydrAMINE-zinc acetate, guaiFENesin-dextromethorphan, ipratropium, levalbuterol, metoprolol tartrate, ondansetron (ZOFRAN) IV, mouth rinse, sodium  chloride Allergies  Allergen Reactions   Gadavist [Gadobutrol] Hives   Prednisone Other (See Comments)    Agitation and hallucinations   Betadine [Povidone Iodine] Other (See Comments)    Blisters    Lortab [Hydrocodone-Acetaminophen] Nausea And Vomiting   Penicillins Other (See Comments)    Lightheadedness  Has patient had a PCN reaction causing immediate rash, facial/tongue/throat swelling, SOB or lightheadedness with hypotension: Yes Has patient  had a PCN reaction causing severe rash involving mucus membranes or skin necrosis: No Has patient had a PCN reaction that required hospitalization No Has patient had a PCN reaction occurring within the last 10 years: No If all of the above answers are "NO", then may proceed with Cephalosporin use.   Zetia [Ezetimibe] Other (See Comments)    Muscle weakness    Zocor [Simvastatin] Nausea Only and Other (See Comments)    Muscle weakness    CBC:    Component Value Date/Time   WBC 17.9 (H) 03/19/2022 0248   HGB 9.7 (L) 03/19/2022 0248   HGB 10.3 (L) 01/28/2022 1128   HCT 32.6 (L) 03/19/2022 0248   HCT 31.9 (L) 01/28/2022 1128   PLT 313 03/19/2022 0248   PLT 313 01/28/2022 1128   MCV 93.1 03/19/2022 0248   MCV 90 01/28/2022 1128   NEUTROABS 14.1 (H) 03/19/2022 0248   NEUTROABS 7.0 01/28/2022 1128   LYMPHSABS 0.8 03/19/2022 0248   LYMPHSABS 1.2 01/28/2022 1128   MONOABS 1.6 (H) 03/19/2022 0248   EOSABS 0.0 03/19/2022 0248   EOSABS 0.4 01/28/2022 1128   BASOSABS 0.0 03/19/2022 0248   BASOSABS 0.2 01/28/2022 1128   Comprehensive Metabolic Panel:    Component Value Date/Time   NA 136 03/19/2022 0248   NA 140 12/17/2021 1618   K 4.0 03/19/2022 0248   CL 109 03/19/2022 0248   CO2 21 (L) 03/19/2022 0248   BUN 29 (H) 03/19/2022 0248   BUN 19 12/17/2021 1618   CREATININE 0.90 03/19/2022 0248   GLUCOSE 98 03/19/2022 0248   CALCIUM 8.4 (L) 03/19/2022 0248   AST 32 03/19/2022 0248   ALT 32 03/19/2022 0248   ALKPHOS 46 03/19/2022  0248   BILITOT 0.5 03/19/2022 0248   BILITOT 0.7 12/17/2021 1618   PROT 6.0 (L) 03/19/2022 0248   PROT 6.2 12/17/2021 1618   ALBUMIN 2.9 (L) 03/19/2022 0248   ALBUMIN 3.9 12/17/2021 1618    Physical Exam: Vital Signs: BP 112/69   Pulse 71   Temp 97.7 F (36.5 C) (Axillary)   Resp (!) 24   Ht '5\' 6"'$  (1.676 m)   Wt 73.3 kg   SpO2 96%   BMI 26.08 kg/m  SpO2: SpO2: 96 % O2 Device: O2 Device: High Flow Nasal Cannula O2 Flow Rate: O2 Flow Rate (L/min): (S) 10 L/min (down to 8 lpm- md goal >90% awake) Intake/output summary:  Intake/Output Summary (Last 24 hours) at 03/19/2022 1024 Last data filed at 03/19/2022 0700 Gross per 24 hour  Intake 1203.71 ml  Output 650 ml  Net 553.71 ml   LBM: Last BM Date : 03/18/22 Baseline Weight: Weight: 70.3 kg Most recent weight: Weight: 73.3 kg  General: laying in bed, awake, alert, pleasant,  Eyes: no drainage notes HENT: dry mucous membranes Cardiovascular: RRR Respiratory: increased work of breathing noted with accessory muscle use, on HFNC Abdomen: not distended Extremities: moving with purpose Skin: no rashes or lesions on visible skin Neuro: A&Ox4, following commands easily Psych: appropriately answers all questions          Palliative Performance Scale: 20%               Additional Data Reviewed: Recent Labs    03/18/22 0238 03/19/22 0248  WBC 17.2* 17.9*  HGB 9.8* 9.7*  PLT 316 313  NA 135 136  BUN 27* 29*  CREATININE 0.92 0.90    Imaging: DG CHEST PORT 1 VIEW CLINICAL DATA:  Acute respiratory failure with  hypoxia.  EXAM: PORTABLE CHEST 1 VIEW  COMPARISON:  Radiographs 03/14/2022 and 03/22/2022.  CT 03/16/2022.  FINDINGS: 0446 hours. The heart size and mediastinal contours appear stable with mild cardiomegaly and aortic atherosclerosis. There is central prominence of the pulmonary arteries. Bilateral airspace opacities and small pleural effusions appear unchanged. No evidence of pneumothorax. No acute  osseous findings are evident. Telemetry leads overlie the chest.  IMPRESSION: No significant change in bilateral airspace opacities and small pleural effusions.  Electronically Signed   By: Richardean Sale M.D.   On: 03/17/2022 08:21    I personally reviewed recent imaging.   Palliative Care Assessment and Plan Summary of Established Goals of Care and Medical Treatment Preferences   Patient is an 89 year old male with a past medical history of ascending aortic aneurysm, atrial fibrillation, CAD, GERD, COPD, bronchiectasis (never smoker) with chronic hypoxic respiratory failure on nasal cannula support at home, and recent RSV bronchitis who was admitted on 03/05/2022 for management of blood-tinged sputum after recently being at [facility] for rehab.  During hospitalization patient has required management in ICU for hypotension requiring pressor support and acute on chronic hypoxic respiratory failure requiring high flow nasal cannula support in setting of multilobar PNA.  Patient now expressing wishes that this is not quality of life to him and he does not want to continue in this manner.  Palliative medicine team consulted to assist with complex medical decision making.  # Complex medical decision making/goals of care  -Discussed care with patient as described above in detail in HPI.  Patient has capacity to make his own medical decisions and is easily able to discuss his options for medical care and the risk/benefits/consequences of those decisions.  Patient has accepted that he is at the end of his life and would want to focus on comfort.  Patient would not want to continue in this manner because this is not quality of life to him.  Also discussed this with patient's son-in-law as daughter was on phone with hospitalist.  All in agreements that this would be what patient wants and he has expressed this wishes to family.  Will transition to comfort focused care at this time.  Will discontinue  interventions that are no longer focused on comfort such as imaging, lab work, and IV fluids.  Will instead provide medications for pain, dyspnea, and agitation.  -Patient's family will be coming to visit including son Mitzi Hansen who should be here around 1 PM.  -  Code Status: DNR  Prognosis: hours-days  # Symptom management   -Pain/Dyspnea, acute in the setting of end-of-life care                               -Started IV morphine '2mg'$ /hr continuous basal infusion with bolus dose of '3mg'$  q70mns prn for breakthrough management.  Continue to adjust based on patient's symptom burden as would expect respiratory burden to continue worsening.                   -Anxiety/agitation, in the setting of end-of-life care                               -Started IV Ativan 1 mg every 4 hours as needed. Continue to adjust based on patient's symptom burden.                                 -  And also has IV Haldol 0.5 mg every 4 hours as needed. Continue to adjust based on patient's symptom burden.                   -Secretions, in the setting of end-of-life care                               -Started IV glycopyrrolate 0.2 mg every 4 hours as needed.  # Psycho-social/Spiritual Support:  - Support System: daughter, son-in-law, sons  # Discharge Planning:  Patient transitioning to comfort focused care at this time. Will monitor for the time to determine if patient may be stable for transfer once receiving comfort care. If stable, will consider TOC referral for inpatient hospice placement.   Thank you for allowing the palliative care team to participate in the care [name].  Chelsea Aus, DO Palliative Care Provider PMT # [phone]  If patient remains symptomatic despite maximum doses, please call PMT at [phone] between 0700 and 1900. Outside of these hours, please call attending, as PMT does not have night coverage.

## 2022-03-19 NOTE — Progress Notes (Addendum)
NAME:  [name], MRN:  [id number], DOB:  06/18/1932, LOS: 7 ADMISSION DATE:  03/11/2022, CONSULTATION DATE:  03/19/22 REFERRING MD:  Karleen Hampshire, CHIEF COMPLAINT:  Hypotension   History of Present Illness:   89 y/o M with PMH significant for bronchiectasis-never smoker and followed by Dr. Loanne Drilling on 2-5L home O2, AAA, atrial fibrillation, CAD, Barrett's esophagus, pituitary macroadenoma with recent RSV bronchitis admission and discharged to SNF on 02/10/22 who presented to the ED on 1/13 with several weeks of blood tinged sputum.    He was seen in the pulmonary office four days prior to admission with similar symptoms and prescribed Levaquin and Prednisone.   CXR with diffuse bilateral ground glass opacities and LUL infiltrate consistent with multifocal PNA.  He was admitted and treated with vancomycin and cefepime. COVID, flu and RSV and RVP all negative.  His BNP was elevated so was started on Lasix '20mg'$  bid and received metoprolol this morning.  Later in the day his blood pressure down-trended and PCCM consulted.  MAP 55-60, states that his head feels "fuzzy" and he is tired, otherwise is feeling improved.     Pertinent  Medical History   has a past medical history of Abnormality of gait (06/07/2013), Anemia, Aneurysm ([place]), Anxiety, Arthritis, Asthma, Atrial fibrillation ([place]), B12 deficiency, Barrett's esophagus, CAD (coronary artery disease) (03/2006), Cancer ([place]), Cataract, COLONIC POLYPS (78/29/5621), Complication of anesthesia, COPD (chronic obstructive pulmonary disease) ([place]), Depression, Diverticulosis, Emphysema, Enlarged prostate with urinary retention, Esophageal stenosis, Gastric ulcer, GERD (gastroesophageal reflux disease), Hiatal hernia, Hypothyroidism, Neuropathy, Osteoporosis, Pituitary macroadenoma (HCC), Restless legs syndrome (RLS), Tubular adenoma of colon, and UGIB (upper gastrointestinal bleed) (03/2013).   Significant Hospital Events: Including procedures, antibiotic  start and stop dates in addition to other pertinent events   1/13 admit, started on vanc/cefepime 1/15 PCCM consult, added azithryomycin 1/16 coughing/possible aspiration event overnight, on HFNC and peripheral levophed, O2 increased from 10L to 35L HFNC, coughing after eating, on 57mg levophed  1/18 - Off vasopressors, started on Amio gtt  1/19- weaned HHFNC 20L/40Fio2  Interim History / Subjective:   No acute events weaned to 10 L high flow nasal cannula.  Continues on amiodarone drip Feeling a little more short of breath today with cough  Objective   Blood pressure 112/69, pulse 71, temperature 97.9 F (36.6 C), temperature source Oral, resp. rate (!) 24, height '5\' 6"'$  (1.676 m), weight 73.3 kg, SpO2 96 %.        Intake/Output Summary (Last 24 hours) at 03/19/2022 0801 Last data filed at 03/19/2022 0700 Gross per 24 hour  Intake 1203.71 ml  Output 650 ml  Net 553.71 ml   Filed Weights   03/14/22 0500 03/18/22 0500 03/19/22 0500  Weight: 67 kg 71.6 kg 73.3 kg   Physical Examination: Gen:      No acute distress, chronically ill-appearing, mild distress HEENT:  EOMI, sclera anicteric Neck:     No masses; no thyromegaly Lungs:    Clear to auscultation bilaterally; normal respiratory effort CV:         Regular rate and rhythm; no murmurs Abd:      + bowel sounds; soft, non-tender; no palpable masses, no distension Ext:    No edema; adequate peripheral perfusion Skin:      Warm and dry; no rash Neuro: alert and oriented x 3 Psych: normal mood and affect   Resolved Hospital Problem list   Hypotension   Assessment & Plan:   Hypotension > resolved - normotensive  MAP  55-60, on review of previous admissions it appears his BP is usually 100/60's Possibly from beta blocker and diuresis. Received IVF and albumin 1/15 - Midodrine 2.5 TID   Acute on Chronic Hypoxic Respiratory Failure likely secondary to bronchiectasis exacerbation and multi-focal pneumonia Concern for  aspiration Wean down oxygen as tolerated Follow tracheal aspirate cultures Continue flutter valve, incentive, bronchodilators, hypertonic saline Chest PT Antibiotics for 7 days Aspiration precautions Can start slow taper of prednisone by 10 mg every 3 days.  Afib Plan  On amnio drip  Goals of care He tells me that he wants to take his last breath and meet his deceased wife.  He is alert and looks competent to make his own decisions Will relay message to primary team for goals of care discussion and place a palliative care consult.  Best Practice (right click and "Reselect all SmartList Selections" daily)  Per primary DNR/DNI   Critical care time:  NA   Marshell Garfinkel MD [facility] See Amion for pager  If no response to pager , please call [phone] until 7pm After 7:00 pm call Elink  [phone] 03/19/2022, 8:07 AM

## 2022-03-19 NOTE — Progress Notes (Signed)
PT did have productive cough post CPT (as RT leaving room)- small, tan, thick mucus).

## 2022-03-20 DIAGNOSIS — R0602 Shortness of breath: Secondary | ICD-10-CM | POA: Diagnosis not present

## 2022-03-20 DIAGNOSIS — J471 Bronchiectasis with (acute) exacerbation: Secondary | ICD-10-CM | POA: Diagnosis not present

## 2022-03-20 DIAGNOSIS — Z515 Encounter for palliative care: Secondary | ICD-10-CM | POA: Diagnosis not present

## 2022-03-20 DIAGNOSIS — Z7189 Other specified counseling: Secondary | ICD-10-CM

## 2022-03-20 DIAGNOSIS — J9621 Acute and chronic respiratory failure with hypoxia: Secondary | ICD-10-CM | POA: Diagnosis not present

## 2022-03-20 MED ORDER — MORPHINE BOLUS VIA INFUSION
4.0000 mg | INTRAVENOUS | Status: DC | PRN
  Administered 2022-03-20 – 2022-03-21 (×9): 4 mg via INTRAVENOUS

## 2022-03-20 NOTE — Plan of Care (Signed)
  Problem: Education: Goal: Knowledge of General Education information will improve Description: Including pain rating scale, medication(s)/side effects and non-pharmacologic comfort measures Outcome: Progressing   Problem: Activity: Goal: Risk for activity intolerance will decrease Outcome: Progressing   Problem: Pain Managment: Goal: General experience of comfort will improve Outcome: Progressing

## 2022-03-20 NOTE — Progress Notes (Signed)
Called patient's son and daughter to let them know that patient was moved from room 1223 to room 1329. Unable to speak to someone; therefore, left message on son, [place] phone. Daughter, Donna's, voicemail was full; therefore, unable to leave her a message.

## 2022-03-20 NOTE — Progress Notes (Signed)
PROGRESS NOTE    [name]  GNF:[id number] DOB: [birth year] DOA: 03/20/2022 PCP: Dettinger, [name], MD   Brief Narrative:  [name] is a 89 y.o. WM PMHx Ascending aorta aneurysm, atrial fibrillation, CAD, GERD, COPD, depression,   Presents to ED for sob , blood tinged  productive sputum since few weeks. Patient is a poor historian and no family at bedside. He was recently admitted for RSV bronchitis, and discharged to ALF. He is chronically on  4 lit of [place] oxygen. H presents this admission for sob and an episode of hemoptysis and worsening pedal edema. He was admitted for acute on chronic respiratory failure with hypoxia sec to combination of pneumonia and CHF.  Hospital course complicated by aspiration and vomiting, requiring heated high flow oxygen. He was transferred to stepdown for closer monitoring. PCCM consulted for recommendations. Advanced directives in place with DNR/DNI. Waiting for clinical improvement.      Subjective: 1/21 afebrile overnight, A/O x 4 states he is comfortable has no needs.  Family at bedside agree.  comfort care.      Assessment & Plan:  Covid vaccination;  Principal Problem:   Acute on chronic respiratory failure with hypoxia (HCC) Active Problems:   Paroxysmal atrial fibrillation (HCC)   Bronchiectasis (HCC)   Lobar pneumonia, unspecified organism (HCC)   Malnutrition of moderate degree   Acute on chronic diastolic CHF (congestive heart failure) (HCC)   Hypotension, unspecified   CAD in native artery   Anemia of chronic disease   Bronchiectasis with (acute) exacerbation (HCC)   DNR (do not resuscitate)   Pain   High risk medication use   End of life care   Goals of care, counseling/discussion   Palliative care encounter  Acute on chronic respiratory failure with hypoxia -Multifactorial see below.  Multifocal pneumonia -Complete course 7-day course of antibiotics - Flutter valve - Incentive spirometry - DuoNeb QID -Xopenex  PRN -Solu-Medrol 80 mg daily---> PCCM changed to Prednisone 40 mg daily - Hypertonic saline nebs BID -Chest Physiotherapy via bed QID -Sputum pending - Viral cultures negative to date -1/18  PCXR; shows slight improvement RML, LEFT costovertebral angle however overall no significant improvement.  -1/20 this a.m. patient informed me he wished to change his care to comfort care  Bronchiectasis -1/15 CXR Persistent interstitial and airspace opacities in the lungs bilaterally  Leukocytosis - 1/18 afebrile, negative left shift, negative bands.  Most likely secondary to steroids    Acute on Chronic Diastolic CHF  -BNP elevated, pedal edema present on admission -Lasix IV 20 mg BID (hold) increased creatinine/soft BP.  -Strict in and out +2.4 L - Daily weight Filed Weights   03/14/22 0500 03/18/22 0500 03/19/22 0500  Weight: 67 kg 71.6 kg 73.3 kg  -Last echocardiogram 02/07/2022 preserved EF, diastolic dysfunction cannot be assessed -1/20 this a.m. patient informed me he wished to change his care to comfort care   Hypotension:  - 1/18 Midodrine 2.5 mg BID -1/19 Albumin 50 g x 1   Paroxysmal atrial fibrillation with RVR -Currently not rate controlled -Not on anticoagulation due to GI bleed - 1/18 EKG see/W A-fib with RVR -1/18 Toprol 50 mg daily (home dose), ADDENDUM unable to tolerate large pills therefore cannot swallow Toprol. - 1/18 Amiodarone drip -1/19 HR still not optimal however BP borderline.  After patient receives albumin may consider increasing beta-blocker, or giving patient low-dose digoxin -1/19 Metoprolol 25 mg BID -1/20 this a.m. patient informed me he wished to change his care  to comfort care  History of coronary artery disease -Troponins negative -Negative chest pain    Hypothyroidism -Synthroid 75 mcg daily     GERD Continue with PPI     Hypokalemia -Potassium goal> 4.   Hypocalcemia - calcium goal> 8.9 - 1/19 corrected calcium = 8.8  - 1/19  calcium gluconate 2 g     Anemia of chronic disease -Hemoglobin 9-11   -Anemia panel showing low iron .  -Completion of antibiotics iron + vitamin C supplements .  Lab Results  Component Value Date   HGB 9.7 (L) 03/19/2022   HGB 9.8 (L) 03/18/2022   HGB 10.5 (L) 03/17/2022   HGB 10.3 (L) 03/16/2022   HGB 9.3 (L) 03/15/2022  -Stable transfuse for hemoglobin <7   Goals of care - 1/20 per patient's request spoke at length with Clarisse Gouge (daughter) to inform her of his decision to change his care plan to comfort care.  Answered all questions.  Informed her that Dr. Vinetta Bergamo (palliative care) would be in touch with patient and family to discuss comfort care options. -Dr. Vinetta Bergamo (palliative care) is awaiting arrival of son and daughter and once patient off of all medications and they have said their goodbyes will decide on residential hospice vs home hospice.  Will await her recommendation. -1/20 this a.m. patient informed me he wished to change his care to comfort care -1/21 discussed case with Dr. Chelsea Aus palliative care and she has put in request for inpatient hospice.  Will await to hear from [facility].   Mobility Assessment (last 72 hours)     Mobility Assessment     Row Name 03/19/22 2202 03/19/22 0816         Does patient have an order for bedrest or is patient medically unstable Yes- Bedfast (Level 1) - Complete No - Continue assessment      What is the highest level of mobility based on the progressive mobility assessment? Level 4 (Walks with assist in room) - Balance while marching in place and cannot step forward and back - Complete Level 4 (Walks with assist in room) - Balance while marching in place and cannot step forward and back - Complete               DVT prophylaxis: Lovenox Code Status: DNR Family Communication:  Status is: Inpatient    Dispo: The patient is from: Home              Anticipated d/c is to: SNF              Anticipated d/c date is: > 3 days               Patient currently is not medically stable to d/c.      Consultants:  PCCM   Procedures/Significant Events:  1/18  PCXR; shows slight improvement RML, LEFT costovertebral angle however overall no significant improvement.   I have personally reviewed and interpreted all radiology studies and my findings are as above.  VENTILATOR SETTINGS: HFNC 1/20 Flow 4 L/min SpO2 97%    Cultures COVID PCR, influenza PCR and RVP, strep pneumonia have been negative. Legionella antigen is negative 1/13 influenza A/B negative 1/13 RSV negative 1/13 respiratory virus panel negative 1/13 SARS coronavirus negative 1/14 sputum positive few GNR   Antimicrobials: Anti-infectives (From admission, onward)    Start     Ordered Stop   03/16/22 1000  azithromycin (ZITHROMAX) 500 mg in sodium chloride 0.9 % 250 mL IVPB  03/15/22 1307 03/18/22 0959   03/15/22 1200  azithromycin (ZITHROMAX) 500 mg in sodium chloride 0.9 % 250 mL IVPB  Status:  Discontinued        03/15/22 0944 03/15/22 1307   03/15/22 1000  azithromycin (ZITHROMAX) tablet 500 mg  Status:  Discontinued        03/14/22 1142 03/15/22 0944   03/15/22 1000  metroNIDAZOLE (FLAGYL) IVPB 500 mg        03/15/22 0906 03/20/22 0959   03/13/22 1130  azithromycin (ZITHROMAX) 500 mg in sodium chloride 0.9 % 250 mL IVPB  Status:  Discontinued        03/13/22 1126 03/14/22 1142   03/08/2022 1830  vancomycin (VANCOCIN) IVPB 1000 mg/200 mL premix  Status:  Discontinued        03/01/2022 1740 03/14/22 1147   03/29/2022 1800  ceFEPIme (MAXIPIME) 2 g in sodium chloride 0.9 % 100 mL IVPB        03/19/2022 1740           Devices    LINES / TUBES:      Continuous Infusions:  morphine 3 mg/hr (03/20/22 1107)     Objective: Vitals:   03/19/22 2000 03/19/22 2100 03/19/22 2200 03/20/22 0531  BP:   122/80 118/75  Pulse: (!) 107  (!) 111 95  Resp: '15 20 20 18  '$ Temp:   (!) 97.4 F (36.3 C) 98.3 F (36.8 C)  TempSrc:   Oral    SpO2: 92%  92% (!) 89%  Weight:      Height:        Intake/Output Summary (Last 24 hours) at 03/20/2022 1159 Last data filed at 03/20/2022 1000 Gross per 24 hour  Intake 36.94 ml  Output 250 ml  Net -213.06 ml    Filed Weights   03/14/22 0500 03/18/22 0500 03/19/22 0500  Weight: 67 kg 71.6 kg 73.3 kg   Physical Exam:  COMFORT CARE.  Patient appears comfortable.  No charge    Data Reviewed: Care during the described time interval was provided by me .  I have reviewed this patient's available data, including medical history, events of note, physical examination, and all test results as part of my evaluation.   CBC: Recent Labs  Lab 03/15/22 0249 03/16/22 0245 03/17/22 0232 03/18/22 0238 03/19/22 0248  WBC 11.7* 9.0 17.8* 17.2* 17.9*  NEUTROABS 7.7 7.5 14.8* 14.3* 14.1*  HGB 9.3* 10.3* 10.5* 9.8* 9.7*  HCT 30.4* 32.9* 33.5* 31.1* 32.6*  MCV 91.3 89.6 90.5 90.4 93.1  PLT 307 326 349 316 283    Basic Metabolic Panel: Recent Labs  Lab 03/15/22 0249 03/16/22 0245 03/17/22 0232 03/18/22 0238 03/19/22 0248  NA 136 133* 137 135 136  K 3.4* 4.0 4.0 3.6 4.0  CL 106 106 108 110 109  CO2 24 20* 20* 21* 21*  GLUCOSE 93 135* 122* 116* 98  BUN 22 22 26* 27* 29*  CREATININE 1.11 0.99 1.06 0.92 0.90  CALCIUM 7.6* 7.7* 8.4* 7.6* 8.4*  MG 2.2 2.2 2.5* 2.5* 2.3  PHOS 3.0 3.4 3.1 3.1 2.4*    GFR: Estimated Creatinine Clearance: 52.2 mL/min (by C-G formula based on SCr of 0.9 mg/dL). Liver Function Tests: Recent Labs  Lab 03/15/22 0249 03/16/22 0245 03/17/22 0232 03/18/22 0238 03/19/22 0248  AST '30 30 24 24 '$ 32  ALT 33 33 31 27 32  ALKPHOS 42 52 50 44 46  BILITOT 0.8 0.7 0.7 0.4 0.5  PROT 5.2* 6.3* 6.2* 5.8* 6.0*  ALBUMIN 2.2* 2.5* 2.6* 2.4* 2.9*    No results for input(s): "LIPASE", "AMYLASE" in the last 168 hours. No results for input(s): "AMMONIA" in the last 168 hours. Coagulation Profile: No results for input(s): "INR", "PROTIME" in the last 168  hours. Cardiac Enzymes: No results for input(s): "CKTOTAL", "CKMB", "CKMBINDEX", "TROPONINI" in the last 168 hours. BNP (last 3 results) Recent Labs    03/10/22 0956  PROBNP 679.0*    HbA1C: No results for input(s): "HGBA1C" in the last 72 hours. CBG: No results for input(s): "GLUCAP" in the last 168 hours. Lipid Profile: No results for input(s): "CHOL", "HDL", "LDLCALC", "TRIG", "CHOLHDL", "LDLDIRECT" in the last 72 hours. Thyroid Function Tests: No results for input(s): "TSH", "T4TOTAL", "FREET4", "T3FREE", "THYROIDAB" in the last 72 hours. Anemia Panel: No results for input(s): "VITAMINB12", "FOLATE", "FERRITIN", "TIBC", "IRON", "RETICCTPCT" in the last 72 hours.  Urine analysis:    Component Value Date/Time   COLORURINE YELLOW 12/03/2021 2212   APPEARANCEUR CLEAR 12/03/2021 2212   APPEARANCEUR Clear 09/20/2017 1337   LABSPEC 1.014 12/03/2021 2212   PHURINE 6.0 12/03/2021 2212   GLUCOSEU NEGATIVE 12/03/2021 2212   HGBUR NEGATIVE 12/03/2021 2212   BILIRUBINUR NEGATIVE 12/03/2021 2212   BILIRUBINUR Negative 09/20/2017 [place] 12/03/2021 2212   PROTEINUR NEGATIVE 12/03/2021 2212   UROBILINOGEN negative 12/31/2013 1022   UROBILINOGEN 0.2 04/18/2013 2026   NITRITE NEGATIVE 12/03/2021 2212   LEUKOCYTESUR NEGATIVE 12/03/2021 2212   Sepsis Labs: '@LABRCNTIP'$ (procalcitonin:4,lacticidven:4)  ) Recent Results (from the past 240 hour(s))  MRSA Next Gen by PCR, Nasal     Status: None   Collection Time: 03/26/2022  5:42 PM   Specimen: Nasal Mucosa; Nasal Swab  Result Value Ref Range Status   MRSA by PCR Next Gen NOT DETECTED NOT DETECTED Final    Comment: (NOTE) The GeneXpert MRSA Assay (FDA approved for NASAL specimens only), is one component of a comprehensive MRSA colonization surveillance program. It is not intended to diagnose MRSA infection nor to guide or monitor treatment for MRSA infections. Test performance is not FDA approved in patients less than  74 years old. Performed at [facility], [place] [address], [place], [place] [address]   Resp panel by RT-PCR (RSV, Flu A&B, Covid) Anterior Nasal Swab     Status: None   Collection Time: 03/13/2022  8:06 PM   Specimen: Anterior Nasal Swab  Result Value Ref Range Status   SARS Coronavirus 2 by RT PCR NEGATIVE NEGATIVE Final    Comment: (NOTE) SARS-CoV-2 target nucleic acids are NOT DETECTED.  The SARS-CoV-2 RNA is generally detectable in upper respiratory specimens during the acute phase of infection. The lowest concentration of SARS-CoV-2 viral copies this assay can detect is 138 copies/mL. A negative result does not preclude SARS-Cov-2 infection and should not be used as the sole basis for treatment or other patient management decisions. A negative result may occur with  improper specimen collection/handling, submission of specimen other than nasopharyngeal swab, presence of viral mutation(s) within the areas targeted by this assay, and inadequate number of viral copies(<138 copies/mL). A negative result must be combined with clinical observations, patient history, and epidemiological information. The expected result is Negative.  Fact Sheet for Patients:  EntrepreneurPulse.com.au  Fact Sheet for Healthcare Providers:  IncredibleEmployment.be  This test is no t yet approved or cleared by the [place] FDA and  has been authorized for detection and/or diagnosis of SARS-CoV-2 by FDA under an Emergency Use Authorization (EUA). This EUA will  remain  in effect (meaning this test can be used) for the duration of the COVID-19 declaration under Section 564(b)(1) of the Act, 21 U.S.C.section 360bbb-3(b)(1), unless the authorization is terminated  or revoked sooner.       Influenza A by PCR NEGATIVE NEGATIVE Final   Influenza B by PCR NEGATIVE NEGATIVE Final    Comment: (NOTE) The Xpert Xpress SARS-CoV-2/FLU/RSV plus assay is intended as an  aid in the diagnosis of influenza from Nasopharyngeal swab specimens and should not be used as a sole basis for treatment. Nasal washings and aspirates are unacceptable for Xpert Xpress SARS-CoV-2/FLU/RSV testing.  Fact Sheet for Patients: EntrepreneurPulse.com.au  Fact Sheet for Healthcare Providers: IncredibleEmployment.be  This test is not yet approved or cleared by the [place] FDA and has been authorized for detection and/or diagnosis of SARS-CoV-2 by FDA under an Emergency Use Authorization (EUA). This EUA will remain in effect (meaning this test can be used) for the duration of the COVID-19 declaration under Section 564(b)(1) of the Act, 21 U.S.C. section 360bbb-3(b)(1), unless the authorization is terminated or revoked.     Resp Syncytial Virus by PCR NEGATIVE NEGATIVE Final    Comment: (NOTE) Fact Sheet for Patients: EntrepreneurPulse.com.au  Fact Sheet for Healthcare Providers: IncredibleEmployment.be  This test is not yet approved or cleared by the [place] FDA and has been authorized for detection and/or diagnosis of SARS-CoV-2 by FDA under an Emergency Use Authorization (EUA). This EUA will remain in effect (meaning this test can be used) for the duration of the COVID-19 declaration under Section 564(b)(1) of the Act, 21 U.S.C. section 360bbb-3(b)(1), unless the authorization is terminated or revoked.  Performed at [facility], [place] [address], [place], [place] [address]   Respiratory (~20 pathogens) panel by PCR     Status: None   Collection Time: 03/08/2022  8:06 PM   Specimen: Anterior Nasal Swab; Respiratory  Result Value Ref Range Status   Adenovirus NOT DETECTED NOT DETECTED Final   Coronavirus 229E NOT DETECTED NOT DETECTED Final    Comment: (NOTE) The Coronavirus on the Respiratory Panel, DOES NOT test for the novel  Coronavirus (2019 nCoV)    Coronavirus HKU1 NOT  DETECTED NOT DETECTED Final   Coronavirus NL63 NOT DETECTED NOT DETECTED Final   Coronavirus OC43 NOT DETECTED NOT DETECTED Final   Metapneumovirus NOT DETECTED NOT DETECTED Final   Rhinovirus / Enterovirus NOT DETECTED NOT DETECTED Final   Influenza A NOT DETECTED NOT DETECTED Final   Influenza B NOT DETECTED NOT DETECTED Final   Parainfluenza Virus 1 NOT DETECTED NOT DETECTED Final   Parainfluenza Virus 2 NOT DETECTED NOT DETECTED Final   Parainfluenza Virus 3 NOT DETECTED NOT DETECTED Final   Parainfluenza Virus 4 NOT DETECTED NOT DETECTED Final   Respiratory Syncytial Virus NOT DETECTED NOT DETECTED Final   Bordetella pertussis NOT DETECTED NOT DETECTED Final   Bordetella Parapertussis NOT DETECTED NOT DETECTED Final   Chlamydophila pneumoniae NOT DETECTED NOT DETECTED Final   Mycoplasma pneumoniae NOT DETECTED NOT DETECTED Final    Comment: Performed at [facility], [place] [address], [place], [place] [address]  Expectorated Sputum Assessment w Gram Stain, Rflx to Resp Cult     Status: None   Collection Time: 03/01/2022 11:14 PM   Specimen: Sputum  Result Value Ref Range Status   Specimen Description SPUTUM  Final   Special Requests NONE  Final   Sputum evaluation   Final    Sputum specimen not acceptable  for testing.  Please recollect.   Results Called to: Ashok Pall., RN AT 2334 ON 03/05/2022 BY LJM Performed at [facility], [place] [address] [place], [place] [address]    Report Status 03/17/2022 FINAL  Final  Expectorated Sputum Assessment w Gram Stain, Rflx to Resp Cult     Status: None   Collection Time: 03/13/22  5:25 AM  Result Value Ref Range Status   Specimen Description EXPECTORATED SPUTUM  Final   Special Requests NONE  Final   Sputum evaluation   Final    THIS SPECIMEN IS ACCEPTABLE FOR SPUTUM CULTURE Performed at [facility], [place] [address] [place], [place] [address]    Report Status 03/13/2022 FINAL  Final  Culture,  Respiratory w Gram Stain     Status: None   Collection Time: 03/13/22  5:25 AM  Result Value Ref Range Status   Specimen Description   Final    EXPECTORATED SPUTUM Performed at [place] [address], [place], [place] [address]    Special Requests   Final    NONE Reflexed from [id number] Performed at [place] [address] [place], [place] [address]    Gram Stain   Final    MODERATE WBC PRESENT, PREDOMINANTLY PMN FEW GRAM NEGATIVE RODS RARE SQUAMOUS EPITHELIAL CELLS PRESENT Performed at [facility], [place] [address], [place], [place] [address]    Culture RARE CANDIDA ALBICANS  Final   Report Status 03/16/2022 FINAL  Final         Radiology Studies: No results found.      Scheduled Meds:  hydrocortisone cream   Topical TID   mouth rinse  15 mL Mouth Rinse 4 times per day   Continuous Infusions:  morphine 3 mg/hr (03/20/22 1107)     LOS: 8 days   The patient is critically ill with multiple organ systems failure and requires high complexity decision making for assessment and support, frequent evaluation and titration of therapies, application of advanced monitoring technologies and extensive interpretation of multiple databases. Critical Care Time devoted to patient care services described in this note  Time spent: 10 minutes     [place] Hijazi, Geraldo Docker, MD [facility]   If 7PM-7AM, please contact night-coverage 03/20/2022, 11:59 AM

## 2022-03-20 NOTE — TOC Progression Note (Signed)
Transition of Care [facility]) - Progression Note    Patient Details  Name: [name] MRN: [id number] Date of Birth: 01/02/1933  Transition of [facility] CM/SW [place], [place] Phone Number: 03/20/2022, 2:10 PM  Clinical Narrative:     12:12 pm-CSW reached out to Rolena Infante to speak with charge RN in reference to pt receiving hospice services in house that include IV morphine drip, charge RN not available, vm left.   1:59 pm-CSW attempted to reach pt's daughter to provide hospice options, vm left.   Expected Discharge Plan: [place] Barriers to Discharge: Continued Medical Work up  Expected Discharge Plan and Services In-house Referral: NA Discharge Planning Services: CM Consult Post Acute Care Choice: [place] arrangements for the past 2 months: Apartment                 DME Arranged: Oxygen DME Agency: [employer]       HH Arranged: RN, PT [place] Agency: [place] Date [place]: 03/14/22 Time [place]: 4034 Representative spoke with at [place]: [place] Determinants of Health ([place]) Interventions [place]: No Food Insecurity (12/02/2021)  Housing: Low Risk  (03/20/2022)  Transportation Needs: No Transportation Needs (12/02/2021)  Utilities: Not At Risk (12/02/2021)  Alcohol Screen: Low Risk  (02/08/2021)  Depression (PHQ2-9): Medium Risk (01/28/2022)  Financial Resource Strain: Low Risk  (02/08/2021)  Physical Activity: Insufficiently Active (02/08/2021)  Social Connections: Moderately Integrated (02/08/2021)  Stress: No Stress Concern Present (02/08/2021)  Tobacco Use: Low Risk  (03/13/2022)    Readmission Risk Interventions    10/05/2021   10:34 AM  Readmission Risk Prevention Plan  Post Dischage Appt Complete  Medication Screening Complete  Transportation Screening Complete

## 2022-03-20 NOTE — Progress Notes (Signed)
Daily Progress Note   Patient Name: [name]       Date: 03/20/2022 DOB: 1932-05-29  Age: 89 y.o. MRN#: [id number] Attending Physician: Allie Bossier, MD Primary Care Physician: Dettinger, [name], MD Admit Date: 03/05/2022 Length of Stay: 8 days  Reason for Consultation/Follow-up: Establishing goals of care  Subjective:   CC: Patient agreeing with removal of [place] for his comfort. Discussed care with patient and family at bedside. Following up regarding complex medical decision making and symptom management.  Subjective:  Reviewed EMR prior to presenting to bedside.  Over the past 24 hours at time of EMR review patient has received glycopyrrolate x 1 dose and IV morphine 3 mg bolus x 2 doses.  When presenting to bedside, patient laying in bed on nasal cannula.  Multiple family members at bedside including patient's daughter and son.  Introduced myself as a member of the palliative medicine team.  When checking with patient, he is in agreements to removing nasal cannula as it causes him discomfort.  Noted would continue to adjust medications to properly manage his symptoms in the setting of end-of-life.  When seeing patient today he has increased work of breathing so noted would further adjust morphine continuous basal infusion as well as morphine bolus available.  Discussed next steps in care including continuing comfort care here though will involve TOC to assist with possible inpatient hospice referral.  Daughter noted patient might be able to return to the facility had with hospice support though she was unsure if they could manage a morphine drip.  Noted this would be a necessity and a determination about returning there versus inpatient hospice.  Will still place [facility] referral at this time to assist with inpatient hospice referral.  All questions answered at that time.  Provided emotional support via active listening.  Thanked patient and family for allowing me to visit  today.  Followed up with care team regarding this conversation.  Review of Systems Increased work of breathing noted when seeing patient Objective:   Vital Signs:  BP 118/75 (BP Location: Left Arm)   Pulse 95   Temp 98.3 F (36.8 C)   Resp 18   Ht '5\' 6"'$  (1.676 m)   Wt 73.3 kg   SpO2 (!) 89%   BMI 26.08 kg/m   Physical Exam: General: laying in bed, awake, interactive though less responsive than yesterday, pleasant  Eyes: no drainage notes HENT: dry mucous membranes Cardiovascular: RRR Respiratory: increased work of breathing noted, on  support Abdomen: not distended Extremities: decreased strength in UEs Skin: no rashes or lesions on visible skin Neuro: awake, appropriate interactions though slowed as compared to yesterday  Imaging:  I personally reviewed recent imaging.   Assessment & Plan:   Assessment: Patient is an 89 year old male with a past medical history of ascending aortic aneurysm, atrial fibrillation, CAD, GERD, COPD, bronchiectasis (never smoker) with chronic hypoxic respiratory failure on nasal cannula support at home, and recent RSV bronchitis who was admitted on 03/19/2022 for management of blood-tinged sputum after [facility] being at [facility] for [facility].  During hospitalization patient has required management in ICU for hypotension requiring pressor support and acute on chronic hypoxic respiratory failure requiring high flow nasal cannula support in setting of multilobar PNA.  Patient now expressing wishes that this is not quality of life to him and he does not want to continue in this manner.  Palliative medicine team consulted to assist with complex medical decision making.   Recommendations/Plan: # Complex  medical decision making/goals of care:  -Patient was transition to comfort focused care on 03/19/2022 as per his own wishes.  Family in agreements with supporting patient's wishes for care.  Continuing comfort focused care at this time.  Patient's family in  agreements with referral for inpatient hospice placement.                 -  Code Status: DNR   # Symptom management:  -Pain/Dyspnea, acute in the setting of end-of-life care                               -Increase IV morphine to '3mg'$ /hr continuous basal infusion with bolus dose of '4mg'$  q83mns prn for breakthrough management.  Continue to adjust based on patient's symptom burden.                   -Anxiety/agitation, in the setting of end-of-life care                               -Continue IV Ativan 1 mg every 4 hours as needed. Continue to adjust based on patient's symptom burden.                                 -And also has IV Haldol 0.5 mg every 4 hours as needed. Continue to adjust based on patient's symptom burden.                   -Secretions, in the setting of end-of-life care                               -Continue IV glycopyrrolate 0.2 mg every 4 hours as needed.  # Psychosocial Support:  - Support System: daughter, son-in-law, sons   # Discharge Planning: Continue comfort focused care at this time. TOC referral placed for inpatient hospice evaluation.   Discussed with: patient, family, hospitalist, TOC  Thank you for allowing the palliative care team to participate in the care DEstelle Grumbles  LChelsea Aus DO Palliative Care Provider PMT # [phone] If patient remains symptomatic despite maximum doses, please call PMT at [phone] 0700 and 1900. Outside of these hours, please call attending, as PMT does not have night coverage.

## 2022-03-20 NOTE — Plan of Care (Signed)
  Problem: Coping: Goal: Level of anxiety will decrease Outcome: Progressing   Problem: Pain Managment: Goal: General experience of comfort will improve Outcome: Progressing

## 2022-03-20 NOTE — TOC Progression Note (Signed)
Transition of Care [facility]) - Progression Note    Patient Details  Name: [name] MRN: [id number] Date of Birth: Jan 17, 1933  Transition of Care [facility]) CM/SW [place], [place] Phone Number: 03/20/2022, 3:39 PM  Clinical Narrative:     CSW received return call from pt's daughter. She states she also reached out to ALF and they are unable to manage pt's needs for hospice care. CSW provided choices for residential hospice, daughter states [facility] is most convenient, CSW explained [facility] not accepting inpatient referrals at this time. Daughter chooses [facility]. CSW reached out to [facility], was told Intake is not in the office today. Refferal will be completed tomorrow by TOC. MD updated.   Expected Discharge Plan: [place] Barriers to Discharge: Continued Medical Work up  Expected Discharge Plan and Services In-house Referral: NA Discharge Planning Services: CM Consult Post Acute Care Choice: [place] arrangements for the past 2 months: Apartment                 DME Arranged: Oxygen DME Agency: [employer]       HH Arranged: RN, PT [place] Agency: [place] Date [place]: 03/14/22 Time [place]: 3300 Representative spoke with at [place]: [place] Determinants of Health ([place]) Interventions [place]: No Food Insecurity (12/02/2021)  Housing: Low Risk  (03/20/2022)  Transportation Needs: No Transportation Needs (12/02/2021)  Utilities: Not At Risk (12/02/2021)  Alcohol Screen: Low Risk  (02/08/2021)  Depression (PHQ2-9): Medium Risk (01/28/2022)  Financial Resource Strain: Low Risk  (02/08/2021)  Physical Activity: Insufficiently Active (02/08/2021)  Social Connections: Moderately Integrated (02/08/2021)  Stress: No Stress Concern Present (02/08/2021)  Tobacco Use: Low Risk  (03/13/2022)    Readmission Risk  Interventions    10/05/2021   10:34 AM  Readmission Risk Prevention Plan  Post Dischage Appt Complete  Medication Screening Complete  Transportation Screening Complete

## 2022-03-22 ENCOUNTER — Ambulatory Visit: Payer: Medicare Other | Admitting: Primary Care

## 2022-03-28 ENCOUNTER — Ambulatory Visit: Payer: Medicare Other | Admitting: Cardiology

## 2022-03-31 ENCOUNTER — Ambulatory Visit: Payer: Medicare Other | Admitting: Family Medicine

## 2022-03-31 NOTE — Discharge Summary (Signed)
Physician Discharge Summary  [name] IEP:[id number] DOB: [birth year] DOA: 03/20/2022  PCP: Dettinger, [name], MD  Admit date: 02/28/2022 Discharge date: 03/28/2022  Time spent: 0 minutes  Recommendations for Outpatient Follow-up: Acute on chronic respiratory failure with hypoxia -Multifactorial see below.  Multifocal pneumonia -Complete course 7-day course of antibiotics - Flutter valve - Incentive spirometry - DuoNeb QID -Xopenex PRN -Solu-Medrol 80 mg daily---> PCCM changed to Prednisone 40 mg daily - Hypertonic saline nebs BID -Chest Physiotherapy via bed QID -Sputum pending - Viral cultures negative to date -1/18  PCXR; shows slight improvement RML, LEFT costovertebral angle however overall no significant improvement.  -1/20 this a.m. patient informed me he wished to change his care to comfort care   Bronchiectasis -1/15 CXR Persistent interstitial and airspace opacities in the lungs bilaterally   Leukocytosis - 1/18 afebrile, negative left shift, negative bands.  Most likely secondary to steroids     Acute on Chronic Diastolic CHF  -BNP elevated, pedal edema present on admission -Lasix IV 20 mg BID (hold) increased creatinine/soft BP.  -Strict in and out +2.4 L - Daily weight      Filed Weights    03/14/22 0500 03/18/22 0500 03/19/22 0500  Weight: 67 kg 71.6 kg 73.3 kg  -Last echocardiogram 02/07/2022 preserved EF, diastolic dysfunction cannot be assessed -1/20 this a.m. patient informed me he wished to change his care to comfort care   Hypotension:  - 1/18 Midodrine 2.5 mg BID -1/19 Albumin 50 g x 1   Paroxysmal atrial fibrillation with RVR -Currently not rate controlled -Not on anticoagulation due to GI bleed - 1/18 EKG see/W A-fib with RVR -1/18 Toprol 50 mg daily (home dose), ADDENDUM unable to tolerate large pills therefore cannot swallow Toprol. - 1/18 Amiodarone drip -1/19 HR still not optimal however BP borderline.  After patient receives  albumin may consider increasing beta-blocker, or giving patient low-dose digoxin -1/19 Metoprolol 25 mg BID -1/20 this a.m. patient informed me he wished to change his care to comfort care   History of coronary artery disease -Troponins negative -Negative chest pain    Hypothyroidism -Synthroid 75 mcg daily     GERD Continue with PPI     Hypokalemia -Potassium goal> 4.    Hypocalcemia - calcium goal> 8.9 - 1/19 corrected calcium = 8.8  - 1/19 calcium gluconate 2 g     Anemia of chronic disease -Hemoglobin 9-11   -Anemia panel showing low iron .  -Completion of antibiotics iron + vitamin C supplements .  Recent Labs       Lab Results  Component Value Date    HGB 9.7 (L) 03/19/2022    HGB 9.8 (L) 03/18/2022    HGB 10.5 (L) 03/17/2022    HGB 10.3 (L) 03/16/2022    HGB 9.3 (L) 03/15/2022    -Stable transfuse for hemoglobin <7  -1/20 this a.m. patient informed me he wished to change his care to comfort care    Goals of care - 1/20 per patient's request spoke at length with Clarisse Gouge (daughter) to inform her of his decision to change his care plan to comfort care.  Answered all questions.  Informed her that Dr. Vinetta Bergamo (palliative care) would be in touch with patient and family to discuss comfort care options. -Dr. Vinetta Bergamo (palliative care) is awaiting arrival of son and daughter and once patient off of all medications and they have said their goodbyes will decide on residential hospice vs home hospice.  Will await her recommendation. -  1/20 this a.m. patient informed me he wished to change his care to comfort care -1/21 discussed case with Dr. Chelsea Aus palliative care and she has put in request for inpatient hospice.  Will await to hear from [facility].  Discharge Diagnoses:  Principal Problem:   Acute on chronic respiratory failure with hypoxia (HCC) Active Problems:   Paroxysmal atrial fibrillation (HCC)   DYSPNEA   Bronchiectasis (HCC)   Lobar pneumonia, unspecified  organism (HCC)   Malnutrition of moderate degree   Acute on chronic diastolic CHF (congestive heart failure) (HCC)   Hypotension, unspecified   CAD in native artery   Anemia of chronic disease   Bronchiectasis with (acute) exacerbation (HCC)   DNR (do not resuscitate)   Pain   High risk medication use   End of life care   Goals of care, counseling/discussion   Palliative care encounter   Counseling and coordination of care    Filed Weights   03/18/22 0500 03/19/22 0500 April 09, 2022 0500  Weight: 71.6 kg 73.3 kg 73.3 kg    History of present illness:  [name] is a 89 y.o. WM PMHx Ascending aorta aneurysm, atrial fibrillation, CAD, GERD, COPD, depression,    Presents to ED for sob , blood tinged  productive sputum since few weeks. Patient is a poor historian and no family at bedside. He was recently admitted for RSV bronchitis, and discharged to ALF. He is chronically on  4 lit of [place] oxygen. H presents this admission for sob and an episode of hemoptysis and worsening pedal edema. He was admitted for acute on chronic respiratory failure with hypoxia sec to combination of pneumonia and CHF.  Hospital course complicated by aspiration and vomiting, requiring heated high flow oxygen. He was transferred to stepdown for closer monitoring. PCCM consulted for recommendations. Advanced directives in place with DNR/DNI. Waiting for clinical improvement.   Hospital Course:  -1/20 this a.m. patient informed me he wished to change his care to comfort care   Consultants:  PCCM     Procedures/Significant Events:  1/18  PCXR; shows slight improvement RML, LEFT costovertebral angle however overall no significant improvement.     I have personally reviewed and interpreted all radiology studies and my findings are as above.   VENTILATOR SETTINGS: HFNC 1/20 Flow 4 L/min SpO2 97%       Cultures COVID PCR, influenza PCR and RVP, strep pneumonia have been negative. Legionella antigen is  negative 1/13 influenza A/B negative 1/13 RSV negative 1/13 respiratory virus panel negative 1/13 SARS coronavirus negative 1/14 sputum positive few GNR   Antibiotics Anti-infectives (From admission, onward)    Start     Ordered Stop   03/16/22 1000  azithromycin (ZITHROMAX) 500 mg in sodium chloride 0.9 % 250 mL IVPB        03/15/22 1307 03/17/22 0955   03/15/22 1200  azithromycin (ZITHROMAX) 500 mg in sodium chloride 0.9 % 250 mL IVPB  Status:  Discontinued        03/15/22 0944 03/15/22 1307   03/15/22 1000  azithromycin (ZITHROMAX) tablet 500 mg  Status:  Discontinued        03/14/22 1142 03/15/22 0944   03/15/22 1000  metroNIDAZOLE (FLAGYL) IVPB 500 mg  Status:  Discontinued        03/15/22 0906 03/19/22 1129   03/13/22 1130  azithromycin (ZITHROMAX) 500 mg in sodium chloride 0.9 % 250 mL IVPB  Status:  Discontinued        03/13/22 1126 03/14/22  1142   03/20/2022 1830  vancomycin (VANCOCIN) IVPB 1000 mg/200 mL premix  Status:  Discontinued        03/25/2022 1740 03/14/22 1147   03/18/2022 1800  ceFEPIme (MAXIPIME) 2 g in sodium chloride 0.9 % 100 mL IVPB  Status:  Discontinued        03/19/2022 1740 03/19/22 1120         Discharge Exam: Vitals:   03/19/22 2100 03/19/22 2200 03/20/22 0531 April 18, 2022 0500  BP:  122/80 118/75   Pulse:  (!) 111 95   Resp: '20 20 18   '$ Temp:  (!) 97.4 F (36.3 C) 98.3 F (36.8 C)   TempSrc:  Oral    SpO2:  92% (!) 89%   Weight:    73.3 kg  Height:    '5\' 6"'$  (1.676 m)     Notified by RN that patient is deceased as of 0552 hrs   Patient was DNR/CMO (comfort measures only)   2 RN verified.   Family notified by Nursing.         Gershon Cull MSNA ACNPC-AG Acute Care Nurse Practitioner  Discharge Instructions   Allergies as of 04-18-2022       Reactions   Gadavist [gadobutrol] Hives   Prednisone Other (See Comments)   Agitation and hallucinations   Betadine [povidone Iodine] Other (See Comments)   Blisters    Lortab  [hydrocodone-acetaminophen] Nausea And Vomiting   Penicillins Other (See Comments)   Lightheadedness  Has patient had a PCN reaction causing immediate rash, facial/tongue/throat swelling, SOB or lightheadedness with hypotension: Yes Has patient had a PCN reaction causing severe rash involving mucus membranes or skin necrosis: No Has patient had a PCN reaction that required hospitalization No Has patient had a PCN reaction occurring within the last 10 years: No If all of the above answers are "NO", then may proceed with Cephalosporin use.   Zetia [ezetimibe] Other (See Comments)   Muscle weakness    Zocor [simvastatin] Nausea Only, Other (See Comments)   Muscle weakness         Medication List     ASK your doctor about these medications    albuterol (2.5 MG/3ML) 0.083% nebulizer solution Commonly known as: PROVENTIL Take 3 mLs (2.5 mg total) by nebulization every 4 (four) hours as needed for wheezing or shortness of breath.   budesonide-formoterol 160-4.5 MCG/ACT inhaler Commonly known as: Symbicort Inhale 2 puffs into the lungs in the morning and at bedtime.   cabergoline 0.5 MG tablet Commonly known as: DOSTINEX Take 1 tablet (0.5 mg total) by mouth 2 (two) times a week.   dextromethorphan 15 MG/5ML syrup Take 10 mLs (30 mg total) by mouth 4 (four) times daily as needed for cough.   dextromethorphan-guaiFENesin 30-600 MG 12hr tablet Commonly known as: MUCINEX DM Take 1 tablet by mouth 2 (two) times daily.   diclofenac Sodium 1 % Gel Commonly known as: VOLTAREN Apply 2 g topically 4 (four) times daily.   FeroSul 325 (65 FE) MG tablet Generic drug: ferrous sulfate TAKE (1) TABLET DAILY WITH BREAKFAST.   Flutter Devi Twice a day and prn as needed, may increase if feeling worse   furosemide 20 MG tablet Commonly known as: Lasix Take 1 tablet daily x 5 days   GaviLAX 17 GM/SCOOP powder Generic drug: polyethylene glycol powder DISSOLVE 17 GM SCOOP IN 8 OZ. OF WATER  AND DRINK ONCE DAILY AS NEEDED FOR CONSTIPATION   levalbuterol 45 MCG/ACT inhaler Commonly known as: XOPENEX HFA  Inhale 1 puff into the lungs every 4 (four) hours as needed for wheezing.   levofloxacin 500 MG tablet Commonly known as: LEVAQUIN Take 1 tablet (500 mg total) by mouth daily.   levothyroxine 75 MCG tablet Commonly known as: SYNTHROID Take 1 tablet (75 mcg total) by mouth daily before breakfast.   magnesium oxide 400 (241.3 Mg) MG tablet Commonly known as: MAG-OX Take 1 tablet (400 mg total) by mouth 2 (two) times daily.   METAMUCIL SMOOTH TEXTURE PO Take 1 packet by mouth in the morning.   Metamucil 28 % packet Generic drug: psyllium Take 1 packet by mouth daily.   metoprolol succinate 50 MG 24 hr tablet Commonly known as: TOPROL-XL Take 1 tablet (50 mg total) by mouth daily. Take with or immediately following a meal.   nitroGLYCERIN 0.4 MG SL tablet Commonly known as: NITROSTAT Place 1 tablet (0.4 mg total) under the tongue every 5 (five) minutes as needed for chest pain.   pantoprazole 40 MG tablet Commonly known as: PROTONIX Take 1 tablet (40 mg total) by mouth 2 (two) times daily.   predniSONE 20 MG tablet Commonly known as: DELTASONE Take 2 tablets (40 mg total) by mouth daily with breakfast.   sodium chloride HYPERTONIC 3 % nebulizer solution Take by nebulization as needed for other.   sucralfate 1 g tablet Commonly known as: CARAFATE Take 1 tablet (1 g total) by mouth 4 (four) times daily -  with meals and at bedtime.   tamsulosin 0.4 MG Caps capsule Commonly known as: FLOMAX Take 1 capsule (0.4 mg total) by mouth daily after supper.   traMADol 50 MG tablet Commonly known as: ULTRAM Take 1 tablet (50 mg total) by mouth 2 (two) times daily as needed.       Allergies  Allergen Reactions   Gadavist [Gadobutrol] Hives   Prednisone Other (See Comments)    Agitation and hallucinations   Betadine [Povidone Iodine] Other (See Comments)     Blisters    Lortab [Hydrocodone-Acetaminophen] Nausea And Vomiting   Penicillins Other (See Comments)    Lightheadedness  Has patient had a PCN reaction causing immediate rash, facial/tongue/throat swelling, SOB or lightheadedness with hypotension: Yes Has patient had a PCN reaction causing severe rash involving mucus membranes or skin necrosis: No Has patient had a PCN reaction that required hospitalization No Has patient had a PCN reaction occurring within the last 10 years: No If all of the above answers are "NO", then may proceed with Cephalosporin use.   Zetia [Ezetimibe] Other (See Comments)    Muscle weakness    Zocor [Simvastatin] Nausea Only and Other (See Comments)    Muscle weakness     Follow-up Information     [facility] [place] Follow [facility].   Specialty: [place] Why: A representative with [facility] will contact you within 24-48 hours from discharge, for physical therapy and RN home health services. Contact information: [address] [place] [address] [phone]                  The results of significant diagnostics from this hospitalization (including imaging, microbiology, ancillary and laboratory) are listed below for reference.    Significant Diagnostic Studies: DG CHEST PORT 1 VIEW  Result Date: 03/17/2022 CLINICAL DATA:  Acute respiratory failure with hypoxia. EXAM: PORTABLE CHEST 1 VIEW COMPARISON:  Radiographs 03/14/2022 and 03/26/2022.  CT 03/30/2022. FINDINGS: 0446 hours. The heart size and mediastinal contours appear stable with mild cardiomegaly  and aortic atherosclerosis. There is central prominence of the pulmonary arteries. Bilateral airspace opacities and small pleural effusions appear unchanged. No evidence of pneumothorax. No acute osseous findings are evident. Telemetry leads overlie the chest. IMPRESSION: No significant change in bilateral airspace opacities and small pleural effusions. Electronically  Signed   By: Richardean Sale M.D.   On: 03/17/2022 08:21   DG ESOPHAGUS W SINGLE CM (SOL OR THIN BA)  Result Date: 03/16/2022 CLINICAL DATA:  History of esophageal stricture, hiatal hernia and esophagitis. Patient complains of solid-food and medication dysphagia. Patient referred for fluoroscopic single contrast esophagram study. EXAM: ESOPHAGUS/BARIUM SWALLOW/TABLET STUDY TECHNIQUE: Single contrast examination was performed using thin liquid barium. This exam was performed by Narda Rutherford, NP, and was supervised and interpreted by Richardean Sale, MD. FLUOROSCOPY: Radiation Exposure Index (as provided by the fluoroscopic device): 24.30 mGy Kerma COMPARISON:  Chest CTA 03/24/2022, chest radiographs 03/14/2022 and esophagram 08/10/2017 FINDINGS: Limited exam due to patient's inability to stand or position on the fluoroscopy table. No barium tablet administered. Swallowing: Appears normal. No vestibular penetration or aspiration seen. Pharynx: Prominent cricopharyngeal bar noted on swallowing, similar to previous study. Esophagus: Esophagus is patulous in appearance with a distal esophageal stricture, similar to the previous examination. There is a persistent, well-circumscribed filling defect within the distal esophagus which is suspicious for a pedunculated polyp. This appears non ulcerated. Esophageal motility: Stable mild esophageal dysmotility with mild tertiary contractions. Hiatal Hernia: Small reducible hiatal hernia. Previous Nissen fundoplication. Gastroesophageal reflux: None visualized with provocation maneuvers. Ingested 71m barium tablet: 13 mm barium tablet not given due to patient's inability to swallow pills. Other: None. IMPRESSION: 1. Persistent/recurrent distal esophageal stricture, incompletely evaluated in this patient unable to swallow pills. 2. Smoothly marginated filling defect at the gastroesophageal junction, suspicious for a pedunculated polyp. 3. Unchanged prominent cricopharyngeus  impression on the posterior wall of the esophagus and mild esophageal dysmotility. Electronically Signed   By: WRichardean SaleM.D.   On: 03/16/2022 10:38   DG Abd 1 View  Result Date: 03/15/2022 CLINICAL DATA:  Vomiting EXAM: ABDOMEN - 1 VIEW COMPARISON:  CT scan 09/30/2021 FINDINGS: Substantial airspace opacity in the left lower lobe in both mid lungs suggesting multilobar pneumonia or aspiration pneumonitis. Blunted left costophrenic angle favoring left pleural effusion. Mild levoconvex lumbar scoliosis. Bilateral total hip prostheses are partially observed. Unremarkable bowel gas pattern. Formed stool in the colon. No dilated bowel. IMPRESSION: 1. Substantial airspace opacity in the left lower lobe and both mid lungs suggesting multilobar pneumonia or aspiration pneumonitis. 2. Blunted left costophrenic angle favoring a small left pleural effusion. 3. Normal bowel gas pattern. Electronically Signed   By: WVan ClinesM.D.   On: 03/15/2022 12:26   DG CHEST PORT 1 VIEW  Result Date: 03/14/2022 CLINICAL DATA:  Dyspnea. EXAM: PORTABLE CHEST 1 VIEW COMPARISON:  03/14/2022. FINDINGS: The heart is enlarged and the mediastinal contour is stable. There is atherosclerotic calcification of the aorta. There are persistent interstitial and airspace opacities in the lungs bilaterally, not significantly changed from the prior exam. There are small bilateral pleural effusions. No pneumothorax. No acute osseous abnormality. IMPRESSION: 1. Persistent interstitial and airspace opacities in the lungs bilaterally, not significantly changed from the prior exam. 2. Small bilateral pleural effusions. Electronically Signed   By: LBrett FairyM.D.   On: 03/14/2022 22:58   DG CHEST PORT 1 VIEW  Result Date: 03/14/2022 CLINICAL DATA:  Hypoxia EXAM: PORTABLE CHEST 1 VIEW COMPARISON:  03/06/2022 FINDINGS: Single frontal view of  the chest demonstrates stable enlargement of the cardiac silhouette. There is multifocal  bilateral airspace disease, with a waxing and waning appearance since prior study. Slight clearing of the consolidation in the left midlung zone, with increased airspace disease in the left upper lobe and right perihilar regions. Persistent dense left basilar consolidation and likely underlying small pleural effusion. No pneumothorax. IMPRESSION: 1. Waxing and waning bilateral multifocal airspace disease, with overall slight progression since prior study, consistent with worsening edema or infection. 2. Stable left pleural effusion. Electronically Signed   By: Randa Ngo M.D.   On: 03/14/2022 15:46   [place] EKG SITE RITE  Result Date: 03/14/2022 If Site Rite image not attached, placement could not be confirmed due to current cardiac rhythm.  CT Angio Chest PE W and/or Wo Contrast  Result Date: 03/01/2022 CLINICAL DATA:  Pulmonary embolism suspected, high probability. Coughing blood. EXAM: CT ANGIOGRAPHY CHEST WITH CONTRAST TECHNIQUE: Multidetector CT imaging of the chest was performed using the standard protocol during bolus administration of intravenous contrast. Multiplanar CT image reconstructions and MIPs were obtained to evaluate the vascular anatomy. RADIATION DOSE REDUCTION: This exam was performed according to the departmental dose-optimization program which includes automated exposure control, adjustment of the mA and/or kV according to patient size and/or use of iterative reconstruction technique. CONTRAST:  176m OMNIPAQUE IOHEXOL 350 MG/ML SOLN COMPARISON:  Chest CT dated 09/30/2021. FINDINGS: Cardiovascular: Study is limited by patient motion artifact, however, there is no pulmonary embolism identified within the main, lobar or central segmental pulmonary arteries bilaterally. No pericardial effusion. Aortic atherosclerosis. Three-vessel coronary artery calcifications. Mediastinum/Nodes: No mass or enlarged lymph nodes are identified within the mediastinum. Scattered small and moderate-sized  lymph nodes are likely reactive in nature. Lungs/Pleura: Diffuse bilateral airspace consolidations and ground-glass opacities, most confluence at the bilateral lung bases and within the LEFT upper lobe. Small bilateral pleural effusions. No pneumothorax. Upper Abdomen: Limited images of the upper abdomen are unremarkable. Musculoskeletal: No acute findings. Chronic compression fracture deformity of a midthoracic vertebral body. Review of the MIP images confirms the above findings. IMPRESSION: 1. Diffuse bilateral airspace consolidations and ground-glass opacities, most confluence at the bilateral lung bases and within the LEFT upper lobe, compatible with multifocal pneumonia, most likely atypical/viral. 2. Small bilateral pleural effusions. 3. No pulmonary embolism identified, with mild study limitations detailed above. 4. Three-vessel coronary artery calcifications. Aortic Atherosclerosis (ICD10-I70.0). Electronically Signed   By: SFranki CabotM.D.   On: 03/27/2022 14:17   DG Chest 2 View  Result Date: 03/20/2022 CLINICAL DATA:  hematemesis, sob, leg swelling EXAM: CHEST - 2 VIEW COMPARISON:  March 10, 2022 FINDINGS: The cardiomediastinal silhouette is unchanged in contour. Small bilateral pleural effusions. No pneumothorax. Continued increased confluent bilateral peripheral pulmonary opacities. There are increasing diffuse reticular opacities in comparison to prior superimposed on a background of emphysema. Visualized abdomen is unremarkable. Multilevel degenerative changes of the thoracic spine. IMPRESSION: 1. Increasing diffuse reticular opacities and confluent peripheral pulmonary opacities. Differential considerations include pulmonary edema and or multifocal infection. Recommend follow-up to resolution. 2. Small bilateral pleural effusions. Electronically Signed   By: SValentino SaxonM.D.   On: 03/03/2022 12:29   DG Chest 2 View  Result Date: 03/10/2022 CLINICAL DATA:  Bronchiectasis  exacerbtion; Cough x 2-3 weeks EXAM: CHEST - 2 VIEW COMPARISON:  February 09, 2022 FINDINGS: The cardiomediastinal silhouette is unchanged in contour.Atherosclerotic calcifications. No pleural effusion. No pneumothorax. Increased confluent opacities in the RIGHT lung laterally, LEFT mid lung with overall increased bibasilar predominant  reticulonodular opacities. Visualized abdomen is unremarkable. Multilevel degenerative changes of the thoracic spine. IMPRESSION: Increased bilateral pulmonary opacities favored to reflect multifocal infection superimposed on a background of emphysema. Recommend follow-up chest radiograph in 4-6 weeks after treatment to ensure resolution. Electronically Signed   By: Valentino Saxon M.D.   On: 03/10/2022 11:07    Microbiology: No results found for this or any previous visit (from the past 240 hour(s)).   Labs: Basic Metabolic Panel: No results for input(s): "NA", "K", "CL", "CO2", "GLUCOSE", "BUN", "CREATININE", "CALCIUM", "MG", "PHOS" in the last 168 hours. Liver Function Tests: No results for input(s): "AST", "ALT", "ALKPHOS", "BILITOT", "PROT", "ALBUMIN" in the last 168 hours. No results for input(s): "LIPASE", "AMYLASE" in the last 168 hours. No results for input(s): "AMMONIA" in the last 168 hours. CBC: No results for input(s): "WBC", "NEUTROABS", "HGB", "HCT", "MCV", "PLT" in the last 168 hours. Cardiac Enzymes: No results for input(s): "CKTOTAL", "CKMB", "CKMBINDEX", "TROPONINI" in the last 168 hours. BNP: BNP (last 3 results) Recent Labs    03/23/2022 1149  BNP 463.6*    ProBNP (last 3 results) Recent Labs    03/10/22 0956  PROBNP 679.0*    CBG: No results for input(s): "GLUCAP" in the last 168 hours.     Signed: 04/17/22 patient is deceased as of [place] hrs  Dia Crawford, MD [facility]

## 2022-03-31 NOTE — Progress Notes (Signed)
    OVERNIGHT PROGRESS REPORT  Notified by RN that patient is deceased as of 0552 hrs  Patient was DNR/CMO (comfort measures only)  2 RN verified.  Family notified by Nursing.     Gershon Cull MSNA ACNPC-AG Acute Care Nurse Practitioner [place]

## 2022-03-31 DEATH — deceased

## 2022-04-07 ENCOUNTER — Ambulatory Visit: Payer: Medicare Other | Admitting: Internal Medicine

## 2022-07-06 ENCOUNTER — Ambulatory Visit: Payer: Medicare Other | Admitting: Cardiology

## 2022-07-14 ENCOUNTER — Ambulatory Visit: Payer: Medicare Other | Admitting: "Endocrinology

## 2022-10-07 IMAGING — DX DG CHEST 2V
2 series · 2 of 2 positions shown · non-contrast
Comparison: November 27, 2017.

CLINICAL DATA: Pneumonia.

EXAM:
CHEST - 2 VIEW

[chest pa]
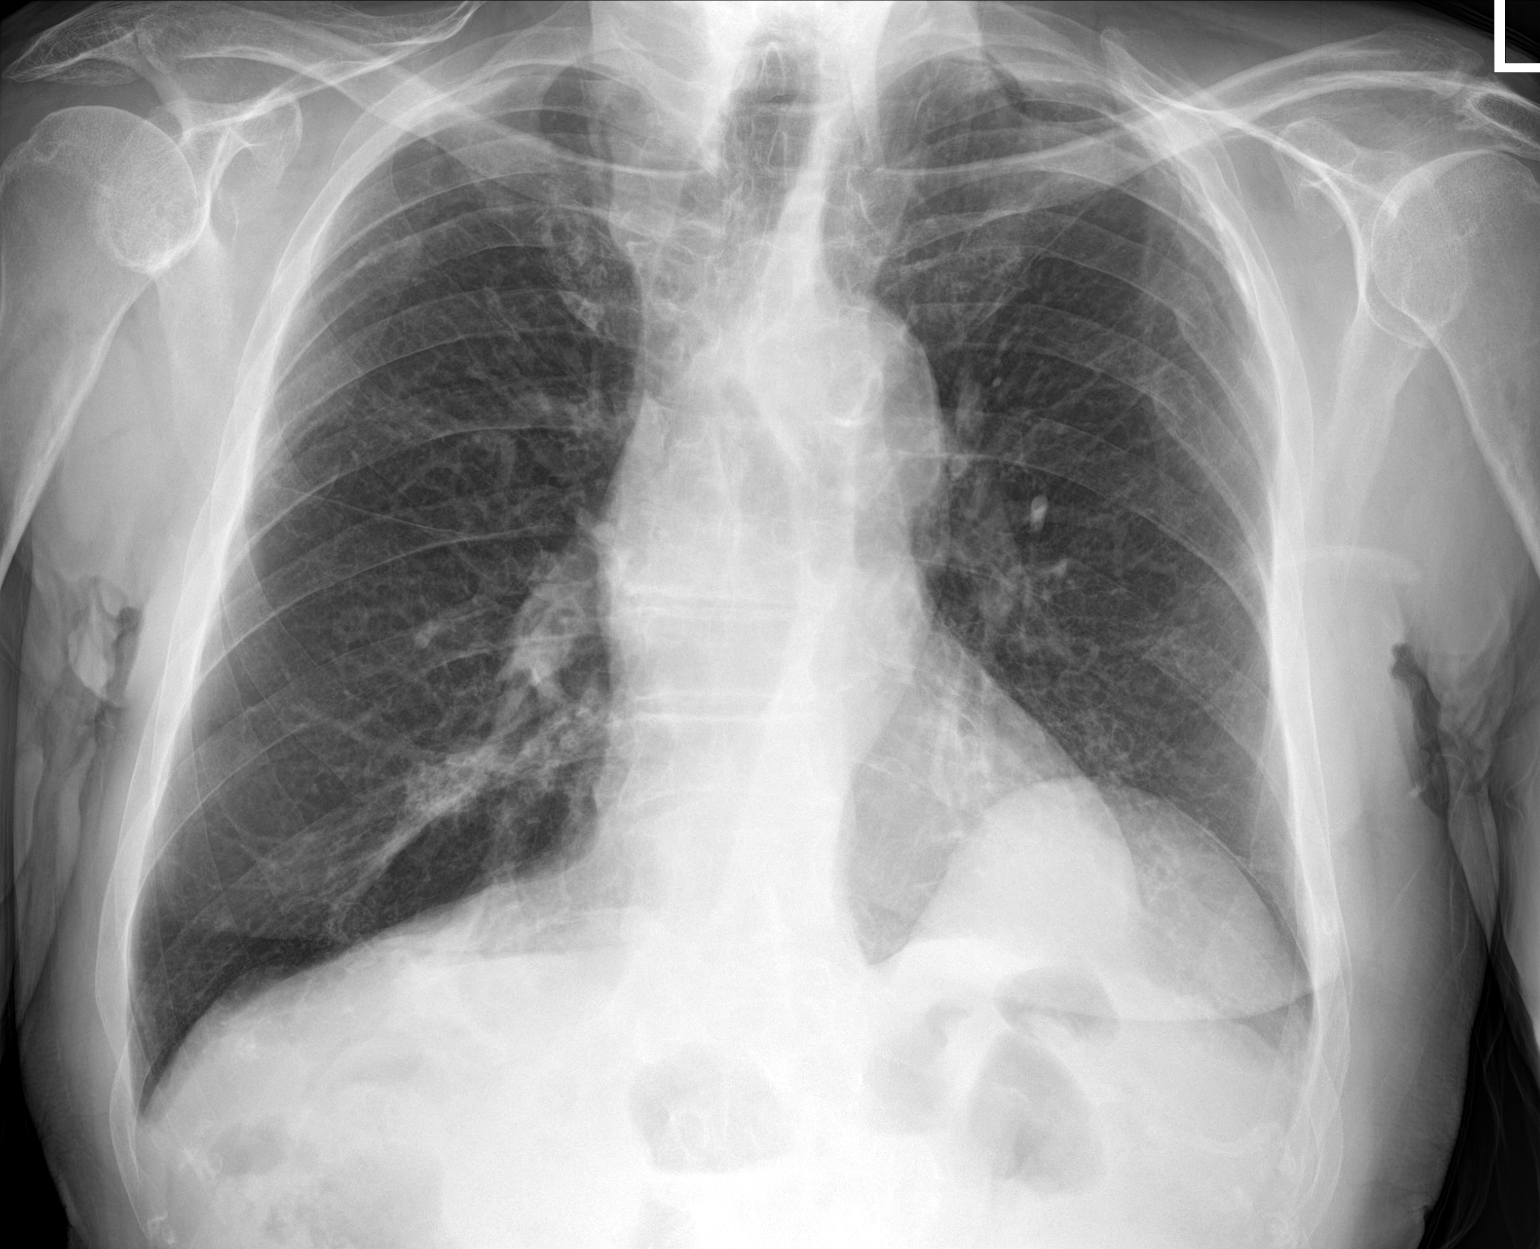

[chest lat]
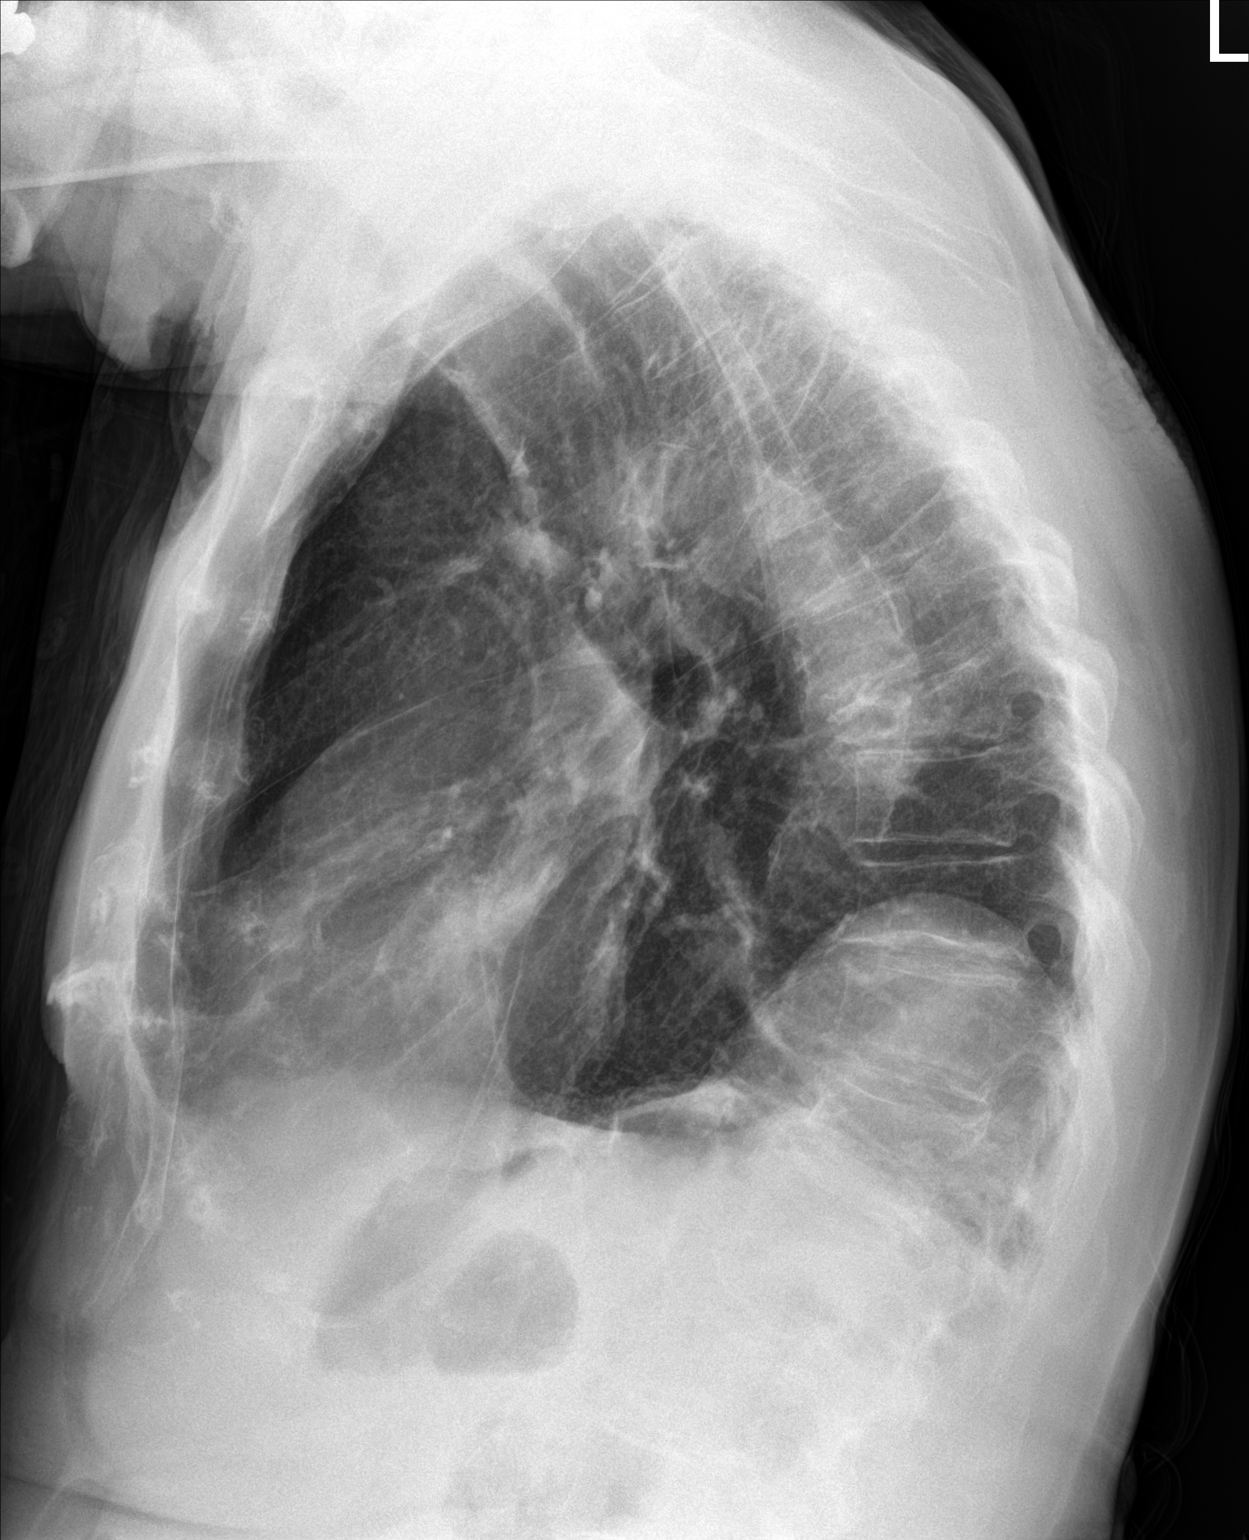

[2 of 2 positions shown; findings below may reference images not displayed]

FINDINGS: The heart size and mediastinal contours are within normal limits. No
pneumothorax or pleural effusion is noted. Eventration is seen
involving the posterior portion of the left hemidiaphragm. Old left
rib fractures are noted. Left lung is clear. Rounded density is seen
in right lower lobe concerning for possible neoplasm.
IMPRESSION: Rounded density seen in right lower lobe concerning for possible
neoplasm. CT scan of the chest with intravenous contrast is
recommended for further evaluation. These results will be called to
the ordering clinician or representative by the Radiologist
Assistant, and communication documented in the PACS or z[employer]
Dashboard.
